# Patient Record
Sex: Male | Born: 1985
Health system: Southern US, Academic
[De-identification: ages and names within clinical notes are randomized; demographics above are authoritative.]

## PROBLEM LIST (undated history)

## (undated) ENCOUNTER — Encounter

## (undated) ENCOUNTER — Telehealth

## (undated) ENCOUNTER — Ambulatory Visit: Payer: PRIVATE HEALTH INSURANCE | Attending: "Endocrinology | Primary: "Endocrinology

## (undated) ENCOUNTER — Ambulatory Visit: Payer: PRIVATE HEALTH INSURANCE

## (undated) ENCOUNTER — Ambulatory Visit

## (undated) ENCOUNTER — Ambulatory Visit: Payer: PRIVATE HEALTH INSURANCE | Attending: Registered" | Primary: Registered"

## (undated) ENCOUNTER — Ambulatory Visit: Attending: Pharmacist | Primary: Pharmacist

## (undated) ENCOUNTER — Ambulatory Visit: Payer: PRIVATE HEALTH INSURANCE | Attending: Hematology | Primary: Hematology

## (undated) ENCOUNTER — Encounter: Attending: Pulmonary Disease | Primary: Pulmonary Disease

## (undated) ENCOUNTER — Encounter: Attending: Pharmacist | Primary: Pharmacist

## (undated) ENCOUNTER — Encounter: Attending: Medical | Primary: Medical

## (undated) ENCOUNTER — Telehealth: Attending: Internal Medicine | Primary: Internal Medicine

## (undated) ENCOUNTER — Ambulatory Visit: Attending: Medical | Primary: Medical

## (undated) ENCOUNTER — Ambulatory Visit: Payer: PRIVATE HEALTH INSURANCE | Attending: Medical | Primary: Medical

## (undated) ENCOUNTER — Encounter: Attending: "Endocrinology | Primary: "Endocrinology

## (undated) ENCOUNTER — Telehealth: Attending: Pharmacist | Primary: Pharmacist

## (undated) ENCOUNTER — Ambulatory Visit: Payer: PRIVATE HEALTH INSURANCE | Attending: Pharmacist | Primary: Pharmacist

## (undated) ENCOUNTER — Telehealth
Attending: Student in an Organized Health Care Education/Training Program | Primary: Student in an Organized Health Care Education/Training Program

## (undated) ENCOUNTER — Ambulatory Visit: Attending: "Endocrinology | Primary: "Endocrinology

## (undated) ENCOUNTER — Encounter: Attending: Registered" | Primary: Registered"

## (undated) ENCOUNTER — Telehealth: Attending: Medical | Primary: Medical

## (undated) ENCOUNTER — Telehealth: Attending: Registered" | Primary: Registered"

## (undated) ENCOUNTER — Ambulatory Visit: Payer: PRIVATE HEALTH INSURANCE | Attending: Pulmonary Disease | Primary: Pulmonary Disease

## (undated) ENCOUNTER — Encounter
Attending: Student in an Organized Health Care Education/Training Program | Primary: Student in an Organized Health Care Education/Training Program

## (undated) ENCOUNTER — Ambulatory Visit
Payer: PRIVATE HEALTH INSURANCE | Attending: Student in an Organized Health Care Education/Training Program | Primary: Student in an Organized Health Care Education/Training Program

## (undated) ENCOUNTER — Telehealth: Payer: PRIVATE HEALTH INSURANCE | Attending: "Endocrinology | Primary: "Endocrinology

## (undated) ENCOUNTER — Encounter: Attending: Internal Medicine | Primary: Internal Medicine

## (undated) ENCOUNTER — Institutional Professional Consult (permissible substitution): Payer: PRIVATE HEALTH INSURANCE

## (undated) ENCOUNTER — Ambulatory Visit: Payer: PRIVATE HEALTH INSURANCE | Attending: Family | Primary: Family

## (undated) ENCOUNTER — Inpatient Hospital Stay

## (undated) ENCOUNTER — Ambulatory Visit: Payer: PRIVATE HEALTH INSURANCE | Attending: Nutritionist | Primary: Nutritionist

## (undated) ENCOUNTER — Telehealth: Attending: Pulmonary Disease | Primary: Pulmonary Disease

## (undated) ENCOUNTER — Ambulatory Visit: Payer: PRIVATE HEALTH INSURANCE | Attending: Audiologist | Primary: Audiologist

## (undated) ENCOUNTER — Telehealth: Attending: Primary Care | Primary: Primary Care

## (undated) ENCOUNTER — Ambulatory Visit: Payer: PRIVATE HEALTH INSURANCE | Attending: Dermatology | Primary: Dermatology

## (undated) DIAGNOSIS — E119 Type 2 diabetes mellitus without complications: Secondary | ICD-10-CM

## (undated) DIAGNOSIS — J45909 Unspecified asthma, uncomplicated: Secondary | ICD-10-CM

## (undated) DIAGNOSIS — Z86718 Personal history of other venous thrombosis and embolism: Secondary | ICD-10-CM

## (undated) DIAGNOSIS — K8689 Other specified diseases of pancreas: Secondary | ICD-10-CM

## (undated) DIAGNOSIS — G4730 Sleep apnea, unspecified: Secondary | ICD-10-CM

## (undated) DIAGNOSIS — J189 Pneumonia, unspecified organism: Secondary | ICD-10-CM

## (undated) DIAGNOSIS — I10 Essential (primary) hypertension: Secondary | ICD-10-CM

## (undated) DIAGNOSIS — Z87442 Personal history of urinary calculi: Secondary | ICD-10-CM

## (undated) HISTORY — PX: NASAL SINUS SURGERY: SHX719

## (undated) MED ORDER — HYDROCODONE 5 MG-ACETAMINOPHEN 325 MG TABLET: Freq: Four times a day (QID) | ORAL | 0.00000 days | PRN

## (undated) MED ORDER — MUPIROCIN 2 % TOPICAL OINTMENT: Freq: Three times a day (TID) | TOPICAL | 0 days | Status: SS

## (undated) MED ORDER — OMEPRAZOLE 20 MG CAPSULE,DELAYED RELEASE: Freq: Every day | ORAL | 0.00000 days | Status: SS

## (undated) MED ORDER — GABAPENTIN ER 600 MG TABLET,EXTENDED RELEASE 24 HR: Freq: Every day | ORAL | 0.00000 days | Status: SS

## (undated) MED ORDER — BLOOD GLUCOSE TEST STRIPS: Freq: Three times a day (TID) | 0 days

## (undated) MED ORDER — METHOCARBAMOL 750 MG TABLET: 0.00000 days

## (undated) MED ORDER — GABAPENTIN 600 MG TABLET: Freq: Three times a day (TID) | ORAL | 0.00000 days

## (undated) MED ORDER — OXYCODONE 5 MG CAPSULE: ORAL | 0.00000 days | PRN

## (undated) MED ORDER — DOCUSATE SODIUM 100 MG CAPSULE: Freq: Every day | ORAL | 0 days

## (undated) MED ORDER — POLYETHYLENE GLYCOL 3350 17 GRAM ORAL POWDER PACKET: Freq: Every day | ORAL | 0.00000 days

---
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
## 2016-03-16 DIAGNOSIS — F329 Major depressive disorder, single episode, unspecified: Secondary | ICD-10-CM

## 2016-03-16 DIAGNOSIS — I10 Essential (primary) hypertension: Secondary | ICD-10-CM

## 2016-03-16 DIAGNOSIS — E109 Type 1 diabetes mellitus without complications: Secondary | ICD-10-CM

## 2016-03-16 DIAGNOSIS — Z86718 Personal history of other venous thrombosis and embolism: Secondary | ICD-10-CM

## 2016-03-16 DIAGNOSIS — F419 Anxiety disorder, unspecified: Secondary | ICD-10-CM

## 2016-03-17 DIAGNOSIS — E669 Obesity, unspecified: Secondary | ICD-10-CM

## 2016-03-17 DIAGNOSIS — E109 Type 1 diabetes mellitus without complications: Secondary | ICD-10-CM

## 2016-03-17 DIAGNOSIS — Z86718 Personal history of other venous thrombosis and embolism: Secondary | ICD-10-CM

## 2016-07-28 ENCOUNTER — Encounter (HOSPITAL_COMMUNITY): Payer: Self-pay | Admitting: *Deleted

## 2016-07-28 ENCOUNTER — Emergency Department (HOSPITAL_COMMUNITY): Payer: BLUE CROSS/BLUE SHIELD

## 2016-07-28 ENCOUNTER — Inpatient Hospital Stay (HOSPITAL_COMMUNITY)
Admission: EM | Admit: 2016-07-28 | Discharge: 2016-07-31 | DRG: 178 | Disposition: A | Discharge status: Home Health | Payer: BLUE CROSS/BLUE SHIELD | Attending: Internal Medicine | Admitting: Internal Medicine

## 2016-07-28 ENCOUNTER — Other Ambulatory Visit: Payer: Self-pay

## 2016-07-28 DIAGNOSIS — R52 Pain, unspecified: Secondary | ICD-10-CM

## 2016-07-28 DIAGNOSIS — J471 Bronchiectasis with (acute) exacerbation: Secondary | ICD-10-CM | POA: Diagnosis present

## 2016-07-28 DIAGNOSIS — Z881 Allergy status to other antibiotic agents status: Secondary | ICD-10-CM

## 2016-07-28 DIAGNOSIS — Z7901 Long term (current) use of anticoagulants: Secondary | ICD-10-CM

## 2016-07-28 DIAGNOSIS — I10 Essential (primary) hypertension: Secondary | ICD-10-CM | POA: Diagnosis present

## 2016-07-28 DIAGNOSIS — Z86718 Personal history of other venous thrombosis and embolism: Secondary | ICD-10-CM

## 2016-07-28 DIAGNOSIS — R739 Hyperglycemia, unspecified: Secondary | ICD-10-CM

## 2016-07-28 DIAGNOSIS — Z888 Allergy status to other drugs, medicaments and biological substances status: Secondary | ICD-10-CM

## 2016-07-28 DIAGNOSIS — E119 Type 2 diabetes mellitus without complications: Secondary | ICD-10-CM | POA: Diagnosis present

## 2016-07-28 DIAGNOSIS — G2581 Restless legs syndrome: Secondary | ICD-10-CM | POA: Diagnosis present

## 2016-07-28 DIAGNOSIS — Z91018 Allergy to other foods: Secondary | ICD-10-CM

## 2016-07-28 DIAGNOSIS — F329 Major depressive disorder, single episode, unspecified: Secondary | ICD-10-CM | POA: Diagnosis present

## 2016-07-28 LAB — CBC WITH DIFFERENTIAL/PLATELET
Basophils Absolute: 0 10*3/uL (ref 0.0–0.1)
Basophils Relative: 1 %
Eosinophils Absolute: 0.4 10*3/uL (ref 0.0–0.7)
Eosinophils Relative: 5 %
HEMATOCRIT: 41.5 % (ref 39.0–52.0)
Hemoglobin: 13.5 g/dL (ref 13.0–17.0)
LYMPHS PCT: 29 %
Lymphs Abs: 2.3 10*3/uL (ref 0.7–4.0)
MCH: 26 pg (ref 26.0–34.0)
MCHC: 32.5 g/dL (ref 30.0–36.0)
MCV: 79.8 fL (ref 78.0–100.0)
MONO ABS: 0.9 10*3/uL (ref 0.1–1.0)
MONOS PCT: 11 %
NEUTROS ABS: 4.2 10*3/uL (ref 1.7–7.7)
Neutrophils Relative %: 54 %
Platelets: 383 10*3/uL (ref 150–400)
RBC: 5.2 MIL/uL (ref 4.22–5.81)
RDW: 15.7 % — AB (ref 11.5–15.5)
WBC: 7.7 10*3/uL (ref 4.0–10.5)

## 2016-07-28 LAB — BASIC METABOLIC PANEL
Anion gap: 7 (ref 5–15)
BUN: 13 mg/dL (ref 6–20)
CALCIUM: 9.1 mg/dL (ref 8.9–10.3)
CO2: 24 mmol/L (ref 22–32)
CREATININE: 0.87 mg/dL (ref 0.61–1.24)
Chloride: 105 mmol/L (ref 101–111)
GFR calc Af Amer: >60 mL/min (ref >60)
GFR calc non Af Amer: >60 mL/min (ref >60)
GLUCOSE: 146 mg/dL — AB (ref 65–99)
Potassium: 4.2 mmol/L (ref 3.5–5.1)
Sodium: 136 mmol/L (ref 135–145)

## 2016-07-28 MED ORDER — ALBUTEROL SULFATE (2.5 MG/3ML) 0.083% IN NEBU
INHALATION_SOLUTION | RESPIRATORY_TRACT | Status: AC
  Filled 2016-07-28: qty 3

## 2016-07-28 MED ORDER — ALBUTEROL SULFATE (2.5 MG/3ML) 0.083% IN NEBU
5.0000 mg | INHALATION_SOLUTION | Freq: Once | RESPIRATORY_TRACT | Status: AC
  Administered 2016-07-28: 5 mg via RESPIRATORY_TRACT

## 2016-07-28 NOTE — ED Triage Notes (Signed)
Pt with hx of cystic fibrosis (dx at age 30) is here for URI with productive cough and sob.  Pt was seen by PCP and placed on augmentin, pt has been taking the augmentin for over a week and is not feeling better.  No acute distress in triage, speaking in full sentences.

## 2016-07-29 DIAGNOSIS — R739 Hyperglycemia, unspecified: Secondary | ICD-10-CM

## 2016-07-29 DIAGNOSIS — F329 Major depressive disorder, single episode, unspecified: Secondary | ICD-10-CM | POA: Diagnosis present

## 2016-07-29 DIAGNOSIS — Z91018 Allergy to other foods: Secondary | ICD-10-CM | POA: Diagnosis not present

## 2016-07-29 DIAGNOSIS — J471 Bronchiectasis with (acute) exacerbation: Secondary | ICD-10-CM | POA: Diagnosis present

## 2016-07-29 DIAGNOSIS — G2581 Restless legs syndrome: Secondary | ICD-10-CM | POA: Diagnosis present

## 2016-07-29 DIAGNOSIS — I10 Essential (primary) hypertension: Secondary | ICD-10-CM | POA: Diagnosis present

## 2016-07-29 DIAGNOSIS — R52 Pain, unspecified: Secondary | ICD-10-CM

## 2016-07-29 DIAGNOSIS — Z86718 Personal history of other venous thrombosis and embolism: Secondary | ICD-10-CM | POA: Diagnosis not present

## 2016-07-29 DIAGNOSIS — Z7901 Long term (current) use of anticoagulants: Secondary | ICD-10-CM | POA: Diagnosis not present

## 2016-07-29 DIAGNOSIS — Z881 Allergy status to other antibiotic agents status: Secondary | ICD-10-CM | POA: Diagnosis not present

## 2016-07-29 DIAGNOSIS — Z888 Allergy status to other drugs, medicaments and biological substances status: Secondary | ICD-10-CM | POA: Diagnosis not present

## 2016-07-29 DIAGNOSIS — E119 Type 2 diabetes mellitus without complications: Secondary | ICD-10-CM | POA: Diagnosis present

## 2016-07-29 LAB — EXPECTORATED SPUTUM ASSESSMENT W GRAM STAIN, RFLX TO RESP C

## 2016-07-29 LAB — EXPECTORATED SPUTUM ASSESSMENT W REFEX TO RESP CULTURE

## 2016-07-29 LAB — GLUCOSE, CAPILLARY: Glucose-Capillary: 108 mg/dL — ABNORMAL HIGH (ref 65–99)

## 2016-07-29 LAB — TOBRAMYCIN LEVEL, RANDOM
TOBRAMYCIN RM: 16.8 ug/mL — AB
Tobramycin Rm: 5.6 ug/mL

## 2016-07-29 MED ORDER — ONDANSETRON HCL 4 MG PO TABS
8.0000 mg | ORAL_TABLET | Freq: Two times a day (BID) | ORAL | Status: DC | PRN
  Administered 2016-07-29: 8 mg via ORAL
  Filled 2016-07-29: qty 2

## 2016-07-29 MED ORDER — INSULIN DETEMIR 100 UNIT/ML ~~LOC~~ SOLN
5.0000 [IU] | Freq: Every day | SUBCUTANEOUS | Status: DC
  Administered 2016-07-29 – 2016-07-31 (×3): 5 [IU] via SUBCUTANEOUS
  Filled 2016-07-29 (×3): qty 0.05

## 2016-07-29 MED ORDER — CLONIDINE HCL 0.1 MG PO TABS
0.1000 mg | ORAL_TABLET | Freq: Two times a day (BID) | ORAL | Status: DC
  Administered 2016-07-30 – 2016-07-31 (×4): 0.1 mg via ORAL
  Filled 2016-07-29 (×4): qty 1

## 2016-07-29 MED ORDER — SODIUM CHLORIDE 0.9 % IV SOLN
1.0000 g | Freq: Three times a day (TID) | INTRAVENOUS | Status: DC
  Filled 2016-07-29 (×2): qty 1

## 2016-07-29 MED ORDER — PANTOPRAZOLE SODIUM 40 MG PO TBEC
40.0000 mg | DELAYED_RELEASE_TABLET | Freq: Every day | ORAL | Status: DC
  Administered 2016-07-29 – 2016-07-31 (×3): 40 mg via ORAL
  Filled 2016-07-29 (×3): qty 1

## 2016-07-29 MED ORDER — PANCRELIPASE (LIP-PROT-AMYL) 36000-114000 UNITS PO CPEP
240000.0000 [IU] | ORAL_CAPSULE | Freq: Three times a day (TID) | ORAL | Status: DC
  Administered 2016-07-29 – 2016-07-31 (×6): 240000 [IU] via ORAL
  Filled 2016-07-29 (×9): qty 2

## 2016-07-29 MED ORDER — LORATADINE 10 MG PO TABS
10.0000 mg | ORAL_TABLET | Freq: Every day | ORAL | Status: DC
  Administered 2016-07-29 – 2016-07-31 (×3): 10 mg via ORAL
  Filled 2016-07-29 (×3): qty 1

## 2016-07-29 MED ORDER — PANCRELIPASE (LIP-PROT-AMYL) 12000-38000 UNITS PO CPEP: 108000.0000 [IU] | ORAL_CAPSULE | ORAL | Status: DC | PRN

## 2016-07-29 MED ORDER — PANCRELIPASE (LIP-PROT-AMYL) 12000-38000 UNITS PO CPEP: 120000.0000 [IU] | ORAL_CAPSULE | ORAL | Status: DC | PRN

## 2016-07-29 MED ORDER — LISINOPRIL 5 MG PO TABS
5.0000 mg | ORAL_TABLET | Freq: Every day | ORAL | Status: DC
  Administered 2016-07-29: 5 mg via ORAL
  Filled 2016-07-29: qty 1

## 2016-07-29 MED ORDER — PREGABALIN 75 MG PO CAPS
75.0000 mg | ORAL_CAPSULE | Freq: Every day | ORAL | Status: DC
  Administered 2016-07-29 – 2016-07-31 (×3): 75 mg via ORAL
  Filled 2016-07-29 (×3): qty 1

## 2016-07-29 MED ORDER — DEXTROSE 5 % IV SOLN
830.0000 mg | INTRAVENOUS | Status: DC
  Administered 2016-07-30 – 2016-07-31 (×2): 830 mg via INTRAVENOUS
  Filled 2016-07-29 (×3): qty 20.75

## 2016-07-29 MED ORDER — RIVAROXABAN 20 MG PO TABS
20.0000 mg | ORAL_TABLET | Freq: Every day | ORAL | Status: DC
  Administered 2016-07-29 – 2016-07-30 (×2): 20 mg via ORAL
  Filled 2016-07-29 (×2): qty 1

## 2016-07-29 MED ORDER — MOMETASONE FURO-FORMOTEROL FUM 200-5 MCG/ACT IN AERO
2.0000 | INHALATION_SPRAY | Freq: Two times a day (BID) | RESPIRATORY_TRACT | Status: DC
  Administered 2016-07-29 – 2016-07-30 (×4): 2 via RESPIRATORY_TRACT
  Filled 2016-07-29: qty 8.8

## 2016-07-29 MED ORDER — ALBUTEROL SULFATE (2.5 MG/3ML) 0.083% IN NEBU
INHALATION_SOLUTION | RESPIRATORY_TRACT | Status: AC
  Administered 2016-07-29: 2.5 mg
  Filled 2016-07-29: qty 3

## 2016-07-29 MED ORDER — TOBRAMYCIN 300 MG/5ML IN NEBU
300.0000 mg | INHALATION_SOLUTION | Freq: Two times a day (BID) | RESPIRATORY_TRACT | Status: DC
  Administered 2016-07-29: 300 mg via RESPIRATORY_TRACT
  Filled 2016-07-29 (×3): qty 5

## 2016-07-29 MED ORDER — SODIUM CHLORIDE 0.9 % IV SOLN
2.0000 g | Freq: Three times a day (TID) | INTRAVENOUS | Status: DC
  Administered 2016-07-29 – 2016-07-31 (×7): 2 g via INTRAVENOUS
  Filled 2016-07-29 (×9): qty 2

## 2016-07-29 MED ORDER — IVACAFTOR 150 MG PO TABS
150.0000 mg | ORAL_TABLET | Freq: Two times a day (BID) | ORAL | Status: DC
Start: 2016-07-29 — End: 2016-07-31
  Administered 2016-07-29 – 2016-07-30 (×3): 150 mg via ORAL
  Filled 2016-07-29 (×5): qty 1

## 2016-07-29 MED ORDER — HYDROMORPHONE HCL 2 MG/ML IJ SOLN
0.5000 mg | Freq: Once | INTRAMUSCULAR | Status: AC
  Administered 2016-07-29: 0.5 mg via INTRAVENOUS
  Filled 2016-07-29: qty 1

## 2016-07-29 MED ORDER — IBUPROFEN 400 MG PO TABS
400.0000 mg | ORAL_TABLET | ORAL | Status: DC | PRN
  Administered 2016-07-29: 400 mg via ORAL
  Filled 2016-07-29: qty 1

## 2016-07-29 MED ORDER — VITAMIN D 1000 UNITS PO TABS
4000.0000 [IU] | ORAL_TABLET | Freq: Every day | ORAL | Status: DC
  Administered 2016-07-29 – 2016-07-31 (×3): 4000 [IU] via ORAL
  Filled 2016-07-29 (×3): qty 4

## 2016-07-29 MED ORDER — SODIUM CHLORIDE 7 % IN NEBU
4.0000 mL | INHALATION_SOLUTION | Freq: Two times a day (BID) | RESPIRATORY_TRACT | Status: DC
  Administered 2016-07-29: 20:00:00 via RESPIRATORY_TRACT
  Administered 2016-07-29 – 2016-07-31 (×4): 4 mL via RESPIRATORY_TRACT
  Filled 2016-07-29 (×4): qty 4

## 2016-07-29 MED ORDER — ACETAMINOPHEN-CODEINE #3 300-30 MG PO TABS
1.0000 | ORAL_TABLET | ORAL | Status: DC | PRN
  Administered 2016-07-30 (×3): 2 via ORAL
  Administered 2016-07-30: 1 via ORAL
  Filled 2016-07-29 (×4): qty 2

## 2016-07-29 MED ORDER — ALBUTEROL SULFATE (2.5 MG/3ML) 0.083% IN NEBU
INHALATION_SOLUTION | RESPIRATORY_TRACT | Status: AC
  Filled 2016-07-29: qty 3

## 2016-07-29 MED ORDER — TRAMADOL HCL 50 MG PO TABS
100.0000 mg | ORAL_TABLET | Freq: Three times a day (TID) | ORAL | Status: DC | PRN
  Administered 2016-07-29: 100 mg via ORAL
  Filled 2016-07-29: qty 2

## 2016-07-29 MED ORDER — AZITHROMYCIN 500 MG PO TABS: 500.0000 mg | ORAL_TABLET | ORAL | Status: DC

## 2016-07-29 MED ORDER — TRAZODONE HCL 100 MG PO TABS
100.0000 mg | ORAL_TABLET | Freq: Every evening | ORAL | Status: DC
  Administered 2016-07-29 – 2016-07-30 (×2): 100 mg via ORAL
  Filled 2016-07-29 (×2): qty 1

## 2016-07-29 MED ORDER — MONTELUKAST SODIUM 10 MG PO TABS
10.0000 mg | ORAL_TABLET | Freq: Every day | ORAL | Status: DC
  Administered 2016-07-29 – 2016-07-31 (×3): 10 mg via ORAL
  Filled 2016-07-29 (×3): qty 1

## 2016-07-29 MED ORDER — PAROXETINE HCL 20 MG PO TABS
10.0000 mg | ORAL_TABLET | Freq: Every day | ORAL | Status: DC
  Administered 2016-07-29 – 2016-07-31 (×3): 10 mg via ORAL
  Filled 2016-07-29 (×3): qty 1

## 2016-07-29 MED ORDER — TOBRAMYCIN SULFATE 80 MG/2ML IJ SOLN
930.0000 mg | Freq: Once | INTRAVENOUS | Status: AC
  Administered 2016-07-29: 930 mg via INTRAVENOUS
  Filled 2016-07-29: qty 23.25

## 2016-07-29 MED ORDER — PANCRELIPASE (LIP-PROT-AMYL) 12000-38000 UNITS PO CPEP: 12000.0000 [IU] | ORAL_CAPSULE | ORAL | Status: DC | PRN

## 2016-07-29 MED ORDER — PANCRELIPASE (LIP-PROT-AMYL) 40000-136000 UNITS PO CPEP: 6.0000 | ORAL_CAPSULE | Freq: Three times a day (TID) | ORAL | Status: DC | PRN

## 2016-07-29 MED ORDER — DORNASE ALFA 2.5 MG/2.5ML IN SOLN
2.5000 mg | Freq: Every day | RESPIRATORY_TRACT | Status: DC
  Administered 2016-07-29 – 2016-07-30 (×2): 2.5 mg via RESPIRATORY_TRACT
  Filled 2016-07-29 (×6): qty 2.5

## 2016-07-29 MED ORDER — ALBUTEROL SULFATE (2.5 MG/3ML) 0.083% IN NEBU
1.2500 mg | INHALATION_SOLUTION | Freq: Two times a day (BID) | RESPIRATORY_TRACT | Status: DC
  Administered 2016-07-29: 1.25 mg via RESPIRATORY_TRACT

## 2016-07-29 MED ORDER — ALBUTEROL SULFATE (2.5 MG/3ML) 0.083% IN NEBU: 1.2500 mg | INHALATION_SOLUTION | Freq: Two times a day (BID) | RESPIRATORY_TRACT | Status: DC

## 2016-07-29 MED ORDER — PRAMIPEXOLE DIHYDROCHLORIDE 0.125 MG PO TABS
0.1250 mg | ORAL_TABLET | Freq: Every evening | ORAL | Status: DC
  Administered 2016-07-29 – 2016-07-30 (×2): 0.125 mg via ORAL
  Filled 2016-07-29 (×3): qty 1

## 2016-07-29 MED ORDER — IVACAFTOR 150 MG PO TABS
150.0000 mg | ORAL_TABLET | Freq: Two times a day (BID) | ORAL | Status: DC
Start: 2016-07-29 — End: 2016-07-29

## 2016-07-29 NOTE — Progress Notes (Signed)
CRITICAL VALUE ALERT  Critical value received:  Tobramycin  Date of notification:  07/29/16  Time of notification:  1315  Critical value read back:Yes.    Nurse who received alert:  Erick AlleySarah Tashema Tiller RN  MD notified (1st page):  Young     Per MD Pharmacy is taking care of this.

## 2016-07-29 NOTE — Progress Notes (Signed)
eLink Physician-Brief Progress Note Patient Name: James Proctor DOB: October 12, 1985 MRN: [id number]   Date of Service  07/29/2016  HPI/Events of Note  Patient c/o total body pain. Creatinine = 0.87.  eICU Interventions  Will order: 1. Motrin 400 mg PO now and Q 4 hours PRN pain.      Intervention Category Intermediate Interventions: Pain - evaluation and management  [name] 07/29/2016, 4:43 AM

## 2016-07-29 NOTE — Progress Notes (Signed)
PULMONARY / CRITICAL CARE MEDICINE   Name: James Proctor MRN: [id number] DOB: 12/06/1985    ADMISSION DATE:  07/28/2016  CHIEF COMPLAINT:  CF exacerbation  HISTORY OF PRESENT ILLNESS:   Mr. James Proctor is a 30 y/o M with cystic fibrosis (3905insT and S945L) on ivacaftor who presented to the ED with several days of worsening chest congestion, cough, and fatigue. His baseline FEV1 is ~80%.  His primary CF center is [facility], but explained that he typically presents to a local ED and then has [facility] coordinate his care with the local center for discharge on home antibiotics for his typical exacerbations. In addition to CF, he states he is on life-long anticoagulation for recurrent DVTs.   Allergies  Allergen Reactions  . [place] [Aztreonam] Anaphylaxis  . Cefepime Itching, Nausea Only and Other (See Comments)    Headaches also  . Tobramycin Tinitus    [facility] since this allergy documented  . Banana Itching and Nausea And Vomiting  . Theophyllines Itching    Itchy throat   Subjective: Complains of tussive chest wall pains, and left thigh pain after fall last week. Says motrin insufficient, uses Lyrica at home w dose pending, and asks what can be taken if Lyrica insufficient.  VITAL SIGNS: BP 119/66 (BP Location: Right Arm)   Pulse 78   Temp 98.5 F (36.9 C) (Oral)   Resp 18   Ht 6' (1.829 m)   Wt 113.4 kg (250 lb 1.6 oz)   SpO2 99%   BMI 33.92 kg/m   INTAKE / OUTPUT: No intake/output data recorded.  PHYSICAL EXAMINATION: General:  Young man in NAD, cooperative Neuro:  Awake, alert, follows commands, speech clear HEENT:  MMM Cardiovascular:  Normal S1/S2 Lungs:  Rhonchi on inspiration mild. Not coughing at my exam, unlabored on room air. R portacath Abdomen:  Soft Musculoskeletal:  No deformities Skin:  No rashes on visible skin  LABS:  BMET  Recent Labs Lab 07/28/16 2003  NA 136  K 4.2  CL 105  CO2 24  BUN 13  CREATININE 0.87   GLUCOSE 146*    Electrolytes  Recent Labs Lab 07/28/16 2003  CALCIUM 9.1    CBC  Recent Labs Lab 07/28/16 2003  WBC 7.7  HGB 13.5  HCT 41.5  PLT 383    Coag's No results for input(s): APTT, INR in the last 168 hours.  Sepsis Markers No results for input(s): LATICACIDVEN, PROCALCITON, O2SATVEN in the last 168 hours.  ABG No results for input(s): PHART, PCO2ART, PO2ART in the last 168 hours.  Liver Enzymes No results for input(s): AST, ALT, ALKPHOS, BILITOT, ALBUMIN in the last 168 hours.  Cardiac Enzymes No results for input(s): TROPONINI, PROBNP in the last 168 hours.  Glucose  Recent Labs Lab 07/29/16 0755  GLUCAP 108*    Imaging Dg Chest 2 View  Result Date: 07/28/2016 CLINICAL DATA:  Initial evaluation for acute shortness of breath, cough. History of cystic fibrosis. EXAM: CHEST  2 VIEW COMPARISON:  Prior radiograph from 03/16/2016. FINDINGS: Right-sided Port-A-Cath in place, stable. Cardiac and mediastinal silhouettes are unchanged. Lung volumes are mildly reduced. No pulmonary edema or pleural effusion. Architectural distortion with bronchiectasis at the medial right lung base is similar to prior, likely chronic. No other focal infiltrates identified. No pneumothorax. No acute osseous abnormality. No acute osseous abnormality. IMPRESSION: 1. Similar appearance of architectural distortion with bronchiectasis at the medial right upper lobe, likely chronic. 2. No other active cardiopulmonary disease identified.  Electronically Signed   By: Rise MuBenjamin  McClintock M.D.   On: 07/28/2016 20:46    ASSESSMENT / PLAN: From admission assessment: 1. Cystic Fibrosis Associated Bronchiectasis with current exacerbation: [facility], typically treated with meropenem and tobramycin for pseudomonas as well as Staph A. Will treat with these, and will coordinate with pharmacy for extended-interval (high peak) tobramycin. Will coordinate with [facility] on Tuesday to  arrange local IVs. The patient took his ivacaftor this evening, but I instructed his family member to retrieve this medication (not on hospital formulary) so he can continue it without interruptions. Hold azithro while on IV tobra. - PFTs. - continue home inhaled therapies (HS, dornase, etc) - Chest physiotherapy q4hr while awake  2. Pain management: He refers tosore L leg after fall last week, tussive chest pain, but is on Lyrica, indicating chronic pain issues. Need to watch this. Consider tramadol beyond Lyrica if more needed here.  3. Elevated initial blood glucose. For repeat labs.   Jamie KatoAaron Trimble, MD Pulmonary and Critical Care Medicine [facility] Pager: [phone]  07/29/2016, 9:30 AM

## 2016-07-29 NOTE — Progress Notes (Signed)
Patient refused albuterol treatment. Sputum cup left at bedside and patient instructed on use.

## 2016-07-29 NOTE — H&P (Signed)
PULMONARY / CRITICAL CARE MEDICINE   Name: James Proctor MRN: [id number] DOB: 1985-09-23    ADMISSION DATE:  07/28/2016  CHIEF COMPLAINT:  CF exacerbation  HISTORY OF PRESENT ILLNESS:   James Proctor is a 30 y/o M with cystic fibrosis (3905insT and S945L) on ivacaftor who presented to the ED with several days of worsening chest congestion, cough, and fatigue. His baseline FEV1 is ~80%.  His primary CF center is [facility], but explained that he typically presents to a local ED and then has [facility] coordinate his care with the local center for discharge on home antibiotics for his typical exacerbations. In addition to CF, he states he is on life-long anticoagulation for recurrent DVTs.  PAST MEDICAL HISTORY :  He  has no past medical history on file.  PAST SURGICAL HISTORY: He  has no past surgical history on file.  Allergies  Allergen Reactions  . [place] [Aztreonam] Anaphylaxis  . Cefepime Itching, Nausea Only and Other (See Comments)    Headaches also  . Tobramycin Tinitus  . Banana Itching and Nausea And Vomiting  . Theophyllines Itching    Itchy throat    No current facility-administered medications on file prior to encounter.    No current outpatient prescriptions on file prior to encounter.    FAMILY HISTORY:  His has no family status information on file.    SOCIAL HISTORY: He  reports that he has never smoked. He has never used smokeless tobacco. He reports that he does not drink alcohol.  REVIEW OF SYSTEMS:   Per HPI  VITAL SIGNS: BP 132/81 (BP Location: Right Arm)   Pulse 92   Temp 98.3 F (36.8 C) (Oral)   Resp 18   Wt 254 lb (115.2 kg)   SpO2 99%   INTAKE / OUTPUT: No intake/output data recorded.  PHYSICAL EXAMINATION: General:  Young man in NAD Neuro:  Awake, alert HEENT:  MMM Cardiovascular:  Normal S1/S2 Lungs:  Rhonchi on inspiration Abdomen:  Soft Musculoskeletal:  No deformities Skin:  No rashes on visible skin  LABS:  BMET  Recent  Labs Lab 07/28/16 2003  NA 136  K 4.2  CL 105  CO2 24  BUN 13  CREATININE 0.87  GLUCOSE 146*    Electrolytes  Recent Labs Lab 07/28/16 2003  CALCIUM 9.1    CBC  Recent Labs Lab 07/28/16 2003  WBC 7.7  HGB 13.5  HCT 41.5  PLT 383    Coag's No results for input(s): APTT, INR in the last 168 hours.  Sepsis Markers No results for input(s): LATICACIDVEN, PROCALCITON, O2SATVEN in the last 168 hours.  ABG No results for input(s): PHART, PCO2ART, PO2ART in the last 168 hours.  Liver Enzymes No results for input(s): AST, ALT, ALKPHOS, BILITOT, ALBUMIN in the last 168 hours.  Cardiac Enzymes No results for input(s): TROPONINI, PROBNP in the last 168 hours.  Glucose No results for input(s): GLUCAP in the last 168 hours.  Imaging Dg Chest 2 View  Result Date: 07/28/2016 CLINICAL DATA:  Initial evaluation for acute shortness of breath, cough. History of cystic fibrosis. EXAM: CHEST  2 VIEW COMPARISON:  Prior radiograph from 03/16/2016. FINDINGS: Right-sided Port-A-Cath in place, stable. Cardiac and mediastinal silhouettes are unchanged. Lung volumes are mildly reduced. No pulmonary edema or pleural effusion. Architectural distortion with bronchiectasis at the medial right lung base is similar to prior, likely chronic. No other focal infiltrates identified. No pneumothorax. No acute osseous abnormality. No acute osseous abnormality. IMPRESSION: 1. Similar appearance  of architectural distortion with bronchiectasis at the medial right upper lobe, likely chronic. 2. No other active cardiopulmonary disease identified. Electronically Signed   By: Rise MuBenjamin  McClintock M.D.   On: 07/28/2016 20:46    ASSESSMENT / PLAN:  1. Cystic Fibrosis Associated Bronchiectasis with current exacerbation: [facility], typically treated with meropenem and tobramycin for pseudomonas as well as Staph A. Will treat with these, and will coordinate with pharmacy for extended-interval  (high peak) tobramycin. Will coordinate with [facility] on Tuesday to arrange local IVs. The patient took his ivacaftor this evening, but I instructed his family member to retrieve this medication (not on hospital formulary) so he can continue it without interruptions. Hold azithro while on IV tobra. - PFTs. - continue home inhaled therapies (HS, dornase, etc) - Chest physiotherapy q4hr while awake   Jamie KatoAaron Jasilyn Holderman, MD Pulmonary and Critical Care Medicine [facility] Pager: [phone]  07/29/2016, 4:03 AM

## 2016-07-29 NOTE — ED Provider Notes (Signed)
[facility]-EMERGENCY DEPT Provider Note   CSN: [id number] Arrival date & time: 07/28/16  1932     History   Chief Complaint Chief Complaint  Patient presents with  . Shortness of Breath    HPI James Proctor is a 30 y.o. male.  Patient with a history of cystic fibrosis and HTN presents with SOB that is worse on exertion and CP with cough and breathing, cough productive of grayish/tan phlegm, fever with Tmax at home of 101 x 1 week. He was started on Augmentin at the time symptoms started but reports there has been no improvement. No vomiting, change in appetite. [place] and he [facility], last admission was June of this year.    The history is provided by the patient and the spouse. No language interpreter was used.    History reviewed. No pertinent past medical history.  There are no active problems to display for this patient.   History reviewed. No pertinent surgical history.     Home Medications    Prior to Admission medications   Not on File    Family History No family history on file.  Social History Social History  Substance Use Topics  . Smoking status: Never Smoker  . Smokeless tobacco: Never Used  . Alcohol use No     Allergies   Patient has no known allergies.   Review of Systems Review of Systems  Constitutional: Positive for chills and fever.  HENT: Negative.  Negative for congestion.   Respiratory: Positive for cough and shortness of breath.   Cardiovascular: Positive for chest pain.  Gastrointestinal: Negative.  Negative for nausea and vomiting.  Musculoskeletal: Negative.   Skin: Negative.  Negative for rash.  Neurological: Negative.  Negative for weakness.     Physical Exam Updated Vital Signs BP 159/87   Pulse 93   Temp 98.3 F (36.8 C) (Oral)   Resp 12   Wt 115.2 kg   SpO2 99%   Physical Exam  Constitutional: He is oriented to person, place, and time. He appears well-developed  and well-nourished.  HENT:  Head: Normocephalic.  Nose: Nose normal.  Voice hoarse.  Eyes: Conjunctivae are normal.  Neck: Normal range of motion. Neck supple.  Cardiovascular: Normal rate and regular rhythm.   Pulmonary/Chest: Effort normal.  Right greater than left rhonchi. Air movement to all fields. Normal effort.   Abdominal: Soft. Bowel sounds are normal. There is no tenderness. There is no rebound and no guarding.  Musculoskeletal: Normal range of motion.  Neurological: He is alert and oriented to person, place, and time.  Skin: Skin is warm and dry. No rash noted.  Psychiatric: He has a normal mood and affect.     ED Treatments / Results  Labs (all labs ordered are listed, but only abnormal results are displayed) Labs Reviewed  CBC WITH DIFFERENTIAL/PLATELET - Abnormal; Notable for the following:       Result Value   RDW 15.7 (*)    All other components within normal limits  BASIC METABOLIC PANEL - Abnormal; Notable for the following:    Glucose, Bld 146 (*)    All other components within normal limits    EKG  EKG Interpretation None       Radiology Dg Chest 2 View  Result Date: 07/28/2016 CLINICAL DATA:  Initial evaluation for acute shortness of breath, cough. History of cystic fibrosis. EXAM: CHEST  2 VIEW COMPARISON:  Prior radiograph from 03/16/2016.  FINDINGS: Right-sided Port-A-Cath in place, stable. Cardiac and mediastinal silhouettes are unchanged. Lung volumes are mildly reduced. No pulmonary edema or pleural effusion. Architectural distortion with bronchiectasis at the medial right lung base is similar to prior, likely chronic. No other focal infiltrates identified. No pneumothorax. No acute osseous abnormality. No acute osseous abnormality. IMPRESSION: 1. Similar appearance of architectural distortion with bronchiectasis at the medial right upper lobe, likely chronic. 2. No other active cardiopulmonary disease identified. Electronically Signed   By:  Rise MuBenjamin  McClintock M.D.   On: 07/28/2016 20:46    Procedures Procedures (including critical care time)  Medications Ordered in ED Medications  albuterol (PROVENTIL) (2.5 MG/3ML) 0.083% nebulizer solution 5 mg (5 mg Nebulization Given 07/28/16 2004)     Initial Impression / Assessment and Plan / ED Course  I have reviewed the triage vital signs and the nursing notes.  Pertinent labs & imaging results that were available during my care of the patient were reviewed by me and considered in my medical decision making (see chart for details).  Clinical Course     Patient with CF presents with respiratory illness x 1 week, persistent despite Augmentin use. Continues to have chest pain, SOB, DOE.  CXR shows chronic changes. Discussed with pulmonary who sees the patient in the ED and assumes care. Admitted to pulmonary.  Final Clinical Impressions(s) / ED Diagnoses   Final diagnoses:  None   1. Cystic fibrosis 2. Febrile respiratory illness New Prescriptions New Prescriptions   No medications on file     Elpidio AnisShari Amario Longmore, PA-C 07/29/16 0149    Gilda Creasehristopher J Pollina, MD 07/29/16 [phone]

## 2016-07-29 NOTE — Progress Notes (Signed)
Pharmacy Antibiotic Note  James Proctor is a 30 y.o. male admitted on 07/28/2016 with CF exacerbation.  Pharmacy has been consulted for Meropenem and Tobramycin dosing. Noted pt also on Tobramycin nebs.  [facility] MD requested extended interval dosing for Tobramycin  Noted that pt has received Meropenem 2gm IV q8h and Tobramycin 825mg  q24h during past [facility] admissions (last time receiving Tobramycin IV was March 2017). Allergy/intolerance of tinnitus noted with tobramycin but [facility] since this intolerance noted  Utilizing this protocol for dosing based on levels: https://www.nebraskamed.com/sites/default/files/documents/for-providers/asp/tnmc-cf-eiad.pdf Goal peak 20-30 mcg/ml and goal trough < 631mcg/ml for extended interval in CF patients  Plan: Meropenem 1gm IV q8h Tobramycin 930 mg IV now (10mg /kg adjusted body weight) Will obtain two serum concentrations after first dose (1 and 5 hour after end of infusion) and use these to calculate pharmacokinetics Will f/u micro data, renal function, and pt's clinical condition   Height: 6' (182.9 cm) Weight: 254 lb (115.2 kg) IBW/kg (Calculated) : 77.6  Tobramycin dosing wt: 93 kg  Temp (24hrs), Avg:98.8 F (37.1 C), Min:98.3 F (36.8 C), Max:99.9 F (37.7 C)   Recent Labs Lab 07/28/16 2003  WBC 7.7  CREATININE 0.87    Estimated Creatinine Clearance: 165.6 mL/min (by C-G formula based on SCr of 0.87 mg/dL).    Allergies  Allergen Reactions  . [place] [Aztreonam] Anaphylaxis  . Cefepime Itching, Nausea Only and Other (See Comments)    Headaches also  . Tobramycin Tinitus  . Banana Itching and Nausea And Vomiting  . Theophyllines Itching    Itchy throat    Antimicrobials this admission: PTA tobi nebs >>  12/17 Tobramycin >>  12/17 Meropenem >>  Dose adjustments this admission: n/a  Microbiology results: Pending  Thank you for allowing pharmacy to be a part of this patient's care.  Christoper Fabianaron  Teiana Hajduk, PharmD, BCPS Clinical pharmacist, pager [phone] 07/29/2016 4:14 AM

## 2016-07-29 NOTE — Discharge Instructions (Signed)
Rivaroxaban oral tablets What is this medicine? RIVAROXABAN (ri va ROX a ban) is an anticoagulant (blood thinner). It is used to treat blood clots in the lungs or in the veins. It is also used after knee or hip surgeries to prevent blood clots. It is also used to lower the chance of stroke in people with a medical condition called atrial fibrillation. COMMON BRAND NAME(S): Xarelto, Xarelto Starter Pack What should I tell my health care provider before I take this medicine? They need to know if you have any of these conditions: -bleeding disorders -bleeding in the brain -blood in your stools (black or tarry stools) or if you have blood in your vomit -history of stomach bleeding -kidney disease -liver disease -low blood counts, like low white cell, platelet, or red cell counts -recent or planned spinal or epidural procedure -take medicines that treat or prevent blood clots -an unusual or allergic reaction to rivaroxaban, other medicines, foods, dyes, or preservatives -pregnant or trying to get pregnant -breast-feeding How should I use this medicine? Take this medicine by mouth with a glass of water. Follow the directions on the prescription label. Take your medicine at regular intervals. Do not take it more often than directed. Do not stop taking except on your doctor's advice. Stopping this medicine may increase your risk of a blood clot. Be sure to refill your prescription before you run out of medicine. If you are taking this medicine after hip or knee replacement surgery, take it with or without food. If you are taking this medicine for atrial fibrillation, take it with your evening meal. If you are taking this medicine to treat blood clots, take it with food at the same time each day. If you are unable to swallow your tablet, you may crush the tablet and mix it in applesauce. Then, immediately eat the applesauce. You should eat more food right after you eat the applesauce containing the  crushed tablet. Talk to your pediatrician regarding the use of this medicine in children. Special care may be needed. What if I miss a dose? If you take your medicine once a day and miss a dose, take the missed dose as soon as you remember. If you take your medicine twice a day and miss a dose, take the missed dose immediately. In this instance, 2 tablets may be taken at the same time. The next day you should take 1 tablet twice a day as directed. What may interact with this medicine? Do not take this medicine with any of the following medications: -defibrotideThis medicine may also interact with the following medications: -aspirin and aspirin-like medicines -certain antibiotics like erythromycin, azithromycin, and clarithromycin -certain medicines for fungal infections like ketoconazole and itraconazole -certain medicines for irregular heart beat like amiodarone, quinidine, dronedarone -certain medicines for seizures like carbamazepine, phenytoin -certain medicines that treat or prevent blood clots like warfarin, enoxaparin, and dalteparin -conivaptan -diltiazem -felodipine -indinavir -lopinavir; ritonavir -NSAIDS, medicines for pain and inflammation, like ibuprofen or naproxen -ranolazine -rifampin -ritonavir -SNRIs, medicines for depression, like desvenlafaxine, duloxetine, levomilnacipran, venlafaxine -SSRIs, medicines for depression, like citalopram, escitalopram, fluoxetine, fluvoxamine, paroxetine, sertraline -St. John's wort -verapamil What should I watch for while using this medicine? Visit your doctor or health care professional for regular checks on your progress. Your condition will be monitored carefully while you are receiving this medicine. Notify your doctor or health care professional and seek emergency treatment if you develop breathing problems; changes in vision; chest pain; severe, sudden headache; pain, swelling, warmth in  the leg; trouble speaking; sudden numbness  or weakness of the face, arm, or leg. These can be signs that your condition has gotten worse. If you are going to have surgery, tell your doctor or health care professional that you are taking this medicine. Tell your health care professional that you use this medicine before you have a spinal or epidural procedure. Sometimes people who take this medicine have bleeding problems around the spine when they have a spinal or epidural procedure. This bleeding is very rare. If you have a spinal or epidural procedure while on this medicine, call your health care professional immediately if you have back pain, numbness or tingling (especially in your legs and feet), muscle weakness, paralysis, or loss of bladder or bowel control. Avoid sports and activities that might cause injury while you are using this medicine. Severe falls or injuries can cause unseen bleeding. Be careful when using sharp tools or knives. Consider using an Neurosurgeonelectric razor. Take special care brushing or flossing your teeth. Report any injuries, bruising, or red spots on the skin to your doctor or health care professional. What side effects may I notice from receiving this medicine? Side effects that you should report to your doctor or health care professional as soon as possible: -allergic reactions like skin rash, itching or hives, swelling of the face, lips, or tongue -back pain -redness, blistering, peeling or loosening of the skin, including inside the mouth -signs and symptoms of bleeding such as bloody or black, tarry stools; red or dark-brown urine; spitting up blood or brown material that looks like coffee grounds; red spots on the skin; unusual bruising or bleeding from the eye, gums, or nose Side effects that usually do not require medical attention (report to your doctor or health care professional if they continue or are bothersome): -dizziness -muscle pain Where should I keep my medicine? Keep out of the reach of  children. Store at room temperature between 15 and 30 degrees C (59 and 86 degrees F). Throw away any unused medicine after the expiration date.

## 2016-07-29 NOTE — Progress Notes (Signed)
eLink Physician-Brief Progress Note Patient Name: James Proctor DOB: 11/22/1985 MRN: [id number]   Date of Service  07/29/2016  HPI/Events of Note  Pt c/o cough on acei and severe pain diffusely when coughing  eICU Interventions  D/c acei > rx clonidine D/c tramadol > rx tyl #3 1-2 q 4 h prn     Intervention Category Major Interventions: Hypertension - evaluation and management  Sandrea HughsMichael Priscilla Kirstein 07/29/2016, 9:43 PM

## 2016-07-29 NOTE — Progress Notes (Signed)
Pharmacy Antibiotic Note James Proctor is a 30 y.o. male admitted on 07/28/2016 with CF exacerbation.  Pharmacy has been consulted for Meropenem and Tobramycin dosing. Noted pt also on Tobramycin nebs.  CCM MD requested extended interval dosing for Tobramycin  Noted that pt has received Meropenem 2gm IV q8h and Tobramycin 825mg  q24h during past [facility] admissions (last time receiving Tobramycin IV was March 2017). Allergy/intolerance of tinnitus noted with tobramycin but pt has [facility] since this intolerance noted  Utilizing this protocol for dosing based on levels: https://www.nebraskamed.com/sites/default/files/documents/for-providers/asp/tnmc-cf-eiad.pdf Goal peak 20-30 mcg/ml and goal trough < 641mcg/ml for extended interval in CF patients  Pharmacokinetic Monitoring: Tobramycin 930 mg (~10 mg/kg adjusted body weight), given at 12/17 @ 0724 Level at 1155 = 16.8 and level at 1610 = 5.6 Ke = 0.2746, Half-life = 2.5 hrs Cmax = 38.29 mcg/mL (goal 20-30 mcg/mL)   Plan: - Decrease tobramycin to 830 mg IV q24h (~10% decrease) - Check 1 hr level and 5 hr level after the end of tobramycin infusion on 12/18 - Continue meropenem 2g IV q8h - Monitor renal function, C&S and length of therapy  Height: 6' (182.9 cm) Weight: 250 lb 1.6 oz (113.4 kg) IBW/kg (Calculated) : 77.6  Dosing weight = 91.9 kg  Temp (24hrs), Avg:98.8 F (37.1 C), Min:98.3 F (36.8 C), Max:99.9 F (37.7 C)   Recent Labs Lab 07/28/16 2003 07/29/16 1155 07/29/16 1610  WBC 7.7  --   --   CREATININE 0.87  --   --   TOBRARND  --  16.8* 5.6    Estimated Creatinine Clearance: 164.3 mL/min (by C-G formula based on SCr of 0.87 mg/dL).    Allergies  Allergen Reactions  . Cayston [Aztreonam] Anaphylaxis  . Cefepime Itching, Nausea Only and Other (See Comments)    Headaches also  . Tobramycin Tinitus    [facility] since this allergy documented  . Banana Itching and  Nausea And Vomiting  . Theophyllines Itching    Itchy throat    Antimicrobials this admission: Meropenem 12/17 >>  Tobramycin IV 12/17 >>  Tobramycin nebs  Dose adjustments this admission: Tobramycin 930 mg (~10 mg/kg adjusted body weight), given at 12/17 @ 0724 Level at 1155 = 16.8 and level at 1610 = 5.6 - Decrease dose to 830 mg IV q24h  Microbiology results: 12/17 Sputum: Few gram negative coccobacilli, Rare gram positive cocci   Thank you for allowing pharmacy to be a part of this patient's care.  Casilda Carlsaylor Aamna Mallozzi, PharmD, BCPS PGY-2 Infectious Diseases Pharmacy Resident Pager: [phone] 07/29/2016 6:59 PM

## 2016-07-30 LAB — COMPREHENSIVE METABOLIC PANEL
ALBUMIN: 3.5 g/dL (ref 3.5–5.0)
ALT: 43 U/L (ref 17–63)
ANION GAP: 7 (ref 5–15)
AST: 30 U/L (ref 15–41)
Alkaline Phosphatase: 73 U/L (ref 38–126)
BUN: 14 mg/dL (ref 6–20)
CO2: 27 mmol/L (ref 22–32)
Calcium: 8.7 mg/dL — ABNORMAL LOW (ref 8.9–10.3)
Chloride: 102 mmol/L (ref 101–111)
Creatinine, Ser: 1.02 mg/dL (ref 0.61–1.24)
GFR calc Af Amer: >60 mL/min (ref >60)
GFR calc non Af Amer: >60 mL/min (ref >60)
GLUCOSE: 114 mg/dL — AB (ref 65–99)
POTASSIUM: 4.7 mmol/L (ref 3.5–5.1)
SODIUM: 136 mmol/L (ref 135–145)
Total Bilirubin: 0.8 mg/dL (ref 0.3–1.2)
Total Protein: 6.4 g/dL — ABNORMAL LOW (ref 6.5–8.1)

## 2016-07-30 LAB — CBC WITH DIFFERENTIAL/PLATELET
BASOS ABS: 0 10*3/uL (ref 0.0–0.1)
BASOS PCT: 1 %
EOS ABS: 0.4 10*3/uL (ref 0.0–0.7)
Eosinophils Relative: 6 %
HEMATOCRIT: 39.4 % (ref 39.0–52.0)
HEMOGLOBIN: 12.8 g/dL — AB (ref 13.0–17.0)
Lymphocytes Relative: 11 %
Lymphs Abs: 0.7 10*3/uL (ref 0.7–4.0)
MCH: 26.6 pg (ref 26.0–34.0)
MCHC: 32.5 g/dL (ref 30.0–36.0)
MCV: 81.9 fL (ref 78.0–100.0)
MONO ABS: 1 10*3/uL (ref 0.1–1.0)
MONOS PCT: 15 %
NEUTROS ABS: 4.4 10*3/uL (ref 1.7–7.7)
NEUTROS PCT: 67 %
Platelets: 296 10*3/uL (ref 150–400)
RBC: 4.81 MIL/uL (ref 4.22–5.81)
RDW: 16.3 % — AB (ref 11.5–15.5)
WBC: 6.5 10*3/uL (ref 4.0–10.5)

## 2016-07-30 LAB — GLUCOSE, CAPILLARY: Glucose-Capillary: 138 mg/dL — ABNORMAL HIGH (ref 65–99)

## 2016-07-30 MED ORDER — AZITHROMYCIN 250 MG PO TABS
500.0000 mg | ORAL_TABLET | ORAL | 0 refills | Status: DC
Start: 2016-08-13 — End: 2016-11-08

## 2016-07-30 MED ORDER — ALBUTEROL SULFATE (2.5 MG/3ML) 0.083% IN NEBU
2.5000 mg | INHALATION_SOLUTION | Freq: Two times a day (BID) | RESPIRATORY_TRACT | Status: DC
  Administered 2016-07-30 – 2016-07-31 (×3): 2.5 mg via RESPIRATORY_TRACT
  Filled 2016-07-30 (×3): qty 3

## 2016-07-30 MED ORDER — TOBRAMYCIN SULFATE 80 MG/2ML IJ SOLN: 830.0000 mg | INTRAMUSCULAR | 0 refills | Status: AC

## 2016-07-30 MED ORDER — SODIUM CHLORIDE 0.9 % IV SOLN: 2.0000 g | Freq: Three times a day (TID) | INTRAVENOUS | 0 refills | Status: AC

## 2016-07-30 MED ORDER — TOBRAMYCIN 300 MG/5ML IN NEBU
300.0000 mg | INHALATION_SOLUTION | Freq: Every day | RESPIRATORY_TRACT | Status: DC
  Filled 2016-07-30: qty 5

## 2016-07-30 NOTE — Progress Notes (Signed)
STAFF NOTE: I, Dr Lavinia SharpsM Shaquil Aldana have personally reviewed patient's available data, including medical history, events of note, physical examination and test results as part of my evaluation. I have discussed with resident/NP and other care providers such as pharmacist, RN and RRT.  In addition,  I personally evaluated patient and elicited key findings of   S: seen earlier today on rounds. Feels near baseline and good enough to dc. Says CF care via [facility]. [place] Wife at bedside. Both report only mild CF "80% lung function"  At baseline  O: Overweight Eating sandwich FAirly clear lung exam Oriented x 3 Norrmal heart sounds   Recent Labs Lab 07/28/16 2003 07/30/16 0316  HGB 13.5 12.8*  HCT 41.5 39.4  WBC 7.7 6.5  PLT 383 296    Recent Labs Lab 07/28/16 2003 07/30/16 0316  NA 136 136  K 4.2 4.7  CL 105 102  CO2 24 27  GLUCOSE 146* 114*  BUN 13 14  CREATININE 0.87 1.02  CALCIUM 9.1 8.7*    Dg Chest 2 View  Result Date: 07/28/2016 CLINICAL DATA:  Initial evaluation for acute shortness of breath, cough. History of cystic fibrosis. EXAM: CHEST  2 VIEW COMPARISON:  Prior radiograph from 03/16/2016. FINDINGS: Right-sided Port-A-Cath in place, stable. Cardiac and mediastinal silhouettes are unchanged. Lung volumes are mildly reduced. No pulmonary edema or pleural effusion. Architectural distortion with bronchiectasis at the medial right lung base is similar to prior, likely chronic. No other focal infiltrates identified. No pneumothorax. No acute osseous abnormality. No acute osseous abnormality. IMPRESSION: 1. Similar appearance of architectural distortion with bronchiectasis at the medial right upper lobe, likely chronic. 2. No other active cardiopulmonary disease identified. Electronically Signed   By: Rise MuBenjamin  McClintock M.D.   On: 07/28/2016 20:46      A: Acute exacerbation of of CF bronchiectasis - improved  P: Abx as below Nebs  [facility]  Anti-infectives    Start     Dose/Rate Route Frequency Ordered Stop   08/13/16 0000  azithromycin (ZITHROMAX) 250 MG tablet     500 mg Oral Every M-W-F 07/30/16 1434     07/31/16 0800  tobramycin (PF) (TOBI) nebulizer solution 300 mg     300 mg Inhalation Daily 07/30/16 0457     07/31/16 0000  tobramycin 830 mg in dextrose 5 % 100 mL     830 mg 120.8 mL/hr over 60 Minutes Intravenous Every 24 hours 07/30/16 1434 08/10/16 2359   07/30/16 0900  azithromycin (ZITHROMAX) tablet 500 mg  Status:  Discontinued     500 mg Oral Once per day on Mon Wed Fri 07/29/16 0220 07/29/16 0411   07/30/16 0800  tobramycin (NEBCIN) 830 mg in dextrose 5 % 100 mL IVPB     830 mg 120.8 mL/hr over 60 Minutes Intravenous Every 24 hours 07/29/16 1923     07/30/16 0000  meropenem 2 g in sodium chloride 0.9 % 100 mL     2 g 200 mL/hr over 30 Minutes Intravenous Every 8 hours 07/30/16 1434 08/10/16 2359   07/29/16 0800  tobramycin (PF) (TOBI) nebulizer solution 300 mg  Status:  Discontinued     300 mg Inhalation 2 times daily 07/29/16 0220 07/30/16 0457   07/29/16 0500  tobramycin (NEBCIN) 930 mg in dextrose 5 % 100 mL IVPB     930 mg 123.3 mL/hr over 60 Minutes Intravenous  Once 07/29/16 0423 07/29/16 0824   07/29/16 0500  meropenem (MERREM) 1 g in sodium chloride 0.9 % 100 mL IVPB  Status:  Discontinued     1 g 200 mL/hr over 30 Minutes Intravenous Every 8 hours 07/29/16 0423 07/29/16 0434   07/29/16 0500  meropenem (MERREM) 2 g in sodium chloride 0.9 % 100 mL IVPB     2 g 200 mL/hr over 30 Minutes Intravenous Every 8 hours 07/29/16 0434        Dr. Kalman ShanMurali Hawley Pavia, M.D., F.C.C.P Pulmonary and Critical Care Medicine Staff Physician [facility] Pager: [phone], If no answer or between  15:00h - 7:00h: call [phone]  07/30/2016 6:00 PM

## 2016-07-30 NOTE — Care Management Note (Addendum)
Case Management Note  Patient Details  Name: James Proctor MRN: [id number] Date of Birth: 1986-02-29  Subjective/Objective:                 Spoke with patient and spouse at the bedside. Patient Dx with CF at age 30. Patient has PAC that is managed through [facility]. Anticipate DC to home with IV abx. Patient states he is always DC'd with long term IV Abx (ususally about 2 weeks) after hospital DC. Would like to continue with  [facility]. Has nebulizer, PT vest, and CPAP at home. Wife able to assist with infusions.  PCP Abner GreenspanBeth Hodges [place] [address] [place] [id number]. [facility] [place] [phone] Faxed H&P, Demographics, and clinicals through 12/18 Will need Rx, and labs at DC   Action/Plan:  Will have HH IV Abx and HH RN through [facility].  15:45 Came across DC order with [facility] of Merrem and Tobramycin. Faxed remainder of orders etc to [facility], Supp[facility] of [facility]x and [facility] as established prior to admission. Per Fleet [facility]achel at [facility], she will verify services can start tonight for Merrem. May not be able to accomodates with home infusion pharmacy to supply medications and HH RN to administer by PM dose today. Awaiting callback.   14:25 Notified that HH cannot accommodate DC tonight. Notified Jovita KussmaulKatalina Eubanks NP for DC to dc'd and labs for tobramycin to be placed. Anticipate DC in AM. Expected Discharge Date:                  Expected Discharge Plan:  Home w Home Health Services  In-House Referral:     Discharge planning Services  CM Consult  Post Acute Care Choice:    Choice offered to:     DME Arranged:    DME Agency:     HH Arranged:    HH Agency:   ([facility])  Status of Service:  In process, will continue to follow  If discussed at Long Length of Stay Meetings, dates discussed:    Additional Comments:  Lawerance SabalDebbie Grantham Hippert, RN 07/30/2016, 11:56 AM

## 2016-07-30 NOTE — Discharge Summary (Signed)
Physician Discharge Summary  Patient ID: James Proctor MRN: [id number] DOB/AGE: 28/16/1987 28 y.o.  Admit date: 07/28/2016 Discharge date: 07/31/2016    Discharge Diagnoses:  Cystic Fibrosis Associated Bronchiectasis with Acute Exacerbation  Left Leg Pain management s/p fall last week Seasonal Allergies  Hx DVT on Xarelto  Diabetes Mellitus  HTN GERD Depression / Restless Leg Syndrome                                                                         DISCHARGE PLAN BY DIAGNOSIS        Cystic Fibrosis Associated Bronchiectasis with current exacerbation  Discharge Plan: - Continue Meropenem and Tobramycin IV until 12/29 - Continue pre-admission inhaled Tobramycin QD - BMP and Tobramcyin levels in AM per Home Health Care  - continue nebulized saline, symbicort, pulmozyme, ivacaftor, pancrelipase - Hold home azithromycin until meropenem completed, restart 08/13/2016  - Follow up with primary pulmonology team at [facility] on Jan, 2 14:30  Left Leg Pain management s/p fall last week  Discharge Plan: - Continue home Lyrica  - Tylenol 3, 3 day dose ordered > follow up with PCP   Seasonal Allergies   Discharge Plan: - Continue singulair & claritin   Hx DVT on Xarelto   Discharge Plan: - continue Xarelto   Diabetes Mellitus   Discharge Plan: - continue levemir   HTN  Discharge Plan: - continue lisinopril   GERD  Discharge Plan: - continue omeprazole   Depression / Restless Leg Syndrome   Discharge Plan: - continue paxil, lyrica, mirapex                 DISCHARGE SUMMARY   James Proctor is a 30 y.o. y/o male with a PMH of Cystic Fibrosis (3905insT and S945L) on ivacaftor who presented to the ED on 12/16 with complaints of several days of worsening chest congestion, cough, and fatigue. His primary CF center is [facility], but explained that he typically presents to a local ED and then has [facility] coordinate his care with the local center for  discharge on home antibiotics for his typical exacerbations. In addition to CF, he states he is on life-long anticoagulation for recurrent DVTs.   The patient was admitted for cystic fibrosis associated bronchiectasis with exacerbation and treated with meropenem and tobramycin (which he is typically treated with for pseudomonas as well as Staph A).  Initial CXR demonstrated bronchiectasis in the medial RUL but no acute infiltrate.  Sputum culture obtained and final results pending (note abundant WBC's on gram stain).  The patient's home azithromycin was held while on meropenem.  Home medications were continued for hx of DVT, depression / RLS.  The patient improved and was medically cleared for discharge on 12/18 with plans as above.            SIGNIFICANT DIAGNOSTIC STUDIES Chest Xray 12/16 > Similar appearance of architectural distortion with bronchiectasis at the medial right upper lobe, no acute infiltrates  MICRO DATA  Sputum 12/17 > Abundant WBC, Predominantly PMN Few Gram Negative Coccobacilli Rare Gram Positive Cocci >>  ANTIBIOTICS Meropenem 12/16 > 12/29 Tobramycin 12/16 > 12/29   Discharge Exam: General: Adult, no distress, lying in bed  Neuro: Alert, oriented, follows commands    CV: no MRG, RRR, NI S1/S2 PULM: Diminished at bases, unlabored GI: non-tender, non-distended, active bowel sounds  Extremities: no deformities   Vitals:   07/30/16 2150 07/31/16 0536 07/31/16 0918 07/31/16 1113  BP: 105/60 (!) 106/59    Pulse: 88 66    Resp:  18    Temp: 97.8 F (36.6 C) 97.5 F (36.4 C)    TempSrc: Oral Oral    SpO2: 99% 98% 99% 98%  Weight:      Height:         Discharge Labs  BMET  Recent Labs Lab 07/28/16 2003 07/30/16 0316 07/31/16 0519  NA 136 136 136  K 4.2 4.7 4.3  CL 105 102 101  CO2 _0 GLUCOSE 146* 114* 132*  BUN _1 CREATININE 0.87 1.02 0.93  CALCIUM 9.1 8.7* 9.0    CBC  Recent Labs Lab 07/28/16 2003 07/30/16 0316  HGB 13.5  12.8*  HCT 41.5 39.4  WBC 7.7 6.5  PLT 383 296    Anti-Coagulation No results for input(s): INR in the last 168 hours.  Discharge Instructions    Call MD for:  difficulty breathing, headache or visual disturbances    Complete by:  As directed    Call MD for:  extreme fatigue    Complete by:  As directed    Call MD for:  hives    Complete by:  As directed    Call MD for:  persistant dizziness or light-headedness    Complete by:  As directed    Call MD for:  persistant nausea and vomiting    Complete by:  As directed    Call MD for:  redness, tenderness, or signs of infection (pain, swelling, redness, odor or green/yellow discharge around incision site)    Complete by:  As directed    At port a cath site   Call MD for:  severe uncontrolled pain    Complete by:  As directed    Call MD for:  temperature >100.4    Complete by:  As directed    Diet - low sodium heart healthy    Complete by:  As directed    Increase activity slowly    Complete by:  As directed        Follow-up Information    Dr. Cecelia Byars Follow up on 08/14/2016.   Why:  Appt at 2:30 PM.  Please call if you have needs or concerns before the appointment.   Contact information: [facility]  [phone]           Allergies as of 07/31/2016      Reactions   Cayston [aztreonam] Anaphylaxis   Cefepime Itching, Nausea Only, Other (See Comments)   Headaches also   Tobramycin Tinitus   Pt has received Tobramycin at [facility] since this allergy documented   Banana Itching, Nausea And Vomiting   Theophyllines Itching   Itchy throat      Medication List    TAKE these medications   acetaminophen-codeine 300-30 MG tablet Commonly known as:  TYLENOL #3 Take 1-2 tablets by mouth every 6 (six) hours as needed for moderate pain.   albuterol 1.25 MG/3ML nebulizer solution Commonly known as:  ACCUNEB Take 1.25 mg by nebulization 2 (two) times daily.   aspirin EC 325 MG tablet Take 325 mg by mouth daily as needed  (for headaches).   azithromycin 250 MG tablet Commonly known as:  ZITHROMAX Take 2 tablets (500 mg total) by  mouth every Monday, Wednesday, and Friday. Start taking on:  08/13/2016   cetirizine 10 MG tablet Commonly known as:  ZYRTEC Take 10 mg by mouth daily.   dornase alpha 1 MG/ML nebulizer solution Commonly known as:  PULMOZYME Inhale 2.5 mg into the lungs daily.   Ivacaftor 150 MG Tabs Take 150 mg by mouth every 12 (twelve) hours. KALYDECO   LEVEMIR FLEXTOUCH 100 UNIT/ML Pen Generic drug:  Insulin Detemir Inject 5 Units into the skin at bedtime.   lisinopril 5 MG tablet Commonly known as:  PRINIVIL,ZESTRIL Take 5 mg by mouth daily.   meropenem 2 g in sodium chloride 0.9 % 100 mL Inject 2 g into the vein every 8 (eight) hours.   montelukast 10 MG tablet Commonly known as:  SINGULAIR Take 10 mg by mouth daily.   omeprazole 20 MG capsule Commonly known as:  PRILOSEC Take 20 mg by mouth daily.   Pancrelipase (Lip-Prot-Amyl) 40000 units Cpep Take 3-6 capsules by mouth See admin instructions. 6 capsules three times a day with meals and 3 capsules with each snack   PARoxetine 10 MG tablet Commonly known as:  PAXIL Take 20 mg by mouth daily.   pramipexole 0.125 MG tablet Commonly known as:  MIRAPEX Take 0.25 mg by mouth at bedtime.   pregabalin 75 MG capsule Commonly known as:  LYRICA Take 75 mg by mouth 2 (two) times daily.   Sodium Chloride (Inhalant) 7 % Nebu Take 4 mLs by nebulization 2 (two) times daily.   SYMBICORT 160-4.5 MCG/ACT inhaler Generic drug:  budesonide-formoterol Inhale 2 puffs into the lungs 2 (two) times daily.   tobramycin (PF) 300 MG/5ML nebulizer solution Commonly known as:  TOBI Inhale 300 mg into the lungs every morning.   tobramycin 830 mg in dextrose 5 % 100 mL Inject 830 mg into the vein daily.   traZODone 100 MG tablet Commonly known as:  DESYREL Take 100 mg by mouth every evening.   Vitamin D3 2000 units capsule Take  4,000 Units by mouth daily.   XARELTO 20 MG Tabs tablet Generic drug:  rivaroxaban Take 20 mg by mouth daily.         Disposition: Home with home health RN for IV therapies. Follow up with [facility] as above.   Discharged Condition: Decarlo Lynn Tufano has met maximum benefit of inpatient care and is medically stable and cleared for discharge.  Patient is pending follow up as above.     Time spent on disposition:  Greater than 35 minutes.   Signed: Katalina Eubanks, AG-ACNP [place] [facility]  Pgr: [phone]  PCCM Pgr: [phone]     

## 2016-07-30 NOTE — Progress Notes (Signed)
Pharmacy Antibiotic Note James Proctor is a 30 y.o. male admitted on 07/28/2016 with CF exacerbation.  Pharmacy has been consulted for Meropenem and Tobramycin dosing. Noted pt also on Tobramycin nebs.  CCM MD requested extended interval dosing for Tobramycin  Noted that pt has received Meropenem 2gm IV q8h and Tobramycin 825mg  q24h during past [facility] admissions (last time receiving Tobramycin IV was March 2017). Allergy/intolerance of tinnitus noted with tobramycin but [facility] since this intolerance noted  Utilizing this protocol for dosing based on levels: https://www.nebraskamed.com/sites/default/files/documents/for-providers/asp/tnmc-cf-eiad.pdf Goal peak 20-30 mcg/ml and goal trough < 521mcg/ml for extended interval in CF patients  Pharmacokinetic Monitoring: Tobramycin 930 mg (~10 mg/kg adjusted body weight), given at 12/17 @ 0724 Level at 1155 = 16.8 and level at 1610 = 5.6 Ke = 0.2746, Half-life = 2.5 hrs Cmax = 38.29 mcg/mL (goal 20-30 mcg/mL)   Plan: Continue tobramycin at 830 mg iv Q 24 hours Will plan to recheck levels after 12/19 infusion     -Height: 6' (182.9 cm) Weight: 250 lb 1.6 oz (113.4 kg) IBW/kg (Calculated) : 77.6  Dosing weight = 91.9 kg  Temp (24hrs), Avg:97.9 F (36.6 C), Min:97.7 F (36.5 C), Max:98.2 F (36.8 C)   Recent Labs Lab 07/28/16 2003 07/29/16 1155 07/29/16 1610 07/30/16 0316  WBC 7.7  --   --  6.5  CREATININE 0.87  --   --  1.02  TOBRARND  --  16.8* 5.6  --     Estimated Creatinine Clearance: 140.2 mL/min (by C-G formula based on SCr of 1.02 mg/dL).    Allergies  Allergen Reactions  . Cayston [Aztreonam] Anaphylaxis  . Cefepime Itching, Nausea Only and Other (See Comments)    Headaches also  . Tobramycin Tinitus    [facility] since this allergy documented  . Banana Itching and Nausea And Vomiting  . Theophyllines Itching    Itchy throat    Antimicrobials this  admission: Meropenem 12/17 >>  Tobramycin IV 12/17 >>  Tobramycin nebs  Dose adjustments this admission: Tobramycin 930 mg (~10 mg/kg adjusted body weight), given at 12/17 @ 0724 Level at 1155 = 16.8 and level at 1610 = 5.6 - Decrease dose to 830 mg IV q24h  Microbiology results: 12/17 Sputum: Few gram negative coccobacilli, Rare gram positive cocci   Thank you for allowing pharmacy to be a part of this patient's care. Okey RegalLisa Theus Espin, PharmD [phone]  07/30/2016 2:04 PM

## 2016-07-31 LAB — BASIC METABOLIC PANEL
Anion gap: 8 (ref 5–15)
BUN: 14 mg/dL (ref 6–20)
CO2: 27 mmol/L (ref 22–32)
Calcium: 9 mg/dL (ref 8.9–10.3)
Chloride: 101 mmol/L (ref 101–111)
Creatinine, Ser: 0.93 mg/dL (ref 0.61–1.24)
GFR calc Af Amer: >60 mL/min (ref >60)
GLUCOSE: 132 mg/dL — AB (ref 65–99)
POTASSIUM: 4.3 mmol/L (ref 3.5–5.1)
Sodium: 136 mmol/L (ref 135–145)

## 2016-07-31 LAB — MRSA PCR SCREENING: MRSA by PCR: NEGATIVE

## 2016-07-31 LAB — GLUCOSE, CAPILLARY: Glucose-Capillary: 131 mg/dL — ABNORMAL HIGH (ref 65–99)

## 2016-07-31 LAB — TOBRAMYCIN LEVEL, RANDOM: Tobramycin Rm: 31.3 ug/mL

## 2016-07-31 MED ORDER — ACETAMINOPHEN-CODEINE #3 300-30 MG PO TABS: 1.0000 | ORAL_TABLET | Freq: Four times a day (QID) | ORAL | 0 refills | Status: DC | PRN

## 2016-07-31 NOTE — Progress Notes (Signed)
CRITICAL VALUE ALERT  Critical value received:  Tobramycin 31.6   Date of notification:  07/31/16  Time of notification:  13:12   Critical value read back:Yes.    Nurse who received alert:  Midge AverVicki Delanda Bulluck, RN  MD notified (1st page):  Jovita KussmaulKatalina Eubanks, AG-ACNP  Time of first page:  13:12  MD notified (2nd page):  Time of second page:  Responding MD:  Pierre BaliKatalina Eubans, AG-ACNP  Time MD responded:  13:13

## 2016-07-31 NOTE — Progress Notes (Signed)
  PULMONARY / CRITICAL CARE MEDICINE   Name: James Proctor MRN: [id number] DOB:  1985/09/27           ADMISSION DATE:  07/28/2016  CHIEF COMPLAINT:  CF exacerbation  Brief:   James Proctor is a 30 y/o M with cystic fibrosis (3905insT and S945L) on ivacaftor who presented to the ED with several days of worsening chest congestion, cough, and fatigue. His baseline FEV1 is ~80%.  His primary CF center is [facility], but explained that he typically presents to a local ED and then has [facility] coordinate his care with the local center for discharge on home antibiotics for his typical exacerbations. In addition to CF, he states he is on life-long anticoagulation for recurrent DVTs.    Subjective     Feels Better, Still having pain to left leg. Understands discharge instructions and follow-up appointments.    Objective    Vitals:   07/30/16 2150 07/31/16 0536  BP: 105/60 (!) 106/59  Pulse: 88 66  Resp:  18  Temp: 97.8 F (36.6 C) 97.5 F (36.4 C)   PHYSICAL EXAMINATION: General: Adult male, no distress  Neuro:  Awake, alert, follows commands, HEENT: Normocephalic  Cardiovascular:  RRR, no MRG, NI S1/S2  Lungs:  Unlabored, clear lung sounds   Abdomen:  non-tender, non-distended, active bowel sounds  Musculoskeletal:  No deformities Skin:  No rashes on visible skin  Assessment   Acute Exacerbation of CF bronchiectasis   Plan -Will prescribe Tylenol 3 for acute leg pain for 3 day dose > patient instructed to follow up with primary    care doctor this week.  -Spoke to Primary Pulmonology Team 12/18, informed of admission and plan. Follow-up appointment scheduled.  -No changes to D/C summary from 12/18. Will Discharge home with follow appointments as written in     D/C  Summary.   James Proctor, AG-ACNP [place] [facility]  Pgr: [phone]  PCCM Pgr: [phone]

## 2016-07-31 NOTE — Progress Notes (Addendum)
08:15 Spoke with Fleet Contrasachel at [facility], verified that nursing care will be at home btwn 2-3pm with administration of Merrem. Patient to have this AM dose of Tobramycin prior to discharge. Faxed this AM labs to [phone]. 10:25 LM with Fleet Contrasachel. Notified that patient will need Tobramycin random level drawn at 3pm per [facility] protocol. Per Misty StanleyLisa 5W pharmacist. 11:00 level will be drawn prior to discharge. 12:00 Spoke with Dr Maple HudsonYoung, listed attending, who referred to Dr Tyson AliasFeinstein, who referred to Dr Marchelle Gearingamaswamy, who referred to Idolina PrimerK Eubanks NP for DC order so patient can get [facility] 2[place]00[place]00 [place]. Ronnald RampK Eubank NP stated she would on unit in a few minutes to DC patient, Bedside RN Chip BoerVicki and patient updated.

## 2016-07-31 NOTE — Progress Notes (Signed)
Nsg Discharge Note  Admit Date:  07/28/2016 Discharge date: 07/31/2016   James Proctor to be D/C'd Home per MD order.  AVS completed.  Copy for chart, and copy for patient signed, and dated. Patient/caregiver able to verbalize understanding.  Discharge Medication: Allergies as of 07/31/2016      Reactions   Cayston [aztreonam] Anaphylaxis   Cefepime Itching, Nausea Only, Other (See Comments)   Headaches also   Tobramycin Tinitus   Pt [facility] since this allergy documented   Banana Itching, Nausea And Vomiting   Theophyllines Itching   Itchy throat      Medication List    TAKE these medications   acetaminophen-codeine 300-30 MG tablet Commonly known as:  TYLENOL #3 Take 1-2 tablets by mouth every 6 (six) hours as needed for moderate pain.   albuterol 1.25 MG/3ML nebulizer solution Commonly known as:  ACCUNEB Take 1.25 mg by nebulization 2 (two) times daily.   aspirin EC 325 MG tablet Take 325 mg by mouth daily as needed (for headaches).   azithromycin 250 MG tablet Commonly known as:  ZITHROMAX Take 2 tablets (500 mg total) by mouth every Monday, Wednesday, and Friday. Start taking on:  08/13/2016   cetirizine 10 MG tablet Commonly known as:  ZYRTEC Take 10 mg by mouth daily.   dornase alpha 1 MG/ML nebulizer solution Commonly known as:  PULMOZYME Inhale 2.5 mg into the lungs daily.   Ivacaftor 150 MG Tabs Take 150 mg by mouth every 12 (twelve) hours. KALYDECO   LEVEMIR FLEXTOUCH 100 UNIT/ML Pen Generic drug:  Insulin Detemir Inject 5 Units into the skin at bedtime.   lisinopril 5 MG tablet Commonly known as:  PRINIVIL,ZESTRIL Take 5 mg by mouth daily.   meropenem 2 g in sodium chloride 0.9 % 100 mL Inject 2 g into the vein every 8 (eight) hours.   montelukast 10 MG tablet Commonly known as:  SINGULAIR Take 10 mg by mouth daily.   omeprazole 20 MG capsule Commonly known as:  PRILOSEC Take 20 mg by mouth daily.    Pancrelipase (Lip-Prot-Amyl) 40000 units Cpep Take 3-6 capsules by mouth See admin instructions. 6 capsules three times a day with meals and 3 capsules with each snack   PARoxetine 10 MG tablet Commonly known as:  PAXIL Take 20 mg by mouth daily.   pramipexole 0.125 MG tablet Commonly known as:  MIRAPEX Take 0.25 mg by mouth at bedtime.   pregabalin 75 MG capsule Commonly known as:  LYRICA Take 75 mg by mouth 2 (two) times daily.   Sodium Chloride (Inhalant) 7 % Nebu Take 4 mLs by nebulization 2 (two) times daily.   SYMBICORT 160-4.5 MCG/ACT inhaler Generic drug:  budesonide-formoterol Inhale 2 puffs into the lungs 2 (two) times daily.   tobramycin (PF) 300 MG/5ML nebulizer solution Commonly known as:  TOBI Inhale 300 mg into the lungs every morning.   tobramycin 830 mg in dextrose 5 % 100 mL Inject 830 mg into the vein daily.   traZODone 100 MG tablet Commonly known as:  DESYREL Take 100 mg by mouth every evening.   Vitamin D3 2000 units capsule Take 4,000 Units by mouth daily.   XARELTO 20 MG Tabs tablet Generic drug:  rivaroxaban Take 20 mg by mouth daily.       Discharge Assessment: Vitals:   07/30/16 2150 07/31/16 0536  BP: 105/60 (!) 106/59  Pulse: 88 66  Resp:  18  Temp: 97.8 F (36.6 C) 97.5  F (36.4 C)   Skin clean, dry and intact without evidence of skin break down, no evidence of skin tears noted. IV catheter discontinued intact. Site without signs and symptoms of complications - no redness or edema noted at insertion site, patient denies c/o pain - only slight tenderness at site.  Dressing with slight pressure applied.  D/c Instructions-Education: Discharge instructions given to patient/family with verbalized understanding. D/c education completed with patient/family including follow up instructions, medication list, d/c activities limitations if indicated, with other d/c instructions as indicated by MD - patient able to verbalize understanding,  all questions fully answered. Patient instructed to return to ED, call 911, or call MD for any changes in condition.  Patient escorted via WC, and D/C home via private auto.  Camillo FlamingVicki L Jailyne Chieffo, RN 07/31/2016 1:15 PM

## 2016-07-31 NOTE — Progress Notes (Signed)
[facility] [phone], and Fleet [facility]ras[facility]achel at [facility] [phone]

## 2016-08-02 LAB — CULTURE, RESPIRATORY

## 2016-08-02 LAB — CULTURE, RESPIRATORY W GRAM STAIN

## 2016-09-12 ENCOUNTER — Encounter: Payer: Self-pay | Admitting: Internal Medicine

## 2016-11-05 ENCOUNTER — Emergency Department (HOSPITAL_COMMUNITY): Payer: BLUE CROSS/BLUE SHIELD

## 2016-11-05 ENCOUNTER — Inpatient Hospital Stay (HOSPITAL_COMMUNITY)
Admission: EM | Admit: 2016-11-05 | Discharge: 2016-11-08 | DRG: 178 | Disposition: A | Discharge status: Home / Self Care | Payer: BLUE CROSS/BLUE SHIELD | Attending: Internal Medicine | Admitting: Internal Medicine

## 2016-11-05 ENCOUNTER — Encounter (HOSPITAL_COMMUNITY): Payer: Self-pay | Admitting: Emergency Medicine

## 2016-11-05 DIAGNOSIS — J479 Bronchiectasis, uncomplicated: Secondary | ICD-10-CM

## 2016-11-05 DIAGNOSIS — A318 Other mycobacterial infections: Secondary | ICD-10-CM | POA: Diagnosis present

## 2016-11-05 DIAGNOSIS — Z79899 Other long term (current) drug therapy: Secondary | ICD-10-CM

## 2016-11-05 DIAGNOSIS — Z881 Allergy status to other antibiotic agents status: Secondary | ICD-10-CM

## 2016-11-05 DIAGNOSIS — R0902 Hypoxemia: Secondary | ICD-10-CM | POA: Diagnosis present

## 2016-11-05 DIAGNOSIS — E119 Type 2 diabetes mellitus without complications: Secondary | ICD-10-CM | POA: Diagnosis present

## 2016-11-05 DIAGNOSIS — Z7951 Long term (current) use of inhaled steroids: Secondary | ICD-10-CM

## 2016-11-05 DIAGNOSIS — R52 Pain, unspecified: Secondary | ICD-10-CM | POA: Diagnosis not present

## 2016-11-05 DIAGNOSIS — E876 Hypokalemia: Secondary | ICD-10-CM | POA: Diagnosis present

## 2016-11-05 DIAGNOSIS — Z888 Allergy status to other drugs, medicaments and biological substances status: Secondary | ICD-10-CM

## 2016-11-05 DIAGNOSIS — B965 Pseudomonas (aeruginosa) (mallei) (pseudomallei) as the cause of diseases classified elsewhere: Secondary | ICD-10-CM | POA: Diagnosis present

## 2016-11-05 DIAGNOSIS — A310 Pulmonary mycobacterial infection: Secondary | ICD-10-CM | POA: Diagnosis present

## 2016-11-05 DIAGNOSIS — A319 Mycobacterial infection, unspecified: Secondary | ICD-10-CM | POA: Diagnosis present

## 2016-11-05 DIAGNOSIS — Z91018 Allergy to other foods: Secondary | ICD-10-CM

## 2016-11-05 DIAGNOSIS — I119 Hypertensive heart disease without heart failure: Secondary | ICD-10-CM | POA: Diagnosis present

## 2016-11-05 DIAGNOSIS — J471 Bronchiectasis with (acute) exacerbation: Secondary | ICD-10-CM | POA: Diagnosis present

## 2016-11-05 DIAGNOSIS — R0781 Pleurodynia: Secondary | ICD-10-CM | POA: Diagnosis present

## 2016-11-05 DIAGNOSIS — Z792 Long term (current) use of antibiotics: Secondary | ICD-10-CM

## 2016-11-05 DIAGNOSIS — Z86718 Personal history of other venous thrombosis and embolism: Secondary | ICD-10-CM

## 2016-11-05 DIAGNOSIS — J9690 Respiratory failure, unspecified, unspecified whether with hypoxia or hypercapnia: Secondary | ICD-10-CM

## 2016-11-05 HISTORY — DX: Cystic fibrosis, unspecified: E84.9

## 2016-11-05 HISTORY — DX: Essential (primary) hypertension: I10

## 2016-11-05 LAB — I-STAT CHEM 8, ED
BUN: 15 mg/dL (ref 6–20)
CREATININE: 0.9 mg/dL (ref 0.61–1.24)
Calcium, Ion: 1.1 mmol/L — ABNORMAL LOW (ref 1.15–1.40)
Chloride: 103 mmol/L (ref 101–111)
Glucose, Bld: 96 mg/dL (ref 65–99)
HEMATOCRIT: 41 % (ref 39.0–52.0)
HEMOGLOBIN: 13.9 g/dL (ref 13.0–17.0)
Potassium: 3.2 mmol/L — ABNORMAL LOW (ref 3.5–5.1)
Sodium: 139 mmol/L (ref 135–145)
TCO2: 23 mmol/L (ref 0–100)

## 2016-11-05 LAB — CBC WITH DIFFERENTIAL/PLATELET
BASOS PCT: 1 %
Basophils Absolute: 0 10*3/uL (ref 0.0–0.1)
EOS ABS: 0.3 10*3/uL (ref 0.0–0.7)
EOS PCT: 4 %
HCT: 39.7 % (ref 39.0–52.0)
Hemoglobin: 14 g/dL (ref 13.0–17.0)
Lymphocytes Relative: 36 %
Lymphs Abs: 3 10*3/uL (ref 0.7–4.0)
MCH: 28.7 pg (ref 26.0–34.0)
MCHC: 35.3 g/dL (ref 30.0–36.0)
MCV: 81.4 fL (ref 78.0–100.0)
MONO ABS: 0.8 10*3/uL (ref 0.1–1.0)
MONOS PCT: 9 %
NEUTROS ABS: 4.3 10*3/uL (ref 1.7–7.7)
Neutrophils Relative %: 50 %
Platelets: 345 10*3/uL (ref 150–400)
RBC: 4.88 MIL/uL (ref 4.22–5.81)
RDW: 14.1 % (ref 11.5–15.5)
WBC: 8.4 10*3/uL (ref 4.0–10.5)

## 2016-11-05 LAB — BASIC METABOLIC PANEL
Anion gap: 14 (ref 5–15)
BUN: 12 mg/dL (ref 6–20)
CALCIUM: 9.7 mg/dL (ref 8.9–10.3)
CO2: 22 mmol/L (ref 22–32)
Chloride: 100 mmol/L — ABNORMAL LOW (ref 101–111)
Creatinine, Ser: 1 mg/dL (ref 0.61–1.24)
GFR calc Af Amer: >60 mL/min (ref >60)
GFR calc non Af Amer: >60 mL/min (ref >60)
GLUCOSE: 91 mg/dL (ref 65–99)
Potassium: 3.2 mmol/L — ABNORMAL LOW (ref 3.5–5.1)
Sodium: 136 mmol/L (ref 135–145)

## 2016-11-05 MED ORDER — MORPHINE SULFATE (PF) 4 MG/ML IV SOLN
4.0000 mg | Freq: Once | INTRAVENOUS | Status: AC
  Administered 2016-11-05: 4 mg via INTRAVENOUS
  Filled 2016-11-05: qty 1

## 2016-11-05 MED ORDER — MORPHINE SULFATE (PF) 4 MG/ML IV SOLN
2.0000 mg | Freq: Once | INTRAVENOUS | Status: AC
  Administered 2016-11-05: 2 mg via INTRAVENOUS
  Filled 2016-11-05: qty 1

## 2016-11-05 NOTE — ED Triage Notes (Signed)
Patient with history of CF, having two day history of increased WOB and shortness of breath.  [facility] [place], was given Albuterol treatment with no relief.  EMS was called and sent to ED.  EMS gave another albuterol treatment, 2.5mg  en route along with 125mg  Solumedrol.  Patient was placed on CPAP en route to ED per EMS paramedic.  Patient CAOx4 upon arrival to ED.  CBG of 124.

## 2016-11-05 NOTE — ED Provider Notes (Signed)
[facility]-EMERGENCY DEPT Provider Note   CSN: [id number] Arrival date & time: 11/05/16  2116     History   Chief Complaint Chief Complaint  Patient presents with  . Shortness of Breath    HPI James Proctor is a 31 y.o. male.  The history is provided by the patient and the EMS personnel.  Shortness of Breath  This is a recurrent problem. The average episode lasts 2 days. The problem occurs frequently.The current episode started 2 days ago. The problem has been gradually worsening. Associated symptoms include orthopnea and chest pain. Pertinent negatives include no fever, no headaches, no rhinorrhea, no sore throat, no neck pain, no cough, no sputum production, no wheezing, no syncope, no vomiting, no abdominal pain, no rash, no leg swelling and no claudication. He has tried inhaled steroids for the symptoms. The treatment provided mild relief. He has had prior hospitalizations. He has had prior ED visits. Associated medical issues comments: cystic fibrosis.    PMH cystic fibrosis presents via EMS from Urgent Care for two days increased WOB and DOE. s/p albuterol x2 and 125 solumedrol. Reports pleuritic chest pain. Placed on CPAP with EMS with minor symptomatic improvement. Denies fever, cough, N/V. Pt states symptoms are c/w previous flares.   Past Medical History:  Diagnosis Date  . Cystic fibrosis (HCC)   . Hypertension     Patient Active Problem List   Diagnosis Date Noted  . Pleuritic chest pain 11/05/2016  . Cystic fibrosis exacerbation (HCC) 07/29/2016  . Pain management 07/29/2016  . Hyperglycemia 07/29/2016    History reviewed. No pertinent surgical history.     Home Medications    Prior to Admission medications   Medication Sig Start Date End Date Taking? Authorizing Provider  albuterol (PROVENTIL HFA;VENTOLIN HFA) 108 (90 Base) MCG/ACT inhaler Inhale 1-2 puffs into the lungs every 6 (six) hours as needed for wheezing or shortness of breath.   Yes  Historical Provider, MD  albuterol (PROVENTIL) (2.5 MG/3ML) 0.083% nebulizer solution Take 2.5 mg by nebulization 2 (two) times daily.   Yes Historical Provider, MD  azithromycin (ZITHROMAX) 500 MG tablet Take 500 mg by mouth daily. Continuous   Yes Historical Provider, MD  B Complex-C-Folic Acid (RENAL SOFTGELS PO) Take 1 tablet by mouth 2 (two) times daily. With D5000   Yes Historical Provider, MD  budesonide (PULMICORT) 0.5 MG/2ML nebulizer solution Take 0.5 mg by nebulization 2 (two) times daily.   Yes Historical Provider, MD  budesonide-formoterol (SYMBICORT) 160-4.5 MCG/ACT inhaler Inhale 2 puffs into the lungs 2 (two) times daily. 10/06/15  Yes Historical Provider, MD  cetirizine (ZYRTEC) 10 MG tablet Take 10 mg by mouth daily.   Yes Historical Provider, MD  CLOFAZIMINE PO Take 100 mg by mouth daily with lunch.    Yes Historical Provider, MD  dornase alpha (PULMOZYME) 1 MG/ML nebulizer solution Inhale 2.5 mg into the lungs daily. 11/29/15  Yes Historical Provider, MD  lisinopril (PRINIVIL,ZESTRIL) 5 MG tablet Take 5 mg by mouth daily. 11/29/15 11/28/16 Yes Historical Provider, MD  metoprolol succinate (TOPROL-XL) 25 MG 24 hr tablet Take 25 mg by mouth 2 (two) times daily.   Yes Historical Provider, MD  montelukast (SINGULAIR) 10 MG tablet Take 10 mg by mouth daily. 04/10/16 04/05/17 Yes Historical Provider, MD  omeprazole (PRILOSEC) 20 MG capsule Take 20 mg by mouth daily. 06/19/16  Yes Historical Provider, MD  Pancrelipase, Lip-Prot-Amyl, 40000 units CPEP Take 3-6 capsules by mouth See admin instructions. 6 capsules three times a day with  meals and 3 capsules with each snack 04/02/16  Yes Historical Provider, MD  PARoxetine (PAXIL) 10 MG tablet Take 20 mg by mouth daily.  04/10/16 04/05/17 Yes Historical Provider, MD  pramipexole (MIRAPEX) 0.125 MG tablet Take 0.25 mg by mouth at bedtime.  08/22/15  Yes Historical Provider, MD  pregabalin (LYRICA) 100 MG capsule Take 100 mg by mouth 2 (two) times daily.    Yes Historical Provider, MD  Probiotic Product (PROBIOTIC PO) Take 1 tablet by mouth daily.   Yes Historical Provider, MD  Sodium Chloride, Inhalant, 7 % NEBU Take 4 mLs by nebulization 2 (two) times daily.  06/21/16  Yes Historical Provider, MD  Sodium Chloride-Sodium Bicarb 2300-700 MG PACK Place 1 packet into the nose 2 (two) times daily.   Yes Historical Provider, MD  Tezacaftor-Ivacaftor&Ivacaftor (SYMDEKO) 100-150 & 150 MG TBPK Take 1 tablet by mouth 2 (two) times daily.   Yes Historical Provider, MD  tobramycin, PF, (TOBI) 300 MG/5ML nebulizer solution Inhale 300 mg into the lungs 2 (two) times daily.  05/18/16  Yes Historical Provider, MD  traMADol (ULTRAM) 50 MG tablet Take 50 mg by mouth 3 (three) times daily as needed for moderate pain.   Yes Historical Provider, MD  traZODone (DESYREL) 100 MG tablet Take 100 mg by mouth every evening. 08/04/15  Yes Historical Provider, MD  XARELTO 20 MG TABS tablet Take 20 mg by mouth daily. 07/06/16  Yes Historical Provider, MD  acetaminophen-codeine (TYLENOL #3) 300-30 MG tablet Take 1-2 tablets by mouth every 6 (six) hours as needed for moderate pain. Patient not taking: Reported on 11/05/2016 07/31/16   Tobey Grim, NP  azithromycin [facility]) 250 MG tablet Take 2 tablets (500 mg total) by mouth every Monday, Wednesday, and Friday. Patient not taking: Reported on 11/05/2016 08/13/16   Jeanella Craze, NP    Family History History reviewed. No pertinent family history.  Social History Social History  Substance Use Topics  . Smoking status: Never Smoker  . Smokeless tobacco: Never Used  . Alcohol use No     Allergies   Cayston [aztreonam]; Cefepime; Tobramycin; Banana; and Theophyllines   Review of Systems Review of Systems  Constitutional: Negative for appetite change, chills and fever.  HENT: Negative for rhinorrhea and sore throat.   Respiratory: Positive for shortness of breath. Negative for cough, sputum production, chest  tightness and wheezing.   Cardiovascular: Positive for chest pain and orthopnea. Negative for claudication, leg swelling and syncope.  Gastrointestinal: Negative for abdominal pain, diarrhea, nausea and vomiting.  Musculoskeletal: Negative for back pain and neck pain.  Skin: Negative for rash.  Neurological: Negative for light-headedness and headaches.  All other systems reviewed and are negative.    Physical Exam Updated Vital Signs BP 119/69   Pulse 80   Temp 97.6 F (36.4 C) (Oral)   Resp (!) 24   Ht 6' (1.829 m)   Wt 117.5 kg   SpO2 98%   BMI 35.13 kg/m   Physical Exam  Constitutional: He is oriented to person, place, and time. He appears well-developed and well-nourished.  HENT:  Head: Normocephalic and atraumatic.  Eyes: Conjunctivae and EOM are normal.  Neck: Normal range of motion. Neck supple.  Cardiovascular: Normal rate and regular rhythm.   No murmur heard. Pulmonary/Chest: Breath sounds normal. Tachypnea noted. He is in respiratory distress.  Speaks in short sentences  Abdominal: Soft. There is no tenderness.  Musculoskeletal: Normal range of motion. He exhibits no edema.  Neurological: He is  alert and oriented to person, place, and time.  Skin: Skin is warm and dry. Capillary refill takes less than 2 seconds.  Psychiatric: He has a normal mood and affect.  Nursing note and vitals reviewed.    ED Treatments / Results  Labs (all labs ordered are listed, but only abnormal results are displayed) Labs Reviewed  BASIC METABOLIC PANEL - Abnormal; Notable for the following:       Result Value   Potassium 3.2 (*)    Chloride 100 (*)    All other components within normal limits  I-STAT CHEM 8, ED - Abnormal; Notable for the following:    Potassium 3.2 (*)    Calcium, Ion 1.10 (*)    All other components within normal limits  CBC WITH DIFFERENTIAL/PLATELET    EKG  EKG Interpretation None       Radiology Dg Chest Port 1 View  Result Date:  11/05/2016 CLINICAL DATA:  Cystic fibrosis flare up EXAM: PORTABLE CHEST 1 VIEW COMPARISON:  Cough 16 17 FINDINGS: The heart size and mediastinal contours are within normal limits. Right upper lobe bronchiectasis is stable in appearance. No acute pulmonic consolidation, effusion or pulmonary edema. Port catheter tip is seen from right-sided approach terminating in the right atrium. The visualized skeletal structures are unremarkable. IMPRESSION: Chronic bronchiectasis in the right upper lobe. No acute pulmonary consolidation, edema, pneumothorax nor effusion. Electronically Signed   By: Tollie Ethavid  Kwon M.D.   On: 11/05/2016 22:27    Procedures Procedures (including critical care time)  Medications Ordered in ED Medications  morphine 4 MG/ML injection 4 mg (4 mg Intravenous Given 11/05/16 2159)  morphine 4 MG/ML injection 2 mg (2 mg Intravenous Given 11/05/16 2211)     Initial Impression / Assessment and Plan / ED Course  I have reviewed the triage vital signs and the nursing notes.  Pertinent labs & imaging results that were available during my care of the patient were reviewed by me and considered in my medical decision making (see chart for details).    31 y.o. male PMH CF presents for chest pain, increased WOB and dyspnea on exertion. Transient hypoxia with coughing. Appears uncomfortable. Lungs CTAB and moving air well. Given IV pain medication. CXR w/o focal consolidation. Labs unremarkable. EKG NSR w/o ischemic changes or right heart strain. Doubt PE or ACS. Given transient hypoxia, increased WOB and potential to decompensate will admit for further care.   Final Clinical Impressions(s) / ED Diagnoses   Final diagnoses:  Pain    New Prescriptions New Prescriptions   No medications on file     Pablo LedgerElizabeth Mitchell Della Scrivener, MD 11/06/16 0109    Gerhard Munchobert Lockwood, MD 11/06/16 [phone]

## 2016-11-05 NOTE — ED Notes (Signed)
RN noticed O2 level decrease for short periods of time, found pt coughing, O2 would increase back as soon as he stopped.  Pt stated that he would feel better on some O2, RN placed pt on 2L of O2 [place].

## 2016-11-06 ENCOUNTER — Encounter (HOSPITAL_COMMUNITY): Payer: Self-pay

## 2016-11-06 DIAGNOSIS — Z86718 Personal history of other venous thrombosis and embolism: Secondary | ICD-10-CM | POA: Diagnosis not present

## 2016-11-06 DIAGNOSIS — B965 Pseudomonas (aeruginosa) (mallei) (pseudomallei) as the cause of diseases classified elsewhere: Secondary | ICD-10-CM | POA: Diagnosis present

## 2016-11-06 DIAGNOSIS — R52 Pain, unspecified: Secondary | ICD-10-CM | POA: Diagnosis present

## 2016-11-06 DIAGNOSIS — Z881 Allergy status to other antibiotic agents status: Secondary | ICD-10-CM | POA: Diagnosis not present

## 2016-11-06 DIAGNOSIS — Z888 Allergy status to other drugs, medicaments and biological substances status: Secondary | ICD-10-CM | POA: Diagnosis not present

## 2016-11-06 DIAGNOSIS — E119 Type 2 diabetes mellitus without complications: Secondary | ICD-10-CM | POA: Diagnosis present

## 2016-11-06 DIAGNOSIS — E876 Hypokalemia: Secondary | ICD-10-CM | POA: Diagnosis present

## 2016-11-06 DIAGNOSIS — A318 Other mycobacterial infections: Secondary | ICD-10-CM | POA: Diagnosis present

## 2016-11-06 DIAGNOSIS — Z91018 Allergy to other foods: Secondary | ICD-10-CM | POA: Diagnosis not present

## 2016-11-06 DIAGNOSIS — J471 Bronchiectasis with (acute) exacerbation: Secondary | ICD-10-CM | POA: Diagnosis present

## 2016-11-06 DIAGNOSIS — Z7951 Long term (current) use of inhaled steroids: Secondary | ICD-10-CM | POA: Diagnosis not present

## 2016-11-06 DIAGNOSIS — A310 Pulmonary mycobacterial infection: Secondary | ICD-10-CM | POA: Diagnosis present

## 2016-11-06 DIAGNOSIS — J470 Bronchiectasis with acute lower respiratory infection: Secondary | ICD-10-CM

## 2016-11-06 DIAGNOSIS — Z310 Encounter for reversal of previous sterilization: Secondary | ICD-10-CM | POA: Diagnosis not present

## 2016-11-06 DIAGNOSIS — I119 Hypertensive heart disease without heart failure: Secondary | ICD-10-CM | POA: Diagnosis present

## 2016-11-06 DIAGNOSIS — Z792 Long term (current) use of antibiotics: Secondary | ICD-10-CM | POA: Diagnosis not present

## 2016-11-06 DIAGNOSIS — J479 Bronchiectasis, uncomplicated: Secondary | ICD-10-CM

## 2016-11-06 DIAGNOSIS — Z79899 Other long term (current) drug therapy: Secondary | ICD-10-CM | POA: Diagnosis not present

## 2016-11-06 DIAGNOSIS — J9601 Acute respiratory failure with hypoxia: Secondary | ICD-10-CM

## 2016-11-06 DIAGNOSIS — R0902 Hypoxemia: Secondary | ICD-10-CM | POA: Diagnosis present

## 2016-11-06 DIAGNOSIS — A319 Mycobacterial infection, unspecified: Secondary | ICD-10-CM | POA: Diagnosis present

## 2016-11-06 LAB — HIV ANTIBODY (ROUTINE TESTING W REFLEX): HIV SCREEN 4TH GENERATION: NONREACTIVE

## 2016-11-06 LAB — BASIC METABOLIC PANEL
Anion gap: 15 (ref 5–15)
BUN: 20 mg/dL (ref 6–20)
CALCIUM: 9.3 mg/dL (ref 8.9–10.3)
CHLORIDE: 98 mmol/L — AB (ref 101–111)
CO2: 21 mmol/L — ABNORMAL LOW (ref 22–32)
CREATININE: 1.16 mg/dL (ref 0.61–1.24)
GFR calc Af Amer: >60 mL/min (ref >60)
Glucose, Bld: 203 mg/dL — ABNORMAL HIGH (ref 65–99)
Potassium: 4 mmol/L (ref 3.5–5.1)
SODIUM: 134 mmol/L — AB (ref 135–145)

## 2016-11-06 LAB — CBC
HCT: 39.7 % (ref 39.0–52.0)
Hemoglobin: 13.5 g/dL (ref 13.0–17.0)
MCH: 28.2 pg (ref 26.0–34.0)
MCHC: 34 g/dL (ref 30.0–36.0)
MCV: 82.9 fL (ref 78.0–100.0)
Platelets: 302 10*3/uL (ref 150–400)
RBC: 4.79 MIL/uL (ref 4.22–5.81)
RDW: 14.8 % (ref 11.5–15.5)
WBC: 18.8 10*3/uL — ABNORMAL HIGH (ref 4.0–10.5)

## 2016-11-06 LAB — TROPONIN I: Troponin I: <0.03 ng/mL (ref <0.03)

## 2016-11-06 MED ORDER — PREGABALIN 50 MG PO CAPS
100.0000 mg | ORAL_CAPSULE | Freq: Two times a day (BID) | ORAL | Status: DC
  Administered 2016-11-06 – 2016-11-08 (×6): 100 mg via ORAL
  Filled 2016-11-06 (×6): qty 2

## 2016-11-06 MED ORDER — POTASSIUM CHLORIDE CRYS ER 20 MEQ PO TBCR
40.0000 meq | EXTENDED_RELEASE_TABLET | Freq: Once | ORAL | Status: AC
  Administered 2016-11-06: 40 meq via ORAL
  Filled 2016-11-06: qty 2

## 2016-11-06 MED ORDER — MONTELUKAST SODIUM 10 MG PO TABS
10.0000 mg | ORAL_TABLET | Freq: Every day | ORAL | Status: DC
  Administered 2016-11-06 – 2016-11-08 (×3): 10 mg via ORAL
  Filled 2016-11-06 (×3): qty 1

## 2016-11-06 MED ORDER — SODIUM CHLORIDE 7 % IN NEBU
4.0000 mL | INHALATION_SOLUTION | Freq: Two times a day (BID) | RESPIRATORY_TRACT | Status: DC
  Administered 2016-11-06 – 2016-11-07 (×3): 4 mL via RESPIRATORY_TRACT
  Administered 2016-11-08: 4 mL/mL via RESPIRATORY_TRACT
  Filled 2016-11-06 (×7): qty 1

## 2016-11-06 MED ORDER — PRAMIPEXOLE DIHYDROCHLORIDE 0.25 MG PO TABS
0.2500 mg | ORAL_TABLET | Freq: Every day | ORAL | Status: DC
  Administered 2016-11-06 – 2016-11-07 (×2): 0.25 mg via ORAL
  Filled 2016-11-06 (×2): qty 1

## 2016-11-06 MED ORDER — ALBUTEROL SULFATE (2.5 MG/3ML) 0.083% IN NEBU
2.5000 mg | INHALATION_SOLUTION | Freq: Four times a day (QID) | RESPIRATORY_TRACT | Status: DC
  Administered 2016-11-06 (×3): 2.5 mg via RESPIRATORY_TRACT
  Filled 2016-11-06 (×3): qty 3

## 2016-11-06 MED ORDER — AZITHROMYCIN 250 MG PO TABS
500.0000 mg | ORAL_TABLET | Freq: Every day | ORAL | Status: DC
  Administered 2016-11-06 – 2016-11-08 (×3): 500 mg via ORAL
  Filled 2016-11-06 (×3): qty 2

## 2016-11-06 MED ORDER — METOPROLOL SUCCINATE ER 25 MG PO TB24
25.0000 mg | ORAL_TABLET | Freq: Two times a day (BID) | ORAL | Status: DC
  Administered 2016-11-06 – 2016-11-08 (×6): 25 mg via ORAL
  Filled 2016-11-06 (×6): qty 1

## 2016-11-06 MED ORDER — SODIUM CHLORIDE-SODIUM BICARB 2300-700 MG NA PACK: 1.0000 | PACK | Freq: Two times a day (BID) | NASAL | Status: DC

## 2016-11-06 MED ORDER — TOBRAMYCIN SULFATE 80 MG/2ML IJ SOLN
200.0000 mg | Freq: Three times a day (TID) | INTRAMUSCULAR | Status: DC
  Administered 2016-11-06 – 2016-11-08 (×6): 200 mg via INTRAVENOUS
  Filled 2016-11-06 (×8): qty 5

## 2016-11-06 MED ORDER — SODIUM CHLORIDE 0.9 % IV SOLN
INTRAVENOUS | Status: DC
  Administered 2016-11-06 (×2): via INTRAVENOUS

## 2016-11-06 MED ORDER — ALBUTEROL SULFATE (2.5 MG/3ML) 0.083% IN NEBU: 2.5000 mg | INHALATION_SOLUTION | Freq: Two times a day (BID) | RESPIRATORY_TRACT | Status: DC

## 2016-11-06 MED ORDER — LISINOPRIL 5 MG PO TABS
5.0000 mg | ORAL_TABLET | Freq: Every day | ORAL | Status: DC
  Administered 2016-11-06 – 2016-11-08 (×3): 5 mg via ORAL
  Filled 2016-11-06 (×3): qty 1

## 2016-11-06 MED ORDER — TOBRAMYCIN SULFATE 80 MG/2ML IJ SOLN
230.0000 mg | Freq: Once | INTRAMUSCULAR | Status: AC
  Administered 2016-11-06: 230 mg via INTRAVENOUS
  Filled 2016-11-06: qty 5.75

## 2016-11-06 MED ORDER — PANTOPRAZOLE SODIUM 40 MG PO TBEC
40.0000 mg | DELAYED_RELEASE_TABLET | Freq: Every day | ORAL | Status: DC
  Administered 2016-11-06 – 2016-11-08 (×3): 40 mg via ORAL
  Filled 2016-11-06 (×3): qty 1

## 2016-11-06 MED ORDER — ALBUTEROL SULFATE (2.5 MG/3ML) 0.083% IN NEBU
3.0000 mL | INHALATION_SOLUTION | Freq: Four times a day (QID) | RESPIRATORY_TRACT | Status: DC | PRN
  Administered 2016-11-07: 3 mL via RESPIRATORY_TRACT

## 2016-11-06 MED ORDER — SODIUM CHLORIDE 0.9 % IV SOLN
2.0000 g | Freq: Three times a day (TID) | INTRAVENOUS | Status: DC
  Administered 2016-11-06 – 2016-11-08 (×8): 2 g via INTRAVENOUS
  Filled 2016-11-06 (×10): qty 2

## 2016-11-06 MED ORDER — PANCRELIPASE (LIP-PROT-AMYL) 36000-114000 UNITS PO CPEP
216000.0000 [IU] | ORAL_CAPSULE | Freq: Three times a day (TID) | ORAL | Status: DC
  Administered 2016-11-06 – 2016-11-08 (×8): 216000 [IU] via ORAL
  Filled 2016-11-06 (×9): qty 6

## 2016-11-06 MED ORDER — ACETAMINOPHEN 325 MG PO TABS: 650.0000 mg | ORAL_TABLET | Freq: Four times a day (QID) | ORAL | Status: DC | PRN

## 2016-11-06 MED ORDER — PANCRELIPASE (LIP-PROT-AMYL) 36000-114000 UNITS PO CPEP: 108000.0000 [IU] | ORAL_CAPSULE | ORAL | Status: DC | PRN

## 2016-11-06 MED ORDER — RIVAROXABAN 20 MG PO TABS
20.0000 mg | ORAL_TABLET | Freq: Every day | ORAL | Status: DC
  Administered 2016-11-06 – 2016-11-07 (×3): 20 mg via ORAL
  Filled 2016-11-06 (×3): qty 1

## 2016-11-06 MED ORDER — SODIUM CHLORIDE 1 G PO TABS
2.0000 g | ORAL_TABLET | Freq: Two times a day (BID) | ORAL | Status: DC
  Administered 2016-11-06 – 2016-11-08 (×5): 2 g via ORAL
  Filled 2016-11-06 (×6): qty 2

## 2016-11-06 MED ORDER — LORATADINE 10 MG PO TABS
10.0000 mg | ORAL_TABLET | Freq: Every day | ORAL | Status: DC
  Administered 2016-11-06 – 2016-11-08 (×3): 10 mg via ORAL
  Filled 2016-11-06 (×3): qty 1

## 2016-11-06 MED ORDER — BUDESONIDE 0.5 MG/2ML IN SUSP
0.5000 mg | Freq: Two times a day (BID) | RESPIRATORY_TRACT | Status: DC
  Administered 2016-11-06 – 2016-11-08 (×5): 0.5 mg via RESPIRATORY_TRACT
  Filled 2016-11-06 (×5): qty 2

## 2016-11-06 MED ORDER — ALBUTEROL SULFATE (2.5 MG/3ML) 0.083% IN NEBU
2.5000 mg | INHALATION_SOLUTION | Freq: Two times a day (BID) | RESPIRATORY_TRACT | Status: DC
  Administered 2016-11-07 – 2016-11-08 (×3): 2.5 mg via RESPIRATORY_TRACT
  Filled 2016-11-06 (×3): qty 3

## 2016-11-06 MED ORDER — STUDY - INVESTIGATIONAL MEDICATION
100.0000 mg | Freq: Every day | Status: DC
  Administered 2016-11-06 – 2016-11-08 (×3): 100 mg via ORAL
  Filled 2016-11-06 (×3): qty 1

## 2016-11-06 MED ORDER — TRAZODONE HCL 100 MG PO TABS
100.0000 mg | ORAL_TABLET | Freq: Every evening | ORAL | Status: DC
  Administered 2016-11-06 – 2016-11-07 (×2): 100 mg via ORAL
  Filled 2016-11-06 (×3): qty 1

## 2016-11-06 MED ORDER — MORPHINE SULFATE (PF) 2 MG/ML IV SOLN
2.0000 mg | INTRAVENOUS | Status: DC | PRN
  Administered 2016-11-06 – 2016-11-07 (×6): 2 mg via INTRAVENOUS
  Filled 2016-11-06 (×7): qty 1

## 2016-11-06 MED ORDER — TEZACAFTOR-IVACAFTOR&IVACAFTOR 100-150 & 150 MG PO TBPK
1.0000 | ORAL_TABLET | Freq: Two times a day (BID) | ORAL | Status: DC
  Administered 2016-11-06 – 2016-11-08 (×5): 1 via ORAL
  Filled 2016-11-06 (×4): qty 1

## 2016-11-06 MED ORDER — KETOROLAC TROMETHAMINE 30 MG/ML IJ SOLN
30.0000 mg | Freq: Three times a day (TID) | INTRAMUSCULAR | Status: DC | PRN
Start: 2016-11-06 — End: 2016-11-07
  Administered 2016-11-06 (×3): 30 mg via INTRAVENOUS
  Filled 2016-11-06 (×3): qty 1

## 2016-11-06 MED ORDER — DORNASE ALFA 2.5 MG/2.5ML IN SOLN
2.5000 mg | Freq: Every day | RESPIRATORY_TRACT | Status: DC
  Administered 2016-11-06 – 2016-11-08 (×3): 2.5 mg via RESPIRATORY_TRACT
  Filled 2016-11-06 (×6): qty 2.5

## 2016-11-06 MED ORDER — PAROXETINE HCL 20 MG PO TABS
20.0000 mg | ORAL_TABLET | Freq: Every day | ORAL | Status: DC
  Administered 2016-11-06 – 2016-11-08 (×3): 20 mg via ORAL
  Filled 2016-11-06 (×3): qty 1

## 2016-11-06 MED ORDER — SODIUM BICARBONATE 650 MG PO TABS
650.0000 mg | ORAL_TABLET | Freq: Two times a day (BID) | ORAL | Status: DC
  Administered 2016-11-06 – 2016-11-08 (×5): 650 mg via ORAL
  Filled 2016-11-06 (×5): qty 1

## 2016-11-06 MED ORDER — ACETAMINOPHEN 650 MG RE SUPP: 650.0000 mg | Freq: Four times a day (QID) | RECTAL | Status: DC | PRN

## 2016-11-06 MED ORDER — MOMETASONE FURO-FORMOTEROL FUM 200-5 MCG/ACT IN AERO
2.0000 | INHALATION_SPRAY | Freq: Two times a day (BID) | RESPIRATORY_TRACT | Status: DC
  Filled 2016-11-06: qty 8.8

## 2016-11-06 NOTE — Consult Note (Addendum)
Name: James Proctor MRN: [id number] DOB: [birth year]    ADMISSION DATE:  11/05/2016 CONSULTATION DATE:  3/27  REFERRING MD :  Dr. Hal Hope   CHIEF COMPLAINT:  Dyspnea  HISTORY OF PRESENT ILLNESS:  31 year old male with PMH as below, which is significant for cystic fibrosis, HTN, DM, DVT on Xarelto and MAI currently treated with azithromycin PO and inhaled amikacin. He is followed at [facility] by Dr. Cecelia Byars for his CF. 3/24 he began to experience profound dyspnea on exertion complicated by pleuritic chest pain. Associated symptoms include productive cough and subjective fevers while sleeping/night sweats. 3/26 he was checking his home pulse oximeter which demonstrated transient tachycardia to 130s and hypoxia to 80s on room air. He contacted his primary pulmonologist who referred him to ED. He presented to urgent care with these complaints and was felt to be developing a pneumonia so he was transferred to ED via EMS. He was given albuterol nebulizer and 162m solumedrol. Patient states symptoms are consistent with past flares of CF. He was admitted to the hospitalist team and PCCM was asked to see for further evaluation.  SIGNIFICANT EVENTS    STUDIES:  PCXR 3/26 > Chronic changes, no acute infiltrate.  PAST MEDICAL HISTORY :   has a past medical history of Cystic fibrosis ([place] and Hypertension.  has no past surgical history on file. Prior to Admission medications   Medication Sig Start Date End Date Taking? Authorizing Provider  albuterol (PROVENTIL HFA;VENTOLIN HFA) 108 (90 Base) MCG/ACT inhaler Inhale 1-2 puffs into the lungs every 6 (six) hours as needed for wheezing or shortness of breath.   Yes Historical Provider, MD  albuterol (PROVENTIL) (2.5 MG/3ML) 0.083% nebulizer solution Take 2.5 mg by nebulization 2 (two) times daily.   Yes Historical Provider, MD  azithromycin (ZITHROMAX) 500 MG tablet Take 500 mg by mouth daily. Continuous   Yes Historical Provider, MD  B  Complex-C-Folic Acid (RENAL SOFTGELS PO) Take 1 tablet by mouth 2 (two) times daily. With D5000   Yes Historical Provider, MD  budesonide (PULMICORT) 0.5 MG/2ML nebulizer solution Take 0.5 mg by nebulization 2 (two) times daily.   Yes Historical Provider, MD  budesonide-formoterol (SYMBICORT) 160-4.5 MCG/ACT inhaler Inhale 2 puffs into the lungs 2 (two) times daily. 10/06/15  Yes Historical Provider, MD  cetirizine (ZYRTEC) 10 MG tablet Take 10 mg by mouth daily.   Yes Historical Provider, MD  CLOFAZIMINE PO Take 100 mg by mouth daily with lunch.    Yes Historical Provider, MD  dornase alpha (PULMOZYME) 1 MG/ML nebulizer solution Inhale 2.5 mg into the lungs daily. 11/29/15  Yes Historical Provider, MD  lisinopril (PRINIVIL,ZESTRIL) 5 MG tablet Take 5 mg by mouth daily. 11/29/15 11/28/16 Yes Historical Provider, MD  metoprolol succinate (TOPROL-XL) 25 MG 24 hr tablet Take 25 mg by mouth 2 (two) times daily.   Yes Historical Provider, MD  montelukast (SINGULAIR) 10 MG tablet Take 10 mg by mouth daily. 04/10/16 04/05/17 Yes Historical Provider, MD  omeprazole (PRILOSEC) 20 MG capsule Take 20 mg by mouth daily. 06/19/16  Yes Historical Provider, MD  Pancrelipase, Lip-Prot-Amyl, 40000 units CPEP Take 3-6 capsules by mouth See admin instructions. 6 capsules three times a day with meals and 3 capsules with each snack 04/02/16  Yes Historical Provider, MD  PARoxetine (PAXIL) 10 MG tablet Take 20 mg by mouth daily.  04/10/16 04/05/17 Yes Historical Provider, MD  pramipexole (MIRAPEX) 0.125 MG tablet Take 0.25 mg by mouth at bedtime.  08/22/15  Yes  Historical Provider, MD  pregabalin (LYRICA) 100 MG capsule Take 100 mg by mouth 2 (two) times daily.   Yes Historical Provider, MD  Probiotic Product (PROBIOTIC PO) Take 1 tablet by mouth daily.   Yes Historical Provider, MD  Sodium Chloride, Inhalant, 7 % NEBU Take 4 mLs by nebulization 2 (two) times daily.  06/21/16  Yes Historical Provider, MD  Sodium Chloride-Sodium Bicarb  2300-700 MG PACK Place 1 packet into the nose 2 (two) times daily.   Yes Historical Provider, MD  Tezacaftor-Ivacaftor&Ivacaftor (SYMDEKO) 100-150 & 150 MG TBPK Take 1 tablet by mouth 2 (two) times daily.   Yes Historical Provider, MD  tobramycin, PF, (TOBI) 300 MG/5ML nebulizer solution Inhale 300 mg into the lungs 2 (two) times daily.  05/18/16  Yes Historical Provider, MD  traMADol (ULTRAM) 50 MG tablet Take 50 mg by mouth 3 (three) times daily as needed for moderate pain.   Yes Historical Provider, MD  traZODone (DESYREL) 100 MG tablet Take 100 mg by mouth every evening. 08/04/15  Yes Historical Provider, MD  XARELTO 20 MG TABS tablet Take 20 mg by mouth daily. 07/06/16  Yes Historical Provider, MD  acetaminophen-codeine (TYLENOL #3) 300-30 MG tablet Take 1-2 tablets by mouth every 6 (six) hours as needed for moderate pain. Patient not taking: Reported on 11/05/2016 07/31/16   Omar Person, NP  azithromycin [facility]) 250 MG tablet Take 2 tablets (500 mg total) by mouth every Monday, Wednesday, and Friday. Patient not taking: Reported on 11/05/2016 08/13/16   Donita Brooks, NP   Allergies  Allergen Reactions  . [place] [Aztreonam] Anaphylaxis  . Cefepime Itching, Nausea Only and Other (See Comments)    Headaches also  . Tobramycin Tinitus    Pt has received Tobramycin at [facility] since this allergy documented  . Banana Itching and Nausea And Vomiting  . Theophyllines Itching    Itchy throat    FAMILY HISTORY:  family history is not on file. SOCIAL HISTORY:  reports that he has never smoked. He has never used smokeless tobacco. He reports that he does not drink alcohol.  REVIEW OF SYSTEMS:   Bolds are positive  Constitutional: weight loss, gain, night sweats, Fevers, chills, fatigue .  HEENT: headaches, Sore throat, sneezing, nasal congestion, post nasal drip, Difficulty swallowing, Tooth/dental problems, visual complaints visual changes, ear ache CV:  chest pain,  radiates:,Orthopnea, PND, swelling in lower extremities, dizziness, palpitations, syncope.  GI  heartburn, indigestion, abdominal pain, nausea, vomiting, diarrhea, change in bowel habits, loss of appetite, bloody stools.  Resp: cough, productive:dark green sputum, hemoptysis, dyspnea worse on exertion, chest pain, pleuritic.  Skin: rash or itching or icterus GU: dysuria, change in color of urine, urgency or frequency. flank pain, hematuria  MS: joint pain or swelling. decreased range of motion  Psych: change in mood or affect. depression or anxiety.  Neuro: difficulty with speech, weakness, numbness, ataxia    SUBJECTIVE:   VITAL SIGNS: Temp:  [97.6 F (36.4 C)-98.2 F (36.8 C)] 98.2 F (36.8 C) (03/27 0125) Pulse Rate:  [80-96] 91 (03/27 0125) Resp:  [13-33] 24 (03/27 0030) BP: (119-141)/(69-109) 130/77 (03/27 0125) SpO2:  [95 %-100 %] 99 % (03/27 0125) Weight:  [117.5 kg (259 lb)-117.5 kg (259 lb 1.6 oz)] 117.5 kg (259 lb 1.6 oz) (03/27 0125)  PHYSICAL EXAMINATION: General:  Overweight young male in NAD Neuro:  Alert, oriented, non-focal HEENT:  [place]/AT, PERRL, no JVD Cardiovascular:  RRR, no MRG Lungs:  Coarse rhonchi on R, clear L. Resp  even unlabored on 2L Abdomen:  Soft, non-tender, non-distended. BS normoactive Musculoskeletal:  No acute deformity or ROM limitation Skin:  Grossly intact   Recent Labs Lab 11/05/16 2135 11/05/16 2147  NA 136 139  K 3.2* 3.2*  CL 100* 103  CO2 22  --   BUN 12 15  CREATININE 1.00 0.90  GLUCOSE 91 96    Recent Labs Lab 11/05/16 2135 11/05/16 2147  HGB 14.0 13.9  HCT 39.7 41.0  WBC 8.4  --   PLT 345  --    Dg Chest Port 1 View  Result Date: 11/05/2016 CLINICAL DATA:  Cystic fibrosis flare up EXAM: PORTABLE CHEST 1 VIEW COMPARISON:  Cough 16 17 FINDINGS: The heart size and mediastinal contours are within normal limits. Right upper lobe bronchiectasis is stable in appearance. No acute pulmonic consolidation, effusion or  pulmonary edema. Port catheter tip is seen from right-sided approach terminating in the right atrium. The visualized skeletal structures are unremarkable. IMPRESSION: Chronic bronchiectasis in the right upper lobe. No acute pulmonary consolidation, edema, pneumothorax nor effusion. Electronically Signed   By: Ashley Royalty M.D.   On: 11/05/2016 22:27    ASSESSMENT / PLAN:  31 year old male admitted with likely CF flare after 48 hours of worsening cough, dyspnea, pleuritic chest pain.   Acute exacerbation of cystic fibrosis - Supplemental O2 to keep sats > 92% - Would recommend gentamycin and ceftazidine, but ultimately would prefer ID consultation.  - Continue home Symdeko, Pulmozyme. - Budesonide nebs in lieu of home symbicort - Scheduled albuterol nebs - continue home Singulair - Pulmonary hygiene with incentive spirometry and flutter valve, hypertonic saline nebs (home med), and consider chest PT - AM team please contact primary pulmonologist Dr Cecelia Byars at [facility].  MAI in setting of CF - Continue PO azithromycin and inhaled amikacin.  Pleuritic chest pain -  PRN toradol, morphine   DM - per primary  Georgann Housekeeper, AGACNP-BC [facility] Pulmonology/Critical Care Pager [phone] or [phone]  11/06/2016 3:35 AM     STAFF NOTE: Linwood Dibbles, MD FACP have personally reviewed patient's available data, including medical history, events of note, physical examination and test results as part of my evaluation. I have discussed with resident/NP and other care providers such as pharmacist, RN and RRT. In addition, I personally evaluated patient and elicited key findings of: awake, alert, no distress, no upper airway sounds, coarse rul anterior, abdo soft, no liver border, clearly has NEW sputum increase and color to green in setting recent Tx mac and CF, concern is pseudomonas, would rec double coverage, agree with allergy to meropenem, give tobra x 1, (tinnititus years ago)  and HAVE ID consult in am , repeat PCXR in am , avoid high dose steroids as able, follow liver fxn, send sputum culture, will follow, mobilize secretions  Lavon Paganini. Titus Mould, MD, [place] Pgr: [place] [facility] 11/06/2016 4:03 AM

## 2016-11-06 NOTE — Progress Notes (Signed)
Seen by PCCM team early this am.  PHYSICAL EXAMINATION: General:  Young male wearing 2L [place], in NAD, awake and appropriate Neuro:  Intact, A&O x3, MAE x 4, appropriate HEENT:  [place]/AT, PERRL, no JVD Cardiovascular: regular rate and rhythm per monitor, no rub, murmur or gallop Lungs:  Coarse throughout,scattered rhonchi, saturations of 99% on 2 L [place] Abdomen:  Soft, non-tender, non-distended. BS normoactive Musculoskeletal:  No acute deformity or ROM limitation Skin:  Warm dry and intact, tattoos noted.  BP 127/68   Pulse 89   Temp 98.2 F (36.8 C) (Oral)   Resp (!) 24   Ht 6' (1.829 m)   Wt 259 lb 1.6 oz (117.5 kg)   SpO2 99%   BMI 35.14 kg/m    Checked on patient. He is in NAD. Discussed need for continued aggressive pulmonary hygeine. I have spoken with Lucita LoraSue Gray, NP [facility] [phone] Specific recommendations for airway clearance  Pulmonary hygiene for this patient includes:  AM:In this order 3% Saline Pulmozyne Chest PT Inhaled Amikacin PM: 3% saline Chest PT Inhaled Amikacin  Continue Azithro Clofazimine 100  mg once daily ( Study Drug) patient has given to pharmacy Inhaled Amikacin  ID consult ( [facility] stated they will call) Sputum Culture CXR in am  CMET in am to check LFT's  Rest per Plan per note 3/27 @ 0300.  Spent an hour coordinating care [facility] and CF coordinator.  Bevelyn NgoSarah F. Groce, AGACNP-BC  Pulmonary/Critical Care Medicine [phone] 11/06/2016   Alyson ReedyWesam G. Yacoub, M.D. [facility] Pulmonary/Critical Care Medicine. Pager: [phone]. After hours pager: [phone].

## 2016-11-06 NOTE — H&P (Signed)
History and Physical    James CampBenjamin Lynn Schoonmaker ZOX:[id number] DOB: [birth year] DOA: 11/05/2016  PCP: Pcp Not In System  Patient coming from: Home.  Chief Complaint: Cough shortness of breath and pleuritic-type of chest pain.  HPI: James Proctor is a 31 y.o. male with history of cystic fibrosis, hypertension and history of DVT on xarelto presents to the ER with complaints of cough or productive sputum subjective feeling of fever and chills and pleuritic type of chest pain over the last 24-48 hours. Patient has been having discolored sputum. Patient had gone to urgent care center and x-rays was concerning for pneumonia and was referred to the ER.   ED Course: In the ER x-ray shows right upper lobe bronchiectasis. Patient continued to have some pleuritic-type of chest pain and being admitted for exacerbation of bronchiectasis.  Review of Systems: As per HPI, rest all negative.   Past Medical History:  Diagnosis Date  . Cystic fibrosis (HCC)   . Hypertension     History reviewed. No pertinent surgical history.   reports that he has never smoked. He has never used smokeless tobacco. He reports that he does not drink alcohol. His drug history is not on file.  Allergies  Allergen Reactions  . [place] [Aztreonam] Anaphylaxis  . Cefepime Itching, Nausea Only and Other (See Comments)    Headaches also  . Tobramycin Tinitus    [facility] allergy documented  . Banana Itching and Nausea And Vomiting  . Theophyllines Itching    Itchy throat    Family History  Problem Relation Age of Onset  . Cystic fibrosis Neg Hx     Prior to Admission medications   Medication Sig Start Date End Date Taking? Authorizing Provider  albuterol (PROVENTIL HFA;VENTOLIN HFA) 108 (90 Base) MCG/ACT inhaler Inhale 1-2 puffs into the lungs every 6 (six) hours as needed for wheezing or shortness of breath.   Yes Historical Provider, MD  albuterol (PROVENTIL) (2.5  MG/3ML) 0.083% nebulizer solution Take 2.5 mg by nebulization 2 (two) times daily.   Yes Historical Provider, MD  azithromycin (ZITHROMAX) 500 MG tablet Take 500 mg by mouth daily. Continuous   Yes Historical Provider, MD  B Complex-C-Folic Acid (RENAL SOFTGELS PO) Take 1 tablet by mouth 2 (two) times daily. With D5000   Yes Historical Provider, MD  budesonide (PULMICORT) 0.5 MG/2ML nebulizer solution Take 0.5 mg by nebulization 2 (two) times daily.   Yes Historical Provider, MD  budesonide-formoterol (SYMBICORT) 160-4.5 MCG/ACT inhaler Inhale 2 puffs into the lungs 2 (two) times daily. 10/06/15  Yes Historical Provider, MD  cetirizine (ZYRTEC) 10 MG tablet Take 10 mg by mouth daily.   Yes Historical Provider, MD  CLOFAZIMINE PO Take 100 mg by mouth daily with lunch.    Yes Historical Provider, MD  dornase alpha (PULMOZYME) 1 MG/ML nebulizer solution Inhale 2.5 mg into the lungs daily. 11/29/15  Yes Historical Provider, MD  lisinopril (PRINIVIL,ZESTRIL) 5 MG tablet Take 5 mg by mouth daily. 11/29/15 11/28/16 Yes Historical Provider, MD  metoprolol succinate (TOPROL-XL) 25 MG 24 hr tablet Take 25 mg by mouth 2 (two) times daily.   Yes Historical Provider, MD  montelukast (SINGULAIR) 10 MG tablet Take 10 mg by mouth daily. 04/10/16 04/05/17 Yes Historical Provider, MD  omeprazole (PRILOSEC) 20 MG capsule Take 20 mg by mouth daily. 06/19/16  Yes Historical Provider, MD  Pancrelipase, Lip-Prot-Amyl, 40000 units CPEP Take 3-6 capsules by mouth See admin instructions. 6 capsules three  times a day with meals and 3 capsules with each snack 04/02/16  Yes Historical Provider, MD  PARoxetine (PAXIL) 10 MG tablet Take 20 mg by mouth daily.  04/10/16 04/05/17 Yes Historical Provider, MD  pramipexole (MIRAPEX) 0.125 MG tablet Take 0.25 mg by mouth at bedtime.  08/22/15  Yes Historical Provider, MD  pregabalin (LYRICA) 100 MG capsule Take 100 mg by mouth 2 (two) times daily.   Yes Historical Provider, MD  Probiotic Product  (PROBIOTIC PO) Take 1 tablet by mouth daily.   Yes Historical Provider, MD  Sodium Chloride, Inhalant, 7 % NEBU Take 4 mLs by nebulization 2 (two) times daily.  06/21/16  Yes Historical Provider, MD  Sodium Chloride-Sodium Bicarb 2300-700 MG PACK Place 1 packet into the nose 2 (two) times daily.   Yes Historical Provider, MD  Tezacaftor-Ivacaftor&Ivacaftor (SYMDEKO) 100-150 & 150 MG TBPK Take 1 tablet by mouth 2 (two) times daily.   Yes Historical Provider, MD  tobramycin, PF, (TOBI) 300 MG/5ML nebulizer solution Inhale 300 mg into the lungs 2 (two) times daily.  05/18/16  Yes Historical Provider, MD  traMADol (ULTRAM) 50 MG tablet Take 50 mg by mouth 3 (three) times daily as needed for moderate pain.   Yes Historical Provider, MD  traZODone (DESYREL) 100 MG tablet Take 100 mg by mouth every evening. 08/04/15  Yes Historical Provider, MD  XARELTO 20 MG TABS tablet Take 20 mg by mouth daily. 07/06/16  Yes Historical Provider, MD  acetaminophen-codeine (TYLENOL #3) 300-30 MG tablet Take 1-2 tablets by mouth every 6 (six) hours as needed for moderate pain. Patient not taking: Reported on 11/05/2016 07/31/16   Tobey Grim, NP  azithromycin [facility]) 250 MG tablet Take 2 tablets (500 mg total) by mouth every Monday, Wednesday, and Friday. Patient not taking: Reported on 11/05/2016 08/13/16   Jeanella Craze, NP    Physical Exam: Vitals:   11/05/16 2315 11/05/16 2345 11/06/16 0030 11/06/16 0125  BP: (!) 141/109 123/72 119/69 130/77  Pulse: 84 88 80 91  Resp: (!) 33 (!) 29 (!) 24   Temp:    98.2 F (36.8 C)  TempSrc:    Oral  SpO2: 98% 98% 98% 99%  Weight:    117.5 kg (259 lb 1.6 oz)  Height:    6' (1.829 m)      Constitutional: Moderately built and nourished. Vitals:   11/05/16 2315 11/05/16 2345 11/06/16 0030 11/06/16 0125  BP: (!) 141/109 123/72 119/69 130/77  Pulse: 84 88 80 91  Resp: (!) 33 (!) 29 (!) 24   Temp:    98.2 F (36.8 C)  TempSrc:    Oral  SpO2: 98% 98% 98% 99%    Weight:    117.5 kg (259 lb 1.6 oz)  Height:    6' (1.829 m)   Eyes: Anicteric no pallor. ENMT: No discharge from the ears eyes nose or mouth. Neck: No mass felt. No JVD appreciated. Respiratory: No rhonchi or crepitations. Cardiovascular: S1-S2 heard no murmurs appreciated. Abdomen: Soft nontender bowel sounds present. No guarding or rigidity. Musculoskeletal: No edema. No joint effusion. Skin: No rash. Skin appears warm. Neurologic: Alert awake oriented to time place and person. Moves all extremities. Psychiatric: Appears normal. Normal affect.   Labs on Admission: I have personally reviewed following labs and imaging studies  CBC:  Recent Labs Lab 11/05/16 2135 11/05/16 2147  WBC 8.4  --   NEUTROABS 4.3  --   HGB 14.0 13.9  HCT 39.7 41.0  MCV  81.4  --   PLT 345  --    Basic Metabolic Panel:  Recent Labs Lab 11/05/16 2135 11/05/16 2147  NA 136 139  K 3.2* 3.2*  CL 100* 103  CO2 22  --   GLUCOSE 91 96  BUN 12 15  CREATININE 1.00 0.90  CALCIUM 9.7  --    GFR: Estimated Creatinine Clearance: 161.8 mL/min (by C-G formula based on SCr of 0.9 mg/dL). Liver Function Tests: No results for input(s): AST, ALT, ALKPHOS, BILITOT, PROT, ALBUMIN in the last 168 hours. No results for input(s): LIPASE, AMYLASE in the last 168 hours. No results for input(s): AMMONIA in the last 168 hours. Coagulation Profile: No results for input(s): INR, PROTIME in the last 168 hours. Cardiac Enzymes: No results for input(s): CKTOTAL, CKMB, CKMBINDEX, TROPONINI in the last 168 hours. BNP (last 3 results) No results for input(s): PROBNP in the last 8760 hours. HbA1C: No results for input(s): HGBA1C in the last 72 hours. CBG: No results for input(s): GLUCAP in the last 168 hours. Lipid Profile: No results for input(s): CHOL, HDL, LDLCALC, TRIG, CHOLHDL, LDLDIRECT in the last 72 hours. Thyroid Function Tests: No results for input(s): TSH, T4TOTAL, FREET4, T3FREE, THYROIDAB in the last  72 hours. Anemia Panel: No results for input(s): VITAMINB12, FOLATE, FERRITIN, TIBC, IRON, RETICCTPCT in the last 72 hours. Urine analysis: No results found for: COLORURINE, APPEARANCEUR, LABSPEC, PHURINE, GLUCOSEU, HGBUR, BILIRUBINUR, KETONESUR, PROTEINUR, UROBILINOGEN, NITRITE, LEUKOCYTESUR Sepsis Labs: @LABRCNTIP (procalcitonin:4,lacticidven:4) )No results found for this or any previous visit (from the past 240 hour(s)).   Radiological Exams on Admission: Dg Chest Port 1 View  Result Date: 11/05/2016 CLINICAL DATA:  Cystic fibrosis flare up EXAM: PORTABLE CHEST 1 VIEW COMPARISON:  Cough 16 17 FINDINGS: The heart size and mediastinal contours are within normal limits. Right upper lobe bronchiectasis is stable in appearance. No acute pulmonic consolidation, effusion or pulmonary edema. Port catheter tip is seen from right-sided approach terminating in the right atrium. The visualized skeletal structures are unremarkable. IMPRESSION: Chronic bronchiectasis in the right upper lobe. No acute pulmonary consolidation, edema, pneumothorax nor effusion. Electronically Signed   By: Tollie Eth M.D.   On: 11/05/2016 22:27    EKG: Independently reviewed. Normal sinus rhythm.  Assessment/Plan Active Problems:   Cystic fibrosis exacerbation (HCC)   Pleuritic chest pain   Bronchiectasis (HCC)    1. Bronchiectasis exacerbation with history of cystic fibrosis - I have discussed with the on-call pulmonary critical care. Patient is placed on meropenem. Patient is already on antibiotics for NTM azithromycin and clofazimine. Follow sputum cultures. Consult infectious disease in a.m. May need to discuss with patient's pulmonologist in [facility] [place]. Patient was recently started on Symdeko. 2. History of DVT on xarelto. 3. Hypertension on metoprolol and lisinopril.   DVT prophylaxis: Xarelto. Code Status: Full code.  Family Communication: Patient's wife.  Disposition Plan: Home.    Consults called: Pulmonary critical care.  Admission status: Observation.    Eduard Clos MD [facility] Pager [phone].  If 7PM-7AM, please contact night-coverage [email or web address] Password [facility]  11/06/2016, 2:36 AM

## 2016-11-06 NOTE — Progress Notes (Signed)
RT Note: Kandice RobinsonsSarah Groce, NP wrote an order for instructions to respiratory therapy outlining what she wanted the patient to receive for his pulmonary toilet but she did not enter the orders. RT called Dr. Sunnie Nielsenegalado to see if she wanted to enter the orders for the patient and she said that critical care would be handling that. Rt paged CCM and spoke with Dr. Tim LairKassa who wants to have the physicians on his team to assess patient in the morning and enter any orders that are needed. RT will continue to monitor and assist as needed.

## 2016-11-06 NOTE — Progress Notes (Signed)
PROGRESS NOTE    James Proctor  ZOX:[id number] DOB: 1985-09-12 DOA: 11/05/2016 PCP: Pcp Not In System    Brief Narrative: James Proctor is a 31 y.o. male with history of cystic fibrosis, hypertension and history of DVT on xarelto presents to the ER with complaints of cough or productive sputum subjective feeling of fever and chills and pleuritic type of chest pain over the last 24-48 hours. Patient has been having discolored sputum. Patient had gone to urgent care center and x-rays was concerning for pneumonia and was referred to the ER.   In the ER x-ray shows right upper lobe bronchiectasis. Patient continued to have some pleuritic-type of chest pain and being admitted for exacerbation of bronchiectasis  Assessment & Plan:   Active Problems:   Cystic fibrosis exacerbation (HCC)   Pleuritic chest pain   Bronchiectasis (HCC)   Pain  Acute CF flare;  Continue with Sydeko, Pulmozyme Continue with Singulair, albuterol, budesonide.  IV antibiotics Meropenem and Tobramycin.  Will consult ID.  Appreciate Pulmonary help.   Hypokalemia; replaced orally.   History of DVT; continue with xarelto.    DVT prophylaxis:  xarelto Code Status: full code.  Family Communication: Care discussed with patient.  Disposition Plan: remain inpatient.    Consultants:   Pulmonary  ID   Procedures:   none   Antimicrobials:  Meropenem 3-27  Tobramycin 3-27   Subjective: He report feeling the same. Productive cough. Denies worsening dyspnea.   Objective: Vitals:   11/05/16 2345 11/06/16 0030 11/06/16 0125 11/06/16 0411  BP: 123/72 119/69 130/77 127/68  Pulse: 88 80 91 89  Resp: (!) 29 (!) 24    Temp:   98.2 F (36.8 C)   TempSrc:   Oral   SpO2: 98% 98% 99%   Weight:   117.5 kg (259 lb 1.6 oz)   Height:   6' (1.829 m)     Intake/Output Summary (Last 24 hours) at 11/06/16 0715 Last data filed at 11/06/16 0640  Gross per 24 hour  Intake           519.92 ml   Output                0 ml  Net           519.92 ml   Filed Weights   11/05/16 2120 11/05/16 2124 11/06/16 0125  Weight: 117.5 kg (259 lb) 117.5 kg (259 lb) 117.5 kg (259 lb 1.6 oz)    Examination:  General exam: Appears calm and comfortable  Respiratory system: Clear to auscultation. Respiratory effort normal. Cardiovascular system: S1 & S2 heard, RRR. No JVD, murmurs, rubs, gallops or clicks. No pedal edema. Gastrointestinal system: Abdomen is nondistended, soft and nontender. No organomegaly or masses felt. Normal bowel sounds heard. Central nervous system: Alert and oriented. No focal neurological deficits. Extremities: Symmetric 5 x 5 power. Skin: No rashes, lesions or ulcers Psychiatry: Judgement and insight appear normal. Mood & affect appropriate.     Data Reviewed: I have personally reviewed following labs and imaging studies  CBC:  Recent Labs Lab 11/05/16 2135 11/05/16 2147  WBC 8.4  --   NEUTROABS 4.3  --   HGB 14.0 13.9  HCT 39.7 41.0  MCV 81.4  --   PLT 345  --    Basic Metabolic Panel:  Recent Labs Lab 11/05/16 2135 11/05/16 2147  NA 136 139  K 3.2* 3.2*  CL 100* 103  CO2 22  --   GLUCOSE 91  96  BUN 12 15  CREATININE 1.00 0.90  CALCIUM 9.7  --    GFR: Estimated Creatinine Clearance: 161.8 mL/min (by C-G formula based on SCr of 0.9 mg/dL). Liver Function Tests: No results for input(s): AST, ALT, ALKPHOS, BILITOT, PROT, ALBUMIN in the last 168 hours. No results for input(s): LIPASE, AMYLASE in the last 168 hours. No results for input(s): AMMONIA in the last 168 hours. Coagulation Profile: No results for input(s): INR, PROTIME in the last 168 hours. Cardiac Enzymes:  Recent Labs Lab 11/06/16 0259  TROPONINI <0.03   BNP (last 3 results) No results for input(s): PROBNP in the last 8760 hours. HbA1C: No results for input(s): HGBA1C in the last 72 hours. CBG: No results for input(s): GLUCAP in the last 168 hours. Lipid Profile: No  results for input(s): CHOL, HDL, LDLCALC, TRIG, CHOLHDL, LDLDIRECT in the last 72 hours. Thyroid Function Tests: No results for input(s): TSH, T4TOTAL, FREET4, T3FREE, THYROIDAB in the last 72 hours. Anemia Panel: No results for input(s): VITAMINB12, FOLATE, FERRITIN, TIBC, IRON, RETICCTPCT in the last 72 hours. Sepsis Labs: No results for input(s): PROCALCITON, LATICACIDVEN in the last 168 hours.  No results found for this or any previous visit (from the past 240 hour(s)).       Radiology Studies: Dg Chest Port 1 View  Result Date: 11/05/2016 CLINICAL DATA:  Cystic fibrosis flare up EXAM: PORTABLE CHEST 1 VIEW COMPARISON:  Cough 16 17 FINDINGS: The heart size and mediastinal contours are within normal limits. Right upper lobe bronchiectasis is stable in appearance. No acute pulmonic consolidation, effusion or pulmonary edema. Port catheter tip is seen from right-sided approach terminating in the right atrium. The visualized skeletal structures are unremarkable. IMPRESSION: Chronic bronchiectasis in the right upper lobe. No acute pulmonary consolidation, edema, pneumothorax nor effusion. Electronically Signed   By: Tollie Eth M.D.   On: 11/05/2016 22:27        Scheduled Meds: . albuterol  2.5 mg Nebulization Q6H  . azithromycin  500 mg Oral Daily  . budesonide  0.5 mg Nebulization BID  . dornase alpha  2.5 mg Inhalation Daily  . Investigational - Study Medication  100 mg Oral Q1200  . lipase/protease/amylase  216,000 Units Oral TID WC  . lisinopril  5 mg Oral Daily  . loratadine  10 mg Oral Daily  . meropenem (MERREM) IV  2 g Intravenous Q8H  . metoprolol succinate  25 mg Oral BID  . montelukast  10 mg Oral Daily  . pantoprazole  40 mg Oral Daily  . PARoxetine  20 mg Oral Daily  . potassium chloride  40 mEq Oral Once  . pramipexole  0.25 mg Oral QHS  . pregabalin  100 mg Oral BID  . rivaroxaban  20 mg Oral Q supper  . sodium chloride  2 g Oral BID WC   And  . sodium  bicarbonate  650 mg Oral BID WC  . Sodium Chloride (Inhalant)  4 mL Nebulization BID  . Tezacaftor-Ivacaftor&Ivacaftor  1 tablet Oral BID  . tobramycin  200 mg Intravenous Q8H  . traZODone  100 mg Oral QPM   Continuous Infusions: . sodium chloride 50 mL/hr at 11/06/16 0411     LOS: 0 days    Time spent: 35 minutes.     Alba Cory, MD [facility] Pager [phone]  If 7PM-7AM, please contact night-coverage [email or web address] Password [facility] 11/06/2016, 7:15 AM

## 2016-11-06 NOTE — Progress Notes (Addendum)
Pharmacy Antibiotic Consult Note  James Proctor is a 31 y.o. male admitted on 11/05/2016 with cystic fibrosis and concern for PNA.  Pharmacy has been consulted for meropenem and tobramycin dosing.  Of note pt has complex medication regimen.  Plan: Merrem 2g IV Q8H. Tobramycin  IV x1 followed by  IV Q8H.  Height: 6' (182.9 cm) Weight: 259 lb 1.6 oz (117.5 kg) IBW/kg (Calculated) : 77.6  Adjusted BW: 95kg  Temp (24hrs), Avg:97.9 F (36.6 C), Min:97.6 F (36.4 C), Max:98.2 F (36.8 C)   Recent Labs Lab 11/05/16 2135 11/05/16 2147  WBC 8.4  --   CREATININE 1.00 0.90    Estimated Creatinine Clearance: 161.8 mL/min (by C-G formula based on SCr of 0.9 mg/dL).    Allergies  Allergen Reactions  . [place] [Aztreonam] Anaphylaxis  . Cefepime Itching, Nausea Only and Other (See Comments)    Headaches also  . Tobramycin Tinitus    Pt has received Tobramycin at [facility] since this allergy documented  . Banana Itching and Nausea And Vomiting  . Theophyllines Itching    Itchy throat     Thank you for allowing pharmacy to be a part of this patient's care.  Vernard Gambles, PharmD, BCPS  11/06/2016 3:11 AM

## 2016-11-06 NOTE — Consult Note (Signed)
[facility]    Date of Admission:  11/05/2016           Day 2 meropenem        Day 2 tobramycin       Reason for Consult: Acute exacerbation of pulmonary cystic fibrosis    Referring Physician: Dr. Hartley Barefoot  Principal Problem:   Cystic fibrosis exacerbation (HCC) Active Problems:   Mycobacterium abscessus infection   Pleuritic chest pain   Bronchiectasis (HCC)   Pain   Cystic fibrosis (HCC)   . albuterol  2.5 mg Nebulization Q6H  . azithromycin  500 mg Oral Daily  . budesonide  0.5 mg Nebulization BID  . dornase alpha  2.5 mg Inhalation Daily  . Investigational - Study Medication  100 mg Oral Q1200  . lipase/protease/amylase  216,000 Units Oral TID WC  . lisinopril  5 mg Oral Daily  . loratadine  10 mg Oral Daily  . meropenem (MERREM) IV  2 g Intravenous Q8H  . metoprolol succinate  25 mg Oral BID  . montelukast  10 mg Oral Daily  . pantoprazole  40 mg Oral Daily  . PARoxetine  20 mg Oral Daily  . pramipexole  0.25 mg Oral QHS  . pregabalin  100 mg Oral BID  . rivaroxaban  20 mg Oral Q supper  . sodium chloride  2 g Oral BID WC   And  . sodium bicarbonate  650 mg Oral BID WC  . Sodium Chloride (Inhalant)  4 mL Nebulization BID  . Tezacaftor-Ivacaftor&Ivacaftor  1 tablet Oral BID  . tobramycin  200 mg Intravenous Q8H  . traZODone  100 mg Oral QPM    Recommendations: 1. Agree with meropenem and tobramycin pending further observation and sputum culture results 2. Continue current 3 drug regimen for Mycobacterium abscessus (azithromycin, clofazimine and inhaled amikacin)   Assessment: I suspect this is more likely to be an acute exacerbation with Pseudomonas than a flare of his Mycobacterium infection. Mycobacterium abscesses is a smoldering infection rather than something that is likely to cause acute onset of severe symptoms. I agree with empiric meropenem and IV tobramycin for now.    HPI: James Proctor is a 31  y.o. male with cystic fibrosis who is followed by Dr. Waverly Ferrari at the [facility]. He has had multiple pulmonary exacerbations and has been treated on many occasions for Pseudomonas infection. Last year he was diagnosed with Mycobacterium abscessus pulmonary infection. AFB cultures were positive last April, August and again in January. He started on IV imipenem, IV amikacin and oral azithromycin on 08/23/2016. When he last saw Dr. Waverly Ferrari on 10/23/2016 this regimen was changed to oral azithromycin, clofazimine and inhaled amikacin. He has not been able to start the amikacin because he could not get it from his pharmacy. He has not had any other changes in his medications recently. He was admitted here yesterday because he had worsening hypoxia, pleuritic chest pain and cough productive of dark green sputum. He has had subjective fever and chills.   Review of Systems: Review of Systems  Constitutional: Positive for chills and fever. Negative for diaphoresis, malaise/fatigue and weight loss.  HENT: Negative for sore throat.   Respiratory: Positive for cough, sputum production and shortness of breath. Negative for hemoptysis and wheezing.   Cardiovascular: Positive for chest pain.  Gastrointestinal: Negative for abdominal pain, diarrhea, heartburn, nausea and vomiting.  Genitourinary: Negative for dysuria and frequency.  Musculoskeletal: Negative for joint pain and myalgias.  Skin: Negative for rash.  Neurological: Negative for dizziness and headaches.    Past Medical History:  Diagnosis Date  . Cystic fibrosis (HCC)   . Hypertension     Social History  Substance Use Topics  . Smoking status: Never Smoker  . Smokeless tobacco: Never Used  . Alcohol use No    Family History  Problem Relation Age of Onset  . Cystic fibrosis Neg Hx    Allergies  Allergen Reactions  . [place] [Aztreonam] Anaphylaxis  . Cefepime Itching, Nausea Only and Other (See Comments)    Headaches also    . Tobramycin Tinitus    Pt has [facility] since this allergy documented  . Banana Itching and Nausea And Vomiting  . Theophyllines Itching    Itchy throat    OBJECTIVE: Blood pressure (!) 105/50, pulse (!) 108, temperature 98.2 F (36.8 C), temperature source Oral, resp. rate 18, height 6' (1.829 m), weight 259 lb 1.6 oz (117.5 kg), SpO2 99 %.  Physical Exam  Constitutional: He is oriented to person, place, and time.  He is alert and in no distress sitting up in bed watching television.  HENT:  Mouth/Throat: No oropharyngeal exudate.  Eyes: Conjunctivae are normal.  Cardiovascular: Normal rate and regular rhythm.   No murmur heard. Pulmonary/Chest: Effort normal. He has rales.  He is using nasal prong oxygen.  Abdominal: Soft. He exhibits no mass. There is no tenderness.  Musculoskeletal: Normal range of motion.  Neurological: He is alert and oriented to person, place, and time.  Skin: No rash noted.  Psychiatric: Mood and affect normal.    Lab Results Lab Results  Component Value Date   WBC 18.8 (H) 11/06/2016   HGB 13.5 11/06/2016   HCT 39.7 11/06/2016   MCV 82.9 11/06/2016   PLT 302 11/06/2016    Lab Results  Component Value Date   CREATININE 1.16 11/06/2016   BUN 20 11/06/2016   NA 134 (L) 11/06/2016   K 4.0 11/06/2016   CL 98 (L) 11/06/2016   CO2 21 (L) 11/06/2016    Lab Results  Component Value Date   ALT 43 07/30/2016   AST 30 07/30/2016   ALKPHOS 73 07/30/2016   BILITOT 0.8 07/30/2016     Microbiology: No results found for this or any previous visit (from the past 240 hour(s)).  Cliffton AstersJohn Tiara Bartoli, MD [facility] [phone] pager   [phone] cell 11/06/2016, 5:32 PM

## 2016-11-06 NOTE — Progress Notes (Signed)
RT Note: Patient already had a flutter valve at bedside. He demonstrated technique well and had no questions. Rt will continue to monitor.

## 2016-11-07 ENCOUNTER — Inpatient Hospital Stay (HOSPITAL_COMMUNITY): Payer: BLUE CROSS/BLUE SHIELD

## 2016-11-07 LAB — SPIROMETRY WITH GRAPH
FEF 25-75 Post: 3.17 L/sec
FEF 25-75 Pre: 3.14 L/sec
FEF2575-%Change-Post: 0 %
FEF2575-%Pred-Post: 66 %
FEF2575-%Pred-Pre: 66 %
FEV1-%Change-Post: 0 %
FEV1-%PRED-PRE: 73 %
FEV1-%Pred-Post: 74 %
FEV1-PRE: 3.49 L
FEV1-Post: 3.53 L
FEV1FVC-%Change-Post: 1 %
FEV1FVC-%Pred-Pre: 94 %
FEV6-%CHANGE-POST: 0 %
FEV6-%PRED-POST: 76 %
FEV6-%PRED-PRE: 76 %
FEV6-POST: 4.42 L
FEV6-Pre: 4.39 L
FEV6FVC-%CHANGE-POST: 0 %
FEV6FVC-%PRED-POST: 99 %
FEV6FVC-%Pred-Pre: 98 %
FVC-%Change-Post: 0 %
FVC-%PRED-PRE: 76 %
FVC-%Pred-Post: 76 %
FVC-POST: 4.48 L
FVC-PRE: 4.49 L
POST FEV6/FVC RATIO: 99 %
Post FEV1/FVC ratio: 79 %
Pre FEV1/FVC ratio: 78 %
Pre FEV6/FVC Ratio: 98 %

## 2016-11-07 LAB — COMPREHENSIVE METABOLIC PANEL
ALK PHOS: 58 U/L (ref 38–126)
ALT: 29 U/L (ref 17–63)
AST: 23 U/L (ref 15–41)
Albumin: 3.3 g/dL — ABNORMAL LOW (ref 3.5–5.0)
Anion gap: 9 (ref 5–15)
BUN: 22 mg/dL — AB (ref 6–20)
CALCIUM: 8.5 mg/dL — AB (ref 8.9–10.3)
CO2: 28 mmol/L (ref 22–32)
Chloride: 103 mmol/L (ref 101–111)
Creatinine, Ser: 0.91 mg/dL (ref 0.61–1.24)
GFR calc Af Amer: >60 mL/min (ref >60)
GFR calc non Af Amer: >60 mL/min (ref >60)
GLUCOSE: 126 mg/dL — AB (ref 65–99)
Potassium: 4.4 mmol/L (ref 3.5–5.1)
Sodium: 140 mmol/L (ref 135–145)
Total Bilirubin: 0.6 mg/dL (ref 0.3–1.2)
Total Protein: 6.3 g/dL — ABNORMAL LOW (ref 6.5–8.1)

## 2016-11-07 LAB — CBC
HCT: 35.7 % — ABNORMAL LOW (ref 39.0–52.0)
HEMOGLOBIN: 11.8 g/dL — AB (ref 13.0–17.0)
MCH: 28.1 pg (ref 26.0–34.0)
MCHC: 33.1 g/dL (ref 30.0–36.0)
MCV: 85 fL (ref 78.0–100.0)
Platelets: 266 10*3/uL (ref 150–400)
RBC: 4.2 MIL/uL — ABNORMAL LOW (ref 4.22–5.81)
RDW: 15.4 % (ref 11.5–15.5)
WBC: 11.1 10*3/uL — ABNORMAL HIGH (ref 4.0–10.5)

## 2016-11-07 MED ORDER — AMIKACIN SULFATE 500 MG/2ML IJ SOLN: 500.0000 mg | Freq: Two times a day (BID) | INTRAMUSCULAR | Status: DC

## 2016-11-07 NOTE — Consult Note (Signed)
Name: James Proctor MRN: [id number] DOB: November 12, 1985    ADMISSION DATE:  11/05/2016 CONSULTATION DATE:  3/27  REFERRING MD :  Dr. Toniann Fail   CHIEF COMPLAINT:  Dyspnea  brief  31 year old male with PMH as below, which is significant for cystic fibrosis, HTN, DM, DVT on Xarelto and MAI currently treated with azithromycin PO and inhaled amikacin. He is followed at [facility] by Dr. Waverly Ferrari for his CF. 3/24 he began to experience profound dyspnea on exertion complicated by pleuritic chest pain. Associated symptoms include productive cough and subjective fevers while sleeping/night sweats. 3/26 he was checking his home pulse oximeter which demonstrated transient tachycardia to 130s and hypoxia to 80s on room air. He contacted his primary pulmonologist who referred him to ED. He presented to urgent care with these complaints and was felt to be developing a pneumonia so he was transferred to ED via EMS. He was given albuterol nebulizer and 125mg  solumedrol. Patient states symptoms are consistent with past flares of CF. He was admitted to the hospitalist team and PCCM was asked to see for further evaluation.  SIGNIFICANT EVENTS  PCXR 3/26 > Chronic changes, no acute infiltrate.   SUBJECTIVE/OVERNIGHT/INTERVAL HX 3/28 - improved. Resoled pleuritic pain. Wants to go home if possible  VITAL SIGNS: Temp:  [97.4 F (36.3 C)-98.8 F (37.1 C)] 98.8 F (37.1 C) (03/28 0936) Pulse Rate:  [84-108] 86 (03/28 0936) Resp:  [17-18] 18 (03/28 0936) BP: (105-133)/(50-77) 133/77 (03/28 0936) SpO2:  [96 %-99 %] 98 % (03/28 0936) Weight:  [119.7 kg (263 lb 12.8 oz)] 119.7 kg (263 lb 12.8 oz) (03/28 0500)  PHYSICAL EXAMINATION:   EXAM  General Appearance:    Overweight, loos well  Head:    Normocephalic, without obvious abnormality, atraumatic  Eyes:    PERRL - yes, conjunctiva/corneas - clear      Ears:    Normal external ear canals, both ears  Nose:   NG tube - no  Throat:  ETT TUBE - no , OG tube  - no  Neck:   Supple,  No enlargement/tenderness/nodules     Lungs:     Clear to auscultation bilaterally +  Chest wall:    No deformity  Heart:    S1 and S2 normal, no murmur, CVP - no.  Pressors - no  Abdomen:     Soft, no masses, no organomegaly  Genitalia:    Not done  Rectal:   not done  Extremities:   Extremities- no edema     Skin:   Intact in exposed areas .      Neurologic:   Sedation - none -> RASS - +1 . Moves all 4s - yes. CAM-ICU - neg . Orientation - x 3 +       LABS  PULMONARY  Recent Labs Lab 11/05/16 2147  TCO2 23    CBC  Recent Labs Lab 11/05/16 2135 11/05/16 2147 11/06/16 1129 11/07/16 0501  HGB 14.0 13.9 13.5 11.8*  HCT 39.7 41.0 39.7 35.7*  WBC 8.4  --  18.8* 11.1*  PLT 345  --  302 266    COAGULATION No results for input(s): INR in the last 168 hours.  CARDIAC   Recent Labs Lab 11/06/16 0259 11/06/16 1129 11/06/16 1559  TROPONINI <0.03 <0.03 <0.03   No results for input(s): PROBNP in the last 168 hours.   CHEMISTRY  Recent Labs Lab 11/05/16 2135 11/05/16 2147 11/06/16 1129 11/07/16 0501  NA 136 139 134* 140  K 3.2* 3.2* 4.0 4.4  CL 100* 103 98* 103  CO2 22  --  21* 28  GLUCOSE 91 96 203* 126*  BUN 12 15 20  22*  CREATININE 1.00 0.90 1.16 0.91  CALCIUM 9.7  --  9.3 8.5*   Estimated Creatinine Clearance: 161.4 mL/min (by C-G formula based on SCr of 0.91 mg/dL).   LIVER  Recent Labs Lab 11/07/16 0501  AST 23  ALT 29  ALKPHOS 58  BILITOT 0.6  PROT 6.3*  ALBUMIN 3.3*     INFECTIOUS No results for input(s): LATICACIDVEN, PROCALCITON in the last 168 hours.   ENDOCRINE CBG (last 3)  No results for input(s): GLUCAP in the last 72 hours.       IMAGING x48h  - image(s) personally visualized  -   highlighted in bold Dg Chest Port 1 View  Result Date: 11/07/2016 CLINICAL DATA:  Respiratory failure. EXAM: PORTABLE CHEST 1 VIEW COMPARISON:  11/05/2016. FINDINGS: Port-A-Cath noted with tip projected over  the right atrium. Cardiomegaly with normal pulmonary vascularity. Stable apical changes of bronchiectasis and scarring. No pleural effusion or pneumothorax. IMPRESSION: 1. Port-A-Cath noted with tip projected over right atrium. 2.  Stable right apical changes of bronchiectasis/ scarring . 3. Stable cardiomegaly . Electronically Signed   By: Maisie Fus  Register   On: 11/07/2016 07:55   Dg Chest Port 1 View  Result Date: 11/05/2016 CLINICAL DATA:  Cystic fibrosis flare up EXAM: PORTABLE CHEST 1 VIEW COMPARISON:  Cough 16 17 FINDINGS: The heart size and mediastinal contours are within normal limits. Right upper lobe bronchiectasis is stable in appearance. No acute pulmonic consolidation, effusion or pulmonary edema. Port catheter tip is seen from right-sided approach terminating in the right atrium. The visualized skeletal structures are unremarkable. IMPRESSION: Chronic bronchiectasis in the right upper lobe. No acute pulmonary consolidation, edema, pneumothorax nor effusion. Electronically Signed   By: Tollie Eth M.D.   On: 11/05/2016 22:27       ASSESSMENT and PLAN  Cystic fibrosis exacerbation (HCC) Improved significantly. Wants to go home but LOS 1 days only with IV Rx  Plan - get pft - Abx as below  - airway clearance as outline - dc in 1- 2days but not today  Anti-infectives    Start     Dose/Rate Route Frequency Ordered Stop   11/06/16 1200  tobramycin (NEBCIN) 200 mg in dextrose 5 % 50 mL IVPB     200 mg 110 mL/hr over 30 Minutes Intravenous Every 8 hours 11/06/16 0416     11/06/16 1000  azithromycin (ZITHROMAX) tablet 500 mg    Comments:  Continuous     500 mg Oral Daily 11/06/16 0235     11/06/16 0430  tobramycin (NEBCIN) 230 mg in dextrose 5 % 50 mL IVPB     230 mg 111.5 mL/hr over 30 Minutes Intravenous  Once 11/06/16 0416 11/06/16 0507   11/06/16 0400  meropenem (MERREM) 2 g in sodium chloride 0.9 % 100 mL IVPB     2 g 200 mL/hr over 30 Minutes Intravenous Every 8 hours  11/06/16 0314             FAMILY  - Updates: 11/07/2016 --> patient updated  - Inter-disciplinary family meet or Palliative Care meeting due by:  DAy 7. Current LOS is LOS 1 days  CODE STATUS    Code Status Orders        Start     Ordered   11/06/16 0235  Full code  Continuous     11/06/16 0235    Code Status History    Date Active Date Inactive Code Status Order ID Comments User Context   07/29/2016  2:20 AM 07/31/2016  4:19 PM Full Code [id number]  Jamie KatoAaron Trimble, MD ED    Advance Directive Documentation     Most Recent Value  Type of Advance Directive  Healthcare Power of Attorney  Pre-existing out of facility DNR order (yellow form or pink MOST form)  -  "MOST" Form in Place?  -        DISPO Keep in floor med surg    Dr. Kalman ShanMurali Tenesia Escudero, M.D., [facility].P Pulmonary and Critical Care Medicine Staff Physician [facility] Pager: [phone], If no answer or between  15:00h - 7:00h: call [phone]  11/07/2016 10:12 AM

## 2016-11-07 NOTE — Care Management Note (Signed)
Case Management Note  Patient Details  Name: James Proctor MRN: [id number] Date of Birth: [birth year]  Subjective/Objective:      Cystic Fibrosis, mycobacterium abscesses infection            Action/Plan: Discharge Planning: NCM spoke to pt and plans to switch his [facility]CP [facility] to a PCP in [place]. Provided pt with list of different PCP's in [place] from [employer] website. Also he can call toll free number on his card or go to [employer] website. Explained he will have to notify [facility] to make them aware he is transferring to new PCP. Pt will call to arrange follow up appt with new PCP.   Expected Discharge Date:                  Expected Discharge Plan:  Home/Self Care  In-House Referral:  NA  Discharge planning Services  CM Consult  Post Acute Care Choice:  NA Choice offered to:  NA  DME Arranged:  N/A DME Agency:  NA  HH Arranged:  NA HH Agency:  NA  Status of Service:  Completed, signed off  If discussed at Long Length of Stay Meetings, dates discussed:    Additional Comments:  Elliot CousinShavis, Gratia Disla Ellen, RN 11/07/2016, 5:20 PM

## 2016-11-07 NOTE — Progress Notes (Signed)
Patient ID: James Proctor, male   DOB: [birth year], 31 y.o.   MRN: [id number]          [facility]  Date of Admission:  11/05/2016           Day 3 meropenem        Day 3 tobramycin  Principal Problem:   Cystic fibrosis exacerbation (HCC) Active Problems:   Mycobacterium abscessus infection   Pleuritic chest pain   Bronchiectasis (HCC)   Pain   Cystic fibrosis (HCC)   . albuterol  2.5 mg Nebulization BID  . azithromycin  500 mg Oral Daily  . budesonide  0.5 mg Nebulization BID  . dornase alpha  2.5 mg Inhalation Daily  . Investigational - Study Medication  100 mg Oral Q1200  . lipase/protease/amylase  216,000 Units Oral TID WC  . lisinopril  5 mg Oral Daily  . loratadine  10 mg Oral Daily  . meropenem (MERREM) IV  2 g Intravenous Q8H  . metoprolol succinate  25 mg Oral BID  . montelukast  10 mg Oral Daily  . pantoprazole  40 mg Oral Daily  . PARoxetine  20 mg Oral Daily  . pramipexole  0.25 mg Oral QHS  . pregabalin  100 mg Oral BID  . rivaroxaban  20 mg Oral Q supper  . sodium chloride  2 g Oral BID WC   And  . sodium bicarbonate  650 mg Oral BID WC  . Sodium Chloride (Inhalant)  4 mL Nebulization BID  . Tezacaftor-Ivacaftor&Ivacaftor  1 tablet Oral BID  . tobramycin  200 mg Intravenous Q8H  . traZODone  100 mg Oral QPM    SUBJECTIVE: James Proctor is feeling much better and now back to baseline. James Proctor is very eager to go home.  Review of Systems: Review of Systems  Constitutional: Negative for chills, diaphoresis and fever.  Respiratory: Positive for cough, sputum production and shortness of breath. Negative for hemoptysis.   Cardiovascular: Negative for chest pain.  Gastrointestinal: Negative for abdominal pain, diarrhea, nausea and vomiting.    Past Medical History:  Diagnosis Date  . Cystic fibrosis (HCC)   . Hypertension     Social History  Substance Use Topics  . Smoking status: Never Smoker  . Smokeless tobacco: Never Used    . Alcohol use No    Family History  Problem Relation Age of Onset  . Cystic fibrosis Neg Hx    Allergies  Allergen Reactions  . [place] [Aztreonam] Anaphylaxis  . Cefepime Itching, Nausea Only and Other (See Comments)    Headaches also  . Tobramycin Tinitus    Pt has received Tobramycin at [facility] since this allergy documented  . Banana Itching and Nausea And Vomiting  . Theophyllines Itching    Itchy throat    OBJECTIVE: Vitals:   11/07/16 0500 11/07/16 0910 11/07/16 0936 11/07/16 1453  BP: (!) 114/59  133/77 128/78  Pulse: 84  86 81  Resp: 17  18 19   Temp: 97.4 F (36.3 C)  98.8 F (37.1 C) 98.6 F (37 C)  TempSrc: Oral  Oral Oral  SpO2: 99% 96% 98% 99%  Weight: 263 lb 12.8 oz (119.7 kg)     Height:       Body mass index is 35.78 kg/m.  Physical Exam  Constitutional: James Proctor is oriented to person, place, and time.  James Proctor is smiling and in good spirits.  Cardiovascular: Normal rate and regular rhythm.   No murmur heard. Pulmonary/Chest: Effort  normal. James Proctor has no wheezes. James Proctor has rales.  Abdominal: Soft.  Neurological: James Proctor is alert and oriented to person, place, and time.  Skin: No rash noted.  Psychiatric: Mood and affect normal.    Lab Results Lab Results  Component Value Date   WBC 11.1 (H) 11/07/2016   HGB 11.8 (L) 11/07/2016   HCT 35.7 (L) 11/07/2016   MCV 85.0 11/07/2016   PLT 266 11/07/2016    Lab Results  Component Value Date   CREATININE 0.91 11/07/2016   BUN 22 (H) 11/07/2016   NA 140 11/07/2016   K 4.4 11/07/2016   CL 103 11/07/2016   CO2 28 11/07/2016    Lab Results  Component Value Date   ALT 29 11/07/2016   AST 23 11/07/2016   ALKPHOS 58 11/07/2016   BILITOT 0.6 11/07/2016     Microbiology: No results found for this or any previous visit (from the past 240 hour(s)).   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 James Proctor has improved back to baseline on therapy targeting Pseudomonas. I would favor stopping meropenem and tobramycin now and having him return home on his 3 drug regimen for Mycobacterium abscesses infection. James Proctor has been taking oral azithromycin and clofazimine. James Proctor has arranged for his inhaled amikacin to be sent [place] the [facility] [place] so James Proctor should have a home by tomorrow.  PLAN: 1. Recommend discontinue meropenem and tobramycin and discharge home 2. Continue oral azithromycin and clofazimine and start inhaled amikacin once James Proctor gets home 3. [facility] [place] 4. James Proctor is requesting help establishing [place] in [place] 5. I will sign off now  Cliffton AstersJohn Levelle Edelen, MD [facility] [phone] pager   [phone] cell 11/07/2016, 3:30 PM

## 2016-11-07 NOTE — Assessment & Plan Note (Addendum)
Improved significantly. And per his report  PFTs likely his baseline. Clinically non toxic. ID changed him to po  Plan -airway clearance as usual  - abx as below - pccm will sign off - [facility] 11/09/16  Anti-infectives    Start     Dose/Rate Route Frequency Ordered Stop   11/07/16 1800  amikacin (AMIKIN) injection 500 mg  Status:  Discontinued     500 mg Inhalation Every 12 hours 11/07/16 1527 11/07/16 1527   11/06/16 1200  tobramycin (NEBCIN) 200 mg in dextrose 5 % 50 mL IVPB     200 mg 110 mL/hr over 30 Minutes Intravenous Every 8 hours 11/06/16 0416     11/06/16 1000  azithromycin (ZITHROMAX) tablet 500 mg    Comments:  Continuous     500 mg Oral Daily 11/06/16 0235     11/06/16 0430  tobramycin (NEBCIN) 230 mg in dextrose 5 % 50 mL IVPB     230 mg 111.5 mL/hr over 30 Minutes Intravenous  Once 11/06/16 0416 11/06/16 0507   11/06/16 0400  meropenem (MERREM) 2 g in sodium chloride 0.9 % 100 mL IVPB     2 g 200 mL/hr over 30 Minutes Intravenous Every 8 hours 11/06/16 0314

## 2016-11-07 NOTE — Discharge Instructions (Signed)

## 2016-11-07 NOTE — Progress Notes (Addendum)
PROGRESS NOTE    James Proctor  MWU:[id number] DOB: 14-Jan-1986 DOA: 11/05/2016 PCP: Pcp Not In System    Brief Narrative: James Proctor is a 31 y.o. male with history of cystic fibrosis, hypertension and history of DVT on xarelto presents to the ER with complaints of cough or productive sputum subjective feeling of fever and chills and pleuritic type of chest pain over the last 24-48 hours. Patient has been having discolored sputum. Patient had gone to urgent care center and x-rays was concerning for pneumonia and was referred to the ER.  In the ER x-ray shows right upper lobe bronchiectasis. Patient continued to have some pleuritic-type of chest pain and being admitted for exacerbation of bronchiectasis  Assessment & Plan:   Principal Problem:   Cystic fibrosis exacerbation (HCC) Active Problems:   Pleuritic chest pain   Bronchiectasis (HCC)   Pain   Cystic fibrosis (HCC)   Mycobacterium abscessus infection  Acute CF flare;  Continue with Sydeko, Pulmozyme Continue with Singulair, albuterol, budesonide.  Continue with IV antibiotics Meropenem and Tobramycin day 2.  ID consulted. Appreciate Dr Orvan Falconer help. Plan to continue with IV antibiotics.  Appreciate Pulmonary help. Recommending PFT for today.  Patient feeling much better, he denies dyspnea.  Discussed with Pharmacy regarding Amikacin Inhale. Dr Orvan Falconer will follow up on patient and give recommendation regarding Amikacin.  Continue with 3 % saline nebulizer.  WBC trending down.  Chest x ray stable.   Hypokalemia; Resolved.   History of DVT; on  xarelto.    DVT prophylaxis:  xarelto Code Status: full code.  Family Communication: Care discussed with patient.  Disposition Plan: remain inpatient.     Consultants:   Pulmonary  ID   Procedures:   none   Antimicrobials:  Meropenem 3-27  Tobramycin 3-27   Subjective: He is sitting in recliner. He is feeling much better, denies  dyspnea. Cough improved.  He was diagnosed with cystic fibrosis when he was 31 year old.  His pharmacist is delivering his Amikacin.    Objective: Vitals:   11/06/16 2155 11/07/16 0500 11/07/16 0910 11/07/16 0936  BP:  (!) 114/59  133/77  Pulse:  84  86  Resp:  17  18  Temp:  97.4 F (36.3 C)  98.8 F (37.1 C)  TempSrc:  Oral  Oral  SpO2: 99% 99% 96% 98%  Weight:  119.7 kg (263 lb 12.8 oz)    Height:        Intake/Output Summary (Last 24 hours) at 11/07/16 1300 Last data filed at 11/07/16 1222  Gross per 24 hour  Intake             1390 ml  Output                0 ml  Net             1390 ml   Filed Weights   11/05/16 2124 11/06/16 0125 11/07/16 0500  Weight: 117.5 kg (259 lb) 117.5 kg (259 lb 1.6 oz) 119.7 kg (263 lb 12.8 oz)    Examination:  General exam: sitting in recliner , in no distress.  Respiratory system: Clear to auscultation. Respiratory effort normal. No wheezing.  Cardiovascular system: S1 & S2 heard, RRR.  Gastrointestinal system: Abdomen is nondistended, soft and nontender. Normal bowel sounds heard. Central nervous system: Alert and oriented. No focal neurological deficits notice. Extremities: Symmetric 5 x 5 power. Skin: No rashes, lesions or ulcers Psychiatry: appropriate.  Data Reviewed: I have personally reviewed following labs and imaging studies  CBC:  Recent Labs Lab 11/05/16 2135 11/05/16 2147 11/06/16 1129 11/07/16 0501  WBC 8.4  --  18.8* 11.1*  NEUTROABS 4.3  --   --   --   HGB 14.0 13.9 13.5 11.8*  HCT 39.7 41.0 39.7 35.7*  MCV 81.4  --  82.9 85.0  PLT 345  --  302 266   Basic Metabolic Panel:  Recent Labs Lab 11/05/16 2135 11/05/16 2147 11/06/16 1129 11/07/16 0501  NA 136 139 134* 140  K 3.2* 3.2* 4.0 4.4  CL 100* 103 98* 103  CO2 22  --  21* 28  GLUCOSE 91 96 203* 126*  BUN 12 15 20  22*  CREATININE 1.00 0.90 1.16 0.91  CALCIUM 9.7  --  9.3 8.5*   GFR: Estimated Creatinine Clearance: 161.4 mL/min (by C-G  formula based on SCr of 0.91 mg/dL). Liver Function Tests:  Recent Labs Lab 11/07/16 0501  AST 23  ALT 29  ALKPHOS 58  BILITOT 0.6  PROT 6.3*  ALBUMIN 3.3*   No results for input(s): LIPASE, AMYLASE in the last 168 hours. No results for input(s): AMMONIA in the last 168 hours. Coagulation Profile: No results for input(s): INR, PROTIME in the last 168 hours. Cardiac Enzymes:  Recent Labs Lab 11/06/16 0259 11/06/16 1129 11/06/16 1559  TROPONINI <0.03 <0.03 <0.03   BNP (last 3 results) No results for input(s): PROBNP in the last 8760 hours. HbA1C: No results for input(s): HGBA1C in the last 72 hours. CBG: No results for input(s): GLUCAP in the last 168 hours. Lipid Profile: No results for input(s): CHOL, HDL, LDLCALC, TRIG, CHOLHDL, LDLDIRECT in the last 72 hours. Thyroid Function Tests: No results for input(s): TSH, T4TOTAL, FREET4, T3FREE, THYROIDAB in the last 72 hours. Anemia Panel: No results for input(s): VITAMINB12, FOLATE, FERRITIN, TIBC, IRON, RETICCTPCT in the last 72 hours. Sepsis Labs: No results for input(s): PROCALCITON, LATICACIDVEN in the last 168 hours.  No results found for this or any previous visit (from the past 240 hour(s)).       Radiology Studies: Dg Chest Port 1 View  Result Date: 11/07/2016 CLINICAL DATA:  Respiratory failure. EXAM: PORTABLE CHEST 1 VIEW COMPARISON:  11/05/2016. FINDINGS: Port-A-Cath noted with tip projected over the right atrium. Cardiomegaly with normal pulmonary vascularity. Stable apical changes of bronchiectasis and scarring. No pleural effusion or pneumothorax. IMPRESSION: 1. Port-A-Cath noted with tip projected over right atrium. 2.  Stable right apical changes of bronchiectasis/ scarring . 3. Stable cardiomegaly . Electronically Signed   By: Maisie Fushomas  Register   On: 11/07/2016 07:55   Dg Chest Port 1 View  Result Date: 11/05/2016 CLINICAL DATA:  Cystic fibrosis flare up EXAM: PORTABLE CHEST 1 VIEW COMPARISON:  Cough  16 17 FINDINGS: The heart size and mediastinal contours are within normal limits. Right upper lobe bronchiectasis is stable in appearance. No acute pulmonic consolidation, effusion or pulmonary edema. Port catheter tip is seen from right-sided approach terminating in the right atrium. The visualized skeletal structures are unremarkable. IMPRESSION: Chronic bronchiectasis in the right upper lobe. No acute pulmonary consolidation, edema, pneumothorax nor effusion. Electronically Signed   By: Tollie Ethavid  Kwon M.D.   On: 11/05/2016 22:27        Scheduled Meds: . albuterol  2.5 mg Nebulization BID  . azithromycin  500 mg Oral Daily  . budesonide  0.5 mg Nebulization BID  . dornase alpha  2.5 mg Inhalation Daily  . Investigational -  Study Medication  100 mg Oral Q1200  . lipase/protease/amylase  216,000 Units Oral TID WC  . lisinopril  5 mg Oral Daily  . loratadine  10 mg Oral Daily  . meropenem (MERREM) IV  2 g Intravenous Q8H  . metoprolol succinate  25 mg Oral BID  . montelukast  10 mg Oral Daily  . pantoprazole  40 mg Oral Daily  . PARoxetine  20 mg Oral Daily  . pramipexole  0.25 mg Oral QHS  . pregabalin  100 mg Oral BID  . rivaroxaban  20 mg Oral Q supper  . sodium chloride  2 g Oral BID WC   And  . sodium bicarbonate  650 mg Oral BID WC  . Sodium Chloride (Inhalant)  4 mL Nebulization BID  . Tezacaftor-Ivacaftor&Ivacaftor  1 tablet Oral BID  . tobramycin  200 mg Intravenous Q8H  . traZODone  100 mg Oral QPM   Continuous Infusions:    LOS: 1 day    Time spent: 35 minutes.     Alba Cory, MD [facility] Pager [phone]  If 7PM-7AM, please contact night-coverage [email or web address] Password [email or web address] 11/07/2016, 1:00 PM

## 2016-11-08 NOTE — Discharge Summary (Signed)
Physician Discharge Summary  James Proctor ZOX:[id number] DOB: August 09, 1986 DOA: 11/05/2016  PCP: Pcp Not In System  Admit date: 11/05/2016 Discharge date: 11/08/2016  Admitted From: Home  Disposition:  Home   Recommendations for Outpatient Follow-up:  1. Follow up with PCP in 1-2 weeks 2. Please obtain BMP/CBC in one week 3. Follow up with primary pulmonologist for further care of CF   Discharge Condition: Stable.  CODE STATUS: full code.  Diet recommendation: Heart Healthy   Brief/Interim Summary: James KyleBenjamin Lynn Griffithis a 31 y.o.malewith history of cystic fibrosis, hypertension and history of DVT on xarelto presents to the ER with complaints of cough or productive sputum subjective feeling of fever and chills and pleuritic type of chest pain over the last 24-48 hours. Patient has been having discolored sputum. Patient had gone to urgent care center and x-rays was concerning for pneumonia and was referred to the ER. In the ER x-ray shows right upper lobe bronchiectasis. Patient continued to have some pleuritic-type of chest pain and being admitted for exacerbation of bronchiectasis  Assessment & Plan:   Principal Problem:   Cystic fibrosis exacerbation (HCC) Active Problems:   Pleuritic chest pain   Bronchiectasis (HCC)   Pain   Cystic fibrosis (HCC)   Mycobacterium abscessus infection  Acute CF flare;  Continue with Sydeko, Pulmozyme Continue with Singulair, albuterol, budesonide.  Received  IV antibiotics Meropenem and Tobramycin day 3  ID consulted. Appreciate Dr Orvan Falconerampbell help.  Appreciate Pulmonary help. PFT stable.  Patient feeling much better, he denies dyspnea.  Continue with 3 % saline nebulizer.  WBC trending down.  Chest x ray stable.  discharge today off antibiotics. Patient to resume prior home regimen Stable for discharge today.   Hypokalemia; Resolved.   History of DVT; on  xarelto.    Discharge Diagnoses:  Principal Problem:    Cystic fibrosis exacerbation (HCC) Active Problems:   Pleuritic chest pain   Bronchiectasis (HCC)   Pain   Cystic fibrosis (HCC)   Mycobacterium abscessus infection    Discharge Instructions  Discharge Instructions    Diet - low sodium heart healthy    Complete by:  As directed    Increase activity slowly    Complete by:  As directed      Allergies as of 11/08/2016      Reactions   [place] [aztreonam] Anaphylaxis   Cefepime Itching, Nausea Only, Other (See Comments)   Headaches also   Tobramycin Tinitus   [facility] this allergy documented   Banana Itching, Nausea And Vomiting   Theophyllines Itching   Itchy throat      Medication List    STOP taking these medications   acetaminophen-codeine 300-30 MG tablet Commonly known as:  TYLENOL #3   budesonide 0.5 MG/2ML nebulizer solution Commonly known as:  PULMICORT   tobramycin (PF) 300 MG/5ML nebulizer solution Commonly known as:  TOBI     TAKE these medications    Patient was advised to resume taking Amikacin as prescribe by his pulmonologist.    albuterol (2.5 MG/3ML) 0.083% nebulizer solution Commonly known as:  PROVENTIL Take 2.5 mg by nebulization 2 (two) times daily.   albuterol 108 (90 Base) MCG/ACT inhaler Commonly known as:  PROVENTIL HFA;VENTOLIN HFA Inhale 1-2 puffs into the lungs every 6 (six) hours as needed for wheezing or shortness of breath.   azithromycin 500 MG tablet Commonly known as:  ZITHROMAX Take 500 mg by mouth daily. Continuous What changed:  Another medication  with the same name was removed. Continue taking this medication, and follow the directions you see here.   cetirizine 10 MG tablet Commonly known as:  ZYRTEC Take 10 mg by mouth daily.   CLOFAZIMINE PO Take 100 mg by mouth daily with lunch. Clofazimine 50 mg capsule (INVESTIGATIONAL STUDY DRUG) - dose is 100mg /2 capsules Take with food or milk   dornase alpha 1 MG/ML nebulizer  solution Commonly known as:  PULMOZYME Inhale 2.5 mg into the lungs daily.   lisinopril 5 MG tablet Commonly known as:  PRINIVIL,ZESTRIL Take 5 mg by mouth daily.   metoprolol succinate 25 MG 24 hr tablet Commonly known as:  TOPROL-XL Take 25 mg by mouth 2 (two) times daily.   montelukast 10 MG tablet Commonly known as:  SINGULAIR Take 10 mg by mouth daily.   omeprazole 20 MG capsule Commonly known as:  PRILOSEC Take 20 mg by mouth daily.   Pancrelipase (Lip-Prot-Amyl) 40000-136000 units Cpep Take 3-6 capsules by mouth See admin instructions. 6 capsules three times a day with meals and 3 capsules with each snack   PARoxetine 10 MG tablet Commonly known as:  PAXIL Take 20 mg by mouth daily.   pramipexole 0.125 MG tablet Commonly known as:  MIRAPEX Take 0.25 mg by mouth at bedtime.   pregabalin 100 MG capsule Commonly known as:  LYRICA Take 100 mg by mouth 2 (two) times daily.   PROBIOTIC PO Take 1 tablet by mouth daily.   RENAL SOFTGELS PO Take 1 tablet by mouth 2 (two) times daily. With D5000   Sodium Chloride (Inhalant) 7 % Nebu Take 4 mLs by nebulization 2 (two) times daily.   Sodium Chloride-Sodium Bicarb 2300-700 MG Pack Place 1 packet into the nose 2 (two) times daily.   SYMBICORT 160-4.5 MCG/ACT inhaler Generic drug:  budesonide-formoterol Inhale 2 puffs into the lungs 2 (two) times daily.   SYMDEKO 100-150 & 150 MG Tbpk Generic drug:  Tezacaftor-Ivacaftor&Ivacaftor Take 1 tablet by mouth 2 (two) times daily.   traMADol 50 MG tablet Commonly known as:  ULTRAM Take 50 mg by mouth 3 (three) times daily as needed for moderate pain.   traZODone 100 MG tablet Commonly known as:  DESYREL Take 100 mg by mouth every evening.   XARELTO 20 MG Tabs tablet Generic drug:  rivaroxaban Take 20 mg by mouth daily.      Follow-up Information    RAMASWAMY,MURALI, MD Follow up in 2 week(s).   Specialty:  Pulmonary Disease Contact information: [address]  [place] [address] [phone]          Allergies  Allergen Reactions  . [place] [Aztreonam] Anaphylaxis  . Cefepime Itching, Nausea Only and Other (See Comments)    Headaches also  . Tobramycin Tinitus    Pt has received Tobramycin at [facility] since this allergy documented  . Banana Itching and Nausea And Vomiting  . Theophyllines Itching    Itchy throat    Consultations:  Pulmonary   ID   Procedures/Studies: Dg Chest Port 1 View  Result Date: 11/07/2016 CLINICAL DATA:  Respiratory failure. EXAM: PORTABLE CHEST 1 VIEW COMPARISON:  11/05/2016. FINDINGS: Port-A-Cath noted with tip projected over the right atrium. Cardiomegaly with normal pulmonary vascularity. Stable apical changes of bronchiectasis and scarring. No pleural effusion or pneumothorax. IMPRESSION: 1. Port-A-Cath noted with tip projected over right atrium. 2.  Stable right apical changes of bronchiectasis/ scarring . 3. Stable cardiomegaly . Electronically Signed   By: Maisie Fus  Register  On: 11/07/2016 07:55   Dg Chest Port 1 View  Result Date: 11/05/2016 CLINICAL DATA:  Cystic fibrosis flare up EXAM: PORTABLE CHEST 1 VIEW COMPARISON:  Cough 16 17 FINDINGS: The heart size and mediastinal contours are within normal limits. Right upper lobe bronchiectasis is stable in appearance. No acute pulmonic consolidation, effusion or pulmonary edema. Port catheter tip is seen from right-sided approach terminating in the right atrium. The visualized skeletal structures are unremarkable. IMPRESSION: Chronic bronchiectasis in the right upper lobe. No acute pulmonary consolidation, edema, pneumothorax nor effusion. Electronically Signed   By: Tollie Eth M.D.   On: 11/05/2016 22:27     Subjective: Feeling better, eager to go home.   Discharge Exam: Vitals:   11/08/16 0554 11/08/16 0843  BP: 121/79 118/74  Pulse: 75   Resp:    Temp: 97.6 F (36.4 C)    Vitals:   11/07/16 2127 11/08/16 0554 11/08/16 0843 11/08/16 0944   BP: 120/62 121/79 118/74   Pulse: 85 75    Resp:      Temp: 97.9 F (36.6 C) 97.6 F (36.4 C)    TempSrc: Oral Oral    SpO2: 99% 99%  99%  Weight:  119.1 kg (262 lb 9.6 oz)    Height:        General: Pt is alert, awake, not in acute distress Cardiovascular: RRR, S1/S2 +, no rubs, no gallops Respiratory: CTA bilaterally, no wheezing, no rhonchi Abdominal: Soft, NT, ND, bowel sounds + Extremities: no edema, no cyanosis    The results of significant diagnostics from this hospitalization (including imaging, microbiology, ancillary and laboratory) are listed below for reference.     Microbiology: No results found for this or any previous visit (from the past 240 hour(s)).   Labs: BNP (last 3 results) No results for input(s): BNP in the last 8760 hours. Basic Metabolic Panel:  Recent Labs Lab 11/05/16 2135 11/05/16 2147 11/06/16 1129 11/07/16 0501  NA 136 139 134* 140  K 3.2* 3.2* 4.0 4.4  CL 100* 103 98* 103  CO2 22  --  21* 28  GLUCOSE 91 96 203* 126*  BUN 12 15 20  22*  CREATININE 1.00 0.90 1.16 0.91  CALCIUM 9.7  --  9.3 8.5*   Liver Function Tests:  Recent Labs Lab 11/07/16 0501  AST 23  ALT 29  ALKPHOS 58  BILITOT 0.6  PROT 6.3*  ALBUMIN 3.3*   No results for input(s): LIPASE, AMYLASE in the last 168 hours. No results for input(s): AMMONIA in the last 168 hours. CBC:  Recent Labs Lab 11/05/16 2135 11/05/16 2147 11/06/16 1129 11/07/16 0501  WBC 8.4  --  18.8* 11.1*  NEUTROABS 4.3  --   --   --   HGB 14.0 13.9 13.5 11.8*  HCT 39.7 41.0 39.7 35.7*  MCV 81.4  --  82.9 85.0  PLT 345  --  302 266   Cardiac Enzymes:  Recent Labs Lab 11/06/16 0259 11/06/16 1129 11/06/16 1559  TROPONINI <0.03 <0.03 <0.03   BNP: Invalid input(s): POCBNP CBG: No results for input(s): GLUCAP in the last 168 hours. D-Dimer No results for input(s): DDIMER in the last 72 hours. Hgb A1c No results for input(s): HGBA1C in the last 72 hours. Lipid Profile No  results for input(s): CHOL, HDL, LDLCALC, TRIG, CHOLHDL, LDLDIRECT in the last 72 hours. Thyroid function studies No results for input(s): TSH, T4TOTAL, T3FREE, THYROIDAB in the last 72 hours.  Invalid input(s): FREET3 Anemia work up No results  for input(s): VITAMINB12, FOLATE, FERRITIN, TIBC, IRON, RETICCTPCT in the last 72 hours. Urinalysis No results found for: COLORURINE, APPEARANCEUR, LABSPEC, PHURINE, GLUCOSEU, HGBUR, BILIRUBINUR, KETONESUR, PROTEINUR, UROBILINOGEN, NITRITE, LEUKOCYTESUR Sepsis Labs Invalid input(s): PROCALCITONIN,  WBC,  LACTICIDVEN Microbiology No results found for this or any previous visit (from the past 240 hour(s)).   Time coordinating discharge: Over 30 minutes  SIGNED:   Alba Cory, MD  [facility] 11/08/2016, 12:36 PM Pager   If 7PM-7AM, please contact night-coverage [email or web address] Password [email or web address]

## 2016-11-08 NOTE — Consult Note (Signed)
Name: James CampBenjamin Lynn Hiltner MRN: [id number] DOB: [birth year]    ADMISSION DATE:  11/05/2016 CONSULTATION DATE:  3/27  REFERRING MD :  Dr. Toniann FailKakrakandy   CHIEF COMPLAINT:  Dyspnea  brief  31 year old male with PMH as below, which is significant for cystic fibrosis, HTN, DM, DVT on Xarelto and MAI currently treated with azithromycin PO and inhaled amikacin. [facility] by Dr. Waverly FerrariFlume for his CF. 3/24 he began to experience profound dyspnea on exertion complicated by pleuritic chest pain. Associated symptoms include productive cough and subjective fevers while sleeping/night sweats. 3/26 he was checking his home pulse oximeter which demonstrated transient tachycardia to 130s and hypoxia to 80s on room air. He contacted his primary pulmonologist who referred him to ED. He presented to urgent care with these complaints and was felt to be developing a pneumonia so he was transferred to ED via EMS. He was given albuterol nebulizer and 125mg  solumedrol. Patient states symptoms are consistent with past flares of CF. He was admitted to the hospitalist team and PCCM was asked to see for further evaluation.  SIGNIFICANT EVENTS  PCXR 3/26 > Chronic changes, no acute infiltrate.  3/28 - improved. Resoled pleuritic pain. Wants to go home if possible   SUBJECTIVE/OVERNIGHT/INTERVAL HX 3/39- definitely wants to go home. On oral abx [facility]  PF below - feels this is baselien for him  Results for James CampGRIFFITH, Euriah LYNN (MRN [id number]) as of 11/08/2016 10:37  Ref. Range 11/06/2016 11:29 11/06/2016 15:59 11/07/2016 05:01 11/07/2016 06:58 11/07/2016 10:39  FVC-Pre Latest Units: L     4.49  FVC-%Pred-Pre Latest Units: %     76  FEV1-Pre Latest Units: L     3.49  FEV1-%Pred-Pre Latest Units: %     73  Pre FEV1/FVC ratio Latest Units: %     78  FEV1FVC-%Pred-Pre Latest Units: %     94  FEF 25-75 Pre Latest Units: L/sec     3.14  FEF2575-%Pred-Pre Latest Units: %     66  FEV6-Pre  Latest Units: L     4.39  FEV6-%Pred-Pre Latest Units: %     76  Pre FEV6/FVC Ratio Latest Units: %     98  FEV6FVC-%Pred-Pre Latest Units: %     98  FVC-Post Latest Units: L     4.48  FVC-%Pred-Post Latest Units: %     76  FVC-%Change-Post Latest Units: %     0  FEV1-Post Latest Units: L     3.53  FEV1-%Pred-Post Latest Units: %     74  FEV1-%Change-Post Latest Units: %     0  Post FEV1/FVC ratio Latest Units: %     79  FEV1FVC-%Change-Post Latest Units: %     1  FEF 25-75 Post Latest Units: L/sec     3.17  FEF2575-%Pred-Post Latest Units: %     66  FEF2575-%Change-Post Latest Units: %     0  FEV6-Post Latest Units: L     4.42  FEV6-%Pred-Post Latest Units: %     76  FEV6-%Change-Post Latest Units: %     0  Post FEV6/FVC ratio Latest Units: %     99  FEV6FVC-%Pred-Post Latest Units: %     99  FEV6FVC-%Change-Post Latest Units: %     0   VITAL SIGNS: Temp:  [97.6 F (36.4 C)-98.6 F (37 C)] 97.6 F (36.4 C) (03/29 0554) Pulse Rate:  [75-85] 75 (03/29 0554) Resp:  [  19] 19 (03/28 1453) BP: (118-128)/(62-79) 118/74 (03/29 0843) SpO2:  [99 %] 99 % (03/29 0944) Weight:  [119.1 kg (262 lb 9.6 oz)] 119.1 kg (262 lb 9.6 oz) (03/29 0554)  PHYSICAL EXAMINATION: EXAM  General Appearance:    Looks well OBESE - yes  Head:    Normocephalic, without obvious abnormality, atraumatic  Eyes:    PERRL - yes, conjunctiva/corneas - clear      Ears:    Normal external ear canals, both ears  Nose:   NG tube - no  Throat:  ETT TUBE - no , OG tube - no  Neck:   Supple,  No enlargement/tenderness/nodules     Lungs:     Clear to auscultation bilaterally, Mild RUL bronchial breath sound +  Chest wall:    No deformity  Heart:    S1 and S2 normal, no murmur, CVP - no.  Pressors - no  Abdomen:     Soft, no masses, no organomegaly  Genitalia:    Not done  Rectal:   not done  Extremities:   Extremities- intact     Skin:   Intact in exposed areas .      Neurologic:   Sedation - none -> RASS - +1 .  Moves all 4s - yes. CAM-ICU - neg . Orientation - x3+       LABS  PULMONARY  Recent Labs Lab 11/05/16 2147  TCO2 23    CBC  Recent Labs Lab 11/05/16 2135 11/05/16 2147 11/06/16 1129 11/07/16 0501  HGB 14.0 13.9 13.5 11.8*  HCT 39.7 41.0 39.7 35.7*  WBC 8.4  --  18.8* 11.1*  PLT 345  --  302 266    COAGULATION No results for input(s): INR in the last 168 hours.  CARDIAC   Recent Labs Lab 11/06/16 0259 11/06/16 1129 11/06/16 1559  TROPONINI <0.03 <0.03 <0.03   No results for input(s): PROBNP in the last 168 hours.   CHEMISTRY  Recent Labs Lab 11/05/16 2135 11/05/16 2147 11/06/16 1129 11/07/16 0501  NA 136 139 134* 140  K 3.2* 3.2* 4.0 4.4  CL 100* 103 98* 103  CO2 22  --  21* 28  GLUCOSE 91 96 203* 126*  BUN 12 15 20  22*  CREATININE 1.00 0.90 1.16 0.91  CALCIUM 9.7  --  9.3 8.5*   Estimated Creatinine Clearance: 161 mL/min (by C-G formula based on SCr of 0.91 mg/dL).   LIVER  Recent Labs Lab 11/07/16 0501  AST 23  ALT 29  ALKPHOS 58  BILITOT 0.6  PROT 6.3*  ALBUMIN 3.3*     INFECTIOUS No results for input(s): LATICACIDVEN, PROCALCITON in the last 168 hours.   ENDOCRINE CBG (last 3)  No results for input(s): GLUCAP in the last 72 hours.       IMAGING x48h  - image(s) personally visualized  -   highlighted in bold Dg Chest Port 1 View  Result Date: 11/07/2016 CLINICAL DATA:  Respiratory failure. EXAM: PORTABLE CHEST 1 VIEW COMPARISON:  11/05/2016. FINDINGS: Port-A-Cath noted with tip projected over the right atrium. Cardiomegaly with normal pulmonary vascularity. Stable apical changes of bronchiectasis and scarring. No pleural effusion or pneumothorax. IMPRESSION: 1. Port-A-Cath noted with tip projected over right atrium. 2.  Stable right apical changes of bronchiectasis/ scarring . 3. Stable cardiomegaly . Electronically Signed   By: Maisie Fus  Register   On: 11/07/2016 07:55       ASSESSMENT and PLAN  Cystic fibrosis  exacerbation (HCC) Improved significantly.  And per his report  PFTs likely his baseline. Clinically non toxic. ID changed him to po  Plan -airway clearance as usual  - abx as below - pccm will sign off - CF clinic at [facility] 11/09/16  Anti-infectives    Start     Dose/Rate Route Frequency Ordered Stop   11/07/16 1800  amikacin (AMIKIN) injection 500 mg  Status:  Discontinued     500 mg Inhalation Every 12 hours 11/07/16 1527 11/07/16 1527   11/06/16 1200  tobramycin (NEBCIN) 200 mg in dextrose 5 % 50 mL IVPB     200 mg 110 mL/hr over 30 Minutes Intravenous Every 8 hours 11/06/16 0416     11/06/16 1000  azithromycin (ZITHROMAX) tablet 500 mg    Comments:  Continuous     500 mg Oral Daily 11/06/16 0235     11/06/16 0430  tobramycin (NEBCIN) 230 mg in dextrose 5 % 50 mL IVPB     230 mg 111.5 mL/hr over 30 Minutes Intravenous  Once 11/06/16 0416 11/06/16 0507   11/06/16 0400  meropenem (MERREM) 2 g in sodium chloride 0.9 % 100 mL IVPB     2 g 200 mL/hr over 30 Minutes Intravenous Every 8 hours 11/06/16 0314             FAMILY  - Updates: 11/08/2016 --> patient updated  - Inter-disciplinary family meet or Palliative Care meeting due by:  DAy 7. Current LOS is LOS 2 days  CODE STATUS    Code Status Orders        Start     Ordered   11/06/16 0235  Full code  Continuous     11/06/16 0235    Code Status History    Date Active Date Inactive Code Status Order ID Comments User Context   07/29/2016  2:20 AM 07/31/2016  4:19 PM Full Code [id number]  Jamie Kato, MD ED    Advance Directive Documentation     Most Recent Value  Type of Advance Directive  Healthcare Power of Attorney  Pre-existing out of facility DNR order (yellow form or pink MOST form)  -  "MOST" Form in Place?  -        DISPO Per triad; ok for dc per PCCM    Dr. Kalman Shan, M.D., [facility].C.P Pulmonary and Critical Care Medicine Staff Physician [facility]  [facility] Pager: [phone], If no answer or between  15:00h - 7:00h: call [phone]  11/08/2016 10:38 AM

## 2016-11-08 NOTE — Progress Notes (Signed)
Patient refused bed alarm. Will continue to monitor patient. 

## 2016-11-08 NOTE — Progress Notes (Signed)
Discharge instructions reviewed with pt. Pt has no questions at this time. Pt stated he is going to schedule an appoint with Dr. Marchelle Gearingamaswamy. Iv D/C. Pt has no complaints at this time. Pt dc home with wife.

## 2017-02-05 ENCOUNTER — Inpatient Hospital Stay
Admission: EM | Admit: 2017-02-05 | Discharge: 2017-02-15 | Disposition: A | Discharge status: Home / Self Care | Payer: BC Managed Care – PPO | Source: Intra-hospital

## 2017-02-05 ENCOUNTER — Inpatient Hospital Stay
Admission: EM | Admit: 2017-02-05 | Discharge: 2017-02-15 | Disposition: A | Discharge status: Home / Self Care | Payer: BC Managed Care – PPO | Source: Intra-hospital | Attending: Infectious Disease | Admitting: Infectious Disease

## 2017-02-15 MED ORDER — TRAZODONE 100 MG TABLET
ORAL_TABLET | Freq: Every evening | ORAL | 0 refills | 0 days
Start: 2017-02-15 — End: 2017-04-06

## 2017-02-15 MED ORDER — SERTRALINE 100 MG TABLET
ORAL_TABLET | Freq: Every day | ORAL | 0 refills | 0 days
Start: 2017-02-15 — End: 2017-02-27

## 2017-02-15 MED ORDER — LIPASE-PROTEASE-AMYLASE 24,000-76,000-120,000 UNIT CAPSULE,DELAYED REL
ORAL_CAPSULE | Freq: Three times a day (TID) | ORAL | 0 refills | 0 days
Start: 2017-02-15 — End: 2017-02-21

## 2017-02-21 ENCOUNTER — Ambulatory Visit
Admission: RE | Admit: 2017-02-21 | Discharge: 2017-02-21 | Disposition: A | Discharge status: Home / Self Care | Payer: BC Managed Care – PPO

## 2017-02-21 ENCOUNTER — Ambulatory Visit
Admission: RE | Admit: 2017-02-21 | Discharge: 2017-02-21 | Disposition: A | Discharge status: Home / Self Care | Payer: BC Managed Care – PPO | Attending: Pulmonary Disease | Admitting: Pulmonary Disease

## 2017-02-21 DIAGNOSIS — A319 Mycobacterial infection, unspecified: Secondary | ICD-10-CM

## 2017-02-21 MED ORDER — LIPASE-PROTEASE-AMYLASE 36,000-114,000-180,000 UNIT CAPSULE,DELAY REL
ORAL_CAPSULE | 6 refills | 0 days | Status: CP
Start: 2017-02-21 — End: 2017-05-16

## 2017-02-26 NOTE — Unmapped (Addendum)
Pulmonary Cystic Fibrosis New Clinic Note      HISTORY:     Interval History:  Diagnosed at 31 with chronic infections, had consolidation and parapneumonic effusion,sent to [facility] for treatment since then.   [id number] and [id number] insertion.    - Recent hospitalization for malaise and increased breathlessness, has improved since in-house  - No current constitutional symptoms, he is compliant with his MABSC therapy (inh amikacin/clofaz/azith)  - He is using his HS 10/14 times/wk  - Pulmozyme 5/7x/wk  - More compliant with clearance and he is more aware now of how important this is  - On teza/iva with normal  LFTs   - He states his stools are fine to me but tells nutrition intermittently constipated and other times with greasy stools  - Patient believes he has a positive fecal fat in the past  - Eating fast food frequently, pt had a discussion with nutrition re: this  - Vest has broken and needs a new one  - Not on insulin  - Increased exercise since our last discussion now 3-4x/wk in gym for 30-45 mins with cardio  - No issue with sinuses  - Doing well from a psych standpoint, in therapy, finds this efficacious      Past Medical History:   Diagnosis Date   ??? Anxiety    ??? Chronic pain disorder    ??? Cystic fibrosis (CMS-HCC)    ??? Depression    ??? Hypertension      No past surgical history on file.    Other History:  The social history and family history were personally reviewed and updated in the patient's electronic medical record.    Home Medications:  Current Outpatient Prescriptions on File Prior to Visit   Medication Sig Dispense Refill   ??? albuterol (PROVENTIL HFA;VENTOLIN HFA) 90 mcg/actuation inhaler Inhale 2 puffs every six (6) hours as needed.     ??? albuterol 2.5 mg /3 mL (0.083 %) nebulizer solution Inhale 2.5 mg by nebulization Two (2) times a day.      ??? alcohol swabs PadM Use as directed with inhaled antibiotics 100 each 11   ??? amikacin (AMIKIN) 500 mg/2 mL injection Withdraw 2 mL (500mg ) and add to neb cup. Administer using nebulizer BID. 60 vial 11   ??? azithromycin (ZITHROMAX) 500 MG tablet Take 500 mg by mouth daily.     ??? budesonide-formoterol (SYMBICORT) 160-4.5 mcg/actuation inhaler Inhale 2 puffs Two (2) times a day.     ??? cetirizine (ZYRTEC) 10 MG tablet Take 10 mg by mouth daily.     ??? CLOFAZIMINE ORAL Take 100 mg by mouth daily.     ??? dornase alfa (PULMOZYME) 1 mg/mL nebulizer solution Inhale 2.5 mg daily.      ??? fluticasone (FLONASE) 50 mcg/actuation nasal spray 2 sprays by Each Nare route daily. 16 g 0   ??? lisinopril (PRINIVIL,ZESTRIL) 10 MG tablet Take 10 mg by mouth daily.      ??? melatonin 3 mg Tab Take 2 tablets (6 mg total) by mouth nightly.  0   ??? metoprolol succinate (TOPROL-XL) 25 MG 24 hr tablet Take 25 mg by mouth daily.      ??? montelukast (SINGULAIR) 10 mg tablet Take 10 mg by mouth nightly.     ??? NON FORMULARY MVW Probiotic (specifically for patients with cystic fibrosis) - 1 capsule daily     ??? NON FORMULARY MVW Complete Formulation D500 Multivitamin (specific for patients with cystic fibrosis) 1 capsule twice daily     ???  omeprazole (PRILOSEC) 20 MG capsule Take 20 mg by mouth daily.     ??? PARI LC D NEBULIZER Misc Provide 1 device  for use with inhaled medication. 1 each 11   ??? pramipexole (MIRAPEX) 0.125 MG tablet Take 0.25 mg by mouth daily.      ??? pregabalin (LYRICA) 100 MG capsule Take 200 mg by mouth Two (2) times a day.      ??? rivaroxaban (XARELTO) 20 mg tablet Take 20 mg by mouth daily with evening meal.     ??? sertraline (ZOLOFT) 100 MG tablet Take 1.5 tablets (150 mg total) by mouth daily. 1 tablet 0   ??? sodium bicarb-sodium chloride 700-2,300 mg Pack Irrigate each nostril with 120 mL of solution (1 packet mixed with 240 mL of distilled water) BID     ??? sodium chloride (NS) Soln Add 1mL NaCl into neb cup along with amikacin. 600 mL 11   ??? sodium chloride 7% 7 % Nebu Inhale 4 mL by nebulization Two (2) times a day.     ??? syringe with needle (SYRINGE 3CC/20GX1) 3 mL 20 gauge x 1 Syrg For use with inhaled antibiotic 60 Syringe 11   ??? tezacaftor 100mg /ivacaftor 150mg  and ivacaftor 150mg  (SYMDEKO) tablets Take 1 tablet (tezacaftor 100mg /ivacaftor 150mg ) by mouth every AM and 1 tablet (ivacaftor 150mg ) every PM with fatty food 56 tablet 3   ??? traZODone (DESYREL) 100 MG tablet Take 1.5 tablets (150 mg total) by mouth nightly. 1 tablet 0     No current facility-administered medications on file prior to visit.        Allergies:  Allergies as of 02/21/2017 - Reviewed 02/21/2017   Allergen Reaction Noted   ??? [place] [aztreonam lysine] Anaphylaxis 12/27/2016       Review of Systems:  Ten systems were reviewed and were negative except as indicated in the above history.    CF Overview:     Genetics:  [id number] and [id number] insertion    Airway Clearance Regimen:  HS BID  Dornase    Inhaled ABX:  None    Hemoptysis: No  ABPA:  No  Ptx:  No  Sinusitus: No    Panc Insuf: Yes  PEG:  No  DIOS:  HAs bowel issues  CF Liver Dz: No    Diabetes: Borderline  Osteopenia: N/A    Depression: Yes    Other Co-morbidities:  N/A    Social Setting:  Lives with wife near [place]  Remarkably depressed    CF Monitoring:     Pulmonary Function History:  Date: FVC (% Pred) FEV1 (% Pred) FEV1/FVC FEF25-75(% Pred) ABX / Tx   11/19/2016 4.36 (75.3) 3.30 (69.8) 76 2.74 (57.9)    11/26/2016 4.41 (76.1) 3.25 (68.7) 74 2.31 (48.9)    12/24/2016 4.66 (80.6) 3.45 (73.1) 74 2.57 (54.3)    02/21/2017 5.16 (88.1) 3.54 (74.2) 69 1.95 (41.0)        Micro History:    MABSC  - Assumed S to erythro from [employer]    Smooth PsA  S: Cipro, Levo, Tobra  ITonette Lederer, Ceftaz, PT    Exacerbation History:  Frequent from 2017 into 2018    BMI:   Body mass index is 35.37 kg/m??.    Last Sputum Cx: 12/2016  Last AFB:  12/2016    DEXA Scan:  Due  Result: --    Last IgE:  12/2016  Result: normal  Last LFT panel: 12/2016  Result: normal  Last Vit A/D/E: Due  Result: Due  Last INR:  12/2016  Result: normal  Last HbA1c:  6.1  Result: normal  Last OGTT:  Due  Result: Due PHYSICAL EXAM:   BP 128/76  - Pulse 77  - Temp 36.9 ??C (Oral)  - Wt (!) 115 kg (253 lb 9.6 oz)  - SpO2 94%  - BMI 35.37 kg/m??   A/Ox3, flat affect  NCAT  EOMI, PER  Neck is supple without masses, No JVP  Membranes moist and no oral lesions  S1/S2, rrr, no m/r/g  Lungs CTA B/L over posterior lung fields and apices  Abd soft, NT, BS+  No visible skin breakdown, rashes, or lesions  Moving all four extemities spontaneously, no tremor with movement, no ataxia or dysmetria  No LE edema B/L  No noted anxiety or depression      LABORATORY and RADIOLOGY DATA:     Pertinent Laboratory Data:  As above    Pertinent Imaging Data:  RUL>LUL fibrosis and volume loss    ASSESSMENT and PLAN     Laron Boorman is a 31 y.o. 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    Chest sx currently improved after hospital stay.    As per our last discussion, patient has improved his self-care, including more frequent HS and dornase, as well as exercise. This was a major issue when he came to the clinic. This is likely also helped by psych therapy. Congratulated and promoted this improvement.    For the patient's Mycobacterium abscesses lung disease. Negative culture in 11/2016 and negative smear from this visit. The patient received 6 weeks of intravenous amikacin and imipenem in January 2018 into February 2019, and is on oral azithromycin, clofazimine, and inhaled amikacin. The patient believes from his conversation with the folks at the medical [facility] that his Mycobacterium is susceptible to macrolides. I cannot find report of this and care everywhere. If we are to grow this nontuberculous mycobacterium, we'll be able to run sensitivities for institution. We'll continue the current outpatient regimen of inhaled amikacin, clofazimine, and azithromycin for at least one year (proposed end date of 11/2017)     For the patient's cystic fibrosis related sinus disease, currently having no sinus issues, on LTi.    The patient is on the border of having cystic fibrosis related diabetes, may need endo referral in near future.    Nutrition discussed bowel issues with him (he did not state there were issues to me) and we will consider fecal fat in the future.    Needs Dexa Scan    Vitamin levels at next blood draw    Teza/iva LFT monitoring on schedule      In the past, Ibn has tried the Acapella and [place], but found that they are not always effective for him in clearing his sputum from his lungs. He has done manual chest physiotherapy (CPT) in the past and it did not always work for him. However, he is an adult now and does not always have someone that can perform this on a regular basis. ??Darroll has a vest at home that is >53 years old and while he still uses it, he has found it to be ineffective for him in producing sputum. ??He stated that when his vest was new, 8 + years ago, that he was able to produce sputum with it.     The patient was seen with Dr. Lurena Nida.    Thurnell Garbe, PGY5  Pulmonary critical  care fellow  [phone]    February 26, 2017

## 2017-02-27 NOTE — Unmapped (Signed)
Pulmonary Clinic Pharmacist Note: Medication Side Effect    February 27, 2017 1:40 PM  Christian Young reporting chest tightness after inhaled amikacin treatment.  He does 2mL (500mg ) + 3mL NS.   Happens right after treatment, reports this started soon after his psych admission.  Has not tried pre-med with albuterol.    Recommended he try to do another full dose amikacin but to pre-med albuterol dose again right before treatment to see if it alleviates the chest tightness. If it continues, he will [facility] or call CPP.    CPP also updated home med list:  -Stopped sertraline  -Started Viibryd (vilazadone) 10mg  daily x 1 week, then increase to 20mg  daily  -Started Lamictal 25mg , daily x 1 week, then increase to twice a day.    [name], PharmD, BCPS, CPP  Clinical Pharmacist Practitioner  [facility]  Pager: [phone]      CC: [name], Norlene Duel, Serita Grit

## 2017-02-27 NOTE — Unmapped (Signed)
Pulmonary Clinic Pharmacist Note: Medication Management    February 27, 2017 3:29 PM  Spoke to Christian Young to confirm his supply of clofazimine.  He reports he has enough supply through August, was supplied by [facility].  Notified JD Tiburcio Pea and Velda Shell with research to initiate the process to help get Christian Young his refills through [facility].    [name], PharmD, BCPS, CPP  Clinical Pharmacist Practitioner  [facility]  Pager: [phone]      CC: Christian Young

## 2017-02-27 NOTE — Unmapped (Signed)
Faxed a prescription to [employer] for a vest.

## 2017-02-27 NOTE — Unmapped (Unsigned)
[facility] Refill Coordination Note: Pulmonary    Specialty Medication(s) and additional medications shipped:   -Symdeko 100/150mg  and 150mg   -Inhaled amikacin 500mg  and kit: sodium chloride 0.9%, syringes with needles, Pari nebulizer cup, alcohol swabs     Christian Young, DOB: March 13, 1986    [name] Active? No-not enrolled    Medication Adherence    Patient reported X missed doses in the last month:  0  Specialty Medication:  SYMDEKO  QOH 7 DAYS   Patient is on additional specialty medications:  Yes  Additional Specialty Medications:  AMIKACIN (KIT) QOH ABOUT 7 DAYS   Patient Reported Additional Medication X Missed Doses in the Last Month:  0  Informant:  patient  Reliability of informant:  fairly reliable  Confirmed plan for next specialty medication refill:  delivery by pharmacy  Medication Assistance Program  Refill Coordination  Has the Patient's Contact Information Changed:  No  Is the Shipping Address Different:  No  Shipping Information  Delivery Scheduled:  Yes  Delivery Date:  03/01/17  Medications to be Shipped:  SYMDEKO 03/01/17   AMIKACIN KIT 03/05/17              Adverse Effects    Chest pain:  Pos  Chest tightness:  Pos         -Confirmed the medication and dosage are correct and have not changed: Yes, regimen is correct and unchanged.  -Confirmed patient started or stopped the following medications in the past month:  No, there are no changes reported at this time.  -Are you tolerating your medication?:  REPORTS SIDE EFFECTS FROM AMIKACIN         ADDITIONAL NOTES          N/A           FINANCIAL/SHIPPING     Delivery Scheduled: Yes, Expected medication delivery date: 03/01/17             Jolene Schimke  Specialty Pharmacy Technician

## 2017-02-28 MED FILL — SYMDEKO/100-150/TBPK: SYMDEKO/100-150/TBPK | 28 days supply | Qty: 56 | Fill #2

## 2017-02-28 NOTE — Unmapped (Signed)
02/28/17    Spoke with Bubba Camp. Food insecurity endorsed and is agreeable to receiving grocery assistance via CF Grocery Assistance Program. [employer] also agreeable to participate in pre and post survey re: food insecurity and grocery assistance.    Verified mailing address in EPIC is accurate.    Bubba Camp provided verbal agreement to the following:  ?? To receive grocery assistance monthly via 50$ gift card.  ?? Duration of Assistance: 6 months (August 2018 thru January 2019).  ?? To return signature confirmation page each month.    Method of gift card delivery:  1. [name] Postal Service - Mailing address to be verified monthly prior to gift card being mailed.  OR  2. Provide in person at [facility] clinic appointment or during hospital admission.              Serita Grit, LCSW

## 2017-03-04 ENCOUNTER — Ambulatory Visit
Admission: RE | Admit: 2017-03-04 | Discharge: 2017-03-04 | Disposition: A | Discharge status: Home / Self Care | Payer: BC Managed Care – PPO

## 2017-03-04 MED FILL — PARI LC PLUS NEBULIZER SET//MISC: PARI LC PLUS NEBULIZER SET//MISC | 30 days supply | Qty: 1 | Fill #2

## 2017-03-04 MED FILL — NORMAL SALINE FLUSH FOR F/0.9%/SOLN: NORMAL SALINE FLUSH FOR F/0.9%/SOLN | 30 days supply | Qty: 600 | Fill #2

## 2017-03-04 MED FILL — BD IM 3mL/22G 1 INCH/SYR: BD IM 3mL/22G 1 INCH/SYR | 30 days supply | Qty: 60 | Fill #2

## 2017-03-04 MED FILL — ALCOHOL PADS//: ALCOHOL PADS// | 50 days supply | Qty: 1 | Fill #2

## 2017-03-04 MED FILL — AMIKACIN SULFATE/500/2ML/SOLN: AMIKACIN SULFATE/500/2ML/SOLN | 30 days supply | Qty: 120 | Fill #2

## 2017-03-12 NOTE — Unmapped (Signed)
RD received call from Christian Young today. He reports having green colored stools (started 5 days ago as streaks and now he reports all stools are the color of grass). Denies new foods in his diet. Denies eating/drinking foods/beverages that are blue/green in color. Reports no issues with pancreatic enzymes. Reports no constipation or abd pain, having BMs daily.     RD sent Dr. Koren Shiver EPIC message and request for him to each out to [facility] as well.

## 2017-03-13 HISTORY — PX: LOBECTOMY: SHX5089

## 2017-03-13 NOTE — Unmapped (Signed)
I saw and evaluated the patient, participating in the key portions of the service.  I reviewed the resident???s note.  I agree with the resident???s findings and plan. Keimon Basaldua H Jeffren Dombek, MD

## 2017-03-17 NOTE — Unmapped (Signed)
[facility] Pulmonary Medicine Phone Call Note    Returned call placed to hospital operator. Mr. Lack notes he has had worsening cough and sputum production c/w his prior exacerbations of CF. He feels like he needs to come in to the hospital for IV antibiotics, preferably tomorrow (can't come today). Emergency/ED precautions discussed in meantime.     Called [facility] who is arranging direct admission for tomorrow. Made his primary pulmonologist (Dr. [name]) aware.

## 2017-03-18 ENCOUNTER — Inpatient Hospital Stay
Admission: EM | Admit: 2017-03-18 | Discharge: 2017-04-06 | Disposition: A | Discharge status: Home / Self Care | Payer: BC Managed Care – PPO | Source: Intra-hospital

## 2017-03-18 LAB — CBC W/ AUTO DIFF
BASOPHILS ABSOLUTE COUNT: 0 10*9/L (ref 0.0–0.1)
EOSINOPHILS ABSOLUTE COUNT: 0.4 10*9/L (ref 0.0–0.4)
HEMATOCRIT: 42.7 % (ref 41.0–53.0)
HEMOGLOBIN: 13.9 g/dL (ref 13.5–17.5)
LYMPHOCYTES ABSOLUTE COUNT: 1.7 10*9/L (ref 1.5–5.0)
MEAN CORPUSCULAR HEMOGLOBIN CONC: 32.6 g/dL (ref 31.0–37.0)
MEAN CORPUSCULAR VOLUME: 79.8 fL — ABNORMAL LOW (ref 80.0–100.0)
MEAN PLATELET VOLUME: 7.2 fL (ref 7.0–10.0)
MONOCYTES ABSOLUTE COUNT: 0.6 10*9/L (ref 0.2–0.8)
NEUTROPHILS ABSOLUTE COUNT: 4.7 10*9/L (ref 2.0–7.5)
PLATELET COUNT: 367 10*9/L (ref 150–440)
RED BLOOD CELL COUNT: 5.35 10*12/L (ref 4.50–5.90)
RED CELL DISTRIBUTION WIDTH: 15.1 % — ABNORMAL HIGH (ref 12.0–15.0)
WBC ADJUSTED: 7.6 10*9/L (ref 4.5–11.0)

## 2017-03-18 LAB — LARGE UNSTAINED CELLS: Lab: 3

## 2017-03-18 LAB — COMPREHENSIVE METABOLIC PANEL
ALBUMIN: 4.7 g/dL (ref 3.5–5.0)
ALKALINE PHOSPHATASE: 78 U/L (ref 38–126)
ALT (SGPT): 116 U/L — ABNORMAL HIGH (ref 19–72)
ANION GAP: 13 mmol/L (ref 9–15)
AST (SGOT): 72 U/L — ABNORMAL HIGH (ref 19–55)
BILIRUBIN TOTAL: 0.6 mg/dL (ref 0.0–1.2)
BUN / CREAT RATIO: 18
CALCIUM: 9.6 mg/dL (ref 8.5–10.2)
CHLORIDE: 102 mmol/L (ref 98–107)
CO2: 24 mmol/L (ref 22.0–30.0)
CREATININE: 0.96 mg/dL (ref 0.70–1.30)
EGFR MDRD AF AMER: >=60 mL/min/{1.73_m2} (ref >=60)
EGFR MDRD NON AF AMER: >=60 mL/min/{1.73_m2} (ref >=60)
POTASSIUM: 4.4 mmol/L (ref 3.5–5.0)
PROTEIN TOTAL: 7.9 g/dL (ref 6.5–8.3)
SODIUM: 139 mmol/L (ref 135–145)

## 2017-03-18 LAB — LACTATE BLOOD VENOUS: Lactate:SCnc:Pt:BldV:Qn:: 2.3 — ABNORMAL HIGH

## 2017-03-18 LAB — LIPASE: Triacylglycerol lipase:CCnc:Pt:Ser/Plas:Qn:: 16 — ABNORMAL LOW

## 2017-03-18 LAB — POTASSIUM: Potassium:SCnc:Pt:Ser/Plas:Qn:: 4.4

## 2017-03-18 MED ORDER — LIPASE-PROTEASE-AMYLASE 24,000-76,000-120,000 UNIT CAPSULE,DELAYED REL
ORAL_CAPSULE | ORAL | 0 refills | 0 days | Status: CP
Start: 2017-03-18 — End: 2017-05-15

## 2017-03-18 NOTE — Unmapped (Signed)
He reports symptoms of a pulmonary exacerbation.    Date of symptom onset: seen by PCP for sinus infection and ear infection last Monday. Prescribed Augmentin for a week. Symptoms have not improved. Feels like everything has moved to his chest.   [place], feels like he is getting worse everyday.     Symptoms include:   Pulmonary-    Increased cough yes   Productive cough yes, dark green in color    Hemoptysis  yes, normal little streaks when he has an exacerbation    Chest pain  yes both sides of chest    Chest tightness yes, gets out of breath really easily    Fever  unknown, he thinks so   Chills  no, periods of being really hot and cold, tested for flu at the PCP, negative    Nightsweats yes   Change in appetite yes   Other:  very unstable, feel lighthead    Current measures they are taken:   Able to perform regular AC & breathing treatments: yes   Increased airway clearance to three times a day once he started to feel sick.    Requiring additional: yes   Currently on inhaled abx: yes, amikacin     Planning to head to the ED rather than coming to an offered clinic appointment. His wife is off today and he has transportation. Discussed with Dr. Koren Shiver, agrees with this plan.

## 2017-03-19 LAB — COMPREHENSIVE METABOLIC PANEL
ALBUMIN: 3.8 g/dL (ref 3.5–5.0)
ALKALINE PHOSPHATASE: 68 U/L (ref 38–126)
ALT (SGPT): 104 U/L — ABNORMAL HIGH (ref 19–72)
ANION GAP: 11 mmol/L (ref 9–15)
AST (SGOT): 55 U/L (ref 19–55)
BILIRUBIN TOTAL: 0.5 mg/dL (ref 0.0–1.2)
BLOOD UREA NITROGEN: 16 mg/dL (ref 7–21)
BUN / CREAT RATIO: 18
CALCIUM: 8.7 mg/dL (ref 8.5–10.2)
CHLORIDE: 98 mmol/L (ref 98–107)
CO2: 27 mmol/L (ref 22.0–30.0)
CREATININE: 0.87 mg/dL (ref 0.70–1.30)
EGFR MDRD AF AMER: >=60 mL/min/{1.73_m2} (ref >=60)
GLUCOSE RANDOM: 109 mg/dL (ref 65–179)
POTASSIUM: 4 mmol/L (ref 3.5–5.0)
PROTEIN TOTAL: 6.6 g/dL (ref 6.5–8.3)
SODIUM: 136 mmol/L (ref 135–145)

## 2017-03-19 LAB — ALBUMIN: Albumin:MCnc:Pt:Ser/Plas:Qn:: 3.8

## 2017-03-19 LAB — CBC
HEMOGLOBIN: 12.4 g/dL — ABNORMAL LOW (ref 13.5–17.5)
MEAN CORPUSCULAR HEMOGLOBIN CONC: 33 g/dL (ref 31.0–37.0)
MEAN CORPUSCULAR HEMOGLOBIN: 26.6 pg (ref 26.0–34.0)
MEAN CORPUSCULAR VOLUME: 80.5 fL (ref 80.0–100.0)
MEAN PLATELET VOLUME: 7.6 fL (ref 7.0–10.0)
PLATELET COUNT: 325 10*9/L (ref 150–440)
RED BLOOD CELL COUNT: 4.69 10*12/L (ref 4.50–5.90)
RED CELL DISTRIBUTION WIDTH: 15.1 % — ABNORMAL HIGH (ref 12.0–15.0)
WBC ADJUSTED: 7.4 10*9/L (ref 4.5–11.0)

## 2017-03-19 LAB — MAGNESIUM: Magnesium:MCnc:Pt:Ser/Plas:Qn:: 1.7

## 2017-03-19 LAB — LACTATE BLOOD VENOUS: Lactate:SCnc:Pt:BldV:Qn:: 1.5

## 2017-03-19 LAB — D-DIMER QUANTITATIVE (CH,ML,PD,ET): Lab: 167

## 2017-03-19 LAB — RED CELL DISTRIBUTION WIDTH: Lab: 15.1 — ABNORMAL HIGH

## 2017-03-19 NOTE — Unmapped (Signed)
Pulmonologist advised pt to come in for worsening CF exacerbation. Cough productive of green phlegm, chest tightness, earache, DOE

## 2017-03-19 NOTE — Unmapped (Addendum)
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 Hospital Course is outlined below by problem list:    Outpatient Follow-up Items  1. Please attend pulmonary rehab  2. Please see audiology for ototoxicity evaluation in light of amikacin and tobramycin use  3. Please follow-up with [facility] for staple removal 2 weeks after procedure.     CF with Bronchiectasis Exacerbation:??Diagnosed at age 37. Most recent PFTs 02/21/17 showed FEV1 74% which was at baseline if not better than previous. Sputum culture from 03/18/17??with smooth Pseudumonas (S: tobra, Levo/Cipro; I: Aztreonam, Zosyn; R: amikacin, cefepime, ceftazidime, imipenem/meropenem). Of note he is also being treated for macrolide-sensitive M abscessus with azithro, clofazimine and inhaled amikacin, tentative end date of 11/2017 per his primary pulmonologist, Dr. Koren Young', note. He was started on ceftazidime, and amikacin on 8/7-8/20. Subsequent sputum culture from 8/17 was positive for stenotrophomonas maltophilia (R: Ceftazidime, S: Levofloxacin, Minocycline, and Trimethoprim + Sulfamethoxazole). He became febrile on 8/19 after his surgical procedure as below.  IV zosyn and inhaled tobramycin were added for further pseudomonal coverage and ceftazidime was discontinued. Minocycline was added for stenotrophomonas. Plan to continue IV Zosyn, IV amikacin, oral minocycline, and inhaled Tobramycin until 8/31. Then transition to inhaled amikacin. He is interested in pulmonary rehab program after discharge. He also needs audiometry testing.    Right Upper Lobectomy: As Christian Young has had ~8 admissions for exacerbations requiring IV antibiotics the past year. His CT demonstrated significant scarring and volume loss in RUL, and perfusion scan demonstrates decreased perfusion to this lobe. Given concern his RUL was likely contributing to recurrent infections without contributing to ventilation/perfusion, he underwent RUL lobectomy 8/17. He subsequently had a small Right apical pneumothorax for which a chest tube was placed. On 8/19 he became febrile. Given 8/17 culture results he was started on zosyn and inhaled tobramycin, along  With continuing M abscessus meds and adding minocycline for stenotrophomonas.  The chest tube was removed on 8/21 after resolution of pneumothorax. He required an epidural and PCA for post op pain control. He was noted to have an elevated right hemi-diaphragm on XR with concern for pleural effusion on 08/22. U/S by Interventional Pulmonology revealed a pleural effusion too small to drain and a reduced mobility of the right hemidiaphragm. He was successfully transitioned to PO pain medicines on 8/23. He will require staple removal from his incision 2 weeks post op.      History of DVTs:??Per history may be both provoked and unprovoked, on chronic Xarelto 20mg  at home. Had not taken Xarelto since approximately 1 week prior to admission 2/2 inability to fill prescription. Held xarelto on admission in setting of hemoptysis. Gave Heparin SQ at DVT ppx dosing to provide some anticoagulation. Per surgery his home Xarelto was resumed 8/23.   ??  Acute on Chronic Sinusitis: Prescribed Augmentin on 8/2 by PCP. CT sinus in May 2018 showed sequelae of previous sinus surgery and bilateral mucosal thickening. We continued home flonase, zyrtec, singulair.   ??  Pancreatic insufficiency 2/2 CF:??Wt 253lb (baseline 255-265) on admission. We continued home Creon 7 caps with meals, 5 with snacks (home Creaon which has 36,000 lipase per pill, re-ordered this for meals if he has, but could not re-order with snacks so ordered the 24,000 lipase pill for snacks, will need to convert meal time to appropriate 11 caps with meals).  His Pancreatic elastase was <31mcg/g  On discharge he will  resume home Creon 36,000 after discharge. When discharged home resume usual Complete Formulation Vit D5000 softgels, 2 daily. Provides total of 10,000 International units vitamin D daily  ??  HTN:??Patient was hypertensive at 140/99 on admission. We continued his home Lisinopril 10mg  po daily and Metoprolol succinate 25mg  po daily. On 8/14, it was noted his BP was ranging from 140-150s/60-90s. We changed metoprolol for coreg 3.125mg  po BID.   ??  Restless legs: We continued home mirapex 0.25 mg BID and home Lyrica 100 mg by mouth twice daily.    Depression and history of SI:??Doing well, denies SI at this time. We continued his home lamictal.

## 2017-03-19 NOTE — Unmapped (Signed)
Cystic Fibrosis Nutrition Assessment    Inpatient: MD Consult this admission and related follow up  Primary Pulmonologist: Dr. Koren Shiver  ===================================================================  Christian Young is a 31 y.o. male seen for medical nutrition therapy. Currently admitted with CF exacerbation for IV antibiotics.  Christian Young reports decreased appetite at home, eating 1-2 meals daily. He feels this is related to current exacerbation. Continues to endorse green colored stools. Previously reviewed diet with [name], not eating/drinking blue/green colored foods/beverages. Continues to feel Creon is working well, did not bring Creon 36,000 with him this admit and is not on inpatient drug formulary. Agreeable to use Creon 24,000 during admission.  ===================================================================  INTERVENTION  1. Monitor appetite/intake.     2. During admission continue Creon 24,000 (11 at meals, 8 at snacks).  - Can resume home Creon 36,000 after discharge.    3. May consider ordering fecal elastase to confirm PI status.  - Informed MDG of ongoing green colored stools.    4. Recommend start CF vitamin regimen:  MVW Complete Formulation gel cap 2 daily and 6000 International units vitamin D3 daily.  - When discharged home resume usual Complete Formulation D5000 softgels, 2 daily.  - Defer vitamin K supplementation to MD team given he is on eliquis.   - Considering elevated PT level on previous admissions - recommend check des-gama-carboxy-PT (DCP) as this is a more sensitive and specific marker for vitamin K than prothrombin time (PT).   - Please check vitamin D level this admit.    5. Monitor blood glucose levels.    6. Weigh patients twice weekly this admit.    7. Continue remainder of nutrition regimen:  - High Calorie High Protein diet  - acid reducer  - bowel regimen    Inpatient:   Will follow up with patient per protocol: 1-2 times per week (and more frequent as indicated)===================================================================  ASSESSMENT:  Cystic Fibrosis Nutrition Category = Outstanding    Current diet is appropriate for CF. Current PO intake is not adequate to meet estimated CF needs. Patient not meeting goal for CF weight management. Enzyme dose is within established guidelines. Vitamin prescription is not appropriate to reach/maintain optimal fat soluble vitamin levels. Sodium needs for CF met with PO and/or supplement. Bowel regimen is appropriate. Acid reducer appropriate for GERD and enzyme activation. Patient may benefit from checking DCP given elevated PT on prevoius admit and noted on eliquis. To benefit from checking vitamin D level to ensure proper supplementation..    Malnutrition Assessment using AND/ASPEN Clinical Characteristics:  Patient does not meet AND/ASPEN criteria for malnutrition at this time (03/19/17 1404)    Goals:  1. Meet estimated daily needs: 1610-9604 kcals (24-28 kcals/kg/day); 146-183 gm pro (1.2-1.5 gm pro/kg/day)  2. Reach/maintain established goals for CF:                Adult - BMI 22kg/m2 for CF females and 23kg/m2 for CF males  3. Normal fat-soluble vitamin levels: Vitamin A, Vitamin E and PT per lab range; Vitamin D 25OH total >30  4. Maintain glucose control. Carbohydrate content of diet should comprise 40-50% of total calorie needs, but carbohydrates are not restricted in this population.    5.  Meet sodium needs for CF  ===================================================================  INPATIENT:    Current Nutrition Orders (inpatient):       Nutrition Orders            Start     Ordered    03/19/17 0900  Supplement High  Fat Milkshake; # of Products PER Serving: 1 2xd PC  2 times a day (PC)     Question Answer Comment   Select Supplement High Fat Milkshake    # of Products PER Serving 1        03/18/17 2125    03/18/17 2126  Nutrition Therapy High Calorie High Protein  Effective now     Question:  Nutrition Therapy (T):  Answer:  High Calorie High Protein    03/18/17 2125        CF Nutrition related medications (inpatient): Nutritionally relevant medications reviewed.   Omeprazole  Creon 24,000 (11 at meals, 8 at snacks) provides 2200 units lipase/kg/meal & 16109 units lipase/kg/day  Miralax BID  Symdeko  ondansetron PRN    CF Nutrition related labs (inpatient):  03-19-17: Glu 109, Glu-POC 166    Last 5 Recorded Weights    03/18/17 2217   Weight: (!) 120.1 kg (264 lb 12.4 oz)   ==================================================================  CLINICAL DATA:  Past Medical History:   Diagnosis Date   ??? Anxiety    ??? Chronic pain disorder    ??? Cystic fibrosis (CMS-HCC)    ??? Depression    ??? Hypertension    Per review of Care Everywhere PMHx also includes: CFRD, PI, essential HTN    Anthroprometric Evaluation:  Weight changes: Weight up.  Per review of Care Everywhere: March 2018 weight 262 pounds  BMI Readings from Last 1 Encounters:   03/18/17 35.91 kg/m??     Wt Readings from Last 3 Encounters:   03/18/17 (!) 120.1 kg (264 lb 12.4 oz)   02/21/17 (!) 115 kg (253 lb 9.6 oz)   02/15/17 (!) 118.8 kg (261 lb 14.5 oz)     Ht Readings from Last 3 Encounters:   03/18/17 182.9 cm (6')   02/05/17 180.3 cm (5' 11)   01/10/17 180.3 cm (5' 11)   ==================================================================  Energy Intake (outpatient):  Diet: High in calories, fat, salt. Diet evaluated this visit. Reports appetite is down with current illness, eating 1-2 meals daily PTA.  Food allergies: Per review of Care Everywhere note banana is listed as allergy.  Diet and CFTR modulators: Prescribed Symdeko (tezacaftor/ivacaftor).    PO Supplements: none  Appetite Stimulant: none  Enteral feeding tube: n/a  Sodium in diet: Adequate from diet  Calcium in diet:  Adequate from diet    Fat Malabsorption (outpatient):  Enzyme brand, (meals/snacks):   Creon 36,000 (7 at meals, 5 at snacks)   - Switched from Zenpep to Creon due to report of bloating & greasy stools when on Zenpep.   Enzyme administration details: correct pre-meal administration., moderate compliance, swallows capsules whole  Enzyme dose per MEAL (units lipase/kg/meal) 2100  Enzyme dose per DAY (units lipase/kg/day) 10800  Stools: 1-2 daily, denies oily/greasy appearance. Reports green colored stools continue, reported to RD 03-12-17 as outpatient.  Abdominal pain: None reported  Fecal Fat Studies:  No results found for: ELAST  GI meds: Nutritionally relevant medications reviewed. MVW probiotic, omeprazole.    Vitamins/Minerals (outpatient):  CF-specific MVI, dose, compliance: MVW Complete Formulation Softgel D-5000 2 daily  Other vitamins/minerals/herbals: none  Calcium supplement: none  Patient Resources: -[employer] (phone [phone] [email or web address])   - Orders CF vitamin and probiotic directly from manufacturer (MVW), cost billed directly to [employer] grant  - He is thinks he may also be enrolled in [employer] but he is unsure at this time.  Fat-soluble vitamin levels:  Lab Results   Component Value Date/Time  VITAMINA 44.4 11/19/2016 0647           Lab Results   Component Value Date/Time    PT 14.1 (H) 11/29/2016 0807    PT 15.3 (H) 11/26/2016 0529    PT 16.1 (H) 11/23/2016 0402    PT 18.1 (H) 11/20/2016 0530    PT 20.5 (H) 11/19/2016 0647     Bone Health: unknown  CF Related Diabetes: Yes, Per Care EveryWhere has been on 5 units levemir at bedtime in the past. Also noted per Care Everywhere he was supposed to have had Endo appointment at outside facility in March 2018 - unclear if he made it to this appointment.        Lab Results   Component Value Date/Time    A1C 6.1 (H) 11/19/2016 1308

## 2017-03-19 NOTE — Unmapped (Signed)
Patient rounds completed. The following patient needs were addressed:  Personal Belongings, Plan of Care, Call Bell in Reach and Bed Position Low .IV antibiotics are infusing. Pt has no unmet needs. Will continue to monitor.

## 2017-03-19 NOTE — Unmapped (Signed)
Patient rounds completed. The following patient needs were addressed:  Personal Belongings, Call Bell in Reach and Bed Position Low .Pt returned from xray. Physician is at the bedside speaking with patient. Plan to obtain labs and place patient on monitor.

## 2017-03-19 NOTE — Unmapped (Signed)
Report given to [facility], RN. Care transferred at this time.

## 2017-03-19 NOTE — Unmapped (Signed)
General Medicine History and Physical    Assessment/Plan:    Principal Problem:    Cystic fibrosis with pulmonary exacerbation (CMS-HCC)  Active Problems:    Essential hypertension    History of DVT (deep vein thrombosis)    Chronic pansinusitis    Pancreatic insufficiency      Christian Young is a 31 y.o. y/o male with PMHx of CF with pancreatic insufficiency, CF sinus disease, HTN, depression, history of DVTs on chronic anticoagulation who presents to [facility] with Cystic fibrosis with pulmonary exacerbation (CMS-HCC).    CF with Bronchiectasis Exacerbation: Diagnosed at age 9 following collapsed lung and psedomonas infection. [phone] mutation. Most recent PFTs 02/21/17 showed FEV1 74% which was at baseline if not better than previous. CXR on presentation generally unchanged without focal infiltrates.  Wife reports temp to 99 at home, nothing greater without sweats/chills/rigors. Does report a few days of hemoptysis, streaking without clots which has happened during past exacerbations. Does have history of M. Abscessus, PsA, Steno and MRSA (MRSA by report but no evidence in chart, only MSSA from [facility] that I could find). Primary pulmonologist is Dr. Koren Shiver, establishing care at [facility].   - Cultures   - M abscessus 7/12, Smooth PsA 4/9 (Cipro, Levo, Tobra S) 12/17, Steno at [facility]   - CF sputum culture ordered and sent   - AFB culture ordered   - f/u BCx obtained in ED 8/6  - Abx   - Ceftax, Tobra (8/6-present), pharm consulted for dosing   - Home Azithro 500 daily, Clofazamine  - HTS nebs 7%  - Albuterol q6  - Airway clearance (home chest vest)  - Hold Pulmozyme given hemoptysis at this time  - Teza/iva continued  - Has R sided port in place, functional   - Holding home inhaled Amikacin     SOB, Tachycardia: Given he is off his anticoagulation and has had DVTs in past, SOB could be 2/2 PE, although this does seem less likely given sputum production and presentation more c/w CF exacerbation, however Wells score is 4 (hemoptysis, previous DVT, tachycardia). May have a larger q-wave in lead III and possible flattening of q waves in III, but no change in S1 or p-wave in lead II. No exam findings c/w DVT and much more likely explanation so will hold off on imaging or empiric treatment at this time.  - D-dimer, if elevated will re-evaluate imaging  - SQ heparin at this time for PPx dose only, if d-dimer elevated will reconsider    Acute on Chronic Sinusitis: Prescribed Augmentin on 8/2 by PCP. CT inus in May 2018 showed sequelae of previous sinus surgery and bilateral mucosal thickening.  - Home flonase, zyrtec, singulair   - bicarb/sodium chloride (700/2300mg ) irrigation unable to order, please talk to pharm in AM    Elevated Transaminases: AST/ALT 72/116 most recent check 01/2017 wnl. Very mild elevation makes viral hep less likely and no risk factors. Is on Teza/ivacaftor which can contribute. CF liver disease also possible but has not had issues with this in the past  - Trend LFTs    Pancreatic insufficiency 2/2 CF: Wt 253lb (baseline 255-265)  - Home Creon 7 caps with meals, 5 with snacks (home Creaon which has 36,000 lipase per pill, re-ordered this for meals if he has, but could not re-order with snacks so ordered the 24,000 lipase pill for snacks, will need to convert meal time to appropriate 11 caps with meals)  - Dietician c/s    Constipation: No  known DIOS, but does take Miralax chronically  - Home Miralax BID    History of DVTs: Per history may be both provoked and unprovoked, on chronic Xarelto 20mg  at home. Has not taken Xarelto since mid last week 2/2 inability to fill prescription  - Hold xarelto in setting of hemoptysis for now, will reassess for ongoing hemoptysis  - Will do Heparin SQ at DVT ppx dosing for now to provide some anticoagulation    HTN: 140/99 on presentation   - Home Lisinopril, Metoprolol   - PRN hydralazine    Restless legs:  - Continue home mirapex 0.25 mg BID  - Continue home Lyrica 100 mg by mouth twice daily    Depression and history of SI: Doing well, denies SI at this time  - Continue home lamictal and vybrid     Code Status:  Full Code  ___________________________________________________________________    Chief Complaint  Chief Complaint   Patient presents with   ??? Cough       HPI:  Christian Young is a 31 y.o. y/o male with PMHx as noted below that presents to [facility] with Cystic fibrosis with pulmonary exacerbation (CMS-HCC).    He notes that he first started having increased shortness of breath both at rest as well as dyspnea on exertion starting last Thursday with an increase in productivity of his cough. He notes that he does have chest pain when he coughs which has progressed to be pleuritic in nature with deep inspiration over the past few days.  Is cough has become more consistent and productive of light to dark green sputum, which is consistent with previous exacerbations, however this is worse than typical for him.  He also started noticing nasal congestion and maxillary sinus pressure around the same time, going to his primary care doctor and receiving a prescription for Augmentin on August 2. He home azithromycin and inhaled amikacin without missing any doses. He does note a mild elevation in his temperature to 99-99.5 but no recorded fevers at home greater than 38.0. He denies any chills sweats or rigors.  He does endorse small volume hemoptysis within the productive sputum for the past 3-4 days. No frank hemoptysis or clots. He has had a few episodes of posttussive emesis, also consistent with previous exacerbations, but otherwise no nausea, vomiting, abdominal pain,diarrhea or constipation at this time. E does take MiraLAx regularly 1-2 times per day and although has never had a DIOS in the past has struggled with regular constipation. Reports that he consistently uses L albuterol nebulizer 2-3 times per day and has used this more frequently since the exacerbation started on Thursday.     Of note he does report that he was unable to fill his prescription of Xarelto and has not taken it for close to a week at this point. He does not endorse any other difficulty obtaining medications.    His CF history is significant for diagnosis at age 11 following collapsed lung ad a Pseudomonas ball discovered on exploration at [facility]. He recently transferred his care to [facility] [place] last year. n review of microbiology, he has grown Mycobacterium abscesses,smooth Pseudomonas and stenotrophomonas in the past.He reports have a history of MRSA, however review records only shows oxacillin sensitive staph.    Takes Creon at home, 36,000 units of lipase per pill, 7 pills with meals and 5 with snacks.    Allergies:  Cayston [aztreonam lysine]    Medications:   Prior to Admission medications  Medication Sig Start Date End Date Taking? Authorizing Provider   albuterol (PROVENTIL HFA;VENTOLIN HFA) 90 mcg/actuation inhaler Inhale 2 puffs every six (6) hours as needed.    Historical Provider, MD   albuterol 2.5 mg /3 mL (0.083 %) nebulizer solution Inhale 2.5 mg by nebulization Two (2) times a day.     Historical Provider, MD   alcohol swabs PadM Use as directed with inhaled antibiotics 12/24/16   Donzetta Kohut, CPP   amikacin (AMIKIN) 500 mg/2 mL injection Withdraw 2 mL (500mg ) and add to neb cup. Administer using nebulizer BID. 12/24/16   Donzetta Kohut, CPP   azithromycin (ZITHROMAX) 500 MG tablet Take 500 mg by mouth daily.    Historical Provider, MD   budesonide-formoterol (SYMBICORT) 160-4.5 mcg/actuation inhaler Inhale 2 puffs Two (2) times a day.    Historical Provider, MD   cetirizine (ZYRTEC) 10 MG tablet Take 10 mg by mouth daily.    Historical Provider, MD   CLOFAZIMINE ORAL Take 100 mg by mouth daily.    Historical Provider, MD   dornase alfa (PULMOZYME) 1 mg/mL nebulizer solution Inhale 2.5 mg daily.     Historical Provider, MD   fluticasone (FLONASE) 50 mcg/actuation nasal spray 2 sprays by Each Nare route daily. 01/11/17 01/11/18  Maxwell Caul Chilcutt, MD   lamoTRIgine (LAMICTAL) 25 MG tablet Take 25 mg by mouth daily. Then increase to BID after 1 week.    Historical Provider, MD   lipase-protease-amylase (CREON) 36,000-114,000- 180,000 unit CpDR Take 7 capsules with meals and 5 capsules with snacks 02/21/17   [name], MD   lisinopril (PRINIVIL,ZESTRIL) 10 MG tablet Take 10 mg by mouth daily.     Historical Provider, MD   melatonin 3 mg Tab Take 2 tablets (6 mg total) by mouth nightly. 01/14/17   Reginold Agent, MD   metoprolol succinate (TOPROL-XL) 25 MG 24 hr tablet Take 25 mg by mouth daily.     Historical Provider, MD   montelukast (SINGULAIR) 10 mg tablet Take 10 mg by mouth nightly.    Historical Provider, MD   NON FORMULARY MVW Probiotic (specifically for patients with cystic fibrosis) - 1 capsule daily    Historical Provider, MD   NON FORMULARY MVW Complete Formulation D500 Multivitamin (specific for patients with cystic fibrosis) 1 capsule twice daily    Historical Provider, MD   omeprazole (PRILOSEC) 20 MG capsule Take 20 mg by mouth daily.    Historical Provider, MD   PARI LC D NEBULIZER Misc Provide 1 device  for use with inhaled medication. 12/24/16   Donzetta Kohut, CPP   pramipexole (MIRAPEX) 0.125 MG tablet Take 0.25 mg by mouth daily.     Historical Provider, MD   pregabalin (LYRICA) 100 MG capsule Take 200 mg by mouth Two (2) times a day.     Historical Provider, MD   rivaroxaban (XARELTO) 20 mg tablet Take 20 mg by mouth daily with evening meal.    Historical Provider, MD   sodium bicarb-sodium chloride 700-2,300 mg Pack Irrigate each nostril with 120 mL of solution (1 packet mixed with 240 mL of distilled water) BID    Historical Provider, MD   sodium chloride (NS) Soln Add 1mL NaCl into neb cup along with amikacin. 12/24/16   Donzetta Kohut, CPP   sodium chloride 7% 7 % Nebu Inhale 4 mL by nebulization Two (2) times a day.    Historical Provider, MD   syringe with needle (SYRINGE 3CC/20GX1) 3 mL  20 gauge x 1 Syrg For use with inhaled antibiotic 12/24/16   Donzetta Kohut, CPP   tezacaftor 100mg /ivacaftor 150mg  and ivacaftor 150mg  (SYMDEKO) tablets Take 1 tablet (tezacaftor 100mg /ivacaftor 150mg ) by mouth every AM and 1 tablet (ivacaftor 150mg ) every PM with fatty food 12/24/16   Donzetta Kohut, CPP   traZODone (DESYREL) 100 MG tablet Take 1.5 tablets (150 mg total) by mouth nightly. 02/15/17 03/17/17  De-Vaughn Sima Matas, MD   vilazodone HCl (VIIBRYD ORAL) Take 20 mg by mouth daily. Titrating 10mg  daily x 1 week.    Historical Provider, MD       Medical History:  Past Medical History:   Diagnosis Date   ??? Anxiety    ??? Chronic pain disorder    ??? Cystic fibrosis (CMS-HCC)    ??? Depression    ??? Hypertension        Surgical History:  No past surgical history on file.    Social History:  Tobacco use:   reports that he has never smoked. He has never used smokeless tobacco.  Alcohol use:   reports that he does not drink alcohol.  Drug use:  reports that he does not use drugs.  Living situation: the patient lives with their spouse.    Family History:  Family History   Problem Relation Age of Onset   ??? Bipolar disorder Mother    ??? Depression Mother        Review of Systems:  10 systems reviewed and are negative unless otherwise mentioned in HPI      Physical Exam:  Temp:  [36.6 ??C-36.9 ??C] 36.6 ??C  Heart Rate:  [93-111] 93  SpO2 Pulse:  [111] 111  Resp:  [16-22] 16  BP: (137-147)/(88-99) 140/99  SpO2:  [93 %-98 %] 98 %    Wt Readings from Last 6 Encounters:   02/21/17 (!) 115 kg (253 lb 9.6 oz)   02/15/17 (!) 118.8 kg (261 lb 14.5 oz)   01/14/17 (!) 117.2 kg (258 lb 6.1 oz)   01/08/17 (!) 117.1 kg (258 lb 2.5 oz)   12/24/16 (!) 117.9 kg (260 lb)   11/30/16 (!) 119.4 kg (263 lb 4.8 oz)       General Appearrance: Alert, NAD, appears well nourished   HEENT: EOMI, conjunctiva clear, no icterus, nares patent without rhinorrhea   Heart: S1, S2, no murmurs rubs or gallops. Normal precordial activity. No JVD bilaterally.   Lungs: Normal WOB on RA. Good air movement bilaterally. Mild bronchial breath sounds diffusely bilaterally. No crackles or focal changes.    Abd: soft, NT, ND, no HSM, normal BS  Extremities: No pitting edema.   Psych: No agitation, anxiety, or depression.   Skin: No abnormal lesions seen      Test Results:  Data Review:    All lab results last 24 hours:    Recent Results (from the past 24 hour(s))   ECG 12 lead (Adult)    Collection Time: 03/18/17  5:12 PM   Result Value Ref Range    EKG Systolic BP  mmHg    EKG Diastolic BP  mmHg    EKG Ventricular Rate 111 BPM    EKG Atrial Rate 111 BPM    EKG P-R Interval 144 ms    EKG QRS Duration 82 ms    EKG Q-T Interval 330 ms    EKG QTC Calculation 448 ms    EKG Calculated P Axis 41 degrees    EKG Calculated R Axis 40  degrees    EKG Calculated T Axis 41 degrees   Lactate, Venous, Whole Blood    Collection Time: 03/18/17  6:11 PM   Result Value Ref Range    Lactate, Venous 2.3 (H) 0.5 - 1.8 mmol/L   Comprehensive Metabolic Panel    Collection Time: 03/18/17  6:11 PM   Result Value Ref Range    Sodium 139 135 - 145 mmol/L    Potassium 4.4 3.5 - 5.0 mmol/L    Chloride 102 98 - 107 mmol/L    CO2 24.0 22.0 - 30.0 mmol/L    BUN 17 7 - 21 mg/dL    Creatinine 1.61 0.96 - 1.30 mg/dL    BUN/Creatinine Ratio 18     EGFR MDRD Non Af Amer >=60 >=60 mL/min/1.59m2    EGFR MDRD Af Amer >=60 >=60 mL/min/1.39m2    Anion Gap 13 9 - 15 mmol/L    Glucose 146 65 - 179 mg/dL    Calcium 9.6 8.5 - 04.5 mg/dL    Albumin 4.7 3.5 - 5.0 g/dL    Total Protein 7.9 6.5 - 8.3 g/dL    Total Bilirubin 0.6 0.0 - 1.2 mg/dL    AST 72 (H) 19 - 55 U/L    ALT 116 (H) 19 - 72 U/L    Alkaline Phosphatase 78 38 - 126 U/L   CBC w/ Differential    Collection Time: 03/18/17  6:11 PM   Result Value Ref Range    WBC 7.6 4.5 - 11.0 10*9/L    RBC 5.35 4.50 - 5.90 10*12/L    HGB 13.9 13.5 - 17.5 g/dL    HCT 40.9 81.1 - 91.4 %    MCV 79.8 (L) 80.0 - 100.0 fL    MCH 26.0 26.0 - 34.0 pg    MCHC 32.6 31.0 - 37.0 g/dL    RDW 78.2 (H) 95.6 - 15.0 %    MPV 7.2 7.0 - 10.0 fL    Platelet 367 150 - 440 10*9/L    Absolute Neutrophils 4.7 2.0 - 7.5 10*9/L    Absolute Lymphocytes 1.7 1.5 - 5.0 10*9/L    Absolute Monocytes 0.6 0.2 - 0.8 10*9/L    Absolute Eosinophils 0.4 0.0 - 0.4 10*9/L    Absolute Basophils 0.0 0.0 - 0.1 10*9/L    Large Unstained Cells 3 0 - 4 %    Microcytosis Slight (A) Not Present       Imaging: Radiology studies were personally reviewed    EKG: inus tachy otherwise normal EKG, unchanged from previous tracings.

## 2017-03-19 NOTE — Unmapped (Addendum)
Aminoglycoside Initiation Pharmacy Note    Christian Young is a 31 y.o. male being initiated on tobramycin for CF exacerbation.    Goal peak: 16- 18 mg/L (2/2 ototoxicity)   Goal trough: <1 with a 6-8 hour window of undetectable levels    Pharmacokinetic Parameters:    Wt Readings from Last 1 Encounters:   02/21/17 (!) 115 kg (253 lb 9.6 oz)       Lab Results   Component Value Date    CREATININE 0.67 (L) 02/14/2017       Recommended Dose: 500 mg IV q24 based off previous regimen in May 2018 (of note: renal function has improved)     Pharmacy will continue to monitor and order levels as appropriate.  Patient will be followed for changes in renal function, toxicity, and efficacy.  Please page service pharmacist with questions/clarifications.    [name] Hermelinda Medicus,  PharmD

## 2017-03-19 NOTE — Unmapped (Signed)
Medicine Daily Progress Note    Assessment/Plan:    Principal Problem:    Cystic fibrosis with pulmonary exacerbation (CMS-HCC)  Active Problems:    Essential hypertension    History of DVT (deep vein thrombosis)    Chronic pansinusitis    Pancreatic insufficiency  Resolved Problems:    * No resolved hospital problems. *    Christian Young is a 31 y.o. y/o male with PMHx of CF with pancreatic insufficiency, CF sinus disease, HTN, depression, history of DVTs on chronic anticoagulation who presents to [facility] with Cystic fibrosis with pulmonary exacerbation (CMS-HCC).    CF with Bronchiectasis Exacerbation: Diagnosed at age 85 following collapsed lung and psedomonas infection. [phone] mutation. Most recent PFTs 02/21/17 showed FEV1 74% which was at baseline if not better than previous. CXR on presentation generally unchanged without focal infiltrates. Does report a few days of hemoptysis, streaking without clots which has happened during past exacerbations. Does have history of M. Abscessus, PsA, Steno and MRSA (by self report only). Primary pulmonologist is Dr. Koren Shiver, establishing care at [facility].   - Cultures              - M abscessus 7/12, Smooth PsA 4/9 (Cipro, Levo, Tobra S) 12/17, Steno at [facility]              - CF sputum culture ordered and sent              - AFB culture ordered              - f/u BCx obtained in ED 8/6  - Abx              - Ceftax (8/6 - ?), s/p Tobra (8/6-8/7)   - Amikacin (8/7 - ?)              - Home Azithro 500 daily, Clofazamine  - HTS nebs 7%, Albuterol q6, Teza/iva continued  - Airway clearance (home chest vest)  - Hold Pulmozyme given hemoptysis at this time  - Holding home inhaled Amikacin    - Chest CT w.o contrast: Pending  ??  Acute on Chronic Sinusitis: Prescribed Augmentin on 8/2 by PCP. CT inus in May 2018 showed sequelae of previous sinus surgery and bilateral mucosal thickening.  - Home flonase, zyrtec, singulair   - bicarb/sodium chloride (700/2300mg ) irrigation. Pt may bring from home- nonformulary     Pancreatic insufficiency 2/2 CF: Wt 253lb (baseline 255-265)  - Home Creon 7 caps with meals, 5 with snacks (home Creaon which has 36,000 lipase per pill, re-ordered this for meals if he has, but could not re-order with snacks so ordered the 24,000 lipase pill for snacks, will need to convert meal time to appropriate 11 caps with meals)  - Dietician c/s [ ]   ??  History of DVTs: Per history may be both provoked and unprovoked, on chronic Xarelto 20mg  at home. Has not taken Xarelto since mid last week 2/2 inability to fill prescription  - Hold xarelto in setting of hemoptysis for now, will reassess for ongoing hemoptysis  - Will do Heparin SQ at DVT ppx dosing for now to provide some anticoagulation  ??  HTN: 140/99 on presentation   - Home Lisinopril, Metoprolol   - PRN hydralazine  ??  Restless legs:  - Continue home mirapex 0.25 mg BID  - Continue home Lyrica 100 mg by mouth twice daily  Depression and history of SI: Doing well, denies SI at this time  -  Continue home lamictal and vybrid   ??  Code Status:  Full Code    ___________________________________________________________________    Subjective:  No acute events overnight,  Pt was seen at bedside this morning. he claims he is still having sputum production of green nature. he claims that he has not had any new hemoptysis of note. He said he still feels very poorly. he explains his history of >5 thronbosis/ DVT situations all assocaited with catheters and PICCS. He claims that he has never had a DVT of the lower extremities he knows of.     Labs/Studies:  Labs and Studies from the last 24hrs per EMR and Reviewed    Objective:  Temp:  [35.9 ??C-37.2 ??C] 35.9 ??C  Heart Rate:  [88-111] 88  SpO2 Pulse:  [111] 111  Resp:  [16-22] 18  BP: (127-148)/(67-99) 127/74  SpO2:  [93 %-98 %] 94 %,   Intake/Output Summary (Last 24 hours) at 03/19/17 0817  Last data filed at 03/18/17 1928   Gross per 24 hour   Intake              100 ml   Output 0 ml   Net              100 ml       General Appearance: Well appearing. In no acute distress. Overweight.  HEENT: Head is atraumatic and normocephalic. Sclera anicteric without injection. Oropharyngeal membranes are moist with no erythema or exudate.  Neck: Supple.  Lungs: Bilateral lower lobes coarser breath sounds. Upper lobes breath sounds quite good.   Heart: Regular rate and rhythm. No murmurs, rubs, or gallops.  Abdomen: Soft, nontender, nondistended.  Extremities: No clubbing, cyanosis, or edema.      Helyn App, MD  PGY-1 Resident

## 2017-03-19 NOTE — Unmapped (Signed)
Patient transported to X-ray  Transported by Radiology  How tranported Wheelchair  Cardiac Monitor no

## 2017-03-19 NOTE — Unmapped (Signed)
Care Management  Initial Transition Planning Assessment              General  Care Manager assessed the patient by : In person interview with patient, Medical record review, Discussion with Clinical Care team  Orientation Level: Oriented X4  Who provides care at home?: N/A    Contact/Decision Maker:    Contact Details  Contact Details: Primary Contact  Primary Contact Name: Benuel Ly  Primary Contact Relationship: Spouse  Phone #1: [phone]    Advance Directive (Medical Treatment)  Does patient have an advance directive covering medical treatment?: Patient has advance directive covering medical treatment, copy not in chart.  Type of advance directive: : Other (Comment), Living will  Reason patient does not have an advance directive covering medical treatment:: Patient needs follow-up to complete one.  Surrogate decision maker appointed:: [facility] only) Other authorized decision-maker (Comment on relationship to patient)  Surrogate decision maker's name:: Kyre Jeffries, spouse  Surrogate decision maker's phone number:: [phone]  Information provided on advance directive:: Yes  Patient requests assistance:: Yes, advice to complete post discharge    Advance Directive (Mental Health Treatment)  Does patient have an advance directive covering mental health treatment?: Patient does not have advance directive covering mental health treatment.  Reason patient does not have an advance directive covering mental health treatment:: Patient does not wish to complete one at this time.    Patient Information:    Lives with: Spouse/significant other    Type of Residence: Private residence    Type of Residence: Mailing Address:  [address]  [place] [address]  Contacts: Accompanied by: Alone  Password: gideon   Contact Details: Primary Contact  Primary Contact Name: Rutherford Guys  Primary Contact Relationship: Spouse  Phone #1: [phone]  Patient Phone Number: [phone] (home)         Medical Provider(s): No PCP Per Patient  Reason for Admission: Admitting Diagnosis:  Cystic fibrosis (CMS-HCC) [E84.9]  Cystic fibrosis with pulmonary exacerbation (CMS-HCC) [E84.0]  Past Medical History:   has a past medical history of Anxiety; Chronic pain disorder; Cystic fibrosis (CMS-HCC); Depression; and Hypertension.  Past Surgical History:   has no past surgical history on file.   Previous admit date: 02/05/2017    Primary Insurance- Payor: BCBS / Plan: BCBS OOS PLAN / Product Type: *No Product type* /   Secondary Insurance ??? None  Prescription Coverage ???   Preferred Pharmacy - [id number] [place], [place] - [address]  [facility] - , [place] - [address]  [facility] [place] [facility] [place] [address]    Transportation home: Private vehicle  Level of function prior to admission: Independent     Location/Detail: [address]  [place] [address]    Support Systems: Family Members, Spouse, Friends/Neighbors    Responsibilities/Dependents at home?: No    Home Care services in place prior to admission?: No     Outpatient/Community Resources in place prior to admission: Clinic  Agency detail (Name/Phone #): [facility]    Equipment Currently Used at Home: respiratory supplies  Current HME Agency (Name/Phone #): nebulizer, percussion vest, ?Pt doesnt remember DME    Currently receiving outpatient dialysis?: No     Financial Information:     Patient source of income: Wife works, pt has SSDI    Need for financial assistance?: No     Discharge Needs Assessment:    Concerns to be Addressed:  denies needs/concerns at this time    Clinical Risk Factors: Multiple Diagnoses (Chronic)    Barriers to taking medications: No    Prior overnight hospital stay or ED visit in last 90 days: Yes    Readmission Within the Last 30 Days: previous discharge plan unsuccessful    Anticipated Changes Related to Illness: none    Equipment Needed After Discharge: none    Discharge Facility/Level of Care Needs:  (home to self care)    Patient at risk for readmission?: Yes    Discharge Plan:    Screen findings are: Care Manager reviewed the plan of the patient's care with the Multidisciplinary Team. No discharge planning needs identified at this time. Care Manager will continue to manage plan and monitor patient's progress with the team.    Expected Discharge Date: 04/02/17    Expected Transfer from Critical Care:      Patient and/or family were provided with choice of facilities / services that are available and appropriate to meet post hospital care needs?: Yes   List choices in order highest to lowest preferred, if applicable. : Has used [facility] in the past    Initial Assessment complete?: Yes

## 2017-03-19 NOTE — Unmapped (Signed)
Emergency Department Provider Note        ED Clinical Impression     Final diagnoses:   Cystic fibrosis (CMS-HCC) (Primary)       ED Assessment/Plan   Pt sent by pulm for admission for Cf exacerbation. Supposed to be direct admit, but no beds available. Pt states recent treatment for ear and sinus infection, now with change in phlem and worsening SOB. abx ordered based on last admission (1 month ago), CXR, labs, cultures.    History     Chief Complaint   Patient presents with   ??? Cough     HPI  31 yo male with a cystic fibrosis (genotype [id number] and [id number]), HTN, pancreatic insufficiency, h/o DVTs, and depression presenting to the ED as a direct admission for CF exacerbation. Patient's last admission was 02/05/17 for treatment of CF exacerbation. He was discharged on 02/18/17 after receiving IV tobramycin and ceftazidime, as well as azithromycin and clofazimine for mycobacterium.    Patient states that he was doing relatively well following his last discharge until a recent trip [place] an amusement park on [place]. He reports that following this trip, he began experiencing sinus pressure/pain, left ear pain, and some shortness of breath increased from baseline. He went to his PCP on 8/2 where he was diagnosed with bacterial sinusitis and prescribed a 10-day course of augmentin. He has been taking the antibiotics as prescribed, but states that his shortness of breath and sinus pain have gotten worse. He has also developed a productive cough with occasional streaks of hemoptysis, pleuritic chest pain, and increased SOB/DOE. He also endorses one episode of emesis following a coughing fit, intermittent subjective fever, constipation, night sweats and reduced appetite with transient nausea. Denies hx of PE, BLE swelling/pain, or recent travels.    Patient reported his current symptoms to his outpatient pulmonologist who encouraged him to present to the emergency department for treatment. Last FEV1 on 02/21/17 was 73%    Past Medical History:   Diagnosis Date   ??? Anxiety    ??? Chronic pain disorder    ??? Cystic fibrosis (CMS-HCC)    ??? Depression    ??? Hypertension        No past surgical history on file.    Family History   Problem Relation Age of Onset   ??? Bipolar disorder Mother    ??? Depression Mother        Social History     Social History   ??? Marital status: Married     Spouse name: N/A   ??? Number of children: N/A   ??? Years of education: N/A     Social History Main Topics   ??? Smoking status: Never Smoker   ??? Smokeless tobacco: Never Used   ??? Alcohol use No   ??? Drug use: No   ??? Sexual activity: Yes     Partners: Female     Birth control/ protection: Condom     Other Topics Concern   ??? None     Social History Narrative   ??? None       Review of Systems   All other systems reviewed and are negative.  A 10 point review of systems was performed and negative except as noted in the history of present illness.  No fever, malaise  No chest pain except cough  No new abd pain, + V with cough only  + left ear pain, and left sided sinus tn  No ST  No  MSK pain, joint aches  No thoughts of depression, anxiety  No new rashes  No neuro symptoms  + inc SOB      Physical Exam     BP 147/91  - Pulse 111  - Temp 36.9 ??C (98.4 ??F) (Oral)  - Resp 22  - SpO2 (S) 96%     Physical Exam   Constitutional: He appears well-developed.   HENT:   Head: Normocephalic and atraumatic.   Right Ear: Tympanic membrane, external ear and ear canal normal.   Left Ear: Hearing normal. There is tenderness. Tympanic membrane is erythematous.   Nose: Nose normal.   Mouth/Throat: Oropharynx is clear and moist.   Eyes: Pupils are equal, round, and reactive to light.   Neck: Neck supple.   Cardiovascular: Normal rate and regular rhythm.  Exam reveals no gallop and no friction rub.    No murmur heard.  Pulmonary/Chest:   Decreased work of breathing bilaterally in upper and lower lung fields, no ronchi/wheezing/rales, pleuritic chest pain with deep inspiration   Abdominal: Soft. He exhibits no mass. There is no tenderness. There is no rebound and no guarding.   Musculoskeletal: He exhibits no edema or tenderness.   Attending addendum to note above :Left TM with very mld erythema, canal OK, + left sided sinus tn.Lung exam should be as follows: good air movement, No increase work of breathing, no crackles, no wheezing    ED Course     Blood and sputum cultures ordered, currently pending. CXR negative for pulmonary pathology. IV antibitioics ordered, IV tobramycin and ceftazidime. Patient has remained hemodynamically stable, in no acute respiratory distress, satting well on room air. Will initiate aggressive pulmonary toilet as tolerated. MAO aware of patient- plan to admit to MED D.    6:54 PM  Xr Chest 2 Views  Sequelae of cystic fibrosis, similar to prior.    8:44 PM  Pulm here to evaluate patient. They will take over care and admit      Coding     _________________________________  Documentation assistance was provided by Devin Going, Scribe, on March 18, 2017 at 9:37 PM for Sherrie Mustache, MD.  March 19, 2017 11:41 AM. Documentation assistance provided by the scribe. I was present during the time the encounter was recorded. The information recorded by the scribe was done at my direction and has been reviewed and validated by me.        Buford Dresser, MD  03/19/17 1141

## 2017-03-19 NOTE — Unmapped (Signed)
Patient rounds completed. The following patient needs were addressed:  Plan of Care, Call Bell in Reach and Bed Position Low .    Patient sitting on side of bed. NAD, attempting to provide sputum culture. Wife at bedside. Pt reports increasing pain, will notify MD as patient isn't do for PRN again yet.

## 2017-03-19 NOTE — Unmapped (Signed)
..  Adult Inpatient CF Initial Assessment        Christian Young is a 31 y.o. male who was admitted for Cystic fibrosis (CMS-HCC) [E84.9]  Cystic fibrosis with pulmonary exacerbation (CMS-HCC) [E84.0].    Heart Rate: 118  Respiratory Rate: 20  Bilateral Breath Sounds: diminished with scattered rales    Cough/Sputum: How Much? large                              Color: dark green    Are they coughing up blood? Yes:  Streaks of blood                                                     No:                                                      How Much? Very small     Home medication & frequency :    Albuterol Neb 2.5mg  bid  Hypertonic Saline bid  Pulmozyme 2.5mg  Qday  Symbicort HFA     Home airway clearance & frequency:    Vest bid  [facility] for Inhaled Medications and Airway Clearance:  10 am    Methods tried & found to be ineffective:  metaneb and vibralung      Home O2? Yes:                       No:no  If yes, what is their home requirement:     Home non-invasive ventilation? Yes: yes                                                         No:  If yes, enter type of device, settings, & mode: states he has a bipap but does not use it    Airway clearance technique method offered & decided upon:   Chest vest  Comments: patient has opted to start meds and therapy in the am.

## 2017-03-19 NOTE — Unmapped (Addendum)
Problem: Patient Care Overview  Goal: Plan of Care Review  Outcome: Progressing  Patient admitted from ED for CF ex.  Maintains O2 sats WNL on room air and denies SOB or DOE.  Chest pain adequately controlled with currently ordered PRN oxycodone.  Reiterated pain management plan to patient and that the IV morphine he received q2h in the ED was not in his pain management plan.  Port accessed with 20G 1 power needle and blood return is present.  Awaiting sputum sample for AFB from patient.  Patient denies SI or self-harm behaviors or thoughts.  Patient does not have home Viibryd at this time - states that wife may not be able to visit with it until Friday.  Continue POC.   03/19/17 0114   OTHER   Plan of Care Reviewed With patient   Plan of Care Review   Progress no change     Goal: Individualization and Mutuality  Outcome: Progressing   03/19/17 0114   OTHER   What Anxieties, Fears, Concerns, or Questions Do You Have About Your Care? denies    Individualization   Patient Specific Preferences Call me [facility]   Patient Specific Goals (Include Timeframe) patient will improve PFTs by d/c    Patient Specific Interventions airway clearance, antibiotics     Goal: Discharge Needs Assessment  Outcome: Progressing   03/19/17 0114   OTHER   Equipment Currently Used at Home respiratory supplies   Discharge Needs Assessment   Concerns to be Addressed no discharge needs identified   Readmission Within the Last 30 Days previous discharge plan unsuccessful   Patient/Family Anticipates Transition to home   Patient/Family Anticipated Services at Transition none   Transportation Anticipated family or friend will provide   Anticipated Changes Related to Illness none   Equipment Needed After Discharge none   Current Discharge Risk chronically ill;psychiatric illness       Problem: Cystic Fibrosis (Adult)  Intervention: Optimize Nutritional Intake   03/19/17 0114   Interventions   Oral Nutrition Promotion calorie dense foods provided;nutritional therapy counseling provided;physical activity promoted       Goal: Signs and Symptoms of Listed Potential Problems Will be Absent, Minimized or Managed (Cystic Fibrosis)  Signs and symptoms of listed potential problems will be absent, minimized or managed by discharge/transition of care (reference Cystic Fibrosis (Adult) CPG).   Outcome: Progressing   03/19/17 0114   Cystic Fibrosis (Adult)   Problems Present (Cystic Fibrosis) infection;malabsorption/maldigestion;undernutrition/vitamin and mineral deficiency;pulmonary/respiratory complications       Problem: VTE, DVT and PE (Adult)  Goal: Signs and Symptoms of Listed Potential Problems Will be Absent, Minimized or Managed (VTE, DVT and PE)  Signs and symptoms of listed potential problems will be absent, minimized or managed by discharge/transition of care (reference VTE, DVT and PE (Adult) CPG).   Outcome: Progressing

## 2017-03-20 LAB — CBC
HEMATOCRIT: 36.7 % — ABNORMAL LOW (ref 41.0–53.0)
HEMOGLOBIN: 11.9 g/dL — ABNORMAL LOW (ref 13.5–17.5)
MEAN CORPUSCULAR HEMOGLOBIN CONC: 32.6 g/dL (ref 31.0–37.0)
MEAN CORPUSCULAR HEMOGLOBIN: 26.3 pg (ref 26.0–34.0)
MEAN CORPUSCULAR VOLUME: 80.8 fL (ref 80.0–100.0)
PLATELET COUNT: 302 10*9/L (ref 150–440)
RED BLOOD CELL COUNT: 4.54 10*12/L (ref 4.50–5.90)
WBC ADJUSTED: 5.6 10*9/L (ref 4.5–11.0)

## 2017-03-20 LAB — COMPREHENSIVE METABOLIC PANEL
ALBUMIN: 3.7 g/dL (ref 3.5–5.0)
ALT (SGPT): 90 U/L — ABNORMAL HIGH (ref 19–72)
ANION GAP: 10 mmol/L (ref 9–15)
AST (SGOT): 44 U/L (ref 19–55)
BLOOD UREA NITROGEN: 14 mg/dL (ref 7–21)
BUN / CREAT RATIO: 16
CALCIUM: 8.7 mg/dL (ref 8.5–10.2)
CHLORIDE: 100 mmol/L (ref 98–107)
CO2: 26 mmol/L (ref 22.0–30.0)
CREATININE: 0.87 mg/dL (ref 0.70–1.30)
EGFR MDRD AF AMER: >=60 mL/min/{1.73_m2} (ref >=60)
EGFR MDRD NON AF AMER: >=60 mL/min/{1.73_m2} (ref >=60)
GLUCOSE RANDOM: 119 mg/dL (ref 65–179)
POTASSIUM: 4.4 mmol/L (ref 3.5–5.0)
PROTEIN TOTAL: 6.6 g/dL (ref 6.5–8.3)
SODIUM: 136 mmol/L (ref 135–145)

## 2017-03-20 LAB — MAGNESIUM: Magnesium:MCnc:Pt:Ser/Plas:Qn:: 1.7

## 2017-03-20 LAB — AST (SGOT): Aspartate aminotransferase:CCnc:Pt:Ser/Plas:Qn:: 44

## 2017-03-20 LAB — HEMOGLOBIN: Lab: 11.9 — ABNORMAL LOW

## 2017-03-20 LAB — AMIKACIN RANDOM: Amikacin^random:MCnc:Pt:Ser/Plas:Qn:: 19.2

## 2017-03-20 NOTE — Unmapped (Signed)
Problem: Patient Care Overview  Goal: Plan of Care Review  Outcome: Progressing  Patient generally feels unwell.  States that this is the worst CF exacerbation he's ever had.  He has still be very motivated and is being compliant with exercise and all treatments.  Give PRN pain medications for complaints of chest pain brought on by coughing.  Some changes made to antibiotic treatment.  Plan for CT later tonight.  Continue POC.  Goal: Individualization and Mutuality  Outcome: Progressing   03/19/17 0114   OTHER   What Anxieties, Fears, Concerns, or Questions Do You Have About Your Care? denies    Individualization   Patient Specific Preferences Call me [facility]   Patient Specific Goals (Include Timeframe) patient will improve PFTs by d/c    Patient Specific Interventions airway clearance, antibiotics     Goal: Interprofessional Rounds/Family Conf  Outcome: Progressing      Problem: Cystic Fibrosis (Adult)  Goal: Signs and Symptoms of Listed Potential Problems Will be Absent, Minimized or Managed (Cystic Fibrosis)  Signs and symptoms of listed potential problems will be absent, minimized or managed by discharge/transition of care (reference Cystic Fibrosis (Adult) CPG).   Outcome: Progressing      Problem: VTE, DVT and PE (Adult)  Goal: Signs and Symptoms of Listed Potential Problems Will be Absent, Minimized or Managed (VTE, DVT and PE)  Signs and symptoms of listed potential problems will be absent, minimized or managed by discharge/transition of care (reference VTE, DVT and PE (Adult) CPG).   Outcome: Progressing

## 2017-03-20 NOTE — Unmapped (Signed)
Problem: Patient Care Overview  Goal: Plan of Care Review  Outcome: Progressing    Goal: Individualization and Mutuality  Outcome: Progressing    Goal: Discharge Needs Assessment  Outcome: Progressing    Goal: Interprofessional Rounds/Family Conf  Outcome: Progressing      Problem: Cystic Fibrosis (Adult)  Goal: Signs and Symptoms of Listed Potential Problems Will be Absent, Minimized or Managed (Cystic Fibrosis)  Signs and symptoms of listed potential problems will be absent, minimized or managed by discharge/transition of care (reference Cystic Fibrosis (Adult) CPG).   Outcome: Progressing      Problem: VTE, DVT and PE (Adult)  Goal: Signs and Symptoms of Listed Potential Problems Will be Absent, Minimized or Managed (VTE, DVT and PE)  Signs and symptoms of listed potential problems will be absent, minimized or managed by discharge/transition of care (reference VTE, DVT and PE (Adult) CPG).   Outcome: Progressing

## 2017-03-20 NOTE — Unmapped (Signed)
Problem: Cystic Fibrosis (Adult)  Goal: Signs and Symptoms of Listed Potential Problems Will be Absent, Minimized or Managed (Cystic Fibrosis)  Signs and symptoms of listed potential problems will be absent, minimized or managed by discharge/transition of care (reference Cystic Fibrosis (Adult) CPG).   Pt.took his breathing tx at this time.

## 2017-03-20 NOTE — Unmapped (Signed)
Medicine Daily Progress Note    Assessment/Plan:    Principal Problem:    Cystic fibrosis with pulmonary exacerbation (CMS-HCC)  Active Problems:    Essential hypertension    History of DVT (deep vein thrombosis)    Chronic pansinusitis    Pancreatic insufficiency    Mycobacterium abscessus infection  Resolved Problems:    * No resolved hospital problems. *      Christian Young??is a 31 y.o.??y/o male??with PMHx of CF with pancreatic insufficiency, CF sinus disease, HTN, depression, history of DVTs on chronic anticoagulation who??presents to [facility] with Cystic fibrosis with pulmonary exacerbation (CMS-HCC).  ??  CF with Bronchiectasis Exacerbation:??Diagnosed at age 27 following collapsed lung and psedomonas infection. [id number] mutation. Most recent PFTs 02/21/17 showed FEV1 74% which was at baseline if not better than previous. CXR on presentation??generally unchanged without focal infiltrates. Does report a few days of hemoptysis, streaking without clots which has happened during past exacerbations. Does have history of M. Abscessus, PsA, Steno and MRSA (by self report only). Primary pulmonologist is Dr. Koren Shiver, establishing care at [facility].   - Cultures  ????????????????????????- M abscessus 7/12, Smooth PsA 4/9 (Cipro, Levo, Tobra S) 12/17, Steno at [facility]  ????????????????????????- CF sputum culture ordered and sent  ????????????????????????- AFB culture/smear ordered  - Abx  ????????????????????????- Ceftax (8/6 - ?), s/p Tobra (8/6-8/7)              - Amikacin (8/7 - ?)  ????????????????????????- Home Azithro 500 daily, Clofazamine  - HTS nebs 7%, Albuterol q6, Teza/iva continued  - Airway clearance (home chest vest), adding further clearance as per Respiratory  - Hold Pulmozyme given hemoptysis at this time, Holding home inhaled Amikacin    - Chest CT w.o contrast: cystic fibrosis including severe scarring and volume loss with bronchiectasis involving the right upper lobe; bilateral lower lobes compatible with infection.  ??  Acute on Chronic Sinusitis: Prescribed Augmentin on 8/2 by PCP. CT inus in May 2018 showed sequelae of previous sinus surgery and bilateral mucosal thickening.  - Home flonase, zyrtec, singulair   - bicarb/sodium chloride (700/2300mg ) irrigation. Pt may bring from home- nonformulary   ??  Pancreatic insufficiency 2/2 CF:??Wt 253lb (baseline 255-265)  - Home Creon 7 caps with meals, 5 with snacks (home Creaon which has 36,000 lipase per pill, re-ordered this for meals if he has, but could not re-order with snacks so ordered the 24,000 lipase pill for snacks, will need to convert meal time to appropriate 11 caps with meals)  - Dietician:    - fecal elastase: pending   - MVW Complete Formulation gel cap 2 daily    - check des-gama-carboxy-PT (DCP): pending  ??  History of DVTs:??Per history may be both provoked and unprovoked, on chronic Xarelto 20mg  at home. Has not taken Xarelto since mid last week 2/2 inability to fill prescription  - Hold xarelto in setting of hemoptysis for now, will reassess for ongoing hemoptysis  - Will do Heparin SQ at DVT ppx dosing for now to provide some anticoagulation  ??  HTN:??140/99 on presentation   - Home Lisinopril, Metoprolol   - PRN hydralazine  ??  Restless legs:  - Continue home mirapex 0.25 mg BID  - Continue home Lyrica 100 mg by mouth twice daily  Depression and history of SI:??Doing well, denies SI at this time  - Continue home lamictal and vybrid   ??  Code Status:  Full Code    ___________________________________________________________________    Subjective:  No  acute events overnight, Pt was seen at bedside. he was explained the results of his CT scan and understood what was going on. He mentions that he would like to speak about new airway clearance ideas we can provide him and consider any and all options. Pt otherwise was doing well and still endorses green stools    Labs/Studies:  Labs and Studies from the last 24hrs per EMR and Reviewed    Objective:  Temp:  [36 ??C-36.8 ??C] 36.2 ??C  Heart Rate:  [88-110] 88 Resp:  [18-20] 18  BP: (142-162)/(65-92) 146/65  SpO2:  [93 %-97 %] 93 %,   Intake/Output Summary (Last 24 hours) at 03/20/17 0700  Last data filed at 03/19/17 2100   Gross per 24 hour   Intake              660 ml   Output                0 ml   Net              660 ml       General Appearance: Well appearing. In no acute distress. Overweight.  HEENT: Head is atraumatic and normocephalic. Sclera anicteric without injection. Oropharyngeal membranes are moist with no erythema or exudate.  Neck: Supple.  Lungs: Bilateral lower lobes coarser breath sounds. Upper lobes breath sounds quite good.   Heart: Regular rate and rhythm. No murmurs, rubs, or gallops.  Abdomen: Soft, nontender, nondistended.  Extremities: No clubbing, cyanosis, or edema.      Helyn App, MD  PGY-1 Resident

## 2017-03-21 LAB — MEAN CORPUSCULAR HEMOGLOBIN CONC: Lab: 33.5

## 2017-03-21 LAB — CBC
HEMATOCRIT: 35.9 % — ABNORMAL LOW (ref 41.0–53.0)
HEMOGLOBIN: 12 g/dL — ABNORMAL LOW (ref 13.5–17.5)
MEAN CORPUSCULAR HEMOGLOBIN CONC: 33.5 g/dL (ref 31.0–37.0)
MEAN CORPUSCULAR HEMOGLOBIN: 27.1 pg (ref 26.0–34.0)
MEAN PLATELET VOLUME: 7.1 fL (ref 7.0–10.0)
RED BLOOD CELL COUNT: 4.44 10*12/L — ABNORMAL LOW (ref 4.50–5.90)
RED CELL DISTRIBUTION WIDTH: 15.4 % — ABNORMAL HIGH (ref 12.0–15.0)
WBC ADJUSTED: 5.7 10*9/L (ref 4.5–11.0)

## 2017-03-21 LAB — COMPREHENSIVE METABOLIC PANEL
ALBUMIN: 3.6 g/dL (ref 3.5–5.0)
ALKALINE PHOSPHATASE: 65 U/L (ref 38–126)
ALT (SGPT): 79 U/L — ABNORMAL HIGH (ref 19–72)
ANION GAP: 13 mmol/L (ref 9–15)
AST (SGOT): 36 U/L (ref 19–55)
BILIRUBIN TOTAL: 0.3 mg/dL (ref 0.0–1.2)
BLOOD UREA NITROGEN: 16 mg/dL (ref 7–21)
BUN / CREAT RATIO: 20
CALCIUM: 8.7 mg/dL (ref 8.5–10.2)
CHLORIDE: 99 mmol/L (ref 98–107)
CO2: 26 mmol/L (ref 22.0–30.0)
CREATININE: 0.82 mg/dL (ref 0.70–1.30)
GLUCOSE RANDOM: 176 mg/dL (ref 65–179)
PROTEIN TOTAL: 6.5 g/dL (ref 6.5–8.3)
SODIUM: 138 mmol/L (ref 135–145)

## 2017-03-21 LAB — AMIKACIN RANDOM
Amikacin^random:MCnc:Pt:Ser/Plas:Qn:: 3
Amikacin^random:MCnc:Pt:Ser/Plas:Qn:: 31

## 2017-03-21 LAB — MAGNESIUM: Magnesium:MCnc:Pt:Ser/Plas:Qn:: 1.6

## 2017-03-21 LAB — SODIUM: Sodium:SCnc:Pt:Ser/Plas:Qn:: 138

## 2017-03-21 NOTE — Unmapped (Signed)
Problem: Patient Care Overview  Goal: Plan of Care Review  Outcome: Progressing  Patient AxOx4. VSS. Requested 2L [place] for comfort. No signs of acute distress. Will continue to monitor.   Goal: Individualization and Mutuality  Outcome: Progressing    Goal: Discharge Needs Assessment  Outcome: Progressing    Goal: Interprofessional Rounds/Family Conf  Outcome: Progressing      Problem: Cystic Fibrosis (Adult)  Goal: Signs and Symptoms of Listed Potential Problems Will be Absent, Minimized or Managed (Cystic Fibrosis)  Signs and symptoms of listed potential problems will be absent, minimized or managed by discharge/transition of care (reference Cystic Fibrosis (Adult) CPG).   Outcome: Progressing      Problem: VTE, DVT and PE (Adult)  Goal: Signs and Symptoms of Listed Potential Problems Will be Absent, Minimized or Managed (VTE, DVT and PE)  Signs and symptoms of listed potential problems will be absent, minimized or managed by discharge/transition of care (reference VTE, DVT and PE (Adult) CPG).   Outcome: Progressing

## 2017-03-21 NOTE — Unmapped (Signed)
Problem: Cystic Fibrosis (Adult)  Goal: Signs and Symptoms of Listed Potential Problems Will be Absent, Minimized or Managed (Cystic Fibrosis)  Signs and symptoms of listed potential problems will be absent, minimized or managed by discharge/transition of care (reference Cystic Fibrosis (Adult) CPG).   Patient did well with doing all of their treatments today. Patient did their airway clearance without complications.

## 2017-03-21 NOTE — Unmapped (Signed)
[facility]  Adult Cystic Fibrosis Clinic    As part of [facility] [employer], [place] gave patient $50 gift card for food due to financial difficulties and associated food insecurity.        Mercy Moore, LCSW

## 2017-03-21 NOTE — Unmapped (Signed)
Medicine Daily Progress Note    Assessment/Plan:    Principal Problem:    Cystic fibrosis with pulmonary exacerbation (CMS-HCC)  Active Problems:    Essential hypertension    History of DVT (deep vein thrombosis)    Chronic pansinusitis    Pancreatic insufficiency    Mycobacterium abscessus infection  Resolved Problems:    * No resolved hospital problems. *      Christian Youngis a 31 y.o.??y/o male??with PMHx of CF with pancreatic insufficiency, CF sinus disease, HTN, depression, history of DVTs on chronic anticoagulation who??presents to [facility] with Cystic fibrosis with pulmonary exacerbation (CMS-HCC).  ??  CF with Bronchiectasis Exacerbation:??Diagnosed at age 40 following collapsed lung and psedomonas infection. [phone] mutation. Most recent PFTs 02/21/17 showed FEV1 74% which was at baseline if not better than previous. CXR on presentation??generally unchanged without focal infiltrates. Does report a few days of hemoptysis, streaking without clots which has happened during past exacerbations. Does have history of M. Abscessus, PsA, Steno and MRSA (by self report only). Primary pulmonologist is Dr. Koren Shiver, establishing care at [facility].   - Cultures  ????????????????????????- M abscessus 7/12, Smooth PsA 4/9 (Cipro, Levo, Tobra S) 12/17, Steno at [facility]  ????????????????????????- CF sputum culture ordered and sent  ????????????????????????- AFB culture/smear ordered  - Abx  ????????????????????????- Ceftax (8/6 - ?), s/p Tobra (8/6-8/7)  ????????????????????????- Amikacin (8/7 - ?)  ????????????????????????- Home Azithro 500 daily, Clofazamine  - HTS nebs 7%, Albuterol q6, Teza/iva continued  - Airway clearance (home chest vest), adding further clearance as per Respiratory  - Hold Pulmozyme given hemoptysis at this time, Holding home inhaled Amikacin ??  - Chest CT w.o contrast: cystic fibrosis including severe scarring and volume loss with bronchiectasis involving the right upper lobe; bilateral lower lobes compatible with infection.  ??  Acute on Chronic Sinusitis: Prescribed Augmentin on 8/2 by PCP. CT inus in May 2018 showed sequelae of previous sinus surgery and bilateral mucosal thickening.  - Home flonase, zyrtec, singulair   - bicarb/sodium chloride (700/2300mg ) irrigation. Pt may bring from home- nonformulary   ??  Pancreatic insufficiency 2/2 CF:??Wt 253lb (baseline 255-265)  - Home Creon 7 caps with meals, 5 with snacks (home Creaon which has 36,000 lipase per pill, re-ordered this for meals if he has, but could not re-order with snacks so ordered the 24,000 lipase pill for snacks, will need to convert meal time to appropriate 11 caps with meals)  - Dietician:               - fecal elastase: pending              - MVW Complete Formulation gel cap 2 daily??              - check des-gama-carboxy-PT (DCP): pending  ??  History of DVTs:??Per history may be both provoked and unprovoked, on chronic Xarelto 20mg  at home. Has not taken Xarelto since mid last week 2/2 inability to fill prescription  - Hold xarelto in setting of hemoptysis for now, will reassess for ongoing hemoptysis  - Will do Heparin SQ at DVT ppx dosing for now to provide some anticoagulation  - Speak about Eliquis vs. Xarelto on d/c  ??  HTN:??140/99 on presentation   - Home Lisinopril, Metoprolol   - PRN hydralazine  ??  Restless legs:  - Continue home mirapex 0.25 mg BID  - Continue home Lyrica 100 mg by mouth twice daily  Depression and history of SI:??Doing well, denies SI at  this time  - Continue home lamictal and vybrid   ??  Code Status:  Full Code    ___________________________________________________________________    Subjective:  No acute events overnight, Patient states pain well controlled, Tolerating diet without difficulty, Feels that breathing is improved    Labs/Studies:  Labs and Studies from the last 24hrs per EMR and Reviewed    Objective:  Temp:  [35.8 ??C-36.8 ??C] 36.7 ??C  Heart Rate:  [89-102] 96  Resp:  [18] 18  BP: (143-146)/(65-77) 145/69  SpO2:  [95 %-100 %] 96 %,   Intake/Output Summary (Last 24 hours) at 03/21/17 0810  Last data filed at 03/21/17 0700   Gross per 24 hour   Intake              240 ml   Output                0 ml   Net              240 ml       General Appearance: Well appearing. In no acute distress. Overweight.  HEENT: Head is atraumatic and normocephalic. Sclera anicteric without injection. Oropharyngeal membranes are moist with no erythema or exudate.  Neck: Supple.  Lungs: Bilateral lower lobes coarser breath sounds. Upper lobes breath sounds quite good. Similar exam to previous days.   Heart: Regular rate and rhythm. No murmurs, rubs, or gallops.  Abdomen: Soft, nontender, nondistended.  Extremities: No clubbing, cyanosis, or edema.      Helyn App, MD  PGY-1 Resident

## 2017-03-21 NOTE — Unmapped (Signed)
Problem: Patient Care Overview  Goal: Individualization and Mutuality  Outcome: Progressing   03/19/17 0114 03/21/17 1619   OTHER   What Anxieties, Fears, Concerns, or Questions Do You Have About Your Care? denies  --    Individualization   Patient Specific Goals (Include Timeframe) --  To engage in airway clearence despite discomfort and pain   Patient Specific Interventions --  Offer pain medication and encourage airway clearence and walkiing.     Goal: Discharge Needs Assessment  Outcome: Progressing    Goal: Interprofessional Rounds/Family Conf  Outcome: Progressing   03/21/17 1619   Interdisciplinary Rounds/Family Conf   Participants nursing;patient       Problem: Cystic Fibrosis (Adult)  Intervention: Monitor Stool Output/Characteristics   03/21/17 1619   Interventions   Bowel Dysfunction Management hygiene measures promoted;relaxation techniques promoted;sitting position facilitated   Medication Review/Management medications reviewed;high risk medications identified     Intervention: Maximize Oxygenation/Ventilation/Perfusion   03/21/17 1500 03/21/17 1619   Interventions   Head of Bed (HOB) HOB elevated --    Airway/Ventilation Management --  calming measures promoted;pulmonary hygiene promoted;airway patency maintained     Intervention: Promote Aggressive Pulmonary Hygiene and Secretion Management   03/19/17 2000   Interventions   Cough And Deep Breathing done independently per patient   Breathing Techniques/Airway Clearance deep/controlled cough encouraged     Intervention: Manage Suspected/Actual Infection   03/19/17 1720 03/21/17 1600   Interventions   Isolation Precautions --  contact precautions maintained   Infection Management aseptic technique maintained;cultures obtained and sent to lab --      Intervention: Optimize Nutritional Intake   03/21/17 1619   Interventions   Oral Nutrition Promotion calorie dense foods provided;calorie dense liquids provided;medicated     Intervention: Support/Optimize Psychosocial Response to Condition   03/21/17 1619   Interventions   Supportive Measures active listening utilized;positive reinforcement provided;self-reflection promoted;self-responsibility promoted;self-care encouraged       Goal: Signs and Symptoms of Listed Potential Problems Will be Absent, Minimized or Managed (Cystic Fibrosis)  Signs and symptoms of listed potential problems will be absent, minimized or managed by discharge/transition of care (reference Cystic Fibrosis (Adult) CPG).   Outcome: Progressing   03/21/17 1619   Cystic Fibrosis (Adult)   Problems Assessed (Cystic Fibrosis) malabsorption/maldigestion;pulmonary/respiratory complications;undernutrition/vitamin and mineral deficiency       Problem: VTE, DVT and PE (Adult)  Intervention: Prevent/Manage Thrombi/Emboli   03/21/17 1619   Interventions   VTE Prevention/Management ambulation promoted;anticoagulation therapy;bleeding precautions maintained;dorsiflexion/plantar flexion performed;fluids promoted   Bleeding Precautions blood pressure closely monitored;coagulation study results reviewed     Intervention: Monitor/Manage Pain   03/21/17 1619   Interventions   Medication Review/Management medications reviewed;high risk medications identified   Pain Management Interventions care clustered;diversional activity provided;medication offered but refused;pain management plan reviewed with patient/caregiver;pillow support provided;quiet environment facilitated       Goal: Signs and Symptoms of Listed Potential Problems Will be Absent, Minimized or Managed (VTE, DVT and PE)  Signs and symptoms of listed potential problems will be absent, minimized or managed by discharge/transition of care (reference VTE, DVT and PE (Adult) CPG).   Outcome: Progressing   03/21/17 1619   VTE, DVT and PE (Adult)   Problems Assessed (VTE, DVT, PE) all   Problems Present (VTE, DVT, PE) none       Comments: Pt alert and oriented, took medications as ordered, enzymes at bedside. Pt agreed to call RN for pain medication as needed before engaging in airway clearance. Pt ambulated in  hallways an independently engages in respiratory treatments. Will continue with plan of care.

## 2017-03-21 NOTE — Unmapped (Signed)
Problem: Cystic Fibrosis (Adult)  Goal: Signs and Symptoms of Listed Potential Problems Will be Absent, Minimized or Managed (Cystic Fibrosis)  Signs and symptoms of listed potential problems will be absent, minimized or managed by discharge/transition of care (reference Cystic Fibrosis (Adult) CPG).   Patient tolerated all respiratory medications and performed metaneb for airway clearance.

## 2017-03-22 LAB — BASIC METABOLIC PANEL
ANION GAP: 12 mmol/L (ref 9–15)
BLOOD UREA NITROGEN: 15 mg/dL (ref 7–21)
BUN / CREAT RATIO: 17
CALCIUM: 8.8 mg/dL (ref 8.5–10.2)
CHLORIDE: 99 mmol/L (ref 98–107)
CO2: 25 mmol/L (ref 22.0–30.0)
CREATININE: 0.87 mg/dL (ref 0.70–1.30)
EGFR MDRD AF AMER: >=60 mL/min/{1.73_m2} (ref >=60)
EGFR MDRD NON AF AMER: >=60 mL/min/{1.73_m2} (ref >=60)
SODIUM: 136 mmol/L (ref 135–145)

## 2017-03-22 LAB — VITAMIN D, TOTAL (25OH): Lab: 27.9

## 2017-03-22 LAB — MAGNESIUM: Magnesium:MCnc:Pt:Ser/Plas:Qn:: 1.5 — ABNORMAL LOW

## 2017-03-22 LAB — DES-G-CARBOXY PT: Acarboxyprothrombin:MCnc:Pt:Ser/Plas:Qn:: 0.2

## 2017-03-22 LAB — EGFR MDRD NON AF AMER: Glomerular filtration rate/1.73 sq M.predicted.non black:ArVRat:Pt:Ser/Plas/Bld:Qn:Creatinine-based formula (MDRD): >=60

## 2017-03-22 NOTE — Unmapped (Signed)
Problem: Patient Care Overview  Goal: Plan of Care Review  Outcome: Progressing  Patient tolerating tx well with nae.  Vs remain wnl.  Had no sob or dyspnea this shift.  Hr remains 110 at rest.  Will continue to monitor,  Goal: Individualization and Mutuality  Outcome: Progressing    Goal: Discharge Needs Assessment  Outcome: Progressing    Goal: Interprofessional Rounds/Family Conf  Outcome: Progressing      Problem: Cystic Fibrosis (Adult)  Goal: Signs and Symptoms of Listed Potential Problems Will be Absent, Minimized or Managed (Cystic Fibrosis)  Signs and symptoms of listed potential problems will be absent, minimized or managed by discharge/transition of care (reference Cystic Fibrosis (Adult) CPG).   Outcome: Progressing

## 2017-03-22 NOTE — Unmapped (Signed)
Patient refused to use Metaneb. Told me that it hurt his chest, made him short of breath and made it hard to breathe. Clinical specialist has been informed and will speak with patient.

## 2017-03-22 NOTE — Unmapped (Signed)
Medicine Daily Progress Note    Assessment/Plan:    Principal Problem:    Cystic fibrosis with pulmonary exacerbation (CMS-HCC)  Active Problems:    Essential hypertension    History of DVT (deep vein thrombosis)    Chronic pansinusitis    Pancreatic insufficiency    Mycobacterium abscessus infection  Resolved Problems:    * No resolved hospital problems. *      Christian Young??is a 31 y.o.??y/o male??with PMHx of CF with pancreatic insufficiency, CF sinus disease, HTN, depression, history of DVTs on chronic anticoagulation who??presents to [facility] with Cystic fibrosis with pulmonary exacerbation (CMS-HCC).  ??  CF with Acute Bronchiectasis Exacerbation  Most recent PFTs 02/21/17 showed FEV1 74% which was at baseline if not better than previous. History of M. Abscessus, PsA, Steno and MRSA (by self report only). Pulm: Epifanio Lesches.   - Cultures  ????????????????????????- M abscessus 7/12, Smooth PsA 4/9 (Cipro, Levo, Tobra S) 12/17, Steno at [facility]  ????????????????????????- CF sputum --> PsA   ????????????????????????- AFB culture/smear ordered --> no AFB on smear   - Abx  ????????????????????????- Ceftax (8/6 - ?), s/p Tobra (8/6-8/7)  ????????????????????????- Amikacin (8/7 - ?)  ????????????????????????- Home Azithro 500 daily, Clofazamine  - HTS nebs 7%, Albuterol q6, Teza/iva continued  - Airway clearance (home chest vest), adding further clearance as per Respiratory  - Hold Pulmozyme given hemoptysis at this time, Holding home inhaled Amikacin ??  - Chest CT w.o contrast: cystic fibrosis including severe scarring and volume loss with bronchiectasis involving the right upper lobe; bilateral lower lobes compatible with infection.  ??  Acute on Chronic Sinusitis: Prescribed Augmentin on 8/2 by PCP. CT inus in May 2018 showed sequelae of previous sinus surgery and bilateral mucosal thickening.  - Home flonase, zyrtec, singulair   - bicarb/sodium chloride (700/2300mg ) irrigation. Pt may bring from home- nonformulary   ??  Pancreatic insufficiency 2/2 CF:??Wt 253lb (baseline 255-265)  - Home Creon 7 caps with meals, 5 with snacks (home Creaon which has 36,000 lipase per pill, re-ordered this for meals if he has, but could not re-order with snacks so ordered the 24,000 lipase pill for snacks, will need to convert meal time to appropriate 11 caps with meals)  - Dietician:               - fecal elastase: pending              - MVW Complete Formulation gel cap 2 daily??              - check des-gama-carboxy-PT (DCP): pending  ??  History of DVTs:??Per history may be both provoked and unprovoked, on chronic Xarelto 20mg  at home. Has not taken Xarelto since mid last week 2/2 inability to fill prescription  - Hold xarelto in setting of hemoptysis for now, will reassess for ongoing hemoptysis  - Will do Heparin SQ at DVT ppx dosing for now to provide some anticoagulation  - Speak about Eliquis vs. Xarelto on d/c  ??  HTN:??140/99 on presentation   - Home Lisinopril, Metoprolol   - PRN hydralazine  ??  Restless legs:  - Continue home mirapex 0.25 mg BID  - Continue home Lyrica 100 mg by mouth twice daily  Depression and history of SI:??Doing well, denies SI at this time  - Continue home lamictal and vybrid   ??  Code Status:  Full Code    ___________________________________________________________________    Subjective:  No events overnight. Tolerating airway clearance. Still having  night sweats.     Labs/Studies:  Labs and Studies from the last 24hrs per EMR and Reviewed    Objective:  Temp:  [35.8 ??C-37.1 ??C] 36.4 ??C  Heart Rate:  [98-110] 100  Resp:  [18] 18  BP: (124-158)/(61-86) 138/84  SpO2:  [93 %-95 %] 95 %, No intake or output data in the 24 hours ending 03/22/17 1344    General Appearance: Well appearing. In no acute distress. Overweight.  HEENT: Head is atraumatic and normocephalic. Sclera anicteric without injection. Oropharyngeal membranes are moist with no erythema or exudate.  Neck: Supple.  Lungs: Bilateral lower lobes coarser breath sounds. Upper lobes breath sounds quite good. Similar exam to previous days.   Heart: Regular rate and rhythm. No murmurs, rubs, or gallops.  Abdomen: Soft, nontender, nondistended.  Extremities: No clubbing, cyanosis, or edema.      Tildon Husky, MD  Internal Medicine, PGY-3  Pager [phone]

## 2017-03-23 NOTE — Unmapped (Signed)
Medicine Daily Progress Note    Assessment/Plan:    Principal Problem:    Cystic fibrosis with pulmonary exacerbation (CMS-HCC)  Active Problems:    Essential hypertension    History of DVT (deep vein thrombosis)    Chronic pansinusitis    Pancreatic insufficiency    Mycobacterium abscessus infection  Resolved Problems:    * No resolved hospital problems. *      Christian Young??is a 31 y.o.??y/o male??with PMHx of CF with pancreatic insufficiency, CF sinus disease, HTN, depression, history of DVTs on chronic anticoagulation who??presents to [facility] with Cystic fibrosis with pulmonary exacerbation (CMS-HCC).  ??  CF with Acute Bronchiectasis Exacerbation  Most recent PFTs 02/21/17 showed FEV1 74% which was at baseline if not better than previous. History of M. Abscessus, PsA, Steno and MRSA (by self report only). Pulm: Epifanio Lesches.   - Cultures  ????????????????????????- M abscessus 7/12, Smooth PsA 4/9 (Cipro, Levo, Tobra S) 12/17, Steno at [facility]  ????????????????????????- CF sputum --> PsA   ????????????????????????- AFB culture/smear??ordered --> no AFB on smear   - Abx  ????????????????????????- Ceftax (8/6 - ?), s/p Tobra (8/6-8/7)  ????????????????????????- Amikacin (8/7 - ?)  ????????????????????????- Home Azithro 500 daily, Clofazamine  - HTS nebs 7%, Albuterol q6, Teza/iva continued  - Airway clearance (home chest vest), adding further clearance as per Respiratory  - Hold Pulmozyme given hemoptysis at this time, Holding home inhaled Amikacin ??  - Chest CT w.o contrast: cystic fibrosis including severe scarring and volume loss with bronchiectasis involving the right upper lobe; bilateral lower lobes compatible with infection.  -V/Q scan Monday: pending  ??  Acute on Chronic Sinusitis: Prescribed Augmentin on 8/2 by PCP. CT sinus in May 2018 showed sequelae of previous sinus surgery and bilateral mucosal thickening.  - Home flonase, zyrtec, singulair   - bicarb/sodium chloride (700/2300mg ) irrigation. Pt may bring from home- nonformulary   ??  Pancreatic insufficiency 2/2 CF:??Wt 253lb (baseline 255-265)  - Home Creon 7 caps with meals, 5 with snacks (home Creaon which has 36,000 lipase per pill, re-ordered this for meals if he has, but could not re-order with snacks so ordered the 24,000 lipase pill for snacks, will need to convert meal time to appropriate 11 caps with meals)  - Dietician:   ????????????????????????- fecal elastase: pending  ????????????????????????- MVW Complete Formulation gel cap 2 daily??  ????????????????????????- check des-gama-carboxy-PT (DCP): pending  ??  History of DVTs:??Per history may be both provoked and unprovoked, on chronic Xarelto 20mg  at home. Has not taken Xarelto since mid last week 2/2 inability to fill prescription  - Hold xarelto in setting of hemoptysis for now, will reassess for ongoing hemoptysis  - Will do Heparin SQ at DVT ppx dosing for now to provide some anticoagulation  - Speak about Eliquis vs. Xarelto on d/c  ??  HTN:??140/99 on presentation   - Home Lisinopril, Metoprolol   - PRN hydralazine  ??  Restless legs:  - Continue home mirapex 0.25 mg BID  - Continue home Lyrica 100 mg by mouth twice daily  Depression and history of SI:??Doing well, denies SI at this time  - Continue home lamictal and vybrid   ??  Code Status:  Full Code  ??    ___________________________________________________________________    Subjective:  No acute events overnight, Denies CP or SOB, Tolerating diet without difficulty, Feels that breathing is improved    Labs/Studies:  Labs and Studies from the last 24hrs per EMR and Reviewed    Objective:  Temp:  [35.9 ??C-36.7 ??C] 35.9 ??C  Heart Rate:  [93-114] 95  Resp:  [18-20] 18  BP: (129-147)/(63-84) 129/63  SpO2:  [93 %-96 %] 93 %,   Intake/Output Summary (Last 24 hours) at 03/23/17 0820  Last data filed at 03/23/17 0454   Gross per 24 hour   Intake           478.44 ml   Output                0 ml   Net           478.44 ml       General Appearance: Well appearing. In no acute distress. Overweight.  HEENT: Head is atraumatic and normocephalic. Sclera anicteric without injection. Oropharyngeal membranes are moist with no erythema or exudate.  Neck: Supple.  Lungs: Bilateral lower lobes coarser breath sounds. Upper lobes breath sounds quite good. Similar exam to previous days.   Heart: Regular rate and rhythm. No murmurs, rubs, or gallops.  Abdomen: Soft, nontender, nondistended.  Extremities: clubbing. No cyanosis, or edema.  ??    Helyn App, MD  PGY-1 Resident

## 2017-03-23 NOTE — Unmapped (Signed)
Problem: Patient Care Overview  Goal: Plan of Care Review  Outcome: Progressing  Problem: Patient Care Overview  Goal: Plan of Care Review  Outcome: Progressing  Patient tolerating tx well with nae.  Vs remain wnl.  Had no sob or dyspnea this shift.  Hr remains elevated at rest.  Will continue to monitor,  Goal: Individualization and Mutuality  Outcome: Progressing  ??  Goal: Discharge Needs Assessment  Outcome: Progressing  ??  Goal: Interprofessional Rounds/Family Conf  Outcome: Progressing  ??  ??  Problem: Cystic Fibrosis (Adult)  Goal: Signs and Symptoms of Listed Potential Problems Will be Absent, Minimized or Managed (Cystic Fibrosis)  Signs and symptoms of listed potential problems will be absent, minimized or managed by discharge/transition of care (reference Cystic Fibrosis (Adult) CPG).   Outcome: Progressing

## 2017-03-23 NOTE — Unmapped (Signed)
Problem: Patient Care Overview  Goal: Plan of Care Review  Outcome: Progressing   03/22/17 0515   OTHER   Plan of Care Reviewed With patient   Plan of Care Review   Progress improving     Goal: Individualization and Mutuality  Outcome: Progressing   03/19/17 0114 03/21/17 1619   Individualization   Patient Specific Preferences Call me [name] --    Patient Specific Goals (Include Timeframe) --  To engage in airway clearence despite discomfort and pain   Patient Specific Interventions --  Offer pain medication and encourage airway clearence and walkiing.     Goal: Discharge Needs Assessment  Outcome: Progressing   03/19/17 1720   Discharge Needs Assessment   Concerns to be Addressed no discharge needs identified     Goal: Interprofessional Rounds/Family Conf  Outcome: Progressing   03/21/17 1619   Interdisciplinary Rounds/Family Conf   Participants nursing;patient       Problem: Cystic Fibrosis (Adult)  Intervention: Monitor Stool Output/Characteristics   03/21/17 1619   Interventions   Bowel Dysfunction Management hygiene measures promoted;relaxation techniques promoted;sitting position facilitated   Medication Review/Management medications reviewed;high risk medications identified      03/21/17 1619   Interventions   Bowel Dysfunction Management hygiene measures promoted;relaxation techniques promoted;sitting position facilitated   Medication Review/Management medications reviewed;high risk medications identified     Intervention: Maximize Oxygenation/Ventilation/Perfusion   03/21/17 1619 03/22/17 1700   Interventions   Head of Bed (HOB) --  HOB elevated   Airway/Ventilation Management calming measures promoted;pulmonary hygiene promoted;airway patency maintained --      Intervention: Promote Aggressive Pulmonary Hygiene and Secretion Management   03/19/17 2000   Interventions   Cough And Deep Breathing done independently per patient   Breathing Techniques/Airway Clearance deep/controlled cough encouraged Intervention: Manage Suspected/Actual Infection   03/22/17 1800   Interventions   Isolation Precautions contact precautions maintained     Intervention: Optimize Nutritional Intake   03/22/17 0800   Interventions   Oral Nutrition Promotion calorie dense foods provided;calorie dense liquids provided     Intervention: Support/Optimize Psychosocial Response to Condition   03/21/17 1619   Interventions   Supportive Measures active listening utilized;positive reinforcement provided;self-reflection promoted;self-responsibility promoted;self-care encouraged       Goal: Signs and Symptoms of Listed Potential Problems Will be Absent, Minimized or Managed (Cystic Fibrosis)  Signs and symptoms of listed potential problems will be absent, minimized or managed by discharge/transition of care (reference Cystic Fibrosis (Adult) CPG).   Outcome: Progressing   03/21/17 1059 03/21/17 1619   Cystic Fibrosis (Adult)   Problems Assessed (Cystic Fibrosis) --  malabsorption/maldigestion;pulmonary/respiratory complications;undernutrition/vitamin and mineral deficiency   Problems Present (Cystic Fibrosis) other (see comments) --        Problem: VTE, DVT and PE (Adult)  Intervention: Prevent/Manage Thrombi/Emboli   03/21/17 1619 03/22/17 0100   Interventions   VTE Prevention/Management --  anticoagulation therapy   Bleeding Precautions blood pressure closely monitored;coagulation study results rev   03/21/17 1619 03/22/17 0100   Interventions   VTE Prevention/Management --  anticoagulation therapy   Bleeding Precautions blood pressure closely monitored;coagulation study results reviewed --    iewed --      Intervention: Monitor/Manage Pain   03/21/17 1619   Interventions   Medication Review/Management medications reviewed;high risk medications identified   Pain Management Interventions care clustered;diversional activity provided;medication offered but refused;pain management plan reviewed with patient/caregiver;pillow support provided;quiet environment facilitated       Goal: Signs and Symptoms of Listed Potential  Problems Will be Absent, Minimized or Managed (VTE, DVT and PE)  Signs and symptoms of listed potential problems will be absent, minimized or managed by discharge/transition of care (reference VTE, DVT and PE (Adult) CPG).   Outcome: Progressing   03/22/17 1809   VTE, DVT and PE (Adult)   Problems Assessed (VTE, DVT, PE) all   Problems Present (VTE, DVT, PE) none       Comments: Pt alert and oriented, replaced central line dressing as edge was lifting up, needle change due 8/13-- Pt expressed some anxiety about lobectomy but felt reassured by MD counseling. Pt did airway clearance independently. Ambulated in room. No complaints of pain outside chronic pleuritic pain. Will continue with plan of care.

## 2017-03-24 LAB — AMIKACIN RANDOM: Amikacin^random:MCnc:Pt:Ser/Plas:Qn:: 2.6

## 2017-03-24 LAB — AMIKACIN PEAK: Amikacin^peak:MCnc:Pt:Ser/Plas:Qn:: 20.8

## 2017-03-24 NOTE — Unmapped (Signed)
Medicine Daily Progress Note    Assessment/Plan:    Principal Problem:    Cystic fibrosis with pulmonary exacerbation (CMS-HCC)  Active Problems:    Essential hypertension    History of DVT (deep vein thrombosis)    Chronic pansinusitis    Pancreatic insufficiency    Mycobacterium abscessus infection  Resolved Problems:    * No resolved hospital problems. *    ??  Christian Young??is a 31 y.o.??y/o male??with PMHx of CF with pancreatic insufficiency, CF sinus disease, HTN, depression, history of DVTs on chronic anticoagulation who??presents to [facility] with Cystic fibrosis with pulmonary exacerbation (CMS-HCC).  ??  CF with Acute??Bronchiectasis Exacerbation  Most recent PFTs 02/21/17 showed FEV1 74% which was at baseline if not better than previous. History of M. Abscessus, PsA, Steno and MRSA (by self report only). Pulm: Epifanio Lesches.   - Cultures  ????????????????????????- M abscessus 7/12, Smooth PsA 4/9 (Cipro, Levo, Tobra S) 12/17, Steno at [facility]  ????????????????????????- CF sputum -->??PsA   ????????????????????????- AFB culture/smear??ordered -->??no AFB on smear   - Abx  ????????????????????????- Ceftax (8/6 - ?), s/p Tobra (8/6-8/7)  ????????????????????????- Amikacin (8/7 - ?)  ????????????????????????- Home Azithro 500 daily, Clofazamine  - HTS nebs 7%, Albuterol q6, Teza/iva continued  - Airway clearance (home chest vest), adding further clearance as per Respiratory  - Hold Pulmozyme given hemoptysis at this time, Holding home inhaled Amikacin ??  - Chest CT w.o contrast: cystic fibrosis including severe scarring and volume loss with bronchiectasis involving the right upper lobe; bilateral lower lobes compatible with infection.  -V/Q scan Monday: pending  ??  Acute on Chronic Sinusitis: Prescribed Augmentin on 8/2 by PCP. CT sinus in May 2018 showed sequelae of previous sinus surgery and bilateral mucosal thickening.  - Home flonase, zyrtec, singulair   - bicarb/sodium chloride (700/2300mg ) irrigation. Pt may bring from home- nonformulary   ??  Pancreatic insufficiency 2/2 CF:??Wt 253lb (baseline 255-265)  - Home Creon 7 caps with meals, 5 with snacks (home Creaon which has 36,000 lipase per pill, re-ordered this for meals if he has, but could not re-order with snacks so ordered the 24,000 lipase pill for snacks, will need to convert meal time to appropriate 11 caps with meals)  - Dietician:   ????????????????????????- fecal elastase: pending  ????????????????????????- MVW Complete Formulation gel cap 2 daily??  ????????????????????????- check des-gama-carboxy-PT (DCP): wnl  ??  History of DVTs:??Per history may be both provoked and unprovoked, on chronic Xarelto 20mg  at home. Has not taken Xarelto since mid last week 2/2 inability to fill prescription  - Hold xarelto in setting of hemoptysis for now, will reassess for ongoing hemoptysis.  - Will do Heparin SQ at DVT ppx dosing; along with not restarting xarelto in light of possible surgery   ??  HTN:??140/99 on presentation   - Home Lisinopril, Metoprolol   - PRN hydralazine  ??  Restless legs:  - Continue home mirapex 0.25 mg BID  - Continue home Lyrica 100 mg by mouth twice daily  Depression and history of SI:??Doing well, denies SI at this time  - Continue home lamictal and vybrid   ??  Code Status:  Full Code  ___________________________________________________________________    Subjective:  Overnight patient had an episode of chest pain along with some tachy. he has had persistant tachy throughout his hospital stay. An ekg was done and was shown to be wnl. The patient feels that this pain coincides with his infection and does not think very much of it.  Otherwise patient feels the same as he has been very very mild imporvement.     Labs/Studies:  Labs and Studies from the last 24hrs per EMR and Reviewed    Objective:  Temp:  [36.3 ??C-37.2 ??C] 36.3 ??C  Heart Rate:  [95-114] 95  Resp:  [18-20] 18  BP: (133-145)/(66-83) 133/70  SpO2:  [94 %-98 %] 94 %,   Intake/Output Summary (Last 24 hours) at 03/24/17 0756  Last data filed at 03/23/17 2200   Gross per 24 hour   Intake              200 ml   Output                0 ml   Net              200 ml       General Appearance: Well appearing. In no acute distress. Overweight.  HEENT: Head is atraumatic and normocephalic. Sclera anicteric without injection. Oropharyngeal membranes are moist with no erythema or exudate.  Neck: Supple.  Lungs: Bilateral lower lobes coarser breath sounds. Upper lobes breath sounds quite good. Similar exam to previous days.   Heart: Regular rate and rhythm. No murmurs, rubs, or gallops.  Abdomen: Soft, nontender, nondistended.  Extremities: clubbing. No cyanosis, or edema.    Helyn App, MD  PGY-1 Resident

## 2017-03-24 NOTE — Unmapped (Signed)
Problem: Patient Care Overview  Goal: Plan of Care Review  Outcome: Progressing   03/24/17 0529 03/24/17 1524   OTHER   Plan of Care Reviewed With --  patient   Plan of Care Review   Progress improving --      Goal: Individualization and Mutuality  Outcome: Progressing   03/21/17 1619 03/24/17 1524   OTHER   What Anxieties, Fears, Concerns, or Questions Do You Have About Your Care? --  Anxious about going under for surgery   How to Address Anxieties/Fears --  Reassurance about current lung fxn, communication w./ other cf patient with similar experience.    Individualization   Patient Specific Goals (Include Timeframe) To engage in airway clearence despite discomfort and pain --    Patient Specific Interventions Offer pain medication and encourage airway clearence and walkiing. --      Goal: Discharge Needs Assessment  Outcome: Progressing   03/19/17 1720   OTHER   Equipment Currently Used at Home respiratory supplies   Discharge Needs Assessment   Concerns to be Addressed no discharge needs identified     Goal: Interprofessional Rounds/Family Conf  Outcome: Progressing   03/24/17 1524   Interdisciplinary Rounds/Family Conf   Participants patient;physician;nursing       Problem: Cystic Fibrosis (Adult)  Intervention: Monitor Stool Output/Characteristics   03/21/17 1619   Interventions   Bowel Dysfunction Management hygiene measures promoted;relaxation techniques promoted;sitting position facilitated   Medication Review/Management medications reviewed;high risk medications identified     Intervention: Maximize Oxygenation/Ventilation/Perfusion   03/21/17 1619 03/22/17 1900   Interventions   Head of Bed (HOB) --  HOB elevated   Airway/Ventilation Management calming measures promoted;pulmonary hygiene promoted;airway patency maintained --      Intervention: Promote Aggressive Pulmonary Hygiene and Secretion Management   03/19/17 2000   Interventions   Cough And Deep Breathing done independently per patient   Breathing Techniques/Airway Clearance deep/controlled cough encouraged     Intervention: Manage Suspected/Actual Infection   03/24/17 1400   Interventions   Isolation Precautions contact precautions maintained     Intervention: Optimize Nutritional Intake   03/22/17 0800   Interventions   Oral Nutrition Promotion calorie dense foods provided;calorie dense liquids provided     Intervention: Support/Optimize Psychosocial Response to Condition   03/21/17 1619   Interventions   Supportive Measures active listening utilized;positive reinforcement provided;self-reflection promoted;self-responsibility promoted;self-care encouraged       Goal: Signs and Symptoms of Listed Potential Problems Will be Absent, Minimized or Managed (Cystic Fibrosis)  Signs and symptoms of listed potential problems will be absent, minimized or managed by discharge/transition of care (reference Cystic Fibrosis (Adult) CPG).   Outcome: Progressing   03/24/17 1524   Cystic Fibrosis (Adult)   Problems Assessed (Cystic Fibrosis) all   Problems Present (Cystic Fibrosis) pulmonary/respiratory complications;undernutrition/vitamin and mineral deficiency       Problem: VTE, DVT and PE (Adult)  Intervention: Prevent/Manage Thrombi/Emboli   03/21/17 1619 03/23/17 2000   Interventions   VTE Prevention/Management --  ambulation promoted;anticoagulation therapy   Bleeding Precautions blood pressure closely monitored;coagulation study results reviewed --      Intervention: Monitor/Manage Pain   03/21/17 1619   Interventions   Medication Review/Management medications reviewed;high risk medications identified   Pain Management Interventions care clustered;diversional activity provided;medication offered but refused;pain management plan reviewed with patient/caregiver;pillow support provided;quiet environment facilitated       Goal: Signs and Symptoms of Listed Potential Problems Will be Absent, Minimized or Managed (VTE, DVT and PE)  Signs and symptoms of listed potential problems will be absent, minimized or managed by discharge/transition of care (reference VTE, DVT and PE (Adult) CPG).   Outcome: Progressing   03/22/17 1809   VTE, DVT and PE (Adult)   Problems Assessed (VTE, DVT, PE) all   Problems Present (VTE, DVT, PE) none       Comments: Pt alert and oriented. Reports sleeping poorly and slept until about 1000, starting day and medications later. Pt ambulated in hallway x2, seemed positive though slightly anxious about surgery-- VQ scan scheduled for Monday. Pt received tylenol for chronic pleuritic pain. Pt port dressing was coming up and pt requested needle be removed 1 day early so he could have time for shower and being free of line. Site is dry intact and not inflamed. Will re access after pt showers. Will continue with plan of care.

## 2017-03-24 NOTE — Unmapped (Signed)
Problem: Patient Care Overview  Goal: Plan of Care Review  Outcome: Progressing   03/23/17 0201 03/23/17 1716   OTHER   Plan of Care Reviewed With patient --    Plan of Care Review   Progress --  improving     Goal: Individualization and Mutuality  Outcome: Progressing   03/21/17 1619   Individualization   Patient Specific Goals (Include Timeframe) To engage in airway clearence despite discomfort and pain   Patient Specific Interventions Offer pain medication and encourage airway clearence and walkiing.     Goal: Discharge Needs Assessment  Outcome: Progressing   03/19/17 1720   OTHER   Equipment Currently Used at Home respiratory supplies   Discharge Needs Assessment   Concerns to be Addressed no discharge needs identified     Goal: Interprofessional Rounds/Family Conf  Outcome: Progressing   03/21/17 1619   Interdisciplinary Rounds/Family Conf   Participants nursing;patient       Problem: Cystic Fibrosis (Adult)  Intervention: Monitor Stool Output/Characteristics   03/21/17 1619   Interventions   Bowel Dysfunction Management hygiene measures promoted;relaxation techniques promoted;sitting position facilitated   Medication Review/Management medications reviewed;high risk medications identified     Intervention: Maximize Oxygenation/Ventilation/Perfusion   03/21/17 1619 03/22/17 1900   Interventions   Head of Bed (HOB) --  HOB elevated   Airway/Ventilation Management calming measures promoted;pulmonary hygiene promoted;airway patency maintained --      Intervention: Promote Aggressive Pulmonary Hygiene and Secretion Management   03/19/17 2000   Interventions   Breathing Techniques/Airway Clearance deep/controlled cough encouraged     Intervention: Manage Suspected/Actual Infection   03/23/17 1600   Interventions   Isolation Precautions contact precautions maintained     Intervention: Optimize Nutritional Intake   03/22/17 0800   Interventions   Oral Nutrition Promotion calorie dense foods provided;calorie dense liquids provided     Intervention: Support/Optimize Psychosocial Response to Condition   03/21/17 1619   Interventions   Supportive Measures active listening utilized;positive reinforcement provided;self-reflection promoted;self-responsibility promoted;self-care encouraged       Goal: Signs and Symptoms of Listed Potential Problems Will be Absent, Minimized or Managed (Cystic Fibrosis)  Signs and symptoms of listed potential problems will be absent, minimized or managed by discharge/transition of care (reference Cystic Fibrosis (Adult) CPG).   Outcome: Progressing   03/21/17 1059 03/21/17 1619   Cystic Fibrosis (Adult)   Problems Assessed (Cystic Fibrosis) --  malabsorption/maldigestion;pulmonary/respiratory complications;undernutrition/vitamin and mineral deficiency   Problems Present (Cystic Fibrosis) other (see comments) --       03/21/17 1619 03/23/17 1716   Cystic Fibrosis (Adult)   Problems Assessed (Cystic Fibrosis) malabsorption/maldigestion;pulmonary/respiratory complications;undernutrition/vitamin and mineral deficiency --    Problems Present (Cystic Fibrosis) --  infection;pulmonary/respiratory complications       Problem: VTE, DVT and PE (Adult)  Intervention: Prevent/Manage Thrombi/Emboli   03/21/17 1619 03/22/17 0100   Interventions   VTE Prevention/Management --  anticoagulation therapy   Bleeding Precautions blood pressure closely monitored;coagulation study results reviewed --      Intervention: Monitor/Manage Pain   03/21/17 1619   Interventions   Medication Review/Management medications reviewed;high risk medications identified   Pain Management Interventions care clustered;diversional activity provided;medication offered but refused;pain management plan reviewed with patient/caregiver;pillow support provided;quiet environment facilitated       Goal: Signs and Symptoms of Listed Potential Problems Will be Absent, Minimized or Managed (VTE, DVT and PE)  Signs and symptoms of listed potential problems will be absent, minimized or managed by discharge/transition of care (reference VTE,  DVT and PE (Adult) CPG).   Outcome: Progressing   03/22/17 1809   VTE, DVT and PE (Adult)   Problems Assessed (VTE, DVT, PE) all   Problems Present (VTE, DVT, PE) none       Comments: Pt alert and oriented, ambulated in hallway and said he felt dizzy and had some pleuritic chain. Ambulated after lunch and did not report same symptoms. Oxy seemed relieve some pain. VItals stable. Will continue with plan of care.

## 2017-03-24 NOTE — Unmapped (Signed)
Problem: Patient Care Overview  Goal: Plan of Care Review  Outcome: Progressing  Patient reported new onset chest pain on left side md notified and at bedside promptly. Pain does not radiate. Vs remain wnl with increased hr at baseline.   Prn medication given ecg completed.  Patient resting at next bed check.  Has slept through the later half of the night with no distress noted.  Will continue to monitor.   Goal: Individualization and Mutuality  Outcome: Progressing    Goal: Discharge Needs Assessment  Outcome: Progressing    Goal: Interprofessional Rounds/Family Conf  Outcome: Progressing      Problem: Cystic Fibrosis (Adult)  Goal: Signs and Symptoms of Listed Potential Problems Will be Absent, Minimized or Managed (Cystic Fibrosis)  Signs and symptoms of listed potential problems will be absent, minimized or managed by discharge/transition of care (reference Cystic Fibrosis (Adult) CPG).   Outcome: Progressing

## 2017-03-24 NOTE — Unmapped (Signed)
Problem: Cystic Fibrosis (Adult)  Goal: Signs and Symptoms of Listed Potential Problems Will be Absent, Minimized or Managed (Cystic Fibrosis)  Signs and symptoms of listed potential problems will be absent, minimized or managed by discharge/transition of care (reference Cystic Fibrosis (Adult) CPG).   Outcome: Progressing  Patient was not able to tolerated all the treatment due to chest pain, patient was unable to do his Symbicort treatment this shift, will continue to monitor

## 2017-03-24 NOTE — Unmapped (Signed)
Problem: Cystic Fibrosis (Adult)  Goal: Signs and Symptoms of Listed Potential Problems Will be Absent, Minimized or Managed (Cystic Fibrosis)  Signs and symptoms of listed potential problems will be absent, minimized or managed by discharge/transition of care (reference Cystic Fibrosis (Adult) CPG).   Patient compliant with all scheduled inhaled treatments.  Patient compliant with airway clearance via [place].  Patient remains on room air.  No cough noted.  No signs of distress noted.  No concerns to note at this time.  Will continue to monitor.

## 2017-03-25 LAB — CBC
HEMATOCRIT: 35.8 % — ABNORMAL LOW (ref 41.0–53.0)
HEMOGLOBIN: 11.8 g/dL — ABNORMAL LOW (ref 13.5–17.5)
MEAN CORPUSCULAR HEMOGLOBIN CONC: 32.9 g/dL (ref 31.0–37.0)
MEAN CORPUSCULAR VOLUME: 80.9 fL (ref 80.0–100.0)
MEAN PLATELET VOLUME: 7.5 fL (ref 7.0–10.0)
PLATELET COUNT: 291 10*9/L (ref 150–440)
RED BLOOD CELL COUNT: 4.42 10*12/L — ABNORMAL LOW (ref 4.50–5.90)
RED CELL DISTRIBUTION WIDTH: 15.4 % — ABNORMAL HIGH (ref 12.0–15.0)
WBC ADJUSTED: 6.9 10*9/L (ref 4.5–11.0)

## 2017-03-25 LAB — BASIC METABOLIC PANEL
ANION GAP: 9 mmol/L (ref 9–15)
BLOOD UREA NITROGEN: 13 mg/dL (ref 7–21)
CALCIUM: 8.7 mg/dL (ref 8.5–10.2)
CHLORIDE: 99 mmol/L (ref 98–107)
CO2: 27 mmol/L (ref 22.0–30.0)
EGFR MDRD AF AMER: >=60 mL/min/{1.73_m2} (ref >=60)
EGFR MDRD NON AF AMER: >=60 mL/min/{1.73_m2} (ref >=60)
POTASSIUM: 4.5 mmol/L (ref 3.5–5.0)
SODIUM: 135 mmol/L (ref 135–145)

## 2017-03-25 LAB — MAGNESIUM: Magnesium:MCnc:Pt:Ser/Plas:Qn:: 1.7

## 2017-03-25 LAB — CREATININE: Creatinine:MCnc:Pt:Ser/Plas:Qn:: 0.84

## 2017-03-25 LAB — HEMATOCRIT: Lab: 35.8 — ABNORMAL LOW

## 2017-03-25 NOTE — Unmapped (Signed)
Aminoglycoside Follow-Up Pharmacy Note    Christian Young is a 31 y.o. male on amikacin 860 mg IV  every 24 hours for CF exacerbation and M. abscessus.  Currently on day 5 of therapy.    CF extended infusion (1 hour):  Goal peak: 35-45 mg/L   Goal trough: <1 mg/L for 6-8 hour drug free interval    Wt Readings from Last 3 Encounters:   03/21/17 (!) 122.1 kg (269 lb 1.6 oz)   02/21/17 (!) 115 kg (253 lb 9.6 oz)   02/15/17 (!) 118.8 kg (261 lb 14.5 oz)     Results for [name], [name] (MRN [id number]) as of 03/24/2017 17:27   Ref. Range 03/20/2017 05:20 03/21/2017 05:19 03/22/2017 05:32   Bun Latest Ref Range: 7 - 21 mg/dL 14 16 15    Creatinine Latest Ref Range: 0.70 - 1.30 mg/dL 8.11 9.14 7.82       Measured peak = 20.8 mg/L  Calculated peak = 31 mg/L    Measured trough/random = 2.6 mg/L  Calculated trough = 0.02 mg/L    Patient-Specific Pharmacokinetic Parameters:  Vd = 23.8 L, ke = 0.32 hr-1    Based on levels, recommend increase to 880 mg q24h.    Pharmacy will continue to monitor for changes in renal function, toxicity, and efficacy and order additional levels as needed. Please page service pharmacist with questions/clarifications.    Zenovia Jarred,  PharmD   PGY-1 Pharmacy Resident

## 2017-03-25 NOTE — Unmapped (Signed)
Problem: Patient Care Overview  Goal: Plan of Care Review  Outcome: Progressing  Pt VSS and afebrile this shift. Medications and antibiotics given as ordered. Pt c/o pleauritic chest pain once this shift. Prn oxycodone 5 mg given with some relief. Pt completed airway clearance with RT. No s/sx of respiratory distress noted on room air. Pt has pending VQ scan for possible surgery. No injury noted. Pt A&Ox4. Balanced and steady gait. Good appetite noted. 2 trays removed. No n/v noted. Plan of care continued.   03/25/17 0434   OTHER   Plan of Care Reviewed With patient   Plan of Care Review   Progress improving     Goal: Individualization and Mutuality   03/19/17 0114 03/21/17 1619   Individualization   Patient Specific Preferences Call me [name] --    Patient Specific Interventions --  Offer pain medication and encourage airway clearence and walkiing.     Goal: Discharge Needs Assessment  Outcome: Progressing   03/19/17 0114 03/19/17 1254 03/19/17 1720   OTHER   Equipment Currently Used at Home --  --  respiratory supplies   Patient and/or family were provided with choice of facilities / services that are available and appropriate to meet post hospital care needs? --  --  --    Discharge Needs Assessment   Concerns to be Addressed --  --  --    Readmission Within the Last 30 Days --  previous discharge plan unsuccessful --    Patient/Family Anticipates Transition to --  --  home   Patient/Family Anticipated Services at Transition --  --  none   Transportation Anticipated family or friend will provide --  --    Anticipated Changes Related to Illness --  none --    Equipment Needed After Discharge --  --  --    Discharge Facility/Level of Care Needs --  (home to self care) --    Current Discharge Risk chronically ill;psychiatric illness --  --     03/25/17 0434   OTHER   Equipment Currently Used at Home --    Patient and/or family were provided with choice of facilities / services that are available and appropriate to meet post hospital care needs? Yes   Discharge Needs Assessment   Concerns to be Addressed no discharge needs identified   Readmission Within the Last 30 Days --    Patient/Family Anticipates Transition to --    Patient/Family Anticipated Services at Transition --    Transportation Anticipated --    Anticipated Changes Related to Illness --    Equipment Needed After Discharge respiratory supplies   Discharge Facility/Level of Care Needs --    Current Discharge Risk --        Problem: Cystic Fibrosis (Adult)  Intervention: Promote Aggressive Pulmonary Hygiene and Secretion Management   03/19/17 2000   Interventions   Cough And Deep Breathing done independently per patient   Breathing Techniques/Airway Clearance deep/controlled cough encouraged     Intervention: Manage Suspected/Actual Infection   03/21/17 2000 03/25/17 0400 03/25/17 0434   Interventions   Fever Reduction/Comfort Measures --  --  lightweight bedding;lightweight clothing   Isolation Precautions --  contact precautions maintained --    Infection Management aseptic technique maintained --  --        Goal: Signs and Symptoms of Listed Potential Problems Will be Absent, Minimized or Managed (Cystic Fibrosis)  Signs and symptoms of listed potential problems will be absent, minimized or managed by discharge/transition of care (  reference Cystic Fibrosis (Adult) CPG).   Outcome: Progressing   03/25/17 0434   Cystic Fibrosis (Adult)   Problems Assessed (Cystic Fibrosis) all   Problems Present (Cystic Fibrosis) infection;pulmonary/respiratory complications       Problem: VTE, DVT and PE (Adult)  Intervention: Monitor/Manage Pain   03/25/17 0434   Interventions   Medication Review/Management medications reviewed;high risk medications identified   Pain Management Interventions pain management plan reviewed with patient/caregiver;care clustered       Goal: Signs and Symptoms of Listed Potential Problems Will be Absent, Minimized or Managed (VTE, DVT and PE)  Signs and symptoms of listed potential problems will be absent, minimized or managed by discharge/transition of care (reference VTE, DVT and PE (Adult) CPG).   Outcome: Progressing   03/25/17 0434   VTE, DVT and PE (Adult)   Problems Assessed (VTE, DVT, PE) all   Problems Present (VTE, DVT, PE) none

## 2017-03-25 NOTE — Unmapped (Signed)
Medicine Daily Progress Note    Assessment/Plan:    Principal Problem:    Cystic fibrosis with pulmonary exacerbation (CMS-HCC)  Active Problems:    Essential hypertension    History of DVT (deep vein thrombosis)    Chronic pansinusitis    Pancreatic insufficiency    Mycobacterium abscessus infection  Resolved Problems:    * No resolved hospital problems. *      Darroll Bredeson Fortunato??is a 31 y.o.??y/o male??with PMHx of CF with pancreatic insufficiency, CF sinus disease, HTN, depression, history of DVTs on chronic anticoagulation who??presents to [facility] with Cystic fibrosis with pulmonary exacerbation (CMS-HCC).  ??  CF with Acute??Bronchiectasis Exacerbation  Most recent PFTs 02/21/17 showed FEV1 74% which was at baseline if not better than previous. History of M. Abscessus, PsA, Steno and MRSA (by self report only). Pulm: Epifanio Lesches.   - Cultures  ????????????????????????- M abscessus 7/12, Smooth PsA 4/9 (Cipro, Levo, Tobra S) 12/17, Steno at [facility]  ????????????????????????- CF sputum -->??PsA   ????????????????????????- AFB culture/smear??ordered -->??no AFB on smear   - Abx  ????????????????????????- Ceftax (8/6 - ?), s/p Tobra (8/6-8/7)  ????????????????????????- Amikacin (8/7 - ?)  ????????????????????????- Home Azithro 500 daily, Clofazamine  - HTS nebs 7%, Albuterol q6, Teza/iva continued  - Airway clearance (home chest vest), adding further clearance as per Respiratory  - Hold Pulmozyme given hemoptysis at this time, Holding home inhaled Amikacin ??  - Chest CT w.o contrast: cystic fibrosis including severe scarring and volume loss with bronchiectasis involving the right upper lobe; bilateral lower lobes compatible with infection.  -V/Q scan 8/13: Decreased perfusion of the right upper lung zone (4.82% as compared to 16% on left).   - Surgery will see patient 8/13 to discuss options: pending  ??  Acute on Chronic Sinusitis: Prescribed Augmentin on 8/2 by PCP. CT sinus in May 2018 showed sequelae of previous sinus surgery and bilateral mucosal thickening.  - Home flonase, zyrtec, singulair - bicarb/sodium chloride (700/2300mg ) irrigation. Pt may bring from home- nonformulary   ??  Pancreatic insufficiency 2/2 CF:??Wt 253lb (baseline 255-265)  - Home Creon 7 caps with meals, 5 with snacks (home Creaon which has 36,000 lipase per pill, re-ordered this for meals if he has, but could not re-order with snacks so ordered the 24,000 lipase pill for snacks, will need to convert meal time to appropriate 11 caps with meals)  - Dietician:   ????????????????????????- fecal elastase: pending  ????????????????????????- MVW Complete Formulation gel cap 2 daily??  ????????????????????????- check des-gama-carboxy-PT (DCP): wnl  ??  History of DVTs:??Per history may be both provoked and unprovoked, on chronic Xarelto 20mg  at home. Has not taken Xarelto since mid last week 2/2 inability to fill prescription  - Hold xarelto in setting of hemoptysis for now, will reassess for ongoing hemoptysis.  - Will do Heparin SQ at DVT ppx dosing; along with not restarting xarelto in light of possible surgery   ??  HTN:??140/99 on presentation   - Home Lisinopril, Metoprolol   - PRN hydralazine  ??  Restless legs:  - Continue home mirapex 0.25 mg BID  - Continue home Lyrica 100 mg by mouth twice daily  Depression and history of SI:??Doing well, denies SI at this time  - Continue home lamictal and vybrid   ??  Code Status:  Full Code    ___________________________________________________________________    Subjective:  No acute events overnight, Tolerating diet without difficulty. Pt is a bit nervous to speak with surgery; however he would like to know  what options he may have. Pt was going to V/Q scan this am     Labs/Studies:  Labs and Studies from the last 24hrs per EMR and Reviewed    Objective:  Temp:  [36.1 ??C-36.9 ??C] 36.9 ??C  Heart Rate:  [96-113] 113  Resp:  [18] 18  BP: (152-162)/(72-79) 162/72  SpO2:  [94 %-98 %] 94 %,   Intake/Output Summary (Last 24 hours) at 03/25/17 0653  Last data filed at 03/24/17 2213   Gross per 24 hour   Intake              100 ml   Output 0 ml   Net              100 ml       Deferred Physical Exam d/t patient in V/Q scan this morning      Helyn App, MD  PGY-1 Resident

## 2017-03-25 NOTE — Unmapped (Signed)
Patient did well with doing all of their treatments today. Patient did their airway clearance without complications.

## 2017-03-26 LAB — AMIKACIN RANDOM
Amikacin^random:MCnc:Pt:Ser/Plas:Qn:: 11
Amikacin^random:MCnc:Pt:Ser/Plas:Qn:: 3.4

## 2017-03-26 NOTE — Unmapped (Signed)
Cystic Fibrosis Nutrition Assessment    Inpatient: MD Consult this admission and related follow up  Primary Pulmonologist: Dr. Koren Shiver  ===================================================================  Christian Young is a 31 y.o. male seen for medical nutrition therapy. Currently admitted with CF exacerbation for IV antibiotics.  [name] reports he is eating okay. States stools are no longer green in color. Denies s/s of malabsorption. Continues to feel Creon is working well, did not bring Creon 36,000 with him this admit and is not on inpatient drug formulary. Agreeable to continue to use Creon 24,000 during admission.  ===================================================================  INTERVENTION  1. Monitor appetite/intake.     2. During admission continue Creon 24,000 (11 at meals, 8 at snacks).  - Can resume home Creon 36,000 after discharge.    3. Fecal elastase sent, results pending.    4. Considering low vitamin D level - Recommend add 6000 International units vitamin D3 daily. Note while inpatient, CF vitamin + this supplemental vitamin D provides total of 9000 International units vitamin D daily.  - Continue inpatient CF vitamin regimen:  MVW Complete Formulation gel cap 2 daily   - When discharged home resume usual Complete Formulation D5000 softgels, 2 daily. Provides total of 10,000 International units vitamin D daily.  - Defer vitamin K supplementation to MD team given DCP is WNL.    5. Monitor blood glucose levels.    6. Weigh patient twice weekly this admit.    7. Continue remainder of nutrition regimen:  - High Calorie High Protein diet  - acid reducer  - bowel regimen    Inpatient:   Will follow up with patient per protocol: 1-2 times per week (and more frequent as indicated)===================================================================  ASSESSMENT:  Cystic Fibrosis Nutrition Category = Outstanding    Current diet is appropriate for CF. Current PO intake is improving and closer to meeting estimated CF needs. Patient continues to work towards goals for weight management.   Enzyme dose is within established guidelines. Vitamin prescription is not appropriate to reach/maintain optimal fat soluble vitamin levels. Patient would benefit from change in vitamin regimen. Patient is not deficienct in vitamin K as indicated by a normal des-gamma-carboxy-PT (DCP).  DCP is a more sensitive and specific marker for vitamin K than prothrombin time (PT).   Sodium needs for CF met with PO and/or supplement. Bowel regimen is appropriate. Acid reducer appropriate for GERD and enzyme activation.    Malnutrition Assessment using AND/ASPEN Clinical Characteristics:  Patient does not meet AND/ASPEN criteria for malnutrition at this time (03/19/17 1404)    Goals:  1. Meet estimated daily needs: [id number] kcals (24-28 kcals/kg/day); 146-183 gm pro (1.2-1.5 gm pro/kg/day)  2. Reach/maintain established goals for CF:                Adult - BMI 22kg/m2 for CF females and 23kg/m2 for CF males  3. Normal fat-soluble vitamin levels: Vitamin A, Vitamin E and PT per lab range; Vitamin D 25OH total >30  4. Maintain glucose control. Carbohydrate content of diet should comprise 40-50% of total calorie needs, but carbohydrates are not restricted in this population.    5.  Meet sodium needs for CF  ===================================================================  INPATIENT:    Current Nutrition Orders (inpatient):       Nutrition Orders            Start     Ordered    03/19/17 0900  Supplement High Fat Milkshake; # of Products PER Serving: 1 2xd PC  2 times a  day [facility])     Question Answer Comment   Select Supplement High Fat Milkshake    # of Products PER Serving 1        03/18/17 2125    03/18/17 2126  Nutrition Therapy High Calorie High Protein  Effective now     Question:  Nutrition Therapy (T):  Answer:  High Calorie High Protein    03/18/17 2125        CF Nutrition related medications (inpatient): Nutritionally relevant medications reviewed.   Complete Formulation 2 softgels daily  Omeprazole  Creon 24,000 (11 at meals, 8 at snacks) provides 2200 units lipase/kg/meal & 16109 units lipase/kg/day  5mg  vitamin K twice weekly  Miralax BID  Symdeko  ondansetron PRN    CF Nutrition related labs (inpatient):  03-25-17: Glu 131  03-22-17: Glu 145, Glu-POC 201  03-19-17: Glu 109, Glu-POC 166    Last 5 Recorded Weights    03/18/17 2217 03/19/17 2130 03/21/17 2213 03/24/17 2200   Weight: (!) 120.1 kg (264 lb 12.4 oz) (!) 119.3 kg (263 lb) (!) 122.1 kg (269 lb 1.6 oz) (!) 121.3 kg (267 lb 8 oz)   ==================================================================  CLINICAL DATA:  Past Medical History:   Diagnosis Date   ??? Anxiety    ??? Chronic pain disorder    ??? Cystic fibrosis (CMS-HCC)    ??? Depression    ??? Hypertension    Per review of Care Everywhere PMHx also includes: CFRD, PI, essential HTN    Anthroprometric Evaluation:  Weight changes: Weight up.  Per review of Care Everywhere: March 2018 weight 262 pounds  BMI Readings from Last 1 Encounters:   03/24/17 36.28 kg/m??     Wt Readings from Last 3 Encounters:   03/24/17 (!) 121.3 kg (267 lb 8 oz)   02/21/17 (!) 115 kg (253 lb 9.6 oz)   02/15/17 (!) 118.8 kg (261 lb 14.5 oz)     Ht Readings from Last 3 Encounters:   03/18/17 182.9 cm (6')   02/05/17 180.3 cm (5' 11)   01/10/17 180.3 cm (5' 11)   ==================================================================  Energy Intake (outpatient):  Diet: High in calories, fat, salt. Diet evaluated this visit. Reports appetite is down with current illness, eating 1-2 meals daily PTA.  Food allergies: Per review of Care Everywhere note banana is listed as allergy.  Diet and CFTR modulators: Prescribed Symdeko (tezacaftor/ivacaftor).    PO Supplements: none  Appetite Stimulant: none  Enteral feeding tube: n/a  Sodium in diet: Adequate from diet  Calcium in diet:  Adequate from diet    Fat Malabsorption (outpatient):  Enzyme brand, (meals/snacks):   Creon 36,000 (7 at meals, 5 at snacks)   - Switched from Zenpep to Creon due to report of bloating & greasy stools when on Zenpep.   Enzyme administration details: correct pre-meal administration., moderate compliance, swallows capsules whole  Enzyme dose per MEAL (units lipase/kg/meal) 2100  Enzyme dose per DAY (units lipase/kg/day) 10800  Stools: 1-2 daily, denies oily/greasy appearance. Reports green colored stools continue, reported to RD 03-12-17 as outpatient.  Abdominal pain: None reported  Fecal Fat Studies:  No results found for: ELAST  GI meds: Nutritionally relevant medications reviewed. MVW probiotic, omeprazole.    Vitamins/Minerals (outpatient):  CF-specific MVI, dose, compliance: MVW Complete Formulation Softgel D-5000 2 daily  Other vitamins/minerals/herbals: none  Calcium supplement: none  Patient Resources: -[employer] (phone [phone] [email or web address])   - Orders CF vitamin and probiotic directly from manufacturer (MVW), cost billed directly to [employer]  grant  - He is thinks he may also be enrolled in [employer] but he is unsure at this time.  Fat-soluble vitamin levels:  Lab Results   Component Value Date/Time    VITAMINA 44.4 11/19/2016 0647     Lab Results   Component Value Date/Time    VITDTOTAL 27.9 03/19/2017 0508        Lab Results   Component Value Date/Time    PT 14.1 (H) 11/29/2016 0807    PT 15.3 (H) 11/26/2016 0529    PT 16.1 (H) 11/23/2016 0402    PT 18.1 (H) 11/20/2016 0530    PT 20.5 (H) 11/19/2016 1610   03-20-17: normal DesCarboxyPT (also known as PIVKA) indicates no vitamin K deficiency.      Bone Health: unknown  CF Related Diabetes: Yes, Per Care EveryWhere has been on 5 units levemir at bedtime in the past. Also noted per Care Everywhere he was supposed to have had Endo appointment at outside facility in March 2018 - unclear if he made it to this appointment.        Lab Results   Component Value Date/Time    A1C 6.1 (H) 11/19/2016 9604

## 2017-03-26 NOTE — Unmapped (Signed)
Problem: Patient Care Overview  Goal: Plan of Care Review  Outcome: Progressing  Patient has been alert and oriented x4. Patient has denied pain. Patient has remained on room air and has denied shortness of breath. Patient tolerated diet without any issues to note. Patient ambulating in room without any issues to note. Continue with plan of care.    Problem: Cystic Fibrosis (Adult)  Goal: Signs and Symptoms of Listed Potential Problems Will be Absent, Minimized or Managed (Cystic Fibrosis)  Signs and symptoms of listed potential problems will be absent, minimized or managed by discharge/transition of care (reference Cystic Fibrosis (Adult) CPG).   Outcome: Progressing      Problem: VTE, DVT and PE (Adult)  Goal: Signs and Symptoms of Listed Potential Problems Will be Absent, Minimized or Managed (VTE, DVT and PE)  Signs and symptoms of listed potential problems will be absent, minimized or managed by discharge/transition of care (reference VTE, DVT and PE (Adult) CPG).   Outcome: Progressing

## 2017-03-26 NOTE — Unmapped (Signed)
Patient did well with doing all of their treatments today. Patient did their airway clearance without complications.

## 2017-03-26 NOTE — Unmapped (Signed)
Medicine Daily Progress Note    Assessment/Plan:    Principal Problem:    Cystic fibrosis with pulmonary exacerbation (CMS-HCC)  Active Problems:    Essential hypertension    History of DVT (deep vein thrombosis)    Chronic pansinusitis    Pancreatic insufficiency    Mycobacterium abscessus infection  Resolved Problems:    * No resolved hospital problems. *    Christian Young??is a 31 y.o.??y/o male??with PMHx of late diagnosis CF [phone] mutation), pancreatic insufficiency, CF sinus disease, HTN, depression, and DVTs on chronic anticoagulation who??presents to [facility] with Cystic fibrosis with pulmonary exacerbation (CMS-HCC).  ??  CF with Acute??Bronchiectasis Exacerbation  Most recent PFTs 02/21/17 showed FEV1 74% which was at baseline if not better than previous. History of M. Abscessus, PsA, Steno and MRSA (by self report only). Pulm: Epifanio Lesches.   - Cultures  ????????????????????????- M abscessus 7/12, Smooth PsA 4/9 (Cipro, Levo, Tobra S) 12/17, Steno at [facility]  ????????????????????????- CF sputum -->??PsA   ????????????????????????- AFB culture/smear??ordered -->??no AFB on smear   - Abx  ????????????????????????- Ceftax (8/6 - ?), s/p Tobra (8/6-8/7)  ????????????????????????- Amikacin (8/7 - ?)  ????????????????????????- Home Azithro 500 daily, Clofazamine  - HTS nebs 7%, Albuterol q6, Teza/iva continued  - Airway clearance (home chest vest), adding further clearance as per Respiratory  - Chest CT w.o contrast: cystic fibrosis including severe scarring and volume loss with bronchiectasis involving the right upper lobe; bilateral lower lobes compatible with infection.  -V/Q scan 8/13: Decreased perfusion of the right upper lung zone (4.82% as compared to 16% on left).   - Surgery will see patient to discuss options: pending  ??  Acute on Chronic Sinusitis: Prescribed Augmentin on 8/2 by PCP. CT sinus in May 2018 showed sequelae of previous sinus surgery and bilateral mucosal thickening.  - Home flonase, zyrtec, singulair   - bicarb/sodium chloride (700/2300mg ) irrigation. Pt may bring from home- nonformulary   ??  Pancreatic insufficiency 2/2 CF:??Wt 253lb (baseline 255-265)  - Home Creon 7 caps with meals, 5 with snacks (home Creaon which has 36,000 lipase per pill, re-ordered this for meals if he has, but could not re-order with snacks so ordered the 24,000 lipase pill for snacks, will need to convert meal time to appropriate 11 caps with meals)  - Dietician:   ????????????????????????- fecal elastase: pending  ????????????????????????- MVW Complete Formulation gel cap 2 daily??  ????????????????????????- check des-gama-carboxy-PT (DCP): wnl  ??  History of DVTs:??Per history may be both provoked and unprovoked, on chronic Xarelto 20mg  at home. Has not taken Xarelto since mid last week 2/2 inability to fill prescription. Held xarelto in setting of hemoptysis, changed to Heparin SQ on 8/6 at DVT ppx dosing along with not restarting xarelto in light of possible surgery.  - Continue heparin  ??  HTN:??Patient on home Lisinopril 10mg  daily and Metoprolol succinate 25mg  daily. BPs running into 140150 s/80s-90s in the afternoon. Also has baseline tachycardia in 90s-115.   - Change metoprolol succinate for Coreg 3.125mg  po BID  - PRN hydralazine  ??  Restless legs:  - Continue home mirapex 0.25 mg BID  - Continue home Lyrica 100 mg by mouth twice daily    Depression and history of SI:??Doing well, denies SI at this time  - Continue home lamictal and vybrid   ??  Code Status:  Full Code    ___________________________________________________________________    Subjective:  No acute events overnight, Tolerating diet without difficulty. Pt is nervous about dying in surgery.  States he talked with cardiothoracic surgery yesterday who explained the procedure and will touch base on scheduling.     Labs/Studies:  Labs and Studies from the last 24hrs per EMR and Reviewed    Objective:  Temp:  [36.4 ??C-37.1 ??C] 36.7 ??C  Heart Rate:  [100-115] 115  Resp:  [18] 18  BP: (143-155)/(81-93) 143/81  SpO2:  [97 %-98 %] 98 %,     Intake/Output Summary (Last 24 hours) at 03/26/17 1145  Last data filed at 03/26/17 0900   Gross per 24 hour   Intake           978.52 ml   Output                0 ml   Net           978.52 ml     Physical Exam   Constitutional: He is well-developed, well-nourished, and in no distress.   Neck: Neck supple.   Cardiovascular: Regular rhythm.    No murmur heard.  tachycardic   Pulmonary/Chest: Effort normal. No respiratory distress.   Mild coarse breath sounds throughout   Abdominal: Soft. He exhibits no distension. There is no tenderness.   Neurological: He is alert.   Psychiatric: Affect normal.     Christian Young T. [name] DO  Physical Medicine & Rehabilitation - PGY1

## 2017-03-26 NOTE — Unmapped (Signed)
Problem: Cystic Fibrosis (Adult)  Goal: Signs and Symptoms of Listed Potential Problems Will be Absent, Minimized or Managed (Cystic Fibrosis)  Signs and symptoms of listed potential problems will be absent, minimized or managed by discharge/transition of care (reference Cystic Fibrosis (Adult) CPG).   Outcome: Progressing  Patient did well with doing all his scheduled breathing treatments and airway clearance without complications. Pt using aerobika for his airway clearance.will continue to monitor.

## 2017-03-26 NOTE — Unmapped (Signed)
Problem: Patient Care Overview  Goal: Plan of Care Review  Outcome: Progressing  Pt remains on RA with stable O2 sats, no resp distress noted. IV abx and airway clearance continued as ordered. Oxycodone continued for pleuritic CP with some relief. Will cont to monitor.    Problem: Cystic Fibrosis (Adult)  Goal: Signs and Symptoms of Listed Potential Problems Will be Absent, Minimized or Managed (Cystic Fibrosis)  Signs and symptoms of listed potential problems will be absent, minimized or managed by discharge/transition of care (reference Cystic Fibrosis (Adult) CPG).   Outcome: Progressing      Problem: VTE, DVT and PE (Adult)  Goal: Signs and Symptoms of Listed Potential Problems Will be Absent, Minimized or Managed (VTE, DVT and PE)  Signs and symptoms of listed potential problems will be absent, minimized or managed by discharge/transition of care (reference VTE, DVT and PE (Adult) CPG).   Outcome: Progressing

## 2017-03-27 LAB — BASIC METABOLIC PANEL
ANION GAP: 14 mmol/L (ref 9–15)
BLOOD UREA NITROGEN: 17 mg/dL (ref 7–21)
BUN / CREAT RATIO: 21
CALCIUM: 9.3 mg/dL (ref 8.5–10.2)
CHLORIDE: 100 mmol/L (ref 98–107)
CO2: 23 mmol/L (ref 22.0–30.0)
CREATININE: 0.82 mg/dL (ref 0.70–1.30)
EGFR MDRD AF AMER: >=60 mL/min/{1.73_m2} (ref >=60)
EGFR MDRD NON AF AMER: >=60 mL/min/{1.73_m2} (ref >=60)
GLUCOSE RANDOM: 156 mg/dL (ref 65–179)
POTASSIUM: 4.8 mmol/L (ref 3.5–5.0)

## 2017-03-27 LAB — MAGNESIUM
MAGNESIUM: 1.7 mg/dL (ref 1.6–2.2)
Magnesium:MCnc:Pt:Ser/Plas:Qn:: 1.7

## 2017-03-27 LAB — CBC
HEMATOCRIT: 39.5 % — ABNORMAL LOW (ref 41.0–53.0)
HEMOGLOBIN: 13 g/dL — ABNORMAL LOW (ref 13.5–17.5)
MEAN CORPUSCULAR HEMOGLOBIN: 26.4 pg (ref 26.0–34.0)
MEAN CORPUSCULAR VOLUME: 80.2 fL (ref 80.0–100.0)
MEAN PLATELET VOLUME: 7.4 fL (ref 7.0–10.0)
PLATELET COUNT: 352 10*9/L (ref 150–440)
RED BLOOD CELL COUNT: 4.92 10*12/L (ref 4.50–5.90)
RED CELL DISTRIBUTION WIDTH: 15.7 % — ABNORMAL HIGH (ref 12.0–15.0)
WBC ADJUSTED: 8 10*9/L (ref 4.5–11.0)

## 2017-03-27 LAB — CREATININE: Creatinine:MCnc:Pt:Ser/Plas:Qn:: 0.82

## 2017-03-27 LAB — WBC ADJUSTED: Lab: 8

## 2017-03-27 NOTE — Unmapped (Signed)
Problem: Cystic Fibrosis (Adult)  Goal: Signs and Symptoms of Listed Potential Problems Will be Absent, Minimized or Managed (Cystic Fibrosis)  Signs and symptoms of listed potential problems will be absent, minimized or managed by discharge/transition of care (reference Cystic Fibrosis (Adult) CPG).   Outcome: Progressing  Patient did well with doing all his scheduled breathing treatments and airway clearance without complications. Pt using aerobika for his airway clearance.

## 2017-03-27 NOTE — Unmapped (Signed)
Problem: Patient Care Overview  Goal: Plan of Care Review  Outcome: Progressing  Pt remains on RA with stable O2 sats, no resp distress noted. IV abx and airway clearance continued as ordered. Oxycodone continued for pleuritic CP with adequate pain management. Will cont to monitor.    Problem: Cystic Fibrosis (Adult)  Goal: Signs and Symptoms of Listed Potential Problems Will be Absent, Minimized or Managed (Cystic Fibrosis)  Signs and symptoms of listed potential problems will be absent, minimized or managed by discharge/transition of care (reference Cystic Fibrosis (Adult) CPG).   Outcome: Progressing      Problem: VTE, DVT and PE (Adult)  Goal: Signs and Symptoms of Listed Potential Problems Will be Absent, Minimized or Managed (VTE, DVT and PE)  Signs and symptoms of listed potential problems will be absent, minimized or managed by discharge/transition of care (reference VTE, DVT and PE (Adult) CPG).   Outcome: Progressing

## 2017-03-27 NOTE — Unmapped (Signed)
Medicine Daily Progress Note    Assessment/Plan:    Principal Problem:    Cystic fibrosis with pulmonary exacerbation (CMS-HCC)  Active Problems:    Essential hypertension    History of DVT (deep vein thrombosis)    Chronic pansinusitis    Pancreatic insufficiency    Mycobacterium abscessus infection  Resolved Problems:    * No resolved hospital problems. *    Christian Tilly Zehren??is a 31 y.o.??y/o Young??with PMHx of late diagnosis CF [phone] mutation), pancreatic insufficiency, CF sinus disease, HTN, depression, and DVTs on chronic anticoagulation who??presents to [facility] with Cystic fibrosis with pulmonary exacerbation (CMS-HCC).  ??  CF with Acute??Bronchiectasis Exacerbation  Most recent PFTs 02/21/17 showed FEV1 74% which was at baseline if not better than previous. History of M. Abscessus, PsA, Steno and MRSA (by self report only). Pulm: Christian Young.   - Cultures  ????????????????????????- M abscessus 7/12, Smooth PsA 4/9 (Cipro, Levo, Tobra S) 12/17, Steno at [facility]  ????????????????????????- CF sputum -->??PsA   ????????????????????????- AFB culture/smear??ordered -->??no AFB on smear   - Abx  ????????????????????????- Ceftax (8/6 - ?), s/p Tobra (8/6-8/7)  ????????????????????????- Amikacin (8/7 - ?)  ????????????????????????- Home Azithro 500 daily, Clofazamine  - HTS nebs 7%, Albuterol q6, Teza/iva continued  - Airway clearance (home chest vest), adding further clearance as per Respiratory  - Chest CT w.o contrast: cystic fibrosis including severe scarring and volume loss with bronchiectasis involving the right upper lobe; bilateral lower lobes compatible with infection.  -V/Q scan 8/13: Decreased perfusion of the right upper lung zone (4.82% as compared to 16% on left).   - Surgery to see patient: pending  ??  Acute on Chronic Sinusitis: Prescribed Augmentin on 8/2 by PCP. CT sinus in May 2018 showed sequelae of previous sinus surgery and bilateral mucosal thickening.  - Home flonase, zyrtec, singulair   - bicarb/sodium chloride (700/2300mg ) irrigation. Pt may bring from home- nonformulary   ??  Pancreatic insufficiency 2/2 CF:??Wt 253lb (baseline 255-265)  - Home Creon 7 caps with meals, 5 with snacks (home Creaon which has 36,000 lipase per pill, re-ordered this for meals if he has, but could not re-order with snacks so ordered the 24,000 lipase pill for snacks, will need to convert meal time to appropriate 11 caps with meals)  - Dietician:   ????????????????????????- fecal elastase: pending  ????????????????????????- MVW Complete Formulation gel cap 2 daily??  ????????????????????????- check des-gama-carboxy-PT (DCP): wnl  ??  History of DVTs:??Per history may be both provoked and unprovoked, on chronic Xarelto 20mg  at home. Has not taken Xarelto since mid last week 2/2 inability to fill prescription. Held xarelto in setting of hemoptysis, changed to Heparin SQ on 8/6 at DVT ppx dosing along with not restarting xarelto in light of possible surgery.  - Continue heparin  ??  HTN:??Patient on home Lisinopril 10mg  daily and Metoprolol succinate 25mg  daily. BPs running into 140150 s/80s-90s in the afternoon. Also has baseline tachycardia in 90s-115.   - Changed metoprolol succinate for Coreg 3.125mg  po BID starting 8/15  - PRN hydralazine  ??  Restless legs:  - Continue home mirapex 0.25 mg BID  - Continue home Lyrica 100 mg by mouth twice daily    Depression and history of SI:??Doing well, denies SI at this time  - Continue home lamictal and vybrid   ??  Code Status:  Full Code    ___________________________________________________________________    Subjective:  No acute events overnight, Tolerating diet without difficulty. Pt nervous about surgery and anxious to know  the plan. Denies CP, increased sputum or cough, nausea/vomiting, abdominal pain.     Labs/Studies:  Labs and Studies from the last 24hrs per EMR and Reviewed    Objective:  Temp:  [36.1 ??C-36.8 ??C] 36.1 ??C  Heart Rate:  [94-126] 106  Resp:  [17-20] 18  BP: (142-169)/(65-79) 142/65  SpO2:  [94 %-96 %] 94 %,     Intake/Output Summary (Last 24 hours) at 03/27/17 1132  Last data filed at 03/27/17 0900   Gross per 24 hour   Intake           478.52 ml   Output                0 ml   Net           478.52 ml     Physical Exam   Constitutional: He is well-developed, well-nourished, and in no distress.   Neck: Neck supple.   Cardiovascular: Regular rhythm.    No murmur heard.  tachycardic   Pulmonary/Chest: Effort normal. No respiratory distress.   Mild coarse breath sounds throughout   Abdominal: Soft. He exhibits no distension. There is no tenderness.   Neurological: He is alert.   Psychiatric: Affect normal.     Christian Young  Physical Medicine & Rehabilitation - PGY1

## 2017-03-28 LAB — LYMPHOCYTES ABSOLUTE COUNT: Lab: 1.4 — ABNORMAL LOW

## 2017-03-28 LAB — CBC W/ AUTO DIFF
BASOPHILS ABSOLUTE COUNT: 0.1 10*9/L (ref 0.0–0.1)
HEMATOCRIT: 35 % — ABNORMAL LOW (ref 41.0–53.0)
HEMOGLOBIN: 11.8 g/dL — ABNORMAL LOW (ref 13.5–17.5)
LARGE UNSTAINED CELLS: 2 % (ref 0–4)
LYMPHOCYTES ABSOLUTE COUNT: 1.4 10*9/L — ABNORMAL LOW (ref 1.5–5.0)
MEAN CORPUSCULAR HEMOGLOBIN CONC: 33.6 g/dL (ref 31.0–37.0)
MEAN CORPUSCULAR HEMOGLOBIN: 26.9 pg (ref 26.0–34.0)
MEAN CORPUSCULAR VOLUME: 80.1 fL (ref 80.0–100.0)
MEAN PLATELET VOLUME: 7.4 fL (ref 7.0–10.0)
NEUTROPHILS ABSOLUTE COUNT: 5.3 10*9/L (ref 2.0–7.5)
PLATELET COUNT: 304 10*9/L (ref 150–440)
RED BLOOD CELL COUNT: 4.37 10*12/L — ABNORMAL LOW (ref 4.50–5.90)
WBC ADJUSTED: 7.9 10*9/L (ref 4.5–11.0)

## 2017-03-28 LAB — CBC
HEMOGLOBIN: 11.5 g/dL — ABNORMAL LOW (ref 13.5–17.5)
MEAN CORPUSCULAR HEMOGLOBIN CONC: 33.4 g/dL (ref 31.0–37.0)
MEAN CORPUSCULAR VOLUME: 80.5 fL (ref 80.0–100.0)
PLATELET COUNT: 290 10*9/L (ref 150–440)
RED BLOOD CELL COUNT: 4.28 10*12/L — ABNORMAL LOW (ref 4.50–5.90)
RED CELL DISTRIBUTION WIDTH: 15.2 % — ABNORMAL HIGH (ref 12.0–15.0)
WBC ADJUSTED: 7.8 10*9/L (ref 4.5–11.0)

## 2017-03-28 LAB — BASIC METABOLIC PANEL
ANION GAP: 12 mmol/L (ref 9–15)
BLOOD UREA NITROGEN: 18 mg/dL (ref 7–21)
BUN / CREAT RATIO: 18
CALCIUM: 8.7 mg/dL (ref 8.5–10.2)
CO2: 24 mmol/L (ref 22.0–30.0)
CREATININE: 0.99 mg/dL (ref 0.70–1.30)
EGFR MDRD AF AMER: >=60 mL/min/{1.73_m2} (ref >=60)
EGFR MDRD NON AF AMER: >=60 mL/min/{1.73_m2} (ref >=60)
GLUCOSE RANDOM: 137 mg/dL (ref 65–179)
POTASSIUM: 4.2 mmol/L (ref 3.5–5.0)
SODIUM: 137 mmol/L (ref 135–145)

## 2017-03-28 LAB — PROTIME-INR: INR: 1.01

## 2017-03-28 LAB — INR: Lab: 1.01

## 2017-03-28 LAB — HEMOGLOBIN: Lab: 11.5 — ABNORMAL LOW

## 2017-03-28 LAB — EGFR MDRD AF AMER: Glomerular filtration rate/1.73 sq M.predicted.black:ArVRat:Pt:Ser/Plas/Bld:Qn:Creatinine-based formula (MDRD): >=60

## 2017-03-28 NOTE — Unmapped (Signed)
PATIENT IS ADMITTED, WILL REFILL AFTER DISCHARGE     SYMDEKO FILLED AS INPATIENT CB IN 3 WEEKS PER EW IN THERIGY     Other - Please add note - 2017/03/29 2:25pm, By Alton Revere  SYMDEKO X 1 BOX INPATIENT, CAN RESCHED X 3 WKS.     [facility] REFILL CALL RESCH'D

## 2017-03-28 NOTE — Unmapped (Signed)
Thoracic Surgery Consult Note    Requesting Attending Physician :  Peadar Loura Back, MD    Service Requesting Consult : Pulmonary Medicine    Consulting Physician: Dr. Merrie Roof      Reason for Consult:  Bronchiectasis Exacerbation with consideration of right upper lobectomy      Assessment:   Christian Young a 31 y.o.??y/o male??with PMHx of late diagnosis CF [phone] mutation), pancreatic insufficiency, CF sinus disease, HTN, depression, and DVTs on chronic anticoagulation who??presents to [facility] with Cystic fibrosis with pulmonary exacerbation (CMS-HCC).       Recommendation/Plan:    Planning on taking him to the OR for right upper lobectomy tomorrow.    Written consent has been obtained and placed in the patient's chart. or Preoperative orders will be placed by a member of the thoracic surgery team the day before surgery.    This was discussed with the consulting attending physician, Dr. Merrie Roof, who agrees with the plan of care.    Please page the thoracic surgery consult pager [phone]) with any questions or change in patient status. We appreciate the consult.         History of Present Illness:     Christian Young a 31 y.o.??y/o male??with PMHx of late diagnosis CF [phone] mutation), pancreatic insufficiency, CF sinus disease, HTN, depression, and DVTs on chronic anticoagulation who??presents to [facility] with Cystic fibrosis with pulmonary exacerbation (CMS-HCC).    Allergies:  Allergies   Allergen Reactions   ??? [place] [Aztreonam Lysine] Anaphylaxis   ??? Tobramycin Tinnitus     From OSH record-documented as tinnitus but has received IV tobra with close monitoring.       Medications:   Current Facility-Administered Medications   Medication Dose Route Frequency Provider Last Rate Last Dose   ??? acetaminophen (TYLENOL) tablet 650 mg  650 mg Oral Q4H PRN Ennis Forts, MD   650 mg at 03/28/17 1226   ??? albuterol 2.5 mg /3 mL (0.083 %) nebulizer solution 2.5 mg  2.5 mg Nebulization 4x Daily (RT) Truett Mainland, MD   2.5 mg at 03/28/17 1046   ??? amikacin (AMIKIN) 880 mg in sodium chloride (NS) 0.9% 250 mL IVPB  880 mg Intravenous Q24H Robert Bellow, MD   Stopped at 03/28/17 0724   ??? azithromycin [facility]) tablet 500 mg  500 mg Oral Q24H SCH Jeanine Luz, DO   500 mg at 03/28/17 1031   ??? budesonide-formoterol (SYMBICORT) 160-4.5 mcg/actuation inhaler 2 puff  2 puff Inhalation BID Ennis Forts, MD   2 puff at 03/28/17 1047   ??? carvedilol (COREG) tablet 3.125 mg  3.125 mg Oral BID Jeanine Luz, DO   3.125 mg at 03/28/17 1029   ??? cefTAZidime (FORTAZ) 2 g in sodium chloride 0.9 % (NS) 100 mL IVPB-MBP  2 g Intravenous [facility] Robert Bellow, MD 400 mL/hr at 03/28/17 1227 2 g at 03/28/17 1227   ??? cetirizine (ZyrTEC) tablet 10 mg  10 mg Oral Daily Ennis Forts, MD   10 mg at 03/28/17 1031   ??? cholecalciferol (vitamin D3) tablet 6,000 Units  6,000 Units Oral Daily Kaila T Yeste, DO       ??? clofazimine 50 MG capsule - PATIENT SUPPLIED  100 mg Oral daily Ennis Forts, MD   100 mg at 03/28/17 1227   ??? dornase alfa (PULMOZYME) 1 mg/mL nebulizer solution 2.5 mg  2.5 mg Inhalation Daily Truett Mainland, MD  2.5 mg at 03/28/17 1047   ??? fluticasone (FLONASE) 50 mcg/actuation nasal spray 2 spray  2 spray Each Nare Daily Ennis Forts, MD   2 spray at 03/28/17 0900   ??? heparin (porcine) 7,500 units/0.75 mL syringe  7,500 Units Subcutaneous [facility] Ennis Forts, MD   Stopped at 03/28/17 1400   ??? heparin, porcine (PF) 100 unit/mL injection 5 mL  5 mL Intravenous Q8H PRN Robert Bellow, MD   5 mL at 03/27/17 1015   ??? hydrALAZINE (APRESOLINE) injection 10 mg  10 mg Intravenous Q6H PRN Ennis Forts, MD       ??? lamoTRIgine (LaMICtal) tablet 25 mg  25 mg Oral BID Robert Bellow, MD   25 mg at 03/28/17 1029   ??? lisinopril (PRINIVIL,ZESTRIL) tablet 10 mg  10 mg Oral Daily Ennis Forts, MD   10 mg at 03/28/17 1029   ??? magnesium oxide (MAG-OX) tablet 400 mg  400 mg Oral Daily Johnanna Schneiders, MD   400 mg at 03/27/17 1401   ??? melatonin tablet 6 mg  6 mg Oral Nightly Ennis Forts, MD   6 mg at 03/27/17 2133   ??? montelukast (SINGULAIR) tablet 10 mg  10 mg Oral Nightly Ennis Forts, MD   10 mg at 03/27/17 2132   ??? MVW Complete (pediatric multivit 61-D3-vit K) 1,500-800 unit-mcg 1 capsule  1 capsule Oral BID Robert Bellow, MD   1 capsule at 03/28/17 1028   ??? omeprazole (PriLOSEC) capsule 20 mg  20 mg Oral Daily Maurine Minister V, MD   20 mg at 03/28/17 1030   ??? ondansetron (ZOFRAN-ODT) disintegrating tablet 4 mg  4 mg Oral Q8H PRN Ennis Forts, MD       ??? oxyCODONE (ROXICODONE) immediate release tablet 5 mg  5 mg Oral Q4H PRN Ennis Forts, MD   5 mg at 03/27/17 2140   ??? pancrelipase (Lip-Prot-Amyl) (CREON) 24,000-76,000 -120,000 unit capsule 264,000 units of lipase  264,000 units of lipase Oral 3xd Meals Ennis Forts, MD   264,000 units of lipase at 03/28/17 1227   ??? phytonadione (vitamin K1) (MEPHYTON) tablet 5 mg  5 mg Oral Q M and Th Johnanna Schneiders, MD   5 mg at 03/28/17 4540   ??? polyethylene glycol (MIRALAX) packet 17 g  17 g Oral BID Ennis Forts, MD   17 g at 03/24/17 2157   ??? pramipexole (MIRAPEX) tablet 0.25 mg  0.25 mg Oral Daily Ennis Forts, MD   0.25 mg at 03/28/17 1029   ??? pregabalin (LYRICA) capsule 200 mg  200 mg Oral BID Maurine Minister V, MD   200 mg at 03/28/17 1029   ??? sodium chloride 7% nebulizer solution 4 mL  4 mL Nebulization 4x Daily (RT) Johnanna Schneiders, MD   4 mL at 03/28/17 1047   ??? tezacaftor 100mg /ivacaftor 150mg  and ivacaftor 150mg  (SYMDEKO) tablets - PATIENT SUPPLIED  1 tablet Oral BID Ennis Forts, MD   1 tablet at 03/28/17 1030   ??? traZODone (DESYREL) tablet 150 mg  150 mg Oral Nightly Robert Bellow, MD   150 mg at 03/27/17 2133       The MAR has been reviewed and the patient has not received plavix or another irreversible anticoagulant in the last seven days.     Medical History:   Past Medical History:   Diagnosis Date   ??? Anxiety    ???  Chronic pain disorder    ??? Cystic fibrosis (CMS-HCC)    ??? Depression    ??? Hypertension        Surgical History:  No past surgical history on file.    Social History:  History   Smoking Status   ??? Never Smoker   Smokeless Tobacco   ??? Never Used     History   Alcohol Use No     History   Drug Use No         Family History:  Family History   Problem Relation Age of Onset   ??? Bipolar disorder Mother    ??? Depression Mother        Review of Systems:  A 12 system review of systems was negative except as noted in HPI.     Physical Exam:  Vitals:    03/27/17 1400 03/27/17 2035 03/28/17 0500 03/28/17 1343   BP: 134/64 148/85 136/76 144/80   Pulse: 110 98 78 97   Resp: 18 18 18 18    Temp: 36.6 ??C 36.8 ??C 36.8 ??C 36.4 ??C   TempSrc: Oral Oral Oral Oral   SpO2: 98% 98% 97% 98%   Weight:       Height:         Vitals:    03/21/17 2213 03/24/17 2200   Weight: (!) 122.1 kg (269 lb 1.6 oz) (!) 121.3 kg (267 lb 8 oz)       Physical Exam     Diagnostic Studies:    Data Review:    Lab Results   Component Value Date    WBC 7.8 03/28/2017    WBC 7.9 03/28/2017    HGB 11.5 (L) 03/28/2017    HGB 11.8 (L) 03/28/2017    HCT 34.4 (L) 03/28/2017    HCT 35.0 (L) 03/28/2017    PLT 290 03/28/2017    PLT 304 03/28/2017       Lab Results   Component Value Date    NA 137 03/28/2017    K 4.2 03/28/2017    CL 101 03/28/2017    CO2 24.0 03/28/2017    BUN 18 03/28/2017    CREATININE 0.99 03/28/2017    GLU 137 03/28/2017    CALCIUM 8.7 03/28/2017    MG 1.7 03/27/2017    PHOS 5.1 (H) 11/29/2016       Lab Results   Component Value Date    BILITOT 0.3 03/21/2017    PROT 6.5 03/21/2017    ALBUMIN 3.6 03/21/2017    ALT 79 (H) 03/21/2017    AST 36 03/21/2017    ALKPHOS 65 03/21/2017       Lab Results   Component Value Date    INR 1.01 03/28/2017         Chest CT: Images and report reviewed  CXR: Images and report reviewed    Xr Chest 2 Views    Result Date: 03/18/2017  EXAM: CHEST TWO VIEW DATE: 03/18/2017 5:43 PM ACCESSION: [id number] UN DICTATED: 03/18/2017 6:17 PM INTERPRETATION LOCATION: [facility] CLINICAL INDICATION: 31 years old Male with DYSPNEA--  COMPARISON: Chest radiograph dated 02/05/2017 TECHNIQUE: PA and lateral views of the chest. FINDINGS: Right chest wall port with tip terminating in the right atrium. Unchanged appearance of right upper lobe bronchiectasis. Lungs well-inflated. No new focal consolidation identified. No pleural effusion. No pneumothorax. Cardiomediastinal silhouette is unremarkable.      Sequelae of cystic fibrosis, similar to prior.

## 2017-03-28 NOTE — Unmapped (Signed)
Problem: Patient Care Overview  Goal: Plan of Care Review  Outcome: Progressing  Alert and oriented. Remains on 2L [place] PRN. Reports ongoing pleuritic chest pain, oxycodone given, all due medications given. Will continue POC     Problem: Cystic Fibrosis (Adult)  Goal: Signs and Symptoms of Listed Potential Problems Will be Absent, Minimized or Managed (Cystic Fibrosis)  Signs and symptoms of listed potential problems will be absent, minimized or managed by discharge/transition of care (reference Cystic Fibrosis (Adult) CPG).   Outcome: Progressing      Problem: Pain, Chronic (Adult)  Goal: Identify Related Risk Factors and Signs and Symptoms  Related risk factors and signs and symptoms are identified upon initiation of Human Response Clinical Practice Guideline (CPG).  Outcome: Progressing

## 2017-03-28 NOTE — Unmapped (Signed)
Medicine Daily Progress Note    Assessment/Plan:    Principal Problem:    Cystic fibrosis with pulmonary exacerbation (CMS-HCC)  Active Problems:    Essential hypertension    History of DVT (deep vein thrombosis)    Chronic pansinusitis    Pancreatic insufficiency    Mycobacterium abscessus infection  Resolved Problems:    * No resolved hospital problems. *    Christian Youngis a 31 y.o.??y/o male??with PMHx of late diagnosis CF [phone] mutation), pancreatic insufficiency, CF sinus disease, HTN, depression, and DVTs on chronic anticoagulation who??presents to [facility] with Cystic fibrosis with pulmonary exacerbation (CMS-HCC).  ??  CF with Acute??Bronchiectasis Exacerbation  Most recent PFTs 02/21/17 showed FEV1 74% which was at baseline if not better than previous. History of M. Abscessus, PsA, Steno and MRSA (by self report only). Pulm: Epifanio Lesches.   - Cultures  ????????????????????????- M abscessus 7/12, Smooth PsA 4/9 (Cipro, Levo, Tobra S) 12/17, Steno at [facility]  ????????????????????????- CF sputum -->??PsA   ????????????????????????- AFB culture/smear??ordered -->??no AFB on smear   - Abx  ????????????????????????- Ceftax (8/6 - ?), s/p Tobra (8/6-8/7)  ????????????????????????- Amikacin (8/7 - ?)  ????????????????????????- Home Azithro 500 daily, Clofazamine  - HTS nebs 7%, Albuterol q6, Teza/iva continued  - Airway clearance (home chest vest), adding further clearance as per Respiratory  - Chest CT w.o contrast: cystic fibrosis including severe scarring and volume loss with bronchiectasis involving the right upper lobe; bilateral lower lobes compatible with infection.  -V/Q scan 8/13: Decreased perfusion of the right upper lung zone (4.82% as compared to 16% on left).   - RUL lobectomy scheduled for 8/17 per patient. No need for PFTs before surgery from pulm standpoint.  - We continued heparin in place of Xarelto in light of possible surgery.   ??  Acute on Chronic Sinusitis: Prescribed Augmentin on 8/2 by PCP. CT sinus in May 2018 showed sequelae of previous sinus surgery and bilateral mucosal thickening.  - Home flonase, zyrtec, singulair   - bicarb/sodium chloride (700/2300mg ) irrigation. Pt may bring from home- nonformulary   ??  Pancreatic insufficiency 2/2 CF:??Wt 253lb (baseline 255-265)  - Home Creon 7 caps with meals, 5 with snacks (home Creaon which has 36,000 lipase per pill, re-ordered this for meals if he has, but could not re-order with snacks so ordered the 24,000 lipase pill for snacks, will need to convert meal time to appropriate 11 caps with meals)  - Dietician:   ????????????????????????- pancreatic elastase: <15 mcg/g  ????????????????????????- MVW Complete Formulation gel cap 2 daily??  ????????????????????????- check des-gama-carboxy-PT (DCP): wnl   - Dietician following: Appreciate the recs. Started Vit D 6000 IU while IP  ??  History of DVTs:??Per history may be both provoked and unprovoked, on chronic Xarelto 20mg  at home. Has not taken Xarelto since mid last week 2/2 inability to fill prescription. Held xarelto in setting of hemoptysis, changed to Heparin SQ on 8/6 at DVT ppx dosing along with not restarting xarelto in light of possible surgery.  - Continue heparin  ??  HTN:??Patient on home Lisinopril 10mg  daily and Metoprolol succinate 25mg  daily. BPs running into 140150 s/80s-90s in the afternoon. Also has baseline tachycardia in 90s-115. We changed Metoprolol for Coreg 3.125mg  po BID due to tachycardia and hypertension while hospitalized. His BP and HR were better controlled with this regimen.   - Continue Coreg 3.125mg  po BID  - PRN hydralazine  ??  Restless legs:  - Continue home mirapex 0.25 mg BID  - Continue home  Lyrica 100 mg by mouth twice daily    Depression and history of SI:??Doing well, denies SI at this time  - Continue home lamictal and vybrid   ??  Code Status:  Full Code    ___________________________________________________________________    Subjective:  No acute events overnight, Tolerating diet without difficulty. Pt nervous about surgery. States he has been scheduled for surgery tomorrow 8/17. Endorses cough today. Denies CP, increased sputum, nausea/vomiting, abdominal pain, changes in bowel or bladder.     Labs/Studies:  Labs and Studies from the last 24hrs per EMR and Reviewed    Objective:  Temp:  [36.6 ??C-36.8 ??C] 36.8 ??C  Heart Rate:  [78-110] 78  Resp:  [18] 18  BP: (134-148)/(64-85) 136/76  SpO2:  [97 %-98 %] 97 %,     Intake/Output Summary (Last 24 hours) at 03/28/17 0754  Last data filed at 03/28/17 1610   Gross per 24 hour   Intake             1287 ml   Output                0 ml   Net             1287 ml     Physical Exam   Constitutional: He is well-developed, well-nourished, and in no distress.   HENT:   Head: Normocephalic and atraumatic.   Eyes: EOM are normal.   Neck: Neck supple.   Cardiovascular: Normal rate, regular rhythm and normal heart sounds.  Exam reveals no gallop and no friction rub.    No murmur heard.  Pulmonary/Chest: Effort normal. No respiratory distress. He has rales.   Decent air movement throughout   Abdominal: Soft. Bowel sounds are normal. He exhibits no distension. There is no tenderness.   Musculoskeletal: He exhibits no edema.   Neurological: He is alert.   Skin: Skin is warm and dry.   Psychiatric: Mood and affect normal.     Mattias Walmsley T. [name] DO  Physical Medicine & Rehabilitation - PGY1

## 2017-03-28 NOTE — Unmapped (Signed)
Problem: Patient Care Overview  Goal: Plan of Care Review  Outcome: Progressing  Patient has been alert and oriented x4. Patient verbalized having pain once this shift. Patient has tolerated diet without any issues to note. Patient remains on room air. Patient ambulated in hallway without any issues to note. Continue with plan of care.     Problem: Cystic Fibrosis (Adult)  Goal: Signs and Symptoms of Listed Potential Problems Will be Absent, Minimized or Managed (Cystic Fibrosis)  Signs and symptoms of listed potential problems will be absent, minimized or managed by discharge/transition of care (reference Cystic Fibrosis (Adult) CPG).   Outcome: Progressing      Problem: VTE, DVT and PE (Adult)  Goal: Signs and Symptoms of Listed Potential Problems Will be Absent, Minimized or Managed (VTE, DVT and PE)  Signs and symptoms of listed potential problems will be absent, minimized or managed by discharge/transition of care (reference VTE, DVT and PE (Adult) CPG).   Outcome: Progressing

## 2017-03-29 LAB — BASIC METABOLIC PANEL
ANION GAP: 10 mmol/L (ref 9–15)
BUN / CREAT RATIO: 20
CALCIUM: 8.8 mg/dL (ref 8.5–10.2)
CHLORIDE: 101 mmol/L (ref 98–107)
CO2: 25 mmol/L (ref 22.0–30.0)
CREATININE: 0.88 mg/dL (ref 0.70–1.30)
EGFR MDRD AF AMER: >=60 mL/min/{1.73_m2} (ref >=60)
GLUCOSE RANDOM: 134 mg/dL — ABNORMAL HIGH (ref 65–99)
POTASSIUM: 4.1 mmol/L (ref 3.5–5.0)
SODIUM: 136 mmol/L (ref 135–145)

## 2017-03-29 LAB — BLOOD GAS CRITICAL CARE PANEL, ARTERIAL
BASE EXCESS ARTERIAL: -2.5 — ABNORMAL LOW (ref -2.0–2.0)
CALCIUM IONIZED ARTERIAL (MG/DL): 4.45 mg/dL (ref 4.40–5.40)
FIO2 ARTERIAL: 75
GLUCOSE WHOLE BLOOD: 165 mg/dL
HCO3 ARTERIAL: 22 mmol/L (ref 22–27)
HEMOGLOBIN BLOOD GAS: 10.9 g/dL — ABNORMAL LOW (ref 13.50–17.50)
O2 SATURATION ARTERIAL: 99.1 % (ref 94.0–100.0)
PCO2 ARTERIAL: 38 mmHg (ref 35.0–45.0)
PH ARTERIAL: 7.38 (ref 7.35–7.45)
PO2 ARTERIAL: 153 mmHg — ABNORMAL HIGH (ref 80.0–110.0)
POTASSIUM WHOLE BLOOD: 5 mmol/L — ABNORMAL HIGH (ref 3.4–4.6)

## 2017-03-29 LAB — CBC
HEMATOCRIT: 33.8 % — ABNORMAL LOW (ref 41.0–53.0)
MEAN CORPUSCULAR HEMOGLOBIN: 27.6 pg (ref 26.0–34.0)
MEAN CORPUSCULAR VOLUME: 80.7 fL (ref 80.0–100.0)
MEAN PLATELET VOLUME: 7.2 fL (ref 7.0–10.0)
PLATELET COUNT: 309 10*9/L (ref 150–440)
RED BLOOD CELL COUNT: 4.19 10*12/L — ABNORMAL LOW (ref 4.50–5.90)
RED CELL DISTRIBUTION WIDTH: 15.7 % — ABNORMAL HIGH (ref 12.0–15.0)
WBC ADJUSTED: 6.9 10*9/L (ref 4.5–11.0)

## 2017-03-29 LAB — MEAN CORPUSCULAR VOLUME: Lab: 80.7

## 2017-03-29 LAB — CO2: Carbon dioxide:SCnc:Pt:Ser/Plas:Qn:: 25

## 2017-03-29 LAB — PCO2 ARTERIAL: Carbon dioxide:PPres:Pt:BldA:Qn:: 38

## 2017-03-29 LAB — PROTIME: Lab: 11.1

## 2017-03-29 NOTE — Unmapped (Signed)
Medicine Daily Progress Note    Assessment/Plan:    Principal Problem:    Cystic fibrosis with pulmonary exacerbation (CMS-HCC)  Active Problems:    Essential hypertension    History of DVT (deep vein thrombosis)    Chronic pansinusitis    Pancreatic insufficiency    Mycobacterium abscessus infection  Resolved Problems:    * No resolved hospital problems. *    Christian Young??is a 31 y.o.??y/o male??with PMHx of late diagnosis CF [phone] mutation), pancreatic insufficiency, CF sinus disease, HTN, depression, and DVTs on chronic anticoagulation who??presents to [facility] with Cystic fibrosis with pulmonary exacerbation (CMS-HCC).  ??  CF with Acute??Bronchiectasis Exacerbation  Most recent PFTs 02/21/17 showed FEV1 74% which was at baseline if not better than previous. History of M. Abscessus, PsA, Steno and MRSA (by self report only). Pulm: Epifanio Lesches.   - Cultures  ????????????????????????- M abscessus 7/12, Smooth PsA 4/9 (Cipro, Levo, Tobra S) 12/17, Steno at [facility]  ????????????????????????- CF sputum -->??PsA   ????????????????????????- AFB culture/smear??ordered -->??no AFB on smear   - Abx  ????????????????????????- Ceftax (8/6 - ?), s/p Tobra (8/6-8/7)  ????????????????????????- Amikacin (8/7 - ?)  ????????????????????????- Home Azithro 500 daily, Clofazamine  - HTS nebs 7%, Albuterol q6, Teza/iva continued  - Airway clearance (home chest vest), adding further clearance as per Respiratory  - Chest CT w.o contrast: cystic fibrosis including severe scarring and volume loss with bronchiectasis involving the right upper lobe; bilateral lower lobes compatible with infection.  -V/Q scan 8/13: Decreased perfusion of the right upper lung zone (4.82% as compared to 16% on left).   - RUL lobectomy scheduled for 8/17 per patient. No need for PFTs before surgery from pulm standpoint.  - We continued heparin in place of Xarelto in light of surgery.   ??  Acute on Chronic Sinusitis: Prescribed Augmentin on 8/2 by PCP. CT sinus in May 2018 showed sequelae of previous sinus surgery and bilateral mucosal thickening.  - Home flonase, zyrtec, singulair   - bicarb/sodium chloride (700/2300mg ) irrigation. Pt may bring from home- nonformulary   ??  Pancreatic insufficiency 2/2 CF:??Wt 253lb (baseline 255-265)  - Home Creon 7 caps with meals, 5 with snacks (home Creaon which has 36,000 lipase per pill, re-ordered this for meals if he has, but could not re-order with snacks so ordered the 24,000 lipase pill for snacks, will need to convert meal time to appropriate 11 caps with meals)  - Dietician:   ????????????????????????- pancreatic elastase: <15 mcg/g  ????????????????????????- MVW Complete Formulation gel cap 2 daily??  ????????????????????????- check des-gama-carboxy-PT (DCP): wnl   - Dietician following: Appreciate the recs. Started Vit D 6000 IU while IP  ??  History of DVTs:??Per history may be both provoked and unprovoked, on chronic Xarelto 20mg  at home. Has not taken Xarelto since mid last week 2/2 inability to fill prescription. Held xarelto in setting of hemoptysis, changed to Heparin SQ on 8/6 at DVT ppx dosing along with not restarting xarelto in light of possible surgery.  - Continue heparin  ??  HTN:??Patient on home Lisinopril 10mg  daily and Metoprolol succinate 25mg  daily. BPs running into 140150 s/80s-90s in the afternoon. Also has baseline tachycardia in 90s-115. We changed Metoprolol for Coreg 3.125mg  po BID due to tachycardia and hypertension while hospitalized. His BP and HR were better controlled with this regimen.   - Continue Coreg 3.125mg  po BID  - PRN hydralazine  ??  Restless legs:  - Continue home mirapex 0.25 mg BID  - Continue home Lyrica  100 mg by mouth twice daily    Depression and history of SI:??Doing well, denies SI at this time  - Continue home lamictal and vybrid   ??  Code Status:  Full Code    ___________________________________________________________________    Subjective:  No acute events overnight, Tolerating diet without difficulty. Pt nervous about surgery. States he has been scheduled for surgery tomorrow 8/17. Endorses cough today. Denies CP, increased sputum, nausea/vomiting, abdominal pain, changes in bowel or bladder.     Labs/Studies:  Labs and Studies from the last 24hrs per EMR and Reviewed    Objective:  Temp:  [36.1 ??C-36.4 ??C] 36.4 ??C  Heart Rate:  [89-111] 89  Resp:  [18] 18  BP: (128-147)/(65-80) 128/65  SpO2:  [92 %-98 %] 94 %,     Intake/Output Summary (Last 24 hours) at 03/29/17 0719  Last data filed at 03/29/17 0700   Gross per 24 hour   Intake             1475 ml   Output                0 ml   Net             1475 ml     Physical Exam   Constitutional: He is well-developed, well-nourished, and in no distress.   HENT:   Head: Normocephalic and atraumatic.   Eyes: EOM are normal.   Neck: Neck supple.   Cardiovascular: Normal heart sounds.    Pulmonary/Chest: Effort normal. No respiratory distress. He has no wheezes. He has no rales.   Decent air movement throughout   Abdominal: Soft. Bowel sounds are normal.   Musculoskeletal: He exhibits no edema.   Neurological: He is alert.   Skin: Skin is warm and dry.   Psychiatric: Mood and affect normal.     Colletta Spillers T. [name] DO  Physical Medicine & Rehabilitation - PGY1

## 2017-03-29 NOTE — Unmapped (Signed)
Cardiothoracic Surgery Operative Note    Preoperative Diagnosis:   1. Cystic fibrosis  2. 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 Right upper lobectomy via thoracotomy  2. Tube thoracostomy x 1 (28 [place], straight)    Attending Surgeon: Merrie Roof MD    Resident Surgeon: Anthony Sar MD    Assistants: Ala Dach MD, Lewis Shock MD    Specimen: Right upper lobectomy for microbiology and permanent pathology; station 11R sent for permanent pathology.     Anesthesia: GETA, double lumen ETT    EBL: 50 mLs    Complications: None immediately apparent.     Indications:  31 year old with severe bronchiectasis of the right upper lobe in the setting of known cystic fibrosis and multiple (monthly) hospitalizations for MDR-pseudomonas and Mycobacterial abscessus pneumonias presenting today for right upper lobectomy.     Findings: Significant bronchiectasis of the right upper lobe. Normal appearance of the chest wall. No evidence of adhesive disease between lung parenchyma and the chest wall / surrounding viscera / vascular structures. Dense perivascular and peribronchial hilar tissue.     Details of the Procedure: The patient is brought into the OR and placed in supine position. A pre-induction time out outlining the patient's name, MRN, procedure, laterality, and pre-incisional antibiotics was undertaken. The patient was induced with general anesthesia. The patient was placed in lateral decubitus position with the right side up. The patient's chest was prepped and draped in the usual sterile fashion. A posterolateral thoracotomy incision was made. The chest was entered through the 5th interspace. The inferior pulmonary ligament was taken down. The posterior hilar dissection was performed to reveal a station 11R node which was sent for permanent section. The bronchus intermedius and upper lobe bronchus was identified. We continued cephelad and onto the anterior hilum to expose the middle lobe vein, and the upper lobe branch of the right superior pulmonary vein. This structure was encircled and transected using an endostapler. The ongoing PA was identified. The truncus anterior branch was identified of the right main PA was identified. This structure was similarly encircled and transected using an endostapler. The peribronchial tissue was dissected off the right upper lobe bronchus. This structure was encircled and transected using the endostapler after several hand breaths were given confirming adequate inflation of the middle and lower lobes. A dissection near the confluence of the major and minor fissures revealed the posterior ascending pulmonary artery branch, which was encircled and transected. The remainder of the fissure was separated using serial fires of the endostapler taking care not to injure the right middle lobe bronchus or the ongoing PA. The specimen was passed off the table and sent for microbiology and permanent section. The chest cavity was irrigated with warm sterile saline and there was no leak visible from the upper lobe bronchial stump.  The middle lobe was pexied to the lower lobe with a single fire of a purple load EndoGIA stapler.  A 28 French chest tube was placed and anchored to the skin. Hemostasis was confirmed throughout the field. The thoracotomy incision was closed in layers with absorbable suture. The sponge and instrument counts were correct x 2. The patient was weaned from general anesthesia, extubated safely, and transported to the post-operative recovery area without incident.     Teaching Surgeon Attestation:  I was present for the entirety of the procedure(s). Cyndy Freeze, MD  Anthony Sar, M.D. (PGY-5)  [facility] Division of Cardiothoracic Surgery  [phone] (p)  March 29, 2017 1:59 PM

## 2017-03-29 NOTE — Unmapped (Signed)
Brief Operative Note  (CSN: [id number])      Date of Surgery: 03/29/2017    Pre-op Diagnosis: bronchiectasis from CF    Post-op Diagnosis: same    Procedure(s):  REMOVAL OF LUNG, OTHER THAN PNEUMONECTOMY; SINGLE LOBE (LOBECTOMY)    Note: Revisions to procedures should be made in chart - see Procedures tab.    Performing Service: Thoracic  Surgeon(s) and Role:     * Anthony Sar, MD - Resident - Assisting     * Cherie Dark, MD - Primary     * Gita Werner Lean, MD - Assisting    Findings: destroyed upper lobe with ectatic airway    Anesthesia: General    Estimated Blood Loss: 50 mL    Complications: None    Specimens:   ID Type Source Tests Collected by Time Destination   1 : 11 R Tissue Lymph Node SURGICAL PATHOLOGY EXAM Cherie Dark, MD 03/29/2017 1216    2 :  Tissue Lung, Right Upper Lobe SURGICAL PATHOLOGY EXAM Cherie Dark, MD 03/29/2017 1322    A :  Tissue Lung, Right Upper Lobe AEROBIC/ANAEROBIC CULTURE, FUNGAL CULTURE Cherie Dark, MD 03/29/2017 1322        Implants: * No implants in log *    Surgeon Notes: I performed the procedure    Elissa Hefty Matias Thurman   Date: 03/29/2017  Time: 1:44 PM

## 2017-03-29 NOTE — Unmapped (Signed)
Problem: Patient Care Overview  Goal: Plan of Care Review  Outcome: Progressing  Alert and oriented. Family at bedside. Remains on RA - 2L PRN. Reports anxiety in regards to SX. One time dose of ativan given. Oxycodone given for pleuritic chest pain. Heparin held per pt request. NPO since 0001. Will continue to monitor     Problem: Cystic Fibrosis (Adult)  Goal: Signs and Symptoms of Listed Potential Problems Will be Absent, Minimized or Managed (Cystic Fibrosis)  Signs and symptoms of listed potential problems will be absent, minimized or managed by discharge/transition of care (reference Cystic Fibrosis (Adult) CPG).   Outcome: Progressing      Problem: VTE, DVT and PE (Adult)  Goal: Signs and Symptoms of Listed Potential Problems Will be Absent, Minimized or Managed (VTE, DVT and PE)  Signs and symptoms of listed potential problems will be absent, minimized or managed by discharge/transition of care (reference VTE, DVT and PE (Adult) CPG).   Outcome: Progressing

## 2017-03-29 NOTE — Unmapped (Signed)
Problem: Patient Care Overview  Goal: Plan of Care Review  Outcome: Progressing  Patient has been alert and oriented x4. Patient verbalized having a headache once this shift. Patient has denied shortness of breath. Patient remains on room air. Patient ambulating in room without any issues to note. Patient knows to be NPO past midnight for procedure in AM. Continue with plan of care.     Problem: Cystic Fibrosis (Adult)  Goal: Signs and Symptoms of Listed Potential Problems Will be Absent, Minimized or Managed (Cystic Fibrosis)  Signs and symptoms of listed potential problems will be absent, minimized or managed by discharge/transition of care (reference Cystic Fibrosis (Adult) CPG).   Outcome: Progressing      Problem: VTE, DVT and PE (Adult)  Goal: Signs and Symptoms of Listed Potential Problems Will be Absent, Minimized or Managed (VTE, DVT and PE)  Signs and symptoms of listed potential problems will be absent, minimized or managed by discharge/transition of care (reference VTE, DVT and PE (Adult) CPG).   Outcome: Progressing      Problem: Pain, Chronic (Adult)  Goal: Identify Related Risk Factors and Signs and Symptoms  Related risk factors and signs and symptoms are identified upon initiation of Human Response Clinical Practice Guideline (CPG).   Outcome: Progressing    Goal: Acceptable Pain/Comfort Level and Functional Ability  Patient will demonstrate the desired outcomes by discharge/transition of care.   Outcome: Progressing

## 2017-03-29 NOTE — Unmapped (Signed)
Inpatient Surgery Update      PRE-OP DOCUMENTATION: PROVIDER CERTIFICATION    I certify that the appropriate documentation of the patient's evaluation, assessment and treatment plan is found within the medical record and that the patient???s condition is unchanged from this earlier assessment.    SITE MARKING ATTESTATION    Site Marked: Yes    CONSENT FOR OPERATION OR PROCEDURE: PROVIDER CERTIFICATION    I hereby certify that the nature, purpose, benefits, usual and most frequent risks of, and alternatives to, the operation or procedure have been explained to the patient (or person authorized to sign for the patient) either by a physician or by the provider who is to perform the operation or procedure, that the patient has had an opportunity to ask questions, and that those questions have been answered. The patient or the patient's representatiive has been advised that the selected tasks may be performed by assistants to the primary health care provider(s). I believe that the patient (or person authorized to sign for the patient) understands what has been explained, and has consented to the operation or procedure.

## 2017-03-30 LAB — BASIC METABOLIC PANEL
ANION GAP: 12 mmol/L (ref 9–15)
BUN / CREAT RATIO: 22
CALCIUM: 8.3 mg/dL — ABNORMAL LOW (ref 8.5–10.2)
CHLORIDE: 99 mmol/L (ref 98–107)
CO2: 25 mmol/L (ref 22.0–30.0)
CREATININE: 0.79 mg/dL (ref 0.70–1.30)
EGFR MDRD AF AMER: >=60 mL/min/{1.73_m2} (ref >=60)
EGFR MDRD NON AF AMER: >=60 mL/min/{1.73_m2} (ref >=60)
GLUCOSE RANDOM: 116 mg/dL (ref 65–179)
SODIUM: 136 mmol/L (ref 135–145)

## 2017-03-30 LAB — CBC
HEMATOCRIT: 33.7 % — ABNORMAL LOW (ref 41.0–53.0)
MEAN CORPUSCULAR HEMOGLOBIN CONC: 32.5 g/dL (ref 31.0–37.0)
MEAN CORPUSCULAR HEMOGLOBIN: 26.8 pg (ref 26.0–34.0)
MEAN CORPUSCULAR VOLUME: 82.2 fL (ref 80.0–100.0)
MEAN PLATELET VOLUME: 7.7 fL (ref 7.0–10.0)
PLATELET COUNT: 301 10*9/L (ref 150–440)
RED CELL DISTRIBUTION WIDTH: 15.7 % — ABNORMAL HIGH (ref 12.0–15.0)
WBC ADJUSTED: 11.2 10*9/L — ABNORMAL HIGH (ref 4.5–11.0)

## 2017-03-30 LAB — MAGNESIUM
MAGNESIUM: 1.5 mg/dL — ABNORMAL LOW (ref 1.6–2.2)
Magnesium:MCnc:Pt:Ser/Plas:Qn:: 1.5 — ABNORMAL LOW

## 2017-03-30 LAB — AMIKACIN RANDOM
Amikacin^random:MCnc:Pt:Ser/Plas:Qn:: 21.2
Amikacin^random:MCnc:Pt:Ser/Plas:Qn:: 4.9

## 2017-03-30 LAB — MEAN CORPUSCULAR HEMOGLOBIN CONC: Lab: 32.5

## 2017-03-30 LAB — PHOSPHORUS
PHOSPHORUS: 5.3 mg/dL — ABNORMAL HIGH (ref 2.9–4.7)
Phosphate:MCnc:Pt:Ser/Plas:Qn:: 5.3 — ABNORMAL HIGH

## 2017-03-30 LAB — CREATININE: Creatinine:MCnc:Pt:Ser/Plas:Qn:: 0.79

## 2017-03-30 NOTE — Unmapped (Signed)
Problem: VTE, DVT and PE (Adult)  Goal: Signs and Symptoms of Listed Potential Problems Will be Absent, Minimized or Managed (VTE, DVT and PE)  Signs and symptoms of listed potential problems will be absent, minimized or managed by discharge/transition of care (reference VTE, DVT and PE (Adult) CPG).   Outcome: Progressing  Pt receiving sch SQ Heparin, no s/s of blood clot at this time.    Problem: Fall Risk (Adult)  Goal: Absence of Fall  Patient will demonstrate the desired outcomes by discharge/transition of care.   Outcome: Progressing  Falls POC in place, falls band on, bed low and locked, call bell in reach, bedside table in reach, non skid socks on, no falls this shift.    Problem: Infection, Risk/Actual (Adult)  Intervention: Manage Suspected/Actual Infection  Pt remains on contact isolation, abx administered per orders, labs drawn per orders, VSSAF, will continue to monitor.

## 2017-03-30 NOTE — Unmapped (Signed)
SURGERY PROGRESS NOTE    Admit Date: 03/18/2017, Hospital Day: 13  Hospital Service: Surg Thoracic (SRT)  Attending: Cherie Dark, MD    Assessment     Christian Young. Coffin is a 31 y.o. male with a PMH of cystic fibrosis who is 1 day post-op s/p RUL lobectomy on 03/29/17 for severe bronchiectasis.  Pain control has been the concern post-operatively.  He has been unable to take deep breaths or move around due to pain.  From a surgical standpoint he is okay to have toradol which may assist in getting the pain under control.  Will continue to monitor for signs of infection. Bowel regimen is particularly important to prevent Distal Intestinal Obstruction due to history of CF.  Will increase miralax to TID.      Plan     Neuro/Pain:  -Start toradol IV 15mg  Q6 per pain team   -Epidural in place  -Dilaudid PCA, Dilaudid IV .5mg  PRN for severe pain  -Oxycodone 5mg  Q4 PRN for pain  -Acetominophen 650mg  Q4 PRN for pain  -Pregabalin 200mg  PO 2X daily    Psych:  -lamotrigine 25mg  PO 2X daily    Cardio: hemodynamically stable  -COREG 3.125mg  PO 2X daily  -Lisinopril 10mg  PO daily    Vascular:  -Hold home Xarelto in short term post-operative period    Pulmonary:  -Right chest to water seal, IS, OOB  -albuterol 2.5mg  nebulizer solution Q6  -symbicort 2 puffs 2X daily  -dornase alfa 2.5mg  nebulizer solution daily  -fluticasone 2 sprays daily    FEN/GI:  Reg diet  -Home creon, Symdeko  -Bowel regimen: colace 100mg  PO 2X daily, Miralax 17g TID    GU/Renal:  -Continue foley until epidural is removed  -Mg repletion 800mg  2X    Heme/ID:  -amikacin 880mg  IV daily  -azithromycin 500mg  PO daily  -Clofazimine 100mg  PO daily (patient supplied)  -DVT prophy 7,500 units heparin subcutaneously    Endocrine:  -Creon    Immune:   -Zyrtec 10mg  PO daily  -Singulair 10mg  PO daily    Disposition: Stepdown status.     Please page Thoracic Surgery with questions or concerns.    Manya Silvas, Acting Intern    Subjective     Pain control was poor last night ranging from a 7-10 out of 10.  He did receive 2 PRN doses of morphine which helped for a short time.  Pain team continues to follow.  He has not yet been out of bed due to pain.  He is tolerating clear liquids and does have an appetite for food, no N/V.  Not yet passing gas, has difficulty at home and takes daily miralax.  Currently on BID miralax.      Objective     Vitals:   Temp:  [36 ??C (96.8 ??F)-37.1 ??C (98.8 ??F)] 36.8 ??C (98.2 ??F)  Heart Rate:  [90-108] 107  SpO2 Pulse:  [91-102] 91  Resp:  [16-35] 24  BP: (101-147)/(37-83) 139/78  MAP (mmHg):  [82-100] 92  A BP-1: (108-132)/(64-81) 124/81  MAP:  [79 mmHg-151 mmHg] 94 mmHg  SpO2:  [90 %-98 %] 93 %    Intake/Output last 24 hours:  I/O last 3 completed shifts:  In: 2838 [P.O.:400; I.V.:1100; IV Piggyback:1338]  Out: 1973 [Urine:1825; Blood:50; Chest Tube:98]    Physical Exam:    -General:  Appropriate, comfortable and in no apparent distress.   -Neurological: Alert and oriented x3. Moves all 4 extremities spontaneously.   -Cardiovascular: Regular rate and rhythm.  -  Pulmonary: Normal work of breathing. No accessory muscle use.  -Abdomen: Soft, non-tender, non-distended. No rebound or guarding.  -Genitourinary: Foley in place.   -Extremities: Warm, well perfused, normal skin turgor.    -----------------------------------------------------    Data Review:  Lab Results   Component Value Date    WBC 11.2 (H) 03/30/2017    HGB 11.0 (L) 03/30/2017    HCT 33.7 (L) 03/30/2017    PLT 301 03/30/2017       Lab Results   Component Value Date    NA 136 03/30/2017    K 4.4 03/30/2017    CL 99 03/30/2017    CO2 25.0 03/30/2017    BUN 17 03/30/2017    CREATININE 0.79 03/30/2017    GLU 116 03/30/2017    CALCIUM 8.3 (L) 03/30/2017    MG 1.5 (L) 03/30/2017    PHOS 5.3 (H) 03/30/2017       Lab Results   Component Value Date    BILITOT 0.3 03/21/2017    PROT 6.5 03/21/2017    ALBUMIN 3.6 03/21/2017    ALT 79 (H) 03/21/2017    AST 36 03/21/2017    ALKPHOS 65 03/21/2017 Lab Results   Component Value Date    INR 0.97 03/29/2017       Imaging:  CXR 03/30/17  Impression       Support devices unchanged.    Low lung volumes.    Small right apical pneumothorax, unchanged.    No pleural effusion.    Bibasilar opacities, unchanged.    Unchanged cardiomediastinal silhouette.     I attest that I have reviewed the student note and that the components of the history of the present illness, the physical exam, and the assessment and plan documented were performed by me or were performed in my presence by the student where I verified the documentation and performed (or re-performed) the exam and medical decision making.     Ala Dach

## 2017-03-30 NOTE — Unmapped (Signed)
Aminoglycoside Follow-Up Pharmacy Note    Christian Young is a 31 y.o. male on amikacin 860 mg IV  every 24 hours for CF exacerbation and M. abscessus.  Currently on day 11 of therapy.    CF extended infusion (1 hour):  Goal peak: 35-45 mg/L   Goal trough: <1 mg/L for 6-8 hour drug free interval    Wt Readings from Last 3 Encounters:   03/30/17 (!) 117.4 kg (258 lb 13.1 oz)   02/21/17 (!) 115 kg (253 lb 9.6 oz)   02/15/17 (!) 118.8 kg (261 lb 14.5 oz)     Results for [name], [name] (MRN [id number]) as of 03/24/2017 17:27   Ref. Range 03/20/2017 05:20 03/21/2017 05:19 03/22/2017 05:32   Bun Latest Ref Range: 7 - 21 mg/dL 14 16 15    Creatinine Latest Ref Range: 0.70 - 1.30 mg/dL 8.11 9.14 7.82       Measured peak = 21.2 mg/L  Calculated peak = 29.4 mg/L    Measured trough/random = 4.9 mg/L  Calculated trough = 0.00 mg/L    Patient-Specific Pharmacokinetic Parameters:  Vd = 25.6 L, ke = 0.33 hr-1 (Calculated 03/30/17)      Based on levels, recommend increase to 940 q24 hours and recheck levels early this week, unless change in patient clinical status warrants checking levels sooner. .    Pharmacy will continue to monitor for changes in renal function, toxicity, and efficacy and order additional levels as needed. Please page service pharmacist with questions/clarifications.    Zadie Rhine, PharmD  PGY2 Cardiology Pharmacy Resident   Pager: [phone]

## 2017-03-30 NOTE — Unmapped (Signed)
Anesthesia Acute Pain Service    Called to see patient for post-operative pain. Pain 10/10, improved to 8-9/10 with recently given Dilaudid 0.5 mg IV as ordered by this resident. No drowsiness or respiratory depression. Epidural level checked with T5 level bilaterally noted to ice.     Of note, patient endorses 4/10 pain at baseline, pre surgery. Takes Lyrica at home but no opioids pre-op currently; previously took Tramadol.    Will split brew to bupivacaine 0.15% at 8 ml/hr and Dilaudid 0.2 mg q8 minutes (4 hour limit 6 mg), with potential to increase Dilaudid dose if needed. Will order morphine 3 mg PRN for up to 2 doses while we catch up on patient's post-op pain.     Patient, wife and parents verbalized understanding and agreeable to plan. Communicated plan to oncoming RN.    Nicole Cella, MD  [facility] Anesthesia PGY4  March 29, 2017

## 2017-03-30 NOTE — Unmapped (Signed)
Problem: Patient Care Overview  Goal: Plan of Care Review  Outcome: Progressing   03/30/17 0645   OTHER   Plan of Care Reviewed With patient;mother   Plan of Care Review   Progress improving     Goal: Discharge Needs Assessment  Outcome: Progressing   03/30/17 0645   OTHER   Equipment Currently Used at Home respiratory supplies   Discharge Needs Assessment   Concerns to be Addressed adjustment to diagnosis/illness;home safety;compliance issue   Patient/Family Anticipates Transition to home with family   Transportation Anticipated family or friend will provide       Problem: Cystic Fibrosis (Adult)  Goal: Signs and Symptoms of Listed Potential Problems Will be Absent, Minimized or Managed (Cystic Fibrosis)  Signs and symptoms of listed potential problems will be absent, minimized or managed by discharge/transition of care (reference Cystic Fibrosis (Adult) CPG).   Outcome: Progressing   03/30/17 0645   Cystic Fibrosis (Adult)   Problems Assessed (Cystic Fibrosis) all   Problems Present (Cystic Fibrosis) malabsorption/maldigestion;situational response;pulmonary/respiratory complications;undernutrition/vitamin and mineral deficiency       Problem: VTE, DVT and PE (Adult)  Goal: Signs and Symptoms of Listed Potential Problems Will be Absent, Minimized or Managed (VTE, DVT and PE)  Signs and symptoms of listed potential problems will be absent, minimized or managed by discharge/transition of care (reference VTE, DVT and PE (Adult) CPG).   Outcome: Progressing   03/30/17 0645   VTE, DVT and PE (Adult)   Problems Assessed (VTE, DVT, PE) all   Problems Present (VTE, DVT, PE) deep vein thrombosis       Problem: Pain, Chronic (Adult)  Goal: Identify Related Risk Factors and Signs and Symptoms  Related risk factors and signs and symptoms are identified upon initiation of Human Response Clinical Practice Guideline (CPG).   Outcome: Progressing   03/30/17 0645   Pain, Chronic (Adult)   Related Risk Factors (Chronic Pain) surgery Signs and Symptoms (Chronic Pain) verbalization of pain/discomfort for a prolonged time period;impaired thought process/concentration;fatigue/weakness     Goal: Acceptable Pain/Comfort Level and Functional Ability  Patient will demonstrate the desired outcomes by discharge/transition of care.   Outcome: Progressing   03/30/17 0645   Pain, Chronic (Adult)   Acceptable Pain/Comfort Level and Functional Ability making progress toward outcome       Comments: Alert and oriented x4, Lung sounds diminished. Dilaudid PCA and bupivacaine epidural for pain management. Patient refused turns, repositioned and weight shifted. Foley intact, adequate urine output. Continues on IV antibiotics, IS use encouraged, CT to suction, no air leak, minimal output. Family at bedside for support, POC reviewed with understanding.

## 2017-03-30 NOTE — Unmapped (Signed)
Department of Anesthesiology  Pain Division      Acute Pain Service Follow-Up Note- Epidural      Assessment and Plan    31 y.o. yo with PMH of CF s/p R thoracotomy and RU lobectomy on 8/17 with Dr. Jacqulyn Bath with epidural cather for post-op pain.    Patient is having difficult to control pain despite bolusing epidural, increasing infusion. Overnight, hydromorphone helped his pain so he was transitioned to local only epidural and dilaudid PCA, which are not fully covering pain. On exam today, epidural catheter in place but the extent of the epidural coverage is unclear. Bupivacaine bolus helped in the PACU yesterday so epidural likely working to some degree. To better control his pain we will increase epidural rate, HM PCA dose and start Toradol (primary team approves).    Catheter status/changes: Will adjust epidural: increased rate to 10 mL/hr  Blood thinner: heparin 7500u SQ  Additional pain/sedation medications: Being managed by the Acute Pain Service.  See changes below     Recommendations:  - Increase epidural infusion 0.15% Bupivacaine to 10 mL/hr  - Increase dilaudid PCA to 0.3 mg q8 minutes  - Start Toradol 15 mg q6 hr for 3 days (8/18-8/20)  - Modified acetamionphen to 1 g PO TID  - Continue  Lyrica 200 mg BID  - Continue oxycodone 5 mg q4 hr prn    We will continue to follow.  Contact the Acute Pain Service first at [phone] with questions or concerns 24 hours a day.  If unable to reach, please contact on call Anesthesiology resident at [phone].     If applicable, naloxone available for sedation or respiratory depression.  If applicable, nalbuphine available for refractory pruritis.  Call APS for additional concerns.  No anesthesia/epidural need for foley catheter.  Contact APS before starting any anticoagulant other than Percival heparin.    CPT daily management of epidural 47829  Diagnosis: chest pain      History    Patient is a Christian y.o. yowith PMH of CF s/p R thoracotomy and RU lobectomy on 8/17 with Dr. Jacqulyn Bath with epidural cather for post-op pain    Daily Hospital Course  Patient has been having spikes in his pain, required bolus in PACU, increased infusion rate overnight, had to supplement with IV medications, started dilaudid PCA.    Epidural Catheter  Level: T8/9  LOR:  9cm  Catheter at skin: 14cm  Catheter Start Date: 03/29/17  Postop/Catheter Day: 1    Analgesia Evaluation:  Pain at rest: 8/10  Pain with activity: 8/10    Infusion: 0.15 Bupuivacaine  Continuous Infusion Rate: 84ml/hour  PCEA Settings: 2ml every 30  minutes    Additional opioid and non-opioid analgesics:   Oxycodone 5 mg x 1  Morphine 3 mg x 2  Hydromorphone PCA  Lyrica 20 BID  tylenol    Complications:  Pruritis: no  Urinary Retention: urinary catheter  Nausea: No  Vomiting: No  Sedation: No  Respiratory Depression: No    Physical Exam  Temp:  [36 ??C-37.1 ??C] 36.8 ??C  Heart Rate:  [90-108] 107  SpO2 Pulse:  [91-102] 91  Resp:  [16-35] 24  BP: (101-147)/(37-83) 139/78  MAP (mmHg):  [82-100] 92  A BP-1: (108-132)/(64-81) 124/81  MAP:  [79 mmHg-151 mmHg] 94 mmHg  SpO2:  [90 %-98 %] 93 %    GEN: Patient sitting up in bed, in moderate distress.  NG is not present.  CHEST: Unlabored breathing, supplemental oxygen is present.  Chest tube is present.  SKIN: epidural dressing clean, dry, intact; no erythema at insertion site, no fluctuance or tenderness noted  GENITOURINARY: foley catheter is present  NEUROLOGIC: patient moves all extremities, 5/5 strength in lower extremities, alert and oriented to person, place and time; other deficits noted: None    Lab Results   Component Value Date    WBC 11.2 (H) 03/30/2017    RBC 4.10 (L) 03/30/2017    HGB 11.0 (L) 03/30/2017    HCT 33.7 (L) 03/30/2017    MCV 82.2 03/30/2017    MCH 26.8 03/30/2017    MCHC 32.5 03/30/2017    RDW 15.7 (H) 03/30/2017    PLT 301 03/30/2017     Lab Results   Component Value Date    PT 11.1 03/29/2017    PT 11.5 03/28/2017    PT 14.1 (H) 11/29/2016    INR 0.97 03/29/2017    INR 1.01 03/28/2017    INR 1.20 11/29/2016

## 2017-03-30 NOTE — Unmapped (Signed)
Pulmonary Consult Service  Consult Note      Primary Service:   Thoracic Surgery  Primary Service Attending:  Cherie Dark, MD  Reason for Consult:   Cystic Fibrosis    ASSESSMENT and PLAN     Christian Young is a 31 y.o. male with h/o CF [phone] and 3905insT) c/b bronchiectasis, chronic sinusitis and pancreatic insufficiency, as well as DVT (anticoagulated), HTN and depression that was admitted to [facility] with exacerbation of CF bronchiectasis, initially to the inpatient pulmonary service. He is now s/p RUL lobectomy on 03/29/2017. We are consulted to follow from a pulmonary standpoint while he is on the thoracic surgery service.    # CF with Acute Exacerbation of Bronchiectasis. Presented with increased SOB/DOE, worsening cough/sputum production and small-volume hemoptysis consistent with exacerbation of bronchiectasis. Most recent sputum culture with smooth Pseudumonas (S: tobra, Levo/Cipro; I: Aztreonam, Zosyn; R: amikacin, cefepime, ceftazidime, imipenem/meropenem). Of note he is also being treated for macrolide-sensitive M abscessus with azithro, clofazimine and inhaled amikacin, tentative end date of 11/2017 per his primary pulmonologist, Dr. Koren Shiver', note. Of note, Mr. Blagg has had ~8 admissions for exacerbations requiring IV antibiotics the past year. CT demonstrates significant scarring and volume loss in RUL, and perfusion scan demonstrates decreased perfusion to this lobe... Given concern his RUL was likely contributing to recurrent infections without contributing to ventilation/perfusion, he underwent RUL lobectomy 8/17.  ?? Continue Ceftazidime IV (anticipate 2 week course)  ?? Continue Amikacin IV (anticipate 2 weeks of IV, then transition back to long-term inhaled)  ?? Continue home azithromycin, clofazimine given M abscessus. Holding inhaled Amikacin while on IV Amikacin.  ?? Continue Symdeko (CFTR Modulator)  ?? For airway clearance -- continue hypertonic saline nebs 7% qid, albuterol nebs qid, pulmozyme, and Aerobika or flutter valve. Okay to hold vest therapy given post-op with chest tube.    # R apical pneumothorax, small, in setting of R thoracotomy and RUL lobectomy  ?? R chest tube in place, management per thoracic surgery team    # CF-related GI manifestations. Because of CF, Mr. Dearinger is prone to constipation and potentially obstruction (ie DIOS). This could be further exacerbated with post-op ileus as well as reduced motility due to narcotics.  ?? Please minimize narcotics as you're able to.  ?? Would continue miralax TID and colace BID; low threshold to escalate bowel regimen if needed    # Acute on Chronic Pain.  ?? As discussed above, minimize narcotics as reasonably able to and ensure bowel regimen  ?? NB:    Would avoid NSAIDs (eg Toradol) with aminoglycoside usage which already predisposes him to nephrotoxicity - this is critically important.    # CF-related pancreatic insufficiency  ?? Continue Creon with meals/snacks and Vitamins (MVW Complete for CF)  ?? Appreciate dietician    # CF-related Chronic SInusitis  ?? Continue home Flonase, Zyrtec, Singulair,       Thank you for allowing [name] to participate in this patient's care. Please do not hesitate to call the on-call pulmonary fellow at [phone] with any questions.    This patient was seen and discussed with Dr. Theresia Bough.     Randa Ngo, MD  [facility] Pulmonary and Critical Care Medicine Fellow  Personal pager: [phone] - 24-hr consult pager: [phone]    HISTORY:     History of Present Illness:  Christian Young is a 31 y.o. male with h/o CF [phone] and 3905insT) c/b bronchiectasis, chronic sinusitis and pancreatic insufficiency, as well as DVT (anticoagulated),  HTN and depression. He presented with increased SOB/DOE, worsening cough/sputum production and small-volume hemoptysis consistent with exacerbation of bronchiectasis, and was admitted for IV antibiotics. Of note, this was his ~8 admission for exacerbations this past year. CT demonstrated significant disease in RUL including bronchiectasis with volume loss and severe scarring. He underwent a NM perfusion scan which demonstrated decreased perfusion of the right upper lung zone. The CF team discussed pros/cons with Mr. Blackwell and ultimately decision was made to pursue RUL lobectomy, which he underwent 8/17; operative findings include destroyted upper lobe with ectatic airway. He was transferred to the CTSU with a chest tube on the thoracic surgery service post-operatively.    Post-operatively, he was experiencing significant 10/10 pain, and the anesthesia acute pain service was consulted. He has an epidural and is on bupivacaine/Dilaudid. They also started Toradol this morning. This morning on our assessment, he appears much more comfortable, although he still reports pain 8/10. No SOB. Feels like cough/sputum production has improved.      Past Medical History:  Past Medical History:   Diagnosis Date   ??? Anxiety    ??? Chronic pain disorder    ??? Cystic fibrosis (CMS-HCC)    ??? Depression    ??? Hypertension      No past surgical history on file.    Other History:  Family History   Problem Relation Age of Onset   ??? Bipolar disorder Mother    ??? Depression Mother      Social History     Social History   ??? Marital status: Married     Spouse name: N/A   ??? Number of children: N/A   ??? Years of education: N/A     Occupational History   ??? Not on file.     Social History Main Topics   ??? Smoking status: Never Smoker   ??? Smokeless tobacco: Never Used   ??? Alcohol use No   ??? Drug use: No   ??? Sexual activity: Yes     Partners: Female     Birth control/ protection: Condom     Other Topics Concern   ??? Not on file     Social History Narrative   ??? No narrative on file       Home Medications:  No current facility-administered medications on file prior to encounter.      Current Outpatient Prescriptions on File Prior to Encounter   Medication Sig Dispense Refill   ??? albuterol (PROVENTIL HFA;VENTOLIN HFA) 90 mcg/actuation inhaler Inhale 2 puffs every six (6) hours as needed.     ??? albuterol 2.5 mg /3 mL (0.083 %) nebulizer solution Inhale 2.5 mg by nebulization Two (2) times a day.      ??? alcohol swabs PadM Use as directed with inhaled antibiotics 100 each 11   ??? amikacin (AMIKIN) 500 mg/2 mL injection Withdraw 2 mL (500mg ) and add to neb cup. Administer using nebulizer BID. 60 vial 11   ??? azithromycin (ZITHROMAX) 500 MG tablet Take 500 mg by mouth daily.     ??? budesonide-formoterol (SYMBICORT) 160-4.5 mcg/actuation inhaler Inhale 2 puffs Two (2) times a day.     ??? cetirizine (ZYRTEC) 10 MG tablet Take 10 mg by mouth daily.     ??? CLOFAZIMINE ORAL Take 100 mg by mouth daily.     ??? dornase alfa (PULMOZYME) 1 mg/mL nebulizer solution Inhale 2.5 mg daily.      ??? fluticasone (FLONASE) 50 mcg/actuation nasal spray 2 sprays by Each  Nare route daily. 16 g 0   ??? lamoTRIgine (LAMICTAL) 25 MG tablet Take 25 mg by mouth daily. Then increase to BID after 1 week.     ??? lipase-protease-amylase (CREON) 36,000-114,000- 180,000 unit CpDR Take 7 capsules with meals and 5 capsules with snacks 930 capsule 6   ??? lisinopril (PRINIVIL,ZESTRIL) 10 MG tablet Take 10 mg by mouth daily.      ??? melatonin 3 mg Tab Take 2 tablets (6 mg total) by mouth nightly.  0   ??? metoprolol succinate (TOPROL-XL) 25 MG 24 hr tablet Take 25 mg by mouth daily.      ??? montelukast (SINGULAIR) 10 mg tablet Take 10 mg by mouth nightly.     ??? NON FORMULARY MVW Probiotic (specifically for patients with cystic fibrosis) - 1 capsule daily     ??? NON FORMULARY MVW Complete Formulation D500 Multivitamin (specific for patients with cystic fibrosis) 1 capsule twice daily     ??? omeprazole (PRILOSEC) 20 MG capsule Take 20 mg by mouth daily.     ??? PARI LC D NEBULIZER Misc Provide 1 device  for use with inhaled medication. 1 each 11   ??? pramipexole (MIRAPEX) 0.125 MG tablet Take 0.25 mg by mouth daily.      ??? pregabalin (LYRICA) 100 MG capsule Take 200 mg by mouth Two (2) times a day.      ??? rivaroxaban (XARELTO) 20 mg tablet Take 20 mg by mouth daily with evening meal.     ??? sodium bicarb-sodium chloride 700-2,300 mg Pack Irrigate each nostril with 120 mL of solution (1 packet mixed with 240 mL of distilled water) BID     ??? sodium chloride (NS) Soln Add 1mL NaCl into neb cup along with amikacin. 600 mL 11   ??? sodium chloride 7% 7 % Nebu Inhale 4 mL by nebulization Two (2) times a day.     ??? syringe with needle (SYRINGE 3CC/20GX1) 3 mL 20 gauge x 1 Syrg For use with inhaled antibiotic 60 Syringe 11   ??? tezacaftor 100mg /ivacaftor 150mg  and ivacaftor 150mg  (SYMDEKO) tablets Take 1 tablet (tezacaftor 100mg /ivacaftor 150mg ) by mouth every AM and 1 tablet (ivacaftor 150mg ) every PM with fatty food 56 tablet 3   ??? traZODone (DESYREL) 100 MG tablet Take 1.5 tablets (150 mg total) by mouth nightly. 1 tablet 0   ??? vilazodone HCl (VIIBRYD ORAL) Take 20 mg by mouth daily. Titrating 10mg  daily x 1 week.         In-Hospital Medications:  Scheduled:  ??? albuterol  2.5 mg Nebulization 4x Daily (RT)   ??? amikacin  880 mg Intravenous Q24H   ??? azithromycin  500 mg Oral Q24H SCH   ??? budesonide-formoterol  2 puff Inhalation BID   ??? carvedilol  3.125 mg Oral BID   ??? cefTAZidime  2 g Intravenous [facility]   ??? cetirizine  10 mg Oral Daily   ??? cholecalciferol (vitamin D3)  6,000 Units Oral Daily   ??? clofazimine (bulk)  100 mg Oral daily   ??? docusate sodium  100 mg Oral BID   ??? dornase alfa  2.5 mg Inhalation Daily   ??? fluticasone  2 spray Each Nare Daily   ??? heparin (porcine) for subcutaneous use  7,500 Units Subcutaneous [facility]   ??? lamoTRIgine  25 mg Oral BID   ??? lisinopril  10 mg Oral Daily   ??? magnesium oxide  400 mg Oral Daily   ??? melatonin  6 mg Oral  Nightly   ??? montelukast  10 mg Oral Nightly   ??? MVW Complete (pediatric multivit 61-D3-vit K)  1 capsule Oral BID   ??? omeprazole  20 mg Oral Daily   ??? pancrelipase (Lip-Prot-Amyl)  264,000 units of lipase Oral 3xd Meals   ??? phytonadione (vitamin K1)  5 mg Oral Q M and Th   ??? polyethylene glycol  17 g Oral BID   ??? polyethylene glycol  17 g Oral Daily   ??? pramipexole  0.25 mg Oral Daily   ??? pregabalin  200 mg Oral BID   ??? sodium chloride 7%  4 mL Nebulization 4x Daily (RT)   ??? tezacaftor-ivacaftor  1 tablet Oral BID   ??? traZODone  150 mg Oral Nightly     Continuous Infusions:  ??? bupivacaine epidural     ??? HYDROmorphone in 0.9 % NaCl     ??? sodium chloride 100 mL/hr (03/29/17 1753)     PRN Medications:  acetaminophen, heparin, porcine (PF), hydrALAZINE, HYDROmorphone, MORPhine injection, naloxone, ondansetron, ondansetron, oxyCODONE    Allergies:  Allergies as of 03/18/2017 - Reviewed 03/18/2017   Allergen Reaction Noted   ??? [place] [aztreonam lysine] Anaphylaxis 12/27/2016         PHYSICAL EXAM:   BP 134/83  - Pulse 101  - Temp 36.9 ??C (Oral)  - Resp 30  - Ht 182.9 cm (6')  - Wt (!) 120 kg (264 lb 9.6 oz)  - SpO2 97%  - BMI 35.89 kg/m??   General: Comfortably lying in bed, appears slightly drowsy but awakens easily to voice and is conversant  HEENT: EOMI, sclera anicteric  CV: Tachy rate, regular rhythm, no murmurs  Lungs: No increased work of breath. Anteriorly decreased right upper lung fields. Upper lung field crackles. No wheezes.  Chest: R-sided chest tube, small air leak present  Abd: Soft, NT, hypoactive bowel sounds  Ext: Warm, well perfused      LABORATORY and RADIOLOGY DATA:     Pertinent Laboratory Data:  Lab Results   Component Value Date    WBC 6.9 03/29/2017    HGB 11.6 (L) 03/29/2017    HCT 33.8 (L) 03/29/2017    PLT 309 03/29/2017       Lab Results   Component Value Date    NA 135 03/29/2017    K 5.0 (H) 03/29/2017    CL 101 03/29/2017    CO2 25.0 03/29/2017    BUN 18 03/29/2017    CREATININE 0.88 03/29/2017    GLU 134 (H) 03/29/2017    CALCIUM 8.8 03/29/2017    MG 1.7 03/27/2017    PHOS 5.1 (H) 11/29/2016       Lab Results   Component Value Date    BILITOT 0.3 03/21/2017    PROT 6.5 03/21/2017    ALBUMIN 3.6 03/21/2017    ALT 79 (H) 03/21/2017    AST 36 03/21/2017    ALKPHOS 65 03/21/2017       Lab Results   Component Value Date    INR 0.97 03/29/2017       Pertinent Micro Data:  Susceptibility  03/18/17 smooth PsA     Smooth Pseudomonas aeruginosa     KIRBY BAUER     Amikacin Resistant     Aztreonam Intermediate     Cefepime Resistant     Ceftazidime Resistant     Ciprofloxacin Susceptible     Imipenem Resistant     Levofloxacin Susceptible     Meropenem Resistant  Piperacillin + Tazobactam Intermediate     Tobramycin Susceptible                        Pertinent Imaging Data:  CXR 03/29/17 (personally reviewed)  -Postsurgical changes from right thoracotomy/upper lobectomy.    -Small right apical pneumothorax.    Attending addendum:    I confirm that I saw this patient as noted above in the hospital at [facility] with the Pulmonary Fellow  - we discussed the clinical history thoroughly, examined the patient fully, and evaluated the test results.  I agree with the plan for treatment and follow up as outlined.    Op went well, pain now the issue, and avoidance of complications like GI blockage on narcs (use miralax or GoLytley liberally), and renal injury (amionoglycs with other drugs like toradol).  And do as much airway clearance as he can with the limits of the pain etc.      Collene Leyden MD FCCP

## 2017-03-31 LAB — CBC
HEMATOCRIT: 31.3 % — ABNORMAL LOW (ref 41.0–53.0)
HEMOGLOBIN: 10.3 g/dL — ABNORMAL LOW (ref 13.5–17.5)
MEAN CORPUSCULAR HEMOGLOBIN CONC: 32.9 g/dL (ref 31.0–37.0)
MEAN CORPUSCULAR VOLUME: 81.3 fL (ref 80.0–100.0)
MEAN PLATELET VOLUME: 7 fL (ref 7.0–10.0)
PLATELET COUNT: 283 10*9/L (ref 150–440)
RED BLOOD CELL COUNT: 3.85 10*12/L — ABNORMAL LOW (ref 4.50–5.90)
RED CELL DISTRIBUTION WIDTH: 16 % — ABNORMAL HIGH (ref 12.0–15.0)
WBC ADJUSTED: 10 10*9/L (ref 4.5–11.0)

## 2017-03-31 LAB — GLUCOSE RANDOM: Glucose:MCnc:Pt:Ser/Plas:Qn:: 131

## 2017-03-31 LAB — BASIC METABOLIC PANEL
ANION GAP: 7 mmol/L — ABNORMAL LOW (ref 9–15)
ANION GAP: 13 mmol/L (ref 9–15)
BLOOD UREA NITROGEN: 20 mg/dL (ref 7–21)
BLOOD UREA NITROGEN: 21 mg/dL (ref 7–21)
BUN / CREAT RATIO: 23
BUN / CREAT RATIO: 21
CALCIUM: 8.3 mg/dL — ABNORMAL LOW (ref 8.5–10.2)
CHLORIDE: 97 mmol/L — ABNORMAL LOW (ref 98–107)
CO2: 29 mmol/L (ref 22.0–30.0)
CO2: 24 mmol/L (ref 22.0–30.0)
CREATININE: 0.88 mg/dL (ref 0.70–1.30)
CREATININE: 0.99 mg/dL (ref 0.70–1.30)
EGFR MDRD AF AMER: >=60 mL/min/{1.73_m2} (ref >=60)
EGFR MDRD AF AMER: >=60 mL/min/{1.73_m2} (ref >=60)
EGFR MDRD NON AF AMER: >=60 mL/min/{1.73_m2} (ref >=60)
EGFR MDRD NON AF AMER: >=60 mL/min/{1.73_m2} (ref >=60)
GLUCOSE RANDOM: 195 mg/dL — ABNORMAL HIGH (ref 65–179)
POTASSIUM: 4.4 mmol/L (ref 3.5–5.0)
POTASSIUM: 4.6 mmol/L (ref 3.5–5.0)

## 2017-03-31 LAB — MAGNESIUM: Magnesium:MCnc:Pt:Ser/Plas:Qn:: 1.9

## 2017-03-31 LAB — PHOSPHORUS: Phosphate:MCnc:Pt:Ser/Plas:Qn:: 4.1

## 2017-03-31 LAB — RED BLOOD CELL COUNT: Lab: 3.85 — ABNORMAL LOW

## 2017-03-31 LAB — SODIUM: Sodium:SCnc:Pt:Ser/Plas:Qn:: 133 — ABNORMAL LOW

## 2017-03-31 NOTE — Unmapped (Signed)
Problem: Patient Care Overview  Goal: Plan of Care Review  Outcome: Progressing    A&O x4. VSS on 2-4L O2. Pain control an issue overnight; gave PRN IV dilaudid x1; refused PRN oxycodone; getting scheduled toradol, has dPCA and epidural - states pain is 7-10/10. CT x1 to water seal; large output overnight; + air leak. Dressings C/D/I. Port flushes well but is very positional w/ sluggish blood return. Foley in place; adequate amber UO. No BM. Wife and mother at bedside. Will continue to monitor.       Goal: Individualization and Mutuality  Outcome: Progressing    Goal: Discharge Needs Assessment  Outcome: Progressing      Problem: Cystic Fibrosis (Adult)  Goal: Signs and Symptoms of Listed Potential Problems Will be Absent, Minimized or Managed (Cystic Fibrosis)  Signs and symptoms of listed potential problems will be absent, minimized or managed by discharge/transition of care (reference Cystic Fibrosis (Adult) CPG).   Outcome: Progressing      Problem: VTE, DVT and PE (Adult)  Goal: Signs and Symptoms of Listed Potential Problems Will be Absent, Minimized or Managed (VTE, DVT and PE)  Signs and symptoms of listed potential problems will be absent, minimized or managed by discharge/transition of care (reference VTE, DVT and PE (Adult) CPG).   Outcome: Progressing      Problem: Pain, Chronic (Adult)  Goal: Identify Related Risk Factors and Signs and Symptoms  Related risk factors and signs and symptoms are identified upon initiation of Human Response Clinical Practice Guideline (CPG).   Outcome: Progressing    Goal: Acceptable Pain/Comfort Level and Functional Ability  Patient will demonstrate the desired outcomes by discharge/transition of care.   Outcome: Progressing      Problem: Fall Risk (Adult)  Goal: Identify Related Risk Factors and Signs and Symptoms  Related risk factors and signs and symptoms are identified upon initiation of Human Response Clinical Practice Guideline (CPG).   Outcome: Progressing    Goal: Absence of Fall  Patient will demonstrate the desired outcomes by discharge/transition of care.   Outcome: Progressing      Problem: Infection, Risk/Actual (Adult)  Goal: Identify Related Risk Factors and Signs and Symptoms  Related risk factors and signs and symptoms are identified upon initiation of Human Response Clinical Practice Guideline (CPG).   Outcome: Progressing    Goal: Infection Prevention/Resolution  Patient will demonstrate the desired outcomes by discharge/transition of care.   Outcome: Progressing

## 2017-03-31 NOTE — Unmapped (Signed)
Problem: Patient Care Overview  Goal: Plan of Care Review  Outcome: Progressing  ST per tele with VSS. Has had significant pain at incision and chest tube site - controlled with epidural and dilaudid PCA. Chest tube to water seal with moderate serosanguinous drainage - dressing changed with old drainage noted at site. On 3L [place] - will continue to wean O2 as appropriate. Ambulated in hall once so far this shift. Will continue to encourage pulm hygiene.

## 2017-03-31 NOTE — Unmapped (Signed)
SURGERY PROGRESS NOTE    Admit Date: 03/18/2017, Hospital Day: 14  Hospital Service: Surg Thoracic (SRT)  Attending: Cherie Dark, MD    Assessment     Christian [name] is a 31 y.o. male with a PMH of cystic fibrosis who is 2 days post-op s/p RUL lobectomy on 03/29/17 for severe bronchiectasis.  His pain control has improved with the addition of toradol but continues to be a significant problem.  Will continue to monitor for distal intestinal obstruction and signs of infection.  Pharmacy continues to follow for amikacin management.  Will need PICC line before discharge for IV antibiotics.  Chest tube will remain to water seal due to high serosanguinous output.      Plan     Neuro/Pain:  -Toradol IV 15mg  Q6   -Epidural in place  -Dilaudid PCA, Dilaudid IV .5mg  PRN for severe pain  -Oxycodone 5mg  Q4 PRN for pain  -Acetominophen 650mg  Q4 PRN for pain  -Pregabalin 200mg  PO 2X daily    Psych:  -lamotrigine 25mg  PO 2X daily    Cardio: Hemodynamically stable  -COREG 3.125mg  PO 2X daily  -Lisinopril 10mg  PO daily    Vascular:  -Hold home Xarelto in short term post-operative period until epidural is removed    Pulmonary:  -Right chest to water seal, IS, OOB  -albuterol 2.5mg  nebulizer solution Q6  -symbicort 2 puffs 2X daily  -dornase alfa 2.5mg  nebulizer solution daily  -fluticasone 2 sprays daily    FEN/GI:  Reg diet  -Home creon, Symdeko  -Bowel regimen: colace 100mg  PO 2X daily, Miralax 17g TID    GU/Renal:  -Continue foley until epidural is removed  -Will recheck BMP this afternoon to monitor creatinine function on both amikacin and toradol.      Heme/ID:  -amikacin 940 mg IV daily- pharmacy following levels  -azithromycin 500mg  PO daily  -Clofazimine 100mg  PO daily (patient supplied)  -DVT prophy 7,500 units heparin subcutaneously    Endocrine:  -Home creon    Immune:   -Zyrtec 10mg  PO daily  -Singulair 10mg  PO daily    Disposition: Stepdown status.     Please page Thoracic Surgery with questions or concerns. Manya Silvas, Acting Intern    Subjective     Pain control was slightly more effective last night with the addition of PRN toradol.  Was able to ambulate around the halls.  Is passing flatus, has not yet had a bowel movement.  He is currently on TID miralax and colace.  Tolerating a normal diet.  Pulling 1250 on incentive spirometer.      Objective     Vitals:   Temp:  [36.7 ??C (98.1 ??F)-37 ??C (98.6 ??F)] 36.7 ??C (98.1 ??F)  Heart Rate:  [90-107] 91  Resp:  [15-28] 16  BP: (110-139)/(53-78) 116/60  MAP (mmHg):  [71-92] 75  SpO2:  [85 %-96 %] 96 %    Intake/Output last 24 hours:  I/O last 3 completed shifts:  In: 1260 [P.O.:1160; IV Piggyback:100]  Out: 2728 [Urine:2225; Chest Tube:503]  Chest tube without air leak on water seal.  Serosanguinous output.    Physical Exam:    -General:  Appears uncomfortable but in no apparent distress.    -Neurological: Alert and oriented x3. Moves all 4 extremities spontaneously.   -Cardiovascular: Regular rate and rhythm.  -Pulmonary: Breathing limited due to pain.  Easily audible congestion with limited ability to cough.  Coarse breath sounds bilaterally, particularly in the lower lung fields. No accessory muscle use.  -  Abdomen: Soft, non-tender, non-distended. No rebound or guarding.  -Genitourinary: Foley in place.   -Extremities: Warm, well perfused, normal skin turgor.    -----------------------------------------------------    Data Review:  Lab Results   Component Value Date    WBC 10.0 03/31/2017    HGB 10.3 (L) 03/31/2017    HCT 31.3 (L) 03/31/2017    PLT 283 03/31/2017       Lab Results   Component Value Date    NA 133 (L) 03/31/2017    K 4.4 03/31/2017    CL 97 (L) 03/31/2017    CO2 29.0 03/31/2017    BUN 20 03/31/2017    CREATININE 0.88 03/31/2017    GLU 131 03/31/2017    CALCIUM 8.5 03/31/2017    MG 1.9 03/31/2017    PHOS 4.1 03/31/2017       Lab Results   Component Value Date    BILITOT 0.3 03/21/2017    PROT 6.5 03/21/2017    ALBUMIN 3.6 03/21/2017    ALT 79 (H) 03/21/2017    AST 36 03/21/2017    ALKPHOS 65 03/21/2017       Lab Results   Component Value Date    INR 0.97 03/29/2017       Imaging:  FINDINGS/      Impression       Support devices unchanged.    Low lung volumes.    Small right apical pneumothorax, minimally decreased.    Trace bilateral pleural effusions.    Right lung and bibasilar opacities, unchanged.    Unchanged cardiomediastinal silhouette.

## 2017-03-31 NOTE — Unmapped (Signed)
Department of Anesthesiology  Pain Division      Acute Pain Service Follow-Up Note- Epidural      Assessment and Plan    31 y.o. Christian [name] with PMH of CF s/p R thoracotomy and RU lobectomy on 8/17 with Dr. Jacqulyn Bath with epidural cather for post-op pain.    Patients pain is improved today, although he reports his pain as a 8/10 at rest and 9.5/10 with activity he notes that his pain management is improved today with the changes made yesterday and that he feels satisfied. He notes significant pain on dressing changes and we placed an order for supplemental hydromorphone (0.5 mg) in the case of a dressing change.     Catheter status/changes: Epidural working well, will continue epidural without changes.  Blood thinner: heparin 7500u SQ  Additional pain/sedation medications: Being managed by the Acute Pain Service.  See changes below     Recommendations:  - Maintain epidural infusion 0.15% Bupivacaine to 10 mL/hr  - Continue dilaudid PCA to 0.3 mg q8 minutes  - Continue Toradol 15 mg q6 hr for 3 days (8/18-8/20), will stop tomorrow  - Continue acetamionphen to 1 g PO TID  - Continue  Lyrica 200 mg BID  - Continue oxycodone 5 mg q4 hr prn    We will continue to follow.  Contact the Acute Pain Service first at [phone] with questions or concerns 24 hours a day.  If unable to reach, please contact on call Anesthesiology resident at [phone].     If applicable, naloxone available for sedation or respiratory depression.  If applicable, nalbuphine available for refractory pruritis.  Call APS for additional concerns.  No anesthesia/epidural need for foley catheter.  Contact APS before starting any anticoagulant other than [place] heparin.    CPT daily management of epidural 47829  Diagnosis: chest pain      History    Patient is a 31 y.o. yowith PMH of CF s/p R thoracotomy and RU lobectomy on 8/17 with Dr. Jacqulyn Bath with epidural cather for post-op pain    Daily Hospital Course  Patient's pain is slightly improved since yesterday, he is satisfied with current pain regimen. Pain is more severe with activity and with dressing changes.     Epidural Catheter  Level: T8/9  LOR:  9cm  Catheter at skin: 14cm  Catheter Start Date: 03/29/17  Postop/Catheter Day: 2    Analgesia Evaluation:  Pain at rest: 8/10  Pain with activity: 9.5/10    Infusion: 0.15 Bupuivacaine  Continuous Infusion Rate: 30ml/hour  PCEA Settings: 2ml every 30  minutes    Additional opioid and non-opioid analgesics:   Oxycodone 5 mg x 1  Ketorlac 15 mg QID  Morphine 3 mg x 2  Hydromorphone PCA  Lyrica 200 BID  Tylenol 1000 mg TID     Complications:  Pruritis: no  Urinary Retention: urinary catheter  Nausea: No  Vomiting: No  Sedation: No  Respiratory Depression: No    Physical Exam  Temp:  [36.7 ??C (98.1 ??F)-37 ??C (98.6 ??F)] 36.7 ??C (98.1 ??F)  Heart Rate:  [90-103] 91  Resp:  [15-28] 16  BP: (110-131)/(53-68) 116/60  MAP (mmHg):  [Christian-87] 75  SpO2:  [85 %-96 %] 96 %    GEN: Patient sitting up in bed, in minimum distress.  NG is not present.  CHEST: Unlabored breathing, supplemental oxygen is present.  Chest tube is present.  SKIN: epidural dressing clean, dry, intact; no erythema at insertion site, no fluctuance or tenderness noted  GENITOURINARY: foley catheter is present  NEUROLOGIC: patient moves all extremities, 5/5 strength in lower extremities, alert and oriented to person, place and time; other deficits noted: None    Lab Results   Component Value Date    WBC 10.0 03/31/2017    RBC 3.85 (L) 03/31/2017    HGB 10.3 (L) 03/31/2017    HCT 31.3 (L) 03/31/2017    MCV 81.3 03/31/2017    MCH 26.8 03/31/2017    MCHC 32.9 03/31/2017    RDW 16.0 (H) 03/31/2017    PLT 283 03/31/2017     Lab Results   Component Value Date    PT 11.1 03/29/2017    PT 11.5 03/28/2017    PT 14.1 (H) 11/29/2016    INR 0.97 03/29/2017    INR 1.01 03/28/2017    INR 1.20 11/29/2016

## 2017-04-01 LAB — BASIC METABOLIC PANEL
ANION GAP: 9 mmol/L (ref 9–15)
BLOOD UREA NITROGEN: 17 mg/dL (ref 7–21)
BUN / CREAT RATIO: 22
CALCIUM: 8.2 mg/dL — ABNORMAL LOW (ref 8.5–10.2)
CHLORIDE: 97 mmol/L — ABNORMAL LOW (ref 98–107)
CO2: 27 mmol/L (ref 22.0–30.0)
CREATININE: 0.79 mg/dL (ref 0.70–1.30)
EGFR MDRD AF AMER: >=60 mL/min/{1.73_m2} (ref >=60)
EGFR MDRD NON AF AMER: >=60 mL/min/{1.73_m2} (ref >=60)
GLUCOSE RANDOM: 140 mg/dL (ref 65–179)
SODIUM: 133 mmol/L — ABNORMAL LOW (ref 135–145)

## 2017-04-01 LAB — PHOSPHORUS
PHOSPHORUS: 3.5 mg/dL (ref 2.9–4.7)
Phosphate:MCnc:Pt:Ser/Plas:Qn:: 3.5

## 2017-04-01 LAB — CBC
HEMATOCRIT: 29.8 % — ABNORMAL LOW (ref 41.0–53.0)
MEAN CORPUSCULAR HEMOGLOBIN CONC: 32.2 g/dL (ref 31.0–37.0)
MEAN CORPUSCULAR VOLUME: 80.2 fL (ref 80.0–100.0)
MEAN PLATELET VOLUME: 7.6 fL (ref 7.0–10.0)
RED BLOOD CELL COUNT: 3.72 10*12/L — ABNORMAL LOW (ref 4.50–5.90)
RED CELL DISTRIBUTION WIDTH: 15.7 % — ABNORMAL HIGH (ref 12.0–15.0)
WBC ADJUSTED: 7.1 10*9/L (ref 4.5–11.0)

## 2017-04-01 LAB — CO2: Carbon dioxide:SCnc:Pt:Ser/Plas:Qn:: 27

## 2017-04-01 LAB — MEAN CORPUSCULAR HEMOGLOBIN CONC: Lab: 32.2

## 2017-04-01 LAB — MAGNESIUM: Magnesium:MCnc:Pt:Ser/Plas:Qn:: 1.8

## 2017-04-01 NOTE — Unmapped (Signed)
Problem: Cystic Fibrosis (Adult)  Goal: Signs and Symptoms of Listed Potential Problems Will be Absent, Minimized or Managed (Cystic Fibrosis)  Signs and symptoms of listed potential problems will be absent, minimized or managed by discharge/transition of care (reference Cystic Fibrosis (Adult) CPG).   Patient completed all inhaled medications, refused airway clearance (chest vest/aerobika) d/t pain, tolerated treatments well.

## 2017-04-01 NOTE — Unmapped (Signed)
Department of Anesthesiology  Pain Division      Acute Pain Service Follow-Up Note- Epidural      Assessment and Plan    31 y.o. yo with PMH of CF s/p R thoracotomy and RU lobectomy on 8/17 with Dr. Jacqulyn Bath with epidural cather for post-op pain.    Patients pain is improved after increasing HM PCA. He reports oxycodone provides some relief so will increase prn dose. Patient was out of bed when we saw him again in the afternoon and reports that the combination of medications in his pain regimen are working and that his pain is overall better controlled. Of note for future instances, he has needed extra medication for dressing changes. Discussed the duration of Toradol (currently day 2/3) with primary team as the patient's pharmacist raised a concern about kidney damage with ongoing amikacin therapy. Renal function currently stable, will reassess in the morning.    Catheter status/changes: Epidural working well, will continue epidural without changes.  Blood thinner: heparin 7500u SQ  Additional pain/sedation medications: Being managed by the Acute Pain Service.  See changes below     Recommendations:  - Increased oxycodone to 5-10 mg q4 hr prn  - Maintain epidural infusion 0.15% Bupivacaine 10 mL/hr  - Continue dilaudid PCA to 0.4 mg q8 minutes  - Continue Toradol 15 mg q6 hr for 3 days (8/18-8/21), will reassess duration tomorrow  - Continue acetamionphen to 1 g PO TID  - Continue  Lyrica 200 mg BID    We will continue to follow.  Contact the Acute Pain Service first at [phone] with questions or concerns 24 hours a day.  If unable to reach, please contact on call Anesthesiology resident at [phone].     If applicable, naloxone available for sedation or respiratory depression.  If applicable, nalbuphine available for refractory pruritis.  Call APS for additional concerns.  No anesthesia/epidural need for foley catheter.  Contact APS before starting any anticoagulant other than [place] heparin.    CPT daily management of epidural 96295  Diagnosis: chest pain      History    Patient is a 31 y.o. yowith PMH of CF s/p R thoracotomy and RU lobectomy on 8/17 with Dr. Jacqulyn Bath with epidural cather for post-op pain    Daily Hospital Course  Patient had 10/10 pain overnight, PCA increased and patient reports pain is better controlled this morning.     Epidural Catheter  Level: T8/9  LOR:  9cm  Catheter at skin: 14cm  Catheter Start Date: 03/29/17  Postop/Catheter Day: 3    Analgesia Evaluation:  Pain at rest: 8/10  Pain with activity: 10/10    Infusion: 0.15 Bupuivacaine  Continuous Infusion Rate: 74ml/hour  PCEA Settings: 2ml every 30  minutes    Additional opioid and non-opioid analgesics:   Oxycodone 5 mg x 2  Ketorlac 15 mg QID  Hydromorphone PCA: 18 mg  Lyrica 200 BID  Tylenol 1000 mg TID     Complications:  Pruritis: no  Urinary Retention: urinary catheter  Nausea: No  Vomiting: No  Sedation: No  Respiratory Depression: No    Physical Exam  Temp:  [36.8 ??C-38.4 ??C] 36.8 ??C  Heart Rate:  [92-115] 97  Resp:  [18-22] 22  BP: (101-144)/(47-72) 117/65  MAP (mmHg):  [63-94] 78  SpO2:  [90 %-97 %] 95 %    GEN: Patient sitting up in bed, in minimum distress.  NG is not present.  CHEST: Unlabored breathing, supplemental oxygen is present.  Chest  tube is present.  SKIN: epidural dressing clean, dry, intact; no erythema at insertion site, no fluctuance or tenderness noted  GENITOURINARY: foley catheter is present  NEUROLOGIC: patient moves all extremities, 5/5 strength in lower extremities, alert and oriented to person, place and time; other deficits noted: None    Lab Results   Component Value Date    WBC 7.1 04/01/2017    RBC 3.72 (L) 04/01/2017    HGB 9.6 (L) 04/01/2017    HCT 29.8 (L) 04/01/2017    MCV 80.2 04/01/2017    MCH 25.8 (L) 04/01/2017    MCHC 32.2 04/01/2017    RDW 15.7 (H) 04/01/2017    PLT 327 04/01/2017     Lab Results   Component Value Date    PT 11.1 03/29/2017    PT 11.5 03/28/2017    PT 14.1 (H) 11/29/2016    INR 0.97 03/29/2017    INR 1.01 03/28/2017    INR 1.20 11/29/2016       I saw and evaluated the patient, participating in the key portions of the service.  I reviewed the resident's note and agree with the findings and plan.  Criss Rosales, MD

## 2017-04-01 NOTE — Unmapped (Signed)
SURGERY PROGRESS NOTE    Admit Date: 03/18/2017, Hospital Day: 15  Hospital Service: Surg Thoracic (SRT)  Attending: Cherie Dark, MD    Chest tube: 160 output yesterday vs 455 the day before. On water seal. Output was yellowish this A.M., concerning given fever early this morning.    IS: 1,000    Foley: In place, adequate uop    Assessment     Christian Young is a 31 y.o. male with a PMH of cystic fibrosis who is 2 days post-op s/p RUL lobectomy on 03/29/17 for severe bronchiectasis.  His pain control has improved with the addition of toradol but continues to be a significant problem.  Will continue to monitor for distal intestinal obstruction and signs of infection.  Pharmacy continues to follow for amikacin management.  Will need PICC line before discharge for IV antibiotics.       Interval: Spiked a fever last night and had yellowish CT output, so we decided to leave it in for another day. Epidural and foley potentially out tomorrow. Heparin held.    Plan     Neuro/Pain:  -Epidural in place  -Dilaudid PCA, Dilaudid IV .5mg  PRN for severe pain  -Oxycodone 5mg  Q4 PRN for pain  -Acetominophen 650mg  Q4 PRN for pain  -Pregabalin 200mg  PO 2X daily    Psych:  -lamotrigine 25mg  PO 2X daily    Cardio: Hemodynamically stable  -COREG 3.125mg  PO 2X daily  -Lisinopril 10mg  PO daily    Vascular:  -Hold home Xarelto in short term post-operative period until epidural is removed    Pulmonary:  -Right chest to water seal, IS, OOB  -albuterol 2.5mg  nebulizer solution Q6  -symbicort 2 puffs 2X daily  -dornase alfa 2.5mg  nebulizer solution daily  -fluticasone 2 sprays daily    FEN/GI:  Reg diet  -Home creon, Symdeko  -Bowel regimen: colace 100mg  PO 2X daily, Miralax 17g TID    GU/Renal:  -Continue foley until epidural is removed  -Will recheck BMP this afternoon to monitor creatinine function on both amikacin and toradol.      Heme/ID:  -amikacin 940 mg IV daily- pharmacy following levels  -azithromycin 500mg  PO daily -Clofazimine 100mg  PO daily (patient supplied)  -DVT prophy 7,500 units heparin subcutaneously    Endocrine:  -Home creon    Immune:   -Zyrtec 10mg  PO daily  -Singulair 10mg  PO daily    Disposition: Stepdown status.     Please page Thoracic Surgery with questions or concerns.    Manya Silvas, Acting Intern    Subjective     Pain control was slightly more effective last night with the addition of PRN toradol.  Was able to ambulate around the halls.  Is passing flatus, has not yet had a bowel movement.  He is currently on TID miralax and colace.  Tolerating a normal diet.  Pulling 1250 on incentive spirometer.      Objective     Vitals:   Temp:  [36.8 ??C-38.4 ??C] 36.8 ??C  Heart Rate:  [92-115] 97  Resp:  [18-22] 22  BP: (101-144)/(47-72) 117/65  MAP (mmHg):  [63-94] 78  SpO2:  [90 %-97 %] 95 %    Intake/Output last 24 hours:  I/O last 3 completed shifts:  In: 1471.3 [P.O.:960; I.V.:411.3; IV Piggyback:100]  Out: 2680 [Urine:2250; Chest Tube:430]  Chest tube without air leak on water seal.  Serosanguinous output.    Physical Exam:    -General:  Appears uncomfortable but in no apparent distress.    -  Neurological: Alert and oriented x3. Moves all 4 extremities spontaneously.   -Cardiovascular: Regular rate and rhythm.  -Pulmonary: Breathing limited due to pain.  Easily audible congestion with limited ability to cough.  Coarse breath sounds bilaterally, particularly in the lower lung fields. No accessory muscle use.  -Abdomen: Soft, non-tender, non-distended. No rebound or guarding.  -Genitourinary: Foley in place.   -Extremities: Warm, well perfused, normal skin turgor.    -----------------------------------------------------    Data Review:  Lab Results   Component Value Date    WBC 7.1 04/01/2017    HGB 9.6 (L) 04/01/2017    HCT 29.8 (L) 04/01/2017    PLT 327 04/01/2017       Lab Results   Component Value Date    NA 133 (L) 04/01/2017    K 4.0 04/01/2017    CL 97 (L) 04/01/2017    CO2 27.0 04/01/2017    BUN 17 04/01/2017    CREATININE 0.79 04/01/2017    GLU 140 04/01/2017    CALCIUM 8.2 (L) 04/01/2017    MG 1.8 04/01/2017    PHOS 3.5 04/01/2017       Lab Results   Component Value Date    BILITOT 0.3 03/21/2017    PROT 6.5 03/21/2017    ALBUMIN 3.6 03/21/2017    ALT 79 (H) 03/21/2017    AST 36 03/21/2017    ALKPHOS 65 03/21/2017       Lab Results   Component Value Date    INR 0.97 03/29/2017       Imaging:  FINDINGS/      Impression       Support devices unchanged.    Low lung volumes.    Small right apical pneumothorax, minimally decreased.    Trace bilateral pleural effusions.    Right lung and bibasilar opacities, unchanged.    Unchanged cardiomediastinal silhouette.

## 2017-04-01 NOTE — Unmapped (Signed)
Aminoglycoside Follow-Up Pharmacy Note    Christian Young is a 31 y.o. male on amikacin 860 mg IV  every 24 hours for CF exacerbation and M. abscessus.  Currently on day 12 of therapy.    CF extended infusion (1 hour):  Goal peak: 35-45 mg/L   Goal trough: <1 mg/L for 6-8 hour drug free interval    Wt Readings from Last 3 Encounters:   03/30/17 (!) 117.4 kg (258 lb 13.1 oz)   02/21/17 (!) 115 kg (253 lb 9.6 oz)   02/15/17 (!) 118.8 kg (261 lb 14.5 oz)     Results for CELESTE, CANDELAS (MRN [id number]) as of 03/24/2017 17:27   Ref. Range 03/20/2017 05:20 03/21/2017 05:19 03/22/2017 05:32   Bun Latest Ref Range: 7 - 21 mg/dL 14 16 15    Creatinine Latest Ref Range: 0.70 - 1.30 mg/dL 2.53 6.64 4.03       Measured peak = 21.2 mg/L  Calculated peak = 29.4 mg/L    Measured trough/random = 4.9 mg/L  Calculated trough = 0.00 mg/L    Patient-Specific Pharmacokinetic Parameters:  Vd = 25.6 L, ke = 0.33 hr-1 (Calculated 03/30/17)    PLAN:  Based on levels, recommend continue at 940mg  every 24hours dosing regimen as previously recommended. A repeat set of peak and troughs will be drawn tomorrow to assess appropriateness given the large dose that's required.    Pharmacy will continue to monitor for changes in renal function, toxicity, and efficacy and order additional levels as needed. Please page service pharmacist with questions/clarifications.    Gwendalyn Ege, PharmD, BCPS  Cardiothoracic ICU Clinical Pharmacist,  Vascular & Thoracic Surgery Services,  [facility] Transplant Team  VM: [phone]  P: [phone]

## 2017-04-01 NOTE — Unmapped (Signed)
Problem: Patient Care Overview  Goal: Plan of Care Review  Outcome: Progressing  A&O x4. T-max 38.4 C at 0400 vitals; SRT paged; Tylenol ordered; will give & recheck temp. All other VSS on 3L O2. CTx1 to water seal; +air leak; small output overnight. Pain is still an issue; c/o 7-10/10 pain throughout shift when awake; acute pain paged at beginning of shift; PCA settings adjusted & one time PRN IV dilaudid given; still getting scheduled toradal and Tylenol. Ambulated in room. Foley in place; UO adequate. No BM; passing gas. All incisions/dressings C/D/I. R chest port very difficult to draw blood from - may need heparin flush? Needs assessment of line patency. Pt is resting w/ mother at the bedside. Will continue to monitor.   Goal: Individualization and Mutuality  Outcome: Progressing    Goal: Discharge Needs Assessment  Outcome: Progressing      Problem: Cystic Fibrosis (Adult)  Goal: Signs and Symptoms of Listed Potential Problems Will be Absent, Minimized or Managed (Cystic Fibrosis)  Signs and symptoms of listed potential problems will be absent, minimized or managed by discharge/transition of care (reference Cystic Fibrosis (Adult) CPG).   Outcome: Progressing      Problem: VTE, DVT and PE (Adult)  Goal: Signs and Symptoms of Listed Potential Problems Will be Absent, Minimized or Managed (VTE, DVT and PE)  Signs and symptoms of listed potential problems will be absent, minimized or managed by discharge/transition of care (reference VTE, DVT and PE (Adult) CPG).   Outcome: Progressing      Problem: Pain, Chronic (Adult)  Goal: Identify Related Risk Factors and Signs and Symptoms  Related risk factors and signs and symptoms are identified upon initiation of Human Response Clinical Practice Guideline (CPG).   Outcome: Progressing    Goal: Acceptable Pain/Comfort Level and Functional Ability  Patient will demonstrate the desired outcomes by discharge/transition of care.   Outcome: Progressing      Problem: Fall Risk (Adult)  Goal: Identify Related Risk Factors and Signs and Symptoms  Related risk factors and signs and symptoms are identified upon initiation of Human Response Clinical Practice Guideline (CPG).   Outcome: Progressing    Goal: Absence of Fall  Patient will demonstrate the desired outcomes by discharge/transition of care.   Outcome: Progressing      Problem: Infection, Risk/Actual (Adult)  Goal: Identify Related Risk Factors and Signs and Symptoms  Related risk factors and signs and symptoms are identified upon initiation of Human Response Clinical Practice Guideline (CPG).   Outcome: Progressing    Goal: Infection Prevention/Resolution  Patient will demonstrate the desired outcomes by discharge/transition of care.   Outcome: Progressing

## 2017-04-02 LAB — CBC
HEMATOCRIT: 28.9 % — ABNORMAL LOW (ref 41.0–53.0)
MEAN CORPUSCULAR HEMOGLOBIN CONC: 32.6 g/dL (ref 31.0–37.0)
MEAN CORPUSCULAR HEMOGLOBIN: 26.3 pg (ref 26.0–34.0)
MEAN CORPUSCULAR VOLUME: 80.8 fL (ref 80.0–100.0)
MEAN PLATELET VOLUME: 7 fL (ref 7.0–10.0)
PLATELET COUNT: 376 10*9/L (ref 150–440)
RED BLOOD CELL COUNT: 3.58 10*12/L — ABNORMAL LOW (ref 4.50–5.90)
RED CELL DISTRIBUTION WIDTH: 15.8 % — ABNORMAL HIGH (ref 12.0–15.0)
WBC ADJUSTED: 6.4 10*9/L (ref 4.5–11.0)

## 2017-04-02 LAB — BASIC METABOLIC PANEL
ANION GAP: 8 mmol/L — ABNORMAL LOW (ref 9–15)
BLOOD UREA NITROGEN: 16 mg/dL (ref 7–21)
BUN / CREAT RATIO: 20
CALCIUM: 8.3 mg/dL — ABNORMAL LOW (ref 8.5–10.2)
CHLORIDE: 96 mmol/L — ABNORMAL LOW (ref 98–107)
CO2: 30 mmol/L (ref 22.0–30.0)
CREATININE: 0.82 mg/dL (ref 0.70–1.30)
EGFR MDRD AF AMER: >=60 mL/min/{1.73_m2} (ref >=60)
EGFR MDRD NON AF AMER: >=60 mL/min/{1.73_m2} (ref >=60)
GLUCOSE RANDOM: 141 mg/dL — ABNORMAL HIGH (ref 65–99)

## 2017-04-02 LAB — GLUCOSE RANDOM: Glucose:MCnc:Pt:Ser/Plas:Qn:: 141 — ABNORMAL HIGH

## 2017-04-02 LAB — PHOSPHORUS: Phosphate:MCnc:Pt:Ser/Plas:Qn:: 4.7

## 2017-04-02 LAB — AMIKACIN TROUGH: Amikacin^trough:MCnc:Pt:Ser/Plas:Qn:: <1.5 — ABNORMAL LOW

## 2017-04-02 LAB — AMIKACIN PEAK: Amikacin^peak:MCnc:Pt:Ser/Plas:Qn:: 20.3

## 2017-04-02 LAB — MAGNESIUM: Magnesium:MCnc:Pt:Ser/Plas:Qn:: 1.8

## 2017-04-02 LAB — MEAN CORPUSCULAR VOLUME: Lab: 80.8

## 2017-04-02 LAB — AMIKACIN RANDOM: Amikacin^random:MCnc:Pt:Ser/Plas:Qn:: 2.3

## 2017-04-02 NOTE — Unmapped (Signed)
Problem: Patient Care Overview  Goal: Plan of Care Review  Outcome: Progressing  Pt pain managed with PCA, epidural, and prn pain meds. Pt CF with ineffective clearance. Weak cough due to pain from surgery site. IS usage is encouraged.    Problem: VTE, DVT and PE (Adult)  Goal: Signs and Symptoms of Listed Potential Problems Will be Absent, Minimized or Managed (VTE, DVT and PE)  Signs and symptoms of listed potential problems will be absent, minimized or managed by discharge/transition of care (reference VTE, DVT and PE (Adult) CPG).   Outcome: Progressing      Problem: Pain, Chronic (Adult)  Goal: Acceptable Pain/Comfort Level and Functional Ability  Patient will demonstrate the desired outcomes by discharge/transition of care.   Outcome: Progressing

## 2017-04-02 NOTE — Unmapped (Signed)
Pulmonary Consult Service  Consult Note      Primary Service:   Thoracic Surgery  Primary Service Attending:  Cherie Dark, MD  Reason for Consult:   Cystic Fibrosis    ASSESSMENT and PLAN     Christian Young is a 31 y.o. male with h/o CF [phone] and [id number]) c/b bronchiectasis, chronic sinusitis and pancreatic insufficiency, as well as DVT (anticoagulated), HTN and depression that was admitted to [facility] with exacerbation of CF bronchiectasis, initially to the inpatient pulmonary service. He is now s/p RUL lobectomy on 03/29/2017. We are consulted to follow from a pulmonary standpoint while he is on the thoracic surgery service.    # CF with Acute Exacerbation of Bronchiectasis. Presented with increased SOB/DOE, worsening cough/sputum production and small-volume hemoptysis consistent with exacerbation of bronchiectasis. Sputum culture from 03/18/17 with smooth Pseudumonas (S: tobra, Levo/Cipro; I: Aztreonam, Zosyn; R: amikacin, cefepime, ceftazidime, imipenem/meropenem). Of note he is also being treated for macrolide-sensitive M abscessus with azithro, clofazimine and inhaled amikacin, tentative end date of 11/2017 per his primary pulmonologist, Dr. Koren Shiver', note. Additionally, during this admission his sputum culture from 8/17 was positive for stenotrophomonas maltophilia (R: Ceftazidime, S: Levofloxacin, Minocycline, and Trimethoprim + Sulfamethoxazole). Of note, [name] has had ~8 admissions for exacerbations requiring IV antibiotics the past year. CT demonstrates significant scarring and volume loss in RUL, and perfusion scan demonstrates decreased perfusion to this lobe. Given concern his RUL was likely contributing to recurrent infections without contributing to ventilation/perfusion, he underwent RUL lobectomy 8/17.   ?? Stop Ceftazidime IV, pseudomonas is resistant   ?? Start Zosyn IV, pseudomonal dosing  ?? Start inhaled Tobramycin  ?? Start Minocycline  ?? Continue Amikacin IV, to protect against surgical site incision, then will likely transition back to long-term inhaled  ?? Continue home azithromycin, clofazimine given M abscessus. Holding inhaled Amikacin while on IV Amikacin.  ?? Continue Symdeko (CFTR Modulator)  ?? For airway clearance -- continue hypertonic saline nebs 7% qid, albuterol nebs qid, pulmozyme, and Aerobika or flutter valve. Okay to hold vest therapy given post-op with chest tube.    # CF-related GI manifestations. Because of CF, [name] is prone to constipation and potentially obstruction (ie DIOS). This could be further exacerbated with post-op ileus as well as reduced motility due to narcotics. He is currently complaining of diarrhea. Given his fevers without a known source, would recommend evaluating and sending a C. Diff assay.   ?? Send stool C. Diff assay  ?? Please minimize narcotics as you're able to.  ?? Adjust miralax and colace while having diarrhea, however be sure to maintain at least two bowel movements per day    # Acute on Chronic Pain.  ?? As discussed above, minimize narcotics as reasonably able to and ensure bowel regimen  ?? NB:    Would avoid NSAIDs (eg Toradol) with aminoglycoside usage which already predisposes him to nephrotoxicity - this is critically important.    # CF-related pancreatic insufficiency  ?? Continue Creon with meals/snacks and Vitamins (MVW Complete for CF)  ?? Appreciate dietician    # CF-related Chronic SInusitis  ?? Continue home Flonase, Zyrtec, Singulair,       Thank you for allowing [name] to participate in this patient's care. Please do not hesitate to call the on-call pulmonary fellow at [phone] with any questions.    This patient was seen and discussed with Dr. Leticia Clas.     Staci Acosta, MD,MPH  [facility]  Fellow  Personal pager: [phone] - 24-hr consult pager: [phone]      INTERVAL HISTORY:     Post-operatively, he was experiencing significant 10/10 pain, and the anesthesia acute pain service was consulted. He has an epidural and is on bupivacaine/Dilaudid. He has spike two fevers, to 38.4 on 8/19, and to 38.1 on 8/20, he states he has chills when these fevers occurred. He notes a rash in his right arm pit/chest wall that has been improving with benedryl. His chest tube was removed this morning and       PHYSICAL EXAM:   BP 145/89  - Pulse 104  - Temp 37.8 ??C (Oral)  - Resp 20  - Ht 182.9 cm (6')  - Wt (!) 127.1 kg (280 lb 3.3 oz)  - SpO2 94%  - BMI 38.00 kg/m??   General: Comfortably lying in bed, appears slightly drowsy but awakens easily to voice and is conversant  HEENT: EOMI, sclera anicteric  CV: Tachy rate, regular rhythm, no murmurs  Lungs: No increased work of breath. Anteriorly decreased right upper lung fields. Upper lung field crackles. No wheezes.  Chest: R-sided chest tube, small air leak present  Abd: Soft, NT, hypoactive bowel sounds  Ext: Warm, well perfused      LABORATORY and RADIOLOGY DATA:     Pertinent Laboratory Data:  Lab Results   Component Value Date    WBC 6.4 04/02/2017    HGB 9.4 (L) 04/02/2017    HCT 28.9 (L) 04/02/2017    PLT 376 04/02/2017       Lab Results   Component Value Date    NA 134 (L) 04/02/2017    K 4.2 04/02/2017    CL 96 (L) 04/02/2017    CO2 30.0 04/02/2017    BUN 16 04/02/2017    CREATININE 0.82 04/02/2017    GLU 141 (H) 04/02/2017    CALCIUM 8.3 (L) 04/02/2017    MG 1.8 04/02/2017    PHOS 4.7 04/02/2017       Lab Results   Component Value Date    BILITOT 0.3 03/21/2017    PROT 6.5 03/21/2017    ALBUMIN 3.6 03/21/2017    ALT 79 (H) 03/21/2017    AST 36 03/21/2017    ALKPHOS 65 03/21/2017       Lab Results   Component Value Date    INR 0.97 03/29/2017       Pertinent Micro Data:  Susceptibility  03/18/17 smooth PsA     Smooth Pseudomonas aeruginosa     KIRBY BAUER     Amikacin Resistant     Aztreonam Intermediate     Cefepime Resistant     Ceftazidime Resistant     Ciprofloxacin Susceptible     Imipenem Resistant     Levofloxacin Susceptible     Meropenem Resistant Piperacillin + Tazobactam Intermediate     Tobramycin Susceptible                        Pertinent Imaging Data:  CXR 03/29/17 (personally reviewed)  -Postsurgical changes from right thoracotomy/upper lobectomy.    -Small right apical pneumothorax.

## 2017-04-02 NOTE — Unmapped (Signed)
Cystic Fibrosis Nutrition Assessment    Inpatient: MD Consult this admission and related follow up  Primary Pulmonologist: Dr. Koren Shiver  ===================================================================  Christian Young is a 31 y.o. male seen for medical nutrition therapy. Currently admitted with CF exacerbation for IV antibiotics.  - s/p RUL lobectomy 03-29-17.  - Reports he is eating well after surgery - choosing foods that don't make him cough too much. Notes he is having flatus and reported having BM yesterday and is feeling that he will have another BM today.  - Previously reported this admit he felt Creon was working well, did not bring home Creon 36,000 with him this admit and is not on inpatient drug formulary. Agreeable to continue to use Creon 24,000 during admission.  ===================================================================  INTERVENTION  1. Recommend change diet to High Calorie High Protein.    2. During admission continue Creon 24,000 (11 at meals, 8 at snacks).  - Can resume home Creon 36,000 after discharge.    3. Fecal elastase result noted, pancreatic insufficient.    4. Continue inpatient CF vitamin regimen:  MVW Complete Formulation gel cap 2 daily + 6000 International units vitamin D daily daily = total of 9000 International units vitamin D daily.  - When discharged home resume usual Complete Formulation D5000 softgels, 2 daily. Provides total of 10,000 International units vitamin D daily.  - Defer vitamin K supplementation to MD team given DCP is WNL.    5. Monitor blood glucose levels.    6. Weigh patient twice weekly this admit.    7. Continue remainder of nutrition regimen:  - High Calorie High Protein diet  - acid reducer  - bowel regimen    Inpatient:   Will follow up with patient per protocol: 1-2 times per week (and more frequent as indicated)===================================================================  ASSESSMENT:  Cystic Fibrosis Nutrition Category = Outstanding Current diet is appropriate for CF. Current PO intake is improving and closer to meeting estimated CF needs. Patient continues to work towards goals for weight management.   Enzyme dose is within established guidelines. Vitamin prescription is not appropriate to reach/maintain optimal fat soluble vitamin levels. Patient would benefit from change in vitamin regimen. Patient is not deficienct in vitamin K as indicated by a normal des-gamma-carboxy-PT (DCP).  DCP is a more sensitive and specific marker for vitamin K than prothrombin time (PT).   Sodium needs for CF met with PO and/or supplement. Bowel regimen is appropriate. Acid reducer appropriate for GERD and enzyme activation.    Malnutrition Assessment using AND/ASPEN Clinical Characteristics:  Patient does not meet AND/ASPEN criteria for malnutrition at this time (03/19/17 1404)    Goals:  1. Meet estimated daily needs: 1610-9604 kcals (24-28 kcals/kg/day); 146-183 gm pro (1.2-1.5 gm pro/kg/day)  2. Reach/maintain established goals for CF:                Adult - BMI 22kg/m2 for CF females and 23kg/m2 for CF males  3. Normal fat-soluble vitamin levels: Vitamin A, Vitamin E and PT per lab range; Vitamin D 25OH total >30  4. Maintain glucose control. Carbohydrate content of diet should comprise 40-50% of total calorie needs, but carbohydrates are not restricted in this population.    5.  Meet sodium needs for CF  ===================================================================  INPATIENT:    Current Nutrition Orders (inpatient):       Nutrition Orders            Start     Ordered    03/30/17 0735  Nutrition  Therapy General (Regular)  Effective now     Question:  Nutrition Therapy (T):  Answer:  General (Regular)    03/30/17 0734    03/29/17 1624  Advance diet as tolerated  Until discontinued     Comments:  Advance patient to the target diet when the following criteria are met: tolerating clears without nausea   Question:  Target Diet:  Answer:  regular 03/29/17 1624    03/19/17 0900  Supplement High Fat Milkshake; # of Products PER Serving: 1 2xd PC  2 times a day (PC)     Question Answer Comment   Select Supplement High Fat Milkshake    # of Products PER Serving 1        03/18/17 2125        CF Nutrition related medications (inpatient): Nutritionally relevant medications reviewed.   6000 International units vitamin D3 daily  Colace  Magnesium oxide  Magnesium hydroxide (milk of magnesia)  Complete Formulation 2 softgels daily  Omeprazole  Creon 24,000 (11 at meals, 8 at snacks) provides 2200 units lipase/kg/meal & 16109 units lipase/kg/day  5mg  vitamin K twice weekly  Miralax TID  Symdeko  ondansetron PRN    CF Nutrition related labs (inpatient):  04-02-17: Glu 141  03-25-17: Glu 131  03-22-17: Glu 145, Glu-POC 201  03-19-17: Glu 109, Glu-POC 166    Last 5 Recorded Weights    03/21/17 2213 03/24/17 2200 03/28/17 2200 03/30/17 0511   Weight: (!) 122.1 kg (269 lb 1.6 oz) (!) 121.3 kg (267 lb 8 oz) (!) 120 kg (264 lb 9.6 oz) (!) 117.4 kg (258 lb 13.1 oz)    04/02/17 0541   Weight: (!) 127.1 kg (280 lb 3.3 oz)   ==================================================================  CLINICAL DATA:  Past Medical History:   Diagnosis Date   ??? Anxiety    ??? Chronic pain disorder    ??? Cystic fibrosis (CMS-HCC)    ??? Depression    ??? Hypertension    Per review of Care Everywhere PMHx also includes: CFRD, PI, essential HTN    Anthroprometric Evaluation:  Weight changes: Weight up.  Per review of Care Everywhere: March 2018 weight 262 pounds  BMI Readings from Last 1 Encounters:   04/02/17 38.00 kg/m??     Wt Readings from Last 3 Encounters:   04/02/17 (!) 127.1 kg (280 lb 3.3 oz)   02/21/17 (!) 115 kg (253 lb 9.6 oz)   02/15/17 (!) 118.8 kg (261 lb 14.5 oz)     Ht Readings from Last 3 Encounters:   03/18/17 182.9 cm (6')   02/05/17 180.3 cm (5' 11)   01/10/17 180.3 cm (5' 11)   ==================================================================  Energy Intake (outpatient):  Diet: High in calories, fat, salt. Diet evaluated this visit. Reports appetite is down with current illness, eating 1-2 meals daily PTA.  Food allergies: Per review of Care Everywhere note banana is listed as allergy.  Diet and CFTR modulators: Prescribed Symdeko (tezacaftor/ivacaftor).    PO Supplements: none  Appetite Stimulant: none  Enteral feeding tube: n/a  Sodium in diet: Adequate from diet  Calcium in diet:  Adequate from diet    Fat Malabsorption (outpatient):  Enzyme brand, (meals/snacks):   Creon 36,000 (7 at meals, 5 at snacks)   - Switched from Zenpep to Creon due to report of bloating & greasy stools when on Zenpep.   Enzyme administration details: correct pre-meal administration., moderate compliance, swallows capsules whole  Enzyme dose per MEAL (units lipase/kg/meal) 2100  Enzyme dose per  DAY (units lipase/kg/day) 10800  Stools: 1-2 daily, denies oily/greasy appearance. Reports green colored stools continue, reported to RD 03-12-17 as outpatient.  Abdominal pain: None reported  Fecal Fat Studies:    Pancreatic Elastase-1   Date Value Ref Range Status   03/19/2017 <15 (L) mcg/g Final     Comment:     Adult and Pediatric Reference Ranges for    Pancreatic Elastase-1:                  Normal:      >200 mcg/g  Moderate Pancreatic        Insufficiency:   100-200 mcg/g    Severe Pancreatic        Insufficiency:      <100 mcg/g     Elastase-1 (E-1) assay results are expressed  in mcg/g, which represent mcg E1/g feces.     It is not necessary to interrupt enzyme  substitution therapy.     Test Performed by:  [facility]  [address]  [place], [place] [address]     GI meds: Nutritionally relevant medications reviewed. MVW probiotic, omeprazole.    Vitamins/Minerals (outpatient):  CF-specific MVI, dose, compliance: MVW Complete Formulation Softgel D-5000 2 daily  Other vitamins/minerals/herbals: none  Calcium supplement: none  Patient Resources: -[employer] (phone [phone] [email or web address])   - Orders CF vitamin and probiotic directly from manufacturer (MVW), cost billed directly to [employer] grant  - He is thinks he may also be enrolled in [employer] but he is unsure at this time.  Fat-soluble vitamin levels:  Lab Results   Component Value Date/Time    VITAMINA 44.4 11/19/2016 0647     Lab Results   Component Value Date/Time    VITDTOTAL 27.9 03/19/2017 0508        Lab Results   Component Value Date/Time    PT 11.1 03/29/2017 0508    PT 11.5 03/28/2017 0519    PT 14.1 (H) 11/29/2016 0807    PT 15.3 (H) 11/26/2016 0529    PT 16.1 (H) 11/23/2016 0402   03-20-17: normal DesCarboxyPT (also known as PIVKA) indicates no vitamin K deficiency.      Bone Health: unknown     CF Related Diabetes: Yes, Per Care EveryWhere has been on 5 units levemir at bedtime in the past. Also noted per Care Everywhere he was supposed to have had Endo appointment at outside facility in March 2018 - unclear if he made it to this appointment.        Lab Results   Component Value Date/Time    A1C 6.1 (H) 11/19/2016 7846

## 2017-04-02 NOTE — Unmapped (Signed)
Department of Anesthesiology  Pain Division      Acute Pain Service Follow-Up Note- Epidural      Assessment and Plan    31 y.o. yo with PMH of CF s/p R thoracotomy and RU lobectomy on 8/17 with Dr. Jacqulyn Bath with epidural cather for post-op pain.    Patients pain is improved after his chest tube removed. Patient had T of 38.1 overnight so removed epidural due to risk of infection (WBC 6.4 this AM). Patient has a good multimodal regimen that should be adequate now that chest tube is out.    Catheter status/changes: Patient's epidural pulled at 1200 (after reviewing coagulation status, 12 hours after last dose of 7500 u of heparin), tip intact.  If resuming anticoagulation, please follow these guidelines: SQ heparin can begin 2 hours after epidural removed (primary team paged), Prophylactic dose Lovenox can begin 4 hours after epidural removed, Please contact us should therapeutic dose anticoagulation is set to begin or another blood thinner.    Blood thinner: heparin 7500u SQ  Additional pain/sedation medications: Being managed by the Acute Pain Service.  See changes below     Recommendations:  - Discontinued epidural infusion  - Continue oxycodone to 5-10 mg q4 hr prn  - Continue dilaudid PCA to 0.4 mg q8 minutes  - Consider Toradol (s/p 3 days) if need more coverage and renal function adequate  - Continue acetamionphen to 1 g PO TID  - Continue Lyrica 200 mg BID    We will continue to follow.  Contact the Acute Pain Service first at [phone] with questions or concerns 24 hours a day.  If unable to reach, please contact on call Anesthesiology resident at [phone].     If applicable, naloxone available for sedation or respiratory depression.  If applicable, nalbuphine available for refractory pruritis.  Call APS for additional concerns.  No anesthesia/epidural need for foley catheter.  Contact APS before starting any anticoagulant other than Vancleave heparin.    CPT daily management of epidural 47829  Diagnosis: chest pain      History    Patient is a 31 y.o. yowith PMH of CF s/p R thoracotomy and RU lobectomy on 8/17 with Dr. Jacqulyn Bath with epidural cather for post-op pain    Daily Hospital Course  Chest tube removed this morning and patient feels much better. Had T of 38.1 overnight.     Epidural Catheter  Level: T8/9  LOR:  9cm  Catheter at skin: 14cm  Catheter Start Date: 03/29/17  Postop/Catheter Day: 4    Analgesia Evaluation:  Pain at rest: 0/10  Pain with activity: 7/10    Infusion: 0.15 Bupuivacaine  Continuous Infusion Rate: 66ml/hour  PCEA Settings: 2ml every 30  minutes    Additional opioid and non-opioid analgesics:   Oxycodone 5 mg x 1  Ketorlac 15 mg QID  Hydromorphone PCA: 7.2 mg  Lyrica 200 BID  Tylenol 1000 mg TID     Complications:  Pruritis: no  Urinary Retention: urinary catheter  Nausea: No  Vomiting: No  Sedation: No  Respiratory Depression: No    Physical Exam  Temp:  [37 ??C-38.1 ??C] 38 ??C  Heart Rate:  [91-105] 104  Resp:  [16-24] 20  BP: (120-145)/(66-89) 145/80  MAP (mmHg):  [76-105] 98  SpO2:  [91 %-95 %] 95 %    GEN: Patient sitting in chair, in minimum distress.  NG is not present.  CHEST: Unlabored breathing, supplemental oxygen is present.  Chest tube is not present.  SKIN: epidural dressing clean, dry, intact; no erythema at insertion site, no fluctuance or tenderness noted  GENITOURINARY: foley catheter is present  NEUROLOGIC: patient moves all extremities, 5/5 strength in lower extremities, alert and oriented to person, place and time; other deficits noted: None    Lab Results   Component Value Date    WBC 6.4 04/02/2017    RBC 3.58 (L) 04/02/2017    HGB 9.4 (L) 04/02/2017    HCT 28.9 (L) 04/02/2017    MCV 80.8 04/02/2017    MCH 26.3 04/02/2017    MCHC 32.6 04/02/2017    RDW 15.8 (H) 04/02/2017    PLT 376 04/02/2017     Lab Results   Component Value Date    PT 11.1 03/29/2017    PT 11.5 03/28/2017    PT 14.1 (H) 11/29/2016    INR 0.97 03/29/2017    INR 1.01 03/28/2017    INR 1.20 11/29/2016

## 2017-04-02 NOTE — Unmapped (Signed)
Problem: Cystic Fibrosis (Adult)  Goal: Signs and Symptoms of Listed Potential Problems Will be Absent, Minimized or Managed (Cystic Fibrosis)  Signs and symptoms of listed potential problems will be absent, minimized or managed by discharge/transition of care (reference Cystic Fibrosis (Adult) CPG).   Outcome: Progressing  Patient was compliant with all his scheduled inhaled medications today. However, pt has been refusing to perform airway clearance due to pain. Pt on 3 L @ [place] and tolerating well, will continue to monitor.

## 2017-04-02 NOTE — Unmapped (Signed)
Problem: Breathing Pattern Ineffective (Adult)  Goal: Effective Oxygenation/Ventilation  Patient will demonstrate the desired outcomes by discharge/transition of care.  Outcome: Progressing  Patient did well with doing all of their treatments today. Patient did their airway clearance without complications.

## 2017-04-02 NOTE — Unmapped (Signed)
SURGERY PROGRESS NOTE    Admit Date: 03/18/2017, Hospital Day: 16  Hospital Service: Surg Thoracic (SRT)  Attending: Cherie Dark, MD    Assessment     Christian Young is a 31 y.o. male with a PMH of cystic fibrosis who is s/p RUL lobectomy on 03/29/17 for severe bronchiectasis.     Interval events  Chest tube pulled today, no acute events overnight    Plan     Neuro/Pain:  -Epidural in place, will pursue epidural cap trial for potential removal considering intermittent fevers without source and improved pain control  -Dilaudid PCA, Dilaudid IV .5mg  PRN for severe pain  -Oxycodone 5mg  Q4 PRN for pain  -Acetominophen 650mg  Q4 PRN for pain  -Pregabalin 200mg  PO 2X daily    Psych:  -lamotrigine 25mg  PO 2X daily    Cardio: Hemodynamically stable  -COREG 3.125mg  PO 2X daily  -Lisinopril 10mg  PO daily    Vascular:  -Hold home Xarelto in short term post-operative period until epidural is removed    Pulmonary:  -Right chest to pulled, f/u cxr [  ], IS, OOB  -albuterol 2.5mg  nebulizer solution Q6  -symbicort 2 puffs 2X daily  -dornase alfa 2.5mg  nebulizer solution daily  -fluticasone 2 sprays daily    FEN/GI:  Reg diet  -Home creon, Symdeko  -Bowel regimen: colace 100mg  PO 2X daily, Miralax 17g TID    GU/Renal:  -Continue foley until epidural is removed  -Lasix 20 iv    Heme/ID:  -Will discuss with Pulm appropriate abx regimen [  ]  -azithromycin 500mg  PO daily  -Clofazimine 100mg  PO daily (patient supplied)  -DVT prophy 7,500 units heparin subcutaneously    Endocrine:  -Home creon    Immune:   -Zyrtec 10mg  PO daily  -Singulair 10mg  PO daily    Disposition: Stepdown status.     Please page Thoracic Surgery with questions or concerns.      Subjective   Doing better today from a pain perspective.     Objective     Vitals:   Temp:  [36.8 ??C-38.1 ??C] 37.2 ??C  Heart Rate:  [91-105] 91  Resp:  [16-24] 16  BP: (117-141)/(61-73) 120/73  MAP (mmHg):  [76-92] 83  SpO2:  [91 %-95 %] 94 %    Intake/Output last 24 hours:  I/O last 3 completed shifts:  In: 927.2 [P.O.:360; I.V.:17.2; IV Piggyback:550]  Out: 3200 [Urine:3060; Chest Tube:140]  Chest tube without air leak on water seal.  Serosanguinous output.    Physical Exam:    -General:  Appears uncomfortable but in no apparent distress.    -Neurological: Alert and oriented x3. Moves all 4 extremities spontaneously.   -Cardiovascular: Regular rate and rhythm.  -Pulmonary: Breathing limited due to pain.  Easily audible congestion with limited ability to cough.  Coarse breath sounds bilaterally, particularly in the lower lung fields. No accessory muscle use.  -Abdomen: Soft, non-tender, non-distended. No rebound or guarding.  -Extremities: Warm, well perfused, normal skin turgor.    -----------------------------------------------------    Data Review:  Lab Results   Component Value Date    WBC 6.4 04/02/2017    HGB 9.4 (L) 04/02/2017    HCT 28.9 (L) 04/02/2017    PLT 376 04/02/2017       Lab Results   Component Value Date    NA 134 (L) 04/02/2017    K 4.2 04/02/2017    CL 96 (L) 04/02/2017    CO2 30.0 04/02/2017    BUN 16 04/02/2017  CREATININE 0.82 04/02/2017    GLU 141 (H) 04/02/2017    CALCIUM 8.3 (L) 04/02/2017    MG 1.8 04/02/2017    PHOS 4.7 04/02/2017       Lab Results   Component Value Date    BILITOT 0.3 03/21/2017    PROT 6.5 03/21/2017    ALBUMIN 3.6 03/21/2017    ALT 79 (H) 03/21/2017    AST 36 03/21/2017    ALKPHOS 65 03/21/2017       Lab Results   Component Value Date    INR 0.97 03/29/2017       Imaging:  Reviewed.

## 2017-04-02 NOTE — Unmapped (Signed)
Problem: Patient Care Overview  Goal: Plan of Care Review  Outcome: Progressing   04/02/17 0634   OTHER   Plan of Care Reviewed With patient   Plan of Care Review   Progress improving     Goal: Discharge Needs Assessment   04/02/17 0634   OTHER   Equipment Currently Used at Home respiratory supplies   Discharge Needs Assessment   Concerns to be Addressed adjustment to diagnosis/illness;home safety;compliance issue   Readmission Within the Last 30 Days planned readmission   Patient/Family Anticipated Services at Transition home health care   Equipment Needed After Discharge respiratory supplies   Current Discharge Risk chronically ill       Problem: Cystic Fibrosis (Adult)  Goal: Signs and Symptoms of Listed Potential Problems Will be Absent, Minimized or Managed (Cystic Fibrosis)  Signs and symptoms of listed potential problems will be absent, minimized or managed by discharge/transition of care (reference Cystic Fibrosis (Adult) CPG).   Outcome: Progressing   04/02/17 0634   Cystic Fibrosis (Adult)   Problems Assessed (Cystic Fibrosis) pulmonary/respiratory complications;situational response;infection;malabsorption/maldigestion   Problems Present (Cystic Fibrosis) situational response;pulmonary/respiratory complications;infection       Problem: VTE, DVT and PE (Adult)  Goal: Signs and Symptoms of Listed Potential Problems Will be Absent, Minimized or Managed (VTE, DVT and PE)  Signs and symptoms of listed potential problems will be absent, minimized or managed by discharge/transition of care (reference VTE, DVT and PE (Adult) CPG).   Outcome: Progressing   04/02/17 0634   VTE, DVT and PE (Adult)   Problems Assessed (VTE, DVT, PE) all   Problems Present (VTE, DVT, PE) deep vein thrombosis       Problem: Pain, Chronic (Adult)  Goal: Identify Related Risk Factors and Signs and Symptoms  Related risk factors and signs and symptoms are identified upon initiation of Human Response Clinical Practice Guideline (CPG). Outcome: Progressing   04/02/17 0634   Pain, Chronic (Adult)   Related Risk Factors (Chronic Pain) knowledge deficit;disease process;surgery   Signs and Symptoms (Chronic Pain) verbalization of pain/discomfort for a prolonged time period;fatigue/weakness       Problem: Fall Risk (Adult)  Goal: Identify Related Risk Factors and Signs and Symptoms  Related risk factors and signs and symptoms are identified upon initiation of Human Response Clinical Practice Guideline (CPG).   Outcome: Progressing   04/02/17 0634   Fall Risk (Adult)   Related Risk Factors (Fall Risk) fatigue/slow reaction;inadequate lighting;polypharmacy;objects hard to reach;environment unfamiliar   Signs and Symptoms (Fall Risk) presence of risk factors       Problem: Infection, Risk/Actual (Adult)  Goal: Identify Related Risk Factors and Signs and Symptoms  Related risk factors and signs and symptoms are identified upon initiation of Human Response Clinical Practice Guideline (CPG).   Outcome: Progressing   04/02/17 0634   Infection, Risk/Actual (Adult)   Related Risk Factors (Infection, Risk/Actual) skin integrity impairment;chronic illness/condition;prolonged hospitalization   Signs and Symptoms (Infection, Risk/Actual) heart rate increase;pain;weakness       Problem: Breathing Pattern Ineffective (Adult)  Goal: Identify Related Risk Factors and Signs and Symptoms  Related risk factors and signs and symptoms are identified upon initiation of Human Response Clinical Practice Guideline (CPG).   Outcome: Progressing   04/02/17 0634   Breathing Pattern Ineffective (Adult)   Related Risk Factors (Breathing Pattern Ineffective) infection;surgery;underlying condition   Signs and Symptoms (Breathing Pattern Ineffective) activity intolerance;cough ineffective;breath sounds abnormal       Comments: Drowsy and oriented x4. Lungs sounds diminished.  Weak cough, IS use encouraged, declined vest therapy. Pain managed with oxycodone, pca dilaudid and epidural. CT to waterseal, minimal output, POC reviewed with understanding.

## 2017-04-02 NOTE — Unmapped (Signed)
Aminoglycoside Follow-Up Pharmacy Note    Christian Young is a 31 y.o. male on amikacin 860 mg IV  every 24 hours for CF exacerbation and M. abscessus.  Currently on day 13 of therapy.    CF extended infusion (1 hour):  Goal peak: 35-45 mg/L   Goal trough: <1 mg/L for 6-8 hour drug free interval    Wt Readings from Last 3 Encounters:   04/02/17 (!) 127.1 kg (280 lb 3.3 oz)   02/21/17 (!) 115 kg (253 lb 9.6 oz)   02/15/17 (!) 118.8 kg (261 lb 14.5 oz)     Lab Results   Component Value Date    BUN 16 04/02/2017    BUN 17 04/01/2017    BUN 21 03/31/2017    BUN 20 03/31/2017    BUN 17 03/30/2017    CREATININE 0.82 04/02/2017    CREATININE 0.79 04/01/2017    CREATININE 0.99 03/31/2017    CREATININE 0.88 03/31/2017    CREATININE 0.79 03/30/2017       Pharmacokinetic Serum Concentrations:  Measured peak = 20.3 mcg/mL (@ 0825) ( ~2.5 hours post dose)  Calculated peak = 37 mcg/mL     Measured 10 hour random = 2.3 mcg/mL (@1532 ) (~ 9 hours after end of dose)  Calculated trough = 0.03 mcg/mL     Patient-Specific Pharmacokinetic Parameters:  Vd = 21.4 L, ke = 0.31 hr-1 (Calculated 04/02/17)    PLAN:  Based on levels, recommend increase to 1000 mg IV every 24 hours given the severity of concern for gross spillage and contamination of the pleural space with M. Abscessus. The proposed new regimen would yield a Peak of 40 mcg/mL (more in the middle of the range) to optimize the clinical efficacy against microorganism.    Pharmacy will continue to monitor for changes in renal function, toxicity, and efficacy and order additional levels as needed. Please page service pharmacist with questions/clarifications.    Gwendalyn Ege, PharmD, BCPS  Cardiothoracic ICU Clinical Pharmacist,  Vascular & Thoracic Surgery Services,  [facility] Lung Transplant Team  VM: [phone]  P: [phone]

## 2017-04-03 LAB — BASIC METABOLIC PANEL
ANION GAP: 8 mmol/L — ABNORMAL LOW (ref 9–15)
BLOOD UREA NITROGEN: 16 mg/dL (ref 7–21)
BUN / CREAT RATIO: 21
CALCIUM: 8.4 mg/dL — ABNORMAL LOW (ref 8.5–10.2)
CHLORIDE: 91 mmol/L — ABNORMAL LOW (ref 98–107)
CREATININE: 0.77 mg/dL (ref 0.70–1.30)
EGFR MDRD AF AMER: >=60 mL/min/{1.73_m2} (ref >=60)
EGFR MDRD NON AF AMER: >=60 mL/min/{1.73_m2} (ref >=60)
GLUCOSE RANDOM: 126 mg/dL (ref 65–179)
POTASSIUM: 4.4 mmol/L (ref 3.5–5.0)
SODIUM: 132 mmol/L — ABNORMAL LOW (ref 135–145)

## 2017-04-03 LAB — URINALYSIS
GLUCOSE UA: NEGATIVE
KETONES UA: NEGATIVE
NITRITE UA: NEGATIVE
PH UA: 5.5 (ref 5.0–9.0)
RBC UA: 6 /HPF — ABNORMAL HIGH (ref <3)
SPECIFIC GRAVITY UA: 1.02 (ref 1.003–1.030)
SQUAMOUS EPITHELIAL: <1 /HPF (ref 0–5)
WBC UA: 2 /HPF — ABNORMAL HIGH (ref <2)

## 2017-04-03 LAB — PHOSPHORUS
PHOSPHORUS: 5 mg/dL — ABNORMAL HIGH (ref 2.9–4.7)
Phosphate:MCnc:Pt:Ser/Plas:Qn:: 5 — ABNORMAL HIGH

## 2017-04-03 LAB — CBC
HEMATOCRIT: 29.2 % — ABNORMAL LOW (ref 41.0–53.0)
HEMOGLOBIN: 9.6 g/dL — ABNORMAL LOW (ref 13.5–17.5)
MEAN CORPUSCULAR HEMOGLOBIN CONC: 32.9 g/dL (ref 31.0–37.0)
MEAN CORPUSCULAR HEMOGLOBIN: 26.6 pg (ref 26.0–34.0)
MEAN CORPUSCULAR VOLUME: 80.9 fL (ref 80.0–100.0)
MEAN PLATELET VOLUME: 6.8 fL — ABNORMAL LOW (ref 7.0–10.0)
PLATELET COUNT: 371 10*9/L (ref 150–440)
RED BLOOD CELL COUNT: 3.61 10*12/L — ABNORMAL LOW (ref 4.50–5.90)
RED CELL DISTRIBUTION WIDTH: 15.4 % — ABNORMAL HIGH (ref 12.0–15.0)
WBC ADJUSTED: 6.9 10*9/L (ref 4.5–11.0)

## 2017-04-03 LAB — HEMOGLOBIN: Lab: 9.6 — ABNORMAL LOW

## 2017-04-03 LAB — MAGNESIUM: Magnesium:MCnc:Pt:Ser/Plas:Qn:: 1.8

## 2017-04-03 LAB — WBC UA: Lab: 2 — ABNORMAL HIGH

## 2017-04-03 LAB — BUN / CREAT RATIO: Urea nitrogen/Creatinine:MRto:Pt:Ser/Plas:Qn:: 21

## 2017-04-03 NOTE — Unmapped (Signed)
SURGERY PROGRESS NOTE    Admit Date: 03/18/2017, Hospital Day: 17  Hospital Service: Surg Thoracic (SRT)  Attending: Cherie Dark, MD    Assessment     Christian Young is a 31 y.o. male with a PMH of cystic fibrosis who is s/p RUL lobectomy on 03/29/17 for severe bronchiectasis. Now Post op day # 5 from right upper lobectomy.      Interval events  Chest tube pulled yesterday. CXR unremarkable afterwards. Spiked a fever last night, spurring blood cultures and U/A this morning.    Plan     Neuro/Pain:  -Epidural in place, Epidural pulled 8/21 and transitioned to PCA. PCA weaning today as we decrease dose from 0.4 to 0.2 mg boluses. Increased oxy to 10-15.  -Dilaudid PCA  -Oxycodone 10-15 mg Q4 PRN for pain  -Acetominophen 650mg  Q4 PRN for pain  -Pregabalin 200mg  PO 2X daily  - No toradol per pulmonology.    Psych:  -lamotrigine 25mg  PO 2X daily    Cardio: Hemodynamically stable  -COREG 3.125mg  PO 2X daily  -Lisinopril 10mg  PO daily    Vascular:  -Hold home Xarelto in short term post-operative period until epidural is removed    Pulmonary:  IS, OOB  -albuterol 2.5mg  nebulizer solution Q6  -symbicort 2 puffs 2X daily  -dornase alfa 2.5mg  nebulizer solution daily  -fluticasone 2 sprays daily    FEN/GI:  Reg diet  -Home creon, Symdeko  -Bowel regimen: colace 100mg  PO 2X daily, Miralax 17g TID    GU/Renal:  -Continue foley until epidural is removed  -Lasix 20 iv    Heme/ID:  - Pulm on board managing antibiotics.  - Spiked a fever of 38.6 last night   - Blood cx X2 and U/A ordered 8/22. [ ]  F/U  -azithromycin 500mg  PO daily  -Clofazimine 100mg  PO daily (patient supplied)  -DVT prophy 7,500 units heparin subcutaneously    Endocrine:  -Home creon    Immune:   -Zyrtec 10mg  PO daily  -Singulair 10mg  PO daily    Disposition: Stepdown status. Possibly pending transfer back to [facility].    Please page Thoracic Surgery with questions or concerns.      Subjective   Still in quite a bit of pain. It has never been less than an 8/10 when we ask him, but he seems in no apparent distress. Rash is stable and benedryl is helping with itching. Planning on moving more towards controlling pain with PO meds and decreasing PCA bolus amounts.    Objective     Vitals:   Temp:  [36.8 ??C-38.6 ??C] 36.8 ??C  Heart Rate:  [84-119] 105  Resp:  [12-21] 20  BP: (126-159)/(61-97) 133/95  MAP (mmHg):  [79-111] 104  SpO2:  [90 %-99 %] 90 %    Intake/Output last 24 hours:  I/O last 3 completed shifts:  In: 1679.2 [P.O.:1360; I.V.:69.2; IV Piggyback:250]  Out: 4830 [Urine:4760; Chest Tube:70]  Chest tube pulled 8/21.    Physical Exam:    -General:  Appears uncomfortable but in no apparent distress sitting up in his chair with his mom in his room.  -Neurological: Alert and oriented x3. Moves all 4 extremities spontaneously.   -Cardiovascular: Regular rate and rhythm.  -Pulmonary: Breathing limited due to pain.  Easily audible congestion with limited ability to cough.  Coarse breath sounds bilaterally, particularly in the lower lung fields. No accessory muscle use.  -Abdomen: Soft, non-tender, non-distended. No rebound or guarding.  -Extremities: Warm, well perfused, normal skin turgor.  -  Skin: Rash on lateral right axillary region, unchanged from the last couple of days. Erythematous and elevated.    -----------------------------------------------------    Data Review:  Lab Results   Component Value Date    WBC 6.9 04/03/2017    HGB 9.6 (L) 04/03/2017    HCT 29.2 (L) 04/03/2017    PLT 371 04/03/2017       Lab Results   Component Value Date    NA 132 (L) 04/03/2017    K 4.4 04/03/2017    CL 91 (L) 04/03/2017    CO2 33.0 (H) 04/03/2017    BUN 16 04/03/2017    CREATININE 0.77 04/03/2017    GLU 126 04/03/2017    CALCIUM 8.4 (L) 04/03/2017    MG 1.8 04/03/2017    PHOS 5.0 (H) 04/03/2017       Lab Results   Component Value Date    BILITOT 0.3 03/21/2017    PROT 6.5 03/21/2017    ALBUMIN 3.6 03/21/2017    ALT 79 (H) 03/21/2017    AST 36 03/21/2017    ALKPHOS 65 03/21/2017       Lab Results   Component Value Date    INR 0.97 03/29/2017       Imaging:  Reviewed.    Shelly Rubenstein, MD  Department of Surgery, PGY1  P: [phone]  SRT P: [phone]

## 2017-04-03 NOTE — Unmapped (Signed)
Problem: VTE, DVT and PE (Adult)  Goal: Signs and Symptoms of Listed Potential Problems Will be Absent, Minimized or Managed (VTE, DVT and PE)  Signs and symptoms of listed potential problems will be absent, minimized or managed by discharge/transition of care (reference VTE, DVT and PE (Adult) CPG).   Outcome: Progressing      Problem: Pain, Chronic (Adult)  Goal: Identify Related Risk Factors and Signs and Symptoms  Related risk factors and signs and symptoms are identified upon initiation of Human Response Clinical Practice Guideline (CPG).   Outcome: Progressing    Goal: Acceptable Pain/Comfort Level and Functional Ability  Patient will demonstrate the desired outcomes by discharge/transition of care.   Outcome: Progressing      Problem: Fall Risk (Adult)  Goal: Identify Related Risk Factors and Signs and Symptoms  Related risk factors and signs and symptoms are identified upon initiation of Human Response Clinical Practice Guideline (CPG).   Outcome: Progressing    Goal: Absence of Fall  Patient will demonstrate the desired outcomes by discharge/transition of care.   Outcome: Progressing      Comments: Patient resting in bed with eyes closed, respirations even and unlabored on O2 3L/min via [place], no s/s of acute distress, VSS, SR on monitor, has had issues with pain control at surgical site, one time dose of 5mg  oxycodone given for breakthrough pain, pca button within reach, setting per orders, reports pain improving, UOP appropriate, tolerating diet, surgical incisions clean/dry/intact, all safety precautions in place, call bell within reach, will continue to monitor

## 2017-04-03 NOTE — Unmapped (Signed)
Department of Anesthesiology  Pain Division      Acute Pain Service Follow-Up Note- Epidural      Assessment and Plan    31 y.o. yo with PMH of CF s/p R thoracotomy and RU lobectomy on 8/17 with Dr. Jacqulyn Bath with epidural cather for post-op pain. Epidural removed on 8/21 due to concern for infection after T 38.1. Patient had Tmax 38.6 over last 24 hours, WBC 6.9 from 6.4. Patient has had increased pain after epidural removed, but still able to get out of bed and breathe comfortably. Can consider increasing oxycodone.    Additional pain/sedation medications: Being managed by the primary service.  See changes below.     Recommendations:  - Recommend increasing oxycodone to 10-15 mg q4 hr prn  - Continue dilaudid PCA to 0.4 mg q8 minutes  - Consider Toradol (s/p 3 days) if need more coverage and renal function adequate  - Continue acetamionphen to 1 g PO TID  - Continue Lyrica 200 mg BID    We will sign off at this time.  Please contact the APS if we can be of further assistance with pain control.  Contact the Acute Pain Service first at [phone] with questions or concerns 24 hours a day.  If unable to reach, please contact on call Anesthesiology resident at [phone].     If applicable, naloxone available for sedation or respiratory depression.  If applicable, nalbuphine available for refractory pruritis.  Call APS for additional concerns.  No anesthesia/epidural need for foley catheter.  Contact APS before starting any anticoagulant other than [place] heparin.    CPT daily management of epidural 47829  Diagnosis: chest pain      History    Patient is a 31 y.o. yowith PMH of CF s/p R thoracotomy and RU lobectomy on 8/17 with Dr. Jacqulyn Bath with epidural cather for post-op pain    Daily Hospital Course  Patient had T of 38.6 yesterday after epidural removed. Patient reports some worsening of his pain after effects of epidural wore off. Patient received an extra one-time dose of oxycodone overnight    Epidural Catheter  Level: T8/9  LOR:  9cm  Catheter at skin: 14cm  Catheter dates 8/17-8/21    Analgesia Evaluation:  Pain at rest: 0/10  Pain with activity: 9/10    Additional opioid and non-opioid analgesics:   Oxycodone: 65 mg  Hydromorphone PCA: 22 mg  Lyrica 200 BID  Tylenol 1000 mg TID     Complications:  Pruritis: no  Urinary Retention: urinary catheter  Nausea: No  Vomiting: No  Sedation: No  Respiratory Depression: No    Physical Exam  Temp:  [36.9 ??C-38.6 ??C] 37.1 ??C  Heart Rate:  [84-119] 111  Resp:  [12-21] 20  BP: (126-159)/(61-97) 143/87  MAP (mmHg):  [79-111] 100  SpO2:  [95 %-99 %] 95 %    GEN: Patient sitting in chair, in minimum distress.  NG is not present.  CHEST: Unlabored breathing, supplemental oxygen is present.  Chest tube is not present.  GENITOURINARY: foley catheter is present  NEUROLOGIC: patient moves all extremities, 5/5 strength in lower extremities, alert and oriented to person, place and time; other deficits noted: None    Lab Results   Component Value Date    WBC 6.9 04/03/2017    RBC 3.61 (L) 04/03/2017    HGB 9.6 (L) 04/03/2017    HCT 29.2 (L) 04/03/2017    MCV 80.9 04/03/2017    MCH 26.6 04/03/2017  MCHC 32.9 04/03/2017    RDW 15.4 (H) 04/03/2017    PLT 371 04/03/2017     Lab Results   Component Value Date    PT 11.1 03/29/2017    PT 11.5 03/28/2017    PT 14.1 (H) 11/29/2016    INR 0.97 03/29/2017    INR 1.01 03/28/2017    INR 1.20 11/29/2016

## 2017-04-03 NOTE — Unmapped (Signed)
Nebs given as ordered except for around lunch time when he really wanted to eat.  Otherwise no distress noted.  Acapella neb initiated this evening for mucus clearance.  Christian Young did okay with this but complained of pain afterwards.

## 2017-04-03 NOTE — Unmapped (Signed)
Problem: Patient Care Overview  Goal: Plan of Care Review  Outcome: Progressing  Pt aox4, with some episodes of drowsiness   04/02/17 1909   OTHER   Plan of Care Reviewed With patient;family;mother   Plan of Care Review   Progress improving   , pt pain well controlled with the given pain meds and PCA. Pt with low grade fever, the team aware, antibiotics given per order. Pt ambulates in the hallway with good tolerance. The foley dcd this evening, pt has not voided yet, po intake encouraged.   Goal: Individualization and Mutuality  Outcome: Progressing   03/21/17 1619 03/24/17 1524   OTHER   How to Address Anxieties/Fears --  Reassurance about current lung fxn, communication w./ other cf patient with similar experience.    Individualization   Patient Specific Goals (Include Timeframe) To engage in airway clearence despite discomfort and pain --    Patient Specific Interventions Offer pain medication and encourage airway clearence and walkiing. --      Goal: Interprofessional Rounds/Family Conf  Outcome: Progressing      Problem: VTE, DVT and PE (Adult)  Goal: Signs and Symptoms of Listed Potential Problems Will be Absent, Minimized or Managed (VTE, DVT and PE)  Signs and symptoms of listed potential problems will be absent, minimized or managed by discharge/transition of care (reference VTE, DVT and PE (Adult) CPG).   Outcome: Progressing      Problem: Pain, Chronic (Adult)  Goal: Identify Related Risk Factors and Signs and Symptoms  Related risk factors and signs and symptoms are identified upon initiation of Human Response Clinical Practice Guideline (CPG).   Outcome: Progressing   04/02/17 0634   Pain, Chronic (Adult)   Related Risk Factors (Chronic Pain) knowledge deficit;disease process;surgery   Signs and Symptoms (Chronic Pain) verbalization of pain/discomfort for a prolonged time period;fatigue/weakness     Goal: Acceptable Pain/Comfort Level and Functional Ability  Patient will demonstrate the desired outcomes by discharge/transition of care.   Outcome: Progressing   04/01/17 1906   Pain, Chronic (Adult)   Acceptable Pain/Comfort Level and Functional Ability making progress toward outcome       Problem: Infection, Risk/Actual (Adult)  Goal: Identify Related Risk Factors and Signs and Symptoms  Related risk factors and signs and symptoms are identified upon initiation of Human Response Clinical Practice Guideline (CPG).   Outcome: Progressing   04/02/17 0634   Infection, Risk/Actual (Adult)   Related Risk Factors (Infection, Risk/Actual) skin integrity impairment;chronic illness/condition;prolonged hospitalization   Signs and Symptoms (Infection, Risk/Actual) heart rate increase;pain;weakness

## 2017-04-03 NOTE — Unmapped (Signed)
Problem: Breathing Pattern Ineffective (Adult)  Goal: Identify Related Risk Factors and Signs and Symptoms  Related risk factors and signs and symptoms are identified upon initiation of Human Response Clinical Practice Guideline (CPG).   Pt compliant with all scheduled inhaled meds this shift and airway clearance. Currently on 2 lpm [place] with adequate saturation and no visible signs of distress.

## 2017-04-03 NOTE — Unmapped (Signed)
Transfer Note    Assessment/Plan:    Principal Problem:    Cystic fibrosis with pulmonary exacerbation (CMS-HCC)  Active Problems:    Essential hypertension    History of DVT (deep vein thrombosis)    Chronic pansinusitis    Pancreatic insufficiency    Mycobacterium abscessus infection  Resolved Problems:    * No resolved hospital problems. *   Malnutrition Evaluation as performed by RD, LDN: Patient does not meet AND/ASPEN criteria for malnutrition at this time (03/19/17 1404)    Pressure Ulcer(s)    Active Pressure Ulcer     None                 Christian Young is a 31 y.o. male that presented to [facility] with Cystic fibrosis with pulmonary exacerbation (CMS-HCC).    Christian Young??is a 31 y.o.??y/o male??with PMHx of late diagnosis CF [id number] mutation), pancreatic insufficiency, CF sinus disease, HTN, depression, and DVTs on chronic anticoagulation who??presented to [facility] with Cystic fibrosis with pulmonary exacerbation (CMS-HCC). He was treated for Brochiectasis exacerbation with Ceftazidime, Amikacin, and inhaled tobramycin, his home Clofazamine, and azithromycin were continued. He Received airway clearance with HTS 7%, albuterol, and chest vest. Due to focal nature of CF involvement he underwent right upper lobe resection with CT surgery on 8/17. He had a right apical pneumothorax and had a chest tube placed 8/18. He had persistent post op pain requiring an epidural infusion placed on 8/18, discontinued on 8/21. He also received a dilaudid PCA, oxycodone, Acetaminophen, pregabalin, and toradol for pain.  His chest tube was pulled on 08/21. He has had recurrent fevers raising concern for infection. He was transferred to Med [facility] for further care.   ??  Fevers s/p RUL lobectomy, concern for effusion : Small right pleural effusion on U/S per IP, too small to drain.  - CTM   - Abx as below.     CF with Acute Exacerbation of Bronchiectasis. Presented with increased SOB/DOE, worsening cough/sputum production and small-volume hemoptysis consistent with exacerbation of bronchiectasis. Sputum culture from 03/18/17 with smooth Pseudumonas (S: tobra, Levo/Cipro; I: Aztreonam, Zosyn; R: amikacin, cefepime, ceftazidime, imipenem/meropenem). Of note he is also being treated for macrolide-sensitive M abscessus with azithro, clofazimine and inhaled amikacin, tentative end date of 11/2017 per his primary pulmonologist, Dr. Koren Shiver', note. Additionally, during this admission his sputum culture from 8/17 was positive for stenotrophomonas maltophilia (R: Ceftazidime, S: Levofloxacin, Minocycline, and Trimethoprim + Sulfamethoxazole). Of note, Mr. Priebe has had ~8 admissions for exacerbations requiring IV antibiotics the past year. CT demonstrates significant scarring and volume loss in RUL, and perfusion scan demonstrates decreased perfusion to this lobe. Given concern his RUL was likely contributing to recurrent infections without contributing to ventilation/perfusion, he underwent RUL lobectomy 8/17.   ?? Continue Zosyn IV, pseudomonal dosing  ?? Continue inhaled Tobramycin  ?? Continue Minocycline  ?? Continue Amikacin IV, to protect against surgical site incision, then will likely transition back to long-term inhaled  ?? Continue home azithromycin, clofazimine given M abscessus. Holding inhaled Amikacin while on IV Amikacin.  ?? Continue Symdeko (CFTR Modulator)  ?? For airway clearance -- continue hypertonic saline nebs 7% qid, albuterol nebs qid, pulmozyme, and Aerobika or flutter valve. Okay to hold vest therapy given post-op with chest tube.  ??  CF-related GI manifestations. KUB shows large stool burden  - Golytely  - SMOG enema  - Continue Creon with meals/snacks and Vitamins (MVW Complete for CF)  - Wean narcotics as able  ??  Acute on Chronic Pain.  As discussed above, minimize narcotics as reasonably able to and ensure bowel regimen  -Dilaudid PCA 0.2 mg boluses.  -Oxycodone 10-15 mg Q4 PRN for pain  -Acetominophen 1000mg  TID  -Pregabalin 200mg  PO 2X daily  - Will plan to wean narcotics as able    CF-related Chronic SInusitis  - Continue home Flonase, Zyrtec, Singulair,   ??  History of DVTs:??Per history may be both provoked and unprovoked, on chronic Xarelto 20mg  at home. Has not taken Xarelto since mid last week 2/2 inability to fill prescription. Held xarelto in setting of hemoptysis, changed to Heparin SQ on 8/6 at DVT ppx dosing along with not restarting xarelto in light of possible surgery.  - Continue heparin  ??  HTN:??Patient on home Lisinopril 10mg  daily and Metoprolol succinate 25mg  daily. BPs running into [id number] s/80s-90s in the afternoon. Also has baseline tachycardia in 90s-115.   -Coreg 3.125mg  po BID   - PRN hydralazine  ??  Restless legs:  - Continue home mirapex 0.25 mg BID    ??  Depression:??Doing well, denies SI at this time  - Continue home lamictal and trazodone  ??  Rash, R Axilla erythematous with satellite lesions c/w yeast  - Nystatin    Code Status:  Full Code  ___________________________________________________________________    Subjective:   Has persistent pain in incision and chest tube site. Has been having diarrhea. Had fever last night.     Labs/Studies:  Labs and Studies from the last 24hrs per EMR and Reviewed    Objective:  Temp:  [36.8 ??C-37.7 ??C] 36.8 ??C  Heart Rate:  [84-115] 105  Resp:  [12-21] 18  BP: (131-147)/(61-97) 133/95  SpO2:  [90 %-99 %] 95 %    GEN: NAD, lying in bed  EYES: EOMI  ENT: MMM  CV: tachycardic, no MRG  PULM: Diminished R base, crackles throughout, normal WOB on 4L per [place]  ABD: soft, NT/ND, +BS  EXT: No edema  SKIN: R chest tube dressing c/d/i, surgical incision c/d/i no obvious drainage, R axilla erythematous macular rash with satellite lesions

## 2017-04-04 LAB — BASIC METABOLIC PANEL
ANION GAP: 10 mmol/L (ref 9–15)
BLOOD UREA NITROGEN: 16 mg/dL (ref 7–21)
BUN / CREAT RATIO: 21
CALCIUM: 8.2 mg/dL — ABNORMAL LOW (ref 8.5–10.2)
CHLORIDE: 90 mmol/L — ABNORMAL LOW (ref 98–107)
CO2: 31 mmol/L — ABNORMAL HIGH (ref 22.0–30.0)
EGFR MDRD AF AMER: >=60 mL/min/{1.73_m2} (ref >=60)
EGFR MDRD NON AF AMER: >=60 mL/min/{1.73_m2} (ref >=60)
GLUCOSE RANDOM: 154 mg/dL (ref 65–179)
POTASSIUM: 4.2 mmol/L (ref 3.5–5.0)
SODIUM: 131 mmol/L — ABNORMAL LOW (ref 135–145)

## 2017-04-04 LAB — RED CELL DISTRIBUTION WIDTH: Lab: 15.7 — ABNORMAL HIGH

## 2017-04-04 LAB — OSMOLALITY MEASURED: Osmolality:Osmol:Pt:Ser/Plas:Qn:: 278

## 2017-04-04 LAB — CBC
HEMATOCRIT: 28.9 % — ABNORMAL LOW (ref 41.0–53.0)
HEMOGLOBIN: 9.7 g/dL — ABNORMAL LOW (ref 13.5–17.5)
MEAN CORPUSCULAR HEMOGLOBIN CONC: 33.6 g/dL (ref 31.0–37.0)
MEAN CORPUSCULAR HEMOGLOBIN: 26.9 pg (ref 26.0–34.0)
MEAN PLATELET VOLUME: 7.3 fL (ref 7.0–10.0)
RED BLOOD CELL COUNT: 3.62 10*12/L — ABNORMAL LOW (ref 4.50–5.90)
RED CELL DISTRIBUTION WIDTH: 15.7 % — ABNORMAL HIGH (ref 12.0–15.0)
WBC ADJUSTED: 8.7 10*9/L (ref 4.5–11.0)

## 2017-04-04 LAB — AMIKACIN LEVEL: AMIKACIN RANDOM: 2.7 ug/mL

## 2017-04-04 LAB — PHOSPHORUS: Phosphate:MCnc:Pt:Ser/Plas:Qn:: 4.4

## 2017-04-04 LAB — OSMOLALITY URINE: Lab: 677

## 2017-04-04 LAB — AMIKACIN RANDOM
Amikacin^random:MCnc:Pt:Ser/Plas:Qn:: 17.8
Amikacin^random:MCnc:Pt:Ser/Plas:Qn:: 2.7

## 2017-04-04 LAB — SODIUM URINE: Lab: 144

## 2017-04-04 LAB — CO2: Carbon dioxide:SCnc:Pt:Ser/Plas:Qn:: 31 — ABNORMAL HIGH

## 2017-04-04 LAB — MAGNESIUM: Magnesium:MCnc:Pt:Ser/Plas:Qn:: 1.7

## 2017-04-04 NOTE — Unmapped (Signed)
Problem: Patient Care Overview  Goal: Plan of Care Review  Outcome: Progressing  Pt remained stable entire shift. Mother at bedside. VSS, on 2L [place] for comfort. 15 mg Oxycodone given x2 with adequate pain releif reported. Independent. Regular diet. Adequate urine output voiding in restroom, 2 BM reported. At time of note, patient awaiting bed on 6BT.

## 2017-04-04 NOTE — Unmapped (Signed)
Problem: Patient Care Overview  Goal: Plan of Care Review  Outcome: Progressing  Pt aox4, c/o incisional pain well controlled with the given pain meds, VSS. Pt receiving antibiotics per order, received enema and Golytely   04/03/17 1809   OTHER   Plan of Care Reviewed With patient;family;mother   Plan of Care Review   Progress improving    following the abdomen xray, fair tolerance. Pt ambulated in the hallway with good tolerance, still required 2-3L of oxygen with activities and when asleep. Plan to transfer to pulmonary service when bed is available. Will continue monitoring  Goal: Individualization and Mutuality   03/19/17 0114 03/21/17 1619 03/24/17 1524   OTHER   How to Address Anxieties/Fears --  --  Reassurance about current lung fxn, communication w./ other cf patient with similar experience.    Individualization   Patient Specific Preferences Call me [name] --  --    Patient Specific Goals (Include Timeframe) --  To engage in airway clearence despite discomfort and pain --        Problem: Fall Risk (Adult)  Goal: Identify Related Risk Factors and Signs and Symptoms  Related risk factors and signs and symptoms are identified upon initiation of Human Response Clinical Practice Guideline (CPG).   Outcome: Progressing   04/02/17 0634   Fall Risk (Adult)   Related Risk Factors (Fall Risk) fatigue/slow reaction;inadequate lighting;polypharmacy;objects hard to reach;environment unfamiliar       Problem: Infection, Risk/Actual (Adult)  Goal: Identify Related Risk Factors and Signs and Symptoms  Related risk factors and signs and symptoms are identified upon initiation of Human Response Clinical Practice Guideline (CPG).   Outcome: Progressing   04/02/17 0634   Infection, Risk/Actual (Adult)   Related Risk Factors (Infection, Risk/Actual) skin integrity impairment;chronic illness/condition;prolonged hospitalization   Signs and Symptoms (Infection, Risk/Actual) heart rate increase;pain;weakness

## 2017-04-04 NOTE — Unmapped (Signed)
Problem: Cystic Fibrosis (Adult)  Goal: Signs and Symptoms of Listed Potential Problems Will be Absent, Minimized or Managed (Cystic Fibrosis)  Signs and symptoms of listed potential problems will be absent, minimized or managed by discharge/transition of care (reference Cystic Fibrosis (Adult) CPG).   Outcome: Progressing  Patient was compliant with all inhaled scheduled medications and airway clearance tonight.

## 2017-04-04 NOTE — Unmapped (Signed)
INTERVENTIONAL PULMONOLOGY INITIAL CONSULT NOTE    Assessment:     Christian Young is a 31 year old male with history of cystic fibrosis with bronchiectasis, sinusitis, and pancreatic insufficiency, DVTs on chronic anticoagulation prior to admission (currently only on DVT prophylasis) who underwent right upper lobectomy on 03/29/17 after perfusion scan noted decreased perfusion to RUL and concern that this was contributing to recurrent infections and exacerbations with smooth Pseudomonas, stenotrophomonas, and M. Abscessus. He had fevers after surgery so adjustments were made to his antibiotics by the pulmonary consult team. He had a surgical chest tube placed after his RUL lobectomy which was removed on 04/02/17.    He had chest X-ray today to evaluate his fevers and was found to have an elevated right hemidiaphragm and small right pleural Young. Interventional pulmonary is consulted for evaluation of pleural Young.    Christian Young is small on bedside ultrasound and is not large enough to sample at this time. Differential for his small Young includes an inflammatory process post-operatively, volume overload (this is less likely in the absence of clinical evidence of volume overload), and pleural infection. Infection of his pleural space is unlikely as he has no leukocytosis and has been afebrile since the evening of 04/02/17. We see no indication for sampling of his small Young at this time.     Plan:     1. Small right pleural Young: too small to sample safely. Recommend monitoring clinically.    2. If patient has fevers, chest pain, or develops leukocytosis, recommend cross sectional chest imaging to further evaluate his pleural space.    Interventional pulmonary service will sign off at this time. Please do not hesitate to call if the patient's clinical status changes.     This patient was seen and evaluated with Dr. Erick Colace.    Please call the Interventional Pulmonary fellow at [phone] with any questions.    Irem Stoneham R. Dillon Livermore, DO  Pulmonary and Critical Care Medicine Fellow    History:     Requesting Physician and Service: Etta Quill, [facility]    Reason for Consult: Right pleural Young    HPI: Christian Young is a 31 year old male with history of cystic fibrosis with bronchiectasis, sinusitis, and pancreatic insufficiency, DVTs on chronic anticoagulation prior to admission (currently only on DVT prophylasis) who underwent right upper lobectomy on 03/29/17 after perfusion scan noted decreased perfusion to RUL and concern that this was contributing to recurrent infections and exacerbations with smooth Pseudomonas, stenotrophomonas, and M. Abscessus. He was noted to be febrile after surgery and a chest X-ray was performed to further evaluate which revealed a right pleural Young.     Christian Young complains of pain at the site of thoracotomy which is relatively well controlled with his current pain medication regimen. He denies anterior chest pain, dyspnea, cough. He has been afebrile since last night around 7 PM.     He was having fevers up to 38.6 with associated chills and malaise. He reports he no longer has chills and feels a little better than he did yesterday. He does cough up some sputum.    Past Medical History:   Diagnosis Date   ??? Anxiety    ??? Chronic pain disorder    ??? Cystic fibrosis (CMS-HCC)    ??? Depression    ??? Hypertension        Past Surgical History:   Procedure Laterality Date   ??? PR REMOVAL OF LUNG,LOBECTOMY Right 03/29/2017  Procedure: REMOVAL OF LUNG, OTHER THAN PNEUMONECTOMY; SINGLE LOBE (LOBECTOMY);  Surgeon: Cherie Dark, MD;  Location: MAIN OR [facility];  Service: Thoracic       Current Facility-Administered Medications   Medication Dose Route Frequency Provider Last Rate Last Dose   ??? acetaminophen (TYLENOL) tablet 1,000 mg  1,000 mg Oral TID Jerilee Hoh, MD   1,000 mg at 04/03/17 1507   ??? albuterol 2.5 mg /3 mL (0.083 %) nebulizer solution 2.5 mg  2.5 mg Nebulization 4x Daily (RT) Cherie Dark, MD   2.5 mg at 04/03/17 1805   ??? amikacin (AMIKIN) 1,000 mg in sodium chloride (NS) 0.9% 250 mL IVPB  1,000 mg Intravenous Q24H Nyra Jabs, MD 279 mL/hr at 04/03/17 0840 1,000 mg at 04/03/17 0840   ??? azithromycin (ZITHROMAX) tablet 500 mg  500 mg Oral Q24H [facility] Jeanine Luz, DO   500 mg at 04/03/17 0841   ??? budesonide-formoterol (SYMBICORT) 160-4.5 mcg/actuation inhaler 2 puff  2 puff Inhalation BID (RT) Cherie Dark, MD   2 puff at 04/03/17 [phone]   ??? carvedilol (COREG) tablet 3.125 mg  3.125 mg Oral BID Jeanine Luz, DO   3.125 mg at 04/03/17 0841   ??? cetirizine (ZyrTEC) tablet 10 mg  10 mg Oral Daily Ennis Forts, MD   10 mg at 04/03/17 0841   ??? cholecalciferol (vitamin D3) tablet 6,000 Units  6,000 Units Oral Daily Jeanine Luz, DO   6,000 Units at 04/03/17 0841   ??? clofazimine 50 MG capsule - PATIENT SUPPLIED  100 mg Oral daily Ennis Forts, MD   100 mg at 04/03/17 1449   ??? diphenhydrAMINE (BENADRYL) capsule/tablet 25 mg  25 mg Oral Q6H PRN Sherlynn Stalls, MD   25 mg at 04/03/17 1507   ??? diphenhydrAMINE (BENADRYL) injection 25 mg  25 mg Intravenous Q4H PRN Shelly Rubenstein, MD   25 mg at 04/02/17 1319   ??? docusate sodium (COLACE) capsule 100 mg  100 mg Oral BID Seymour Bars, MD   100 mg at 04/02/17 0917   ??? [START ON 04/04/2017] dornase alfa (PULMOZYME) 1 mg/mL nebulizer solution 2.5 mg  2.5 mg Inhalation Daily (RT) Cherie Dark, MD       ??? fluticasone (FLONASE) 50 mcg/actuation nasal spray 2 spray  2 spray Each Nare Daily Ennis Forts, MD   2 spray at 04/03/17 0841   ??? glycerin (adult) suppository 1 suppository  1 suppository Rectal Daily PRN Marguerite Olea, MD       ??? heparin (porcine) 7,500 units/0.75 mL syringe  7,500 Units Subcutaneous [facility] Sherlynn Stalls, MD   7,500 Units at 04/03/17 1507   ??? heparin, porcine (PF) 100 unit/mL injection 5 mL  5 mL Intravenous Q8H PRN Robert Bellow, MD   5 mL at 03/27/17 1015   ??? hydrALAZINE (APRESOLINE) injection 10 mg  10 mg Intravenous Q6H PRN Ennis Forts, MD       ??? HYDROmorphone (DILAUDID) 50mg /48ml (1mg /ml) PCA CADD   Intravenous Continuous Lauren Stan Head, AGNP       ??? lamoTRIgine (LaMICtal) tablet 25 mg  25 mg Oral BID Robert Bellow, MD   25 mg at 04/03/17 0840   ??? lisinopril (PRINIVIL,ZESTRIL) tablet 10 mg  10 mg Oral Daily Ennis Forts, MD   10 mg at 04/03/17 0841   ??? magnesium oxide (MAG-OX) tablet 800 mg  800 mg Oral BID Karin Golden  Cathlean Cower, MD   800 mg at 04/03/17 1506   ??? melatonin tablet 6 mg  6 mg Oral Nightly Ennis Forts, MD   6 mg at 04/02/17 2049   ??? minocycline (MINOCIN,DYNACIN) capsule 100 mg  100 mg Oral BID Nyra Jabs, MD   100 mg at 04/03/17 4782   ??? montelukast (SINGULAIR) tablet 10 mg  10 mg Oral Nightly Ennis Forts, MD   10 mg at 04/02/17 2049   ??? MVW Complete (pediatric multivit 61-D3-vit K) 1,500-800 unit-mcg 1 capsule  1 capsule Oral BID Robert Bellow, MD   1 capsule at 04/03/17 0841   ??? naloxone (NARCAN) injection 0.4 mg  0.4 mg Intravenous Q5 Min PRN Clemencia Course, MD       ??? nystatin (MYCOSTATIN) cream 1 application  1 application Topical BID Doren Custard, MD       ??? omeprazole (PriLOSEC) capsule 20 mg  20 mg Oral Daily Ennis Forts, MD   20 mg at 04/03/17 0841   ??? ondansetron (ZOFRAN-ODT) disintegrating tablet 4 mg  4 mg Oral Q8H PRN Seymour Bars, MD       ??? oxyCODONE (ROXICODONE) immediate release tablet 10 mg  10 mg Oral Q4H PRN Lauren Stan Head, AGNP        Or   ??? oxyCODONE (ROXICODONE) immediate release tablet 15 mg  15 mg Oral Q4H PRN Lauren Stan Head, AGNP       ??? pancrelipase (Lip-Prot-Amyl) (CREON) 24,000-76,000 -120,000 unit capsule 264,000 units of lipase  264,000 units of lipase Oral 3xd Meals Ennis Forts, MD   264,000 units of lipase at 04/03/17 1736   ??? phytonadione (vitamin K1) (MEPHYTON) tablet 5 mg  5 mg Oral Q M and Th Johnanna Schneiders, MD   5 mg at 04/01/17 9562   ??? piperacillin-tazobactam (ZOSYN) IVPB (premix) 4.5 g  4.5 g Intravenous Q6H Nyra Jabs, MD 200 mL/hr at 04/03/17 1507 4.5 g at 04/03/17 1507   ??? polyethylene glycol (MIRALAX) packet 17 g  17 g Oral TID Marguerite Olea, MD       ??? pramipexole (MIRAPEX) tablet 0.25 mg  0.25 mg Oral Daily Ennis Forts, MD   0.25 mg at 04/03/17 0845   ??? pregabalin (LYRICA) capsule 200 mg  200 mg Oral BID Ennis Forts, MD   200 mg at 04/03/17 0841   ??? sodium chloride (NS) 0.9% infusion  10 mL/hr Intravenous Continuous Sherlynn Stalls, MD 10 mL/hr at 04/03/17 1205 10 mL/hr at 04/03/17 1205   ??? sodium chloride 7% nebulizer solution 4 mL  4 mL Nebulization 4x Daily (RT) Cherie Dark, MD   4 mL at 04/03/17 1806   ??? tezacaftor 100mg /ivacaftor 150mg  and ivacaftor 150mg  (SYMDEKO) tablets - PATIENT SUPPLIED  1 tablet Oral BID Ennis Forts, MD   1 tablet at 04/03/17 [phone]   ??? tobramycin (PF) (TOBI) 300 mg/5 mL nebulizer solution 300 mg  300 mg Nebulization BID (RT) Nyra Jabs, MD   300 mg at 04/03/17 0817   ??? traZODone (DESYREL) tablet 150 mg  150 mg Oral Nightly Robert Bellow, MD   150 mg at 04/02/17 2049       Allergies as of 03/18/2017 - Reviewed 03/18/2017   Allergen Reaction Noted   ??? [place] [aztreonam lysine] Anaphylaxis 12/27/2016       Family History   Problem Relation Age of Onset   ??? Bipolar disorder Mother    ???  Depression Mother        Social History     Social History   ??? Marital status: Married     Spouse name: N/A   ??? Number of children: N/A   ??? Years of education: N/A     Occupational History   ??? Not on file.     Social History Main Topics   ??? Smoking status: Never Smoker   ??? Smokeless tobacco: Never Used   ??? Alcohol use No   ??? Drug use: No   ??? Sexual activity: Yes     Partners: Female     Birth control/ protection: Condom     Other Topics Concern   ??? Not on file     Social History Narrative   ??? No narrative on file       Review of Systems  A 12 point review of systems was negative except for pertinent items noted in the HPI.    Objective:     Physical Examination:   BP 133/95  - Pulse 105  - Temp 36.8 ??C (Oral)  - Resp 18  - Ht 182.9 cm (6')  - Wt (!) 128.6 kg (283 lb 8.2 oz)  - SpO2 95%  - BMI 38.45 kg/m??     General appearance - alert, well appearing, and in no distress    Eyes - Sclera anicteric, conjunctiva pink  Mouth - mucous membranes moist, pharynx normal without lesions  Neck - Trachea supple and midline, (-) JVD   Lymphatics - no palpable lymphadenopathy  Heart - normal rate, regular rhythm, normal S1, S2, no murmurs, rubs, clicks or gallops  Chest - Thoracotomy incision site tender to palpation but without erythema or drainage. Site of chest tube removal also without erythema or drainage. Lungs with crackles diffusely. No wheezing.  Abdomen - Soft  Extremities - No pedal edema, no clubbing or cyanosis  Skin - normal coloration and turgor, no rashes, no suspicious skin lesions noted  Neurological - alert, oriented, normal speech, no focal findings or movement disorder noted    Labs and Imaging:    Chest X-ray PA and lateral 04/03/17: Slightly increased size of right apicolateral pneumothorax. Stable to slightly increased small loculated right pleural Young. Trace left pleural Young. Possible elevation of right hemidiaphragm on my review of imaging.     Bedside ultrasound of chest: Small, hypoechoic, simple right pleural Young. Trace left pleural Young.

## 2017-04-04 NOTE — Unmapped (Signed)
Rodd Heft is a 31 y.o. male currently admitted with CF exacerbation for IV antibiotics. Pt had a lobectomy 03/29/17.     Pt rpts that he believes the procedure went well and he continues to hope that this will result in less admissions in the future. Pt rpts his anxiety has decreased but he's recognizing a new need to focus on thoughts that prevent him from strict adherence to CF regimen. Pt is working with a Futures trader and encouraged pt to bring this up and discuss cognitive distortions associated. Pt also interested in the CF online support group- the next one is focused on young adults- SW made colleague aware who will be facilitating next group.    Pt would like to pursue disability and has spoken with Naomie Dean, JD regarding necessary steps. SW assisted pt in completing consent form for medical records release. SW emailed to [facility] 04/04/17.       SW will continue to f/u with pt.      Serita Grit, LCSW

## 2017-04-04 NOTE — Unmapped (Signed)
Pulmonary Consult Service  Consult Note      Primary Service:   Thoracic Surgery  Primary Service Attending:  Cherie Dark, MD  Reason for Consult:   Cystic Fibrosis    ASSESSMENT and PLAN     Christian Young is a 31 y.o. male with h/o CF [phone] and [id number]) c/b bronchiectasis, chronic sinusitis and pancreatic insufficiency, as well as DVT (anticoagulated), HTN and depression that was admitted to [facility] with exacerbation of CF bronchiectasis, initially to the inpatient pulmonary service. He is now s/p RUL lobectomy on 03/29/2017. We are consulted to follow from a pulmonary standpoint while he is on the thoracic surgery service.    # Fevers post RUL lobectomy: Fevers starting on 8/19, most recent 38.6 at 18:56 8/21. Pleural effusion and persistent right hemidiaphragm elevation found on chest xray completed to evaluate his fevers.  ?? IP consult to assess pleural effusion -> too small to smaple  ?? Continue antibiotics as below  ?? Follow up blood cultures    # CF with Acute Exacerbation of Bronchiectasis. Presented with increased SOB/DOE, worsening cough/sputum production and small-volume hemoptysis consistent with exacerbation of bronchiectasis. Sputum culture from 03/18/17 with smooth Pseudumonas (S: tobra, Levo/Cipro; I: Aztreonam, Zosyn; R: amikacin, cefepime, ceftazidime, imipenem/meropenem). Of note he is also being treated for macrolide-sensitive M abscessus with azithro, clofazimine and inhaled amikacin, tentative end date of 11/2017 per his primary pulmonologist, Dr. Koren Shiver', note. Additionally, during this admission his sputum culture from 8/17 was positive for stenotrophomonas maltophilia (R: Ceftazidime, S: Levofloxacin, Minocycline, and Trimethoprim + Sulfamethoxazole). Of note, [name] has had ~8 admissions for exacerbations requiring IV antibiotics the past year. CT demonstrates significant scarring and volume loss in RUL, and perfusion scan demonstrates decreased perfusion to this lobe. Given concern his RUL was likely contributing to recurrent infections without contributing to ventilation/perfusion, he underwent RUL lobectomy 8/17.   ?? Continue Zosyn IV, pseudomonal dosing  ?? Continue Tobramycin  ?? Continue Minocycline  ?? Continue Amikacin IV, to protect against surgical site incision, then will likely transition back to long-term inhaled  ?? Continue home azithromycin, clofazimine given M abscessus. Holding inhaled Amikacin while on IV Amikacin.  ?? Continue Symdeko (CFTR Modulator)  ?? For airway clearance -- continue hypertonic saline nebs 7% qid, albuterol nebs qid, pulmozyme, and Aerobika or flutter valve. Okay to hold vest therapy given post-op with chest tube.    # CF-related GI manifestations. Because of CF, [name] is prone to constipation and potentially obstruction (ie DIOS). He continues to have runny bowel movements which were evaluated with an abdominal xray this afternoon. This demonstrated heavy stool burden, suggesting loose stools around obstructed area. Completing an enema should help alleviate this as heavy stool burden is in his rectum  ?? Enema, continue to monitor bowel movements  ?? Please minimize narcotics as you're able to.  ?? Adjust miralax and colace to ensure at least two bowel movements per day    # Acute on Chronic Pain.  ?? As discussed above, minimize narcotics as reasonably able to and ensure bowel regimen  ?? NB:    Would avoid NSAIDs (eg Toradol) with aminoglycoside usage which already predisposes him to nephrotoxicity - this is critically important.    # CF-related pancreatic insufficiency  ?? Continue Creon with meals/snacks and Vitamins (MVW Complete for CF)  ?? Appreciate dietician    # CF-related Chronic SInusitis  ?? Continue home Flonase, Zyrtec, Singulair,       Thank you for allowing [name]  to participate in this patient's care. Please do not hesitate to call the on-call pulmonary fellow at [phone] with any questions.    This patient was seen and discussed with Dr. Leticia Clas.     Staci Acosta, MD,MPH  [facility] Pulmonary and Critical Care Medicine Fellow  Personal pager: [phone] - 24-hr consult pager: [phone]      INTERVAL HISTORY:     Fever to 38.6 at 7pm last night, blood cultures drawn. Continues to have runny bowel movements. CXR demonstrating elevated right hemidiaphragm      PHYSICAL EXAM:   BP 133/95  - Pulse 105  - Temp 36.8 ??C (Oral)  - Resp 18  - Ht 182.9 cm (6')  - Wt (!) 128.6 kg (283 lb 8.2 oz)  - SpO2 95%  - BMI 38.45 kg/m??   General: Comfortably lying in bed, appears slightly drowsy but awakens easily to voice and is conversant  HEENT: EOMI, sclera anicteric  CV: Tachy rate, regular rhythm, no murmurs  Lungs: No increased work of breath. Anteriorly decreased right upper lung fields. Upper lung field crackles. No wheezes.  Chest: R-sided chest tube, small air leak present  Abd: Soft, NT, hypoactive bowel sounds  Ext: Warm, well perfused      LABORATORY and RADIOLOGY DATA:     Pertinent Laboratory Data:  Lab Results   Component Value Date    WBC 6.9 04/03/2017    HGB 9.6 (L) 04/03/2017    HCT 29.2 (L) 04/03/2017    PLT 371 04/03/2017       Lab Results   Component Value Date    NA 132 (L) 04/03/2017    K 4.4 04/03/2017    CL 91 (L) 04/03/2017    CO2 33.0 (H) 04/03/2017    BUN 16 04/03/2017    CREATININE 0.77 04/03/2017    GLU 126 04/03/2017    CALCIUM 8.4 (L) 04/03/2017    MG 1.8 04/03/2017    PHOS 5.0 (H) 04/03/2017       Lab Results   Component Value Date    BILITOT 0.3 03/21/2017    PROT 6.5 03/21/2017    ALBUMIN 3.6 03/21/2017    ALT 79 (H) 03/21/2017    AST 36 03/21/2017    ALKPHOS 65 03/21/2017       Lab Results   Component Value Date    INR 0.97 03/29/2017       Pertinent Micro Data:  Susceptibility  03/18/17 smooth PsA     Smooth Pseudomonas aeruginosa     KIRBY BAUER     Amikacin Resistant     Aztreonam Intermediate     Cefepime Resistant     Ceftazidime Resistant     Ciprofloxacin Susceptible     Imipenem Resistant     Levofloxacin Susceptible     Meropenem Resistant     Piperacillin + Tazobactam Intermediate     Tobramycin Susceptible                        Pertinent Imaging Data:  CXR 03/29/17 (personally reviewed)  -Postsurgical changes from right thoracotomy/upper lobectomy.    -Small right apical pneumothorax.

## 2017-04-04 NOTE — Unmapped (Signed)
Progress Note    Assessment/Plan:    Principal Problem:    Cystic fibrosis with pulmonary exacerbation (CMS-HCC)  Active Problems:    Essential hypertension    History of DVT (deep vein thrombosis)    Cystic fibrosis with intestinal manifestation (CMS-HCC)    Chronic pansinusitis    Pancreatic insufficiency due to cystic fibrosis (CMS-HCC)    Mycobacterium abscessus infection  Resolved Problems:    * No resolved hospital problems. *   Malnutrition Evaluation as performed by RD, LDN: Patient does not meet AND/ASPEN criteria for malnutrition at this time (03/19/17 1404)    Pressure Ulcer(s)    Active Pressure Ulcer     None                 Christian Young is a 31 y.o. male that presented to [facility] with Cystic fibrosis with pulmonary exacerbation (CMS-HCC).  ??  CF with Acute Exacerbation of Bronchiectasis. Presented with increased SOB/DOE, worsening cough/sputum production and small-volume hemoptysis consistent with exacerbation of bronchiectasis. Sputum culture from 03/18/17 with smooth Pseudumonas (S: tobra, Levo/Cipro; I: Aztreonam, Zosyn; R: amikacin, cefepime, ceftazidime, imipenem/meropenem). Of note he is also being treated for macrolide-sensitive M abscessus with azithro, clofazimine and inhaled amikacin, tentative end date of 11/2017 per his primary pulmonologist, Christian Young', note. Additionally, during this admission his sputum culture from 8/17 was positive for stenotrophomonas maltophilia (R: Ceftazidime, S: Levofloxacin, Minocycline, and Trimethoprim + Sulfamethoxazole). Of note, Christian Young has had ~8 admissions for exacerbations requiring IV antibiotics the past year. CT demonstrates significant scarring and volume loss in RUL, and perfusion scan demonstrates decreased perfusion to this lobe. Given concern his RUL was likely contributing to recurrent infections without contributing to ventilation/perfusion, he underwent RUL lobectomy 8/17.   ?? Continue Zosyn IV, pseudomonal dosing  ?? Continue inhaled Tobramycin  ?? Continue Minocycline  ?? Continue Amikacin IV, to protect against surgical site incision, then will likely transition back to long-term inhaled  ?? Continue home azithromycin, clofazimine given M abscessus. Holding inhaled Amikacin while on IV Amikacin.  ?? Continue Symdeko (CFTR Modulator)  ?? For airway clearance -- continue hypertonic saline nebs 7% qid, albuterol nebs qid, pulmozyme, and Aerobika or flutter valve. Okay to hold vest therapy given post-op with chest tube.  ??  CF-related GI manifestations. Improvement in constipation after SMOG enema and golytely  - Continue Creon with meals/snacks and Vitamins (MVW Complete for CF)  - Wean narcotics as able  ??  Acute on Chronic Pain.  As discussed above, minimize narcotics as reasonably able to and ensure bowel regimen  - Discontinued PCA  -Oxycodone 10-15 mg Q4 PRN for pain  -Acetominophen 1000mg  TID  -Pregabalin 200mg  PO 2X daily  - Will plan to wean narcotics as able    CF-related Chronic SInusitis  - Continue home Flonase, Zyrtec, Singulair,   ??  History of DVTs:??Per history may be both provoked and unprovoked, on chronic Xarelto 20mg  at home. Has not taken Xarelto since mid last week 2/2 inability to fill prescription. Held xarelto in setting of hemoptysis, changed to Heparin SQ on 8/6 at DVT ppx dosing along with not restarting xarelto in light of possible surgery.  - Per CT surgery ok to resume home Xarelto today, appreciate assistance  ??  HTN: Bps within goal  -Coreg 3.125mg  po BID   - PRN hydralazine  ??  Restless legs:  - Continue home mirapex 0.25 mg BID    Depression:??Doing well, denies SI at this time  - Continue  home lamictal and trazodone  ??  Rash, R Axilla erythematous with satellite lesions c/w yeast  - Nystatin    Code Status:  Full Code  ___________________________________________________________________    Subjective:   Complains of some abdominal pain after aggressive bowel regimen yesterday. Notes improvement of chest pain such that he has not required much PCA dilaudid. He also requests a heating pad, as he noticed that a warm blanket helped his chest incision and chest tube site pain last night. Complains of itching of right axilla minimally improved with topical benadryl.     Labs/Studies:  Labs and Studies from the last 24hrs per EMR and Reviewed    Objective:  Temp:  [36.8 ??C-37.1 ??C] 36.9 ??C  Heart Rate:  [88-110] 100  Resp:  [16-29] 20  BP: (128-134)/(58-75) 133/72  SpO2:  [95 %-97 %] 97 %    GEN: NAD, lying in bed  EYES: EOMI  ENT: MMM  CV: tachycardic, no MRG  PULM: Diminished R base, crackles throughout, normal WOB on RA  ABD: soft, NT/ND, +BS  EXT: No edema  SKIN: R chest tube dressing c/d/i, surgical incision c/d/i no obvious drainage, R axilla erythematous macular rash with satellite lesions

## 2017-04-05 LAB — PHOSPHORUS: Phosphate:MCnc:Pt:Ser/Plas:Qn:: 5.5 — ABNORMAL HIGH

## 2017-04-05 LAB — BASIC METABOLIC PANEL
ANION GAP: 10 mmol/L (ref 9–15)
BLOOD UREA NITROGEN: 15 mg/dL (ref 7–21)
BUN / CREAT RATIO: 20
CALCIUM: 8.3 mg/dL — ABNORMAL LOW (ref 8.5–10.2)
CHLORIDE: 95 mmol/L — ABNORMAL LOW (ref 98–107)
CO2: 29 mmol/L (ref 22.0–30.0)
CREATININE: 0.75 mg/dL (ref 0.70–1.30)
EGFR MDRD AF AMER: >=60 mL/min/{1.73_m2} (ref >=60)
GLUCOSE RANDOM: 121 mg/dL (ref 65–179)
POTASSIUM: 4.3 mmol/L (ref 3.5–5.0)

## 2017-04-05 LAB — RED BLOOD CELL COUNT: Lab: 3.65 — ABNORMAL LOW

## 2017-04-05 LAB — CBC
HEMATOCRIT: 29.2 % — ABNORMAL LOW (ref 41.0–53.0)
MEAN CORPUSCULAR HEMOGLOBIN CONC: 33.2 g/dL (ref 31.0–37.0)
MEAN CORPUSCULAR HEMOGLOBIN: 26.5 pg (ref 26.0–34.0)
MEAN CORPUSCULAR VOLUME: 79.8 fL — ABNORMAL LOW (ref 80.0–100.0)
MEAN PLATELET VOLUME: 7.8 fL (ref 7.0–10.0)
PLATELET COUNT: 489 10*9/L — ABNORMAL HIGH (ref 150–440)
RED BLOOD CELL COUNT: 3.65 10*12/L — ABNORMAL LOW (ref 4.50–5.90)
RED CELL DISTRIBUTION WIDTH: 16 % — ABNORMAL HIGH (ref 12.0–15.0)

## 2017-04-05 LAB — TOBRAMYCIN RANDOM: Tobramycin:MCnc:Pt:Ser/Plas:Qn:: <0.6

## 2017-04-05 LAB — CALCIUM: Calcium:MCnc:Pt:Ser/Plas:Qn:: 8.3 — ABNORMAL LOW

## 2017-04-05 LAB — MAGNESIUM: Magnesium:MCnc:Pt:Ser/Plas:Qn:: 1.9

## 2017-04-05 MED ORDER — AMIKACIN 500 MG/2 ML INJECTION SOLUTION
0 refills | 0 days | Status: CP
Start: 2017-04-05 — End: 2017-04-13

## 2017-04-05 MED ORDER — PIPERACILLIN-TAZOBACTAM 4.5 GRAM/100 ML DEXTROSE(ISO-OSM) IV PIGGYBACK
Freq: Four times a day (QID) | INTRAVENOUS | 3 refills | 0.00000 days | Status: CP
Start: 2017-04-05 — End: 2017-04-13

## 2017-04-05 MED ORDER — TOBRAMYCIN 300 MG/5 ML IN 0.225 % SODIUM CHLORIDE FOR NEBULIZATION
Freq: Two times a day (BID) | RESPIRATORY_TRACT | 0 refills | 0 days | Status: CP
Start: 2017-04-05 — End: 2017-04-06

## 2017-04-05 NOTE — Unmapped (Signed)
Problem: Breathing Pattern Ineffective (Adult)  Goal: Effective Oxygenation/Ventilation  Patient will demonstrate the desired outcomes by discharge/transition of care.   Outcome: Progressing  Patient did well with doing all of their treatments tonight.

## 2017-04-05 NOTE — Unmapped (Signed)
Home infusion:  [facility] at [phone], referral accepted for start of care for home infusion on 04/06/17.

## 2017-04-05 NOTE — Unmapped (Signed)
Aminoglycoside Follow-Up Pharmacy Note    Christian Young is a 31 y.o. male on amikacin 860 mg IV  every 24 hours for CF exacerbation and M. abscessus.  Currently on day 14 of therapy.    CF extended infusion (1 hour):  Goal peak: 35-45 mg/L   Goal trough: <1 mg/L for 6-8 hour drug free interval    Wt Readings from Last 3 Encounters:   04/03/17 (!) 128.6 kg (283 lb 8.2 oz)   02/21/17 (!) 115 kg (253 lb 9.6 oz)   02/15/17 (!) 118.8 kg (261 lb 14.5 oz)     Lab Results   Component Value Date    BUN 15 04/05/2017    BUN 16 04/04/2017    BUN 16 04/03/2017    BUN 16 04/02/2017    BUN 17 04/01/2017    CREATININE 0.75 04/05/2017    CREATININE 0.75 04/04/2017    CREATININE 0.77 04/03/2017    CREATININE 0.82 04/02/2017    CREATININE 0.79 04/01/2017       Pharmacokinetic Serum Concentrations:  Measured peak = 17.8 mcg/mL (@ 1106) ( ~1 hours post dose)  Calculated peak = 33 mcg/mL     Measured 10 hour random = 2.7 mcg/mL (@1700 ) (7 hours after end of dose)  Calculated trough = 0.02 mcg/mL     Patient-Specific Pharmacokinetic Parameters:  Vd = 25.7 L, ke = 0.31 hr-1 (Calculated 04/04/17)     PLAN:  Based on levels, recommend increase to 1200 mg IV every 24 hours given the severity of concern for gross spillage and contamination of the pleural space with M. Abscessus. The proposed new regimen would yield a Peak of 43 mcg/mL (more in the middle of the range) to optimize the clinical efficacy against microorganism.  Recheck levels in 2-3 days.    Pharmacy will continue to monitor for changes in renal function, toxicity, and efficacy and order additional levels as needed. Please page service pharmacist with questions/clarifications.    Gwendalyn Ege, PharmD, BCPS  Cardiothoracic ICU Clinical Pharmacist,  Vascular & Thoracic Surgery Services,  [facility] Lung Transplant Team  VM: [phone]  P: [phone]

## 2017-04-05 NOTE — Unmapped (Signed)
Progress Note    Assessment/Plan:    Principal Problem:    Cystic fibrosis with pulmonary exacerbation (CMS-HCC)  Active Problems:    Essential hypertension    History of DVT (deep vein thrombosis)    Cystic fibrosis with intestinal manifestation (CMS-HCC)    Chronic pansinusitis    Pancreatic insufficiency due to cystic fibrosis (CMS-HCC)    Mycobacterium abscessus infection  Resolved Problems:    * No resolved hospital problems. *   Malnutrition Evaluation as performed by RD, LDN: Patient does not meet AND/ASPEN criteria for malnutrition at this time (03/19/17 1404)    Pressure Ulcer(s)    Active Pressure Ulcer     None                 Christian Young is a 31 y.o. male that presented to [facility] with Cystic fibrosis with pulmonary exacerbation (CMS-HCC).  ??  CF with Acute Exacerbation of Bronchiectasis. Presented with increased SOB/DOE, worsening cough/sputum production and small-volume hemoptysis consistent with exacerbation of bronchiectasis. Sputum culture from 03/18/17 with smooth Pseudumonas (S: tobra, Levo/Cipro; I: Aztreonam, Zosyn; R: amikacin, cefepime, ceftazidime, imipenem/meropenem). Of note he is also being treated for macrolide-sensitive M abscessus with azithro, clofazimine and inhaled amikacin, tentative end date of 11/2017 per his primary pulmonologist, Dr. Koren Shiver', note. Additionally, during this admission his sputum culture from 8/17 was positive for stenotrophomonas maltophilia (R: Ceftazidime, S: Levofloxacin, Minocycline, and Trimethoprim + Sulfamethoxazole). Of note, Mr. Gravley has had ~8 admissions for exacerbations requiring IV antibiotics the past year. CT demonstrates significant scarring and volume loss in RUL, and perfusion scan demonstrates decreased perfusion to this lobe. Given concern his RUL was likely contributing to recurrent infections without contributing to ventilation/perfusion, he underwent RUL lobectomy 8/17.   - Continue Zosyn IV, pseudomonal dosing (8/21- ) x 10 days from last fever (8/22-8/31)  - Continue inhaled Tobramycin (8/21- )  - Continue Minocycline (8/21 -)   - Continue Amikacin IV, to protect against surgical site incision, then will likely transition back to long-term inhaled after 10 day course of above abx are completed   -  Continue home azithromycin, clofazimine given M abscessus. Holding inhaled Amikacin while on IV Amikacin.\  - Continue Symdeko (CFTR Modulator)  - For airway clearance -- continue hypertonic saline nebs 7% qid, albuterol nebs qid, pulmozyme, and Aerobika or flutter valve. Resume chest vest  - Plan to d/c 8/25 with home infusion, rx sent to [facility]  ??  CF-related GI manifestations. Improvement in constipation after SMOG enema and golytely  - Continue Creon with meals/snacks and Vitamins (MVW Complete for CF)  - Wean narcotics as able  ??  Acute on Chronic Pain.  -Oxycodone 10-15 mg Q4 PRN for pain  -Acetominophen 1000mg  TID  -Pregabalin 200mg  PO 2X daily  - Will plan to wean narcotics as able    CF-related Chronic SInusitis  - Continue home Flonase, Zyrtec, Singulair,   ??  History of DVTs:??Per history may be both provoked and unprovoked, on chronic Xarelto 20mg  at home. Has not taken Xarelto since mid last week 2/2 inability to fill prescription. Held xarelto in setting of hemoptysis, changed to Heparin SQ on 8/6 at DVT ppx dosing along with not restarting xarelto in light of possible surgery.  - Resumed xarelto  ??  HTN: Bps within goal  -Coreg 3.125mg  po BID   - PRN hydralazine  ??  Restless legs:  - Continue home mirapex 0.25 mg BID    Depression:??Doing well, denies SI at this  time  - Continue home lamictal and trazodone  ??  Rash, R Axilla erythematous with satellite lesions c/w yeast  - Nystatin    Code Status:  Full Code  ___________________________________________________________________    Subjective:   No issues overnight, feels well off oxygen. Would like to discharge home tomorrow.     Labs/Studies:  Labs and Studies from the last 24hrs per EMR and Reviewed    Objective:  Temp:  [36.9 ??C-37.5 ??C] 37.1 ??C  Heart Rate:  [89-106] 94  Resp:  [18-29] 18  BP: (123-163)/(66-75) 123/66  SpO2:  [96 %-98 %] 98 %    GEN: NAD, lying in bed  EYES: EOMI  ENT: MMM  CV: tachycardic, regular rate, no m/r/g  PULM: Diminished R base, coarse breath sound throughout, no wheezing.   ABD: soft, NT/ND, +BS  EXT: No LE edema  SKIN: right thoracic incision healing without surrounding erythema or edema, steri strips falling off.

## 2017-04-05 NOTE — Unmapped (Signed)
Problem: Breathing Pattern Ineffective (Adult)  Goal: Effective Oxygenation/Ventilation  Patient will demonstrate the desired outcomes by discharge/transition of care.   Patient received inhaled medications and airway clearance without complications. RT will continue to monitor.

## 2017-04-05 NOTE — Unmapped (Signed)
Problem: Patient Care Overview  Goal: Plan of Care Review   04/05/17 1213   OTHER   Plan of Care Reviewed With patient   Plan of Care Review   Progress improving   Pt is A&Ox4, pleasant, and well educated on POC.  Xarelto administered and pt encouraged to walk frequently for thrombus prevention.  Bed in low locked position, call bell within reach, mother at bedside and pt verbalizes understanding to call for assistance.  Antibiotics administered as ordered and aseptic technique followed.  Pt educated on importance of IS use and coughing and deep breathing.  Nebulizers and inhalers administered by RT as ordered.    Goal: Individualization and Mutuality  Outcome: Progressing    Goal: Discharge Needs Assessment  Outcome: Progressing    Goal: Interprofessional Rounds/Family Conf  Outcome: Progressing      Problem: Cystic Fibrosis (Adult)  Goal: Signs and Symptoms of Listed Potential Problems Will be Absent, Minimized or Managed (Cystic Fibrosis)  Signs and symptoms of listed potential problems will be absent, minimized or managed by discharge/transition of care (reference Cystic Fibrosis (Adult) CPG).   Outcome: Progressing      Problem: VTE, DVT and PE (Adult)  Goal: Signs and Symptoms of Listed Potential Problems Will be Absent, Minimized or Managed (VTE, DVT and PE)  Signs and symptoms of listed potential problems will be absent, minimized or managed by discharge/transition of care (reference VTE, DVT and PE (Adult) CPG).   Outcome: Progressing      Problem: Pain, Chronic (Adult)  Goal: Identify Related Risk Factors and Signs and Symptoms  Related risk factors and signs and symptoms are identified upon initiation of Human Response Clinical Practice Guideline (CPG).   Outcome: Progressing    Goal: Acceptable Pain/Comfort Level and Functional Ability  Patient will demonstrate the desired outcomes by discharge/transition of care.   Outcome: Progressing      Problem: Fall Risk (Adult)  Goal: Identify Related Risk Factors and Signs and Symptoms  Related risk factors and signs and symptoms are identified upon initiation of Human Response Clinical Practice Guideline (CPG).   Outcome: Progressing    Goal: Absence of Fall  Patient will demonstrate the desired outcomes by discharge/transition of care.   Outcome: Progressing      Problem: Infection, Risk/Actual (Adult)  Goal: Identify Related Risk Factors and Signs and Symptoms  Related risk factors and signs and symptoms are identified upon initiation of Human Response Clinical Practice Guideline (CPG).   Outcome: Progressing      Problem: Breathing Pattern Ineffective (Adult)  Goal: Identify Related Risk Factors and Signs and Symptoms  Related risk factors and signs and symptoms are identified upon initiation of Human Response Clinical Practice Guideline (CPG).   Outcome: Progressing    Goal: Effective Oxygenation/Ventilation  Patient will demonstrate the desired outcomes by discharge/transition of care.   Outcome: Progressing

## 2017-04-05 NOTE — Unmapped (Signed)
Problem: Patient Care Overview  Goal: Plan of Care Review  Outcome: Progressing  Pt is A/Ox4 on room air, VSS, SR-ST, had low grade fever early evening which resolved. Pain meds given as needed. Incision CDI. Using IS appropriately. Emotional support provided.    Problem: Infection, Risk/Actual (Adult)  Goal: Infection Prevention/Resolution  Patient will demonstrate the desired outcomes by discharge/transition of care.   Outcome: Progressing      Problem: Breathing Pattern Ineffective (Adult)  Goal: Anxiety/Fear Reduction  Patient will demonstrate the desired outcomes by discharge/transition of care.   Outcome: Progressing

## 2017-04-05 NOTE — Unmapped (Signed)
Pharmacy Note: Transition to Outpatient with Home Infusion  Christian Young 31 y/o Male  ??  Admission: 03/18/2017  ??  CF pulmonary exacerbation with cultures positive for Stenotrophomonas and Pseudomonas and a history of M. Abscessus   ??  ---------- ????Home Infusion Medication and Monitoring ---------    Disciplines requested: Nursing and Home IV Antibiotics    Physician to follow patient's care (the person listed here will be responsible for signing ongoing orders): Barnie Alderman    Start of Care Date: 04/06/17  Estimate completion of therapy: 04/14/17     IV ANTIBIOTIC ORDERS:  -Piperacillin-tazobactam (Zosyn) 4.5g IV every 6 hours  -Amikacin 1200 mg IV every 24 hours (CF extended infusion - Infuse over 60 minutes)-Please dose in the MORNING     **DO NOT DRAW DRUG LEVELS FROM PORT**    LAB MONITORING ORDERS:    Start labs on 04/08/17    Mon/Tue preferred for ONCE weekly labs  Mon and Thu preferred for TWICE weekly    -Ceftazidime/Cefepime/Zosyn/Aztreonam-ONCE WEEKLY: CBC with diff, BUN, SCr  -Amikacin (Daily Dosing): ONCE WEEKLY: amikacin random level (drawn 7 hrs after end of infusion time), CBC with diff; TWICE WEEKLY: BUN, SCr, Magnesium    TARGET DRUG LEVELS:   -Amikacin (CF extended infusion): Target end of infusion peak 35-45 mcg/mL, 12-hr random < 8 mcg/mL, trough < 3 mcg/mL    **FAX RESULTS TO: **  Pulmonary Clinic IV Antibiotic Triage: [phone]    *PHONE CONTACT*:  CF Patient: [phone]     S. Louie Bun, PharmD  PGY-1 Pharmacy Resident

## 2017-04-05 NOTE — Unmapped (Signed)
Thoracic Surgery Consult Note    Requesting Attending Physician :  Truett Mainland,*    Service Requesting Consult : Pulmonary Medicine    Consulting Physician: Dr. Rosebud Poles        Reason for Consult:  Bronchiectasis Exacerbation with consideration of right upper lobectomy      Assessment:   [name]??is a 31 y.o.??y/o male??with PMHx of late diagnosis CF [id number] mutation), pancreatic insufficiency, CF sinus disease, HTN, depression, and DVTs on chronic anticoagulation who??presents to [facility] with Cystic fibrosis with pulmonary exacerbation (CMS-HCC).       Recommendation/Plan:    We are signing off at this time though we are available for another consult if needed.    This was discussed with the consulting attending physician, Dr. Rosebud Poles, who agrees with the plan of care.    Please page the thoracic surgery consult pager [phone]) with any questions or change in patient status. We appreciate the consult.         History of Present Illness:     Christian Young??is a 31 y.o.??y/o male??with PMHx of late diagnosis CF [phone] mutation), pancreatic insufficiency, CF sinus disease, HTN, depression, and DVTs on chronic anticoagulation who??presents to [facility] with Cystic fibrosis with pulmonary exacerbation (CMS-HCC).    Allergies:  Allergies   Allergen Reactions   ??? [place] [Aztreonam Lysine] Anaphylaxis   ??? Cefepime Itching and Nausea Only   ??? Tobramycin Tinnitus     From OSH record-documented as tinnitus but has received IV tobra with close monitoring.       Medications:   Current Facility-Administered Medications   Medication Dose Route Frequency Provider Last Rate Last Dose   ??? acetaminophen (TYLENOL) tablet 1,000 mg  1,000 mg Oral TID Jerilee Hoh, MD   1,000 mg at 04/05/17 1325   ??? albuterol 2.5 mg /3 mL (0.083 %) nebulizer solution 2.5 mg  2.5 mg Nebulization 4x Daily (RT) Cherie Dark, MD   2.5 mg at 04/05/17 1259   ??? amikacin (AMIKIN) 1,200 mg in sodium chloride (NS) 0.9% 250 mL IVPB  1,200 mg Intravenous Q24H Lauren Rebecca Hill, AGNP 279.8 mL/hr at 04/05/17 0832 1,200 mg at 04/05/17 8413   ??? azithromycin (ZITHROMAX) tablet 500 mg  500 mg Oral Q24H SCH Jeanine Luz, DO   500 mg at 04/05/17 2440   ??? budesonide-formoterol (SYMBICORT) 160-4.5 mcg/actuation inhaler 2 puff  2 puff Inhalation BID (RT) Cherie Dark, MD   2 puff at 04/05/17 0859   ??? carvedilol (COREG) tablet 3.125 mg  3.125 mg Oral BID Jeanine Luz, DO   3.125 mg at 04/05/17 1027   ??? cetirizine (ZyrTEC) tablet 10 mg  10 mg Oral Daily Ennis Forts, MD   10 mg at 04/05/17 2536   ??? cholecalciferol (vitamin D3) tablet 6,000 Units  6,000 Units Oral Daily Jeanine Luz, DO   6,000 Units at 04/05/17 0809   ??? clofazimine 50 MG capsule - PATIENT SUPPLIED  100 mg Oral daily Maurine Minister V, MD   100 mg at 04/05/17 1132   ??? diphenhydrAMINE (BENADRYL) capsule/tablet 25 mg  25 mg Oral Q6H PRN Sherlynn Stalls, MD   25 mg at 04/05/17 1325   ??? diphenhydrAMINE (BENADRYL) injection 25 mg  25 mg Intravenous Q4H PRN Shelly Rubenstein, MD   25 mg at 04/02/17 1319   ??? docusate sodium (COLACE) capsule 100 mg  100 mg Oral BID Seymour Bars, MD   100  mg at 04/05/17 0811   ??? dornase alfa (PULMOZYME) 1 mg/mL nebulizer solution 2.5 mg  2.5 mg Inhalation Daily (RT) Cherie Dark, MD   2.5 mg at 04/05/17 1307   ??? fluticasone (FLONASE) 50 mcg/actuation nasal spray 2 spray  2 spray Each Nare Daily Ennis Forts, MD   2 spray at 04/05/17 [phone]   ??? glycerin (adult) suppository 1 suppository  1 suppository Rectal Daily PRN Marguerite Olea, MD       ??? heparin, porcine (PF) 100 unit/mL injection 5 mL  5 mL Intravenous Q8H PRN Robert Bellow, MD   5 mL at 03/27/17 1015   ??? lamoTRIgine (LaMICtal) tablet 25 mg  25 mg Oral BID Robert Bellow, MD   25 mg at 04/05/17 0813   ??? lisinopril (PRINIVIL,ZESTRIL) tablet 10 mg  10 mg Oral Daily Ennis Forts, MD   10 mg at 04/05/17 0810   ??? magnesium oxide (MAG-OX) tablet 800 mg  800 mg Oral BID Sherlynn Stalls, MD   800 mg at 04/05/17 1325   ??? melatonin tablet 6 mg  6 mg Oral Nightly Ennis Forts, MD   6 mg at 04/04/17 2223   ??? minocycline (MINOCIN,DYNACIN) capsule 100 mg  100 mg Oral BID Nyra Jabs, MD   100 mg at 04/05/17 9604   ??? montelukast (SINGULAIR) tablet 10 mg  10 mg Oral Nightly Ennis Forts, MD   10 mg at 04/04/17 2045   ??? MVW Complete (pediatric multivit 61-D3-vit K) 1,500-800 unit-mcg 1 capsule  1 capsule Oral BID Robert Bellow, MD   1 capsule at 04/05/17 5409   ??? nystatin (MYCOSTATIN) cream 1 application  1 application Topical BID Doren Custard, MD   1 application at 04/05/17 0836   ??? omeprazole (PriLOSEC) capsule 20 mg  20 mg Oral Daily Ennis Forts, MD   20 mg at 04/05/17 0810   ??? ondansetron (ZOFRAN-ODT) disintegrating tablet 4 mg  4 mg Oral Q8H PRN Seymour Bars, MD       ??? oxyCODONE (ROXICODONE) immediate release tablet 10 mg  10 mg Oral Q4H PRN Lauren Stan Head, AGNP        Or   ??? oxyCODONE (ROXICODONE) immediate release tablet 15 mg  15 mg Oral Q4H PRN Lauren Stan Head, AGNP   15 mg at 04/05/17 1238   ??? pancrelipase (Lip-Prot-Amyl) (CREON) 24,000-76,000 -120,000 unit capsule 264,000 units of lipase  264,000 units of lipase Oral 3xd Meals Ennis Forts, MD   264,000 units of lipase at 04/05/17 1131   ??? phytonadione (vitamin K1) (MEPHYTON) tablet 5 mg  5 mg Oral Q M and Th Johnanna Schneiders, MD   5 mg at 04/04/17 1001   ??? piperacillin-tazobactam (ZOSYN) IVPB (premix) 4.5 g  4.5 g Intravenous Q6H Nyra Jabs, MD 200 mL/hr at 04/05/17 1026 4.5 g at 04/05/17 1026   ??? polyethylene glycol (MIRALAX) packet 17 g  17 g Oral Daily Marguerite Olea, MD   17 g at 04/05/17 0809   ??? pramipexole (MIRAPEX) tablet 0.25 mg  0.25 mg Oral Daily Ennis Forts, MD   0.25 mg at 04/05/17 0813   ??? pregabalin (LYRICA) capsule 200 mg  200 mg Oral BID Ennis Forts, MD   200 mg at 04/05/17 8119   ??? rivaroxaban (XARELTO) tablet 20 mg  20 mg Oral Daily Doren Custard, MD   20 mg at 04/04/17 1857   ???  sodium chloride (NS) 0.9% infusion  10 mL/hr Intravenous Continuous Sherlynn Stalls, MD 10 mL/hr at 04/03/17 1205 10 mL/hr at 04/03/17 1205   ??? sodium chloride 7% nebulizer solution 4 mL  4 mL Nebulization 4x Daily (RT) Cherie Dark, MD   4 mL at 04/05/17 1259   ??? tezacaftor 100mg /ivacaftor 150mg  and ivacaftor 150mg  (SYMDEKO) tablets - PATIENT SUPPLIED  1 tablet Oral BID Ennis Forts, MD   1 tablet at 04/05/17 [phone]   ??? tobramycin (PF) (TOBI) 300 mg/5 mL nebulizer solution 300 mg  300 mg Nebulization BID (RT) Nyra Jabs, MD   300 mg at 04/05/17 0859   ??? traZODone (DESYREL) tablet 150 mg  150 mg Oral Nightly Robert Bellow, MD   150 mg at 04/04/17 2223       The MAR has been reviewed and the patient has not received plavix or another irreversible anticoagulant in the last seven days.     Medical History:   Past Medical History:   Diagnosis Date   ??? Anxiety    ??? Chronic pain disorder    ??? Cystic fibrosis (CMS-HCC)    ??? Depression    ??? Hypertension        Surgical History:  Past Surgical History:   Procedure Laterality Date   ??? PR REMOVAL OF LUNG,LOBECTOMY Right 03/29/2017    Procedure: REMOVAL OF LUNG, OTHER THAN PNEUMONECTOMY; SINGLE LOBE (LOBECTOMY);  Surgeon: Cherie Dark, MD;  Location: MAIN OR [facility];  Service: Thoracic       Social History:  History   Smoking Status   ??? Never Smoker   Smokeless Tobacco   ??? Never Used     History   Alcohol Use No     History   Drug Use No         Family History:  Family History   Problem Relation Age of Onset   ??? Bipolar disorder Mother    ??? Depression Mother        Review of Systems:  A 12 system review of systems was negative except as noted in HPI.     Physical Exam:  Vitals:    04/04/17 2310 04/04/17 2312 04/05/17 0810 04/05/17 1150   BP:  123/66 143/74 139/83   Pulse:  94 103 94   Resp:  18 16 16    Temp: 37.1 ??C  36.9 ??C 36.8 ??C   TempSrc: Oral  Axillary Oral   SpO2:    98%   Weight:       Height: Vitals:    04/02/17 0541 04/03/17 0010   Weight: (!) 127.1 kg (280 lb 3.3 oz) (!) 128.6 kg (283 lb 8.2 oz)       Physical Exam     Diagnostic Studies:    Data Review:    Lab Results   Component Value Date    WBC 7.7 04/05/2017    HGB 9.7 (L) 04/05/2017    HCT 29.2 (L) 04/05/2017    PLT 489 (H) 04/05/2017       Lab Results   Component Value Date    NA 134 (L) 04/05/2017    K 4.3 04/05/2017    CL 95 (L) 04/05/2017    CO2 29.0 04/05/2017    BUN 15 04/05/2017    CREATININE 0.75 04/05/2017    GLU 121 04/05/2017    CALCIUM 8.3 (L) 04/05/2017    MG 1.9 04/05/2017    PHOS 5.5 (H) 04/05/2017  Lab Results   Component Value Date    BILITOT 0.3 03/21/2017    PROT 6.5 03/21/2017    ALBUMIN 3.6 03/21/2017    ALT 79 (H) 03/21/2017    AST 36 03/21/2017    ALKPHOS 65 03/21/2017       Lab Results   Component Value Date    INR 0.97 03/29/2017         Chest CT: Images and report reviewed  CXR: Images and report reviewed    Xr Chest 2 Views    Result Date: 03/18/2017  EXAM: CHEST TWO VIEW DATE: 03/18/2017 5:43 PM ACCESSION: [id number] UN DICTATED: 03/18/2017 6:17 PM INTERPRETATION LOCATION: [facility] CLINICAL INDICATION: 31 years old Male with DYSPNEA--  COMPARISON: Chest radiograph dated 02/05/2017 TECHNIQUE: PA and lateral views of the chest. FINDINGS: Right chest wall port with tip terminating in the right atrium. Unchanged appearance of right upper lobe bronchiectasis. Lungs well-inflated. No new focal consolidation identified. No pleural effusion. No pneumothorax. Cardiomediastinal silhouette is unremarkable.      Sequelae of cystic fibrosis, similar to prior.    Shelly Rubenstein, MD  Department of Surgery, PGY1  P: [phone]  SRT P: [id number]

## 2017-04-06 LAB — BASIC METABOLIC PANEL
BLOOD UREA NITROGEN: 16 mg/dL (ref 7–21)
BUN / CREAT RATIO: 20
CALCIUM: 8.7 mg/dL (ref 8.5–10.2)
CHLORIDE: 97 mmol/L — ABNORMAL LOW (ref 98–107)
CO2: 26 mmol/L (ref 22.0–30.0)
CREATININE: 0.81 mg/dL (ref 0.70–1.30)
EGFR MDRD AF AMER: >=60 mL/min/{1.73_m2} (ref >=60)
EGFR MDRD NON AF AMER: >=60 mL/min/{1.73_m2} (ref >=60)
POTASSIUM: 4.8 mmol/L (ref 3.5–5.0)
SODIUM: 134 mmol/L — ABNORMAL LOW (ref 135–145)

## 2017-04-06 LAB — BLOOD UREA NITROGEN: Urea nitrogen:MCnc:Pt:Ser/Plas:Qn:: 16

## 2017-04-06 LAB — CBC
HEMATOCRIT: 30 % — ABNORMAL LOW (ref 41.0–53.0)
HEMOGLOBIN: 10 g/dL — ABNORMAL LOW (ref 13.5–17.5)
MEAN CORPUSCULAR HEMOGLOBIN CONC: 33.3 g/dL (ref 31.0–37.0)
MEAN CORPUSCULAR VOLUME: 80.7 fL (ref 80.0–100.0)
PLATELET COUNT: 483 10*9/L — ABNORMAL HIGH (ref 150–440)
RED BLOOD CELL COUNT: 3.72 10*12/L — ABNORMAL LOW (ref 4.50–5.90)
RED CELL DISTRIBUTION WIDTH: 15.6 % — ABNORMAL HIGH (ref 12.0–15.0)
WBC ADJUSTED: 8.9 10*9/L (ref 4.5–11.0)

## 2017-04-06 LAB — PHOSPHORUS
PHOSPHORUS: 5.2 mg/dL — ABNORMAL HIGH (ref 2.9–4.7)
Phosphate:MCnc:Pt:Ser/Plas:Qn:: 5.2 — ABNORMAL HIGH

## 2017-04-06 LAB — MAGNESIUM: Magnesium:MCnc:Pt:Ser/Plas:Qn:: 2

## 2017-04-06 LAB — HEMATOCRIT: Lab: 30 — ABNORMAL LOW

## 2017-04-06 MED ORDER — TOBRAMYCIN 300 MG/5 ML IN 0.225 % SODIUM CHLORIDE FOR NEBULIZATION: 300 mg | mL | Freq: Two times a day (BID) | 0 refills | 0 days | Status: AC

## 2017-04-06 MED ORDER — CARVEDILOL 3.125 MG TABLET: tablet | 0 refills | 0 days

## 2017-04-06 MED ORDER — MINOCYCLINE 100 MG CAPSULE: 100 mg | capsule | Freq: Two times a day (BID) | 0 refills | 0 days | Status: AC

## 2017-04-06 MED ORDER — CARVEDILOL 3.125 MG TABLET
ORAL_TABLET | Freq: Two times a day (BID) | ORAL | 0 refills | 0.00000 days | Status: CP
Start: 2017-04-06 — End: 2017-04-06

## 2017-04-06 MED ORDER — OXYCODONE 10 MG TABLET
ORAL_TABLET | Freq: Four times a day (QID) | ORAL | 0 refills | 0.00000 days | Status: CP | PRN
Start: 2017-04-06 — End: 2017-04-12

## 2017-04-06 MED ORDER — TOBRAMYCIN 300 MG/5 ML IN 0.225 % SODIUM CHLORIDE FOR NEBULIZATION
Freq: Two times a day (BID) | RESPIRATORY_TRACT | 0 refills | 0.00000 days | Status: CP
Start: 2017-04-06 — End: 2017-04-06

## 2017-04-06 MED ORDER — MINOCYCLINE 100 MG CAPSULE
ORAL_CAPSULE | Freq: Two times a day (BID) | ORAL | 0 refills | 0.00000 days | Status: CP
Start: 2017-04-06 — End: 2018-04-05

## 2017-04-06 MED ORDER — MAGNESIUM OXIDE 400 MG (241.3 MG MAGNESIUM) TABLET
ORAL_TABLET | Freq: Two times a day (BID) | ORAL | 11 refills | 0.00000 days | Status: SS
Start: 2017-04-06 — End: 2017-08-19

## 2017-04-06 MED ORDER — MAGNESIUM OXIDE 400 MG (241.3 MG MAGNESIUM) TABLET: 800 mg | tablet | Freq: Two times a day (BID) | 11 refills | 0 days | Status: SS

## 2017-04-06 MED ORDER — CHOLECALCIFEROL (VITAMIN D3) 25 MCG (1,000 UNIT) TABLET
ORAL_TABLET | Freq: Every day | ORAL | 11 refills | 0.00000 days | Status: CP
Start: 2017-04-06 — End: 2017-04-06

## 2017-04-06 MED ORDER — CHOLECALCIFEROL (VITAMIN D3) 25 MCG (1,000 UNIT) TABLET: tablet | 10 refills | 0 days

## 2017-04-06 MED FILL — VITAMIN D-3/1000 U/: VITAMIN D-3/1000 U/ | 100 days supply | Qty: 100 | Fill #0

## 2017-04-06 MED FILL — CARVEDILOL/3.125MG/TABS: CARVEDILOL/3.125MG/TABS | 30 days supply | Qty: 60 | Fill #0

## 2017-04-06 MED FILL — MINOCYCLINE HCL/100MG/CAPS: MINOCYCLINE HCL/100MG/CAPS | 7 days supply | Qty: 14 | Fill #0

## 2017-04-06 MED FILL — MAGNESIUM OXIDE/400MG/TABS: MAGNESIUM OXIDE/400MG/TABS | 30 days supply | Qty: 120 | Fill #0

## 2017-04-06 NOTE — Unmapped (Signed)
Problem: Cystic Fibrosis (Adult)  Goal: Signs and Symptoms of Listed Potential Problems Will be Absent, Minimized or Managed (Cystic Fibrosis)  Signs and symptoms of listed potential problems will be absent, minimized or managed by discharge/transition of care (reference Cystic Fibrosis (Adult) CPG).   Outcome: Progressing  Patient compliant with all of his breathing treatments, but was unable to to airway clearance (AEROBIKA) this shift do to surgery, will continue to monitor

## 2017-04-06 NOTE — Unmapped (Signed)
Problem: Patient Care Overview  Goal: Plan of Care Review  Outcome: Progressing  Patient tolerating tx well with nae.  Had complaint of pain relieved by prn pain medications.  No sob or dyspnea.  Vs remain wnl will continue to monitor.    Goal: Individualization and Mutuality  Outcome: Progressing   03/19/17 0114 03/21/17 1619   Individualization   Patient Specific Preferences Call me [name] --    Patient Specific Goals (Include Timeframe) --  To engage in airway clearence despite discomfort and pain   Patient Specific Interventions --  Offer pain medication and encourage airway clearence and walkiing.     Goal: Discharge Needs Assessment  Outcome: Progressing   03/19/17 1254 03/25/17 0434 03/30/17 0645   OTHER   Equipment Currently Used at Home --  --  --    Patient and/or family were provided with choice of facilities / services that are available and appropriate to meet post hospital care needs? --  Yes --    Discharge Needs Assessment   Concerns to be Addressed --  --  --    Readmission Within the Last 30 Days --  --  --    Patient/Family Anticipates Transition to --  --  home with family   Patient/Family Anticipated Services at Transition --  --  --    Transportation Anticipated --  --  family or friend will provide   Anticipated Changes Related to Illness none --  --    Equipment Needed After Discharge --  --  --    Discharge Facility/Level of Care Needs (home to self care) --  --    Current Discharge Risk --  --  --     04/02/17 0634   OTHER   Equipment Currently Used at Home respiratory supplies   Patient and/or family were provided with choice of facilities / services that are available and appropriate to meet post hospital care needs? --    Discharge Needs Assessment   Concerns to be Addressed adjustment to diagnosis/illness;home safety;compliance issue   Readmission Within the Last 30 Days planned readmission   Patient/Family Anticipates Transition to --    Patient/Family Anticipated Services at Transition home health care   Transportation Anticipated --    Anticipated Changes Related to Illness --    Equipment Needed After Discharge respiratory supplies   Discharge Facility/Level of Care Needs --    Current Discharge Risk chronically ill     Goal: Interprofessional Rounds/Family Conf  Outcome: Progressing   04/03/17 0929 04/05/17 0931   OTHER   Clinical EDD (Estimated Discharge Date)  --  04/08/17   Interdisciplinary Rounds/Family Conf   Participants case manager;nursing;social work/services --        Problem: Cystic Fibrosis (Adult)  Intervention: Monitor Stool Output/Characteristics   03/21/17 1619 04/03/17 0326   Interventions   Bowel Dysfunction Management hygiene measures promoted;relaxation techniques promoted;sitting position facilitated --    Medication Review/Management --  medications reviewed     Intervention: Maximize Oxygenation/Ventilation/Perfusion   04/03/17 0838 04/04/17 2000 04/05/17 2100   Interventions   Head of Bed (HOB) --  --  HOB elevated   Airway/Ventilation Management airway patency maintained --  --    Administration (IS) --  self-administered --      Intervention: Promote Aggressive Pulmonary Hygiene and Secretion Management   04/03/17 0838 04/04/17 0845   Interventions   Cough And Deep Breathing --  done independently per patient   Breathing Techniques/Airway Clearance deep/controlled cough encouraged --  Intervention: Manage Suspected/Actual Infection   03/25/17 0434 04/04/17 2000 04/06/17 0000   Interventions   Fever Reduction/Comfort Measures lightweight bedding;lightweight clothing --  --    Isolation Precautions --  --  contact precautions maintained   Infection Management --  aseptic technique maintained --      Intervention: Optimize Nutritional Intake   03/22/17 0800   Interventions   Oral Nutrition Promotion calorie dense foods provided;calorie dense liquids provided       Goal: Signs and Symptoms of Listed Potential Problems Will be Absent, Minimized or Managed (Cystic Fibrosis)  Signs and symptoms of listed potential problems will be absent, minimized or managed by discharge/transition of care (reference Cystic Fibrosis (Adult) CPG).   Outcome: Progressing   04/02/17 0634   Cystic Fibrosis (Adult)   Problems Assessed (Cystic Fibrosis) pulmonary/respiratory complications;situational response;infection;malabsorption/maldigestion   Problems Present (Cystic Fibrosis) situational response;pulmonary/respiratory complications;infection

## 2017-04-06 NOTE — Unmapped (Signed)
Consult received and services are being managed by the care management team. See note 03/19/17.  04/06/2017 8:25 AM

## 2017-04-07 NOTE — Unmapped (Signed)
Physician Discharge Summary    Identifying Information:   Christian Young  11-Oct-1985  [id number]    Admit date: 03/18/2017    Discharge date: 04/06/2017     Discharge Service: Pulmonology (MDG)    Discharge Attending Physician: Viona Gilmore, MD, MPH    Discharge to: Home with home infusion    Discharge Diagnoses:  Principal Problem:    Cystic fibrosis with pulmonary exacerbation (CMS-HCC)  Active Problems:    Essential hypertension    History of DVT (deep vein thrombosis)    Cystic fibrosis with intestinal manifestation (CMS-HCC)    Chronic pansinusitis    Pancreatic insufficiency due to cystic fibrosis (CMS-HCC)    Mycobacterium abscessus infection  Resolved Problems:    * No resolved hospital problems. *      Outpatient Provider Follow Up Issues:   1. Please attend pulmonary rehab  2. Please see audiology for ototoxicity evaluation in light of amikacin and tobramycin use  3. Please follow-up with Thoracic Surgery for staple removal 2 weeks after procedure.  4. Antibiotic monitoring labs as below: 8/25-8/31  -Ceftazidime/Cefepime/Zosyn/Aztreonam-ONCE WEEKLY: CBC with diff, BUN, SCr  -Amikacin (Daily Dosing): ONCE WEEKLY: amikacin random level (drawn 7 hrs after end of infusion time), CBC with diff; TWICE WEEKLY: BUN, SCr, Magnesium    Hospital Course:   Christian Young??is a 31 y.o.??y/o male??with PMHx of CF [phone] mutation with pancreatic insufficiency, CF sinus disease, HTN, depression, history of DVTs on chronic anticoagulation who??presents to [facility] with Cystic fibrosis with pulmonary exacerbation. Hospital Course is outlined below by problem list:    CF with Bronchiectasis Exacerbation:??Diagnosed at age 31. Most recent PFTs 02/21/17 showed FEV1 74% which was at baseline if not better than previous. Sputum culture from 03/18/17??with smooth Pseudumonas (S: tobra, Levo/Cipro; I: Aztreonam, Zosyn; R: amikacin, cefepime, ceftazidime, imipenem/meropenem). Of note he is also being treated for macrolide-sensitive M abscessus with azithro, clofazimine and inhaled amikacin, tentative end date of 11/2017 per his primary pulmonologist, Christian Young', note. He was started on ceftazidime, and amikacin on 8/7-8/20. Subsequent sputum culture from 8/17 was positive for stenotrophomonas maltophilia (R: Ceftazidime, S: Levofloxacin, Minocycline, and Trimethoprim + Sulfamethoxazole). He became febrile on 8/19 after his surgical procedure as below.  IV zosyn and inhaled tobramycin were added for further pseudomonal coverage and ceftazidime was discontinued. Minocycline was added for stenotrophomonas. Plan to continue IV Zosyn, IV amikacin, oral minocycline, and inhaled Tobramycin until 8/31. Then transition to inhaled amikacin. He is interested in pulmonary rehab program after discharge. He also needs audiometry testing.    Right Upper Lobectomy: As Christian Young has had ~8 admissions for exacerbations requiring IV antibiotics the past year. His CT demonstrated significant scarring and volume loss in RUL, and perfusion scan demonstrates decreased perfusion to this lobe. Given concern his RUL was likely contributing to recurrent infections without contributing to ventilation/perfusion, he underwent RUL lobectomy 8/17. He subsequently had a small Right apical pneumothorax for which a chest tube was placed. On 8/19 he became febrile. Given 8/17 culture results he was started on zosyn and inhaled tobramycin, along  With continuing M abscessus meds and adding minocycline for stenotrophomonas.  The chest tube was removed on 8/21 after resolution of pneumothorax. He required an epidural and PCA for post op pain control. He was noted to have an elevated right hemi-diaphragm on XR with concern for pleural effusion on 08/22. U/S by Interventional Pulmonology revealed a pleural effusion too small to drain and a reduced mobility of the right hemidiaphragm.  He was successfully transitioned to PO pain medicines on 8/23. He will require staple removal from his incision 2 weeks post op.      History of DVTs:??Per history may be both provoked and unprovoked, on chronic Xarelto 20mg  at home. Had not taken Xarelto since approximately 1 week prior to admission 2/2 inability to fill prescription. Held xarelto on admission in setting of hemoptysis. Gave Heparin SQ at DVT ppx dosing to provide some anticoagulation. Per surgery his home Xarelto was resumed 8/23.   ??  Acute on Chronic Sinusitis: Prescribed Augmentin on 8/2 by PCP. CT sinus in May 2018 showed sequelae of previous sinus surgery and bilateral mucosal thickening. We continued home flonase, zyrtec, singulair.   ??  Pancreatic insufficiency 2/2 CF:??Wt 253lb (baseline 255-265) on admission. We continued home Creon 7 caps with meals, 5 with snacks (home Creaon which has 36,000 lipase per pill, re-ordered this for meals if he has, but could not re-order with snacks so ordered the 24,000 lipase pill for snacks, will need to convert meal time to appropriate 11 caps with meals). His Pancreatic elastase was <16mcg/g  On discharge he will  resume home Creon 36,000 after discharge. When discharged home resume usual Complete Formulation Vit D5000 softgels, 2 daily. Provides total of 10,000 International units vitamin D daily  ??  HTN:??Patient was hypertensive at 140/99 on admission. We continued his home Lisinopril 10mg  po daily and Metoprolol succinate 25mg  po daily. On 8/14, it was noted his BP was ranging from 140-150s/60-90s. We changed metoprolol for coreg 3.125mg  po BID.   ??  Restless legs: We continued home mirapex 0.25 mg BID and home Lyrica 100 mg by mouth twice daily.    Depression and history of SI:??Doing well, denies SI at this time. We continued his home lamictal.      ??      Procedures:  PR REMOVAL OF LUNG,LOBECTOMY [32480] (REMOVAL OF LUNG, OTHER THAN PNEUMONECTOMY; SINGLE LOBE (LOBECTOMY))  ______________________________________________________________________    Discharge Day Services:  BP 140/94  - Pulse 108  - Temp 37.6 ??C (Oral)  - Resp 18  - Ht 180.3 cm (5' 11)  - Wt (!) 119.3 kg (262 lb 14.4 oz)  - SpO2 96%  - BMI 36.67 kg/m??    General: NAD  Cardio: Regular rhythm, no M/R/G  Pulm: Diminished breathsounds in right base minimal crackles throughout  Chest: Thoracotomy incision with steri-strips flaking off, no erythema or drainage. Former chest tubed site without erythema or drainage.   Pt seen on the day of discharge and determined appropriate for discharge.    Condition at Discharge: stable  ______________________________________________________________________  Discharge Medications:     Your Medication List      STOP taking these medications    alcohol swabs Padm     metoprolol succinate 25 MG 24 hr tablet  Commonly known as:  TOPROL-XL     NON FORMULARY     NON FORMULARY     traZODone 100 MG tablet  Commonly known as:  DESYREL        START taking these medications    carvedilol 3.125 MG tablet  Commonly known as:  COREG  Take 1 tablet (3.125 mg total) by mouth Two (2) times a day.     cholecalciferol (vitamin D3) 1,000 unit tablet  Take 1 tablet (1,000 Units total) by mouth daily.     magnesium oxide 400 mg tablet  Commonly known as:  MAG-OX  Take 2 tablets (800 mg total) by mouth  Two (2) times a day.     minocycline 100 MG capsule  Commonly known as:  MINOCIN,DYNACIN  Take 1 capsule (100 mg total) by mouth Two (2) times a day. for 7 days     MVW Complete (pediatric multivit 61-D3-vit K) 1,500-800 unit-mcg Cap  Take 1 capsule by mouth Two (2) times a day.     oxyCODONE 10 mg immediate release tablet  Commonly known as:  ROXICODONE  Take 1 tablet (10 mg total) by mouth every six (6) hours as needed. for up to 6 days     piperacillin-tazobactam 4.5 gram/100 mL Pgbk  Commonly known as:  ZOSYN  Infuse 100 mL (4.5 g total) into a venous catheter Every six (6) hours. for 8 days     tobramycin (PF) 300 mg/5 mL nebulizer solution  Commonly known as:  TOBI  Inhale 5 mL (300 mg total) by nebulization 2 (two) times a day.        CHANGE how you take these medications    albuterol 90 mcg/actuation inhaler  Commonly known as:  PROVENTIL HFA;VENTOLIN HFA  Inhale 2 puffs every six (6) hours as needed.  What changed:  Another medication with the same name was removed. Continue taking this medication, and follow the directions you see here.     amikacin 500 mg/2 mL injection  Commonly known as:  AMIKIN  Infuse 1200 mg (in a 250 mL NaCl) over 60 minutes between 7-9 am (mix per home infusion policy)  What changed:  You were already taking a medication with the same name, and this prescription was added. Make sure you understand how and when to take each.     amikacin 500 mg/2 mL injection  Commonly known as:  AMIKIN  Withdraw 2 mL (500mg ) and add to neb cup. Administer using nebulizer BID. Start after IV amikacin is finished  Start taking on:  04/13/2017  What changed:  ?? additional instructions  ?? These instructions start on 04/13/2017. If you are unsure what to do until then, ask your doctor or other care provider.     lamoTRIgine 25 MG tablet  Commonly known as:  LaMICtal  Take 1 tablet (25 mg total) by mouth Two (2) times a day. Then increase to BID after 1 week.  What changed:  when to take this     lipase-protease-amylase 36,000-114,000- 180,000 unit Cpdr  Commonly known as:  CREON  Take 7 capsules with meals and 5 capsules with snacks  What changed:  Another medication with the same name was added. Make sure you understand how and when to take each.     pancrelipase (Lip-Prot-Amyl) 24,000-76,000 -120,000 unit Cpdr  Commonly known as:  CREON  Take 8 capsules (192,000 units of lipase total) by mouth with snacks. Take 2 caps with meals, 1 cap with snacks  What changed:  You were already taking a medication with the same name, and this prescription was added. Make sure you understand how and when to take each.        CONTINUE taking these medications    azithromycin 500 MG tablet  Commonly known as:  ZITHROMAX  Take 500 mg by mouth daily.     budesonide-formoterol 160-4.5 mcg/actuation inhaler  Commonly known as:  SYMBICORT  Inhale 2 puffs Two (2) times a day.     cetirizine 10 MG tablet  Commonly known as:  ZyrTEC  Take 10 mg by mouth daily.     CLOFAZIMINE ORAL  Take 100 mg by  mouth daily.     dornase alfa 1 mg/mL nebulizer solution  Commonly known as:  PULMOZYME  Inhale 2.5 mg daily.     fluticasone 50 mcg/actuation nasal spray  Commonly known as:  FLONASE  2 sprays by Each Nare route daily.     lisinopril 10 MG tablet  Commonly known as:  PRINIVIL,ZESTRIL  Take 10 mg by mouth daily.     melatonin 3 mg Tab  Take 2 tablets (6 mg total) by mouth nightly.     montelukast 10 mg tablet  Commonly known as:  SINGULAIR  Take 10 mg by mouth nightly.     omeprazole 20 MG capsule  Commonly known as:  PriLOSEC  Take 20 mg by mouth daily.     PARI LC D NEBULIZER Misc  Generic drug:  nebulizers  Provide 1 device  for use with inhaled medication.     pramipexole 0.125 MG tablet  Commonly known as:  MIRAPEX  Take 0.25 mg by mouth daily.     pregabalin 100 MG capsule  Commonly known as:  LYRICA  Take 200 mg by mouth Two (2) times a day.     rivaroxaban 20 mg tablet  Commonly known as:  XARELTO  Take 20 mg by mouth daily with evening meal.     sodium bicarb-sodium chloride 700-2,300 mg Pack  Irrigate each nostril with 120 mL of solution (1 packet mixed with 240 mL of distilled water) BID     sodium chloride 7% 7 % Nebu  Inhale 4 mL by nebulization Two (2) times a day.     sodium chloride Soln  Commonly known as:  NS  Add 1mL NaCl into neb cup along with amikacin.     syringe with needle 3 mL 20 gauge x 1 Syrg  Commonly known as:  SYRINGE 3CC/20GX1  For use with inhaled antibiotic     tezacaftor-ivacaftor tablet  Commonly known as:  SYMDEKO  Take 1 tablet (tezacaftor 100mg /ivacaftor 150mg ) by mouth every AM and 1 tablet (ivacaftor 150mg ) every PM with fatty food     VIIBRYD ORAL  Take 20 mg by mouth daily. Titrating 10mg  daily x 1 week. ______________________________________________________________________  Pending Test Results (if blank, then none):   Order Current Status    AFB culture Preliminary result    Blood Culture #1 Preliminary result    Blood Culture #2 Preliminary result    Fungal Culture Preliminary result          Most Recent Labs:  Microbiology Results (last day)     Procedure Component Value Date/Time Date/Time    Blood Culture #2 [[id number]]  (Normal) Collected:  04/02/17 2240    Lab Status:  Preliminary result Specimen:  Blood from CVAD Implanted Updated:  04/05/17 2300     Blood Culture, Routine No Growth at 72 hours    Blood Culture #1 [[id number]]  (Normal) Collected:  04/02/17 2231    Lab Status:  Preliminary result Specimen:  Blood from Peripheral Updated:  04/05/17 2245     Blood Culture, Routine No Growth at 72 hours          Lab Results   Component Value Date    WBC 8.9 04/06/2017    HGB 10.0 (L) 04/06/2017    HCT 30.0 (L) 04/06/2017    PLT 483 (H) 04/06/2017       Lab Results   Component Value Date    NA 134 (L) 04/06/2017    K 4.8 04/06/2017  CL 97 (L) 04/06/2017    CO2 26.0 04/06/2017    BUN 16 04/06/2017    CREATININE 0.81 04/06/2017    CALCIUM 8.7 04/06/2017    MG 2.0 04/06/2017    PHOS 5.2 (H) 04/06/2017       Lab Results   Component Value Date    ALKPHOS 65 03/21/2017    BILITOT 0.3 03/21/2017    PROT 6.5 03/21/2017    ALBUMIN 3.6 03/21/2017    ALT 79 (H) 03/21/2017    AST 36 03/21/2017       Lab Results   Component Value Date    PT 11.1 03/29/2017    INR 0.97 03/29/2017     Hospital Radiology:  Xr Chest 2 Views    Result Date: 03/18/2017  EXAM: CHEST TWO VIEW DATE: 03/18/2017 5:43 PM ACCESSION: [id number] UN DICTATED: 03/18/2017 6:17 PM INTERPRETATION LOCATION: [facility] CLINICAL INDICATION: 31 years old Male with DYSPNEA--  COMPARISON: Chest radiograph dated 02/05/2017 TECHNIQUE: PA and lateral views of the chest. FINDINGS: Right chest wall port with tip terminating in the right atrium. Unchanged appearance of right upper lobe bronchiectasis. Lungs well-inflated. No new focal consolidation identified. No pleural effusion. No pneumothorax. Cardiomediastinal silhouette is unremarkable.      Sequelae of cystic fibrosis, similar to prior.      ______________________________________________________________________    Discharge Instructions:         Follow Up instructions and Outpatient Referrals     Ambulatory referral to Audiology       Amikacin and tobramycin         Ambulatory referral to Cardiothoracic Surgery       For staple removal 8/31         Ambulatory referral to Pulm Rehab       Do you want ongoing co-management?:  Yes    Cystic Fibrosis Patient s/p RUL lobectomy needing pulmonary rehab.         Call MD for:  redness, tenderness, or signs of infection (pain, swelling, redness, odor or green/yellow discharge around incision site)       Call MD for:  severe uncontrolled pain       Call MD for:  temperature >38.5 Celsius       Discharge instructions       You were admitted to [facility] for Cystic Fibrosis with Pulmonary Manifestations Exacerbation. You were treated with IV antibiotics and will continue them at home with home infusion. You also underwent Right Upper Lobectomy which was followed by a chest tube. For you incisions you may shower, but no vigorous scrubbing of the areas. Do not submerge your incisions in a bath or pool for two weeks. Please follow-up with Thoracic surgery for your staple removal.    Please carefully read and follow these instructions below upon your discharge:    Continue your Inhaled Tobramycin until 8/31, on 9/1 resume your inhaled amikacin.   Continue your IV amikacin, oral minocycline, and IV zosyn through 8/31.      1) Please take your medications as prescribed and note the changes listed on your discharge. At future follow-up appointments, please be sure to take all of your medications with you so your provider can better guide your care.     2) Seek medical care if you develop any changes in your mental status, worsening abdominal pain, fevers greater than 101.5, any unexplained/unrelieved shortness of breath, uncontrolled nausea and vomiting that keeps you from remaining hydrated or taking your medication, or any other concerning  symptoms. If following discharge from the hospital you notice the development or worsening of any symptoms such as nausea, vomiting, chest pain, shortness of breath, fevers, or chills, please return to the emergency department. If you develop these symptoms, or if you have trouble obtaining any of your medications, you can contact the [facility] at [phone], or you can go to the [facility] at [facility] in [place]. You can also call the [facility] Link at [phone]. Please continue to follow-up with your outpatient care providers. Some of your follow-up appointments have been listed below. Please be sure to attend these appointments at the dates and times indicated.    3) Please attend pulmonary rehab in [facility] - a referral was made.    4) Please follow up with Thoracic surgery for your staple removal about 2 weeks after your surgery (8/31) - they will call you for this appt    5) Audiology (hearing) referral - they will call you to set this up               Appointments which have been scheduled for you    Jun 19, 2017 12:00 PM EST  RETURN PFT 15 with [place] PFT 4  [facility] PULMONARY SPECIALTY FUNCT [place] [facility] REGION) [address]  [address]  [place] [address]  [phone]   Jun 19, 2017 12:30 PM EST  RETURN CYSTIC FIBROSIS with [name], MD  [facility] PULMONARY SPECIALTY CL [place] [facility] REGION) [address]  [address]  [place] [address]  [phone]          Length of Discharge: I spent greater than 30 mins in the discharge of this patient.    Attending attestation:  I saw the patient on the day of discharge and agree with the discharge plans and disposition. Pt feeling much better as compared to admission. Referral made to [facility] Pulmonary Rehab program and audiology testing. Viona Gilmore, MD, MPH

## 2017-04-07 NOTE — Unmapped (Signed)
Physician Discharge Summary    Identifying Information:   Christian Young  January 23, 1986  [id number]    Admit date: 03/18/2017    Discharge date: 04/06/2017     Discharge Service: Pulmonology (MDG)    Discharge Attending Physician: No att. providers found    Discharge to: Home with home infusion    Discharge Diagnoses:  Principal Problem:    Cystic fibrosis with pulmonary exacerbation (CMS-HCC)  Active Problems:    Essential hypertension    History of DVT (deep vein thrombosis)    Cystic fibrosis with intestinal manifestation (CMS-HCC)    Chronic pansinusitis    Pancreatic insufficiency due to cystic fibrosis (CMS-HCC)    Mycobacterium abscessus infection  Resolved Problems:    * No resolved hospital problems. *      Outpatient Provider Follow Up Issues:   1. Please attend pulmonary rehab  2. Please see audiology for ototoxicity evaluation in light of amikacin and tobramycin use  3. Please follow-up with Thoracic Surgery for staple removal 2 weeks after procedure.  4. Antibiotic monitoring labs as below: 8/25-8/31  -Ceftazidime/Cefepime/Zosyn/Aztreonam-ONCE WEEKLY: CBC with diff, BUN, SCr  -Amikacin (Daily Dosing): ONCE WEEKLY: amikacin random level (drawn 7 hrs after end of infusion time), CBC with diff; TWICE WEEKLY: BUN, SCr, Magnesium    Hospital Course:   Christian Young??is a 31 y.o.??y/o male??with PMHx of CF [phone] mutation with pancreatic insufficiency, CF sinus disease, HTN, depression, history of DVTs on chronic anticoagulation who??presents to [facility] with Cystic fibrosis with pulmonary exacerbation. Hospital Course is outlined below by problem list:    CF with Bronchiectasis Exacerbation:??Diagnosed at age 31. Most recent PFTs 02/21/17 showed FEV1 74% which was at baseline if not better than previous. Sputum culture from 03/18/17??with smooth Pseudumonas (S: tobra, Levo/Cipro; I: Aztreonam, Zosyn; R: amikacin, cefepime, ceftazidime, imipenem/meropenem). Of note he is also being treated for macrolide-sensitive M abscessus with azithro, clofazimine and inhaled amikacin, tentative end date of 11/2017 per his primary pulmonologist, Dr. Koren Shiver', note. He was started on ceftazidime, and amikacin on 8/7-8/20. Subsequent sputum culture from 8/17 was positive for stenotrophomonas maltophilia (R: Ceftazidime, S: Levofloxacin, Minocycline, and Trimethoprim + Sulfamethoxazole). He became febrile on 8/19 after his surgical procedure as below.  IV zosyn and inhaled tobramycin were added for further pseudomonal coverage and ceftazidime was discontinued. Minocycline was added for stenotrophomonas. Plan to continue IV Zosyn, IV amikacin, oral minocycline, and inhaled Tobramycin until 8/31. Then transition to inhaled amikacin. He is interested in pulmonary rehab program after discharge. He also needs audiometry testing.    Right Upper Lobectomy: As Mr. Dirocco has had ~8 admissions for exacerbations requiring IV antibiotics the past year. His CT demonstrated significant scarring and volume loss in RUL, and perfusion scan demonstrates decreased perfusion to this lobe. Given concern his RUL was likely contributing to recurrent infections without contributing to ventilation/perfusion, he underwent RUL lobectomy 8/17. He subsequently had a small Right apical pneumothorax for which a chest tube was placed. On 8/19 he became febrile. Given 8/17 culture results he was started on zosyn and inhaled tobramycin, along  With continuing M abscessus meds and adding minocycline for stenotrophomonas.  The chest tube was removed on 8/21 after resolution of pneumothorax. He required an epidural and PCA for post op pain control. He was noted to have an elevated right hemi-diaphragm on XR with concern for pleural effusion on 08/22. U/S by Interventional Pulmonology revealed a pleural effusion too small to drain and a reduced mobility of the right hemidiaphragm. He was  successfully transitioned to PO pain medicines on 8/23. He will require staple removal from his incision 2 weeks post op.      History of DVTs:??Per history may be both provoked and unprovoked, on chronic Xarelto 20mg  at home. Had not taken Xarelto since approximately 1 week prior to admission 2/2 inability to fill prescription. Held xarelto on admission in setting of hemoptysis. Gave Heparin SQ at DVT ppx dosing to provide some anticoagulation. Per surgery his home Xarelto was resumed 8/23.   ??  Acute on Chronic Sinusitis: Prescribed Augmentin on 8/2 by PCP. CT sinus in May 2018 showed sequelae of previous sinus surgery and bilateral mucosal thickening. We continued home flonase, zyrtec, singulair.   ??  Pancreatic insufficiency 2/2 CF:??Wt 253lb (baseline 255-265) on admission. We continued home Creon 7 caps with meals, 5 with snacks (home Creaon which has 36,000 lipase per pill, re-ordered this for meals if he has, but could not re-order with snacks so ordered the 24,000 lipase pill for snacks, will need to convert meal time to appropriate 11 caps with meals). His Pancreatic elastase was <59mcg/g  On discharge he will  resume home Creon 36,000 after discharge. When discharged home resume usual Complete Formulation Vit D5000 softgels, 2 daily. Provides total of 10,000 International units vitamin D daily  ??  HTN:??Patient was hypertensive at 140/99 on admission. We continued his home Lisinopril 10mg  po daily and Metoprolol succinate 25mg  po daily. On 8/14, it was noted his BP was ranging from 140-150s/60-90s. We changed metoprolol for coreg 3.125mg  po BID.   ??  Restless legs: We continued home mirapex 0.25 mg BID and home Lyrica 100 mg by mouth twice daily.    Depression and history of SI:??Doing well, denies SI at this time. We continued his home lamictal.      ??      Procedures:  PR REMOVAL OF LUNG,LOBECTOMY [32480] (REMOVAL OF LUNG, OTHER THAN PNEUMONECTOMY; SINGLE LOBE (LOBECTOMY))  ______________________________________________________________________    Discharge Day Services:  BP 140/94  - Pulse 108  - Temp 37.6 ??C (Oral)  - Resp 18  - Ht 180.3 cm (5' 11)  - Wt (!) 119.3 kg (262 lb 14.4 oz)  - SpO2 96%  - BMI 36.67 kg/m??    General: NAD  Cardio: Regular rhythm, no M/R/G  Pulm: Diminished breathsounds in right base minimal crackles throughout  Chest: Thoracotomy incision with steri-strips flaking off, no erythema or drainage. Former chest tubed site without erythema or drainage.   Pt seen on the day of discharge and determined appropriate for discharge.    Condition at Discharge: stable  ______________________________________________________________________  Discharge Medications:     Your Medication List      STOP taking these medications    alcohol swabs Padm     metoprolol succinate 25 MG 24 hr tablet  Commonly known as:  TOPROL-XL     NON FORMULARY     NON FORMULARY     traZODone 100 MG tablet  Commonly known as:  DESYREL        START taking these medications    carvedilol 3.125 MG tablet  Commonly known as:  COREG  Take 1 tablet (3.125 mg total) by mouth Two (2) times a day.     cholecalciferol (vitamin D3) 1,000 unit tablet  Take 1 tablet (1,000 Units total) by mouth daily.     magnesium oxide 400 mg tablet  Commonly known as:  MAG-OX  Take 2 tablets (800 mg total) by mouth Two (2)  times a day.     minocycline 100 MG capsule  Commonly known as:  MINOCIN,DYNACIN  Take 1 capsule (100 mg total) by mouth Two (2) times a day. for 7 days     MVW Complete (pediatric multivit 61-D3-vit K) 1,500-800 unit-mcg Cap  Take 1 capsule by mouth Two (2) times a day.     oxyCODONE 10 mg immediate release tablet  Commonly known as:  ROXICODONE  Take 1 tablet (10 mg total) by mouth every six (6) hours as needed. for up to 6 days     piperacillin-tazobactam 4.5 gram/100 mL Pgbk  Commonly known as:  ZOSYN  Infuse 100 mL (4.5 g total) into a venous catheter Every six (6) hours. for 8 days     tobramycin (PF) 300 mg/5 mL nebulizer solution  Commonly known as:  TOBI  Inhale 5 mL (300 mg total) by nebulization 2 (two) times a day.        CHANGE how you take these medications    albuterol 90 mcg/actuation inhaler  Commonly known as:  PROVENTIL HFA;VENTOLIN HFA  Inhale 2 puffs every six (6) hours as needed.  What changed:  Another medication with the same name was removed. Continue taking this medication, and follow the directions you see here.     amikacin 500 mg/2 mL injection  Commonly known as:  AMIKIN  Infuse 1200 mg (in a 250 mL NaCl) over 60 minutes between 7-9 am (mix per home infusion policy)  What changed:  You were already taking a medication with the same name, and this prescription was added. Make sure you understand how and when to take each.     amikacin 500 mg/2 mL injection  Commonly known as:  AMIKIN  Withdraw 2 mL (500mg ) and add to neb cup. Administer using nebulizer BID. Start after IV amikacin is finished  Start taking on:  04/13/2017  What changed:  ?? additional instructions  ?? These instructions start on 04/13/2017. If you are unsure what to do until then, ask your doctor or other care provider.     lamoTRIgine 25 MG tablet  Commonly known as:  LaMICtal  Take 1 tablet (25 mg total) by mouth Two (2) times a day. Then increase to BID after 1 week.  What changed:  when to take this     lipase-protease-amylase 36,000-114,000- 180,000 unit Cpdr  Commonly known as:  CREON  Take 7 capsules with meals and 5 capsules with snacks  What changed:  Another medication with the same name was added. Make sure you understand how and when to take each.     pancrelipase (Lip-Prot-Amyl) 24,000-76,000 -120,000 unit Cpdr  Commonly known as:  CREON  Take 8 capsules (192,000 units of lipase total) by mouth with snacks. Take 2 caps with meals, 1 cap with snacks  What changed:  You were already taking a medication with the same name, and this prescription was added. Make sure you understand how and when to take each.        CONTINUE taking these medications    azithromycin 500 MG tablet  Commonly known as:  ZITHROMAX  Take 500 mg by mouth daily.     budesonide-formoterol 160-4.5 mcg/actuation inhaler  Commonly known as:  SYMBICORT  Inhale 2 puffs Two (2) times a day.     cetirizine 10 MG tablet  Commonly known as:  ZyrTEC  Take 10 mg by mouth daily.     CLOFAZIMINE ORAL  Take 100 mg by mouth daily.  dornase alfa 1 mg/mL nebulizer solution  Commonly known as:  PULMOZYME  Inhale 2.5 mg daily.     fluticasone 50 mcg/actuation nasal spray  Commonly known as:  FLONASE  2 sprays by Each Nare route daily.     lisinopril 10 MG tablet  Commonly known as:  PRINIVIL,ZESTRIL  Take 10 mg by mouth daily.     melatonin 3 mg Tab  Take 2 tablets (6 mg total) by mouth nightly.     montelukast 10 mg tablet  Commonly known as:  SINGULAIR  Take 10 mg by mouth nightly.     omeprazole 20 MG capsule  Commonly known as:  PriLOSEC  Take 20 mg by mouth daily.     PARI LC D NEBULIZER Misc  Generic drug:  nebulizers  Provide 1 device  for use with inhaled medication.     pramipexole 0.125 MG tablet  Commonly known as:  MIRAPEX  Take 0.25 mg by mouth daily.     pregabalin 100 MG capsule  Commonly known as:  LYRICA  Take 200 mg by mouth Two (2) times a day.     rivaroxaban 20 mg tablet  Commonly known as:  XARELTO  Take 20 mg by mouth daily with evening meal.     sodium bicarb-sodium chloride 700-2,300 mg Pack  Irrigate each nostril with 120 mL of solution (1 packet mixed with 240 mL of distilled water) BID     sodium chloride 7% 7 % Nebu  Inhale 4 mL by nebulization Two (2) times a day.     sodium chloride Soln  Commonly known as:  NS  Add 1mL NaCl into neb cup along with amikacin.     syringe with needle 3 mL 20 gauge x 1 Syrg  Commonly known as:  SYRINGE 3CC/20GX1  For use with inhaled antibiotic     tezacaftor-ivacaftor tablet  Commonly known as:  SYMDEKO  Take 1 tablet (tezacaftor 100mg /ivacaftor 150mg ) by mouth every AM and 1 tablet (ivacaftor 150mg ) every PM with fatty food     VIIBRYD ORAL  Take 20 mg by mouth daily. Titrating 10mg  daily x 1 week. ______________________________________________________________________  Pending Test Results (if blank, then none):   Order Current Status    AFB culture Preliminary result    Blood Culture #1 Preliminary result    Blood Culture #2 Preliminary result    Fungal Culture Preliminary result          Most Recent Labs:  Microbiology Results (last day)     Procedure Component Value Date/Time Date/Time    Blood Culture #2 [[id number]]  (Normal) Collected:  04/02/17 2240    Lab Status:  Preliminary result Specimen:  Blood from CVAD Implanted Updated:  04/05/17 2300     Blood Culture, Routine No Growth at 72 hours    Blood Culture #1 [[id number]]  (Normal) Collected:  04/02/17 2231    Lab Status:  Preliminary result Specimen:  Blood from Peripheral Updated:  04/05/17 2245     Blood Culture, Routine No Growth at 72 hours          Lab Results   Component Value Date    WBC 8.9 04/06/2017    HGB 10.0 (L) 04/06/2017    HCT 30.0 (L) 04/06/2017    PLT 483 (H) 04/06/2017       Lab Results   Component Value Date    NA 134 (L) 04/06/2017    K 4.8 04/06/2017    CL 97 (L) 04/06/2017  CO2 26.0 04/06/2017    BUN 16 04/06/2017    CREATININE 0.81 04/06/2017    CALCIUM 8.7 04/06/2017    MG 2.0 04/06/2017    PHOS 5.2 (H) 04/06/2017       Lab Results   Component Value Date    ALKPHOS 65 03/21/2017    BILITOT 0.3 03/21/2017    PROT 6.5 03/21/2017    ALBUMIN 3.6 03/21/2017    ALT 79 (H) 03/21/2017    AST 36 03/21/2017       Lab Results   Component Value Date    PT 11.1 03/29/2017    INR 0.97 03/29/2017     Hospital Radiology:  Xr Chest 2 Views    Result Date: 03/18/2017  EXAM: CHEST TWO VIEW DATE: 03/18/2017 5:43 PM ACCESSION: [id number] UN DICTATED: 03/18/2017 6:17 PM INTERPRETATION LOCATION: [facility] CLINICAL INDICATION: 31 years old Male with DYSPNEA--  COMPARISON: Chest radiograph dated 02/05/2017 TECHNIQUE: PA and lateral views of the chest. FINDINGS: Right chest wall port with tip terminating in the right atrium. Unchanged appearance of right upper lobe bronchiectasis. Lungs well-inflated. No new focal consolidation identified. No pleural effusion. No pneumothorax. Cardiomediastinal silhouette is unremarkable.      Sequelae of cystic fibrosis, similar to prior.      ______________________________________________________________________    Discharge Instructions:               Follow Up instructions and Outpatient Referrals     Ambulatory referral to Audiology       Amikacin and tobramycin         Ambulatory referral to Cardiothoracic Surgery       For staple removal 8/31         Ambulatory referral to Pulm Rehab       Do you want ongoing co-management?:  Yes    Cystic Fibrosis Patient s/p RUL lobectomy needing pulmonary rehab.         Call MD for:  redness, tenderness, or signs of infection (pain, swelling, redness, odor or green/yellow discharge around incision site)       Call MD for:  severe uncontrolled pain       Call MD for:  temperature >38.5 Celsius       Discharge instructions       You were admitted to [facility] for Cystic Fibrosis with Pulmonary Manifestations Exacerbation. You were treated with IV antibiotics and will continue them at home with home infusion. You also underwent Right Upper Lobectomy which was followed by a chest tube. For you incisions you may shower, but no vigorous scrubbing of the areas. Do not submerge your incisions in a bath or pool for two weeks. Please follow-up with Thoracic surgery for your staple removal.    Please carefully read and follow these instructions below upon your discharge:    Continue your Inhaled Tobramycin until 8/31, on 9/1 resume your inhaled amikacin.   Continue your IV amikacin, oral minocycline, and IV zosyn through 8/31.      1) Please take your medications as prescribed and note the changes listed on your discharge. At future follow-up appointments, please be sure to take all of your medications with you so your provider can better guide your care.     2) Seek medical care if you develop any changes in your mental status, worsening abdominal pain, fevers greater than 101.5, any unexplained/unrelieved shortness of breath, uncontrolled nausea and vomiting that keeps you from remaining hydrated or taking your medication, or any other concerning symptoms.  If following discharge from the hospital you notice the development or worsening of any symptoms such as nausea, vomiting, chest pain, shortness of breath, fevers, or chills, please return to the emergency department. If you develop these symptoms, or if you have trouble obtaining any of your medications, you can contact the Internal Medicine Same Day Clinic at [phone], or you can go to the [facility] at [facility] in [place]. You can also call the [facility] Link at [phone]. Please continue to follow-up with your outpatient care providers. Some of your follow-up appointments have been listed below. Please be sure to attend these appointments at the dates and times indicated.    3) Please attend pulmonary rehab in [facility] - a referral was made.    4) Please follow up with Thoracic surgery for your staple removal about 2 weeks after your surgery (8/31) - they will call you for this appt    5) Audiology (hearing) referral - they will call you to set this up               Appointments which have been scheduled for you    Jun 19, 2017 12:00 PM EST  RETURN PFT 15 with [place] PFT 4  [facility] [place] [facility]) [address]  [address]  [place] [address]  [phone]   Jun 19, 2017 12:30 PM EST  RETURN CYSTIC FIBROSIS with [name], MD  [facility] PULMONARY SPECIALTY CL [place] [facility]) [address]  [address]  [place] [address]  [phone]          Length of Discharge: I spent greater than 30 mins in the discharge of this patient.

## 2017-04-08 NOTE — Unmapped (Signed)
Pharmacy called needing prior authorization answers to some questions so the patient's insurance will cover their medications.  I apologize, I forgot to get the list of medications, but I see you prescribed medications last week for this patient.  They do not have any visits in Internal Medicine, but I'm hoping you can call [phone].  She said anyone who picks up can take a verbal PA.  She said this process is quicker than faxing the PA form here for you to complete.  Thanks.

## 2017-04-09 NOTE — Unmapped (Signed)
Called [phone] to complete prior authorization, however their system was down and they were unable to even see what medication needed a PA. I left them my cell phone number to call me back when their system is up and the PA can be completed.

## 2017-04-09 NOTE — Unmapped (Signed)
Prior authorization # [id number] for tobramycin nebulizer via [facility]; awaiting pharmacy approval in the next 24H

## 2017-04-09 NOTE — Unmapped (Signed)
Adult Cystic Fibrosis Clinic Pharmacist Note: Outpatient IV Antibiotic Therapy/Monitoring    Amikacin random level collected of new dose. Patient's amikacin dose was increased on 04/05/17 prior to discharge from 1000mg  to 1200mg  daily to aim for est peak 43 mcg/mL (goal 35-45 mcg/mL).    Plan:  -Continue amikacin IV 1200mg  daily for now, renal function appropriate.    Please collect next labs on 04/11/17:  -BUN/SCr, Magnesium  -Can't really confirm peak levels since patient is dosing at 11pm at night  -IF patient extends IV amikacin, please have him adjust dose to morning time to get peak     [name], PharmD, BCPS, CPP  Clinical Pharmacist Practitioner   [facility] Adult Cystic Fibrosis Clinic / [facility] Bronchiect[facility]  Pager: [phone]      CC: Lesia Sago, RN and Norma Fredrickson, RN

## 2017-04-09 NOTE — Unmapped (Signed)
Pulmonary Clinic Note: Outpatient Lab Results from Tuesday, August 28     Outpatient IV Antimicrobial Therapy:  Start Date: 03/18/17  End Date: 04/13/17  Line: PORT  Referring pulmonologist: [name]    IV Antibiotics Ordered:  -Piperacillin-tazobactam (Zosyn) 4.5g IV every 6 hours  -Amikacin 1200mg  IV every 24 hours    Oral/Inhaled Antibiotics:  -Minocycline 100mg  by mouth twice a day  -Inhaled tobramycin 300mg  via nebulizer twice a day       GENERAL LABS: see labs in Care Everywhere     [facility]   Home  8/28 [facility]  8/25   BUN  19 16   SCR  0.92 0.81   MG  2.0     CBC    Home  8/28 Hosp   8/25   WBC 8.4 8.9   HGB  10.5 10.0   HCT 32.5 30.0   PLT  538 483       DRUG LEVELS:  Measured Drug Levels:   -Amikacin Random: 4.1 mcg/mL (Collected Date/Time: 8/28 @ 0730)     ---------------------------------------------------------------------------------------------------  Patient was called to discuss start and stop time of Amikacin on date of drug level collection.    Patient Reported Administration Time:   Start of infusion: 2300   End of infusion: 2330     PLAN     1. Results note forwarded to: [name]  2. Drug levels forwarded to CPP Michel Santee, RN

## 2017-04-09 NOTE — Unmapped (Signed)
Spoke with pharmacist at [facility]. Nurse to collect labs on Friday. Will check in with [facility] Wednesday or Thursday to determine end of therapy date.

## 2017-04-11 NOTE — Unmapped (Signed)
Patient left message on CF nurse office line stating that his chest tube site is red (see patient photo in [facility]). I spoke with the patient and reports no fever, no swelling, no itching or burning at the site.  Patient reports otherwise feeling fine, no other symptoms. Patient states that he has been doing dressing changes as directed and noticed today that the site was red for the first time since discharge. Patient attached a photo via My Chart. I reviewed the message/photo with Dr. Garner Nash while in clinic and she requested that I page the surgery nurse practioner Lorna Dibble. She also asked that I instruct the patient to circle the site in order to monitor change in size of redness. Have paged the surgery NP and will call Christian Young back and explain how to monitor the size and to encourage him to also reach out to his surgery providers.  Patient verbalized understanding and had no other needs at this time. Will also notify Dr. Koren Shiver.

## 2017-04-11 NOTE — Unmapped (Signed)
Patient concerned regarding redness around chest tube suture site.  He denies fevers, chills, drainage, erythema, edema, shortness of breath, or burning.  Suggested cleaning all incisions with soap and water, pat dry, do not apply anything to the incisions, and keep open to air.   Instructed patient to call with any concerns.

## 2017-04-12 NOTE — Unmapped (Signed)
Patient called to discuss end of treatment, as he is due to complete IV antibiotics tomorrow. Reports feeling a lot better and ready to end as scheduled. Wife comfortable with de-accessing port. [facility] aware, and has Port care orders current for the patient.     Patient requesting pulmonary rehab referral to be faxed to hospital in [place], as it is easier for him to get to. Referral placed and  faxed to [facility] ph: [phone] and fax [phone].    Patient aware of upcoming appointments with [facility].

## 2017-04-16 MED ORDER — SODIUM CHLORIDE 7 % FOR NEBULIZATION
Freq: Two times a day (BID) | RESPIRATORY_TRACT | 3 refills | 0 days | Status: CP
Start: 2017-04-16 — End: 2018-04-17

## 2017-04-16 NOTE — Unmapped (Signed)
[facility]  Adult Cystic Fibrosis Clinic    As part of [facility] [employer], [place] mailed patient [phone] gift card for food due to financial difficulties and associated food insecurity.        Mercy Moore, LCSW

## 2017-04-17 MED ORDER — DORNASE ALFA 1 MG/ML SOLUTION FOR INHALATION
Freq: Every day | RESPIRATORY_TRACT | 3 refills | 0.00000 days | Status: SS
Start: 2017-04-17 — End: 2018-01-01

## 2017-04-17 NOTE — Unmapped (Signed)
[facility] Refill Coordination Note: Pulmonary         Christian Young, DOB: July 07, 1986    Medication Adherence    Patient reported X missed doses in the last month:  0  Specialty Medication:  SYMEDKO QOH 1 WEEK    Patient is on additional specialty medications:  Yes  Additional Specialty Medications:  AMIKACIN KIT QOH ABOUT 1 WEEK   Patient Reported Additional Medication X Missed Doses in the Last Month:  0  Patient is on more than two specialty medications:  No  Informant:  patient  Support network for adherence:  family member  Confirmed plan for next specialty medication refill:  delivery by pharmacy  Medication Assistance Program  Refill Coordination  Has the Patient's Contact Information Changed:  No  Is the Shipping Address Different:  No  Shipping Information  Delivery Scheduled:  Yes  Delivery Date:  04/23/17  Medications to be Shipped:      MEDICATIONS    -Symdeko 100/150mg  and 150mg   -Inhaled amikacin 500mg  and kit: sodium chloride 0.9%, syringes with needles, Pari nebulizer cup, alcohol swabs                   -Confirmed the medication and dosage are correct and have not changed: Yes, regimen is correct and unchanged.             ADDITIONAL NOTES          N/A                     Jolene Schimke  Specialty Pharmacy Technician

## 2017-04-18 NOTE — Unmapped (Signed)
Confirmed information necessary to complete pulmonary rehab referral @[facility] [phone]. Faxed records to [phone].

## 2017-04-20 MED FILL — ALCOHOL PADS//: ALCOHOL PADS// | 50 days supply | Qty: 1 | Fill #3

## 2017-04-20 MED FILL — PARI LC PLUS NEBULIZER SET//MISC: PARI LC PLUS NEBULIZER SET//MISC | 30 days supply | Qty: 1 | Fill #3

## 2017-04-20 MED FILL — SYMDEKO/100-150/TBPK: SYMDEKO/100-150/TBPK | 28 days supply | Qty: 56 | Fill #3

## 2017-04-20 MED FILL — AMIKACIN SULFATE/500/2ML/SOLN: AMIKACIN SULFATE/500/2ML/SOLN | 30 days supply | Qty: 120 | Fill #3

## 2017-04-20 MED FILL — BD IM 3mL/22G 1 INCH/SYR: BD IM 3mL/22G 1 INCH/SYR | 30 days supply | Qty: 60 | Fill #3

## 2017-04-20 MED FILL — NORMAL SALINE FLUSH/0.9%/SOLN: NORMAL SALINE FLUSH/0.9%/SOLN | 30 days supply | Qty: 60 | Fill #3

## 2017-04-23 ENCOUNTER — Ambulatory Visit
Admission: RE | Admit: 2017-04-23 | Discharge: 2017-04-23 | Disposition: A | Discharge status: Home / Self Care | Payer: BC Managed Care – PPO | Attending: Adult Health | Admitting: Adult Health

## 2017-04-23 ENCOUNTER — Ambulatory Visit
Admission: RE | Admit: 2017-04-23 | Discharge: 2017-04-23 | Disposition: A | Discharge status: Home / Self Care | Payer: BC Managed Care – PPO

## 2017-04-23 DIAGNOSIS — Z09 Encounter for follow-up examination after completed treatment for conditions other than malignant neoplasm: Principal | ICD-10-CM

## 2017-04-23 NOTE — Unmapped (Signed)
ERROR MEDICATIONS REFILLED 04/20/17

## 2017-04-24 NOTE — Unmapped (Signed)
Thoracic Surgery Clinic Follow-up Note      Assessment/Plan:     Christian Young is a 31 y.o. Caucasian male with history of Cystic fibrosis and ~8 admissions for exacerbations requiring IV antibiotics in the past year, with significant scarring and volume loss in Right Upper Lobe (RUL) per CT scan, perfusion scan demonstrates decreased perfusion to this lobe causing concern that his RUL was likely contributing to recurrent infections without contributing to ventilation/perfusion, he underwent RUL lobectomy 8/17 and returns today for routine postoperative follow-up. I remove his chest tube sutures without difficulty.   I reviewed his pathology - Respiratory mucosa without squamous metaplasia, - Bronchiectasis, lumenal mucin, - No peribronchial vascular granulomatous inflammation identified; Alveolar parenchyma:  Atelectasis, DIP-like reaction pattern, present in en face lobectomy margin (B1).  Chest X-Ray performed today demonstrated Slightly decreased small right pleural effusion. No pneumothorax.  Since discharge, Christian Young has been recovering well, with no complaints.  His right lateral chest incision with 3 mm X 8 mm opening with slough present.  I showed his significant other how to cleaned with normal saline, dressed with wet to dry dressing.  I instructed this to be performed two times a day and to call with any questions or issues.  She was able to return demonstration.  He may return to Thoracic Surgery Clinic as needed.      Plan:       - wet to dry dressing two times a day to right lateral chest incision for about 1          week. Supplies given.         - Return to Thoracic Surgery Clinic as needed    Subjective     Christian Young is a 31 y.o. Caucasian male with history of Cystic fibrosis and ~8 admissions for exacerbations requiring IV antibiotics in the past year, with significant scarring and volume loss in Right Upper Lobe Lung (RUL) per CT scan, and perfusion scan demonstrates decreased perfusion to this lobe causing concern his RUL was likely contributing to recurrent infections without contributing to ventilation/perfusion, he underwent RUL lobectomy 8/17 and returns today for routine postoperative follow-up.  Since discharge, Christian Young has been feeling well with no complaints.  He denies fevers, chills, increased cough, chest pain, increased shortness of breath, hemoptysis, nausea, vomiting, and constipation.  He starts Pulmonary Rehab within the week.      Objective     Physical Exam:  Vitals:    04/23/17 1050   BP: 135/78   Pulse: 107   Resp: 18   Temp: 36.6 ??C (97.9 ??F)   SpO2: 95%     General: Well-developed, well-nourished male sitting in exam room in no acute distress.  HEENT: Normocephalic, sclera anicteric, EOMI, mucous membranes moist.  Neck: supple, trachea midline  Respiratory: Normal work of breathing on room air. No audible wheezes, right lateral chest incision with 3 mm X 8 mm opening with slough, all other incisions clean, dry, and well approximated  CVS: Regular rate and rhythm. No edema.  GI: Soft, non-tender, non-distended  Ext: Warm and well-perfused   Neuro: Awake, alert and oriented x3, no focal deficits  Psych: Speech clear and coherent. Mood and affect appropriate    Data Review:  Imaging: Chest X-ray personally reviewed  - Slightly decreased small right pleural effusion. No pneumothorax

## 2017-04-24 NOTE — Unmapped (Signed)
Received notice from [facility] that patient didn't show up for his appointment and wasn't available to reschedule. Spoke with patient, he stated his wife had school that day and he forgot to notify the clinic. He would like to be seen at this clinic. New referral placed. Audiology clinic has already reached out to patient and is scheduled.

## 2017-04-29 MED ORDER — SULFAMETHOXAZOLE 800 MG-TRIMETHOPRIM 160 MG TABLET
ORAL_TABLET | Freq: Two times a day (BID) | ORAL | 0 refills | 0 days | Status: CP
Start: 2017-04-29 — End: 2017-05-13

## 2017-04-29 NOTE — Unmapped (Signed)
Patient messaged provider over the weekend:    Hello dr Mickie Bail,     I am writing to let you know I believe I am coming down with a sinus infection or something, the reason I say this is cause two people in my house are sick. And they have not been covering their mouths when the cough or sneeze. I woke up today with really bad ear pain and stuffy nose. I was wondering if maybe you can call me in an oral antibiotics or something.     Spoke with patient on the phone.     Date of symptom onset:   Symptoms started yesterday, Sunday. Wife and dad are sick right now.     Symptoms include:   Pulmonary-    Increased cough unknown, does have a cough, unsure if it is related to surgery.    Productive cough no   Hemoptysis  no   Chest pain  no   Chest tightness unknown, shortness of breath, unsure if it related to surgery.    Fever  no   Chills  no   Nightsweats no   Change in appetite no   Other:  nasal/sinus congestion, going down the back of the throat. Green snot coming out. Left ear >right ear pain     Current measures they are taken:   Able to perform regular AC & breathing treatments: yes   Aerobika and Vest twice a day    Requiring additional: yes MDI albuterol during the day sometimes     Currently on inhaled abx: yes, Amikacin     Last culture results:   03/29/17: Right upper lobe from surgery  3+ Stenotrophomonas maltophilia      03/18/17: Sputum Culture   4+ Oropharyngeal Flora Isolated   2+ Smooth Pseudomonas aeruginosa      Last course of antibiotics: 04/05/17  Zosyn and Minocycline     Should medications be indicated, [facility] [place].

## 2017-05-14 MED ORDER — CARVEDILOL 3.125 MG TABLET
ORAL_TABLET | Freq: Two times a day (BID) | ORAL | 0 refills | 0 days | Status: SS
Start: 2017-05-14 — End: 2017-06-05

## 2017-05-15 NOTE — Unmapped (Signed)
[facility]  Adult Cystic Fibrosis Clinic    As part of [facility] CF [employer], [place] mailed patient [phone] gift card for food due to financial difficulties and associated food insecurity.    Patient aware of 06/19/17 appointment at [facility] and is planning to attend.        Mercy Moore, LCSW

## 2017-05-15 NOTE — Unmapped (Signed)
Adult Cystic Fibrosis Clinic Pharmacist Note: [place] PAP    Called [place] PAP and was able to get patient 14-day temporary PAP benefit. Will f/u with him about needing to submit application if he thinks he'll need other supplies beyond 14-day.  CPP submitted paperwork for non-formulary PAP request for Creon. Current formulary agent Pancreaze, highest strength is 21,000 and this would be a very high pill burden per meal.  Consulted with CF dietician and his dose with Pancreaze 21,000 would be 12 caps with meals/10 caps with snacks, or a TDD 66 caps per day versus currently he's on Creon 36,000 and doing 7 caps with meals/5 caps with snacks, so 36 caps per day. Once that approval is through, will have [facility] send 14-day supply to patient tomorrow.      [name], PharmD, BCPS, CPP  Clinical Pharmacist Practitioner  [facility] Adult Cystic Fibrosis Clinic / [facility] Bronchiectasis Clinic  Pager: [phone]

## 2017-05-15 NOTE — Unmapped (Signed)
Adult Cystic Fibrosis Clinic Pharmacist Note: No insurance    Spoke with Christian Young and he has no insurance. Was on wife's insurance and her plan needed their marriage certificate. Christian Young said they never notified them that they needed that information so they dropped him from his insurance.  Had Christian Young look at his medication supply. He has 1 box of Symdeko, good on this amikacin and Pulmozyme but he is out of his Creon 36's. Told him I would work on trying to find a way to get him enzymes.  He did not tolerate Zenpep while inpatient per [facility]. So if we did [place] PAP 14-day bridge supply would need to do a non-formulary request.  Christian Young said it could take up to 30 days to get him reinstated.     Will follow-up with patient once we find some type of bridge for him.    [name], PharmD, BCPS, CPP  Clinical Pharmacist Practitioner  [facility] Adult Cystic Fibrosis Clinic / [facility] Bronchiectasis Clinic  Pager: [phone]      CC: [name], Serita Grit (CF social worker), Okey Dupre (CF dietician), Norma Fredrickson, RN

## 2017-05-15 NOTE — Unmapped (Signed)
Addended by: Alton Revere T on: 05/15/2017 02:33 PM     Modules accepted: Orders

## 2017-05-15 NOTE — Unmapped (Signed)
PATIENT NO LONGER HAS INSURANCE, IS WAITING TO BE ADDED BACK TO HIS WIFES POLICY RESCHEDULED CALL FOR 2 WEEKS TO CHECK IN.

## 2017-05-16 MED ORDER — LIPASE-PROTEASE-AMYLASE 36,000-114,000-180,000 UNIT CAPSULE,DELAY REL
ORAL_CAPSULE | ORAL | 0 refills | 0.00000 days | Status: CP
Start: 2017-05-16 — End: 2017-06-27

## 2017-05-16 MED ORDER — LIPASE-PROTEASE-AMYLASE 36,000-114,000-180,000 UNIT CAPSULE,DELAY REL: capsule | 0 refills | 0 days | Status: AC

## 2017-05-16 MED FILL — CREON/36000 UNITS/CPEP: CREON/36000 UNITS/CPEP | 14 days supply | Qty: 600 | Fill #0

## 2017-05-16 NOTE — Unmapped (Signed)
Prior authorization for Creon under the Pharmacy Assistance Program (PAP) is approved through 05/16/18.   Please do not hesitate to contact me if you have any questions or concerns.    Sincerely,     Susa Loffler, CPhT - PA Specialist (p) [phone]  [place] Assessment of Medications Program (CAMP)

## 2017-05-16 NOTE — Unmapped (Signed)
Addended by: Alton Revere T on: 05/16/2017 11:07 AM     Modules accepted: Orders

## 2017-05-16 NOTE — Unmapped (Signed)
Sent Creon 14-day supply to [facility] to send out today to patient.  Creon was approved via PAP benefit.    Spoke to patient to confirm delivery address.  [name] stated he did not need anything refilled other than his Creon. I told him the 14-day benefit expired 05/30/17.    [name], PharmD, BCPS, CPP  Clinical Pharmacist Practitioner  [facility] Adult Cystic Fibrosis Clinic / [facility] Bronchiectasis Clinic  Pager: [phone]

## 2017-05-27 NOTE — Unmapped (Signed)
Patient messaged provider over the weekend:     So for the past couple of days, I have been experiencing some bad symptoms and I am reaching to get some advice. The symptoms are short of breath, dizziness, cough that is not productive, chest pains, and I feel like their is more but I can't think of them right now. The advice I need is this, currently I have no insurance and can't go to my primary care so I don't know what to do, if their anything I can try over the counter or anything like that. I have been trying Thera ful and it's has not been helping at all everyday I feel worse so I am stuck please help me. I'm not out of any major meds other than my hypertension meds. Please let me know if there's anything y'all can do or anything I can try that I haven't mentioned.     Bubba Camp called the CF nurse coordinator's feeling very poorly.     Date of symptom onset: started small around last week, Friday and Saturday symptoms started to pick up speed.     Symptoms include:   Pulmonary-    Increased cough yes   Productive cough yes both productive and dry,  It is a little darker in color than normal (dark green)    Hemoptysis  no, not that he can tell    Chest pain  yes left side greater than right side    Chest tightness yes very much so, and short of breath    Fever  no, not that he has documented, but gets really really cold    Chills  yes   Nightsweats yes sweats with fever breaking, night sweats have been off and on    Change in appetite yes, not been the greatest    Other:  nasal congestion, severe pain and headaches    Other:  Energy level could be better     Current measures they are taken:   Able to perform regular AC & breathing treatments: yes   AC: Vest three times a day (20 minutes)   Nebs: Albuterol, hypertonic, Pulmozyme    Requiring additional: yes, using albuterol (Ventolin)  inhaler    Currently on inhaled abx: yes on Amikacin for NTM    Last culture results:   03/29/17:    3+ Stenotrophomonas maltophilia    Specimen Source: Lung, Right Upper Lobe    03/18/17:  4+ Oropharyngeal Flora Isolated   2+ Smooth Pseudomonas aeruginosa    Specimen Source: Sputum    Would like to try orals, though doesn't have insurance currently. Would need to pay out of pocket, discussed possibility of antibiotics on the $4 plans. Otherwise, he asked about coming to [facility]. Informed him [facility] won't turn him away based on lack of insurance and he could apply for [facility].      Dr Koren Shiver routed note and notified of patient call.    [name], RN  CF Nurse Coordinator   [phone]

## 2017-05-27 NOTE — Unmapped (Signed)
Multiple days of dizziness, chest pain, fever, chills, nightsweats, and intermittently productive cough. Out of HTN meds. Concerned re: lack of insurance. Hx of PsA and MABS lung disease with a recent lobectomy 2/2 MABS. Have instructed patient to come to the hospital for admission for exacerbation. Would swab for flu and treat for PsA first (keeping patient on home MABS meds as well), and if patient does not start to improve, would consider CT chest to look for stigmata of active MABS and consider escalation of abx to those that cover MABS.

## 2017-05-30 ENCOUNTER — Inpatient Hospital Stay
Admission: EM | Admit: 2017-05-30 | Discharge: 2017-06-05 | Disposition: A | Discharge status: Home / Self Care | Payer: BC Managed Care – PPO | Source: Intra-hospital

## 2017-05-30 ENCOUNTER — Inpatient Hospital Stay
Admission: EM | Admit: 2017-05-30 | Discharge: 2017-06-05 | Disposition: A | Discharge status: Home / Self Care | Payer: BC Managed Care – PPO | Source: Intra-hospital | Attending: Pulmonary Disease | Admitting: Pulmonary Disease

## 2017-05-30 DIAGNOSIS — R509 Fever, unspecified: Principal | ICD-10-CM

## 2017-05-30 LAB — CBC W/ AUTO DIFF
BASOPHILS ABSOLUTE COUNT: 0.1 10*9/L (ref 0.0–0.1)
HEMATOCRIT: 38.7 % — ABNORMAL LOW (ref 41.0–53.0)
HEMOGLOBIN: 12.3 g/dL — ABNORMAL LOW (ref 13.5–17.5)
LARGE UNSTAINED CELLS: 3 % (ref 0–4)
LYMPHOCYTES ABSOLUTE COUNT: 1.9 10*9/L (ref 1.5–5.0)
MEAN CORPUSCULAR HEMOGLOBIN CONC: 31.6 g/dL (ref 31.0–37.0)
MEAN CORPUSCULAR VOLUME: 77.4 fL — ABNORMAL LOW (ref 80.0–100.0)
MEAN PLATELET VOLUME: 9.1 fL (ref 7.0–10.0)
MONOCYTES ABSOLUTE COUNT: 0.6 10*9/L (ref 0.2–0.8)
PLATELET COUNT: 361 10*9/L (ref 150–440)
RED BLOOD CELL COUNT: 5.01 10*12/L (ref 4.50–5.90)
RED CELL DISTRIBUTION WIDTH: 15.8 % — ABNORMAL HIGH (ref 12.0–15.0)
WBC ADJUSTED: 8.2 10*9/L (ref 4.5–11.0)

## 2017-05-30 LAB — URINALYSIS WITH CULTURE REFLEX
BACTERIA: NONE SEEN /HPF
BLOOD UA: NEGATIVE
GLUCOSE UA: NEGATIVE
KETONES UA: NEGATIVE
LEUKOCYTE ESTERASE UA: NEGATIVE
NITRITE UA: NEGATIVE
PH UA: 5 (ref 5.0–9.0)
RBC UA: 1 /HPF (ref <3)
SPECIFIC GRAVITY UA: 1.015 (ref 1.003–1.030)
SQUAMOUS EPITHELIAL: <1 /HPF (ref 0–5)
UROBILINOGEN UA: 0.2

## 2017-05-30 LAB — BLOOD UA: Lab: NEGATIVE

## 2017-05-30 LAB — COMPREHENSIVE METABOLIC PANEL
ALBUMIN: 4.5 g/dL (ref 3.5–5.0)
ALKALINE PHOSPHATASE: 84 U/L (ref 38–126)
ALT (SGPT): 86 U/L — ABNORMAL HIGH (ref 19–72)
ANION GAP: 16 mmol/L — ABNORMAL HIGH (ref 9–15)
AST (SGOT): 48 U/L (ref 19–55)
BILIRUBIN TOTAL: 0.4 mg/dL (ref 0.0–1.2)
BLOOD UREA NITROGEN: 19 mg/dL (ref 7–21)
BUN / CREAT RATIO: 25
CALCIUM: 9.6 mg/dL (ref 8.5–10.2)
CHLORIDE: 100 mmol/L (ref 98–107)
CO2: 23 mmol/L (ref 22.0–30.0)
EGFR MDRD AF AMER: >=60 mL/min/{1.73_m2} (ref >=60)
EGFR MDRD NON AF AMER: >=60 mL/min/{1.73_m2} (ref >=60)
POTASSIUM: 4.4 mmol/L (ref 3.5–5.0)
PROTEIN TOTAL: 7.7 g/dL (ref 6.5–8.3)
SODIUM: 139 mmol/L (ref 135–145)

## 2017-05-30 LAB — MONOCYTES ABSOLUTE COUNT: Lab: 0.6

## 2017-05-30 LAB — BLOOD GAS, VENOUS
O2 SATURATION VENOUS: 60.4 % (ref 40.0–85.0)
PCO2 VENOUS: 43 mmHg (ref 40–60)
PH VENOUS: 7.39 (ref 7.32–7.43)

## 2017-05-30 LAB — SODIUM: Sodium:SCnc:Pt:Ser/Plas:Qn:: 139

## 2017-05-30 LAB — PHOSPHORUS: Phosphate:MCnc:Pt:Ser/Plas:Qn:: 4.6

## 2017-05-30 LAB — MAGNESIUM: Magnesium:MCnc:Pt:Ser/Plas:Qn:: 1.8

## 2017-05-30 LAB — PH VENOUS: pH:LsCnc:Pt:BldV:Qn:: 7.39

## 2017-05-30 LAB — SMEAR REVIEW

## 2017-05-30 LAB — LIPASE: Triacylglycerol lipase:CCnc:Pt:Ser/Plas:Qn:: 15 — ABNORMAL LOW

## 2017-05-30 NOTE — Unmapped (Signed)
Patient rounds completed. The following patient needs were addressed:  Pain, Toileting, Personal Belongings, Plan of Care, Call Bell in Reach and Bed Position Low.  Patient resting in stretcher in no acute distress, respirations even and unlabored.  All needs and concerns managed, stretcher in low locked position, call bell in reach.  Will continue to monitor.

## 2017-05-30 NOTE — Unmapped (Signed)
Emergency Department Provider Note  [facility] Emergency Department      MDM & Course     Christian Young is a 31 y.o. male with a PMH of CF followed by [facility] Pulmonology and requiring multiple admissions for exacerbation, DVT and hypertension presenting to the ED for the evaluation of worsening shortness of breath x4 days. He endorses productive cough with associated chest pain and right side pain, diaphoresis, and fever with Tmax 101F. Patient has been fully compliant w/ course of DOAC. Denies any new calf swelling or pain. Denies Pleuritic type CP. On exam, patient has good air movement with intermittent crackles and wheezes bilaterally. No calf swelling or tenderness.    Plan  -Basic labs, UA with cultures, lipase, VBG, mag, phos, rapid flu, and respiratory panel  -CXR and EKG  -Consult pulmonary for likely admission    Pending admission at end of my shift.       Clinical Impression & Additional MDM     Final diagnoses:   Cystic fibrosis with pulmonary exacerbation (CMS-HCC) (Primary)   Fever, unspecified fever cause   Shortness of breath       I have reviewed the vital signs and the triage note. Labs and radiology results that were available during my care of the patient were independently reviewed by me and considered in my medical decision making. Per chart review, patient has had multiple admissions for exacerbations and IV antibiotics within the past year.     I directly visualized and independently interpreted the EKG  I independently visualized the radiology images.   I reviewed the patient's prior medical records  I discussed the case with the MAO pending admission    History & ROS   Christian Young is a 31 y.o. male with a PMH of CF followed by [facility] Pulmonology, DVT and hypertension presenting to the ED for the evaluation of worsening shortness of breath x4 days. Patient is s/p right upper lobectomy on 03/29/17. Patient reports 4 days of worsening productive cough, nasal congestion, diaphoresis and fever with Tmax 101F. He also endorses chest and right side pain 2/2 cough. He states he has been admitted to hospital at least once per month for the past year due to CF exacerbations. or his pain. Denies abdominal pain, nausea, vomiting, calf pain, or any other concerning symptoms. Denies history of cardiac problems. He last took Tylenol over four hours ago. Patient has been unable to refill his rx for azithromycin, tramadol or baclofin for the past month as he is in the process of regaining his insurance.     CONST: See above  EYES: Negative for visual changes.  ENT: Negative for sore throat.  CARD: Positive for chest pain.  RESP: Positive for shortness of breath and productive cough.  GI: Negative for abdominal pain, vomiting or diarrhea.  GU: Negative for dysuria.  MSK: Negative for extremity pain  SKIN: Negative for rash.  NEURO: Negative for headaches, focal weakness or numbness.    Physical Exam   BP 150/99  - Pulse 100  - Temp 37.1 ??C (98.8 ??F) (Skin)  - Resp 14  - SpO2 98%   CONST:  Alert and oriented. Well appearing and in no distress.  EYES: Normal conjunctiva, PERRL  ENT: Mucous membranes are moist. Clear pharynx.  HEME/LYMPH: No pallor  CV: S1S2 RRR. 2+ BL symmetric radial pulses  RESP:  Normal RR. Breathing comfortably. Good air movement, intermittent bilateral crackles and wheezes  GI: Soft, non-tender, non-distended  MSK: No chest wall tenderness. Nl ROM, no ext tenderness. No B/L LE erythema or edema.  NEURO: Normal speech and language. No gross focal neurologic deficits are appreciated.  SKIN: Skin is warm, dry and intact. No rash noted.    Additional History     Past Medical History:   Diagnosis Date   ??? Anxiety    ??? Chronic pain disorder    ??? Cystic fibrosis (CMS-HCC)    ??? Depression    ??? Hypertension      Past Surgical History:   Procedure Laterality Date   ??? PR REMOVAL OF LUNG,LOBECTOMY Right 03/29/2017    Procedure: REMOVAL OF LUNG, OTHER THAN PNEUMONECTOMY; SINGLE LOBE (LOBECTOMY);  Surgeon: Cherie Dark, MD;  Location: MAIN OR [facility];  Service: Thoracic     Family History   Problem Relation Age of Onset   ??? Bipolar disorder Mother    ??? Depression Mother      Allergies  C[place] [aztreonam lysine]; Cefepime; and Tobramycin  Social History  Social History   Substance Use Topics   ??? Smoking status: Never Smoker   ??? Smokeless tobacco: Never Used   ??? Alcohol use No       Attestation   Documentation assistance was provided by Shelby Dubin, Scribe, on May 30, 2017 at 4:13 PM for Sherlene Shams, MD.    Documentation assistance was provided by the scribe in my presence.  The documentation recorded by the scribe has been reviewed by me and accurately reflects the services I personally performed.             Laurance Flatten, MD  05/30/17 [phone]

## 2017-05-30 NOTE — Unmapped (Signed)
PT reports CF exacerbation that began approximately a week and a half ago-reports several people in his house being sick. Endorses cough, fever with tmax of 101, chills, night sweats, and chest pain.

## 2017-05-31 LAB — BASIC METABOLIC PANEL
ANION GAP: 10 mmol/L (ref 9–15)
BLOOD UREA NITROGEN: 20 mg/dL (ref 7–21)
BUN / CREAT RATIO: 22
CALCIUM: 8.3 mg/dL — ABNORMAL LOW (ref 8.5–10.2)
CHLORIDE: 102 mmol/L (ref 98–107)
CO2: 26 mmol/L (ref 22.0–30.0)
CREATININE: 0.9 mg/dL (ref 0.70–1.30)
EGFR MDRD NON AF AMER: >=60 mL/min/{1.73_m2} (ref >=60)
GLUCOSE RANDOM: 113 mg/dL (ref 65–179)
SODIUM: 138 mmol/L (ref 135–145)

## 2017-05-31 LAB — CBC
HEMATOCRIT: 34.9 % — ABNORMAL LOW (ref 41.0–53.0)
MEAN CORPUSCULAR HEMOGLOBIN: 24.4 pg — ABNORMAL LOW (ref 26.0–34.0)
MEAN CORPUSCULAR VOLUME: 77.4 fL — ABNORMAL LOW (ref 80.0–100.0)
MEAN PLATELET VOLUME: 8.5 fL (ref 7.0–10.0)
PLATELET COUNT: 313 10*9/L (ref 150–440)
RED BLOOD CELL COUNT: 4.51 10*12/L (ref 4.50–5.90)
RED CELL DISTRIBUTION WIDTH: 15.7 % — ABNORMAL HIGH (ref 12.0–15.0)
WBC ADJUSTED: 6 10*9/L (ref 4.5–11.0)

## 2017-05-31 LAB — BUN / CREAT RATIO: Urea nitrogen/Creatinine:MRto:Pt:Ser/Plas:Qn:: 22

## 2017-05-31 LAB — MEAN CORPUSCULAR VOLUME: Lab: 77.4 — ABNORMAL LOW

## 2017-05-31 NOTE — Unmapped (Addendum)
Patient rounds completed. The following patient needs were addressed:  Pain, Personal Belongings, Plan of Care, Call Bell in Reach and Bed Position Low . Pain medication given as requested from pt. Pt resting on stretcher in NAD. Meal tray requested from dietary. Will continue to monitor.

## 2017-05-31 NOTE — Unmapped (Signed)
Med G Daily Progress Note    Assessment/Plan:    Principal Problem:    Cystic fibrosis with pulmonary exacerbation (CMS-HCC)  Active Problems:    Essential hypertension    History of DVT (deep vein thrombosis)    Depressive disorder, r/o major depression, recurrent, moderate/severe and dysthymic disorder    Chronic pansinusitis    Pancreatic insufficiency due to cystic fibrosis (CMS-HCC)  Resolved Problems:    * No resolved hospital problems. *        Pressure Ulcer(s)    Active Pressure Ulcer     None                 Christian Young is a 32 y.o. male who presented to [facility] with Cystic fibrosis with pulmonary exacerbation (CMS-HCC).    Cystic fibrosis exacerbation with pulmonary manifestations: History sounds like viral infection including sick contacts, fever, chills, myalgias. Will treat for PsA given previous cultures, continue home MABS treatment.  - rapid flu/RSV negative  - blood cultures, CF sputum culture pending  - AFB culture  - culture history:              - 02/2017: M abscessus (S: amikacin, clarithro, I: linezolid, imipenem, cefoxitin)              - 03/2017: stenotrophomonas (S: levoflox, minocycline, bactrim)              - 03/2017: smooth pseudomonas (S: cipro, levoflox, tobramycin, I: Zosyn, aztreonam)  - antibiotics this admission:                          - for pseudomonas: start Zosyn IV, tobramycin IV              - for steno: home regimen with azithromycin, clofazimine but hold inhaled amikacin given IV tobra  - may need repeat chest CT if not improving on abx above to evaluate for worsening M abscessus   - airway clearance: hypertonic nebs 7%, albuterol QID w/ prn, Pulmozyme  - mechanical airway clearance per RT  - spirometry  ??  HTN: has been out of meds for ~ 1 months  - continue home lisinopril, Coreg  ??  CF related pancreatic insufficiency:  - continue Creon (substituting 24,000 unit for home 36,000 unit while inpatient)  ??  Chronic sinusitis:  - continue home flonase, zyrtec, singulair ??  Hx DVT:   - continue home rivaroxaban  ??  Restless leg syndrome:  - continue home mirapax  ??  Depression, hx SI:  - continue home lamictal, vilazodone    FEN/GI:  - regular diet    DVT ppx: rivaroxaban     Code status: Full code  ___________________________________________________________________    Subjective:  No acute events overnight, feeling about the same this morning. Has some chest pain from coughing, gets this with all his exacerbations. Stable on room air.    Recent Results (from the past 24 hour(s))   ECG 12 lead (Adult)    Collection Time: 05/30/17  4:01 PM   Result Value Ref Range    EKG Systolic BP  mmHg    EKG Diastolic BP  mmHg    EKG Ventricular Rate 102 BPM    EKG Atrial Rate 102 BPM    EKG P-R Interval 152 ms    EKG QRS Duration 90 ms    EKG Q-T Interval 330 ms    EKG QTC Calculation 430 ms  EKG Calculated P Axis 40 degrees    EKG Calculated R Axis 35 degrees    EKG Calculated T Axis 45 degrees   Urinalysis with Culture Reflex    Collection Time: 05/30/17  5:21 PM   Result Value Ref Range    Color, UA Yellow     Clarity, UA Clear     Specific Gravity, UA 1.015 1.003 - 1.030    pH, UA 5.0 5.0 - 9.0    Leukocyte Esterase, UA Negative Negative    Nitrite, UA Negative Negative    Protein, UA Negative Negative    Glucose, UA Negative Negative    Ketones, UA Negative Negative    Urobilinogen, UA 0.2 mg/dL 0.2 mg/dL, 1.0 mg/dL    Bilirubin, UA Negative Negative    Blood, UA Negative Negative    RBC, UA 1 <3 /HPF    WBC, UA <1 <2 /HPF    Squam Epithel, UA <1 0 - 5 /HPF    Bacteria, UA None Seen None Seen /HPF    Mucus, UA Rare (A) None Seen /HPF   Comprehensive Metabolic Panel    Collection Time: 05/30/17  5:47 PM   Result Value Ref Range    Sodium 139 135 - 145 mmol/L    Potassium 4.4 3.5 - 5.0 mmol/L    Chloride 100 98 - 107 mmol/L    CO2 23.0 22.0 - 30.0 mmol/L    BUN 19 7 - 21 mg/dL    Creatinine 5.40 9.81 - 1.30 mg/dL    BUN/Creatinine Ratio 25     EGFR MDRD Non Af Amer >=60 >=60 mL/min/1.87m2 EGFR MDRD Af Amer >=60 >=60 mL/min/1.37m2    Anion Gap 16 (H) 9 - 15 mmol/L    Glucose 150 65 - 179 mg/dL    Calcium 9.6 8.5 - 19.1 mg/dL    Albumin 4.5 3.5 - 5.0 g/dL    Total Protein 7.7 6.5 - 8.3 g/dL    Total Bilirubin 0.4 0.0 - 1.2 mg/dL    AST 48 19 - 55 U/L    ALT 86 (H) 19 - 72 U/L    Alkaline Phosphatase 84 38 - 126 U/L   Magnesium Level    Collection Time: 05/30/17  5:47 PM   Result Value Ref Range    Magnesium 1.8 1.6 - 2.2 mg/dL   Phosphorus Level    Collection Time: 05/30/17  5:47 PM   Result Value Ref Range    Phosphorus 4.6 2.9 - 4.7 mg/dL   Lipase Level    Collection Time: 05/30/17  5:47 PM   Result Value Ref Range    Lipase 15 (L) 44 - 232 U/L   CBC w/ Differential    Collection Time: 05/30/17  5:47 PM   Result Value Ref Range    WBC 8.2 4.5 - 11.0 10*9/L    RBC 5.01 4.50 - 5.90 10*12/L    HGB 12.3 (L) 13.5 - 17.5 g/dL    HCT 47.8 (L) 29.5 - 53.0 %    MCV 77.4 (L) 80.0 - 100.0 fL    MCH 24.5 (L) 26.0 - 34.0 pg    MCHC 31.6 31.0 - 37.0 g/dL    RDW 62.1 (H) 30.8 - 15.0 %    MPV 9.1 7.0 - 10.0 fL    Platelet 361 150 - 440 10*9/L    Variable HGB Concentration Slight (A) Not Present    Absolute Neutrophils 5.2 2.0 - 7.5 10*9/L    Absolute Lymphocytes 1.9 1.5 -  5.0 10*9/L    Absolute Monocytes 0.6 0.2 - 0.8 10*9/L    Absolute Eosinophils 0.2 0.0 - 0.4 10*9/L    Absolute Basophils 0.1 0.0 - 0.1 10*9/L    Large Unstained Cells 3 0 - 4 %    Microcytosis Moderate (A) Not Present    Hypochromasia Marked (A) Not Present   Blood Gas, Venous    Collection Time: 05/30/17  5:47 PM   Result Value Ref Range    Specimen Source Venous     FIO2 Venous Room Air     pH, Venous 7.39 7.32 - 7.43    pCO2, Ven 43 40 - 60 mm Hg    pO2, Ven 35 30 - 55 mm Hg    HCO3, Ven 26 22 - 27 mmol/L    Base Excess, Ven 1.1 -2.0 - 2.0    O2 Saturation, Venous 60.4 40.0 - 85.0 %   Morphology Review    Collection Time: 05/30/17  5:47 PM   Result Value Ref Range    Smear Review Comments See Comment (A) Undefined    Polychromasia Slight (A) Not Present   Rapid Influenza (Flu A/B)    Collection Time: 05/30/17  6:12 PM   Result Value Ref Range    Influenza A Negative Negative    Influenza B Negative Negative   Basic metabolic panel    Collection Time: 05/31/17  4:51 AM   Result Value Ref Range    Sodium 138 135 - 145 mmol/L    Potassium 4.2 3.5 - 5.0 mmol/L    Chloride 102 98 - 107 mmol/L    CO2 26.0 22.0 - 30.0 mmol/L    BUN 20 7 - 21 mg/dL    Creatinine 7.25 3.66 - 1.30 mg/dL    BUN/Creatinine Ratio 22     EGFR MDRD Non Af Amer >=60 >=60 mL/min/1.2m2    EGFR MDRD Af Amer >=60 >=60 mL/min/1.77m2    Anion Gap 10 9 - 15 mmol/L    Glucose 113 65 - 179 mg/dL    Calcium 8.3 (L) 8.5 - 10.2 mg/dL   CBC    Collection Time: 05/31/17  4:51 AM   Result Value Ref Range    WBC 6.0 4.5 - 11.0 10*9/L    RBC 4.51 4.50 - 5.90 10*12/L    HGB 11.0 (L) 13.5 - 17.5 g/dL    HCT 44.0 (L) 34.7 - 53.0 %    MCV 77.4 (L) 80.0 - 100.0 fL    MCH 24.4 (L) 26.0 - 34.0 pg    MCHC 31.5 31.0 - 37.0 g/dL    RDW 42.5 (H) 95.6 - 15.0 %    MPV 8.5 7.0 - 10.0 fL    Platelet 313 150 - 440 10*9/L     Labs/Studies:  Labs and Studies from the last 24hrs per EMR and Reviewed    Objective:  Temp:  [36.8 ??C-37.7 ??C] 37.7 ??C  Heart Rate:  [56-122] 122  Resp:  [16-18] 18  BP: (111-148)/(58-92) 138/87  SpO2:  [94 %-99 %] 94 %    Gen: well appearing young man, no acute distress, sitting upright on side of bed, breathing comfortably  HEENT: MMM  CV: RRR, normal S1, S2, no murmur, rubs, gallops  Pulm: lungs clear bilaterally, normal work of breathing, no wheezes, rhonchi or crackles  Extr: warm and well perfused  Neuro: moving all extremities, answering questions appropriately, speech fluent, facial expression symmetric  Skin: no rashes noted

## 2017-05-31 NOTE — Unmapped (Signed)
Pt relates fevers up to 101, green sputum, cough, and chest discomfort x4+ days.  Wife states wasn't feeling well approx 1 week ago.  Wife states household has had URI sx and passing bug around for the past couple weeks.  Pt speaking in full sentences, NAD noted.  Wife states today pt temp 99.  Last dose of Tylenol @ noon.  Pt rates chest pain 7/10 currently.  States pain 9/10 at worst, 4/10 after earlier pain meds.  Br So clr bil ant and post.  Pt states he normally gets admitted for current symptoms.    Pt asking if morphine order was recurring and would like another dose of pain meds.

## 2017-05-31 NOTE — Unmapped (Signed)
Patient rounding complete, call bell in reach, bed locked and in lowest position, patient belongings at bedside and within reach of patient. Breakfast tray provided

## 2017-05-31 NOTE — Unmapped (Signed)
Patient states pain in chest and back that patient states is consistent with his CF. Provider paged per patients request for medication for the pain.

## 2017-05-31 NOTE — Unmapped (Signed)
Patient rounds completed. The following patient needs were addressed:  Personal Belongings, Plan of Care, Call Bell in Reach and Bed Position Low . Meal tray delivered from dietary. Pt sitting in chair in NAD, talking on phone.

## 2017-05-31 NOTE — Unmapped (Signed)
Pharmacy Note: Tobramycin Dosin/Monitoring   Christian Young 31 y.o. 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    ABX   Amikacin (inhaled) stopped may resume during hospital stay    Tobramycin q24h (1h infusion)  Goal peak at END of infusion ~ 12-14, mcg/mL *  * could challenge higher goal peaks  (ie 18-82mcg/mL) if tolerates (hx tinnitis + hearing loss) long hx of IV amikacin  12h random < 15mcg/mL  Trough < 0.77mcg/mL, with ~ 8h drug free interval      Patient parameters at start of therapy: 05/31/17  HT: 6'   WT: 118Ikg  (IBW 75kg, Adjusted BW 92kg)  Bun/scr:      Previous regimen started  Tobra 460mg  q24h  (1h infusio  Levels ordered for 10/21   > doses to be adjusted based on those values  Continue to monitor bun/scr, K/Mg at least 3-4 x/ week.      Pharmacy to follow along with medical staff    Achille Rich, PharmD  [facility] Team Pharmacist

## 2017-05-31 NOTE — Unmapped (Signed)
Pillow provided for comfort.  Pt denies further needs.  Pt aware cont wtg assigned room.  No change pt status.  Resp remain easy et nonlabored.

## 2017-05-31 NOTE — Unmapped (Signed)
Patient rounds completed. The following patient needs were addressed:  Call Bell in Reach and Bed Position Low . Patient asleep in bed resp even and unlabored.

## 2017-05-31 NOTE — Unmapped (Signed)
Patient transported to X-ray  Transported by Radiology  How tranported Wheelchair  Cardiac Monitor no

## 2017-05-31 NOTE — Unmapped (Addendum)
Cont wtg assigned bed.  Pt cont resting quietly on stretcher, low fowlers; lights off for comfort, watching TV.    No change status.  Resp cont easy et nonlabored.  NAD cont.   Pt requesting more pain meds.  States pain level is increasing.

## 2017-05-31 NOTE — Unmapped (Signed)
Admit team has been in to see pt.  Pt aware wtg assigned bed.  Denies needs or further c/o at this time.  No change pt status.

## 2017-05-31 NOTE — Unmapped (Signed)
Patient rounds completed. The following patient needs were addressed:  Pain, Toileting, Personal Belongings, Plan of Care, Call Bell in Reach and Bed Position Low . Pt self positions. NAD.

## 2017-05-31 NOTE — Unmapped (Signed)
Patient rounds completed. The following patient needs were addressed:  Personal Belongings, Plan of Care, Call Bell in Reach and Bed Position Low .

## 2017-05-31 NOTE — Unmapped (Signed)
Patient returned from X-ray  Transported by Radiology  How tranported Stretcher  Cardiac Monitor no

## 2017-05-31 NOTE — Unmapped (Signed)
Breakfast ordered 

## 2017-05-31 NOTE — Unmapped (Signed)
Aminoglycoside Initiation Pharmacy Note    Christian Young is a 31 y.o. male being initiated on tobramycin for pneumonia and CF exacerbation.    Goal peak: gentamicin/tobramycin 10-14 mg/L  Goal trough: gentamicin/tobramycin <1    Pharmacokinetic Parameters:    Wt Readings from Last 1 Encounters:   05/30/17 (!) 118 kg (260 lb 2.3 oz)     Ideal body weight: 75 kg, Adjusted body weight: 92 kg    Lab Results   Component Value Date    CREATININE 0.77 05/30/2017       Vd = 30.4 L, ke = 0.36 hr-1    Recommended Dose: 460 mg IV q24    Restart  Of previous recorded dose    Estimated Peak: 13.9 mg/L  Estimated trough: 0 mg/L    Pharmacy will continue to monitor and order levels as appropriate.  Patient will be followed for changes in renal function, toxicity, and efficacy.  Please page service pharmacist with questions/clarifications.    Denman George,  PharmD

## 2017-05-31 NOTE — Unmapped (Signed)
All patient specific medications received from off going nurse placed in pt specific bin in pyxis.

## 2017-05-31 NOTE — Unmapped (Signed)
Report and primary care to [facility] RN.

## 2017-05-31 NOTE — Unmapped (Signed)
Report given to [facility], Charity fundraiser. Patient care transferred at this time.

## 2017-05-31 NOTE — Unmapped (Signed)
Report and primary care received on pt.  Wtg dispo.  Wife at bedside.  Pt lying mid fowlers.

## 2017-05-31 NOTE — Unmapped (Signed)
Patient rounds completed. The following patient needs were addressed:  Pain, Call Bell in Reach, Bed Position Low and any further requests .

## 2017-05-31 NOTE — Unmapped (Signed)
General Medicine History and Physical    Assessment/Plan:    Principal Problem:    Cystic fibrosis with pulmonary exacerbation (CMS-HCC)  Active Problems:    Essential hypertension    History of DVT (deep vein thrombosis)    Depressive disorder, r/o major depression, recurrent, moderate/severe and dysthymic disorder    Chronic pansinusitis    Pancreatic insufficiency due to cystic fibrosis (CMS-HCC)      Christian Young is a 31 y.o. male with PMHx as reviewed in the EMR that presented to [facility] with Cystic fibrosis with pulmonary exacerbation (CMS-HCC). Overall stable and breathing comfortably on room air.    Cystic fibrosis exacerbation with pulmonary manifestations: History sounds like viral infection including sick contacts, fever, chills, myalgias. Will treat for PsA given previous cultures, continue home MABS treatment.  - rapid flu/RSV  - blood cultures  - CF sputum culture  - AFB culture  - culture history:   - 02/2017: M abscessus (S: amikacin, clarithro, I: linezolid, imipenem, cefoxitin)   - 03/2017: stenotrophomonas (S: levoflox, minocycline, bactrim)   - 03/2017: smooth pseudomonas (S: cipro, levoflox, tobramycin, I: Zosyn, aztreonam)  - antibiotics this admission:    - for pseudomonas: start Zosyn IV, tobramycin IV   - for steno: home regimen with azithromycin, clofazimine but hold inhaled amikacin given IV tobra  - may need repeat chest CT if not improving on abx above to evaluate for worsening M abscessus   - airway clearance: hypertonic nebs 7%, albuterol BID w/ q6 prn, Pulmozyme  - mechanical airway clearance per RT  - spirometry    HTN: has been out of meds for ~ 1 months  - continue home lisinopril, Coreg    CF related pancreatic insufficiency:  - continue Creon    Chronic sinusitis:  - continue home flonase, zyrtec, singulair    Hx DVT:   - continue home rivaroxaban    Restless leg syndrome:  - continue home mirapax    Depression, hx SI:  - continue home lamictal, vilazodone ___________________________________________________________________    Chief Complaint:  Chief Complaint   Patient presents with   ??? Shortness of Breath     Cystic fibrosis with pulmonary exacerbation (CMS-HCC)    HPI:  Christian Young is a 31 y.o. male with history of cystic fibrosis, hypertension, DVT that presented to [facility] with Cystic fibrosis with pulmonary exacerbation (CMS-HCC).    Patient reports last week his parents, whom he lives with, developed upper respiratory symptoms of cough, congestion. He told them to go to the doctor but they did not. He then developed similar symptoms about one month ago. Had productive cough, fevers, chills. No hemoptysis. Last fever ~ 36 hours ago up to 101F. No diarrhea or vomiting.     He reached out to his pulmonologist who recommended admission for antibiotics. He reports he has not been able to get his azithromycin for > 1 month. He also has been off his blood pressure meds for that amount of time.     He has a history of M abscessus, stenotrophomonas, smooth PsA. He has had ~ 8 admission in the last one year for his CF. Recently moved from [facility] and established care here earlier this year. He did require right upper lobectomy during his last hospitalization 03/29/17 for significant scarring and volume loss.     Allergies:  Cayston [aztreonam lysine]; Cefepime; and Tobramycin    Medications:   Prior to Admission medications    Medication Dose, Route, Frequency   albuterol (  PROVENTIL HFA;VENTOLIN HFA) 90 mcg/actuation inhaler 2 puffs, Inhalation, Every 6 hours PRN   amikacin (AMIKIN) 500 mg/2 mL injection Withdraw 2 mL (500mg ) and add to neb cup. Administer using nebulizer BID. Start after IV amikacin is finished   azithromycin (ZITHROMAX) 500 MG tablet 500 mg, Oral, Daily (standard)   budesonide-formoterol (SYMBICORT) 160-4.5 mcg/actuation inhaler 2 puffs, Inhalation, 2 times a day (standard)   carvedilol (COREG) 3.125 MG tablet 3.125 mg, Oral, 2 times a day (standard) cetirizine (ZYRTEC) 10 MG tablet 10 mg, Oral, Daily (standard)   cholecalciferol, vitamin D3, 1,000 unit tablet 1,000 Units, Oral, Daily (standard)   CLOFAZIMINE ORAL 100 mg, Oral, Daily (standard)   dornase alfa (PULMOZYME) 1 mg/mL nebulizer solution 2.5 mg, Inhalation, Daily (standard)   fluticasone (FLONASE) 50 mcg/actuation nasal spray 2 sprays, Each Nare, Daily (standard)   lamoTRIgine (LAMICTAL) 25 MG tablet 25 mg, Oral, 2 times a day (standard), Then increase to BID after 1 week.    lipase-protease-amylase (CREON) 36,000-114,000- 180,000 unit CpDR Take 7-8 capsules with meals and 5-6 capsules with snacks   lisinopril (PRINIVIL,ZESTRIL) 10 MG tablet 10 mg, Oral, Daily (standard)   magnesium oxide (MAG-OX) 400 mg tablet 800 mg, Oral, 2 times a day   melatonin 3 mg Tab 6 mg, Oral, Nightly   montelukast (SINGULAIR) 10 mg tablet 10 mg, Oral, Nightly   MVW Complete, pediatric multivit 61-D3-vit K, 1,500-800 unit-mcg cap 1 capsule, Oral, 2 times a day (standard)   omeprazole (PRILOSEC) 20 MG capsule 20 mg, Oral, Daily (standard)   PARI LC D NEBULIZER Misc Provide 1 device  for use with inhaled medication.   pramipexole (MIRAPEX) 0.125 MG tablet 0.25 mg, Oral, Daily (standard)   pregabalin (LYRICA) 100 MG capsule 200 mg, Oral, 2 times a day (standard)   rivaroxaban (XARELTO) 20 mg tablet 20 mg, Oral, Daily   sodium bicarb-sodium chloride 700-2,300 mg Pack Irrigate each nostril with 120 mL of solution (1 packet mixed with 240 mL of distilled water) BID    sodium chloride (NS) Soln Add 1mL NaCl into neb cup along with amikacin.   sodium chloride 7% 7 % Nebu 4 mL, Nebulization, 2 times a day (standard)   syringe with needle (SYRINGE 3CC/20GX1) 3 mL 20 gauge x 1 Syrg For use with inhaled antibiotic   tezacaftor 100mg /ivacaftor 150mg  and ivacaftor 150mg  (SYMDEKO) tablets Take 1 tablet (tezacaftor 100mg /ivacaftor 150mg ) by mouth every AM and 1 tablet (ivacaftor 150mg ) every PM with fatty food   vilazodone HCl (VIIBRYD ORAL) 20 mg, Oral, Daily (standard), Titrating 10mg  daily x 1 week.       Medical History:  Past Medical History:   Diagnosis Date   ??? Anxiety    ??? Chronic pain disorder    ??? Cystic fibrosis (CMS-HCC)    ??? Depression    ??? Hypertension        Surgical History:  Past Surgical History:   Procedure Laterality Date   ??? PR REMOVAL OF LUNG,LOBECTOMY Right 03/29/2017    Procedure: REMOVAL OF LUNG, OTHER THAN PNEUMONECTOMY; SINGLE LOBE (LOBECTOMY);  Surgeon: Cherie Dark, MD;  Location: MAIN [facility];  Service: Thoracic       Social History:  Social History     Social History   ??? Marital status: Married     Spouse name: N/A   ??? Number of children: N/A   ??? Years of education: N/A     Occupational History   ??? Not on file.     Social History  Main Topics   ??? Smoking status: Never Smoker   ??? Smokeless tobacco: Never Used   ??? Alcohol use No   ??? Drug use: No   ??? Sexual activity: Yes     Partners: Female     Birth control/ protection: Condom     Other Topics Concern   ??? Not on file     Social History Narrative   ??? No narrative on file       Family History:  Family History   Problem Relation Age of Onset   ??? Bipolar disorder Mother    ??? Depression Mother        Review of Systems:  10 systems reviewed and are negative unless otherwise mentioned in HPI    Labs/Studies:  Labs and Studies from the last 24hrs per EMR and Reviewed    Physical Exam:  Temp:  [37.1 ??C] 37.1 ??C  Heart Rate:  [100] 100  Resp:  [14] 14  BP: (150)/(99) 150/99  SpO2:  [98 %] 98 %    Gen: slightly uncomfortable appearing male sitting upright in bed, in no respiratory distress  HEENT: MMM, nasal drainage present with erythematous nasal folds, oropharynx clear, no LAD  CV: slightly tachycardic, regular, normal S1, S2, no murmur. Line in place right upper cehst.  Pulm: normal work of breathing, good inspiratory effort, lungs clear bilaterally without wheezing, rhonchi or crackles  Abd: soft, nontender, nondistended, no HSM  Extr: warm and well perfused, no tenderness or erythema  Skin: no rashes  Neuro: awake and alert, appropriately conversational, strength 5/5 bilateral LE, speech fluent, facial expressions symmetric

## 2017-05-31 NOTE — Unmapped (Signed)
Progress Note    5:02 PM Care received at signout from Dr. Magdalene Molly    Briefly, Christian Young is a 31 y.o. male with a PMH of CF (followed by [facility]monology), DVT, and HTN who presents to the ED for progressively worsening shortness of breath and chest pain x4 days with associated diaphoresis and fever Tmax 101F. Awaiting labs and CXR. Anticipate admission under pulmonology given hx of multiple admissions for CF exacerbation.     6:09 PM Will admit patient under Pulmonology for CF exacerbation.        Documentation assistance was provided by Christian Young, Scribe, on May 30, 2017 at 5:36 PM for Christian Kelch, MD.     Documentation assistance was provided by the scribe in my presence.  The documentation recorded by the scribe has been reviewed by me and accurately reflects the services I personally performed.

## 2017-05-31 NOTE — Unmapped (Signed)
Report given to summer rn. Patient care transferred at this time.

## 2017-05-31 NOTE — Unmapped (Signed)
Reassessment: patient alert and oriented resp even and unlabored. Patient ambulated to bathroom denies needs at this time.

## 2017-05-31 NOTE — Unmapped (Signed)
Attempted report x1. Floor asked me to call back in 10 mins

## 2017-05-31 NOTE — Unmapped (Signed)
Patient rounds completed. The following patient needs were addressed:  Pain, Toileting, Positioning;   SUPINE, Personal Belongings, Plan of Care, Call Bell in Reach and Bed Position Low . Pt appears to be sleeping, arousable to touch. NAD

## 2017-05-31 NOTE — Unmapped (Signed)
Patient rounds completed. The following patient needs were addressed:  Pain, Personal Belongings, Plan of Care, Call Bell in Reach and Bed Position Low . Report received from Summer. Introductions to pt at this time. Pt resting on stretcher in NAD. Endorses CP. Per MAR, prn tylenol was administered at 0759. NS continues to infuse. Admit team is at the bedside. Will continue to monitor.

## 2017-05-31 NOTE — Unmapped (Signed)
Patient rounds completed. The following patient needs were addressed:  Pain, Personal Belongings, Plan of Care, Call Bell in Reach and Bed Position Low . Pt asking for pain medications. MAR reviewed, nothing besides tylenol is ordered. Admit team paged. Pt otherwise has no unmet needs. Will continue to monitor.

## 2017-05-31 NOTE — Unmapped (Signed)
Patient rounds completed. The following patient needs were addressed:  Pain, Positioning;   SUPINE, Call Bell in Reach and Bed Position Low .

## 2017-05-31 NOTE — Unmapped (Signed)
Pt requested px med. Paged sent to admitting team. Awaiting order.

## 2017-06-01 LAB — TOBRAMYCIN RANDOM: Tobramycin:MCnc:Pt:Ser/Plas:Qn:: 3.1

## 2017-06-01 NOTE — Unmapped (Signed)
Med G Daily Progress Note    Assessment/Plan:    Principal Problem:    Cystic fibrosis with pulmonary exacerbation (CMS-HCC)  Active Problems:    Essential hypertension    History of DVT (deep vein thrombosis)    Depressive disorder, r/o major depression, recurrent, moderate/severe and dysthymic disorder    Chronic pansinusitis    Pancreatic insufficiency due to cystic fibrosis (CMS-HCC)  Resolved Problems:    * No resolved hospital problems. *        Pressure Ulcer(s)    Active Pressure Ulcer     None                 Christian Young is a 31 y.o. male who presented to [facility] with Cystic fibrosis with pulmonary exacerbation (CMS-HCC).    Cystic fibrosis exacerbation with pulmonary manifestations: History sounds like viral infection including sick contacts, fever, chills, myalgias. Will treat for PsA given previous cultures, continue home MABS treatment.  - rapid flu/RSV negative  - blood cultures, CF sputum culture pending  - AFB culture  - culture history:              - 02/2017: M abscessus (S: amikacin, clarithro, I: linezolid, imipenem, cefoxitin)              - 03/2017: stenotrophomonas (S: levoflox, minocycline, bactrim)              - 03/2017: smooth pseudomonas (S: cipro, levoflox, tobramycin, I: Zosyn, aztreonam)  - antibiotics this admission:                          - for pseudomonas: Zosyn, tobramycin IV              - for steno: home regimen with azithromycin, clofazimine but hold inhaled amikacin given IV tobra  - patient still without home clofazimine and Symdeco until Tuesday when someone can bring them to him  - may need repeat chest CT if not improving on abx above to evaluate for worsening M abscessus   - airway clearance: hypertonic nebs 7% QID, albuterol QID w/ prn, Pulmozyme  - mechanical airway clearance per RT  - spirometry  ??  HTN: has been out of meds for ~ 1 month  - continue home lisinopril, Coreg  ??  CF related pancreatic insufficiency:  - continue Creon (substituting 24,000 unit for home 36,000 unit while inpatient)  ??  Chronic sinusitis:  - continue home flonase, zyrtec, singulair  ??  Hx DVT:   - continue home rivaroxaban  ??  Restless leg syndrome:  - continue home mirapax  ??  Depression, hx SI:  - continue home lamictal, vilazodone    FEN/GI:  - regular diet    DVT ppx: rivaroxaban     Code status: Full code  ___________________________________________________________________    Subjective:  No acute events overnight. Says he feels maybe a small amount better. Still with runny nose, watery eyes. Breathing comfortable.    Recent Results (from the past 24 hour(s))   Tobramycin Level, Random    Collection Time: 06/01/17 12:27 PM   Result Value Ref Range    Tobramycin Rm 3.1 Undefined ug/mL     Labs/Studies:  Labs and Studies from the last 24hrs per EMR and Reviewed    Objective:  Temp:  [36.3 ??C-37.7 ??C] 37.2 ??C  Heart Rate:  [91-122] 115  Resp:  [16-20] 18  BP: (108-151)/(57-90) 130/71  SpO2:  [94 %-96 %]  95 %    Gen: well appearing young man, no acute distress, sitting upright in chair, breathing comfortably  HEENT: MMM  CV: RRR, normal S1, S2, no murmur, rubs, gallops  Pulm: lungs clear bilaterally, normal work of breathing, no wheezes, rhonchi or crackles  Extr: warm and well perfused  Neuro: moving all extremities, answering questions appropriately, speech fluent, facial expression symmetric  Skin: no rashes noted

## 2017-06-01 NOTE — Unmapped (Signed)
Adult Inpatient CF Initial Assessment        Christian Young is a 31 y.o. male who was admitted for Shortness of breath [R06.02]  Cystic fibrosis with pulmonary exacerbation (CMS-HCC) [E84.0]  Fever, unspecified fever cause [R50.9].    Heart Rate: 124  Respiratory Rate: 20  Bilateral Breath Sounds: clear;diminished    Cough/Sputum: How Much? More frequent than usual; mostly dry                              Color:    Are they coughing up blood? Yes:                                                       No: x                                                     How Much?    Home medication & frequency :    Albuterol Neb 2.5mg  BID  Albuterol HFA 4mcg/2puffs PRN  Hypertonic Saline 7% BID  Pulmozyme 2.5mg  Qday  Amikacin BID  Symbicort HFA BID      Home airway clearance & frequency:    Vest BID       Times Chosen for Inhaled Medications and Airway Clearance:  1000    Methods tried & found to be ineffective:   Vibralung; metaneb    Home O2? Yes:                       No:x  If yes, what is their home requirement:     Home non-invasive ventilation? Yes:                                                          No:x  If yes, enter type of device, settings, & mode:    Airway clearance technique method offered & decided upon:   [place]  Comments:

## 2017-06-01 NOTE — Unmapped (Signed)
Problem: Patient Care Overview  Goal: Plan of Care Review  Outcome: Progressing  Patient AOX4, VSS throughout shift. Patient c/o pain last night relieved by PRN tylenol. All medications given on time as ordered. Patient tolerated abx well. Patient remained in bed throughout night. POC maintained, will continue to monitor.

## 2017-06-01 NOTE — Unmapped (Signed)
Problem: Patient Care Overview  Goal: Individualization and Mutuality  Outcome: Progressing    Goal: Discharge Needs Assessment  Outcome: Progressing   06/01/17 0530   Discharge Needs Assessment   Concerns to be Addressed no discharge needs identified

## 2017-06-02 LAB — TOBRAMYCIN RANDOM
Tobramycin:MCnc:Pt:Ser/Plas:Qn:: 2.3
Tobramycin:MCnc:Pt:Ser/Plas:Qn:: 5.4

## 2017-06-02 NOTE — Unmapped (Signed)
Problem: Patient Care Overview  Goal: Plan of Care Review  Outcome: Progressing  Patient AOX4, VSS throughout shift. Patient c/o pain last night relieved by PRN oxycodone. All medications given on time as ordered. Patient tolerated abx well. Patient remained in bed throughout night. Maintenance order placed on thermostat in room as patient c/o temp being too warm. POC maintained, will continue to monitor.

## 2017-06-02 NOTE — Unmapped (Signed)
Problem: Patient Care Overview  Goal: Plan of Care Review  Outcome: Progressing   06/01/17 1903   OTHER   Plan of Care Reviewed With patient   Plan of Care Review   Progress improving     Pt is pleasant, cooperative and independent in ADLs. Tobramycin was hung at 600 but phlebotomy was not able to draw the levels until approximately 1230. Second draw was cancelled. Patient still complains of chest pain after coughing and received a dose of PRN tylenol and PRN oxycodone in the morning, which brought the pain to a manageable level. VSS, will continue to monitor.     Problem: Pain, Acute (Adult)  Goal: Identify Related Risk Factors and Signs and Symptoms  Related risk factors and signs and symptoms are identified upon initiation of Human Response Clinical Practice Guideline (CPG).   Outcome: Progressing      Problem: Infection, Risk/Actual (Adult)  Goal: Identify Related Risk Factors and Signs and Symptoms  Related risk factors and signs and symptoms are identified upon initiation of Human Response Clinical Practice Guideline (CPG).   Outcome: Progressing      Problem: Cystic Fibrosis (Adult)  Goal: Signs and Symptoms of Listed Potential Problems Will be Absent, Minimized or Managed (Cystic Fibrosis)  Signs and symptoms of listed potential problems will be absent, minimized or managed by discharge/transition of care (reference Cystic Fibrosis (Adult) CPG).   Outcome: Progressing      Problem: VTE, DVT and PE (Adult)  Goal: Signs and Symptoms of Listed Potential Problems Will be Absent, Minimized or Managed (VTE, DVT and PE)  Signs and symptoms of listed potential problems will be absent, minimized or managed by discharge/transition of care (reference VTE, DVT and PE (Adult) CPG).   Outcome: Progressing

## 2017-06-02 NOTE — Unmapped (Signed)
Patient did well with doing all of their treatments today. Patient did their airway clearance without complications.

## 2017-06-02 NOTE — Unmapped (Signed)
Problem: Patient Care Overview  Goal: Plan of Care Review  Outcome: Progressing  No acute events. Pt received meds as ordered. Pt reported right sided chest pain. Received tylenol and oxycodone PRN. See MAR. Pt reported effective. Independent with ADL's. Ambulates in room. Vitals stable on room    06/02/17 1502   OTHER   Plan of Care Reviewed With patient   Plan of Care Review   Progress improving   air.     Problem: Pain, Acute (Adult)  Goal: Acceptable Pain Control/Comfort Level  Patient will demonstrate the desired outcomes by discharge/transition of care.   Outcome: Progressing   06/02/17 1502   Pain, Acute (Adult)   Acceptable Pain Control/Comfort Level making progress toward outcome       Problem: Infection, Risk/Actual (Adult)  Goal: Infection Prevention/Resolution  Patient will demonstrate the desired outcomes by discharge/transition of care.   Outcome: Progressing   06/02/17 1502   Infection, Risk/Actual (Adult)   Infection Prevention/Resolution making progress toward outcome       Problem: Cystic Fibrosis (Adult)  Goal: Signs and Symptoms of Listed Potential Problems Will be Absent, Minimized or Managed (Cystic Fibrosis)  Signs and symptoms of listed potential problems will be absent, minimized or managed by discharge/transition of care (reference Cystic Fibrosis (Adult) CPG).   Outcome: Progressing      Problem: VTE, DVT and PE (Adult)  Goal: Signs and Symptoms of Listed Potential Problems Will be Absent, Minimized or Managed (VTE, DVT and PE)  Signs and symptoms of listed potential problems will be absent, minimized or managed by discharge/transition of care (reference VTE, DVT and PE (Adult) CPG).   Outcome: Progressing

## 2017-06-02 NOTE — Unmapped (Signed)
Med G Daily Progress Note    Assessment/Plan:    Principal Problem:    Cystic fibrosis with pulmonary exacerbation (CMS-HCC)  Active Problems:    Essential hypertension    History of DVT (deep vein thrombosis)    Depressive disorder, r/o major depression, recurrent, moderate/severe and dysthymic disorder    Chronic pansinusitis    Pancreatic insufficiency due to cystic fibrosis (CMS-HCC)  Resolved Problems:    * No resolved hospital problems. *        Pressure Ulcer(s)    Active Pressure Ulcer     None                 Christian Young is a 31 y.o. male who presented to [facility] with Cystic fibrosis with pulmonary exacerbation (CMS-HCC).    Cystic fibrosis exacerbation with pulmonary manifestations: History sounds like viral infection including sick contacts, fever, chills, myalgias; However, negative for flu/RSV. Currently treating Pseudomonas given previous cultures. Patient still without home clofazimine and Symdeco until Tuesday when someone can bring them to him  - blood cultures, CF sputum culture pending  - AFB culture pending. Continue M. Abscessus treatment  - culture history:              - 02/2017: M abscessus (S: amikacin, clarithro, I: linezolid, imipenem, cefoxitin)              - 03/2017: stenotrophomonas (S: levoflox, minocycline, bactrim)              - 03/2017: smooth pseudomonas (S: cipro, levoflox, tobramycin, I: Zosyn, aztreonam)  - antibiotics this admission:                          - for pseudomonas: Zosyn, tobramycin IV              - for steno: home regimen with azithromycin, clofazimine but hold inhaled amikacin given IV tobra  - may need repeat chest CT if not improving on abx above to evaluate for worsening M abscessus   - airway clearance: hypertonic nebs 7% QID, albuterol QID w/ prn, Pulmozyme  - mechanical airway clearance per RT  - spirometry  ??  HTN: Continue home lisinopril, Coreg  ??  CF related pancreatic insufficiency: Continue Creon (substituting 24,000 unit for home 36,000 unit while inpatient)  ??  Chronic sinusitis: continue home flonase, zyrtec, singulair  ??  Hx DVT: continue home rivaroxaban  ??  Restless leg syndrome: continue home mirapax  ??  Depression, hx SI: continue home lamictal, vilazodone    FEN/GI: regular diet    DVT ppx: rivaroxaban     Code status: Full code  ___________________________________________________________________    Subjective:  No acute events overnight.     Recent Results (from the past 24 hour(s))   Tobramycin Level, Random    Collection Time: 06/01/17 12:27 PM   Result Value Ref Range    Tobramycin Rm 3.1 Undefined ug/mL     Labs/Studies:  Labs and Studies from the last 24hrs per EMR and Reviewed    Objective:  Temp:  [36.5 ??C-37.2 ??C] 36.5 ??C  Heart Rate:  [91-120] 91  Resp:  [18] 18  BP: (119-151)/(58-86) 123/58  SpO2:  [94 %-96 %] 94 %    Gen: well appearing young man, no acute distress. Sleeping calmly this morning.  HEENT: MMM  CV: RRR, normal S1, S2, no murmur, rubs, gallops  Pulm: lungs clear bilaterally, normal work of breathing, no wheezes,  rhonchi or crackles  Extr: warm and well perfused  Neuro: moving all extremities, answering questions appropriately, speech fluent, facial expression symmetric  Skin: no rashes noted

## 2017-06-03 DIAGNOSIS — R509 Fever, unspecified: Principal | ICD-10-CM

## 2017-06-03 LAB — BASIC METABOLIC PANEL
BLOOD UREA NITROGEN: 14 mg/dL (ref 7–21)
BUN / CREAT RATIO: 16
CALCIUM: 8.7 mg/dL (ref 8.5–10.2)
CHLORIDE: 101 mmol/L (ref 98–107)
CO2: 26 mmol/L (ref 22.0–30.0)
CREATININE: 0.85 mg/dL (ref 0.70–1.30)
EGFR MDRD AF AMER: >=60 mL/min/{1.73_m2} (ref >=60)
GLUCOSE RANDOM: 115 mg/dL (ref 65–179)
POTASSIUM: 4.5 mmol/L (ref 3.5–5.0)
SODIUM: 139 mmol/L (ref 135–145)

## 2017-06-03 LAB — CBC
HEMATOCRIT: 37 % — ABNORMAL LOW (ref 41.0–53.0)
HEMOGLOBIN: 11.8 g/dL — ABNORMAL LOW (ref 13.5–17.5)
MEAN CORPUSCULAR HEMOGLOBIN CONC: 32 g/dL (ref 31.0–37.0)
MEAN CORPUSCULAR HEMOGLOBIN: 24.8 pg — ABNORMAL LOW (ref 26.0–34.0)
MEAN CORPUSCULAR VOLUME: 77.5 fL — ABNORMAL LOW (ref 80.0–100.0)
MEAN PLATELET VOLUME: 8.9 fL (ref 7.0–10.0)
RED BLOOD CELL COUNT: 4.78 10*12/L (ref 4.50–5.90)
RED CELL DISTRIBUTION WIDTH: 15.9 % — ABNORMAL HIGH (ref 12.0–15.0)
WBC ADJUSTED: 5.6 10*9/L (ref 4.5–11.0)

## 2017-06-03 LAB — MEAN CORPUSCULAR HEMOGLOBIN CONC: Lab: 32

## 2017-06-03 LAB — GLUCOSE RANDOM: Glucose:MCnc:Pt:Ser/Plas:Qn:: 115

## 2017-06-03 NOTE — Unmapped (Signed)
Pt contacted SW after calling [employer]. His insurance through his wife's employer was reinstated. He plans to continue working on disability application.    Serita Grit, LCSW

## 2017-06-03 NOTE — Unmapped (Signed)
Med G Daily Progress Note    Assessment/Plan:    Principal Problem:    Cystic fibrosis with pulmonary exacerbation (CMS-HCC)  Active Problems:    Essential hypertension    History of DVT (deep vein thrombosis)    Depressive disorder, r/o major depression, recurrent, moderate/severe and dysthymic disorder    Chronic pansinusitis    Pancreatic insufficiency due to cystic fibrosis (CMS-HCC)  Resolved Problems:    * No resolved hospital problems. *        Pressure Ulcer(s)    Active Pressure Ulcer     None                 Christian Young is a 31 y.o. male who presented to [facility] with Cystic fibrosis with pulmonary exacerbation (CMS-HCC).    Cystic fibrosis exacerbation with pulmonary manifestations: Patient reports doing well overnight. Afebrile. No leukocytosis. History sounds like viral infection including sick contacts, fever, chills, myalgias; However, negative for flu/RSV. Currently treating Pseudomonas given previous cultures. Patient has been without home clofazimine and symdeco. Plan was for spouse to bring these meds on 10/23. PFTs on 10/22 show FEV1 of 57.5% (down from 74% in July 2018).   - blood cultures, CF sputum culture pending  - AFB culture pending   - culture history:              - 02/2017: M abscessus (S: amikacin, clarithro, I: linezolid, imipenem, cefoxitin)              - 03/2017: stenotrophomonas (S: levoflox, minocycline, bactrim)              - 03/2017: smooth pseudomonas (S: cipro, levoflox, tobramycin, I: Zosyn, aztreonam)  - antibiotics this admission:                          - for pseudomonas: Zosyn, tobramycin IV              - for steno: currently holding home azithromycin to avoid causing MABS resistance since patient not currently on clofazimine. Holding amikacin since he is receiving IV tobra.  - may need repeat chest CT if not improving on abx above to evaluate for worsening M abscessus   - airway clearance: hypertonic nebs 7% QID, albuterol QID w/ prn, Pulmozyme  - mechanical airway clearance per RT  - spirometry: FEV1 57.5% on 10/22  ??  HTN: Continue home lisinopril, Coreg  ??  CF related pancreatic insufficiency: Continue Creon (substituting 24,000 unit for home 36,000 unit while inpatient)  ??  Chronic sinusitis: continue home flonase, zyrtec, singulair  ??  Hx DVT: continue home rivaroxaban  ??  Restless leg syndrome: continue home mirapax  ??  Depression, hx SI: continue home lamictal, vilazodone    FEN/GI: regular diet    DVT ppx: rivaroxaban     Code status: Full code  ___________________________________________________________________    Subjective:  No acute events overnight.     Recent Results (from the past 24 hour(s))   Basic Metabolic Panel    Collection Time: 06/03/17  9:08 AM   Result Value Ref Range    Sodium 139 135 - 145 mmol/L    Potassium 4.5 3.5 - 5.0 mmol/L    Chloride 101 98 - 107 mmol/L    CO2 26.0 22.0 - 30.0 mmol/L    BUN 14 7 - 21 mg/dL    Creatinine 9.14 7.82 - 1.30 mg/dL    BUN/Creatinine Ratio 16  EGFR MDRD Non Af Amer >=60 >=60 mL/min/1.110m2    EGFR MDRD Af Amer >=60 >=60 mL/min/1.4m2    Anion Gap 12 9 - 15 mmol/L    Glucose 115 65 - 179 mg/dL    Calcium 8.7 8.5 - 16.1 mg/dL   CBC    Collection Time: 06/03/17  9:08 AM   Result Value Ref Range    WBC 5.6 4.5 - 11.0 10*9/L    RBC 4.78 4.50 - 5.90 10*12/L    HGB 11.8 (L) 13.5 - 17.5 g/dL    HCT 09.6 (L) 04.5 - 53.0 %    MCV 77.5 (L) 80.0 - 100.0 fL    MCH 24.8 (L) 26.0 - 34.0 pg    MCHC 32.0 31.0 - 37.0 g/dL    RDW 40.9 (H) 81.1 - 15.0 %    MPV 8.9 7.0 - 10.0 fL    Platelet 353 150 - 440 10*9/L   Spirometry    Collection Time: 06/03/17  2:37 PM   Result Value Ref Range    FEV1 PRE 2.73 (L) 3.91815 - 5.58657 L    FEV1/FVC PRE 74.82 72.3966 - 91.7526 %    FVC PRE 3.65 (L) 4.85513 - 6.82897 L    PEF PRE 8.82 8.26282 - 13.1703 L/s    Vol extrap pre 0.11 L    FIVC PRE 3.39 (L) 4.85513 - 6.82897 L    PIF PRE 6.59 (H) 4.47014 - 4.47014 L/s    FIF50% PRED 6.33 3.4204 - 7.6316 L/s    FEV6 PRE 3.52 (L) 4.81 - 6.72892 L FEV1/FEV6 PRE 77.75 74.3642 - 92.3002 %    FEF50% PRE 4.87 L/s    FEF25-75% PRE 2.07 (L) 3.02496 - 6.40802 L/s    BJY/NWG95 pre 76.84 %    ISOFEF25-75 PRE 2.07 L/s    FET100% Change 11.66 sec     Labs/Studies:  Labs and Studies from the last 24hrs per EMR and Reviewed    Objective:  Temp:  [36.4 ??C-36.8 ??C] 36.5 ??C  Heart Rate:  [90-114] 106  Resp:  [18-20] 20  BP: (118-141)/(68-91) 120/68  SpO2:  [94 %-99 %] 99 %    Gen: well appearing young man, no acute distress. Sleeping calmly this morning.  HEENT: MMM  CV: RRR, normal S1, S2, no murmur, rubs, gallops  Pulm: lungs clear bilaterally, normal work of breathing, no wheezes, rhonchi or crackles  Extr: warm and well perfused  Neuro: moving all extremities, answering questions appropriately, speech fluent, facial expression symmetric  Skin: no rashes noted

## 2017-06-03 NOTE — Unmapped (Signed)
Problem: Patient Care Overview  Goal: Plan of Care Review  Outcome: Progressing  Patient AOX4, VSS throughout shift. Patient c/o pain last night relieved by PRN oxycodone. All medications given on time as ordered. Patient tolerated abx well. Patient remained in bed throughout night. POC maintained, will continue to monitor.

## 2017-06-03 NOTE — Unmapped (Signed)
Christian Young is a 31 y.o. male currently admitted with CF exacerbation for IV antibiotics. Pt rpts that he has lost his BCBS through his wife's employer.     Pt has reached out to [employer] regarding ACA plans. Plans have not been released for 2019 yet. Pt has also been in contact with CF legal regarding his Disability application- there is some outstanding paperwork that pt needs to complete for their office that he did not bring with him to the hospital. SW reached out to Ivar Bury, paralegal with [employer], to request paperwork be forwarded to SW so pt could complete this admission.     SW asked inpatient CM to provide [facility] applications.    Serita Grit, LCSW

## 2017-06-03 NOTE — Unmapped (Signed)
Care Management  Initial Transition Planning Assessment              General  Care Manager assessed the patient by : In person interview with patient, Medical record review, Discussion with Clinical Care team  Who provides care at home?: N/A    Contact/Decision Maker:    Contact Details  Contact Details: Primary Contact  Primary Contact Name: Shawnn Bouillon  Primary Contact Relationship: Spouse  Phone #1: [phone]    Advance Directive (Medical Treatment)  Does patient have an advance directive covering medical treatment?: Patient has advance directive covering medical treatment, copy not in chart.  Type of advance directive: : Health care power of attorney  Advance directive covering medical treatment not in Chart:: Copy requested from family  Surrogate decision maker appointed:: Other (Comment)  Surrogate decision maker's name:: Rutherford Guys  Information provided on advance directive:: No  Patient requests assistance:: No    Advance Directive (Mental Health Treatment)  Does patient have an advance directive covering mental health treatment?: Patient does not have advance directive covering mental health treatment.  Reason patient does not have an advance directive covering mental health treatment:: Patient does not wish to complete one at this time.    Patient Information:    Lives with: Spouse/significant other    Type of Residence: Private residence      Type of Residence: Mailing Address:  [address]   [address]  [place] [address]  Contacts: Accompanied by: Alone  Contact Details: Primary Contact  Primary Contact Name: Jeri Rawlins  Primary Contact Relationship: Spouse  Phone #1: [phone]  Patient Phone Number: [phone] (home)     Medical Provider(s): [facility]  Reason for Admission: Admitting Diagnosis:  Shortness of breath [R06.02]  Cystic fibrosis with pulmonary exacerbation (CMS-HCC) [E84.0]  Fever, unspecified fever cause [R50.9]  Past Medical History:   has a past medical history of Anxiety; Chronic pain disorder; Cystic fibrosis (CMS-HCC); Depression; and Hypertension.  Past Surgical History:   has a past surgical history that includes pr removal of lung,lobectomy (Right, 03/29/2017).   Previous admit date: 03/18/2017    Primary Insurance- Payor: BCBS / Plan: BCBS OOS PLAN / Product Type: *No Product type* /   Secondary Insurance ??? None  Prescription Coverage ???   Preferred Pharmacy - [id number] [place], [place] - [address]  [facility] [place], [place] - [address]  [facility] [place] [place] [address]  [facility] - [place], [place] - [address]  [facility] [place] [place] - [address]    Transportation home: Private vehicle  Level of function prior to admission: Independent     Location/Detail: [address] [place], [place] [address]    Support Systems: Family Members, Spouse, Friends/Neighbors    Responsibilities/Dependents at home?: No    Home Care services in place prior to admission?: Yes     Type of Home Care services in place prior to admission: Other     Current Home Care provider (Name/Phone #): [facility] port care    Outpatient/Community Resources in place prior to admission: [facility]  Agency detail (Name/Phone #): [facility]    Equipment Currently Used at Home: respiratory supplies  Current HME Agency (Name/Phone #): nebulizer, vest, doesnt remeber DME    Currently receiving outpatient dialysis?: No     Financial Information:  Patient source of income: Wife is employed, patient is applying for SSDI    Need for financial assistance?: No     Discharge Needs Assessment:    Concerns to be Addressed: denies needs/concerns at this time    Clinical Risk Factors: Multiple Diagnoses (Chronic)    Barriers to taking medications: No    Prior overnight hospital stay or ED visit in last 90 days: Yes Readmission Within the Last 30 Days: no previous admission in last 30 days    Anticipated Changes Related to Illness: none    Equipment Needed After Discharge: none    Discharge Facility/Level of Care Needs:  (Home with HI)    Patient at risk for readmission?: No    Discharge Plan:    Screen findings are: Discharge planning needs identified or anticipated (Comment).    Expected Discharge Date: 06/05/17    Expected Transfer from Critical Care:      Patient and/or family were provided with choice of facilities / services that are available and appropriate to meet post hospital care needs?: Yes   List choices in order highest to lowest preferred, if applicable. : [facility]    Initial Assessment complete?: Yes

## 2017-06-03 NOTE — Unmapped (Signed)
Problem: Patient Care Overview  Goal: Plan of Care Review  Outcome: Progressing  Pt A&Ox4. VSS. Pt was given all medications per MAR. PRN benadryl given as premed for zosyn. Pt c/o rash and hot flashed during infusion. No rxn noted post infusion and benadryl. PFT completed. Will continue to assess. POC continues.   Goal: Individualization and Mutuality  Outcome: Progressing    Goal: Discharge Needs Assessment  Outcome: Progressing    Goal: Interprofessional Rounds/Family Conf  Outcome: Progressing

## 2017-06-03 NOTE — Unmapped (Signed)
Patient did well with doing all of their treatments today. Patient did their airway clearance without complications.

## 2017-06-03 NOTE — Unmapped (Signed)
Cystic Fibrosis Nutrition Assessment    Inpatient: MD Consult this admission and related follow up  Primary Pulmonologist: Dr. Koren Shiver  ===================================================================  Christian Young is a 31 y.o. male seen for medical nutrition therapy. Currently admitted with CF exacerbation for IV antibiotics.  - s/p RUL lobectomy 03-29-17.  - Reports good appetite, denies s/s of malabsorption, diarrhea, constipation.  - Continues to feel that Creon works well, did not bring home Creon 36,000 with him this admit and is not on inpatient drug formulary. Agreeable to continue to use Creon 24,000 during admission.  - Has been out of some meds PTA due to issues with insurance. Reports this is now resolved.  ===================================================================  INTERVENTION  1. Recommend change diet to High Calorie High Protein.    2. During admission continue Creon 24,000 (11 at meals, 8 at snacks).  - Can resume home Creon 36,000 after discharge.    3. Recommend increase supplemental vitamin D (only during admission) to 6000 International units vitamin D3 daily.  Continue inpatient CF vitamin regimen:  MVW Complete Formulation gel cap 2 daily   - note inpatient CF vitamin + 6000 International units vitamin D daily daily = total of 9000 International units vitamin D daily.  - When discharged home resume usual Complete Formulation D5000 softgels, 2 daily. Provides total of 10,000 International units vitamin D daily..    4. Monitor blood glucose levels.    5. Weigh patient twice weekly this admit.    6. Continue remainder of nutrition regimen:  - acid reducer  - bowel regimen    Inpatient:   Will follow up with patient per protocol: 1-2 times per week (and more frequent as indicated)===================================================================  ASSESSMENT:  Cystic Fibrosis Nutrition Category = Outstanding    Current diet is appropriate for CF. Patient continues to work towards goals for weight management.   Enzyme dose is within established guidelines. Vitamin prescription is not appropriate to reach/maintain optimal fat soluble vitamin levels. Patient would benefit from change in vitamin regimen. Sodium needs for CF met with PO and/or supplement. Bowel regimen is appropriate. Acid reducer appropriate for GERD and enzyme activation.    Malnutrition Assessment using AND/ASPEN Clinical Characteristics:  Patient does not meet AND/ASPEN criteria for malnutrition at this time (06/03/17 1604)    Nutrition Focused Physical Exam:  Nutrition Evaluation  Overall Impressions: Nutrition-Focused Physical Exam not indicated due to lack of malnutrition risk factors. No fat or muscle loss noted on brief visual exam (06/03/17 1604)     Goals:  1. Meet estimated daily needs: 1610-9604 kcals (24-28 kcals/kg/day); 146-183 gm pro (1.2-1.5 gm pro/kg/day)  2. Reach/maintain established goals for CF:                Adult - BMI 22kg/m2 for CF females and 23kg/m2 for CF males  3. Normal fat-soluble vitamin levels: Vitamin A, Vitamin E and PT per lab range; Vitamin D 25OH total >30  4. Maintain glucose control. Carbohydrate content of diet should comprise 40-50% of total calorie needs, but carbohydrates are not restricted in this population.    5.  Meet sodium needs for CF  ===================================================================  INPATIENT:    Current Nutrition Orders (inpatient):       Nutrition Orders            Start     Ordered    05/30/17 2344  Nutrition Therapy General (Regular)  Effective now     Question:  Nutrition Therapy (T):  Answer:  General (Regular)  05/30/17 2343        CF Nutrition related medications (inpatient): Nutritionally relevant medications reviewed.   1000 International units vitamin D3 daily  Colace  Magnesium oxide  Complete Formulation 2 softgels daily  Omeprazole  Creon 24,000 (11 at meals, 8 at snacks) provides 2200 units lipase/kg/meal & 16109 units lipase/kg/day  xarelto  Miralax TID  Symdeko  ondansetron PRN    CF Nutrition related labs (inpatient):  06-03-17: none pertinent, Glu WNL    Last 5 Recorded Weights    05/30/17 2300 05/31/17 1637 06/02/17 2300   Weight: (!) 118 kg (260 lb 2.3 oz) (!) 121.7 kg (268 lb 4.8 oz) (!) 120.8 kg (266 lb 5.1 oz)   ==================================================================  CLINICAL DATA:  Past Medical History:   Diagnosis Date   ??? Anxiety    ??? Chronic pain disorder    ??? Cystic fibrosis (CMS-HCC)    ??? Depression    ??? Hypertension    Per review of Care Everywhere PMHx also includes: CFRD, PI, essential HTN    Anthroprometric Evaluation:  Weight changes: Weight up.  Per review of Care Everywhere: March 2018 weight 262 pounds  BMI Readings from Last 1 Encounters:   06/02/17 36.12 kg/m??     Wt Readings from Last 3 Encounters:   06/02/17 (!) 120.8 kg (266 lb 5.1 oz)   04/23/17 (!) 118.4 kg (261 lb)   04/05/17 (!) 119.3 kg (262 lb 14.4 oz)     Ht Readings from Last 3 Encounters:   05/31/17 182.9 cm (6')   04/05/17 180.3 cm (5' 11)   02/05/17 180.3 cm (5' 11)   ==================================================================  Energy Intake (outpatient):  Diet: High in calories, fat, salt. Diet evaluated this visit. Reports good appetite.  Food allergies: Per review of Care Everywhere note banana is listed as allergy.  Diet and CFTR modulators: Prescribed Symdeko (tezacaftor/ivacaftor).    PO Supplements: none  Appetite Stimulant: none  Enteral feeding tube: n/a  Sodium in diet: Adequate from diet  Calcium in diet:  Adequate from diet    Fat Malabsorption (outpatient):  Enzyme brand, (meals/snacks):   Creon 36,000 (7 at meals, 5 at snacks)   - Switched from Zenpep to Creon due to report of bloating & greasy stools when on Zenpep.   Enzyme administration details: correct pre-meal administration., moderate compliance, swallows capsules whole  Enzyme dose per MEAL (units lipase/kg/meal) 2100  Enzyme dose per DAY (units lipase/kg/day) 10800  Stools: denies s/s of malabsorption/diarrhea/constipation  Abdominal pain: None reported  Fecal Fat Studies:    Pancreatic Elastase-1   Date Value Ref Range Status   03/19/2017 <15 (L) mcg/g Final     Comment:     Adult and Pediatric Reference Ranges for    Pancreatic Elastase-1:                  Normal:      >200 mcg/g  Moderate Pancreatic        Insufficiency:   100-200 mcg/g    Severe Pancreatic        Insufficiency:      <100 mcg/g     Elastase-1 (E-1) assay results are expressed  in mcg/g, which represent mcg E1/g feces.     It is not necessary to interrupt enzyme  substitution therapy.     Test Performed by:  [facility]  [address]  [place], [place] [address]     GI meds: Nutritionally relevant medications reviewed. MVW probiotic, omeprazole.  Vitamins/Minerals (outpatient):  CF-specific MVI, dose, compliance: MVW Complete Formulation Softgel D-5000 2 daily  Other vitamins/minerals/herbals: none  Calcium supplement: none  Patient Resources: -[employer] (phone [phone] [email or web address])   - Orders CF vitamin and probiotic directly from manufacturer (MVW), cost billed directly to [employer] grant  - He is thinks he may also be enrolled in [employer] but he is unsure at this time.  Fat-soluble vitamin levels:  Lab Results   Component Value Date/Time    VITAMINA 44.4 11/19/2016 0647     Lab Results   Component Value Date/Time    VITDTOTAL 27.9 03/19/2017 0508        Lab Results   Component Value Date/Time    PT 11.1 03/29/2017 0508    PT 11.5 03/28/2017 0519    PT 14.1 (H) 11/29/2016 0807    PT 15.3 (H) 11/26/2016 0529    PT 16.1 (H) 11/23/2016 0402   03-20-17: normal DesCarboxyPT (also known as PIVKA) indicates no vitamin K deficiency.      Bone Health: unknown     CF Related Diabetes: Yes, Per Care EveryWhere has been on 5 units levemir at bedtime in the past. Also noted per Care Everywhere he was supposed to have had Endo appointment at outside facility in March 2018 - unclear if he made it to this appointment.        Lab Results   Component Value Date/Time    A1C 6.1 (H) 11/19/2016 0981

## 2017-06-03 NOTE — Unmapped (Signed)
Aminoglycoside Follow-Up Pharmacy Note     Christian Young is a 31 y.o. male on tobramycin 460 mg IV every 24 hours for CF exacerbation.      D1: 10/19  D14: 11/2     Current abx: zosyn 4.5 g q6h and tobramycin 460 mg q24h (1hr infusion)  IV access: Port     Goal peak: 12-14 mcg/mL  Goal trough: <0.6 mcg/mL     Last dose: 06:12 (1 hr infusion)     Measured peak 10/21 = 5.4 mcg/mL (~3.5 hr after end of infusion)  Calculated peak = 14.5 mcg/mL     Measured trough/random 10/21 = 2.3 mcg/mL (~16 hr before next dose)  Calculated trough = 0.024 mcg/mL  Est. Drug free interval: ~11.5 hr     Patient-Specific Pharmacokinetic Parameters:  Vd =~32   L, ke = 0.284 hr-1     Pertinent labs: BUN/SCr 16/0.81, 19/0.77, 20/0.90   K 4.2, Mg 1.8     Recommend at this time unless clinically indicated otherwise   1. Continued tobra 460 mg q24h (1h infusion) for now with the possibility of starting inhaled amikacin. If not starting inhaled today, will increase dose to 480mg  tomorrow (10/24).   2. Recheck tobra levels in 2-3 days, sooner if renal fx/clinical status worsens   3. Continue to monitor bun/scr, K/Mg at least 2x/week   4. Minimize the use of NSAIDs while on IV tobra - incr risk for nephrotoxicity     Pharmacy will continue to monitor for changes in renal function, toxicity, and efficacy and order additional levels as needed. Please page service pharmacist with questions/clarifications.     Sharma Covert, PharmD  PGY1 Pharmacy Resident  Pager: [phone]

## 2017-06-04 MED FILL — SYMDEKO/100-150/TBPK: SYMDEKO/100-150/TBPK | 28 days supply | Qty: 56 | Fill #0

## 2017-06-04 NOTE — Unmapped (Signed)
Problem: Cystic Fibrosis (Adult)  Goal: Signs and Symptoms of Listed Potential Problems Will be Absent, Minimized or Managed (Cystic Fibrosis)  Signs and symptoms of listed potential problems will be absent, minimized or managed by discharge/transition of care (reference Cystic Fibrosis (Adult) CPG).   Patient did well with doing all of their treatments today. Patient did their airway clearance without complications.

## 2017-06-04 NOTE — Unmapped (Signed)
Problem: Patient Care Overview  Goal: Plan of Care Review  Outcome: Progressing  Meds given per MAR. Pt given Benadryl prior to Zosyn. VSS. SpO2 WNL on RA. No s/sx of respiratory distress noted. Pt denies any sputum production. No c/o pain. POC continues       Problem: Pain, Acute (Adult)  Goal: Identify Related Risk Factors and Signs and Symptoms  Related risk factors and signs and symptoms are identified upon initiation of Human Response Clinical Practice Guideline (CPG).   Outcome: Progressing    Goal: Acceptable Pain Control/Comfort Level  Patient will demonstrate the desired outcomes by discharge/transition of care.   Outcome: Progressing   06/04/17 0505   Pain, Acute (Adult)   Acceptable Pain Control/Comfort Level making progress toward outcome       Problem: Infection, Risk/Actual (Adult)  Goal: Identify Related Risk Factors and Signs and Symptoms  Related risk factors and signs and symptoms are identified upon initiation of Human Response Clinical Practice Guideline (CPG).   Outcome: Progressing   06/04/17 0505   Infection, Risk/Actual (Adult)   Related Risk Factors (Infection, Risk/Actual) chronic illness/condition     Goal: Infection Prevention/Resolution  Patient will demonstrate the desired outcomes by discharge/transition of care.   Outcome: Progressing   06/04/17 0505   Infection, Risk/Actual (Adult)   Infection Prevention/Resolution making progress toward outcome       Problem: Cystic Fibrosis (Adult)  Goal: Signs and Symptoms of Listed Potential Problems Will be Absent, Minimized or Managed (Cystic Fibrosis)  Signs and symptoms of listed potential problems will be absent, minimized or managed by discharge/transition of care (reference Cystic Fibrosis (Adult) CPG).   Outcome: Progressing      Problem: VTE, DVT and PE (Adult)  Goal: Signs and Symptoms of Listed Potential Problems Will be Absent, Minimized or Managed (VTE, DVT and PE)  Signs and symptoms of listed potential problems will be absent, minimized or managed by discharge/transition of care (reference VTE, DVT and PE (Adult) CPG).   Outcome: Progressing   06/04/17 0505   VTE, DVT and PE (Adult)   Problems Assessed (VTE, DVT, PE) all   Problems Present (VTE, DVT, PE) none

## 2017-06-04 NOTE — Unmapped (Signed)
Hospitalized-moving specialty refill reminder call to appropriate date.

## 2017-06-04 NOTE — Unmapped (Signed)
Med G Daily Progress Note    Assessment/Plan:    Principal Problem:    Cystic fibrosis with pulmonary exacerbation (CMS-HCC)  Active Problems:    Essential hypertension    History of DVT (deep vein thrombosis)    Depressive disorder, r/o major depression, recurrent, moderate/severe and dysthymic disorder    Chronic pansinusitis    Pancreatic insufficiency due to cystic fibrosis (CMS-HCC)  Resolved Problems:    * No resolved hospital problems. *   Malnutrition Evaluation as performed by RD, LDN: Patient does not meet AND/ASPEN criteria for malnutrition at this time (06/03/17 1604)    Pressure Ulcer(s)    Active Pressure Ulcer     None                 Christian Young is a 31 y.o. male who presented to [facility] with Cystic fibrosis with pulmonary exacerbation (CMS-HCC).    Cystic fibrosis exacerbation with pulmonary manifestations: Patient reports doing well overnight. Afebrile. No leukocytosis. Still have not been able to get sputum sample for culture. Currently treating Pseudomonas given previous cultures. Patient has been without home clofazimine and symdeco, but pharmacy was able to get symdeco. Will restart this today.   - blood cultures, CF sputum culture pending  - AFB culture pending          - Zosyn, tobramycin IV for PsA  - Minocycline for Steno  - Not treating for MABS because cannot obtain home clofazamine and don't want to single-cover.  - may need repeat chest CT if not improving on abx above to evaluate for worsening M abscessus   - airway clearance: hypertonic nebs 7% QID, albuterol QID w/ prn, Pulmozyme  - mechanical airway clearance per RT  - spirometry: FEV1 57.5% on 10/22  ??  HTN: Continue home lisinopril, Coreg  ??  CF related pancreatic insufficiency: Continue Creon (substituting 24,000 unit for home 36,000 unit while inpatient)  ??  Chronic sinusitis: continue home flonase, zyrtec, singulair  ??  Hx DVT: continue home rivaroxaban  ??  Restless leg syndrome: continue home mirapax  ??  Depression, hx SI: continue home lamictal, vilazodone    FEN/GI: regular diet    DVT ppx: rivaroxaban     Code status: Full code  ___________________________________________________________________    Subjective:  No acute events overnight.     No results found for this or any previous visit (from the past 24 hour(s)).  Labs/Studies:  Labs and Studies from the last 24hrs per EMR and Reviewed    Objective:  Temp:  [36.9 ??C-37.3 ??C] 37.3 ??C  Heart Rate:  [94-104] 94  Resp:  [18-24] 18  BP: (127-132)/(76-95) 127/76  SpO2:  [97 %-99 %] 97 %    Gen: well appearing young man, no acute distress. Sleeping calmly this morning.  HEENT: MMM  CV: RRR, normal S1, S2, no murmur, rubs, gallops  Pulm: lungs clear bilaterally, normal work of breathing, no wheezes, rhonchi or crackles  Extr: warm and well perfused  Neuro: moving all extremities, answering questions appropriately, speech fluent, facial expression symmetric  Skin: no rashes noted

## 2017-06-05 LAB — BASIC METABOLIC PANEL
ANION GAP: 15 mmol/L (ref 9–15)
BLOOD UREA NITROGEN: 17 mg/dL (ref 7–21)
BUN / CREAT RATIO: 18
CALCIUM: 9.2 mg/dL (ref 8.5–10.2)
CHLORIDE: 98 mmol/L (ref 98–107)
CO2: 24 mmol/L (ref 22.0–30.0)
EGFR MDRD AF AMER: >=60 mL/min/{1.73_m2} (ref >=60)
EGFR MDRD NON AF AMER: >=60 mL/min/{1.73_m2} (ref >=60)
POTASSIUM: 4.5 mmol/L (ref 3.5–5.0)
SODIUM: 137 mmol/L (ref 135–145)

## 2017-06-05 LAB — SODIUM: Sodium:SCnc:Pt:Ser/Plas:Qn:: 137

## 2017-06-05 LAB — TOBRAMYCIN RANDOM
Tobramycin:MCnc:Pt:Ser/Plas:Qn:: 13.5
Tobramycin:MCnc:Pt:Ser/Plas:Qn:: 2.8

## 2017-06-05 LAB — MAGNESIUM: Magnesium:MCnc:Pt:Ser/Plas:Qn:: 1.9

## 2017-06-05 LAB — PHOSPHORUS: Phosphate:MCnc:Pt:Ser/Plas:Qn:: 4.3

## 2017-06-05 MED ORDER — MINOCYCLINE 100 MG CAPSULE: 100 mg | capsule | Freq: Two times a day (BID) | 0 refills | 0 days | Status: AC

## 2017-06-05 MED ORDER — MINOCYCLINE 100 MG CAPSULE
ORAL_CAPSULE | Freq: Two times a day (BID) | ORAL | 1 refills | 0.00000 days | Status: CP
Start: 2017-06-05 — End: 2018-04-05

## 2017-06-05 MED ORDER — ALBUTEROL SULFATE HFA 90 MCG/ACTUATION AEROSOL INHALER
Freq: Four times a day (QID) | RESPIRATORY_TRACT | 1 refills | 0.00000 days | Status: CP | PRN
Start: 2017-06-05 — End: 2017-06-05

## 2017-06-05 MED ORDER — AZITHROMYCIN 500 MG TABLET: tablet | 1 refills | 0 days

## 2017-06-05 MED ORDER — CARVEDILOL 3.125 MG TABLET: 3 mg | tablet | Freq: Two times a day (BID) | 1 refills | 0 days | Status: AC

## 2017-06-05 MED ORDER — AZITHROMYCIN 500 MG TABLET: tablet | 1 refills | 0 days | Status: AC

## 2017-06-05 MED ORDER — PIPERACILLIN-TAZOBACTAM 4.5 GRAM/100 ML DEXTROSE(ISO-OSM) IV PIGGYBACK
Freq: Four times a day (QID) | INTRAVENOUS | 0 refills | 0 days | Status: CP
Start: 2017-06-05 — End: 2017-06-14

## 2017-06-05 MED ORDER — MONTELUKAST 10 MG TABLET: 10 mg | tablet | Freq: Every evening | 1 refills | 0 days | Status: AC

## 2017-06-05 MED ORDER — AMIKACIN 500 MG/2 ML INJECTION SOLUTION
0 refills | 0 days
Start: 2017-06-05 — End: 2017-08-22

## 2017-06-05 MED ORDER — MINOCYCLINE 100 MG CAPSULE: 100 mg | capsule | Freq: Two times a day (BID) | 1 refills | 0 days | Status: AC

## 2017-06-05 MED ORDER — CARVEDILOL 3.125 MG TABLET
ORAL_TABLET | Freq: Two times a day (BID) | ORAL | 1 refills | 0.00000 days | Status: CP
Start: 2017-06-05 — End: 2017-06-05

## 2017-06-05 MED ORDER — ALBUTEROL SULFATE HFA 90 MCG/ACTUATION AEROSOL INHALER: 2 | Inhaler | Freq: Four times a day (QID) | 1 refills | 0 days | Status: AC

## 2017-06-05 MED ORDER — MONTELUKAST 10 MG TABLET
ORAL_TABLET | Freq: Every evening | ORAL | 1 refills | 0.00000 days | Status: CP
Start: 2017-06-05 — End: 2017-08-09

## 2017-06-05 MED ORDER — AZITHROMYCIN 500 MG TABLET
ORAL_TABLET | ORAL | 1 refills | 0.00000 days | Status: CP
Start: 2017-06-05 — End: 2017-09-26

## 2017-06-05 MED ORDER — ALBUTEROL SULFATE HFA 90 MCG/ACTUATION AEROSOL INHALER: 2 | g | 1 refills | 0 days

## 2017-06-05 NOTE — Unmapped (Signed)
Christian Young is a 31 y.o. male with CF s/p RUL lobectomy in 03/2017  that presented to [facility] with Cystic fibrosis with pulmonary exacerbation    Cystic fibrosis exacerbation with pulmonary manifestations: FEV1 during this admission was 2.73L (57% predicted) which was significantly decreased from his baseline. Patient was treated with IV zosyn and tobramycin as previous sputum samples had grown PsA sensitive to this Abx. He was not able to provide a sputum sample until the day prior to discharge, but he improved on Abx. He has also grown Stenotrophomonas in the past, so he was treated with Minocycline. 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 Because of this, his MABS treatment was held to avoid treating him with a single agent which would risk him developing resistant organisms. He will need to restart MABS treatment on discharge. He will finish a 2 week course of IV antibiotics as an outpatient.     HTN: Continued home lisinopril, Coreg      CF related pancreatic insufficiency: Continued Creon (substituting 24,000 unit for home 36,000 unit while inpatient)    Chronic sinusitis: continued home flonase, zyrtec, singulair

## 2017-06-05 NOTE — Unmapped (Signed)
Problem: Patient Care Overview  Goal: Plan of Care Review  Outcome: Progressing  Sputum collected on pt. Pt given all medications per MAR. Benadryl given before administration of zosyn. No rxn noted. Pt was able to walk 40 laps in hall during the day. No SOB noted. Will continue to monitor and assess. POC continues.   Goal: Individualization and Mutuality  Outcome: Progressing    Goal: Discharge Needs Assessment  Outcome: Progressing    Goal: Interprofessional Rounds/Family Conf  Outcome: Progressing

## 2017-06-05 NOTE — Unmapped (Signed)
Pharmacy Note: Transition to Outpatient with Home Infusion   Christian Young 31 y.o. male          ----- Home Infusion Dosing /monitoring -------    Disciplines requested: Nursing and Home IV Antibiotics    Physician to follow patient's care (the person listed here will be responsible for signing ongoing orders): [name]    Start of Care Date: 06/05/17  Estimate completion of therapy: 06/14/17,  F/u with provider to see if 3rd week needed    IV ANTIBIOTIC ORDERS:  -Tobramycin 480mg  IV every 24 hours (CF hi-dose, infuse over 60 minutes)-Please dose in the MORNING  -Ceftazidime 2g IV every 8 hours     HT: 6'  WT: 120kg  Bun/scr;     **DO NOT DRAW DRUG LEVELS FROM PORT**    LAB MONITORING ORDERS:    Start labs on 06/10/17 (monday)    Mon/Tue preferred for ONCE weekly labs  Mon and Thu preferred for TWICE weekly    -Tobramycin (CF Hi-Dose)-ONCE WEEKLY: tobramycin random level (drawn 7 hrs after end of infusion time), CBC with diff; TWICE WEEKLY: BUN, SCr, Magnesium    TARGET DRUG LEVELS:  Tobramycin CF-Modified  ** hx of tinnitus/hearing loss with aminoglycoside therapy  Goal peak at END of infusion 12-16mcg/mL, could go higher w/peak if tolerating (ie ~ 18-4mcg/mL)  Trough < 0.15mcg/mL with ~8h drug free interval    **FAX RESULTS TO: **  Pulmonary Clinic IV Antibiotic Triage: [phone]    *PHONE CONTACT*:  CF Patient: [phone]      Regards   Dorothe Elmore, PharmD  MDG Team Pharmacist

## 2017-06-05 NOTE — Unmapped (Signed)
Physician Discharge Summary    Admit date: 05/30/2017    Discharge date and time: 06/05/17    Discharge to: Home    Discharge Service: Pulmonology (MDG)    Discharge Attending Physician: Nyra Jabs, MD    Discharge Diagnoses: CF pulmonary exacerbation     Procedures: none    Pertinent Test Results: labs: CBC, BMP, tobramycin levels, sputum culture     Outpatient provider follow up:  - Complete antibiotic course  - Obtain PFTs, CXR  - Lab work    Hospital Course:  Christian Young is a 31 y.o. male with  CF s/p RUL lobectomy in 03/2017  that presented to [facility] with Cystic fibrosis with pulmonary exacerbation    Cystic fibrosis exacerbation with pulmonary manifestations: FEV1 during this admission was 2.73L (57% predicted) which was significantly decreased from his baseline. Patient was treated with IV zosyn and tobramycin as previous sputum samples had grown PsA sensitive to this Abx. He was not able to provide a sputum sample until the day prior to discharge, but he improved on Abx. He has also grown Stenotrophomonas in the past, so he was treated with Minocycline. 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 Because of this, his MABS treatment was held to avoid treating him with a single agent which would risk him developing resistant organisms. He will need to restart MABS treatment on discharge. He will finish a 2 week course of IV antibiotics as an outpatient.     HTN: Continued home lisinopril, Coreg      CF related pancreatic insufficiency: Continued Creon (substituting 24,000 unit for home 36,000 unit while inpatient)    Chronic sinusitis: continue d home flonase, zyrtec, singulair     Condition at Discharge: stable  Discharge Medications:      Your Medication List      START taking these medications    minocycline 100 MG capsule  Commonly known as:  MINOCIN,DYNACIN  Take 1 capsule (100 mg total) by mouth Two (2) times a day. for 11 days     piperacillin-tazobactam 4.5 gram/100 mL Pgbk  Commonly known as:  ZOSYN  Infuse 100 mL (4.5 g total) into a venous catheter Every six (6) hours. for 9 days     tobramycin 480 mg, OVERFILL 10 mL in sodium chloride 0.9% 100 mL IVPB  Infuse 480 mg into a venous catheter daily. for 8 days Same time every morning between  6am and  9am        CHANGE how you take these medications    amikacin 500 mg/2 mL injection  Commonly known as:  AMIKIN  Do NOT RESUME until discussed with Primary Pulmonologist.   (Withdraw 2 mL (500mg ) and add to neb cup. Administer using nebulizer BID.  What changed:  additional instructions     azithromycin 500 MG tablet  Commonly known as:  ZITHROMAX  DO NOT RESTART until discussed with primary pulmonologist (500mg  daily)  What changed:  ?? how much to take  ?? how to take this  ?? when to take this  ?? additional instructions        CONTINUE taking these medications    albuterol 90 mcg/actuation inhaler  Commonly known as:  PROVENTIL HFA;VENTOLIN HFA  Inhale 2 puffs every six (6) hours as needed.     budesonide-formoterol 160-4.5 mcg/actuation inhaler  Commonly known as:  SYMBICORT  Inhale 2 puffs Two (2) times a day.     carvedilol 3.125 MG tablet  Commonly known as:  COREG  Take 1 tablet (3.125 mg total) by mouth Two (2) times a day.     cetirizine 10 MG tablet  Commonly known as:  ZyrTEC  Take 10 mg by mouth daily.     cholecalciferol (vitamin D3) 1,000 unit tablet  Take 1 tablet (1,000 Units total) by mouth daily.     CLOFAZIMINE ORAL  Take 200 mg by mouth daily.  Notes to patient:  DO NOT RESUME until visit with primary pulmonologist     dornase alfa 1 mg/mL nebulizer solution  Commonly known as:  PULMOZYME  Inhale 2.5 mg daily.     fluticasone 50 mcg/actuation nasal spray  Commonly known as:  FLONASE  2 sprays by Each Nare route daily.     lamoTRIgine 100 MG tablet  Commonly known as:  LaMICtal  Take 100 mg by mouth daily. lipase-protease-amylase 36,000-114,000- 180,000 unit Cpdr  Commonly known as:  CREON  Take 7-8 capsules with meals and 5-6 capsules with snacks     lisinopril 10 MG tablet  Commonly known as:  PRINIVIL,ZESTRIL  Take 10 mg by mouth daily.     magnesium oxide 400 mg (241.3 mg magnesium) tablet  Commonly known as:  MAG-OX  Take 2 tablets (800 mg total) by mouth Two (2) times a day.     melatonin 3 mg Tab  Take 2 tablets (6 mg total) by mouth nightly.     montelukast 10 mg tablet  Commonly known as:  SINGULAIR  Take 1 tablet (10 mg total) by mouth nightly.     MVW Complete (pediatric multivit 61-D3-vit K) 1,500-800 unit-mcg Cap  Take 1 capsule by mouth Two (2) times a day.     omeprazole 20 MG capsule  Commonly known as:  PriLOSEC  Take 20 mg by mouth daily.     PARI LC D NEBULIZER Misc  Generic drug:  nebulizers  Provide 1 device  for use with inhaled medication.     pramipexole 0.125 MG tablet  Commonly known as:  MIRAPEX  Take 0.25 mg by mouth daily.     pregabalin 100 MG capsule  Commonly known as:  LYRICA  Take 300 mg by mouth Two (2) times a day.     rivaroxaban 20 mg tablet  Commonly known as:  XARELTO  Take 20 mg by mouth daily with evening meal.     sodium bicarb-sodium chloride 700-2,300 mg Pack  Irrigate each nostril with 120 mL of solution (1 packet mixed with 240 mL of distilled water) BID     sodium chloride 7% 7 % Nebu  Inhale 4 mL by nebulization Two (2) times a day.     sodium chloride Soln  Commonly known as:  NS  Add 1mL NaCl into neb cup along with amikacin.     syringe with needle 3 mL 20 gauge x 1 Syrg  Commonly known as:  SYRINGE 3CC/20GX1  For use with inhaled antibiotic     tezacaftor-ivacaftor tablet  Commonly known as:  SYMDEKO  Take 1 tablet (tezacaftor 100mg /ivacaftor 150mg ) by mouth every AM and 1 tablet (ivacaftor 150mg ) every PM with fatty food     VIIBRYD ORAL  Take 40 mg by mouth daily.            Pending Test Results:      Order Current Status    Basic Metabolic Panel In process  Magnesium In process    Phosphorus In process    CF Sputum/ CF Sinus Culture Preliminary result          Discharge Instructions:   Activity Instructions     Activity as tolerated           Diet Instructions     Discharge diet (specify)       Discharge Nutrition Therapy:  General          Follow Up instructions and Outpatient Referrals     Call MD for:  difficulty breathing, headache or visual disturbances       Call MD for:  extreme fatigue       Call MD for:  hives       Call MD for:  persistent dizziness or light-headedness       Call MD for:  persistent nausea or vomiting       Call MD for:  redness, tenderness, or signs of infection (pain, swelling, redness, odor or green/yellow discharge around incision site)       Call MD for:  severe uncontrolled pain       Call MD for:  temperature >38.5 Celsius       Discharge instructions       You have been admitted to [facility] for evaluation and treatment of CF exacerbation and you were treated with antibiotics. You will be taking IV antibiotics until November, 2. You should continue to take your entire course of treatment, and follow up with you pulmonologist in clinic. Also, do not re-start treatment for Mycobacterium abscessus (azithromycin, amikacin, clofazamine) until you have gone to your clinic appointment    Seek medical attention if you develop fever, chest pain, unexplained/unrelieved shortness of breath, any changes in your mental status, significant abdominal pain, uncontrolled nausea and vomiting such that you cannot stay hydrated or take your medications, worsening swelling, or any other concerning symptoms.  If you develop these symptoms, or if you have trouble obtaining any of your medications, you can contact the [facility] at [phone], or you can go to the [facility] at [facility] in [place]. You can also call the [facility] Link at [phone].     Please continue to follow-up with your outpatient care providers. Some of your follow-up appointments have been listed below. Please be sure to attend these appointments at the dates and times indicated, and to bring all of your medications with you to help your provider better guide your care.     Your discharge medications are listed here. Please continue to take these medications as directed.             Appointments which have been scheduled for you    Jun 19, 2017 12:00 PM EST  (Arrive by 11:45 AM)  RETURN PFT 15 with [facility] PFT 4  [facility] PULMONARY SPECIALTY FUNCT [facility] [place] [facility] REGION) [address]  [address]  [place] [address]  [phone]   Jun 19, 2017 12:30 PM EST  (Arrive by 12:15 PM)  RETURN CYSTIC FIBROSIS with [name], MD  [facility] PULMONARY SPECIALTY CL [facility] [place] [facility] REGION) [address]  [address]  [place] [address]  [phone]           I spent greater than 30 minutes in the discharge of this patient.

## 2017-06-05 NOTE — Unmapped (Signed)
Problem: Patient Care Overview  Goal: Plan of Care Review  Outcome: Progressing  Meds given per MAR. Pt given Benadryl prior to Zosyn. VSS. SpO2 WNL on RA. No s/sx of respiratory distress noted. Pt denies any sputum production. No c/o pain. Anticipate D/C 10/25. POC continues     Problem: Pain, Acute (Adult)  Goal: Acceptable Pain Control/Comfort Level  Patient will demonstrate the desired outcomes by discharge/transition of care.   Outcome: Progressing   06/05/17 0640   Pain, Acute (Adult)   Acceptable Pain Control/Comfort Level making progress toward outcome       Problem: Infection, Risk/Actual (Adult)  Goal: Identify Related Risk Factors and Signs and Symptoms  Related risk factors and signs and symptoms are identified upon initiation of Human Response Clinical Practice Guideline (CPG).   Outcome: Progressing   06/04/17 0505   Infection, Risk/Actual (Adult)   Related Risk Factors (Infection, Risk/Actual) chronic illness/condition     Goal: Infection Prevention/Resolution  Patient will demonstrate the desired outcomes by discharge/transition of care.   Outcome: Progressing   06/05/17 0640   Infection, Risk/Actual (Adult)   Infection Prevention/Resolution making progress toward outcome       Problem: Cystic Fibrosis (Adult)  Goal: Signs and Symptoms of Listed Potential Problems Will be Absent, Minimized or Managed (Cystic Fibrosis)  Signs and symptoms of listed potential problems will be absent, minimized or managed by discharge/transition of care (reference Cystic Fibrosis (Adult) CPG).   Outcome: Progressing   06/05/17 0640   Cystic Fibrosis (Adult)   Problems Assessed (Cystic Fibrosis) all   Problems Present (Cystic Fibrosis) none       Problem: VTE, DVT and PE (Adult)  Goal: Signs and Symptoms of Listed Potential Problems Will be Absent, Minimized or Managed (VTE, DVT and PE)  Signs and symptoms of listed potential problems will be absent, minimized or managed by discharge/transition of care (reference VTE, DVT and PE (Adult) CPG).   Outcome: Progressing   06/05/17 0640   VTE, DVT and PE (Adult)   Problems Assessed (VTE, DVT, PE) all   Problems Present (VTE, DVT, PE) none

## 2017-06-06 MED ORDER — LISINOPRIL 10 MG TABLET
ORAL_TABLET | Freq: Every day | ORAL | 0 refills | 0 days | Status: CP
Start: 2017-06-06 — End: 2017-09-03

## 2017-06-06 MED ORDER — TOBRAMYCIN IVPB IN 100 ML (STANDARD DOSING) (OVF)
INTRAVENOUS | 0 refills | 0 days | Status: CP
Start: 2017-06-06 — End: 2017-06-14

## 2017-06-06 NOTE — Unmapped (Addendum)
Pharmacy Transition of Care Note:    Date of admission to [facility]: 05/30/2017    Reason for Admission: Christian Young??is a 31 y.o.??male??with  CF s/p RUL lobectomy in 03/2017  that presented to [facility] with Cystic fibrosis with pulmonary exacerbation. He was initiated on zosyn and tobramycin IV for CF exacerbation and airway clearance along with his appropriate home medications while inpatient.     Discharge Date: 06/05/2017    Past Medical History:   Diagnosis Date   ??? Anxiety    ??? Chronic pain disorder    ??? Cystic fibrosis (CMS-HCC)    ??? Depression    ??? Hypertension      Antibiotics Course during this admission:   - Tobramycin 480 mg q24 hr initiated on 10/19  - Zosyn 4.5 g q6 hr initiated on 10/19  - Minocycline 100 mg BID initiated on 10/22  - Azithromycin 500 mg daily was initially started and switched to minocycline during inpatient stay.    Medication changes to be instituted upon discharge:  - STOP taking Azithromycin.Will resume upon f/u with primary pulmonologist.   - STOP taking inhaled amikacin. Will resume upon f/u with primary pulmonologist.   - STOP taking clofazimine. Will resume upon f/u with primary pulmonologist.   - CONTINUE tobramycin 480 mg q 24h + zosyn 4.5g q6h as an outpatient via home health. Planned end date for is 11/2, will f/u with primary pulmonologist if abx should be extended past 14 day planned duration.  - START Symdeko was filled at [facility] outpatient and given to the patient prior to discharge.   - START Minocycline. This was initiated upon discontinuation of inhaled amikacin and azithromycin; since patient was on IV aminoglycoside therapy, team did not want to continue inhaled treatment; minocycline was chosen to cover steno.  - All other home meds were continued upon discharge    Suggested monitoring for outpatient follow-up:   - During inpatient stay there was question about insurance coverage; verified active coverage prior to discharge.  - Labs were checked periodically while inpatient and WNL. Kidney function normal and stable with initiation of tobramycin IV.   - Monitor adherence to CF maintenance medications and airway clearance    Sharma Covert, PharmD  PGY1 Pharmacy Resident  Pager: [phone]

## 2017-06-06 NOTE — Unmapped (Signed)
Aminoglycoside Follow-Up Pharmacy Note     Christian Young is a 31 y.o. male on tobramycin 480 mg IV every 24 hours for CF exacerbation.      D1: 10/19  D14: 11/2     Current abx: zosyn 4.5 g q6h and tobramycin 480 mg q24h (1hr infusion)  IV access: Port     Goal peak: 12-14 mcg/mL  Goal trough: <0.6 mcg/mL     Last dose: 06:10 (1 hr infusion)     Measured peak 10/24 = 13.5 mcg/mL (~1.25 hr after end of infusion)  Calculated peak = 19.3 mcg/mL     Measured trough/random 10/24 = 2.8 mcg/mL (~16 hr before next dose)  Calculated trough = 0.028 mcg/mL  Est. Drug free interval: ~12.5 hr     Patient-Specific Pharmacokinetic Parameters:  Vd =~32   L, ke = 0.286 hr-1     Pertinent labs: BUN/SCr 17/0.94   K 4.5, Mg 1.9     Recommend at this time unless clinically indicated otherwise   1. Continued tobra 480 mg q24h (1h infusion); patient will continue outpatient IV therapy.    2. Recheck tobra levels in 2-3 days, sooner if renal fx/clinical status worsens   3. Continue to monitor bun/scr, K/Mg at least 2x/week   4. Minimize the use of NSAIDs while on IV tobra - incr risk for nephrotoxicity     Pharmacy will continue to monitor for changes in renal function, toxicity, and efficacy and order additional levels as needed. Please page service pharmacist with questions/clarifications.     Sharma Covert, PharmD  PGY1 Pharmacy Resident  Pager: [phone]

## 2017-06-10 NOTE — Unmapped (Signed)
Opened in error

## 2017-06-11 NOTE — Unmapped (Signed)
Called pt to f/u with home IV therapy. Pt reports he is feeling much better and feels that he will be ready to stop as scheduled 11/2. Pt did report onset of horrible headache yesterday when I woke up. Nothing worked. I took Tylenol and migraine medicine with caffeine and that didn't help. Pt stated he then tested his blood sugar and it was 320. Pt explained previously at [facility] he was diagnosed as glucose intolerant but has not had any blood sugar issues since being at [facility]. Pt reports his headache is better today. No other symptoms. Told patient I would notify his provider and dietician, Okey Dupre and will f/u with plan of care.  I called [facility] for labs and they have not been received yet. Confirmed f/u appointment for 11/8 w/ Dr. Koren Shiver.

## 2017-06-12 MED ORDER — PARI LC D NEBULIZER MISC
5 refills | 0 days | Status: CP
Start: 2017-06-12 — End: 2017-09-11

## 2017-06-12 MED ORDER — PARI LC SPRINT NEBULIZER SET
5 refills | 0 days
Start: 2017-06-12 — End: 2017-06-12

## 2017-06-12 NOTE — Unmapped (Signed)
I called the patient this morning to f/u on yesterdays conversation regarding his blood sugar. Patient stated that yesterday he had a blood sugar that was 220 and after he ate it was up to 300. Pt reports mild headache. Pt is to be seen for f/u with Dr. Koren Shiver 11/8. Will notify Dr. Koren Shiver of continued high sugars and if anything should be done prior to 11/8 visit.

## 2017-06-12 NOTE — Unmapped (Signed)
Patient does not need a refill of specialty medication at this time. Moving specialty refill reminder call to appropriate date and removed call attempt data.  SENDING PARI NEB CUPS ONLY.

## 2017-06-12 NOTE — Unmapped (Signed)
Called home infusion, still no labs. According to patient, labs drawn yesterday 10/30. He infused from [phone], stated lab came around 1430.

## 2017-06-12 NOTE — Unmapped (Signed)
Addended by: Alton Revere T on: 06/12/2017 03:06 PM     Modules accepted: Orders

## 2017-06-12 NOTE — Unmapped (Signed)
Entered mental health flowsheets.

## 2017-06-13 MED FILL — PARI LC SPRINT NEBULIZER/SPRINT/MISC: PARI LC SPRINT NEBULIZER/SPRINT/MISC | 90 days supply | Qty: 3 | Fill #0

## 2017-06-13 NOTE — Unmapped (Signed)
Pulmonary Clinic Note: Outpatient Lab Results from Tuesday, October 30th, 2018    Outpatient IV Antimicrobial Therapy:  Start Date: 05/31/17  End Date: 06/14/17 (D14)  Line: PORT  Referring pulmonologist: [name]    IV Antibiotics Ordered:  -Tobramycin 480mg  IV every 24 hours (CF hi-dose, infuse over 60 minutes)-Please dose in the MORNING  -Ceftazidime 2g IV every 8 hours       GENERAL LABS: see Care Everywhere     [facility]   Home  10/30 [facility]  10/24   BUN   22 17   SCR  1.08 0.94   MG 2.1 1.9     CBC    Home  10/30 Hosp  10/22   WBC 6.5 5.6   HGB  11.9 11.8   HCT 37.5 37.0   PLT  390 353       DRUG LEVELS:  Measured Drug Levels:   -Tobramycin Random: 1.9 mcg/mL (Collected Date/Time: 10/30 @ 1430)     ----------------------------------------------------------------------------------------------------------------------  Patient was called to discuss start and stop time of Tobramycin on date of drug level collection.    Patient Reported Administration Time:   Start of infusion: 0830   End of infusion: 0930     PLAN     1. Results note forwarded to: [name]  2. Drug levels forwarded to CPP Michel Santee, RN

## 2017-06-13 NOTE — Unmapped (Signed)
Outpatient Pharmacist Consult Note: Therapeutic Drug Level Monitoring    Christian Young is a 31 y.o. male on outpatient IV antibiotics for CF exacerbation.      Please see previous Documentation note by Norlene Duel, CF Nurse Coordinator on 06/13/17 for detailed lab results.    Outpatient IV Antimicrobial Therapy:  Start Date: 05/31/17  End Date: 06/14/17 (D14)    IV Antibiotics Ordered:  -Tobramycin 480mg  IV every 24 hours (CF hi-dose, infuse over 60 minutes)-Please dose in the MORNING  -Ceftazidime 2g IV every 8 hours     Patient Parameters   Patient weight:   Wt Readings from Last 1 Encounters:   06/04/17 (!) 120.6 kg (265 lb 14 oz)     Drug Administration Time: 1610-9604     Goal Drug Levels:   -Tobramycin (CF Hi-dose Extended Interval): Target end of Infusion peak: 20-30 mcg/mL; trough < 95mcg/mL; 12-hr random level < 2 mcg/mL     Measured Drug Levels:   -Tobramycin Random: 1.9 mcg/mL (06/11/17 1430-Drawn 5hr(s) after end of infusion)     Recommendations at this time unless clinically indicated otherwise:    -Current levels Therapeutic . Renal function stable and random level at time collected is appropriate.  -Continue Tobramycin 480mg  IV every 24 hours CF hi-dose extended interval (infuse over 1 hour).    Labs:  -No further follow up labs were ordered as patient due to complete day 14 of IV antibiotics on  06/14/17, will order labs if therapy extended     Provider F/U:  -Please remind patient to restart NTM treatment s/p exacerbation       [name], PharmD, BCPS, CPP  Clinical Pharmacist Practitioner  [facility] Adult Cystic Fibrosis [facility] / [facility]  Pager: [phone]    Consult note routed back to: [name] and Norlene Duel, RN (CF)

## 2017-06-18 ENCOUNTER — Emergency Department
Admission: EM | Admit: 2017-06-18 | Discharge: 2017-06-18 | Disposition: A | Discharge status: Home / Self Care | Payer: BC Managed Care – PPO | Source: Intra-hospital

## 2017-06-18 ENCOUNTER — Emergency Department
Admission: EM | Admit: 2017-06-18 | Discharge: 2017-06-18 | Disposition: A | Discharge status: Home / Self Care | Payer: BC Managed Care – PPO | Source: Intra-hospital | Attending: Emergency Medicine

## 2017-06-18 DIAGNOSIS — M7989 Other specified soft tissue disorders: Principal | ICD-10-CM

## 2017-06-18 DIAGNOSIS — M79602 Pain in left arm: Secondary | ICD-10-CM

## 2017-06-18 LAB — BASIC METABOLIC PANEL
ANION GAP: 16 mmol/L — ABNORMAL HIGH (ref 9–15)
BLOOD UREA NITROGEN: 19 mg/dL (ref 7–21)
CALCIUM: 9.3 mg/dL (ref 8.5–10.2)
CHLORIDE: 103 mmol/L (ref 98–107)
CO2: 21 mmol/L — ABNORMAL LOW (ref 22.0–30.0)
CREATININE: 0.83 mg/dL (ref 0.70–1.30)
EGFR MDRD AF AMER: >=60 mL/min/{1.73_m2} (ref >=60)
EGFR MDRD NON AF AMER: >=60 mL/min/{1.73_m2} (ref >=60)
GLUCOSE RANDOM: 158 mg/dL (ref 65–179)
POTASSIUM: 4.4 mmol/L (ref 3.5–5.0)
SODIUM: 140 mmol/L (ref 135–145)

## 2017-06-18 LAB — CBC W/ AUTO DIFF
BASOPHILS ABSOLUTE COUNT: 0.1 10*9/L (ref 0.0–0.1)
EOSINOPHILS ABSOLUTE COUNT: 0.3 10*9/L (ref 0.0–0.4)
HEMATOCRIT: 37.6 % — ABNORMAL LOW (ref 41.0–53.0)
HEMOGLOBIN: 12.1 g/dL — ABNORMAL LOW (ref 13.5–17.5)
LARGE UNSTAINED CELLS: 3 % (ref 0–4)
LYMPHOCYTES ABSOLUTE COUNT: 2.5 10*9/L (ref 1.5–5.0)
MEAN CORPUSCULAR HEMOGLOBIN: 23.9 pg — ABNORMAL LOW (ref 26.0–34.0)
MEAN CORPUSCULAR VOLUME: 74.4 fL — ABNORMAL LOW (ref 80.0–100.0)
MEAN PLATELET VOLUME: 8.5 fL (ref 7.0–10.0)
MONOCYTES ABSOLUTE COUNT: 0.5 10*9/L (ref 0.2–0.8)
NEUTROPHILS ABSOLUTE COUNT: 4.5 10*9/L (ref 2.0–7.5)
PLATELET COUNT: 333 10*9/L (ref 150–440)
RED BLOOD CELL COUNT: 5.05 10*12/L (ref 4.50–5.90)
RED CELL DISTRIBUTION WIDTH: 17.2 % — ABNORMAL HIGH (ref 12.0–15.0)
WBC ADJUSTED: 8.1 10*9/L (ref 4.5–11.0)

## 2017-06-18 LAB — HEMOGLOBIN: Lab: 12.1 — ABNORMAL LOW

## 2017-06-18 LAB — CREATINE KINASE TOTAL: Creatine kinase:CCnc:Pt:Ser/Plas:Qn:: 236

## 2017-06-18 LAB — BUN / CREAT RATIO: Urea nitrogen/Creatinine:MRto:Pt:Ser/Plas:Qn:: 23

## 2017-06-18 LAB — SMEAR REVIEW

## 2017-06-18 MED ORDER — OXYCODONE 5 MG TABLET
ORAL_TABLET | Freq: Four times a day (QID) | ORAL | 0 refills | 0.00000 days | Status: SS | PRN
Start: 2017-06-18 — End: 2017-08-19

## 2017-06-18 NOTE — Unmapped (Signed)
Patient returned voicemail left by [place] on Friday afternoon. He stated he was sorry he didn't return the voicemail earlier. He doesn't know how to answer the question that he is ready to end IV antibiotic therapy. Because he is feeling kind of crummy again. he isn't sure if it is lack of sleep or lack of rest. He doesn't know what to do or what is really best.     Returned [name]'s call today, Monday. Last dose on tobramycin and zosyn were on Saturday morning. Has been able to get more rest. Said the night before he had left the voicemail that he didn't get much rest (cough and being uncomfortable).  However, he is getting more rest, continuing with airway clearance (vest). Cough is okay, and has lessened at bit night.     Aware of pending appointment on Thursday. No other needs at this time.     [name], RN  CF Nurse Coordinator   [phone]

## 2017-06-18 NOTE — Unmapped (Signed)
Discussed with Dr. Koren Shiver. He would like Christian Young to present to the ER as he is working about clofazimine toxicity or possibly a blood clot. Spoke with [employer] Christian Young verbalizes understanding to present to the ER. Dr. Koren Shiver to write a note about his recommendations.

## 2017-06-18 NOTE — Unmapped (Signed)
Received a voicemail from patient on the CF nurse coordinator line stating he was having trouble with his left arm. Stated it was hurting really bad and is purple.     Patient called back to the [facility] nurse coordinator office. Noticed his arm was purple and starting to hurt really really bad around 0930 this morning when he was at the grocery store. Denies trauma or sleeping on it.  No recent phlebotomy or blood pressure taken in this arm.  Notes the swelling starts at his elbow and goes to fingers.  States the edema is non-pitting.  Arm hurts really badly. Purplish in color, describes it as a weird color (and spoltchy). Pain everywhere are his arm.  Able to feel pulse at wrist. Tingling sensation from elbow to hand.     [name] reports a a history of blood clots. In arms, one in throat and a couple in his legs.  On Xarelto.  Notes he does bruise easily on this medication.  Have asked for pictures to be take of arms and uploaded into MyChart.     [name], RN  CF Nurse Coordinator   [phone]

## 2017-06-19 NOTE — Unmapped (Signed)
Patient resting in stretcher in NAD with respirations even and unlabored. All needs and concerns managed. Patient rounding complete, call bell in reach, bed locked and in lowest position, and patient belongings at bedside within reach of patient.  Patient updated on plan of care. Will continue to monitor.

## 2017-06-19 NOTE — Unmapped (Signed)
Pt states L arm started swelling with redness an pain this AM - PCP concerned for clot.   NAD

## 2017-06-19 NOTE — Unmapped (Addendum)
[facility]  Emergency Department Provider Note      ED Clinical Impression     Final diagnoses:   Left arm swelling (Primary)       Initial Impression, ED Course, Assessment and Plan     Impression: Patient is a 31 -year-old male with a history of CF, hypertension, DVT on Xarelto who presents with left arm pain, swelling and discoloration. Has not missed Xarelto dose. Exam notable for mild swelling at elbow to hand with full range of motion, pulses present, blotchy discoloration. Will check ultrasound for clot.    8:44 PM PVL lab called - negative. Will check CK to rule out compartment syndrome. If negative, plan to discharge with PCP follow-up.   11:17 PM CK negative. Patient's pain controlled with oxycodone 5mg . Plan for discharge with 10 tablets.     Additional Medical Decision Making      I independently visualized the radiology images.   I reviewed the patient's prior medical records.   I discussed the case with the consultant, admitting provider, radiologist, ED pharmacist etc.     Any labs and radiology results that were available during my care of the patient were independently reviewed by me and considered in my medical decision making.    Portions of this record have been created using Scientist, clinical (histocompatibility and immunogenetics). Dictation errors have been sought, but may not have been identified and corrected.  ____________________________________________    I have reviewed the triage vital signs and the nursing notes.     History     Chief Complaint  Arm Swelling      HPI   Christian Young is a 31 y.o. male with a history of cystic fibrosis, hypertension, DVT on Xarelto who presents with left arm pain, swelling and discoloration. Started this morning from his elbow to his hand. Able to feel pulses and arm has full range of motion but is painful. Lifting his arm above his head helps to alleviate the pain. States his last blood count was at the beginning of this year. He states he has not missed a dose of Xarelto. No new medications or trauma. Called his pulmonologist who recommended he come to the ED for further evaluation.       Past Medical History:   Diagnosis Date   ??? Anxiety    ??? Chronic pain disorder    ??? Cystic fibrosis (CMS-HCC)    ??? Depression    ??? Hypertension        Patient Active Problem List   Diagnosis   ??? Cystic fibrosis with pulmonary exacerbation (CMS-HCC)   ??? Essential hypertension   ??? History of DVT (deep vein thrombosis)   ??? Depressive disorder, r/o major depression, recurrent, moderate/severe and dysthymic disorder   ??? Mood disorder (CMS-HCC)   ??? Anxiety   ??? Cystic fibrosis with intestinal manifestation (CMS-HCC)   ??? Chronic pansinusitis   ??? Pancreatic insufficiency due to cystic fibrosis (CMS-HCC)   ??? Mycobacterium abscessus infection   ??? Pseudomonas pneumonia (CMS-HCC)       Past Surgical History:   Procedure Laterality Date   ??? PR REMOVAL OF LUNG,LOBECTOMY Right 03/29/2017    Procedure: REMOVAL OF LUNG, OTHER THAN PNEUMONECTOMY; SINGLE LOBE (LOBECTOMY);  Surgeon: Cherie Dark, MD;  Location: MAIN OR [facility];  Service: Thoracic       No current facility-administered medications for this encounter.     Current Outpatient Prescriptions:   ???  albuterol (PROVENTIL HFA;VENTOLIN HFA) 90 mcg/actuation inhaler, Inhale  2 puffs every six (6) hours as needed., Disp: 1 Inhaler, Rfl: 1  ???  amikacin (AMIKIN) 500 mg/2 mL injection, Do NOT RESUME until discussed with Primary Pulmonologist.   (Withdraw 2 mL (500mg ) and add to neb cup. Administer using nebulizer BID., Disp: 60 vial, Rfl: 0  ???  azithromycin (ZITHROMAX) 500 MG tablet, DO NOT RESTART until discussed with primary pulmonologist (500mg  daily), Disp: 30 tablet, Rfl: 1  ???  budesonide-formoterol (SYMBICORT) 160-4.5 mcg/actuation inhaler, Inhale 2 puffs Two (2) times a day., Disp: , Rfl:   ???  carvedilol (COREG) 3.125 MG tablet, Take 1 tablet (3.125 mg total) by mouth Two (2) times a day., Disp: 60 tablet, Rfl: 1  ???  cetirizine (ZYRTEC) 10 MG tablet, Take 10 mg by mouth daily., Disp: , Rfl:   ???  cholecalciferol, vitamin D3, 1,000 unit tablet, Take 1 tablet (1,000 Units total) by mouth daily., Disp: 30 tablet, Rfl: 11  ???  CLOFAZIMINE ORAL, Take 200 mg by mouth daily. , Disp: , Rfl:   ???  dornase alfa (PULMOZYME) 1 mg/mL nebulizer solution, Inhale 2.5 mg daily., Disp: 225 mL, Rfl: 3  ???  fluticasone (FLONASE) 50 mcg/actuation nasal spray, 2 sprays by Each Nare route daily., Disp: 16 g, Rfl: 0  ???  lamoTRIgine (LAMICTAL) 100 MG tablet, Take 100 mg by mouth daily., Disp: , Rfl:   ???  lipase-protease-amylase (CREON) 36,000-114,000- 180,000 unit CpDR, Take 7-8 capsules with meals and 5-6 capsules with snacks, Disp: 600 capsule, Rfl: 0  ???  lisinopril (PRINIVIL,ZESTRIL) 10 MG tablet, Take 1 tablet (10 mg total) by mouth daily., Disp: 90 tablet, Rfl: 0  ???  magnesium oxide (MAG-OX) 400 mg tablet, Take 2 tablets (800 mg total) by mouth Two (2) times a day., Disp: 120 tablet, Rfl: 11  ???  melatonin 3 mg Tab, Take 2 tablets (6 mg total) by mouth nightly., Disp: , Rfl: 0  ???  montelukast (SINGULAIR) 10 mg tablet, Take 1 tablet (10 mg total) by mouth nightly., Disp: 30 tablet, Rfl: 1  ???  MVW Complete, pediatric multivit 61-D3-vit K, 1,500-800 unit-mcg cap, Take 1 capsule by mouth Two (2) times a day., Disp: 30 capsule, Rfl: 0  ???  omeprazole (PRILOSEC) 20 MG capsule, Take 20 mg by mouth daily., Disp: , Rfl:   ???  oxyCODONE (ROXICODONE) 5 MG immediate release tablet, Take 1 tablet (5 mg total) by mouth every six (6) hours as needed for pain., Disp: 10 tablet, Rfl: 0  ???  PARI LC D NEBULIZER Misc, Provide 1 device  for use with inhaled medication., Disp: 3 each, Rfl: 5  ???  pramipexole (MIRAPEX) 0.125 MG tablet, Take 0.25 mg by mouth daily. , Disp: , Rfl:   ???  pregabalin (LYRICA) 100 MG capsule, Take 300 mg by mouth Two (2) times a day. , Disp: , Rfl:   ???  rivaroxaban (XARELTO) 20 mg tablet, Take 20 mg by mouth daily with evening meal., Disp: , Rfl:   ???  sodium bicarb-sodium chloride 700-2,300 mg Pack, Irrigate each nostril with 120 mL of solution (1 packet mixed with 240 mL of distilled water) BID, Disp: , Rfl:   ???  sodium chloride (NS) Soln, Add 1mL NaCl into neb cup along with amikacin., Disp: 600 mL, Rfl: 11  ???  sodium chloride 7% 7 % Nebu, Inhale 4 mL by nebulization Two (2) times a day., Disp: 720 mL, Rfl: 3  ???  syringe with needle (SYRINGE 3CC/20GX1) 3 mL 20 gauge x 1  Syrg, For use with inhaled antibiotic, Disp: 60 Syringe, Rfl: 11  ???  tezacaftor 100mg /ivacaftor 150mg  and ivacaftor 150mg  (SYMDEKO) tablets, Take 1 tablet (tezacaftor 100mg /ivacaftor 150mg ) by mouth every AM and 1 tablet (ivacaftor 150mg ) every PM with fatty food, Disp: 56 tablet, Rfl: 3  ???  vilazodone HCl (VIIBRYD ORAL), Take 40 mg by mouth daily. , Disp: , Rfl:     Allergies  [place] [aztreonam lysine]; Cefepime; and Tobramycin    Family History   Problem Relation Age of Onset   ??? Bipolar disorder Mother    ??? Depression Mother        Social History  Social History   Substance Use Topics   ??? Smoking status: Never Smoker   ??? Smokeless tobacco: Never Used   ??? Alcohol use No       Review of Systems  Constitutional: Negative for fever.  Cardiovascular: Negative for chest pain.  Respiratory: Negative for shortness of breath.  Gastrointestinal: Negative for abdominal pain, vomiting or diarrhea.  Skin: Negative for rash.  All other systems have been reviewed and are negative except as otherwise documented.  Physical Exam     ED Triage Vitals [06/18/17 1925]   Enc Vitals Group      BP 155/90      Heart Rate 103      SpO2 Pulse 97      Resp 16      Temp 36.8 ??C (98.2 ??F)      Temp src       SpO2 98 %      Weight       Height       Head Circumference       Peak Flow       Pain Score       Pain Loc       Pain Edu?       Excl. in GC?        Constitutional: Alert and oriented. Well appearing and in no distress.  Eyes: Conjunctivae are normal.  ENT       Head: Normocephalic and atraumatic.       Nose: No congestion.       Mouth/Throat: Mucous membranes are moist.  Cardiovascular: Normal rate, regular rhythm. Normal and symmetric distal pulses are present in all extremities.  Respiratory: Normal respiratory effort. Mild inspiratory crackles on right mid to base.  Gastrointestinal: Soft and nontender.   Musculoskeletal: Normal range of motion in all extremities.        RUE No tenderness or edema.        LUE: mild edema posterior elbow to hand with full ROM and pain, no warmth or marked erythema, no skin breaks, blotchy appearance, radial pulse present       Right lower leg: No tenderness or edema.       Left lower leg: No tenderness or edema.  Neurologic: Normal speech and language. No gross focal neurologic deficits are appreciated.  Skin: Skin is warm, dry and intact. LUE blotchy.  Psychiatric: Mood and affect are normal. Speech and behavior are normal.      EKG         Radiology     PVL left arm negative    Procedures     Procedure(s) performed: None.             Jeanine Luz, DO  Resident  06/18/17 [id number]       Jeanine Luz, DO  Resident  06/18/17 [phone]

## 2017-06-19 NOTE — Unmapped (Signed)
Patient resting in stretcher in NAD with respirations even and unlabored on phone with family at bedside. All needs and concerns managed. Patient rounding complete, call bell in reach, bed locked and in lowest position, and patient belongings at bedside within reach of patient.  Patient updated on plan of care. Will continue to monitor.

## 2017-06-19 NOTE — Unmapped (Signed)
Patient states is ready for discharge. All questions and concerns addressed. Family at beside. Patient ambulatory with balanced and steady gait. Patient appears in NAD with respirations even and unlabored. Patient states all belongings present.

## 2017-06-19 NOTE — Unmapped (Signed)
Patient called out with call bell, this RN responded. Requesting plan of care update. Patient informed that MD is putting in discharge orders at this time. Patient also requesting apple juice, apple juice provided to patient. No other needs expressed at this time. Patient in stretcher with resp even and unlabored. Bed low, locked, call bell remains in reach.

## 2017-06-19 NOTE — Unmapped (Signed)
Patient contacted nurse coordinator for swollen painful, discolored (although pictures less impressive than description) arm. As clofazimine is known to caause thromboembolism, I have sent him to the ED. HE requires an arterial and venous vascular ultrasound of the arm, and if this is negative and skin discoloration continues, would suggest a dermatology consultation.    Thurnell Garbe, PGY6  Pulmonary critical care fellow  [phone]

## 2017-06-19 NOTE — Unmapped (Signed)
Patient transported to PVL  Transported by Radiology  How tranported Wheelchair  Cardiac Monitor no

## 2017-06-20 ENCOUNTER — Ambulatory Visit
Admission: RE | Admit: 2017-06-20 | Discharge: 2017-06-20 | Disposition: A | Discharge status: Home / Self Care | Payer: BC Managed Care – PPO | Attending: Pulmonary Disease | Admitting: Pulmonary Disease

## 2017-06-20 ENCOUNTER — Ambulatory Visit
Admission: RE | Admit: 2017-06-20 | Discharge: 2017-06-20 | Disposition: A | Discharge status: Home / Self Care | Payer: BC Managed Care – PPO

## 2017-06-20 DIAGNOSIS — R300 Dysuria: Secondary | ICD-10-CM

## 2017-06-20 DIAGNOSIS — J986 Disorders of diaphragm: Secondary | ICD-10-CM

## 2017-06-20 LAB — COMPREHENSIVE METABOLIC PANEL
ALBUMIN: 4.4 g/dL (ref 3.5–5.0)
ALKALINE PHOSPHATASE: 98 U/L (ref 38–126)
ANION GAP: 13 mmol/L (ref 9–15)
AST (SGOT): 53 U/L (ref 19–55)
BILIRUBIN TOTAL: 0.5 mg/dL (ref 0.0–1.2)
BLOOD UREA NITROGEN: 17 mg/dL (ref 7–21)
BUN / CREAT RATIO: 17
CALCIUM: 9.6 mg/dL (ref 8.5–10.2)
CHLORIDE: 102 mmol/L (ref 98–107)
CO2: 24 mmol/L (ref 22.0–30.0)
EGFR MDRD AF AMER: >=60 mL/min/{1.73_m2} (ref >=60)
EGFR MDRD NON AF AMER: >=60 mL/min/{1.73_m2} (ref >=60)
GLUCOSE RANDOM: 103 mg/dL (ref 65–179)
POTASSIUM: 4.4 mmol/L (ref 3.5–5.0)
PROTEIN TOTAL: 7.7 g/dL (ref 6.5–8.3)
SODIUM: 139 mmol/L (ref 135–145)

## 2017-06-20 LAB — CBC W/ AUTO DIFF
BASOPHILS ABSOLUTE COUNT: 0.1 10*9/L (ref 0.0–0.1)
EOSINOPHILS ABSOLUTE COUNT: 0.2 10*9/L (ref 0.0–0.4)
HEMATOCRIT: 39.2 % — ABNORMAL LOW (ref 41.0–53.0)
HEMOGLOBIN: 12.2 g/dL — ABNORMAL LOW (ref 13.5–17.5)
LARGE UNSTAINED CELLS: 4 % (ref 0–4)
LYMPHOCYTES ABSOLUTE COUNT: 2.4 10*9/L (ref 1.5–5.0)
MEAN CORPUSCULAR HEMOGLOBIN CONC: 31.2 g/dL (ref 31.0–37.0)
MEAN CORPUSCULAR HEMOGLOBIN: 23.1 pg — ABNORMAL LOW (ref 26.0–34.0)
MEAN CORPUSCULAR VOLUME: 74.1 fL — ABNORMAL LOW (ref 80.0–100.0)
MEAN PLATELET VOLUME: 9.2 fL (ref 7.0–10.0)
MONOCYTES ABSOLUTE COUNT: 0.6 10*9/L (ref 0.2–0.8)
PLATELET COUNT: 414 10*9/L (ref 150–440)
RED BLOOD CELL COUNT: 5.29 10*12/L (ref 4.50–5.90)
RED CELL DISTRIBUTION WIDTH: 17.3 % — ABNORMAL HIGH (ref 12.0–15.0)

## 2017-06-20 LAB — PROTEIN / CREATININE RATIO, URINE: PROTEIN URINE: 8.6 mg/dL

## 2017-06-20 LAB — ALT (SGPT): Alanine aminotransferase:CCnc:Pt:Ser/Plas:Qn:: 75 — ABNORMAL HIGH

## 2017-06-20 LAB — EOSINOPHILS ABSOLUTE COUNT: Lab: 0.2

## 2017-06-20 LAB — CREATININE, URINE: Lab: 189.2

## 2017-06-21 NOTE — Unmapped (Signed)
Per request of ongoing SW, Serita Grit, LCSW, covering SW today checked in with pt and PCG (primary caregiver) wife in clinic exam room to follow-up on needed resources and supports d/t pt's CF status - particularly food insecurity issues.  PCG wife stated, It's been rough- and still is - even with his dad's helping [name] out some - especially since I had to drop my hours from about 55/week to 35/week with his surgery.  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  Discussion today centered around how to best deal with food insecurity during this remaining time until wife can obtain a new job.      After consultation with CF SW colleague, Mercy Moore, LCSW, SW delivered monthly $50 food gift card from the [facility] [employer] as well as a $25 gas card from the [facility] [employer]  Assisted couple with in-clinic food resources as well.      Ascertained that pt has utilized [employer] for obtaining his beloved service dog, Mia, who was present in clinic exam room with him today - couple indicated much gratitude for this.  Explored their willingness for SW to apply for [employer] for additional food insecurity resources after learning pt has not utilized this resource yet this calendar year.  Pt reported that he has been pleased with the [employer] which is currently assisting him with applying for Social Security disability benefits.     Couple expressed gratitude for SW's visit today. They will reach out to clinic for SW follow-up if they have not heard from the [facility] within a few weeks.  Will ensure ongoing SW aware of all above.

## 2017-06-21 NOTE — Unmapped (Signed)
Sputum sample collected, labeled and taken to the lab.  [facility] LPN

## 2017-06-25 NOTE — Unmapped (Signed)
Per pt's request at clinic visit late last week, covering SW for ongoing CF SW, Serita Grit, LCSW, today composed referral letter to the [employer] asking them for additional food resources for pt.given family's current financial stressors.  Scanned and emailed letter to the foundation.  Will place copy in ongoing SW's office and will await response from foundation.

## 2017-06-26 MED ORDER — TEZACAFTOR 100 MG-IVACAFTOR 150 MG(DAY)/IVACAFTOR 150 MG(NITE) TABLETS
3 refills | 0 days
Start: 2017-06-26 — End: 2017-06-26

## 2017-06-26 MED ORDER — SYMDEKO 100 MG-150 MG (DAY)/150 MG (NIGHT) TABLETS
ORAL_TABLET | 3 refills | 0 days | Status: CP
Start: 2017-06-26 — End: 2017-10-24

## 2017-06-26 NOTE — Unmapped (Signed)
Medication Refill

## 2017-06-26 NOTE — Unmapped (Signed)
Pulmonary Cystic Fibrosis New Clinic Note      HISTORY:     Interval History:  Diagnosed at 29 with chronic infections, had consolidation and parapneumonic effusion,sent to [facility] for treatment since then.   [id number] and [id number] insertion. 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    - Multiple hospital admissions, including lobectomy 03/29/2017   - No current constitutional symptoms, off MABSC sx due to being on IV tobra, will need to go back on MABSC pending renal function  - Constitutional sx have been far less common after lobectomy  - Does have some breathlessness when he lie flat since his surgery  - No current chest symoptoms  - He is using his HS with fair compliance  - Pulmozyme with fair compliance as well  - More compliant with clearance and he is more aware now of how important this is  - On teza/iva with normal  LFTs   - He states his stools are fine to me  - Patient believes he has a positive fecal fat in the past  - Eating fast food frequently, pt had a discussion with nutrition re: this  - In the process of ordering new vest  - Not on insulin -- but soon to see endo for frequently elevated glucose on monitoring  - Increased exercise since our last discussion now 3-4x/wk in gym for 30-45 mins with cardio  - No issue with sinuses  - Looking for a new therapist, was discharged from last due to missing appointments because he was frequently hospitalized  - Recent swollen elbow with some skin discoloration, shooting pain, venous dopplers negative, arterial not performed despite my request    Past Medical History:   Diagnosis Date   ??? Anxiety    ??? Chronic pain disorder    ??? Cystic fibrosis (CMS-HCC)    ??? Depression    ??? Hypertension      Past Surgical History:   Procedure Laterality Date   ??? PR REMOVAL OF LUNG,LOBECTOMY Right 03/29/2017    Procedure: REMOVAL OF LUNG, OTHER THAN PNEUMONECTOMY; SINGLE LOBE (LOBECTOMY);  Surgeon: Cherie Dark, MD;  Location: MAIN OR [facility]; Service: Thoracic       Other History:  The social history and family history were personally reviewed and updated in the patient's electronic medical record.    Home Medications:  Current Outpatient Prescriptions on File Prior to Visit   Medication Sig Dispense Refill   ??? albuterol (PROVENTIL HFA;VENTOLIN HFA) 90 mcg/actuation inhaler Inhale 2 puffs every six (6) hours as needed. 1 Inhaler 1   ??? amikacin (AMIKIN) 500 mg/2 mL injection Do NOT RESUME until discussed with Primary Pulmonologist.   (Withdraw 2 mL (500mg ) and add to neb cup. Administer using nebulizer BID. 60 vial 0   ??? azithromycin (ZITHROMAX) 500 MG tablet DO NOT RESTART until discussed with primary pulmonologist (500mg  daily) 30 tablet 1   ??? budesonide-formoterol (SYMBICORT) 160-4.5 mcg/actuation inhaler Inhale 2 puffs Two (2) times a day.     ??? carvedilol (COREG) 3.125 MG tablet Take 1 tablet (3.125 mg total) by mouth Two (2) times a day. 60 tablet 1   ??? cetirizine (ZYRTEC) 10 MG tablet Take 10 mg by mouth daily.     ??? cholecalciferol, vitamin D3, 1,000 unit tablet Take 1 tablet (1,000 Units total) by mouth daily. 30 tablet 11   ??? CLOFAZIMINE ORAL Take 200 mg by mouth daily.      ??? dornase alfa (PULMOZYME) 1 mg/mL  nebulizer solution Inhale 2.5 mg daily. 225 mL 3   ??? fluticasone (FLONASE) 50 mcg/actuation nasal spray 2 sprays by Each Nare route daily. 16 g 0   ??? lamoTRIgine (LAMICTAL) 100 MG tablet Take 100 mg by mouth daily.     ??? lipase-protease-amylase (CREON) 36,000-114,000- 180,000 unit CpDR Take 7-8 capsules with meals and 5-6 capsules with snacks 600 capsule 0   ??? lisinopril (PRINIVIL,ZESTRIL) 10 MG tablet Take 1 tablet (10 mg total) by mouth daily. 90 tablet 0   ??? melatonin 3 mg Tab Take 2 tablets (6 mg total) by mouth nightly.  0   ??? montelukast (SINGULAIR) 10 mg tablet Take 1 tablet (10 mg total) by mouth nightly. 30 tablet 1   ??? MVW Complete, pediatric multivit 61-D3-vit K, 1,500-800 unit-mcg cap Take 1 capsule by mouth Two (2) times a day. 30 capsule 0   ??? omeprazole (PRILOSEC) 20 MG capsule Take 20 mg by mouth daily.     ??? oxyCODONE (ROXICODONE) 5 MG immediate release tablet Take 1 tablet (5 mg total) by mouth every six (6) hours as needed for pain. 10 tablet 0   ??? PARI LC D NEBULIZER Misc Provide 1 device  for use with inhaled medication. 3 each 5   ??? pramipexole (MIRAPEX) 0.125 MG tablet Take 0.25 mg by mouth daily.      ??? pregabalin (LYRICA) 300 MG capsule Take 300 mg by mouth Two (2) times a day.      ??? rivaroxaban (XARELTO) 20 mg tablet Take 20 mg by mouth daily with evening meal.     ??? sodium bicarb-sodium chloride 700-2,300 mg Pack Irrigate each nostril with 120 mL of solution (1 packet mixed with 240 mL of distilled water) BID     ??? sodium chloride (NS) Soln Add 1mL NaCl into neb cup along with amikacin. 600 mL 11   ??? sodium chloride 7% 7 % Nebu Inhale 4 mL by nebulization Two (2) times a day. 720 mL 3   ??? syringe with needle (SYRINGE 3CC/20GX1) 3 mL 20 gauge x 1 Syrg For use with inhaled antibiotic 60 Syringe 11   ??? tezacaftor 100mg /ivacaftor 150mg  and ivacaftor 150mg  (SYMDEKO) tablets Take 1 tablet (tezacaftor 100mg /ivacaftor 150mg ) by mouth every AM and 1 tablet (ivacaftor 150mg ) every PM with fatty food 56 tablet 3   ??? vilazodone HCl (VIIBRYD ORAL) Take 40 mg by mouth daily.      ??? magnesium oxide (MAG-OX) 400 mg tablet Take 2 tablets (800 mg total) by mouth Two (2) times a day. 120 tablet 11     No current facility-administered medications on file prior to visit.        Allergies:  Allergies as of 06/20/2017 - Reviewed 06/20/2017   Allergen Reaction Noted   ??? [place] [aztreonam lysine] Anaphylaxis 12/27/2016   ??? Cefepime Itching and Nausea Only 09/06/2015   ??? Slo-bid 100 Anaphylaxis 06/20/2017   ??? Banana Itching and Nausea And Vomiting 07/29/2016   ??? Tobramycin Tinnitus 09/06/2015       Review of Systems:  Ten systems were reviewed and were negative except as indicated in the above history.    CF Overview:     Genetics:  [id number] and [id number] insertion    Airway Clearance Regimen:  HS BID  Dornase    Inhaled ABX:  None    Hemoptysis: No  ABPA:  No  Ptx:  No  Sinusitus: No    Panc Insuf: Yes  PEG:  No  DIOS:  HAs  bowel issues  CF Liver Dz: No    Diabetes: Borderline  Osteopenia: N/A    Depression: Yes    Other Co-morbidities:  N/A    Social Setting:  Lives with wife near [place]  Remarkably depressed    CF Monitoring:     Pulmonary Function History:  Date: FVC (% Pred) FEV1 (% Pred) FEV1/FVC FEF25-75(% Pred) ABX / Tx   11/19/2016 4.36 (75.3) 3.30 (69.8) 76 2.74 (57.9)    11/26/2016 4.41 (76.1) 3.25 (68.7) 74 2.31 (48.9)    12/24/2016 4.66 (80.6) 3.45 (73.1) 74 2.57 (54.3)    02/21/2017 5.16 (88.1) 3.54 (74.2) 69 1.95 (41.0)    06/2017 3.77 (65.3) 2.73 (58.1) 75.5 1.67 (35.7)        Micro History:    MABSC  - Assumed S to erythro from [employer]    Smooth PsA  S: Cipro, Levo, Tobra  I: Aztreo, Cefe, Ceftaz, PT, LVX    Stenotrophamonas  R: Ceftat    MABSC (macrolide sensitive)  Last Negative Cx: 03/2017  Last Positive: 02/21/17      Exacerbation History:  Frequent from 2017 into 2018    BMI:   Body mass index is 36.97 kg/m??.    Last Sputum Cx: 12/2016  Last AFB:  12/2016    DEXA Scan:  Due  Result: --    Last IgE:  12/2016  Result: normal  Last LFT panel: 12/2016  Result: normal  Last Vit A/D/E: Due  Result: Due  Last INR:  12/2016  Result: normal  Last HbA1c:  6.1  Result: normal  Last OGTT:  Due  Result: Due      PHYSICAL EXAM:   BP 134/89  - Pulse 103  - Resp 18  - Ht 182 cm (5' 11.65)  - Wt (!) 122.5 kg (270 lb)  - SpO2 97%  - BMI 36.97 kg/m??   A/Ox3, flat affect  NCAT  EOMI, PER  Neck is supple without masses, No JVP  Membranes moist and no oral lesions  S1/S2, rrr, no m/r/g  Lungs CTA B/L over posterior lung fields and apices  Abd soft, NT, BS+  No visible skin breakdown, rashes, or lesions  Moving all four extemities spontaneously, no tremor with movement, no ataxia or dysmetria  No pain on palpation of joint or tendons of left elbow  No LE edema B/L  No noted anxiety or depression      LABORATORY and RADIOLOGY DATA:     Pertinent Laboratory Data:  As above    Pertinent Imaging Data:  Elevate right diaphragm on recent chest imaging, seems to persist over several images    ASSESSMENT and PLAN     Christian Young is a 31 y.o. 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    Currently asymptomatic. Drop in PFT's is most likely due to elevated right hemidiaphragm, potentially due to phrenic nerve impaction. This explains his dyspnea lying flat. Were going to get lying and upright PFTs but patient cancelled original appt and could not be rescheduled. We will have to follow this, but I feel this may be a new baseline for him. Will order sniff test to further assess.     As per previous discussions, patient has improved his self-care, including more frequent HS and dornase, as well as exercise. Unfortuantely discharged from his last psych for missing too many appointments. He is looking to establish care at another psych provider.    For the patient's Mycobacterium abscesses lung disease.  Negative culture in 03/2017 and negative smear from this visit. The patient received 6 weeks of intravenous amikacin and imipenem in January 2018 into February 2019, and is on oral azithromycin, clofazimine, and inhaled amikacin. This were stopped momentarily due to concern for being on both inhaled amikacin and IV tobramycin, have rechecked cr which was at his baseline and have contacted patin2eer and asked him continue his MABSC triple therapy.    For his joint and arm pain. Initial concern was thrombotic event, he has had 2 separate occasions of blood clotting and it on rivaroxiban. However, there is a clotting risk with clofazimine, so wished to check v and a dopplers and only venous were performed. Also on differential of oligoarthritis, burning with urination, and potential foot lesion is reactive arthritis. Will check urine gono and chlamydia naa    For the patient's cystic fibrosis related sinus disease, currently having no sinus issues, on LTi.    The patient is on the border of having cystic fibrosis related diabetes, to see endo in the near future.    Needs Dexa Scan    Vitamin levels at next blood draw    Teza/iva LFT monitoring on schedule    The patient was seen with Dr. Lurena Nida.    Thurnell Garbe, PGY6  Pulmonary critical care fellow  [phone]    June 26, 2017

## 2017-06-26 NOTE — Unmapped (Signed)
Katy from [employer] quickly responded back via email that they were mailing pt a $125 [employer] giftcard.  VM left for pt/PCG (primary caregiver) wife as to the above.  Will ensure ongoing SW aware of all above as well.

## 2017-06-26 NOTE — Unmapped (Unsigned)
[facility] Refill and Clinical Coordination Note  Medication(s): Symdeko 100-150MG , Creon 36000    Christian Young, DOB: 1986-01-03  Phone: [phone] (home) , Alternate phone contact: N/A  Shipping address: [address]  [place] [address]  Phone or address changes today?: No  All above HIPAA information verified.  Insurance changes? No    Completed refill and clinical call assessment today to schedule patient's medication shipment from the [facility] [phone]).      MEDICATION RECONCILIATION    Confirmed the medication and dosage are correct and have not changed: Yes, regimen is correct and unchanged.    Were there any changes to your medication(s) in the past month:  No, there are no changes reported at this time.    ADHERENCE    Is this medicine transplant or covered by Medicare Part B? No.    Did you miss any doses in the past 4 weeks? No missed doses reported.  Adherence counseling provided? Not needed     SIDE EFFECT MANAGEMENT    Are you tolerating your medication?:  Christian Young reports tolerating the medication.  Side effect management discussed: None      Therapy is appropriate and should be continued.      FINANCIAL/SHIPPING    Delivery Scheduled: Yes, Expected medication delivery date: 07/02/17   Additional medications refilled: No additional medications/refills needed at this time.    Jernard did not have any additional questions at this time.    Delivery address validated in [facility] scheduling system: Yes, address listed above is correct.      We will follow up with patient monthly for standard refill processing and delivery.      Thank you,  Lupita Shutter   [facility] Specialty Pharmacist

## 2017-06-26 NOTE — Unmapped (Signed)
Refill requested.  [facility] LPN

## 2017-06-27 MED ORDER — LIPASE-PROTEASE-AMYLASE 36,000-114,000-180,000 UNIT CAPSULE,DELAY REL
ORAL_CAPSULE | ORAL | 11 refills | 0.00000 days | Status: CP
Start: 2017-06-27 — End: 2017-06-27

## 2017-06-27 MED ORDER — LIPASE-PROTEASE-AMYLASE 36,000-114,000-180,000 UNIT CAPSULE,DELAY REL: capsule | 11 refills | 0 days | Status: AC

## 2017-06-27 NOTE — Unmapped (Signed)
Renewed Creon to [facility].    [name], PharmD, BCPS, CPP  Clinical Pharmacist Practitioner  [facility] / [facility]  Pager: [phone]

## 2017-06-27 NOTE — Unmapped (Signed)
I saw and evaluated the patient, participating in the key portions of the service.  I reviewed the resident???s note.  I agree with the resident???s findings and plan. Nicandro Perrault H Emari Demmer, MD

## 2017-06-27 NOTE — Unmapped (Signed)
As covering SW received email late yesterday afternoon from [place] with [employer] asking if pt would be ok with an emailed giftcard from [employer] (as they had inadvertently run out of hard copy giftcards), SW today attempted tc's to both pt [phone]) and PCG wife, Alyson, (primary caregiver)([phone]); yet, both numbers stated being unavailable.  Sent email to address shown in EPIC as well:  [email or web address].  Will await response with permission prior to communicating back to [facility] with [employer].    Attempted tc again with pt this afternoon which he answered - giving permission for SW to send his email address to [place] with [employer].  Stated gratitude as this support is coming at just the right time. SW emailed [place] with pt's email address as well as gratitude for [employer] giftcard.

## 2017-06-27 NOTE — Unmapped (Signed)
Forwarding refill for Creon to original prescriber.  Patient unknown to [facility] clinic. PCP not listed in patient chart, so unable to forward to PCP.    Maryjo Rochester, PharmD, BCPS, BCACP, CPP  Clinical Pharmacist   [place] [facility] ([facility])  Direct Line: [phone]  Clinic Line: [phone]  Fax: [phone]

## 2017-06-29 MED FILL — SYMDEKO/100-150/TBPK: SYMDEKO/100-150/TBPK | 28 days supply | Qty: 56 | Fill #0

## 2017-06-29 MED FILL — CREON/36000 UNITS/CPEP: CREON/36000 UNITS/CPEP | 16 days supply | Qty: 600 | Fill #0

## 2017-07-01 NOTE — Unmapped (Signed)
Irving Burton, can you take care of this one under Bobby's name?

## 2017-07-08 MED ORDER — CIPROFLOXACIN 750 MG TABLET
ORAL_TABLET | Freq: Two times a day (BID) | ORAL | 0 refills | 0 days | Status: CP
Start: 2017-07-08 — End: 2017-07-29

## 2017-07-08 MED ORDER — MINOCYCLINE 100 MG TABLET
ORAL_TABLET | Freq: Two times a day (BID) | ORAL | 0 refills | 0 days | Status: CP
Start: 2017-07-08 — End: 2017-07-29

## 2017-07-08 NOTE — Unmapped (Signed)
Patient called coordinator for seeming bacterial sinusitis, will prescribe 21 days of minocycline and cipro directed at his PsA and stenotrophamonas.  I do not think purulent nasal drainage due to MABSC.    Thurnell Garbe, PGY6  Pulmonary critical care fellow  [phone]

## 2017-07-08 NOTE — Unmapped (Addendum)
Patient left message on CF nurse line reporting new onset of symptoms. I called the patient back, left a voicemail.Marland Kitchen

## 2017-07-08 NOTE — Unmapped (Signed)
Spoke with patient on the phone. He reports onset of symptoms of a nasty cold that I can't seem to shake x two weeks ago around 06/24/17. He states it started with a sore throat and he went to see his PCP and was prescribed Levaquin x14 days. [name] said he started to feel better and then began to have a lot of nasal congestion that is now green and nasty. Pt reports no fever, chills, night sweats. No chest pain, no SOB. No increase in cough, no sputum production. No hemoptysis. Patient in on inhaled amikacin. He reports being able to do all of my regular airway clearance and I don't think I need to come into the hospital. Pt is requesting oral antibiotics. If prescribed, pt requests they [facility]t [facility] on [address]

## 2017-07-09 LAB — TOTAL IGE: Lab: 32.8

## 2017-07-15 MED FILL — CARVEDILOL/3.125MG/TABS: CARVEDILOL/3.125MG/TABS | 30 days supply | Qty: 60 | Fill #0

## 2017-07-15 NOTE — Unmapped (Signed)
[facility]  Adult Cystic Fibrosis Clinic    As part of [facility] [employer], [place] mailed patient [phone] gift card for food due to financial difficulties and associated food insecurity.        Mercy Moore, LCSW

## 2017-07-15 NOTE — Unmapped (Signed)
Faxed back [employer] Prescription Form  to [employer].

## 2017-07-24 NOTE — Unmapped (Signed)
[facility] Refill Coordination Note: Pulmonary         Christian Young, DOB: [birth year]    Medication Adherence    Patient Reported X Missed Doses in the Last Month:  0  Specialty Medication:  [facility] QOH ABOUT 2 WEEKS   Patient is on additional specialty medications:  Yes  Additional specialty medications:  CREON 36,000  Patient Reported Additional Medication X Missed Doses in the Last Month:  0  Patient is on more than two specialty medications:  No  Informant:  patient  Support Network for Adherence:  family member  Confirmed Plan for Next Specialty Medication Refill:  delivery by pharmacy  Refills Needed for Supportive Medications:  not needed  Medication Assistance Program  Refill Coordination  Has the Patients' Contact Information Changed:  No    Is the Shipping Address Different:  No    Shipping Information  Delivery Scheduled:  Yes  Delivery Date:  07/30/17  Medications to be Shipped:      MEDICATIONS    -Symdeko 100/150mg  and 150mg   -Creon 36,000                   -Confirmed the medication and dosage are correct and have not changed: Yes, regimen is correct and unchanged.             ADDITIONAL NOTES          N/A                     Jolene Schimke  Specialty Pharmacy Technician

## 2017-07-29 MED FILL — SYMDEKO/100-150/TBPK: SYMDEKO/100-150/TBPK | 28 days supply | Qty: 56 | Fill #1

## 2017-07-29 MED FILL — CREON/36000 UNITS/CPEP: CREON/36000 UNITS/CPEP | 16 days supply | Qty: 600 | Fill #1

## 2017-08-09 MED ORDER — MONTELUKAST 10 MG TABLET
ORAL_TABLET | Freq: Every evening | ORAL | 1 refills | 0.00000 days | Status: CP
Start: 2017-08-09 — End: 2017-10-23

## 2017-08-09 MED ORDER — ALBUTEROL SULFATE HFA 90 MCG/ACTUATION AEROSOL INHALER
Freq: Four times a day (QID) | RESPIRATORY_TRACT | 1 refills | 0.00000 days | Status: CP | PRN
Start: 2017-08-09 — End: ?

## 2017-08-09 NOTE — Unmapped (Signed)
Error

## 2017-08-13 NOTE — Unmapped (Signed)
Patient messaged provider through MyChart:    hello, dr Koren Shiver,     I am writing to let you know for the past several weeks I have been feeling pretty bad, my chest always feels tight. I have a productive cough and increased cough, what is coming up is a dark green. I get out of breath very easier. just walking or getting dressed makes me feel like I ran two miles. I should have contacted you guys sooner but I wanted to wait till the first of the year. I was just wondering what you think needs to happen. I honestly feel like I need to be admitted to be totally honest. thank you       Communicated patient's request to Dr. Koren Shiver. He agrees that patient should be admitted.     Left message with patient to come to [facility] ED. Also responded to patient's MyChart message.     [name], RN  CF Nurse Coordinator   [phone]

## 2017-08-14 NOTE — Unmapped (Signed)
[facility]  Adult Cystic Fibrosis Clinic    As part of [facility] [employer], [place] mailed patient [phone] gift card for food due to financial difficulties and associated food insecurity.        Mercy Moore, LCSW

## 2017-08-19 ENCOUNTER — Ambulatory Visit
Admit: 2017-08-19 | Discharge: 2017-08-22 | Disposition: A | Discharge status: Home / Self Care | Payer: PRIVATE HEALTH INSURANCE | Admitting: Pulmonary Disease

## 2017-08-19 LAB — CBC W/ AUTO DIFF
BASOPHILS ABSOLUTE COUNT: 0 10*9/L (ref 0.0–0.1)
EOSINOPHILS ABSOLUTE COUNT: 0 10*9/L (ref 0.0–0.4)
HEMATOCRIT: 40.6 % — ABNORMAL LOW (ref 41.0–53.0)
HEMOGLOBIN: 13.1 g/dL — ABNORMAL LOW (ref 13.5–17.5)
LARGE UNSTAINED CELLS: 3 % (ref 0–4)
LYMPHOCYTES ABSOLUTE COUNT: 1.6 10*9/L (ref 1.5–5.0)
MEAN CORPUSCULAR HEMOGLOBIN CONC: 32.2 g/dL (ref 31.0–37.0)
MEAN CORPUSCULAR HEMOGLOBIN: 23.5 pg — ABNORMAL LOW (ref 26.0–34.0)
MEAN PLATELET VOLUME: 8.7 fL (ref 7.0–10.0)
MONOCYTES ABSOLUTE COUNT: 0.4 10*9/L (ref 0.2–0.8)
NEUTROPHILS ABSOLUTE COUNT: 3.3 10*9/L (ref 2.0–7.5)
RED BLOOD CELL COUNT: 5.57 10*12/L (ref 4.50–5.90)
RED CELL DISTRIBUTION WIDTH: 18.2 % — ABNORMAL HIGH (ref 12.0–15.0)
WBC ADJUSTED: 5.5 10*9/L (ref 4.5–11.0)

## 2017-08-19 LAB — COMPREHENSIVE METABOLIC PANEL
ALBUMIN: 4.7 g/dL (ref 3.5–5.0)
ALKALINE PHOSPHATASE: 85 U/L (ref 38–126)
ALT (SGPT): 66 U/L (ref 19–72)
ANION GAP: 10 mmol/L (ref 9–15)
AST (SGOT): 45 U/L (ref 19–55)
BLOOD UREA NITROGEN: 14 mg/dL (ref 7–21)
BUN / CREAT RATIO: 16
CALCIUM: 9.8 mg/dL (ref 8.5–10.2)
CHLORIDE: 105 mmol/L (ref 98–107)
CO2: 26 mmol/L (ref 22.0–30.0)
CREATININE: 0.87 mg/dL (ref 0.70–1.30)
EGFR MDRD AF AMER: >=60 mL/min/{1.73_m2} (ref >=60)
EGFR MDRD NON AF AMER: >=60 mL/min/{1.73_m2} (ref >=60)
GLUCOSE RANDOM: 138 mg/dL (ref 65–179)
POTASSIUM: 4.9 mmol/L (ref 3.5–5.0)
PROTEIN TOTAL: 7.4 g/dL (ref 6.5–8.3)
SODIUM: 141 mmol/L (ref 135–145)

## 2017-08-19 LAB — INR: Lab: 1.13

## 2017-08-19 LAB — SMEAR REVIEW

## 2017-08-19 LAB — EGFR MDRD AF AMER: Glomerular filtration rate/1.73 sq M.predicted.black:ArVRat:Pt:Ser/Plas/Bld:Qn:Creatinine-based formula (MDRD): >=60

## 2017-08-19 LAB — C-REACTIVE PROTEIN: C reactive protein:MCnc:Pt:Ser/Plas:Qn:: 9.8

## 2017-08-19 LAB — MEAN CORPUSCULAR VOLUME: Lab: 73 — ABNORMAL LOW

## 2017-08-19 NOTE — Unmapped (Signed)
[facility]   Emergency Department Provider Note    ED Clinical Impression     Final diagnoses:   Cystic fibrosis with pulmonary exacerbation (CMS-HCC) (Primary)       Impression, Clinical Decision Making    Time seen: August 19, 2017 1:07 PM    BP 139/85  - Pulse 101  - Temp 36.9 ??C (98.4 ??F)  - Resp 18  - SpO2 95%     Impression/Decision Making:    Christian Young is a 32 y.o. man with CF and and exacerbation that has failed PO antibiotics. Will plan for CXR, labs, and likely admission    02:15: CBC and CMP normal, CXR without focal infiltrate, spoke with inpatient pulmonary team who will plan to admit the patient for IV antibiotics.    Plan Discussed with my attending, Dr. Bosie Helper, who is agreeable.     ____________________________________________    I have reviewed the triage vital signs and the nursing notes.    Portions of this note have been dictated using Animal nutritionist. Errors have been sought but all may not have been found or corrected.      History     Chief Complaint  Shortness of Breath      HPI   Christian Young is a 32 y.o. male with cystic fibrosis [phone] insertion) on Symdeco s/p lobectomy 03/29/2017 complicated by pancreatic insufficiency and MAC infection who presents with 2-3 weeks of worsening cough and sputum production. He was given a course of PO minocycline and ciprofloxacin cover the holidays. However, he has had no improvement in his symptoms. He is still having copious sputum production and feels short of breath.     For his CF, he is using the vest and a flutter device for clearance as well as Pulmozyme, hypertonic saline, and albuterol. He reports compliance with these therapies. He has been on inhaled amikacin and clofazamine for his MAC. He denis fever, chills, diarrhea, urinary changes, or new rash. He has not travelled or had new sick contacts.     Past Medical History:   Diagnosis Date   ??? Anxiety    ??? Chronic pain disorder    ??? Cystic fibrosis (CMS-HCC)    ??? Depression    ??? Hypertension        Patient Active Problem List   Diagnosis   ??? Cystic fibrosis with pulmonary exacerbation (CMS-HCC)   ??? Essential hypertension   ??? History of DVT (deep vein thrombosis)   ??? Depressive disorder, r/o major depression, recurrent, moderate/severe and dysthymic disorder   ??? Mood disorder (CMS-HCC)   ??? Anxiety   ??? Cystic fibrosis with intestinal manifestation (CMS-HCC)   ??? Chronic pansinusitis   ??? Pancreatic insufficiency due to cystic fibrosis (CMS-HCC)   ??? Mycobacterium abscessus infection   ??? Pseudomonas pneumonia (CMS-HCC)   ??? Anticoagulated   ??? Bronchiectasis (CMS-HCC)   ??? Chronic deep vein thrombosis (DVT) of lower extremity (CMS-HCC)       Past Surgical History:   Procedure Laterality Date   ??? PR REMOVAL OF LUNG,LOBECTOMY Right 03/29/2017    Procedure: REMOVAL OF LUNG, OTHER THAN PNEUMONECTOMY; SINGLE LOBE (LOBECTOMY);  Surgeon: Cherie Dark, MD;  Location: MAIN OR [facility];  Service: Thoracic       No current facility-administered medications for this encounter.     Current Outpatient Prescriptions:   ???  albuterol (PROVENTIL HFA;VENTOLIN HFA) 90 mcg/actuation inhaler, Inhale 2 puffs every six (6) hours as needed., Disp: 1 Inhaler, Rfl:  1  ???  amikacin (AMIKIN) 500 mg/2 mL injection, Do NOT RESUME until discussed with Primary Pulmonologist.   (Withdraw 2 mL (500mg ) and add to neb cup. Administer using nebulizer BID., Disp: 60 vial, Rfl: 0  ???  azithromycin (ZITHROMAX) 500 MG tablet, DO NOT RESTART until discussed with primary pulmonologist (500mg  daily), Disp: 30 tablet, Rfl: 1  ???  budesonide-formoterol (SYMBICORT) 160-4.5 mcg/actuation inhaler, Inhale 2 puffs Two (2) times a day., Disp: , Rfl:   ???  carvedilol (COREG) 3.125 MG tablet, Take 1 tablet (3.125 mg total) by mouth Two (2) times a day., Disp: 60 tablet, Rfl: 1  ???  cetirizine (ZYRTEC) 10 MG tablet, Take 10 mg by mouth daily., Disp: , Rfl:   ???  cholecalciferol, vitamin D3, 1,000 unit tablet, Take 1 tablet (1,000 Units total) by mouth daily., Disp: 30 tablet, Rfl: 11  ???  CLOFAZIMINE ORAL, Take 200 mg by mouth daily. , Disp: , Rfl:   ???  dornase alfa (PULMOZYME) 1 mg/mL nebulizer solution, Inhale 2.5 mg daily., Disp: 225 mL, Rfl: 3  ???  fluticasone (FLONASE) 50 mcg/actuation nasal spray, 2 sprays by Each Nare route daily., Disp: 16 g, Rfl: 0  ???  lamoTRIgine (LAMICTAL) 100 MG tablet, Take 100 mg by mouth daily., Disp: , Rfl:   ???  lipase-protease-amylase (CREON) 36,000-114,000- 180,000 unit CpDR, Take 7-8 capsules with meals and 5-6 capsules with snacks, Disp: 600 capsule, Rfl: 11  ???  lisinopril (PRINIVIL,ZESTRIL) 10 MG tablet, Take 1 tablet (10 mg total) by mouth daily., Disp: 90 tablet, Rfl: 0  ???  magnesium oxide (MAG-OX) 400 mg tablet, Take 2 tablets (800 mg total) by mouth Two (2) times a day., Disp: 120 tablet, Rfl: 11  ???  melatonin 3 mg Tab, Take 2 tablets (6 mg total) by mouth nightly., Disp: , Rfl: 0  ???  montelukast (SINGULAIR) 10 mg tablet, Take 1 tablet (10 mg total) by mouth nightly., Disp: 30 tablet, Rfl: 1  ???  MVW Complete, pediatric multivit 61-D3-vit K, 1,500-800 unit-mcg cap, Take 1 capsule by mouth Two (2) times a day., Disp: 30 capsule, Rfl: 0  ???  omeprazole (PRILOSEC) 20 MG capsule, Take 20 mg by mouth daily., Disp: , Rfl:   ???  oxyCODONE (ROXICODONE) 5 MG immediate release tablet, Take 1 tablet (5 mg total) by mouth every six (6) hours as needed for pain., Disp: 10 tablet, Rfl: 0  ???  PARI LC D NEBULIZER Misc, Provide 1 device  for use with inhaled medication., Disp: 3 each, Rfl: 5  ???  pramipexole (MIRAPEX) 0.125 MG tablet, Take 0.25 mg by mouth daily. , Disp: , Rfl:   ???  pregabalin (LYRICA) 300 MG capsule, Take 300 mg by mouth Two (2) times a day. , Disp: , Rfl:   ???  rivaroxaban (XARELTO) 20 mg tablet, Take 20 mg by mouth daily with evening meal., Disp: , Rfl:   ???  sodium bicarb-sodium chloride 700-2,300 mg Pack, Irrigate each nostril with 120 mL of solution (1 packet mixed with 240 mL of distilled water) BID, Disp: , Rfl:   ???  sodium chloride (NS) Soln, Add 1mL NaCl into neb cup along with amikacin., Disp: 600 mL, Rfl: 11  ???  sodium chloride 7% 7 % Nebu, Inhale 4 mL by nebulization Two (2) times a day., Disp: 720 mL, Rfl: 3  ???  syringe with needle (SYRINGE 3CC/20GX1) 3 mL 20 gauge x 1 Syrg, For use with inhaled antibiotic, Disp: 60 Syringe, Rfl: 11  ???  tezacaftor 100mg /ivacaftor 150mg  and ivacaftor 150mg  (SYMDEKO) tablets, TAKE TWICE DAILY AS DIRECTED ON PACKAGE LABELING, Disp: 56 tablet, Rfl: 3  ???  vilazodone HCl (VIIBRYD ORAL), Take 40 mg by mouth daily. , Disp: , Rfl:     Allergies  Cayston [aztreonam lysine]; Cefepime; Slo-bid 100; Banana; and Tobramycin    Family History   Problem Relation Age of Onset   ??? Bipolar disorder Mother    ??? Depression Mother        Social History  Social History   Substance Use Topics   ??? Smoking status: Never Smoker   ??? Smokeless tobacco: Never Used   ??? Alcohol use No       Review of Systems  Constitutional: Negative for fever/chills  Eyes: Negative for blurry vision.   ENT: Negative for sore throat.  Cardiovascular: Negative for chest pain  Respiratory: Negative for shortness of breath. Negative cough.   Gastrointestinal: Negative for abdominal pain. Negative for N/V/D.   Genitourinary: Negative for dysuria  Musculoskeletal: negative for joint pain  Skin: Negative for rash.   Neurological: Negative for headaches, focal weakness or numbness.    Physical Exam     VITAL SIGNS:    BP 139/85  - Pulse 101  - Temp 36.9 ??C (98.4 ??F)  - Resp 18  - SpO2 95%     Constitutional: Alert and oriented. Coughing frequently and slightly ill appearing  Eyes: No scleral icterus.   ENT       Head: Normocephalic and atraumatic.       Nose: No congestion. No epistaxis.        Mouth/Throat: Mucous membranes are moist. mucus and cobblestoning of the posterior oropharynx        Neck: supple, full ROM  Cardiovascular: Normal rate, regular rhythm. Normal S1/S2. No murmurs, rubs, or gallops. Normal and symmetric distal pulses are present in all extremities. Right chest port site c/d/i  Respiratory: Normal respiratory effort. Breath sounds are normal. No wheezes, crackles, rhonchi, or rales auscultated.   Gastrointestinal: Soft and nontender, nondistended, no rebound.   Musculoskeletal: normal ROM, no edema, no deformities  Neurologic: Normal speech and language. CN II-XII intact bilaterally. Symmetric 5/5 strength in bilateral upper and lower extremities, sensation intact bilaterally, normal cerebellar testing.   Skin: Skin is warm, dry and intact. No rash noted.     Radiology     XR Chest 2 views   Final Result      Bilateral pleural effusions, new on the left side.      Additional chronic findings as described above.             Tobey Grim, MD  Resident  08/19/17 [phone]

## 2017-08-19 NOTE — Unmapped (Signed)
Patient rounds completed. The following patient needs were addressed:  Pain, Personal Belongings, Plan of Care, Call Bell in Reach and Bed Position Low .

## 2017-08-19 NOTE — Unmapped (Signed)
General Medicine History and Physical    Assessment/Plan:    Principal Problem:    Cystic fibrosis with pulmonary exacerbation (CMS-HCC)  Active Problems:    Essential hypertension    History of DVT (deep vein thrombosis)    Pancreatic insufficiency due to cystic fibrosis (CMS-HCC)    Mycobacterium abscessus infection      [facility] is a 32 y.o. male with PMHx significant for CF s/p RUL lobectomy 03/2017, CF related PI, HTN, DVT hx on Xarelto, depression and restless legs syndrome that presented to [facility] with Cystic fibrosis with pulmonary exacerbation (CMS-HCC).    Cystic fibrosis with pulmonary exacerbation with acute exacerbation of bronchiectasis: [phone] insertion) on Symdeco. Lobectomy 03/29/2017 of RUL for concern contributing to recurrent infections in zone with decreased perfusion likely secondary to scarring. Worsening of symptoms over past few weeks, not responsive to outpatient antibiotic management (minocycline and ciprofloxacin).  Most recent cultures have grown smooth PsA, though history of stenotrophomonas and Mycobacterium abscessus infection. Patient HDS and afebrile, labs unremarkable. No focal consolidation on CXR. Last PFTs 06/20/17 with reduced FEV1 (58%) and FVC (65%) from pre-lobectomy baseline.  Will start IV abx and continue coverage for mycobacterium. Ongoing shortness of breath after lobectomy, will evaluate further for diaphragm dysfunction.   - IV Zosyn 4.5g/q6H (1/7- ), IV tobramycin 400mg  q24H (1/7- )  - Continue Azithromycin and Clofazimine for Mycobacterium treatment; holding inhaled amikacin with start of tobramycin   - Continue minocycline for stenotrophomonas coverage  - send CRP    - f/u sputum cultures: aerobic, fungal, AFB  - f/u RVP, Rapid flu/RSV  - airway clearance with HTS and albuterol nebs; chest vest and aerobika, q4H with RT  - continue Pulmozyme nebs daily  - continue Symdeko   - continue Symbicort   - PFTs in both supine and upright to further evaluate elevated R hemidiaphragm and worsened PFTs post-lobectomy  - Can consider Sniff Test while inpatient    - Dr. Koren Shiver (primary pulmonologist) notified of admission and discussed antibiotic plan as above   - check Vitamin levels (A,E) prior to discharge    CF induced PI: Continuing home Creon.     HTN: Continue home Lisinopril and Carvedilol.      History of DVT: Continue home Rivaroxaban.     Chronic pansinusitis: Continue Flonase, Zyrtec, Singulair.     Depressive disorder: Continue Lamictal and Vilazodone. Check QTc.     Restless legs syndrome: Continue home pramipexole.     Insomnia: Nightly trazodone.     F: PO  E: replete prn  N: High calorie high protein     DVT prophylaxis: Home rivaroxaban.    Dispo: MED G, floor status    Code Status: Full  __________________________________________________________________    Chief Complaint:  Chief Complaint   Patient presents with   ??? Shortness of Breath     Cystic fibrosis with pulmonary exacerbation (CMS-HCC)    HPI:  Christian Young is a 32 y.o. male with PMHx significant for CF s/p RUL lobectomy 03/2017, CF related PI, HTN, DVT hx on Xarelto, depression and restless legs syndrome that presented to [facility] with Cystic fibrosis with pulmonary exacerbation (CMS-HCC).    Patient reports initially onset of sinus symptoms in middle of December. Headache and sinus congestion at that time with an associated sore throat. Thought he was getting a sinus infection and was subsequently started on minocycline and ciprofloxacin by Dr. Koren Shiver at that time. No known sick contacts per patient but father and wife  work in high traffic jobs.     Reports feeling better for a few days after starting antibiotics, but then symptoms developed into more of a chest cold with worsening cough and increased sputum production, yellow-green in color. No blood that he has appreciated. Tablespoonfull of sputum daily, which has stayed about the same over the course of the past week. Worsening shortness of breath, now unable to walk to the bathroom without triggering symptoms. Subjective fevers and chills. Reports night sweats, similar to when he was first diagnosed with M. Abscessus. Not drenching, but every night for last week. Doing three times a day airway clearance with chest vest, some difficulty at times bringing phlegm up.     Says throat is starting to get sore again with some sinus congestion but significantly improved from a sinus standpoint from his symptoms in December. No myalgias or joint aches. No pain when swallowing.     Appetite has decreased. No nausea, no vomiting. Stools regular and normal. Received a flu shot this year.    In the ED patient was HDS and afebrile. Labs without evidence of leukocytosis. CXR w/o opacification.       Allergies:  Cayston [aztreonam lysine]; Cefepime; Slo-bid 100; Banana; and Tobramycin    Medications:   Prior to Admission medications    Medication Dose, Route, Frequency   albuterol (PROVENTIL HFA;VENTOLIN HFA) 90 mcg/actuation inhaler 2 puffs, Inhalation, Every 6 hours PRN   amikacin (AMIKIN) 500 mg/2 mL injection Do NOT RESUME until discussed with Primary Pulmonologist.   (Withdraw 2 mL (500mg ) and add to neb cup. Administer using nebulizer BID.   azithromycin (ZITHROMAX) 500 MG tablet DO NOT RESTART until discussed with primary pulmonologist (500mg  daily)   budesonide-formoterol (SYMBICORT) 160-4.5 mcg/actuation inhaler 2 puffs, Inhalation, 2 times a day (standard)   busPIRone (BUSPAR) 30 MG tablet 30 mg, Oral, 2 times a day (standard)   carvedilol (COREG) 3.125 MG tablet 3.125 mg, Oral, 2 times a day (standard)   cetirizine (ZYRTEC) 10 MG tablet 10 mg, Oral, Daily (standard)   cholecalciferol, vitamin D3, 1,000 unit tablet 1,000 Units, Oral, Daily (standard)   CLOFAZIMINE ORAL 200 mg, Oral, Daily (standard)   dornase alfa (PULMOZYME) 1 mg/mL nebulizer solution 2.5 mg, Inhalation, Daily (standard)   fluticasone (FLONASE) 50 mcg/actuation nasal spray 2 sprays, Each Nare, Daily (standard)   lamoTRIgine (LAMICTAL) 100 MG tablet 100 mg, Oral, Daily (standard)   lipase-protease-amylase (CREON) 36,000-114,000- 180,000 unit CpDR Take 7-8 capsules with meals and 5-6 capsules with snacks   lisinopril (PRINIVIL,ZESTRIL) 10 MG tablet 10 mg, Oral, Daily (standard)   melatonin 3 mg Tab 6 mg, Oral, Nightly   montelukast (SINGULAIR) 10 mg tablet 10 mg, Oral, Nightly   MVW Complete, pediatric multivit 61-D3-vit K, 1,500-800 unit-mcg cap 1 capsule, Oral, 2 times a day (standard)   omeprazole (PRILOSEC) 20 MG capsule 20 mg, Oral, Daily (standard)   PARI LC D NEBULIZER Misc Provide 1 device  for use with inhaled medication.   pramipexole (MIRAPEX) 0.125 MG tablet 0.25 mg, Oral, Daily (standard)   pregabalin (LYRICA) 300 MG capsule 300 mg, Oral, 2 times a day (standard)   rivaroxaban (XARELTO) 20 mg tablet 20 mg, Oral, Daily   sodium bicarb-sodium chloride 700-2,300 mg Pack Irrigate each nostril with 120 mL of solution (1 packet mixed with 240 mL of distilled water) BID    sodium chloride (NS) Soln Add 1mL NaCl into neb cup along with amikacin.   sodium chloride 7% 7 % Nebu 4 mL,  Nebulization, 2 times a day (standard)   syringe with needle (SYRINGE 3CC/20GX1) 3 mL 20 gauge x 1 Syrg For use with inhaled antibiotic   tezacaftor 100mg /ivacaftor 150mg  and ivacaftor 150mg  (SYMDEKO) tablets TAKE TWICE DAILY AS DIRECTED ON PACKAGE LABELING   traMADol (ULTRAM) 50 mg tablet 100 mg, Oral, Every 6 hours PRN   traZODone (DESYREL) 100 MG tablet 100 mg, Oral, Nightly   vilazodone HCl (VIIBRYD ORAL) 40 mg, Oral, Daily (standard)       Medical History:  Past Medical History:   Diagnosis Date   ??? Anxiety    ??? Chronic pain disorder    ??? Cystic fibrosis (CMS-HCC)    ??? Depression    ??? Hypertension        Surgical History:  Past Surgical History:   Procedure Laterality Date   ??? PR REMOVAL OF LUNG,LOBECTOMY Right 03/29/2017    Procedure: REMOVAL OF LUNG, OTHER THAN PNEUMONECTOMY; SINGLE LOBE (LOBECTOMY);  Surgeon: Cherie Dark, MD;  Location: MAIN OR [facility];  Service: Thoracic       Social History:  Social History     Social History   ??? Marital status: Married     Spouse name: N/A   ??? Number of children: N/A   ??? Years of education: N/A     Occupational History   ??? Not on file.     Social History Main Topics   ??? Smoking status: Never Smoker   ??? Smokeless tobacco: Never Used   ??? Alcohol use No   ??? Drug use: No   ??? Sexual activity: Yes     Partners: Female     Birth control/ protection: Condom     Other Topics Concern   ??? Not on file     Social History Narrative   ??? No narrative on file       Family History:  Family History   Problem Relation Age of Onset   ??? Bipolar disorder Mother    ??? Depression Mother        Review of Systems:  10 systems reviewed and are negative unless otherwise mentioned in HPI    Labs/Studies:  Labs per EMR and Reviewed (last 24hrs)    Physical Exam:  Temp:  [36.6 ??C-36.9 ??C] 36.6 ??C  Heart Rate:  [78-101] 78  Resp:  [18-24] 20  BP: (111-139)/(68-85) 111/68  SpO2:  [95 %-100 %] 100 %    Physical Exam:  BP 111/68  - Pulse 78  - Temp 36.6 ??C (Oral)  - Resp 20  - Ht 182.9 cm (6')  - Wt (!) 118.5 kg (261 lb 3.2 oz)  - SpO2 100%  - BMI 35.43 kg/m??   General appearance: Alert, in NAD, comfortable appearing  HEENT: Sclera anicteric. Pharyngeal erythema w/o exduates. No cervical or supraclavicular lymphadenopathy. No maxillary or frontal sinus tenderness.  Lungs: CTAB, moving good air b/l, comfortable on RA w/o increased work of breathing   Heart: RRR, no m/r/g.   Abdomen: Soft, NTND. Normoactive bowel sounds, no organomegaly   Extremities: Warm, well perfused, no edema.   Pulses: 2+ and symmetric.  Skin: No rashes  Neurologic: Grossly normal

## 2017-08-19 NOTE — Unmapped (Signed)
Pt states increasing cough and SOB -  Denies fevers

## 2017-08-19 NOTE — Unmapped (Signed)
Patient rounds completed. The following patient needs were addressed:  Personal Belongings, Plan of Care, Call Bell in Reach and Bed Position Low .

## 2017-08-20 LAB — COMPREHENSIVE METABOLIC PANEL
ALBUMIN: 4.2 g/dL (ref 3.5–5.0)
ALKALINE PHOSPHATASE: 85 U/L (ref 38–126)
ALT (SGPT): 67 U/L (ref 19–72)
ANION GAP: 11 mmol/L (ref 9–15)
AST (SGOT): 40 U/L (ref 19–55)
BILIRUBIN TOTAL: 0.6 mg/dL (ref 0.0–1.2)
BUN / CREAT RATIO: 14
CALCIUM: 9.7 mg/dL (ref 8.5–10.2)
CHLORIDE: 102 mmol/L (ref 98–107)
CO2: 28 mmol/L (ref 22.0–30.0)
CREATININE: 1.07 mg/dL (ref 0.70–1.30)
EGFR MDRD AF AMER: >=60 mL/min/{1.73_m2} (ref >=60)
EGFR MDRD NON AF AMER: >=60 mL/min/{1.73_m2} (ref >=60)
GLUCOSE RANDOM: 152 mg/dL (ref 65–179)
POTASSIUM: 4.9 mmol/L (ref 3.5–5.0)
PROTEIN TOTAL: 6.7 g/dL (ref 6.5–8.3)
SODIUM: 141 mmol/L (ref 135–145)

## 2017-08-20 LAB — CBC W/ AUTO DIFF
BASOPHILS ABSOLUTE COUNT: 0 10*9/L (ref 0.0–0.1)
EOSINOPHILS ABSOLUTE COUNT: 0 10*9/L (ref 0.0–0.4)
HEMATOCRIT: 39 % — ABNORMAL LOW (ref 41.0–53.0)
HEMOGLOBIN: 12.5 g/dL — ABNORMAL LOW (ref 13.5–17.5)
LARGE UNSTAINED CELLS: 1 % (ref 0–4)
MEAN CORPUSCULAR HEMOGLOBIN CONC: 32.1 g/dL (ref 31.0–37.0)
MEAN CORPUSCULAR HEMOGLOBIN: 23.6 pg — ABNORMAL LOW (ref 26.0–34.0)
MEAN CORPUSCULAR VOLUME: 73.6 fL — ABNORMAL LOW (ref 80.0–100.0)
MEAN PLATELET VOLUME: 8.8 fL (ref 7.0–10.0)
MONOCYTES ABSOLUTE COUNT: 0.6 10*9/L (ref 0.2–0.8)
NEUTROPHILS ABSOLUTE COUNT: 7.6 10*9/L — ABNORMAL HIGH (ref 2.0–7.5)
PLATELET COUNT: 320 10*9/L (ref 150–440)
RED BLOOD CELL COUNT: 5.3 10*12/L (ref 4.50–5.90)
RED CELL DISTRIBUTION WIDTH: 17.7 % — ABNORMAL HIGH (ref 12.0–15.0)

## 2017-08-20 LAB — HEMATOCRIT: Lab: 39 — ABNORMAL LOW

## 2017-08-20 LAB — MAGNESIUM: Magnesium:MCnc:Pt:Ser/Plas:Qn:: 1.4 — ABNORMAL LOW

## 2017-08-20 LAB — PHOSPHORUS: Phosphate:MCnc:Pt:Ser/Plas:Qn:: 4.4

## 2017-08-20 LAB — CO2: Carbon dioxide:SCnc:Pt:Ser/Plas:Qn:: 28

## 2017-08-20 NOTE — Unmapped (Signed)
Patient rounds completed. The following patient needs were addressed:  Pain, Personal Belongings, Plan of Care, Call Bell in Reach and Bed Position Low .

## 2017-08-20 NOTE — Unmapped (Signed)
Problem: Patient Care Overview  Goal: Plan of Care Review  Outcome: Progressing  Patient is A&O times 4. Vital signs ae stable. All medications given as ordered. Unable to obtain sputum culture, patient states he is not able to get any sputum. Will continue to monitor.

## 2017-08-20 NOTE — Unmapped (Signed)
Care Management  Initial Transition Planning Assessment              General  Care Manager assessed the patient by : In person interview with patient  Orientation Level: Oriented X4  Who provides care at home?: N/A    Contact/Decision Maker:    Contact Details  Contact Details: Primary Contact  Primary Contact Name: Rutherford Guys  Primary Contact Relationship: Spouse  Phone #1: [phone]    Advance Directive (Medical Treatment)  Does patient have an advance directive covering medical treatment?: Patient has advance directive covering medical treatment, copy not in chart.  Type of advance directive: : Health care power of attorney  Advance directive covering medical treatment not in Chart:: Copy requested from family  Surrogate decision maker appointed:: No  Reason there is not a surrogate decision maker appointed:: Patient does not wish to appoint a surrogate decision maker at this time  Information provided on advance directive:: No  Patient requests assistance:: No    Advance Directive (Mental Health Treatment)  Does patient have an advance directive covering mental health treatment?: Patient does not have advance directive covering mental health treatment.  Reason patient does not have an advance directive covering mental health treatment:: Patient does not wish to complete one at this time.    Patient Information:    Lives with: Spouse/significant other    Type of Residence: Private residence    Type of Residence: Mailing Address:  [address]  [place] [address]  Contacts: Contact Details: Primary Contact  Primary Contact Name: Devell Parkerson  Primary Contact Relationship: Spouse  Phone #1: [phone]  Patient Phone Number: [phone] (home)     Medical Provider(s): [facility]  Reason for Admission: Admitting Diagnosis:  Cystic fibrosis with pulmonary exacerbation (CMS-HCC) [E84.0]  Past Medical History:   has a past medical history of Anxiety; Chronic pain disorder; Cystic fibrosis (CMS-HCC); Depression; and Hypertension.  Past Surgical History:   has a past surgical history that includes pr removal of lung,lobectomy (Right, 03/29/2017).   Previous admit date: 05/30/2017    Primary Insurance- Payor: BCBS / Plan: BCBS OOS PLAN / Product Type: *No Product type* /   Secondary Insurance ??? None  Prescription Coverage ???   Preferred Pharmacy - [id number] [place], [place] - [address]  [facility] [place] - [address]  [facility] [place] [place] [address]  [facility] - [place], [place] - [address]  [facility] [place] [place] - [address]    Transportation home: Private vehicle  Level of function prior to admission: Independent     Location/Detail: [address] [place], [place]    Support Systems: Family Members, Friends/Neighbors, Spouse    Home Care services in place prior to admission?: No     Outpatient/Community Resources in place prior to admission: Clinic  Agency detail (Name/Phone #): [facility]    Equipment Currently Used at Home: respiratory supplies  Current HME Agency (Name/Phone #): nebulizer, percussion vest    Currently receiving outpatient dialysis?: No     Financial Information:     Patient source of income: Spouse employed, [employer]    Need for financial assistance?: No     Discharge Needs Assessment:    Concerns to be Addressed: care coordination/care conferences    Clinical Risk Factors: Multiple Diagnoses (Chronic)  Barriers to taking medications: No    Prior overnight hospital stay or ED visit in last 90 days: Yes    Readmission Within the Last 30 Days: no previous admission in last 30 days    Anticipated Changes Related to Illness: none    Equipment Needed After Discharge: none    Discharge Facility/Level of Care Needs:  (home possibly with HI)    Patient at risk for readmission?: Yes Discharge Plan:    Screen findings are: Discharge planning needs identified or anticipated (Comment).    Expected Discharge Date: 09/03/17    Expected Transfer from Critical Care:      Patient and/or family were provided with choice of facilities / services that are available and appropriate to meet post hospital care needs?: Yes   List choices in order highest to lowest preferred, if applicable. : [facility], they provide port care    Initial Assessment complete?: Yes

## 2017-08-20 NOTE — Unmapped (Signed)
Aminoglycoside Initiation Pharmacy Note    Christian Young is a 32 y.o. male being initiated on tobramycin for CF exacerbation.    Goal peak: 20 - 30 mg/L  Goal trough: < 1 mg/L     Pharmacokinetic Parameters:    Wt Readings from Last 1 Encounters:   08/19/17 (!) 118.5 kg (261 lb 3.2 oz)     Ideal body weight: 77 kg, Adjusted body weight: 93.8 kg    Lab Results   Component Value Date    CREATININE 0.87 08/19/2017     Vd = 32 L, ke = 0.28 hr-1    Recommended Dose: 480 mg IV q24 (1-hr infusion) per previous regimen    Pharmacy will continue to monitor and order levels as appropriate.  Patient will be followed for changes in renal function, toxicity, and efficacy.  Please page service pharmacist with questions/clarifications.    Laretta Alstrom,  PharmD

## 2017-08-20 NOTE — Unmapped (Signed)
Med G Daily Progress Note    Assessment/Plan:    Principal Problem:    Cystic fibrosis with pulmonary exacerbation (CMS-HCC)  Active Problems:    Essential hypertension    History of DVT (Young vein thrombosis)    Pancreatic insufficiency due to cystic fibrosis (CMS-HCC)    Mycobacterium abscessus infection  Resolved Problems:    * No resolved Young problems. *      Christian Young is a 32 y.o. male with PMHx significant for CF s/p RUL lobectomy 03/2017, CF related PI, HTN, DVT hx on Xarelto, depression and restless legs syndrome that presented to [facility] with Cystic fibrosis with pulmonary exacerbation (CMS-HCC).  ??   Cystic fibrosis with pulmonary exacerbation with acute exacerbation of bronchiectasis: [phone] insertion) on Symdeco. Lobectomy 03/29/2017 of RUL for concern contributing to recurrent infections in zone with decreased perfusion likely secondary to scarring. Worsening of symptoms over past few weeks, not responsive to outpatient antibiotic management (minocycline and ciprofloxacin).  Most recent cultures have grown smooth PsA, though history of stenotrophomonas and Mycobacterium abscessus infection. Patient HDS and afebrile, labs unremarkable. No focal consolidation on CXR. Last PFTs 06/20/17 with reduced FEV1 (58%) and FVC (65%) from pre-lobectomy baseline.  Will start IV abx and continue coverage for mycobacterium. Ongoing shortness of breath after lobectomy, will evaluate further for diaphragm dysfunction.   - IV Zosyn 4.5g/q6H (1/7- ), IV tobramycin 400mg  q24H (1/7- )  - Continue Azithromycin and Clofazimine for Mycobacterium treatment; holding inhaled amikacin with start of tobramycin   - Continue minocycline for stenotrophomonas coverage  - CRP 9.8 1/7  - f/u sputum cultures: aerobic, fungal, AFB  - f/u RVP, Rapid flu/RSV negative  - airway clearance with HTS and albuterol nebs; chest vest and aerobika, q4H with RT  - continue Pulmozyme nebs daily  - continue Symdeko   - continue Symbicort   - PFTs in both supine and upright to further evaluate elevated R hemidiaphragm and worsened PFTs post-lobectomy  - Sniff Test while inpatient    - Dr. Koren Young (primary pulmonologist) notified of admission and discussed antibiotic plan as above   - check Vitamin levels (A,E) prior to discharge  ??  CF induced PI: Continuing home Creon.   ??  HTN: Continue home Lisinopril and Carvedilol.    ??  History of DVT: Continue home Rivaroxaban.   ??  Chronic pansinusitis: Continue Flonase, Zyrtec, Singulair.   ??  Depressive disorder: Continue Lamictal and Vilazodone. Check QTc.   ??  Restless legs syndrome: Continue home pramipexole.   ??  Insomnia: Nightly trazodone.   ??  F: PO  E: replete prn  N: High calorie high protein   ??  DVT prophylaxis: Home rivaroxaban.  ??  Dispo: MED G, floor status  ??  Code Status: Full   ___________________________________________________________________  Interval History:  No acute events overnight.  Continues to have SOB and cough.  Difficulty coughing up sputum.  Denies subjective fevers, chills.      Labs/Studies:  Labs and Studies from the last 24hrs per EMR and Reviewed    Objective:  Temp:  [36.6 ??C (97.9 ??F)-36.9 ??C (98.4 ??F)] 36.7 ??C (98.1 ??F)  Heart Rate:  [78-101] 85  Resp:  [18-24] 20  BP: (109-139)/(68-85) 109/78  SpO2:  [93 %-100 %] 96 %    General: laying in bed, resting, in NAD  Cardiac: normal S1, S2, RRR, no m/r/g  Pulm: CTAB, slightly diminished breath sounds LUL, comfortable on RA w/o increased work of breathing, no wheezing  or crackles, good air entry b/l   Abd: soft, non-tender, non-distended, normoactive bowel sounds  Extremities: no peripheral edema, 2+ pulses bilaterally    Christian Young, MS3    I attest that I have reviewed the student note and that the components of the history of the present illness, the physical exam, and the assessment and plan documented were performed by me or were performed in my presence by the student where I verified the documentation and performed (or re-performed) the exam and medical decision making. Christian Bartko A. Laury Deep, MD

## 2017-08-20 NOTE — Unmapped (Signed)
Covering SW for ongoing outpatient CF SW, Serita Grit, LCSW, today visited with pt in his 6BT room as he was admitted yesterday with a CF pulmonary exacerbation.  Pt was in good spirits despite having been informed that I will likely have to be here for 2 weeks for IV's - it's a routine that I expect now.    Stated that his wife had been able to land a new EMT job after passing her exams; yet, it's a PT job which we understand will lead to a FT job sometime in the future - don't know yet how long that may be.  Stated that her first day with the new job will be Jan 16 as she's gathering all of the required documents now for her orientation.  While they had been hoping for a FT job, pt stated that they are grateful and doing a little better than before - even while we're still having to watch things closely.    Pt confirmed that they had received the most recent mailing of the CF Pt Assistance Funds for food resources (mailed by CF SW colleague, Mercy Moore, [place], on Jan 2) for which he is grateful.  Pt stated that he will be reaching back out to [facility] and [employer] for 2019 support assistance as needed during this transitional time of continued financial stress.    Pt also confirmed that he did reach out to [employer] for assistance with a Soc Sec Disability application.  Stated that they are waiting for some papers; and, then, we'll get the final application off.  Expressed gratitude for this referral and assistance.  Will ensure ongoing CF SW aware of all above.

## 2017-08-20 NOTE — Unmapped (Signed)
Problem: Cystic Fibrosis (Adult)  Goal: Signs and Symptoms of Listed Potential Problems Will be Absent, Minimized or Managed (Cystic Fibrosis)  Signs and symptoms of listed potential problems will be absent, minimized or managed by discharge/transition of care (reference Cystic Fibrosis (Adult) CPG).  Patient received first medications this shift without event or adverse effect this shift. The patient was compliant with inhaled respiratory medications and airway clearance this shift. No other changes or respiratory interventions made at this time. Will continue to monitor the patient's respiratory status.

## 2017-08-20 NOTE — Unmapped (Signed)
Problem: Patient Care Overview  Goal: Plan of Care Review  Outcome: Progressing  Patient is A&O times 4. Vital signs are stable. Denies pain or discomfort at this time. Medications administered as ordered. Currently getting iv antibiotics. Will continue to monitor.

## 2017-08-21 LAB — CBC W/ AUTO DIFF
EOSINOPHILS ABSOLUTE COUNT: 0 10*9/L (ref 0.0–0.4)
HEMATOCRIT: 37.4 % — ABNORMAL LOW (ref 41.0–53.0)
HEMOGLOBIN: 11.8 g/dL — ABNORMAL LOW (ref 13.5–17.5)
LARGE UNSTAINED CELLS: 3 % (ref 0–4)
MEAN CORPUSCULAR HEMOGLOBIN CONC: 31.6 g/dL (ref 31.0–37.0)
MEAN CORPUSCULAR HEMOGLOBIN: 23.1 pg — ABNORMAL LOW (ref 26.0–34.0)
MEAN CORPUSCULAR VOLUME: 73.3 fL — ABNORMAL LOW (ref 80.0–100.0)
MEAN PLATELET VOLUME: 9.2 fL (ref 7.0–10.0)
MONOCYTES ABSOLUTE COUNT: 0.5 10*9/L (ref 0.2–0.8)
NEUTROPHILS ABSOLUTE COUNT: 3.1 10*9/L (ref 2.0–7.5)
PLATELET COUNT: 283 10*9/L (ref 150–440)
RED BLOOD CELL COUNT: 5.1 10*12/L (ref 4.50–5.90)
RED CELL DISTRIBUTION WIDTH: 18.2 % — ABNORMAL HIGH (ref 12.0–15.0)
WBC ADJUSTED: 4.2 10*9/L — ABNORMAL LOW (ref 4.5–11.0)

## 2017-08-21 LAB — COMPREHENSIVE METABOLIC PANEL
ALBUMIN: 3.6 g/dL (ref 3.5–5.0)
ALKALINE PHOSPHATASE: 73 U/L (ref 38–126)
ALT (SGPT): 68 U/L (ref 19–72)
ANION GAP: 9 mmol/L (ref 9–15)
AST (SGOT): 39 U/L (ref 19–55)
BILIRUBIN TOTAL: 0.3 mg/dL (ref 0.0–1.2)
BLOOD UREA NITROGEN: 14 mg/dL (ref 7–21)
BUN / CREAT RATIO: 15
CALCIUM: 8.8 mg/dL (ref 8.5–10.2)
CHLORIDE: 105 mmol/L (ref 98–107)
CO2: 26 mmol/L (ref 22.0–30.0)
CREATININE: 0.92 mg/dL (ref 0.70–1.30)
EGFR MDRD AF AMER: >=60 mL/min/{1.73_m2} (ref >=60)
GLUCOSE RANDOM: 211 mg/dL — ABNORMAL HIGH (ref 65–179)
PROTEIN TOTAL: 5.8 g/dL — ABNORMAL LOW (ref 6.5–8.3)
SODIUM: 140 mmol/L (ref 135–145)

## 2017-08-21 LAB — MAGNESIUM: Magnesium:MCnc:Pt:Ser/Plas:Qn:: 1.8

## 2017-08-21 LAB — TOBRAMYCIN RANDOM: Tobramycin:MCnc:Pt:Ser/Plas:Qn:: >40

## 2017-08-21 LAB — GLUCOSE RANDOM: Glucose:MCnc:Pt:Ser/Plas:Qn:: 211 — ABNORMAL HIGH

## 2017-08-21 LAB — NEUTROPHILS ABSOLUTE COUNT: Lab: 3.1

## 2017-08-21 LAB — PHOSPHORUS: Phosphate:MCnc:Pt:Ser/Plas:Qn:: 4.4

## 2017-08-21 NOTE — Unmapped (Signed)
Problem: Patient Care Overview  Goal: Plan of Care Review  Outcome: Progressing   08/20/17 1853   OTHER   Plan of Care Reviewed With patient   Plan of Care Review   Progress improving   Patient's vital signs are stable and all meds have been passed. Patient's pain managed with tramadol and acetaminophen. Patient is resting comfortably . Continue with plan of care. No changes to report.    Problem: Cystic Fibrosis (Adult)  Goal: Signs and Symptoms of Listed Potential Problems Will be Absent, Minimized or Managed (Cystic Fibrosis)  Signs and symptoms of listed potential problems will be absent, minimized or managed by discharge/transition of care (reference Cystic Fibrosis (Adult) CPG).   Outcome: Progressing

## 2017-08-21 NOTE — Unmapped (Signed)
Problem: Patient Care Overview  Goal: Plan of Care Review  Outcome: Progressing  Patient completed SNIFF test this morning. Patient taken off of droplet precautions (respiratory viral panel negative). Port accessed and PIV removed. Patient provided sputum sample which was sent to lab. Patient states he plans on ambulating in the hallway this afternoon.   Goal: Individualization and Mutuality  Outcome: Progressing      Problem: Cystic Fibrosis (Adult)  Goal: Signs and Symptoms of Listed Potential Problems Will be Absent, Minimized or Managed (Cystic Fibrosis)  Signs and symptoms of listed potential problems will be absent, minimized or managed by discharge/transition of care (reference Cystic Fibrosis (Adult) CPG).   Outcome: Progressing      Problem: Pain, Acute (Adult)  Goal: Acceptable Pain Control/Comfort Level  Patient will demonstrate the desired outcomes by discharge/transition of care.   Outcome: Progressing      Problem: VTE, DVT and PE (Adult)  Goal: Signs and Symptoms of Listed Potential Problems Will be Absent, Minimized or Managed (VTE, DVT and PE)  Signs and symptoms of listed potential problems will be absent, minimized or managed by discharge/transition of care (reference VTE, DVT and PE (Adult) CPG).   Outcome: Progressing

## 2017-08-21 NOTE — Unmapped (Signed)
Problem: Patient Care Overview  Goal: Plan of Care Review  Outcome: Progressing  Patient alert and oriented. On RA. Continues on contact and droplet precautions. Patient has de-accessed, RCW port, RN offered to re-access during shift, patient requests to re-access on next day 08/21/17. Patient receiving abx via PIV, tolerating well. Will continue POC.    Problem: Cystic Fibrosis (Adult)  Goal: Signs and Symptoms of Listed Potential Problems Will be Absent, Minimized or Managed (Cystic Fibrosis)  Signs and symptoms of listed potential problems will be absent, minimized or managed by discharge/transition of care (reference Cystic Fibrosis (Adult) CPG).   Outcome: Progressing      Problem: Pain, Acute (Adult)  Goal: Acceptable Pain Control/Comfort Level  Patient will demonstrate the desired outcomes by discharge/transition of care.  Outcome: Progressing

## 2017-08-21 NOTE — Unmapped (Addendum)
Cystic Fibrosis Nutrition Assessment    Inpatient: MD Consult this admission and related follow up  Primary Pulmonologist: Dr. Koren Shiver  ===================================================================  Christian Young is a 32 y.o. male seen for medical nutrition therapy. Currently admitted with CF exacerbation for IV antibiotics.  - Reports decreased appetite over the last few weeks, denies s/s of malabsorption, diarrhea, constipation.  - Continues to feel that Creon works well, did not bring home Creon 36,000 with him this admit and this is not on inpatient drug formulary. Agreeable to use Creon 24,000 during admission.  ===================================================================  INTERVENTION  1. Monitor PO intake this admit. Do not recommend further decreasing kcal intake to meet weight loss goals. Encouraged increased physical activity (gym membership or possibly pulmonary rehab as directed by MD) as tolerated when IV antibiotic course is complete.    2. During admission continue Creon 24,000 (11 at meals, 7 at snacks).  - Can resume home Creon 36,000 after discharge.    3. Recommend increase supplemental vitamin D (only during admission) to 6000 International units vitamin D3 daily.  Continue inpatient CF vitamin regimen:  MVW Complete Formulation gel cap 2 daily   - note inpatient CF vitamin + 6000 International units vitamin D daily daily = total of 9000 International units vitamin D daily.  - When discharged home resume usual Complete Formulation D5000 softgels, 2 daily. Provides total of 10,000 International units vitamin D daily..    4. Weigh patient twice weekly this admit.    5. Continue remainder of nutrition regimen:  - acid reducer  - bowel regimen    Inpatient:   Will follow up with patient per protocol: 1-2 times per week (and more frequent as indicated)===================================================================  ASSESSMENT:  Cystic Fibrosis Nutrition Category = Outstanding Current diet is appropriate for CF. Patient continues to work towards goals for weight management.   Enzyme dose is within established guidelines. Vitamin prescription is not appropriate to reach/maintain optimal fat soluble vitamin levels. Patient would benefit from change in vitamin regimen. Sodium needs for CF met with PO and/or supplement. Bowel regimen is appropriate. Acid reducer appropriate for GERD and enzyme activation.    Malnutrition Assessment using AND/ASPEN Clinical Characteristics:  Patient does not meet AND/ASPEN criteria for malnutrition at this time (08/20/17 1245)    Malnutrition Assessment using AND/ASPEN Clinical Characteristics:  Patient does not meet AND/ASPEN criteria for malnutrition at this time (08/20/17 1245)    Goals:  1. Meet estimated daily needs: [phone] kcals (24-28 kcals/kg/day); 142-185 gm pro (DRI x 1.5 -2)); 3470 mL free water (Holliday Segar Method)  2. Reach/maintain established goals for CF:                Adult - BMI 22kg/m2 for CF females and 23kg/m2 for CF males  3. Normal fat-soluble vitamin levels: Vitamin A, Vitamin E and PT per lab range; Vitamin D 25OH total >30  4. Maintain glucose control. Carbohydrate content of diet should comprise 40-50% of total calorie needs, but carbohydrates are not restricted in this population.    5.  Meet sodium needs for CF  ===================================================================  INPATIENT:    Current Nutrition Orders (inpatient):       Nutrition Orders            Start     Ordered    08/19/17 1722  Nutrition Therapy High Calorie High Protein  Effective now     Question:  Nutrition Therapy (T):  Answer:  High Calorie High Protein    08/19/17 1721  CF Nutrition related medications (inpatient): Nutritionally relevant medications reviewed.   1000 International units vitamin D3 daily  Complete Formulation 2 softgels daily  Omeprazole  Creon 24,000 (11 at meals, 7 at snacks) provides 2200 units lipase/kg/meal & 81191 units lipase/kg/day  xarelto  Symdeko  ondansetron PRN  TUMS PRN  miralax PRN  Senna PRN    CF Nutrition related labs (inpatient):  08-20-17: none pertinent, Glu WNL    Last 5 Recorded Weights    08/19/17 1832   Weight: (!) 118.5 kg (261 lb 3.2 oz)   ==================================================================  CLINICAL DATA:  Past Medical History:   Diagnosis Date   ??? Anxiety    ??? Chronic pain disorder    ??? Cystic fibrosis (CMS-HCC)    ??? Depression    ??? Hypertension    - s/p RUL lobectomy 03-29-17  - Per review of Care Everywhere PMHx also includes: CFRD, PI, essential HTN    Anthroprometric Evaluation:  Weight changes: Weight down since November. He would like to work towards weight loss.  Per review of Care Everywhere: March 2018 weight 262 pounds  BMI Readings from Last 1 Encounters:   08/19/17 35.43 kg/m??     Wt Readings from Last 3 Encounters:   08/19/17 (!) 118.5 kg (261 lb 3.2 oz)   06/20/17 (!) 122.5 kg (270 lb)   06/04/17 (!) 120.6 kg (265 lb 14 oz)     Ht Readings from Last 3 Encounters:   08/19/17 182.9 cm (6')   06/20/17 182 cm (5' 11.65)   05/31/17 182.9 cm (6')   ==================================================================  Energy Intake (outpatient):  Diet: High in calories, fat, salt. Diet evaluated this visit. Reports decreased appetite with current illness.  Food allergies: Per review of Care Everywhere note banana is listed as allergy.  Diet and CFTR modulators: Prescribed Symdeko (tezacaftor/ivacaftor).    PO Supplements: none  Appetite Stimulant: none  Enteral feeding tube: n/a  Sodium in diet: Adequate from diet  Calcium in diet:  Adequate from diet     Physical Activity: minimal since surgery in August. Has gym membership. Has attended pulmonary rehab in the past.    Fat Malabsorption (outpatient):  Enzyme brand, (meals/snacks):   Creon 36,000 (7 at meals, 5 at snacks)   - Switched from Zenpep to Creon due to report of bloating & greasy stools when on Zenpep.   Enzyme administration details: correct pre-meal administration., moderate compliance, swallows capsules whole  Enzyme dose per MEAL (units lipase/kg/meal) 2100  Enzyme dose per DAY (units lipase/kg/day) 10936  Stools: denies s/s of malabsorption/diarrhea/constipation  Abdominal pain: None reported  Fecal Fat Studies:    Pancreatic Elastase-1   Date Value Ref Range Status   03/19/2017 <15 (L) mcg/g Final     Comment:     Adult and Pediatric Reference Ranges for    Pancreatic Elastase-1:                  Normal:      >200 mcg/g  Moderate Pancreatic        Insufficiency:   100-200 mcg/g    Severe Pancreatic        Insufficiency:      <100 mcg/g     Elastase-1 (E-1) assay results are expressed  in mcg/g, which represent mcg E1/g feces.     It is not necessary to interrupt enzyme  substitution therapy.     Test Performed by:  [facility]  [address]  [place], [place] [address]  GI meds: Nutritionally relevant medications reviewed. MVW probiotic, omeprazole.    Vitamins/Minerals (outpatient):  CF-specific MVI, dose, compliance: MVW Complete Formulation Softgel D-5000 2 daily  Other vitamins/minerals/herbals: none  Calcium supplement: none  Patient Resources: -[employer] (phone [phone] [email or web address])   - Orders CF vitamin and probiotic directly from manufacturer (MVW), cost billed directly to [employer] grant  - He is thinks he may also be enrolled in [employer] but he is unsure at this time.  Fat-soluble vitamin levels:  Lab Results   Component Value Date/Time    VITAMINA 44.4 11/19/2016 0647     Lab Results   Component Value Date/Time    VITDTOTAL 27.9 03/19/2017 0508        Lab Results   Component Value Date/Time    PT 12.9 (H) 08/19/2017 1340    PT 11.1 03/29/2017 0508    PT 11.5 03/28/2017 0519    PT 14.1 (H) 11/29/2016 0807    PT 15.3 (H) 11/26/2016 0529   03-20-17: normal DesCarboxyPT (also known as PIVKA) indicates no vitamin K deficiency.      Bone Health: unknown CF Related Diabetes: Yes, Per Care EveryWhere has been on 5 units levemir at bedtime in the past. Also noted per Care Everywhere he was supposed to have had Endo appointment at outside facility in March 2018 - unclear if he made it to this appointment.        Lab Results   Component Value Date/Time    A1C 6.1 (H) 11/19/2016 0981

## 2017-08-21 NOTE — Unmapped (Signed)
Med G Daily Progress Note    Assessment/Plan:    Principal Problem:    Cystic fibrosis with pulmonary exacerbation (CMS-HCC)  Active Problems:    Essential hypertension    History of DVT (deep vein thrombosis)    Pancreatic insufficiency due to cystic fibrosis (CMS-HCC)    Mycobacterium abscessus infection  Resolved Problems:    * No resolved hospital problems. *      Christian Young is a 32 y.o. male with PMHx significant for CF s/p RUL lobectomy 03/2017, CF related PI, HTN, DVT hx on Xarelto, depression and restless legs syndrome that presented to [facility] with Cystic fibrosis with pulmonary exacerbation (CMS-HCC).  ??   Cystic fibrosis with pulmonary exacerbation with acute exacerbation of bronchiectasis: [phone] insertion) on Symdeco. Lobectomy 03/29/2017 of RUL for concern contributing to recurrent infections in zone with decreased perfusion likely secondary to scarring. Worsening of symptoms over past few weeks, not responsive to outpatient antibiotic management (minocycline and ciprofloxacin).  Most recent cultures have grown smooth PsA, though history of stenotrophomonas and Mycobacterium abscessus infection. Patient HDS and afebrile, labs unremarkable. No focal consolidation on CXR. PFTs 08/20/17 with FEV1 (59%) and FVC (63%) consistent with PFTs from 06/20/17.  Will continue IV abx and continue coverage for mycobacterium. Sniff test reassuring, no diaphragm dysfunction. Plan to pursue lung volumes if able.   - IV Zosyn 4.5g/q6H (1/7- ), IV tobramycin 400mg  q24H (1/7- )  - Continue Azithromycin and Clofazimine for Mycobacterium treatment; holding inhaled amikacin with tobramycin   - Continue minocycline for stenotrophomonas coverage  - CRP 9.8 1/7  - f/u sputum cultures: aerobic, AFB  - RVP negative, Rapid flu/RSV negative   - airway clearance with HTS and albuterol nebs; chest vest and aerobika, q4H with RT  - continue Pulmozyme nebs daily  - continue Symdeko   - continue Symbicort   - consider PFTs in both supine and upright to further evaluate elevated R hemidiaphragm and worsened PFTs post-lobectomy  - CT sinus       CF induced PI:   - Creon 24,000 (11 at meals, 7 at snacks).  Adjustment from home Creon 36,000 due to formulary.  - Vitamin D3 6000 IU  - MVW Complete Formulation gel cap 2 daily     HTN: Continue home Lisinopril and Carvedilol.    ??  History of DVT: Continue home Rivaroxaban.   ??  Chronic pansinusitis: Continue Flonase, Zyrtec, Singulair.   ??  Depressive disorder: Continue Lamictal and Vilazodone. Check QTc.   ??  Restless legs syndrome: Continue home pramipexole.   ??  Insomnia: Nightly trazodone.   ??  F: PO  E: replete prn  N: High calorie high protein   ??  DVT prophylaxis: Home rivaroxaban.  ??  Dispo: MED G, floor status  ??  Code Status: Full   ___________________________________________________________________  Interval History:  No acute events overnight.  Continues to have sore throat and night sweats. SOB improved, cough remains persistent and he has difficulty coughing up sputum.        Labs/Studies:  Labs and Studies from the last 24hrs per EMR and Reviewed    Objective:  Temp:  [36.9 ??C (98.4 ??F)-37.6 ??C (99.7 ??F)] 37.1 ??C (98.8 ??F)  Heart Rate:  [90-124] 92  Resp:  [16-20] 18  BP: (110-146)/(56-86) 110/56  SpO2:  [95 %-98 %] 98 %    General: laying in bed, resting, in NAD  Cardiac: normal S1, S2, RRR, no m/r/g  Pulm: CTAB, comfortable on RA  w/o increased work of breathing, no wheezing or crackles, good air entry b/l   Abd: soft, non-tender, non-distended, normoactive bowel sounds  Extremities: no peripheral edema, 2+ pulses bilaterally    [name], MS3    I attest that I have reviewed the student note and that the components of the history of the present illness, the physical exam, and the assessment and plan documented were performed by me or were performed in my presence by the student where I verified the documentation and performed (or re-performed) the exam and medical decision making.    Jannet Mantis, MD  PGY-1, [facility]

## 2017-08-22 LAB — TOBRAMYCIN RANDOM
Tobramycin:MCnc:Pt:Ser/Plas:Qn:: 17.1
Tobramycin:MCnc:Pt:Ser/Plas:Qn:: 2.7

## 2017-08-22 MED ORDER — PIPERACILLIN-TAZOBACTAM 4.5 GRAM INTRAVENOUS SOLUTION
Freq: Four times a day (QID) | INTRAVENOUS | 0 refills | 0 days | Status: CP
Start: 2017-08-22 — End: 2017-09-04

## 2017-08-22 NOTE — Unmapped (Signed)
Problem: Patient Care Overview  Goal: Plan of Care Review  Outcome: Progressing  Patient alert and oriented. On RA. Patient received all medications as prescribed. Pt had CT of sinus. Will continue POC.    Problem: Cystic Fibrosis (Adult)  Goal: Signs and Symptoms of Listed Potential Problems Will be Absent, Minimized or Managed (Cystic Fibrosis)  Signs and symptoms of listed potential problems will be absent, minimized or managed by discharge/transition of care (reference Cystic Fibrosis (Adult) CPG).   Outcome: Progressing      Problem: Pain, Acute (Adult)  Goal: Acceptable Pain Control/Comfort Level  Patient will demonstrate the desired outcomes by discharge/transition of care.   Outcome: Progressing      Problem: VTE, DVT and PE (Adult)  Goal: Signs and Symptoms of Listed Potential Problems Will be Absent, Minimized or Managed (VTE, DVT and PE)  Signs and symptoms of listed potential problems will be absent, minimized or managed by discharge/transition of care (reference VTE, DVT and PE (Adult) CPG).   Outcome: Progressing

## 2017-08-22 NOTE — Unmapped (Signed)
Aminoglycoside Follow-Up Pharmacy Note    Christian Young is a 32 y.o. male on tobramycin 480 mg IV  every 24 hours for CF exacerbation.  Currently on day 3 of therapy.    Goal peak: 20-35 mg/L  Goal trough: gentamicin/tobramycin <1    Measured peak = 17.1 mg/L (drawn at 0549, ~45 minutes after end of infusion)  Calculated peak = 21.07 mg/L    Measured trough/random = 2.7 mg/L (drawn at 1218, 16 hours before the start of the next infusion)  Calculated trough = 0.03 mg/L    Patient-Specific Pharmacokinetic Parameters:  Vd = 19.85 L, ke = 0.285 hr-1    Based on levels, recommend continue at 480mg  every 24 hours.    Pharmacy will continue to monitor for changes in renal function, toxicity, and efficacy and order additional levels as needed. Please page service pharmacist with questions/clarifications.    Thayer Headings, PharmD Candidate 2019   Edwin Cap, PharmD

## 2017-08-22 NOTE — Unmapped (Signed)
Christian Young is a 32 y.o. male with PMHx significant for CF s/p RUL lobectomy 03/2017, CF related PI, HTN, DVT hx on Xarelto, depression and restless legs syndrome that presented to [facility] with Cystic fibrosis with pulmonary exacerbation     Cystic fibrosis with pulmonary exacerbation with acute exacerbation of bronchiectasis: [phone] insertion) on Symdeco. Lobectomy 03/29/2017 of RUL for concern contributing to recurrent infections in zone with decreased perfusion likely secondary to scarring. Worsening of symptoms over past few weeks, not responsive to outpatient antibiotic management (minocycline and ciprofloxacin).  Most recent cultures have grown smooth PsA, though history of stenotrophomonas and Mycobacterium abscessus infection. Patient HDS and afebrile, labs unremarkable. No focal consolidation on CXR. Last PFTs 06/20/17 with reduced FEV1 (58%) and FVC (65%) from pre-lobectomy baseline.  Started on IV tobramycin and zosyn for empiric coverage. Continued azithromycin and clofazimine for Mycobacterium abscessus coverage. RVP and RSV/rapid flu were negative.  Repeat PFTs remain at patient baseline. CRP non-elevated at presentation (9.7). Under went chest fluoroscopy (SNIFF test) which showed normal diaphragmatic excursion. Discharged to complete antibiotic course ***    CF induced PI: Continuing home Creon.   ??  HTN: Continue home Lisinopril and Carvedilol.    ??  History of DVT: Continue home Rivaroxaban.   ??  Chronic pansinusitis: Continue Flonase, Zyrtec, Singulair. Patient underwent CT sinuses that demonstrated ***. Marland Kitchen     Depressive disorder: Continue Lamictal and Vilazodone. Check QTc.   ??  Restless legs syndrome: Continue home pramipexole.   ??  Insomnia: Nightly trazodone.

## 2017-08-23 MED ORDER — MINOCYCLINE 100 MG CAPSULE
ORAL_CAPSULE | Freq: Two times a day (BID) | ORAL | 0 refills | 0 days | Status: CP
Start: 2017-08-23 — End: 2017-08-30

## 2017-08-23 MED ORDER — TOBRAMYCIN IVPB IN 100 ML (STANDARD DOSING) (OVF)
INTRAVENOUS | 0 refills | 0.00000 days | Status: CP
Start: 2017-08-23 — End: 2017-09-04

## 2017-08-23 NOTE — Unmapped (Signed)
Physician Discharge Summary    Identifying Information:   Christian Young  09-Nov-1985  [id number]    Admit date: 08/19/2017    Discharge date: 08/22/2017     Discharge Service: Pulmonology (MDG)    Discharge Attending Physician: Dr. Nyra Jabs    Discharge to: Home with Home Health and/or PT/OT    Discharge Diagnoses:  Principal Problem (Resolved):    Cystic fibrosis with pulmonary exacerbation (CMS-HCC)  Active Problems:    Essential hypertension    History of DVT (deep vein thrombosis)    Pancreatic insufficiency due to cystic fibrosis (CMS-HCC)    Mycobacterium abscessus infection  Resolved Problems:    Acute exacerbation of bronchiectasis (CMS-HCC)    Outpatient provider issues to follow-up:   [ ]  F/u CF sputum cultures (AFB, aerobic) pending at time of discharge   [ ]  Hospital follow-up appointment with Pulmonology requested for next two weeks  [ ]  Discuss timing of resuming Amikacin once current abx regimen is completed (tobramycin)     Hospital Course:   Christian Young??is a 32 y.o.??male??with PMHx significant for CF s/p RUL lobectomy 03/2017, CF related PI, HTN, DVT hx on Xarelto, depression and restless legs syndrome that presented to [facility] with Cystic fibrosis with pulmonary exacerbation   ??  Cystic fibrosis with pulmonary exacerbation with acute exacerbation of bronchiectasis:  [phone] insertion) on Symdeco. Lobectomy 03/29/2017 of RUL as there was concern that area was contributing to recurrent infections. Zone there with decreased perfusion likely secondary to scarring. Worsening of symptoms over past few weeks with increased cough, productive of purulent sputum, not responsive to outpatient antibiotic management (minocycline and ciprofloxacin). ??Most recent cultures have grown smooth PsA, though history of Stenotrophomonas and Mycobacterium abscessus??infection. Patient remained HDS and afebrile, with labs largely unremarkable. No focal consolidation on CXR. Last PFTs 06/20/17 with reduced FEV1 (58%) and FVC (65%) from pre-lobectomy baseline.  Started on IV tobramycin and zosyn for empiric coverage. Continued azithromycin and clofazimine for Mycobacterium abscessus coverage. RVP and RSV/rapid flu were negative.  Repeat PFTs remained at new patient baseline, 59.4%. CRP reassuring as non-elevated at presentation (9.7). Under went chest fluoroscopy (SNIFF test) which showed normal diaphragmatic excursion with regular breathing. Discharged to complete two week antibiotic course as noted above. To follow-up in outpatient Pulmonology clinic, requested at time of discharge.     Chronic pansinusitis: Continued Flonase, Zyrtec, Singulair. Patient underwent CT sinuses that demonstrated mucosal thickening of left maxillary sinus (mild), as well as unchanged leftward deviation of the nasal septum.   ??  CF induced PI: Continued home Creon.   ??  HTN: Continued home Lisinopril and Carvedilol. ??  ??  History of DVT: Continued home Rivaroxaban.   ??  Depressive disorder: Continued Lamictal and Vilazodone.  ??  Restless legs syndrome: Continued home pramipexole.   ??  Insomnia: Nightly trazodone.     Procedures:  None  No admission procedures for hospital encounter.  _____________________________________________________________________________  Discharge Day Services:  BP 148/79  - Pulse 102  - Temp 36.7 ??C (Oral)  - Resp 16  - Ht 182.9 cm (6')  - Wt (!) 119.9 kg (264 lb 4.8 oz)  - SpO2 96%  - BMI 35.85 kg/m??   Pt seen on the day of discharge and determined appropriate for discharge.    Condition at Discharge: good    Length of Discharge: I spent greater than 30 mins in the discharge of this patient.  _____________________________________________________________________________  Discharge Medications:     Your Medication  List      STOP taking these medications    amikacin 500 mg/2 mL injection  Commonly known as:  AMIKIN        START taking these medications    minocycline 100 MG capsule  Commonly known as: MINOCIN,DYNACIN  Take 1 capsule (100 mg total) by mouth Two (2) times a day. for 11 days  Start taking on:  08/23/2017     piperacillin-tazobactam 200 mg/mL injection  Commonly known as:  ZOSYN  Infuse 4.5 g into a venous catheter Every six (6) hours. for 12 days     tobramycin 480 mg, OVERFILL 10 mL in sodium chloride 0.9 % 100 mL IVPB  Infuse 480 mg into a venous catheter daily. for 11 days  Start taking on:  08/23/2017        CONTINUE taking these medications    albuterol 90 mcg/actuation inhaler  Commonly known as:  PROVENTIL HFA;VENTOLIN HFA  Inhale 2 puffs every six (6) hours as needed.     azithromycin 500 MG tablet  Commonly known as:  ZITHROMAX  DO NOT RESTART until discussed with primary pulmonologist (500mg  daily)     budesonide-formoterol 160-4.5 mcg/actuation inhaler  Commonly known as:  SYMBICORT  Inhale 2 puffs Two (2) times a day.     busPIRone 30 MG tablet  Commonly known as:  BUSPAR  Take 30 mg by mouth Two (2) times a day.     carvedilol 3.125 MG tablet  Commonly known as:  COREG  Take 1 tablet (3.125 mg total) by mouth Two (2) times a day.     cetirizine 10 MG tablet  Commonly known as:  ZyrTEC  Take 10 mg by mouth daily.     cholecalciferol (vitamin D3) 1,000 unit tablet  Take 1 tablet (1,000 Units total) by mouth daily.     CLOFAZIMINE ORAL  Take 200 mg by mouth daily.     dornase alfa 1 mg/mL nebulizer solution  Commonly known as:  PULMOZYME  Inhale 2.5 mg daily.     fluticasone 50 mcg/actuation nasal spray  Commonly known as:  FLONASE  2 sprays by Each Nare route daily.     lamoTRIgine 100 MG tablet  Commonly known as:  LaMICtal  Take 100 mg by mouth daily.     lipase-protease-amylase 36,000-114,000- 180,000 unit Cpdr  Commonly known as:  CREON  Take 7-8 capsules with meals and 5-6 capsules with snacks     lisinopril 10 MG tablet  Commonly known as:  PRINIVIL,ZESTRIL  Take 1 tablet (10 mg total) by mouth daily.     melatonin 3 mg Tab  Take 2 tablets (6 mg total) by mouth nightly. montelukast 10 mg tablet  Commonly known as:  SINGULAIR  Take 1 tablet (10 mg total) by mouth nightly.     MVW Complete (pediatric multivit 61-D3-vit K) 1,500-800 unit-mcg Cap  Take 1 capsule by mouth Two (2) times a day.     omeprazole 20 MG capsule  Commonly known as:  PriLOSEC  Take 20 mg by mouth daily.     PARI LC D NEBULIZER Misc  Generic drug:  nebulizers  Provide 1 device  for use with inhaled medication.     pramipexole 0.125 MG tablet  Commonly known as:  MIRAPEX  Take 0.25 mg by mouth daily.     pregabalin 300 MG capsule  Commonly known as:  LYRICA  Take 300 mg by mouth Two (2) times a day.     rivaroxaban 20  mg tablet  Commonly known as:  XARELTO  Take 20 mg by mouth daily with evening meal.     sodium bicarb-sodium chloride 700-2,300 mg Pack  Irrigate each nostril with 120 mL of solution (1 packet mixed with 240 mL of distilled water) BID     sodium chloride 0.9 % Soln  Commonly known as:  NS  Add 1mL NaCl into neb cup along with amikacin.     sodium chloride 7% 7 % Nebu  Inhale 4 mL by nebulization Two (2) times a day.     SYMDEKO tablet  Generic drug:  tezacaftor-ivacaftor  TAKE TWICE DAILY AS DIRECTED ON PACKAGE LABELING     syringe with needle 3 mL 20 gauge x 1 Syrg  Commonly known as:  SYRINGE 3CC/20GX1  For use with inhaled antibiotic     traMADol 50 mg tablet  Commonly known as:  ULTRAM  Take 100 mg by mouth every six (6) hours as needed for pain.     traZODone 100 MG tablet  Commonly known as:  DESYREL  Take 100 mg by mouth nightly.     VIIBRYD ORAL  Take 40 mg by mouth daily.          _____________________________________________________________________________  Pending Test Results (if blank, then none):   Order Current Status    AFB culture In process    CF Sputum/ CF Sinus Culture Preliminary result    Fungal Culture Preliminary result          Most Recent Labs:  Microbiology Results (last day)     Procedure Component Value Date/Time Date/Time    Fungal Culture [[id number]] Collected: 08/20/17 1530    Lab Status:  Preliminary result Specimen:  Sputum, CF from SPUTUM EXPECTORATED Updated:  08/22/17 1213     Fungal Pathogen Screen NEGATIVE TO DATE     Fungus Stain SPECIMEN TYPE UNACCEPTABLE FOR FUNGAL DIRECT    Narrative:       Specimen Source: SPUTUM EXPECTORATED    CF Sputum/ CF Sinus Culture [[id number]] Collected:  08/21/17 1445    Lab Status:  Preliminary result Specimen:  Sputum, CF from Sputum Updated:  08/22/17 1137     CF Sputum Culture Culture in Progress    Narrative:       Specimen Source: Sputum          Lab Results   Component Value Date    WBC 4.2 (L) 08/21/2017    HGB 11.8 (L) 08/21/2017    HCT 37.4 (L) 08/21/2017    PLT 283 08/21/2017       Lab Results   Component Value Date    NA 140 08/21/2017    K 4.0 08/21/2017    CL 105 08/21/2017    CO2 26.0 08/21/2017    BUN 14 08/21/2017    CREATININE 0.92 08/21/2017    CALCIUM 8.8 08/21/2017    MG 1.8 08/21/2017    PHOS 4.4 08/21/2017       Lab Results   Component Value Date    ALKPHOS 73 08/21/2017    BILITOT 0.3 08/21/2017    PROT 5.8 (L) 08/21/2017    ALBUMIN 3.6 08/21/2017    ALT 68 08/21/2017    AST 39 08/21/2017       Lab Results   Component Value Date    PT 12.9 (H) 08/19/2017    INR 1.13 08/19/2017     Hospital Radiology:  Xr Chest 2 Views    Result Date: 08/19/2017  EXAM: XR CHEST  2 VIEWS DATE: 08/19/2017 2:10 PM ACCESSION: [id number] UN DICTATED: 08/19/2017 2:11 PM INTERPRETATION LOCATION: [facility] CLINICAL INDICATION: 32 years old Male with CYSTIC FIBROSIS--  COMPARISON: Chest radiograph dated 05/30/2017. TECHNIQUE: PA and Lateral Chest Radiographs. FINDINGS: Unchanged right-sided Port-A-Cath with tip terminating in the right atrium. Unchanged sequelae of prior right lobectomy with mild persistent right hemidiaphragm elevation. Unchanged linear opacity within right-sided incidentally noted azygos fissure. No focal consolidation or pulmonary edema. Unchanged small right pleural effusion. New small left pleural effusion. No pneumothorax. Unremarkable cardiovascular silhouette.      Bilateral pleural effusions, new on the left side. Additional chronic findings as described above.    Ct Sinus Wo Contrast    Result Date: 08/22/2017  EXAM: Computed tomography, sinus without contrast material. DATE: 08/21/2017 11:23 PM ACCESSION: [id number] UN DICTATED: 08/21/2017 11:34 PM INTERPRETATION LOCATION: [facility] CLINICAL INDICATION: 32 years old Male with SINUSITISCHRONIC--  COMPARISON: CT sinus 12/28/2016 TECHNIQUE: Axial CT images through the paranasal sinuses without contrast. Coronal and sagittal reformatted images are provided. FINDINGS:  The patient is status-post bilateral maxillary antrectomies, uncinectomies and anterior ethmoidectomies. Mucosal thickening of the left maxillary sinus. Otherwise, the visualized paranasal sinuses are pneumatized and free of fluid.  No mucosal thickening or polypoid lesions.  There is no osseous dehiscence.  The visualized mastoid air cells are pneumatized.  The globes and visualized brain are unremarkable. Unchanged leftward deviation of the nasal septum.      -Sequelae of prior sinus surgery with mild mucosal thickening of the left maxillary sinus.    Fl Chest Fluoro    Result Date: 08/21/2017  EXAM: FL CHEST FLUORO DATE: 08/21/2017 8:37 AM ACCESSION: [id number] UN DICTATED: 08/21/2017 1:37 PM INTERPRETATION LOCATION: [facility] CLINICAL INDICATION: 32 years old Male with SNIFF Test to evaluate for diaphragmatic paralysis--  COMPARISON: Chest radiograph 08/19/2017 TECHNIQUE: Videofluoroscopy of the chest was performed throughout respiratory cycle and with sniff testing. Fluoroscopy time was 0.19 minutes using a low dose system with pulsed fluoroscopy at 7.5 frames per second. FINDINGS: Fluoroscopy of the chest and diaphragm showed: Elevation of the right hemidiaphragm with normal bilateral diaphragmatic excursion with regular breathing, sniffing, and deep breathing. No paradoxical motion.           Normal chest fluoroscopy.      _____________________________________________________________________________  Discharge Instructions:         Follow Up instructions and Outpatient Referrals     Ambulatory referral to Pulmonology       Please schedule patient for hospital follow-up with Dr. Koren Shiver in 2 weeks after antibiotics for CF exacerbation         Call MD for:       - Worsening cough and/or sputum production  - Worsening or persistent shortness of breath  - Bloody sputum         Call MD for:  difficulty breathing, headache or visual disturbances       Call MD for:  persistent dizziness or light-headedness       Call MD for:  temperature >38.5 Celsius       Discharge instructions       You were admitted to the hospital for an exacerbation of your cystic fibrosis. You were started on antibiotics and started to improve. You will continue on these once you return home for an additional 12 days.     You will be contacted by our schedulers about a hospital follow-up appointment with your lung doctors here at [facility].     If you have any questions or  concerns please don't hesitate to let us know. It has been a pleasure to participate in your care. Wishing you all the best in the New Year.         Referral to Home Infusion       Performing location?:  External    Home Health Requested Disciplines:  Nursing Comment - [facility]    **Please contact your service pharmacist for assistance with discharge home health infusion monitoring. Please see link above for Laboratory Monitoring Guidelines.     Home Health/Home Infusion Orders:     Agency Details:   Infusion Agency: [facility]  [phone]  [phone]  Infusion Start of Care Date: 08/22/17    Nursing Agency:[facility] [phone]  Fax: [phone]  Home Health Start of Care Date: 08/23/17    [facility] Skilled Nurse to teach patient/caregiver the administration of home infusion therapy as ordered.   Please teach/review the following, as applicable: how to flush and maintain IV line, medication bag change, storage/disposal of medication/supplies, and inventory control.  Monitor for signs and symptoms of infection, phlebitis, dislodgement/catheter migration, and occlusion of IV line. Instruct to keep IV site clean, dry and intact.   Review medication side effects.   Teach how to troubleshoot device/IV line function and who to call if dressing gets dirty, wet, or lifts off.   Review and provide the patient/caregiver with contact information regarding who to contact within your agency for questions, concerns or issues.    CVAD Dressing Change:  Please change dressing weekly/prn using sterile CVAD kit/transparent dressing.   Please change Biopatch with CVAD dressing change.   Change the stabilization device (STATLOCK) as applicable with each dressing change.  Please change needleless cap at least weekly with dressing change and prn, and after every blood draw, using aseptic technique.    Note - Generic substitutions permissible by Home Infusion agencies.    IV Access:  Line Type: Port-a-cath r chest                 Placement Date: 05/30/17    Special Instructions:     Flush Orders:  Port-a-Cath Adult Patient Port-a-Cath Adult Patient:   Please change [name] needle/ dressing weekly/prn. Flush with of Normal Saline before and after each use and prn line maintenance. Use after blood draws.   Flush with Heparin 100units/mL 2 mLs at the end of each use and prn line maintenance.  Please change needleless cap weekly and after blood draws, using aseptic technique.   Please access and flush unused Port with Heparin 100units/mL 2 mLs monthly.  If dual lumen Port, flush both sides.     Disciplines requested: Nursing and Home IV Antibiotics  ??  Physician to follow patient's care (the person listed here will be responsible for signing ongoing orders): [name]  ??  Start of Care Date: 08/22/17  Estimate completion of therapy: 1/7-1/21/19 (14 days of total)  ??  IV ANTIBIOTIC ORDERS:  -Tobramycin 480 mg IV every 24 hours (CF hi-dose, infuse over 60 minutes)-Please dose in the MORNING  -Piperacillin-tazobactam (Zosyn) 4.5g IV every 6 hours  ??  **DO NOT DRAW DRUG LEVELS FROM PORT**  ??  LAB MONITORING ORDERS:  ??  Start labs on 08/26/17  ??  Mon/Tue preferred for ONCE weekly labs  Mon and Thu preferred for TWICE weekly  ??  (Tobramycin Hi-dose Ext Interval and Ceftazidime/Cefepime/Aztreonam/Ceftaroline/Piperacillin-tazobactam): ONCE weekly: CBC with diff, tobramycin random level (7 hour after the END of infusion time); TWICE weekly: BUN,  SCr, Mg (may draw 2nd tobramycin level on 2nd lab draw of the week)  ??  TARGET DRUG LEVELS:  -Tobramycin (CF Hi-dose Extended Interval): Target end of Infusion peak: 20-30 mcg/mL; trough < 32mcg/mL; 12-hr random level < 2 mcg/mL  ??  **FAX RESULTS TO: **  CF Patient: [id number]  ??  *PHONE CONTACT*:  CF Patient: [phone]  ??               Appointments which have been scheduled for you    Aug 30, 2017  2:00 PM EST  (Arrive by 1:45 PM)  NEW  DIABETES with Denzil Magnuson, MD  [facility] DIABETES AND ENDOCRINOLOGY [facility] [facility]) [address]  [address]  [place] [address]  [phone]   Oct 23, 2017 12:00 PM EDT  (Arrive by 11:45 AM)  RETURN PFT 15 with [place] PFT 1  [facility] PULMONARY SPECIALTY FUNCT [place] [facility]) [address]  [address]  [place] [address]  [phone]   Oct 23, 2017 12:30 PM EDT  (Arrive by 12:15 PM)  RETURN CYSTIC FIBROSIS with [name], MD  [facility] PULMONARY SPECIALTY CL [place] [facility]) [address]  [address]  [place] [address]  [phone]

## 2017-08-26 MED ORDER — RIVAROXABAN 20 MG TABLET
ORAL_TABLET | Freq: Every day | ORAL | 3 refills | 0 days | Status: CP
Start: 2017-08-26 — End: 2018-08-26

## 2017-08-26 NOTE — Unmapped (Signed)
Pulmonary Clinic Note: Outpatient Lab Results from Monday, January 14th, 2019    Outpatient IV Antimicrobial Therapy:  Start Date: 08/19/17  End Date: 09/02/17 (D14)  Line: PORT  Referring pulmonologist: [name]    IV Antibiotics Ordered:  -Tobramycin 480mg  IV every 24 hours (CF hi-dose, infuse over 60 minutes)-Please dose in the MORNING  -Piperacillin-tazobactam (Zosyn) 4.5g IV every 6 hours    Oral/Inhaled Antibiotics:  -Minocycline 100mg  by mouth twice a day       GENERAL LABS: See Media Tab     [facility]   Home  1/14 Hospital  1/9   BUN  16 14   SCR  1.0 0.92   MG 1.7 1.8     CBC    Home  1/14 Hospital  1/9   WBC 5.7 4.2   HGB  12.3 11.8   HCT 36.6 37.4   PLT  269 283       DRUG LEVELS:  Measured Drug Levels:   -Tobramycin Random: 1.4 mcg/mL (Collected Date/Time: 01/14 @ 1815)     ----------------------------------------------------------------------------------------------------------------------  Patient was called to discuss start and stop time of Tobramycin on date of drug level collection.    Patient Reported Administration Time:   Start of infusion: 0930   End of infusion: 1030     PLAN     Delay in received labs, as BUN, Mg and Tobramycin random were add-ons.     1. Results note forwarded to: [name]  2. Drug levels forwarded to CPP Michel Santee, RN

## 2017-08-28 NOTE — Unmapped (Signed)
Outpatient Pharmacist Consult Note: Therapeutic Drug Level Monitoring    Christian Young is a 32 y.o. male on outpatient IV antibiotics for CF exacerbation.      Please see previous Documentation note by Norlene Duel, CF Nurse Coordinator on 08/26/17 for detailed lab results.    Outpatient IV Antimicrobial Therapy:  Start Date: 08/19/17  End Date: 09/02/17 (D14)    IV Antibiotics Ordered:  -Tobramycin 480mg  IV every 24 hours (CF hi-dose, infuse over 60 minutes)  -Piperacillin-tazobactam (Zosyn) 4.5g IV every 6 hours     Patient Parameters   Patient weight:   Wt Readings from Last 1 Encounters:   08/21/17 (!) 119.9 kg (264 lb 4.8 oz)     Drug Administration Time: 0930-1030     Goal Drug Levels:   -Tobramycin (CF Hi-dose Extended Interval): Target end of Infusion peak: 20-30 mcg/mL; trough < 92mcg/mL; 12-hr random level < 2 mcg/mL     Date andTime: Measured Drug Levels   08/26/17 1815: Tobramycin Random: 1.4 mcg/mL  Drawn 7.75 hour(s) after end of infusion time   08/22/17 0549: Tobramycin Peak: 17.1 mcg/mL Drawn 1 hour(s) after end of infusion time     Patient-Specific Pharmacokinetic Parameters:    Extrapolated end of infusion peak: 24.8 mcg/mL  Extrapolated trough: 0 mcg/mL    Vd = 16.2 L (0.14 L/kg), ke = 0.371 hr-1, half-life= 1.87 hr     Recommendations at this time unless clinically indicated otherwise:    -Current levels Therapeutic . Please note: Vd is below average 0.3-0.4 L/kg but clearing appropriately.  -Continue Tobramycin 480mg  IV every 24 hours CF hi-dose extended interval (infuse over 1 hour).    Labs:  -Next Labs Due on 08/29/17  -BUN/SCr  -Magnesium       [name], PharmD, BCPS, CPP  Clinical Pharmacist Practitioner  [facility] Adult Cystic Fibrosis [facility]  [facility]  Pager: [phone]    Consult note routed back to:   Lesia Sago, RN (CF)

## 2017-08-28 NOTE — Unmapped (Signed)
Spoke with Berta Minor, pharmacist at [facility] that patient is therapeutic on current dose and we will continue with same dose. Requested BUN, Mg, and Creatinine for Thursday's labs.

## 2017-08-30 NOTE — Unmapped (Signed)
Spoke with BJ, pharmacist with [facility] is going to continue on same dose of tobramycin through 09/09/17. Requested usual labs and tobramycin random on Monday or Tuesday, as Monday is a holiday. Script for an additional week of Minocycline to be sent to local pharmacy.

## 2017-08-30 NOTE — Unmapped (Signed)
.  Pulmonary Clinic Note: Outpatient Lab Results from Thursday, January 17th, 2019    Outpatient IV Antimicrobial Therapy:  Start Date: 08/20/17  End Date: 09/03/17 (D14)  End Date: 09/10/17 (D21)   Line: PORT  Referring pulmonologist: [name]    IV Antibiotics Ordered:  -Tobramycin 480mg  IV every 24 hours (CF hi-dose, infuse over 60 minutes)-Please dose in the MORNING  -Piperacillin-tazobactam (Zosyn) 4.5g IV every 6 hours    Oral/Inhaled Antibiotics:  -Minocycline 100mg  by mouth twice a day       GENERAL LABS: See Media Tab     CHEM     1/17 Home  1/14 Hospital  1/9   BUN  18 16 14    SCR  1.10 1.0 0.92   MG 1.8 1.7 1.8     CBC    Home  1/14 Hospital  1/9   WBC 5.7 4.2   HGB  12.3 11.8   HCT 36.6 37.4   PLT  269 283       DRUG LEVELS:  Measured Drug Levels:   -Tobramycin Random: 1.4 mcg/mL (Collected Date/Time: 01/14 @ 1815)     ----------------------------------------------------------------------------------------------------------------------  Patient was called to discuss start and stop time of Tobramycin on date of drug level collection.    Patient Reported Administration Time:   Start of infusion: 0930   End of infusion: 1030     PLAN     Patient left message and spoke with nurse coordinator on Friday, January 18th. Feels like he is feeling better but not where he would like to be. And is requesting to extend his therapy to a third week. Dr. Koren Shiver is in agreement with this plan.      1. Results note forwarded to: [name]  2. Drug levels forwarded to CPP Michel Santee, RN

## 2017-09-03 MED ORDER — MINOCYCLINE 100 MG CAPSULE
ORAL_CAPSULE | Freq: Two times a day (BID) | ORAL | 0 refills | 0 days | Status: CP
Start: 2017-09-03 — End: 2017-09-11

## 2017-09-03 NOTE — Unmapped (Signed)
Medication refill. Not medication protocol.

## 2017-09-04 MED ORDER — LISINOPRIL 10 MG TABLET
ORAL_TABLET | 0 refills | 0 days | Status: CP
Start: 2017-09-04 — End: 2017-11-21

## 2017-09-04 MED ORDER — TOBRAMYCIN IVPB IN 100 ML (STANDARD DOSING) (OVF)
Freq: Two times a day (BID) | INTRAVENOUS | 0 refills | 0 days
Start: 2017-09-04 — End: 2017-09-11

## 2017-09-04 MED ORDER — PIPERACILLIN-TAZOBACTAM 4.5 GRAM INTRAVENOUS SOLUTION
Freq: Four times a day (QID) | INTRAVENOUS | 0 refills | 0 days
Start: 2017-09-04 — End: 2017-09-11

## 2017-09-04 MED FILL — CARVEDILOL/3.125MG/TABS: CARVEDILOL/3.125MG/TABS | 30 days supply | Qty: 60 | Fill #1

## 2017-09-04 MED FILL — SYMDEKO/100-150/TBPK: SYMDEKO/100-150/TBPK | 28 days supply | Qty: 56 | Fill #2

## 2017-09-04 MED FILL — CREON/36000 UNITS/CPEP: CREON/36000 UNITS/CPEP | 16 days supply | Qty: 600 | Fill #2

## 2017-09-04 NOTE — Unmapped (Signed)
Spoke with Lorina Rabon, pharmacist with [facility]. Requested [name] to start on tobramycin 360mg  every 12 hours tomorrow morning. Requested labs (trough and peak) on Friday morning.

## 2017-09-04 NOTE — Unmapped (Signed)
.  Pulmonary Clinic Note: Outpatient Lab Results from Tuesday, January 22nd, 2019    Outpatient IV Antimicrobial Therapy:  Start Date: 08/20/17  End Date: 09/10/17 (D21)   Line: PORT  Referring pulmonologist: [name]    IV Antibiotics Ordered:  -Tobramycin 480mg  IV every 24 hours (CF hi-dose, infuse over 60 minutes)-Please dose in the MORNING  -Piperacillin-tazobactam (Zosyn) 4.5g IV every 6 hours    Oral/Inhaled Antibiotics:  -Minocycline 100mg  by mouth twice a day       GENERAL LABS: See Media Tab     CHEM     1/22   1/17 Home  1/14 Hospital  1/9   BUN  21 18 16 14    SCR  1.0 1.10 1.0 0.92   MG 1.9 1.8 1.7 1.8     CBC      1/22 Home  1/14 Hospital  1/9   WBC 5.0 5.7 4.2   HGB  12.5 12.3 11.8   HCT 39.4 36.6 37.4   PLT  324 269 283       DRUG LEVELS:  Measured Drug Levels:   -Tobramycin Random: <0.6 mcg/mL (Collected Date/Time: 01/22 @ 1750)       -----------------------------------------------------------------------------------------------------  Patient was called to discuss start and stop time of Tobramycin on date of drug level collection. Lab collected peripherally.     Patient Reported Administration Time:   Start of infusion: 1000   End of infusion: 1100     PLAN     1. Results note forwarded to: [name]  2. Drug levels forwarded to CPP Michel Santee, RN

## 2017-09-04 NOTE — Unmapped (Signed)
Addended by: Alton Revere T on: 09/04/2017 01:50 PM     Modules accepted: Orders

## 2017-09-04 NOTE — Unmapped (Signed)
Adult Cystic Fibrosis Clinic Pharmacist Note: Outpatient Home IV Antibiotic Lab Monitoring    Christian Young tobramycin 7 hr random level (after end of infusion) is undetectable. [name]'s drug-free interval is too long and puts him at risk for resistance and subtherapeutic treatment.    Recommend changing him to conventional dosing Q12H with close follow up to ensure he is free of otoxocity.    Please change dose to 360mg  IV every 12 hours, goal peak 10-14 mcg/mL and trough <1 mcg/mL, starting as soon as home infusion can get new dose to him.    Please check tobramycin peak (1 hr after end of infusion time) and trough (0.5-1 hr prior to start of dose) after 3rd-4th dose of this regimen.    [name], PharmD, BCPS, CPP  Clinical Pharmacist Practitioner  [facility]  [facility]  Pager: [phone]      CC:   Lesia Sago, RN

## 2017-09-04 NOTE — Unmapped (Signed)
[facility] Refill Coordination Note: Pulmonary         Christian Young, DOB: 05-06-1986    Medication Adherence    Patient Reported X Missed Doses in the Last Month:  0  Specialty Medication:  SYMDEKO QOH 1 WEEK   Patient is on additional specialty medications:  Yes  Additional specialty medications:  CREON 36000  QOH ABOUT 1 WEEK   Patient Reported Additional Medication X Missed Doses in the Last Month:  0  Patient is on more than two specialty medications:  No  Informant:  patient  Scientist, clinical (histocompatibility and immunogenetics) for Adherence:  family member  Confirmed Plan for Next Specialty Medication Refill:  delivery by pharmacy  Refills Needed for Supportive Medications:  not needed  Medication Assistance Program  Was Copay Assistance Provided:  No  Was Kennedy Bucker Assistance Provided:  No  Medication Program Assistance Provided(If Applicable):  NO HWG   Refill Coordination  Has the Patients' Contact Information Changed:  No    Is the Shipping Address Different:  No    Shipping Information  Delivery Scheduled:  Yes  Delivery Date:  09/05/17  Medications to be Shipped:      MEDICATIONS    -Symdeko 100/150mg  and 150mg   -Creon 36000  -CARVEDILOL                     -Confirmed the medication and dosage are correct and have not changed: Yes, regimen is correct and unchanged.             ADDITIONAL NOTES          N/A                     Jolene Schimke  Specialty Pharmacy Technician

## 2017-09-11 ENCOUNTER — Ambulatory Visit
Admit: 2017-09-11 | Discharge: 2017-09-11 | Payer: PRIVATE HEALTH INSURANCE | Attending: Pulmonary Disease | Primary: Pulmonary Disease

## 2017-09-11 ENCOUNTER — Ambulatory Visit: Admit: 2017-09-11 | Discharge: 2017-09-11 | Payer: PRIVATE HEALTH INSURANCE

## 2017-09-11 NOTE — Unmapped (Signed)
Pulmonary Cystic Fibrosis New Clinic Note      HISTORY:     Interval History:  Diagnosed at 32 with chronic infections, had consolidation and parapneumonic effusion,sent to [facility] for treatment since then.   [id number] and [id number] insertion. 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    - Lobectomy 03/29/2017, recently discharged from early 1/30 hospital admission with improvmeenti n symptoms with tx of IV tobra, pip-tazo, and oral mino   - Mild current constitutional symptoms (night sweats, not as bad or frequent as before), off inhaled amikacin sx due to being on IV tobra, will need to go back on this  - Does have some breathlessness when he lie flat since his surgery  - No current chest symoptoms  - He is using his HS with fair compliance  - Pulmozyme with fair compliance as well  - More compliant with clearance and he is more aware now of how important this is  - On teza/iva with normal  LFTs   - He states his stools are normal  - Still overweight, discussed need to improve exercise investment, currently only exercise is walking dog  - In the process of ordering new vest  - Not on insulin -- but soon to see endo for frequently elevated glucose on monitoring  - No issue with sinuses      Past Medical History:   Diagnosis Date   ??? Anxiety    ??? Chronic pain disorder    ??? Cystic fibrosis (CMS-HCC)    ??? Depression    ??? Hypertension      Past Surgical History:   Procedure Laterality Date   ??? PR REMOVAL OF LUNG,LOBECTOMY Right 03/29/2017    Procedure: REMOVAL OF LUNG, OTHER THAN PNEUMONECTOMY; SINGLE LOBE (LOBECTOMY);  Surgeon: Cherie Dark, MD;  Location: MAIN OR [facility];  Service: Thoracic       Other History:  The social history and family history were personally reviewed and updated in the patient's electronic medical record.    Home Medications:  Current Outpatient Prescriptions on File Prior to Visit   Medication Sig Dispense Refill   ??? albuterol (PROVENTIL HFA;VENTOLIN HFA) 90 mcg/actuation inhaler Inhale 2 puffs every six (6) hours as needed. 1 Inhaler 1   ??? azithromycin (ZITHROMAX) 500 MG tablet DO NOT RESTART until discussed with primary pulmonologist (500mg  daily) (Patient taking differently: Take 500 mg by mouth daily. ) 30 tablet 1   ??? budesonide-formoterol (SYMBICORT) 160-4.5 mcg/actuation inhaler Inhale 2 puffs Two (2) times a day.     ??? busPIRone (BUSPAR) 30 MG tablet Take 30 mg by mouth Two (2) times a day.     ??? carvedilol (COREG) 3.125 MG tablet Take 1 tablet (3.125 mg total) by mouth Two (2) times a day. 60 tablet 1   ??? cetirizine (ZYRTEC) 10 MG tablet Take 10 mg by mouth daily.     ??? cholecalciferol, vitamin D3, 1,000 unit tablet Take 1 tablet (1,000 Units total) by mouth daily. 30 tablet 11   ??? CLOFAZIMINE ORAL Take 200 mg by mouth daily.      ??? dornase alfa (PULMOZYME) 1 mg/mL nebulizer solution Inhale 2.5 mg daily. 225 mL 3   ??? fluticasone (FLONASE) 50 mcg/actuation nasal spray 2 sprays by Each Nare route daily. 16 g 0   ??? lamoTRIgine (LAMICTAL) 100 MG tablet Take 100 mg by mouth daily.     ??? lipase-protease-amylase (CREON) 36,000-114,000- 180,000 unit CpDR Take 7-8 capsules with meals and 5-6  capsules with snacks 600 capsule 11   ??? lisinopril (PRINIVIL,ZESTRIL) 10 MG tablet TAKE 1 TABLET BY MOUTH EVERY DAY 90 tablet 0   ??? melatonin 3 mg Tab Take 2 tablets (6 mg total) by mouth nightly.  0   ??? montelukast (SINGULAIR) 10 mg tablet Take 1 tablet (10 mg total) by mouth nightly. 30 tablet 1   ??? MVW Complete, pediatric multivit 61-D3-vit K, 1,500-800 unit-mcg cap Take 1 capsule by mouth Two (2) times a day. 30 capsule 0   ??? omeprazole (PRILOSEC) 20 MG capsule Take 20 mg by mouth daily.     ??? pramipexole (MIRAPEX) 0.125 MG tablet Take 0.25 mg by mouth daily.      ??? pregabalin (LYRICA) 300 MG capsule Take 300 mg by mouth Two (2) times a day.      ??? rivaroxaban (XARELTO) 20 mg tablet Take 1 tablet (20 mg total) by mouth daily with evening meal. 90 tablet 3   ??? sodium bicarb-sodium chloride 700-2,300 mg Pack Irrigate each nostril with 120 mL of solution (1 packet mixed with 240 mL of distilled water) BID     ??? sodium chloride 7% 7 % Nebu Inhale 4 mL by nebulization Two (2) times a day. 720 mL 3   ??? tezacaftor 100mg /ivacaftor 150mg  and ivacaftor 150mg  (SYMDEKO) tablets TAKE TWICE DAILY AS DIRECTED ON PACKAGE LABELING 56 tablet 3   ??? traMADol (ULTRAM) 50 mg tablet Take 100 mg by mouth every six (6) hours as needed for pain.     ??? traZODone (DESYREL) 100 MG tablet Take 100 mg by mouth nightly.     ??? vilazodone HCl (VIIBRYD ORAL) Take 40 mg by mouth daily.      ??? [DISCONTINUED] minocycline (MINOCIN,DYNACIN) 100 MG capsule Take 1 capsule (100 mg total) by mouth Two (2) times a day. for 7 days 14 capsule 0   ??? [DISCONTINUED] PARI LC D NEBULIZER Misc Provide 1 device  for use with inhaled medication. 3 each 5   ??? [DISCONTINUED] piperacillin-tazobactam (ZOSYN) 200 mg/mL injection Infuse 4.5 g into a venous catheter Every six (6) hours. for 6 days 1 each 0   ??? [DISCONTINUED] sodium chloride (NS) Soln Add 1mL NaCl into neb cup along with amikacin. 600 mL 11   ??? [DISCONTINUED] syringe with needle (SYRINGE 3CC/20GX1) 3 mL 20 gauge x 1 Syrg For use with inhaled antibiotic 60 Syringe 11   ??? [DISCONTINUED] tobramycin 360 mg, OVERFILL 10 mL in sodium chloride 0.9 % 100 mL IVPB Infuse 360 mg into a venous catheter every twelve (12) hours. for 6 days 1 each 0     No current facility-administered medications on file prior to visit.        Allergies:  Allergies as of 09/11/2017 - Reviewed 09/11/2017   Allergen Reaction Noted   ??? [place] [aztreonam lysine] Anaphylaxis 12/27/2016   ??? Cefepime Itching and Nausea Only 09/06/2015   ??? Slo-bid 100 Anaphylaxis 06/20/2017   ??? Banana Itching and Nausea And Vomiting 07/29/2016   ??? Tobramycin Tinnitus 09/06/2015       Review of Systems:  Ten systems were reviewed and were negative except as indicated in the above history.    CF Overview:     Genetics:  [id number] and [id number] insertion    Airway Clearance Regimen:  HS BID  Dornase    Inhaled ABX:  None    Hemoptysis: No  ABPA:  No  Ptx:  No  Sinusitus: No    Panc Insuf: Yes  PEG:  No  DIOS:  HAs bowel issues  CF Liver Dz: No    Diabetes: Borderline  Osteopenia: N/A    Depression: Yes    Other Co-morbidities:  N/A    Social Setting:  Lives with wife near [place]  Remarkably depressed    CF Monitoring:     Pulmonary Function History:  Date: FVC (% Pred) FEV1 (% Pred) FEV1/FVC FEF25-75(% Pred) ABX / Tx   11/19/2016 4.36 (75.3) 3.30 (69.8) 76 2.74 (57.9)    11/26/2016 4.41 (76.1) 3.25 (68.7) 74 2.31 (48.9)    12/24/2016 4.66 (80.6) 3.45 (73.1) 74 2.57 (54.3)    02/21/2017 5.16 (88.1) 3.54 (74.2) 69 1.95 (41.0)    06/2017 3.77 (65.3) 2.73 (58.1) 75.5 1.67 (35.7)    08/2017 3.65 (62.5) 2.64 (55.6) 72 1.79 (38.1)        Micro History:    MABSC  - Assumed S to [facility]ro [facility]  06/2017: MASBC not present, but M gordonae is     Smooth PsA  S: Cipro, Levo, Tobra  I: Aztreo, Cefe, Ceftaz, PT, LVX    Stenotrophamonas  R: Ceftat    MABSC (macrolide sensitive)  Last Negative Cx: 03/2017  Last Positive: 02/21/17      Exacerbation History:  Frequent from 2017 into 2018    BMI:   Body mass index is 38.33 kg/m??.    Last Sputum Cx: 12/2016  Last AFB:  12/2016    DEXA Scan:  Due  Result: --    Last IgE:  12/2016  Result: normal  Last LFT panel: 12/2016  Result: normal  Last Vit A/D/E: Due  Result: Due  Last INR:  12/2016  Result: normal  Last HbA1c:  6.1  Result: normal  Last OGTT:  Due  Result: Due      PHYSICAL EXAM:   BP 146/70  - Pulse 102  - Temp 36.7 ??C (Oral)  - Resp 16  - Ht 183 cm (6' 0.05)  - Wt (!) 128.4 kg (283 lb)  - SpO2 97% Comment: Resting on room air. Neb, CPAP, Vest - BMI 38.33 kg/m??   A/Ox3, flat affect  NCAT  EOMI, PER  Neck is supple without masses, No JVP  Membranes moist and no oral lesions  S1/S2, rrr, no m/r/g  Lungs CTA B/L over posterior lung fields and apices  Abd soft, NT, BS+  No visible skin breakdown, rashes, or lesions Moving all four extemities spontaneously, no tremor with movement, no ataxia or dysmetria  No pain on palpation of joint or tendons of left elbow  No LE edema B/L  No noted anxiety or depression      LABORATORY and RADIOLOGY DATA:     Pertinent Laboratory Data:  As above    Pertinent Imaging Data:  Elevate right diaphragm on recent chest imaging, seems to persist over several images    ASSESSMENT and PLAN     Christian Young is a 32 y.o. 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    Currently asymptomatic. Drop in PFT's is most likely due to elevated right hemidiaphragm s/p RU lobectomy, but does have normal video fluoroscopy of the diaphragm. Sharp reduction after surgery seems to have stabilized at this point.     As per previous discussions, patient has improved his self-care, including more frequent HS and dornase, but could improve exercise output -- this was discussed with patient.     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  Negative culture in 03/2017 and negative smear from this visit. The patient received 6 weeks of intravenous amikacin and imipenem in January 2018 into February 2019, and is on oral azithromycin, clofazimine, and inhaled amikacin. This were stopped momentarily due to concern for being on both inhaled amikacin and IV tobramycin, have rechecked cr which was at his baseline and had recently been only on clofaz and azithro due to IV tobra, notw that IV tobra has ceased will place patient back on full triple drug coverage. Needs audiology assessment.    For the patient's cystic fibrosis related sinus disease, currently having no sinus issues, on LTi.    The patient is on the border of having cystic fibrosis related diabetes, to see endo in the near future.    Needs Dexa Scan    Vitamin levels at next blood draw    Teza/iva LFT monitoring on schedule.    Wishes to re-enroll in PR, will order locally.    The patient was seen with Dr. Rosezella Florida.    Thurnell Garbe, PGY6  Pulmonary critical care fellow  [phone]    September 11, 2017

## 2017-09-11 NOTE — Unmapped (Signed)
Adult Cystic Fibrosis Clinic Pharmacist Note (Annual Review)     Primary pulmonologist: [name]    Christian Young is a 32 y.o. male with cystic fibrosis who presents to the pulmonary clinic for routine clinic follow up.     Patient reported medication discrepancies:  -Currently, inhaled amikacin on hold  -Completed IV antibiotics, but has a few more days of Zosyn b/c he missed a few doses    Patient reported adherence:   # of missed meds/week Meds most challenging Barriers to Adherence   Airway clearance regimen 0 n/a N/A   Chronic oral medications 0 n/a N/A     CFTR Genotype: [phone]    Sputum culture history: Smooth Pseudomonas aeruginosa, Stenotrophomonas maltophilia, OPF    Exacerbation history:  Number of exacerbations (2018): 7    Lung function:   Date: FEV1   09/11/17 55.6%   08/20/17 59.4%   06/20/17 58.1%     Medication Review   Reflects what the patient should be taking going forward from this review   Reviewed the indication, dose, and frequency of each medication with patient.   All medications, allergies, and preferred pharmacy list were updated in EPIC medication profile    CFTR Modulator Therapy: Symdeko: 1 tablet (TEZ 100mg /IVA150mg ) in the morning and 1 tablet (IVA) in the evening    Inhaled Medications:  Bronchodilator: Albuterol HFA Inhaler: BID  Mucolytic(s): Hypertonic Saline 7%: BID and Pulmozyme: Daily  Antibiotics: Inhaled amikacin 500mg  BID  Currently on cycle: N/A  Other: Symbicort 160-4.5: Inhale 2 puffs twice a day    ID:  Chronic /suppresive antibiotics: None  NTM: Azithromycin 500mg  daily, Clofazimine 200mg  daily  Last IV antibiotics: 08/2017 (Tobramycin, Zosyn 4.5g IV q6h)  Last PO antibiotics: 06/2017 (ciprofloxacin/minocycline)    FEN/GI:  Pancreatic enzymes: Creon 36,000 units: Take 8 capsules with meals and 5 capsules with snacks  Reflux/GERD: Omeprazole 20mg  daily  Vitamin(s): MVW Complete Softgels:1 capsule(s) BID and Vitamin D3 1000 units daily Other: None    Endocrine:  CFRD: Does not have CFRD  Bone health: None    Sinus-related/Allergy Medications:   Nasal irrigations per ENT  Montelukast 10mg  daily  Flonase Nasal Spray: 2 spray(s) in each nostril daily  Cetirizine 10mg  daily    ABPA: None    Neuro/Psych: vilazodone 40mg  daily, lamotrigine 100mg  daily  Insomnia: melatonin 6mg  QHS, trazodone 100mg  QHS and pramipexole 0.25mg  nightly (for RLS)    CV: lisinopril 10mg  daily, carvedilol 3.125mg  BID    Heme: rivaroxaban 20mg  daily with meals (anticoagulation)    Miscellaneous:  For pain: tramadol 100mg  TID and pregabalin 300mg  BID    Assessment/Recommendations     1. Cystic fibrosis with pulmonary involvement:   -Lung function slightly down from hospital discharge.  -Airway clearance as above. Much improvements in completing treatments since last May when I last saw him.  -No changes to therapy    2. FEN/GI:   -Current enzyme dose as above. No GI complaints or changes in stool.  -Reflux is not well-controlled, reports symptoms about 3x/week, recommend increasing omeprazole from 20mg  to 40mg  daily    3. ID/ABX:   -NTM (MAbsc): Last AFB 08/20/17 negative. Currently on clofazimine 200mg  daily, azithromycin 500mg  daily and inhaled amikacin on hold  -Lab monitoring: Last QTc (08/19/17), CMP WNL  -Recommend f/u with audiology, he's had multiple courses of IV aminoglycosides and also has been on inhaled amikacin/azithromycin. He has reported tinnitus, most recently when we switched his IV tobramycin to conventional dosing Q12H, intermittent  but not worse.     Medication management:  Adherence: Moderate compliance: Minor variances to doses prescribed, misses occasional doses during the week.  Access: No issues noted in obtaining medications.  Prescription Renewals: Requested refill/renewal orders routed to provider for review/authorization.      Pertinent drug Interactions noted: No   Recommended labs to be monitored: Yes-Audiology     CF Specialty Medications -Pulmozyme fills at [facility].   -Burnetta Sabin fills at [facility]    Enrolled in Copay Assistance Program(s):   - [employer] CF Treatment Kennedy Bucker: Active through 05/06/18  - [employer] CF Vitamins/Supplements Grant Active through 05/06/18  - Pulmozyme Copay Card  - Vertex Copay Program  - Creon CF Care Forward Program  - Symbicort Copay Card (eligible)  - Xarelto Copay Card (eligible)    Time spent with patient: 15 minutes    [name], PharmD, BCPS, CPP  Clinical Pharmacist Practitioner  [facility] Adult [facility]ystic Fibrosis [facility]

## 2017-09-11 NOTE — Unmapped (Addendum)
Thank you for coming in today, we will provide the following:    1) Audiology    2) Resume inhaled amikacin    3) We will refer you to pulmonary rehab    4) Increase your exercise    Thurnell Garbe, [place]  Pulmonary critical care fellow

## 2017-09-11 NOTE — Unmapped (Signed)
Outpatient Pharmacist Consult Note: Therapeutic Drug Level Monitoring    Beryl Balz is a 32 y.o. male on outpatient IV antibiotics for CF exacerbation.      Please see previous Documentation note by Norlene Duel, CF Nurse Coordinator on 09/10/17 for detailed lab results.    Outpatient IV Antimicrobial Therapy:  Start Date: 08/20/17  End Date: 09/10/17    IV Antibiotics Ordered:  -Tobramycin 360mg  IV every 12 hours (Conventional dosing)-assume 1 hr infusion since >300mg   -Piperacillin-tazobactam (Zosyn) 4.5g IV every 6 hours     Patient Parameters   Patient weight:   Wt Readings from Last 1 Encounters:   08/21/17 (!) 119.9 kg (264 lb 4.8 oz)     Drug Administration Time: 0900/2100     Goal Drug Levels:   -Tobramycin (Conventional Dosing): Target end of infusion peak: 10-14 mcg/mL, trough < 1 mcg/mL     Date andTime: Measured Drug Levels   09/08/17 1230: Tobramycin Peak: 7.6 mcg/mL Drawn 2.5 hour(s) after end of infusion time   09/07/17 1945: Tobramycin Trough: 1.0 mcg/mL  Drawn 1.25 hour(s) prior to start of infusion time     Patient-Specific Pharmacokinetic Parameters:    Extrapolated end of infusion peak: 14.1 mcg/mL  Extrapolated trough: 0.74 mcg/mL    Vd = 24 L (0.2 L/kg), ke = 0.246 hr-1, half-life= 2.82 hr     Recommendations at this time unless clinically indicated otherwise:    -Current levels Therapeutic .   -Patient has completed therapy as of yesterday. For future regimens, okay to dose conventional 360mg  IV every 12 hours for goal peak 10-14 mcg/mL and trough <1 mcg/mL.    Labs:  -No further follow up labs were ordered as patient due to complete day 21 of IV antibiotics on  09/10/17, will order labs if therapy extended       [name], PharmD, BCPS, CPP  Clinical Pharmacist Practitioner  [facility] Adult Cystic Fibrosis [facility]  [facility]  Pager: [phone]    Consult note routed back to:   Lesia Sago, RN (CF)

## 2017-09-11 NOTE — Unmapped (Signed)
.  Pulmonary Clinic Note: Outpatient Lab Results from Saturday, January 26nd, 2019    Outpatient IV Antimicrobial Therapy:  Start Date: 08/20/17  End Date: 09/10/17 (D21)   Line: PORT  Referring pulmonologist: [name]    IV Antibiotics Ordered:  -Tobramycin 360mg  IV every 12 hours (Conventional dosing, infuse over 30 minutes)  -Piperacillin-tazobactam (Zosyn) 4.5g IV every 6 hours    Oral/Inhaled Antibiotics:  -Minocycline 100mg  by mouth twice a day       GENERAL LABS: See Media Tab and Care Everywhere     CHEM     1/25 & 1/26   1/22   1/17 Home  1/14 Hospital  1/9   BUN  15 & 14 21 18 16 14    SCR  1.0 & 0.95 1.0 1.10 1.0 0.92   MG 1.6 1.9 1.8 1.7 1.8     CBC      1/25 & 1/26   1/22 Home  1/14 Hospital  1/9   WBC 5.3 & 4.9 5.0 5.7 4.2   HGB  12.8 & 12.4 12.5 12.3 11.8   HCT 39.8 & 38.3 39.4 36.6 37.4   PLT  299 & 265 324 269 283       DRUG LEVELS:  Measured Drug Levels:   -Tobramycin Peak: 7.6 mcg/mL (Collected Date/Time: 01/27 @ 1230)  -Tobramycin Trough: 1.0 mcg/mL (Collected Date/Time: 01/26 @ 1945 )     -----------------------------------------------------------------------------------------------------  Patient was called to discuss start and stop time of Tobramycin on date of drug level collection. Peak collected off of port.     Patient Reported Administration Time:   Start of infusion: 0900/2100   End of infusion:      PLAN     Has one last dose of tobramycin. Feeling well. Aware of appointment with Dr. Koren Shiver tomorrow.     1. Results note forwarded to: [name]  2. Drug levels forwarded to CPP Michel Santee, RN

## 2017-09-12 MED FILL — VITAMIN D-3/1000 U/: VITAMIN D-3/1000 U/ | 100 days supply | Qty: 100 | Fill #0

## 2017-09-12 NOTE — Unmapped (Signed)
Spoke to Lupita Leash at [facility] to give her [name]'s Pulmozyme copay card info and she confirmed it was added successfully.     BIN: [id number] (No group or PCN)  ZO:[id number]  Expires: 09/10/2018    I also obtained a copay card for his Symbicort:    BIN: [id number]  PCN: [id number]  GROUP: [id number]  ID: [id number]  Expires: 09/11/2018

## 2017-09-12 NOTE — Unmapped (Signed)
[facility]: Pharmacist Clinical Assessment Note: Adult Cystic Fibrosis    Completed clinical pharmacist assessment today with Bubba Camp in clinic.  CLINICAL ASSESSMENT   Reviewed the indication, dose, and frequency of each medication with patient.   All medications, allergies, and preferred pharmacy list were updated in EPIC medication profile     Drug-drug interactions reviewed: Yes  -Interaction identified: No    Review of systems:  Adverse Effects    *All other systems reviewed and are negative         Counseled patient on the following:   Adherence/missed doses  Cost of medications/cost implications  Doses and administration  Lab monitoring/follow-up  Pharmacy contact information  MEDICATION ASSISTANCE/FINANCIAL   Changes to insurance: No    MEDICATION REFILL COORDINATION   [facility] continues to manage refill coordination with patient.    Next clinic follow-up: 3 months  Next pharmacy clinical assessment due (in 6 months): 03/11/18    [name], PharmD, BCPS, CPP  Clinical Pharmacist Practitioner  [facility] / [facility]  Pager: [phone]

## 2017-09-16 MED ORDER — PROMETHAZINE 12.5 MG TABLET
ORAL_TABLET | Freq: Four times a day (QID) | ORAL | 0 refills | 0.00000 days | Status: CP | PRN
Start: 2017-09-16 — End: 2017-09-23

## 2017-09-16 MED ORDER — ONDANSETRON 4 MG DISINTEGRATING TABLET
ORAL_TABLET | Freq: Three times a day (TID) | ORAL | 0 refills | 0.00000 days | Status: CP | PRN
Start: 2017-09-16 — End: 2017-09-16

## 2017-09-16 NOTE — Unmapped (Signed)
[name] called the CF nurse coordinator office asking if I had seen his note in MyChart. Unfortunately, his note was routed only to his pulmonologist.     Patient is complaining of nausea, vomiting, and diarrhea. Notes that he is having a hard time keeping down solids but able to tolerate water. States the stools are frequent and loose, but couldn't say how many times he moved his bowels in the last 24 hours. He notes that maybe his stools smell different. No abdominal pain, but some cramping. States his mother and father have been sick, but not with GI complaints.     [facility]ing [facility] He notes that zofran give him a headache. Per chart notes, he has utilized compazine while in the hospital.     Routed to Dr. Koren Shiver.     [name], RN  CF Nurse Coordinator   [phone]

## 2017-09-19 NOTE — Unmapped (Signed)
I saw and evaluated the patient, participating in the key portions of the service.  I reviewed the resident???s note.  I agree with the resident???s findings and plan. Gracy Racer, MD

## 2017-09-26 MED ORDER — AZITHROMYCIN 500 MG TABLET
ORAL_TABLET | Freq: Every day | ORAL | 3 refills | 0.00000 days | Status: SS
Start: 2017-09-26 — End: 2018-01-01

## 2017-09-26 NOTE — Unmapped (Signed)
Refill requested.  [facility] LPN

## 2017-09-27 MED ORDER — CARVEDILOL 3.125 MG TABLET: tablet | 11 refills | 0 days

## 2017-09-27 MED ORDER — CARVEDILOL 3.125 MG TABLET
ORAL_TABLET | Freq: Two times a day (BID) | ORAL | 11 refills | 0.00000 days | Status: CP
Start: 2017-09-27 — End: 2017-09-27

## 2017-09-27 NOTE — Unmapped (Signed)
Patient messaged CF nurse coordinator:     So i feel like i am always contacting you guys with problems, but the side i had the surgery on has been hurting pretty bad these past couple days. So i was wondering what you guys thought it could be?     States that for the past four days, his site of surgery has been hurting really bad. The area is swollen, not warm, and had no fevers.  Spoke with Christian Young wife who states there is no redness or irritation to the skin, simply swollen so that it looks like a fat role.    Christian Young reports it is very tender to touch.  Has been taking OTC Naproxin, not has not been effective. Asking about other pain management.      Christian Young and his wife note that when Christian Young was first out of surgery, he had a bad rash in his axilla and it has also has reappeared now.     Denies injury or recent hard coughing, or muscle strain on that side.     Symptoms include:   Pulmonary-    Increased cough no   Productive cough no   Hemoptysis  no   Chest pain  yes   Chest tightness no   Fever  no   Chills  no   Nightsweats no   Change in appetite no   Other:  no        Current measures they are taken:   Able to perform regular AC & breathing treatments: no   Vest is tender on skin.    Requiring additional: no   Currently on inhaled abx: no    Routed to primary pulmonologist, Dr. Koren Shiver.     [name], RN  CF Nurse Coordinator   [phone]

## 2017-09-27 NOTE — Unmapped (Signed)
Discussed with Dr. Koren Shiver. He would like [name] to present to [facility] ED for evaluation/imaging. Relayed this advice to [place], verbalizes understanding.

## 2017-09-27 NOTE — Unmapped (Signed)
[facility] Refill Coordination Note: Pulmonary         Dillan Candela, DOB: 1986-02-26    Medication Adherence    Patient Reported X Missed Doses in the Last Month:  0  Specialty Medication:  CREON 36000   Patient is on additional specialty medications:  Yes  Additional specialty medications:  SYMDEKO   Patient Reported Additional Medication X Missed Doses in the Last Month:  0  Patient is on more than two specialty medications:  Yes  Specialty Medication:  AMIKACIN KIT   Informant:  patient  Support Network for Adherence:  family member  Confirmed Plan for Next Specialty Medication Refill:  delivery by pharmacy  Refills Needed for Supportive Medications:  yes, ordered or provider notified  Medication Assistance Program  Was [place] Assistance Provided:  Yes  Medication Program Assistance Provided(If Applicable):  [facility] ACTIVE   Refill Coordination  Has the Patients' Contact Information Changed:  No    Is the Shipping Address Different:  No    Shipping Information  Delivery Scheduled:  Yes  Delivery Date:  10/01/17  Medications to be Shipped:      MEDICATIONS    -Symdeko 100/150mg  and 150mg   -Inhaled amikacin 500mg  and kit: sodium chloride 0.9%, syringes with needles, Pari nebulizer cup, alcohol swabs  -Cholecalciferol (vitamin D3) 1,000 units  -Creon 36000  -CARVEDILOL                   -Confirmed the medication and dosage are correct and have not changed: Yes, regimen is correct and unchanged.      Drug Interactions    Clinically relevant drug interactions identified:  no            ADDITIONAL NOTES          SENT RQ FOR CARV RF                      Ledora Bottcher

## 2017-09-29 MED FILL — CREON/36000 UNITS/CPEP: CREON/36000 UNITS/CPEP | 16 days supply | Qty: 600 | Fill #3

## 2017-09-29 MED FILL — PARI LC PLUS NEBULIZER SET//MISC: PARI LC PLUS NEBULIZER SET//MISC | 30 days supply | Qty: 1 | Fill #4

## 2017-09-29 MED FILL — CARVEDILOL/3.125MG/TABS: CARVEDILOL/3.125MG/TABS | 30 days supply | Qty: 60 | Fill #0

## 2017-09-29 MED FILL — ALCOHOL PADS//: ALCOHOL PADS// | 50 days supply | Qty: 1 | Fill #4

## 2017-09-29 MED FILL — AMIKACIN SULFATE/500/2ML/SOLN: AMIKACIN SULFATE/500/2ML/SOLN | 30 days supply | Qty: 120 | Fill #4

## 2017-09-29 MED FILL — SYMDEKO/100-150/TBPK: SYMDEKO/100-150/TBPK | 28 days supply | Qty: 56 | Fill #3

## 2017-09-29 MED FILL — VITAMIN D-3/1000 U/: VITAMIN D-3/1000 U/ | 100 days supply | Qty: 100 | Fill #1

## 2017-09-29 MED FILL — BD IM 3mL/22G 1 INCH/SYR: BD IM 3mL/22G 1 INCH/SYR | 30 days supply | Qty: 60 | Fill #4

## 2017-09-29 MED FILL — NORMAL SALINE FLUSH/0.9%/SOLN: NORMAL SALINE FLUSH/0.9%/SOLN | 30 days supply | Qty: 60 | Fill #4

## 2017-10-08 ENCOUNTER — Ambulatory Visit
Admit: 2017-10-08 | Discharge: 2017-10-09 | Payer: PRIVATE HEALTH INSURANCE | Attending: "Endocrinology | Primary: "Endocrinology

## 2017-10-08 MED ORDER — PEN NEEDLE, DIABETIC 32 GAUGE X 5/32" (4 MM)
12 refills | 0 days | Status: SS
Start: 2017-10-08 — End: 2018-04-13

## 2017-10-08 MED ORDER — BLOOD-GLUCOSE METER
0 refills | 0 days | Status: SS
Start: 2017-10-08 — End: 2018-11-25

## 2017-10-08 MED ORDER — BLOOD SUGAR DIAGNOSTIC STRIPS
ORAL_STRIP | 12 refills | 0 days | Status: CP
Start: 2017-10-08 — End: 2018-04-25

## 2017-10-08 MED ORDER — LANCETS
12 refills | 0 days | Status: CP
Start: 2017-10-08 — End: 2018-10-08

## 2017-10-08 MED ORDER — INSULIN ASPART (U-100) 100 UNIT/ML (3 ML) SUBCUTANEOUS PEN
12 refills | 0 days | Status: SS
Start: 2017-10-08 — End: 2018-04-13

## 2017-10-08 MED ORDER — INSULIN DETEMIR (U-100) 100 UNIT/ML (3 ML) SUBCUTANEOUS PEN
12 refills | 0 days | Status: SS
Start: 2017-10-08 — End: 2018-04-13

## 2017-10-08 NOTE — Unmapped (Signed)
1. Check blood sugars each AM and PM  2. Start Levemir 5 units at night, adjust dose based on fasting blood sugar. If fasting blood sugars mostly >130, increase by 2 units every 3 days until fasting blood sugars are 80-130.   3. Start Novolog 4 units w evening meal    If see high blood sugars, can use Novolog as below  Add additional Novolog to the meal doses as needed based on following formula  201-250 +2  251-300 +4  301-350 +6  351-400 +8

## 2017-10-08 NOTE — Unmapped (Signed)
Internal Medicine Initial Visit      Assessment/Plan:     Patient is a 32 year old male with a PMH of CF, HTN and GAD/MDD who presents to establish care for CFRD.     #CFRD in the setting of CF, elevated Hgb A1c > 6.5% (7.6% in this patient) and blood glucoses in the 200-300's mg/dL, we will start insulin therapy.  --Detemir 5 u qHS with instructions to increase by 2 units q3d until blood glucose consistently between 80-130's mg/dL if readings initially consistently > 130 mg/dL  --Aspart 4 u w/ dinner  --Sliding scale if significantly elevated blood glucose (201-250 +2 - 251-300 +4 - 301-350 +6 - 351-400 +8)  --F/u in 4 weeks    Christian Young was seen today for diabetes mellitus.    Diagnoses and all orders for this visit:    CF (cystic fibrosis) (CMS-HCC)  -     Ambulatory referral to Endocrinology    Other orders  -     POCT Glucose  -     insulin detemir U-100 (LEVEMIR FLEXTOUCH U-100 INSULN) 100 unit/mL (3 mL) injection pen; Use up to 30 units per day, as per MD instructions  -     insulin ASPART (NOVOLOG FLEXPEN) 100 unit/mL injection pen; Use up to 30 units/day, divided TID AC meals as per MD instructions  -     pen needle, diabetic (BD ULTRA-FINE NANO PEN NEEDLE) 32 gauge x 5/32 Ndle; ok to sub any brand or size needle preferred by insurance/patient, use up to 4x/day, dx E 08.9  -     blood sugar diagnostic Strp; Dispense 150 blood glucose test strips, ok to sub any brand preferred by insurance/patient, use up to 5x/day, dx E 08.9  -     blood-glucose meter Misc; Dispense meter that is preferred by patient's insurance company  -     lancets Misc; Dispense 150 lancets, ok to sub any brand preferred by insurance/patient, use up to 5x/day, dx E08.9          Return in about 4 weeks (around 11/05/2017).      Chief Complaint:     Patient is a 32 year old male with a PMH of CF, HTN and GAD/MDD who presents to establish care for CFRD.     Subjective:     HPI  Patient is a 32 year old male with a PMH of CF, HTN and GAD/MDD who presents for a recurrence of CFRD.     The patient was initially treated for cystic fibrosis related diabetes (CFRD) when he received his care at [facility] with 5 units of levemir at night. Subsequently, after moving to [place], he was taken off of his diabetes medications which was approximately 3 years ago. Since then he has continued to check his blood sugar approximately three times per day. Recently, his blood sugars have been typically running 200's - 300's. He was scheduled for endocrinology assessment for CFRD and prior to today's appointment, his hemoglobin A1c was found to be 7.6%. Upon arrival to the clinic today, his POCT glucose was 306 mg/dL.     He denies any regular hypglycemic episodes. If he does not eat much, he reports that he will occasionally dip lower than usual (approxiamtely once per week his blood glucose will run <100 mg/dL and approximately once per month his blood glucose will be <60 mg/dL). He states that recently, however, the lowest he has seen is 160 mg/dL. He endorses occasional blurriness in his  vision and denies any neuropathic symptoms in his feet. He requires a new meter. His cystic fibrosis affects his lungs, sinuses and GI tract; he endorses pancreatic involvement. He does not appear malnourished. Most recently he was hospitalized in January 2019 for a CF-bronchiectasis exacerbation. He had a RUL lobectomy in August 2018. For his HTN, he takes lisinopril and carvedilol and he takes lamotrigine for his GAD and MDD.    The past medical history, surgical history, family history, social history, medications and allergies were reviewed in Epic.     Review of Systems    The balance of 10 systems was reviewed and unremarkable except as stated above.        Objective:     BP 133/79  - Pulse 111  - Ht 183 cm (6' 0.05)  - Wt (!) 124 kg (273 lb 4.8 oz)  - BMI 37.02 kg/m??     Gen: NAD  HEENT: Atraumatic, normocephalic, nares clear anteriorly, sclera anicteric, EOMI, MMM CV: Borderline tachycardic, regular rhythm, no rubs or gallops, S1/S2 normal  Pulm: LCTAB  Abd: Soft, non-distended, non-tender  Ext: Warm, well perfused, no edema noted in upper extremities  Neuro: Grossly no focal neurological deficits, gait normal  Psych: Appropriate mood and affect    Records Reviewed in [facility]

## 2017-10-08 NOTE — Unmapped (Signed)
No meter and pump downloaded. PP 1200 today. POCT glucose performed. A1c performed at [facility] 09/2017, result 7.6%. 306 mg/dL.

## 2017-10-09 NOTE — Unmapped (Signed)
I saw and evaluated the patient, participating in the key portions of the service.?? I reviewed the resident???s note.?? I agree with the resident???s findings and plan. Jabier Gauss, MD

## 2017-10-23 ENCOUNTER — Ambulatory Visit
Admit: 2017-10-23 | Discharge: 2017-10-23 | Payer: PRIVATE HEALTH INSURANCE | Attending: Pulmonary Disease | Primary: Pulmonary Disease

## 2017-10-23 ENCOUNTER — Ambulatory Visit: Admit: 2017-10-23 | Discharge: 2017-10-23 | Payer: PRIVATE HEALTH INSURANCE

## 2017-10-23 DIAGNOSIS — A310 Pulmonary mycobacterial infection: Principal | ICD-10-CM

## 2017-10-23 MED ORDER — AMIKACIN LIPOSOMAL 590 MG/8.4 ML SUSP FOR INHALATION, NEBULIZER ACCES.: mL | 11 refills | 0 days

## 2017-10-23 MED ORDER — OMEPRAZOLE 20 MG CAPSULE,DELAYED RELEASE
ORAL_CAPSULE | Freq: Every day | ORAL | 2 refills | 0 days | Status: CP
Start: 2017-10-23 — End: 2017-10-23

## 2017-10-23 MED ORDER — MONTELUKAST 10 MG TABLET
ORAL_TABLET | Freq: Every evening | ORAL | 3 refills | 0 days | Status: CP
Start: 2017-10-23 — End: 2018-04-16

## 2017-10-23 MED ORDER — ALBUTEROL SULFATE CONCENTRATE 2.5 MG/0.5 ML SOLUTION FOR NEBULIZATION
Freq: Four times a day (QID) | RESPIRATORY_TRACT | 3 refills | 0.00000 days | Status: SS | PRN
Start: 2017-10-23 — End: 2018-11-25

## 2017-10-23 MED ORDER — OMEPRAZOLE 40 MG CAPSULE,DELAYED RELEASE
ORAL_CAPSULE | Freq: Every day | ORAL | 3 refills | 0 days | Status: SS
Start: 2017-10-23 — End: 2018-04-07

## 2017-10-23 MED ORDER — AMIKACIN LIPOSOMAL 590 MG/8.4 ML SUSP FOR INHALATION, NEBULIZER ACCES.
Freq: Every day | RESPIRATORY_TRACT | 11 refills | 0.00000 days | Status: CP
Start: 2017-10-23 — End: 2017-10-23

## 2017-10-23 MED ORDER — CARVEDILOL 3.125 MG TABLET
ORAL_TABLET | Freq: Two times a day (BID) | ORAL | 3 refills | 0 days | Status: CP
Start: 2017-10-23 — End: 2018-10-01

## 2017-10-23 NOTE — Unmapped (Signed)
[facility] Refill Coordination Note  Specialty Medication(s): SYMDEKO 100-150  Christian Young, [place]: 31-Mar-1986  Phone: [phone] (home) , Alternate phone contact: N/A  Phone or address changes today?: No  All above HIPAA information was verified with patient.  Shipping Address: [address]  [place] [address]   Insurance changes? No    Completed refill call assessment today to schedule patient's medication shipment from the [facility] [phone]).      Confirmed the medication and dosage are correct and have not changed: Yes, regimen is correct and unchanged.    Confirmed patient started or stopped the following medications in the past month:  Reports stopping the following medications: PT STATES THAT DR MAY BE CHANGING HIM FROM AMIKACIN TO ARICKAYCE  THATS WHY HE DIDNT WANT FILLED AT THIS TIME    Are you tolerating your medication?:YES           [facility]    Did you miss any doses in the past 4 weeks? No missed doses reported.    FINANCIAL/SHIPPING    Delivery Scheduled: Yes, Expected medication delivery date: 031519     The patient will receive an FSI print out for each medication shipped and additional FDA Medication Guides as required.  Patient education from [place] or Christian Young may also be included in the shipment    Christian Young did not have any additional questions at this time.    Delivery address validated in FSI scheduling system: Yes, address listed in FSI is correct.    We will follow up with patient monthly for standard refill processing and delivery.      Thank you,  Christian Young   [facility] Specialty Technician

## 2017-10-24 MED ORDER — TEZACAFTOR 100 MG-IVACAFTOR 150 MG(DAY)/IVACAFTOR 150 MG(NITE) TABLETS: tablet | 3 refills | 0 days | Status: AC

## 2017-10-24 MED ORDER — TEZACAFTOR 100 MG-IVACAFTOR 150 MG(DAY)/IVACAFTOR 150 MG(NITE) TABLETS
ORAL_TABLET | ORAL | 3 refills | 0.00000 days | Status: CP
Start: 2017-10-24 — End: 2017-10-25

## 2017-10-24 MED FILL — LAMIRA NEBULIZER SYSTEM W/KIT//: LAMIRA NEBULIZER SYSTEM W/KIT// | 30 days supply | Qty: 1 | Fill #0

## 2017-10-24 MED FILL — SYMDEKO/100-150/TBPK: SYMDEKO/100-150/TBPK | 28 days supply | Qty: 56 | Fill #0

## 2017-10-24 MED FILL — ARIKAYCE 590MG/8.4 MLUD 28 VLS/590MG/8.4ML/BOX: ARIKAYCE 590MG/8.4 MLUD 28 VLS/590MG/8.4ML/BOX | 28 days supply | Qty: 1 | Fill #0

## 2017-10-24 NOTE — Unmapped (Signed)
[facility] Special[facility] Pharmacy Services: On-boarding Note Pulmonary Specialty: Adult Cystic Fibrosis     CPP reviewed information below in person during visit.  Also sent RX [facility]-day supply [facility]il[facility].  [facility] for Arikayce to [facility] for test claim and renewed Symdeko x 3 RFs to [facility].    October 24, 2017 11:41 AM  Christian Young is current [facility] but providing on-boarding for new Arikayce start in clinic yesterday.  Spoke to [facility] over 10/24/17 after test claim $0 and scheduled delivery.     Patient Info/Demographics:  DOB: 19-Dec-1985  Phone: [phone] (home)   Confirmed Shipping address: [address]  [place] [address]    Specialty Medication(s) to be sent:   -Arikayce (inhaled liposomal amikacin)    Other non-specialty medications to be sent:  -Lamira Nebulizer System       FINANCIAL:  Primary Billing: -[employer]  Secondary Billing: Copay Card  CF [facility] Active? Yes  Anticipated Copay: $0 (per test claim in [facility])     SHIPPING:   Scheduled Delivery Date: 10/25/17 from [facility] to above address  Signature Required: No     ______________________________________________________________________  Specialty Medication Counseling Provided to Patient:    INHALED LIPOSOMAL AMIKACIN (ARIKAYCE)  Used to treat people with who have non-tuberculosis mycobacterium (NTM) infection in their lungs caused by M. abscessus or M. avium.     Medication & Administration     Confirmed the medication and dosage:  Inhale 590mg  by nebulization once daily via the Lamira nebulizer    How Supplied:  ? Amikacin 590mg  per 8.31mL suspension for inhalation    Administration: Vial should be a room temperature for 45 minutes prior to use. Shake vial for at least 10-15 seconds until contents appear uniform and well mixed prior to opening.  ? Inhale contents of ampule sitting or standing upright and breathing normally through the mouthpiece of the Lamira nebulizer until there is no longer any mist being produced.    ? Usually last medication taken when on several inhaled therapies.    Missed dose instruction:   ? If it is close to your next dose then wait until the next dose and resume your normal dosing schedule. Do not take more than your normal dose to make up for a missed dose.     Instructions for storage:   ? Store amikacin vials under refrigeration; may be stored at room temperature for up to 4 weeks. You may remove 1-week supply of vials at a time.    Common Side Effects     ? Voice alterations,  loss of voice  ? Throat irritation  ? Bronchospasm     Warning and Precautions     Ototoxic: If you experience any hearing loss, change in your hearing (like ringing in your ears), or loss of balance, please contact your provider.    ______________________________________________________________________  Provided the [facility] pharmacy contact [phone], option 4    Answered all patient questions.  [facility] team will follow-up with patient monthly for standard refill processing and delivery of specialty medication.    Initiation of Therapy Assessment:   -Complete medication list, PMH, and allergies were reviewed.  No issues were identified  -Relevant cultural assessment and health literacy was completed.    -Patient is able to store his/her medication as directed  -This patient does not have any cognitive or physical disabilities   -This  patient does not requires interpreter services.    The following was explained to the patient:  -A Welcome packet will be sent to the patient   -Copay or out-of-pocket responsibility  -Arrangement of payment method can be done by contacting the pharmacy  -Take medications with during travel, have doctor's appointments, or if being admitted to the hospital.  -The patient will receive an FSI print out for each medication shipped and additional FDA Medication Guides as required.  Patient education from [employer] may also be included in the shipment.    Advised patient of refill order process:  -Emphasized need for patient to be reachable in order to schedule medication shipment.  -Pharmacy must speak to the patient every month to receive specialty medication  -Patient may call the pharmacy, or the pharmacy will call about a week before the refill is due    [name], PharmD, BCPS, CPP  Clinical Pharmacist Practitioner  [facility] Adult Cystic Fibrosis [facility]  [facility] Bronchiectasis [facility]  Pager: [phone]

## 2017-10-25 MED ORDER — TEZACAFTOR 100 MG-IVACAFTOR 150 MG(DAY)/IVACAFTOR 150 MG(NITE) TABLETS
ORAL | 3 refills | 0.00000 days | Status: CP
Start: 2017-10-25 — End: 2018-05-05

## 2017-10-25 MED ORDER — TEZACAFTOR 100 MG-IVACAFTOR 150 MG(DAY)/IVACAFTOR 150 MG(NITE) TABLETS: tablet | 3 refills | 0 days | Status: AC

## 2017-10-30 NOTE — Unmapped (Signed)
Pulmonary Cystic Fibrosis New Clinic Note      HISTORY:     Interval History:  Diagnosed at 81 with chronic infections, had consolidation and parapneumonic effusion,sent to [facility] for treatment since then.   [id number] and [id number] insertion. 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    - Lobectomy 03/29/2017, doing well currently, pain at incision not as bad as before  - No constitutional symptoms at this time, have waxed and waned hsitorically  - No current chest symoptoms  - He is using his HS with fair compliance  - Pulmozyme with fair compliance as well  - More compliant with clearance and he is more aware now of how important this is  - On teza/iva with normal  LFTs   - Exercising a bit more, still not at the level we would like  - He states his stools are normal  - Still overweight, discussed need to improve exercise investment, currently only exercise is walking dog  - In the process of ordering new vest  - Working with endo re: BG control  - No issue with sinuses      Past Medical History:   Diagnosis Date   ??? Anxiety    ??? Chronic pain disorder    ??? Cystic fibrosis (CMS-HCC)    ??? Depression    ??? Hypertension      Past Surgical History:   Procedure Laterality Date   ??? PR REMOVAL OF LUNG,LOBECTOMY Right 03/29/2017    Procedure: REMOVAL OF LUNG, OTHER THAN PNEUMONECTOMY; SINGLE LOBE (LOBECTOMY);  Surgeon: Cherie Dark, MD;  Location: MAIN OR [facility];  Service: Thoracic       Other History:  The social history and family history were personally reviewed and updated in the patient's electronic medical record.    Home Medications:  Current Outpatient Prescriptions on File Prior to Visit   Medication Sig Dispense Refill   ??? albuterol (PROVENTIL HFA;VENTOLIN HFA) 90 mcg/actuation inhaler Inhale 2 puffs every six (6) hours as needed. 1 Inhaler 1   ??? azithromycin (ZITHROMAX) 500 MG tablet Take 1 tablet (500 mg total) by mouth daily. 90 tablet 3   ??? blood sugar diagnostic Strp Dispense 150 blood glucose test strips, ok to sub any brand preferred by insurance/patient, use up to 5x/day, dx E 08.9 450 strip 12   ??? blood-glucose meter Misc Dispense meter that is preferred by patient's insurance company 1 each 0   ??? budesonide-formoterol (SYMBICORT) 160-4.5 mcg/actuation inhaler Inhale 2 puffs Two (2) times a day.     ??? busPIRone (BUSPAR) 30 MG tablet Take 30 mg by mouth Two (2) times a day.     ??? cetirizine (ZYRTEC) 10 MG tablet Take 10 mg by mouth daily.     ??? cholecalciferol, vitamin D3, 1,000 unit tablet Take 1 tablet (1,000 Units total) by mouth daily. 30 tablet 11   ??? CLOFAZIMINE ORAL Take 200 mg by mouth daily.      ??? dornase alfa (PULMOZYME) 1 mg/mL nebulizer solution Inhale 2.5 mg daily. 225 mL 3   ??? fluticasone (FLONASE) 50 mcg/actuation nasal spray 2 sprays by Each Nare route daily. 16 g 0   ??? insulin ASPART (NOVOLOG FLEXPEN) 100 unit/mL injection pen Use up to 30 units/day, divided TID AC meals as per MD instructions 30 mL 12   ??? insulin detemir U-100 (LEVEMIR FLEXTOUCH U-100 INSULN) 100 unit/mL (3 mL) injection pen Use up to 30 units per day, as per MD instructions 30  mL 12   ??? Lacto-Bif-Sac-Bacil-Strep-bact (MVW COMPLETE FORMUL PROBIOTIC) 40 billion cell -15 mg CpDR Take 1 capsule by mouth daily.     ??? lamoTRIgine (LAMICTAL) 100 MG tablet Take 100 mg by mouth daily.     ??? lancets Misc Dispense 150 lancets, ok to sub any brand preferred by insurance/patient, use up to 5x/day, dx E08.9 150 each 12   ??? lipase-protease-amylase (CREON) 36,000-114,000- 180,000 unit CpDR Take 7-8 capsules with meals and 5-6 capsules with snacks 600 capsule 11   ??? lisinopril (PRINIVIL,ZESTRIL) 10 MG tablet TAKE 1 TABLET BY MOUTH EVERY DAY 90 tablet 0   ??? melatonin 3 mg Tab Take 2 tablets (6 mg total) by mouth nightly.  0   ??? MVW Complete, pediatric multivit 61-D3-vit K, 1,500-800 unit-mcg cap Take 1 capsule by mouth Two (2) times a day. 30 capsule 0   ??? pen needle, diabetic (BD ULTRA-FINE NANO PEN NEEDLE) 32 gauge x 5/32 Ndle ok to sub any brand or size needle preferred by insurance/patient, use up to 4x/day, dx E 08.9 450 each 12   ??? pramipexole (MIRAPEX) 0.125 MG tablet Take 0.25 mg by mouth daily.      ??? pregabalin (LYRICA) 300 MG capsule Take 300 mg by mouth Two (2) times a day.      ??? rivaroxaban (XARELTO) 20 mg tablet Take 1 tablet (20 mg total) by mouth daily with evening meal. 90 tablet 3   ??? sodium bicarb-sodium chloride 700-2,300 mg Pack Irrigate each nostril with 120 mL of solution (1 packet mixed with 240 mL of distilled water) BID     ??? sodium chloride 7% 7 % Nebu Inhale 4 mL by nebulization Two (2) times a day. 720 mL 3   ??? traMADol (ULTRAM) 50 mg tablet Take 100 mg by mouth every six (6) hours as needed for pain.     ??? traZODone (DESYREL) 100 MG tablet Take 100 mg by mouth nightly.     ??? vilazodone HCl (VIIBRYD ORAL) Take 40 mg by mouth daily.        No current facility-administered medications on file prior to visit.        Allergies:  Allergies as of 10/23/2017 - Reviewed 10/23/2017   Allergen Reaction Noted   ??? [place] [aztreonam lysine] Anaphylaxis 12/27/2016   ??? Cefepime Itching and Nausea Only 09/06/2015   ??? Other Anaphylaxis and Other (See Comments) 09/06/2015   ??? Slo-bid 100 Anaphylaxis 06/20/2017   ??? Banana Itching and Nausea And Vomiting 07/29/2016   ??? Tobramycin Tinnitus 09/06/2015       Review of Systems:  Ten systems were reviewed and were negative except as indicated in the above history.    CF Overview:     Genetics:  [id number] and [id number] insertion    Airway Clearance Regimen:  HS BID  Dornase    Inhaled ABX:  None    Hemoptysis: No  ABPA:  No  Ptx:  No  Sinusitus: No    Panc Insuf: Yes  PEG:  No  DIOS:  HAs bowel issues  CF Liver Dz: No    Diabetes: Borderline  Osteopenia: N/A    Depression: Yes    Other Co-morbidities:  N/A    Social Setting:  Lives with wife near [place]  Remarkably depressed    CF Monitoring:     Pulmonary Function History:  Date: FVC (% Pred) FEV1 (% Pred) FEV1/FVC FEF25-75(% Pred) ABX / Tx   11/19/2016 4.36 (75.3) 3.30 (69.8) 76 2.74 (57.9)  11/26/2016 4.41 (76.1) 3.25 (68.7) 74 2.31 (48.9)    12/24/2016 4.66 (80.6) 3.45 (73.1) 74 2.57 (54.3)    02/21/2017 5.16 (88.1) 3.54 (74.2) 69 1.95 (41.0)    06/2017 3.77 (65.3) 2.73 (58.1) 75.5 1.67 (35.7)    08/2017 3.65 (62.5) 2.64 (55.6) 72 1.79 (38.1)        Micro History:    MABSC  - Assumed S to erythro from [facility]  06/2017: MASBC not present, but M gordonae is     Smooth PsA  S: Cipro, Levo, Tobra  I: Aztreo, Cefe, Ceftaz, PT, LVX    Stenotrophamonas  R: Ceftat    MABSC (macrolide sensitive)  Last Negative Cx: 03/2017  Last Positive: 02/21/17      Exacerbation History:  Frequent from 2017 into 2018    BMI:   Body mass index is 37 kg/m??.    Last Sputum Cx: 12/2016  Last AFB:  12/2016    DEXA Scan:  Due  Result: --    Last IgE:  12/2016  Result: normal  Last LFT panel: 12/2016  Result: normal  Last Vit A/D/E: Due  Result: Due  Last INR:  12/2016  Result: normal  Last HbA1c:  6.1  Result: normal  Last OGTT:  Due  Result: Due      PHYSICAL EXAM:   BP 130/59 (BP Site: L Arm, BP Position: Sitting, BP Cuff Size: Medium)  - Pulse 113  - Temp 36.8 ??C  - Resp 20  - Ht 183 cm (6' 0.05)  - Wt (!) 123.9 kg (273 lb 3.2 oz)  - SpO2 97%  - BMI 37.00 kg/m??   A/Ox3, flat affect  NCAT  EOMI, PER  Neck is supple without masses, No JVP  Membranes moist and no oral lesions  S1/S2, rrr, no m/r/g  Lungs CTA B/L over posterior lung fields and apices  Abd soft, NT, BS+  No visible skin breakdown, rashes, or lesions  Moving all four extemities spontaneously, no tremor with movement, no ataxia or dysmetria  No pain on palpation of joint or tendons of left elbow  No LE edema B/L  No noted anxiety or depression      LABORATORY and RADIOLOGY DATA:     Pertinent Laboratory Data:  As above    Pertinent Imaging Data:  Elevate right diaphragm on recent chest imaging, seems to persist over several images    ASSESSMENT and PLAN     Christian Young is a 32 y.o. 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    Currently asymptomatic. Drop in PFT's is most likely due to elevated right hemidiaphragm s/p RU lobectomy, but does have normal video fluoroscopy of the diaphragm. Sharp reduction after surgery seems to have stabilized at this point.     As per previous discussions, patient has improved his self-care, including more frequent HS and dornase, but could further improve exercise output -- this was discussed with patient.     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 Negative culture in 03/2017. The patient received 6 weeks of intravenous amikacin and imipenem in January 2018 into February 2019, and is on oral azithromycin, clofazimine, and inhaled amikacin. This were stopped momentarily due to concern for being on both inhaled amikacin and IV tobramycinNow back on triple therapy and will remain until 02/2019 if contiues to not grow MABSC    For the patient's cystic fibrosis related sinus disease, currently having  no sinus issues, on LTi.    Endo for BG control.    Needs Dexa Scan    Vitamin levels at next blood draw    Teza/iva LFT monitoring on schedule.    The patient was seen with Dr. Garner Nash.    Thurnell Garbe, PGY6  Pulmonary critical care fellow  [phone]    October 30, 2017

## 2017-11-14 MED ORDER — PREGABALIN 300 MG CAPSULE
ORAL_CAPSULE | Freq: Two times a day (BID) | ORAL | 0 refills | 0.00000 days | Status: CP
Start: 2017-11-14 — End: 2018-03-21

## 2017-11-14 NOTE — Unmapped (Signed)
[facility]      [facility]yst[facility]      Blaire Hodsdon Ellingson is a 32 y.o. male being followed by [facility] for his CF. SW alerted to My Chart message that [facility] sent Dr. Koren Shiver requesting 3-mo supply of his Lyrica due to concern for insurance lapse. SW left a voicemail for [place] this morning and also sent a My Chart message. CF SW will continue to follow.    Jerl Mina, LCSW  November 14, 2017 10:28 AM

## 2017-11-15 MED FILL — SYMDEKO/100-150/TBPK: SYMDEKO/100-150/TBPK | 28 days supply | Qty: 56 | Fill #1

## 2017-11-15 MED FILL — ARIKAYCE 590MG/8.4 MLUD 28 VLS/590MG/8.4ML/BOX: ARIKAYCE 590MG/8.4 MLUD 28 VLS/590MG/8.4ML/BOX | 28 days supply | Qty: 1 | Fill #1

## 2017-11-15 NOTE — Unmapped (Signed)
CF SW left another voicemail for [name] to [employer] loss of insurance on Monday, 4/8 (wife leaving her job). SW also sent [facility] and PAP applications to email address on record: [email or web address]. SW sent My Chart message yesterday, 4/4. SW will remain available for help and support.     Jerl Mina, LCSW  November 15, 2017 4:42 PM

## 2017-11-15 NOTE — Unmapped (Signed)
[facility] Refill Coordination Note: Adult Cystic Fibrosis    Christian Young, DOB: 05-11-1986  Phone: [phone] (home)   Shipping Address:   [address]  [place] [address]  All above HIPAA information was verified with patient.    Completed refill call assessment today to schedule patient's medication shipment from the [facility] [phone]).    Specialty medication(s) and dose(s) confirmed: Yes  Changes to medications: No  Changes to insurance: No  Tolerating medications: Yes    MEDICATION ADHERENCE     Medication Adherence    Patient reported X missed doses in the last month:  0  Specialty Medication:  ARIKAYCE  Patient is on additional specialty medications:  Yes  Additional Specialty Medications:  SYMDEKO  Patient Reported Additional Medication X Missed Doses in the Last Month:  0  Patient is on more than two specialty medications:  Yes  Specialty Medication:  CREON  Patient Reported Additional Medication X Missed Doses in the Last Month:  0  Any gaps in refill history greater than 2 weeks in the last 3 months:  no  Demonstrates understanding of importance of adherence:  yes  Informant:  patient  Reliability of informant:  reliable  Provider-estimated medication adherence level:  76-89%  Patient is at risk for Non-Adherence:  Yes  Reasons for non-adherence:  patient has problems affording medications   Other non-adherence reason:  WILL SOON LOSE INSURANCE FOR 3 MONTHS AFTER 11/18/17   Support network for adherence:  family member  Confirmed plan for next specialty medication refill:  delivery by pharmacy  Refills needed for supportive medications:  not needed       MEDICATION ASSISTANCE/FINANCIAL     Medication Assistance Program    Has Prior Authorization Been Obtained:  Yes  Was Copay Assistance Provided:  Yes  Has the Patient Been informed of Copay Responsibility:  Yes  Was Kennedy Bucker Assistance Provided:  Yes       CF [facility] Active? Yes DELIVERY SCHEDULING     Refill Coordination    Has the Patients' Contact Information Changed:  No  Is the Shipping Address Different:  No       Shipping Information    Delivery Scheduled:  Yes  Delivery Date:  11/20/1947  Medications to be Shipped:  -SYMDEKO  -ARIKAYCE  -Creon was filled at local pharmacy       The patient will receive an FSI print out for each medication shipped and additional FDA Medication Guides as required.  Patient education from [employer] may also be included in the shipment.    Additional Notes/Comments:  Patient notified CF team that his insurance will lapse Monday 11/18/17.  Insurance ends on Monday and they won't be available to get new insurance until 3 months from April 8th.         Next Refill Coordination Date: 01/15/18    [name], PharmD, BCPS, CPP  Clinical Pharmacist Practitioner  [facility] Adult Cystic Fibrosis [facility]   [facility]chiectasis [facility]  Pager: [phone]

## 2017-11-19 NOTE — Unmapped (Signed)
Adult Cystic Fibrosis Clinic Pharmacist Note: Insurance    Notified [employer] case manager (Adriana Blairst[place]) and Health visitor Marisue Ivan) of [place] Martin Maggard's change in insurance status. [name] should have 1 month supply as of today of Symdeko and Arikayce.  Per message from [place] last week, he will be w/o insurance for 3 months, so he'll likely need assistance 2-3 months supply.    [name], PharmD, BCPS, CPP  Clinical Pharmacist Practitioner  [facility] Adult Cystic Fibrosis Clinic  [facility] Bronchiectasis [facility]  Pager: [phone]      CC:   Lesia Sago, RN  Rosanne Sack, RN  Louretta Shorten (social work)  Turner Daniels (Pharmacy Specialist)

## 2017-11-20 NOTE — Unmapped (Signed)
[facility] Care      [facility] Cystic Fibrosis Center     Christian Young is a 32 y.o. male being followed by Christian Young for his CF. SW able to reach Christian Young this morning to touch base re: insurance lapse and need for ongoing coverage. This was the first conversation between Christian Young and this SW.     Christian Young confirmed receiving a 3-mo supply of his Lyrica and other medications, and picked up his one month supply of Symdeko and Arikayce yesterday, 4/9. He is aware of need for MAP coverage for these medications until he has active insurance again. CF Pharmacist/Christian Young following.    Christian Young shared that his wife began a new job with a transportation company in [place] on Monday, 4/8. She is also interviewing today for an EMT position. Christian Young stated that they will not be eligible for employer-based health insurance for three months (July 8). SW discussed [facility] and PAP programs in detail and referred Christian Young to applications sent to him via email last Friday. Christian Young to check his email today to confirm, and has this SW's contact information for any questions.  Christian Young has cancelled his CF clinic appointment due to lapse in insurance. SW reiterated importance of filling out [facility] application, in particular, given approximate one-month wait time for approval. Christian Young is aware that [facility] approval is retroactive.     SW also inquired about Christian Young's general financial well-being given job change. Christian Young shared that their church helped with the light bill, and that they are trying to stretch funds until his wife's next paycheck on April 26. Other bills fine for now. Christian Young and his wife reside with his parents currently. SW inquired about nutrition. Christian Young has endorsed food insecurity in the past and would like to be added back to clinic [facility] list for possible grocery assistance (he is aware that next drawing will be in August). He shared that he is not skipping meals, and that he and his wife will be grocery shopping for what they can today. He is open to SW sharing a list of local food pantries, which SW will send via email today. He is not aware whether his church or a local church in his area hosts a Programme researcher, broadcasting/film/video. In addition, Christian Young is aware of how to apply for [facility] and plans to do so online. He is also aware of [employer], which he used to obtain his 10-mo old therapy dog (standard poodle), Mia, five months ago. Christian Young shared that he has $500 remaining in [place] funds for this year. SW encouraged him to reach out to [place], even for a $100 grocery card, given quick turn-around time. SW also shared information about financial assistance through the [facility] and will send a link to [place] today.     Christian Young denies other psychosocial needs/concerns today. Plan made to f/u with [facility] by phone in one week. CF SW will remain available and will continue to follow.    Jerl Mina, LCSW  November 20, 2017 11:35 AM    CC: [name], MD  Norlene Duel  [place] Re[place]

## 2017-11-21 MED ORDER — LISINOPRIL 10 MG TABLET
ORAL_TABLET | 1 refills | 0 days | Status: SS
Start: 2017-11-21 — End: 2018-11-25

## 2017-11-27 NOTE — Unmapped (Signed)
CF SW left voicemail this afternoon to check in following conversation last week. See SW note from 4/10. Will attempt to reach [place] at another time.     Jerl Mina, LCSW  November 27, 2017 2:16 PM

## 2017-12-11 IMAGING — DX DG CHEST 1V PORT
1 series · 1 of 1 positions shown · non-contrast
Comparison: 11/05/2016.

CLINICAL DATA: Respiratory failure.

EXAM:
PORTABLE CHEST 1 VIEW

[chest ap]
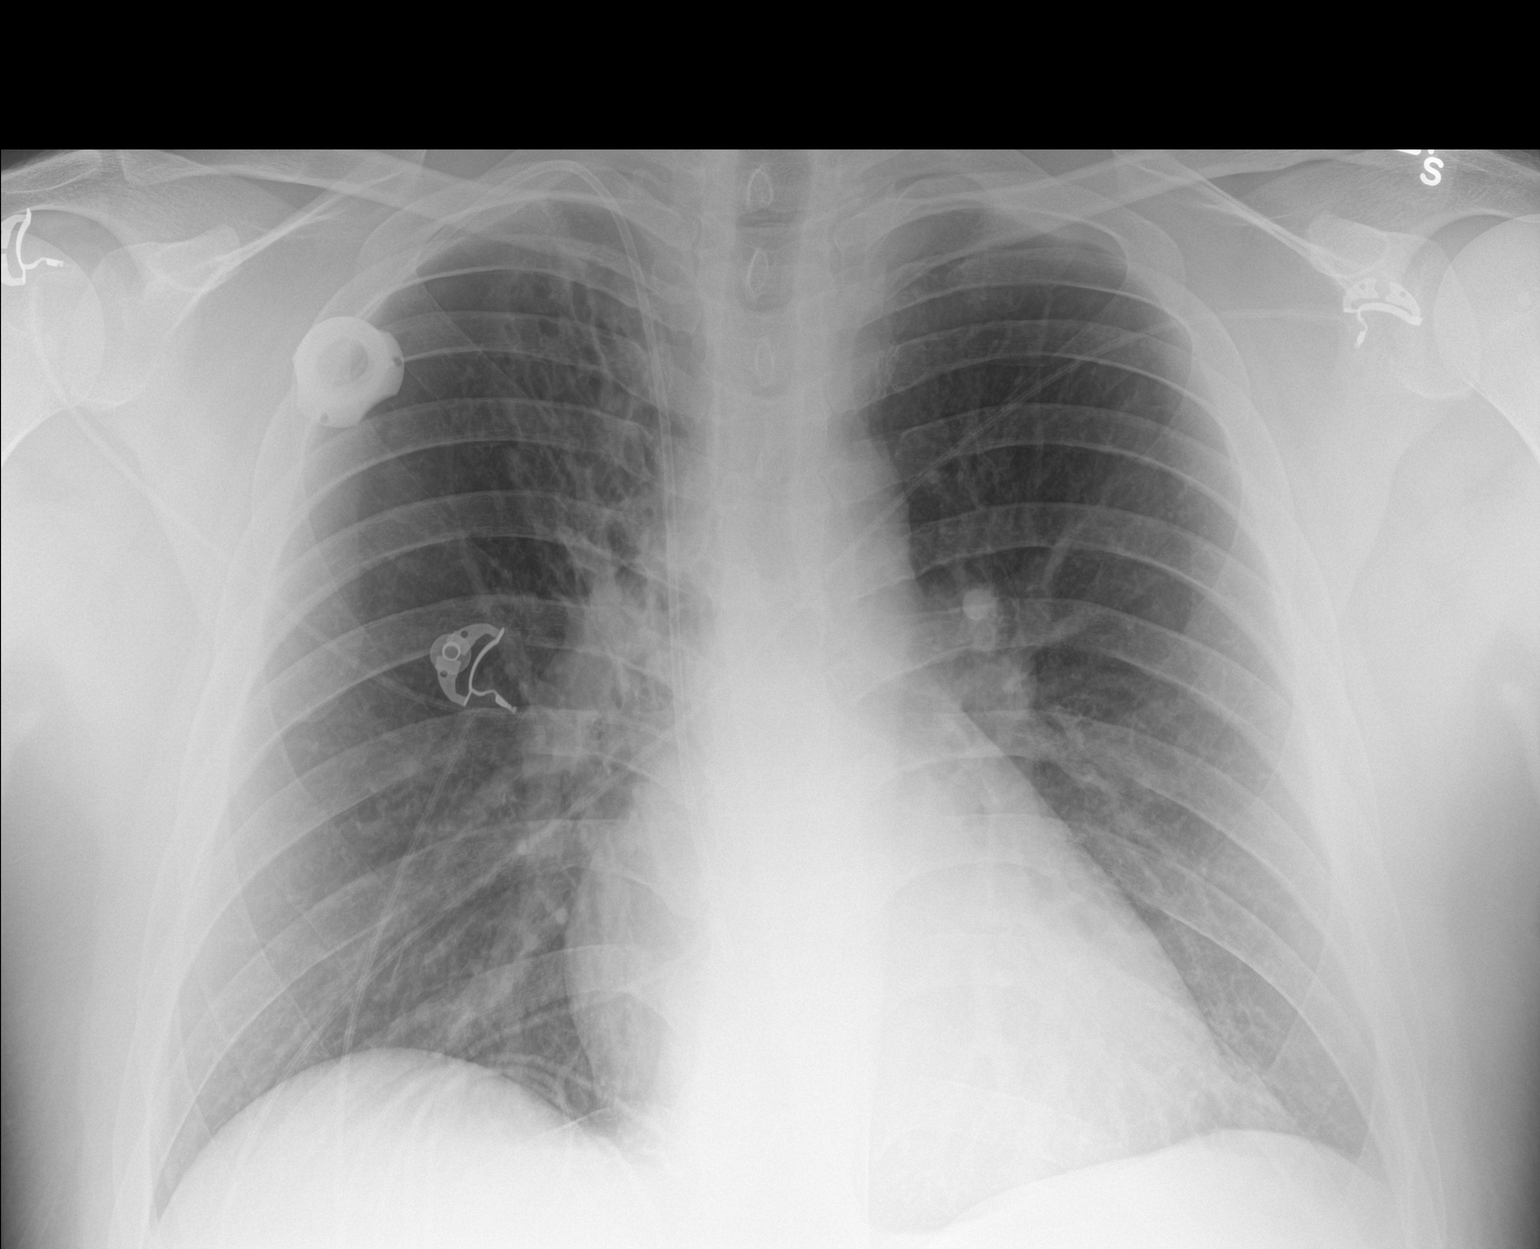

[1 of 1 positions shown; findings below may reference images not displayed]

FINDINGS: Port-A-Cath noted with tip projected over the right atrium.
Cardiomegaly with normal pulmonary vascularity. Stable apical
changes of bronchiectasis and scarring. No pleural effusion or
pneumothorax.
IMPRESSION: 1. Port-A-Cath noted with tip projected over right atrium.

2.  Stable right apical changes of bronchiectasis/ scarring .

3. Stable cardiomegaly .

## 2017-12-29 ENCOUNTER — Ambulatory Visit
Admit: 2017-12-29 | Discharge: 2018-01-08 | Disposition: A | Discharge status: Home / Self Care | Payer: PRIVATE HEALTH INSURANCE | Admitting: Internal Medicine

## 2017-12-29 DIAGNOSIS — R0602 Shortness of breath: Principal | ICD-10-CM

## 2017-12-29 LAB — BLOOD GAS CRITICAL CARE PANEL, VENOUS
BASE EXCESS VENOUS: 2.2 — ABNORMAL HIGH (ref -2.0–2.0)
GLUCOSE WHOLE BLOOD: 268 mg/dL
HCO3 VENOUS: 26 mmol/L (ref 22–27)
HEMOGLOBIN BLOOD GAS: 13.6 g/dL (ref 13.50–17.50)
LACTATE BLOOD VENOUS: 1.7 mmol/L (ref 0.5–1.8)
O2 SATURATION VENOUS: 66.6 % (ref 40.0–85.0)
PCO2 VENOUS: 42 mmHg (ref 40–60)
PH VENOUS: 7.41 (ref 7.32–7.43)
PO2 VENOUS: 37 mmHg (ref 30–55)
POTASSIUM WHOLE BLOOD: 4 mmol/L (ref 3.4–4.6)
SODIUM WHOLE BLOOD: 137 mmol/L (ref 135–145)

## 2017-12-29 LAB — COMPREHENSIVE METABOLIC PANEL
ALBUMIN: 4.6 g/dL (ref 3.5–5.0)
ALKALINE PHOSPHATASE: 90 U/L (ref 38–126)
ALT (SGPT): 76 U/L — ABNORMAL HIGH (ref 19–72)
ANION GAP: 15 mmol/L (ref 9–15)
AST (SGOT): 74 U/L — ABNORMAL HIGH (ref 19–55)
BILIRUBIN TOTAL: 0.5 mg/dL (ref 0.0–1.2)
BLOOD UREA NITROGEN: 19 mg/dL (ref 7–21)
BUN / CREAT RATIO: 28
CALCIUM: 9.4 mg/dL (ref 8.5–10.2)
CHLORIDE: 98 mmol/L (ref 98–107)
CO2: 24 mmol/L (ref 22.0–30.0)
CREATININE: 0.68 mg/dL — ABNORMAL LOW (ref 0.70–1.30)
EGFR MDRD NON AF AMER: >=60 mL/min/{1.73_m2} (ref >=60)
GLUCOSE RANDOM: 250 mg/dL — ABNORMAL HIGH (ref 65–179)
PROTEIN TOTAL: 7.5 g/dL (ref 6.5–8.3)
SODIUM: 137 mmol/L (ref 135–145)

## 2017-12-29 LAB — CBC W/ AUTO DIFF
BASOPHILS ABSOLUTE COUNT: 0 10*9/L (ref 0.0–0.1)
BASOPHILS RELATIVE PERCENT: 0.4 %
EOSINOPHILS ABSOLUTE COUNT: 0 10*9/L (ref 0.0–0.4)
HEMATOCRIT: 40.4 % — ABNORMAL LOW (ref 41.0–53.0)
HEMOGLOBIN: 13.5 g/dL (ref 13.5–17.5)
LARGE UNSTAINED CELLS: 2 % (ref 0–4)
LYMPHOCYTES ABSOLUTE COUNT: 2 10*9/L (ref 1.5–5.0)
LYMPHOCYTES RELATIVE PERCENT: 22.9 %
MEAN CORPUSCULAR HEMOGLOBIN CONC: 33.3 g/dL (ref 31.0–37.0)
MEAN CORPUSCULAR HEMOGLOBIN: 25.7 pg — ABNORMAL LOW (ref 26.0–34.0)
MEAN CORPUSCULAR VOLUME: 77 fL — ABNORMAL LOW (ref 80.0–100.0)
MEAN PLATELET VOLUME: 8.3 fL (ref 7.0–10.0)
MONOCYTES ABSOLUTE COUNT: 0.5 10*9/L (ref 0.2–0.8)
MONOCYTES RELATIVE PERCENT: 5.8 %
NEUTROPHILS RELATIVE PERCENT: 69.1 %
PLATELET COUNT: 366 10*9/L (ref 150–440)
RED BLOOD CELL COUNT: 5.24 10*12/L (ref 4.50–5.90)
RED CELL DISTRIBUTION WIDTH: 16.6 % — ABNORMAL HIGH (ref 12.0–15.0)
WBC ADJUSTED: 8.6 10*9/L (ref 4.5–11.0)

## 2017-12-29 LAB — SPECIMEN SOURCE

## 2017-12-29 LAB — PROTEIN TOTAL: Protein:MCnc:Pt:Ser/Plas:Qn:: 7.5

## 2017-12-29 LAB — NEUTROPHILS ABSOLUTE COUNT: Lab: 6

## 2017-12-29 NOTE — Unmapped (Signed)
Patient rounds completed. The following patient needs were addressed:  Pain, Toileting, Personal Belongings, Plan of Care, Call Bell in Reach and Bed Position Low.  Patient resting in stretcher in no acute distress, respirations even and unlabored.  All needs and concerns managed, stretcher in low locked position, call bell in reach.  Will continue to monitor.

## 2017-12-29 NOTE — Unmapped (Signed)
Patient placed on cardiac monitoring and pulse oximetry.

## 2017-12-29 NOTE — Unmapped (Signed)
[facility]  Emergency Department Provider Note      ED Clinical Impression     Final diagnoses:   None       Initial Impression, ED Course, Assessment and Plan     Impression: Christian Young is a 32 y.o. male with with past medical history of cystic fibrosis, s/p partial lobectomy, diabetes, pancreatic insufficiency, chronic pain, depression, hypertension, history of DVT on Xarelto who presents today with worsening fever and cough.  Patient tachycardic to 112, temperature 98.6, mildly hypertensive at 152/99, respiration rate 20, oxygen saturation 96%.  On physical exam patient is generally well-appearing, tachycardic, coarse breath sounds in right lung base, clear otherwise, + cough.   Differential diagnosis includes pneumonia, CF exacerbation, pneumothorax.  Low suspicion for PE as patient is on Xarelto, tachycardia has improved with fluids, patient has no unilateral extremity swelling or pain.    ED Course as of Dec 29 1700   Sun Dec 29, 2017   1502 WBC: 8.6   1503 HGB: 13.5   1503 Glucose(!): 250   1503 AST(!): 74   1503 ALT(!): 76   1503 pH, Venous: 7.41   1503 pCO2, Ven: 42       3:22 PM  Spoke with Pulmonology Fellow. She has some concern for CF exacerbation given symptoms. She said if patient feels as though he needs IV Abx then he is probably right.. Likely admit to Med G, if capped Med K. MAO has been paged.    Patient admitted. Antibiotic choice differed to admitting team.     Additional Medical Decision Making     I independently visualized the radiology images.   I reviewed the patient's prior medical records.   I discussed the case with the pulmonology fellow    Labs and radiology results that were available during my care of the patient were independently reviewed by me and considered in my medical decision making.    Portions of this record have been created using Scientist, clinical (histocompatibility and immunogenetics). Dictation errors have been sought, but may not have been identified and corrected. ____________________________________________    I have reviewed the triage vital signs and the nursing notes.     History     Chief Complaint  Cough      HPI   Christian Young is a 31 y.o. male with with past medical history of cystic fibrosis, s/p partial lobectomy, diabetes, pancreatic insufficiency, chronic pain, depression, hypertension, history of DVT on Xarelto who presents today with worsening fever and cough.  Patient states symptoms have been ongoing and slowly worsening for several weeks.  Dyspnea is worse on exertion and when laying flat.  Associated symptoms also include generalized malaise and body aches.  He reports T-max of 101.  He reports one episode hemoptysis yesterday (awoke with a blood lugieon his pillow.  He spoke with pulmonology on call last night who suggested he come to the emergency department.  Patient was last seen in cystic fibrosis clinic in March, states he missed his last appointment.  Patient takes 500 mg azithromycin daily.  Patient denies unilateral leg swelling or pain.  He denies GI, urinary, or cutaneous symptoms.      Past Medical History:   Diagnosis Date   ??? Anxiety    ??? Chronic pain disorder    ??? Cystic fibrosis (CMS-HCC)    ??? Depression    ??? Hypertension        Patient Active Problem List   Diagnosis   ???  Essential hypertension   ??? History of DVT (deep vein thrombosis)   ??? Depressive disorder, r/o major depression, recurrent, moderate/severe and dysthymic disorder   ??? Mood disorder (CMS-HCC)   ??? Anxiety   ??? Cystic fibrosis with intestinal manifestation (CMS-HCC)   ??? Chronic pansinusitis   ??? Pancreatic insufficiency due to cystic fibrosis (CMS-HCC)   ??? Mycobacterium abscessus infection   ??? Pseudomonas pneumonia (CMS-HCC)   ??? Anticoagulated   ??? Bronchiectasis (CMS-HCC)   ??? Chronic deep vein thrombosis (DVT) of lower extremity (CMS-HCC)   ??? Cystic fibrosis with pulmonary exacerbation (CMS-HCC)       Past Surgical History:   Procedure Laterality Date   ??? PR REMOVAL OF LUNG,LOBECTOMY Right 03/29/2017    Procedure: REMOVAL OF LUNG, OTHER THAN PNEUMONECTOMY; SINGLE LOBE (LOBECTOMY);  Surgeon: Cherie Dark, MD;  Location: MAIN OR [facility];  Service: Thoracic       No current facility-administered medications for this encounter.     Current Outpatient Medications:   ???  albuterol (PROVENTIL HFA;VENTOLIN HFA) 90 mcg/actuation inhaler, Inhale 2 puffs every six (6) hours as needed., Disp: 1 Inhaler, Rfl: 1  ???  albuterol 2.5 mg/0.5 mL nebulizer solution, Inhale 0.5 mL (2.5 mg total) by nebulization every six (6) hours as needed for wheezing., Disp: 90 each, Rfl: 3  ???  amikacin liposomal-neb.accessr (ARIKAYCE) 590 mg/8.4 mL NbSp, Inhale 590 mg daily., Disp: 30 vial, Rfl: 11  ???  azithromycin (ZITHROMAX) 500 MG tablet, Take 1 tablet (500 mg total) by mouth daily., Disp: 90 tablet, Rfl: 3  ???  blood sugar diagnostic Strp, Dispense 150 blood glucose test strips, ok to sub any brand preferred by insurance/patient, use up to 5x/day, dx E 08.9, Disp: 450 strip, Rfl: 12  ???  blood-glucose meter Misc, Dispense meter that is preferred by [employer], Disp: 1 each, Rfl: 0  ???  budesonide-formoterol (SYMBICORT) 160-4.5 mcg/actuation inhaler, Inhale 2 puffs Two (2) times a day., Disp: , Rfl:   ???  busPIRone (BUSPAR) 30 MG tablet, Take 30 mg by mouth Two (2) times a day., Disp: , Rfl:   ???  carvedilol (COREG) 3.125 MG tablet, Take 1 tablet (3.125 mg total) by mouth Two (2) times a day., Disp: 180 tablet, Rfl: 3  ???  cetirizine (ZYRTEC) 10 MG tablet, Take 10 mg by mouth daily., Disp: , Rfl:   ???  cholecalciferol, vitamin D3, 1,000 unit tablet, Take 1 tablet (1,000 Units total) by mouth daily., Disp: 30 tablet, Rfl: 11  ???  CLOFAZIMINE ORAL, Take 200 mg by mouth daily. , Disp: , Rfl:   ???  dornase alfa (PULMOZYME) 1 mg/mL nebulizer solution, Inhale 2.5 mg daily., Disp: 225 mL, Rfl: 3  ???  fluticasone (FLONASE) 50 mcg/actuation nasal spray, 2 sprays by Each Nare route daily., Disp: 16 g, Rfl: 0  ??? insulin ASPART (NOVOLOG FLEXPEN) 100 unit/mL injection pen, Use up to 30 units/day, divided TID AC meals as per MD instructions, Disp: 30 mL, Rfl: 12  ???  insulin detemir U-100 (LEVEMIR FLEXTOUCH U-100 INSULN) 100 unit/mL (3 mL) injection pen, Use up to 30 units per day, as per MD instructions, Disp: 30 mL, Rfl: 12  ???  Lacto-Bif-Sac-Bacil-Strep-bact (MVW COMPLETE FORMUL PROBIOTIC) 40 billion cell -15 mg CpDR, Take 1 capsule by mouth daily., Disp: , Rfl:   ???  lamoTRIgine (LAMICTAL) 100 MG tablet, Take 100 mg by mouth daily., Disp: , Rfl:   ???  lancets Misc, Dispense 150 lancets, ok to sub any brand preferred  by insurance/patient, use up to 5x/day, dx E08.9, Disp: 150 each, Rfl: 12  ???  lipase-protease-amylase (CREON) 36,000-114,000- 180,000 unit CpDR, Take 7-8 capsules with meals and 5-6 capsules with snacks, Disp: 600 capsule, Rfl: 11  ???  lisinopril (PRINIVIL,ZESTRIL) 10 MG tablet, TAKE 1 TABLET BY MOUTH EVERY DAY, Disp: 90 tablet, Rfl: 1  ???  melatonin 3 mg Tab, Take 2 tablets (6 mg total) by mouth nightly., Disp: , Rfl: 0  ???  montelukast (SINGULAIR) 10 mg tablet, Take 1 tablet (10 mg total) by mouth nightly., Disp: 90 tablet, Rfl: 3  ???  MVW Complete, pediatric multivit 61-D3-vit K, 1,500-800 unit-mcg cap, Take 1 capsule by mouth Two (2) times a day., Disp: 30 capsule, Rfl: 0  ???  omeprazole (PRILOSEC) 40 MG capsule, Take 1 capsule (40 mg total) by mouth daily., Disp: 90 capsule, Rfl: 3  ???  pen needle, diabetic (BD ULTRA-FINE NANO PEN NEEDLE) 32 gauge x 5/32 Ndle, ok to sub any brand or size needle preferred by insurance/patient, use up to 4x/day, dx E 08.9, Disp: 450 each, Rfl: 12  ???  pramipexole (MIRAPEX) 0.125 MG tablet, Take 0.25 mg by mouth daily. , Disp: , Rfl:   ???  pregabalin (LYRICA) 300 MG capsule, Take 1 capsule (300 mg total) by mouth Two (2) times a day., Disp: 180 capsule, Rfl: 0  ???  rivaroxaban (XARELTO) 20 mg tablet, Take 1 tablet (20 mg total) by mouth daily with evening meal., Disp: 90 tablet, Rfl: 3 ???  sodium bicarb-sodium chloride 700-2,300 mg Pack, Irrigate each nostril with 120 mL of solution (1 packet mixed with 240 mL of distilled water) BID, Disp: , Rfl:   ???  sodium chloride 7% 7 % Nebu, Inhale 4 mL by nebulization Two (2) times a day., Disp: 720 mL, Rfl: 3  ???  tezacaftor 100mg /ivacaftor 150mg  and ivacaftor 150mg  (SYMDEKO) tablets, Take 1 tablet (tezacaftor 100mg /ivacaftor 150mg ) by mouth every AM and 1 tablet (ivacaftor 150mg ) every PM with fatty food, Disp: 56 tablet, Rfl: 3  ???  traMADol (ULTRAM) 50 mg tablet, Take 100 mg by mouth every six (6) hours as needed for pain., Disp: , Rfl:   ???  traZODone (DESYREL) 100 MG tablet, Take 100 mg by mouth nightly., Disp: , Rfl:   ???  vilazodone HCl (VIIBRYD ORAL), Take 40 mg by mouth daily. , Disp: , Rfl:     Allergies  Cayston [aztreonam lysine]; Cefepime; Other; Slo-bid 100; Banana; and Tobramycin    Family History   Problem Relation Age of Onset   ??? Bipolar disorder Mother    ??? Depression Mother        Social History  Social History     Tobacco Use   ??? Smoking status: Never Smoker   ??? Smokeless tobacco: Never Used   Substance Use Topics   ??? Alcohol use: No   ??? Drug use: No       Review of Systems    All other systems have been reviewed and are negative except as otherwise documented.    Physical Exam     ED Triage Vitals [12/29/17 1304]   Enc Vitals Group      BP 152/99      Heart Rate 112      SpO2 Pulse       Resp 20      Temp 37 ??C (98.6 ??F)      Temp src       SpO2 96 %  Weight       Height       Head Circumference       Peak Flow       Pain Score       Pain Loc       Pain Edu?       Excl. in GC?        Constitutional: Alert and oriented. Well appearing and in no distress.  Eyes: Conjunctivae are normal.  ENT       Head: Normocephalic and atraumatic.       Nose: No congestion.       Mouth/Throat: Mucous membranes are moist.       Neck: No stridor.  Cardiovascular: borderline tachycardia, regular rhythm. Normal and symmetric distal pulses are present in all extremities.  Respiratory: Normal respiratory effort. Coarse breath sounds at right lung base  Gastrointestinal: Soft and nontender.   Musculoskeletal: Normal range of motion in all extremities.       Right lower leg: No tenderness or edema.       Left lower leg: No tenderness or edema.  Neurologic: Normal speech and language. No gross focal neurologic deficits are appreciated.  Skin: Skin is warm, dry and intact. No rash noted. Well healed scar in right thorax   Psychiatric: Mood and affect are normal. Speech and behavior are normal.    EKG     none    Radiology   Xr Chest 2 Views    Result Date: 12/29/2017  EXAM: CHEST TWO VIEW DATE: 12/29/2017 1529 ACCESSION: [id number] UN DICTATED: 12/29/2017 3:37 PM INTERPRETATION LOCATION: [facility]     CLINICAL INDICATION: 32 years old Male with COUGH--      COMPARISON: 08/19/2017     TECHNIQUE:PA and lateral views of the chest.     FINDINGS:     Right chest port catheter tip terminates over the lower right atrium.     No lung volumes.     Unchanged linear opacity within the right upper lobe.     No consolidation or pulmonary edema.     No pneumothorax or pleural effusion.     Cardiomediastinal silhouette within normal limits.     No acute osseous abnormality.             No acute airspace disease.        Procedures     Procedure(s) performed: None.           Luther Redo, MD  Resident  12/29/17 [phone]

## 2017-12-29 NOTE — Unmapped (Signed)
Patient returned from X-ray  Transported by Radiology  How tranported Wheelchair  Cardiac Monitor yes

## 2017-12-29 NOTE — Unmapped (Signed)
Patient transported to X-ray  Transported by Radiology  How tranported Wheelchair  Cardiac Monitor yes

## 2017-12-29 NOTE — Unmapped (Signed)
Medicine G History and Physical    Assessment/Plan:    Principal Problem:    Cystic fibrosis with pulmonary exacerbation (CMS-HCC)  Active Problems:    Essential hypertension    History of DVT (deep vein thrombosis)    Mood disorder (CMS-HCC)    Pancreatic insufficiency due to cystic fibrosis (CMS-HCC)    Mycobacterium abscessus infection  Resolved Problems:    * No resolved hospital problems. *      Kolter Reaver is a 32 y.o. male with PMHx  of CF (genotype S945L and [id number]), DM,  HTN, pancreatic insufficiency, s/p right upper lobe who presented to [facility] with Cystic fibrosis with pulmonary exacerbation (CMS-HCC)    CF exacerbation w/ Pulmonary Manifestations with acute exacerbation of bronchiectasis: Genotype S945L and [id number], last FEV1 of 67.1 % on 3/14. Culture Hx of Sm PsA (sensitive to Cipro, Tobra, Intermediate to zosyn, levo, cefepime and aztreo), M. Abscessus, S. Maltophilia, and OPF. Hx of R-partial lobectomy August 2018.  At home on daily azithromycin, clofazimime, and inhaled amikacin for hx of M. Abscess.  - f/u CF sputum culture, AFB cultures x 3, and fungal culture  - F/u RPP  - Start Tobra and Cipro 750 mg qd (5/19- )  - Continue home azithromycin and clofazimine; hold inhaled amikacin in setting of IV tobra   - F/u chest CT w/o contrast   - Pulmozyme BID, albuterol and 7 % HTS QID, Aerobika/Chest Vest QID w/ RT.   - Continue Symdeko BID   - Continue Symbicort    Pancreatic Insufficiency d/t Cystic Fibrosis  - Creon (7 - 8 tablets w/ meals, 5 -6 tablets with snacks)  - MVW BID, vitamin D3 1000 U daily  - Hold home pro-biotics     GI Manifestations of CF: Patient reports distant history of requiring Golyte for constipation. Denies current symptoms.   - PRN miralax and senna     History of DVT: UEs clots associated with PICC line, hx of unprovoked LE DVTs.   - Continue home Xarelto, consider transition to warfarin for ease of reversibility.    Diabetes: Blood sugar on admission 250. Patient reported sugars around 500s at home.  - 11 units galgine, 5 units humalog with meals, SSI   - Consult Endocrine, call in AM      Hypertension: Stable. BP slightly elevated  - continue home lisinopril and carvedilol    Chronic Pain:   - Lyrica 300 mg BID, tramadol 100 mg q6hrs PRN     Chronic pansinusitis: Continue Zyrtec and Singulair    Restless legs syndromes: Home pramipexole    Depression:  Stable. Continue Buspar, vilazodone, lamotrigine.     Insomnia: Melatonin 9 mg, trazodone 100 mg     GI Ppx: Home PPI  DVT Ppx: Currently on rivaroxaban.    F: None  E: Replete PRN  N: Regular Diet    Dispo: Med G, Floor.   Code Status: Full Code       ___________________________________________________________________    Chief Complaint:  Chief Complaint   Patient presents with   ??? Cough     Cystic fibrosis with pulmonary exacerbation (CMS-HCC)    HPI:  Christian Young is a 32 y.o. male with PMHx as reviewed in the EMR who presented to [facility] with Cystic fibrosis with pulmonary exacerbation (CMS-HCC). He said that it started 2 weeks ago, when he started to notice a worsening cough, with chest pain, subjective fevers, and dyspnea. He also began to have night sweats, joint  pain (knees, elbows and wrists), muscle pain and a general feeling of malaise. Yesterday, he reported a bloody lougie on his pillowcase when he woke up. He attributes this to an episode of night time hemoptysis. He denies any further hemoptysis, abdominal pain, or dirrhea . He endorsed a remote history of constipation requiring Golytely. He denies any leg swelling. His wife endorsed being sick with flu-like symptoms 2 months ago.     Allergies:  Cayston [aztreonam lysine]; Cefepime; Other; Slo-bid 100; Banana; and Tobramycin    Medications:   Prior to Admission medications    Medication Dose, Route, Frequency   albuterol (PROVENTIL HFA;VENTOLIN HFA) 90 mcg/actuation inhaler 2 puffs, Inhalation, Every 6 hours PRN   albuterol 2.5 mg/0.5 mL nebulizer solution 2.5 mg, Nebulization, Every 6 hours PRN   amikacin liposomal-neb.accessr (ARIKAYCE) 590 mg/8.4 mL NbSp 590 mg, Inhalation, Daily (standard)   azithromycin (ZITHROMAX) 500 MG tablet 500 mg, Oral, Daily (standard)   blood sugar diagnostic Strp Dispense 150 blood glucose test strips, ok to sub any brand preferred by insurance/patient, use up to 5x/day, dx E 08.9   blood-glucose meter Misc Dispense meter that is preferred by patient's insurance company   budesonide-formoterol (SYMBICORT) 160-4.5 mcg/actuation inhaler 2 puffs, Inhalation, 2 times a day (standard)   busPIRone (BUSPAR) 30 MG tablet 30 mg, Oral, 2 times a day (standard)   carvedilol (COREG) 3.125 MG tablet 3.125 mg, Oral, 2 times a day (standard)   cetirizine (ZYRTEC) 10 MG tablet 10 mg, Oral, Daily (standard)   cholecalciferol, vitamin D3, 1,000 unit tablet 1,000 Units, Oral, Daily (standard)   CLOFAZIMINE ORAL 200 mg, Oral, Daily (standard)   dornase alfa (PULMOZYME) 1 mg/mL nebulizer solution 2.5 mg, Inhalation, Daily (standard)   fluticasone (FLONASE) 50 mcg/actuation nasal spray 2 sprays, Each Nare, Daily (standard)   insulin ASPART (NOVOLOG FLEXPEN) 100 unit/mL injection pen Use up to 30 units/day, divided TID AC meals as per MD instructions   insulin detemir U-100 (LEVEMIR FLEXTOUCH U-100 INSULN) 100 unit/mL (3 mL) injection pen Use up to 30 units per day, as per MD instructions   Lacto-Bif-Sac-Bacil-Strep-bact (MVW COMPLETE FORMUL PROBIOTIC) 40 billion cell -15 mg CpDR 1 capsule, Oral, Daily (standard)   lamoTRIgine (LAMICTAL) 100 MG tablet 100 mg, Oral, Daily (standard)   lancets Misc Dispense 150 lancets, ok to sub any brand preferred by insurance/patient, use up to 5x/day, dx E08.9   lipase-protease-amylase (CREON) 36,000-114,000- 180,000 unit CpDR Take 7-8 capsules with meals and 5-6 capsules with snacks   lisinopril (PRINIVIL,ZESTRIL) 10 MG tablet TAKE 1 TABLET BY MOUTH EVERY DAY   melatonin 3 mg Tab 6 mg, Oral, Nightly montelukast (SINGULAIR) 10 mg tablet 10 mg, Oral, Nightly   MVW Complete, pediatric multivit 61-D3-vit K, 1,500-800 unit-mcg cap 1 capsule, Oral, 2 times a day (standard)   omeprazole (PRILOSEC) 40 MG capsule 40 mg, Oral, Daily (standard)   pen needle, diabetic (BD ULTRA-FINE NANO PEN NEEDLE) 32 gauge x 5/32 Ndle ok to sub any brand or size needle preferred by insurance/patient, use up to 4x/day, dx E 08.9   pramipexole (MIRAPEX) 0.125 MG tablet 0.25 mg, Oral, Daily (standard)   pregabalin (LYRICA) 300 MG capsule 300 mg, Oral, 2 times a day (standard)   rivaroxaban (XARELTO) 20 mg tablet 20 mg, Oral, Daily   sodium bicarb-sodium chloride 700-2,300 mg Pack Irrigate each nostril with 120 mL of solution (1 packet mixed with 240 mL of distilled water) BID    sodium chloride 7% 7 % Nebu 4 mL, Nebulization,  2 times a day (standard)   tezacaftor 100mg /ivacaftor 150mg  and ivacaftor 150mg  (SYMDEKO) tablets Take 1 tablet (tezacaftor 100mg /ivacaftor 150mg ) by mouth every AM and 1 tablet (ivacaftor 150mg ) every PM with fatty food   traMADol (ULTRAM) 50 mg tablet 100 mg, Oral, Every 6 hours PRN   traZODone (DESYREL) 100 MG tablet 100 mg, Oral, Nightly   vilazodone HCl (VIIBRYD ORAL) 40 mg, Oral, Daily (standard)       Medical History:  Past Medical History:   Diagnosis Date   ??? Anxiety    ??? Chronic pain disorder    ??? Cystic fibrosis (CMS-HCC)    ??? Depression    ??? Hypertension        Surgical History:  Past Surgical History:   Procedure Laterality Date   ??? PR REMOVAL OF LUNG,LOBECTOMY Right 03/29/2017    Procedure: REMOVAL OF LUNG, OTHER THAN PNEUMONECTOMY; SINGLE LOBE (LOBECTOMY);  Surgeon: Cherie Dark, MD;  Location: MAIN OR [facility];  Service: Thoracic       Social History:  Social History     Socioeconomic History   ??? Marital status: Married     Spouse name: Not on file   ??? Number of children: Not on file   ??? Years of education: Not on file   ??? Highest education level: Not on file   Occupational History   ??? Not on file Social Needs   ??? Financial resource strain: Not on file   ??? Food insecurity:     Worry: Not on file     Inability: Not on file   ??? Transportation needs:     Medical: Not on file     Non-medical: Not on file   Tobacco Use   ??? Smoking status: Never Smoker   ??? Smokeless tobacco: Never Used   Substance and Sexual Activity   ??? Alcohol use: No   ??? Drug use: No   ??? Sexual activity: Yes     Partners: Female     Birth control/protection: Condom   Lifestyle   ??? Physical activity:     Days per week: Not on file     Minutes per session: Not on file   ??? Stress: Not on file   Relationships   ??? Social connections:     Talks on phone: Not on file     Gets together: Not on file     Attends religious service: Not on file     Active member of club or organization: Not on file     Attends meetings of clubs or organizations: Not on file     Relationship status: Not on file   Other Topics Concern   ??? Not on file   Social History Narrative   ??? Not on file       Family History:  Family History   Problem Relation Age of Onset   ??? Bipolar disorder Mother    ??? Depression Mother        Review of Systems:  10 systems reviewed and are negative unless otherwise mentioned in HPI    Labs/Studies:  Labs and Studies from the last 24hrs per EMR and Reviewed    Physical Exam:  Temp:  [36.9 ??C-37 ??C] 36.9 ??C  Heart Rate:  [90-112] 90  Resp:  [20] 20  BP: (131-152)/(92-99) 131/92  SpO2:  [96 %-97 %] 97 %, BP 131/92  - Pulse 90  - Temp 36.9 ??C (Oral)  - Resp 20  - SpO2 97% ,  There is no height or weight on file to calculate BMI.     Physical Exam:  General Appearance: Well apearing male in no acute distress. Alert and oriented x 3.   Pulmonary: Normal respiratory effort. Slight crackles at right base.  Cardiovascular: tachycardic  HEENT: EOMI, PERRL, no cervical lymphopathy. Equal palate elevation on both sides  Abdomen: Soft, non-distended, non-tender. No rebound, no guarding, no masses, no hepatosplenomegaly, no hernias.  Extremities:  Extremities without clubbing, cyanosis or edema. No swelling.  Neurologic:  No motor abnormalities noted. Sensation grossly intact.   Skin:  Skin color normal. No rashes or lesions.

## 2017-12-29 NOTE — Unmapped (Signed)
Patient called because of worsening respiratory symptoms for the past 2 weeks. He reports increasing cough and sputum production associated with fever, chest pain and shortness of breath. He did not call the clinic and has not received any anibiotics for these symptoms. Currently, he thinks its too late to try PO antibiotics and wants to be admitted tomorrow for iv abx. Given shortness of breath and chest pain, I encouraged him to come to ED ASAP to be assessed (r/o pneumothorax, etc). He will be coming to ED.

## 2017-12-29 NOTE — Unmapped (Signed)
Patient states he feels like he is in CF flare. Fever - tmax 101. Increased cough with sputum. Body aches, fatigue. Tylenol at 0800.

## 2017-12-30 LAB — BASIC METABOLIC PANEL
ANION GAP: 12 mmol/L (ref 9–15)
BLOOD UREA NITROGEN: 18 mg/dL (ref 7–21)
BUN / CREAT RATIO: 25
CALCIUM: 9 mg/dL (ref 8.5–10.2)
CHLORIDE: 100 mmol/L (ref 98–107)
CO2: 26 mmol/L (ref 22.0–30.0)
CREATININE: 0.73 mg/dL (ref 0.70–1.30)
EGFR MDRD AF AMER: >=60 mL/min/{1.73_m2} (ref >=60)
EGFR MDRD NON AF AMER: >=60 mL/min/{1.73_m2} (ref >=60)
POTASSIUM: 4.2 mmol/L (ref 3.5–5.0)

## 2017-12-30 LAB — RED BLOOD CELL COUNT: Lab: 4.67

## 2017-12-30 LAB — CBC
HEMATOCRIT: 36.2 % — ABNORMAL LOW (ref 41.0–53.0)
HEMOGLOBIN: 11.7 g/dL — ABNORMAL LOW (ref 13.5–17.5)
MEAN CORPUSCULAR HEMOGLOBIN CONC: 32.4 g/dL (ref 31.0–37.0)
MEAN CORPUSCULAR HEMOGLOBIN: 25.1 pg — ABNORMAL LOW (ref 26.0–34.0)
MEAN CORPUSCULAR VOLUME: 77.6 fL — ABNORMAL LOW (ref 80.0–100.0)
MEAN PLATELET VOLUME: 8.6 fL (ref 7.0–10.0)
PLATELET COUNT: 303 10*9/L (ref 150–440)
RED CELL DISTRIBUTION WIDTH: 16.7 % — ABNORMAL HIGH (ref 12.0–15.0)
WBC ADJUSTED: 5.4 10*9/L (ref 4.5–11.0)

## 2017-12-30 LAB — EGFR MDRD NON AF AMER: Glomerular filtration rate/1.73 sq M.predicted.non black:ArVRat:Pt:Ser/Plas/Bld:Qn:Creatinine-based formula (MDRD): >=60

## 2017-12-30 LAB — TOBRAMYCIN RANDOM
Tobramycin:MCnc:Pt:Ser/Plas:Qn:: 1.1
Tobramycin:MCnc:Pt:Ser/Plas:Qn:: 7.9

## 2017-12-30 NOTE — Unmapped (Addendum)
[name] is a 32 year old gentleman with a PMHx of CF (genotype S945L and [id number]), DM,  HTN, pancreatic insufficiency, s/p right upper lobe who presented to [facility] on 12/29/17 with a CF exacerbation.     CF exacerbation w/ pulmonary manifestations, acute exacerbation of bronchiectasis: Genotype S945L and [id number], last FEV1 of 67.1 % on 3/14. Culture Hx of Sm PsA (sensitive to Cipro, Tobra, Intermediate to zosyn, levo, cefepime and aztreo), M. Abscessus, S. Maltophilia, and OPF. Hx of R-partial lobectomy August 2018.  At home on daily azithromycin, clofazimime, and inhaled amikacin for hx of M. Abscess. CT on 5/19 showed generalized bronchiectasis, with nodular tree-in-bud opacities in lower lobes. RPP on 5/19 was negative. IV Tobramcyin and PO Cipro were started (5/19- ). Home inhaled Amikacin was held in setting of IV Tobra. AC: Pulmozyme BID, Albuterol nebs, 7 % HTS QID, Aerobika, Chest Vest, and stationary bike. He continued home Azithromycin and Clofazimine (history of NTM infection). Also continued Symdeko BID and Symbicort BID. CF Sputum Cx and AFB Cx grew ***.     CF-related Pancreatic Insufficiency. Creon (7 - 8 tablets w/ meals, 5 -6 tablets with snacks). Vitamins continued, but pro-biotics were held.    CF GI disease: Patient reports distant history of requiring Golyte for constipation. PRN miralax and senna.    History of DVT: UE clots associated with PICC line, hx of unprovoked LE DVTs. He was continued on home Xarelto.    CFRD. Endocrinology consulted. Lantus 14U QD and Lispro 10U qAC given as well as Lispro sensitive qACHS.    HTN: continued home lisinopril and carvedilol    Chronic Pain:  Lyrica 300 mg BID, tramadol 100 mg q6hrs PRN.    Chronic pansinusitis: continued Zyrtec and Singulair.    RLS: Home pramipexole    Depression: Continued Buspar, Vilazodone, and Lamictal.     Insomnia: Melatonin 9mg  and Trazodone 100mg 

## 2017-12-30 NOTE — Unmapped (Signed)
Pt Reassessment:    Pt assessment remains largely unchanged from previous assessment. Pt remains A&Ox4, GCS 15, calm, cooperative, NAD. Pt endorses no pain. Skin warm, dry, intact. Breathing even and unlabored, CTA bilaterally, (-) SOB, (-) CP.   ??    Pt rounds completed. Call bell in reach, belongings at bedside, bed locked and low. Pt resting comfortably with no acute distress noted. Equal and unlabored respirations. Stating no needs at this time.

## 2017-12-30 NOTE — Unmapped (Signed)
Transport has not come for patient at this time, patient to go to CT then return to room.

## 2017-12-30 NOTE — Unmapped (Signed)
Patient tolerated all inhaled medications.  Patient requested to not do airway clearance.  Will start airway clearance in the morning.

## 2017-12-30 NOTE — Unmapped (Signed)
Aminoglycoside Initiation Pharmacy Note    Christian Young is a 32 y.o. male being initiated on tobramycin for CF exacerbation.    Goal peak: 20-30 mg/L  Goal trough: gentamicin/tobramycin <1    Pharmacokinetic Parameters:    Wt Readings from Last 1 Encounters:   10/23/17 (!) 123.9 kg (273 lb 3.2 oz)     Ideal body weight: 77.7 kg, Adjusted body weight: 96.2 kg    Lab Results   Component Value Date    CREATININE 0.68 (L) 12/29/2017       Vd = 31.7 L, ke = 0.40 hr-1    Recommended Dose: 460 mg IV q24 based on previous dosing    Pharmacy will continue to monitor and order levels as appropriate.  Patient will be followed for changes in renal function, toxicity, and efficacy.  Please page service pharmacist with questions/clarifications.    Shelbie Ammons,  PharmD

## 2017-12-30 NOTE — Unmapped (Signed)
Pt admitted from ED.  Ambulatory, on room air.  Pt c/o of chest and right side pain from coughing.  PRN tramadol given for relief.  Pt on tele.  A/Ox4.  Skin assessment performed and verified by secondary RN.  Pt tolerated all meds as scheduled.  POC continues.    Problem: Adult Inpatient Plan of Care  Goal: Plan of Care Review  Outcome: Progressing

## 2017-12-30 NOTE — Unmapped (Signed)
Medicine G History and Physical    Subjective:  Christian Young.  He still endorsed joint and muscle aches, as well as night sweats.  Not much improvement in his symptoms as of yet.  Has been adherent to all airway clearance medications thus far.     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 *    Christian Young is a 32 y.o. male with PMHx  of CF (genotype S945L and [id number]), DM,  HTN, pancreatic insufficiency, s/p right upper lobe who presented to [facility] with Cystic fibrosis with pulmonary exacerbation (CMS-HCC)    CF exacerbation w/ Pulmonary Manifestations with acute exacerbation of bronchiectasis: Genotype S945L and [id number], last FEV1 of 67.1 % on 3/14. Culture Hx of Sm PsA (sensitive to Cipro, Tobra, Intermediate to zosyn, levo, cefepime and aztreo), M. Abscessus, S. Maltophilia, and OPF. Hx of R-partial lobectomy August 2018.  At home on daily azithromycin, clofazimime, and inhaled amikacin for hx of M. Abscess. CT showed generalized bronchiectasis, with nodular tree-in-bud opacities in lower lobes. RPP on 5/19 was negative  - f/u CF sputum culture, AFB cultures x 3 to rule out NTM, and fungal culture  - Continue Tobra and Cipro 750 mg qd (5/19 - )  - Continue home azithromycin and clofazimine; hold inhaled amikacin in setting of IV tobra   - Pulmozyme BID, albuterol and 7 % HTS QID, Aerobika/Chest Vest QID w/ RT.   - Continue Symdeko BID   - Continue Symbicort    Pancreatic Insufficiency d/t Cystic Fibrosis  - Creon (7 - 8 tablets w/ meals, 5 -6 tablets with snacks)  - MVW BID, vitamin D3 1000 U daily  - Hold home pro-biotics     GI Manifestations of CF: Patient reports distant history of requiring Golytely for constipation. Denies current symptoms.   - PRN miralax and senna     History of DVT: UEs clots associated with PICC line, hx of unprovoked LE DVTs.   - Continue home Xarelto    Diabetes: Blood sugar on admission 250. Patient reported sugars around 500s at home. Endocrine recommended using an insulin sensitive scale, and to increase his postprandial doses to 6 units from 5.  - 11 units galgine, 6 units humalog with meals, SSI   [ ]  F/U endocrinology consult    Hypertension: Stable. BP slightly elevated  - continue home lisinopril and carvedilol    Chronic Pain:   - Lyrica 300 mg BID, tramadol 100 mg q6hrs PRN     Chronic pansinusitis: Continue Zyrtec and Singulair    Restless legs syndromes: Home pramipexole    Depression:  Stable. Continue Buspar, vilazodone, lamotrigine.     Insomnia: Melatonin 9 mg, trazodone 100 mg     GI Ppx: Home PPI  DVT Ppx: Currently on rivaroxaban.    F: None  E: Replete PRN  N: Regular Diet    Dispo: Med G, Floor.   Code Status: Full Code    ___________________________________________________________________    Labs/Studies:  Labs and Studies from the last 24hrs per EMR and Reviewed    Physical Exam:  Temp:  [35.9 ??C (96.6 ??F)-37 ??C (98.6 ??F)] 35.9 ??C (96.6 ??F)  Heart Rate:  [  87-98] 88  Resp:  [16-20] 16  BP: (121-139)/(66-92) 121/66  SpO2:  [93 %-99 %] 99 %, BP 121/66  - Pulse 88  - Temp 35.9 ??C (96.6 ??F) (Oral)  - Resp 16  - Ht 182.9 cm (6')  - Wt (!) 119.2 kg (262 lb 12.6 oz)  - SpO2 99%  - BMI 35.64 kg/m?? , Body mass index is 35.64 kg/m??.     Physical Exam:  General Appearance: Well apearing male in no acute distress. Alert and oriented x 3.   Pulmonary: Normal respiratory effort. decreased breath sounds at right base.  Cardiovascular: RRR  Abdomen: Soft, non-distended, non-tender.  Extremities:  Extremities without clubbing, cyanosis or edema. No swelling.  Neurologic:  No motor abnormalities noted. Sensation grossly intact.   Skin:  Skin color normal.    I attest that I have reviewed the student note and that the components of the history of the present illness, the physical exam, and the assessment and plan documented were performed by me or were performed in my presence by the student where I verified the documentation and performed (or re-performed) the exam and medical decision making. Malachi Carl, MD

## 2017-12-30 NOTE — Unmapped (Signed)
Cystic Fibrosis Nutrition Assessment    Inpatient: MD Consult this admission and related follow up  Primary Pulmonologist: Dr. Koren Shiver  ===================================================================  Christian Young is a 32 y.o. male seen for medical nutrition therapy. Currently admitted with CF exacerbation for IV antibiotics.    ===================================================================  INTERVENTION  1. Recommend high calorie high protein diet.    2. During admission continue Creon 24,000 (10-12 at meals, 8-9 at snacks).  - Can resume home Creon 36,000 after discharge.    3. Recommend increase supplemental vitamin D (only during admission) to 6000 International units vitamin D3 daily.  Continue inpatient CF vitamin regimen:  MVW Complete Formulation gel cap 2 daily   - note inpatient CF vitamin + 6000 International units vitamin D daily daily = total of 9000 International units vitamin D daily.  - When discharged home resume usual Complete Formulation D5000 softgels, 2 daily. Provides total of 10,000 International units vitamin D daily..    4. Weigh patient twice weekly this admit.    5. Recommend check PT. If elevated start 5mg  phytonadione daily x 5 days, then recheck PT. If WNL start 5mg  phytonadione twice weekly while on IV antibiotics.    6. Continue remainder of nutrition regimen:  - acid reducer  - bowel regimen    Inpatient:   Will follow up with patient per protocol: 1-2 times per week (and more frequent as indicated)===================================================================  ASSESSMENT:  Cystic Fibrosis Nutrition Category = Outstanding    Current diet is appropriate for CF. Patient continues to work towards goals for weight management.   Enzyme dose is within established guidelines. Vitamin prescription is not appropriate to reach/maintain optimal fat soluble vitamin levels. Patient would benefit from change in vitamin regimen. Sodium needs for CF met with PO and/or supplement. Bowel regimen is appropriate. Acid reducer appropriate for GERD and enzyme activation.    Malnutrition Assessment using AND/ASPEN Clinical Characteristics:  Patient does not meet AND/ASPEN criteria for malnutrition at this time (12/30/17 1519)    Malnutrition Assessment using AND/ASPEN Clinical Characteristics:  Patient does not meet AND/ASPEN criteria for malnutrition at this time (12/30/17 1519)    Goals:  1. Meet estimated daily needs: [id number] kcals (24-28 kcals/kg/day); 142-185 gm pro (DRI x 1.5 -2)); 3470 mL free water (Holliday Segar Method)  2. Reach/maintain established goals for CF:                Adult - BMI 22kg/m2 for CF females and 23kg/m2 for CF males  3. Normal fat-soluble vitamin levels: Vitamin A, Vitamin E and PT per lab range; Vitamin D 25OH total >30  4. Maintain glucose control. Carbohydrate content of diet should comprise 40-50% of total calorie needs, but carbohydrates are not restricted in this population.    5.  Meet sodium needs for CF  ===================================================================  INPATIENT:    Current Nutrition Orders (inpatient):       Nutrition Orders   (From admission, onward)            Start     Ordered    12/29/17 1840  Nutrition Therapy General (Regular)  Effective now     Question:  Nutrition Therapy (T):  Answer:  General (Regular)    12/29/17 1839        CF Nutrition related medications (inpatient): Nutritionally relevant medications reviewed.     Current Facility-Administered Medications:   ???  albuterol nebulizer solution 2.5 mg, 2.5 mg, Nebulization, 4x Daily (RT), Latrelle Dodrill, MD, 2.5 mg at 12/30/17 1355  ???  azithromycin (ZITHROMAX) tablet 500 mg, 500 mg, Oral, Daily, Latrelle Dodrill, MD, 500 mg at 12/30/17 0902  ???  budesonide-formoterol (SYMBICORT) 160-4.5 mcg/actuation inhaler 2 puff, 2 puff, Inhalation, BID, Latrelle Dodrill, MD, 2 puff at 12/30/17 1610  ???  busPIRone (BUSPAR) tablet 30 mg, 30 mg, Oral, BID, Latrelle Dodrill, MD, 30 mg at 12/30/17 9604  ???  carvedilol (COREG) tablet 3.125 mg, 3.125 mg, Oral, BID, Latrelle Dodrill, MD, 3.125 mg at 12/30/17 0901  ???  cetirizine (ZyrTEC) tablet 10 mg, 10 mg, Oral, Daily, Latrelle Dodrill, MD, 10 mg at 12/30/17 5409  ???  cholecalciferol (vitamin D3) tablet 1,000 Units, 1,000 Units, Oral, Daily, Latrelle Dodrill, MD, 1,000 Units at 12/30/17 0902  ???  ciprofloxacin HCl (CIPRO) tablet 750 mg, 750 mg, Oral, Q12H SCH, Malachi Carl, MD  ???  Clofazimine 50 mg capsule, 2 capsule, Oral, Daily with lunch, Latrelle Dodrill, MD, 2 capsule at 12/30/17 1309  ???  dextrose 50 % in water (D50W) 50 % solution 12.5 g, 12.5 g, Intravenous, Q10 Min PRN, Latrelle Dodrill, MD  ???  dextrose 50 % in water (D50W) 50 % solution 12.5 g, 12.5 g, Intravenous, Q10 Min PRN, Latrelle Dodrill, MD  ???  dornase alfa (PULMOZYME) 1 mg/mL nebulizer solution 2.5 mg, 2.5 mg, Inhalation, BID (RT), Latrelle Dodrill, MD, 2.5 mg at 12/30/17 8119  ???  insulin glargine (LANTUS) injection 11 Units, 11 Units, Subcutaneous, Nightly, Latrelle Dodrill, MD, 11 Units at 12/29/17 2113  ???  insulin lispro (HumaLOG) injection 0-2.5 Units, 0-2.5 Units, Subcutaneous, Nightly, Johnathan Hausen, MD  ???  insulin lispro (HumaLOG) injection 0-5 Units, 0-5 Units, Subcutaneous, TID AC, Johnathan Hausen, MD  ???  insulin lispro (HumaLOG) injection 6 Units, 6 Units, Subcutaneous, TID AC, Johnathan Hausen, MD  ???  lamoTRIgine (LaMICtal) tablet 100 mg, 100 mg, Oral, Daily, Latrelle Dodrill, MD, 100 mg at 12/30/17 0902  ???  lisinopril (PRINIVIL,ZESTRIL) tablet 10 mg, 10 mg, Oral, Nightly, Latrelle Dodrill, MD, 10 mg at 12/29/17 2113  ???  melatonin tablet 6 mg, 6 mg, Oral, Nightly, Latrelle Dodrill, MD, 6 mg at 12/29/17 2115  ???  montelukast (SINGULAIR) tablet 10 mg, 10 mg, Oral, Nightly, Latrelle Dodrill, MD, 10 mg at 12/29/17 2115  ???  MVW Complete (pediatric multivit 61-D3-vit K) 1,500-800 unit-mcg 1 capsule, 1 capsule, Oral, BID, Latrelle Dodrill, MD, 1 capsule at 12/30/17 0902  ???  pancrelipase (Lip-Prot-Amyl) (CREON) 24,000-76,000 -120,000 unit delayed release capsule 192,000-216,000 units of lipase, 8-9 capsule, Oral, With snacks, Latrelle Dodrill, MD  ???  pancrelipase (Lip-Prot-Amyl) (CREON) 24,000-76,000 -120,000 unit delayed release capsule 240,000-288,000 units of lipase, 10-12 capsule, Oral, 3xd Meals, Latrelle Dodrill, MD, 240,000 units of lipase at 12/30/17 1308  ???  pantoprazole (PROTONIX) EC tablet 20 mg, 20 mg, Oral, Daily, Latrelle Dodrill, MD, 20 mg at 12/30/17 1478  ???  polyethylene glycol (MIRALAX) packet 17 g, 17 g, Oral, Daily PRN, Latrelle Dodrill, MD  ???  pramipexole (MIRAPEX) tablet 0.25 mg, 0.25 mg, Oral, Nightly, Latrelle Dodrill, MD, 0.25 mg at 12/29/17 2112  ???  pregabalin (LYRICA) capsule 300 mg, 300 mg, Oral, BID, Latrelle Dodrill, MD, 300 mg at 12/30/17 2956  ???  rivaroxaban (XARELTO) tablet 20 mg, 20 mg, Oral, Daily, Latrelle Dodrill, MD, 20 mg at 12/29/17 2111  ???  senna (SENOKOT) tablet 2 tablet, 2 tablet, Oral, Nightly PRN, Latrelle Dodrill, MD  ???  AFB culture, , , Q8H **AND** Sputum induction, , , Once **AND** sodium chloride 3 % nebulizer solution 4 mL,  4 mL, Nebulization, Q8H PRN, Malachi Carl, MD  ???  sodium chloride 7% nebulizer solution 4 mL, 4 mL, Nebulization, 4x Daily (RT), Latrelle Dodrill, MD, 4 mL at 12/30/17 1355  ???  tezacaftor 100mg /ivacaftor 150mg  and ivacaftor 150mg  (SYMDEKO) tablets, 1 tablet, Oral, BID, Latrelle Dodrill, MD, 1 tablet at 12/30/17 0915  ???  tobramycin (NEBCIN) 460 mg in sodium chloride (NS) 0.9 % 100 mL IVPB, 460 mg, Intravenous, Q24H, Last Rate: 121.5 mL/hr at 12/30/17 0915, 460 mg at 12/30/17 0915 **AND** Inpatient consult to Pharmacy RX to dose: tobramycin, , , Once, Malachi Carl, MD  ???  traMADol (ULTRAM) tablet 100 mg, 100 mg, Oral, Q6H PRN, Latrelle Dodrill, MD, 100 mg at 12/30/17 1314  ???  traZODone (DESYREL) tablet 100 mg, 100 mg, Oral, Nightly, Latrelle Dodrill, MD, 100 mg at 12/29/17 2112  ???  vilazodone 40 mg tablet 40 mg, 1 tablet, Oral, Daily, Latrelle Dodrill, MD, 40 mg at 12/30/17 0930    CF Nutrition related labs (inpatient):  POC (5-20) 235  POC (5-19) 128    Last 5 Recorded Weights    12/29/17 2100   Weight: (!) 119.2 kg (262 lb 12.6 oz)   ==================================================================  CLINICAL DATA:  Past Medical History:   Diagnosis Date   ??? Anxiety    ??? Chronic pain disorder    ??? Cystic fibrosis (CMS-HCC)    ??? Depression    ??? Hypertension    - s/p RUL lobectomy 03-29-17  - Per review of Care Everywhere PMHx also includes: CFRD, PI, essential HTN    Anthroprometric Evaluation:  Weight changes: Weight down 11 pounds over 2 months intentionally. He would like to continue working towards more weight loss.    BMI Readings from Last 1 Encounters:   12/29/17 35.64 kg/m??     Wt Readings from Last 3 Encounters:   12/29/17 (!) 119.2 kg (262 lb 12.6 oz)   10/23/17 (!) 123.9 kg (273 lb 3.2 oz)   10/08/17 (!) 124 kg (273 lb 4.8 oz)     Ht Readings from Last 3 Encounters:   12/29/17 182.9 cm (6')   10/23/17 183 cm (6' 0.05)   10/08/17 183 cm (6' 0.05)   ==================================================================  Energy Intake (outpatient):  Diet: High in calories, fat, salt. Diet evaluated this visit. Reports decreased appetite with current illness.  Food allergies: Per review of Care Everywhere note banana is listed as allergy.  Diet and CFTR modulators: Prescribed Symdeko (tezacaftor/ivacaftor).    PO Supplements: none  Appetite Stimulant: none  Enteral feeding tube: n/a  Sodium in diet: Adequate from diet  Calcium in diet:  Adequate from diet     Physical Activity: minimal since surgery in August. Has gym membership. Has attended pulmonary rehab in the past.    Fat Malabsorption (outpatient):  Enzyme brand, (meals/snacks):   Creon 36,000 (7 at meals, 5 at snacks)   - Switched from Zenpep to Creon due to report of bloating & greasy stools when on Zenpep.   Enzyme administration details: correct pre-meal administration., moderate compliance, swallows capsules whole  Enzyme dose per MEAL (units lipase/kg/meal) 2100 Enzyme dose per DAY (units lipase/kg/day) 10936  Stools: denies s/s of malabsorption/diarrhea/constipation  Abdominal pain: None reported  Fecal Fat Studies:    Pancreatic Elastase-1   Date Value Ref Range Status   03/19/2017 <15 (L) mcg/g Final     Comment:     Adult and Pediatric Reference Ranges for    Pancreatic Elastase-1:  Normal:      >200 mcg/g  Moderate Pancreatic        Insufficiency:   100-200 mcg/g    Severe Pancreatic        Insufficiency:      <100 mcg/g     Elastase-1 (E-1) assay results are expressed  in mcg/g, which represent mcg E1/g feces.     It is not necessary to interrupt enzyme  substitution therapy.     Test Performed by:  [facility]  [address]  [place], [place] [address]     GI meds: Nutritionally relevant medications reviewed. MVW probiotic, omeprazole.    Vitamins/Minerals (outpatient):  CF-specific MVI, dose, compliance: MVW Complete Formulation Softgel D-5000 2 daily  Other vitamins/minerals/herbals: none  Calcium supplement: none  Patient Resources: -[employer] (phone [phone] [email or web address])   - Orders CF vitamin and probiotic directly from manufacturer (MVW), cost billed directly to [employer] grant  - He is thinks he may also be enrolled in [employer] but he is unsure at this time.  Fat-soluble vitamin levels:  Lab Results   Component Value Date/Time    VITAMINA 44.4 11/19/2016 0647     Lab Results   Component Value Date/Time    VITDTOTAL 27.9 03/19/2017 0508        Lab Results   Component Value Date/Time    PT 12.9 (H) 08/19/2017 1340    PT 11.1 03/29/2017 0508    PT 11.5 03/28/2017 0519    PT 14.1 (H) 11/29/2016 0807    PT 15.3 (H) 11/26/2016 0529   03-20-17: normal DesCarboxyPT (also known as PIVKA) indicates no vitamin K deficiency.      Bone Health: unknown     CF Related Diabetes: Yes, Per Care EveryWhere has been on 5 units levemir at bedtime in the past. Also noted per Care Everywhere he was supposed to have had Endo appointment at outside facility in March 2018 - unclear if he made it to this appointment.        Lab Results   Component Value Date/Time    A1C 6.1 (H) 11/19/2016 2130

## 2017-12-30 NOTE — Unmapped (Signed)
Endocrinology Consult Note    Requesting Attending Physician :  Blair Dolphin, *  Service Requesting Consult : Pulmonology (MD[facility])  Primary Care Provider: [facility]  Outpatient Endocrinologist: N/A    Assessment/Recommendations:    1. CFRD  -Continue Lantus 11u qd  -Increase Lispro to 8u qAC  -Change Lispro SSI to sensitive qACHS  -Please advise to changes to diet or plan for steroids    The patient was seen and discussed with Dr. Marcello Fennel.    Thank you for the consult. We will continue to follow patient as needed. Please call/page [phone] with questions or concerns.     Nelida Gores, MD  [facility] Endocrinology Fellow      History of Present Illness: :    Reason for Consult:   CFRD    Christian Young is a 32 y.o. male admitted for Cystic fibrosis with pulmonary exacerbation (CMS-HCC). I have been asked to evaluate Christian Young for CFRD. Per last endo note (seen by Dr. Monia Sabal in Feb 2019), he had been on Lantus 5u qd in the past (supervised by [facility] endo), but then went off of insulin for 3 years after moving to [place]. Last a1c 7.6. At his last visit, he was advised to restart Lantus 5u qd with instructions for self titration, Novolog 4u qAC and 2:50>200. He confirms that he's currently taking Lantus 11u qd, Novolog 5u qAC and 2:50>200 with adequate control at home. So far in the hospital, this home regimen was restarted. He received Lantus last night, and AM BG was 177. He has had excursions after breakfast and lunch. He endorses good appetite - eats three meals a day, little extra snacking beyond that. He is on a regular diet and no steroids.        Past Medical History:    Medical History:    Past Medical History:   Diagnosis Date   ??? Anxiety    ??? Chronic pain disorder    ??? Cystic fibrosis (CMS-HCC)    ??? Depression    ??? Hypertension        Surgical History:    Past Surgical History:   Procedure Laterality Date   ??? PR REMOVAL OF LUNG,LOBECTOMY Right 03/29/2017    Procedure: REMOVAL OF LUNG, OTHER THAN PNEUMONECTOMY; SINGLE LOBE (LOBECTOMY);  Surgeon: Cherie Dark, MD;  Location: MAIN OR [facility];  Service: Thoracic       Allergies:    Baruch Goldmann lysine]; Cefepime; Other; Slo-bid 100; Banana; and Tobramycin    All Medications:     Current Facility-Administered Medications   Medication Dose Route Frequency Provider Last Rate Last Dose   ??? albuterol nebulizer solution 2.5 mg  2.5 mg Nebulization 4x Daily (RT) Latrelle Dodrill, MD   2.5 mg at 12/30/17 1355   ??? azithromycin (ZITHROMAX) tablet 500 mg  500 mg Oral Daily Latrelle Dodrill, MD   500 mg at 12/30/17 0902   ??? budesonide-formoterol (SYMBICORT) 160-4.5 mcg/actuation inhaler 2 puff  2 puff Inhalation BID Latrelle Dodrill, MD   2 puff at 12/30/17 1027   ??? busPIRone (BUSPAR) tablet 30 mg  30 mg Oral BID Latrelle Dodrill, MD   30 mg at 12/30/17 2536   ??? carvedilol (COREG) tablet 3.125 mg  3.125 mg Oral BID Latrelle Dodrill, MD   3.125 mg at 12/30/17 0901   ??? cetirizine (ZyrTEC) tablet 10 mg  10 mg Oral Daily Latrelle Dodrill, MD   10 mg at 12/30/17 6440   ??? cholecalciferol (vitamin D3) tablet  1,000 Units  1,000 Units Oral Daily Latrelle Dodrill, MD   1,000 Units at 12/30/17 0902   ??? ciprofloxacin HCl (CIPRO) tablet 750 mg  750 mg Oral Q12H SCH Hitesh Molinda Bailiff, MD       ??? Clofazimine 50 mg capsule  2 capsule Oral Daily with lunch Latrelle Dodrill, MD   2 capsule at 12/30/17 1309   ??? dextrose 50 % in water (D50W) 50 % solution 12.5 g  12.5 g Intravenous Q10 Min PRN Latrelle Dodrill, MD       ??? dextrose 50 % in water (D50W) 50 % solution 12.5 g  12.5 g Intravenous Q10 Min PRN Latrelle Dodrill, MD       ??? dornase alfa (PULMOZYME) 1 mg/mL nebulizer solution 2.5 mg  2.5 mg Inhalation BID (RT) Latrelle Dodrill, MD   2.5 mg at 12/30/17 2841   ??? insulin glargine (LANTUS) injection 11 Units  11 Units Subcutaneous Nightly Latrelle Dodrill, MD   11 Units at 12/29/17 2113   ??? insulin lispro (HumaLOG) injection 0-12 Units  0-12 Units Subcutaneous ACHS Malachi Carl, MD       ??? insulin lispro (HumaLOG) injection 6 Units  6 Units Subcutaneous TID AC Johnathan Hausen, MD       ??? lamoTRIgine (LaMICtal) tablet 100 mg  100 mg Oral Daily Latrelle Dodrill, MD   100 mg at 12/30/17 3244   ??? lisinopril (PRINIVIL,ZESTRIL) tablet 10 mg  10 mg Oral Nightly Latrelle Dodrill, MD   10 mg at 12/29/17 2113   ??? melatonin tablet 6 mg  6 mg Oral Nightly Latrelle Dodrill, MD   6 mg at 12/29/17 2115   ??? montelukast (SINGULAIR) tablet 10 mg  10 mg Oral Nightly Latrelle Dodrill, MD   10 mg at 12/29/17 2115   ??? MVW Complete (pediatric multivit 61-D3-vit K) 1,500-800 unit-mcg 1 capsule  1 capsule Oral BID Latrelle Dodrill, MD   1 capsule at 12/30/17 0902   ??? pancrelipase (Lip-Prot-Amyl) (CREON) 24,000-76,000 -120,000 unit delayed release capsule 192,000-216,000 units of lipase  8-9 capsule Oral With snacks Latrelle Dodrill, MD       ??? pancrelipase (Lip-Prot-Amyl) (CREON) 24,000-76,000 -120,000 unit delayed release capsule 240,000-288,000 units of lipase  10-12 capsule Oral 3xd Meals Latrelle Dodrill, MD   240,000 units of lipase at 12/30/17 1308   ??? pantoprazole (PROTONIX) EC tablet 20 mg  20 mg Oral Daily Latrelle Dodrill, MD   20 mg at 12/30/17 0102   ??? polyethylene glycol (MIRALAX) packet 17 g  17 g Oral Daily PRN Latrelle Dodrill, MD       ??? pramipexole (MIRAPEX) tablet 0.25 mg  0.25 mg Oral Nightly Latrelle Dodrill, MD   0.25 mg at 12/29/17 2112   ??? pregabalin (LYRICA) capsule 300 mg  300 mg Oral BID Latrelle Dodrill, MD   300 mg at 12/30/17 7253   ??? rivaroxaban (XARELTO) tablet 20 mg  20 mg Oral Daily Latrelle Dodrill, MD   20 mg at 12/29/17 2111   ??? senna (SENOKOT) tablet 2 tablet  2 tablet Oral Nightly PRN Latrelle Dodrill, MD       ??? sodium chloride 3 % nebulizer solution 4 mL  4 mL Nebulization Q8H PRN Malachi Carl, MD       ??? sodium chloride 7% nebulizer solution 4 mL  4 mL Nebulization 4x Daily (RT) Latrelle Dodrill, MD   4 mL at 12/30/17 1355   ??? tezacaftor 100mg /ivacaftor 150mg  and ivacaftor 150mg  (SYMDEKO) tablets  1  tablet Oral BID Latrelle Dodrill, MD   1 tablet at 12/30/17 0915   ??? tobramycin (NEBCIN) 460 mg in sodium chloride (NS) 0.9 % 100 mL IVPB  460 mg Intravenous Q24H Malachi Carl, MD 121.5 mL/hr at 12/30/17 0915 460 mg at 12/30/17 0915   ??? traMADol (ULTRAM) tablet 100 mg  100 mg Oral Q6H PRN Latrelle Dodrill, MD   100 mg at 12/30/17 1314   ??? traZODone (DESYREL) tablet 100 mg  100 mg Oral Nightly Latrelle Dodrill, MD   100 mg at 12/29/17 2112   ??? vilazodone 40 mg tablet 40 mg  1 tablet Oral Daily Latrelle Dodrill, MD   40 mg at 12/30/17 0930       Social History:     Social History     Socioeconomic History   ??? Marital status: Married     Spouse name: None   ??? Number of children: None   ??? Years of education: None   ??? Highest education level: None   Occupational History   ??? None   Social Needs   ??? Financial resource strain: None   ??? Food insecurity:     Worry: None     Inability: None   ??? Transportation needs:     Medical: None     Non-medical: None   Tobacco Use   ??? Smoking status: Never Smoker   ??? Smokeless tobacco: Never Used   Substance and Sexual Activity   ??? Alcohol use: No   ??? Drug use: No   ??? Sexual activity: Yes     Partners: Female     Birth control/protection: Condom   Lifestyle   ??? Physical activity:     Days per week: None     Minutes per session: None   ??? Stress: None   Relationships   ??? Social connections:     Talks on phone: None     Gets together: None     Attends religious service: None     Active member of club or organization: None     Attends meetings of clubs or organizations: None     Relationship status: None   Other Topics Concern   ??? None   Social History Narrative   ??? None         Family History:    The patient's family history includes Bipolar disorder in his mother; Depression in his mother..    Code Status:    Full Code    Review of Systems:    A 12 system review of systems was negative except as noted in HPI.    Objective: :    Patient Vitals for the past 8 hrs:   BP Temp Temp src Pulse Resp SpO2   12/30/17 1346 138/79 36.8 ??C Oral 105 18 94 % I/O this shift:  In: 481.5 [P.O.:360; IV Piggyback:121.5]  Out: -     Physical Exam:    General appearance - alert, well appearing, and in no distress  Mental status - alert, oriented to person, place, and time  Eyes -  Sclera anicteric, EOM's intact  Resp - normal WOB. Speaking in full sentences without difficulty or noted dyspnea.  Neurological - alert, oriented, normal speech, no grossly abnormal focal findings.  Psych - normal affect and behavior  Skin - No rash or lesions, no acanthosis    Test Results    Data Review  Lab Results   Component Value Date    A1C 6.1 (H) 11/19/2016  A1C 6.3 (H) 11/18/2016     Lab Results   Component Value Date    CREATININE 0.73 12/30/2017

## 2017-12-30 NOTE — Unmapped (Signed)
.  Adult Inpatient CF Initial Assessment        Zayquan Bogard is a 32 y.o. male who was admitted for No admission diagnoses are documented for this encounter.Marland Kitchen    Heart Rate: 98  Respiratory Rate: 18  Bilateral Breath Sounds: clear    Cough/Sputum: No    Are they coughing up blood? N/A    Home medication & frequency :    Albuterol Neb 2.5mg  BID  Hypertonic Saline 7% BID  Pulmozyme 2.5mg  BID  Tobi INH 300mg  BID  Symbicort HFA BID    Home airway clearance & frequency:    Vest BID      Times Chosen for Inhaled Medications and Airway Clearance:  0900      Methods tried & found to be ineffective: Vibralung      Home O2? NO    If yes, what is their home requirement: n/a    Home non-invasive ventilation?No  If yes, enter type of device, settings, & mode:n/a    Airway clearance technique method offered & decided upon:   Chest Vest  Comments:

## 2017-12-30 NOTE — Unmapped (Signed)
Care Management  Initial Transition Planning Assessment              General  Care Manager assessed the patient by : In person interview with patient, Discussion with Clinical Care team, Medical record review  Orientation Level: Oriented X4  Who provides care at home?: N/A    Contact/Decision Maker      Extended Emergency Contact Information  Primary Emergency Contact: Griffin,Alyson   [place]  Mobile Phone: [phone]  Relation: Spouse    Legal Next of Kin / Guardian / POA / Advance Directives     Advance Directive (Medical Treatment)  Does patient have an advance directive covering medical treatment?: Patient has advance directive covering medical treatment, copy not in chart.  Advance directive covering medical treatment not in Chart:: Copy requested from family  Information provided on advance directive:: No  Patient requests assistance:: No    Advance Directive (Mental Health Treatment)  Does patient have an advance directive covering mental health treatment?: Patient does not have advance directive covering mental health treatment.  Reason patient does not have an advance directive covering mental health treatment:: Patient does not wish to complete one at this time.    Patient Information  Lives with: Spouse/significant other, Family members    Type of Residence: Private residence    Type of Residence: Mailing Address:  [address]  [address]  [place] [address]  Contacts:    Patient Phone Number: [phone] (home)         Medical Provider(s): [facility]  Reason for Admission: Admitting Diagnosis:  No admission diagnoses are documented for this encounter.  Past Medical History:   has a past medical history of Anxiety, Chronic pain disorder, Cystic fibrosis (CMS-HCC), Depression, and Hypertension.  Past Surgical History:   has a past surgical history that includes pr removal of lung,lobectomy (Right, 03/29/2017).   Previous admit date: 08/19/2017    Primary Insurance- Payor: BCBS / Plan: BCBS OOS PLAN / Product Type: *No Product type* /   Secondary Insurance ??? None  Prescription Coverage ???   Preferred Pharmacy - [id number] [place], [place] - [address]  [facility] - [place], [place] - [address]  [facility] [place], [place] - [address]    Transportation home: Private vehicle  Level of function prior to admission: Independent     Location/Detail: [address] [employer] [employer], [place]    Support Systems: Family Members    Responsibilities/Dependents at home?: No    Home Care services in place prior to admission?: No     Outpatient/Community Resources in place prior to admission: Clinic, Meal Delivery Service  Agency detail (Name/Phone #): [facility]    Equipment Currently Used at Home: respiratory supplies  Current HME Agency (Name/Phone #): nebulizer, percussion vest    Currently receiving outpatient dialysis?: No     Financial Information     Need for financial assistance?: No     Social Determinants of Health  Social Determinants of Health were addressed in provider documentation.  Please refer to patient history.    Discharge Needs Assessment  Concerns to be Addressed: denies needs/concerns at this time    Clinical Risk Factors: Multiple Diagnoses (Chronic)    Barriers to taking medications: No    Prior overnight hospital stay or ED visit in last 90 days: No    Readmission Within the Last 30 Days: no previous admission in  last 30 days    Anticipated Changes Related to Illness: none    Equipment Needed After Discharge: none    Discharge Facility/Level of Care Needs: (dc home w HI)    Readmission  Risk of Unplanned Readmission Score: UNPLANNED READMISSION SCORE: 30%  Readmitted Within the Last 30 Days?   Patient at risk for readmission?: Yes    Discharge Plan  Screen findings are: Discharge planning needs identified or anticipated (Comment).    Expected Discharge Date: 01/13/18    Expected Transfer from Critical Care:      Patient and/or family were provided with choice of facilities / services that are available and appropriate to meet post hospital care needs?: Other (Comment)(Patient is already established with [facility] and does not wish to change)     Initial Assessment complete?: Yes

## 2017-12-30 NOTE — Unmapped (Signed)
Patient transported to CT Scan  Transported by Radiology  How tranported Wheelchair  Cardiac Monitor no

## 2017-12-31 LAB — BASIC METABOLIC PANEL
ANION GAP: 14 mmol/L (ref 9–15)
BUN / CREAT RATIO: 19
CHLORIDE: 101 mmol/L (ref 98–107)
CO2: 24 mmol/L (ref 22.0–30.0)
CREATININE: 0.79 mg/dL (ref 0.70–1.30)
EGFR MDRD AF AMER: >=60 mL/min/{1.73_m2} (ref >=60)
EGFR MDRD NON AF AMER: >=60 mL/min/{1.73_m2} (ref >=60)
GLUCOSE RANDOM: 232 mg/dL — ABNORMAL HIGH (ref 65–179)
POTASSIUM: 4.2 mmol/L (ref 3.5–5.0)
SODIUM: 139 mmol/L (ref 135–145)

## 2017-12-31 LAB — SODIUM: Sodium:SCnc:Pt:Ser/Plas:Qn:: 139

## 2017-12-31 LAB — CBC
HEMOGLOBIN: 11.8 g/dL — ABNORMAL LOW (ref 13.5–17.5)
MEAN CORPUSCULAR HEMOGLOBIN CONC: 32.4 g/dL (ref 31.0–37.0)
MEAN CORPUSCULAR HEMOGLOBIN: 25.3 pg — ABNORMAL LOW (ref 26.0–34.0)
MEAN CORPUSCULAR VOLUME: 78.1 fL — ABNORMAL LOW (ref 80.0–100.0)
MEAN PLATELET VOLUME: 8.6 fL (ref 7.0–10.0)
PLATELET COUNT: 307 10*9/L (ref 150–440)
RED CELL DISTRIBUTION WIDTH: 16.7 % — ABNORMAL HIGH (ref 12.0–15.0)
WBC ADJUSTED: 6.1 10*9/L (ref 4.5–11.0)

## 2017-12-31 LAB — RED BLOOD CELL COUNT: Lab: 4.68

## 2017-12-31 NOTE — Unmapped (Signed)
Aminoglycoside Therapeutic Monitoring Pharmacy Note    Christian Young is a 32 y.o. male continuing tobramycin. Date of therapy initiation: 12/29/17    Indication: CF exacerbation    Prior Dosing Information: Current regimen 460 mg IV q24 hrs (CF high-dose)      Goals:  Therapeutic Drug Levels  ?? Trough level: tobramycin <1 mg/L (for current regimen)  ?? Peak level: tobramycin 20-35 mg/L (for current regimen)    Additional Clinical Monitoring/Outcomes  Renal function, volume status (intake and output)    Results:   ?? Random level: 1.1 mg/L, drawn ~14 hrs prior to next dose (Extrapolated trough: 0.02 mg/L)   ?? Peak level: 7.9 mg/L, drawn ~2.25 hrs after end of infusion (Extrapolated Peak: ~15 mg/L)    Wt Readings from Last 1 Encounters:   12/29/17 (!) 119.2 kg (262 lb 12.6 oz)     Lab Results   Component Value Date    CREATININE 0.79 12/31/2017       Pharmacokinetic Considerations and Significant Drug Interactions:  ? Adult (calculated on 12/31/17): Vd = (not calculated) L, ke = 0.3 hr-1  ? Concurrent nephrotoxic meds: Not applicable    Assessment/Plan:  Recommended Dose  ? Change current regimen to 360 mg IV q12 hrs (conventional dosing). New peak goal will be 10-14 mg/L.  ? Estimated peak and trough on recommended regimen: peak = 10-14 mg/L, trough = < 1 mg/L    Follow-up  ? Level due: peak and trough levels around the fourth or fifth dose  ? A pharmacist will continue to monitor and order levels as appropriate    Please page service pharmacist with questions/clarifications.    Rubie Maid, PharmD

## 2017-12-31 NOTE — Unmapped (Signed)
Patient did well with doing all of their treatments today. Patient did their airway clearance without complications. Monitoring pt

## 2017-12-31 NOTE — Unmapped (Signed)
[facility] Inpatient Service  Daily Progress Note     Interval/Overnight/Subjective:      NAEON. Still awaiting AFB & CF sputum cultures to be drawn. Was having some nausea this AM so added Phenergan PRN.     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 *    Christian Young is a 32 y.o. male with PMHx  of CF (genotype S945L and [id number]), DM,  HTN, pancreatic insufficiency, s/p right upper lobe who presented to [facility] with Cystic fibrosis with pulmonary exacerbation (CMS-HCC)    CF exacerbation w/ pulmonary manifestations, acute exacerbation of bronchiectasis: Genotype S945L and [id number], last FEV1 of 67.1 % on 3/14. Culture Hx of Sm PsA (sensitive to Cipro, Tobra, Intermediate to zosyn, levo, cefepime and aztreo), M. Abscessus, S. Maltophilia, and OPF. Hx of R-partial lobectomy August 2018.  At home on daily azithromycin, clofazimime, and inhaled amikacin for hx of M. Abscess. CT showed generalized bronchiectasis, with nodular tree-in-bud opacities in lower lobes. RPP on 5/19 was negative.  - AC: Pulmozyme BID, Albuterol nebs, 7 % HTS QID, Aerobika, Chest Vest   - f/u CF sputum culture, AFB cultures x 3 to rule out NTM, and fungal culture  - IV Tobramycin and PO Cipro (5/19 - )  - home Azithromycin and Clofazimine (history of NTM infection)   - hold inhaled Amikacin in setting of IV Tobra   - Symdeko BID   - Symbicort BID    CF-related Pancreatic Insufficiency.  - Creon (7 - 8 tablets w/ meals, 5 -6 tablets with snacks)  - MVW BID, vitamin D3 1000 U daily  - Hold home pro-biotics     CF GI Disease: Patient reports distant history of requiring Golytely for constipation. Denies current symptoms. - PRN miralax and senna     History of DVT: UE clots a/w PICC line, hx of unprovoked LE DVTs.   - home Xarelto    CFRD.  - Endocrinology consulted  - Lantus 11U QD  - Lispro to 8U qAC  - Lispro sensitive qACHS    Chronic stable medical conditions:   HTN: home Lisinopril and Coreg  Chronic Pain: Lyrica 300mg  BID, Tramadol 100mg  q6hrs PRN   Chronic Sinusitis: Zyrtec, Singulair  RLS: Home pramipexole  Depression: Buspar, vilazodone, lamotrigine  Insomnia: Melatonin 9mg , Trazodone 100mg      Daily Checklist:   Diet: regular  GI ppx: home PPI  DVT ppx: home Xarelto  Dispo: Med [facility], floor status  Code Status: Full Code  ___________________________________________________________________    Labs/Studies:  Labs and Studies from the last 24hrs per EMR and Reviewed    Physical Exam:  Temp:  [35.9 ??C (96.6 ??F)-37 ??C (98.6 ??F)] 35.9 ??C (96.6 ??F)  Heart Rate:  [87-98] 88  Resp:  [16-20] 16  BP: (121-139)/(66-92) 121/66  SpO2:  [93 %-99 %] 99 %, BP 121/66  - Pulse 88  - Temp 35.9 ??C (96.6 ??F) (Oral)  - Resp 16  - Ht 182.9 cm (6')  - Wt (!) 119.2 kg (262 lb 12.6 oz)  - SpO2 99%  - BMI 35.64 kg/m?? , Body mass index is 35.64 kg/m??.     Physical Exam:  GEN: no  acute distress  PULM: Normal respiratory effort. decreased breath sounds at right base.  CV: RRR  ABD: soft, NT/ND  EXT:  No clubbing, cyanosis or edema  NEURO: nonfocal, alert, oriented

## 2017-12-31 NOTE — Unmapped (Signed)
Patient was compliant with all inhaled scheduled medications and airway clearance today.

## 2017-12-31 NOTE — Unmapped (Signed)
NAEON.  Pt given PRN oxy for reported pain.  Pt states  med effective in pain control.  Tolerated all meds as scheduled.   VSS. On room air.  Pt needs more education on eating habits and diabetes management.  POC continues.    Problem: Adult Inpatient Plan of Care  Goal: Plan of Care Review  Outcome: Progressing     Problem: Diabetes Comorbidity  Goal: Blood Glucose Level Within Desired Range  Outcome: Progressing     Problem: Pain Chronic (Persistent) (Comorbidity Management)  Goal: Acceptable Pain Control and Functional Ability  Outcome: Progressing

## 2018-01-01 LAB — BASIC METABOLIC PANEL
ANION GAP: 9 mmol/L (ref 9–15)
CALCIUM: 9.2 mg/dL (ref 8.5–10.2)
CHLORIDE: 102 mmol/L (ref 98–107)
CO2: 25 mmol/L (ref 22.0–30.0)
CREATININE: 0.79 mg/dL (ref 0.70–1.30)
EGFR MDRD NON AF AMER: >=60 mL/min/{1.73_m2} (ref >=60)
GLUCOSE RANDOM: 160 mg/dL (ref 65–179)
POTASSIUM: 4.2 mmol/L (ref 3.5–5.0)
SODIUM: 136 mmol/L (ref 135–145)

## 2018-01-01 LAB — CBC
HEMATOCRIT: 37.6 % — ABNORMAL LOW (ref 41.0–53.0)
HEMOGLOBIN: 12.2 g/dL — ABNORMAL LOW (ref 13.5–17.5)
MEAN CORPUSCULAR HEMOGLOBIN CONC: 32.5 g/dL (ref 31.0–37.0)
MEAN CORPUSCULAR HEMOGLOBIN: 25.2 pg — ABNORMAL LOW (ref 26.0–34.0)
MEAN CORPUSCULAR VOLUME: 77.4 fL — ABNORMAL LOW (ref 80.0–100.0)
MEAN PLATELET VOLUME: 8.6 fL (ref 7.0–10.0)
RED BLOOD CELL COUNT: 4.85 10*12/L (ref 4.50–5.90)
RED CELL DISTRIBUTION WIDTH: 16.9 % — ABNORMAL HIGH (ref 12.0–15.0)
WBC ADJUSTED: 6.1 10*9/L (ref 4.5–11.0)

## 2018-01-01 LAB — HEMATOCRIT: Lab: 37.6 — ABNORMAL LOW

## 2018-01-01 LAB — BUN / CREAT RATIO: Urea nitrogen/Creatinine:MRto:Pt:Ser/Plas:Qn:: 19

## 2018-01-01 NOTE — Unmapped (Addendum)
No acute issues to report. On room air. VSS. Gave prn oxycodone for pain d/t coughing. Pain relieved. R port occluded. Administered 2mg  altepase. Got some blood return but still hard to flush. Re-accessed port. Port flushed appropriately and blood return noted. Sputum needs to be collected but patient has not coughed anything up. Patient states I'm trying really hard to cough stuff up. Will continue to monitor and follow POC.     Problem: Adult Inpatient Plan of Care  Goal: Plan of Care Review  Outcome: Progressing  Goal: Patient-Specific Goal (Individualization)  Outcome: Progressing  Flowsheets  Taken 12/31/2017 1732 by Arman Bogus, RN  Individualized Care Needs: Prefers to be called [name]  Taken 01/01/2018 0343 by Susa Raring, RN  Patient-Specific Goals (Include Timeframe): Re-establish patency for CVAD  Anxieties, Fears or Concerns: Concern about port clotting off.  Goal: Absence of Hospital-Acquired Illness or Injury  Outcome: Progressing  Intervention: Identify and Manage Fall Risk  Flowsheets (Taken 12/31/2017 2000)  Safety Interventions: low bed;fall reduction program maintained  Intervention: Prevent Skin Injury  Flowsheets (Taken 01/01/2018 0343)  Pressure Reduction Techniques: frequent weight shift encouraged  Intervention: Prevent VTE (venous thromboembolism)  Flowsheets (Taken 01/01/2018 0343)  VTE Prevention/Management: ambulation promoted;anticoagulation therapy  Intervention: Prevent Infection  Flowsheets (Taken 01/01/2018 0343)  Infection Prevention: equipment surfaces disinfected;handwashing promoted;single patient room provided;rest/sleep promoted  Goal: Optimal Comfort and Wellbeing  Outcome: Progressing  Goal: Readiness for Transition of Care  Outcome: Progressing  Goal: Rounds/Family Conference  Outcome: Progressing     Problem: Diabetes Comorbidity  Goal: Blood Glucose Level Within Desired Range  Outcome: Progressing  Intervention: Maintain Glycemic Control  Flowsheets (Taken 01/01/2018 0343)  Glycemic Management: blood glucose monitoring;supplemental insulin given     Problem: Pain Chronic (Persistent) (Comorbidity Management)  Goal: Acceptable Pain Control and Functional Ability  Outcome: Progressing  Intervention: Develop Pain Management Plan  Flowsheets (Taken 01/01/2018 0343)  Pain Management Interventions: around-the-clock dosing utilized;breathing exercises;pain management plan reviewed with patient/caregiver  Intervention: Manage Persistent Pain  Flowsheets (Taken 01/01/2018 0343)  Bowel Elimination Promotion: ambulation promoted;adequate fluid intake promoted;privacy promoted  Medication Review/Management: medications reviewed  Intervention: Optimize Psychosocial Wellbeing  Flowsheets (Taken 01/01/2018 0343)  Supportive Measures: active listening utilized;decision-making supported;goal setting facilitated;problem solving facilitated;self-care encouraged;verbalization of feelings encouraged  Diversional Activities: television;smartphone  Family/Support System Care: involvement promoted;self-care encouraged     Problem: Adjustment to Illness (Cystic Fibrosis)  Goal: Optimal Coping  Outcome: Progressing  Intervention: Support Psychosocial Response to Chronic Condition  Flowsheets (Taken 01/01/2018 0343)  Supportive Measures: active listening utilized;decision-making supported;goal setting facilitated;problem solving facilitated;self-care encouraged;verbalization of feelings encouraged     Problem: Infection (Cystic Fibrosis)  Goal: Absence of Infection Signs/Symptoms  Outcome: Progressing  Intervention: Manage Infection and Prevent Transmission  Flowsheets  Taken 12/31/2017 2000  Isolation Precautions: contact precautions maintained  Taken 01/01/2018 0343  Infection Management: aseptic technique maintained     Problem: Malabsorption (Cystic Fibrosis)  Goal: Optimal Bowel Elimination  Outcome: Progressing  Intervention: Optimize Nutrient Absorption  Flowsheets (Taken 01/01/2018 0343) Medication Review/Management: medications reviewed     Problem: Oral Intake Inadequate (Cystic Fibrosis)  Goal: Optimal Nutrition Intake  Outcome: Progressing  Intervention: Promote and Optimize Nutrition Intake  Flowsheets (Taken 01/01/2018 0343)  Oral Nutrition Promotion: calorie dense foods provided     Problem: Respiratory Compromise (Cystic Fibrosis)  Goal: Effective Oxygenation and Ventilation  Outcome: Progressing  Intervention: Promote Airway Secretion Clearance  Flowsheets (Taken 01/01/2018 0343)  Breathing Techniques/Airway Clearance: deep/controlled cough encouraged  Cough And Deep Breathing: done  independently per patient  Intervention: Optimize Oxygenation and Ventilation  Flowsheets (Taken 01/01/2018 0343)  Head of Bed (HOB): HOB elevated  Airway/Ventilation Management: airway patency maintained;pulmonary hygiene promoted;humidification applied;calming measures promoted

## 2018-01-01 NOTE — Unmapped (Signed)
SW REFERRAL SOURCE/REASON: Christian Young is a 32 y.o. male followed by [facility] and currently hospitalized on 6BT for CF exacerbation. This was the first meeting in-person between Christian Young and this SW. Focus of conversation was to check-in, offer support, and follow up on phone conversations in April. See prior SW notes for further information (particularly 11/20/17).    SOCIAL HISTORY/SUPPORTS: Pt lives in [place] with his wife, Christian Young, and therapy dog, Mia. His in laws live near by. Cites a supportive relationship with his wife, who plans to visit him in the hospital tomorrow.    EDUCATION/EMPLOYMENT/FINANCIAL STATUS: Wife, Christian Young, now working and finishing her probation period today. Christian Young has received a recent grant through [employer] [place] to help with food costs. He has not yet applied for SNAP.     INSURANCE: currently self-pay (see SW notes from 11/2017)    Christian Young shared that he should have enough medication to last until he is eligible for insurance through his wife (anticipated for July 8). SW did discuss applying for PAP in case there is an unanticipated lapse.    SW also encouraged application to [facility]. Christian Young endorsed receiving applications via email from this SW in April but has not yet completed them. SW offered to provide printed applications today and assist with processing while hospitalized. Christian Young in agreement.    FOOD INSECURITY (Y/N): Endorses (see recent SW notes) but states that he will have food at home. Wife now working     MENTAL HEALTH/COPING: Screenings not completed today. Christian Young shared that his mental health is up and down but that he is coping okay. Discussed difficulty of being away from his family while he is in the hospital. He endorsed being in regular psychotherapy ([facility] in [place]) which is helpful. Denies need for further invention to address mental health symptoms. Christian Young has received information in the past for [employer].    SW will screen for MH/SA at another time.    PLAN: SW provided [employer] and offered to assist in processing these while Christian Young is inpatient. Christian Young believes Christian Young can bring supporting documents needs for applications tomorrow.     SW will also bring [employer] a bag of groceries from clinic food pantry on Friday. Christian Young has information needed to apply for SNAP. Christian Young remains on clinic [employer] and is aware that next drawing will be in August.    Louretta Shorten, LCSW  Adult CF Social Worker  Jan 01, 2018 4:19 PM

## 2018-01-01 NOTE — Unmapped (Signed)
Pt A/Ox4. BG monitored and supplemental insulin administered as needed. PRN oxycodone administered x2 with relief. PRN phenergan administered x2 with relief. Pt ambulated in hallway this shift and tolerated it will. Pt maintaining oxygenation on room air. Will continue POC.    Problem: Adult Inpatient Plan of Care  Goal: Plan of Care Review  Outcome: Progressing  Goal: Patient-Specific Goal (Individualization)  12/31/2017 1732 by Arman Bogus, RN  Flowsheets (Taken 12/31/2017 1732)  Individualized Care Needs: Prefers to be called [name]  12/31/2017 1728 by Arman Bogus, RN  Outcome: Progressing  Goal: Absence of Hospital-Acquired Illness or Injury  Outcome: Progressing  Goal: Optimal Comfort and Wellbeing  Outcome: Progressing  Goal: Readiness for Transition of Care  Outcome: Progressing  Goal: Rounds/Family Conference  Outcome: Progressing       Problem: Diabetes Comorbidity  Goal: Blood Glucose Level Within Desired Range  Outcome: Progressing  Intervention: Maintain Glycemic Control  Flowsheets (Taken 12/31/2017 1728)  Glycemic Management: blood glucose monitoring;supplemental insulin given     Problem: Pain Chronic (Persistent) (Comorbidity Management)  Goal: Acceptable Pain Control and Functional Ability  Outcome: Progressing  Intervention: Develop Pain Management Plan  Flowsheets (Taken 12/31/2017 1728)  Pain Management Interventions: care clustered;pain management plan reviewed with patient/caregiver  Intervention: Manage Persistent Pain  Flowsheets (Taken 12/31/2017 1728)  Bowel Elimination Promotion: adequate fluid intake promoted  Medication Review/Management: medications reviewed     Problem: Adjustment to Illness (Cystic Fibrosis)  Goal: Optimal Coping  Outcome: Progressing     Problem: Infection (Cystic Fibrosis)  Goal: Absence of Infection Signs/Symptoms  Outcome: Progressing  Intervention: Manage Infection and Prevent Transmission  Flowsheets (Taken 12/31/2017 0800)  Isolation Precautions: contact precautions maintained     Problem: Malabsorption (Cystic Fibrosis)  Goal: Optimal Bowel Elimination  Outcome: Progressing  Intervention: Optimize Nutrient Absorption  Flowsheets (Taken 12/31/2017 1728)  Medication Review/Management: medications reviewed     Problem: Oral Intake Inadequate (Cystic Fibrosis)  Goal: Optimal Nutrition Intake  Outcome: Progressing     Problem: Respiratory Compromise (Cystic Fibrosis)  Goal: Effective Oxygenation and Ventilation  Outcome: Progressing  Intervention: Optimize Oxygenation and Ventilation  Flowsheets (Taken 12/31/2017 1728)  Head of Bed (HOB): HOB elevated

## 2018-01-01 NOTE — Unmapped (Signed)
Patient did well with doing all of their treatments today. Patient did their airway clearance without complications.

## 2018-01-01 NOTE — Unmapped (Signed)
Endocrinology Consult Note    Requesting Attending Physician :  Blair Dolphin, *  Service Requesting Consult : Pulmonology ([facility])  Primary Care Provider: [facility] Practices  Outpatient Endocrinologist: N/A    Assessment/Recommendations:    1. CFRD  -Increase Lantus to 14 u qd  -Cont Lispro 8u qAC  -Change Lispro SSI to sensitive qACHS  -Please advise to changes to diet or plan for steroids    The patient was seen and discussed with Dr. Marcello Fennel.    Thank you for the consult. We will continue to follow patient as needed. Please call/page [phone] with questions or concerns.     Nelida Gores, MD  [facility] Endocrinology Fellow      History of Present Illness: :    Reason for Consult:   CFRD    Christian Young is a 32 y.o. male admitted for Cystic fibrosis with pulmonary exacerbation (CMS-HCC). I have been asked to evaluate Christian Young for CFRD. Per last endo note (seen by Dr. Monia Sabal in Feb 2019), he had been on Lantus 5u qd in the past (supervised by [facility] endo), but then went off of insulin for 3 years after moving to [place]. Last a1c 7.6. At his last visit, he was advised to restart Lantus 5u qd with instructions for self titration, Novolog 4u qAC and 2:50>200. He confirms that he's currently taking Lantus 11u qd, Novolog 5u qAC and 2:50>200 with adequate control at home. So far in the hospital, this home regimen was restarted. He received Lantus last night, and AM BG was 177. He has had excursions after breakfast and lunch. He endorses good appetite - eats three meals a day, little extra snacking beyond that. He is on a regular diet and no steroids.      Interval hx: BG in 200s yesterday, AM 153 today. Not on steroids. No complaints or concerns.       Past Medical History:    Medical History:    Past Medical History:   Diagnosis Date   ??? Anxiety    ??? Chronic pain disorder    ??? Cystic fibrosis (CMS-HCC)    ??? Depression    ??? Hypertension        Surgical History:    Past Surgical History: Procedure Laterality Date   ??? PR REMOVAL OF LUNG,LOBECTOMY Right 03/29/2017    Procedure: REMOVAL OF LUNG, OTHER THAN PNEUMONECTOMY; SINGLE LOBE (LOBECTOMY);  Surgeon: Cherie Dark, MD;  Location: MAIN OR [place];  Service: Thoracic       Allergies:    Baruch Goldmann lysine]; Cefepime; Other; Slo-bid 100; Banana; and Tobramycin    All Medications:     Current Facility-Administered Medications   Medication Dose Route Frequency Provider Last Rate Last Dose   ??? albuterol 2.5 mg /3 mL (0.083 %) nebulizer solution 2.5 mg  2.5 mg Nebulization 4x Daily (RT) Blair Dolphin, MD   2.5 mg at 01/01/18 0950   ??? azithromycin (ZITHROMAX) tablet 500 mg  500 mg Oral Daily Latrelle Dodrill, MD   500 mg at 01/01/18 0856   ??? budesonide-formoterol (SYMBICORT) 160-4.5 mcg/actuation inhaler 2 puff  2 puff Inhalation BID Latrelle Dodrill, MD   2 puff at 01/01/18 8119   ??? busPIRone (BUSPAR) tablet 30 mg  30 mg Oral BID Latrelle Dodrill, MD   30 mg at 01/01/18 1478   ??? carvedilol (COREG) tablet 3.125 mg  3.125 mg Oral BID Latrelle Dodrill, MD   3.125 mg at 01/01/18 0856   ??? cetirizine (  ZyrTEC) tablet 10 mg  10 mg Oral Daily Latrelle Dodrill, MD   10 mg at 01/01/18 0856   ??? cholecalciferol (vitamin D3) tablet 1,000 Units  1,000 Units Oral Daily Latrelle Dodrill, MD   1,000 Units at 01/01/18 [id number]   ??? ciprofloxacin HCl (CIPRO) tablet 750 mg  750 mg Oral Q12H SCH Malachi Carl, MD   750 mg at 01/01/18 0856   ??? Clofazimine 50 mg capsule  2 capsule Oral Daily with lunch Latrelle Dodrill, MD   2 capsule at 01/01/18 1242   ??? dextrose 50 % in water (D50W) 50 % solution 12.5 g  12.5 g Intravenous Q10 Min PRN Latrelle Dodrill, MD       ??? dextrose 50 % in water (D50W) 50 % solution 12.5 g  12.5 g Intravenous Q10 Min PRN Latrelle Dodrill, MD       ??? dornase alfa (PULMOZYME) 1 mg/mL nebulizer solution 2.5 mg  2.5 mg Inhalation BID (RT) Latrelle Dodrill, MD   2.5 mg at 01/01/18 0950   ??? heparin, porcine (PF) 100 unit/mL injection 500 Units  500 Units Intravenous Q8H PRN Blair Dolphin, MD   500 Units at 01/01/18 [id number]   ??? insulin glargine (LANTUS) injection 12 Units  12 Units Subcutaneous Nightly Johnathan Hausen, MD   12 Units at 12/31/17 2227   ??? insulin lispro (HumaLOG) injection 0-2.5 Units  0-2.5 Units Subcutaneous Nightly Edger House, MD   1 Units at 12/31/17 2229   ??? insulin lispro (HumaLOG) injection 0-5 Units  0-5 Units Subcutaneous TID [facility] Edger House, MD   1 Units at 01/01/18 1241   ??? insulin lispro (HumaLOG) injection 8 Units  8 Units Subcutaneous TID AC Arcola Jansky, MD   8 Units at 01/01/18 1241   ??? lamoTRIgine (LaMICtal) tablet 100 mg  100 mg Oral Daily Latrelle Dodrill, MD   100 mg at 01/01/18 0856   ??? lisinopril (PRINIVIL,ZESTRIL) tablet 10 mg  10 mg Oral Nightly Latrelle Dodrill, MD   10 mg at 12/31/17 2048   ??? melatonin tablet 6 mg  6 mg Oral Nightly Latrelle Dodrill, MD   6 mg at 12/31/17 2046   ??? montelukast (SINGULAIR) tablet 10 mg  10 mg Oral Nightly Latrelle Dodrill, MD   10 mg at 12/31/17 2046   ??? MVW Complete (pediatric multivit 61-D3-vit K) 1,500-800 unit-mcg 1 capsule  1 capsule Oral BID Latrelle Dodrill, MD   1 capsule at 01/01/18 0855   ??? oxyCODONE (ROXICODONE) immediate release tablet 5 mg  5 mg Oral Q4H PRN Malachi Carl, MD   5 mg at 12/31/17 1719    Or   ??? oxyCODONE (ROXICODONE) immediate release tablet 10 mg  10 mg Oral Q4H PRN Malachi Carl, MD   10 mg at 01/01/18 0855   ??? pancrelipase (Lip-Prot-Amyl) (CREON) 24,000-76,000 -120,000 unit delayed release capsule 192,000-216,000 units of lipase  8-9 capsule Oral With snacks Latrelle Dodrill, MD       ??? pancrelipase (Lip-Prot-Amyl) (CREON) 24,000-76,000 -120,000 unit delayed release capsule 240,000-288,000 units of lipase  10-12 capsule Oral 3xd Meals Latrelle Dodrill, MD   288,000 units of lipase at 01/01/18 1242   ??? pantoprazole (PROTONIX) EC tablet 20 mg  20 mg Oral Daily Latrelle Dodrill, MD   20 mg at 01/01/18 0855   ??? polyethylene glycol (MIRALAX) packet 17 g  17 g Oral Daily PRN Latrelle Dodrill, MD       ??? pramipexole (MIRAPEX) tablet 0.25  mg  0.25 mg Oral Nightly Latrelle Dodrill, MD   0.25 mg at 12/31/17 2048   ??? pregabalin (LYRICA) capsule 300 mg  300 mg Oral BID Latrelle Dodrill, MD   300 mg at 01/01/18 0856   ??? promethazine (PHENERGAN) tablet 12.5 mg  12.5 mg Oral Q6H PRN Edger House, MD   12.5 mg at 01/01/18 1052   ??? rivaroxaban (XARELTO) tablet 20 mg  20 mg Oral Daily Latrelle Dodrill, MD   20 mg at 12/31/17 1719   ??? senna (SENOKOT) tablet 2 tablet  2 tablet Oral Nightly PRN Latrelle Dodrill, MD       ??? sodium chloride 3 % nebulizer solution 4 mL  4 mL Nebulization Q8H PRN Hitesh Molinda Bailiff, MD       ??? sodium chloride 7% nebulizer solution 4 mL  4 mL Nebulization 4x Daily (RT) Latrelle Dodrill, MD   4 mL at 01/01/18 0950   ??? tezacaftor 100mg /ivacaftor 150mg  and ivacaftor 150mg  (SYMDEKO) tablets  1 tablet Oral BID Latrelle Dodrill, MD   1 tablet at 01/01/18 1610   ??? tobramycin (NEBCIN) 360 mg in sodium chloride (NS) 0.9 % 100 mL IVPB  360 mg Intravenous Q12H Malachi Carl, MD 238 mL/hr at 01/01/18 0916 360 mg at 01/01/18 0916   ??? traZODone (DESYREL) tablet 100 mg  100 mg Oral Nightly Latrelle Dodrill, MD   100 mg at 12/31/17 2046   ??? vilazodone 40 mg tablet 40 mg  1 tablet Oral Daily Latrelle Dodrill, MD   40 mg at 01/01/18 9604       Social History:     Social History     Socioeconomic History   ??? Marital status: Married     Spouse name: None   ??? Number of children: None   ??? Years of education: None   ??? Highest education level: None   Occupational History   ??? None   Social Needs   ??? Financial resource strain: None   ??? Food insecurity:     Worry: None     Inability: None   ??? Transportation needs:     Medical: None     Non-medical: None   Tobacco Use   ??? Smoking status: Never Smoker   ??? Smokeless tobacco: Never Used   Substance and Sexual Activity   ??? Alcohol use: No   ??? Drug use: No   ??? Sexual activity: Yes     Partners: Female     Birth control/protection: Condom   Lifestyle   ??? Physical activity:     Days per week: None     Minutes per session: None   ??? Stress: None   Relationships   ??? Social connections:     Talks on phone: None     Gets together: None     Attends religious service: None     Active member of club or organization: None     Attends meetings of clubs or organizations: None     Relationship status: None   Other Topics Concern   ??? None   Social History Narrative   ??? None         Family History:    The patient's family history includes Bipolar disorder in his mother; Depression in his mother..    Code Status:    Full Code    Review of Systems:    A 12 system review of systems was negative except as noted in HPI.    Objective: :  Patient Vitals for the past 8 hrs:   BP Temp Temp src Pulse Resp SpO2   01/01/18 0541 128/73 35.5 ??C Oral 87 18 93 %       No intake/output data recorded.    Physical Exam:    General appearance - alert, well appearing, and in no distress  Mental status - alert, oriented to person, place, and time  Eyes -  Sclera anicteric, EOM's intact  Resp - normal WOB. Speaking in full sentences without difficulty or noted dyspnea.  Neurological - alert, oriented, normal speech, no grossly abnormal focal findings.  Psych - normal affect and behavior  Skin - No rash or lesions, no acanthosis    Test Results    Data Review  Lab Results   Component Value Date    A1C 6.1 (H) 11/19/2016    A1C 6.3 (H) 11/18/2016     Lab Results   Component Value Date    CREATININE 0.79 01/01/2018

## 2018-01-01 NOTE — Unmapped (Signed)
[facility] Inpatient Service  Daily Progress Note     Interval/Overnight/Subjective:      NAEON. Will assess chest pain with EKG, but low suspicion for cardiac etiology. Still awaiting AFB & CF sputum cultures to be drawn, so will touch base with RT in order to have these induced. Increased Lantus to 14U QHS per Endocrine rec's.     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 *    Christian Young is a 32 y.o. male with PMHx  of CF (genotype S945L and [id number]), DM,  HTN, pancreatic insufficiency, s/p right upper lobe who presented to [facility] with Cystic fibrosis with pulmonary exacerbation (CMS-HCC)    CF exacerbation w/ pulmonary manifestations, acute exacerbation of bronchiectasis: Genotype S945L and [id number], last FEV1 of 67.1 % on 3/14. Culture Hx of Sm PsA (sensitive to Cipro, Tobra, Intermediate to zosyn, levo, cefepime and aztreo), M. Abscessus, S. Maltophilia, and OPF. Hx of R-partial lobectomy August 2018.  At home on daily azithromycin, clofazimime, and inhaled amikacin for hx of M. Abscess. CT showed generalized bronchiectasis, with nodular tree-in-bud opacities in lower lobes. RPP on 5/19 was negative.  - AC: Pulmozyme BID, Albuterol nebs, 7 % HTS QID, Aerobika, Chest Vest   - f/u CF sputum culture, AFB cultures x 3 to rule out NTM, and fungal culture  - IV Tobramycin and PO Cipro (5/19 - )  - home Azithromycin and Clofazimine (history of NTM infection)   - hold inhaled Amikacin in setting of IV Tobra   - Symdeko BID   - Symbicort BID    CF-related Pancreatic Insufficiency.  - Creon (7 - 8 tablets w/ meals, 5 -6 tablets with snacks)  - MVW BID, vitamin D3 1000 U daily  - Hold home pro-biotics     CF GI Disease: Patient reports distant history of requiring Golytely for constipation. Denies current symptoms.   - PRN miralax and senna     History of DVT: UE clots a/w PICC line, hx of unprovoked LE DVTs.   - home Xarelto    CFRD.  - Endocrinology consulted  - Lantus 14U QD  - Lispro to 8U qAC  - Lispro sensitive qACHS    Chronic stable medical conditions:   HTN: home Lisinopril and Coreg  Chronic Pain: Lyrica 300mg  BID, Tramadol 100mg  q6hrs PRN   Chronic Sinusitis: Zyrtec, Singulair  RLS: Home pramipexole  Depression: Buspar, vilazodone, lamotrigine  Insomnia: Melatonin 9mg , Trazodone 100mg      Daily Checklist:   Diet: regular  GI ppx: home PPI  DVT ppx: home Xarelto  Dispo: [facility], floor status  Code Status: Full Code  ___________________________________________________________________    Labs/Studies:  Labs and Studies from the last 24hrs per EMR and Reviewed    Physical Exam:  Temp:  [35.9 ??C (96.6 ??F)-37 ??C (98.6 ??F)] 35.9 ??C (96.6 ??F)  Heart Rate:  [87-98] 88  Resp:  [16-20] 16  BP: (121-139)/(66-92) 121/66  SpO2:  [93 %-99 %] 99 %, BP 121/66  - Pulse 88  - Temp 35.9 ??C (96.6 ??F) (Oral)  - Resp 16  - Ht 182.9 cm (6')  - Wt (!) 119.2 kg (262 lb 12.6 oz)  -  SpO2 99%  - BMI 35.64 kg/m?? , Body mass index is 35.64 kg/m??.     Physical Exam:  GEN: no acute distress  PULM: Normal respiratory effort. decreased breath sounds at right base.  CV: RRR  ABD: soft, NT/ND  EXT:  No clubbing, cyanosis or edema  NEURO: nonfocal, alert, oriented

## 2018-01-02 LAB — BASIC METABOLIC PANEL
ANION GAP: 11 mmol/L (ref 9–15)
BLOOD UREA NITROGEN: 15 mg/dL (ref 7–21)
BUN / CREAT RATIO: 18
CALCIUM: 9 mg/dL (ref 8.5–10.2)
CHLORIDE: 101 mmol/L (ref 98–107)
CO2: 26 mmol/L (ref 22.0–30.0)
CREATININE: 0.85 mg/dL (ref 0.70–1.30)
EGFR MDRD NON AF AMER: >=60 mL/min/{1.73_m2} (ref >=60)
GLUCOSE RANDOM: 151 mg/dL (ref 65–179)
POTASSIUM: 4 mmol/L (ref 3.5–5.0)
SODIUM: 138 mmol/L (ref 135–145)

## 2018-01-02 LAB — CBC
HEMATOCRIT: 38.1 % — ABNORMAL LOW (ref 41.0–53.0)
MEAN CORPUSCULAR HEMOGLOBIN CONC: 30.9 g/dL — ABNORMAL LOW (ref 31.0–37.0)
MEAN CORPUSCULAR VOLUME: 79.9 fL — ABNORMAL LOW (ref 80.0–100.0)
MEAN PLATELET VOLUME: 8.6 fL (ref 7.0–10.0)
PLATELET COUNT: 266 10*9/L (ref 150–440)
RED BLOOD CELL COUNT: 4.77 10*12/L (ref 4.50–5.90)
RED CELL DISTRIBUTION WIDTH: 16.1 % — ABNORMAL HIGH (ref 12.0–15.0)
WBC ADJUSTED: 6.4 10*9/L (ref 4.5–11.0)

## 2018-01-02 LAB — TOBRAMYCIN RANDOM
Tobramycin:MCnc:Pt:Ser/Plas:Qn:: 1.5
Tobramycin:MCnc:Pt:Ser/Plas:Qn:: 13.8

## 2018-01-02 LAB — RED BLOOD CELL COUNT: Lab: 4.77

## 2018-01-02 LAB — CALCIUM: Calcium:MCnc:Pt:Ser/Plas:Qn:: 9

## 2018-01-02 NOTE — Unmapped (Signed)
[facility] Inpatient Service  Daily Progress Note     Interval/Overnight/Subjective:      NAEON. EKG was normal  Still awaiting AFB & CF sputum cultures to be drawn, so will touch base with RT in order to have these induced. Still reports of pain in chest, joints and other large muscles.     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 *    Christian Young is a 32 y.o. male with PMHx  of CF (genotype S945L and [id number]), DM,  HTN, pancreatic insufficiency, s/p right upper lobe who presented to [facility] with Cystic fibrosis with pulmonary exacerbation (CMS-HCC)    CF exacerbation w/ pulmonary manifestations, acute exacerbation of bronchiectasis: Genotype S945L and [id number], last FEV1 of 67.1 % on 3/14. Culture Hx of Sm PsA (sensitive to Cipro, Tobra, Intermediate to zosyn, levo, cefepime and aztreo), M. Abscessus, S. Maltophilia, and OPF. Hx of R-partial lobectomy August 2018.  At home on daily azithromycin, clofazimime, and inhaled amikacin for hx of M. Abscess. CT showed generalized bronchiectasis, with nodular tree-in-bud opacities in lower lobes. RPP on 5/19 was negative.  - AC: Pulmozyme BID, Albuterol nebs, 7 % HTS QID, Aerobika, Chest Vest   - f/u CF sputum culture, AFB cultures x 3 to rule out NTM, and fungal culture  - Induced stupum  - IV Tobramycin and PO Cipro (5/19 - )  - home Azithromycin and Clofazimine (history of NTM infection)   - hold inhaled Amikacin in setting of IV Tobra   - Symdeko BID   - Symbicort BID    CF-related Pancreatic Insufficiency.  - Creon (7 - 8 tablets w/ meals, 5 -6 tablets with snacks)  - MVW BID, vitamin D3 1000 U daily  - Hold home pro-biotics     CF GI Disease: Patient reports distant history of requiring Golytely for constipation. Denies current symptoms.   - PRN miralax and senna     History of DVT: UE clots a/w PICC line, hx of unprovoked LE DVTs.   - home Xarelto    CFRD.  - Endocrinology consulted  - Lantus 14U QD  - Lispro to 8U qAC  - Lispro sensitive qACHS    Chronic stable medical conditions:   HTN: home Lisinopril and Coreg  Chronic Pain: Lyrica 300mg  BID, Tramadol 100mg  q6hrs PRN   Chronic Sinusitis: Zyrtec, Singulair  RLS: Home pramipexole  Depression: Buspar, vilazodone, lamotrigine  Insomnia: Melatonin 9mg , Trazodone 100mg      Daily Checklist:   Diet: regular  GI ppx: home PPI  DVT ppx: home Xarelto  Dispo: [facility], floor status. Is planning of staying for entire course.  Code Status: Full Code    ___________________________________________________________________    Labs/Studies:  Labs and Studies from the last 24hrs per EMR and Reviewed    Physical Exam:  Vitals:    01/02/18 0520   BP: 132/58   Pulse: 81   Resp: 21   Temp: 35.8 ??C (96.4 ??F)   SpO2: 95%     Physical Exam:  GEN: no acute distress  PULM: Normal respiratory effort. decreased breath sounds at right base.  CV: RRR  ABD: soft, NT/ND  EXT:  No clubbing,  cyanosis or edema  NEURO: nonfocal, alert, oriented     Christian Young, MS3  ___________________________    I attest that I have reviewed the student note and that the components of the history of the present illness, the physical exam, and the assessment and plan documented were performed by me or were performed in my presence by the student where I verified the documentation and performed (or re-performed) the exam and medical decision making.    Silas Sacramento, MD  Internal Medicine PGY-1  Pager: [phone]

## 2018-01-02 NOTE — Unmapped (Signed)
Pt A/Ox4. Pt compliant with scheduled medications. PRN oxycodone administered for pain with relief. PRN phenergan administered x1 with relief. Pt request to have stationary bike in room and ambulated in hall this shift. VSS on room air. Pt still unable to provide sputum sample for AFB as of yet. Will continue POC.      Problem: Adult Inpatient Plan of Care  Goal: Plan of Care Review  Outcome: Progressing  Goal: Patient-Specific Goal (Individualization)  Outcome: Progressing  Goal: Absence of Hospital-Acquired Illness or Injury  Outcome: Progressing  Goal: Optimal Comfort and Wellbeing  Outcome: Progressing  Goal: Readiness for Transition of Care  Outcome: Progressing  Goal: Rounds/Family Conference  Outcome: Progressing     Problem: Diabetes Comorbidity  Goal: Blood Glucose Level Within Desired Range  Outcome: Progressing     Problem: Pain Chronic (Persistent) (Comorbidity Management)  Goal: Acceptable Pain Control and Functional Ability  Outcome: Progressing     Problem: Adjustment to Illness (Cystic Fibrosis)  Goal: Optimal Coping  Outcome: Progressing     Problem: Infection (Cystic Fibrosis)  Goal: Absence of Infection Signs/Symptoms  Outcome: Progressing     Problem: Malabsorption (Cystic Fibrosis)  Goal: Optimal Bowel Elimination  Outcome: Progressing     Problem: Oral Intake Inadequate (Cystic Fibrosis)  Goal: Optimal Nutrition Intake  Outcome: Progressing     Problem: Respiratory Compromise (Cystic Fibrosis)  Goal: Effective Oxygenation and Ventilation  Outcome: Progressing

## 2018-01-02 NOTE — Unmapped (Signed)
No acute issues to report. Gave prn oxycodone for chest/rib cage pain. Pain relieved. Requested bed linen change and patient helped changed the bed linen with nursing staff. Joked about how this count for ambulating around the room and preventing PEs/DVTs. Will continue to monitor and follow POC.     Problem: Adult Inpatient Plan of Care  Goal: Plan of Care Review  Outcome: Progressing  Goal: Patient-Specific Goal (Individualization)  Outcome: Progressing  Flowsheets  Taken 01/01/2018 0343  Anxieties, Fears or Concerns: Concern about port clotting off.  Taken 01/02/2018 0410  Patient-Specific Goals (Include Timeframe): Patient will not have complications with his CVAD patency for tonight.  Individualized Care Needs: Requested a bed linen change.  Goal: Absence of Hospital-Acquired Illness or Injury  Outcome: Progressing  Intervention: Prevent VTE (venous thromboembolism)  Flowsheets (Taken 01/02/2018 0410)  VTE Prevention/Management: ambulation promoted;anticoagulation therapy  Intervention: Prevent Infection  Flowsheets (Taken 01/02/2018 0410)  Infection Prevention: rest/sleep promoted;environmental surveillance performed;handwashing promoted;single patient room provided  Goal: Optimal Comfort and Wellbeing  Outcome: Progressing  Intervention: Monitor Pain and Promote Comfort  Flowsheets (Taken 01/02/2018 0410)  Pain Management Interventions: pain management plan reviewed with patient/caregiver  Intervention: Provide Person-Centered Care  Flowsheets (Taken 01/02/2018 0410)  Trust Relationship/Rapport: care explained;choices provided  Goal: Readiness for Transition of Care  Outcome: Progressing  Goal: Rounds/Family Conference  Outcome: Progressing     Problem: Diabetes Comorbidity  Goal: Blood Glucose Level Within Desired Range  Outcome: Progressing  Intervention: Maintain Glycemic Control  Flowsheets (Taken 01/02/2018 0410)  Glycemic Management: blood glucose monitoring;insulin dose matched to carbohydrate intake;supplemental insulin given     Problem: Pain Chronic (Persistent) (Comorbidity Management)  Goal: Acceptable Pain Control and Functional Ability  Outcome: Progressing  Intervention: Develop Pain Management Plan  Flowsheets (Taken 01/02/2018 0410)  Pain Management Interventions: pain management plan reviewed with patient/caregiver  Intervention: Manage Persistent Pain  Flowsheets (Taken 01/02/2018 0410)  Sleep/Rest Enhancement: awakenings minimized;noise level reduced;regular sleep/rest pattern promoted  Medication Review/Management: medications reviewed  Intervention: Optimize Psychosocial Wellbeing  Flowsheets (Taken 01/02/2018 0410)  Supportive Measures: self-care encouraged;self-responsibility promoted;verbalization of feelings encouraged  Family/Support System Care: self-care encouraged     Problem: Adjustment to Illness (Cystic Fibrosis)  Goal: Optimal Coping  Outcome: Progressing  Intervention: Support Psychosocial Response to Chronic Condition  Flowsheets (Taken 01/02/2018 0410)  Supportive Measures: self-care encouraged;self-responsibility promoted;verbalization of feelings encouraged     Problem: Infection (Cystic Fibrosis)  Goal: Absence of Infection Signs/Symptoms  Outcome: Progressing  Intervention: Manage Infection and Prevent Transmission  Flowsheets  Taken 01/01/2018 2100 by Thurston Pounds  Isolation Precautions: contact precautions maintained  Taken 01/02/2018 0410 by Susa Raring, RN  Infection Management: aseptic technique maintained  Fever Reduction/Comfort Measures: medication administered     Problem: Malabsorption (Cystic Fibrosis)  Goal: Optimal Bowel Elimination  Outcome: Progressing  Intervention: Optimize Nutrient Absorption  Flowsheets (Taken 01/02/2018 0410)  Bowel Dysfunction Management: hygiene measures promoted  Medication Review/Management: medications reviewed     Problem: Oral Intake Inadequate (Cystic Fibrosis)  Goal: Optimal Nutrition Intake  Outcome: Progressing Intervention: Promote and Optimize Nutrition Intake  Flowsheets (Taken 01/02/2018 0410)  Oral Nutrition Promotion: calorie dense foods provided     Problem: Respiratory Compromise (Cystic Fibrosis)  Goal: Effective Oxygenation and Ventilation  Outcome: Progressing  Intervention: Promote Airway Secretion Clearance  Flowsheets (Taken 01/02/2018 0410)  Breathing Techniques/Airway Clearance: deep/controlled cough encouraged  Cough And Deep Breathing: done independently per patient  Intervention: Optimize Oxygenation and Ventilation  Flowsheets (Taken 01/02/2018 0410)  Head of Bed (  HOB): HOB elevated  Airway/Ventilation Management: airway patency maintained;pulmonary hygiene promoted;humidification applied

## 2018-01-02 NOTE — Unmapped (Addendum)
Aminoglycoside Therapeutic Monitoring Pharmacy Note    Christian Young is a 32 y.o. male continuing tobramycin. Date of therapy initiation: 12/29/17    Indication: CF exacerbation    Prior Dosing Information: Current regimen 360 mg q12 (regimen changed to conventional on 5/21)     Goals:  Therapeutic Drug Levels  ?? Trough level: tobramycin <1 mg/L  ?? Peak level: tobramycin 10-14 mg/L, may go up to 16 mg/L if tolerated    Additional Clinical Monitoring/Outcomes  Renal function, volume status (intake and output)    Results:   ?? Random level: 1.5 mg/L, drawn 4 hours from next dose (Extrapolated trough: 0.27 mg/L)   ?? Peak level: 13.8 mg/L, drawn 1 hours post dose (Extrapolated peak: 19.4 mg/L)    Wt Readings from Last 1 Encounters:   12/29/17 (!) 119.2 kg (262 lb 12.6 oz)     Lab Results   Component Value Date    CREATININE 0.85 01/02/2018       Pharmacokinetic Considerations and Significant Drug Interactions:  ? Adult (Calculated on 01/02/18): Ke = 0.38 hr-1  ? Concurrent nephrotoxic meds: lisinopril    Assessment/Plan:  Recommended Dose  ? Change current regimen to 250 mg IV q12h, will change to 1-hr infusion  ? Estimated peak and trough on recommended regimen: peak = 12.4 mg/L, trough = 0.3 mg/L    Follow-up  ? Level due: in 2 days  ? A pharmacist will continue to monitor and order levels as appropriate    Please page service pharmacist with questions/clarifications.    Laretta Alstrom, PharmD

## 2018-01-03 LAB — ALBUMIN: Albumin:MCnc:Pt:Ser/Plas:Qn:: 3.8

## 2018-01-03 LAB — BASIC METABOLIC PANEL
ANION GAP: 13 mmol/L (ref 9–15)
BLOOD UREA NITROGEN: 15 mg/dL (ref 7–21)
BUN / CREAT RATIO: 15
CALCIUM: 8.5 mg/dL (ref 8.5–10.2)
CHLORIDE: 100 mmol/L (ref 98–107)
CO2: 25 mmol/L (ref 22.0–30.0)
CREATININE: 0.97 mg/dL (ref 0.70–1.30)
EGFR MDRD AF AMER: >=60 mL/min/{1.73_m2} (ref >=60)
EGFR MDRD NON AF AMER: >=60 mL/min/{1.73_m2} (ref >=60)
GLUCOSE RANDOM: 154 mg/dL (ref 65–179)
POTASSIUM: 4.1 mmol/L (ref 3.5–5.0)
SODIUM: 138 mmol/L (ref 135–145)

## 2018-01-03 LAB — CBC
HEMATOCRIT: 37.4 % — ABNORMAL LOW (ref 41.0–53.0)
MEAN CORPUSCULAR HEMOGLOBIN CONC: 31.9 g/dL (ref 31.0–37.0)
MEAN CORPUSCULAR HEMOGLOBIN: 25.4 pg — ABNORMAL LOW (ref 26.0–34.0)
MEAN CORPUSCULAR VOLUME: 79.8 fL — ABNORMAL LOW (ref 80.0–100.0)
MEAN PLATELET VOLUME: 8.9 fL (ref 7.0–10.0)
PLATELET COUNT: 269 10*9/L (ref 150–440)
RED BLOOD CELL COUNT: 4.69 10*12/L (ref 4.50–5.90)
RED CELL DISTRIBUTION WIDTH: 16.1 % — ABNORMAL HIGH (ref 12.0–15.0)

## 2018-01-03 LAB — POTASSIUM: Potassium:SCnc:Pt:Ser/Plas:Qn:: 4.1

## 2018-01-03 LAB — HEPATIC FUNCTION PANEL
ALKALINE PHOSPHATASE: 66 U/L (ref 38–126)
ALT (SGPT): 110 U/L — ABNORMAL HIGH (ref 19–72)
AST (SGOT): 65 U/L — ABNORMAL HIGH (ref 19–55)
BILIRUBIN TOTAL: 0.5 mg/dL (ref 0.0–1.2)
PROTEIN TOTAL: 6.6 g/dL (ref 6.5–8.3)

## 2018-01-03 LAB — TOBRAMYCIN RANDOM: Tobramycin:MCnc:Pt:Ser/Plas:Qn:: 1.1

## 2018-01-03 LAB — WBC ADJUSTED: Lab: 7.8

## 2018-01-03 NOTE — Unmapped (Signed)
Patient is on room air and using the [place] for airway clearance.  BBS are clear.  Patient is compliant with all inhaled respiratory medications.  Will continue to monitor.

## 2018-01-03 NOTE — Unmapped (Signed)
Cystic Fibrosis Nutrition Assessment    Inpatient: MD Consult this admission and related follow up  Primary Pulmonologist: Dr. Koren Shiver  ===================================================================  Christian Young is a 32 y.o. male seen for medical nutrition therapy. Currently admitted with CF exacerbation for IV antibiotics.    ===================================================================  INTERVENTION  1. During admission continue Creon 24,000 (10-12 at meals, 8-9 at snacks).  - Can resume home Creon 36,000 after discharge.    2. Recommend increase supplemental vitamin D (only during admission) to 6000 International units vitamin D3 daily.  Continue inpatient CF vitamin regimen:  MVW Complete Formulation gel cap 2 daily   - note inpatient CF vitamin + 6000 International units vitamin D daily daily = total of 9000 International units vitamin D daily.  - When discharged home resume usual Complete Formulation D5000 softgels, 2 daily. Provides total of 10,000 International units vitamin D daily..    3. Weigh patient twice weekly this admit.    4. Recommend check PT. If elevated start 5mg  phytonadione daily x 5 days, then recheck PT. If WNL start 5mg  phytonadione twice weekly while on IV antibiotics.    5. Continue remainder of nutrition regimen:  - acid reducer  - bowel regimen  -high calorie high protein diet   -insulin per Endo    Inpatient:   Will follow up with patient per protocol: 1-2 times per week (and more frequent as indicated)===================================================================  ASSESSMENT:  Cystic Fibrosis Nutrition Category = Outstanding    Current diet is appropriate for CF. Patient continues to work towards goals for weight management.   Enzyme dose is within established guidelines. Vitamin prescription is not appropriate to reach/maintain optimal fat soluble vitamin levels. Patient would benefit from change in vitamin regimen. Sodium needs for CF met with PO and/or supplement. Bowel regimen is appropriate. Acid reducer appropriate for GERD and enzyme activation.    Malnutrition Assessment using AND/ASPEN Clinical Characteristics:  Patient does not meet AND/ASPEN criteria for malnutrition at this time (12/30/17 1519)    Malnutrition Assessment using AND/ASPEN Clinical Characteristics:  Patient does not meet AND/ASPEN criteria for malnutrition at this time (12/30/17 1519)    Goals:  1. Meet estimated daily needs: 1478-2956 kcals (24-28 kcals/kg/day); 142-185 gm pro (DRI x 1.5 -2)); 3470 mL free water (Holliday Segar Method)  2. Reach/maintain established goals for CF:                Adult - BMI 22kg/m2 for CF females and 23kg/m2 for CF males  3. Normal fat-soluble vitamin levels: Vitamin A, Vitamin E and PT per lab range; Vitamin D 25OH total >30  4. Maintain glucose control. Carbohydrate content of diet should comprise 40-50% of total calorie needs, but carbohydrates are not restricted in this population.    5.  Meet sodium needs for CF  ===================================================================  INPATIENT:    Current Nutrition Orders (inpatient):       Nutrition Orders   (From admission, onward)            Start     Ordered    12/31/17 2348  Nutrition Therapy High Calorie High Protein  Effective now     Question:  Nutrition Therapy (T):  Answer:  High Calorie High Protein    12/31/17 2348        CF Nutrition related medications (inpatient): Nutritionally relevant medications reviewed.     Current Facility-Administered Medications:   ???  albuterol 2.5 mg /3 mL (0.083 %) nebulizer solution 2.5 mg, 2.5 mg, Nebulization, 4x Daily (  RT), Blair Dolphin, MD, 2.5 mg at 01/03/18 0820  ???  azithromycin [facility]) tablet 500 mg, 500 mg, Oral, Daily, Latrelle Dodrill, MD, 500 mg at 01/03/18 1610  ???  budesonide-formoterol (SYMBICORT) 160-4.5 mcg/actuation inhaler 2 puff, 2 puff, Inhalation, BID, Latrelle Dodrill, MD, 2 puff at 01/03/18 0820  ???  busPIRone (BUSPAR) tablet 30 mg, 30 mg, Oral, BID, Latrelle Dodrill, MD, 30 mg at 01/03/18 9604  ???  carvedilol (COREG) tablet 3.125 mg, 3.125 mg, Oral, BID, Latrelle Dodrill, MD, 3.125 mg at 01/03/18 5409  ???  cetirizine (ZyrTEC) tablet 10 mg, 10 mg, Oral, Daily, Latrelle Dodrill, MD, 10 mg at 01/03/18 8119  ???  cholecalciferol (vitamin D3) tablet 1,000 Units, 1,000 Units, Oral, Daily, Latrelle Dodrill, MD, 1,000 Units at 01/03/18 [phone]  ???  ciprofloxacin HCl (CIPRO) tablet 750 mg, 750 mg, Oral, Q12H SCH, Malachi Carl, MD, 750 mg at 01/03/18 0905  ???  Clofazimine 50 mg capsule, 2 capsule, Oral, Daily with lunch, Latrelle Dodrill, MD, 2 capsule at 01/03/18 1227  ???  dextrose (D10W) 10% bolus 125 mL, 12.5 g, Intravenous, Q30 Min PRN, Blair Dolphin, MD  ???  dornase alfa (PULMOZYME) 1 mg/mL nebulizer solution 2.5 mg, 2.5 mg, Inhalation, BID (RT), Latrelle Dodrill, MD, 2.5 mg at 01/03/18 0820  ???  heparin, porcine (PF) 100 unit/mL injection 500 Units, 500 Units, Intravenous, Q8H PRN, Blair Dolphin, MD, 500 Units at 01/01/18 1942  ???  insulin glargine (LANTUS) injection 14 Units, 14 Units, Subcutaneous, Nightly, Edger House, MD, 14 Units at 01/02/18 2054  ???  insulin lispro (HumaLOG) injection 0-2.5 Units, 0-2.5 Units, Subcutaneous, Nightly, Edger House, MD, 2 Units at 01/02/18 2056  ???  insulin lispro (HumaLOG) injection 0-5 Units, 0-5 Units, Subcutaneous, TID AC, Edger House, MD, 1 Units at 01/03/18 [phone]  ???  insulin lispro (HumaLOG) injection 8 Units, 8 Units, Subcutaneous, TID AC, Arcola Jansky, MD, 8 Units at 01/03/18 1225  ???  lamoTRIgine (LaMICtal) tablet 100 mg, 100 mg, Oral, Daily, Latrelle Dodrill, MD, 100 mg at 01/03/18 2130  ???  lisinopril (PRINIVIL,ZESTRIL) tablet 10 mg, 10 mg, Oral, Nightly, Latrelle Dodrill, MD, 10 mg at 01/02/18 1948  ???  melatonin tablet 6 mg, 6 mg, Oral, Nightly, Latrelle Dodrill, MD, 6 mg at 01/02/18 2057  ???  montelukast (SINGULAIR) tablet 10 mg, 10 mg, Oral, Nightly, Latrelle Dodrill, MD, 10 mg at 01/02/18 2057  ???  MVW Complete (pediatric multivit 61-D3-vit K) 1,500-800 unit-mcg 1 capsule, 1 capsule, Oral, BID, Latrelle Dodrill, MD, 1 capsule at 01/03/18 8657  ???  oxyCODONE (ROXICODONE) immediate release tablet 5 mg, 5 mg, Oral, Q4H PRN, 5 mg at 01/02/18 1222 **OR** oxyCODONE (ROXICODONE) immediate release tablet 10 mg, 10 mg, Oral, Q4H PRN, Malachi Carl, MD, 10 mg at 01/03/18 8469  ???  pancrelipase (Lip-Prot-Amyl) (CREON) 24,000-76,000 -120,000 unit delayed release capsule 192,000-216,000 units of lipase, 8-9 capsule, Oral, With snacks, Latrelle Dodrill, MD  ???  pancrelipase (Lip-Prot-Amyl) (CREON) 24,000-76,000 -120,000 unit delayed release capsule 240,000-288,000 units of lipase, 10-12 capsule, Oral, 3xd Meals, Latrelle Dodrill, MD, 240,000 units of lipase at 01/03/18 1227  ???  pantoprazole (PROTONIX) EC tablet 20 mg, 20 mg, Oral, Daily, Latrelle Dodrill, MD, 20 mg at 01/03/18 6295  ???  polyethylene glycol (MIRALAX) packet 17 g, 17 g, Oral, Daily PRN, Latrelle Dodrill, MD  ???  pramipexole (MIRAPEX) tablet 0.25 mg, 0.25 mg, Oral, Nightly, Latrelle Dodrill, MD, 0.25 mg at 01/02/18 2057  ???  pregabalin (LYRICA)  capsule 300 mg, 300 mg, Oral, BID, Latrelle Dodrill, MD, 300 mg at 01/03/18 4098  ???  promethazine (PHENERGAN) tablet 12.5 mg, 12.5 mg, Oral, Q6H PRN, Edger House, MD, 12.5 mg at 01/02/18 1102  ???  rivaroxaban (XARELTO) tablet 20 mg, 20 mg, Oral, Daily, Latrelle Dodrill, MD, 20 mg at 01/02/18 1836  ???  senna (SENOKOT) tablet 2 tablet, 2 tablet, Oral, Nightly PRN, Latrelle Dodrill, MD  ???  [CANCELED] AFB culture, , , Once **AND** [CANCELED] Sputum induction, , , Once **AND** sodium chloride 3 % nebulizer solution 4 mL, 4 mL, Nebulization, Q8H PRN, Edger House, MD  ???  sodium chloride 7% nebulizer solution 4 mL, 4 mL, Nebulization, 4x Daily (RT), Latrelle Dodrill, MD, 4 mL at 01/03/18 0820  ???  tezacaftor 100mg /ivacaftor 150mg  and ivacaftor 150mg  (SYMDEKO) tablets, 1 tablet, Oral, BID, Latrelle Dodrill, MD, 1 tablet at 01/03/18 0908  ???  tobramycin (NEBCIN) 250 mg in sodium chloride (NS) 0.9 % 100 mL IVPB, 250 mg, Intravenous, Q12H, Last Rate: 116.3 mL/hr at 01/03/18 0930, 250 mg at 01/03/18 0930 **AND** Inpatient consult to Pharmacy RX to dose: tobramycin, , , Once, Edger House, MD  ???  traZODone (DESYREL) tablet 100 mg, 100 mg, Oral, Nightly, Latrelle Dodrill, MD, 100 mg at 01/02/18 2057  ???  vilazodone 40 mg tablet 40 mg, 1 tablet, Oral, Daily, Latrelle Dodrill, MD, 40 mg at 01/03/18 1191    CF Nutrition related labs (inpatient):  POC ( 5-24) 152, 146  POC (5-20) 235  POC (5-19) 128    Last 5 Recorded Weights    12/29/17 2100   Weight: (!) 119.2 kg (262 lb 12.6 oz)   ==================================================================  CLINICAL DATA:  Past Medical History:   Diagnosis Date   ??? Anxiety    ??? Chronic pain disorder    ??? Cystic fibrosis (CMS-HCC)    ??? Depression    ??? Hypertension    - s/p RUL lobectomy 03-29-17  - Per review of Care Everywhere PMHx also includes: CFRD, PI, essential HTN    Anthroprometric Evaluation:  Weight changes: Weight down 11 pounds over 2 months intentionally. He would like to continue working towards more weight loss.    BMI Readings from Last 1 Encounters:   12/29/17 35.64 kg/m??     Wt Readings from Last 3 Encounters:   12/29/17 (!) 119.2 kg (262 lb 12.6 oz)   10/23/17 (!) 123.9 kg (273 lb 3.2 oz)   10/08/17 (!) 124 kg (273 lb 4.8 oz)     Ht Readings from Last 3 Encounters:   12/29/17 182.9 cm (6')   10/23/17 183 cm (6' 0.05)   10/08/17 183 cm (6' 0.05)   ==================================================================  Energy Intake (outpatient):  Diet: High in calories, fat, salt. Diet evaluated this visit. Reports decreased appetite with current illness.  Food allergies: Per review of Care Everywhere note banana is listed as allergy.  Diet and CFTR modulators: Prescribed Symdeko (tezacaftor/ivacaftor).    PO Supplements: none  Appetite Stimulant: none  Enteral feeding tube: n/a  Sodium in diet: Adequate from diet  Calcium in diet:  Adequate from diet     Physical Activity: minimal since surgery in August. Has gym membership. Has attended pulmonary rehab in the past.    Fat Malabsorption (outpatient):  Enzyme brand, (meals/snacks):   Creon 36,000 (7 at meals, 5 at snacks)   - Switched from Zenpep to Creon due to report of bloating & greasy stools when on Zenpep.   Enzyme  administration details: correct pre-meal administration., moderate compliance, swallows capsules whole  Enzyme dose per MEAL (units lipase/kg/meal) 2100  Enzyme dose per DAY (units lipase/kg/day) 10936  Stools: denies s/s of malabsorption/diarrhea/constipation  Abdominal pain: None reported  Fecal Fat Studies:    Pancreatic Elastase-1   Date Value Ref Range Status   03/19/2017 <15 (L) mcg/g Final     Comment:     Adult and Pediatric Reference Ranges for    Pancreatic Elastase-1:                  Normal:      >200 mcg/g  Moderate Pancreatic        Insufficiency:   100-200 mcg/g    Severe Pancreatic        Insufficiency:      <100 mcg/g     Elastase-1 (E-1) assay results are expressed  in mcg/g, which represent mcg E1/g feces.     It is not necessary to interrupt enzyme  substitution therapy.     Test Performed by:  [facility]  [address]  [place], [place] [address]     GI meds: Nutritionally relevant medications reviewed. MVW probiotic, omeprazole.    Vitamins/Minerals (outpatient):  CF-specific MVI, dose, compliance: MVW Complete Formulation Softgel D-5000 2 daily  Other vitamins/minerals/herbals: none  Calcium supplement: none  Patient Resources: -[employer] (phone [phone] [email or web address])   - Orders CF vitamin and probiotic directly from manufacturer (MVW), cost billed directly to [employer] grant  - He is thinks he may also be enrolled in [employer] but he is unsure at this time.  Fat-soluble vitamin levels:  Lab Results   Component Value Date/Time    VITAMINA 44.4 11/19/2016 0647     Lab Results   Component Value Date/Time    VITDTOTAL 27.9 03/19/2017 0508        Lab Results   Component Value Date/Time    PT 12.9 (H) 08/19/2017 1340    PT 11.1 03/29/2017 0508    PT 11.5 03/28/2017 0519    PT 14.1 (H) 11/29/2016 0807    PT 15.3 (H) 11/26/2016 0529   03-20-17: normal DesCarboxyPT (also known as PIVKA) indicates no vitamin K deficiency.      Bone Health: unknown     CF Related Diabetes: Yes, Per Care EveryWhere has been on 5 units levemir at bedtime in the past. Also noted per Care Everywhere he was supposed to have had Endo appointment at outside facility in March 2018 - unclear if he made it to this appointment.        Lab Results   Component Value Date/Time    A1C 6.1 (H) 11/19/2016 8469

## 2018-01-03 NOTE — Unmapped (Signed)
No acute issues to report. Received prn oxycodone for pain. Pain relieved. Patient slept during the shift.       Problem: Adult Inpatient Plan of Care  Goal: Plan of Care Review  Outcome: Progressing  Goal: Patient-Specific Goal (Individualization)  Outcome: Progressing  Goal: Absence of Hospital-Acquired Illness or Injury  Outcome: Progressing  Intervention: Prevent VTE (venous thromboembolism)  Flowsheets (Taken 01/03/2018 0350)  VTE Prevention/Management: ambulation promoted;anticoagulation therapy  Intervention: Prevent Infection  Flowsheets (Taken 01/03/2018 0350)  Infection Prevention: handwashing promoted;single patient room provided;rest/sleep promoted  Goal: Optimal Comfort and Wellbeing  Outcome: Progressing  Goal: Readiness for Transition of Care  Outcome: Progressing  Goal: Rounds/Family Conference  Outcome: Progressing     Problem: Diabetes Comorbidity  Goal: Blood Glucose Level Within Desired Range  Outcome: Progressing  Intervention: Maintain Glycemic Control  Flowsheets (Taken 01/03/2018 0350)  Glycemic Management: blood glucose monitoring;supplemental insulin given     Problem: Pain Chronic (Persistent) (Comorbidity Management)  Goal: Acceptable Pain Control and Functional Ability  Outcome: Progressing  Intervention: Develop Pain Management Plan  Flowsheets (Taken 01/03/2018 0350)  Pain Management Interventions: pain management plan reviewed with patient/caregiver;quiet environment facilitated  Intervention: Manage Persistent Pain  Flowsheets (Taken 01/02/2018 0410)  Medication Review/Management: medications reviewed     Problem: Adjustment to Illness (Cystic Fibrosis)  Goal: Optimal Coping  Outcome: Progressing     Problem: Infection (Cystic Fibrosis)  Goal: Absence of Infection Signs/Symptoms  Outcome: Progressing  Intervention: Manage Infection and Prevent Transmission  Flowsheets  Taken 01/02/2018 2100 by Thurston Pounds  Isolation Precautions: contact precautions maintained  Taken 01/03/2018 0350 by Susa Raring, RN  Infection Management: aseptic technique maintained  Fever Reduction/Comfort Measures: medication administered     Problem: Malabsorption (Cystic Fibrosis)  Goal: Optimal Bowel Elimination  Outcome: Progressing     Problem: Oral Intake Inadequate (Cystic Fibrosis)  Goal: Optimal Nutrition Intake  Outcome: Progressing     Problem: Respiratory Compromise (Cystic Fibrosis)  Goal: Effective Oxygenation and Ventilation  Outcome: Progressing  Intervention: Promote Airway Secretion Clearance  Flowsheets  Taken 01/02/2018 0410  Cough And Deep Breathing: done independently per patient  Taken 01/03/2018 0350  Breathing Techniques/Airway Clearance: deep/controlled cough encouraged  Intervention: Optimize Oxygenation and Ventilation  Flowsheets (Taken 01/03/2018 0350)  Airway/Ventilation Management: airway patency maintained;humidification applied;pulmonary hygiene promoted;calming measures promoted     Problem: Fall Injury Risk  Goal: Absence of Fall and Fall-Related Injury  Outcome: Resolved

## 2018-01-03 NOTE — Unmapped (Signed)
[facility] Inpatient Service  Daily Progress Note     Interval/Overnight/Subjective:      NAEON. EKG was normal  Still awaiting CF & fungal sputum cultures to be drawn, so will touch base with RT in order to have these induced. Still reports of pain in chest, joints and other large muscles, but has been reduced from before. Yesterday, The sputum for the AFB was induced with RT. Endocrine recommended an increase of his lispo to 10 u qAC    Assessment/Plan:  Principal Problem:    Cystic fibrosis with pulmonary exacerbation (CMS-HCC)  Active Problems:    Essential hypertension    History of DVT (deep vein thrombosis)    Mood disorder (CMS-HCC)    Pancreatic insufficiency due to cystic fibrosis (CMS-HCC)    Mycobacterium abscessus infection  Resolved Problems:    * No resolved hospital problems. *    Christian Young is a 32 y.o. male with PMHx  of CF (genotype S945L and [id number]), DM,  HTN, pancreatic insufficiency, s/p right upper lobe who presented to [facility] with Cystic fibrosis with pulmonary exacerbation (CMS-HCC)    CF exacerbation w/ pulmonary manifestations, acute exacerbation of bronchiectasis: Genotype S945L and [id number], last FEV1 of 67.1 % on 3/14. Culture Hx of Sm PsA (sensitive to Cipro, Tobra, Intermediate to zosyn, levo, cefepime and aztreo), M. Abscessus, S. Maltophilia, and OPF. Hx of R-partial lobectomy August 2018.  At home on daily azithromycin, clofazimime, and inhaled amikacin for hx of M. Abscess. CT showed generalized bronchiectasis, with nodular tree-in-bud opacities in lower lobes. RPP on 5/19 was negative.  - AC: Pulmozyme BID, Albuterol nebs, 7 % HTS QID, Aerobika, Chest Vest   - f/u CF sputum culture, AFB cultures x 3 to rule out NTM, and fungal culture  - Induced sputum again, for additional samples  - IV Tobramycin and PO Cipro (5/19 - )  - home Azithromycin and Clofazimine (history of NTM infection)   - hold inhaled Amikacin in setting of IV Tobra   - Symdeko BID   - Symbicort BID    CF-related Pancreatic Insufficiency.  - Creon (7 - 8 tablets w/ meals, 5 -6 tablets with snacks)  - MVW BID, vitamin D3 1000 U daily  - Hold home pro-biotics     CF GI Disease: Patient reports distant history of requiring Golytely for constipation. Denies current symptoms.   - PRN miralax and senna     History of DVT: UE clots a/w PICC line, hx of unprovoked LE DVTs.   - home Xarelto    CFRD.  - Endocrinology consulted  - Lantus 14U QD  - Lispro to 8U qAC  - Lispro sensitive qACHS    Chronic stable medical conditions:   HTN: home Lisinopril and Coreg  Chronic Pain: Lyrica 300mg  BID, Tramadol 100mg  q6hrs PRN   Chronic Sinusitis: Zyrtec, Singulair  RLS: Home pramipexole  Depression: Buspar, vilazodone, lamotrigine  Insomnia: Melatonin 9mg , Trazodone 100mg      Daily Checklist:   Diet: regular  GI ppx: home PPI  DVT ppx: home Xarelto  Dispo: [facility], floor status. Has talked to the SW, may be getting insurance to cover leaving.  Code Status: Full Code    ___________________________________________________________________    Labs/Studies:  Labs and Studies from the last 24hrs per EMR and Reviewed    Physical Exam:  Vitals:    01/03/18 0924   BP: 129/87   Pulse: 92   Resp:    Temp:    SpO2:  Physical Exam:  GEN: no acute distress  PULM: Normal respiratory effort. decreased breath sounds at right base.  CV: RRR  ABD: soft, NT/ND  EXT:  No clubbing, cyanosis or edema  NEURO: nonfocal, alert, oriented     [name], MS3

## 2018-01-03 NOTE — Unmapped (Signed)
Endocrinology Consult Note    Requesting Attending Physician :  Blair Dolphin, *  Service Requesting Consult : Pulmonology (MDG)  Primary Care Provider: [facility]  Outpatient Endocrinologist: N/A    Assessment/Recommendations:    1. CFRD  -Continue Lantus 14u qd  -Increase Lispro to 10u qAC  -Continue sensitive qACHS  -Please advise to changes to diet or plan for steroids    The patient was seen and discussed with Dr. Marcello Fennel.    Thank you for the consult. We will continue to follow patient as needed. Please call/page [phone] with questions or concerns.     Alfredia Ferguson, MD  [facility] Endocrinology Fellow      History of Present Illness: :    Reason for Consult:   CFRD    Christian Young is a 32 y.o. male admitted for Cystic fibrosis with pulmonary exacerbation (CMS-HCC). I have been asked to evaluate [name] for CFRD. Per last endo note (seen by Dr. Monia Sabal in Feb 2019), he had been on Lantus 5u qd in the past (supervised by [facility] endo), but then went off of insulin for 3 years after moving to [place]. Last a1c 7.6. At his last visit, he was advised to restart Lantus 5u qd with instructions for self titration, Novolog 4u qAC and 2:50>200. He confirms that he's currently taking Lantus 11u qd, Novolog 5u qAC and 2:50>200 with adequate control at home.     Interval history: Doing well today. Noted to have postprandial BG excursions. Discussed to increase insulin doses.     Past Medical History:    Medical History:    Past Medical History:   Diagnosis Date   ??? Anxiety    ??? Chronic pain disorder    ??? Cystic fibrosis (CMS-HCC)    ??? Depression    ??? Hypertension        Surgical History:    Past Surgical History:   Procedure Laterality Date   ??? PR REMOVAL OF LUNG,LOBECTOMY Right 03/29/2017    Procedure: REMOVAL OF LUNG, OTHER THAN PNEUMONECTOMY; SINGLE LOBE (LOBECTOMY);  Surgeon: Cherie Dark, MD;  Location: MAIN OR [facility];  Service: Thoracic       Allergies:    Baruch Goldmann lysine]; Cefepime; Other; Slo-bid 100; Banana; and Tobramycin    All Medications:     Current Facility-Administered Medications   Medication Dose Route Frequency Provider Last Rate Last Dose   ??? albuterol 2.5 mg /3 mL (0.083 %) nebulizer solution 2.5 mg  2.5 mg Nebulization 4x Daily (RT) Blair Dolphin, MD   2.5 mg at 01/03/18 0820   ??? azithromycin (ZITHROMAX) tablet 500 mg  500 mg Oral Daily Latrelle Dodrill, MD   500 mg at 01/03/18 4540   ??? budesonide-formoterol (SYMBICORT) 160-4.5 mcg/actuation inhaler 2 puff  2 puff Inhalation BID Latrelle Dodrill, MD   2 puff at 01/03/18 0820   ??? busPIRone (BUSPAR) tablet 30 mg  30 mg Oral BID Latrelle Dodrill, MD   30 mg at 01/03/18 9811   ??? carvedilol (COREG) tablet 3.125 mg  3.125 mg Oral BID Latrelle Dodrill, MD   3.125 mg at 01/03/18 9147   ??? cetirizine (ZyrTEC) tablet 10 mg  10 mg Oral Daily Latrelle Dodrill, MD   10 mg at 01/03/18 8295   ??? cholecalciferol (vitamin D3) tablet 1,000 Units  1,000 Units Oral Daily Latrelle Dodrill, MD   1,000 Units at 01/03/18 6213   ??? ciprofloxacin HCl (CIPRO) tablet 750 mg  750 mg Oral Q12H  [facility] Malachi Carl, MD   750 mg at 01/03/18 0905   ??? Clofazimine 50 mg capsule  2 capsule Oral Daily with lunch Latrelle Dodrill, MD   2 capsule at 01/03/18 1227   ??? dextrose (D10W) 10% bolus 125 mL  12.5 g Intravenous Q30 Min PRN Blair Dolphin, MD       ??? dornase alfa (PULMOZYME) 1 mg/mL nebulizer solution 2.5 mg  2.5 mg Inhalation BID (RT) Latrelle Dodrill, MD   2.5 mg at 01/03/18 0820   ??? heparin, porcine (PF) 100 unit/mL injection 500 Units  500 Units Intravenous Q8H PRN Blair Dolphin, MD   500 Units at 01/01/18 1942   ??? insulin glargine (LANTUS) injection 14 Units  14 Units Subcutaneous Nightly Edger House, MD   14 Units at 01/02/18 2054   ??? insulin lispro (HumaLOG) injection 0-2.5 Units  0-2.5 Units Subcutaneous Nightly Edger House, MD   2 Units at 01/02/18 2056   ??? insulin lispro (HumaLOG) injection 0-5 Units  0-5 Units Subcutaneous TID AC Edger House, MD   1 Units at 01/03/18 [phone]   ??? insulin lispro (HumaLOG) injection 8 Units  8 Units Subcutaneous TID AC [place]a Jansky, MD   8 Units at 01/03/18 1225   ??? lamoTRIgine (LaMICtal) tablet 100 mg  100 mg Oral Daily Latrelle Dodrill, MD   100 mg at 01/03/18 8295   ??? lisinopril (PRINIVIL,ZESTRIL) tablet 10 mg  10 mg Oral Nightly Latrelle Dodrill, MD   10 mg at 01/02/18 1948   ??? melatonin tablet 6 mg  6 mg Oral Nightly Latrelle Dodrill, MD   6 mg at 01/02/18 2057   ??? montelukast (SINGULAIR) tablet 10 mg  10 mg Oral Nightly Latrelle Dodrill, MD   10 mg at 01/02/18 2057   ??? MVW Complete (pediatric multivit 61-D3-vit K) 1,500-800 unit-mcg 1 capsule  1 capsule Oral BID Latrelle Dodrill, MD   1 capsule at 01/03/18 6213   ??? oxyCODONE (ROXICODONE) immediate release tablet 5 mg  5 mg Oral Q4H PRN Malachi Carl, MD   5 mg at 01/02/18 1222    Or   ??? oxyCODONE (ROXICODONE) immediate release tablet 10 mg  10 mg Oral Q4H PRN Malachi Carl, MD   10 mg at 01/03/18 0865   ??? pancrelipase (Lip-Prot-Amyl) (CREON) 24,000-76,000 -120,000 unit delayed release capsule 192,000-216,000 units of lipase  8-9 capsule Oral With snacks Latrelle Dodrill, MD       ??? pancrelipase (Lip-Prot-Amyl) (CREON) 24,000-76,000 -120,000 unit delayed release capsule 240,000-288,000 units of lipase  10-12 capsule Oral 3xd Meals Latrelle Dodrill, MD   240,000 units of lipase at 01/03/18 1227   ??? pantoprazole (PROTONIX) EC tablet 20 mg  20 mg Oral Daily Latrelle Dodrill, MD   20 mg at 01/03/18 7846   ??? polyethylene glycol (MIRALAX) packet 17 g  17 g Oral Daily PRN Latrelle Dodrill, MD       ??? pramipexole (MIRAPEX) tablet 0.25 mg  0.25 mg Oral Nightly Latrelle Dodrill, MD   0.25 mg at 01/02/18 2057   ??? pregabalin (LYRICA) capsule 300 mg  300 mg Oral BID Latrelle Dodrill, MD   300 mg at 01/03/18 9629   ??? promethazine (PHENERGAN) tablet 12.5 mg  12.5 mg Oral Q6H PRN Edger House, MD   12.5 mg at 01/02/18 1102   ??? rivaroxaban (XARELTO) tablet 20 mg  20 mg Oral Daily Latrelle Dodrill, MD   20 mg at 01/02/18 1836 ??? senna (  SENOKOT) tablet 2 tablet  2 tablet Oral Nightly PRN Latrelle Dodrill, MD       ??? sodium chloride 3 % nebulizer solution 4 mL  4 mL Nebulization Q8H PRN Edger House, MD       ??? sodium chloride 7% nebulizer solution 4 mL  4 mL Nebulization 4x Daily (RT) Latrelle Dodrill, MD   4 mL at 01/03/18 0820   ??? tezacaftor 100mg /ivacaftor 150mg  and ivacaftor 150mg  (SYMDEKO) tablets  1 tablet Oral BID Latrelle Dodrill, MD   1 tablet at 01/03/18 0908   ??? tobramycin (NEBCIN) 250 mg in sodium chloride (NS) 0.9 % 100 mL IVPB  250 mg Intravenous Q12H Edger House, MD 116.3 mL/hr at 01/03/18 0930 250 mg at 01/03/18 0930   ??? traZODone (DESYREL) tablet 100 mg  100 mg Oral Nightly Latrelle Dodrill, MD   100 mg at 01/02/18 2057   ??? vilazodone 40 mg tablet 40 mg  1 tablet Oral Daily Latrelle Dodrill, MD   40 mg at 01/03/18 0909       Social History:     Social History     Socioeconomic History   ??? Marital status: Married     Spouse name: None   ??? Number of children: None   ??? Years of education: None   ??? Highest education level: None   Occupational History   ??? None   Social Needs   ??? Financial resource strain: None   ??? Food insecurity:     Worry: None     Inability: None   ??? Transportation needs:     Medical: None     Non-medical: None   Tobacco Use   ??? Smoking status: Never Smoker   ??? Smokeless tobacco: Never Used   Substance and Sexual Activity   ??? Alcohol use: No   ??? Drug use: No   ??? Sexual activity: Yes     Partners: Female     Birth control/protection: Condom   Lifestyle   ??? Physical activity:     Days per week: None     Minutes per session: None   ??? Stress: None   Relationships   ??? Social connections:     Talks on phone: None     Gets together: None     Attends religious service: None     Active member of club or organization: None     Attends meetings of clubs or organizations: None     Relationship status: None   Other Topics Concern   ??? None   Social History Narrative   ??? None         Family History:    The patient's family history includes Bipolar disorder in his mother; Depression in his mother..    Code Status:    Full Code    Review of Systems:    A 12 system review of systems was negative except as noted in HPI.    Objective: :    Patient Vitals for the past 8 hrs:   BP Pulse Resp SpO2   01/03/18 0924 129/87 92 ??? ???   01/03/18 0820 ??? 90 18 98 %       No intake/output data recorded.    Physical Exam:    General appearance - alert, well appearing, and in no distress  Mental status - alert, oriented to person, place, and time  Eyes -  Sclera anicteric, EOM's intact  Resp - normal WOB. Speaking in full sentences without  difficulty or noted dyspnea.  Neurological - alert, oriented, normal speech, no grossly abnormal focal findings.  Psych - normal affect and behavior  Skin - No rash or lesions, no acanthosis    Test Results    Data Review  Lab Results   Component Value Date    A1C 6.1 (H) 11/19/2016    A1C 6.3 (H) 11/18/2016     Lab Results   Component Value Date    CREATININE 0.97 01/03/2018

## 2018-01-03 NOTE — Unmapped (Signed)
Patient did well with doing all of their treatments today. Patient did their airway clearance without complications.

## 2018-01-04 NOTE — Unmapped (Signed)
Pt A&O X 4, VSS, and all meds passed as ordered. POC No acute events to report this shift. Continue with plan of care.  Problem: Adult Inpatient Plan of Care  Goal: Plan of Care Review  Outcome: Progressing  Goal: Absence of Hospital-Acquired Illness or Injury  Outcome: Progressing  Goal: Optimal Comfort and Wellbeing  Outcome: Progressing     Problem: Diabetes Comorbidity  Goal: Blood Glucose Level Within Desired Range  Outcome: Progressing     Problem: Pain Chronic (Persistent) (Comorbidity Management)  Goal: Acceptable Pain Control and Functional Ability  Outcome: Progressing     Problem: Adjustment to Illness (Cystic Fibrosis)  Goal: Optimal Coping  Outcome: Progressing     Problem: Infection (Cystic Fibrosis)  Goal: Absence of Infection Signs/Symptoms  Outcome: Progressing     Problem: Malabsorption (Cystic Fibrosis)  Goal: Optimal Bowel Elimination  Outcome: Progressing     Problem: Oral Intake Inadequate (Cystic Fibrosis)  Goal: Optimal Nutrition Intake  Outcome: Progressing     Problem: Respiratory Compromise (Cystic Fibrosis)  Goal: Effective Oxygenation and Ventilation  Outcome: Progressing

## 2018-01-04 NOTE — Unmapped (Signed)
Patient AOX4, VSS throughout shift. Pt c/o chest pain relieved by PRN oxycodone. Pt ambulated room last night. All medications given on time as ordered. Insulin lispro coverage needed last night. Pt sleeping in bed. POC maintained. Will continue to monitor.    Problem: Adult Inpatient Plan of Care  Goal: Plan of Care Review  Outcome: Progressing  Goal: Patient-Specific Goal (Individualization)  Outcome: Progressing  Goal: Absence of Hospital-Acquired Illness or Injury  Outcome: Progressing  Goal: Optimal Comfort and Wellbeing  Outcome: Progressing  Goal: Readiness for Transition of Care  Outcome: Progressing  Goal: Rounds/Family Conference  Outcome: Progressing     Problem: Diabetes Comorbidity  Goal: Blood Glucose Level Within Desired Range  Outcome: Progressing     Problem: Pain Chronic (Persistent) (Comorbidity Management)  Goal: Acceptable Pain Control and Functional Ability  Outcome: Progressing     Problem: Adjustment to Illness (Cystic Fibrosis)  Goal: Optimal Coping  Outcome: Progressing     Problem: Infection (Cystic Fibrosis)  Goal: Absence of Infection Signs/Symptoms  Outcome: Progressing     Problem: Malabsorption (Cystic Fibrosis)  Goal: Optimal Bowel Elimination  Outcome: Progressing     Problem: Oral Intake Inadequate (Cystic Fibrosis)  Goal: Optimal Nutrition Intake  Outcome: Progressing     Problem: Respiratory Compromise (Cystic Fibrosis)  Goal: Effective Oxygenation and Ventilation  Outcome: Progressing

## 2018-01-04 NOTE — Unmapped (Signed)
[facility] Inpatient Service  Daily Progress Note     Interval/Overnight/Subjective:      NAEON. Christian Young has no new complaints. Tolerating exercise well, and was able to produce another sputum sample yesterday. Due to insurance issues, he is applying for [facility] and it seems he has used Pharmacy Assistance earlier this year. Therefore, it will be likely that he will remain inpatient to complete ABX course, which he is okay with.     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 *    Christian Young is a 32 y.o. male with PMHx  of CF (genotype S945L and [id number]), DM,  HTN, pancreatic insufficiency, s/p right upper lobe who presented to [facility] on 12/29/17 with a CF exacerbation.     CF exacerbation w/ pulmonary manifestations, acute exacerbation of bronchiectasis: Genotype S945L and [id number], last FEV1 of 67.1 % on 3/14. Culture Hx of Sm PsA (sensitive to Cipro, Tobra, Intermediate to zosyn, levo, cefepime and aztreo), M. Abscessus, S. Maltophilia, and OPF. Hx of R-partial lobectomy August 2018.  At home on daily azithromycin, clofazimime, and inhaled amikacin for hx of M. Abscess. CT showed generalized bronchiectasis, with nodular tree-in-bud opacities in lower lobes. RPP on 5/19 was negative.  - AC: Pulmozyme BID, Albuterol nebs, 7 % HTS QID, Aerobika, Chest Vest   - f/u induced CF sputum results  - IV Tobramycin and PO Cipro (5/19 - )  - home Azithromycin and Clofazimine (history of NTM infection)   - hold inhaled Amikacin in setting of IV Tobra   - Symdeko BID   - Symbicort BID    CF-related Pancreatic Insufficiency.  - Creon (7 - 8 tablets w/ meals, 5 -6 tablets with snacks)  - MVW BID, vitamin D3 1000 U daily  - Hold home pro-biotics     CF GI Disease: Patient reports distant history of requiring Golytely for constipation. Denies current symptoms.   - PRN miralax and senna     History of DVT: UE clots a/w PICC line, hx of unprovoked LE DVTs.   - home Xarelto    CFRD.  - Endocrinology consulted  - Lantus 14U QD  - Lispro 10U qAC   - Lispro sensitive qACHS    Chronic stable medical conditions:   HTN: home Lisinopril and Coreg  Chronic Pain: Lyrica 300mg  BID, Tramadol 100mg  q6hrs PRN   Chronic Sinusitis: Zyrtec, Singulair  RLS: Home pramipexole  Depression: Buspar, vilazodone, lamotrigine  Insomnia: Melatonin 9mg , Trazodone 100mg      Daily Checklist:   Diet: regular  GI ppx: home PPI  DVT ppx: home Xarelto  Dispo: Med [facility], floor status. Has talked to the SW, may be getting insurance to cover leaving.  Code Status: Full Code  ___________________________________________________________________    Labs/Studies:  Labs and Studies from the last 24hrs per EMR and Reviewed    Physical Exam:  Vitals:    01/04/18 0626   BP: 119/69   Pulse: 79   Resp: 16   Temp: 35.8 ??C   SpO2: 94%     Physical Exam:  GEN: pleasant Caucasian gentleman in no acute distress  PULM: normal respiratory effort, decreased breath sounds at right  base.  CV: RRR  ABD: soft, NT/ND  NEURO: nonfocal, alert, oriented

## 2018-01-05 LAB — BASIC METABOLIC PANEL
BLOOD UREA NITROGEN: 17 mg/dL (ref 7–21)
BUN / CREAT RATIO: 21
CALCIUM: 10 mg/dL (ref 8.5–10.2)
CHLORIDE: 102 mmol/L (ref 98–107)
CO2: 25 mmol/L (ref 22.0–30.0)
CREATININE: 0.82 mg/dL (ref 0.70–1.30)
EGFR MDRD NON AF AMER: >=60 mL/min/{1.73_m2} (ref >=60)
GLUCOSE RANDOM: 172 mg/dL (ref 65–179)
POTASSIUM: 4.8 mmol/L (ref 3.5–5.0)
SODIUM: 137 mmol/L (ref 135–145)

## 2018-01-05 LAB — CBC
HEMATOCRIT: 39.3 % — ABNORMAL LOW (ref 41.0–53.0)
HEMOGLOBIN: 12.9 g/dL — ABNORMAL LOW (ref 13.5–17.5)
MEAN CORPUSCULAR HEMOGLOBIN CONC: 32.8 g/dL (ref 31.0–37.0)
MEAN CORPUSCULAR HEMOGLOBIN: 25.5 pg — ABNORMAL LOW (ref 26.0–34.0)
MEAN CORPUSCULAR VOLUME: 77.7 fL — ABNORMAL LOW (ref 80.0–100.0)
PLATELET COUNT: 340 10*9/L (ref 150–440)
RED BLOOD CELL COUNT: 5.06 10*12/L (ref 4.50–5.90)
RED CELL DISTRIBUTION WIDTH: 16.6 % — ABNORMAL HIGH (ref 12.0–15.0)
WBC ADJUSTED: 6.4 10*9/L (ref 4.5–11.0)

## 2018-01-05 LAB — MAGNESIUM
MAGNESIUM: 1.6 mg/dL (ref 1.6–2.2)
Magnesium:MCnc:Pt:Ser/Plas:Qn:: 1.6

## 2018-01-05 LAB — TOBRAMYCIN RANDOM
Tobramycin:MCnc:Pt:Ser/Plas:Qn:: 0.9
Tobramycin:MCnc:Pt:Ser/Plas:Qn:: 3.4

## 2018-01-05 LAB — HEMATOCRIT: Lab: 39.3 — ABNORMAL LOW

## 2018-01-05 LAB — SODIUM: Sodium:SCnc:Pt:Ser/Plas:Qn:: 137

## 2018-01-05 NOTE — Unmapped (Signed)
Patient out of bed in chair for majority of day. Patient states he has not yet used his stationary bike, put plans to soon. Patient reports his cough is not productive. Sputum cup placed at bedside for specimen. RN assisted patient to have his laundry washed. Patient consuming 75-100% of meals. Phenergan given once for nausea, with patient reporting good relief.       Problem: Adult Inpatient Plan of Care  Goal: Plan of Care Review  Outcome: Progressing  Goal: Patient-Specific Goal (Individualization)  Outcome: Progressing  Flowsheets (Taken 01/05/2018 1521)  Patient-Specific Goals (Include Timeframe): Patient will use exercise bike or ambulate in hallway for at least 20 minutes daily by 5/28.  Goal: Absence of Hospital-Acquired Illness or Injury  Outcome: Progressing  Goal: Optimal Comfort and Wellbeing  Outcome: Progressing  Goal: Readiness for Transition of Care  Outcome: Progressing  Goal: Rounds/Family Conference  Outcome: Progressing     Problem: Diabetes Comorbidity  Goal: Blood Glucose Level Within Desired Range  Outcome: Progressing     Problem: Pain Chronic (Persistent) (Comorbidity Management)  Goal: Acceptable Pain Control and Functional Ability  Outcome: Progressing     Problem: Adjustment to Illness (Cystic Fibrosis)  Goal: Optimal Coping  Outcome: Progressing     Problem: Infection (Cystic Fibrosis)  Goal: Absence of Infection Signs/Symptoms  Outcome: Progressing     Problem: Malabsorption (Cystic Fibrosis)  Goal: Optimal Bowel Elimination  Outcome: Progressing     Problem: Oral Intake Inadequate (Cystic Fibrosis)  Goal: Optimal Nutrition Intake  Outcome: Progressing     Problem: Respiratory Compromise (Cystic Fibrosis)  Goal: Effective Oxygenation and Ventilation  Outcome: Progressing

## 2018-01-05 NOTE — Unmapped (Signed)
Patient was compliant with his inhaled respiratory medications and airway clearance this shift. Will continue to monitor.

## 2018-01-05 NOTE — Unmapped (Signed)
Pt A&o X 4, VSS, all meds passed as ordered. POC maintained. No acute events to report this shift. Continue with plan of care.  Problem: Adult Inpatient Plan of Care  Goal: Plan of Care Review  01/04/2018 2036 by Creed Copper, RN  Outcome: Progressing  01/04/2018 1930 by Creed Copper, RN  Outcome: Progressing  Goal: Absence of Hospital-Acquired Illness or Injury  01/04/2018 2036 by Creed Copper, RN  Outcome: Progressing  01/04/2018 1930 by Creed Copper, RN  Outcome: Progressing  Goal: Optimal Comfort and Wellbeing  01/04/2018 2036 by Creed Copper, RN  Outcome: Progressing  01/04/2018 1930 by Creed Copper, RN  Outcome: Progressing     Problem: Diabetes Comorbidity  Goal: Blood Glucose Level Within Desired Range  01/04/2018 2036 by Creed Copper, RN  Outcome: Progressing  01/04/2018 1930 by Creed Copper, RN  Outcome: Progressing     Problem: Pain Chronic (Persistent) (Comorbidity Management)  Goal: Acceptable Pain Control and Functional Ability  01/04/2018 2036 by Creed Copper, RN  Outcome: Progressing  01/04/2018 1930 by Creed Copper, RN  Outcome: Progressing     Problem: Adjustment to Illness (Cystic Fibrosis)  Goal: Optimal Coping  01/04/2018 2036 by Creed Copper, RN  Outcome: Progressing  01/04/2018 1930 by Creed Copper, RN  Outcome: Progressing     Problem: Infection (Cystic Fibrosis)  Goal: Absence of Infection Signs/Symptoms  01/04/2018 2036 by Creed Copper, RN  Outcome: Progressing  01/04/2018 1930 by Creed Copper, RN  Outcome: Progressing     Problem: Malabsorption (Cystic Fibrosis)  Goal: Optimal Bowel Elimination  01/04/2018 2036 by Creed Copper, RN  Outcome: Progressing  01/04/2018 1930 by Creed Copper, RN  Outcome: Progressing     Problem: Oral Intake Inadequate (Cystic Fibrosis)  Goal: Optimal Nutrition Intake  01/04/2018 2036 by Creed Copper, RN  Outcome: Progressing  01/04/2018 1930 by Creed Copper, RN  Outcome: Progressing     Problem: Respiratory Compromise (Cystic Fibrosis)  Goal: Effective Oxygenation and Ventilation  01/04/2018 2036 by Creed Copper, RN  Outcome: Progressing  01/04/2018 1930 by Creed Copper, RN  Outcome: Progressing

## 2018-01-05 NOTE — Unmapped (Signed)
Patient AOX4, VSS throughout shift. Pt denied pain and SOB. Pt ambulated room last night. All medications given on time as ordered. Insulin lispro coverage not needed last night. Pt sleeping in bed. POC maintained. Will continue to monitor.    Problem: Adult Inpatient Plan of Care  Goal: Plan of Care Review  Outcome: Progressing  Goal: Patient-Specific Goal (Individualization)  Outcome: Progressing  Goal: Absence of Hospital-Acquired Illness or Injury  Outcome: Progressing  Goal: Optimal Comfort and Wellbeing  Outcome: Progressing  Goal: Readiness for Transition of Care  Outcome: Progressing  Goal: Rounds/Family Conference  Outcome: Progressing     Problem: Diabetes Comorbidity  Goal: Blood Glucose Level Within Desired Range  Outcome: Progressing     Problem: Pain Chronic (Persistent) (Comorbidity Management)  Goal: Acceptable Pain Control and Functional Ability  Outcome: Progressing     Problem: Adjustment to Illness (Cystic Fibrosis)  Goal: Optimal Coping  Outcome: Progressing     Problem: Infection (Cystic Fibrosis)  Goal: Absence of Infection Signs/Symptoms  Outcome: Progressing     Problem: Malabsorption (Cystic Fibrosis)  Goal: Optimal Bowel Elimination  Outcome: Progressing     Problem: Oral Intake Inadequate (Cystic Fibrosis)  Goal: Optimal Nutrition Intake  Outcome: Progressing     Problem: Respiratory Compromise (Cystic Fibrosis)  Goal: Effective Oxygenation and Ventilation  Outcome: Progressing

## 2018-01-05 NOTE — Unmapped (Signed)
[facility] Inpatient Service  Daily Progress Note     Interval/Overnight/Subjective:      NAEON. Christian Young has no new complaints. Tolerating exercise well, and was able to produce another sputum sample on Friday, but we will try to call [facility] to see if it was in fact processed. If unable to, we have asked if he would be willing to produce another sample.     There have been conflicting reports re: the status of his insurance - he states he lost his insurance when his wife changed jobs (which is corroborated by SW telephone encounter in April 2019) and he is currently applying for [facility]. However, case mgmt apparently ran his insurance as being valid last week. Until this is confirmed, he may need to remain inpatient to complete ABX course, which he is okay with. If his insurance is still valid, he may be able to discharge on Tuesday.     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 *    [name] is a 32 y.o. male with PMHx  of CF (genotype S945L and [id number]), DM,  HTN, pancreatic insufficiency, s/p right upper lobe who presented to [facility] on 12/29/17 with a CF exacerbation.     CF exacerbation w/ pulmonary manifestations, acute exacerbation of bronchiectasis: Genotype S945L and [id number], last FEV1 of 67.1 % on 3/14. Culture Hx of Sm PsA (sensitive to Cipro, Tobra, Intermediate to zosyn, levo, cefepime and aztreo), M. Abscessus, S. Maltophilia, and OPF. Hx of R-partial lobectomy August 2018.  At home on daily azithromycin, clofazimime, and inhaled amikacin for hx of M. Abscess. CT showed generalized bronchiectasis, with nodular tree-in-bud opacities in lower lobes. RPP on 5/19 was negative.  - AC: Pulmozyme BID, Albuterol nebs, 7 % HTS QID, Aerobika, Chest Vest   - f/u induced CF sputum results  - IV Tobramycin and PO Cipro (5/19 - )  - home Azithromycin and Clofazimine (history of NTM infection)   - hold inhaled Amikacin in setting of IV Tobra   - Symdeko BID   - Symbicort BID    CF-related Pancreatic Insufficiency.  - Creon (7-8 tablets w/ meals, 5-6 tablets with snacks)  - MVW BID, vitamin D3 1000 U daily  - Hold home pro-biotics     CF GI Disease: Patient reports distant history of requiring Golytely for constipation. Denies current symptoms.   - PRN miralax and senna     History of DVT: UE clots a/w PICC line, hx of unprovoked LE DVTs.   - home Xarelto    CFRD.  - Endocrinology consulted  - Lantus 14U QD  - Lispro 10U qAC   - Lispro sensitive qACHS    Chronic stable medical conditions:   HTN: home Lisinopril and Coreg  Chronic Pain: Lyrica 300mg  BID, Tramadol 100mg  q6hrs PRN   Chronic Sinusitis: Zyrtec, Singulair  RLS: Home pramipexole  Depression: Buspar, vilazodone, lamotrigine  Insomnia: Melatonin 9mg , Trazodone 100mg      Daily Checklist:   Diet: regular  GI ppx: home PPI  DVT ppx: home Xarelto  Dispo: [facility], floor status. Has talked to the SW, may be getting insurance to cover leaving.  Code  Status: Full Code  ___________________________________________________________________    Labs/Studies:  Labs and Studies from the last 24hrs per EMR and Reviewed    Physical Exam:  Vitals:    01/05/18 0529   BP: 134/73   Pulse: 94   Resp: 16   Temp: 36.6 ??C   SpO2: 95%     Physical Exam:  GEN: pleasant Caucasian gentleman in no acute distress  PULM: normal respiratory effort, no increased WOB  CV: RRR  ABD: soft, NT/ND  NEURO: nonfocal, alert, oriented

## 2018-01-06 LAB — CO2: Carbon dioxide:SCnc:Pt:Ser/Plas:Qn:: 27

## 2018-01-06 LAB — CBC
HEMATOCRIT: 39 % — ABNORMAL LOW (ref 41.0–53.0)
HEMOGLOBIN: 12.4 g/dL — ABNORMAL LOW (ref 13.5–17.5)
MEAN CORPUSCULAR HEMOGLOBIN CONC: 31.7 g/dL (ref 31.0–37.0)
MEAN CORPUSCULAR HEMOGLOBIN: 25.3 pg — ABNORMAL LOW (ref 26.0–34.0)
MEAN CORPUSCULAR VOLUME: 79.8 fL — ABNORMAL LOW (ref 80.0–100.0)
MEAN PLATELET VOLUME: 8.8 fL (ref 7.0–10.0)
PLATELET COUNT: 278 10*9/L (ref 150–440)
RED CELL DISTRIBUTION WIDTH: 16.1 % — ABNORMAL HIGH (ref 12.0–15.0)
WBC ADJUSTED: 8.2 10*9/L (ref 4.5–11.0)

## 2018-01-06 LAB — BASIC METABOLIC PANEL
ANION GAP: 10 mmol/L (ref 9–15)
BLOOD UREA NITROGEN: 18 mg/dL (ref 7–21)
BUN / CREAT RATIO: 19
CALCIUM: 9.1 mg/dL (ref 8.5–10.2)
CHLORIDE: 98 mmol/L (ref 98–107)
CO2: 27 mmol/L (ref 22.0–30.0)
EGFR MDRD AF AMER: >=60 mL/min/{1.73_m2} (ref >=60)
EGFR MDRD NON AF AMER: >=60 mL/min/{1.73_m2} (ref >=60)
GLUCOSE RANDOM: 203 mg/dL — ABNORMAL HIGH (ref 65–179)
POTASSIUM: 4.1 mmol/L (ref 3.5–5.0)

## 2018-01-06 LAB — TOBRAMYCIN RANDOM: Tobramycin:MCnc:Pt:Ser/Plas:Qn:: 1.7

## 2018-01-06 LAB — MAGNESIUM: Magnesium:MCnc:Pt:Ser/Plas:Qn:: 1.8

## 2018-01-06 LAB — HEMATOCRIT: Lab: 39 — ABNORMAL LOW

## 2018-01-06 NOTE — Unmapped (Signed)
Patient AOX4, VSS throughout shift. Pt denied pain and SOB. Pt ambulated room last night. All medications given on time as ordered. Insulin lispro coverage given last night. Pt sleeping in bed. POC maintained. Will continue to monitor.     Problem: Adult Inpatient Plan of Care  Goal: Plan of Care Review  Outcome: Progressing  Goal: Patient-Specific Goal (Individualization)  Outcome: Progressing  Goal: Absence of Hospital-Acquired Illness or Injury  Outcome: Progressing  Goal: Optimal Comfort and Wellbeing  Outcome: Progressing  Goal: Readiness for Transition of Care  Outcome: Progressing  Goal: Rounds/Family Conference  Outcome: Progressing     Problem: Diabetes Comorbidity  Goal: Blood Glucose Level Within Desired Range  Outcome: Progressing     Problem: Pain Chronic (Persistent) (Comorbidity Management)  Goal: Acceptable Pain Control and Functional Ability  Outcome: Progressing     Problem: Adjustment to Illness (Cystic Fibrosis)  Goal: Optimal Coping  Outcome: Progressing     Problem: Infection (Cystic Fibrosis)  Goal: Absence of Infection Signs/Symptoms  Outcome: Progressing     Problem: Malabsorption (Cystic Fibrosis)  Goal: Optimal Bowel Elimination  Outcome: Progressing     Problem: Oral Intake Inadequate (Cystic Fibrosis)  Goal: Optimal Nutrition Intake  Outcome: Progressing     Problem: Respiratory Compromise (Cystic Fibrosis)  Goal: Effective Oxygenation and Ventilation  Outcome: Progressing

## 2018-01-06 NOTE — Unmapped (Signed)
[facility] Inpatient Service  Daily Progress Note     Interval/Overnight/Subjective:      NAEON. No new complaints, but intermittent nausea usually relieved with PO Phenergan. Needed Zofran x1 this AM due to persistent nausea however. Repeat CF Sputum Cx collected and sent yesterday, but unlikely to change mgmt plan. Will discuss insurance with case mgmt tomorrow.     Will look into whether he received Liposomal or parenteral (IV) Amikacin.     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 *    Christian Young is a 32 y.o. male with PMHx  of CF (genotype S945L and [id number]), DM,  HTN, pancreatic insufficiency, s/p right upper lobe who presented to [facility] on 12/29/17 with a CF exacerbation.     CF exacerbation w/ pulmonary manifestations, acute exacerbation of bronchiectasis: Genotype S945L and [id number], last FEV1 of 67.1 % on 3/14. Culture Hx of Sm PsA (sensitive to Cipro, Tobra, Intermediate to zosyn, levo, cefepime and aztreo), M. Abscessus, S. Maltophilia, and OPF. Hx of R-partial lobectomy August 2018.  At home on daily azithromycin, clofazimime, and inhaled amikacin for hx of M. Abscess. CT showed generalized bronchiectasis, with nodular tree-in-bud opacities in lower lobes. RPP on 5/19 was negative.  - AC: Pulmozyme BID, Albuterol nebs, 7 % HTS QID, Aerobika, Chest Vest   - f/u induced CF sputum results  - IV Tobramycin and PO Cipro (5/19 - )  - home Azithromycin and Clofazimine (history of NTM infection)   - hold inhaled Amikacin in setting of IV Tobra   - Symdeko BID   - Symbicort BID    CF-related Pancreatic Insufficiency.  - Creon (7-8 tablets w/ meals, 5-6 tablets with snacks)  - MVW BID, vitamin D3 1000 U daily  - Hold home pro-biotics     CF GI Disease: Patient reports distant history of requiring Golytely for constipation. Denies current symptoms.   - PRN miralax and senna     History of DVT: UE clots a/w PICC line, hx of unprovoked LE DVTs.   - home Xarelto    CFRD.  - Endocrinology consulted  - Lantus 14U QD  - Lispro 10U qAC   - Lispro 4U w/ snacks PRN  - Lispro sensitive qACHS    Chronic stable medical conditions:   HTN: home Lisinopril and Coreg  Chronic Pain: Lyrica 300mg  BID, Tramadol 100mg  q6hrs PRN   Chronic Sinusitis: Zyrtec, Singulair  RLS: Home pramipexole  Depression: Buspar, vilazodone, lamotrigine  Insomnia: Melatonin 9mg , Trazodone 100mg      Daily Checklist:   Diet: regular  GI ppx: home PPI  DVT ppx: home Xarelto  Dispo: [facility], floor status  Code Status: Full Code  ___________________________________________________________________    Labs/Studies:  Labs and Studies from the last 24hrs per EMR and Reviewed    Physical Exam:  Vitals:    01/06/18 0550   BP: 114/58   Pulse: 92   Resp: 18   Temp: 36.4 ??C   SpO2: 91%     Physical Exam:  GEN: pleasant Caucasian gentleman in no acute distress  PULM: normal respiratory effort, no increased WOB  CV: RRR  ABD: soft, NT/ND  NEURO: nonfocal, alert,  oriented

## 2018-01-06 NOTE — Unmapped (Signed)
Patient able to produce sputum sample yesterday after vigorous exercise on stationary bike. Patient ambulated for 20 minutes in hallway this morning. Patient with nausea this morning, which he attributes to the IV Tobramycin. Nausea resolved after PO phenergan and zofran. Patient reports cough is nonproductive today.     Problem: Adult Inpatient Plan of Care  Goal: Plan of Care Review  Outcome: Progressing  Goal: Patient-Specific Goal (Individualization)  Outcome: Progressing  Flowsheets (Taken 01/05/2018 1521)  Patient-Specific Goals (Include Timeframe): Patient will use exercise bike or ambulate in hallway for at least 20 minutes daily by 5/28.  Goal: Absence of Hospital-Acquired Illness or Injury  Outcome: Progressing  Goal: Optimal Comfort and Wellbeing  Outcome: Progressing  Goal: Readiness for Transition of Care  Outcome: Progressing  Goal: Rounds/Family Conference  Outcome: Progressing     Problem: Diabetes Comorbidity  Goal: Blood Glucose Level Within Desired Range  Outcome: Progressing     Problem: Pain Chronic (Persistent) (Comorbidity Management)  Goal: Acceptable Pain Control and Functional Ability  Outcome: Progressing     Problem: Adjustment to Illness (Cystic Fibrosis)  Goal: Optimal Coping  Outcome: Progressing     Problem: Infection (Cystic Fibrosis)  Goal: Absence of Infection Signs/Symptoms  Outcome: Progressing     Problem: Malabsorption (Cystic Fibrosis)  Goal: Optimal Bowel Elimination  Outcome: Progressing     Problem: Oral Intake Inadequate (Cystic Fibrosis)  Goal: Optimal Nutrition Intake  Outcome: Progressing     Problem: Respiratory Compromise (Cystic Fibrosis)  Goal: Effective Oxygenation and Ventilation  Outcome: Progressing

## 2018-01-07 LAB — TOBRAMYCIN RANDOM
Tobramycin:MCnc:Pt:Ser/Plas:Qn:: 1.6
Tobramycin:MCnc:Pt:Ser/Plas:Qn:: 11.5
Tobramycin:MCnc:Pt:Ser/Plas:Qn:: <0.6

## 2018-01-07 LAB — BASIC METABOLIC PANEL
ANION GAP: 11 mmol/L (ref 9–15)
BLOOD UREA NITROGEN: 17 mg/dL (ref 7–21)
BUN / CREAT RATIO: 18
CO2: 27 mmol/L (ref 22.0–30.0)
CREATININE: 0.96 mg/dL (ref 0.70–1.30)
EGFR MDRD AF AMER: >=60 mL/min/{1.73_m2} (ref >=60)
EGFR MDRD NON AF AMER: >=60 mL/min/{1.73_m2} (ref >=60)
GLUCOSE RANDOM: 180 mg/dL — ABNORMAL HIGH (ref 65–179)
POTASSIUM: 4.2 mmol/L (ref 3.5–5.0)
SODIUM: 136 mmol/L (ref 135–145)

## 2018-01-07 LAB — CBC
HEMATOCRIT: 39.5 % — ABNORMAL LOW (ref 41.0–53.0)
HEMOGLOBIN: 12.6 g/dL — ABNORMAL LOW (ref 13.5–17.5)
MEAN CORPUSCULAR HEMOGLOBIN CONC: 32 g/dL (ref 31.0–37.0)
MEAN CORPUSCULAR HEMOGLOBIN: 25.5 pg — ABNORMAL LOW (ref 26.0–34.0)
MEAN PLATELET VOLUME: 8.8 fL (ref 7.0–10.0)
PLATELET COUNT: 277 10*9/L (ref 150–440)
RED BLOOD CELL COUNT: 4.96 10*12/L (ref 4.50–5.90)
RED CELL DISTRIBUTION WIDTH: 16 % — ABNORMAL HIGH (ref 12.0–15.0)
WBC ADJUSTED: 7.2 10*9/L (ref 4.5–11.0)

## 2018-01-07 LAB — HEMOGLOBIN A1C: Hemoglobin A1c/Hemoglobin.total:MFr:Pt:Bld:Qn:: 9 — ABNORMAL HIGH

## 2018-01-07 LAB — MAGNESIUM: Magnesium:MCnc:Pt:Ser/Plas:Qn:: 1.9

## 2018-01-07 LAB — TOBRAMYCIN LEVEL, RANDOM: TOBRAMYCIN RANDOM: <0.6 ug/mL

## 2018-01-07 LAB — EGFR MDRD NON AF AMER: Glomerular filtration rate/1.73 sq M.predicted.non black:ArVRat:Pt:Ser/Plas/Bld:Qn:Creatinine-based formula (MDRD): >=60

## 2018-01-07 LAB — MEAN CORPUSCULAR HEMOGLOBIN: Lab: 25.5 — ABNORMAL LOW

## 2018-01-07 NOTE — Unmapped (Signed)
Pt VSS, pt free from falls. No complaints of pain. All meds administered w/o difficulty. Pt afebrile this shift. Pt remains on contact precautions. Pt receiving bolus at this time for increased tobra and creatinine levels. No other significant events to report at this time. Will continue to monitor.     Problem: Adult Inpatient Plan of Care  Goal: Plan of Care Review  Outcome: Progressing  Goal: Patient-Specific Goal (Individualization)  Outcome: Progressing  Goal: Absence of Hospital-Acquired Illness or Injury  Outcome: Progressing  Goal: Optimal Comfort and Wellbeing  Outcome: Progressing  Goal: Readiness for Transition of Care  Outcome: Progressing  Goal: Rounds/Family Conference  Outcome: Progressing     Problem: Diabetes Comorbidity  Goal: Blood Glucose Level Within Desired Range  Outcome: Progressing     Problem: Pain Chronic (Persistent) (Comorbidity Management)  Goal: Acceptable Pain Control and Functional Ability  Outcome: Progressing     Problem: Adjustment to Illness (Cystic Fibrosis)  Goal: Optimal Coping  Outcome: Progressing     Problem: Infection (Cystic Fibrosis)  Goal: Absence of Infection Signs/Symptoms  Outcome: Progressing     Problem: Malabsorption (Cystic Fibrosis)  Goal: Optimal Bowel Elimination  Outcome: Progressing     Problem: Oral Intake Inadequate (Cystic Fibrosis)  Goal: Optimal Nutrition Intake  Outcome: Progressing     Problem: Respiratory Compromise (Cystic Fibrosis)  Goal: Effective Oxygenation and Ventilation  Outcome: Progressing

## 2018-01-07 NOTE — Unmapped (Signed)
Physician Discharge Summary [facility]  6 [address] [facility]  [address]  [place] [address]  Dept: [phone]  Loc: [phone]     Identifying Information:   Phu Record  [birth year]  [id number]    Primary Care Physician: [facility]   Code Status: Full Code    Admit Date: 12/29/2017    Discharge Date: 01/08/2018     Discharge To: Home    Discharge Service: MDG - Pulmonary Admit     Discharge Attending Physician: Truett Mainland, MD    Discharge Diagnoses:  Principal Problem:    Cystic fibrosis with pulmonary exacerbation (CMS-HCC)  Active Problems:    Essential hypertension    History of DVT (deep vein thrombosis)    Mood disorder (CMS-HCC)    Pancreatic insufficiency due to cystic fibrosis (CMS-HCC)    Mycobacterium abscessus infection  Resolved Problems:    * No resolved hospital problems. *    Outpatient Provider Follow Up Issues:   [ ]  f/u induced CF sputum results  [ ]  f/u insurance status and ensure completion of [employer] paperwork   [ ]  referral placed for follow up appointments with Dr. Koren Shiver in Pulmonary clinic and with Endocrinology (given elevated HgbA1c)    Hospital Course:   Mr. Tamar Lipscomb is a 32 y.o. male with PMHx  of CF (genotype S945L and [id number]), DM,  HTN, pancreatic insufficiency, s/p right upper lobe who presented to [facility] on 12/29/17 with a CF exacerbation.   ??  CF exacerbation w/ pulmonary manifestations, acute exacerbation of bronchiectasis: Genotype S945L and [id number], last FEV1 of 67.1 % on 3/14. Culture Hx of Sm PsA (sensitive to Cipro, Tobra, Intermediate to zosyn, levo, cefepime and aztreo), M. Abscessus, S. Maltophilia, and OPF. Hx of R-partial lobectomy August 2018.  At home on daily azithromycin, clofazimime, and inhaled amikacin for hx of M. Abscess. CT showed generalized bronchiectasis, with nodular tree-in-bud opacities in lower lobes. RPP on 5/19 was negative. He was placed on IV Tobramycin (5/19-5/29) and PO Cipro (5/19 - ), plan to complete course with PO Cipro and Arikayce (end-date on 6/1). He was continued on home Azithromycin and Clofazimine (history of NTM infection). Symdeko BID continued.   AC regimen:   AEROBIKA  QID: Albuterol (9)  QID: 7%  BID: Symbicort  BID: Pulmozyme     CFRD. HbA1c of 9.0%. Endocrinology consulted. Regimen: Lantus 14U QD, Lispro 12U qAC , Lispro 4U w/ snacks PRN, and Lispro sensitive qACHS. Referral to see Endocrinology clinic placed on discharge.   ??  CF-related Pancreatic Insufficiency. Creon (7-8 tablets w/ meals, 5-6 tablets with snacks), and MVW BID, vitamin D3 1000 U daily. Home pro-biotics were held while inpatient.   ??  CF GI Disease: Patient reports distant history of requiring Golytely for constipation. Denies current symptoms. Given PRN miralax and senna.  ??  History of DVT: UE clots a/w PICC line, hx of unprovoked LE DVTs. Continued on home Xarelto.  ??  Chronic stable medical conditions:   HTN: home Lisinopril and Coreg  Chronic Pain: Lyrica 300mg  BID, Tramadol 100mg  q6hrs PRN   Chronic Sinusitis: Zyrtec, Singulair  RLS: Home pramipexole  Depression: Buspar, vilazodone, lamotrigine  Insomnia: Melatonin 9mg , Trazodone 100mg    OSA: didn't bring home BiPAP    Procedures:  None  ______________________________________________________________________  Discharge Medications:     Your Medication List      START taking these medications    ciprofloxacin HCl 750 MG tablet  Commonly known as:  CIPRO  Take 1 tablet (750 mg total) by mouth every twelve (12) hours. for 3 days        CONTINUE taking these medications    albuterol 90 mcg/actuation inhaler  Commonly known as:  PROVENTIL HFA;VENTOLIN HFA  Inhale 2 puffs every six (6) hours as needed.     albuterol 2.5 mg/0.5 mL nebulizer solution  Inhale 0.5 mL (2.5 mg total) by nebulization every six (6) hours as needed for wheezing.     amikacin liposomal-neb.accessr 590 mg/8.4 mL Nbsp  Commonly known as:  ARIKAYCE  Inhale 590 mg daily. azithromycin 500 MG tablet  Commonly known as:  ZITHROMAX  Take 500 mg by mouth 3 (three) times a week. 3 times weekly on MWTh     blood sugar diagnostic Strp  Dispense 150 blood glucose test strips, ok to sub any brand preferred by insurance/patient, use up to 5x/day, dx E 08.9     blood-glucose meter Misc  Dispense meter that is preferred by patient's insurance company     budesonide-formoterol 160-4.5 mcg/actuation inhaler  Commonly known as:  SYMBICORT  Inhale 2 puffs Two (2) times a day.     busPIRone 30 MG tablet  Commonly known as:  BUSPAR  Take 30 mg by mouth Two (2) times a day.     carvedilol 3.125 MG tablet  Commonly known as:  COREG  Take 1 tablet (3.125 mg total) by mouth Two (2) times a day.     cetirizine 10 MG tablet  Commonly known as:  ZyrTEC  Take 10 mg by mouth daily.     cholecalciferol (vitamin D3) 1,000 unit tablet  Take 1 tablet (1,000 Units total) by mouth daily.     CLOFAZIMINE ORAL  Take 200 mg by mouth daily.     dornase alfa 1 mg/mL nebulizer solution  Commonly known as:  PULMOZYME  Inhale 2.5 mg Two (2) times a day.     fluticasone propionate 50 mcg/actuation nasal spray  Commonly known as:  FLONASE  2 sprays by Each Nare route daily.     insulin ASPART 100 unit/mL (3 mL) injection pen  Commonly known as:  NovoLOG FLEXPEN  Use up to 30 units/day, divided TID AC meals as per MD instructions     insulin detemir U-100 100 unit/mL (3 mL) injection pen  Commonly known as:  LEVEMIR FLEXTOUCH U-100 INSULN  Use up to 30 units per day, as per MD instructions     lamoTRIgine 100 MG tablet  Commonly known as:  LaMICtal  Take 100 mg by mouth daily.     lancets Misc  Dispense 150 lancets, ok to sub any brand preferred by insurance/patient, use up to 5x/day, dx E08.9     lipase-protease-amylase 36,000-114,000- 180,000 unit Cpdr  Commonly known as:  CREON  Take 7-8 capsules with meals and 5-6 capsules with snacks     lisinopril 10 MG tablet  Commonly known as:  PRINIVIL,ZESTRIL  TAKE 1 TABLET BY MOUTH EVERY DAY     melatonin 3 mg Tab  Take 2 tablets (6 mg total) by mouth nightly.     montelukast 10 mg tablet  Commonly known as:  SINGULAIR  Take 1 tablet (10 mg total) by mouth nightly.     MVW Complete (pediatric multivit 61-D3-vit K) 1,500-800 unit-mcg Cap  Take 1 capsule by mouth Two (2) times a day.     MVW COMPLETE FORMUL PROBIOTIC 40 billion cell -15 mg Cpdr  Generic drug:  Lacto-Bif-Sac-Bacil-Strep-bact  Take  1 capsule by mouth daily.     omeprazole 40 MG capsule  Commonly known as:  PriLOSEC  Take 1 capsule (40 mg total) by mouth daily.     pen needle, diabetic 32 gauge x 5/32 Ndle  Commonly known as:  BD ULTRA-FINE NANO PEN NEEDLE  ok to sub any brand or size needle preferred by insurance/patient, use up to 4x/day, dx E 08.9     pramipexole 0.125 MG tablet  Commonly known as:  MIRAPEX  Take 0.25 mg by mouth daily.     pregabalin 300 MG capsule  Commonly known as:  LYRICA  Take 1 capsule (300 mg total) by mouth Two (2) times a day.     rivaroxaban 20 mg tablet  Commonly known as:  XARELTO  Take 1 tablet (20 mg total) by mouth daily with evening meal.     sodium bicarb-sodium chloride 700-2,300 mg Pack  Irrigate each nostril with 120 mL of solution (1 packet mixed with 240 mL of distilled water) BID     sodium chloride 7% 7 % Nebu  Inhale 4 mL by nebulization Two (2) times a day.     tezacaftor-ivacaftor tablet  Commonly known as:  SYMDEKO  Take 1 tablet (tezacaftor 100mg /ivacaftor 150mg ) by mouth every AM and 1 tablet (ivacaftor 150mg ) every PM with fatty food     traMADol 50 mg tablet  Commonly known as:  ULTRAM  Take 100 mg by mouth every six (6) hours as needed for pain.     traZODone 100 MG tablet  Commonly known as:  DESYREL  Take 100 mg by mouth nightly.     VIIBRYD ORAL  Take 40 mg by mouth daily.            Allergies:  Cayston [aztreonam lysine]; Cefepime; Other; Slo-bid 100; Banana; and Tobramycin  ______________________________________________________________________  Pending Test Results (if blank, then none):   Order Current Status    AFB culture In process    CF Sputum/ CF Sinus Culture Preliminary result          Most Recent Labs:  All lab results last 24 hours -   Recent Results (from the past 24 hour(s))   POCT Glucose    Collection Time: 01/07/18 11:41 AM   Result Value Ref Range    Glucose, POC 192 (H) 65 - 179 mg/dL   Tobramycin Level, Random    Collection Time: 01/07/18 11:56 AM   Result Value Ref Range    Tobramycin Rm 1.6 Undefined ug/mL   POCT Glucose    Collection Time: 01/07/18  4:46 PM   Result Value Ref Range    Glucose, POC 137 65 - 179 mg/dL   Tobramycin Level, Random    Collection Time: 01/07/18  8:08 PM   Result Value Ref Range    Tobramycin Rm <0.6 Undefined ug/mL   POCT Glucose    Collection Time: 01/07/18  8:24 PM   Result Value Ref Range    Glucose, POC 240 (H) 65 - 179 mg/dL   Basic metabolic panel    Collection Time: 01/08/18  5:40 AM   Result Value Ref Range    Sodium 136 135 - 145 mmol/L    Potassium 4.2 3.5 - 5.0 mmol/L    Chloride 100 98 - 107 mmol/L    CO2 24.0 22.0 - 30.0 mmol/L    BUN 16 7 - 21 mg/dL    Creatinine 0.27 2.53 - 1.30 mg/dL    BUN/Creatinine Ratio 20  EGFR MDRD Non Af Amer >=60 >=60 mL/min/1.86m2    EGFR MDRD Af Amer >=60 >=60 mL/min/1.45m2    Anion Gap 12 9 - 15 mmol/L    Glucose 159 65 - 179 mg/dL    Calcium 9.2 8.5 - 45.4 mg/dL   CBC    Collection Time: 01/08/18  5:40 AM   Result Value Ref Range    WBC 7.3 4.5 - 11.0 10*9/L    RBC 4.92 4.50 - 5.90 10*12/L    HGB 12.4 (L) 13.5 - 17.5 g/dL    HCT 09.8 (L) 11.9 - 53.0 %    MCV 79.8 (L) 80.0 - 100.0 fL    MCH 25.2 (L) 26.0 - 34.0 pg    MCHC 31.6 31.0 - 37.0 g/dL    RDW 14.7 (H) 82.9 - 15.0 %    MPV 9.7 7.0 - 10.0 fL    Platelet 269 150 - 440 10*9/L   Magnesium Level    Collection Time: 01/08/18  5:40 AM   Result Value Ref Range    Magnesium 1.9 1.6 - 2.2 mg/dL   POCT Glucose    Collection Time: 01/08/18  8:03 AM   Result Value Ref Range    Glucose, POC 136 65 - 179 mg/dL       Relevant Studies/Radiology (if blank, then none):  Ct Chest Wo Contrast    Result Date: 12/30/2017  EXAM: CT CHEST WO CONTRAST DATE: 12/29/2017 8:01 PM ACCESSION: [id number] UN DICTATED: 12/29/2017 8:03 PM INTERPRETATION LOCATION: [facility] CLINICAL INDICATION: 32 years old Male with Other- CF, hx of lobectomy and M. abscessus-  COMPARISON: Chest CTA dated routine TECHNIQUE: A spiral CT scan was obtained without IV contrast from the thoracic inlet through the hemidiaphragms. Images were reconstructed in the axial plane.  Coronal and sagittal reformatted images of the chest were also provided for further evaluation of the lung parenchyma. FINDINGS: Right chest Port-A-Cath terminates in the deep right atrium. AIRWAYS, LUNGS, PLEURA: Sequela of right upper lobectomy. Bronchiectasis and bronchial wall thickening is again seen bilaterally most pronounced in the lower lobes and left lingula. No pleural effusion. MEDIASTINUM: Normal heart size. No pericardial effusion. Normal caliber thoracic aorta.  1.2 cm right hilar node (3:50), unchanged. IMAGED ABDOMEN: Hepatic steatosis. Mild fatty infiltration of the pancreas. The gallbladder contains high density material likely reflecting sludge. Splenomegaly measuring 15 cm.Marland Kitchen SOFT TISSUES: Bilateral gynecomastia.Marland Kitchen BONES: Postsurgical changes to the right posterior sixth rib.     --Sequela of right upper lobectomy with redemonstration of bronchiectasis, bronchial wall thickening, and nodular tree-in-bud opacities in the lower lobes appearing similar to prior which may reflect chronic mycobacterial infection.    Xr Chest 2 Views    Result Date: 12/29/2017  EXAM: CHEST TWO VIEW DATE: 12/29/2017 1529 ACCESSION: [id number] UN DICTATED: 12/29/2017 3:37 PM INTERPRETATION LOCATION: [facility] CLINICAL INDICATION: 31 years old Male with COUGH--  COMPARISON: 08/19/2017 TECHNIQUE:PA and lateral views of the chest. FINDINGS: Right chest port catheter tip terminates over the lower right atrium. No lung volumes. Unchanged linear opacity within the right upper lobe. No consolidation or pulmonary edema. No pneumothorax or pleural effusion. Cardiomediastinal silhouette within normal limits. No acute osseous abnormality.     No acute airspace disease.    ______________________________________________________________________  Discharge Instructions:               Follow Up instructions and Outpatient Referrals     Ambulatory referral to Endocrinology      Please schedule f/u appt for CF-related diabetes in early July  2019.         Ambulatory referral to Pulmonology      Please schedule f/u appt with Dr. Koren Shiver for CF mgmt in early July 2019.         Discharge instructions      Mr. Olmo, it was great to meet you during your stay at [facility]. You were here in the hospital for a cystic fibrosis exacerbation. We have been treating it with your airway clearance regimen as well as antibiotics. You receive Ciprofloxacin and Tobramycin during this hospital stay, but we will have you complete your course on Ciprofloxacin and, instead of Tobramycin, Arikayce. Please take the Ciprofloxacin and Arikayce until June 1st.    We will plan to have follow up appointments eventually set up with Dr. Koren Shiver in Pulmonary clinic and with Endocrinology. You will receive a call relatively soon about when these will be scheduled.                 ______________________________________________________________________  Discharge Day Services:  BP 125/83  - Pulse 101  - Temp 36.5 ??C (Oral)  - Resp 18  - Ht 182.9 cm (6')  - Wt (!) 119.2 kg (262 lb 12.6 oz)  - SpO2 96%  - BMI 35.64 kg/m??   Pt seen on the day of discharge and determined appropriate for discharge.    Condition at Discharge: good    Length of Discharge: I spent greater than 30 mins in the discharge of this patient.

## 2018-01-07 NOTE — Unmapped (Signed)
Endocrinology Consult Note    Requesting Attending Physician :  Raechel Ache Lauris Poag,*  Service Requesting Consult : Pulmonology (MDG)  Primary Care Provider: [facility]  Outpatient Endocrinologist: N/A    Assessment/Recommendations:    1. CFRD    *No new recs. FPG 180 today, started higher dose of nutritional insulin today.  -Continue Lantus 14u qd  -continue Lispro to 12u qAC  -Continue sensitive qACHS  -Please advise to changes to diet or plan for steroids      Thank you for the consult. We will continue to follow patient as needed. Please call/page [phone] with questions or concerns.           History of Present Illness: :    Reason for Consult:   CFRD    Christian Young is a 32 y.o. male admitted for Cystic fibrosis with pulmonary exacerbation (CMS-HCC). I have been asked to evaluate Remy for CFRD. Per last endo note (seen by Dr. Monia Sabal in Feb 2019), he had been on Lantus 5u qd in the past (supervised by [facility] endo), but then went off of insulin for 3 years after moving to [place]. Last a1c 7.6. At his last visit, he was advised to restart Lantus 5u qd with instructions for self titration, Novolog 4u qAC and 2:50>200. He confirms that he's currently taking Lantus 11u qd, Novolog 5u qAC and 2:50>200 with adequate control at home.     Interval history: Noted to have postprandial BG excursions despite increasing doses.  180-190 today.    Past Medical History:    Medical History:    Past Medical History:   Diagnosis Date   ??? Anxiety    ??? Chronic pain disorder    ??? Cystic fibrosis (CMS-HCC)    ??? Depression    ??? Hypertension        Surgical History:    Past Surgical History:   Procedure Laterality Date   ??? PR REMOVAL OF LUNG,LOBECTOMY Right 03/29/2017    Procedure: REMOVAL OF LUNG, OTHER THAN PNEUMONECTOMY; SINGLE LOBE (LOBECTOMY);  Surgeon: Cherie Dark, MD;  Location: MAIN OR [facility];  Service: Thoracic       Allergies:    Baruch Goldmann lysine]; Cefepime; Other; Slo-bid 100; Banana; and Tobramycin    All Medications:     Current Facility-Administered Medications   Medication Dose Route Frequency Provider Last Rate Last Dose   ??? albuterol 2.5 mg /3 mL (0.083 %) nebulizer solution 2.5 mg  2.5 mg Nebulization 4x Daily (RT) Blair Dolphin, MD   2.5 mg at 01/07/18 0930   ??? azithromycin (ZITHROMAX) tablet 500 mg  500 mg Oral Daily Latrelle Dodrill, MD   500 mg at 01/07/18 4540   ??? budesonide-formoterol (SYMBICORT) 160-4.5 mcg/actuation inhaler 2 puff  2 puff Inhalation BID Latrelle Dodrill, MD   2 puff at 01/07/18 0930   ??? busPIRone (BUSPAR) tablet 30 mg  30 mg Oral BID Latrelle Dodrill, MD   30 mg at 01/07/18 9811   ??? carvedilol (COREG) tablet 3.125 mg  3.125 mg Oral BID Latrelle Dodrill, MD   3.125 mg at 01/07/18 9147   ??? cetirizine (ZyrTEC) tablet 10 mg  10 mg Oral Daily Latrelle Dodrill, MD   10 mg at 01/07/18 8295   ??? cholecalciferol (vitamin D3) tablet 1,000 Units  1,000 Units Oral Daily Latrelle Dodrill, MD   1,000 Units at 01/07/18 [phone]   ??? ciprofloxacin HCl (CIPRO) tablet 750 mg  750 mg Oral Q12H SCH Hitesh H  Allena Katz, MD   750 mg at 01/07/18 0811   ??? Clofazimine 50 mg capsule  2 capsule Oral Daily with lunch Latrelle Dodrill, MD   2 capsule at 01/06/18 1231   ??? dextrose (D10W) 10% bolus 125 mL  12.5 g Intravenous Q30 Min PRN Blair Dolphin, MD       ??? dornase alfa (PULMOZYME) 1 mg/mL nebulizer solution 2.5 mg  2.5 mg Inhalation BID (RT) Latrelle Dodrill, MD   2.5 mg at 01/07/18 0930   ??? heparin, porcine (PF) 100 unit/mL injection 500 Units  500 Units Intravenous Q8H PRN Blair Dolphin, MD   500 Units at 01/05/18 1834   ??? insulin glargine (LANTUS) injection 14 Units  14 Units Subcutaneous Nightly Edger House, MD   14 Units at 01/06/18 2108   ??? insulin lispro (HumaLOG) injection 0-2.5 Units  0-2.5 Units Subcutaneous Nightly Edger House, MD   1 Units at 01/06/18 2109   ??? insulin lispro (HumaLOG) injection 0-5 Units  0-5 Units Subcutaneous TID AC Edger House, MD   1 Units at 01/07/18 0809 ??? insulin lispro (HumaLOG) injection 12 Units  12 Units Subcutaneous TID AC Edger House, MD   12 Units at 01/07/18 0809   ??? insulin lispro (HumaLOG) injection 4 Units  4 Units Subcutaneous Daily PRN Edger House, MD       ??? lamoTRIgine (LaMICtal) tablet 100 mg  100 mg Oral Daily Latrelle Dodrill, MD   100 mg at 01/07/18 0811   ??? lisinopril (PRINIVIL,ZESTRIL) tablet 10 mg  10 mg Oral Nightly Latrelle Dodrill, MD   10 mg at 01/06/18 2057   ??? melatonin tablet 6 mg  6 mg Oral Nightly Latrelle Dodrill, MD   6 mg at 01/06/18 2106   ??? montelukast (SINGULAIR) tablet 10 mg  10 mg Oral Nightly Latrelle Dodrill, MD   10 mg at 01/06/18 2107   ??? MVW Complete (pediatric multivit 61-D3-vit K) 1,500-800 unit-mcg 1 capsule  1 capsule Oral BID Latrelle Dodrill, MD   1 capsule at 01/06/18 1742   ??? oxyCODONE (ROXICODONE) immediate release tablet 5 mg  5 mg Oral Q4H PRN Malachi Carl, MD   5 mg at 01/02/18 1222    Or   ??? oxyCODONE (ROXICODONE) immediate release tablet 10 mg  10 mg Oral Q4H PRN Malachi Carl, MD   10 mg at 01/07/18 0500   ??? pancrelipase (Lip-Prot-Amyl) (CREON) 24,000-76,000 -120,000 unit delayed release capsule 192,000-216,000 units of lipase  8-9 capsule Oral With snacks Latrelle Dodrill, MD       ??? pancrelipase (Lip-Prot-Amyl) (CREON) 24,000-76,000 -120,000 unit delayed release capsule 240,000-288,000 units of lipase  10-12 capsule Oral 3xd Meals Latrelle Dodrill, MD   288,000 units of lipase at 01/07/18 0813   ??? pantoprazole (PROTONIX) EC tablet 20 mg  20 mg Oral Daily Latrelle Dodrill, MD   20 mg at 01/07/18 6962   ??? polyethylene glycol (MIRALAX) packet 17 g  17 g Oral Daily PRN Latrelle Dodrill, MD       ??? pramipexole (MIRAPEX) tablet 0.25 mg  0.25 mg Oral Nightly Latrelle Dodrill, MD   0.25 mg at 01/06/18 2107   ??? pregabalin (LYRICA) capsule 300 mg  300 mg Oral BID Latrelle Dodrill, MD   300 mg at 01/07/18 9528   ??? promethazine (PHENERGAN) tablet 12.5 mg  12.5 mg Oral Q6H PRN Edger House, MD   12.5 mg at 01/06/18 0925   ??? rivaroxaban (XARELTO) tablet 20  mg  20 mg Oral Daily Latrelle Dodrill, MD   20 mg at 01/06/18 1742   ??? senna (SENOKOT) tablet 2 tablet  2 tablet Oral Nightly PRN Latrelle Dodrill, MD       ??? sodium chloride 0.9% (NS BOLUS) bolus 1,000 mL  1,000 mL Intravenous Once Edger House, MD       ??? sodium chloride 3 % nebulizer solution 4 mL  4 mL Nebulization Q8H PRN Edger House, MD       ??? sodium chloride 7% nebulizer solution 4 mL  4 mL Nebulization 4x Daily (RT) Latrelle Dodrill, MD   4 mL at 01/07/18 0930   ??? tezacaftor 100mg /ivacaftor 150mg  and ivacaftor 150mg  (SYMDEKO) tablets  1 tablet Oral BID Latrelle Dodrill, MD   1 tablet at 01/07/18 [phone]   ??? traZODone (DESYREL) tablet 100 mg  100 mg Oral Nightly Latrelle Dodrill, MD   100 mg at 01/06/18 2107   ??? vilazodone 40 mg tablet 40 mg  1 tablet Oral Daily Latrelle Dodrill, MD   40 mg at 01/07/18 9604       Social History:     Social History     Socioeconomic History   ??? Marital status: Married     Spouse name: None   ??? Number of children: None   ??? Years of education: None   ??? Highest education level: None   Occupational History   ??? None   Social Needs   ??? Financial resource strain: None   ??? Food insecurity:     Worry: None     Inability: None   ??? Transportation needs:     Medical: None     Non-medical: None   Tobacco Use   ??? Smoking status: Never Smoker   ??? Smokeless tobacco: Never Used   Substance and Sexual Activity   ??? Alcohol use: No   ??? Drug use: No   ??? Sexual activity: Yes     Partners: Female     Birth control/protection: Condom   Lifestyle   ??? Physical activity:     Days per week: None     Minutes per session: None   ??? Stress: None   Relationships   ??? Social connections:     Talks on phone: None     Gets together: None     Attends religious service: None     Active member of club or organization: None     Attends meetings of clubs or organizations: None     Relationship status: None   Other Topics Concern   ??? None   Social History Narrative   ??? None         Family History:    The patient's family history includes Bipolar disorder in his mother; Depression in his mother..    Code Status:    Full Code    Review of Systems:    A 12 system review of systems was negative except as noted in HPI.    Objective: :    Patient Vitals for the past 8 hrs:   BP Temp Temp src Pulse Resp SpO2   01/07/18 0930 ??? ??? ??? 97 17 96 %   01/07/18 0445 137/79 35.8 ??C (96.4 ??F) Oral 88 14 95 %       No intake/output data recorded.    Physical Exam:    General appearance - alert, well appearing, and in no distress  Mental status - alert, oriented to person, place,  and time  Eyes -  Sclera anicteric, EOM's intact  Resp - normal WOB. Speaking in full sentences without difficulty or noted dyspnea.  Neurological - alert, oriented, normal speech, no grossly abnormal focal findings.  Psych - normal affect and behavior  Skin - No rash or lesions, no acanthosis    Test Results    Data Review  Lab Results   Component Value Date    A1C 6.1 (H) 11/19/2016    A1C 6.3 (H) 11/18/2016     Lab Results   Component Value Date    CREATININE 0.96 01/07/2018

## 2018-01-07 NOTE — Unmapped (Signed)
[facility] Inpatient Service  Daily Progress Note     Interval/Overnight/Subjective:      NAEON. No new complaints. Using [place] while watching the movie Baywatch during rounds this AM. Plan for discharge tomorrow and to complete rest of ABX course via PO Cipro & Arikayce. Wife will bring [facility] paperwork tomorrow. Will give 1L NS over 6hrs since on IV Tobra.     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 *    [name] is a 32 y.o. male with PMHx  of CF (genotype S945L and [id number]), DM,  HTN, pancreatic insufficiency, s/p right upper lobe who presented to [facility] on 12/29/17 with a CF exacerbation.     CF exacerbation w/ pulmonary manifestations, acute exacerbation of bronchiectasis: Genotype S945L and [id number], last FEV1 of 67.1 % on 3/14. Culture Hx of Sm PsA (sensitive to Cipro, Tobra, Intermediate to zosyn, levo, cefepime and aztreo), M. Abscessus, S. Maltophilia, and OPF. Hx of R-partial lobectomy August 2018.  At home on daily azithromycin, clofazimime, and inhaled amikacin for hx of M. Abscess. CT showed generalized bronchiectasis, with nodular tree-in-bud opacities in lower lobes. RPP on 5/19 was negative.  - AC: Pulmozyme BID, Albuterol nebs, 7 % HTS QID, Aerobika, Chest Vest   - f/u induced CF sputum results  - IV Tobramycin and PO Cipro (5/19 - ), plan to complete course with PO Cipro and Arikayce (end-date on 6/1)  - home Azithromycin and Clofazimine (history of NTM infection)   - Symdeko BID   - Symbicort BID    CF-related Pancreatic Insufficiency.  - Creon (7-8 tablets w/ meals, 5-6 tablets with snacks)  - MVW BID, vitamin D3 1000 U daily  - Hold home pro-biotics CF GI Disease: Patient reports distant history of requiring Golytely for constipation. Denies current symptoms.   - PRN miralax and senna     History of DVT: UE clots a/w PICC line, hx of unprovoked LE DVTs.   - home Xarelto    CFRD.  - Endocrinology consulted  - Lantus 14U QD  - Lispro 12U qAC   - Lispro 4U w/ snacks PRN  - Lispro sensitive qACHS    Chronic stable medical conditions:   HTN: home Lisinopril and Coreg  Chronic Pain: Lyrica 300mg  BID, Tramadol 100mg  q6hrs PRN   Chronic Sinusitis: Zyrtec, Singulair  RLS: Home pramipexole  Depression: Buspar, vilazodone, lamotrigine  Insomnia: Melatonin 9mg , Trazodone 100mg      Daily Checklist:   Diet: regular  GI ppx: home PPI  DVT ppx: home Xarelto  Dispo: [facility], floor status  Code Status: Full Code  ___________________________________________________________________    Labs/Studies:  Labs and Studies from the last 24hrs per EMR and Reviewed    Physical Exam:  Vitals:    01/07/18 0445   BP: 137/79   Pulse: 88   Resp: 14   Temp: 35.8 ??C   SpO2: 95%     Physical Exam:  GEN: pleasant Caucasian gentleman in no acute distress  PULM: normal respiratory effort, no increased WOB  CV: RRR  ABD: soft, NT/ND  NEURO: nonfocal, alert, oriented

## 2018-01-07 NOTE — Unmapped (Signed)
Endocrinology Consult Note    Requesting Attending Physician :  Raechel Ache Lauris Poag,*  Service Requesting Consult : Pulmonology ([facility])  Primary Care Provider: [facility]  Outpatient Endocrinologist: N/A    Assessment/Recommendations:    1. CFRD  -Continue Lantus 14u qd  -Increase Lispro to 12u qAC  -Continue sensitive qACHS  -Please advise to changes to diet or plan for steroids    The patient was seen and discussed with Dr. Elijah Birk    Thank you for the consult. We will continue to follow patient as needed. Please call/page [phone] with questions or concerns.     Alfredia Ferguson, MD  [facility] Endocrinology Fellow      History of Present Illness: :    Reason for Consult:   CFRD    Christian Young is a 32 y.o. male admitted for Cystic fibrosis with pulmonary exacerbation (CMS-HCC). I have been asked to evaluate Masaji for CFRD. Per last endo note (seen by Dr. Monia Sabal in Feb 2019), he had been on Lantus 5u qd in the past (supervised by [facility] endo), but then went off of insulin for 3 years after moving to [place]. Last a1c 7.6. At his last visit, he was advised to restart Lantus 5u qd with instructions for self titration, Novolog 4u qAC and 2:50>200. He confirms that he's currently taking Lantus 11u qd, Novolog 5u qAC and 2:50>200 with adequate control at home.     Interval history: Doing well today. Noted to have postprandial BG excursions. Discussed to increase insulin doses.     Past Medical History:    Medical History:    Past Medical History:   Diagnosis Date   ??? Anxiety    ??? Chronic pain disorder    ??? Cystic fibrosis (CMS-HCC)    ??? Depression    ??? Hypertension        Surgical History:    Past Surgical History:   Procedure Laterality Date   ??? PR REMOVAL OF LUNG,LOBECTOMY Right 03/29/2017    Procedure: REMOVAL OF LUNG, OTHER THAN PNEUMONECTOMY; SINGLE LOBE (LOBECTOMY);  Surgeon: Cherie Dark, MD;  Location: MAIN OR [facility];  Service: Thoracic       Allergies:    Baruch Goldmann lysine]; Cefepime; Other; Slo-bid 100; Banana; and Tobramycin    All Medications:     Current Facility-Administered Medications   Medication Dose Route Frequency Provider Last Rate Last Dose   ??? albuterol 2.5 mg /3 mL (0.083 %) nebulizer solution 2.5 mg  2.5 mg Nebulization 4x Daily (RT) Blair Dolphin, MD   2.5 mg at 01/06/18 1703   ??? azithromycin (ZITHROMAX) tablet 500 mg  500 mg Oral Daily Latrelle Dodrill, MD   500 mg at 01/06/18 0831   ??? budesonide-formoterol (SYMBICORT) 160-4.5 mcg/actuation inhaler 2 puff  2 puff Inhalation BID Latrelle Dodrill, MD   2 puff at 01/06/18 0858   ??? busPIRone (BUSPAR) tablet 30 mg  30 mg Oral BID Latrelle Dodrill, MD   30 mg at 01/06/18 0831   ??? carvedilol (COREG) tablet 3.125 mg  3.125 mg Oral BID Latrelle Dodrill, MD   3.125 mg at 01/06/18 0831   ??? cetirizine (ZyrTEC) tablet 10 mg  10 mg Oral Daily Latrelle Dodrill, MD   10 mg at 01/06/18 4540   ??? cholecalciferol (vitamin D3) tablet 1,000 Units  1,000 Units Oral Daily Latrelle Dodrill, MD   1,000 Units at 01/06/18 0831   ??? ciprofloxacin HCl (CIPRO) tablet 750 mg  750 mg Oral Q12H  [facility] Malachi Carl, MD   750 mg at 01/06/18 0831   ??? Clofazimine 50 mg capsule  2 capsule Oral Daily with lunch Latrelle Dodrill, MD   2 capsule at 01/06/18 1231   ??? dextrose (D10W) 10% bolus 125 mL  12.5 g Intravenous Q30 Min PRN Blair Dolphin, MD       ??? dornase alfa (PULMOZYME) 1 mg/mL nebulizer solution 2.5 mg  2.5 mg Inhalation BID (RT) Latrelle Dodrill, MD   2.5 mg at 01/06/18 0858   ??? heparin, porcine (PF) 100 unit/mL injection 500 Units  500 Units Intravenous Q8H PRN Blair Dolphin, MD   500 Units at 01/05/18 1834   ??? insulin glargine (LANTUS) injection 14 Units  14 Units Subcutaneous Nightly Edger House, MD   14 Units at 01/05/18 2026   ??? insulin lispro (HumaLOG) injection 0-2.5 Units  0-2.5 Units Subcutaneous Nightly Edger House, MD   1 Units at 01/05/18 2213   ??? insulin lispro (HumaLOG) injection 0-5 Units  0-5 Units Subcutaneous TID AC Edger House, MD   1 Units at 01/06/18 1231   ??? insulin lispro (HumaLOG) injection 12 Units  12 Units Subcutaneous TID AC Edger House, MD   12 Units at 01/06/18 1741   ??? insulin lispro (HumaLOG) injection 4 Units  4 Units Subcutaneous Daily PRN Edger House, MD       ??? lamoTRIgine (LaMICtal) tablet 100 mg  100 mg Oral Daily Latrelle Dodrill, MD   100 mg at 01/06/18 1610   ??? lisinopril (PRINIVIL,ZESTRIL) tablet 10 mg  10 mg Oral Nightly Latrelle Dodrill, MD   10 mg at 01/05/18 2025   ??? melatonin tablet 6 mg  6 mg Oral Nightly Latrelle Dodrill, MD   6 mg at 01/05/18 2026   ??? montelukast (SINGULAIR) tablet 10 mg  10 mg Oral Nightly Latrelle Dodrill, MD   10 mg at 01/05/18 2025   ??? MVW Complete (pediatric multivit 61-D3-vit K) 1,500-800 unit-mcg 1 capsule  1 capsule Oral BID Latrelle Dodrill, MD   1 capsule at 01/06/18 1742   ??? oxyCODONE (ROXICODONE) immediate release tablet 5 mg  5 mg Oral Q4H PRN Malachi Carl, MD   5 mg at 01/02/18 1222    Or   ??? oxyCODONE (ROXICODONE) immediate release tablet 10 mg  10 mg Oral Q4H PRN Malachi Carl, MD   10 mg at 01/05/18 2306   ??? pancrelipase (Lip-Prot-Amyl) (CREON) 24,000-76,000 -120,000 unit delayed release capsule 192,000-216,000 units of lipase  8-9 capsule Oral With snacks Latrelle Dodrill, MD       ??? pancrelipase (Lip-Prot-Amyl) (CREON) 24,000-76,000 -120,000 unit delayed release capsule 240,000-288,000 units of lipase  10-12 capsule Oral 3xd Meals Latrelle Dodrill, MD   288,000 units of lipase at 01/06/18 1743   ??? pantoprazole (PROTONIX) EC tablet 20 mg  20 mg Oral Daily Latrelle Dodrill, MD   20 mg at 01/06/18 0831   ??? polyethylene glycol (MIRALAX) packet 17 g  17 g Oral Daily PRN Latrelle Dodrill, MD       ??? pramipexole (MIRAPEX) tablet 0.25 mg  0.25 mg Oral Nightly Latrelle Dodrill, MD   0.25 mg at 01/05/18 2026   ??? pregabalin (LYRICA) capsule 300 mg  300 mg Oral BID Latrelle Dodrill, MD   300 mg at 01/06/18 0830   ??? promethazine (PHENERGAN) tablet 12.5 mg  12.5 mg Oral Q6H PRN Edger House, MD   12.5 mg at 01/06/18 9604   ???  rivaroxaban (XARELTO) tablet 20 mg  20 mg Oral Daily Latrelle Dodrill, MD   20 mg at 01/06/18 1742   ??? senna (SENOKOT) tablet 2 tablet  2 tablet Oral Nightly PRN Latrelle Dodrill, MD       ??? sodium chloride 3 % nebulizer solution 4 mL  4 mL Nebulization Q8H PRN Edger House, MD       ??? sodium chloride 7% nebulizer solution 4 mL  4 mL Nebulization 4x Daily (RT) Latrelle Dodrill, MD   4 mL at 01/06/18 1703   ??? tezacaftor 100mg /ivacaftor 150mg  and ivacaftor 150mg  (SYMDEKO) tablets  1 tablet Oral BID Latrelle Dodrill, MD   1 tablet at 01/06/18 0829   ??? tobramycin (NEBCIN) 250 mg in sodium chloride (NS) 0.9 % 100 mL IVPB  250 mg Intravenous Q12H Edger House, MD   Stopped at 01/06/18 0933   ??? traZODone (DESYREL) tablet 100 mg  100 mg Oral Nightly Latrelle Dodrill, MD   100 mg at 01/05/18 2025   ??? vilazodone 40 mg tablet 40 mg  1 tablet Oral Daily Latrelle Dodrill, MD   40 mg at 01/06/18 0830       Social History:     Social History     Socioeconomic History   ??? Marital status: Married     Spouse name: None   ??? Number of children: None   ??? Years of education: None   ??? Highest education level: None   Occupational History   ??? None   Social Needs   ??? Financial resource strain: None   ??? Food insecurity:     Worry: None     Inability: None   ??? Transportation needs:     Medical: None     Non-medical: None   Tobacco Use   ??? Smoking status: Never Smoker   ??? Smokeless tobacco: Never Used   Substance and Sexual Activity   ??? Alcohol use: No   ??? Drug use: No   ??? Sexual activity: Yes     Partners: Female     Birth control/protection: Condom   Lifestyle   ??? Physical activity:     Days per week: None     Minutes per session: None   ??? Stress: None   Relationships   ??? Social connections:     Talks on phone: None     Gets together: None     Attends religious service: None     Active member of club or organization: None     Attends meetings of clubs or organizations: None     Relationship status: None   Other Topics Concern   ??? None   Social History Narrative   ??? None         Family History:    The patient's family history includes Bipolar disorder in his mother; Depression in his mother..    Code Status:    Full Code    Review of Systems:    A 12 system review of systems was negative except as noted in HPI.    Objective: :    Patient Vitals for the past 8 hrs:   BP Temp Temp src Pulse Resp SpO2   01/06/18 1441 117/77 36.1 ??C Oral 98 16 95 %       I/O this shift:  In: 116.3 [IV Piggyback:116.3]  Out: -     Physical Exam:    General appearance - alert, well appearing, and in no distress  Mental status -  alert, oriented to person, place, and time  Eyes -  Sclera anicteric, EOM's intact  Resp - normal WOB. Speaking in full sentences without difficulty or noted dyspnea.  Neurological - alert, oriented, normal speech, no grossly abnormal focal findings.  Psych - normal affect and behavior  Skin - No rash or lesions, no acanthosis    Test Results    Data Review  Lab Results   Component Value Date    A1C 6.1 (H) 11/19/2016    A1C 6.3 (H) 11/18/2016     Lab Results   Component Value Date    CREATININE 0.97 01/06/2018

## 2018-01-07 NOTE — Unmapped (Signed)
Afebrile, VSS. Oxycodone X2 for pain. Pt remains free from falls

## 2018-01-07 NOTE — Unmapped (Signed)
Pharmacy Note: Tobramycin Dosing / Monitoring   Christian Young 32 y.o. male    Admission: 12/29/2017    CF - exacerbation    Tobramycin 260mg  q12h (1h infusion)    Reported labs    5/28 bun/scr: 17/0.96  (5/20 bun/scr: 18/0.73)    Tobra   5/27 tobra = 11.35mcg/mL at 0043 (peak/C1) drawn ~ 1h after dose via central line   5/27 tobra = 1.23mcg/mL at 2120 (TROUGH/C2) drawn PRIOR to dose via central line    ?? True trough is above acceptable level - however these levels were drawn via central line -- so concern for sample contamination  ?? bun/scr slight incr from adm but flucuated b/w 0.7-0.9,   ?? UOP not well recorded.      Recommend at this time   1. Stop all tobra   2. Check tobra trough at '10am' peripherally, when next dose would have been due   3. Give IVF bolus (if can tolerate)   4. If the morning tobra level is still > 63mcg/mL -- do not restart tobramycin x 24h    Pharmacy will follow along with medical team    Jakiera Ehler, PharmD  [facility] Team Pharmacist for Jan 07, 2018 2:11 PM

## 2018-01-08 LAB — CO2: Carbon dioxide:SCnc:Pt:Ser/Plas:Qn:: 24

## 2018-01-08 LAB — BASIC METABOLIC PANEL
ANION GAP: 12 mmol/L (ref 9–15)
BLOOD UREA NITROGEN: 16 mg/dL (ref 7–21)
BUN / CREAT RATIO: 20
CHLORIDE: 100 mmol/L (ref 98–107)
CREATININE: 0.81 mg/dL (ref 0.70–1.30)
EGFR MDRD AF AMER: >=60 mL/min/{1.73_m2} (ref >=60)
GLUCOSE RANDOM: 159 mg/dL (ref 65–179)
POTASSIUM: 4.2 mmol/L (ref 3.5–5.0)
SODIUM: 136 mmol/L (ref 135–145)

## 2018-01-08 LAB — CBC
HEMATOCRIT: 39.2 % — ABNORMAL LOW (ref 41.0–53.0)
MEAN CORPUSCULAR HEMOGLOBIN CONC: 31.6 g/dL (ref 31.0–37.0)
MEAN CORPUSCULAR HEMOGLOBIN: 25.2 pg — ABNORMAL LOW (ref 26.0–34.0)
MEAN CORPUSCULAR VOLUME: 79.8 fL — ABNORMAL LOW (ref 80.0–100.0)
MEAN PLATELET VOLUME: 9.7 fL (ref 7.0–10.0)
RED BLOOD CELL COUNT: 4.92 10*12/L (ref 4.50–5.90)
RED CELL DISTRIBUTION WIDTH: 15.9 % — ABNORMAL HIGH (ref 12.0–15.0)
WBC ADJUSTED: 7.3 10*9/L (ref 4.5–11.0)

## 2018-01-08 LAB — MEAN CORPUSCULAR HEMOGLOBIN: Lab: 25.2 — ABNORMAL LOW

## 2018-01-08 LAB — MAGNESIUM: Magnesium:MCnc:Pt:Ser/Plas:Qn:: 1.9

## 2018-01-08 MED ORDER — CIPROFLOXACIN 750 MG TABLET
ORAL_TABLET | Freq: Two times a day (BID) | ORAL | 0 refills | 0 days | Status: CP
Start: 2018-01-08 — End: 2018-01-11

## 2018-01-08 NOTE — Unmapped (Signed)
Endocrinology Consult Note    Requesting Attending Physician :  Christian Young,*  Service Requesting Consult : Pulmonology ([facility])  Primary Care Provider: [facility]  Outpatient Endocrinologist: Christian Starch, MD (last saw Christian Young but missed appt with Christian Young in [place])    Assessment/Recommendations:    1. CFRD    *No new recs. FPG 136 today, started higher dose of nutritional insulin yesterday and BG improved some.    D/c on lantus 14u qhs, novolog 12u qAC. Current A1C 9.0%. Pt to reschedule as he missed appt with Christian Young on Christian Young [address].    Insulin is recommended in patients with CFRD as it improves pulmonary function and nutrition in CFRD. Arvanitakis et al 2001, Rolon et al 2001.    -Continue Lantus 14u qd  -continue Lispro to 12u qAC for now; if remains admitted may need to increase  -Continue sensitive qACHS  -Please advise to changes to diet or plan for steroids      Thank you for the consult. We will continue to follow patient as needed. Please call/page [phone] with questions or concerns.           History of Present Illness: :    Reason for Consult:   CFRD    Christian Young is a 32 y.o. male admitted for Cystic fibrosis with pulmonary exacerbation (CMS-HCC). I have been asked to evaluate Christian Young for CFRD. Per last endo note (seen by Dr. Monia Young in Feb 2019), he had been on Lantus 5u qd in the past (supervised by [facility] endo), but then went off of insulin for 3 years after moving to [place]. Last a1c 7.6. At his last visit, he was advised to restart Lantus 5u qd with instructions for self titration, Novolog 4u qAC and 2:50>200. He confirms that he's currently taking Lantus 11u qd, Novolog 5u qAC and 2:50>200 with adequate control at home.     Interval history: Noted to have postprandial BG excursions despite increasing doses, but BG improving.     Past Medical History:    Medical History:    Past Medical History:   Diagnosis Date   ??? Anxiety    ??? Chronic pain disorder ??? Cystic fibrosis (CMS-HCC)    ??? Depression    ??? Hypertension        Surgical History:    Past Surgical History:   Procedure Laterality Date   ??? PR REMOVAL OF LUNG,LOBECTOMY Right 03/29/2017    Procedure: REMOVAL OF LUNG, OTHER THAN PNEUMONECTOMY; SINGLE LOBE (LOBECTOMY);  Surgeon: Christian Dark, MD;  [facility];  Service: Thoracic       Allergies:    Baruch Goldmann lysine]; Cefepime; Other; Slo-bid 100; Banana; and Tobramycin    All Medications:     Current Facility-Administered Medications   Medication Dose Route Frequency Provider Last Rate Last Dose   ??? albuterol 2.5 mg /3 mL (0.083 %) nebulizer solution 2.5 mg  2.5 mg Nebulization 4x Daily (RT) Christian Dolphin, MD   2.5 mg at 01/07/18 2059   ??? azithromycin (ZITHROMAX) tablet 500 mg  500 mg Oral Daily Christian Dodrill, MD   500 mg at 01/08/18 0913   ??? budesonide-formoterol (SYMBICORT) 160-4.5 mcg/actuation inhaler 2 puff  2 puff Inhalation BID Christian Dodrill, MD   2 puff at 01/07/18 2100   ??? busPIRone (BUSPAR) tablet 30 mg  30 mg Oral BID Christian Dodrill, MD   30 mg at 01/08/18 0913   ??? carvedilol (COREG) tablet 3.125 mg  3.125 mg Oral BID Christian Dodrill, MD   3.125 mg at 01/08/18 0913   ??? cetirizine (ZyrTEC) tablet 10 mg  10 mg Oral Daily Christian Dodrill, MD   10 mg at 01/08/18 0913   ??? cholecalciferol (vitamin D3) tablet 1,000 Units  1,000 Units Oral Daily Christian Dodrill, MD   1,000 Units at 01/08/18 0913   ??? ciprofloxacin HCl (CIPRO) tablet 750 mg  750 mg Oral Q12H SCH Christian Carl, MD   750 mg at 01/08/18 0913   ??? Clofazimine 50 mg capsule  2 capsule Oral Daily with lunch Christian Dodrill, MD   2 capsule at 01/07/18 1202   ??? dextrose (D10W) 10% bolus 125 mL  12.5 g Intravenous Q30 Min PRN Christian Dolphin, MD       ??? dornase alfa (PULMOZYME) 1 mg/mL nebulizer solution 2.5 mg  2.5 mg Inhalation BID (RT) Christian Dodrill, MD   2.5 mg at 01/07/18 2100   ??? heparin, porcine (PF) 100 unit/mL injection 500 Units  500 Units Intravenous Q8H PRN Christian Dolphin, MD   500 Units at 01/07/18 2209   ??? insulin glargine (LANTUS) injection 14 Units  14 Units Subcutaneous Nightly Christian House, MD   14 Units at 01/07/18 2137   ??? insulin lispro (HumaLOG) injection 0-2.5 Units  0-2.5 Units Subcutaneous Nightly Christian House, MD   1 Units at 01/07/18 2106   ??? insulin lispro (HumaLOG) injection 0-5 Units  0-5 Units Subcutaneous TID AC Christian House, MD   1 Units at 01/07/18 1201   ??? insulin lispro (HumaLOG) injection 12 Units  12 Units Subcutaneous TID AC Christian House, MD   12 Units at 01/08/18 0830   ??? insulin lispro (HumaLOG) injection 4 Units  4 Units Subcutaneous Daily PRN Christian House, MD       ??? lamoTRIgine (LaMICtal) tablet 100 mg  100 mg Oral Daily Christian Dodrill, MD   100 mg at 01/08/18 0913   ??? lisinopril (PRINIVIL,ZESTRIL) tablet 10 mg  10 mg Oral Nightly Christian Dodrill, MD   10 mg at 01/07/18 2137   ??? melatonin tablet 6 mg  6 mg Oral Nightly Christian Dodrill, MD   6 mg at 01/07/18 2137   ??? montelukast (SINGULAIR) tablet 10 mg  10 mg Oral Nightly Christian Dodrill, MD   10 mg at 01/07/18 2137   ??? MVW Complete (pediatric multivit 61-D3-vit K) 1,500-800 unit-mcg 1 capsule  1 capsule Oral BID Christian Dodrill, MD   1 capsule at 01/07/18 1737   ??? oxyCODONE (ROXICODONE) immediate release tablet 5 mg  5 mg Oral Q4H PRN Christian Carl, MD   5 mg at 01/02/18 1222    Or   ??? oxyCODONE (ROXICODONE) immediate release tablet 10 mg  10 mg Oral Q4H PRN Christian Carl, MD   10 mg at 01/07/18 0500   ??? pancrelipase (Lip-Prot-Amyl) (CREON) 24,000-76,000 -120,000 unit delayed release capsule 192,000-216,000 units of lipase  8-9 capsule Oral With snacks Christian Dodrill, MD       ??? pancrelipase (Lip-Prot-Amyl) (CREON) 24,000-76,000 -120,000 unit delayed release capsule 240,000-288,000 units of lipase  10-12 capsule Oral 3xd Meals Christian Dodrill, MD   288,000 units of lipase at 01/08/18 0830   ??? pantoprazole (PROTONIX) EC tablet 20 mg  20 mg Oral Daily Christian Dodrill, MD   20 mg at 01/08/18 0913 ??? polyethylene glycol (MIRALAX) packet 17 g  17 g Oral Daily PRN Christian Dodrill, MD       ???  pramipexole (MIRAPEX) tablet 0.25 mg  0.25 mg Oral Nightly Christian Dodrill, MD   0.25 mg at 01/07/18 2137   ??? pregabalin (LYRICA) capsule 300 mg  300 mg Oral BID Christian Dodrill, MD   300 mg at 01/08/18 1027   ??? promethazine (PHENERGAN) tablet 12.5 mg  12.5 mg Oral Q6H PRN Christian House, MD   12.5 mg at 01/06/18 2536   ??? rivaroxaban (XARELTO) tablet 20 mg  20 mg Oral Daily Christian Dodrill, MD   20 mg at 01/07/18 1737   ??? senna (SENOKOT) tablet 2 tablet  2 tablet Oral Nightly PRN Christian Dodrill, MD       ??? sodium chloride 3 % nebulizer solution 4 mL  4 mL Nebulization Q8H PRN Christian House, MD       ??? sodium chloride 7% nebulizer solution 4 mL  4 mL Nebulization 4x Daily (RT) Christian Dodrill, MD   4 mL at 01/07/18 2100   ??? tezacaftor 100mg /ivacaftor 150mg  and ivacaftor 150mg  (SYMDEKO) tablets  1 tablet Oral BID Christian Dodrill, MD   1 tablet at 01/08/18 0914   ??? traZODone (DESYREL) tablet 100 mg  100 mg Oral Nightly Christian Dodrill, MD   100 mg at 01/07/18 2137   ??? vilazodone 40 mg tablet 40 mg  1 tablet Oral Daily Christian Dodrill, MD   40 mg at 01/08/18 6440       Social History:     Social History     Socioeconomic History   ??? Marital status: Married     Spouse name: None   ??? Number of children: None   ??? Years of education: None   ??? Highest education level: None   Occupational History   ??? None   Social Needs   ??? Financial resource strain: None   ??? Food insecurity:     Worry: None     Inability: None   ??? Transportation needs:     Medical: None     Non-medical: None   Tobacco Use   ??? Smoking status: Never Smoker   ??? Smokeless tobacco: Never Used   Substance and Sexual Activity   ??? Alcohol use: No   ??? Drug use: No   ??? Sexual activity: Yes     Partners: Female     Birth control/protection: Condom   Lifestyle   ??? Physical activity:     Days per week: None     Minutes per session: None   ??? Stress: None   Relationships   ??? Social connections:     Talks on phone: None     Gets together: None     Attends religious service: None     Active member of club or organization: None     Attends meetings of clubs or organizations: None     Relationship status: None   Other Topics Concern   ??? None   Social History Narrative   ??? None         Family History:    The patient's family history includes Bipolar disorder in his mother; Depression in his mother..    Code Status:    Full Code    Review of Systems:    A 12 system review of systems was negative except as noted in HPI.    Objective: :    Patient Vitals for the past 8 hrs:   BP Temp Temp src Pulse Resp SpO2   01/08/18 0600 125/83 36.5 ??C (97.7 ??F) Oral 101  18 96 %       No intake/output data recorded.    Physical Exam:    General appearance - alert, well appearing, and in no distress  Mental status - alert, oriented to person, place, and time  Eyes -  Sclera anicteric, EOM's intact  Resp - normal WOB. Speaking in full sentences without difficulty or noted dyspnea.  Neurological - alert, oriented, normal speech, no grossly abnormal focal findings.  Psych - normal affect and behavior  Skin - No rash or lesions, no acanthosis    Test Results    Data Review  Lab Results   Component Value Date    A1C 9.0 (H) 01/07/2018    A1C 6.1 (H) 11/19/2016    A1C 6.3 (H) 11/18/2016     Lab Results   Component Value Date    CREATININE 0.81 01/08/2018

## 2018-01-08 NOTE — Unmapped (Signed)
Patient tolerated scheduled treatments and airway clearance this morning without complication.

## 2018-01-08 NOTE — Unmapped (Signed)
Pt AOx4, calm and cooperative throughout shift. Independent with ADLs. No acute events. PRN heparin flush administered before de-accessing port. No complaints of pain. 1 unit of correctional insulin administered at bedtime. Contact precautions maintained. VSS, continue to monitor.     Problem: Adult Inpatient Plan of Care  Goal: Plan of Care Review  Outcome: Progressing  Goal: Patient-Specific Goal (Individualization)  Outcome: Progressing  Goal: Absence of Hospital-Acquired Illness or Injury  Outcome: Progressing  Goal: Optimal Comfort and Wellbeing  Outcome: Progressing     Problem: Diabetes Comorbidity  Goal: Blood Glucose Level Within Desired Range  Outcome: Progressing     Problem: Pain Chronic (Persistent) (Comorbidity Management)  Goal: Acceptable Pain Control and Functional Ability  Outcome: Progressing     Problem: Adjustment to Illness (Cystic Fibrosis)  Goal: Optimal Coping  Outcome: Progressing     Problem: Infection (Cystic Fibrosis)  Goal: Absence of Infection Signs/Symptoms  Outcome: Progressing     Problem: Malabsorption (Cystic Fibrosis)  Goal: Optimal Bowel Elimination  Outcome: Progressing     Problem: Oral Intake Inadequate (Cystic Fibrosis)  Goal: Optimal Nutrition Intake  Outcome: Progressing     Problem: Respiratory Compromise (Cystic Fibrosis)  Goal: Effective Oxygenation and Ventilation  Outcome: Progressing

## 2018-01-08 NOTE — Unmapped (Signed)
Dr Garner Nash referred pt to see Dr Denny Peon, Endocrinologist in our office at [facility]. I lm for pt to call back [phone]) to schedule.

## 2018-01-27 MED ORDER — CETIRIZINE 10 MG TABLET: 10 mg | tablet | Freq: Every day | 3 refills | 0 days | Status: AC

## 2018-01-27 MED ORDER — CETIRIZINE 10 MG TABLET
ORAL_TABLET | Freq: Every day | ORAL | 3 refills | 0.00000 days | Status: SS
Start: 2018-01-27 — End: 2019-02-01
  Filled 2018-04-21: qty 90, 90d supply, fill #0

## 2018-01-27 MED ORDER — DORNASE ALFA 1 MG/ML SOLUTION FOR INHALATION: mL | 11 refills | 0 days

## 2018-01-27 MED ORDER — DORNASE ALFA 1 MG/ML SOLUTION FOR INHALATION
Freq: Two times a day (BID) | RESPIRATORY_TRACT | 11 refills | 0.00000 days | Status: CP
Start: 2018-01-27 — End: 2018-04-17

## 2018-01-27 MED FILL — CETIRIZINE HCL/10MG/TABS: CETIRIZINE HCL/10MG/TABS | 90 days supply | Qty: 90 | Fill #0

## 2018-01-27 MED FILL — VITAMIN D-3/1000 U/: VITAMIN D-3/1000 U/ | 100 days supply | Qty: 100 | Fill #2

## 2018-01-27 NOTE — Unmapped (Signed)
[facility] Refill Coordination Note    Specialty Medication(s) to be Shipped:   CF/Pulmonary: -Symdeko 100/150mg  and 150mg   -Pulmozyme 2.5mg /2.80ml  -Creon 36000U    Other medication(s) to be shipped: CETIRIZINE 10MG   VITAMIN D 1000 U     Bubba Camp, DOB: 03/18/1986  Phone: [phone] (home)   Shipping Address: [address]  [place] [address]    All above HIPAA information was verified with patient's family member.     Completed refill call assessment today to schedule patient's medication shipment from the [facility] [phone]).       Specialty medication(s) and dose(s) confirmed: Regimen is correct and unchanged.   Changes to medications: Moshe reports no changes reported at this time.  Changes to insurance: Yes: Patient no longer has [employer]. Only [employer] and [employer] cards, they are working on getting new insurance.  Questions for the pharmacist: No    The patient will receive an FSI print out for each medication shipped and additional FDA Medication Guides as required.  Patient education from [facility] or Robet Leu may also be included in the shipment.    DISEASE-SPECIFIC INFORMATION        For CF patients: CF [employer] [facility] Active? Yes    ADHERENCE     Medication Adherence    Patient reported X missed doses in the last month:  2  Specialty Medication:  CREON 36000U #OH 2 WEEKS  Patient is on additional specialty medications:  Yes  Additional Specialty Medications:  PULMOZYME #-2 DAYS  Patient Reported Additional Medication X Missed Doses in the Last Month:  0  Patient is on more than two specialty medications:  Yes  Specialty Medication:  SYMDEKO 100-150 #OH-1 WEEK  Patient Reported Additional Medication X Missed Doses in the Last Month:  0  Specialty Medication:  ARIKAYCE #OH-1 MONTH  Patient Reported Additional Medication X Missed Doses in the Last Month:  0  Demonstrates understanding of importance of adherence:  yes  Informant:  spouse Reliability of informant:  reliable  Reasons for non-adherence:  patient has problems affording medications  Support network for adherence:  family member  Confirmed plan for next specialty medication refill:  delivery by pharmacy  Refills needed for supportive medications:  yes, ordered or provider notified          Refill Coordination    Has the Patients' Contact Information Changed:  No  Is the Shipping Address Different:  No         MEDICARE PART B DOCUMENTATION     Not Applicable    SHIPPING     Shipping address confirmed in FSI.     Delivery Scheduled: Yes, Expected medication delivery date: 01/28/18.  However, Rx request for refills was sent to the provider as there are none remaining.     Lajean Silvius   [facility] Specialty Technician

## 2018-02-03 NOTE — Unmapped (Signed)
[facility] Refill Coordination Note    Specialty Medication(s) to be Shipped:   -NEEDED THE FOLLOWING FROM THE OTHER NOTE, BUT WORKING ON PT ASST. WAS ABLE TO SEND OUT VIT D 1000U         Christian Young, DOB: Dec 20, 1985  Phone: [phone] (home)   Shipping Address: [address]  [place] [address]    All above HIPAA information was verified with patient.     Completed refill call assessment today to schedule patient's medication shipment from the [facility] [phone]).       Specialty medication(s) and dose(s) confirmed: Regimen is correct and unchanged.   Changes to medications: Kayin reports no changes reported at this time.  Changes to insurance: Yes: NONE AT THIS TIME. WIFE HOPEFULLY WILL BE ELIGIBLE END OF JULY.  Questions for the pharmacist: No    The patient will receive an FSI print out for each medication shipped and additional FDA Medication Guides as required.  Patient education from [facility] or Robet Leu may also be included in the shipment.    DISEASE-SPECIFIC INFORMATION        For CF patients: CF [employer] Active? Yes    ADHERENCE     Medication Adherence    Patient reported X missed doses in the last month:  2  Specialty Medication:  CREON 36000U #OH 2 WEEKS  Patient is on additional specialty medications:  Yes  Additional Specialty Medications:  PULMOZYME #-2 DAYS  Patient Reported Additional Medication X Missed Doses in the Last Month:  0  Patient is on more than two specialty medications:  Yes  Specialty Medication:  SYMDEKO 100-150 #OH-1 WEEK  Patient Reported Additional Medication X Missed Doses in the Last Month:  0  Specialty Medication:  ARIKAYCE #OH-1 MONTH  Patient Reported Additional Medication X Missed Doses in the Last Month:  0  Demonstrates understanding of importance of adherence:  yes  Informant:  spouse  Reliability of informant:  reliable  Reasons for non-adherence:  patient has problems affording medications Support network for adherence:  family member  Confirmed plan for next specialty medication refill:  delivery by pharmacy  Refills needed for supportive medications:  yes, ordered or provider notified          Refill Coordination    Has the Patients' Contact Information Changed:  No  Is the Shipping Address Different:  No         MEDICARE PART B DOCUMENTATION     Not Applicable    SHIPPING     Shipping address confirmed in FSI.     Delivery Scheduled: Yes, Expected medication delivery date: 01/28/18 via UPS or courier.     Lajean Silvius   [facility] Specialty Technician

## 2018-02-17 NOTE — Unmapped (Signed)
CF SW received update from clinic pharmacist that Pharmacy Assistance Technician unable to reach [facility] after multiple attempts to renew his MAP for Symdeko. SW reached out to Ross today and left a voicemail. SW also aware, per last conversation, that [name] hopefully will be eligible for health insurance through his wife's employer as of today (July 8).     SW will continue to remain available.    Jerl Mina, LCSW  February 17, 2018 1:41 PM

## 2018-02-27 ENCOUNTER — Other Ambulatory Visit: Payer: Self-pay

## 2018-02-27 ENCOUNTER — Encounter (HOSPITAL_COMMUNITY): Payer: Self-pay | Admitting: Emergency Medicine

## 2018-02-27 ENCOUNTER — Emergency Department (HOSPITAL_COMMUNITY)
Admission: EM | Admit: 2018-02-27 | Discharge: 2018-02-27 | Disposition: A | Discharge status: Home / Self Care | Payer: Self-pay | Attending: Emergency Medicine | Admitting: Emergency Medicine

## 2018-02-27 ENCOUNTER — Emergency Department (HOSPITAL_COMMUNITY): Payer: Self-pay

## 2018-02-27 DIAGNOSIS — Z79899 Other long term (current) drug therapy: Secondary | ICD-10-CM | POA: Insufficient documentation

## 2018-02-27 DIAGNOSIS — R05 Cough: Secondary | ICD-10-CM | POA: Insufficient documentation

## 2018-02-27 DIAGNOSIS — E1165 Type 2 diabetes mellitus with hyperglycemia: Secondary | ICD-10-CM | POA: Insufficient documentation

## 2018-02-27 DIAGNOSIS — R059 Cough, unspecified: Secondary | ICD-10-CM

## 2018-02-27 DIAGNOSIS — I10 Essential (primary) hypertension: Secondary | ICD-10-CM | POA: Insufficient documentation

## 2018-02-27 DIAGNOSIS — R739 Hyperglycemia, unspecified: Secondary | ICD-10-CM

## 2018-02-27 LAB — CBC WITH DIFFERENTIAL/PLATELET
Abs Immature Granulocytes: 0.1 10*3/uL (ref 0.0–0.1)
BASOS ABS: 0.1 10*3/uL (ref 0.0–0.1)
BASOS PCT: 1 %
EOS ABS: 0 10*3/uL (ref 0.0–0.7)
EOS PCT: 0 %
HCT: 46.8 % (ref 39.0–52.0)
Hemoglobin: 14.8 g/dL (ref 13.0–17.0)
IMMATURE GRANULOCYTES: 1 %
LYMPHS ABS: 2.1 10*3/uL (ref 0.7–4.0)
Lymphocytes Relative: 26 %
MCH: 25.3 pg — ABNORMAL LOW (ref 26.0–34.0)
MCHC: 31.6 g/dL (ref 30.0–36.0)
MCV: 79.9 fL (ref 78.0–100.0)
Monocytes Absolute: 0.8 10*3/uL (ref 0.1–1.0)
Monocytes Relative: 11 %
NEUTROS PCT: 61 %
Neutro Abs: 4.9 10*3/uL (ref 1.7–7.7)
Platelets: 326 10*3/uL (ref 150–400)
RBC: 5.86 MIL/uL — AB (ref 4.22–5.81)
RDW: 15.9 % — AB (ref 11.5–15.5)
WBC: 8 10*3/uL (ref 4.0–10.5)

## 2018-02-27 LAB — CBG MONITORING, ED
Glucose-Capillary: 338 mg/dL — ABNORMAL HIGH (ref 70–99)
Glucose-Capillary: 237 mg/dL — ABNORMAL HIGH (ref 70–99)

## 2018-02-27 LAB — URINALYSIS, ROUTINE W REFLEX MICROSCOPIC
BILIRUBIN URINE: NEGATIVE
Glucose, UA: >=500 mg/dL — AB
HGB URINE DIPSTICK: NEGATIVE
Ketones, ur: NEGATIVE mg/dL
LEUKOCYTES UA: NEGATIVE
NITRITE: NEGATIVE
PH: 5 (ref 5.0–8.0)
Protein, ur: NEGATIVE mg/dL
SPECIFIC GRAVITY, URINE: 1.027 (ref 1.005–1.030)

## 2018-02-27 LAB — BASIC METABOLIC PANEL
ANION GAP: 13 (ref 5–15)
BUN: 21 mg/dL — ABNORMAL HIGH (ref 6–20)
CALCIUM: 10 mg/dL (ref 8.9–10.3)
CO2: 23 mmol/L (ref 22–32)
CREATININE: 1 mg/dL (ref 0.61–1.24)
Chloride: 97 mmol/L — ABNORMAL LOW (ref 98–111)
Glucose, Bld: 328 mg/dL — ABNORMAL HIGH (ref 70–99)
Potassium: 4.1 mmol/L (ref 3.5–5.1)
Sodium: 133 mmol/L — ABNORMAL LOW (ref 135–145)

## 2018-02-27 MED ORDER — HYDROCODONE-ACETAMINOPHEN 5-325 MG PO TABS
1.0000 | ORAL_TABLET | Freq: Once | ORAL | Status: AC
  Administered 2018-02-27: 1 via ORAL
  Filled 2018-02-27: qty 1

## 2018-02-27 MED ORDER — LEVOFLOXACIN 750 MG PO TABS: 750.0000 mg | ORAL_TABLET | Freq: Every day | ORAL | 0 refills | Status: DC

## 2018-02-27 MED ORDER — HYDROCOD POLST-CPM POLST ER 10-8 MG/5ML PO SUER: 5.0000 mL | Freq: Two times a day (BID) | ORAL | 0 refills | Status: DC | PRN

## 2018-02-27 MED ORDER — LEVOFLOXACIN 750 MG PO TABS
750.0000 mg | ORAL_TABLET | Freq: Once | ORAL | Status: AC
  Administered 2018-02-27: 750 mg via ORAL
  Filled 2018-02-27: qty 1

## 2018-02-27 MED ORDER — IPRATROPIUM-ALBUTEROL 0.5-2.5 (3) MG/3ML IN SOLN
3.0000 mL | Freq: Once | RESPIRATORY_TRACT | Status: AC
  Administered 2018-02-27: 3 mL via RESPIRATORY_TRACT

## 2018-02-27 NOTE — Unmapped (Signed)
Christian Young left voicemail and called the pulmonary clinic to state he was going to [facility]. In voicemail he states he might need to be admitted. States he woke up with chest tightness and other symptoms (unable to determine due to poor quality of voicemail).     When speaking with [facility] states he is having to go to [facility])  as it is closer for his wife and him to get to from their home and gas money is tight at the moment.     Encouraged [facility] to have his providers review documentation through Care Everywhere and if it is after 4:30pm, they will need to page the pulmonary fellow on-call for further advice.    [name], patient's new CF provider.     [name], RN  CF Nurse Coordinator   [phone]

## 2018-02-27 NOTE — ED Provider Notes (Signed)
Patient placed in Quick Look pathway, seen and evaluated   Chief Complaint: cough  HPI:   James Proctor is a 32 y.o. male with hx of Cystic Fibrosis who presents to the ED with a productive cough that started a week ago and has gotten worse with feeling short of breath and pain with coughing. Patient used his inhaler with some relief from the wheezing. He also has used OTC medication. Patient reports that his CBG this morning before eating was 503. Patient rates his pain across his chest as 9/10 with cough.   ROS: Resp: cough, shortness of breath    Physical Exam:  BP (!) 171/106 (BP Location: Right Arm)   Pulse (!) 113   Temp 98.4 F (36.9 C) (Oral)   Resp 20   Ht 6' (1.829 m)   Wt 117.9 kg (260 lb)   SpO2 97%   BMI 35.26 kg/m    Gen: No distress  Neuro: Awake and Alert  Heart: tachycardic  Lungs: decreased breath sounds, no wheezing heard     Initiation of care has begun. The patient has been counseled on the process, plan, and necessity for staying for the completion/evaluation, and the remainder of the medical screening examination    Janne Napoleoneese, Jazelle Achey M, NP 02/28/18 1839    Gerhard MunchLockwood, Robert, MD 03/03/18 [phone]

## 2018-02-27 NOTE — Discharge Instructions (Signed)
Please read and follow all provided instructions.  Your diagnoses today include:  1. Cough   2. Cystic fibrosis (HCC)   3. Hyperglycemia     Tests performed today include:  Chest x-ray - does not show any pneumonia  Blood counts and electrolytes  Vital signs. See below for your results today.   Medications prescribed:   Levaquin -antibiotic for respiratory infection   Tussinex - narcotic cough suppressant syrup  You have been prescribed narcotic cough suppressant such as Tussinex: DO NOT drive or perform any activities that require you to be awake and alert because this medicine can make you drowsy.   Take any prescribed medications only as directed.  Home care instructions:  Follow any educational materials contained in this packet.  Follow-up instructions: Please follow-up with your lung specialist in the next 2 days for further evaluation of your symptoms and a recheck if you are not feeling better.   Return instructions:   Please return to the Emergency Department if you experience worsening symptoms.  Please return with worsening wheezing, shortness of breath, or difficulty breathing.  Return with persistent fever above 101F.   Please return if you have any other emergent concerns.  Additional Information:  Your vital signs today were: BP (!) 155/100 (BP Location: Right Arm)    Pulse (!) 109    Temp 98.4 F (36.9 C) (Oral)    Resp 18    Ht 6' (1.829 m)    Wt 117.9 kg (260 lb)    SpO2 100%    BMI 35.26 kg/m  If your blood pressure (BP) was elevated above 135/85 this visit, please have this repeated by your doctor within one month. --------------

## 2018-02-27 NOTE — ED Triage Notes (Signed)
Pt reports cough over the last week with increased sputum, chest pain d/t coughing with some sob. Pt reports use of home inhaler with some relief from wheezing and OTC medications. Pt reports waking up this am and his CBG was 503 before breakfast. Pain 9/10 across the chest. Hx of cystic fibrosis and HTN.

## 2018-02-27 NOTE — ED Provider Notes (Signed)
[facility]   CSN: [id number] Arrival date & time: 02/27/18  1539     History   Chief Complaint Chief Complaint  Patient presents with  . Cough  . Hyperglycemia    HPI James Proctor is a 32 y.o. male.  Patient with history of cystic fibrosis, diabetes, hypertension --presents to the emergency department with cough and wheezing over the past week or so.  He has had some increased production of sputum that is green.  No fevers.  Symptoms started as nasal congestion and then "moved to my chest".  He has been using albuterol inhaler at home for wheezing.  He states that his blood sugars have been running high and this is a red flag when it comes to infection.  He feels that he requires oral antibiotics but does not feel that he requires IV antibiotics at this point.  He states that he is able to do his daily activities but doing things like washing dishes and brushing his teeth "takes it out of me".  The onset of this condition was acute. The course is constant. Aggravating factors: activity. Alleviating factors: none.       Past Medical History:  Diagnosis Date  . Cystic fibrosis (HCC)   . Hypertension     Patient Active Problem List   Diagnosis Date Noted  . Bronchiectasis (HCC) 11/06/2016  . 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 Pain   . Cystic fibrosis (HCC)   . Pleuritic chest pain 11/05/2016  . Cystic fibrosis exacerbation (HCC) 07/29/2016  . Pain management 07/29/2016  . Hyperglycemia 07/29/2016    Past Surgical History:  Procedure Laterality Date  . LOBECTOMY Right 03/2017   partial        Home Medications    Prior to Admission medications   Medication Sig Start Date End Date Taking? Authorizing Provider  albuterol (PROVENTIL HFA;VENTOLIN HFA) 108 (90 Base) MCG/ACT inhaler Inhale 1-2 puffs into the lungs every 6 (six) hours as needed for wheezing or shortness of breath.    [provider]  albuterol (PROVENTIL) (2.5 MG/3ML) 0.083% nebulizer solution Take 2.5 mg by nebulization 2 (two) times daily.    [provider]  azithromycin (ZITHROMAX) 500 MG tablet Take 500 mg by mouth daily. Continuous    [provider]  B Complex-C-Folic Acid (RENAL SOFTGELS PO) Take 1 tablet by mouth 2 (two) times daily. With D5000    [provider]  budesonide-formoterol (SYMBICORT) 160-4.5 MCG/ACT inhaler Inhale 2 puffs into the lungs 2 (two) times daily. 10/06/15   [provider]  cetirizine (ZYRTEC) 10 MG tablet Take 10 mg by mouth daily.    [provider]  CLOFAZIMINE PO Take 100 mg by mouth daily with lunch. Clofazimine 50 mg capsule (INVESTIGATIONAL STUDY DRUG) - dose is 100mg /2 capsules Take with food or milk    [provider]  dornase alpha (PULMOZYME) 1 MG/ML nebulizer solution Inhale 2.5 mg into the lungs daily. 11/29/15   [provider]  lisinopril (PRINIVIL,ZESTRIL) 5 MG tablet Take 5 mg by mouth daily. 11/29/15 11/28/16  [provider]  metoprolol succinate (TOPROL-XL) 25 MG 24 hr tablet Take 25 mg by mouth 2 (two) times daily.    [provider]  montelukast (SINGULAIR) 10 MG tablet Take 10 mg by mouth daily. 04/10/16 04/05/17  [provider]  omeprazole (PRILOSEC) 20 MG capsule Take 20 mg by mouth daily. 06/19/16   [provider]  Pancrelipase, Lip-Prot-Amyl,  40000 units CPEP Take 3-6 capsules by mouth See admin instructions. 6 capsules three times a day with meals and 3 capsules with each snack 04/02/16   [provider]  PARoxetine (PAXIL) 10 MG tablet Take 20 mg by mouth daily.  04/10/16 04/05/17  [provider]  pramipexole (MIRAPEX) 0.125 MG tablet Take 0.25 mg by mouth at bedtime.  08/22/15   [provider]  pregabalin (LYRICA) 100 MG capsule Take 100 mg by mouth 2 (two) times daily.    [provider]  Probiotic Product (PROBIOTIC PO)  Take 1 tablet by mouth daily.    [provider]  Sodium Chloride, Inhalant, 7 % NEBU Take 4 mLs by nebulization 2 (two) times daily.  06/21/16   [provider]  Sodium Chloride-Sodium Bicarb 2300-700 MG PACK Place 1 packet into the nose 2 (two) times daily.    [provider]  Tezacaftor-Ivacaftor&Ivacaftor (SYMDEKO) 100-150 & 150 MG TBPK Take 1 tablet by mouth 2 (two) times daily.    [provider]  traMADol (ULTRAM) 50 MG tablet Take 50 mg by mouth 3 (three) times daily as needed for moderate pain.    [provider]  traZODone (DESYREL) 100 MG tablet Take 100 mg by mouth every evening. 08/04/15   [provider]  XARELTO 20 MG TABS tablet Take 20 mg by mouth daily. 07/06/16   [provider]    Family History Family History  Problem Relation Age of Onset  . Cystic fibrosis Neg Hx     Social History Social History   Tobacco Use  . Smoking status: Never Smoker  . Smokeless tobacco: Never Used  Substance Use Topics  . Alcohol use: Yes    Comment: socially  . Drug use: Not on file     Allergies   C[place]ston [aztreonam]; Cefepime; Tobramycin; Banana; and Theophyllines   Review of Systems Review of Systems  Constitutional: Negative for chills and fever.  HENT: Positive for congestion. Negative for rhinorrhea and sore throat.   Eyes: Negative for redness.  Respiratory: Positive for cough.   Cardiovascular: Negative for chest pain and leg swelling.  Gastrointestinal: Negative for abdominal pain, diarrhea, nausea and vomiting.  Genitourinary: Negative for dysuria.  Musculoskeletal: Negative for myalgias.  Skin: Negative for rash.  Neurological: Negative for headaches.     Physical Exam Updated Vital Signs BP (!) 139/93 (BP Location: Right Arm)   Pulse (!) 106   Temp 98.4 F (36.9 C) (Oral)   Resp 16   Ht 6' (1.829 m)   Wt 117.9 kg (260 lb)   SpO2 100%   BMI 35.26 kg/m   Physical Exam  Constitutional:  He appears well-developed and well-nourished.  HENT:  Head: Normocephalic and atraumatic.  Mouth/Throat: Oropharynx is clear and moist.  Eyes: Conjunctivae are normal. Right eye exhibits no discharge. Left eye exhibits no discharge.  Neck: Normal range of motion. Neck supple.  Cardiovascular: Normal rate, regular rhythm and normal heart sounds.  No murmur heard. Heart rate 100  Pulmonary/Chest: Effort normal. No stridor. No respiratory distress. He has wheezes (Minimal). He has no rales.  Abdominal: Soft. There is no tenderness. There is no rebound and no guarding.  Musculoskeletal: He exhibits no edema.  Neurological: He is alert.  Skin: Skin is warm and dry.  Psychiatric: He has a normal mood and affect.  Nursing note and vitals reviewed.    ED Treatments / Results  Labs (all labs ordered are listed, but only abnormal results are  displayed) Labs Reviewed  CBC WITH DIFFERENTIAL/PLATELET - Abnormal; Notable for the following components:      Result Value   RBC 5.86 (*)    MCH 25.3 (*)    RDW 15.9 (*)    All other components within normal limits  BASIC METABOLIC PANEL - Abnormal; Notable for the following components:   Sodium 133 (*)    Chloride 97 (*)    Glucose, Bld 328 (*)    BUN 21 (*)    All other components within normal limits  URINALYSIS, ROUTINE W REFLEX MICROSCOPIC - Abnormal; Notable for the following components:   APPearance HAZY (*)    Glucose, UA >=500 (*)    Bacteria, UA RARE (*)    All other components within normal limits  CBG MONITORING, ED - Abnormal; Notable for the following components:   Glucose-Capillary 338 (*)    All other components within normal limits  CBG MONITORING, ED    EKG None  Radiology Dg Chest 2 View  Result Date: 02/27/2018 CLINICAL DATA:  Fever and cough.  History of cystic fibrosis. EXAM: CHEST - 2 VIEW COMPARISON:  Chest x-ray dated November 07, 2016. FINDINGS: Unchanged right chest wall port catheter with tip in the right  atrium. The heart size and mediastinal contours are within normal limits. Normal pulmonary vascularity. Unchanged right upper lobe bronchiectasis and scarring. New volume loss in the right lung related to interval partial right lobectomy. No focal consolidation, pleural effusion, or pneumothorax. No acute osseous abnormality. IMPRESSION: 1.  No active cardiopulmonary disease. 2. Stable scarring and bronchiectasis in the right upper lobe, consistent with history of cystic fibrosis. Electronically Signed   By: Obie Dredge M.D.   On: 02/27/2018 17:43    Procedures Procedures (including critical care time)  Medications Ordered in ED Medications  levofloxacin (LEVAQUIN) tablet 750 mg (has no administration in time range)  HYDROcodone-acetaminophen (NORCO/VICODIN) 5-325 MG per tablet 1 tablet (1 tablet Oral Given 02/27/18 1633)  ipratropium-albuterol (DUONEB) 0.5-2.5 (3) MG/3ML nebulizer solution 3 mL (3 mLs Nebulization Given 02/27/18 1611)     Initial Impression / Assessment and Plan / ED Course  I have reviewed the triage vital signs and the nursing notes.  Pertinent labs & imaging results that were available during my care of the patient were reviewed by me and considered in my medical decision making (see chart for details).     Patient seen and examined.  Work-up reviewed.  Chest x-ray reviewed.  Vital signs reviewed and are as follows: BP (!) 139/93 (BP Location: Right Arm)   Pulse (!) 106   Temp 98.4 F (36.9 C) (Oral)   Resp 16   Ht 6' (1.829 m)   Wt 117.9 kg (260 lb)   SpO2 100%   BMI 35.26 kg/m   Patient appears to be doing well.  He is in no respiratory distress.  He does not wish to be admitted at this point.  He states that medications like Levaquin it worked well for him in the past.  He also requests medication for cough.  He is willing to follow-up with his specialist for further instructions tomorrow.  He is comfortable with discharged home as well as his parents.   Will recheck CBG, vital signs, give first dose of Levaquin.  Home with Levaquin and cough medication.  Patient counseled on use of narcotic pain medications. Counseled not to combine these medications with others containing tylenol. Urged not to drink alcohol, drive, or perform any other activities that  requires focus while taking these medications. The patient verbalizes understanding and agrees with the plan.   Final Clinical Impressions(s) / ED Diagnoses   Final diagnoses:  Cough  Cystic fibrosis (HCC)  Hyperglycemia   Patient with increasing cough and setting of CF.  Also hyperglycemia without signs of DKA.  Will treat patient's exacerbation.  Chest x-ray is clear.  No hypoxia.  Mild tachycardia noted but patient is in no distress.  No fevers.  He has appropriate follow-up at [facility].  He seems to know his condition well and feels that he does not require admission tonight.  I am comfortable with this plan.  ED Discharge Orders    None       Renne Crigler, Cordelia Poche 02/27/18 Valrie Hart, MD 02/27/18 2242

## 2018-02-27 NOTE — ED Notes (Signed)
CBG 338. Not crossing over.

## 2018-02-27 NOTE — ED Notes (Signed)
ED Provider at bedside. 

## 2018-03-04 MED ORDER — CIPROFLOXACIN 750 MG TABLET
ORAL_TABLET | Freq: Two times a day (BID) | ORAL | 0 refills | 0 days | Status: SS
Start: 2018-03-04 — End: 2018-04-07

## 2018-03-04 NOTE — Unmapped (Signed)
Spoke with [facility] about his recent visit to the ED at [facility]. Presented there with     cough and wheezing over the past week or so. He has had some increased production of sputum that is green. No fevers. Symptoms started as nasal congestion and then moved to my chest. He has been using albuterol inhaler at home for wheezing. He states that his blood sugars have been running high and this is a red flag when it comes to infection. He feels that he requires oral antibiotics but does not feel that he requires IV antibiotics at this point. He states that he is able to do his daily activities but doing things like washing dishes and brushing his teeth takes it out of me. The onset of this condition was acute. The course is constant. Aggravating factors: activity. Alleviating factors: none.     [name] was sent home with levofloxacin x 7 days and hydrocodone-chlorpheniramine polistirex [facility]). [facility] was also instructed to follow-up with pulmonary.      Christian Young states he still feels kind of gross. Notes he is still out of breath after a shower, states he sounds like he ran a 5K. States the Levaquin was very expensive and only able to afford after applying a GoodRx coupon. Requesting an additional week of therapy to be Ciprofloxacin 750mg  and it routed to a local [facility], as this was the least expensive option for him.     [facility] Young reports being compliant with his Arikayce (amikacin inhalation).     Reminded both patient and wife of his appointment on August 5th.     Note routed to [facility], for advice on a second week of therapy.     [name], RN  CF Nurse Coordinator   [phone]

## 2018-03-14 NOTE — Unmapped (Signed)
Christian Young called the [facility] nurse coordinator line. States there is an issue with Christian Young's insurance. Asking about Christian apportionment on Monday and also wanting to know more about how charity care works.     Patient states he would love to be seen and needs to be seen on Monday.   Returned Christian Young's call. Encouraged to him attend Christian appointment on Monday. Louretta Shorten, SW, will also be reaching out to him to further discuss charity care.    [name], RN  CF Nurse Coordinator   [phone]

## 2018-03-14 NOTE — Unmapped (Signed)
CF SW received Epic message from [facility], stating that Christian Young called to discuss his insurance lapse and upcoming clinic appointment (Mon, 8/5). CF SW spoke with Christian Young and his wife, Christian Young, by phone today.    Per Christian Young, his wife's employer-based insurance won't be active until Sept 1.    SW provided overview of [facility] and PAP and emailed copies today. This SW had previously provided copies to [place] while he was hospitalized in May.     [name] plans to complete both applications and bring supporting documents to clinic visit on Monday. SW will plan to meet with him at that time, and also informed him of presence of clinic financial coordinator, Gwyneth Sprout, for additional questions & for processing his applications if complete.    SW also inquired about [name]'s current medication supply. He states that he has not missed or skipped doses of Symdeko or HTS and has been doing airway clearance.     [name] reports running low on his Lamictal, Carvedilol, and Creon. He denies missing/skipping any doses thus far. Of concern, he shared that he's been out of his Vibryd for weeks now. States that he did not taper his dose but stopped it cold [place].    SW assessed his mood and coping, and inquired about suicidal ideation.    Christian Young denies suicidal thoughts (passive or active), intent, plan. He reports that his PCP has prescribed his Vibryd and Lamictal but won't continue while he is uninsured due to [facility] cost. He has continued taking Lamictal, purchasing it off [employer]'s $4 formulary. He denies altering / increasing his dose. Christian Young believes that having the Lamictal has helped keep his depression symptoms manageable, though he endorses an understandable increase in anxiety symptoms (anxiety through the roof). He has been coping by coloring, drawing, and playing video games.    CF SW will meet with Christian Young and his wife Christian Young for further conversation and support during [name]'s clinic appointment on Monday.     Epic communication routed to [employer] Team Film/video editor, Teacher, early years/pre, Dietician, Pulmonologist/ Dr. [name]) for further planning.    Jerl Mina, LCSW  March 14, 2018 12:18 PM

## 2018-03-17 ENCOUNTER — Ambulatory Visit: Admit: 2018-03-17 | Discharge: 2018-03-17

## 2018-03-17 MED ORDER — VILAZODONE 40 MG TABLET
ORAL_TABLET | Freq: Every day | ORAL | 0 refills | 0.00000 days | Status: CP
Start: 2018-03-17 — End: 2018-10-01

## 2018-03-17 NOTE — Unmapped (Signed)
You were seen in clinic today by Dr [name] and Dr Lurena Nida  We will call you with results if any labs or imaging ordered.  Please call the clinic if you have any further questions or call 911 for emergency.  Thank you for giving me the opportunity to manage your health.    Between appointments, you can reach [name] at these numbers:    For appointments or the Pulmonary Nurse: [phone]  Fax: [phone]    For urgent issues after hours:  Hospital Operator: [phone], ask for Pulmonary Fellow on call

## 2018-03-17 NOTE — Unmapped (Signed)
Sputum Induction    Date: 8 /5 /2019    Ordering Physician: Lurena Nida    Nebulizer Type: Ultrasonic    Bronchodilator Administration    3 Puffs (1 min apart) of bronchodilator given at: 13:45    Pre-Sputum Induction Baseline Peak Flow (Post bronchodilator) 340 and 350    Baseline Peak Flow (average of 2 measures)          345    L/min     Calculations For Safety Monitoring    90% of Pre-Sputum Induction                    Baseline Peak Flow:             310  L/min    80% of Pre-Sputum Induction:                        Baseline Peak Flow:             275  L/min    Sputum Induction-Main Procedure:    Induction Cycles:                 Time:      14:02 Peak Flow L/min: 345    (approx.3 min. Intervals)             Start time:  14:05                                                                              End 1st cycle:    14:08  420 L                                                                           End 2nd cycle:    14:11  360 L                                                                       End 3rd cycle:     14:16  produced sputum sample                                                                         End 4th cycle:  End 5th cycle:                                                               Stop time:  14: 17               Comments (optional): Informed RN of sample. Patient in no distress.

## 2018-03-17 NOTE — Unmapped (Signed)
Sputum collected for AFB and CF culture and taken to lab.

## 2018-03-17 NOTE — Unmapped (Signed)
[facility]      [facility]     Christian Young is a 32 y.o. male followed by [facility] for his CF. Christian Young was accompanied to clinic today by his wife, Christian Young. CF SW met with Christian Young and Christian Young as f/u to conversation on Friday re: insurance lapse and access to CF medications. Annual MH/SA screenings also offered today.    INSURANCE LAPSE: Christian Young will be eligible for employer based insurance through his wife on Sept 1. Christian Young and Christian Young brought completed Christian Young and PAP applications today and requested SW help in submitting.    FOOD INSECURITY: Endorse ongoing financial challenges/ food insecurity. SW provided groceries today.    MENTAL HEALTH:    Pt self administered the PHQ-9 for depression and GAD-7 for anxiety. Pt scored a 23 on PHQ-9 indicating severe depression and she scored a 21 on the GAD-7 indicating severe anxiety.     Christian Young endorsed thoughts he would be better off dead more than half the days in the last two weeks. He endorsed having fleeting thoughts that he is a burden on his wife due to his CF. Reports connection between his CF and his depression/ anxiety. His wife shared that Christian Young was diagnosed with CF as an adult - at age 49 - and is still adjusting. Christian Young denies active suicidal thoughts, intention, or plan. He states, I'd never do that. Wife Christian Young present for conversation and confirmed that they have a safety plan should his thoughts escalate, particularly considering he has been w/o his Vibryd for the past few weeks and did not taper off this medication. Christian Young denies past SI requiring emergent care or hospitalization.    Christian Young shared that having CF has made it difficult for him to work, as he normally doesn't tell his employer he's sick and then works himself too hard. He has been connected to a local therapist, Christian Young, who he has seen twice a month for the past two months. She is unable to see him while he is uninsured. He does not have a current psychiatrist.     See below for further plan.     SUBSTANCE USE:    Patient self-administered the AUDIT for alcohol use and the DAST-10 for drug abuse. Patient scored a 3 on the AUDIT, indicating low risk use.  Patient scored a 0 on the DAST-10, indicating no drug use.     No substance use intervention needed at this time.    Plan:     Per pt's request, SW faxed completed PAP application to [phone].    Per pt's request, SW will also assist with submitting pt's completed [facility] application. SW will bring it to a Artist tomorrow for review, as pulmonary financial counselor not available in clinic today.    SW provided Christian Young with contact information for [place] [facility]/ [facility] [phone]) to discuss potential accessibility of MH medications (Vibryd, in particular) while uninsured. Christian Young also completed a MAP application with CF Pharmacist.    CF SW provided groceries from clinic food pantry today.  Number of people served: 2     SW will f/u with Christian Young by phone in one week.

## 2018-03-18 NOTE — Unmapped (Signed)
I saw and evaluated the patient, participating in the key portions of the service.  I reviewed the resident???s note.  I agree with the resident???s findings and plan. Sarah Zerby H Carmell Elgin, MD

## 2018-03-18 NOTE — Unmapped (Signed)
Pulmonary Clinic - Follow-up Visit      HISTORY:     Interval History:  Diagnosed at 85 with chronic infections, had consolidation and parapneumonic effusion,sent to [facility] for treatment since then.   [id number] and [id number] insertion. 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    Patient mentions that he is doing well.  He is accompanied by his wife.  They have lapsed insurance for a while, which is going to get active again on September 1 as his wife has got a new job.  However because of that, he is out of most of his medications, mainly his antianxiety and as antidepression medications.  These include Viibryd, Lamictal, risperidone, trazodone, and Lyrica.  Due to lack of these medications, he feels that it is getting overwhelming, and he is not able to take care of himself.  However, he mentions that he has been compliant with his Pro Air, 7% hypertonic saline, and Pulmozyme, amikacin nebs, Creon, and his nontuberculous mycobacterial treatment including azithromycin and clofazimine.  However on repeated questioning, he mentions that he does forget them occasionally as he feels sad.  He reports 100% compliance to Teza/Iva.     He recently had to visit the ER and was on call, as he was feeling sick and felt that he was coming up with a flare from infection, may need for respiratory.  He was prescribed levofloxacin for a week, and ciprofloxacin for another week, after which his symptoms resolved.    Currently, he complains of pressure and congestion of his sinuses, ear pain, some headaches, no fevers, runny nose however no postnasal drip.  His cough is not too bad, without any expectoration or chest tightness.  He continues to use encourage vest however does not use it every day.  Currently denies having any abdominal pain and mentions that he has normal bowel movements with the MiraLAX that he uses every day.  However does feel bloated.  He continues to use the Xarelto, which was used for provoked and unprovoked DVT and was told that he is to be using it all his life.  Denies any bleeding or bruises.    Barely exercises at this stage.    His insulin for his diabetes, which is ranging between 150-200, with no blood sugar readings less than 100.      Past Medical History:   Diagnosis Date   ??? Anxiety    ??? Chronic pain disorder    ??? Cystic fibrosis (CMS-HCC)    ??? Depression    ??? Hypertension      Past Surgical History:   Procedure Laterality Date   ??? PR REMOVAL OF LUNG,LOBECTOMY Right 03/29/2017    Procedure: REMOVAL OF LUNG, OTHER THAN PNEUMONECTOMY; SINGLE LOBE (LOBECTOMY);  Surgeon: Cherie Dark, MD;  Location: MAIN OR [facility];  Service: Thoracic       Other History:  The social history and family history were personally reviewed and updated in the patient's electronic medical record.     Home Medications:  Current Outpatient Medications on File Prior to Visit   Medication Sig Dispense Refill   ??? albuterol (PROVENTIL HFA;VENTOLIN HFA) 90 mcg/actuation inhaler Inhale 2 puffs every six (6) hours as needed. 1 Inhaler 1   ??? albuterol 2.5 mg/0.5 mL nebulizer solution Inhale 0.5 mL (2.5 mg total) by nebulization every six (6) hours as needed for wheezing. 90 each 3   ??? azithromycin (ZITHROMAX) 250 MG tablet Take 250 mg by mouth daily.     ???  budesonide-formoterol (SYMBICORT) 160-4.5 mcg/actuation inhaler Inhale 2 puffs Two (2) times a day.     ??? carvedilol (COREG) 3.125 MG tablet Take 1 tablet (3.125 mg total) by mouth Two (2) times a day. 180 tablet 3   ??? ciprofloxacin HCl (CIPRO) 750 MG tablet Take 1 tablet (750 mg total) by mouth Two (2) times a day. for 7 days 14 tablet 0   ??? CLOFAZIMINE ORAL Take 200 mg by mouth daily.      ??? fluticasone (FLONASE) 50 mcg/actuation nasal spray 2 sprays by Each Nare route daily. 16 g 0   ??? insulin ASPART (NOVOLOG FLEXPEN) 100 unit/mL injection pen Use up to 30 units/day, divided TID AC meals as per MD instructions 30 mL 12   ??? insulin detemir U-100 (LEVEMIR FLEXTOUCH U-100 INSULN) 100 unit/mL (3 mL) injection pen Use up to 30 units per day, as per MD instructions 30 mL 12   ??? Lacto-Bif-Sac-Bacil-Strep-bact (MVW COMPLETE FORMUL PROBIOTIC) 40 billion cell -15 mg CpDR Take 1 capsule by mouth daily.     ??? lamoTRIgine (LAMICTAL) 100 MG tablet Take 100 mg by mouth daily.     ??? lancets Misc Dispense 150 lancets, ok to sub any brand preferred by insurance/patient, use up to 5x/day, dx E08.9 150 each 12   ??? lisinopril (PRINIVIL,ZESTRIL) 10 MG tablet TAKE 1 TABLET BY MOUTH EVERY DAY 90 tablet 1   ??? melatonin 3 mg Tab Take 2 tablets (6 mg total) by mouth nightly.  0   ??? montelukast (SINGULAIR) 10 mg tablet Take 1 tablet (10 mg total) by mouth nightly. 90 tablet 3   ??? MVW Complete, pediatric multivit 61-D3-vit K, 1,500-800 unit-mcg cap Take 1 capsule by mouth Two (2) times a day. 30 capsule 0   ??? omeprazole (PRILOSEC) 40 MG capsule Take 1 capsule (40 mg total) by mouth daily. 90 capsule 3   ??? pen needle, diabetic (BD ULTRA-FINE NANO PEN NEEDLE) 32 gauge x 5/32 Ndle ok to sub any brand or size needle preferred by insurance/patient, use up to 4x/day, dx E 08.9 450 each 12   ??? pramipexole (MIRAPEX) 0.125 MG tablet Take 0.25 mg by mouth daily.      ??? rivaroxaban (XARELTO) 20 mg tablet Take 1 tablet (20 mg total) by mouth daily with evening meal. 90 tablet 3   ??? sodium chloride 7% 7 % Nebu Inhale 4 mL by nebulization Two (2) times a day. 720 mL 3   ??? amikacin liposomal-neb.accessr 590 mg/8.4 mL NbSp INHALE 590 MG DAILY 1 each 11   ??? blood sugar diagnostic Strp Dispense 150 blood glucose test strips, ok to sub any brand preferred by insurance/patient, use up to 5x/day, dx E 08.9 450 strip 12   ??? blood-glucose meter Misc Dispense meter that is preferred by patient's insurance company 1 each 0   ??? busPIRone (BUSPAR) 30 MG tablet Take 30 mg by mouth Two (2) times a day.     ??? cetirizine (ZYRTEC) 10 MG tablet TAKE 1 TABLET (10 MG TOTAL) BY MOUTH DAILY. 90 tablet 3   ??? cholecalciferol, vitamin D3, 1,000 unit tablet TAKE 1 TABLET BY MOUTH ONCE DAILY 100 tablet 10   ??? dornase alfa (PULMOZYME) 1 mg/mL nebulizer solution INHALE 1 VIAL VIA NEBULIZER TWO (2) TIMES A DAY. 450 mL 11   ??? lipase-protease-amylase (CREON) 36,000-114,000- 180,000 unit CpDR TAKE 7-8 CAPSULES BY MOUTH WITH MEALS AND 5-6 CAPSULES WITH SNACKS 600 capsule 11   ??? pregabalin (LYRICA) 300 MG capsule Take  1 capsule (300 mg total) by mouth Two (2) times a day. (Patient not taking: Reported on 03/17/2018) 180 capsule 0   ??? sodium bicarb-sodium chloride 700-2,300 mg Pack Irrigate each nostril with 120 mL of solution (1 packet mixed with 240 mL of distilled water) BID     ??? tezacaftor 100mg /ivacaftor 150mg  and ivacaftor 150mg  (SYMDEKO) tablets TAKE BY MOUTH AS DIRECTED ON PACKAGE LABELING 56 each 3   ??? traMADol (ULTRAM) 50 mg tablet Take 100 mg by mouth every six (6) hours as needed for pain.     ??? traZODone (DESYREL) 100 MG tablet Take 100 mg by mouth nightly.       No current facility-administered medications on file prior to visit.         Allergies:  Allergies as of 03/17/2018 - Reviewed 03/17/2018   Allergen Reaction Noted   ??? [place] [aztreonam lysine] Anaphylaxis 12/27/2016   ??? Cefepime Itching and Nausea Only 09/06/2015   ??? Other Anaphylaxis and Other (See Comments) 09/06/2015   ??? Slo-bid 100 Anaphylaxis 06/20/2017   ??? Banana Itching and Nausea And Vomiting 07/29/2016   ??? Tobramycin Tinnitus 09/06/2015     Genetics:  [id number] and [id number] insertion  ??  Airway Clearance Regimen:  HS BID  Pulmozyme  ??  Inhaled ABX:  Amikacin  ??  Hemoptysis:   No  ABPA:             No  Ptx:                  No  Sinusitus:       Yes  ??  Panc Insuf:     Yes  PEG:                No  DIOS:              No bowel issues  CF Liver Dz:   No  ??  Diabetes:        Uncontrolled - follows endocrine  Osteopenia:   N/A  ??  Depression:   Yes  ??  Other Co-morbidities:  N/A  ??  Social Setting:  Lives with wife near [place]    Micro History:  ??  MABSC  - Assumed S to erythro from [facility]  06/2017: MASBC not present, but M gordonae is   ??  Smooth PsA  S: Cipro, Levo, Tobra  I: Aztreo, Cefe, Ceftaz, PT, LVX  ??  Stenotrophamonas  R: Ceftat  ??  MABSC (macrolide sensitive)  Last Negative Cx: 03/2017  Last Positive: 02/21/17  ??  ??  Exacerbation History:  Frequent from 2017 into 2018  ??  BMI:                             Body mass index is 37 kg/m??.    Review of Systems:  A comprehensive review of systems was completed and negative except as noted in HPI.    PHYSICAL EXAM:   BP 130/74  - Pulse 113  - Temp 36.7 ??C (Oral)  - Resp 22  - Ht 182.9 cm (6')  - Wt (!) 120.2 kg (265 lb)  - SpO2 97%  - BMI 35.94 kg/m??     Gen: Patient awake, alert, oriented to time place and person, no pallor, icterus,  cyanosis or clubbing.  Eyes: Pupils equal round and reacting light bilaterally  Head neck ENT: No JVD, no lymphadenopathy-cervical or supraclavicular, no thyromegaly, moist  mucosa, normal oropharynx, no nasal polyps.  CVS: S1-S2 heard normally, no murmurs appreciated, no rubs or gallops  Respiratory: Air entry equal bilaterally without any crackles or wheeze  Abdomen: Soft, nontender, nondistended, no organomegaly, bowel sounds present  Neuro: Nonfocal neurological exam, no sensory deficits, cranial nerves grossly intact  Skin extremities: No rash, no pedal edema    LABORATORY and RADIOLOGY DATA:     Pulmonary Function Tests:        Pertinent Laboratory Data:  CF Sputum Culture   Date Value Ref Range Status   01/05/2018 OROPHARYNGEAL FLORA ISOLATED  Final   08/21/2017 3+ Oropharyngeal Flora Isolated  Final   08/21/2017 <1+ Smooth Pseudomonas aeruginosa (A)  Final     Comment:     Susceptibility Testing By Consultation Only        AFB Smear   Date Value Ref Range Status   01/02/2018  No Acid Fast Bacilli Seen Final    NO ACID FAST BACILLI SEEN- 3 negative smears do not exclude pulmonary TB. If active pulmonary TB is suspected, continue airborne isolation until pulmonary disease is excluded by negative cultures.     AFB Culture   Date Value Ref Range Status   01/02/2018 No Acid Fast Bacilli Detected  Final        IgE, Total   Date Value Ref Range Status   06/20/2017 32.8 4 - 59 kU/L Final        Total Bilirubin   Date Value Ref Range Status   01/03/2018 0.5 0.0 - 1.2 mg/dL Final     AST   Date Value Ref Range Status   01/03/2018 65 (H) 19 - 55 U/L Final     ALT   Date Value Ref Range Status   01/03/2018 110 (H) 19 - 72 U/L Final     Alkaline Phosphatase   Date Value Ref Range Status   01/03/2018 66 38 - 126 U/L Final        INR   Date Value Ref Range Status   08/19/2017 1.13  Final        Hemoglobin A1C   Date Value Ref Range Status   01/07/2018 9.0 (H) 4.8 - 5.6 % Final     Estimated Average Glucose   Date Value Ref Range Status   01/07/2018 212 mg/dL Final        Vitamin A Result   Date Value Ref Range Status   11/19/2016 44.4 32.5 - 78.0 mcg/dL Final     Comment:        -------------------ADDITIONAL INFORMATION-------------------  This test was developed and its performance characteristics   determined by [facility] in a manner consistent with CLIA   requirements. This test has not been cleared or approved by   the U.S. Food and Drug Administration.     Test Performed by:  [facility]  [address] [place], [place], [place] [address]     Vitamin D Total (25OH)   Date Value Ref Range Status   03/19/2017 27.9 20.0 - 80.0 ng/mL Final            ASSESSMENT and PLAN     Christian Young is a 32 y.o. male with cystic fibrosis comes for follow-up.  Currently due to lapse of his insurance, he has not been able to take his medicines regularly and has been missing his medicines very often.  Along with his wife, he claims that once the insurance is set up,  he would want to start taking all his medicines regularly.  However, does not feel extremely sick right now and is ready to go for a wedding in [place].  Due to possible active sinusitis right now, the recommended him to start using irrigational methods which are available over-the-counter.  We also advised him to start following up again with endocrinology once his insurance starts.  Referral to be given for the same.  Due to his chronic long-standing anticoagulation,, on his next follow-up visit, we will give him a referral for hematology as well.  For now, continue all the same medicines without any change.  We will also get sputum cultures for AFB today.  His treatment today and in October 2019 if sputum is negative.      The patient was seen with Dr. Lurena Nida and will return to clinic in 2 months    Maia Plan, MD  PGY 4, Pulmonary and Critical Care  Pager: [phone]  March 17, 2018 9:21 PM

## 2018-03-19 NOTE — Unmapped (Signed)
Hi Dr. [name],  Pt called asking if a new prescription for Lyrica and Trazodone might be sent to his [facility] pharmacy on file?  Evidently the [facility] program has agreed to help with the cost of these prescriptions.  Please advise.  Thanks.

## 2018-03-19 NOTE — Unmapped (Addendum)
Adult Cystic Fibrosis Clinic Pharmacist: Brief Medication Follow Up Note     Primary pulmonologist: Maia Plan    Christian Young is a 32 y.o. male with cystic fibrosis is being seen medication follow up.  Christian Young is still without insurance, should be getting insurance by Sept 1.    Reviewed CF Medications:  -Symdeko: No missed doses, able to get supply via [employer] PAP   -Arikayce: No missed doses, able to get supply via [employer] PAP   -Creon: No missed doses. Down to 1.5 bottles.  -Clofazamine: No missed doses, able to get supply via IND  -Pulmozyme: No missed doses, he has stock piled.   -Insulin: Did not report.    Other Medications:  -Viibryd: Out of medication.  -Lamotrigine: Low supply    Plan:  -For now, he has provided all the documentation to apply for [facility] and PAP Program  -Unfortunately, he had insurance lapse in October and has used his 14-day Temporary Benefit  -CPP reached out to [employer] representatives to obtain 30-day Voucher, to be sent to CPP to provide to [facility]    -When he gets [facility] PAP approval can obtain:   CF Meds: Pulmozyme, Creon, HTS, MVW Complete Formulation (will request non-formulary approval), omeprazole, azithromycin, montelukast, albuterol nebs and HFA, Symbicort would need to be changed to [facility] HFA  Other: rivaroxaban, buspirone, lamotrigine, carvedilol, lisinopril, pramipexole, tramadol, trazodone, insulin, pregabalin (brand Lyrica is tier 2 would need PA)    -Viibryd: Had patient fill out [employer] PAP form to obtain from manufacturer. Gave to [place] to help process.  -Arikayce and Symdeko will continue to be provided by PAP programs via the companies.    -Advised for patient to send CPP copy of new pharmacy insurance card once it becomes active to help get PA process initiated    [name], PharmD, BCPS, CPP  Clinical Pharmacist Practitioner  [facility]  [facility]  Pager: [phone]             [name], PharmD, BCPS, CPP Clinical Pharmacist Practitioner  [facility]

## 2018-03-19 NOTE — Unmapped (Signed)
Per pt request, SW submitted [facility] application to [facility], April Gerringer, for processing today. SW also spoke with PAP re: status of application and was informed that the application remains pending. SW will call back again on Friday.

## 2018-03-21 MED ORDER — LIPASE-PROTEASE-AMYLASE 36,000-114,000-180,000 UNIT CAPSULE,DELAY REL: capsule | 11 refills | 0 days | Status: AC

## 2018-03-21 MED ORDER — LIPASE-PROTEASE-AMYLASE 36,000-114,000-180,000 UNIT CAPSULE,DELAY REL: capsule | 30 refills | 0 days

## 2018-03-21 MED ORDER — PREGABALIN 300 MG CAPSULE
ORAL_CAPSULE | Freq: Two times a day (BID) | ORAL | 0 refills | 0.00000 days | Status: CP
Start: 2018-03-21 — End: 2018-03-21

## 2018-03-21 MED ORDER — PREGABALIN 300 MG CAPSULE: capsule | 0 refills | 0 days

## 2018-03-21 MED ORDER — TRAZODONE 100 MG TABLET: 100 mg | tablet | Freq: Every evening | 0 refills | 0 days | Status: AC

## 2018-03-21 MED ORDER — TRAZODONE 100 MG TABLET
ORAL_TABLET | Freq: Every evening | ORAL | 0 refills | 0.00000 days | Status: CP
Start: 2018-03-21 — End: 2018-03-21
  Filled 2018-03-26: qty 90, 90d supply, fill #0

## 2018-03-21 MED ORDER — TRAZODONE 100 MG TABLET: 100 mg | tablet | 0 refills | 0 days

## 2018-03-21 MED ORDER — TRAZODONE 100 MG TABLET: 100 mg | tablet | Freq: Every evening | 3 refills | 0 days | Status: AC

## 2018-03-21 MED ORDER — LIPASE-PROTEASE-AMYLASE 36,000-114,000-180,000 UNIT CAPSULE,DELAY REL
ORAL_CAPSULE | ORAL | 30 refills | 0.00000 days | Status: CP
Start: 2018-03-21 — End: 2018-03-21
  Filled 2018-03-26: 30d supply, fill #0

## 2018-03-21 MED ORDER — LIPASE-PROTEASE-AMYLASE 36,000-114,000-180,000 UNIT CAPSULE,DELAY REL: capsule | 3 refills | 0 days | Status: AC

## 2018-03-21 MED ORDER — PREGABALIN 300 MG CAPSULE: 300 mg | capsule | Freq: Two times a day (BID) | 0 refills | 0 days | Status: AC

## 2018-03-21 NOTE — Unmapped (Signed)
Adult Cystic Fibrosis Clinic Pharmacist Note: RX Renewal Request    Sent renewal RX for:  Creon  Trazodone    Pharmacy sent to:  [facility]    Patient has full [place] PAP benefit.    Routed this encounter to Dr. [name], Pregabalin is pended in this encounter.     [name], PharmD, BCPS, CPP  Clinical Pharmacist Practitioner  [facility]  [facility]  Pager: [phone]      CC:   Cyndie Mull, RN

## 2018-03-21 NOTE — Unmapped (Signed)
[facility]      [facility]       Christian Young is a 31 y.o. male followed by [facility] for his CF. SW following for psychosocial support given insurance lapse.     SW contacted PAP this morning and was informed that Christian Young's bank transaction history would not suffice. PAP requesting May-July bank statements instead.    SW updated Christian Young by phone today and received monthly bank statements, which SW walked to PAP office.     [name] now approved for PAP effective 03/21/18.    SW checked on any updates in accessing Vibryd. [name] has made request through [employer], which is willing to help offer reimbursement. Christian Young planning to f/u on this. He also does not yet have behavioral health appointments scheduled for Sept, as he has not yet looked into whether his current providers are in-network with Rosann Auerbach (his new insurance carrier). Plan made today to touch base again next week re: mood/coping and connecting to these resources.     [name] denies any worsening of his behavioral health symptoms today, says that he's doing pretty good overall.    CF SW continuing to follow.    Jerl Mina, LCSW  March 21, 2018 12:13 PM

## 2018-03-21 NOTE — Unmapped (Signed)
CREON 36000 UNITS (MAX 30 DAY SUPPLY) (LAST 09/29/17)   AND TRAZODONE 100 MG   TEST CLAIM $0.00 PAP

## 2018-03-25 MED ORDER — PREGABALIN 300 MG CAPSULE
ORAL_CAPSULE | 0 refills | 0 days | Status: CP
Start: 2018-03-25 — End: 2018-04-21
  Filled 2018-03-26: qty 120, 30d supply, fill #0

## 2018-03-25 NOTE — Unmapped (Addendum)
Symdeko not covered on Pharmacy Assistance.  Patient has enough to last until his insurance kicks in Sept 1. Will call back then to get new ins info -PSD     [facility] Refill and Clinical Coordination Note  Medication(s): Creon 36000, Lyrica 300mg  (new RX requested, fill did not convert from [facility]), Trazodone 100mg     Denied refills of Pulmozye ( has 1 more box), no other meds needed at this time    Welton Bord, DOB: 02/23/1986  Phone: [phone] (home) , Alternate phone contact: N/A  Shipping address: [address]  [place] [address]  Phone or address changes today?: No  All above HIPAA information verified.  Insurance changes? No    Completed refill and clinical call assessment today to schedule patient's medication shipment from the [facility] [phone]).      MEDICATION RECONCILIATION    Confirmed the medication and dosage are correct and have not changed: Yes, regimen is correct and unchanged.    Were there any changes to your medication(s) in the past month:  No, there are no changes reported at this time.    ADHERENCE    Is this medicine transplant or covered by Medicare Part B? No.        Did you miss any doses in the past 4 weeks? No missed doses reported.  Adherence counseling provided? Not needed     SIDE EFFECT MANAGEMENT    Are you tolerating your medication?:  Garik reports tolerating the medication.  Side effect management discussed: None      Therapy is appropriate and should be continued.    Evidence of clinical benefit: See Epic note from 03/17/18      FINANCIAL/SHIPPING    Delivery Scheduled: Yes, Expected medication delivery date: 03/27/18.  However, Rx request for refills was sent to the provider as there are none remaining. Lyrica refill did not convert, asking for new RX    Additional medications refilled: No additional medications/refills needed at this time.    The patient will receive a drug information handout for each medication shipped and additional FDA Medication Guides as required.      Yasseen did not have any additional questions at this time.    Delivery address confirmed in Epic.     We will follow up with patient monthly for standard refill processing and delivery.      Thank you,  Julianne Rice   [facility] Specialty Pharmacist

## 2018-03-26 MED FILL — TRAZODONE 100 MG TABLET: 90 days supply | Qty: 90 | Fill #0 | Status: AC

## 2018-03-26 MED FILL — CREON 36,000 UNIT-114,000 UNIT-180,000 UNIT CAPSULE,DELAYED RELEASE: 30 days supply | Qty: 1260 | Fill #0 | Status: AC

## 2018-03-26 MED FILL — LYRICA 300 MG CAPSULE: 30 days supply | Qty: 120 | Fill #0 | Status: AC

## 2018-03-28 NOTE — Unmapped (Signed)
CF SW received email reply from [facility] that he was able to access all his medications, including his Vibryd, which he restarted yesterday (8/15). States that he is not feeling well and thinks he may need a hospitalization. No further concerns cited. Updated RN coordinators, per his request, who will call him.

## 2018-03-28 NOTE — Unmapped (Signed)
CF SW called to check in on behavioral health symptoms and access to medications (PAP now approved - Creon and Trazodone to be provided). SW left a voicemail and also reached out via email today. SW will continue to follow.    Jerl Mina, LCSW  March 28, 2018 12:04 PM

## 2018-03-29 NOTE — Unmapped (Signed)
[name] emailed his Child psychotherapist,     Thank you for checking on me, yes i got all my meds that i have been missing. I was able to get my viibrid and also got my lyrica as well. I am feeling ok the trip took alot out of me. I feel like i am about to have tp go into the hospital.     Spoke with [name] about his symptoms. He has been trying to wait until September (when insurance starts), but feels like today it has been harder and harder to wait.     Took a trip to [place] (15 hour drive) in the past two weeks. He returned on Monday.  Feels that the drive and contact with sick people have contributed to his symptoms.      Date of symptom onset: notes symptoms have been going on for a while.  Had noted at the last clinic visit he would likely need to be admitted for an exacerbation.  Notes his wife is also sick with chest congestion, likely viral URI.     Symptoms include:   Pulmonary-    Increased cough yes, coughing at night    Productive cough no, no matter what he does, can't get anything out    Hemoptysis  no   Chest pain  yes, related coughing, joint pain at knees/ankles/elbows    Chest tightness yes   Fever  unknown (thermometer is broken)   Chills  unknown    Nightsweats yes, couple of times    Change in appetite yes, getting fuller faster, not much appetite    Other:  feels unbalaced and dizzy       Current measures they are taken:   Able to perform regular AC & breathing treatments: yes   AC: Vest, twice daily   Nebs: twice daily   Brought neb compressor and vest on trip, but unable to do treatments (per request of his host)   Requiring additional: no   Currently on inhaled abx: Arikayce (amikacin for MAC)     Last culture results: 03/17/18  3+ Oropharyngeal Flora Isolated   <1+ Smooth Pseudomonas aeruginosa    Has been doing sinus rinses, doesn't help that much. Feels that IV antibiotics are in order.     Confirmed with SW that charity care can work Theatre manager. Advised to come to hospital for care.     Will route note and notify patient's pulmonologist, Dr. [name].     [name], RN  CF Nurse Coordinator   [phone]

## 2018-03-31 NOTE — Unmapped (Signed)
Spoke with patient. He forgot that he was dog sitting and can't leave his wife with two dogs. [name] is dog sitting until this coming Saturday. Promised he would use his best judgement and come to the ED if he begins to feels worse. Will place a bed request for tomorrow, should the hospital be able to offer a direct admit in the next few days.

## 2018-04-05 ENCOUNTER — Ambulatory Visit
Admit: 2018-04-05 | Discharge: 2018-04-13 | Disposition: A | Discharge status: Home Health | Payer: PRIVATE HEALTH INSURANCE | Admitting: Pulmonary Disease

## 2018-04-05 LAB — CBC W/ AUTO DIFF
BASOPHILS ABSOLUTE COUNT: 0.2 10*9/L — ABNORMAL HIGH (ref 0.0–0.1)
BASOPHILS RELATIVE PERCENT: 2.2 %
EOSINOPHILS ABSOLUTE COUNT: 0 10*9/L (ref 0.0–0.4)
EOSINOPHILS RELATIVE PERCENT: 0.4 %
HEMATOCRIT: 41.7 % (ref 41.0–53.0)
LARGE UNSTAINED CELLS: 2 % (ref 0–4)
LYMPHOCYTES ABSOLUTE COUNT: 1.4 10*9/L — ABNORMAL LOW (ref 1.5–5.0)
LYMPHOCYTES RELATIVE PERCENT: 19.3 %
MEAN CORPUSCULAR HEMOGLOBIN CONC: 32.6 g/dL (ref 31.0–37.0)
MEAN CORPUSCULAR HEMOGLOBIN: 25.9 pg — ABNORMAL LOW (ref 26.0–34.0)
MEAN CORPUSCULAR VOLUME: 79.4 fL — ABNORMAL LOW (ref 80.0–100.0)
MEAN PLATELET VOLUME: 8.7 fL (ref 7.0–10.0)
MONOCYTES ABSOLUTE COUNT: 0.4 10*9/L (ref 0.2–0.8)
MONOCYTES RELATIVE PERCENT: 5.9 %
NEUTROPHILS ABSOLUTE COUNT: 4.9 10*9/L (ref 2.0–7.5)
NEUTROPHILS RELATIVE PERCENT: 70.1 %
RED BLOOD CELL COUNT: 5.26 10*12/L (ref 4.50–5.90)
RED CELL DISTRIBUTION WIDTH: 15.8 % — ABNORMAL HIGH (ref 12.0–15.0)
WBC ADJUSTED: 7 10*9/L (ref 4.5–11.0)

## 2018-04-05 LAB — COMPREHENSIVE METABOLIC PANEL
ALBUMIN: 4.3 g/dL (ref 3.5–5.0)
ALKALINE PHOSPHATASE: 87 U/L (ref 38–126)
ALT (SGPT): 69 U/L (ref 19–72)
ANION GAP: 8 mmol/L — ABNORMAL LOW (ref 9–15)
AST (SGOT): 62 U/L — ABNORMAL HIGH (ref 19–55)
BILIRUBIN TOTAL: 0.5 mg/dL (ref 0.0–1.2)
BLOOD UREA NITROGEN: 17 mg/dL (ref 7–21)
BUN / CREAT RATIO: 25
CHLORIDE: 97 mmol/L — ABNORMAL LOW (ref 98–107)
CO2: 27 mmol/L (ref 22.0–30.0)
EGFR CKD-EPI AA MALE: >90 mL/min/{1.73_m2} (ref >=60)
EGFR CKD-EPI NON-AA MALE: >90 mL/min/{1.73_m2} (ref >=60)
GLUCOSE RANDOM: 372 mg/dL — ABNORMAL HIGH (ref 65–179)
POTASSIUM: 4.6 mmol/L (ref 3.5–5.0)
PROTEIN TOTAL: 7.1 g/dL (ref 6.5–8.3)
SODIUM: 132 mmol/L — ABNORMAL LOW (ref 135–145)

## 2018-04-05 LAB — HYPOCHROMIA

## 2018-04-05 LAB — ANION GAP: Anion gap 3:SCnc:Pt:Ser/Plas:Qn:: 8 — ABNORMAL LOW

## 2018-04-05 NOTE — Unmapped (Signed)
Patient rounds completed. The following patient needs were addressed:  Pain, Toileting, Personal Belongings, Plan of Care, Call Bell in Reach and Bed Position Low .

## 2018-04-05 NOTE — Unmapped (Signed)
Pt reports hx of CF, states he has had several weeks of nonproductive cough with night sweats.  Denies n/v/d. +chest pain

## 2018-04-05 NOTE — Unmapped (Signed)
[facility]  Emergency Department Provider Note    ED Clinical Impression     Final diagnoses:   Cystic fibrosis with pulmonary exacerbation (CMS-HCC) (Primary)       Initial Impression, ED Course, Assessment and Plan     Impression: 32 y.o. male with a PMH of anxiety, cystic fibrosis, depression, DVT, and HTN who presents to the ED with 2 weeks of cough that has recently become productive with accompanying night sweats, shortness of breath, and chest pain.    On exam, patient is nontoxic appearing and in NAD. VS unremarkable. Heart rate and rhythm normal. Lungs CTAB. Patient is congested. Abdomen soft and nondistended. Exam otherwise unremarkable.     Differential diagnosis includes CF exacerbation vs PNA vs viral.    Plan for basic labs, blood cultures, EKG, and chest XR. Will give morphine and fluids and reassess.    12:42 PM  CMP with glucose elevated to 372, AST elevated to 62, otherwise unremarkable. CBC unremarkable. Pending chest XR.    12:52 PM  Chest XR negative. Will page [facility] for admission.    Additional Medical Decision Making     I independently visualized the radiology images and agree with radiology reads except where noted above.  I independently visualized the EKG tracing.  I reviewed prior records in Epic as detailed in my note.      Labs and radiology results that were available during my care of the patient were independently reviewed by me and considered in my medical decision making.    Portions of this record have been created using Scientist, clinical (histocompatibility and immunogenetics). Dictation errors have been sought, but may not have been identified and corrected.  ____________________________________________    Time seen: April 05, 2018 11:04 AM    I have reviewed the triage vital signs and the nursing notes.     History     Chief Complaint  Cough      HPI   Christian Young is a 32 y.o. male with a PMH of anxiety, cystic fibrosis, depression, DVT, and HTN who presents to the ED with 2 weeks of cough that has recently become productive with accompanying night sweats, shortness of breath, and chest pain. Patient reports that he has had a cough and night sweats for the past 2 weeks. He states that the cough was initially unproductive, but has become productive with mucus over the past several days. Patient also states that he has had elevated BG of 400-500 despite taking his medications as prescribed. Endorses accompanying shortness of breath, chest pain, and chills. He states that he has not taken any medications at home for his cough or other symptoms. Denies nausea, vomiting, diarrhea, measured fevers, rashes, or other concerning symptoms. Denies tobacco or drug use. Denies allergies to medications.      Past Medical History:   Diagnosis Date   ??? Anxiety    ??? Chronic pain disorder    ??? Cystic fibrosis (CMS-HCC)    ??? Depression    ??? Hypertension        Patient Active Problem List   Diagnosis   ??? Essential hypertension   ??? History of DVT (deep vein thrombosis)   ??? Depressive disorder, r/o major depression, recurrent, moderate/severe and dysthymic disorder   ??? Mood disorder (CMS-HCC)   ??? Anxiety   ??? Cystic fibrosis with intestinal manifestation (CMS-HCC)   ??? Chronic pansinusitis   ??? Pancreatic insufficiency due to cystic fibrosis (CMS-HCC)   ??? Mycobacterium abscessus infection   ???  Pseudomonas pneumonia (CMS-HCC)   ??? Anticoagulated   ??? Bronchiectasis (CMS-HCC)   ??? Chronic deep vein thrombosis (DVT) of lower extremity (CMS-HCC)   ??? Cystic fibrosis with pulmonary exacerbation (CMS-HCC)       Past Surgical History:   Procedure Laterality Date   ??? PR REMOVAL OF LUNG,LOBECTOMY Right 03/29/2017    Procedure: REMOVAL OF LUNG, OTHER THAN PNEUMONECTOMY; SINGLE LOBE (LOBECTOMY);  Surgeon: Cherie Dark, MD;  Location: MAIN OR [facility];  Service: Thoracic         Current Facility-Administered Medications:   ???  MORPhine 4 mg/mL injection 4 mg, 4 mg, Intravenous, Q1H PRN, Gilman Schmidt, MD, 4 mg at 04/05/18 1530  ??? piperacillin-tazobactam (ZOSYN) IVPB (premix) 4.5 g, 4.5 g, Intravenous, Q6H, Jeanie Sewer, MD  ???  rivaroxaban (XARELTO) tablet 10 mg, 10 mg, Oral, Q24H, Jeanie Sewer, MD  ???  tobramycin (NEBCIN) 180.4 mg in sodium chloride (NS) 0.9 % 100 mL IVPB, 1.5 mg/kg (Order-Specific), Intravenous, Q12H **AND** Inpatient consult to Pharmacy RX to dose: tobramycin, , , Once, Jeanie Sewer, MD    Current Outpatient Medications:   ???  baclofen (LIORESAL) 20 MG tablet, Take 20 mg by mouth Three (3) times a day., Disp: , Rfl:   ???  albuterol (PROVENTIL HFA;VENTOLIN HFA) 90 mcg/actuation inhaler, Inhale 2 puffs every six (6) hours as needed., Disp: 1 Inhaler, Rfl: 1  ???  albuterol 2.5 mg/0.5 mL nebulizer solution, Inhale 0.5 mL (2.5 mg total) by nebulization every six (6) hours as needed for wheezing., Disp: 90 each, Rfl: 3  ???  amikacin liposomal-neb.accessr 590 mg/8.4 mL NbSp, INHALE 590 MG DAILY, Disp: 1 each, Rfl: 11  ???  azithromycin (ZITHROMAX) 250 MG tablet, Take 250 mg by mouth daily., Disp: , Rfl:   ???  blood sugar diagnostic Strp, Dispense 150 blood glucose test strips, ok to sub any brand preferred by insurance/patient, use up to 5x/day, dx E 08.9, Disp: 450 strip, Rfl: 12  ???  blood-glucose meter Misc, Dispense meter that is preferred by [employer], Disp: 1 each, Rfl: 0  ???  budesonide-formoterol (SYMBICORT) 160-4.5 mcg/actuation inhaler, Inhale 2 puffs Two (2) times a day., Disp: , Rfl:   ???  busPIRone (BUSPAR) 30 MG tablet, Take 30 mg by mouth Two (2) times a day., Disp: , Rfl:   ???  carvedilol (COREG) 3.125 MG tablet, Take 1 tablet (3.125 mg total) by mouth Two (2) times a day., Disp: 180 tablet, Rfl: 3  ???  cetirizine (ZYRTEC) 10 MG tablet, TAKE 1 TABLET (10 MG TOTAL) BY MOUTH DAILY., Disp: 90 tablet, Rfl: 3  ???  cholecalciferol, vitamin D3, 1,000 unit tablet, TAKE 1 TABLET BY MOUTH ONCE DAILY, Disp: 100 tablet, Rfl: 10  ???  CLOFAZIMINE ORAL, Take 100 mg by mouth daily. , Disp: , Rfl:   ???  dornase alfa (PULMOZYME) 1 mg/mL nebulizer solution, INHALE 1 VIAL VIA NEBULIZER TWO (2) TIMES A DAY., Disp: 450 mL, Rfl: 11  ???  fluticasone (FLONASE) 50 mcg/actuation nasal spray, 2 sprays by Each Nare route daily., Disp: 16 g, Rfl: 0  ???  insulin ASPART (NOVOLOG FLEXPEN) 100 unit/mL injection pen, Use up to 30 units/day, divided TID AC meals as per MD instructions, Disp: 30 mL, Rfl: 12  ???  insulin detemir U-100 (LEVEMIR FLEXTOUCH U-100 INSULN) 100 unit/mL (3 mL) injection pen, Use up to 30 units per day, as per MD instructions, Disp: 30 mL, Rfl: 12  ???  Lacto-Bif-Sac-Bacil-Strep-bact (MVW COMPLETE FORMUL  PROBIOTIC) 40 billion cell -15 mg CpDR, Take 1 capsule by mouth daily., Disp: , Rfl:   ???  lamoTRIgine (LAMICTAL) 100 MG tablet, Take 100 mg by mouth daily., Disp: , Rfl:   ???  lancets Misc, Dispense 150 lancets, ok to sub any brand preferred by insurance/patient, use up to 5x/day, dx E08.9, Disp: 150 each, Rfl: 12  ???  lipase-protease-amylase (CREON) 36,000-114,000- 180,000 unit CpDR, TAKE 7-8 CAPSULES BY MOUTH WITH MEALS AND 5-6 CAPSULES WITH SNACKS, Disp: 1800 capsule, Rfl: 3  ???  lisinopril (PRINIVIL,ZESTRIL) 10 MG tablet, TAKE 1 TABLET BY MOUTH EVERY DAY, Disp: 90 tablet, Rfl: 1  ???  melatonin 3 mg Tab, Take 2 tablets (6 mg total) by mouth nightly., Disp: , Rfl: 0  ???  montelukast (SINGULAIR) 10 mg tablet, Take 1 tablet (10 mg total) by mouth nightly., Disp: 90 tablet, Rfl: 3  ???  MVW Complete, pediatric multivit 61-D3-vit K, 1,500-800 unit-mcg cap, Take 1 capsule by mouth Two (2) times a day., Disp: 30 capsule, Rfl: 0  ???  omeprazole (PRILOSEC) 40 MG capsule, Take 1 capsule (40 mg total) by mouth daily. (Patient taking differently: Take 20 mg by mouth daily. ), Disp: 90 capsule, Rfl: 3  ???  pen needle, diabetic (BD ULTRA-FINE NANO PEN NEEDLE) 32 gauge x 5/32 Ndle, ok to sub any brand or size needle preferred by insurance/patient, use up to 4x/day, dx E 08.9, Disp: 450 each, Rfl: 12  ???  pramipexole (MIRAPEX) 0.125 MG tablet, Take 0.25 mg by mouth daily. , Disp: , Rfl:   ???  pregabalin (LYRICA) 300 MG capsule, TAKE 2 CAPSULES (300MG ) BY MOUTH TWICE DAILY, Disp: 180 capsule, Rfl: 0  ???  rivaroxaban (XARELTO) 20 mg tablet, Take 1 tablet (20 mg total) by mouth daily with evening meal., Disp: 90 tablet, Rfl: 3  ???  sodium bicarb-sodium chloride 700-2,300 mg Pack, Irrigate each nostril with 120 mL of solution (1 packet mixed with 240 mL of distilled water) BID, Disp: , Rfl:   ???  sodium chloride 7% 7 % Nebu, Inhale 4 mL by nebulization Two (2) times a day., Disp: 720 mL, Rfl: 3  ???  tezacaftor 100mg /ivacaftor 150mg  and ivacaftor 150mg  (SYMDEKO) tablets, TAKE BY MOUTH AS DIRECTED ON PACKAGE LABELING, Disp: 56 each, Rfl: 3  ???  traMADol (ULTRAM) 50 mg tablet, Take 100 mg by mouth every six (6) hours as needed for pain., Disp: , Rfl:   ???  traZODone (DESYREL) 100 MG tablet, TAKE 1 TABLET BY MOUTH NIGHTLY, Disp: 90 tablet, Rfl: 3  ???  vilazodone (VIIBRYD) 40 mg Tab, Take 1 tablet (40 mg total) by mouth daily., Disp: 90 tablet, Rfl: 0    Allergies  Cayston [aztreonam lysine]; Cefepime; Other; Slo-bid 100; Banana; and Tobramycin    Family History   Problem Relation Age of Onset   ??? Bipolar disorder Mother    ??? Depression Mother        Social History  Social History     Tobacco Use   ??? Smoking status: Never Smoker   ??? Smokeless tobacco: Never Used   Substance Use Topics   ??? Alcohol use: Yes     Comment: rare   ??? Drug use: No       Review of Systems  Please see HPI above for pertinent positives and negatives. All other systems have been reviewed and are negative except as otherwise documented.    Physical Exam     ED Triage Vitals [04/05/18 1051]  Enc Vitals Group      BP 138/81      Heart Rate 82      SpO2 Pulse       Resp 18      Temp 35.8 ??C (96.4 ??F)      Temp Source Oral      SpO2 96 %       Constitutional: Alert and oriented. Well appearing and in no distress.  Eyes: Conjunctivae are normal.  ENT       Head: Normocephalic and atraumatic.       Nose: Congestion.       Mouth/Throat: Mucous membranes are moist.       Neck: No stridor.  Hematological/Lymphatic/Immunilogical: No cervical lymphadenopathy.  Cardiovascular: Normal rate, regular rhythm. Normal and symmetric distal pulses are present in all extremities. No pedal edema.   Respiratory: Normal respiratory effort. Breath sounds are normal.  Gastrointestinal: Soft and nontender. There is no CVA tenderness.  Musculoskeletal: Nontender with normal range of motion in all extremities.  Neurologic: Normal speech and language. No gross focal neurologic deficits are appreciated.  Skin: Skin is warm, dry and intact. No rash noted.  Psychiatric: Mood and affect are normal. Speech and behavior are normal.      EKG     See procedure note if obtained.    Radiology     See imaging tab for radiology reports.      Documentation assistance was provided by Pamala Duffel, Scribe, on April 05, 2018 at 11:04 AM for Forest Gleason, MD.  April 05, 2018 3:47 PM. Documentation assistance provided by the scribe. I was present during the time the encounter was recorded. The information recorded by the scribe was done at my direction and has been reviewed and validated by me.        Gilman Schmidt, MD  04/05/18 [id number]

## 2018-04-05 NOTE — Unmapped (Signed)
MD at bedside. 

## 2018-04-05 NOTE — Unmapped (Signed)
ED Procedure Note    EKG Interpretation  Date/Time: 04/05/2018 3:48 PM  Performed by: Gilman Schmidt, MD  Authorized by: Gilman Schmidt, MD     Interpretation:     Interpretation: normal    Rate:     ECG rate:  94    ECG rate assessment: normal    Rhythm:     Rhythm: sinus rhythm    Ectopy:     Ectopy: none    QRS:     QRS axis:  Normal    QRS intervals:  Normal  Conduction:     Conduction: normal    ST segments:     ST segments:  Normal

## 2018-04-05 NOTE — Unmapped (Signed)
Tried to call report, will call back RN

## 2018-04-05 NOTE — Unmapped (Signed)
PT HERE BECAUSE HE HAS BEEN HAVING NIGHT SWEATS AND A PRODUCTIVE COUGH WITH FEVER.

## 2018-04-05 NOTE — Unmapped (Signed)
Report given to gLADIS rn 6 BT. Patient care transferred at this time.

## 2018-04-06 LAB — COMPREHENSIVE METABOLIC PANEL
ALBUMIN: 3.8 g/dL (ref 3.5–5.0)
ALBUMIN: 3.9 g/dL (ref 3.5–5.0)
ALKALINE PHOSPHATASE: 76 U/L (ref 38–126)
ALKALINE PHOSPHATASE: 75 U/L (ref 38–126)
ALT (SGPT): 79 U/L — ABNORMAL HIGH (ref 19–72)
ALT (SGPT): 88 U/L — ABNORMAL HIGH (ref 19–72)
ANION GAP: 12 mmol/L (ref 9–15)
AST (SGOT): 84 U/L — ABNORMAL HIGH (ref 19–55)
AST (SGOT): 113 U/L — ABNORMAL HIGH (ref 19–55)
BILIRUBIN TOTAL: 0.6 mg/dL (ref 0.0–1.2)
BILIRUBIN TOTAL: 0.9 mg/dL (ref 0.0–1.2)
BLOOD UREA NITROGEN: 17 mg/dL (ref 7–21)
BLOOD UREA NITROGEN: 16 mg/dL (ref 7–21)
BUN / CREAT RATIO: 21
BUN / CREAT RATIO: 19
CALCIUM: 8.9 mg/dL (ref 8.5–10.2)
CALCIUM: 9 mg/dL (ref 8.5–10.2)
CHLORIDE: 100 mmol/L (ref 98–107)
CHLORIDE: 97 mmol/L — ABNORMAL LOW (ref 98–107)
CO2: 26 mmol/L (ref 22.0–30.0)
CREATININE: 0.81 mg/dL (ref 0.70–1.30)
CREATININE: 0.83 mg/dL (ref 0.70–1.30)
EGFR CKD-EPI AA MALE: >90 mL/min/{1.73_m2} (ref >=60)
EGFR CKD-EPI NON-AA MALE: >90 mL/min/{1.73_m2} (ref >=60)
EGFR CKD-EPI NON-AA MALE: >90 mL/min/{1.73_m2} (ref >=60)
GLUCOSE RANDOM: 359 mg/dL — ABNORMAL HIGH (ref 65–179)
POTASSIUM: 4.1 mmol/L (ref 3.5–5.0)
POTASSIUM: 4.4 mmol/L (ref 3.5–5.0)
PROTEIN TOTAL: 6.4 g/dL — ABNORMAL LOW (ref 6.5–8.3)
PROTEIN TOTAL: 6.5 g/dL (ref 6.5–8.3)
SODIUM: 135 mmol/L (ref 135–145)
SODIUM: 132 mmol/L — ABNORMAL LOW (ref 135–145)

## 2018-04-06 LAB — HEMOGLOBIN: Lab: 12.8 — ABNORMAL LOW

## 2018-04-06 LAB — CBC
HEMATOCRIT: 39.8 % — ABNORMAL LOW (ref 41.0–53.0)
HEMOGLOBIN: 12.8 g/dL — ABNORMAL LOW (ref 13.5–17.5)
MEAN CORPUSCULAR HEMOGLOBIN CONC: 32 g/dL (ref 31.0–37.0)
MEAN CORPUSCULAR HEMOGLOBIN: 25.7 pg — ABNORMAL LOW (ref 26.0–34.0)
MEAN CORPUSCULAR VOLUME: 80.2 fL (ref 80.0–100.0)
MEAN PLATELET VOLUME: 8.6 fL (ref 7.0–10.0)
PLATELET COUNT: 235 10*9/L (ref 150–440)
RED CELL DISTRIBUTION WIDTH: 15.6 % — ABNORMAL HIGH (ref 12.0–15.0)
WBC ADJUSTED: 9.2 10*9/L (ref 4.5–11.0)

## 2018-04-06 LAB — MAGNESIUM: Magnesium:MCnc:Pt:Ser/Plas:Qn:: 1.5 — ABNORMAL LOW

## 2018-04-06 LAB — ESTIMATED AVERAGE GLUCOSE: Estimated average glucose:MCnc:Pt:Bld:Qn:Estimated from glycated hemoglobin: 260

## 2018-04-06 LAB — HEMOGLOBIN A1C: ESTIMATED AVERAGE GLUCOSE: 260 mg/dL

## 2018-04-06 LAB — SODIUM: Sodium:SCnc:Pt:Ser/Plas:Qn:: 135

## 2018-04-06 LAB — ALKALINE PHOSPHATASE: Alkaline phosphatase:CCnc:Pt:Ser/Plas:Qn:: 75

## 2018-04-06 NOTE — Unmapped (Signed)
Pt admitted to unit from ED for CF exacerbation. Pt pleasant, A&O x4, states generalized pain controlled w/ IV morphine given in ED. Oriented to room and hospital, updated POC.       Problem: Adult Inpatient Plan of Care  Goal: Plan of Care Review  Outcome: Ongoing - Unchanged  Goal: Patient-Specific Goal (Individualization)  Outcome: Ongoing - Unchanged  Goal: Absence of Hospital-Acquired Illness or Injury  Outcome: Ongoing - Unchanged  Goal: Optimal Comfort and Wellbeing  Outcome: Ongoing - Unchanged  Goal: Readiness for Transition of Care  Outcome: Ongoing - Unchanged  Goal: Rounds/Family Conference  Outcome: Ongoing - Unchanged

## 2018-04-06 NOTE — Unmapped (Signed)
General Medicine History and Physical    Assessment/Plan:    Principal Problem:    DOE (dyspnea on exertion)  Active Problems:    Essential hypertension    History of DVT (deep vein thrombosis)    Anxiety    Pancreatic insufficiency due to cystic fibrosis (CMS-HCC)    Chronic deep vein thrombosis (DVT) of lower extremity (CMS-HCC)    Cystic fibrosis with pulmonary exacerbation (CMS-HCC)    Nonproductive cough      Christian Young is a 32 y.o. male with PMHx of CF s/p right upper lobectomy in 2008, CF related pancreatic deficiency and DM as well as anxiety and depression who presents to [facility] with DOE (dyspnea on exertion) along with increased shortness of breath on exertion, and chest pain which has been getting over the course of 1-2 months.    CF Bronchiectasis exacerbation: Stable. On room air. Still waiting for AFB Cx and CF sputum Cx to be collected. Previous sputum cultures sent in during last clinic visit- growing pseudomonas only intermediately susceptible to ciprofloxacin, Zosyn and tobramycin. RPP negative and BCx no growth to date.   - Continue IV tobra, zosyn. Day 2/14  - Airway clearance (vest, Waymon Budge) 4x daily RT  - Continue nebs/inhalers- symbicort, Buspar, pulmozyme, hypertonic saline,   - Continue singulair 10 mg qHS, Symdeko  - Continue home zyrtec 10 mg daily  - continue azithromycin 250 mg daily and home Clofazimine 100 mg daily (has brought from home)    H/O DVT: continue home Xarelto 10 mg daily    DM:   - Consult endocrine in the morning  - started SSI    GERD: continue home protonix 40 mg daily    Pancreatic Deficiency: continue 7-8 capsules Creon w/ meals and 5-6 capsules with snacks    Anxiety/Depression: continue home Vilazodone 40 mg daily; trazodone 100 mg qHS, Lyrica 300 BID, lamotrigine 100 mg daily, Buspar 30 mg daily, Baclofen 20 mg TID    HTN: continue home lisinopril 10 mg daily, coreg 3.125 mg daily    FEN/GI/PPX:  -- Diet: Regular  -- IVF: not indicated  -- DVT Ppx: not indicated  -- GI: protonix  -- Monitor and replace electrolytes as needed  -- Lines: PIV  -- Code status: Full, confirmed with patient on admission    Code Status:  Full Code  ___________________________________________________________________    Chief Complaint  Chief Complaint   Patient presents with   ??? Cough     Subjective   No acute events overnight    Allergies:  Cayston [aztreonam lysine]; Cefepime; Other; Slo-bid 100; Banana; and Tobramycin    Medications:   Prior to Admission medications    Medication Dose, Route, Frequency   baclofen (LIORESAL) 20 MG tablet 20 mg, Oral, 3 times a day (standard)   albuterol (PROVENTIL HFA;VENTOLIN HFA) 90 mcg/actuation inhaler 2 puffs, Inhalation, Every 6 hours PRN   albuterol 2.5 mg/0.5 mL nebulizer solution 2.5 mg, Nebulization, Every 6 hours PRN   amikacin liposomal-neb.accessr 590 mg/8.4 mL NbSp INHALE 590 MG DAILY   azithromycin (ZITHROMAX) 250 MG tablet 250 mg, Oral, Daily (standard)   blood sugar diagnostic Strp Dispense 150 blood glucose test strips, ok to sub any brand preferred by insurance/patient, use up to 5x/day, dx E 08.9   blood-glucose meter Misc Dispense meter that is preferred by patient's insurance company   budesonide-formoterol (SYMBICORT) 160-4.5 mcg/actuation inhaler 2 puffs, Inhalation, 2 times a day (standard)   busPIRone (BUSPAR) 30 MG tablet 30 mg, Oral,  2 times a day (standard)   carvedilol (COREG) 3.125 MG tablet 3.125 mg, Oral, 2 times a day (standard)   cetirizine (ZYRTEC) 10 MG tablet TAKE 1 TABLET (10 MG TOTAL) BY MOUTH DAILY.   cholecalciferol, vitamin D3, 1,000 unit tablet Oral   CLOFAZIMINE ORAL 100 mg, Oral, Daily (standard)   dornase alfa (PULMOZYME) 1 mg/mL nebulizer solution INHALE 1 VIAL VIA NEBULIZER TWO (2) TIMES A DAY.   fluticasone (FLONASE) 50 mcg/actuation nasal spray 2 sprays, Each Nare, Daily (standard)   insulin ASPART (NOVOLOG FLEXPEN) 100 unit/mL injection pen Use up to 30 units/day, divided TID AC meals as per MD instructions   insulin detemir U-100 (LEVEMIR FLEXTOUCH U-100 INSULN) 100 unit/mL (3 mL) injection pen Use up to 30 units per day, as per MD instructions   Lacto-Bif-Sac-Bacil-Strep-bact (MVW COMPLETE FORMUL PROBIOTIC) 40 billion cell -15 mg CpDR 1 capsule, Oral, Daily (standard)   lamoTRIgine (LAMICTAL) 100 MG tablet 100 mg, Oral, Daily (standard)   lancets Misc Dispense 150 lancets, ok to sub any brand preferred by insurance/patient, use up to 5x/day, dx E08.9   lipase-protease-amylase (CREON) 36,000-114,000- 180,000 unit CpDR TAKE 7-8 CAPSULES BY MOUTH WITH MEALS AND 5-6 CAPSULES WITH SNACKS   lisinopril (PRINIVIL,ZESTRIL) 10 MG tablet TAKE 1 TABLET BY MOUTH EVERY DAY   melatonin 3 mg Tab 6 mg, Oral, Nightly   montelukast (SINGULAIR) 10 mg tablet 10 mg, Oral, Nightly   MVW Complete, pediatric multivit 61-D3-vit K, 1,500-800 unit-mcg cap 1 capsule, Oral, 2 times a day (standard)   omeprazole (PRILOSEC) 40 MG capsule 40 mg, Oral, Daily (standard)  Patient taking differently: Take 20 mg by mouth daily.    pen needle, diabetic (BD ULTRA-FINE NANO PEN NEEDLE) 32 gauge x 5/32 Ndle ok to sub any brand or size needle preferred by insurance/patient, use up to 4x/day, dx E 08.9   pramipexole (MIRAPEX) 0.125 MG tablet 0.25 mg, Oral, Daily (standard)   pregabalin (LYRICA) 300 MG capsule TAKE 2 CAPSULES (300MG ) BY MOUTH TWICE DAILY   rivaroxaban (XARELTO) 20 mg tablet 20 mg, Oral, Daily   sodium bicarb-sodium chloride 700-2,300 mg Pack Irrigate each nostril with 120 mL of solution (1 packet mixed with 240 mL of distilled water) BID    sodium chloride 7% 7 % Nebu 4 mL, Nebulization, 2 times a day (standard)   tezacaftor 100mg /ivacaftor 150mg  and ivacaftor 150mg  (SYMDEKO) tablets TAKE BY MOUTH AS DIRECTED ON PACKAGE LABELING   traMADol (ULTRAM) 50 mg tablet 100 mg, Oral, Every 6 hours PRN   traZODone (DESYREL) 100 MG tablet 100 mg, Oral   vilazodone (VIIBRYD) 40 mg Tab 40 mg, Oral, Daily (standard)       Medical History:  Past Medical History:   Diagnosis Date   ??? Anxiety    ??? Chronic pain disorder    ??? Cystic fibrosis (CMS-HCC)    ??? Depression    ??? Hypertension    ??? Nonproductive cough 04/05/2018       Surgical History:  Past Surgical History:   Procedure Laterality Date   ??? PR REMOVAL OF LUNG,LOBECTOMY Right 03/29/2017    Procedure: REMOVAL OF LUNG, OTHER THAN PNEUMONECTOMY; SINGLE LOBE (LOBECTOMY);  Surgeon: Cherie Dark, MD;  Location: MAIN OR [facility];  Service: Thoracic       Social History:  Tobacco use:   reports that he has never smoked. He has never used smokeless tobacco.  Alcohol use:   reports that he drinks alcohol.  Drug use:  reports that he does not  use drugs.  Living situation: the patient lives with their spouse.    Family History:  Family History   Problem Relation Age of Onset   ??? Bipolar disorder Mother    ??? Depression Mother        Review of Systems:  10 systems reviewed and are negative unless otherwise mentioned in HPI      Physical Exam:  Temp:  [36.4 ??C-36.7 ??C] 36.7 ??C  Heart Rate:  [92-111] 102  Resp:  [16-20] 16  BP: (132-144)/(71-87) 136/83  SpO2:  [93 %-97 %] 96 %  Body mass index is 35.9 kg/m??.    General Appearance: Well appearing. In no acute distress. Overweight.  HEENT: Head is atraumatic and normocephalic. Sclera anicteric without injection. Oropharyngeal membranes are moist with no erythema or exudate.  Neck: Supple.  Lungs: Normal work of breathing. mild coarse breath sounds appreicated bilaterally; worse in the lower lobes, mildly Dyspneic with talking  Heart: RRR, no rubs or gallops, warm and well perfused  Abdomen: Soft, nontender, nondistended.  Extremities: No clubbing, cyanosis, or edema.    Test Results:  Data Review:    All lab results last 24 hours:    Recent Results (from the past 24 hour(s))   Respiratory Pathogen Panel    Collection Time: 04/05/18  5:13 PM   Result Value Ref Range    Adenovirus Not Detected Not Detected    Coronavirus HKU1 Not Detected Not Detected    Coronavirus NL63 Not Detected Not Detected    Coronavirus 229E Not Detected Not Detected    Coronavirus OC43 Not Detected Not Detected    Metapneumovirus Not Detected Not Detected    Rhinovirus/Enterovirus Not Detected Not Detected    Influenza A Not Detected Not Detected    Influenza A/H1 Not Detected Not Detected    Influenza A/H3 Not Detected Not Detected    Influenza A/H1-2009 Not Detected Not Detected    Influenza B Not Detected Not Detected    Parainfluenza 1 Not Detected Not Detected    Parainfluenza 2 Not Detected Not Detected    Parainfluenza 3 Not Detected Not Detected    Parainfluenza 4 Not Detected Not Detected    RSV Not Detected Not Detected    Bordetella pertussis Not Detected Not Detected    Bordetella parapertussis Not Detected Not Detected    Chlamydophila (Chlamydia) pneumoniae Not Detected Not Detected    Mycoplasma pneumoniae Not Detected Not Detected   POCT Glucose    Collection Time: 04/05/18  6:26 PM   Result Value Ref Range    Glucose, POC 392 (H) 65 - 179 mg/dL   POCT Glucose    Collection Time: 04/05/18  9:29 PM   Result Value Ref Range    Glucose, POC 415 (HH) 65 - 179 mg/dL   CBC    Collection Time: 04/06/18  5:24 AM   Result Value Ref Range    WBC 9.2 4.5 - 11.0 10*9/L    RBC 4.97 4.50 - 5.90 10*12/L    HGB 12.8 (L) 13.5 - 17.5 g/dL    HCT 36.6 (L) 44.0 - 53.0 %    MCV 80.2 80.0 - 100.0 fL    MCH 25.7 (L) 26.0 - 34.0 pg    MCHC 32.0 31.0 - 37.0 g/dL    RDW 34.7 (H) 42.5 - 15.0 %    MPV 8.6 7.0 - 10.0 fL    Platelet 235 150 - 440 10*9/L   Comprehensive metabolic panel    Collection Time: 04/06/18  5:24 AM   Result Value Ref Range    Sodium 135 135 - 145 mmol/L    Potassium 4.1 3.5 - 5.0 mmol/L    Chloride 100 98 - 107 mmol/L    CO2 26.0 22.0 - 30.0 mmol/L    BUN 17 7 - 21 mg/dL    Creatinine 1.61 0.96 - 1.30 mg/dL    BUN/Creatinine Ratio 21     EGFR CKD-EPI Non-African American, Male >90 >=60 mL/min/1.65m2    EGFR CKD-EPI African American, Male >90 >=60 mL/min/1.79m2    Glucose 246 (H) 65 - 179 mg/dL Calcium 8.9 8.5 - 04.5 mg/dL    Albumin 3.8 3.5 - 5.0 g/dL    Total Protein 6.4 (L) 6.5 - 8.3 g/dL    Total Bilirubin 0.6 0.0 - 1.2 mg/dL    AST 84 (H) 19 - 55 U/L    ALT 79 (H) 19 - 72 U/L    Alkaline Phosphatase 76 38 - 126 U/L    Anion Gap 9 9 - 15 mmol/L   Magnesium Level    Collection Time: 04/06/18  5:24 AM   Result Value Ref Range    Magnesium 1.5 (L) 1.6 - 2.2 mg/dL   Comprehensive Metabolic Panel    Collection Time: 04/06/18 10:27 AM   Result Value Ref Range    Sodium 132 (L) 135 - 145 mmol/L    Potassium 4.4 3.5 - 5.0 mmol/L    Chloride 97 (L) 98 - 107 mmol/L    CO2 23.0 22.0 - 30.0 mmol/L    BUN 16 7 - 21 mg/dL    Creatinine 4.09 8.11 - 1.30 mg/dL    BUN/Creatinine Ratio 19     EGFR CKD-EPI Non-African American, Male >90 >=60 mL/min/1.100m2    EGFR CKD-EPI African American, Male >90 >=60 mL/min/1.8m2    Glucose 359 (H) 65 - 179 mg/dL    Calcium 9.0 8.5 - 91.4 mg/dL    Albumin 3.9 3.5 - 5.0 g/dL    Total Protein 6.5 6.5 - 8.3 g/dL    Total Bilirubin 0.9 0.0 - 1.2 mg/dL    AST 782 (H) 19 - 55 U/L    ALT 88 (H) 19 - 72 U/L    Alkaline Phosphatase 75 38 - 126 U/L    Anion Gap 12 9 - 15 mmol/L   POCT Glucose    Collection Time: 04/06/18 11:10 AM   Result Value Ref Range    Glucose, POC 364 (H) 65 - 179 mg/dL       Imaging: Radiology studies were personally reviewed    EKG: normal EKG, normal sinus rhythm, unchanged from previous tracings.

## 2018-04-06 NOTE — Unmapped (Signed)
Rivaroxaban Therapeutic Monitoring Pharmacy Note    Christian Young is a 32 y.o. male continuing rivaroxaban.     Indication: deep venous thrombosis (extended therapy)    Prior Dosing Information: Current regimen 20 mg PO daily     Goals:  Therapeutic Drug Levels  Not applicable    Additional Clinical Monitoring/Outcomes  ? Monitor for adequate hemoglobin and platelet counts  ? Monitor for signs/symptoms of bleeding  ? Monitor renal function (SCr and urine output)    Results:  HGB   Date Value Ref Range Status   04/06/2018 12.8 (L) 13.5 - 17.5 g/dL Final   60/45/4098 11.9 13.5 - 17.5 g/dL Final   14/78/2956 21.3 (L) 13.5 - 17.5 g/dL Final     Platelet   Date Value Ref Range Status   04/06/2018 235 150 - 440 10*9/L Final   04/05/2018 310 150 - 440 10*9/L Final   01/08/2018 269 150 - 440 10*9/L Final     Creatinine   Date Value Ref Range Status   04/06/2018 0.83 0.70 - 1.30 mg/dL Final   08/65/7846 9.62 0.70 - 1.30 mg/dL Final   95/28/4132 4.40 (L) 0.70 - 1.30 mg/dL Final     Wt Readings from Last 3 Encounters:   04/05/18 (!) 120.1 kg (264 lb 11.2 oz)   03/17/18 (!) 120.2 kg (265 lb)   12/29/17 (!) 119.2 kg (262 lb 12.6 oz)       Pharmacokinetic Considerations and Significant Drug Interactions: Not applicable    Assessment/Plan:  Recommendation(s)  Continue rivaroxaban at 20 mg PO daily.      Follow-up   Continue to monitor hemoglobin, platelets, signs/symptoms of bleeding, and renal function.    Please page service pharmacist with questions/clarifications.    Garrison Columbus, PharmD

## 2018-04-06 NOTE — Unmapped (Signed)
General Medicine History and Physical    Assessment/Plan:    Principal Problem:    DOE (dyspnea on exertion)  Active Problems:    Essential hypertension    History of DVT (deep vein thrombosis)    Anxiety    Pancreatic insufficiency due to cystic fibrosis (CMS-HCC)    Chronic deep vein thrombosis (DVT) of lower extremity (CMS-HCC)    Cystic fibrosis with pulmonary exacerbation (CMS-HCC)    Nonproductive cough      Christian Young is a 32 y.o. male with PMHx of CF s/p right upper lobectomy in 2008, CF related pancreatic deficiency and DM as well as anxiety and depression who presents to [facility] with DOE (dyspnea on exertion) along with increased shortness of breath on exertion, and chest pain which has been getting over the course of 1-2 months.    Increased DOE w/ non productive cough/CF Exacerbation: Likely a CF exacerbation secondary +/- infection based on timing and nature of symptoms. Other things to keep on the differential are PE since he reports a recent long car trip. However this is less likely as he is on long term anti-coagulation and reports good compliance. We will proceed with IV antibiotics and increase airway clearance and monitor for results.  - Respiratory viral panel pending  - Sputum cultures sent in during last clinic visit- growing pseudomonas only intermediately susceptible to ciprofloxacin, Zosyn and tobramycin.  - in this setting we will start with dual therapy Zosyn and tobramycin  - Airway clearance (vest, aerobika) 4x daily RT  - re-draw AFB (last one during clinic was negative)  - Continue nebs/inhalers- symbicort, Buspar, pulmozyme, hypertonic saline,   - Continue singulair 10 mg qHS, Symdeko,   - Continue home zyrtec 10 mg daily  - continue azithromycin 250 mg daily  - Continue home Clofazimine 100 mg daily (has brought from home)    H/O DVT: continue home Xarelto 10 mg daily    DM:   - Consult endocrine in the morning  - started SSI    GERD: continue home protonix 40 mg daily Pancreatic Deficiency: continue 7-8 capsules Creon w/ meals and 5-6 capsules with snacks    Anxiety/Depression: continue home Vilazodone 40 mg daily; trazodone 100 mg qHS, Lyrica 300 BID, lamotrigine 100 mg daily, Buspar 30 mg daily, Baclofen 20 mg TID    HTN: continue home lisinopril 10 mg daily, coreg 3.125 mg daily      Code Status:  Full Code  ___________________________________________________________________    Chief Complaint  Chief Complaint   Patient presents with   ??? Cough       HPI:  Christian Young is a 32 y.o. male with PMHx of CF s/p right upper lobectomy in 2008, CF related pancreatic deficiency and DM as well as anxiety and depression who presents to [facility] with DOE (dyspnea on exertion) along with increased shortness of breath on exertion, and chest pain which has been getting over the course of 1-2 months. He denies hemoptysis though states he has not been able to cough up any phlegm. He has seen some pink tinged nasal discharge when blowing his nose, but denies any obvious clots. He also denies any nausea, vomiting, or diarrhea. Patient states he and his wife recently lost their insurance and he has been trying to be compliant with his CF medications, however he has noticed an increase in his SOB and feels wiped out after even getting up to get a glass of water. He was trying to wait until  September to come to the hospital since that is when he has insurance again, but could not feel he could wait any longer. He had presented to the ED 7/18 for similar symptoms and was given levaquin and was discharged. He said this helped for a bit, but his symptoms never ceased and just got worse.     Of note he also recently came back from a wedding in [place] (~15 hour drive) and several family members there were sicks. He stated his wife was sick with an URI illness once they got home and he feels he may have caught something from there. The long drive is also concerning for recurrence of DVT as he has a history, though he is currently on prophylaxis with xarelto.     He was seen in clinic with Dr. Pershing Proud on 03/17/18 where he endorsed being complaint with his airway clearance devices. He uses vest therapy two times a day at home. He has an [place] but states it is broken.    Allergies:  Cayston [aztreonam lysine]; Cefepime; Other; Slo-bid 100; Banana; and Tobramycin    Medications:   Prior to Admission medications    Medication Dose, Route, Frequency   baclofen (LIORESAL) 20 MG tablet 20 mg, Oral, 3 times a day (standard)   albuterol (PROVENTIL HFA;VENTOLIN HFA) 90 mcg/actuation inhaler 2 puffs, Inhalation, Every 6 hours PRN   albuterol 2.5 mg/0.5 mL nebulizer solution 2.5 mg, Nebulization, Every 6 hours PRN   amikacin liposomal-neb.accessr 590 mg/8.4 mL NbSp INHALE 590 MG DAILY   azithromycin (ZITHROMAX) 250 MG tablet 250 mg, Oral, Daily (standard)   blood sugar diagnostic Strp Dispense 150 blood glucose test strips, ok to sub any brand preferred by insurance/patient, use up to 5x/day, dx E 08.9   blood-glucose meter Misc Dispense meter that is preferred by patient's insurance company   budesonide-formoterol (SYMBICORT) 160-4.5 mcg/actuation inhaler 2 puffs, Inhalation, 2 times a day (standard)   busPIRone (BUSPAR) 30 MG tablet 30 mg, Oral, 2 times a day (standard)   carvedilol (COREG) 3.125 MG tablet 3.125 mg, Oral, 2 times a day (standard)   cetirizine (ZYRTEC) 10 MG tablet TAKE 1 TABLET (10 MG TOTAL) BY MOUTH DAILY.   cholecalciferol, vitamin D3, 1,000 unit tablet Oral   CLOFAZIMINE ORAL 100 mg, Oral, Daily (standard)   dornase alfa (PULMOZYME) 1 mg/mL nebulizer solution INHALE 1 VIAL VIA NEBULIZER TWO (2) TIMES A DAY.   fluticasone (FLONASE) 50 mcg/actuation nasal spray 2 sprays, Each Nare, Daily (standard)   insulin ASPART (NOVOLOG FLEXPEN) 100 unit/mL injection pen Use up to 30 units/day, divided TID AC meals as per MD instructions   insulin detemir U-100 (LEVEMIR FLEXTOUCH U-100 INSULN) 100 unit/mL (3 mL) injection pen Use up to 30 units per day, as per MD instructions   Lacto-Bif-Sac-Bacil-Strep-bact (MVW COMPLETE FORMUL PROBIOTIC) 40 billion cell -15 mg CpDR 1 capsule, Oral, Daily (standard)   lamoTRIgine (LAMICTAL) 100 MG tablet 100 mg, Oral, Daily (standard)   lancets Misc Dispense 150 lancets, ok to sub any brand preferred by insurance/patient, use up to 5x/day, dx E08.9   lipase-protease-amylase (CREON) 36,000-114,000- 180,000 unit CpDR TAKE 7-8 CAPSULES BY MOUTH WITH MEALS AND 5-6 CAPSULES WITH SNACKS   lisinopril (PRINIVIL,ZESTRIL) 10 MG tablet TAKE 1 TABLET BY MOUTH EVERY DAY   melatonin 3 mg Tab 6 mg, Oral, Nightly   montelukast (SINGULAIR) 10 mg tablet 10 mg, Oral, Nightly   MVW Complete, pediatric multivit 61-D3-vit K, 1,500-800 unit-mcg cap 1 capsule, Oral, 2 times a day (  standard)   omeprazole (PRILOSEC) 40 MG capsule 40 mg, Oral, Daily (standard)  Patient taking differently: Take 20 mg by mouth daily.    pen needle, diabetic (BD ULTRA-FINE NANO PEN NEEDLE) 32 gauge x 5/32 Ndle ok to sub any brand or size needle preferred by insurance/patient, use up to 4x/day, dx E 08.9   pramipexole (MIRAPEX) 0.125 MG tablet 0.25 mg, Oral, Daily (standard)   pregabalin (LYRICA) 300 MG capsule TAKE 2 CAPSULES (300MG ) BY MOUTH TWICE DAILY   rivaroxaban (XARELTO) 20 mg tablet 20 mg, Oral, Daily   sodium bicarb-sodium chloride 700-2,300 mg Pack Irrigate each nostril with 120 mL of solution (1 packet mixed with 240 mL of distilled water) BID    sodium chloride 7% 7 % Nebu 4 mL, Nebulization, 2 times a day (standard)   tezacaftor 100mg /ivacaftor 150mg  and ivacaftor 150mg  (SYMDEKO) tablets TAKE BY MOUTH AS DIRECTED ON PACKAGE LABELING   traMADol (ULTRAM) 50 mg tablet 100 mg, Oral, Every 6 hours PRN   traZODone (DESYREL) 100 MG tablet 100 mg, Oral   vilazodone (VIIBRYD) 40 mg Tab 40 mg, Oral, Daily (standard)       Medical History:  Past Medical History:   Diagnosis Date   ??? Anxiety    ??? Chronic pain disorder    ??? Cystic fibrosis (CMS-HCC)    ??? Depression    ??? Hypertension    ??? Nonproductive cough 04/05/2018       Surgical History:  Past Surgical History:   Procedure Laterality Date   ??? PR REMOVAL OF LUNG,LOBECTOMY Right 03/29/2017    Procedure: REMOVAL OF LUNG, OTHER THAN PNEUMONECTOMY; SINGLE LOBE (LOBECTOMY);  Surgeon: Cherie Dark, MD;  Location: MAIN OR [facility];  Service: Thoracic       Social History:  Tobacco use:   reports that he has never smoked. He has never used smokeless tobacco.  Alcohol use:   reports that he drinks alcohol.  Drug use:  reports that he does not use drugs.  Living situation: the patient lives with their spouse.    Family History:  Family History   Problem Relation Age of Onset   ??? Bipolar disorder Mother    ??? Depression Mother        Review of Systems:  10 systems reviewed and are negative unless otherwise mentioned in HPI      Physical Exam:  Temp:  [35.8 ??C-36.7 ??C] 36.7 ??C  Heart Rate:  [82-111] 109  Resp:  [18-20] 18  BP: (132-144)/(71-87) 144/87  SpO2:  [94 %-97 %] 94 %  Body mass index is 35.9 kg/m??.    General Appearance: Well appearing. In no acute distress. Overweight.  HEENT: Head is atraumatic and normocephalic. Sclera anicteric without injection. Oropharyngeal membranes are moist with no erythema or exudate.  Neck: Supple.  Lungs: Normal work of breathing. mild coarse breath sounds appreicated bilaterally; worse in the lower lobes, mildly Dyspneic with talking  Heart: RRR, no rubs or gallops, warm and well perfused  Abdomen: Soft, nontender, nondistended.  Extremities: No clubbing, cyanosis, or edema.    Test Results:  Data Review:    All lab results last 24 hours:    Recent Results (from the past 24 hour(s))   ECG 12 lead (Adult)    Collection Time: 04/05/18 10:55 AM   Result Value Ref Range    EKG Systolic BP  mmHg    EKG Diastolic BP  mmHg    EKG Ventricular Rate 94 BPM    EKG Atrial  Rate 94 BPM    EKG P-R Interval 146 ms    EKG QRS Duration 84 ms    EKG Q-T Interval 336 ms    EKG QTC Calculation 420 ms    EKG Calculated P Axis 39 degrees    EKG Calculated R Axis 20 degrees    EKG Calculated T Axis 31 degrees    QTC Fredericia 390 ms   Comprehensive Metabolic Panel    Collection Time: 04/05/18 11:46 AM   Result Value Ref Range    Sodium 132 (L) 135 - 145 mmol/L    Potassium 4.6 3.5 - 5.0 mmol/L    Chloride 97 (L) 98 - 107 mmol/L    CO2 27.0 22.0 - 30.0 mmol/L    BUN 17 7 - 21 mg/dL    Creatinine 1.61 (L) 0.70 - 1.30 mg/dL    BUN/Creatinine Ratio 25     EGFR CKD-EPI Non-African American, Male >90 >=60 mL/min/1.22m2    EGFR CKD-EPI African American, Male >90 >=60 mL/min/1.39m2    Glucose 372 (H) 65 - 179 mg/dL    Calcium 9.6 8.5 - 09.6 mg/dL    Albumin 4.3 3.5 - 5.0 g/dL    Total Protein 7.1 6.5 - 8.3 g/dL    Total Bilirubin 0.5 0.0 - 1.2 mg/dL    AST 62 (H) 19 - 55 U/L    ALT 69 19 - 72 U/L    Alkaline Phosphatase 87 38 - 126 U/L    Anion Gap 8 (L) 9 - 15 mmol/L   CBC w/ Differential    Collection Time: 04/05/18 11:46 AM   Result Value Ref Range    WBC 7.0 4.5 - 11.0 10*9/L    RBC 5.26 4.50 - 5.90 10*12/L    HGB 13.6 13.5 - 17.5 g/dL    HCT 04.5 40.9 - 81.1 %    MCV 79.4 (L) 80.0 - 100.0 fL    MCH 25.9 (L) 26.0 - 34.0 pg    MCHC 32.6 31.0 - 37.0 g/dL    RDW 91.4 (H) 78.2 - 15.0 %    MPV 8.7 7.0 - 10.0 fL    Platelet 310 150 - 440 10*9/L    Neutrophils % 70.1 %    Lymphocytes % 19.3 %    Monocytes % 5.9 %    Eosinophils % 0.4 %    Basophils % 2.2 %    Absolute Neutrophils 4.9 2.0 - 7.5 10*9/L    Absolute Lymphocytes 1.4 (L) 1.5 - 5.0 10*9/L    Absolute Monocytes 0.4 0.2 - 0.8 10*9/L    Absolute Eosinophils 0.0 0.0 - 0.4 10*9/L    Absolute Basophils 0.2 (H) 0.0 - 0.1 10*9/L    Large Unstained Cells 2 0 - 4 %    Microcytosis Slight (A) Not Present    Hypochromasia Slight (A) Not Present       Imaging: Radiology studies were personally reviewed    EKG: normal EKG, normal sinus rhythm, unchanged from previous tracings.    [name], MD  PGY-1  Department of Neurology

## 2018-04-06 NOTE — Unmapped (Signed)
Pt is AAOx4. Endocrine consulted on patient today and insulin regimen adjusted. Pt complained of generalized pain throughout shift in chest and joints. Tylenol not effective. MD made aware. Will continue to monitor.  Problem: Adult Inpatient Plan of Care  Goal: Plan of Care Review  Outcome: Progressing  Goal: Patient-Specific Goal (Individualization)  Outcome: Progressing  Goal: Absence of Hospital-Acquired Illness or Injury  Outcome: Progressing  Goal: Optimal Comfort and Wellbeing  Outcome: Progressing  Goal: Readiness for Transition of Care  Outcome: Progressing  Goal: Rounds/Family Conference  Outcome: Progressing     Problem: Diabetes Comorbidity  Goal: Blood Glucose Level Within Desired Range  Outcome: Progressing     Problem: Hypertension Comorbidity  Goal: Blood Pressure in Desired Range  Outcome: Progressing

## 2018-04-06 NOTE — Unmapped (Signed)
Endocrinology New Consult Note    Requesting Attending Physician :  Riccardo Dubin*  Service Requesting Consult : Pulmonology (MDG)  Primary Care Provider: [facility]  Outpatient Endocrinologist: [facility]    Assessment/Recommendations:    CFRD; poorly controlled on levemir 15u QHS and 12u novolog AC + SSI  Start regimen as below based on 20% increase from home medications:  --Lantus 40u QD- give if NPO  --Lispro 14u 4x daily- hold if NPO  --Standard correction (ACHS)    Repeat A1C pending.    The patient was seen and discussed with Dr. Blima Dessert communicated with primary team.    Thank you for the consult. We will continue to follow patient as needed. Please call/page [phone] with questions or concerns.     Donzetta Starch, MD  [facility] Endocrinology Fellow      History of Present Illness: :    Reason for Consult:   Hyperglycemia    Christian Young is a 32 y.o. male admitted for DOE (dyspnea on exertion). I have been asked to evaluate Christian Young for hyperglycemia.    Lab Results   Component Value Date    A1C 9.0 (H) 01/07/2018       Patient with a hx of CFRD.  States glucoses at home are always 200-300.  With his sliding scale, he takes about 15-20u novolog with each meal.  He takes levemir 15u only at night.  He was Dr. Monia Sabal once early this year for diabetes.  Did not come to his endocrine follow up appointment.      He will be discharged in about 1 week on home antibiotics.      Past Medical History:    Medical History:    Past Medical History:   Diagnosis Date   ??? Anxiety    ??? Chronic pain disorder    ??? Cystic fibrosis (CMS-HCC)    ??? Depression    ??? Hypertension    ??? Nonproductive cough 04/05/2018       Surgical History:    Past Surgical History:   Procedure Laterality Date   ??? PR REMOVAL OF LUNG,LOBECTOMY Right 03/29/2017    Procedure: REMOVAL OF LUNG, OTHER THAN PNEUMONECTOMY; SINGLE LOBE (LOBECTOMY);  Surgeon: Cherie Dark, MD;  Location: MAIN OR [facility];  Service: Thoracic Allergies:    Baruch Goldmann lysine]; Cefepime; Other; Slo-bid 100; Banana; and Tobramycin        Social History:     Social History     Socioeconomic History   ??? Marital status: Married     Spouse name: None   ??? Number of children: None   ??? Years of education: None   ??? Highest education level: None   Occupational History   ??? None   Social Needs   ??? Financial resource strain: None   ??? Food insecurity:     Worry: None     Inability: None   ??? Transportation needs:     Medical: None     Non-medical: None   Tobacco Use   ??? Smoking status: Never Smoker   ??? Smokeless tobacco: Never Used   Substance and Sexual Activity   ??? Alcohol use: Yes     Comment: rare   ??? Drug use: No   ??? Sexual activity: Yes     Partners: Female     Birth control/protection: Condom   Lifestyle   ??? Physical activity:     Days per week: None  Minutes per session: None   ??? Stress: None   Relationships   ??? Social connections:     Talks on phone: None     Gets together: None     Attends religious service: None     Active member of club or organization: None     Attends meetings of clubs or organizations: None     Relationship status: None   Other Topics Concern   ??? None   Social History Narrative   ??? None         Family History:    The patient's family history includes Bipolar disorder in his mother; Depression in his mother..    Code Status:    Full Code    Review of Systems:    A 12 system review of systems was negative except as noted in HPI.    Objective: :    Patient Vitals for the past 8 hrs:   BP Temp Temp src Pulse Resp SpO2   04/06/18 1300 119/65 36.2 ??C Oral 116 18 94 %   04/06/18 0948 ??? ??? ??? 102 16 96 %   04/06/18 0922 136/83 36.7 ??C Oral 104 18 97 %       I/O this shift:  In: 116 [IV Piggyback:116]  Out: -     Physical Exam:    Constitutional - alert, well appearing, no acute distress  ENT -  Sclera anicteric, no lid lag or stare, EOM's intact, mucous membranes moist.  CV - RRR no MRG, S1S2 distinct  Resp - no increased WOB  GI - central obesity  MSK - no obvious joint deformity  Neurological - Mental status - alert, oriented to person, place, and time, normal speech, no focal findings or tremor noted.  Lymph - no pedal edema  Skin - normal coloration and turgor, no rashes  Psych - normal affect    Test Results    Glucose values and all insulin doses over the last 24 hours were personally reviewed.    Data Review  Lab Results   Component Value Date    A1C 9.0 (H) 01/07/2018    A1C 6.1 (H) 11/19/2016    A1C 6.3 (H) 11/18/2016     Lab Results   Component Value Date    CREATININE 0.83 04/06/2018     No results found for: CHOL  No results found for: HDL  No results found for: LDL  No results found for: TRIG  No components found for: CHOLHDL  No components found for: MICROALBUR      Time spent on counseling/coordination of care: 30 Minutes  Total time spent with patient: 30 Minutes

## 2018-04-06 NOTE — Unmapped (Signed)
Pt A&O X 4, VSS and all meds passed as ordered. Pt refused early morning vitals, stating he needed some rest. Labs obtained and sent to lab. Pt on room air. Pt remains on contact precautions. 1st skin check completed. No acute events to report this shift. Continue to monitor and follow POC.  Problem: Adult Inpatient Plan of Care  Goal: Plan of Care Review  Outcome: Progressing  Goal: Patient-Specific Goal (Individualization)  Outcome: Progressing  Goal: Absence of Hospital-Acquired Illness or Injury  Outcome: Progressing  Goal: Optimal Comfort and Wellbeing  Outcome: Progressing  Goal: Readiness for Transition of Care  Outcome: Progressing  Goal: Rounds/Family Conference  Outcome: Progressing     Problem: Diabetes Comorbidity  Goal: Blood Glucose Level Within Desired Range  Outcome: Progressing     Problem: Hypertension Comorbidity  Goal: Blood Pressure in Desired Range  Outcome: Progressing

## 2018-04-07 LAB — TOBRAMYCIN RANDOM
Tobramycin:MCnc:Pt:Ser/Plas:Qn:: 1.1
Tobramycin:MCnc:Pt:Ser/Plas:Qn:: 8.6

## 2018-04-07 LAB — COMPREHENSIVE METABOLIC PANEL
ALBUMIN: 3.8 g/dL (ref 3.5–5.0)
ALKALINE PHOSPHATASE: 76 U/L (ref 38–126)
ALT (SGPT): 93 U/L — ABNORMAL HIGH (ref 19–72)
ANION GAP: 10 mmol/L (ref 9–15)
AST (SGOT): 71 U/L — ABNORMAL HIGH (ref 19–55)
BILIRUBIN TOTAL: 0.5 mg/dL (ref 0.0–1.2)
BLOOD UREA NITROGEN: 13 mg/dL (ref 7–21)
BUN / CREAT RATIO: 17
CALCIUM: 8.5 mg/dL (ref 8.5–10.2)
CHLORIDE: 99 mmol/L (ref 98–107)
CO2: 27 mmol/L (ref 22.0–30.0)
CREATININE: 0.77 mg/dL (ref 0.70–1.30)
EGFR CKD-EPI AA MALE: >90 mL/min/{1.73_m2} (ref >=60)
EGFR CKD-EPI NON-AA MALE: >90 mL/min/{1.73_m2} (ref >=60)
GLUCOSE RANDOM: 243 mg/dL — ABNORMAL HIGH (ref 65–179)
POTASSIUM: 4 mmol/L (ref 3.5–5.0)
PROTEIN TOTAL: 6.5 g/dL (ref 6.5–8.3)

## 2018-04-07 LAB — CBC
HEMOGLOBIN: 12.7 g/dL — ABNORMAL LOW (ref 13.5–17.5)
MEAN CORPUSCULAR HEMOGLOBIN CONC: 33 g/dL (ref 31.0–37.0)
MEAN CORPUSCULAR HEMOGLOBIN: 26.1 pg (ref 26.0–34.0)
MEAN CORPUSCULAR VOLUME: 78.9 fL — ABNORMAL LOW (ref 80.0–100.0)
MEAN PLATELET VOLUME: 8.9 fL (ref 7.0–10.0)
PLATELET COUNT: 250 10*9/L (ref 150–440)
RED BLOOD CELL COUNT: 4.87 10*12/L (ref 4.50–5.90)
RED CELL DISTRIBUTION WIDTH: 15.6 % — ABNORMAL HIGH (ref 12.0–15.0)
WBC ADJUSTED: 5.3 10*9/L (ref 4.5–11.0)

## 2018-04-07 LAB — MEAN CORPUSCULAR HEMOGLOBIN CONC: Lab: 33

## 2018-04-07 LAB — POTASSIUM: Potassium:SCnc:Pt:Ser/Plas:Qn:: 4

## 2018-04-07 NOTE — Unmapped (Signed)
Problem: Infection (Cystic Fibrosis)  Goal: Absence of Infection Signs/Symptoms  Outcome: Progressing     Problem: Respiratory Compromise (Cystic Fibrosis)  Goal: Effective Oxygenation and Ventilation  Outcome: Progressing

## 2018-04-07 NOTE — Unmapped (Signed)
Endocrinology New Consult Note    Requesting Attending Physician :  Aida Puffer, MD  Service Requesting Consult : Pulmonology (MDG)  Primary Care Provider: [facility]  Outpatient Endocrinologist: [facility]    Assessment/Recommendations:    CFRD; poorly controlled on levemir 15u QHS and 12u novolog AC + SSI    Based on continued hyperglycemia, recommended changes as below:    --Lantus 40u QD- give if NPO  --Lispro 20u 4x daily- hold if NPO  --Resistant correction (ACHS)    Repeat A1C 10.7%.    Patient seen and discussed with Dr. Yetta Barre.    Recs communicated with primary team.    Thank you for the consult. We will continue to follow patient as needed. Please call/page [phone] with questions or concerns.     Donzetta Starch, MD  [facility] Endocrinology Fellow      History of Present Illness: :    Reason for Consult:   Hyperglycemia    Christian Young is a 32 y.o. male admitted for DOE (dyspnea on exertion). I have been asked to evaluate Christian Young for hyperglycemia.    Lab Results   Component Value Date    A1C 10.7 (H) 04/06/2018       Patient with a hx of CFRD.  States glucoses at home are always 200-300.  With his sliding scale, he takes about 15-20u novolog with each meal.  He takes levemir 15u only at night.  He was Dr. Monia Sabal once early this year for diabetes.  Did not come to his endocrine follow up appointment.      He will be discharged in about 1 week on home antibiotics.      Interval events: feeling well.  Noted hyperglycemia despite higher insulin doses than at home.      Past Medical History:    Medical History:    Past Medical History:   Diagnosis Date   ??? Anxiety    ??? Chronic pain disorder    ??? Cystic fibrosis (CMS-HCC)    ??? Depression    ??? Hypertension    ??? Nonproductive cough 04/05/2018       Surgical History:    Past Surgical History:   Procedure Laterality Date   ??? PR REMOVAL OF LUNG,LOBECTOMY Right 03/29/2017    Procedure: REMOVAL OF LUNG, OTHER THAN PNEUMONECTOMY; SINGLE LOBE (LOBECTOMY);  Surgeon: Cherie Dark, MD;  Location: MAIN OR [facility];  Service: Thoracic       Allergies:    Baruch Goldmann lysine]; Cefepime; Other; Slo-bid 100; Banana; and Tobramycin        Social History:     Social History     Socioeconomic History   ??? Marital status: Married     Spouse name: None   ??? Number of children: None   ??? Years of education: None   ??? Highest education level: None   Occupational History   ??? None   Social Needs   ??? Financial resource strain: None   ??? Food insecurity:     Worry: None     Inability: None   ??? Transportation needs:     Medical: None     Non-medical: None   Tobacco Use   ??? Smoking status: Never Smoker   ??? Smokeless tobacco: Never Used   Substance and Sexual Activity   ??? Alcohol use: Yes     Comment: rare   ??? Drug use: No   ??? Sexual activity: Yes     Partners: Female  Birth control/protection: Condom   Lifestyle   ??? Physical activity:     Days per week: None     Minutes per session: None   ??? Stress: None   Relationships   ??? Social connections:     Talks on phone: None     Gets together: None     Attends religious service: None     Active member of club or organization: None     Attends meetings of clubs or organizations: None     Relationship status: None   Other Topics Concern   ??? None   Social History Narrative   ??? None         Family History:    The patient's family history includes Bipolar disorder in his mother; Depression in his mother..    Code Status:    Full Code    Review of Systems:    A 12 system review of systems was negative except as noted in HPI.    Objective: :    Patient Vitals for the past 8 hrs:   BP Temp Temp src Pulse Resp SpO2 Weight   04/07/18 0915 ??? ??? ??? 99 18 99 % ???   04/07/18 0607 139/85 36 ??C Oral 89 20 95 % (!) 120.1 kg (264 lb 12.4 oz)       No intake/output data recorded.    Physical Exam:    Constitutional - alert, well appearing, no acute distress  ENT -  Sclera anicteric, no lid lag or stare, EOM's intact, mucous membranes moist.  Skin - normal coloration and turgor, no rashes  Psych - normal affect    Test Results    Glucose values and all insulin doses over the last 24 hours were personally reviewed.    Data Review  Lab Results   Component Value Date    A1C 10.7 (H) 04/06/2018    A1C 9.0 (H) 01/07/2018    A1C 6.1 (H) 11/19/2016     Lab Results   Component Value Date    CREATININE 0.77 04/07/2018     No results found for: CHOL  No results found for: HDL  No results found for: LDL  No results found for: TRIG  No components found for: CHOLHDL  No components found for: MICROALBUR      Time spent on counseling/coordination of care: 30 Minutes  Total time spent with patient: 30 Minutes

## 2018-04-07 NOTE — Unmapped (Signed)
Progress note    24 hour Events: No acute events overnight. He is satting well on RA. Receiving antibiotics through [facility] (admitted with port). Still waiting for AFB and sputum to be collected for culture.     - He has myofacial pain from previous lobectomy- 1x dose tramadol ordered    Assessment/Plan:    Principal Problem:    DOE (dyspnea on exertion)  Active Problems:    Essential hypertension    History of DVT (deep vein thrombosis)    Anxiety    Pancreatic insufficiency due to cystic fibrosis (CMS-HCC)    Chronic deep vein thrombosis (DVT) of lower extremity (CMS-HCC)    Cystic fibrosis with pulmonary exacerbation (CMS-HCC)    Nonproductive cough      Christian Young is a 32 y.o. male with PMHx of CF s/p right upper lobectomy in 2008, CF related pancreatic deficiency and DM as well as anxiety and depression who presents to [facility] with DOE (dyspnea on exertion) along with increased shortness of breath on exertion, and chest pain which has been getting over the course of 1-2 months.    CF Bronchiectasis exacerbation: Stable. On room air. Still waiting for AFB Cx and CF sputum Cx to be collected. Previous sputum cultures sent in during last clinic visit- growing pseudomonas only intermediately susceptible to ciprofloxacin, Zosyn and tobramycin. RPP negative and BCx no growth to date.   - Continue IV tobra, zosyn. Day 3/14  - Airway clearance (vest, aerobika) 4x daily RT  - Continue nebs/inhalers- symbicort, Buspar, pulmozyme, hypertonic saline,   - Continue singulair 10 mg qHS, Symdeko  - Continue home zyrtec 10 mg daily  - continue azithromycin 250 mg daily and home Clofazimine 100 mg daily (has brought from home)  - pending sputum and AFB cultures- still need to be collected    H/O DVT: continue home Xarelto 10 mg daily    DM:   - endocrine following- 30 lantus at night; 14U lispro 4x daily  - SSI    GERD: continue home protonix 40 mg daily    Pancreatic Deficiency: 12 capsules CreonTID Anxiety/Depression: continue home Vilazodone 40 mg daily; trazodone 100 mg qHS, Lyrica 300 BID, lamotrigine 100 mg daily, Buspar 30 mg daily, Baclofen 20 mg TID    HTN: continue home lisinopril 10 mg daily, coreg 3.125 mg daily    FEN/GI/PPX:  -- Diet: Regular  -- IVF: not indicated  -- DVT Ppx: not indicated  -- GI: protonix  -- Monitor and replace electrolytes as needed  -- Lines: PIV  -- Code status: Full, confirmed with patient on admission    Code Status:  Full Code  ___________________________________________________________________    Physical Exam:  Temp:  [36 ??C-36.7 ??C] 36 ??C  Heart Rate:  [89-116] 89  Resp:  [16-20] 20  BP: (119-146)/(65-87) 139/85  SpO2:  [94 %-97 %] 95 %  Body mass index is 35.91 kg/m??.    General Appearance: Well appearing. In no acute distress. Overweight.  HEENT: Head is atraumatic and normocephalic. Sclera anicteric without injection. Oropharyngeal membranes are moist with no erythema or exudate.  Neck: Supple.  Lungs: Normal work of breathing. mild coarse breath sounds appreicated bilaterally; worse in the lower lobes, not Dyspneic with talking  Heart: warm and well perfused  Abdomen: nontender, nondistended.  Extremities: No clubbing, cyanosis, or edema.    Labs/Studies:  Labs and Studies from the last 24hrs per EMR and Reviewed    [name], MD  PGY-1  Department of Neurology

## 2018-04-07 NOTE — Unmapped (Signed)
Pt A/Ox4.  All meds administered per MAR.  Meds tolerable with no reactions.  MD was notified of pt's high glucose.  Scheduled insulin given per MAR.  POC continues.

## 2018-04-07 NOTE — Unmapped (Signed)
Care Management  Initial Transition Planning Assessment                General  Care Manager assessed the patient by : In person interview with patient  Orientation Level: Oriented X4    Contact/Decision Maker        Extended Emergency Contact Information  Primary Emergency Contact: Griffin,Alyson   [place]  Mobile Phone: [phone]  Relation: Spouse    Legal Next of Kin / Guardian / POA / Advance Directives       Advance Directive (Medical Treatment)  Does patient have an advance directive covering medical treatment?: Patient has advance directive covering medical treatment, copy not in chart.  Advance directive covering medical treatment not in Chart:: Copy requested from other (Comment)(patient)  Information provided on advance directive:: Yes  Patient requests assistance:: No    Advance Directive (Mental Health Treatment)  Does patient have an advance directive covering mental health treatment?: Patient does not have advance directive covering mental health treatment.  Reason patient does not have an advance directive covering mental health treatment:: Patient does not wish to complete one at this time.    Patient Information  Lives with: Parent, Spouse/significant other    Type of Residence: Private residence             Support Systems: Spouse, Parent, Family Members    Responsibilities/Dependents at home?: No    Home Care services in place prior to admission?: No                  Equipment Currently Used at Home: none       Currently receiving outpatient dialysis?: No       Financial Information       Need for financial assistance?: Other (Comment)(Pt has been approved for PAP until 03/2019 and he also has applied for CC)       Social Determinants of Health  Social Determinants of Health were addressed in provider documentation.  Please refer to patient history.    Discharge Needs Assessment  Concerns to be Addressed: other (see comments)(TBD)    Clinical Risk Factors:      Barriers to taking medications: N/A    Prior overnight hospital stay or ED visit in last 90 days: Yes    Readmission Within the Last 30 Days: unable to assess    Patient's Choice of Community Agency(s): No pref    Anticipated Changes Related to Illness: other (see comments)(TBD)    Equipment Needed After Discharge: other (see comments)(TBD)    Discharge Facility/Level of Care Needs: other (see comments)(TBD)    Readmission  Risk of Unplanned Readmission Score: UNPLANNED READMISSION SCORE: 23%  Readmitted Within the Last 30 Days?   Patient at risk for readmission?: Yes    Discharge Plan  Screen findings are: (TBD)    Expected Discharge Date: 04/13/18    Expected Transfer from Critical Care:      Patient and/or family were provided with choice of facilities / services that are available and appropriate to meet post hospital care needs?: Yes   List choices in order highest to lowest preferred, if applicable. : No pref    Initial Assessment complete?: Yes  Type of Residence: Mailing Address:  [address]  [address]  [place] [address]  Contacts:    Patient Phone Number: [phone] (home)         Medical Provider(s): [facility]  Reason for Admission: Admitting Diagnosis:  Cystic fibrosis  with pulmonary exacerbation (CMS-HCC) [E84.0]  Past Medical History:   has a past medical history of Anxiety, Chronic pain disorder, Cystic fibrosis (CMS-HCC), Depression, Hypertension, and Nonproductive cough (04/05/2018).  Past Surgical History:   has a past surgical history that includes pr removal of lung,lobectomy (Right, 03/29/2017).   Previous admit date: 12/29/2017    Primary Insurance- Payor: / Pt has completed the CC application  Secondary Insurance ??? None  Prescription Coverage ??? Pt approved for PAP until 03/2019  Preferred Pharmacy - [facility]  [facility] [place], [place] - [address]  [facility] - [place], [place] - [address]    Transportation home: Private vehicle  Level of function prior to admission: Independent

## 2018-04-07 NOTE — Unmapped (Signed)
Pt A&Ox4. VSS on RA. Pt was given all medications per [facility] and tolerated well. Pt was given PRN tramadol for chest pain. Pt reports moderate relief. Pt was seen in PFT lab. Endocrinology consult completed. No further needs assessed. POC continues.   Problem: Adult Inpatient Plan of Care  Goal: Plan of Care Review  Outcome: Progressing  Goal: Patient-Specific Goal (Individualization)  Outcome: Progressing  Goal: Absence of Hospital-Acquired Illness or Injury  Outcome: Progressing  Goal: Optimal Comfort and Wellbeing  Outcome: Progressing  Goal: Readiness for Transition of Care  Outcome: Progressing  Goal: Rounds/Family Conference  Outcome: Progressing     Problem: Diabetes Comorbidity  Goal: Blood Glucose Level Within Desired Range  Outcome: Progressing     Problem: Hypertension Comorbidity  Goal: Blood Pressure in Desired Range  Outcome: Progressing     Problem: Infection (Cystic Fibrosis)  Goal: Absence of Infection Signs/Symptoms  Outcome: Progressing     Problem: Respiratory Compromise (Cystic Fibrosis)  Goal: Effective Oxygenation and Ventilation  Outcome: Progressing

## 2018-04-07 NOTE — Unmapped (Signed)
CF SW visited with Christian Young this morning for support while hospitalized and to address psychosocial stressors.     -continuing to check on behavioral health and connection to [facility] supports once his new insurance is active (as of Sept 1). Christian Young plans to reach out to his previous therapist, Christian Young. He knows how to locate psychiatrists through [employer]. Overall, he believes his symptoms are less severe now that he's back on his Vibryd. He also received Buspar last night and this morning following anxiety r/t his mother's current symptoms and hospitalization. Christian Young denies safety concerns in returning home, but shared that he and his wife, Christian Young, would eventually like to move out of his parents' home and into their own place.    -addressing medication questions under PAP with CF clinic pharmacist, Christian Young, and will f//u with Christian Young (re: his Buspar and Lyrica). Epic message sent to primary pulmonologist re: reordering buspirone at d/c.    -denies need for groceries from clinic food pantry today.    [facility] remains pending. PAP approved.

## 2018-04-07 NOTE — Unmapped (Signed)
Adult Nutrition Consult     Visit Type: MD Consult  Reason for Visit:  Assessment    ASSESSMENT:   HPI & PMH: Per MD note: Christian Young is a 32 y.o. male with PMHx of CF s/p right upper lobectomy in 2008, CF related pancreatic deficiency and DM as well as anxiety and depression who presents to [facility] with DOE (dyspnea on exertion) along with increased shortness of breath on exertion, and chest pain which has been getting over the course of 1-2 months.    Nutrition Hx: Patient reports PO intake <50% of usual x2-3 weeks, r/t no feeling well PTA; Pt reports appetite improved since admission, though not quite up to normal, eating 3 meals per day    Nutritionally Pertinent Meds: cholecalciferol, insulin lantus, insulin lispro, creon, mag-ox, MVW Complete 1 capsule  Labs: Glucose [phone]  Abd/GI: Last BM 8/25       Nutrition Information Pertinent to Cystic Fibrosis:    Outpatient Nutrition History:  Diet: High in calories, fat, salt. Diet evaluated this visit.  At home, 3 meals +2 snacks daily  PO Supplements: none at home, non requested     Diet and CFTR modulators: Prescribed Symdeko (tezacaftor/ivacaftor).   Adequate fat consumed with dose.  GI Medications: none     Enzyme Regimen:  Enzyme brand, (meals/snacks):  Creon 36,000 @ 8/meal and 5/snack provides 2398 units lipase/kg/meal & 81191 (< 15,000) units lipase/kg/day  Stools (steatorrhea): 1-2 daily, not greasy  Stools (constipation): no s/s of constipation  Abdominal pain: None reported    Vitamin Regimen:  CF-specific MVI, dose, compliance: MVW Complete Formulation Softgel D-5000 1 daily  Other vitamins/minerals/herbals: vitamin D at home  Fat-soluble vitamin levels:   Lab Results   Component Value Date/Time    VITAMINA 44.4 11/19/2016 0647     Lab Results   Component Value Date/Time    VITDTOTAL 27.9 03/19/2017 0508        Lab Results   Component Value Date/Time    PT 12.9 (H) 08/19/2017 1340           Patient Lines/Drains/Airways Status    Active Wounds     Name:   Placement date:   Placement time:   Site:   Days:    Surgical Site 03/29/17 Chest Right   03/29/17    1346     373    Surgical Site Axilla Right   ???    ???                    Current nutrition therapy order:   Nutrition Orders          Nutrition Therapy General (Regular) starting at 08/24 1320           Anthropometric Data:  -- Height: 182.9 cm (6')   -- Last recorded weight: (!) 120.1 kg (264 lb 12.4 oz)  -- Admission weight: 120 kg  -- IBW: 80.79 kg  -- Percent IBW: 148%  -- BMI: Body mass index is 35.91 kg/m??.   -- Weight changes this admission:   Last 5 Recorded Weights    04/05/18 1550 04/07/18 0607   Weight: (!) 120.1 kg (264 lb 11.2 oz) (!) 120.1 kg (264 lb 12.4 oz)      -- Weight history PTA: stable  Wt Readings from Last 10 Encounters:   04/07/18 (!) 120.1 kg (264 lb 12.4 oz)   03/17/18 (!) 120.2 kg (265 lb)   12/29/17 (!) 119.2 kg (262 lb 12.6  oz)   10/23/17 (!) 123.9 kg (273 lb 3.2 oz)   10/08/17 (!) 124 kg (273 lb 4.8 oz)   09/11/17 (!) 128.4 kg (283 lb)   08/21/17 (!) 119.9 kg (264 lb 4.8 oz)   06/20/17 (!) 122.5 kg (270 lb)   06/04/17 (!) 120.6 kg (265 lb 14 oz)   04/23/17 (!) 118.4 kg (261 lb)      Nutrition Focused Physical Exam:  Fat Areas Examined  Orbital: No loss  Upper Arm: No loss  Thoracic: No loss      Muscle Areas Examined  Temple: No loss  Clavicle: No loss  Acromion: No loss  Scapular: No loss  Dorsal Hand: No loss  Patellar: No loss  Anterior Thigh: No loss  Posterior Calf: No loss    Nutrition Evaluation  Overall Impressions: No fat loss;No muscle loss (04/07/18 1046)  Nutrition Designation: Obese class II  (BMI 35.00 - 39.99 kg/m2) (04/07/18 1046)     DIAGNOSIS:  Malnutrition Assessment using AND/ASPEN Clinical Characteristics:  Patient does not meet AND/ASPEN criteria for malnutrition at this time (04/07/18 1046)    Assessment:  Cystic Fibrosis Nutrition Category = Outstanding    Current diet is not appropriate for CF. Patient meeting goals for CF weight management. Enzyme dose is within established guidelines. Vitamin prescription is appropriate to reach/maintain optimal fat soluble vitamin levels. Patient may benefit from vitamin K supplementation while on IV antibiotics. Sodium needs for CF met with PO and/or supplement. Patient on CFTR modulator is consuming adequate amounts of fat-containing foods with prescribed medication to optimize absorption.    Goals:  1. Meet estimated daily needs: 2880-3360 (24-28 kcal/kg) kcals while inpatient considering lower activity factor; 144-192 gm pro; 3502 mL free water   Calories estimated using: {24-28 kcal/kg, protein per DRI x 1.5-2, fluid per Holliday Segar  2. Reach/maintain established goals for CF:                Adult - BMI 22kg/m2 for CF females and 23kg/m2 for CF males  3. Normal fat-soluble vitamin levels: Vitamin A, Vitamin E and PT per lab range; Vitamin D 25OH total >30  4. Maintain glucose control. Carbohydrate content of diet should comprise 40-50% of total calorie needs, but carbohydrates are not restricted in this population.    5.  Meet sodium needs for CF    INTERVENTION  1. Recommend check PT, start vitamin K phytonadione 5 mg twice weekly while on IV antibiotics.   2. Recommend CF vitamin regimen:  MVW Complete Formulation gel cap 1 daily  3. Weigh patient twice weekly this admit.  4. Continue remainder of nutrition regimen:  - High Calorie Diet  - enzyme regimen  - acid reducer -omeprazole  - bowel regimen -miralax at home  - insulin regimen     5. Add snacks -Pretzels and grapes afternoon    RD Follow Up Parameters:  1-2 times per week (and more frequent as indicated)     Amalia Greenhouse, MPH, RD, LDN  Pager: [phone]

## 2018-04-07 NOTE — Unmapped (Signed)
Adult Inpatient CF Initial Assessment        Kylyn Sookram is a 32 y.o. male who was admitted for Cystic fibrosis with pulmonary exacerbation (CMS-HCC) [E84.0].    Heart Rate: 99  Respiratory Rate: 18  Bilateral Breath Sounds:  Clear, diminished    Cough/Sputum: How Much?                               Color:    Are they coughing up blood? No                                                       Home medication & frequency :    Albuterol Neb 2.5mg   Albuterol HFA 34mcg/2puffs   Hypertonic Saline 7%  Pulmozyme 2.5mg  Qday  Arakase  Symbicort HFA     Home airway clearance & frequency:    Vest     Times Chosen for Inhaled Medications and Airway Clearance:    0900    Methods tried & found to be ineffective:     Vibralung, Metaneb    Home O2? No                         If yes, what is their home requirement:     Home non-invasive ventilation? No    Airway clearance technique method offered & decided upon:     [place]    Comments:

## 2018-04-08 LAB — CBC
HEMATOCRIT: 36.2 % — ABNORMAL LOW (ref 41.0–53.0)
MEAN CORPUSCULAR HEMOGLOBIN CONC: 31.8 g/dL (ref 31.0–37.0)
MEAN CORPUSCULAR VOLUME: 80.4 fL (ref 80.0–100.0)
MEAN PLATELET VOLUME: 8.9 fL (ref 7.0–10.0)
PLATELET COUNT: 231 10*9/L (ref 150–440)
RED BLOOD CELL COUNT: 4.5 10*12/L (ref 4.50–5.90)
RED CELL DISTRIBUTION WIDTH: 16.1 % — ABNORMAL HIGH (ref 12.0–15.0)
WBC ADJUSTED: 4.7 10*9/L (ref 4.5–11.0)

## 2018-04-08 LAB — COMPREHENSIVE METABOLIC PANEL
ALBUMIN: 3.3 g/dL — ABNORMAL LOW (ref 3.5–5.0)
ALKALINE PHOSPHATASE: 62 U/L (ref 38–126)
ALT (SGPT): 70 U/L (ref 19–72)
ANION GAP: 7 mmol/L — ABNORMAL LOW (ref 9–15)
AST (SGOT): 51 U/L (ref 19–55)
BILIRUBIN TOTAL: 0.4 mg/dL (ref 0.0–1.2)
BLOOD UREA NITROGEN: 14 mg/dL (ref 7–21)
BUN / CREAT RATIO: 18
CALCIUM: 8.3 mg/dL — ABNORMAL LOW (ref 8.5–10.2)
CHLORIDE: 102 mmol/L (ref 98–107)
CO2: 26 mmol/L (ref 22.0–30.0)
CREATININE: 0.76 mg/dL (ref 0.70–1.30)
EGFR CKD-EPI AA MALE: >90 mL/min/{1.73_m2} (ref >=60)
EGFR CKD-EPI NON-AA MALE: >90 mL/min/{1.73_m2} (ref >=60)
GLUCOSE RANDOM: 279 mg/dL — ABNORMAL HIGH (ref 65–179)
POTASSIUM: 4.1 mmol/L (ref 3.5–5.0)
PROTEIN TOTAL: 5.8 g/dL — ABNORMAL LOW (ref 6.5–8.3)

## 2018-04-08 LAB — MEAN CORPUSCULAR HEMOGLOBIN CONC: Lab: 31.8

## 2018-04-08 LAB — ANION GAP: Anion gap 3:SCnc:Pt:Ser/Plas:Qn:: 7 — ABNORMAL LOW

## 2018-04-08 LAB — URIC ACID: Urate:MCnc:Pt:Ser/Plas:Qn:: 3.2 — ABNORMAL LOW

## 2018-04-08 NOTE — Unmapped (Signed)
Pt A&Ox4. VSS on RA. Pt was given all medications per [facility] and tolerated well. BG monitored throughout shift. Pt up to chair during shift and ambulating in room. Pt encouraged to walk in hallway. No further needs assessed. POC continues.   Problem: Adult Inpatient Plan of Care  Goal: Plan of Care Review  Outcome: Progressing  Goal: Patient-Specific Goal (Individualization)  Outcome: Progressing  Goal: Absence of Hospital-Acquired Illness or Injury  Outcome: Progressing  Goal: Optimal Comfort and Wellbeing  Outcome: Progressing  Goal: Readiness for Transition of Care  Outcome: Progressing  Goal: Rounds/Family Conference  Outcome: Progressing     Problem: Diabetes Comorbidity  Goal: Blood Glucose Level Within Desired Range  Outcome: Progressing     Problem: Hypertension Comorbidity  Goal: Blood Pressure in Desired Range  Outcome: Progressing     Problem: Infection (Cystic Fibrosis)  Goal: Absence of Infection Signs/Symptoms  Outcome: Progressing     Problem: Respiratory Compromise (Cystic Fibrosis)  Goal: Effective Oxygenation and Ventilation  Outcome: Progressing

## 2018-04-08 NOTE — Unmapped (Signed)
Aminoglycoside Therapeutic Monitoring Pharmacy Note    Christian Young is a 32 y.o. male continuing tobramycin. Date of therapy initiation: 04/06/18    Indication: CF exacerbation    Prior Dosing Information: Current regimen 250 mg q12h      Goals:  Therapeutic Drug Levels  ?? Trough level: tobramycin <1 mg/L  ?? Peak level: tobramycin 10-14 mg/L    Additional Clinical Monitoring/Outcomes  Renal function, volume status (intake and output)    Results:   ?? Random level: 1.1 mg/L, drawn ~4.75h prior to next infusion (Extrapolated trough: 0.198 mg/L)   ?? Random level: 8.6 mg/L, drawn 1h post-infusion (Extrapolated Peak: 12.11 mg/L)    Wt Readings from Last 1 Encounters:   04/07/18 (!) 120.1 kg (264 lb 12.4 oz)     Lab Results   Component Value Date    CREATININE 0.76 04/08/2018       Pharmacokinetic Considerations and Significant Drug Interactions:  ? Adult (calculated on 8/27): Vd = 12.99 L, ke = 0.343 hr-1  ? Concurrent nephrotoxic meds: Not applicable    Assessment/Plan:  Recommendation(s)  ? Continue current regimen of 250 mg IV q12h  ? Estimated peak and trough on recommended regimen: Not applicable - dosing by level    Follow-up  ? Level due: in 3-5 days  ? A pharmacist will continue to monitor and order levels as appropriate    Please page service pharmacist with questions/clarifications.    Fredrik Rigger, PharmD

## 2018-04-08 NOTE — Unmapped (Signed)
Progress note    24 hour Events: No acute events overnight. Pt is progressing well. Having some c/o diarrhea 2/2 antibiotics- imodium ordered    Assessment/Plan:    Principal Problem:    DOE (dyspnea on exertion)  Active Problems:    Essential hypertension    History of DVT (deep vein thrombosis)    Anxiety    Pancreatic insufficiency due to cystic fibrosis (CMS-HCC)    Chronic deep vein thrombosis (DVT) of lower extremity (CMS-HCC)    Cystic fibrosis with pulmonary exacerbation (CMS-HCC)    Nonproductive cough      Christian Young is a 32 y.o. male with PMHx of CF s/p right upper lobectomy in 2008, CF related pancreatic deficiency and DM as well as anxiety and depression who presents to [facility] with DOE (dyspnea on exertion) along with increased shortness of breath on exertion, and chest pain which has been getting over the course of 1-2 months.    CF Bronchiectasis exacerbation: Stable. On room air. AFB Cx and CF sputum Cx collected- pending results. Previous sputum cultures sent in during last clinic visit- growing pseudomonas only intermediately susceptible to ciprofloxacin, Zosyn and tobramycin. RPP negative and BCx no growth to date.   - Continue IV tobra, zosyn. Day 4/14  - Airway clearance (vest, Waymon Budge) 4x daily RT  - Continue nebs/inhalers- symbicort, Buspar, pulmozyme, hypertonic saline,   - Continue singulair 10 mg qHS, Symdeko  - Continue home zyrtec 10 mg daily  - continue azithromycin 250 mg daily and home Clofazimine 100 mg daily (has brought from home)  - pending sputum and AFB cultures- collected- pending results    H/O DVT: continue home Xarelto 10 mg daily    DM:   - endocrine following- 20 lispro ACHS and 34 U NPH q12hrs  - resistant SSI    GERD: continue home protonix 40 mg daily    Pancreatic Deficiency: 12 capsules CreonTID    Anxiety/Depression: continue home Vilazodone 40 mg daily; trazodone 100 mg qHS, Lyrica 300 BID, lamotrigine 100 mg daily, Buspar 30 mg daily, Baclofen 20 mg TID HTN: continue home lisinopril 10 mg daily, coreg 3.125 mg daily    Chronic myofacial pain 2/2 Lobectomy: continue tramadol PRN for pain    FEN/GI/PPX:  -- Diet: Regular  -- IVF: not indicated  -- DVT Ppx: not indicated  -- GI: protonix  -- Monitor and replace electrolytes as needed  -- Lines: PIV  -- Code status: Full, confirmed with patient on admission    Code Status:  Full Code  ___________________________________________________________________    Physical Exam:  Temp:  [35.9 ??C-36.6 ??C] 35.9 ??C  Heart Rate:  [80-107] 80  Resp:  [18] 18  BP: (118-158)/(59-93) 118/59  SpO2:  [94 %-99 %] 95 %  Body mass index is 35.91 kg/m??.    General Appearance: Well appearing. In no acute distress. Overweight.  HEENT: Head is atraumatic and normocephalic. Sclera anicteric without injection. Oropharyngeal membranes are moist with no erythema or exudate.  Neck: Supple.  Lungs: Normal work of breathing. mild coarse breath sounds appreicated bilaterally; worse in the lower lobes, not Dyspneic with talking  Heart: warm and well perfused  Abdomen: nontender, nondistended.  Extremities: No clubbing, cyanosis, or edema.    Labs/Studies:  Labs and Studies from the last 24hrs per EMR and Reviewed    [name], MD  PGY-1  Department of Neurology

## 2018-04-08 NOTE — Unmapped (Signed)
A/Ox4. VSS.  Denied SOB/discomfort.  Tolerated all meds per MAR.  Contact precaution maintained.  POC continues.      Problem: Adult Inpatient Plan of Care  Goal: Plan of Care Review  Outcome: Progressing  Goal: Patient-Specific Goal (Individualization)  Outcome: Progressing  Goal: Absence of Hospital-Acquired Illness or Injury  Outcome: Progressing  Goal: Optimal Comfort and Wellbeing  Outcome: Progressing  Goal: Readiness for Transition of Care  Outcome: Progressing  Goal: Rounds/Family Conference  Outcome: Progressing     Problem: Diabetes Comorbidity  Goal: Blood Glucose Level Within Desired Range  Outcome: Progressing     Problem: Hypertension Comorbidity  Goal: Blood Pressure in Desired Range  Outcome: Progressing     Problem: Infection (Cystic Fibrosis)  Goal: Absence of Infection Signs/Symptoms  Outcome: Progressing     Problem: Respiratory Compromise (Cystic Fibrosis)  Goal: Effective Oxygenation and Ventilation  Outcome: Progressing

## 2018-04-08 NOTE — Unmapped (Signed)
Problem: Infection (Cystic Fibrosis)  Goal: Absence of Infection Signs/Symptoms  Outcome: Progressing     Problem: Respiratory Compromise (Cystic Fibrosis)  Goal: Effective Oxygenation and Ventilation  Outcome: Progressing

## 2018-04-08 NOTE — Unmapped (Signed)
Endocrinology Follow up Consult Note    Requesting Attending Physician :  Aida Puffer, MD  Service Requesting Consult : Pulmonology (MDG)  Primary Care Provider: [facility]  Outpatient Endocrinologist: [facility]    Assessment/Recommendations:    CFRD; poorly controlled A1C 10.7% on home levemir 15u QHS and 12u novolog AC + SSI    Based on continued hyperglycemia, recommended changes as below:    --d/c Lantus  -- start NPH 34u Q12 hours (give if NPO)  --Lispro 20u 4x daily- hold if NPO  --Resistant correction (ACHS)    Patient seen and discussed with Dr. Yetta Barre.    Recs communicated with primary team.    Thank you for the consult. We will continue to follow patient as needed. Please call/page [phone] with questions or concerns.     Donzetta Starch, MD  [facility] Endocrinology Fellow      History of Present Illness: :    Reason for Consult:   Hyperglycemia    Christian Young is a 32 y.o. male admitted for DOE (dyspnea on exertion). I have been asked to evaluate Christian Young for hyperglycemia.    Lab Results   Component Value Date    A1C 10.7 (H) 04/06/2018       Patient with a hx of CFRD.  States glucoses at home are always 200-300.  With his sliding scale, he takes about 15-20u novolog with each meal.  He takes levemir 15u only at night.  He was Dr. Monia Sabal once early this year for diabetes.  Did not come to his endocrine follow up appointment.      He will be discharged in about 1 week on home antibiotics.      Interval events: No complaints today.  Appetite is good.     Past Medical History:    Medical History:    Past Medical History:   Diagnosis Date   ??? Anxiety    ??? Chronic pain disorder    ??? Cystic fibrosis (CMS-HCC)    ??? Depression    ??? Hypertension    ??? Nonproductive cough 04/05/2018       Surgical History:    Past Surgical History:   Procedure Laterality Date   ??? PR REMOVAL OF LUNG,LOBECTOMY Right 03/29/2017    Procedure: REMOVAL OF LUNG, OTHER THAN PNEUMONECTOMY; SINGLE LOBE (LOBECTOMY); Surgeon: Cherie Dark, MD;  Location: MAIN OR [facility];  Service: Thoracic       Allergies:    Baruch Goldmann lysine]; Cefepime; Other; Slo-bid 100; Banana; and Tobramycin        Social History:     Social History     Socioeconomic History   ??? Marital status: Married     Spouse name: None   ??? Number of children: None   ??? Years of education: None   ??? Highest education level: None   Occupational History   ??? None   Social Needs   ??? Financial resource strain: None   ??? Food insecurity:     Worry: None     Inability: None   ??? Transportation needs:     Medical: None     Non-medical: None   Tobacco Use   ??? Smoking status: Never Smoker   ??? Smokeless tobacco: Never Used   Substance and Sexual Activity   ??? Alcohol use: Yes     Comment: rare   ??? Drug use: No   ??? Sexual activity: Yes     Partners: Female     Birth  control/protection: Condom   Lifestyle   ??? Physical activity:     Days per week: None     Minutes per session: None   ??? Stress: None   Relationships   ??? Social connections:     Talks on phone: None     Gets together: None     Attends religious service: None     Active member of club or organization: None     Attends meetings of clubs or organizations: None     Relationship status: None   Other Topics Concern   ??? None   Social History Narrative   ??? None         Family History:    The patient's family history includes Bipolar disorder in his mother; Depression in his mother..    Code Status:    Full Code    Review of Systems:    A 12 system review of systems was negative except as noted in HPI.    Objective: :    Patient Vitals for the past 8 hrs:   BP Temp Temp src Pulse Resp SpO2   04/08/18 0920 ??? ??? ??? 92 18 98 %   04/08/18 0617 118/59 35.9 ??C Oral 80 18 95 %       No intake/output data recorded.    Physical Exam:    Constitutional - alert, well appearing, no acute distress  ENT -  mucous membranes moist.  Skin - normal coloration and turgor, no rashes  Psych - normal affect  GI- central obesity    Test Results Glucose values and all insulin doses over the last 24 hours were personally reviewed.    Data Review  Lab Results   Component Value Date    A1C 10.7 (H) 04/06/2018    A1C 9.0 (H) 01/07/2018    A1C 6.1 (H) 11/19/2016     Lab Results   Component Value Date    CREATININE 0.76 04/08/2018     No results found for: CHOL  No results found for: HDL  No results found for: LDL  No results found for: TRIG  No components found for: CHOLHDL  No components found for: MICROALBUR      Time spent on counseling/coordination of care: 30 Minutes  Total time spent with patient: 30 Minutes

## 2018-04-09 LAB — PLATELET COUNT: Lab: 239

## 2018-04-09 LAB — COMPREHENSIVE METABOLIC PANEL
ALBUMIN: 3.2 g/dL — ABNORMAL LOW (ref 3.5–5.0)
ALKALINE PHOSPHATASE: 57 U/L (ref 38–126)
ALT (SGPT): 74 U/L — ABNORMAL HIGH (ref 19–72)
ANION GAP: 9 mmol/L (ref 9–15)
AST (SGOT): 54 U/L (ref 19–55)
BILIRUBIN TOTAL: 0.3 mg/dL (ref 0.0–1.2)
BLOOD UREA NITROGEN: 12 mg/dL (ref 7–21)
BUN / CREAT RATIO: 16
CALCIUM: 8.2 mg/dL — ABNORMAL LOW (ref 8.5–10.2)
CO2: 27 mmol/L (ref 22.0–30.0)
CREATININE: 0.73 mg/dL (ref 0.70–1.30)
EGFR CKD-EPI AA MALE: >90 mL/min/{1.73_m2} (ref >=60)
EGFR CKD-EPI NON-AA MALE: >90 mL/min/{1.73_m2} (ref >=60)
GLUCOSE RANDOM: 205 mg/dL — ABNORMAL HIGH (ref 65–179)
POTASSIUM: 4.1 mmol/L (ref 3.5–5.0)
PROTEIN TOTAL: 5.8 g/dL — ABNORMAL LOW (ref 6.5–8.3)
SODIUM: 137 mmol/L (ref 135–145)

## 2018-04-09 LAB — CBC
HEMATOCRIT: 35.8 % — ABNORMAL LOW (ref 41.0–53.0)
HEMOGLOBIN: 11.7 g/dL — ABNORMAL LOW (ref 13.5–17.5)
MEAN CORPUSCULAR HEMOGLOBIN CONC: 32.6 g/dL (ref 31.0–37.0)
MEAN CORPUSCULAR HEMOGLOBIN: 26 pg (ref 26.0–34.0)
MEAN CORPUSCULAR VOLUME: 79.6 fL — ABNORMAL LOW (ref 80.0–100.0)
MEAN PLATELET VOLUME: 9.2 fL (ref 7.0–10.0)
PLATELET COUNT: 239 10*9/L (ref 150–440)
RED CELL DISTRIBUTION WIDTH: 15.6 % — ABNORMAL HIGH (ref 12.0–15.0)
WBC ADJUSTED: 4.5 10*9/L (ref 4.5–11.0)

## 2018-04-09 LAB — ALBUMIN: Albumin:MCnc:Pt:Ser/Plas:Qn:: 3.2 — ABNORMAL LOW

## 2018-04-09 NOTE — Unmapped (Signed)
Pt A&O X 4, VSS and all meds passed as ordered. Pt c/o elbow pain bilaterally which started during the day yesterday. Tramadol given for pain. Pt on room air and no acute events to report. Continue to monitor and follow POC.  Problem: Adult Inpatient Plan of Care  Goal: Plan of Care Review  Outcome: Progressing  Goal: Patient-Specific Goal (Individualization)  Outcome: Progressing  Goal: Absence of Hospital-Acquired Illness or Injury  Outcome: Progressing  Goal: Optimal Comfort and Wellbeing  Outcome: Progressing  Goal: Readiness for Transition of Care  Outcome: Progressing  Goal: Rounds/Family Conference  Outcome: Progressing     Problem: Diabetes Comorbidity  Goal: Blood Glucose Level Within Desired Range  Outcome: Progressing     Problem: Hypertension Comorbidity  Goal: Blood Pressure in Desired Range  Outcome: Progressing     Problem: Infection (Cystic Fibrosis)  Goal: Absence of Infection Signs/Symptoms  Outcome: Progressing     Problem: Respiratory Compromise (Cystic Fibrosis)  Goal: Effective Oxygenation and Ventilation  Outcome: Progressing

## 2018-04-09 NOTE — Unmapped (Signed)
Prior authorization for Lyrica (pregabalin) under the Pharmacy Assistance Program (PAP) is approved through 04/08/18-04/06/19.   Please do not hesitate to contact me if you have any questions or concerns.    Sincerely,     Franchot Heidelberg CPhT  [facility] OPS Specialist  [phone]

## 2018-04-09 NOTE — Unmapped (Signed)
Endocrinology Follow up Consult Note    Requesting Attending Physician :  Aida Puffer, MD  Service Requesting Consult : Pulmonology (MDG)  Primary Care Provider: [facility]  Outpatient Endocrinologist: [facility]    Assessment/Recommendations:    CFRD: poorly controlled A1C 10.7% on home levemir 15u QHS and 12u novolog AC + SSI  Based on continued hyperglycemia, recommended changes as below:  -Continue NPH 34 units q12h (give even when NPO)  -Increase Lispro to 24 units with meals (hold if NPO)  -Continue resistant correction with 4:50>150    Recs communicated with primary team.    Patient discussed with Dr. Yetta Barre.    We will continue to follow patient as needed. Please call/page [phone] with questions or concerns.     Farrel Conners, MD  [facility] Endocrinology Fellow  -----------------------------------------------------------------    Reason for Consult: Hyperglycemia    History of Present Illness:   Christian Young is a 32 y.o. male admitted for DOE (dyspnea on exertion). I have been asked to evaluate Christian Young for hyperglycemia.    Patient with a hx of CFRD.  States glucoses at home are always 200-300.  With his sliding scale, he takes about 15-20u novolog with each meal.  He takes levemir 15u only at night.  He was Dr. Monia Sabal once early this year for diabetes.  Did not come to his endocrine follow up appointment.  He will be discharged in about 1 week on home antibiotics.      Interval history: he reports feeling better. He has no new complaints. He is eating well.     Past Medical History:    Medical History:  Past Medical History:   Diagnosis Date   ??? Anxiety    ??? Chronic pain disorder    ??? Cystic fibrosis (CMS-HCC)    ??? Depression    ??? Hypertension    ??? Nonproductive cough 04/05/2018       Surgical History:  Past Surgical History:   Procedure Laterality Date   ??? PR REMOVAL OF LUNG,LOBECTOMY Right 03/29/2017    Procedure: REMOVAL OF LUNG, OTHER THAN PNEUMONECTOMY; SINGLE LOBE (LOBECTOMY); Surgeon: Cherie Dark, MD;  Location: MAIN OR [facility];  Service: Thoracic       Allergies:  Baruch Goldmann lysine]; Cefepime; Other; Slo-bid 100; Banana; and Tobramycin    Social History:   Social History     Socioeconomic History   ??? Marital status: Married     Spouse name: None   ??? Number of children: None   ??? Years of education: None   ??? Highest education level: None   Occupational History   ??? None   Social Needs   ??? Financial resource strain: None   ??? Food insecurity:     Worry: None     Inability: None   ??? Transportation needs:     Medical: None     Non-medical: None   Tobacco Use   ??? Smoking status: Never Smoker   ??? Smokeless tobacco: Never Used   Substance and Sexual Activity   ??? Alcohol use: Yes     Comment: rare   ??? Drug use: No   ??? Sexual activity: Yes     Partners: Female     Birth control/protection: Condom   Lifestyle   ??? Physical activity:     Days per week: None     Minutes per session: None   ??? Stress: None   Relationships   ??? Social connections:  Talks on phone: None     Gets together: None     Attends religious service: None     Active member of club or organization: None     Attends meetings of clubs or organizations: None     Relationship status: None   Other Topics Concern   ??? None   Social History Narrative   ??? None       Family History:  The patient's family history includes Bipolar disorder in his mother; Depression in his mother..    Code Status:  Full Code    Review of Systems:  A 12 system review of systems was negative except as noted in HPI.    Objective:   Vitals:    04/08/18 2100   BP: 157/88   Pulse: 99   Resp: 18   Temp: 36.5 ??C   SpO2: 95%     Physical Exam:  Constitutional - alert, well appearing, no acute distress  ENT -  mucous membranes moist.  Skin - normal coloration and turgor, no rashes  Psych - normal affect  GI- central obesity    Test Results      Data Review  Lab Results   Component Value Date    A1C 10.7 (H) 04/06/2018    A1C 9.0 (H) 01/07/2018    A1C 6.1 (H) 11/19/2016     Lab Results   Component Value Date    CREATININE 0.73 04/09/2018     No results found for: CHOL  No results found for: HDL  No results found for: LDL  No results found for: TRIG  No components found for: CHOLHDL  No components found for: MICROALBUR      Time spent on counseling/coordination of care: 30 Minutes  Total time spent with patient: 30 Minutes

## 2018-04-09 NOTE — Unmapped (Signed)
Progress note    24 hour Events: No acute events overnight. Pt is progressing well. Having c/o bilateral elbow pain. Began last night with L elbow pain rated 10/10- no erythema or swelling; no signs of infection. 5 mg 1x dose oxy given with good results    Assessment/Plan:    Principal Problem:    DOE (dyspnea on exertion)  Active Problems:    Essential hypertension    History of DVT (deep vein thrombosis)    Anxiety    Pancreatic insufficiency due to cystic fibrosis (CMS-HCC)    Chronic deep vein thrombosis (DVT) of lower extremity (CMS-HCC)    Cystic fibrosis with pulmonary exacerbation (CMS-HCC)    Nonproductive cough    Christian Young is a 32 y.o. male with PMHx of CF s/p right upper lobectomy in 2008, CF related pancreatic deficiency and DM as well as anxiety and depression who presents to [facility] with DOE (dyspnea on exertion) along with increased shortness of breath on exertion, and chest pain which has been getting over the course of 1-2 months.    CF Bronchiectasis exacerbation: Stable. On room air. AFB Cx and CF sputum Cx collected- pending results. Previous sputum cultures sent in during last clinic visit- growing pseudomonas only intermediately susceptible to ciprofloxacin, Zosyn and tobramycin. RPP negative and BCx no growth to date.   - Continue IV tobra, zosyn. Day 5/14  - Airway clearance (vest, aerobika) 4x daily RT  - Continue nebs/inhalers- symbicort, Buspar, pulmozyme, hypertonic saline,   - Continue singulair 10 mg qHS, Symdeko  - Continue home zyrtec 10 mg daily  - continue azithromycin 250 mg daily and home Clofazimine 100 mg daily (has brought from home)  - pending sputum and AFB cultures- collected- oropharyngeal flora isolated    H/O DVT: continue home Xarelto 10 mg daily    DM:   - endocrine following- 24 lispro ACHS and 34 U NPH q12hrs  - resistant SSI scale    Bilateral Elbow Pain: Patient c/o L elbow pain initially on the evening of 8/27. Now c/o bilateral pain- 10/10 and sharp. No redness or swelling. No signs of infection. Denies trauma. Receiving tramadol PRN for myofascial pain but does not think this is covering it. He is requesting something stronger  - Tramadol 50 q4hrs PRN  - Will add oxy 5 mg q6hrs PRN    GERD: continue home protonix 40 mg daily    Pancreatic Deficiency: 12 capsules CreonTID    Anxiety/Depression: continue home Vilazodone 40 mg daily; trazodone 100 mg qHS, Lyrica 300 BID, lamotrigine 100 mg daily, Buspar 30 mg daily, Baclofen 20 mg TID  - will add risperidone at discharge    HTN: continue home lisinopril 10 mg daily, coreg 3.125 mg daily    Chronic myofacial pain 2/2 Lobectomy: continue tramadol PRN for pain    FEN/GI/PPX:  -- Diet: Regular  -- IVF: not indicated  -- DVT Ppx: not indicated  -- GI: protonix  -- Monitor and replace electrolytes as needed  -- Lines: PIV  -- Code status: Full, confirmed with patient on admission    Code Status:  Full Code  ___________________________________________________________________    Physical Exam:  Temp:  [35.9 ??C-36.5 ??C] 36.5 ??C  Heart Rate:  [82-100] 86  Resp:  [18] 18  BP: (125-157)/(75-88) 127/75  SpO2:  [94 %-99 %] 99 %  Body mass index is 35.91 kg/m??.    General Appearance: Well appearing. In no acute distress. Overweight.  HEENT: Head is atraumatic and normocephalic.  Sclera anicteric without injection. Oropharyngeal membranes are moist with no erythema or exudate.  Neck: Supple.  Lungs: Normal work of breathing. mild coarse breath sounds appreicated bilaterally; worse in the lower lobes, not Dyspneic with talking  Heart: warm and well perfused  Abdomen: nontender, nondistended.  Extremities: No clubbing, cyanosis, or edema.    Labs/Studies:  Labs and Studies from the last 24hrs per EMR and Reviewed    [name], MD  PGY-1  Department of Neurology

## 2018-04-09 NOTE — Unmapped (Signed)
Patient did well with doing all of their treatments today. Patient did their airway clearance without complications.

## 2018-04-10 LAB — CBC
HEMOGLOBIN: 11.7 g/dL — ABNORMAL LOW (ref 13.5–17.5)
MEAN CORPUSCULAR HEMOGLOBIN CONC: 31.8 g/dL (ref 31.0–37.0)
MEAN CORPUSCULAR HEMOGLOBIN: 25.4 pg — ABNORMAL LOW (ref 26.0–34.0)
MEAN PLATELET VOLUME: 8.7 fL (ref 7.0–10.0)
PLATELET COUNT: 247 10*9/L (ref 150–440)
RED BLOOD CELL COUNT: 4.59 10*12/L (ref 4.50–5.90)
RED CELL DISTRIBUTION WIDTH: 16 % — ABNORMAL HIGH (ref 12.0–15.0)
WBC ADJUSTED: 5.4 10*9/L (ref 4.5–11.0)

## 2018-04-10 LAB — COMPREHENSIVE METABOLIC PANEL
ALBUMIN: 3.3 g/dL — ABNORMAL LOW (ref 3.5–5.0)
ALKALINE PHOSPHATASE: 58 U/L (ref 38–126)
ALT (SGPT): 83 U/L — ABNORMAL HIGH (ref 19–72)
ANION GAP: 8 mmol/L — ABNORMAL LOW (ref 9–15)
AST (SGOT): 61 U/L — ABNORMAL HIGH (ref 19–55)
BILIRUBIN TOTAL: 0.4 mg/dL (ref 0.0–1.2)
BLOOD UREA NITROGEN: 11 mg/dL (ref 7–21)
BUN / CREAT RATIO: 14
CALCIUM: 8.6 mg/dL (ref 8.5–10.2)
CHLORIDE: 99 mmol/L (ref 98–107)
CREATININE: 0.81 mg/dL (ref 0.70–1.30)
EGFR CKD-EPI AA MALE: >90 mL/min/{1.73_m2} (ref >=60)
GLUCOSE RANDOM: 142 mg/dL (ref 65–179)
POTASSIUM: 4.1 mmol/L (ref 3.5–5.0)
PROTEIN TOTAL: 5.9 g/dL — ABNORMAL LOW (ref 6.5–8.3)
SODIUM: 138 mmol/L (ref 135–145)

## 2018-04-10 LAB — TOBRAMYCIN RANDOM: Tobramycin:MCnc:Pt:Ser/Plas:Qn:: <0.6

## 2018-04-10 LAB — RED CELL DISTRIBUTION WIDTH: Lab: 16 — ABNORMAL HIGH

## 2018-04-10 LAB — ANION GAP: Anion gap 3:SCnc:Pt:Ser/Plas:Qn:: 8 — ABNORMAL LOW

## 2018-04-10 NOTE — Unmapped (Signed)
Progress note    24 hour Events: No acute events overnight. Pt is progressing well. Still having c/o bilateral elbow pain- exam unchanged- still w/o erythema, inflammation or other sign of infection.     - Will likely stay in house until Tuesday due to lack of insurance (new insurance will start 04/13/18)- will be difficult to set up home infusion during the holiday weekend.    Assessment/Plan:    Principal Problem:    DOE (dyspnea on exertion)  Active Problems:    Essential hypertension    History of DVT (deep vein thrombosis)    Anxiety    Pancreatic insufficiency due to cystic fibrosis (CMS-HCC)    Chronic deep vein thrombosis (DVT) of lower extremity (CMS-HCC)    Cystic fibrosis with pulmonary exacerbation (CMS-HCC)    Nonproductive cough    Christian Young is a 32 y.o. male with PMHx of CF s/p right upper lobectomy in 2008, CF related pancreatic deficiency and DM as well as anxiety and depression who presents to [facility] with DOE (dyspnea on exertion) along with increased shortness of breath on exertion, and chest pain which has been getting over the course of 1-2 months.    CF Bronchiectasis exacerbation: Stable. On room air. AFB Cx and CF sputum Cx collected- pending results. Previous sputum cultures sent in during last clinic visit- growing pseudomonas only intermediately susceptible to ciprofloxacin, Zosyn and tobramycin. RPP negative and BCx no growth to date.   - Continue IV tobra, zosyn. Day 6/14  - Airway clearance (vest, aerobika) 4x daily RT  - Continue nebs/inhalers- symbicort, Buspar, pulmozyme, hypertonic saline,   - Continue singulair 10 mg qHS, Symdeko  - Continue home zyrtec 10 mg daily  - continue azithromycin 250 mg daily and home Clofazimine 100 mg daily (has brought from home)  - pending sputum and AFB cultures- all cultures negative to date    H/O DVT: continue home Xarelto 10 mg daily    DM:   - endocrine following- 24 lispro ACHS and 34 U NPH q12hrs  - resistant SSI scale    Bilateral Elbow Pain: Patient c/o L elbow pain initially on the evening of 8/27. Now c/o bilateral pain- 10/10 and sharp. No redness or swelling. No signs of infection. Denies trauma. Receiving tramadol PRN for myofascial pain but does not think this is covering it. States the PRN oxy takes the edge off. Will not send him home with opiates and he is aware of this  - Tramadol 50 q4hrs PRN  - Will add oxy 5 mg q6hrs PRN    GERD: continue home protonix 40 mg daily    Pancreatic Deficiency: 12 capsules CreonTID    Anxiety/Depression: continue home Vilazodone 40 mg daily; trazodone 100 mg qHS, Lyrica 300 BID, lamotrigine 100 mg daily, Buspar 30 mg daily, Baclofen 20 mg TID  - will add risperidone at discharge    HTN: continue home lisinopril 10 mg daily, coreg 3.125 mg daily    Chronic myofacial pain 2/2 Lobectomy: continue tramadol PRN for pain    FEN/GI/PPX:  -- Diet: Regular  -- IVF: not indicated  -- DVT Ppx: not indicated  -- GI: protonix  -- Monitor and replace electrolytes as needed  -- Lines: PIV  -- Code status: Full, confirmed with patient on admission    Code Status:  Full Code  ___________________________________________________________________    Physical Exam:  Temp:  [36.4 ??C-36.9 ??C] 36.9 ??C  Heart Rate:  [86-91] 88  Resp:  [18] 18  BP: (120-140)/(60-83) 120/60  SpO2:  [94 %-99 %] 97 %  Body mass index is 35.88 kg/m??.    General Appearance: Well appearing. In no acute distress. Overweight.  HEENT: Head is atraumatic and normocephalic. Sclera anicteric without injection. Oropharyngeal membranes are moist with no erythema or exudate.  Neck: Supple.  Lungs: Normal work of breathing. Ventilated.  No SOB with talking  Heart: warm and well perfused  Abdomen: nontender, nondistended.  Extremities: No clubbing, cyanosis, or edema.    Labs/Studies:  Labs and Studies from the last 24hrs per EMR and Reviewed    [name], MD  PGY-1  Department of Neurology

## 2018-04-10 NOTE — Unmapped (Signed)
Pt up to chair during the shift. Received prn pain meds for pain management. BS managed with insulin. Pt denied nausea. Will continue to monitor.    Problem: Adult Inpatient Plan of Care  Goal: Plan of Care Review  04/09/2018 2003 by Thomasene Ripple, RN  Flowsheets (Taken 04/09/2018 2003)  Progress: improving  Plan of Care Reviewed With: patient  04/09/2018 2002 by Thomasene Ripple, RN  Outcome: Progressing     Problem: Diabetes Comorbidity  Goal: Blood Glucose Level Within Desired Range  Outcome: Progressing     Problem: Hypertension Comorbidity  Goal: Blood Pressure in Desired Range  04/09/2018 2003 by Thomasene Ripple, RN  Outcome: Progressing  04/09/2018 2002 by Thomasene Ripple, RN  Outcome: Progressing     Problem: Infection (Cystic Fibrosis)  Goal: Absence of Infection Signs/Symptoms  Outcome: Progressing  Intervention: Manage Infection and Prevent Transmission  Flowsheets (Taken 04/09/2018 2003)  Infection Management: aseptic technique maintained     Problem: Respiratory Compromise (Cystic Fibrosis)  Goal: Effective Oxygenation and Ventilation  Outcome: Progressing

## 2018-04-10 NOTE — Unmapped (Signed)
Pt A&O X 4, VSS and all meds passed as ordered. Labs drawn and sent to lab. Pt on room air. Pt c/o pain this shift, oxy and tramadol given.  Pt states tramadol is ineffective but oxy helps. Pt on contact precautions. No acute events to report this shift. Continue to monitor and follow POC.  Problem: Adult Inpatient Plan of Care  Goal: Plan of Care Review  Outcome: Progressing  Goal: Patient-Specific Goal (Individualization)  Outcome: Progressing  Goal: Absence of Hospital-Acquired Illness or Injury  Outcome: Progressing  Goal: Optimal Comfort and Wellbeing  Outcome: Progressing  Goal: Readiness for Transition of Care  Outcome: Progressing  Goal: Rounds/Family Conference  Outcome: Progressing     Problem: Diabetes Comorbidity  Goal: Blood Glucose Level Within Desired Range  Outcome: Progressing     Problem: Hypertension Comorbidity  Goal: Blood Pressure in Desired Range  Outcome: Progressing     Problem: Infection (Cystic Fibrosis)  Goal: Absence of Infection Signs/Symptoms  Outcome: Progressing     Problem: Respiratory Compromise (Cystic Fibrosis)  Goal: Effective Oxygenation and Ventilation  Outcome: Progressing

## 2018-04-11 LAB — COMPREHENSIVE METABOLIC PANEL
ALBUMIN: 3.4 g/dL — ABNORMAL LOW (ref 3.5–5.0)
ALKALINE PHOSPHATASE: 58 U/L (ref 38–126)
ANION GAP: 4 mmol/L — ABNORMAL LOW (ref 9–15)
AST (SGOT): 55 U/L (ref 19–55)
BILIRUBIN TOTAL: 0.4 mg/dL (ref 0.0–1.2)
BUN / CREAT RATIO: 17
CALCIUM: 8.8 mg/dL (ref 8.5–10.2)
CHLORIDE: 99 mmol/L (ref 98–107)
CO2: 32 mmol/L — ABNORMAL HIGH (ref 22.0–30.0)
CREATININE: 0.89 mg/dL (ref 0.70–1.30)
EGFR CKD-EPI AA MALE: >90 mL/min/{1.73_m2} (ref >=60)
EGFR CKD-EPI NON-AA MALE: >90 mL/min/{1.73_m2} (ref >=60)
GLUCOSE RANDOM: 244 mg/dL — ABNORMAL HIGH (ref 65–179)
POTASSIUM: 4.4 mmol/L (ref 3.5–5.0)
PROTEIN TOTAL: 5.8 g/dL — ABNORMAL LOW (ref 6.5–8.3)
SODIUM: 135 mmol/L (ref 135–145)

## 2018-04-11 LAB — CBC
HEMOGLOBIN: 11.3 g/dL — ABNORMAL LOW (ref 13.5–17.5)
MEAN CORPUSCULAR HEMOGLOBIN CONC: 31.7 g/dL (ref 31.0–37.0)
MEAN CORPUSCULAR HEMOGLOBIN: 25.4 pg — ABNORMAL LOW (ref 26.0–34.0)
MEAN CORPUSCULAR VOLUME: 80.2 fL (ref 80.0–100.0)
MEAN PLATELET VOLUME: 8.8 fL (ref 7.0–10.0)
PLATELET COUNT: 258 10*9/L (ref 150–440)
WBC ADJUSTED: 5.3 10*9/L (ref 4.5–11.0)

## 2018-04-11 LAB — ALKALINE PHOSPHATASE: Alkaline phosphatase:CCnc:Pt:Ser/Plas:Qn:: 58

## 2018-04-11 LAB — MEAN CORPUSCULAR VOLUME: Lab: 80.2

## 2018-04-11 MED ORDER — PIPERACILLIN-TAZOBACTAM 4.5 GRAM/100 ML DEXTROSE(ISO-OSM) IV PIGGYBACK
Freq: Four times a day (QID) | INTRAVENOUS | 0 refills | 0 days | Status: CP
Start: 2018-04-11 — End: 2018-05-05

## 2018-04-11 NOTE — Unmapped (Signed)
Pt  ambulated to the bathroom during shift, Complained of pain, but denied nausea. Pt's vital signsis wnl. Nutrition has been adequate as well. Bed is low and locked. Will continue to monitor  Problem: Adult Inpatient Plan of Care  Goal: Plan of Care Review  Outcome: Progressing  Flowsheets (Taken 04/10/2018 1725)  Progress: improving  Plan of Care Reviewed With: patient     Problem: Diabetes Comorbidity  Goal: Blood Glucose Level Within Desired Range  Outcome: Progressing  Intervention: Maintain Glycemic Control  Flowsheets (Taken 04/10/2018 1725)  Glycemic Management: blood glucose monitoring     Problem: Hypertension Comorbidity  Goal: Blood Pressure in Desired Range  Outcome: Progressing     Problem: Respiratory Compromise (Cystic Fibrosis)  Goal: Effective Oxygenation and Ventilation  Outcome: Progressing  Intervention: Promote Airway Secretion Clearance  Flowsheets (Taken 04/10/2018 1725)  Cough And Deep Breathing: done independently per patient     Problem: Pain Acute  Goal: Optimal Pain Control  Outcome: Progressing  Intervention: Develop Pain Management Plan  Flowsheets (Taken 04/10/2018 1725)  Pain Management Interventions: pain management plan reviewed with patient/caregiver     Problem: Venous Thromboembolism  Goal: VTE (Venous Thromboembolism) Symptom Resolution  Outcome: Progressing  Intervention: Prevent or Manage VTE (Venous Thromboembolism)  Flowsheets (Taken 04/10/2018 1725)  Bleeding Precautions: blood pressure closely monitored  VTE Prevention/Management: ambulation promoted; anticoagulation therapy

## 2018-04-11 NOTE — Unmapped (Signed)
Endocrinology Follow up Consult Note    Requesting Attending Physician :  Aida Puffer, MD  Service Requesting Consult : Pulmonology (MDG)  Primary Care Provider: [facility]  Outpatient Endocrinologist: [facility]    Assessment/Recommendations:    CFRD: poorly controlled A1C 10.7% on home levemir 15u QHS and 12u novolog AC + SSI. Glycemic control is better today based on the changes made to insulin regimen yesterday. We will continue current regimen as follows:  -Continue NPH 34 units q12h (give even when NPO)  -Continue Lispro 24 units with meals (hold if NPO)  -Continue resistant correction with 4:50>150    Recs communicated with primary team.    Patient discussed with Dr. Yetta Barre.    We will continue to follow patient as needed. Please call/page [phone] with questions or concerns.     Farrel Conners, MD  [facility] Endocrinology Fellow  -----------------------------------------------------------------    Reason for Consult: Hyperglycemia    History of Present Illness:   Christian Young is a 32 y.o. male admitted for DOE (dyspnea on exertion). I have been asked to evaluate [name] for hyperglycemia.    Patient with a hx of CFRD.  States glucoses at home are always 200-300.  With his sliding scale, he takes about 15-20u novolog with each meal.  He takes levemir 15u only at night.  He was Dr. Monia Sabal once early this year for diabetes.  Did not come to his endocrine follow up appointment.  He will be discharged in about 1 week on home antibiotics.      Interval history: He has no new complaints today.    Past Medical History:    Medical History:  Past Medical History:   Diagnosis Date   ??? Anxiety    ??? Chronic pain disorder    ??? Cystic fibrosis (CMS-HCC)    ??? Depression    ??? Hypertension    ??? Nonproductive cough 04/05/2018       Surgical History:  Past Surgical History:   Procedure Laterality Date   ??? PR REMOVAL OF LUNG,LOBECTOMY Right 03/29/2017    Procedure: REMOVAL OF LUNG, OTHER THAN PNEUMONECTOMY; SINGLE LOBE (LOBECTOMY);  Surgeon: Cherie Dark, MD;  Location: MAIN OR [facility];  Service: Thoracic       Allergies:  Baruch Goldmann lysine]; Cefepime; Other; Slo-bid 100; Banana; and Tobramycin    Social History:   Social History     Socioeconomic History   ??? Marital status: Married     Spouse name: None   ??? Number of children: None   ??? Years of education: None   ??? Highest education level: None   Occupational History   ??? None   Social Needs   ??? Financial resource strain: None   ??? Food insecurity:     Worry: None     Inability: None   ??? Transportation needs:     Medical: None     Non-medical: None   Tobacco Use   ??? Smoking status: Never Smoker   ??? Smokeless tobacco: Never Used   Substance and Sexual Activity   ??? Alcohol use: Yes     Comment: rare   ??? Drug use: No   ??? Sexual activity: Yes     Partners: Female     Birth control/protection: Condom   Lifestyle   ??? Physical activity:     Days per week: None     Minutes per session: None   ??? Stress: None   Relationships   ??? Social connections:  Talks on phone: None     Gets together: None     Attends religious service: None     Active member of club or organization: None     Attends meetings of clubs or organizations: None     Relationship status: None   Other Topics Concern   ??? None   Social History Narrative   ??? None       Family History:  The patient's family history includes Bipolar disorder in his mother; Depression in his mother..    Code Status:  Full Code    Review of Systems:  A 12 system review of systems was negative except as noted in HPI.    Objective:   Vitals:    04/10/18 1400   BP: 120/80   Pulse: 105   Resp: 18   Temp: 36.7 ??C   SpO2: 93%     Physical Exam:  Constitutional - alert, well appearing, no acute distress  ENT -  mucous membranes moist.  Skin - normal coloration and turgor, no rashes  Psych - normal affect  GI- central obesity    Test Results  Data Review  Lab Results   Component Value Date    A1C 10.7 (H) 04/06/2018    A1C 9.0 (H) 01/07/2018    A1C 6.1 (H) 11/19/2016     Lab Results   Component Value Date    CREATININE 0.81 04/10/2018     No results found for: CHOL  No results found for: HDL  No results found for: LDL  No results found for: TRIG  No components found for: CHOLHDL  No components found for: MICROALBUR      Time spent on counseling/coordination of care: 30 Minutes  Total time spent with patient: 30 Minutes

## 2018-04-11 NOTE — Unmapped (Signed)
Patient is resting on room air. He received all inhaled respiratory medications this shift. He was compliant with his airway clearance Waymon Budge). Will continue to monitor.

## 2018-04-11 NOTE — Unmapped (Signed)
Patient tolerated treatments and medications well with nae throughout the shift.  Vs remain wnl.  No sob or dyspnea noted.  Will continue to monitor.   Problem: Adult Inpatient Plan of Care  Goal: Plan of Care Review  Outcome: Ongoing - Unchanged  Goal: Patient-Specific Goal (Individualization)  Outcome: Ongoing - Unchanged  Goal: Absence of Hospital-Acquired Illness or Injury  Outcome: Ongoing - Unchanged  Goal: Optimal Comfort and Wellbeing  Outcome: Ongoing - Unchanged  Goal: Readiness for Transition of Care  Outcome: Ongoing - Unchanged  Goal: Rounds/Family Conference  Outcome: Ongoing - Unchanged     Problem: Diabetes Comorbidity  Goal: Blood Glucose Level Within Desired Range  Outcome: Ongoing - Unchanged     Problem: Hypertension Comorbidity  Goal: Blood Pressure in Desired Range  Outcome: Ongoing - Unchanged     Problem: Infection (Cystic Fibrosis)  Goal: Absence of Infection Signs/Symptoms  Outcome: Ongoing - Unchanged     Problem: Respiratory Compromise (Cystic Fibrosis)  Goal: Effective Oxygenation and Ventilation  Outcome: Ongoing - Unchanged     Problem: Pain Acute  Goal: Optimal Pain Control  Outcome: Ongoing - Unchanged     Problem: Venous Thromboembolism  Goal: VTE (Venous Thromboembolism) Symptom Resolution  Outcome: Ongoing - Unchanged

## 2018-04-11 NOTE — Unmapped (Signed)
Med G Progress note    Assessment/Plan:    Principal Problem:    DOE (dyspnea on exertion)  Active Problems:    Essential hypertension    History of DVT (deep vein thrombosis)    Anxiety    Pancreatic insufficiency due to cystic fibrosis (CMS-HCC)    Chronic deep vein thrombosis (DVT) of lower extremity (CMS-HCC)    Cystic fibrosis with pulmonary exacerbation (CMS-HCC)    Nonproductive cough    Christian Young is a 32 y.o. male with PMHx of CF s/p right upper lobectomy in 2008, CF related pancreatic deficiency and DM as well as anxiety and depression who presents to [facility] with DOE (dyspnea on exertion) along with increased shortness of breath on exertion, and chest pain which has been getting over the course of 1-2 months.    CF Bronchiectasis exacerbation: Stable. On room air. CF Cx + OPF. AFB Cx pending. Previous sputum cultures sent in during last clinic visit- growing pseudomonas only intermediately susceptible to ciprofloxacin, Zosyn and tobramycin. RPP negative and BCx no growth to date.   - Continue IV tobra, zosyn. Day 7/14.  - Airway clearance (vest, aerobika) 4x daily RT  - Continue nebs/inhalers- symbicort, Buspar, pulmozyme, hypertonic saline,   - Continue singulair 10 mg qHS, Symdeko  - Continue home zyrtec 10 mg daily  - continue azithromycin 250 mg daily and home Clofazimine 100 mg daily (has brought from home)  - pending sputum and AFB cultures- all cultures negative to date    H/O DVT: continue home Xarelto 10 mg daily    DM:   - endocrine following  - NPH changed to Lantus 60 units qhs  - Continue Lispro 24 units qAC and add 8 units with snacks  - resistant SSI scale    Bilateral Elbow Pain: Patient c/o L elbow pain initially on the evening of 8/27. Now c/o bilateral pain- 10/10 and sharp. No redness or swelling. No signs of infection. Denies trauma. Receiving tramadol PRN for myofascial pain but does not think this is covering it. States the PRN oxy takes the edge off. Will not send him home with opiates and he is aware of this  - Tramadol 50 q4hrs PRN  - Will add oxy 5 mg q6hrs PRN    GERD: continue home protonix 40 mg daily    Pancreatic Deficiency: 12 capsules CreonTID    Anxiety/Depression: continue home Vilazodone 40 mg daily; trazodone 100 mg qHS, Lyrica 300 BID, lamotrigine 100 mg daily, Buspar 30 mg daily, Baclofen 20 mg TID  - will add risperidone at discharge    HTN: continue home lisinopril 10 mg daily, coreg 3.125 mg daily    Chronic myofacial pain 2/2 Lobectomy: continue tramadol PRN for pain    FEN/GI/PPX:  -- Diet: Regular  -- IVF: not indicated  -- DVT Ppx: not indicated  -- GI: protonix  -- Monitor and replace electrolytes as needed  -- Lines: PIV  -- Code status: Full, confirmed with patient on admission    Code Status:  Full Code  ___________________________________________________________________    Physical Exam:  Temp:  [36.4 ??C-36.7 ??C] 36.4 ??C  Heart Rate:  [89-105] 100  Resp:  [18] 18  BP: (120-145)/(80-83) 144/83  SpO2:  [93 %-98 %] 98 %  Body mass index is 35.88 kg/m??.    General Appearance: Well appearing. In no acute distress. Overweight.  HEENT: Head is atraumatic and normocephalic. Sclera anicteric without injection. Oropharyngeal membranes are moist with no erythema or exudate.  Neck: Supple.  Lungs: CTAB. Normal work of breathing on room air.  Heart: warm and well perfused  Abdomen: nontender, nondistended.  Extremities: No clubbing, cyanosis, or edema.    Labs/Studies:  Labs and Studies from the last 24hrs per EMR and Reviewed

## 2018-04-11 NOTE — Unmapped (Signed)
Patient did well with doing all of their treatments today. Patient did their airway clearance without complications.

## 2018-04-11 NOTE — Unmapped (Signed)
Endocrinology Follow up Consult Note    Requesting Attending Physician :  Aida Puffer, MD  Service Requesting Consult : Pulmonology (MDG)  Primary Care Provider: [facility]  Outpatient Endocrinologist: [facility]    Assessment/Recommendations:    CFRD: poorly controlled A1C 10.7% on home levemir 15u QHS and 12u novolog AC + SSI. Postprandial hyperglycemia noted. Plan is to discharge patient in the next 48 hours. In prep for the discharge, we will recommend trying a potential new home regimen:  -Stop NPH and switch to lantus   -Start Lantus 60 units qhs  -Cover snacks with Lispro 8 units  -Continue Lispro 24 units with regular meals (hold if NPO)  -Continue resistant correction with 4:50>150    Recs communicated with primary team.    Patient discussed with Dr. Yetta Barre.    We will continue to follow patient as needed. Please call/page [phone] with questions or concerns.     Christian Conners, MD  [facility] Endocrinology Fellow  -----------------------------------------------------------------    Reason for Consult: Hyperglycemia    History of Present Illness:   Christian Young is a 32 y.o. male admitted for DOE (dyspnea on exertion). I have been asked to evaluate Christian Young for hyperglycemia.    Patient with a hx of CFRD.  States glucoses at home are always 200-300.  With his sliding scale, he takes about 15-20u novolog with each meal.  He takes levemir 15u only at night.  He was Dr. Monia Sabal once early this year for diabetes.  Did not come to his endocrine follow up appointment.  He will be discharged in about 1 week on home antibiotics.      Interval history: He has no new complaints today. He reports snacking throughout yesterday and eating a steak burger late last night at bedtime. He is agreeable to covering his snacks with additional units of lispro.     Past Medical History:    Medical History:  Past Medical History:   Diagnosis Date   ??? Anxiety    ??? Chronic pain disorder    ??? Cystic fibrosis (CMS-HCC)    ??? Depression    ??? Hypertension    ??? Nonproductive cough 04/05/2018       Surgical History:  Past Surgical History:   Procedure Laterality Date   ??? PR REMOVAL OF LUNG,LOBECTOMY Right 03/29/2017    Procedure: REMOVAL OF LUNG, OTHER THAN PNEUMONECTOMY; SINGLE LOBE (LOBECTOMY);  Surgeon: Cherie Dark, MD;  Location: MAIN OR [facility];  Service: Thoracic       Allergies:  Baruch Goldmann lysine]; Cefepime; Other; Slo-bid 100; Banana; and Tobramycin    Social History:   Social History     Socioeconomic History   ??? Marital status: Married     Spouse name: None   ??? Number of children: None   ??? Years of education: None   ??? Highest education level: None   Occupational History   ??? None   Social Needs   ??? Financial resource strain: None   ??? Food insecurity:     Worry: None     Inability: None   ??? Transportation needs:     Medical: None     Non-medical: None   Tobacco Use   ??? Smoking status: Never Smoker   ??? Smokeless tobacco: Never Used   Substance and Sexual Activity   ??? Alcohol use: Yes     Comment: rare   ??? Drug use: No   ??? Sexual activity: Yes  Partners: Female     Birth control/protection: Condom   Lifestyle   ??? Physical activity:     Days per week: None     Minutes per session: None   ??? Stress: None   Relationships   ??? Social connections:     Talks on phone: None     Gets together: None     Attends religious service: None     Active member of club or organization: None     Attends meetings of clubs or organizations: None     Relationship status: None   Other Topics Concern   ??? None   Social History Narrative   ??? None       Family History:  The patient's family history includes Bipolar disorder in his mother; Depression in his mother..    Code Status:  Full Code    Review of Systems:  A 12 system review of systems was negative except as noted in HPI.    Objective:   Vitals:    04/10/18 2148   BP:    Pulse: 89   Resp: 18   Temp:    SpO2: 98%     Physical Exam:  Constitutional - alert, well appearing, no acute distress  ENT -  mucous membranes moist.  Skin - normal coloration and turgor, no rashes  Psych - normal affect  GI- central obesity    Test Results      Data Review  Lab Results   Component Value Date    A1C 10.7 (H) 04/06/2018    A1C 9.0 (H) 01/07/2018    A1C 6.1 (H) 11/19/2016     Lab Results   Component Value Date    CREATININE 0.89 04/11/2018     No results found for: CHOL  No results found for: HDL  No results found for: LDL  No results found for: TRIG  No components found for: CHOLHDL  No components found for: MICROALBUR      Time spent on counseling/coordination of care: 30 Minutes  Total time spent with patient: 30 Minutes

## 2018-04-11 NOTE — Unmapped (Signed)
Pt  ambulated to the bathroom. Pt stated he had pain, but refused PRN pain med. Denied nausea. Pt's blood sugar under control. Diet has been adequate. Bed is low and locked. Will continue to monitor.    Problem: Adult Inpatient Plan of Care  Goal: Plan of Care Review  Outcome: Progressing  Flowsheets (Taken 04/11/2018 1035)  Progress: improving  Plan of Care Reviewed With: patient     Problem: Diabetes Comorbidity  Goal: Blood Glucose Level Within Desired Range  Outcome: Progressing  Intervention: Maintain Glycemic Control  Flowsheets (Taken 04/11/2018 1035)  Glycemic Management: blood glucose monitoring     Problem: Hypertension Comorbidity  Goal: Blood Pressure in Desired Range  Outcome: Progressing     Problem: Respiratory Compromise (Cystic Fibrosis)  Goal: Effective Oxygenation and Ventilation  Outcome: Progressing  Intervention: Optimize Oxygenation and Ventilation  Flowsheets (Taken 04/11/2018 1035)  Head of Bed (HOB): HOB elevated     Problem: Pain Acute  Goal: Optimal Pain Control  Outcome: Progressing  Intervention: Prevent or Manage Pain  Flowsheets (Taken 04/11/2018 1035)  Sensory Stimulation Regulation: lighting decreased  Sleep/Rest Enhancement: awakenings minimized; regular sleep/rest pattern promoted     Problem: Venous Thromboembolism  Goal: VTE (Venous Thromboembolism) Symptom Resolution  Outcome: Progressing  Intervention: Prevent or Manage VTE (Venous Thromboembolism)  Flowsheets (Taken 04/10/2018 1725)  Bleeding Precautions: blood pressure closely monitored  VTE Prevention/Management: ambulation promoted;anticoagulation therapy

## 2018-04-12 LAB — COMPREHENSIVE METABOLIC PANEL
ALBUMIN: 3.7 g/dL (ref 3.5–5.0)
ALKALINE PHOSPHATASE: 59 U/L (ref 38–126)
ALT (SGPT): 74 U/L — ABNORMAL HIGH (ref 19–72)
ANION GAP: 7 mmol/L — ABNORMAL LOW (ref 9–15)
AST (SGOT): 54 U/L (ref 19–55)
BILIRUBIN TOTAL: 0.4 mg/dL (ref 0.0–1.2)
BLOOD UREA NITROGEN: 13 mg/dL (ref 7–21)
BUN / CREAT RATIO: 14
CALCIUM: 8.9 mg/dL (ref 8.5–10.2)
CHLORIDE: 101 mmol/L (ref 98–107)
CO2: 28 mmol/L (ref 22.0–30.0)
CREATININE: 0.92 mg/dL (ref 0.70–1.30)
EGFR CKD-EPI AA MALE: >90 mL/min/{1.73_m2} (ref >=60)
EGFR CKD-EPI NON-AA MALE: >90 mL/min/{1.73_m2} (ref >=60)
POTASSIUM: 4.5 mmol/L (ref 3.5–5.0)
PROTEIN TOTAL: 6.3 g/dL — ABNORMAL LOW (ref 6.5–8.3)
SODIUM: 136 mmol/L (ref 135–145)

## 2018-04-12 LAB — TOBRAMYCIN RANDOM
Tobramycin:MCnc:Pt:Ser/Plas:Qn:: 8.1
Tobramycin:MCnc:Pt:Ser/Plas:Qn:: <0.6

## 2018-04-12 LAB — CBC
HEMATOCRIT: 36.4 % — ABNORMAL LOW (ref 41.0–53.0)
HEMOGLOBIN: 11.8 g/dL — ABNORMAL LOW (ref 13.5–17.5)
MEAN CORPUSCULAR HEMOGLOBIN CONC: 32.3 g/dL (ref 31.0–37.0)
MEAN CORPUSCULAR HEMOGLOBIN: 25.8 pg — ABNORMAL LOW (ref 26.0–34.0)
MEAN CORPUSCULAR VOLUME: 79.9 fL — ABNORMAL LOW (ref 80.0–100.0)
MEAN PLATELET VOLUME: 9.5 fL (ref 7.0–10.0)
RED BLOOD CELL COUNT: 4.55 10*12/L (ref 4.50–5.90)
RED CELL DISTRIBUTION WIDTH: 15.6 % — ABNORMAL HIGH (ref 12.0–15.0)
WBC ADJUSTED: 5.9 10*9/L (ref 4.5–11.0)

## 2018-04-12 LAB — EGFR CKD-EPI NON-AA MALE: Lab: >90

## 2018-04-12 LAB — MEAN PLATELET VOLUME: Lab: 9.5

## 2018-04-12 MED ORDER — TOBRAMYCIN IVPB IN 100 ML (STANDARD DOSING) (OVF)
Freq: Two times a day (BID) | INTRAVENOUS | 0 refills | 0.00000 days | Status: CP
Start: 2018-04-12 — End: 2018-04-12

## 2018-04-12 MED ORDER — TOBRAMYCIN IVPB IN 100 ML (STANDARD DOSING) (OVF): 250 mg | each | Freq: Two times a day (BID) | 0 refills | 0 days | Status: AC

## 2018-04-12 NOTE — Unmapped (Signed)
Med G Progress note    24 hour Events: No acute events overnight. Still pending dispo- insurance restarting tomorrow- will plan on D/C 9/1     Assessment/Plan:    Principal Problem:    DOE (dyspnea on exertion)  Active Problems:    Essential hypertension    History of DVT (deep vein thrombosis)    Anxiety    Pancreatic insufficiency due to cystic fibrosis (CMS-HCC)    Chronic deep vein thrombosis (DVT) of lower extremity (CMS-HCC)    Cystic fibrosis with pulmonary exacerbation (CMS-HCC)    Nonproductive cough    Christian Young is a 32 y.o. male with PMHx of CF s/p right upper lobectomy in 2008, CF related pancreatic deficiency and DM as well as anxiety and depression who presents to [facility] with DOE (dyspnea on exertion) along with increased shortness of breath on exertion, and chest pain which has been getting over the course of 1-2 months.    CF Bronchiectasis exacerbation: Stable. On room air. CF Cx + OPF. AFB Cx pending. Previous sputum cultures sent in during last clinic visit- growing pseudomonas only intermediately susceptible to ciprofloxacin, Zosyn and tobramycin. RPP negative and BCx no growth to date.   - Continue IV tobra, zosyn. Day 8/14.  - Airway clearance (vest, aerobika) 4x daily RT  - Continue nebs/inhalers- symbicort, Buspar, pulmozyme, hypertonic saline,   - Continue singulair 10 mg qHS, Symdeko  - Continue home zyrtec 10 mg daily  - continue azithromycin 250 mg daily and home Clofazimine 100 mg daily (has brought from home)  - pending sputum and AFB cultures- all cultures negative to date    H/O DVT: continue home Xarelto 10 mg daily    DM:   - endocrine following  - NPH changed to Lantus 60 units qhs  - Continue Lispro 24 units qAC and add 8 units with snacks  - resistant SSI scale    Bilateral Elbow Pain: Patient c/o L elbow pain initially on the evening of 8/27. Now c/o bilateral pain- 10/10 and sharp. No redness or swelling. No signs of infection. Denies trauma. Receiving tramadol PRN for myofascial pain but does not think this is covering it. States the PRN oxy takes the edge off. Will not send him home with opiates and he is aware of this  - Tramadol 50 q4hrs PRN  - Will add oxy 5 mg q6hrs PRN    GERD: continue home protonix 40 mg daily    Pancreatic Deficiency: 12 capsules CreonTID    Anxiety/Depression: continue home Vilazodone 40 mg daily; trazodone 100 mg qHS, Lyrica 300 BID, lamotrigine 100 mg daily, Buspar 30 mg daily, Baclofen 20 mg TID  - will add risperidone at discharge    HTN: continue home lisinopril 10 mg daily, coreg 3.125 mg daily    Chronic myofacial pain 2/2 Lobectomy: continue tramadol PRN for pain    FEN/GI/PPX:  -- Diet: Regular  -- IVF: not indicated  -- DVT Ppx: not indicated  -- GI: protonix  -- Monitor and replace electrolytes as needed  -- Lines: PIV  -- Code status: Full, confirmed with patient on admission    Code Status:  Full Code  ___________________________________________________________________    Physical Exam:  Temp:  [35.9 ??C-36.7 ??C] 35.9 ??C  Heart Rate:  [76-111] 88  Resp:  [16-29] 18  BP: (123-129)/(67-79) 128/67  SpO2:  [94 %-98 %] 95 %  Body mass index is 35.88 kg/m??.    General Appearance: Well appearing. In no acute distress.  Overweight.  HEENT: Head is atraumatic and normocephalic. Sclera anicteric without injection. Oropharyngeal membranes are moist with no erythema or exudate.  Neck: Supple.  Lungs: CTAB. Normal work of breathing on room air.  Heart: warm and well perfused  Abdomen: nontender, nondistended.  Extremities: No clubbing, cyanosis, or edema.    Labs/Studies:  Labs and Studies from the last 24hrs per EMR and Reviewed    Christian Genta, MD  PGY-1  Department of Neurology

## 2018-04-12 NOTE — Unmapped (Signed)
Disciplines requested: Nursing and Home IV Antibiotics    Physician to follow patient's care (the person listed here will be responsible for signing ongoing orders): Christian Young    Start of Care Date: 04/13/18  Estimate completion of therapy: 04/19/18  Home health/infusion please reach out to phone contact (below) 2-3 days prior to end of therapy    **FAX RESULTS TO: **  CF Patient: [phone]    *PHONE CONTACT*:  CF Patient: [phone]    ---------------------------------------------------------------------------------------------------------------------------------------------------------------------------  IV ANTIBIOTIC ORDERS:  -Tobramycin 250mg  IV every 12 hours (Conventional dosing, infuse over 30 minutes)   -zosyn 4.5g IV q6h    Inpatient Start Date of Antibiotics:04/05/18 pm    **DO NOT DRAW DRUG LEVELS FROM PORT**    LAB MONITORING ORDERS:  Start labs on 04/16/18 (Tuesday) b/c of HOLIDAY       Collect on MONDAYS of each week:  CBC + diff  Magnesium  Basic Metabolic Panel  Tobramycin Trough Level (collect prior to START of infusion)   LFT's    Collect on THURSDAYS of each week (only for aminoglycosides)  CBC + diff  Magnesium  Basic Metabolic Panel  Tobramycin Trough Level (collect prior to START of infusion)    TARGET DRUG LEVELS:  -Tobramycin (Conventional Dosing): Target end of infusion peak: 10-14 mcg/mL, trough < 1 mcg/mL      Christian Young, PharmD  MDG Team Pharmacist for April 12, 2018 12:58 PM

## 2018-04-12 NOTE — Unmapped (Signed)
Patient was compliant with all treatments and airway clearance toady without adverse reactions noted.  Will continue treatment plan and monitor.

## 2018-04-12 NOTE — Unmapped (Signed)
Pt is alert and oriented to all spheres. Seen by MDs on rounds this am. Respiratory therapy in to do treatments . Port a cath deaccessed and will be reaccessed this afternoon per protocol. Denies any pain at this time. Continues with IV antibiotics. Eating well at meals. Pt is having blood glucose monitored at bedside and covered with insulin orders. Plan of care reviewed w. Will continue with plan of care.  Problem: Adult Inpatient Plan of Care  Goal: Plan of Care Review  Outcome: Progressing     Problem: Diabetes Comorbidity  Goal: Blood Glucose Level Within Desired Range  Outcome: Progressing     Problem: Hypertension Comorbidity  Goal: Blood Pressure in Desired Range  Outcome: Progressing     Problem: Infection (Cystic Fibrosis)  Goal: Absence of Infection Signs/Symptoms  Outcome: Progressing     Problem: Respiratory Compromise (Cystic Fibrosis)  Goal: Effective Oxygenation and Ventilation  Outcome: Progressing     Problem: Pain Acute  Goal: Optimal Pain Control  Outcome: Progressing     Problem: Venous Thromboembolism  Goal: VTE (Venous Thromboembolism) Symptom Resolution  Outcome: Progressing

## 2018-04-12 NOTE — Unmapped (Signed)
Inhaled medication given with no side effects noted.  Airway clearance donwevia Aerobika.

## 2018-04-12 NOTE — Unmapped (Signed)
Patient tolerated treatments and medications well with nae throughout the night.  Blood glucose remains elevated.  Insulin given.  Tolerated well with nae.  Patient sleeping from about mn on.  No respiratory distress noted will continue to monitor.   Problem: Adult Inpatient Plan of Care  Goal: Plan of Care Review  Outcome: Ongoing - Unchanged  Goal: Patient-Specific Goal (Individualization)  Outcome: Ongoing - Unchanged  Goal: Absence of Hospital-Acquired Illness or Injury  Outcome: Ongoing - Unchanged  Goal: Optimal Comfort and Wellbeing  Outcome: Ongoing - Unchanged  Goal: Readiness for Transition of Care  Outcome: Ongoing - Unchanged  Goal: Rounds/Family Conference  Outcome: Ongoing - Unchanged     Problem: Diabetes Comorbidity  Goal: Blood Glucose Level Within Desired Range  Outcome: Ongoing - Unchanged     Problem: Hypertension Comorbidity  Goal: Blood Pressure in Desired Range  Outcome: Ongoing - Unchanged     Problem: Infection (Cystic Fibrosis)  Goal: Absence of Infection Signs/Symptoms  Outcome: Ongoing - Unchanged     Problem: Respiratory Compromise (Cystic Fibrosis)  Goal: Effective Oxygenation and Ventilation  Outcome: Ongoing - Unchanged     Problem: Pain Acute  Goal: Optimal Pain Control  Outcome: Ongoing - Unchanged     Problem: Venous Thromboembolism  Goal: VTE (Venous Thromboembolism) Symptom Resolution  Outcome: Ongoing - Unchanged

## 2018-04-13 LAB — COMPREHENSIVE METABOLIC PANEL
ALBUMIN: 3.5 g/dL (ref 3.5–5.0)
ALKALINE PHOSPHATASE: 54 U/L (ref 38–126)
ALT (SGPT): 69 U/L (ref 19–72)
ANION GAP: 4 mmol/L — ABNORMAL LOW (ref 9–15)
AST (SGOT): 52 U/L (ref 19–55)
BILIRUBIN TOTAL: 0.3 mg/dL (ref 0.0–1.2)
BLOOD UREA NITROGEN: 15 mg/dL (ref 7–21)
BUN / CREAT RATIO: 18
CALCIUM: 9.1 mg/dL (ref 8.5–10.2)
CHLORIDE: 104 mmol/L (ref 98–107)
CO2: 30 mmol/L (ref 22.0–30.0)
CREATININE: 0.85 mg/dL (ref 0.70–1.30)
EGFR CKD-EPI NON-AA MALE: >90 mL/min/{1.73_m2} (ref >=60)
GLUCOSE RANDOM: 126 mg/dL (ref 65–179)
PROTEIN TOTAL: 6.1 g/dL — ABNORMAL LOW (ref 6.5–8.3)
SODIUM: 138 mmol/L (ref 135–145)

## 2018-04-13 LAB — CBC
HEMOGLOBIN: 11.6 g/dL — ABNORMAL LOW (ref 13.5–17.5)
MEAN CORPUSCULAR HEMOGLOBIN CONC: 31.7 g/dL (ref 31.0–37.0)
MEAN CORPUSCULAR HEMOGLOBIN: 25.4 pg — ABNORMAL LOW (ref 26.0–34.0)
MEAN CORPUSCULAR VOLUME: 80.2 fL (ref 80.0–100.0)
PLATELET COUNT: 297 10*9/L (ref 150–440)
RED BLOOD CELL COUNT: 4.55 10*12/L (ref 4.50–5.90)
RED CELL DISTRIBUTION WIDTH: 16.1 % — ABNORMAL HIGH (ref 12.0–15.0)
WBC ADJUSTED: 6.2 10*9/L (ref 4.5–11.0)

## 2018-04-13 LAB — GLUCOSE RANDOM: Glucose:MCnc:Pt:Ser/Plas:Qn:: 126

## 2018-04-13 LAB — MEAN CORPUSCULAR HEMOGLOBIN: Lab: 25.4 — ABNORMAL LOW

## 2018-04-13 MED ORDER — PEN NEEDLE, DIABETIC 32 GAUGE X 5/32" (4 MM)
12 refills | 0 days | Status: CP
Start: 2018-04-13 — End: 2018-04-25

## 2018-04-13 MED ORDER — TRAMADOL 50 MG TABLET
ORAL_TABLET | Freq: Three times a day (TID) | ORAL | 0 refills | 0 days | Status: CP | PRN
Start: 2018-04-13 — End: 2018-05-05
  Filled 2018-04-13: qty 30, 5d supply, fill #0

## 2018-04-13 MED ORDER — DICLOFENAC 1 % TOPICAL GEL
Freq: Four times a day (QID) | TOPICAL | 2 refills | 0.00000 days | Status: SS
Start: 2018-04-13 — End: 2019-03-27

## 2018-04-13 MED ORDER — INSULIN GLARGINE (U-100) 100 UNIT/ML (3 ML) SUBCUTANEOUS PEN
Freq: Every day | SUBCUTANEOUS | 12 refills | 0.00000 days | Status: CP
Start: 2018-04-13 — End: 2018-04-25

## 2018-04-13 MED ORDER — INSULIN ASPART (U-100) 100 UNIT/ML (3 ML) SUBCUTANEOUS PEN
12 refills | 0 days | Status: CP
Start: 2018-04-13 — End: 2018-04-25
  Filled 2018-04-13: qty 15, 25d supply, fill #0

## 2018-04-13 MED FILL — DICLOFENAC 1 % TOPICAL GEL: TOPICAL | 13 days supply | Qty: 100 | Fill #0

## 2018-04-13 MED FILL — TRAMADOL 50 MG TABLET: 5 days supply | Qty: 30 | Fill #0 | Status: AC

## 2018-04-13 MED FILL — NOVOLOG FLEXPEN U-100 INSULIN ASPART 100 UNIT/ML (3 ML) SUBCUTANEOUS: 33 days supply | Qty: 30 | Fill #0

## 2018-04-13 MED FILL — NOVOLOG FLEXPEN U-100 INSULIN ASPART 100 UNIT/ML (3 ML) SUBCUTANEOUS: 33 days supply | Qty: 30 | Fill #0 | Status: AC

## 2018-04-13 MED FILL — DICLOFENAC 1 % TOPICAL GEL: 13 days supply | Qty: 100 | Fill #0 | Status: AC

## 2018-04-13 MED FILL — LANTUS SOLOSTAR U-100 INSULIN 100 UNIT/ML (3 ML) SUBCUTANEOUS PEN: 25 days supply | Qty: 15 | Fill #0 | Status: AC

## 2018-04-13 MED FILL — UNIFINE PENTIPS 32 GAUGE X 5/32" NEEDLE: 25 days supply | Qty: 100 | Fill #0

## 2018-04-13 MED FILL — UNIFINE PENTIPS 32 GAUGE X 5/32" NEEDLE: 25 days supply | Qty: 100 | Fill #0 | Status: AC

## 2018-04-13 NOTE — Unmapped (Signed)
Patient was compliant with all treatments and airway clearance today. No adverse reactions were noted. Will continue to monitor.

## 2018-04-13 NOTE — Unmapped (Signed)
Endocrinology Follow up Consult Note    Requesting Attending Physician :  Aida Puffer, MD  Service Requesting Consult : Pulmonology ([facility])  Primary Care Provider: [facility]  Outpatient Endocrinologist: [facility]    Assessment/Recommendations:    CFRD: poorly controlled A1C 10.7% on home levemir 15u QHS and 12u novolog AC + SSI. Postprandial hyperglycemia noted. Plan is to discharge patient in the next 24 hours. In prep for the discharge, we will recommend trying a potential new home regimen:  -Continue Lantus 60 units qhs  -Increase Lispro to 30 units TIDAC (hold if NPO)  -Cover snacks with Lispro 8 units  -Continue resistant correction with 4:50>150    Recs communicated with primary team.    Patient was discussed and seen with Dr. Tiburcio Pea.    We will continue to follow patient as needed. Please call/page [phone] with questions or concerns.     Farrel Conners, MD  [facility] Endocrinology Fellow  -----------------------------------------------------------------    Reason for Consult: Hyperglycemia    History of Present Illness:   Christian Young is a 32 y.o. male admitted for DOE (dyspnea on exertion). I have been asked to evaluate Christian Young for hyperglycemia.    Patient with a hx of CFRD.  States glucoses at home are always 200-300.  With his sliding scale, he takes about 15-20u novolog with each meal.  He takes levemir 15u only at night.  He was Dr. Monia Sabal once early this year for diabetes.  Did not come to his endocrine follow up appointment.  He will be discharged in about 1 week on home antibiotics.      Interval history: He has no new complaints today. He still reports snacking throughout day and late last night at bedtime. Plan is to DC home tomorrow.    Past Medical History:    Medical History:  Past Medical History:   Diagnosis Date   ??? Anxiety    ??? Chronic pain disorder    ??? Cystic fibrosis (CMS-HCC)    ??? Depression    ??? Hypertension    ??? Nonproductive cough 04/05/2018       Surgical History:  Past Surgical History:   Procedure Laterality Date   ??? PR REMOVAL OF LUNG,LOBECTOMY Right 03/29/2017    Procedure: REMOVAL OF LUNG, OTHER THAN PNEUMONECTOMY; SINGLE LOBE (LOBECTOMY);  Surgeon: Cherie Dark, MD;  Location: MAIN OR [facility];  Service: Thoracic       Allergies:  Baruch Goldmann lysine]; Cefepime; Other; Slo-bid 100; Banana; and Tobramycin    Social History:   Social History     Socioeconomic History   ??? Marital status: Married     Spouse name: None   ??? Number of children: None   ??? Years of education: None   ??? Highest education level: None   Occupational History   ??? None   Social Needs   ??? Financial resource strain: None   ??? Food insecurity:     Worry: None     Inability: None   ??? Transportation needs:     Medical: None     Non-medical: None   Tobacco Use   ??? Smoking status: Never Smoker   ??? Smokeless tobacco: Never Used   Substance and Sexual Activity   ??? Alcohol use: Yes     Comment: rare   ??? Drug use: No   ??? Sexual activity: Yes     Partners: Female     Birth control/protection: Condom   Lifestyle   ??? Physical  activity:     Days per week: None     Minutes per session: None   ??? Stress: None   Relationships   ??? Social connections:     Talks on phone: None     Gets together: None     Attends religious service: None     Active member of club or organization: None     Attends meetings of clubs or organizations: None     Relationship status: None   Other Topics Concern   ??? None   Social History Narrative   ??? None       Family History:  The patient's family history includes Bipolar disorder in his mother; Depression in his mother..    Code Status:  Full Code    Review of Systems:  A 12 system review of systems was negative except as noted in HPI.    Objective:   Vitals:    04/12/18 1400   BP: 128/71   Pulse: 98   Resp: 18   Temp: 36.6 ??C   SpO2: 91%     Physical Exam:  Constitutional - alert, well appearing, no acute distress  ENT -  mucous membranes moist.  Skin - normal coloration and turgor, no rashes  Psych - normal affect  GI- central obesity    Test Results      Data Review  Lab Results   Component Value Date    A1C 10.7 (H) 04/06/2018    A1C 9.0 (H) 01/07/2018    A1C 6.1 (H) 11/19/2016     Lab Results   Component Value Date    CREATININE 0.92 04/12/2018       Time spent on counseling/coordination of care: 30 Minutes  Total time spent with patient: 30 Minutes

## 2018-04-13 NOTE — Unmapped (Signed)
Christian Young is a 32 y.o. male with PMHx of CF s/p right upper lobectomy in 2008, CF related pancreatic deficiency and DM as well as anxiety and depression who presents to [facility] with DOE (dyspnea on exertion) along with increased shortness of breath on exertion, and chest pain which has been getting over the course of 1-2 months.  ??  Increased DOE w/ non productive cough/CF Exacerbation: Patient was admitted wih increased DOE, and chest pain which has been getting over the course of 1-2 months. He lost insurance coverage and was trying to avoid additional medical care, but felt he could not wait any longer. His symptoms were likely due to a CF exacerbation +/- infection based on timing and nature of symptoms. We considered a PE since he reported a recent long car trip. However this is less likely as he is on long term anti-coagulation and reports good compliance. He was treated with IV Zosyn and tobramycin as well as mechanical clearance and his nebs/inhalers/meds (symbicort, Buspar, pulmozyme, hypertonic saline, singulair 10 mg qHS, Symdeko). Repeat AFB was negative and CF sputum cultures were negative- though last cultures from clinic grew pseudomonas only intermediately susceptible to ciprofloxacin, Zosyn and tobramycin. We continued other home meds including zyrtec, azithromycin and clofazimine. He improved rapidly and was discharged home 9/1 with home infusion to continue IV abx for 14 days total.  ??  H/O DVT: continue home Xarelto 10 mg daily  ??  DM: Patient has a h/o difficult to control sugars. He was placed on resistant scale SSI along with 60 U lantus nightly, 30 U Lispro TIDAC and 8 U with snacks. He was discharged home with 60U lantus qHS and 30U TIDAC with endocrine follow-up.  ??  GERD: continue home protonix 40 mg daily  ??  Pancreatic Deficiency: continue 7-8 capsules Creon w/ meals and 5-6 capsules with snacks  ??  Anxiety/Depression: continue home Vilazodone 40 mg daily; trazodone 100 mg qHS, Lyrica 300 BID, lamotrigine 100 mg daily, Buspar 30 mg daily, Baclofen 20 mg TID  ??  HTN: continue home lisinopril 10 mg daily, coreg 3.125 mg daily    Bilateral Elbow Pain/Chronic Myofacial Pain 2/2 Lobectomy: He has a h/o myofacial pain usually controlled by home tramadol. On this admission, patient c/o L elbow pain initially on the evening of 8/27 which progressed to bilateral pain- 10/10 and sharp. No redness or swelling. No signs of infection. Denies trauma. Receiving home tramadol PRN for myofascial pain but did not think this was enough. He was started on 5 mg Oxy q6hrs PRN but was not sent home with any opioids.

## 2018-04-13 NOTE — Unmapped (Signed)
Patient compliant with all inhaled medications and airway clearance. No signs of distress noted.

## 2018-04-13 NOTE — Unmapped (Signed)
Patient tolerated treatments and mediations well with nae throughout the shift. Had no complaint of pain. No sob or dyspnea. Will continue to mointor.  Problem: Adult Inpatient Plan of Care  Goal: Plan of Care Review  Outcome: Ongoing - Unchanged  Goal: Patient-Specific Goal (Individualization)  Outcome: Ongoing - Unchanged  Goal: Absence of Hospital-Acquired Illness or Injury  Outcome: Ongoing - Unchanged  Goal: Optimal Comfort and Wellbeing  Outcome: Ongoing - Unchanged  Goal: Readiness for Transition of Care  Outcome: Ongoing - Unchanged  Goal: Rounds/Family Conference  Outcome: Ongoing - Unchanged     Problem: Diabetes Comorbidity  Goal: Blood Glucose Level Within Desired Range  Outcome: Ongoing - Unchanged     Problem: Hypertension Comorbidity  Goal: Blood Pressure in Desired Range  Outcome: Ongoing - Unchanged     Problem: Infection (Cystic Fibrosis)  Goal: Absence of Infection Signs/Symptoms  Outcome: Ongoing - Unchanged     Problem: Respiratory Compromise (Cystic Fibrosis)  Goal: Effective Oxygenation and Ventilation  Outcome: Ongoing - Unchanged     Problem: Pain Acute  Goal: Optimal Pain Control  Outcome: Ongoing - Unchanged     Problem: Venous Thromboembolism  Goal: VTE (Venous Thromboembolism) Symptom Resolution  Outcome: Ongoing - Unchanged

## 2018-04-13 NOTE — Unmapped (Signed)
Home Infusion Agency  [facility]   Ph:  [phone].  Start of home infusion on Sunday 04/13/18

## 2018-04-13 NOTE — Unmapped (Signed)
Physician Discharge Summary [facility]  [address] [facility]  [address]  [place] [address]  Dept: [phone]  Loc: [phone]     Identifying Information:   Eddi Hymes  April 12, 1986  [id number]    Primary Care Physician: [facility]   Code Status: Full Code    Admit Date: 04/05/2018    Discharge Date: 04/13/2018     Discharge To: Home with Home Health and/or PT/OT    Discharge Service: MDG - Pulmonary Admit     Discharge Attending Physician: No att. providers found    Discharge Diagnoses:  Principal Problem:    DOE (dyspnea on exertion)  Active Problems:    Essential hypertension    History of DVT (deep vein thrombosis)    Anxiety    Pancreatic insufficiency due to cystic fibrosis (CMS-HCC)    Chronic deep vein thrombosis (DVT) of lower extremity (CMS-HCC)    Cystic fibrosis with pulmonary exacerbation (CMS-HCC)    Nonproductive cough  Resolved Problems:    * No resolved hospital problems. *      Outpatient Provider Follow Up Issues:   - FU Endocrine regarding blood sugar control  - FU Pulmonology regarding CF exacerbation  - FU PCP regarding chronic conditions after hospitalization    Hospital Course:   Lenus Trauger is a 32 y.o. male with PMHx of CF s/p right upper lobectomy in 2008, CF related pancreatic deficiency and DM as well as anxiety and depression who presents to [facility] with DOE (dyspnea on exertion) along with increased shortness of breath on exertion, and chest pain which has been getting over the course of 1-2 months.  ??  Increased DOE w/ non productive cough/CF Exacerbation:  Patient was admitted wih increased DOE, and chest pain which has been getting over the course of 1-2 months. He lost insurance coverage and was trying to avoid additional medical care, but felt he could not wait any longer. His symptoms were likely due to a CF exacerbation +/- infection based on timing and nature of symptoms. We considered a PE since he reported a recent long car trip. However this is less likely as he is on long term anti-coagulation and reports good compliance. He was treated with IV Zosyn and tobramycin as well as mechanical clearance and his nebs/inhalers/meds (symbicort, Buspar, pulmozyme, hypertonic saline, singulair 10 mg qHS, Symdeko). Repeat AFB was negative and CF sputum cultures were negative- though last cultures from clinic grew pseudomonas only intermediately susceptible to ciprofloxacin, Zosyn and tobramycin. We continued other home meds including zyrtec, azithromycin and clofazimine. He improved rapidly and was discharged home 9/1 with home infusion to continue IV abx for 14 days total.  ??  H/O DVT: continue home Xarelto 10 mg daily  ??  DM: Patient has a h/o difficult to control sugars. He was placed on resistant scale SSI along with 60 U lantus nightly, 30 U Lispro TIDAC and 8 U with snacks. He was discharged home with 60U lantus qHS and 30U TIDAC with endocrine follow-up.  ??  GERD: continue home protonix 40 mg daily  ??  Pancreatic Deficiency: continue 7-8 capsules Creon w/ meals and 5-6 capsules with snacks  ??  Anxiety/Depression: continue home Vilazodone 40 mg daily; trazodone 100 mg qHS, Lyrica 300 BID, lamotrigine 100 mg daily, Buspar 30 mg daily, Baclofen 20 mg TID  ??  HTN: continue home lisinopril 10 mg daily, coreg 3.125 mg daily    Bilateral Elbow Pain/Chronic Myofacial Pain 2/2 Lobectomy: He has a  h/o myofacial pain usually controlled by home tramadol. On this admission, patient c/o L elbow pain initially on the evening of 8/27 which progressed to bilateral pain- 10/10 and sharp. No redness or swelling. No signs of infection. Denies trauma. Receiving home tramadol PRN for myofascial pain but did not think this was enough. He was started on 5 mg Oxy q6hrs PRN but was not sent home with any opioids.    Procedures:  None  No admission procedures for hospital encounter.  ______________________________________________________________________  Discharge Medications: Your Medication List      STOP taking these medications    insulin detemir U-100 100 unit/mL (3 mL) injection pen  Commonly known as:  LEVEMIR  Replaced by:  insulin glargine 100 unit/mL (3 mL) injection pen        START taking these medications    diclofenac sodium 1 % gel  Commonly known as:  VOLTAREN  Apply 2 g topically Four (4) times a day.     insulin glargine 100 unit/mL (3 mL) injection pen  Commonly known as:  BASAGLAR, LANTUS  Inject 0.6 mL (60 Units total) under the skin daily.  Replaces:  insulin detemir U-100 100 unit/mL (3 mL) injection pen     piperacillin-tazobactam 4.5 gram/100 mL Pgbk  Commonly known as:  ZOSYN  Infuse 100 mL (4.5 g total) into a venous catheter Every six (6) hours. for 9 days     tobramycin 250 mg, OVERFILL 10 mL in sodium chloride 0.9 % 100 mL IVPB  Infuse 250 mg into a venous catheter every twelve (12) hours.        CHANGE how you take these medications    insulin ASPART 100 unit/mL (3 mL) injection pen  Commonly known as:  NovoLOG FLEXPEN  Inject  30 units prior to each meal three times per day or as per MD instructions  What changed:  additional instructions     traMADol 50 mg tablet  Commonly known as:  ULTRAM  Take 2 tablets (100 mg total) by mouth every eight (8) hours as needed for pain. for up to 5 days  What changed:  when to take this        CONTINUE taking these medications    albuterol 90 mcg/actuation inhaler  Commonly known as:  PROVENTIL HFA;VENTOLIN HFA  Inhale 2 puffs every six (6) hours as needed.     albuterol 2.5 mg/0.5 mL nebulizer solution  Inhale 0.5 mL (2.5 mg total) by nebulization every six (6) hours as needed for wheezing.     ARIKAYCE 590 mg/8.4 mL Nbsp  Generic drug:  amikacin liposomal-neb.accessr  INHALE 590 MG DAILY     azithromycin 250 MG tablet  Commonly known as:  ZITHROMAX  Take 500 mg by mouth daily.     baclofen 20 MG tablet  Commonly known as:  LIORESAL  Take 20 mg by mouth Three (3) times a day.     blood sugar diagnostic Strp  Dispense 150 blood glucose test strips, ok to sub any brand preferred by insurance/patient, use up to 5x/day, dx E 08.9     blood-glucose meter Misc  Dispense meter that is preferred by patient's insurance company     budesonide-formoterol 160-4.5 mcg/actuation inhaler  Commonly known as:  SYMBICORT  Inhale 2 puffs Two (2) times a day.     busPIRone 30 MG tablet  Commonly known as:  BUSPAR  Take 30 mg by mouth Two (2) times a day.     carvedilol 3.125  MG tablet  Commonly known as:  COREG  Take 1 tablet (3.125 mg total) by mouth Two (2) times a day.     cetirizine 10 MG tablet  Commonly known as:  ZyrTEC  TAKE 1 TABLET (10 MG TOTAL) BY MOUTH DAILY.     cholecalciferol 1,000 unit (25 mcg) tablet  Generic drug:  cholecalciferol (vitamin D3)  TAKE 1 TABLET BY MOUTH ONCE DAILY     CLOFAZIMINE ORAL  Take 100 mg by mouth daily with lunch.     CREON 36,000-114,000- 180,000 unit Cpdr  Generic drug:  lipase-protease-amylase  TAKE 7-8 CAPSULES BY MOUTH WITH MEALS AND 5-6 CAPSULES WITH SNACKS     lamoTRIgine 100 MG tablet  Commonly known as:  LaMICtal  Take 100 mg by mouth daily.     lancets Misc  Dispense 150 lancets, ok to sub any brand preferred by insurance/patient, use up to 5x/day, dx E08.9     lisinopril 10 MG tablet  Commonly known as:  PRINIVIL,ZESTRIL  TAKE 1 TABLET BY MOUTH EVERY DAY     LYRICA 300 MG capsule  Generic drug:  pregabalin  TAKE 2 CAPSULES (300MG ) BY MOUTH TWICE DAILY     melatonin 10 mg Tab  Take 10 mg by mouth nightly.     montelukast 10 mg tablet  Commonly known as:  SINGULAIR  Take 1 tablet (10 mg total) by mouth nightly.     MVW Complete (pediatric multivit 61-D3-vit K) 1,500-800 unit-mcg Cap  Take 1 capsule by mouth Two (2) times a day.     MVW COMPLETE FORMUL PROBIOTIC 40 billion cell -15 mg Cpdr  Generic drug:  Lacto-Bif-Sac-Bacil-Strep-bact  Take 1 capsule by mouth daily.     omeprazole 20 MG capsule  Commonly known as:  PriLOSEC  Take 20 mg by mouth daily.     pen needle, diabetic 32 gauge x 5/32 Ndle  ok to sub any brand or size needle preferred by insurance/patient, use up to 4x/day, dx E 08.9     pramipexole 0.125 MG tablet  Commonly known as:  MIRAPEX  Take 0.25 mg by mouth daily.     PULMOZYME 1 mg/mL nebulizer solution  Generic drug:  dornase alfa  INHALE 1 VIAL VIA NEBULIZER TWO (2) TIMES A DAY.     rivaroxaban 20 mg tablet  Commonly known as:  XARELTO  Take 1 tablet (20 mg total) by mouth daily with evening meal.     sodium bicarb-sodium chloride 700-2,300 mg Pack  Irrigate each nostril with 120 mL of solution (1 packet mixed with 240 mL of distilled water) BID     sodium chloride 7% 7 % Nebu  Inhale 4 mL by nebulization Two (2) times a day.     SYMDEKO 100-150 mg (d)/ 150 mg (n) tablet  Generic drug:  tezacaftor-ivacaftor  TAKE BY MOUTH AS DIRECTED ON PACKAGE LABELING     traZODone 100 MG tablet  Commonly known as:  DESYREL  TAKE 1 TABLET BY MOUTH NIGHTLY     vilazodone 40 mg Tab  Take 1 tablet (40 mg total) by mouth daily.            Allergies:  Cayston [aztreonam lysine]; Cefepime; Other; Slo-bid 100; Banana; and Tobramycin  ______________________________________________________________________  Pending Test Results (if blank, then none):   Order Current Status    AFB culture In process          Most Recent Labs:  All lab results last 24 hours -   Recent  Results (from the past 24 hour(s))   POCT Glucose    Collection Time: 04/12/18  4:19 PM   Result Value Ref Range    Glucose, POC 306 (H) 65 - 179 mg/dL   Tobramycin Level, Random    Collection Time: 04/12/18  8:12 PM   Result Value Ref Range    Tobramycin Rm <0.6 Undefined ug/mL   POCT Glucose    Collection Time: 04/12/18  8:30 PM   Result Value Ref Range    Glucose, POC 312 (H) 65 - 179 mg/dL   POCT Glucose    Collection Time: 04/12/18 10:15 PM   Result Value Ref Range    Glucose, POC 138 65 - 179 mg/dL   Tobramycin Level, Random    Collection Time: 04/12/18 10:54 PM   Result Value Ref Range    Tobramycin Rm 8.1 Undefined ug/mL   Comprehensive Metabolic Panel    Collection Time: 04/13/18  4:17 AM   Result Value Ref Range    Sodium 138 135 - 145 mmol/L    Potassium 4.3 3.5 - 5.0 mmol/L    Chloride 104 98 - 107 mmol/L    CO2 30.0 22.0 - 30.0 mmol/L    BUN 15 7 - 21 mg/dL    Creatinine 1.61 0.96 - 1.30 mg/dL    BUN/Creatinine Ratio 18     EGFR CKD-EPI Non-African American, Male >90 >=60 mL/min/1.54m2    EGFR CKD-EPI African American, Male >90 >=60 mL/min/1.27m2    Glucose 126 65 - 179 mg/dL    Calcium 9.1 8.5 - 04.5 mg/dL    Albumin 3.5 3.5 - 5.0 g/dL    Total Protein 6.1 (L) 6.5 - 8.3 g/dL    Total Bilirubin 0.3 0.0 - 1.2 mg/dL    AST 52 19 - 55 U/L    ALT 69 19 - 72 U/L    Alkaline Phosphatase 54 38 - 126 U/L    Anion Gap 4 (L) 9 - 15 mmol/L   CBC    Collection Time: 04/13/18  4:17 AM   Result Value Ref Range    WBC 6.2 4.5 - 11.0 10*9/L    RBC 4.55 4.50 - 5.90 10*12/L    HGB 11.6 (L) 13.5 - 17.5 g/dL    HCT 40.9 (L) 81.1 - 53.0 %    MCV 80.2 80.0 - 100.0 fL    MCH 25.4 (L) 26.0 - 34.0 pg    MCHC 31.7 31.0 - 37.0 g/dL    RDW 91.4 (H) 78.2 - 15.0 %    MPV 8.8 7.0 - 10.0 fL    Platelet 297 150 - 440 10*9/L   POCT Glucose    Collection Time: 04/13/18  8:02 AM   Result Value Ref Range    Glucose, POC 142 65 - 179 mg/dL   POCT Glucose    Collection Time: 04/13/18 11:52 AM   Result Value Ref Range    Glucose, POC 150 65 - 179 mg/dL       Relevant Studies/Radiology (if blank, then none):  Xr Chest 2 Views    Result Date: 04/05/2018  EXAM: XR CHEST 2 VIEWS DATE: 04/05/2018 12:36 PM ACCESSION: [id number] UN DICTATED: 04/05/2018 12:36 PM INTERPRETATION LOCATION: [facility] CLINICAL INDICATION: 32 years old Male with COUGH  COMPARISON: CXR 12/29/2017. CT chest 12/29/2017. TECHNIQUE: PA and Lateral Chest Radiographs. FINDINGS: Unchanged positioning of accessed right chest wall Port-A-Cath with tip terminating over the lower right atrium. Sequelae of prior right upper lobectomy with unchanged mild persistent right  hemidiaphragm elevation. No focal consolidation or pulmonary edema. No pneumothorax or sizable pleural effusion. Cardiomediastinal silhouette is within normal limits. No acute osseous abnormality.     No acute airspace disease.    ______________________________________________________________________  Discharge Instructions:   Activity Instructions     Activity as tolerated                Other Instructions     Discharge instructions to patient: Call your primary care doctor and make an appointment to see them:      Within 2 weeks from the time you are discharged from the hospital               Follow Up instructions and Outpatient Referrals     Call MD for:  difficulty breathing, headache or visual disturbances      Call MD for:  persistent nausea or vomiting      Call MD for:  severe uncontrolled pain      Call MD for: Temperature > 38.5 Celsius ( > 101.3 Fahrenheit)      Discharge instructions      You have been admitted to [facility] for evaluation and treatment of Cystic Fibrosis Exacerbation. While in the hospital you were treated with IV antibiotics. You will continue receiving these antibiotics through your port after you leave the hospital. A home infusion center will be set up to help you administer these medications at home. If you experience worsening of your symptoms please contact your primary care provider or go to your nearest Emergency Department.    When you leave the hospital:    Please, take your medications as prescribed.     Please, make all follow up appointments and be sure to take all your medications with you so your provider can better guide your care.     If after leaving the hospital you notice new or worsening symptoms such as changes in your mental status, worsening abdominal pain, nausea and vomiting that prevents you from remaining hydrated or taking your medication, chest pain, shortness of breath, fevers or chills weakness/paralysis, difficulty walking, or seizure-like activity, please seek medical attention.       Discharge Instructions for Cystic Fibrosis    Cystic fibrosis (CF) is a lifelong condition that affects your lungs, digestive system, and other organs. When you have CF, your mucus, tears, sweat, and saliva are too thick and sticky. These thick fluids clog your lungs and digestive system. CF typically causes trouble breathing and problems breaking down and absorbing food.      Medicines:      Antibiotics: This medicine is given to help treat or prevent an infection caused by bacteria.        Bronchodilators: You may need bronchodilators to help open the air passages in your lungs, and help you breathe more easily.        Mucus thinning medicines: This is a medicine you breathe in to help thin the mucus in your respiratory system.        Anti-inflammatory medicines: You may need to take ibuprofen or steroids for a short time to decrease inflammation in your lungs and help you breathe more easily.        Pancreatic enzymes: These medicines help your digestive system break down food and absorb nutrients properly.        Take your medicine as directed. Call your primary healthcare provider if you think your medicine is not helping or if you have side effects. Tell him  if you are allergic to any medicine. Keep a list of the medicines, vitamins, and herbs you take. Include the amounts, and when and why you take them. Bring the list or the pill bottles to follow-up visits. Carry your medicine list with you in case of an emergency.      Your primary healthcare provider [facility]) will need to check your symptoms regularly. You may need changes to your treatment plan. Write down your questions so you remember to ask them during your visits.    Oxygen:  You may need extra oxygen if you have breathing problems. You breathe the oxygen through a face mask or through short, thin tubes that rest just inside your nose.    Exercises:      Airway clearance techniques: Your PHP may teach you special exercises to help remove mucus and let you breathe easier. These exercises may be used along with machines or special devices to help decrease your symptoms and risk of infections.        Get plenty of exercise: Talk to your PHP about the best exercise plan for you. Physical activities can help loosen secretions in your airways and lungs, and help you breathe easier. Exercise can decrease your blood pressure and improve your health.      Nutrition:  Eat healthy foods from all the food groups every day. Include a variety of whole grains, fruits, vegetables, dairy, beans, and lean meats. You may need extra calories, vitamins, or calcium added to your diet. You may need to take pancreatic enzymes to help you better absorb your food. Ask your PHP if you need to be on a special diet.    Tips to help you breathe easier:      Rest or sleep with your head elevated: You may have trouble breathing when lying down. Use pillows or foam wedges to elevate your head. This may help you breathe easier.        Use a humidifier: Use a cool mist humidifier or a vaporizer to increase air moisture in your home. This may make it easier for you to breathe and cough up mucus.        Do not smoke: If you smoke, it is never too late to quit. Smoke can make your coughing or breathing worse. If you smoke, ask for information about how to stop.      Avoid the spread of germs:        Wash your hands often: Use soap and water. Carry germ-killing gel with you when there is no soap and water available. Do not touch your eyes, nose, or mouth unless you have washed your hands first.        Cover your mouth when you cough: Cough into a tissue or your shirtsleeve so you do not spread germs from your hands.        Avoid others who are sick.      For more information:      [facility] [facility]      [address]      [place] , [place] [address]      Phone: [phone]      Web Address: http://small.net/      Contact your primary healthcare provider or specialist if:      You have a fever. You have a rash, itchy skin, or other new signs and symptoms.      You urinate less, have a dry mouth or cracked  lips, or feel dizzy.      You have chills or feel weak or achy.      You have trouble sleeping.      You have questions or concerns about your condition or care.    Seek care immediately or call 911 if:      You cough up blood.      You have pain in your chest or trouble breathing.      Your lips or fingernails turn blue or white.               Appointments which have been scheduled for you    May 28, 2018  9:20 AM EDT  (Arrive by 9:05 AM)  RETURN  DIABETES with Denzil Magnuson, MD  [facility] DIABETES AND ENDOCRINOLOGY [place] [facility] REGION) [address]  [place] [address]  [phone]      Jun 26, 2018  2:00 PM EST  (Arrive by 1:45 PM)  RETURN PFT 15 with [place] PFT 4  [facility] PULMONARY SPECIALTY FUNCT [place] [facility] REG[facility]) [address]  [address]  [place] [address]  [phone]      Jun 26, 2018  2:30 PM EST  (Arrive by 2:15 PM)  RETURN CYSTIC FIBROSIS with Darnelle Bos, MD  [facility] PULMONARY SPECIALTY CL [place] [facility]) [address]  [address]  [place] [address]  [phone]           ______________________________________________________________________  Discharge Day Services:  BP 133/78  - Pulse 90  - Temp 36.2 ??C (Oral)  - Resp 18  - Ht 182.9 cm (6')  - Wt (!) 120 kg (264 lb 8.8 oz)  - SpO2 99%  - BMI 35.88 kg/m??   Pt seen on the day of discharge and determined appropriate for discharge.    Condition at Discharge: stable    Length of Discharge: I spent greater than 30 mins in the discharge of this patient.    General Appearance: Well appearing. In no acute distress. Overweight.  HEENT: Head is atraumatic and normocephalic. Sclera anicteric without injection. Oropharyngeal membranes are moist with no erythema or exudate.  Neck: Supple.  Lungs: Normal work of breathing. No wheezes or crackles. clear to auscultation bilaterally  Heart: Regular rate and rhythm. No murmurs, rubs, or gallops.  Abdomen: Soft, nontender, nondistended.  Extremities: No clubbing, cyanosis, or edema.    [name], MD  PGY-1  Department of Neurology

## 2018-04-15 NOTE — Unmapped (Signed)
Faxed a prescription to [facility] for a Pari Vios Pro nebulizer compressor and a Pari LC plus cup. Patient informed via My Chart.

## 2018-04-15 NOTE — Unmapped (Signed)
Spoke with Enid Derry, pharmacist at [facility]. Confirmed end of therapy date (D14) as 04/20/18. Requesting labs either today or Thursday. Referral to home health placed with lab orders.      Patient also messaged provider through MyChart request for wife to learn port access. Order placed.     Both referral and order for port access faxed to the attention of Ethan at [place] at [phone].    [name], RN  CF Nurse Coordinator   [phone]

## 2018-04-16 MED ORDER — MONTELUKAST 10 MG TABLET
ORAL_TABLET | Freq: Every evening | ORAL | 3 refills | 0.00000 days | Status: CP
Start: 2018-04-16 — End: 2019-04-16
  Filled 2018-04-21: qty 30, 30d supply, fill #0

## 2018-04-16 NOTE — Unmapped (Signed)
[name] called the CF nurse coordinator office. States he would like some medications prescribed to [facility] pharmacy . Says he thought they were prescribed to the outpatient pharmacy at discharge, but they were not.     He is requesting:    Buspar (states he takes it twice daily, though charted both as daily and twice daily)  Baclofen three times a day  Montelukast (sent in per pulmonary protocol)     He also notes he has been taking his tobramycin 250mg  once daily, as that was what was told to him at discharge by his nurse (though it is correctly documented as twice daily on his discharge paperwork).     [name] states his nurse is unsure if she can come for a trough tomorrow. Garnell has been dosing 0900-0930. Have asked that he start with twice daily dosing.  Asked [name] to talk with his home health nurse to collect either a trough between 0800-0900 or a peak at 1030 with his usual home IV antibiotic monitoring labs.      Note routed to Dr. [name] and Alton Revere.     [name], RN  CF Nurse Coordinator   [phone]

## 2018-04-17 MED ORDER — DORNASE ALFA 1 MG/ML SOLUTION FOR INHALATION
Freq: Two times a day (BID) | RESPIRATORY_TRACT | 3 refills | 0 days | Status: SS
Start: 2018-04-17 — End: 2019-02-01

## 2018-04-17 MED ORDER — NEBULIZERS
3 refills | 0 days
Start: 2018-04-17 — End: ?

## 2018-04-17 MED ORDER — SODIUM CHLORIDE 0. % IV BOLUS
Freq: Every day | INTRAVENOUS | 0 refills | 0.00000 days | Status: CP
Start: 2018-04-17 — End: 2018-05-05
  Filled 2018-04-21: qty 720, 90d supply, fill #0

## 2018-04-17 MED ORDER — SODIUM CHLORIDE 7 % FOR NEBULIZATION
Freq: Two times a day (BID) | RESPIRATORY_TRACT | 3 refills | 0.00000 days | Status: SS
Start: 2018-04-17 — End: 2018-08-18

## 2018-04-17 NOTE — Unmapped (Signed)
[facility] Refill and Clinical Coordination Note  Medication(s): Symdeko 100/150 (OH: 7 days), Arikayce 590 (OH: 7 days), Creon 36000 (OH: 7 days), cetirizine 10mg , HTS 7% ( ), singulair 10mg , Lyrica 300mg , Neb cup x4  Ref req for Buspar and Baclofen sent to prescriber by patient. If fills come in, please send with this delivery.  Do not hold up this delivery for these RX if they do not come in time.      Bubba Camp, DOB: 06-20-1986  Phone: [phone] (home) , Alternate phone contact: N/A  Shipping address: [address]  [place] [address]  Phone or address changes today?: No  All above HIPAA information verified.  Insurance changes? No    Completed refill and clinical call assessment today to schedule patient's medication shipment from the [facility] [phone]).      MEDICATION RECONCILIATION    Confirmed the medication and dosage are correct and have not changed: Yes, regimen is correct and unchanged.    Were there any changes to your medication(s) in the past month:  No, there are no changes reported at this time.    ADHERENCE    Is this medicine transplant or covered by Medicare Part B? No.        Did you miss any doses in the past 4 weeks? No missed doses reported.  Adherence counseling provided? Not needed     SIDE EFFECT MANAGEMENT    Are you tolerating your medication?:  Jenaro reports tolerating the medication.  Side effect management discussed: None      Therapy is appropriate and should be continued.    Evidence of clinical benefit: See Epic note from 03/17/18      FINANCIAL/SHIPPING    Delivery Scheduled: Yes, Expected medication delivery date: 04/22/18 UPS     Additional medications refilled: No additional medications/refills needed at this time.    The patient will receive a drug information handout for each medication shipped and additional FDA Medication Guides as required.      Nelson did not have any additional questions at this time.    Delivery address confirmed in Epic.     We will follow up with patient monthly for standard refill processing and delivery.      Thank you,  Julianne Rice   [facility] Shared [facility] Specialty Pharmacist

## 2018-04-17 NOTE — Unmapped (Addendum)
nOutpatient Lab Results from Wednesday, April 16, 2018     Outpatient IV Antimicrobial Therapy:  Start Date: 04/06/18  End Date: 04/20/18  Line: PORT  Referring pulmonologist: Maia Plan    IV Antibiotics Ordered:  -Tobramycin 250mg  IV every 12 hours (Conventional dosing, infuse over 30 minutes)  -Piperacillin-tazobactam (Zosyn) 4.5g IV every 6 hours       GENERAL LABS:     [facility]   Home  9/4 Hospital  9/1   BUN  15 15   SCR  1.28 0.85   MG 1.7 1.5 (8/25)     CBC    Home  9/4 Hospital  9/1   WBC 6.0 6.2   HGB  13.5 11.6   HCT 40.8 36.5   PLT  344 297     Date andTime: Measured Drug Levels   9/4 @ 2050: Tobramycin Trough: 0.6  mcg/mL      Patient was called to discuss start and stop time of Tobramycin on date of drug level collection.    Patient Reported Administration Time:   Start of infusion: 0900   End of infusion: 0930     PLAN     Talked with Lonald about his recent labs. State he overall is feeling okay. Has some ringing in his ears (though normally has some ringing in his ears). He notes the ringing started when he got home and notices it typically after his infusion, though it comes and goes.     He is taking Tylenol for HAs and Tramadol for pain, avoiding NSAIDS. Drinking mainly water, noting his urine to be a little darker than usual. He has received IVF boluses before for when his creatinine bumps.     Encouraged [name] to increase his fluid intake while we determine the next steps.      **Spoke with Dr. [name]. Agreeable to prescribing NS bolus 1 L daily for 3 days and recheck labs on Sunday or Monday. This was communicated to Enid Derry, pharmacist with [facility] (order faxed to [phone]) and to [facility].     1. Results note forwarded to: Maia Plan  2. Drug levels forwarded to CPP Michel Santee, RN

## 2018-04-17 NOTE — Unmapped (Signed)
Adult Cystic Fibrosis Clinic Pharmacist Note: Medication Management/Renewals     Sent medication orders for Christian Young.    1. Cystic fibrosis (CMS-HCC)  - sodium chloride 7% 7 % Nebu; Inhale 4 mL by nebulization Two (2) times a day.  Dispense: 720 mL; Refill: 3  Sent to: [facility]    Sent [facility] due to insurance restrictions:  - dornase alfa (PULMOZYME) 1 mg/mL nebulizer solution; Inhale 2.5 mg Two (2) times a day.  Dispense: 450 mL; Refill: 3        [name], PharmD, BCPS, CPP  Clinical Pharmacist Practitioner  [facility] Adult Cystic Fibrosis Clinic  [facility]  Pager: [phone]

## 2018-04-18 MED ORDER — BACLOFEN 20 MG TABLET
ORAL_TABLET | Freq: Three times a day (TID) | ORAL | 6 refills | 0 days | Status: CP
Start: 2018-04-18 — End: 2018-06-19
  Filled 2018-04-21: qty 90, 30d supply, fill #0

## 2018-04-18 MED ORDER — BUSPIRONE 30 MG TABLET
ORAL_TABLET | Freq: Two times a day (BID) | ORAL | 6 refills | 0 days | Status: CP
Start: 2018-04-18 — End: 2018-10-01
  Filled 2018-04-21: qty 60, 30d supply, fill #0

## 2018-04-18 NOTE — Unmapped (Signed)
Addended by: Cicero Duck on: 04/18/2018 11:11 AM     Modules accepted: Orders

## 2018-04-18 NOTE — Unmapped (Signed)
Patient messaged CF nurse coordinator if Rx for baclofen and buspar had been written. Medications pended and routed to provider to fill or decline. Patient requesting them to be filled at [facility].

## 2018-04-18 NOTE — Unmapped (Signed)
Addended by: Darnelle Bos on: 04/18/2018 01:47 PM     Modules accepted: Orders

## 2018-04-21 MED ORDER — PREGABALIN 300 MG CAPSULE
ORAL_CAPSULE | Freq: Two times a day (BID) | ORAL | 0 refills | 0 days
Start: 2018-04-21 — End: 2018-06-19

## 2018-04-21 MED FILL — SYMDEKO 100 MG-150 MG (DAY)/150 MG (NIGHT) TABLETS: 28 days supply | Qty: 56 | Fill #0 | Status: AC

## 2018-04-21 MED FILL — CREON 36,000 UNIT-114,000 UNIT-180,000 UNIT CAPSULE,DELAYED RELEASE: 30 days supply | Qty: 1260 | Fill #1 | Status: AC

## 2018-04-21 MED FILL — LC PLUS MISC: 90 days supply | Qty: 4 | Fill #0

## 2018-04-21 MED FILL — BACLOFEN 20 MG TABLET: 30 days supply | Qty: 90 | Fill #0 | Status: AC

## 2018-04-21 MED FILL — CREON 36,000 UNIT-114,000 UNIT-180,000 UNIT CAPSULE,DELAYED RELEASE: 30 days supply | Fill #1

## 2018-04-21 MED FILL — BUSPIRONE 30 MG TABLET: 30 days supply | Qty: 60 | Fill #0 | Status: AC

## 2018-04-21 MED FILL — SYMDEKO 100 MG-150 MG (DAY)/150 MG (NIGHT) TABLETS: 28 days supply | Qty: 56 | Fill #0

## 2018-04-21 MED FILL — SODIUM CHLORIDE 7 % FOR NEBULIZATION: 90 days supply | Qty: 720 | Fill #0 | Status: AC

## 2018-04-21 MED FILL — ARIKAYCE 590 MG/8.4 ML SUSPENSION FOR INHALATION VIA NEBULIZATION: 28 days supply | Qty: 235 | Fill #0 | Status: AC

## 2018-04-21 MED FILL — CETIRIZINE 10 MG TABLET: 90 days supply | Qty: 90 | Fill #0 | Status: AC

## 2018-04-21 MED FILL — LC PLUS MISC: 90 days supply | Qty: 4 | Fill #0 | Status: AC

## 2018-04-21 MED FILL — ARIKAYCE 590 MG/8.4 ML SUSPENSION FOR INHALATION VIA NEBULIZATION: 28 days supply | Qty: 235.2 | Fill #0

## 2018-04-21 MED FILL — MONTELUKAST 10 MG TABLET: 30 days supply | Qty: 30 | Fill #0 | Status: AC

## 2018-04-21 NOTE — Unmapped (Signed)
Patient messaged nurse coordinator, asking for an extension of IV antibiotic therapy. He is on day 15 with one dose of zosyn and 3 doses of tobramycin left. Notes he still feels crummy.  Christian Young states I really don't know why I am still feeling bad. I mean I have been kinda doing a lot since being home maybe that's it.     Spoke with Christian Young. Reports his shortness of breath and DOE hasn't gone away completely. Chest pain has improved. Notes he is doing 6 IV antibiotic treatments a day, setting an alarm for his infusion times. 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 Christian Young feels like he is not at 100% and would like to see if additional antibiotics will help.       Christian Young reports being compliant with airway clearance and breathing treatments. Just feels like it is non-stop therapies.  He is willing to come to clinic on Thursday, believes his wife is able to come too.    Note routed to Dr. [name] and Leonor Liv, PA-C, who is willing to see patient on Thursday in clinic.     Christian Flake [name], RN  CF Nurse Coordinator   [phone]

## 2018-04-23 NOTE — Unmapped (Signed)
Outpatient Lab Results from Wednesday, April 20, 2018     Outpatient IV Antimicrobial Therapy:  Start Date: 04/06/18  End Date: 04/20/18  Line: PORT  Referring pulmonologist: Maia Plan    IV Antibiotics Ordered:  -Tobramycin 250mg  IV every 12 hours (Conventional dosing, infuse over 30 minutes)  -Piperacillin-tazobactam (Zosyn) 4.5g IV every 6 hours       GENERAL LABS: Please see media tab [facility] Lab for 04/20/18    CHEM     9/8 Home  9/4 Hospital  9/1   BUN  13 15 15    SCR  0.90 1.28 0.85   MG 1.6 1.7 1.5 (8/25)     CBC      9/8 Home  9/4 Hospital  9/1   WBC 4.7 6.0 6.2   HGB  12.9 13.5 11.6   HCT 38.3 40.8 36.5   PLT  270 344 297     Date andTime: Measured Drug Levels   9/8 @ 2000: Tobramycin Trough: <0.6  mcg/mL      Patient was called to discuss start and stop time of Tobramycin on date of drug level collection.  Please Note:  Pt did not dose 04/20/18 at 0900 as planned, no morning dose on 04/20/18.    Patient Reported Administration Time: 04/19/18   Start of infusion: 2100   End of infusion: 2130     PLAN     Spoke with pt who let me know that episodes of tinnitus have lessened but headaches have worsened.  He describes having ringing in his ears now only occasionally and a headache almost all the time.  He has been taking 1000 mg of tylenol q3-4h while awake for a total of ~ 4 doses a day, which takes the edge off of his headache.  His finished his 3rd IV bolus today and his urine is lighter in color.  He skipped his morning IV abx doses on 04/20/18 due to miscommunication with his home health nurse.  Evidently they thought she was coming at 0800 for needle change so his wife de accessed him so that he could have a shower but she did not come until 2000.  Encouraged him to continue to force fluids.    1. Results note forwarded to: Maia Plan  2. Drug levels forwarded to CPP Renae Boerneke    Dorna Bloom, RN

## 2018-04-23 NOTE — Unmapped (Signed)
Pt will continue IV medications until appt on 04/24/18.

## 2018-04-23 NOTE — Unmapped (Signed)
Pulmonary Outpatient Pharmacist Consult Note: Therapeutic Drug Level Monitoring    Christian Young is a 32 y.o. male on outpatient IV antibiotics for CF exacerbation.      Please see previous Documentation note by Rosanne Sack, CF Nurse Coordinator on 04/22/18 for detailed lab results.    Outpatient IV Antimicrobial Therapy:  Start Date: 04/06/18  End Date: 04/20/18 (D14) --> 04/24/18 (until pulmonary follow up)    IV Antibiotics Ordered:  -Tobramycin 250mg  IV every 12 hours (Conventional dosing, infuse over 30 minutes)  -Piperacillin-tazobactam (Zosyn) 4.5g IV every 6 hours     Patient Parameters   Patient weight:   Wt Readings from Last 1 Encounters:   04/09/18 (!) 120 kg (264 lb 8.8 oz)     Drug Administration Time:   04/20/18: missed am dose due to home health miscommunication   04/19/18: 2100-2130     Goal Drug Levels:   -Tobramycin (Conventional Dosing): Target end of infusion peak: 10-14 mcg/mL, trough < 1 mcg/mL     Date andTime: Measured Drug Levels   04/20/18 2000: Tobramycin Trough: < 0.6 mcg/mL  Drawn 22.5 hour(s) after end of infusion time. Patient did not infuse his am dose         Patient-Specific Pharmacokinetic Parameters:    **Unable to perform kinetics kinetics as level is undectable*         Recommendations at this time unless clinically indicated otherwise:    -Current levels unknown as level was undectable. However, noted improvement in Scr from 1.28 to 0.9 after NS 1 L x 3 over the weekend.   -Therapy originally planned to end 04/20/18; however patient will continue until  04/24/18 (f/u in pulmonary clinic), please reach out if there is any plan to extend     Labs:  -No further follow up labs were ordered as patient due to complete day 14 of IV antibiotics on  04/20/18, will order labs if therapy extended based on follow up on 9/12               Consult note routed back to:   Verlin Fester, PA-C; Penelope Coop, CF Nurse Coordinator

## 2018-04-25 MED ORDER — PEN NEEDLE, DIABETIC 32 GAUGE X 5/32" (4 MM)
12 refills | 0 days | Status: CP
Start: 2018-04-25 — End: 2019-04-25

## 2018-04-25 MED ORDER — BLOOD SUGAR DIAGNOSTIC STRIPS
ORAL_STRIP | 12 refills | 0 days | Status: CP
Start: 2018-04-25 — End: 2019-04-25

## 2018-04-25 MED ORDER — INSULIN ASPART (U-100) 100 UNIT/ML (3 ML) SUBCUTANEOUS PEN
12 refills | 0 days | Status: CP
Start: 2018-04-25 — End: 2018-09-19

## 2018-04-25 MED ORDER — INSULIN GLARGINE (U-100) 100 UNIT/ML (3 ML) SUBCUTANEOUS PEN
Freq: Every day | SUBCUTANEOUS | 12 refills | 0.00000 days | Status: SS
Start: 2018-04-25 — End: 2018-08-18

## 2018-04-25 NOTE — Unmapped (Signed)
Spoke with Christian Young. No labs collected on Thursday. He was unable to come to clinic on Thursday, as he did not have a ride to clinic. Overall, Christian Young feels much improved. Notes more energy, less groggy. Also reports breathing better. He will continue with the antibiotics on hand, and his wife will de-access his port-a-cath at the end of therapy.    Spoke with pharmacist, Christian Young, at [facility]. Confirmed he will end therapy as schedule with the supply of antibiotics he has on hand.    Christian Flake [name], RN  CF Nurse Coordinator   [phone]

## 2018-04-25 NOTE — Unmapped (Signed)
Christian Young spoke with Christian Young, pharmacist with [facility]. Christian Young is requesting transfer of prescriptions to [facility], as the 90 day co-pay for generics is $0.     Christian Young is requesting the medications to go to the mail order pharmacy:      Baclofen   Buspar   Cetirizine   Montelukast   HTS 7%   Lantus/Basaglar   Novolog   Pen needles   Diclofenac   Lyrica   Trazodone     Prescriptions sent for 90 day with 3 refills:   Cetirizine  Montelukast  Hypertonic Saline 7%    Prescriptions sent for 90 days with 0 refills (requesting PCP or psychiatry to prescribe afterwards):  Baclofen  Buspar  Diclofenac  Lyrica  Trazodone     Prescription request routed to Endocrine:  Lantus/Basaglar    Novolog  Pen Needles

## 2018-04-25 NOTE — Unmapped (Signed)
Spoke with Christian Young after he had spoke with Christian Young.  Currently Christian Young would rather have the prescriptions filled at [facility] in [place]. State they could match what [employer] charges.     Currently Christian Young has plenty of medications on hand. Preferred pharmacy added to patient's profile.     Christian Flake [name], RN  CF Nurse Coordinator   [phone]

## 2018-04-30 NOTE — Unmapped (Signed)
Insurance prefers Hospital doctor over Lantus.

## 2018-05-05 MED ORDER — OMEPRAZOLE 20 MG CAPSULE,DELAYED RELEASE: 20 mg | capsule | Freq: Every day | 3 refills | 0 days | Status: SS

## 2018-05-05 MED ORDER — OMEPRAZOLE 20 MG CAPSULE,DELAYED RELEASE
ORAL_CAPSULE | Freq: Every day | ORAL | 3 refills | 0 days | Status: SS
Start: 2018-05-05 — End: 2018-08-18

## 2018-05-05 MED ORDER — AZITHROMYCIN 250 MG TABLET
ORAL_TABLET | Freq: Every day | ORAL | 11 refills | 0 days | Status: CP
Start: 2018-05-05 — End: 2018-06-25

## 2018-05-05 MED ORDER — AMIKACIN LIPOSOMAL 590 MG/8.4 ML SUSP FOR INHALATION, NEBULIZER ACCES.
11 refills | 0 days | Status: CP
Start: 2018-05-05 — End: 2018-06-25
  Filled 2018-05-15: qty 235.2, 28d supply, fill #0

## 2018-05-05 MED ORDER — TEZACAFTOR 100 MG-IVACAFTOR 150 MG(DAY)/IVACAFTOR 150 MG(NITE) TABLETS
3 refills | 0 days | Status: CP
Start: 2018-05-05 — End: 2018-10-08
  Filled 2018-05-15: qty 56, 28d supply, fill #0

## 2018-05-05 MED ORDER — BUDESONIDE-FORMOTEROL HFA 160 MCG-4.5 MCG/ACTUATION AEROSOL INHALER
Freq: Two times a day (BID) | RESPIRATORY_TRACT | 11 refills | 0.00000 days | Status: CP
Start: 2018-05-05 — End: 2019-05-05

## 2018-05-05 MED ORDER — MVW COMPLETE FORMULATION PROBIOTIC 40 BILLION CELL-15 MG CAPSULE,DR
ORAL_CAPSULE | Freq: Every day | ORAL | 3 refills | 0 days | Status: CP
Start: 2018-05-05 — End: 2019-05-05

## 2018-05-05 MED ORDER — MVW COMPLETE FORMULATION D5000  5,000 UNIT-800 MCG CAPSULE
ORAL_CAPSULE | Freq: Every day | ORAL | 3 refills | 0.00000 days | Status: CP
Start: 2018-05-05 — End: 2019-02-01

## 2018-05-05 NOTE — Unmapped (Signed)
Received a call from [name] in the [facility] nurse coordinator office.  States his new insurance will cover the cost of his diabetes supplies through [employer] [facility] provided, it is a certain brand. He is stating they are requesting a Dexcom G6 system.     Will route request to Endocrine. He was seen in Endocrine during his most recent hospital stay. He had to cancel his last endocrine appointment and is looking to schedule on the same day as his Pulmonary clinic visit (November 14).     Will route to clinic staff at Endocrine and work to schedule an appointment on the same day.     [name], RN  CF Nurse Coordinator   [phone]

## 2018-05-05 NOTE — Unmapped (Signed)
Adult Cystic Fibrosis Clinic Pharmacist Note: Medication Management     Left voicemail [name]'s wife Nadara Mode and also sent MyChart message to [facility] regarding where all his medications as f/u after [name] spoke with CF nurse last week. I called [facility] to confirm they had all the medications on file or have filled medications. Asked they not profile the specialty medications since they do not have access. Resent those to [facility]. Left [facility] with CF Nurse Line in case any questions/clarifications.    [facility] (either due to specialty pharmacy access meds/[facility] can't order CF vitamins or bill [facility])   -Symdeko   -Arikayce   -MVW Complete Formulation D5000    [facility]:   -Pulmozyme (your insurance requires it to go here)     [facility]:  -Creon   -Hypertonic Saline 7%   -Azithromycin   -Baclofen   -Symbicort Inhaler   -Buspirone   -Carvedilol   -Cetirizine   -NovoLOG insulin   -Basaglar insulin   -Lamotrigine   -Lisinopril   -Montelukast   -Omeprazole   -Pregabalin   -Xarelto   -Trazodone   -Vilazodone     I did ask if [name] wanted to keep the HTS 7% at [facility] if he needed PARI neb cups with his refills.   [name] is also due for [employer] (probably more so for his CF vitamins). His other option is to get CF vitamins through [facility].    [name], PharmD, BCPS, CPP  Clinical Pharmacist Practitioner  [facility] Adult Cystic Fibrosis Clinic  [facility]asis [facility]  Pager: [phone]      CC:   Cyndie Mull, RN

## 2018-05-06 MED ORDER — BLOOD-GLUCOSE METER,CONTINUOUS: each | 0 refills | 0 days | Status: AC

## 2018-05-06 MED ORDER — BLOOD-GLUCOSE TRANSMITTER DEVICE
11 refills | 0.00000 days | Status: CP
Start: 2018-05-06 — End: 2018-05-06

## 2018-05-06 MED ORDER — BLOOD-GLUCOSE SENSOR DEVICE
11 refills | 0.00000 days | Status: CP
Start: 2018-05-06 — End: 2018-05-06

## 2018-05-06 MED ORDER — BLOOD-GLUCOSE TRANSMITTER DEVICE: Device | 11 refills | 0 days | Status: AC

## 2018-05-06 MED ORDER — BLOOD-GLUCOSE SENSOR DEVICE: Device | 11 refills | 0 days | Status: AC

## 2018-05-06 MED ORDER — BLOOD-GLUCOSE METER,CONTINUOUS
0 refills | 0.00000 days | Status: CP
Start: 2018-05-06 — End: 2018-05-06

## 2018-05-06 NOTE — Unmapped (Signed)
Done, so now I have sent it to both pharmacies, hopefully one of them is correct  JDostou, MD

## 2018-05-12 NOTE — Unmapped (Signed)
[facility] Specialty Medication Referral: No PA required    Medication (Brand/Generic): Symdecko, Arikayce    Initial Benefits Investigation Claim completed with resulted information below:  No PA required  Patient ABLE to fill at [facility]  Insurance Company:  [employer]  Anticipated Copay: $0    As Co-pay is under $25 defined limit, per policy there will be no further investigation of need for financial assistance at this time unless patient requests. This referral has been communicated to the provider and handed off to the [facility] team for further processing and filling of prescribed medication.   ______________________________________________________________________  Please utilize this referral for viewing purposes as it will serve as the central location for all relevant documentation and updates.

## 2018-05-13 NOTE — Unmapped (Signed)
[facility] Pharm[facility] Refill Coordination Note  Specialty Medication(s): Arikayce 590mg  ((OH: 1 week) , Symdeko 100/150 (OH: 1 week)   Additional Medications shipped: none    Getting MVW and Culturelle through manufacturer    Christian Young, DOB: [birth year]  Phone: [phone] (home) , Alternate phone contact: N/A  Phone or address changes today?: No  All above HIPAA information was verified with patient.  Shipping Address: [address]  [place] [address]   Insurance changes? No    Completed refill call assessment today to schedule patient's medication shipment from the [facility] [phone]).      Confirmed the medication and dosage are correct and have not changed: Yes, regimen is correct and unchanged.    Confirmed patient started or stopped the following medications in the past month:  No, there are no changes reported at this time.    Are you tolerating your medication?:  [name] reports tolerating the medication.    ADHERENCE        Did you miss any doses in the past 4 weeks? No missed doses reported.    FINANCIAL/SHIPPING    Delivery Scheduled: Yes, Expected medication delivery date: 05/16/18 ups     CF [employer]well [employer] Active? Yes    The patient will receive a drug information handout for each medication shipped and additional FDA Medication Guides as required.      [name] asked about a medication he is getting through the FDA as part of a study.  He said he will call his clinic nurse to find out more information on how to get it refilled    Delivery address validated in Epic.    We will follow up with patient monthly for standard refill processing and delivery.      Thank you,  Julianne Rice   [facility] Shared [facility] Specialty Pharmacist

## 2018-05-15 MED FILL — ARIKAYCE 590 MG/8.4 ML SUSPENSION FOR INHALATION VIA NEBULIZATION: 28 days supply | Qty: 235 | Fill #0 | Status: AC

## 2018-05-15 MED FILL — SYMDEKO 100 MG-150 MG (DAY)/150 MG (NIGHT) TABLETS: 28 days supply | Qty: 56 | Fill #0 | Status: AC

## 2018-05-29 NOTE — Unmapped (Signed)
Patient messaged the [facility] nurse coordinator after hours through MyChart:    Christian Young,     I am just letting you know I am not feeling my best. I have an appointment tomorrow with my primary care for a second round of oral antibiotics. I was beginning to feel better after the first round but I went to a local fair and it rained on that day I left my jacket in n the car. So I think I am paying for that mistake by feeling a little worse from symptoms. My symptoms are a lot of sinus pressure, horrible ear pain so bad that it wakes me up, green stuff coming out of my nose. I hope if I do a longer oral antibiotic it will keep me from having to come up there. I just wanted to inform you of my progress     Before I could reach out to speak with the patient, he messaged again:    So I just got back home from my primary care and I was tested for the flu and for strep and the only thing that came back positive was the strep. They have put me on Levaquin for 2 weeks if after the 2 weeks I don't feel better I will be at clinic on the 7th anyways so we can address it then. Just wanted to tell what is going on. Didn't want you guys to think I was leaving y'all out of my care. Thank you so much Christian Young       Note routed to Dr. [name].    [name], RN  CF Nurse Coordinator   [phone]

## 2018-06-16 NOTE — Unmapped (Signed)
[facility] Specialty Pharmacy Refill Coordination Note    Specialty Medication(s) to be Shipped:   CF/Pulmonary: -SYMDEKO (tezacaftor 100mg /ivacaftor 150mg  and ivacaftor 150mg ) tablets  Declined refill of Arikayce  Other medication(s) to be shipped: none     Christian Young, DOB: Apr 08, 1986  Phone: [phone] (home)       All above HIPAA information was verified with patient.     Completed refill call assessment today to schedule patient's medication shipment from the [facility] [phone]).       Specialty medication(s) and dose(s) confirmed: Regimen is correct and unchanged.   Changes to medications: Christian Young reports no changes reported at this time.  Changes to insurance: No  Questions for the pharmacist: No    The patient will receive a drug information handout for each medication shipped and additional FDA Medication Guides as required.      DISEASE/MEDICATION-SPECIFIC INFORMATION        For CF patients: CF Healthwell [employer] Active? Yes    ADHERENCE     Medication Adherence            Support network for adherence:  family member                  MEDICARE PART B DOCUMENTATION         SHIPPING     Shipping address confirmed in Epic.     Delivery Scheduled: Yes, Expected medication delivery date: 06/18/18 via UPS or courier.     Medication will be delivered via UPS to the home address in Epic WAM.    Arnold Long   [facility] Specialty Pharmacist

## 2018-06-17 MED FILL — SYMDEKO 100 MG-150 MG (DAY)/150 MG (NIGHT) TABLETS: 28 days supply | Qty: 56 | Fill #1 | Status: AC

## 2018-06-17 MED FILL — SYMDEKO 100 MG-150 MG (DAY)/150 MG (NIGHT) TABLETS: 28 days supply | Qty: 56 | Fill #1

## 2018-06-18 NOTE — Unmapped (Signed)
Chief Complaint:     32 y.o. male with a PMH of CF, HTN w CFRD.   Chief Complaint   Patient presents with   ??? 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 CFRD dx'd ~2/19.   At last visit:   #CFRD in the setting of CF, elevated Hgb A1c > 6.5% (7.6% in this patient) and blood glucoses in the 200-300's mg/dL, we will start insulin therapy.  --Detemir 5 u qHS with instructions to increase by 2 units q3d until blood glucose consistently between 80-130's mg/dL if readings initially consistently > 130 mg/dL  --Aspart 4 u w/ dinner  --Sliding scale if significantly elevated blood glucose (201-250 +2 - 251-300 +4 - 301-350 +6 - 351-400 +8)  --F/u in 4 weeks    Since last visit:   Hospitalizations May and August d/t CF, seen by inpatient endo consult service  Contacted by Ms [name] RN via CF clinic re: pt request for Dexcom G6, I escripted  States not feeling his best today, thinks starting to have CF exac, to see pulm after our visit today  Using the [facility] and pleased w it, here today w his wife who is EMT  Has questions about pump therapy pros/cons/technique  Wife recalls he's had 1-2 lows while shopping to 30-40 range where he's relatively asymptomatic, none recently    Currently taking the following for CFRD:   Basaglar Lantus insulin 60 units qhs  Aspart insulin 30 units ac meals, 8 units ac snacks + 4:50>150    Denies symptoms consistent with peripheral or autonomic neuropathy  Denies recent lows requiring assistance  Denies recent nocturnal lows      Continuous glucose monitor (CGM) was computer downloaded, I personally reviewed greater than 72 hours of data and interpreted, and discussed the findings with the patient. Data summery pasted in CMA note above, notable for mostly sustained  and also postprandial hyperglycemia      Initial HPI carried forward.   Patient is a 32 year old male with a PMH of CF, HTN and GAD/MDD who presents for a recurrence of CFRD.     The patient was initially treated for cystic fibrosis related diabetes (CFRD) when he received his care at [facility] with 5 units of levemir at night. Subsequently, after moving to [place], he was taken off of his diabetes medications which was approximately 3 years ago. Since then he has continued to check his blood sugar approximately three times per day. Recently, his blood sugars have been typically running 200's - 300's. He was scheduled for endocrinology assessment for CFRD and prior to today's appointment, his hemoglobin A1c was found to be 7.6%. Upon arrival to the clinic today, his POCT glucose was 306 mg/dL.     He denies any regular hypglycemic episodes. If he does not eat much, he reports that he will occasionally dip lower than usual (approxiamtely once per week his blood glucose will run <100 mg/dL and approximately once per month his blood glucose will be <60 mg/dL). He states that recently, however, the lowest he has seen is 160 mg/dL. He endorses occasional blurriness in his vision and denies any neuropathic symptoms in his feet. He requires a new meter. His cystic fibrosis affects his lungs, sinuses and GI tract; he endorses pancreatic involvement. He does not appear malnourished. Most recently he was hospitalized in January 2019 for a CF-bronchiectasis exacerbation. He had a RUL lobectomy  in August 2018. For his HTN, he takes lisinopril and carvedilol and he takes lamotrigine for his GAD and MDD.    The past medical history, surgical history, family history, social history, medications and allergies were reviewed in Epic.    Past Medical History:   Diagnosis Date   ??? Anxiety    ??? Chronic pain disorder    ??? Cystic fibrosis (CMS-HCC)    ??? Depression    ??? Hypertension    ??? Nonproductive cough 04/05/2018     Past Surgical History:   Procedure Laterality Date   ??? PR REMOVAL OF LUNG,LOBECTOMY Right 03/29/2017    Procedure: REMOVAL OF LUNG, OTHER THAN PNEUMONECTOMY; SINGLE LOBE (LOBECTOMY);  Surgeon: Cherie Dark, MD;  Location: [facility];  Service: Thoracic     Current Outpatient Medications on File Prior to Visit   Medication Sig Dispense Refill   ??? albuterol (PROVENTIL HFA;VENTOLIN HFA) 90 mcg/actuation inhaler Inhale 2 puffs every six (6) hours as needed. 1 Inhaler 1   ??? albuterol 2.5 mg/0.5 mL nebulizer solution Inhale 0.5 mL (2.5 mg total) by nebulization every six (6) hours as needed for wheezing. 90 each 3   ??? amikacin liposomal-neb.accessr 590 mg/8.4 mL NbSp INHALE 590 MG DAILY 235.2 mL 11   ??? azithromycin (ZITHROMAX) 250 MG tablet Take 2 tablets (500 mg total) by mouth daily. 60 tablet 11   ??? baclofen (LIORESAL) 20 MG tablet Take 1 tablet (20 mg total) by mouth Three (3) times a day. 90 tablet 6   ??? blood sugar diagnostic Strp Dispense 150 blood glucose test strips, ok to sub any brand preferred by insurance/patient, use up to 5x/day, dx E 08.9 450 strip 12   ??? blood-glucose meter Misc Dispense meter that is preferred by patient's insurance company 1 each 0   ??? blood-glucose meter,continuous (DEXCOM G6 RECEIVER) Misc Use as directed 1 each 0   ??? blood-glucose sensor (DEXCOM G6 SENSOR) Devi Use sq as directed every 14 days 6 Device 11   ??? blood-glucose transmitter (DEXCOM G6 TRANSMITTER) Devi Use as directed 1 Device 11   ??? budesonide-formoterol (SYMBICORT) 160-4.5 mcg/actuation inhaler Inhale 2 puffs Two (2) times a day. 20 g 11   ??? busPIRone (BUSPAR) 30 MG tablet Take 1 tablet (30 mg total) by mouth Two (2) times a day. 60 tablet 6   ??? carvedilol (COREG) 3.125 MG tablet Take 1 tablet (3.125 mg total) by mouth Two (2) times a day. 180 tablet 3   ??? cetirizine (ZYRTEC) 10 MG tablet TAKE 1 TABLET (10 MG TOTAL) BY MOUTH DAILY. 90 tablet 3   ??? CLOFAZIMINE ORAL Take 100 mg by mouth daily with lunch.      ??? diclofenac sodium (VOLTAREN) 1 % gel Apply 2 g topically Four (4) times a day. 100 g 2   ??? dornase alfa (PULMOZYME) 1 mg/mL nebulizer solution Inhale 2.5 mg Two (2) times a day. 450 mL 3   ??? insulin ASPART (NOVOLOG FLEXPEN) 100 unit/mL (3 mL) injection pen Inject  30 units prior to each meal three times per day or as per MD instructions 30 mL 12   ??? insulin glargine (BASAGLAR, LANTUS) 100 unit/mL (3 mL) injection pen Inject 0.6 mL (60 Units total) under the skin daily. 30 mL 12   ??? lamoTRIgine (LAMICTAL) 100 MG tablet Take 100 mg by mouth daily.     ??? lancets Misc Dispense 150 lancets, ok to sub any brand preferred by insurance/patient, use up to 5x/day, dx  E08.9 150 each 12   ??? lipase-protease-amylase (CREON) 36,000-114,000- 180,000 unit CpDR TAKE 7-8 CAPSULES BY MOUTH WITH MEALS AND 5-6 CAPSULES WITH SNACKS 1800 capsule 3   ??? lisinopril (PRINIVIL,ZESTRIL) 10 MG tablet TAKE 1 TABLET BY MOUTH EVERY DAY 90 tablet 1   ??? melatonin 10 mg Tab Take 10 mg by mouth nightly.     ??? montelukast (SINGULAIR) 10 mg tablet Take 1 tablet (10 mg total) by mouth nightly. 90 tablet 3   ??? MVW COMPLETE FORMUL PROBIOTIC 40 billion cell -15 mg CpDR Take 1 capsule by mouth daily. 90 capsule 3   ??? MVW COMPLETE FORMULATION D5000 5,000-800 unit-mcg cap Take 2 capsules by mouth daily. 180 capsule 3   ??? nebulizers Misc Use as directed with inhaled medications 4 each 3   ??? omeprazole (PRILOSEC) 20 MG capsule Take 1 capsule (20 mg total) by mouth daily. 90 capsule 3   ??? pen needle, diabetic (BD ULTRA-FINE NANO PEN NEEDLE) 32 gauge x 5/32 Ndle ok to sub any brand or size needle preferred by insurance/patient, use up to 4x/day, dx E 08.9 450 each 12   ??? pramipexole (MIRAPEX) 0.125 MG tablet Take 0.25 mg by mouth daily.      ??? pregabalin (LYRICA) 300 MG capsule Take 1 capsule (300 mg total) by mouth Two (2) times a day. 60 capsule 0   ??? rivaroxaban (XARELTO) 20 mg tablet Take 1 tablet (20 mg total) by mouth daily with evening meal. 90 tablet 3   ??? sodium bicarb-sodium chloride 700-2,300 mg Pack Irrigate each nostril with 120 mL of solution (1 packet mixed with 240 mL of distilled water) BID     ??? sodium chloride 7% 7 % Nebu Inhale 4 mL by nebulization Two (2) times a day. 720 mL 3   ??? tezacaftor 100mg /ivacaftor 150mg  and ivacaftor 150mg  (SYMDEKO) tablets TAKE BY MOUTH AS DIRECTED ON PACKAGE LABELING 56 each 3   ??? traZODone (DESYREL) 100 MG tablet TAKE 1 TABLET BY MOUTH NIGHTLY 90 tablet 3   ??? vilazodone (VIIBRYD) 40 mg Tab Take 1 tablet (40 mg total) by mouth daily. 90 tablet 0   ??? [DISCONTINUED] insulin detemir U-100 (LEVEMIR FLEXTOUCH U-100 INSULN) 100 unit/mL (3 mL) injection pen Use up to 30 units per day, as per MD instructions 30 mL 12     No current facility-administered medications on file prior to visit.      SOCIAL HISTORY:  Social History     Patient does not qualify to have social determinant information on file (likely too young).   Social History Narrative   ??? Not on file       FAMILY HISTORY:  Family History   Problem Relation Age of Onset   ??? Bipolar disorder Mother    ??? Depression Mother      ROS:  The balance of 10 systems was reviewed and unremarkable except as stated above.        Objective:   BP 117/84  - Pulse 88  - Ht 182.9 cm (6' 0.01)  - Wt (!) 122.6 kg (270 lb 4.8 oz)  - BMI 36.65 kg/m??   CONSTITUTIONAL: Alert and oriented with appropriate mood and affect.   NECK: Supple without carotid bruits, thyromegaly or lymphadenopathy.   HEART: Regular rate and rhythm without murmur, rub, gallop.  ABDOMEN: Soft and nontender without masses.   EXTREMITIES: Showed no edema    REVIEW OF RECENT PERTINENT LABORATORY AND IMAGING DATA:    Lab Results  Component Value Date    A1C 10.7 (H) 04/06/2018    A1C 9.0 (H) 01/07/2018    A1C 6.1 (H) 11/19/2016    A1C 6.3 (H) 11/18/2016     Lab Results   Component Value Date    CREATININE 0.85 04/13/2018     Lab Results   Component Value Date    GFRNONAA >=60 01/08/2018     Lab Results   Component Value Date    GFRAA >=60 01/08/2018     Lab Results   Component Value Date    K 4.3 04/13/2018     No results found for: CHOL  No results found for: LDL  No results found for: HDL  No results found for: TRIG Lab Results   Component Value Date    ALT 69 04/13/2018     No results found for: LABCREA, ALBQTUR, MSHCGMOM, ALBCRERAT  No results found for: VITAMINB12  Lab Results   Component Value Date    TSH 2.120 11/18/2016     Lab Results   Component Value Date    WBC 6.2 04/13/2018    HGB 11.6 (L) 04/13/2018    HCT 36.5 (L) 04/13/2018    PLT 297 04/13/2018     Lab Results   Component Value Date    VITDTOTAL 27.9 03/19/2017     Lab Results   Component Value Date    CALCIUM 9.1 04/13/2018       Assessment/Plan:   1. Diabetes mellitus related to CF (cystic fibrosis) (CMS-HCC)  He's doing well w insulin dosing and CGM though still having significant hyperglycemia on basal prandial regimen  Adjustments and self-titration instructions as below  Also discussed details of pump therapy, he'd be a good candidate, might benefit from some CHO counting for prandial dosing, not critical in my opinion however given ~glycemic stability  Patient Instructions   ? Congratulations on your good results  ? Increase Basaglar to 70 units  ? Increase  Novolog to 34 units  ? OK to adjust Basaglar upward/downward in 10-20% increments every 3-7 days based on pattern  ? Baqsimi glucagon nasal if covered  - Ambulatory referral to Diabetic Education; Future for discussion of pump therapy/options    2. Encounter for long-term (current) use of insulin (CMS-HCC)  As above    3. Obesity  Noted, encouraged dietary efforts, though need to balance against potential for malabsorption/caloric deficits related to CF    Follow up recommended in about 4-6 weeks, CDE hopefully same day as well to discuss pumps, he is aware to contact me or return sooner for interim concerns.

## 2018-06-19 ENCOUNTER — Ambulatory Visit: Admit: 2018-06-19 | Discharge: 2018-06-19 | Payer: PRIVATE HEALTH INSURANCE

## 2018-06-19 ENCOUNTER — Ambulatory Visit
Admit: 2018-06-19 | Discharge: 2018-06-19 | Payer: PRIVATE HEALTH INSURANCE | Attending: "Endocrinology | Primary: "Endocrinology

## 2018-06-19 DIAGNOSIS — E089 Diabetes mellitus due to underlying condition without complications: Secondary | ICD-10-CM

## 2018-06-19 DIAGNOSIS — Z794 Long term (current) use of insulin: Secondary | ICD-10-CM

## 2018-06-19 MED ORDER — GLUCAGON 3 MG/ACTUATION NASAL SPRAY
Freq: Once | NASAL | 11 refills | 0 days | Status: CP | PRN
Start: 2018-06-19 — End: 2018-06-19

## 2018-06-19 MED ORDER — GLUCAGON (HUMAN RECOMBINANT) 1 MG SOLUTION FOR INJECTION
Freq: Once | SUBCUTANEOUS | 11 refills | 0.00000 days | Status: CP | PRN
Start: 2018-06-19 — End: 2019-06-19

## 2018-06-19 NOTE — Unmapped (Signed)
Call from pharmacy requesting to change Glucagon nasal spray to the injection. The nasal spray is not covered by insurance but the injection is covered free of charge. Please advise

## 2018-06-19 NOTE — Unmapped (Signed)
[facility]      [facility]     Jahfari Ambers Trapp is a 32 y.o. male followed by [facility] for his CF. SW met with [name] and his spouse, Nadara Mode, during scheduled clinic visit. Purpose of visit was to check on insurance status and assess for ongoing psychosocial needs. Pt & spouse confirm updated insurance coverage as of 9/1 through spouse's employer Counselling psychologist plan). Information loaded into Epic. [name] had been notified that his [facility] was approved during gap in insurance coverage. PAP had also been approved. They deny additional affordability of care concerns today. SW offered groceries which family acknowledged would be helpful.    CF SW provided a bag of groceries from clinic food pantry today.  Number of people served: 2     Regarding SSI disability, [name] shared that he has been working with [employer] but had some difficulty getting back in touch and requested SW assistance. SW sent email to [facility] [employer], copying [name] and Black, per their request. [employer] Legal responded and has call scheduled with [name] next Tuesday.    Finally, SW inquired about connection to mental health supports. [name] and [name] both feel like [name]'s depression symptoms are improved from last clinic visit but feel that anxiety continues to be a challenge. [name] is trying to work on increasing airway clearance but this has been a challenge given fatigue/ illnesses.     [name] denies any suicidal thoughts, intention, or plan today. He has connected with a provider, Tresa Endo, through [facility] in [place], for medication management. He has had two appointments with her, most recently this week on Nov 5. [name] states that she started him on Vraylar, an antipsychotic, approx a month and a half ago for his depression. He is prescribed 3mg  daily, which he takes at night. He continues to take 40mg  daily of Vibryd, 100mg  daily of Lamictal, and 30 mg BID of Buspar. [name] also shared that prior to establishing care with [facility], his PCP prescribed hydroxyzine, which [facility] promptly discontinued given [name]'s h/o seizures. [name] stated that he took hydroxyzine for approx two weeks.     SW discussed whether [name] would be open to connecting with a therapist as well as a psychiatrist to help manage his symptoms and multiple psychotropic medications. [name] has not yet connected with a therapist but is interested in doing so. SW reviewed [employer] and sent link to local providers to [facility] and [place], per their request. Also revisited clinic [facility] Case Management program.     SW provided update to Dr. [name] pulmonologist and will plan to f/u with [facility] while hospitalized.     Jerl Mina, LCSW  June 19, 2018

## 2018-06-19 NOTE — Unmapped (Addendum)
It was a pleasure to see you today!  ? Congratulations on your good results  ? Increase Basaglar to 70 units  ? Increase  Novolog to 34 units  ? OK to adjust Basaglar upward/downward in 10-20% increments every 3-7 days based on pattern  ? baqsimi glucagon nasal  ?   ?     I have ordered labs and will report them to you via mail or MyChart when results are available.

## 2018-06-19 NOTE — Unmapped (Signed)
Dexcom downloaded in the room. POC glucose done today. Pt is fasting. 199 mg/dL.

## 2018-06-19 NOTE — Unmapped (Signed)
You were seen in clinic today by Dr [name] and Dr Curley Spice  We will call you with results if any labs or imaging ordered.  Please call the clinic if you have any further questions or call 911 for emergency.  Thank you for giving me the opportunity to manage your health.    Between appointments, you can reach [name] at these numbers:    For appointments or the Pulmonary Nurse: [phone]  Fax: [phone]    For urgent issues after hours:  Hospital Operator: [phone], ask for Pulmonary Fellow on call

## 2018-06-19 NOTE — Unmapped (Signed)
I e scripted the injection form of glucagon, thanks  JDostou, MD

## 2018-06-20 NOTE — Unmapped (Addendum)
Pulmonary Clinic - Follow-up Visit      HISTORY:     Interval History:  Diagnosed at 73 with chronic infections, had consolidation and parapneumonic effusion,sent to [facility] for treatment since then.   [id number] and [id number] insertion. 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    Since his last visit with me in August, he was admitted until early September for a cystic fibrosis exacerbation.  His sputum has grown Pseudomonas occasionally, however has not grown AFB since August 2018.  After discharge, he felt better, however in the end of September started feeling weak and fatigued, with a fever, runny nose and some cough with mucus production.  He received Augmentin from his primary care provider for 7 days and had improvement in his symptoms.  In mid October, he had similar symptoms again, and was found positive for strep throat at his primary care doctor's office, and was given a 10-day course of levofloxacin with improvement in his symptoms.    Throughout the course of the last 1 and 1/2 months, despite having improvement in his symptoms intermittently, he has not done any airway clearance whatsoever.  He does not perform the vest therapy.  He only takes his oral medications including his treatment for NTM, pancreatic insufficiency, diabetes and his anti-depressant medications.  He has seen a physician assistant for his depression over the last month and a half.  He was recently started on Vraylar which is a new medication for him along with risperidone, Lamictal and Viibryd.  However, he has never seen a therapist or a psychiatrist over the last 1 year.    He reports lack of motivation even when he is well to perform airway clearance.  Denies any suicidal thoughts or feelings of guilt or sadness or lack of energy otherwise.    Over the last 5 days, he reports feeling weak, fatigued, subjective fevers and chills, runny nose, sore throat and mild cough with minimal expectoration. Mentions that even if he sits up from a chair, he feels that he has ran a marathon.  He is not sure if it is the fatigue that makes him not performance vest therapy or its worsening of his cystic fibrosis due to not performing his device therapy and airway clearance which is causing him to be more fatigued.  He has insight in his disease, and wants to perform better, however is trying to find a middle ground.    He mentions that it is his wife's birthday tomorrow, however he really has no energy and feels like he will need to be admitted again.    He reports having loose watery stools intermittently every 2 to 3 days with abdominal cramping and pain.    He does his breathing treatments including Pulmozyme, hypertonic saline, Pro Air and amikacin nebulization at least once a day however it is still less frequent than what is prescribed.    He continues to use the Xarelto, which was used for provoked and unprovoked DVT and was told that he is to be using it all his life.  Denies any bleeding or bruises.    Barely exercises due to his constant fatigue.        Past Medical History:   Diagnosis Date   ??? Anxiety    ??? Chronic pain disorder    ??? Cystic fibrosis (CMS-HCC)    ??? Depression    ??? Hypertension    ??? Nonproductive cough 04/05/2018     Past Surgical History:  Procedure Laterality Date   ??? PR REMOVAL OF LUNG,LOBECTOMY Right 03/29/2017    Procedure: REMOVAL OF LUNG, OTHER THAN PNEUMONECTOMY; SINGLE LOBE (LOBECTOMY);  Surgeon: Cherie Dark, MD;  Location: MAIN OR [facility];  Service: Thoracic       Other History:  The social history and family history were personally reviewed and updated in the patient's electronic medical record.     Home Medications:  Current Outpatient Medications on File Prior to Visit   Medication Sig Dispense Refill   ??? albuterol (PROVENTIL HFA;VENTOLIN HFA) 90 mcg/actuation inhaler Inhale 2 puffs every six (6) hours as needed. 1 Inhaler 1   ??? albuterol 2.5 mg/0.5 mL nebulizer solution Inhale 0.5 mL (2.5 mg total) by nebulization every six (6) hours as needed for wheezing. 90 each 3   ??? amikacin liposomal-neb.accessr (ARIKAYCE) 590 mg/8.4 mL NbSp Inhale 590 mg daily.     ??? amikacin liposomal-neb.accessr 590 mg/8.4 mL NbSp INHALE 590 MG DAILY 235.2 mL 11   ??? azithromycin (ZITHROMAX) 250 MG tablet Take 2 tablets (500 mg total) by mouth daily. 60 tablet 11   ??? blood sugar diagnostic Strp Dispense 150 blood glucose test strips, ok to sub any brand preferred by insurance/patient, use up to 5x/day, dx E 08.9 450 strip 12   ??? blood-glucose meter Misc Dispense meter that is preferred by patient's insurance company 1 each 0   ??? blood-glucose meter,continuous (DEXCOM G6 RECEIVER) Misc Use as directed 1 each 0   ??? blood-glucose sensor (DEXCOM G6 SENSOR) Devi Use sq as directed every 14 days 6 Device 11   ??? blood-glucose transmitter (DEXCOM G6 TRANSMITTER) Devi Use as directed 1 Device 11   ??? budesonide-formoterol (SYMBICORT) 160-4.5 mcg/actuation inhaler Inhale 2 puffs Two (2) times a day. 20 g 11   ??? busPIRone (BUSPAR) 30 MG tablet Take 1 tablet (30 mg total) by mouth Two (2) times a day. 60 tablet 6   ??? carvedilol (COREG) 3.125 MG tablet Take 1 tablet (3.125 mg total) by mouth Two (2) times a day. 180 tablet 3   ??? cetirizine (ZYRTEC) 10 MG tablet TAKE 1 TABLET (10 MG TOTAL) BY MOUTH DAILY. 90 tablet 3   ??? CLOFAZIMINE ORAL Take 100 mg by mouth daily with lunch.      ??? diclofenac sodium (VOLTAREN) 1 % gel Apply 2 g topically Four (4) times a day. 100 g 2   ??? dornase alfa (PULMOZYME) 1 mg/mL nebulizer solution Inhale 2.5 mg Two (2) times a day. 450 mL 3   ??? gabapentin enacarbil (HORIZANT) 600 mg TbER Take 600 mg by mouth daily with evening meal. Two tablets (total 1,200 mg) at night.     ??? HYDROcodone-acetaminophen (NORCO) 5-325 mg per tablet Take 1 tablet by mouth every six (6) hours as needed for pain.     ??? insulin ASPART (NOVOLOG FLEXPEN) 100 unit/mL (3 mL) injection pen Inject  30 units prior to each meal three times per day or as per MD instructions 30 mL 12   ??? insulin glargine (BASAGLAR, LANTUS) 100 unit/mL (3 mL) injection pen Inject 0.6 mL (60 Units total) under the skin daily. 30 mL 12   ??? lamoTRIgine (LAMICTAL) 100 MG tablet Take 100 mg by mouth daily.     ??? lancets Misc Dispense 150 lancets, ok to sub any brand preferred by insurance/patient, use up to 5x/day, dx E08.9 150 each 12   ??? lipase-protease-amylase (CREON) 36,000-114,000- 180,000 unit CpDR TAKE 7-8 CAPSULES BY MOUTH WITH MEALS AND 5-6  CAPSULES WITH SNACKS 1800 capsule 3   ??? lisinopril (PRINIVIL,ZESTRIL) 10 MG tablet TAKE 1 TABLET BY MOUTH EVERY DAY 90 tablet 1   ??? melatonin 10 mg Tab Take 10 mg by mouth nightly.     ??? montelukast (SINGULAIR) 10 mg tablet Take 1 tablet (10 mg total) by mouth nightly. 90 tablet 3   ??? MVW COMPLETE FORMUL PROBIOTIC 40 billion cell -15 mg CpDR Take 1 capsule by mouth daily. 90 capsule 3   ??? MVW COMPLETE FORMULATION D5000 5,000-800 unit-mcg cap Take 2 capsules by mouth daily. 180 capsule 3   ??? nebulizers Misc Use as directed with inhaled medications 4 each 3   ??? omeprazole (PRILOSEC) 20 MG capsule Take 1 capsule (20 mg total) by mouth daily. 90 capsule 3   ??? pen needle, diabetic (BD ULTRA-FINE NANO PEN NEEDLE) 32 gauge x 5/32 Ndle ok to sub any brand or size needle preferred by insurance/patient, use up to 4x/day, dx E 08.9 450 each 12   ??? pramipexole (MIRAPEX) 0.125 MG tablet Take 0.25 mg by mouth daily.      ??? rivaroxaban (XARELTO) 20 mg tablet Take 1 tablet (20 mg total) by mouth daily with evening meal. 90 tablet 3   ??? sodium chloride 7% 7 % Nebu Inhale 4 mL by nebulization Two (2) times a day. 720 mL 3   ??? tezacaftor 100mg /ivacaftor 150mg  and ivacaftor 150mg  (SYMDEKO) tablets TAKE BY MOUTH AS DIRECTED ON PACKAGE LABELING 56 each 3   ??? traZODone (DESYREL) 100 MG tablet TAKE 1 TABLET BY MOUTH NIGHTLY 90 tablet 3   ??? vilazodone (VIIBRYD) 40 mg Tab Take 1 tablet (40 mg total) by mouth daily. 90 tablet 0   ??? glucagon, human recombinant, (GLUCAGON) 1 mg/ml injection Inject 1 mL (1 mg total) under the skin once as needed (severe hypoglycemia). Follow package directions for low blood sugar. 1 mL 11   ??? [DISCONTINUED] insulin detemir U-100 (LEVEMIR FLEXTOUCH U-100 INSULN) 100 unit/mL (3 mL) injection pen Use up to 30 units per day, as per MD instructions 30 mL 12     No current facility-administered medications on file prior to visit.         Allergies:  Allergies as of 06/19/2018 - Reviewed 06/19/2018   Allergen Reaction Noted   ??? [place] [aztreonam lysine] Anaphylaxis 12/27/2016   ??? Cefepime Itching and Nausea Only 09/06/2015   ??? Other Anaphylaxis and Other (See Comments) 09/06/2015   ??? Slo-bid 100 Anaphylaxis 06/20/2017   ??? Banana Itching and Nausea And Vomiting 07/29/2016   ??? Tobramycin Tinnitus 09/06/2015     Genetics:  [id number] and [id number] insertion  ??  Airway Clearance Regimen:  HS BID  Pulmozyme  ??  Inhaled ABX:  Amikacin  ??  Hemoptysis:   No  ABPA:             No  Ptx:                  No  Sinusitus:       Yes  ??  Panc Insuf:     Yes  PEG:                No  DIOS:              No bowel issues  CF Liver Dz:   No  ??  Diabetes:        Uncontrolled - follows endocrine  Osteopenia:   N/A  ??  Depression:  Yes  ??  Other Co-morbidities:  N/A  ??  Social Setting:  Lives with wife near [place]    Micro History:  ??  MABSC  - Assumed S to erythro from [facility]  06/2017: MASBC not present, but M gordonae is   ??  Smooth PsA  S: Cipro, Levo, Tobra  I: Aztreo, Cefe, Ceftaz, PT, LVX  ??  Stenotrophamonas  R: Ceftat  ??  MABSC (macrolide sensitive)  Last Negative Cx: 03/2017  Last Positive: 02/21/17  ??  ??  Review of Systems:  A comprehensive review of systems was completed and negative except as noted in HPI.    PHYSICAL EXAM:   BP 132/80 (BP Site: L Arm, BP Position: Sitting, BP Cuff Size: Large)  - Pulse 96  - Temp 36.7 ??C (Oral)  - Resp 21  - Ht 183 cm (6' 0.05)  - Wt (!) 121.6 kg (268 lb)  - SpO2 95% Comment: resting on room air - BMI 36.30 kg/m?? Gen: Healthy appearing patient, conscious, cooperative, no pallor, icterus, clubbing or cyanosis.  Eyes: Pupils equal round and reacting light bilaterally  Head neck ENT: moist oral mucosa, neck supple, no JVD or thyromegaly, clear oropharynx  CVS: S1-S2 heard normally, no m/r/g  Respiratory: Bilateral equal breath sounds heard with no adventitious sounds  Abdomen: Soft, nontender, nondistended, no organomegaly, bowel sounds present  Neuro: CN grossly intact, Motor strength 5/5 all four extremities, no sensory deficits.  Skin extremities: Warm, well perfused, no rash, no pedal edema      LABORATORY and RADIOLOGY DATA:     Pulmonary Function Tests:        Pertinent Laboratory Data:  CF Sputum Culture   Date Value Ref Range Status   04/07/2018 OROPHARYNGEAL FLORA ISOLATED  Final   03/17/2018 3+ Oropharyngeal Flora Isolated  Final   03/17/2018 <1+ Smooth Pseudomonas aeruginosa (A)  Final        AFB Smear   Date Value Ref Range Status   04/07/2018  No Acid Fast Bacilli Seen Final    NO ACID FAST BACILLI SEEN- 3 negative smears do not exclude pulmonary TB. If active pulmonary TB is suspected, continue airborne isolation until pulmonary disease is excluded by negative cultures.     AFB Culture   Date Value Ref Range Status   04/07/2018 No Acid Fast Bacilli Detected  Final        IgE, Total   Date Value Ref Range Status   06/20/2017 32.8 4 - 59 kU/L Final        Total Bilirubin   Date Value Ref Range Status   04/13/2018 0.3 0.0 - 1.2 mg/dL Final     AST   Date Value Ref Range Status   04/13/2018 52 19 - 55 U/L Final     ALT   Date Value Ref Range Status   04/13/2018 69 19 - 72 U/L Final     Alkaline Phosphatase   Date Value Ref Range Status   04/13/2018 54 38 - 126 U/L Final        INR   Date Value Ref Range Status   08/19/2017 1.13  Final        Hemoglobin A1C   Date Value Ref Range Status   04/06/2018 10.7 (H) 4.8 - 5.6 % Final     Estimated Average Glucose   Date Value Ref Range Status   04/06/2018 260 mg/dL Final Vitamin A   Date Value Ref Range Status   11/19/2016 44.4 32.5 -  78.0 mcg/dL Final     Comment:        -------------------ADDITIONAL INFORMATION-------------------  This test was developed and its performance characteristics   determined by [facility] in a manner consistent with CLIA   requirements. This test has not been cleared or approved by   the U.S. Food and Drug Administration.     Test Performed by:  [facility]  [address] [place], [place], [place] [address]     Vitamin D Total (25OH)   Date Value Ref Range Status   03/19/2017 27.9 20.0 - 80.0 ng/mL Final            ASSESSMENT and PLAN     Christian Young is a 32 y.o. male with cystic fibrosis comes for follow-up.       Currently, patient has been fighting his own battle between recovering from short CF exacerbations with oral antibiotics and trying to do his airway clearance which he has not been able to accomplish.  He currently also seems to be sick and we possibly feel that he needs to get admitted for IV antibiotics.  He has grown Pseudomonas in the past, which has been intermediately sensitive to cefepime and Zosyn, and sensitive to tobramycin.  Therefore would recommend using tobramycin and either Zosyn or cefepime as an IV agent.    Would also recommend checking for sputum for AFB smear and culture as he has a history of NTM which he is currently on azithromycin, clofazimine and amikacin nebulizer.  If the smear is negative, would recommend the team check a CT scan prior to his discharge.  If the CT scan does not show any new abnormalities, or shows a similar tree-in-bud opacity pattern which was present earlier, would recommend discontinuing his NTM medications prior to discharge.    This would definitely help reduce his pill burden and would help him his compliance for airway clearance.    We also spoke in detail regarding his multiple antidepressant medications and its potential interactions with other medicines that he is on.  We would also like the team to consult psychiatry inpatient, to evaluate his current mental status, and his current medication and opine up on possible interactions and the need for him to be on all of them at the same time.    We have called the medical admissions officer, and once bed is available on med G service, he will be admitted    He will follow-up with me in clinic after his discharge with repeat PFT.      The patient was seen with Dr. Curley Spice and will return to clinic in 3 months    Maia Plan, MD  PGY 4, Pulmonary and Critical Care  Pager: [phone]  June 19, 2018 6:23 PM     ______________________________________________________________________  Teaching Attending Attestation:    I saw the patient with the resident. We formulated a joint history, exam, assessment and plan, as documented in Dr. Dianah Field note. I reviewed the note and agree with the plan and follow-up documented. 32yo with CF, NTM disease, and mental health issues presented with poor adherence to airway clearance therapies related to overwhelming fatigue. Given the number of medications he is on, it is likely that polypharmacy is contributing. We have recommended inclusion in our [facility], but he feels too fatigued to commit to this at this time. Medication review and assessment by psychiatry would likely benefit him. Additionally, his sputum has been clear  of NTM since Aug 2018, with repeated cultures sent. Since our standard treatment for NTM includes continuing therapy for at least 1 year of negative cultures, he is within this window. I would recommend an additional AFB culture, and plan for a high res CT at the conclusion of his hospitalization. I would then stop clofazamine, inhaled amikacin, and azithromycin, and obtain repeat cultures quarterly in clinic.     Harrel Carina, MD  Pulmonary/Critical Care Medicine  Pediatric Pulmonology

## 2018-06-22 ENCOUNTER — Ambulatory Visit
Admit: 2018-06-22 | Discharge: 2018-06-25 | Disposition: A | Discharge status: Home / Self Care | Payer: PRIVATE HEALTH INSURANCE | Admitting: Pulmonary Disease

## 2018-06-22 LAB — CBC W/ AUTO DIFF
BASOPHILS ABSOLUTE COUNT: 0.2 10*9/L — ABNORMAL HIGH (ref 0.0–0.1)
BASOPHILS RELATIVE PERCENT: 2.3 %
EOSINOPHILS ABSOLUTE COUNT: 0.4 10*9/L (ref 0.0–0.4)
EOSINOPHILS RELATIVE PERCENT: 4.7 %
HEMOGLOBIN: 13.3 g/dL — ABNORMAL LOW (ref 13.5–17.5)
LARGE UNSTAINED CELLS: 2 % (ref 0–4)
LYMPHOCYTES ABSOLUTE COUNT: 1.8 10*9/L (ref 1.5–5.0)
LYMPHOCYTES RELATIVE PERCENT: 23.3 %
MEAN CORPUSCULAR HEMOGLOBIN CONC: 32.4 g/dL (ref 31.0–37.0)
MEAN CORPUSCULAR HEMOGLOBIN: 26.5 pg (ref 26.0–34.0)
MEAN CORPUSCULAR VOLUME: 81.8 fL (ref 80.0–100.0)
MEAN PLATELET VOLUME: 8.1 fL (ref 7.0–10.0)
MONOCYTES ABSOLUTE COUNT: 0.5 10*9/L (ref 0.2–0.8)
MONOCYTES RELATIVE PERCENT: 6.9 %
NEUTROPHILS ABSOLUTE COUNT: 4.6 10*9/L (ref 2.0–7.5)
NEUTROPHILS RELATIVE PERCENT: 60.9 %
PLATELET COUNT: 344 10*9/L (ref 150–440)
RED BLOOD CELL COUNT: 5.02 10*12/L (ref 4.50–5.90)
RED CELL DISTRIBUTION WIDTH: 16.4 % — ABNORMAL HIGH (ref 12.0–15.0)
WBC ADJUSTED: 7.6 10*9/L (ref 4.5–11.0)

## 2018-06-22 LAB — COMPREHENSIVE METABOLIC PANEL
ALBUMIN: 3.7 g/dL (ref 3.5–5.0)
ALKALINE PHOSPHATASE: 59 U/L (ref 38–126)
ALT (SGPT): 63 U/L — ABNORMAL HIGH (ref <50)
ANION GAP: 11 mmol/L (ref 7–15)
AST (SGOT): 55 U/L (ref 19–55)
BILIRUBIN TOTAL: 0.5 mg/dL (ref 0.0–1.2)
BLOOD UREA NITROGEN: 12 mg/dL (ref 7–21)
BUN / CREAT RATIO: 16
CALCIUM: 7.9 mg/dL — ABNORMAL LOW (ref 8.5–10.2)
CREATININE: 0.73 mg/dL (ref 0.70–1.30)
EGFR CKD-EPI AA MALE: >90 mL/min/{1.73_m2} (ref >=60)
EGFR CKD-EPI NON-AA MALE: >90 mL/min/{1.73_m2} (ref >=60)
GLUCOSE RANDOM: 332 mg/dL — ABNORMAL HIGH (ref 65–179)
POTASSIUM: 3.9 mmol/L (ref 3.5–5.0)
PROTEIN TOTAL: 6.3 g/dL — ABNORMAL LOW (ref 6.5–8.3)
SODIUM: 134 mmol/L — ABNORMAL LOW (ref 135–145)

## 2018-06-22 LAB — ALT (SGPT): Alanine aminotransferase:CCnc:Pt:Ser/Plas:Qn:: 63 — ABNORMAL HIGH

## 2018-06-22 LAB — MEAN CORPUSCULAR HEMOGLOBIN CONC: Lab: 32.4

## 2018-06-22 NOTE — Unmapped (Addendum)
[facility]  Emergency Department Provider Note    ED Clinical Impression     Final diagnoses:   Cystic fibrosis with pulmonary exacerbation (CMS-HCC) (Primary)       Initial Impression, ED Course, Assessment and Plan     Impression:   Christian Young is a 32 y.o. male with PMH of CF s/p right upper lobectomy in 2008, pancreatic insufficiency, depression and anxiety who presents with a 2 week history of worsening dyspnea on exertion concerning for CF exacerbation.  Given history of DVT, PE considered, however patient has been compliant with taking Xarelto and is not currently hypoxemic so risks of CT PE outweigh benefits at this point.  No suspicion for ACS given patient's age and description of chest pain as noted in HPI. ECG also normal.     Plan:  - Per pulmonology note from 11/7, will start tobramycin and zosyn  - Sputum culture  - CXR  - AFB smear and culture  - Pain control with oxycodone 5 mg q4h prn  - Nebulizers  - CBC and BMP  - Consult Med G for admission    Additional Medical Decision Making     1:20PM   Page to MAO for admission to Med G.  Sputum culture and AFB sputum ordered as requested by primary pulmonologist.    2:40 PM  Have spoken with the Med G resident who plans to admit the patient.  Patient will likely be started on Zosyn and tobramycin given history of Pseudomonas that is susceptible to these agents.  CBC largely unremarkable.  Pain medication provided.  Chest x-ray obtained and no new consolidation noted.    I independently visualized the radiology images.   I reviewed the patient's prior medical records.   I discussed the case with the consultant, admitting provider, radiologist, ED pharmacist etc.     Any labs and radiology results that were available during my care of the patient were independently reviewed by me and considered in my medical decision making.    Portions of this record have been created using Scientist, clinical (histocompatibility and immunogenetics). Dictation errors have been sought, but may not have been identified and corrected.  ____________________________________________    I have reviewed the triage vital signs and the nursing notes.     History     Chief Complaint  Shortness of Breath      HPI   Christian Young is a 32 y.o. male with PMH of CF s/p right upper lobectomy in 2008, pancreatic insufficiency, hx of DVT on xarelto, depression and anxiety who presents with a 2 week history of CF exacerbation.     Initially, the patient noticed SOB two weeks ago. At this time, he had a productive cough with yellow/green sputum production lasting 5 days. After this time, he was no longer producing significant sputum. The patient states he has bilateral chest wall tightness and weakness that is constant.  Locates the pain to the spaces between his ribs on both sides.  It does not radiate to the left arm or jaw.  No associated diaphoresis.  The patient states that his symptoms and this chest pain are similar to his prior CF exacerbations. The patient denies the use of home oxygen. The patient has been using his nebulizers and inhalers with no benefit. He was seen in the pulmonary clinic on 11/7 where he was supposed to be admitted for presumed CF exacerbation.Marland Kitchen However, due to a family emergency, he tried to mange the exacerbation at  home. The patient stated that he was informed to present to the ED if he was not improving. The patient endorses subjective fevers, chills and night sweats.     Upon presentation to the ED, the patient is tachypnic to 23, pulse of 110, afebrile 36.8 C, and SpO2 of 96% on room air.     No headache, congestion, sore throat, abdominal pain, nausea, vomiting, dysuria, constipation or any diarrhea, joint pain, myalgias, or new skin findings.      Past Medical History:   Diagnosis Date   ??? Anxiety    ??? Chronic pain disorder    ??? Cystic fibrosis (CMS-HCC)    ??? Depression    ??? Hypertension    ??? Nonproductive cough 04/05/2018       Patient Active Problem List   Diagnosis   ??? Essential hypertension   ??? History of DVT (deep vein thrombosis)   ??? Depressive disorder, r/o major depression, recurrent, moderate/severe and dysthymic disorder   ??? Mood disorder (CMS-HCC)   ??? Anxiety   ??? Cystic fibrosis with intestinal manifestation (CMS-HCC)   ??? Chronic pansinusitis   ??? Pancreatic insufficiency due to cystic fibrosis (CMS-HCC)   ??? Mycobacterium abscessus infection   ??? Pseudomonas pneumonia (CMS-HCC)   ??? Anticoagulated   ??? Bronchiectasis (CMS-HCC)   ??? Chronic deep vein thrombosis (DVT) of lower extremity (CMS-HCC)   ??? Cystic fibrosis with pulmonary exacerbation (CMS-HCC)   ??? Nonproductive cough   ??? DOE (dyspnea on exertion)       Past Surgical History:   Procedure Laterality Date   ??? PR REMOVAL OF LUNG,LOBECTOMY Right 03/29/2017    Procedure: REMOVAL OF LUNG, OTHER THAN PNEUMONECTOMY; SINGLE LOBE (LOBECTOMY);  Surgeon: Cherie Dark, MD;  Location: MAIN OR [facility];  Service: Thoracic         Current Facility-Administered Medications:   ???  oxyCODONE (ROXICODONE) immediate release tablet 5 mg, 5 mg, Oral, Q4H PRN, Rico Ala, MD    Current Outpatient Medications:   ???  albuterol (PROVENTIL HFA;VENTOLIN HFA) 90 mcg/actuation inhaler, Inhale 2 puffs every six (6) hours as needed., Disp: 1 Inhaler, Rfl: 1  ???  albuterol 2.5 mg/0.5 mL nebulizer solution, Inhale 0.5 mL (2.5 mg total) by nebulization every six (6) hours as needed for wheezing., Disp: 90 each, Rfl: 3  ???  amikacin liposomal-neb.accessr (ARIKAYCE) 590 mg/8.4 mL NbSp, Inhale 590 mg daily., Disp: , Rfl:   ???  amikacin liposomal-neb.accessr 590 mg/8.4 mL NbSp, INHALE 590 MG DAILY, Disp: 235.2 mL, Rfl: 11  ???  azithromycin (ZITHROMAX) 250 MG tablet, Take 2 tablets (500 mg total) by mouth daily., Disp: 60 tablet, Rfl: 11  ???  blood sugar diagnostic Strp, Dispense 150 blood glucose test strips, ok to sub any brand preferred by insurance/patient, use up to 5x/day, dx E 08.9, Disp: 450 strip, Rfl: 12  ???  blood-glucose meter Misc, Dispense meter that is preferred by [employer], Disp: 1 each, Rfl: 0  ???  blood-glucose meter,continuous (DEXCOM G6 RECEIVER) Misc, Use as directed, Disp: 1 each, Rfl: 0  ???  blood-glucose sensor (DEXCOM G6 SENSOR) Devi, Use sq as directed every 14 days, Disp: 6 Device, Rfl: 11  ???  blood-glucose transmitter (DEXCOM G6 TRANSMITTER) Devi, Use as directed, Disp: 1 Device, Rfl: 11  ???  budesonide-formoterol (SYMBICORT) 160-4.5 mcg/actuation inhaler, Inhale 2 puffs Two (2) times a day., Disp: 20 g, Rfl: 11  ???  busPIRone (BUSPAR) 30 MG tablet, Take 1 tablet (30 mg total) by mouth  Two (2) times a day., Disp: 60 tablet, Rfl: 6  ???  carvedilol (COREG) 3.125 MG tablet, Take 1 tablet (3.125 mg total) by mouth Two (2) times a day., Disp: 180 tablet, Rfl: 3  ???  cetirizine (ZYRTEC) 10 MG tablet, TAKE 1 TABLET (10 MG TOTAL) BY MOUTH DAILY., Disp: 90 tablet, Rfl: 3  ???  CLOFAZIMINE ORAL, Take 100 mg by mouth daily with lunch. , Disp: , Rfl:   ???  diclofenac sodium (VOLTAREN) 1 % gel, Apply 2 g topically Four (4) times a day., Disp: 100 g, Rfl: 2  ???  dornase alfa (PULMOZYME) 1 mg/mL nebulizer solution, Inhale 2.5 mg Two (2) times a day., Disp: 450 mL, Rfl: 3  ???  gabapentin enacarbil (HORIZANT) 600 mg TbER, Take 600 mg by mouth daily with evening meal. Two tablets (total 1,200 mg) at night., Disp: , Rfl:   ???  glucagon, human recombinant, (GLUCAGON) 1 mg/ml injection, Inject 1 mL (1 mg total) under the skin once as needed (severe hypoglycemia). Follow package directions for low blood sugar., Disp: 1 mL, Rfl: 11  ???  HYDROcodone-acetaminophen (NORCO) 5-325 mg per tablet, Take 1 tablet by mouth every six (6) hours as needed for pain., Disp: , Rfl:   ???  insulin ASPART (NOVOLOG FLEXPEN) 100 unit/mL (3 mL) injection pen, Inject  30 units prior to each meal three times per day or as per MD instructions, Disp: 30 mL, Rfl: 12  ???  insulin glargine (BASAGLAR, LANTUS) 100 unit/mL (3 mL) injection pen, Inject 0.6 mL (60 Units total) under the skin daily., Disp: 30 mL, Rfl: 12  ???  lamoTRIgine (LAMICTAL) 100 MG tablet, Take 100 mg by mouth daily., Disp: , Rfl:   ???  lancets Misc, Dispense 150 lancets, ok to sub any brand preferred by insurance/patient, use up to 5x/day, dx E08.9, Disp: 150 each, Rfl: 12  ???  lipase-protease-amylase (CREON) 36,000-114,000- 180,000 unit CpDR, TAKE 7-8 CAPSULES BY MOUTH WITH MEALS AND 5-6 CAPSULES WITH SNACKS, Disp: 1800 capsule, Rfl: 3  ???  lisinopril (PRINIVIL,ZESTRIL) 10 MG tablet, TAKE 1 TABLET BY MOUTH EVERY DAY, Disp: 90 tablet, Rfl: 1  ???  melatonin 10 mg Tab, Take 10 mg by mouth nightly., Disp: , Rfl:   ???  montelukast (SINGULAIR) 10 mg tablet, Take 1 tablet (10 mg total) by mouth nightly., Disp: 90 tablet, Rfl: 3  ???  MVW COMPLETE FORMUL PROBIOTIC 40 billion cell -15 mg CpDR, Take 1 capsule by mouth daily., Disp: 90 capsule, Rfl: 3  ???  MVW COMPLETE FORMULATION D5000 5,000-800 unit-mcg cap, Take 2 capsules by mouth daily., Disp: 180 capsule, Rfl: 3  ???  nebulizers Misc, Use as directed with inhaled medications, Disp: 4 each, Rfl: 3  ???  omeprazole (PRILOSEC) 20 MG capsule, Take 1 capsule (20 mg total) by mouth daily., Disp: 90 capsule, Rfl: 3  ???  pen needle, diabetic (BD ULTRA-FINE NANO PEN NEEDLE) 32 gauge x 5/32 Ndle, ok to sub any brand or size needle preferred by insurance/patient, use up to 4x/day, dx E 08.9, Disp: 450 each, Rfl: 12  ???  pramipexole (MIRAPEX) 0.125 MG tablet, Take 0.25 mg by mouth daily. , Disp: , Rfl:   ???  rivaroxaban (XARELTO) 20 mg tablet, Take 1 tablet (20 mg total) by mouth daily with evening meal., Disp: 90 tablet, Rfl: 3  ???  sodium chloride 7% 7 % Nebu, Inhale 4 mL by nebulization Two (2) times a day., Disp: 720 mL, Rfl: 3  ???  tezacaftor  100mg /ivacaftor 150mg  and ivacaftor 150mg  (SYMDEKO) tablets, TAKE BY MOUTH AS DIRECTED ON PACKAGE LABELING, Disp: 56 each, Rfl: 3  ???  traZODone (DESYREL) 100 MG tablet, TAKE 1 TABLET BY MOUTH NIGHTLY, Disp: 90 tablet, Rfl: 3  ???  vilazodone (VIIBRYD) 40 mg Tab, Take 1 tablet (40 mg total) by mouth daily., Disp: 90 tablet, Rfl: 0    Allergies  Cayston [aztreonam lysine]; Cefepime; Other; Slo-bid 100; Banana; and Tobramycin    Family History   Problem Relation Age of Onset   ??? Bipolar disorder Mother    ??? Depression Mother        Social History  Social History     Tobacco Use   ??? Smoking status: Never Smoker   ??? Smokeless tobacco: Never Used   Substance Use Topics   ??? Alcohol use: Not Currently     Comment: rare   ??? Drug use: No       Review of Systems  All other systems have been reviewed and are negative except as otherwise documented.      Physical Exam     ED Triage Vitals [06/22/18 1251]   Enc Vitals Group      BP 138/86      Heart Rate 110      SpO2 Pulse       Resp 23      Temp 36.8 ??C (98.2 ??F)      Temp Source Oral      SpO2 96 %      Weight       Height       Head Circumference       Peak Flow       Pain Score       Pain Loc       Pain Edu?       Excl. in GC?        Constitutional: Alert and oriented. Ill appearing with labored breathing.   Eyes: Conjunctivae are normal.  ENT       Head: Normocephalic and atraumatic.       Nose: No congestion.       Mouth/Throat: Mucous membranes are moist.        Neck: No stridor.  Hematological/Lymphatic/Immunilogical: No cervical lymphadenopathy appreciated.   Cardiovascular: Tachycardic, regular rhythm. Normal and symmetric distal pulses are present in all extremities.  Respiratory: Labored respiratory effort. Hoarse breath sounds, more predominate in the right base. Minimal late-expiratory wheezing appreciated at the bases bilaterally.   Gastrointestinal: Soft and nontender. Non-distended. Normoactive bowel sounds.  Genitourinary: No CVA tenderness.   Musculoskeletal: Normal range of motion in all extremities. Bilaterally anterior chest wall tenderness.        Right lower leg: No tenderness or edema.       Left lower leg: No tenderness or edema.  Neurologic: Normal speech and language. No gross focal neurologic deficits are appreciated. No cranial nerve deficits. Light touch sensation grossly normal. Gait deferred.   Skin: Skin is warm, dry and intact. No rash noted.  Psychiatric: Mood and affect are normal. Speech and behavior are normal.      EKG     Normal ECG. NSR. No change from prior.     Radiology       Xr Chest 2 Views    Result Date: 06/22/2018  EXAM: XR CHEST 2 VIEWS DATE: 06/22/2018 2:29 PM ACCESSION: [id number] UN DICTATED: 06/22/2018 2:31 PM INTERPRETATION LOCATION: [facility] CLINICAL INDICATION: 32 years old Male with CF exacerbation  COMPARISON:  04/05/2018 TECHNIQUE: PA and Lateral Chest Radiographs. FINDINGS: Right-sided implantable venous access device with catheter tip in the right atrium unchanged. Sequela of right upper lobe resection with right apical scarring. No interval consolidation. No pleural effusion or pneumothorax. Unremarkable cardiomediastinal silhouette.     No new consolidation.        I independently visualized these images.    Procedures     Procedure(s) performed: None.    ???I attest that I have reviewed the student note and that the components of the history of the present illness, the physical exam, and the assessment and plan documented were performed by me or were performed in my presence by the student where I verified the documentation and performed (or re-performed) the exam and medical decision making.???        Original Note by:  Jonelle Sports, DMD  MS-3              Rico Ala, MD  Resident  06/22/18 1601       Rico Ala, MD  Resident  06/22/18 [phone]

## 2018-06-22 NOTE — Unmapped (Signed)
Patient rounds completed. The following patient needs were addressed:  Pain, Personal Belongings, Plan of Care, Call Bell in Reach and Bed Position Low .

## 2018-06-22 NOTE — Unmapped (Signed)
Pt coming in with SOB, aches and pains and non-productive cough for the past couple of weeks. Pt has history of cystic fibrosis.

## 2018-06-23 LAB — BASE EXCESS VENOUS: Base excess:SCnc:Pt:BldV:Qn:Calculated: 2.6 — ABNORMAL HIGH

## 2018-06-23 LAB — BASIC METABOLIC PANEL
BLOOD UREA NITROGEN: 12 mg/dL (ref 7–21)
BUN / CREAT RATIO: 14
CHLORIDE: 97 mmol/L — ABNORMAL LOW (ref 98–107)
CO2: 27 mmol/L (ref 22.0–30.0)
CREATININE: 0.86 mg/dL (ref 0.70–1.30)
EGFR CKD-EPI AA MALE: >90 mL/min/{1.73_m2} (ref >=60)
EGFR CKD-EPI NON-AA MALE: >90 mL/min/{1.73_m2} (ref >=60)
GLUCOSE RANDOM: 180 mg/dL — ABNORMAL HIGH (ref 65–179)
POTASSIUM: 4.4 mmol/L (ref 3.5–5.0)
SODIUM: 135 mmol/L (ref 135–145)

## 2018-06-23 LAB — CBC
HEMATOCRIT: 38.6 % — ABNORMAL LOW (ref 41.0–53.0)
HEMOGLOBIN: 12.3 g/dL — ABNORMAL LOW (ref 13.5–17.5)
MEAN CORPUSCULAR HEMOGLOBIN: 25.6 pg — ABNORMAL LOW (ref 26.0–34.0)
MEAN CORPUSCULAR VOLUME: 80.7 fL (ref 80.0–100.0)
MEAN PLATELET VOLUME: 8.3 fL (ref 7.0–10.0)
RED BLOOD CELL COUNT: 4.79 10*12/L (ref 4.50–5.90)
RED CELL DISTRIBUTION WIDTH: 16.5 % — ABNORMAL HIGH (ref 12.0–15.0)
WBC ADJUSTED: 6.7 10*9/L (ref 4.5–11.0)

## 2018-06-23 LAB — BLOOD GAS, VENOUS
BASE EXCESS VENOUS: 2.6 — ABNORMAL HIGH (ref -2.0–2.0)
HCO3 VENOUS: 27 mmol/L (ref 22–27)
O2 SATURATION VENOUS: 75.1 % (ref 40.0–85.0)
PCO2 VENOUS: 44 mmHg (ref 40–60)
PH VENOUS: 7.4 (ref 7.32–7.43)

## 2018-06-23 LAB — TOBRAMYCIN RANDOM
Tobramycin:MCnc:Pt:Ser/Plas:Qn:: 1.5
Tobramycin:MCnc:Pt:Ser/Plas:Qn:: 9.4

## 2018-06-23 LAB — MEAN PLATELET VOLUME: Lab: 8.3

## 2018-06-23 LAB — ANION GAP: Anion gap 3:SCnc:Pt:Ser/Plas:Qn:: 11

## 2018-06-23 NOTE — Unmapped (Signed)
Patient rounds completed. The following patient needs were addressed:  Pain, Personal Belongings, Plan of Care, Call Bell in Reach and Bed Position Low .

## 2018-06-23 NOTE — Unmapped (Signed)
Cystic Fibrosis Nutrition Assessment    Inpatient: Nutrition Risk Screening this admission for CF and related follow up  Primary Pulmonologist: Dr. [name]  ===================================================================  Christian Young is a 32 y.o. male seen for medical nutrition therapy. Currently admitted with CF exacerbation for IV antibiotics.  ===================================================================  INTERVENTION  1. During admission continue Creon 24,000 (11 at meals, 8 at snacks).  - Can resume home Creon 36,000 after discharge.    2. Recommend adjust vitamin regimen:  - STOP adult multivitamin  - START CF vitamin and supplemental vitamin D:  MVW Complete Formulation gel cap 2 daily and 6000 International units vitamin D daily daily  - This will provide a total of 9000 International units vitamin D daily.    - When discharged home resume usual Complete Formulation D5000 softgels, 2 daily. Which provides total of 10,000 International units vitamin D daily.    3. Weigh patient twice weekly this admit.    4. Recommend check PT. If elevated start 5mg  phytonadione daily x 5 days, then recheck PT. If WNL start 5mg  phytonadione twice weekly while on IV antibiotics.    5. Continue remainder of nutrition regimen:  - acid reducer  - high calorie high protein diet   - insulin as appropriate    Inpatient:   Will follow up with patient per protocol: 1-2 times per week (and more frequent as indicated)===================================================================  ASSESSMENT:  Cystic Fibrosis Nutrition Category = Outstanding    Current diet is appropriate for CF. Patient continues to work towards goals for weight management.   Enzyme dose is within established guidelines. Vitamin prescription is not appropriate to reach/maintain optimal fat soluble vitamin levels. Patient would benefit from change in vitamin regimen. Sodium needs for CF met with PO and/or supplement. Bowel regimen is appropriate. Acid reducer appropriate for GERD and enzyme activation.    Malnutrition Assessment using AND/ASPEN Clinical Characteristics:  Patient does not meet AND/ASPEN criteria for malnutrition at this time (06/23/18 1350)    Goals:  1. Meet estimated daily needs: 2900-3388 kcals (24-28 kcals/kg/day); 145-193 gm pro (DRI x 1.5 -2)); 3520 mL free water (Holliday Segar Method)  2. Reach/maintain established goals for CF:                Adult - BMI 22kg/m2 for CF females and 23kg/m2 for CF males  3. Normal fat-soluble vitamin levels: Vitamin A, Vitamin E and PT per lab range; Vitamin D 25OH total >30  4. Maintain glucose control. Carbohydrate content of diet should comprise 40-50% of total calorie needs, but carbohydrates are not restricted in this population.    5.  Meet sodium needs for CF  ===================================================================  INPATIENT:    Current Nutrition Orders (inpatient):       Nutrition Orders   (From admission, onward)             Start     Ordered    06/22/18 1607  Nutrition Therapy General (Regular)  Effective now     Comments:  Double portions   Question:  Nutrition Therapy (T):  Answer:  General (Regular)    06/22/18 1606              CF Nutrition related medications (inpatient): Nutritionally relevant medications reviewed.   Lantus 70 units nightly  HumaLOG  Multivitamin  Creon 24,000 (11 at meals, 8 at snacks) provides 2181 units lipase/kg/meal & 40981 units lipase/kg/day  protonix  Symdeko    CF Nutrition related labs (inpatient):  06-23-18: Glu 180  Recent Labs   Lab Units 06/23/18  0726 06/22/18  2319 06/22/18  1748   POC GLUCOSE mg/dL 161* 096* 045   40-98-11: Glu 332 - elevated    There were no vitals filed for this visit.==================================================================  CLINICAL DATA:  Past Medical History:   Diagnosis Date   ??? Anxiety    ??? Chronic pain disorder    ??? Cystic fibrosis (CMS-HCC)    ??? Depression    ??? Hypertension    ??? Nonproductive cough 04/05/2018   - s/p RUL lobectomy 03-29-17  - Per review of Care Everywhere PMHx also includes: CFRD, PI, essential HTN    Anthroprometric Evaluation:  Weight changes: stable on recent weight documented in CF clinic on 11-7. No new weight documented as of yet this admit.    BMI Readings from Last 1 Encounters:   06/19/18 36.30 kg/m??     Wt Readings from Last 12 Encounters:   06/19/18 (!) 121.6 kg (268 lb)   06/19/18 (!) 122.6 kg (270 lb 4.8 oz)   04/09/18 (!) 120 kg (264 lb 8.8 oz)   03/17/18 (!) 120.2 kg (265 lb)   12/29/17 (!) 119.2 kg (262 lb 12.6 oz)   10/23/17 (!) 123.9 kg (273 lb 3.2 oz)   10/08/17 (!) 124 kg (273 lb 4.8 oz)   09/11/17 (!) 128.4 kg (283 lb)   08/21/17 (!) 119.9 kg (264 lb 4.8 oz)   06/20/17 (!) 122.5 kg (270 lb)   06/04/17 (!) 120.6 kg (265 lb 14 oz)   04/23/17 (!) 118.4 kg (261 lb)     Ht Readings from Last 3 Encounters:   06/19/18 183 cm (6' 0.05)   06/19/18 182.9 cm (6' 0.01)   04/05/18 182.9 cm (6')   ==================================================================  Energy Intake (outpatient):  Diet: High in calories, fat, salt. Diet evaluated this visit. Reports good appetite.  Food allergies: Per review of Care Everywhere note banana is listed as allergy.  Diet and CFTR modulators: Prescribed Symdeko (tezacaftor/ivacaftor).    PO Supplements: none  Appetite Stimulant: none  Enteral feeding tube: n/a  Sodium in diet: Adequate from diet  Calcium in diet:  Adequate from diet     Fat Malabsorption (outpatient):  Enzyme brand, (meals/snacks):  Creon 36,000 (7-8 at meals, 5-6 at snacks)   - Switched from Zenpep to Creon due to report of bloating & greasy stools when on Zenpep.   Enzyme administration details: correct pre-meal administration., moderate compliance, swallows capsules whole  Enzyme dose per MEAL (units lipase/kg/meal) 2082-2380  Enzyme dose per DAY (units lipase/kg/day) 12495  Stools: denies s/s of malabsorption/diarrhea/constipation  Abdominal pain: None reported  Fecal Fat Studies:    Pancreatic Elastase-1   Date Value Ref Range Status   03/19/2017 <15 (L) mcg/g Final     Comment:     Adult and Pediatric Reference Ranges for    Pancreatic Elastase-1:                  Normal:      >200 mcg/g  Moderate Pancreatic        Insufficiency:   100-200 mcg/g    Severe Pancreatic        Insufficiency:      <100 mcg/g     Elastase-1 (E-1) assay results are expressed  in mcg/g, which represent mcg E1/g feces.     It is not necessary to interrupt enzyme  substitution therapy.     Test Performed by:  [facility]  [id number]  [address] [place], [place] [address]     GI meds: Nutritionally relevant medications reviewed. MVW probiotic, omeprazole.    Vitamins/Minerals (outpatient):  CF-specific MVI, dose, compliance: MVW Complete Formulation Softgel D-5000 2 daily  Other vitamins/minerals/herbals: none  Calcium supplement: none  Patient Resources: -[employer] (phone [phone] [email or web address])   - Orders CF vitamin and probiotic directly from manufacturer (MVW), cost billed directly to [employer] grant  - He is thinks he may also be enrolled in [employer] but he is unsure at this time.  Fat-soluble vitamin levels:  Lab Results   Component Value Date/Time    VITAMINA 44.4 11/19/2016 0647     Lab Results   Component Value Date/Time    VITDTOTAL 27.9 03/19/2017 0508        Lab Results   Component Value Date/Time    PT 12.9 (H) 08/19/2017 1340    PT 11.1 03/29/2017 0508    PT 11.5 03/28/2017 0519    PT 14.1 (H) 11/29/2016 0807    PT 15.3 (H) 11/26/2016 0529   03-20-17: normal DesCarboxyPT (also known as PIVKA) indicates no vitamin K deficiency.      Bone Health: unknown     CF Related Diabetes: Yes. Seen by outpatient [facility]in[facility] 06-19-18 re: insulin therapy.        Lab Results   Component Value Date/Time    A1C 10.7 (H) 04/06/2018 8295

## 2018-06-23 NOTE — Unmapped (Signed)
Patient was compliant with all inhaled scheduled medications and airway clearance via Aerobika, tolerated well.

## 2018-06-23 NOTE — Unmapped (Signed)
Medicine History and Physical    Assessment/Plan:    Principal Problem:    DOE (dyspnea on exertion)  Active Problems:    Essential hypertension    History of DVT (deep vein thrombosis)    Depressive disorder, r/o major depression, recurrent, moderate/severe and dysthymic disorder    Mood disorder (CMS-HCC)    Pancreatic insufficiency due to cystic fibrosis (CMS-HCC)    Cystic fibrosis with pulmonary exacerbation (CMS-HCC)  Resolved Problems:    * No resolved hospital problems. *      Christian Young is a 32 y.o. male with PMHx as reviewed in the EMR who presented to [facility] with DOE (dyspnea on exertion).    Increased DOE w/ Non-productive Cough Secondary to CF Exacerbation:??   Patient presents with 1 week of increased dyspnea, more pronounced on exertion.  He notes increased cough that was initially productive, but is now dry.  Subjective fevers.  Symptoms consistent with cystic fibrosis exacerbation.  Previous cultures show smooth Pseudomonas that has intermediate sensitivity to cefepime and Zosyn, but is largely sensitive to tobramycin (though last culture on 8/5 showed intermediate sensitivity to tobramycin).  Patient admits poor compliance with airway clearance regimen.  We will plan to treat exacerbation with both IV antibiotics and airway clearance.  - Zosyn 4.5 g every 6 hours  - Tobramycin IV, dosing per pharmacy  - Airway clearance regimen: Hypertonic saline nebulizer, Pulmozyme twice daily, albuterol nebulization, vest therapy with respiratory therapy  - Continue home Singulair, Zyrtec, Symbicort  - Continue home Symdeko  - Continue home azithromycin 500 mg daily  - Lower respiratory culture  - Repeat AFB smear  - Plan for repeat CT chest at conclusion of hospitalization per primary pulmonologist request    CF related diabetes mellitus:  Patient with history of difficult to control diabetes as sequela of cystic fibrosis.  Followed by endocrine as outpatient.  Will consult endocrine for guidance while inpatient.  Last hemoglobin A1c was 10.7 on 8/25.  - Per outpatient endocrine recommendations, Lantus 70 units nightly and aspart 30 units with meals.   - Consult endocrinology, appreciate recommendations    CF related pancreatic insufficiency:  - Continue home Creon 7 to 8 capsules by mouth with meals and 5 to 6 capsules with snacks    Chronic Conditions:  HTN:  - Continue home lisinopril, Coreg    H/o DVT:  - Continue home Xarelto 10mg  QD    GERD:   - Continue home Protonix 40mg  QD    Anxiety/Depression:  - Continue home the Vilazodone, trazodone, Lyrica, lamotrigine, BuSpar, baclofen    ___________________________________________________________________    Chief Complaint:  Chief Complaint   Patient presents with   ??? Shortness of Breath     DOE (dyspnea on exertion)    HPI:  Christian Young is a 32 y.o. male with PMHx as reviewed in the EMR who presented to [facility] with DOE (dyspnea on exertion).    Christian Young presented to clinic on 11/7 with increased shortness of breath, particularly on exertion.  He noted increased cough that was initially productive of sputum, but has since become dry.  He adds that he is easily winded and feels increased fatigue.  He denies any objective fevers, but notes chills and sweats.  He says his appetite is decreased but his weight remains the same.  He says he has increased sinus pressure and ear pain.  His sinuses are productive of green sputum.    His last hospitalization for CF exacerbation was  from 8/26 to 9/1.  During this previous admission, he was treated for cystic fibrosis exacerbation with IV Zosyn and tobramycin, as well as mechanical clearance with Symbicort, BuSpar, Pulmozyme, hypertonic saline, Singulair, Symdeko).  His airway clearance regimen includes Pulmozyme 2.5 mg twice daily and hypertonic saline nebs twice daily.  He also mentions he is supposed to be doing vest therapy, but is not very diligent about completing this.    Previous sputum cultures show growth of smooth Pseudomonas that has been intermediately sensitive to cefepime and Zosyn, but sensitive to tobramycin (though intermediately sensitive to Tobra on sputum culture from 8/5). Recent PFTs were done on 11/7 and show FEV1 2.98, FVC 4.07, FEV1/FVC 73.14.  Genotype [id number] and [id number] insertion.  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    Patient has a history of NTM and is currently on azithromycin, clofazimine and amikacin nebulizer.  Per 11/7 clinic note, patient should get an AFB culture while inpatient.  He should also have high risk CT performed at the conclusion of his hospitalization.  His clofazimine, inhaled amikacin and azithromycin could then be stopped and he would get repeat cultures quarterly in clinic.    Allergies:  C[place] [aztreonam lysine]; Cefepime; Other; Slo-bid 100; Banana; and Tobramycin    Medications:   Prior to Admission medications    Medication Dose, Route, Frequency   albuterol (PROVENTIL HFA;VENTOLIN HFA) 90 mcg/actuation inhaler 2 puffs, Inhalation, Every 6 hours PRN   albuterol 2.5 mg/0.5 mL nebulizer solution 2.5 mg, Nebulization, Every 6 hours PRN   amikacin liposomal-neb.accessr (ARIKAYCE) 590 mg/8.4 mL NbSp 590 mg, Inhalation, Daily (standard)   amikacin liposomal-neb.accessr 590 mg/8.4 mL NbSp INHALE 590 MG DAILY   azithromycin (ZITHROMAX) 250 MG tablet 500 mg, Oral, Daily (standard)   blood sugar diagnostic Strp Dispense 150 blood glucose test strips, ok to sub any brand preferred by insurance/patient, use up to 5x/day, dx E 08.9   blood-glucose meter Misc Dispense meter that is preferred by patient's insurance company   blood-glucose meter,continuous (DEXCOM G6 RECEIVER) Misc Use as directed   blood-glucose sensor (DEXCOM G6 SENSOR) Devi Use sq as directed every 14 days   blood-glucose transmitter (DEXCOM G6 TRANSMITTER) Devi Use as directed   budesonide-formoterol (SYMBICORT) 160-4.5 mcg/actuation inhaler 2 puffs, Inhalation, 2 times a day (standard)   busPIRone (BUSPAR) 30 MG tablet 30 mg, Oral, 2 times a day (standard)   carvedilol (COREG) 3.125 MG tablet 3.125 mg, Oral, 2 times a day (standard)   cetirizine (ZYRTEC) 10 MG tablet TAKE 1 TABLET (10 MG TOTAL) BY MOUTH DAILY.   CLOFAZIMINE ORAL 100 mg, Oral, Daily with lunch   diclofenac sodium (VOLTAREN) 1 % gel 2 g, Topical, 4 times a day   dornase alfa (PULMOZYME) 1 mg/mL nebulizer solution 2.5 mg, Inhalation, 2 times a day (standard)   gabapentin enacarbil (HORIZANT) 600 mg TbER 600 mg, Oral, Daily, Two tablets (total 1,200 mg) at night.    glucagon, human recombinant, (GLUCAGON) 1 mg/ml injection 1 mg, Subcutaneous, Once as needed, Follow package directions for low blood sugar.   HYDROcodone-acetaminophen (NORCO) 5-325 mg per tablet 1 tablet, Oral, Every 6 hours PRN   insulin ASPART (NOVOLOG FLEXPEN) 100 unit/mL (3 mL) injection pen Inject  30 units prior to each meal three times per day or as per MD instructions   insulin glargine (BASAGLAR, LANTUS) 100 unit/mL (3 mL) injection pen 60 Units, Subcutaneous, Daily (standard)   lamoTRIgine (LAMICTAL) 100 MG tablet 100  mg, Oral, Daily (standard)   lancets Misc Dispense 150 lancets, ok to sub any brand preferred by insurance/patient, use up to 5x/day, dx E08.9   lipase-protease-amylase (CREON) 36,000-114,000- 180,000 unit CpDR TAKE 7-8 CAPSULES BY MOUTH WITH MEALS AND 5-6 CAPSULES WITH SNACKS   lisinopril (PRINIVIL,ZESTRIL) 10 MG tablet TAKE 1 TABLET BY MOUTH EVERY DAY   melatonin 10 mg Tab 10 mg, Oral, Nightly   montelukast (SINGULAIR) 10 mg tablet 10 mg, Oral, Nightly   MVW COMPLETE FORMUL PROBIOTIC 40 billion cell -15 mg CpDR 1 capsule, Oral, Daily (standard)   MVW COMPLETE FORMULATION D5000 5,000-800 unit-mcg cap 2 capsules, Oral, Daily (standard)   nebulizers Misc Use as directed with inhaled medications   omeprazole (PRILOSEC) 20 MG capsule 20 mg, Oral, Daily (standard)   pen needle, diabetic (BD ULTRA-FINE NANO PEN NEEDLE) 32 gauge x 5/32 Ndle ok to sub any brand or size needle preferred by insurance/patient, use up to 4x/day, dx E 08.9   pramipexole (MIRAPEX) 0.125 MG tablet 0.25 mg, Oral, Daily (standard)   rivaroxaban (XARELTO) 20 mg tablet 20 mg, Oral, Daily   sodium chloride 7% 7 % Nebu 4 mL, Nebulization, 2 times a day (standard)   tezacaftor 100mg /ivacaftor 150mg  and ivacaftor 150mg  (SYMDEKO) tablets TAKE BY MOUTH AS DIRECTED ON PACKAGE LABELING   traZODone (DESYREL) 100 MG tablet 100 mg, Oral   vilazodone (VIIBRYD) 40 mg Tab 40 mg, Oral, Daily (standard)   insulin detemir U-100 (LEVEMIR FLEXTOUCH U-100 INSULN) 100 unit/mL (3 mL) injection pen Use up to 30 units per day, as per MD instructions       Medical History:  Past Medical History:   Diagnosis Date   ??? Anxiety    ??? Chronic pain disorder    ??? Cystic fibrosis (CMS-HCC)    ??? Depression    ??? Hypertension    ??? Nonproductive cough 04/05/2018       Surgical History:  Past Surgical History:   Procedure Laterality Date   ??? PR REMOVAL OF LUNG,LOBECTOMY Right 03/29/2017    Procedure: REMOVAL OF LUNG, OTHER THAN PNEUMONECTOMY; SINGLE LOBE (LOBECTOMY);  Surgeon: Cherie Dark, MD;  Location: MAIN OR [facility];  Service: Thoracic       Social History:  Social History     Socioeconomic History   ??? Marital status: Married     Spouse name: Not on file   ??? Number of children: Not on file   ??? Years of education: Not on file   ??? Highest education level: Not on file   Occupational History   ??? Not on file   Social Needs   ??? Financial resource strain: Not on file   ??? Food insecurity:     Worry: Not on file     Inability: Not on file   ??? Transportation needs:     Medical: Not on file     Non-medical: Not on file   Tobacco Use   ??? Smoking status: Never Smoker   ??? Smokeless tobacco: Never Used   Substance and Sexual Activity   ??? Alcohol use: Not Currently     Comment: rare   ??? Drug use: No   ??? Sexual activity: Yes     Partners: Female     Birth control/protection: Condom   Lifestyle ??? Physical activity:     Days per week: Not on file     Minutes per session: Not on file   ??? Stress: Not on file   Relationships   ???  Social connections:     Talks on phone: Not on file     Gets together: Not on file     Attends religious service: Not on file     Active member of club or organization: Not on file     Attends meetings of clubs or organizations: Not on file     Relationship status: Not on file   Other Topics Concern   ??? Not on file   Social History Narrative   ??? Not on file       Family History:  Family History   Problem Relation Age of Onset   ??? Bipolar disorder Mother    ??? Depression Mother        Review of Systems:  10 systems reviewed and are negative unless otherwise mentioned in HPI    Labs/Studies:  Labs and Studies from the last 24hrs per EMR and Reviewed    Physical Exam:  BP 137/86  - Pulse 102  - Temp 36.6 ??C (Oral)  - Resp 22  - SpO2 95% , No intake/output data recorded.    Gen: Tired appearing, nonacute distress; comfortable, resting in bed  HEENT: nontraumatic, normocephalic; extraocular movement intact  CV: RRR, no murmurs, rubs or gallops with normal S1, S2  Pulm: lungs CTABL, no wheezes, rales or rhonchi with good air movement bilaterally; no increased work of breathing  Abd: soft, non-tender, non-distended; no rebound tenderness or guarding  Extrem: Warm, well-perfused; no edema noted  Musculoskeletal: Range of motion intact, normal strength  Skin: No rashes, erythema  Neuro: Alert and oriented x3, no gross focal neurological deficits  Psych: Affect and mood appropriate    E. San Morelle, MD  [facility] Internal Medicine, PGY-1  Pager [phone]

## 2018-06-23 NOTE — Unmapped (Signed)
Aminoglycoside Therapeutic Monitoring Pharmacy Note    Christian Young is a 32 y.o. male starting tobramycin. Date of therapy initiation: 06/22/18    Indication: CF exacerbation    Prior Dosing Information: Previous regimen 250 mg IV q12h (conventional)     Goals:  Therapeutic Drug Levels  ?? Trough level: tobramycin <1 mg/L  ?? Peak level: tobramycin 10-14 mg/L    Additional Clinical Monitoring/Outcomes  Renal function, volume status (intake and output)    Results:   ?? Not applicable   ?? Not applicable    Wt Readings from Last 1 Encounters:   06/19/18 (!) 121.6 kg (268 lb)     Lab Results   Component Value Date    CREATININE 0.73 06/22/2018       Pharmacokinetic Considerations and Significant Drug Interactions:  ? Adult (calculated on 8/27): Vd = 13 L, ke = 0.343 hr-1  ? Concurrent nephrotoxic meds: Zosyn, lisinopril    Assessment/Plan:  Recommendation(s)  ? Start 250 mg IV q12h     Follow-up  ? Level due: in 2 days  ? A pharmacist will continue to monitor and order levels as appropriate    Please page service pharmacist with questions/clarifications.    Laretta Alstrom, PharmD

## 2018-06-23 NOTE — Unmapped (Signed)
Problem: Adult Inpatient Plan of Care  Goal: Plan of Care Review  Outcome: Progressing  Goal: Patient-Specific Goal (Individualization)  Outcome: Progressing  Goal: Absence of Hospital-Acquired Illness or Injury  Outcome: Progressing  Goal: Optimal Comfort and Wellbeing  Outcome: Progressing  Goal: Readiness for Transition of Care  Outcome: Progressing  Goal: Rounds/Family Conference  Outcome: Progressing     Problem: Infection  Goal: Infection Symptom Resolution  Outcome: Progressing     Problem: Infection (Cystic Fibrosis)  Goal: Absence of Infection Signs/Symptoms  Outcome: Progressing     Problem: Oral Intake Inadequate (Cystic Fibrosis)  Goal: Optimal Nutrition Intake  Outcome: Progressing     Problem: Respiratory Compromise (Cystic Fibrosis)  Goal: Effective Oxygenation and Ventilation  Outcome: Progressing    Pt has been afebrile with VS WNL on RA. Lung sounds clear. Pt required PRN pain medication for rib cage pain r/t coughing. IV ABx given on time without issue through functional port. Pt up and walking around room frequently. Contact precautions maintained. Will continue to monitor.

## 2018-06-23 NOTE — Unmapped (Signed)
Care Management  Initial Transition Planning Assessment              General  Care Manager assessed the patient by : In person interview with patient, Medical record review, Discussion with Clinical Care team  Orientation Level: Oriented X4  Who provides care at home?: N/A    Contact/Decision Maker        Extended Emergency Contact Information  Primary Emergency Contact: Griffin,Alyson   [place]  Mobile Phone: [phone]  Relation: Spouse    Legal Next of Kin / Guardian / POA / Advance Directives       Advance Directive (Medical Treatment)  Does patient have an advance directive covering medical treatment?: Patient has advance directive covering medical treatment, copy not in chart.  Advance directive covering medical treatment not in Chart:: Copy requested from family  Reason patient does not have an advance directive covering medical treatment:: Patient does not wish to complete one at this time  Reason there is not a Health Care Decision Maker appointed:: Patient does not wish to appoint a Health Care Decision Maker at this time  Information provided on advance directive:: No  Patient requests assistance:: No    Advance Directive (Mental Health Treatment)  Does patient have an advance directive covering mental health treatment?: Patient would not like information.  Reason patient does not have an advance directive covering mental health treatment:: Patient does not wish to complete one at this time.    Patient Information  Lives with: Spouse/significant other, Parent    Type of Residence: Private residence    Type of Residence: Mailing Address:  [address]  [address]  [place] [address]  Contacts: Accompanied by: Alone  Patient Phone Number: [phone] (home)         Medical Provider(s): [facility]  Reason for Admission: Admitting Diagnosis:  Cystic fibrosis with pulmonary exacerbation (CMS-HCC) [E84.0]  Past Medical History:   has a past medical history of Anxiety, Chronic pain disorder, Cystic fibrosis (CMS-HCC), Depression, Hypertension, and Nonproductive cough (04/05/2018).  Past Surgical History:   has a past surgical history that includes pr removal of lung,lobectomy (Right, 03/29/2017).   Previous admit date: 04/05/2018    Primary Insurance- Payor: Medical sales representative / Plan: [employer] CT GEN CHOICE FUND OA PLUS / Product Type: *No Product type* /   Secondary Insurance ??? None  Prescription Coverage ???   Preferred Pharmacy - [facility] [place] [place] [address]    Transportation home: Private vehicle  Level of function prior to admission: Independent    Support Systems: Family Members, Spouse    Responsibilities/Dependents at home?: No    Home Care services in place prior to admission?: No     Outpatient/Community Resources in place prior to admission: Clinic  Agency detail (Name/Phone #): [facility]    Equipment Currently Used at Home: respiratory supplies, glucometer  Current HME Agency (Name/Phone #): nebulizer, percussion vest    Currently receiving outpatient dialysis?: No     Financial Information       Need for financial assistance?: No     Social Determinants of Health  Social Determinants of Health were addressed in provider documentation.  Please refer to patient history.    Discharge Needs Assessment  Concerns to be Addressed: denies needs/concerns at this time    Clinical Risk Factors: Multiple Diagnoses (Chronic)    Barriers to taking medications: No    Prior overnight hospital  stay or ED visit in last 90 days: Yes    Readmission Within the Last 30 Days: no previous admission in last 30 days    Anticipated Changes Related to Illness: none    Equipment Needed After Discharge: none    Discharge Facility/Level of Care Needs: other (see comments)(Home with self care, possibly [facility])    Readmission  Risk of Unplanned Readmission Score: UNPLANNED READMISSION SCORE: 23%  Readmitted Within the Last 30 Days?        Discharge Plan  Screen findings are: Care Manager reviewed the plan of the patient's care with the Multidisciplinary Team. No discharge planning needs identified at this time. Care Manager will continue to manage plan and monitor patient's progress with the team.    Expected Discharge Date: 06/30/18    Expected Transfer from Critical Care:      Patient and/or family were provided with choice of facilities / services that are available and appropriate to meet post hospital care needs?: Yes   List choices in order highest to lowest preferred, if applicable. : [facility]    Initial Assessment complete?: Yes

## 2018-06-24 LAB — BASIC METABOLIC PANEL
ANION GAP: 8 mmol/L (ref 7–15)
BLOOD UREA NITROGEN: 12 mg/dL (ref 7–21)
CALCIUM: 7.6 mg/dL — ABNORMAL LOW (ref 8.5–10.2)
CHLORIDE: 106 mmol/L (ref 98–107)
CO2: 23 mmol/L (ref 22.0–30.0)
CREATININE: 0.71 mg/dL (ref 0.70–1.30)
EGFR CKD-EPI AA MALE: >90 mL/min/{1.73_m2} (ref >=60)
EGFR CKD-EPI NON-AA MALE: >90 mL/min/{1.73_m2} (ref >=60)
GLUCOSE RANDOM: 159 mg/dL (ref 65–179)
POTASSIUM: 3.7 mmol/L (ref 3.5–5.0)
SODIUM: 137 mmol/L (ref 135–145)

## 2018-06-24 LAB — CBC
HEMATOCRIT: 33.4 % — ABNORMAL LOW (ref 41.0–53.0)
HEMOGLOBIN: 10.6 g/dL — ABNORMAL LOW (ref 13.5–17.5)
MEAN CORPUSCULAR HEMOGLOBIN: 25.8 pg — ABNORMAL LOW (ref 26.0–34.0)
MEAN PLATELET VOLUME: 8.3 fL (ref 7.0–10.0)
PLATELET COUNT: 231 10*9/L (ref 150–440)
RED BLOOD CELL COUNT: 4.09 10*12/L — ABNORMAL LOW (ref 4.50–5.90)
RED CELL DISTRIBUTION WIDTH: 16 % — ABNORMAL HIGH (ref 12.0–15.0)
WBC ADJUSTED: 3.9 10*9/L — ABNORMAL LOW (ref 4.5–11.0)

## 2018-06-24 LAB — CALCIUM: Calcium:MCnc:Pt:Ser/Plas:Qn:: 7.6 — ABNORMAL LOW

## 2018-06-24 LAB — MEAN CORPUSCULAR HEMOGLOBIN CONC: Lab: 31.7

## 2018-06-24 NOTE — Unmapped (Signed)
Med G Daily Progress Note    Principal Problem:    DOE (dyspnea on exertion)  Active Problems:    Essential hypertension    History of DVT (deep vein thrombosis)    Depressive disorder, r/o major depression, recurrent, moderate/severe and dysthymic disorder    Mood disorder (CMS-HCC)    Pancreatic insufficiency due to cystic fibrosis (CMS-HCC)    Cystic fibrosis with pulmonary exacerbation (CMS-HCC)  Resolved Problems:    * No resolved hospital problems. *    24-hours events: NAEON. Patient feels well this morning, ready to leave sooner than usual. Thinks he will recover better at home.    Assessment/Plan:    Christian Young is a 32 y.o. male with PMHx notable for CF, CF-related pancreatic insufficiency and DM, and depression who presented to [facility] with DOE (dyspnea on exertion) in setting of CF exacerbation.  ??  CF Exacerbation w/ Pulmonary Features d/t Bronchiectasis:????  - Zosyn 4.5 g every 6 hours  - Tobramycin IV, dosing per pharmacy  - Airway clearance regimen: Hypertonic saline nebulizer, Pulmozyme twice daily, albuterol nebulization, vest therapy with respiratory therapy QID, exercise bike  - Continue home Singulair, Zyrtec, Symbicort  - Continue home Symdeko  - Continue home azithromycin 500 mg daily, clofazimine  - Lower respiratory culture  - Heating pad, gabapentin, and PRN Norco  ??  History of NTM:  - Repeat AFB smear today  - Plan for repeat CT chest at end of hospitalization   - Continue clofazimine, azithromycin. Per outpatient pulmonology notes, if CT scan doesn't show any new abnormalities, would recommend discontinuing NTM medications prior to discharge    CF-related Diabetes Mellitus:  Patient with history of difficult to control diabetes as sequela of cystic fibrosis. Followed by endocrine as outpatient. Will consult endocrine for guidance while inpatient. Last hemoglobin A1c was 10.7 on 8/25.  - Per outpatient endocrine recommendations, Lantus 70 units nightly and aspart 34 units with meals.   - Consult endocrinology, appreciate recommendations  ??  CF related pancreatic insufficiency:  - Continue home Creon 7 to 8 capsules by mouth with meals and 5 to 6 capsules with snacks  ??  Chronic Conditions:  HTN: Continue home lisinopril, Coreg  H/o DVT: Continue home Xarelto 10mg  QD  GERD: Continue home Protonix 40mg  QD  Anxiety/Depression: Continue home the Vilazodone, trazodone, Lyrica, lamotrigine, BuSpar, baclofen  ___________________________________________________________________    Subjective:  See 24 hours events above    Labs/Studies:  Labs and Studies from the last 24hrs per EMR and Reviewed    Objective:  Temp:  [36.8 ??C] 36.8 ??C  Heart Rate:  [105-108] 105  Resp:  [16-18] 18  BP: (136)/(81) 136/81  SpO2:  [94 %-96 %] 94 %    General: Obese, in NAD; sitting in recliner resting  HEENT: Eyeglasses in place, atraumatic, normocephalic  Pulm: Nl WOB on RA, no conversational dyspnea, R-chest port in place, C/D/I.  Abd: Soft, non-distended; pannus noted  Neuro: Awake and alert x3, no gross focal neurological deficits  Psych: Mood appropriate, affect congruent     E. San Morelle, MD  [facility] Internal Medicine, PGY-1  Pager [phone]

## 2018-06-24 NOTE — Unmapped (Deleted)
Aminoglycoside Therapeutic Monitoring Pharmacy Note    Christian Young is a 32 y.o. male     continuing tobramycin. Date of therapy initiation: 06/22/18    Indication: CF exacerbation    Prior Dosing Information: Current regimen 250 mg IV Q12hrs (conventional) 1 hr infusion      Goals:  Therapeutic Drug Levels  ?? Trough level: tobramycin <1 mg/L  ?? Peak level: tobramycin 10-14 mg/L    Additional Clinical Monitoring/Outcomes  Renal function, volume status (intake and output)    Results:   ?? Trough level: 1.5 mg/L, drawn 5 hrs before next dose (Extrapolated trough: <1 mg/L)   ?? Peak level: 9.4 mg/L, drawn 0.33 hrs after end of infusion (Extrapolated Peak: 10.7 mg/L)    Wt Readings from Last 1 Encounters:   06/19/18 (!) 121.6 kg (268 lb)     Lab Results   Component Value Date    CREATININE 0.86 06/23/2018       Pharmacokinetic Considerations and Significant Drug Interactions:  ? Adult (calculated on 06/23/2018): Vd = 19.6 L, ke = 0.386 hr-1  ? Concurrent nephrotoxic meds: lisinopril, Zosyn    Assessment/Plan:  Recommended Dose  ? Continue current regimen of Tobra 250 mg Q12hrs (1 hr infusion)  ? Estimated peak and trough on recommended regimen: peak = 10.7 mg/L, trough = <1 mg/L    Follow-up  ? Level due: in 3-5 days  ? A pharmacist will continue to monitor and order levels as appropriate    Please page service pharmacist with questions/clarifications.    Burnett Corrente, PharmD Candidate

## 2018-06-24 NOTE — Unmapped (Signed)
Patient did well with doing all of their treatments today. Patient did their airway clearance without complications.

## 2018-06-24 NOTE — Unmapped (Signed)
Med G Daily Progress Note    Principal Problem:    DOE (dyspnea on exertion)  Active Problems:    Essential hypertension    History of DVT (deep vein thrombosis)    Depressive disorder, r/o major depression, recurrent, moderate/severe and dysthymic disorder    Mood disorder (CMS-HCC)    Pancreatic insufficiency due to cystic fibrosis (CMS-HCC)    Cystic fibrosis with pulmonary exacerbation (CMS-HCC)  Resolved Problems:    * No resolved hospital problems. *    24-hours events: NAEON. Patient endorsing diffuse chest pain, worsened with coughing, reports lidocaine patches relief superficial pain but non-effective on internal pain. Would like exercise bike in room.     Assessment/Plan:    Christian Young is a 32 y.o. male that presented to [facility] with DOE (dyspnea on exertion)  Christian Young is a 32 y.o. male with PMHx as reviewed in the EMR who presented to [facility] with DOE (dyspnea on exertion).  ??  CF Exacerbation w/ Pulmonary Features d/t bronchiectasis:????  - Zosyn 4.5 g every 6 hours  - Tobramycin IV, dosing per pharmacy  - Airway clearance regimen: Hypertonic saline nebulizer, Pulmozyme twice daily, albuterol and HTS nebulization, vest therapy with respiratory therapy QID.   - Continue home Singulair, Zyrtec, Symbicort  - Continue home Symdeko  - Continue home azithromycin 500 mg daily, clofazimine  - Lower respiratory culture  - Repeat AFB smear  - Plan for repeat CT chest at conclusion of hospitalization per primary pulmonologist request  - Heating pad, gabapentin, and PRN Norco.   ??  CF related diabetes mellitus:  Patient with history of difficult to control diabetes as sequela of cystic fibrosis.  Followed by endocrine as outpatient.  Will consult endocrine for guidance while inpatient.  Last hemoglobin A1c was 10.7 on 8/25.  - Per outpatient endocrine recommendations, Lantus 70 units nightly and aspart 30 units with meals.   - Consult endocrinology, appreciate recommendations  ??  CF related pancreatic insufficiency:  - Continue home Creon 7 to 8 capsules by mouth with meals and 5 to 6 capsules with snacks  ??  Chronic Conditions:  HTN: Continue home lisinopril, Coreg  H/o DVT: Continue home Xarelto 10mg  QD  GERD: Continue home Protonix 40mg  QD  Anxiety/Depression: Continue home the Vilazodone, trazodone, Lyrica, lamotrigine, BuSpar, baclofen  ??  Latrelle Dodrill PGY2  ___________________________________________________________________    Subjective:  See 24 hours events above    Labs/Studies:  Labs and Studies from the last 24hrs per EMR and Reviewed    Objective:  Temp:  [36.1 ??C] 36.1 ??C  Heart Rate:  [108-111] 108  Resp:  [16-18] 16  BP: (137-141)/(93-98) 141/98  SpO2:  [95 %-96 %] 96 %    General: Obese, in NAD   HEENT: Eyeglasses in place, atraumatic   Cardio: Regular rate, normal rhythm, nl S1 & S2, no MRGs appreciated   Pulm: Nl WOB on RA, CTAB, R-chest port in place, C/D/I.   Neuro: Awake and alert   Psych: Mood appropriate, affect congruent

## 2018-06-24 NOTE — Unmapped (Signed)
Aminoglycoside Therapeutic Monitoring Pharmacy Note    Christian Young is a 32 y.o. male continuing tobramycin. Date of therapy initiation: 06/22/18    Indication: CF exacerbation    Prior Dosing Information: Current regimen tobramycin 250 mg IV q 12 h      Goals:  Therapeutic Drug Levels  ?? Trough level: tobramycin <1 mg/L  ?? Peak level: tobramycin 10-14 mg/L    Additional Clinical Monitoring/Outcomes  Renal function, volume status (intake and output)    Results:   ?? Random level: 1.5 mg/L, drawn 1447 (Extrapolated trough: 0.1 mg/L)   ?? Peak level: 9.4 mg/L, drawn 1006 (Extrapolated Peak: 14.1 mg/L)    Wt Readings from Last 1 Encounters:   06/19/18 (!) 121.6 kg (268 lb)     Lab Results   Component Value Date    CREATININE 0.86 06/23/2018       Pharmacokinetic Considerations and Significant Drug Interactions:  ? Adult (calculated on 11/11): Vd = 16.1 L, ke = 0.41 hr-1  ? Concurrent nephrotoxic meds: Not applicable    Assessment/Plan:  Recommendation(s)  ? Change current regimen to tobramycin 230 mg IV q 12 h  ? Estimated peak and trough on recommended regimen: peak = 13 mg/L, trough = 0.1 mg/L    Follow-up  ? Level due: peak and trough levels around the fourth or fifth dose  ? A pharmacist will continue to monitor and order levels as appropriate    Please page service pharmacist with questions/clarifications.    Crista Curb, PharmD

## 2018-06-24 NOTE — Unmapped (Signed)
No acute events.  No pain endorsed.  OOB independently.  Appetite strong with blood sugars slightly above normal.  IV abx administered as ordered.  Compliant with all nursing care.  VSS.      Problem: Adult Inpatient Plan of Care  Goal: Plan of Care Review  Outcome: Progressing  Goal: Patient-Specific Goal (Individualization)  Outcome: Progressing  Goal: Absence of Hospital-Acquired Illness or Injury  Outcome: Progressing  Goal: Optimal Comfort and Wellbeing  Outcome: Progressing  Goal: Readiness for Transition of Care  Outcome: Progressing  Goal: Rounds/Family Conference  Outcome: Progressing     Problem: Infection  Goal: Infection Symptom Resolution  Outcome: Progressing     Problem: Infection (Cystic Fibrosis)  Goal: Absence of Infection Signs/Symptoms  Outcome: Progressing     Problem: Oral Intake Inadequate (Cystic Fibrosis)  Goal: Optimal Nutrition Intake  Outcome: Progressing     Problem: Respiratory Compromise (Cystic Fibrosis)  Goal: Effective Oxygenation and Ventilation  Outcome: Progressing     Problem: Diabetes Comorbidity  Goal: Blood Glucose Level Within Desired Range  Outcome: Progressing     Problem: Hypertension Comorbidity  Goal: Blood Pressure in Desired Range  Outcome: Progressing     Problem: Pain Chronic (Persistent) (Comorbidity Management)  Goal: Acceptable Pain Control and Functional Ability  Outcome: Progressing

## 2018-06-24 NOTE — Unmapped (Signed)
Pt has no CP distress noted. Ambulates in the room without any SOB, on room air. ABT continued without adverse reaction.Complain of mild headache coming back from CT and tylenol given and effective.   Problem: Adult Inpatient Plan of Care  Goal: Plan of Care Review  Outcome: Progressing  Goal: Patient-Specific Goal (Individualization)  Outcome: Progressing  Flowsheets (Taken 06/24/2018 1515)  Patient-Specific Goals (Include Timeframe): Pt will tolerate activity in the room without SOB.  Goal: Absence of Hospital-Acquired Illness or Injury  Outcome: Progressing  Goal: Optimal Comfort and Wellbeing  Outcome: Progressing  Goal: Readiness for Transition of Care  Outcome: Progressing  Goal: Rounds/Family Conference  Outcome: Progressing     Problem: Infection  Goal: Infection Symptom Resolution  Outcome: Progressing     Problem: Infection (Cystic Fibrosis)  Goal: Absence of Infection Signs/Symptoms  Outcome: Progressing     Problem: Oral Intake Inadequate (Cystic Fibrosis)  Goal: Optimal Nutrition Intake  Outcome: Progressing     Problem: Respiratory Compromise (Cystic Fibrosis)  Goal: Effective Oxygenation and Ventilation  Outcome: Progressing     Problem: Diabetes Comorbidity  Goal: Blood Glucose Level Within Desired Range  Outcome: Progressing     Problem: Hypertension Comorbidity  Goal: Blood Pressure in Desired Range  Outcome: Progressing     Problem: Pain Chronic (Persistent) (Comorbidity Management)  Goal: Acceptable Pain Control and Functional Ability  Outcome: Progressing

## 2018-06-25 LAB — BASIC METABOLIC PANEL
ANION GAP: 8 mmol/L (ref 7–15)
BLOOD UREA NITROGEN: 15 mg/dL (ref 7–21)
BUN / CREAT RATIO: 18
CALCIUM: 8.9 mg/dL (ref 8.5–10.2)
CO2: 28 mmol/L (ref 22.0–30.0)
CREATININE: 0.84 mg/dL (ref 0.70–1.30)
EGFR CKD-EPI AA MALE: >90 mL/min/{1.73_m2} (ref >=60)
EGFR CKD-EPI NON-AA MALE: >90 mL/min/{1.73_m2} (ref >=60)
GLUCOSE RANDOM: 159 mg/dL (ref 65–179)
POTASSIUM: 4.3 mmol/L (ref 3.5–5.0)
SODIUM: 136 mmol/L (ref 135–145)

## 2018-06-25 LAB — CBC
HEMOGLOBIN: 12.7 g/dL — ABNORMAL LOW (ref 13.5–17.5)
MEAN CORPUSCULAR HEMOGLOBIN CONC: 31.5 g/dL (ref 31.0–37.0)
MEAN CORPUSCULAR VOLUME: 81.3 fL (ref 80.0–100.0)
MEAN PLATELET VOLUME: 8.2 fL (ref 7.0–10.0)
PLATELET COUNT: 289 10*9/L (ref 150–440)
RED BLOOD CELL COUNT: 4.95 10*12/L (ref 4.50–5.90)
RED CELL DISTRIBUTION WIDTH: 15.9 % — ABNORMAL HIGH (ref 12.0–15.0)
WBC ADJUSTED: 6 10*9/L (ref 4.5–11.0)

## 2018-06-25 LAB — POTASSIUM: Potassium:SCnc:Pt:Ser/Plas:Qn:: 4.3

## 2018-06-25 LAB — RED BLOOD CELL COUNT: Lab: 4.95

## 2018-06-25 LAB — TOBRAMYCIN RANDOM: Tobramycin:MCnc:Pt:Ser/Plas:Qn:: 3.9

## 2018-06-25 MED ORDER — PIPERACILLIN-TAZOBACTAM 4.5 GRAM/100 ML DEXTROSE(ISO-OSM) IV PIGGYBACK: 5 g | mL | Freq: Four times a day (QID) | 0 refills | 0 days | Status: AC

## 2018-06-25 MED ORDER — TOBRAMYCIN IVPB IN 100 ML (STANDARD DOSING) (OVF): 230 mg | each | Freq: Two times a day (BID) | 0 refills | 0 days | Status: AC

## 2018-06-25 MED ORDER — PIPERACILLIN-TAZOBACTAM 4.5 GRAM/100 ML DEXTROSE(ISO-OSM) IV PIGGYBACK
Freq: Four times a day (QID) | INTRAVENOUS | 0 refills | 0.00000 days | Status: CP
Start: 2018-06-25 — End: 2018-06-25

## 2018-06-25 MED ORDER — TOBRAMYCIN IVPB IN 100 ML (STANDARD DOSING) (OVF)
Freq: Two times a day (BID) | INTRAVENOUS | 0 refills | 0.00000 days | Status: CP
Start: 2018-06-25 — End: 2018-06-25

## 2018-06-25 NOTE — Unmapped (Signed)
Christian Young is a 32 year old male with a past medical history significant for cystic fibrosis (genotype S945L and [id number] insertion; s/p RUL lobectomy, 03/2017), NTM, CF-related diabetes, CF-related pancreatic insufficiency and depression who presented with dyspnea on exertion, consistent with cystic fibrosis exacerbation.    Increased dyspnea on exertion secondary to cystic fibrosis exacerbation:  Patient presents with 1 week of increased dyspnea, more pronounced on exertion.  He had increased cough that was initially productive, but is now dry.  His symptoms are consistent with cystic fibrosis exacerbation.  Last hospitalization for CF exacerbation was from 8/26 to 9/1.  Previous sputum cultures show smooth Pseudomonas that is sensitive to tobramycin and intermediately sensitive to cefepime and Zosyn.  He was restarted on IV Zosyn and tobramycin on 11/11.  He will complete a 14-day course on 11/25 with home IV infusion via port.  His airway clearance regimen was resumed--he takes hypertonic saline nebulization, Pulmozyme twice daily, albuterol nebulization and vest therapy.  He mentioned he is not always fully compliant with his airway clearance regimen at home, so importance of compliance was stressed.  He was continued on home Symdeko, Singulair, Zyrtec and Symbicort.    History of NTM:  Patient has history of NTM, sputum clear since August 2018. He has been on azithromycin, clofazimine, and inhaled amikacin. Repeat cultures have been negative over past year. Patient got repeat AFB cultures x2 while inpatient. AFB smears were negative x2, blood cultures are still in process. He underwent high-resolution CT chest, which was unchanged from previous study. Per Dr. [name], patient is okay to discontinue azithromycin, clofazimine and inhaled amikacin. He will need repeat AFB cultures at least every 3 months.    CF related diabetes mellitus:  Patient has a history of difficult to control diabetes resulting from his cystic fibrosis.  He is followed by endocrinology as an outpatient.  His last hemoglobin A1c was 10.7 (04/06/2018).  Per outpatient endocrinology note from 11/7, patient's home regimen includes Aspart 34 units with meals and Lantus 70 units nightly.  He was maintained on this regimen throughout his hospitalization.

## 2018-06-25 NOTE — Unmapped (Signed)
Patient remains alert and oriented. NORCO given for pain. All due medications given. Will continue POC         Problem: Adult Inpatient Plan of Care  Goal: Plan of Care Review  Outcome: Ongoing - Unchanged  Goal: Patient-Specific Goal (Individualization)  Outcome: Ongoing - Unchanged  Goal: Absence of Hospital-Acquired Illness or Injury  Outcome: Ongoing - Unchanged  Goal: Optimal Comfort and Wellbeing  Outcome: Ongoing - Unchanged  Goal: Readiness for Transition of Care  Outcome: Ongoing - Unchanged  Goal: Rounds/Family Conference  Outcome: Ongoing - Unchanged     Problem: Infection  Goal: Infection Symptom Resolution  Outcome: Ongoing - Unchanged     Problem: Infection (Cystic Fibrosis)  Goal: Absence of Infection Signs/Symptoms  Outcome: Ongoing - Unchanged     Problem: Oral Intake Inadequate (Cystic Fibrosis)  Goal: Optimal Nutrition Intake  Outcome: Ongoing - Unchanged     Problem: Respiratory Compromise (Cystic Fibrosis)  Goal: Effective Oxygenation and Ventilation  Outcome: Ongoing - Unchanged     Problem: Diabetes Comorbidity  Goal: Blood Glucose Level Within Desired Range  Outcome: Ongoing - Unchanged     Problem: Hypertension Comorbidity  Goal: Blood Pressure in Desired Range  Outcome: Ongoing - Unchanged     Problem: Pain Chronic (Persistent) (Comorbidity Management)  Goal: Acceptable Pain Control and Functional Ability  Outcome: Ongoing - Unchanged

## 2018-06-25 NOTE — Unmapped (Addendum)
Disciplines requested: Nursing and Home IV Antibiotics    Physician to follow patient's care (the person listed here will be responsible for signing ongoing orders): Maia Plan    Start of Care Date: 06/23/18  Estimate completion of therapy: 07/07/18  Home health/infusion please reach out to phone contact (below) 2-3 days prior to end of therapy    **FAX RESULTS TO: **  CF Patient: [id number]    *PHONE CONTACT*:  CF Patient: [phone]    ---------------------------------------------------------------------------------------------------------------------------------------------------------------------------  IV ANTIBIOTIC ORDERS:  -Tobramycin 230mg  IV every 12 hours (Conventional dosing, infuse over 30 minutes) - inpatient administration times: 0900, 2100    -Piperacillin-tazobactam 4.5 mg IV every 6 hours (infuse over 30 minutes) - inpatient administration times: 0000, 0600, 1200, 1800    Inpatient Start Date of Antibiotics: 06/23/18    **DO NOT DRAW DRUG LEVELS FROM PORT**    LAB MONITORING ORDERS:  Start labs on 06/26/18  (Tobramycin Conventional Dose and Ceftazidime/Cefepime/Aztreonam/Ceftaroline/Piperacillin-tazobactam): ONCE weekly: CBC with diff, tobramycin trough level on Mondays (1 hour prior to START of infusion time); TWICE weekly: BMP, Mg. Please collect once weekly labs on Mondays and twice weekly labs on Mondays and Thursdays.    Collect on MONDAYS of each week:  BUN and SCr  Magnesium   CBC + diff  Tobramycin Trough Level (collect 1 hour prior to START of infusion time)    Collect on THURSDAYS of each week (only for aminoglycosides)  BUN and SCr  Magnesium  Tobramycin Random Level (collect 7 hour after END of infusion time)    TARGET DRUG LEVELS:  -Tobramycin (Conventional Dosing): Target end of infusion peak: 10-14 mcg/mL, trough < 1 mcg/mL

## 2018-06-25 NOTE — Unmapped (Addendum)
Patient compliant with all inhaled medications and airway clearance with the [place]. Patient is on room air with saturations >=90% throughout the shift. No complications at this time, will continue to monitor.   Problem: Infection  Goal: Infection Symptom Resolution  Intervention: Prevent or Manage Infection  Flowsheets (Taken 06/24/2018 1634)  Infection Management: aseptic technique maintained  Isolation Precautions: contact precautions maintained     Problem: Infection (Cystic Fibrosis)  Goal: Absence of Infection Signs/Symptoms  Intervention: Manage Infection and Prevent Transmission  Flowsheets (Taken 06/24/2018 1634)  Infection Management: aseptic technique maintained  Isolation Precautions: contact precautions maintained     Problem: Respiratory Compromise (Cystic Fibrosis)  Goal: Effective Oxygenation and Ventilation  Intervention: Promote Airway Secretion Clearance  Flowsheets (Taken 06/24/2018 1634)  Breathing Techniques/Airway Clearance: deep/controlled cough encouraged; huff-cough encouraged  Cough And Deep Breathing: done independently per patient  Intervention: Optimize Oxygenation and Ventilation  Flowsheets (Taken 06/24/2018 1634)  Head of Bed (HOB): HOB elevated

## 2018-06-25 NOTE — Unmapped (Addendum)
Physician Discharge Summary [facility]  6 BT [facility]  [address]  [place] [address]  Dept: [phone]  Loc: [phone]     Identifying Information:   Christian Young  November 13, 1985  [id number]    Primary Care Physician: [facility]   Code Status: Full Code    Admit Date: 06/22/2018    Discharge Date: 06/25/2018     Discharge To: Home    Discharge Service: MDG - Pulmonary Admit     Discharge Attending Physician: No att. providers found    Discharge Diagnoses:  Principal Problem:    DOE (dyspnea on exertion) POA: Yes  Active Problems:    Essential hypertension POA: Yes    History of DVT (deep vein thrombosis) POA: Not Applicable    Depressive disorder, r/o major depression, recurrent, moderate/severe and dysthymic disorder POA: Yes    Mood disorder (CMS-HCC) POA: Yes    Pancreatic insufficiency due to cystic fibrosis (CMS-HCC) POA: Yes    Cystic fibrosis with pulmonary exacerbation (CMS-HCC) POA: Yes  Resolved Problems:    * No resolved hospital problems. *    Outpatient Provider Follow Up Issues:   - Follow-up with Dr. [name] for post-hospitalization visit in 2-3 weeks to ensure resolution of CF exacerbation.   - Will need to have AFB smear at least every 3 months.    Hospital Course:   Christian Young is a 32 year old male with a past medical history significant for cystic fibrosis (genotype S945L and [id number] insertion; s/p RUL lobectomy, 03/2017), NTM, CF-related diabetes, CF-related pancreatic insufficiency and depression who presented with dyspnea on exertion, consistent with cystic fibrosis exacerbation.    Increased dyspnea on exertion secondary to cystic fibrosis exacerbation:  Patient presents with 1 week of increased dyspnea, more pronounced on exertion.  He had increased cough that was initially productive, but is now dry.  His symptoms are consistent with cystic fibrosis exacerbation.  Last hospitalization for CF exacerbation was from 8/26 to 9/1.  Previous sputum cultures show smooth Pseudomonas that is sensitive to tobramycin and intermediately sensitive to cefepime and Zosyn.  He was restarted on IV Zosyn and tobramycin on 11/11.  He will complete a 14-day course on 11/25 with home IV infusion via port.  His airway clearance regimen was resumed--he takes hypertonic saline nebulization, Pulmozyme twice daily, albuterol nebulization and vest therapy.  He mentioned he is not always fully compliant with his airway clearance regimen at home, so importance of compliance was stressed.  He was continued on home Symdeko, Singulair, Zyrtec and Symbicort.    History of NTM:  Patient has history of NTM, sputum clear since August 2018. He has been on azithromycin, clofazimine, and inhaled amikacin. Repeat cultures have been negative over past year. Patient got repeat AFB cultures x2 while inpatient. AFB smears were negative x2, blood cultures are still in process. He underwent high-resolution CT chest, which was unchanged from previous study. Per Dr. [name], patient is okay to discontinue azithromycin, clofazimine and inhaled amikacin. He will need repeat AFB cultures at least every 3 months.    CF related diabetes mellitus:  Patient has a history of difficult to control diabetes resulting from his cystic fibrosis.  He is followed by endocrinology as an outpatient.  His last hemoglobin A1c was 10.7 (04/06/2018).  Per outpatient endocrinology note from 11/7, patient's home regimen includes Aspart 34 units with meals and Lantus 70 units nightly.  He was maintained on this regimen throughout his hospitalization.    Touchbase with Outpatient  Provider:  Warm Handoff: Completed on 06/24/18   by Abigail Miyamoto  (Intern) via Face to Face    Procedures:  None  No admission procedures for hospital encounter.  ______________________________________________________________________  Discharge Medications:     Your Medication List      STOP taking these medications    ARIKAYCE 590 mg/8.4 mL Nbsp  Generic drug:  amikacin liposomal-neb.accessr     azithromycin 250 MG tablet  Commonly known as:  ZITHROMAX     CLOFAZIMINE ORAL        START taking these medications    piperacillin-tazobactam 4.5 gram/100 mL Pgbk  Commonly known as:  ZOSYN  Infuse 100 mL (4.5 g total) into a venous catheter Every six (6) hours. for 12 days     tobramycin 230 mg, OVERFILL 10 mL in sodium chloride 0.9 % 100 mL IVPB  Infuse 230 mg into a venous catheter every twelve (12) hours. for 12 days        CONTINUE taking these medications    albuterol 90 mcg/actuation inhaler  Commonly known as:  PROVENTIL HFA;VENTOLIN HFA  Inhale 2 puffs every six (6) hours as needed.     albuterol 2.5 mg/0.5 mL nebulizer solution  Inhale 0.5 mL (2.5 mg total) by nebulization every six (6) hours as needed for wheezing.     blood sugar diagnostic Strp  Dispense 150 blood glucose test strips, ok to sub any brand preferred by insurance/patient, use up to 5x/day, dx E 08.9     blood-glucose meter Misc  Dispense meter that is preferred by patient's insurance company     blood-glucose meter,continuous Misc  Commonly known as:  DEXCOM G6 RECEIVER  Use as directed     blood-glucose sensor Devi  Commonly known as:  DEXCOM G6 SENSOR  Use sq as directed every 14 days     blood-glucose transmitter Devi  Commonly known as:  DEXCOM G6 TRANSMITTER  Use as directed     budesonide-formoterol 160-4.5 mcg/actuation inhaler  Commonly known as:  SYMBICORT  Inhale 2 puffs Two (2) times a day.     busPIRone 30 MG tablet  Commonly known as:  BUSPAR  Take 1 tablet (30 mg total) by mouth Two (2) times a day.     carvedilol 3.125 MG tablet  Commonly known as:  COREG  Take 1 tablet (3.125 mg total) by mouth Two (2) times a day.     cetirizine 10 MG tablet  Commonly known as:  ZyrTEC  TAKE 1 TABLET (10 MG TOTAL) BY MOUTH DAILY.     diclofenac sodium 1 % gel  Commonly known as:  VOLTAREN  Apply 2 g topically Four (4) times a day.     dornase alfa 1 mg/mL nebulizer solution Commonly known as:  PULMOZYME  Inhale 2.5 mg Two (2) times a day.     glucagon (human recombinant) 1 mg/ml injection  Commonly known as:  glucagon  Inject 1 mL (1 mg total) under the skin once as needed (severe hypoglycemia). Follow package directions for low blood sugar.     HORIZANT 600 mg Tber  Generic drug:  gabapentin enacarbil  Take 600 mg by mouth daily with evening meal. Two tablets (total 1,200 mg) at night.     HYDROcodone-acetaminophen 5-325 mg per tablet  Commonly known as:  NORCO  Take 1 tablet by mouth every six (6) hours as needed for pain.     insulin ASPART 100 unit/mL (3 mL) injection pen  Commonly known as:  NovoLOG FLEXPEN  Inject  30 units prior to each meal three times per day or as per MD instructions     insulin glargine 100 unit/mL (3 mL) injection pen  Commonly known as:  BASAGLAR, LANTUS  Inject 0.6 mL (60 Units total) under the skin daily.     Lacto-Bif-Sac-Bacil-Strep-bact 40 billion cell -15 mg Cpdr  Take 1 capsule by mouth daily.     lamoTRIgine 100 MG tablet  Commonly known as:  LaMICtal  Take 100 mg by mouth daily.     lancets Misc  Dispense 150 lancets, ok to sub any brand preferred by insurance/patient, use up to 5x/day, dx E08.9     LC PLUS Misc  Generic drug:  nebulizers  Use as directed with inhaled medications     lipase-protease-amylase 36,000-114,000- 180,000 unit Cpdr  Commonly known as:  CREON  TAKE 7-8 CAPSULES BY MOUTH WITH MEALS AND 5-6 CAPSULES WITH SNACKS     lisinopril 10 MG tablet  Commonly known as:  PRINIVIL,ZESTRIL  TAKE 1 TABLET BY MOUTH EVERY DAY     melatonin 10 mg Tab  Take 10 mg by mouth nightly.     montelukast 10 mg tablet  Commonly known as:  SINGULAIR  Take 1 tablet (10 mg total) by mouth nightly.     omeprazole 20 MG capsule  Commonly known as:  PriLOSEC  Take 1 capsule (20 mg total) by mouth daily.     pediatric multivit 61-D3-vit K 5,000-800 unit-mcg Cap  Take 2 capsules by mouth daily.     pen needle, diabetic 32 gauge x 5/32 Ndle  Commonly known as: BD ULTRA-FINE NANO PEN NEEDLE  ok to sub any brand or size needle preferred by insurance/patient, use up to 4x/day, dx E 08.9     pramipexole 0.125 MG tablet  Commonly known as:  MIRAPEX  Take 0.25 mg by mouth daily.     rivaroxaban 20 mg tablet  Commonly known as:  XARELTO  Take 1 tablet (20 mg total) by mouth daily with evening meal.     sodium chloride 7% 7 % Nebu  Inhale 4 mL by nebulization Two (2) times a day.     SYMDEKO 100-150 mg (d)/ 150 mg (n) tablet  Generic drug:  tezacaftor-ivacaftor  TAKE BY MOUTH AS DIRECTED ON PACKAGE LABELING     traZODone 100 MG tablet  Commonly known as:  DESYREL  TAKE 1 TABLET BY MOUTH NIGHTLY     vilazodone 40 mg Tab  Commonly known as:  VIIBRYD  Take 1 tablet (40 mg total) by mouth daily.          Allergies:  Cayston [aztreonam lysine]; Cefepime; Other; Slo-bid 100; Banana; and Tobramycin  ______________________________________________________________________  Pending Test Results (if blank, then none):   Order Current Status    AFB SMEAR In process    AFB culture In process    AFB culture Preliminary result    CF Sputum/ CF Sinus Culture Preliminary result        Most Recent Labs:  Microbiology -   Microbiology Results (last day)     Procedure Component Value Date/Time Date/Time    AFB SMEAR [[id number]] Collected:  06/23/18 1143    Lab Status:  Final result Specimen:  Sputum, CF from SPUTUM EXPECTORATED Updated:  06/24/18 1515     AFB Smear NO ACID FAST BACILLI SEEN- 3 negative smears do not exclude pulmonary TB. If active pulmonary TB is suspected, continue airborne isolation until pulmonary disease is excluded  by negative cultures.    AFB culture [[id number]] Collected:  06/24/18 1459    Lab Status:  In process Specimen:  Sputum, CF from SPUTUM EXPECTORATED Updated:  06/24/18 1515    AFB SMEAR [[id number]] Collected:  06/24/18 1459    Lab Status:  In process Specimen:  Sputum, CF from SPUTUM EXPECTORATED Updated:  06/24/18 1515    CF Sputum/ CF Sinus Culture [[id number]] Collected:  06/23/18 1143    Lab Status:  Preliminary result Specimen:  Sputum, CF Updated:  06/24/18 1030     CF Sputum Culture Culture in Progress    Narrative:       Specimen Source: Sputum          CBC -   Results in Past 2 Days  Result Component Current Result   WBC 6.0 (06/25/2018)   RBC 4.95 (06/25/2018)   HGB 12.7 (L) (06/25/2018)   HCT 40.2 (L) (06/25/2018)   MCV 81.3 (06/25/2018)   MCH 25.6 (L) (06/25/2018)   MCHC 31.5 (06/25/2018)   MPV 8.2 (06/25/2018)   Platelet 289 (06/25/2018)     BMP -   Results in Past 2 Days  Result Component Current Result   Sodium 136 (06/25/2018)   Potassium 4.3 (06/25/2018)   Chloride 100 (06/25/2018)   CO2 28.0 (06/25/2018)   BUN 15 (06/25/2018)   Creatinine 0.84 (06/25/2018)   EST.GFR (MDRD) Not in Time Range   Glucose 159 (06/25/2018)     ABGs-   No results found for requested labs within last 2 days.     Relevant Studies/Radiology (if blank, then none):  Xr Chest 2 Views    Result Date: 06/22/2018  EXAM: XR CHEST 2 VIEWS DATE: 06/22/2018 2:29 PM ACCESSION: [id number] UN DICTATED: 06/22/2018 2:31 PM INTERPRETATION LOCATION: [facility] CLINICAL INDICATION: 32 years old Male with CF exacerbation  COMPARISON: 04/05/2018 TECHNIQUE: PA and Lateral Chest Radiographs. FINDINGS: Right-sided implantable venous access device with catheter tip in the right atrium unchanged. Sequela of right upper lobe resection with right apical scarring. No interval consolidation. No pleural effusion or pneumothorax. Unremarkable cardiomediastinal silhouette.     No new consolidation.    Ct Chest High Resolution Wo Contrast    Result Date: 06/24/2018  EXAM: CT Chest High Resolution without contrast DATE: 06/24/2018 1:42 PM ACCESSION: [id number] UN DICTATED: 06/24/2018 1:54 PM INTERPRETATION LOCATION: [facility] CLINICAL INDICATION: 32 years old Male with history of NTM  COMPARISON: Chest radiograph dated 06/22/2018, and earlier. TECHNIQUE: Non Contrast High resolution CT Chest was performed with inspiratory, expiratory, and prone series. FINDINGS: Right lower lobe and inferior lingular bronchiectasis. Right lower lobe peripheral mucoid impacted airways and tree-in-bud nodular opacities unchanged. Gas trapping present all lung lobes, but most severe in the right lower lobe. Right upper lobectomy with stable postoperative changes. No consolidation. No pleural effusion. Right-sided Port-A-Cath terminates in the right atrium. Heart size normal. No pericardial effusion. Normal caliber thoracic aorta. No mediastinal lymphadenopathy. Bilateral gynecomastia. Hepatic steatosis. Osseous structures unremarkable.     Chronic small airways disease including gas trapping and peripheral mucoid impacted airways unchanged compared 12/29/2017.    ______________________________________________________________________  Discharge Instructions:   Activity Instructions     Activity as tolerated            Diet Instructions     Discharge diet (specify)      Discharge Nutrition Therapy:  General                   Follow Up instructions and Outpatient Referrals  Call MD for:      Shortness of breath, chest tightness, or any other signs or symptoms of serious illness         Discharge instructions      It was a pleasure taking care of you!    You were admitted to [facility] for a cystic fibrosis exacerbation with shortness of breath on exertion.  You were started on antibiotics, tobramycin and Zosyn.  You will take these antibiotics for 2 weeks.  You will continue your airway clearance regimen, including Pulmozyme, chest vest therapy, hypertonic saline nebulization and exercise.  You got a chest CT and AFB smears that were unremarkable for continued non-tuberculosis mycobacterial colonization.  Your home clofazimine, azithromycin and amikacin should be discontinued.  You will need repeat AFB sputum cultures at least every 3 months.    The following medication changes were made during your hospitalization:  - Stop taking clofazimine, azithromycin and amikacin.  - Start taking Zosyn and tobramycin.  These medications will finish on 07/07/2018.    You should follow-up with the following physicians:  - You should schedule a follow-up with Dr. [name] in 2 weeks for post-hospitalization checkup and to ensure that your exacerbation has resolved.    Please carefully read and follow these instructions below upon your discharge:    1) Please take your medications as prescribed and note the changes listed on your discharge. At future follow-up appointments, please be sure to take all of your medications with you so your provider can better guide your care.     2) Seek medical care with your primary care doctor or local Emergency Room or Urgent Care if you develop any changes in your mental status, worsening abdominal pain, fevers greater than 101.5, any unexplained/unrelieved shortness of breath, uncontrolled nausea and vomiting that keeps you from remaining hydrated or taking your medication, or any other concerning symptoms.     3) Please go to your follow-up appointments. Some of your follow-up appointments have been listed below. If you do not see an appointment listed below with your primary care doctor, please call your doctor's office as soon as possible to schedule an appointment to be seen within 7-10 days of discharge.     4) If you have any concerns before you are able to follow-up with your primary care doctor, you can reach [name] by calling [phone] and asking to page the Med G resident on call.    We wish you all the best!         Referral to Home Infusion      Performing location?:  External    Home Health Requested Disciplines:  Nursing     **Please contact your service pharmacist for assistance with discharge home health infusion monitoring. Please see link above for Laboratory Monitoring Guidelines.      Home Health/Home Infusion Orders:     Agency Details:   Infusion Agency: [facility]  [phone]  [phone] Infusion Start of Care Date: 06/25/18    Nursing Agency: [facility]  Home Health Start of Care Date: 06/26/18  [facility] Skilled Nurse to teach patient/caregiver the administration of home infusion therapy as ordered.   Please teach/review the following, as applicable: how to flush and maintain IV line, medication bag change, storage/disposal of medication/supplies, and inventory control.  Monitor for signs and symptoms of infection, phlebitis, dislodgement/catheter migration, and occlusion of IV line. Instruct to keep IV site clean, dry and intact.   Review medication side effects.   Teach  how to troubleshoot device/IV line function and who to call if dressing gets dirty, wet, or lifts off.   Review and provide the patient/caregiver with contact information regarding who to contact within your agency for questions, concerns or issues.    CVAD Dressing Change:  Please change dressing weekly/prn using sterile CVAD kit/transparent dressing.   Please change Biopatch with CVAD dressing change.   Change the stabilization device (STATLOCK) as applicable with each dressing change.  Please change needleless cap at least weekly with dressing change and prn, and after every blood draw, using aseptic technique.    Note - Generic substitutions permissible by Home Infusion agencies.    IV Access:  Line Type: port-a-cath R chest                 Placement Date: 05/30/17    Special Instructions:     Flush Orders:  Port-a-Cath Adult Patient Port-a-Cath Adult Patient:   Please change Christian Young needle/ dressing weekly/prn. Flush with of Normal Saline before and after each use and prn line maintenance. Use after blood draws.   Flush with Heparin 100units/mL 2 mLs at the end of each use and prn line maintenance.  Please change needleless cap weekly and after blood draws, using aseptic technique.   Please access and flush unused Port with Heparin 100units/mL 2 mLs monthly.  If dual lumen Port, flush both sides.     LABS: Disciplines requested: Nursing and Home IV Antibiotics  ??  Physician to follow patient's care (the person listed here will be responsible for signing ongoing orders): Maia Plan  ??  Start of Care Date: 06/23/18  Estimate completion of therapy: 07/07/18  Home health/infusion please reach out to phone contact (below) 2-3 days prior to end of therapy  ??  **FAX RESULTS TO: **  CF Patient: [phone]  ??  *PHONE CONTACT*:  CF Patient: [phone]  ??  ---------------------------------------------------------------------------------------------------------------------------------------------------------------------------  IV ANTIBIOTIC ORDERS:  -Tobramycin 230mg  IV every 12 hours (Conventional dosing, infuse over 30 minutes) - inpatient administration times: 0900, 2100  ??  -Piperacillin-tazobactam 4.5 mg IV every 6 hours (infuse over 30 minutes) - inpatient administration times: 0000, 0600, 1200, 1800  ??  Inpatient Start Date of Antibiotics: 06/23/18  ??  **DO NOT DRAW DRUG LEVELS FROM PORT**  ??  LAB MONITORING ORDERS:  Start labs on 06/26/18  (Tobramycin Conventional Dose and Ceftazidime/Cefepime/Aztreonam/Ceftaroline/Piperacillin-tazobactam): ONCE weekly: CBC with diff, tobramycin trough level on Mondays (1 hour prior to START of infusion time); TWICE weekly: BMP, Mg. Please collect once weekly labs on Mondays and twice weekly labs on Mondays and Thursdays.  ??  Collect on MONDAYS of each week:  BUN and SCr  Magnesium   CBC + diff  Tobramycin Trough Level (collect 1 hour prior to START of infusion time)  ??  Collect on THURSDAYS of each week (only for aminoglycosides)  BUN and SCr  Magnesium  Tobramycin Random Level (collect 7 hour after END of infusion time)  ??  TARGET DRUG LEVELS:  -Tobramycin (Conventional Dosing): Target end of infusion peak: 10-14 mcg/mL, trough < 1 mcg/mL  ??  ??  ??               Appointments which have been scheduled for you    Jul 31, 2018  9:00 AM EST  (Arrive by 8:45 AM)  RETURN  DIABETES with Harrie Foreman, MD  [facility] DIABETES AND ENDOCRINOLOGY [place] [address]  [address]  [place] [address]  [phone]      Aug 26, 2018 10:45 AM EST  (Arrive by 10:30 AM)  DIABETIC EDUCATION with Jake Shark, RD/LDN  [facility] DIABETES AND ENDOCRINOLOGY [place] [facility]) [address]  [address]  [place] [address]  [phone]           ______________________________________________________________________  Discharge Day Services:  BP 142/85  - Pulse 106  - Temp 36.9 ??C (Oral)  - Resp 18  - SpO2 98%   Pt seen on the day of discharge and determined appropriate for discharge.    Condition at Discharge: stable    Length of Discharge: I spent greater than 30 mins in the discharge of this patient.

## 2018-07-01 NOTE — Unmapped (Signed)
Outpatient Lab Results from November 14, 18, and 21st.     Outpatient IV Antimicrobial Therapy:  Start Date: 06/23/18  End Date: 07/07/18 (D14)   Line: PORT  Referring pulmonologist: Maia Plan    IV Antibiotics Ordered:  -Tobramycin 230mg  IV every 12 hours (Conventional dosing, infuse over 30 minutes)  -Piperacillin-tazobactam (Zosyn) 4.5g IV every 6 hours       GENERAL LABS: see Care Everywhere, [facility] and Media Tab     [facility]     11/21   11/18   11/14 Hospital   11/13   BUN  21 15 17 15    SCR  1.06 1.01 0.95 0.84   MG 1.7 1.9 1.7 1.5 (04/06/18)     CBC    Home  11/18 Hospital  11/13    WBC 6.5 6.0   HGB  14.9 12.7   HCT 46.0 40.2   PLT  365 289     Date andTime: Measured Drug Levels   11/14 @ 1730: Tobramycin Random: 0.7 mcg/mL   11/18 @ 0930: Tobramycin Trough: 0.6 mcg/mL      Patient was called to discuss start and stop time of Tobramycin on date of drug level collection.    AdminTime: 11/14 11/18   Start of infusion:  1000 1000   End of infusion:  1030 1030     PLAN     Delay in receiving labs through [employer]. Spoke wit Zabian this morning. He feels like he is ready to end therapy as scheduled on Monday. Addressed his rising creatinine related to his IV antibiotics. Graysyn would prefer to try to increase his oral intake rather than have IV fluids administered.     Patient is reporting feeling much better after the IV antibiotics. He notes he is sleeping better, hasn't missed a dose of antibiotics. He is coughing less, and has much more energy.     Aware of hospital follow-up with Dr. Sallyanne Kuster on December 5th. Will follow-up with [name] via phone or MyChart should care team want labs on Monday, the last day of therapy.     1. Results note forwarded to: Maia Plan  2. Drug levels forwarded to CPP Michel Santee, RN

## 2018-07-04 NOTE — Unmapped (Signed)
Spoke up with Enid Derry, pharmacist with [facility]. Provided verbal order to increase Brady's tobramycin dose to 250mg  every 12 hours. Enid Derry believes he can get this out to him for today. Confirmed an end date of Monday, with no labs needed on Monday.    Will notify patient of dose change through MyChart.     [name], RN  CF Nurse Coordinator   [phone]

## 2018-07-04 NOTE — Unmapped (Signed)
Emari is agreeable to labs later next week. Happy to have the orders faxed to his PCP, [facility].  BMP ordered and faxed to patient's PCP at [phone].    Patient to have follow-up labs on Wednesday or early the following week.     [name], RN  CF Nurse Coordinator   [phone]

## 2018-07-04 NOTE — Unmapped (Signed)
Pulmonary Outpatient Pharmacist Consult Note: Therapeutic Drug Level Monitoring    Christian Young is a 32 y.o. male on outpatient IV antibiotics for CF exacerbation.      Please see previous Documentation note by Norlene Duel, CF Nurse Coordinator on 07/04/18 for detailed lab results.    Outpatient IV Antimicrobial Therapy:  Start Date: 06/23/18  End Date: 07/07/18    IV Antibiotics Ordered:  -Tobramycin 230mg  IV every 12 hours (Conventional dosing, infuse over 30 minutes)  -Piperacillin-tazobactam (Zosyn) 4.5g IV every 6 hours     Patient Parameters   Patient weight:   Wt Readings from Last 1 Encounters:   06/19/18 (!) 121.6 kg (268 lb)     Drug Administration Time:   06/30/18 1000-1030  06/25/18 0981-1914      Goal Drug Levels:   -Tobramycin (Conventional Dosing): Target end of infusion peak: 10-14 mcg/mL, trough < 1 mcg/mL     Date andTime: Measured Drug Levels   06/30/18 0930: Tobramycin trough: 0.6 mcg/mL Drawn 0.5 hours prior to start of infusion    06/26/18 1730: Tobramycin Random: 0.7 mcg/mL  Drawn 7 hour(s) after end of infusion time   06/25/18 1106: Tobramycin Peak: 3.9 mcg/mL Drawn 1.75 hour(s) after end of infusion time     Patient-Specific Pharmacokinetic Parameters:    Extrapolated end of infusion peak: 6.9 mcg/mL  Extrapolated trough: 0.19 mcg/mL    Vd = 31.6 L (0.26 L/kg), ke = 0.326 hr-1, half-life= 2.12 hr     Recommendations at this time unless clinically indicated otherwise:    -Current levels Subtherapeutic. Using inpatient concentration from 06/25/18. His SCr from that day was lower than this week's SCr but still appropriately clearing medication based on trough on 06/30/18.  -Increase Tobramycin 250mg  IV every 12 hours Conventional dosing (infuse over 30 minutes if dose 300mg ) and piperacillin-tazobactam 4.5g IV every 6 hours.  -Tobramycin dose increase will still likely not get him to therapeutic peaks but he is ending on 07/07/18 and SCr is trending up. His symptoms have much improved per CF nurse and he seems ready to complete on schedule.  -Advised increased hydration due to IV abx regimen of tobra and zosyn.    Labs:  No further follow up labs were ordered as patient due to complete day 14 of IV antibiotics on  07/07/18, will order labs if therapy extended       [name], PharmD, BCPS, CPP  Clinical Pharmacist Practitioner  [facility]  [facility]  Pager: [phone]    Consult note routed back to:   Cyndie Mull, CF Nurse Coordinator

## 2018-07-04 NOTE — Unmapped (Signed)
Addended by: Cicero Duck on: 07/04/2018 12:08 PM     Modules accepted: Orders

## 2018-07-09 NOTE — Unmapped (Signed)
[facility] Refill Coordination Note    Specialty Medication(s) to be Shipped:   CF/Pulmonary: -SYMDEKO (tezacaftor 100mg /ivacaftor 150mg  and ivacaftor 150mg ) tablets    Other medication(s) to be shipped: n/a     Christian Young, DOB: Jun 28, 1986  Phone: [phone] (home)       All above HIPAA information was verified with patient.     Completed refill call assessment today to schedule patient's medication shipment from the [facility] [phone]).       Specialty medication(s) and dose(s) confirmed: Regimen is correct and unchanged.   Changes to medications: Christian Young reports no changes reported at this time.  Changes to insurance: No  Questions for the pharmacist: No    The patient will receive a drug information handout for each medication shipped and additional FDA Medication Guides as required.      DISEASE/MEDICATION-SPECIFIC INFORMATION        For CF patients: CF [facility] Active? Yes    ADHERENCE     Medication Adherence    Patient reported X missed doses in the last month:  0  Specialty Medication:  symdeko  Patient is on additional specialty medications:  No          Support network for adherence:  family member              Refill Coordination    Has the Patients' Contact Information Changed:  No  Is the Shipping Address Different:  No         MEDICARE PART B DOCUMENTATION     Not Applicable    SHIPPING     Shipping address confirmed in Epic.     Delivery Scheduled: Yes, Expected medication delivery date: 07/16/18 via UPS or courier.     Medication will be delivered via UPS to the home address in Epic [place].    Christian Young   [facility] Specialty Technician

## 2018-07-15 MED FILL — SYMDEKO 100 MG-150 MG (DAY)/150 MG (NIGHT) TABLETS: 28 days supply | Qty: 56 | Fill #2

## 2018-07-15 MED FILL — SYMDEKO 100 MG-150 MG (DAY)/150 MG (NIGHT) TABLETS: 28 days supply | Qty: 56 | Fill #2 | Status: AC

## 2018-07-16 MED ORDER — CIPROFLOXACIN 500 MG TABLET
ORAL_TABLET | Freq: Two times a day (BID) | ORAL | 0 refills | 0 days | Status: CP
Start: 2018-07-16 — End: 2018-08-18

## 2018-07-16 NOTE — Unmapped (Signed)
Christian Young left a Engineer, technical sales with the CF nurse coordinator office. States his wife has been really sick and has started on antibiotics He is now reporting similar symptoms and also requesting a call back and start of oral antibiotics.     *note follow-up clinic appointment tomorrow was canceled.      Patient also messaged nurse coordinator:     have not gotten a chance to go get labs because my wife has been very ill, she went to the doctor yesterday got put on an oral antibiotic. But sadly I am starting to have the same symptoms she does. So I am wondering if my dr Shela Commons can call something in for me so I don't get has bad as her?     Spoke with [name]. State his wife went to the doctor, they thought she might have the flu, but didn't get tested, as the window for treatment had passed. Wife was diagnosed with a sinus and horrible ear infection.     Date of symptom onset:  Gradually coming on. Wife was sick last Friday. [name] was okay over the weekend, but had symptoms starting on Monday with nasal congestion, slight ear pain, and cough.     Symptoms include:   Pulmonary-    Increased cough yes, during the day and night    Productive cough yes, a little greener    Hemoptysis  no   Chest pain  no   Chest tightness no, denies SOB,    Fever  no   Chills  no, no chills, but some body aches    Nightsweats yes, no major, but a little    Change in appetite no   Other:  energy level is okay, yellow and green sinus drainage , denies sinus pressure, slightly sore throat believed to be related to post-nasal drip     Current measures they are taken:   Able to perform regular AC & breathing treatments: yes   AC: Vest, two times a day for 30 minutes   Going to start sinus rinses  Nebs: albuterol, saline and Pulmozyme    Requiring additional: occasionally needs inhaler  (tightness and wheezing)   Currently on inhaled abx: no    Should orals be sent in, please use [facility] in [place].    Note routed to Dr. [name].     [name], RN  CF Nurse Coordinator   [phone]

## 2018-07-17 NOTE — Unmapped (Signed)
Patient called Christian Young and mentioned that he has been felling sick, with cough, with runny nose, mucus production and subjective fevers. Wife has been sick and he seems to getting her infection. She is on Augmentin.   No hemoptysis or epistaxis.   No nausea, vomiting, diarrhea.  Can keep food down.  Able to do airway clearance appropriately.      Will send him a script for cipro for 14 days, 500 mg twice a day.    Asked him to get a flu swab at his PCP tom.      Maia Plan, MD  PGY 4, Pulmonary and Critical Care  Pager: [phone]  July 16, 2018 4:43 PM

## 2018-07-23 NOTE — Unmapped (Signed)
[facility]  Adult Cystic Fibrosis Clinic    Patient contacted SW with concerns about taking his medical supplies on an upcoming flight. SW spoke with him about the TSA regulations regarding his medications, port, nebulizer, Dexcom, and insulin. SW advised he have all of his medications and medical supplies with him in his carry-on. Dexcom website also advises patient does not go through body scanning machine or allow the G6 system to go through the x-ray machine.    SW messaged patient links to the TSA and Dexom websites as requested.      Cabrina Shiroma, LCSW

## 2018-08-12 ENCOUNTER — Ambulatory Visit
Admit: 2018-08-12 | Discharge: 2018-08-18 | Disposition: A | Discharge status: Home / Self Care | Payer: PRIVATE HEALTH INSURANCE | Admitting: Internal Medicine

## 2018-08-12 DIAGNOSIS — J471 Bronchiectasis with (acute) exacerbation: Principal | ICD-10-CM

## 2018-08-12 LAB — COMPREHENSIVE METABOLIC PANEL
ALBUMIN: 4.3 g/dL (ref 3.5–5.0)
ALT (SGPT): 65 U/L — ABNORMAL HIGH (ref <50)
ANION GAP: 13 mmol/L (ref 7–15)
AST (SGOT): 48 U/L (ref 19–55)
BILIRUBIN TOTAL: 0.4 mg/dL (ref 0.0–1.2)
BLOOD UREA NITROGEN: 17 mg/dL (ref 7–21)
BUN / CREAT RATIO: 22
CALCIUM: 8.6 mg/dL (ref 8.5–10.2)
CHLORIDE: 102 mmol/L (ref 98–107)
CO2: 20 mmol/L — ABNORMAL LOW (ref 22.0–30.0)
CREATININE: 0.77 mg/dL (ref 0.70–1.30)
EGFR CKD-EPI AA MALE: >90 mL/min/{1.73_m2} (ref >=60)
EGFR CKD-EPI NON-AA MALE: >90 mL/min/{1.73_m2} (ref >=60)
GLUCOSE RANDOM: 240 mg/dL — ABNORMAL HIGH (ref 70–179)
POTASSIUM: 4.4 mmol/L (ref 3.5–5.0)
PROTEIN TOTAL: 7.3 g/dL (ref 6.5–8.3)
SODIUM: 135 mmol/L (ref 135–145)

## 2018-08-12 LAB — BLOOD GAS, VENOUS
BASE EXCESS VENOUS: -4.1 — ABNORMAL LOW (ref -2.0–2.0)
O2 SATURATION VENOUS: 74.8 % (ref 40.0–85.0)
PH VENOUS: 7.41 (ref 7.32–7.43)

## 2018-08-12 LAB — CBC W/ AUTO DIFF
BASOPHILS ABSOLUTE COUNT: 0.1 10*9/L (ref 0.0–0.1)
BASOPHILS RELATIVE PERCENT: 1.2 %
EOSINOPHILS ABSOLUTE COUNT: 0.3 10*9/L (ref 0.0–0.4)
HEMATOCRIT: 38.8 % — ABNORMAL LOW (ref 41.0–53.0)
HEMOGLOBIN: 12.6 g/dL — ABNORMAL LOW (ref 13.5–17.5)
LARGE UNSTAINED CELLS: 2 % (ref 0–4)
LYMPHOCYTES ABSOLUTE COUNT: 2.2 10*9/L (ref 1.5–5.0)
LYMPHOCYTES RELATIVE PERCENT: 28.7 %
MEAN CORPUSCULAR HEMOGLOBIN CONC: 32.5 g/dL (ref 31.0–37.0)
MEAN CORPUSCULAR HEMOGLOBIN: 26.8 pg (ref 26.0–34.0)
MEAN CORPUSCULAR VOLUME: 82.3 fL (ref 80.0–100.0)
MEAN PLATELET VOLUME: 8.2 fL (ref 7.0–10.0)
MONOCYTES ABSOLUTE COUNT: 0.4 10*9/L (ref 0.2–0.8)
MONOCYTES RELATIVE PERCENT: 4.9 %
NEUTROPHILS ABSOLUTE COUNT: 4.5 10*9/L (ref 2.0–7.5)
NEUTROPHILS RELATIVE PERCENT: 58.8 %
PLATELET COUNT: 293 10*9/L (ref 150–440)
RED BLOOD CELL COUNT: 4.71 10*12/L (ref 4.50–5.90)
RED CELL DISTRIBUTION WIDTH: 15.4 % — ABNORMAL HIGH (ref 12.0–15.0)
WBC ADJUSTED: 7.7 10*9/L (ref 4.5–11.0)

## 2018-08-12 LAB — PLATELET COUNT: Lab: 293

## 2018-08-12 LAB — PH VENOUS: pH:LsCnc:Pt:BldV:Qn:: 7.41

## 2018-08-12 LAB — ALT (SGPT): Alanine aminotransferase:CCnc:Pt:Ser/Plas:Qn:: 65 — ABNORMAL HIGH

## 2018-08-12 NOTE — Unmapped (Signed)
Christian Young called to let us know that he plans on coming to [facility] ED this evening for a possible admission for CF exacerbation/sinus infection.  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  Over the last 2 days he has also had increasing SOB on little exertion that takes him awhile to recover from.  He describes having chest pain when coughing, that cough is persistent, and his coughing fits are getting worse.  He has not had hemoptysis or fever.  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  He plans on presenting at [facility] ED in the next hour or so.

## 2018-08-13 LAB — CBC W/ AUTO DIFF
BASOPHILS ABSOLUTE COUNT: 0.1 10*9/L (ref 0.0–0.1)
BASOPHILS RELATIVE PERCENT: 0.7 %
EOSINOPHILS ABSOLUTE COUNT: 0.2 10*9/L (ref 0.0–0.4)
EOSINOPHILS RELATIVE PERCENT: 1.6 %
HEMATOCRIT: 44 % (ref 41.0–53.0)
HEMOGLOBIN: 14.2 g/dL (ref 13.5–17.5)
LARGE UNSTAINED CELLS: 1 % (ref 0–4)
LYMPHOCYTES ABSOLUTE COUNT: 0.4 10*9/L — ABNORMAL LOW (ref 1.5–5.0)
LYMPHOCYTES RELATIVE PERCENT: 3.4 %
MEAN CORPUSCULAR HEMOGLOBIN CONC: 32.3 g/dL (ref 31.0–37.0)
MEAN CORPUSCULAR HEMOGLOBIN: 27 pg (ref 26.0–34.0)
MEAN CORPUSCULAR VOLUME: 83.7 fL (ref 80.0–100.0)
MEAN PLATELET VOLUME: 7.4 fL (ref 7.0–10.0)
MONOCYTES ABSOLUTE COUNT: 0.7 10*9/L (ref 0.2–0.8)
MONOCYTES RELATIVE PERCENT: 5.8 %
NEUTROPHILS ABSOLUTE COUNT: 11.1 10*9/L — ABNORMAL HIGH (ref 2.0–7.5)
NEUTROPHILS RELATIVE PERCENT: 87.5 %
PLATELET COUNT: 256 10*9/L (ref 150–440)
RED BLOOD CELL COUNT: 5.26 10*12/L (ref 4.50–5.90)
RED CELL DISTRIBUTION WIDTH: 15.9 % — ABNORMAL HIGH (ref 12.0–15.0)

## 2018-08-13 LAB — PROTIME: Lab: 14.2 — ABNORMAL HIGH

## 2018-08-13 LAB — BASIC METABOLIC PANEL
ANION GAP: 17 mmol/L — ABNORMAL HIGH (ref 7–15)
BUN / CREAT RATIO: 21
CALCIUM: 10.2 mg/dL (ref 8.5–10.2)
CREATININE: 0.95 mg/dL (ref 0.70–1.30)
EGFR CKD-EPI AA MALE: >90 mL/min/{1.73_m2} (ref >=60)
EGFR CKD-EPI NON-AA MALE: >90 mL/min/{1.73_m2} (ref >=60)
GLUCOSE RANDOM: 195 mg/dL — ABNORMAL HIGH (ref 70–179)
POTASSIUM: 5.1 mmol/L — ABNORMAL HIGH (ref 3.5–5.0)
SODIUM: 136 mmol/L (ref 135–145)

## 2018-08-13 LAB — HEMOGLOBIN A1C: Hemoglobin A1c/Hemoglobin.total:MFr:Pt:Bld:Qn:: 8.5 — ABNORMAL HIGH

## 2018-08-13 LAB — PHOSPHORUS: Phosphate:MCnc:Pt:Ser/Plas:Qn:: 3.9

## 2018-08-13 LAB — MAGNESIUM: Magnesium:MCnc:Pt:Ser/Plas:Qn:: 1.6

## 2018-08-13 LAB — MEAN CORPUSCULAR HEMOGLOBIN: Lab: 27

## 2018-08-13 LAB — EGFR CKD-EPI AA MALE: Lab: >90

## 2018-08-13 LAB — PROTIME-INR: INR: 1.23

## 2018-08-13 NOTE — Unmapped (Signed)
Pt provide breakfast tray.

## 2018-08-13 NOTE — Unmapped (Signed)
Patient rounds completed. The following patient needs were addressed:  Pain, Personal Belongings, Plan of Care, Call Bell in Reach, Bed Position Low and trecv'd pt lying on stretcher asleep in no acute respiratory distress. Marland Kitchen

## 2018-08-13 NOTE — Unmapped (Signed)
Cough for 3 days. Reports green phlegm, chills. Hx CF.

## 2018-08-13 NOTE — Unmapped (Signed)
Bed: 61-D  Expected date:   Expected time:   Means of arrival:   Comments:

## 2018-08-13 NOTE — Unmapped (Addendum)
Patient rounds completed. The following patient needs were addressed:  Personal Belongings, Plan of Care, Call Bell in Reach and Bed Position Low .  Pt comes in from home with 2-3 days of cough and cold sx. Parents at bedside. Pt has CF

## 2018-08-13 NOTE — Unmapped (Signed)
Patient rounds completed. The following patient needs were addressed:  Pain, Personal Belongings, Plan of Care, Call Bell in Reach and Bed Position Low .

## 2018-08-13 NOTE — Unmapped (Signed)
Patient rounding complete, call bell in reach, bed locked and in lowest position, patient belongings at bedside and within reach of patient.  Patient updated on plan of care. Patient waiting for bed; no complaints at this time.

## 2018-08-13 NOTE — Unmapped (Signed)
Pt provide lunch tray

## 2018-08-13 NOTE — Unmapped (Signed)
Breakfast order per nurse

## 2018-08-13 NOTE — Unmapped (Signed)
Aminoglycoside Therapeutic Monitoring Pharmacy Note    Christian Young is a 33 y.o. male starting tobramycin. Date of therapy initiation: 08/13/18    Indication: CF exacerbation    Prior Dosing Information: Previous regimen 230 mg IV q12h (in November 2019) - dose was increased to from 230 mg to 250 mg IV q12h end of November but Scr today is slightly elevated compared to previous (0.77->0.95)    Goals:  Therapeutic Drug Levels  ?? Trough level: tobramycin <1 mg/L  ?? Peak level: tobramycin 10-14 mg/L    Additional Clinical Monitoring/Outcomes  Renal function, volume status (intake and output)    Results:   ?? Not applicable   ?? Not applicable    Wt Readings from Last 1 Encounters:   06/19/18 (!) 121.6 kg (268 lb)     Lab Results   Component Value Date    CREATININE 0.95 08/13/2018       Pharmacokinetic Considerations and Significant Drug Interactions:  ? Concurrent nephrotoxic meds: zosyn    Assessment/Plan:  Recommendation(s)  ? Continue current regimen of 230 mg IV q12h    Follow-up  ? Level due: peak and trough levels around the fourth or fifth dose  ? A pharmacist will continue to monitor and order levels as appropriate    Please page service pharmacist with questions/clarifications.    Donney Rankins, PharmD

## 2018-08-13 NOTE — Unmapped (Signed)
Pulmonology (Med G) History and Physical    Assessment/Plan:    Principal Problem:    Cystic fibrosis with pulmonary exacerbation (CMS-HCC)  Active Problems:    Essential hypertension    Depressive disorder    Anxiety    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)    Pancreatic insufficiency due to cystic fibrosis (CMS-HCC)    History of Mycobacterium abscessus infection    Bronchiectasis (CMS-HCC)    Chronic deep vein thrombosis (DVT) of lower extremity (CMS-HCC)    Cystic fibrosis (CMS-HCC)    Obesity (BMI 30-39.9)    Restless leg syndrome    [name] is a 33 year old man with cystic fibrosis [phone] and [phone] insertion) c/b bronchiectasis, pancreatic insufficiency, and diabetes, s/p RULobectomy (August 2018) for M abscessus infection, HTN, and MDD, and history of DVT, who presented to [facility] on 08/12/18 w/ increased sputum production, likely due to a CF exacerbation.    CF exacerbation w/ pulmonary manifestations w/ acute exacerbation of bronchiectasis  Increased cough and sputum production is concerning for a CF exacerbation. Vitals and labs stable, with the exception of tachycardia (which is normal for him). CXR w/o focal pneumonia. Negative for influenza. Cultures in the past have grown MDR smooth Pseudomonas (I to cipro and Zosyn, S to tobramycin). Suspect that it may now be resistant to ciprofloxacin. Despite exposure to farm animals, low suspicion for zoonotic infection at the moment.  - IV tobramycin 230 mg q12h (08/13/18 - ), dosing by Pharmacy  - IV Zosyn 4.5 g 16h (08/13/18 - )  - f/u RPP and CF sputum culture  - home Symdeko  - Pulmozyme 2.5 mg BID  - montelukast 10 mg qHS  - cetirizine 10 mg daily  - Symbicort 2 puff BID  - Duonebs QID w/ RT  - HTS 7% QID w/ RT  - Aerobika and chest vest QID w/ RT  - gabapentin 400 mg TID    History of Mycobacterium abscessus infection  Required RULobectomy in August 2018. Had been on clofazimine and azithromycin until last admission, then was discontinued. Plan to repeat AFB smears every 3 months (starting February 2020).    CF-related PI  - HPHC diet  - Creon 8 capsules w/ meals, 4 w/ snacks  - MVW 2 capsules daily    CF-related DM  Last A1c 10.7% (August)  - glargine 70 units qHS  - lispro 30 units TID AC  - SSI  - f/u A1c    Depression, anxiety  - vilazodone 40 mg daily  - lamotrigine 100 mg daily  - buspirone 30 mg BID    HTN  - lisinopril 10 mg daily  - carvedilol 3.125 mg BID    History of DVT  - rivaroxaban 20 mg daily    RLS  - pramipexole 0.25 mg daily    Code status: full code    HCDM: Christian Young, wife    Disposition: observation  ___________________________________________________________________    Chief Complaint:  Increased sputum production    HPI:  Christian Young is a 33 year old man with cystic fibrosis ([id number] and [phone] insertion) c/b bronchiectasis, pancreatic insufficiency, and diabetes, s/p RUL lobectomy (August 2018) for M abscessus infection, HTN, and MDD, who presented to [facility] on 08/12/18 w/ increased sputum production.    Christian Young reports that he did well after being discharged on 06/25/18 for an exacerbation, and completed his antibiotics on 11/25. He started feeling sick again on 12/4 with cough, runny nose, and sputum, called his primary  pulmonologist Dr. Maia Plan, and was prescribed 2 weeks of ciprofloxacin. 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 He has a subjective fever, cough, congestion, sore throat, increased sputum production, and shortness of breath. Today, he felt like he was going to pass out a couple times, but did not lose consciousness. He denies any chest pain, abdominal pain, nausea, vomiting, diarrhea, constipation, change in urinary habits, bleeding, or rashes. He has been in close contact with the animals on the farm, including chickens, ducks, pigs, and cows, but denies any bites from them. He denies any tobacco use, drug use, or vaping, and drinks alcohol about once a month. He uses albuterol, HTS, and a chest vest for airway clearance. He currently does not work, but enjoys Adult nurse games.    Allergies:  Cayston [aztreonam lysine]; Cefepime; Other; Slo-bid 100; Banana; and Tobramycin    Medications:   Prior to Admission medications    Medication Dose, Route, Frequency   albuterol (PROVENTIL HFA;VENTOLIN HFA) 90 mcg/actuation inhaler 2 puffs, Inhalation, Every 6 hours PRN   albuterol 2.5 mg/0.5 mL nebulizer solution 2.5 mg, Nebulization, Every 6 hours PRN   blood sugar diagnostic Strp Dispense 150 blood glucose test strips, ok to sub any brand preferred by insurance/patient, use up to 5x/day, dx E 08.9   blood-glucose meter Misc Dispense meter that is preferred by patient's insurance company   blood-glucose meter,continuous (DEXCOM G6 RECEIVER) Misc Use as directed   blood-glucose sensor (DEXCOM G6 SENSOR) Devi Use sq as directed every 14 days   blood-glucose transmitter (DEXCOM G6 TRANSMITTER) Devi Use as directed   budesonide-formoterol (SYMBICORT) 160-4.5 mcg/actuation inhaler 2 puffs, Inhalation, 2 times a day (standard)   busPIRone (BUSPAR) 30 MG tablet 30 mg, Oral, 2 times a day (standard)   carvedilol (COREG) 3.125 MG tablet 3.125 mg, Oral, 2 times a day (standard)   cetirizine (ZYRTEC) 10 MG tablet TAKE 1 TABLET (10 MG TOTAL) BY MOUTH DAILY.   diclofenac sodium (VOLTAREN) 1 % gel 2 g, Topical, 4 times a day   dornase alfa (PULMOZYME) 1 mg/mL nebulizer solution 2.5 mg, Inhalation, 2 times a day (standard)   gabapentin enacarbil (HORIZANT) 600 mg TbER 600 mg, Oral, Daily, Two tablets (total 1,200 mg) at night.    glucagon, human recombinant, (GLUCAGON) 1 mg/ml injection 1 mg, Subcutaneous, Once as needed, Follow package directions for low blood sugar.   HYDROcodone-acetaminophen (NORCO) 5-325 mg per tablet 1 tablet, Oral, Every 6 hours PRN   insulin ASPART (NOVOLOG FLEXPEN) 100 unit/mL (3 mL) injection pen Inject  30 units prior to each meal three times per day or as per MD instructions   insulin glargine (BASAGLAR, LANTUS) 100 unit/mL (3 mL) injection pen 60 Units, Subcutaneous, Daily (standard)   lamoTRIgine (LAMICTAL) 100 MG tablet 100 mg, Oral, Daily (standard)   lancets Misc Dispense 150 lancets, ok to sub any brand preferred by insurance/patient, use up to 5x/day, dx E08.9   lipase-protease-amylase (CREON) 36,000-114,000- 180,000 unit CpDR TAKE 7-8 CAPSULES BY MOUTH WITH MEALS AND 5-6 CAPSULES WITH SNACKS   lisinopril (PRINIVIL,ZESTRIL) 10 MG tablet TAKE 1 TABLET BY MOUTH EVERY DAY   melatonin 10 mg Tab 10 mg, Oral, Nightly   montelukast (SINGULAIR) 10 mg tablet 10 mg, Oral, Nightly   MVW COMPLETE FORMUL PROBIOTIC 40 billion cell -15 mg CpDR 1 capsule, Oral, Daily (standard)   MVW COMPLETE FORMULATION D5000 5,000-800 unit-mcg cap 2 capsules,  Oral, Daily (standard)   nebulizers Misc Use as directed with inhaled medications   omeprazole (PRILOSEC) 20 MG capsule 20 mg, Oral, Daily (standard)   pen needle, diabetic (BD ULTRA-FINE NANO PEN NEEDLE) 32 gauge x 5/32 Ndle ok to sub any brand or size needle preferred by insurance/patient, use up to 4x/day, dx E 08.9   pramipexole (MIRAPEX) 0.125 MG tablet 0.25 mg, Oral, Daily (standard)   rivaroxaban (XARELTO) 20 mg tablet 20 mg, Oral, Daily   sodium chloride 7% 7 % Nebu 4 mL, Nebulization, 2 times a day (standard)   tezacaftor 100mg /ivacaftor 150mg  and ivacaftor 150mg  (SYMDEKO) tablets TAKE BY MOUTH AS DIRECTED ON PACKAGE LABELING   traZODone (DESYREL) 100 MG tablet 100 mg, Oral   vilazodone (VIIBRYD) 40 mg Tab 40 mg, Oral, Daily (standard)   insulin detemir U-100 (LEVEMIR FLEXTOUCH U-100 INSULN) 100 unit/mL (3 mL) injection pen Use up to 30 units per day, as per MD instructions       Medical History:  Past Medical History:   Diagnosis Date   ??? Anxiety    ??? Chronic pain disorder    ??? Cystic fibrosis (CMS-HCC)    ??? Depression    ??? Hypertension    ??? Nonproductive cough 04/05/2018       Surgical History:  Past Surgical History:   Procedure Laterality Date   ??? PR REMOVAL OF LUNG,LOBECTOMY Right 03/29/2017    Procedure: REMOVAL OF LUNG, OTHER THAN PNEUMONECTOMY; SINGLE LOBE (LOBECTOMY);  Surgeon: Cherie Dark, MD;  Location: MAIN OR [facility];  Service: Thoracic       Social History:  Social History     Socioeconomic History   ??? Marital status: Married     Spouse name: Not on file   Tobacco Use   ??? Smoking status: Never Smoker   ??? Smokeless tobacco: Never Used   Substance and Sexual Activity   ??? Alcohol use: Yes     Alcohol/week: 3.0 standard drinks     Types: 3 Glasses of wine per week     Comment: 3 bottles of wine 2 days ago   ??? Drug use: No   ??? Sexual activity: Not Currently       Family History:  Family History   Problem Relation Age of Onset   ??? Bipolar disorder Mother    ??? Depression Mother        Review of Systems:  10 systems reviewed and are negative unless otherwise mentioned in HPI    Labs/Studies:  Labs and Studies from the last 24hrs per EMR and Reviewed    Physical Exam:  Temp:  [36.6 ??C-36.8 ??C] 36.8 ??C  Heart Rate:  [94-112] 94  Resp:  [17-20] 17  BP: (130-144)/(76-91) 130/76  SpO2:  [95 %-96 %] 95 %    General: sitting in bed, NAD  HEENT: , AT, PERRL, EOMI, oropharynx clear, enlarged tonsils  Cardiac: normal S1, S2, regular and borderline tachycardic, no murmurs  Pulmonary: normal WOB on RA, CTAB, no rhonchi  Chest: port site clean, dry, intact  Abdomen: soft, non-tender, non-distended  Skin: no rashes or wounds  Extremities: no LE edema  Neuro: A&O x3, CN II - XII intact, no gross focal deficits

## 2018-08-13 NOTE — Unmapped (Signed)
Patient rounding complete, call bell in reach, bed locked and in lowest position, patient belongings at bedside and within reach of patient.  Patient updated on plan of care. Waiting on bed.

## 2018-08-13 NOTE — Unmapped (Signed)
[facility]  Emergency Department Provider Note  ED Clinical Impression     Final diagnoses:   Cystic fibrosis exacerbation (CMS-HCC) (Primary)       Initial Impression, ED Course, Assessment and Plan     33 year old male with cystic fibrosis presenting to the emergency department for evaluation of cough, purulent sputum production and increasing chest pain after coughing.  On exam he is in no acute distress.  He states this is similar to prior CF exacerbations.  The patient is normotensive, satting 95% on room air.  He has no fevers here.  Plan to get a chest x-ray, labs and speak with the pulmonology team regarding admission.    ____________________________________________    Time seen: August 13, 2018 1:29 AM    I independently visualized the radiology images.   I reviewed the patient's prior medical records.    I have reviewed the triage vital signs and the nursing notes.  I have discussed the case with the ED Attending, Dr. Danella Sensing.     Labs and radiology results that were available during my care of the patient were independently reviewed by me and considered in my medical decision making.    Portions of this record have been created using Scientist, clinical (histocompatibility and immunogenetics). Dictation errors have been sought, but may not have been identified and corrected.    History     Chief Complaint  Shortness of Breath      HPI   Christian Young is a 33 y.o. male with a past medical history of chronic pain, cystic fibrosis presenting to the emergency department for evaluation of chest pain, cough, congestion and purulent sputum production.  Patient states that this is been ongoing for the past few days.  He describes generalized malaise as well.  Bilateral chest discomfort likely from coughing.  He describes it as aching in nature worse when he coughs.  He states this feels similar to prior CF exacerbations.      Past Medical History:   Diagnosis Date   ??? Anxiety    ??? Chronic pain disorder    ??? Cystic fibrosis (CMS-HCC)    ??? Depression    ??? Hypertension    ??? Nonproductive cough 04/05/2018       Past Surgical History:   Procedure Laterality Date   ??? PR REMOVAL OF LUNG,LOBECTOMY Right 03/29/2017    Procedure: REMOVAL OF LUNG, OTHER THAN PNEUMONECTOMY; SINGLE LOBE (LOBECTOMY);  Surgeon: Cherie Dark, MD;  Location: MAIN OR [facility];  Service: Thoracic          Current Facility-Administered Medications:   ???  acetaminophen (TYLENOL) tablet 650 mg, 650 mg, Oral, Q6H PRN **OR** ibuprofen (MOTRIN) tablet 600 mg, 600 mg, Oral, Q6H PRN **OR** oxyCODONE (ROXICODONE) immediate release tablet 5 mg, 5 mg, Oral, Q6H PRN, Lorenda Peck, MD  ???  albuterol 2.5 mg /3 mL (0.083 %) nebulizer solution 2.5 mg, 2.5 mg, Nebulization, 4x Daily (RT) **AND** ipratropium (ATROVENT) 0.02 % nebulizer solution 500 mcg, 500 mcg, Nebulization, 4x Daily (RT), Lorenda Peck, MD  ???  budesonide-formoterol (SYMBICORT) 160-4.5 mcg/actuation inhaler 2 puff, 2 puff, Inhalation, BID, Lorenda Peck, MD, 2 puff at 08/13/18 0126  ???  busPIRone (BUSPAR) tablet 30 mg, 30 mg, Oral, BID, Lorenda Peck, MD, 30 mg at 08/13/18 0127  ???  carvedilol (COREG) tablet 3.125 mg, 3.125 mg, Oral, BID, Lorenda Peck, MD, 3.125 mg at 08/13/18 0127  ???  cetirizine (ZyrTEC) tablet 10 mg, 10 mg,  Oral, Daily, Lorenda Peck, MD  ???  dextrose (D10W) 10% bolus 125 mL, 12.5 g, Intravenous, Q30 Min PRN, Lorenda Peck, MD  ???  dornase alfa (PULMOZYME) 1 mg/mL nebulizer solution 2.5 mg, 2.5 mg, Inhalation, BID, Lorenda Peck, MD, 2.5 mg at 08/13/18 0128  ???  gabapentin (NEURONTIN) capsule 400 mg, 400 mg, Oral, TID, Lorenda Peck, MD  ???  insulin glargine (LANTUS) injection 70 Units, 70 Units, Subcutaneous, Nightly, Lorenda Peck, MD  ???  insulin lispro (HumaLOG) injection 0-12 Units, 0-12 Units, Subcutaneous, ACHS, Lorenda Peck, MD  ???  insulin lispro (HumaLOG) injection 30 Units, 30 Units, Subcutaneous, TID AC, Lorenda Peck, MD  ???  lamoTRIgine (LaMICtal) tablet 100 mg, 100 mg, Oral, Daily, Lorenda Peck, MD  ???  lipase-protease-amylase (CREON) 36,000-114,000- 180,000 unit capsule 288,000 units of lipase, 8 capsule, Oral, 3xd Meals, Lorenda Peck, MD  ???  lisinopril (PRINIVIL,ZESTRIL) tablet 10 mg, 10 mg, Oral, Daily, Lorenda Peck, MD  ???  melatonin tablet 9 mg, 9 mg, Oral, Nightly, Lorenda Peck, MD, 9 mg at 08/13/18 0127  ???  montelukast (SINGULAIR) tablet 10 mg, 10 mg, Oral, Nightly, Lorenda Peck, MD, 10 mg at 08/13/18 0129  ???  MVW Complete (pediatric multivit 61-D3-vit K) 1,500-800 unit-mcg 2 capsule, 2 capsule, Oral, Daily, Lorenda Peck, MD  ???  non formulary, 1 tablet, Oral, BID, Lorenda Peck, MD  ???  pancrelipase (Lip-Prot-Amyl) (CREON) 24,000-76,000 -120,000 unit delayed release capsule 96,000 units of lipase, 4 capsule, Oral, With snacks, Lorenda Peck, MD  ???  pantoprazole (PROTONIX) EC tablet 40 mg, 40 mg, Oral, Daily, Lorenda Peck, MD  ???  piperacillin-tazobactam (ZOSYN) IVPB (premix) 4.5 g, 4.5 g, Intravenous, Q6H, Lorenda Peck, MD, Last Rate: 200 mL/hr at 08/13/18 0123, 4.5 g at 08/13/18 0123  ???  pramipexole (MIRAPEX) tablet 0.25 mg, 0.25 mg, Oral, Daily, Lorenda Peck, MD  ???  rivaroxaban (XARELTO) tablet 20 mg, 20 mg, Oral, Daily, Lorenda Peck, MD  ???  sodium chloride 7% nebulizer solution 4 mL, 4 mL, Nebulization, 4x Daily (RT), Lorenda Peck, MD  ???  tobramycin (NEBCIN) 230 mg in sodium chloride (NS) 0.9 % 100 mL IVPB, 230 mg, Intravenous, Q12H, Last Rate: 231.5 mL/hr at 08/13/18 0126, 230 mg at 08/13/18 0126 **AND** Inpatient consult to Pharmacy RX to dose: tobramycin, , , Once, Lorenda Peck, MD  ???  traZODone (DESYREL) tablet 100 mg, 100 mg, Oral, Nightly PRN, Lorenda Peck, MD, 100 mg at 08/13/18 0126  ???  vilazodone 40 mg tablet 40 mg, 40 mg, Oral, Daily, Lorenda Peck, MD    Current Outpatient Medications:   ???  albuterol (PROVENTIL HFA;VENTOLIN HFA) 90 mcg/actuation inhaler, Inhale 2 puffs every six (6) hours as needed., Disp: 1 Inhaler, Rfl: 1  ???  albuterol 2.5 mg/0.5 mL nebulizer solution, Inhale 0.5 mL (2.5 mg total) by nebulization every six (6) hours as needed for wheezing., Disp: 90 each, Rfl: 3  ???  blood sugar diagnostic Strp, Dispense 150 blood glucose test strips, ok to sub any brand preferred by insurance/patient, use up to 5x/day, dx E 08.9, Disp: 450 strip, Rfl: 12  ???  blood-glucose meter Misc, Dispense meter that is preferred by [employer], Disp: 1 each, Rfl: 0  ???  blood-glucose meter,continuous (DEXCOM G6 RECEIVER) Misc, Use as directed, Disp: 1 each, Rfl: 0  ???  blood-glucose sensor (DEXCOM G6 SENSOR) Devi, Use sq as  directed every 14 days, Disp: 6 Device, Rfl: 11  ???  blood-glucose transmitter (DEXCOM G6 TRANSMITTER) Devi, Use as directed, Disp: 1 Device, Rfl: 11  ???  budesonide-formoterol (SYMBICORT) 160-4.5 mcg/actuation inhaler, Inhale 2 puffs Two (2) times a day., Disp: 20 g, Rfl: 11  ???  busPIRone (BUSPAR) 30 MG tablet, Take 1 tablet (30 mg total) by mouth Two (2) times a day., Disp: 60 tablet, Rfl: 6  ???  carvedilol (COREG) 3.125 MG tablet, Take 1 tablet (3.125 mg total) by mouth Two (2) times a day., Disp: 180 tablet, Rfl: 3  ???  cetirizine (ZYRTEC) 10 MG tablet, TAKE 1 TABLET (10 MG TOTAL) BY MOUTH DAILY., Disp: 90 tablet, Rfl: 3  ???  diclofenac sodium (VOLTAREN) 1 % gel, Apply 2 g topically Four (4) times a day., Disp: 100 g, Rfl: 2  ???  dornase alfa (PULMOZYME) 1 mg/mL nebulizer solution, Inhale 2.5 mg Two (2) times a day., Disp: 450 mL, Rfl: 3  ???  gabapentin enacarbil (HORIZANT) 600 mg TbER, Take 600 mg by mouth daily with evening meal. Two tablets (total 1,200 mg) at night., Disp: , Rfl:   ???  glucagon, human recombinant, (GLUCAGON) 1 mg/ml injection, Inject 1 mL (1 mg total) under the skin once as needed (severe hypoglycemia). Follow package directions for low blood sugar., Disp: 1 mL, Rfl: 11  ???  HYDROcodone-acetaminophen (NORCO) 5-325 mg per tablet, Take 1 tablet by mouth every six (6) hours as needed for pain., Disp: , Rfl:   ???  insulin ASPART (NOVOLOG FLEXPEN) 100 unit/mL (3 mL) injection pen, Inject  30 units prior to each meal three times per day or as per MD instructions, Disp: 30 mL, Rfl: 12  ???  insulin glargine (BASAGLAR, LANTUS) 100 unit/mL (3 mL) injection pen, Inject 0.6 mL (60 Units total) under the skin daily., Disp: 30 mL, Rfl: 12  ???  lamoTRIgine (LAMICTAL) 100 MG tablet, Take 100 mg by mouth daily., Disp: , Rfl:   ???  lancets Misc, Dispense 150 lancets, ok to sub any brand preferred by insurance/patient, use up to 5x/day, dx E08.9, Disp: 150 each, Rfl: 12  ???  lipase-protease-amylase (CREON) 36,000-114,000- 180,000 unit CpDR, TAKE 7-8 CAPSULES BY MOUTH WITH MEALS AND 5-6 CAPSULES WITH SNACKS, Disp: 1800 capsule, Rfl: 3  ???  lisinopril (PRINIVIL,ZESTRIL) 10 MG tablet, TAKE 1 TABLET BY MOUTH EVERY DAY, Disp: 90 tablet, Rfl: 1  ???  melatonin 10 mg Tab, Take 10 mg by mouth nightly., Disp: , Rfl:   ???  montelukast (SINGULAIR) 10 mg tablet, Take 1 tablet (10 mg total) by mouth nightly., Disp: 90 tablet, Rfl: 3  ???  MVW COMPLETE FORMUL PROBIOTIC 40 billion cell -15 mg CpDR, Take 1 capsule by mouth daily., Disp: 90 capsule, Rfl: 3  ???  MVW COMPLETE FORMULATION D5000 5,000-800 unit-mcg cap, Take 2 capsules by mouth daily., Disp: 180 capsule, Rfl: 3  ???  nebulizers Misc, Use as directed with inhaled medications, Disp: 4 each, Rfl: 3  ???  omeprazole (PRILOSEC) 20 MG capsule, Take 1 capsule (20 mg total) by mouth daily., Disp: 90 capsule, Rfl: 3  ???  pen needle, diabetic (BD ULTRA-FINE NANO PEN NEEDLE) 32 gauge x 5/32 Ndle, ok to sub any brand or size needle preferred by insurance/patient, use up to 4x/day, dx E 08.9, Disp: 450 each, Rfl: 12  ???  pramipexole (MIRAPEX) 0.125 MG tablet, Take 0.25 mg by mouth daily. , Disp: , Rfl:   ???  rivaroxaban (XARELTO) 20 mg tablet,  Take 1 tablet (20 mg total) by mouth daily with evening meal., Disp: 90 tablet, Rfl: 3  ???  sodium chloride 7% 7 % Nebu, Inhale 4 mL by nebulization Two (2) times a day., Disp: 720 mL, Rfl: 3 ???  tezacaftor 100mg /ivacaftor 150mg  and ivacaftor 150mg  (SYMDEKO) tablets, TAKE BY MOUTH AS DIRECTED ON PACKAGE LABELING, Disp: 56 each, Rfl: 3  ???  traZODone (DESYREL) 100 MG tablet, TAKE 1 TABLET BY MOUTH NIGHTLY, Disp: 90 tablet, Rfl: 3  ???  vilazodone (VIIBRYD) 40 mg Tab, Take 1 tablet (40 mg total) by mouth daily., Disp: 90 tablet, Rfl: 0    Allergies  C[place] [aztreonam lysine]; Cefepime; Other; Slo-bid 100; Banana; and Tobramycin    Family History   Problem Relation Age of Onset   ??? Bipolar disorder Mother    ??? Depression Mother        Social History  Social History     Tobacco Use   ??? Smoking status: Never Smoker   ??? Smokeless tobacco: Never Used   Substance Use Topics   ??? Alcohol use: Yes     Alcohol/week: 3.0 standard drinks     Types: 3 Glasses of wine per week     Comment: 3 bottles of wine 2 days ago   ??? Drug use: No       Review of Systems   Constitutional: Negative for fever.  Eyes: Negative for visual changes.  ENT: Negative for sore throat.  Cardiovascular: +for chest pain.  Respiratory: +for shortness of breath.  Gastrointestinal: Negative for abdominal pain, vomiting or diarrhea.  Genitourinary: Negative for dysuria.  Musculoskeletal: Negative for back pain.  Skin: Negative for rash.  Neurological: Negative for headaches, focal weakness or numbness.      Physical Exam     VITAL SIGNS:    ED Triage Vitals   Enc Vitals Group      BP 08/12/18 1821 144/91      Heart Rate 08/12/18 1819 112      SpO2 Pulse --       Resp 08/12/18 1819 20      Temp 08/12/18 1819 36.6 ??C (97.9 ??F)      Temp Source 08/12/18 1819 Oral      SpO2 08/12/18 1819 96 %      Weight --       Height --       Head Circumference --       Peak Flow --       Pain Score --       Pain Loc --       Pain Edu? --       Excl. in GC? --        General: No acute distress but does appear uncomfortable  Eyes: Conjunctivae are normal.  ENT       Head: Normocephalic and atraumatic.       Nose: No congestion.       Mouth/Throat: Mucous membranes are moist.       Neck: No stridor.  Hematological/Lymphatic/Immunilogical: No cervical lymphadenopathy.  Cardiovascular: Regular rate and rhythm, Normal S1 and S2. No murmurs appreciated.  Respiratory: Normal respiratory effort. Breath sounds are equal bilaterally. No wheezes or crackles.   Gastrointestinal: Soft and nontender.   Musculoskeletal: Nontender with normal range of motion in all extremities.       Right lower leg: No tenderness or edema.       Left lower leg: No tenderness or edema.  Neurologic: Normal speech  and language. No gross focal neurologic deficits are appreciated.  Skin: Skin is warm, dry and intact. No rash noted.  Psychiatric: Mood and affect are normal. Speech and behavior are normal.    Radiology/EKG     Xr Chest 2 Views    Result Date: 08/12/2018  EXAM: XR CHEST 2 VIEWS DATE: 08/12/2018 8:44 PM ACCESSION: [id number] UN DICTATED: 08/12/2018 8:44 PM INTERPRETATION LOCATION: [facility] CLINICAL INDICATION: 33 years old Male with CYSTIC FIBROSIS  COMPARISON: 06/22/2018 chest radiograph, chest CT 06/24/2018 TECHNIQUE: PA and Lateral Chest Radiographs. FINDINGS: Right chest Port-A-Cath tip in the right atrium. Redemonstrated right apical scarring due to prior upper lobe resection. No new focal consolidation. No pleural effusion or pneumothorax. Stable cardiomediastinal silhouette.     No acute airspace disease.    Procedures     None       Verlin Dike, MD  Resident  08/13/18 [phone]

## 2018-08-14 LAB — BASIC METABOLIC PANEL
ANION GAP: 12 mmol/L (ref 7–15)
BLOOD UREA NITROGEN: 20 mg/dL (ref 7–21)
BUN / CREAT RATIO: 21
CHLORIDE: 98 mmol/L (ref 98–107)
CO2: 24 mmol/L (ref 22.0–30.0)
CREATININE: 0.95 mg/dL (ref 0.70–1.30)
EGFR CKD-EPI AA MALE: >90 mL/min/{1.73_m2} (ref >=60)
EGFR CKD-EPI NON-AA MALE: >90 mL/min/{1.73_m2} (ref >=60)
GLUCOSE RANDOM: 159 mg/dL (ref 70–179)
POTASSIUM: 4.1 mmol/L (ref 3.5–5.0)
SODIUM: 134 mmol/L — ABNORMAL LOW (ref 135–145)

## 2018-08-14 LAB — CBC W/ AUTO DIFF
BASOPHILS ABSOLUTE COUNT: 0.1 10*9/L (ref 0.0–0.1)
BASOPHILS RELATIVE PERCENT: 1.6 %
EOSINOPHILS RELATIVE PERCENT: 5 %
HEMATOCRIT: 41 % (ref 41.0–53.0)
LARGE UNSTAINED CELLS: 1 % (ref 0–4)
LYMPHOCYTES ABSOLUTE COUNT: 0.6 10*9/L — ABNORMAL LOW (ref 1.5–5.0)
LYMPHOCYTES RELATIVE PERCENT: 10.2 %
MEAN CORPUSCULAR HEMOGLOBIN CONC: 32.2 g/dL (ref 31.0–37.0)
MEAN CORPUSCULAR HEMOGLOBIN: 26.2 pg (ref 26.0–34.0)
MEAN CORPUSCULAR VOLUME: 81.6 fL (ref 80.0–100.0)
MEAN PLATELET VOLUME: 7.7 fL (ref 7.0–10.0)
MONOCYTES RELATIVE PERCENT: 8.8 %
NEUTROPHILS ABSOLUTE COUNT: 4.1 10*9/L (ref 2.0–7.5)
NEUTROPHILS RELATIVE PERCENT: 73.3 %
PLATELET COUNT: 307 10*9/L (ref 150–440)
RED BLOOD CELL COUNT: 5.03 10*12/L (ref 4.50–5.90)
RED CELL DISTRIBUTION WIDTH: 15.9 % — ABNORMAL HIGH (ref 12.0–15.0)
WBC ADJUSTED: 5.6 10*9/L (ref 4.5–11.0)

## 2018-08-14 LAB — TOBRAMYCIN RANDOM
Tobramycin:MCnc:Pt:Ser/Plas:Qn:: 0.8
Tobramycin:MCnc:Pt:Ser/Plas:Qn:: 8.4

## 2018-08-14 LAB — MEAN CORPUSCULAR VOLUME: Lab: 81.6

## 2018-08-14 LAB — GLUCOSE RANDOM: Glucose:MCnc:Pt:Ser/Plas:Qn:: 159

## 2018-08-14 NOTE — Unmapped (Signed)
Patient rounds completed. The following patient needs were addressed:  Pain, Toileting, Positioning; SUPINE in bed, Personal Belongings, Plan of Care, Call Bell in Reach and Bed Position Low . Pt denies any further needs at this time, pt resting queitly with eyes closed

## 2018-08-14 NOTE — Unmapped (Signed)
Assumed care of pt at this time.  Pt is resting with eyes closed. Pt is in NAD at this time.  Patient rounding complete, call bell in reach, bed locked and in lowest position, patient belongings at bedside and within reach of patient.  Patient updated on plan of care.

## 2018-08-14 NOTE — Unmapped (Signed)
Patient rounding complete, call bell in reach, bed locked and in lowest position, patient belongings at bedside and within reach of patient.  Patient updated on plan of care.

## 2018-08-14 NOTE — Unmapped (Signed)
Patient rounds completed. The following patient needs were addressed:  Pain, Toileting, Positioning; SUPINE in bed, Personal Belongings, Plan of Care, Call Bell in Reach and Bed Position Low . Pt denies any further needs at this time. Pt resting quietly with eyes closed.

## 2018-08-14 NOTE — Unmapped (Signed)
Care Management  Initial Transition Planning Assessment              General  Care Manager assessed the patient by : In person interview with patient, Discussion with Clinical Care team, Medical record review  Orientation Level: Oriented X4  Who provides care at home?: N/A  Reason for referral: Discharge Planning    Contact/Decision Maker  Extended Emergency Contact Information  Primary Emergency Contact: Griffin,Alyson   [place]  Mobile Phone: [phone]  Relation: Spouse    Legal Next of Kin / Guardian / POA / Advance Directives       Advance Directive (Medical Treatment)  Does patient have an advance directive covering medical treatment?: Patient has advance directive covering medical treatment, copy not in chart.  Advance directive covering medical treatment not in Chart:: Copy requested from family  Information provided on advance directive:: No  Patient requests assistance:: No    Advance Directive (Mental Health Treatment)  Does patient have an advance directive covering mental health treatment?: Patient would not like information.    Patient Information  Lives with: Spouse/significant other, Parent    Type of Residence: Private residence    Type of Residence: Mailing Address:  [address]  [address]  [place] [address]  Contacts:    Patient Phone Number: [phone] (home)           Medical Provider(s): [facility]  Reason for Admission: Admitting Diagnosis:  Cystic fibrosis exacerbation (CMS-HCC) [E84.9]  Past Medical History:   has a past medical history of Anxiety, Chronic pain disorder, Cystic fibrosis (CMS-HCC), Depression, Hypertension, and Nonproductive cough (04/05/2018).  Past Surgical History:   has a past surgical history that includes pr removal of lung,lobectomy (Right, 03/29/2017).   Previous admit date: 06/22/2018    Primary Insurance- Payor: Medical sales representative / Plan: [employer] CT GEN CHOICE FUND OA PLUS / Product Type: *No Product type* /   Secondary Insurance ??? None  Prescription Coverage ???   Preferred Pharm[place] - [facility] [place] [address]    Transportation home: Private vehicle  Level of function prior to admission: Independent           Location/Detail: [address], [place]     Support Systems: Family Members, Spouse    Responsibilities/Dependents at home?: No    Home Care services in place prior to admission?: No          Outpatient/Community Resources in place prior to admission: Clinic  Agency detail (Name/Phone #): [facility]    Equipment Currently Used at Home: glucometer, respiratory supplies  Current HME Agency (Name/Phone #): Patient also has nebulizer and percussion vest    Currently receiving outpatient dialysis?: No       Financial Information       Need for financial assistance?: No       Social Determinants of Health  Social Determinants of Health were addressed in provider documentation.  Please refer to patient history.    Discharge Needs Assessment  Concerns to be Addressed: care coordination/care conferences    Clinical Risk Factors: Multiple Diagnoses (Chronic)    Barriers to taking medications: No    Prior overnight hospital stay or ED visit in last 90 days: Yes    Readmission Within the Last 30 Days: no previous admission in last 30 days         Anticipated Changes Related to Illness: none  Equipment Needed After Discharge: none    Discharge Facility/Level of Care Needs: other (see comments)(home with HI)    Readmission  Risk of Unplanned Readmission Score: UNPLANNED READMISSION SCORE: 19%  Predictive Model Details           19% (Meduim) Factors Contributing to Score   Calculated 08/14/2018 18:23 31% Number of active Rx orders is 64   [place] Risk of Unplanned Readmission Model 14% Active NSAID Rx order is present     14% Number of hospitalizations in last year is 4     12% Number of ED visits in last six months is 3     7% ECG/EKG order is present in last 6 months     5% Imaging order is present in last 6 months     4% Latest hemoglobin is low (13.2 g/dL)     4% Phosphorous result is present     Readmitted Within the Last 30 Days? (No if blank)        Discharge Plan  Screen findings are: Discharge planning needs identified or anticipated (Comment).    Expected Discharge Date: 08/18/18    Expected Transfer from Critical Care:      Patient and/or family were provided with choice of facilities / services that are available and appropriate to meet post hospital care needs?: Yes   List choices in order highest to lowest preferred, if applicable. : [facility] for [facility]    Initial Assessment complete?: Yes

## 2018-08-14 NOTE — Unmapped (Addendum)
Meal tray ordered for pt ETA 0700. Pt ambulated to the bathroom with no assistance.

## 2018-08-14 NOTE — Unmapped (Signed)
Pt admitted from ED this shift. VSS, no complaints of pain, afebrile. Pt is AxOx4. Port accessed in ED, C/D/I. IV antibiotics given per order. No acute events. Bed is low and locked. Pt is independent in all ADL's. No falls or injuries. WCM.     Problem: Adult Inpatient Plan of Care  Goal: Plan of Care Review  Outcome: Ongoing - Unchanged  Goal: Patient-Specific Goal (Individualization)  Outcome: Ongoing - Unchanged  Goal: Absence of Hospital-Acquired Illness or Injury  Outcome: Ongoing - Unchanged  Goal: Optimal Comfort and Wellbeing  Outcome: Ongoing - Unchanged  Goal: Readiness for Transition of Care  Outcome: Ongoing - Unchanged  Goal: Rounds/Family Conference  Outcome: Ongoing - Unchanged     Problem: Infection  Goal: Infection Symptom Resolution  Outcome: Ongoing - Unchanged     Problem: Adjustment to Illness (Cystic Fibrosis)  Goal: Optimal Coping  Outcome: Ongoing - Unchanged     Problem: Infection (Cystic Fibrosis)  Goal: Absence of Infection Signs/Symptoms  Outcome: Ongoing - Unchanged     Problem: Malabsorption (Cystic Fibrosis)  Goal: Optimal Bowel Elimination  Outcome: Ongoing - Unchanged     Problem: Oral Intake Inadequate (Cystic Fibrosis)  Goal: Optimal Nutrition Intake  Outcome: Ongoing - Unchanged     Problem: Respiratory Compromise (Cystic Fibrosis)  Goal: Effective Oxygenation and Ventilation  Outcome: Ongoing - Unchanged

## 2018-08-14 NOTE — Unmapped (Signed)
Endocrinology Consult Note    Requesting Attending Physician :  Christian Cove, MD  Service Requesting Consult : Pulmonology (MDG)  Primary Care Provider: [facility]  Outpatient Endocrinologist: Dr. Monia Young    Assessment/Recommendations:    Principal Problem:    Cystic fibrosis with pulmonary exacerbation (CMS-HCC)  Active Problems:    Essential hypertension    Depressive disorder    Anxiety    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)    Pancreatic insufficiency due to cystic fibrosis (CMS-HCC)    History of Mycobacterium abscessus infection    Bronchiectasis (CMS-HCC)    Chronic deep vein thrombosis (DVT) of lower extremity (CMS-HCC)    Cystic fibrosis (CMS-HCC)    Obesity (BMI 30-39.9)    Restless leg syndrome    [name] is a 33 y.o. male with a h/o CF, CFRD, admitted for CFE, who is seen in consultation at the request of Christian Cove, MD for evaluation of CFRD    CFRD- well controlled this hospitalization. I would continue the current regimen, adding a snack dose. Will continue to follow.    - continue Lantus 70U QHS  - continue Lispro 30U TID AC main meals. (hold if NPO)  - add lispro snack dose PRN 8U  - continue lispro sliding scale insulin.    Patient was seen and discussed with attending Dr. Marcello Young  Recommendations were discussed with primary team.    Christian Galea, MD  PGY4 Endocrinology Fellow    I saw and evaluated the patient, participating in the key portions of the service.  I reviewed the resident???s note.  I agree with the resident???s findings and plan.     Christian Edu, MD, MPH  Attending - Endocrinology and Metabolism      Please page Endocrine consult pager if questions:  [phone]  ---------------------------------    History of Present Illness: :  Reason for Consult:  CFRD    [name] is a 33 y.o. male with a h/o CF, CFRD, admitted for [facility], who is seen in consultation at the request of Christian Cove, MD for evaluation of CFRD    Patient is admitted for cf exacerbation and getting IV antibiotics. No steroids.     Follows with Dr. Monia Young in clinic. Home doses are 60U Lantus QHS, Lispro 30U AC, 8U snack, resistant correction.   Lab Results   Component Value Date    A1C 8.5 (H) 08/12/2018     In hospital he has been on Lantus 70U QHS, Lispro 30U AC, standard sliding scale. Glucose is well controlled.    He tells me he usually is taking 70U Lantus and 32U lispro with meals. He has around 8U with snacks. He has very rare hypos, to the 50s.     ROS:  See HPI. Ten systems reviewed and otherwise negative.    Allergies:  Cayston [aztreonam lysine]; Cefepime; Other; Slo-bid 100; Banana; and Tobramycin    All Medications:   Current Facility-Administered Medications   Medication Dose Route Frequency Provider Last Rate Last Dose   ??? acetaminophen (TYLENOL) tablet 650 mg  650 mg Oral Q6H PRN Christian Peck, MD        Or   ??? ibuprofen (MOTRIN) tablet 600 mg  600 mg Oral Q6H PRN Christian Peck, MD        Or   ??? oxyCODONE (ROXICODONE) immediate release tablet 5 mg  5 mg Oral Q6H PRN Christian Peck, MD  5 mg at 08/13/18 2007   ??? albuterol 2.5 mg /3 mL (0.083 %) nebulizer solution 2.5 mg  2.5 mg Nebulization 4x Daily (RT) Christian Peck, MD   2.5 mg at 08/14/18 1203   ??? budesonide-formoterol (SYMBICORT) 160-4.5 mcg/actuation inhaler 2 puff  2 puff Inhalation BID Christian Peck, MD   2 puff at 08/14/18 0834   ??? busPIRone (BUSPAR) tablet 30 mg  30 mg Oral BID Christian Peck, MD   30 mg at 08/14/18 0945   ??? carvedilol (COREG) tablet 3.125 mg  3.125 mg Oral BID Christian Peck, MD   3.125 mg at 08/14/18 1610   ??? cetirizine (ZyrTEC) tablet 10 mg  10 mg Oral Daily Christian Peck, MD   10 mg at 08/14/18 0831   ??? dextrose (D10W) 10% bolus 125 mL  12.5 g Intravenous Q30 Min PRN Christian Peck, MD       ??? dornase alfa (PULMOZYME) 1 mg/mL nebulizer solution 2.5 mg  2.5 mg Inhalation BID Christian Peck, MD   2.5 mg at 08/14/18 9604   ??? gabapentin (NEURONTIN) capsule 400 mg  400 mg Oral TID Christian Peck, MD   400 mg at 08/14/18 0831   ??? insulin glargine (LANTUS) injection 70 Units  70 Units Subcutaneous Nightly Christian Peck, MD   70 Units at 08/13/18 2143   ??? insulin lispro (HumaLOG) injection 0-12 Units  0-12 Units Subcutaneous ACHS Christian Peck, MD   2 Units at 08/13/18 1825   ??? insulin lispro (HumaLOG) injection 30 Units  30 Units Subcutaneous TID [facility] Christian Peck, MD   30 Units at 08/14/18 [phone]   ??? lamoTRIgine (LaMICtal) tablet 100 mg  100 mg Oral Daily Christian Peck, MD   100 mg at 08/14/18 8119   ??? lisinopril (PRINIVIL,ZESTRIL) tablet 10 mg  10 mg Oral Daily Christian Peck, MD   10 mg at 08/14/18 0831   ??? melatonin tablet 9 mg  9 mg Oral Nightly Christian Peck, MD   9 mg at 08/13/18 2141   ??? montelukast (SINGULAIR) tablet 10 mg  10 mg Oral Nightly Christian Peck, MD   10 mg at 08/13/18 2143   ??? MVW Complete (pediatric multivit 61-D3-vit K) 1,500-800 unit-mcg 2 capsule  2 capsule Oral Daily Christian Peck, MD   2 capsule at 08/14/18 1478   ??? ondansetron (ZOFRAN-ODT) disintegrating tablet 4 mg  4 mg Oral Q12H PRN Christian Arnt, MD       ??? pancrelipase (Lip-Prot-Amyl) (CREON) 24,000-76,000 -120,000 unit delayed release capsule 288,000 units of lipase  12 capsule Oral 3xd Meals Christian Peck, MD   288,000 units of lipase at 08/14/18 0830   ??? pancrelipase (Lip-Prot-Amyl) (CREON) 24,000-76,000 -120,000 unit delayed release capsule 96,000 units of lipase  4 capsule Oral With snacks Christian Peck, MD       ??? pantoprazole (PROTONIX) EC tablet 40 mg  40 mg Oral Daily Christian Peck, MD   40 mg at 08/14/18 2956   ??? piperacillin-tazobactam (ZOSYN) IVPB (premix) 4.5 g  4.5 g Intravenous Q6H Christian Peck, MD   Stopped at 08/14/18 [phone]   ??? pramipexole (MIRAPEX) tablet 0.25 mg  0.25 mg Oral Daily Christian Peck, MD   0.25 mg at 08/14/18 8657   ??? rivaroxaban (XARELTO) tablet 20 mg  20 mg Oral Daily Christian Peck, MD   20 mg at 08/13/18 1809   ??? sodium chloride  7% nebulizer solution 4 mL  4 mL Nebulization 4x Daily (RT) Christian Peck, MD   4 mL at 08/14/18 1203   ??? tezacaftor 100mg /ivacaftor 150mg  and ivacaftor 150mg  (SYMDEKO) tablets * Patient supplied *  1 tablet Oral BID Christian Peck, MD   1 tablet at 08/14/18 0526   ??? tobramycin (NEBCIN) 230 mg in sodium chloride (NS) 0.9 % 100 mL IVPB  230 mg Intravenous Q12H Christian Peck, MD   Stopped at 08/14/18 0037   ??? traZODone (DESYREL) tablet 100 mg  100 mg Oral Nightly PRN Christian Peck, MD   100 mg at 08/13/18 0126       Past Medical History:    Medical History:  Past Medical History:   Diagnosis Date   ??? Anxiety    ??? Chronic pain disorder    ??? Cystic fibrosis (CMS-HCC)    ??? Depression    ??? Hypertension    ??? Nonproductive cough 04/05/2018       Surgical History:  Past Surgical History:   Procedure Laterality Date   ??? PR REMOVAL OF LUNG,LOBECTOMY Right 03/29/2017    Procedure: REMOVAL OF LUNG, OTHER THAN PNEUMONECTOMY; SINGLE LOBE (LOBECTOMY);  Surgeon: Cherie Dark, MD;  Location: MAIN OR [facility];  Service: Thoracic       Social History:  Social History     Socioeconomic History   ??? Marital status: Single     Spouse name: Not on file   ??? Number of children: Not on file   ??? Years of education: Not on file   ??? Highest education level: Not on file   Occupational History   ??? Not on file   Social Needs   ??? Financial resource strain: Not hard at all   ??? Food insecurity:     Worry: Patient refused     Inability: Patient refused   ??? Transportation needs:     Medical: No     Non-medical: No   Tobacco Use   ??? Smoking status: Never Smoker   ??? Smokeless tobacco: Never Used   Substance and Sexual Activity   ??? Alcohol use: Yes     Alcohol/week: 3.0 standard drinks     Types: 3 Glasses of wine per week     Comment: 3 bottles of wine 2 days ago   ??? Drug use: No   ??? Sexual activity: Not Currently   Lifestyle   ??? Physical activity:     Days per week: 0 days     Minutes per session: 0 min   ??? Stress: Only a little Relationships   ??? Social connections:     Talks on phone: Patient refused     Gets together: Patient refused     Attends religious service: Patient refused     Active member of club or organization: Patient refused     Attends meetings of clubs or organizations: Patient refused     Relationship status: Patient refused   Other Topics Concern   ??? Not on file   Social History Narrative   ??? Not on file       Family History:  Family History   Problem Relation Age of Onset   ??? Bipolar disorder Mother    ??? Depression Mother          Objective: :    Patient Vitals for the past 8 hrs:   BP Temp Temp src Pulse Resp SpO2 Height Weight   08/14/18 1032 ??? ??? ??? ??? ??? ???  182.9 cm (6') ???   08/14/18 1001 ??? ??? ??? ??? ??? ??? ??? (!) 120.7 kg (266 lb 3.2 oz)   08/14/18 0911 137/85 36.3 ??C Oral 101 18 95 % ??? (!) 120.7 kg (266 lb 3.2 oz)   08/14/18 0806 128/66 37 ??C Oral 92 18 98 % ??? ???       I/O this shift:  In: 240 [P.O.:240]  Out: -     Physical Exam:     General: Alert, well-appearing, and in no distress.  Eyes: Anicteric sclera, conjunctiva clear.  ENT:  MMM  Lungs: Normal excursion. Good air movement bilaterally. No wheezes or crackles.  Cardiovascular: Regular rate and rhythm, S1, S2 normal, no murmur, click, rub or gallop appreciated.  Abdomen: Soft, non-tender, not distended  Musculoskeletal: No clubbing and no synovitis.  Ext: none edema  Skin: No rashes or lesions.  Neuro: The patient was alert and appropriate for conversation. Spontaneous speech was fluent without word finding pauses, dysarthria, or paraphasic errors. Comprehension was intact. Face symmetric at rest and with spontaneous activation. No facial masking. Hearing intact to conversation. Normal bulk. No tremors, myoclonus, or other adventitious movement. Moving all extremities equally and spontaneously. Grossly smooth movement of all extremities.         Test Results      CBC - Results in Past 2 Days  Result Component Current Result   WBC 5.6 (08/14/2018)   RBC 5.03 (08/14/2018) HCT 41.0 (08/14/2018)   HGB 13.2 (L) (08/14/2018)   Platelet 307 (08/14/2018)   RDW 15.9 (H) (08/14/2018)   MCV 81.6 (08/14/2018)   MCH 26.2 (08/14/2018)   MCHC 32.2 (08/14/2018)   MPV 7.7 (08/14/2018)     BMP - Results in Past 2 Days  Result Component Current Result   Sodium 134 (L) (08/14/2018)   Potassium 4.1 (08/14/2018)   Chloride 98 (08/14/2018)   CO2 24.0 (08/14/2018)   BUN 20 (08/14/2018)   Creatinine 0.95 (08/14/2018)   Glucose 159 (08/14/2018)       Thyroid -  Lab Results   Component Value Date    TSH 2.120 11/18/2016

## 2018-08-14 NOTE — Unmapped (Addendum)
Cystic Fibrosis Nutrition Assessment    Inpatient: Nutrition Risk Screening this admission for CF and related follow up  Primary Pulmonologist: Dr. [name]  ===================================================================  Christian Young is a 33 y.o. male seen for medical nutrition therapy. Currently admitted with CF exacerbation for IV antibiotics.  ===================================================================  INTERVENTION    1. Change snack dose of Creon 24,000 to 6 capsules per snack  - Continue 12 capsules per meal   - At discharge, resume home regimen of Creon 36,000 (7-8 w/ meals, 4 w/ snacks)    2. Recommend adjustment of vitamin regimen:  - Continue CF vitamin:  MVW Complete Formulation gel cap 2 daily   - START 7,000 International units vitamin D3/cholecalciferol daily  - This will provide a total of 16109 International units vitamin D daily.    - At discharge, resume home regimen of MVW-D5000 Complete Formulation, increasing to 2 gel caps daily (total of 10,000 International units vitamin D daily) and no extra Vitamin D3/cholecalciferol necessary.    3. Weigh patient twice weekly this admit.    4. Check DCP (DesCarboxyPT)  to assess Vitamin K status    5. If pt to remain on oxycodone, would benefit from bowel regimen - miralax 1 cap to prevent constipation    6. Banana allergy confirmed in food service software (computrition) and with patient    7. Discouraged patient from using probiotics at home while central line accessed for IV antibiotics    8. Provided resource sheets for carbohydrate counting of inpatient meals  - patient reports working towards goal of consistently consuming 70 grams carb per meal    9. Continue remainder of nutrition regimen:  - acid reducer  - high calorie high protein diet   - insulin per endocrine    Inpatient:   Will follow up with patient per protocol: 1-2 times per week (and more frequent as indicated)===================================================================  ASSESSMENT:  Cystic Fibrosis Nutrition Category = Outstanding    Current diet is appropriate for CF. Current PO intake is adequate to meet estimated CF needs. Patient would benefit from adjustment in enzyme regimen. Vitamin prescription is not appropriate to reach/maintain optimal fat soluble vitamin levels. Patient would benefit from change in vitamin regimen. Sodium needs for CF met with PO and/or supplement. Patient to benefit from adjustment/initiation of bowel regimen. Acid reducer appropriate for GERD and enzyme activation.    Malnutrition Assessment using AND/ASPEN Clinical Characteristics:  Patient does not meet AND/ASPEN criteria for malnutrition at this time (08/14/18 1306)    Goals:  1. Meet estimated daily needs: 3970 kcals (per CF conference formula less 500); 145-193 gm pro (DRI x 1.5 -2); 3500 mL free water (Holliday Segar Method)  2. Reach/maintain established goals for CF:                Adult - BMI 22kg/m2 for CF females and 23kg/m2 for CF males  3. Normal fat-soluble vitamin levels: Vitamin A, Vitamin E and PT per lab range; Vitamin D 25OH total >30  4. Maintain glucose control. Carbohydrate content of diet should comprise 40-50% of total calorie needs, but carbohydrates are not restricted in this population.    5.  Meet sodium needs for CF  ===================================================================  INPATIENT:      Current Nutrition Orders (inpatient):       Nutrition Orders   (From admission, onward)             Start     Ordered    08/13/18 0013  Nutrition Therapy  High Calorie High Protein  Effective now     Question:  Nutrition Therapy (T):  Answer:  High Calorie High Protein    08/13/18 0012              CF Nutrition related medications (inpatient): Nutritionally relevant medications reviewed.     Lantus 70 units nightly  HumaLOG (sliding scale, 30 units w/ meals)  MVW-regular complete x 2 capsules  Creon 24,000 x 12 with meals, 4 with snacks (sub for home Creon 36000)  Protonix  Symdeko  Xarelto (anticoagulant)     CF Nutrition related labs (inpatient):  Recent Labs   Lab Units 08/14/18  0812 08/13/18  2045 08/13/18  1748   POC GLUCOSE mg/dL [phone]       Last 5 Recorded Weights    08/14/18 0911 08/14/18 1001   Weight: (!) 120.7 kg (266 lb 3.2 oz) (!) 120.7 kg (266 lb 3.2 oz)   ==================================================================  CLINICAL DATA:  Past Medical History:   Diagnosis Date   ??? Anxiety    ??? Chronic pain disorder    ??? Cystic fibrosis (CMS-HCC)    ??? Depression    ??? Hypertension    ??? Nonproductive cough 04/05/2018   - s/p RUL lobectomy 03-29-17  -  CFRD, PI, essential HTN  - allergy to bananas    Anthroprometric Evaluation:  Weight changes: -3 lbs (1.1%) x 3 months; not significant    BMI Readings from Last 1 Encounters:   08/14/18 36.10 kg/m??     Wt Readings from Last 12 Encounters:   08/14/18 (!) 120.7 kg (266 lb 3.2 oz)   06/19/18 (!) 121.6 kg (268 lb)   06/19/18 (!) 122.6 kg (270 lb 4.8 oz)   04/09/18 (!) 120 kg (264 lb 8.8 oz)   03/17/18 (!) 120.2 kg (265 lb)   12/29/17 (!) 119.2 kg (262 lb 12.6 oz)   10/23/17 (!) 123.9 kg (273 lb 3.2 oz)   10/08/17 (!) 124 kg (273 lb 4.8 oz)   09/11/17 (!) 128.4 kg (283 lb)   08/21/17 (!) 119.9 kg (264 lb 4.8 oz)   06/20/17 (!) 122.5 kg (270 lb)   06/04/17 (!) 120.6 kg (265 lb 14 oz)     Ht Readings from Last 3 Encounters:   08/14/18 182.9 cm (6')   06/19/18 183 cm (6' 0.05)   06/19/18 182.9 cm (6' 0.01)   ==================================================================  Energy Intake (outpatient):  Diet: High in calories, fat, salt.   Food allergies:  banana (itchy throat, nausea/vomiting)  Allergy documented in computrition.  Diet and CFTR modulators: Prescribed Symdeko (tezacaftor/ivacaftor).   Adequate fat consumed with dose. Further education not required.  PO Supplements: none  Appetite Stimulant: none  Enteral feeding tube: n/a  Sodium in diet: Adequate from diet  Calcium in diet:  Adequate from diet     Fat Malabsorption (outpatient):  Enzyme brand, (meals/snacks):  Creon 36,000 (7-8 at meals, 4 at snacks)  - Pt order is for 5-6 capsule w/ snack but he reports taking only 4 caps  - Switched from Zenpep to Creon due to report of bloating & greasy stools when on Zenpep.   Enzyme administration details: correct pre-meal administration., moderate compliance, swallows capsules whole  Enzyme dose per MEAL (units lipase/kg/meal) 2386  Enzyme dose per DAY (units lipase/kg/day) 10339  Stools: denies s/s of malabsorption/diarrhea/constipation  Abdominal pain: None reported  Fecal Fat Studies:    Pancreatic Elastase-1   Date Value Ref Range Status   03/19/2017 <  15 (L) mcg/g Final     Comment:     Adult and Pediatric Reference Ranges for    Pancreatic Elastase-1:                  Normal:      >200 mcg/g  Moderate Pancreatic        Insufficiency:   100-200 mcg/g    Severe Pancreatic        Insufficiency:      <100 mcg/g     Elastase-1 (E-1) assay results are expressed  in mcg/g, which represent mcg E1/g feces.     It is not necessary to interrupt enzyme  substitution therapy.     Test Performed by:  [facility]  [address]  [place], [place] [address]     GI meds: Nutritionally relevant medications reviewed. MVW probiotic(on hold during central line access), omeprazole.    Vitamins/Minerals (outpatient):  CF-specific MVI, dose, compliance: MVW Complete Formulation Softgel D-5000 1 daily per patient  -  prescription and last recs are for 2 daily to treat low vitamin D level <30  Other vitamins/minerals/herbals: none  Calcium supplement: none  Patient Resources: -[employer] (phone [phone] [email or web address])   - Orders CF vitamin and probiotic directly from manufacturer (MVW), cost billed directly to [employer] grant   - [employer] ID #[id number] CF Vitamins & Supplements active from 05/07/2018 to 05/07/2019 - He is thinks he may also be enrolled in [employer] but unclear  Fat-soluble vitamin levels:    - Low vit D treated with recs for MVW-D5000 gel cap 2 daily, but patient only taking 1 daily. Due for recheck but ideally would wait until compliant x 3 months or longer with higher dose.    - High PT likely from anticoag meds, use DCP  Lab Results   Component Value Date/Time    VITAMINA 44.4 11/19/2016 0647     Lab Results   Component Value Date/Time    VITDTOTAL 27.9 03/19/2017 0508        Lab Results   Component Value Date/Time    PT 14.2 (H) 08/13/2018 0848    PT 12.9 (H) 08/19/2017 1340    PT 11.1 03/29/2017 0508    PT 11.5 03/28/2017 0519    PT 14.1 (H) 11/29/2016 0807      Ref. Range 03/20/2017 05:20   Des-G Carboxy PT Latest Ref Range: <7.5 ng/mL 0.2   Normal DesCarboxyPT (also known as PIVKA) indicates no vitamin K deficiency. Use DCP in place of PT due to anticoag with xeralto for h/o DVT    Bone Health: Abnormal vitamin D level, being treated. Last DEXA normal.   - Last DEXA/QDR 03/04/2017 Spine Z score is 0.1 and the T score is 0.1. Lleft femur Z score is -0.1 and the T score is -0.1.  The femoral neck T score is -0.6. Lumbar spine: Normal bone density  Left proximal femur: Normal bone density.    CF Related Diabetes: Yes. Seen by outpatient [facility] Endocrinology 06-19-18 re: insulin therapy. Endocrine following this admission.  - He has goal of 70 grams carb per meal, learning to carb count, no insulin:carb ratio, uses pen for insulin, CGM  - Lispro 32 units/meal and 8 units/snack  - Prior to admission, reports glucoses running high        Lab Results   Component Value Date/Time    A1C 8.5 (H) 08/12/2018 2024

## 2018-08-14 NOTE — Unmapped (Signed)
Patient rounds completed. The following patient needs were addressed:  Pain, Toileting, Personal Belongings, Plan of Care, Call Bell in Reach and Bed Position Low .

## 2018-08-14 NOTE — Unmapped (Signed)
Adult Inpatient CF Initial Assessment        Christian Young is a 33 y.o. male who was admitted for Cystic fibrosis exacerbation (CMS-HCC) [E84.9].    Heart Rate:   Respiratory Rate:   Bilateral Breath Sounds:     Cough/Sputum: How Much? Coughing occasionally, Not productive                              Color:    Are they coughing up blood? Yes:                                                       No: X                                                     How Much?    Home medication & frequency :    Albuterol Neb 2.5mg  BID  Albuterol HFA 83mcg/2puffs PRN   Hypertonic Saline 7% BID  Pulmozyme 2.5mg  Nightly  Symbicort HFA BID    Home airway clearance & frequency:    Vest BID      Times Chosen for Inhaled Medications and Airway Clearance:  0900    Methods tried & found to be ineffective:   Vibralung    Home O2? Yes:                       No:X  If yes, what is their home requirement:     Home non-invasive ventilation? Yes:                                                          No:X  If yes, enter type of device, settings, & mode:    Airway clearance technique method offered & decided upon:   [place]    Comments:

## 2018-08-14 NOTE — Unmapped (Signed)
Patient rounding complete, call bell in reach, bed locked and in lowest position, patient belongings at bedside and within reach of patient. Patient did not like meal tray nurse order new tray.

## 2018-08-14 NOTE — Unmapped (Signed)
Patient rounds completed. The following patient needs were addressed:  Pain, Toileting, Positioning; SUPINE in bed, Personal Belongings, Plan of Care, Call Bell in Reach and Bed Position Low . Pt denies any further needs at this time.

## 2018-08-14 NOTE — Unmapped (Signed)
Patient rounding complete, call bell in reach, bed locked and in lowest position, patient belongings at bedside and within reach of patient. Patient provided breakfast tray.

## 2018-08-15 LAB — CBC W/ AUTO DIFF
BASOPHILS ABSOLUTE COUNT: 0.1 10*9/L (ref 0.0–0.1)
BASOPHILS RELATIVE PERCENT: 2 %
EOSINOPHILS ABSOLUTE COUNT: 0.4 10*9/L (ref 0.0–0.4)
EOSINOPHILS RELATIVE PERCENT: 7.1 %
HEMOGLOBIN: 12.4 g/dL — ABNORMAL LOW (ref 13.5–17.5)
LYMPHOCYTES ABSOLUTE COUNT: 1.1 10*9/L — ABNORMAL LOW (ref 1.5–5.0)
LYMPHOCYTES RELATIVE PERCENT: 20.7 %
MEAN CORPUSCULAR HEMOGLOBIN CONC: 31.6 g/dL (ref 31.0–37.0)
MEAN CORPUSCULAR HEMOGLOBIN: 26.3 pg (ref 26.0–34.0)
MEAN CORPUSCULAR VOLUME: 83.1 fL (ref 80.0–100.0)
MEAN PLATELET VOLUME: 9 fL (ref 7.0–10.0)
MONOCYTES ABSOLUTE COUNT: 0.7 10*9/L (ref 0.2–0.8)
MONOCYTES RELATIVE PERCENT: 13.6 %
NEUTROPHILS ABSOLUTE COUNT: 2.9 10*9/L (ref 2.0–7.5)
NEUTROPHILS RELATIVE PERCENT: 54.2 %
PLATELET COUNT: 284 10*9/L (ref 150–440)
RED BLOOD CELL COUNT: 4.71 10*12/L (ref 4.50–5.90)
RED CELL DISTRIBUTION WIDTH: 15.3 % — ABNORMAL HIGH (ref 12.0–15.0)
WBC ADJUSTED: 5.3 10*9/L (ref 4.5–11.0)

## 2018-08-15 LAB — CHLORIDE: Chloride:SCnc:Pt:Ser/Plas:Qn:: 100

## 2018-08-15 LAB — BASIC METABOLIC PANEL
ANION GAP: 11 mmol/L (ref 7–15)
BUN / CREAT RATIO: 18
CALCIUM: 8.5 mg/dL (ref 8.5–10.2)
CHLORIDE: 100 mmol/L (ref 98–107)
CO2: 25 mmol/L (ref 22.0–30.0)
CREATININE: 0.74 mg/dL (ref 0.70–1.30)
EGFR CKD-EPI NON-AA MALE: >90 mL/min/{1.73_m2} (ref >=60)
GLUCOSE RANDOM: 196 mg/dL — ABNORMAL HIGH (ref 70–179)
POTASSIUM: 4.2 mmol/L (ref 3.5–5.0)
SODIUM: 136 mmol/L (ref 135–145)

## 2018-08-15 LAB — MEAN PLATELET VOLUME: Lab: 9

## 2018-08-15 LAB — HEPATIC FUNCTION PANEL
ALKALINE PHOSPHATASE: 71 U/L (ref 38–126)
ALT (SGPT): 54 U/L — ABNORMAL HIGH (ref <50)
AST (SGOT): 32 U/L (ref 19–55)
BILIRUBIN DIRECT: <0.1 mg/dL (ref 0.00–0.40)
BILIRUBIN TOTAL: 0.3 mg/dL (ref 0.0–1.2)
PROTEIN TOTAL: 6.3 g/dL — ABNORMAL LOW (ref 6.5–8.3)

## 2018-08-15 LAB — C-REACTIVE PROTEIN: C reactive protein:MCnc:Pt:Ser/Plas:Qn:: 24.9 — ABNORMAL HIGH

## 2018-08-15 LAB — ALT (SGPT): Alanine aminotransferase:CCnc:Pt:Ser/Plas:Qn:: 54 — ABNORMAL HIGH

## 2018-08-15 NOTE — Unmapped (Signed)
VSS. BSGC elevated, treated with scheduled insulin coverage. Patient remains on RA. He remains independently ambulatory. He tolerated his IV and oral medication well without issue. He has not voiced any concerns or complaints at this time. Will continue to monitor patient and will continue POC.   Problem: Adult Inpatient Plan of Care  Goal: Plan of Care Review  Outcome: Ongoing - Unchanged  Goal: Patient-Specific Goal (Individualization)  Outcome: Ongoing - Unchanged  Goal: Absence of Hospital-Acquired Illness or Injury  Outcome: Ongoing - Unchanged  Goal: Optimal Comfort and Wellbeing  Outcome: Ongoing - Unchanged  Goal: Readiness for Transition of Care  Outcome: Ongoing - Unchanged  Goal: Rounds/Family Conference  Outcome: Ongoing - Unchanged     Problem: Infection  Goal: Infection Symptom Resolution  Outcome: Ongoing - Unchanged     Problem: Adjustment to Illness (Cystic Fibrosis)  Goal: Optimal Coping  Outcome: Ongoing - Unchanged     Problem: Infection (Cystic Fibrosis)  Goal: Absence of Infection Signs/Symptoms  Outcome: Ongoing - Unchanged     Problem: Malabsorption (Cystic Fibrosis)  Goal: Optimal Bowel Elimination  Outcome: Ongoing - Unchanged     Problem: Oral Intake Inadequate (Cystic Fibrosis)  Goal: Optimal Nutrition Intake  Outcome: Ongoing - Unchanged     Problem: Respiratory Compromise (Cystic Fibrosis)  Goal: Effective Oxygenation and Ventilation  Outcome: Ongoing - Unchanged

## 2018-08-15 NOTE — Unmapped (Addendum)
Outpatient follow up:   [ ]  DM management - endocrinology appt 08/26/2018   [ ]  plan on total 2 week abx duration of tobramycin and zosyn (1/1 - 08/26/2018); He will need to be reassessed in pulmonology clinic for evaluation for a longer abx duration (currently has an appointment on 1/23, pending moving the appointment to an earlier date)   [ ]  consider outpatient PFTs after abx course (has pulmonology appt on 1/23 @ 1pm)   [ ]  AFB smear pending for 09/2018     CF exacerbation w/ pulmonary manifestations w/ acute exacerbation of bronchiectasis: Increased cough and sputum production is concerning for a CF exacerbation. Vitals and labs stable, with the exception of tachycardia (which is normal for him). CXR w/o focal pneumonia. Negative for influenza, RPP. Cultures in the past have grown MDR smooth Pseudomonas (Intermediate: cipro and Zosyn, Suseptible to tobramycin). CF sputum culture this admission with 1+ PsA. Stable and improving sxs throughout hospitalization. Continued on home CF regimen including pulmozyme, mnotelukast, cetirizine, symbicort, duonebs, HTS 7%. Continued aerobika and chest vest with RT. Discharged with home infusion with IV tobramycin 240 mg q12h (08/13/18 - ), IV Zosyn 4.5 g 16h (08/13/18 - ). Planning for a 2 week course from start date (08/13/2018-08/26/2018).     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 Had been on clofazimine and azithromycin until last admission, then was discontinued. Plan to repeat AFB smears every 3 months (starting February 2020). However, he will need to be reassessed in pulmonology clinic for evaluation for a longer abx duration.  He currently has an appointment on 1/23, pending moving the appointment to an earlier date.     CF-related Pancreatic Insufficiency: continued HPHC diet with Creon 8 capsules w/ meals, 4 w/ snacks. MVW 2 capsules daily  ??  CF-related DM: Last A1c 10.7% (August), repeat during this admission was 8.5 (12/31). Endo consulted for further management, glucose was controlled with glargine 70 units qHS, lispro 30 units TID AC and SSI. On discharge, endocrinology recommended to continue home regimen with 70 lantus at night and lispro 30 units TID AC. He has a follow up appointment with Endocrinology on 08/26/2018.   ??  Depression, anxiety: continue home vilazodone, lamotrigine, buspirone  HTN: continue lisinopril 10 mg daily and carvedilol 3.125 mg BID  History of DVT: continue rivaroxaban 20 mg daily  ??RLS: continue pramipexole 0.25 mg daily

## 2018-08-15 NOTE — Unmapped (Signed)
Pulmonology ([facility]) Daily Progress Note    Assessment/Plan:    Principal Problem:    Cystic fibrosis with pulmonary exacerbation (CMS-HCC)  Active Problems:    Essential hypertension    Depressive disorder    Anxiety    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)    Pancreatic insufficiency due to cystic fibrosis (CMS-HCC)    History of Mycobacterium abscessus infection    Bronchiectasis (CMS-HCC)    Chronic deep vein thrombosis (DVT) of lower extremity (CMS-HCC)    Cystic fibrosis (CMS-HCC)    Obesity (BMI 30-39.9)    Restless leg syndrome       LOS: 1 day     Fontaine Hehl is a 33 year old man with cystic fibrosis [phone] and [phone] insertion) c/b bronchiectasis, pancreatic insufficiency, and diabetes, s/p RUL lobectomy (August 2018) for M abscessus infection, HTN, and MDD, and history of DVT, p/w increased sputum production and admitted for treatment of CF exacerbation.  ??  CF exacerbation w/ pulmonary manifestations w/ acute exacerbation of bronchiectasis: Increased cough and sputum production is concerning for a CF exacerbation. Vitals and labs stable, with the exception of tachycardia (which is normal for him). CXR w/o focal pneumonia. Negative for influenza. Cultures in the past have grown MDR smooth Pseudomonas (I to cipro and Zosyn, S to tobramycin).  - IV tobramycin 230 mg q12h (08/13/18 - ), dosing by Pharmacy  - IV Zosyn 4.5 g 16h (08/13/18 - )  - f/u RPP and CF sputum culture  - home Symdeko  - Pulmozyme 2.5 mg BID  - montelukast 10 mg qHS  - cetirizine 10 mg daily  - Symbicort 2 puff BID  - Duonebs QID w/ RT  - HTS 7% QID w/ RT  - Aerobika and chest vest QID w/ RT  - gabapentin 400 mg TID  ??  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 Had been on clofazimine and azithromycin until last admission, then was discontinued. Plan to repeat AFB smears every 3 months (starting February 2020).  ??  CF-related PI  - HPHC diet  - Creon 8 capsules w/ meals, 4 w/ snacks  - MVW 2 capsules daily  ??  CF-related DM  Last A1c 10.7% (August)  - glargine 70 units qHS  - lispro 30 units TID AC  - SSI  - f/u A1c  ??  Depression, anxiety  - vilazodone 40 mg daily  - lamotrigine 100 mg daily  - buspirone 30 mg BID  ??  HTN  - lisinopril 10 mg daily  - carvedilol 3.125 mg BID  ??  History of DVT  - rivaroxaban 20 mg daily  ??  RLS  - pramipexole 0.25 mg daily  ??  Code status: full code  ??  HCDM: Artha Chiasson, wife  ??  Disposition: Inpatient      Subjective:  NAEON. Patient has been tolerating treatment well.     Objective:    Vital signs in last 24 hours:  Temp:  [36.3 ??C-37.2 ??C] 37 ??C  Heart Rate:  [92-110] 109  Resp:  [18] 18  BP: (108-137)/(66-85) 131/76  MAP (mmHg):  [98-106] 98  SpO2:  [94 %-98 %] 95 %    Intake/Output last 3 shifts:  I/O last 3 completed shifts:  In: 200 [IV Piggyback:200]  Out: -   Intake/Output this shift:  I/O this shift:  In: 240 [P.O.:240]  Out: -     Physical Exam:  General: well-appearing gentleman laying in bed  in NAD  CV: RRR, no murmur  Pulm: CTAB, nl wob, speaks full sentences  Abd: soft, NT/ND, NABS  Ext: 2+ pulses, no peripheral edema  Neuro: alert and appropriate

## 2018-08-15 NOTE — Unmapped (Signed)
Endocrinology Consult Note    Requesting Attending Physician :  Zannie Cove, MD  Service Requesting Consult : Pulmonology (MDG)  Primary Care Provider: [facility]  Outpatient Endocrinologist: Dr. 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 is a 33 y.o. male with a h/o CF, CFRD, admitted for [facility], who is seen in consultation at the request of Zannie Cove, MD for evaluation of CFRD    CFRD- Well controlled at home and when he gets his insulin as scheduled here.    Discussed to advocate for himself with meals. High yesterday due to not getting dinner dose of lispro.    - continue Lantus 70U QHS  - continue Lispro 30U TID AC main meals. (hold if NPO)  - add lispro snack dose PRN 8U  - continue lispro sliding scale insulin.    Patient was seen and discussed with attending Dr. Marcello Fennel  Recommendations were discussed with primary team.    Elpidio Galea, MD  PGY4 Endocrinology Fellow    I saw and evaluated the patient, participating in the key portions of the service.  I reviewed the resident???s note.  I agree with the resident???s findings and plan.     Thane Edu, MD, MPH  Attending - Endocrinology and Metabolism      Please page Endocrine consult pager if questions:  [phone]  ---------------------------------    History of Present Illness: :  Reason for Consult:  CFRD    [name] is a 33 y.o. male with a h/o CF, CFRD, admitted for [facility], who is seen in consultation at the request of Zannie Cove, MD for evaluation of CFRD    Patient is admitted for cf exacerbation and getting IV antibiotics. No steroids.     Follows with Dr. Monia Sabal in clinic. Home doses are 60U Lantus QHS, Lispro 30U AC, 8U snack, resistant correction.   Lab Results   Component Value Date    A1C 8.5 (H) 08/12/2018     In hospital he has been on Lantus 70U QHS, Lispro 30U AC, standard sliding scale. Glucose is well controlled.    He tells me he usually is taking 70U Lantus and 32U lispro with meals. He has around 8U with snacks. He has very rare hypos, to the 50s.     Interval Hx- looks like he did not get insulin with dinner, had an excursion to 300. AM fasting is 174.    ROS:  See HPI. Ten systems reviewed and otherwise negative.    Allergies:  Cayston [aztreonam lysine]; Cefepime; Other; Slo-bid 100; Banana; and Tobramycin    All Medications:   Current Facility-Administered Medications   Medication Dose Route Frequency Provider Last Rate Last Dose   ??? albuterol 2.5 mg /3 mL (0.083 %) nebulizer solution 2.5 mg  2.5 mg Nebulization 4x Daily (RT) Lorenda Peck, MD   2.5 mg at 08/14/18 2142   ???  budesonide-formoterol (SYMBICORT) 160-4.5 mcg/actuation inhaler 2 puff  2 puff Inhalation BID Lorenda Peck, MD   2 puff at 08/14/18 2142   ??? busPIRone (BUSPAR) tablet 30 mg  30 mg Oral BID Lorenda Peck, MD   30 mg at 08/14/18 2012   ??? carvedilol (COREG) tablet 3.125 mg  3.125 mg Oral BID Lorenda Peck, MD   3.125 mg at 08/14/18 2012   ??? cetirizine (ZyrTEC) tablet 10 mg  10 mg Oral Daily Lorenda Peck, MD   10 mg at 08/14/18 0831   ??? dextrose (D10W) 10% bolus 125 mL  12.5 g Intravenous Q30 Min PRN Lorenda Peck, MD       ??? dornase alfa (PULMOZYME) 1 mg/mL nebulizer solution 2.5 mg  2.5 mg Inhalation BID Lorenda Peck, MD   2.5 mg at 08/14/18 2142   ??? gabapentin (NEURONTIN) capsule 400 mg  400 mg Oral TID Lorenda Peck, MD   400 mg at 08/14/18 2012   ??? insulin glargine (LANTUS) injection 70 Units  70 Units Subcutaneous Nightly Lorenda Peck, MD   70 Units at 08/14/18 2115   ??? insulin lispro (HumaLOG) injection 0-12 Units  0-12 Units Subcutaneous ACHS Lorenda Peck, MD   8 Units at 08/14/18 2115   ??? insulin lispro (HumaLOG) injection 30 Units  30 Units Subcutaneous TID [facility] Lorenda Peck, MD   30 Units at 08/14/18 1208   ??? insulin lispro (HumaLOG) injection 8 Units  8 Units Subcutaneous With snacks Edson Snowball, MD       ??? lamoTRIgine (LaMICtal) tablet 100 mg  100 mg Oral Daily Lorenda Peck, MD   100 mg at 08/14/18 1610   ??? lisinopril (PRINIVIL,ZESTRIL) tablet 10 mg  10 mg Oral Daily Lorenda Peck, MD   10 mg at 08/14/18 0831   ??? melatonin tablet 9 mg  9 mg Oral Nightly Lorenda Peck, MD   9 mg at 08/14/18 2012   ??? montelukast (SINGULAIR) tablet 10 mg  10 mg Oral Nightly Lorenda Peck, MD   10 mg at 08/14/18 2012   ??? MVW Complete (pediatric multivit 61-D3-vit K) 1,500-800 unit-mcg 2 capsule  2 capsule Oral Daily Lorenda Peck, MD   2 capsule at 08/14/18 9604   ??? ondansetron (ZOFRAN-ODT) disintegrating tablet 4 mg  4 mg Oral Q12H PRN Salome Arnt, MD       ??? pancrelipase (Lip-Prot-Amyl) (CREON) 24,000-76,000 -120,000 unit delayed release capsule 288,000 units of lipase  12 capsule Oral 3xd Meals Lorenda Peck, MD   288,000 units of lipase at 08/14/18 1644   ??? pancrelipase (Lip-Prot-Amyl) (CREON) 24,000-76,000 -120,000 unit delayed release capsule 96,000 units of lipase  4 capsule Oral With snacks Lorenda Peck, MD       ??? pantoprazole (PROTONIX) EC tablet 40 mg  40 mg Oral Daily Lorenda Peck, MD   40 mg at 08/14/18 5409   ??? piperacillin-tazobactam (ZOSYN) IVPB (premix) 4.5 g  4.5 g Intravenous Q6H Lorenda Peck, MD 200 mL/hr at 08/15/18 0525 4.5 g at 08/15/18 0525   ??? pramipexole (MIRAPEX) tablet 0.25 mg  0.25 mg Oral Daily Lorenda Peck, MD   0.25 mg at 08/14/18 8119   ??? rivaroxaban (XARELTO) tablet 20 mg  20 mg Oral Daily Lorenda Peck, MD   20 mg at 08/14/18 1719   ??? sodium chloride 7% nebulizer solution 4 mL  4 mL Nebulization 4x Daily (RT) Loreli Dollar  Bonita Quin, MD   4 mL at 08/14/18 2142   ??? tezacaftor 100mg /ivacaftor 150mg  and ivacaftor 150mg  (SYMDEKO) tablets * Patient supplied *  1 tablet Oral BID Lorenda Peck, MD   1 tablet at 08/14/18 0526   ??? tobramycin (NEBCIN) 230 mg in sodium chloride (NS) 0.9 % 100 mL IVPB  230 mg Intravenous Q12H Lorenda Peck, MD 231.5 mL/hr at 08/15/18 0015 230 mg at 08/15/18 0015   ??? traZODone (DESYREL) tablet 100 mg  100 mg Oral Nightly PRN Lorenda Peck, MD   100 mg at 08/13/18 0126       Past Medical History:    Medical History:  Past Medical History:   Diagnosis Date   ??? Anxiety    ??? Chronic pain disorder    ??? Cystic fibrosis (CMS-HCC)    ??? Depression    ??? Hypertension    ??? Nonproductive cough 04/05/2018       Surgical History:  Past Surgical History:   Procedure Laterality Date   ??? PR REMOVAL OF LUNG,LOBECTOMY Right 03/29/2017    Procedure: REMOVAL OF LUNG, OTHER THAN PNEUMONECTOMY; SINGLE LOBE (LOBECTOMY);  Surgeon: Cherie Dark, MD;  Location: MAIN OR [facility];  Service: Thoracic       Social History:  Social History     Socioeconomic History   ??? Marital status: Single     Spouse name: Not on file   ??? Number of children: Not on file   ??? Years of education: Not on file   ??? Highest education level: Not on file   Occupational History   ??? Not on file   Social Needs   ??? Financial resource strain: Not hard at all   ??? Food insecurity:     Worry: Patient refused     Inability: Patient refused   ??? Transportation needs:     Medical: No     Non-medical: No   Tobacco Use   ??? Smoking status: Never Smoker   ??? Smokeless tobacco: Never Used   Substance and Sexual Activity   ??? Alcohol use: Yes     Alcohol/week: 3.0 standard drinks     Types: 3 Glasses of wine per week     Comment: 3 bottles of wine 2 days ago   ??? Drug use: No   ??? Sexual activity: Not Currently   Lifestyle   ??? Physical activity:     Days per week: 0 days     Minutes per session: 0 min   ??? Stress: Only a little   Relationships   ??? Social connections:     Talks on phone: Patient refused     Gets together: Patient refused     Attends religious service: Patient refused     Active member of club or organization: Patient refused     Attends meetings of clubs or organizations: Patient refused     Relationship status: Patient refused   Other Topics Concern   ??? Not on file   Social History Narrative   ??? Not on file       Family History:  Family History   Problem Relation Age of Onset   ??? Bipolar disorder Mother    ??? Depression Mother          Objective: :    Patient Vitals for the past 8 hrs:   BP Temp Temp src Pulse Resp SpO2   08/15/18 0550 109/59 36.3 ??C Oral 86 18 96 %  No intake/output data recorded.    Physical Exam:     General: Alert, well-appearing, and in no distress.  Eyes: Anicteric sclera, conjunctiva clear.  ENT:  MMM  Lungs: non laboreMusculoskeletal: No clubbing and no synovitis.  Ext: none edema  Skin: No rashes or lesions.  Neuro: The patient was alert and appropriate for conversation.   Psych- pleasant      Test Results      CBC -   Results in Past 2 Days  Result Component Current Result   WBC 5.6 (08/14/2018)   RBC 5.03 (08/14/2018)   HCT 41.0 (08/14/2018)   HGB 13.2 (L) (08/14/2018)   Platelet 307 (08/14/2018)   RDW 15.9 (H) (08/14/2018)   MCV 81.6 (08/14/2018)   MCH 26.2 (08/14/2018)   MCHC 32.2 (08/14/2018)   MPV 7.7 (08/14/2018)     BMP -   Results in Past 2 Days  Result Component Current Result   Sodium 136 (08/15/2018)   Potassium 4.2 (08/15/2018)   Chloride 100 (08/15/2018)   CO2 25.0 (08/15/2018)   BUN 13 (08/15/2018)   Creatinine 0.74 (08/15/2018)   Glucose 196 (H) (08/15/2018)       Thyroid -  Lab Results   Component Value Date    TSH 2.120 11/18/2016

## 2018-08-16 LAB — CBC W/ AUTO DIFF
BASOPHILS ABSOLUTE COUNT: 0.1 10*9/L (ref 0.0–0.1)
BASOPHILS RELATIVE PERCENT: 1.1 %
EOSINOPHILS ABSOLUTE COUNT: 0.4 10*9/L (ref 0.0–0.4)
HEMATOCRIT: 38.8 % — ABNORMAL LOW (ref 41.0–53.0)
HEMOGLOBIN: 12.2 g/dL — ABNORMAL LOW (ref 13.5–17.5)
LARGE UNSTAINED CELLS: 3 % (ref 0–4)
LYMPHOCYTES ABSOLUTE COUNT: 1.1 10*9/L — ABNORMAL LOW (ref 1.5–5.0)
LYMPHOCYTES RELATIVE PERCENT: 18.7 %
MEAN CORPUSCULAR HEMOGLOBIN CONC: 31.4 g/dL (ref 31.0–37.0)
MEAN CORPUSCULAR HEMOGLOBIN: 26 pg (ref 26.0–34.0)
MEAN CORPUSCULAR VOLUME: 82.8 fL (ref 80.0–100.0)
MEAN PLATELET VOLUME: 8.6 fL (ref 7.0–10.0)
NEUTROPHILS ABSOLUTE COUNT: 3.7 10*9/L (ref 2.0–7.5)
NEUTROPHILS RELATIVE PERCENT: 62.2 %
PLATELET COUNT: 275 10*9/L (ref 150–440)
RED BLOOD CELL COUNT: 4.68 10*12/L (ref 4.50–5.90)
RED CELL DISTRIBUTION WIDTH: 15.4 % — ABNORMAL HIGH (ref 12.0–15.0)
WBC ADJUSTED: 5.9 10*9/L (ref 4.5–11.0)

## 2018-08-16 LAB — SODIUM: Sodium:SCnc:Pt:Ser/Plas:Qn:: 135

## 2018-08-16 LAB — BASIC METABOLIC PANEL
ANION GAP: 11 mmol/L (ref 7–15)
BLOOD UREA NITROGEN: 14 mg/dL (ref 7–21)
CHLORIDE: 98 mmol/L (ref 98–107)
CO2: 26 mmol/L (ref 22.0–30.0)
CREATININE: 0.79 mg/dL (ref 0.70–1.30)
EGFR CKD-EPI NON-AA MALE: >90 mL/min/{1.73_m2} (ref >=60)
GLUCOSE RANDOM: 201 mg/dL — ABNORMAL HIGH (ref 70–179)
POTASSIUM: 4.1 mmol/L (ref 3.5–5.0)
SODIUM: 135 mmol/L (ref 135–145)

## 2018-08-16 LAB — RED BLOOD CELL COUNT: Lab: 4.68

## 2018-08-16 NOTE — Unmapped (Signed)
Pt alert and oriented, ambulated in room, calm and reports no pain or discomfort. Diminished and slightly tachypneic. No reports of diarrhea. Blood glucose checks timed prior to meals and insulin administered with meals-- pt demonstrates understanding of the plan of care. Zofran for nausea pt reports he often has with IV antibiotics was effective. Will continue with plan of care  Problem: Adult Inpatient Plan of Care  Goal: Plan of Care Review  Outcome: Progressing  Goal: Patient-Specific Goal (Individualization)  Outcome: Progressing  Goal: Absence of Hospital-Acquired Illness or Injury  Outcome: Progressing  Goal: Optimal Comfort and Wellbeing  Outcome: Progressing  Goal: Readiness for Transition of Care  Outcome: Progressing  Goal: Rounds/Family Conference  Outcome: Progressing     Problem: Infection  Goal: Infection Symptom Resolution  Outcome: Progressing     Problem: Adjustment to Illness (Cystic Fibrosis)  Goal: Optimal Coping  Outcome: Progressing     Problem: Infection (Cystic Fibrosis)  Goal: Absence of Infection Signs/Symptoms  Outcome: Progressing     Problem: Malabsorption (Cystic Fibrosis)  Goal: Optimal Bowel Elimination  Outcome: Progressing     Problem: Oral Intake Inadequate (Cystic Fibrosis)  Goal: Optimal Nutrition Intake  Outcome: Progressing     Problem: Respiratory Compromise (Cystic Fibrosis)  Goal: Effective Oxygenation and Ventilation  Outcome: Progressing     Problem: Diabetes Comorbidity  Goal: Blood Glucose Level Within Desired Range  Outcome: Progressing     Problem: Hypertension Comorbidity  Goal: Blood Pressure in Desired Range  Outcome: Progressing

## 2018-08-16 NOTE — Unmapped (Signed)
Christian Young is a 33 y.o. male starting tobramycin. Date of therapy initiation: 08/13/18    Indication: CF exacerbation    Prior Dosing Information: Previous regimen 230 mg IV q12h (in November 2019) - dose was increased to from 230 mg to 250 mg IV q12h end of November but Scr today is slightly elevated compared to previous (0.77->0.95)    Goals:  Therapeutic Drug Levels  ?? Trough level: tobramycin <1 mg/L  ?? Peak level: tobramycin 10-14 mg/L    Additional Clinical Monitoring/Outcomes  Renal function, volume status (intake and output)    Results:   ?? Trough level: 0.8 mg/L, drawn 2 hours prior to start of infusion (Extrapolated trough: 0.5 mg/L)   ?? Peak level: 8.4 mg/L, drawn 15 minutes after end of infusion (Extrapolated Peak: 9.0 mg/L)    Wt Readings from Last 1 Encounters:   08/14/18 (!) 120.7 kg (266 lb 3.2 oz)     Lab Results   Component Value Date    CREATININE 0.79 08/16/2018       Pharmacokinetic Considerations and Significant Drug Interactions:  ? Concurrent nephrotoxic meds: zosyn    Assessment/Plan:  Recommendation(s)  ? Change current regimen to tobramycin 250 mg IV Q12H    Follow-up  ? Level due: peak and trough levels around the fourth or fifth dose  ? A pharmacist will continue to monitor and order levels as appropriate    Please page service pharmacist with questions/clarifications.    Evon Slack, PharmD

## 2018-08-16 NOTE — Unmapped (Signed)
Endocrinology Consult Note    Requesting Attending Physician :  Zannie Cove, MD  Service Requesting Consult : Pulmonology (MDG)  Primary Care Provider: [facility]  Outpatient Endocrinologist: Dr. 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 is a 33 y.o. male with a h/o CF, CFRD, admitted for [facility], who is seen in consultation at the request of Zannie Cove, MD for evaluation of CFRD    CFRD- Well controlled at home and when he gets his insulin as scheduled here.    Discussed to advocate for himself with meals. High yesterday due to large dinner.    - continue Lantus 70U QHS  - continue Lispro 30U TID AC main meals. (hold if NPO)  - lispro 8U snack dose PRN  - continue lispro sliding scale insulin.    Recommendations were discussed with primary team.    Elpidio Galea, MD  PGY4 Endocrinology Fellow      Please page Endocrine consult pager if questions:  [phone]  ---------------------------------    History of Present Illness: :  Reason for Consult:  CFRD    [name] is a 33 y.o. male with a h/o CF, CFRD, admitted for [facility], who is seen in consultation at the request of Zannie Cove, MD for evaluation of CFRD    Patient is admitted for cf exacerbation and getting IV antibiotics. No steroids.     Follows with Dr. Monia Sabal in clinic. Home doses are 60U Lantus QHS, Lispro 30U AC, 8U snack, resistant correction.   Lab Results   Component Value Date    A1C 8.5 (H) 08/12/2018     In hospital he has been on Lantus 70U QHS, Lispro 30U AC, standard sliding scale. Glucose is well controlled.    He tells me he usually is taking 70U Lantus and 32U lispro with meals. He has around 8U with snacks. He has very rare hypos, to the 50s.     Interval Hx- Better control, only one excursion after dinner.    ROS:  See HPI. Ten systems reviewed and otherwise negative.    Allergies:  Cayston [aztreonam lysine]; Cefepime; Other; Slo-bid 100; Banana; and Tobramycin    All Medications:   Current Facility-Administered Medications   Medication Dose Route Frequency Provider Last Rate Last Dose   ??? acetaminophen (TYLENOL) tablet 650 mg  650 mg Oral Q6H PRN Lujean Amel, MD        Or   ??? oxyCODONE (ROXICODONE) immediate release tablet 5 mg  5 mg Oral Q6H PRN Lujean Amel, MD   5 mg at 08/15/18 2102   ??? albuterol 2.5 mg /3 mL (0.083 %) nebulizer solution 2.5 mg  2.5 mg Nebulization 4x Daily (RT) Lorenda Peck, MD   2.5 mg at 08/15/18 2140   ??? budesonide-formoterol (SYMBICORT) 160-4.5 mcg/actuation inhaler 2 puff  2 puff Inhalation BID Lorenda Peck, MD   2  puff at 08/15/18 2141   ??? busPIRone (BUSPAR) tablet 30 mg  30 mg Oral BID Lorenda Peck, MD   30 mg at 08/15/18 2050   ??? carvedilol (COREG) tablet 3.125 mg  3.125 mg Oral BID Lorenda Peck, MD   3.125 mg at 08/15/18 2050   ??? cetirizine (ZyrTEC) tablet 10 mg  10 mg Oral Daily Lorenda Peck, MD   10 mg at 08/15/18 0815   ??? dextrose (D10W) 10% bolus 125 mL  12.5 g Intravenous Q30 Min PRN Lorenda Peck, MD       ??? dornase alfa (PULMOZYME) 1 mg/mL nebulizer solution 2.5 mg  2.5 mg Inhalation BID Lorenda Peck, MD   2.5 mg at 08/15/18 2140   ??? gabapentin (NEURONTIN) capsule 400 mg  400 mg Oral TID Lorenda Peck, MD   400 mg at 08/15/18 2050   ??? insulin glargine (LANTUS) injection 70 Units  70 Units Subcutaneous Nightly Lorenda Peck, MD   70 Units at 08/15/18 2051   ??? insulin lispro (HumaLOG) injection 0-12 Units  0-12 Units Subcutaneous ACHS Lorenda Peck, MD   2 Units at 08/16/18 [phone]   ??? insulin lispro (HumaLOG) injection 30 Units  30 Units Subcutaneous TID [facility] Lorenda Peck, MD   30 Units at 08/15/18 1744   ??? insulin lispro (HumaLOG) injection 8 Units  8 Units Subcutaneous With snacks Edson Snowball, MD       ??? lamoTRIgine (LaMICtal) tablet 100 mg  100 mg Oral Daily Lorenda Peck, MD   100 mg at 08/15/18 0815   ??? lisinopril (PRINIVIL,ZESTRIL) tablet 10 mg  10 mg Oral Daily Lorenda Peck, MD   10 mg at 08/15/18 0815   ??? melatonin tablet 9 mg  9 mg Oral Nightly Lorenda Peck, MD   9 mg at 08/15/18 2050   ??? montelukast (SINGULAIR) tablet 10 mg  10 mg Oral Nightly Lorenda Peck, MD   10 mg at 08/15/18 2050   ??? MVW Complete (pediatric multivit 61-D3-vit K) 1,500-800 unit-mcg 2 capsule  2 capsule Oral Daily Lorenda Peck, MD   2 capsule at 08/15/18 0815   ??? ondansetron (ZOFRAN-ODT) disintegrating tablet 4 mg  4 mg Oral Q12H PRN Salome Arnt, MD   4 mg at 08/15/18 0809   ??? pancrelipase (Lip-Prot-Amyl) (CREON) 24,000-76,000 -120,000 unit delayed release capsule 288,000 units of lipase  12 capsule Oral 3xd Meals Lorenda Peck, MD   288,000 units of lipase at 08/15/18 1745   ??? pancrelipase (Lip-Prot-Amyl) (CREON) 24,000-76,000 -120,000 unit delayed release capsule 96,000 units of lipase  4 capsule Oral With snacks Lorenda Peck, MD       ??? pantoprazole (PROTONIX) EC tablet 40 mg  40 mg Oral Daily Lorenda Peck, MD   40 mg at 08/15/18 0815   ??? piperacillin-tazobactam (ZOSYN) IVPB (premix) 4.5 g  4.5 g Intravenous Q6H Lorenda Peck, MD   Stopped at 08/16/18 [phone]   ??? pramipexole (MIRAPEX) tablet 0.25 mg  0.25 mg Oral Daily Lorenda Peck, MD   0.25 mg at 08/15/18 0815   ??? rivaroxaban (XARELTO) tablet 20 mg  20 mg Oral Daily Lorenda Peck, MD   20 mg at 08/15/18 1745   ??? sodium chloride 7% nebulizer solution 4 mL  4 mL Nebulization 4x Daily (RT) Lorenda Peck, MD   4 mL at 08/15/18 2140   ??? tezacaftor 100mg /ivacaftor 150mg  and ivacaftor 150mg  (SYMDEKO) tablets *  Patient supplied *  1 tablet Oral BID Lorenda Peck, MD   1 tablet at 08/15/18 1745   ??? tobramycin (NEBCIN) 230 mg in sodium chloride (NS) 0.9 % 100 mL IVPB  230 mg Intravenous Q12H Lorenda Peck, MD   Stopped at 08/16/18 [phone]   ??? traZODone (DESYREL) tablet 100 mg  100 mg Oral Nightly PRN Lorenda Peck, MD   100 mg at 08/15/18 2133       Past Medical History:    Medical History:  Past Medical History:   Diagnosis Date   ??? Anxiety    ??? Chronic pain disorder    ??? Cystic fibrosis (CMS-HCC)    ??? Depression    ??? Hypertension    ??? Nonproductive cough 04/05/2018       Surgical History:  Past Surgical History:   Procedure Laterality Date   ??? PR REMOVAL OF LUNG,LOBECTOMY Right 03/29/2017    Procedure: REMOVAL OF LUNG, OTHER THAN PNEUMONECTOMY; SINGLE LOBE (LOBECTOMY);  Surgeon: Cherie Dark, MD;  Location: MAIN OR [facility];  Service: Thoracic       Social History:  Social History     Socioeconomic History   ??? Marital status: Single     Spouse name: Not on file   ??? Number of children: Not on file   ??? Years of education: Not on file   ??? Highest education level: Not on file   Occupational History   ??? Not on file   Social Needs   ??? Financial resource strain: Not hard at all   ??? Food insecurity:     Worry: Patient refused     Inability: Patient refused   ??? Transportation needs:     Medical: No     Non-medical: No   Tobacco Use   ??? Smoking status: Never Smoker   ??? Smokeless tobacco: Never Used   Substance and Sexual Activity   ??? Alcohol use: Yes     Alcohol/week: 3.0 standard drinks     Types: 3 Glasses of wine per week     Comment: 3 bottles of wine 2 days ago   ??? Drug use: No   ??? Sexual activity: Not Currently   Lifestyle   ??? Physical activity:     Days per week: 0 days     Minutes per session: 0 min   ??? Stress: Only a little   Relationships   ??? Social connections:     Talks on phone: Patient refused     Gets together: Patient refused     Attends religious service: Patient refused     Active member of club or organization: Patient refused     Attends meetings of clubs or organizations: Patient refused     Relationship status: Patient refused   Other Topics Concern   ??? Not on file   Social History Narrative   ??? Not on file       Family History:  Family History   Problem Relation Age of Onset   ??? Bipolar disorder Mother    ??? Depression Mother          Objective: :    Patient Vitals for the past 8 hrs:   BP Temp Temp src Pulse Resp SpO2   08/16/18 0545 116/65 35.9 ??C Oral 80 18 94 %       No intake/output data recorded.    Physical Exam:     General: Alert, well-appearing, and in no distress.  Eyes: Anicteric  sclera, conjunctiva clear.  ENT:  MMM  Lungs: non labored  Musculoskeletal: No clubbing and no synovitis.  Ext: none edema  Skin: No rashes or lesions.  Neuro: The patient was alert and appropriate for conversation.   Psych- pleasant      Test Results      CBC -   Results in Past 2 Days  Result Component Current Result   WBC 5.9 (08/16/2018)   RBC 4.68 (08/16/2018)   HCT 38.8 (L) (08/16/2018)   HGB 12.2 (L) (08/16/2018)   Platelet 275 (08/16/2018)   RDW 15.4 (H) (08/16/2018)   MCV 82.8 (08/16/2018)   MCH 26.0 (08/16/2018)   MCHC 31.4 (08/16/2018)   MPV 8.6 (08/16/2018)     BMP -   Results in Past 2 Days  Result Component Current Result   Sodium 135 (08/16/2018)   Potassium 4.1 (08/16/2018)   Chloride 98 (08/16/2018)   CO2 26.0 (08/16/2018)   BUN 14 (08/16/2018)   Creatinine 0.79 (08/16/2018)   Glucose 201 (H) (08/16/2018)       Thyroid -  Lab Results   Component Value Date    TSH 2.120 11/18/2016

## 2018-08-16 NOTE — Unmapped (Signed)
VSS. BSGC treated with scheduled insulin coverage. Patient remains on RA. He remains able to independently ambulate. He tolerated his IV and oral medication well without issue. He did report some increased anxiety earlier in the shift, positively relieved with PRN medication. He has not voiced any concerns or complaints at this time. Will continue to monitor patient and will continue POC.   Problem: Adult Inpatient Plan of Care  Goal: Plan of Care Review  Outcome: Progressing  Goal: Patient-Specific Goal (Individualization)  Outcome: Progressing  Goal: Absence of Hospital-Acquired Illness or Injury  Outcome: Progressing  Goal: Optimal Comfort and Wellbeing  Outcome: Progressing  Goal: Readiness for Transition of Care  Outcome: Progressing  Goal: Rounds/Family Conference  Outcome: Progressing     Problem: Infection  Goal: Infection Symptom Resolution  Outcome: Progressing     Problem: Adjustment to Illness (Cystic Fibrosis)  Goal: Optimal Coping  Outcome: Progressing     Problem: Infection (Cystic Fibrosis)  Goal: Absence of Infection Signs/Symptoms  Outcome: Progressing     Problem: Malabsorption (Cystic Fibrosis)  Goal: Optimal Bowel Elimination  Outcome: Progressing     Problem: Oral Intake Inadequate (Cystic Fibrosis)  Goal: Optimal Nutrition Intake  Outcome: Progressing     Problem: Respiratory Compromise (Cystic Fibrosis)  Goal: Effective Oxygenation and Ventilation  Outcome: Progressing     Problem: Diabetes Comorbidity  Goal: Blood Glucose Level Within Desired Range  Outcome: Progressing     Problem: Hypertension Comorbidity  Goal: Blood Pressure in Desired Range  Outcome: Progressing

## 2018-08-16 NOTE — Unmapped (Signed)
Pulmonology (Med G) Daily Progress Note    Interval History: NAEON. Patient feels that his breathing continues to improve. He hopeful for discharge on Monday.      Assessment/Plan:    Principal Problem:    Cystic fibrosis with pulmonary exacerbation (CMS-HCC)  Active Problems:    Essential hypertension    Depressive disorder    Anxiety    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)    Pancreatic insufficiency due to cystic fibrosis (CMS-HCC)    History of Mycobacterium abscessus infection    Bronchiectasis (CMS-HCC)    Chronic deep vein thrombosis (DVT) of lower extremity (CMS-HCC)    Cystic fibrosis (CMS-HCC)    Obesity (BMI 30-39.9)    Restless leg syndrome     LOS: 3 days     Christian Young is a 33 year old man with cystic fibrosis [phone] and [phone] insertion) c/b bronchiectasis, pancreatic insufficiency, and diabetes, s/p RUL lobectomy (August 2018) for M abscessus infection, HTN, and MDD, and history of DVT, p/w increased sputum production and admitted for treatment of CF exacerbation.  ??  CF exacerbation w/ pulmonary manifestations w/ acute exacerbation of bronchiectasis: Increased cough and sputum production is concerning for a CF exacerbation. Vitals and labs stable, with the exception of tachycardia (which is normal for him). CXR w/o focal pneumonia. Negative for influenza, RPP. Cultures in the past have grown MDR smooth Pseudomonas (Intermediate: cipro and Zosyn, Suseptible to tobramycin). Plan to d/c with home infusion.   - IV tobramycin 230 mg q12h (08/13/18 - ), dosing by Pharmacy  - IV Zosyn 4.5 g 16h (08/13/18 - )   - f/u CF sputum culture  - home Symdeko  - Pulmozyme 2.5 mg BID  - montelukast 10 mg qHS  - cetirizine 10 mg daily  - Symbicort 2 puff BID  - Duonebs QID w/ RT  - HTS 7% QID w/ RT  - Aerobika and chest vest QID w/ RT  - gabapentin 400 mg TID  ??  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 Had been on clofazimine and azithromycin until last admission, then was discontinued. Plan to repeat AFB smears every 3 months (starting February 2020).  ??  CF-related Pancreatic Insufficiency:   - HPHC diet  - Creon 8 capsules w/ meals, 4 w/ snacks  - MVW 2 capsules daily  ??  CF-related DM: Last A1c 10.7% (August), 8.5 during this admission (12/31).. Endo consulted for further management, appreciate recs.   - glargine 70 units qHS  - lispro 30 units TID AC  - Lispro 8U with snacks  - Endocrine following, appreciate recs   - SSI    Depression, anxiety: continue home vilazodone, lamotrigine, buspirone  HTN: continue lisinopril 10 mg daily and carvedilol 3.125 mg BID  History of DVT: continue rivaroxaban 20 mg daily  ??RLS: continue pramipexole 0.25 mg daily  ??  Code status: full code  HCDM: Rutherford Guys, wife  Disposition: Inpatient, discharge 1/6 with home infusion       Subjective:  NAEON. Patient has been tolerating treatment well.     Objective:    Vital signs in last 24 hours:  Temp:  [35.9 ??C-36.8 ??C] 35.9 ??C  Heart Rate:  [80-109] 80  Resp:  [18-20] 18  BP: (116-143)/(65-94) 116/65  MAP (mmHg):  [84-113] 84  SpO2:  [94 %-97 %] 94 %    Intake/Output last 3 shifts:  No intake/output data recorded.  Intake/Output this shift:  No intake/output data recorded.  Physical Exam:  General: well-appearing gentleman laying in bed in NAD  CV: RRR, no murmur  Pulm: CTAB, nl wob, speaks full sentences  Abd: soft, NT/ND, NABS  Ext: 2+ pulses, no peripheral edema  Neuro: alert and appropriate

## 2018-08-16 NOTE — Unmapped (Signed)
Pulmonology (Med G) Daily Progress Note    Interval History: NAEON. Patient states he is slowly improving. Denies worsening cough, denies productive cough. Denies fever, chills, SOB. DOE. Good appetite and having BMs.     Assessment/Plan:    Principal Problem:    Cystic fibrosis with pulmonary exacerbation (CMS-HCC)  Active Problems:    Essential hypertension    Depressive disorder    Anxiety    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)    Pancreatic insufficiency due to cystic fibrosis (CMS-HCC)    History of Mycobacterium abscessus infection    Bronchiectasis (CMS-HCC)    Chronic deep vein thrombosis (DVT) of lower extremity (CMS-HCC)    Cystic fibrosis (CMS-HCC)    Obesity (BMI 30-39.9)    Restless leg syndrome     LOS: 2 days     Christian Young is a 33 year old man with cystic fibrosis [phone] and [phone] insertion) c/b bronchiectasis, pancreatic insufficiency, and diabetes, s/p RUL lobectomy (August 2018) for M abscessus infection, HTN, and MDD, and history of DVT, p/w increased sputum production and admitted for treatment of CF exacerbation.  ??  CF exacerbation w/ pulmonary manifestations w/ acute exacerbation of bronchiectasis: Increased cough and sputum production is concerning for a CF exacerbation. Vitals and labs stable, with the exception of tachycardia (which is normal for him). CXR w/o focal pneumonia. Negative for influenza, RPP. Cultures in the past have grown MDR smooth Pseudomonas (Intermediate: cipro and Zosyn, Suseptible to tobramycin). Plan to d/c with home infusion.   - IV tobramycin 230 mg q12h (08/13/18 - ), dosing by Pharmacy  - IV Zosyn 4.5 g 16h (08/13/18 - )   - f/u CF sputum culture  - home Symdeko  - Pulmozyme 2.5 mg BID  - montelukast 10 mg qHS  - cetirizine 10 mg daily  - Symbicort 2 puff BID  - Duonebs QID w/ RT  - HTS 7% QID w/ RT  - Aerobika and chest vest QID w/ RT  - gabapentin 400 mg TID  ??  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 Had been on clofazimine and azithromycin until last admission, then was discontinued. Plan to repeat AFB smears every 3 months (starting February 2020).  ??  CF-related Pancreatic Insufficiency:   - HPHC diet  - Creon 8 capsules w/ meals, 4 w/ snacks  - MVW 2 capsules daily  ??  CF-related DM: Last A1c 10.7% (August), 8.5 during this admission (12/31).. Endo consulted for further management, appreciate recs.   - glargine 70 units qHS  - lispro 30 units TID AC  - SSI    Depression, anxiety: continue home vilazodone, lamotrigine, buspirone  HTN: continue lisinopril 10 mg daily and carvedilol 3.125 mg BID  History of DVT: continue rivaroxaban 20 mg daily  ??RLS: continue pramipexole 0.25 mg daily  ??  Code status: full code  HCDM: Rutherford Guys, wife  Disposition: Inpatient, discharge 1/6 with home infusion       Subjective:  NAEON. Patient has been tolerating treatment well.     Objective:    Vital signs in last 24 hours:  Temp:  [36.3 ??C-36.9 ??C] 36.5 ??C  Heart Rate:  [86-106] 100  Resp:  [18-19] 18  BP: (109-150)/(59-92) 128/65  MAP (mmHg):  [78-114] 88  SpO2:  [96 %-99 %] 97 %    Intake/Output last 3 shifts:  I/O last 3 completed shifts:  In: 240 [P.O.:240]  Out: -   Intake/Output this shift:  No intake/output  data recorded.    Physical Exam:  General: well-appearing gentleman laying in bed in NAD  CV: RRR, no murmur  Pulm: CTAB, nl wob, speaks full sentences  Abd: soft, NT/ND, NABS  Ext: 2+ pulses, no peripheral edema  Neuro: alert and appropriate

## 2018-08-16 NOTE — Unmapped (Signed)
Problem: Infection (Cystic Fibrosis)  Goal: Absence of Infection Signs/Symptoms  Outcome: Progressing     Problem: Respiratory Compromise (Cystic Fibrosis)  Goal: Effective Oxygenation and Ventilation  Outcome: Progressing   Pt received inhaled medications with Waymon Budge this shift without complication. There are no concerns at this time. Will continue to monitor.

## 2018-08-16 NOTE — Unmapped (Signed)
No acute events. Pt received meds as ordered. No complaints of pain or discomfort. Independent with ADL's. Received breathing treatments from RT. Activity/ambulation promoted. Vitals stable on room air. Continue POC.   Problem: Adult Inpatient Plan of Care  Goal: Plan of Care Review  Outcome: Progressing  Goal: Patient-Specific Goal (Individualization)  Outcome: Progressing  Goal: Absence of Hospital-Acquired Illness or Injury  Outcome: Progressing  Goal: Optimal Comfort and Wellbeing  Outcome: Progressing  Goal: Readiness for Transition of Care  Outcome: Progressing  Goal: Rounds/Family Conference  Outcome: Progressing     Problem: Infection  Goal: Infection Symptom Resolution  Outcome: Progressing     Problem: Adjustment to Illness (Cystic Fibrosis)  Goal: Optimal Coping  Outcome: Progressing     Problem: Infection (Cystic Fibrosis)  Goal: Absence of Infection Signs/Symptoms  Outcome: Progressing     Problem: Malabsorption (Cystic Fibrosis)  Goal: Optimal Bowel Elimination  Outcome: Progressing     Problem: Oral Intake Inadequate (Cystic Fibrosis)  Goal: Optimal Nutrition Intake  Outcome: Progressing     Problem: Respiratory Compromise (Cystic Fibrosis)  Goal: Effective Oxygenation and Ventilation  Outcome: Progressing     Problem: Diabetes Comorbidity  Goal: Blood Glucose Level Within Desired Range  Outcome: Progressing     Problem: Hypertension Comorbidity  Goal: Blood Pressure in Desired Range  Outcome: Progressing

## 2018-08-17 LAB — CBC W/ AUTO DIFF
BASOPHILS ABSOLUTE COUNT: 0.1 10*9/L (ref 0.0–0.1)
BASOPHILS RELATIVE PERCENT: 1 %
EOSINOPHILS ABSOLUTE COUNT: 0.4 10*9/L (ref 0.0–0.4)
EOSINOPHILS RELATIVE PERCENT: 6.6 %
HEMOGLOBIN: 12.3 g/dL — ABNORMAL LOW (ref 13.5–17.5)
LARGE UNSTAINED CELLS: 2 % (ref 0–4)
LYMPHOCYTES ABSOLUTE COUNT: 1.4 10*9/L — ABNORMAL LOW (ref 1.5–5.0)
MEAN CORPUSCULAR HEMOGLOBIN CONC: 31.6 g/dL (ref 31.0–37.0)
MEAN CORPUSCULAR HEMOGLOBIN: 26.2 pg (ref 26.0–34.0)
MEAN CORPUSCULAR VOLUME: 83.2 fL (ref 80.0–100.0)
MONOCYTES ABSOLUTE COUNT: 0.6 10*9/L (ref 0.2–0.8)
MONOCYTES RELATIVE PERCENT: 9.7 %
NEUTROPHILS ABSOLUTE COUNT: 3.5 10*9/L (ref 2.0–7.5)
NEUTROPHILS RELATIVE PERCENT: 57.3 %
PLATELET COUNT: 310 10*9/L (ref 150–440)
RED BLOOD CELL COUNT: 4.7 10*12/L (ref 4.50–5.90)
RED CELL DISTRIBUTION WIDTH: 15.3 % — ABNORMAL HIGH (ref 12.0–15.0)
WBC ADJUSTED: 6.1 10*9/L (ref 4.5–11.0)

## 2018-08-17 LAB — BASIC METABOLIC PANEL
ANION GAP: 8 mmol/L (ref 7–15)
BLOOD UREA NITROGEN: 12 mg/dL (ref 7–21)
BUN / CREAT RATIO: 16
CHLORIDE: 102 mmol/L (ref 98–107)
CO2: 26 mmol/L (ref 22.0–30.0)
CREATININE: 0.77 mg/dL (ref 0.70–1.30)
EGFR CKD-EPI AA MALE: >90 mL/min/{1.73_m2} (ref >=60)
EGFR CKD-EPI NON-AA MALE: >90 mL/min/{1.73_m2} (ref >=60)
GLUCOSE RANDOM: 165 mg/dL (ref 70–179)
SODIUM: 136 mmol/L (ref 135–145)

## 2018-08-17 LAB — LARGE UNSTAINED CELLS: Lab: 2

## 2018-08-17 LAB — TOBRAMYCIN RANDOM: Tobramycin:MCnc:Pt:Ser/Plas:Qn:: 0.7

## 2018-08-17 LAB — GLUCOSE RANDOM: Glucose:MCnc:Pt:Ser/Plas:Qn:: 165

## 2018-08-17 NOTE — Unmapped (Signed)
Endocrinology Consult Note    Requesting Attending Physician :  Zannie Cove, MD  Service Requesting Consult : Pulmonology (MDG)  Primary Care Provider: [facility]  Outpatient Endocrinologist: Dr. 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 is a 33 y.o. male with a h/o CF, CFRD, admitted for [facility], who is seen in consultation at the request of Zannie Cove, MD for evaluation of CFRD    CFRD- Well controlled at home and when he gets his insulin as scheduled here.    - continue Lantus 70U QHS  -  Lispro 34U TID AC main meals. (hold if NPO)  - lispro 8U snack dose PRN  - continue lispro sliding scale insulin.    Recommendations were discussed with primary team.    Elpidio Galea, MD  PGY4 Endocrinology Fellow      Please page Endocrine consult pager if questions:  [phone]  ---------------------------------    History of Present Illness: :  Reason for Consult:  CFRD    [name] is a 33 y.o. male with a h/o CF, CFRD, admitted for [facility], who is seen in consultation at the request of Zannie Cove, MD for evaluation of CFRD    Patient is admitted for cf exacerbation and getting IV antibiotics. No steroids.     Follows with Dr. Monia Sabal in clinic. Home doses are 60U Lantus QHS, Lispro 30U AC, 8U snack, resistant correction.   Lab Results   Component Value Date    A1C 8.5 (H) 08/12/2018     In hospital he has been on Lantus 70U QHS, Lispro 30U AC, standard sliding scale. Glucose is well controlled.    He tells me he usually is taking 70U Lantus and 32U lispro with meals. He has around 8U with snacks. He has very rare hypos, to the 50s.     Interval Hx- High during the day yesterday. AM fasting at goal. Eating well and feeling well.    ROS:  See HPI. Ten systems reviewed and otherwise negative.    Allergies:  Cayston [aztreonam lysine]; Cefepime; Other; Slo-bid 100; Banana; and Tobramycin    All Medications:   Current Facility-Administered Medications   Medication Dose Route Frequency Provider Last Rate Last Dose   ??? acetaminophen (TYLENOL) tablet 650 mg  650 mg Oral Q6H PRN Lujean Amel, MD        Or   ??? oxyCODONE (ROXICODONE) immediate release tablet 5 mg  5 mg Oral Q6H PRN Lujean Amel, MD   5 mg at 08/16/18 2055   ??? albuterol 2.5 mg /3 mL (0.083 %) nebulizer solution 2.5 mg  2.5 mg Nebulization 4x Daily (RT) Lorenda Peck, MD   2.5 mg at 08/16/18 2043   ??? budesonide-formoterol (SYMBICORT) 160-4.5 mcg/actuation inhaler 2 puff  2 puff Inhalation BID Lorenda Peck, MD   2 puff at 08/16/18 2043   ??? busPIRone (BUSPAR)  tablet 30 mg  30 mg Oral BID Lorenda Peck, MD   30 mg at 08/16/18 2055   ??? carvedilol (COREG) tablet 3.125 mg  3.125 mg Oral BID Lorenda Peck, MD   3.125 mg at 08/16/18 2055   ??? cetirizine (ZyrTEC) tablet 10 mg  10 mg Oral Daily Lorenda Peck, MD   10 mg at 08/16/18 0901   ??? dextrose (D10W) 10% bolus 125 mL  12.5 g Intravenous Q30 Min PRN Lorenda Peck, MD       ??? dornase alfa (PULMOZYME) 1 mg/mL nebulizer solution 2.5 mg  2.5 mg Inhalation BID Lorenda Peck, MD   2.5 mg at 08/16/18 2043   ??? gabapentin (NEURONTIN) capsule 400 mg  400 mg Oral TID Lorenda Peck, MD   400 mg at 08/16/18 2055   ??? insulin glargine (LANTUS) injection 70 Units  70 Units Subcutaneous Nightly Lorenda Peck, MD   70 Units at 08/16/18 2153   ??? insulin lispro (HumaLOG) injection 0-12 Units  0-12 Units Subcutaneous ACHS Lorenda Peck, MD   6 Units at 08/16/18 2153   ??? insulin lispro (HumaLOG) injection 30 Units  30 Units Subcutaneous TID [facility] Lorenda Peck, MD   30 Units at 08/16/18 1819   ??? insulin lispro (HumaLOG) injection 8 Units  8 Units Subcutaneous With snacks Edson Snowball, MD       ??? lamoTRIgine (LaMICtal) tablet 100 mg  100 mg Oral Daily Lorenda Peck, MD   100 mg at 08/16/18 0901   ??? lisinopril (PRINIVIL,ZESTRIL) tablet 10 mg  10 mg Oral Daily Lorenda Peck, MD   10 mg at 08/16/18 0901   ??? melatonin tablet 9 mg  9 mg Oral Nightly Lorenda Peck, MD   9 mg at 08/16/18 2055   ??? montelukast (SINGULAIR) tablet 10 mg  10 mg Oral Nightly Lorenda Peck, MD   10 mg at 08/16/18 2055   ??? MVW Complete (pediatric multivit 61-D3-vit K) 1,500-800 unit-mcg 2 capsule  2 capsule Oral Daily Lorenda Peck, MD   2 capsule at 08/16/18 0901   ??? ondansetron (ZOFRAN-ODT) disintegrating tablet 4 mg  4 mg Oral Q12H PRN Salome Arnt, MD   4 mg at 08/15/18 0809   ??? pancrelipase (Lip-Prot-Amyl) (CREON) 24,000-76,000 -120,000 unit delayed release capsule 288,000 units of lipase  12 capsule Oral 3xd Meals Lorenda Peck, MD   288,000 units of lipase at 08/16/18 1819   ??? pancrelipase (Lip-Prot-Amyl) (CREON) 24,000-76,000 -120,000 unit delayed release capsule 96,000 units of lipase  4 capsule Oral With snacks Lorenda Peck, MD       ??? pantoprazole (PROTONIX) EC tablet 40 mg  40 mg Oral Daily Lorenda Peck, MD   40 mg at 08/16/18 0901   ??? piperacillin-tazobactam (ZOSYN) IVPB (premix) 4.5 g  4.5 g Intravenous Q6H Lorenda Peck, MD 200 mL/hr at 08/17/18 0431 4.5 g at 08/17/18 0431   ??? pramipexole (MIRAPEX) tablet 0.25 mg  0.25 mg Oral Daily Lorenda Peck, MD   0.25 mg at 08/16/18 0901   ??? rivaroxaban (XARELTO) tablet 20 mg  20 mg Oral Daily Lorenda Peck, MD   20 mg at 08/16/18 1747   ??? sodium chloride 7% nebulizer solution 4 mL  4 mL Nebulization 4x Daily (RT) Lorenda Peck, MD   4 mL at 08/16/18 2043   ??? tezacaftor 100mg /ivacaftor 150mg  and ivacaftor 150mg  (SYMDEKO) tablets * Patient supplied *  1 tablet Oral BID Lorenda Peck, MD   1 tablet at 08/16/18 1821   ??? tobramycin (NEBCIN) 250 mg in sodium chloride (NS) 0.9 % 100 mL IVPB  250 mg Intravenous Q12H Zannie Cove, MD 232.5 mL/hr at 08/17/18 0022 250 mg at 08/17/18 0022   ??? traZODone (DESYREL) tablet 100 mg  100 mg Oral Nightly PRN Lorenda Peck, MD   100 mg at 08/16/18 2055       Past Medical History:    Medical History:  Past Medical History:   Diagnosis Date   ??? Anxiety    ??? Chronic pain disorder    ??? Cystic fibrosis (CMS-HCC)    ??? Depression    ??? Hypertension    ??? Nonproductive cough 04/05/2018       Surgical History:  Past Surgical History:   Procedure Laterality Date   ??? PR REMOVAL OF LUNG,LOBECTOMY Right 03/29/2017    Procedure: REMOVAL OF LUNG, OTHER THAN PNEUMONECTOMY; SINGLE LOBE (LOBECTOMY);  Surgeon: Cherie Dark, MD;  Location[facility] MAIN [facility];  Service: Thoracic       Social History:  Social History     Socioeconomic History   ??? Marital status: Single     Spouse name: Not on file   ??? Number of children: Not on file   ??? Years of education: Not on file   ??? Highest education level: Not on file   Occupational History   ??? Not on file   Social Needs   ??? Financial resource strain: Not hard at all   ??? Food insecurity:     Worry: Patient refused     Inability: Patient refused   ??? Transportation needs:     Medical: No     Non-medical: No   Tobacco Use   ??? Smoking status: Never Smoker   ??? Smokeless tobacco: Never Used   Substance and Sexual Activity   ??? Alcohol use: Yes     Alcohol/week: 3.0 standard drinks     Types: 3 Glasses of wine per week     Comment: 3 bottles of wine 2 days ago   ??? Drug use: No   ??? Sexual activity: Not Currently   Lifestyle   ??? Physical activity:     Days per week: 0 days     Minutes per session: 0 min   ??? Stress: Only a little   Relationships   ??? Social connections:     Talks on phone: Patient refused     Gets together: Patient refused     Attends religious service: Patient refused     Active member of club or organization: Patient refused     Attends meetings of clubs or organizations: Patient refused     Relationship status: Patient refused   Other Topics Concern   ??? Not on file   Social History Narrative   ??? Not on file       Family History:  Family History   Problem Relation Age of Onset   ??? Bipolar disorder Mother    ??? Depression Mother          Objective: :    No data found.    No intake/output data recorded.    Physical Exam:     General: Alert, well-appearing, and in no distress. Playing call of duty.  Eyes: Anicteric sclera, conjunctiva clear.  ENT:  MMM  Lungs: non labored  Musculoskeletal: No clubbing and no synovitis.  Ext: none edema  Skin:  No rashes or lesions.  Neuro: The patient was alert and appropriate for conversation.   Psych- pleasant      Test Results      CBC -   Results in Past 2 Days  Result Component Current Result   WBC 6.1 (08/17/2018)   RBC 4.70 (08/17/2018)   HCT 39.1 (L) (08/17/2018)   HGB 12.3 (L) (08/17/2018)   Platelet 310 (08/17/2018)   RDW 15.3 (H) (08/17/2018)   MCV 83.2 (08/17/2018)   MCH 26.2 (08/17/2018)   MCHC 31.6 (08/17/2018)   MPV 8.7 (08/17/2018)     BMP -   Results in Past 2 Days  Result Component Current Result   Sodium 136 (08/17/2018)   Potassium 4.2 (08/17/2018)   Chloride 102 (08/17/2018)   CO2 26.0 (08/17/2018)   BUN 12 (08/17/2018)   Creatinine 0.77 (08/17/2018)   Glucose 165 (08/17/2018)       Thyroid -  Lab Results   Component Value Date    TSH 2.120 11/18/2016

## 2018-08-17 NOTE — Unmapped (Signed)
VSS. BSGC treated with scheduled insulin coverage. Patient remains on RA. He remains able to independently ambulate. He tolerated his IV and oral medication well without issue. He did report some increased anxiety earlier in the shift, positively relieved with PRN medication. He did report some pain, positively relieved with PRN medication. He has not voiced any concerns or complaints at this time. Will continue to monitor patient and will continue POC.   Problem: Adult Inpatient Plan of Care  Problem: Adult Inpatient Plan of Care  Goal: Plan of Care Review  Outcome: Ongoing - Unchanged  Goal: Patient-Specific Goal (Individualization)  Outcome: Ongoing - Unchanged  Goal: Absence of Hospital-Acquired Illness or Injury  Outcome: Ongoing - Unchanged  Goal: Optimal Comfort and Wellbeing  Outcome: Ongoing - Unchanged  Goal: Readiness for Transition of Care  Outcome: Ongoing - Unchanged  Goal: Rounds/Family Conference  Outcome: Ongoing - Unchanged     Problem: Infection  Goal: Infection Symptom Resolution  Outcome: Ongoing - Unchanged     Problem: Adjustment to Illness (Cystic Fibrosis)  Goal: Optimal Coping  Outcome: Ongoing - Unchanged     Problem: Infection (Cystic Fibrosis)  Goal: Absence of Infection Signs/Symptoms  Outcome: Ongoing - Unchanged     Problem: Malabsorption (Cystic Fibrosis)  Goal: Optimal Bowel Elimination  Outcome: Ongoing - Unchanged     Problem: Oral Intake Inadequate (Cystic Fibrosis)  Goal: Optimal Nutrition Intake  Outcome: Ongoing - Unchanged     Problem: Respiratory Compromise (Cystic Fibrosis)  Goal: Effective Oxygenation and Ventilation  Outcome: Ongoing - Unchanged     Problem: Diabetes Comorbidity  Goal: Blood Glucose Level Within Desired Range  Outcome: Ongoing - Unchanged     Problem: Hypertension Comorbidity  Goal: Blood Pressure in Desired Range  Outcome: Ongoing - Unchanged

## 2018-08-17 NOTE — Unmapped (Signed)
No acute events. Pt received meds as ordered. No complaints of pain or discomfort. Pt received breathing treatments from RT. Activity encouraged. Ambulated in room,and up in chair for most of the shift. Vitals stable. Plan for Possible discharge home tomorrow with home infusion pending adequate/ tobramycin levels. Continue POC.   Problem: Adult Inpatient Plan of Care  Goal: Plan of Care Review  Outcome: Progressing  Goal: Patient-Specific Goal (Individualization)  Outcome: Progressing  Goal: Absence of Hospital-Acquired Illness or Injury  Outcome: Progressing  Goal: Optimal Comfort and Wellbeing  Outcome: Progressing  Goal: Readiness for Transition of Care  Outcome: Progressing  Goal: Rounds/Family Conference  Outcome: Progressing     Problem: Infection  Goal: Infection Symptom Resolution  Outcome: Progressing     Problem: Adjustment to Illness (Cystic Fibrosis)  Goal: Optimal Coping  Outcome: Progressing     Problem: Infection (Cystic Fibrosis)  Goal: Absence of Infection Signs/Symptoms  Outcome: Progressing     Problem: Malabsorption (Cystic Fibrosis)  Goal: Optimal Bowel Elimination  Outcome: Progressing     Problem: Oral Intake Inadequate (Cystic Fibrosis)  Goal: Optimal Nutrition Intake  Outcome: Progressing     Problem: Respiratory Compromise (Cystic Fibrosis)  Goal: Effective Oxygenation and Ventilation  Outcome: Progressing     Problem: Diabetes Comorbidity  Goal: Blood Glucose Level Within Desired Range  Outcome: Progressing     Problem: Hypertension Comorbidity  Goal: Blood Pressure in Desired Range  Outcome: Progressing

## 2018-08-17 NOTE — Unmapped (Signed)
Patient tolerated all respiratory medications well this shift. Mo adverse reactions noted. Patient very compliant with airway clearance via aerobeka. Scant amount of secretions (per patient). Currently maintaining adequate oxygent ation on room air. No further concerns at time. will continue to monitor.

## 2018-08-17 NOTE — Unmapped (Signed)
Pulmonology (Med G) Daily Progress Note    Interval History: NAEON. Patient feels that his breathing continues to improve. Denies any worsening cough, fever, chills, SOB.    Tobramyccin level was below goal on 1/4, increase dose per pharmacy to 250mg . Anticipated discharge was initially Monday 1/6, pending further recommendations by pharmacy and repeat tobramycin level for further dispo planning.     Assessment/Plan:    Principal Problem:    Cystic fibrosis with pulmonary exacerbation (CMS-HCC)  Active Problems:    Essential hypertension    Depressive disorder    Anxiety    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)    Pancreatic insufficiency due to cystic fibrosis (CMS-HCC)    History of Mycobacterium abscessus infection    Bronchiectasis (CMS-HCC)    Chronic deep vein thrombosis (DVT) of lower extremity (CMS-HCC)    Cystic fibrosis (CMS-HCC)    Obesity (BMI 30-39.9)    Restless leg syndrome     LOS: 4 days     Christian Young is a 33 year old man with cystic fibrosis [phone] and [phone] insertion) c/b bronchiectasis, pancreatic insufficiency, and diabetes, s/p RUL lobectomy (August 2018) for M abscessus infection, HTN, and MDD, and history of DVT, p/w increased sputum production and admitted for treatment of CF exacerbation.  ??  CF exacerbation w/ pulmonary manifestations w/ acute exacerbation of bronchiectasis: Increased cough and sputum production is concerning for a CF exacerbation. CXR w/o focal pneumonia. Negative for influenza, RPP. Cultures in the past have grown MDR smooth Pseudomonas (Intermediate: cipro and Zosyn, Suseptible to tobramycin). CF sputum cx this admission w 1+ PsA. Tobra level lower than goal on 1/4. Plan to d/c with home infusion.   - pending tobramycin level and redosing per pharmacy   - IV tobramycin 230 mg q12h (08/13/18 - ), dosing by Pharmacy  - IV Zosyn 4.5 g 16h (08/13/18 - )   - home Symdeko  - Pulmozyme 2.5 mg BID  - montelukast 10 mg qHS  - cetirizine 10 mg daily - Symbicort 2 puff BID  - Duonebs QID w/ RT  - HTS 7% QID w/ RT  - Aerobika and chest vest QID w/ RT  - gabapentin 400 mg TID  ??  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 Had been on clofazimine and azithromycin until last admission, then was discontinued. Plan to repeat AFB smears every 3 months (starting February 2020).  ??  CF-related Pancreatic Insufficiency:   - HPHC diet  - Creon 8 capsules w/ meals, 4 w/ snacks  - MVW 2 capsules daily  ??  CF-related DM: Last A1c 10.7% (August), 8.5 during this admission (12/31). Endo consulted for further management.  - glargine 70 units qHS  - increased lispro to 34 units TID AC  - Lispro 8U with snacks  - Endocrine following, appreciate recs   - SSI    Depression, anxiety: continue home vilazodone, lamotrigine, buspirone  HTN: continue lisinopril 10 mg daily and carvedilol 3.125 mg BID  History of DVT: continue rivaroxaban 20 mg daily  ??RLS: continue pramipexole 0.25 mg daily  ??  Code status: full code  HCDM: Rutherford Guys, wife  Disposition: Inpatient, discharge 1/6 with home infusion    Objective:    Vital signs in last 24 hours:  Temp:  [36.2 ??C-36.5 ??C] 36.2 ??C  Heart Rate:  [94-109] 109  Resp:  [18-22] 18  BP: (126-138)/(70-87) 138/80  MAP (mmHg):  [103-105] 103  SpO2:  [100 %] 100 %  Intake/Output last 3 shifts:  No intake/output data recorded.  Intake/Output this shift:  No intake/output data recorded.    Physical Exam:  General: well-appearing gentleman laying in bed in NAD  CV: RRR, no murmur  Pulm: CTAB from anterior fields, nl wob, speaks full sentences, on RA   Ext: 2+ pulses, no peripheral edema  Neuro: alert and appropriate

## 2018-08-18 LAB — MEAN PLATELET VOLUME: Lab: 8.2

## 2018-08-18 LAB — CBC W/ AUTO DIFF
BASOPHILS ABSOLUTE COUNT: 0.1 10*9/L (ref 0.0–0.1)
BASOPHILS RELATIVE PERCENT: 2 %
EOSINOPHILS ABSOLUTE COUNT: 0.4 10*9/L (ref 0.0–0.4)
EOSINOPHILS RELATIVE PERCENT: 6.3 %
HEMOGLOBIN: 12.1 g/dL — ABNORMAL LOW (ref 13.5–17.5)
LYMPHOCYTES ABSOLUTE COUNT: 1.5 10*9/L (ref 1.5–5.0)
MEAN CORPUSCULAR HEMOGLOBIN CONC: 31.5 g/dL (ref 31.0–37.0)
MEAN CORPUSCULAR HEMOGLOBIN: 26.6 pg (ref 26.0–34.0)
MEAN CORPUSCULAR VOLUME: 84.5 fL (ref 80.0–100.0)
MEAN PLATELET VOLUME: 8.2 fL (ref 7.0–10.0)
MONOCYTES ABSOLUTE COUNT: 0.5 10*9/L (ref 0.2–0.8)
MONOCYTES RELATIVE PERCENT: 8.5 %
NEUTROPHILS ABSOLUTE COUNT: 3.5 10*9/L (ref 2.0–7.5)
NEUTROPHILS RELATIVE PERCENT: 56.8 %
PLATELET COUNT: 333 10*9/L (ref 150–440)
RED BLOOD CELL COUNT: 4.55 10*12/L (ref 4.50–5.90)
RED CELL DISTRIBUTION WIDTH: 15.7 % — ABNORMAL HIGH (ref 12.0–15.0)
WBC ADJUSTED: 6.1 10*9/L (ref 4.5–11.0)

## 2018-08-18 LAB — BASIC METABOLIC PANEL
ANION GAP: 12 mmol/L (ref 7–15)
BLOOD UREA NITROGEN: 14 mg/dL (ref 7–21)
BUN / CREAT RATIO: 18
CHLORIDE: 100 mmol/L (ref 98–107)
CREATININE: 0.78 mg/dL (ref 0.70–1.30)
EGFR CKD-EPI AA MALE: >90 mL/min/{1.73_m2} (ref >=60)
EGFR CKD-EPI NON-AA MALE: >90 mL/min/{1.73_m2} (ref >=60)
GLUCOSE RANDOM: 319 mg/dL — ABNORMAL HIGH (ref 70–179)
POTASSIUM: 4.3 mmol/L (ref 3.5–5.0)
SODIUM: 134 mmol/L — ABNORMAL LOW (ref 135–145)

## 2018-08-18 LAB — TOBRAMYCIN RANDOM: Tobramycin:MCnc:Pt:Ser/Plas:Qn:: 12

## 2018-08-18 LAB — BURR CELLS

## 2018-08-18 LAB — EGFR CKD-EPI NON-AA MALE: Lab: >90

## 2018-08-18 MED ORDER — HYDROCODONE 5 MG-ACETAMINOPHEN 325 MG TABLET
ORAL_TABLET | Freq: Four times a day (QID) | ORAL | 0 refills | 0 days | Status: CP | PRN
Start: 2018-08-18 — End: 2018-08-23

## 2018-08-18 MED ORDER — SODIUM CHLORIDE 7 % FOR NEBULIZATION
Freq: Two times a day (BID) | RESPIRATORY_TRACT | 3 refills | 0 days | Status: CP
Start: 2018-08-18 — End: 2019-08-18

## 2018-08-18 MED ORDER — OMEPRAZOLE 20 MG CAPSULE,DELAYED RELEASE
ORAL_CAPSULE | Freq: Every day | ORAL | 0 refills | 0 days | Status: CP
Start: 2018-08-18 — End: ?

## 2018-08-18 MED ORDER — PIPERACILLIN-TAZOBACTAM 4.5 GRAM/100 ML DEXTROSE(ISO-OSM) IV PIGGYBACK
Freq: Four times a day (QID) | INTRAVENOUS | 0 refills | 0 days | Status: CP
Start: 2018-08-18 — End: 2018-10-01

## 2018-08-18 MED ORDER — TOBRAMYCIN IVPB IN 100 ML (STANDARD DOSING) (OVF)
Freq: Two times a day (BID) | INTRAVENOUS | 0 refills | 0 days | Status: CP
Start: 2018-08-18 — End: 2018-10-01

## 2018-08-18 MED ORDER — INSULIN GLARGINE (U-100) 100 UNIT/ML (3 ML) SUBCUTANEOUS PEN
Freq: Every day | SUBCUTANEOUS | 0 refills | 0.00000 days | Status: CP
Start: 2018-08-18 — End: ?

## 2018-08-18 NOTE — Unmapped (Signed)
Endocrinology Consult Note    Requesting Attending Physician :  Zannie Cove, MD  Service Requesting Consult : Pulmonology (MDG)  Primary Care Provider: [facility]  Outpatient Endocrinologist: Dr. 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 is a 33 y.o. male with a h/o CF, CFRD, admitted for [facility], who is seen in consultation at the request of Zannie Cove, MD for evaluation of CFRD    CFRD- Well controlled at home and when he gets his insulin as scheduled here.    - continue Lantus 70U QHS  -  Lispro 34U TID AC main meals. (hold if NPO)  - lispro 8U snack dose PRN  - continue lispro sliding scale insulin.    Recommendations were discussed with primary team.    Elpidio Galea, MD  PGY4 Endocrinology Fellow      Please page Endocrine consult pager if questions:  [phone]  ---------------------------------    History of Present Illness: :  Reason for Consult:  CFRD    [name] is a 33 y.o. male with a h/o CF, CFRD, admitted for [facility], who is seen in consultation at the request of Zannie Cove, MD for evaluation of CFRD    Patient is admitted for cf exacerbation and getting IV antibiotics. No steroids.     Follows with Dr. Monia Sabal in clinic. Home doses are 60U Lantus QHS, Lispro 30U AC, 8U snack, resistant correction.   Lab Results   Component Value Date    A1C 8.5 (H) 08/12/2018     In hospital he has been on Lantus 70U QHS, Lispro 30U AC, standard sliding scale. Glucose is well controlled.    He tells me he usually is taking 70U Lantus and 32U lispro with meals. He has around 8U with snacks. He has very rare hypos, to the 50s.     Interval Hx- No insulin yesterday at dinner. High this morning.    ROS:  See HPI. Ten systems reviewed and otherwise negative.    Allergies:  Cayston [aztreonam lysine]; Cefepime; Other; Slo-bid 100; Banana; and Tobramycin    All Medications:   Current Facility-Administered Medications   Medication Dose Route Frequency Provider Last Rate Last Dose   ??? acetaminophen (TYLENOL) tablet 650 mg  650 mg Oral Q6H PRN Tonye Royalty, MD        Or   ??? oxyCODONE (ROXICODONE) immediate release tablet 5 mg  5 mg Oral Q6H PRN Tonye Royalty, MD   5 mg at 08/18/18 0556   ??? albuterol 2.5 mg /3 mL (0.083 %) nebulizer solution 2.5 mg  2.5 mg Nebulization 4x Daily (RT) Lorenda Peck, MD   2.5 mg at 08/17/18 2107   ??? budesonide-formoterol (SYMBICORT) 160-4.5 mcg/actuation inhaler 2 puff  2 puff Inhalation BID Lorenda Peck, MD   2 puff at 08/17/18 2110   ??? busPIRone (BUSPAR) tablet 30 mg  30 mg Oral BID  Lorenda Peck, MD   30 mg at 08/17/18 2020   ??? carvedilol (COREG) tablet 3.125 mg  3.125 mg Oral BID Lorenda Peck, MD   3.125 mg at 08/17/18 2020   ??? cetirizine (ZyrTEC) tablet 10 mg  10 mg Oral Daily Lorenda Peck, MD   10 mg at 08/17/18 0840   ??? dextrose (D10W) 10% bolus 125 mL  12.5 g Intravenous Q30 Min PRN Lorenda Peck, MD       ??? dornase alfa (PULMOZYME) 1 mg/mL nebulizer solution 2.5 mg  2.5 mg Inhalation BID Lorenda Peck, MD   2.5 mg at 08/17/18 2107   ??? gabapentin (NEURONTIN) capsule 400 mg  400 mg Oral TID Lorenda Peck, MD   400 mg at 08/17/18 2020   ??? insulin glargine (LANTUS) injection 70 Units  70 Units Subcutaneous Nightly Lorenda Peck, MD   70 Units at 08/17/18 2146   ??? insulin lispro (HumaLOG) injection 0-12 Units  0-12 Units Subcutaneous ACHS Lorenda Peck, MD   4 Units at 08/17/18 2147   ??? insulin lispro (HumaLOG) injection 34 Units  34 Units Subcutaneous TID AC Lujean Amel, MD   34 Units at 08/17/18 1240   ??? insulin lispro (HumaLOG) injection 8 Units  8 Units Subcutaneous With snacks Edson Snowball, MD       ??? lamoTRIgine (LaMICtal) tablet 100 mg  100 mg Oral Daily Lorenda Peck, MD   100 mg at 08/17/18 0840   ??? lisinopril (PRINIVIL,ZESTRIL) tablet 10 mg  10 mg Oral Daily Lorenda Peck, MD   10 mg at 08/17/18 0841   ??? melatonin tablet 9 mg  9 mg Oral Nightly Lorenda Peck, MD   9 mg at 08/17/18 2019   ??? montelukast (SINGULAIR) tablet 10 mg  10 mg Oral Nightly Lorenda Peck, MD   10 mg at 08/17/18 2019   ??? MVW Complete (pediatric multivit 61-D3-vit K) 1,500-800 unit-mcg 2 capsule  2 capsule Oral Daily Lorenda Peck, MD   2 capsule at 08/17/18 0840   ??? ondansetron (ZOFRAN-ODT) disintegrating tablet 4 mg  4 mg Oral Q12H PRN Salome Arnt, MD   4 mg at 08/15/18 0809   ??? pancrelipase (Lip-Prot-Amyl) (CREON) 24,000-76,000 -120,000 unit delayed release capsule 288,000 units of lipase  12 capsule Oral 3xd Meals Lorenda Peck, MD   288,000 units of lipase at 08/17/18 2018   ??? pancrelipase (Lip-Prot-Amyl) (CREON) 24,000-76,000 -120,000 unit delayed release capsule 96,000 units of lipase  4 capsule Oral With snacks Lorenda Peck, MD       ??? pantoprazole (PROTONIX) EC tablet 40 mg  40 mg Oral Daily Lorenda Peck, MD   40 mg at 08/17/18 0840   ??? piperacillin-tazobactam (ZOSYN) IVPB (premix) 4.5 g  4.5 g Intravenous Q6H Lorenda Peck, MD 200 mL/hr at 08/18/18 0410 4.5 g at 08/18/18 0410   ??? pramipexole (MIRAPEX) tablet 0.25 mg  0.25 mg Oral Daily Lorenda Peck, MD   0.25 mg at 08/17/18 0840   ??? rivaroxaban (XARELTO) tablet 20 mg  20 mg Oral Daily Lorenda Peck, MD   20 mg at 08/17/18 1739   ??? sodium chloride 7% nebulizer solution 4 mL  4 mL Nebulization 4x Daily (RT) Lorenda Peck, MD   4 mL at 08/17/18 2107   ??? tezacaftor 100mg /ivacaftor 150mg  and ivacaftor 150mg  (SYMDEKO) tablets * Patient supplied *  1 tablet Oral BID Lorenda Peck, MD  1 tablet at 08/17/18 2019   ??? tobramycin (NEBCIN) 250 mg in sodium chloride (NS) 0.9 % 100 mL IVPB  250 mg Intravenous Q12H Zannie Cove, MD 232.5 mL/hr at 08/17/18 2344 250 mg at 08/17/18 2344   ??? traZODone (DESYREL) tablet 100 mg  100 mg Oral Nightly PRN Lorenda Peck, MD   100 mg at 08/17/18 2021       Past Medical History:    Medical History:  Past Medical History:   Diagnosis Date   ??? Anxiety    ??? Chronic pain disorder    ??? Cystic fibrosis (CMS-HCC)    ??? Depression    ??? Hypertension    ??? Nonproductive cough 04/05/2018       Surgical History:  Past Surgical History:   Procedure Laterality Date   ??? PR REMOVAL OF LUNG,LOBECTOMY Right 03/29/2017    Procedure: REMOVAL OF LUNG, OTHER THAN PNEUMONECTOMY; SINGLE LOBE (LOBECTOMY);  Surgeon: Cherie Dark, MD;  Location: MAIN OR [facility];  Service: Thoracic       Social History:  Social History     Socioeconomic History   ??? Marital status: Single     Spouse name: Not on file   ??? Number of children: Not on file   ??? Years of education: Not on file   ??? Highest education level: Not on file   Occupational History   ??? Not on file   Social Needs   ??? Financial resource strain: Not hard at all   ??? Food insecurity:     Worry: Patient refused     Inability: Patient refused   ??? Transportation needs:     Medical: No     Non-medical: No   Tobacco Use   ??? Smoking status: Never Smoker   ??? Smokeless tobacco: Never Used   Substance and Sexual Activity   ??? Alcohol use: Yes     Alcohol/week: 3.0 standard drinks     Types: 3 Glasses of wine per week     Comment: 3 bottles of wine 2 days ago   ??? Drug use: No   ??? Sexual activity: Not Currently   Lifestyle   ??? Physical activity:     Days per week: 0 days     Minutes per session: 0 min   ??? Stress: Only a little   Relationships   ??? Social connections:     Talks on phone: Patient refused     Gets together: Patient refused     Attends religious service: Patient refused     Active member of club or organization: Patient refused Attends meetings of clubs or organizations: Patient refused     Relationship status: Patient refused   Other Topics Concern   ??? Not on file   Social History Narrative   ??? Not on file       Family History:  Family History   Problem Relation Age of Onset   ??? Bipolar disorder Mother    ??? Depression Mother          Objective: :    No data found.    No intake/output data recorded.    Physical Exam:     General: Alert, well-appearing, and in no distress.  Eyes: Anicteric sclera, conjunctiva clear.  ENT:  MMM  Lungs: non labored  Musculoskeletal: No clubbing and no synovitis.  Ext: none edema  Skin: No rashes or lesions.  Neuro: The patient was alert and appropriate for conversation.   Psych- pleasant  Test Results      CBC -   Results in Past 2 Days  Result Component Current Result   WBC 6.1 (08/18/2018)   RBC 4.55 (08/18/2018)   HCT 38.5 (L) (08/18/2018)   HGB 12.1 (L) (08/18/2018)   Platelet 333 (08/18/2018)   RDW 15.7 (H) (08/18/2018)   MCV 84.5 (08/18/2018)   MCH 26.6 (08/18/2018)   MCHC 31.5 (08/18/2018)   MPV 8.2 (08/18/2018)     BMP -   Results in Past 2 Days  Result Component Current Result   Sodium 134 (L) (08/18/2018)   Potassium 4.3 (08/18/2018)   Chloride 100 (08/18/2018)   CO2 22.0 (08/18/2018)   BUN 14 (08/18/2018)   Creatinine 0.78 (08/18/2018)   Glucose 319 (H) (08/18/2018)       Thyroid -  Lab Results   Component Value Date    TSH 2.120 11/18/2016

## 2018-08-18 NOTE — Unmapped (Signed)
Pharmacy Note:  Transition to Outpatient with Home Infusion  Christian Young 33 y.o. male    Admission: 08/12/2018      Disciplines requested: Nursing and Home IV Antibiotics    Physician to follow patient's care (the person listed here will be responsible for signing ongoing orders): Maia Plan    Start of Care Date: Aug 18, 2018  Estimate completion of therapy: Sep 03, 2018   Home health/infusion please reach out to phone contact (below) prior to Jan 15th     --- reach out to clinic to see if will complete 2 weeks vs 3 weeks    **FAX RESULTS TO: **  CF Patient: [phone]    *PHONE CONTACT*:  CF Patient: [phone]    ---------------------------------------------------------------------------------------------------------------------------------------------------------------------------  IV ANTIBIOTIC ORDERS:  -Tobramycin 240mg  IV every 12 hours (Conventional dosing, infuse over 30 minutes)  -Piperacillin-tazobactam (Zosyn) 4.5g IV every 6 hours    Inpatient Start Date of Antibiotics: Aug 13, 2018    **DO NOT DRAW DRUG LEVELS FROM PORT**    LAB MONITORING ORDERS:  Start labs on  Jan 9th, 2020      Collect on MONDAYS of each week:  CBC + diff  Hepatic Function Panel (LFTs)  Magnesium  Basic Metabolic Panel  Tobramycin Trough Level (collect prior to START of infusion)    Collect on THURSDAYS of each week (only for aminoglycosides)  CBC + diff  Magnesium  Basic Metabolic Panel  Tobramycin Trough Level (collect prior to START of infusion)    TARGET DRUG LEVELS:  -Tobramycin (Conventional Dosing): Target end of infusion peak: 10-14 mcg/mL, trough < 1 mcg/mL ----- will tolerate up to 26mcg/mL provided true trough < 57mcg/mL    Regards,    Henrry Feil, PharmD  MDG Team Pharmacist for August 18, 2018 11:32 AM

## 2018-08-18 NOTE — Unmapped (Signed)
No acute events. Pt received meds as ordered. No complaints of pain. Independent with ADL's. Vitals stable on room air. Plan for Pt to be discharged home with home infusion this afternoon.   Problem: Adult Inpatient Plan of Care  Goal: Plan of Care Review  Outcome: Progressing  Goal: Patient-Specific Goal (Individualization)  Outcome: Progressing  Goal: Absence of Hospital-Acquired Illness or Injury  Outcome: Progressing  Goal: Optimal Comfort and Wellbeing  Outcome: Progressing  Goal: Readiness for Transition of Care  Outcome: Progressing  Goal: Rounds/Family Conference  Outcome: Progressing     Problem: Infection  Goal: Infection Symptom Resolution  Outcome: Progressing     Problem: Adjustment to Illness (Cystic Fibrosis)  Goal: Optimal Coping  Outcome: Progressing     Problem: Infection (Cystic Fibrosis)  Goal: Absence of Infection Signs/Symptoms  Outcome: Progressing     Problem: Malabsorption (Cystic Fibrosis)  Goal: Optimal Bowel Elimination  Outcome: Progressing     Problem: Oral Intake Inadequate (Cystic Fibrosis)  Goal: Optimal Nutrition Intake  Outcome: Progressing     Problem: Respiratory Compromise (Cystic Fibrosis)  Goal: Effective Oxygenation and Ventilation  Outcome: Progressing     Problem: Diabetes Comorbidity  Goal: Blood Glucose Level Within Desired Range  Outcome: Progressing     Problem: Hypertension Comorbidity  Goal: Blood Pressure in Desired Range  Outcome: Progressing

## 2018-08-18 NOTE — Unmapped (Signed)
Problem: Respiratory Compromise (Cystic Fibrosis)  Goal: Effective Oxygenation and Ventilation  Outcome: Progressing

## 2018-08-18 NOTE — Unmapped (Signed)
Problem: Adult Inpatient Plan of Care  Goal: Plan of Care Review  Outcome: Progressing  Goal: Patient-Specific Goal (Individualization)  Outcome: Progressing  Goal: Absence of Hospital-Acquired Illness or Injury  Outcome: Progressing  Goal: Optimal Comfort and Wellbeing  Outcome: Progressing  Goal: Readiness for Transition of Care  Outcome: Progressing  Goal: Rounds/Family Conference  Outcome: Progressing     Problem: Infection  Goal: Infection Symptom Resolution  Outcome: Progressing     Problem: Infection (Cystic Fibrosis)  Goal: Absence of Infection Signs/Symptoms  Outcome: Progressing

## 2018-08-18 NOTE — Unmapped (Signed)
Physician Discharge Summary [facility]  [facility]  [address]  [place] [address]  Dept: [phone]  Loc: [phone]     Identifying Information:   Christian Young  [birth year]  [id number]    Primary Care Physician: [facility]   Code Status: Full Code    Admit Date: 08/12/2018    Discharge Date: 08/18/2018     Discharge To: Home    Discharge Service: MDG - Pulmonary Admit     Discharge Attending Physician: Truett Mainland, MD    Discharge Diagnoses:  Principal Problem:    Cystic fibrosis with pulmonary exacerbation (CMS-HCC) POA: Yes  Active Problems:    Essential hypertension POA: Yes    Depressive disorder POA: Yes    Anxiety POA: Yes    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) POA: Yes    Pancreatic insufficiency due to cystic fibrosis (CMS-HCC) POA: Yes    History of Mycobacterium abscessus infection POA: Yes    Bronchiectasis (CMS-HCC) POA: Yes    Chronic deep vein thrombosis (DVT) of lower extremity (CMS-HCC) POA: Yes    Cystic fibrosis (CMS-HCC) POA: Yes    Obesity (BMI 30-39.9) POA: Yes    Restless leg syndrome POA: Yes  Resolved Problems:    * No resolved hospital problems. [facility] Course:   Outpatient follow up:   [ ]  DM management - endocrinology appt 08/26/2018   [ ]  plan on total 2 week abx duration of tobramycin and zosyn (1/1 - 08/26/2018); He will need to be reassessed in pulmonology clinic (scheduled 08/28/2018 for evaluation for a longer abx duration  [ ]  outpatient PFTs after abx course, scheduled 08/28/2018   [ ]  AFB smear pending for 09/2018     CF exacerbation w/ pulmonary manifestations w/ acute exacerbation of bronchiectasis: Increased cough and sputum production is concerning for a CF exacerbation. Vitals and labs stable, with the exception of tachycardia (which is normal for him). CXR w/o focal pneumonia. Negative for influenza, RPP. Cultures in the past have grown MDR smooth Pseudomonas (Intermediate: cipro and Zosyn, Suseptible to tobramycin). CF sputum culture this admission with 1+ PsA.  Stable and improving sxs throughout hospitalization. Continued on home CF regimen including pulmozyme, mnotelukast, cetirizine, symbicort, duonebs, HTS 7%. Continued aerobika and chest vest with RT. Discharged with home infusion with IV tobramycin 240 mg q12h (08/13/18 - ), IV Zosyn 4.5 g 16h (08/13/18 - ). Planning for a 2 week course from start date (08/13/2018-08/26/2018).     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 Had been on clofazimine and azithromycin until last admission, then was discontinued. Plan to repeat AFB smears every 3 months (starting February 2020). However, he will need to be reassessed in pulmonology clinic for evaluation for a longer abx duration.  He currently has an appointment on 1/23, pending moving the appointment to an earlier date.     CF-related Pancreatic Insufficiency: continued HPHC diet with Creon 8 capsules w/ meals, 4 w/ snacks. MVW 2 capsules daily  ??  CF-related DM: Last A1c 10.7% (August), repeat during this admission was 8.5 (12/31). Endo consulted for further management, glucose was controlled with glargine 70 units qHS, lispro 30 units TID AC and SSI. On discharge, endocrinology recommended to continue home regimen with 70 lantus at night and lispro 30 units TID AC. He has a follow up appointment with Endocrinology on 08/26/2018.   ??  Depression, anxiety: continue home  vilazodone, lamotrigine, buspirone  HTN: continue lisinopril 10 mg daily and carvedilol 3.125 mg BID  History of DVT: continue rivaroxaban 20 mg daily  ??RLS: continue pramipexole 0.25 mg daily    Procedures:  None  No admission procedures for hospital encounter.  ______________________________________________________________________  Discharge Medications:     Your Medication List      STOP taking these medications    ciprofloxacin HCl 500 MG tablet  Commonly known as:  CIPRO        START taking these medications    piperacillin-tazobactam 4.5 gram/100 mL Pgbk  Commonly known as:  ZOSYN  Infuse 100 mL (4.5 g total) into a venous catheter Every six (6) hours for 16 days.     tobramycin 240 mg, OVERFILL 10 mL in sodium chloride 0.9 % 100 mL IVPB  Infuse 240 mg into a venous catheter every twelve (12) hours for 16 days. Same time every day  (example 10a and 10p  OR 11a and 11p  OR 12n and 83m)        CHANGE how you take these medications    insulin glargine 100 unit/mL (3 mL) injection pen  Commonly known as:  BASAGLAR, LANTUS  Inject 0.7 mL (70 Units total) under the skin daily.  What changed:  how much to take     sodium chloride 7% 7 % Nebu  Inhale 4 mL by nebulization Two (2) times a day. Increase to 4 times while taking antibiotics  What changed:  additional instructions        CONTINUE taking these medications    albuterol 90 mcg/actuation inhaler  Commonly known as:  PROVENTIL HFA;VENTOLIN HFA  Inhale 2 puffs every six (6) hours as needed.     albuterol 2.5 mg/0.5 mL nebulizer solution  Inhale 0.5 mL (2.5 mg total) by nebulization every six (6) hours as needed for wheezing.     blood sugar diagnostic Strp  Dispense 150 blood glucose test strips, ok to sub any brand preferred by insurance/patient, use up to 5x/day, dx E 08.9     blood-glucose meter Misc  Dispense meter that is preferred by patient's insurance company     blood-glucose meter,continuous Misc  Commonly known as:  DEXCOM G6 RECEIVER  Use as directed     blood-glucose sensor Devi  Commonly known as:  DEXCOM G6 SENSOR  Use sq as directed every 14 days     blood-glucose transmitter Devi  Commonly known as:  DEXCOM G6 TRANSMITTER  Use as directed     budesonide-formoterol 160-4.5 mcg/actuation inhaler  Commonly known as:  SYMBICORT  Inhale 2 puffs Two (2) times a day.     busPIRone 30 MG tablet  Commonly known as:  BUSPAR  Take 1 tablet (30 mg total) by mouth Two (2) times a day.     carvedilol 3.125 MG tablet  Commonly known as:  COREG  Take 1 tablet (3.125 mg total) by mouth Two (2) times a day.     cetirizine 10 MG tablet  Commonly known as:  ZyrTEC  TAKE 1 TABLET (10 MG TOTAL) BY MOUTH DAILY.     diclofenac sodium 1 % gel  Commonly known as:  VOLTAREN  Apply 2 g topically Four (4) times a day.     dornase alfa 1 mg/mL nebulizer solution  Commonly known as:  PULMOZYME  Inhale 2.5 mg Two (2) times a day.     glucagon (human recombinant) 1 mg/ml injection  Commonly known as:  glucagon  Inject  1 mL (1 mg total) under the skin once as needed (severe hypoglycemia). Follow package directions for low blood sugar.     HORIZANT 600 mg Tber  Generic drug:  gabapentin enacarbil  Take 600 mg by mouth daily with evening meal. Two tablets (total 1,200 mg) at night.     HYDROcodone-acetaminophen 5-325 mg per tablet  Commonly known as:  NORCO  Take 1 tablet by mouth every six (6) hours as needed for pain for up to 5 days.     insulin ASPART 100 unit/mL (3 mL) injection pen  Commonly known as:  NovoLOG FLEXPEN  Inject  30 units prior to each meal three times per day or as per MD instructions     Lacto-Bif-Sac-Bacil-Strep-bact 40 billion cell -15 mg Cpdr  Take 1 capsule by mouth daily.     lamoTRIgine 100 MG tablet  Commonly known as:  LaMICtal  Take 100 mg by mouth daily.     lancets Misc  Dispense 150 lancets, ok to sub any brand preferred by insurance/patient, use up to 5x/day, dx E08.9     LC PLUS Misc  Generic drug:  nebulizers  Use as directed with inhaled medications     lipase-protease-amylase 36,000-114,000- 180,000 unit Cpdr  Commonly known as:  CREON  TAKE 7-8 CAPSULES BY MOUTH WITH MEALS AND 5-6 CAPSULES WITH SNACKS     lisinopril 10 MG tablet  Commonly known as:  PRINIVIL,ZESTRIL  TAKE 1 TABLET BY MOUTH EVERY DAY     melatonin 10 mg Tab  Take 10 mg by mouth nightly.     montelukast 10 mg tablet  Commonly known as:  SINGULAIR  Take 1 tablet (10 mg total) by mouth nightly.     omeprazole 20 MG capsule  Commonly known as:  PriLOSEC  Take 1 capsule (20 mg total) by mouth daily.     pediatric multivit 61-D3-vit K 5,000-800 unit-mcg Cap  Take 2 capsules by mouth daily.     pen needle, diabetic 32 gauge x 5/32 Ndle  Commonly known as:  BD ULTRA-FINE NANO PEN NEEDLE  ok to sub any brand or size needle preferred by insurance/patient, use up to 4x/day, dx E 08.9     pramipexole 0.125 MG tablet  Commonly known as:  MIRAPEX  Take 0.25 mg by mouth daily.     rivaroxaban 20 mg tablet  Commonly known as:  XARELTO  Take 1 tablet (20 mg total) by mouth daily with evening meal.     SYMDEKO 100-150 mg (d)/ 150 mg (n) tablet  Generic drug:  tezacaftor-ivacaftor  TAKE BY MOUTH AS DIRECTED ON PACKAGE LABELING     traZODone 100 MG tablet  Commonly known as:  DESYREL  TAKE 1 TABLET BY MOUTH NIGHTLY     vilazodone 40 mg Tab  Commonly known as:  VIIBRYD  Take 1 tablet (40 mg total) by mouth daily.            Allergies:  [place] [aztreonam lysine]; Cefepime; Other; Slo-bid 100; Banana; and Tobramycin  ______________________________________________________________________  Pending Test Results (if blank, then none):   Order Current Status    CF Sputum/ CF Sinus Culture Preliminary result          Most Recent Labs:  All lab results last 24 hours -   Recent Results (from the past 24 hour(s))   POCT Glucose    Collection Time: 08/17/18  6:26 PM   Result Value Ref Range    Glucose, POC 113 65 - 179  mg/dL   POCT Glucose    Collection Time: 08/17/18  9:32 PM   Result Value Ref Range    Glucose, POC 223 (H) 65 - 179 mg/dL   Tobramycin Level, Random    Collection Time: 08/17/18 10:02 PM   Result Value Ref Range    Tobramycin Rm 0.7 Undefined ug/mL   Tobramycin Level, Random    Collection Time: 08/18/18  1:10 AM   Result Value Ref Range    Tobramycin Rm 12.0 Undefined ug/mL   Basic metabolic panel    Collection Time: 08/18/18  5:43 AM   Result Value Ref Range    Sodium 134 (L) 135 - 145 mmol/L    Potassium 4.3 3.5 - 5.0 mmol/L    Chloride 100 98 - 107 mmol/L    CO2 22.0 22.0 - 30.0 mmol/L    Anion Gap 12 7 - 15 mmol/L    BUN 14 7 - 21 mg/dL    Creatinine 1.61 0.96 - 1.30 mg/dL    BUN/Creatinine Ratio 18     EGFR CKD-EPI Non-African American, Male >90 >=60 mL/min/1.42m2    EGFR CKD-EPI African American, Male >90 >=60 mL/min/1.54m2    Glucose 319 (H) 70 - 179 mg/dL    Calcium 8.5 8.5 - 04.5 mg/dL   CBC w/ Differential    Collection Time: 08/18/18  5:43 AM   Result Value Ref Range    WBC 6.1 4.5 - 11.0 10*9/L    RBC 4.55 4.50 - 5.90 10*12/L    HGB 12.1 (L) 13.5 - 17.5 g/dL    HCT 40.9 (L) 81.1 - 53.0 %    MCV 84.5 80.0 - 100.0 fL    MCH 26.6 26.0 - 34.0 pg    MCHC 31.5 31.0 - 37.0 g/dL    RDW 91.4 (H) 78.2 - 15.0 %    MPV 8.2 7.0 - 10.0 fL    Platelet 333 150 - 440 10*9/L    Neutrophils % 56.8 %    Lymphocytes % 24.3 %    Monocytes % 8.5 %    Eosinophils % 6.3 %    Basophils % 2.0 %    Absolute Neutrophils 3.5 2.0 - 7.5 10*9/L    Absolute Lymphocytes 1.5 1.5 - 5.0 10*9/L    Absolute Monocytes 0.5 0.2 - 0.8 10*9/L    Absolute Eosinophils 0.4 0.0 - 0.4 10*9/L    Absolute Basophils 0.1 0.0 - 0.1 10*9/L    Large Unstained Cells 2 0 - 4 %    Microcytosis Slight (A) Not Present    Hypochromasia Marked (A) Not Present   Morphology Review    Collection Time: 08/18/18  5:43 AM   Result Value Ref Range    Smear Review Comments See Comment (A) Undefined    Burr Cells Present (A) Not Present   POCT Glucose    Collection Time: 08/18/18  7:33 AM   Result Value Ref Range    Glucose, POC 280 (H) 65 - 179 mg/dL   POCT Glucose    Collection Time: 08/18/18 11:19 AM   Result Value Ref Range    Glucose, POC 182 (H) 65 - 179 mg/dL       Relevant Studies/Radiology (if blank, then none):  Xr Chest 2 Views    Result Date: 08/12/2018  EXAM: XR CHEST 2 VIEWS DATE: 08/12/2018 8:44 PM ACCESSION: [id number] UN DICTATED: 08/12/2018 8:44 PM INTERPRETATION LOCATION: [facility] CLINICAL INDICATION: 33 years old Male with CYSTIC FIBROSIS  COMPARISON: 06/22/2018 chest radiograph, chest CT  06/24/2018 TECHNIQUE: PA and Lateral Chest Radiographs. FINDINGS: Right chest Port-A-Cath tip in the right atrium. Redemonstrated right apical scarring due to prior upper lobe resection. No new focal consolidation. No pleural effusion or pneumothorax. Stable cardiomediastinal silhouette.     No acute airspace disease.    ______________________________________________________________________  Discharge Instructions:       Appointments which have been scheduled for you    Aug 26, 2018 10:45 AM EST  (Arrive by 10:30 AM)  DIABETIC EDUCATION with Jake Shark, RD/LDN  [facility] DIABETES AND ENDOCRINOLOGY [place] [facility] REGION) [address]  [place] [address]  [phone]      Aug 28, 2018 11:15 AM EST  RETURN PFT 15 with [place] PFT 1  [facility] PULMONARY SPECIALTY FUNCT [place] [facility] REGION) [address]  [address]  [place] [address]  [phone]      Aug 28, 2018 11:30 AM EST  (Arrive by 11:15 AM)  RETURN CYSTIC FIBROSIS with Verlin Fester, PA  [facility] PULMONARY SPECIALTY CL [place] [facility] REGION) [address]  [address]  [place] [address]  [phone]      Sep 04, 2018  1:15 PM EST  (Arrive by 1:00 PM)  RETURN PFT 15 with [facility] PFT 2  [facility] PULMONARY SPECIALTY FUNCT [place] [facility] REGION) [address]  [address]  [place] [address]  [phone]      Sep 04, 2018  1:30 PM EST  (Arrive by 1:15 PM)  RETURN CYSTIC FIBROSIS with Verlin Fester, PA  [facility] PULMONARY SPECIALTY CL [place] [facility] REGION) [address]  [address]  [place] [address]  [phone]           ______________________________________________________________________  Discharge Day Services:  BP 129/85  - Pulse 89  - Temp 36.4 ??C (Oral)  - Resp 16  - Ht 182.9 cm (6')  - Wt (!) 120.7 kg (266 lb 3.2 oz)  - SpO2 97%  - BMI 36.10 kg/m??   Pt seen on the day of discharge and determined appropriate for discharge.    Condition at Discharge: stable    Length of Discharge: I spent greater than 30 mins in the discharge of this patient.

## 2018-08-18 NOTE — Unmapped (Signed)
Aminoglycoside Therapeutic Monitoring Pharmacy Note    Christian Young is a 33 y.o. male     CF Excerbation    Day 1 = Aug 13, 2018  Day 14 till Jan 15th  Day 21 till Jan 22        Prior Dosing Information: previous regimens     Tobra 220 - 250mg  q12h (30 min infusion)   Hx of drug accumulation (trough >23mcg/mL) on q12h interval   HX of ototoxicity w/peaks >5mcg/mL   Hx of bun/scr, mag wasting   Non-typical body habitus for CF -- HT: 6'  WT: ~120kg    Goals:   CF-Conventional Tobramycin Dosing  Therapeutic Drug Levels  ?? Trough level: tobra trough < 48mcg/mL  ?? Peak level: tobra peak at END of infusion ~ 10-35mcg/mL   ?? If need to convert to q24h dosing interval peaks up to ~76mcg/mL tolerated    Additional Clinical Monitoring/Outcomes  Renal function (bun/scr and K/Mag) measure at least every 1-2 days  volume status (intake and output)    Results:   ??     Wt Readings from Last 1 Encounters:   08/14/18 (!) 120.7 kg (266 lb 3.2 oz)     Lab Results   Component Value Date    CREATININE 0.78 08/18/2018       Pharmacokinetic Considerations and Significant Drug Interactions:  ? Adult (calculated on 08/18/2018): Vd = xx L, ke = 0.325 hr-1 T1/2 2.1h  ? Concurrent nephrotoxic meds: Zosyn    Estimated peak at END of infusion ~ 16-56mcg/mL  Estimated trough at END of infusion ~ < 0.24mcg/mL    Peak is slightly above goal -- this may have been influenced by peak level being drawn via central line -- since going home to complete course will conservatively decrease dose.    Assessment/Plan:  Recommended Dose  ? Change current regimen to tobramycin 240mg  q12h ( infusion)  ? Avoid the use of NSAIDs while on IV tobramycin    Follow-up  ? Level due: Check peak level prior to discharge   ? Check general labs + tobra levels  in 2-3 days as outpatient    ? A pharmacist will continue to monitor and order levels as appropriate    Please page service pharmacist with questions/clarifications.    Drue Flirt, PharmD

## 2018-08-18 NOTE — Unmapped (Signed)
All inhaled meds and airway clearance done today without incident. No resp distress noted. On room air. Continue to monitor.

## 2018-08-20 NOTE — Unmapped (Signed)
[facility] Refill Coordination Note    Specialty Medication(s) to be Shipped:   CF/Pulmonary: -SYMDEKO (tezacaftor 100mg /ivacaftor 150mg  and ivacaftor 150mg ) tablets    Other medication(s) to be shipped: N/A     Christian Young, DOB: [birth year]  Phone: [phone] (home)       All above HIPAA information was verified with patient.     Completed refill call assessment today to schedule patient's medication shipment from the [facility] [phone]).       Specialty medication(s) and dose(s) confirmed: Regimen is correct and unchanged.   Changes to medications: Christian Young reports no changes reported at this time.  Changes to insurance: No  Questions for the pharmacist: No    The patient will receive a drug information handout for each medication shipped and additional FDA Medication Guides as required.      DISEASE/MEDICATION-SPECIFIC INFORMATION        For CF patients: CF [employer] Active? Yes    ADHERENCE     Medication Adherence            Support network for adherence:  family member                  MEDICARE PART B DOCUMENTATION     symdeko: Patient has 2 weeks on hand.    SHIPPING     Shipping address confirmed in Epic.     Delivery Scheduled: Yes, Expected medication delivery date: 08/26/2018 via UPS or courier.     Medication will be delivered via UPS to the home address in Epic [place].    Christian Young Perlie Gold   [facility] Specialty Technician

## 2018-08-25 MED FILL — SYMDEKO 100 MG-150 MG (DAY)/150 MG (NIGHT) TABLETS: 28 days supply | Qty: 56 | Fill #3

## 2018-08-25 MED FILL — SYMDEKO 100 MG-150 MG (DAY)/150 MG (NIGHT) TABLETS: 28 days supply | Qty: 56 | Fill #3 | Status: AC

## 2018-08-25 NOTE — Unmapped (Signed)
Outpatient Lab Results from Tuesday, August 26, 2018    Outpatient IV Antimicrobial Therapy:  Start Date: 08/13/18  End Date: 08/28/18 (Day 15) clinic visit  Line: PORT  Referring pulmonologist: Maia Plan    IV Antibiotics Ordered:  -Tobramycin 240mg  IV every 12 hours (Conventional dosing, infuse over 30 minutes)  -Piperacillin-tazobactam (Zosyn) 4.5g IV every 6 hours       GENERAL LABS: please see Media Tab     CHEM   Home  1/14 Hospital  1/6   BUN   14   SCR  1.04 0.78   MG 1.8 1.6 (12/31)     CBC    Home  1/14 Hospital  1/6   WBC 7.5 6.1   HGB  13.7 12.1   HCT 41.7 38.5   PLT  357 333     Date andTime: Measured Drug Levels   1/14 @ 0830: Tobramycin Trough: <0.6 mcg/mL      Patient was called to discuss start and stop time of Tobramycin on date of drug level collection.    Patient Reported Administration Time:   Start of infusion: 0900   End of infusion: 0930       PLAN     Spoke with [name] Monday (1/13). Christian Young reported that labs were not collected the week he was discharged, nor did he call to notify anyone that his home health nurse had not reached out to him.   This Clinical research associate spoke with [facility]  on Friday (1/10) looking for labs with no return call or receipt of labs.     Labs collected on Tuesday of this week. Patient was scheduled for Endocrine and Pulmonary on Thursday but canceled his appointments.  Spoke with [name] today  Friday (1/17). [place] was unable to find a ride to clinic, so he had to cancel them.   He IV antibiotics going to end tomorrow morning, he feels comfortable ending therapy and de-accessing himself.     Feels well other than some allergies and stuffy nose.a     Discussed return to clinic in the next month. Will MyChart him available days to see if it lines up with his wife's work schedule.     1. Results note forwarded to: Maia Plan  2. Drug levels forwarded to CPP Michel Santee, RN

## 2018-09-02 NOTE — Unmapped (Signed)
Messaged Manoj about returning to clinic after his hospital stay and course of home IV antibiotics.     He was unable to attending his endocrine appointments and pulmonary appointments on January 16th as he had no ride.     Will await his response.     [name], RN  CF Nurse Coordinator   [phone]

## 2018-09-02 NOTE — Unmapped (Signed)
Called Christian Young to discuss his cpap supplies that he mentioned he could not pay the co-pay for while in the hospital. Wanted to see if he had been able to figure out what the copay amount is or if he had been able to get the supplies. Left a voicemail.

## 2018-09-02 NOTE — Unmapped (Signed)
[facility]      [facility]     Christian Young is a 33 y.o. male followed by [facility] for his CF. SW contacted Christian Young to discuss reported affordability of care concerns w/ his CPAP supplies and to check on current behavioral health supports since last conversation in clinic. Re: CPAP supplies, Christian Young also working with RT/ Lupita Leash but shared that he needed to f/u with his manufacturer information ([employer]). SW passed on information to CF RT and also encouraged Christian Young to contact his local HME supplier ([employer]) to discuss possibility of sliding scale option for his needed equipment (masks and hoses). Christian Young is also not aware whether he would need another sleep study to qualify for this help. In addition, SW inquired about other possible psychosocial needs. Christian Young shared that his wife Christian Young is still working full-time and denies need for current bill pay support. They have decided not to pursue a SSI application at this time after conversation with CF Legal. Regarding his mental health, Christian Young has left [facility], sharing concerns with medications he was prescribed, including Vraylar, which he has now stopped. He also mentioned that pts are required to have a substance misuse diagnosis to continue there long-term.     He has an initial appointment with a psychiatrist (Dr. Rene Kocher with [facility], [address] [place], [place], [place] [address], ph: [phone]) on Friday. Christian Young shared that Dr. Rene Kocher will then refer him locally for therapy. Christian Young noted that his anxiety has been pretty horrible recently with several episodes of panic attacks over the last several weeks, which have made it more challenging to leave his home or be in situations where he feels little control. Christian Young said that these panic attacks started during a recent trip with his wife where the plane was forced to re-route to another airport and make an emergency landing. SW offered empathy and support around coping with panic attacks before they happen, anxiety management and self care, and what to do in a moment of panic to try and calm his nervous system. Christian Young is continuing to take his psychotropic medications as prescribed. He denies further psychosocial needs or concerns today. CF SW will remain available.    Jerl Mina, LCSW  September 02, 2018 3:35 PM

## 2018-09-04 NOTE — Unmapped (Signed)
Contacted the patient to discuss his need for financial assistance obtaining cpap supplies. He is currently not using his cpap as he cannot afford the co-pay the supplies require. I let the patient know according to a [employer] representative I contacted, their is financial assistance available. The patient must go through the DME company he uses which is [employer] to apply. Encouraged him to do so and to please let me know if there are further questions, concerns or issues.

## 2018-09-08 NOTE — Unmapped (Signed)
09/08/18    Spoke with Christian Young. Food insecurity endorsed and is agreeable to receiving grocery assistance via [employer].     Verified mailing address and phone number in EPIC are both accurate.    Christian Young provided verbal agreement to the following:  ?? To receive grocery assistance monthly via 50$ gift card.  ?? Duration of Assistance: 6 months (starting in February 2020).    Method of gift card delivery:  1. [name] [employer] - Mailing address to be verified monthly prior to gift card being mailed.  OR  2. Provide in person at [facility] clinic appointment or during hospital admission.    Jackqulyn Livings MPH, RD, LDN  Pager: [phone]

## 2018-09-08 NOTE — Unmapped (Signed)
Devonta communicated that February 19th will work for him. Front desk aware of the appointment day and time. Responded to [place] through [facility] about the day and time of the appointment.     [name], RN  CF Nurse Coordinator   [phone]

## 2018-09-08 NOTE — Unmapped (Signed)
Encounter opened in error

## 2018-09-17 NOTE — Unmapped (Signed)
09/16/2018-No refill for Symdeko , patient has 1 month on hand- JMG

## 2018-09-19 MED ORDER — INSULIN ASPART (U-100) 100 UNIT/ML (3 ML) SUBCUTANEOUS PEN
12 refills | 0 days | Status: CP
Start: 2018-09-19 — End: ?

## 2018-09-19 NOTE — Unmapped (Signed)
E scripted, the doses he describes taking are more than 2x what he was taking at his las visit in Nov. If that is the case, he should schedule a visit in near future to review why he's needing so much and the results he's getting in regard to blood sugars. Can you please let him know. Thank you  JDostou, MD

## 2018-09-19 NOTE — Unmapped (Signed)
Pharmacy called stating that they need a new rx for pt. For his Novolog. It has to be specific on the dosage. It cannot say as directed per MD. Pt states he is taking 60-80 units with each meal.

## 2018-09-20 NOTE — Unmapped (Signed)
Thank you  JDostou, MD

## 2018-09-20 NOTE — Unmapped (Signed)
Contacted pt and made him aware that his rx for Novolog was sent to the pharmacy and made him aware ofr Dr. Misty Stanley concerns that he is taking  more than 2x what he was taking at his las visit in Nov. And if that is the case, he should schedule a visit in near future to review why he's needing so much and the results he's getting in regard to blood sugars. Pt agreed and will make an appt.

## 2018-10-01 ENCOUNTER — Ambulatory Visit: Admit: 2018-10-01 | Discharge: 2018-10-01 | Payer: PRIVATE HEALTH INSURANCE

## 2018-10-01 MED ORDER — LIDOCAINE-PRILOCAINE 2.5 %-2.5 % TOPICAL CREAM
Freq: Two times a day (BID) | TOPICAL | 0 refills | 0 days | Status: SS
Start: 2018-10-01 — End: 2019-03-27

## 2018-10-01 NOTE — Unmapped (Addendum)
Pulmonary Clinic - Follow-up Visit      HISTORY:     Interval History:  Diagnosed at 5 with chronic infections, had consolidation and parapneumonic effusion,sent to [facility] for treatment since then.   [id number] and [id number] insertion. 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    Patient was last seen by me in November 2019.  Due to concerns of an upcoming cystic fibrosis exacerbation, we got him admitted.  He was treated with IV Zosyn and tobramycin for Pseudomonas.  He eventually felt better and was discharged.  AFB sputum was collected and sent for culture.  Given his CT scan was found to be stable, and AFB smear was negative, on our recommendation, the inpatient team stopped his MAC treatment.  Since then, he has not received any azithromycin or amikacin.  However, in early December, he did come up with another exacerbation and received ciprofloxacin for 14 days from me as an outpatient and improved after that.    He reports feeling a lot better today.  Denies any fevers or chills or runny nose or cough or sore throat.  Denies any diarrhea or vomiting.  Denies any constipation.  Denies any mental health issues as well.    Cystic fibrosis issues:    Sinuses: Does nasal rinses only once or twice a week.  Reports clear liquid coming out even when he does it.  Does not feel like he is coming up with an exacerbation    Airway clearance: Does albuterol, and 7% hypertonic saline twice a day.  Reports compliance to it.  Does Pulmozyme as well twice a day.  Uses the vest occasionally.  Mainly forgets using it because it is corded and he gets restricted to 1 place.  Does not use any inhaled antibiotics at this point.    GI and nutrition: Denies any constipation or diarrhea.  Reports compliance to his Creon.  Wants to switch to a vegan diet after watching a Netflix show.  Wants to start in March.    Hypertension: On lisinopril 10 mg, compliant, is also on Coreg 3.125 mg twice a day.  This was started in 2018 as an inpatient for sinus tachycardia.  Denies any palpitations or chest pain currently.    Diabetes: Followed and managed by endocrinology.  Reports compliance to his insulin.    Mental health issues: Used to be seen by a nurse practitioner and was on 4-5 medicines.  Now starts following with a psychiatry provider in [place], and is on Prozac 40 mg, Lamictal and Xanax which eventually will be tapered off according to him once the Prozac starts working.  He reports feeling no anxiety, and feels that his mind is a lot more clear and he can think a lot more clear.    Physical fitness: Barely exercises.  Wants to start going to [employer] however arranging for finances.        Past Medical History:   Diagnosis Date   ??? Anxiety    ??? Chronic pain disorder    ??? Cystic fibrosis (CMS-HCC)    ??? Depression    ??? Hypertension    ??? Nonproductive cough 04/05/2018     Past Surgical History:   Procedure Laterality Date   ??? PR REMOVAL OF LUNG,LOBECTOMY Right 03/29/2017    Procedure: REMOVAL OF LUNG, OTHER THAN PNEUMONECTOMY; SINGLE LOBE (LOBECTOMY);  Surgeon: Cherie Dark, MD;  Location: MA[facility];  Service: Thoracic       Other History:  The social history and family history were personally reviewed and updated in the patient's electronic medical record.     Home Medications:  Current Outpatient Medications on File Prior to Visit   Medication Sig Dispense Refill   ??? ALPRAZolam (XANAX) 0.5 MG tablet Take 0.5 mg by mouth Three (3) times a day.     ??? budesonide-formoterol (SYMBICORT) 160-4.5 mcg/actuation inhaler Inhale 2 puffs Two (2) times a day. 20 g 11   ??? cetirizine (ZYRTEC) 10 MG tablet TAKE 1 TABLET (10 MG TOTAL) BY MOUTH DAILY. 90 tablet 3   ??? diclofenac sodium (VOLTAREN) 1 % gel Apply 2 g topically Four (4) times a day. (Patient taking differently: Apply 2 g topically 4 (four) times a day as needed. ) 100 g 2   ??? dornase alfa (PULMOZYME) 1 mg/mL nebulizer solution Inhale 2.5 mg Two (2) times a day. 450 mL 3   ??? FLUoxetine (PROZAC) 20 MG capsule Take 40 mg by mouth daily.     ??? gabapentin enacarbil (HORIZANT) 600 mg TbER Take 1,200 mg by mouth nightly.      ??? HYDROcodone-acetaminophen (NORCO) 5-325 mg per tablet TAKE 1/2 TO 1 TABLET BY MOUTH EVERY 8 TO 12 HOURS AS NEEDED FOR SEVERE PAIN     ??? insulin ASPART (NOVOLOG FLEXPEN) 100 unit/mL (3 mL) injection pen Use up to 150 units/day, divided TID AC meals (Patient taking differently: 60-80 units TID AC) 135 mL 12   ??? insulin glargine (BASAGLAR, LANTUS) 100 unit/mL (3 mL) injection pen Inject 0.7 mL (70 Units total) under the skin daily. 30 mL 0   ??? lamoTRIgine (LAMICTAL) 200 MG tablet Take 200 mg by mouth daily.     ??? lipase-protease-amylase (CREON) 36,000-114,000- 180,000 unit CpDR TAKE 7-8 CAPSULES BY MOUTH WITH MEALS AND 5-6 CAPSULES WITH SNACKS 1800 capsule 3   ??? lisinopril (PRINIVIL,ZESTRIL) 10 MG tablet TAKE 1 TABLET BY MOUTH EVERY DAY 90 tablet 1   ??? melatonin 10 mg Tab Take 10 mg by mouth nightly.     ??? montelukast (SINGULAIR) 10 mg tablet Take 1 tablet (10 mg total) by mouth nightly. 90 tablet 3   ??? MVW COMPLETE FORMUL PROBIOTIC 40 billion cell -15 mg CpDR Take 1 capsule by mouth daily. 90 capsule 3   ??? MVW COMPLETE FORMULATION D5000 5,000-800 unit-mcg cap Take 2 capsules by mouth daily. 180 capsule 3   ??? omeprazole (PRILOSEC) 20 MG capsule Take 1 capsule (20 mg total) by mouth daily. 30 capsule 0   ??? pramipexole (MIRAPEX) 0.125 MG tablet Take 0.25 mg by mouth daily.      ??? sodium chloride 7% 7 % Nebu Inhale 4 mL by nebulization Two (2) times a day. Increase to 4 times while taking antibiotics (Patient taking differently: Inhale 4 mL by nebulization Two (2) times a day. ) 720 mL 3   ??? tezacaftor 100mg /ivacaftor 150mg  and ivacaftor 150mg  (SYMDEKO) tablets TAKE BY MOUTH AS DIRECTED ON PACKAGE LABELING (Patient taking differently: Take 1 tablet by mouth Two (2) times a day. TAKE BY MOUTH AS DIRECTED ON PACKAGE LABELING) 56 each 3   ??? traZODone (DESYREL) 100 MG tablet TAKE 1 TABLET BY MOUTH NIGHTLY (Patient taking differently: Take 200 mg by mouth nightly. ) 90 tablet 3   ??? XARELTO 20 mg tablet Take 20 mg by mouth daily.     ??? albuterol (PROVENTIL HFA;VENTOLIN HFA) 90 mcg/actuation inhaler Inhale 2 puffs every six (6) hours as needed. 1 Inhaler 1   ??? albuterol 2.5  mg/0.5 mL nebulizer solution Inhale 0.5 mL (2.5 mg total) by nebulization every six (6) hours as needed for wheezing. 90 each 3   ??? blood sugar diagnostic Strp Dispense 150 blood glucose test strips, ok to sub any brand preferred by insurance/patient, use up to 5x/day, dx E 08.9 450 strip 12   ??? blood-glucose meter Misc Dispense meter that is preferred by patient's insurance company 1 each 0   ??? blood-glucose meter,continuous (DEXCOM G6 RECEIVER) Misc Use as directed 1 each 0   ??? blood-glucose sensor (DEXCOM G6 SENSOR) Devi Use sq as directed every 14 days 6 Device 11   ??? blood-glucose transmitter (DEXCOM G6 TRANSMITTER) Devi Use as directed 1 Device 11   ??? glucagon, human recombinant, (GLUCAGON) 1 mg/ml injection Inject 1 mL (1 mg total) under the skin once as needed (severe hypoglycemia). Follow package directions for low blood sugar. 1 mL 11   ??? lancets Misc Dispense 150 lancets, ok to sub any brand preferred by insurance/patient, use up to 5x/day, dx E08.9 150 each 12   ??? nebulizers Misc Use as directed with inhaled medications 4 each 3   ??? pen needle, diabetic (BD ULTRA-FINE NANO PEN NEEDLE) 32 gauge x 5/32 Ndle ok to sub any brand or size needle preferred by insurance/patient, use up to 4x/day, dx E 08.9 450 each 12   ??? [DISCONTINUED] insulin detemir U-100 (LEVEMIR FLEXTOUCH U-100 INSULN) 100 unit/mL (3 mL) injection pen Use up to 30 units per day, as per MD instructions 30 mL 12     No current facility-administered medications on file prior to visit.         Allergies:  Allergies as of 10/01/2018 - Reviewed 10/01/2018   Allergen Reaction Noted   ??? [place] [aztreonam lysine] Anaphylaxis 12/27/2016 ??? Cefepime Itching and Nausea Only 09/06/2015   ??? Other Anaphylaxis and Other (See Comments) 09/06/2015   ??? Slo-bid 100 Anaphylaxis 06/20/2017   ??? Banana Itching and Nausea And Vomiting 07/29/2016   ??? Tobramycin Tinnitus 09/06/2015     Genetics:  S945L and [id number] insertion  ??  Airway Clearance Regimen:  HS BID  Pulmozyme  ??  Inhaled ABX:  Amikacin  ??  Hemoptysis:   No  ABPA:             No  Ptx:                  No  Sinusitus:       Yes  ??  Panc Insuf:     Yes  PEG:                No  DIOS:              No bowel issues  CF Liver Dz:   No  ??  Diabetes:        Uncontrolled - follows endocrine  Osteopenia:   N/A  ??  Depression:   Yes  ??  Other Co-morbidities:  N/A  ??  Social Setting:  Lives with wife near [place]    Micro History:  ??  MABSC  - Assumed S to erythro from [facility]  06/2017: MASBC not present, but M gordonae is   ??  Smooth PsA  S: Cipro, Levo, Tobra  I: Aztreo, Cefe, Ceftaz, PT, LVX  ??  Stenotrophamonas  R: Ceftat  ??  MABSC (macrolide sensitive)  Last Negative Cx: 03/2017  Last Positive: 02/21/17  ??  ??  Review of Systems:  A comprehensive review  of systems was completed and negative except as noted in HPI.    PHYSICAL EXAM:   BP 119/75  - Pulse 105  - Temp 36.7 ??C  - Wt (!) 122.5 kg (270 lb)  - SpO2 96%  - BMI 36.62 kg/m??     Gen: Patient awake, alert, oriented to time place and person, no pallor, icterus,  cyanosis or clubbing.  Eyes: Pupils equal round and reacting light bilaterally  Head neck ENT: No JVD, no lymphadenopathy-cervical or supraclavicular, no thyromegaly, moist mucosa, normal oropharynx  CVS: S1-S2 heard normally, no murmurs appreciated, no rubs or gallops  Respiratory: Air entry equal bilaterally without any crackles or wheeze  Abdomen: Soft, nontender, nondistended, no organomegaly, bowel sounds present  Neuro: Nonfocal neurological exam, no sensory deficits, cranial nerves grossly intact  Skin extremities: No rash, no pedal edema      LABORATORY and RADIOLOGY DATA:     Pulmonary Function Tests:        Pertinent Laboratory Data:  CF Sputum Culture   Date Value Ref Range Status   08/15/2018 3+ Oropharyngeal Flora Isolated  Final   08/15/2018 1+ Mucoid Pseudomonas aeruginosa (A)  Final   08/15/2018 <1+ Smooth Pseudomonas aeruginosa (A)  Final     Comment:     Susceptibility Testing By Consultation Only        AFB Smear   Date Value Ref Range Status   06/24/2018  No Acid Fast Bacilli Seen Final    NO ACID FAST BACILLI SEEN- 3 negative smears do not exclude pulmonary TB. If active pulmonary TB is suspected, continue airborne isolation until pulmonary disease is excluded by negative cultures.     AFB Culture   Date Value Ref Range Status   06/24/2018 No Acid Fast Bacilli Detected  Final        IgE, Total   Date Value Ref Range Status   06/20/2017 32.8 4 - 59 kU/L Final        Total Bilirubin   Date Value Ref Range Status   08/15/2018 0.3 0.0 - 1.2 mg/dL Final     AST   Date Value Ref Range Status   08/15/2018 32 19 - 55 U/L Final     ALT   Date Value Ref Range Status   08/15/2018 54 (H) <50 U/L Final     Alkaline Phosphatase   Date Value Ref Range Status   08/15/2018 71 38 - 126 U/L Final        INR   Date Value Ref Range Status   08/13/2018 1.23  Final        Hemoglobin A1C   Date Value Ref Range Status   08/12/2018 8.5 (H) 4.8 - 5.6 % Final     Estimated Average Glucose   Date Value Ref Range Status   08/12/2018 197 mg/dL Final        Vitamin A   Date Value Ref Range Status   11/19/2016 44.4 32.5 - 78.0 mcg/dL Final     Comment:        -------------------ADDITIONAL INFORMATION-------------------  This test was developed and its performance characteristics   determined by [facility] in a manner consistent with CLIA   requirements. This test has not been cleared or approved by   the U.S. Food and Drug Administration.     Test Performed by:  [facility]  [address] [place], [place], [place] [address]     Vitamin D Total (25OH)  Date Value Ref Range Status   03/19/2017 27.9 20.0 - 80.0 ng/mL Final            ASSESSMENT and PLAN     Christian Young is a 33 y.o. male with cystic fibrosis comes for follow-up.     Overall, patient doing a lot better.  For the last 8 months that have been following him, this is 1 of the best clinic visits that he has had with me.  He seems extremely motivated to make changes in his life and does not have any physical symptoms of CF exacerbation.  We spoke in detail today, about being compliant with Symdeko.  He receives all his medications from his local pharmacy which are boxed together however since he receives Symdeko from the [facility] care services, he forgets taking it.  We, along with pharmacy systems, had him set alarms on his phone, to remind him to take his cystic fibrosis medication.    In terms of his airway clearance, he is compliant with the albuterol and hypertonic saline.  However regarding the vest therapy, we will touch base with our respiratory therapist, to see if we can provide him with a portable vest, which he agrees would really help him as he could perform his day-to-day activities while he has the vest on and would not need to be sitting in one place, which seems to be a major factor affecting his compliance.  He has already tried the [place] and the Acapella however has not had any improvement or reduction in his exacerbations of infections.    I also advised him to start using nasal rinses regularly.    Our Child psychotherapist, is also provided him with resources to avail funding since he wants to start exercising at a gym.  I have already filled the appropriate form and handed over to our Child psychotherapist.    We also had our nutrition services, visit him to talk in detail about a switch from a highly red meat diet to vegetarian/vegan diet.  We have provided him with all the information, and have also advised him to count his calories and count the nutrition value of his current food intake for the next 2 weeks, and compared it when he switches to a vegan diet which would help assess any deficiencies appropriately.    Regarding his Coreg, we do not think he needs it right now given it is a small dose and his tachycardia was found to be sinus only.  We advised him to stop taking it, and in case he starts feeling palpitations and is symptomatic, we will restart it at a higher dose.    We are happy with his mental health progress, with him visiting a psychiatrist now and being in regular touch with them.  The goal is to reduce the Xanax over the next few months once Prozac starts working appropriately.  I will reach out to the mental health provider in [place] and share our notes and concerns with him.    Currently, he is not on any inhaled antibiotic, which she would benefit from given the recurrent Pseudomonas infections.  However, we will check with pharmacy to see if he could be covered for tobramycin inhaler.  We will readdress this issue in his next visit.      The patient was seen with Dr. Curley Spice and will return to clinic in 3 months    Maia Plan, MD  PGY 4, Pulmonary and Critical Care  Pager: [phone]  October 01, 2018 2:29 PM     ______________________________________________________________________  Teaching Attending Attestation:    I saw the patient with the resident. We formulated a joint history, exam, assessment and plan, as documented in Dr. Dianah Field note. I reviewed the note and agree with the plan and follow-up documented. He is trying to improve his overall health and wants to join a gym; we have provided information regarding grants that can help with this. He is interested in pursuing a vegan diet so we have had our dietician meet with him to ensure he does not miss micronutrients. He struggles with adherence to airway clearance and a portable vest (if approved by his insurance) might help. Finally, he does chronically grow PsA, and would benefit from chronic alternating months of inhaled tobramycin. Harrel Carina, MD  Pulmonary/Critical Care Medicine  Pediatric Pulmonology

## 2018-10-01 NOTE — Unmapped (Signed)
Adult Cystic Fibrosis Clinic Pharmacist Visit Note     Primary pulmonologist: Maia Plan (precepted by Dr. Curley Spice today)    Christian Young is a 33 y.o. male with cystic fibrosis (genotype: [phone]) being seen for annual medication assessment. Christian Young is present at visit with his wife. He reports that [facility] pill packs most of his medications including his Creon. The only things not pill packed are Symdeko and CF vitamins and probiotics.    Medication Review      Allergies:   Allergies   Allergen Reactions   ??? [place] [Aztreonam Lysine] Anaphylaxis   ??? Cefepime Itching and Nausea Only   ??? Other Anaphylaxis and Other (See Comments)     Other reaction(s): Other (See Comments)  Bananas: itchy throat  Slo Bid record from [employer] states anaphylaxis.????Pt states this was from childhood and does not know reaction.  Bananas, causes itchy throat   ??? Slo-Bid 100 Anaphylaxis   ??? Banana Itching and Nausea And Vomiting   ??? Tobramycin Tinnitus     From OSH record-documented as tinnitus but has received IV tobra with close monitoring.     AIRWAY CLEARANCE MEDICATIONS PATIENT ADHERENCE   Bronchodilator:  ? Albuterol Nebulized Solution Q6H PRN  ? Albuterol HFA Inhaler: Q6H PRN   No missed doses. Mix albuterol and HTS   Mucolytic(s):  ? Hypertonic Saline 7%: BID  ? Pulmozyme: BID   No missed doses  No missed doses   Other inhalers:  ? Symbicort 160-4.5: Inhale 2 puffs twice a day   No missed doses     ID/ABX PATIENT ADHERENCE   Inhaled antibiotics:  ? None   No cycled antibiotics   Chronic anti-inflammatory/suppressive ABX:  ? None   N/A   NTM:  ? History of NTM treatment, just completed 2019   N/A     CF RELATED CHRONIC MEDICATIONS PATIENT ADHERENCE   CFTR Modulator:  ? Symdeko: 1 tablet (TEZ 100mg /IVA150mg ) in the morning and 1 tablet (IVA) in the evening   Misses doses several times a week    GI/FEN   Pancreatic Enzyme:  ? Creon 36,000 units: Take 7-8 capsules with meals and 5-6 capsules with snacks Denies missed doses. Averages 3 meals/day and 1 snacks/day.   Reflux:  ? Omeprazole 20mg  daily   No missed doses   Vitamins:  CF Multivitamin:   ? MVW Complete D5000 Softgels : 2 capsule(s) daily  Other vitamins:  ? Probiotic:1 capsule(s) daily Gets CF vitamins from: MVW     Missed doses: More than half the week     No missed doses   ENDOCRINE   CFRD:  ? Basaglar (glargine): 70 units SQ nightly  ? NovoLog (aspart): 60-80 unitsTID AC   No missed doses   No missed doses 1-2 times/week hypoglycemia 70s   SINUS/ALLERGY   Oral allergy medications/other:  ? Montelukast 10mg  daily   ? Cetirizine 10mg  daily   No missed doses  No missed doses   NEURO/PSYCH   Anxiety/Depression:  ? Fluoxetine 40mg  daily  ? Alprazolam 0.5mg  TID  ? Lamotrigine 200mg  daily  ? Gabapentin 1200mg  nightly  ? Trazodone 200mg  nightly   No missed doses   No missed doses  No missed doses  No missed doses  No missed doses   OTHER MEDICATIONS   ? Lisinopril 10mg  daily  ? Melatonin 10mg  nightly  ? Carvedilol 3.125mg  BID  ? Hydrocodone 5/325mg  1/2 to 1 tab Q8-12HPRN severe pain  ? Pramipexole 0.25mg  daily  No  missed doses   No missed doses  No missed doses;  HR still elevated but no symptoms  Takes PRN: last needed yesterday  No missed doses     Patient identified barriers to adherence:  ? Time  ? Forgetfulness  ? Lack of perceived benefit    Reported Side Effects: None     Labs/Testing   Height/Weight:  Ht Readings from Last 3 Encounters:   08/14/18 182.9 cm (6')   06/19/18 183 cm (6' 0.05)   06/19/18 182.9 cm (6' 0.01)     Wt Readings from Last 3 Encounters:   10/01/18 (!) 122.5 kg (270 lb)   08/14/18 (!) 120.7 kg (266 lb 3.2 oz)   06/19/18 (!) 121.6 kg (268 lb)     Lung function:   Date FEV1   09/11/17 55.6%   10/23/17 59.6%   03/17/18 62%   04/07/18 60.2%   06/19/18 63%   10/01/18 61.6%     Sputum culture history:   08/21/17, 03/17/18, 06/23/18: Smooth Pseudomonas aeruginosa, OPF  08/15/18: Mucoid Pseudomonas aeruginosa, Smooth Pseudomonas aeruginosa, OPF    Last AFB:   06/24/18: Negative (NTM treatment stopped in 11/19)  Last positive was 06/20/17     Pulmonary Exacerbations:  ? Last: Date 08/2018: IV antibiotics: Piperacillin/tazobactam, Tobramycin 240mg  IV Q12H    Month/Yr Antibiotics   12/19 Ciprofloxacin   11/19 Tobamycin 230mg  IV Q12H, piperacillin/tazobactam   08/19 Tobramycin 250mg  IV Q12H, piperacillin/tazobactam   05/19 Tobramycin 250mg  IV Q12H, ciprofloxacin   01/19 Tobramycin 360mg  IV Q12H, piperacillin/tazobactam, minocycline     Last Vitamin Levels:   Date A D (25OH) E   03/19/17 ---- 27.9 ng/mL ----   11/19/16 44.4 mcg/dL ---- ----     Last OGTT: Has CFRD    Last LFTs:  Lab Results   Component Value Date    ALKPHOS 71 08/15/2018    BILITOT 0.3 08/15/2018    BILIDIR <0.10 08/15/2018    PROT 6.3 (L) 08/15/2018    ALBUMIN 3.6 08/15/2018    ALT 54 (H) 08/15/2018    AST 32 08/15/2018     Assessment/Recommendations     # CYSTIC FIBROSIS WITH PULMONARY INVOLVEMENT:   ? Moderate lung function (FEV1 40-69%). Lung function stable.  ? Inhaled Therapies: Airway clearance medications as above. No changes, continue.  ? CFTR Modulator: Stable on CFTR Modulator: Symdeko, LFTs were last done 08/2018, next due 08/2019. Encouraged to set alarm for Symdeko BID since it's not in his pill pack.  ? Sinus/Allergy: Not doing his nasal rinses but taking montelukast and Symbicort. Encouraged to continue.     # ID/ABX:   ? Primary grows Mucoid Pseudomonas aeruginosa, Smooth Pseudomonas aeruginosa, OPF. Has had 6 exacerbations in the last year.   ? Cycled Antibiotics: No cycled antibiotics. Discussed cycling inhaled tobramycin to decreased frequency of exacerbations, may be the next therapy to add on but focused on increasing compliance with SYMDEKO.  ? Not on chronic azithromycin due to h/o positive AFBs.   ? NTM History: History of NTM Treatment that completed in November 2019 (azithromycin, clofazimine, and Arikayce).    # FEN/GI:   ? Pancreatic Enzymes: Current enzyme dose as above. Per patient's reported amount of meals/snacks per day, within daily limits of units of lipase/kg/day. No GI complaints or changes in stool. No changes, continue.  ? GERD: Reflux symptoms controlled on omeprazole. Continue for GERD.  ? Vitamins: Last vitamin levels are not up to date. Currently on CF-specific vitamin above but non-compliant.  Encouraged strategies to remember to take such as setting alarm.     # ENDOCRINE:  ? CFRD: Managed by endocrine, has DEXCOM and reports compliance with his insulin regimen. Has occasional lows.   o HTN: BP in goal <140/90 on lisinopril 10mg  daily and carvedilol 3.125mg  BID. Discussed with MD, plan to DC carvedilol and monitor. If BP increases, can increase lisinopril.     # NEURO/PSYCH:  ? Anxiety/Depression: On medication therapy,local MH managing and has changed his medications, currenlty on fluoxetine 40mg  daily, alprazolam 0.5mg  TID, and lamotrigine 200mg  daily, gabapentin XR 1200mg  nightly. Tolerating medication(s), no changes recommended.    ? Insomnia: On medication therapy,melatonin and trazodone, no changes.   ? Pain: On Norco PRN through local provider.     Pertinent drug Interactions noted: No   Recommended labs to be monitored:   ? Annual LFTs (due next January 2021)  ? Vitamin levels (due at next visit)    Medication management:  Adherence: Moderate compliance: Minor variances to doses prescribed, misses occasional doses during the week.  Understands how to refill medications and all assistance programs: Yes     Access of CF Medications   ? Pulmozyme fills at [facility]  ? Pancreatic Enzymes fills at [facility]  ? Symdeko fills at [facility]    Eligible Copay Assistance Program(s):   ? Healthwell CF Treatment Grant through 05/07/19  ? Healthwell CF Vitamins/Supplements Grant through 05/07/19  ? Pulmozyme Copay Card  ? Vertex GPS Program  ? Creon CF Care Forward Program    Summary of Chronic Medication Changes:  ? STOP Carvedilol and call if symptomatic tachycardia. Called [facility] to let them know it was stopped.  ? PRN EMLA Cream for port care     Time spent with patient: 20 minutes    [name], PharmD, BCPS, CPP  Clinical Pharmacist Practitioner  [facility]  Pager: [phone]

## 2018-10-01 NOTE — Unmapped (Signed)
The patient requested a vest demo. We did the demo with the Monarch vest. The patient wishes to obtain one. They have tried acapella and [place] devices that were not effective. Will work to obtain one. Emailed [place] [facility] for advice on the age of his current vest. No further needs or concerns today.

## 2018-10-01 NOTE — Unmapped (Signed)
-   Nutrition  - Social work for [employer]  - RT for portable vest      You were seen in clinic today by Dr [name] and Dr Curley Spice  We will call you with results if any labs or imaging ordered.  Please call the clinic if you have any further questions or call 911 for emergency.  Thank you for giving me the opportunity to manage your health.    Between appointments, you can reach [name] at these numbers:    For appointments or the Pulmonary Nurse: [phone]  Fax: [phone]    For urgent issues after hours:  Hospital Operator: [phone], ask for Pulmonary Fellow on call

## 2018-10-01 NOTE — Unmapped (Signed)
[facility]  Adult Cystic Fibrosis Clinic    SW met with pt to follow up on grocery card - it was returned to clinic. Pt and wife verified that address was correct and they did not know why it wouldn't have been delivered. SW gave pt the card.     SW provided CFLF peer grant application. Provider completed provider section and SW returned it to pt.      CF SW provided a bag of groceries from clinic food pantry today.  Number of people served: 2    Eesha Schmaltz, LCSW

## 2018-10-03 NOTE — Unmapped (Signed)
Cystic Fibrosis Nutrition Note    Outpatient: MD Consult this visit related to cystic fibrosis protocol - weight loss, vegan/vegetarian diet  Primary Pulmonologist/Referring Provider: Dr. [name]  ===================================================================  PLAN/INTERVENTION:  1. Discussed his goal to decrease intake of red meat. He can replace this with other lean proteins, fruit/veggies, whole grains.   2. Reviewed importance of adequate calorie intake to support lung health in CF and considering his goal for weight loss.   3. Reviewed that he will still require adequate fat intake considering malabsorption with CF and need to consume fat when taking CFTR modulator. Recommend foods like fish/seafood, nuts, avocado as they are unsaturated fats.  4. Encouraged [name] to keep in touch with RD via MyChart re: progress in diet changes and weight loss.    Outpatient:  Will follow up with patient per nutrition risk protocol for CF.  ==================================================================   GOALS:   1. Work towards Public affairs consultant Index:   A.  Adults 23 kg/m2 for CF males OR 22 kg/m2 for CF females   B. Children >50%ile/age body mass index or weight-for-length on CDC charts  2. Meet estimated nutritional needs considering CF and desire for weight loss:   2900-3400 kcals (24-28 kcals/kg/day); 146-194 gm pro (DRI x 1.5 -2); free water (Holliday Segar Method)  ==================================================================   Cystic Fibrosis Nutrition Category = Outstanding  ================================================================   CLINICAL DATA:  Christian Young is a 33 y.o. male seen today to review vegan/vegetarian diet and meeting nutritional needs for CF. [name] accompanied by his wife today.  Christian Young reports an outside provider suggested he consider becoming vegan to help meet weight/health goals and recommended a documentary to watch as well.  - He states that he eats a lot of red meat and identifies that decreasing and eventually eliminating from his diet is is initial/primary goal.   - Thru discussion he and his wife reveal they are looking into possibly transitioning to more of a pescatarian or vegetarian diet, not vegan.    Past Medical History:   Diagnosis Date   ??? Anxiety    ??? Chronic pain disorder    ??? Cystic fibrosis (CMS-HCC)    ??? Depression    ??? Hypertension    ??? Nonproductive cough 04/05/2018     Anthropometric Evaluation:  Weight changes: Weight is up.    BMI Readings from Last 3 Encounters:   10/01/18 36.62 kg/m??   08/14/18 36.10 kg/m??   06/19/18 36.30 kg/m??     Wt Readings from Last 3 Encounters:   10/01/18 (!) 122.5 kg (270 lb)   08/14/18 (!) 120.7 kg (266 lb 3.2 oz)   06/19/18 (!) 121.6 kg (268 lb)     Ht Readings from Last 3 Encounters:   08/14/18 182.9 cm (6')   06/19/18 183 cm (6' 0.05)   06/19/18 182.9 cm (6' 0.01)

## 2018-10-06 NOTE — Unmapped (Signed)
Faxed a prescription to [facility] for a Monarch chest vest. Notes were efaxed. Patient notified via My Chart.

## 2018-10-08 MED ORDER — TEZACAFTOR 100 MG-IVACAFTOR 150 MG(DAY)/IVACAFTOR 150 MG(NITE) TABLETS
11 refills | 0 days | Status: CP
Start: 2018-10-08 — End: 2019-10-08
  Filled 2018-10-09: qty 56, 28d supply, fill #0

## 2018-10-08 NOTE — Unmapped (Signed)
[facility] Refill Coordination Note    Specialty Medication(s) to be Shipped:   CF/Pulmonary: -SYMDEKO (tezacaftor 100mg /ivacaftor 150mg  and ivacaftor 150mg ) tablets    Other medication(s) to be shipped: N/A     Christian Young, DOB: 10-10-1985  Phone: [phone] (home)       All above HIPAA information was verified with patient.     Completed refill call assessment today to schedule patient's medication shipment from the [facility] [phone]).       Specialty medication(s) and dose(s) confirmed: Regimen is correct and unchanged.   Changes to medications: Davione reports no changes reported at this time.  Changes to insurance: No  Questions for the pharmacist: No    The patient will receive a drug information handout for each medication shipped and additional FDA Medication Guides as required.      DISEASE/MEDICATION-SPECIFIC INFORMATION        For CF patients: CF Healthwell Grant Active? Yes    ADHERENCE     Medication Adherence    Patient reported X missed doses in the last month:  0  Specialty Medication:  symdeko  Patient is on additional specialty medications:  No  Patient is on more than two specialty medications:  No  Any gaps in refill history greater than 2 weeks in the last 3 months:  no  Support network for adherence:  family member          Refill Coordination    Has the Patients' Contact Information Changed:  No  Is the Shipping Address Different:  No         MEDICARE PART B DOCUMENTATION     symdeko: Patient has 1 week on hand.    SHIPPING     Shipping address confirmed in Epic.     Delivery Scheduled: Yes, Expected medication delivery date: 10/10/2018 via UPS or courier.     Medication will be delivered via UPS to the home address in Epic [place].    Christian Young   [facility] Specialty Technician

## 2018-10-08 NOTE — Unmapped (Signed)
Adult Cystic Fibrosis Clinic Pharmacist Note: Medication Management/Renewals     Sent medication orders for Christian Young.    1. Cystic fibrosis (CMS-HCC)  - tezacaftor 100mg /ivacaftor 150mg  and ivacaftor 150mg  (SYMDEKO) tablets; TAKE BY MOUTH AS DIRECTED ON PACKAGE LABELING  Dispense: 56 each; Refill: 11      Pharmacy sent to:  [facility]      [name], PharmD, BCPS, CPP  Clinical Pharmacist Practitioner  [facility] Adult Cystic Fibrosis [facility]  [facility]  Pager: [phone]

## 2018-10-09 MED FILL — SYMDEKO 100 MG-150 MG (DAY)/150 MG (NIGHT) TABLETS: 28 days supply | Qty: 56 | Fill #0 | Status: AC

## 2018-10-10 NOTE — Unmapped (Signed)
[facility]  Adult Cystic Fibrosis Clinic    As part of [facility] CF Grocery Assistance Program, Mailed patient [phone] gift card for food due to financial difficulties and associated food insecurity in clinic on October 10, 2018.

## 2018-11-03 MED ORDER — LEVOFLOXACIN 750 MG TABLET
ORAL_TABLET | Freq: Every day | ORAL | 0 refills | 0.00000 days | Status: CP
Start: 2018-11-03 — End: 2018-11-25

## 2018-11-03 NOTE — Unmapped (Signed)
Received a page from the patient.  He reports having yellowish-greenish sputum production with mild cough and some nasal stuffiness for the last 2 to 3 days.  It has not been affecting his day-to-day activities.  He does not report having any fever.  He traveled to [place] recently.  Reports not coming in contact with anybody who was sick, and does not report anybody known to have coronavirus positive status around.  His wife is also in good health.    I will be prescribing him levofloxacin 750 mg daily for 14 days.  However I told him, that in case he develops fever, he should call the health Link number that he has to speak to them regarding possibility of ruling out Covid 19.  He understands that the virus can be presenting in different forms however currently he feels that the antibiotic should work and would want to wait for any worsening of symptoms prior to getting tested.    I informed him, that he should keep me updated and message me on epic message regarding his improvement in symptoms or otherwise.      Maia Plan, MD  PGY 4, Pulmonary and Critical Care  Pager: [phone]  November 03, 2018 12:38 PM

## 2018-11-03 NOTE — Unmapped (Signed)
[facility] Shared [facility] Specialty Pharmacy Clinical Assessment & Refill Coordination Note    Christian Young, Coward: 1986/06/10  Phone: [phone] (home)     All above HIPAA information was verified with patient.     Specialty Medication(s):   CF/Pulmonary: -SYMDEKO (tezacaftor 100mg /ivacaftor 150mg  and ivacaftor 150mg ) tablets     Current Outpatient Medications   Medication Sig Dispense Refill   ??? albuterol (PROVENTIL HFA;VENTOLIN HFA) 90 mcg/actuation inhaler Inhale 2 puffs every six (6) hours as needed. 1 Inhaler 1   ??? albuterol 2.5 mg/0.5 mL nebulizer solution Inhale 0.5 mL (2.5 mg total) by nebulization every six (6) hours as needed for wheezing. 90 each 3   ??? ALPRAZolam (XANAX) 0.5 MG tablet Take 0.5 mg by mouth Three (3) times a day.     ??? blood sugar diagnostic Strp Dispense 150 blood glucose test strips, ok to sub any brand preferred by insurance/patient, use up to 5x/day, dx E 08.9 450 strip 12   ??? blood-glucose meter Misc Dispense meter that is preferred by patient's insurance company 1 each 0   ??? blood-glucose meter,continuous (DEXCOM G6 RECEIVER) Misc Use as directed 1 each 0   ??? blood-glucose sensor (DEXCOM G6 SENSOR) Devi Use sq as directed every 14 days 6 Device 11   ??? blood-glucose transmitter (DEXCOM G6 TRANSMITTER) Devi Use as directed 1 Device 11   ??? budesonide-formoterol (SYMBICORT) 160-4.5 mcg/actuation inhaler Inhale 2 puffs Two (2) times a day. 20 g 11   ??? cetirizine (ZYRTEC) 10 MG tablet TAKE 1 TABLET (10 MG TOTAL) BY MOUTH DAILY. 90 tablet 3   ??? diclofenac sodium (VOLTAREN) 1 % gel Apply 2 g topically Four (4) times a day. (Patient taking differently: Apply 2 g topically 4 (four) times a day as needed. ) 100 g 2   ??? dornase alfa (PULMOZYME) 1 mg/mL nebulizer solution Inhale 2.5 mg Two (2) times a day. 450 mL 3   ??? FLUoxetine (PROZAC) 20 MG capsule Take 40 mg by mouth daily.     ??? gabapentin enacarbil (HORIZANT) 600 mg TbER Take 1,200 mg by mouth nightly.      ??? glucagon, human recombinant, (GLUCAGON) 1 mg/ml injection Inject 1 mL (1 mg total) under the skin once as needed (severe hypoglycemia). Follow package directions for low blood sugar. 1 mL 11   ??? HYDROcodone-acetaminophen (NORCO) 5-325 mg per tablet TAKE 1/2 TO 1 TABLET BY MOUTH EVERY 8 TO 12 HOURS AS NEEDED FOR SEVERE PAIN     ??? insulin ASPART (NOVOLOG FLEXPEN) 100 unit/mL (3 mL) injection pen Use up to 150 units/day, divided TID AC meals (Patient taking differently: 60-80 units TID AC) 135 mL 12   ??? insulin glargine (BASAGLAR, LANTUS) 100 unit/mL (3 mL) injection pen Inject 0.7 mL (70 Units total) under the skin daily. 30 mL 0   ??? lamoTRIgine (LAMICTAL) 200 MG tablet Take 200 mg by mouth daily.     ??? levoFLOXacin (LEVAQUIN) 750 MG tablet Take 1 tablet (750 mg total) by mouth daily for 14 days. 14 tablet 0   ??? lidocaine-prilocaine (EMLA) 2.5-2.5 % cream Apply topically every twelve (12) hours. 30 g 0   ??? lipase-protease-amylase (CREON) 36,000-114,000- 180,000 unit CpDR TAKE 7-8 CAPSULES BY MOUTH WITH MEALS AND 5-6 CAPSULES WITH SNACKS 1800 capsule 3   ??? lisinopril (PRINIVIL,ZESTRIL) 10 MG tablet TAKE 1 TABLET BY MOUTH EVERY DAY 90 tablet 1   ??? melatonin 10 mg Tab Take 10 mg by mouth nightly.     ???  montelukast (SINGULAIR) 10 mg tablet Take 1 tablet (10 mg total) by mouth nightly. 90 tablet 3   ??? MVW COMPLETE FORMUL PROBIOTIC 40 billion cell -15 mg CpDR Take 1 capsule by mouth daily. 90 capsule 3   ??? MVW COMPLETE FORMULATION D5000 5,000-800 unit-mcg cap Take 2 capsules by mouth daily. 180 capsule 3   ??? nebulizers Misc Use as directed with inhaled medications 4 each 3   ??? omeprazole (PRILOSEC) 20 MG capsule Take 1 capsule (20 mg total) by mouth daily. 30 capsule 0   ??? pen needle, diabetic (BD ULTRA-FINE NANO PEN NEEDLE) 32 gauge x 5/32 Ndle ok to sub any brand or size needle preferred by insurance/patient, use up to 4x/day, dx E 08.9 450 each 12   ??? pramipexole (MIRAPEX) 0.125 MG tablet Take 0.25 mg by mouth daily.      ??? sodium chloride 7% 7 % Nebu Inhale 4 mL by nebulization Two (2) times a day. Increase to 4 times while taking antibiotics (Patient taking differently: Inhale 4 mL by nebulization Two (2) times a day. ) 720 mL 3   ??? tezacaftor 100mg /ivacaftor 150mg  and ivacaftor 150mg  (SYMDEKO) tablets TAKE BY MOUTH AS DIRECTED ON PACKAGE LABELING 56 each 11   ??? traZODone (DESYREL) 100 MG tablet TAKE 1 TABLET BY MOUTH NIGHTLY (Patient taking differently: Take 200 mg by mouth nightly. ) 90 tablet 3   ??? XARELTO 20 mg tablet Take 20 mg by mouth daily.       No current facility-administered medications for this visit.         Changes to medications: Christian Young reports no changes reported at this time.    Allergies   Allergen Reactions   ??? [place] [Aztreonam Lysine] Anaphylaxis   ??? Cefepime Itching and Nausea Only   ??? Other Anaphylaxis and Other (See Comments)     Other reaction(s): Other (See Comments)  Bananas: itchy throat  Slo Bid record from [employer] states anaphylaxis.????Pt states this was from childhood and does not know reaction.  Bananas, causes itchy throat   ??? Slo-Bid 100 Anaphylaxis   ??? Banana Itching and Nausea And Vomiting   ??? Tobramycin Tinnitus     From OSH record-documented as tinnitus but has received IV tobra with close monitoring.       Changes to allergies: No    SPECIALTY MEDICATION ADHERENCE     Symdeko 100/150 mg: 21 days of medicine on hand     Medication Adherence    Patient reported X missed doses in the last month:  0  Specialty Medication:  Symdeko 100/150  Patient is on additional specialty medications:  No  Support network for adherence:  family member          Specialty medication(s) dose(s) confirmed: Regimen is correct and unchanged.     Are there any concerns with adherence? No    Adherence counseling provided? Not needed    CLINICAL MANAGEMENT AND INTERVENTION      Clinical Benefit Assessment:    Do you feel the medicine is effective or helping your condition? Yes    Clinical Benefit counseling provided? Not needed    Adverse Effects Assessment:    Are you experiencing any side effects? No    Are you experiencing difficulty administering your medicine? No    Quality of Life Assessment:    How many days over the past month did your CF  keep you from your normal activities? For example, brushing your teeth or getting up in  the morning. 0    Have you discussed this with your provider? Not needed    Therapy Appropriateness:    Is therapy appropriate? Yes, therapy is appropriate and should be continued    DISEASE/MEDICATION-SPECIFIC INFORMATION      For CF patients: CF [employer] Active? Yes - active but swept    PATIENT SPECIFIC NEEDS     ? Does the patient have any physical, cognitive, or cultural barriers? No    ? Is the patient high risk? No     ? Does the patient require a Care Management Plan? No     ? Does the patient require physician intervention or other additional services (i.e. nutrition, smoking cessation, social work)? No      SHIPPING     Specialty Medication(s) to be Shipped:   CF/Pulmonary: -SYMDEKO (tezacaftor 100mg /ivacaftor 150mg  and ivacaftor 150mg ) tablets    Other medication(s) to be shipped: n/a     Changes to insurance: No    Delivery Scheduled: Yes, Expected medication delivery date: 11/05/2018.     Medication will be delivered via UPS to the confirmed home address in [facility].    The patient will receive a drug information handout for each medication shipped and additional FDA Medication Guides as required.  Verified that patient has previously received a Conservation officer, historic buildings.    Julianne Rice   [facility] Specialty Pharmacist

## 2018-11-04 MED FILL — SYMDEKO 100 MG-150 MG (DAY)/150 MG (NIGHT) TABLETS: 28 days supply | Qty: 56 | Fill #1 | Status: AC

## 2018-11-04 MED FILL — SYMDEKO 100 MG-150 MG (DAY)/150 MG (NIGHT) TABLETS: 28 days supply | Qty: 56 | Fill #1

## 2018-11-10 DIAGNOSIS — K5909 Other constipation: Principal | ICD-10-CM

## 2018-11-10 MED ORDER — PEG 3350-ELECTROLYTES 236 GRAM-22.74 GRAM-6.74 GRAM-5.86 GRAM SOLUTION: 4 L | mL | Freq: Once | 0 refills | 0 days | Status: AC

## 2018-11-10 MED ORDER — PEG 3350-ELECTROLYTES 236 GRAM-22.74 GRAM-6.74 GRAM-5.86 GRAM SOLUTION
Freq: Once | ORAL | 0 refills | 0.00000 days | Status: CP
Start: 2018-11-10 — End: 2018-11-10

## 2018-11-10 NOTE — Unmapped (Signed)
Spoke with Christian Young after his talk with the [facility] dietitian. He notes taking his enzymes correctly and they are fresh.      Took some type of medication ([employer] brand blue pill) for constipation, but wasn't sure what it was, nor found it to be effective.     Christian Young notes he might be bloated on days that he doesn't move his bowels. Says there is some pain in his lower left quadrant when he is constipated and notes early satiety.     Feels that this is consistent with constipation and is requesting a Golytely bowel prep to be prescribed to his local pharmacy, [facility].     Will route to Dr. [name] and Durenda Hurt Rupcich.    [name], RN  CF Nurse Coordinator   [phone]

## 2018-11-10 NOTE — Unmapped (Signed)
Spoke with [name] about Golytely and how is it good for 48 hours after preparation. The pharmacist might have methods of making it taste better, including keeping it in the refrigerator. Verbalized understanding. Will continue to follow-up with team after treatment.     [name], RN  CF Nurse Coordinator   [phone]

## 2018-11-10 NOTE — Unmapped (Signed)
RD received MyChart message from Christian Young re: constipation.RD spoke with Christian Young 11/10/18 re: this issue.    He reports having trouble with my bowels for ~ the last 1 week. Having a small BM every other day. Denies that stool is hard, loose, greasy. He does not feel that he has completely evacuated his bowels after having a BM. Endorses early satiety but no nausea/vomiting/bloating.     He took an over the counter laxative once ([employer] brand) ~4-5 days ago which did not help. For the last 3-4 days he has been taking miralax once daily with little success. He remembers that once at a previous CF center he completed a one day miralax clean out with good success.     RD informed Christian Young that CF team would contact him with plan.    RD to route message to [facility] nurse coordinator and pulmonary PA for review/action.

## 2018-11-11 NOTE — Unmapped (Signed)
[facility]  Adult Cystic Fibrosis Clinic    As part of [facility] CF [employer] Program, MSW mailed patient [phone] gift card for food due to financial difficulties and associated food insecurity.

## 2018-11-14 NOTE — Unmapped (Signed)
Recent:   What is the date of your last related visit?  N/A  Related acute medications Rx'd:  N/A  Home treatment tried:  N/A    Relevant:   Allergies: N/A  Medications:Albuterol nebulizer and inhaler - uses more than 4 hours  Symbicort 2 puffs BID  Zyrtec  Pulmizyme  Sodium chloride 7%  Health History: N/A   Weight: N/A       BG = 256  Reason for Disposition  ??? MODERATE difficulty breathing (e.g., speaks in phrases, SOB even at rest, pulse 100-120)    Answer Assessment - Initial Assessment Questions  1. COVID-19 DIAGNOSIS: Who made your Coronavirus (COVID-19) diagnosis? Was it confirmed by a positive lab test? If not diagnosed by a HCP, ask Are there lots of cases (community spread) where you live? (See public health department website, if unsure)    * MAJOR community spread: high number of cases; numbers of cases are increasing; many people hospitalized.    * MINOR community spread: low number of cases; not increasing; few or no people hospitalized      Not tested. Lives in [place] -19 cases  2. ONSET: When did the COVID-19 symptoms start?      Started 3.30.20  3. WORST SYMPTOM: What is your worst symptom? (e.g., cough, fever, shortness of breath, muscle aches)      Cough - frequent, highest temp = 99.5, SOB at rest and with exertion, + labored breathing  4. COUGH: How bad is the cough?        Frequent  5. FEVER: Do you have a fever? If so, ask: What is your temperature, how was it measured, and when did it start?    T max = 99.5  6. RESPIRATORY STATUS: Describe your breathing? (e.g., shortness of breath, wheezing, unable to speak)       Labored breathing, SOB at rest and with exertion  7. BETTER-SAME-WORSE: Are you getting better, staying the same or getting worse compared to yesterday?  If getting worse, ask, In what way?      Worse due to labored breathing and new onset nausea  8. HIGH RISK DISEASE: Do you have any chronic medical problems? (e.g., asthma, heart or lung disease, weak immune system, etc.)    Cystic fibrosis, asthma as a child, DM,   9. PREGNANCY: Is there any chance you are pregnant? When was your last menstrual period?      N/A  10. OTHER SYMPTOMS: Do you have any other symptoms?  (e.g., runny nose, headache, sore throat, loss of smell)    + nasal drainage, occasional headache, + sore throat, no loss of smell    Protocols used: CORONAVIRUS (COVID-19) DIAGNOSED OR SUSPECTED-A-AH

## 2018-11-14 NOTE — Unmapped (Signed)
11/14/18    Travel Screening Questions Completed.    Travel Screening Questions/Answers:  1). Have you traveled within the last 14 days?: No  2). Do you have new or worsening respiratory symptoms (e.g. cough, difficulty breathing)?: Yes  3). Have you had close contact with a person with confirmed COVID-19 in the last 14 days before symptoms began?: No      Patient answers Yes to Q2 AND/OR Q3 - [place] PATIENT: Advised Based on current recommendations, I would like you to talk with a nurse to see if you should be evaluated by a provider virtually        The call was handled in the following manner: Loaded patient into nurse triage queue. Signed and closed encounter.

## 2018-11-15 NOTE — Unmapped (Signed)
Patient communicated with CF nurse coordinator via MyChart about admission for IV antibiotics.     I am writing because I started a course of at home antibiotics I think last week, but I have been getting worse has time goes on. Shortness of breath, a cough, dizzy and just overall just weak. I have not been able to document fevers even though I am checking. I was thinking it is time to come in for a round of iv antibiotics.   ??  And when asked about home IV antibiotics and possible COVID exposure:    About starting something at home would be ify cause my dad works at [employer] and my wife well she is a Museum/gallery exhibitions officer, to my knowledge she treated someone who they thought she had it, um but other then that all I know. My taste is okay I guess I am having a upset stomach     Spoke with Dr. [name] about possible plan of care. Discussed COVID testing in [place] at the [facility] if he remains afebrile over the weekend. Should his fever or SOB worsen, we would as him to present to the [facility] ED.     When speaking with Basel, he sounded okay, not breathless on the phone. He has a hard time teasing out if this is strictly a a CF exacerbation or having different symptoms. T[place]on does note that he has been SOB before, but not to his extent. When asked what his wife (an EMT) thought of his symptoms, he said she is worried about him and asked him to call his care team. Lamine denies a true fever, states the highest it has been is 99.84F.     Asked Prabhav to call the 24/7 COVID hotline at [facility]. They could either advise to monitor symptoms, come to the ED or schedule him for testing on Monday at the [facility].     Once ruled out for COVID-19 we would work to get him admitted to [facility] for IV antibiotics for a cystic fibrosis exacerbation     [name]. [name], RN  CF Nurse Coordinator   [phone]

## 2018-11-17 ENCOUNTER — Ambulatory Visit: Admit: 2018-11-17 | Discharge: 2018-11-18 | Payer: PRIVATE HEALTH INSURANCE

## 2018-11-17 DIAGNOSIS — Z1383 Encounter for screening for respiratory disorder NEC: Principal | ICD-10-CM

## 2018-11-17 NOTE — Unmapped (Signed)
Assessment     Christian Young is a 33 y.o. male presenting to [facility] for COVID testing.     Plan     If no testing performed, pt counseled on routine care for respiratory illness.  If testing performed, COVID sent.  Patient directed to Home given findings during today's visit.    Subjective     Christian Young is a 33 y.o. male who presents to the [facility] with complaints of the following:    Exposure History: In the last 14 days?     Have you traveled outside of [place]? No                City/State Country (if outside [name])                  Have you been in close contact with someone confirmed by a test to have COVID? (Close contact is within 6 feet for at least 10 minutes) No         Worked in a health care facility?   No           Symptoms:  Do you currently have any of the following:  Subjective fever (felt feverish) Yes, how many days? 3   Chills Yes, how many days? 3   Muscle aches Yes, how many days? 3   Runny nose No   Sore Throat Yes, how many days? 3   Cough (new onset or worsening of chronic cough) Yes, how many days? 3   Shortness of breath Yes, how many days? 3   Nausea or vomiting Yes, how many days? 3   Headache Yes, how many days? 3   Abdominal Pain Yes, how many days? 3   Diarrhea (3 or more loose stools in last 24 hours) Yes, how many days? 3     History/Medical Conditions:    Do you have any of the following:   Asthma or emphysema or COPD Yes   Cystic Fibrosis Yes   Diabetes Yes   High Blood Pressure  Yes   Cardiovascular Disease No   Chronic Kidney Disease No   Chronic Liver Disease No   Chronic blood disorder like Sickle Cell Disease  No   Weak immune system due to disease or medication Yes, Which? not stated   Neurologic condition that limits movement  No   Developmental delay - Moderate to Severe  No   Recent (within past 2 weeks) or current Pregnancy No   Morbid Obesity (>100 pounds over ideal weight) No Objective     Given above, testing performed: Yes    Testing Performed:  Test Specimen Type Sent to   COVID-19  NP Swab [place] [facility]       Scribe's Attestation: AMIR H BARZIN obtained and performed the history, physical exam and medical decision making elements that were entered into the chart.  Signed by Selinda Eon, PT serving as Scribe, on 11/17/2018 12:01 PM      The documentation recorded by the scribe accurately reflects the service I personally performed and the decisions made by me. AMIR Burlene Arnt, DO

## 2018-11-18 ENCOUNTER — Ambulatory Visit
Admit: 2018-11-18 | Discharge: 2018-11-25 | Disposition: A | Discharge status: Home / Self Care | Payer: PRIVATE HEALTH INSURANCE | Admitting: Pulmonary Disease

## 2018-11-18 DIAGNOSIS — J471 Bronchiectasis with (acute) exacerbation: Principal | ICD-10-CM

## 2018-11-18 LAB — CBC W/ AUTO DIFF
BASOPHILS ABSOLUTE COUNT: 0.1 10*9/L (ref 0.0–0.1)
BASOPHILS RELATIVE PERCENT: 0.6 %
EOSINOPHILS ABSOLUTE COUNT: 0.3 10*9/L (ref 0.0–0.4)
EOSINOPHILS RELATIVE PERCENT: 3.3 %
HEMOGLOBIN: 14.8 g/dL (ref 13.5–17.5)
LARGE UNSTAINED CELLS: 2 % (ref 0–4)
LYMPHOCYTES ABSOLUTE COUNT: 2.1 10*9/L (ref 1.5–5.0)
LYMPHOCYTES ABSOLUTE COUNT: 2.1 10*9/L — ABNORMAL HIGH (ref 1.5–5.0)
LYMPHOCYTES RELATIVE PERCENT: 21.7 %
MEAN CORPUSCULAR HEMOGLOBIN CONC: 33.3 g/dL (ref 31.0–37.0)
MEAN CORPUSCULAR HEMOGLOBIN: 26.9 pg (ref 26.0–34.0)
MEAN CORPUSCULAR VOLUME: 80.7 fL (ref 80.0–100.0)
MONOCYTES ABSOLUTE COUNT: 0.6 10*9/L (ref 0.2–0.8)
MONOCYTES RELATIVE PERCENT: 5.8 %
NEUTROPHILS ABSOLUTE COUNT: 6.3 10*9/L (ref 2.0–7.5)
NEUTROPHILS RELATIVE PERCENT: 66.7 %
PLATELET COUNT: 330 10*9/L (ref 150–440)
RED BLOOD CELL COUNT: 5.5 10*12/L (ref 4.50–5.90)
RED CELL DISTRIBUTION WIDTH: 15.8 % — ABNORMAL HIGH (ref 12.0–15.0)
WBC ADJUSTED: 9.5 10*9/L (ref 4.5–11.0)

## 2018-11-18 LAB — COMPREHENSIVE METABOLIC PANEL
ALBUMIN: 4.9 g/dL (ref 3.5–5.0)
ALKALINE PHOSPHATASE: 110 U/L (ref 38–126)
ALT (SGPT): 77 U/L — ABNORMAL HIGH (ref <50)
ANION GAP: 18 mmol/L — ABNORMAL HIGH (ref 7–15)
AST (SGOT): 63 U/L — ABNORMAL HIGH (ref 19–55)
BILIRUBIN TOTAL: 0.5 mg/dL (ref 0.0–1.2)
BLOOD UREA NITROGEN: 17 mg/dL (ref 7–21)
BUN / CREAT RATIO: 25
CALCIUM: 9.8 mg/dL (ref 8.5–10.2)
CALCIUM: 9.8 mg/dL — ABNORMAL HIGH (ref 8.5–10.2)
CHLORIDE: 95 mmol/L — ABNORMAL LOW (ref 98–107)
CO2: 26 mmol/L (ref 22.0–30.0)
CREATININE: 0.67 mg/dL — ABNORMAL LOW (ref 0.70–1.30)
EGFR CKD-EPI AA MALE: >90 mL/min/{1.73_m2} (ref >=60)
EGFR CKD-EPI NON-AA MALE: >90 mL/min/{1.73_m2} (ref >=60)
GLUCOSE RANDOM: 219 mg/dL — ABNORMAL HIGH (ref 65–179)
PROTEIN TOTAL: 8.4 g/dL — ABNORMAL HIGH (ref 6.5–8.3)
SODIUM: 139 mmol/L (ref 135–145)

## 2018-11-18 LAB — PROTIME-INR: INR: 1.52

## 2018-11-18 NOTE — Unmapped (Signed)
Negative COVID19 result received.    Patient notified via telephone call.      Patient notified of the following:  Your COVID swab is negative.  This is good news, but please understand that the test is not perfect. The test may miss one in every three cases.  Because of that, it is very important that you behave as if you have the virus EVEN with a negative test.   Remain at home until you are clear and continue to self-isolate and remain at home until you have not had fever for 3 days, your respiratory symptoms have improved significantly for 3 days, and at least 7 days have passed since symptoms started.  Generally, this is at least 14 days after the start of your illness. Even after your symptoms resolve, we do recommend that you stay at home as much as possible.  Keep 6 feet of distance between you and other people. Wash your hands frequently and avoid touching your face. Please ensure that everyone in your house remain at home as much as possible and closely monitor themselves for any fever or respiratory symptoms.    Guidance on return to work should be coordinated with your employer.     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 Then throw the tissue away.  -If you don't have a tissue, use your hand to cover your cough or sneeze. Then clean your hand. You can also cough into your sleeve.  -Wash your hands often. Use soap and warm water. Wash for 15 to 20 seconds each time.  -If you don't have soap and water near you, you can clean your hands with alcohol wipes or gel.    Call your doctor now or seek immediate medical care if:    -You have a new or higher fever.     -Your fever lasts more than 48 hours.     -You have trouble breathing.     -You have a fever with a stiff neck or a severe headache.     -You are sensitive to light.     -You feel very sleepy or confused.     Advised good hand hygiene, physical distancing, and to remain home as much as possible and to call back with worsening symptoms or any new issues. Patient verbalizes understanding.    Patient is currently at [facility] hospital being admitted.

## 2018-11-18 NOTE — Unmapped (Signed)
Care Management  Initial Transition Planning Assessment    CM initial assessment completed telephonically as a precautionary measure in accordance with [facility] COVID-19 Pandemic emergency response plan. Patient verbalized understanding and agreement with completing this assessment by telephone.     33 yr old male with CF exacerbation, direct admit for IVA. Patient tells this CM that he is staying in house for full course of antibiotics. Spouse or father will pick up at discharge. Confirmed with attending that patient will be staying in house for completion of IVA.                  General  Care Manager assessed the patient by : Medical record review, Discussion with Clinical Care team  Orientation Level: Oriented X4  Who provides care at home?: N/A  Reason for referral: Discharge Planning    Contact/Decision Maker  Extended Emergency Contact Information  Primary Emergency Contact: Carmin [place]  Mobile Phone: [phone]  Relation: Spouse     Patient [phone]    Legal Next of Kin / Guardian / POA / Advance Directives       Advance Directive (Medical Treatment)  Does patient have an advance directive covering medical treatment?: Patient has advance directive covering medical treatment, copy not in chart.  Advance directive covering medical treatment not in Chart:: Copy requested from family  Information provided on advance directive:: No  Patient requests assistance:: No    Health Care Decision Maker [HCDM] (Medical & Mental Health Treatment)  Information provided on advance directive:: No  Patient requests assistance:: No    Advance Directive (Mental Health Treatment)  Does patient have an advance directive covering mental health treatment?: Patient would not like information., Patient does not have advance directive covering mental health treatment.  Reason patient does not have an advance directive covering mental health treatment:: Patient does not wish to complete one at this time.    Patient Information  Lives with: Spouse/significant other, Parent    Type of Residence: Private residence        Location/Detail: [address] [place] [address] this is a street level apartment    Support Systems: Family Members, Spouse    Responsibilities/Dependents at home?: No    Home Care services in place prior to admission?: No                  Equipment Currently Used at Home: respiratory supplies  Current HME Agency (Name/Phone #): [facility]bul[facility] vest    Currently receiving outpatient dialysis?: No       Financial Information       Need for financial assistance?: No       Social Determinants of Health  Social Determinants of Health were addressed in provider documentation.  Please refer to patient history.    Discharge Needs Assessment  Concerns to be Addressed: denies needs/concerns at this time    Clinical Risk Factors: Other (Comment)(CF)    Barriers to taking medications: No    Prior overnight hospital stay or ED visit in last 90 days: No(dc from main campus on 08/18/18)    Readmission Within the Last 30 Days: no previous admission in last 30 days         Anticipated Changes Related to Illness: none    Equipment Needed After Discharge: none    Discharge Facility/Level of Care Needs: (home self care)    Readmission  Risk of Unplanned Readmission Score: UNPLANNED READMISSION SCORE: 18%  Predictive  Model Details           19% (Medium) Factors Contributing to Score   Calculated 11/18/2018 14:18 42% Number of active Rx orders is 61   [place] Risk of Unplanned Readmission Model 19% Number of hospitalizations in last year is 4     11% Number of ED visits in last six months is 2     9% ECG/EKG order is present in last 6 months     7% Imaging order is present in last 6 months     4% Active anticoagulant Rx order is present     2% Age is 32     2% Charlson Comorbidity Index is 2     Readmitted Within the Last 30 Days? (No if blank)   Patient at risk for readmission?: Yes    Discharge Plan  Screen findings are: Discharge planning needs identified or anticipated (Comment).(CM will monitor for discharge needs that may arise)    Expected Discharge Date: 12/02/18    Expected Transfer from Critical Care: (n/a)    Patient and/or family were provided with choice of facilities / services that are available and appropriate to meet post hospital care needs?: Yes   List choices in order highest to lowest preferred, if applicable. : [facility] but he is going to be staying in house for completion of 2 weeks antibiotic, this was confirmed with atttending today as well.    Initial Assessment complete?: Yes    Christian Young  November 18, 2018 2:45 PM

## 2018-11-18 NOTE — Unmapped (Signed)
Pt with no falls or injuries this shift, and able to ambulate in room independently.  Bed low and locked, side rails up x 2, and call bell within reach.  Vitals WNL.  Pt being treated for CF exacerbation with IV antibiotics.  Pt's pain controlled with prn morphine with adequate relief.  Pt tolerating diet.  Pt to have x-ray this evening.  Nursing staff may attempt to access port again when labs are due.  This RN was able to draw labs when inserting IV this afternoon.  Will continue to monitor.    Problem: Adult Inpatient Plan of Care  Goal: Plan of Care Review  Outcome: Progressing  Goal: Patient-Specific Goal (Individualization)  Outcome: Progressing  Goal: Absence of Hospital-Acquired Illness or Injury  Outcome: Progressing  Goal: Optimal Comfort and Wellbeing  Outcome: Progressing  Goal: Readiness for Transition of Care  Outcome: Progressing  Goal: Rounds/Family Conference  Outcome: Progressing     Problem: Infection  Goal: Infection Symptom Resolution  Outcome: Progressing     Problem: Wound  Goal: Optimal Wound Healing  Outcome: Progressing

## 2018-11-18 NOTE — Unmapped (Signed)
Aminoglycoside Therapeutic Monitoring Pharmacy Note    Christian Young is a 33 y.o. male starting tobramycin. Date of therapy initiation: 11/18/18    Indication: CF exacerbation    Prior Dosing Information: Previous regimen 240 mg IV q 12 hours      Goals:  Therapeutic Drug Levels  ?? Trough level: tobramycin <1 mg/L  ?? Peak level: gentamicin 10-14 mg/L    Additional Clinical Monitoring/Outcomes  Renal function, volume status (intake and output)    Results:   ?? Not applicable   ?? Not applicable    Wt Readings from Last 1 Encounters:   10/01/18 (!) 122.5 kg (270 lb)     Lab Results   Component Value Date    CREATININE 0.67 (L) 11/18/2018       Pharmacokinetic Considerations and Significant Drug Interactions:  ? Adult (estimated initial): Vd = 31.5 L, ke = 0.3 hr-1  ? Concurrent nephrotoxic meds: Zosyn    Assessment/Plan:  Recommendation(s)  ? Start 240 mg IV q 12 hours infused over 30 minutes   ? Estimated peak and trough on recommended regimen: peak = 7.3 mg/L, trough = 0 mg/L    Follow-up  ? Level due: peak and trough levels around the fourth or fifth dose  ? A pharmacist will continue to monitor and order levels as appropriate    Please page service pharmacist with questions/clarifications.    Nino Parsley, PharmD

## 2018-11-18 NOTE — Unmapped (Signed)
PULMONARY ADMISSION  NOTE      Patient: Christian Young(11-01-1985)  Length of stay:  LOS: 0 days     Assessment and Plan:      Principal Problem:    Cystic fibrosis with pulmonary exacerbation (CMS-HCC)  Active Problems:    Essential hypertension    Depressive disorder    Mood disorder (CMS-HCC)    Anxiety    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)    Chronic pansinusitis    Pancreatic insufficiency due to cystic fibrosis (CMS-HCC)    History of Mycobacterium abscessus infection    Bronchiectasis (CMS-HCC)    Chronic deep vein thrombosis (DVT) of lower extremity (CMS-HCC)    Cystic fibrosis (CMS-HCC)    Obesity (BMI 30-39.9)    Restless leg syndrome  Resolved Problems:    * No resolved hospital problems. *    Christian Young is a 33 year old man with cystic fibrosis [phone] and [phone] insertion) c/b bronchiectasis, pancreatic insufficiency, and diabetes, s/p RULobectomy (August 2018) for M abscessus infection, HTN, MDD, and history of DVT, who presents with increased sputum production, likely due to a CF exacerbation.  ??  CF exacerbation w/ pulmonary manifestations w/ acute exacerbation of bronchiectasis: Increased cough and sputum production is concerning for a CF exacerbation. Vitals and labs stable, with the exception of tachycardia (which is normal for him). CXR pending. Recent COVID pcr is negative. Cultures in the past have grown MDR smooth Pseudomonas (I to cipro and Zosyn, S to tobramycin). Suspect that it may now be resistant to ciprofloxacin  - IV tobramycin 240 mg q12h (11/18/18 - ), dosing by Pharmacy  - IV Zosyn 4.5 g 6h (11/18/18- )  - f/u AFB and CF sputum culture  - home Symdeko  - Pulmozyme 2.5 mg BID  - montelukast 10 mg qHS  - cetirizine 10 mg daily  - Symbicort 2 puff BID or formulary equivalent   - HTS 7% QID w/ RT  - Aerobika and chest vest QID w/ RT  ??  History of Mycobacterium abscessus infection: Required RULobectomy in August 2018. Previously on clofazimine and azithromycin. Plan to repeat AFB smears every 3 months (starting February 2020). Sent today.  ??  CF-related PI  - HPHC diet  - Creon 12 capsules w/ meals, 4 w/ snacks  - MVW 2 capsules daily  ??  CF-related DM  Last A1c 8.5% (December)  - glargine 70 units qHS  - lispro 60 units TID AC  - SSI  ??  Depression, anxiety  - vfluoxetine 40 mg daily  - lamotrigine 100 mg daily  - buspirone 30 mg BID  ??  HTN  - lisinopril 10 mg daily  ??  History of DVT  - rivaroxaban 20 mg daily  ??  RLS  - pramipexole 0.25 mg daily  - gabapentin QHS to replace Horizant   ??  Code status: full code  ??  HCDM: Christian Young, wife  ??  Disposition: inpatient stay for IV antibiotics, likely to complete course at [facility]    Please page [phone] with any questions.    Christian Sander, MD       ATTENDING ADDENDUM:  On this day I was present for the key portions of the services provided and I agree with today's H&P note by Dr. Stasia Young, including the assessment and plan.    Christian Byars, MD          Subjective:      History of Present Illness:  [name] is a 33 y.o. male admitted for CF with bronchiectasis exacerbation.  Reports onset of symptoms with myalgias, night sweats, increased cough productive of green???gray sputum approximately 2 weeks ago upon return from [facility] where he was for vacation.  Underwent COVID-19 testing as an outpatient which was negative and was empirically started on levofloxacin for bronchiectasis exacerbation.  Minimal improvement with symptoms on levofloxacin and thus was called in for admission for IV antibiotics.    Last course of IV antibiotics was early January 2020.  At that time he required 21 days of tobramycin and Zosyn for bronchiectasis exacerbation.  Reports no worsening symptoms or need for antibiotics since that time up until the dose of Levaquin that was started during this current illness.  He reports no fevers at home with T-max 97 ??F, no known sick contacts or travel other than that to [facility].  His wife does work as an Museum/gallery exhibitions officer and he does report that one person in his apartment complex just died from COVID.  Because of this he wishes to complete his entire antibiotic course in-house.      He also endorses worsening sinus symptoms with sinus congestion, headaches and drainage. He does not report any anosmia.  He has had scant hemoptysis with this illness though none recently.  He has continued to be adherent with his airway clearance regimen which includes Pulmozyme twice daily, albuterol nebs, hypertonic saline nebs at 7%, and his [employer] vest.  He reports that he is fairly sedentary at baseline.    Review of Systems: A comprehensive review of systems was performed and was negative except as above in HPI  Past Medical History:   Diagnosis Date   ??? Anxiety    ??? Chronic pain disorder    ??? Cystic fibrosis (CMS-HCC)    ??? Depression    ??? Hypertension    ??? Nonproductive cough 04/05/2018     Past Surgical History:   Procedure Laterality Date   ??? PR REMOVAL OF LUNG,LOBECTOMY Right 03/29/2017    Procedure: REMOVAL OF LUNG, OTHER THAN PNEUMONECTOMY; SINGLE LOBE (LOBECTOMY);  Surgeon: Cherie Dark, MD;  Location: MA[facility];  Service: Thoracic     Medications reviewed in Epic  Allergies as of 11/18/2018 - Reviewed 11/18/2018   Allergen Reaction Noted   ??? [place] [aztreonam lysine] Anaphylaxis 12/27/2016   ??? Cefepime Itching and Nausea Only 09/06/2015   ??? Other Anaphylaxis and Other (See Comments) 09/06/2015   ??? Slo-bid 100 Anaphylaxis 06/20/2017   ??? Banana Itching and Nausea And Vomiting 07/29/2016   ??? Tobramycin Tinnitus 09/06/2015     Family History   Problem Relation Age of Onset   ??? Bipolar disorder Mother    ??? Depression Mother      Social History     Tobacco Use   ??? Smoking status: Never Smoker   ??? Smokeless tobacco: Never Used   Substance Use Topics   ??? Alcohol use: Yes     Alcohol/week: 3.0 standard drinks     Types: 3 Glasses of wine per week     Comment: 3 bottles of wine 2 days ago Objective:      Physical Exam:  Vitals:    11/18/18 1128   BP: 138/90   Pulse: 111   Resp: 20   SpO2: 97%     General: Alert, well-appearing, and in no distress.  Eyes: Anicteric sclera, conjunctiva clear.  ENT:  Mucous membranes moist and intact.  Lungs: Normal excursion, no  dullness to percussion. Good air movement bilaterally, without wheezes or crackles. Normal upper airway sounds without evidence of stridor.  Cardiovascular: tachycardic, normal S1/S2, no MRG  Abdomen: Soft, non-tender, not distended, bowel sounds are normal, liver is not enlarged, spleen is not enlarged  Skin: No rashes or lesions.  Neuro: No focal neurological deficits.    Malnutrition Assessment by RD:          Diagnostic Review:   All labs and images were personally reviewed.

## 2018-11-19 LAB — BASIC METABOLIC PANEL
ANION GAP: 16 mmol/L — ABNORMAL HIGH (ref 7–15)
BLOOD UREA NITROGEN: 18 mg/dL (ref 7–21)
BUN / CREAT RATIO: 21
CALCIUM: 9.4 mg/dL (ref 8.5–10.2)
CHLORIDE: 97 mmol/L — ABNORMAL LOW (ref 98–107)
CO2: 26 mmol/L (ref 22.0–30.0)
CREATININE: 0.86 mg/dL (ref 0.70–1.30)
EGFR CKD-EPI NON-AA MALE: >90 mL/min/{1.73_m2} (ref >=60)
EGFR CKD-EPI NON-AA MALE: >90 mL/min/1.73m2 — ABNORMAL HIGH (ref >=60–10.2)
GLUCOSE RANDOM: 166 mg/dL (ref 65–179)
SODIUM: 139 mmol/L (ref 135–145)

## 2018-11-19 NOTE — Unmapped (Signed)
No complications or distress noted

## 2018-11-19 NOTE — Unmapped (Signed)
Pt alert and oriented. VSS. IV abx administered. Oxy given x1 for right sided chest pain. Care explained. Isolation precautions maintained. Bed lowered and call light in reach. Will continue to monitor.     Problem: Adult Inpatient Plan of Care  Goal: Absence of Hospital-Acquired Illness or Injury  Outcome: Ongoing - Unchanged  Intervention: Identify and Manage Fall Risk  Flowsheets (Taken 11/18/2018 2317)  Safety Interventions:  ??? bed alarm  ??? fall reduction program maintained  ??? isolation precautions  ??? low bed  ??? room near unit station  Intervention: Prevent Skin Injury  Flowsheets (Taken 11/18/2018 2317)  Pressure Reduction Techniques: frequent weight shift encouraged  Intervention: Prevent VTE (venous thromboembolism)  Flowsheets (Taken 11/18/2018 2318)  VTE Prevention/Management: ambulation promoted  Intervention: Prevent Infection  Flowsheets (Taken 11/18/2018 2318)  Infection Prevention:  ??? rest/sleep promoted  ??? single patient room provided  ??? personal protective equipment utilized  ??? handwashing promoted  ??? visitors restricted/screened  Goal: Optimal Comfort and Wellbeing  Outcome: Ongoing - Unchanged  Intervention: Monitor Pain and Promote Comfort  Flowsheets (Taken 11/18/2018 2318)  Pain Management Interventions:  ??? care clustered  ??? pain management plan reviewed with patient/caregiver  ??? quiet environment facilitated  Intervention: Provide Person-Centered Care  Flowsheets (Taken 11/18/2018 2318)  Trust Relationship/Rapport:  ??? choices provided  ??? care explained  ??? emotional support provided  ??? questions answered  ??? empathic listening provided  ??? questions encouraged  ??? reassurance provided     Problem: Infection  Goal: Infection Symptom Resolution  Outcome: Ongoing - Unchanged  Intervention: Prevent or Manage Infection  Flowsheets (Taken 11/18/2018 2318)  Isolation Precautions: contact precautions maintained     Problem: Diabetes Comorbidity  Goal: Blood Glucose Level Within Desired Range  Outcome: Ongoing - Unchanged  Intervention: Maintain Glycemic Control  Flowsheets (Taken 11/18/2018 2318)  Glycemic Management: blood glucose monitoring     Problem: Hypertension Comorbidity  Goal: Blood Pressure in Desired Range  Outcome: Ongoing - Unchanged  Intervention: Maintain Hypertension-Management Strategies  Flowsheets (Taken 11/18/2018 2318)  Syncope Management: position changed slowly  Medication Review/Management: medications reviewed

## 2018-11-19 NOTE — Unmapped (Signed)
Adult Inpatient CF Initial Assessment        Christian Young is a 33 y.o. male who was admitted for cf exacerbation.    Heart Rate: 108  Respiratory Rate: 20  Bilateral Breath Sounds: BBS diminished  SPO2: 95% RA   Cough/Sputum: How Much? Not at this time                              Color:    Are they coughing up blood? Yes:  Home                                                      No: X Not here                                                     How Much?    Home medication & frequency :      Hypertonic Saline 7% BID  Pulmozyme 2.5mg   BID day    Home airway clearance & frequency:    Vest   Aerobika  Times Chosen for Inhaled Medications and Airway Clearance: 10am      Methods tried & found to be ineffective:     Accapella / vest / metaneb    Home O2? Yes:                       No: X  If yes, what is their home requirement:     Home non-invasive ventilation? Yes:                                                          No: X  If yes, enter type of device, settings, & mode:    Airway clearance technique method offered & decided upon: Vest     Comments:

## 2018-11-19 NOTE — Unmapped (Signed)
PULMONARY PROGRESS NOTE      Patient: Christian Young(1986-03-02)  Reason for admission: Cystic fibrosis with pulmonary exacerbation (CMS-HCC)     Assessment and Recommendations:      Principal Problem:    Cystic fibrosis with pulmonary exacerbation (CMS-HCC)  Active Problems:    Essential hypertension    Depressive disorder    Mood disorder (CMS-HCC)    Anxiety    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)    Chronic pansinusitis    Pancreatic insufficiency due to cystic fibrosis (CMS-HCC)    History of Mycobacterium abscessus infection    Bronchiectasis (CMS-HCC)    Chronic deep vein thrombosis (DVT) of lower extremity (CMS-HCC)    Cystic fibrosis (CMS-HCC)    Obesity (BMI 30-39.9)    Restless leg syndrome  Resolved Problems:    * No resolved hospital problems. *    Christian Young??is a 32 year old man with cystic fibrosis [phone] and [phone] insertion) c/b bronchiectasis, pancreatic insufficiency, and diabetes, s/p RULobectomy (August 2018) for M abscessus infection, HTN, MDD, and history of DVT, who presents with increased sputum production, likely due to a CF exacerbation.  ??  CF exacerbation w/ pulmonary manifestations w/ acute exacerbation of bronchiectasis: Increased cough and sputum production is concerning for a CF exacerbation. Vitals and labs stable, with the exception of tachycardia (which is normal for him). CXR pending. Recent COVID pcr is negative. Cultures in the past have grown MDR smooth Pseudomonas (I to cipro and Zosyn, S to tobramycin). Suspect that it may now be resistant to ciprofloxacin  - IV tobramycin 240 mg q12h (11/18/18 - ), dosing by Pharmacy  - IV Zosyn 4.5 g 6h (11/18/18- )  - f/u AFB and CF sputum culture  - home Symdeko  - Pulmozyme 2.5 mg BID  - montelukast 10 mg qHS  - cetirizine 10 mg daily  - Symbicort 2 puff BID or formulary equivalent   - HTS 7% QID w/ RT  - Aerobika and chest vest QID w/ RT  ??  History of Mycobacterium abscessus infection: Required RULobectomy in August 2018. Previously on clofazimine and azithromycin. Plan to repeat AFB smears every 3 months (starting February 2020).   - AFB smear and culture today  ??  CF-related PI  - HPHC diet  - Creon 12 capsules w/ meals, 4 w/ snacks  - MVW 2 capsules daily  ??  CF-related DM  Last A1c 8.5% (December)  - glargine 70 units qHS  - lispro 60 units TID AC  - SSI  ??  Depression, anxiety  - fluoxetine 40 mg daily  - lamotrigine 100 mg daily  ??  HTN  - lisinopril 10 mg daily  ??  History of DVT  - rivaroxaban 20 mg daily  ??  RLS  - pramipexole 0.25 mg daily  - gabapentin QHS to replace Horizant   ??  Code??status:??full code  ??  HCDM:??Alyson Cordts, wife  ??  Disposition:??inpatient stay for IV antibiotics, likely to complete course at [facility].    Please page [phone] with any questions.      Guido Sander, MD    Subjective:      Interval History (11/19/18)  No acute events overnight. Unable to access port yesterday. PIV obtained and functioning.       Objective:      Physical Exam:  Vitals:    11/18/18 1826 11/18/18 2021 11/19/18 0754 11/19/18 0900   BP:  123/76 114/56    Pulse:  106 103 101  Resp:  16 16 18    Temp:  36.9 ??C 36.7 ??C    TempSrc:  Oral Oral    SpO2:  96% 94% 95%   Weight: (!) 117.4 kg (258 lb 12.8 oz)      Height: 182.9 cm (6')        General: Alert, well-appearing, and in no distress.  Eyes: Anicteric sclera, conjunctiva clear.  ENT:  Mucous membranes moist and intact.  Lungs: Normal excursion, no dullness to percussion. Good air movement bilaterally, without wheezes or crackles. Normal upper airway sounds without evidence of stridor.  Cardiovascular: tachycardic, normal S1/S2, no MRG  Abdomen: Soft, non-tender, not distended, bowel sounds are normal, liver is not enlarged, spleen is not enlarged  Skin: No rashes or lesions.  Neuro: No focal neurological deficits.    Malnutrition Assessment using AND/ASPEN Clinical Characteristics:                                       Patient Lines/Drains/Airways Status    Active Active Lines, Drains, & Airways     Name:   Placement date:   Placement time:   Site:   Days:    Port A Cath 05/30/17 Right Chest   05/30/17    1746    Chest   537    Power Port--a-Cath Single Hub 10/05/15 Right Chest   10/05/15    2000    Chest   1140    Peripheral IV 11/18/18 Left Forearm   11/18/18    1600    Forearm   less than 1              Patient Lines/Drains/Airways Status    Active Wounds     Name:   Placement date:   Placement time:   Site:   Days:    Surgical Site 03/29/17 Chest Right   03/29/17    1346     599    Surgical Site Axilla Right   ???    ???                        Diagnostic Review:   All labs and images were personally reviewed.

## 2018-11-20 LAB — BASIC METABOLIC PANEL
ANION GAP: 14 mmol/L (ref 7–15)
BLOOD UREA NITROGEN: 11 mg/dL (ref 7–21)
BUN / CREAT RATIO: 14
CALCIUM: 8.8 mg/dL (ref 8.5–10.2)
CHLORIDE: 98 mmol/L (ref 98–107)
CO2: 25 mmol/L (ref 22.0–30.0)
EGFR CKD-EPI NON-AA MALE: >90 mL/min/{1.73_m2} (ref >=60)
GLUCOSE RANDOM: 223 mg/dL — ABNORMAL HIGH (ref 65–179)
POTASSIUM: 4.2 mmol/L (ref 3.5–5.0)
SODIUM: 137 mmol/L (ref 135–145)

## 2018-11-20 LAB — MAGNESIUM: MAGNESIUM: 1.7 mg/dL (ref 1.6–2.2)

## 2018-11-20 NOTE — Unmapped (Signed)
Pt alert and oriented x4. Pt reports chronic pain to L chest related to coughing. Pain managed with prn medication and with effect. IVABX given per order. IV meds given via PIV. Pt has port. Site not accessed at this time as there were multiple failed attempts at accessing site yesterday. Pt reports he would like to keep PIV at this time. Pt up to chair this morning. Observed sleeping in the afternoon. Pt independent of care. VSS and pt in NAD.       Problem: Adult Inpatient Plan of Care  Goal: Plan of Care Review  Outcome: Progressing  Goal: Patient-Specific Goal (Individualization)  Outcome: Progressing  Goal: Absence of Hospital-Acquired Illness or Injury  Outcome: Progressing  Goal: Optimal Comfort and Wellbeing  Outcome: Progressing  Goal: Readiness for Transition of Care  Outcome: Progressing  Goal: Rounds/Family Conference  Outcome: Progressing     Problem: Infection  Goal: Infection Symptom Resolution  Outcome: Progressing     Problem: Wound  Goal: Optimal Wound Healing  Outcome: Progressing     Problem: Diabetes Comorbidity  Goal: Blood Glucose Level Within Desired Range  Outcome: Progressing     Problem: Hypertension Comorbidity  Goal: Blood Pressure in Desired Range  Outcome: Progressing

## 2018-11-20 NOTE — Unmapped (Signed)
POC reviewed with patient at start of shift. VSS with exception of HR 100-115 BPM. Afebrile.  Pt SOB at start of shift but this was resolved with PRN albuterol MDI.  Abx administered per order.  Pt on contact precautions.  C/O pain to ribs/chest that is controlled with oxycodone. Will continue to monitor.  Problem: Adult Inpatient Plan of Care  Goal: Plan of Care Review  Outcome: Progressing  Goal: Patient-Specific Goal (Individualization)  Outcome: Progressing  Goal: Absence of Hospital-Acquired Illness or Injury  Outcome: Progressing  Goal: Optimal Comfort and Wellbeing  Outcome: Progressing  Goal: Readiness for Transition of Care  Outcome: Progressing  Goal: Rounds/Family Conference  Outcome: Progressing     Problem: Infection  Goal: Infection Symptom Resolution  Outcome: Progressing     Problem: Wound  Goal: Optimal Wound Healing  Outcome: Progressing     Problem: Diabetes Comorbidity  Goal: Blood Glucose Level Within Desired Range  Outcome: Progressing     Problem: Hypertension Comorbidity  Goal: Blood Pressure in Desired Range  Outcome: Progressing

## 2018-11-20 NOTE — Unmapped (Signed)
Pt alert and oriented x4. Pain managed with prn tylenol. Pt declined wanting oxycodone. IVABX continued via PIV. 1st tobra level drawn per order. 2nd level to be drawn this afternoon. Awaiting another sputum sample. Pt in NAD and VSS.       Problem: Adult Inpatient Plan of Care  Goal: Plan of Care Review  Outcome: Progressing  Goal: Patient-Specific Goal (Individualization)  Outcome: Progressing  Goal: Absence of Hospital-Acquired Illness or Injury  Outcome: Progressing  Goal: Optimal Comfort and Wellbeing  Outcome: Progressing  Goal: Readiness for Transition of Care  Outcome: Progressing  Goal: Rounds/Family Conference  Outcome: Progressing     Problem: Infection  Goal: Infection Symptom Resolution  Outcome: Progressing     Problem: Wound  Goal: Optimal Wound Healing  Outcome: Progressing     Problem: Diabetes Comorbidity  Goal: Blood Glucose Level Within Desired Range  Outcome: Progressing     Problem: Hypertension Comorbidity  Goal: Blood Pressure in Desired Range  Outcome: Progressing

## 2018-11-20 NOTE — Unmapped (Signed)
Pt received scheduled tx's as ordered without any c/o discomfort, pt continues to use home chest vest after tx's and no assistance needed.RT will continue to follow

## 2018-11-20 NOTE — Unmapped (Signed)
Adult Nutrition Assessment Note    This patient was not seen in person. The clinical nutrition service has moved to a liaison model to minimize potential spread of COVID-19, protect patients/providers and reduce PPE utilization.  During this time, we will be limiting person-to-person contact when possible.    Visit Type: RN Consult  Reason for Visit: Per Admission Nutrition Screen (Adult), Have you gained or lost 10 pounds in the past 3 months?    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  Nutrition Hx: RD spoke with patient this morning via the phone due to COVID-19 restrictions in order to conserve PPE.  Patient states that he had intended to lose some weight, but has not put forth any effort so he's unsure as to where the weight loss came from.  Patient states that his appetite at home was okay and eating 2-3 meals per day, but they aren't very big.  Appetite has decreased over the last week by ~50%.  Patient states that he usually does not eat breakfast and doesn't really snack.  Lunch is typically whatever I can find, pizza rolls, leftovers.  Dinner may consist of a home cooked (chicken or spaghetti or tacos) or take out.  Patient has been getting 3 meals per day while inpatient and eating close to 100%.  Patient does not use any nutrition supplements at home.  Patient endorsed compliance with enzymes an vitamins.  Patient states enzyme dosage at home is 8 with meals and 4 with snacks, but again patient states that he does not snack a lot.  Patient denies the need for nutrition supplements at this time.  Patient endorsed a UBW: (260-270)#, but is unsure the last time he weighed this.    Nutritionally Pertinent Meds: Protonix, MVW complete (pediatric MVI), insulin, melatonin, Creon  Labs: POC glucose x 24hrs (118-158)mg/dL  Abd/GI: Denies N/V.  Endorsed diarrhea PTA and currently.    Skin: wound report reviewed  Patient Lines/Drains/Airways Status    Active Wounds     Name:   Placement date:   Placement time:   Site:   Days:    Surgical Site 03/29/17 Chest Right   03/29/17    1346     600    Surgical Site Axilla Right   ???    ???                    Current nutrition therapy order:   Nutrition Orders          Nutrition Therapy High Calorie High Protein starting at 04/07 1136           Anthropometric Data:  -- Height: 182.9 cm (6')   -- Last recorded weight: (!) 117.4 kg (258 lb 12.8 oz)  -- Admission weight: (!) 117.4 kg (258 lb 12.8 oz)  -- IBW: 80.79 kg  -- Percent IBW: 145.3 %  -- BMI: Body mass index is 35.1 kg/m??.   -- Weight changes this admission:   Last 5 Recorded Weights    11/18/18 1826   Weight: (!) 117.4 kg (258 lb 12.8 oz)      -- Weight history PTA: -4.2kg (3.5%) in ~3 months, -5.1kg (4.2%) in ~7 weeks  Wt Readings from Last  10 Encounters:   11/18/18 (!) 117.4 kg (258 lb 12.8 oz)   10/01/18 (!) 122.5 kg (270 lb)   08/14/18 (!) 120.7 kg (266 lb 3.2 oz)   06/19/18 (!) 121.6 kg (268 lb)   06/19/18 (!) 122.6 kg (270 lb 4.8 oz)   04/09/18 (!) 120 kg (264 lb 8.8 oz)   03/17/18 (!) 120.2 kg (265 lb)   12/29/17 (!) 119.2 kg (262 lb 12.6 oz)   10/23/17 (!) 123.9 kg (273 lb 3.2 oz)   10/08/17 (!) 124 kg (273 lb 4.8 oz)        Daily Estimated Nutrient Needs:   Energy: 9528-4132 kcals [(24-28 kcal/kg) using admission body weight, 117.4 kg (11/20/18 1139)]  Protein: 141-188 gm [1.5-2 X DRI using admission body weight, 117.4 kg (11/20/18 1139)]  Carbohydrate:   [45-60% of kcal(optimize blood sugar control - carbohydrates are usually not restricted in this population)]  Fluid: 3448 [Per Holliday-Segar Method]     Nutrition Focused Physical Exam:       Deferred due to COVID-19 restrictions and associated efforts to conserve PPE.             DIAGNOSIS:  Malnutrition Assessment using AND/ASPEN Clinical Characteristics:     Patient meets criteria for severe malnutrition in the context of acute illness based on recent intake, but not fur weight loss.  RD cannot definitely conclude this diagnosis in the absence of NFPE.                       Overall nutrition impression: Patient likely not meeting estimated needs PTA due to decreased intake/appetite with subsequent weight loss.  Weight documentation does not reveal any significant losses.  Patient does endorse the desire to lose weight, but more recent weight loss is not intentional.  Patient is likely currently meeting estimated needs without the use of nutrition supplements.  Current enzyme dosing 1635 units/meal does not exceed max dosing.  Unable to determine malnutrition diagnosis without NFPE.      GOALS:  Oral Intake:       - Patient to consume 75% of 3 meals per day.  Anthropometric:       - Prevent further unintended weight loss  Laboratory Data:       - Blood Glucose and/or A1C values trend towards normal limits.     RECOMMENDATIONS AND INTERVENTIONS:  1.  Recommend to continue current diet as ordered: High Calorie/High Protein  2. Meet estimated daily needs: 2818 kcals, (141-188)g pro; 3448 mL free water       Calories estimated using: 24-28 kcal/kg using actual body weight, protein per DRI x 1.5-2, and fluid per Holiday Segar  3. Normal fat-soluble vitamin levels: Vitamin A, Vitamin E and PT per lab range; Vitamin D 25OH total >30  4. Maintain glucose control. Carbohydrate content of diet should comprise 40-50% of total calorie needs, but carbohydrates are not restricted in this population.    5. Meet sodium needs for CF.     RD Follow Up Parameters:  1-2 times per week (and more frequent as indicated)     I appreciate the opportunity to participate in the care of this patient.  Please contact me with any questions.    Terrence Dupont, MS, RD, LDN  Pager: [phone]

## 2018-11-20 NOTE — Unmapped (Signed)
PULMONARY PROGRESS NOTE      Patient: Christian Young(Dec 22, 1985)  Reason for admission: Cystic fibrosis with pulmonary exacerbation (CMS-HCC)     Assessment and Recommendations:      Principal Problem:    Cystic fibrosis with pulmonary exacerbation (CMS-HCC)  Active Problems:    Essential hypertension    Depressive disorder    Mood disorder (CMS-HCC)    Anxiety    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)    Chronic pansinusitis    Pancreatic insufficiency due to cystic fibrosis (CMS-HCC)    History of Mycobacterium abscessus infection    Bronchiectasis (CMS-HCC)    Chronic deep vein thrombosis (DVT) of lower extremity (CMS-HCC)    Cystic fibrosis (CMS-HCC)    Obesity (BMI 30-39.9)    Restless leg syndrome  Resolved Problems:    * No resolved hospital problems. 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  ??  CF exacerbation w/ pulmonary manifestations w/ acute exacerbation of bronchiectasis: Increased cough and sputum production is concerning for a CF exacerbation. Vitals and labs stable, with the exception of tachycardia (which is normal for him). CXR pending. Recent COVID pcr is negative. Cultures in the past have grown MDR smooth Pseudomonas (I to cipro and Zosyn, S to tobramycin). Suspect that it may now be resistant to ciprofloxacin  - IV tobramycin 240 mg q12h (11/18/18 - ), dosing by Pharmacy  - IV Zosyn 4.5 g 6h (11/18/18- )  - f/u AFB and CF sputum culture  - home Symdeko  - Pulmozyme 2.5 mg BID  - montelukast 10 mg qHS  - cetirizine 10 mg daily  - Symbicort 2 puff BID or formulary equivalent   - HTS 7% QID w/ RT  - Aerobika and chest vest QID w/ RT  ??  History of Mycobacterium abscessus infection: Required RULobectomy in August 2018. Previously on clofazimine and azithromycin. Plan to repeat AFB smears every 3 months (starting February 2020).   - AFB smear and culture today  ??  CF-related PI  - HPHC diet  - Creon 12 capsules w/ meals, 4 w/ snacks  - MVW 2 capsules daily  ??  CF-related DM  Last A1c 8.5% (December)  - glargine 70 units qHS  - lispro 60 units TID AC  - SSI  ??  Depression, anxiety  - fluoxetine 40 mg daily  - lamotrigine 100 mg daily  ??  HTN  - lisinopril 10 mg daily  ??  History of DVT  - rivaroxaban 20 mg daily  ??  RLS  - pramipexole 0.25 mg daily  - gabapentin QHS to replace Horizant   ??  Code??status:??full code  ??  HCDM:??Alyson Petrik, wife  ??  Disposition:??inpatient stay for IV antibiotics, likely to complete course at [facility].    Please page [phone] with any questions.      Guido Sander, MD    Subjective:      Interval History (11/20/18)  No acute events overnight. Happy with PIV for now. Glucose well controlled on home regimen.        Objective:      Physical Exam:  Vitals:    11/19/18 0900 11/19/18 1935 11/19/18 2000 11/19/18 2059   BP:  115/60     Pulse: 101  107  116   Resp: 18 20  18    Temp:   36.2 ??C    TempSrc:   Axillary    SpO2: 95% 95%  95%   Weight:       Height:         General: Alert, well-appearing, and in no distress.  Eyes: Anicteric sclera, conjunctiva clear.  ENT:  Mucous membranes moist and intact.  Lungs: respirations are even and unlabored  Cardiovascular: well perfused  Abdomen: non-distended  Skin: No rashes or lesions.  Neuro: No gross neurologic deficits.    Malnutrition Assessment using AND/ASPEN Clinical Characteristics:                                       Patient Lines/Drains/Airways Status    Active Active Lines, Drains, & Airways     Name:   Placement date:   Placement time:   Site:   Days:    Port A Cath 05/30/17 Right Chest   05/30/17    1746    Chest   538    Power Port--a-Cath Single Hub 10/05/15 Right Chest   10/05/15    2000    Chest   1141    Peripheral IV 11/18/18 Left Forearm   11/18/18    1600    Forearm   1              Patient Lines/Drains/Airways Status    Active Wounds     Name:   Placement date:   Placement time:   Site:   Days:    Surgical Site 03/29/17 Chest Right   03/29/17    1346     600    Surgical Site Axilla Right   ???    ???                        Diagnostic Review:   All labs and images were personally reviewed.

## 2018-11-21 LAB — BASIC METABOLIC PANEL
ANION GAP: 13 mmol/L (ref 7–15)
BLOOD UREA NITROGEN: 11 mg/dL (ref 7–21)
BUN / CREAT RATIO: 13
CALCIUM: 9.1 mg/dL (ref 8.5–10.2)
CHLORIDE: 100 mmol/L (ref 98–107)
CO2: 28 mmol/L (ref 22.0–30.0)
CREATININE: 0.82 mg/dL (ref 0.70–1.30)
EGFR CKD-EPI NON-AA MALE: >90 mL/min/{1.73_m2} (ref >=60)
GLUCOSE RANDOM: 102 mg/dL (ref 65–179)
SODIUM: 141 mmol/L (ref 135–145)

## 2018-11-21 NOTE — Unmapped (Signed)
PULMONARY PROGRESS NOTE      Patient: Thaddaeus Granja Badia(31-Jul-1986)  Reason for admission: Cystic fibrosis with pulmonary exacerbation (CMS-HCC)     Assessment and Recommendations:      Principal Problem:    Cystic fibrosis with pulmonary exacerbation (CMS-HCC)  Active Problems:    Essential hypertension    Depressive disorder    Mood disorder (CMS-HCC)    Anxiety    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)    Chronic pansinusitis    Pancreatic insufficiency due to cystic fibrosis (CMS-HCC)    History of Mycobacterium abscessus infection    Bronchiectasis (CMS-HCC)    Chronic deep vein thrombosis (DVT) of lower extremity (CMS-HCC)    Cystic fibrosis (CMS-HCC)    Obesity (BMI 30-39.9)    Restless leg syndrome  Resolved Problems:    * No resolved hospital problems. 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  ??  CF exacerbation w/ pulmonary manifestations w/ acute exacerbation of bronchiectasis: Increased cough and sputum production is concerning for a CF exacerbation. Vitals and labs stable, with the exception of tachycardia (which is normal for him). CXR pending. Recent COVID pcr is negative. Cultures in the past have grown MDR smooth Pseudomonas (I to cipro and Zosyn, S to tobramycin). Suspect that it may now be resistant to ciprofloxacin  - IV tobramycin 240 mg q12h (11/18/18 - ), dosing by Pharmacy  - IV Zosyn 4.5 g 6h (11/18/18- )  - f/u AFB and CF sputum culture  - home Symdeko  - Pulmozyme 2.5 mg BID  - montelukast 10 mg qHS  - cetirizine 10 mg daily  - Symbicort 2 puff BID or formulary equivalent   - HTS 7% QID w/ RT  - Aerobika and chest vest QID w/ RT  ??  History of Mycobacterium abscessus infection: Required RULobectomy in August 2018. Previously on clofazimine and azithromycin. Plan to repeat AFB smears every 3 months (starting February 2020).   - AFB smear and culture today  ??  CF-related PI  - HPHC diet  - Creon 12 capsules w/ meals, 4 w/ snacks  - MVW 2 capsules daily  ??  CF-related DM  Last A1c 8.5% (December)  - glargine 70 units qHS  - lispro 60 units TID AC  - SSI  ??  Depression, anxiety  - fluoxetine 40 mg daily  - lamotrigine 100 mg daily  ??  HTN  - lisinopril 10 mg daily  ??  History of DVT  - rivaroxaban 20 mg daily  ??  RLS  - pramipexole 0.25 mg daily  - gabapentin QHS to replace Horizant   ??  Code??status:??full code  ??  HCDM:??Alyson Golubski, wife  ??  Disposition:??inpatient stay for IV antibiotics, likely to complete course at [facility].    Please page [phone] with any questions.      Guido Sander, MD    Subjective:      Interval History (11/21/18)  No acute events overnight. Poor sleep in the hospital. Happy with PIV for now. Glucose well controlled on home regimen. Ongoing chest pain with cough (chronic during exacerbations), currently well controlled with occasional oxy, acetaminophen.       Objective:      Physical Exam:  Vitals:  11/19/18 2059 11/20/18 0855 11/20/18 1913 11/20/18 2018   BP:  111/61 125/69    Pulse: 116 99 100 109   Resp: 18 18 18 18    Temp:  36.6 ??C 37.3 ??C    TempSrc:  Oral Oral    SpO2: 95% 94% 95% 95%   Weight:       Height:         General: Alert, well-appearing, and in no distress.  Eyes: Anicteric sclera, conjunctiva clear.  ENT:  Mucous membranes moist and intact.  Lungs: respirations are even and unlabored, CTAB  Cardiovascular: well perfused, RRR no MRG  Abdomen: non-distended  Skin: No rashes or lesions.  Neuro: No gross neurologic deficits.    Malnutrition Assessment using AND/ASPEN Clinical Characteristics:                                       Patient Lines/Drains/Airways Status    Active Active Lines, Drains, & Airways     Name:   Placement date:   Placement time:   Site: Days:    Port A Cath 05/30/17 Right Chest   05/30/17    1746    Chest   539    Power Port--a-Cath Single Hub 10/05/15 Right Chest   10/05/15    2000    Chest   1142    Peripheral IV 11/18/18 Left Forearm   11/18/18    1600    Forearm   2              Patient Lines/Drains/Airways Status    Active Wounds     Name:   Placement date:   Placement time:   Site:   Days:    Surgical Site 03/29/17 Chest Right   03/29/17    1346     601    Surgical Site Axilla Right   ???    ???                        Diagnostic Review:   All labs and images were personally reviewed.

## 2018-11-21 NOTE — Unmapped (Signed)
Aminoglycoside Therapeutic Monitoring Pharmacy Note    Christian Young is a 33 y.o. male continuing tobramycin. Date of therapy initiation: 11/18/18    Indication: CF exacerbation    Prior Dosing Information: Current regimen 240 mg IV q 12 hours      Goals:  Therapeutic Drug Levels  ?? Trough level: tobramycin <1 mg/L  ?? Peak level: gentamicin 10-14 mg/L    Additional Clinical Monitoring/Outcomes  Renal function, volume status (intake and output)    Results:   ?? peak level: 6.8 mg/L (extrapolated peak: 10 mg/L)    ?? random level: 0.9 mg/L (extrapolated trough: 0.2 mg/L)    Wt Readings from Last 1 Encounters:   11/18/18 (!) 117.4 kg (258 lb 12.8 oz)     Lab Results   Component Value Date    CREATININE 0.78 11/20/2018       Pharmacokinetic Considerations and Significant Drug Interactions:  ? Adult (calculated on 11/20/18): Vd = 22.6 L, ke = 0.34 hr-1  ? Concurrent nephrotoxic meds: zosyn    Assessment/Plan:  Recommendation(s)  ? Continue current regimen of 240 mg IV q 12 hours over 30 minutes   ? Estimated peak and trough on recommended regimen: peak = 10 mg/L, trough = 0.2 mg/L    Follow-up  ? Level due: in 3-5 days  ? A pharmacist will continue to monitor and order levels as appropriate    Please page service pharmacist with questions/clarifications.    Nino Parsley, PharmD

## 2018-11-21 NOTE — Unmapped (Signed)
POC reviewed with patient at start of shift. VSS with exception of HR 100-115 BPM. Afebrile.  Pt SOB at start of shift but this was resolved with PRN albuterol MDI.  Abx administered per order.  Pt on contact precautions.  C/O pain to ribs/chest that is controlled with oxycodone. Will continue to monitor.  Problem: Adult Inpatient Plan of Care  Goal: Plan of Care Review  Outcome: Progressing  Goal: Patient-Specific Goal (Individualization)  Outcome: Progressing  Goal: Absence of Hospital-Acquired Illness or Injury  Outcome: Progressing  Goal: Optimal Comfort and Wellbeing  Outcome: Progressing  Goal: Readiness for Transition of Care  Outcome: Progressing  Goal: Rounds/Family Conference  Outcome: Progressing     Problem: Infection  Goal: Infection Symptom Resolution  Outcome: Progressing     Problem: Wound  Goal: Optimal Wound Healing  Outcome: Progressing     Problem: Diabetes Comorbidity  Goal: Blood Glucose Level Within Desired Range  Outcome: Progressing     Problem: Hypertension Comorbidity  Goal: Blood Pressure in Desired Range  Outcome: Progressing

## 2018-11-22 LAB — BASIC METABOLIC PANEL
ANION GAP: 15 mmol/L (ref 7–15)
BLOOD UREA NITROGEN: 13 mg/dL (ref 7–21)
BUN / CREAT RATIO: 15
CALCIUM: 9.3 mg/dL (ref 8.5–10.2)
CHLORIDE: 100 mmol/L (ref 98–107)
CO2: 26 mmol/L (ref 22.0–30.0)
CREATININE: 0.85 mg/dL (ref 0.70–1.30)
EGFR CKD-EPI NON-AA MALE: >90 mL/min/{1.73_m2} (ref >=60)
GLUCOSE RANDOM: 104 mg/dL (ref 65–179)
GLUCOSE RANDOM: 104 mg/dL (ref 65–179)
POTASSIUM: 4.6 mmol/L (ref 3.5–5.0)
SODIUM: 141 mmol/L (ref 135–145)

## 2018-11-22 NOTE — Unmapped (Signed)
PULMONARY PROGRESS NOTE      Patient: Christian Young(23-Sep-1985)  Reason for admission: Cystic fibrosis with pulmonary exacerbation (CMS-HCC)     Assessment and Recommendations:      Principal Problem:    Cystic fibrosis with pulmonary exacerbation (CMS-HCC)  Active Problems:    Essential hypertension    Depressive disorder    Mood disorder (CMS-HCC)    Anxiety    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)    Chronic pansinusitis    Pancreatic insufficiency due to cystic fibrosis (CMS-HCC)    History of Mycobacterium abscessus infection    Bronchiectasis (CMS-HCC)    Chronic deep vein thrombosis (DVT) of lower extremity (CMS-HCC)    Cystic fibrosis (CMS-HCC)    Obesity (BMI 30-39.9)    Restless leg syndrome  Resolved Problems:    * No resolved hospital problems. *    Christian Young??is a 33 year old man with cystic fibrosis [phone] and [phone] insertion) c/b bronchiectasis, pancreatic insufficiency, and diabetes, s/p RULobectomy (August 2018) for M abscessus infection, HTN, MDD, and history of DVT, who presents with??increased sputum production, likely due to a CF exacerbation.  ??  CF exacerbation w/ pulmonary manifestations w/ acute exacerbation of bronchiectasis:??Increased cough and sputum production is concerning for a CF exacerbation. Vitals and labs stable, with the exception of tachycardia (which is normal for him). CXR??pending. Recent COVID pcr is negative.??Cultures in the past have grown MDR smooth Pseudomonas (I to cipro and Zosyn, S to tobramycin). Suspect that it may now be resistant to ciprofloxacin  - IV tobramycin 240 mg q12h (11/18/18??- ), dosing by Pharmacy  - IV Zosyn 4.5 g 6h (11/18/18- )  - f/u??AFB??and CF sputum culture  - home??Symdeko  - Pulmozyme 2.5 mg BID  - montelukast 10 mg qHS  - cetirizine 10 mg daily  - Symbicort 2 puff BID??or formulary equivalent??  - HTS 7% QID w/ RT  - Aerobika and chest vest QID w/ RT  ??  History of Mycobacterium abscessus infection:??Required RULobectomy in August 2018.??Previously??on clofazimine and azithromycin. Plan to repeat AFB smears every 3 months (starting February 2020).??  - AFB smear and culture today  ??  CF-related PI  - HPHC diet  - Creon??12??capsules w/ meals, 4 w/ snacks  - MVW 2 capsules daily  ??  CF-related DM  Last A1c??8.5% (December)  - glargine 70 units qHS  - lispro??60 units TID AC  - SSI  ??  Depression, anxiety  - fluoxetine 40 mg daily  - lamotrigine 100 mg daily  ??  HTN  - lisinopril 10 mg daily  ??  History of DVT  - rivaroxaban 20 mg daily  ??  RLS  - pramipexole 0.25 mg daily  - gabapentin QHS to replace Horizant??  ??  Code??status:??full code  ??  HCDM:??Alyson Voong, wife  ??  Disposition:??inpatient stay for IV antibiotics, likely to complete course at [facility].  Please page [phone] with any questions.      Graciella Belton, MD    Subjective:      Interval History (11/22/18)  No acute events. Reports feeling well. Continued chest pain with coughing, improved with PRN oxycodone.       Objective:      Physical Exam:  Vitals:    11/21/18 2042 11/21/18 2103 11/22/18 0833 11/22/18 0843   BP:  137/78  109/63   Pulse: 108 100 93 90   Resp: 18 18 18 18    Temp:  36.4 ??C (97.5 ??F)  35.7 ??C (96.3 ??  F)   TempSrc:  Oral     SpO2: 98% 96% 97% 98%   Weight:       Height:         General: Alert, well-appearing, and in no distress.  Eyes: Anicteric sclera, conjunctiva clear.  Lungs: Good air movement bilaterally. No wheezes or crackles.  Cardiovascular: Regular rate and rhythm, S1, S2 normal, no murmur, click, rub or gallop appreciated.  Abdomen: Soft, non-tender, not distended, bowel sounds are normal  Skin: No rashes or lesions.  Neuro: No focal neurological deficits.    Malnutrition Assessment using AND/ASPEN Clinical Characteristics:                                       Patient Lines/Drains/Airways Status    Active Active Lines, Drains, & Airways     Name:   Placement date:   Placement time:   Site:   Days:    Port A Cath 05/30/17 Right Chest 05/30/17    1746    Chest   540    Power Port--a-Cath Single Hub 10/05/15 Right Chest   10/05/15    2000    Chest   1143    Peripheral IV 11/18/18 Left Forearm   11/18/18    1600    Forearm   3              Patient Lines/Drains/Airways Status    Active Wounds     Name:   Placement date:   Placement time:   Site:   Days:    Surgical Site 03/29/17 Chest Right   03/29/17    1346     602    Surgical Site Axilla Right   ???    ???                        Diagnostic Review:   All labs and images were personally reviewed.

## 2018-11-22 NOTE — Unmapped (Signed)
All VSS, denies pain, see FS for details.  Problem: Adult Inpatient Plan of Care  Goal: Plan of Care Review  Outcome: Progressing  Goal: Patient-Specific Goal (Individualization)  Outcome: Progressing  Goal: Absence of Hospital-Acquired Illness or Injury  Outcome: Progressing  Goal: Optimal Comfort and Wellbeing  Outcome: Progressing  Goal: Readiness for Transition of Care  Outcome: Progressing  Goal: Rounds/Family Conference  Outcome: Progressing     Problem: Infection  Goal: Infection Symptom Resolution  Outcome: Progressing     Problem: Wound  Goal: Optimal Wound Healing  Outcome: Progressing

## 2018-11-22 NOTE — Unmapped (Signed)
Patient did well with doing all of their treatments today. Patient did their airway clearance without complications, pt also stated he would wait until evening for 1600 airway clearance tx.

## 2018-11-22 NOTE — Unmapped (Signed)
Pt alert and oriented. VSS. Pt c/o rib/chest pain with inspiration. Pain relieved with prn oxycodone. Iv abx administered per order. Blood glucose well controlled. Contact isolation precautions maintained. Pt resting comfortably. Cal light within reach. Will continue to monitor.     Problem: Adult Inpatient Plan of Care  Goal: Absence of Hospital-Acquired Illness or Injury  Outcome: Ongoing - Unchanged  Intervention: Identify and Manage Fall Risk  Flowsheets (Taken 11/22/2018 0341)  Safety Interventions:  ??? low bed  ??? lighting adjusted for tasks/safety  ??? isolation precautions  Intervention: Prevent Skin Injury  Flowsheets (Taken 11/22/2018 0341)  Pressure Reduction Techniques: frequent weight shift encouraged  Intervention: Prevent VTE (venous thromboembolism)  Flowsheets (Taken 11/22/2018 0341)  VTE Prevention/Management:  ??? anticoagulation therapy  ??? ambulation promoted  Intervention: Prevent Infection  Flowsheets (Taken 11/22/2018 0341)  Infection Prevention:  ??? rest/sleep promoted  ??? single patient room provided  Goal: Optimal Comfort and Wellbeing  Outcome: Ongoing - Unchanged  Intervention: Monitor Pain and Promote Comfort  Flowsheets (Taken 11/22/2018 0341)  Pain Management Interventions:  ??? pain management plan reviewed with patient/caregiver  ??? quiet environment facilitated  ??? care clustered  Intervention: Provide Person-Centered Care  Flowsheets (Taken 11/22/2018 0341)  Trust Relationship/Rapport:  ??? care explained  ??? choices provided  ??? emotional support provided  ??? questions encouraged  ??? questions answered  ??? empathic listening provided  ??? thoughts/feelings acknowledged  ??? reassurance provided     Problem: Infection  Goal: Infection Symptom Resolution  Outcome: Ongoing - Unchanged  Intervention: Prevent or Manage Infection  Flowsheets (Taken 11/22/2018 0341)  Isolation Precautions: contact precautions maintained     Problem: Diabetes Comorbidity  Goal: Blood Glucose Level Within Desired Range  Outcome: Ongoing - Unchanged  Intervention: Maintain Glycemic Control  Flowsheets (Taken 11/22/2018 0341)  Glycemic Management: blood glucose monitoring

## 2018-11-23 LAB — BASIC METABOLIC PANEL
ANION GAP: 15 mmol/L (ref 7–15)
BLOOD UREA NITROGEN: 10 mg/dL (ref 7–21)
BUN / CREAT RATIO: 13
CHLORIDE: 101 mmol/L (ref 98–107)
CO2: 25 mmol/L (ref 22.0–30.0)
CREATININE: 0.8 mg/dL (ref 0.70–1.30)
EGFR CKD-EPI AA MALE: >90 mL/min/{1.73_m2} (ref >=60)
GLUCOSE RANDOM: 125 mg/dL (ref 65–179)
POTASSIUM: 4.6 mmol/L (ref 3.5–5.0)
SODIUM: 141 mmol/L (ref 135–145)

## 2018-11-23 NOTE — Unmapped (Signed)
PULMONARY PROGRESS NOTE      Patient: Christian Young(1986-03-28)  Reason for admission: Cystic fibrosis with pulmonary exacerbation (CMS-HCC)     Assessment and Recommendations:      Principal Problem:    Cystic fibrosis with pulmonary exacerbation (CMS-HCC)  Active Problems:    Essential hypertension    Depressive disorder    Mood disorder (CMS-HCC)    Anxiety    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)    Chronic pansinusitis    Pancreatic insufficiency due to cystic fibrosis (CMS-HCC)    History of Mycobacterium abscessus infection    Bronchiectasis (CMS-HCC)    Chronic deep vein thrombosis (DVT) of lower extremity (CMS-HCC)    Cystic fibrosis (CMS-HCC)    Obesity (BMI 30-39.9)    Restless leg syndrome  Resolved Problems:    * No resolved hospital problems. *    Christian Young??is a 33 year old man with cystic fibrosis [phone] and [phone] insertion) c/b bronchiectasis, pancreatic insufficiency, and diabetes, s/p RULobectomy (August 2018) for M abscessus infection, HTN, MDD, and history of DVT, who presents with??increased sputum production, likely due to a CF exacerbation.  ??  CF exacerbation w/ pulmonary manifestations w/ acute exacerbation of bronchiectasis:??Increased cough and sputum production is concerning for a CF exacerbation. Vitals and labs stable, with the exception of tachycardia (which is normal for him). CXR??pending. Recent COVID pcr is negative.??Cultures in the past have grown MDR smooth Pseudomonas (I to cipro and Zosyn, S to tobramycin). Suspect that it may now be resistant to ciprofloxacin  - CF sputum cx +2 morphologies of PSA. Will f/u sensitivities  - IV tobramycin 240 mg q12h (11/18/18??- ), dosing by Pharmacy  - IV Zosyn 4.5 g 6h (11/18/18- )  - f/u??AFB??and CF sputum culture  - home??Symdeko  - Pulmozyme 2.5 mg BID  - montelukast 10 mg qHS  - cetirizine 10 mg daily  - Symbicort 2 puff BID??or formulary equivalent??  - HTS 7% QID w/ RT  - Aerobika and chest vest QID w/ RT  ??  History of Mycobacterium abscessus infection:??Required RULobectomy in August 2018.??Previously??on clofazimine and azithromycin. Plan to repeat AFB smears every 3 months (starting February 2020).??  - AFB smear and culture today  ??  CF-related PI  - HPHC diet  - Creon??12??capsules w/ meals, 4 w/ snacks  - MVW 2 capsules daily  ??  CF-related DM  Last A1c??8.5% (December)  - glargine 70 units qHS  - lispro??60 units TID AC  - SSI  ??  Depression, anxiety  - fluoxetine 40 mg daily  - lamotrigine 100 mg daily  ??  HTN  - lisinopril 10 mg daily  ??  History of DVT  - rivaroxaban 20 mg daily  ??  RLS  - pramipexole 0.25 mg daily  - gabapentin QHS to replace Horizant??  ??  Code??status:??full code  ??  HCDM:??Alyson Petrich, wife  ??  Disposition:??inpatient stay for IV antibiotics, likely to complete course at [facility].    Please page [phone] with any questions.      Graciella Belton, MD    Subjective:      Interval History (11/23/18)  No acute events. Reports feeling well. Continued chest pain with coughing, primarily in the evening, improved with PRN oxycodone. Received 1 dose/24 hrs.       Objective:      Physical Exam:  Vitals:    11/22/18 0843 11/22/18 1953 11/22/18 2045 11/23/18 0747   BP: 109/63 131/79  116/70   Pulse: 90  108 99 88   Resp: 18 18 18 18    Temp: 35.7 ??C (96.3 ??F) 36.4 ??C (97.5 ??F)  36.1 ??C (97 ??F)   TempSrc:  Oral  Oral   SpO2: 98% 96% 98% 98%   Weight:       Height:         General: Alert, well-appearing, and in no distress.  Eyes: Anicteric sclera, conjunctiva clear.  Lungs: Good air movement bilaterally. No wheezes or crackles.  Cardiovascular: Regular rate and rhythm, S1, S2 normal, no murmur, click, rub or gallop appreciated.  Abdomen: Soft, non-tender, not distended, bowel sounds are normal  Skin: No rashes or lesions.  Neuro: No focal neurological deficits.    Malnutrition Assessment using AND/ASPEN Clinical Characteristics:                                       Patient Lines/Drains/Airways Status Active Active Lines, Drains, & Airways     Name:   Placement date:   Placement time:   Site:   Days:    Port A Cath 05/30/17 Right Chest   05/30/17    1746    Chest   541    Power Port--a-Cath Single Hub 10/05/15 Right Chest   10/05/15    2000    Chest   1144    Peripheral IV 11/18/18 Left Forearm   11/18/18    1600    Forearm   4              Patient Lines/Drains/Airways Status    Active Wounds     Name:   Placement date:   Placement time:   Site:   Days:    Surgical Site 03/29/17 Chest Right   03/29/17    1346     603    Surgical Site Axilla Right   ???    ???                        Diagnostic Review:   All labs and images were personally reviewed.

## 2018-11-23 NOTE — Unmapped (Signed)
Pt receiving antibiotics. Not in any acute distress

## 2018-11-24 LAB — CBC W/ AUTO DIFF
BASOPHILS ABSOLUTE COUNT: 0.1 10*9/L (ref 0.0–0.1)
BASOPHILS RELATIVE PERCENT: 0.8 %
BASOPHILS RELATIVE PERCENT: 0.8 % — ABNORMAL LOW (ref 7.0–10.0)
EOSINOPHILS ABSOLUTE COUNT: 0.7 10*9/L — ABNORMAL HIGH (ref 0.0–0.4)
EOSINOPHILS RELATIVE PERCENT: 7.8 %
HEMATOCRIT: 39.6 % — ABNORMAL LOW (ref 41.0–53.0)
HEMOGLOBIN: 13 g/dL — ABNORMAL LOW (ref 13.5–17.5)
LYMPHOCYTES ABSOLUTE COUNT: 1.6 10*9/L (ref 1.5–5.0)
LYMPHOCYTES RELATIVE PERCENT: 18.1 %
MEAN CORPUSCULAR HEMOGLOBIN CONC: 32.7 g/dL (ref 31.0–37.0)
MEAN CORPUSCULAR HEMOGLOBIN: 26.8 pg (ref 26.0–34.0)
MEAN CORPUSCULAR VOLUME: 81.9 fL (ref 80.0–100.0)
MONOCYTES ABSOLUTE COUNT: 0.6 10*9/L (ref 0.2–0.8)
MONOCYTES RELATIVE PERCENT: 7.2 %
NEUTROPHILS ABSOLUTE COUNT: 5.5 10*9/L (ref 2.0–7.5)
NEUTROPHILS RELATIVE PERCENT: 63 %
PLATELET COUNT: 332 10*9/L (ref 150–440)
RED BLOOD CELL COUNT: 4.83 10*12/L (ref 4.50–5.90)
RED CELL DISTRIBUTION WIDTH: 15.6 % — ABNORMAL HIGH (ref 12.0–15.0)
WBC ADJUSTED: 8.7 10*9/L (ref 4.5–11.0)

## 2018-11-24 LAB — BASIC METABOLIC PANEL
ANION GAP: 15 mmol/L (ref 7–15)
BLOOD UREA NITROGEN: 13 mg/dL (ref 7–21)
BLOOD UREA NITROGEN: 13 mg/dL (ref 7–21)
BUN / CREAT RATIO: 14
CHLORIDE: 100 mmol/L (ref 98–107)
CO2: 26 mmol/L (ref 22.0–30.0)
CREATININE: 0.94 mg/dL (ref 0.70–1.30)
EGFR CKD-EPI AA MALE: >90 mL/min/{1.73_m2} (ref >=60)
EGFR CKD-EPI NON-AA MALE: >90 mL/min/{1.73_m2} (ref >=60)
GLUCOSE RANDOM: 118 mg/dL (ref 65–179)
POTASSIUM: 4.5 mmol/L (ref 3.5–5.0)
SODIUM: 141 mmol/L (ref 135–145)

## 2018-11-24 MED ORDER — PIPERACILLIN-TAZOBACTAM 4.5 GRAM/100 ML DEXTROSE(ISO-OSM) IV PIGGYBACK
Freq: Four times a day (QID) | INTRAVENOUS | 0 refills | 0 days | Status: CP
Start: 2018-11-24 — End: 2018-12-10

## 2018-11-24 MED ORDER — TOBRAMYCIN EXTENDED INTERVAL IVPB IN 100 ML
Freq: Two times a day (BID) | INTRAVENOUS | 0 refills | 0 days | Status: CP
Start: 2018-11-24 — End: 2018-11-25

## 2018-11-24 NOTE — Unmapped (Signed)
Stable on room air. Breathing treatments given as ordered. Chest vest therapy done for airway clearence

## 2018-11-24 NOTE — Unmapped (Signed)
Pt continues on IV antibiotics and nebs. Received Oxycodone x1 overnight for c/o pain, no further complaints. No acute events overnight. Call bell within reach. Continue to monitor.   Problem: Adult Inpatient Plan of Care  Goal: Plan of Care Review  Outcome: Progressing  Goal: Patient-Specific Goal (Individualization)  Outcome: Progressing  Flowsheets (Taken 11/24/2018 0559)  Patient-Specific Goals (Include Timeframe): none expressed  Individualized Care Needs: iv antibiotics, contact isolation, nebs, monitor labs  Anxieties, Fears or Concerns: none expressed  Goal: Absence of Hospital-Acquired Illness or Injury  Outcome: Progressing  Goal: Optimal Comfort and Wellbeing  Outcome: Progressing  Goal: Readiness for Transition of Care  Outcome: Progressing  Goal: Rounds/Family Conference  Outcome: Progressing     Problem: Infection  Goal: Infection Symptom Resolution  Outcome: Progressing     Problem: Wound  Goal: Optimal Wound Healing  Outcome: Progressing     Problem: Diabetes Comorbidity  Goal: Blood Glucose Level Within Desired Range  Outcome: Progressing     Problem: Hypertension Comorbidity  Goal: Blood Pressure in Desired Range  Outcome: Progressing

## 2018-11-24 NOTE — Unmapped (Addendum)
Disciplines requested: Nursing and Home IV Antibiotics    Physician to follow patient's care (the person listed here will be responsible for signing ongoing orders): Driscilla Grammes    Inpatient Start Date of Antibiotics: 11/18/18  Start of Care Date: 11/18/18  Estimate completion of therapy: through 12/01/18  Home health/infusion please reach out to phone contact (below) 2-3 days prior to end of therapy    **FAX RESULTS TO: **  CF Patient: [id number]    *PHONE CONTACT*:  CF Patient: [phone]    ---------------------------------------------------------------------------------------------------------------------------------------------------------------------------  IV ANTIBIOTIC ORDERS:  ? Piperacillin-tazobactam (Zosyn) 4.5g IV every 6 hours   ? Tobramycin 220mg  IV ever 12 hours (recent dose change on 11/24/18)    **DO NOT DRAW DRUG LEVELS FROM PORT**    LAB MONITORING ORDERS:  Start labs on 04/16  (Tobramycin Conventional Dose and Ceftazidime/Cefepime/Aztreonam/Ceftaroline/Piperacillin-tazobactam): ONCE weekly: CBC with diff, tobramycin trough level on Mondays (1 hour prior to START of infusion time); TWICE weekly: BMP, Mg. Please collect once weekly labs on Mondays and twice weekly labs on Mondays and Thursdays.    Collect on MONDAYS of each week:  CBC + diff  Magnesium  Basic Metabolic Panel  Tobramycin Trough Level (collect prior to START of infusion)    Collect on THURSDAYS of each week (only for aminoglycosides)  Magnesium  Basic Metabolic Panel   For 04/16 please collect tobramycin trough level prior to the start of the infusion. Lab collection ordered due to recent dose change.     TARGET DRUG LEVELS:  -Tobramycin (Conventional Dosing): Target end of infusion peak: 10-14 mcg/mL, trough < 1 mcg/mL

## 2018-11-24 NOTE — Unmapped (Signed)
PULMONARY PROGRESS NOTE      Patient: Christian Young([birth year])  Reason for admission: Cystic fibrosis with pulmonary exacerbation (CMS-HCC)     Assessment and Recommendations:      Principal Problem:    Cystic fibrosis with pulmonary exacerbation (CMS-HCC)  Active Problems:    Essential hypertension    Depressive disorder    Mood disorder (CMS-HCC)    Anxiety    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)    Chronic pansinusitis    Pancreatic insufficiency due to cystic fibrosis (CMS-HCC)    History of Mycobacterium abscessus infection    Bronchiectasis (CMS-HCC)    Chronic deep vein thrombosis (DVT) of lower extremity (CMS-HCC)    Cystic fibrosis (CMS-HCC)    Obesity (BMI 30-39.9)    Restless leg syndrome  Resolved Problems:    * No resolved hospital problems. *    Christian Young a 33 year old man with cystic fibrosis [phone] and [phone] insertion) c/b bronchiectasis, pancreatic insufficiency, and diabetes, s/p RULobectomy (August 2018) for M abscessus infection, HTN, MDD, and history of DVT, who presents with??increased sputum production, likely due to a CF exacerbation.  ??  CF exacerbation w/ pulmonary manifestations w/ acute exacerbation of bronchiectasis:??Presented with increased cough and sputum production. CXR without new infiltrates. Recent COVID pcr is negative.??  - He will complete a 2 week course of IV antibiotics with pip/tazo and tobramycin, with the goal of completing them at home. Will talk to V-IR today about accessing port since nurses were unable to do so. Will also talk to case management about home antibiotics. If this can all be arranged, goal would be to dc tomorrow  - CF sputum cx from 4/9 with both smooth and mucoid PsA. Sensitivities below  - IV tobramycin 240 mg q12h (11/18/18??- ), dosing by Pharmacy  - IV Zosyn 4.5 g 6h (11/18/18- )  - home??Symdeko  - Pulmozyme 2.5 mg BID  - montelukast 10 mg qHS  - cetirizine 10 mg daily  - Symbicort 2 puff BID??or formulary equivalent??  - HTS 7% QID w/ RT  - Aerobika and chest vest QID w/ RT    Eosinophilia: noted 4/13. No rash or other evidence for drug reaction. CTM  ??  History of Mycobacterium abscessus infection:??Required RULobectomy in August 2018.??Previously??on clofazimine and azithromycin. Plan to repeat AFB smears every 3 months (starting February 2020).??  - AFB smear and culture 4/9  ??  CF-related PI  - HPHC diet  - Creon??12??capsules w/ meals, 4 w/ snacks  - MVW 2 capsules daily  ??  CF-related DM  Last A1c??8.5% (December)  - glargine 70 units qHS  - lispro??60 units TID AC  - SSI  ??  Depression, anxiety  - fluoxetine 40 mg daily  - lamotrigine 100 mg daily  ??  HTN  - lisinopril 10 mg daily  ??  History of DVT  - rivaroxaban 20 mg daily  ??  RLS  - pramipexole 0.25 mg daily  - gabapentin QHS to replace Horizant??  ??  Code??status:??full code  ??  HCDM:??Alyson Jares, wife  ??  Disposition:??inpatient stay for IV antibiotics, likely to complete course at [facility]    Please page [phone] with any questions.      Guido Sander, MD    Subjective:      Interval History (11/24/18)  No acute events. Reports feeling well. Continued chest pain with coughing, primarily in the evening, improved with PRN oxycodone. Inquiring about discharging home on PO regimen.  Objective:      Physical Exam:  Vitals:    11/23/18 0747 11/23/18 2002 11/23/18 2045 11/24/18 0808   BP: 116/70 142/84  135/77   Pulse: 88 100 104 90   Resp: 18 18 18 18    Temp: 36.1 ??C 37.2 ??C  36.3 ??C   TempSrc: Oral Oral  Oral   SpO2: 98% 96% 98% 97%   Weight:       Height:         General: Alert, well-appearing, and in no distress.  Eyes: Anicteric sclera, conjunctiva clear.  Lungs: Good air movement bilaterally. No wheezes or crackles.  Cardiovascular: Regular rate and rhythm, S1, S2 normal, no murmur, click, rub or gallop appreciated.  Abdomen: Soft, non-tender, not distended, bowel sounds are normal  Skin: No rashes or lesions.  Neuro: No focal neurological deficits. Malnutrition Assessment using AND/ASPEN Clinical Characteristics:                                       Patient Lines/Drains/Airways Status    Active Active Lines, Drains, & Airways     Name:   Placement date:   Placement time:   Site:   Days:    Port A Cath 05/30/17 Right Chest   05/30/17    1746    Chest   542    Power Port--a-Cath Single Hub 10/05/15 Right Chest   10/05/15    2000    Chest   1145    Peripheral IV 11/18/18 Left Forearm   11/18/18    1600    Forearm   5              Patient Lines/Drains/Airways Status    Active Wounds     Name:   Placement date:   Placement time:   Site:   Days:    Surgical Site 03/29/17 Chest Right   03/29/17    1346     604    Surgical Site Axilla Right   ???    ???                        Diagnostic Review:   All labs and images were personally reviewed.    4/9 sputum culture:

## 2018-11-24 NOTE — Unmapped (Signed)
Aminoglycoside Therapeutic Monitoring Pharmacy Note    Christian Young is a 33 y.o. male continuing tobramycin. Date of therapy initiation: 11/18/18     Indication: CF exacerbation    Prior Dosing Information: Current regimen 240 mg IV q 12 hours       Goals:  Therapeutic Drug Levels  ?? Trough level: tobramycin <1 mg/L  ?? Peak level: tobramycin 10-14 mg/L    Additional Clinical Monitoring/Outcomes  Renal function, volume status (intake and output)    Results:   ?? Peak level: 8.7 mg/L (extrapolated peak 12.5 mg/L)    ?? Random level: 1 mg/L (extrapolated trough: 0.2 mg/L)     Wt Readings from Last 1 Encounters:   11/21/18 (!) 118.2 kg (260 lb 9.6 oz)     Lab Results   Component Value Date    CREATININE 0.94 11/24/2018       Pharmacokinetic Considerations and Significant Drug Interactions:  ? Adult (calculated on 11/24/18): Vd = 17.7 L, ke = 0.36 hr-1  ? Concurrent nephrotoxic meds: Zosyn    Assessment/Plan:  Recommendation(s)  ? Change current regimen to 220 mg IV q 12 hours. Concern for accumulation given renal Scr bump as well as concurrent use of Zosyn.   ? Estimated peak and trough on recommended regimen: peak = 11.5 mg/L, trough = 0.2 mg/L    Follow-up  ? Level due: peak and trough levels around the fourth or fifth dose  ? A pharmacist will continue to monitor and order levels as appropriate    Please page service pharmacist with questions/clarifications.    Nino Parsley, PharmD

## 2018-11-24 NOTE — Unmapped (Signed)
Pleasant and cooperative with care. Independent in the room. No complaints of pain. IV antibiotics given and tolerating well. With excellent appetite. Continue with POC.  Problem: Adult Inpatient Plan of Care  Goal: Plan of Care Review  Outcome: Progressing  Goal: Patient-Specific Goal (Individualization)  Outcome: Progressing  Goal: Absence of Hospital-Acquired Illness or Injury  Outcome: Progressing  Goal: Optimal Comfort and Wellbeing  Outcome: Progressing  Goal: Readiness for Transition of Care  Outcome: Progressing  Goal: Rounds/Family Conference  Outcome: Progressing     Problem: Infection  Goal: Infection Symptom Resolution  Outcome: Progressing     Problem: Wound  Goal: Optimal Wound Healing  Outcome: Progressing     Problem: Diabetes Comorbidity  Goal: Blood Glucose Level Within Desired Range  Outcome: Progressing     Problem: Hypertension Comorbidity  Goal: Blood Pressure in Desired Range  Outcome: Progressing

## 2018-11-25 LAB — CBC W/ AUTO DIFF
BASOPHILS ABSOLUTE COUNT: 0.1 10*9/L (ref 0.0–0.1)
BASOPHILS ABSOLUTE COUNT: 0.1 10*9/L — ABNORMAL LOW (ref 0.0–0.1)
BASOPHILS RELATIVE PERCENT: 0.8 %
EOSINOPHILS ABSOLUTE COUNT: 0.7 10*9/L — ABNORMAL HIGH (ref 0.0–0.4)
EOSINOPHILS RELATIVE PERCENT: 9.2 %
HEMATOCRIT: 39.6 % — ABNORMAL LOW (ref 41.0–53.0)
HEMOGLOBIN: 13 g/dL — ABNORMAL LOW (ref 13.5–17.5)
LARGE UNSTAINED CELLS: 2 % (ref 0–4)
LYMPHOCYTES ABSOLUTE COUNT: 1.5 10*9/L (ref 1.5–5.0)
LYMPHOCYTES RELATIVE PERCENT: 20.3 %
MEAN CORPUSCULAR HEMOGLOBIN: 26.8 pg (ref 26.0–34.0)
MEAN CORPUSCULAR VOLUME: 81.7 fL (ref 80.0–100.0)
MEAN PLATELET VOLUME: 7.2 fL (ref 7.0–10.0)
MONOCYTES ABSOLUTE COUNT: 0.6 10*9/L (ref 0.2–0.8)
MONOCYTES RELATIVE PERCENT: 8.2 %
NEUTROPHILS ABSOLUTE COUNT: 4.4 10*9/L (ref 2.0–7.5)
PLATELET COUNT: 377 10*9/L (ref 150–440)
RED BLOOD CELL COUNT: 4.85 10*12/L (ref 4.50–5.90)
RED CELL DISTRIBUTION WIDTH: 15.6 % — ABNORMAL HIGH (ref 12.0–15.0)
WBC ADJUSTED: 7.5 10*9/L (ref 4.5–11.0)

## 2018-11-25 LAB — BASIC METABOLIC PANEL
ANION GAP: 12 mmol/L (ref 7–15)
BLOOD UREA NITROGEN: 12 mg/dL (ref 7–21)
BUN / CREAT RATIO: 13
CALCIUM: 9.4 mg/dL (ref 8.5–10.2)
CO2: 28 mmol/L (ref 22.0–30.0)
CREATININE: 0.96 mg/dL (ref 0.70–1.30)
EGFR CKD-EPI AA MALE: >90 mL/min/{1.73_m2} (ref >=60)
EGFR CKD-EPI NON-AA MALE: >90 mL/min/{1.73_m2} (ref >=60)
GLUCOSE RANDOM: 101 mg/dL (ref 65–179)
POTASSIUM: 4.7 mmol/L (ref 3.5–5.0)
SODIUM: 141 mmol/L (ref 135–145)

## 2018-11-25 MED ORDER — LISINOPRIL 10 MG TABLET
ORAL_TABLET | Freq: Every day | ORAL | 0 refills | 0.00000 days | Status: SS
Start: 2018-11-25 — End: 2019-02-01

## 2018-11-25 MED ORDER — ALTEPLASE 2 MG INTRA-CATHETER SOLUTION
Freq: Once | INTRAVENOUS | 0 refills | 0.00000 days | Status: CP | PRN
Start: 2018-11-25 — End: 2018-12-10

## 2018-11-25 MED ORDER — BLOOD-GLUCOSE METER KIT WRAPPER
0 refills | 0 days | Status: CP
Start: 2018-11-25 — End: 2019-11-25

## 2018-11-25 MED ORDER — TOBRAMYCIN EXTENDED INTERVAL IVPB IN 100 ML: 220 mg | each | Freq: Two times a day (BID) | 0 refills | 0 days | Status: AC

## 2018-11-25 MED ORDER — TOBRAMYCIN EXTENDED INTERVAL IVPB IN 100 ML
Freq: Two times a day (BID) | INTRAVENOUS | 0 refills | 0.00000 days | Status: CP
Start: 2018-11-25 — End: 2018-11-25

## 2018-11-25 MED ORDER — ALBUTEROL SULFATE CONCENTRATE 2.5 MG/0.5 ML SOLUTION FOR NEBULIZATION
Freq: Four times a day (QID) | RESPIRATORY_TRACT | 3 refills | 0.00000 days | Status: CP | PRN
Start: 2018-11-25 — End: 2019-11-25

## 2018-11-25 MED ORDER — TRAZODONE 100 MG TABLET
ORAL_TABLET | Freq: Every evening | ORAL | 0 refills | 0 days | Status: SS
Start: 2018-11-25 — End: 2019-02-01

## 2018-11-25 MED ORDER — ALTEPLASE 2 MG INTRA-CATHETER SOLUTION: 2 mg | each | Freq: Once | 0 refills | 0 days | Status: AC

## 2018-11-25 NOTE — Unmapped (Signed)
Patient self administered chest vest

## 2018-11-25 NOTE — Unmapped (Signed)
Physician Discharge Summary    Admit date: 11/18/2018    Discharge date and time: 11/25/2018    Discharge to: home    Discharge Service: Pulmonary Service at [facility]    Discharge Attending Physician: Esmond Plants, DO    Discharge Diagnoses: Cystic fibrosis exacerbation    Procedures: none    Pertinent Test Results: microbiology: sputum culture: positive    Hospital Course:   Patient was hospitalized for CF exacerbation after failing outpatient oral therapy. He had no significant events during his hospitalization. His port was initially not used for IV antibiotics due to inability to access it. V-IR was consulted. They accessed it and instilled tPA. Post t-PA port still was difficult to flush and unable to draw blood from, however, IV antibiotics were still able to infuse safely. Patient stated this has been the condition of his port since it was placed and that he and wife do not have trouble administering antibiotics or flushing it at home.      He was noted to have peripheral eosinophilia, but had no concerning signs or symptoms. Possibly due to IV antibiotics. Another CBC with diff and BMP will be drawn on 4/17 to follow up on this.     Antibiotic duration: started on 11/18/18 with anticipated end date of 12/01/18    Condition at Discharge: good  Discharge Medications:      Your Medication List      STOP taking these medications    ALPRAZolam 0.5 MG tablet  Commonly known as:  XANAX     levoFLOXacin 750 MG tablet  Commonly known as:  LEVAQUIN     polyethylene glycol 236-22.74-6.74 gram solution  Commonly known as:  GoLYTELY        START taking these medications    piperacillin-tazobactam 4.5 gram/100 mL Pgbk  Commonly known as:  ZOSYN  Infuse 100 mL (4.5 g total) into a venous catheter Every six (6) hours for 7 days.     tobramycin 220 mg, OVERFILL 10 mL in sodium chloride 0.9 % 100 mL IVPB  Infuse 220 mg into a venous catheter every twelve (12) hours.        CHANGE how you take these medications insulin ASPART 100 unit/mL (3 mL) injection pen  Commonly known as:  NovoLOG FLEXPEN  Use up to 150 units/day, divided TID AC meals  What changed:  additional instructions     sodium chloride 7% 7 % Nebu  Inhale 4 mL by nebulization Two (2) times a day. Increase to 4 times while taking antibiotics  What changed:  additional instructions        CONTINUE taking these medications    albuterol 90 mcg/actuation inhaler  Commonly known as:  PROVENTIL HFA;VENTOLIN HFA  Inhale 2 puffs every six (6) hours as needed.     albuterol 2.5 mg/0.5 mL nebulizer solution  Inhale 0.5 mL (2.5 mg total) by nebulization every six (6) hours as needed for wheezing.     blood sugar diagnostic Strp  Dispense 150 blood glucose test strips, ok to sub any brand preferred by insurance/patient, use up to 5x/day, dx E 08.9     blood-glucose meter kit  Dispense meter that is preferred by patient's insurance company     blood-glucose meter,continuous Misc  Commonly known as:  DEXCOM G6 RECEIVER  Use as directed     blood-glucose sensor Devi  Commonly known as:  DEXCOM G6 SENSOR  Use sq as directed every 14 days     blood-glucose  transmitter Devi  Commonly known as:  DEXCOM G6 TRANSMITTER  Use as directed     budesonide-formoteroL 160-4.5 mcg/actuation inhaler  Commonly known as:  SYMBICORT  Inhale 2 puffs Two (2) times a day.     cetirizine 10 MG tablet  Commonly known as:  ZyrTEC  TAKE 1 TABLET (10 MG TOTAL) BY MOUTH DAILY.     diclofenac sodium 1 % gel  Commonly known as:  VOLTAREN  Apply 2 g topically Four (4) times a day.     dornase alfa 1 mg/mL nebulizer solution  Commonly known as:  PULMOZYME  Inhale 2.5 mg Two (2) times a day.     FLUoxetine 20 MG capsule  Commonly known as:  PROzac  Take 40 mg by mouth daily.     glucagon (human recombinant) 1 mg/ml injection  Commonly known as:  glucagon  Inject 1 mL (1 mg total) under the skin once as needed (severe hypoglycemia). Follow package directions for low blood sugar.     HORIZANT 600 mg Tber Generic drug:  gabapentin enacarbil  Take 1,200 mg by mouth nightly.     insulin glargine 100 unit/mL (3 mL) injection pen  Commonly known as:  BASAGLAR, LANTUS  Inject 0.7 mL (70 Units total) under the skin daily.     lamoTRIgine 200 MG tablet  Commonly known as:  LaMICtal  Take 200 mg by mouth daily.     LC PLUS Misc  Generic drug:  nebulizers  Use as directed with inhaled medications     lidocaine-prilocaine 2.5-2.5 % cream  Commonly known as:  EMLA  Apply topically every twelve (12) hours.     lipase-protease-amylase 36,000-114,000- 180,000 unit Cpdr  Commonly known as:  CREON  TAKE 7-8 CAPSULES BY MOUTH WITH MEALS AND 5-6 CAPSULES WITH SNACKS     lisinopriL 10 MG tablet  Commonly known as:  PRINIVIL,ZESTRIL  Take 1 tablet (10 mg total) by mouth daily.     melatonin 10 mg Tab  Take 10 mg by mouth nightly.     montelukast 10 mg tablet  Commonly known as:  SINGULAIR  Take 1 tablet (10 mg total) by mouth nightly.     MVW COMPLETE FORMUL PROBIOTIC 40 billion cell -15 mg Cpdr  Generic drug:  Lacto-Bif-Sac-Bacil-Strep-bact  Take 1 capsule by mouth daily.     MVW COMPLETE FORMULATION D5000 5,000-800 unit-mcg Cap  Generic drug:  pediatric multivit 61-D3-vit K  Take 2 capsules by mouth daily.     omeprazole 20 MG capsule  Commonly known as:  PriLOSEC  Take 1 capsule (20 mg total) by mouth daily.     pen needle, diabetic 32 gauge x 5/32 Ndle  Commonly known as:  BD ULTRA-FINE NANO PEN NEEDLE  ok to sub any brand or size needle preferred by insurance/patient, use up to 4x/day, dx E 08.9     pramipexole 0.125 MG tablet  Commonly known as:  MIRAPEX  Take 0.25 mg by mouth daily.     SYMDEKO 100-150 mg (d)/ 150 mg (n) tablet  Generic drug:  tezacaftor-ivacaftor  TAKE BY MOUTH AS DIRECTED ON PACKAGE LABELING     traZODone 100 MG tablet  Commonly known as:  DESYREL  Take 2 tablets (200 mg total) by mouth nightly.     XARELTO 20 mg tablet  Generic drug:  rivaroxaban  Take 20 mg by mouth daily.            Pending Test Results: Pending Labs  Order Current Status    AFB culture Preliminary result          Discharge Instructions:       Follow Up instructions and Outpatient Referrals     Referral to Home Infusion      Performing location?:  External    Home Health Requested Disciplines:  Nursing    Do you want ongoing co-management?:  Yes    Care coordination required?:  No     **Please contact your service pharmacist for assistance with discharge home health infusion monitoring. Please see link above for Laboratory Monitoring Guidelines.          Appointments which have been scheduled for you    Dec 25, 2018  1:45 PM EDT  (Arrive by 1:30 PM)  RETURN PFT 15 with [facility] PFT 2  [facility] PULMONARY SPECIALTY FUNCT [facility]) [address]  [address]  [place] [address]  [phone]      Dec 25, 2018  2:00 PM EDT  (Arrive by 1:45 PM)  RETURN CYSTIC FIBROSIS with Darnelle Bos, MD  [facility] PULMONARY SPECIALTY CL [facility] [place] [facility] REGION) [address]  [address]  [place] [address]  [phone]      Jan 27, 2019  2:00 PM EDT  (Arrive by 1:45 PM)  Office Visit with Harrie Foreman, MD  [facility] DIABETES AND ENDOCRINOLOGY [facility] REGION) [address]  [address]  [place] [address]  [phone]              I spent greater than 30 minutes in the discharge of this patient.

## 2018-11-25 NOTE — Unmapped (Signed)
Pt alert and oriented. VSS. IV abx administered per order. Labs obtained. VIR accessed port today, but flush is sluggish and no blood return noted even after tpa. Pt aware he may have to have port stripped. Oxy given x1 for chest/rib pain. Glucose controlled. Isolation precautions maintained. Bed lowered and call light in reach. Will continue to monitor.     Problem: Adult Inpatient Plan of Care  Goal: Absence of Hospital-Acquired Illness or Injury  Outcome: Ongoing - Unchanged  Intervention: Identify and Manage Fall Risk  Flowsheets (Taken 11/24/2018 1852)  Safety Interventions:  ??? low bed  ??? isolation precautions  ??? fall reduction program maintained  Intervention: Prevent Skin Injury  Flowsheets (Taken 11/24/2018 1852)  Pressure Reduction Techniques: frequent weight shift encouraged  Intervention: Prevent VTE (venous thromboembolism)  Flowsheets (Taken 11/24/2018 1852)  VTE Prevention/Management:  ??? anticoagulation therapy  ??? ambulation promoted  Goal: Optimal Comfort and Wellbeing  Outcome: Ongoing - Unchanged  Intervention: Monitor Pain and Promote Comfort  Flowsheets (Taken 11/24/2018 1852)  Pain Management Interventions:  ??? care clustered  ??? pain management plan reviewed with patient/caregiver  ??? quiet environment facilitated  Intervention: Provide Person-Centered Care  Flowsheets (Taken 11/24/2018 1852)  Trust Relationship/Rapport:  ??? care explained  ??? choices provided  ??? questions answered  ??? empathic listening provided  ??? reassurance provided  ??? thoughts/feelings acknowledged  ??? questions encouraged  ??? emotional support provided     Problem: Diabetes Comorbidity  Goal: Blood Glucose Level Within Desired Range  Outcome: Ongoing - Unchanged  Intervention: Maintain Glycemic Control  Flowsheets (Taken 11/24/2018 1852)  Glycemic Management:  ??? blood glucose monitoring  ??? oral hydration promoted     Problem: Hypertension Comorbidity  Goal: Blood Pressure in Desired Range  Outcome: Ongoing - Unchanged

## 2018-11-25 NOTE — Unmapped (Signed)
PULMONARY PROGRESS NOTE      Patient: Christian Young(12/03/1985)  Reason for admission: Cystic fibrosis with pulmonary exacerbation (CMS-HCC)     Assessment and Recommendations:      Principal Problem:    Cystic fibrosis with pulmonary exacerbation (CMS-HCC)  Active Problems:    Essential hypertension    Depressive disorder    Mood disorder (CMS-HCC)    Anxiety    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)    Chronic pansinusitis    Pancreatic insufficiency due to cystic fibrosis (CMS-HCC)    History of Mycobacterium abscessus infection    Bronchiectasis (CMS-HCC)    Chronic deep vein thrombosis (DVT) of lower extremity (CMS-HCC)    Cystic fibrosis (CMS-HCC)    Obesity (BMI 30-39.9)    Restless leg syndrome  Resolved Problems:    * No resolved hospital problems. *    Christian Young??is a 33 year old man with cystic fibrosis [phone] and [phone] insertion) c/b bronchiectasis, pancreatic insufficiency, and diabetes, s/p RULobectomy (August 2018) for M abscessus infection, HTN, MDD, and history of DVT, who presents with??increased sputum production, likely due to a CF exacerbation.  ??  CF exacerbation w/ pulmonary manifestations w/ acute exacerbation of bronchiectasis:??Presented with increased cough and sputum production. CXR without new infiltrates. Recent COVID pcr is negative.??  - He will complete a 2 week course of IV antibiotics with pip/tazo and tobramycin to complete at home. Goal end date is 12/01/18.   - Status post tPA through port after V-IR accessed. Port still difficult to flush but no issue with antibiotics infusion. Pip/tazo was successfully administered through port.   - CF sputum cx from 4/9 with both smooth and mucoid PsA. Sensitivities below  - IV tobramycin 220 mg q12h (11/18/18??- 12/01/18), dosing by Pharmacy  - IV Zosyn 4.5 g 6h (11/18/18-12/01/18 )  - home??Symdeko  - Pulmozyme 2.5 mg BID  - montelukast 10 mg qHS  - cetirizine 10 mg daily  - Symbicort 2 puff BID??or formulary equivalent??  - HTS 7% QID w/ RT  - Aerobika and chest vest QID w/ RT    Eosinophilia: noted 4/13. No rash or other evidence for drug reaction. CTM  - Repeat labs on 11/28/18  ??  History of Mycobacterium abscessus infection:??Required RULobectomy in August 2018.??Previously??on clofazimine and azithromycin. Plan to repeat AFB smears every 3 months (starting February 2020).??  - AFB smear and culture 4/9  ??  CF-related PI  - HPHC diet  - Creon??12??capsules w/ meals, 4 w/ snacks  - MVW 2 capsules daily  ??  CF-related DM  Last A1c??8.5% (December)  - glargine 70 units qHS  - lispro??60 units TID AC  - SSI  ??  Depression, anxiety  - fluoxetine 40 mg daily  - lamotrigine 100 mg daily  ??  HTN  - lisinopril 10 mg daily  ??  History of DVT  - rivaroxaban 20 mg daily  ??  RLS  - pramipexole 0.25 mg daily  - gabapentin QHS to replace Horizant??  ??  Code??status:??full code  ??  HCDM:??Alyson Boodram, wife  ??  Disposition:??inpatient stay for IV antibiotics, likely to complete course at [facility].    Please page [phone] with any questions.      Esmond Plants, DO    Subjective:      Interval History (11/25/18)  No acute events. Reports feeling well. Continued chest pain with coughing, primarily in the evening, improved with PRN oxycodone. Inquiring about discharging home on PO regimen.  Objective:      Physical Exam:  Vitals:    11/24/18 0808 11/24/18 1908 11/24/18 2046 11/25/18 0751   BP: 135/77 131/74  142/83   Pulse: 90 93 108 95   Resp: 18 16 16 16    Temp: 36.3 ??C (97.3 ??F) 36.3 ??C (97.3 ??F)  36.6 ??C (97.9 ??F)   TempSrc: Oral Oral  Oral   SpO2: 97% 97% 97% 98%   Weight:       Height:         General: Alert, well-appearing, and in no distress.  Eyes: Anicteric sclera, conjunctiva clear.  Lungs: Good air movement bilaterally. No wheezes or crackles.  Cardiovascular: Regular rate and rhythm, S1, S2 normal, no murmur, click, rub or gallop appreciated.  Abdomen: Soft, non-tender, not distended, bowel sounds are normal  Skin: No rashes or lesions.  Neuro: No focal neurological deficits.    Malnutrition Assessment using AND/ASPEN Clinical Characteristics:                                       Patient Lines/Drains/Airways Status    Active Active Lines, Drains, & Airways     Name:   Placement date:   Placement time:   Site:   Days:    Port A Cath 05/30/17 Right Chest   05/30/17    1746    Chest   543    Power Port--a-Cath Single Hub 10/05/15 Right Chest   10/05/15    2000    Chest   1146    Peripheral IV 11/18/18 Left Forearm   11/18/18    1600    Forearm   6              Patient Lines/Drains/Airways Status    Active Wounds     Name:   Placement date:   Placement time:   Site:   Days:    Surgical Site 03/29/17 Chest Right   03/29/17    1346     605    Surgical Site Axilla Right   ???    ???                        Diagnostic Review:   All labs and images were personally reviewed.    4/9 sputum culture:

## 2018-11-25 NOTE — Unmapped (Signed)
POC reviewed with patient at start of shift. VSS and afebrile.  Pt reports pain in right side rib cage that is controlled with oxycodone.  Abx administered per order from PIV.  RCW port accessed but very difficult to forward flush and no blood return. Dressing CDI.  Pt currently sleeping.  Will continue to monitor.  Problem: Adult Inpatient Plan of Care  Goal: Plan of Care Review  Outcome: Progressing  Goal: Patient-Specific Goal (Individualization)  Outcome: Progressing  Goal: Absence of Hospital-Acquired Illness or Injury  Outcome: Progressing  Goal: Optimal Comfort and Wellbeing  Outcome: Progressing  Goal: Readiness for Transition of Care  Outcome: Progressing  Goal: Rounds/Family Conference  Outcome: Progressing     Problem: Infection  Goal: Infection Symptom Resolution  Outcome: Progressing     Problem: Wound  Goal: Optimal Wound Healing  Outcome: Progressing     Problem: Diabetes Comorbidity  Goal: Blood Glucose Level Within Desired Range  Outcome: Progressing     Problem: Hypertension Comorbidity  Goal: Blood Pressure in Desired Range  Outcome: Progressing

## 2018-11-25 NOTE — Unmapped (Signed)
Stable on room air. Breathing treaments done as ordered. Home chest vest done for airway clearance

## 2018-11-27 NOTE — Unmapped (Signed)
Outpatient Lab Results from Thursday, November 27, 2018     Outpatient IV Antimicrobial Therapy:  Start Date: 11/18/18   End Date: 12/02/18 (D14)  Line: PORT  Referring pulmonologist: Maia Plan    IV Antibiotics Ordered:  Tobramycin 220mg  IV every 12 hours (Conventional dosing, infuse over 30 minutes)  Piperacillin-tazobactam (Zosyn) 4.5g IV every 6 hours       GENERAL LABS:  See Care Everywhere, [facility]   Home  4/16 [facility]  4/14   BUN  12 12   SCR  0.97 0.96   MG 2.0 1.9     CBC    Home  4/16 Hosp  4/14   WBC 7.5 7.5   HGB  13.8  13.0   HCT 42.0  39.6   PLT  387  377     LFTs   Hosp  4/7   AST  63   ALT  77     Date andTime: Measured Drug Levels   4/16 @ 1750: Tobramycin Trough: <0.6  mcg/mL      Patient was called to discuss start and stop time of Tobramycin on date of drug level collection.    Patient Reported Administration Time:   Start of infusion: 0700   End of infusion: 0730        PLAN     1. Results note forwarded to: Maia Plan  2. Drug levels forwarded to CPP Michel Santee, RN

## 2018-11-28 NOTE — Unmapped (Signed)
Adult Cystic Fibrosis Clinic Pharmacist Note: Outpatient Home IV Antibiotic Lab Monitoring     Reviewed Raymir Frommelt labs from yesterday.     IV Antibiotics Ordered:  ?? Tobramycin 220mg  IV every 12 hours (Conventional dosing, infuse over 30 minutes)  ?? Goal peak: 10-14 mcg/mL and trough < 1 mcg/mL    ?? Piperacillin-tazobactam (Zosyn) 4.5g IV every 6 hours  ??  Recommendations   ?? Renal function stable and trough within goal.  ?? Continue current doses of medications above.    Next labs: Due 12/01/18  ?? BMP, Magnesium level, CBC with diff  ?? Due to complete IVs on 12/02/18, if extended, then order peak on Thursday 12/04/18.      [name], PharmD, BCPS, CPP  Clinical Pharmacist Practitioner  [facility]  [facility]  Pager: [phone]      CC:   Cyndie Mull, RN

## 2018-12-01 ENCOUNTER — Telehealth: Admit: 2018-12-01 | Discharge: 2018-12-02 | Payer: PRIVATE HEALTH INSURANCE

## 2018-12-01 DIAGNOSIS — F419 Anxiety disorder, unspecified: Secondary | ICD-10-CM

## 2018-12-01 DIAGNOSIS — F329 Major depressive disorder, single episode, unspecified: Secondary | ICD-10-CM

## 2018-12-01 DIAGNOSIS — E089 Diabetes mellitus due to underlying condition without complications: Secondary | ICD-10-CM

## 2018-12-01 NOTE — Unmapped (Signed)
[facility] Pulmonary Diseases and Critical Care Medicine  Video Telehealth Visit    Adult Cystic Fibrosis Clinic    12/01/18     Assessment:      Patient:Christian Young ([birth year])    Christian Young is a 33 y.o. male who is seen in a scheduled video telehealth visit for cystic fibrosis. He was recently admitted to [facility] for a CF pulmonary exacerbation. His COVID-19 test was negative. He was treated with IV Tobramycin and IV Zosyn and discharged to complete the second week at home. He feels significantly improved, and we'll plan to finish his 2 week course tomorrow. We'll prescribe Tobi Podhaler every other month as he chronically grows Pseudomonas. He is due for vitamin levels and an A1c, so we'll check those at his PCP appointment this week. He continues on Prozac and has follow up scheduled with his Psychiatrist in May.      Plan:      Problem List Items Addressed This Visit        Respiratory    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)    Cystic fibrosis (CMS-HCC) - Primary       Endocrine    Pancreatic insufficiency due to cystic fibrosis (CMS-HCC)      Other Visit Diagnoses     Anxiety and depression            1) Plan to finish 2 week course of IV Tobramycin and IV Zosyn tomorrow. Will deaccess Port.   2) Continue home inhaled CF meds and airway clearance. Patient really like the [employer].   3) Start Tobi Podhaler every other month for chronic growth of Pseudomonas.   4) Continue Symdeko as prescribed.   5) Will order a home spirometer to monitor PFT's.  6) Due for Vitamin A, D, and E levels, and total IgE level for CF monitoring - will have done through PCP office.   7) Encouraged increased exercise.   8) Increase sinus saline rinses to daily.   9) Continue follow up with [facility] for diabetes.   10) Continue follow up with local Psychiatrist. Encouraged to talk to them about a therapist.     Return in about 3 months (around 03/02/2019).    The above plan was discussed with the patient and he is in agreement.      Durenda Hurt Rulo, PA-C  [facility]   [phone]     Subjective:      Patient's primary  pulmonologist: Maia Plan MD  Patient's physical location during visit [place], State): [place], [place]   Patient's call back number: [phone]  Name/Relationship of other individuals with the patient also on the call during video telehealth visit: [name] (wife), mother and father  I have confirmed that the patient requests and consents to having the above listed persons stay during the video telehealth visit.    HPI: Christian Young is a 33 y.o. male who seen in a scheduled video telehealth visit visit for cystic fibrosis. He was admitted to [facility] from 4/7 - 11/25/18 for a CF pulmonary exacerbation. He tested negative for COVID-19. He was treated with IV Tobramycin and Zosyn for 1 week and then [facility]. He reports that his initial symptoms were increased cough, sputum, shortness of breath, runny nose and sore throat. He feels much improved with decreased cough and sputum. He also reports decreased sinus congestion/pressure, headaches, and earaches. He is doing his treatments regularly at home. He does  sinus saline rinses as needed. He denies GI symptoms such as abdominal pain, nausea, vomiting, diarrhea or constipation. His appetite has been okay on IV antibiotics, but that is normal. He has about 2-3 formed BM's per day. He reports that the MVW probiotic has helped his bowel movements on antibiotics. He reports that his diabetes is good. His blood sugars have been ranging from 130-180. He takes Lantus and Novolog. He has a Dexcom and doesn't know how he managed before. He has anxiety and depression. It is fairly well-controlled on Prozac. He has follow up with his Psychiatrist in May. He is looking for a therapist. His wife [name] continues to work as an Museum/gallery exhibitions officer. She is making overtime pay so they are stable financially. She has to take many precautions at work, including bleaching their boots and removing her uniform before she enters the house. His parents are also living with them.    Key CF History:    Genotype: [id number] / [id number]  CFTR modulator: Symdeko    Last LFT check: 11/18/18 (mild elevation)   CFRD (y/n): Yes  CF liver disease: No  NTM colonization/disease: Yes, colonization     Airway Clearance Modality: Health and safety inspector (recently received and likes a lot)     Compliance with meds and ACT: Good     Past Medical History:   Diagnosis Date   ??? Anxiety    ??? Chronic pain disorder    ??? Cystic fibrosis (CMS-HCC)    ??? Depression    ??? Hypertension    ??? Nonproductive cough 04/05/2018       Past Surgical History:   Procedure Laterality Date   ??? PR REMOVAL OF LUNG,LOBECTOMY Right 03/29/2017    Procedure: REMOVAL OF LUNG, OTHER THAN PNEUMONECTOMY; SINGLE LOBE (LOBECTOMY);  Surgeon: Cherie Dark, MD;  Location: MAIN OR [facility];  Service: Thoracic       Family History   Problem Relation Age of Onset   ??? Bipolar disorder Mother    ??? Depression Mother        Social History     Tobacco Use   ??? Smoking status: Never Smoker   ??? Smokeless tobacco: Never Used   Substance Use Topics   ??? Alcohol use: Yes     Alcohol/week: 3.0 standard drinks     Types: 3 Glasses of wine per week     Comment: 3 bottles of wine 2 days ago   ??? Drug use: No       Allergies as of 12/01/2018 - Reviewed 12/01/2018   Allergen Reaction Noted   ??? [place] [aztreonam lysine] Anaphylaxis 12/27/2016   ??? Cefepime Itching and Nausea Only 09/06/2015   ??? Other Anaphylaxis and Other (See Comments) 09/06/2015   ??? Slo-bid 100 Anaphylaxis 06/20/2017   ??? Banana Itching and Nausea And Vomiting 07/29/2016   ??? Tobramycin Tinnitus 09/06/2015       Current Outpatient Medications   Medication Sig Dispense Refill   ??? albuterol (PROVENTIL HFA;VENTOLIN HFA) 90 mcg/actuation inhaler Inhale 2 puffs every six (6) hours as needed. 1 Inhaler 1   ??? albuterol 2.5 mg/0.5 mL nebulizer solution Inhale 0.5 mL (2.5 mg total) by nebulization every six (6) hours as needed for wheezing. 90 each 3   ??? alteplase (ACTIVASE) 2 mg injection small catheter clearance Infuse 2 mL (2 mg total) into a venous catheter once as needed (if 1mg  dose ineffective in clearing catheter). 1 each 0   ??? blood sugar diagnostic Strp Dispense 150 blood glucose test  strips, ok to sub any brand preferred by insurance/patient, use up to 5x/day, dx E 08.9 450 strip 12   ??? blood-glucose meter kit Dispense meter that is preferred by patient's insurance company 1 each 0   ??? blood-glucose meter,continuous (DEXCOM G6 RECEIVER) Misc Use as directed 1 each 0   ??? blood-glucose sensor (DEXCOM G6 SENSOR) Devi Use sq as directed every 14 days 6 Device 11   ??? blood-glucose transmitter (DEXCOM G6 TRANSMITTER) Devi Use as directed 1 Device 11   ??? budesonide-formoterol (SYMBICORT) 160-4.5 mcg/actuation inhaler Inhale 2 puffs Two (2) times a day. 20 g 11   ??? cetirizine (ZYRTEC) 10 MG tablet TAKE 1 TABLET (10 MG TOTAL) BY MOUTH DAILY. 90 tablet 3   ??? diclofenac sodium (VOLTAREN) 1 % gel Apply 2 g topically Four (4) times a day. (Patient not taking: Reported on 11/18/2018) 100 g 2   ??? dornase alfa (PULMOZYME) 1 mg/mL nebulizer solution Inhale 2.5 mg Two (2) times a day. 450 mL 3   ??? FLUoxetine (PROZAC) 20 MG capsule Take 40 mg by mouth daily.     ??? gabapentin enacarbil (HORIZANT) 600 mg TbER Take 1,200 mg by mouth nightly.      ??? glucagon, human recombinant, (GLUCAGON) 1 mg/ml injection Inject 1 mL (1 mg total) under the skin once as needed (severe hypoglycemia). Follow package directions for low blood sugar. (Patient not taking: Reported on 11/18/2018) 1 mL 11   ??? insulin ASPART (NOVOLOG FLEXPEN) 100 unit/mL (3 mL) injection pen Use up to 150 units/day, divided TID AC meals (Patient taking differently: 60-80 units TID AC) 135 mL 12   ??? insulin glargine (BASAGLAR, LANTUS) 100 unit/mL (3 mL) injection pen Inject 0.7 mL (70 Units total) under the skin daily. 30 mL 0   ??? lamoTRIgine (LAMICTAL) 200 MG tablet Take 200 mg by mouth daily.     ??? lidocaine-prilocaine (EMLA) 2.5-2.5 % cream Apply topically every twelve (12) hours. 30 g 0   ??? lipase-protease-amylase (CREON) 36,000-114,000- 180,000 unit CpDR TAKE 7-8 CAPSULES BY MOUTH WITH MEALS AND 5-6 CAPSULES WITH SNACKS 1800 capsule 3   ??? lisinopriL (PRINIVIL,ZESTRIL) 10 MG tablet Take 1 tablet (10 mg total) by mouth daily. 30 tablet 0   ??? melatonin 10 mg Tab Take 10 mg by mouth nightly.     ??? montelukast (SINGULAIR) 10 mg tablet Take 1 tablet (10 mg total) by mouth nightly. 90 tablet 3   ??? MVW COMPLETE FORMUL PROBIOTIC 40 billion cell -15 mg CpDR Take 1 capsule by mouth daily. 90 capsule 3   ??? MVW COMPLETE FORMULATION D5000 5,000-800 unit-mcg cap Take 2 capsules by mouth daily. 180 capsule 3   ??? nebulizers Misc Use as directed with inhaled medications 4 each 3   ??? omeprazole (PRILOSEC) 20 MG capsule Take 1 capsule (20 mg total) by mouth daily. 30 capsule 0   ??? pen needle, diabetic (BD ULTRA-FINE NANO PEN NEEDLE) 32 gauge x 5/32 Ndle ok to sub any brand or size needle preferred by insurance/patient, use up to 4x/day, dx E 08.9 450 each 12   ??? pramipexole (MIRAPEX) 0.125 MG tablet Take 0.25 mg by mouth daily.      ??? sodium chloride 7% 7 % Nebu Inhale 4 mL by nebulization Two (2) times a day. Increase to 4 times while taking antibiotics (Patient taking differently: Inhale 4 mL by nebulization Two (2) times a day. ) 720 mL 3   ??? tezacaftor 100mg /ivacaftor 150mg  and ivacaftor 150mg  (SYMDEKO) tablets  TAKE BY MOUTH AS DIRECTED ON PACKAGE LABELING 56 each 11   ??? traZODone (DESYREL) 100 MG tablet Take 2 tablets (200 mg total) by mouth nightly. 60 tablet 0   ??? XARELTO 20 mg tablet Take 20 mg by mouth daily.       No current facility-administered medications for this visit.      Physical Exam (as applicable to extent possible): Cooperative male, sitting up in chair. Port present in right upper chest, accessed and dressing in place c/d/i. Conversant, no respiratory distress, normal WOB on room air.      Diagnostic Review:   The following data were reviewed during this video telehealth visit with key findings summarized below:    Pulmonary Function Testing   No new PFT's available.     Last PFT's 10/01/18: FVC 4.09 (70%), FEV1 2.91 (62%)     Laboratory Data  Lab Results   Component Value Date/Time    WBC 7.5 11/25/2018 06:26 AM    HGB 13.0 (L) 11/25/2018 06:26 AM    HCT 39.6 (L) 11/25/2018 06:26 AM    PLT 377 11/25/2018 06:26 AM     Lab Results   Component Value Date/Time    CRP 24.9 (H) 08/15/2018 06:14 AM     Lab Results   Component Value Date/Time    CREATININE 0.96 11/25/2018 06:26 AM    GLU 101 11/25/2018 06:26 AM     Lab Results   Component Value Date/Time    BILITOT 0.5 11/18/2018 03:55 PM    ALKPHOS 110 11/18/2018 03:55 PM    AST 63 (H) 11/18/2018 03:55 PM    ALT 77 (H) 11/18/2018 03:55 PM    ALBUMIN 4.9 11/18/2018 03:55 PM    INR 1.52 11/18/2018 03:55 PM     Lab Results   Component Value Date/Time    A1C 8.5 (H) 08/12/2018 08:24 PM     Lab Results   Component Value Date/Time    VITDTOTAL 27.9 03/19/2017 05:08 AM     No components found for: VITE  No results found for: VITA  Lab Results   Component Value Date/Time    IGE 32.8 06/20/2017 04:34 PM    IGE 23.6 11/18/2016 05:07 PM     Lab Results   Component Value Date/Time    CFSP 1+ Mucoid Pseudomonas aeruginosa (A) 11/20/2018 03:05 PM    CFSP 4+ Oropharyngeal Flora Isolated 11/20/2018 03:05 PM    CFSP 1+ Smooth Pseudomonas aeruginosa (A) 11/20/2018 03:05 PM     Lab Results   Component Value Date/Time    LABGRAM 1+ Polymorphonuclear leukocytes 03/29/2017 01:22 PM    LABGRAM No organisms seen 03/29/2017 01:22 PM     Lab Results   Component Value Date/Time    [facility]  11/20/2018 03:05 PM     NO ACID FAST BACILLI SEEN- 3 negative smears do not exclude pulmonary TB. If active pulmonary TB is suspected, continue airborne isolation until pulmonary disease is excluded by negative cultures. Imaging  I personally reviewed the patient's recent imaging studies.   Last chest x-ray 11/18/18:   FINDINGS: Right-sided Port-A-Cath with distal tip in the right atrium. Right upper lobectomy. Linear super segment right lower lobe scarring unchanged. No new consolidation. No pleural effusion or pneumothorax. Unremarkable cardiomediastinal silhouette.   IMPRESSION: No acute airspace disease.    I spent 23 minutes on the audio/video with the patient. I spent an additional 20 minutes on pre- and post-visit activities.     The patient was physically located in  [facility] [place] or a state in which I am permitted to provide care. The patient understood that s/he may incur co-pays and cost sharing, and agreed to the telemedicine visit. The visit was completed via phone and/or video, which was appropriate and reasonable under the circumstances given the patient's presentation at the time.    The patient has been advised of the potential risks and limitations of this mode of treatment (including, but not limited to, the absence of in-person examination) and has agreed to be treated using telemedicine. The patient's/patient's family's questions regarding telemedicine have been answered.     If the phone/video visit was completed in an ambulatory setting, the patient has also been advised to contact their provider???s office for worsening conditions, and seek emergency medical treatment and/or call 911 if the patient deems either necessary.    Durenda Hurt [place], [place]  12/02/18  12:58 PM

## 2018-12-01 NOTE — Unmapped (Addendum)
Goals and plans we discussed today:  1. Finish up your 2 week course of IV antibiotics and have Home Health deaccess your [place].   2. Continue your home inhaled CF meds and airway clearance. We'll prescribe Tobi Podhaler every other month for next month.   3. You are due for Vitamin A, Vitamin D, Vitamin E and total IgE levels for CF monitoring. Have your PCP check these labs.   4. Increase your sinus saline rinses to daily.    Thank you for allowing [name] to be a part of your care!     To reach your CF nurse coordinators:    Patients with the last name A-K: Joni Reining [phone]  Patients with the last name L-Z: Harriett Sine [phone]     For urgent issues after hours/weekends:  Hospital Operator: [phone]) [phone], ask for Pulmonary Fellow on-call     To make or change a clinic appointment:   [facility] Pulmonary Specialty Clinic: [phone]     --> When you should use MyChart:           - Order a prescription refill          - View test results          - Send a non-urgent message or update to the care team          - View after-visit summaries           - See or pay bills      --> When you should call (NOT use MyChart)           - Increase in cough          - Change in amount of mucus or mucus color           - Coughing up blood or blood-tinged mucus          - Chest pain          - Shortness of breath           - Lack of energy, feeling sick, or increase in tiredness     --> I don't have a MyChart. Why should I get one?           - It's encrypted, so your information is secure          - It's a quick, easy way to contact the care team, manage appointments, see test results, and more!      --> How do I sign-up for MyChart?            - Download the MyChart app from the [employer] or [employer] and sign-up in the app           - Sign-up online at [email or web address]

## 2018-12-02 MED ORDER — TOBI PODHALER 28 MG CAPSULE WITH INHALATION DEVICE: 4 | capsule | Freq: Two times a day (BID) | 3 refills | 0 days | Status: AC

## 2018-12-02 MED ORDER — TOBI PODHALER 28 MG CAPSULE WITH INHALATION DEVICE
ORAL_CAPSULE | Freq: Two times a day (BID) | RESPIRATORY_TRACT | 3 refills | 0.00000 days | Status: CP
Start: 2018-12-02 — End: 2018-12-02

## 2018-12-02 NOTE — Unmapped (Signed)
Outpatient Lab Results from Tuesday, December 02, 2018     Outpatient IV Antimicrobial Therapy:  Start Date: 11/18/18   End Date: 12/02/18 (D14)  Line: PORT  Referring pulmonologist: Maia Plan    IV Antibiotics Ordered:  Tobramycin 220mg  IV every 12 hours (Conventional dosing, infuse over 30 minutes)  Piperacillin-tazobactam (Zosyn) 4.5g IV every 6 hours       GENERAL LABS:  See Care Everywhere, [facility]     CHEM     4/21 Home  4/16 Hosp  4/14   BUN  21 12 12    SCR  0.98 0.97 0.96   MG 1.9 2.0 1.9     CBC    Home  4/21 Home  4/16 [facility]  4/14   WBC 6.4 7.5 7.5   HGB  13.5 13.8  13.0   HCT 41.3 42.0  39.6   PLT  308 387  377     LFTs   Hosp  4/7   AST  63   ALT  77     Date andTime: Measured Drug Levels   4/16 @ 1750: Tobramycin Trough: <0.6  mcg/mL      Patient was called to discuss start and stop time of Tobramycin on date of drug level collection.    Patient Reported Administration Time:   Start of infusion: 0700   End of infusion: 0730        PLAN     Patient voiced being ready to end therapy as scheduled Monday evening or Tuesday morning. Had a virtual appointment with Durenda Hurt on 12/01/18.     1. Results note forwarded to: Maia Plan  2. Drug levels forwarded to CPP N/A    Cicero Duck, RN

## 2018-12-02 NOTE — Unmapped (Signed)
[facility] Specialty Medication Referral: [facility] Ineligible - Provider Action Required     Medication (Brand/Generic): Tobi Podhaler     Initial Benefits Investigation Claim completed with resulted information below:   PA Required    Insurance Plan: [employer]  Phone Number: [phone]    Patient UNABLE to fill at [facility]    Required Specialty Pharmacy per insurance: [facility]       Please route the prescription to the required pharmacy via:   Fax: Not provider   Phone: [phone]    ______________________________________________________________________    This referral has been handed off to prescribing provider to assist with further processing of this order.    ______________________________________________________________________  Please utilize this referral for viewing purposes as it will serve as the central location for all relevant documentation and updates.

## 2018-12-03 NOTE — Unmapped (Signed)
Calling to check on patient, they flagged as yellow non-responder on [facility].   Patient did not answer. Left message to call back and sent message via My Chart.

## 2018-12-09 NOTE — Unmapped (Signed)
[facility] Refill Coordination Note    Specialty Medication(s) to be Shipped: N/A      Other medication(s) to be shipped: N/A    Declined: symdeko, has enough on hand     Christian Young, DOB: 04-Oct-1985  Phone: [phone] (home)       All above HIPAA information was verified with patient.     Completed refill call assessment today to schedule patient's medication shipment from the [facility] [phone]).       Specialty medication(s) and dose(s) confirmed: Regimen is correct and unchanged.   Changes to medications: Raife reports no changes reported at this time.  Changes to insurance: No  Questions for the pharmacist: No    Confirmed patient received Welcome Packet with first shipment. The patient will receive a drug information handout for each medication shipped and additional FDA Medication Guides as required.       DISEASE/MEDICATION-SPECIFIC INFORMATION        For CF patients: CF [facility] Active? Yes    SPECIALTY MEDICATION ADHERENCE     Medication Adherence    Patient reported X missed doses in the last month:  0  Specialty Medication:  symdeko  Patient is on additional specialty medications:  No  Patient is on more than two specialty medications:  No  Any gaps in refill history greater than 2 weeks in the last 3 months:  no  Support network for adherence:  family member                symdeko 150 mg: 30 days of medicine on hand         SHIPPING     Shipping address confirmed in Epic.     Delivery Scheduled: Yes, Expected medication delivery date: N/A.     Medication will be delivered via N/A to the N/A address in Epic WAM.    Christian Young   [facility] Specialty Technician

## 2018-12-10 NOTE — Unmapped (Signed)
Spoke to Stanley after he left [name] a VM on the CF nurse line. He was started on Atorvastatin 20 mg daily by his PCP for high cholesterol, and he wanted to make sure it was safe with his other CF medications. I told him I'd run it by our CF Pharmacist and send him a MyChart message. Discussed that exercise is also good for improving cholesterol. He has been walking daily. Informed him of the free membership through [employer] for [email or web address].     Also, informed him we haven't received his labs from the PCP. He will call them to check.     Verlin Fester, PA-C  Pulmonary/CF Team  [phone]

## 2018-12-10 NOTE — Unmapped (Signed)
Adult Cystic Fibrosis Clinic Pharmacist Note     Date of Service: December 10, 2018    Purpose of contact: Medication Consult  ? Asked to review if any interactions/issues with starting atorvastatin 20mg  daily with current medication regimen.  ? Reviewed Christian Young's medication list, no significant interactions identified.  ? Upon review of medication list, removed IV antibiotics as he's completed therapy and from outside dispense records, he has been filling amitriptyline 25mg  nightly, added to medication list.    Electronically signed:  [name], PharmD, BCPS, CPP  Clinical Pharmacist Practitioner  [facility]    CC:   ? Durenda Hurt Rupcich, PA-C

## 2018-12-11 MED ORDER — BETHKIS 300 MG/4 ML SOLUTION FOR NEBULIZATION
Freq: Two times a day (BID) | RESPIRATORY_TRACT | 6 refills | 0.00000 days | Status: CP
Start: 2018-12-11 — End: 2019-12-11

## 2018-12-11 NOTE — Unmapped (Signed)
Received lab results from PCP, drawn 12/05/18. Patient recently finished IV abx and is back to baseline. He was started on a statin by his PCP after labs returned.     Vitamin A - 48  Vitamin D, 25OH - 30.9  Vitamin E, alpha - 18.6  Vitamin E, gamma - 1.6    Hgb A1c - 7.6%    Total Chol - 259  Triglycerides - 253  HDL - 31  LDL - 177    BUN - 14  Cr - 0.93  Na - 135  K - 4.8  Tbili - 0.3  Alk Phos - 79  AST - 40  ALT - 52    WBC - 7.4  Hgb - 14.4  Plt - 377     Durenda Hurt [place], [place]  Pulmonary/CF Team  [phone]

## 2018-12-11 NOTE — Unmapped (Signed)
Nutrition - Brief:    Annual labs completed 4/24 at [facility]. Vitamin A, D and E all within normal limits. Pt informed via MyChart; encouraged to continue current vitamin regimen.     Total cholesterol and LDL cholesterol elevated. Cholesterol-lowering medical nutrition therapy information sent via MyChart. Requested pt reach out to discuss in more detail if desired.     Jackqulyn Livings MPH, RD, LDN  Pager: [phone]

## 2018-12-11 NOTE — Unmapped (Signed)
[facility]  Adult Cystic Fibrosis Clinic    As part of [facility] CF [employer] Program, MSW mailed patient [phone] gift card for food due to financial difficulties and associated food insecurity.

## 2018-12-12 MED ORDER — AMOXICILLIN 875 MG-POTASSIUM CLAVULANATE 125 MG TABLET
ORAL_TABLET | Freq: Two times a day (BID) | ORAL | 0 refills | 0.00000 days | Status: CP
Start: 2018-12-12 — End: 2018-12-19

## 2018-12-12 NOTE — Unmapped (Signed)
Thanks

## 2018-12-12 NOTE — Unmapped (Signed)
Rec'd vm message fr pt stating that he has a horrible earache in his right ear and has been having headaches.  He feels that he has an ear infection and is asking for medication to treat same and also for medication for his ear pain.    Call placed to pt for further details re ear pain.  Left vm message as no answer.

## 2018-12-12 NOTE — Unmapped (Signed)
Spoke to [name] on 12/12/18. He contacted [name] because his right ear is hurting pretty badly. It has been gradually getting worse for the past 1 week. He states that it hurts on the inside and outside, and it is sensitive to touch on the outside. He denies any drainage from his ear. He has a history of frequent ear infections. He reports having 2-3 headaches per day. They are located in the back of his head. They respond usually to Tylenol or Ibuprofen. He has nasty stuff coming from his nose that started yesterday. He has pressure in his forehead on the right side and cheeks bilaterally. He is taking Zyrtec, Flonase, and sinus rinses daily. He went to his PCP about a week ago and was told that his right inner ear looked cloudy but he wasn't given any treatment at that time. He had an ear infection in Jan/Feb and believes he was prescribed Augmentin. He saw a local ENT earlier this year and wax was removed from his ears. He was told he has a deviated septum.     [name] to contact his PCP today because she evaluated him in person last week. He reports that they closed at noon today but he will contact them on Monday. Due to this, I will prescribed Augmentin BID x 7 days today. Advised him to contact his PCP first thing Monday morning and to also update on Monday as well. He was agreeable to this.     Christian Fester, PA-C  Pulmonary/CF Team  [phone]

## 2018-12-16 NOTE — Unmapped (Signed)
Patient with recent AFB positive culture. Dr. [name] aware and requesting additional sputum with sensitivity. Standing orders placed through [facility]. Will also order through [facility] if it is easier for the patient to deliver a sample there.     [name] contacted via MyChart with this request.    [name]. [name], RN  CF Nurse Coordinator   [phone]  12/16/18 9:25 AM

## 2019-01-07 NOTE — Unmapped (Signed)
[facility] Refill Coordination Note    Specialty Medication(s) to be Shipped:   CF/Pulmonary: -SYMDEKO (tezacaftor 100mg /ivacaftor 150mg  and ivacaftor 150mg ) tablets    Other medication(s) to be shipped: N/A     Christian Young, DOB: May 25, 1986  Phone: [phone] (home)       All above HIPAA information was verified with patient.     Completed refill call assessment today to schedule patient's medication shipment from the [facility] [phone]).       Specialty medication(s) and dose(s) confirmed: Regimen is correct and unchanged.   Changes to medications: Christian Young reports no changes reported at this time.  Changes to insurance: No  Questions for the pharmacist: No    Confirmed patient received Welcome Packet with first shipment. The patient will receive a drug information handout for each medication shipped and additional FDA Medication Guides as required.       DISEASE/MEDICATION-SPECIFIC INFORMATION        For CF patients: CF [facility] Active? Yes    SPECIALTY MEDICATION ADHERENCE     Medication Adherence    Patient reported X missed doses in the last month:  0  Specialty Medication:  symdeko  Patient is on additional specialty medications:  No  Any gaps in refill history greater than 2 weeks in the last 3 months:  no  Support network for adherence:  family member                symdeko 150 mg: 14 days of medicine on hand         SHIPPING     Shipping address confirmed in Epic.     Delivery Scheduled: Yes, Expected medication delivery date: 01/16/2019.     Medication will be delivered via UPS to the home address in Epic [place].    Christian Young Christian Young   [facility] Specialty Technician

## 2019-01-13 NOTE — Unmapped (Signed)
[facility]  Adult Cystic Fibrosis Clinic    As part of [facility] CF [employer] Program, MSW mailed patient [phone] gift card for food due to financial difficulties and associated food insecurity.

## 2019-01-15 MED FILL — SYMDEKO 100 MG-150 MG (DAY)/150 MG (NIGHT) TABLETS: 28 days supply | Qty: 56 | Fill #2

## 2019-01-15 MED FILL — SYMDEKO 100 MG-150 MG (DAY)/150 MG (NIGHT) TABLETS: 28 days supply | Qty: 56 | Fill #2 | Status: AC

## 2019-01-19 NOTE — Unmapped (Signed)
Christian Young left a message with the CF nurse coordinator office. States he has not been doing well. He is coughing up really dark sputum and having fevers up to 99.63F. His plan is to come to go to [facility] ED tomorrow when his wife is off work.     Spoke with Christian Young about his condition. Symptoms started last week, he tried over the counter stuff first. He then went to see his primary care, given amoxacillin, as they thought it was a sinus infection. This was not effective. Current temp is 99.63F, has not gotten higher. Symptoms have moved to his chest, with more cough, and what he coughs up is very green grossness.  Will get SOB with activity, but is not SOB when sitting. Not on oxygen.     Feels like it is time for IV antibiotics. His wife says she is worried by his cough.      Agree with the plan to go to [facility]. Wife will gets back from 24 hr shift as EMT tomorrow morning and can take him to the hospital around 1000.    Will route to Dr. [name] and Durenda Hurt Rupcich for formalizing a plan for admission.     [name], RN  CF Nurse Coordinator   [phone]

## 2019-01-20 ENCOUNTER — Ambulatory Visit
Admit: 2019-01-20 | Discharge: 2019-02-01 | Disposition: A | Discharge status: Home / Self Care | Payer: PRIVATE HEALTH INSURANCE

## 2019-01-20 LAB — CBC W/ AUTO DIFF
BASOPHILS ABSOLUTE COUNT: 0.1 10*9/L (ref 0.0–0.1)
BASOPHILS RELATIVE PERCENT: 0.5 %
EOSINOPHILS ABSOLUTE COUNT: 0.3 10*9/L (ref 0.0–0.4)
EOSINOPHILS RELATIVE PERCENT: 2.5 %
HEMOGLOBIN: 13.2 g/dL — ABNORMAL LOW (ref 13.5–17.5)
LARGE UNSTAINED CELLS: 2 % (ref 0–4)
LYMPHOCYTES ABSOLUTE COUNT: 1.8 10*9/L (ref 1.5–5.0)
MEAN CORPUSCULAR HEMOGLOBIN: 27.4 pg (ref 26.0–34.0)
MEAN CORPUSCULAR VOLUME: 83 fL (ref 80.0–100.0)
MEAN PLATELET VOLUME: 6.7 fL — ABNORMAL LOW (ref 7.0–10.0)
MONOCYTES ABSOLUTE COUNT: 0.5 10*9/L (ref 0.2–0.8)
MONOCYTES RELATIVE PERCENT: 4.9 %
NEUTROPHILS ABSOLUTE COUNT: 7.2 10*9/L (ref 2.0–7.5)
NEUTROPHILS RELATIVE PERCENT: 71.9 %
PLATELET COUNT: 312 10*9/L (ref 150–440)
RED BLOOD CELL COUNT: 4.81 10*12/L (ref 4.50–5.90)
RED CELL DISTRIBUTION WIDTH: 15.8 % — ABNORMAL HIGH (ref 12.0–15.0)
WBC ADJUSTED: 10 10*9/L (ref 4.5–11.0)

## 2019-01-20 LAB — BASIC METABOLIC PANEL
ANION GAP: 12 mmol/L (ref 7–15)
BLOOD UREA NITROGEN: 13 mg/dL (ref 7–21)
BUN / CREAT RATIO: 22
CALCIUM: 8.9 mg/dL (ref 8.5–10.2)
CHLORIDE: 101 mmol/L (ref 98–107)
CO2: 26 mmol/L (ref 22.0–30.0)
CREATININE: 0.59 mg/dL — ABNORMAL LOW (ref 0.70–1.30)
EGFR CKD-EPI NON-AA MALE: >90 mL/min/{1.73_m2} (ref >=60)
GLUCOSE RANDOM: 152 mg/dL (ref 70–179)
POTASSIUM: 3.9 mmol/L (ref 3.5–5.0)
SODIUM: 139 mmol/L (ref 135–145)

## 2019-01-20 LAB — MONOCYTES RELATIVE PERCENT: Lab: 4.9

## 2019-01-20 LAB — SODIUM: Sodium:SCnc:Pt:Ser/Plas:Qn:: 139

## 2019-01-20 NOTE — Unmapped (Signed)
Admission scheduled: ER admit after COVID testing      Primary Pulmonary Physician: Maia Plan    Reason for Admission: possible CF exacerbation    Antibiotics: Based on prior culture data (smooth and mucoid pseudomonas) and allergy profile, favor treating with Zoysn + Tobramycin ( Towards day 12 of Zosyn last time, he did have probably an AIN with increased peripheral eosinophil count, but, zosyn was continued for todal 14 days, and counts improved after discontinuation.   Patient should not get need azithromycin during admission. He was previously treated for MAC. 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 Sensitivities pending. Would be glad if we could send another sample this time. Will plan the management for the same outpatient.    Other medications: To be continued as is    Airway clearance: Airway clearance with hypertonic saline, pre-treatment with albuterol. For mechanical clearance, prefer Vest 2 times a day and Aerobika 2 times a day.      Consults: Pulmonary    Labs: CBC, CMP, COVID, CF sputum culture, AFB culture    Imaging:  CXR, if concerning then CT non con        Dispo: Patient does not need to remain in the hospital for the entire course of IV antibiotics.       Maia Plan, MD  PGY 4, Pulmonary and Critical Care  Pager: [phone]  January 20, 2019 4:37 PM \

## 2019-01-20 NOTE — Unmapped (Signed)
Report called to RT at [facility] that the patient will come to the ED today to be admitted. Discussed airway clearance. RT management also notified. Will continue to monitor.

## 2019-01-20 NOTE — Unmapped (Signed)
Patient presents to the ED with cystic fibrosis flare that started one week ago. Patient states one documented fever during this time. Patient able to speak in complete sentences without difficulty.

## 2019-01-20 NOTE — Unmapped (Signed)
[facility]  Emergency Department Provider Note  ED Clinical Impression     Final diagnoses:   Cystic fibrosis exacerbation (CMS-HCC) (Primary)       Initial Impression, ED Course, Assessment and Plan     33 year old male with a past medical history of cystic fibrosis, anxiety and depression presenting to the emergency department for evaluation of worsening cough, congestion and trouble breathing.  Also endorsing myalgias.  On exam, overall well-appearing no acute distress.  Lungs clear and abdomen soft.  He does endorse some chest wall pain from significant bout of coughing.  Plan for labs, sputum sample, will speak with the admitting team.    5:54 PM  The patient's COVID is negative.  I spoke with the admitting team.  They will admit the patient to their service.    ____________________________________________    Time seen: January 20, 2019 3:02 PM    I have reviewed the triage vital signs and the nursing notes.  I have discussed the case with the ED Attending, Dr. Izola Price.     Labs and radiology results that were available during my care of the patient were independently reviewed by me and considered in my medical decision making.    Portions of this record have been created using Scientist, clinical (histocompatibility and immunogenetics). Dictation errors have been sought, but may not have been identified and corrected.    History     Chief Complaint  Shortness of Breath      HPI   Christian Young is a 33 y.o. male with a past medical history of cystic fibrosis, depression, anxiety presenting to the emergency department for evaluation of cough, body aches and difficulty breathing as well as increased sputum production over the past week.  Patient states that he has been doing his chest vest as instructed.  He does states that he is having increasing.  He does endorse having fevers and body aches as well.  No sore throat vomiting or diarrhea.  He has not been tested for COVID in the past few months.  However, no known sick contacts. Patient was instructed to come to the emergency department today for admission.        Past Medical History:   Diagnosis Date   ??? Anxiety    ??? Chronic pain disorder    ??? Cystic fibrosis (CMS-HCC)    ??? Depression    ??? Hypertension    ??? Nonproductive cough 04/05/2018       Past Surgical History:   Procedure Laterality Date   ??? PR REMOVAL OF LUNG,LOBECTOMY Right 03/29/2017    Procedure: REMOVAL OF LUNG, OTHER THAN PNEUMONECTOMY; SINGLE LOBE (LOBECTOMY);  Surgeon: Cherie Dark, MD;  Location: MAIN OR [facility];  Service: Thoracic          Current Facility-Administered Medications:   ???  MORPhine 4 mg/mL injection 4 mg, 4 mg, Intravenous, Once, Verlin Dike, MD    Current Outpatient Medications:   ???  albuterol (PROVENTIL HFA;VENTOLIN HFA) 90 mcg/actuation inhaler, Inhale 2 puffs every six (6) hours as needed., Disp: 1 Inhaler, Rfl: 1  ???  albuterol 2.5 mg/0.5 mL nebulizer solution, Inhale 0.5 mL (2.5 mg total) by nebulization every six (6) hours as needed for wheezing., Disp: 90 each, Rfl: 3  ???  amitriptyline (ELAVIL) 25 MG tablet, Take 25 mg by mouth nightly., Disp: , Rfl:   ???  blood sugar diagnostic Strp, Dispense 150 blood glucose test strips, ok to sub any brand preferred by insurance/patient, use  up to 5x/day, dx E 08.9, Disp: 450 strip, Rfl: 12  ???  blood-glucose meter kit, Dispense meter that is preferred by [employer], Disp: 1 each, Rfl: 0  ???  blood-glucose meter,continuous (DEXCOM G6 RECEIVER) Misc, Use as directed, Disp: 1 each, Rfl: 0  ???  blood-glucose sensor (DEXCOM G6 SENSOR) Devi, Use sq as directed every 14 days, Disp: 6 Device, Rfl: 11  ???  blood-glucose transmitter (DEXCOM G6 TRANSMITTER) Devi, Use as directed, Disp: 1 Device, Rfl: 11  ???  budesonide-formoterol (SYMBICORT) 160-4.5 mcg/actuation inhaler, Inhale 2 puffs Two (2) times a day., Disp: 20 g, Rfl: 11  ???  cetirizine (ZYRTEC) 10 MG tablet, TAKE 1 TABLET (10 MG TOTAL) BY MOUTH DAILY., Disp: 90 tablet, Rfl: 3  ???  diclofenac sodium (VOLTAREN) 1 % gel, Apply 2 g topically Four (4) times a day. (Patient not taking: Reported on 11/18/2018), Disp: 100 g, Rfl: 2  ???  dornase alfa (PULMOZYME) 1 mg/mL nebulizer solution, Inhale 2.5 mg Two (2) times a day., Disp: 450 mL, Rfl: 3  ???  EASY TOUCH LANCING DEVICE Misc, Use as directed., Disp: , Rfl:   ???  EASY TOUCH TWIST LANCETS 30 gauge Misc, Use as directed., Disp: , Rfl:   ???  FLUoxetine (PROZAC) 20 MG capsule, Take 40 mg by mouth daily., Disp: , Rfl:   ???  gabapentin enacarbil (HORIZANT) 600 mg TbER, Take 1,200 mg by mouth nightly. , Disp: , Rfl:   ???  glucagon, human recombinant, (GLUCAGON) 1 mg/ml injection, Inject 1 mL (1 mg total) under the skin once as needed (severe hypoglycemia). Follow package directions for low blood sugar. (Patient not taking: Reported on 11/18/2018), Disp: 1 mL, Rfl: 11  ???  insulin ASPART (NOVOLOG FLEXPEN) 100 unit/mL (3 mL) injection pen, Use up to 150 units/day, divided TID AC meals (Patient taking differently: 60-80 units TID AC), Disp: 135 mL, Rfl: 12  ???  insulin glargine (BASAGLAR, LANTUS) 100 unit/mL (3 mL) injection pen, Inject 0.7 mL (70 Units total) under the skin daily., Disp: 30 mL, Rfl: 0  ???  lamoTRIgine (LAMICTAL) 200 MG tablet, Take 200 mg by mouth daily., Disp: , Rfl:   ???  lidocaine-prilocaine (EMLA) 2.5-2.5 % cream, Apply topically every twelve (12) hours., Disp: 30 g, Rfl: 0  ???  lipase-protease-amylase (CREON) 36,000-114,000- 180,000 unit CpDR, TAKE 7-8 CAPSULES BY MOUTH WITH MEALS AND 5-6 CAPSULES WITH SNACKS, Disp: 1800 capsule, Rfl: 3  ???  lisinopriL (PRINIVIL,ZESTRIL) 10 MG tablet, Take 1 tablet (10 mg total) by mouth daily., Disp: 30 tablet, Rfl: 0  ???  melatonin 10 mg Tab, Take 10 mg by mouth nightly., Disp: , Rfl:   ???  montelukast (SINGULAIR) 10 mg tablet, Take 1 tablet (10 mg total) by mouth nightly., Disp: 90 tablet, Rfl: 3  ???  MVW COMPLETE FORMUL PROBIOTIC 40 billion cell -15 mg CpDR, Take 1 capsule by mouth daily., Disp: 90 capsule, Rfl: 3  ???  MVW COMPLETE FORMULATION D5000 5,000-800 unit-mcg cap, Take 2 capsules by mouth daily., Disp: 180 capsule, Rfl: 3  ???  nebulizers Misc, Use as directed with inhaled medications, Disp: 4 each, Rfl: 3  ???  omeprazole (PRILOSEC) 20 MG capsule, Take 1 capsule (20 mg total) by mouth daily., Disp: 30 capsule, Rfl: 0  ???  pen needle, diabetic (BD ULTRA-FINE NANO PEN NEEDLE) 32 gauge x 5/32 Ndle, ok to sub any brand or size needle preferred by insurance/patient, use up to 4x/day, dx E 08.9, Disp: 450 each, Rfl:  12  ???  pramipexole (MIRAPEX) 0.125 MG tablet, Take 0.25 mg by mouth daily. , Disp: , Rfl:   ???  sodium chloride 7% 7 % Nebu, Inhale 4 mL by nebulization Two (2) times a day. Increase to 4 times while taking antibiotics (Patient taking differently: Inhale 4 mL by nebulization Two (2) times a day. ), Disp: 720 mL, Rfl: 3  ???  tezacaftor 100mg /ivacaftor 150mg  and ivacaftor 150mg  (SYMDEKO) tablets, TAKE BY MOUTH AS DIRECTED ON PACKAGE LABELING, Disp: 56 each, Rfl: 11  ???  tobramycin (BETHKIS) 300 mg/4 mL Nebu, Inhale 300 mg every twelve (12) hours. For 28 days, alternating every other 28 days, Disp: 224 mL, Rfl: 6  ???  traZODone (DESYREL) 100 MG tablet, Take 2 tablets (200 mg total) by mouth nightly., Disp: 60 tablet, Rfl: 0  ???  XARELTO 20 mg tablet, Take 20 mg by mouth daily., Disp: , Rfl:     Allergies  Cayston [aztreonam lysine]; Cefepime; Other; Slo-bid 100; Banana; and Tobramycin    Family History   Problem Relation Age of Onset   ??? Bipolar disorder Mother    ??? Depression Mother        Social History  Social History     Tobacco Use   ??? Smoking status: Never Smoker   ??? Smokeless tobacco: Never Used   Substance Use Topics   ??? Alcohol use: Yes     Alcohol/week: 3.0 standard drinks     Types: 3 Glasses of wine per week     Comment: 3 bottles of wine 2 days ago   ??? Drug use: No       Review of Systems   Constitutional: Negative for fever positive chills.  Eyes: Negative for visual changes.  ENT: Negative for sore throat.  Cardiovascular: Negative for chest pain.  Respiratory: Positive for shortness of breath.  Gastrointestinal: Negative for abdominal pain, vomiting or diarrhea.  Genitourinary: Negative for dysuria.  Musculoskeletal: Negative for back pain.  Skin: Negative for rash.  Neurological: Negative for headaches, focal weakness or numbness.      Physical Exam     VITAL SIGNS:    ED Triage Vitals [01/20/19 1418]   Enc Vitals Group      BP 143/89      Heart Rate 114      SpO2 Pulse       Resp 16      Temp 36.1 ??C (97 ??F)      Temp Source Oral      SpO2 95 %       General: In no acute distress  Eyes: Conjunctivae are normal.  ENT       Head: Normocephalic and atraumatic.       Nose: No congestion.       Mouth/Throat: Mucous membranes are moist.       Neck: No stridor.  Hematological/Lymphatic/Immunilogical: No cervical lymphadenopathy.  Cardiovascular: Regular rate and rhythm, Normal S1 and S2. No murmurs appreciated.  Respiratory: Normal respiratory effort. Breath sounds are equal bilaterally. No wheezes or crackles.   Gastrointestinal: Soft and nontender.   Musculoskeletal: Nontender with normal range of motion in all extremities.       Right lower leg: No tenderness or edema.       Left lower leg: No tenderness or edema.  Neurologic: Normal speech and language. No gross focal neurologic deficits are appreciated.  Skin: Skin is warm, dry and intact. No rash noted.  Psychiatric: Mood and affect are normal. Speech and  behavior are normal.         Verlin Dike, MD  Resident  01/20/19 [phone]

## 2019-01-21 LAB — CBC
HEMATOCRIT: 37.4 % — ABNORMAL LOW (ref 41.0–53.0)
MEAN CORPUSCULAR HEMOGLOBIN CONC: 33.9 g/dL (ref 31.0–37.0)
MEAN CORPUSCULAR HEMOGLOBIN: 28.3 pg (ref 26.0–34.0)
MEAN CORPUSCULAR VOLUME: 83.4 fL (ref 80.0–100.0)
MEAN PLATELET VOLUME: 7 fL (ref 7.0–10.0)
PLATELET COUNT: 196 10*9/L (ref 150–440)
RED BLOOD CELL COUNT: 4.49 10*12/L — ABNORMAL LOW (ref 4.50–5.90)
RED CELL DISTRIBUTION WIDTH: 15.4 % — ABNORMAL HIGH (ref 12.0–15.0)
WBC ADJUSTED: 7.8 10*9/L (ref 4.5–11.0)

## 2019-01-21 LAB — COMPREHENSIVE METABOLIC PANEL
ALBUMIN: 4 g/dL (ref 3.5–5.0)
ALKALINE PHOSPHATASE: 78 U/L (ref 38–126)
ALT (SGPT): 63 U/L — ABNORMAL HIGH (ref <50)
ANION GAP: 11 mmol/L (ref 7–15)
AST (SGOT): 43 U/L (ref 19–55)
BILIRUBIN TOTAL: 0.6 mg/dL (ref 0.0–1.2)
BLOOD UREA NITROGEN: 11 mg/dL (ref 7–21)
BUN / CREAT RATIO: 15
CALCIUM: 8.5 mg/dL (ref 8.5–10.2)
CHLORIDE: 102 mmol/L (ref 98–107)
CO2: 27 mmol/L (ref 22.0–30.0)
CREATININE: 0.73 mg/dL (ref 0.70–1.30)
EGFR CKD-EPI AA MALE: >90 mL/min/{1.73_m2} (ref >=60)
EGFR CKD-EPI NON-AA MALE: >90 mL/min/{1.73_m2} (ref >=60)
GLUCOSE RANDOM: 107 mg/dL (ref 70–179)
POTASSIUM: 4.5 mmol/L (ref 3.5–5.0)
PROTEIN TOTAL: 6.5 g/dL (ref 6.5–8.3)
SODIUM: 140 mmol/L (ref 135–145)

## 2019-01-21 LAB — BILIRUBIN TOTAL: Bilirubin:MCnc:Pt:Ser/Plas:Qn:: 0.6

## 2019-01-21 LAB — MEAN CORPUSCULAR HEMOGLOBIN: Lab: 28.3

## 2019-01-21 NOTE — Unmapped (Signed)
Care Management  Initial Transition Planning Assessment              General  Care Manager assessed the patient by : (Telephone conversation with pt. Pt agreed to have assessment done by phone.)  Orientation Level: Oriented X4  Who provides care at home?: N/A  Reason for referral: Discharge Planning, Home Health   Type of Residence: Mailing Address:  [address]  [address]  [place] [address]  Contacts: Extended Emergency Contact Information  Primary Emergency Contact: Carmin [place]  Mobile Phone: [phone]  Relation: Spouse  Patient Phone Number: [phone] (home)         Medical Provider(s): [facility]  Reason for Admission: Admitting Diagnosis:  No admission diagnoses are documented for this encounter.  Past Medical History:   has a past medical history of Anxiety, Chronic pain disorder, Cystic fibrosis (CMS-HCC), Depression, Hypertension, and Nonproductive cough (04/05/2018).  Past Surgical History:   has a past surgical history that includes pr removal of lung,lobectomy (Right, 03/29/2017).   Previous admit date: 11/18/2018    Primary Insurance- Payor: Medical sales representative / Plan: [employer] CT GEN CHOICE FUND OA PLUS / Product Type: *No Product type* /   Secondary Insurance ??? None  Prescription Coverage ??? same as above  Preferred Pharmacy - [facility] [place], [place] - [address]  [facility]  [facility] [place], [place] - [address]    Transportation home: Private vehicle  Level of function prior to admission: Independent    Contact/Decision Maker  Extended Emergency Contact Information  Primary Emergency Contact: Griffin,Alyson   [place] Phone: [phone]  Relation: Spouse    Legal Next of Kin / Guardian / POA / Advance Directives     Advance Directive (Medical Treatment)  Reason patient does not have an advance directive covering medical treatment:: Patient does not wish to complete one at this time.     Patient Information  Lives with: Spouse/significant other, Parent(Pt and wife live with his parents.)    Type of Residence: Private residence     Location/Detail: [employer], [place], [place]    Support Systems: Family Members, Parent, Spouse    Responsibilities/Dependents at home?: No    Home Care services in place prior to admission?: No    Outpatient/Community Resources in place prior to admission: Clinic  Agency detail (Name/Phone #): [facility]    Equipment Currently Used at Home: respiratory supplies  Current HME Agency (Name/Phone #): Nebulizer and chest vest therapy    Currently receiving outpatient dialysis?: No    Financial Information    Need for financial assistance?: No    Social Determinants of Health  Social Determinants of Health were addressed in provider documentation.  Please refer to patient history.    Discharge Needs Assessment  Concerns to be Addressed: care coordination/care conferences    Clinical Risk Factors: Multiple Diagnoses (Chronic)    Barriers to taking medications: No    Prior overnight hospital stay or ED visit in last 90 days: No    Readmission Within the Last 30 Days: no previous admission in last 30 days    Anticipated Changes Related to Illness: none    Equipment Needed After Discharge: none    Discharge Facility/Level of Care Needs: other (see comments)(HI/HH)    Readmission  Risk of Unplanned Readmission Score:  %  Predictive Model Details   No score  data available for [facility] Risk of Unplanned Readmission     Readmitted Within the Last 30 Days? (No if blank)   Patient at risk for readmission?: Yes    Discharge Plan  Screen findings are: Discharge planning needs identified or anticipated (Comment).    Expected Discharge Date:      Expected Transfer from Critical Care:      Patient and/or family were provided with choice of facilities / services that are available and appropriate to meet post hospital care needs?: Yes   List choices in order highest to lowest preferred, if applicable. : Will use [facility] again for HI/HH services    Initial Assessment complete?: Yes

## 2019-01-21 NOTE — Unmapped (Signed)
Aminoglycoside Therapeutic Monitoring Pharmacy Note    Christian Young is a 33 y.o. male starting tobramycin. Date of therapy initiation: 01/20/19    Indication: CF exacerbation    Prior Dosing Information: Previous regimen 240-220 mg IV q 12 hours  (conventional dosing, infuse over 30 minutes)    Goals:  Therapeutic Drug Levels  ?? Trough level: tobramycin <1 mg/L  ?? Peak level: tobramycin 10-14 mg/L    Additional Clinical Monitoring/Outcomes  Renal function, volume status (intake and output)    Results:   ?? Not applicable   ?? Not applicable    Wt Readings from Last 1 Encounters:   01/20/19 (!) 117.9 kg (260 lb)     Lab Results   Component Value Date    CREATININE 0.59 (L) 01/20/2019       Pharmacokinetic Considerations and Significant Drug Interactions:  ? Adult (estimated initial): Vd = 30.9 L, ke = 0.3 hr-1  ? Concurrent nephrotoxic meds: zosyn    Assessment/Plan:  Recommendation(s)  ? Start 240 mg IV q 12 hours  ? Estimated peak and trough on recommended regimen: peak = 7.4 mg/L, trough = 0.2 mg/L    Follow-up  ? Level due: peak and trough levels around the fourth or fifth dose  ? A pharmacist will continue to monitor and order levels as appropriate    Please page service pharmacist with questions/clarifications.    Nino Parsley, PharmD

## 2019-01-21 NOTE — Unmapped (Signed)
I left pt a [facility] offering DVV/phone for visit on 01/27/19.

## 2019-01-21 NOTE — Unmapped (Addendum)
Cystic Fibrosis Nutrition Assessment  *Patient on contact precautions related to CF, nutrition assessment conducted by telephone to preserve PPE considering COVID-19 pandemic*    Inpatient: Nutrition Risk Screening this admission for CF and related follow up  Primary Pulmonologist: Dr. [name]  ===================================================================  Christian Young is a 33 y.o. male currently admitted with CF exacerbation for IV antibiotics.  - No answer to phone calls x 2 attempts today.  - Home enzyme Creon 36,000 not on inpatient drug formulary, okay to sub with Creon 24,000 (at adjusted dose) during admission.  ===================================================================  INTERVENTION  1. During admission continue Creon 24,000 (12 at meals, 9 at snacks).  - Can resume home Creon 36,000 after discharge.    2. Recommend adjust vitamin regimen:  - STOP adult multivitamin  - START CF vitamin and supplemental vitamin D:  MVW Complete Formulation gel cap 2 daily and 6000 International units vitamin D daily daily. This regimen will provide a total of 9000 International units vitamin D daily.  - When discharged home to resume usual Complete Formulation D5000 softgels, 2 daily. Which provides total of 10,000 International units vitamin D daily.    3. Weigh patient twice weekly this admit.    4. Recommend check PT. If elevated start 5mg  phytonadione daily x 5 days, then recheck PT. If WNL start 5mg  phytonadione twice weekly while on IV antibiotics.    5. Continue remainder of nutrition regimen:  - acid reducer  - regular diet with double portions  - insulin as appropriate    Inpatient:   Will follow up with patient per protocol: 1-2 times per week (and more frequent as indicated)===================================================================  ASSESSMENT:  Cystic Fibrosis Nutrition Category = Outstanding    Current diet is appropriate for CF. Patient continues to work towards goals for weight management.   Enzyme dose is within established guidelines. Vitamin prescription is not appropriate to reach/maintain optimal fat soluble vitamin levels. Patient would benefit from change in vitamin regimen. Sodium needs for CF met with PO and/or supplement. Bowel regimen is appropriate. Acid reducer appropriate for GERD and enzyme activation.    Malnutrition Assessment using AND/ASPEN Clinical Characteristics:  Nutrition focused physical exam not completed as visit today is via phone in an effort to minimize contact between patient and provider due to COVID-19 pandemic.    Goals:  1. Meet estimated daily needs: 2800-3276 kcals (24-28 kcals/kg/day); 140-187 gm pro (DRI x 1.5 -2); 3440 mL free water (Holliday Segar Method)  2. Reach/maintain established goals for CF:                Adult - BMI 22kg/m2 for CF females and 23kg/m2 for CF males  3. Normal fat-soluble vitamin levels: Vitamin A, Vitamin E and PT per lab range; Vitamin D 25OH total >30  4. Maintain glucose control. Carbohydrate content of diet should comprise 40-50% of total calorie needs, but carbohydrates are not restricted in this population.    5.  Meet sodium needs for CF  ===================================================================  INPATIENT:    Current Nutrition Orders (inpatient):       Nutrition Orders   (From admission, onward)             Start     Ordered    01/20/19 1842  Nutrition Therapy General (Regular)  Effective now     Comments:  Double portions   Question Answer Comment   Nutrition Therapy (T): General (Regular)    Other Restriction(s): High protein/high calorie  01/20/19 1842              CF Nutrition related medications (inpatient): Nutritionally relevant medications reviewed.   Lantus 70 units nightly  HumaLOG  Multivitamin  Creon 24,000 (12 at meals, 9 at snacks) provides 2461 units lipase/kg/meal & 16109 units lipase/kg/day  protonix  Symdeko    CF Nutrition related labs (inpatient):  01-21-2019: Glu 107 Recent Labs   Lab Units 01/21/19  0752 01/20/19  2222 01/20/19  2108 01/20/19  1519   POC GLUCOSE mg/dL 604 540* 981* 191     Last 5 Recorded Weights    01/20/19 1829   Weight: (!) 117.9 kg (260 lb)   ==================================================================  CLINICAL DATA:  Past Medical History:   Diagnosis Date   ??? Anxiety    ??? Chronic pain disorder    ??? Cystic fibrosis (CMS-HCC)    ??? Depression    ??? Hypertension    ??? Nonproductive cough 04/05/2018   - s/p RU lobectomy 03-29-17  - Per review of Care Everywhere PMHx also includes: CFRD, PI, essential HTN    Anthroprometric Evaluation:  Weight changes: Weight is down from February 2020.    BMI Readings from Last 1 Encounters:   01/20/19 35.26 kg/m??     Wt Readings from Last 12 Encounters:   01/20/19 (!) 117.9 kg (260 lb)   12/01/18 (!) 113.4 kg (250 lb)   11/21/18 (!) 118.2 kg (260 lb 9.6 oz)   10/01/18 (!) 122.5 kg (270 lb)   08/14/18 (!) 120.7 kg (266 lb 3.2 oz)   06/19/18 (!) 121.6 kg (268 lb)   06/19/18 (!) 122.6 kg (270 lb 4.8 oz)   04/09/18 (!) 120 kg (264 lb 8.8 oz)   03/17/18 (!) 120.2 kg (265 lb)   12/29/17 (!) 119.2 kg (262 lb 12.6 oz)   10/23/17 (!) 123.9 kg (273 lb 3.2 oz)   10/08/17 (!) 124 kg (273 lb 4.8 oz)     Ht Readings from Last 3 Encounters:   01/20/19 182.9 cm (6')   12/01/18 182.9 cm (6')   11/18/18 182.9 cm (6')   ==================================================================  Energy Intake (outpatient):  Diet: High in calories, fat, salt. Unable to assess today.  Food allergies: Banana  Diet and CFTR modulators: Prescribed Symdeko (tezacaftor/ivacaftor).    PO Supplements: none  Appetite Stimulant: none  Enteral feeding tube: n/a  Sodium in diet: Typically Adequate from diet, however Unable to assess today.  Calcium in diet: Typically Adequate from diet, however Unable to assess today.    Fat Malabsorption (outpatient):  Enzyme brand, (meals/snacks):  Creon 36,000 (7-8 at meals, 5-6 at snacks)   - Switched from Zenpep to Creon due to report of bloating & greasy stools when on Zenpep.   Enzyme administration details: Typically correct pre-meal administration., moderate compliance, swallows capsules whole - however Unable to assess today.  Enzyme dose per MEAL (units lipase/kg/meal) [phone]  Enzyme dose per DAY (units lipase/kg/day) 13086  Stools: Unable to assess today.  Abdominal pain: Unable to assess today.  Fecal Fat Studies:    Pancreatic Elastase-1   Date Value Ref Range Status   03/19/2017 <15 (L) mcg/g Final     Comment:     Adult and Pediatric Reference Ranges for    Pancreatic Elastase-1:                  Normal:      >200 mcg/g  Moderate Pancreatic        Insufficiency:  100-200 mcg/g    Severe Pancreatic        Insufficiency:      <100 mcg/g     Elastase-1 (E-1) assay results are expressed  in mcg/g, which represent mcg E1/g feces.     It is not necessary to interrupt enzyme  substitution therapy.     Test Performed by:  [facility]  [address]  [place], [place] [address]     GI meds: Nutritionally relevant medications reviewed. Hx of MVW probiotic, omeprazole.    Vitamins/Minerals (outpatient):  CF-specific MVI, dose, compliance: MVW Complete Formulation Softgel D-5000 2 daily - Unable to assess today.  Other vitamins/minerals/herbals: Unable to assess today.  Calcium supplement: Unable to assess today.  Patient Resources: -Lupita Shutter (phone [phone] [email or web address])   - Orders CF vitamin and probiotic directly from manufacturer (MVW), cost billed directly to [employer]  Fat-soluble vitamin levels:  Lab Results   Component Value Date/Time    VITAMINA 44.4 11/19/2016 0647     Lab Results   Component Value Date/Time    VITDTOTAL 27.9 03/19/2017 0508        Lab Results   Component Value Date/Time    PT 17.6 (H) 11/18/2018 1555    PT 14.2 (H) 08/13/2018 0848    PT 12.9 (H) 08/19/2017 1340    PT 11.1 03/29/2017 0508    PT 11.5 03/28/2017 0519   August 2018: normal DesCarboxyPT (also known as PIVKA) indicates no vitamin K deficiency.      Bone Health: unknown     CF Related Diabetes: Yes, on insulin regimen.        Lab Results   Component Value Date/Time    A1C 8.5 (H) 08/12/2018 2024

## 2019-01-21 NOTE — Unmapped (Signed)
PULMONARY PROGRESS  NOTE      Patient: Christian Young([birth year])  Length of stay:  LOS: 1 day     Assessment and Plan:      Principal Problem:    Cystic fibrosis with pulmonary exacerbation (CMS-HCC)  Active Problems:    Essential hypertension    Depressive disorder    Anxiety    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)    Chronic pansinusitis    Pancreatic insufficiency due to cystic fibrosis (CMS-HCC)    Bronchiectasis (CMS-HCC)    Chronic deep vein thrombosis (DVT) of lower extremity (CMS-HCC)    Obesity (BMI 30-39.9)    Restless leg syndrome  Resolved Problems:    * No resolved hospital problems. *    CF exacerbation w/ pulmonary manifestations w/ acute exacerbation of bronchiectasis:??Presented with increased cough and sputum production, SOB, as well as low grade fevers. CXR not ordered by ED. COVID pcr is negative 01/20/2019.??His last sputum culture 11/20/2018 showed: 1+ Mucoid Pseudomonas sensitive to Zosyn, Ceftaz, Aztreonam and 1+ Smooth Pseudomonas  intermediate sensitivity to Tobra and resistant to all else.     - Sputum Culture pending  - AFB ordered for h/o positive for MAC    - IV tobramycin 220 mg q12h dosing by Pharmacy and IV Zosyn 4.5 g 6h to complete in house.    - Continue home??Symdeko - patient did not bring but wife will on Friday (uanble to earlier)    - Pulmozyme 2.5 mg BID  - Pulmicort and brovana nebs BID to replace home symbicort  - HTS 7% QID w/ RT  - HTS 7% Aerobika and chest vest QID w/ RT    ??- montelukast 10 mg qHS  - cetirizine 10 mg daily  ??  History of Mycobacterium abscessus infection:??Required RULobectomy in August 2018.??Previously??on clofazimine and azithromycin. Plan to repeat AFB smears every 3 months (starting February 2020).??  - AFB smear ordered here  ??  CF-related PI  - HPHC diet  - Creon??7??capsules w/ meals, 4 w/ snacks  - MVW 2 capsules daily  ??  CF-related DM  Last A1c??8.5% (December)  - glargine 70 units qHS  - lispro??45 units TID AC (down from Carb counting dose of 60, increase as tolerated)  - SSI??  ??  Depression, anxiety  - Fluoxetine 40 mg daily  - Lamotrigine 100 mg daily    HTN  - Lisinopril 10 mg daily  ??  History of DVT  - Rivaroxaban 20 mg daily  ??  RLS  - Pramipexole 0.25 mg daily  - Elavil QHS   - Gabapentin TID  ??  Code??status:??full code    Please page [phone] with any questions.    Pablo Ledger, MD    Subjective:      History of Present Illness:  Christian Young is a 33 y.o. male admitted for CF exacerbation    6/10: Reports doing ok today.  Is concerned to go home and finish antibiotics, feels he gets better in house and is worried to go home.    Review of Systems: A comprehensive review of systems was performed and was negative except as above in HPI  Past Medical History:   Diagnosis Date   ??? Anxiety    ??? Chronic pain disorder    ??? Cystic fibrosis (CMS-HCC)    ??? Depression    ??? Hypertension    ??? Nonproductive cough 04/05/2018     Past Surgical History:   Procedure Laterality Date   ???  PR REMOVAL OF LUNG,LOBECTOMY Right 03/29/2017    Procedure: REMOVAL OF LUNG, OTHER THAN PNEUMONECTOMY; SINGLE LOBE (LOBECTOMY);  Surgeon: Cherie Dark, MD;  Location: MAIN OR [facility];  Service: Thoracic     Medications reviewed in Epic  Allergies as of 01/20/2019 - Reviewed 01/20/2019   Allergen Reaction Noted   ??? [place] [aztreonam lysine] Anaphylaxis 12/27/2016   ??? Cefepime Itching and Nausea Only 09/06/2015   ??? Other Anaphylaxis and Other (See Comments) 09/06/2015   ??? Slo-bid 100 Anaphylaxis 06/20/2017   ??? Banana Itching and Nausea And Vomiting 07/29/2016   ??? Tobramycin Tinnitus 09/06/2015     Family History   Problem Relation Age of Onset   ??? Bipolar disorder Mother    ??? Depression Mother      Social History     Tobacco Use   ??? Smoking status: Never Smoker   ??? Smokeless tobacco: Never Used   Substance Use Topics   ??? Alcohol use: Yes     Alcohol/week: 3.0 standard drinks     Types: 3 Glasses of wine per week     Comment: 3 bottles of wine 2 days ago Objective:      Physical Exam:  Vitals:    01/20/19 1944 01/20/19 2135 01/21/19 0752 01/21/19 0835   BP: 126/78  127/63 111/74   Pulse: 102 95 99 102   Resp: 18 18 18     Temp: 36.6 ??C (97.9 ??F)  36.5 ??C (97.7 ??F)    TempSrc: Oral  Oral    SpO2: 95% 95% 95% 95%   Weight:       Height:         General: Alert, well-appearing, and in no distress.  Eyes: Anicteric sclera, conjunctiva clear.  ENT:  Mucous membranes moist and intact.  Lymph: No cervical or supraclavicular adenopathy.  Lungs: Normal excursion, no dullness to percussion. Good air movement bilaterally, without wheezes or crackles. Normal upper airway sounds without evidence of stridor.  Cardiovascular: Regular rate and rhythm, S1, S2 normal, no murmur, click, rub or gallop appreciated.  Abdomen: Soft, non-tender, not distended, bowel sounds are normal, liver is not enlarged, spleen is not enlarged  Musculoskeletal: No synovitis.  Skin: No rashes or lesions.  Neuro: No focal neurological deficits.    Malnutrition Assessment by RD:          Diagnostic Review:   All labs and images were personally reviewed.

## 2019-01-21 NOTE — Unmapped (Signed)
[facility]   History and Physical    Assessment/Plan:    Principal Problem:    Cystic fibrosis with pulmonary exacerbation (CMS-HCC)  Active Problems:    Essential hypertension    Depressive disorder    Anxiety    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)    Chronic pansinusitis    Pancreatic insufficiency due to cystic fibrosis (CMS-HCC)    Bronchiectasis (CMS-HCC)    Chronic deep vein thrombosis (DVT) of lower extremity (CMS-HCC)    Obesity (BMI 30-39.9)    Restless leg syndrome      ??  Christian Young??is a 33 year old man with cystic fibrosis [phone] and [phone] insertion) c/b bronchiectasis, pancreatic insufficiency, and diabetes, s/p RULobectomy (August 2018) for M abscessus infection, HTN, MDD, and history of DVT, who presents with??increased sputum production and low grade fevers, likely due to a CF exacerbation.  ??    CF exacerbation w/ pulmonary manifestations w/ acute exacerbation of bronchiectasis:??Presented with increased cough and sputum production, SOB, as well as low grade fevers. CXR not ordered by ED. COVID pcr is negative 01/20/2019.??His last sputum culture 11/20/2018 showed: 1+ Mucoid Pseudomonas sensitive to Zosyn, Ceftaz, Aztreonam and 1+ Smooth Pseudomonas  intermediate sensitivity to Tobra and resistant to all else. Sats are stable on RA. Temp 36.1 in ED.  - Sputum Culture pending  - CXR pending  - Will initiate pip/tazo and tobramycin to complete at home.    - IV tobramycin 220 mg q12h dosing by Pharmacy  - IV Zosyn 4.5 g 6h   - Continue home??Symdeko  - Pulmozyme 2.5 mg BID  - montelukast 10 mg qHS  - cetirizine 10 mg daily  - Symbicort 2 puff BID??or formulary equivalent??  - HTS 7% QID w/ RT  - Aerobika and chest vest QID w/ RT  - Will consider Tobra nebs as well  ??    History of Mycobacterium abscessus infection:??Required RULobectomy in August 2018.??Previously??on clofazimine and azithromycin. Plan to repeat AFB smears every 3 months (starting February 2020).??  - AFB smear and culture 4/9  ??    CF-related PI  - HPHC diet  - Creon??7??capsules w/ meals, 4 w/ snacks  - MVW 2 capsules daily  ??    CF-related DM  Last A1c??8.5% (December)  - glargine 70 units qHS  - lispro??45 units TID AC (down from Carb counting dose of 60, increase as tolerated)  - SSI  ??    Depression, anxiety  - Fluoxetine 40 mg daily  - Lamotrigine 100 mg daily  ??    HTN  - Lisinopril 10 mg daily  ??    History of DVT  - Rivaroxaban 20 mg daily  ??    RLS  - Pramipexole 0.25 mg daily  - Gabapentin QHS to replace Horizant??  ??    Code??status:??full code  ??    Contact Precautions       Floor time 75 minutes minutes, > 50% spent in counseling and coordination of care about the following issues:  plan of care, prior care, management strategies, discussions with Patient, ED physician, Pulmonary Consultant.  ___________________________________________________________________    Chief Complaint  Chief Complaint   Patient presents with   ??? Shortness of Breath       HPI:  Christian Young??is a 33 year old man with cystic fibrosis ([id number] and [phone] insertion) c/b bronchiectasis, pancreatic insufficiency, and diabetes, s/p RULobectomy (August 2018) for M abscessus infection, HTN, MDD, and history of DVT, who presents with??increased sputum production  and low grade fevers.    Pt w/ c/o of 1 week of increased sputum production, cough, low grade fevers to 99.5 and SOB x 1 week. He reports he has not had sig chills. He states his sputum has turned a caramel brown color. He reports increased fatigue during this period of time as well. He states that his SOB is rather marked and that he notices SOB with simple tasks such as toweling off after a shower or half a flight of stairs.     He reports his appetite has been good, no hypoglycemic episodes. He has not had issues or pain with his port. He does report chest pain and is requesting oxy as he reports this is his normal treatment in hospital for chest pain.     He denies missing medications, running out of medication or sick exposures.    He received IV morphine 4 mg in ED.    Allergies:  Cayston [aztreonam lysine]; Cefepime; Other; Slo-bid 100; Banana; and Tobramycin     Medications:   Prior to Admission medications    Medication Dose, Route, Frequency   albuterol (PROVENTIL HFA;VENTOLIN HFA) 90 mcg/actuation inhaler 2 puffs, Inhalation, Every 6 hours PRN   albuterol 2.5 mg/0.5 mL nebulizer solution 2.5 mg, Nebulization, Every 6 hours PRN   amitriptyline (ELAVIL) 25 MG tablet 25 mg, Oral, Nightly   blood sugar diagnostic Strp Dispense 150 blood glucose test strips, ok to sub any brand preferred by insurance/patient, use up to 5x/day, dx E 08.9   blood-glucose meter kit Dispense meter that is preferred by patient's insurance company   blood-glucose meter,continuous (DEXCOM G6 RECEIVER) Misc Use as directed   blood-glucose sensor (DEXCOM G6 SENSOR) Devi Use sq as directed every 14 days   blood-glucose transmitter (DEXCOM G6 TRANSMITTER) Devi Use as directed   budesonide-formoterol (SYMBICORT) 160-4.5 mcg/actuation inhaler 2 puffs, Inhalation, 2 times a day (standard)   cetirizine (ZYRTEC) 10 MG tablet TAKE 1 TABLET (10 MG TOTAL) BY MOUTH DAILY.   diclofenac sodium (VOLTAREN) 1 % gel 2 g, Topical, 4 times a day  Patient not taking: Reported on 11/18/2018   dornase alfa (PULMOZYME) 1 mg/mL nebulizer solution 2.5 mg, Inhalation, 2 times a day (standard)   EASY TOUCH LANCING DEVICE Misc Use as directed.   EASY TOUCH TWIST LANCETS 30 gauge Misc Use as directed.   FLUoxetine (PROZAC) 20 MG capsule 40 mg, Oral, Daily (standard)   gabapentin enacarbil (HORIZANT) 600 mg TbER 1,200 mg, Oral, Nightly   glucagon, human recombinant, (GLUCAGON) 1 mg/ml injection 1 mg, Subcutaneous, Once as needed, Follow package directions for low blood sugar.  Patient not taking: Reported on 11/18/2018   insulin ASPART (NOVOLOG FLEXPEN) 100 unit/mL (3 mL) injection pen Use up to 150 units/day, divided TID AC meals Patient taking differently: 60-80 units TID AC   insulin glargine (BASAGLAR, LANTUS) 100 unit/mL (3 mL) injection pen 70 Units, Subcutaneous, Daily (standard)   lamoTRIgine (LAMICTAL) 200 MG tablet 200 mg, Oral, Daily (standard)   lidocaine-prilocaine (EMLA) 2.5-2.5 % cream Topical, Every 12 hours   lipase-protease-amylase (CREON) 36,000-114,000- 180,000 unit CpDR TAKE 7-8 CAPSULES BY MOUTH WITH MEALS AND 5-6 CAPSULES WITH SNACKS   lisinopriL (PRINIVIL,ZESTRIL) 10 MG tablet 10 mg, Oral, Daily (standard)   melatonin 10 mg Tab 10 mg, Oral, Nightly   montelukast (SINGULAIR) 10 mg tablet 10 mg, Oral, Nightly   MVW COMPLETE FORMUL PROBIOTIC 40 billion cell -15 mg CpDR 1 capsule, Oral, Daily (standard)   MVW COMPLETE FORMULATION D5000  5,000-800 unit-mcg cap 2 capsules, Oral, Daily (standard)   nebulizers Misc Use as directed with inhaled medications   omeprazole (PRILOSEC) 20 MG capsule 20 mg, Oral, Daily (standard)   pen needle, diabetic (BD ULTRA-FINE NANO PEN NEEDLE) 32 gauge x 5/32 Ndle ok to sub any brand or size needle preferred by insurance/patient, use up to 4x/day, dx E 08.9   pramipexole (MIRAPEX) 0.125 MG tablet 0.25 mg, Oral, Daily (standard)   sodium chloride 7% 7 % Nebu 4 mL, Nebulization, 2 times a day (standard), Increase to 4 times while taking antibiotics  Patient taking differently: Inhale 4 mL by nebulization Two (2) times a day.    tezacaftor 100mg /ivacaftor 150mg  and ivacaftor 150mg  (SYMDEKO) tablets TAKE BY MOUTH AS DIRECTED ON PACKAGE LABELING   tobramycin (BETHKIS) 300 mg/4 mL Nebu 300 mg, Inhalation, Every 12 hours, For 28 days, alternating every other 28 days   traZODone (DESYREL) 100 MG tablet 200 mg, Oral, Nightly   XARELTO 20 mg tablet 20 mg, Oral, Daily (standard)   insulin detemir U-100 (LEVEMIR FLEXTOUCH U-100 INSULN) 100 unit/mL (3 mL) injection pen Use up to 30 units per day, as per MD instructions       Medical History:  Past Medical History:   Diagnosis Date   ??? Anxiety    ??? Chronic pain disorder    ??? Cystic fibrosis (CMS-HCC)    ??? Depression    ??? Hypertension    ??? Nonproductive cough 04/05/2018       Surgical History:  Past Surgical History:   Procedure Laterality Date   ??? PR REMOVAL OF LUNG,LOBECTOMY Right 03/29/2017    Procedure: REMOVAL OF LUNG, OTHER THAN PNEUMONECTOMY; SINGLE LOBE (LOBECTOMY);  Surgeon: Cherie Dark, MD;  Location: MAIN OR [facility];  Service: Thoracic       Social History:  Social History     Social History Narrative   ??? Not on file     Social History     Tobacco Use   ??? Smoking status: Never Smoker   ??? Smokeless tobacco: Never Used   Substance Use Topics   ??? Alcohol use: Yes     Alcohol/week: 3.0 standard drinks     Types: 3 Glasses of wine per week     Comment: 3 bottles of wine 2 days ago   ??? Drug use: No       Family History:  Family History   Problem Relation Age of Onset   ??? Bipolar disorder Mother    ??? Depression Mother        Review of Systems:  10 systems reviewed and are negative unless otherwise mentioned in HPI      Physical Exam:  Temp:  [36.1 ??C (97 ??F)] 36.1 ??C (97 ??F)  Heart Rate:  [114] 114  Resp:  [16] 16  BP: (143)/(89) 143/89  SpO2:  [95 %] 95 %  There is no height or weight on file to calculate BMI.    GEN: NAD, lying in bed.Marland Kitchen  EYES: EOMI, nonicteric sclerae.  ENT: no thyromegaly, no carotid bruits  CV: RRR, no murmurs, rubs or gallops appreciated  PULM: Good air movement. Crackles B bases L>R. No wheezes  ABD: soft, NT/ND, NABS, no hepatosplenomegaly.  EXT: No edema, no cyanosis or clubbing.  PSYCH: Appropriate  MSK: No spinal tenderness, No CVA tenderness, no joint effusions or deformities.  NEURO: No focal deficits.  CN II through XII are grossly intact.  Patient is alert and oriented ??3.  Test Results:  Data Review:    All lab results last 24 hours:    Recent Results (from the past 24 hour(s))   COVID-19 PCR    Collection Time: 01/20/19  3:11 PM   Result Value Ref Range    SARS-CoV-2 PCR Negative Negative   POCT Glucose    Collection Time: 01/20/19  3:19 PM   Result Value Ref Range    Glucose, POC 148 70 - 179 mg/dL   Basic Metabolic Panel    Collection Time: 01/20/19  3:21 PM   Result Value Ref Range    Sodium 139 135 - 145 mmol/L    Potassium 3.9 3.5 - 5.0 mmol/L    Chloride 101 98 - 107 mmol/L    CO2 26.0 22.0 - 30.0 mmol/L    Anion Gap 12 7 - 15 mmol/L    BUN 13 7 - 21 mg/dL    Creatinine 1.61 (L) 0.70 - 1.30 mg/dL    BUN/Creatinine Ratio 22     EGFR CKD-EPI Non-African American, Male >90 >=60 mL/min/1.58m2    EGFR CKD-EPI African American, Male >90 >=60 mL/min/1.76m2    Glucose 152 70 - 179 mg/dL    Calcium 8.9 8.5 - 09.6 mg/dL   CBC w/ Differential    Collection Time: 01/20/19  3:21 PM   Result Value Ref Range    WBC 10.0 4.5 - 11.0 10*9/L    RBC 4.81 4.50 - 5.90 10*12/L    HGB 13.2 (L) 13.5 - 17.5 g/dL    HCT 04.5 (L) 40.9 - 53.0 %    MCV 83.0 80.0 - 100.0 fL    MCH 27.4 26.0 - 34.0 pg    MCHC 33.0 31.0 - 37.0 g/dL    RDW 81.1 (H) 91.4 - 15.0 %    MPV 6.7 (L) 7.0 - 10.0 fL    Platelet 312 150 - 440 10*9/L    Neutrophils % 71.9 %    Lymphocytes % 18.3 %    Monocytes % 4.9 %    Eosinophils % 2.5 %    Basophils % 0.5 %    Absolute Neutrophils 7.2 2.0 - 7.5 10*9/L    Absolute Lymphocytes 1.8 1.5 - 5.0 10*9/L    Absolute Monocytes 0.5 0.2 - 0.8 10*9/L    Absolute Eosinophils 0.3 0.0 - 0.4 10*9/L    Absolute Basophils 0.1 0.0 - 0.1 10*9/L    Large Unstained Cells 2 0 - 4 %       Imaging: Pending

## 2019-01-22 LAB — CBC
HEMOGLOBIN: 13.7 g/dL (ref 13.5–17.5)
MEAN CORPUSCULAR HEMOGLOBIN CONC: 33.6 g/dL (ref 31.0–37.0)
MEAN CORPUSCULAR HEMOGLOBIN: 28.1 pg (ref 26.0–34.0)
MEAN CORPUSCULAR VOLUME: 83.7 fL (ref 80.0–100.0)
MEAN PLATELET VOLUME: 6.9 fL — ABNORMAL LOW (ref 7.0–10.0)
PLATELET COUNT: 289 10*9/L (ref 150–440)
RED BLOOD CELL COUNT: 4.88 10*12/L (ref 4.50–5.90)
RED CELL DISTRIBUTION WIDTH: 15.7 % — ABNORMAL HIGH (ref 12.0–15.0)
WBC ADJUSTED: 6 10*9/L (ref 4.5–11.0)

## 2019-01-22 LAB — COMPREHENSIVE METABOLIC PANEL
ALBUMIN: 4.2 g/dL (ref 3.5–5.0)
ALKALINE PHOSPHATASE: 87 U/L (ref 38–126)
ALT (SGPT): 66 U/L — ABNORMAL HIGH (ref <50)
ANION GAP: 12 mmol/L (ref 7–15)
AST (SGOT): 58 U/L — ABNORMAL HIGH (ref 19–55)
BILIRUBIN TOTAL: 0.3 mg/dL (ref 0.0–1.2)
BLOOD UREA NITROGEN: 11 mg/dL (ref 7–21)
BUN / CREAT RATIO: 15
CHLORIDE: 103 mmol/L (ref 98–107)
CO2: 24 mmol/L (ref 22.0–30.0)
CREATININE: 0.73 mg/dL (ref 0.70–1.30)
EGFR CKD-EPI NON-AA MALE: >90 mL/min/{1.73_m2} (ref >=60)
GLUCOSE RANDOM: 159 mg/dL (ref 70–179)
POTASSIUM: 4 mmol/L (ref 3.5–5.0)
PROTEIN TOTAL: 6.9 g/dL (ref 6.5–8.3)
SODIUM: 139 mmol/L (ref 135–145)

## 2019-01-22 LAB — PLATELET COUNT: Lab: 289

## 2019-01-22 LAB — CO2: Carbon dioxide:SCnc:Pt:Ser/Plas:Qn:: 24

## 2019-01-22 LAB — TOBRAMYCIN PEAK: Tobramycin^peak:MCnc:Pt:Ser/Plas:Qn:: 7.5

## 2019-01-22 LAB — MAGNESIUM
MAGNESIUM: 1.7 mg/dL (ref 1.6–2.2)
Magnesium:MCnc:Pt:Ser/Plas:Qn:: 1.7

## 2019-01-22 LAB — TOBRAMYCIN RANDOM: Tobramycin:MCnc:Pt:Ser/Plas:Qn:: 1.1

## 2019-01-22 NOTE — Unmapped (Signed)
PULMONARY PROGRESS  NOTE      Patient: Christian Young(November 04, 1985)  Length of stay:  LOS: 2 days     Assessment and Plan:      Principal Problem:    Cystic fibrosis with pulmonary exacerbation (CMS-HCC)  Active Problems:    Essential hypertension    Depressive disorder    Anxiety    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)    Chronic pansinusitis    Pancreatic insufficiency due to cystic fibrosis (CMS-HCC)    Bronchiectasis (CMS-HCC)    Chronic deep vein thrombosis (DVT) of lower extremity (CMS-HCC)    Obesity (BMI 30-39.9)    Restless leg syndrome  Resolved Problems:    * No resolved hospital problems. *    CF exacerbation w/ pulmonary manifestations w/ acute exacerbation of bronchiectasis:??Presented with increased cough and sputum production, SOB, as well as low grade fevers. CXR not ordered by ED. COVID pcr is negative 01/20/2019.??His last sputum culture 11/20/2018 showed: 1+ Mucoid Pseudomonas sensitive to Zosyn, Ceftaz, Aztreonam and 1+ Smooth Pseudomonas  intermediate sensitivity to Tobra and resistant to all else.     - Sputum Culture pending  - AFB ordered for h/o positive for MAC    - IV tobramycin 220 mg q12h dosing by Pharmacy and IV Zosyn 4.5 g 6h to complete in house.    - Continue home??Symdeko - patient did not bring but wife will on Friday (uanble to earlier)    - Pulmozyme 2.5 mg BID  - Pulmicort and brovana nebs BID to replace home symbicort  - HTS 7% QID w/ RT  - HTS 7% Aerobika and chest vest QID w/ RT    ??- montelukast 10 mg qHS  - cetirizine 10 mg daily  ??  History of Mycobacterium abscessus infection:??Required RULobectomy in August 2018.??Previously??on clofazimine and azithromycin. Plan to repeat AFB smears every 3 months (starting February 2020).??  - AFB smear ordered here  ??  CF-related PI  - HPHC diet  - Creon??7??capsules w/ meals, 4 w/ snacks  - MVW 2 capsules daily  ??  CF-related DM  Last A1c??8.5% (December)  - glargine 70 units qHS  - lispro??45 units TID AC (down from Carb counting dose of 60, increase as tolerated)  - SSI??  ??  Depression, anxiety  - Fluoxetine 40 mg daily  - Lamotrigine 100 mg daily    HTN  - Lisinopril 10 mg daily  ??  History of DVT  - Rivaroxaban 20 mg daily  ??  RLS  - Pramipexole 0.25 mg daily  - Elavil QHS   - Gabapentin TID  ??  Code??status:??full code    Please page [phone] with any questions.    Christian Ledger, MD    Subjective:      History of Present Illness:  Christian Young is a 33 y.o. male admitted for CF exacerbation    6/11: Still having joint aches and pains, no other complaints.     Review of Systems: A comprehensive review of systems was performed and was negative except as above in HPI  Past Medical History:   Diagnosis Date   ??? Anxiety    ??? Chronic pain disorder    ??? Cystic fibrosis (CMS-HCC)    ??? Depression    ??? Hypertension    ??? Nonproductive cough 04/05/2018     Past Surgical History:   Procedure Laterality Date   ??? PR REMOVAL OF LUNG,LOBECTOMY Right 03/29/2017    Procedure: REMOVAL OF LUNG, OTHER THAN  PNEUMONECTOMY; SINGLE LOBE (LOBECTOMY);  Surgeon: Cherie Dark, MD;  Location: MAIN OR [facility];  Service: Thoracic     Medications reviewed in Epic  Allergies as of 01/20/2019 - Reviewed 01/20/2019   Allergen Reaction Noted   ??? Cayston [aztreonam lysine] Anaphylaxis 12/27/2016   ??? Cefepime Itching and Nausea Only 09/06/2015   ??? Other Anaphylaxis and Other (See Comments) 09/06/2015   ??? Slo-bid 100 Anaphylaxis 06/20/2017   ??? Banana Itching and Nausea And Vomiting 07/29/2016   ??? Tobramycin Tinnitus 09/06/2015     Family History   Problem Relation Age of Onset   ??? Bipolar disorder Mother    ??? Depression Mother      Social History     Tobacco Use   ??? Smoking status: Never Smoker   ??? Smokeless tobacco: Never Used   Substance Use Topics   ??? Alcohol use: Yes     Alcohol/week: 3.0 standard drinks     Types: 3 Glasses of wine per week     Comment: 3 bottles of wine 2 days ago        Objective:      Physical Exam:  Vitals:    01/21/19 1929 01/21/19 2056 01/22/19 0818 01/22/19 0842   BP: 122/74  120/70    Pulse: 112 105 97 96   Resp: 18 19 18 18    Temp: 36.2 ??C (97.2 ??F)  37.1 ??C (98.8 ??F)    TempSrc:   Oral    SpO2: 94% 96% 96% 96%   Weight:       Height:         General: Alert, well-appearing, and in no distress.  Eyes: Anicteric sclera, conjunctiva clear.  ENT:  Mucous membranes moist and intact.  Lymph: No cervical or supraclavicular adenopathy.  Lungs: Normal excursion, no dullness to percussion. Good air movement bilaterally, without wheezes or crackles. Normal upper airway sounds without evidence of stridor.  Cardiovascular: Regular rate and rhythm, S1, S2 normal, no murmur, click, rub or gallop appreciated.  Abdomen: Soft, non-tender, not distended, bowel sounds are normal, liver is not enlarged, spleen is not enlarged  Musculoskeletal: No synovitis.  Skin: No rashes or lesions.  Neuro: No focal neurological deficits.    Malnutrition Assessment by RD:          Diagnostic Review:   All labs and images were personally reviewed.

## 2019-01-22 NOTE — Unmapped (Signed)
The patient received his breathing treatments as ordered.  The patient uses his own [facility] chest vest.  Patient is on room air.  Will continue to monitor.  Stephani Police RRT

## 2019-01-22 NOTE — Unmapped (Signed)
POC discussed with patient.  Pain controlled with prn medications. No falls noted during my shift thus far and I will continue to monitor patient.  IV antibiotics given per orders.     Problem: Adult Inpatient Plan of Care  Goal: Plan of Care Review  Outcome: Progressing  Goal: Patient-Specific Goal (Individualization)  Outcome: Progressing  Goal: Absence of Hospital-Acquired Illness or Injury  Outcome: Progressing  Goal: Optimal Comfort and Wellbeing  Outcome: Progressing  Goal: Readiness for Transition of Care  Outcome: Progressing  Goal: Rounds/Family Conference  Outcome: Progressing     Problem: Infection  Goal: Infection Symptom Resolution  Outcome: Progressing     Problem: Fall Injury Risk  Goal: Absence of Fall and Fall-Related Injury  Outcome: Progressing     Problem: Diabetes Comorbidity  Goal: Blood Glucose Level Within Desired Range  Outcome: Progressing     Problem: Wound  Goal: Optimal Wound Healing  Outcome: Progressing

## 2019-01-22 NOTE — Unmapped (Signed)
Cystic Fibrosis Nutrition Assessment  *Patient on contact precautions related to CF, nutrition assessment conducted by telephone to preserve PPE considering COVID-19 pandemic*    Inpatient: Nutrition Risk Screening this admission for CF and related follow up  Primary Pulmonologist: Dr. [name]  ===================================================================  Christian Young is a 33 y.o. male currently admitted with CF exacerbation for IV antibiotics.  - Home enzyme Creon 36,000 not on inpatient drug formulary, okay to sub with Creon 24,000 (at adjusted dose) during admission.  - RD spoke with [facility] via phone today. He reports fair to good appetite during exacerbation. Denies constipation, s/s of malabsorption.   ===================================================================  INTERVENTION  1. During admission continue Creon 24,000 (12 at meals, 9 at snacks).  - Can resume home Creon 36,000 after discharge.    2. Recommend adjust vitamin regimen:  - STOP adult multivitamin  - START CF vitamin and supplemental vitamin D:  MVW Complete Formulation gel cap 2 daily and 6000 International units vitamin D daily daily. This regimen will provide a total of 9000 International units vitamin D daily.  - When discharged home to resume usual Complete Formulation D5000 softgels, 2 daily. Which provides total of 10,000 International units vitamin D daily.    3. Weigh patient twice weekly this admit.    4. Recommend check PT. If elevated start 5mg  phytonadione daily x 5 days, then recheck PT. If WNL start 5mg  phytonadione twice weekly while on IV antibiotics.    5. Continue remainder of nutrition regimen:  - acid reducer  - regular diet with double portions  - insulin as appropriate    Inpatient:   Will follow up with patient per protocol: 1-2 times per week (and more frequent as indicated)===================================================================  ASSESSMENT:  Cystic Fibrosis Nutrition Category = Outstanding Current diet is appropriate for CF. Patient continues to work towards goals for weight management.   Enzyme dose is within established guidelines. Vitamin prescription is not appropriate to reach/maintain optimal fat soluble vitamin levels. Patient would benefit from change in vitamin regimen. Sodium needs for CF met with PO and/or supplement. Bowel regimen is appropriate. Acid reducer appropriate for GERD and enzyme activation.    Malnutrition Assessment using AND/ASPEN Clinical Characteristics:  Nutrition focused physical exam not completed as visit today is via phone in an effort to minimize contact between patient and provider due to COVID-19 pandemic.    Goals:  1. Meet estimated daily needs: 2800-3276 kcals (24-28 kcals/kg/day); 140-187 gm pro (DRI x 1.5 -2); 3440 mL free water (Holliday Segar Method)  2. Reach/maintain established goals for CF:                Adult - BMI 22kg/m2 for CF females and 23kg/m2 for CF males  3. Normal fat-soluble vitamin levels: Vitamin A, Vitamin E and PT per lab range; Vitamin D 25OH total >30  4. Maintain glucose control. Carbohydrate content of diet should comprise 40-50% of total calorie needs, but carbohydrates are not restricted in this population.    5.  Meet sodium needs for CF  ===================================================================  INPATIENT:    Current Nutrition Orders (inpatient):       Nutrition Orders   (From admission, onward)             Start     Ordered    01/20/19 1842  Nutrition Therapy General (Regular)  Effective now     Comments:  Double portions   Question Answer Comment   Nutrition Therapy (T): General (Regular)    Other Restriction(s):  High protein/high calorie        01/20/19 1842              CF Nutrition related medications (inpatient): Nutritionally relevant medications reviewed.   Lantus 70 units nightly  HumaLOG  Multivitamin  Creon 24,000 (12 at meals, 9 at snacks) provides 2461 units lipase/kg/meal & 45409 units lipase/kg/day protonix  Symdeko    CF Nutrition related labs (inpatient):  01-22-2019: Glu 159  01-21-2019: Glu 107   Recent Labs   Lab Units 01/22/19  1146 01/22/19  0814 01/21/19  1928 01/21/19  1640   POC GLUCOSE mg/dL 811* 914 782* 956*     Last 5 Recorded Weights    01/20/19 1829   Weight: (!) 117.9 kg (260 lb)   ==================================================================  CLINICAL DATA:  Past Medical History:   Diagnosis Date   ??? Anxiety    ??? Chronic pain disorder    ??? Cystic fibrosis (CMS-HCC)    ??? Depression    ??? Hypertension    ??? Nonproductive cough 04/05/2018   - s/p RU lobectomy 03-29-17  - Per review of Care Everywhere PMHx also includes: CFRD, PI, essential HTN    Anthroprometric Evaluation:  Weight changes: Weight is down from February 2020.    BMI Readings from Last 1 Encounters:   01/20/19 35.26 kg/m??     Wt Readings from Last 12 Encounters:   01/20/19 (!) 117.9 kg (260 lb)   12/01/18 (!) 113.4 kg (250 lb)   11/21/18 (!) 118.2 kg (260 lb 9.6 oz)   10/01/18 (!) 122.5 kg (270 lb)   08/14/18 (!) 120.7 kg (266 lb 3.2 oz)   06/19/18 (!) 121.6 kg (268 lb)   06/19/18 (!) 122.6 kg (270 lb 4.8 oz)   04/09/18 (!) 120 kg (264 lb 8.8 oz)   03/17/18 (!) 120.2 kg (265 lb)   12/29/17 (!) 119.2 kg (262 lb 12.6 oz)   10/23/17 (!) 123.9 kg (273 lb 3.2 oz)   10/08/17 (!) 124 kg (273 lb 4.8 oz)     Ht Readings from Last 3 Encounters:   01/20/19 182.9 cm (6')   12/01/18 182.9 cm (6')   11/18/18 182.9 cm (6')   ==================================================================  Energy Intake (outpatient):  Diet: High in calories, fat, salt. Reports appetite/intake are decreased from baseline with current exacerbation. States he has been more aware of balancing meals, but has not made any big diet changes as discussed in February 2020 visit.  Food allergies: Banana  Diet and CFTR modulators: Prescribed Symdeko (tezacaftor/ivacaftor).    PO Supplements: none  Appetite Stimulant: none  Enteral feeding tube: n/a  Sodium in diet: Adequate from diet  Calcium in diet: Adequate from diet     Other Labs: April 2020 - annual labs completed at [facility]. Total cholesterol and LDL cholesterol elevated. Cholesterol-lowering medical nutrition therapy information sent via MyChart.    Fat Malabsorption (outpatient):  Enzyme brand, (meals/snacks):  Creon 36,000 (7-8 at meals, 5-6 at snacks)   - Switched from Zenpep to Creon due to report of bloating & greasy stools when on Zenpep.   Enzyme administration details: correct pre-meal administration., swallows capsules whole   Enzyme dose per MEAL (units lipase/kg/meal) [phone]  Enzyme dose per DAY (units lipase/kg/day) 84696  Stools: 1-2 daily, not greasy  Abdominal pain: None reported  Fecal Fat Studies:    Pancreatic Elastase-1   Date Value Ref Range Status   03/19/2017 <15 (L) mcg/g Final     Comment:  Adult and Pediatric Reference Ranges for    Pancreatic Elastase-1:                  Normal:      >200 mcg/g  Moderate Pancreatic        Insufficiency:   100-200 mcg/g    Severe Pancreatic        Insufficiency:      <100 mcg/g     Elastase-1 (E-1) assay results are expressed  in mcg/g, which represent mcg E1/g feces.     It is not necessary to interrupt enzyme  substitution therapy.     Test Performed by:  [facility]  [address]  [place], [place] [address]     GI meds: Nutritionally relevant medications reviewed. Hx of MVW probiotic, omeprazole.    Vitamins/Minerals (outpatient):  CF-specific MVI, dose, compliance: MVW Complete Formulation Softgel D-5000 2 daily  Other vitamins/minerals/herbals: none  Calcium supplement: none  Patient Resources: -[employer] (phone [phone] [email or web address])   - Orders CF vitamin and probiotic directly from manufacturer (MVW), cost billed directly to [employer]  Fat-soluble vitamin levels:  Annual labs completed April 2020 at [facility]. Vitamin A, D and E all within normal limits.    Lab Results Component Value Date/Time    VITAMINA 44.4 11/19/2016 0647     Lab Results   Component Value Date/Time    VITDTOTAL 27.9 03/19/2017 0508        Lab Results   Component Value Date/Time    PT 17.6 (H) 11/18/2018 1555    PT 14.2 (H) 08/13/2018 0848    PT 12.9 (H) 08/19/2017 1340    PT 11.1 03/29/2017 0508    PT 11.5 03/28/2017 0519   August 2018: normal DesCarboxyPT (also known as PIVKA) indicates no vitamin K deficiency.      Bone Health: Last DEXA normal. (July 2018)     CF Related Diabetes: Yes, on insulin regimen.        Lab Results   Component Value Date/Time    A1C 8.5 (H) 08/12/2018 2024

## 2019-01-22 NOTE — Unmapped (Signed)
Patient does his own vest therapy with home equipment.

## 2019-01-23 NOTE — Unmapped (Signed)
PULMONARY PROGRESS  NOTE      Patient: Christian Young(1986/05/19)  Length of stay:  LOS: 3 days     Assessment and Plan:      Principal Problem:    Cystic fibrosis with pulmonary exacerbation (CMS-HCC)  Active Problems:    Essential hypertension    Depressive disorder    Anxiety    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)    Chronic pansinusitis    Pancreatic insufficiency due to cystic fibrosis (CMS-HCC)    Bronchiectasis (CMS-HCC)    Chronic deep vein thrombosis (DVT) of lower extremity (CMS-HCC)    Obesity (BMI 30-39.9)    Restless leg syndrome  Resolved Problems:    * No resolved hospital problems. *    CF exacerbation w/ pulmonary manifestations w/ acute exacerbation of bronchiectasis:??Presented with increased cough and sputum production, SOB, as well as low grade fevers. CXR not ordered by ED. COVID pcr is negative 01/20/2019.??His last sputum culture 11/20/2018 showed: 1+ Mucoid Pseudomonas sensitive to Zosyn, Ceftaz, Aztreonam and 1+ Smooth Pseudomonas  intermediate sensitivity to Tobra and resistant to all else.     - Sputum Culture pending  - AFB ordered for h/o positive for MAC    - IV tobramycin 220 mg q12h dosing by Pharmacy and IV Zosyn 4.5 g 6h to complete in house.    - Continue home??Symdeko - patient did not bring but wife will bring today     - Pulmozyme 2.5 mg BID  - Pulmicort and brovana nebs BID to replace home symbicort  - HTS 7% QID w/ RT  - HTS 7% Aerobika and chest vest QID w/ RT    ??- montelukast 10 mg qHS  - cetirizine 10 mg daily  ??  History of Mycobacterium abscessus infection:??Required RULobectomy in August 2018.??Previously??on clofazimine and azithromycin. Plan to repeat AFB smears every 3 months (starting February 2020).??  - AFB smear ordered here  ??  CF-related PI  - HPHC diet  - Creon??7??capsules w/ meals, 4 w/ snacks  - MVW 2 capsules daily  ??  CF-related DM  Last A1c??8.5% (December)  - glargine 70 units qHS  - lispro??45 units TID AC (down from Carb counting dose of 60, increase as tolerated)  - SSI??  ??  Depression, anxiety  - Fluoxetine 40 mg daily  - Lamotrigine 100 mg daily    HTN  - Lisinopril 10 mg daily  ??  History of DVT  - Rivaroxaban 20 mg daily  ??  RLS  - Pramipexole 0.25 mg daily  - Elavil QHS   - Gabapentin TID  ??  Code??status:??full code    Please page [phone] with any questions.    Pablo Ledger, MD    Subjective:      History of Present Illness:  Christian Young is a 33 y.o. male admitted for CF exacerbation    6/12: Still feeling ok, ok doing 4 x a day airway clearance.    Review of Systems: A comprehensive review of systems was performed and was negative except as above in HPI  Past Medical History:   Diagnosis Date   ??? Anxiety    ??? Chronic pain disorder    ??? Cystic fibrosis (CMS-HCC)    ??? Depression    ??? Hypertension    ??? Nonproductive cough 04/05/2018     Past Surgical History:   Procedure Laterality Date   ??? PR REMOVAL OF LUNG,LOBECTOMY Right 03/29/2017    Procedure: REMOVAL OF LUNG, OTHER THAN PNEUMONECTOMY;  SINGLE LOBE (LOBECTOMY);  Surgeon: Cherie Dark, MD;  Location: MAIN OR [facility];  Service: Thoracic     Medications reviewed in Epic  Allergies as of 01/20/2019 - Reviewed 01/20/2019   Allergen Reaction Noted   ??? [place] [aztreonam lysine] Anaphylaxis 12/27/2016   ??? Cefepime Itching and Nausea Only 09/06/2015   ??? Other Anaphylaxis and Other (See Comments) 09/06/2015   ??? Slo-bid 100 Anaphylaxis 06/20/2017   ??? Banana Itching and Nausea And Vomiting 07/29/2016   ??? Tobramycin Tinnitus 09/06/2015     Family History   Problem Relation Age of Onset   ??? Bipolar disorder Mother    ??? Depression Mother      Social History     Tobacco Use   ??? Smoking status: Never Smoker   ??? Smokeless tobacco: Never Used   Substance Use Topics   ??? Alcohol use: Yes     Alcohol/week: 3.0 standard drinks     Types: 3 Glasses of wine per week     Comment: 3 bottles of wine 2 days ago        Objective:      Physical Exam:  Vitals:    01/22/19 0842 01/22/19 1945 01/23/19 0832 01/23/19 1100   BP:  128/80  136/73   Pulse: 96 105 87 105   Resp: 18 22 20 18    Temp:  36.2 ??C (97.2 ??F)  36.9 ??C (98.4 ??F)   TempSrc:    Oral   SpO2: 96% 95% 99% 95%   Weight:       Height:         General: Alert, well-appearing, and in no distress.  Eyes: Anicteric sclera, conjunctiva clear.  ENT:  Mucous membranes moist and intact.  Lymph: No cervical or supraclavicular adenopathy.  Lungs: Normal excursion, no dullness to percussion. Good air movement bilaterally, without wheezes or crackles. Normal upper airway sounds without evidence of stridor.  Cardiovascular: Regular rate and rhythm, S1, S2 normal, no murmur, click, rub or gallop appreciated.  Abdomen: Soft, non-tender, not distended, bowel sounds are normal, liver is not enlarged, spleen is not enlarged  Musculoskeletal: No synovitis.  Skin: No rashes or lesions.  Neuro: No focal neurological deficits.    Malnutrition Assessment by RD:          Diagnostic Review:   All labs and images were personally reviewed.

## 2019-01-23 NOTE — Unmapped (Signed)
Patient uses his own Monarch chest vest and self-administers the therapy.

## 2019-01-23 NOTE — Unmapped (Signed)
Spoke with Christian Young who let me know that he has been without his Symdeko since Tuesday.  He is wanting our opinion re waiting another couple of days for medication to arrive at [facility] by mail.  I urged him to restart his medication as soon as possible.  He planned to call his wife to see if anyone in the family might be able to bring it to the hospital.

## 2019-01-23 NOTE — Unmapped (Signed)
Aminoglycoside Therapeutic Monitoring Pharmacy Note    Christian Young is a 33 y.o. male continuing tobramycin. Date of therapy initiation: 01/20/19    Indication: CF exacerbation    Prior Dosing Information: Current regimen 240 mg IV q12h infused over 30 minutes    Goals:  Therapeutic Drug Levels  ?? Trough level: tobramycin <1 mg/L  ?? Peak level: tobramycin 10-14 mg/L    Additional Clinical Monitoring/Outcomes  Renal function, volume status (intake and output)    Results:   ?? Random level: 1.1 mg/L, drawn 1721 (Extrapolated trough: 0.4 mg/L)   ?? Peak level: 7.5 mg/L, drawn 1030 (Extrapolated Peak: 9.7 mg/L)    Wt Readings from Last 1 Encounters:   01/20/19 (!) 117.9 kg (260 lb)     Lab Results   Component Value Date    CREATININE 0.73 01/22/2019       Pharmacokinetic Considerations and Significant Drug Interactions:  ? Adult (calculated on 01/22/19): Vd = 23.9 L, ke = 0.280 hr-1  ? Concurrent nephrotoxic meds: Zosyn    Assessment/Plan:  Recommendation(s)  ? Continue current regimen of 240 mg IV q12h infused over 30 minutes  ? Estimated peak and trough on recommended regimen: peak = 9.7 mg/L, trough = 0.4 mg/L    Follow-up  ? Level due: in 3-5 days  ? A pharmacist will continue to monitor and order levels as appropriate    Please page service pharmacist with questions/clarifications.    Colin Mulders, PharmD

## 2019-01-23 NOTE — Unmapped (Signed)
Pt alert and oriented, VSS. IV abx administered. Plan of care discussed with patient. Isolation precautions maintained. Bed lowered and call light in reach. Will continue to monitor.     Problem: Adult Inpatient Plan of Care  Goal: Absence of Hospital-Acquired Illness or Injury  Outcome: Ongoing - Unchanged  Intervention: Identify and Manage Fall Risk  Flowsheets (Taken 01/23/2019 0258)  Safety Interventions:  ??? low bed  ??? isolation precautions  ??? fall reduction program maintained  ??? room near unit station  Intervention: Prevent Skin Injury  Flowsheets (Taken 01/23/2019 0258)  Pressure Reduction Techniques: frequent weight shift encouraged  Intervention: Prevent VTE (venous thromboembolism)  Flowsheets (Taken 01/23/2019 0258)  VTE Prevention/Management:  ??? anticoagulation therapy  ??? ambulation promoted  ??? bleeding risk factors identified  Intervention: Prevent Infection  Flowsheets (Taken 01/23/2019 0258)  Infection Prevention:  ??? rest/sleep promoted  ??? visitors restricted/screened  ??? personal protective equipment utilized  ??? single patient room provided  Goal: Optimal Comfort and Wellbeing  Outcome: Ongoing - Unchanged  Intervention: Monitor Pain and Promote Comfort  Flowsheets (Taken 01/23/2019 0258)  Pain Management Interventions:  ??? care clustered  ??? pain management plan reviewed with patient/caregiver  ??? quiet environment facilitated  Intervention: Provide Person-Centered Care  Flowsheets (Taken 01/23/2019 0258)  Trust Relationship/Rapport:  ??? care explained  ??? choices provided  ??? emotional support provided  ??? questions encouraged  ??? questions answered  ??? empathic listening provided  ??? reassurance provided  ??? thoughts/feelings acknowledged     Problem: Infection  Goal: Infection Symptom Resolution  Outcome: Ongoing - Unchanged  Intervention: Prevent or Manage Infection  Flowsheets (Taken 01/23/2019 0258)  Isolation Precautions: contact precautions maintained     Problem: Fall Injury Risk  Goal: Absence of Fall and Fall-Related Injury  Outcome: Ongoing - Unchanged  Intervention: Identify and Manage Contributors to Fall Injury Risk  Flowsheets (Taken 01/23/2019 0258)  Medication Review/Management: medications reviewed  Self-Care Promotion: independence encouraged  Intervention: Promote Injury-Free Environment  Flowsheets (Taken 01/23/2019 0258)  Safety Interventions:  ??? low bed  ??? isolation precautions  ??? fall reduction program maintained  ??? room near unit station  Environmental Safety Modification:  ??? clutter free environment maintained  ??? lighting adjusted  ??? room organization consistent  ??? room near unit station     Problem: Diabetes Comorbidity  Goal: Blood Glucose Level Within Desired Range  Outcome: Ongoing - Unchanged  Intervention: Maintain Glycemic Control  Flowsheets (Taken 01/23/2019 0258)  Glycemic Management: blood glucose monitoring

## 2019-01-23 NOTE — Unmapped (Signed)
CF Patient on room air.  MD wants chest vest done QID.  Patient agreed.  Patient self administers his Monarch chest vest. All treatments given.  Due to his diminished breath sounds I gave the PRN albuterol this morning.  Will continue to monitor.  Stephani Police RRT

## 2019-01-23 NOTE — Unmapped (Signed)
Patient self administers his Monarch chest vest.

## 2019-01-23 NOTE — Unmapped (Signed)
[facility]  Adult Cystic Fibrosis Clinic??  ??  Call made to [facility] to introduce self, check in and offer support as he is currently undergoing inpatient antibiotic treatment. Message left for him on VM with contact information. Will await return call.   Nonah Mattes, LCSW

## 2019-01-23 NOTE — Unmapped (Signed)
Pt continues on IV antibiotics, labs drawn. Isolation precautions maintained. RT in to do neb treatments. No acute concern this shift. Call bell within reach. Continue to monitor.   Problem: Adult Inpatient Plan of Care  Goal: Plan of Care Review  Outcome: Progressing  Goal: Patient-Specific Goal (Individualization)  Outcome: Progressing  Flowsheets (Taken 01/22/2019 1738)  Patient-Specific Goals (Include Timeframe): none expressed  Individualized Care Needs: IV antibiotics, contact isolation, nebs, monitor labs  Anxieties, Fears or Concerns: none expressed  Goal: Absence of Hospital-Acquired Illness or Injury  Outcome: Progressing  Goal: Optimal Comfort and Wellbeing  Outcome: Progressing  Goal: Readiness for Transition of Care  Outcome: Progressing  Goal: Rounds/Family Conference  Outcome: Progressing     Problem: Infection  Goal: Infection Symptom Resolution  Outcome: Progressing     Problem: Fall Injury Risk  Goal: Absence of Fall and Fall-Related Injury  Outcome: Progressing     Problem: Diabetes Comorbidity  Goal: Blood Glucose Level Within Desired Range  Outcome: Progressing     Problem: Wound  Goal: Optimal Wound Healing  Outcome: Progressing

## 2019-01-24 NOTE — Unmapped (Signed)
Pt continues on isolation precautions. IV antibiotics continued. RT seeing pt and doing nebs. Pt's wife brought in home med, Lambert, today, taken to pharmacy for verification for inpatient use of home med. No acute concerns. Call bell within reach. Continue to monitor.   Problem: Adult Inpatient Plan of Care  Goal: Plan of Care Review  Outcome: Progressing  Goal: Patient-Specific Goal (Individualization)  Outcome: Progressing  Flowsheets (Taken 01/23/2019 1904)  Patient-Specific Goals (Include Timeframe): none expressed  Individualized Care Needs: IV antibiotics, isolation precautions  Anxieties, Fears or Concerns: none expressed  Goal: Absence of Hospital-Acquired Illness or Injury  Outcome: Progressing  Goal: Optimal Comfort and Wellbeing  Outcome: Progressing  Goal: Readiness for Transition of Care  Outcome: Progressing  Goal: Rounds/Family Conference  Outcome: Progressing     Problem: Infection  Goal: Infection Symptom Resolution  Outcome: Progressing     Problem: Fall Injury Risk  Goal: Absence of Fall and Fall-Related Injury  Outcome: Progressing     Problem: Diabetes Comorbidity  Goal: Blood Glucose Level Within Desired Range  Outcome: Progressing     Problem: Wound  Goal: Optimal Wound Healing  Outcome: Progressing

## 2019-01-24 NOTE — Unmapped (Signed)
PULMONARY PROGRESS  NOTE      Patient: Christian Young(February 17, 1986)  Length of stay:  LOS: 4 days     Assessment and Plan:      Principal Problem:    Cystic fibrosis with pulmonary exacerbation (CMS-HCC)  Active Problems:    Essential hypertension    Depressive disorder    Anxiety    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)    Chronic pansinusitis    Pancreatic insufficiency due to cystic fibrosis (CMS-HCC)    Bronchiectasis (CMS-HCC)    Chronic deep vein thrombosis (DVT) of lower extremity (CMS-HCC)    Obesity (BMI 30-39.9)    Restless leg syndrome  Resolved Problems:    * No resolved hospital problems. *    CF exacerbation w/ pulmonary manifestations w/ acute exacerbation of bronchiectasis:??Presented with increased cough and sputum production, SOB, as well as low grade fevers. CXR not ordered by ED. COVID pcr is negative 01/20/2019.??His last sputum culture 11/20/2018 showed: 1+ Mucoid Pseudomonas sensitive to Zosyn, Ceftaz, Aztreonam and 1+ Smooth Pseudomonas  intermediate sensitivity to Tobra and resistant to all else.     - reordered sputum bacterial and AFB cultures as they were previously cancelled    - IV tobramycin 220 mg q12h dosing by Pharmacy and IV Zosyn 4.5 g 6h to complete in house.    - Continue home??Symdeko - pt has home supply    - Pulmozyme 2.5 mg BID  - Pulmicort and brovana nebs BID to replace home symbicort  - HTS 7% QID w/ RT  - HTS 7% Aerobika and chest vest QID w/ RT    ??- montelukast 10 mg qHS  - cetirizine 10 mg daily  ??  History of Mycobacterium abscessus infection:??Required RULobectomy in August 2018.??Previously??on clofazimine and azithromycin. Plan to repeat AFB smears every 3 months (starting February 2020).??  - AFB smear reordered  ??  CF-related PI  - HPHC diet  - Creon??7??capsules w/ meals, 4 w/ snacks  - MVW 2 capsules daily  ??  CF-related DM  Last A1c??8.5% (December)  - glargine 70 units qHS  - lispro??45 units TID AC (down from Carb counting dose of 60, increase as tolerated)  - SSI??  ??  Depression, anxiety  - Fluoxetine 40 mg daily  - Lamotrigine 100 mg daily    HTN  - Lisinopril 10 mg daily  ??  History of DVT  - Rivaroxaban 20 mg daily  ??  RLS  - Pramipexole 0.25 mg daily  - Elavil QHS   - Gabapentin TID  ??  Code??status:??full code    Please page [phone] with any questions.    Verne Grain, MD    Subjective:      History of Present Illness:  Christian Young is a 33 y.o. male admitted for CF exacerbation    No complaints overnight, feels as though he is about to turn the corner. Tolerating antibiotics and airway clearance without difficulty.     Review of Systems: A comprehensive review of systems was performed and was negative except as above in HPI  Past Medical History:   Diagnosis Date   ??? Anxiety    ??? Chronic pain disorder    ??? Cystic fibrosis (CMS-HCC)    ??? Depression    ??? Hypertension    ??? Nonproductive cough 04/05/2018     Past Surgical History:   Procedure Laterality Date   ??? PR REMOVAL OF LUNG,LOBECTOMY Right 03/29/2017    Procedure: REMOVAL OF LUNG, OTHER THAN  PNEUMONECTOMY; SINGLE LOBE (LOBECTOMY);  Surgeon: Cherie Dark, MD;  Location: MA[facility];  Service: Thoracic     Medications reviewed in Epic  Allergies as of 01/20/2019 - Reviewed 01/20/2019   Allergen Reaction Noted   ??? [place] [aztreonam lysine] Anaphylaxis 12/27/2016   ??? Cefepime Itching and Nausea Only 09/06/2015   ??? Other Anaphylaxis and Other (See Comments) 09/06/2015   ??? Slo-bid 100 Anaphylaxis 06/20/2017   ??? Banana Itching and Nausea And Vomiting 07/29/2016   ??? Tobramycin Tinnitus 09/06/2015     Family History   Problem Relation Age of Onset   ??? Bipolar disorder Mother    ??? Depression Mother      Social History     Tobacco Use   ??? Smoking status: Never Smoker   ??? Smokeless tobacco: Never Used   Substance Use Topics   ??? Alcohol use: Yes     Alcohol/week: 3.0 standard drinks     Types: 3 Glasses of wine per week     Comment: 3 bottles of wine 2 days ago        Objective:      Physical Exam:  Vitals:    01/23/19 1911 01/23/19 2118 01/24/19 0928 01/24/19 0950   BP: 139/76   123/80   Pulse: 102 98 94 87   Resp: 15 18 18 18    Temp: 36.6 ??C (97.9 ??F)   36.1 ??C (97 ??F)   TempSrc: Oral   Oral   SpO2: 97% 96% 97% 97%   Weight:       Height:         General: Alert, well-appearing, and in no distress.  Eyes: Anicteric sclera, conjunctiva clear.  ENT:  Mucous membranes moist and intact.  Lymph: No cervical or supraclavicular adenopathy.  Lungs: Normal excursion, no dullness to percussion. Good air movement bilaterally, without wheezes or crackles. Normal upper airway sounds without evidence of stridor.  Cardiovascular: Regular rate and rhythm, S1, S2 normal, no murmur, click, rub or gallop appreciated.  Abdomen: Soft, non-tender, not distended, bowel sounds are normal, liver is not enlarged, spleen is not enlarged  Musculoskeletal: No synovitis.  Skin: No rashes or lesions.  Neuro: No focal neurological deficits.    Malnutrition Assessment by RD:          Diagnostic Review:   All labs and images were personally reviewed.

## 2019-01-24 NOTE — Unmapped (Signed)
It was a pleasure to see you today!   

## 2019-01-24 NOTE — Unmapped (Signed)
Pt pain currently under control.Resting in bed calmly without any acute distress.Encouraged to call for help when in need.Will keep monitoring.  Problem: Adult Inpatient Plan of Care  Goal: Plan of Care Review  Outcome: Progressing  Goal: Patient-Specific Goal (Individualization)  Outcome: Progressing  Goal: Absence of Hospital-Acquired Illness or Injury  Outcome: Progressing  Goal: Optimal Comfort and Wellbeing  Outcome: Progressing  Goal: Readiness for Transition of Care  Outcome: Progressing  Goal: Rounds/Family Conference  Outcome: Progressing     Problem: Infection  Goal: Infection Symptom Resolution  Outcome: Progressing     Problem: Fall Injury Risk  Goal: Absence of Fall and Fall-Related Injury  Outcome: Progressing     Problem: Diabetes Comorbidity  Goal: Blood Glucose Level Within Desired Range  Outcome: Progressing     Problem: Wound  Goal: Optimal Wound Healing  Outcome: Progressing

## 2019-01-24 NOTE — Unmapped (Unsigned)
ENDOCRINE/DIABETES TELEVISIT (VIDEO VISIT)    I spent *** minutes on the {phone audio video visit:[id number]} with the patient. I spent an additional *** minutes on pre- and post-visit activities.     The patient was physically located in [place] or a state in which I am permitted to provide care. The patient and/or parent/guardian understood that s/he may incur co-pays and cost sharing, and agreed to the telemedicine visit. The visit was reasonable and appropriate under the circumstances given the patient's presentation at the time.    The patient and/or parent/guardian has been advised of the potential risks and limitations of this mode of treatment (including, but not limited to, the absence of in-person examination) and has agreed to be treated using telemedicine. The patient's/patient's family's questions regarding telemedicine have been answered.     If the phone/video visit was completed in an ambulatory setting, the patient and/or parent/guardian has also been advised to contact their provider???s office for worsening conditions, and seek emergency medical treatment and/or call 911 if the patient deems either necessary.        Chief Complaint:     33 y.o. male with a PMH of CF, HTN w CFRD.   No chief complaint on file.    Subjective:     HPI   Last seen by myself 10/08/2017. CFRD dx'd ~2/19.     Last seen by myself 06/19/2018  At last visit:   ? Congratulations on your good results  ? Increase Basaglar to 70 units  ? Increase  Novolog to 34 units  ? OK to adjust Basaglar upward/downward in 10-20% increments every 3-7 days based on pattern  ? Baqsimi glucagon nasal if covered  - Ambulatory referral to Diabetic Education; Future for discussion of pump therapy/options      Since last visit:         Currently taking the following for CFRD:   Basaglar Lantus insulin 60 units qhs  Aspart insulin 30 units ac meals, 8 units ac snacks + 4:50>150    Denies symptoms consistent with peripheral or autonomic neuropathy Denies recent lows requiring assistance  Denies recent nocturnal lows      Pump***  Cgm***  Smbg***    _______________________________    Since last visit:   Hospitalizations May and August d/t CF, seen by inpatient endo consult service  Contacted by Ms [name] RN via CF clinic re: pt request for Dexcom G6, I escripted  States not feeling his best today, thinks starting to have CF exac, to see pulm after our visit today  Using the Dexcom and pleased w it, here today w his wife who is EMT  Has questions about pump therapy pros/cons/technique  Wife recalls he's had 1-2 lows while shopping to 30-40 range where he's relatively asymptomatic, none recently    Currently taking the following for CFRD:   Basaglar Lantus insulin 60 units qhs  Aspart insulin 30 units ac meals, 8 units ac snacks + 4:50>150    Denies symptoms consistent with peripheral or autonomic neuropathy  Denies recent lows requiring assistance  Denies recent nocturnal lows      Continuous glucose monitor (CGM) was computer downloaded, I personally reviewed greater than 72 hours of data and interpreted, and discussed the findings with the patient. Data summery pasted in CMA note above, notable for mostly sustained  and also postprandial hyperglycemia      Initial HPI carried forward.   Patient is a 33 year old male with a PMH of CF,  HTN and GAD/MDD who presents for a recurrence of CFRD.     The patient was initially treated for cystic fibrosis related diabetes (CFRD) when he received his care at [facility] with 5 units of levemir at night. Subsequently, after moving to [place], he was taken off of his diabetes medications which was approximately 3 years ago. Since then he has continued to check his blood sugar approximately three times per day. Recently, his blood sugars have been typically running 200's - 300's. He was scheduled for endocrinology assessment for CFRD and prior to today's appointment, his hemoglobin A1c was found to be 7.6%. Upon arrival to the clinic today, his POCT glucose was 306 mg/dL.     He denies any regular hypglycemic episodes. If he does not eat much, he reports that he will occasionally dip lower than usual (approxiamtely once per week his blood glucose will run <100 mg/dL and approximately once per month his blood glucose will be <60 mg/dL). He states that recently, however, the lowest he has seen is 160 mg/dL. He endorses occasional blurriness in his vision and denies any neuropathic symptoms in his feet. He requires a new meter. His cystic fibrosis affects his lungs, sinuses and GI tract; he endorses pancreatic involvement. He does not appear malnourished. Most recently he was hospitalized in January 2019 for a CF-bronchiectasis exacerbation. He had a RUL lobectomy in August 2018. For his HTN, he takes lisinopril and carvedilol and he takes lamotrigine for his GAD and MDD.    The past medical history, surgical history, family history, social history, medications and allergies were reviewed in Epic.    Past Medical History:   Diagnosis Date   ??? Anxiety    ??? Chronic pain disorder    ??? Cystic fibrosis (CMS-HCC)    ??? Depression    ??? Hypertension    ??? Nonproductive cough 04/05/2018     Past Surgical History:   Procedure Laterality Date   ??? PR REMOVAL OF LUNG,LOBECTOMY Right 03/29/2017    Procedure: REMOVAL OF LUNG, OTHER THAN PNEUMONECTOMY; SINGLE LOBE (LOBECTOMY);  Surgeon: Cherie Dark, MD;  Location: MAIN OR [facility];  Service: Thoracic     Current Facility-Administered Medications on File Prior to Visit   Medication Dose Route Frequency Provider Last Rate Last Dose   ??? acetaminophen (TYLENOL) tablet 650 mg  650 mg Oral Q4H PRN Alphonzo Lemmings, MD   650 mg at 01/21/19 1845   ??? albuterol 2.5 mg /3 mL (0.083 %) nebulizer solution 2.5 mg  2.5 mg Nebulization Q6H PRN Alphonzo Lemmings, MD   2.5 mg at 01/23/19 0835   ??? amitriptyline (ELAVIL) tablet 50 mg  50 mg Oral Nightly Rayetta Pigg, MD   50 mg at 01/22/19 2210   ??? arformoterol (BROVANA) nebulizer solution 15 mcg/2 mL  15 mcg Nebulization BID (RT) Escher Ernst Breach, MD   15 mcg at 01/23/19 0837   ??? budesonide (PULMICORT) nebulizer solution 0.5 mg  0.5 mg Nebulization Daily (RT) Escher Ernst Breach, MD   0.5 mg at 01/23/19 0846   ??? cetirizine (ZyrTEC) tablet 10 mg  10 mg Oral Daily Escher Lauren Howard-Williams, MD   10 mg at 01/23/19 0955   ??? dextrose 50 % in water (D50W) 50 % solution 12.5 g  12.5 g Intravenous Q10 Min PRN Escher Ernst Breach, MD       ??? dornase alfa (PULMOZYME) 1 mg/mL nebulizer solution 2.5 mg  2.5 mg Inhalation BID Escher Ernst Breach, MD  2.5 mg at 01/23/19 0846   ??? FLUoxetine (PROzac) capsule 40 mg  40 mg Oral Daily Escher Ernst Breach, MD   40 mg at 01/23/19 0954   ??? gabapentin (NEURONTIN) capsule 200 mg  200 mg Oral TID Alphonzo Lemmings, MD   200 mg at 01/23/19 1505   ??? insulin glargine (LANTUS) injection 70 Units  70 Units Subcutaneous Nightly Escher Ernst Breach, MD   70 Units at 01/22/19 2212   ??? insulin lispro (HumaLOG) injection 0-12 Units  0-12 Units Subcutaneous ACHS Escher Ernst Breach, MD   4 Units at 01/23/19 1315   ??? insulin lispro (HumaLOG) injection 45 Units  45 Units Subcutaneous 3xd Meals Escher Ernst Breach, MD   45 Units at 01/23/19 1315   ??? lamoTRIgine (LaMICtal) tablet 200 mg  200 mg Oral Daily Escher Ernst Breach, MD   200 mg at 01/23/19 0954   ??? lisinopriL (PRINIVIL,ZESTRIL) tablet 10 mg  10 mg Oral Daily Escher Ernst Breach, MD   10 mg at 01/23/19 0954   ??? melatonin tablet 10.5 mg  10.5 mg Oral Nightly Escher Ernst Breach, MD   10.5 mg at 01/22/19 2209   ??? montelukast (SINGULAIR) tablet 10 mg  10 mg Oral Nightly Escher Ernst Breach, MD   10 mg at 01/22/19 2210   ??? multivitamins, therapeutic with minerals tablet 2 tablet  2 tablet Oral Daily Escher Ernst Breach, MD   2 tablet at 01/23/19 0955   ??? oxyCODONE (ROXICODONE) immediate release tablet 5 mg  5 mg Oral Q4H PRN Rayetta Pigg, MD   5 mg at 01/22/19 2129   ??? pancrelipase (Lip-Prot-Amyl) (CREON) 24,000-76,000 -120,000 unit delayed release capsule 216,000 units of lipase  9 capsule Oral With snacks Alphonzo Lemmings, MD   216,000 units of lipase at 01/20/19 2235   ??? pancrelipase (Lip-Prot-Amyl) (CREON) 24,000-76,000 -120,000 unit delayed release capsule 288,000 units of lipase  12 capsule Oral 3xd Meals Escher Ernst Breach, MD   288,000 units of lipase at 01/23/19 1316   ??? pantoprazole (PROTONIX) EC tablet 20 mg  20 mg Oral Daily Escher Ernst Breach, MD   20 mg at 01/23/19 0954   ??? piperacillin-tazobactam (ZOSYN) IVPB (premix) 4.5 g  4.5 g Intravenous Q6H Escher Lauren Howard-Williams, MD 200 mL/hr at 01/23/19 1505 4.5 g at 01/23/19 1505   ??? pramipexole (MIRAPEX) tablet 0.25 mg  0.25 mg Oral Nightly Escher Lauren Howard-Williams, MD   0.25 mg at 01/22/19 2223   ??? rivaroxaban (XARELTO) tablet 20 mg  20 mg Oral Daily Escher Lauren Howard-Williams, MD   20 mg at 01/23/19 0955   ??? sodium chloride 7% nebulizer solution 4 mL  4 mL Nebulization BID (RT) Escher Ernst Breach, MD   4 mL at 01/23/19 0841   ??? tezacaftor 100mg /ivacaftor 150mg  and ivacaftor 150mg  (SYMDEKO) tablets   Oral BID Alphonzo Lemmings, MD   Stopped at 01/21/19 [id number]   ??? tobramycin (NEBCIN) 240 mg in sodium chloride (NS) 0.9 % 100 mL IVPB  240 mg Intravenous Q12H Escher Ernst Breach, MD 232 mL/hr at 01/23/19 0954 240 mg at 01/23/19 0954   ??? traZODone (DESYREL) tablet 200 mg  200 mg Oral Nightly Escher Ernst Breach, MD   200 mg at 01/22/19 2211     Current Outpatient Medications on File Prior to Visit   Medication Sig Dispense Refill   ??? albuterol (PROVENTIL HFA;VENTOLIN HFA) 90 mcg/actuation inhaler Inhale 2 puffs every six (6) hours as needed. 1 Inhaler 1   ???  albuterol 2.5 mg/0.5 mL nebulizer solution Inhale 0.5 mL (2.5 mg total) by nebulization every six (6) hours as needed for wheezing. 90 each 3   ??? amitriptyline (ELAVIL) 25 MG tablet Take 25 mg by mouth nightly.     ??? blood sugar diagnostic Strp Dispense 150 blood glucose test strips, ok to sub any brand preferred by insurance/patient, use up to 5x/day, dx E 08.9 450 strip 12   ??? blood-glucose meter kit Dispense meter that is preferred by patient's insurance company 1 each 0   ??? blood-glucose meter,continuous (DEXCOM G6 RECEIVER) Misc Use as directed 1 each 0   ??? blood-glucose sensor (DEXCOM G6 SENSOR) Devi Use sq as directed every 14 days 6 Device 11   ??? blood-glucose transmitter (DEXCOM G6 TRANSMITTER) Devi Use as directed 1 Device 11   ??? budesonide-formoterol (SYMBICORT) 160-4.5 mcg/actuation inhaler Inhale 2 puffs Two (2) times a day. 20 g 11   ??? cetirizine (ZYRTEC) 10 MG tablet TAKE 1 TABLET (10 MG TOTAL) BY MOUTH DAILY. 90 tablet 3   ??? diclofenac sodium (VOLTAREN) 1 % gel Apply 2 g topically Four (4) times a day. 100 g 2   ??? dornase alfa (PULMOZYME) 1 mg/mL nebulizer solution Inhale 2.5 mg Two (2) times a day. 450 mL 3   ??? EASY TOUCH LANCING DEVICE Misc Use as directed.     ??? EASY TOUCH TWIST LANCETS 30 gauge Misc Use as directed.     ??? FLUoxetine (PROZAC) 20 MG capsule Take 40 mg by mouth daily.     ??? gabapentin enacarbil (HORIZANT) 600 mg TbER Take 1,200 mg by mouth nightly.      ??? glucagon, human recombinant, (GLUCAGON) 1 mg/ml injection Inject 1 mL (1 mg total) under the skin once as needed (severe hypoglycemia). Follow package directions for low blood sugar. (Patient not taking: Reported on 11/18/2018) 1 mL 11   ??? insulin ASPART (NOVOLOG FLEXPEN) 100 unit/mL (3 mL) injection pen Use up to 150 units/day, divided TID AC meals (Patient taking differently: 60-80 units TID AC) 135 mL 12   ??? insulin glargine (BASAGLAR, LANTUS) 100 unit/mL (3 mL) injection pen Inject 0.7 mL (70 Units total) under the skin daily. (Patient taking differently: Inject 70 Units under the skin nightly. ) 30 mL 0   ??? lamoTRIgine (LAMICTAL) 200 MG tablet Take 200 mg by mouth daily.     ??? lidocaine-prilocaine (EMLA) 2.5-2.5 % cream Apply topically every twelve (12) hours. 30 g 0   ??? lipase-protease-amylase (CREON) 36,000-114,000- 180,000 unit CpDR TAKE 7-8 CAPSULES BY MOUTH WITH MEALS AND 5-6 CAPSULES WITH SNACKS 1800 capsule 3   ??? lisinopriL (PRINIVIL,ZESTRIL) 10 MG tablet Take 1 tablet (10 mg total) by mouth daily. 30 tablet 0   ??? melatonin 10 mg Tab Take 10 mg by mouth nightly.     ??? montelukast (SINGULAIR) 10 mg tablet Take 1 tablet (10 mg total) by mouth nightly. 90 tablet 3   ??? MVW COMPLETE FORMUL PROBIOTIC 40 billion cell -15 mg CpDR Take 1 capsule by mouth daily. 90 capsule 3   ??? MVW COMPLETE FORMULATION D5000 5,000-800 unit-mcg cap Take 2 capsules by mouth daily. 180 capsule 3   ??? nebulizers Misc Use as directed with inhaled medications 4 each 3   ??? omeprazole (PRILOSEC) 20 MG capsule Take 1 capsule (20 mg total) by mouth daily. 30 capsule 0   ??? pen needle, diabetic (BD ULTRA-FINE NANO PEN NEEDLE) 32 gauge x 5/32 Ndle ok to sub any brand or  size needle preferred by insurance/patient, use up to 4x/day, dx E 08.9 450 each 12   ??? pramipexole (MIRAPEX) 0.125 MG tablet Take 0.25 mg by mouth daily.      ??? sodium chloride 7% 7 % Nebu Inhale 4 mL by nebulization Two (2) times a day. Increase to 4 times while taking antibiotics (Patient taking differently: Inhale 4 mL by nebulization Two (2) times a day. ) 720 mL 3   ??? tezacaftor 100mg /ivacaftor 150mg  and ivacaftor 150mg  (SYMDEKO) tablets TAKE BY MOUTH AS DIRECTED ON PACKAGE LABELING 56 each 11   ??? tobramycin (BETHKIS) 300 mg/4 mL Nebu Inhale 300 mg every twelve (12) hours. For 28 days, alternating every other 28 days 224 mL 6   ??? traZODone (DESYREL) 100 MG tablet Take 2 tablets (200 mg total) by mouth nightly. 60 tablet 0   ??? XARELTO 20 mg tablet Take 20 mg by mouth daily.     ??? [DISCONTINUED] insulin detemir U-100 (LEVEMIR FLEXTOUCH U-100 INSULN) 100 unit/mL (3 mL) injection pen Use up to 30 units per day, as per MD instructions 30 mL 12     SOCIAL HISTORY:  Social History     Social History Narrative   ??? Not on file       FAMILY HISTORY:  Family History   Problem Relation Age of Onset   ??? Bipolar disorder Mother    ??? Depression Mother      ROS:  The balance of 10 systems was reviewed and unremarkable except as stated above.     ***   Objective:   There were no vitals taken for this visit.  CONSTITUTIONAL: Alert and oriented with appropriate mood and affect.   NECK: Supple without carotid bruits, thyromegaly or lymphadenopathy.   HEART: Regular rate and rhythm without murmur, rub, gallop.  ABDOMEN: Soft and nontender without masses.   EXTREMITIES: Showed no edema    REVIEW OF RECENT PERTINENT LABORATORY AND IMAGING DATA:    Lab Results   Component Value Date    A1C 8.5 (H) 08/12/2018    A1C 10.7 (H) 04/06/2018    A1C 9.0 (H) 01/07/2018    A1C 6.1 (H) 11/19/2016    A1C 6.3 (H) 11/18/2016     Lab Results   Component Value Date    CREATININE 0.73 01/22/2019     Lab Results   Component Value Date    GFRNONAA >=60 01/08/2018     Lab Results   Component Value Date    GFRAA >=60 01/08/2018     Lab Results   Component Value Date    K 4.0 01/22/2019     No results found for: CHOL  No results found for: LDL  No results found for: HDL  No results found for: TRIG  Lab Results   Component Value Date    ALT 66 (H) 01/22/2019     No results found for: LABCREA, ALBQTUR, MSHCGMOM, ALBCRERAT  No results found for: VITAMINB12  Lab Results   Component Value Date    TSH 2.120 11/18/2016     Lab Results   Component Value Date    WBC 6.0 01/22/2019    HGB 13.7 01/22/2019    HCT 40.8 (L) 01/22/2019    PLT 289 01/22/2019     Lab Results   Component Value Date    VITDTOTAL 27.9 03/19/2017     Lab Results   Component Value Date    CALCIUM 8.9 01/22/2019       Assessment/Plan:  1. Diabetes mellitus related to CF (cystic fibrosis) (CMS-HCC)  He's doing well w insulin dosing and CGM though still having significant hyperglycemia on basal prandial regimen  Adjustments and self-titration instructions as below  Also discussed details of pump therapy, he'd be a good candidate, might benefit from some CHO counting for prandial dosing, not critical in my opinion however given ~glycemic stability  Patient Instructions   ? Congratulations on your good results  ? Increase Basaglar to 70 units  ? Increase  Novolog to 34 units  ? OK to adjust Basaglar upward/downward in 10-20% increments every 3-7 days based on pattern  ? Baqsimi glucagon nasal if covered  - Ambulatory referral to Diabetic Education; Future for discussion of pump therapy/options    2. Encounter for long-term (current) use of insulin (CMS-HCC)  As above    3. Obesity  Noted, encouraged dietary efforts, though need to balance against potential for malabsorption/caloric deficits related to CF    Follow up recommended in about 4-6 weeks, CDE hopefully same day as well to discuss pumps, he is aware to contact me or return sooner for interim concerns.

## 2019-01-25 NOTE — Unmapped (Signed)
Sleeping comfortably after a dose of pain med. Encouraged to give sputum specimen in the morning for lab test-acknowledged. No sob presented thus far.  Problem: Adult Inpatient Plan of Care  Goal: Plan of Care Review  Outcome: Ongoing - Unchanged  Goal: Patient-Specific Goal (Individualization)  Outcome: Ongoing - Unchanged  Flowsheets (Taken 01/25/2019 0451)  Patient-Specific Goals (Include Timeframe): Pt would be able to finish antibiotic course by 6/23.  Goal: Absence of Hospital-Acquired Illness or Injury  Outcome: Ongoing - Unchanged  Goal: Optimal Comfort and Wellbeing  Outcome: Ongoing - Unchanged  Goal: Readiness for Transition of Care  Outcome: Ongoing - Unchanged  Goal: Rounds/Family Conference  Outcome: Ongoing - Unchanged     Problem: Infection  Goal: Infection Symptom Resolution  Outcome: Ongoing - Unchanged     Problem: Fall Injury Risk  Goal: Absence of Fall and Fall-Related Injury  Outcome: Ongoing - Unchanged     Problem: Diabetes Comorbidity  Goal: Blood Glucose Level Within Desired Range  Outcome: Ongoing - Unchanged     Problem: Wound  Goal: Optimal Wound Healing  Outcome: Ongoing - Unchanged

## 2019-01-25 NOTE — Unmapped (Signed)
PULMONARY PROGRESS  NOTE      Patient: Christian Young([birth year])  Length of stay:  LOS: 5 days     Assessment and Plan:      Principal Problem:    Cystic fibrosis with pulmonary exacerbation (CMS-HCC)  Active Problems:    Essential hypertension    Depressive disorder    Anxiety    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)    Chronic pansinusitis    Pancreatic insufficiency due to cystic fibrosis (CMS-HCC)    Bronchiectasis (CMS-HCC)    Chronic deep vein thrombosis (DVT) of lower extremity (CMS-HCC)    Obesity (BMI 30-39.9)    Restless leg syndrome  Resolved Problems:    * No resolved hospital problems. *    CF exacerbation w/ pulmonary manifestations w/ acute exacerbation of bronchiectasis:??Presented with increased cough and sputum production, SOB, as well as low grade fevers. CXR not ordered by ED. COVID pcr is negative 01/20/2019.??His last sputum culture 11/20/2018 showed: 1+ Mucoid Pseudomonas sensitive to Zosyn, Ceftaz, Aztreonam and 1+ Smooth Pseudomonas  intermediate sensitivity to Tobra and resistant to all else.     - reordered sputum bacterial and AFB cultures as they were previously cancelled    - IV tobramycin 220 mg q12h dosing by Pharmacy and IV Zosyn 4.5 g 6h to complete in house.    - Continue home??Symdeko - pt has home supply    - Pulmozyme 2.5 mg BID  - Pulmicort and brovana nebs BID to replace home symbicort  - HTS 7% QID w/ RT  - HTS 7% Aerobika and chest vest QID w/ RT    ??- montelukast 10 mg qHS  - cetirizine 10 mg daily  ??  History of Mycobacterium abscessus infection:??Required RULobectomy in August 2018.??Previously??on clofazimine and azithromycin. Plan to repeat AFB smears every 3 months (starting February 2020).??  - AFB smear reordered  ??  CF-related PI  - HPHC diet  - Creon??7??capsules w/ meals, 4 w/ snacks  - MVW 2 capsules daily  ??  CF-related DM  Last A1c??8.5% (December)  - glargine 70 units qHS  - lispro??45 units TID AC (down from Carb counting dose of 60, increase as tolerated)  - SSI??  ??  Depression, anxiety  - Fluoxetine 40 mg daily  - Lamotrigine 100 mg daily    HTN  - Lisinopril 10 mg daily  ??  History of DVT  - Rivaroxaban 20 mg daily  ??  RLS  - Pramipexole 0.25 mg daily  - Elavil QHS   - Gabapentin TID  ??  Code??status:??full code    Please page [phone] with any questions.    Verne Grain, MD    Subjective:      History of Present Illness:  [name] is a 33 y.o. male admitted for CF exacerbation    No acute events overnight, continues to report improvement with decreased frequency of cough.    Review of Systems: A comprehensive review of systems was performed and was negative except as above in HPI  Past Medical History:   Diagnosis Date   ??? Anxiety    ??? Chronic pain disorder    ??? Cystic fibrosis (CMS-HCC)    ??? Depression    ??? Hypertension    ??? Nonproductive cough 04/05/2018     Past Surgical History:   Procedure Laterality Date   ??? PR REMOVAL OF LUNG,LOBECTOMY Right 03/29/2017    Procedure: REMOVAL OF LUNG, OTHER THAN PNEUMONECTOMY; SINGLE LOBE (LOBECTOMY);  Surgeon: Inda Castle  Long, MD;  Location: MAIN OR [facility];  Service: Thoracic     Medications reviewed in Epic  Allergies as of 01/20/2019 - Reviewed 01/20/2019   Allergen Reaction Noted   ??? [place] [aztreonam lysine] Anaphylaxis 12/27/2016   ??? Cefepime Itching and Nausea Only 09/06/2015   ??? Other Anaphylaxis and Other (See Comments) 09/06/2015   ??? Slo-bid 100 Anaphylaxis 06/20/2017   ??? Banana Itching and Nausea And Vomiting 07/29/2016   ??? Tobramycin Tinnitus 09/06/2015     Family History   Problem Relation Age of Onset   ??? Bipolar disorder Mother    ??? Depression Mother      Social History     Tobacco Use   ??? Smoking status: Never Smoker   ??? Smokeless tobacco: Never Used   Substance Use Topics   ??? Alcohol use: Yes     Alcohol/week: 3.0 standard drinks     Types: 3 Glasses of wine per week     Comment: 3 bottles of wine 2 days ago        Objective:      Physical Exam:  Vitals:    01/24/19 1956 01/24/19 2051 01/25/19 0736 01/25/19 0909   BP: 118/73  113/69    Pulse: 89 96 80 91   Resp: 18 18 16 16    Temp: 36.5 ??C (97.7 ??F)  36 ??C (96.8 ??F)    TempSrc: Oral  Oral    SpO2: 97% 96% 96% 95%   Weight:       Height:         General: Alert, well-appearing, and in no distress.  Eyes: Anicteric sclera, conjunctiva clear.  ENT:  Mucous membranes moist and intact.  Lymph: No cervical or supraclavicular adenopathy.  Lungs: Normal excursion, no dullness to percussion. Good air movement bilaterally, without wheezes or crackles. Normal upper airway sounds without evidence of stridor.  Cardiovascular: Regular rate and rhythm, S1, S2 normal, no murmur, click, rub or gallop appreciated.  Abdomen: Soft, non-tender, not distended, bowel sounds are normal, liver is not enlarged, spleen is not enlarged  Musculoskeletal: No synovitis.  Skin: No rashes or lesions.  Neuro: No focal neurological deficits.    Malnutrition Assessment by RD:          Diagnostic Review:   All labs and images were personally reviewed.

## 2019-01-25 NOTE — Unmapped (Signed)
Pt alert and oriented x4. Reported pain to chest this morning from coughing. PRN tylenol given with effect. IVABX given per order. Pt tolerated well. Pt in NAD, VSS.       Problem: Adult Inpatient Plan of Care  Goal: Plan of Care Review  Outcome: Progressing  Goal: Patient-Specific Goal (Individualization)  Outcome: Progressing  Goal: Absence of Hospital-Acquired Illness or Injury  Outcome: Progressing  Goal: Optimal Comfort and Wellbeing  Outcome: Progressing  Goal: Readiness for Transition of Care  Outcome: Progressing  Goal: Rounds/Family Conference  Outcome: Progressing     Problem: Infection  Goal: Infection Symptom Resolution  Outcome: Progressing     Problem: Fall Injury Risk  Goal: Absence of Fall and Fall-Related Injury  Outcome: Progressing     Problem: Diabetes Comorbidity  Goal: Blood Glucose Level Within Desired Range  Outcome: Progressing     Problem: Wound  Goal: Optimal Wound Healing  Outcome: Progressing

## 2019-01-26 LAB — CBC
HEMOGLOBIN: 12.2 g/dL — ABNORMAL LOW (ref 13.5–17.5)
MEAN CORPUSCULAR HEMOGLOBIN CONC: 33 g/dL (ref 31.0–37.0)
MEAN CORPUSCULAR HEMOGLOBIN CONC: 33.5 g/dL (ref 31.0–37.0)
MEAN CORPUSCULAR HEMOGLOBIN: 27.6 pg (ref 26.0–34.0)
MEAN CORPUSCULAR HEMOGLOBIN: 28.2 pg (ref 26.0–34.0)
MEAN CORPUSCULAR VOLUME: 84.4 fL (ref 80.0–100.0)
MEAN PLATELET VOLUME: 7.2 fL (ref 7.0–10.0)
MEAN PLATELET VOLUME: 6.8 fL — ABNORMAL LOW (ref 7.0–10.0)
PLATELET COUNT: 298 10*9/L (ref 150–440)
PLATELET COUNT: 344 10*9/L (ref 150–440)
RED BLOOD CELL COUNT: 4.43 10*12/L — ABNORMAL LOW (ref 4.50–5.90)
RED BLOOD CELL COUNT: 4.5 10*12/L (ref 4.50–5.90)
RED CELL DISTRIBUTION WIDTH: 15.3 % — ABNORMAL HIGH (ref 12.0–15.0)
RED CELL DISTRIBUTION WIDTH: 15.7 % — ABNORMAL HIGH (ref 12.0–15.0)
WBC ADJUSTED: 7 10*9/L (ref 4.5–11.0)
WBC ADJUSTED: 7.6 10*9/L (ref 4.5–11.0)

## 2019-01-26 LAB — COMPREHENSIVE METABOLIC PANEL
ALBUMIN: 3.7 g/dL (ref 3.5–5.0)
ALBUMIN: 4.3 g/dL (ref 3.5–5.0)
ALKALINE PHOSPHATASE: 59 U/L (ref 38–126)
ALKALINE PHOSPHATASE: 72 U/L (ref 38–126)
ALT (SGPT): 59 U/L — ABNORMAL HIGH (ref <50)
ALT (SGPT): 63 U/L — ABNORMAL HIGH (ref <50)
ANION GAP: 9 mmol/L (ref 7–15)
ANION GAP: 11 mmol/L (ref 7–15)
AST (SGOT): 45 U/L (ref 19–55)
BILIRUBIN TOTAL: 0.2 mg/dL (ref 0.0–1.2)
BILIRUBIN TOTAL: 0.4 mg/dL (ref 0.0–1.2)
BLOOD UREA NITROGEN: 10 mg/dL (ref 7–21)
BLOOD UREA NITROGEN: 9 mg/dL (ref 7–21)
BUN / CREAT RATIO: 14
BUN / CREAT RATIO: 12
CALCIUM: 8.4 mg/dL — ABNORMAL LOW (ref 8.5–10.2)
CALCIUM: 8.6 mg/dL (ref 8.5–10.2)
CHLORIDE: 104 mmol/L (ref 98–107)
CHLORIDE: 100 mmol/L (ref 98–107)
CO2: 27 mmol/L (ref 22.0–30.0)
CO2: 26 mmol/L (ref 22.0–30.0)
CREATININE: 0.74 mg/dL (ref 0.70–1.30)
CREATININE: 0.76 mg/dL (ref 0.70–1.30)
EGFR CKD-EPI AA MALE: >90 mL/min/{1.73_m2} (ref >=60)
EGFR CKD-EPI NON-AA MALE: >90 mL/min/{1.73_m2} (ref >=60)
EGFR CKD-EPI NON-AA MALE: >90 mL/min/{1.73_m2} (ref >=60)
GLUCOSE RANDOM: 115 mg/dL (ref 70–179)
GLUCOSE RANDOM: 267 mg/dL — ABNORMAL HIGH (ref 70–179)
POTASSIUM: 4.2 mmol/L (ref 3.5–5.0)
POTASSIUM: 4.6 mmol/L (ref 3.5–5.0)
PROTEIN TOTAL: 6.2 g/dL — ABNORMAL LOW (ref 6.5–8.3)
PROTEIN TOTAL: 6.9 g/dL (ref 6.5–8.3)
SODIUM: 140 mmol/L (ref 135–145)
SODIUM: 137 mmol/L (ref 135–145)

## 2019-01-26 LAB — MEAN PLATELET VOLUME: Lab: 7.2

## 2019-01-26 LAB — TOBRAMYCIN RANDOM
Tobramycin:MCnc:Pt:Ser/Plas:Qn:: 1.2
Tobramycin:MCnc:Pt:Ser/Plas:Qn:: 7.6

## 2019-01-26 LAB — MAGNESIUM
Magnesium:MCnc:Pt:Ser/Plas:Qn:: 1.8
Magnesium:MCnc:Pt:Ser/Plas:Qn:: 1.8

## 2019-01-26 LAB — ALT (SGPT): Alanine aminotransferase:CCnc:Pt:Ser/Plas:Qn:: 63 — ABNORMAL HIGH

## 2019-01-26 LAB — EGFR CKD-EPI AA MALE: Lab: >90

## 2019-01-26 LAB — WBC ADJUSTED: Lab: 7.6

## 2019-01-26 NOTE — Unmapped (Signed)
Confirmed Allergies, Medication and Travel Screening.    Precharting for 01/27/2019.    01/26/19    Travel Screening Questions Completed.    Travel Screening Questions/Answers:  1). Have you traveled within the last 14 days?: No  2). Do you have any of the following symptoms that are new or worsening: cough, shortness of breath, loss of taste/smell, sore throat, fever or feeling feverish, repeated shaking chills, muscle pain, vomiting, or diarrhea?: No  3). Have you had close contact with a person with confirmed COVID-19 in the last 21 days before symptoms began?: No  4). Have you tested positive in the last 21 days for COVID-19?: No      Negative Travel Screen: Patient answered NO to questions 2, 3, and 4. Offer Telephone visit, Virtual Visit or In Person Visit according to the current scheduling protocol for your clinic.       The call was handled in the following manner: Scheduled for Virtual Visit     Pt has been sent Dexcom link with step by step instructions on how to share it to clinic. Pt has been advised to inform me when Dexcom has been shared.

## 2019-01-26 NOTE — Unmapped (Signed)
PULMONARY PROGRESS  NOTE      Patient: Christian Young([birth year])  Length of stay:  LOS: 6 days     Assessment and Plan:      Principal Problem:    Cystic fibrosis with pulmonary exacerbation (CMS-HCC)  Active Problems:    Essential hypertension    Depressive disorder    Anxiety    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)    Chronic pansinusitis    Pancreatic insufficiency due to cystic fibrosis (CMS-HCC)    Bronchiectasis (CMS-HCC)    Chronic deep vein thrombosis (DVT) of lower extremity (CMS-HCC)    Obesity (BMI 30-39.9)    Restless leg syndrome  Resolved Problems:    * No resolved hospital problems. *    CF exacerbation w/ pulmonary manifestations w/ acute exacerbation of bronchiectasis:??Presented with increased cough and sputum production, SOB, as well as low grade fevers. CXR not ordered by ED. COVID pcr is negative 01/20/2019.??His last sputum culture 11/20/2018 showed: 1+ Mucoid Pseudomonas sensitive to Zosyn, Ceftaz, Aztreonam and 1+ Smooth Pseudomonas  intermediate sensitivity to Tobra and resistant to all else.   - still needing sample for sputum bacterial and AFB cultures   - IV tobramycin 240 mg q12h dosing by Pharmacy and IV Zosyn 4.5 g 6h to complete in house, likely total 12 days with discharge Sunday 6/21.  - Continue home??Symdeko, pt has home supply now here  - Pulmozyme 2.5 mg BID  - Pulmicort and brovana nebs BID to replace home symbicort  - HTS 7% QID w/ RT  - HTS 7% Aerobika and chest vest QID w/ RT  - montelukast 10 mg qHS  - cetirizine 10 mg daily  ??  History of Mycobacterium abscessus infection:??Required RULobectomy in August 2018.??Previously??on clofazimine and azithromycin. Plan to repeat AFB smears every 3 months (starting February 2020).??  - AFB smear reordered  ??  CF-related PI  - HPHC diet  - Creon??7??capsules w/ meals, 4 w/ snacks  - MVW 2 capsules daily  ??  CF-related DM  Last A1c??8.5% (December)  - glargine 70 units qHS  - lispro??45 units TID AC (down from Carb counting dose of 60, increase as tolerated)  - SSI??  ??  Depression, anxiety  - Fluoxetine 40 mg daily  - Lamotrigine 100 mg daily    HTN  - Lisinopril 10 mg daily  ??  History of DVT  - Rivaroxaban 20 mg daily  ??  RLS  - Pramipexole 0.25 mg daily  - Elavil QHS   - Gabapentin TID  ??  Code??status:??full code    Please page [phone] with any questions.    Cecelia Byars, MD    Subjective:      History of Present Illness:  Christian Young is a 33 y.o. male admitted for CF exacerbation    No acute events overnight, continues to report improvement with decreased frequency of cough. Wondering about discharge on Sunday (12 days) for Father's Day discharge.    Review of Systems: A comprehensive review of systems was performed and was negative except as above in HPI  Past Medical History:   Diagnosis Date   ??? Anxiety    ??? Chronic pain disorder    ??? Cystic fibrosis (CMS-HCC)    ??? Depression    ??? Hypertension    ??? Nonproductive cough 04/05/2018     Past Surgical History:   Procedure Laterality Date   ??? PR REMOVAL OF LUNG,LOBECTOMY Right 03/29/2017    Procedure: REMOVAL OF LUNG, OTHER  THAN PNEUMONECTOMY; SINGLE LOBE (LOBECTOMY);  Surgeon: Cherie Dark, MD;  Location: MAIN OR [facility];  Service: Thoracic     Medications reviewed in Epic  Allergies as of 01/20/2019 - Reviewed 01/20/2019   Allergen Reaction Noted   ??? [place] [aztreonam lysine] Anaphylaxis 12/27/2016   ??? Cefepime Itching and Nausea Only 09/06/2015   ??? Other Anaphylaxis and Other (See Comments) 09/06/2015   ??? Slo-bid 100 Anaphylaxis 06/20/2017   ??? Banana Itching and Nausea And Vomiting 07/29/2016   ??? Tobramycin Tinnitus 09/06/2015     Family History   Problem Relation Age of Onset   ??? Bipolar disorder Mother    ??? Depression Mother      Social History     Tobacco Use   ??? Smoking status: Never Smoker   ??? Smokeless tobacco: Never Used   Substance Use Topics   ??? Alcohol use: Yes     Alcohol/week: 3.0 standard drinks     Types: 3 Glasses of wine per week     Comment: 3 bottles of wine 2 days ago        Objective:      Physical Exam:  Vitals:    01/25/19 0909 01/25/19 2008 01/25/19 2146 01/26/19 0848   BP:  129/75  117/67   Pulse: 91 104 96 90   Resp: 16 17 20 18    Temp:  36.8 ??C (98.2 ??F)  36.5 ??C (97.7 ??F)   TempSrc:  Oral  Oral   SpO2: 95% 100% 96% 96%   Weight:       Height:         General: Alert, well-appearing, and in no distress.  Eyes: Anicteric sclera, conjunctiva clear.  ENT:  Mucous membranes moist and intact.  Lymph: No cervical or supraclavicular adenopathy.  Lungs: Normal excursion, no dullness to percussion. Good air movement bilaterally, without wheezes; few coarse basilar crackles present. Normal upper airway sounds without evidence of stridor.  Cardiovascular: Regular rate and rhythm, S1, S2 normal, no murmur, click, rub or gallop appreciated.  Abdomen: Soft, non-tender, not distended, bowel sounds are normal, liver is not enlarged, spleen is not enlarged  Musculoskeletal: No synovitis.  Skin: No rashes or lesions.  Neuro: No focal neurological deficits.    Malnutrition Assessment by RD:          Diagnostic Review:   All labs and images were personally reviewed.

## 2019-01-26 NOTE — Unmapped (Signed)
POC discussed with patient.  Pain controlled with prn medications. No falls noted during my shift thus far and I will continue to monitor patient.  IV antibiotics given per orders. Plan for d/c next week.    Problem: Adult Inpatient Plan of Care  Goal: Plan of Care Review  Outcome: Progressing  Goal: Patient-Specific Goal (Individualization)  Outcome: Progressing  Goal: Absence of Hospital-Acquired Illness or Injury  Outcome: Progressing  Goal: Optimal Comfort and Wellbeing  Outcome: Progressing  Goal: Readiness for Transition of Care  Outcome: Progressing  Goal: Rounds/Family Conference  Outcome: Progressing     Problem: Infection  Goal: Infection Symptom Resolution  Outcome: Progressing     Problem: Fall Injury Risk  Goal: Absence of Fall and Fall-Related Injury  Outcome: Progressing     Problem: Diabetes Comorbidity  Goal: Blood Glucose Level Within Desired Range  Outcome: Progressing     Problem: Wound  Goal: Optimal Wound Healing  Outcome: Progressing

## 2019-01-26 NOTE — Unmapped (Unsigned)
Confirmed Allergies, Medication and Travel Screening.    Precharting for 01/27/2019.    01/26/19    Travel Screening Questions Completed.    Travel Screening Questions/Answers:  1). Have you traveled within the last 14 days?: No  2). Do you have any of the following symptoms that are new or worsening: cough, shortness of breath, loss of taste/smell, sore throat, fever or feeling feverish, repeated shaking chills, muscle pain, vomiting, or diarrhea?: No  3). Have you had close contact with a person with confirmed COVID-19 in the last 21 days before symptoms began?: No  4). Have you tested positive in the last 21 days for COVID-19?: No      Negative Travel Screen: Patient answered NO to questions 2, 3, and 4. Offer Telephone visit, Virtual Visit or In Person Visit according to the current scheduling protocol for your clinic.       The call was handled in the following manner: Scheduled for Virtual Visit     Pt has been sent Dexcom link with step by step instructions on how to share it to clinic. Pt has been advised to inform me when Dexcom has been shared.

## 2019-01-26 NOTE — Unmapped (Signed)
Pt with no falls or injuries this shift, and able to ambulate in room.  Bed low and locked, side rails up x 2, and call bell within reach.  Vitals WNL.  Pt being treated for CF exacerbation with IV antibiotics.  Pt with no complaints of pain.  Pt tolerating diet.  Will continue to monitor.    Problem: Adult Inpatient Plan of Care  Goal: Plan of Care Review  Outcome: Progressing  Goal: Patient-Specific Goal (Individualization)  Outcome: Progressing  Goal: Absence of Hospital-Acquired Illness or Injury  Outcome: Progressing  Goal: Optimal Comfort and Wellbeing  Outcome: Progressing  Goal: Readiness for Transition of Care  Outcome: Progressing  Goal: Rounds/Family Conference  Outcome: Progressing     Problem: Infection  Goal: Infection Symptom Resolution  Outcome: Progressing     Problem: Fall Injury Risk  Goal: Absence of Fall and Fall-Related Injury  Outcome: Progressing     Problem: Diabetes Comorbidity  Goal: Blood Glucose Level Within Desired Range  Outcome: Progressing     Problem: Wound  Goal: Optimal Wound Healing  Outcome: Progressing

## 2019-01-27 LAB — EGFR CKD-EPI NON-AA MALE: Lab: >90

## 2019-01-27 LAB — CREATININE: CREATININE: 0.73 mg/dL (ref 0.70–1.30)

## 2019-01-27 NOTE — Unmapped (Signed)
Stable on room air. Breathing treatments given as ordered. Airway clearance done with home Chest vest

## 2019-01-27 NOTE — Unmapped (Signed)
PULMONARY PROGRESS  NOTE      Patient: Christian Young(October 15, 1985)  Length of stay:  LOS: 7 days     Assessment and Plan:      Principal Problem:    Cystic fibrosis with pulmonary exacerbation (CMS-HCC)  Active Problems:    Essential hypertension    Depressive disorder    Anxiety    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)    Chronic pansinusitis    Pancreatic insufficiency due to cystic fibrosis (CMS-HCC)    Bronchiectasis (CMS-HCC)    Chronic deep vein thrombosis (DVT) of lower extremity (CMS-HCC)    Obesity (BMI 30-39.9)    Restless leg syndrome  Resolved Problems:    * No resolved hospital problems. *    CF exacerbation w/ pulmonary manifestations w/ acute exacerbation of bronchiectasis:??Presented with increased cough and sputum production, SOB, as well as low grade fevers. CXR not ordered by ED. COVID pcr is negative 01/20/2019.??His last sputum culture 11/20/2018 showed: 1+ Mucoid Pseudomonas sensitive to Zosyn, Ceftaz, Aztreonam and 1+ Smooth Pseudomonas  intermediate sensitivity to Tobra and resistant to all else.   - still would like to get sample for sputum bacterial and AFB cultures (repeat, known MAC recently)  - IV tobramycin 240 mg q12h dosing by Pharmacy and IV Zosyn 4.5 g 6h to complete in house, total 12 days with discharge Sunday 6/21.  - Continue home??Symdeko, pt has home supply now here  - Pulmozyme 2.5 mg BID  - Pulmicort and brovana nebs BID to replace home symbicort  - HTS 7% QID w/ RT  - HTS 7% Aerobika and chest vest QID w/ RT  - montelukast 10 mg qHS  - cetirizine 10 mg daily  ??  History of Mycobacterium abscessus infection:??Required RULobectomy in August 2018.??Previously??on clofazimine and azithromycin. Plan to repeat AFB smears every 3 months (starting February 2020).??  - AFB smear reordered  ??  CF-related PI  - HPHC diet  - Creon??7??capsules w/ meals, 4 w/ snacks  - MVW 2 capsules daily  ??  CF-related DM  Last A1c??8.5% (December)  - glargine 70 units qHS  - lispro??45 units TID AC (down from Carb counting dose of 60, increase as tolerated)  - SSI??  ??  Depression, anxiety  - Fluoxetine 40 mg daily  - Lamotrigine 100 mg daily    HTN  - Lisinopril 10 mg daily  ??  History of DVT  - Rivaroxaban 20 mg daily  ??  RLS  - Pramipexole 0.25 mg daily  - Elavil QHS   - Gabapentin TID  ??  Code??status:??full code    Please page [phone] with any questions.    Cecelia Byars, MD    Subjective:      History of Present Illness:  Christian Young is a 33 y.o. male admitted for CF exacerbation    No acute events overnight, continues to report improvement with decreased frequency of cough. Airway clearance going well.    Review of Systems: A comprehensive review of systems was performed and was negative except as above in HPI  Past Medical History:   Diagnosis Date   ??? Anxiety    ??? Chronic pain disorder    ??? Cystic fibrosis (CMS-HCC)    ??? Depression    ??? Hypertension    ??? Nonproductive cough 04/05/2018     Past Surgical History:   Procedure Laterality Date   ??? PR REMOVAL OF LUNG,LOBECTOMY Right 03/29/2017    Procedure: REMOVAL OF LUNG, OTHER THAN PNEUMONECTOMY;  SINGLE LOBE (LOBECTOMY);  Surgeon: Cherie Dark, MD;  Location: MAIN OR [facility];  Service: Thoracic     Medications reviewed in Epic  Allergies as of 01/20/2019 - Reviewed 01/20/2019   Allergen Reaction Noted   ??? [place] [aztreonam lysine] Anaphylaxis 12/27/2016   ??? Cefepime Itching and Nausea Only 09/06/2015   ??? Other Anaphylaxis and Other (See Comments) 09/06/2015   ??? Slo-bid 100 Anaphylaxis 06/20/2017   ??? Banana Itching and Nausea And Vomiting 07/29/2016   ??? Tobramycin Tinnitus 09/06/2015     Family History   Problem Relation Age of Onset   ??? Bipolar disorder Mother    ??? Depression Mother      Social History     Tobacco Use   ??? Smoking status: Never Smoker   ??? Smokeless tobacco: Never Used   Substance Use Topics   ??? Alcohol use: Yes     Alcohol/week: 3.0 standard drinks     Types: 3 Glasses of wine per week     Comment: 3 bottles of wine 2 days ago        Objective:      Physical Exam:  Vitals:    01/26/19 0848 01/26/19 2000 01/27/19 0818 01/27/19 0826   BP: 117/67 132/76 129/63    Pulse: 90 98 86    Resp: 18 18     Temp: 36.5 ??C (97.7 ??F) 36.3 ??C (97.3 ??F)  36 ??C (96.8 ??F)   TempSrc: Oral Oral  Oral   SpO2: 96% 96% 96%    Weight:       Height:         General: Alert, well-appearing, and in no distress.  Eyes: Anicteric sclera, conjunctiva clear.  ENT:  Mucous membranes moist and intact.  Lymph: No cervical or supraclavicular adenopathy.  Lungs: Normal excursion, no dullness to percussion. Good air movement bilaterally, without wheezes; few coarse basilar crackles present. Normal upper airway sounds without evidence of stridor.  Cardiovascular: Regular rate and rhythm, S1, S2 normal, no murmur, click, rub or gallop appreciated.  Abdomen: Soft, non-tender, not distended, bowel sounds are normal, liver is not enlarged, spleen is not enlarged  Musculoskeletal: No synovitis.  Skin: No rashes or lesions.  Neuro: No focal neurological deficits.    Malnutrition Assessment by RD:          Diagnostic Review:   All labs and images were personally reviewed.

## 2019-01-27 NOTE — Unmapped (Signed)
Pt continues to receive IV antibiotics for CF exacerbation via port. Remains w/o adverse reaction. Pt is ambulating around his room, watching TV/playing video games. Good appetite, consumed 100% of both breakfast & lunch. Denied pain. Will continue with POC.         Problem: Adult Inpatient Plan of Care  Goal: Plan of Care Review  Outcome: Progressing  Goal: Patient-Specific Goal (Individualization)  Outcome: Progressing  Goal: Absence of Hospital-Acquired Illness or Injury  Outcome: Progressing  Goal: Optimal Comfort and Wellbeing  Outcome: Progressing  Goal: Readiness for Transition of Care  Outcome: Progressing  Goal: Rounds/Family Conference  Outcome: Progressing     Problem: Infection  Goal: Infection Symptom Resolution  Outcome: Progressing     Problem: Fall Injury Risk  Goal: Absence of Fall and Fall-Related Injury  Outcome: Progressing     Problem: Diabetes Comorbidity  Goal: Blood Glucose Level Within Desired Range  Outcome: Progressing     Problem: Wound  Goal: Optimal Wound Healing  Outcome: Progressing

## 2019-01-27 NOTE — Unmapped (Signed)
Aminoglycoside Therapeutic Monitoring Pharmacy Note    Christian Young is a 33 y.o. male continuing tobramycin. Date of therapy initiation: 01/20/19    Indication: CF exacerbation    Prior Dosing Information: Current regimen 240 mg IV q 12 hours infused over 30 minutes       Goals:  Therapeutic Drug Levels  ?? Trough level: tobramycin <1 mg/L  ?? Peak level: tobramycin 10-14 mg/L    Additional Clinical Monitoring/Outcomes  Renal function, volume status (intake and output)    Results:   ?? Peak: 7.6 mg/L (extrapolated peak: 10.5 mg/L)    ?? Random: 1.2 mg/L (extrapolated trough: 0.3 mg/L)     Wt Readings from Last 1 Encounters:   01/20/19 (!) 117.9 kg (260 lb)     Lab Results   Component Value Date    CREATININE 0.76 01/26/2019       Pharmacokinetic Considerations and Significant Drug Interactions:  ? Adult (calculated on 01/26/19): Vd = 21.9 L, ke = 0.3 hr-1  ? Concurrent nephrotoxic meds: Zosyn    Assessment/Plan:  Recommendation(s)  ? Continue current regimen of 240 mg IV q 12 hours   ? Estimated peak and trough on recommended regimen: peak = 10.5 mg/L, trough = 0.3 mg/L    Follow-up  ? Level due: in 3-5 days  ? A pharmacist will continue to monitor and order levels as appropriate    Please page service pharmacist with questions/clarifications.    Nino Parsley, PharmD

## 2019-01-27 NOTE — Unmapped (Signed)
Pt calm and cooperative. Pt compliant with medications. Side table/call bell at bedside. Will continue to monitor.  Problem: Adult Inpatient Plan of Care  Goal: Plan of Care Review  Outcome: Progressing  Goal: Patient-Specific Goal (Individualization)  Outcome: Progressing  Goal: Absence of Hospital-Acquired Illness or Injury  Outcome: Progressing  Goal: Optimal Comfort and Wellbeing  Outcome: Progressing  Goal: Readiness for Transition of Care  Outcome: Progressing  Goal: Rounds/Family Conference  Outcome: Progressing     Problem: Infection  Goal: Infection Symptom Resolution  Outcome: Progressing     Problem: Fall Injury Risk  Goal: Absence of Fall and Fall-Related Injury  Outcome: Progressing     Problem: Diabetes Comorbidity  Goal: Blood Glucose Level Within Desired Range  Outcome: Progressing     Problem: Wound  Goal: Optimal Wound Healing  Outcome: Progressing

## 2019-01-27 NOTE — Unmapped (Unsigned)
Patient is currently hospitalized. Today's appointment Dr. Monia Sabal rescheduled.

## 2019-01-28 NOTE — Unmapped (Signed)
Scheduled treatments and airway clearance done as ordered

## 2019-01-28 NOTE — Unmapped (Signed)
POC reviewed with patient at start of shift. VSS and afebrile.  Pt reports chronic chest pain that is controlled with oxycodone.  Port dressing changed due to not being occlusive.  IVF infusing at [facility].  Port slightly difficult to flush but this has been present prior to this admission.  IV abxx administered per order. Will continue to monitor.  Problem: Adult Inpatient Plan of Care  Goal: Plan of Care Review  Outcome: Progressing  Goal: Patient-Specific Goal (Individualization)  Outcome: Progressing  Goal: Absence of Hospital-Acquired Illness or Injury  Outcome: Progressing  Goal: Optimal Comfort and Wellbeing  Outcome: Progressing  Goal: Readiness for Transition of Care  Outcome: Progressing  Goal: Rounds/Family Conference  Outcome: Progressing     Problem: Infection  Goal: Infection Symptom Resolution  Outcome: Progressing     Problem: Fall Injury Risk  Goal: Absence of Fall and Fall-Related Injury  Outcome: Progressing     Problem: Diabetes Comorbidity  Goal: Blood Glucose Level Within Desired Range  Outcome: Progressing     Problem: Wound  Goal: Optimal Wound Healing  Outcome: Progressing

## 2019-01-28 NOTE — Unmapped (Signed)
Cystic Fibrosis Nutrition Assessment  *Patient on contact precautions related to CF, nutrition assessment conducted by telephone to preserve PPE considering COVID-19 pandemic*    Inpatient: Nutrition Risk Screening this admission for CF and related follow up  Primary Pulmonologist: Dr. [name]  ===================================================================  Christian Young is a 33 y.o. male currently admitted with CF exacerbation for IV antibiotics.  - Home enzyme Creon 36,000 not on inpatient drug formulary, okay to sub with Creon 24,000 (at adjusted dose) during admission.  - RD spoke with [facility] via phone today. He reports appetite is improving. Denies constipation, s/s of malabsorption.   ===================================================================  INTERVENTION  1. During admission continue Creon 24,000 (12 at meals, 9 at snacks).  - Can resume home Creon 36,000 after discharge.    2. Recommend adjust vitamin regimen:  - STOP adult multivitamin  - START CF vitamin and supplemental vitamin D:  MVW Complete Formulation gel cap 2 daily and 6000 International units vitamin D daily daily. This regimen will provide a total of 9000 International units vitamin D daily.  - When discharged home to resume usual Complete Formulation D5000 softgels, 2 daily. Which provides total of 10,000 International units vitamin D daily.    3. Weigh patient twice weekly this admit.    4. Recommend check PT. If elevated start 5mg  phytonadione daily x 5 days, then recheck PT. If WNL start 5mg  phytonadione twice weekly while on IV antibiotics.    5. Continue remainder of nutrition regimen:  - acid reducer  - regular diet with double portions  - insulin as appropriate    Inpatient:   Will follow up with patient per protocol: 1-2 times per week (and more frequent as indicated)===================================================================  ASSESSMENT:  Cystic Fibrosis Nutrition Category = Outstanding    Current diet is appropriate for CF. Patient continues to work towards goals for weight management.   Enzyme dose is within established guidelines. Vitamin prescription is not appropriate to reach/maintain optimal fat soluble vitamin levels. Patient would benefit from change in vitamin regimen. Sodium needs for CF met with PO and/or supplement. Bowel regimen is appropriate. Acid reducer appropriate for GERD and enzyme activation.    Malnutrition Assessment using AND/ASPEN Clinical Characteristics:  Nutrition focused physical exam not completed as visit today is via phone in an effort to minimize contact between patient and provider due to COVID-19 pandemic.    Goals:  1. Meet estimated daily needs: 2800-3276 kcals (24-28 kcals/kg/day); 140-187 gm pro (DRI x 1.5 -2); 3440 mL free water (Holliday Segar Method)  2. Reach/maintain established goals for CF:                Adult - BMI 22kg/m2 for CF females and 23kg/m2 for CF males  3. Normal fat-soluble vitamin levels: Vitamin A, Vitamin E and PT per lab range; Vitamin D 25OH total >30  4. Maintain glucose control. Carbohydrate content of diet should comprise 40-50% of total calorie needs, but carbohydrates are not restricted in this population.    5.  Meet sodium needs for CF  ===================================================================  INPATIENT:    Current Nutrition Orders (inpatient):       Nutrition Orders   (From admission, onward)             Start     Ordered    01/20/19 1842  Nutrition Therapy General (Regular)  Effective now     Comments:  Double portions   Question Answer Comment   Nutrition Therapy (T): General (Regular)    Other Restriction(s):  High protein/high calorie        01/20/19 1842              CF Nutrition related medications (inpatient): Nutritionally relevant medications reviewed.   Lantus 70 units nightly  HumaLOG  Multivitamin 2 per day  Creon 24,000 (12 at meals, 9 at snacks) provides 2461 units lipase/kg/meal & 57846 units lipase/kg/day  protonix Symdeko    CF Nutrition related labs (inpatient):  01-28-2019: reviewed  01-22-2019: Glu 159  01-21-2019: Glu 107   Recent Labs   Lab Units 01/28/19  0745 01/27/19  2046 01/27/19  1617 01/27/19  1212   POC GLUCOSE mg/dL 962 952* 92 841*     Last 5 Recorded Weights    01/20/19 1829   Weight: (!) 117.9 kg (260 lb)   ==================================================================  CLINICAL DATA:  Past Medical History:   Diagnosis Date   ??? Anxiety    ??? Chronic pain disorder    ??? Cystic fibrosis (CMS-HCC)    ??? Depression    ??? Hypertension    ??? Nonproductive cough 04/05/2018   - s/p RU lobectomy 03-29-17  - Per review of Care Everywhere PMHx also includes: CFRD, PI, essential HTN    Anthroprometric Evaluation:  Weight changes: Weight is down from February 2020.    BMI Readings from Last 1 Encounters:   01/20/19 35.26 kg/m??     Wt Readings from Last 12 Encounters:   01/20/19 (!) 117.9 kg (260 lb)   12/01/18 (!) 113.4 kg (250 lb)   11/21/18 (!) 118.2 kg (260 lb 9.6 oz)   10/01/18 (!) 122.5 kg (270 lb)   08/14/18 (!) 120.7 kg (266 lb 3.2 oz)   06/19/18 (!) 121.6 kg (268 lb)   06/19/18 (!) 122.6 kg (270 lb 4.8 oz)   04/09/18 (!) 120 kg (264 lb 8.8 oz)   03/17/18 (!) 120.2 kg (265 lb)   12/29/17 (!) 119.2 kg (262 lb 12.6 oz)   10/23/17 (!) 123.9 kg (273 lb 3.2 oz)   10/08/17 (!) 124 kg (273 lb 4.8 oz)     Ht Readings from Last 3 Encounters:   01/20/19 182.9 cm (6')   12/01/18 182.9 cm (6')   11/18/18 182.9 cm (6')   ==================================================================  Energy Intake (outpatient):  Diet: High in calories, fat, salt. Reports appetite/intake are decreased from baseline with current exacerbation. States he has been more aware of balancing meals, but has not made any big diet changes as discussed in February 2020 visit.  Food allergies: Banana  Diet and CFTR modulators: Prescribed Symdeko (tezacaftor/ivacaftor).    PO Supplements: none  Appetite Stimulant: none  Enteral feeding tube: n/a  Sodium in diet:  Adequate from diet  Calcium in diet: Adequate from diet     Other Labs: April 2020 - annual labs completed at [facility]. Total cholesterol and LDL cholesterol elevated. Cholesterol-lowering medical nutrition therapy information sent via MyChart.    Fat Malabsorption (outpatient):  Enzyme brand, (meals/snacks):  Creon 36,000 (7-8 at meals, 5-6 at snacks)   - Switched from Zenpep to Creon due to report of bloating & greasy stools when on Zenpep.   Enzyme administration details: correct pre-meal administration., swallows capsules whole   Enzyme dose per MEAL (units lipase/kg/meal) [phone]  Enzyme dose per DAY (units lipase/kg/day) 72536  Stools: 1-2 daily, not greasy  Abdominal pain: None reported  Fecal Fat Studies:    Pancreatic Elastase-1   Date Value Ref Range Status   03/19/2017 <15 (L) mcg/g Final  Comment:     Adult and Pediatric Reference Ranges for    Pancreatic Elastase-1:                  Normal:      >200 mcg/g  Moderate Pancreatic        Insufficiency:   100-200 mcg/g    Severe Pancreatic        Insufficiency:      <100 mcg/g     Elastase-1 (E-1) assay results are expressed  in mcg/g, which represent mcg E1/g feces.     It is not necessary to interrupt enzyme  substitution therapy.     Test Performed by:  [facility]  [address]  [place], [place] [address]     GI meds: Nutritionally relevant medications reviewed. Hx of MVW probiotic, omeprazole.    Vitamins/Minerals (outpatient):  CF-specific MVI, dose, compliance: MVW Complete Formulation Softgel D-5000 2 daily  Other vitamins/minerals/herbals: none  Calcium supplement: none  Patient Resources: -[employer] (phone [phone] [email or web address])   - Orders CF vitamin and probiotic directly from manufacturer (MVW), cost billed directly to [employer]  Fat-soluble vitamin levels:  Annual labs completed April 2020 at [facility]. Vitamin A, D and E all within normal limits.    Lab Results Component Value Date/Time    VITAMINA 44.4 11/19/2016 0647     Lab Results   Component Value Date/Time    VITDTOTAL 27.9 03/19/2017 0508        Lab Results   Component Value Date/Time    PT 17.6 (H) 11/18/2018 1555    PT 14.2 (H) 08/13/2018 0848    PT 12.9 (H) 08/19/2017 1340    PT 11.1 03/29/2017 0508    PT 11.5 03/28/2017 0519   August 2018: normal DesCarboxyPT (also known as PIVKA) indicates no vitamin K deficiency.      Bone Health: Last DEXA normal. (July 2018)     CF Related Diabetes: Yes, on insulin regimen.        Lab Results   Component Value Date/Time    A1C 8.5 (H) 08/12/2018 2024

## 2019-01-28 NOTE — Unmapped (Signed)
Pt continues to receive IV antibiotics for CF exacerbation via port. Remains w/o adverse reaction. Pt is ambulating around his room, watching TV/playing video games. Good appetite, consumed 100% of both breakfast & lunch. Denied pain. Will continue with POC.         Problem: Adult Inpatient Plan of Care  Goal: Plan of Care Review  Outcome: Progressing  Goal: Patient-Specific Goal (Individualization)  Outcome: Progressing  Goal: Absence of Hospital-Acquired Illness or Injury  Outcome: Progressing  Goal: Optimal Comfort and Wellbeing  Outcome: Progressing  Goal: Readiness for Transition of Care  Outcome: Progressing  Goal: Rounds/Family Conference  Outcome: Progressing     Problem: Infection  Goal: Infection Symptom Resolution  Outcome: Progressing     Problem: Fall Injury Risk  Goal: Absence of Fall and Fall-Related Injury  Outcome: Progressing     Problem: Diabetes Comorbidity  Goal: Blood Glucose Level Within Desired Range  Outcome: Progressing     Problem: Wound  Goal: Optimal Wound Healing  Outcome: Progressing

## 2019-01-28 NOTE — Unmapped (Signed)
PULMONARY PROGRESS  NOTE      Patient: Christian Young([birth year])  Length of stay:  LOS: 8 days     Assessment and Plan:      Principal Problem:    Cystic fibrosis with pulmonary exacerbation (CMS-HCC)  Active Problems:    Essential hypertension    Depressive disorder    Anxiety    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)    Chronic pansinusitis    Pancreatic insufficiency due to cystic fibrosis (CMS-HCC)    Bronchiectasis (CMS-HCC)    Chronic deep vein thrombosis (DVT) of lower extremity (CMS-HCC)    Obesity (BMI 30-39.9)    Restless leg syndrome  Resolved Problems:    * No resolved hospital problems. *    CF exacerbation w/ pulmonary manifestations w/ acute exacerbation of bronchiectasis:??Presented with increased cough and sputum production, SOB, as well as low grade fevers. CXR not ordered by ED. COVID pcr is negative 01/20/2019.??His last sputum culture 11/20/2018 showed: 1+ Mucoid Pseudomonas sensitive to Zosyn, Ceftaz, Aztreonam and 1+ Smooth Pseudomonas  intermediate sensitivity to Tobra and resistant to all else.   - still would like to get sample for sputum bacterial and AFB cultures (repeat, known MAC recently)  - IV tobramycin 240 mg q12h dosing by Pharmacy and IV Zosyn 4.5 g 6h to complete in house, total 12 days with discharge Sunday 6/21.  - Continue home??Symdeko, pt has home supply now here  - Pulmozyme 2.5 mg BID  - Pulmicort and brovana nebs BID to replace home symbicort  - HTS 7% QID w/ RT  - HTS 7% Aerobika and chest vest QID w/ RT  - montelukast 10 mg qHS  - cetirizine 10 mg daily  ??  History of Mycobacterium abscessus infection:??Required RULobectomy in August 2018.??Previously??on clofazimine and azithromycin. Plan to repeat AFB smears every 3 months (starting February 2020).??  - AFB smear reordered (new MAC found 11/2018, needs repeat culture before deciding on treatment), encouraged patient to save a sample today.   ??  CF-related PI  - HPHC diet  - Creon??7??capsules w/ meals, 4 w/ snacks  - MVW 2 capsules daily  ??  CF-related DM  Last A1c??8.5% (December)  - glargine 70 units qHS  - lispro??45 units TID AC (down from Carb counting dose of 60, increase as tolerated)  - SSI??  ??  Depression, anxiety  - Fluoxetine 40 mg daily  - Lamotrigine 100 mg daily    HTN  - Lisinopril 10 mg daily  ??  History of DVT  - Rivaroxaban 20 mg daily  ??  RLS  - Pramipexole 0.25 mg daily  - Elavil QHS   - Gabapentin TID  ??  Code??status:??full code    Please page [phone] with any questions.    Cecelia Byars, MD    Subjective:      History of Present Illness:  Christian Young is a 33 y.o. male admitted for CF exacerbation    No acute events overnight, continues to report improvement. Airway clearance going well. Still no sputum culture.    Review of Systems: A comprehensive review of systems was performed and was negative except as above in HPI  Past Medical History:   Diagnosis Date   ??? Anxiety    ??? Chronic pain disorder    ??? Cystic fibrosis (CMS-HCC)    ??? Depression    ??? Hypertension    ??? Nonproductive cough 04/05/2018     Past Surgical History:   Procedure Laterality Date   ???  PR REMOVAL OF LUNG,LOBECTOMY Right 03/29/2017    Procedure: REMOVAL OF LUNG, OTHER THAN PNEUMONECTOMY; SINGLE LOBE (LOBECTOMY);  Surgeon: Cherie Dark, MD;  Location: MAIN [facility];  Service: Thoracic     Medications reviewed in Epic  Allergies as of 01/20/2019 - Reviewed 01/20/2019   Allergen Reaction Noted   ??? [place] [aztreonam lysine] Anaphylaxis 12/27/2016   ??? Cefepime Itching and Nausea Only 09/06/2015   ??? Other Anaphylaxis and Other (See Comments) 09/06/2015   ??? Slo-bid 100 Anaphylaxis 06/20/2017   ??? Banana Itching and Nausea And Vomiting 07/29/2016   ??? Tobramycin Tinnitus 09/06/2015     Family History   Problem Relation Age of Onset   ??? Bipolar disorder Mother    ??? Depression Mother      Social History     Tobacco Use   ??? Smoking status: Never Smoker   ??? Smokeless tobacco: Never Used   Substance Use Topics   ??? Alcohol use: Yes Alcohol/week: 3.0 standard drinks     Types: 3 Glasses of wine per week     Comment: 3 bottles of wine 2 days ago        Objective:      Physical Exam:  Vitals:    01/27/19 0818 01/27/19 0826 01/27/19 2040 01/28/19 0839   BP: 129/63  134/79 129/78   Pulse: 86  94 87   Resp:   16 16   Temp:  36 ??C (96.8 ??F) 36.6 ??C (97.9 ??F) 36.3 ??C (97.3 ??F)   TempSrc:  Oral Oral Oral   SpO2: 96%  97% 96%   Weight:       Height:         General: Alert, well-appearing, and in no distress.  Eyes: Anicteric sclera, conjunctiva clear.  ENT:  Mucous membranes moist and intact.  Lymph: No cervical or supraclavicular adenopathy.  Lungs: Normal excursion, no dullness to percussion. Good air movement bilaterally, without wheezes or crackles today. Normal upper airway sounds without evidence of stridor.  Cardiovascular: Regular rate and rhythm, S1, S2 normal, no murmur, click, rub or gallop appreciated.  Abdomen: Soft, non-tender, not distended, bowel sounds are normal, liver is not enlarged, spleen is not enlarged  Musculoskeletal: No synovitis.  Skin: No rashes or lesions.  Neuro: No focal neurological deficits.    Malnutrition Assessment by RD:          Diagnostic Review:   All labs and images were personally reviewed.

## 2019-01-29 LAB — CBC
HEMATOCRIT: 44.3 % (ref 41.0–53.0)
MEAN CORPUSCULAR HEMOGLOBIN CONC: 31.1 g/dL (ref 31.0–37.0)
MEAN CORPUSCULAR HEMOGLOBIN: 27.4 pg (ref 26.0–34.0)
MEAN CORPUSCULAR VOLUME: 88.1 fL (ref 80.0–100.0)
MEAN PLATELET VOLUME: 6.9 fL — ABNORMAL LOW (ref 7.0–10.0)
PLATELET COUNT: 338 10*9/L (ref 150–440)
RED BLOOD CELL COUNT: 5.03 10*12/L (ref 4.50–5.90)
RED CELL DISTRIBUTION WIDTH: 15.4 % — ABNORMAL HIGH (ref 12.0–15.0)
WBC ADJUSTED: 7.9 10*9/L (ref 4.5–11.0)

## 2019-01-29 LAB — RED BLOOD CELL COUNT: Lab: 5.03

## 2019-01-29 LAB — COMPREHENSIVE METABOLIC PANEL
ALBUMIN: 4.8 g/dL (ref 3.5–5.0)
ALKALINE PHOSPHATASE: 74 U/L (ref 38–126)
ALT (SGPT): 71 U/L — ABNORMAL HIGH (ref <50)
ANION GAP: 10 mmol/L (ref 7–15)
AST (SGOT): 65 U/L — ABNORMAL HIGH (ref 19–55)
BILIRUBIN TOTAL: 0.4 mg/dL (ref 0.0–1.2)
BUN / CREAT RATIO: 14
CALCIUM: 9.5 mg/dL (ref 8.5–10.2)
CHLORIDE: 102 mmol/L (ref 98–107)
CO2: 26 mmol/L (ref 22.0–30.0)
CREATININE: 0.85 mg/dL (ref 0.70–1.30)
EGFR CKD-EPI AA MALE: >90 mL/min/{1.73_m2} (ref >=60)
EGFR CKD-EPI NON-AA MALE: >90 mL/min/{1.73_m2} (ref >=60)
GLUCOSE RANDOM: 97 mg/dL (ref 70–179)
POTASSIUM: 4.7 mmol/L (ref 3.5–5.0)
PROTEIN TOTAL: 7.7 g/dL (ref 6.5–8.3)
SODIUM: 138 mmol/L (ref 135–145)

## 2019-01-29 LAB — TOBRAMYCIN PEAK: Tobramycin^peak:MCnc:Pt:Ser/Plas:Qn:: 15.5

## 2019-01-29 LAB — MAGNESIUM: Magnesium:MCnc:Pt:Ser/Plas:Qn:: 1.9

## 2019-01-29 LAB — PROTIME-INR: PROTIME: 18.7 s — ABNORMAL HIGH (ref 10.2–13.1)

## 2019-01-29 LAB — PROTIME: Lab: 18.7 — ABNORMAL HIGH

## 2019-01-29 LAB — CO2: Carbon dioxide:SCnc:Pt:Ser/Plas:Qn:: 26

## 2019-01-29 NOTE — Unmapped (Signed)
POC reviewed with patient at start of shift. VSS and afebrile.  Pt reports chronic chest pain that is controlled with oxycodone.  Port dressing CDI.  IVF infusing at [facility].  Port slightly difficult to flush but this has been present prior to this admission.  IV abx administered per order. Will continue to monitor.  Problem: Adult Inpatient Plan of Care  Goal: Plan of Care Review  Outcome: Progressing  Goal: Patient-Specific Goal (Individualization)  Outcome: Progressing  Goal: Absence of Hospital-Acquired Illness or Injury  Outcome: Progressing  Goal: Optimal Comfort and Wellbeing  Outcome: Progressing  Goal: Readiness for Transition of Care  Outcome: Progressing  Goal: Rounds/Family Conference  Outcome: Progressing     Problem: Infection  Goal: Infection Symptom Resolution  Outcome: Progressing     Problem: Fall Injury Risk  Goal: Absence of Fall and Fall-Related Injury  Outcome: Progressing     Problem: Diabetes Comorbidity  Goal: Blood Glucose Level Within Desired Range  Outcome: Progressing     Problem: Wound  Goal: Optimal Wound Healing  Outcome: Progressing

## 2019-01-29 NOTE — Unmapped (Signed)
PULMONARY PROGRESS  NOTE      Patient: Christian Young(Mar 19, 1986)  Length of stay:  LOS: 9 days     Assessment and Plan:      Principal Problem:    Cystic fibrosis with pulmonary exacerbation (CMS-HCC)  Active Problems:    Essential hypertension    Depressive disorder    Anxiety    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)    Chronic pansinusitis    Pancreatic insufficiency due to cystic fibrosis (CMS-HCC)    Bronchiectasis (CMS-HCC)    Chronic deep vein thrombosis (DVT) of lower extremity (CMS-HCC)    Obesity (BMI 30-39.9)    Restless leg syndrome  Resolved Problems:    * No resolved hospital problems. *    CF exacerbation w/ pulmonary manifestations w/ acute exacerbation of bronchiectasis:??Presented with increased cough and sputum production, SOB, as well as low grade fevers. CXR not ordered by ED. COVID pcr is negative 01/20/2019.??His last sputum culture 11/20/2018 showed: 1+ Mucoid Pseudomonas sensitive to Zosyn, Ceftaz, Aztreonam and 1+ Smooth Pseudomonas  intermediate sensitivity to Tobra and resistant to all else.   - still would like to get sample for sputum bacterial and AFB cultures (repeat, known MAC recently)  - IV tobramycin 240 mg q12h dosing by Pharmacy and IV Zosyn 4.5 g 6h to complete in house, total 12 days with discharge Sunday 6/21.  - Continue home??Symdeko, pt has home supply now here  - Pulmozyme 2.5 mg BID  - Pulmicort and brovana nebs BID to replace home symbicort  - HTS 7% QID w/ RT  - HTS 7% Aerobika and chest vest QID w/ RT  - montelukast 10 mg qHS  - cetirizine 10 mg daily  ??  History of Mycobacterium abscessus infection:??Required RULobectomy in August 2018.??Previously??on clofazimine and azithromycin. Plan to repeat AFB smears every 3 months (starting February 2020).??  - AFB smear reordered (new MAC found 11/2018, needs repeat culture before deciding on treatment), encouraged patient to save a sample today.   ??  CF-related PI  - HPHC diet  - Creon??7??capsules w/ meals, 4 w/ snacks  - MVW 2 capsules daily plus additional vit K (aim 9-10K units/day of vit D); plan for discharge is MVW D5000 2 capsules daily.  ??  CF-related DM  Last A1c??8.5% (December)  - glargine 70 units qHS  - lispro??45 units TID AC (down from Carb counting dose of 60, increase as tolerated)  - SSI??  ??  Depression, anxiety  - Fluoxetine 40 mg daily  - Lamotrigine 100 mg daily    HTN  - Lisinopril 10 mg daily  ??  History of DVT  - Rivaroxaban 20 mg daily  ??  RLS  - Pramipexole 0.25 mg daily  - Elavil QHS   - Gabapentin TID    Abnormal LFTs  Overall stable with mild/borderline elevations in AST, ALT this admission, ?CF liver disease or NASH. Defer to outpatient care.    Dysfunctional port  Again not flushing or drawing; similar problem occurred last admission (that time flushing sluggishly but not drawing back, 2 mg tPA instilled but minimal improvement, discussed future port stripping, then as outpatient it started fully working again, and working fine this admission. Paged IR to see if there is something that we should do prior to discharge, otherwise will plan to use PIV to complete course and patient preferring to troubleshoot with IR as outpatient.  ??  Code??status:??full code    Please page [phone] with any questions.  Cecelia Byars, MD    Subjective:      History of Present Illness:  Christian Young is a 33 y.o. male admitted for CF exacerbation    No acute events overnight, continues to report improvement, doing well with clearance. This AM port would not draw or flush, deaccessed and reaccessed, no improvement. PIV placed.    Review of Systems: A comprehensive review of systems was performed and was negative except as above in HPI  Past Medical History:   Diagnosis Date   ??? Anxiety    ??? Chronic pain disorder    ??? Cystic fibrosis (CMS-HCC)    ??? Depression    ??? Hypertension    ??? Nonproductive cough 04/05/2018     Past Surgical History:   Procedure Laterality Date   ??? PR REMOVAL OF LUNG,LOBECTOMY Right 03/29/2017    Procedure: REMOVAL OF LUNG, OTHER THAN PNEUMONECTOMY; SINGLE LOBE (LOBECTOMY);  Surgeon: Cherie Dark, MD;  Location: MAIN OR [facility];  Service: Thoracic     Medications reviewed in Epic  Allergies as of 01/20/2019 - Reviewed 01/20/2019   Allergen Reaction Noted   ??? [place] [aztreonam lysine] Anaphylaxis 12/27/2016   ??? Cefepime Itching and Nausea Only 09/06/2015   ??? Other Anaphylaxis and Other (See Comments) 09/06/2015   ??? Slo-bid 100 Anaphylaxis 06/20/2017   ??? Banana Itching and Nausea And Vomiting 07/29/2016   ??? Tobramycin Tinnitus 09/06/2015     Family History   Problem Relation Age of Onset   ??? Bipolar disorder Mother    ??? Depression Mother      Social History     Tobacco Use   ??? Smoking status: Never Smoker   ??? Smokeless tobacco: Never Used   Substance Use Topics   ??? Alcohol use: Yes     Alcohol/week: 3.0 standard drinks     Types: 3 Glasses of wine per week     Comment: 3 bottles of wine 2 days ago        Objective:      Physical Exam:  Vitals:    01/28/19 0839 01/28/19 1927 01/28/19 2038 01/29/19 0837   BP: 129/78 128/72  125/73   Pulse: 87 98 92 85   Resp: 16 18 18 18    Temp: 36.3 ??C (97.3 ??F) 36.6 ??C (97.9 ??F)  36.4 ??C (97.5 ??F)   TempSrc: Oral Oral  Oral   SpO2: 96% 95% 96% 99%   Weight:       Height:         General: Alert, well-appearing, and in no distress.  Eyes: Anicteric sclera, conjunctiva clear.  ENT:  Mucous membranes moist and intact.  Lymph: No cervical or supraclavicular adenopathy.  Lungs: Normal excursion, no dullness to percussion. Good air movement bilaterally, few right apical crackles today, otherwise clear, no wheezes. Normal upper airway sounds without evidence of stridor.  Cardiovascular: Regular rate and rhythm, S1, S2 normal, no murmur, click, rub or gallop appreciated.  Abdomen: Soft, non-tender, not distended, bowel sounds are normal, liver is not enlarged, spleen is not enlarged  Musculoskeletal: No synovitis. Port now deaccessed, mild pain and erythema in this area from manipulation  Skin: No rashes or lesions.  Neuro: No focal neurological deficits.    Malnutrition Assessment by RD:          Diagnostic Review:   All labs and images were personally reviewed.

## 2019-01-30 LAB — TOBRAMYCIN RANDOM: Tobramycin:MCnc:Pt:Ser/Plas:Qn:: 1.3

## 2019-01-30 LAB — TOBRAMYCIN PEAK: Tobramycin^peak:MCnc:Pt:Ser/Plas:Qn:: 9.5

## 2019-01-30 NOTE — Unmapped (Signed)
Pleasant and cooperative with care. Remain independent in the room. No complaints. IV antibiotics given as per scheduled, no adverse reactions noted. Continue with plan of care.     Problem: Adult Inpatient Plan of Care  Goal: Plan of Care Review  Outcome: Progressing  Goal: Patient-Specific Goal (Individualization)  Outcome: Progressing  Goal: Absence of Hospital-Acquired Illness or Injury  Outcome: Progressing  Goal: Optimal Comfort and Wellbeing  Outcome: Progressing  Goal: Readiness for Transition of Care  Outcome: Progressing  Goal: Rounds/Family Conference  Outcome: Progressing     Problem: Infection  Goal: Infection Symptom Resolution  Outcome: Progressing     Problem: Fall Injury Risk  Goal: Absence of Fall and Fall-Related Injury  Outcome: Progressing     Problem: Diabetes Comorbidity  Goal: Blood Glucose Level Within Desired Range  Outcome: Progressing     Problem: Wound  Goal: Optimal Wound Healing  Outcome: Progressing

## 2019-01-30 NOTE — Unmapped (Signed)
POC reviewed with patient at start of shift. VSS and afebrile.  Pt reported 9/10 pain in right upper chest around port site.  MD notified and morphine ordered and administered.  Pts chronic chest pain is controlled with oxycodone.  Port is not accessed.  IVF infusing I nPIV at [facility].  IV abx administered per order. Will continue to monitor.  Problem: Adult Inpatient Plan of Care  Goal: Plan of Care Review  Outcome: Progressing  Goal: Patient-Specific Goal (Individualization)  Outcome: Progressing  Goal: Absence of Hospital-Acquired Illness or Injury  Outcome: Progressing  Goal: Optimal Comfort and Wellbeing  Outcome: Progressing  Goal: Readiness for Transition of Care  Outcome: Progressing  Goal: Rounds/Family Conference  Outcome: Progressing     Problem: Infection  Goal: Infection Symptom Resolution  Outcome: Progressing     Problem: Fall Injury Risk  Goal: Absence of Fall and Fall-Related Injury  Outcome: Progressing     Problem: Diabetes Comorbidity  Goal: Blood Glucose Level Within Desired Range  Outcome: Progressing     Problem: Wound  Goal: Optimal Wound Healing  Outcome: Progressing

## 2019-01-30 NOTE — Unmapped (Signed)
PULMONARY PROGRESS  NOTE      Patient: Christian Young([birth year])  Length of stay:  LOS: 10 days     Assessment and Plan:      Principal Problem:    Cystic fibrosis with pulmonary exacerbation (CMS-HCC)  Active Problems:    Essential hypertension    Depressive disorder    Anxiety    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)    Chronic pansinusitis    Pancreatic insufficiency due to cystic fibrosis (CMS-HCC)    Bronchiectasis (CMS-HCC)    Chronic deep vein thrombosis (DVT) of lower extremity (CMS-HCC)    Obesity (BMI 30-39.9)    Restless leg syndrome  Resolved Problems:    * No resolved hospital problems. *    CF exacerbation w/ pulmonary manifestations w/ acute exacerbation of bronchiectasis:??Presented with increased cough and sputum production, SOB, as well as low grade fevers. CXR not ordered by ED. COVID pcr is negative 01/20/2019.??His last sputum culture 11/20/2018 showed: 1+ Mucoid Pseudomonas sensitive to Zosyn, Ceftaz, Aztreonam and 1+ Smooth Pseudomonas  intermediate sensitivity to Tobra and resistant to all else.   - still would like to get sample for sputum bacterial and AFB cultures (repeat, known MAC recently)  - IV tobramycin 240 mg q12h dosing by Pharmacy and IV Zosyn 4.5 g 6h to complete in house, total 12 days with discharge Sunday 6/21.  - Continue home??Symdeko, pt has home supply now here  - Pulmozyme 2.5 mg BID  - Pulmicort and brovana nebs BID to replace home symbicort  - HTS 7% QID w/ RT  - HTS 7% Aerobika and chest vest QID w/ RT  - montelukast 10 mg qHS  - cetirizine 10 mg daily  ??  History of Mycobacterium abscessus infection:??Required RULobectomy in August 2018.??Previously??on clofazimine and azithromycin. Plan to repeat AFB smears every 3 months (starting February 2020).??  - AFB smear reordered (new MAC found 11/2018, needs repeat culture before deciding on treatment), encouraged patient to save a sample today.   ??  CF-related PI  - HPHC diet  - Creon??7??capsules w/ meals, 4 w/ snacks  - MVW 2 capsules daily plus additional vit K (aim 9-10K units/day of vit D); plan for discharge is MVW D5000 2 capsules daily.  ??  CF-related DM  Last A1c??8.5% (December)  - glargine 70 units qHS  - lispro??45 units TID AC (down from Carb counting dose of 60, increase as tolerated)  - SSI??  ??  Depression, anxiety  - Fluoxetine 40 mg daily  - Lamotrigine 100 mg daily    HTN  - Lisinopril 10 mg daily  ??  History of DVT  - Rivaroxaban 20 mg daily  ??  RLS  - Pramipexole 0.25 mg daily  - Elavil QHS   - Gabapentin TID    Abnormal LFTs  Overall stable with mild/borderline elevations in AST, ALT this admission, ?CF liver disease or NASH. Defer to outpatient care.    Dysfunctional port  Again not flushing or drawing; similar problem occurred last admission (that time flushing sluggishly but not drawing back, 2 mg tPA instilled but minimal improvement, discussed future port stripping, then as outpatient it started fully working again, and working fine this admission. Will plan to use PIV to complete course. Discussed with IR yesterday tPA 2 mg ordered to again attempt port clearance, needing attempts x2 before doing port stripping or replacement per IR.  ??  Code??status:??full code    Please page [phone] with any questions.    Britta Mccreedy  Myrtie Cruise, MD    Subjective:      History of Present Illness:  [name] is a 33 y.o. male admitted for CF exacerbation    No acute events overnight, continues to report improvement, doing well with clearance. This AM port would not draw or flush, deaccessed and reaccessed, no improvement. PIV placed.    Review of Systems: A comprehensive review of systems was performed and was negative except as above in HPI  Past Medical History:   Diagnosis Date   ??? Anxiety    ??? Chronic pain disorder    ??? Cystic fibrosis (CMS-HCC)    ??? Depression    ??? Hypertension    ??? Nonproductive cough 04/05/2018     Past Surgical History:   Procedure Laterality Date   ??? PR REMOVAL OF LUNG,LOBECTOMY Right 03/29/2017    Procedure: REMOVAL OF LUNG, OTHER THAN PNEUMONECTOMY; SINGLE LOBE (LOBECTOMY);  Surgeon: Cherie Dark, MD;  Location: MAIN OR [facility];  Service: Thoracic     Medications reviewed in Epic  Allergies as of 01/20/2019 - Reviewed 01/20/2019   Allergen Reaction Noted   ??? [place] [aztreonam lysine] Anaphylaxis 12/27/2016   ??? Cefepime Itching and Nausea Only 09/06/2015   ??? Other Anaphylaxis and Other (See Comments) 09/06/2015   ??? Slo-bid 100 Anaphylaxis 06/20/2017   ??? Banana Itching and Nausea And Vomiting 07/29/2016   ??? Tobramycin Tinnitus 09/06/2015     Family History   Problem Relation Age of Onset   ??? Bipolar disorder Mother    ??? Depression Mother      Social History     Tobacco Use   ??? Smoking status: Never Smoker   ??? Smokeless tobacco: Never Used   Substance Use Topics   ??? Alcohol use: Yes     Alcohol/week: 3.0 standard drinks     Types: 3 Glasses of wine per week     Comment: 3 bottles of wine 2 days ago        Objective:      Physical Exam:  Vitals:    01/29/19 0837 01/29/19 1924 01/29/19 2039 01/30/19 1106   BP: 125/73 134/86  136/77   Pulse: 85 95 96 100   Resp: 18 18 18 18    Temp: 36.4 ??C (97.5 ??F) 36.4 ??C (97.5 ??F)  36.6 ??C (97.9 ??F)   TempSrc: Oral Oral  Oral   SpO2: 99% 97% 97% 95%   Weight:       Height:         General: Alert, well-appearing, and in no distress.  Eyes: Anicteric sclera, conjunctiva clear.  ENT:  Mucous membranes moist and intact.  Lymph: No cervical or supraclavicular adenopathy.  Lungs: Normal excursion, no dullness to percussion. Good air movement bilaterally, no crackles, no wheezes today. Normal upper airway sounds without evidence of stridor.  Cardiovascular: Regular rate and rhythm, S1, S2 normal, no murmur, click, rub or gallop appreciated.  Abdomen: Soft, non-tender, not distended, bowel sounds are normal, liver is not enlarged, spleen is not enlarged  Musculoskeletal: No synovitis. Port site benign.  Skin: No rashes or lesions.   Neuro: No focal neurological deficits.    Malnutrition Assessment by RD:          Diagnostic Review:   All labs and images were personally reviewed.

## 2019-01-30 NOTE — Unmapped (Signed)
Port tPA infusion note    Patient with port placed 2017, some dysfunction during admission 11/2018 (sluggish flushing, no draw back), treated with tPA instillation at that time, did not change situation acutely but in interim port became fully functional again. This admission port was working fine for infusion and draw back until yesterday, no longer flushing or drawing back despite reaccess attempts. Discussed with VIR yesterday, recommended again attempting tPA instillation, with recommendations for 3 way stopcock/negative pressure technique, which Christian Young is amenable to today.    Port accessed by patient's RN, still unable to flush or draw back. 3 way stopcock with tPA 2 mg (1 mg/ml) attached, additional syringe used to create negative pressure to instill all 2 ml of medication. Completion time 13:45, will attempt flushing again after dwell time.    Cecelia Byars, MD

## 2019-01-31 NOTE — Unmapped (Signed)
Aminoglycoside Therapeutic Monitoring Pharmacy Note    Christian Young is a 33 y.o. male continuing tobramycin. Date of therapy initiation: 01/20/19    Indication: CF exacerbation    Prior Dosing Information: Current regimen 240 mg IV q12h infused over 30 minutes      Goals:  Therapeutic Drug Levels  ?? Trough level: tobramycin <1 mg/L  ?? Peak level: tobramycin 10-14 mg/L    Additional Clinical Monitoring/Outcomes  Renal function, volume status (intake and output)    Results:   ?? Random level: 1.3 mg/L, drawn 1750 (Extrapolated trough: 0.3 mg/L)   ?? Peak level: 9.5 mg/L, drawn 1140 (Extrapolated Peak: 14.1 mg/L)    Wt Readings from Last 1 Encounters:   01/20/19 (!) 117.9 kg (260 lb)     Lab Results   Component Value Date    CREATININE 0.85 01/29/2019       Pharmacokinetic Considerations and Significant Drug Interactions:  ? Adult (calculated on 01/30/19): Vd = 16.1 L, ke = 0.323 hr-1  ? Concurrent nephrotoxic meds: Zosyn    Assessment/Plan:  Recommendation(s)  ? Change current regimen to 200 mg IV q12h infused over 30 minutes  ? Estimated peak and trough on recommended regimen: peak = 11.7 mg/L, trough = 0.3 mg/L    Follow-up  ? Level due: peak and trough levels around the third or fourth dose  ? A pharmacist will continue to monitor and order levels as appropriate    Please page service pharmacist with questions/clarifications.    Colin Mulders, PharmD

## 2019-01-31 NOTE — Unmapped (Signed)
Pleasant and cooperative with care. Remain independent in the room. Pt looks slightly frustrated with his porta cath for not functioning properly. Morphine given iv for c/o pain at the site after accessing the port. Tolerating IV antibiotics without adverse effects noted.  Continue with plan of care.

## 2019-01-31 NOTE — Unmapped (Signed)
All scheduled breathing treatments done. Airway clearance done with chest vest. Stable on room air

## 2019-01-31 NOTE — Unmapped (Signed)
PULMONARY PROGRESS  NOTE      Patient: Christian Young(1986/02/17)  Length of stay:  LOS: 11 days     Assessment and Plan:      Principal Problem:    Cystic fibrosis with pulmonary exacerbation (CMS-HCC)  Active Problems:    Essential hypertension    Depressive disorder    Anxiety    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)    Chronic pansinusitis    Pancreatic insufficiency due to cystic fibrosis (CMS-HCC)    Bronchiectasis (CMS-HCC)    Chronic deep vein thrombosis (DVT) of lower extremity (CMS-HCC)    Obesity (BMI 30-39.9)    Restless leg syndrome  Resolved Problems:    * No resolved hospital problems. *    CF exacerbation w/ pulmonary manifestations w/ acute exacerbation of bronchiectasis:??Presented with increased cough and sputum production, SOB, as well as low grade fevers. CXR not ordered by ED. COVID pcr is negative 01/20/2019.??His last sputum culture 11/20/2018 showed: 1+ Mucoid Pseudomonas sensitive to Zosyn, Ceftaz, Aztreonam and 1+ Smooth Pseudomonas  intermediate sensitivity to Tobra and resistant to all else.   - still would like to get sample for sputum bacterial and AFB cultures (repeat, known MAC recently)  - IV tobramycin 240 mg q12h dosing by Pharmacy and IV Zosyn 4.5 g 6h to complete in house, total 12 days with discharge Sunday 6/21.  - Continue home??Symdeko, pt has home supply now here  - Pulmozyme 2.5 mg BID  - Pulmicort and brovana nebs BID to replace home symbicort  - HTS 7% QID w/ RT  - HTS 7% Aerobika and chest vest QID w/ RT  - montelukast 10 mg qHS  - cetirizine 10 mg daily  ??  History of Mycobacterium abscessus infection:??Required RULobectomy in August 2018.??Previously??on clofazimine and azithromycin. Plan to repeat AFB smears every 3 months (starting February 2020).??  - AFB smear reordered (new MAC found 11/2018, needs repeat culture before deciding on treatment)  ??  CF-related PI  - HPHC diet  - Creon??7??capsules w/ meals, 4 w/ snacks  - MVW 2 capsules daily plus additional vit K (aim 9-10K units/day of vit D); plan for discharge is MVW D5000 2 capsules daily.  ??  CF-related DM  Last A1c??8.5% (December)  - glargine 70 units qHS  - lispro??45 units TID AC (down from Carb counting dose of 60, increase as tolerated)  - SSI??  ??  Depression, anxiety  - Fluoxetine 40 mg daily  - Lamotrigine 100 mg daily    HTN  - Lisinopril 10 mg daily  ??  History of DVT  - Rivaroxaban 20 mg daily  ??  RLS  - Pramipexole 0.25 mg daily  - Elavil QHS   - Gabapentin TID    Abnormal LFTs  Overall stable with mild/borderline elevations in AST, ALT this admission, ?CF liver disease or NASH. Defer to outpatient care.    Dysfunctional port  Again not flushing or drawing; similar problem occurred last admission (that time flushing sluggishly but not drawing back, 2 mg tPA instilled but minimal improvement, discussed future port stripping, then as outpatient it started fully working again, and working fine this admission. Will plan to use PIV to complete course. Discussed with IR yesterday tPA 2 mg ordered to again attempt port clearance, needing attempts x2 before doing port stripping or replacement per IR - will need follow up on this as an outpatient  ??  Code??status:??full code    Please page [phone] with any questions.  Verne Grain, MD    Subjective:      History of Present Illness:  Christian Young is a 33 y.o. male admitted for CF exacerbation    No acute events overnight, feeling well this morning.    Review of Systems: A comprehensive review of systems was performed and was negative except as above in HPI  Past Medical History:   Diagnosis Date   ??? Anxiety    ??? Chronic pain disorder    ??? Cystic fibrosis (CMS-HCC)    ??? Depression    ??? Hypertension    ??? Nonproductive cough 04/05/2018     Past Surgical History:   Procedure Laterality Date   ??? PR REMOVAL OF LUNG,LOBECTOMY Right 03/29/2017    Procedure: REMOVAL OF LUNG, OTHER THAN PNEUMONECTOMY; SINGLE LOBE (LOBECTOMY);  Surgeon: Cherie Dark, MD;  Location: MAIN OR [facility];  Service: Thoracic     Medications reviewed in Epic  Allergies as of 01/20/2019 - Reviewed 01/20/2019   Allergen Reaction Noted   ??? [place] [aztreonam lysine] Anaphylaxis 12/27/2016   ??? Cefepime Itching and Nausea Only 09/06/2015   ??? Other Anaphylaxis and Other (See Comments) 09/06/2015   ??? Slo-bid 100 Anaphylaxis 06/20/2017   ??? Banana Itching and Nausea And Vomiting 07/29/2016   ??? Tobramycin Tinnitus 09/06/2015     Family History   Problem Relation Age of Onset   ??? Bipolar disorder Mother    ??? Depression Mother      Social History     Tobacco Use   ??? Smoking status: Never Smoker   ??? Smokeless tobacco: Never Used   Substance Use Topics   ??? Alcohol use: Yes     Alcohol/week: 3.0 standard drinks     Types: 3 Glasses of wine per week     Comment: 3 bottles of wine 2 days ago        Objective:      Physical Exam:  Vitals:    01/29/19 2039 01/30/19 1106 01/30/19 2015 01/30/19 2121   BP:  136/77 115/61    Pulse: 96 100 80 103   Resp: 18 18 18 18    Temp:  36.6 ??C (97.9 ??F) 36.2 ??C (97.2 ??F)    TempSrc:  Oral Oral    SpO2: 97% 95% 96% 97%   Weight:       Height:         General: Alert, well-appearing, and in no distress.  Eyes: Anicteric sclera, conjunctiva clear.  ENT:  Mucous membranes moist and intact.  Lymph: No cervical or supraclavicular adenopathy.  Lungs: Normal excursion, no dullness to percussion. Good air movement bilaterally, no crackles, no wheezes today. Normal upper airway sounds without evidence of stridor.  Cardiovascular: Regular rate and rhythm, S1, S2 normal, no murmur, click, rub or gallop appreciated.  Abdomen: Soft, non-tender, not distended, bowel sounds are normal, liver is not enlarged, spleen is not enlarged  Musculoskeletal: No synovitis. Port site benign.  Skin: No rashes or lesions.   Neuro: No focal neurological deficits.    Malnutrition Assessment by RD:          Diagnostic Review:   All labs and images were personally reviewed.

## 2019-01-31 NOTE — Unmapped (Signed)
Pt alert and cooperative. Pt oriented x4. Pt experiencing some pain, requested order of morphine and patient did receive. Pt compliant with medications/continued antibiotics. Rounds/safety maintained. Will continue to monitor.  Problem: Adult Inpatient Plan of Care  Goal: Plan of Care Review  Outcome: Progressing  Goal: Patient-Specific Goal (Individualization)  Outcome: Progressing  Goal: Absence of Hospital-Acquired Illness or Injury  Outcome: Progressing  Goal: Optimal Comfort and Wellbeing  Outcome: Progressing  Goal: Readiness for Transition of Care  Outcome: Progressing  Goal: Rounds/Family Conference  Outcome: Progressing     Problem: Infection  Goal: Infection Symptom Resolution  Outcome: Progressing     Problem: Fall Injury Risk  Goal: Absence of Fall and Fall-Related Injury  Outcome: Progressing     Problem: Diabetes Comorbidity  Goal: Blood Glucose Level Within Desired Range  Outcome: Progressing     Problem: Wound  Goal: Optimal Wound Healing  Outcome: Progressing

## 2019-02-01 LAB — TOBRAMYCIN PEAK: Tobramycin^peak:MCnc:Pt:Ser/Plas:Qn:: 8.3

## 2019-02-01 MED ORDER — LISINOPRIL 10 MG TABLET
ORAL_TABLET | Freq: Every day | ORAL | 0 refills | 0.00000 days
Start: 2019-02-01 — End: ?

## 2019-02-01 MED ORDER — DORNASE ALFA 1 MG/ML SOLUTION FOR INHALATION
Freq: Every day | RESPIRATORY_TRACT | 3 refills | 0 days | Status: CP
Start: 2019-02-01 — End: 2020-02-01

## 2019-02-01 MED ORDER — TRAZODONE 100 MG TABLET
ORAL_TABLET | Freq: Every evening | ORAL | 0 refills | 0.00000 days | Status: CP
Start: 2019-02-01 — End: ?

## 2019-02-01 MED ORDER — CETIRIZINE 10 MG TABLET
ORAL_TABLET | 3 refills | 0 days | Status: CP
Start: 2019-02-01 — End: 2020-02-01

## 2019-02-01 NOTE — Unmapped (Signed)
Pt has been free from falls throughout the shift.  No c/o pain/N/V/D.  Pt ready for d/c  Problem: Adult Inpatient Plan of Care  Goal: Plan of Care Review  Outcome: Resolved  Goal: Patient-Specific Goal (Individualization)  Outcome: Resolved  Goal: Absence of Hospital-Acquired Illness or Injury  Outcome: Resolved  Goal: Optimal Comfort and Wellbeing  Outcome: Resolved  Goal: Readiness for Transition of Care  Outcome: Resolved  Goal: Rounds/Family Conference  Outcome: Resolved     Problem: Infection  Goal: Infection Symptom Resolution  Outcome: Resolved     Problem: Fall Injury Risk  Goal: Absence of Fall and Fall-Related Injury  Outcome: Resolved     Problem: Diabetes Comorbidity  Goal: Blood Glucose Level Within Desired Range  Outcome: Resolved     Problem: Wound  Goal: Optimal Wound Healing  Outcome: Resolved

## 2019-02-01 NOTE — Unmapped (Signed)
Pt is doing well. No complaints all day. With good appetite. IV antibiotics ongoing, without adverse effects noted. Continue with plan of care.     Problem: Adult Inpatient Plan of Care  Goal: Plan of Care Review  Outcome: Progressing  Goal: Patient-Specific Goal (Individualization)  Outcome: Progressing  Goal: Absence of Hospital-Acquired Illness or Injury  Outcome: Progressing  Goal: Optimal Comfort and Wellbeing  Outcome: Progressing  Goal: Readiness for Transition of Care  Outcome: Progressing  Goal: Rounds/Family Conference  Outcome: Progressing     Problem: Infection  Goal: Infection Symptom Resolution  Outcome: Progressing     Problem: Fall Injury Risk  Goal: Absence of Fall and Fall-Related Injury  Outcome: Progressing     Problem: Diabetes Comorbidity  Goal: Blood Glucose Level Within Desired Range  Outcome: Progressing     Problem: Wound  Goal: Optimal Wound Healing  Outcome: Progressing

## 2019-02-01 NOTE — Unmapped (Signed)
Physician Discharge Summary    Admit date: 01/20/2019    Discharge date and time: 02/01/19    Discharge to: Home    Discharge Service: Pulmonary Service    Discharge Attending Physician: Marnette Burgess A. Aksh Swart  Discharge Diagnoses: Cystic Fibrosis with pulmonary exacerbation    Procedures: None    Pertinent Test Results: None    Hospital Course:  Christian Young is a 33 year old gentleman with a past medical history including HTN, MDD, DVT on AC, M. Abscessus s/p RUL lobectomy and cystic fibrosis who was admitted on 01/20/19 with a SOB and increased cough and sputum production consistent with a CF exacerbation. Given his prior sputum cultures that typically grow out Psuedomonas, he was started on IV Zosyn and IV Tobramycin, which he completed a total 12 day course on 02/01/19. During his admission, he remained on his home airway clearance regimen. We attempted to obtain repeat sputum and AFB cultures but were were not able to collect a sample. He reported continued improvement in his respiratory symptoms and was discharged two days early to get home for Father's Day.     Condition at Discharge: good  Discharge Medications:      Your Medication List      STOP taking these medications    MVW COMPLETE FORMULATION D5000 5,000-800 unit-mcg Cap  Generic drug:  pediatric multivit 61-D3-vit K  Replaced by:  MVW Complete (pediatric multivit 61-D3-vit K) 1,500-800 unit-mcg Cap        START taking these medications    MVW Complete (pediatric multivit 61-D3-vit K) 1,500-800 unit-mcg Cap  Take 2 capsules by mouth daily.  Start taking on:  February 02, 2019  Replaces:  MVW COMPLETE FORMULATION D5000 5,000-800 unit-mcg Cap        CHANGE how you take these medications    dornase alfa 1 mg/mL nebulizer solution  Commonly known as:  PULMOZYME  Inhale 2.5 mg daily.  What changed:  when to take this     insulin ASPART 100 unit/mL (3 mL) injection pen  Commonly known as:  NovoLOG FLEXPEN  Use up to 150 units/day, divided TID AC meals  What changed: additional instructions     insulin glargine 100 unit/mL (3 mL) injection pen  Commonly known as:  BASAGLAR, LANTUS  Inject 0.7 mL (70 Units total) under the skin daily.  What changed:  when to take this     sodium chloride 7% 7 % Nebu  Inhale 4 mL by nebulization Two (2) times a day. Increase to 4 times while taking antibiotics  What changed:  additional instructions        CONTINUE taking these medications    albuterol 90 mcg/actuation inhaler  Commonly known as:  PROVENTIL HFA;VENTOLIN HFA  Inhale 2 puffs every six (6) hours as needed.     albuterol 2.5 mg/0.5 mL nebulizer solution  Inhale 0.5 mL (2.5 mg total) by nebulization every six (6) hours as needed for wheezing.     amitriptyline 25 MG tablet  Commonly known as:  ELAVIL  Take 25 mg by mouth nightly.     BETHKIS 300 mg/4 mL Nebu  Generic drug:  tobramycin  Inhale 300 mg every twelve (12) hours. For 28 days, alternating every other 28 days     blood sugar diagnostic Strp  Dispense 150 blood glucose test strips, ok to sub any brand preferred by insurance/patient, use up to 5x/day, dx E 08.9     blood-glucose meter kit  Dispense meter that is preferred  by patient's insurance company     blood-glucose meter,continuous Misc  Commonly known as:  DEXCOM G6 RECEIVER  Use as directed     blood-glucose sensor Devi  Commonly known as:  DEXCOM G6 SENSOR  Use sq as directed every 14 days     blood-glucose transmitter Devi  Commonly known as:  DEXCOM G6 TRANSMITTER  Use as directed     budesonide-formoteroL 160-4.5 mcg/actuation inhaler  Commonly known as:  SYMBICORT  Inhale 2 puffs Two (2) times a day.     cetirizine 10 MG tablet  Commonly known as:  ZyrTEC  TAKE 1 TABLET (10 MG TOTAL) BY MOUTH DAILY.     diclofenac sodium 1 % gel  Commonly known as:  VOLTAREN  Apply 2 g topically Four (4) times a day.     EASY TOUCH LANCING DEVICE Misc  Generic drug:  lancing device  Use as directed.     EASY TOUCH TWIST LANCETS 30 gauge Misc  Generic drug:  lancets  Use as directed. FLUoxetine 20 MG capsule  Commonly known as:  PROzac  Take 40 mg by mouth daily.     glucagon (human recombinant) 1 mg/ml injection  Commonly known as:  glucagon  Inject 1 mL (1 mg total) under the skin once as needed (severe hypoglycemia). Follow package directions for low blood sugar.     HORIZANT 600 mg Tber  Generic drug:  gabapentin enacarbil  Take 1,200 mg by mouth nightly.     lamoTRIgine 200 MG tablet  Commonly known as:  LaMICtal  Take 200 mg by mouth daily.     LC PLUS Misc  Generic drug:  nebulizers  Use as directed with inhaled medications     lidocaine-prilocaine 2.5-2.5 % cream  Commonly known as:  EMLA  Apply topically every twelve (12) hours.     lipase-protease-amylase 36,000-114,000- 180,000 unit Cpdr  Commonly known as:  CREON  TAKE 7-8 CAPSULES BY MOUTH WITH MEALS AND 5-6 CAPSULES WITH SNACKS     lisinopriL 10 MG tablet  Commonly known as:  PRINIVIL,ZESTRIL  Take 1 tablet (10 mg total) by mouth daily.     melatonin 10 mg Tab  Take 10 mg by mouth nightly.     montelukast 10 mg tablet  Commonly known as:  SINGULAIR  Take 1 tablet (10 mg total) by mouth nightly.     MVW COMPLETE FORMUL PROBIOTIC 40 billion cell -15 mg Cpdr  Generic drug:  Lacto-Bif-Sac-Bacil-Strep-bact  Take 1 capsule by mouth daily.     omeprazole 20 MG capsule  Commonly known as:  PriLOSEC  Take 1 capsule (20 mg total) by mouth daily.     pen needle, diabetic 32 gauge x 5/32 (4 mm) Ndle  Commonly known as:  BD ULTRA-FINE NANO PEN NEEDLE  ok to sub any brand or size needle preferred by insurance/patient, use up to 4x/day, dx E 08.9     pramipexole 0.125 MG tablet  Commonly known as:  MIRAPEX  Take 0.25 mg by mouth daily.     SYMDEKO 100-150 mg (d)/ 150 mg (n) tablet  Generic drug:  tezacaftor-ivacaftor  TAKE BY MOUTH AS DIRECTED ON PACKAGE LABELING     traZODone 100 MG tablet  Commonly known as:  DESYREL  Take 2 tablets (200 mg total) by mouth nightly.     XARELTO 20 mg tablet  Generic drug:  rivaroxaban  Take 20 mg by mouth daily.            Pending  Test Results:       Discharge Instructions:       Appointments which have been scheduled for you    Feb 16, 2019  2:30 PM EDT  (Arrive by 2:15 PM)  VIDEO VISIT- OTHER with Harrie Foreman, MD  [facility] [place] [facility]) [address]  [address]  [place] [address]  [phone]              I spent less than 30 minutes in the discharge of this patient.

## 2019-02-01 NOTE — Unmapped (Signed)
PULMONARY PROGRESS  NOTE      Patient: Christian Young(February 05, 1986)  Length of stay:  LOS: 12 days     Assessment and Plan:      Principal Problem:    Cystic fibrosis with pulmonary exacerbation (CMS-HCC)  Active Problems:    Essential hypertension    Depressive disorder    Anxiety    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)    Chronic pansinusitis    Pancreatic insufficiency due to cystic fibrosis (CMS-HCC)    Bronchiectasis (CMS-HCC)    Chronic deep vein thrombosis (DVT) of lower extremity (CMS-HCC)    Obesity (BMI 30-39.9)    Restless leg syndrome  Resolved Problems:    * No resolved hospital problems. *    CF exacerbation w/ pulmonary manifestations w/ acute exacerbation of bronchiectasis:??Presented with increased cough and sputum production, SOB, as well as low grade fevers. CXR not ordered by ED. COVID pcr is negative 01/20/2019.??His last sputum culture 11/20/2018 showed: 1+ Mucoid Pseudomonas sensitive to Zosyn, Ceftaz, Aztreonam and 1+ Smooth Pseudomonas  intermediate sensitivity to Tobra and resistant to all else.   - IV tobramycin 240 mg q12h dosing by Pharmacy and IV Zosyn 4.5 g 6h to complete in house, total 12 days - completed today  - Continue home??Symdeko, pt has home supply now here  - Pulmozyme 2.5 mg BID  - Pulmicort and brovana nebs BID to replace home symbicort  - HTS 7% QID w/ RT  - HTS 7% Aerobika and chest vest QID w/ RT  - montelukast 10 mg qHS  - cetirizine 10 mg daily  ??  History of Mycobacterium abscessus infection:??Required RULobectomy in August 2018.??Previously??on clofazimine and azithromycin. Plan to repeat AFB smears every 3 months (starting February 2020).??  - AFB smear reordered (new MAC found 11/2018, needs repeat culture before deciding on treatment)  ??  CF-related PI  - HPHC diet  - Creon??7??capsules w/ meals, 4 w/ snacks  - MVW 2 capsules daily plus additional vit K (aim 9-10K units/day of vit D); plan for discharge is MVW D5000 2 capsules daily.  ??  CF-related DM Last A1c??8.5% (December)  - glargine 70 units qHS  - lispro??45 units TID AC (down from Carb counting dose of 60, increase as tolerated)  - SSI??  ??  Depression, anxiety  - Fluoxetine 40 mg daily  - Lamotrigine 100 mg daily    HTN  - Lisinopril 10 mg daily  ??  History of DVT  - Rivaroxaban 20 mg daily  ??  RLS  - Pramipexole 0.25 mg daily  - Elavil QHS   - Gabapentin TID    Abnormal LFTs  Overall stable with mild/borderline elevations in AST, ALT this admission, ?CF liver disease or NASH. Defer to outpatient care.    Dysfunctional port  Again not flushing or drawing; similar problem occurred last admission (that time flushing sluggishly but not drawing back, 2 mg tPA instilled but minimal improvement, discussed future port stripping, then as outpatient it started fully working again, and working fine this admission. Will plan to use PIV to complete course. Discussed with IR yesterday tPA 2 mg ordered to again attempt port clearance, needing attempts x2 before doing port stripping or replacement per IR - will need follow up on this as an outpatient  ??  Code??status:??full code    Please page [phone] with any questions.    Verne Grain, MD    Subjective:      History of Present Illness:  Mr.  Christian Young is a 33 y.o. male admitted for CF exacerbation    No acute events overnight, feeling well this morning. Ready for discharge.     Review of Systems: A comprehensive review of systems was performed and was negative except as above in HPI  Past Medical History:   Diagnosis Date   ??? Anxiety    ??? Chronic pain disorder    ??? Cystic fibrosis (CMS-HCC)    ??? Depression    ??? Hypertension    ??? Nonproductive cough 04/05/2018     Past Surgical History:   Procedure Laterality Date   ??? PR REMOVAL OF LUNG,LOBECTOMY Right 03/29/2017    Procedure: REMOVAL OF LUNG, OTHER THAN PNEUMONECTOMY; SINGLE LOBE (LOBECTOMY);  Surgeon: Cherie Dark, MD;  Location: MAIN OR [facility];  Service: Thoracic     Medications reviewed in Epic  Allergies as of 01/20/2019 - Reviewed 01/20/2019   Allergen Reaction Noted   ??? [place] [aztreonam lysine] Anaphylaxis 12/27/2016   ??? Cefepime Itching and Nausea Only 09/06/2015   ??? Other Anaphylaxis and Other (See Comments) 09/06/2015   ??? Slo-bid 100 Anaphylaxis 06/20/2017   ??? Banana Itching and Nausea And Vomiting 07/29/2016   ??? Tobramycin Tinnitus 09/06/2015     Family History   Problem Relation Age of Onset   ??? Bipolar disorder Mother    ??? Depression Mother      Social History     Tobacco Use   ??? Smoking status: Never Smoker   ??? Smokeless tobacco: Never Used   Substance Use Topics   ??? Alcohol use: Yes     Alcohol/week: 3.0 standard drinks     Types: 3 Glasses of wine per week     Comment: 3 bottles of wine 2 days ago        Objective:      Physical Exam:  Vitals:    01/30/19 2015 01/30/19 2121 01/31/19 1944 01/31/19 2120   BP: 115/61  113/67    Pulse: 80 103 101 104   Resp: 18 18 18 18    Temp: 36.2 ??C (97.2 ??F)  36.3 ??C (97.3 ??F)    TempSrc: Oral  Oral    SpO2: 96% 97% 98% 98%   Weight:       Height:         General: Alert, well-appearing, and in no distress.  Eyes: Anicteric sclera, conjunctiva clear.  ENT:  Mucous membranes moist and intact.  Lymph: No cervical or supraclavicular adenopathy.  Lungs: Normal excursion, no dullness to percussion. Good air movement bilaterally, no crackles, no wheezes today. Normal upper airway sounds without evidence of stridor.  Cardiovascular: Regular rate and rhythm, S1, S2 normal, no murmur, click, rub or gallop appreciated.  Abdomen: Soft, non-tender, not distended, bowel sounds are normal, liver is not enlarged, spleen is not enlarged  Musculoskeletal: No synovitis. Port site benign.  Skin: No rashes or lesions.   Neuro: No focal neurological deficits.    Malnutrition Assessment by RD:          Diagnostic Review:   All labs and images were personally reviewed.

## 2019-02-01 NOTE — Unmapped (Signed)
Christian Young is a 33 year old gentleman with a past medical history including HTN, MDD, DVT on AC, M. Abscessus s/p RUL lobectomy and cystic fibrosis who was admitted on 01/20/19 with a SOB and increased cough and sputum production consistent with a CF exacerbation. Given his prior sputum cultures that typically grow out Psuedomonas, he was started on IV Zosyn and IV Tobramycin, which he completed a total 12 day course on 02/01/19. During his admission, he remained on his home airway clearance regimen. We attempted to obtain repeat sputum and AFB cultures but were were not able to collect a sample. He reported continued improvement in his respiratory symptoms and was discharged two days early to get home for Father's Day.

## 2019-02-01 NOTE — Unmapped (Signed)
POC discussed with patient.  IV antibiotics given per orders.  No falls noted during my shift thus far and I will continue to monitor patient.  Plan for d/c home today.    Problem: Adult Inpatient Plan of Care  Goal: Plan of Care Review  Outcome: Progressing  Goal: Patient-Specific Goal (Individualization)  Outcome: Progressing  Goal: Absence of Hospital-Acquired Illness or Injury  Outcome: Progressing  Goal: Optimal Comfort and Wellbeing  Outcome: Progressing  Goal: Readiness for Transition of Care  Outcome: Progressing  Goal: Rounds/Family Conference  Outcome: Progressing     Problem: Infection  Goal: Infection Symptom Resolution  Outcome: Progressing     Problem: Fall Injury Risk  Goal: Absence of Fall and Fall-Related Injury  Outcome: Progressing     Problem: Diabetes Comorbidity  Goal: Blood Glucose Level Within Desired Range  Outcome: Progressing     Problem: Wound  Goal: Optimal Wound Healing  Outcome: Progressing

## 2019-02-02 MED ORDER — PEDIATRIC MULTIVITAMIN NO.61-VIT D3 1,500 UNIT-VIT K 800 MCG CAPSULE
ORAL_CAPSULE | Freq: Every day | ORAL | 11 refills | 0 days
Start: 2019-02-02 — End: 2020-02-02

## 2019-02-03 NOTE — Unmapped (Signed)
Forms were faxed to: [facility]  Fax# [phone]  Placed on the team clerk's desk

## 2019-02-03 NOTE — Unmapped (Signed)
Fax Received From: [facility]  Placed in the provider's box

## 2019-02-09 MED ORDER — CREON 36,000 UNIT-114,000 UNIT-180,000 UNIT CAPSULE,DELAYED RELEASE
ORAL_CAPSULE | 11 refills | 0 days | Status: CP
Start: 2019-02-09 — End: 2020-02-09

## 2019-02-09 NOTE — Unmapped (Signed)
[facility] Refill Coordination Note    Specialty Medication(s) to be Shipped:   CF/Pulmonary: -Trikafta    Other medication(s) to be shipped: N/A     Christian Young, DOB: 26-Apr-1986  Phone: [phone] (home)       All above HIPAA information was verified with patient's family member.     Completed refill call assessment today to schedule patient's medication shipment from the [facility] [phone]).       Specialty medication(s) and dose(s) confirmed: Regimen is correct and unchanged.   Changes to medications: Christian Young reports no changes reported at this time.  Changes to insurance: No  Questions for the pharmacist: No    Confirmed patient received Welcome Packet with first shipment. The patient will receive a drug information handout for each medication shipped and additional FDA Medication Guides as required.       DISEASE/MEDICATION-SPECIFIC INFORMATION        For CF patients: CF [facility] Active? Yes    SPECIALTY MEDICATION ADHERENCE     Medication Adherence    Support network for adherence:  family member                symdeko 150 mg: 7 days of medicine on hand         SHIPPING     Shipping address confirmed in Epic.     Delivery Scheduled: Yes, Expected medication delivery date: 02/11/2019.     Medication will be delivered via UPS to the home address in Epic [place].    Darwin Rothlisberger Perlie Gold   [facility] Specialty Technician

## 2019-02-09 NOTE — Unmapped (Signed)
[facility]  Adult Cystic Fibrosis Clinic      As part of [facility] CF [employer] Program, MSW mailed patient [phone] gift card for food due to financial difficulties and associated food insecurity in clinic.      SW REFERRAL SOURCE/REASON: Christian Young is a 33 y.o. male followed by [facility] for his CF. Focus of conversation today is introducing self, building rapport, conducting assessment and addressing psychosocial needs. This assessment was completed by phone due to Covid-19.     CURRENT LIVING ARRANGEMENTS/SUPPORTS: Christian Young lives with his wife Christian Young in an apartment in [place]. His in-laws live nearby, and he named that his wife is supportive.     SAFETY: No safety concerns were reported.     TRANSPORTATION: He has reliable transportation and denied concerns.     EDUCATION/EMPLOYMENT/FINANCIAL STATUS: Christian Young is not working. His wife works as an Museum/gallery exhibitions officer and during the pandemic, has been doing 24 hr shifts. She is taking precautions of changing clothes at her base, along with masking, hand washing, etc. He reported that she has been working more, so they are doing fine financially.     HOBBIES/INTERESTS: He mentioned that he enjoys playing video games on his Play Station.     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 He has a Orthoptist and said that and [employer] have been very helpful. No concerns regarding affordability of care.     FOOD INSECURITY: Denies    ADHERENCE: Christian Young reported that he has been going for regular walks around his apartment complex, taking 2-3 loops to get some exercise. He said he is not having any difficulties with airway clearance, is taking medications.     MENTAL HEALTH/COPING: Formal mental health screenings were not completed as part of this assessment, as it was done by phone. Christian Young shared that he continues to experience high anxiety and level of depression varies day to day. He said loud noises and having a lot of people around him can trigger anxiety, but beyond that, he is not sure what other contributing factors are. He feels his mood has been impacted by the pandemic and resulting isolation. He is taking medication for both anxiety and depression, which are prescribed by a psychiatrist. He has a virtual appt with his psychiatrist this Weds and said he is planning to ask for a recommendation for a local therapist. Per chart review, he used to see a therapist in [place] and indicated it being helpful.      SUBSTANCE USE:  No formal substance abuse screenings were completed during assessment since it was done over the phone. Christian Young reported that he drinks very occasionally on a social basis, and when he does, it is 1-2 beers. He denied illicit drug use.     PLAN:  No needs cited by pt during call. He is aware of how to reach SW/team should needs arise. Will continue to be available for psychosocial needs.       Nonah Mattes, LCSW

## 2019-02-10 MED FILL — SYMDEKO 100 MG-150 MG (DAY)/150 MG (NIGHT) TABLETS: 28 days supply | Qty: 56 | Fill #3

## 2019-02-10 MED FILL — SYMDEKO 100 MG-150 MG (DAY)/150 MG (NIGHT) TABLETS: 28 days supply | Qty: 56 | Fill #3 | Status: AC

## 2019-02-12 NOTE — Unmapped (Signed)
Spoke with pt over the phone. Went over smoking, allergies, and medications. Dexcom linked to clinic.

## 2019-02-12 NOTE — Unmapped (Signed)
Dexcom linked to clinic. Sent MyChart message to remind pt to go over medications and allergies.

## 2019-02-14 NOTE — Unmapped (Signed)
ENDOCRINE/DIABETES TELEVISIT (VIDEO VISIT)    I spent 20 minutes on the audio/video with the patient. I spent an additional 5 minutes on pre- and post-visit activities.     The patient was physically located in [place] or a state in which I am permitted to provide care. The patient and/or parent/guardian understood that s/he may incur co-pays and cost sharing, and agreed to the telemedicine visit. The visit was reasonable and appropriate under the circumstances given the patient's presentation at the time.    The patient and/or parent/guardian has been advised of the potential risks and limitations of this mode of treatment (including, but not limited to, the absence of in-person examination) and has agreed to be treated using telemedicine. The patient's/patient's family's questions regarding telemedicine have been answered.     If the visit was completed in an ambulatory setting, the patient and/or parent/guardian has also been advised to contact their provider???s office for worsening conditions, and seek emergency medical treatment and/or call 911 if the patient deems either necessary.      Chief Complaint:     33 y.o. male with a PMH of CF, HTN w CFRD.   No chief complaint on file.    Subjective:     HPI   Last seen by myself 10/08/2017. CFRD dx'd ~2/19.     Last seen by myself 06/19/2018  At last visit:   ? Congratulations on your good results  ? Increase Basaglar to 70 units  ? Increase  Novolog to 34 units  ? OK to adjust Basaglar upward/downward in 10-20% increments every 3-7 days based on pattern  ? Baqsimi glucagon nasal if covered  - Ambulatory referral to Diabetic Education; Future for discussion of pump therapy/options      Since last visit:   Hospitalized June for pulm infection, completed abx, eval'd remotely by inpt consult svc  Past 2 days no Novolog, still taking Basaglar 60-70 units  hs  Now has Novolog planning to take w dinner hasn't taken today despite toast w grape jelly this AM postprandial glucose 220   Dosing the Novolog 60 of Novolog plus scale at dinnertime and meals  Weight staying stable, trying to lose some healthily  Breathing improved allergy season kicking up rough  Most days walks around apartment and some times just tired  Occas pins/needles feet, no blisters, lesions, pain, swelling    Currently taking the following for CFRD:   Basaglar Lantus insulin 60 units qhs  Aspart insulin ~30 but someitmes up to 60 units ac meals, 8 units ac snacks + 4:50>150    Denies symptoms consistent with peripheral or autonomic neuropathy  Denies recent lows requiring assistance  Denies recent nocturnal lows    Continuous glucose monitor (CGM) was computer downloaded, I personally reviewed greater than 72 hours of data and interpreted, and discussed the findings with the patient.   Data summary pasted in CMA note  14 days reviewed, most relevant for basal are 3 most recent days pasted below, implying current basal dose appropriate    _______________________________      Initial HPI carried forward.   Patient is a 33 year old male with a PMH of CF, HTN and GAD/MDD who presents for a recurrence of CFRD.     The patient was initially treated for cystic fibrosis related diabetes (CFRD) when he received his care at [facility] with 5 units of levemir at night. Subsequently, after moving to [place], he was taken off of his diabetes medications which  was approximately 3 years ago. Since then he has continued to check his blood sugar approximately three times per day. Recently, his blood sugars have been typically running 200's - 300's. He was scheduled for endocrinology assessment for CFRD and prior to today's appointment, his hemoglobin A1c was found to be 7.6%. Upon arrival to the clinic today, his POCT glucose was 306 mg/dL.     He denies any regular hypglycemic episodes. If he does not eat much, he reports that he will occasionally dip lower than usual (approxiamtely once per week his blood glucose will run <100 mg/dL and approximately once per month his blood glucose will be <60 mg/dL). He states that recently, however, the lowest he has seen is 160 mg/dL. He endorses occasional blurriness in his vision and denies any neuropathic symptoms in his feet. He requires a new meter. His cystic fibrosis affects his lungs, sinuses and GI tract; he endorses pancreatic involvement. He does not appear malnourished. Most recently he was hospitalized in January 2019 for a CF-bronchiectasis exacerbation. He had a RUL lobectomy in August 2018. For his HTN, he takes lisinopril and carvedilol and he takes lamotrigine for his GAD and MDD.    The past medical history, surgical history, family history, social history, medications and allergies were reviewed in Epic.    Past Medical History:   Diagnosis Date   ??? Anxiety    ??? Chronic pain disorder    ??? Cystic fibrosis (CMS-HCC)    ??? Depression    ??? Hypertension    ??? Nonproductive cough 04/05/2018     Past Surgical History:   Procedure Laterality Date   ??? PR REMOVAL OF LUNG,LOBECTOMY Right 03/29/2017    Procedure: REMOVAL OF LUNG, OTHER THAN PNEUMONECTOMY; SINGLE LOBE (LOBECTOMY);  Surgeon: Cherie Dark, MD;  Location: MAIN OR [facility];  Service: Thoracic     Current Outpatient Medications on File Prior to Visit   Medication Sig Dispense Refill   ??? albuterol (PROVENTIL HFA;VENTOLIN HFA) 90 mcg/actuation inhaler Inhale 2 puffs every six (6) hours as needed. 1 Inhaler 1   ??? albuterol 2.5 mg/0.5 mL nebulizer solution Inhale 0.5 mL (2.5 mg total) by nebulization every six (6) hours as needed for wheezing. 90 each 3   ??? amitriptyline (ELAVIL) 25 MG tablet Take 25 mg by mouth nightly.     ??? blood sugar diagnostic Strp Dispense 150 blood glucose test strips, ok to sub any brand preferred by insurance/patient, use up to 5x/day, dx E 08.9 450 strip 12   ??? blood-glucose meter kit Dispense meter that is preferred by patient's insurance company 1 each 0   ??? blood-glucose meter,continuous (DEXCOM G6 RECEIVER) Misc Use as directed 1 each 0   ??? blood-glucose sensor (DEXCOM G6 SENSOR) Devi Use sq as directed every 14 days 6 Device 11   ??? blood-glucose transmitter (DEXCOM G6 TRANSMITTER) Devi Use as directed 1 Device 11   ??? budesonide-formoterol (SYMBICORT) 160-4.5 mcg/actuation inhaler Inhale 2 puffs Two (2) times a day. 20 g 11   ??? cetirizine (ZYRTEC) 10 MG tablet TAKE 1 TABLET (10 MG TOTAL) BY MOUTH DAILY. 90 tablet 3   ??? CREON 36,000-114,000- 180,000 unit CpDR TAKE 7 TO 8 CAPSULES WITH MEALS AND 5 TO 6 CAPSULES WITH SNACKS 1200 capsule 11   ??? diclofenac sodium (VOLTAREN) 1 % gel Apply 2 g topically Four (4) times a day. 100 g 2   ??? dornase alfa (PULMOZYME) 1 mg/mL nebulizer solution Inhale 2.5 mg daily. 225 mL 3   ???  EASY TOUCH LANCING DEVICE Misc Use as directed.     ??? EASY TOUCH TWIST LANCETS 30 gauge Misc Use as directed.     ??? FLUoxetine (PROZAC) 20 MG capsule Take 40 mg by mouth daily.     ??? gabapentin enacarbil (HORIZANT) 600 mg TbER Take 1,200 mg by mouth nightly.      ??? glucagon, human recombinant, (GLUCAGON) 1 mg/ml injection Inject 1 mL (1 mg total) under the skin once as needed (severe hypoglycemia). Follow package directions for low blood sugar. 1 mL 11   ??? insulin ASPART (NOVOLOG FLEXPEN) 100 unit/mL (3 mL) injection pen Use up to 150 units/day, divided TID AC meals (Patient taking differently: 60-80 units TID AC) 135 mL 12   ??? insulin glargine (BASAGLAR, LANTUS) 100 unit/mL (3 mL) injection pen Inject 0.7 mL (70 Units total) under the skin daily. (Patient taking differently: Inject 70 Units under the skin nightly. ) 30 mL 0   ??? lamoTRIgine (LAMICTAL) 200 MG tablet Take 200 mg by mouth daily.     ??? lidocaine-prilocaine (EMLA) 2.5-2.5 % cream Apply topically every twelve (12) hours. 30 g 0   ??? lisinopriL (PRINIVIL,ZESTRIL) 10 MG tablet Take 1 tablet (10 mg total) by mouth daily. 30 tablet 0   ??? melatonin 10 mg Tab Take 10 mg by mouth nightly.     ??? montelukast (SINGULAIR) 10 mg tablet Take 1 tablet (10 mg total) by mouth nightly. 90 tablet 3   ??? MVW COMPLETE FORMUL PROBIOTIC 40 billion cell -15 mg CpDR Take 1 capsule by mouth daily. 90 capsule 3   ??? MVW Complete, pediatric multivit 61-D3-vit K, 1,500-800 unit-mcg cap Take 2 capsules by mouth daily. 60 capsule 11   ??? nebulizers Misc Use as directed with inhaled medications 4 each 3   ??? omeprazole (PRILOSEC) 20 MG capsule Take 1 capsule (20 mg total) by mouth daily. 30 capsule 0   ??? pen needle, diabetic (BD ULTRA-FINE NANO PEN NEEDLE) 32 gauge x 5/32 Ndle ok to sub any brand or size needle preferred by insurance/patient, use up to 4x/day, dx E 08.9 450 each 12   ??? pramipexole (MIRAPEX) 0.125 MG tablet Take 0.25 mg by mouth daily.      ??? sodium chloride 7% 7 % Nebu Inhale 4 mL by nebulization Two (2) times a day. Increase to 4 times while taking antibiotics (Patient taking differently: Inhale 4 mL by nebulization Two (2) times a day. ) 720 mL 3   ??? tezacaftor 100mg /ivacaftor 150mg  and ivacaftor 150mg  (SYMDEKO) tablets TAKE BY MOUTH AS DIRECTED ON PACKAGE LABELING 56 each 11   ??? tobramycin (BETHKIS) 300 mg/4 mL Nebu Inhale 300 mg every twelve (12) hours. For 28 days, alternating every other 28 days 224 mL 6   ??? traZODone (DESYREL) 100 MG tablet Take 2 tablets (200 mg total) by mouth nightly. 60 tablet 0   ??? XARELTO 20 mg tablet Take 20 mg by mouth daily.     ??? [DISCONTINUED] insulin detemir U-100 (LEVEMIR FLEXTOUCH U-100 INSULN) 100 unit/mL (3 mL) injection pen Use up to 30 units per day, as per MD instructions 30 mL 12     No current facility-administered medications on file prior to visit.      SOCIAL HISTORY:  Social History     Social History Narrative   ??? Not on file       FAMILY HISTORY:  Family History   Problem Relation Age of Onset   ??? Bipolar disorder Mother    ???  Depression Mother      ROS:  The balance of 10 systems was reviewed and unremarkable except as stated above.     Objective:   There were no vitals taken for this visit.  Alert and oriented with appropriate mood and affect. In no distress and in good spirits on video  Remainder not done today d/t virtual visit  Carrying previous exam forward for future reference:  [NECK: Supple without carotid bruits, thyromegaly or lymphadenopathy.   HEART: Regular rate and rhythm without murmur, rub, gallop.  ABDOMEN: Soft and nontender without masses.   EXTREMITIES: Showed no edema    REVIEW OF RECENT PERTINENT LABORATORY AND IMAGING DATA:    Lab Results   Component Value Date    A1C 8.5 (H) 08/12/2018    A1C 10.7 (H) 04/06/2018    A1C 9.0 (H) 01/07/2018    A1C 6.1 (H) 11/19/2016    A1C 6.3 (H) 11/18/2016     Lab Results   Component Value Date    CREATININE 0.85 01/29/2019     Lab Results   Component Value Date    GFRNONAA >=60 01/08/2018     Lab Results   Component Value Date    GFRAA >=60 01/08/2018     Lab Results   Component Value Date    K 4.7 01/29/2019     No results found for: CHOL  No results found for: LDL  No results found for: HDL  No results found for: TRIG  Lab Results   Component Value Date    ALT 71 (H) 01/29/2019     No results found for: LABCREA, ALBQTUR, MSHCGMOM, ALBCRERAT  No results found for: VITAMINB12  Lab Results   Component Value Date    TSH 2.120 11/18/2016     Lab Results   Component Value Date    WBC 7.9 01/29/2019    HGB 13.8 01/29/2019    HCT 44.3 01/29/2019    PLT 338 01/29/2019     Lab Results   Component Value Date    VITDTOTAL 27.9 03/19/2017     Lab Results   Component Value Date    CALCIUM 9.5 01/29/2019       Assessment/Plan:   1. Diabetes mellitus related to CF (cystic fibrosis) (CMS-HCC)  -He's doing well w insulin dosing and CGM, latest CGM data implies basal insulin dose is appropriate given stability overnight  -Looks like he could take less prandial insulin, as recent days w/o aspart had only mild excursions, unless these were d/t significantly restricted CHO which he stated he did some of, though am concerned that reported dose of 60 units ac is way too much regardless  -Previously discussed details of pump therapy, he'd be a good candidate, might benefit from some CHO counting for prandial dosing, not critical in my opinion however given ~glycemic stability  Patient Instructions   It was a pleasure to see you today!  ? Novolog 30 units for meals, 8 units for snacks, + 4:50>150  ? Continue Basaglar, try to keep dose stable, if taking 70 units recently that looks appropriate as we discussed  ? OK to take Basaglar any time ie morning fine as long as taking same time of day each day  ? A1c is ordered in case any labs done in interim  ? We should regroup in about 3 mo for recheck    2. Encounter for long-term (current) use of insulin (CMS-HCC)  As above    3. Obesity  Noted, encouraged  dietary efforts, though need to balance against potential for malabsorption/caloric deficits related to CF    Follow up recommended in about 3 mo, he is aware to contact us or return sooner for interim concerns.

## 2019-02-14 NOTE — Unmapped (Addendum)
It was a pleasure to see you today!  ? Novolog 30 units for meals, 8 units for snacks, + 4:50>150  ? Continue Basaglar, try to keep dose stable, if taking 70 units recently that looks appropriate as we discussed  ? OK to take Basaglar any time ie morning fine as long as taking same time of day each day  ? A1c is ordered in case any labs done in interim  ? We should regroup in about 3 mo for recheck  ?   ?

## 2019-02-16 ENCOUNTER — Telehealth
Admit: 2019-02-16 | Discharge: 2019-02-17 | Payer: PRIVATE HEALTH INSURANCE | Attending: "Endocrinology | Primary: "Endocrinology

## 2019-02-16 DIAGNOSIS — Z794 Long term (current) use of insulin: Secondary | ICD-10-CM

## 2019-02-16 DIAGNOSIS — E089 Diabetes mellitus due to underlying condition without complications: Secondary | ICD-10-CM

## 2019-02-16 NOTE — Unmapped (Signed)
[place] [facility] ([facility]-- Pharmacy Assistance Program (PAP)    Brief chart review conducted to assess eligibility and potential benefit from enrollment in the [place] [facility] Program (CAMP). Patient does not currently meet eligibility requirements. Enrollment will be deferred at this time.    Shanon Brow, PharmD, CPP  Clinical Pharmacist, [place] [facility] ([facility])  [facility]: [phone] - Fax: [phone]

## 2019-02-16 NOTE — Unmapped (Signed)
Spoke with pt over the phone. Went over smoking, allergies, and medications. Dexcom linked to clinic.

## 2019-03-02 ENCOUNTER — Ambulatory Visit (INDEPENDENT_AMBULATORY_CARE_PROVIDER_SITE_OTHER): Payer: 59 | Admitting: Psychology

## 2019-03-02 DIAGNOSIS — F411 Generalized anxiety disorder: Secondary | ICD-10-CM | POA: Diagnosis not present

## 2019-03-02 DIAGNOSIS — F330 Major depressive disorder, recurrent, mild: Secondary | ICD-10-CM | POA: Diagnosis not present

## 2019-03-02 NOTE — Unmapped (Addendum)
Discussed briefly with Christian Young. She wanted to know if the pain was interior or exterior. Christian Young states it is hard to know and varies, sometime more interior pain and sometime exterior.     Did not hear or feel a pop with this pain, but just noticed a really sharp pain.     Advised him to get an xray completed through [facility] (suggesting [facility] in [place]) and also making an appointment with his PCP. [name] states he will not be able to go today for his xray, but should be able to go tomorrow.    [name], RN  CF Nurse Coordinator   [phone]

## 2019-03-02 NOTE — Unmapped (Signed)
Patient messaged his provider over the weekend:    Hello, I am sending this message becuase for the past couple days my incision site has been hurting so badly I can stand up straight. So I was wondering what you think I should do to get some relief, I have tried Tylenol, ice,ibuprofen. Nothing seems to bring me relief.      Christian Young is reporting the pain is about the same. Mainly in the middle of the incision, but extends along the incison. Pain is just on his right side where he had his lobectomy.    Pain began when his was cleaning up his room and taking dirty clothes to the laundry room, and had a suddenly had a really bad pain in his side. This was on Friday. Pain has been constant since. Rates the pain as an eight out of ten. The pain has been preventing him from doing much activity. If he does anything more than sitting it aggravates it. Christian Young has tried, Tylenol, Ibuprofen, cold and heat without improvement in the pain. Christian Young notes it looks a little bit swollen, but doesn't see any skin discoloration.     No change in pulmonary status, denies fevers, change in cough. Able to continue to use his airway clearance device (Monach Vest) as the vest doesn't pound on his flank.     Christian Young's wife, an EMT, thinks he pulled a muscle. Should something be prescribed, please use the [facility] in [place]. Mom and dad are available today and could take him for an xray if indicated.       Will route to [facility], PA-C and Dr [name], as Dr [name] is on service in the MICU.    Christian Flake [name], RN  CF Nurse Coordinator   [phone]

## 2019-03-04 NOTE — Unmapped (Signed)
[facility] Refill Coordination Note    Specialty Medication(s) to be Shipped:   CF/Pulmonary: -SYMDEKO (tezacaftor 100mg /ivacaftor 150mg  and ivacaftor 150mg ) tablets    Other medication(s) to be shipped: n/a     Christian Young, DOB: 09-Nov-1985  Phone: [phone] (home)       All above HIPAA information was verified with patient.     Completed refill call assessment today to schedule patient's medication shipment from the [facility] [phone]).       Specialty medication(s) and dose(s) confirmed: Regimen is correct and unchanged.   Changes to medications: Christian Young reports no changes reported at this time.  Changes to insurance: No  Questions for the pharmacist: No    Confirmed patient received Welcome Packet with first shipment. The patient will receive a drug information handout for each medication shipped and additional FDA Medication Guides as required.       DISEASE/MEDICATION-SPECIFIC INFORMATION        For CF patients: CF [facility] Active? Yes - active but swept    SPECIALTY MEDICATION ADHERENCE     Medication Adherence    Support network for adherence: family member                Symdeko 100-150 mg: 7 days of medicine on hand       SHIPPING     Shipping address confirmed in Epic.     Delivery Scheduled: Yes, Expected medication delivery date: 03/10/19.     Medication will be delivered via UPS to the home address in Epic [place].    Christian Young   [facility] Specialty Pharmacist

## 2019-03-06 NOTE — Unmapped (Signed)
Spoke with Christian Young, as we hadn't seen a xray completed. Christian Young did go to his PCP. They too thought it was a pulled muscle or pulled tendon. They did not think it was a cracked rib. He notes he was probably doing too much, (lifting 3 bags of laundry). Christian Young was prescribed gabapentin for this discomfort and recommended OTC lidocaine patches to be applied to the area. He has not started taking gabapentin, as he just picked it up. Denies other needs. Encouraged him to reach out if anything arises. Verbalized appreciation for the follow up.    Update routed to Dr. [name] and Durenda Hurt Rupcich. PA-C.    [name], RN  CF Nurse Coordinator   [phone]

## 2019-03-09 MED FILL — SYMDEKO 100 MG-150 MG (DAY)/150 MG (NIGHT) TABLETS: 28 days supply | Qty: 56 | Fill #4

## 2019-03-09 MED FILL — SYMDEKO 100 MG-150 MG (DAY)/150 MG (NIGHT) TABLETS: 28 days supply | Qty: 56 | Fill #4 | Status: AC

## 2019-03-13 NOTE — Unmapped (Signed)
Thanks for using your home spirometer!    We have reviewed your most recent test results. They are Decreased from baseline.     We recommend: Phone follow up    Reminders about your home spirometer:   - Follow the prompts and blow out for at least 6 seconds  - Perform at least 3 trials with each use   - Clean your home spirometer after each use according to instructions    If you have any questions, please call our office:   ? For last name A-K Joni Reining): [phone]   ? For last name L-Z Harriett Sine): at [phone]

## 2019-03-13 NOTE — Unmapped (Signed)
HOME SPIROMETRY MONITORING    03/13/19    I reviewed 1 home spirometry test over the last calendar month.  Most recent test demonstrates Moderate obstruction.  Compared to prior home spirometry values, the most recent test is worse. His best FEV1 in-person over the last year is 2.98 L.     Quality of most recent test graded A    Required follow-up: Telephone encounter to be scheduled. Patient has Pulmonary video visit scheduled on 03/16/19 with Dr. [name].             Durenda Hurt Marion, PA-C  [facility]   [phone]

## 2019-03-16 ENCOUNTER — Telehealth: Admit: 2019-03-16 | Discharge: 2019-03-17 | Payer: PRIVATE HEALTH INSURANCE

## 2019-03-16 DIAGNOSIS — R05 Cough: Secondary | ICD-10-CM

## 2019-03-16 DIAGNOSIS — J449 Chronic obstructive pulmonary disease, unspecified: Secondary | ICD-10-CM

## 2019-03-16 NOTE — Unmapped (Addendum)
[facility] Pulmonary Diseases and Critical Care Medicine  Video Telehealth Visit Note    03/16/19    3:27 PM    Subjective:        Patient's primary [place] pulmonologist: Dr [name]  Patient's physical location during visit [place], [place]): [place]  Patient's call back number: [phone]    Name/Relationship of other individuals with the patient in the room during video telehealth visit: Spouse  I have confirmed that the patient requests and consents to having the above listed persons stay in the patient's room during the video telehealth visit.    The patient was advised that the provider must close the video visit first when visit is completed.    HPI: Mr. Kalisz is a 33 y.o. male who is seen in a video telehealth visit for a follow up.    Interval History:  Diagnosed at 59 with chronic infections, had consolidation and parapneumonic effusion,sent to [facility] for treatment since then.   [id number] and [id number] insertion. 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 Ricci, has been doing well.  He was admitted in May and in June at [facility] for a cystic fibrosis pulmonary exacerbation.  He was given IV antibiotics with Zosyn and improved with a 14-day course both times.  In his first admission, he did develop an acute interstitial nephritis which resolved with adequate fluid hydration and cessation of antibiotics.  Since discharge, he reports doing his airway clearance very well.  Last week, he had driven to [place], and once he came back, has started to notice dry cough.  Reports occasional night sweats, however no fever.  Did report wearing masks everywhere however there were people around him who were not covered.  ??  Airway clearance: He reports being a lot more compliant than his last visit.  Does albuterol, followed by 7% hypertonic saline and Pulmozyme twice a day.  He has been using his new Monarch vest 4 times a day now.  He has also been doing tobramycin nebulizers this month.    Sinuses: He reported doing sinus clearance yesterday, and noted dark green-colored mucus coming out, which made him feel a lot better.  However, he does not do sinus rinses every day and only does it when he starts having symptoms.  ??  GI and nutrition: Denies any constipation or diarrhea.  Reports compliance to his Creon.   He is followed by endocrinology for his diabetes.  He did report 2 episodes of low blood sugars between 40 and 50 with symptoms of tremors.  Noted that he had eaten less after the insulin dose and that may have triggered it.  Endocrinology is aware according to him.    ??  Mental health issues: He follows with a psychologist and a psychiatrist, and now his regimen is a lot more tailored and is on Prozac, Lamictal and amitriptyline with occasional Xanax use.  ??  Physical fitness:  Still does not exercise or walk.    Obstructive lung disease: He uses albuterol occasionally however has only been using Symbicort once a day instead of twice a day.    NTM history: In his admission in May at [facility], her sputum culture was positive for MAC.  I had repeated 2 sputum cultures thereafter, both of which have been negative.      Genetics:  [id number] and [id number] insertion  ??  Airway Clearance Regimen:  HS BID  Pulmozyme  ??  Inhaled ABX:  Tobramycin  ??  Hemoptysis:??????No  ABPA:??????????????????????????No  Ptx:????????????????????????????????????No  Sinusitus:??????????????Yes  ??  Panc Insuf:??????????Yes  PEG:????????????????????????????????No  DIOS:????????????????????????????No bowel issues  CF Liver Dz:??????No  ??  Diabetes:????????????????Uncontrolled - follows endocrine  Osteopenia:??????N/A  ??  Depression:??????Yes  ??  Other Co-morbidities:  N/A  ??  Social Setting:  Lives with wife near [place]  ??  Micro History:  ??  MABSC  - Assumed S to erythro from [facility]  06/2017: MASBC not present, but M gordonae is   ??  Smooth PsA  S: Cipro, Levo, Tobra  I: Aztreo, Cefe, Ceftaz, PT, LVX  ??  Stenotrophamonas  R: Ceftat  ??  Past Medical History:   Diagnosis Date   ??? Anxiety    ??? Chronic pain disorder    ??? Cystic fibrosis (CMS-HCC)    ??? Depression    ??? Hypertension    ??? Nonproductive cough 04/05/2018       Past Surgical History:   Procedure Laterality Date   ??? PR REMOVAL OF LUNG,LOBECTOMY Right 03/29/2017    Procedure: REMOVAL OF LUNG, OTHER THAN PNEUMONECTOMY; SINGLE LOBE (LOBECTOMY);  Surgeon: Cherie Dark, MD;  Location: MAIN OR [facility];  Service: Thoracic       Family History   Problem Relation Age of Onset   ??? Bipolar disorder Mother    ??? Depression Mother        Social History     Tobacco Use   ??? Smoking status: Never Smoker   ??? Smokeless tobacco: Never Used   Substance Use Topics   ??? Alcohol use: Yes     Alcohol/week: 3.0 standard drinks     Types: 3 Glasses of wine per week     Comment: 3 bottles of wine 2 days ago   ??? Drug use: No       Allergies as of 03/16/2019 - Reviewed 03/16/2019   Allergen Reaction Noted   ??? [place] [aztreonam lysine] Anaphylaxis 12/27/2016   ??? Cefepime Itching and Nausea Only 09/06/2015   ??? Other Anaphylaxis and Other (See Comments) 09/06/2015   ??? Slo-bid 100 Anaphylaxis 06/20/2017   ??? Banana Itching and Nausea And Vomiting 07/29/2016   ??? Tobramycin Tinnitus 09/06/2015       Current Outpatient Medications   Medication Sig Dispense Refill   ??? albuterol (PROVENTIL HFA;VENTOLIN HFA) 90 mcg/actuation inhaler Inhale 2 puffs every six (6) hours as needed. 1 Inhaler 1   ??? albuterol 2.5 mg/0.5 mL nebulizer solution Inhale 0.5 mL (2.5 mg total) by nebulization every six (6) hours as needed for wheezing. 90 each 3   ??? amitriptyline (ELAVIL) 25 MG tablet Take 25 mg by mouth nightly.     ??? budesonide-formoterol (SYMBICORT) 160-4.5 mcg/actuation inhaler Inhale 2 puffs Two (2) times a day. 20 g 11   ??? cetirizine (ZYRTEC) 10 MG tablet TAKE 1 TABLET (10 MG TOTAL) BY MOUTH DAILY. 90 tablet 3   ??? CREON 36,000-114,000- 180,000 unit CpDR TAKE 7 TO 8 CAPSULES WITH MEALS AND 5 TO 6 CAPSULES WITH SNACKS 1200 capsule 11   ??? dornase alfa (PULMOZYME) 1 mg/mL nebulizer solution Inhale 2.5 mg daily. 225 mL 3   ??? FLUoxetine (PROZAC) 20 MG capsule Take 40 mg by mouth daily.     ??? gabapentin enacarbil (HORIZANT) 600 mg TbER Take 1,200 mg by mouth nightly.      ??? insulin ASPART (NOVOLOG FLEXPEN) 100 unit/mL (3 mL) injection pen Use up to 150 units/day, divided TID AC meals (Patient taking differently: 60-80 units TID AC) 135 mL 12   ???  lamoTRIgine (LAMICTAL) 200 MG tablet Take 200 mg by mouth daily.     ??? melatonin 10 mg Tab Take 10 mg by mouth nightly.     ??? montelukast (SINGULAIR) 10 mg tablet Take 1 tablet (10 mg total) by mouth nightly. 90 tablet 3   ??? MVW COMPLETE FORMUL PROBIOTIC 40 billion cell -15 mg CpDR Take 1 capsule by mouth daily. 90 capsule 3   ??? omeprazole (PRILOSEC) 20 MG capsule Take 1 capsule (20 mg total) by mouth daily. 30 capsule 0   ??? pramipexole (MIRAPEX) 0.125 MG tablet Take 0.25 mg by mouth daily.      ??? sodium chloride 7% 7 % Nebu Inhale 4 mL by nebulization Two (2) times a day. Increase to 4 times while taking antibiotics (Patient taking differently: Inhale 4 mL by nebulization Two (2) times a day. ) 720 mL 3   ??? tezacaftor 100mg /ivacaftor 150mg  and ivacaftor 150mg  (SYMDEKO) tablets TAKE BY MOUTH AS DIRECTED ON PACKAGE LABELING 56 each 11   ??? tobramycin (BETHKIS) 300 mg/4 mL Nebu Inhale 300 mg every twelve (12) hours. For 28 days, alternating every other 28 days 224 mL 6   ??? traZODone (DESYREL) 100 MG tablet Take 2 tablets (200 mg total) by mouth nightly. 60 tablet 0   ??? XARELTO 20 mg tablet Take 20 mg by mouth daily.     ??? blood sugar diagnostic Strp Dispense 150 blood glucose test strips, ok to sub any brand preferred by insurance/patient, use up to 5x/day, dx E 08.9 450 strip 12   ??? blood-glucose meter kit Dispense meter that is preferred by patient's insurance company 1 each 0   ??? blood-glucose meter,continuous (DEXCOM G6 RECEIVER) Misc Use as directed 1 each 0   ??? blood-glucose sensor (DEXCOM G6 SENSOR) Devi Use sq as directed every 14 days 6 Device 11   ??? blood-glucose transmitter (DEXCOM G6 TRANSMITTER) Devi Use as directed 1 Device 11   ??? diclofenac sodium (VOLTAREN) 1 % gel Apply 2 g topically Four (4) times a day. (Patient not taking: Reported on 03/16/2019) 100 g 2   ??? EASY TOUCH LANCING DEVICE Misc Use as directed.     ??? EASY TOUCH TWIST LANCETS 30 gauge Misc Use as directed.     ??? glucagon, human recombinant, (GLUCAGON) 1 mg/ml injection Inject 1 mL (1 mg total) under the skin once as needed (severe hypoglycemia). Follow package directions for low blood sugar. (Patient not taking: Reported on 03/16/2019) 1 mL 11   ??? insulin glargine (BASAGLAR, LANTUS) 100 unit/mL (3 mL) injection pen Inject 0.7 mL (70 Units total) under the skin daily. (Patient not taking: Reported on 03/16/2019) 30 mL 0   ??? lidocaine-prilocaine (EMLA) 2.5-2.5 % cream Apply topically every twelve (12) hours. 30 g 0   ??? lisinopriL (PRINIVIL,ZESTRIL) 10 MG tablet Take 1 tablet (10 mg total) by mouth daily. 30 tablet 0   ??? MVW Complete, pediatric multivit 61-D3-vit K, 1,500-800 unit-mcg cap Take 2 capsules by mouth daily. 60 capsule 11   ??? nebulizers Misc Use as directed with inhaled medications 4 each 3   ??? pen needle, diabetic (BD ULTRA-FINE NANO PEN NEEDLE) 32 gauge x 5/32 Ndle ok to sub any brand or size needle preferred by insurance/patient, use up to 4x/day, dx E 08.9 450 each 12     No current facility-administered medications for this visit.        Physical Exam (as applicable to extent possible):     General: Patient awake,  alert, wife present at bedside with him  Head neck ENT: No obvious abnormalities noted  Respiratory: No forceful expiratory wheeze heard on expiration      Diagnostic Review:   The following data were reviewed during this video telehealth visit with key findings summarized below:    Pulmonary Function Testing         Laboratory Data  Lab Results   Component Value Date    WBC 7.9 01/29/2019    HGB 13.8 01/29/2019    HCT 44.3 01/29/2019    PLT 338 01/29/2019       Chemistry Component Value Date/Time    NA 138 01/29/2019 1254    K 4.7 01/29/2019 1254    CL 102 01/29/2019 1254    CO2 26.0 01/29/2019 1254    BUN 12 01/29/2019 1254    CREATININE 0.85 01/29/2019 1254    GLU 97 01/29/2019 1254        Component Value Date/Time    CALCIUM 9.5 01/29/2019 1254    ALKPHOS 74 01/29/2019 1254    AST 65 (H) 01/29/2019 1254    ALT 71 (H) 01/29/2019 1254    BILITOT 0.4 01/29/2019 1254            Assessment:      Patient:Zekiel Elray Buba (Jan 19, 1986)    Mr. Winokur is a 33 y.o. male who is seen in a video telehealth visit for routine CF clinic follow-up.    Cough with night sweats: It seems like patient was doing well until 1 week ago, since his travel, has noted this dry cough which may be slightly getting.  Spirometry 1, FVC at his home spirometry which was also lower than his typical baseline from the clinic.  At this point, we have 3 potential plan of action depending upon his symptoms and his future PFTs.  We have asked him to get 1 reading today and one reading on Thursday from his home spirometry and send it to [name].  If his cough improves but PFTs continue to decline, we will have him come to the clinic and repeat a PFT in the clinic.  If his cough improves and his PFTs improve, we would believe that this was probably secondary to less Compliance to his Symbicort, which he will start taking twice a day starting today.  If his cough and PFT both worsen, we will have him get Covid-19 testing and, if negative, will get repeat PFTs in the office for further evaluation.    Obstructive lung disease: We advised the patient to start taking his Symbicort, 2 puffs twice a day instead of once a day    Diabetes with hypoglycemic episodes: I advised him to keep a check with his sugars, and  Inform his endocrinologist if these happen again.    Nontuberculous mycobacteria: Given that he had 2- AFB sputum cultures after the one positive culture, I will continue to monitor and get a repeat CT scan at his next hospital admission if found appropriate.    Sinus clearance: I encouraged him, to do his sinus clearance every day instead of only when he thinks it is necessary to prevent sinus infections and pulmonary infections.    Port dysfunction: It was noted that his port would not draw back blood and would not flush in his last admission.  I will reach out to interventional radiology, regarding removal if appropriate.     Plan:      Problem List Items Addressed This Visit  Respiratory    Cystic fibrosis (CMS-HCC) - Primary      Other Visit Diagnoses     Cough        Chronic obstructive pulmonary disease, unspecified COPD type (CMS-HCC)                Planned Follow-Up: 6 months    The above plan was discussed with the patient and he is in agreement.           I personally spent 35 minutes with the patient during this video telehealth visit and greater than 50% of that time was spent in counseling or coordinating care with the patient.    I spent 35 minutes on the real-time audio and video with the patient. I spent an additional 10 minutes on pre- and post-visit activities.     The patient was physically located in [place] or a state in which I am permitted to provide care. The patient and/or parent/guardian understood that s/he may incur co-pays and cost sharing, and agreed to the telemedicine visit. The visit was reasonable and appropriate under the circumstances given the patient's presentation at the time.    The patient and/or parent/guardian has been advised of the potential risks and limitations of this mode of treatment (including, but not limited to, the absence of in-person examination) and has agreed to be treated using telemedicine. The patient's/patient's family's questions regarding telemedicine have been answered.     If the visit was completed in an ambulatory setting, the patient and/or parent/guardian has also been advised to contact their provider???s office for worsening conditions, and seek emergency medical treatment and/or call 911 if the patient deems either necessary.      Portions of this record have been created using Scientist, clinical (histocompatibility and immunogenetics). Dictation errors have been sought, but may not have been identified and corrected.    Darnelle Bos, MD  03/16/19  3:27 PM    Billing Guidance: [phone] (New Patients) and [phone] (Established Patients)    This was a telehealth service where a resident was involved. As the attending physician, I spent 6 minutes in medical discussion with the patient via real-time audio and video, participating in the key portions of the service. I reviewed the chart and resident's note. I agree with the resident's findings and plan.

## 2019-03-16 NOTE — Unmapped (Signed)
You were seen in teleclinic today by Dr [name] and Dr Curley Spice  We will call you with abnormal results if any labs or imaging ordered. We would encourage you to use MyChart.  Please call the clinic if you have any further questions or call 911 for emergency.  Thank you for giving me the opportunity to manage your health.  Please send me your spirometry results    Between appointments, you can reach [name] at these numbers:    For appointments or the Pulmonary Nurse: [phone]  Fax: [phone]    For urgent issues after hours:  Hospital Operator: [phone], ask for Pulmonary Fellow on call      I don't have a MyChart. Why should I get one?   - It's encrypted, so your information is secure  - It's a quick, easy way to contact the care team, manage appointments, see test results, and more!    How do I sign-up for MyChart?   - Download the MyChart app from the [employer] or [employer] and sign-up in the app  - Sign-up online at [email or web address]

## 2019-03-17 ENCOUNTER — Ambulatory Visit (INDEPENDENT_AMBULATORY_CARE_PROVIDER_SITE_OTHER): Payer: 59 | Admitting: Psychology

## 2019-03-17 DIAGNOSIS — F330 Major depressive disorder, recurrent, mild: Secondary | ICD-10-CM | POA: Diagnosis not present

## 2019-03-17 DIAGNOSIS — F411 Generalized anxiety disorder: Secondary | ICD-10-CM | POA: Diagnosis not present

## 2019-03-19 MED ORDER — CIPROFLOXACIN 750 MG TABLET
ORAL_TABLET | Freq: Two times a day (BID) | ORAL | 0 refills | 14.00000 days | Status: CP
Start: 2019-03-19 — End: 2019-04-08

## 2019-03-19 NOTE — Unmapped (Signed)
Called Mr Mathieson to check since his home spirometry numbers are still declining.    He continues to have low grade fever and cough which is progressively worse.    We have helped him set up a COVID test for 8/11 as he has no transport before that.    I have sent him a 14 day course of Cipro as he has grown pseudomonas in the past, mucoid and smooth. Although he has grown stenotrophomonas, it was in 2018 and he has never grown that again, in his sputum.    If he does not do well, he knows to let us  Know so we can get him admitted. If by Tuesday morning he isnt getting better, I will admit him based on his covid testing, or have him  Come to the ER for testing anyways.      Maia Plan, MD  PGY 5, Pulmonary and Critical Care  Pager: [phone]  March 19, 2019 12:38 PM

## 2019-03-19 NOTE — Unmapped (Addendum)
-----   Message from Darnelle Bos, MD sent at 03/19/2019 11:13 AM EDT -----  Hi,    This is a patient of mine who has CF. He has low grade  temperature and cough since 8 days. Travel to [place] last week after which his couch started.    Could he be scheduled to get tested for Covid?      What needs to be done next ?      Thank you        Maia Plan, MD  PGY 5  Pulmonary and Critical Care  Pager: [phone]  March 19, 2019 11:15 AM     [facility] scheduler was able to speak to the patient and he requested an appointment on 03/24/19 at 2:00 pm at [address] [place], [place].

## 2019-03-24 ENCOUNTER — Ambulatory Visit: Admit: 2019-03-24 | Discharge: 2019-03-25 | Payer: PRIVATE HEALTH INSURANCE | Attending: Family | Primary: Family

## 2019-03-24 ENCOUNTER — Ambulatory Visit
Admit: 2019-03-24 | Discharge: 2019-04-08 | Disposition: A | Discharge status: Home / Self Care | Payer: PRIVATE HEALTH INSURANCE

## 2019-03-24 DIAGNOSIS — Z20828 Contact with and (suspected) exposure to other viral communicable diseases: Principal | ICD-10-CM

## 2019-03-24 LAB — COMPREHENSIVE METABOLIC PANEL
ALBUMIN: 4.4 g/dL (ref 3.5–5.0)
ALKALINE PHOSPHATASE: 96 U/L (ref 38–126)
ALT (SGPT): 58 U/L — ABNORMAL HIGH (ref <50)
ANION GAP: 15 mmol/L (ref 7–15)
BILIRUBIN TOTAL: 0.5 mg/dL (ref 0.0–1.2)
BLOOD UREA NITROGEN: 12 mg/dL (ref 7–21)
BUN / CREAT RATIO: 15
CALCIUM: 9.7 mg/dL (ref 8.5–10.2)
CHLORIDE: 97 mmol/L — ABNORMAL LOW (ref 98–107)
CO2: 25 mmol/L (ref 22.0–30.0)
EGFR CKD-EPI AA MALE: >90 mL/min/{1.73_m2} (ref >=60)
EGFR CKD-EPI NON-AA MALE: >90 mL/min/{1.73_m2} (ref >=60)
GLUCOSE RANDOM: 219 mg/dL — ABNORMAL HIGH (ref 70–179)
POTASSIUM: 4.2 mmol/L (ref 3.5–5.0)
PROTEIN TOTAL: 7.4 g/dL (ref 6.5–8.3)
SODIUM: 137 mmol/L (ref 135–145)

## 2019-03-24 LAB — BLOOD GAS, VENOUS
BASE EXCESS VENOUS: 3.6 — ABNORMAL HIGH (ref -2.0–2.0)
HCO3 VENOUS: 27 mmol/L (ref 22–27)
O2 SATURATION VENOUS: 87.7 % — ABNORMAL HIGH (ref 40.0–85.0)
PCO2 VENOUS: 42 mmHg (ref 40–60)
PH VENOUS: 7.43 (ref 7.32–7.43)
PO2 VENOUS: 57 mmHg — ABNORMAL HIGH (ref 30–55)

## 2019-03-24 LAB — CBC W/ AUTO DIFF
BASOPHILS ABSOLUTE COUNT: 0.1 10*9/L (ref 0.0–0.1)
BASOPHILS RELATIVE PERCENT: 0.9 %
EOSINOPHILS ABSOLUTE COUNT: 0.4 10*9/L (ref 0.0–0.4)
EOSINOPHILS RELATIVE PERCENT: 3.7 %
HEMATOCRIT: 43.4 % (ref 41.0–53.0)
HEMOGLOBIN: 14.1 g/dL (ref 13.5–17.5)
LYMPHOCYTES ABSOLUTE COUNT: 2.3 10*9/L (ref 1.5–5.0)
LYMPHOCYTES RELATIVE PERCENT: 23.2 %
MEAN CORPUSCULAR HEMOGLOBIN CONC: 32.4 g/dL (ref 31.0–37.0)
MEAN CORPUSCULAR HEMOGLOBIN: 27.6 pg (ref 26.0–34.0)
MEAN CORPUSCULAR VOLUME: 85.2 fL (ref 80.0–100.0)
MEAN PLATELET VOLUME: 7 fL (ref 7.0–10.0)
MONOCYTES ABSOLUTE COUNT: 0.6 10*9/L (ref 0.2–0.8)
MONOCYTES RELATIVE PERCENT: 5.7 %
NEUTROPHILS ABSOLUTE COUNT: 6.4 10*9/L (ref 2.0–7.5)
NEUTROPHILS RELATIVE PERCENT: 64.6 %
RED BLOOD CELL COUNT: 5.09 10*12/L (ref 4.50–5.90)
RED CELL DISTRIBUTION WIDTH: 15.3 % — ABNORMAL HIGH (ref 12.0–15.0)
WBC ADJUSTED: 9.9 10*9/L (ref 4.5–11.0)

## 2019-03-24 LAB — PH VENOUS: pH:LsCnc:Pt:BldV:Qn:: 7.43

## 2019-03-24 LAB — ALT (SGPT): Alanine aminotransferase:CCnc:Pt:Ser/Plas:Qn:: 58 — ABNORMAL HIGH

## 2019-03-24 LAB — NEUTROPHILS RELATIVE PERCENT: Lab: 64.6

## 2019-03-24 NOTE — Unmapped (Addendum)
Pt with hx of cystic fibrosis and has been on oral antibiotics but thinks he may need IV fluids. Pt normal oxygen 90%-91%. Pt reports cough and increase SOB x 1 week

## 2019-03-24 NOTE — Unmapped (Signed)
[facility] Emergency Department Provider Note      ED Clinical Impression     Final diagnoses:   SOB (shortness of breath) (Primary)   Cystic fibrosis (CMS-HCC)     Initial Impression, ED Course, Assessment and Plan   Patient is a 33 y.o. male with a PMH of anxiety, depression, hypertension, diabetes, and cystic fibrosis presenting to the ED for evaluation of shortness of breath ongoing for the last 8 days with associated cough and fever in the setting of traveling outside of the state during COVID pandemic. PE notable for tachypnea, VS notable for mild tachycardia, afebrile.     Suspect acute on chronic CF flare. Will obtain basic labs including CXR, sputum culture, COVID PCR, and VBG. Anticipate admission.     20:19  Labs re-assuring, COVID negative, pt. Will need admission given outpatient failure of improvement on PO abx, ED admit decision placed, MAO paged.     Labs     Labs Reviewed   CF SPUTUM/CF SINUS CULTURE - Abnormal; Notable for the following components:       Result Value    CF Sputum Culture 3+ Smooth Pseudomonas aeruginosa (*)     All other components within normal limits    Narrative:     Specimen Source: Sputum   COMPREHENSIVE METABOLIC PANEL - Abnormal; Notable for the following components:    Chloride 97 (*)     Glucose 219 (*)     ALT 58 (*)     All other components within normal limits   BLOOD GAS, VENOUS - Abnormal; Notable for the following components:    pO2, Ven 57 (*)     Base Excess, Ven 3.6 (*)     O2 Saturation, Venous 87.7 (*)     All other components within normal limits   CBC - Abnormal; Notable for the following components:    HGB 13.2 (*)     HCT 39.2 (*)     All other components within normal limits   COMPREHENSIVE METABOLIC PANEL - Abnormal; Notable for the following components:    ALT 56 (*)     All other components within normal limits   POCT GLUCOSE, INTERFACED - Abnormal; Notable for the following components:    Glucose, POC 181 (*)     All other components within normal limits   POCT GLUCOSE, INTERFACED - Abnormal; Notable for the following components:    Glucose, POC 201 (*)     All other components within normal limits   CBC W/ AUTO DIFF - Abnormal; Notable for the following components:    RDW 15.3 (*)     All other components within normal limits    Narrative:     Please use the Absolute Differential for reference ranges.    COVID-19 PCR - Normal    Narrative:     --                  This test was performed using the Cepheid Xpert Xpress SARS-CoV-2 assay which has been validated by the CLIA-certified, CAP-inspected [facility] Clinical Molecular Microbiology Laboratory and [facility]. FDA has granted Emergency Use Authorization for this test.                   This real-time RT-PCR test detects SARS-CoV-2 by targeting the N2 and E genes. Negative results do not preclude SARS-CoV-2 infection and should not be used as the sole basis for patient management decisions. Negative results  must be combined with clinical observations, patient history, and epidemiological information.                   Information for providers and patients can be found here: [email or web address]   POCT GLUCOSE, INTERFACED - Normal   POCT GLUCOSE, INTERFACED - Normal   POCT GLUCOSE, INTERFACED - Normal   POCT GLUCOSE, INTERFACED - Normal   POCT GLUCOSE, INTERFACED - Normal   POCT GLUCOSE, INTERFACED - Normal   POCT GLUCOSE, INTERFACED - Normal   POCT GLUCOSE, INTERFACED - Normal   POCT GLUCOSE, INTERFACED - Normal   POCT GLUCOSE, INTERFACED - Normal   POCT GLUCOSE, INTERFACED - Normal   POCT GLUCOSE, INTERFACED - Normal   POCT GLUCOSE, INTERFACED - Normal   AFB SMEAR   AFB CULTURE   CBC W/ DIFFERENTIAL    Narrative:     The following orders were created for panel order CBC w/ Differential.                  Procedure                               Abnormality         Status ---------                               -----------         ------                                     CBC w/ Differential[[id number]]         Abnormal            Final result                                                 Please view results for these tests on the individual orders.   VITAMIN K   BASIC METABOLIC PANEL       Radiology     Xr Chest Portable    Result Date: 03/24/2019  EXAM: XR CHEST PORTABLE DATE: 03/24/2019 5:52 PM ACCESSION: [id number] UN DICTATED: 03/24/2019 6:05 PM INTERPRETATION LOCATION: Main Campus     CLINICAL INDICATION: 33 years old Male with SHORTNESS OF BREATH      COMPARISON: Chest x-ray 01/20/2019     TECHNIQUE: Portable Chest Radiograph.     FINDINGS:     Unchanged right-sided Port-A-Cath with tip in the right atrium.     Lungs radiographically clear. Sequela of right upper lobectomy.     No pneumothorax. Unchanged blunting of the bilateral costophrenic angles compatible with pleural thickening.     Stable cardiomediastinal silhouette.                 Clear lungs    History   Chief Complaint  Shortness of Breath    HPI   Patient was seen by me at 4:39 PM.    Patient is a 33 y.o. male with a PMH of anxiety, depression, hypertension, diabetes, and cystic fibrosis presenting to the ED for evaluation of shortness of breath ongoing for the last 8 days with associated cough and fever in the  setting of traveling outside of the state during COVID pandemic.  Patient contacted his pulmonologist who instructed him to assess pulmonary function test at home which have been notable for steady decline despite patient's home compliance with Symbicort.  Pulmonology recommended increasing Symbicort to twice daily which patient endorses compliance without relief.  Pulmonology started patient on 14-day course of Cipro (history of Pseudomonas and stenotrophomonas in the past) beginning at 03/19/2019.  Patient with COVID tested at the RTC this morning, results are pending.    Of note patient also with non-tuberculosis mycobacteria positive sputum cultures in the past.    Previous chart, nursing notes, and vital signs reviewed.      Pertinent labs & imaging results that were available during my care of the patient were reviewed by me and considered in my medical decision making (see chart for details).    Past Medical History:   Diagnosis Date   ??? Anxiety    ??? Chronic pain disorder    ??? Cystic fibrosis (CMS-HCC)    ??? Depression    ??? Hypertension    ??? Nonproductive cough 04/05/2018       Past Surgical History:   Procedure Laterality Date   ??? PR REMOVAL OF LUNG,LOBECTOMY Right 03/29/2017    Procedure: REMOVAL OF LUNG, OTHER THAN PNEUMONECTOMY; SINGLE LOBE (LOBECTOMY);  Surgeon: Cherie Dark, MD;  Location: MAIN OR [facility];  Service: Thoracic         Current Facility-Administered Medications:   ???  acetaminophen (TYLENOL) tablet 1,000 mg, 1,000 mg, Oral, Q8H, Lynnell Jude, MD, 1,000 mg at 03/27/19 2209  ???  albuterol 2.5 mg /3 mL (0.083 %) nebulizer solution 2.5 mg, 2.5 mg, Nebulization, Q6H PRN, Peter Garter, MD  ???  amitriptyline (ELAVIL) tablet 100 mg, 100 mg, Oral, Nightly, Rayetta Pigg, MD, 100 mg at 03/27/19 2103  ???  cetirizine (ZyrTEC) tablet 10 mg, 10 mg, Oral, Daily, Peter Garter, MD, 10 mg at 03/28/19 0857  ???  cholecalciferol (vitamin D3) tablet 6,000 Units, 6,000 Units, Oral, Daily, Rayetta Pigg, MD, 6,000 Units at 03/28/19 0857  ???  dextrose 50 % in water (D50W) 50 % solution 12.5 g, 12.5 g, Intravenous, Q10 Min PRN, Peter Garter, MD  ???  dornase alfa (PULMOZYME) 1 mg/mL nebulizer solution 2.5 mg, 2.5 mg, Inhalation, Daily (RT), Peter Garter, MD, 2.5 mg at 03/26/19 0817  ???  FLUoxetine (PROzac) capsule 40 mg, 40 mg, Oral, Daily, Peter Garter, MD, 40 mg at 03/28/19 0857  ???  fluticasone furoate-vilanteroL (BREO ELLIPTA) 200-25 mcg/dose inhaler 1 puff, 1 puff, Inhalation, Daily (RT), Peter Garter, MD, 1 puff at 03/26/19 9811  ???  gabapentin (NEURONTIN) capsule 800 mg, 800 mg, Oral, Nightly, Peter Garter, MD, 800 mg at 03/27/19 2010  ???  insulin glargine (LANTUS) injection 70 Units, 70 Units, Subcutaneous, Daily, Peter Garter, MD, 70 Units at 03/27/19 1827  ???  insulin lispro (HumaLOG) injection 0-12 Units, 0-12 Units, Subcutaneous, ACHS, Peter Garter, MD, 4 Units at 03/28/19 0901  ???  insulin lispro (HumaLOG) injection 45 Units, 45 Units, Subcutaneous, TID AC, Peter Garter, MD, 45 Units at 03/28/19 0902  ???  lamoTRIgine (LaMICtal) tablet 200 mg, 200 mg, Oral, Daily, Peter Garter, MD, 200 mg at 03/28/19 0857  ???  lisinopriL (PRINIVIL,ZESTRIL) tablet 10 mg, 10 mg, Oral, Daily, Peter Garter, MD, 10 mg at 03/28/19 0857  ???  montelukast (SINGULAIR) tablet 10 mg, 10 mg, Oral, Nightly,  Peter Garter, MD, 10 mg at 03/27/19 2004  ???  MVW Complete (pediatric multivit 61-D3-vit K) 1,500-800 unit-mcg 2 capsule, 2 capsule, Oral, Daily, Rayetta Pigg, MD, 2 capsule at 03/28/19 0858  ???  oxyCODONE (ROXICODONE) immediate release tablet 5 mg, 5 mg, Oral, Q6H PRN, Rayetta Pigg, MD, 5 mg at 03/28/19 0857  ???  pancrelipase (Lip-Prot-Amyl) (CREON) 24,000-76,000 -120,000 unit delayed release capsule 168,000-192,000 units of lipase, 7-8 capsule, Oral, With snacks, Rayetta Pigg, MD  ???  pancrelipase (Lip-Prot-Amyl) (CREON) 24,000-76,000 -120,000 unit delayed release capsule 264,000-288,000 units of lipase, 11-12 capsule, Oral, 3xd Meals, Rayetta Pigg, MD, 264,000 units of lipase at 03/28/19 9562  ???  pantoprazole (PROTONIX) EC tablet 20 mg, 20 mg, Oral, Daily, Rayetta Pigg, MD, 20 mg at 03/28/19 0857  ???  piperacillin-tazobactam (ZOSYN) IVPB (premix) 4.5 g, 4.5 g, Intravenous, Q6H, Rayetta Pigg, MD, Last Rate: 200 mL/hr at 03/28/19 0611, 4.5 g at 03/28/19 1308  ???  polyethylene glycol (MIRALAX) packet 17 g, 17 g, Oral, BID PRN, Rayetta Pigg, MD  ???  pramipexole (MIRAPEX) tablet 0.25 mg, 0.25 mg, Oral, Daily, Peter Garter, MD, 0.25 mg at 03/28/19 0857  ???  rivaroxaban (XARELTO) tablet 20 mg, 20 mg, Oral, Daily, Peter Garter, MD, 20 mg at 03/27/19 1133  ???  sodium chloride 7% nebulizer solution 4 mL, 4 mL, Nebulization, BID (RT), Peter Garter, MD, 4 mL at 03/26/19 2106  ???  tezacaftor 100mg /ivacaftor 150mg  and ivacaftor 150mg  (SYMDEKO) tablets, , Oral, BID, Phillips Grout, MD, 1 tablet at 03/28/19 6578  ???  tobramycin (PF) (TOBI) 300 mg/5 mL nebulizer solution 300 mg, 300 mg, Nebulization, BID (RT), Peter Garter, MD, 300 mg at 03/27/19 2042  ???  traZODone (DESYREL) tablet 200 mg, 200 mg, Oral, Nightly, Phillips Grout, MD, 200 mg at 03/27/19 2010    Allergies  Cayston [aztreonam lysine], Cefepime, Other, Slo-bid 100, Banana, and Tobramycin    Family History   Problem Relation Age of Onset   ??? Bipolar disorder Mother    ??? Depression Mother        Social History  Social History     Tobacco Use   ??? Smoking status: Never Smoker   ??? Smokeless tobacco: Never Used   Substance Use Topics   ??? Alcohol use: Yes     Alcohol/week: 3.0 standard drinks     Types: 3 Glasses of wine per week     Comment: 3 bottles of wine 2 days ago   ??? Drug use: No       Review of Systems    Constitutional: Negative for fever or chills. Negative for wt. loss.  Eyes: Negative for visual changes, erythema, drainage.  ENT: Negative for ear pain, loss of hearing. Negative for rhinorrhea or epistaxis. Negative for sore throat, trismus, or hoarse voice.  Cardiovascular: Negative for chest pain or palpitations.  Respiratory: Positive for shortness of breath or cough.  Gastrointestinal: Negative for abdominal pain, nausea, vomiting, constipation or diarrhea.  Genitourinary: Negative for dysuria, urgency, frequency, hesitancy, hematuria.   Musculoskeletal: Negative for back pain. Negative for neck pain. Negative for joint pain or swelling.  Skin: Negative for rash. Negative for pallor. Negative for diaphoresis.  Neurological: Negative for headaches, weakness, AMS, LOC, dizziness, or numbness.  Psych: Negative for SI, HI, A/V Hallucinations. Negative for agitation.     Physical Exam     VITAL SIGNS:    Vitals:    03/27/19  1610 03/27/19 0935 03/27/19 1940 03/27/19 2042   BP:  131/82 138/75    Pulse: 107 106 110 107   Resp: (!) 35 29 20 20    Temp:   36.9 ??C (98.4 ??F)    TempSrc:   Oral    SpO2:  94% 94% 94%   Weight:       Height:         Constitutional: Alert and oriented. Well appearing and in no distress.  Eyes: Conjunctivae are normal. PERRL. EOMs intact.        Head: Normocephalic and atraumatic.       Ear: EACs without exudate or erythema. TMs without erythema or effusion       Nose: No congestion. No epistaxis       Mouth/Throat: Mucous membranes are moist without lesions/ulcerations. Posterior Oropharynx patent. Tonsils without erythema or exudate. Uvula is midline. Soft Palate is soft without induration. Dentition intact without cracks/decay.       Neck: No stridor. Thyroid without nodules. Full ROM without pain. No spinous        Process tenderness. No nuchal rigidity.  Hematological/Lymphatic/Immunilogical: No cervical lymphadenopathy.  Cardiovascular: Normal rate, regular rhythm. Normal and symmetric distal pulses are present in all extremities. No gallops, murmurs, or clicks.  Respiratory: increased respiratory effort. Breath sounds are normal.  Gastrointestinal: Soft and nontender. BS active. No guarding or rigidity.  Musculoskeletal: Nontender joints and muscles with normal range of motion in all extremities.  Neurologic: Normal speech and language.   Steady gait. Able to walk on tiptoes and heels without difficulty.  Normal tandem gait.  Skin: Skin is warm, dry and intact. No rash noted. No pallor.  Psychiatric: Mood and affect are normal. Speech and behavior are normal.     [employer], AGNP  03/28/19 1031

## 2019-03-24 NOTE — Unmapped (Signed)
Assessment     Christian Young is a 33 y.o. male presenting to [facility] for COVID testing.     Plan     If no testing performed, pt counseled on routine care for respiratory illness.  If testing performed, COVID sent.  Patient directed to Home given findings during today's visit.    Subjective     Jamonta Goerner is a 33 y.o. male who presents to the [facility] with complaints of the following:    Exposure History: In the last 21 days?     Have you traveled outside of [place]? Yes               Have you been in close contact with someone confirmed by a test to have COVID? (Close contact is within 6 feet for at least 10 minutes) No       Have you worked in a health care facility? No     Lived or worked facility like a nursing home, group home, or assisted living?    No         Are you scheduled to have surgery or a procedure in the next 3 days? No               Are you scheduled to receive cancer chemotherapy within the next 7 days?    No     Have you ever been tested before for COVID-19 with a swab of your nose? Yes: When: 06/20, Where: [facility]   Are you a healthcare worker being tested so to return to work No         Right now,  do you have any of the following that developed over the past 7 days (as stated by patient on intake form):    Subjective fever (felt feverish) Yes, how many days? nt stated   Chills (especially repeated shaking chills) No   Severe fatigue (felt very tired) Yes, how many days? nt stated   Muscle aches Yes, how many days? nt stated   Runny nose Yes, how many days? nt stated   Sore throat No   Loss of taste or smell No   Cough (new onset or worsening of chronic cough) Yes, how many days? nt stated   Shortness of breath Yes, how many days? nt stated   Nausea or vomiting Yes, how many days? nt stated   Headache Yes, how many days? nt stated   Abdominal Pain No   Diarrhea (3 or more loose stools in last 24 hours) No History/Medical Conditions (as stated by patient on intake form):    Do you have any of the following:   Asthma or emphysema or COPD No   Cystic Fibrosis Yes   Diabetes Yes   High Blood Pressure  Yes   Cardiovascular Disease No   Chronic Kidney Disease No   Chronic Liver Disease No   Chronic blood disorder like Sickle Cell Disease  No   Weak immune system due to disease or medication Yes   Neurologic condition that limits movement  No   Developmental delay - Moderate to Severe  No   Recent (within past 2 weeks) or current Pregnancy No   Morbid Obesity (>100 pounds over ideal weight) No   Current Smoker No   Former Smoker No       Objective     Given above, testing performed: Yes    Testing Performed:  Test Specimen Type Sent to  COVID-19  [facility]       Scribe's Attestation: Paulita Fujita, FNP obtained and performed the history, physical exam and medical decision making elements that were entered into the chart.  Signed by Alleen Borne serving as Scribe, on 03/24/2019 2:31 PM      The documentation recorded by the scribe accurately reflects the service I personally performed and the decisions made by me. Aida Puffer, FNP  March 25, 2019 1:05 PM

## 2019-03-25 NOTE — Unmapped (Signed)
Care Management  Initial Transition Planning Assessment    CM met with patient in pt room.  Pt/visitors were not wearing hospital provided masks for the duration of the interaction with CM. CM was wearing hospital provided surgical mask and hospital provided eye protection.  CM was within 6 foot of the patient/visitors during this interaction.               General  Care Manager assessed the patient by : In person interview with patient  Orientation Level: Oriented X4  Who provides care at home?: N/A  Reason for referral: Discharge Planning   Type of Residence: Mailing Address:  [address]  [address]  [place] [address]  Contacts: Extended Emergency Contact Information  Primary Emergency Contact: Carmin [place]  Mobile Phone: [phone]  Relation: Spouse  Patient Phone Number: [phone] (home)         Medical Provider(s): [facility]  Reason for Admission: Admitting Diagnosis:  No admission diagnoses are documented for this encounter.  Past Medical History:   has a past medical history of Anxiety, Chronic pain disorder, Cystic fibrosis (CMS-HCC), Depression, Hypertension, and Nonproductive cough (04/05/2018).  Past Surgical History:   has a past surgical history that includes pr removal of lung,lobectomy (Right, 03/29/2017).   Previous admit date: 01/20/2019    Primary Insurance- Payor: Medical sales representative / Plan: [employer] CT GEN CHOICE FUND OA PLUS / Product Type: *No Product type* /   Secondary Insurance ??? None  Prescription Coverage ??? same as above  Preferred Pharmacy - [facility] [place],  - [address]  [facility]  [facility] [place], [place] - [address]    Transportation home: Private vehicle  Level of function prior to admission: Independent    Contact/Decision Maker  Extended Emergency Contact Information  Primary Emergency Contact: Griffin,Alyson   [place] Phone: [phone] Relation: Spouse    Legal Next of Kin / Guardian / POA / Advance Directives     Advance Directive (Medical Treatment)  Does patient have an advance directive covering medical treatment?: Patient has advance directive covering medical treatment, copy not in chart.    Health Care Decision Maker [HCDM] (Medical & Mental Health Treatment)  Healthcare Decision Maker: HCDM documented in the HCDM/Contact Info section.  Information offered on HCDM, Medical & Mental Health advance directives:: Other (Comment)    Patient Information  Lives with: Spouse/significant other, Parent    Type of Residence: Private residence     Location/Detail: [place], [place]    Support Systems: Family Members, Parent, Spouse    Responsibilities/Dependents at home?: No    Home Care services in place prior to admission?: No     Equipment Currently Used at Home: glucometer, respiratory supplies  Current HME Agency (Name/Phone #): Nebulizer and chest vest therapy    Currently receiving outpatient dialysis?: No    Financial Information    Need for financial assistance?: No    Social Determinants of Health  Social History     Socioeconomic History   ??? Marital status: Married     Spouse name: None   ??? Number of children: None   ??? Years of education: None   ??? Highest education level: None   Occupational History   ??? None   Social Needs   ??? Financial resource strain: Not hard at all   ??? Food insecurity  Worry: Patient refused     Inability: Patient refused   ??? Transportation needs     Medical: No     Non-medical: No   Tobacco Use   ??? Smoking status: Never Smoker   ??? Smokeless tobacco: Never Used   Substance and Sexual Activity   ??? Alcohol use: Yes     Alcohol/week: 3.0 standard drinks     Types: 3 Glasses of wine per week     Comment: 3 bottles of wine 2 days ago   ??? Drug use: No   ??? Sexual activity: Not Currently   Lifestyle   ??? Physical activity     Days per week: 0 days     Minutes per session: 0 min   ??? Stress: Only a little   Relationships   ??? Social Wellsite geologist on phone: Patient refused     Gets together: Patient refused     Attends religious service: Patient refused     Active member of club or organization: Patient refused     Attends meetings of clubs or organizations: Patient refused     Relationship status: Patient refused   Other Topics Concern   ??? None   Social History Narrative   ??? None     Housing/Utilities   ??? Within the past 12 months, have you ever stayed: outside, in a car, in a tent, in an overnight shelter, or temporarily in someone else's home (i.e. couch-surfing)? No    ??? Are you worried about losing your housing? No    ??? Within the past 12 months, have you been unable to get utilities (heat, electricity) when it was really needed? No      Literacy   ??? How often do you need to have someone help you when you read instructions, pamphlets, or other written material from your doctor or pharmacy? Never        Discharge Needs Assessment  Concerns to be Addressed: no discharge needs identified    Clinical Risk Factors: Multiple Diagnoses (Chronic)    Barriers to taking medications: No    Prior overnight hospital stay or ED visit in last 90 days: Yes    Readmission Within the Last 30 Days: no previous admission in last 30 days    Anticipated Changes Related to Illness: none    Equipment Needed After Discharge: none    Discharge Facility/Level of Care Needs: other (see comments)(Home with self-care)    Readmission  Risk of Unplanned Readmission Score: UNPLANNED READMISSION SCORE: 21%  Predictive Model Details           21% (Medium) Factors Contributing to Score   Calculated 03/25/2019 12:04 42% Number of active Rx orders is 63   [place] Risk of Unplanned Readmission Model 23% Number of hospitalizations in last year is 5     11% Number of ED visits in last six months is 2     7% Encounter of ten days or longer in last year is present     6% Imaging order is present in last 6 months     4% Active anticoagulant Rx order is present     2% Age is 33 2% Charlson Comorbidity Index is 2     Readmitted Within the Last 30 Days? (No if blank)   Patient at risk for readmission?: Yes    Discharge Plan  Screen findings are: Care Manager reviewed the plan of the patient's care with the Multidisciplinary Team. No discharge  planning needs identified at this time. Care Manager will continue to manage plan and monitor patient's progress with the team.    Expected Discharge Date:     Expected Transfer from Critical Care:      Patient and/or family were provided with choice of facilities / services that are available and appropriate to meet post hospital care needs?: Yes   List choices in order highest to lowest preferred, if applicable. : [facility] if [place] needed    Initial Assessment complete?: Yes

## 2019-03-25 NOTE — Unmapped (Addendum)
Cystic Fibrosis Nutrition Assessment    Inpatient: MD Consult this admission and related follow up  Primary Pulmonary Provider: [name]  ===================================================================  Christian Young is a 33 y.o. male admitted from the ED on 8/11 for CF exacerbation. COVID negative. PMH significant for CFRD, PI, DVT.  - home enzyme (Creon 36,000) not available on inpatient formulary. Suggest change to Creon 24,000 while admitted with adjustments below.   Christian Young reports so-so appetite; never eats well on first few days of admission d/t not feeling well. Requested chips and diet soda be delivered on his meal trays.   - paged team with recommendations  ===================================================================  INTERVENTION:    1. High Calorie High Protein Diet     2. Recommend check PT. If elevated start 5mg  phytonadione daily x 5 days, then recheck PT. If WNL start 5mg  phytonadione twice weekly while on IV antibiotics.  - defer to team if pt on anticoagulation for hx of DVT    3. Recommend change to enzyme regimen to reduce pill burden: Creon 24,000 x 11-12 w/ meals, 7-8 w/ snacks  - d/c home with Creon 36,000    4. Recommend CF vitamin regimen:  MVW Complete Formulation gel cap 2 daily, 6000 units of supplemental Vit D3 to provide 9000 total units of Vit D3  - d/c home with MVW D5000 gel cap 2 daily    5. Weigh patient twice weekly this admit    6. Consider acid reducer  - pt prescribed prilosec at home    7. Ordered snacks in computrition per pt request    8. Continue remainder of nutrition regimen:  - bowel regimen; miralax PRN  - insulin regimen    Inpatient:   Will follow up with patient per protocol: 1-2 times per week (and more frequent as indicated)  ===================================================================  ASSESSMENT:  Cystic Fibrosis Nutrition Category = Outstanding per BMI    Current diet is not appropriate for CF. Patient meeting goals for CF weight management. Enzyme dose is not within established guidelines. Patient would benefit from adjustment in enzyme regimen. Vitamin prescription is not appropriate to reach/maintain optimal fat soluble vitamin levels. Patient would benefit from change in vitamin regimen. Patient may benefit from vitamin K supplementation while on IV antibiotics. Bowel regimen is appropriate. Patint to benefit from adjustment/inititiation of acid reducer for GERD and enzyme activation. Insulin regimen is appropriate for carbohydrate intake. Patient on CFTR modulator is consuming adequate amounts of fat-containing foods with prescribed medication to optimize absorption.    ASPEN/AND Malnutrition Screening:  Patient does not meet ASPEN/AND criteria for malnutrition at this time.     Goals:  1. Meet estimated daily needs: 4049 kcal/day (3549 kcal/day if desiring weight loss), 146-195 g PRO/day, 3540 mL fluid/day      Calories estimated using: Cystic Fibrosis Conference Formula, protein per DRI x 1.5-2, fluid per Holilday Segar  2. Reach/maintain established goals for CF:                Adult - BMI 22 kg/m2 for CF females and 23 kg/m2 for CF males    Pediatric - BMI or wt/ht ratio > 50%ile for age  24. Normal fat-soluble vitamin levels: Vitamin A, Vitamin E and PT per lab range; Vitamin D 25OH total >30  4. Maintain glucose control. Carbohydrate content of diet should comprise 40-50% of total calorie needs, but carbohydrates are not restricted in this population.    5. Meet sodium needs for CF  ===================================================================  INPATIENT:  Christian Young  Christian Young is admitted with No admission diagnoses are documented for this encounter..    Current Nutrition Orders (inpatient):       Nutrition Orders   (From admission, onward)             Start     Ordered    03/24/19 2145  Nutrition Therapy General (Regular)  Effective now     Question:  Nutrition Therapy (T):  Answer:  General (Regular)    03/24/19 2144 CF Nutrition related medications (inpatient): Nutritionally relevant medications reviewed.   Insulin regimen  MVW complete formulation gel cap once daily  Creon 12,000 x 21 w/ meals, 3 w/ snacks  Symdeko  miralax PRN    CF Nutrition related labs (inpatient): reviewed from 8/11  ==================================================================  CLINICAL DATA:  Past Medical History:   Diagnosis Date   ??? Anxiety    ??? Chronic pain disorder    ??? Cystic fibrosis (CMS-HCC)    ??? Depression    ??? Hypertension    ??? Nonproductive cough 04/05/2018     Anthroprometric Evaluation:  Weight changes: if documented wts are accurate, patient has experienced ~9 lb (3.5%) weight gain over the past ~2 months    Last 5 Recorded Weights    03/24/19 2129   Weight: (!) 122 kg (269 lb 1.1 oz)       BMI Readings from Last 3 Encounters:   03/24/19 36.49 kg/m??   03/16/19 35.67 kg/m??   01/20/19 35.26 kg/m??     Wt Readings from Last 3 Encounters:   03/24/19 (!) 122 kg (269 lb 1.1 oz)   03/16/19 (!) 119.3 kg (263 lb)   01/20/19 (!) 117.9 kg (260 lb)     Ht Readings from Last 3 Encounters:   03/24/19 182.9 cm (6')   03/16/19 182.9 cm (6')   01/20/19 182.9 cm (6')   ==================================================================  Energy Intake (outpatient):  Diet: pt endorses stable appetite & PO intake PTA. Appetite somewhat decreased now 2/2 not feeling well but pt suspects his intake will return to normal quickly. Denied need for supplements.   Food allergies: banana   Diet and CFTR modulators: Prescribed Symdeko (tezacaftor/ivacaftor).    PO Supplements: none  Patient resources for DME/formula: n/a  Appetite Stimulant: none  Enteral feeding tube: none  Physical activity: not discussed  Sodium in diet: Adequate from diet  Calcium in diet:  not discussed today    Fat Malabsorption (outpatient):  Enzyme brand, (meals/snacks):  Creon 36,000 @ 7/meal or 8/meal and 5/snack or 6/snack  Enzyme administration details: correct pre-meal administration., good compliance at all meals and snacks  Enzyme dose per MEAL (units lipase/kg/meal) 2065-2360  Enzyme dose per DAY (units lipase/kg/day) [address]454  GI meds:  Nutritionally relevant medications reviewed. Prilosec, probiotic.  Stools (steatorrhea): no sx reported  Stools (constipation): no s/s of constipation reported  GI symptoms:  none  Fecal Fat Studies: severe pancreatic insufficiency per fecal elastase  No results found for: [id number]  Lab Results   Component Value Date    ELAST <15 (L) 03/19/2017     No results found for: PELAI    Vitamins/Minerals (outpatient):  CF-specific MVI, dose, compliance: MVW Complete Formulation Softgel D-5000 2 daily, good compliance   - patient orders both probiotic and vitamin directly from MVW website with billing to [employer]. However, no orders billed since October 2019. Pt reports good compliance and adequate home supply.  Other vitamins/minerals/herbals: none  Patient Resources for vitamins: Lupita Shutter, phone [phone], active through September  2020 but account swept   Calcium supplement: none  Fat-soluble vitamin levels: annual labs completed at [facility] April 2020; all within normal limits.   Lab Results   Component Value Date    VITAMINA 44.4 11/19/2016     Lab Results   Component Value Date    CRP 24.9 (H) 08/15/2018    CRP 9.8 08/19/2017    CRP 7.7 11/26/2016     Lab Results   Component Value Date    VITDTOTAL 27.9 03/19/2017     No results found for: [facility]  Lab Results   Component Value Date    PT 18.7 (H) 01/29/2019    PT 17.6 (H) 11/18/2018    PT 14.2 (H) 08/13/2018    PT 12.9 (H) 08/19/2017    PT 11.1 03/29/2017     Lab Results   Component Value Date    DESGCARBPT 0.2 03/20/2017     No results found for: PIVKAII    Bone Health: Normal vitamin D level. Last DEXA normal.     CF Related Diabetes: yes, on insulin regimen  No results found for: GLUF  No results found for: GLUCOSE2HR  Lab Results   Component Value Date    A1C 8.5 (H) 08/12/2018    A1C 10.7 (H) 04/06/2018    A1C 9.0 (H) 01/07/2018     Jackqulyn Livings MPH, RD, LDN  Pager: [phone]

## 2019-03-25 NOTE — Unmapped (Signed)
Pt admitted from the ED. Pt alert and oriented x 4. Pt feeling drowsy and worried due to just receiving 45 units of Lispro/and blood sugar at this time was 134. Pt had not ate dinner yet. Pt immediately given some dinner and drink. Rechecked blood sugar at 2326 and it was 169. Pt also having pain in his chest and shoulder due to having coughing spells. MD ordered another Oxy 5 mg and it was given. Contact precautions initiated. Bedside table/call bell at bedside. Pt received all his evening medications with exception of Nebs in which he refused. Pt resting at this time. Will continue to monitor.  Problem: Adult Inpatient Plan of Care  Goal: Plan of Care Review  Outcome: Progressing  Goal: Patient-Specific Goal (Individualization)  Outcome: Progressing  Goal: Absence of Hospital-Acquired Illness or Injury  Outcome: Progressing  Goal: Optimal Comfort and Wellbeing  Outcome: Progressing  Goal: Readiness for Transition of Care  Outcome: Progressing  Goal: Rounds/Family Conference  Outcome: Progressing

## 2019-03-25 NOTE — Unmapped (Signed)
Patient with pain this AM in the chest and sides related to coughing to patient; received Iv morphine x1 per [facility] with relief. No other complaints. IV antibiotic given per MAR. Patient aware of plan for a line placement; unknown when.     Problem: Infection  Goal: Infection Symptom Resolution  Outcome: Ongoing - Unchanged     Problem: Diabetes Comorbidity  Goal: Blood Glucose Level Within Desired Range  Outcome: Progressing

## 2019-03-25 NOTE — Unmapped (Signed)
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 *    Christian Young is a 33 y.o. male with PMHx as noted below who presents to [facility] with Cystic fibrosis with pulmonary exacerbation (CMS-HCC).  ??  1. CF exacerbation w/ pulmonary manifestations w/ acute exacerbation of bronchiectasis:??Presented with increased cough and sputum production, SOB, as well as low grade fevers. ??COVID pcr is negative. ??His last sputum culture 11/20/2018 showed:??1+ Mucoid Pseudomonas??sensitive to Zosyn, Ceftaz, Aztreonam and??1+ Smooth Pseudomonas????intermediate sensitivity to Tobra and resistant to all else.   - Sputum Culture pending along with AFB (for atypical)  -??Continue??pip/tazo and inhaled tobramycin per primary pulmonologist  -??Continue??home??Symdeko   - Pulmozyme 2.5 mg BID  - montelukast 10 mg qHS  - cetirizine 10 mg daily  - Symbicort 2 puff BID??or formulary equivalent??  - HTS 7% QID w/ RT  - Aerobika and chest vest QID w/ RT  -Has lots of chest pain with exacerbations - oral oxycodone prn as other things have not helped.    ??  2. History of Mycobacterium abscessus infection:??Required RULobectomy in August 2018.??Previously??on clofazimine and azithromycin. Plan to repeat AFB smears every 3 months (starting February 2020).??  - AFB sputum culture pending  ??  3. CF-related PI  - Creon????  - MVW 2 capsules daily with additional vit D  ??  ??4. CF-related DM  Last A1c??8.5% (December)  - glargine 70 units qHS  - lispro??45??units TID AC (down from Carb counting dose of 60, increase as tolerated)  - SSI  ??  ??5. Depression, anxiety  -??Fluoxetine 40 mg daily  -??Lamotrigine 100 mg daily  ????  6. HTN  -??Lisinopril 10 mg daily  ??  7. History of DVT  -??Rivaroxaban 20 mg daily  ????  8. RLS  -??Pramipexole 0.25 mg daily  -??Gabapentin QHS to replace Horizant??  ??  FEN/GI/PPX  Reg diet  PO fluids  Xarelto   Please page [phone] with any questions.      Pablo Ledger, MD    Subjective:      Interval History (03/25/19)  Today feeling ok, feels he needs to stay for full 2 weeks of IVs in house.       Objective:      Physical Exam:  Vitals:    03/24/19 2104 03/24/19 2129 03/25/19 0428 03/25/19 0756   BP: 112/69 141/91 131/71 126/60   Pulse: 93 97 98 109   Resp: 18 18 18 18    Temp: 36.4 ??C (97.6 ??F) 36.3 ??C (97.3 ??F) 35.9 ??C (96.6 ??F) 36.6 ??C (97.9 ??F)   TempSrc: Oral Oral Oral    SpO2: 94% 94% 94% 93%   Weight:  (!) 122 kg (269 lb 1.1 oz)     Height:  182.9 cm (6')       General: Alert, well-appearing, and in no distress.  Eyes:  Anicteric sclera, conjunctiva clear.  ENT:  Mucous membranes moist and intact.  Lymph: No cervical or supraclavicular adenopathy.  Lungs: Normal excursion, no dullness to percussion. Good air movement bilaterally, without wheezes or crackles. Normal upper airway sounds without evidence of stridor.  Cardiovascular: Regular rate and rhythm, S1, S2 normal, no murmur, click, rub or gallop appreciated.  Abdomen: Soft, non-tender, not distended, bowel sounds are normal, liver is not enlarged, spleen is not enlarged  Musculoskeletal: No synovitis.  Skin: No rashes or lesions.  Neuro: No focal neurological deficits.    Malnutrition Assessment using AND/ASPEN Clinical Characteristics:            Patient Lines/Drains/Airways Status    Active Active Lines, Drains, & Airways     Name:   Placement date:   Placement time:   Site:   Days: Port A Cath 05/30/17 Right Chest   05/30/17    1746    Chest   663    Power Port--a-Cath Single Hub 10/05/15 Right Chest   10/05/15    2000    Chest   1266    Peripheral IV 03/24/19 Right Antecubital   03/24/19    1726    Antecubital   less than 1              Patient Lines/Drains/Airways Status    Active Wounds     Name:   Placement date:   Placement time:   Site:   Days:    Surgical Site 03/29/17 Chest Right   03/29/17    1346     725    Surgical Site Axilla Right   ???    ???                        Diagnostic Review:   All labs and images were personally reviewed.

## 2019-03-25 NOTE — Unmapped (Signed)
Admission scheduled: Admit from ED       Primary Pulmonary Physician: Dr Maia Plan    Reason for Admission: Rule of COVID and possible CF exacerbation    Recent fever low grade, cough with mucus, downtrending home spirometry, and 5 days of oral ciprofloxacin.    Antibiotics: Based on prior culture data (2 strains of pseudomonas) and allergy profile, favor treating with Zosyn .   Patient does not need azithromycin during admission.        Airway clearance: Airway clearance with hypertonic saline nebulized 2-3 times a day, pre-treatment with albuterol. For mechanical clearance, prefer Vest 4 times a day.  Pulmozyme .  Inhaled antibiotics - he is on inhaled tobramycin this month which should be continued    Consults: Pulmonary    Labs: Along with routine labs, please get AFB smear and culture    Imaging:  CT chest if found appropriate    Patient had AKI likely AIN from zosyn in an admission in the past. Adequate fluid hydration recommended with antibiotics.    Dispo: Patient does  need to remain in the hospital for the entire course of IV antibiotics. Patient does  need PFTs performed while in the hospital.       Maia Plan, MD  PGY 5, Pulmonary and Critical Care  Pager: [phone]  March 24, 2019 5:36 PM

## 2019-03-25 NOTE — Unmapped (Signed)
[facility]   History and Physical    Assessment/Plan:    Principal Problem:    Cystic fibrosis with pulmonary exacerbation (CMS-HCC)  Active Problems:    Essential hypertension    Depressive disorder    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)    Pancreatic insufficiency due to cystic fibrosis (CMS-HCC)    History of Mycobacterium abscessus infection    Bronchiectasis (CMS-HCC)    Chronic deep vein thrombosis (DVT) of lower extremity (CMS-HCC)    Restless leg syndrome      Christian Young is a 33 y.o. male with PMHx as noted below who presents to [facility] with Cystic fibrosis with pulmonary exacerbation (CMS-HCC).    1. CF exacerbation w/ pulmonary manifestations w/ acute exacerbation of bronchiectasis:??Presented with increased cough and sputum production, SOB, as well as low grade fevers.  COVID pcr is negative. ??His last sputum culture 11/20/2018 showed: 1+ Mucoid Pseudomonas sensitive to Zosyn, Ceftaz, Aztreonam and 1+ Smooth Pseudomonas  intermediate sensitivity to Tobra and resistant to all else.   - Sputum Culture pending  - Will initiate pip/tazo, continued inhaled tobramycin per pulmonologist  - Continue home??Symdeko if patient has home supply  - Pulmozyme 2.5 mg BID  - montelukast 10 mg qHS  - cetirizine 10 mg daily  - Symbicort 2 puff BID??or formulary equivalent??  - HTS 7% QID w/ RT  - Aerobika and chest vest QID w/ RT  ??  2. History of Mycobacterium abscessus infection:??Required RULobectomy in August 2018.??Previously??on clofazimine and azithromycin. Plan to repeat AFB smears every 3 months (starting February 2020).??  - AFB sputum culture pending  ??  3. CF-related PI  - Creon??7??capsules w/ meals, 4 w/ snacks  - MVW 2 capsules daily  ??  ??4. CF-related DM  Last A1c??8.5% (December)  - glargine 70 units qHS  - lispro??45 units TID AC (down from Carb counting dose of 60, increase as tolerated)  - SSI  ??  ??5. Depression, anxiety  - Fluoxetine 40 mg daily  - Lamotrigine 100 mg daily 6. HTN  - Lisinopril 10 mg daily  ??  7. History of DVT  - Rivaroxaban 20 mg daily  ????  8. RLS  - Pramipexole 0.25 mg daily  - Gabapentin QHS to replace Horizant??    FEN/GI/PPX  Reg diet  PO fluids  Xarelto  ___________________________________________________________________    Chief Complaint  Chief Complaint   Patient presents with   ??? Shortness of Breath       HPI:  Christian Young is a 33 y.o. male with PMHx as noted below who presents to [facility] with Cystic fibrosis with pulmonary exacerbation (CMS-HCC). Patient first noted some shortness of breath and cough about 3 weeks ago, shortly after returning from a trip to [place]. He has been in contact with his pulmonologist about this, and interventions such as improving compliance with Symbicort and more recently starting ciprofloxacin were made but have not been helpful. He has been having low grade fevers at home ad his PFTs have continued to worsen. COVID testing was arranged by his pulmonologist, and this was negative, but given fever, cough, chest pain and worsening pulmonary function tests he was instructed to come to the ED for admission (see phone note for more details). He says that the chest pain he [name] feeling is made worse with coughing. He does not have any known sick contacts but he notes his wife works with EMS so could have unknown exposures. He says his sputum  has been thicker in consistency since this all started, and has become dark green color. No blood in his sputum. No other infectious symptoms such as diarrhea, dysuria, rashes.     Allergies:  Cayston [aztreonam lysine], Cefepime, Other, Slo-bid 100, Banana, and Tobramycin     Medications:   Prior to Admission medications    Medication Dose, Route, Frequency   albuterol (PROVENTIL HFA;VENTOLIN HFA) 90 mcg/actuation inhaler 2 puffs, Inhalation, Every 6 hours PRN   albuterol 2.5 mg/0.5 mL nebulizer solution 2.5 mg, Nebulization, Every 6 hours PRN   amitriptyline (ELAVIL) 25 MG tablet 25 mg, Oral, Nightly   blood sugar diagnostic Strp Dispense 150 blood glucose test strips, ok to sub any brand preferred by insurance/patient, use up to 5x/day, dx E 08.9   blood-glucose meter kit Dispense meter that is preferred by patient's insurance company   blood-glucose meter,continuous (DEXCOM G6 RECEIVER) Misc Use as directed   blood-glucose sensor (DEXCOM G6 SENSOR) Devi Use sq as directed every 14 days   blood-glucose transmitter (DEXCOM G6 TRANSMITTER) Devi Use as directed   budesonide-formoterol (SYMBICORT) 160-4.5 mcg/actuation inhaler 2 puffs, Inhalation, 2 times a day (standard)   cetirizine (ZYRTEC) 10 MG tablet TAKE 1 TABLET (10 MG TOTAL) BY MOUTH DAILY.   ciprofloxacin HCl (CIPRO) 750 MG tablet 750 mg, Oral, 2 times a day (standard)   CREON 36,000-114,000- 180,000 unit CpDR TAKE 7 TO 8 CAPSULES WITH MEALS AND 5 TO 6 CAPSULES WITH SNACKS   diclofenac sodium (VOLTAREN) 1 % gel 2 g, Topical, 4 times a day  Patient not taking: Reported on 03/16/2019   dornase alfa (PULMOZYME) 1 mg/mL nebulizer solution 2.5 mg, Inhalation, Daily (standard)   EASY TOUCH LANCING DEVICE Misc Use as directed.   EASY TOUCH TWIST LANCETS 30 gauge Misc Use as directed.   FLUoxetine (PROZAC) 20 MG capsule 40 mg, Oral, Daily (standard)   gabapentin enacarbil (HORIZANT) 600 mg TbER 1,200 mg, Oral, Nightly   glucagon, human recombinant, (GLUCAGON) 1 mg/ml injection 1 mg, Subcutaneous, Once as needed, Follow package directions for low blood sugar.  Patient not taking: Reported on 03/16/2019   insulin ASPART (NOVOLOG FLEXPEN) 100 unit/mL (3 mL) injection pen Use up to 150 units/day, divided TID AC meals  Patient taking differently: 60-80 units TID AC   insulin glargine (BASAGLAR, LANTUS) 100 unit/mL (3 mL) injection pen 70 Units, Subcutaneous, Daily (standard)  Patient not taking: Reported on 03/16/2019   lamoTRIgine (LAMICTAL) 200 MG tablet 200 mg, Oral, Daily (standard)   lidocaine-prilocaine (EMLA) 2.5-2.5 % cream Topical, Every 12 hours lisinopriL (PRINIVIL,ZESTRIL) 10 MG tablet 10 mg, Oral, Daily (standard)   melatonin 10 mg Tab 10 mg, Oral, Nightly   montelukast (SINGULAIR) 10 mg tablet 10 mg, Oral, Nightly   MVW COMPLETE FORMUL PROBIOTIC 40 billion cell -15 mg CpDR 1 capsule, Oral, Daily (standard)   MVW Complete, pediatric multivit 61-D3-vit K, 1,500-800 unit-mcg cap 2 capsules, Oral, Daily (standard)   nebulizers Misc Use as directed with inhaled medications   omeprazole (PRILOSEC) 20 MG capsule 20 mg, Oral, Daily (standard)   pen needle, diabetic (BD ULTRA-FINE NANO PEN NEEDLE) 32 gauge x 5/32 Ndle ok to sub any brand or size needle preferred by insurance/patient, use up to 4x/day, dx E 08.9   pramipexole (MIRAPEX) 0.125 MG tablet 0.25 mg, Oral, Daily (standard)   sodium chloride 7% 7 % Nebu 4 mL, Nebulization, 2 times a day (standard), Increase to 4 times while taking antibiotics  Patient taking differently: Inhale 4 mL by  nebulization Two (2) times a day.    tezacaftor 100mg /ivacaftor 150mg  and ivacaftor 150mg  (SYMDEKO) tablets TAKE BY MOUTH AS DIRECTED ON PACKAGE LABELING   tobramycin (BETHKIS) 300 mg/4 mL Nebu 300 mg, Inhalation, Every 12 hours, For 28 days, alternating every other 28 days   traZODone (DESYREL) 100 MG tablet 200 mg, Oral, Nightly   XARELTO 20 mg tablet 20 mg, Oral, Daily (standard)   insulin detemir U-100 (LEVEMIR FLEXTOUCH U-100 INSULN) 100 unit/mL (3 mL) injection pen Use up to 30 units per day, as per MD instructions       Medical History:  Past Medical History:   Diagnosis Date   ??? Anxiety    ??? Chronic pain disorder    ??? Cystic fibrosis (CMS-HCC)    ??? Depression    ??? Hypertension    ??? Nonproductive cough 04/05/2018       Surgical History:  Past Surgical History:   Procedure Laterality Date   ??? PR REMOVAL OF LUNG,LOBECTOMY Right 03/29/2017    Procedure: REMOVAL OF LUNG, OTHER THAN PNEUMONECTOMY; SINGLE LOBE (LOBECTOMY);  Surgeon: Cherie Dark, MD;  Location: MAIN OR [facility];  Service: Thoracic       Social History: Social History     Social History Narrative   ??? Not on file     Social History     Tobacco Use   ??? Smoking status: Never Smoker   ??? Smokeless tobacco: Never Used   Substance Use Topics   ??? Alcohol use: Yes     Alcohol/week: 3.0 standard drinks     Types: 3 Glasses of wine per week     Comment: 3 bottles of wine 2 days ago   ??? Drug use: No       Family History:  Family History   Problem Relation Age of Onset   ??? Bipolar disorder Mother    ??? Depression Mother        Review of Systems:  10 systems reviewed and are negative unless otherwise mentioned in HPI    Physical Exam:  Temp:  [36.8 ??C (98.2 ??F)] 36.8 ??C (98.2 ??F)  SpO2 Pulse:  [117] 117  Resp:  [24] 24  BP: (157)/(91) 157/91  SpO2:  [96 %] 96 %  There is no height or weight on file to calculate BMI.    GEN: NAD, lying in bed.Marland Kitchen  EYES: EOMI, nonicteric sclerae.  ENT: no thyromegaly, no carotid bruits  CV: RRR, no murmurs, rubs or gallops appreciated  PULM: Good air movement. Faint crackles at the bases, No wheezes  ABD: soft, NT/ND, NABS, no hepatosplenomegaly.  EXT: No edema, no cyanosis or clubbing.  PSYCH: Appropriate  MSK: No spinal tenderness, No CVA tenderness, no joint effusions or deformities.  NEURO: No focal deficits.  CN II through XII are grossly intact.  Patient is alert and oriented ??3.    Test Results:  Recent Results (from the past 24 hour(s))   COVID-19 PCR    Collection Time: 03/24/19  5:19 PM    Specimen: Nasopharyngeal Swab   Result Value Ref Range    SARS-CoV-2 PCR Negative Negative   Comprehensive Metabolic Panel    Collection Time: 03/24/19  5:28 PM   Result Value Ref Range    Sodium 137 135 - 145 mmol/L    Potassium 4.2 3.5 - 5.0 mmol/L    Chloride 97 (L) 98 - 107 mmol/L    Anion Gap 15 7 - 15 mmol/L    CO2 25.0 22.0 -  30.0 mmol/L    BUN 12 7 - 21 mg/dL    Creatinine 1.47 8.29 - 1.30 mg/dL    BUN/Creatinine Ratio 15     EGFR CKD-EPI Non-African American, Male >90 >=60 mL/min/1.30m2    EGFR CKD-EPI African American, Male >90 >=60 mL/min/1.37m2    Glucose 219 (H) 70 - 179 mg/dL    Calcium 9.7 8.5 - 56.2 mg/dL    Albumin 4.4 3.5 - 5.0 g/dL    Total Protein 7.4 6.5 - 8.3 g/dL    Total Bilirubin 0.5 0.0 - 1.2 mg/dL    AST 47 19 - 55 U/L    ALT 58 (H) <50 U/L    Alkaline Phosphatase 96 38 - 126 U/L   CBC w/ Differential    Collection Time: 03/24/19  5:28 PM   Result Value Ref Range    WBC 9.9 4.5 - 11.0 10*9/L    RBC 5.09 4.50 - 5.90 10*12/L    HGB 14.1 13.5 - 17.5 g/dL    HCT 13.0 86.5 - 78.4 %    MCV 85.2 80.0 - 100.0 fL    MCH 27.6 26.0 - 34.0 pg    MCHC 32.4 31.0 - 37.0 g/dL    RDW 69.6 (H) 29.5 - 15.0 %    MPV 7.0 7.0 - 10.0 fL    Platelet 354 150 - 440 10*9/L    Neutrophils % 64.6 %    Lymphocytes % 23.2 %    Monocytes % 5.7 %    Eosinophils % 3.7 %    Basophils % 0.9 %    Absolute Neutrophils 6.4 2.0 - 7.5 10*9/L    Absolute Lymphocytes 2.3 1.5 - 5.0 10*9/L    Absolute Monocytes 0.6 0.2 - 0.8 10*9/L    Absolute Eosinophils 0.4 0.0 - 0.4 10*9/L    Absolute Basophils 0.1 0.0 - 0.1 10*9/L    Large Unstained Cells 2 0 - 4 %   Blood Gas, Venous    Collection Time: 03/24/19  5:28 PM   Result Value Ref Range    Specimen Source Venous     FIO2 Venous Room Air     pH, Venous 7.43 7.32 - 7.43    pCO2, Ven 42 40 - 60 mm Hg    pO2, Ven 57 (H) 30 - 55 mm Hg    HCO3, Ven 27 22 - 27 mmol/L    Base Excess, Ven 3.6 (H) -2.0 - 2.0    O2 Saturation, Venous 87.7 (H) 40.0 - 85.0 %       Imaging: Radiology studies were personally reviewed

## 2019-03-26 LAB — CBC
HEMATOCRIT: 39.2 % — ABNORMAL LOW (ref 41.0–53.0)
HEMOGLOBIN: 13.2 g/dL — ABNORMAL LOW (ref 13.5–17.5)
MEAN CORPUSCULAR HEMOGLOBIN CONC: 33.6 g/dL (ref 31.0–37.0)
MEAN CORPUSCULAR HEMOGLOBIN: 28.4 pg (ref 26.0–34.0)
MEAN CORPUSCULAR VOLUME: 84.7 fL (ref 80.0–100.0)
MEAN PLATELET VOLUME: 7.2 fL (ref 7.0–10.0)
PLATELET COUNT: 287 10*9/L (ref 150–440)
RED CELL DISTRIBUTION WIDTH: 14.9 % (ref 12.0–15.0)

## 2019-03-26 LAB — COMPREHENSIVE METABOLIC PANEL
ALBUMIN: 3.8 g/dL (ref 3.5–5.0)
ALKALINE PHOSPHATASE: 88 U/L (ref 38–126)
ALT (SGPT): 56 U/L — ABNORMAL HIGH (ref <50)
ANION GAP: 11 mmol/L (ref 7–15)
AST (SGOT): 52 U/L (ref 19–55)
BILIRUBIN TOTAL: 0.6 mg/dL (ref 0.0–1.2)
BLOOD UREA NITROGEN: 11 mg/dL (ref 7–21)
BUN / CREAT RATIO: 12
CALCIUM: 8.9 mg/dL (ref 8.5–10.2)
CHLORIDE: 98 mmol/L (ref 98–107)
CO2: 28 mmol/L (ref 22.0–30.0)
CREATININE: 0.9 mg/dL (ref 0.70–1.30)
EGFR CKD-EPI NON-AA MALE: >90 mL/min/{1.73_m2} (ref >=60)
GLUCOSE RANDOM: 127 mg/dL (ref 70–179)
POTASSIUM: 4.2 mmol/L (ref 3.5–5.0)
PROTEIN TOTAL: 6.5 g/dL (ref 6.5–8.3)
SODIUM: 137 mmol/L (ref 135–145)

## 2019-03-26 LAB — CREATININE: Creatinine:MCnc:Pt:Ser/Plas:Qn:: 0.9

## 2019-03-26 LAB — RED BLOOD CELL COUNT: Lab: 4.63

## 2019-03-26 NOTE — Unmapped (Signed)
PULMONARY PROGRESS NOTE      Patient: Christian Young(Aug 10, 1986)  Reason for admission: Cystic fibrosis with pulmonary exacerbation (CMS-HCC)     Assessment and Recommendations:      Principal Problem:    Cystic fibrosis with pulmonary exacerbation (CMS-HCC)  Active Problems:    Essential hypertension    Depressive disorder    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)    Pancreatic insufficiency due to cystic fibrosis (CMS-HCC)    History of Mycobacterium abscessus infection    Bronchiectasis (CMS-HCC)    Chronic deep vein thrombosis (DVT) of lower extremity (CMS-HCC)    Restless leg syndrome  Resolved Problems:    * No resolved hospital problems. *    Christian Young is a 33 y.o. male with PMHx as noted below who presents to [facility] with Cystic fibrosis with pulmonary exacerbation (CMS-HCC).  ??  1. CF exacerbation w/ pulmonary manifestations w/ acute exacerbation of bronchiectasis:??Presented with increased cough and sputum production, SOB, as well as low grade fevers. ??COVID pcr is negative. ??His last sputum culture 11/20/2018 showed:??1+ Mucoid Pseudomonas??sensitive to Zosyn, Ceftaz, Aztreonam and??1+ Smooth Pseudomonas????intermediate sensitivity to Tobra and resistant to all else.   - Sputum Culture pending along with AFB (for atypical)  -??Continue??pip/tazo and inhaled tobramycin per primary pulmonologist  -??Continue??home??Symdeko   - Pulmozyme 2.5 mg BID  - montelukast 10 mg qHS  - cetirizine 10 mg daily  - Symbicort 2 puff BID??or formulary equivalent??  - HTS 7% QID w/ RT  - Aerobika and chest vest QID w/ RT  -Has lots of chest pain with exacerbations - oral oxycodone prn as other things have not helped.    - Port in right chest not working since June, plan for IR to replace tomorrow, appreciate??assistance    2. History of Mycobacterium abscessus infection:??Required RULobectomy in August 2018.??Previously??on clofazimine and azithromycin. Plan to repeat AFB smears every 3 months (starting February 2020).??  - AFB sputum culture pending  ??  3. CF-related PI  - Creon????  - MVW 2 capsules daily with additional vit D  ??  ??4. CF-related DM  Last A1c??8.5% (December)  - glargine 70 units qHS  - lispro??45??units TID AC (down from Carb counting dose of 60, increase as tolerated)  - SSI  ??  ??5. Depression, anxiety  -??Fluoxetine 40 mg daily  -??Lamotrigine 100 mg daily  ????  6. HTN  -??Lisinopril 10 mg daily  ??  7. History of DVT  -??Rivaroxaban 20 mg daily  ????  8. RLS  -??Pramipexole 0.25 mg daily  -??Gabapentin QHS to replace Horizant??  ??  FEN/GI/PPX  Reg diet  PO fluids  Xarelto   Please page [phone] with any questions.      Pablo Ledger, MD    Subjective:      Interval History (03/26/19)  Today feels pain is better controlled, no complaints       Objective:      Physical Exam:  Vitals:    03/25/19 2000 03/25/19 2152 03/26/19 0402 03/26/19 0852   BP: 119/72  117/73 125/69   Pulse: 112 111 101 107   Resp: 18 18 16 19    Temp: 37.3 ??C (99.1 ??F)  36.2 ??C (97.2 ??F) 35.9 ??C (96.6 ??F)   TempSrc: Oral  Axillary    SpO2: 90% 99% 100% 95%   Weight:       Height:         General: Alert, well-appearing, and in no distress.  Eyes: Anicteric sclera,  conjunctiva clear.  ENT:  Mucous membranes moist and intact.  Lymph: No cervical or supraclavicular adenopathy.  Lungs: Normal excursion, no dullness to percussion. Good air movement bilaterally, without wheezes or crackles. Normal upper airway sounds without evidence of stridor.  Cardiovascular: Regular rate and rhythm, S1, S2 normal, no murmur, click, rub or gallop appreciated.  Abdomen: Soft, non-tender, not distended, bowel sounds are normal, liver is not enlarged, spleen is not enlarged  Musculoskeletal: No synovitis.  Skin: No rashes or lesions.  Neuro: No focal neurological deficits.    Malnutrition Assessment using AND/ASPEN Clinical Characteristics:           Patient Lines/Drains/Airways Status    Active Active Lines, Drains, & Airways     Name:   Placement date:   Placement time:   Site:   Days:    Port A Cath 05/30/17 Right Chest   05/30/17    1746    Chest   664    Power Port--a-Cath Single Hub 10/05/15 Right Chest   10/05/15    2000    Chest   1267    Peripheral IV 03/24/19 Right Antecubital   03/24/19    1726    Antecubital   1              Patient Lines/Drains/Airways Status    Active Wounds     Name:   Placement date:   Placement time:   Site:   Days:    Surgical Site 03/29/17 Chest Right   03/29/17    1346     726    Surgical Site Axilla Right   ???    ???                        Diagnostic Review:   All labs and images were personally reviewed.

## 2019-03-26 NOTE — Unmapped (Signed)
Stable on room air. Breathing treatments done as ordered. Offered Metaneb, But patient does not want metaneb. Home chest vest done for airway clearance

## 2019-03-26 NOTE — Unmapped (Signed)
Pt calm and cooperative. Pt compliant with medications. Vitals stable. Antibiotics continued. Will continue to monitior.  Problem: Adult Inpatient Plan of Care  Goal: Plan of Care Review  Outcome: Progressing  Goal: Patient-Specific Goal (Individualization)  Outcome: Progressing  Goal: Absence of Hospital-Acquired Illness or Injury  Outcome: Progressing  Goal: Optimal Comfort and Wellbeing  Outcome: Progressing  Goal: Readiness for Transition of Care  Outcome: Progressing  Goal: Rounds/Family Conference  Outcome: Progressing     Problem: Diabetes Comorbidity  Goal: Blood Glucose Level Within Desired Range  Outcome: Progressing     Problem: Hypertension Comorbidity  Goal: Blood Pressure in Desired Range  Outcome: Progressing     Problem: Wound  Goal: Optimal Wound Healing  Outcome: Progressing     Problem: Infection  Goal: Infection Symptom Resolution  Outcome: Progressing

## 2019-03-26 NOTE — Unmapped (Signed)
Patient is alert and oriented x 3, self care. Vss, afebrile, no s/s of infection. Gave oxycodone once for chest pain r/t coughing. Continues on IV antibiotics.  Monitoring BG and administering insulin as indicated. Will continue with the plan of care.   Problem: Adult Inpatient Plan of Care  Goal: Plan of Care Review  Outcome: Progressing  Goal: Patient-Specific Goal (Individualization)  Outcome: Progressing  Goal: Absence of Hospital-Acquired Illness or Injury  Outcome: Progressing  Goal: Optimal Comfort and Wellbeing  Outcome: Progressing  Goal: Readiness for Transition of Care  Outcome: Progressing  Goal: Rounds/Family Conference  Outcome: Progressing     Problem: Diabetes Comorbidity  Goal: Blood Glucose Level Within Desired Range  Outcome: Progressing     Problem: Hypertension Comorbidity  Goal: Blood Pressure in Desired Range  Outcome: Progressing     Problem: Wound  Goal: Optimal Wound Healing  Outcome: Progressing     Problem: Infection  Goal: Infection Symptom Resolution  Outcome: Progressing

## 2019-03-27 NOTE — Unmapped (Signed)
PULMONARY PROGRESS NOTE      Patient: Christian Young(Mar 06, 1986)  Reason for admission: Cystic fibrosis with pulmonary exacerbation (CMS-HCC)     Assessment and Recommendations:      Principal Problem:    Cystic fibrosis with pulmonary exacerbation (CMS-HCC)  Active Problems:    Essential hypertension    Depressive disorder    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)    Pancreatic insufficiency due to cystic fibrosis (CMS-HCC)    History of Mycobacterium abscessus infection    Bronchiectasis (CMS-HCC)    Chronic deep vein thrombosis (DVT) of lower extremity (CMS-HCC)    Restless leg syndrome  Resolved Problems:    * No resolved hospital problems. *    Christian Young is a 33 y.o. male with PMHx as noted below who presents to [facility] with Cystic fibrosis with pulmonary exacerbation (CMS-HCC).  ??  1. CF exacerbation w/ pulmonary manifestations w/ acute exacerbation of bronchiectasis:??Presented with increased cough and sputum production, SOB, as well as low grade fevers. ??COVID pcr is negative. ??His last sputum culture 11/20/2018 showed:??1+ Mucoid Pseudomonas??sensitive to Zosyn, Ceftaz, Aztreonam and??1+ Smooth Pseudomonas????intermediate sensitivity to Tobra and resistant to all else.   - Sputum Culture with pseudomonas  -??Continue??pip/tazo and inhaled tobramycin per primary pulmonologist  -??Continue??home??Symdeko   - Pulmozyme 2.5 mg BID  - montelukast 10 mg qHS  - cetirizine 10 mg daily  - Symbicort 2 puff BID??or formulary equivalent??  - HTS 7% QID w/ RT  - Aerobika and chest vest QID w/ RT  -Has lots of chest pain with exacerbations - oral oxycodone prn as other things have not helped.    - Port replaced by IR    2. History of Mycobacterium abscessus infection:??Required RULobectomy in August 2018.??Previously??on clofazimine and azithromycin. Plan to repeat AFB smears every 3 months (starting February 2020).??  - AFB sputum culture negative  ??  3. CF-related PI  - Creon????  - MVW 2 capsules daily with additional vit D  ??  ??4. CF-related DM  Last A1c??8.5% (December)  - glargine 70 units qHS  - lispro??45??units TID AC (down from Carb counting dose of 60, increase as tolerated)  - SSI  ??  ??5. Depression, anxiety  -??Fluoxetine 40 mg daily  -??Lamotrigine 100 mg daily  ????  6. HTN  -??Lisinopril 10 mg daily  ??  7. History of DVT  -??Rivaroxaban 20 mg daily  ????  8. RLS  -??Pramipexole 0.25 mg daily  -??Gabapentin QHS to replace Horizant??  ??  FEN/GI/PPX  Reg diet  PO fluids  Xarelto   Please page [phone] with any questions.      Pablo Ledger, MD    Subjective:      Interval History (03/27/19)  Today got port replaced.  Request pain meds for this as pretty sore.       Objective:      Physical Exam:  Vitals:    03/27/19 0925 03/27/19 0930 03/27/19 0934 03/27/19 0935   BP: 134/80 140/79  131/82   Pulse: 107 105 107 106   Resp: (!) 32 (!) 35 (!) 35 29   Temp:       TempSrc:       SpO2: 93% 93%  94%   Weight:       Height:         General: Alert, well-appearing, and in no distress.  Eyes: Anicteric sclera, conjunctiva clear.  ENT:  Mucous membranes moist and intact.  Lymph: No cervical or supraclavicular  adenopathy.  Lungs: Normal excursion, no dullness to percussion. Good air movement bilaterally, without wheezes or crackles. Normal upper airway sounds without evidence of stridor.  Cardiovascular: Regular rate and rhythm, S1, S2 normal, no murmur, click, rub or gallop appreciated.  Abdomen: Soft, non-tender, not distended, bowel sounds are normal, liver is not enlarged, spleen is not enlarged  Musculoskeletal: No synovitis.  Skin: No rashes or lesions.  Neuro: No focal neurological deficits.    Malnutrition Assessment using AND/ASPEN Clinical Characteristics:           Patient Lines/Drains/Airways Status    Active Active Lines, Drains, & Airways     Name:   Placement date:   Placement time:   Site:   Days:    Port A Cath 05/30/17 Right Chest   05/30/17    1746    Chest   665    Power Port--a-Cath Single Hub 03/27/19 Left Internal jugular   03/27/19    0907    Internal jugular   less than 1              Patient Lines/Drains/Airways Status    Active Wounds     Name:   Placement date:   Placement time:   Site:   Days:    Surgical Site 03/29/17 Chest Right   03/29/17    1346     728    Surgical Site Axilla Right   ???    ???                        Diagnostic Review:   All labs and images were personally reviewed.

## 2019-03-27 NOTE — Unmapped (Signed)
Stable on room air. Breathing treatments done as ordered. Home chest vest done for airway clearance

## 2019-03-27 NOTE — Unmapped (Signed)
[facility] - Operative Note     VIR Post-Procedure Note    Procedure Name: Port removal and new port placement    Pre-Op Diagnosis: CF    Post-Op Diagnosis: Same as pre-operative diagnosis    VIR Providers  APP: Marilla Boddy, FNP-BC    Description of procedure: Successful removal of RIJ port and new placement of left IJ single lumen port.    Estimated Blood Loss: approximately 5 mL  Complications: None    See detailed procedure note with images in PACS [facility]).    The patient tolerated the procedure well without incident or complication and left the room in stable condition.    Alease Frame, FNP-BC  03/27/2019 9:39 AM

## 2019-03-27 NOTE — Unmapped (Signed)
Alert and oriented x4. Vss; afebrile. No resp distress to note. IV abx infused per order. Prn oxy for chest pain d/t coughing.  NPO after midnight for procedure. All needs met thus far. Will continue to monitor.    Problem: Adult Inpatient Plan of Care  Goal: Plan of Care Review  Outcome: Progressing  Goal: Patient-Specific Goal (Individualization)  Outcome: Progressing  Goal: Absence of Hospital-Acquired Illness or Injury  Outcome: Progressing  Goal: Optimal Comfort and Wellbeing  Outcome: Progressing  Goal: Readiness for Transition of Care  Outcome: Progressing  Goal: Rounds/Family Conference  Outcome: Progressing     Problem: Diabetes Comorbidity  Goal: Blood Glucose Level Within Desired Range  Outcome: Progressing     Problem: Hypertension Comorbidity  Goal: Blood Pressure in Desired Range  Outcome: Progressing     Problem: Hypertension Comorbidity  Goal: Blood Pressure in Desired Range  Outcome: Progressing     Problem: Infection  Goal: Infection Symptom Resolution  Outcome: Progressing

## 2019-03-27 NOTE — Unmapped (Signed)
Antibiotic Timeout Checklist  Indication for antibiotics: CF exacerbation  Antibiotic Start Date: 03/24/19  Current systemic antibiotics:  Anti-infectives (From admission, onward)    Start     Dose/Rate Route Frequency Ordered Stop    03/25/19 1600  piperacillin-tazobactam (ZOSYN) IVPB (premix) 4.5 g      4.5 g  200 mL/hr over 30 Minutes Intravenous Every 6 hours 03/25/19 1033 04/03/19 1759    03/24/19 2300  tobramycin (PF) (TOBI) 300 mg/5 mL nebulizer solution 300 mg      300 mg Nebulization 2 times daily (RT) 03/24/19 2144          Microbiology Results Available? Yes  Sensitivities Available? Still pending  Are antibiotics still indicated? YES  Is it appropriate to de-escalate? NO: on appropriate therapy  Is it appropriate to convert to PO therapy? NO  Today's antibiotic plan: No change: current antibiotics appropriate  Planned Antibiotic Duration: end on 04/07/19

## 2019-03-27 NOTE — Unmapped (Signed)
[facility] - Pre Procedure H/P      Assessment/Plan:    [name] is a 33 y.o. male who will undergo new port placement and port removal in Interventional Radiology.    --This procedure has been fully reviewed with the patient/patient???s authorized representative. The risks, benefits and alternatives have been explained, and the patient/patient???s authorized representative has consented to the procedure.  --The patient will accept blood products in an emergent situation.  --The patient does not have a Do Not Resuscitate order in effect.      HPI: Christian Young is a 33 y.o. male with history of CF with indwelling right chest port that is not working ( not flushing or able to aspirate).  Will plan for new port placement on the left and port removal of existing port.     Allergies:   Allergies   Allergen Reactions   ??? [place] [Aztreonam Lysine] Anaphylaxis   ??? Cefepime Itching and Nausea Only   ??? Other Anaphylaxis and Other (See Comments)     Other reaction(s): Other (See Comments)  Bananas: itchy throat  Slo Bid record from [employer] states anaphylaxis.????Pt states this was from childhood and does not know reaction.  Bananas, causes itchy throat   ??? Slo-Bid 100 Anaphylaxis   ??? Banana Itching and Nausea And Vomiting   ??? Tobramycin Tinnitus     From OSH record-documented as tinnitus but has received IV tobra with close monitoring.       Medications:  Xarelto    ASA Grade: ASA 3 - Patient with moderate systemic disease with functional limitations    PE:    Vitals:    03/26/19 2106   BP:    Pulse: 107   Resp: 18   Temp:    SpO2: 97%     General: male in NAD.  Airway assessment: Class 3 - Can visualize soft palate  Lungs: Respirations even and non labored    Christian Lammert, FNP-BC  03/27/2019 8:09 AM

## 2019-03-28 LAB — BASIC METABOLIC PANEL
ANION GAP: 10 mmol/L (ref 7–15)
BLOOD UREA NITROGEN: 11 mg/dL (ref 7–21)
BUN / CREAT RATIO: 14
CHLORIDE: 103 mmol/L (ref 98–107)
CO2: 24 mmol/L (ref 22.0–30.0)
CREATININE: 0.76 mg/dL (ref 0.70–1.30)
EGFR CKD-EPI AA MALE: >90 mL/min/{1.73_m2} (ref >=60)
GLUCOSE RANDOM: 135 mg/dL (ref 70–179)
POTASSIUM: 4.2 mmol/L (ref 3.5–5.0)
SODIUM: 137 mmol/L (ref 135–145)

## 2019-03-28 LAB — VITAMIN K: VITAMIN K1: 0.38 ng/mL

## 2019-03-28 LAB — VITAMIN K1: Phytonadione:MCnc:Pt:Ser/Plas:Qn:: 0.38

## 2019-03-28 LAB — EGFR CKD-EPI NON-AA MALE: Lab: >90

## 2019-03-28 NOTE — Unmapped (Signed)
Patient is alert and oriented x 4, self care. Vss, afebrile, no s/s of infection. Patient had a right port-a-cath removal and a new left port-a-cath inserted in VIR this morning. Morphine and oxycodone given for chest pain. Continues on IV antibiotics. Scheduled nebs.  Monitoring BG and administering insulin as indicated. Will continue with the plan of care.   Problem: Adult Inpatient Plan of Care  Goal: Plan of Care Review  Outcome: Progressing  Goal: Patient-Specific Goal (Individualization)  Outcome: Progressing  Goal: Absence of Hospital-Acquired Illness or Injury  Outcome: Progressing  Goal: Optimal Comfort and Wellbeing  Outcome: Progressing  Goal: Readiness for Transition of Care  Outcome: Progressing  Goal: Rounds/Family Conference  Outcome: Progressing     Problem: Diabetes Comorbidity  Goal: Blood Glucose Level Within Desired Range  Outcome: Progressing     Problem: Diabetes Comorbidity  Goal: Blood Glucose Level Within Desired Range  Outcome: Progressing     Problem: Hypertension Comorbidity  Goal: Blood Pressure in Desired Range  Outcome: Progressing     Problem: Wound  Goal: Optimal Wound Healing  Outcome: Progressing     Problem: Infection  Goal: Infection Symptom Resolution  Outcome: Progressing

## 2019-03-28 NOTE — Unmapped (Signed)
Patient did not receive breathing treatments today morning as he was not available. No other  Distress noted

## 2019-03-28 NOTE — Unmapped (Signed)
Pt alert and oriented. VSS. Pt c/o of severe pain in chest where port was accessed. MD notified. Pain controlled with IV morphine. abx administered per order. Continuing to monitor.    Problem: Adult Inpatient Plan of Care  Goal: Absence of Hospital-Acquired Illness or Injury  Outcome: Ongoing - Unchanged  Intervention: Identify and Manage Fall Risk  Flowsheets (Taken 03/28/2019 0512)  Safety Interventions:  ??? lighting adjusted for tasks/safety  ??? low bed  ??? nonskid shoes/slippers when out of bed  Intervention: Prevent Skin Injury  Flowsheets (Taken 03/28/2019 0512)  Pressure Reduction Techniques: frequent weight shift encouraged  Goal: Optimal Comfort and Wellbeing  Outcome: Ongoing - Unchanged  Intervention: Monitor Pain and Promote Comfort  Flowsheets (Taken 03/28/2019 0513)  Pain Management Interventions:  ??? care clustered  ??? pain management plan reviewed with patient/caregiver  ??? quiet environment facilitated  Intervention: Provide Person-Centered Care  Flowsheets (Taken 03/28/2019 0513)  Trust Relationship/Rapport:  ??? care explained  ??? choices provided  ??? emotional support provided  ??? empathic listening provided  ??? thoughts/feelings acknowledged  ??? reassurance provided  ??? questions encouraged  ??? questions answered     Problem: Diabetes Comorbidity  Goal: Blood Glucose Level Within Desired Range  Outcome: Ongoing - Unchanged  Intervention: Maintain Glycemic Control  Flowsheets (Taken 03/28/2019 0513)  Glycemic Management: blood glucose monitoring     Problem: Infection  Goal: Infection Symptom Resolution  Outcome: Ongoing - Unchanged  Intervention: Prevent or Manage Infection  Flowsheets (Taken 03/28/2019 0513)  Isolation Precautions: contact precautions maintained

## 2019-03-28 NOTE — Unmapped (Signed)
Pt refused mucus clearance this shift due to pain from port insertion, no complications or distress noted this shift.

## 2019-03-28 NOTE — Unmapped (Signed)
PULMONARY PROGRESS NOTE      Patient: Christian Young(Mar 09, 1986)  Reason for admission: Cystic fibrosis with pulmonary exacerbation (CMS-HCC)     Assessment and Recommendations:      Principal Problem:    Cystic fibrosis with pulmonary exacerbation (CMS-HCC)  Active Problems:    Essential hypertension    Depressive disorder    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)    Pancreatic insufficiency due to cystic fibrosis (CMS-HCC)    History of Mycobacterium abscessus infection    Bronchiectasis (CMS-HCC)    Chronic deep vein thrombosis (DVT) of lower extremity (CMS-HCC)    Restless leg syndrome  Resolved Problems:    * No resolved hospital problems. *    Christian Young is a 33 y.o. male with PMHx as noted below who presents to [facility] with Cystic fibrosis with pulmonary exacerbation (CMS-HCC).  ??  1. CF exacerbation w/ pulmonary manifestations w/ acute exacerbation of bronchiectasis:??Presented with increased cough and sputum production, SOB, as well as low grade fevers. ??COVID pcr is negative. ??His last sputum culture 11/20/2018 showed:??1+ Mucoid Pseudomonas??sensitive to Zosyn, Ceftaz, Aztreonam and??1+ Smooth Pseudomonas????intermediate sensitivity to Tobra and resistant to all else.   - Sputum Culture with pseudomonas  -??Continue??pip/tazo and inhaled tobramycin per primary pulmonologist  -??Continue??home??Symdeko   - Pulmozyme 2.5 mg BID  - montelukast 10 mg qHS  - cetirizine 10 mg daily  - Symbicort 2 puff BID??or formulary equivalent??  - HTS 7% QID w/ RT  - Aerobika and chest vest QID w/ RT  -Has lots of chest pain with exacerbations and port placement - oral oxycodone prn as other things have not helped.      2. History of Mycobacterium abscessus infection:??Required RULobectomy in August 2018.??Previously??on clofazimine and azithromycin. Plan to repeat AFB smears every 3 months (starting February 2020).??  - AFB sputum culture negative  ??  3. CF-related PI  - Creon????  - MVW 2 capsules daily with additional vit D  ??  ??4. CF-related DM  Last A1c??8.5% (December)  - glargine 70 units qHS  - lispro??55??units TID AC (down from Carb counting dose of 60, increased 8/15)  - SSI  ??  ??5. Depression, anxiety  -??Fluoxetine 40 mg daily  -??Lamotrigine 100 mg daily  ????  6. HTN  -??Lisinopril 10 mg daily  ??  7. History of DVT  -??Rivaroxaban 20 mg daily  ????  8. RLS  -??Pramipexole 0.25 mg daily  -??Gabapentin QHS to replace Horizant??  ??  FEN/GI/PPX  Reg diet  PO fluids  Xarelto   Please page [phone] with any questions.      Pablo Ledger, MD    Subjective:      Interval History (03/28/19)  Today reports still a lot of pain from port site. Feels breathing same.       Objective:      Physical Exam:  Vitals:    03/27/19 0935 03/27/19 1940 03/27/19 2042 03/28/19 1040   BP: 131/82 138/75     Pulse: 106 110 107 103   Resp: 29 20 20 18    Temp:  36.9 ??C (98.4 ??F)     TempSrc:  Oral     SpO2: 94% 94% 94% 95%   Weight:       Height:         General: Alert, well-appearing, and in no distress. RIght chest dressed at removed port site.  Left chest with new accessed port, no erythema  Eyes: Anicteric sclera, conjunctiva clear.  ENT:  Mucous membranes moist and intact.  Lymph: No cervical or supraclavicular adenopathy.  Lungs: Normal excursion, no dullness to percussion. Good air movement bilaterally, without wheezes or crackles. Normal upper airway sounds without evidence of stridor.  Cardiovascular: Regular rate and rhythm, S1, S2 normal, no murmur, click, rub or gallop appreciated.  Abdomen: Soft, non-tender, not distended, bowel sounds are normal, liver is not enlarged, spleen is not enlarged  Musculoskeletal: No synovitis.  Skin: No rashes or lesions.  Neuro: No focal neurological deficits.    Malnutrition Assessment using AND/ASPEN Clinical Characteristics:           Patient Lines/Drains/Airways Status    Active Active Lines, Drains, & Airways     Name:   Placement date:   Placement time:   Site:   Days:    Port A Cath 05/30/17 Right Chest   05/30/17    1746    Chest   666    Power Port--a-Cath Single Hub 03/27/19 Left Internal jugular   03/27/19    0907    Internal jugular   1              Patient Lines/Drains/Airways Status    Active Wounds     Name:   Placement date:   Placement time:   Site:   Days:    Surgical Site 03/29/17 Chest Right   03/29/17    1346     728    Surgical Site Axilla Right   ???    ???                        Diagnostic Review:   All labs and images were personally reviewed.

## 2019-03-29 NOTE — Unmapped (Signed)
Patient declined all inhaled medications except Tobramycin and Breo today due to pain from yesterday's port placement and concern for administration triggering cough.  He also declined airway clearance.

## 2019-03-29 NOTE — Unmapped (Signed)
PULMONARY PROGRESS NOTE      Patient: Christian Young(06/19/1986)  Reason for admission: Cystic fibrosis with pulmonary exacerbation (CMS-HCC)     Assessment and Recommendations:      Principal Problem:    Cystic fibrosis with pulmonary exacerbation (CMS-HCC)  Active Problems:    Essential hypertension    Depressive disorder    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)    Pancreatic insufficiency due to cystic fibrosis (CMS-HCC)    History of Mycobacterium abscessus infection    Bronchiectasis (CMS-HCC)    Chronic deep vein thrombosis (DVT) of lower extremity (CMS-HCC)    Restless leg syndrome  Resolved Problems:    * No resolved hospital problems. *    Christian Young is a 33 y.o. male with PMHx as noted below who presents to [facility] with Cystic fibrosis with pulmonary exacerbation (CMS-HCC).  ??  1. CF exacerbation w/ pulmonary manifestations w/ acute exacerbation of bronchiectasis:??Presented with increased cough and sputum production, SOB, as well as low grade fevers. ??COVID pcr is negative. ??His last sputum culture 11/20/2018 showed:??1+ Mucoid Pseudomonas??sensitive to Zosyn, Ceftaz, Aztreonam and??1+ Smooth Pseudomonas????intermediate sensitivity to Tobra and resistant to all else.   - Sputum Culture with pseudomonas  -??Continue??pip/tazo and inhaled tobramycin per primary pulmonologist  -??Continue??home??Symdeko   - Pulmozyme 2.5 mg BID  - montelukast 10 mg qHS  - cetirizine 10 mg daily  - Symbicort 2 puff BID??or formulary equivalent??  - HTS 7% QID w/ RT  - Aerobika and chest vest QID w/ RT  -Has lots of chest pain with exacerbations and port placement - oral oxycodone and tylenol prn as other things have not helped.      2. History of Mycobacterium abscessus infection:??Required RULobectomy in August 2018.??Previously??on clofazimine and azithromycin. Plan to repeat AFB smears every 3 months (starting February 2020).??  - AFB sputum culture negative  ??  3. CF-related PI  - Creon????  - MVW 2 capsules daily with additional vit D  ??  ??4. CF-related DM  Last A1c??8.5% (December)  - glargine 70 units qHS  - lispro??55??units TID AC (down from Carb counting dose of 60, increased 8/15)  - SSI  ??  ??5. Depression, anxiety  -??Fluoxetine 40 mg daily  -??Lamotrigine 100 mg daily  ????  6. HTN  -??Lisinopril 10 mg daily  ??  7. History of DVT  -??Rivaroxaban 20 mg daily  ????  8. RLS  -??Pramipexole 0.25 mg daily  -??Gabapentin QHS to replace Horizant??  ??  FEN/GI/PPX  Reg diet  PO fluids  Xarelto   Please page [phone] with any questions.      Pablo Ledger, MD    Subjective:      Interval History (03/29/19)  Today reports still a lot of pain from port site. Feels breathing same.       Objective:      Physical Exam:  Vitals:    03/28/19 1145 03/28/19 1919 03/28/19 2024 03/29/19 0954   BP: 127/67 142/87  138/71   Pulse: 102 107 110 68   Resp: 20 16 18 18    Temp: 35.9 ??C (96.6 ??F) 36.7 ??C (98.1 ??F)     TempSrc: Oral Oral     SpO2: 93% 95% 95% 93%   Weight:       Height:         General: Alert, well-appearing, and in no distress. RIght chest dressed at removed port site.  Left chest with new accessed port, no erythema  Eyes: Anicteric  sclera, conjunctiva clear.  ENT:  Mucous membranes moist and intact.  Lymph: No cervical or supraclavicular adenopathy.  Lungs: Normal excursion, no dullness to percussion. Good air movement bilaterally, without wheezes or crackles. Normal upper airway sounds without evidence of stridor.  Cardiovascular: Regular rate and rhythm, S1, S2 normal, no murmur, click, rub or gallop appreciated.  Abdomen: Soft, non-tender, not distended, bowel sounds are normal, liver is not enlarged, spleen is not enlarged  Musculoskeletal: No synovitis.  Skin: No rashes or lesions.  Neuro: No focal neurological deficits.    Malnutrition Assessment using AND/ASPEN Clinical Characteristics:           Patient Lines/Drains/Airways Status    Active Active Lines, Drains, & Airways     Name:   Placement date:   Placement time: Site:   Days:    Port A Cath 05/30/17 Right Chest   05/30/17    1746    Chest   667    Power Port--a-Cath Single Hub 03/27/19 Left Internal jugular   03/27/19    0907    Internal jugular   2              Patient Lines/Drains/Airways Status    Active Wounds     Name:   Placement date:   Placement time:   Site:   Days:    Surgical Site 03/29/17 Chest Right   03/29/17    1346     729    Surgical Site Axilla Right   ???    ???                        Diagnostic Review:   All labs and images were personally reviewed.

## 2019-03-29 NOTE — Unmapped (Signed)
Pt alert and oriented x4. VSS. Pain better controlled this shift. IV abx administered per order. Pt resting comfortably. Call light in reach. Will continue to monitor.     Problem: Adult Inpatient Plan of Care  Goal: Absence of Hospital-Acquired Illness or Injury  Outcome: Ongoing - Unchanged  Intervention: Identify and Manage Fall Risk  Flowsheets (Taken 03/29/2019 0050)  Safety Interventions:  ??? low bed  ??? fall reduction program maintained  ??? lighting adjusted for tasks/safety  Intervention: Prevent Skin Injury  Flowsheets (Taken 03/29/2019 0050)  Pressure Reduction Techniques: frequent weight shift encouraged  Intervention: Prevent VTE (venous thromboembolism)  Flowsheets (Taken 03/29/2019 0050)  VTE Prevention/Management:  ??? ambulation promoted  ??? fluids promoted  Intervention: Prevent Infection  Flowsheets (Taken 03/29/2019 0050)  Infection Prevention:  ??? rest/sleep promoted  ??? single patient room provided  Goal: Optimal Comfort and Wellbeing  Outcome: Ongoing - Unchanged  Intervention: Monitor Pain and Promote Comfort  Flowsheets (Taken 03/29/2019 0050)  Pain Management Interventions:  ??? care clustered  ??? quiet environment facilitated  Intervention: Provide Person-Centered Care  Flowsheets (Taken 03/29/2019 0050)  Trust Relationship/Rapport:  ??? care explained  ??? choices provided  ??? emotional support provided  ??? empathic listening provided  ??? thoughts/feelings acknowledged  ??? reassurance provided  ??? questions encouraged  ??? questions answered     Problem: Diabetes Comorbidity  Goal: Blood Glucose Level Within Desired Range  Outcome: Ongoing - Unchanged  Intervention: Maintain Glycemic Control  Flowsheets (Taken 03/29/2019 0050)  Glycemic Management: blood glucose monitoring     Problem: Hypertension Comorbidity  Goal: Blood Pressure in Desired Range  Outcome: Ongoing - Unchanged     Problem: Infection  Goal: Infection Symptom Resolution  Outcome: Ongoing - Unchanged

## 2019-03-29 NOTE — Unmapped (Signed)
Pt alert and oriented x4. Reports pain to L chest where new port was placed yesterday. PRN oxy given this morning with effect. Pt also on scheduled tylenol. IVABX given per order. Pt in NAD, VSS.      Problem: Adult Inpatient Plan of Care  Goal: Plan of Care Review  Outcome: Progressing  Goal: Patient-Specific Goal (Individualization)  Outcome: Progressing  Goal: Absence of Hospital-Acquired Illness or Injury  Outcome: Progressing  Goal: Optimal Comfort and Wellbeing  Outcome: Progressing  Goal: Readiness for Transition of Care  Outcome: Progressing  Goal: Rounds/Family Conference  Outcome: Progressing     Problem: Diabetes Comorbidity  Goal: Blood Glucose Level Within Desired Range  Outcome: Progressing     Problem: Hypertension Comorbidity  Goal: Blood Pressure in Desired Range  Outcome: Progressing     Problem: Wound  Goal: Optimal Wound Healing  Outcome: Progressing     Problem: Infection  Goal: Infection Symptom Resolution  Outcome: Progressing

## 2019-03-30 LAB — CBC W/ AUTO DIFF
BASOPHILS ABSOLUTE COUNT: 0.1 10*9/L (ref 0.0–0.1)
EOSINOPHILS RELATIVE PERCENT: 7.4 %
HEMATOCRIT: 41.2 % (ref 41.0–53.0)
HEMOGLOBIN: 13.2 g/dL — ABNORMAL LOW (ref 13.5–17.5)
LARGE UNSTAINED CELLS: 2 % (ref 0–4)
LYMPHOCYTES ABSOLUTE COUNT: 1.7 10*9/L (ref 1.5–5.0)
LYMPHOCYTES RELATIVE PERCENT: 20 %
MEAN CORPUSCULAR HEMOGLOBIN CONC: 32.2 g/dL (ref 31.0–37.0)
MEAN CORPUSCULAR HEMOGLOBIN: 27.4 pg (ref 26.0–34.0)
MEAN CORPUSCULAR VOLUME: 85 fL (ref 80.0–100.0)
MEAN PLATELET VOLUME: 6.9 fL — ABNORMAL LOW (ref 7.0–10.0)
MONOCYTES ABSOLUTE COUNT: 0.6 10*9/L (ref 0.2–0.8)
MONOCYTES RELATIVE PERCENT: 6.8 %
NEUTROPHILS ABSOLUTE COUNT: 5.3 10*9/L (ref 2.0–7.5)
NEUTROPHILS RELATIVE PERCENT: 62.7 %
PLATELET COUNT: 345 10*9/L (ref 150–440)
RED BLOOD CELL COUNT: 4.84 10*12/L (ref 4.50–5.90)
RED CELL DISTRIBUTION WIDTH: 14.9 % (ref 12.0–15.0)
WBC ADJUSTED: 8.5 10*9/L (ref 4.5–11.0)

## 2019-03-30 LAB — COMPREHENSIVE METABOLIC PANEL
ALBUMIN: 3.9 g/dL (ref 3.5–5.0)
ALKALINE PHOSPHATASE: 71 U/L (ref 38–126)
ALT (SGPT): 55 U/L — ABNORMAL HIGH (ref <50)
ANION GAP: 9 mmol/L (ref 7–15)
AST (SGOT): 36 U/L (ref 19–55)
BILIRUBIN TOTAL: 0.4 mg/dL (ref 0.0–1.2)
BUN / CREAT RATIO: 16
CALCIUM: 9.1 mg/dL (ref 8.5–10.2)
CHLORIDE: 102 mmol/L (ref 98–107)
CO2: 26 mmol/L (ref 22.0–30.0)
CREATININE: 0.8 mg/dL (ref 0.70–1.30)
EGFR CKD-EPI AA MALE: >90 mL/min/{1.73_m2} (ref >=60)
EGFR CKD-EPI NON-AA MALE: >90 mL/min/{1.73_m2} (ref >=60)
GLUCOSE RANDOM: 105 mg/dL (ref 70–179)
POTASSIUM: 4.1 mmol/L (ref 3.5–5.0)
PROTEIN TOTAL: 6.9 g/dL (ref 6.5–8.3)
SODIUM: 137 mmol/L (ref 135–145)

## 2019-03-30 LAB — GLUCOSE RANDOM: Glucose:MCnc:Pt:Ser/Plas:Qn:: 105

## 2019-03-30 LAB — LARGE UNSTAINED CELLS: Lab: 2

## 2019-03-30 NOTE — Unmapped (Signed)
Pt with no falls or injuries this shift, and able to ambulate independently in room.  Bed low and locked, side rails up x 2, and call bell within reach.  Vitals WNL.  Pt receiving IV antibiotics for CF exacerbation.  Pt's pain controlled with prn tylenol with adequate relief.  Pt tolerating diet.  Will continue to monitor.    Problem: Adult Inpatient Plan of Care  Goal: Plan of Care Review  Outcome: Progressing  Goal: Patient-Specific Goal (Individualization)  Outcome: Progressing  Goal: Absence of Hospital-Acquired Illness or Injury  Outcome: Progressing  Goal: Optimal Comfort and Wellbeing  Outcome: Progressing  Goal: Readiness for Transition of Care  Outcome: Progressing  Goal: Rounds/Family Conference  Outcome: Progressing     Problem: Diabetes Comorbidity  Goal: Blood Glucose Level Within Desired Range  Outcome: Progressing     Problem: Hypertension Comorbidity  Goal: Blood Pressure in Desired Range  Outcome: Progressing     Problem: Wound  Goal: Optimal Wound Healing  Outcome: Progressing     Problem: Infection  Goal: Infection Symptom Resolution  Outcome: Progressing

## 2019-03-30 NOTE — Unmapped (Signed)
Pt alert and oriented. VSS. No acute events overnight. Pain controlled with tylenol and oxycodone. IV abx administered per order. Morning labs drawn. Call light in reach. Will continue to monitor.     Problem: Adult Inpatient Plan of Care  Goal: Absence of Hospital-Acquired Illness or Injury  Outcome: Ongoing - Unchanged  Intervention: Identify and Manage Fall Risk  Flowsheets (Taken 03/30/2019 0155)  Safety Interventions:  ??? low bed  ??? fall reduction program maintained  ??? isolation precautions  ??? lighting adjusted for tasks/safety  Intervention: Prevent Skin Injury  Flowsheets (Taken 03/30/2019 0155)  Pressure Reduction Techniques: frequent weight shift encouraged  Intervention: Prevent VTE (venous thromboembolism)  Flowsheets (Taken 03/30/2019 0155)  VTE Prevention/Management:  ??? ambulation promoted  ??? fluids promoted  Intervention: Prevent Infection  Flowsheets (Taken 03/30/2019 0155)  Infection Prevention:  ??? personal protective equipment utilized  ??? visitors restricted/screened  ??? rest/sleep promoted  ??? single patient room provided  ??? handwashing promoted  Goal: Optimal Comfort and Wellbeing  Outcome: Ongoing - Unchanged  Intervention: Monitor Pain and Promote Comfort  Flowsheets (Taken 03/30/2019 0155)  Pain Management Interventions:  ??? care clustered  ??? pain management plan reviewed with patient/caregiver  Intervention: Provide Person-Centered Care  Flowsheets (Taken 03/30/2019 0155)  Trust Relationship/Rapport:  ??? care explained  ??? choices provided  ??? empathic listening provided  ??? thoughts/feelings acknowledged  ??? reassurance provided  ??? emotional support provided  ??? questions encouraged  ??? questions answered     Problem: Diabetes Comorbidity  Goal: Blood Glucose Level Within Desired Range  Outcome: Ongoing - Unchanged  Intervention: Maintain Glycemic Control  Flowsheets (Taken 03/30/2019 0155)  Glycemic Management: blood glucose monitoring     Problem: Infection  Goal: Infection Symptom Resolution  Outcome: Ongoing - Unchanged  Intervention: Prevent or Manage Infection  Flowsheets (Taken 03/30/2019 0155)  Isolation Precautions: contact precautions maintained

## 2019-03-30 NOTE — Unmapped (Signed)
PULMONARY PROGRESS NOTE      Patient: Christian Young(May 04, 1986)  Reason for admission: Cystic fibrosis with pulmonary exacerbation (CMS-HCC)     Assessment and Recommendations:      Principal Problem:    Cystic fibrosis with pulmonary exacerbation (CMS-HCC)  Active Problems:    Essential hypertension    Depressive disorder    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)    Pancreatic insufficiency due to cystic fibrosis (CMS-HCC)    History of Mycobacterium abscessus infection    Bronchiectasis (CMS-HCC)    Chronic deep vein thrombosis (DVT) of lower extremity (CMS-HCC)    Restless leg syndrome  Resolved Problems:    * No resolved hospital problems. *    Christian Young is a 33 y.o. male with PMHx as noted below who presents to [facility] with Cystic fibrosis with pulmonary exacerbation (CMS-HCC).  ??  1. CF exacerbation w/ pulmonary manifestations w/ acute exacerbation of bronchiectasis:??Presented with increased cough and sputum production, SOB, as well as low grade fevers. ??COVID pcr is negative. ??His last sputum culture 11/20/2018 showed:??1+ Mucoid Pseudomonas??sensitive to Zosyn, Ceftaz, Aztreonam and??1+ Smooth Pseudomonas????intermediate sensitivity to Tobra and resistant to all else.   - Sputum Culture with pseudomonas  -??Continue??pip/tazo and inhaled tobramycin per primary pulmonologist  -??Continue??home??Symdeko   - Pulmozyme 2.5 mg BID  - montelukast 10 mg qHS  - cetirizine 10 mg daily  - Symbicort 2 puff BID??or formulary equivalent??  - HTS 7% QID w/ RT  - Aerobika and chest vest QID w/ RT  -Has lots of chest pain with exacerbations and port placement - oral oxycodone and tylenol prn as other things have not helped.      2. History of Mycobacterium abscessus infection:??Required RULobectomy in August 2018.??Previously??on clofazimine and azithromycin. Plan to repeat AFB smears every 3 months (starting February 2020).??  - AFB sputum culture negative  ??  3. CF-related PI  - Creon????  - MVW 2 capsules daily with additional vit D  ??  ??4. CF-related DM  Last A1c??8.5% (December)  - glargine 70 units qHS  - lispro??55??units TID AC (down from Carb counting dose of 60, increased 8/15)  - SSI  ??  ??5. Depression, anxiety  -??Fluoxetine 40 mg daily  -??Lamotrigine 100 mg daily  ????  6. HTN  -??Lisinopril 10 mg daily  ??  7. History of DVT  -??Rivaroxaban 20 mg daily  ????  8. RLS  -??Pramipexole 0.25 mg daily  -??Gabapentin QHS to replace Horizant??  ??  FEN/GI/PPX  Reg diet  PO fluids  Xarelto   Please page [phone] with any questions.      Karie Soda, MD    Subjective:      Interval History (03/30/19)  Today reports that he feels much better overall. Breathing has improved. Some tenderness at port site but not too bad.        Objective:      Physical Exam:  Vitals:    03/29/19 1917 03/29/19 2013 03/30/19 0856 03/30/19 0953   BP: 157/90   134/87   Pulse: 107 107 90 99   Resp: 18 18 18 18    Temp: 36.2 ??C (97.2 ??F)   36.4 ??C (97.5 ??F)   TempSrc: Oral   Oral   SpO2: 97% 97% 97% 95%   Weight:       Height:         General: Alert, well-appearing, and in no distress. RIght chest dressed at removed port site.  Left chest with  new accessed port, no erythema  Eyes: Anicteric sclera, conjunctiva clear.  ENT:  Mucous membranes moist and intact.  Lymph: No cervical or supraclavicular adenopathy.  Lungs: Normal excursion, no dullness to percussion. Good air movement bilaterally, without wheezes or crackles. Normal upper airway sounds without evidence of stridor.  Cardiovascular: Regular rate and rhythm, S1, S2 normal, no murmur, click, rub or gallop appreciated.  Abdomen: Soft, non-tender, not distended, bowel sounds are normal, liver is not enlarged, spleen is not enlarged  Musculoskeletal: No synovitis.  Skin: No rashes or lesions.  Neuro: No focal neurological deficits.    Malnutrition Assessment using AND/ASPEN Clinical Characteristics:           Patient Lines/Drains/Airways Status    Active Active Lines, Drains, & Airways     Name: Placement date:   Placement time:   Site:   Days:    Port A Cath 05/30/17 Right Chest   05/30/17    1746    Chest   668    Power Port--a-Cath Single Hub 03/27/19 Left Internal jugular   03/27/19    0907    Internal jugular   3              Patient Lines/Drains/Airways Status    Active Wounds     Name:   Placement date:   Placement time:   Site:   Days:    Surgical Site 03/29/17 Chest Right   03/29/17    1346     730    Surgical Site Axilla Right   ???    ???                        Diagnostic Review:   All labs and images were personally reviewed.

## 2019-03-30 NOTE — Unmapped (Signed)
Pt with no falls or injuries this shift, and able to ambulate independently.  Bed low and locked, side rails up x 2, and call bell within reach.  Vitals WNL.  Pt receiving IV antibiotics for CF exacerbation.  Pt's pain controlled with prn oxycodone and one time dose oxy for pain at new port site.  Pt tolerating diet.  Will continue to monitor.    Problem: Adult Inpatient Plan of Care  Goal: Plan of Care Review  Outcome: Progressing  Goal: Patient-Specific Goal (Individualization)  Outcome: Progressing  Goal: Absence of Hospital-Acquired Illness or Injury  Outcome: Progressing  Goal: Optimal Comfort and Wellbeing  Outcome: Progressing  Goal: Readiness for Transition of Care  Outcome: Progressing  Goal: Rounds/Family Conference  Outcome: Progressing     Problem: Diabetes Comorbidity  Goal: Blood Glucose Level Within Desired Range  Outcome: Progressing     Problem: Hypertension Comorbidity  Goal: Blood Pressure in Desired Range  Outcome: Progressing     Problem: Wound  Goal: Optimal Wound Healing  Outcome: Progressing     Problem: Infection  Goal: Infection Symptom Resolution  Outcome: Progressing

## 2019-03-30 NOTE — Unmapped (Signed)
Pt refused hypertonic saline and mucus clearance due to pain at newly inserted port, pt states he will restart hypertonic saline and mucus clearance tomorrow, pt remains on RA, BBS clear with NPC, no complications or distress noted. RN notified.

## 2019-03-31 NOTE — Unmapped (Signed)
Problem: Adult Inpatient Plan of Care  Goal: Plan of Care Review  Outcome: Progressing  Goal: Patient-Specific Goal (Individualization)  Outcome: Progressing  Goal: Absence of Hospital-Acquired Illness or Injury  Outcome: Progressing  Goal: Optimal Comfort and Wellbeing  Outcome: Progressing  Goal: Readiness for Transition of Care  Outcome: Progressing  Goal: Rounds/Family Conference  Outcome: Progressing     Problem: Diabetes Comorbidity  Goal: Blood Glucose Level Within Desired Range  Outcome: Progressing     Problem: Hypertension Comorbidity  Goal: Blood Pressure in Desired Range  Outcome: Progressing     Problem: Wound  Goal: Optimal Wound Healing  Outcome: Progressing     Problem: Infection  Goal: Infection Symptom Resolution  Outcome: Progressing

## 2019-03-31 NOTE — Unmapped (Signed)
Pt alert and oriented x 4. Pt compliant with medications. Pt having some soreness and pain in chest. Oxy given. Antibiotics continued. Will continue to monitor.  Problem: Adult Inpatient Plan of Care  Goal: Plan of Care Review  Outcome: Progressing  Goal: Patient-Specific Goal (Individualization)  Outcome: Progressing  Goal: Absence of Hospital-Acquired Illness or Injury  Outcome: Progressing  Goal: Optimal Comfort and Wellbeing  Outcome: Progressing  Goal: Readiness for Transition of Care  Outcome: Progressing  Goal: Rounds/Family Conference  Outcome: Progressing     Problem: Diabetes Comorbidity  Goal: Blood Glucose Level Within Desired Range  Outcome: Progressing     Problem: Hypertension Comorbidity  Goal: Blood Pressure in Desired Range  Outcome: Progressing     Problem: Wound  Goal: Optimal Wound Healing  Outcome: Progressing     Problem: Infection  Goal: Infection Symptom Resolution  Outcome: Progressing

## 2019-04-01 NOTE — Unmapped (Signed)
Pt alert and oriented x 4, and able to ambulate independently.  Bedside table and call bell within reach.  Vitals WNL.  Pt receiving IV antibiotics for CF exacerbation.  Pt's pain controlled with oxycodone.  IV tubing changed.  Will continue to monitor.     Problem: Adult Inpatient Plan of Care  Goal: Plan of Care Review  Outcome: Progressing  Goal: Patient-Specific Goal (Individualization)  Outcome: Progressing  Goal: Absence of Hospital-Acquired Illness or Injury  Outcome: Progressing  Goal: Optimal Comfort and Wellbeing  Outcome: Progressing  Goal: Readiness for Transition of Care  Outcome: Progressing  Goal: Rounds/Family Conference  Outcome: Progressing     Problem: Diabetes Comorbidity  Goal: Blood Glucose Level Within Desired Range  Outcome: Progressing     Problem: Hypertension Comorbidity  Goal: Blood Pressure in Desired Range  Outcome: Progressing     Problem: Wound  Goal: Optimal Wound Healing  Outcome: Progressing     Problem: Infection  Goal: Infection Symptom Resolution  Outcome: Progressing

## 2019-04-01 NOTE — Unmapped (Signed)
Cystic Fibrosis Nutrition Assessment    Inpatient: MD Consult this admission and related follow up  Primary Pulmonary Provider: [name]  ===================================================================  Christian Young is a 33 y.o. male admitted from the ED on 8/11 for CF exacerbation. COVID negative. PMH significant for CFRD, PI, DVT.  - home enzyme (Creon 36,000) not available on inpatient formulary. On Creon 24,000 while admitted with adjustments below.   - [name] reports appetite is improved from admission.   ===================================================================  INTERVENTION:    1. Recommend check PT. If elevated start 5mg  phytonadione daily x 5 days, then recheck PT. If WNL start 5mg  phytonadione twice weekly while on IV antibiotics.  - defer to team if pt on anticoagulation for hx of DVT    2. Continue enzyme regimen: Creon 24,000 x 11-12 w/ meals, 7-8 w/ snacks  - d/c home with Creon 36,000    3. Continue CF vitamin regimen:  MVW Complete Formulation gel cap 2 daily, 6000 units of supplemental Vit D3 to provide 9000 total units of Vit D3  - d/c home with MVW D5000 gel cap 2 daily    4. Weigh patient twice weekly this admit    5. Continue remainder of nutrition regimen:  - High Calorie High Protein Diet   - bowel regimen; miralax PRN  - insulin regimen  - acid reducer    Inpatient:   Will follow up with patient per protocol: 1-2 times per week (and more frequent as indicated)  ===================================================================  ASSESSMENT:  Cystic Fibrosis Nutrition Category = Outstanding per BMI    Current diet is appropriate for CF. Current PO intake is improving and closer to meeting estimated CF needs. Patient meeting goals for CF weight management. Enzyme dose is within established guidelines. Vitamin prescription is appropriate to reach/maintain optimal fat soluble vitamin levels. Patient may benefit from vitamin K supplementation while on IV antibiotics. Bowel regimen is appropriate. Acid reducer appropriate for GERD and enzyme activation. Insulin regimen is appropriate for carbohydrate intake. Patient on CFTR modulator is consuming adequate amounts of fat-containing foods with prescribed medication to optimize absorption.    ASPEN/AND Malnutrition Screening:  Patient does not meet ASPEN/AND criteria for malnutrition at this time.   Nutrition focused physical exam not completed as visit today is via phone in an effort to minimize contact between patient and provider due to COVID-19 pandemic.    Goals:  1. Meet estimated daily needs: 4049 kcal/day (3549 kcal/day if desiring weight loss), 146-195 g PRO/day, 3540 mL fluid/day       Calories estimated using: Cystic Fibrosis Conference Formula, protein per DRI x 1.5-2, fluid per [place] Segar  2. Reach/maintain established goals for CF:                Adult - BMI 22 kg/m2 for CF females and 23 kg/m2 for CF males    Pediatric - BMI or wt/ht ratio > 50%ile for age  41. Normal fat-soluble vitamin levels: Vitamin A, Vitamin E and PT per lab range; Vitamin D 25OH total >30  4. Maintain glucose control. Carbohydrate content of diet should comprise 40-50% of total calorie needs, but carbohydrates are not restricted in this population.    5. Meet sodium needs for CF  ===================================================================  INPATIENT:   Current Nutrition Orders (inpatient):       Nutrition Orders   (From admission, onward)             Start     Ordered    03/27/19 1009  Nutrition  Therapy High Calorie High Protein  Effective now     Question:  Nutrition Therapy (T):  Answer:  High Calorie High Protein    03/27/19 1009              CF Nutrition related medications (inpatient): Nutritionally relevant medications reviewed.   6000 International units vitamin D3 daily  Insulin regimen - lantus, humalog  2 MVW complete formulation gel cap daily  Creon 24,000 (11-12 at meals, 7-8 at snacks) provides 2360 units lipase/kg/meal & 57846 units lipase/kg/day  protonix  Symdeko  miralax PRN    CF Nutrition related labs (inpatient):  Recent Labs   Lab Units 04/01/19  1224 04/01/19  1138 04/01/19  0753 03/31/19  2256 03/31/19  2213 03/31/19  1940 03/31/19  1701   POC GLUCOSE mg/dL 962* 952* 95 841 63* 324 104   ==================================================================  CLINICAL DATA:  Past Medical History:   Diagnosis Date   ??? Anxiety    ??? Chronic pain disorder    ??? Cystic fibrosis (CMS-HCC)    ??? Depression    ??? Hypertension    ??? Nonproductive cough 04/05/2018     Anthroprometric Evaluation:  Weight changes: If documented wts are accurate, patient has experienced ~9 lb (3.5%) weight gain over the past ~2 months    Last 5 Recorded Weights    03/24/19 2129   Weight: (!) 122 kg (269 lb 1.1 oz)     BMI Readings from Last 3 Encounters:   03/24/19 36.49 kg/m??   03/16/19 35.67 kg/m??   01/20/19 35.26 kg/m??     Wt Readings from Last 3 Encounters:   03/24/19 (!) 122 kg (269 lb 1.1 oz)   03/16/19 (!) 119.3 kg (263 lb)   01/20/19 (!) 117.9 kg (260 lb)     Ht Readings from Last 3 Encounters:   03/24/19 182.9 cm (6')   03/16/19 182.9 cm (6')   01/20/19 182.9 cm (6')   ==================================================================  Energy Intake (outpatient):  Diet: Pt endorses stable appetite & PO intake PTA. Appetite somewhat decreased now 2/2 not feeling well but pt suspects his intake will return to normal quickly. Denied need for supplements.   Food allergies: banana   Diet and CFTR modulators: Prescribed Symdeko (tezacaftor/ivacaftor).    PO Supplements: none  Patient resources for DME/formula: n/a  Appetite Stimulant: none  Enteral feeding tube: none  Physical activity: not discussed  Sodium in diet: Adequate from diet  Calcium in diet:  not discussed today    Fat Malabsorption (outpatient):  Enzyme brand, (meals/snacks):  Creon 36,000 @ 7/meal or 8/meal and 5/snack or 6/snack  Enzyme administration details: correct pre-meal administration., good compliance at all meals and snacks  Enzyme dose per MEAL (units lipase/kg/meal) 2065-2360  Enzyme dose per DAY (units lipase/kg/day) 40102-72536  GI meds:  Nutritionally relevant medications reviewed. Prilosec, probiotic.  Stools (steatorrhea): no sx reported  Stools (constipation): no s/s of constipation reported  GI symptoms:  none  Fecal Fat Studies: severe pancreatic insufficiency per fecal elastase  No results found for: [id number]  Lab Results   Component Value Date    ELAST <15 (L) 03/19/2017     No results found for: PELAI    Vitamins/Minerals (outpatient):  CF-specific MVI, dose, compliance: MVW Complete Formulation Softgel D-5000 2 daily, good compliance   - patient orders both probiotic and vitamin directly from MVW website with billing to [employer]. However, no orders billed since October 2019. Pt reports good compliance and adequate home supply.  Other  vitamins/minerals/herbals: none  Patient Resources for vitamins: Lupita Shutter, phone [phone], active through September 2020 but account swept   Calcium supplement: none  Fat-soluble vitamin levels: annual labs completed at [facility] April 2020; all within normal limits.   Lab Results   Component Value Date    VITAMINA 44.4 11/19/2016     Lab Results   Component Value Date    CRP 24.9 (H) 08/15/2018    CRP 9.8 08/19/2017    CRP 7.7 11/26/2016     Lab Results   Component Value Date    VITDTOTAL 27.9 03/19/2017     No results found for: [facility]  Lab Results   Component Value Date    PT 18.7 (H) 01/29/2019    PT 17.6 (H) 11/18/2018    PT 14.2 (H) 08/13/2018    PT 12.9 (H) 08/19/2017    PT 11.1 03/29/2017     Lab Results   Component Value Date    DESGCARBPT 0.2 03/20/2017     No results found for: PIVKAII    Bone Health: Normal vitamin D level. Last DEXA normal.     CF Related Diabetes: yes, on insulin regimen  No results found for: GLUF  No results found for: GLUCOSE2HR  Lab Results   Component Value Date    A1C 8.5 (H) 08/12/2018    A1C 10.7 (H) 04/06/2018    A1C 9.0 (H) 01/07/2018     Okey Dupre, RD, LDN  Pager: [phone]

## 2019-04-01 NOTE — Unmapped (Signed)
PULMONARY PROGRESS NOTE      Patient: Christian Young([birth year])  Reason for admission: Cystic fibrosis with pulmonary exacerbation (CMS-HCC)     Assessment and Recommendations:      Principal Problem:    Cystic fibrosis with pulmonary exacerbation (CMS-HCC)  Active Problems:    Essential hypertension    Depressive disorder    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)    Pancreatic insufficiency due to cystic fibrosis (CMS-HCC)    History of Mycobacterium abscessus infection    Bronchiectasis (CMS-HCC)    Chronic deep vein thrombosis (DVT) of lower extremity (CMS-HCC)    Restless leg syndrome  Resolved Problems:    * No resolved hospital problems. *    Christian Young is a 33 y.o. male with PMHx as noted below who presents to [facility] with Cystic fibrosis with pulmonary exacerbation (CMS-HCC).  ??  1. CF exacerbation w/ pulmonary manifestations w/ acute exacerbation of bronchiectasis:??Presented with increased cough and sputum production, SOB, as well as low grade fevers. ??COVID pcr is negative. ??His last sputum culture 11/20/2018 showed:??1+ Mucoid Pseudomonas??sensitive to Zosyn, Ceftaz, Aztreonam and??1+ Smooth Pseudomonas????intermediate sensitivity to Tobra and resistant to all else.   - Sputum Culture with pseudomonas  -??Continue??pip/tazo and inhaled tobramycin per primary pulmonologist  -??Continue??home??Symdeko   - Pulmozyme 2.5 mg BID  - montelukast 10 mg qHS  - cetirizine 10 mg daily  - Symbicort 2 puff BID??or formulary equivalent??  - HTS 7% QID w/ RT  - Aerobika and chest vest QID w/ RT    2. History of Mycobacterium abscessus infection:??Required RULobectomy in August 2018.??Previously??on clofazimine and azithromycin. Plan to repeat AFB smears every 3 months (starting February 2020).??  - AFB sputum culture negative  ??  3. CF-related PI  - Creon????  - MVW 2 capsules daily with additional vit D  ??  ??4. CF-related DM  Last A1c??8.5% (December)  - glargine 70 units qHS  - lispro??55??units TID AC (down from Carb counting dose of 60, increased 8/15)  - SSI  ??  ??5. Depression, anxiety  -??Fluoxetine 40 mg daily  -??Lamotrigine 100 mg daily  ????  6. HTN  -??Lisinopril 10 mg daily  ??  7. History of DVT  -??Rivaroxaban 20 mg daily  ????  8. RLS  -??Pramipexole 0.25 mg daily  -??Gabapentin QHS to replace Horizant??  ??  FEN/GI/PPX  Reg diet  PO fluids  Xarelto   Please page [phone] with any questions.      Karie Soda, MD    Subjective:      Interval History (04/01/19)  Continues to feel better. Soreness at port site is still present but improving.        Objective:      Physical Exam:  Vitals:    03/31/19 0914 03/31/19 1942 03/31/19 2003 04/01/19 0754   BP: 121/78 134/88  118/71   Pulse: 100 111 119 92   Resp: 18 16 16 17    Temp: 36 ??C (96.8 ??F) 36.7 ??C (98.1 ??F)  36.7 ??C (98.1 ??F)   TempSrc: Oral   Oral   SpO2: 100% 96% 98% 100%   Weight:       Height:         General: Alert, well-appearing, and in no distress. RIght chest dressed at removed port site.  Left chest with new accessed port, no erythema  Eyes: Anicteric sclera, conjunctiva clear.  ENT:  Mucous membranes moist and intact.  Lungs: Normal excursion, no dullness to percussion. Good air  movement bilaterally, without wheezes or crackles. Normal upper airway sounds without evidence of stridor.  Cardiovascular: Regular rate and rhythm, S1, S2 normal, no murmur, click, rub or gallop appreciated.  Abdomen: Soft, non-tender, not distended, bowel sounds are normal, liver is not enlarged, spleen is not enlarged  Musculoskeletal: No synovitis.  Skin: No rashes or lesions.  Neuro: No focal neurological deficits.    Malnutrition Assessment using AND/ASPEN Clinical Characteristics:           Patient Lines/Drains/Airways Status    Active Active Lines, Drains, & Airways     Name:   Placement date:   Placement time:   Site:   Days:    Port A Cath 05/30/17 Right Chest   05/30/17    1746    Chest   670    Power Port--a-Cath Single Hub 03/27/19 Left Internal jugular   03/27/19 0907    Internal jugular   5              Patient Lines/Drains/Airways Status    Active Wounds     Name:   Placement date:   Placement time:   Site:   Days:    Surgical Site 03/29/17 Chest Right   03/29/17    1346     732    Surgical Site Axilla Right   ???    ???                        Diagnostic Review:   All labs and images were personally reviewed.

## 2019-04-01 NOTE — Unmapped (Signed)
Pt's blood sugar at this time was 63, and pt given juice and graham crackers. Blood sugar recheck was 102. Pt remained stable and no symptoms.

## 2019-04-02 ENCOUNTER — Ambulatory Visit (INDEPENDENT_AMBULATORY_CARE_PROVIDER_SITE_OTHER): Payer: 59 | Admitting: Psychology

## 2019-04-02 DIAGNOSIS — F330 Major depressive disorder, recurrent, mild: Secondary | ICD-10-CM

## 2019-04-02 DIAGNOSIS — F411 Generalized anxiety disorder: Secondary | ICD-10-CM | POA: Diagnosis not present

## 2019-04-02 LAB — CBC W/ AUTO DIFF
BASOPHILS RELATIVE PERCENT: 0.8 %
EOSINOPHILS ABSOLUTE COUNT: 0.6 10*9/L — ABNORMAL HIGH (ref 0.0–0.4)
EOSINOPHILS RELATIVE PERCENT: 8 %
HEMOGLOBIN: 12.7 g/dL — ABNORMAL LOW (ref 13.5–17.5)
LARGE UNSTAINED CELLS: 2 % (ref 0–4)
LYMPHOCYTES ABSOLUTE COUNT: 1.5 10*9/L (ref 1.5–5.0)
LYMPHOCYTES RELATIVE PERCENT: 21.9 %
MEAN CORPUSCULAR HEMOGLOBIN CONC: 32.8 g/dL (ref 31.0–37.0)
MEAN CORPUSCULAR HEMOGLOBIN: 27.8 pg (ref 26.0–34.0)
MEAN CORPUSCULAR VOLUME: 84.7 fL (ref 80.0–100.0)
MEAN PLATELET VOLUME: 7.4 fL (ref 7.0–10.0)
MONOCYTES ABSOLUTE COUNT: 0.6 10*9/L (ref 0.2–0.8)
MONOCYTES RELATIVE PERCENT: 8.1 %
NEUTROPHILS ABSOLUTE COUNT: 4.1 10*9/L (ref 2.0–7.5)
NEUTROPHILS RELATIVE PERCENT: 58.9 %
PLATELET COUNT: 320 10*9/L (ref 150–440)
RED BLOOD CELL COUNT: 4.57 10*12/L (ref 4.50–5.90)
RED CELL DISTRIBUTION WIDTH: 15 % (ref 12.0–15.0)
WBC ADJUSTED: 7 10*9/L (ref 4.5–11.0)

## 2019-04-02 LAB — COMPREHENSIVE METABOLIC PANEL
ALBUMIN: 3.9 g/dL (ref 3.5–5.0)
ALKALINE PHOSPHATASE: 64 U/L (ref 38–126)
ALT (SGPT): 64 U/L — ABNORMAL HIGH (ref <50)
ANION GAP: 7 mmol/L (ref 7–15)
AST (SGOT): 46 U/L (ref 19–55)
BLOOD UREA NITROGEN: 13 mg/dL (ref 7–21)
BUN / CREAT RATIO: 16
CALCIUM: 8.9 mg/dL (ref 8.5–10.2)
CHLORIDE: 104 mmol/L (ref 98–107)
CO2: 27 mmol/L (ref 22.0–30.0)
CREATININE: 0.8 mg/dL (ref 0.70–1.30)
EGFR CKD-EPI AA MALE: >90 mL/min/{1.73_m2} (ref >=60)
EGFR CKD-EPI NON-AA MALE: >90 mL/min/{1.73_m2} (ref >=60)
GLUCOSE RANDOM: 94 mg/dL (ref 70–179)
POTASSIUM: 4.1 mmol/L (ref 3.5–5.0)
PROTEIN TOTAL: 6.7 g/dL (ref 6.5–8.3)
SODIUM: 138 mmol/L (ref 135–145)

## 2019-04-02 LAB — RED BLOOD CELL COUNT: Lab: 4.57

## 2019-04-02 LAB — SODIUM: Sodium:SCnc:Pt:Ser/Plas:Qn:: 138

## 2019-04-02 NOTE — Unmapped (Signed)
PULMONARY PROGRESS NOTE      Patient: Christian Young(02/04/1986)  Reason for admission: Cystic fibrosis with pulmonary exacerbation (CMS-HCC)     Assessment and Recommendations:      Principal Problem:    Cystic fibrosis with pulmonary exacerbation (CMS-HCC)  Active Problems:    Essential hypertension    Depressive disorder    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)    Pancreatic insufficiency due to cystic fibrosis (CMS-HCC)    History of Mycobacterium abscessus infection    Bronchiectasis (CMS-HCC)    Chronic deep vein thrombosis (DVT) of lower extremity (CMS-HCC)    Restless leg syndrome  Resolved Problems:    * No resolved hospital problems. *    Christian Young is a 33 y.o. male with PMHx as noted below who presents to [facility] with Cystic fibrosis with pulmonary exacerbation (CMS-HCC).  ??  1. CF exacerbation w/ pulmonary manifestations w/ acute exacerbation of bronchiectasis:??Presented with increased cough and sputum production, SOB, as well as low grade fevers. ??COVID pcr is negative. ??His last sputum culture 11/20/2018 showed:??1+ Mucoid Pseudomonas??sensitive to Zosyn, Ceftaz, Aztreonam and??1+ Smooth Pseudomonas????intermediate sensitivity to Tobra and resistant to all else.   - Sputum Culture with pseudomonas  -??Continue??pip/tazo and inhaled tobramycin per primary pulmonologist  -??Continue??home??Symdeko   - Pulmozyme 2.5 mg BID  - montelukast 10 mg qHS  - cetirizine 10 mg daily  - Symbicort 2 puff BID??or formulary equivalent??  - HTS 7% QID w/ RT  - Aerobika and chest vest QID w/ RT    2. History of Mycobacterium abscessus infection:??Required RULobectomy in August 2018.??Previously??on clofazimine and azithromycin. Plan to repeat AFB smears every 3 months (starting February 2020).??  - AFB sputum culture negative  ??  3. CF-related PI  - Creon????  - MVW 2 capsules daily with additional vit D  ??  ??4. CF-related DM  Last A1c??8.5% (December)  - glargine 70 units qHS  - lispro??55??units TID AC (down from Carb counting dose of 60, increased 8/15)  - SSI  ??  ??5. Depression, anxiety  -??Fluoxetine 40 mg daily  -??Lamotrigine 100 mg daily  ????  6. HTN  -??Lisinopril 10 mg daily  ??  7. History of DVT  -??Rivaroxaban 20 mg daily  ????  8. RLS  -??Pramipexole 0.25 mg daily  -??Gabapentin QHS to replace Horizant??  ??  FEN/GI/PPX  Reg diet  PO fluids  Xarelto   Please page [phone] with any questions.      Karie Soda, MD    Subjective:      Interval History (04/02/19)  Continues to feel better. No questions or concerns today.        Objective:      Physical Exam:  Vitals:    03/31/19 2003 04/01/19 0754 04/01/19 1916 04/01/19 2058   BP:  118/71 117/69    Pulse: 119 92 106 105   Resp: 16 17 16 18    Temp:  36.7 ??C (98.1 ??F) 36.8 ??C (98.2 ??F)    TempSrc:  Oral Oral    SpO2: 98% 100% 96% 98%   Weight:       Height:         General: Alert, well-appearing, and in no distress. RIght chest dressed at removed port site.  Left chest with new accessed port, no erythema  Eyes: Anicteric sclera, conjunctiva clear.  ENT:  Mucous membranes moist and intact.  Lungs: Normal excursion, no dullness to percussion. Good air movement bilaterally, without wheezes or crackles. Normal  upper airway sounds without evidence of stridor.  Cardiovascular: Regular rate and rhythm, S1, S2 normal, no murmur, click, rub or gallop appreciated.  Abdomen: Soft, non-tender, not distended, bowel sounds are normal, liver is not enlarged, spleen is not enlarged  Musculoskeletal: No synovitis.  Skin: No rashes or lesions.  Neuro: No focal neurological deficits.    Malnutrition Assessment using AND/ASPEN Clinical Characteristics:           Patient Lines/Drains/Airways Status    Active Active Lines, Drains, & Airways     Name:   Placement date:   Placement time:   Site:   Days:    Port A Cath 05/30/17 Right Chest   05/30/17    1746    Chest   671    Power Port--a-Cath Single Hub 03/27/19 Left Internal jugular   03/27/19    0907    Internal jugular   6 Patient Lines/Drains/Airways Status    Active Wounds     Name:   Placement date:   Placement time:   Site:   Days:    Surgical Site 03/29/17 Chest Right   03/29/17    1346     734    Surgical Site Axilla Right   ???    ???                        Diagnostic Review:   All labs and images were personally reviewed.

## 2019-04-02 NOTE — Unmapped (Signed)
Patient states he will perform airway clearance post tx. Patient has home chest vest.

## 2019-04-02 NOTE — Unmapped (Signed)
Pt continues on contact isolation precautions. IV antibiotics given via port, continues nebs with RT. Pt had PFT's done today. BG monitored AC&HS, pt with BG in 60's again this evening, spoke with Hospitalist cross-covering, Dr. Bess Harvest, aware and decreasing meal time Humalog to 50 units-recheck BG after dinner and if BG increased then ok to give the mealtime insulin. Pt asymptomatic other than reported slight tremors to hands when extended. Pt sitting in chair eating dinner. Call bell within reach. Continue to monitor.   Problem: Adult Inpatient Plan of Care  Goal: Plan of Care Review  Outcome: Progressing  Goal: Patient-Specific Goal (Individualization)  04/01/2019 1807 by Lawerance Bach, RN  Flowsheets (Taken 04/01/2019 1807)  Patient-Specific Goals (Include Timeframe): no more episodes of hypoglycemia this admission  04/01/2019 1806 by Lawerance Bach, RN  Outcome: Progressing  Flowsheets (Taken 04/01/2019 1806)  Patient-Specific Goals (Include Timeframe): avoid hypoglycemia  Individualized Care Needs: IV antibioitcs, isolation precautions, neb treatments, monitor BG  Anxieties, Fears or Concerns: low BG x2 evenings  Goal: Absence of Hospital-Acquired Illness or Injury  Outcome: Progressing  Goal: Optimal Comfort and Wellbeing  Outcome: Progressing  Goal: Readiness for Transition of Care  Outcome: Progressing  Goal: Rounds/Family Conference  Outcome: Progressing     Problem: Diabetes Comorbidity  Goal: Blood Glucose Level Within Desired Range  Outcome: Progressing     Problem: Hypertension Comorbidity  Goal: Blood Pressure in Desired Range  Outcome: Progressing     Problem: Wound  Goal: Optimal Wound Healing  Outcome: Progressing     Problem: Infection  Goal: Infection Symptom Resolution  Outcome: Progressing

## 2019-04-02 NOTE — Unmapped (Signed)
Patient alert and oriented x 4, vital signs WNL. Patient remains free of falls and injuries this shift, he is able to ambulate independently. Contact precautions maintained. Pain controlled with prn tylenol and oxycodone - 1 dose of each. IV antibiotics administered as ordered. Pt is currently resting quietly in bed. Will continue to monitor.   Problem: Adult Inpatient Plan of Care  Goal: Plan of Care Review  Outcome: Progressing  Flowsheets (Taken 04/01/2019 2329)  Plan of Care Reviewed With: patient  Goal: Patient-Specific Goal (Individualization)  Outcome: Progressing  Goal: Absence of Hospital-Acquired Illness or Injury  Outcome: Progressing  Intervention: Identify and Manage Fall Risk  Flowsheets (Taken 04/01/2019 2329)  Safety Interventions:  ??? nonskid shoes/slippers when out of bed  ??? isolation precautions  Intervention: Prevent Infection  Flowsheets (Taken 04/01/2019 2329)  Infection Prevention:  ??? handwashing promoted  ??? rest/sleep promoted  Goal: Optimal Comfort and Wellbeing  Outcome: Progressing  Intervention: Monitor Pain and Promote Comfort  Flowsheets (Taken 04/01/2019 2329)  Pain Management Interventions: care clustered  Intervention: Provide Person-Centered Care  Flowsheets (Taken 04/01/2019 2329)  Trust Relationship/Rapport:  ??? care explained  ??? questions answered  Goal: Readiness for Transition of Care  Outcome: Progressing  Goal: Rounds/Family Conference  Outcome: Progressing     Problem: Diabetes Comorbidity  Goal: Blood Glucose Level Within Desired Range  Outcome: Progressing  Intervention: Maintain Glycemic Control  Flowsheets (Taken 04/01/2019 2329)  Glycemic Management: blood glucose monitoring     Problem: Hypertension Comorbidity  Goal: Blood Pressure in Desired Range  Outcome: Progressing     Problem: Wound  Goal: Optimal Wound Healing  Outcome: Progressing     Problem: Infection  Goal: Infection Symptom Resolution  Outcome: Progressing  Intervention: Prevent or Manage Infection  Flowsheets (Taken 04/01/2019 2329)  Isolation Precautions: contact precautions maintained

## 2019-04-02 NOTE — Unmapped (Signed)
Pt continues on isolation precautions. IV antibiotics administered via port, dressing c/d/I/. BG monitored AC&HS, insulin given per orders, no episodes of hypoglycemia thus far this shift. Call bell within reach. Continue to monitor.   Problem: Adult Inpatient Plan of Care  Goal: Plan of Care Review  Outcome: Progressing  Goal: Patient-Specific Goal (Individualization)  Outcome: Progressing  Flowsheets (Taken 04/02/2019 1531)  Individualized Care Needs: IV antibiotics, neb treatments, isolation precautions, monitor BG  Goal: Absence of Hospital-Acquired Illness or Injury  Outcome: Progressing  Goal: Optimal Comfort and Wellbeing  Outcome: Progressing  Goal: Readiness for Transition of Care  Outcome: Progressing  Goal: Rounds/Family Conference  Outcome: Progressing     Problem: Diabetes Comorbidity  Goal: Blood Glucose Level Within Desired Range  Outcome: Progressing     Problem: Hypertension Comorbidity  Goal: Blood Pressure in Desired Range  Outcome: Progressing     Problem: Wound  Goal: Optimal Wound Healing  Outcome: Progressing     Problem: Infection  Goal: Infection Symptom Resolution  Outcome: Progressing

## 2019-04-03 NOTE — Unmapped (Signed)
Afebrile.  VSS.  Pain at port a cath site managed by PRN Oxycodone and Tylenol.  No other pain or n/v/d.  Insulin and IV antibiotics administered as ordered. Call bell and personal items are within reach. Will continue to monitor.    Problem: Adult Inpatient Plan of Care  Goal: Plan of Care Review  Outcome: Progressing  Goal: Patient-Specific Goal (Individualization)  Outcome: Progressing  Goal: Absence of Hospital-Acquired Illness or Injury  Outcome: Progressing  Goal: Optimal Comfort and Wellbeing  Outcome: Progressing  Goal: Readiness for Transition of Care  Outcome: Progressing  Goal: Rounds/Family Conference  Outcome: Progressing

## 2019-04-03 NOTE — Unmapped (Signed)
PULMONARY PROGRESS NOTE      Patient: Christian Young(18-Sep-1985)  Reason for admission: Cystic fibrosis with pulmonary exacerbation (CMS-HCC)     Assessment and Recommendations:      Principal Problem:    Cystic fibrosis with pulmonary exacerbation (CMS-HCC)  Active Problems:    Essential hypertension    Depressive disorder    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)    Pancreatic insufficiency due to cystic fibrosis (CMS-HCC)    History of Mycobacterium abscessus infection    Bronchiectasis (CMS-HCC)    Chronic deep vein thrombosis (DVT) of lower extremity (CMS-HCC)    Restless leg syndrome  Resolved Problems:    * No resolved hospital problems. *    Adil Tugwell is a 33 y.o. male with PMHx as noted below who presents to [facility] with Cystic fibrosis with pulmonary exacerbation (CMS-HCC).  ??  1. CF exacerbation w/ pulmonary manifestations w/ acute exacerbation of bronchiectasis:??Presented with increased cough and sputum production, SOB, as well as low grade fevers. ??COVID pcr is negative. ??His last sputum culture 11/20/2018 showed:??1+ Mucoid Pseudomonas??sensitive to Zosyn, Ceftaz, Aztreonam and??1+ Smooth Pseudomonas????intermediate sensitivity to Tobra and resistant to all else.   - Sputum Culture with pseudomonas  -??Continue??pip/tazo and inhaled tobramycin per primary pulmonologist  -??Continue??home??Symdeko   - Pulmozyme 2.5 mg BID  - montelukast 10 mg qHS  - cetirizine 10 mg daily  - Symbicort 2 puff BID??or formulary equivalent??  - HTS 7% QID w/ RT  - Aerobika and chest vest QID w/ RT    2. History of Mycobacterium abscessus infection:??Required RULobectomy in August 2018.??Previously??on clofazimine and azithromycin. Plan to repeat AFB smears every 3 months (starting February 2020).??  - AFB sputum culture negative  ??  3. CF-related PI  - Creon????  - MVW 2 capsules daily with additional vit D  ??  ??4. CF-related DM  Last A1c??8.5% (December)  - glargine 70 units qHS  - lispro??55??units TID AC (down from Carb counting dose of 60, increased 8/15)  - SSI  ??  ??5. Depression, anxiety  -??Fluoxetine 40 mg daily  -??Lamotrigine 100 mg daily  ????  6. HTN  -??Lisinopril 10 mg daily  ??  7. History of DVT  -??Rivaroxaban 20 mg daily  ????  8. RLS  -??Pramipexole 0.25 mg daily  -??Gabapentin QHS to replace Horizant??  ??  FEN/GI/PPX  Reg diet  PO fluids  Xarelto   Please page [phone] with any questions.      Karie Soda, MD    Subjective:      Interval History (04/03/19)  Feeling well. No questions or concerns today.        Objective:      Physical Exam:  Vitals:    04/02/19 1942 04/02/19 2015 04/03/19 0417 04/03/19 0855   BP:  123/81 102/60    Pulse: 114 108 86 100   Resp: 18 18 18 16    Temp:  36.9 ??C (98.4 ??F) 36.9 ??C (98.4 ??F)    TempSrc:  Oral Oral    SpO2: 100% 94% 95% 96%   Weight:       Height:         General: Alert, well-appearing, and in no distress. RIght chest dressed at removed port site.  Left chest with new accessed port, no erythema  Eyes: Anicteric sclera, conjunctiva clear.  ENT:  Mucous membranes moist and intact.  Lungs: Normal excursion, no dullness to percussion. Good air movement bilaterally, without wheezes or crackles. Normal upper airway  sounds without evidence of stridor.  Cardiovascular: Regular rate and rhythm, S1, S2 normal, no murmur, click, rub or gallop appreciated.  Abdomen: Soft, non-tender, not distended, bowel sounds are normal, liver is not enlarged, spleen is not enlarged  Musculoskeletal: No synovitis.  Skin: No rashes or lesions.  Neuro: No focal neurological deficits.    Malnutrition Assessment using AND/ASPEN Clinical Characteristics:           Patient Lines/Drains/Airways Status    Active Active Lines, Drains, & Airways     Name:   Placement date:   Placement time:   Site:   Days:    Port A Cath 05/30/17 Right Chest   05/30/17    1746    Chest   672    Power Port--a-Cath Single Hub 03/27/19 Left Internal jugular   03/27/19    0907    Internal jugular   7              Patient Lines/Drains/Airways Status    Active Wounds     Name:   Placement date:   Placement time:   Site:   Days:    Surgical Site 03/29/17 Chest Right   03/29/17    1346     735    Surgical Site Axilla Right   ???    ???                        Diagnostic Review:   All labs and images were personally reviewed.

## 2019-04-04 LAB — BASIC METABOLIC PANEL
ANION GAP: 7 mmol/L (ref 7–15)
BLOOD UREA NITROGEN: 14 mg/dL (ref 7–21)
BUN / CREAT RATIO: 15
CHLORIDE: 103 mmol/L (ref 98–107)
CO2: 29 mmol/L (ref 22.0–30.0)
CREATININE: 0.93 mg/dL (ref 0.70–1.30)
EGFR CKD-EPI AA MALE: >90 mL/min/{1.73_m2} (ref >=60)
EGFR CKD-EPI NON-AA MALE: >90 mL/min/{1.73_m2} (ref >=60)
GLUCOSE RANDOM: 112 mg/dL (ref 70–179)
POTASSIUM: 5 mmol/L (ref 3.5–5.0)
SODIUM: 139 mmol/L (ref 135–145)

## 2019-04-04 LAB — POTASSIUM: Potassium:SCnc:Pt:Ser/Plas:Qn:: 5

## 2019-04-04 NOTE — Unmapped (Signed)
POC discussed with patient.  NO falls noted during my shift thus far and I will continue to monitor patient.  Per patient request PIV started.  IV antibiotics given per orders.     Problem: Adult Inpatient Plan of Care  Goal: Plan of Care Review  Outcome: Progressing  Goal: Patient-Specific Goal (Individualization)  Outcome: Progressing  Goal: Absence of Hospital-Acquired Illness or Injury  Outcome: Progressing  Goal: Optimal Comfort and Wellbeing  Outcome: Progressing  Goal: Readiness for Transition of Care  Outcome: Progressing  Goal: Rounds/Family Conference  Outcome: Progressing     Problem: Diabetes Comorbidity  Goal: Blood Glucose Level Within Desired Range  Outcome: Progressing     Problem: Hypertension Comorbidity  Goal: Blood Pressure in Desired Range  Outcome: Progressing     Problem: Wound  Goal: Optimal Wound Healing  Outcome: Progressing     Problem: Infection  Goal: Infection Symptom Resolution  Outcome: Progressing

## 2019-04-04 NOTE — Unmapped (Signed)
Pt continues on isolation precautions. Here for IV antibiotics. RT administering nebs. Pt's BG down to 53 at lunch time check, pt symptomatic, able to bring it up with po intake, no further complaints or concerns. Call bell within reach. Continue to monitor.   Problem: Adult Inpatient Plan of Care  Goal: Plan of Care Review  Outcome: Progressing  Goal: Patient-Specific Goal (Individualization)  Outcome: Progressing  Goal: Absence of Hospital-Acquired Illness or Injury  Outcome: Progressing  Goal: Optimal Comfort and Wellbeing  Outcome: Progressing  Goal: Readiness for Transition of Care  Outcome: Progressing  Goal: Rounds/Family Conference  Outcome: Progressing     Problem: Diabetes Comorbidity  Goal: Blood Glucose Level Within Desired Range  Outcome: Progressing     Problem: Hypertension Comorbidity  Goal: Blood Pressure in Desired Range  Outcome: Progressing     Problem: Wound  Goal: Optimal Wound Healing  Outcome: Progressing     Problem: Infection  Goal: Infection Symptom Resolution  Outcome: Progressing

## 2019-04-04 NOTE — Unmapped (Signed)
Patient compliant with inhaled medications and airway clearance (self use home vest) as ordered.  Room Air, SPO2 >97%.

## 2019-04-04 NOTE — Unmapped (Signed)
PULMONARY PROGRESS NOTE      Patient: Christian Young([birth year])  Reason for admission: Cystic fibrosis with pulmonary exacerbation (CMS-HCC)     Assessment and Recommendations:      Principal Problem:    Cystic fibrosis with pulmonary exacerbation (CMS-HCC)  Active Problems:    Essential hypertension    Depressive disorder    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)    Pancreatic insufficiency due to cystic fibrosis (CMS-HCC)    History of Mycobacterium abscessus infection    Bronchiectasis (CMS-HCC)    Chronic deep vein thrombosis (DVT) of lower extremity (CMS-HCC)    Restless leg syndrome  Resolved Problems:    * No resolved hospital problems. *    Christian Young is a 33 y.o. male with PMHx as noted below who presents to [facility] with Cystic fibrosis with pulmonary exacerbation (CMS-HCC).  ??  1. CF exacerbation w/ pulmonary manifestations w/ acute exacerbation of bronchiectasis:??Presented with increased cough and sputum production, SOB, as well as low grade fevers. ??COVID pcr is negative. ??His last sputum culture 11/20/2018 showed:??1+ Mucoid Pseudomonas??sensitive to Zosyn, Ceftaz, Aztreonam and??1+ Smooth Pseudomonas????intermediate sensitivity to Tobra and resistant to all else.   - Sputum Culture with pseudomonas  -??Continue??pip/tazo and inhaled tobramycin per primary pulmonologist  -??Continue??home??Symdeko   - Pulmozyme 2.5 mg BID  - montelukast 10 mg qHS  - cetirizine 10 mg daily  - Symbicort 2 puff BID??or formulary equivalent??  - HTS 7% QID w/ RT  - Aerobika and chest vest QID w/ RT    2. History of Mycobacterium abscessus infection:??Required RULobectomy in August 2018.??Previously??on clofazimine and azithromycin. Plan to repeat AFB smears every 3 months (starting February 2020).??  - AFB sputum culture negative  ??  3. CF-related PI  - Creon????  - MVW 2 capsules daily with additional vit D  ??  ??4. CF-related DM  Last A1c??8.5% (December)  - glargine 70 units qHS  - lispro??55??units TID AC (down from Carb counting dose of 60, increased 8/15)  - SSI  ??  ??5. Depression, anxiety  -??Fluoxetine 40 mg daily  -??Lamotrigine 100 mg daily  ????  6. HTN  -??Lisinopril 10 mg daily  ??  7. History of DVT  -??Rivaroxaban 20 mg daily  ????  8. RLS  -??Pramipexole 0.25 mg daily  -??Gabapentin QHS to replace Horizant??  ??  FEN/GI/PPX  Reg diet  PO fluids  Xarelto   Please page [phone] with any questions.      Karie Soda, MD    Subjective:      Interval History (04/04/19)  Feeling well. Port was due for needle change yesterday but upon attempt to re-access could not draw blood and then patient was experiencing pain with attempted access to peripheral IV was placed instead. Otherwise no issues, questions or concerns.        Objective:      Physical Exam:  Vitals:    04/03/19 0855 04/03/19 1940 04/03/19 2045 04/04/19 0500   BP:  125/86  112/83   Pulse: 100 106 105 99   Resp: 16 18 16 16    Temp:  36.9 ??C (98.4 ??F)  36.4 ??C (97.5 ??F)   TempSrc:  Oral  Oral   SpO2: 96% 98% 99% 99%   Weight:       Height:         General: Alert, well-appearing, and in no distress. RIght chest dressed at removed port site.  Left chest with new port, no erythema or fluctuance,  appropriate post-procedural tenderness to palpation   Eyes: Anicteric sclera, conjunctiva clear.  ENT:  Mucous membranes moist and intact.  Lungs: Normal excursion, no dullness to percussion. Good air movement bilaterally, without wheezes or crackles. Normal upper airway sounds without evidence of stridor.  Cardiovascular: Regular rate and rhythm, S1, S2 normal, no murmur, click, rub or gallop appreciated.  Abdomen: Soft, non-tender, not distended, bowel sounds are normal, liver is not enlarged, spleen is not enlarged  Musculoskeletal: No synovitis.  Skin: No rashes or lesions.  Neuro: No focal neurological deficits.    Malnutrition Assessment using AND/ASPEN Clinical Characteristics:           Patient Lines/Drains/Airways Status    Active Active Lines, Drains, & Airways Name:   Placement date:   Placement time:   Site:   Days:    Port A Cath 05/30/17 Right Chest   05/30/17    1746    Chest   673    Power Port--a-Cath Single Hub 03/27/19 Left Internal jugular   03/27/19    0907    Internal jugular   8    Peripheral IV 04/03/19 Left Antecubital   04/03/19    2000    Antecubital   less than 1              Patient Lines/Drains/Airways Status    Active Wounds     Name:   Placement date:   Placement time:   Site:   Days:    Surgical Site 03/29/17 Chest Right   03/29/17    1346     735    Surgical Site Axilla Right   ???    ???                        Diagnostic Review:   All labs and images were personally reviewed.

## 2019-04-05 NOTE — Unmapped (Signed)
POC discussed with patient.  NO falls noted during my shift thus far and I will continue to monitor patient.  Pain to left chest port site controlled with prn medications and a dose of morphine.  IV antibiotics given per orders.     Problem: Adult Inpatient Plan of Care  Goal: Plan of Care Review  Outcome: Progressing  Goal: Patient-Specific Goal (Individualization)  Outcome: Progressing  Goal: Absence of Hospital-Acquired Illness or Injury  Outcome: Progressing  Goal: Optimal Comfort and Wellbeing  Outcome: Progressing  Goal: Readiness for Transition of Care  Outcome: Progressing  Goal: Rounds/Family Conference  Outcome: Progressing     Problem: Diabetes Comorbidity  Goal: Blood Glucose Level Within Desired Range  Outcome: Progressing     Problem: Hypertension Comorbidity  Goal: Blood Pressure in Desired Range  Outcome: Progressing     Problem: Wound  Goal: Optimal Wound Healing  Outcome: Progressing     Problem: Infection  Goal: Infection Symptom Resolution  Outcome: Progressing

## 2019-04-05 NOTE — Unmapped (Signed)
Pt alert and oriented x4. Reports no pain at this time. IVABX given via PIV. Pts port not accessed at this time as pt has some discomfort to site.VSS and pt in NAD.       Problem: Adult Inpatient Plan of Care  Goal: Plan of Care Review  Outcome: Progressing  Goal: Patient-Specific Goal (Individualization)  Outcome: Progressing  Goal: Absence of Hospital-Acquired Illness or Injury  Outcome: Progressing  Goal: Optimal Comfort and Wellbeing  Outcome: Progressing  Goal: Readiness for Transition of Care  Outcome: Progressing  Goal: Rounds/Family Conference  Outcome: Progressing     Problem: Diabetes Comorbidity  Goal: Blood Glucose Level Within Desired Range  Outcome: Progressing     Problem: Hypertension Comorbidity  Goal: Blood Pressure in Desired Range  Outcome: Progressing     Problem: Wound  Goal: Optimal Wound Healing  Outcome: Progressing     Problem: Infection  Goal: Infection Symptom Resolution  Outcome: Progressing

## 2019-04-05 NOTE — Unmapped (Signed)
PULMONARY PROGRESS NOTE      Patient: Christian Young([birth year])  Reason for admission: Cystic fibrosis with pulmonary exacerbation (CMS-HCC)     Assessment and Recommendations:      Principal Problem:    Cystic fibrosis with pulmonary exacerbation (CMS-HCC)  Active Problems:    Essential hypertension    Depressive disorder    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)    Pancreatic insufficiency due to cystic fibrosis (CMS-HCC)    History of Mycobacterium abscessus infection    Bronchiectasis (CMS-HCC)    Chronic deep vein thrombosis (DVT) of lower extremity (CMS-HCC)    Restless leg syndrome  Resolved Problems:    * No resolved hospital problems. *    Christian Young is a 33 y.o. male with PMHx as noted below who presents to [facility] with Cystic fibrosis with pulmonary exacerbation (CMS-HCC).  ??  1. CF exacerbation w/ pulmonary manifestations w/ acute exacerbation of bronchiectasis:??Presented with increased cough and sputum production, SOB, as well as low grade fevers. ??COVID pcr is negative. ??His last sputum culture 11/20/2018 showed:??1+ Mucoid Pseudomonas??sensitive to Zosyn, Ceftaz, Aztreonam and??1+ Smooth Pseudomonas????intermediate sensitivity to Tobra and resistant to all else.   - Sputum Culture with pseudomonas  -??Continue??pip/tazo and inhaled tobramycin per primary pulmonologist  -??Continue??home??Symdeko   - Pulmozyme 2.5 mg BID  - montelukast 10 mg qHS  - cetirizine 10 mg daily  - Symbicort 2 puff BID??or formulary equivalent??  - HTS 7% QID w/ RT  - Aerobika and chest vest QID w/ RT    2. History of Mycobacterium abscessus infection:??Required RULobectomy in August 2018.??Previously??on clofazimine and azithromycin. Plan to repeat AFB smears every 3 months (starting February 2020).??  - AFB sputum culture negative  ??  3. CF-related PI  - Creon????  - MVW 2 capsules daily with additional vit D  ??  ??4. CF-related DM  Last A1c??8.5% (December)  - glargine 70 units qHS  - lispro??55??units TID AC (down from Carb counting dose of 60, increased 8/15)  - SSI  ??  ??5. Depression, anxiety  -??Fluoxetine 40 mg daily  -??Lamotrigine 100 mg daily  ????  6. HTN  -??Lisinopril 10 mg daily  ??  7. History of DVT  -??Rivaroxaban 20 mg daily  ????  8. RLS  -??Pramipexole 0.25 mg daily  -??Gabapentin QHS to replace Horizant??  ??  FEN/GI/PPX  Reg diet  PO fluids  Xarelto   Please page [phone] with any questions.      Karie Soda, MD    Subjective:      Interval History (04/05/19)  Feeling well. Still using peripheral IV due to tenderness with accessing port. No questions or concerns, looking forward to discharge next week.         Objective:      Physical Exam:  Vitals:    04/04/19 0500 04/04/19 1235 04/04/19 2023 04/05/19 0400   BP: 112/83 145/82 130/65 128/72   Pulse: 99 110 103 98   Resp: 16 16 18 18    Temp: 36.4 ??C (97.5 ??F) 36.1 ??C (97 ??F) 36.8 ??C (98.2 ??F) 36.7 ??C (98.1 ??F)   TempSrc: Oral Oral  Oral   SpO2: 99% 98% 96% 97%   Weight:       Height:         General: Alert, well-appearing, and in no distress. RIght chest dressed at removed port site.  Left chest with new port, no erythema or fluctuance, appropriate post-procedural tenderness to palpation   Eyes: Anicteric sclera,  conjunctiva clear.  ENT:  Mucous membranes moist and intact.  Lungs: Normal excursion, no dullness to percussion. Good air movement bilaterally, without wheezes or crackles. Normal upper airway sounds without evidence of stridor.  Cardiovascular: Regular rate and rhythm, S1, S2 normal, no murmur, click, rub or gallop appreciated.  Abdomen: Soft, non-tender, not distended, bowel sounds are normal, liver is not enlarged, spleen is not enlarged  Musculoskeletal: No synovitis.  Skin: No rashes or lesions.  Neuro: No focal neurological deficits.    Malnutrition Assessment using AND/ASPEN Clinical Characteristics:           Patient Lines/Drains/Airways Status    Active Active Lines, Drains, & Airways     Name:   Placement date:   Placement time:   Site:   Days: Port A Cath 05/30/17 Right Chest   05/30/17    1746    Chest   674    Power Port--a-Cath Single Hub 03/27/19 Left Internal jugular   03/27/19    7062    Internal jugular   9    Peripheral IV 04/03/19 Left Antecubital   04/03/19    2000    Antecubital   1              Patient Lines/Drains/Airways Status    Active Wounds     Name:   Placement date:   Placement time:   Site:   Days:    Surgical Site 03/29/17 Chest Right   03/29/17    1346     736    Surgical Site Axilla Right   ???    ???                        Diagnostic Review:   All labs and images were personally reviewed.

## 2019-04-06 LAB — CBC W/ AUTO DIFF
BASOPHILS ABSOLUTE COUNT: 0.1 10*9/L (ref 0.0–0.1)
EOSINOPHILS ABSOLUTE COUNT: 0.6 10*9/L — ABNORMAL HIGH (ref 0.0–0.4)
EOSINOPHILS RELATIVE PERCENT: 9.3 %
HEMATOCRIT: 39.2 % — ABNORMAL LOW (ref 41.0–53.0)
HEMOGLOBIN: 12.6 g/dL — ABNORMAL LOW (ref 13.5–17.5)
LYMPHOCYTES ABSOLUTE COUNT: 1.6 10*9/L (ref 1.5–5.0)
LYMPHOCYTES RELATIVE PERCENT: 25.7 %
MEAN CORPUSCULAR HEMOGLOBIN CONC: 32.3 g/dL (ref 31.0–37.0)
MEAN CORPUSCULAR HEMOGLOBIN: 27.5 pg (ref 26.0–34.0)
MEAN CORPUSCULAR VOLUME: 85.1 fL (ref 80.0–100.0)
MEAN PLATELET VOLUME: 7 fL (ref 7.0–10.0)
MONOCYTES ABSOLUTE COUNT: 0.6 10*9/L (ref 0.2–0.8)
MONOCYTES RELATIVE PERCENT: 9.7 %
NEUTROPHILS ABSOLUTE COUNT: 3.2 10*9/L (ref 2.0–7.5)
NEUTROPHILS RELATIVE PERCENT: 50.6 %
PLATELET COUNT: 303 10*9/L (ref 150–440)
RED BLOOD CELL COUNT: 4.6 10*12/L (ref 4.50–5.90)
RED CELL DISTRIBUTION WIDTH: 14.6 % (ref 12.0–15.0)
WBC ADJUSTED: 6.3 10*9/L (ref 4.5–11.0)

## 2019-04-06 LAB — COMPREHENSIVE METABOLIC PANEL
ALBUMIN: 3.9 g/dL (ref 3.5–5.0)
ALKALINE PHOSPHATASE: 63 U/L (ref 38–126)
ALT (SGPT): 59 U/L — ABNORMAL HIGH (ref <50)
ANION GAP: 6 mmol/L — ABNORMAL LOW (ref 7–15)
AST (SGOT): 45 U/L (ref 19–55)
BILIRUBIN TOTAL: 0.4 mg/dL (ref 0.0–1.2)
BLOOD UREA NITROGEN: 15 mg/dL (ref 7–21)
BUN / CREAT RATIO: 17
CALCIUM: 9 mg/dL (ref 8.5–10.2)
CHLORIDE: 103 mmol/L (ref 98–107)
CO2: 28 mmol/L (ref 22.0–30.0)
EGFR CKD-EPI AA MALE: >90 mL/min/{1.73_m2} (ref >=60)
EGFR CKD-EPI NON-AA MALE: >90 mL/min/{1.73_m2} (ref >=60)
GLUCOSE RANDOM: 108 mg/dL (ref 70–179)
POTASSIUM: 4.4 mmol/L (ref 3.5–5.0)
PROTEIN TOTAL: 6.7 g/dL (ref 6.5–8.3)

## 2019-04-06 LAB — CO2: Carbon dioxide:SCnc:Pt:Ser/Plas:Qn:: 28

## 2019-04-06 LAB — MEAN CORPUSCULAR HEMOGLOBIN: Lab: 27.5

## 2019-04-06 NOTE — Unmapped (Signed)
A&Ox4. Able to voice needs and commands. IV ABTs. On contact isolation. Left CW Port accessed and flush with minimal blood return. ACHS. EDD Wed  Problem: Adult Inpatient Plan of Care  Goal: Plan of Care Review  Outcome: Progressing  Goal: Patient-Specific Goal (Individualization)  Outcome: Progressing  Goal: Absence of Hospital-Acquired Illness or Injury  Outcome: Progressing  Goal: Optimal Comfort and Wellbeing  Outcome: Progressing  Goal: Readiness for Transition of Care  Outcome: Progressing  Goal: Rounds/Family Conference  Outcome: Progressing     Problem: Diabetes Comorbidity  Goal: Blood Glucose Level Within Desired Range  Outcome: Progressing     Problem: Hypertension Comorbidity  Goal: Blood Pressure in Desired Range  Outcome: Progressing     Problem: Wound  Goal: Optimal Wound Healing  Outcome: Progressing     Problem: Infection  Goal: Infection Symptom Resolution  Outcome: Progressing

## 2019-04-06 NOTE — Unmapped (Signed)
PULMONARY PROGRESS NOTE      Patient: Christian Young([birth year])  Reason for admission: Cystic fibrosis with pulmonary exacerbation (CMS-HCC)     Assessment and Recommendations:      Principal Problem:    Cystic fibrosis with pulmonary exacerbation (CMS-HCC)  Active Problems:    Essential hypertension    Depressive disorder    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)    Pancreatic insufficiency due to cystic fibrosis (CMS-HCC)    History of Mycobacterium abscessus infection    Bronchiectasis (CMS-HCC)    Chronic deep vein thrombosis (DVT) of lower extremity (CMS-HCC)    Restless leg syndrome  Resolved Problems:    * No resolved hospital problems. *    Christian Young is a 33 y.o. male with PMHx as noted below who presents to [facility] with Cystic fibrosis with pulmonary exacerbation (CMS-HCC).  ??  1. CF exacerbation w/ pulmonary manifestations w/ acute exacerbation of bronchiectasis:??Presented with increased cough and sputum production, SOB, as well as low grade fevers. ??COVID pcr is negative. ??His last sputum culture 11/20/2018 showed:??1+ Mucoid Pseudomonas??sensitive to Zosyn, Ceftaz, Aztreonam and??1+ Smooth Pseudomonas????intermediate sensitivity to Tobra and resistant to all else.   - Sputum Culture with resistant pseudomonas  -??Continue??pip/tazo and inhaled tobramycin per primary pulmonologist - PFTs seem to be improving from home despite resistance to zosyn on sputum.  Plan to continue through Tuesday evening to complete 14 days and d/c Wed.  -??Continue??home??Symdeko   - Pulmozyme 2.5 mg BID  - montelukast 10 mg qHS  - cetirizine 10 mg daily  - Symbicort 2 puff BID??or formulary equivalent??  - HTS 7% QID w/ RT  - Aerobika and chest vest QID w/ RT    2. History of Mycobacterium abscessus infection:??Required RULobectomy in August 2018.??Previously??on clofazimine and azithromycin. Plan to repeat AFB smears every 3 months (starting February 2020).??  - AFB sputum culture negative  ??  3. CF-related PI - Creon????  - MVW 2 capsules daily with additional vit D  ??  ??4. CF-related DM  Last A1c??8.5% (December)  - glargine 70 units qHS  - lispro??55??units TID AC (down from Carb counting dose of 60, increased 8/15)  - SSI  ??  ??5. Depression, anxiety  -??Fluoxetine 40 mg daily  -??Lamotrigine 100 mg daily  ????  6. HTN  -??Lisinopril 10 mg daily  ??  7. History of DVT  -??Rivaroxaban 20 mg daily  ????  8. RLS  -??Pramipexole 0.25 mg daily  -??Gabapentin QHS to replace Horizant??  ??  FEN/GI/PPX  Reg diet  PO fluids  Xarelto   Please page [phone] with any questions.      Christian Ledger, MD    Subjective:      Interval History (04/06/19)  Feeling well. Still using peripheral IV due to tenderness with accessing port, plan to re access today.       Objective:      Physical Exam:  Vitals:    04/05/19 1806 04/05/19 1925 04/06/19 0757 04/06/19 1026   BP: 133/90 120/71 118/56    Pulse: 105 106 91 90   Resp: 18 18 18 18    Temp:  36.7 ??C (98.1 ??F) 35.7 ??C (96.3 ??F)    TempSrc:  Oral     SpO2: 98% 98% 97% 97%   Weight:       Height:         General: Alert, well-appearing, and in no distress. RIght chest dressed at removed port site.  Left chest with new  port (not accessed), no erythema or fluctuance, appropriate post-procedural tenderness to palpation   Eyes: Anicteric sclera, conjunctiva clear.  ENT:  Mucous membranes moist and intact.  Lungs: Normal excursion, no dullness to percussion. Good air movement bilaterally, without wheezes or crackles. Normal upper airway sounds without evidence of stridor.  Cardiovascular: Regular rate and rhythm, S1, S2 normal, no murmur, click, rub or gallop appreciated.  Abdomen: Soft, non-tender, not distended, bowel sounds are normal, liver is not enlarged, spleen is not enlarged  Musculoskeletal: No synovitis.  Skin: No rashes or lesions.  Neuro: No focal neurological deficits.    Malnutrition Assessment using AND/ASPEN Clinical Characteristics:           Patient Lines/Drains/Airways Status    Active Active Lines, Drains, & Airways     Name:   Placement date:   Placement time:   Site:   Days:    Port A Cath 05/30/17 Right Chest   05/30/17    1746    Chest   675    Power Port--a-Cath Single Hub 03/27/19 Left Internal jugular   03/27/19    1610    Internal jugular   10    Peripheral IV 04/03/19 Left Antecubital   04/03/19    2000    Antecubital   2              Patient Lines/Drains/Airways Status    Active Wounds     Name:   Placement date:   Placement time:   Site:   Days:    Surgical Site 03/29/17 Chest Right   03/29/17    1346     737    Surgical Site Axilla Right   ???    ???                        Diagnostic Review:   All labs and images were personally reviewed.

## 2019-04-06 NOTE — Unmapped (Signed)
POC discussed with patient.  No falls noted during my shift thus far and I will continue to monitor patient.  IV antibiotics given per orders. Pain controlled with prn medications.    Problem: Adult Inpatient Plan of Care  Goal: Plan of Care Review  Outcome: Progressing  Goal: Patient-Specific Goal (Individualization)  Outcome: Progressing  Goal: Absence of Hospital-Acquired Illness or Injury  Outcome: Progressing  Goal: Optimal Comfort and Wellbeing  Outcome: Progressing  Goal: Readiness for Transition of Care  Outcome: Progressing  Goal: Rounds/Family Conference  Outcome: Progressing     Problem: Diabetes Comorbidity  Goal: Blood Glucose Level Within Desired Range  Outcome: Progressing     Problem: Hypertension Comorbidity  Goal: Blood Pressure in Desired Range  Outcome: Progressing     Problem: Wound  Goal: Optimal Wound Healing  Outcome: Progressing     Problem: Infection  Goal: Infection Symptom Resolution  Outcome: Progressing

## 2019-04-06 NOTE — Unmapped (Signed)
Pt reported he felt like BG was low, despite having a normal BG aprox 30 minutes earlier. VSS and BG was 116. Pt then reported it could just be that he was hungry. Dinner arrived at the same time.     At 1852, pt reports he feels better.

## 2019-04-07 NOTE — Unmapped (Signed)
Pt with no falls or injuries this shift, and able to ambulate in room independently.  Bed low and locked, side rails up x 2, and call bell within reach.  Vitals WNL.  Pt receiving IV antibiotics for CF exacerbation.  Pt with no complaints of pain this shift.  Pt tolerating diet.  Pt to discharge tomorrow per MD Nada Libman.    Problem: Adult Inpatient Plan of Care  Goal: Plan of Care Review  Outcome: Progressing  Goal: Patient-Specific Goal (Individualization)  Outcome: Progressing  Goal: Absence of Hospital-Acquired Illness or Injury  Outcome: Progressing  Goal: Optimal Comfort and Wellbeing  Outcome: Progressing  Goal: Readiness for Transition of Care  Outcome: Progressing  Goal: Rounds/Family Conference  Outcome: Progressing     Problem: Diabetes Comorbidity  Goal: Blood Glucose Level Within Desired Range  Outcome: Progressing     Problem: Hypertension Comorbidity  Goal: Blood Pressure in Desired Range  Outcome: Progressing     Problem: Wound  Goal: Optimal Wound Healing  Outcome: Progressing     Problem: Infection  Goal: Infection Symptom Resolution  Outcome: Progressing

## 2019-04-07 NOTE — Unmapped (Signed)
Pt assisted with needs.Pain under control.POC discussed with questions answered appropriately.Encouraged to call for help when in need.Will keep monitoring.  Problem: Adult Inpatient Plan of Care  Goal: Plan of Care Review  Outcome: Progressing  Goal: Patient-Specific Goal (Individualization)  Outcome: Progressing  Goal: Absence of Hospital-Acquired Illness or Injury  Outcome: Progressing  Goal: Optimal Comfort and Wellbeing  Outcome: Progressing  Goal: Readiness for Transition of Care  Outcome: Progressing  Goal: Rounds/Family Conference  Outcome: Progressing     Problem: Diabetes Comorbidity  Goal: Blood Glucose Level Within Desired Range  Outcome: Progressing     Problem: Hypertension Comorbidity  Goal: Blood Pressure in Desired Range  Outcome: Progressing

## 2019-04-08 NOTE — Unmapped (Signed)
PULMONARY PROGRESS NOTE      Patient: Christian Young(1986/02/21)  Reason for admission: Cystic fibrosis with pulmonary exacerbation (CMS-HCC)     Assessment and Recommendations:      Principal Problem:    Cystic fibrosis with pulmonary exacerbation (CMS-HCC)  Active Problems:    Essential hypertension    Depressive disorder    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)    Pancreatic insufficiency due to cystic fibrosis (CMS-HCC)    History of Mycobacterium abscessus infection    Bronchiectasis (CMS-HCC)    Chronic deep vein thrombosis (DVT) of lower extremity (CMS-HCC)    Restless leg syndrome  Resolved Problems:    * No resolved hospital problems. *    Christian Young is a 33 y.o. male with PMHx as noted below who presents to [facility] with Cystic fibrosis with pulmonary exacerbation (CMS-HCC).  ??  1. CF exacerbation w/ pulmonary manifestations w/ acute exacerbation of bronchiectasis:??Presented with increased cough and sputum production, SOB, as well as low grade fevers. ??COVID pcr is negative. ??His last sputum culture 11/20/2018 showed:??1+ Mucoid Pseudomonas??sensitive to Zosyn, Ceftaz, Aztreonam and??1+ Smooth Pseudomonas????intermediate sensitivity to Tobra and resistant to all else.   - Sputum Culture with resistant pseudomonas  -??Continue??pip/tazo and inhaled tobramycin per primary pulmonologist - PFTs seem to be improving from home despite resistance to zosyn on sputum.  Plan to continue through Tuesday evening to complete 14 days and d/c tomorrow 8/25.  -??Continue??home??Symdeko   - Pulmozyme 2.5 mg BID  - montelukast 10 mg qHS  - cetirizine 10 mg daily  - Symbicort 2 puff BID??or formulary equivalent??  - HTS 7% QID w/ RT  - Aerobika and chest vest QID w/ RT    2. History of Mycobacterium abscessus infection:??Required RULobectomy in August 2018.??Previously??on clofazimine and azithromycin. Plan to repeat AFB smears every 3 months (starting February 2020).??  - AFB sputum culture negative  ??  3. CF-related PI  - Creon????  - MVW 2 capsules daily with additional vit D  ??  ??4. CF-related DM  Last A1c??8.5% (December)  - glargine 70 units qHS  - lispro??55??units TID AC (down from Carb counting dose of 60, increased 8/15)  - SSI  ??  ??5. Depression, anxiety  -??Fluoxetine 40 mg daily  -??Lamotrigine 100 mg daily  ????  6. HTN  -??Lisinopril 10 mg daily  ??  7. History of DVT  -??Rivaroxaban 20 mg daily  ????  8. RLS  -??Pramipexole 0.25 mg daily  -??Gabapentin QHS to replace Horizant??  ??  FEN/GI/PPX  Reg diet  PO fluids  Xarelto   Please page [phone] with any questions.      Pablo Ledger, MD    Subjective:      Interval History (04/07/19)  Feeling well. Excited to go home tomorrow.       Objective:      Physical Exam:  Vitals:    04/06/19 2056 04/07/19 0515 04/07/19 0904 04/07/19 0912   BP:  111/56 126/75    Pulse: 99 82 98 99   Resp: 19 14 17 16    Temp:  36.3 ??C (97.3 ??F) 36.5 ??C (97.7 ??F)    TempSrc:  Oral     SpO2: 100% 99% 98% 98%   Weight:       Height:         General: Alert, well-appearing, and in no distress.     Eyes: Anicteric sclera, conjunctiva clear.  ENT:  Mucous membranes moist and intact.  Lungs: Normal  excursion, no dullness to percussion. Good air movement bilaterally, without wheezes or crackles. Normal upper airway sounds without evidence of stridor.  Cardiovascular: Regular rate and rhythm, S1, S2 normal, no murmur, click, rub or gallop appreciated.  Abdomen: Soft, non-tender, not distended, bowel sounds are normal, liver is not enlarged, spleen is not enlarged  Musculoskeletal: No synovitis.  Skin: No rashes or lesions.  Neuro: No focal neurological deficits.    Malnutrition Assessment using AND/ASPEN Clinical Characteristics:           Patient Lines/Drains/Airways Status    Active Active Lines, Drains, & Airways     Name:   Placement date:   Placement time:   Site:   Days:    Port A Cath 05/30/17 Right Chest   05/30/17    1746    Chest   676    Power Port--a-Cath Single Hub 03/27/19 Left Chest 03/27/19    0907    Chest   11    Peripheral IV 04/03/19 Left Antecubital   04/03/19    2000    Antecubital   3              Patient Lines/Drains/Airways Status    Active Wounds     Name:   Placement date:   Placement time:   Site:   Days:    Surgical Site 03/29/17 Chest Right   03/29/17    1346     739    Surgical Site Axilla Right   ???    ???                        Diagnostic Review:   All labs and images were personally reviewed.

## 2019-04-08 NOTE — Unmapped (Signed)
Christian Young is a 33 y.o. male who was admitted with an acute exacerbation of chronic bronchiectasis in the setting of cystic fibrosis. He was started on IV antibiotics and aggressive airway clearance. His FEV1 was checked and improved to 63% one week into therapy. He completed a 2 week course of antibiotics with IV zosyn and inhaled tobi. During hospitalization port was replaced as had failed.  He has new port in right chest that works.

## 2019-04-08 NOTE — Unmapped (Signed)
Physician Discharge Summary    Admit date: 03/24/2019    Discharge date and time: 04/08/2019    Discharge to: Home    Discharge Service: [facility] pulmonary    Discharge Diagnoses: CF exacerbation    Procedures: [facility] replaced    Hospital Course:  Christian Young is a 33 y.o. male who was admitted with an acute exacerbation of chronic bronchiectasis in the setting of cystic fibrosis. He was started on IV antibiotics and aggressive airway clearance. His FEV1 was checked and improved to 63% one week into therapy. He completed a 2 week course of antibiotics with IV zosyn and inhaled tobi. During hospitalization port was replaced as had failed.  He has new port in right chest that works.       Condition at Discharge: good  Discharge Medications:      Your Medication List      STOP taking these medications    ciprofloxacin HCl 750 MG tablet  Commonly known as: CIPRO        CHANGE how you take these medications    albuterol 90 mcg/actuation inhaler  Commonly known as: PROVENTIL HFA;VENTOLIN HFA  Inhale 2 puffs every six (6) hours as needed.  What changed: Another medication with the same name was changed. Make sure you understand how and when to take each.     albuterol 2.5 mg/0.5 mL nebulizer solution  Inhale 0.5 mL (2.5 mg total) by nebulization every six (6) hours as needed for wheezing.  What changed: when to take this     dornase alfa 1 mg/mL nebulizer solution  Commonly known as: PULMOZYME  Inhale 2.5 mg daily.  What changed: when to take this     MVW Complete (pediatric multivit 61-D3-vit K) 1,500-800 unit-mcg Cap  Take 2 capsules by mouth daily.  What changed:   ?? how much to take  ?? when to take this     MVW COMPLETE FORMUL PROBIOTIC 40 billion cell -15 mg Cpdr  Generic drug: Lacto-Bif-Sac-Bacil-Strep-bact  Take 1 capsule by mouth daily.  What changed: when to take this        CONTINUE taking these medications    amitriptyline 100 MG tablet  Commonly known as: ELAVIL  Take 100 mg by mouth nightly.     BETHKIS 300 mg/4 mL Nebu  Generic drug: tobramycin  Inhale 300 mg every twelve (12) hours. For 28 days, alternating every other 28 days     blood sugar diagnostic Strp  Dispense 150 blood glucose test strips, ok to sub any brand preferred by insurance/patient, use up to 5x/day, dx E 08.9     blood-glucose meter kit  Dispense meter that is preferred by patient's insurance company     blood-glucose meter,continuous Misc  Commonly known as: DEXCOM G6 RECEIVER  Use as directed     blood-glucose sensor Devi  Commonly known as: DEXCOM G6 SENSOR  Use sq as directed every 14 days     blood-glucose transmitter Devi  Commonly known as: DEXCOM G6 TRANSMITTER  Use as directed     budesonide-formoteroL 160-4.5 mcg/actuation inhaler  Commonly known as: SYMBICORT  Inhale 2 puffs Two (2) times a day.     cetirizine 10 MG tablet  Commonly known as: ZyrTEC  TAKE 1 TABLET (10 MG TOTAL) BY MOUTH DAILY.     CREON 36,000-114,000- 180,000 unit Cpdr  Generic drug: lipase-protease-amylase  TAKE 7 TO 8 CAPSULES WITH MEALS AND 5 TO 6 CAPSULES WITH SNACKS     EASY TOUCH LANCING  DEVICE Misc  Generic drug: lancing device  Use as directed.     EASY TOUCH TWIST LANCETS 30 gauge Misc  Generic drug: lancets  Use as directed.     FLUoxetine 20 MG capsule  Commonly known as: PROzac  Take 40 mg by mouth daily.     gabapentin 600 MG tablet  Commonly known as: NEURONTIN  Take 600 mg by mouth Three (3) times a day as needed.     glucagon (human recombinant) 1 mg/ml injection  Commonly known as: glucagon  Inject 1 mL (1 mg total) under the skin once as needed (severe hypoglycemia). Follow package directions for low blood sugar.     insulin ASPART 100 unit/mL (3 mL) injection pen  Commonly known as: NovoLOG FLEXPEN  Use up to 150 units/day, divided TID AC meals     insulin glargine 100 unit/mL (3 mL) injection pen  Commonly known as: BASAGLAR, LANTUS  Inject 0.7 mL (70 Units total) under the skin daily.     lamoTRIgine 200 MG tablet  Commonly known as: LaMICtal  Take 200 mg by mouth daily.     LC PLUS Misc  Generic drug: nebulizers  Use as directed with inhaled medications     lisinopriL 10 MG tablet  Commonly known as: PRINIVIL,ZESTRIL  Take 1 tablet (10 mg total) by mouth daily.     melatonin 10 mg Tab  Take 10 mg by mouth nightly.     montelukast 10 mg tablet  Commonly known as: SINGULAIR  Take 1 tablet (10 mg total) by mouth nightly.     omeprazole 20 MG capsule  Commonly known as: PriLOSEC  Take 1 capsule (20 mg total) by mouth daily.     pen needle, diabetic 32 gauge x 5/32 (4 mm) Ndle  Commonly known as: BD ULTRA-FINE NANO PEN NEEDLE  ok to sub any brand or size needle preferred by insurance/patient, use up to 4x/day, dx E 08.9     pramipexole 0.125 MG tablet  Commonly known as: MIRAPEX  Take 0.25 mg by mouth daily.     sodium chloride 7% 7 % Nebu  Inhale 4 mL by nebulization Two (2) times a day. Increase to 4 times while taking antibiotics     SYMDEKO 100-150 mg (d)/ 150 mg (n) tablet  Generic drug: tezacaftor-ivacaftor  TAKE BY MOUTH AS DIRECTED ON PACKAGE LABELING     traZODone 100 MG tablet  Commonly known as: DESYREL  Take 2 tablets (200 mg total) by mouth nightly.     XARELTO 20 mg tablet  Generic drug: rivaroxaban  Take 20 mg by mouth daily.            Pending Test Results:     Pending Labs     Order Current Status    AFB culture Preliminary result          Discharge Instructions:            I spent greater than 30 minutes in the discharge of this patient.

## 2019-04-08 NOTE — Unmapped (Signed)
Vss; afebrile. C/o pain d/t PIV insertion.  POC continued.    Problem: Adult Inpatient Plan of Care  Goal: Plan of Care Review  Outcome: Progressing  Goal: Patient-Specific Goal (Individualization)  Outcome: Progressing  Goal: Absence of Hospital-Acquired Illness or Injury  Outcome: Progressing  Goal: Optimal Comfort and Wellbeing  Outcome: Progressing  Goal: Readiness for Transition of Care  Outcome: Progressing  Goal: Rounds/Family Conference  Outcome: Progressing     Problem: Diabetes Comorbidity  Goal: Blood Glucose Level Within Desired Range  Outcome: Progressing     Problem: Hypertension Comorbidity  Goal: Blood Pressure in Desired Range  Outcome: Progressing     Problem: Wound  Goal: Optimal Wound Healing  Outcome: Progressing     Problem: Infection  Goal: Infection Symptom Resolution  Outcome: Progressing

## 2019-04-08 NOTE — Unmapped (Signed)
Pt with no falls or injuries this shift, and able to ambulate in room independently.  Bed low and locked, side rails up x 2, and call bell within reach.  Vitals WNL.  Pt finished IV antibiotics this AM for CF exacerbation.  Pt with no complaints of pain.  Pt tolerating diet.  Pt to discharge this morning and waiting on ride now.    Problem: Adult Inpatient Plan of Care  Goal: Plan of Care Review  Outcome: Discharged to Home  Goal: Patient-Specific Goal (Individualization)  Outcome: Discharged to Home  Goal: Absence of Hospital-Acquired Illness or Injury  Outcome: Discharged to Home  Goal: Optimal Comfort and Wellbeing  Outcome: Discharged to Home  Goal: Readiness for Transition of Care  Outcome: Discharged to Home  Goal: Rounds/Family Conference  Outcome: Discharged to Home     Problem: Diabetes Comorbidity  Goal: Blood Glucose Level Within Desired Range  Outcome: Discharged to Home     Problem: Hypertension Comorbidity  Goal: Blood Pressure in Desired Range  Outcome: Discharged to Home     Problem: Wound  Goal: Optimal Wound Healing  Outcome: Discharged to Home     Problem: Infection  Goal: Infection Symptom Resolution  Outcome: Discharged to Home

## 2019-04-09 ENCOUNTER — Ambulatory Visit (INDEPENDENT_AMBULATORY_CARE_PROVIDER_SITE_OTHER): Payer: 59 | Admitting: Psychology

## 2019-04-09 DIAGNOSIS — F330 Major depressive disorder, recurrent, mild: Secondary | ICD-10-CM

## 2019-04-09 DIAGNOSIS — F411 Generalized anxiety disorder: Secondary | ICD-10-CM

## 2019-04-13 NOTE — Unmapped (Signed)
[facility] Clinical Assessment & Refill Coordination Note    Christian Young, [place]: 1985/10/03  Phone: [phone] (home)     All above HIPAA information was verified with patient.     Specialty Medication(s):   CF/Pulmonary: -SYMDEKO (tezacaftor 100mg /ivacaftor 150mg  and ivacaftor 150mg ) tablets     Current Outpatient Medications   Medication Sig Dispense Refill   ??? albuterol (PROVENTIL HFA;VENTOLIN HFA) 90 mcg/actuation inhaler Inhale 2 puffs every six (6) hours as needed. 1 Inhaler 1   ??? albuterol 2.5 mg/0.5 mL nebulizer solution Inhale 0.5 mL (2.5 mg total) by nebulization every six (6) hours as needed for wheezing. (Patient taking differently: Inhale 2.5 mg by nebulization Two (2) times a day. ) 90 each 3   ??? amitriptyline (ELAVIL) 100 MG tablet Take 100 mg by mouth nightly.      ??? blood sugar diagnostic Strp Dispense 150 blood glucose test strips, ok to sub any brand preferred by insurance/patient, use up to 5x/day, dx E 08.9 450 strip 12   ??? blood-glucose meter kit Dispense meter that is preferred by patient's insurance company 1 each 0   ??? blood-glucose meter,continuous (DEXCOM G6 RECEIVER) Misc Use as directed 1 each 0   ??? blood-glucose sensor (DEXCOM G6 SENSOR) Devi Use sq as directed every 14 days 6 Device 11   ??? blood-glucose transmitter (DEXCOM G6 TRANSMITTER) Devi Use as directed 1 Device 11   ??? budesonide-formoterol (SYMBICORT) 160-4.5 mcg/actuation inhaler Inhale 2 puffs Two (2) times a day. 20 g 11   ??? cetirizine (ZYRTEC) 10 MG tablet TAKE 1 TABLET (10 MG TOTAL) BY MOUTH DAILY. 90 tablet 3   ??? CREON 36,000-114,000- 180,000 unit CpDR TAKE 7 TO 8 CAPSULES WITH MEALS AND 5 TO 6 CAPSULES WITH SNACKS 1200 capsule 11   ??? dornase alfa (PULMOZYME) 1 mg/mL nebulizer solution Inhale 2.5 mg daily. (Patient taking differently: Inhale 2.5 mg Two (2) times a day. ) 225 mL 3   ??? EASY TOUCH LANCING DEVICE Misc Use as directed.     ??? EASY TOUCH TWIST LANCETS 30 gauge Misc Use as directed.     ??? FLUoxetine (PROZAC) 20 MG capsule Take 40 mg by mouth daily.     ??? gabapentin (NEURONTIN) 600 MG tablet Take 600 mg by mouth Three (3) times a day as needed.     ??? glucagon, human recombinant, (GLUCAGON) 1 mg/ml injection Inject 1 mL (1 mg total) under the skin once as needed (severe hypoglycemia). Follow package directions for low blood sugar. 1 mL 11   ??? insulin ASPART (NOVOLOG FLEXPEN) 100 unit/mL (3 mL) injection pen Use up to 150 units/day, divided TID AC meals 135 mL 12   ??? insulin glargine (BASAGLAR, LANTUS) 100 unit/mL (3 mL) injection pen Inject 0.7 mL (70 Units total) under the skin daily. 30 mL 0   ??? lamoTRIgine (LAMICTAL) 200 MG tablet Take 200 mg by mouth daily.     ??? lisinopriL (PRINIVIL,ZESTRIL) 10 MG tablet Take 1 tablet (10 mg total) by mouth daily. 30 tablet 0   ??? melatonin 10 mg Tab Take 10 mg by mouth nightly.     ??? montelukast (SINGULAIR) 10 mg tablet Take 1 tablet (10 mg total) by mouth nightly. 90 tablet 3   ??? MVW COMPLETE FORMUL PROBIOTIC 40 billion cell -15 mg CpDR Take 1 capsule by mouth daily. (Patient taking differently: Take 1 capsule by mouth Two (2) times a day. ) 90 capsule 3   ??? MVW  Complete, pediatric multivit 61-D3-vit K, 1,500-800 unit-mcg cap Take 2 capsules by mouth daily. (Patient taking differently: Take 1 capsule by mouth Two (2) times a day. ) 60 capsule 11   ??? nebulizers Misc Use as directed with inhaled medications 4 each 3   ??? omeprazole (PRILOSEC) 20 MG capsule Take 1 capsule (20 mg total) by mouth daily. 30 capsule 0   ??? pen needle, diabetic (BD ULTRA-FINE NANO PEN NEEDLE) 32 gauge x 5/32 Ndle ok to sub any brand or size needle preferred by insurance/patient, use up to 4x/day, dx E 08.9 450 each 12   ??? pramipexole (MIRAPEX) 0.125 MG tablet Take 0.25 mg by mouth daily.      ??? sodium chloride 7% 7 % Nebu Inhale 4 mL by nebulization Two (2) times a day. Increase to 4 times while taking antibiotics 720 mL 3   ??? tezacaftor 100mg /ivacaftor 150mg  and ivacaftor 150mg  (SYMDEKO) tablets TAKE BY MOUTH AS DIRECTED ON PACKAGE LABELING 56 each 11   ??? tobramycin (BETHKIS) 300 mg/4 mL Nebu Inhale 300 mg every twelve (12) hours. For 28 days, alternating every other 28 days 224 mL 6   ??? traZODone (DESYREL) 100 MG tablet Take 2 tablets (200 mg total) by mouth nightly. 60 tablet 0   ??? XARELTO 20 mg tablet Take 20 mg by mouth daily.       No current facility-administered medications for this visit.         Changes to medications: [name] reports no changes at this time.  Declined review of each medication     Allergies   Allergen Reactions   ??? [place] [Aztreonam Lysine] Anaphylaxis   ??? Cefepime Itching and Nausea Only   ??? Other Anaphylaxis and Other (See Comments)     Other reaction(s): Other (See Comments)  Bananas: itchy throat  Slo Bid record from [employer] states anaphylaxis.????Pt states this was from childhood and does not know reaction.  Bananas, causes itchy throat   ??? Slo-Bid 100 Anaphylaxis   ??? Banana Itching and Nausea And Vomiting   ??? Tobramycin Tinnitus     From OSH record-documented as tinnitus but has received IV tobra with close monitoring.       Changes to allergies: No    SPECIALTY MEDICATION ADHERENCE     Symdeko 100/15 mg: 7 days of medicine on hand        Medication Adherence    Patient reported X missed doses in the last month: 2  Specialty Medication: Symdeko 100150 mg   Informant: patient  Support network for adherence: family member          Specialty medication(s) dose(s) confirmed: Regimen is correct and unchanged.     Are there any concerns with adherence? No    Adherence counseling provided? Yes: counseled on importance of not missing doses as it can cause a decline in lung function. Patient states he is feeling a lot better after hospitalization and want to continue feeling good by staying adherant    CLINICAL MANAGEMENT AND INTERVENTION      Clinical Benefit Assessment:    Do you feel the medicine is effective or helping your condition? Yes Clinical Benefit counseling provided? Not needed    Adverse Effects Assessment:    Are you experiencing any side effects? No    Are you experiencing difficulty administering your medicine? No    Quality of Life Assessment:    How many days over the past month did your CF  keep you from your  normal activities? For example, brushing your teeth or getting up in the morning. was admitted for the past week at [facility] but is feelin a lot better    Have you discussed this with your provider? Providers aware    Therapy Appropriateness:    Is therapy appropriate? Yes, therapy is appropriate and should be continued    DISEASE/MEDICATION-SPECIFIC INFORMATION      For CF patients: CF [facility] Active? Yes    PATIENT SPECIFIC NEEDS     ? Does the patient have any physical, cognitive, or cultural barriers? No    ? Is the patient high risk? No     ? Does the patient require a Care Management Plan? No     ? Does the patient require physician intervention or other additional services (i.e. nutrition, smoking cessation, social work)? No      SHIPPING     Specialty Medication(s) to be Shipped:   CF/Pulmonary: -SYMDEKO (tezacaftor 100mg /ivacaftor 150mg  and ivacaftor 150mg ) tablets    Other medication(s) to be shipped: n/a     Changes to insurance: No    Delivery Scheduled: Yes, Expected medication delivery date: 04/15/2019.     Medication will be delivered via UPS to the confirmed home address in [facility].    The patient will receive a drug information handout for each medication shipped and additional FDA Medication Guides as required.  Verified that patient has previously received a Conservation officer, historic buildings.    All of the patient's questions and concerns have been addressed.    Julianne Rice   [facility] Specialty Pharmacist

## 2019-04-14 MED FILL — SYMDEKO 100 MG-150 MG (DAY)/150 MG (NIGHT) TABLETS: 28 days supply | Qty: 56 | Fill #5 | Status: AC

## 2019-04-14 MED FILL — SYMDEKO 100 MG-150 MG (DAY)/150 MG (NIGHT) TABLETS: 28 days supply | Qty: 56 | Fill #5

## 2019-04-20 NOTE — Unmapped (Signed)
ENDOCRINE/DIABETES TELEVISIT (VIDEO VISIT)    I spent 16 minutes on the real-time audio and video with the patient. I spent an additional 5 minutes on pre- and post-visit activities.     The patient was physically located in [place] or a state in which I am permitted to provide care. The patient and/or parent/guardian understood that s/he may incur co-pays and cost sharing, and agreed to the telemedicine visit. The visit was reasonable and appropriate under the circumstances given the patient's presentation at the time.    The patient and/or parent/guardian has been advised of the potential risks and limitations of this mode of treatment (including, but not limited to, the absence of in-person examination) and has agreed to be treated using telemedicine. The patient's/patient's family's questions regarding telemedicine have been answered.     If the visit was completed in an ambulatory setting, the patient and/or parent/guardian has also been advised to contact their provider???s office for worsening conditions, and seek emergency medical treatment and/or call 911 if the patient deems either necessary.      Chief Complaint:     33 y.o. male with a PMH of CF, HTN w CFRD.   No chief complaint on file.    Subjective:     HPI   CFRD dx'd ~2/19.     Last seen by myself 02/16/2019 (video visit d/t covid)  At last visit:   ? Novolog 30 units for meals, 8 units for snacks, + 4:50>150  ? Continue Basaglar, try to keep dose stable, if taking 70 units recently that looks appropriate as we discussed  ? OK to take Basaglar any time ie morning fine as long as taking same time of day each day  ? A1c is ordered in case any labs done in interim  ? We should regroup in about 3 mo for recheck      Since last visit:   My Video call awakened him this AM!  Been feeling well, feels diet could be better, maybe less fast food  Dropped basaglar to 60, taking 30-40 Novolog ac meal  hospitalizeed a few weeks August [facility] for CF exacerbation, was followed remotely by endo svc (I don't see chart notes this is his recall)  Popcorn chicken and french fries last night caused hyperglycemia last night  Oatmeal for breakfast, will have this soon  Vision blurred, no floaters or spots  A1c ordered and I see order but not run during latest hospital stay, unclear why    Currently taking the following for CFRD:   Basaglar Lantus insulin 60 units qhs  Aspart insulin ~30 -40  units ac meals, 8 units ac snacks + 4:50>150    Denies symptoms consistent with peripheral or autonomic neuropathy  Denies recent lows requiring assistance  Denies recent nocturnal lows  DFE scheduled setp 21 local      Continuous glucose monitor (CGM) was computer downloaded, I personally reviewed greater than 72 hours of data and interpreted, and discussed the findings with the patient.   Data summary pasted in CMA note  14 day summary:  Mean glucose = 175 CV = 29%  TIR = 56%  % low = <1%  % high = 42%  Trend of Some prandial excursions, nocturnal hyperglycemia earlier but lately has resolved      ________________________      Initial HPI carried forward.   Patient is a 33 year old male with a PMH of CF, HTN and GAD/MDD who presents for a recurrence  of CFRD.     The patient was initially treated for cystic fibrosis related diabetes (CFRD) when he received his care at [facility] with 5 units of levemir at night. Subsequently, after moving to [place], he was taken off of his diabetes medications which was approximately 3 years ago. Since then he has continued to check his blood sugar approximately three times per day. Recently, his blood sugars have been typically running 200's - 300's. He was scheduled for endocrinology assessment for CFRD and prior to today's appointment, his hemoglobin A1c was found to be 7.6%. Upon arrival to the clinic today, his POCT glucose was 306 mg/dL.     He denies any regular hypglycemic episodes. If he does not eat much, he reports that he will occasionally dip lower than usual (approxiamtely once per week his blood glucose will run <100 mg/dL and approximately once per month his blood glucose will be <60 mg/dL). He states that recently, however, the lowest he has seen is 160 mg/dL. He endorses occasional blurriness in his vision and denies any neuropathic symptoms in his feet. He requires a new meter. His cystic fibrosis affects his lungs, sinuses and GI tract; he endorses pancreatic involvement. He does not appear malnourished. Most recently he was hospitalized in January 2019 for a CF-bronchiectasis exacerbation. He had a RUL lobectomy in August 2018. For his HTN, he takes lisinopril and carvedilol and he takes lamotrigine for his GAD and MDD.    The past medical history, surgical history, family history, social history, medications and allergies were reviewed in Epic.    Past Medical History:   Diagnosis Date   ??? Anxiety    ??? Chronic pain disorder    ??? Cystic fibrosis (CMS-HCC)    ??? Depression    ??? Hypertension    ??? Nonproductive cough 04/05/2018     Past Surgical History:   Procedure Laterality Date   ??? IR INSERT PORT AGE GREATER THAN 5 YRS  03/27/2019    IR INSERT PORT AGE GREATER THAN 5 YRS 03/27/2019 Rush Barer, MD IMG VIR [facility]   ??? PR REMOVAL OF LUNG,LOBECTOMY Right 03/29/2017    Procedure: REMOVAL OF LUNG, OTHER THAN PNEUMONECTOMY; SINGLE LOBE (LOBECTOMY);  Surgeon: Cherie Dark, MD;  Location: MAIN [facility];  Service: Thoracic     Current Outpatient Medications on File Prior to Visit   Medication Sig Dispense Refill   ??? albuterol (PROVENTIL HFA;VENTOLIN HFA) 90 mcg/actuation inhaler Inhale 2 puffs every six (6) hours as needed. 1 Inhaler 1   ??? albuterol 2.5 mg/0.5 mL nebulizer solution Inhale 0.5 mL (2.5 mg total) by nebulization every six (6) hours as needed for wheezing. (Patient taking differently: Inhale 2.5 mg by nebulization Two (2) times a day. ) 90 each 3   ??? amitriptyline (ELAVIL) 100 MG tablet Take 100 mg by mouth nightly.      ??? blood sugar diagnostic Strp Dispense 150 blood glucose test strips, ok to sub any brand preferred by insurance/patient, use up to 5x/day, dx E 08.9 450 strip 12   ??? blood-glucose meter kit Dispense meter that is preferred by patient's insurance company 1 each 0   ??? blood-glucose meter,continuous (DEXCOM G6 RECEIVER) Misc Use as directed 1 each 0   ??? blood-glucose sensor (DEXCOM G6 SENSOR) Devi Use sq as directed every 14 days 6 Device 11   ??? blood-glucose transmitter (DEXCOM G6 TRANSMITTER) Devi Use as directed 1 Device 11   ??? budesonide-formoterol (SYMBICORT) 160-4.5 mcg/actuation inhaler Inhale 2  puffs Two (2) times a day. 20 g 11   ??? cetirizine (ZYRTEC) 10 MG tablet TAKE 1 TABLET (10 MG TOTAL) BY MOUTH DAILY. 90 tablet 3   ??? CREON 36,000-114,000- 180,000 unit CpDR TAKE 7 TO 8 CAPSULES WITH MEALS AND 5 TO 6 CAPSULES WITH SNACKS 1200 capsule 11   ??? dornase alfa (PULMOZYME) 1 mg/mL nebulizer solution Inhale 2.5 mg daily. (Patient taking differently: Inhale 2.5 mg Two (2) times a day. ) 225 mL 3   ??? EASY TOUCH LANCING DEVICE Misc Use as directed.     ??? EASY TOUCH TWIST LANCETS 30 gauge Misc Use as directed.     ??? FLUoxetine (PROZAC) 20 MG capsule Take 40 mg by mouth daily.     ??? gabapentin (NEURONTIN) 600 MG tablet Take 600 mg by mouth Three (3) times a day as needed.     ??? glucagon, human recombinant, (GLUCAGON) 1 mg/ml injection Inject 1 mL (1 mg total) under the skin once as needed (severe hypoglycemia). Follow package directions for low blood sugar. 1 mL 11   ??? insulin ASPART (NOVOLOG FLEXPEN) 100 unit/mL (3 mL) injection pen Use up to 150 units/day, divided TID AC meals 135 mL 12   ??? insulin glargine (BASAGLAR, LANTUS) 100 unit/mL (3 mL) injection pen Inject 0.7 mL (70 Units total) under the skin daily. 30 mL 0   ??? lamoTRIgine (LAMICTAL) 200 MG tablet Take 200 mg by mouth daily.     ??? lisinopriL (PRINIVIL,ZESTRIL) 10 MG tablet Take 1 tablet (10 mg total) by mouth daily. 30 tablet 0   ??? melatonin 10 mg Tab Take 10 mg by mouth nightly.     ??? montelukast (SINGULAIR) 10 mg tablet Take 1 tablet (10 mg total) by mouth nightly. 90 tablet 3   ??? MVW COMPLETE FORMUL PROBIOTIC 40 billion cell -15 mg CpDR Take 1 capsule by mouth daily. (Patient taking differently: Take 1 capsule by mouth Two (2) times a day. ) 90 capsule 3   ??? MVW Complete, pediatric multivit 61-D3-vit K, 1,500-800 unit-mcg cap Take 2 capsules by mouth daily. (Patient taking differently: Take 1 capsule by mouth Two (2) times a day. ) 60 capsule 11   ??? nebulizers Misc Use as directed with inhaled medications 4 each 3   ??? omeprazole (PRILOSEC) 20 MG capsule Take 1 capsule (20 mg total) by mouth daily. 30 capsule 0   ??? pen needle, diabetic (BD ULTRA-FINE NANO PEN NEEDLE) 32 gauge x 5/32 Ndle ok to sub any brand or size needle preferred by insurance/patient, use up to 4x/day, dx E 08.9 450 each 12   ??? pramipexole (MIRAPEX) 0.125 MG tablet Take 0.25 mg by mouth daily.      ??? sodium chloride 7% 7 % Nebu Inhale 4 mL by nebulization Two (2) times a day. Increase to 4 times while taking antibiotics 720 mL 3   ??? tezacaftor 100mg /ivacaftor 150mg  and ivacaftor 150mg  (SYMDEKO) tablets TAKE BY MOUTH AS DIRECTED ON PACKAGE LABELING 56 each 11   ??? tobramycin (BETHKIS) 300 mg/4 mL Nebu Inhale 300 mg every twelve (12) hours. For 28 days, alternating every other 28 days 224 mL 6   ??? traZODone (DESYREL) 100 MG tablet Take 2 tablets (200 mg total) by mouth nightly. 60 tablet 0   ??? XARELTO 20 mg tablet Take 20 mg by mouth daily.     ??? [DISCONTINUED] insulin detemir U-100 (LEVEMIR FLEXTOUCH U-100 INSULN) 100 unit/mL (3 mL) injection pen Use up to 30 units per  day, as per MD instructions 30 mL 12     No current facility-administered medications on file prior to visit.      SOCIAL HISTORY:  Social History     Social History Narrative   ??? Not on file       FAMILY HISTORY:  Family History   Problem Relation Age of Onset   ??? Bipolar disorder Mother    ??? Depression Mother      ROS:  The balance of 10 systems was reviewed and unremarkable except as stated above.     Objective:   There were no vitals taken for this visit.    Alert and oriented with appropriate mood and affect, appears well and in no distress and in good spirits on video  Remainder not done today d/t virtual visit  Carrying previous exam forward for future reference:  [NECK: Supple without carotid bruits, thyromegaly or lymphadenopathy.   HEART: Regular rate and rhythm without murmur, rub, gallop.  ABDOMEN: Soft and nontender without masses.   EXTREMITIES: Showed no edema    REVIEW OF RECENT PERTINENT LABORATORY AND IMAGING DATA:    Lab Results   Component Value Date    A1C 8.5 (H) 08/12/2018    A1C 10.7 (H) 04/06/2018    A1C 9.0 (H) 01/07/2018    A1C 6.1 (H) 11/19/2016    A1C 6.3 (H) 11/18/2016     Lab Results   Component Value Date    CREATININE 0.89 04/06/2019     Lab Results   Component Value Date    GFRNONAA >=60 01/08/2018     Lab Results   Component Value Date    GFRAA >=60 01/08/2018     Lab Results   Component Value Date    K 4.4 04/06/2019     No results found for: CHOL  No results found for: LDL  No results found for: HDL  No results found for: TRIG  Lab Results   Component Value Date    ALT 59 (H) 04/06/2019     No results found for: LABCREA, ALBQTUR, MSHCGMOM, ALBCRERAT  No results found for: VITAMINB12  Lab Results   Component Value Date    TSH 2.120 11/18/2016     Lab Results   Component Value Date    WBC 6.3 04/06/2019    HGB 12.6 (L) 04/06/2019    HCT 39.2 (L) 04/06/2019    PLT 303 04/06/2019     Lab Results   Component Value Date    VITDTOTAL 27.9 03/19/2017     Lab Results   Component Value Date    CALCIUM 9.0 04/06/2019       Assessment/Plan:   1. Diabetes mellitus related to CF (cystic fibrosis) (CMS-HCC)  -He's doing well w insulin dosing and CGM, latest CGM data implies basal insulin dose is appropriate given recent stability overnight  -Hyperglycemia on CGM, A1c ordered and I see order but not run during latest hospital stay, unclear why  -Previously discussed details of pump therapy, he'd be a good candidate, might benefit from CHO counting for prandial dosing, not critical in my opinion however given ~glycemic stability  Patient Instructions   It was a pleasure to see you today!  ? Blood sugars very stable overnight, some spikes post meal  ? Intensify the high blood sugar alarm currently at 390 reduce it to 250-290 range   ? If spikes post meal increase base Novolog from 30-40 to 35-45 and take 10-20 min before eating  ?  See you in 3 mo, A1c is ordered for you  ? Eye exam as schedule in Sept :)    2. Encounter for long-term (current) use of insulin (CMS-HCC)  As above    3. Obesity  Noted, encouraged dietary efforts, though need to balance against potential for malabsorption/caloric deficits related to CF    Follow up recommended in about 3 mo, he is aware to contact us or return sooner for interim concerns.

## 2019-04-20 NOTE — Unmapped (Addendum)
It was a pleasure to see you today!  ? Blood sugars very stable overnight, some spikes post meal  ? Intensify the high blood sugar alarm currently at 390 reduce it to 250-290 range   ? If spikes post meal increase base Novolog from 30-40 to 35-45 and take 10-20 min before eating  ? See you in 3 mo, A1c is ordered for you  ? Eye exam as schedule in Sept :)

## 2019-04-21 NOTE — Unmapped (Signed)
Dexcom linked to clinic.

## 2019-04-23 ENCOUNTER — Ambulatory Visit (INDEPENDENT_AMBULATORY_CARE_PROVIDER_SITE_OTHER): Payer: 59 | Admitting: Psychology

## 2019-04-23 ENCOUNTER — Telehealth
Admit: 2019-04-23 | Discharge: 2019-04-24 | Payer: PRIVATE HEALTH INSURANCE | Attending: "Endocrinology | Primary: "Endocrinology

## 2019-04-23 DIAGNOSIS — F411 Generalized anxiety disorder: Secondary | ICD-10-CM | POA: Diagnosis not present

## 2019-04-23 DIAGNOSIS — F330 Major depressive disorder, recurrent, mild: Secondary | ICD-10-CM | POA: Diagnosis not present

## 2019-04-23 DIAGNOSIS — E11649 Type 2 diabetes mellitus with hypoglycemia without coma: Secondary | ICD-10-CM

## 2019-04-23 DIAGNOSIS — Z794 Long term (current) use of insulin: Secondary | ICD-10-CM

## 2019-04-23 DIAGNOSIS — E089 Diabetes mellitus due to underlying condition without complications: Secondary | ICD-10-CM

## 2019-04-28 DIAGNOSIS — Z95828 Presence of other vascular implants and grafts: Secondary | ICD-10-CM
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
## 2019-05-01 NOTE — Unmapped (Signed)
Thank you :)

## 2019-05-05 DIAGNOSIS — J324 Chronic pansinusitis: Secondary | ICD-10-CM

## 2019-05-05 MED ORDER — MONTELUKAST 10 MG TABLET
ORAL_TABLET | Freq: Every evening | ORAL | 3 refills | 90 days | Status: CP
Start: 2019-05-05 — End: 2020-05-04

## 2019-05-05 NOTE — Unmapped (Signed)
[facility] Pharm[facility] Refill Coordination Note    Specialty Medication(s) to be Shipped:   CF/Pulmonary: -SYMDEKO (tezacaftor 100mg /ivacaftor 150mg  and ivacaftor 150mg ) tablets     Christian Young, DOB: 1986-01-02  Phone: [phone] (home)     All above HIPAA information was verified with patient.     Completed refill call assessment today to schedule patient's medication shipment from the [facility] [phone]).       Specialty medication(s) and dose(s) confirmed: Regimen is correct and unchanged.   Changes to medications: Christian Young reports no changes at this time.  Changes to insurance: No  Questions for the pharmacist: No    Confirmed patient received Welcome Packet with first shipment. The patient will receive a drug information handout for each medication shipped and additional FDA Medication Guides as required.       DISEASE/MEDICATION-SPECIFIC INFORMATION        For CF patients: CF Healthwell Grant Active? Yes, **HWG active VST til 05/06/20**    SPECIALTY MEDICATION ADHERENCE     Medication Adherence    Patient reported X missed doses in the last month: 0  Specialty Medication: Symdeko 100-150mg   Patient is on additional specialty medications: No  Support network for adherence: family member        Symdeko 100-150 mg: 12 days of medicine on hand     SHIPPING     Shipping address confirmed in Epic.     Delivery Scheduled: Yes, Expected medication delivery date: 05/13/19.     Medication will be delivered via UPS to the home address in Epic [place].    Christian Young   [facility] Specialty Technician

## 2019-05-07 ENCOUNTER — Ambulatory Visit (INDEPENDENT_AMBULATORY_CARE_PROVIDER_SITE_OTHER): Payer: 59 | Admitting: Psychology

## 2019-05-07 DIAGNOSIS — F330 Major depressive disorder, recurrent, mild: Secondary | ICD-10-CM | POA: Diagnosis not present

## 2019-05-07 DIAGNOSIS — F411 Generalized anxiety disorder: Secondary | ICD-10-CM

## 2019-05-12 DIAGNOSIS — Z2239 Carrier of other specified bacterial diseases: Secondary | ICD-10-CM

## 2019-05-12 MED ORDER — DEXCOM G6 SENSOR DEVICE
11 refills | 0 days | Status: CP
Start: 2019-05-12 — End: ?

## 2019-05-12 MED ORDER — DEXCOM G6 TRANSMITTER DEVICE
11 refills | 0 days | Status: CP
Start: 2019-05-12 — End: ?

## 2019-05-12 MED ORDER — TOBRAMYCIN 300 MG/5 ML IN 0.225 % SODIUM CHLORIDE FOR NEBULIZATION
Freq: Two times a day (BID) | RESPIRATORY_TRACT | 6 refills | 28 days | Status: CP
Start: 2019-05-12 — End: 2019-05-15

## 2019-05-12 MED ORDER — OMEPRAZOLE 20 MG TABLET,DELAYED RELEASE
ORAL_TABLET | Freq: Every day | ORAL | 3 refills | 90 days | Status: CP
Start: 2019-05-12 — End: 2020-05-11

## 2019-05-12 MED ORDER — BUDESONIDE-FORMOTEROL HFA 160 MCG-4.5 MCG/ACTUATION AEROSOL INHALER
Freq: Two times a day (BID) | RESPIRATORY_TRACT | 3 refills | 0 days | Status: CP
Start: 2019-05-12 — End: 2020-05-11

## 2019-05-12 MED FILL — SYMDEKO 100 MG-150 MG (DAY)/150 MG (NIGHT) TABLETS: 28 days supply | Qty: 56 | Fill #6

## 2019-05-12 MED FILL — SYMDEKO 100 MG-150 MG (DAY)/150 MG (NIGHT) TABLETS: 28 days supply | Qty: 56 | Fill #6 | Status: AC

## 2019-05-12 NOTE — Unmapped (Signed)
Received a call from [facility] team. Requesting a return call at [phone]. They note that they are requesting a new prescription for Holbert's tobramycin nebs, requesting generic.     New prescription sent in electronically.    [name], RN  CF Nurse Coordinator   [phone]

## 2019-05-12 NOTE — Unmapped (Signed)
Pulmonary clinic received faxs from [facility] requesting budesonide-formoterol fumarate and omeprazole Dr 20mg  capsule. New prescriptions sent in electronically per protocol.    [name], RN  CF Nurse Coordinator   [phone]

## 2019-05-12 NOTE — Unmapped (Signed)
[facility] phoned the clinic nurse line.    They stated they are in need of refill of Bethkis for patient but it needs to be the generic.     Forwarded voicemail to [employer] Nurse line and routing message to [facility] Nurse Coordinators.

## 2019-05-12 NOTE — Unmapped (Signed)
Returned call to pharmacy, confirmed receipt of medications e-prescribed earlier this afternoon.    [name], RN  CF Nurse Coordinator   [phone]

## 2019-05-12 NOTE — Unmapped (Signed)
Pharmacy phoned the clinic nurse line requesting refills on omeprazole and symbicort for patient.     They were hoping to send out his medications today if possible.     Forwarded message to [facility] Nurse Line routing message to [facility] Nurses,

## 2019-05-14 NOTE — Unmapped (Signed)
[facility] phoned and stated that there was a PA required for patient's Tobramycin.    Number for PA: [phone]    Pharmacy contact: [phone]    Forwarded voicemail to [facility] Nurse line and routing message to [facility] Coordinators.

## 2019-05-15 ENCOUNTER — Ambulatory Visit (INDEPENDENT_AMBULATORY_CARE_PROVIDER_SITE_OTHER): Payer: 59 | Admitting: Psychology

## 2019-05-15 DIAGNOSIS — F330 Major depressive disorder, recurrent, mild: Secondary | ICD-10-CM | POA: Diagnosis not present

## 2019-05-15 DIAGNOSIS — F411 Generalized anxiety disorder: Secondary | ICD-10-CM | POA: Diagnosis not present

## 2019-05-15 DIAGNOSIS — Z2239 Carrier of other specified bacterial diseases: Secondary | ICD-10-CM

## 2019-05-15 MED ORDER — DEXCOM G6 RECEIVER MISC
0 refills | 0 days | Status: CP
Start: 2019-05-15 — End: ?

## 2019-05-15 MED ORDER — BETHKIS 300 MG/4 ML SOLUTION FOR NEBULIZATION
Freq: Two times a day (BID) | RESPIRATORY_TRACT | 6 refills | 0 days | Status: CP
Start: 2019-05-15 — End: 2019-05-18

## 2019-05-15 NOTE — Unmapped (Signed)
Jyden left a message with the CF nurse coordinators. Reports loosing and ultimately finding his Dexcom receiver, but it has been washed and is no longer working.     Requesting a new Dexcom received to be sent in by either his endocrine provider or Dr. [name].     Will route request to clinic staff in endocrine and Dr. [name].     [name], RN  CF Nurse Coordinator   [phone]

## 2019-05-18 DIAGNOSIS — J328 Other chronic sinusitis: Secondary | ICD-10-CM

## 2019-05-18 DIAGNOSIS — Z2239 Carrier of other specified bacterial diseases: Secondary | ICD-10-CM

## 2019-05-18 MED ORDER — CIPROFLOXACIN 750 MG TABLET
ORAL_TABLET | Freq: Two times a day (BID) | ORAL | 0 refills | 14 days | Status: CP
Start: 2019-05-18 — End: 2019-06-01

## 2019-05-18 MED ORDER — SODIUM BICARBONATE-SODIUM CHLORIDE-NETI POT NASAL RINSE WITH PACKET
Freq: Two times a day (BID) | ENDOSINUSIAL | 11 refills | 0 days | Status: CP
Start: 2019-05-18 — End: 2020-05-17

## 2019-05-18 MED ORDER — BETHKIS 300 MG/4 ML SOLUTION FOR NEBULIZATION
6 refills | 0 days | Status: CP
Start: 2019-05-18 — End: ?

## 2019-05-18 NOTE — Unmapped (Signed)
Addended by: Cicero Duck on: 05/18/2019 12:59 PM     Modules accepted: Orders

## 2019-05-18 NOTE — Unmapped (Signed)
Dr [name] agreeable to sending in ciprofloxacin 750mg  BID x 14 days. This was e-scripted along with [employer] sinus rinse packets.     Reviewed this with [name] who verbalized understanding and appreciation. Requested that Michal be in touch via MyChart in a few days to update [name] on how he is feeling.    [name], RN  CF Nurse Coordinator   [phone]

## 2019-05-18 NOTE — Unmapped (Addendum)
Christian Young his provider via the Pulmonary Clinic Staff pool on Friday evening at 7:32pm:    So hello Dr [name],   ??  I have been having a lot of sinus problem headaches, server ear pain, sinus pressure and a little cough. So i was wondering if you thought we could call in an oral antibiotics or what you were thinking.      Date of symptom onset: about Wednesday. Stuffy nose and post-nasal drip (sore throat in the mornings). Headaches, and sinus pressure and then ears.  Denies symptoms moving into his chest. Just started tobramycin neb course (Friday).  Oney notes being around someone with a sinus infection. Denies any COVID exposures.     General Symptoms:  Fever:     no   Chills:     no   Nightsweats:    yes (a few nights) not change the sheets kind of sweats   Change in appetite:   no   Change in Energy Level:  yes, just a little     Pulmonary Symptoms:    Increased cough:   no   Cough Mild or Significant:  mild   Coughing at Night?    no   Cough:     cough, not all the time, just once in a while during the day (dry--> productive)     Sputum Description     light green    Hemoptysis     no   Chest pain    no   Chest tightness   yes (might be related to starting tobramycin nebs)    Wheezing:     no   Short of breath with activity:     yes, when he takes things to the laundry room Christian Young gets out of breath and going up stairs.      Sinus Symptoms:   Sinus Drainage:   yes, dark green (grossness)    Sinus Pressure:   yes (forehead, cheeks, the usual)   Performing Nasal Irrigations:   no (threw away [employer])    Using Nasal Steroid:   no   Allergy Medications:   yes    Neb Treatments:   Albuterol:     two times a day  Requiring additional albuterol:  yes (Aluberol MDI, once while out going to the store)   Hypertonic Saline:    two times a day  Pulmozyme:     two times a day  Currently on inhaled antibiotics:   yes         Tobramycin Neb       Inhaled Steroids     Symbicort    Airway Clearance: Vest       Preferred Pharmacy:  [facility]    Sick note routed to Dr [name]. [name], RN  CF Nurse Coordinator   [phone]

## 2019-05-23 MED ORDER — PEN NEEDLE, DIABETIC 32 GAUGE X 5/32" (4 MM)
12 refills | 0 days | Status: CP
Start: 2019-05-23 — End: ?

## 2019-05-26 ENCOUNTER — Ambulatory Visit (INDEPENDENT_AMBULATORY_CARE_PROVIDER_SITE_OTHER): Payer: 59 | Admitting: Psychology

## 2019-05-26 DIAGNOSIS — F411 Generalized anxiety disorder: Secondary | ICD-10-CM | POA: Diagnosis not present

## 2019-05-26 DIAGNOSIS — F330 Major depressive disorder, recurrent, mild: Secondary | ICD-10-CM | POA: Diagnosis not present

## 2019-06-02 NOTE — Unmapped (Signed)
[facility] Refill Coordination Note    Specialty Medication(s) to be Shipped:   CF/Pulmonary: -SYMDEKO (tezacaftor 100mg /ivacaftor 150mg  and ivacaftor 150mg ) tablets     Christian Young, DOB: 12/11/1985  Phone: [phone] (home)     All above HIPAA information was verified with patient.     Completed refill call assessment today to schedule patient's medication shipment from the [facility] [phone]).       Specialty medication(s) and dose(s) confirmed: Regimen is correct and unchanged.   Changes to medications: Christian Young reports no changes at this time.  Changes to insurance: No  Questions for the pharmacist: No    Confirmed patient received Welcome Packet with first shipment. The patient will receive a drug information handout for each medication shipped and additional FDA Medication Guides as required.       DISEASE/MEDICATION-SPECIFIC INFORMATION        For CF patients: CF Healthwell Grant Active? Yes, **HWG active VST til 05/06/20**    SPECIALTY MEDICATION ADHERENCE     Medication Adherence    Patient reported X missed doses in the last month: 0  Specialty Medication: Symdeko 100-150mg   Patient is on additional specialty medications: No  Informant: patient  Reliability of informant: reliable  Support network for adherence: family member        Symdeko 100-150 mg: 10 days of medicine on hand     SHIPPING     Shipping address confirmed in Epic.     Delivery Scheduled: Yes, Expected medication delivery date: 06/10/2019.     Medication will be delivered via UPS to the home address in Epic [place].    Christian Young   [facility] Shared [facility] Specialty Technician

## 2019-06-09 MED FILL — SYMDEKO 100 MG-150 MG (DAY)/150 MG (NIGHT) TABLETS: 28 days supply | Qty: 56 | Fill #7

## 2019-06-09 MED FILL — SYMDEKO 100 MG-150 MG (DAY)/150 MG (NIGHT) TABLETS: 28 days supply | Qty: 56 | Fill #7 | Status: AC

## 2019-06-11 NOTE — Unmapped (Signed)
[place] [facility]    Home Spirometry Monitoring      06/11/19        I have reviewed 1 home spirometry test(s) over prior calendar month.  Most recent test demonstrates moderate restriction.  Compared to prior home spirometry values, the most recent test is better. Best in-clinic FEV1 in last year was 2.98 L (63%) on 06/19/18.     Test performed on 05/25/19:        Quality of most recent test graded E.     Follow-up plan:   ? Reached out to patient via MyChart.  ? Recommendations: Continue routine monitoring    Renne Musca  [facility]    [phone]

## 2019-06-11 NOTE — Unmapped (Signed)
[place] [facility]    Home Spirometry Monitoring      Thanks for using your home spirometer!    We have reviewed your most recent test results, and they are Stable from baseline. Your best in-clinic FEV1 in the last year was 2.98 L (63%) on 06/19/18.     We recommend: Continue to use your home spirometer at least monthly    Reminders about your home spirometer:   - Follow the prompts and blow out for at least 6 seconds  - Perform at least 3 trials with each use   - Clean your home spirometer after each use according to instructions    If you have any questions, please contact us via MyChart or call our office:   ? For last name A-K Joni Reining): [phone]   ? For last name Everlean Cherry Harriett Sine): [phone]

## 2019-06-12 ENCOUNTER — Ambulatory Visit: Payer: Self-pay | Admitting: Psychology

## 2019-06-16 NOTE — Unmapped (Signed)
[place] [facility]    Home Spirometry Set Up      Thanks for reviewing how to use your home spirometer!    ? Your best in-clinic FEV1 in the last year was 2.98 L (63%) on 06/20/18.   ? To establish a baseline, use your home spirometer at least 3 times in the first week.   ? After that, use your home spirometer every 1-2 weeks, and as needed.   ?? How to handle changes in FEV1:   ? If you notice a drop in your FEV1 without any new symptoms, repeat test daily for 3 days to see if the change persists.  ? If you notice a drop in your FEV1 by 10% that does not reverse itself on re-testing, or that is accompanied by symptoms, please contact us.  ? Remember to use the spirometer at least 30 minutes after doing your routine airway clearance (e.g. hypertonic saline, vest) and/or bronchodilator treatments (e.g. albuterol).   ? We will be reviewing your test results on a monthly basis, but if you'd like, you can directly email your results to [name] by clicking the Send Results button.   ? Please clean your spirometer after each use according to the user guide.     If you have any questions, please send [name] a MyChart message or call our office:   ? For last name A-K Joni Reining): [phone]   ? For last name Everlean Cherry Harriett Sine): [phone]

## 2019-06-16 NOTE — Unmapped (Signed)
[place] [facility]    Home Spirometry Set Up Encounter       06/16/19        ? Instructed patient on set up, use, and care of their [employer] home spirometer.  ? Reviewed baseline PFT data. Their best in-clinic FEV1 in the last year was 2.98 L (63%) on 06/20/18.   ? Advised patient to establish a baseline by taking at least 3 tests in the first week of use. After that, advised patient to take a test every 1-2 weeks, and as needed.   ?? Reviewed how to handle changes in FEV1:   ? If there is a drop in FEV1 without any new symptoms, patient should repeat test daily for 3 days to see if change persists.  ? If there is a drop in FEV1 by 10% that does not reverse itself on re-testing, or that is accompanied by symptoms, patient should contact their care team.  ? I explained to the patient that s/he may be responsible for a small copayment for home based spirometry. S/he agrees and wishes to proceed.   ? Answered all questions.     Durenda Hurt [place], PA-C  [facility] Adult [facility]rosis [facility]   [phone]

## 2019-06-23 MED ORDER — DORNASE ALFA 1 MG/ML SOLUTION FOR INHALATION
Freq: Two times a day (BID) | RESPIRATORY_TRACT | 3 refills | 90.00000 days | Status: CP
Start: 2019-06-23 — End: 2020-06-22

## 2019-06-26 NOTE — Unmapped (Signed)
Discussed with Dr [name]. He recommends starting Cipro 750mg  BID x14 days. Starting Flonase (fluticasone), resuming nasal irrigations, starting either Allegra or Zyrtec daily.  Prescriptions sent in electronically to patient's requested local pharmacy.     Recommended twice a day nasal irrigations twice daily.  He also recommends getting tested for COVID, as Jaymes works in [employer].     Reviewed the above with [name] over the phone. Agreeable to the plan. Did not feel like he needed to discuss further with Dr [name]. Will also send Tabari a note through MyChart so he has the plan in writing and can review PRN.     [name], RN  CF Nurse Coordinator   [phone]

## 2019-06-26 NOTE — Unmapped (Signed)
Addended by: Cicero Duck on: 06/26/2019 11:07 AM     Modules accepted: Orders

## 2019-06-26 NOTE — Unmapped (Signed)
Christian Young left a voicemail with the CF nurse coordinator office. States he woke up this morning and is feeling pretty awful. Notes he recently started a job, and wondering if Dr [name] can call him in an antibiotic before this gets too bad.     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     Date of symptom onset:  Feeling poorly for the past two to three days. Post-nasal drip, chest tightness, and increased cough with sputum production.     Wife notes energy level and hurting in his upper lobes are her main concerns. He is still able to get up and move about. He normally has a good energy level and she has noted that has decreased. Family is currently healthy, but again working in the public.     General Symptoms:  Fever:     no   Chills:     warm periodically during the day    Nightsweats:    yes, unsure if it was the temperature int he room    Change in appetite:   no   Change in Energy Level:  yes, less energy     Pulmonary Symptoms:    Increased cough:   yes   Coughing at Night?    no   Cough:     both dry and productive     Sputum Description     light green (notes it has been pretty thick)    Hemoptysis     no   Chest pain    upper back/lung pain left> right    Chest tightness   yes   Wheezing:     yes (noticeable wheezing)    Short of breath with activity:     yes, felt yesterday and past two to three days, breath goes away quick       Sinus Symptoms:  Notes post-nasal drip. Notes after work, when driving home notes a large amount nasal/sinus drainage goes to the back of his throat that he swallows.  Denies sinus pressure or pain or headaches. Notes has not been the best at performing nasal irrigations recently, but has supplies available.     Neb Treatments: Work has cut into his evening treatment compliance. Jovon typically works from either 1130-8:00 PM  or 1-9pm. Notes is has been harder to work in the night treatments because he is so exhausted.  Wife notes treatments and vest therapy are likely 4/7 days a week that he does them.      Albuterol:     two times a day  Hypertonic Saline:    two times a day  Pulmozyme:     two times a day  Currently on inhaled antibiotics:   no, currently on an off month         Tobramycin Neb       Inhaled Steroids     Symbicort    Airway Clearance:      Vest with neb treatments    Preferred Pharmacy:   [facility]     Note routed to Dr [name].     [name], RN  CF Nurse Coordinator   [phone]

## 2019-07-06 NOTE — Unmapped (Signed)
[facility] Pharm[facility] Refill Coordination Note    Specialty Medication(s) to be Shipped:   CF/Pulmonary: -SYMDEKO (tezacaftor 100mg /ivacaftor 150mg  and ivacaftor 150mg ) tablets     Christian Young, DOB: Dec 08, 1985  Phone: [phone] (home)     All above HIPAA information was verified with patient.     Completed refill call assessment today to schedule patient's medication shipment from the [facility] [phone]).       Specialty medication(s) and dose(s) confirmed: Regimen is correct and unchanged.   Changes to medications: Christian Young reports no changes at this time.  Changes to insurance: No  Questions for the pharmacist: No    Confirmed patient received Welcome Packet with first shipment. The patient will receive a drug information handout for each medication shipped and additional FDA Medication Guides as required.       DISEASE/MEDICATION-SPECIFIC INFORMATION        For CF patients: CF Healthwell Grant Active? Yes, **HWG active VST til 05/06/20**    SPECIALTY MEDICATION ADHERENCE     Medication Adherence    Patient reported X missed doses in the last month: 0  Specialty Medication: Symdeko 100-150mg   Patient is on additional specialty medications: No  Informant: patient  Reliability of informant: reliable  Support network for adherence: family member        Symdeko 100-150 mg: 11 days of medicine on hand     SHIPPING     Shipping address confirmed in Epic.     Delivery Scheduled: Yes, Expected medication delivery date: 07/16/2019.     Medication will be delivered via UPS to the home address in Epic [place].    Keland Peyton P Allena Katz   [facility] Shared [facility] Specialty Technician

## 2019-07-17 MED FILL — SYMDEKO 100 MG-150 MG (DAY)/150 MG (NIGHT) TABLETS: 28 days supply | Qty: 56 | Fill #8 | Status: AC

## 2019-07-17 MED FILL — SYMDEKO 100 MG-150 MG (DAY)/150 MG (NIGHT) TABLETS: 28 days supply | Qty: 56 | Fill #8

## 2019-07-19 DIAGNOSIS — J3089 Other allergic rhinitis: Principal | ICD-10-CM

## 2019-07-20 MED ORDER — FLUTICASONE PROPIONATE 50 MCG/ACTUATION NASAL SPRAY,SUSPENSION
11 refills | 0 days | Status: CP
Start: 2019-07-20 — End: 2020-07-19

## 2019-08-17 NOTE — Unmapped (Signed)
The [facility] has made a third and final attempt to reach this patient to refill the following medication: Symdeko 100-150mg .      We have left voicemails on the following phone numbers: [phone] (H).    Dates contacted: 12/22, 12/28 & 08/17/2019    Last scheduled delivery: 07/15/2019    The patient may be at risk of non-compliance with this medication. The patient should call the [facility] at [phone] (option 4) to refill medication.    Haytham Maher Leodis Binet   [facility] Shared [facility] Specialty Technician

## 2019-08-19 NOTE — Unmapped (Signed)
[facility]  Adult Cystic Fibrosis Clinic      [place] to introduce self and role and to provide information on the upcoming CF food drive. Left a VM and also sent a MyChart message with food drive details and this SW's contact information. Will await a response.

## 2019-08-24 NOTE — Unmapped (Signed)
Adult Cystic Fibrosis Patient Outreach Note     Purpose: [facility] Specialty Pharmacy Unable to Reach Patient/Overdue Refills  ? Received notification from [facility] on 08/19/19 that patient is unreachable after 3 call attempts for refill coordination.    August 24, 2019 1:41 PM   Sent MyChart message for Christian Young to call [facility] regarding scheduling next delivery of CF Medications. Medications were last sent to patient on 07/17/19.    Provided patient with [facility] phone contact [phone] option 4 and request to speak with Pulmonary Specialty Team [facility] Datt, pharmacist or Joylene Draft, technician)    Alton Revere, PharmD, BCPS, CPP  Clinical Pharmacist Practitioner  [facility] Adult Cystic Fibrosis Clinic

## 2019-08-24 NOTE — Unmapped (Signed)
[facility]  Adult Cystic Fibrosis Clinic      Attempted to contact Christian Young to inform him he was selected to participate in the CF grocery assistance program. This SW left a VM message requesting a return call by January 20th to confirm his acceptance and his mailing address.

## 2019-08-25 NOTE — Unmapped (Signed)
08/25/19    Spoke with Christian Young. Food insecurity endorsed and is agreeable to receiving grocery assistance via CF [employer].     Verified mailing address and phone number in EPIC are both accurate.    Christian Young provided verbal agreement to the following:  ?? To receive grocery assistance monthly via 50$ gift card.  ?? Duration of Assistance: 6 months (starting in February 2021).    Method of gift card delivery:  1. [name] Postal Service - Mailing address to be verified monthly prior to gift card being mailed.  OR  2. Provide in person at [facility] clinic appointment or during hospital admission.
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
## 2019-09-01 NOTE — Unmapped (Signed)
Adult Cystic Fibrosis Patient Outreach Note     Purpose: [facility] Unable to Reach Patient/Overdue Refills  ? Received notification from [facility] on 08/19/19 that patient is unreachable after 3 call attempts for refill coordination.    September 01, 2019 10:14 AM   Left voicemail for Christian Young to call [facility] regarding scheduling next delivery of CF Medications. Medications were last sent to patient on 07/17/19.    Provided patient with [facility] phone contact [phone] option 4 and request to speak with Pulmonary Specialty Team [facility] Datt, pharmacist or Joylene Draft, technician)    Alton Revere, PharmD, BCPS, CPP  Clinical Pharmacist Practitioner  [facility] Adult Cystic Fibrosis [facility]

## 2019-09-11 NOTE — Unmapped (Signed)
Adult Cystic Fibrosis Patient Outreach Note     Purpose: [facility] Unable to Reach Patient/Overdue Refills  ? Received notification from [facility] on 08/19/19 that patient is unreachable after 3 call attempts for refill coordination.    September 11, 2019 1:27 PM   ?? Left voicemail and Sent MyChart message for Demetri Goshert to call [facility] regarding scheduling next delivery of CF Medications. Medications were last sent to patient on 07/17/2019.  ?? I did mention that if MyChart is easier to communicate to schedule the refill, that he would need to answer specific questions from [facility], but that may be an option.    Provided patient with [facility] phone contact [phone] option 4 and request to speak with Pulmonary Specialty Team [facility] Datt, pharmacist or Joylene Draft, technician)    Alton Revere, PharmD, BCPS, CPP  Clinical Pharmacist Practitioner  [facility] Adult Cystic Fibrosis [facility]

## 2019-09-16 NOTE — Unmapped (Signed)
[facility]  Adult Cystic Fibrosis Clinic    As part of [facility] CF Grocery Assistance Program, Admin Coordinator mailed patient [phone] gift card for food due to financial difficulties and associated food insecurity on 09/16/2019.

## 2019-09-17 NOTE — Unmapped (Addendum)
Thursday: 09/17/19  Christian Young left a message with the [facility] nurse coordinator line. He notes feeling quite under the weather. Reports a lot nasal congestion and cough. His mother has been sick and despite that asking for her to stay in her room, he is concerned about getting something from her.     He notes he is off to work, but to please leave a message and he would return our call.       Friday: 09/18/19  Christian Young left a message with the CF nurse coordinator office. Noting he received the voicemail from the writer yesterday, but was slammed with work. He noted he will be available for a call today and that he is very much interested in starting some oral antibiotics.     Date of symptom onset: couple of days ago, Monday or Tuesday with increased cough, decease in endurance, sinus pressure, and postnasal drainage.     Denies concerns of COVID. Wearing a face while at work.     General Symptoms:  Fever:     no   Chills:     yes, feeling cold and waking up with a sweat   Nightsweats:    yes   Change in appetite:   no   Change in Energy Level:  yes, a little less with energy     Pulmonary Symptoms:    Increased cough:   yes   Cough Mild or Significant:  significant   Coughing at Night?    no, just a steady cough throughout the day    Cough:     sometimes productive and sometime dry     Sputum Description     change in color, greener than normal    Hemoptysis     no   Chest pain    normal spots, around chest wall d/t coughing   Chest tightness   sometimes, uses inahater and it helps sometimes    Wheezing:     yes, notices it more once home and not at work    Short of breath with activity:     yes       Sinus Symptoms:  Sometimes he is able to blow out drainage, other times not.    Sinus Drainage:   yes   Sinus Pressure:   yes   Performing Nasal Irrigations:   yes (morning and night)    Using Nasal Steroid:   yes   Allergy Medications:   yes    Neb Treatments:   Albuterol:     four times a day   Requiring additional albuterol:  yes see above  Hypertonic Saline:    two times a day  Pulmozyme:     two times a day  Currently on inhaled antibiotics:   no         none       Inhaled Steroids     Symbicort    Airway Clearance:      Vest  Exercise     Preferred Pharmacy: [facility].       [name]. [name], RN  CF Nurse Coordinator   [phone]

## 2019-09-18 MED ORDER — LEVOFLOXACIN 750 MG TABLET
ORAL_TABLET | Freq: Every day | ORAL | 0 refills | 14 days | Status: CP
Start: 2019-09-18 — End: 2019-10-02

## 2019-09-18 NOTE — Unmapped (Signed)
Addended by: Cicero Duck on: 09/18/2019 11:07 AM     Modules accepted: Orders

## 2019-09-18 NOTE — Unmapped (Signed)
Durenda Hurt Rucpcih, PA-C advising levofloxacin 750mg  daily x 14 days. Requesting that Nicky hold azithromycin while on levofloxacin. Also wondering if an ENT referral is appropriate given frequency of sinus infections.     Levofloxacin sent in to patient's requested pharmacy.     MyChart message sent to [facility] with the plan of care.    [name], RN  CF Nurse Coordinator   [phone]

## 2019-09-22 NOTE — Unmapped (Signed)
Patient referred for: COVID - [facility]    Outcome: Left voicemail. Advised to call back.    Scheduling Notes: Please schedule this CF patient for a COVID test.

## 2019-09-22 NOTE — Unmapped (Signed)
Thanks for calling him 

## 2019-09-22 NOTE — Unmapped (Signed)
Spoke to [place] by phone. He reports that since Saturday night after work his symptoms went into fast gear. On Sunday, he had a fever of 101 and it hasn't gone below 100 since then despite taking Tylenol. He has body aches, increased cough and shortness of breath, sore throat/runny nose, and decreased appetite. His Mom has been sick lately. His Dad works at [employer] and a couple people tested positive recently that he was around. His Dad has also been feeling ill. His wife is an EMT, and she started feeling sick last night and threw up. His Mom had a negative COVID test through [facility]. His Dad hasn't been tested. Christian Young and his wife [facility] and were told the results would come back in about 72 hours.     We prescribed PO Levaquin last week for a CF pulmonary exacerbation. He started that this afternoon. He has a pulse ox at home but hasn't checked it yet. His symptoms sound like COVID-19. In the case it is, advised supportive care with fluids and Tylenol as needed. Advised to monitor pulse ox. Discussed that he wouldn't need any additional COVID directed therapies unless his O2 sat were low, and he needed hospitalization.     Advised patient to seek immediate medical attention for severe chest pain or shortness of breath, O2 sats < 93%, inability to keep down fluids, lightheadedness or passing out.     Patient understood the Young. Advised him to keep [name] updated. Discussed possibility of also getting a rapid COVID test somewhere. He will consider this.     Routed note to Christian Hoop RN and Christian Plan MD.     Durenda Hurt [place], [place]  [facility] Adult Cystic Fibrosis [facility]   [phone]

## 2019-09-23 NOTE — Unmapped (Signed)
Thanks

## 2019-09-23 NOTE — Unmapped (Signed)
Spoke to [place] after he sent me an update on MyChart today about his condition. He continues to have fevers around 100. He is waiting for COVID test results. He reports keeping up with fluids. Asked him to check his pulse ox and he reported it was 91%.     Advised patient to seek medical attention in the ER if his O2 sat is consistently < 92%. He expressed understanding.      Durenda Hurt [place], PA-C  [facility] Adult Cystic Fibrosis [facility]   [phone]

## 2019-09-23 NOTE — Unmapped (Signed)
Thanks for the update

## 2019-09-23 NOTE — Unmapped (Addendum)
Rec'd vm message fr pt that today he is feeling about the same and his COVID test came back negative.  He is wanting to know your thoughts re best course of action re continuing on oral abx/prolonging the decision on whether he needs IV abx.  Please advise.  Thanks.

## 2019-09-29 ENCOUNTER — Ambulatory Visit
Admit: 2019-09-29 | Discharge: 2019-10-13 | Disposition: A | Discharge status: Home / Self Care | Payer: PRIVATE HEALTH INSURANCE

## 2019-09-29 LAB — BLOOD GAS, VENOUS
BASE EXCESS VENOUS: 4.9 — ABNORMAL HIGH (ref -2.0–2.0)
PH VENOUS: 7.42 (ref 7.32–7.43)
PO2 VENOUS: 35 mmHg (ref 30–55)

## 2019-09-29 LAB — CBC W/ AUTO DIFF
BASOPHILS ABSOLUTE COUNT: 0.2 10*9/L — ABNORMAL HIGH (ref 0.0–0.1)
BASOPHILS RELATIVE PERCENT: 1.9 %
EOSINOPHILS ABSOLUTE COUNT: 0.1 10*9/L (ref 0.0–0.7)
EOSINOPHILS RELATIVE PERCENT: 1.1 %
HEMATOCRIT: 38.5 % (ref 38.0–50.0)
HEMOGLOBIN: 13 g/dL — ABNORMAL LOW (ref 13.5–17.5)
LYMPHOCYTES ABSOLUTE COUNT: 1.5 10*9/L (ref 0.7–4.0)
LYMPHOCYTES RELATIVE PERCENT: 16.4 %
MEAN CORPUSCULAR HEMOGLOBIN CONC: 33.6 g/dL (ref 30.0–36.0)
MEAN CORPUSCULAR HEMOGLOBIN: 27.3 pg (ref 26.0–34.0)
MEAN CORPUSCULAR VOLUME: 81.2 fL (ref 81.0–95.0)
MONOCYTES RELATIVE PERCENT: 5.9 %
NEUTROPHILS ABSOLUTE COUNT: 6.9 10*9/L (ref 1.7–7.7)
NEUTROPHILS RELATIVE PERCENT: 74.7 %
PLATELET COUNT: 182 10*9/L (ref 150–450)
RED BLOOD CELL COUNT: 4.74 10*12/L (ref 4.32–5.72)
WBC ADJUSTED: 9.2 10*9/L (ref 3.5–10.5)

## 2019-09-29 LAB — COMPREHENSIVE METABOLIC PANEL
ALBUMIN: 4.2 g/dL (ref 3.5–5.0)
ALT (SGPT): 42 U/L (ref <50)
BUN / CREAT RATIO: 21
CALCIUM: 8.4 mg/dL — ABNORMAL LOW (ref 8.5–10.2)
CHLORIDE: 93 mmol/L — ABNORMAL LOW (ref 98–107)
CO2: 23 mmol/L (ref 22.0–30.0)
CREATININE: 0.77 mg/dL (ref 0.70–1.30)
EGFR CKD-EPI AA MALE: >90 mL/min/{1.73_m2} (ref >=60)
EGFR CKD-EPI NON-AA MALE: >90 mL/min/{1.73_m2} (ref >=60)
GLUCOSE RANDOM: 514 mg/dL (ref 70–179)
PROTEIN TOTAL: 7.5 g/dL (ref 6.5–8.3)
SODIUM: 132 mmol/L — ABNORMAL LOW (ref 135–145)

## 2019-09-29 LAB — SMEAR REVIEW

## 2019-09-29 LAB — LIPASE: Triacylglycerol lipase:CCnc:Pt:Ser/Plas:Qn:: 14 — ABNORMAL LOW

## 2019-09-29 LAB — MAGNESIUM: Magnesium:MCnc:Pt:Ser/Plas:Qn:: 1.5 — ABNORMAL LOW

## 2019-09-29 LAB — LYMPHOCYTES RELATIVE PERCENT: Lymphocytes/100 leukocytes:NFr:Pt:Bld:Qn:Automated count: 16.4

## 2019-09-29 LAB — BETA-HYDROXYBUTYRATE: Lab: 0.09

## 2019-09-29 LAB — CALCIUM: Calcium:MCnc:Pt:Ser/Plas:Qn:: 8.4 — ABNORMAL LOW

## 2019-09-29 LAB — BILIRUBIN TOTAL: Bilirubin:MCnc:Pt:Ser/Plas:Qn:: 0.5

## 2019-09-29 LAB — LACTATE BLOOD VENOUS: Lactate:SCnc:Pt:BldV:Qn:: 3.5 — ABNORMAL HIGH

## 2019-09-29 LAB — SPECIMEN SOURCE

## 2019-09-29 LAB — POTASSIUM: Potassium:SCnc:Pt:Ser/Plas:Qn:: 4.5

## 2019-09-29 NOTE — Unmapped (Signed)
Care Management  Initial Transition Planning Assessment    CM initial assessment completed telephonically as a precautionary measure in accordance with [facility] COVID-19 Pandemic emergency response plan. Patient verbalized understanding and agreement with completing this assessment by telephone.               General  Care Manager assessed the patient by : Telephone conversation with patient, Medical record review  Orientation Level: Oriented X4  Functional level prior to admission: Independent  Reason for referral: Discharge Planning   Type of Residence: Mailing Address:  [address]  [address]  [place] [address]  Contacts: Accompanied by: Alone   Extended Emergency Contact Information  Primary Emergency Contact: Griffin,Alyson   [place]  Mobile Phone: [phone]  Relation: Spouse  Preferred language: ENGLISH  Interpreter needed? No  Patient Phone Number: [phone] (home)         Medical Provider(s): [facility]  Reason for Admission: Admitting Diagnosis:  Cough [R05]  Hyperglycemia [R73.9]  Cystic fibrosis, unspecified (CMS-HCC) [E84.9]  Dyspnea, unspecified type [R06.00]  Past Medical History:   has a past medical history of Anxiety, Chronic pain disorder, Cystic fibrosis (CMS-HCC), Depression, Hypertension, and Nonproductive cough (04/05/2018).  Past Surgical History:   has a past surgical history that includes pr removal of lung,lobectomy (Right, 03/29/2017) and IR Insert Port Age Greater Than 5 Years (03/27/2019).   Previous admit date: 03/24/2019    Primary Insurance- Payor: Medical sales representative / Plan: CIG[employer] CT GEN CHOICE FUND OA PLUS / Product Type: *No Product type* /   Secondary Insurance ??? Secondary Insurance  CIGNA  Prescription Coverage ??? same as above  Preferred Pharmacy - [facility] [place] - [address]  [facility]  [facility] [place] [place] - [address]    Transportation home: Private vehicle Contact/Decision Maker  Extended Emergency Contact Information  Primary Emergency Contact: Griffin,Alyson   [place] Phone: [phone]  Relation: Spouse  Preferred language: ENGLISH  Interpreter needed? No    Legal Next of Kin / Guardian / POA / Advance Directives       Advance Directive (Medical Treatment)  Does patient have an advance directive covering medical treatment?: Patient does not have advance directive covering medical treatment.  Advance directive covering medical treatment not in Chart:: Copy requested from family  Reason patient does not have an advance directive covering medical treatment:: Patient needs follow-up to complete one.    Health Care Decision Maker [HCDM] (Medical & Mental Health Treatment)  Healthcare Decision Maker: HCDM documented in the HCDM/Contact Info section.  Information offered on HCDM, Medical & Mental Health advance directives:: Patient given information.    Patient Information  Lives with: Spouse/significant other, Parent    Type of Residence: Private residence     Location/Detail: [place], [place]    Support Systems/Concerns: Spouse, Parent    Responsibilities/Dependents at home?: No    Home Care services in place prior to admission?: Yes  Type of Home Care services in place prior to admission: Home nursing visits  Current Home Care provider (Name/Phone #): [facility]    Outpatient/Community Resources in place prior to admission: Clinic  Agency detail (Name/Phone #): [facility]    Equipment Currently Used at Home: respiratory supplies  Current HME Agency (Name/Phone #): Nebulizer and chest vest therapy    Currently receiving outpatient dialysis?: No    Financial Information  Need for financial assistance?: No    Social Determinants of Health  Social History     Socioeconomic History   ??? Marital status: Married     Spouse name: None   ??? Number of children: None   ??? Years of education: None   ??? Highest education level: None   Occupational History   ??? None   Social Needs   ??? Financial resource strain: Not hard at all   ??? Food insecurity     Worry: Never true     Inability: Never true   ??? Transportation needs     Medical: No     Non-medical: No   Tobacco Use   ??? Smoking status: Never Smoker   ??? Smokeless tobacco: Never Used   Substance and Sexual Activity   ??? Alcohol use: Yes     Alcohol/week: 3.0 standard drinks     Types: 3 Glasses of wine per week     Comment: 3 bottles of wine 2 days ago   ??? Drug use: No   ??? Sexual activity: Not Currently   Lifestyle   ??? Physical activity     Days per week: 0 days     Minutes per session: 0 min   ??? Stress: Only a little   Relationships   ??? Social Wellsite geologist on phone: Patient refused     Gets together: Patient refused     Attends religious service: Patient refused     Active member of club or organization: Patient refused     Attends meetings of clubs or organizations: Patient refused     Relationship status: Patient refused   Other Topics Concern   ??? None   Social History Narrative   ??? None     Housing/Utilities   ??? Within the past 12 months, have you ever stayed: outside, in a car, in a tent, in an overnight shelter, or temporarily in someone else's home (i.e. couch-surfing)? No    ??? Are you worried about losing your housing? No    ??? Within the past 12 months, have you been unable to get utilities (heat, electricity) when it was really needed? No      Literacy   ??? How often do you need to have someone help you when you read instructions, pamphlets, or other written material from your doctor or pharmacy? Never      Discharge Needs Assessment  Concerns to be Addressed: no discharge needs identified, denies needs/concerns at this time    Clinical Risk Factors: Multiple Diagnoses (Chronic)    Barriers to taking medications: No    Prior overnight hospital stay or ED visit in last 90 days: No    Readmission Within the Last 30 Days: no previous admission in last 30 days         Anticipated Changes Related to Illness: inability to work    Equipment Needed After Discharge: none    Discharge Facility/Level of Care Needs: other (see comments)(Home with self-care)    Readmission  Risk of Unplanned Readmission Score:  %  Predictive Model Details           24% (High) Factors Contributing to Score   Calculated 09/29/2019 15:01 35% Number of active Rx orders is 60   [place] Risk of Unplanned Readmission Model 12% Number of hospitalizations in last year is 3     9% Number of ED visits in last six months is 2     8%  ECG/EKG order is present in last 6 months     8% Latest calcium is low (8.4 mg/dL)     7% Encounter of ten days or longer in last year is present     6% Imaging order is present in last 6 months     5% Latest hemoglobin is low (13.0 g/dL)     4% Active anticoagulant Rx order is present     2% Age is 33     2% Charlson Comorbidity Index is 2     2% Future appointment is scheduled     1% Active ulcer medication Rx order is present     0% Current length of stay is 0.1 days     Readmitted Within the Last 30 Days? (No if blank)   Patient at risk for readmission?: Yes    Discharge Plan  Screen findings are: Care Manager reviewed the plan of the patient's care with the Multidisciplinary Team. No discharge planning needs identified at this time. Care Manager will continue to manage plan and monitor patient's progress with the team.    Expected Discharge Date: 10/05/2019    Expected Transfer from Critical Care: (N/A)    Quality data for continuing care services shared with patient and/or representative?: N/A  Patient and/or family were provided with choice of facilities / services that are available and appropriate to meet post hospital care needs?: N/A    Initial Assessment complete?: Yes

## 2019-09-29 NOTE — Unmapped (Signed)
Pt with hx of CF, pt reports woke up feeling bad. Pt with increase cough, SOB with walking, headache and dizziness x 3 days.

## 2019-09-29 NOTE — Unmapped (Signed)
PULMONARY ADMISSION  NOTE      Patient: Christian Young(1986-02-08)  Length of stay:  LOS: 0 days     Assessment and Plan:      Active Problems:    * No active hospital problems. *  Resolved Problems:    * No resolved hospital problems. *    Christian Young is a 34 y.o. 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     1. CF exacerbation w/pulmonary manifestations w/acute exacerbation of bronchiectasis: Persistent shortness of breath and cough. Completed course of Levaquin outpatient but symptoms persisted. Last sputum culture on 03/24/2019 grew 3+ smooth pseudomonas S to tobramycin, I aztreonam and resistant to all else  - CF Sputum Culture   -??Will initiate??pip/tazo, and inhaled tobramycin per outpatient pulmonologist  -??Continue??home??Symdeko using patient's home supply  - Pulmozyme 2.5 mg BID  - montelukast 10 mg qHS  - cetirizine 10 mg daily  - Symbicort 2 puff BID not on formulary, will replace with nebulized pulmicort and arformoterol??  - HTS 7% QID w/ RT  - Aerobika and chest vest QID w/ RT    2. History of Mycobacterium abscessus infection: Had RUL lobectomy in August of 2018. Previously on clofazimine and azithromycin. Last AFB culture/smear negative 03/25/2019  -Follow up AFB culture & smear    3. CF-related PI  -Continue home creon  -Continue home multivitamin    4. CF-related DM  Last Hemoglobin A1c 8.5 07/2018  -Continue home Lantus 70U (patient stated he took this this morning, so will redose tomorrow morning)  -lispro 30 units TID AC (what patient states he's been doing at home, may need to titrate up given poor glucose control at admission)    5. Depression, anxiety:  -Continue home fluoxetine 20mg  daily  -Continue home lamotrigine 200mg  daily  -Continue home amitriptyline 100mg  nightly    6. HTN:  -Continue home lisinopril 10mg  daily    7. History of DVT:  -Continue home xeralto 20mg  daily    8. RLS:  -Continue home pramipexole 0.25mg  daily    FEN/GI/PPX:  - Regular diet  - On xarelto for history of DVT    Please page [phone] with any questions.    [name], MD    Subjective:      History of Present Illness:  Christian Young is a 34 y.o. 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     Christian Young first noted respiratory symptoms of shortness of breath and cough about a week and a half ago. He also noted some low grade fevers. His shortness of breath primarily occurs with exertion, although he does note some shortness of breath at rest at times. He discussed his symptoms with his outpatient pulmonologist and was started on Levaquin while continuing his home Symbicort. He denies sick contacts and states his symptoms are similar to prior CF exacerbations he has had in the past. He denies blood in his sputum. He denies other symptoms such as lower extremity edema, abdominal pain, diarrhea or nausea/vomitting.       Review of Systems: A comprehensive review of systems was performed and was negative except as above in HPI  Past Medical History:   Diagnosis Date   ???  Anxiety    ??? Chronic pain disorder    ??? Cystic fibrosis (CMS-HCC)    ??? Depression    ??? Hypertension    ??? Nonproductive cough 04/05/2018     Past Surgical History:   Procedure Laterality Date   ??? IR INSERT PORT AGE GREATER THAN 5 YRS  03/27/2019    IR INSERT PORT AGE GREATER THAN 5 YRS 03/27/2019 Rush Barer, MD IMG VIR [facility]   ??? PR REMOVAL OF LUNG,LOBECTOMY Right 03/29/2017    Procedure: REMOVAL OF LUNG, OTHER THAN PNEUMONECTOMY; SINGLE LOBE (LOBECTOMY);  Surgeon: Cherie Dark, MD;  Location: MAIN [facility];  Service: Thoracic     Medications reviewed in Epic  Allergies as of 09/29/2019 - Reviewed 09/29/2019   Allergen Reaction Noted   ??? [place] [aztreonam lysine] Anaphylaxis 12/27/2016   ??? Cefepime Itching and Nausea Only 09/06/2015   ??? Other Anaphylaxis and Other (See Comments) 09/06/2015   ??? Slo-bid 100 Anaphylaxis 06/20/2017   ??? Banana Itching and Nausea And Vomiting 07/29/2016   ??? Tobramycin Tinnitus 09/06/2015     Family History   Problem Relation Age of Onset   ??? Bipolar disorder Mother    ??? Depression Mother      Social History     Tobacco Use   ??? Smoking status: Never Smoker   ??? Smokeless tobacco: Never Used   Substance Use Topics   ??? Alcohol use: Yes     Alcohol/week: 3.0 standard drinks     Types: 3 Glasses of wine per week     Comment: 3 bottles of wine 2 days ago        Objective:      Physical Exam:  Vitals:    09/29/19 0928   BP: 148/97   Resp: 24   Temp: 37 ??C   SpO2: 95%     General: Alert, well-appearing, and in no distress.  Eyes: Anicteric sclera, conjunctiva clear.  ENT:  Mucous membranes moist and intact.  Lymph: No cervical or supraclavicular adenopathy.  Lungs: Normal excursion, no dullness to percussion. Good air movement bilaterally, without wheezes or crackles. Normal upper airway sounds without evidence of stridor.  Cardiovascular: Regular rate and rhythm, S1, S2 normal, no murmur, click, rub or gallop appreciated.  Abdomen: Soft, non-tender, not distended, bowel sounds are normal, liver is not enlarged, spleen is not enlarged  Musculoskeletal: No clubbing and no synovitis.  Skin: No rashes or lesions.  Neuro: No focal neurological deficits.       Diagnostic Review:   All labs and images were personally reviewed.

## 2019-09-29 NOTE — Unmapped (Signed)
[facility]  Emergency Department Attestation Note  Emergency Department Provider Note       ED Clinical Impression     Final diagnoses:   Cystic fibrosis, unspecified (CMS-HCC) (Primary)   Cough   Dyspnea, unspecified type   Hyperglycemia        Impression, ED Course, Assessment and Plan     Impression:  34 y.o. male with a PMH of CF c/b DM, bronchiectasis, chronic DVT (on Xarelto), and HTN presenting with a cough. Patient spoke with his pulmonologist extensively last week regarding body aches, increased cough and shortness of breath, sore throat, rhinorrhea, and decreased appetite.  Was started on Levaquin on the 10th, symptoms worsened today.  Told to come into the hospital for IV antibiotics and admission.  Patient here is tachycardic, breathing quickly at about 25 times a minute, saturating 95%, has faint wheezing on the right side, otherwise clear breath sounds.  No signs of DVT.  Patient is compliant with Xarelto.  Suspect likely CF flare versus pneumonia versus viral process.  Patient was Covid positive, will reswab today.  Will obtain chest x-ray.  Lab work, give fluids.  Will transition to IV Levaquin, and give patient admitted to hospital given tachypnea and symptoms.  Given patient is compliant with his feel less inclined to pursue pulmonary embolism, will defer to inpatient team.    ED Course as of Sep 28 1122   Tue Sep 29, 2019   1015 ECG with sinus tachycardia 520 bpm      1123 Patient very hyperglycemic, small anion gap.  No acidosis on CMP.  We will add a venous blood gas, potassium hemolyzed so we will not give insulin at this time.  Paging for admission.          ____________________________________________    The case was discussed with Dr. Brain Hilts* who is in agreement with the above assessment and plan    Dictation software was used while making this note. Please excuse any errors made with dictation software.    Additional Medical Decision Making     I have reviewed the vital signs and the nursing notes. Labs and radiology results that were available during my care of the patient were independently reviewed by me and considered in my medical decision making.     I independently visualized the EKG tracing if performed  I independently visualized the radiology images if performed  I reviewed the patient's prior medical records if available.  Additional history obtained from family if available  I discussed the case with the admitting provider and the consulting services if the patient was admitted and/or consulting services were utilized.     History     Chief Complaint  Chief Complaint   Patient presents with   ??? Cough       HPI   Christian Young is a 34 y.o. male with a PMH of CF c/b DM, bronchiectasis, chronic DVT (on Xarelto), and HTN presenting with a cough. Per chart review, patient spoke with his pulmonologist extensively last week regarding body aches, increased cough and shortness of breath, sore throat, rhinorrhea, and decreased appetite. Stated at that time that his father had known COVID contacts. Patient tested COVID negative on 2/10 but was started on oral Levaquin, which provided 1-2 days of relief. However, he spoke with pulmonologist this morning endorsing feeling congested and unwell and both felt it was best to come to [facility] for IV Abx.  His last hospitalization was  in August.    Patient reports that his symptoms began on 2/6 but improved for a couple days after starting Levaquin but states that this morning his cough had returned along with worsening shortness of breath and nasal congestion. Also endorses decreased appetite and right, inferior chest pain at this time, which he says happens when he has a flare, due to his lobectomy, says morphine helps.. Denies any other symptom changes since his negative COVID test. Reports using his albuterol inhalers which has provided relief, last dose this morning. Denies any chronic Abx use, does use tobramycin every other month.. Denies any leg swelling.  He is not having worsening abdominal symptoms such as vomiting or diarrhea.  Denies any fevers or chills overtly.      ROS: 12 point ROS otherwise negative except as documented.    Past Medical History:   Diagnosis Date   ??? Anxiety    ??? Chronic pain disorder    ??? Cystic fibrosis (CMS-HCC)    ??? Depression    ??? Hypertension    ??? Nonproductive cough 04/05/2018       Past Surgical History:   Procedure Laterality Date   ??? IR INSERT PORT AGE GREATER THAN 5 YRS  03/27/2019    IR INSERT PORT AGE GREATER THAN 5 YRS 03/27/2019 Rush Barer, MD IMG VIR [facility]   ??? PR REMOVAL OF LUNG,LOBECTOMY Right 03/29/2017    Procedure: REMOVAL OF LUNG, OTHER THAN PNEUMONECTOMY; SINGLE LOBE (LOBECTOMY);  Surgeon: Cherie Dark, MD;  Location: MAIN OR [facility];  Service: Thoracic         Current Facility-Administered Medications:   ???  levofloxacin (LEVAQUIN) 750 mg/150 mL IVPB 750 mg, 750 mg, Intravenous, Once, Eldridge Abrahams, MD, Last Rate: 100 mL/hr at 09/29/19 1025, 750 mg at 09/29/19 1025  ???  sodium chloride 0.9% (NS) bolus 1,000 mL, 1,000 mL, Intravenous, Once, Eldridge Abrahams, MD, Stopped at 09/29/19 1025    Current Outpatient Medications:   ???  albuterol (PROVENTIL HFA;VENTOLIN HFA) 90 mcg/actuation inhaler, Inhale 2 puffs every six (6) hours as needed., Disp: 1 Inhaler, Rfl: 1  ???  albuterol 2.5 mg/0.5 mL nebulizer solution, Inhale 0.5 mL (2.5 mg total) by nebulization every six (6) hours as needed for wheezing. (Patient taking differently: Inhale 2.5 mg by nebulization Two (2) times a day. ), Disp: 90 each, Rfl: 3  ???  amitriptyline (ELAVIL) 100 MG tablet, Take 100 mg by mouth nightly. , Disp: , Rfl:   ???  BETHKIS 300 mg/4 mL Nebu, Inhale 1 vial via nebulization every 12 hours for 28 days, alternating every other 28 days, Disp: 224 mL, Rfl: 6  ???  blood-glucose meter kit, Dispense meter that is preferred by [employer], Disp: 1 each, Rfl: 0  ???  blood-glucose meter,continuous (DEXCOM G6 RECEIVER) Misc, Use as directed, Disp: 1 each, Rfl: 0  ???  blood-glucose sensor (DEXCOM G6 SENSOR) Devi, Use sq as directed every 14 days, Disp: 6 Device, Rfl: 11  ???  blood-glucose transmitter (DEXCOM G6 TRANSMITTER) Devi, Use as directed, Disp: 1 Device, Rfl: 11  ???  budesonide-formoteroL (SYMBICORT) 160-4.5 mcg/actuation inhaler, Inhale 2 puffs Two (2) times a day., Disp: 3 Inhaler, Rfl: 3  ???  cetirizine (ZYRTEC) 10 MG tablet, TAKE 1 TABLET (10 MG TOTAL) BY MOUTH DAILY., Disp: 90 tablet, Rfl: 3  ???  CREON 36,000-114,000- 180,000 unit CpDR, TAKE 7 TO 8 CAPSULES WITH MEALS AND 5 TO 6 CAPSULES WITH SNACKS, Disp: 1200 capsule, Rfl: 11  ???  dornase alfa (PULMOZYME) 1 mg/mL nebulizer solution, Inhale 2.5 mg Two (2) times a day., Disp: 450 mL, Rfl: 3  ???  EASY TOUCH LANCING DEVICE Misc, Use as directed., Disp: , Rfl:   ???  EASY TOUCH TWIST LANCETS 30 gauge Misc, Use as directed., Disp: , Rfl:   ???  fexofenadine (ALLEGRA) 180 MG tablet, Take 1 tablet (180 mg total) by mouth daily., Disp: 30 tablet, Rfl: 0  ???  FLUoxetine (PROZAC) 20 MG capsule, Take 40 mg by mouth daily., Disp: , Rfl:   ???  fluticasone propionate (FLONASE) 50 mcg/actuation nasal spray, USE 1 SPRAY IN EACH NOSTRIL EVERY DAY, Disp: 16 g, Rfl: 11  ???  gabapentin (NEURONTIN) 600 MG tablet, Take 600 mg by mouth Three (3) times a day as needed., Disp: , Rfl:   ???  insulin ASPART (NOVOLOG FLEXPEN) 100 unit/mL (3 mL) injection pen, Use up to 150 units/day, divided TID AC meals, Disp: 135 mL, Rfl: 12  ???  insulin glargine (BASAGLAR, LANTUS) 100 unit/mL (3 mL) injection pen, Inject 0.7 mL (70 Units total) under the skin daily., Disp: 30 mL, Rfl: 0  ???  lamoTRIgine (LAMICTAL) 200 MG tablet, Take 200 mg by mouth daily., Disp: , Rfl:   ???  levoFLOXacin (LEVAQUIN) 750 MG tablet, Take 1 tablet (750 mg total) by mouth daily for 14 days., Disp: 14 tablet, Rfl: 0  ???  lisinopriL (PRINIVIL,ZESTRIL) 10 MG tablet, Take 1 tablet (10 mg total) by mouth daily., Disp: 30 tablet, Rfl: 0  ???  melatonin 10 mg Tab, Take 10 mg by mouth nightly., Disp: , Rfl:   ???  montelukast (SINGULAIR) 10 mg tablet, Take 1 tablet (10 mg total) by mouth nightly., Disp: 90 tablet, Rfl: 3  ???  MVW COMPLETE FORMUL PROBIOTIC 40 billion cell -15 mg CpDR, Take 1 capsule by mouth daily. (Patient taking differently: Take 1 capsule by mouth Two (2) times a day. ), Disp: 90 capsule, Rfl: 3  ???  MVW Complete, pediatric multivit 61-D3-vit K, 1,500-800 unit-mcg cap, Take 2 capsules by mouth daily. (Patient taking differently: Take 1 capsule by mouth Two (2) times a day. ), Disp: 60 capsule, Rfl: 11  ???  nebulizers Misc, Use as directed with inhaled medications, Disp: 4 each, Rfl: 3  ???  omeprazole 20 mg tablet, Take 1 tablet (20 mg total) by mouth daily., Disp: 90 tablet, Rfl: 3  ???  pen needle, diabetic (BD ULTRA-FINE NANO PEN NEEDLE) 32 gauge x 5/32 (4 mm) Ndle, use up to 4 times daily, Disp: 400 each, Rfl: 12  ???  pramipexole (MIRAPEX) 0.125 MG tablet, Take 0.25 mg by mouth daily. , Disp: , Rfl:   ???  sod bicarb-sod chlor-neti pot pkdv, 1 each by sinus irrigation route two (2) times a day., Disp: 50 each, Rfl: 11  ???  tezacaftor 100mg /ivacaftor 150mg  and ivacaftor 150mg  (SYMDEKO) tablets, TAKE BY MOUTH AS DIRECTED ON PACKAGE LABELING, Disp: 56 each, Rfl: 11  ???  traZODone (DESYREL) 100 MG tablet, Take 2 tablets (200 mg total) by mouth nightly., Disp: 60 tablet, Rfl: 0  ???  XARELTO 20 mg tablet, Take 20 mg by mouth daily., Disp: , Rfl:     Allergies  Cayston [aztreonam lysine], Cefepime, Other, Slo-bid 100, Banana, and Tobramycin    Family History   Problem Relation Age of Onset   ??? Bipolar disorder Mother    ??? Depression Mother        Social History  Social History  Tobacco Use   ??? Smoking status: Never Smoker   ??? Smokeless tobacco: Never Used   Substance Use Topics   ??? Alcohol use: Yes     Alcohol/week: 3.0 standard drinks     Types: 3 Glasses of wine per week     Comment: 3 bottles of wine 2 days ago   ??? Drug use: No        Physical Exam     VITAL SIGNS:      Vitals:    09/29/19 0928   BP: 148/97   Resp: 24   Temp: 37 ??C (98.6 ??F)   SpO2: 95%       Constitutional: Alert and oriented. Well appearing and in no distress.  Eyes: Conjunctivae are normal.  HEENT: Normocephalic and atraumatic.   Cardiovascular: Tachycardic rate, regular rhythm. Normal and symmetric distal pulses. Brisk capillary refill. Normal skin turgor.  Respiratory: Slightly increased and tachypneic respiratory effort.  Diminished right lung base, faint wheezes are noted on the right side, otherwise clear with good breath sounds bilaterally  Gastrointestinal: Soft, non-tender, non-distended.  Musculoskeletal: Nontender with normal range of motion in all extremities.       Right lower leg: No tenderness or edema.       Left lower leg: No tenderness or edema.  Neurologic: Normal speech and language. No gross focal neurologic deficits are appreciated. Patient is moving all extremities equally, face is symmetric at rest and with speech.  Skin: Skin is warm, dry and intact. No rash noted.  Psychiatric: Mood and affect are normal. Speech and behavior are normal.     Radiology     XR Chest Portable    (Results Pending)        Laboratory Data     Lab Results   Component Value Date    WBC 9.2 09/29/2019    HGB 13.0 (L) 09/29/2019    HCT 38.5 09/29/2019    PLT 182 09/29/2019       Lab Results   Component Value Date    NA 132 (L) 09/29/2019    K  09/29/2019      Comment:      Hemolyzed Specimen    CL 93 (L) 09/29/2019    CO2 23.0 09/29/2019    BUN 16 09/29/2019    CREATININE 0.77 09/29/2019    GLU 514 (HH) 09/29/2019    CALCIUM 8.4 (L) 09/29/2019    MG 1.5 (L) 09/29/2019    PHOS 3.9 08/12/2018       Lab Results   Component Value Date    BILITOT  09/29/2019      Comment:      Hemolyzed Specimen    BILIDIR <0.10 08/15/2018    PROT 7.5 09/29/2019    ALBUMIN 4.2 09/29/2019    ALT 42 09/29/2019    AST  09/29/2019      Comment:      Hemolyzed Specimen    ALKPHOS  09/29/2019 Comment:      Hemolyzed Specimen       Lab Results   Component Value Date    INR 1.61 01/29/2019       Pertinent labs & imaging results that were available during my care of the patient were reviewed by me and considered in my medical decision making (see chart for details).    Portions of this record have been created using Scientist, clinical (histocompatibility and immunogenetics). Dictation errors have been sought, but may not have been identified and corrected.    Documentation  assistance was provided by Sherle Poe, Scribe, on September 29, 2019 at 9:41 AM for Eldridge Abrahams, MD.       A scribe was used when documenting this visit. I agree with the above documentation. Signed by  Eldridge Abrahams on  September 29, 2019 at 10:01 AM      Eldridge Abrahams, MD  EM2     Eldridge Abrahams, MD  Resident  09/29/19 [phone]

## 2019-09-29 NOTE — Unmapped (Signed)
Had a message from Jeanclaude's wife, Alyson. Lewis felt okay for a day or two after starting the Levaquin, but has woken up congested and sick.  Both feel that it is time for IV antibiotics. Asking which hospital to head towards.    Spoke with [name]. Advised on them heading to [facility]. Will reach out to Dr Tresa Res who is currently on service there.    [name], RN  CF Nurse Coordinator   [phone]

## 2019-09-29 NOTE — Unmapped (Signed)
Adult Inpatient CF Initial Assessment        Christian Young is a 34 y.o. male who was admitted for Cough [R05]  Hyperglycemia [R73.9]  Cystic fibrosis, unspecified (CMS-HCC) [E84.9]  Dyspnea, unspecified type [R06.00].    Heart Rate: 100  Respiratory Rate: 17  Bilateral Breath Sounds: Diminished with Insp wheeze RUL post    Cough/Sputum: How Much? med                              Color:brown     Are they coughing up blood?                                                      No: X                                                     How Much?    Home medication & frequency :    Albuterol Neb 2.5mg  BID  Albuterol HFA 84mcg/2puffs PRN  Hypertonic Saline 7% BID  Pulmozyme 2.5mg  Qday  Tobi INH 300mg  BID EVERY OTHER MONTH, last took month of January  Advair 2p BID  Home airway clearance & frequency:    Vest BID    Times Chosen for Inhaled Medications and Airway Clearance:  9PM    Methods tried & found to be ineffective:  Vibra lung, Aerobika       Home O2? Yes:                       No: X  If yes, what is their home requirement:     Home non-invasive ventilation? Yes:                                                          No: X  If yes, enter type of device, settings, & mode:    Airway clearance technique method offered & decided upon: Continue with home chest vest    Comments:

## 2019-09-30 LAB — BASIC METABOLIC PANEL
BLOOD UREA NITROGEN: 14 mg/dL (ref 7–21)
BUN / CREAT RATIO: 18
CALCIUM: 8.4 mg/dL — ABNORMAL LOW (ref 8.5–10.2)
CHLORIDE: 97 mmol/L — ABNORMAL LOW (ref 98–107)
CO2: 28 mmol/L (ref 22.0–30.0)
CREATININE: 0.79 mg/dL (ref 0.70–1.30)
EGFR CKD-EPI AA MALE: >90 mL/min/{1.73_m2} (ref >=60)
EGFR CKD-EPI NON-AA MALE: >90 mL/min/{1.73_m2} (ref >=60)
GLUCOSE RANDOM: 156 mg/dL (ref 70–179)
POTASSIUM: 4.3 mmol/L (ref 3.5–5.0)
SODIUM: 137 mmol/L (ref 135–145)

## 2019-09-30 LAB — CBC
HEMOGLOBIN: 12.8 g/dL — ABNORMAL LOW (ref 13.5–17.5)
MEAN CORPUSCULAR HEMOGLOBIN CONC: 33.9 g/dL (ref 30.0–36.0)
MEAN CORPUSCULAR HEMOGLOBIN: 27.1 pg (ref 26.0–34.0)
MEAN CORPUSCULAR VOLUME: 79.9 fL — ABNORMAL LOW (ref 81.0–95.0)
MEAN PLATELET VOLUME: 7.9 fL (ref 7.0–10.0)
PLATELET COUNT: 268 10*9/L (ref 150–450)
RED BLOOD CELL COUNT: 4.73 10*12/L (ref 4.32–5.72)
RED CELL DISTRIBUTION WIDTH: 15 % (ref 12.0–15.0)
WBC ADJUSTED: 10.7 10*9/L — ABNORMAL HIGH (ref 3.5–10.5)

## 2019-09-30 LAB — HEMATOCRIT: Hematocrit:VFr:Pt:Bld:Qn:: 37.8 — ABNORMAL LOW

## 2019-09-30 LAB — HEMOGLOBIN A1C
HEMOGLOBIN A1C: 8.8 % — ABNORMAL HIGH (ref 4.8–5.6)
Hemoglobin A1c/Hemoglobin.total:MFr:Pt:Bld:Qn:: 8.8 — ABNORMAL HIGH

## 2019-09-30 LAB — POTASSIUM: Potassium:SCnc:Pt:Ser/Plas:Qn:: 4.3

## 2019-09-30 NOTE — Unmapped (Signed)
Patient alert and oriented and independent. Patient ate all of dinner and drinking fluids. Increased PO encouraged. Patient's lung sounds are diminished and cough is unproductive until after breathing treatments. Breathing remains shallow and tachypnic. VSS. BG WNL. C/o pain 7/10 relieved with 5mg  oxycodone given 1x per PRN order. No acute events overnight.     Problem: Adult Inpatient Plan of Care  Goal: Plan of Care Review  Outcome: Progressing  Goal: Patient-Specific Goal (Individualization)  Outcome: Progressing  Goal: Absence of Hospital-Acquired Illness or Injury  Outcome: Progressing  Goal: Optimal Comfort and Wellbeing  Outcome: Progressing  Goal: Readiness for Transition of Care  Outcome: Progressing  Goal: Rounds/Family Conference  Outcome: Progressing     Problem: Infection  Goal: Infection Symptom Resolution  Outcome: Progressing     Problem: Diabetes Comorbidity  Goal: Blood Glucose Level Within Desired Range  Outcome: Progressing     Problem: Hypertension Comorbidity  Goal: Blood Pressure in Desired Range  Outcome: Progressing     Problem: Wound  Goal: Optimal Wound Healing  Outcome: Progressing

## 2019-09-30 NOTE — Unmapped (Signed)
Cystic Fibrosis Nutrition Assessment    Inpatient: MD Consult this admission and related follow up  Primary Pulmonary Provider: [name]  ===================================================================  Christian Young is a 34 y.o. male admitted to [facility] on 2/16 for CF exacerbation  Past Medical History:   Diagnosis Date   ??? Anxiety    ??? Chronic pain disorder    ??? Cystic fibrosis (CMS-HCC)    ??? Depression    ??? Hypertension    ??? Nonproductive cough 04/05/2018     ===================================================================  INTERVENTION:    1. Change diet to High Calorie High Protein    2. Change enzyme regimen to Creon 24,000 x 11-12 caps/meal, 7-9 caps/snack (add snack as PRN dose)  - pt's home regimen is Creon 36,000 x 7-8 caps/meal, 5-6 caps/meal; Creon 36,000 is not available on inpatient formulary. Pt did not bring home supply.    3. Recommend check PT. If elevated start 5mg  phytonadione daily x 5 days, then recheck PT. If WNL start 5mg  phytonadione twice weekly while on IV antibiotics.    4. Change vitamin regimen to MVW D5000 gel cap twice daily  - equivalent to patient's home regimen    5. Please check fat-soluble vitamin labs and CRP prior to discharge    6. Weigh patient twice weekly this admit    7. Ordered CIB: No Sugar Added and Glucerna (chocolate) in computrition for pt to trial    8. Continue remainder of nutrition regimen:  - insulin regimen per endocrine    Recommendations communicated to team.    Inpatient:   Will follow up with patient per protocol: 1-2 times per week (and more frequent as indicated)  ===================================================================  ASSESSMENT:  Nutrition Category = Adult Class 1 Obesity: BMI 30 to < 35 kg/m2    Current diet is not appropriate for CF. Current PO intake is not adequate to meet estimated CF needs. Patient would benefit from PO supplement for additional calories. Patient meeting goals for CF weight management. Enzyme dose is not within established guidelines. Patient would benefit from adjustment in enzyme regimen. Vitamin adherence is not adequate to reach/maintain optimal fat soluble vitamin levels. Patient would benefit from change in vitamin regimen. Patient may benefit from vitamin K supplementation while on IV antibiotics. Insulin regimen is appropriate for carbohydrate intake. Patient on CFTR modulator is consuming adequate amounts of fat-containing foods with prescribed medication to optimize absorption.    ASPEN/AND Malnutrition Screening:  Unable to determine given somewhat unclear wt loss history. Pt at risk for malnutrition given reported prolonged inadequate PO intake.    Goals:  1. Meet estimated daily needs: 3864 kcal/day, 135-180 g PRO/day, 3360 mL fluid/day      Calories estimated using: Cystic Fibrosis Conference Formula, protein per DRI x 1.5-2, fluid per [place] Segar  2. Reach/maintain established goals for CF:                Adult - BMI 22 kg/m2 for CF females and 23 kg/m2 for CF males    Pediatric - BMI or wt/ht ratio > 50%ile for age  36. Normal fat-soluble vitamin levels: Vitamin A, Vitamin E and PT per lab range; Vitamin D 25OH total >30  4. Maintain glucose control. Carbohydrate content of diet should comprise 40-50% of total calorie needs, but carbohydrates are not restricted in this population.    5. Meet sodium needs for CF  ===================================================================  INPATIENT:  Christian Young is admitted with Cough [R05]  Hyperglycemia [R73.9]  Cystic fibrosis, unspecified (CMS-HCC) [id number]]  Dyspnea, unspecified type [R06.00].    Current Nutrition Orders (inpatient):       Nutrition Orders   (From admission, onward)             Start     Ordered    09/29/19 1334  Nutrition Therapy Regular/House; Meal Time Insulin  Effective now     Question Answer Comment   Nutrition Therapy: Regular/House    Special Services: Meal Time Insulin        09/29/19 1333              CF Nutrition related medications (inpatient): Nutritionally relevant medications reviewed.   MVW gel cap 2x daily  Insulin regimen  Creon 24,000 x 1 cap/meal  IV abx    CF Nutrition related labs (inpatient): reviewed  ==================================================================  CLINICAL DATA:  Past Medical History:   Diagnosis Date   ??? Anxiety    ??? Chronic pain disorder    ??? Cystic fibrosis (CMS-HCC)    ??? Depression    ??? Hypertension    ??? Nonproductive cough 04/05/2018     Anthroprometric Evaluation:  Weight changes: per EPIC wt hx, pt has experienced ~7.4% wt loss x 6 months  Facility age limit for growth percentiles is 20 years.  BMI Readings from Last 3 Encounters:   09/29/19 33.79 kg/m??   03/24/19 36.49 kg/m??   03/16/19 35.67 kg/m??     Wt Readings from Last 3 Encounters:   09/29/19 (!) 113 kg (249 lb 1.9 oz)   03/24/19 (!) 122 kg (269 lb 1.1 oz)   03/16/19 (!) 119.3 kg (263 lb)     Ht Readings from Last 3 Encounters:   09/29/19 182.9 cm (6')   03/24/19 182.9 cm (6')   03/16/19 182.9 cm (6')     ==================================================================  Energy Intake (outpatient):  Diet: [name] reports poor appetite and PO intake for past several weeks d/t not feeling well. Believes he is eating less than 50% of usual. Unsure of timing of weight loss but suspects majority has occurred over past few weeks with illness. Specifics of intake at home not dicussed as he was not feeling well at time of call. Amenable to trial of PO supplements (CIB no sugar added, Glucerna) while admitted until appetite/intake returns to baseline.    Food allergies:    Allergies   Allergen Reactions   ??? [place] [Aztreonam Lysine] Anaphylaxis   ??? Cefepime Itching and Nausea Only   ??? Other Anaphylaxis and Other (See Comments)     Other reaction(s): Other (See Comments)  Bananas: itchy throat  Slo Bid record from [employer] states anaphylaxis.????Pt states this was from childhood and does not know reaction.  Bananas, causes itchy throat   ??? Slo-Bid 100 Anaphylaxis   ??? Banana Itching and Nausea And Vomiting   ??? Tobramycin Tinnitus     From OSH record-documented as tinnitus but has received IV tobra with close monitoring.      Sodium in diet: likely adequate  Calcium in diet:  likely adequate  CFTR modulator and Diet: Prescribed Symdeko (tezacaftor/ivacaftor).    PO Supplements: none  Patient resources for DME/formula:  -none  Appetite Stimulant: none  Enteral feeding tube: none  Physical activity: not discussed today    Fat Malabsorption (outpatient):  Enzyme brand, (meals/snacks):  Creon 36,000 @ 7/meal or 8/meal and 5/snack or 6/snack  Enzyme administration details: correct pre-meal administration., good compliance at all meals and snacks  Enzyme dose per MEAL (units lipase/kg/meal) 2548  Enzyme  dose per DAY (units lipase/kg/day) 12743  GI meds:  Nutritionally relevant medications reviewed.   Stools (steatorrhea): not discussed today  Stools (constipation): not discussed today  GI symptoms:  none reported  Fecal Fat Studies: see below    No results found for: [id number]  Lab Results   Component Value Date    ELAST <15 (L) 03/19/2017     No results found for: PELAI    Vitamins/Minerals (outpatient):  CF-specific MVI, dose, compliance: MVW Complete Formulation Softgel regular, Softgel D-5000 2 daily, good compliance  Other vitamins/minerals/herbals:   - MVW probiotic  Patient Resources for vitamins: [facility], phone [phone]  Calcium supplement: none  Fat-soluble vitamin levels: due for repeat labs  Lab Results   Component Value Date    VITAMINA 44.4 11/19/2016     Lab Results   Component Value Date    CRP 24.9 (H) 08/15/2018    CRP 9.8 08/19/2017    CRP 7.7 11/26/2016     Lab Results   Component Value Date    VITDTOTAL 27.9 03/19/2017     No results found for: VITAME  Lab Results   Component Value Date    PT 18.7 (H) 01/29/2019    PT 17.6 (H) 11/18/2018    PT 14.2 (H) 08/13/2018    PT 12.9 (H) 08/19/2017    PT 11.1 03/29/2017     Lab Results Component Value Date    DESGCARBPT 0.2 03/20/2017     No results found for: PIVKAII    Bone Health: Last DEXA normal. July 2018. Abnormal Vitamin D as of 2018.  CF Related Diabetes: yes; followed by endocrine    No results found for: GLUF  No results found for: GLUCOSE2HR  Lab Results   Component Value Date    A1C 8.5 (H) 08/12/2018    A1C 10.7 (H) 04/06/2018    A1C 9.0 (H) 01/07/2018     Jackqulyn Livings MPH, RD, LDN  Pager: [phone]

## 2019-09-30 NOTE — Unmapped (Signed)
Patient arrived to 3Bt stable. AOx4.  Afebrile Respirations were shallow and tachypneic but had good O2 sats in the 90s.   IV abx administered.   CBGs covered with short/long acting insulin.  Appetite adequate.  Independent with ADLs. VSS.      Problem: Adult Inpatient Plan of Care  Goal: Plan of Care Review  Outcome: Progressing  Goal: Patient-Specific Goal (Individualization)  Outcome: Progressing  Goal: Absence of Hospital-Acquired Illness or Injury  Outcome: Progressing  Goal: Optimal Comfort and Wellbeing  Outcome: Progressing  Goal: Readiness for Transition of Care  Outcome: Progressing  Goal: Rounds/Family Conference  Outcome: Progressing     Problem: Infection  Goal: Infection Symptom Resolution  Outcome: Progressing

## 2019-09-30 NOTE — Unmapped (Signed)
Patient requests not to be woken up throughout the night for vital signs.

## 2019-10-01 MED ORDER — TRIKAFTA 100-50-75 MG (D)/150 MG (N) TABLETS
ORAL_TABLET | 3 refills | 0 days | Status: CP
Start: 2019-10-01 — End: ?
  Filled 2019-10-09: qty 84, 28d supply, fill #0

## 2019-10-01 NOTE — Unmapped (Signed)
Antibiotic Timeout Checklist  Indication for antibiotics: CF exacerbation  Antibiotic Start Date: 09/29/19  Current systemic antibiotics:  Anti-infectives (From admission, onward)    Start     Dose/Rate Route Frequency Ordered Stop    09/29/19 2000  tobramycin (PF) (TOBI) 300 mg/5 mL nebulizer solution 300 mg      300 mg Nebulization 2 times daily (RT) 09/29/19 1333 10/13/19 1959    09/29/19 1400  piperacillin-tazobactam (ZOSYN) IVPB (premix) 4.5 g      4.5 g  200 mL/hr over 30 Minutes Intravenous Every 6 hours 09/29/19 1333 10/13/19 1359        Microbiology Results Available? Still pending  Sensitivities Available? Still pending  Are antibiotics still indicated? YES  Is it appropriate to de-escalate? NO: on appropriate therapy  Is it appropriate to convert to PO therapy? NO  Today's antibiotic plan: No change: current antibiotics appropriate  Planned Antibiotic Duration: end on 10/13/19

## 2019-10-01 NOTE — Unmapped (Signed)
PULMONARY ADMISSION  NOTE      Patient: Christian Young(Dec 31, 1985)  Length of stay:  LOS: 2 days     Assessment and Plan:      Active Problems:    * No active hospital problems. *  Resolved Problems:    * No resolved hospital problems. *    Christian Young is a 34 y.o. 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     1. CF exacerbation w/pulmonary manifestations w/acute exacerbation of bronchiectasis: Persistent shortness of breath and cough. Completed course of Levaquin outpatient but symptoms persisted. Last sputum culture on 03/24/2019 grew 3+ smooth pseudomonas S to tobramycin, I aztreonam and resistant to all else  - F/U CF Sputum Culture   -??Continue??pip/tazo, and inhaled tobramycin for two weeks (2/16- 2/30)  -??Continue??home??Symdeko using patient's home supply  - Pulmozyme 2.5 mg BID  - montelukast 10 mg qHS  - cetirizine 10 mg daily  - Symbicort 2 puff BID not on formulary, replaced with nebulized pulmicort and arformoterol??  - HTS 7% QID w/ RT  - Aerobika and chest vest QID w/ RT    2. History of Mycobacterium abscessus infection: Had RUL lobectomy in August of 2018. Previously on clofazimine and azithromycin. Last AFB culture/smear negative 03/25/2019  -Follow up AFB culture & smear    3. CF-related PI  -Continue home creon  -Continue home multivitamin    4. CF-related DM  Last Hemoglobin A1c 8.5 07/2018  -Continue home Lantus 70U (patient stated he took this this morning, so will redose tomorrow morning)  -lispro 30 units TID AC    5. Depression, anxiety:  -Continue home fluoxetine 20mg  daily  -Continue home lamotrigine 200mg  daily  -Continue home amitriptyline 100mg  nightly    6. HTN:  -Continue home lisinopril 10mg  daily    7. History of DVT:  -Continue home xeralto 20mg  daily    8. RLS:  -Continue home pramipexole 0.25mg  daily    FEN/GI/PPX:  - Regular diet  - On xarelto for history of DVT    Please page [phone] with any questions.    [name], MD    Interval History:      Still feeling fatigued with shortness of breath and poor appetite. Port clogged, now working after needle exchange.       Review of Systems: A comprehensive review of systems was performed and was negative except as above in HPI  Past Medical History:   Diagnosis Date   ??? Anxiety    ??? Chronic pain disorder    ??? Cystic fibrosis (CMS-HCC)    ??? Depression    ??? Hypertension    ??? Nonproductive cough 04/05/2018     Past Surgical History:   Procedure Laterality Date   ??? IR INSERT PORT AGE GREATER THAN 5 YRS  03/27/2019    IR INSERT PORT AGE GREATER THAN 5 YRS 03/27/2019 Rush Barer, MD IMG [facility]   ??? PR REMOVAL OF LUNG,LOBECTOMY Right 03/29/2017    Procedure: REMOVAL OF LUNG, OTHER THAN PNEUMONECTOMY; SINGLE LOBE (LOBECTOMY);  Surgeon: Cherie Dark, MD;  Location: MAIN OR [facility];  Service: Thoracic     Medications reviewed in Epic  Allergies as of 09/29/2019 - Reviewed 09/29/2019   Allergen Reaction Noted   ??? [place] [aztreonam lysine] Anaphylaxis 12/27/2016   ??? Cefepime Itching and Nausea Only 09/06/2015   ???  Other Anaphylaxis and Other (See Comments) 09/06/2015   ??? Slo-bid 100 Anaphylaxis 06/20/2017   ??? Banana Itching and Nausea And Vomiting 07/29/2016   ??? Tobramycin Tinnitus 09/06/2015     Family History   Problem Relation Age of Onset   ??? Bipolar disorder Mother    ??? Depression Mother      Social History     Tobacco Use   ??? Smoking status: Never Smoker   ??? Smokeless tobacco: Never Used   Substance Use Topics   ??? Alcohol use: Yes     Alcohol/week: 3.0 standard drinks     Types: 3 Glasses of wine per week     Comment: 3 bottles of wine 2 days ago        Objective:      Physical Exam:  Vitals:    09/30/19 1134 09/30/19 1549 09/30/19 2033 10/01/19 0730   BP: 125/83 125/75  119/72   Pulse: 105 106 105 101   Resp: 18 18 18 18    Temp: 36.6 ??C 36.7 ??C  36.4 ??C   TempSrc: Temporal Temporal  Temporal   SpO2: 94% 95% 95% 97%   Weight:       Height:         General: Alert, well-appearing, and in no distress.  Eyes: Anicteric sclera, conjunctiva clear.  ENT:  Mucous membranes moist and intact.  Lymph: No cervical or supraclavicular adenopathy.  Lungs: Normal excursion, no dullness to percussion. Good air movement bilaterally, without wheezes or crackles. Normal upper airway sounds without evidence of stridor.  Cardiovascular: Regular rate and rhythm, S1, S2 normal, no murmur, click, rub or gallop appreciated.  Abdomen: Soft, non-tender, not distended, bowel sounds are normal, liver is not enlarged, spleen is not enlarged  Musculoskeletal: No clubbing and no synovitis.  Skin: No rashes or lesions.  Neuro: No focal neurological deficits.       Diagnostic Review:   All labs and images were personally reviewed.

## 2019-10-01 NOTE — Unmapped (Signed)
PULMONARY ADMISSION  NOTE      Patient: Christian Young(1986/06/05)  Length of stay:  LOS: 1 day     Assessment and Plan:      Active Problems:    * No active hospital problems. *  Resolved Problems:    * No resolved hospital problems. *    Christian Young is a 34 y.o. 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     1. CF exacerbation w/pulmonary manifestations w/acute exacerbation of bronchiectasis: Persistent shortness of breath and cough. Completed course of Levaquin outpatient but symptoms persisted. Last sputum culture on 03/24/2019 grew 3+ smooth pseudomonas S to tobramycin, I aztreonam and resistant to all else  - CF Sputum Culture   -??Continue??pip/tazo, and inhaled tobramycin for two weeks (2/16- 2/30)  -??Continue??home??Symdeko using patient's home supply  - Pulmozyme 2.5 mg BID  - montelukast 10 mg qHS  - cetirizine 10 mg daily  - Symbicort 2 puff BID not on formulary, replaced with nebulized pulmicort and arformoterol??  - HTS 7% QID w/ RT  - Aerobika and chest vest QID w/ RT    2. History of Mycobacterium abscessus infection: Had RUL lobectomy in August of 2018. Previously on clofazimine and azithromycin. Last AFB culture/smear negative 03/25/2019  -Follow up AFB culture & smear    3. CF-related PI  -Continue home creon  -Continue home multivitamin    4. CF-related DM  Last Hemoglobin A1c 8.5 07/2018  -Continue home Lantus 70U (patient stated he took this this morning, so will redose tomorrow morning)  -lispro 30 units TID AC    5. Depression, anxiety:  -Continue home fluoxetine 20mg  daily  -Continue home lamotrigine 200mg  daily  -Continue home amitriptyline 100mg  nightly    6. HTN:  -Continue home lisinopril 10mg  daily    7. History of DVT:  -Continue home xeralto 20mg  daily    8. RLS:  -Continue home pramipexole 0.25mg  daily    FEN/GI/PPX:  - Regular diet  - On xarelto for history of DVT    Please page [phone] with any questions.    [name], MD    Interval History:      Still feeling fatigued with shortness of breath and poor appetite. Port clogged requiring TPA.       Review of Systems: A comprehensive review of systems was performed and was negative except as above in HPI  Past Medical History:   Diagnosis Date   ??? Anxiety    ??? Chronic pain disorder    ??? Cystic fibrosis (CMS-HCC)    ??? Depression    ??? Hypertension    ??? Nonproductive cough 04/05/2018     Past Surgical History:   Procedure Laterality Date   ??? IR INSERT PORT AGE GREATER THAN 5 YRS  03/27/2019    IR INSERT PORT AGE GREATER THAN 5 YRS 03/27/2019 Rush Barer, MD IMG [facility]   ??? PR REMOVAL OF LUNG,LOBECTOMY Right 03/29/2017    Procedure: REMOVAL OF LUNG, OTHER THAN PNEUMONECTOMY; SINGLE LOBE (LOBECTOMY);  Surgeon: Cherie Dark, MD;  Location: MAIN OR [facility];  Service: Thoracic     Medications reviewed in Epic  Allergies as of 09/29/2019 - Reviewed 09/29/2019   Allergen Reaction Noted   ??? [place]ston [aztreonam lysine] Anaphylaxis 12/27/2016   ??? Cefepime Itching and Nausea Only 09/06/2015   ??? Other  Anaphylaxis and Other (See Comments) 09/06/2015   ??? Slo-bid 100 Anaphylaxis 06/20/2017   ??? Banana Itching and Nausea And Vomiting 07/29/2016   ??? Tobramycin Tinnitus 09/06/2015     Family History   Problem Relation Age of Onset   ??? Bipolar disorder Mother    ??? Depression Mother      Social History     Tobacco Use   ??? Smoking status: Never Smoker   ??? Smokeless tobacco: Never Used   Substance Use Topics   ??? Alcohol use: Yes     Alcohol/week: 3.0 standard drinks     Types: 3 Glasses of wine per week     Comment: 3 bottles of wine 2 days ago        Objective:      Physical Exam:  Vitals:    09/30/19 0954 09/30/19 1134 09/30/19 1549 09/30/19 2033   BP:  125/83 125/75    Pulse: 110 105 106 105   Resp: 16 18 18 18    Temp:  36.6 ??C 36.7 ??C    TempSrc:  Temporal Temporal    SpO2: 96% 94% 95% 95%   Weight:       Height:         General: Alert, well-appearing, and in no distress.  Eyes: Anicteric sclera, conjunctiva clear.  ENT:  Mucous membranes moist and intact.  Lymph: No cervical or supraclavicular adenopathy.  Lungs: Normal excursion, no dullness to percussion. Good air movement bilaterally, without wheezes or crackles. Normal upper airway sounds without evidence of stridor.  Cardiovascular: Regular rate and rhythm, S1, S2 normal, no murmur, click, rub or gallop appreciated.  Abdomen: Soft, non-tender, not distended, bowel sounds are normal, liver is not enlarged, spleen is not enlarged  Musculoskeletal: No clubbing and no synovitis.  Skin: No rashes or lesions.  Neuro: No focal neurological deficits.       Diagnostic Review:   All labs and images were personally reviewed.

## 2019-10-01 NOTE — Unmapped (Signed)
No acute events overnight. Patient's breathing better this shift. Less rapid and less shallow. VSS. Mild c/o pain relieved with oxy 5mg  per PRN order.       Problem: Adult Inpatient Plan of Care  Goal: Plan of Care Review  Outcome: Ongoing - Unchanged  Goal: Patient-Specific Goal (Individualization)  Outcome: Ongoing - Unchanged  Goal: Absence of Hospital-Acquired Illness or Injury  Outcome: Ongoing - Unchanged  Goal: Optimal Comfort and Wellbeing  Outcome: Ongoing - Unchanged  Goal: Readiness for Transition of Care  Outcome: Ongoing - Unchanged  Goal: Rounds/Family Conference  Outcome: Ongoing - Unchanged     Problem: Infection  Goal: Infection Symptom Resolution  Outcome: Ongoing - Unchanged     Problem: Diabetes Comorbidity  Goal: Blood Glucose Level Within Desired Range  Outcome: Ongoing - Unchanged     Problem: Hypertension Comorbidity  Goal: Blood Pressure in Desired Range  Outcome: Ongoing - Unchanged     Problem: Wound  Goal: Optimal Wound Healing  Outcome: Ongoing - Unchanged

## 2019-10-01 NOTE — Unmapped (Signed)
Per test claim for [employer] at the [facility], patient needs Medication Assistance Program for Prior Authorization.

## 2019-10-01 NOTE — Unmapped (Signed)
I discussed with Mr Babington about starting Trikafta given new data where he could qualify based on one of his mutations. He understands that it may not have a robust response but since some patients have benefited, he is willing to try. I reiterated that compliance will have to be 100% and that if he discontinues we ought to know right away. I also told him that he will need frequent monitoring. I gave him details on how to use the drug as below.    I then discussed with Alton Revere, expert pharmacist in CF, and she has agreed to start the process. Dr Tresa Res, who is his inpatient provider, is aware as well. We will get a new set of liver panel tomorrow.    This information will also be given to him via MyChart      Maia Plan, MD  PGY 5, Pulmonary and Critical Care  Pager: [phone]  October 01, 2019 12:10 PM         TRIKAFTA (elexacaftor/tezacaftor/ivacaftor and ivacaftor)     Used for the treatment of cystic fibrosis (CF) in patients aged 12 years and older who have at least 1 copy of the F508del mutation or at least one other responsive mutation.    Medication & Administration     How Supplied:   ? Each month supply of TRIKAFTA comes as a 84-count tablet carton that contains 4 weekly wallets, each wallet contains 14 tablets of elexacaftor/tezacaftor/ivacaftor and 7 tablets of ivacaftor)  ? TRIKAFTA is co-packaged as a fixed dose combination tablet of elexacaftor/tezacaftor/ivacaftor and a separate ivacaftor tablet  o 2 orange tablets (marked T100) as the combination of elexacaftor, tezacaftor and ivacaftor.   o The blue tablet (marked V150) contains only ivacaftor.     Dosing: Patients 12 years and older: 2 orange tablets (elexacaftor 100mg /tezacaftor 50mg /ivacaftor 75mg ) and 1 blue tablet (ivacaftor 150mg ) in the evening, 12 hours apart. Taken with fatty food.    Administration:   ? Take with food that contains high fat.  o Examples: eggs, butter, peanut butter, nuts, avocado, or whole-milk dairy products.     Missed dose instructions:  ? If AM dose missed (ORANGE tablets):  If less than 6 hours since your usual AM dose: Take missed AM dose as soon as you remember Take PM dose at the usual time   If more than 6 hours since your usual AM dose: Take missed AM dose as soon as you remember SKIP the PM dose that day     ? If you missed the PM dose (BLUE tablets):  If less than 6 hours since your usual PM dose: Take missed PM dose as soon as you remember Take tomorrow's AM dose at the usual time   If more than 6 hours since your usual PM dose: SKIP the PM dose that day Take tomorrow's AM dose at the usual time     Storage:   ? Store at room temperature    Common Side Effects     ? Rash   ? Abdominal pain and/or diarrhea  ? Headache, dizziness  ? Runny nose  ? Influenza     Warning and Precautions     Please note that some patients have experienced a temporary increase in their sputum production shortly after starting TRIKAFTA. This may be a sign that TRIKAFTA is working to help clear your secretions. If you feel sick, short of breath, have a fever, or blood in your sputum, please call the CF Clinic as  you normally would.    Monitoring:  ? For adult patients: Monitor for hepatic impairment.    Hepatic Impairment:   ? Your team with check your liver function tests (LFTs) prior to starting treatment and 3 months the first year you are on TRIKAFTA to assess any changes that may occur.   ? If stable after the first year, your liver function tests will be checked annually.  ? Notify your physician if you start noticing any of the following symptoms:  o Yellowing of the skin or the white part of your eyes (jaundice)  o Nausea or vomiting, diarrhea, or abdominal pain  o Dark or amber-colored urine  o Bruising or bleeding, itching, rash  o Fever    Interactions:  ? If you start to take any new medications please check with your CF provider prior to use as it may interact with TRIKAFTA.  ? Most common medications that interact: Strong and Moderate CYP3A4 Inhibitors (such as the azole antifungals) and Strong CYP3A4 Inducers (such as rifampin).

## 2019-10-01 NOTE — Unmapped (Signed)
Patient was compliant with all inhaled scheduled medications and was under no apparent distress.  Pt has home chest vest and uses without assistance, pt also request use BID RT updated orders

## 2019-10-01 NOTE — Unmapped (Signed)
TRIKAFTA Clinical Review Note     Christian Young is diagnosed with cystic fibrosis and eligible for treatment with TRIKAFTA (elexacaftor/tezacaftor/ivacaftor and ivacaftor).     CF Genotype [phone]   Current Modulator Symdeko   Other pertinent history N/A   Drug Interactions No significant drug interactions identified; Trikafta is a Pgp inhibitor that could theoretically affect rivaroxaban concentrations, however, drug info resources note to  avoid combining rivaroxaban with a P-gp inhibitor in patients also receiving a strong CYP3A4 inhibitor. [name] is not on a strong CYP3A4 inhibitor.     Pertinent Labs: will plan to recheck LFTs tomorrow.  Most recent LFTs:   Lab Results   Component Value Date    ALKPHOS  09/29/2019      Comment:      Hemolyzed Specimen    BILITOT 0.5 09/29/2019    BILIDIR <0.10 08/15/2018    PROT 7.5 09/29/2019    ALBUMIN 4.2 09/29/2019    ALT 42 09/29/2019    AST  09/29/2019      Comment:      Hemolyzed Specimen       Recommendations:    Dose Patients 12 years and older: 2 orange tablets (elexacaftor 100mg /tezacaftor 50mg /ivacaftor 75mg ) and 1 blue tablet (ivacaftor 150mg ) in the evening, 12 hours apart. Taken with fatty food.   Monitoring Patients 12 years old and up: Baseline LFTs, then every 3 months the first year of Trikafta.     Access:  ? Patient will be offered enrollment into [employer] Guidance and Patient Support (GPS) Program  ? Patient will be offered [facility] Medication Assistance Program services to allow evaluation of eligible medication assistance through [facility], [employer], and other potential programs.    Plan:  ? Patient counseled by a CF provider on dosing/administration, missed dose instructions, adverse effects, and monitoring during visit.  ? AVS updated with TRIKAFTA Patient Information.  ? TRIKAFTA sent to preferred specialty pharmacy and encouraged to notify CF nurse coordinators when starting  ? If patient has current modulator on profile, will discontinue order upon start of TRIKAFTA.  ? TRIKAFTA sent to [facility] for test claim/PA initiation. If TRIKAFTA approved while patient still admitted at [facility], okay to start while admitted. This supply would need to go home with the patient at discharge.    Alton Revere, PharmD, BCPS, CPP  Clinical Pharmacist Practitioner  [facility]

## 2019-10-01 NOTE — Unmapped (Signed)
No acute changes noted at this time. Patient denies any pain or needs at this time. Patient has been sleep during most of this shift. Will continue to monitor.    Problem: Adult Inpatient Plan of Care  Goal: Plan of Care Review  Outcome: Ongoing - Unchanged  Goal: Patient-Specific Goal (Individualization)  Outcome: Ongoing - Unchanged  Goal: Absence of Hospital-Acquired Illness or Injury  Outcome: Ongoing - Unchanged  Goal: Optimal Comfort and Wellbeing  Outcome: Ongoing - Unchanged  Goal: Readiness for Transition of Care  Outcome: Ongoing - Unchanged  Goal: Rounds/Family Conference  Outcome: Ongoing - Unchanged     Problem: Infection  Goal: Infection Symptom Resolution  Outcome: Ongoing - Unchanged     Problem: Diabetes Comorbidity  Goal: Blood Glucose Level Within Desired Range  Outcome: Ongoing - Unchanged     Problem: Hypertension Comorbidity  Goal: Blood Pressure in Desired Range  Outcome: Ongoing - Unchanged     Problem: Wound  Goal: Optimal Wound Healing  Outcome: Ongoing - Unchanged

## 2019-10-02 LAB — CBC
HEMATOCRIT: 38.3 % (ref 38.0–50.0)
HEMOGLOBIN: 13 g/dL — ABNORMAL LOW (ref 13.5–17.5)
MEAN CORPUSCULAR HEMOGLOBIN CONC: 33.8 g/dL (ref 30.0–36.0)
MEAN CORPUSCULAR VOLUME: 80.1 fL — ABNORMAL LOW (ref 81.0–95.0)
MEAN PLATELET VOLUME: 8 fL (ref 7.0–10.0)
PLATELET COUNT: 317 10*9/L (ref 150–450)
RED BLOOD CELL COUNT: 4.79 10*12/L (ref 4.32–5.72)
RED CELL DISTRIBUTION WIDTH: 15.2 % — ABNORMAL HIGH (ref 12.0–15.0)
WBC ADJUSTED: 9.8 10*9/L (ref 3.5–10.5)

## 2019-10-02 LAB — PROTEIN TOTAL: Protein:MCnc:Pt:Ser/Plas:Qn:: 6.9

## 2019-10-02 LAB — HEPATIC FUNCTION PANEL
ALBUMIN: 3.7 g/dL (ref 3.5–5.0)
AST (SGOT): 34 U/L (ref 19–55)
BILIRUBIN TOTAL: 0.5 mg/dL (ref 0.0–1.2)
PROTEIN TOTAL: 6.9 g/dL (ref 6.5–8.3)

## 2019-10-02 LAB — BASIC METABOLIC PANEL
ANION GAP: 13 mmol/L (ref 7–15)
BLOOD UREA NITROGEN: 14 mg/dL (ref 7–21)
BUN / CREAT RATIO: 18
CALCIUM: 9.2 mg/dL (ref 8.5–10.2)
CHLORIDE: 98 mmol/L (ref 98–107)
CO2: 27 mmol/L (ref 22.0–30.0)
CREATININE: 0.76 mg/dL (ref 0.70–1.30)
GLUCOSE RANDOM: 132 mg/dL — ABNORMAL HIGH (ref 70–99)
POTASSIUM: 4.3 mmol/L (ref 3.5–5.0)
SODIUM: 138 mmol/L (ref 135–145)

## 2019-10-02 LAB — HEMATOCRIT: Hematocrit:VFr:Pt:Bld:Qn:: 38.3

## 2019-10-02 LAB — GLUCOSE RANDOM: Glucose:MCnc:Pt:Ser/Plas:Qn:: 132 — ABNORMAL HIGH

## 2019-10-02 NOTE — Unmapped (Signed)
Attempted to reach Christian Young today to review PO supplement options and adjust orders as needed. Unable to reach via telephone. Will continue to follow 1-2x weekly (more frequently as indicated)    Christian Young MPH, RD, LDN  Pager: [phone]

## 2019-10-02 NOTE — Unmapped (Signed)
Problem: Adult Inpatient Plan of Care  Goal: Plan of Care Review  Outcome: Progressing  Goal: Patient-Specific Goal (Individualization)  Outcome: Progressing  Goal: Absence of Hospital-Acquired Illness or Injury  Outcome: Progressing  Goal: Optimal Comfort and Wellbeing  Outcome: Progressing  Goal: Readiness for Transition of Care  Outcome: Progressing  Goal: Rounds/Family Conference  Outcome: Progressing   VSS. Oxycodone given for c/o chest pain after a coughing spell. MD notified for extra dose. Antibiotics delivered as ordered. L CW POC CDI.

## 2019-10-02 NOTE — Unmapped (Signed)
Patient did well with doing all of their treatments today. Patient did their airway clearance without complications.

## 2019-10-02 NOTE — Unmapped (Signed)
Patient states he's not feeling well, paged Dr. Tresa Res because patient states he has questions. Patient denies any pain or SOB. Patient denies any needs at this time, will continue to monitor.    Problem: Adult Inpatient Plan of Care  Goal: Plan of Care Review  Outcome: Ongoing - Unchanged  Goal: Patient-Specific Goal (Individualization)  Outcome: Ongoing - Unchanged  Goal: Absence of Hospital-Acquired Illness or Injury  Outcome: Ongoing - Unchanged  Goal: Optimal Comfort and Wellbeing  Outcome: Ongoing - Unchanged  Goal: Readiness for Transition of Care  Outcome: Ongoing - Unchanged  Goal: Rounds/Family Conference  Outcome: Ongoing - Unchanged     Problem: Infection  Goal: Infection Symptom Resolution  Outcome: Ongoing - Unchanged     Problem: Diabetes Comorbidity  Goal: Blood Glucose Level Within Desired Range  Outcome: Ongoing - Unchanged     Problem: Hypertension Comorbidity  Goal: Blood Pressure in Desired Range  Outcome: Ongoing - Unchanged     Problem: Wound  Goal: Optimal Wound Healing  Outcome: Ongoing - Unchanged

## 2019-10-03 LAB — BASIC METABOLIC PANEL
ANION GAP: 11 mmol/L (ref 7–15)
BLOOD UREA NITROGEN: 14 mg/dL (ref 7–21)
CALCIUM: 8.8 mg/dL (ref 8.5–10.2)
CHLORIDE: 99 mmol/L (ref 98–107)
CO2: 30 mmol/L (ref 22.0–30.0)
CREATININE: 0.76 mg/dL (ref 0.70–1.30)
EGFR CKD-EPI AA MALE: >90 mL/min/{1.73_m2} (ref >=60)
EGFR CKD-EPI NON-AA MALE: >90 mL/min/{1.73_m2} (ref >=60)
GLUCOSE RANDOM: 174 mg/dL — ABNORMAL HIGH (ref 70–99)
POTASSIUM: 4 mmol/L (ref 3.5–5.0)

## 2019-10-03 LAB — CBC
HEMATOCRIT: 37.9 % — ABNORMAL LOW (ref 38.0–50.0)
HEMOGLOBIN: 12.7 g/dL — ABNORMAL LOW (ref 13.5–17.5)
MEAN CORPUSCULAR HEMOGLOBIN CONC: 33.5 g/dL (ref 30.0–36.0)
MEAN CORPUSCULAR VOLUME: 80.6 fL — ABNORMAL LOW (ref 81.0–95.0)
MEAN PLATELET VOLUME: 7.8 fL (ref 7.0–10.0)
PLATELET COUNT: 312 10*9/L (ref 150–450)
RED BLOOD CELL COUNT: 4.7 10*12/L (ref 4.32–5.72)
RED CELL DISTRIBUTION WIDTH: 15.5 % — ABNORMAL HIGH (ref 12.0–15.0)
WBC ADJUSTED: 8.8 10*9/L (ref 3.5–10.5)

## 2019-10-03 LAB — BUN / CREAT RATIO: Urea nitrogen/Creatinine:MRto:Pt:Ser/Plas:Qn:: 18

## 2019-10-03 LAB — HEMOGLOBIN: Hemoglobin:MCnc:Pt:Bld:Qn:: 12.7 — ABNORMAL LOW

## 2019-10-03 NOTE — Unmapped (Signed)
PULMONARY ADMISSION  NOTE      Patient: Christian Young(09/18/1985)  Length of stay:  LOS: 4 days     Assessment and Plan:      Active Problems:    * No active hospital problems. *  Resolved Problems:    * No resolved hospital problems. *    Christian Young is a 34 y.o. 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     1. CF exacerbation w/pulmonary manifestations w/acute exacerbation of bronchiectasis: Persistent shortness of breath and cough. Completed course of Levaquin outpatient but symptoms persisted. Last sputum culture on 03/24/2019 grew 3+ smooth pseudomonas S to tobramycin, I aztreonam and resistant to all else  - F/U CF Sputum Culture, now growing pseudomonas and staph aureus   -??Continue??pip/tazo, but will switch inhaled tobramycin to IV tobramycin due to lack of improvement in clinical symptoms thus far. Will follow up staph aureus on culture, if MRSA will add Vanc. Will continue antibiotics for two weeks (2/16- 2/30)  -??Continue??home??Symdeko using patient's home supply  - Pulmozyme 2.5 mg BID  - montelukast 10 mg qHS  - cetirizine 10 mg daily  - Symbicort 2 puff BID not on formulary, replaced with nebulized pulmicort and arformoterol??  - HTS 7% QID w/ RT  - Aerobika and chest vest QID w/ RT    2. History of Mycobacterium abscessus infection: Had RUL lobectomy in August of 2018. Previously on clofazimine and azithromycin. Last AFB culture/smear negative 03/25/2019  -Follow up AFB culture & smear    3. CF-related PI  -Continue home creon  -Continue home multivitamin    4. CF-related DM  Last Hemoglobin A1c 8.5 07/2018  -Continue home Lantus 70U (patient stated he took this this morning, so will redose tomorrow morning)  -lispro 30 units TID AC    5. Depression, anxiety:  -Continue home fluoxetine 20mg  daily  -Continue home lamotrigine 200mg  daily  -Continue home amitriptyline 100mg  nightly    6. HTN:  -Continue home lisinopril 10mg  daily    7. History of DVT:  -Continue home xeralto 20mg  daily    8. RLS:  -Continue home pramipexole 0.25mg  daily    FEN/GI/PPX:  - Regular diet  - On xarelto for history of DVT    Please page [phone] with any questions.    Christian Edwards, MD    Interval History:      Still feeling fatigued with shortness of breath. Needle for port access came out over night, difficulty re-accessing which has caused some discomfort/pain       Review of Systems: A comprehensive review of systems was performed and was negative except as above in HPI  Past Medical History:   Diagnosis Date   ??? Anxiety    ??? Chronic pain disorder    ??? Cystic fibrosis (CMS-HCC)    ??? Depression    ??? Hypertension    ??? Nonproductive cough 04/05/2018     Past Surgical History:   Procedure Laterality Date   ??? IR INSERT PORT AGE GREATER THAN 5 YRS  03/27/2019    IR INSERT PORT AGE GREATER THAN 5 YRS 03/27/2019 Christian Barer, MD IMG VIR [facility]   ??? PR REMOVAL OF LUNG,LOBECTOMY Right 03/29/2017    Procedure: REMOVAL OF LUNG, OTHER THAN PNEUMONECTOMY; SINGLE LOBE (LOBECTOMY);  Surgeon: Christian Dark, MD;  Location: MAIN [facility];  Service: Thoracic     Medications reviewed in Epic  Allergies as of 09/29/2019 - Reviewed 09/29/2019   Allergen Reaction Noted   ??? [place] [aztreonam lysine] Anaphylaxis 12/27/2016   ??? Cefepime Itching and Nausea Only 09/06/2015   ??? Other Anaphylaxis and Other (See Comments) 09/06/2015   ??? Slo-bid 100 Anaphylaxis 06/20/2017   ??? Banana Itching and Nausea And Vomiting 07/29/2016   ??? Tobramycin Tinnitus 09/06/2015     Family History   Problem Relation Age of Onset   ??? Bipolar disorder Mother    ??? Depression Mother      Social History     Tobacco Use   ??? Smoking status: Never Smoker   ??? Smokeless tobacco: Never Used   Substance Use Topics   ??? Alcohol use: Yes     Alcohol/week: 3.0 standard drinks     Types: 3 Glasses of wine per week     Comment: 3 bottles of wine 2 days ago        Objective:      Physical Exam:  Vitals:    10/02/19 1600 10/02/19 2013 10/02/19 2300 10/03/19 0833   BP: 128/76  123/67 116/71   Pulse: 114 107 98 64   Resp: 18 18 20 20    Temp: 36.3 ??C  36.4 ??C 36.1 ??C   TempSrc: Temporal  Temporal Temporal   SpO2: 97% 96% 97% 98%   Weight:       Height:         General: Alert, well-appearing, and in no distress.  Eyes: Anicteric sclera, conjunctiva clear.  ENT:  Mucous membranes moist and intact.  Lymph: No cervical or supraclavicular adenopathy.  Lungs: Normal excursion, no dullness to percussion. Good air movement bilaterally, without wheezes or crackles. Normal upper airway sounds without evidence of stridor.  Cardiovascular: Regular rate and rhythm, S1, S2 normal, no murmur, click, rub or gallop appreciated.  Abdomen: Soft, non-tender, not distended, bowel sounds are normal, liver is not enlarged, spleen is not enlarged  Musculoskeletal: No clubbing and no synovitis.  Skin: No rashes or lesions.  Neuro: No focal neurological deficits.       Diagnostic Review:   All labs and images were personally reviewed.

## 2019-10-03 NOTE — Unmapped (Signed)
Problem: Adult Inpatient Plan of Care  Goal: Plan of Care Review  Outcome: Progressing  Goal: Patient-Specific Goal (Individualization)  Outcome: Progressing  Goal: Absence of Hospital-Acquired Illness or Injury  Outcome: Progressing  Goal: Optimal Comfort and Wellbeing  Outcome: Progressing  Goal: Readiness for Transition of Care  Outcome: Progressing  Goal: Rounds/Family Conference  Outcome: Progressing

## 2019-10-03 NOTE — Unmapped (Signed)
Aminoglycoside Therapeutic Monitoring Pharmacy Note    Christian Young is a 34 y.o. male starting tobramycin. Date of therapy initiation: 10/02/19    Indication: CF exacerbation    Prior Dosing Information: Previous regimen 200 mg IV q12h infused over 30 minutes 01/2019     Goals:  Therapeutic Drug Levels  ?? Trough level: tobramycin <1 mg/L  ?? Peak level: tobramycin 10-14 mg/L    Additional Clinical Monitoring/Outcomes  Renal function, volume status (intake and output)    Results:   ?? Not applicable   ?? Not applicable    Wt Readings from Last 1 Encounters:   09/29/19 (!) 113 kg (249 lb 1.9 oz)     Lab Results   Component Value Date    CREATININE 0.76 10/02/2019       Pharmacokinetic Considerations and Significant Drug Interactions:  ? Adult (estimated initial): Vd = 30.3 L, ke = 0.298 hr-1  ? Concurrent nephrotoxic meds: Zosyn    Assessment/Plan:  Recommendation(s)  ? Start 200 mg IV q12h over 30 minutes  ? Estimated peak and trough on recommended regimen: peak = 6.3 mg/L, trough = 0.2 mg/L    Follow-up  ? Level due: peak and trough levels around the third or fourth dose  ? A pharmacist will continue to monitor and order levels as appropriate    Please page service pharmacist with questions/clarifications.    Colin Mulders, PharmD

## 2019-10-03 NOTE — Unmapped (Signed)
Patient is on room air. Breathing treatments done as ordered. Home chest vest done airway clearance

## 2019-10-03 NOTE — Unmapped (Signed)
PULMONARY ADMISSION  NOTE      Patient: Christian Young(1986-04-12)  Length of stay:  LOS: 3 days     Assessment and Plan:      Active Problems:    * No active hospital problems. *  Resolved Problems:    * No resolved hospital problems. *    Christian Young is a 34 y.o. 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     1. CF exacerbation w/pulmonary manifestations w/acute exacerbation of bronchiectasis: Persistent shortness of breath and cough. Completed course of Levaquin outpatient but symptoms persisted. Last sputum culture on 03/24/2019 grew 3+ smooth pseudomonas S to tobramycin, I aztreonam and resistant to all else  - F/U CF Sputum Culture, now growing pseudomonas and staph aureus   -??Continue??pip/tazo, but will switch inhaled tobramycin to IV tobramycin due to lack of improvement in clinical symptoms thus far. Will follow up staph aureus on culture, if MRSA will add Vanc. Will continue antibiotics for two weeks (2/16- 2/30)  -??Continue??home??Symdeko using patient's home supply  - Pulmozyme 2.5 mg BID  - montelukast 10 mg qHS  - cetirizine 10 mg daily  - Symbicort 2 puff BID not on formulary, replaced with nebulized pulmicort and arformoterol??  - HTS 7% QID w/ RT  - Aerobika and chest vest QID w/ RT    2. History of Mycobacterium abscessus infection: Had RUL lobectomy in August of 2018. Previously on clofazimine and azithromycin. Last AFB culture/smear negative 03/25/2019  -Follow up AFB culture & smear    3. CF-related PI  -Continue home creon  -Continue home multivitamin    4. CF-related DM  Last Hemoglobin A1c 8.5 07/2018  -Continue home Lantus 70U (patient stated he took this this morning, so will redose tomorrow morning)  -lispro 30 units TID AC    5. Depression, anxiety:  -Continue home fluoxetine 20mg  daily  -Continue home lamotrigine 200mg  daily  -Continue home amitriptyline 100mg  nightly    6. HTN:  -Continue home lisinopril 10mg  daily    7. History of DVT:  -Continue home xeralto 20mg  daily    8. RLS:  -Continue home pramipexole 0.25mg  daily    FEN/GI/PPX:  - Regular diet  - On xarelto for history of DVT    Please page [phone] with any questions.    [name], MD    Interval History:      Still feeling fatigued with shortness of breath and poor appetite. No improvement in symptoms since admission despite antibiotic therapy       Review of Systems: A comprehensive review of systems was performed and was negative except as above in HPI  Past Medical History:   Diagnosis Date   ??? Anxiety    ??? Chronic pain disorder    ??? Cystic fibrosis (CMS-HCC)    ??? Depression    ??? Hypertension    ??? Nonproductive cough 04/05/2018     Past Surgical History:   Procedure Laterality Date   ??? IR INSERT PORT AGE GREATER THAN 5 YRS  03/27/2019    IR INSERT PORT AGE GREATER THAN 5 YRS 03/27/2019 Rush Barer, MD IMG VIR [facility]   ??? PR REMOVAL OF LUNG,LOBECTOMY Right 03/29/2017    Procedure: REMOVAL OF LUNG, OTHER THAN PNEUMONECTOMY; SINGLE LOBE (LOBECTOMY);  Surgeon: Cherie Dark, MD;  [facility];  Service: Thoracic  Medications reviewed in Epic  Allergies as of 09/29/2019 - Reviewed 09/29/2019   Allergen Reaction Noted   ??? [place] [aztreonam lysine] Anaphylaxis 12/27/2016   ??? Cefepime Itching and Nausea Only 09/06/2015   ??? Other Anaphylaxis and Other (See Comments) 09/06/2015   ??? Slo-bid 100 Anaphylaxis 06/20/2017   ??? Banana Itching and Nausea And Vomiting 07/29/2016   ??? Tobramycin Tinnitus 09/06/2015     Family History   Problem Relation Age of Onset   ??? Bipolar disorder Mother    ??? Depression Mother      Social History     Tobacco Use   ??? Smoking status: Never Smoker   ??? Smokeless tobacco: Never Used   Substance Use Topics   ??? Alcohol use: Yes     Alcohol/week: 3.0 standard drinks     Types: 3 Glasses of wine per week Comment: 3 bottles of wine 2 days ago        Objective:      Physical Exam:  Vitals:    10/02/19 0400 10/02/19 0747 10/02/19 1100 10/02/19 1600   BP: 122/70 108/60 122/72 128/76   Pulse: 89 87 110 114   Resp: 20 18 18 18    Temp:  36.7 ??C 36.3 ??C 36.3 ??C   TempSrc:  Temporal Temporal Temporal   SpO2: 98% 98% 93% 97%   Weight:       Height:         General: Alert, well-appearing, and in no distress.  Eyes: Anicteric sclera, conjunctiva clear.  ENT:  Mucous membranes moist and intact.  Lymph: No cervical or supraclavicular adenopathy.  Lungs: Normal excursion, no dullness to percussion. Good air movement bilaterally, without wheezes or crackles. Normal upper airway sounds without evidence of stridor.  Cardiovascular: Regular rate and rhythm, S1, S2 normal, no murmur, click, rub or gallop appreciated.  Abdomen: Soft, non-tender, not distended, bowel sounds are normal, liver is not enlarged, spleen is not enlarged  Musculoskeletal: No clubbing and no synovitis.  Skin: No rashes or lesions.  Neuro: No focal neurological deficits.       Diagnostic Review:   All labs and images were personally reviewed.

## 2019-10-04 LAB — BASIC METABOLIC PANEL
ANION GAP: 11 mmol/L (ref 7–15)
BLOOD UREA NITROGEN: 13 mg/dL (ref 7–21)
BUN / CREAT RATIO: 15
CALCIUM: 9.2 mg/dL (ref 8.5–10.2)
CO2: 30 mmol/L (ref 22.0–30.0)
EGFR CKD-EPI AA MALE: >90 mL/min/{1.73_m2} (ref >=60)
GLUCOSE RANDOM: 169 mg/dL (ref 70–179)
POTASSIUM: 4.4 mmol/L (ref 3.5–5.0)
SODIUM: 139 mmol/L (ref 135–145)

## 2019-10-04 LAB — CBC
HEMOGLOBIN: 12.8 g/dL — ABNORMAL LOW (ref 13.5–17.5)
MEAN CORPUSCULAR HEMOGLOBIN CONC: 33.2 g/dL (ref 30.0–36.0)
MEAN CORPUSCULAR VOLUME: 81.7 fL (ref 81.0–95.0)
MEAN PLATELET VOLUME: 7.8 fL (ref 7.0–10.0)
PLATELET COUNT: 318 10*9/L (ref 150–450)
RED BLOOD CELL COUNT: 4.71 10*12/L (ref 4.32–5.72)
RED CELL DISTRIBUTION WIDTH: 15.5 % — ABNORMAL HIGH (ref 12.0–15.0)
WBC ADJUSTED: 6.8 10*9/L (ref 3.5–10.5)

## 2019-10-04 LAB — TOBRAMYCIN PEAK: Tobramycin^peak:MCnc:Pt:Ser/Plas:Qn:: 4.8

## 2019-10-04 LAB — TOBRAMYCIN RANDOM: Tobramycin:MCnc:Pt:Ser/Plas:Qn:: 0.9

## 2019-10-04 LAB — HEMOGLOBIN: Hemoglobin:MCnc:Pt:Bld:Qn:: 12.8 — ABNORMAL LOW

## 2019-10-04 LAB — SODIUM: Sodium:SCnc:Pt:Ser/Plas:Qn:: 139

## 2019-10-04 NOTE — Unmapped (Signed)
Problem: Adult Inpatient Plan of Care  Goal: Plan of Care Review  Outcome: Progressing  Goal: Patient-Specific Goal (Individualization)  Outcome: Progressing  Goal: Absence of Hospital-Acquired Illness or Injury  Outcome: Progressing  Goal: Optimal Comfort and Wellbeing  Outcome: Progressing  Goal: Readiness for Transition of Care  Outcome: Progressing  Goal: Rounds/Family Conference  Outcome: Progressing     Problem: Infection  Goal: Infection Symptom Resolution  Outcome: Progressing     Problem: Diabetes Comorbidity  Goal: Blood Glucose Level Within Desired Range  Outcome: Progressing     Problem: Hypertension Comorbidity  Goal: Blood Pressure in Desired Range  Outcome: Progressing     Problem: Wound  Goal: Optimal Wound Healing  Outcome: Progressing  Pt with no acute changes as of this time. VSS and pt currently denies pain. Pt voiding spontaneously and active bowel sounds in all quadrants. Tolerating diet. Neurovascular systems intact with palpable pulses and brisk capillary refill in all extremities, strong dorsi/plantarflexion and no complaints of new onset numbness/tingling. Pain managed with PRN oxycodone x 1. Pt did not verbalize any complaints or concerns at this time and in NAD. Will continue to monitor.

## 2019-10-04 NOTE — Unmapped (Signed)
Problem: Adult Inpatient Plan of Care  Goal: Plan of Care Review  Outcome: Progressing  Goal: Patient-Specific Goal (Individualization)  Outcome: Progressing  Goal: Absence of Hospital-Acquired Illness or Injury  Outcome: Progressing  Goal: Optimal Comfort and Wellbeing  Outcome: Progressing  Goal: Readiness for Transition of Care  Outcome: Progressing  Goal: Rounds/Family Conference  Outcome: Progressing     Problem: Infection  Goal: Infection Symptom Resolution  Outcome: Progressing     Problem: Diabetes Comorbidity  Goal: Blood Glucose Level Within Desired Range  Outcome: Progressing     Problem: Hypertension Comorbidity  Goal: Blood Pressure in Desired Range  Outcome: Progressing     Problem: Wound  Goal: Optimal Wound Healing  Outcome: Progressing

## 2019-10-05 LAB — BASIC METABOLIC PANEL
ANION GAP: 11 mmol/L (ref 7–15)
BUN / CREAT RATIO: 14
CALCIUM: 9.1 mg/dL (ref 8.5–10.2)
CHLORIDE: 97 mmol/L — ABNORMAL LOW (ref 98–107)
CO2: 30 mmol/L (ref 22.0–30.0)
CREATININE: 0.91 mg/dL (ref 0.70–1.30)
EGFR CKD-EPI AA MALE: >90 mL/min/{1.73_m2} (ref >=60)
EGFR CKD-EPI NON-AA MALE: >90 mL/min/{1.73_m2} (ref >=60)
GLUCOSE RANDOM: 161 mg/dL (ref 70–179)
POTASSIUM: 4.7 mmol/L (ref 3.5–5.0)
SODIUM: 138 mmol/L (ref 135–145)

## 2019-10-05 LAB — CBC
HEMATOCRIT: 39.3 % (ref 38.0–50.0)
HEMOGLOBIN: 13.1 g/dL — ABNORMAL LOW (ref 13.5–17.5)
MEAN CORPUSCULAR HEMOGLOBIN CONC: 33.4 g/dL (ref 30.0–36.0)
MEAN CORPUSCULAR HEMOGLOBIN: 26.9 pg (ref 26.0–34.0)
MEAN CORPUSCULAR VOLUME: 80.4 fL — ABNORMAL LOW (ref 81.0–95.0)
MEAN PLATELET VOLUME: 7.8 fL (ref 7.0–10.0)
PLATELET COUNT: 337 10*9/L (ref 150–450)
RED BLOOD CELL COUNT: 4.88 10*12/L (ref 4.32–5.72)
WBC ADJUSTED: 7.6 10*9/L (ref 3.5–10.5)

## 2019-10-05 LAB — MEAN CORPUSCULAR VOLUME: Erythrocyte mean corpuscular volume:EntVol:Pt:RBC:Qn:Automated count: 80.4 — ABNORMAL LOW

## 2019-10-05 LAB — BLOOD UREA NITROGEN: Urea nitrogen:MCnc:Pt:Ser/Plas:Qn:: 13

## 2019-10-05 NOTE — Unmapped (Signed)
Aminoglycoside Therapeutic Monitoring Pharmacy Note    Christian Young is a 34 y.o. male continuing tobramycin. Date of therapy initiation: 10/02/2019    Indication: CF exacerbation    Prior Dosing Information: Current regimen tobramycin 200 mg IV Q12H  .    Goals:  Therapeutic Drug Levels  ?? Trough level: tobramycin <1 mg/L  ?? Peak level: tobramycin 10-14 mg/L    Additional Clinical Monitoring/Outcomes  Renal function, volume status (intake and output)    Results:   ?? Trough level: 0.9 mg/L, drawn appropriately (Extrapolated trough: 0.4 mg/L)   ?? Peak level: 4.8 mg/L, drawn appropriately (Extrapolated Peak: 6.6 mg/L)    Wt Readings from Last 1 Encounters:   09/29/19 (!) 113 kg (249 lb 1.9 oz)     Lab Results   Component Value Date    CREATININE 0.89 10/04/2019       Pharmacokinetic Considerations and Significant Drug Interactions:  ? Adult (calculated on 10/04/2019): Vd = 29.8 L, ke = 0.25 hr-1  ? Concurrent nephrotoxic meds: Not applicable    Assessment/Plan:  Recommendation(s)  ? Change current regimen to tobramycin 280 mg IV Q12H.  ? Estimated peak and trough on recommended regimen: peak = 10 mg/L, trough = 0.6 mg/L    Follow-up  ? Level due: peak and trough levels around the fourth or fifth dose  ? A pharmacist will continue to monitor and order levels as appropriate    Please page service pharmacist with questions/clarifications.    Latorie Montesano A. Lillar Bianca, Pharm.D., M.S., BCPS, BCSCP  Pharmacist

## 2019-10-05 NOTE — Unmapped (Signed)
PULMONARY ADMISSION  NOTE      Patient: Christian Young(November 22, 1985)  Length of stay:  LOS: 5 days     Assessment and Plan:      Active Problems:    * No active hospital problems. *  Resolved Problems:    * No resolved hospital problems. *    Christian Young is a 34 y.o. 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     1. CF exacerbation w/pulmonary manifestations w/acute exacerbation of bronchiectasis: Persistent shortness of breath and cough. Completed course of Levaquin outpatient but symptoms persisted. Last sputum culture on 03/24/2019 grew 3+ smooth pseudomonas S to tobramycin, I aztreonam and resistant to all else  - CF Sputum Culture growing pseudomonas and MSSA  -??Continue??pip/tazo and IV tobramycin. Will continue antibiotics for two weeks (2/16- 2/30)  -??Continue??home??Symdeko using patient's home supply  - Pulmozyme 2.5 mg BID  - montelukast 10 mg qHS  - cetirizine 10 mg daily  - Symbicort 2 puff BID not on formulary, replaced with nebulized pulmicort and arformoterol??  - HTS 7% QID w/ RT  - Aerobika and chest vest QID w/ RT    2. History of Mycobacterium abscessus infection: Had RUL lobectomy in August of 2018. Previously on clofazimine and azithromycin. Last AFB culture/smear negative 03/25/2019  -Follow up AFB culture & smear    3. CF-related PI  -Continue home creon  -Continue home multivitamin    4. CF-related DM  Last Hemoglobin A1c 8.5 07/2018  -Continue home Lantus 70U (patient stated he took this this morning, so will redose tomorrow morning)  -lispro 30 units TID AC    5. Depression, anxiety:  -Continue home fluoxetine 20mg  daily  -Continue home lamotrigine 200mg  daily  -Continue home amitriptyline 100mg  nightly    6. HTN:  -Continue home lisinopril 10mg  daily    7. History of DVT:  -Continue home xeralto 20mg  daily 8. RLS:  -Continue home pramipexole 0.25mg  daily    FEN/GI/PPX:  - Regular diet  - On xarelto for history of DVT    Please page [phone] with any questions.    [name], MD    Interval History:      Still feeling fatigued with shortness of breath. Also having gastric reflux symptoms       Review of Systems: A comprehensive review of systems was performed and was negative except as above in HPI  Past Medical History:   Diagnosis Date   ??? Anxiety    ??? Chronic pain disorder    ??? Cystic fibrosis (CMS-HCC)    ??? Depression    ??? Hypertension    ??? Nonproductive cough 04/05/2018     Past Surgical History:   Procedure Laterality Date   ??? IR INSERT PORT AGE GREATER THAN 5 YRS  03/27/2019    IR INSERT PORT AGE GREATER THAN 5 YRS 03/27/2019 Rush Barer, MD IMG VIR [facility]   ??? PR REMOVAL OF LUNG,LOBECTOMY Right 03/29/2017    Procedure: REMOVAL OF LUNG, OTHER THAN PNEUMONECTOMY; SINGLE LOBE (LOBECTOMY);  Surgeon: Cherie Dark, MD;  Location: MAIN OR [facility];  Service: Thoracic     Medications reviewed in Epic  Allergies as of 09/29/2019 - Reviewed 09/29/2019   Allergen Reaction Noted   ??? [place] [aztreonam lysine] Anaphylaxis 12/27/2016   ??? Cefepime Itching and Nausea Only 09/06/2015   ???  Other Anaphylaxis and Other (See Comments) 09/06/2015   ??? Slo-bid 100 Anaphylaxis 06/20/2017   ??? Banana Itching and Nausea And Vomiting 07/29/2016   ??? Tobramycin Tinnitus 09/06/2015     Family History   Problem Relation Age of Onset   ??? Bipolar disorder Mother    ??? Depression Mother      Social History     Tobacco Use   ??? Smoking status: Never Smoker   ??? Smokeless tobacco: Never Used   Substance Use Topics   ??? Alcohol use: Yes     Alcohol/week: 3.0 standard drinks     Types: 3 Glasses of wine per week     Comment: 3 bottles of wine 2 days ago        Objective:      Physical Exam:  Vitals:    10/03/19 2344 10/04/19 0736 10/04/19 1124 10/04/19 1606   BP: 119/60 125/65 116/63 123/76   Pulse: 98 84 98 111   Resp: 18 18 18 18    Temp: 36.4 ??C 35.8 ??C 35.9 ??C 36.6 ??C   TempSrc: Temporal Temporal Temporal Temporal   SpO2: 97% 96% 96% 96%   Weight:       Height:         General: Alert, well-appearing, and in no distress.  Eyes: Anicteric sclera, conjunctiva clear.  ENT:  Mucous membranes moist and intact.  Lymph: No cervical or supraclavicular adenopathy.  Lungs: Normal excursion, no dullness to percussion. Good air movement bilaterally, without wheezes or crackles. Normal upper airway sounds without evidence of stridor.  Cardiovascular: Regular rate and rhythm, S1, S2 normal, no murmur, click, rub or gallop appreciated.  Abdomen: Soft, non-tender, not distended, bowel sounds are normal, liver is not enlarged, spleen is not enlarged  Musculoskeletal: No clubbing and no synovitis.  Skin: No rashes or lesions.  Neuro: No focal neurological deficits.       Diagnostic Review:   All labs and images were personally reviewed.

## 2019-10-05 NOTE — Unmapped (Signed)
Problem: Adult Inpatient Plan of Care  Goal: Plan of Care Review  Outcome: Progressing  Goal: Patient-Specific Goal (Individualization)  Outcome: Progressing  Goal: Absence of Hospital-Acquired Illness or Injury  Outcome: Progressing  Goal: Optimal Comfort and Wellbeing  Outcome: Progressing  Goal: Readiness for Transition of Care  Outcome: Progressing  Goal: Rounds/Family Conference  Outcome: Progressing     Problem: Infection  Goal: Infection Symptom Resolution  Outcome: Progressing     Problem: Diabetes Comorbidity  Goal: Blood Glucose Level Within Desired Range  Outcome: Progressing     Problem: Hypertension Comorbidity  Goal: Blood Pressure in Desired Range  Outcome: Progressing     Problem: Wound  Goal: Optimal Wound Healing  Outcome: Progressing  Pt with no acute changes as of this time. VSS and pt currently denies pain. Pt voiding spontaneously and active bowel sounds in all quadrants. Tolerating diet. Neurovascular systems intact with palpable pulses and brisk capillary refill in all extremities. Pain managed with PRN oxycodone x 1. Pt did not verbalize any complaints or concerns at this time and in NAD. Will continue to monitor.

## 2019-10-05 NOTE — Unmapped (Signed)
PULMONARY ADMISSION  NOTE      Patient: Christian Young(05/26/1986)  Length of stay:  LOS: 6 days     Assessment and Plan:      Active Problems:    * No active hospital problems. *  Resolved Problems:    * No resolved hospital problems. *    Christian Young is a 34 y.o. 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     1. CF exacerbation w/pulmonary manifestations w/acute exacerbation of bronchiectasis: Persistent shortness of breath and cough. Completed course of Levaquin outpatient but symptoms persisted. Last sputum culture on 03/24/2019 grew 3+ smooth pseudomonas S to tobramycin, I aztreonam and resistant to all else  - CF Sputum Culture growing pseudomonas and MSSA (new)  -??Continue??pip/tazo and IV tobramycin. Will continue antibiotics for two weeks (2/16- 3/2)  -??Continue??home??Symdeko using patient's home supply  - Pulmozyme 2.5 mg BID  - montelukast 10 mg qHS  - cetirizine 10 mg daily  - Symbicort 2 puff BID not on formulary, replaced with nebulized pulmicort and arformoterol??  - HTS 7% QID w/ RT  - Aerobika and chest vest QID w/ RT  -PFTs this week    2. History of Mycobacterium abscessus infection: Had RUL lobectomy in August of 2018. Previously on clofazimine and azithromycin. Last AFB culture/smear negative 03/25/2019  -Follow up AFB culture (smear negative 09/29/19)    3. CF-related PI  -Continue home creon  -Continue home multivitamin    4. CF-related DM  Last Hemoglobin A1c 8.5 07/2018  -Continue home Lantus 70U  -lispro 30 units TID AC    5. Depression, anxiety:  -Continue home fluoxetine 20mg  daily  -Continue home lamotrigine 200mg  daily  -Continue home amitriptyline 100mg  nightly    6. HTN:  -Continue home lisinopril 10mg  daily    7. History of DVT:  -Continue home xeralto 20mg  daily    8. RLS:  -Continue home pramipexole 0.25mg  daily    FEN/GI/PPX:  - Regular diet  - On xarelto for history of DVT    Please page [phone] with any questions.    Christian Ledger, MD    Interval History:      Still feeling fatigued with shortness of breath, not feeling improved.        Review of Systems: A comprehensive review of systems was performed and was negative except as above in HPI  Past Medical History:   Diagnosis Date   ??? Anxiety    ??? Chronic pain disorder    ??? Cystic fibrosis (CMS-HCC)    ??? Depression    ??? Hypertension    ??? Nonproductive cough 04/05/2018     Past Surgical History:   Procedure Laterality Date   ??? IR INSERT PORT AGE GREATER THAN 5 YRS  03/27/2019    IR INSERT PORT AGE GREATER THAN 5 YRS 03/27/2019 Rush Barer, MD IMG VIR [facility]   ??? PR REMOVAL OF LUNG,LOBECTOMY Right 03/29/2017    Procedure: REMOVAL OF LUNG, OTHER THAN PNEUMONECTOMY; SINGLE LOBE (LOBECTOMY);  Surgeon: Cherie Dark, MD;  Location: MAIN OR [facility];  Service: Thoracic     Medications reviewed in Epic  Allergies as of 09/29/2019 - Reviewed 09/29/2019   Allergen Reaction Noted   ??? [place] [aztreonam lysine] Anaphylaxis 12/27/2016   ??? Cefepime Itching and Nausea Only 09/06/2015   ??? Other Anaphylaxis and Other (  See Comments) 09/06/2015   ??? Slo-bid 100 Anaphylaxis 06/20/2017   ??? Banana Itching and Nausea And Vomiting 07/29/2016   ??? Tobramycin Tinnitus 09/06/2015     Family History   Problem Relation Age of Onset   ??? Bipolar disorder Mother    ??? Depression Mother      Social History     Tobacco Use   ??? Smoking status: Never Smoker   ??? Smokeless tobacco: Never Used   Substance Use Topics   ??? Alcohol use: Yes     Alcohol/week: 3.0 standard drinks     Types: 3 Glasses of wine per week     Comment: 3 bottles of wine 2 days ago        Objective:      Physical Exam:  Vitals:    10/04/19 1606 10/04/19 2042 10/05/19 0800 10/05/19 0926   BP: 123/76  147/63    Pulse: 111 101 103 87   Resp: 18 18 18 18    Temp: 36.6 ??C (97.9 ??F)  36.4 ??C (97.5 ??F)    TempSrc: Temporal  Temporal    SpO2: 96% 100% 95% 96%   Weight:       Height:         General: Alert, well-appearing, and in no distress.  Eyes: Anicteric sclera, conjunctiva clear.  ENT:  Mucous membranes moist and intact.  Lymph: No cervical or supraclavicular adenopathy.  Lungs: Normal excursion, no dullness to percussion. Good air movement bilaterally, without wheezes or crackles. Normal upper airway sounds without evidence of stridor.  Cardiovascular: Regular rate and rhythm, S1, S2 normal, no murmur, click, rub or gallop appreciated.  Abdomen: Soft, non-tender, not distended, bowel sounds are normal, liver is not enlarged, spleen is not enlarged  Musculoskeletal: No clubbing and no synovitis.  Skin: No rashes or lesions.  Neuro: No focal neurological deficits.       Diagnostic Review:   All labs and images were personally reviewed.

## 2019-10-05 NOTE — Unmapped (Signed)
Cystic Fibrosis Nutrition Assessment    Inpatient: MD Consult this admission and related follow up  Primary Pulmonary Provider: [name]  ===================================================================  Christian Young is a 34 y.o. male admitted to [facility] on 2/16 for CF exacerbation  Past Medical History:   Diagnosis Date   ??? Anxiety    ??? Chronic pain disorder    ??? Cystic fibrosis (CMS-HCC)    ??? Depression    ??? Hypertension    ??? Nonproductive cough 04/05/2018     -[name] remains admitted at [facility] for IV abx. On HCHP diet w/ no documented intake since 2/17. Per MD notes, c/o fatigue, SOB and reflux. Protonix started yesterday. [name] reports slight improvement in appetite since admission; now consuming ~50% of large meals and 100% of snacks. Dislikes vanilla carnation & glucerna, will only drink chocolate. Requested fruit cups on meal trays.  ===================================================================  INTERVENTION:    1. Recommend check PT. If elevated start 5mg  phytonadione daily x 5 days, then recheck PT. If WNL start 5mg  phytonadione twice weekly while on IV antibiotics.    2. Please check fat-soluble vitamin labs and CRP prior to discharge    3. Weigh patient twice weekly this admit    4. Continue chocolate Glucerna TID  - will add fruit cups to meal trays per pt request    5. Continue remainder of nutrition regimen:  - HCHP diet  - enzyme regimen; Creon 24,000 x 12 caps/meal, 8 caps/snack  - acid reducer; protonix  - insulin regimen per endocrine  - MVW D5000 BID    Inpatient:   Will follow up with patient per protocol: 1-2 times per week (and more frequent as indicated)  ===================================================================  ASSESSMENT:  Nutrition Category = Adult Class 1 Obesity: BMI 30 to < 35 kg/m2    Current diet is appropriate for CF. Current PO intake is not adequate to meet estimated CF needs. Patient would benefit from PO supplement for additional calories. Patient meeting goals for CF weight management. Enzyme dose is within established guidelines. Vitamin prescription is appropriate to reach/maintain optimal fat soluble vitamin levels. Patient may benefit from vitamin K supplementation while on IV antibiotics. Acid reducer appropriate for GERD and enzyme activation. Insulin regimen is appropriate for carbohydrate intake. Patient on CFTR modulator is consuming adequate amounts of fat-containing foods with prescribed medication to optimize absorption.    ASPEN/AND Malnutrition Screening:  Unable to determine given somewhat unclear wt loss history. Pt at risk for malnutrition given reported prolonged inadequate PO intake.    Goals:  1. Meet estimated daily needs: 3864 kcal/day, 135-180 g PRO/day, 3360 mL fluid/day      Calories estimated using: Cystic Fibrosis Conference Formula, protein per DRI x 1.5-2, fluid per [place] Segar  2. Reach/maintain established goals for CF:                Adult - BMI 22 kg/m2 for CF females and 23 kg/m2 for CF males    Pediatric - BMI or wt/ht ratio > 50%ile for age  50. Normal fat-soluble vitamin levels: Vitamin A, Vitamin E and PT per lab range; Vitamin D 25OH total >30  4. Maintain glucose control. Carbohydrate content of diet should comprise 40-50% of total calorie needs, but carbohydrates are not restricted in this population.    5. Meet sodium needs for CF  ===================================================================  INPATIENT:  [name] is admitted with Cough [R05]  Hyperglycemia [R73.9]  Cystic fibrosis, unspecified (CMS-HCC) [E84.9]  Dyspnea, unspecified type [R06.00].  Current Nutrition Orders (inpatient):       Nutrition Orders   (From admission, onward)             Start     Ordered    09/30/19 1021  Nutrition Therapy High Calorie High Protein; Meal Time Insulin  Effective now     Question Answer Comment   Nutrition Therapy: High Calorie High Protein    Special Services: Meal Time Insulin        09/30/19 1020              CF Nutrition related medications (inpatient): Nutritionally relevant medications reviewed.   MVW D5000 gel cap 2x daily  Insulin regimen  protonix  Creon 24,000 x 12 caps/meal, 8 caps/snack  IV abx  symdeko   tums PRN    CF Nutrition related labs (inpatient): reviewed  ==================================================================  CLINICAL DATA:  Past Medical History:   Diagnosis Date   ??? Anxiety    ??? Chronic pain disorder    ??? Cystic fibrosis (CMS-HCC)    ??? Depression    ??? Hypertension    ??? Nonproductive cough 04/05/2018     Anthroprometric Evaluation:  Weight changes: per EPIC wt hx, pt has experienced ~7.4% wt loss x 6 months  Facility age limit for growth percentiles is 20 years.  BMI Readings from Last 3 Encounters:   09/29/19 33.79 kg/m??   03/24/19 36.49 kg/m??   03/16/19 35.67 kg/m??     Wt Readings from Last 3 Encounters:   09/29/19 (!) 113 kg (249 lb 1.9 oz)   03/24/19 (!) 122 kg (269 lb 1.1 oz)   03/16/19 (!) 119.3 kg (263 lb)     Ht Readings from Last 3 Encounters:   09/29/19 182.9 cm (6')   03/24/19 182.9 cm (6')   03/16/19 182.9 cm (6')     ==================================================================  Energy Intake (outpatient):  Diet: [name] reports poor appetite and PO intake for past several weeks d/t not feeling well. Believes he is eating less than 50% of usual. Unsure of timing of weight loss but suspects majority has occurred over past few weeks with illness. Specifics of intake at home not dicussed as he was not feeling well at time of call. Amenable to trial of PO supplements (CIB no sugar added, Glucerna) while admitted until appetite/intake returns to baseline.    Food allergies:    Allergies   Allergen Reactions   ??? [place] [Aztreonam Lysine] Anaphylaxis   ??? Cefepime Itching and Nausea Only   ??? Other Anaphylaxis and Other (See Comments)     Other reaction(s): Other (See Comments)  Bananas: itchy throat  Slo Bid record from [employer] states anaphylaxis.????Pt states this was from childhood and does not know reaction.  Bananas, causes itchy throat   ??? Slo-Bid 100 Anaphylaxis   ??? Banana Itching and Nausea And Vomiting   ??? Tobramycin Tinnitus     From OSH record-documented as tinnitus but has received IV tobra with close monitoring.      Sodium in diet: likely adequate  Calcium in diet:  likely adequate  CFTR modulator and Diet: Prescribed Symdeko (tezacaftor/ivacaftor).    PO Supplements: none  Patient resources for DME/formula:  -none  Appetite Stimulant: none  Enteral feeding tube: none  Physical activity: not discussed today    Fat Malabsorption (outpatient):  Enzyme brand, (meals/snacks):  Creon 36,000 @ 7/meal or 8/meal and 5/snack or 6/snack  Enzyme administration details: correct pre-meal administration., good compliance at all meals and snacks  Enzyme dose per MEAL (units lipase/kg/meal) 2548  Enzyme dose per DAY (units lipase/kg/day) 12743  GI meds:  Nutritionally relevant medications reviewed.   Stools (steatorrhea): not discussed today  Stools (constipation): not discussed today  GI symptoms:  none reported  Fecal Fat Studies: see below    No results found for: [id number]  Lab Results   Component Value Date    ELAST <15 (L) 03/19/2017     No results found for: PELAI    Vitamins/Minerals (outpatient):  CF-specific MVI, dose, compliance: MVW Complete Formulation Softgel regular, Softgel D-5000 2 daily, good compliance  Other vitamins/minerals/herbals:   - MVW probiotic  Patient Resources for vitamins: [facility], phone [phone]  Calcium supplement: none  Fat-soluble vitamin levels: due for repeat labs  Lab Results   Component Value Date    VITAMINA 44.4 11/19/2016     Lab Results   Component Value Date    CRP 24.9 (H) 08/15/2018    CRP 9.8 08/19/2017    CRP 7.7 11/26/2016     Lab Results   Component Value Date    VITDTOTAL 27.9 03/19/2017     No results found for: VITAME  Lab Results   Component Value Date    PT 18.7 (H) 01/29/2019    PT 17.6 (H) 11/18/2018    PT 14.2 (H) 08/13/2018 PT 12.9 (H) 08/19/2017    PT 11.1 03/29/2017     Lab Results   Component Value Date    DESGCARBPT 0.2 03/20/2017     No results found for: PIVKAII    Bone Health: Last DEXA normal. July 2018. Abnormal Vitamin D as of 2018.  CF Related Diabetes: yes; followed by endocrine    No results found for: GLUF  No results found for: GLUCOSE2HR  Lab Results   Component Value Date    A1C 8.8 (H) 09/29/2019    A1C 8.5 (H) 08/12/2018    A1C 10.7 (H) 04/06/2018     Jackqulyn Livings MPH, RD, LDN  Pager: [phone]

## 2019-10-05 NOTE — Unmapped (Signed)
Patient VSS, afebrile, and pain controlled by PO Oxy IR.  Patient continuing to receive IV abx therapy.  Patient ambulates in room as tolerated.  Patient tolerating PO intake, but has poor appetite at times.  Patient voiding adequately.  Problem: Adult Inpatient Plan of Care  Goal: Plan of Care Review  Outcome: Progressing  Goal: Patient-Specific Goal (Individualization)  Outcome: Progressing  Goal: Absence of Hospital-Acquired Illness or Injury  Outcome: Progressing  Goal: Optimal Comfort and Wellbeing  Outcome: Progressing  Goal: Readiness for Transition of Care  Outcome: Progressing  Goal: Rounds/Family Conference  Outcome: Progressing     Problem: Infection  Goal: Infection Symptom Resolution  Outcome: Progressing     Problem: Diabetes Comorbidity  Goal: Blood Glucose Level Within Desired Range  Outcome: Progressing     Problem: Hypertension Comorbidity  Goal: Blood Pressure in Desired Range  Outcome: Progressing     Problem: Wound  Goal: Optimal Wound Healing  Outcome: Progressing

## 2019-10-06 LAB — TOBRAMYCIN PEAK: Tobramycin^peak:MCnc:Pt:Ser/Plas:Qn:: 6.1

## 2019-10-06 LAB — MEAN CORPUSCULAR VOLUME: Erythrocyte mean corpuscular volume:EntVol:Pt:RBC:Qn:Automated count: 80.4 — ABNORMAL LOW

## 2019-10-06 LAB — CBC
HEMATOCRIT: 36.3 % — ABNORMAL LOW (ref 38.0–50.0)
HEMOGLOBIN: 12.2 g/dL — ABNORMAL LOW (ref 13.5–17.5)
MEAN CORPUSCULAR HEMOGLOBIN CONC: 33.7 g/dL (ref 30.0–36.0)
MEAN CORPUSCULAR HEMOGLOBIN: 27.1 pg (ref 26.0–34.0)
PLATELET COUNT: 323 10*9/L (ref 150–450)
RED BLOOD CELL COUNT: 4.52 10*12/L (ref 4.32–5.72)
RED CELL DISTRIBUTION WIDTH: 15.3 % — ABNORMAL HIGH (ref 12.0–15.0)
WBC ADJUSTED: 8.6 10*9/L (ref 3.5–10.5)

## 2019-10-06 LAB — CO2: Carbon dioxide:SCnc:Pt:Ser/Plas:Qn:: 29

## 2019-10-06 LAB — BASIC METABOLIC PANEL
ANION GAP: 12 mmol/L (ref 7–15)
BLOOD UREA NITROGEN: 15 mg/dL (ref 7–21)
BUN / CREAT RATIO: 17
CALCIUM: 8.9 mg/dL (ref 8.5–10.2)
CHLORIDE: 98 mmol/L (ref 98–107)
EGFR CKD-EPI AA MALE: >90 mL/min/{1.73_m2} (ref >=60)
EGFR CKD-EPI NON-AA MALE: >90 mL/min/{1.73_m2} (ref >=60)
GLUCOSE RANDOM: 168 mg/dL (ref 70–179)
POTASSIUM: 4.7 mmol/L (ref 3.5–5.0)
SODIUM: 139 mmol/L (ref 135–145)

## 2019-10-06 LAB — TOBRAMYCIN RANDOM: Tobramycin:MCnc:Pt:Ser/Plas:Qn:: 1.5

## 2019-10-06 NOTE — Unmapped (Signed)
Patient VSS, afebrile, and pain controlled by PO Oxy IR.  Patient continuing to receive IV abx therapy.  Patient ambulates in room as tolerated.  Patient tolerating PO intake, but has poor appetite at times.  Patient voiding adequately.  Problem: Adult Inpatient Plan of Care  Goal: Plan of Care Review  Outcome: Progressing  Goal: Patient-Specific Goal (Individualization)  Outcome: Progressing  Goal: Absence of Hospital-Acquired Illness or Injury  Outcome: Progressing  Goal: Optimal Comfort and Wellbeing  Outcome: Progressing  Goal: Readiness for Transition of Care  Outcome: Progressing  Goal: Rounds/Family Conference  Outcome: Progressing     Problem: Infection  Goal: Infection Symptom Resolution  Outcome: Progressing     Problem: Diabetes Comorbidity  Goal: Blood Glucose Level Within Desired Range  Outcome: Progressing     Problem: Hypertension Comorbidity  Goal: Blood Pressure in Desired Range  Outcome: Progressing     Problem: Wound  Goal: Optimal Wound Healing  Outcome: Progressing

## 2019-10-06 NOTE — Unmapped (Signed)
VSS. Pt continues on scheduled IV abx. PRN oxy IR given x1 per pt request. Pt has been ambulating independently in room, no reports of any falls/injury, will continue to monitor.   Problem: Diabetes Comorbidity  Goal: Blood Glucose Level Within Desired Range  Outcome: Ongoing - Unchanged     Problem: Hypertension Comorbidity  Goal: Blood Pressure in Desired Range  Outcome: Ongoing - Unchanged     Problem: Wound  Goal: Optimal Wound Healing  Outcome: Ongoing - Unchanged     Problem: Adult Inpatient Plan of Care  Goal: Plan of Care Review  Outcome: Progressing     Problem: Infection  Goal: Infection Symptom Resolution  Outcome: Progressing

## 2019-10-07 LAB — BASIC METABOLIC PANEL
ANION GAP: 9 mmol/L (ref 7–15)
BLOOD UREA NITROGEN: 16 mg/dL (ref 7–21)
BUN / CREAT RATIO: 17
CALCIUM: 8.8 mg/dL (ref 8.5–10.2)
CHLORIDE: 100 mmol/L (ref 98–107)
CREATININE: 0.92 mg/dL (ref 0.70–1.30)
EGFR CKD-EPI AA MALE: >90 mL/min/{1.73_m2} (ref >=60)
GLUCOSE RANDOM: 172 mg/dL (ref 70–179)
POTASSIUM: 4.5 mmol/L (ref 3.5–5.0)
SODIUM: 138 mmol/L (ref 135–145)

## 2019-10-07 LAB — CREATININE: Creatinine:MCnc:Pt:Ser/Plas:Qn:: 0.92

## 2019-10-07 LAB — CBC
HEMATOCRIT: 35.2 % — ABNORMAL LOW (ref 38.0–50.0)
HEMOGLOBIN: 11.7 g/dL — ABNORMAL LOW (ref 13.5–17.5)
MEAN CORPUSCULAR HEMOGLOBIN CONC: 33.2 g/dL (ref 30.0–36.0)
MEAN CORPUSCULAR HEMOGLOBIN: 27 pg (ref 26.0–34.0)
MEAN CORPUSCULAR VOLUME: 81.4 fL (ref 81.0–95.0)
MEAN PLATELET VOLUME: 8.1 fL (ref 7.0–10.0)
PLATELET COUNT: 328 10*9/L (ref 150–450)
RED BLOOD CELL COUNT: 4.33 10*12/L (ref 4.32–5.72)
RED CELL DISTRIBUTION WIDTH: 15.6 % — ABNORMAL HIGH (ref 12.0–15.0)

## 2019-10-07 LAB — MEAN CORPUSCULAR HEMOGLOBIN CONC: Erythrocyte mean corpuscular hemoglobin concentration:MCnc:Pt:RBC:Qn:Automated count: 33.2

## 2019-10-07 NOTE — Unmapped (Signed)
Patient was compliant with all inhaled scheduled medications and airway clearance today.

## 2019-10-07 NOTE — Unmapped (Signed)
Adult [facility] Pharmacist Note     Purpose: Prior Authorization for TRIKAFTA   ?? Christian Young has 2 insurances listed. TRIKAFTA PA was denied on [employer] Young but approved on [employer] Young (likely the secondary).   [facility] Specialty Med Referral: PA Denied Leisure centre manager)  This referral has been DENIED with details below:  ??  Medication (Brand/Generic): Trikafta  Contact Person: [employer]  Denial ID/Case#: Key: [id number] ??? PA Case ID: [id number]  Reason for denial: Current Young approved criteria does not allow coverage  of Trikafta for the treatment of cystic fibrosis unless the patient has at least one copy of  the F508del mutation and the genetic testing results have been submitted.   ??  If you wish to appeal, you have 60 calendar days from this notice to do so.??   Appeal Options:  ?????????????????????? 1. Expedited (72 hours):?? The prescriber or representative can contact the Appeal Department @ #N/A to request appeal via phone.?? Otherwise, please fax letter of appeal marked urgent to #[phone] .??   ?????????????????????? 2. Standard (7 days):???? The prescriber or representative can contact the Appeal Department via fax @ #[phone]  or send letter of appeal to address below:  ?????????????????????????????????????????????? [facility] Specialty Appeals Department                          [address]        October 07, 2019 11:18 AM: Phone call to [facility] Specialty PA Phone Line [phone]  ? CPP called [employer] Specialty PA line and requested to speak with clinical reviewer to provide additional information for PA as the FDA recently approved label expansion for additional responsive rare mutations.   ? Spoke with Christian Young and informed her of updated package insert. However, she states still need to go through appeal process since the clinical criteria has not been updated on their end. She was able to initiate appeal request as urgent/expedited review which determination should occur within 72 hours. ? She also reviewed documentation submitted with original PA and requested more updated chart note as the one submitted was from 2019. She did confirm they have genotype documentation on file.  ?? My contact phone [phone] was also left should there be need for peer-to-peer.    October 07, 2019 11:41 AM: Faxed additional information to [facility] APPEAL FAX [phone]  ?? Marked as urgent, also attached press release 08/03/2019 and page 8 of Trikafta PI (chart of additional responsive mutations).    Total time spent: 20 minutes     Electronically signed:  [name], PharmD, BCPS, CPP  Clinical Pharmacist Practitioner  [facility]    CC:   ? Christian Plan, MD  ? Christian Duel, RN (CF Nurse)  ? Christian Young Forensic psychologist)

## 2019-10-07 NOTE — Unmapped (Signed)
PULMONARY ADMISSION  NOTE      Patient: Christian Young([birth year])  Length of stay:  LOS: 7 days     Assessment and Plan:      Active Problems:    * No active hospital problems. *  Resolved Problems:    * No resolved hospital problems. *    Christian Young is a 34 y.o. 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     1. CF exacerbation w/pulmonary manifestations w/acute exacerbation of bronchiectasis: Persistent shortness of breath and cough. Completed course of Levaquin outpatient but symptoms persisted. Last sputum culture on 03/24/2019 grew 3+ smooth pseudomonas S to tobramycin, I aztreonam and resistant to all else  - CF Sputum Culture growing pseudomonas and MSSA (new)  -??Continue??pip/tazo and IV tobramycin. Will continue antibiotics for two weeks (2/16- 3/2)  -??Continue??home??Symdeko using patient's home supply  - Pulmozyme 2.5 mg BID  - montelukast 10 mg qHS  - cetirizine 10 mg daily  - Symbicort 2 puff BID not on formulary, replaced with nebulized pulmicort and arformoterol??  - HTS 7% QID w/ RT  - Aerobika and chest vest QID w/ RT  -PFTs tomorrow.    2. History of Mycobacterium abscessus infection: Had RUL lobectomy in August of 2018. Previously on clofazimine and azithromycin. Last AFB culture/smear negative 03/25/2019  -Follow up AFB culture (smear negative 09/29/19)    3. CF-related PI  -Continue home creon  -Continue home multivitamin    4. CF-related DM  Last Hemoglobin A1c 8.5 07/2018  -Continue home Lantus 70U  -lispro 30 units TID AC    5. Depression, anxiety:  -Continue home fluoxetine 20mg  daily  -Continue home lamotrigine 200mg  daily  -Continue home amitriptyline 100mg  nightly    6. HTN:  -Continue home lisinopril 10mg  daily    7. History of DVT:  -Continue home xeralto 20mg  daily    8. RLS:  -Continue home pramipexole 0.25mg  daily    FEN/GI/PPX:  - Regular diet  - On xarelto for history of DVT    Please page [phone] with any questions.    Pablo Ledger, MD    Interval History:      Still feeling fatigued with shortness of breath but a little better today.       Review of Systems: A comprehensive review of systems was performed and was negative except as above in HPI  Past Medical History:   Diagnosis Date   ??? Anxiety    ??? Chronic pain disorder    ??? Cystic fibrosis (CMS-HCC)    ??? Depression    ??? Hypertension    ??? Nonproductive cough 04/05/2018     Past Surgical History:   Procedure Laterality Date   ??? IR INSERT PORT AGE GREATER THAN 5 YRS  03/27/2019    IR INSERT PORT AGE GREATER THAN 5 YRS 03/27/2019 Rush Barer, MD IMG VIR [facility]   ??? PR REMOVAL OF LUNG,LOBECTOMY Right 03/29/2017    Procedure: REMOVAL OF LUNG, OTHER THAN PNEUMONECTOMY; SINGLE LOBE (LOBECTOMY);  Surgeon: Cherie Dark, MD;  Location: MAIN OR [facility];  Service: Thoracic     Medications reviewed in Epic  Allergies as of 09/29/2019 - Reviewed 09/29/2019   Allergen Reaction Noted   ??? Cayston [aztreonam lysine] Anaphylaxis 12/27/2016   ??? Cefepime Itching and Nausea Only 09/06/2015   ??? Other Anaphylaxis and Other (  See Comments) 09/06/2015   ??? Slo-bid 100 Anaphylaxis 06/20/2017   ??? Banana Itching and Nausea And Vomiting 07/29/2016   ??? Tobramycin Tinnitus 09/06/2015     Family History   Problem Relation Age of Onset   ??? Bipolar disorder Mother    ??? Depression Mother      Social History     Tobacco Use   ??? Smoking status: Never Smoker   ??? Smokeless tobacco: Never Used   Substance Use Topics   ??? Alcohol use: Yes     Alcohol/week: 3.0 standard drinks     Types: 3 Glasses of wine per week     Comment: 3 bottles of wine 2 days ago        Objective:      Physical Exam:  Vitals:    10/05/19 1200 10/05/19 1600 10/05/19 2048 10/06/19 0823   BP: 132/73 140/77 117/71 109/53   Pulse: 107 105 102 89   Resp: 18 18 18 18    Temp: 36.1 ??C (97 ??F)  36.2 ??C (97.2 ??F) 36.2 ??C (97.2 ??F)   TempSrc: Temporal  Temporal Temporal   SpO2: 97% 97% 98% 96%   Weight:       Height:         General: Alert, well-appearing, and in no distress.  Eyes: Anicteric sclera, conjunctiva clear.  ENT:  Mucous membranes moist and intact.  Lymph: No cervical or supraclavicular adenopathy.  Lungs: Normal excursion, no dullness to percussion. Good air movement bilaterally, without wheezes or crackles. Normal upper airway sounds without evidence of stridor.  Cardiovascular: Regular rate and rhythm, S1, S2 normal, no murmur, click, rub or gallop appreciated.  Abdomen: Soft, non-tender, not distended, bowel sounds are normal, liver is not enlarged, spleen is not enlarged  Musculoskeletal: No clubbing and no synovitis.  Skin: No rashes or lesions.  Neuro: No focal neurological deficits.       Diagnostic Review:   All labs and images were personally reviewed.

## 2019-10-07 NOTE — Unmapped (Signed)
No acute changes noted.       Problem: Adult Inpatient Plan of Care  Goal: Plan of Care Review  Outcome: Ongoing - Unchanged  Goal: Patient-Specific Goal (Individualization)  Outcome: Ongoing - Unchanged  Goal: Absence of Hospital-Acquired Illness or Injury  Outcome: Ongoing - Unchanged  Goal: Optimal Comfort and Wellbeing  Outcome: Ongoing - Unchanged  Goal: Readiness for Transition of Care  Outcome: Ongoing - Unchanged  Goal: Rounds/Family Conference  Outcome: Ongoing - Unchanged     Problem: Infection  Goal: Infection Symptom Resolution  Outcome: Ongoing - Unchanged     Problem: Diabetes Comorbidity  Goal: Blood Glucose Level Within Desired Range  Outcome: Ongoing - Unchanged     Problem: Hypertension Comorbidity  Goal: Blood Pressure in Desired Range  Outcome: Ongoing - Unchanged     Problem: Wound  Goal: Optimal Wound Healing  Outcome: Ongoing - Unchanged

## 2019-10-07 NOTE — Unmapped (Signed)
VSS. All scheduled meds given per MD order. Pt requested PRN oral Oxy IR x1. Pt has been asleep for long intervals this shift. Pt remains on IV ABX. No reports of any falls/injury, will continue to monitor.   Problem: Diabetes Comorbidity  Goal: Blood Glucose Level Within Desired Range  Outcome: Ongoing - Unchanged     Problem: Hypertension Comorbidity  Goal: Blood Pressure in Desired Range  Outcome: Ongoing - Unchanged     Problem: Adult Inpatient Plan of Care  Goal: Plan of Care Review  Outcome: Progressing     Problem: Infection  Goal: Infection Symptom Resolution  Outcome: Progressing     Problem: Wound  Goal: Optimal Wound Healing  Outcome: Progressing

## 2019-10-07 NOTE — Unmapped (Signed)
Aminoglycoside Therapeutic Monitoring Pharmacy Note    Christian Young is a 34 y.o. male continuing tobramycin. Date of therapy initiation: 10/02/19    Indication: CF exacerbation    Prior Dosing Information: Current regimen 280 mg IV q12h infused over 30 minutes    Goals:  Therapeutic Drug Levels  ?? Trough level: tobramycin <1 mg/L  ?? Peak level: tobramycin 10-14 mg/L    Additional Clinical Monitoring/Outcomes  Renal function, volume status (intake and output)    Results:   ?? Random level: 1.5 mg/L, drawn 1741 (Extrapolated trough: 0.6 mg/L)   ?? Peak level: 6.1 mg/L, drawn 1146 (Extrapolated Peak: 9.4 mg/L)    Wt Readings from Last 1 Encounters:   09/29/19 (!) 113 kg (249 lb 1.9 oz)     Lab Results   Component Value Date    CREATININE 0.89 10/06/2019       Pharmacokinetic Considerations and Significant Drug Interactions:  ? Adult (calculated on 2/23): Vd = 29.9 L, ke = 0.237 hr-1  ? Concurrent nephrotoxic meds: Zosyn    Assessment/Plan:  Recommendation(s)  ? Continue current regimen of 280 mg IV q12h infused over 30 minutes  ? Estimated peak and trough on recommended regimen: peak = 9.4 mg/L, trough = 0.6 mg/L   ? Still clearing on q12h. Could consider changing back to q24h if having issues with tinnitus. History of supratherapeutic trough on 260 mg IV q12h in 12/2017, however level at that time could have been sample contamination.    Follow-up  ? Level due: in 3-5 days  ? A pharmacist will continue to monitor and order levels as appropriate    Please page service pharmacist with questions/clarifications.    Colin Mulders, PharmD

## 2019-10-08 LAB — BASIC METABOLIC PANEL
ANION GAP: 11 mmol/L (ref 7–15)
BLOOD UREA NITROGEN: 12 mg/dL (ref 7–21)
BUN / CREAT RATIO: 16
CALCIUM: 8.6 mg/dL (ref 8.5–10.2)
CO2: 25 mmol/L (ref 22.0–30.0)
CREATININE: 0.76 mg/dL (ref 0.70–1.30)
EGFR CKD-EPI AA MALE: >90 mL/min/{1.73_m2} (ref >=60)
EGFR CKD-EPI NON-AA MALE: >90 mL/min/{1.73_m2} (ref >=60)
GLUCOSE RANDOM: 187 mg/dL — ABNORMAL HIGH (ref 70–179)
POTASSIUM: 4.7 mmol/L (ref 3.5–5.0)
SODIUM: 138 mmol/L (ref 135–145)

## 2019-10-08 LAB — CBC
HEMATOCRIT: 35.2 % — ABNORMAL LOW (ref 38.0–50.0)
HEMOGLOBIN: 11.8 g/dL — ABNORMAL LOW (ref 13.5–17.5)
MEAN CORPUSCULAR HEMOGLOBIN: 27.2 pg (ref 26.0–34.0)
MEAN CORPUSCULAR VOLUME: 80.9 fL — ABNORMAL LOW (ref 81.0–95.0)
MEAN PLATELET VOLUME: 8.1 fL (ref 7.0–10.0)
PLATELET COUNT: 313 10*9/L (ref 150–450)
RED BLOOD CELL COUNT: 4.35 10*12/L (ref 4.32–5.72)

## 2019-10-08 LAB — POTASSIUM: Potassium:SCnc:Pt:Ser/Plas:Qn:: 4.7

## 2019-10-08 LAB — MEAN CORPUSCULAR HEMOGLOBIN CONC: Erythrocyte mean corpuscular hemoglobin concentration:MCnc:Pt:RBC:Qn:Automated count: 33.6

## 2019-10-08 NOTE — Unmapped (Signed)
No acute changes noted.       Problem: Adult Inpatient Plan of Care  Goal: Plan of Care Review  Outcome: Ongoing - Unchanged  Goal: Patient-Specific Goal (Individualization)  Outcome: Ongoing - Unchanged  Goal: Absence of Hospital-Acquired Illness or Injury  Outcome: Ongoing - Unchanged  Goal: Optimal Comfort and Wellbeing  Outcome: Ongoing - Unchanged  Goal: Readiness for Transition of Care  Outcome: Ongoing - Unchanged  Goal: Rounds/Family Conference  Outcome: Ongoing - Unchanged     Problem: Infection  Goal: Infection Symptom Resolution  Outcome: Ongoing - Unchanged     Problem: Diabetes Comorbidity  Goal: Blood Glucose Level Within Desired Range  Outcome: Ongoing - Unchanged     Problem: Hypertension Comorbidity  Goal: Blood Pressure in Desired Range  Outcome: Ongoing - Unchanged     Problem: Wound  Goal: Optimal Wound Healing  Outcome: Ongoing - Unchanged

## 2019-10-08 NOTE — Unmapped (Signed)
Patient was compliant with all inhaled scheduled medications and airway clearance today.

## 2019-10-08 NOTE — Unmapped (Signed)
PULMONARY ADMISSION  NOTE      Patient: Christian Young(23-Dec-1985)  Length of stay:  LOS: 8 days     Assessment and Plan:      Active Problems:    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)    Pancreatic insufficiency due to cystic fibrosis (CMS-HCC)    Cystic fibrosis with pulmonary exacerbation (CMS-HCC)  Resolved Problems:    * No resolved hospital problems. *    Christian Young is a 34 y.o. 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     1. CF exacerbation w/pulmonary manifestations w/acute exacerbation of bronchiectasis: Persistent shortness of breath and cough. Completed course of Levaquin outpatient but symptoms persisted. Last sputum culture on 03/24/2019 grew 3+ smooth pseudomonas S to tobramycin, I aztreonam and resistant to all else  - CF Sputum Culture growing pseudomonas and MSSA (new)  -??Continue??pip/tazo and IV tobramycin. Will continue antibiotics for two weeks (2/16- 3/2)  -??Continue??home??Symdeko using patient's home supply  - Pulmozyme 2.5 mg BID  - montelukast 10 mg qHS  - cetirizine 10 mg daily  - Symbicort 2 puff BID not on formulary, replaced with nebulized pulmicort and arformoterol??  - HTS 7% QID w/ RT  - Aerobika and chest vest QID w/ RT  -PFTs today.    2. History of Mycobacterium abscessus infection: Had RUL lobectomy in August of 2018. Previously on clofazimine and azithromycin. Last AFB culture/smear negative 03/25/2019  -Follow up AFB culture (smear negative 09/29/19)    3. CF-related PI  -Continue home creon  -Continue home multivitamin    4. CF-related DM  Last Hemoglobin A1c 8.5 07/2018  -Continue home Lantus 70U  -lispro 30 units TID AC    5. Depression, anxiety:  -Continue home fluoxetine 20mg  daily  -Continue home lamotrigine 200mg  daily  -Continue home amitriptyline 100mg  nightly    6. HTN:  -Continue home lisinopril 10mg  daily    7. History of DVT:  -Continue home xeralto 20mg  daily    8. RLS:  -Continue home pramipexole 0.25mg  daily    FEN/GI/PPX:  - Regular diet  - On xarelto for history of DVT    Please page [phone] with any questions.    Pablo Ledger, MD    Interval History:      Feels improved today, thinks he is on the right track.  PFTs today.       Review of Systems: A comprehensive review of systems was performed and was negative except as above in HPI  Past Medical History:   Diagnosis Date   ??? Anxiety    ??? Chronic pain disorder    ??? Cystic fibrosis (CMS-HCC)    ??? Depression    ??? Hypertension    ??? Nonproductive cough 04/05/2018     Past Surgical History:   Procedure Laterality Date   ??? IR INSERT PORT AGE GREATER THAN 5 YRS  03/27/2019    IR INSERT PORT AGE GREATER THAN 5 YRS 03/27/2019 Rush Barer, MD IMG VIR [facility]   ??? PR REMOVAL OF LUNG,LOBECTOMY Right 03/29/2017    Procedure: REMOVAL OF LUNG, OTHER THAN PNEUMONECTOMY; SINGLE LOBE (LOBECTOMY);  Surgeon: Cherie Dark, MD;  Location: MAIN OR [facility];  Service: Thoracic     Medications reviewed in Epic  Allergies as of 09/29/2019 - Reviewed 09/29/2019   Allergen Reaction Noted   ???  Cayston [aztreonam lysine] Anaphylaxis 12/27/2016   ??? Cefepime Itching and Nausea Only 09/06/2015   ??? Other Anaphylaxis and Other (See Comments) 09/06/2015   ??? Slo-bid 100 Anaphylaxis 06/20/2017   ??? Banana Itching and Nausea And Vomiting 07/29/2016   ??? Tobramycin Tinnitus 09/06/2015     Family History   Problem Relation Age of Onset   ??? Bipolar disorder Mother    ??? Depression Mother      Social History     Tobacco Use   ??? Smoking status: Never Smoker   ??? Smokeless tobacco: Never Used   Substance Use Topics   ??? Alcohol use: Yes     Alcohol/week: 3.0 standard drinks     Types: 3 Glasses of wine per week     Comment: 3 bottles of wine 2 days ago        Objective:      Physical Exam:  Vitals:    10/07/19 0746 10/07/19 1504 10/07/19 1934 10/07/19 2027 BP: 123/70 95/54  118/70   Pulse: 86 101 102 105   Resp: 18 18 18 18    Temp: 36.3 ??C (97.3 ??F) 36.7 ??C (98.1 ??F)     TempSrc: Temporal Temporal     SpO2: 100% 98% 97% 98%   Weight:       Height:         General: Alert, well-appearing, and in no distress.  Eyes: Anicteric sclera, conjunctiva clear.  ENT:  Mucous membranes moist and intact.  Lymph: No cervical or supraclavicular adenopathy.  Lungs: Normal excursion, no dullness to percussion. Good air movement bilaterally, without wheezes or crackles. Normal upper airway sounds without evidence of stridor.  Cardiovascular: Regular rate and rhythm, S1, S2 normal, no murmur, click, rub or gallop appreciated.  Abdomen: Soft, non-tender, not distended, bowel sounds are normal, liver is not enlarged, spleen is not enlarged  Musculoskeletal: No clubbing and no synovitis.  Skin: No rashes or lesions.  Neuro: No focal neurological deficits.       Diagnostic Review:   All labs and images were personally reviewed.

## 2019-10-08 NOTE — Unmapped (Signed)
PULMONARY ADMISSION  NOTE      Patient: Christian Young([birth year])  Length of stay:  LOS: 9 days     Assessment and Plan:      Active Problems:    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)    Pancreatic insufficiency due to cystic fibrosis (CMS-HCC)    Cystic fibrosis with pulmonary exacerbation (CMS-HCC)  Resolved Problems:    * No resolved hospital problems. *    Christian Young is a 34 y.o. 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     1. CF exacerbation w/pulmonary manifestations w/acute exacerbation of bronchiectasis: Persistent shortness of breath and cough. Completed course of Levaquin outpatient but symptoms persisted. Last sputum culture on 03/24/2019 grew 3+ smooth pseudomonas S to tobramycin, I aztreonam and resistant to all else  - CF Sputum Culture growing pseudomonas and MSSA (new)  -??Continue??pip/tazo and IV tobramycin. Will continue antibiotics for two weeks (2/16- 3/2)  -??Continue??home??Symdeko using patient's home supply  - Pulmozyme 2.5 mg BID  - montelukast 10 mg qHS  - cetirizine 10 mg daily  - Symbicort 2 puff BID not on formulary, replaced with nebulized pulmicort and arformoterol??  - HTS 7% QID w/ RT  - Aerobika and chest vest QID w/ RT  -PFTs today.    2. History of Mycobacterium abscessus infection: Had RUL lobectomy in August of 2018. Previously on clofazimine and azithromycin. Last AFB culture/smear negative 03/25/2019  -Follow up AFB culture (smear negative 09/29/19)    3. CF-related PI  -Continue home creon  -Continue home multivitamin    4. CF-related DM  Last Hemoglobin A1c 8.5 07/2018  -Continue home Lantus 70U  -lispro 30 units TID AC    5. Depression, anxiety:  -Continue home fluoxetine 20mg  daily  -Continue home lamotrigine 200mg  daily  -Continue home amitriptyline 100mg  nightly    6. HTN:  -Continue home lisinopril 10mg  daily    7. History of DVT:  -Continue home xeralto 20mg  daily    8. RLS:  -Continue home pramipexole 0.25mg  daily    FEN/GI/PPX:  - Regular diet  - On xarelto for history of DVT    Please page [phone] with any questions.    Pablo Ledger, MD    Interval History:      Still feeling better than prior, doing PFTs today. Plan for discharge March 2.        Review of Systems: A comprehensive review of systems was performed and was negative except as above in HPI  Past Medical History:   Diagnosis Date   ??? Anxiety    ??? Chronic pain disorder    ??? Cystic fibrosis (CMS-HCC)    ??? Depression    ??? Hypertension    ??? Nonproductive cough 04/05/2018     Past Surgical History:   Procedure Laterality Date   ??? IR INSERT PORT AGE GREATER THAN 5 YRS  03/27/2019    IR INSERT PORT AGE GREATER THAN 5 YRS 03/27/2019 Rush Barer, MD IMG VIR [facility]   ??? PR REMOVAL OF LUNG,LOBECTOMY Right 03/29/2017    Procedure: REMOVAL OF LUNG, OTHER THAN PNEUMONECTOMY; SINGLE LOBE (LOBECTOMY);  Surgeon: Cherie Dark, MD;  Location: MAIN OR [facility];  Service: Thoracic     Medications reviewed in Epic  Allergies as of 09/29/2019 - Reviewed 09/29/2019   Allergen Reaction Noted   ???  Cayston [aztreonam lysine] Anaphylaxis 12/27/2016   ??? Cefepime Itching and Nausea Only 09/06/2015   ??? Other Anaphylaxis and Other (See Comments) 09/06/2015   ??? Slo-bid 100 Anaphylaxis 06/20/2017   ??? Banana Itching and Nausea And Vomiting 07/29/2016   ??? Tobramycin Tinnitus 09/06/2015     Family History   Problem Relation Age of Onset   ??? Bipolar disorder Mother    ??? Depression Mother      Social History     Tobacco Use   ??? Smoking status: Never Smoker   ??? Smokeless tobacco: Never Used   Substance Use Topics   ??? Alcohol use: Yes     Alcohol/week: 3.0 standard drinks     Types: 3 Glasses of wine per week     Comment: 3 bottles of wine 2 days ago        Objective:      Physical Exam:  Vitals:    10/07/19 1504 10/07/19 1934 10/07/19 2027 10/08/19 0727   BP: 95/54  118/70 116/54   Pulse: 101 102 105 87   Resp: 18 18 18 18    Temp: 36.7 ??C (98.1 ??F)  36.5 ??C (97.7 ??F) 36.7 ??C (98.1 ??F)   TempSrc: Temporal  Temporal Temporal   SpO2: 98% 97% 98% 97%   Weight:       Height:         General: Alert, well-appearing, and in no distress.  Eyes: Anicteric sclera, conjunctiva clear.  ENT:  Mucous membranes moist and intact.  Lymph: No cervical or supraclavicular adenopathy.  Lungs: Normal excursion, no dullness to percussion. Good air movement bilaterally, without wheezes or crackles. Normal upper airway sounds without evidence of stridor.  Cardiovascular: Regular rate and rhythm, S1, S2 normal, no murmur, click, rub or gallop appreciated.  Abdomen: Soft, non-tender, not distended, bowel sounds are normal, liver is not enlarged, spleen is not enlarged  Musculoskeletal: No clubbing and no synovitis.  Skin: No rashes or lesions.  Neuro: No focal neurological deficits.       Diagnostic Review:   All labs and images were personally reviewed.

## 2019-10-08 NOTE — Unmapped (Signed)
VSS. Pt continues on IV ABX. Oxy IR given for pain management per pt request. All scheduled meds given. No reports of any falls/injury, will continue to monitor.  Problem: Diabetes Comorbidity  Goal: Blood Glucose Level Within Desired Range  Outcome: Ongoing - Unchanged     Problem: Hypertension Comorbidity  Goal: Blood Pressure in Desired Range  Outcome: Ongoing - Unchanged     Problem: Adult Inpatient Plan of Care  Goal: Plan of Care Review  Outcome: Progressing     Problem: Infection  Goal: Infection Symptom Resolution  Outcome: Progressing     Problem: Wound  Goal: Optimal Wound Healing  Outcome: Progressing

## 2019-10-09 MED ORDER — TRAZODONE 100 MG TABLET
ORAL_TABLET | Freq: Every evening | ORAL | 0 refills | 30 days | Status: CP
Start: 2019-10-09 — End: 2019-11-08

## 2019-10-09 MED FILL — TRIKAFTA 100-50-75 MG (D)/150 MG (N) TABLETS: 28 days supply | Qty: 84 | Fill #0 | Status: AC

## 2019-10-09 NOTE — Unmapped (Signed)
[facility]   Patient Onboarding/Medication Counseling    Mr.Johns is a 34 y.o. male with CF who I am counseling today on initiation of therapy.  I am speaking to the patient.    Was a Nurse, learning disability used for this call? No    Verified patient's date of birth / HIPAA.    Specialty medication(s) to be sent: CF/Pulmonary: -Trikafta      Non-specialty medications/supplies to be sent: n/a      Medications not needed at this time: n/a         Trikafta Buyer, retail)    Medication & Administration     How Supplied:   ? Each month supply of Trikafta comes as a 84-count tablet carton that contains 4 weekly wallets, each wallet contains 14 tablets of elexacaftor/tezacaftor/ivacaftor and 7 tablets of ivacaftor)  ? Trikafta is co-packaged as a fixed dose combination tablet of elexacaftor/tezacaftor/ivacaftor and a separate ivacaftor tablet  o 2 orange tablets (marked T100) as the combination of elexacaftor, tezacaftor and ivacaftor.   o The blue tablet (marked V150) contains only ivacaftor.     Dosage: Patients 12 years and older: 2 orange tablets (elexacaftor 100mg /tezacaftor 50mg /ivacaftor 75mg ) and 1 blue tablet (ivacaftor 150mg ) in the evening, 12 hours apart. Taken with fatty food.    Lab tests required prior to treatment initiation:  ? If this is the first time a patient is taking a CFTR Modulator, is there documentation of baseline LFTs? Yes  ? If this is a new start pediatric patient (<18yo), is here documentation of baseline eye exam? N/A    Administration: Take with food that contains high fat. (Examples: eggs, butter, peanut butter, cheese pizza, and whole-milk dairy products.)     Adherence/Missed dose instructions:   ? If AM dose missed (ORANGE tablets):  If less than 6 hours since your usual AM dose: Take missed AM dose as soon as you remember Take PM dose at the usual time   If more than 6 hours since your usual AM dose: Take missed AM dose as soon as you remember SKIP the PM dose that day     ? If you missed the PM dose (BLUE tablets):  If less than 6 hours since your usual PM dose: Take missed PM dose as soon as you remember Take tomorrow's AM dose at the usual time   If more than 6 hours since your usual PM dose: SKIP the PM dose that day Take tomorrow's AM dose at the usual time       Goals of Therapy     To help improve lung function and decrease likelihood of complications and hospitalizations due to Cystic Fibrosis.    Side Effects & Monitoring Parameters     ? Rash   ? Abdominal pain and/or diarrhea  ? Headache, dizziness  ? Runny nose  ? Influenza     The following side effects should be reported to the provider:  ? Signs of liver impairment: Yellowing of the skin or the white part of your eyes (jaundice), nausea or vomiting, diarrhea, or abdominal pain, dark or amber-colored urine, bruising or bleeding, itching, rash, fever    Please note that some patients have experienced a temporary increase in their sputum production shortly after starting TRIKAFTA. This may be a sign that TRIKAFTA is working to help clear your secretions. If you feel sick, short of breath, have a fever, or blood in your sputum, please call the CF Clinic as you normally would.  Monitoring Parameters:  For pediatric patients, up to 32 years of age: Abnormality of the eye lens (cataract) has been noted in some children and adolescents receiving ivacaftor, a component of your medication. Your doctor should perform eye examinations prior to and during treatment with this medication to look for cataracts. Monitor for cataracts and hepatic impairment.    Contraindications, Warnings, & Precautions     ? Hepatic impairment  ? Respiratory events: If you experience severe shortness of breath or chest tightness. Monitor your lung function more closely.     Drug/Food Interactions     ? Medication list reviewed in Epic. The patient was instructed to inform the care team before taking any new medications or supplements. No drug interactions identified.   ? Avoid grapefruit and grapefruit juice and [name] oranges  ? Avoid St John's Wort    Storage, Handling Precautions, & Disposal     ? Store at room temperature         Current Medications (including OTC/herbals), Comorbidities and Allergies     No current facility-administered medications for this visit.      Current Outpatient Medications   Medication Sig Dispense Refill   ??? elexacaftor-tezacaftor-ivacaft (TRIKAFTA) tablet Take 2 Tablets (Elexacaftor 100mg /Tezacaftor 50mg /Ivacaftor 75mg ) by mouth in the morning and 1 tablet (ivacaftor 150mg ) in the evening w/fatty food 84 tablet 3   ??? traZODone (DESYREL) 100 MG tablet Take 1 tablet (100 mg total) by mouth nightly. 30 tablet 0     Facility-Administered Medications Ordered in Other Visits   Medication Dose Route Frequency Provider Last Rate Last Admin   ??? albuterol nebulizer solution 2.5 mg  2.5 mg Nebulization Q6H PRN [name], MD       ??? amitriptyline (ELAVIL) tablet 100 mg  100 mg Oral Nightly [name], MD   100 mg at 10/08/19 2022   ??? arformoterol (BROVANA) nebulizer solution 15 mcg/2 mL  15 mcg Nebulization BID (RT) [name], MD   15 mcg at 10/09/19 2440   ??? budesonide (PULMICORT) nebulizer solution 0.5 mg  0.5 mg Nebulization Daily (RT) [name], MD   0.5 mg at 10/09/19 1027   ??? calcium carbonate (TUMS) chewable tablet 200 mg of elem calcium  200 mg of elem calcium Oral TID PRN [name], MD   200 mg of elem calcium at 10/04/19 1716   ??? cetirizine (ZyrTEC) tablet 10 mg  10 mg Oral Daily [name], MD   10 mg at 10/09/19 0834   ??? dextrose 50 % in water (D50W) 50 % solution 12.5 g  12.5 g Intravenous Q10 Min PRN [name], MD       ??? dornase alfa (PULMOZYME) 1 mg/mL solution 2.5 mg  2.5 mg Inhalation BID (RT) [name], MD   2.5 mg at 10/09/19 2536   ??? FLUoxetine (PROzac) capsule 40 mg  40 mg Oral Daily [name], MD   40 mg at 10/09/19 6440   ??? heparin, porcine (PF) 100 unit/mL injection 500 Units  500 Units Intravenous Q8H PRN [name], MD   500 Units at 09/30/19 0921   ??? influenza vaccine quad (FLUARIX, FLULAVAL, FLUZONE) (6 MOS & UP) 2020-21  0.5 mL Intramuscular During hospitalization Zannie Cove, MD       ??? insulin glargine (LANTUS) injection 70 Units  70 Units Subcutaneous Daily [name], MD   70 Units at 10/02/19 1101   ??? insulin lispro (HumaLOG)  injection 1-20 Units  1-20 Units Subcutaneous TID AC [name], MD   8 Units at 10/09/19 1247   ??? insulin lispro (HumaLOG) injection 30 Units  30 Units Subcutaneous TID AC [name], MD   30 Units at 10/09/19 1248   ??? lamoTRIgine (LaMICtal) tablet 200 mg  200 mg Oral Daily [name], MD   200 mg at 10/09/19 0834   ??? lisinopriL (PRINIVIL,ZESTRIL) tablet 10 mg  10 mg Oral Daily [name], MD   10 mg at 10/09/19 0834   ??? montelukast (SINGULAIR) tablet 10 mg  10 mg Oral Nightly [name], MD   10 mg at 10/08/19 2022   ??? oxyCODONE (ROXICODONE) immediate release tablet 10 mg  10 mg Oral Q6H PRN Zannie Cove, MD   10 mg at 10/09/19 1247   ??? oxyCODONE (ROXICODONE) immediate release tablet 5 mg  5 mg Oral Q6H PRN Zannie Cove, MD   5 mg at 10/02/19 1701   ??? pancrelipase (Lip-Prot-Amyl) (CREON) 24,000-76,000 -120,000 unit delayed release capsule 192,000 units of lipase  8 capsule Oral With snacks [name], MD       ??? pancrelipase (Lip-Prot-Amyl) (CREON) 24,000-76,000 -120,000 unit delayed release capsule 288,000 units of lipase  12 capsule Oral 3xd Meals [name], MD   288,000 units of lipase at 10/09/19 1248   ??? pantoprazole (PROTONIX) EC tablet 20 mg  20 mg Oral Daily [name], MD   20 mg at 10/09/19 1610   ??? pediatric multivitamin-vit D3 5,000 unit-vit K 800 mcg (MVW COMPLETE FORMULATION) capsule  1 capsule Oral BID [name], MD   1 capsule at 10/09/19 0834   ??? piperacillin-tazobactam (ZOSYN) IVPB (premix) 4.5 g  4.5 g Intravenous Q6H [name], MD 200 mL/hr at 10/09/19 1332 4.5 g at 10/09/19 1332   ??? pramipexole (MIRAPEX) tablet 0.25 mg  0.25 mg Oral Daily [name], MD   0.25 mg at 10/08/19 2026   ??? rivaroxaban (XARELTO) tablet 20 mg  20 mg Oral Daily [name], MD   20 mg at 10/08/19 1803   ??? sodium chloride 0.9% (NS) bolus 1,000 mL  1,000 mL Intravenous Once Eldridge Abrahams, MD   Stopped at 09/29/19 1255   ??? sodium chloride 7% nebulizer solution 4 mL  4 mL Nebulization BID (RT) Zannie Cove, MD   4 mL at 10/09/19 9604   ??? tezacaftor 100mg /ivacaftor 150mg  and ivacaftor 150mg  (SYMDEKO) tablets  1 tablet Oral BID [name], MD   1 tablet at 10/09/19 5409   ??? tobramycin (NEBCIN) 280 mg in sodium chloride (NS) 0.9 % 100 mL IVPB  280 mg Intravenous Q12H Zannie Cove, MD   Stopped at 10/09/19 [phone]   ??? traZODone (DESYREL) tablet 100 mg  100 mg Oral Nightly [name], MD   100 mg at 10/08/19 2022       Allergies   Allergen Reactions   ??? [place] [Aztreonam Lysine] Anaphylaxis   ??? Cefepime Itching and Nausea Only   ??? Other Anaphylaxis and Other (See Comments)     Other reaction(s): Other (See Comments)  Bananas: itchy throat  Slo Bid record from [employer] states anaphylaxis.????Pt states this was from childhood and does not know reaction.  Bananas, causes itchy throat   ??? Slo-Bid 100 Anaphylaxis   ??? Banana Itching and Nausea And Vomiting   ??? Tobramycin Tinnitus     From OSH  record-documented as tinnitus but has received IV tobra with close monitoring.       Patient Active Problem List   Diagnosis   ??? Essential hypertension   ??? Depressive disorder   ??? Mood disorder (CMS-HCC)   ??? Anxiety   ??? 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 Chronic pansinusitis   ??? Pancreatic insufficiency due to cystic fibrosis (CMS-HCC)   ??? History of Mycobacterium abscessus infection   ??? Bronchiectasis (CMS-HCC)   ??? Chronic deep vein thrombosis (DVT) of lower extremity (CMS-HCC)   ??? Cystic fibrosis with pulmonary exacerbation (CMS-HCC)   ??? Cystic fibrosis (CMS-HCC)   ??? Obesity (BMI 30-39.9)   ??? Restless leg syndrome       Reviewed and up to date in Epic.    Appropriateness of Therapy     Is medication and dose appropriate based on diagnosis? Yes    Prescription has been clinically reviewed: Yes    Baseline Quality of Life Assessment      How many days over the past month did your CF  keep you from your normal activities? For example, brushing your teeth or getting up in the morning. patient is currently admitted    Financial Information     Medication Assistance provided: Prior Authorization    Anticipated copay of $0 reviewed with patient. Verified delivery address.    Delivery Information     Scheduled delivery date: 2/26    Expected start date: 2/26      Medication will be delivered via Same Day Courier to the temporary address in [place].  This shipment will not require a signature.      Explained the services we provide at [facility] and that each month we would call to set up refills.  Stressed importance of returning phone calls so that we could ensure they receive their medications in time each month.  Informed patient that we should be setting up refills 7-10 days prior to when they will run out of medication.  A pharmacist will reach out to perform a clinical assessment periodically.  Informed patient that a welcome packet and a drug information handout will be sent.      Patient verbalized understanding of the above information as well as how to contact the pharmacy at [phone] option 4 with any questions/concerns.  The pharmacy is open Monday through Friday 8:30am-4:30pm.  A pharmacist is available 24/7 via pager to answer any clinical questions they may have.    Patient Specific Needs     ? Does the patient have any physical, cognitive, or cultural barriers? No    ? Patient prefers to have medications discussed with Patient     ? Is the patient or caregiver able to read and understand education materials at a high school level or above? Yes    ? Patient's primary language is  English     ? Is the patient high risk? No     ? Does the patient require a Care Management Plan? No     ? Does the patient require physician intervention or other additional services (i.e. nutrition, smoking cessation, social work)? No      Julianne Rice  [facility] Specialty Pharmacist

## 2019-10-09 NOTE — Unmapped (Signed)
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 *    Mr. Guitron is a 34 y.o. 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     1. CF exacerbation w/pulmonary manifestations w/acute exacerbation of bronchiectasis: Persistent shortness of breath and cough. Completed course of Levaquin outpatient but symptoms persisted. Last sputum culture on 03/24/2019 grew 3+ smooth pseudomonas S to tobramycin, I aztreonam and resistant to all else  - CF Sputum Culture growing pseudomonas and MSSA (new)  -??Continue??pip/tazo and IV tobramycin. Will continue antibiotics for two weeks (2/16- 3/2)  -??Continue??home??Symdeko using patient's home supply  - Pulmozyme 2.5 mg BID  - montelukast 10 mg qHS  - cetirizine 10 mg daily  - Symbicort 2 puff BID not on formulary, replaced with nebulized pulmicort and arformoterol??  - HTS 7% QID w/ RT  - Aerobika and chest vest QID w/ RT  -PFTs good yesterday.    2. History of Mycobacterium abscessus infection: Had RUL lobectomy in August of 2018. Previously on clofazimine and azithromycin. Last AFB culture/smear negative 03/25/2019  -Follow up AFB culture (smear negative 09/29/19)    3. CF-related PI  -Continue home creon  -Continue home multivitamin    4. CF-related DM  Last Hemoglobin A1c 8.5 07/2018  -Continue home Lantus 70U  -lispro 30 units TID AC    5. Depression, anxiety:  -Continue home fluoxetine 20mg  daily  -Continue home lamotrigine 200mg  daily  -Continue home amitriptyline 100mg  nightly    6. HTN:  -Continue home lisinopril 10mg  daily    7. History of DVT:  -Continue home xeralto 20mg  daily    8. RLS:  -Continue home pramipexole 0.25mg  daily    FEN/GI/PPX:  - Regular diet  - On xarelto for history of DVT    Please page [phone] with any questions.    Pablo Ledger, MD    Interval History:      Still feeling better than prior, PFTs at a year high for him.       Review of Systems: A comprehensive review of systems was performed and was negative except as above in HPI  Past Medical History:   Diagnosis Date   ??? Anxiety    ??? Chronic pain disorder    ??? Cystic fibrosis (CMS-HCC)    ??? Depression    ??? Hypertension    ??? Nonproductive cough 04/05/2018     Past Surgical History:   Procedure Laterality Date   ??? IR INSERT PORT AGE GREATER THAN 5 YRS  03/27/2019    IR INSERT PORT AGE GREATER THAN 5 YRS 03/27/2019 Rush Barer, MD IMG VIR [facility]   ??? PR REMOVAL OF LUNG,LOBECTOMY Right 03/29/2017    Procedure: REMOVAL OF LUNG, OTHER THAN PNEUMONECTOMY; SINGLE LOBE (LOBECTOMY);  Surgeon: Cherie Dark, MD;  Location: [facility]IN [facility];  Service: Thoracic     Medications reviewed in Epic  Allergies as of 09/29/2019 - Reviewed 09/29/2019   Allergen Reaction Noted   ???  [place] [aztreonam lysine] Anaphylaxis 12/27/2016   ??? Cefepime Itching and Nausea Only 09/06/2015   ??? Other Anaphylaxis and Other (See Comments) 09/06/2015   ??? Slo-bid 100 Anaphylaxis 06/20/2017   ??? Banana Itching and Nausea And Vomiting 07/29/2016   ??? Tobramycin Tinnitus 09/06/2015     Family History   Problem Relation Age of Onset   ??? Bipolar disorder Mother    ??? Depression Mother      Social History     Tobacco Use   ??? Smoking status: Never Smoker   ??? Smokeless tobacco: Never Used   Substance Use Topics   ??? Alcohol use: Yes     Alcohol/week: 3.0 standard drinks     Types: 3 Glasses of wine per week     Comment: 3 bottles of wine 2 days ago        Objective:      Physical Exam:  Vitals:    10/08/19 1953 10/08/19 2350 10/09/19 0803 10/09/19 0835   BP:  105/55 115/58    Pulse: 99 90 89 98   Resp: 18 18 18 18    Temp:  36.4 ??C (97.5 ??F)     TempSrc:  Temporal Temporal    SpO2: 99% 95% 98% 99%   Weight:       Height:         General: Alert, well-appearing, and in no distress.  Eyes: Anicteric sclera, conjunctiva clear.  ENT:  Mucous membranes moist and intact.  Lymph: No cervical or supraclavicular adenopathy.  Lungs: Normal excursion, no dullness to percussion. Good air movement bilaterally, without wheezes or crackles. Normal upper airway sounds without evidence of stridor.  Cardiovascular: Regular rate and rhythm, S1, S2 normal, no murmur, click, rub or gallop appreciated.  Abdomen: Soft, non-tender, not distended, bowel sounds are normal, liver is not enlarged, spleen is not enlarged  Musculoskeletal: No clubbing and no synovitis.  Skin: No rashes or lesions.  Neuro: No focal neurological deficits.       Diagnostic Review:   All labs and images were personally reviewed.

## 2019-10-09 NOTE — Unmapped (Signed)
No acute changes noted.       Problem: Adult Inpatient Plan of Care  Goal: Plan of Care Review  Outcome: Ongoing - Unchanged  Goal: Patient-Specific Goal (Individualization)  Outcome: Ongoing - Unchanged  Goal: Absence of Hospital-Acquired Illness or Injury  Outcome: Ongoing - Unchanged  Goal: Optimal Comfort and Wellbeing  Outcome: Ongoing - Unchanged  Goal: Readiness for Transition of Care  Outcome: Ongoing - Unchanged  Goal: Rounds/Family Conference  Outcome: Ongoing - Unchanged     Problem: Infection  Goal: Infection Symptom Resolution  Outcome: Ongoing - Unchanged     Problem: Diabetes Comorbidity  Goal: Blood Glucose Level Within Desired Range  Outcome: Ongoing - Unchanged     Problem: Hypertension Comorbidity  Goal: Blood Pressure in Desired Range  Outcome: Ongoing - Unchanged     Problem: Wound  Goal: Optimal Wound Healing  Outcome: Ongoing - Unchanged

## 2019-10-09 NOTE — Unmapped (Signed)
[name] is a 34 y.o. male who was admitted with an acute exacerbation of chronic bronchiectasis in the setting of cystic fibrosis. He was started on IV antibiotics and aggressive airway clearance.  Sputum grew his usual pseudomonas and new for him MSSA.  He completed two weeks of IV zosyn and tobramycin.  His FEV1 was checked and improved to 68% the week prior to discharge (his best in a year).  He completed 2 weeks total and discharged to home.

## 2019-10-09 NOTE — Unmapped (Signed)
Pt independent on RA. Chest pain managed with prn oxycodone. Tolerating regular diet. IV abx infusing. Safety measures in place. Will continue to monitor.   Problem: Adult Inpatient Plan of Care  Goal: Plan of Care Review  Outcome: Progressing  Goal: Patient-Specific Goal (Individualization)  Outcome: Progressing  Goal: Absence of Hospital-Acquired Illness or Injury  Outcome: Progressing  Goal: Optimal Comfort and Wellbeing  Outcome: Progressing  Goal: Readiness for Transition of Care  Outcome: Progressing  Goal: Rounds/Family Conference  Outcome: Progressing     Problem: Infection  Goal: Infection Symptom Resolution  Outcome: Progressing     Problem: Diabetes Comorbidity  Goal: Blood Glucose Level Within Desired Range  Outcome: Progressing     Problem: Hypertension Comorbidity  Goal: Blood Pressure in Desired Range  Outcome: Progressing     Problem: Wound  Goal: Optimal Wound Healing  Outcome: Progressing

## 2019-10-10 LAB — BASIC METABOLIC PANEL
ANION GAP: 12 mmol/L (ref 7–15)
BLOOD UREA NITROGEN: 13 mg/dL (ref 7–21)
BUN / CREAT RATIO: 15
CALCIUM: 9.1 mg/dL (ref 8.5–10.2)
CHLORIDE: 99 mmol/L (ref 98–107)
CO2: 28 mmol/L (ref 22.0–30.0)
CREATININE: 0.89 mg/dL (ref 0.70–1.30)
EGFR CKD-EPI AA MALE: >90 mL/min/{1.73_m2} (ref >=60)
GLUCOSE RANDOM: 175 mg/dL (ref 70–179)
POTASSIUM: 4.7 mmol/L (ref 3.5–5.0)

## 2019-10-10 LAB — CBC
HEMATOCRIT: 36.9 % — ABNORMAL LOW (ref 38.0–50.0)
HEMOGLOBIN: 12.4 g/dL — ABNORMAL LOW (ref 13.5–17.5)
MEAN CORPUSCULAR HEMOGLOBIN CONC: 33.6 g/dL (ref 30.0–36.0)
MEAN CORPUSCULAR HEMOGLOBIN: 27.4 pg (ref 26.0–34.0)
MEAN CORPUSCULAR VOLUME: 81.7 fL (ref 81.0–95.0)
MEAN PLATELET VOLUME: 8.1 fL (ref 7.0–10.0)
PLATELET COUNT: 341 10*9/L (ref 150–450)
RED CELL DISTRIBUTION WIDTH: 15.7 % — ABNORMAL HIGH (ref 12.0–15.0)
WBC ADJUSTED: 5.6 10*9/L (ref 3.5–10.5)

## 2019-10-10 LAB — BUN / CREAT RATIO: Urea nitrogen/Creatinine:MRto:Pt:Ser/Plas:Qn:: 15

## 2019-10-10 LAB — MEAN CORPUSCULAR VOLUME: Erythrocyte mean corpuscular volume:EntVol:Pt:RBC:Qn:Automated count: 81.7

## 2019-10-10 NOTE — Unmapped (Signed)
Inhaled medication self administered, requested to leave at bedside, patient uses home unit for mucus clearance on his own accord, remains on RA, no complications or distress noted this shift.

## 2019-10-10 NOTE — Unmapped (Signed)
Plan of care reviewed.  Labs and I/O's reviewed.  Pt instructed in use of ICS and coughing and deep breathing.  Call light within reach.  Chart check done.  Pt involved with care.  IV antibiotics given as ordered.  PORT clotted off per patient.  Team notified regarding plan to regain patency of port.  Pt states it clotted off about 3 days after his current admission.  VSS.  RT administering neb treatments.  One time dose of morphine IV for pain. Lungs clear with auscultation.  Accu checks done as ordered, insulin administered.      Problem: Adult Inpatient Plan of Care  Goal: Plan of Care Review  Outcome: Progressing  Goal: Patient-Specific Goal (Individualization)  Outcome: Progressing  Goal: Absence of Hospital-Acquired Illness or Injury  Outcome: Progressing  Goal: Optimal Comfort and Wellbeing  Outcome: Progressing  Goal: Readiness for Transition of Care  Outcome: Progressing  Goal: Rounds/Family Conference  Outcome: Progressing     Problem: Infection  Goal: Infection Symptom Resolution  Outcome: Progressing     Problem: Diabetes Comorbidity  Goal: Blood Glucose Level Within Desired Range  Outcome: Progressing     Problem: Hypertension Comorbidity  Goal: Blood Pressure in Desired Range  Outcome: Progressing     Problem: Wound  Goal: Optimal Wound Healing  Outcome: Progressing

## 2019-10-10 NOTE — Unmapped (Signed)
Pt independent on RA. Chest pain managed with prn oxycodone. Tolerating regular diet. IV abx infusing. Safety measures in place. Will continue to monitor.   Problem: Adult Inpatient Plan of Care  Goal: Plan of Care Review  Outcome: Progressing  Goal: Patient-Specific Goal (Individualization)  Outcome: Progressing  Goal: Absence of Hospital-Acquired Illness or Injury  Outcome: Progressing  Goal: Optimal Comfort and Wellbeing  Outcome: Progressing  Goal: Readiness for Transition of Care  Outcome: Progressing  Goal: Rounds/Family Conference  Outcome: Progressing     Problem: Infection  Goal: Infection Symptom Resolution  Outcome: Progressing     Problem: Diabetes Comorbidity  Goal: Blood Glucose Level Within Desired Range  Outcome: Progressing     Problem: Hypertension Comorbidity  Goal: Blood Pressure in Desired Range  Outcome: Progressing     Problem: Wound  Goal: Optimal Wound Healing  Outcome: Progressing

## 2019-10-10 NOTE — Unmapped (Signed)
PULMONARY ADMISSION  NOTE      Patient: Christian Young(July 02, 1986)  Length of stay:  LOS: 11 days     Assessment and Plan:      Active Problems:    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)    Pancreatic insufficiency due to cystic fibrosis (CMS-HCC)    Cystic fibrosis with pulmonary exacerbation (CMS-HCC)  Resolved Problems:    * No resolved hospital problems. *    Christian Young is a 34 y.o. 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     1. CF exacerbation w/pulmonary manifestations w/acute exacerbation of bronchiectasis: Persistent shortness of breath and cough. Completed course of Levaquin outpatient but symptoms persisted. Last sputum culture on 03/24/2019 grew 3+ smooth pseudomonas S to tobramycin, I aztreonam and resistant to all else  - CF Sputum Culture growing pseudomonas and MSSA (new)  -??Continue??pip/tazo and IV tobramycin. Will continue antibiotics for two weeks (2/16- 3/2)  -??Continue??home??Symdeko using patient's home supply  - Pulmozyme 2.5 mg BID  - montelukast 10 mg qHS  - cetirizine 10 mg daily  - Symbicort 2 puff BID not on formulary, replaced with nebulized pulmicort and arformoterol??  - HTS 7% QID w/ RT  - Aerobika and chest vest QID w/ RT  -PFTs good yesterday.    2. History of Mycobacterium abscessus infection: Had RUL lobectomy in August of 2018. Previously on clofazimine and azithromycin. Last AFB culture/smear negative 03/25/2019  -Follow up AFB culture (smear negative 09/29/19)    3. Access: Difficulty accessing port  -May need new port if unable to access    4. CF-related PI  -Continue home creon  -Continue home multivitamin    5. CF-related DM  Last Hemoglobin A1c 8.5 07/2018  -Continue home Lantus 70U  -lispro 30 units TID AC    6. Depression, anxiety:  -Continue home fluoxetine 20mg  daily -Continue home lamotrigine 200mg  daily  -Continue home amitriptyline 100mg  nightly    7. HTN:  -Continue home lisinopril 10mg  daily    8. History of DVT:  -Continue home xeralto 20mg  daily    9. RLS:  -Continue home pramipexole 0.25mg  daily    FEN/GI/PPX:  - Regular diet  - On xarelto for history of DVT    Please page [phone] with any questions.    [name], MD    Interval History:      Breathing doing well today. Still feeling better than prior, PFTs at a year high for him.       Review of Systems: A comprehensive review of systems was performed and was negative except as above in HPI  Past Medical History:   Diagnosis Date   ??? Anxiety    ??? Chronic pain disorder    ??? Cystic fibrosis (CMS-HCC)    ??? Depression    ??? Hypertension    ??? Nonproductive cough 04/05/2018     Past Surgical History:   Procedure Laterality Date   ??? IR INSERT PORT AGE GREATER THAN 5 YRS  03/27/2019    IR INSERT PORT AGE GREATER THAN 5 YRS 03/27/2019 Rush Barer, MD IMG VIR [facility]   ??? PR REMOVAL OF LUNG,LOBECTOMY Right 03/29/2017    Procedure: REMOVAL OF LUNG, OTHER THAN PNEUMONECTOMY; SINGLE LOBE (LOBECTOMY);  Surgeon: Cherie Dark, MD;  Location: MAIN OR [facility];  Service: Thoracic  Medications reviewed in Epic  Allergies as of 09/29/2019 - Reviewed 09/29/2019   Allergen Reaction Noted   ??? [place] [aztreonam lysine] Anaphylaxis 12/27/2016   ??? Cefepime Itching and Nausea Only 09/06/2015   ??? Other Anaphylaxis and Other (See Comments) 09/06/2015   ??? Slo-bid 100 Anaphylaxis 06/20/2017   ??? Banana Itching and Nausea And Vomiting 07/29/2016   ??? Tobramycin Tinnitus 09/06/2015     Family History   Problem Relation Age of Onset   ??? Bipolar disorder Mother    ??? Depression Mother      Social History     Tobacco Use   ??? Smoking status: Never Smoker   ??? Smokeless tobacco: Never Used   Substance Use Topics   ??? Alcohol use: Yes     Alcohol/week: 3.0 standard drinks     Types: 3 Glasses of wine per week     Comment: 3 bottles of wine 2 days ago        Objective:      Physical Exam:  Vitals:    10/09/19 2300 10/10/19 0400 10/10/19 0823 10/10/19 0900   BP: 134/70 115/59 122/74    Pulse: 88 75 57    Resp: 20 20 20     Temp: 36.7 ??C 35.6 ??C  36.5 ??C   TempSrc: Temporal Temporal  Temporal   SpO2: 97% 97% 97%    Weight:       Height:         General: Alert, well-appearing, and in no distress.  Eyes: Anicteric sclera, conjunctiva clear.  ENT:  Mucous membranes moist and intact.  Lymph: No cervical or supraclavicular adenopathy.  Lungs: Normal excursion, no dullness to percussion. Good air movement bilaterally, without wheezes or crackles. Normal upper airway sounds without evidence of stridor.  Cardiovascular: Regular rate and rhythm, S1, S2 normal, no murmur, click, rub or gallop appreciated.  Abdomen: Soft, non-tender, not distended, bowel sounds are normal, liver is not enlarged, spleen is not enlarged  Musculoskeletal: No clubbing and no synovitis.  Skin: No rashes or lesions.  Neuro: No focal neurological deficits.       Diagnostic Review:   All labs and images were personally reviewed.

## 2019-10-11 LAB — BASIC METABOLIC PANEL
ANION GAP: 12 mmol/L (ref 7–15)
BLOOD UREA NITROGEN: 16 mg/dL (ref 7–21)
BUN / CREAT RATIO: 16
CHLORIDE: 99 mmol/L (ref 98–107)
CO2: 30 mmol/L (ref 22.0–30.0)
CREATININE: 0.99 mg/dL (ref 0.70–1.30)
EGFR CKD-EPI AA MALE: >90 mL/min/{1.73_m2} (ref >=60)
EGFR CKD-EPI NON-AA MALE: >90 mL/min/{1.73_m2} (ref >=60)
GLUCOSE RANDOM: 146 mg/dL (ref 70–179)
POTASSIUM: 4.4 mmol/L (ref 3.5–5.0)
SODIUM: 141 mmol/L (ref 135–145)

## 2019-10-11 LAB — CBC
HEMATOCRIT: 37.1 % — ABNORMAL LOW (ref 38.0–50.0)
HEMOGLOBIN: 12.4 g/dL — ABNORMAL LOW (ref 13.5–17.5)
MEAN CORPUSCULAR HEMOGLOBIN CONC: 33.3 g/dL (ref 30.0–36.0)
MEAN CORPUSCULAR HEMOGLOBIN: 26.9 pg (ref 26.0–34.0)
MEAN CORPUSCULAR VOLUME: 80.6 fL — ABNORMAL LOW (ref 81.0–95.0)
PLATELET COUNT: 325 10*9/L (ref 150–450)
RED BLOOD CELL COUNT: 4.61 10*12/L (ref 4.32–5.72)
RED CELL DISTRIBUTION WIDTH: 15.8 % — ABNORMAL HIGH (ref 12.0–15.0)
WBC ADJUSTED: 6.2 10*9/L (ref 3.5–10.5)

## 2019-10-11 LAB — WBC ADJUSTED: Leukocytes:NCnc:Pt:Bld:Qn:: 6.2

## 2019-10-11 LAB — BUN / CREAT RATIO: Urea nitrogen/Creatinine:MRto:Pt:Ser/Plas:Qn:: 16

## 2019-10-11 NOTE — Unmapped (Signed)
Patient stable on room air. Breathing treatments given as ordered. Home chest vest used for airway clearance

## 2019-10-11 NOTE — Unmapped (Signed)
No acute issues overnight. Contact precautions maintained.   Prn Oxycodone given to help aid in pain relief.     Problem: Adult Inpatient Plan of Care  Goal: Plan of Care Review  Outcome: Progressing  Goal: Patient-Specific Goal (Individualization)  Outcome: Progressing  Goal: Absence of Hospital-Acquired Illness or Injury  Outcome: Progressing  Goal: Optimal Comfort and Wellbeing  Outcome: Progressing  Goal: Readiness for Transition of Care  Outcome: Progressing  Goal: Rounds/Family Conference  Outcome: Progressing

## 2019-10-11 NOTE — Unmapped (Signed)
PULMONARY ADMISSION  NOTE      Patient: Christian Young(12-20-1985)  Length of stay:  LOS: 12 days     Assessment and Plan:      Active Problems:    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)    Pancreatic insufficiency due to cystic fibrosis (CMS-HCC)    Cystic fibrosis with pulmonary exacerbation (CMS-HCC)  Resolved Problems:    * No resolved hospital problems. *    Christian Young is a 34 y.o. 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     1. CF exacerbation w/pulmonary manifestations w/acute exacerbation of bronchiectasis: Persistent shortness of breath and cough. Completed course of Levaquin outpatient but symptoms persisted. Last sputum culture on 03/24/2019 grew 3+ smooth pseudomonas S to tobramycin, I aztreonam and resistant to all else  - CF Sputum Culture growing pseudomonas and MSSA (new)  -??Continue??pip/tazo and IV tobramycin. Will continue antibiotics for two weeks (2/16- 3/2)  -??Continue??home??Symdeko using patient's home supply  - Pulmozyme 2.5 mg BID  - montelukast 10 mg qHS  - cetirizine 10 mg daily  - Symbicort 2 puff BID not on formulary, replaced with nebulized pulmicort and arformoterol??  - HTS 7% QID w/ RT  - Aerobika and chest vest QID w/ RT  -PFTs good yesterday.    2. History of Mycobacterium abscessus infection: Had RUL lobectomy in August of 2018. Previously on clofazimine and azithromycin. Last AFB culture/smear negative 03/25/2019  -Follow up AFB culture (smear negative 09/29/19)    3. Access: Difficulty accessing port  -May need new port if unable to access    4. CF-related PI  -Continue home creon  -Continue home multivitamin    5. CF-related DM  Last Hemoglobin A1c 8.5 07/2018  -Continue home Lantus 70U  -lispro 30 units TID AC    6. Depression, anxiety:  -Continue home fluoxetine 20mg  daily -Continue home lamotrigine 200mg  daily  -Continue home amitriptyline 100mg  nightly    7. HTN:  -Continue home lisinopril 10mg  daily    8. History of DVT:  -Continue home xeralto 20mg  daily    9. RLS:  -Continue home pramipexole 0.25mg  daily    FEN/GI/PPX:  - Regular diet  - On xarelto for history of DVT    Please page [phone] with any questions.    [name], MD    Interval History:      Breathing doing well today and feeling good over all. PFTs at a year high for him.       Review of Systems: A comprehensive review of systems was performed and was negative except as above in HPI  Past Medical History:   Diagnosis Date   ??? Anxiety    ??? Chronic pain disorder    ??? Cystic fibrosis (CMS-HCC)    ??? Depression    ??? Hypertension    ??? Nonproductive cough 04/05/2018     Past Surgical History:   Procedure Laterality Date   ??? IR INSERT PORT AGE GREATER THAN 5 YRS  03/27/2019    IR INSERT PORT AGE GREATER THAN 5 YRS 03/27/2019 Rush Barer, MD IMG VIR [facility]   ??? PR REMOVAL OF LUNG,LOBECTOMY Right 03/29/2017    Procedure: REMOVAL OF LUNG, OTHER THAN PNEUMONECTOMY; SINGLE LOBE (LOBECTOMY);  Surgeon: Cherie Dark, MD;  Location: MAIN OR [facility];  Service: Thoracic  Medications reviewed in Epic  Allergies as of 09/29/2019 - Reviewed 09/29/2019   Allergen Reaction Noted   ??? [place] [aztreonam lysine] Anaphylaxis 12/27/2016   ??? Cefepime Itching and Nausea Only 09/06/2015   ??? Other Anaphylaxis and Other (See Comments) 09/06/2015   ??? Slo-bid 100 Anaphylaxis 06/20/2017   ??? Banana Itching and Nausea And Vomiting 07/29/2016   ??? Tobramycin Tinnitus 09/06/2015     Family History   Problem Relation Age of Onset   ??? Bipolar disorder Mother    ??? Depression Mother      Social History     Tobacco Use   ??? Smoking status: Never Smoker   ??? Smokeless tobacco: Never Used   Substance Use Topics   ??? Alcohol use: Yes     Alcohol/week: 3.0 standard drinks     Types: 3 Glasses of wine per week     Comment: 3 bottles of wine 2 days ago Objective:      Physical Exam:  Vitals:    10/10/19 2050 10/10/19 2346 10/11/19 0800 10/11/19 0931   BP: 129/77 118/69 123/62 123/75   Pulse: 92 92 83    Resp:  16 18    Temp:  36.9 ??C 36.4 ??C    TempSrc:  Temporal Temporal    SpO2: 97% 95% 100%    Weight:       Height:         General: Alert, well-appearing, and in no distress.  Eyes: Anicteric sclera, conjunctiva clear.  ENT:  Mucous membranes moist and intact.  Lymph: No cervical or supraclavicular adenopathy.  Lungs: Normal excursion, no dullness to percussion. Good air movement bilaterally, without wheezes or crackles. Normal upper airway sounds without evidence of stridor.  Cardiovascular: Regular rate and rhythm, S1, S2 normal, no murmur, click, rub or gallop appreciated.  Abdomen: Soft, non-tender, not distended, bowel sounds are normal, liver is not enlarged, spleen is not enlarged  Musculoskeletal: No clubbing and no synovitis.  Skin: No rashes or lesions.  Neuro: No focal neurological deficits.       Diagnostic Review:   All labs and images were personally reviewed.

## 2019-10-12 LAB — CBC
HEMATOCRIT: 37.8 % — ABNORMAL LOW (ref 38.0–50.0)
HEMOGLOBIN: 12.5 g/dL — ABNORMAL LOW (ref 13.5–17.5)
MEAN CORPUSCULAR HEMOGLOBIN CONC: 33 g/dL (ref 30.0–36.0)
MEAN CORPUSCULAR HEMOGLOBIN: 26.6 pg (ref 26.0–34.0)
MEAN CORPUSCULAR VOLUME: 80.6 fL — ABNORMAL LOW (ref 81.0–95.0)
MEAN PLATELET VOLUME: 7.8 fL (ref 7.0–10.0)
RED BLOOD CELL COUNT: 4.69 10*12/L (ref 4.32–5.72)
RED CELL DISTRIBUTION WIDTH: 15.6 % — ABNORMAL HIGH (ref 12.0–15.0)
WBC ADJUSTED: 7.6 10*9/L (ref 3.5–10.5)

## 2019-10-12 LAB — BASIC METABOLIC PANEL
ANION GAP: 12 mmol/L (ref 7–15)
BLOOD UREA NITROGEN: 18 mg/dL (ref 7–21)
BUN / CREAT RATIO: 18
CALCIUM: 9.3 mg/dL (ref 8.5–10.2)
CHLORIDE: 100 mmol/L (ref 98–107)
CO2: 29 mmol/L (ref 22.0–30.0)
CREATININE: 1.01 mg/dL (ref 0.70–1.30)
GLUCOSE RANDOM: 127 mg/dL (ref 70–179)
POTASSIUM: 4.5 mmol/L (ref 3.5–5.0)
SODIUM: 141 mmol/L (ref 135–145)

## 2019-10-12 LAB — PLATELET COUNT: Platelets:NCnc:Pt:Bld:Qn:Automated count: 331

## 2019-10-12 LAB — GLUCOSE RANDOM: Glucose:MCnc:Pt:Ser/Plas:Qn:: 127

## 2019-10-12 MED ORDER — INSULIN ASPART (U-100) 100 UNIT/ML (3 ML) SUBCUTANEOUS PEN
1 refills | 0 days | Status: CP
Start: 2019-10-12 — End: ?

## 2019-10-12 NOTE — Unmapped (Signed)
Cystic Fibrosis Nutrition Assessment    Inpatient: MD Consult this admission and related follow up  Primary Pulmonary Provider: [name]  ===================================================================  Christian Young is a 34 y.o. male admitted to [facility] on 2/16 for CF exacerbation  Past Medical History:   Diagnosis Date   ??? Anxiety    ??? Chronic pain disorder    ??? Cystic fibrosis (CMS-HCC)    ??? Depression    ??? Hypertension    ??? Nonproductive cough 04/05/2018     -Christian Young remains admitted at [facility] for IV abx; plan for d/c tomorrow. On HCHP diet w/ 100% intake of all documented meals. Not relying as heavily on PO supplements but appreciates having them as necessary.  ===================================================================  INTERVENTION:    1. Recommend check PT. If elevated start 5mg  phytonadione daily x 5 days, then recheck PT. If WNL start 5mg  phytonadione twice weekly while on IV antibiotics.    2. Please check fat-soluble vitamin labs and CRP prior to discharge    3. Weigh patient twice weekly this admit    4. Continue chocolate Glucerna TID  - will add fruit cups to meal trays per pt request    5. Continue remainder of nutrition regimen:  - HCHP diet  - enzyme regimen; Creon 24,000 x 12 caps/meal, 8 caps/snack  - acid reducer; protonix  - insulin regimen per endocrine  - MVW D5000 BID    Inpatient:   Will follow up with patient per protocol: 1-2 times per week (and more frequent as indicated)  ===================================================================  ASSESSMENT:  Nutrition Category = Adult Class 1 Obesity: BMI 30 to < 35 kg/m2    Current diet is appropriate for CF. Current PO intake is improving and closer to meeting estimated CF needs. Patient would benefit from PO supplement for additional calories. Patient meeting goals for CF weight management. Enzyme dose is within established guidelines. Patient would benefit from change in vitamin regimen. Patient may benefit from vitamin K supplementation while on IV antibiotics. Acid reducer appropriate for GERD and enzyme activation. Insulin regimen is appropriate for carbohydrate intake. Patient on CFTR modulator is consuming adequate amounts of fat-containing foods with prescribed medication to optimize absorption.    ASPEN/AND Malnutrition Screening:  Unable to determine given somewhat unclear wt loss history. Pt at risk for malnutrition given reported prolonged inadequate PO intake.    Goals:  1. Meet estimated daily needs: 3864 kcal/day, 135-180 g PRO/day, 3360 mL fluid/day      Calories estimated using: Cystic Fibrosis Conference Formula, protein per DRI x 1.5-2, fluid per [place] Segar  2. Reach/maintain established goals for CF:                Adult - BMI 22 kg/m2 for CF females and 23 kg/m2 for CF males    Pediatric - BMI or wt/ht ratio > 50%ile for age  12. Normal fat-soluble vitamin levels: Vitamin A, Vitamin E and PT per lab range; Vitamin D 25OH total >30  4. Maintain glucose control. Carbohydrate content of diet should comprise 40-50% of total calorie needs, but carbohydrates are not restricted in this population.    5. Meet sodium needs for CF  ===================================================================  INPATIENT:  Christian Young is admitted with Cough [R05]  Hyperglycemia [R73.9]  Cystic fibrosis, unspecified (CMS-HCC) [E84.9]  Dyspnea, unspecified type [R06.00].    Current Nutrition Orders (inpatient):       Nutrition Orders   (From admission, onward)  Start     Ordered    09/30/19 1021  Nutrition Therapy High Calorie High Protein; Meal Time Insulin  Effective now     Question Answer Comment   Nutrition Therapy: High Calorie High Protein    Special Services: Meal Time Insulin        09/30/19 1020              CF Nutrition related medications (inpatient): Nutritionally relevant medications reviewed.   MVW D5000 gel cap 2x daily  Insulin regimen  protonix  Creon 24,000 x 12 caps/meal, 8 caps/snack  IV abx symdeko   tums PRN    CF Nutrition related labs (inpatient): reviewed  ==================================================================  CLINICAL DATA:  Past Medical History:   Diagnosis Date   ??? Anxiety    ??? Chronic pain disorder    ??? Cystic fibrosis (CMS-HCC)    ??? Depression    ??? Hypertension    ??? Nonproductive cough 04/05/2018     Anthroprometric Evaluation:  Weight changes: per EPIC wt hx, pt has experienced ~7.4% wt loss x 6 months  Facility age limit for growth percentiles is 20 years.  BMI Readings from Last 3 Encounters:   09/29/19 33.79 kg/m??   03/24/19 36.49 kg/m??   03/16/19 35.67 kg/m??     Wt Readings from Last 3 Encounters:   09/29/19 (!) 113 kg (249 lb 1.9 oz)   03/24/19 (!) 122 kg (269 lb 1.1 oz)   03/16/19 (!) 119.3 kg (263 lb)     Ht Readings from Last 3 Encounters:   09/29/19 182.9 cm (6')   03/24/19 182.9 cm (6')   03/16/19 182.9 cm (6')     ==================================================================  Energy Intake (outpatient):  Diet: Christian Young reports poor appetite and PO intake for past several weeks d/t not feeling well. Believes he is eating less than 50% of usual. Unsure of timing of weight loss but suspects majority has occurred over past few weeks with illness. Specifics of intake at home not dicussed as he was not feeling well at time of call. Amenable to trial of PO supplements (CIB no sugar added, Glucerna) while admitted until appetite/intake returns to baseline.    Food allergies:    Allergies   Allergen Reactions   ??? [place] [Aztreonam Lysine] Anaphylaxis   ??? Cefepime Itching and Nausea Only   ??? Other Anaphylaxis and Other (See Comments)     Other reaction(s): Other (See Comments)  Bananas: itchy throat  Slo Bid record from [employer] states anaphylaxis.????Pt states this was from childhood and does not know reaction.  Bananas, causes itchy throat   ??? Slo-Bid 100 Anaphylaxis   ??? Banana Itching and Nausea And Vomiting   ??? Tobramycin Tinnitus     From OSH record-documented as tinnitus but has received IV tobra with close monitoring.      Sodium in diet: likely adequate  Calcium in diet:  likely adequate  CFTR modulator and Diet: Prescribed Symdeko (tezacaftor/ivacaftor).    PO Supplements: none  Patient resources for DME/formula:  -none  Appetite Stimulant: none  Enteral feeding tube: none  Physical activity: not discussed today    Fat Malabsorption (outpatient):  Enzyme brand, (meals/snacks):  Creon 36,000 @ 7/meal or 8/meal and 5/snack or 6/snack  Enzyme administration details: correct pre-meal administration., good compliance at all meals and snacks  Enzyme dose per MEAL (units lipase/kg/meal) 2548  Enzyme dose per DAY (units lipase/kg/day) 12743  GI meds:  Nutritionally relevant medications reviewed.   Stools (steatorrhea): not discussed  today  Stools (constipation): not discussed today  GI symptoms:  none reported  Fecal Fat Studies: see below    No results found for: [id number]  Lab Results   Component Value Date    ELAST <15 (L) 03/19/2017     No results found for: PELAI    Vitamins/Minerals (outpatient):  CF-specific MVI, dose, compliance: MVW Complete Formulation Softgel regular, Softgel D-5000 2 daily, good compliance  Other vitamins/minerals/herbals:   - MVW probiotic  Patient Resources for vitamins: [facility], phone [phone]  Calcium supplement: none  Fat-soluble vitamin levels: due for repeat labs  Lab Results   Component Value Date    VITAMINA 44.4 11/19/2016     Lab Results   Component Value Date    CRP 24.9 (H) 08/15/2018    CRP 9.8 08/19/2017    CRP 7.7 11/26/2016     Lab Results   Component Value Date    VITDTOTAL 27.9 03/19/2017     No results found for: VITAME  Lab Results   Component Value Date    PT 18.7 (H) 01/29/2019    PT 17.6 (H) 11/18/2018    PT 14.2 (H) 08/13/2018    PT 12.9 (H) 08/19/2017    PT 11.1 03/29/2017     Lab Results   Component Value Date    DESGCARBPT 0.2 03/20/2017     No results found for: PIVKAII    Bone Health: Last DEXA normal. July 2018. Abnormal Vitamin D as of 2018.  CF Related Diabetes: yes; followed by endocrine    No results found for: GLUF  No results found for: GLUCOSE2HR  Lab Results   Component Value Date    A1C 8.8 (H) 09/29/2019    A1C 8.5 (H) 08/12/2018    A1C 10.7 (H) 04/06/2018     Jackqulyn Livings MPH, RD, LDN  Pager: [phone]

## 2019-10-12 NOTE — Unmapped (Signed)
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 *    Christian Young is a 34 y.o. 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     1. CF exacerbation w/pulmonary manifestations w/acute exacerbation of bronchiectasis: Persistent shortness of breath and cough. Completed course of Levaquin outpatient but symptoms persisted. Last sputum culture on 03/24/2019 grew 3+ smooth pseudomonas S to tobramycin, I aztreonam and resistant to all else  - CF Sputum Culture growing pseudomonas and MSSA (new)  -??Continue??pip/tazo and IV tobramycin. Will continue antibiotics for two weeks (2/16- 3/2)  -??Continue??home??Symdeko using patient's home supply  - Pulmozyme 2.5 mg BID  - montelukast 10 mg qHS  - cetirizine 10 mg daily  - Symbicort 2 puff BID not on formulary, replaced with nebulized pulmicort and arformoterol??  - HTS 7% QID w/ RT  - Aerobika and chest vest QID w/ RT  -PFTs 2/25 improved from prior studies    2. History of Mycobacterium abscessus infection: Had RUL lobectomy in August of 2018. Previously on clofazimine and azithromycin. Last AFB culture/smear negative 03/25/2019  -Follow up AFB culture (smear negative 09/29/19)    3. Access: Difficulty accessing port  -May need new port if unable to access     4. CF-related PI  -Continue home creon  -Continue home multivitamin    5. CF-related DM  Last Hemoglobin A1c 8.5 07/2018  -Continue home Lantus 70U  -lispro 30 units TID AC    6. Depression, anxiety:  -Continue home fluoxetine 20mg  daily  -Continue home lamotrigine 200mg  daily  -Continue home amitriptyline 100mg  nightly    7. HTN:  -Continue home lisinopril 10mg  daily    8. History of DVT:  -Continue home xeralto 20mg  daily    9. RLS:  -Continue home pramipexole 0.25mg  daily    FEN/GI/PPX:  - Regular diet  - On xarelto for history of DVT    Please page [phone] with any questions.    Karie Soda, MD    Interval History:      Overall feeling much better, breathing feels good today. Excited to discharge home tomorrow. He and his wife would prefer it if he could leave around lunchtime.        Review of Systems: A comprehensive review of systems was performed and was negative except as above in HPI  Past Medical History:   Diagnosis Date   ??? Anxiety    ??? Chronic pain disorder    ??? Cystic fibrosis (CMS-HCC)    ??? Depression    ??? Hypertension    ??? Nonproductive cough 04/05/2018     Past Surgical History:   Procedure Laterality Date   ??? IR INSERT PORT AGE GREATER THAN 5 YRS  03/27/2019    IR INSERT PORT AGE GREATER THAN 5 YRS 03/27/2019 Rush Barer, MD IMG VIR [facility]   ??? PR REMOVAL OF LUNG,LOBECTOMY Right 03/29/2017    Procedure: REMOVAL OF LUNG, OTHER THAN PNEUMONECTOMY; SINGLE LOBE (LOBECTOMY);  Surgeon:  Cherie Dark, MD;  Location: MAIN [facility];  Service: Thoracic     Medications reviewed in Epic  Allergies as of 09/29/2019 - Reviewed 09/29/2019   Allergen Reaction Noted   ??? [place] [aztreonam lysine] Anaphylaxis 12/27/2016   ??? Cefepime Itching and Nausea Only 09/06/2015   ??? Other Anaphylaxis and Other (See Comments) 09/06/2015   ??? Slo-bid 100 Anaphylaxis 06/20/2017   ??? Banana Itching and Nausea And Vomiting 07/29/2016   ??? Tobramycin Tinnitus 09/06/2015     Family History   Problem Relation Age of Onset   ??? Bipolar disorder Mother    ??? Depression Mother      Social History     Tobacco Use   ??? Smoking status: Never Smoker   ??? Smokeless tobacco: Never Used   Substance Use Topics   ??? Alcohol use: Yes     Alcohol/week: 3.0 standard drinks     Types: 3 Glasses of wine per week     Comment: 3 bottles of wine 2 days ago        Objective:      Physical Exam:  Vitals:    10/11/19 1944 10/12/19 0049 10/12/19 0800 10/12/19 0840   BP:  120/70 120/66    Pulse: 85 95 89 104   Resp: 18 18 18 14    Temp:  36.5 ??C (97.7 ??F) 35.9 ??C (96.7 ??F)    TempSrc:  Temporal Temporal    SpO2: 96% 95% 97% 99%   Weight:       Height:         General: Alert, well-appearing, and in no distress.  Eyes: Anicteric sclera, conjunctiva clear.  ENT:  Mucous membranes moist and intact.  Lymph: No cervical or supraclavicular adenopathy.  Lungs: Normal excursion, no dullness to percussion. Good air movement bilaterally, without wheezes or crackles. Normal upper airway sounds without evidence of stridor.  Cardiovascular: Regular rate and rhythm, S1, S2 normal, no murmur, click, rub or gallop appreciated.  Abdomen: Soft, non-tender, not distended  Musculoskeletal: No clubbing and no synovitis.  Skin: No rashes or lesions.  Neuro: No focal neurological deficits.       Diagnostic Review:   All labs and images were personally reviewed.

## 2019-10-12 NOTE — Unmapped (Signed)
Problem: Adult Inpatient Plan of Care  Goal: Plan of Care Review  Outcome: Progressing  Goal: Patient-Specific Goal (Individualization)  Outcome: Progressing  Goal: Absence of Hospital-Acquired Illness or Injury  Outcome: Progressing  Goal: Optimal Comfort and Wellbeing  Outcome: Progressing  Goal: Readiness for Transition of Care  Outcome: Progressing  Goal: Rounds/Family Conference  Outcome: Progressing     Problem: Infection  Goal: Infection Symptom Resolution  Outcome: Progressing     Problem: Diabetes Comorbidity  Goal: Blood Glucose Level Within Desired Range  Outcome: Progressing     Problem: Hypertension Comorbidity  Goal: Blood Pressure in Desired Range  Outcome: Progressing     Problem: Wound  Goal: Optimal Wound Healing  Outcome: Progressing  Pt with no acute changes as of this time. VSS and pt currently rating pain 5/10. Pt voiding spontaneously and active bowel sounds in all quadrants. Tolerating diet. Neurovascular systems intact with palpable pulses and brisk capillary refill in all extremities. Pain managed with PRN oxycodone. Pt did not verbalize any complaints or concerns at this time and in NAD. Will continue to monitor.

## 2019-10-13 LAB — BASIC METABOLIC PANEL
ANION GAP: 13 mmol/L (ref 7–15)
BLOOD UREA NITROGEN: 15 mg/dL (ref 7–21)
BUN / CREAT RATIO: 15
CALCIUM: 9.3 mg/dL (ref 8.5–10.2)
CO2: 30 mmol/L (ref 22.0–30.0)
CREATININE: 0.98 mg/dL (ref 0.70–1.30)
EGFR CKD-EPI AA MALE: >90 mL/min/{1.73_m2} (ref >=60)
EGFR CKD-EPI NON-AA MALE: >90 mL/min/{1.73_m2} (ref >=60)
GLUCOSE RANDOM: 112 mg/dL (ref 70–179)
POTASSIUM: 4.6 mmol/L (ref 3.5–5.0)
SODIUM: 143 mmol/L (ref 135–145)

## 2019-10-13 LAB — CBC
HEMATOCRIT: 38.4 % (ref 38.0–50.0)
HEMOGLOBIN: 12.7 g/dL — ABNORMAL LOW (ref 13.5–17.5)
MEAN CORPUSCULAR HEMOGLOBIN CONC: 33.1 g/dL (ref 30.0–36.0)
MEAN CORPUSCULAR HEMOGLOBIN: 26.7 pg (ref 26.0–34.0)
MEAN PLATELET VOLUME: 7.9 fL (ref 7.0–10.0)
PLATELET COUNT: 335 10*9/L (ref 150–450)
RED BLOOD CELL COUNT: 4.74 10*12/L (ref 4.32–5.72)
RED CELL DISTRIBUTION WIDTH: 15.8 % — ABNORMAL HIGH (ref 12.0–15.0)
WBC ADJUSTED: 6.2 10*9/L (ref 3.5–10.5)

## 2019-10-13 LAB — MEAN PLATELET VOLUME: Platelet mean volume:EntVol:Pt:Bld:Qn:Automated count: 7.9

## 2019-10-13 LAB — BUN / CREAT RATIO: Urea nitrogen/Creatinine:MRto:Pt:Ser/Plas:Qn:: 15

## 2019-10-13 NOTE — Unmapped (Signed)
A & O x 4. PRN pain medication x 1. Sleeping. IV antibiotics per order.

## 2019-10-13 NOTE — Unmapped (Signed)
Physician Discharge Summary    Admit date: 09/29/2019    Discharge date and time: 10/13/19, 10:30 am    Discharge to: Home    Discharge Service: Pulmonology (MDG)    Discharge Attending Physician: Rayetta Pigg, *    Discharge Diagnoses: CF exacerbation    Procedures: none    Pertinent Test Results: Spirometry 10/08/19       Hospital Course:  [name] is a 34 y.o. male who was admitted with an acute exacerbation of chronic bronchiectasis in the setting of cystic fibrosis. He was started on IV antibiotics and aggressive airway clearance.  Sputum grew his usual pseudomonas and new for him MSSA.  He completed two weeks of IV zosyn and tobramycin.  His FEV1 was checked and improved to 68% the week prior to discharge (his best in a year).  He completed 2 weeks total and discharged to home.        Condition at Discharge: stable  Discharge Medications:      Your Medication List      START taking these medications    TRIKAFTA tablet  Generic drug: elexacaftor-tezacaftor-ivacaft  Take 2 Tablets (Elexacaftor 100mg /Tezacaftor 50mg /Ivacaftor 75mg ) by mouth in the morning and 1 tablet (ivacaftor 150mg ) in the evening w/fatty food        CHANGE how you take these medications    albuterol 90 mcg/actuation inhaler  Commonly known as: PROVENTIL HFA;VENTOLIN HFA  Inhale 2 puffs every six (6) hours as needed.  What changed: Another medication with the same name was changed. Make sure you understand how and when to take each.     albuterol 2.5 mg/0.5 mL nebulizer solution  Inhale 0.5 mL (2.5 mg total) by nebulization every six (6) hours as needed for wheezing.  What changed: when to take this     insulin ASPART 100 unit/mL (3 mL) injection pen  Commonly known as: NovoLOG FLEXPEN  Use up to 150 units/day, divided 3 TIMES DAILY BEFORE MEALS  What changed: additional instructions     insulin glargine 100 unit/mL (3 mL) injection pen  Commonly known as: BASAGLAR, LANTUS  Inject 0.7 mL (70 Units total) under the skin daily.  What changed: when to take this     MVW Complete (pediatric multivit 61-D3-vit K) 1,500-800 unit-mcg Cap  Commonly known as: MVW COMPLETE FORMULATION  Take 2 capsules by mouth daily.  What changed:   ?? how much to take  ?? when to take this     MVW COMPLETE FORMUL PROBIOTIC 40 billion cell -15 mg Cpdr  Generic drug: Lacto-Bif-Sac-Bacil-Strep-bact  Take 1 capsule by mouth daily.  What changed: when to take this        CONTINUE taking these medications    amitriptyline 100 MG tablet  Commonly known as: ELAVIL  Take 100 mg by mouth nightly.     BETHKIS 300 mg/4 mL Nebu  Generic drug: tobramycin  Inhale 1 vial via nebulization every 12 hours for 28 days, alternating every other 28 days     blood-glucose meter kit  Dispense meter that is preferred by patient's insurance company     budesonide-formoteroL 160-4.5 mcg/actuation inhaler  Commonly known as: SYMBICORT  Inhale 2 puffs Two (2) times a day.     cetirizine 10 MG tablet  Commonly known as: ZyrTEC  TAKE 1 TABLET (10 MG TOTAL) BY MOUTH DAILY.     CREON 36,000-114,000- 180,000 unit Cpdr  Generic drug: lipase-protease-amylase  TAKE 7 TO 8 CAPSULES WITH MEALS AND 5  TO 6 CAPSULES WITH SNACKS     DEXCOM G6 RECEIVER Misc  Generic drug: blood-glucose meter,continuous  Use as directed     DEXCOM G6 SENSOR Devi  Generic drug: blood-glucose sensor  Use sq as directed every 14 days     DEXCOM G6 TRANSMITTER Devi  Generic drug: blood-glucose transmitter  Use as directed     dornase alfa 1 mg/mL nebulizer solution  Commonly known as: PULMOZYME  Inhale 2.5 mg Two (2) times a day.     EASY TOUCH LANCING DEVICE Misc  Generic drug: lancing device  Use as directed.     EASY TOUCH TWIST LANCETS 30 gauge Misc  Generic drug: lancets  Use as directed.     fexofenadine 180 MG tablet  Commonly known as: ALLEGRA  Take 1 tablet (180 mg total) by mouth daily.     FLUoxetine 20 MG capsule  Commonly known as: PROzac  Take 40 mg by mouth daily.     fluticasone propionate 50 mcg/actuation nasal spray Commonly known as: FLONASE  USE 1 SPRAY IN EACH NOSTRIL EVERY DAY     gabapentin 600 MG tablet  Commonly known as: NEURONTIN  Take 600 mg by mouth Three (3) times a day as needed.     lamoTRIgine 200 MG tablet  Commonly known as: LaMICtal  Take 200 mg by mouth daily.     LC PLUS Misc  Generic drug: nebulizers  Use as directed with inhaled medications     lisinopriL 10 MG tablet  Commonly known as: PRINIVIL,ZESTRIL  Take 1 tablet (10 mg total) by mouth daily.     melatonin 10 mg Tab  Take 10 mg by mouth nightly.     montelukast 10 mg tablet  Commonly known as: SINGULAIR  Take 1 tablet (10 mg total) by mouth nightly.     omeprazole 20 mg tablet  Take 1 tablet (20 mg total) by mouth daily.     pen needle, diabetic 32 gauge x 5/32 (4 mm) Ndle  Commonly known as: BD ULTRA-FINE NANO PEN NEEDLE  use up to 4 times daily     pramipexole 0.125 MG tablet  Commonly known as: MIRAPEX  Take 0.25 mg by mouth every evening.     sod bicarb-sod chlor-neti pot Pkdv  1 each by sinus irrigation route two (2) times a day.     traZODone 100 MG tablet  Commonly known as: DESYREL  Take 100 mg by mouth nightly.     traZODone 100 MG tablet  Commonly known as: DESYREL  Take 1 tablet (100 mg total) by mouth nightly.     XARELTO 20 mg tablet  Generic drug: rivaroxaban  Take 20 mg by mouth daily.            Pending Test Results:     Pending Labs     Order Current Status    AFB culture Preliminary result          Discharge Instructions:            I spent greater than 30 minutes in the discharge of this patient.

## 2019-10-13 NOTE — Unmapped (Signed)
Pt understands care plan including completion of IV ABX tomorrow. Pt hoping IV remains functional through tomorrow so he won't have to have another one placed. Provided tylenol for HA 4/10. Apple juice provided for low BG of 65. Meal ordered and on the way. Will continue to monitor.

## 2019-10-13 NOTE — Unmapped (Addendum)
Patient self administers inhaled medications without RT assistance, requested to leave at bedside, patient uses home chest vest independently. No complications or distress noted this shift.

## 2019-10-15 MED ORDER — AMITRIPTYLINE 100 MG TABLET
ORAL_TABLET | 1 refills | 0 days | Status: CP
Start: 2019-10-15 — End: ?

## 2019-10-15 NOTE — Unmapped (Signed)
Christian Young left a message with the CF nurse coordinator office. Requesting a refill of his amitriptyline (elavil). Medication pended and routed to Dr [name] to either approve or decline.    [name], RN  CF Nurse Coordinator   [phone]

## 2019-10-16 NOTE — Unmapped (Signed)
 Healthcare  Adult Cystic Fibrosis Clinic    As part of [facility] CF Grocery Assistance Program, Admin Coordinator mailed patient [phone] gift card for food due to financial difficulties and associated food insecurity on 10/15/19.

## 2019-10-30 MED FILL — TRIKAFTA 100-50-75 MG (D)/150 MG (N) TABLETS: 28 days supply | Qty: 84 | Fill #1 | Status: AC

## 2019-10-30 MED FILL — TRIKAFTA 100-50-75 MG (D)/150 MG (N) TABLETS: 28 days supply | Qty: 84 | Fill #1

## 2019-10-30 NOTE — Unmapped (Signed)
[facility] Clinical Assessment & Refill Coordination Note    Christian Young, [place]: 1986-05-10  Phone: [phone] (home)     All above HIPAA information was verified with patient.     Was a Nurse, learning disability used for this call? No    Specialty Medication(s):   CF/Pulmonary: -Trikafta     Current Outpatient Medications   Medication Sig Dispense Refill   ??? albuterol (PROVENTIL HFA;VENTOLIN HFA) 90 mcg/actuation inhaler Inhale 2 puffs every six (6) hours as needed. 1 Inhaler 1   ??? albuterol 2.5 mg/0.5 mL nebulizer solution Inhale 0.5 mL (2.5 mg total) by nebulization every six (6) hours as needed for wheezing. (Patient taking differently: Inhale 2.5 mg by nebulization Two (2) times a day. ) 90 each 3   ??? amitriptyline (ELAVIL) 100 MG tablet TAKE ONE TABLET BY MOUTH AT BEDTIME 30 tablet 1   ??? BETHKIS 300 mg/4 mL Nebu Inhale 1 vial via nebulization every 12 hours for 28 days, alternating every other 28 days 224 mL 6   ??? blood-glucose meter kit Dispense meter that is preferred by patient's insurance company 1 each 0   ??? blood-glucose meter,continuous (DEXCOM G6 RECEIVER) Misc Use as directed 1 each 0   ??? blood-glucose sensor (DEXCOM G6 SENSOR) Devi Use sq as directed every 14 days 6 Device 11   ??? blood-glucose transmitter (DEXCOM G6 TRANSMITTER) Devi Use as directed 1 Device 11   ??? budesonide-formoteroL (SYMBICORT) 160-4.5 mcg/actuation inhaler Inhale 2 puffs Two (2) times a day. 3 Inhaler 3   ??? cetirizine (ZYRTEC) 10 MG tablet TAKE 1 TABLET (10 MG TOTAL) BY MOUTH DAILY. 90 tablet 3   ??? CREON 36,000-114,000- 180,000 unit CpDR TAKE 7 TO 8 CAPSULES WITH MEALS AND 5 TO 6 CAPSULES WITH SNACKS 1200 capsule 11   ??? dornase alfa (PULMOZYME) 1 mg/mL nebulizer solution Inhale 2.5 mg Two (2) times a day. 450 mL 3   ??? EASY TOUCH LANCING DEVICE Misc Use as directed.     ??? EASY TOUCH TWIST LANCETS 30 gauge Misc Use as directed.     ??? elexacaftor-tezacaftor-ivacaft (TRIKAFTA) tablet Take 2 Tablets (Elexacaftor 100mg /Tezacaftor 50mg /Ivacaftor 75mg ) by mouth in the morning and 1 tablet (ivacaftor 150mg ) in the evening w/fatty food 84 tablet 3   ??? fexofenadine (ALLEGRA) 180 MG tablet Take 1 tablet (180 mg total) by mouth daily. 30 tablet 0   ??? FLUoxetine (PROZAC) 20 MG capsule Take 40 mg by mouth daily.     ??? fluticasone propionate (FLONASE) 50 mcg/actuation nasal spray USE 1 SPRAY IN EACH NOSTRIL EVERY DAY 16 g 11   ??? gabapentin (NEURONTIN) 600 MG tablet Take 600 mg by mouth Three (3) times a day as needed.     ??? insulin ASPART (NOVOLOG FLEXPEN) 100 unit/mL (3 mL) injection pen Use up to 150 units/day, divided 3 TIMES DAILY BEFORE MEALS 135 mL 1   ??? insulin glargine (BASAGLAR, LANTUS) 100 unit/mL (3 mL) injection pen Inject 0.7 mL (70 Units total) under the skin daily. (Patient taking differently: Inject 70 Units under the skin nightly. ) 30 mL 0   ??? lamoTRIgine (LAMICTAL) 200 MG tablet Take 200 mg by mouth daily.     ??? lisinopriL (PRINIVIL,ZESTRIL) 10 MG tablet Take 1 tablet (10 mg total) by mouth daily. 30 tablet 0   ??? melatonin 10 mg Tab Take 10 mg by mouth nightly.     ??? montelukast (SINGULAIR) 10 mg tablet Take 1 tablet (10 mg total) by mouth  nightly. 90 tablet 3   ??? MVW COMPLETE FORMUL PROBIOTIC 40 billion cell -15 mg CpDR Take 1 capsule by mouth daily. (Patient taking differently: Take 1 capsule by mouth Two (2) times a day. ) 90 capsule 3   ??? MVW Complete, pediatric multivit 61-D3-vit K, 1,500-800 unit-mcg cap Take 2 capsules by mouth daily. (Patient taking differently: Take 1 capsule by mouth Two (2) times a day. ) 60 capsule 11   ??? nebulizers Misc Use as directed with inhaled medications 4 each 3   ??? omeprazole 20 mg tablet Take 1 tablet (20 mg total) by mouth daily. 90 tablet 3   ??? pen needle, diabetic (BD ULTRA-FINE NANO PEN NEEDLE) 32 gauge x 5/32 (4 mm) Ndle use up to 4 times daily 400 each 12   ??? pramipexole (MIRAPEX) 0.125 MG tablet Take 0.25 mg by mouth every evening.      ??? sod bicarb-sod chlor-neti pot pkdv 1 each by sinus irrigation route two (2) times a day. 50 each 11   ??? traZODone (DESYREL) 100 MG tablet Take 100 mg by mouth nightly.     ??? traZODone (DESYREL) 100 MG tablet Take 1 tablet (100 mg total) by mouth nightly. 30 tablet 0   ??? XARELTO 20 mg tablet Take 20 mg by mouth daily.       No current facility-administered medications for this visit.         Changes to medications: [name] reports no changes at this time.    Allergies   Allergen Reactions   ??? [place] [Aztreonam Lysine] Anaphylaxis   ??? Cefepime Itching and Nausea Only   ??? Other Anaphylaxis and Other (See Comments)     Other reaction(s): Other (See Comments)  Bananas: itchy throat  Slo Bid record from [employer] states anaphylaxis.????Pt states this was from childhood and does not know reaction.  Bananas, causes itchy throat   ??? Slo-Bid 100 Anaphylaxis   ??? Banana Itching and Nausea And Vomiting   ??? Tobramycin Tinnitus     From OSH record-documented as tinnitus but has received IV tobra with close monitoring.       Changes to allergies: No    SPECIALTY MEDICATION ADHERENCE     Trikafta  .: 7 days of medicine on hand     Medication Adherence    Patient reported X missed doses in the last month: 0  Specialty Medication: Trikafta  Patient is on additional specialty medications: No  Informant: patient  Support network for adherence: family member          Specialty medication(s) dose(s) confirmed: Regimen is correct and unchanged.     Are there any concerns with adherence? No    Adherence counseling provided? Not needed    CLINICAL MANAGEMENT AND INTERVENTION      Clinical Benefit Assessment:    Do you feel the medicine is effective or helping your condition? Yes    Clinical Benefit counseling provided? Not needed    Adverse Effects Assessment:    Are you experiencing any side effects? No    Are you experiencing difficulty administering your medicine? No    Quality of Life Assessment:    How many days over the past month did your Cf  keep you from your normal activities? For example, brushing your teeth or getting up in the morning. 0    Have you discussed this with your provider? Not needed    Therapy Appropriateness:    Is therapy appropriate? Yes, therapy is appropriate  and should be continued    DISEASE/MEDICATION-SPECIFIC INFORMATION      For CF patients: CF [facility] Active? Yes    PATIENT SPECIFIC NEEDS     ? Does the patient have any physical, cognitive, or cultural barriers? No    ? Is the patient high risk? No     ? Does the patient require a Care Management Plan? No     ? Does the patient require physician intervention or other additional services (i.e. nutrition, smoking cessation, social work)? No      SHIPPING     Specialty Medication(s) to be Shipped:   CF/Pulmonary: -Trikafta    Other medication(s) to be shipped: n/a     Changes to insurance: No    Delivery Scheduled: Yes, Expected medication delivery date: 3/22.     Medication will be delivered via UPS to the confirmed prescription address in [facility].    The patient will receive a drug information handout for each medication shipped and additional FDA Medication Guides as required.  Verified that patient has previously received a Conservation officer, historic buildings.    All of the patient's questions and concerns have been addressed.    Julianne Rice   [facility] Specialty Pharmacist

## 2019-11-09 NOTE — Unmapped (Signed)
Patient did not show up for his scheduled clinic visit. Left voicemail about rescheduling options. MyChart message sent as well with guidance on rescheduling.    [name], RN  CF Nurse Coordinator   [phone]

## 2019-11-13 NOTE — Unmapped (Signed)
[facility]  Adult Cystic Fibrosis Clinic    As part of [facility] CF Grocery Assistance Program, Admin Coordinator mailed patient [phone] gift card for food due to financial difficulties and associated food insecurity on 11/12/19.

## 2019-11-27 NOTE — Unmapped (Signed)
The [facility] has made a third and final attempt to reach this patient to refill the following medication:  Trikafta.      We have left voicemails on the following phone numbers: [phone] (H).    Dates contacted: 04/07, 04/12 & 11/27/2019    Last scheduled delivery: 10/30/2019    The patient may be at risk of non-compliance with this medication. The patient should call the [facility] at [phone] (option 4) to refill medication.    Russ Looper Leodis Binet   [facility] Specialty Technician

## 2019-12-10 MED ORDER — AMITRIPTYLINE 100 MG TABLET
ORAL_TABLET | 1 refills | 0 days
Start: 2019-12-10 — End: ?

## 2019-12-10 NOTE — Unmapped (Signed)
[facility]  Adult Cystic Fibrosis Clinic    As part of [facility] CF Grocery Assistance Program, Admin Coordinator mailed patient [phone] gift card for food due to financial difficulties and associated food insecurity on 12/10/19.

## 2019-12-21 MED ORDER — AMITRIPTYLINE 100 MG TABLET
ORAL_TABLET | 1 refills | 0.00000 days
Start: 2019-12-21 — End: ?

## 2019-12-21 MED ORDER — ALBUTEROL SULFATE CONCENTRATE 2.5 MG/0.5 ML SOLUTION FOR NEBULIZATION
Freq: Two times a day (BID) | RESPIRATORY_TRACT | 3 refills | 90 days | Status: CP
Start: 2019-12-21 — End: 2020-12-20

## 2019-12-21 MED ORDER — TRAZODONE 100 MG TABLET
ORAL_TABLET | 0 refills | 0.00000 days
Start: 2019-12-21 — End: ?

## 2019-12-21 MED FILL — TRIKAFTA 100-50-75 MG (D)/150 MG (N) TABLETS: 28 days supply | Qty: 84 | Fill #2

## 2019-12-21 MED FILL — TRIKAFTA 100-50-75 MG (D)/150 MG (N) TABLETS: 28 days supply | Qty: 84 | Fill #2 | Status: AC

## 2019-12-21 NOTE — Unmapped (Signed)
Received refill requests for patient's amitriptyline and trazodone. Per notes, patient were previously prescribed by another provider. Declined refills and will encourage patient to reach out to previous prescribing provider.

## 2019-12-21 NOTE — Unmapped (Signed)
[facility] Refill Coordination Note    Specialty Medication(s) to be Shipped:   CF/Pulmonary: -Trikafta    Other medication(s) to be shipped: N/A     Christian Young, DOB: May 20, 1986  Phone: [phone] (home)       All above HIPAA information was verified with patient.     Was a Nurse, learning disability used for this call? No    Completed refill call assessment today to schedule patient's medication shipment from the [facility] [phone]).       Specialty medication(s) and dose(s) confirmed: Regimen is correct and unchanged.   Changes to medications: Meldon reports no changes at this time.  Changes to insurance: No  Questions for the pharmacist: Yes: About his medication and missed doses    Confirmed patient received Welcome Packet with first shipment. The patient will receive a drug information handout for each medication shipped and additional FDA Medication Guides as required.       DISEASE/MEDICATION-SPECIFIC INFORMATION        N/A    SPECIALTY MEDICATION ADHERENCE     Medication Adherence    Patient reported X missed doses in the last month: 3  Specialty Medication: Trikafta   Patient is on additional specialty medications: No  Support network for adherence: family member                Trikafta    0 days of medicine on hand         SHIPPING     Shipping address confirmed in Epic.     Delivery Scheduled: Yes, Expected medication delivery date: 12/22/19.     Medication will be delivered via UPS to the prescription address in Epic WAM.    [facility] Specialty Technician

## 2019-12-21 NOTE — Unmapped (Signed)
[facility] Pharmacist Intervention    Type of intervention: Medication Adherance     Medication: Trikafta    Problem: Christian Young has missed 4-5 days of Trikafta and is wondering what symptoms to look out for.  He states he has been busy at work and just dropped the ball on contacting us for refills. Christian Young is feeling a few more stomach pains and a little more SOB, but it is not too bad to affect his day.    Intervention: Instructed Christian Young to be diligent about his inhaled therapies, airway clearance, enzymes and support meds while off Trikafta.  Advised him to reach out to clinic for any symptoms that seem to be getting worse.  We will ship Trikafta today to arrive to him tomorrow 5/11 via UPS and instructed him he can fit both doses in that day as long as he can separate doses by 6 hours.    For future refills, I advised Christian Young we will reach out via MyChart to coordinate refills.  I let him know he must answer all the questions for [name] to send out refills.  If I need to speak to him for a clinical assessment, I will send him a MyChart message asking him to call the pharmacy.       Approximate time spent: 10 minutes    Julianne Rice   [facility] Specialty Pharmacist

## 2020-01-04 ENCOUNTER — Ambulatory Visit: Admit: 2020-01-04 | Discharge: 2020-01-05 | Payer: PRIVATE HEALTH INSURANCE

## 2020-01-04 ENCOUNTER — Ambulatory Visit
Admit: 2020-01-04 | Discharge: 2020-01-05 | Payer: PRIVATE HEALTH INSURANCE | Attending: Pharmacist | Primary: Pharmacist

## 2020-01-04 DIAGNOSIS — E089 Diabetes mellitus due to underlying condition without complications: Secondary | ICD-10-CM

## 2020-01-04 DIAGNOSIS — T148XXA Other injury of unspecified body region, initial encounter: Principal | ICD-10-CM

## 2020-01-04 DIAGNOSIS — K8689 Other specified diseases of pancreas: Principal | ICD-10-CM

## 2020-01-04 DIAGNOSIS — K219 Gastro-esophageal reflux disease without esophagitis: Principal | ICD-10-CM

## 2020-01-04 MED ORDER — BLOOD GLUCOSE TEST STRIPS
ORAL_STRIP | Freq: Once | 1 refills | 0 days | Status: CP
Start: 2020-01-04 — End: 2020-01-04

## 2020-01-04 MED ORDER — BLOOD-GLUCOSE METER KIT WRAPPER
0 refills | 0 days | Status: CP
Start: 2020-01-04 — End: 2021-01-03

## 2020-01-04 MED ORDER — MUPIROCIN 2 % TOPICAL OINTMENT
0 refills | 0 days | Status: CP
Start: 2020-01-04 — End: ?

## 2020-01-04 MED ORDER — FAMOTIDINE 20 MG TABLET
ORAL_TABLET | Freq: Every day | ORAL | 5 refills | 30.00000 days | Status: CP
Start: 2020-01-04 — End: 2021-01-03

## 2020-01-04 MED ORDER — SULFAMETHOXAZOLE 800 MG-TRIMETHOPRIM 160 MG TABLET
ORAL_TABLET | Freq: Two times a day (BID) | ORAL | 0 refills | 14.00000 days | Status: CP
Start: 2020-01-04 — End: 2020-01-18

## 2020-01-04 NOTE — Unmapped (Signed)
Goals and plans we discussed today:  1. Take Bactrim 2 tablets twice daily for 14 days  2. Apply Mupirocin ointment inside of both nares twice daily for 14 days  3. Wipe down your surfaces at home with disinfectant wipes daily for 21 days  4. Wash your towels and sheets in hot water weekly for 21 days  5. Follow up with Dermatology if it doesn't improve  6. Call Endocrinology for an appointment and for a new glucometer and supplies     Thank you for allowing [name] to be a part of your care!     To reach your CF nurse coordinators:    Patients with the last name A-K: Joni Reining [phone]  Patients with the last name L-Z: Harriett Sine [phone]     For urgent issues after hours/weekends:  Hospital Operator: [phone]) [phone], ask for Pulmonary Fellow on-call     To make or change a clinic appointment:   [facility]: [phone]     --> When you should use MyChart:           - Order a prescription refill          - View test results          - Send a non-urgent message or update to the care team          - View after-visit summaries           - See or pay bills      --> When you should call (NOT use MyChart)           - Increase in cough          - Change in amount of mucus or mucus color           - Coughing up blood or blood-tinged mucus          - Chest pain          - Shortness of breath           - Lack of energy, feeling sick, or increase in tiredness     --> I don't have a MyChart. Why should I get one?           - It's encrypted, so your information is secure          - It's a quick, easy way to contact the care team, manage appointments, see test results, and more!      --> How do I sign-up for MyChart?            - Download the MyChart app from the [employer] or [employer] and sign-up in the app           - Sign-up online at [email or web address]

## 2020-01-04 NOTE — Unmapped (Signed)
Pulmonary Clinic - Follow-up Visit    HISTORY:     Interval History:  Diagnosed at 50 with chronic infections, had consolidation and parapneumonic effusion,sent to [facility] for treatment since then.   [id number] and [id number] insertion. 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    Christian Young was last admitted from 2/16 - 10/13/19 for a CF pulmonary exacerbation. He was treated with IV Tobramycin and IV Zosyn x 14 days. His FEV1 was 68% prior to discharge. He switched from [facility] to [place] after going home. He reports feeling significantly improved since then. He has minimal cough or sputum. He denies chest pain, wheezing or shortness of breath. He is doing his treatments twice daily at home and rotating Tobi nebs every other month.     His biggest concern today is having a skin infection that started 6 days ago. It began inside his nose and has now spread to the outside of his nose and on his face. It is also in his left ear and in both arm pits. He denies fevers or chills. He reports having yellow drainage from his nose, but no sinus pressure or congestion. He has a history of cold sores, but this feels different. The areas are painful especially when he tries to eat. He had cellulitis once around a PICC line on his right arm > 5 years ago. His nephew recently had a Staph infection on his skin on 12/01/19. He spent the night at their house around that time. He has been applying Neosporin but it isn't helping.     From a GI standpoint, he has had more abdominal cramping when he has to have a BM. The cramping is relieved afterwards. He has not noticed a change in his stools. He has 1 BM per day and it is formed and not greasy or oily. He denies constipation, nausea, or vomiting. He has heartburn intermittently. He takes Prilosec but doesn't feel like it helps.     He and his wife are moving into a new house next week in [place]. They are excited to have their own space away from his parents. [name] is working full-time at [employer]. His wife is working as an Museum/gallery exhibitions officer and will Market researcher school soon. They had to put their dog down last year. They decided a while ago that they aren't interested in starting a family. He has seen a Fertility specialist before.     Cystic fibrosis issues:    Sinuses: Uses Flonase and takes Claritin and Singulair. Some yellow drainage recently.     Airway clearance: Does albuterol, and 7% hypertonic saline twice a day.  Reports compliance to it.  Does Pulmozyme as well twice a day.  Uses the vest occasionally.  Uses Tobi nebs every other month.     GI and nutrition: Denies any constipation or diarrhea.  Reports compliance to his Creon.  Has lost 30 lbs.     Hypertension: On lisinopril 10 mg daily. Was on Coreg before, which was started in 2018 as an inpatient for sinus tachycardia.     Diabetes: Followed and managed by endocrinology.  Reports compliance to his insulin. Unable to keep Dexcom on due to sweating at work. Needs a new glucometer and supplies. Denies hypoglycemia.     Mental health issues: Much improved on Trikafta now that he is working full-time and not as sick.    Past Medical History:   Diagnosis Date   ??? Anxiety    ??? Chronic pain  disorder    ??? Cystic fibrosis (CMS-HCC)    ??? Depression    ??? Hypertension    ??? Nonproductive cough 04/05/2018     Past Surgical History:   Procedure Laterality Date   ??? IR INSERT PORT AGE GREATER THAN 5 YRS  03/27/2019    IR INSERT PORT AGE GREATER THAN 5 YRS 03/27/2019 Rush Barer, MD IMG VIR [facility]   ??? PR REMOVAL OF LUNG,LOBECTOMY Right 03/29/2017    Procedure: REMOVAL OF LUNG, OTHER THAN PNEUMONECTOMY; SINGLE LOBE (LOBECTOMY);  Surgeon: Cherie Dark, MD;  [facility]: [facility]IN [facility];  Service: Thoracic       Other History:  The social history and family history were personally reviewed and updated in the patient's electronic medical record.     Home Medications:  Current Outpatient Medications on File Prior to Visit   Medication Sig Dispense Refill   ??? albuterol (PROVENTIL HFA;VENTOLIN HFA) 90 mcg/actuation inhaler Inhale 2 puffs every six (6) hours as needed. 1 Inhaler 1   ??? albuterol 2.5 mg/0.5 mL nebulizer solution Inhale 0.5 mL (2.5 mg total) by nebulization Two (2) times a day. 180 each 3   ??? amitriptyline (ELAVIL) 100 MG tablet TAKE ONE TABLET BY MOUTH AT BEDTIME 30 tablet 1   ??? atorvastatin (LIPITOR) 20 MG tablet Take 20 mg by mouth nightly.     ??? BETHKIS 300 mg/4 mL Nebu Inhale 1 vial via nebulization every 12 hours for 28 days, alternating every other 28 days 224 mL 6   ??? blood-glucose meter kit Dispense meter that is preferred by patient's insurance company 1 each 0   ??? blood-glucose meter,continuous (DEXCOM G6 RECEIVER) Misc Use as directed 1 each 0   ??? blood-glucose sensor (DEXCOM G6 SENSOR) Devi Use sq as directed every 14 days 6 Device 11   ??? blood-glucose transmitter (DEXCOM G6 TRANSMITTER) Devi Use as directed 1 Device 11   ??? budesonide-formoteroL (SYMBICORT) 160-4.5 mcg/actuation inhaler Inhale 2 puffs Two (2) times a day. 3 Inhaler 3   ??? cetirizine (ZYRTEC) 10 MG tablet TAKE 1 TABLET (10 MG TOTAL) BY MOUTH DAILY. 90 tablet 3   ??? CREON 36,000-114,000- 180,000 unit CpDR TAKE 7 TO 8 CAPSULES WITH MEALS AND 5 TO 6 CAPSULES WITH SNACKS 1200 capsule 11   ??? dornase alfa (PULMOZYME) 1 mg/mL nebulizer solution Inhale 2.5 mg Two (2) times a day. 450 mL 3   ??? EASY TOUCH LANCING DEVICE Misc Use as directed.     ??? EASY TOUCH TWIST LANCETS 30 gauge Misc Use as directed.     ??? elexacaftor-tezacaftor-ivacaft (TRIKAFTA) tablet Take 2 Tablets (Elexacaftor 100mg /Tezacaftor 50mg /Ivacaftor 75mg ) by mouth in the morning and 1 tablet (ivacaftor 150mg ) in the evening with fatty food 84 tablet 3   ??? FLUoxetine (PROZAC) 20 MG capsule Take 40 mg by mouth daily.     ??? fluticasone propionate (FLONASE) 50 mcg/actuation nasal spray USE 1 SPRAY IN EACH NOSTRIL EVERY DAY 16 g 11   ??? gabapentin (NEURONTIN) 600 MG tablet Take 600 mg by mouth Three (3) times a day as needed.     ??? insulin ASPART (NOVOLOG FLEXPEN) 100 unit/mL (3 mL) injection pen Use up to 150 units/day, divided 3 TIMES DAILY BEFORE MEALS 135 mL 1   ??? insulin glargine (BASAGLAR, LANTUS) 100 unit/mL (3 mL) injection pen Inject 0.7 mL (70 Units total) under the skin daily. (Patient taking differently: Inject 70 Units under the skin nightly. ) 30 mL 0   ??? lamoTRIgine (LAMICTAL)  200 MG tablet Take 200 mg by mouth daily.     ??? lisinopriL (PRINIVIL,ZESTRIL) 10 MG tablet Take 1 tablet (10 mg total) by mouth daily. 30 tablet 0   ??? melatonin 10 mg Tab Take 10 mg by mouth nightly.     ??? montelukast (SINGULAIR) 10 mg tablet Take 1 tablet (10 mg total) by mouth nightly. 90 tablet 3   ??? MVW COMPLETE FORMUL PROBIOTIC 40 billion cell -15 mg CpDR Take 1 capsule by mouth daily. (Patient taking differently: Take 1 capsule by mouth Two (2) times a day. ) 90 capsule 3   ??? MVW Complete, pediatric multivit 61-D3-vit K, 1,500-800 unit-mcg cap Take 2 capsules by mouth daily. (Patient taking differently: Take 1 capsule by mouth Two (2) times a day. ) 60 capsule 11   ??? nebulizers Misc Use as directed with inhaled medications 4 each 3   ??? omeprazole 20 mg tablet Take 1 tablet (20 mg total) by mouth daily. 90 tablet 3   ??? pen needle, diabetic (BD ULTRA-FINE NANO PEN NEEDLE) 32 gauge x 5/32 (4 mm) Ndle use up to 4 times daily 400 each 12   ??? pramipexole (MIRAPEX) 0.125 MG tablet Take 0.25 mg by mouth every evening.      ??? sod bicarb-sod chlor-neti pot pkdv 1 each by sinus irrigation route two (2) times a day. 50 each 11   ??? sodium chloride 7% 7 % Nebu Inhale 4 mL by nebulization Two (2) times a day. Increase to 4 times while taking antibiotics     ??? traZODone (DESYREL) 100 MG tablet Take 1 tablet (100 mg total) by mouth nightly. 30 tablet 0   ??? XARELTO 20 mg tablet Take 20 mg by mouth daily.     ??? [DISCONTINUED] insulin detemir U-100 (LEVEMIR FLEXTOUCH U-100 INSULN) 100 unit/mL (3 mL) injection pen Use up to 30 units per day, as per MD instructions 30 mL 12     No current facility-administered medications on file prior to visit.        Allergies:  Allergies as of 01/04/2020 - Reviewed 10/30/2019   Allergen Reaction Noted   ??? [place] [aztreonam lysine] Anaphylaxis 12/27/2016   ??? Cefepime Itching and Nausea Only 09/06/2015   ??? Other Anaphylaxis and Other (See Comments) 09/06/2015   ??? Slo-bid 100 Anaphylaxis 06/20/2017   ??? Banana Itching and Nausea And Vomiting 07/29/2016   ??? Tobramycin Tinnitus 09/06/2015     Genetics:  S945L and [id number] insertion  ??  Airway Clearance Regimen:  HS BID  Pulmozyme  ??  Inhaled ABX:  TOBI nebs every other month   ??  Hemoptysis:   No  ABPA:             No  Ptx:                  No  Sinusitus:       Yes  ??  Panc Insuf:     Yes  PEG:                No  DIOS:              No bowel issues  CF Liver Dz:   No  ??  Diabetes:        Uncontrolled - follows w/ endocrine  Osteopenia:   N/A  ??  Depression:   Yes  ??  Other Co-morbidities:  N/A  ??  Social Setting:  Lives with wife near [place]  Micro History:  ??  MABSC  - Assumed S to erythro from [facility]  06/2017: MASBC not present, but M gordonae is   ??  MSSA (09/29/19)     Smooth PsA  S: Cipro, Levo, Tobra  I: Aztreo, Cefe, Ceftaz, PT, LVX  ??  Stenotrophamonas  R: Ceftat  ??  MABSC (macrolide sensitive)  Last Negative Cx: 03/2017  Last Positive: 02/21/17  ????  Review of Systems:  A comprehensive review of systems was completed and negative except as noted in HPI.    PHYSICAL EXAM:     Vitals:    01/04/20 1424   BP: 113/76   BP Site: L Arm   BP Position: Sitting   BP Cuff Size: Large   Pulse: 98   Temp: 36.4 ??C (97.6 ??F)   TempSrc: Temporal   SpO2: 98%   Weight: (!) 110.7 kg (244 lb)   Height: 182.9 cm (6')     Body mass index is 33.09 kg/m??.  Wt Readings from Last 3 Encounters:   01/04/20 (!) 110.7 kg (244 lb)   09/29/19 (!) 113 kg (249 lb 1.9 oz)   03/24/19 (!) 122 kg (269 lb 1.1 oz)     GEN: Cooperative male, sitting up on exam table, NAD  HEENT: multiple crusted superfical wounds on nose and around mouth (see media)   NECK: Supple, trachea midline  LYMPH: No palpable lymphadenopathy   HEART/CV: RRR, S1, S2 nl, no MRG  LUNGS: CTA bilaterally, no crackles or wheezes, normal WOB on RA  ABD: NABS, soft, NT/ND, no rebound or guarding, no masses, no hepatomegaly noted   EXT: No cyanosis, clubbing, or edema  SKIN: Additional crusted superficial wounds in bilateral axillae and inner upper arm   NEURO: No focal deficits noted  PSYCH: Awake, alert, and interactive. Mood and affect appropriate.     LABORATORY and RADIOLOGY DATA:     Pulmonary Function Tests:        Pertinent Laboratory Data:  CF Sputum Culture   Date Value Ref Range Status   09/29/2019 1+ Oropharyngeal Flora Isolated  Final   09/29/2019 3+ Smooth Pseudomonas aeruginosa (A)  Final   09/29/2019 1+ Methicillin-Susceptible Staphylococcus aureus (A)  Final        AFB Smear   Date Value Ref Range Status   09/29/2019  No Acid Fast Bacilli Seen Final    NO ACID FAST BACILLI SEEN- 3 negative smears do not exclude pulmonary TB. If active pulmonary TB is suspected, continue airborne isolation until pulmonary disease is excluded by negative cultures.     AFB Culture   Date Value Ref Range Status   09/29/2019 No Acid Fast Bacilli Detected  Final        IgE, Total   Date Value Ref Range Status   06/20/2017 32.8 4 - 59 kU/L Final        Total Bilirubin   Date Value Ref Range Status   10/02/2019 0.5 0.0 - 1.2 mg/dL Final     AST   Date Value Ref Range Status   10/02/2019 34 19 - 55 U/L Final     ALT   Date Value Ref Range Status   10/02/2019 47 <50 U/L Final     Alkaline Phosphatase   Date Value Ref Range Status   10/02/2019 91 38 - 126 U/L Final        INR   Date Value Ref Range Status   01/29/2019 1.61  Final  Hemoglobin A1C   Date Value Ref Range Status   09/29/2019 8.8 (H) 4.8 - 5.6 % Final     Estimated Average Glucose   Date Value Ref Range Status   09/29/2019 206 mg/dL Final        Vitamin A   Date Value Ref Range Status   11/19/2016 44.4 32.5 - 78.0 mcg/dL Final     Comment:        -------------------ADDITIONAL INFORMATION-------------------  This test was developed and its performance characteristics   determined by [facility] in a manner consistent with CLIA   requirements. This test has not been cleared or approved by   the U.S. Food and Drug Administration.     Test Performed by:  [facility]  [address] [place], [place], [place] [address]     Vitamin D Total (25OH)   Date Value Ref Range Status   03/19/2017 27.9 20.0 - 80.0 ng/mL Final        ASSESSMENT and PLAN     Christian Young is a 34 y.o. male with cystic fibrosis (S945L and [phone] insertion) who presents to clinic for routine CF follow-up. He was last admitted to [facility] in February 2021 and treated with IV Tobramycin/IV Zosyn x 2 weeks. His FEV1 was 68% prior to discharge. He was switched from [facility] to [place], and he notes significant improvement. His FEV1 is stable at 67%. He is working full-time and also reports significant improvement in his mental health.     His biggest complaint is developing multiple skin wounds over the past 6 days. It started in his interior nose and now has spread to multiple places on his face, left ear and bilateral armpits. I'm concerned about a Staph infection. Does not seem consistent with HSV. We'll try to culture the wounds and will treat for MRSA.     Plan:   1) Aerobic superficial skin culture   2) Start Mupirocin ointment inside the nose and to affected areas twice daily; start Bactrim DS 1 tab BID x 14 days (will obtain BMP this week since he is on Lisinopril); advised to wipe down surfaces daily and wash sheets/towels in hot water weekly for 21 days; sent Derm referral as well  3) CF sputum culture via deep pharyngeal swab (unable to expectorate sputum thus no AFB culture today)   4) Continue home CF inhaled meds and airway clearance twice daily  5) Continue TOBI nebs BID every other month   6) Continue Trikafta - due for LFT's   7) Due for annual CF labs and fasting lipid panel - will obtain locally, PCP manages cholesterol and BP   8) Continue Creon and MVW vitamins  9) Switch from Omeprazole to Famotidine 20 mg daily   10) Continue insulin as prescribed - patient needs a new glucometer and supplies, also due for Endo follow up, advised to call for an appointment and to restart checking his BG's   11) Patient seen by CF SW today - patient following locally for mental health, reports improvement since starting Trikafta   12) Patient received Pfizer vaccines in March/April 2021    The patient will follow up with Dr. [name] in 3 months, or sooner as needed.     Durenda Hurt [place], PA-C  [facility]   [phone]

## 2020-01-04 NOTE — Unmapped (Signed)
[facility]  Adult Cystic Fibrosis Clinic      Met with Christian Young and his wife Christian Young at his clinic visit to check-in and introduce self. Christian Young has been on Trikafta since March and states it has changed my life. He has been at his current job since December, which is the longest he has held the same job. He states he used to be hospitalized frequently, which resulted in loss of employment. He also states he has more energy on Trikafta. He reports he has had difficulty staying asleep for the past 2 weeks, and his Trazodone was just increased by his PCP. He does endorse a history of depression with SI, and had an inpt psychiatric hospitalization at [facility] in 2018. He currently takes Lamictal, Prozac, and Trazodone, and states he is doing well mentally. He admits that he owns a gun but he denies any SI and his wife states the gun is locked. He and his wife have been living with his parents but will move into their own hone next week. They will be renting the home from his wife's sister, with a plan to transition to rent to own in the future.       MENTAL HEALTH/COPING: SW reviewed the mental health screening process and introduced the substance use screening process.       Christian Young self administered the PHQ-9 for depression and GAD-7 for anxiety. He scored a 3 on PHQ-9 indicating no/mild depression and he scored a 1 on the GAD-7 indicating no anxiety. He denies current SI.     Christian Young sees see Christian Larry, NP for his psychiatric medications. He states he learned coping skills when he was inpatient at [facility] and finds meditation helpful. Encouraged him to reach out with any concerns and provided hi with this SW's contact information.     SUBSTANCE USE:  Christian Young self-administered the AUDIT for alcohol use and the DAST-10 for drug abuse.  He scored a 3 on the AUDIT, indicating low-risk alcohol use.  He scored a zero on the DAST-10, indicating no drug use.      No intervention needed.

## 2020-01-04 NOTE — Unmapped (Signed)
Throat swab collected as ordered, wound swab collected under the supervision of [employer], PA-C. Samples labeled in front of the patient after confirming name and DOB. Samples tubed to the lab.    [name], RN  CF Nurse Coordinator   [phone]

## 2020-01-04 NOTE — Unmapped (Signed)
Adult Cystic Fibrosis Clinic Pharmacist Visit     Christian Young is a 34 y.o. male with cystic fibrosis (genotype: [phone]) being seen for annual medication assessment. Pertinent CF related problems include pancreatic insufficiency, CFRD and GI manifestations. Last CF provider visit with Dr. [name] on 03/16/2019 (telehealth). Since then patient's had 2 hospital admissions for CF exacerbation requiring IV antibiotics. Most recent admission was February 2021, treated with IV tobramycin + IV zosyn x14 days and started on Trikafta. Today, patient presents to this visit accompanied by his wife, Christian Young, who provides part of the history.    Interval History: Today, Christian Young reports he's been feeling great since starting Trikafta. He ran out of Trikafta earlier this month since he wasn't able to answer the [facility] refill calls and would only be available to call after hours. From a neuro/psych standpoint, endorses difficulty staying asleep for the past 2 weeks (typically falls asleep with no issues). 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 Has tried Skin-Tac which hasn't helped.     Patient reported:  ??? Barriers to adherence: Has not filled/picked up from pharmacy  ??? Side Effects reported: No   ??? Insurance changes: No     MEDICATION ASSESSMENT/PLAN     MEDICATION CHANGES AFTER VISIT:  ??? Stop omeprazole and start famotidine 20 mg once daily   ??? Start mupirocin ointment topically to affected areas and Bactrim DS 1 tablet BID x14 days for facial skin infection concerning for staph, per PA-C Rupcich.  ??? Discussed option to send a MyChart message to the [facility] pharmacist/reply to all questions to schedule medication refills if he's unable to call during work hours.  ??? Patient to follow up with endocrine regarding BG testing supplies    PULMONARY   Airway Clearance: albuterol nebs twice daily, albuterol HFA Q6H PRN, HTS 7% twice daily, dornase alfa daily  Chronic Respiratory/Sinus: Symbicort 160-4.69mcg: 2 puffs BID, Flonase: 1 spray(s) daily, montelukast 10mg  nightly, cetirizine 10mg  daily, netti-pot sinus irrigation BID  CFTR Modulator: On Trikafta since 10/2019. Taking as prescribed with fatty food  ??? Pulmonary medications: Misses doses: Less than half the week.  Moderate obstruction (FEV1 40-69%). PFTs stable     ID/ANTIMICROBIALS   Inhaled antibiotic(s): Inhaled tobramycin (Bethkis) BID.  ??? Chronically grows Mucoid and Smooth P. aeruginosa, MSSA. More than 2 exacerbations over last year.   ??? Last needed IV antibiotics 09/2019 (PO levofloxacin --> IV Zosyn + IV tobramycin x14 days), oral antibiotics 06/2020 (ciprofloxacin 750 mg BID x14 days)    GI/FEN MEDICATIONS   Pancreatic Enzyme: Creon 36,000 units: 7-8 caps with meals and 5-6 caps with snacks.   ??? Taking as prescribed. Averages 2-3 meals/day and 0 snacks/day.  No reports of malabsorption. Based on reported number of meals/snacks per day, within max 15,000 units lipase/kg/day.   Vitamins: MVW Complete Softgels 2 capsule(s) daily, Probiotic 1 capsule(s) daily.   ??? Gets CF vitamins from: Direct from [facility]. Taking as prescribed.  ??? Overdue for vitamin levels.   Other GI: Omeprazole 20mg  daily   ??? Taking as prescribed. Has intermittent reflux a few times a week. Discussed risks of chronic PPI use with PA-C Rupcich and patient, and will plan to STOP omeprazole and START famotidine 20 mg daily.    ENDOCRINE   Insulin: Basaglar (glargine) 70 units SQ daily, Novolog SSI  Bone/Thyroid/Other: N/A  ??? Last A1c 8.8% above goal  on 09/29/2019. Managed by endocrine. Last visit with Dr. Monia Sabal 04/2019.  ??? Hasn't been checking home blood sugars due to issues with Dexcom sensors falling off. Needs new meter/strips for fingersticks.  ??? Last DEXA 02/2017 WNL.    NEURO/PSYCH   Depression/Anxiety/Sleep: fluoxetine 40 mg daily, gabapentin 600 mg TID PRN, amitriptyline 100 mg nightly, trazodone 100 mg at bedtime, melatonin 10 mg at bedtime, lamotrigine 200 mg daily.  ??? Taking as prescribed. Anxiety/depression symptoms well-controlled. Psych/neuro managed by local provider. Recently noticed trouble staying asleep. If sleep issues continue, can consider inverting Trikafta dose in the future.    OTHER   ??? Xarelto 20 mg daily (for recurrent DVTs)  ??? Lisinopril 10 mg daily  ??? Atorvastatin 20 mg nightly  ??? Taking as prescribed. BP today well-controlled, goal <140/90. Managed by local PCP.    MEDICATION MANAGEMENT   ??? Adherence: Moderate compliance: Minor variances to doses prescribed, misses occasional doses during the week. Has medications bubble packed by [facility] pharmacy.  ??? Access: No issues related to access. Reviewed ways to get Trikafta refills from [facility] pharmacy.  ??? Prescription Renewals: None  ??? Pharmacies used for CF medications:   [facility]    FOLLOW UP LAB(S)   ??? Modulator: quarterly LFT (due now, patient would like to get at local PCPs office)  ??? Vitamin levels (due now, patient would like to get at local PCPs office)   ??? Hemoglobin A1c (due at next endocrine follow up)  ??? CBC with diff   ??? CMP    I spent a total of 20 minutes face to face with the patient delivering clinical care and providing education/counseling. Medications reviewed in EPIC medication station and updated today by the clinical pharmacist practitioner.     Recommendations and medication-related problems were discussed directly with pulmonologist, Durenda Hurt Rupcich, PA-C.    Electronically signed:  Barbette Hair, PharmD, MPH, BCPS, CPP  Clinical Pharmacist Practitioner  [facility] Adult Cystic Fibrosis Clinic  [phone]    I am located on-site and the patient is located on-site for this visit.      LABS/TESTING   Sputum culture history:   Date Value   09/29/2019 1+ Oropharyngeal Flora Isolated   09/29/2019 3+ Smooth Pseudomonas aeruginosa (A)   09/29/2019 1+ Methicillin-Susceptible Staphylococcus aureus (A)   03/24/2019 4+ Oropharyngeal Flora Isolated   03/24/2019 3+ Smooth Pseudomonas aeruginosa (A)     Last AFB Culture:   AFB Culture   Date Value   09/29/2019 No Acid Fast Bacilli Detected   03/25/2019 No Acid Fast Bacilli Detected   11/20/2018 Mycobacterium avium complex (A)     Latest LFTs:   Lab Results   Component Value Date    AST 34 10/02/2019    ALT 47 10/02/2019    ALKPHOS 91 10/02/2019    BILITOT 0.5 10/02/2019    BILIDIR 0.20 10/02/2019     Last Vitamin Levels:   Lab Results   Component Value Date    VITAMINA 44.4 11/19/2016    VITDTOTAL 27.9 03/19/2017    PT 18.7 (H) 01/29/2019    INR 1.61 01/29/2019     Last OGTT:   No results found for: GLUF, GLUCOSE2HR  OUTPATIENT MEDICATION LIST     Current Outpatient Medications on File Prior to Visit Notes   Medication Sig    ??? albuterol (PROVENTIL HFA;VENTOLIN HFA) 90 mcg/actuation inhaler Inhale 2 puffs every six (6) hours as needed. PRN -  hasn't needed since starting Trikafta.   ??? albuterol 2.5 mg/0.5 mL nebulizer solution Inhale 0.5 mL (2.5 mg total) by nebulization Two (2) times a day. Taking as prescribed     ??? amitriptyline (ELAVIL) 100 MG tablet TAKE ONE TABLET BY MOUTH AT BEDTIME Taking as prescribed     ??? atorvastatin (LIPITOR) 20 MG tablet Take 20 mg by mouth nightly. Taking as prescribed   ??? BETHKIS 300 mg/4 mL Nebu Inhale 1 vial via nebulization every 12 hours for 28 days, alternating every other 28 days Taking as prescribed - OFF month   ??? blood-glucose meter,continuous (DEXCOM G6 RECEIVER) Misc Use as directed Supplies   ??? blood-glucose sensor (DEXCOM G6 SENSOR) Devi Use sq as directed every 14 days Hasn't used in a while due to issues with sensor falling off   ??? blood-glucose transmitter (DEXCOM G6 TRANSMITTER) Devi Use as directed Supplies   ??? budesonide-formoteroL (SYMBICORT) 160-4.5 mcg/actuation inhaler Inhale 2 puffs Two (2) times a day. Taking as prescribed     ??? cetirizine (ZYRTEC) 10 MG tablet TAKE 1 TABLET (10 MG TOTAL) BY MOUTH DAILY. Taking as prescribed   ??? CREON 36,000-114,000- 180,000 unit CpDR TAKE 7 TO 8 CAPSULES WITH MEALS AND 5 TO 6 CAPSULES WITH SNACKS Taking as prescribed   ??? dornase alfa (PULMOZYME) 1 mg/mL nebulizer solution Inhale 2.5 mg Two (2) times a day. Taking as prescribed   ??? EASY TOUCH LANCING DEVICE Misc Use as directed. Supplies   ??? EASY TOUCH TWIST LANCETS 30 gauge Misc Use as directed. Supplies   ??? elexacaftor-tezacaftor-ivacaft (TRIKAFTA) tablet Take 2 Tablets (Elexacaftor 100mg /Tezacaftor 50mg /Ivacaftor 75mg ) by mouth in the morning and 1 tablet (ivacaftor 150mg ) in the evening with fatty food Taking as prescribed - w/ yogurt or whole milk.   ??? fexofenadine (ALLEGRA) 180 MG tablet Take 1 tablet (180 mg total) by mouth daily. NOT taking - removed from med list.   ??? FLUoxetine (PROZAC) 40 MG capsule Take 40 mg by mouth daily. Taking as prescribed   ??? fluticasone propionate (FLONASE) 50 mcg/actuation nasal spray USE 1 SPRAY IN EACH NOSTRIL EVERY DAY Taking as prescribed   ??? gabapentin (NEURONTIN) 600 MG tablet Take 600 mg by mouth Three (3) times a day as needed. Taking as prescribed ~3x/day.   ??? insulin ASPART (NOVOLOG FLEXPEN) 100 unit/mL (3 mL) injection pen Use up to 150 units/day, divided 3 TIMES DAILY BEFORE MEALS Taking as prescribed ~45-60 units per meal.   ??? insulin glargine (BASAGLAR, LANTUS) 100 unit/mL (3 mL) injection pen Inject 0.7 mL (70 Units total) under the skin daily. Taking as prescribed   ??? lamoTRIgine (LAMICTAL) 200 MG tablet Take 200 mg by mouth daily. Taking as prescribed   ??? lisinopriL (PRINIVIL,ZESTRIL) 10 MG tablet Take 1 tablet (10 mg total) by mouth daily. Taking as prescribed   ??? melatonin 10 mg Tab Take 10 mg by mouth nightly. Taking as prescribed   ??? montelukast (SINGULAIR) 10 mg tablet Take 1 tablet (10 mg total) by mouth nightly. Taking as prescribed   ??? MVW COMPLETE FORMUL PROBIOTIC 40 billion cell -15 mg CpDR Take 1 capsule by mouth daily. (Patient taking differently: Take 1 capsule by mouth Two (2) times a day. ) Taking as prescribed   ??? MVW Complete, pediatric multivit 61-D3-vit K, 1,500-800 unit-mcg cap Take 2 capsules by mouth daily. (Patient taking differently: Take 1 capsule by mouth Two (2) times a day. ) Taking as prescribed   ??? nebulizers Misc Use as directed with  inhaled medications Supplies   ??? omeprazole 20 mg tablet Take 1 tablet (20 mg total) by mouth daily. Taking as prescribed   ??? pen needle, diabetic (BD ULTRA-FINE NANO PEN NEEDLE) 32 gauge x 5/32 (4 mm) Ndle use up to 4 times daily Supplies   ??? pramipexole (MIRAPEX) 0.125 MG tablet Take 0.25 mg by mouth every evening.  Taking as prescribed   ??? sod bicarb-sod chlor-neti pot pkdv 1 each by sinus irrigation route two (2) times a day. Taking as prescribed   ??? sodium chloride 7% 7 % Nebu Inhale 4 mL by nebulization Two (2) times a day. Increase to 4 times while taking antibiotics Taking as prescribed   ??? traZODone (DESYREL) 100 MG tablet Take 1 tablet (100 mg total) by mouth nightly. Taking as prescribed   ??? XARELTO 20 mg tablet Take 20 mg by mouth daily. Taking as prescribed

## 2020-01-06 DIAGNOSIS — Z95828 Presence of other vascular implants and grafts: Principal | ICD-10-CM

## 2020-01-06 DIAGNOSIS — E089 Diabetes mellitus due to underlying condition without complications: Principal | ICD-10-CM

## 2020-01-06 NOTE — Unmapped (Signed)
Spoke to [place] on 01/06/20 about needing labs to check his Potassium level while on Bactrim and his home Lisinopril. Will complete at [facility] along with his other annual CF labs. Advised to go this week. Patient will ask his boss if he can take time off to do that.     Durenda Hurt [place], PA-C  [facility] Adult Cystic Fibrosis Clinic   [phone]

## 2020-01-06 NOTE — Unmapped (Signed)
Christian Young informed staff during clinic visit on Monday that he is needing his port-a-cath supplies ordered through [facility].     Spoke with Mia at [facility] at [phone]. Asked that we route the order, insurance information to their intake fax at [phone].    Items faxed. Received confirmation of fax receipt.     [name] through [facility] of the completed request.    [name]. [name], RN  CF Nurse Coordinator   [phone]

## 2020-01-13 NOTE — Unmapped (Signed)
Left message with [place] inquiring after his labs, as no labs seen in the [facility] portal.    [name]. [name], RN  CF Nurse Coordinator   [phone]

## 2020-01-14 MED ORDER — AMITRIPTYLINE 100 MG TABLET
ORAL_TABLET | 0 refills | 0.00000 days
Start: 2020-01-14 — End: ?

## 2020-01-18 NOTE — Unmapped (Signed)
[facility] Refill Coordination Note    Specialty Medication(s) to be Shipped:   CF/Pulmonary: -Trikafta     Christian Young, DOB: [birth year]  Phone: [phone] (home)     All above HIPAA information was verified with patient.     Was a Nurse, learning disability used for this call? No    Completed refill call assessment today to schedule patient's medication shipment from the [facility] [phone]).       Specialty medication(s) and dose(s) confirmed: Regimen is correct and unchanged.   Changes to medications: Mosi reports no changes at this time.  Changes to insurance: No  Questions for the pharmacist: No    Confirmed patient received Welcome Packet with first shipment. The patient will receive a drug information handout for each medication shipped and additional FDA Medication Guides as required.       DISEASE/MEDICATION-SPECIFIC INFORMATION        For CF patients: CF Healthwell Grant Active? Yes, **HWG active VST til 05/06/20 (Both Swept)**    SPECIALTY MEDICATION ADHERENCE     Medication Adherence    Patient reported X missed doses in the last month: 0  Specialty Medication: Trikafta  Patient is on additional specialty medications: No  Informant: patient  Reliability of informant: reliable  Support network for adherence: family member        Trikafta: 7 days of medicine on hand     SHIPPING     Shipping address confirmed in Epic.     Delivery Scheduled: Yes, Expected medication delivery date: 01/20/2020.     Medication will be delivered via UPS to the prescription address in Epic WAM.    Bernetta Sutley P [name] [facility] Specialty Technician

## 2020-01-19 MED FILL — TRIKAFTA 100-50-75 MG (D)/150 MG (N) TABLETS: 28 days supply | Qty: 84 | Fill #3 | Status: AC

## 2020-01-19 MED FILL — TRIKAFTA 100-50-75 MG (D)/150 MG (N) TABLETS: 28 days supply | Qty: 84 | Fill #3

## 2020-01-19 NOTE — Unmapped (Signed)
Patient is on a course of Bactrim for multiple skin infections. Went for labs at [facility].     BUN 13  Cr 0.89  K 4.7  Tbili 0.6  ALP 122  AST 26  ALT 31  INR 1.1  A1c 9.4%  Vitamin A pending  Vitamin E pending  Vitamin D 25,OH 32.3  CRP 3  Total IgE level pending    Renne Musca  [facility] Adult Cystic Fibrosis Clinic   [phone]

## 2020-01-19 NOTE — Unmapped (Signed)
Adult Cystic Fibrosis Clinic Pharmacist Note     Purpose: Lab Results  ??? Reviewed LFTs from outpatient lab draw, WNL, ok to continue Trikafta, recheck in 3 months (Sept 2021).  ??? Will enter full documentation note when labs are interfaced.    Alton Revere, PharmD, BCPS, CPP  Clinical Pharmacist Practitioner  [facility] Adult Cystic Fibrosis Clinic

## 2020-01-22 LAB — COMPREHENSIVE METABOLIC PANEL
ALBUMIN: 4.5 g/dL (ref 4.0–5.0)
ALKALINE PHOSPHATASE: 122 IU/L — ABNORMAL HIGH (ref 48–121)
ALT (SGPT): 31 IU/L (ref 0–44)
AST (SGOT): 26 IU/L (ref 0–40)
BILIRUBIN TOTAL: 0.6 mg/dL (ref 0.0–1.2)
BLOOD UREA NITROGEN: 13 mg/dL (ref 6–20)
BUN / CREAT RATIO: 15 (ref 9–20)
CHLORIDE: 98 mmol/L (ref 96–106)
CO2: 22 mmol/L (ref 20–29)
CREATININE: 0.89 mg/dL (ref 0.76–1.27)
GFR MDRD AF AMER: 132 mL/min/{1.73_m2}
GFR MDRD NON AF AMER: 114 mL/min/{1.73_m2}
GLOBULIN, TOTAL: 2.8 g/dL (ref 1.5–4.5)
POTASSIUM: 4.7 mmol/L (ref 3.5–5.2)
SODIUM: 135 mmol/L (ref 134–144)
TOTAL PROTEIN: 7.3 g/dL (ref 6.0–8.5)

## 2020-01-22 LAB — VITAMIN D 25-HYDROXY: 25-Hydroxyvitamin D2+25-Hydroxyvitamin D3:MCnc:Pt:Ser/Plas:Qn:: 32.3

## 2020-01-22 LAB — ALBUMIN: Albumin:MCnc:Pt:Ser/Plas:Qn:: 4.5

## 2020-01-22 LAB — VITAMIN A RESULT: Retinol:MCnc:Pt:Ser/Plas:Qn:: 42.4

## 2020-01-22 LAB — PROTHROMBIN TIME: Coagulation tissue factor induced:Time:Pt:PPP:Qn:Coag: 11.8

## 2020-01-22 LAB — HEMOGLOBIN A1C: Hemoglobin A1c/Hemoglobin.total:MFr:Pt:Bld:Qn:: 9.4 — ABNORMAL HIGH

## 2020-01-22 LAB — TOTAL IGE: IgE:ACnc:Pt:Ser/Plas:Qn:: 20

## 2020-01-22 LAB — VITAMIN E,GAMMA: Gamma tocopherol:MCnc:Pt:Ser/Plas:Qn:: 1.3

## 2020-01-22 LAB — C-REACTIVE PROTEIN: C reactive protein:MCnc:Pt:Ser/Plas:Qn:: 3

## 2020-01-22 NOTE — Unmapped (Signed)
Nutrition - Brief: Labs    [name]'s labs completed at [facility]; results available in Care Everywhere. CRP and vitamin levels are WNL. Suggest continuation of current vitamin regimen - MVW gel cap 2 daily. Repeat labs in 1 year. [name] informed of results and recommendations via MyChart.    Jackqulyn Livings MPH, RD, LDN  Pager: [phone]

## 2020-01-22 NOTE — Unmapped (Signed)
Adult CF Clinic Pharmacist Note: CFTR Modulator Lab Monitoring     Dominion Kathan is on Trikafta.  Start date/month: Feb 2021    Latest LFTs done: 01/18/2020  Lab Results   Component Value Date    AST 26 01/18/2020    AST 34 10/02/2019    AST  09/29/2019      Comment:      Hemolyzed Specimen    AST 45 04/06/2019     Lab Results   Component Value Date    ALT 31 01/18/2020    ALT 47 10/02/2019    ALT 42 09/29/2019    ALT 59 (H) 04/06/2019     Lab Results   Component Value Date    ALKPHOS 122 (H) 01/18/2020    ALKPHOS 91 10/02/2019    ALKPHOS  09/29/2019      Comment:      Hemolyzed Specimen    ALKPHOS 63 04/06/2019     Lab Results   Component Value Date    BILITOT 0.6 01/18/2020    BILITOT 0.5 10/02/2019    BILITOT 0.5 09/29/2019    BILITOT  09/29/2019      Comment:      Hemolyzed Specimen       Assessment/Plan:  ??? AST/ALT WNL.  ??? Continue CFTR modulator therapy.  ??? Next LFTs due in 3 months: Sept 2021.    [name], PharmD, BCPS, CPP  Clinical Pharmacist Practitioner  [facility] Adult Cystic Fibrosis Clinic  Pager: [phone]

## 2020-01-26 NOTE — Unmapped (Signed)
Thank you Polly!

## 2020-01-27 NOTE — Unmapped (Signed)
[facility]  Adult Cystic Fibrosis Clinic    As part of [facility] CF Grocery Assistance Program, Admin Coordinator mailed patient [phone] gift card for food due to financial difficulties and associated food insecurity on 01/27/20.

## 2020-02-05 NOTE — Progress Notes (Deleted)
Office Visit Note  Patient: James Proctor             Date of Birth: July 22, 1986           MRN: [id number]             PCP: Madison Hickman, FNP Referring: Madison Hickman, FNP Visit Date: 02/08/2020 Occupation: '@GUAROCC'$ @  Subjective:  No chief complaint on file.   History of Present Illness: Bodi Palmeri is a 34 y.o. male ***   Activities of Daily Living:  Patient reports morning stiffness for *** {minute/hour:19697}.   Patient {ACTIONS;DENIES/REPORTS:21021675::"Denies"} nocturnal pain.  Difficulty dressing/grooming: {ACTIONS;DENIES/REPORTS:21021675::"Denies"} Difficulty climbing stairs: {ACTIONS;DENIES/REPORTS:21021675::"Denies"} Difficulty getting out of chair: {ACTIONS;DENIES/REPORTS:21021675::"Denies"} Difficulty using hands for taps, buttons, cutlery, and/or writing: {ACTIONS;DENIES/REPORTS:21021675::"Denies"}  No Rheumatology ROS completed.   PMFS History:  Patient Active Problem List   Diagnosis Date Noted  . Bronchiectasis ([place]) 11/06/2016  . 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 Pain   . Cystic fibrosis ([place])   . Pleuritic chest pain 11/05/2016  . Cystic fibrosis exacerbation ([place]) 07/29/2016  . Pain management 07/29/2016  . Hyperglycemia 07/29/2016    Past Medical History:  Diagnosis Date  . Cystic fibrosis ([place])   . Hypertension     Family History  Problem Relation Age of Onset  . Cystic fibrosis Neg Hx    Past Surgical History:  Procedure Laterality Date  . LOBECTOMY Right 03/2017   partial   Social History   Social History Narrative  . Not on file    There is no immunization history on file for this patient.   Objective: Vital Signs: There were no vitals taken for this visit.   Physical Exam   Musculoskeletal Exam: ***  CDAI Exam: CDAI Score: -- Patient Global: --; Provider Global: -- Swollen: --; Tender: -- Joint Exam 02/08/2020   No joint exam has been documented for this visit   There is currently  no information documented on the homunculus. Go to the Rheumatology activity and complete the homunculus joint exam.  Investigation: No additional findings.  Imaging: No results found.  Recent Labs: Lab Results  Component Value Date   WBC 8.0 02/27/2018   HGB 14.8 02/27/2018   PLT 326 02/27/2018   NA 133 (L) 02/27/2018   K 4.1 02/27/2018   CL 97 (L) 02/27/2018   CO2 23 02/27/2018   GLUCOSE 328 (H) 02/27/2018   BUN 21 (H) 02/27/2018   CREATININE 1.00 02/27/2018   BILITOT 0.6 11/07/2016   ALKPHOS 58 11/07/2016   AST 23 11/07/2016   ALT 29 11/07/2016   PROT 6.3 (L) 11/07/2016   ALBUMIN 3.3 (L) 11/07/2016   CALCIUM 10.0 02/27/2018   GFRAA >60 02/27/2018    Speciality Comments: No specialty comments available.  Procedures:  No procedures performed Allergies: [place] [aztreonam], Cefepime, Tobramycin, Banana, and Theophyllines   Assessment / Plan:     Visit Diagnoses: Elevated sed rate - 04/30/19: ESR 56, ANA-, RF<10, TSH 2.75, CK 269  Cystic fibrosis (HCC)  Bronchiectasis with acute lower respiratory infection (HCC)  Mycobacterium abscessus infection  Essential hypertension  RLS (restless legs syndrome)  Other insomnia  Localized enlarged lymph nodes  Anxiety and depression  Orders: No orders of the defined types were placed in this encounter.  No orders of the defined types were placed in this encounter.   Face-to-face time spent with patient was *** minutes. Greater than 50% of time was spent in counseling and coordination of care.  Follow-Up Instructions: No follow-ups  on file.   Ofilia Neas, PA-C  Note - This record has been created using Dragon software.  Chart creation errors have been sought, but may not always  have been located. Such creation errors do not reflect on  the standard of medical care.

## 2020-02-08 ENCOUNTER — Ambulatory Visit: Payer: Managed Care, Other (non HMO) | Admitting: Rheumatology

## 2020-02-16 MED ORDER — TRIKAFTA 100-50-75 MG (D)/150 MG (N) TABLETS
ORAL_TABLET | 2 refills | 0 days | Status: CP
Start: 2020-02-16 — End: ?
  Filled 2020-02-18: qty 84, 28d supply, fill #0

## 2020-02-16 NOTE — Unmapped (Signed)
Adult Cystic Fibrosis Clinic Pharmacist Note      Medication orders were sent for: Christian Young     1. Cystic fibrosis (CMS-HCC)    - elexacaftor-tezacaftor-ivacaft (TRIKAFTA) tablet; Take 2 Tablets (Elexacaftor 100mg /Tezacaftor 50mg /Ivacaftor 75mg ) by mouth in the morning and 1 tablet (ivacaftor 150mg ) in the evening with fatty food  Dispense: 84 tablet; Refill: 2    Pharmacy sent to:  [facility]    Barbette Hair, PharmD, MPH, BCPS, CPP  Clinical Pharmacist Practitioner  [facility] Adult Cystic Fibrosis [facility]  [phone]    (I am covering for Alton Revere, CPP who is away from the office today.)

## 2020-02-16 NOTE — Unmapped (Signed)
[facility] Refill Coordination Note    Specialty Medication(s) to be Shipped:   CF/Pulmonary: -Trikafta     Christian Young, DOB: [birth year]  Phone: [phone] (home)     All above HIPAA information was verified with patient.     Was a Nurse, learning disability used for this call? No    Completed refill call assessment today to schedule patient's medication shipment from the [facility] [phone]).       Specialty medication(s) and dose(s) confirmed: Regimen is correct and unchanged.   Changes to medications: Christian Young reports no changes at this time.  Changes to insurance: No  Questions for the pharmacist: No    Confirmed patient received Welcome Packet with first shipment. The patient will receive a drug information handout for each medication shipped and additional FDA Medication Guides as required.       DISEASE/MEDICATION-SPECIFIC INFORMATION        For CF patients: CF Healthwell Grant Active? Yes, **HWG active VST til 05/06/20**    SPECIALTY MEDICATION ADHERENCE     Medication Adherence    Patient reported X missed doses in the last month: 0  Specialty Medication: Trikafta  Patient is on additional specialty medications: No  Informant: patient  Reliability of informant: reliable  Support network for adherence: family member        Trikafta: 7 days of medicine on hand     SHIPPING     Shipping address confirmed in Epic.     Delivery Scheduled: Yes, Expected medication delivery date: 02/19/2020.     Medication will be delivered via UPS to the prescription address in Epic WAM.    Christian Young [facility] Specialty Technician

## 2020-02-17 DIAGNOSIS — K8689 Other specified diseases of pancreas: Principal | ICD-10-CM

## 2020-02-17 MED ORDER — CREON 36,000 UNIT-114,000 UNIT-180,000 UNIT CAPSULE,DELAYED RELEASE
ORAL_CAPSULE | 11 refills | 0 days | Status: CP
Start: 2020-02-17 — End: ?

## 2020-02-17 MED ORDER — CETIRIZINE 10 MG TABLET
ORAL_TABLET | 3 refills | 0 days | Status: CP
Start: 2020-02-17 — End: ?

## 2020-02-17 NOTE — Unmapped (Signed)
[facility]  Adult Cystic Fibrosis Clinic    As part of [facility] CF Grocery Assistance Program, Admin Coordinator mailed patient [phone] gift card for food due to financial difficulties and associated food insecurity on 02/17/20.

## 2020-02-18 MED FILL — TRIKAFTA 100-50-75 MG (D)/150 MG (N) TABLETS: 28 days supply | Qty: 84 | Fill #0 | Status: AC

## 2020-02-19 NOTE — Unmapped (Signed)
Hi all,  I rec'd a vm message from Christian Young that his Pepcid does not seem to be working as he is having very bad heartburn.  He is wondering if there is an alternative or should he go back on Omeprazole?  Sending to all as Pershing Proud is in the MICU this week.  Thanks,  Harriett Sine

## 2020-02-22 DIAGNOSIS — K8689 Other specified diseases of pancreas: Principal | ICD-10-CM

## 2020-02-22 MED ORDER — OMEPRAZOLE 20 MG TABLET,DELAYED RELEASE
ORAL_TABLET | Freq: Every day | ORAL | 3 refills | 90 days | Status: CP
Start: 2020-02-22 — End: 2021-02-21

## 2020-02-27 NOTE — Unmapped (Incomplete)
It was a pleasure to see you today!   

## 2020-02-27 NOTE — Unmapped (Unsigned)
Chief Complaint:     34 y.o. male with a PMH of CF, HTN w CFRD.   No chief complaint on file.    Subjective:     HPI   CFRD dx'd ~2/19.       Last seen by myself 04/23/2019 (telemed virtual d/t covid pandemic)  At last visit:   ??? Blood sugars very stable overnight, some spikes post meal  ??? Intensify the high blood sugar alarm currently at 390 reduce it to 250-290 range   ??? If spikes post meal increase base Novolog from 30-40 to 35-45 and take 10-20 min before eating  ??? See you in 3 mo, A1c is ordered for you  ??? Eye exam as schedule in Sept :)    Since last visit:     Currently taking the following for CFRD:   Basaglar Lantus insulin 60 units qhs  Aspart insulin ~30 -40  units ac meals, 8 units ac snacks + 4:50>150    Denies symptoms consistent with peripheral or autonomic neuropathy  Denies recent lows requiring assistance  Denies recent nocturnal lows  DFE scheduled setp 21 local      ________________________________________      Since last visit:   My Video call awakened him this AM!  Been feeling well, feels diet could be better, maybe less fast food  Dropped basaglar to 60, taking 30-40 Novolog ac meal  hospitalizeed a few weeks August [facility] for CF exacerbation, was followed remotely by endo svc (I don't see chart notes this is his recall)  Popcorn chicken and french fries last night caused hyperglycemia last night  Oatmeal for breakfast, will have this soon  Vision blurred, no floaters or spots  A1c ordered and I see order but not run during latest hospital stay, unclear why    Currently taking the following for CFRD:   Basaglar Lantus insulin 60 units qhs  Aspart insulin ~30 -40  units ac meals, 8 units ac snacks + 4:50>150    Denies symptoms consistent with peripheral or autonomic neuropathy  Denies recent lows requiring assistance  Denies recent nocturnal lows  DFE scheduled setp 21 local      Continuous glucose monitor (CGM) was computer downloaded, I personally reviewed greater than 72 hours of data and interpreted, and discussed the findings with the patient.   Data summary pasted in CMA note  14 day summary:  Mean glucose = 175 CV = 29%  TIR = 56%  % low = <1%  % high = 42%  Trend of Some prandial excursions, nocturnal hyperglycemia earlier but lately has resolved      ________________________      Initial HPI carried forward.   Patient is a 34 year old male with a PMH of CF, HTN and GAD/MDD who presents for a recurrence of CFRD.     The patient was initially treated for cystic fibrosis related diabetes (CFRD) when he received his care at [facility] with 5 units of levemir at night. Subsequently, after moving to [place], he was taken off of his diabetes medications which was approximately 3 years ago. Since then he has continued to check his blood sugar approximately three times per day. Recently, his blood sugars have been typically running 200's - 300's. He was scheduled for endocrinology assessment for CFRD and prior to today's appointment, his hemoglobin A1c was found to be 7.6%. Upon arrival to the clinic today, his POCT glucose was 306 mg/dL.     He denies any regular  hypglycemic episodes. If he does not eat much, he reports that he will occasionally dip lower than usual (approxiamtely once per week his blood glucose will run <100 mg/dL and approximately once per month his blood glucose will be <60 mg/dL). He states that recently, however, the lowest he has seen is 160 mg/dL. He endorses occasional blurriness in his vision and denies any neuropathic symptoms in his feet. He requires a new meter. His cystic fibrosis affects his lungs, sinuses and GI tract; he endorses pancreatic involvement. He does not appear malnourished. Most recently he was hospitalized in January 2019 for a CF-bronchiectasis exacerbation. He had a RUL lobectomy in August 2018. For his HTN, he takes lisinopril and carvedilol and he takes lamotrigine for his GAD and MDD.    The past medical history, surgical history, family history, social history, medications and allergies were reviewed in Epic.    Past Medical History:   Diagnosis Date   ??? Anxiety    ??? Chronic pain disorder    ??? Cystic fibrosis (CMS-HCC)    ??? Depression    ??? Hypertension    ??? Nonproductive cough 04/05/2018     Past Surgical History:   Procedure Laterality Date   ??? IR INSERT PORT AGE GREATER THAN 5 YRS  03/27/2019    IR INSERT PORT AGE GREATER THAN 5 YRS 03/27/2019 Rush Barer, MD IMG [facility]IR [facility]   ??? PR REMOVAL OF LUNG,LOBECTOMY Right 03/29/2017    Procedure: REMOVAL OF LUNG, OTHER THAN PNEUMONECTOMY; SINGLE LOBE (LOBECTOMY);  Surgeon: Cherie Dark, MD;  Location: MAIN OR [facility];  Service: Thoracic     Current Outpatient Medications on File Prior to Visit   Medication Sig Dispense Refill   ??? albuterol (PROVENTIL HFA;VENTOLIN HFA) 90 mcg/actuation inhaler Inhale 2 puffs every six (6) hours as needed. 1 Inhaler 1   ??? albuterol 2.5 mg/0.5 mL nebulizer solution Inhale 0.5 mL (2.5 mg total) by nebulization Two (2) times a day. 180 each 3   ??? amitriptyline (ELAVIL) 100 MG tablet TAKE ONE TABLET BY MOUTH AT BEDTIME 30 tablet 1   ??? atorvastatin (LIPITOR) 20 MG tablet Take 20 mg by mouth nightly.     ??? BETHKIS 300 mg/4 mL Nebu Inhale 1 vial via nebulization every 12 hours for 28 days, alternating every other 28 days 224 mL 6   ??? blood sugar diagnostic (GLUCOSE BLOOD) Strp by Other route Three (3) times a day with a meal. Rx sent to [facility] 01/04/20     ??? blood-glucose meter kit Dispense meter that is preferred by patient's insurance company 1 each 0   ??? blood-glucose meter,continuous (DEXCOM G6 RECEIVER) Misc Use as directed 1 each 0   ??? blood-glucose sensor (DEXCOM G6 SENSOR) Devi Use sq as directed every 14 days 6 Device 11   ??? blood-glucose transmitter (DEXCOM G6 TRANSMITTER) Devi Use as directed 1 Device 11   ??? budesonide-formoteroL (SYMBICORT) 160-4.5 mcg/actuation inhaler Inhale 2 puffs Two (2) times a day. 3 Inhaler 3   ??? cetirizine (ZYRTEC) 10 MG tablet TAKE ONE TABLET BY MOUTH AT LUNCH 90 tablet 3   ??? CREON 36,000-114,000- 180,000 unit CpDR TAKE 8 CAPSULES BY MOUTH 3 TIMES DAILY WITH MEALS AND TAKE 4 CAPSULES TWICE DAILY WITH SNACKS *MAY TAKE UP TO 1 EXTRA SNACK DAILY. MAX 36 PER DAY* 1200 capsule 11   ??? dornase alfa (PULMOZYME) 1 mg/mL nebulizer solution Inhale 2.5 mg Two (2) times a day. 450 mL 3   ??? EASY TOUCH  LANCING DEVICE Misc Use as directed.     ??? EASY TOUCH TWIST LANCETS 30 gauge Misc Use as directed.     ??? elexacaftor-tezacaftor-ivacaft (TRIKAFTA) tablet Take 2 Tablets (orange) by mouth in the morning and 1 tablet (blue) in the evening with fatty food 84 tablet 2   ??? famotidine (PEPCID) 20 MG tablet Take 1 tablet (20 mg total) by mouth daily. 30 tablet 5   ??? FLUoxetine (PROZAC) 40 MG capsule Take 40 mg by mouth daily.      ??? fluticasone propionate (FLONASE) 50 mcg/actuation nasal spray USE 1 SPRAY IN EACH NOSTRIL EVERY DAY 16 g 11   ??? gabapentin (NEURONTIN) 600 MG tablet Take 600 mg by mouth Three (3) times a day as needed.     ??? insulin ASPART (NOVOLOG FLEXPEN) 100 unit/mL (3 mL) injection pen Use up to 150 units/day, divided 3 TIMES DAILY BEFORE MEALS 135 mL 1   ??? insulin glargine (BASAGLAR, LANTUS) 100 unit/mL (3 mL) injection pen Inject 0.7 mL (70 Units total) under the skin daily. (Patient taking differently: Inject 70 Units under the skin nightly. ) 30 mL 0   ??? lamoTRIgine (LAMICTAL) 200 MG tablet Take 200 mg by mouth daily.     ??? lisinopriL (PRINIVIL,ZESTRIL) 10 MG tablet Take 1 tablet (10 mg total) by mouth daily. 30 tablet 0   ??? melatonin 10 mg Tab Take 10 mg by mouth nightly.     ??? montelukast (SINGULAIR) 10 mg tablet Take 1 tablet (10 mg total) by mouth nightly. 90 tablet 3   ??? mupirocin (BACTROBAN) 2 % ointment Apply inside of both nostrils and to other affected areas twice daily. 22 g 0   ??? MVW COMPLETE FORMUL PROBIOTIC 40 billion cell -15 mg CpDR Take 1 capsule by mouth daily. (Patient taking differently: Take 1 capsule by mouth Two (2) times a day. ) 90 capsule 3   ??? MVW Complete, pediatric multivit 61-D3-vit K, 1,500-800 unit-mcg cap Take 2 capsules by mouth daily. (Patient taking differently: Take 1 capsule by mouth Two (2) times a day. ) 60 capsule 11   ??? nebulizers Misc Use as directed with inhaled medications 4 each 3   ??? omeprazole 20 mg tablet Take 1 tablet (20 mg total) by mouth daily. 90 tablet 3   ??? pen needle, diabetic (BD ULTRA-FINE NANO PEN NEEDLE) 32 gauge x 5/32 (4 mm) Ndle use up to 4 times daily 400 each 12   ??? pramipexole (MIRAPEX) 0.125 MG tablet Take 0.25 mg by mouth every evening.      ??? sod bicarb-sod chlor-neti pot pkdv 1 each by sinus irrigation route two (2) times a day. 50 each 11   ??? sodium chloride 7% 7 % Nebu Inhale 4 mL by nebulization Two (2) times a day. Increase to 4 times while taking antibiotics     ??? traZODone (DESYREL) 100 MG tablet Take 1 tablet (100 mg total) by mouth nightly. 30 tablet 0   ??? XARELTO 20 mg tablet Take 20 mg by mouth daily.       No current facility-administered medications on file prior to visit.     SOCIAL HISTORY:  Social History     Social History Narrative   ??? Not on file       FAMILY HISTORY:  Family History   Problem Relation Age of Onset   ??? Bipolar disorder Mother    ??? Depression Mother      ROS:  The balance of 10 systems  was reviewed and unremarkable except as stated above.       ***  Objective:   There were no vitals taken for this visit.    Alert and oriented with appropriate mood and affect, appears well and in no distress and in good spirits on video  Remainder not done today d/t virtual visit  Carrying previous exam forward for future reference:  [NECK: Supple without carotid bruits, thyromegaly or lymphadenopathy.   HEART: Regular rate and rhythm without murmur, rub, gallop.  ABDOMEN: Soft and nontender without masses.   EXTREMITIES: Showed no edema    REVIEW OF RECENT PERTINENT LABORATORY AND IMAGING DATA:    Lab Results   Component Value Date    A1C 9.4 (H) 01/18/2020    A1C 8.8 (H) 09/29/2019    A1C 8.5 (H) 08/12/2018    A1C 10.7 (H) 04/06/2018    A1C 9.0 (H) 01/07/2018    A1C 6.1 (H) 11/19/2016    A1C 6.3 (H) 11/18/2016     Lab Results   Component Value Date    CREATININE 0.89 01/18/2020     Lab Results   Component Value Date    GFRNONAA 114 01/18/2020     Lab Results   Component Value Date    GFRAA 132 01/18/2020     Lab Results   Component Value Date    K 4.7 01/18/2020     No results found for: CHOL  No results found for: LDL  No results found for: HDL  No results found for: TRIG  Lab Results   Component Value Date    ALT 31 01/18/2020     No results found for: LABCREA, ALBQTUR, MSHCGMOM, ALBCRERAT  No results found for: VITAMINB12  Lab Results   Component Value Date    TSH 2.120 11/18/2016     Lab Results   Component Value Date    WBC 6.2 10/13/2019    HGB 12.7 (L) 10/13/2019    HCT 38.4 10/13/2019    PLT 335 10/13/2019     Lab Results   Component Value Date    VITDTOTAL 27.9 03/19/2017     Lab Results   Component Value Date    CALCIUM 9.4 01/18/2020       Assessment/Plan:   1. Diabetes mellitus related to CF (cystic fibrosis) (CMS-HCC)  -He's doing well w insulin dosing and CGM, latest CGM data implies basal insulin dose is appropriate given recent stability overnight  -Hyperglycemia on CGM, A1c ordered and I see order but not run during latest hospital stay, unclear why  -Previously discussed details of pump therapy, he'd be a good candidate, might benefit from CHO counting for prandial dosing, not critical in my opinion however given ~glycemic stability  Patient Instructions   It was a pleasure to see you today!  ??? Blood sugars very stable overnight, some spikes post meal  ??? Intensify the high blood sugar alarm currently at 390 reduce it to 250-290 range   ??? If spikes post meal increase base Novolog from 30-40 to 35-45 and take 10-20 min before eating  ??? See you in 3 mo, A1c is ordered for you  ??? Eye exam as schedule in Sept :)    2. Encounter for long-term (current) use of insulin (CMS-HCC)  As above    3. Obesity  Noted, encouraged dietary efforts, though need to balance against potential for malabsorption/caloric deficits related to CF    Follow up recommended in about 3 mo, he is aware to  contact us or return sooner for interim concerns.

## 2020-02-29 ENCOUNTER — Ambulatory Visit: Admit: 2020-02-29 | Payer: PRIVATE HEALTH INSURANCE | Attending: "Endocrinology | Primary: "Endocrinology

## 2020-03-04 ENCOUNTER — Ambulatory Visit: Payer: Managed Care, Other (non HMO) | Admitting: Rheumatology

## 2020-03-14 NOTE — Unmapped (Signed)
[facility] Refill Coordination Note    Specialty Medication(s) to be Shipped:   CF/Pulmonary: -Trikafta  Other medication(s) to be shipped: No additional medications requested for fill at this time     Christian Young, DOB: [birth year]  Phone: [phone] (home)     All above HIPAA information was verified with patient.     Was a Nurse, learning disability used for this call? No    Completed refill call assessment today to schedule patient's medication shipment from the [facility] [phone]).       Specialty medication(s) and dose(s) confirmed: Regimen is correct and unchanged.   Changes to medications: Christian Young reports no changes at this time.  Changes to insurance: No  Questions for the pharmacist: No    Confirmed patient received Welcome Packet with first shipment. The patient will receive a drug information handout for each medication shipped and additional FDA Medication Guides as required.       DISEASE/MEDICATION-SPECIFIC INFORMATION        For CF patients: CF [facility] Active? Yes , **HWG active VST til 05/06/20 (Both Swept- Emailed HWG)**    SPECIALTY MEDICATION ADHERENCE     Medication Adherence    Patient reported X missed doses in the last month: 0  Specialty Medication: Trikafta  Patient is on additional specialty medications: No  Informant: patient  Reliability of informant: reliable  Support network for adherence: family member        Trikafta: 10+ days of medicine on hand     SHIPPING     Shipping address confirmed in Epic.     Delivery Scheduled: Yes, Expected medication delivery date: 03/17/2020.     Medication will be delivered via UPS to the prescription address in Epic WAM.    Christian Young Shared [facility] Specialty Technician

## 2020-03-16 MED FILL — TRIKAFTA 100-50-75 MG (D)/150 MG (N) TABLETS: 28 days supply | Qty: 84 | Fill #1 | Status: AC

## 2020-03-16 MED FILL — TRIKAFTA 100-50-75 MG (D)/150 MG (N) TABLETS: 28 days supply | Qty: 84 | Fill #1

## 2020-03-31 ENCOUNTER — Ambulatory Visit: Payer: Managed Care, Other (non HMO) | Admitting: Rheumatology

## 2020-04-15 NOTE — Unmapped (Signed)
The [facility] has made a third and final attempt to reach this patient to refill the following medication: Trikafta 100mg .      We have left voicemails on the following phone numbers: [phone] and have sent a MyChart message   .    Dates contacted: 8/25, 8/30, 9/3  Last scheduled delivery: 8/5    The patient may be at risk of non-compliance with this medication. The patient should call the [facility] at [phone] (option 4) to refill medication.    Julianne Rice   [facility] Shared [facility] Specialty Pharmacist

## 2020-04-25 MED ORDER — INSULIN ASPART (U-100) 100 UNIT/ML (3 ML) SUBCUTANEOUS PEN
1 refills | 0 days
Start: 2020-04-25 — End: ?

## 2020-04-26 MED ORDER — INSULIN ASPART (U-100) 100 UNIT/ML (3 ML) SUBCUTANEOUS PEN
0 refills | 0.00000 days
Start: 2020-04-26 — End: ?

## 2020-05-16 DIAGNOSIS — J019 Acute sinusitis, unspecified: Principal | ICD-10-CM

## 2020-05-16 MED ORDER — CIPROFLOXACIN 750 MG TABLET
ORAL_TABLET | Freq: Two times a day (BID) | ORAL | 0 refills | 14.00000 days | Status: CP
Start: 2020-05-16 — End: 2020-05-30

## 2020-05-16 MED ORDER — DOXYCYCLINE HYCLATE 100 MG CAPSULE
ORAL_CAPSULE | Freq: Two times a day (BID) | ORAL | 0 refills | 14.00000 days | Status: CP
Start: 2020-05-16 — End: 2020-05-30

## 2020-05-16 NOTE — Unmapped (Signed)
Christian Young unable to make today's appointment, as he had forgotten about it and his wife is at work with their car. He does note that isn't feeling too well. Requested that he find his home spirometer and to use it between this call and when I returned his call.     Date of symptom onset:  Symptoms started last Tuesday or Wednesday, with a lot of ear pain, sinus pressure, and a lot of HAs. What is coming out of his nose is green and nasty.    When speaking with Cartel's wife, she notes symptoms of a sinus infection, with headaches, sinus pressure in his face and ear pain. Jadier has not been checked for COVID, Denies muscle aches, fevers. Denies malaise. Does noted that a coworker has been sniffling but claims it is allergies.     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    Nightsweats:    no   Change in appetite:   no   Change in Energy Level:  no, working 40+ hours, so hard to determine       Pulmonary Symptoms:   Increased cough:   no, no cough since starting Trikafta    Chest pain    no   Chest tightness   no   Wheezing:     no   Short of breath with activity:     no    Sinus Symptoms:  Sinus congestion and blowing of nose, Drainage in green in color and nasty.   Post-nasal drip with throat clearing and sore throat.      Sinus Drainage:   yes   Sinus Pressure:   yes, pressure in jaw, behind eyes and outside of ears, forehead, constant ache, especially in jaw.    Performing Nasal Irrigations:   yes, Neil-Med Squeeze bottle twice a day    Using Nasal Steroid:   yes, generic version of Flonase    Allergy Medications:   yes, Zyrtec and Singulair     Neb/Airway Clearance:     Compliant        Home Spirometer:   FEV1 72%           Preferred Pharmacy:   [facility] in [place]. [address].     Vaccinated for COVID.    Note routed to Dr [name].    [name], RN  CF Nurse Coordinator   [phone]

## 2020-05-16 NOTE — Unmapped (Signed)
[facility] Clinical Assessment & Refill Coordination Note    Patient said he will renew HWG on his own     Christian Young, DOB: [birth year]  Phone: [phone] (home)     All above HIPAA information was verified with patient.     Was a Nurse, learning disability used for this call? No    Specialty Medication(s):   CF/Pulmonary: -Trikafta     Current Outpatient Medications   Medication Sig Dispense Refill   ??? albuterol (PROVENTIL HFA;VENTOLIN HFA) 90 mcg/actuation inhaler Inhale 2 puffs every six (6) hours as needed. 1 Inhaler 1   ??? albuterol 2.5 mg/0.5 mL nebulizer solution Inhale 0.5 mL (2.5 mg total) by nebulization Two (2) times a day. 180 each 3   ??? amitriptyline (ELAVIL) 100 MG tablet TAKE ONE TABLET BY MOUTH AT BEDTIME 30 tablet 1   ??? atorvastatin (LIPITOR) 20 MG tablet Take 20 mg by mouth nightly.     ??? BETHKIS 300 mg/4 mL Nebu Inhale 1 vial via nebulization every 12 hours for 28 days, alternating every other 28 days 224 mL 6   ??? blood sugar diagnostic (GLUCOSE BLOOD) Strp by Other route Three (3) times a day with a meal. Rx sent to [facility] 01/04/20     ??? blood-glucose meter kit Dispense meter that is preferred by patient's insurance company 1 each 0   ??? blood-glucose meter,continuous (DEXCOM G6 RECEIVER) Misc Use as directed 1 each 0   ??? blood-glucose sensor (DEXCOM G6 SENSOR) Devi Use sq as directed every 14 days 6 Device 11   ??? blood-glucose transmitter (DEXCOM G6 TRANSMITTER) Devi Use as directed 1 Device 11   ??? budesonide-formoteroL (SYMBICORT) 160-4.5 mcg/actuation inhaler Inhale 2 puffs Two (2) times a day. 3 Inhaler 3   ??? cetirizine (ZYRTEC) 10 MG tablet TAKE ONE TABLET BY MOUTH AT LUNCH 90 tablet 3   ??? CREON 36,000-114,000- 180,000 unit CpDR TAKE 8 CAPSULES BY MOUTH 3 TIMES DAILY WITH MEALS AND TAKE 4 CAPSULES TWICE DAILY WITH SNACKS *MAY TAKE UP TO 1 EXTRA SNACK DAILY. MAX 36 PER DAY* 1200 capsule 11   ??? dornase alfa (PULMOZYME) 1 mg/mL nebulizer solution Inhale 2.5 mg Two (2) times a day. 450 mL 3   ??? EASY TOUCH LANCING DEVICE Misc Use as directed.     ??? EASY TOUCH TWIST LANCETS 30 gauge Misc Use as directed.     ??? elexacaftor-tezacaftor-ivacaft (TRIKAFTA) 100-50-75 mg(d) /150 mg (n) tablet Take 2 Tablets (orange) by mouth in the morning and 1 tablet (blue) in the evening with fatty food 84 tablet 2   ??? famotidine (PEPCID) 20 MG tablet Take 1 tablet (20 mg total) by mouth daily. 30 tablet 5   ??? FLUoxetine (PROZAC) 40 MG capsule Take 40 mg by mouth daily.      ??? fluticasone propionate (FLONASE) 50 mcg/actuation nasal spray USE 1 SPRAY IN EACH NOSTRIL EVERY DAY 16 g 11   ??? gabapentin (NEURONTIN) 600 MG tablet Take 600 mg by mouth Three (3) times a day as needed.     ??? insulin ASPART (NOVOLOG FLEXPEN) 100 unit/mL (3 mL) injection pen Use up to 150 units/day, divided 3 TIMES DAILY BEFORE MEALS 45 mL 0   ??? insulin glargine (BASAGLAR, LANTUS) 100 unit/mL (3 mL) injection pen Inject 0.7 mL (70 Units total) under the skin daily. (Patient taking differently: Inject 70 Units under the skin nightly. ) 30 mL 0   ??? lamoTRIgine (LAMICTAL) 200 MG tablet Take 200  mg by mouth daily.     ??? lisinopriL (PRINIVIL,ZESTRIL) 10 MG tablet Take 1 tablet (10 mg total) by mouth daily. 30 tablet 0   ??? melatonin 10 mg Tab Take 10 mg by mouth nightly.     ??? montelukast (SINGULAIR) 10 mg tablet Take 1 tablet (10 mg total) by mouth nightly. 90 tablet 3   ??? mupirocin (BACTROBAN) 2 % ointment Apply inside of both nostrils and to other affected areas twice daily. 22 g 0   ??? MVW COMPLETE FORMUL PROBIOTIC 40 billion cell -15 mg CpDR Take 1 capsule by mouth daily. (Patient taking differently: Take 1 capsule by mouth Two (2) times a day. ) 90 capsule 3   ??? MVW Complete, pediatric multivit 61-D3-vit K, 1,500-800 unit-mcg cap Take 2 capsules by mouth daily. (Patient taking differently: Take 1 capsule by mouth Two (2) times a day. ) 60 capsule 11   ??? nebulizers Misc Use as directed with inhaled medications 4 each 3   ??? omeprazole 20 mg tablet Take 1 tablet (20 mg total) by mouth daily. 90 tablet 3   ??? pen needle, diabetic (BD ULTRA-FINE NANO PEN NEEDLE) 32 gauge x 5/32 (4 mm) Ndle use up to 4 times daily 400 each 12   ??? pramipexole (MIRAPEX) 0.125 MG tablet Take 0.25 mg by mouth every evening.      ??? sod bicarb-sod chlor-neti pot pkdv 1 each by sinus irrigation route two (2) times a day. 50 each 11   ??? sodium chloride 7% 7 % Nebu Inhale 4 mL by nebulization Two (2) times a day. Increase to 4 times while taking antibiotics     ??? traZODone (DESYREL) 100 MG tablet Take 1 tablet (100 mg total) by mouth nightly. 30 tablet 0   ??? XARELTO 20 mg tablet Take 20 mg by mouth daily.       No current facility-administered medications for this visit.        Changes to medications: Gemini reports no changes at this time.    Allergies   Allergen Reactions   ??? [place] [Aztreonam Lysine] Anaphylaxis   ??? Cefepime Itching and Nausea Only   ??? Other Anaphylaxis and Other (See Comments)     Other reaction(s): Other (See Comments)  Bananas: itchy throat  Slo Bid record from [employer] states anaphylaxis.????Pt states this was from childhood and does not know reaction.  Bananas, causes itchy throat   ??? Slo-Bid 100 Anaphylaxis   ??? Banana Itching and Nausea And Vomiting   ??? Tobramycin Tinnitus     From OSH record-documented as tinnitus but has received IV tobra with close monitoring.       Changes to allergies: No    SPECIALTY MEDICATION ADHERENCE     Trikafta 100 mg: 4 days of medicine on hand       Medication Adherence    Specialty Medication: Trikafta 100mg    Patient is on additional specialty medications: No  Informant: patient  Support network for adherence: family member          Specialty medication(s) dose(s) confirmed: Regimen is correct and unchanged.     Are there any concerns with adherence? No    Adherence counseling provided? Not needed    CLINICAL MANAGEMENT AND INTERVENTION      Clinical Benefit Assessment:    Do you feel the medicine is effective or helping your condition? Yes    Clinical Benefit counseling provided? Not needed    Adverse Effects Assessment:  Are you experiencing any side effects? No    Are you experiencing difficulty administering your medicine? No    Quality of Life Assessment:    How many days over the past month did your CF  keep you from your normal activities? For example, brushing your teeth or getting up in the morning. 0    Have you discussed this with your provider? Not needed    Therapy Appropriateness:    Is therapy appropriate? Yes, therapy is appropriate and should be continued    DISEASE/MEDICATION-SPECIFIC INFORMATION      For CF patients: CF [facility] Active? No-Expired on 05/06/20, reminded to renew online/messaged CPP    PATIENT SPECIFIC NEEDS     - Does the patient have any physical, cognitive, or cultural barriers? No    - Is the patient high risk? No    - Does the patient require a Care Management Plan? No     - Does the patient require physician intervention or other additional services (i.e. nutrition, smoking cessation, social work)? No      SHIPPING     Specialty Medication(s) to be Shipped:   CF/Pulmonary: -Trikafta    Other medication(s) to be shipped: No additional medications requested for fill at this time     Changes to insurance: No    Delivery Scheduled: Yes, Expected medication delivery date: 10/7. --giving some time for HWG to renew but ok with $15 copay otherwise     Medication will be delivered via UPS to the confirmed prescription address in [facility].    The patient will receive a drug information handout for each medication shipped and additional FDA Medication Guides as required.  Verified that patient has previously received a Conservation officer, historic buildings.    All of the patient's questions and concerns have been addressed.    Julianne Rice   [facility] Specialty Pharmacist

## 2020-05-16 NOTE — Unmapped (Signed)
Noted that patient could not make it to his appointment today as he forgot. However after we called him, we found that he was also not feeling very well. He has been reporting sinus tenderness on pressure, sinus headaches, nasal and sinus congestion with postnasal drip symptoms without any fevers, jaw pain present, some body ache and malaise as well. Denies any Covid exposures however reports exposure to somebody sick at work however they have allergies.    Given that this seems very likely to be an acute upper respiratory/sinus infection, will treat him with antibiotics and follow him up in 2 weeks in clinic which is already been set up.    He has grown mucoid and smooth Pseudomonas with intermediate sensitivities to fluoroquinolones and MSSA. We will treat him with ciprofloxacin and doxycycline for 14 days. Counseled regarding side effects of ciprofloxacin and doxycycline given during the phone call.      Maia Plan, MD  PGY 6, Pulmonary and Critical Care  Pager: [phone]  May 16, 2020 1:52 PM

## 2020-05-18 MED FILL — TRIKAFTA 100-50-75 MG (D)/150 MG (N) TABLETS: 28 days supply | Qty: 84 | Fill #2 | Status: AC

## 2020-05-18 MED FILL — TRIKAFTA 100-50-75 MG (D)/150 MG (N) TABLETS: 28 days supply | Qty: 84 | Fill #2

## 2020-05-19 NOTE — Unmapped (Signed)
Patient called and left message with the CF nurse coordinator noted he had a close contact at work who has tested positive for COVID.     Returned Christian Young's call. Neither were wearing masks. Likely the coworker tested positive on Monday. Encouraged him to get tested locally (PCR) and to notify the team of the results, as he would be eligible for monoclonal antibody infusion. Also advised that he may need to be retested if negative today.     Note routed to Dr [name].     [name], RN  CF Nurse Coordinator   [phone]

## 2020-05-23 NOTE — Unmapped (Signed)
Ohm left a voicemail on this CF nurse coordinator's line on Saturday morning. He reports receiving in his COVID test and it was negative, and that everything is good.

## 2020-05-24 DIAGNOSIS — J324 Chronic pansinusitis: Principal | ICD-10-CM

## 2020-05-24 MED ORDER — BUDESONIDE-FORMOTEROL HFA 160 MCG-4.5 MCG/ACTUATION AEROSOL INHALER
Freq: Two times a day (BID) | RESPIRATORY_TRACT | 3 refills | 46.00000 days | Status: CP
Start: 2020-05-24 — End: 2021-05-24

## 2020-05-24 MED ORDER — MONTELUKAST 10 MG TABLET
ORAL_TABLET | 3 refills | 0 days | Status: CP
Start: 2020-05-24 — End: ?

## 2020-05-24 MED ORDER — INSULIN ASPART (U-100) 100 UNIT/ML (3 ML) SUBCUTANEOUS PEN
0 refills | 0 days
Start: 2020-05-24 — End: ?

## 2020-05-25 MED ORDER — DEXCOM G6 SENSOR DEVICE
11 refills | 0 days | Status: CP
Start: 2020-05-25 — End: ?

## 2020-05-25 MED ORDER — INSULIN ASPART (U-100) 100 UNIT/ML (3 ML) SUBCUTANEOUS PEN
0 refills | 0.00000 days | Status: CP
Start: 2020-05-25 — End: ?

## 2020-05-25 MED ORDER — DEXCOM G6 TRANSMITTER DEVICE
11 refills | 0.00000 days | Status: CP
Start: 2020-05-25 — End: ?

## 2020-06-13 MED ORDER — TRIKAFTA 100-50-75 MG (D)/150 MG (N) TABLETS
ORAL_TABLET | 0 refills | 0 days | Status: CP
Start: 2020-06-13 — End: 2020-07-12
  Filled 2020-06-15: qty 84, 28d supply, fill #0

## 2020-06-13 NOTE — Unmapped (Signed)
Adult C[facility] Fibrosis Clinic Pharmacist Note      Received prescription renewal request for Christian Young. Due for quarterly LFTs. Will plan to get labs at patient's follow up visit scheduled on 06/27/20.     1. Cystic fibrosis (CMS-HCC)    - elexacaftor-tezacaftor-ivacaft (TRIKAFTA) 100-50-75 mg(d) /150 mg (n) tablet; Take 2 Tablets (orange) by mouth in the morning and 1 tablet (blue) in the evening with fatty food  Dispense: 84 tablet; Refill: 0    Pharmacy sent to:  [facility]    Electronically signed by:  Barbette Hair, PharmD, MPH, BCPS, CPP  Clinical Pharmacist Practitioner  [facility]  [phone]

## 2020-06-13 NOTE — Unmapped (Signed)
[facility] Refill Coordination Note    Specialty Medication(s) to be Shipped:   CF/Pulmonary: -Trikafta  Other medication(s) to be shipped: No additional medications requested for fill at this time     Christian Young, DOB: [birth year]  Phone: [phone] (home)     All above HIPAA information was verified with patient.     Was a Nurse, learning disability used for this call? No    Completed refill call assessment today to schedule patient's medication shipment from the [facility] [phone]).       Specialty medication(s) and dose(s) confirmed: Regimen is correct and unchanged.   Changes to medications: [name] reports no changes at this time.  Changes to insurance: No  Questions for the pharmacist: No    Confirmed patient received Welcome Packet with first shipment. The patient will receive a drug information handout for each medication shipped and additional FDA Medication Guides as required.       DISEASE/MEDICATION-SPECIFIC INFORMATION        For CF patients: CF Healthwell [facility] Active? Yes, **HWG active VST til 05/06/21**    SPECIALTY MEDICATION ADHERENCE     Medication Adherence    Patient reported X missed doses in the last month: 0  Specialty Medication: Trikafta  Patient is on additional specialty medications: No  Informant: patient  Reliability of informant: reliable  Support network for adherence: family member        Trikafta: 14 days of medicine on hand     SHIPPING     Shipping address confirmed in Epic.     Delivery Scheduled: Yes, Expected medication delivery date: 06/16/2020.     Medication will be delivered via UPS to the prescription address in Epic [facility].    Christian Young P [name] [facility] Specialty Technician

## 2020-06-15 MED FILL — TRIKAFTA 100-50-75 MG (D)/150 MG (N) TABLETS: 28 days supply | Qty: 84 | Fill #0 | Status: AC

## 2020-06-16 NOTE — Unmapped (Signed)
[facility]    Home Spirometry Monitoring      06/16/20        I have reviewed 1 home spirometry test(s) over prior calendar month.  Most recent test demonstrates mild obstruction and mild restriction.  Compared to prior home spirometry values, the most recent test is better. Best in-clinic FEV1 in last year was 3.23 L (68%) on 10/08/19.         Quality of most recent test graded A.     Follow-up plan:   ??? Reached out to patient via MyChart.  ??? Continue monthly home spirometry monitoring for the management of cystic fibrosis.    Durenda Hurt [place], PA-C  [facility]   [phone]

## 2020-06-23 MED ORDER — INSULIN ASPART (U-100) 100 UNIT/ML (3 ML) SUBCUTANEOUS PEN
0 refills | 0 days
Start: 2020-06-23 — End: ?

## 2020-06-27 DIAGNOSIS — Z5181 Encounter for therapeutic drug level monitoring: Principal | ICD-10-CM

## 2020-06-27 NOTE — Unmapped (Signed)
Trip rescheduled today's appointment to January. He is however due for his labs as part of his monitoring on Trikafta.  He was agreeable to having them drawn at his local PCP's office, [facility]. Lab ordered and  faxed to [phone].  Receipt of fax received.     MyChart message sent to [place] with the above request/plan.    [name], RN  CF Nurse Coordinator   [phone]

## 2020-06-28 ENCOUNTER — Ambulatory Visit
Admit: 2020-06-28 | Discharge: 2020-07-04 | Disposition: A | Discharge status: Home / Self Care | Payer: PRIVATE HEALTH INSURANCE

## 2020-06-28 DIAGNOSIS — E869 Volume depletion, unspecified: Principal | ICD-10-CM

## 2020-06-28 DIAGNOSIS — Z87891 Personal history of nicotine dependence: Principal | ICD-10-CM

## 2020-06-28 DIAGNOSIS — E1165 Type 2 diabetes mellitus with hyperglycemia: Principal | ICD-10-CM

## 2020-06-28 DIAGNOSIS — Z20822 Contact with and (suspected) exposure to covid-19: Principal | ICD-10-CM

## 2020-06-28 DIAGNOSIS — J471 Bronchiectasis with (acute) exacerbation: Principal | ICD-10-CM

## 2020-06-28 DIAGNOSIS — Z6831 Body mass index (BMI) 31.0-31.9, adult: Principal | ICD-10-CM

## 2020-06-28 DIAGNOSIS — F419 Anxiety disorder, unspecified: Principal | ICD-10-CM

## 2020-06-28 DIAGNOSIS — K8689 Other specified diseases of pancreas: Principal | ICD-10-CM

## 2020-06-28 DIAGNOSIS — Z794 Long term (current) use of insulin: Principal | ICD-10-CM

## 2020-06-28 DIAGNOSIS — F32A Depression, unspecified: Principal | ICD-10-CM

## 2020-06-28 DIAGNOSIS — I110 Hypertensive heart disease with heart failure: Principal | ICD-10-CM

## 2020-06-28 DIAGNOSIS — G2581 Restless legs syndrome: Principal | ICD-10-CM

## 2020-06-28 DIAGNOSIS — Z91018 Allergy to other foods: Principal | ICD-10-CM

## 2020-06-28 DIAGNOSIS — R058 Other specified cough: Principal | ICD-10-CM

## 2020-06-28 DIAGNOSIS — K8681 Exocrine pancreatic insufficiency: Principal | ICD-10-CM

## 2020-06-28 DIAGNOSIS — Z902 Acquired absence of lung [part of]: Principal | ICD-10-CM

## 2020-06-28 DIAGNOSIS — R791 Abnormal coagulation profile: Principal | ICD-10-CM

## 2020-06-28 DIAGNOSIS — I509 Heart failure, unspecified: Principal | ICD-10-CM

## 2020-06-28 DIAGNOSIS — Z7951 Long term (current) use of inhaled steroids: Principal | ICD-10-CM

## 2020-06-28 DIAGNOSIS — R918 Other nonspecific abnormal finding of lung field: Principal | ICD-10-CM

## 2020-06-28 DIAGNOSIS — E669 Obesity, unspecified: Principal | ICD-10-CM

## 2020-06-28 DIAGNOSIS — I82509 Chronic embolism and thrombosis of unspecified deep veins of unspecified lower extremity: Principal | ICD-10-CM

## 2020-06-28 DIAGNOSIS — R0600 Dyspnea, unspecified: Principal | ICD-10-CM

## 2020-06-28 LAB — CBC W/ AUTO DIFF
BASOPHILS ABSOLUTE COUNT: 0.1 10*9/L (ref 0.0–0.1)
BASOPHILS RELATIVE PERCENT: 1.4 %
EOSINOPHILS ABSOLUTE COUNT: 0.1 10*9/L (ref 0.0–0.7)
EOSINOPHILS RELATIVE PERCENT: 1.7 %
HEMATOCRIT: 39.8 % (ref 38.0–50.0)
HEMOGLOBIN: 13.3 g/dL — ABNORMAL LOW (ref 13.5–17.5)
LYMPHOCYTES ABSOLUTE COUNT: 1.7 10*9/L (ref 0.7–4.0)
LYMPHOCYTES RELATIVE PERCENT: 21 %
MEAN CORPUSCULAR HEMOGLOBIN CONC: 33.4 g/dL (ref 30.0–36.0)
MEAN CORPUSCULAR HEMOGLOBIN: 27.2 pg (ref 26.0–34.0)
MEAN CORPUSCULAR VOLUME: 81.5 fL (ref 81.0–95.0)
MEAN PLATELET VOLUME: 8.4 fL (ref 7.0–10.0)
MONOCYTES ABSOLUTE COUNT: 0.5 10*9/L (ref 0.1–1.0)
MONOCYTES RELATIVE PERCENT: 6.8 %
NEUTROPHILS ABSOLUTE COUNT: 5.4 10*9/L (ref 1.7–7.7)
NEUTROPHILS RELATIVE PERCENT: 69.1 %
NUCLEATED RED BLOOD CELLS: 0 /100{WBCs} (ref <=4)
PLATELET COUNT: 279 10*9/L (ref 150–450)
RED BLOOD CELL COUNT: 4.88 10*12/L (ref 4.32–5.72)
RED CELL DISTRIBUTION WIDTH: 14.5 % (ref 12.0–15.0)
WBC ADJUSTED: 7.9 10*9/L (ref 3.5–10.5)

## 2020-06-28 LAB — COMPREHENSIVE METABOLIC PANEL
ALBUMIN: 3.9 g/dL (ref 3.4–5.0)
ALKALINE PHOSPHATASE: 90 U/L (ref 46–116)
ALT (SGPT): 33 U/L (ref 10–49)
ANION GAP: 7 mmol/L (ref 5–14)
AST (SGOT): 23 U/L (ref <=34)
BILIRUBIN TOTAL: 0.6 mg/dL (ref 0.3–1.2)
BLOOD UREA NITROGEN: 14 mg/dL (ref 9–23)
BUN / CREAT RATIO: 20
CALCIUM: 9.5 mg/dL (ref 8.7–10.4)
CHLORIDE: 99 mmol/L (ref 98–107)
CO2: 28.8 mmol/L (ref 20.0–31.0)
CREATININE: 0.69 mg/dL
EGFR CKD-EPI AA MALE: >90 mL/min/{1.73_m2} (ref >=60)
EGFR CKD-EPI NON-AA MALE: >90 mL/min/{1.73_m2} (ref >=60)
GLUCOSE RANDOM: 234 mg/dL — ABNORMAL HIGH (ref 70–179)
POTASSIUM: 4 mmol/L (ref 3.4–4.5)
PROTEIN TOTAL: 7.5 g/dL (ref 5.7–8.2)
SODIUM: 135 mmol/L (ref 135–145)

## 2020-06-28 LAB — C-REACTIVE PROTEIN: C-REACTIVE PROTEIN: <4 mg/L (ref <=10.0)

## 2020-06-28 LAB — PROTIME-INR
INR: 1.78
PROTIME: 20.7 s — ABNORMAL HIGH (ref 10.3–13.4)

## 2020-06-28 MED ADMIN — piperacillin-tazobactam (ZOSYN) IVPB (premix) 3.375 g: 3.375 g | INTRAVENOUS | @ 20:00:00 | Stop: 2020-06-28

## 2020-06-28 MED ADMIN — MORPhine 4 mg/mL injection 4 mg: 4 mg | INTRAVENOUS | @ 20:00:00 | Stop: 2020-06-28

## 2020-06-28 MED ADMIN — MORPhine 4 mg/mL injection 4 mg: 4 mg | INTRAVENOUS | Stop: 2020-06-28

## 2020-06-28 NOTE — Unmapped (Signed)
Spoke with Christian Young on 06/28/20 after he called in yesterday feeling sick. 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     He reports feeling poorly on and off since mid-October when he was treated with oral Cipro/Doxy for a sinus infection. About a week ago, he started to feel worse with fatigue, night sweats, and increased cough. He denies fevers or chills. He denies sore throat or sinus symptoms. He has some generalized chest pain, wheezing, and his cough is at times productive. He denies any sick contacts and is fully vaccinated for COVID-19 and the flu. His appetite is stable. His blood sugars have been running high.     Plan:   - Ad[facility] to [facility] - advised patient to go to the ER as unable to directly admit at this time   - Obtain baseline labs CBC w/ diff, CMP, INR, CRP level   - Rule out COVID/Flu/RSV  - Obtain chest x-ray (PA/LAT)  - Obtain repeat CF sputum culture and AFB culture  - Consult Pulmonary to follow while the patient is in-house   - Start IV Tobramycin and IV Zosyn per prior admissions - patient has a Port for access  - Aggressive airway clearance with Albuterol, Hypertonic Saline + Aerobika/Vest QID  - Continue Trikafta, Pulmozyme and Symbicort  - Needs baseline spirometry upon admission, then weekly while in-house   - Consult Nutrition   - Would consider home IV's after 1 week if patient improving symptomatically and on PFT's     The patient is followed by Dr. [name] as an outpatient (he is unavailable today). Feel free to contact me or Dr. [name] with questions.     Durenda Hurt [place], PA-C  [facility]   [phone]

## 2020-06-28 NOTE — Unmapped (Signed)
[facility]  Emergency Department Provider Note      ED Clinical Impression      Final diagnoses:   CF (cystic fibrosis) (CMS-HCC) (Primary)   Dyspnea, unspecified type   Productive cough         Impression, ED Course, Assessment and Plan      Impression: Christian Young is a 34 y.o. 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  Patient reached out to the pulmonology team, with their recommendation being admission and specific interventions as listed below.  On initial evaluation the ED, he is alert, well-appearing, in no acute distress.  Vital signs with no fever, tachycardia, or hypoxia.  Exam with essentially clear lungs bilaterally with no focal crackles, wheezing, or rhonchi.  Exam is otherwise unremarkable detail below.  Will obtain chest x-ray, ECG and treat chest pain symptomatically as well as obtain cultures and laboratory evaluation as per pulmonology recommendations.  We will start IV tobramycin and Zosyn empirically.  Low suspicion for alternative etiology such as ACS, PE, or aortic pathology.  Will obtain full RPP with Covid.  Anticipate admission to the [facility] per pulmonology recommendation with pulmonary consult.     4:58 PM  Patient has been paged the [facility] for admission.  Signed out to Dr. Jimmey Ralph at this time.  Labs thus far with unremarkable CBC, CMP with exception of hyperglycemia to 234, INR 1.78.  CXR with bronchial wall thickening and bibasilar opacities concerning for infectious process.           Additional Medical Decision Making     I have reviewed the vital signs and the nursing notes. Labs and radiology results that were available during my care of the patient were independently reviewed by me and considered in my medical decision making.     I discussed the case with Dr. Micael Hampshire, the ED attending physician.     Portions of this record have been created using [employer]. Dictation errors have been sought, but these may not have been identified and corrected.     ____________________________________________         History        Chief Complaint  Shortness of Breath      HPI   Christian Young is a 34 y.o. 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 Per pulmonology note from earlier today, the patient has been feeling poorly since mid-October when he was treated with Cipro/Doxy for a sinus infection. Over the last week, he developed fatigue, night sweats, increased cough that is intermittently productive, wheezing, and generalized chest pain. The following plan was set in place:    - Admit to [facility] - advised patient to go to the ER as unable to directly admit at this time   - Obtain baseline labs CBC w/ diff, CMP, INR, CRP level   - Rule out COVID/Flu/RSV  - Obtain chest x-ray (PA/LAT)  - Obtain repeat CF sputum culture and AFB culture  - Consult Pulmonary  to follow while the patient is in-house   - Start IV Tobramycin and IV Zosyn per prior admissions - patient has a Port for access  - Aggressive airway clearance with Albuterol, Hypertonic Saline + Aerobika/Vest QID  - Continue Trikafta, Pulmozyme and Symbicort  - Needs baseline spirometry upon admission, then weekly while in-house   - Consult Nutrition   - Would consider home IV's after 1 week if patient improving symptomatically and on PFT's   ??       Past Medical History: Diagnosis Date   ??? Anxiety    ??? Chronic pain disorder    ??? Cystic fibrosis (CMS-HCC)    ??? Depression    ??? Hypertension    ??? Nonproductive cough 04/05/2018       Patient Active Problem List   Diagnosis   ??? Essential hypertension   ??? Depressive disorder   ??? Mood disorder (CMS-HCC)   ??? Anxiety   ??? 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 Chronic pansinusitis   ??? Pancreatic insufficiency due to cystic fibrosis (CMS-HCC)   ??? History of Mycobacterium abscessus infection   ??? Bronchiectasis (CMS-HCC)   ??? Chronic deep vein thrombosis (DVT) of lower extremity (CMS-HCC)   ??? Cystic fibrosis with pulmonary exacerbation (CMS-HCC)   ??? Cystic fibrosis (CMS-HCC)   ??? Obesity (BMI 30-39.9)   ??? Restless leg syndrome       Past Surgical History:   Procedure Laterality Date   ??? IR INSERT PORT AGE GREATER THAN 5 YRS  03/27/2019    IR INSERT PORT AGE GREATER THAN 5 YRS 03/27/2019 Rush Barer, MD IMG VIR [facility]   ??? PR REMOVAL OF LUNG,LOBECTOMY Right 03/29/2017    Procedure: REMOVAL OF LUNG, OTHER THAN PNEUMONECTOMY; SINGLE LOBE (LOBECTOMY);  Surgeon: Cherie Dark, MD;  Location: MAIN OR [facility];  Service: Thoracic         Current Facility-Administered Medications:   ???  tobramycin (NEBCIN) 260 mg in sodium chloride (NS) 0.9 % 100 mL IVPB, 260 mg, Intravenous, Q12H **AND** Inpatient consult to Pharmacy RX to dose: tobramycin, , , Once, Lisa Roca, MD    Current Outpatient Medications:   ???  albuterol (PROVENTIL HFA;VENTOLIN HFA) 90 mcg/actuation inhaler, Inhale 2 puffs every six (6) hours as needed., Disp: 1 Inhaler, Rfl: 1  ???  albuterol 2.5 mg/0.5 mL nebulizer solution, Inhale 0.5 mL (2.5 mg total) by nebulization Two (2) times a day., Disp: 180 each, Rfl: 3  ???  amitriptyline (ELAVIL) 100 MG tablet, TAKE ONE TABLET BY MOUTH AT BEDTIME, Disp: 30 tablet, Rfl: 1  ???  atorvastatin (LIPITOR) 20 MG tablet, Take 20 mg by mouth nightly., Disp: , Rfl:   ???  BETHKIS 300 mg/4 mL Nebu, Inhale 1 vial via nebulization every 12 hours for 28 days, alternating every other 28 days, Disp: 224 mL, Rfl: 6  ???  blood sugar diagnostic (GLUCOSE BLOOD) Strp, by Other route Three (3) times a day with a meal. Rx sent to [facility] 01/04/20, Disp: , Rfl:   ???  blood-glucose meter kit, Dispense meter that is preferred by [employer], Disp: 1 each, Rfl: 0  ???  blood-glucose meter,continuous (DEXCOM G6 RECEIVER) Misc, Use as directed, Disp: 1 each, Rfl: 0  ???  budesonide-formoteroL (SYMBICORT) 160-4.5 mcg/actuation inhaler, Inhale 2 puffs Two (2) times a day., Disp: 30.6 g, Rfl: 3  ???  cetirizine (ZYRTEC) 10 MG tablet, TAKE ONE TABLET BY MOUTH AT LUNCH, Disp: 90 tablet, Rfl: 3  ???  CREON 36,000-114,000- 180,000 unit CpDR, TAKE 8 CAPSULES BY MOUTH 3 TIMES DAILY WITH MEALS AND TAKE 4 CAPSULES TWICE DAILY WITH SNACKS *MAY TAKE UP TO 1 EXTRA SNACK DAILY. MAX 36 PER DAY*, Disp: 1200 capsule, Rfl: 11  ???  DEXCOM G6 SENSOR Devi, Use sq as directed every 14 days, Disp: 6 each, Rfl: 11  ???  DEXCOM G6 TRANSMITTER Devi, Use as directed, Disp: 1 each, Rfl: 11  ???  dornase alfa (PULMOZYME) 1 mg/mL nebulizer solution, Inhale 2.5 mg Two (2) times a day., Disp: 450 mL, Rfl: 3  ???  EASY TOUCH LANCING DEVICE Misc, Use as directed., Disp: , Rfl:   ???  EASY TOUCH TWIST LANCETS 30 gauge Misc, Use as directed., Disp: , Rfl:   ???  elexacaftor-tezacaftor-ivacaft (TRIKAFTA) 100-50-75 mg(d) /150 mg (n) tablet, Take 2 Tablets (orange) by mouth in the morning and 1 tablet (blue) in the evening with fatty food, Disp: 84 tablet, Rfl: 0  ???  famotidine (PEPCID) 20 MG tablet, Take 1 tablet (20 mg total) by mouth daily., Disp: 30 tablet, Rfl: 5  ???  FLUoxetine (PROZAC) 40 MG capsule, Take 40 mg by mouth daily. , Disp: , Rfl:   ???  fluticasone propionate (FLONASE) 50 mcg/actuation nasal spray, USE 1 SPRAY IN EACH NOSTRIL EVERY DAY, Disp: 16 g, Rfl: 11  ???  gabapentin (NEURONTIN) 600 MG tablet, Take 600 mg by mouth Three (3) times a day as needed., Disp: , Rfl:   ???  insulin ASPART (NOVOLOG FLEXPEN) 100 unit/mL (3 mL) injection pen, Use up to 150 units/day, divided 3 TIMES DAILY BEFORE MEALS, Disp: 45 mL, Rfl: 0  ???  insulin glargine (BASAGLAR, LANTUS) 100 unit/mL (3 mL) injection pen, Inject 0.7 mL (70 Units total) under the skin daily. (Patient taking differently: Inject 70 Units under the skin nightly. ), Disp: 30 mL, Rfl: 0  ???  lamoTRIgine (LAMICTAL) 200 MG tablet, Take 200 mg by mouth daily., Disp: , Rfl:   ???  lisinopriL (PRINIVIL,ZESTRIL) 10 MG tablet, Take 1 tablet (10 mg total) by mouth daily., Disp: 30 tablet, Rfl: 0  ???  melatonin 10 mg Tab, Take 10 mg by mouth nightly., Disp: , Rfl:   ???  montelukast (SINGULAIR) 10 mg tablet, TAKE ONE TABLET BY MOUTH AT BEDTIME, Disp: 90 tablet, Rfl: 3  ???  mupirocin (BACTROBAN) 2 % ointment, Apply inside of both nostrils and to other affected areas twice daily., Disp: 22 g, Rfl: 0  ???  MVW COMPLETE FORMUL PROBIOTIC 40 billion cell -15 mg CpDR, Take 1 capsule by mouth daily. (Patient taking differently: Take 1 capsule by mouth Two (2) times a day. ), Disp: 90 capsule, Rfl: 3  ???  MVW Complete, pediatric multivit 61-D3-vit K, 1,500-800 unit-mcg cap, Take 2 capsules by mouth daily. (Patient taking differently: Take 1 capsule by mouth Two (2) times a day. ), Disp: 60 capsule, Rfl: 11  ???  nebulizers Misc, Use as directed with inhaled medications, Disp: 4 each, Rfl: 3  ???  omeprazole 20 mg tablet, Take 1 tablet (20 mg total) by mouth daily., Disp: 90 tablet, Rfl: 3  ???  pen needle, diabetic (BD ULTRA-FINE NANO PEN NEEDLE) 32 gauge x 5/32 (4 mm) Ndle, use up to 4 times daily, Disp: 400 each, Rfl: 12  ???  pramipexole (MIRAPEX) 0.125 MG tablet, Take 0.25 mg by mouth every evening. , Disp: , Rfl:   ???  sodium chloride 7% 7 % Nebu, Inhale 4 mL by nebulization Two (2) times  a day. Increase to 4 times while taking antibiotics, Disp: , Rfl:   ???  traZODone (DESYREL) 100 MG tablet, Take 1 tablet (100 mg total) by mouth nightly., Disp: 30 tablet, Rfl: 0  ???  XARELTO 20 mg tablet, Take 20 mg by mouth daily., Disp: , Rfl:     Allergies  Cayston [aztreonam lysine], Cefepime, Other, Slo-bid 100, Banana, and Tobramycin    Family History   Problem Relation Age of Onset   ??? Bipolar disorder Mother    ??? Depression Mother        Social History  Social History     Tobacco Use   ??? Smoking status: Former Smoker     Types: e-Cigarettes   ??? Smokeless tobacco: Never Used   Substance Use Topics   ??? Alcohol use: Yes     Alcohol/week: 3.0 standard drinks     Types: 3 Glasses of wine per week     Comment: 3 bottles of wine 2 days ago   ??? Drug use: No       Review of Systems    Constitutional: Positive for night sweats. Negative for fever.  Cardiovascular: Positive for chest pain.  Respiratory: Positive for cough and dyspnea.   Gastrointestinal: Negative for abdominal pain, vomiting, diarrhea.  Genitourinary: Negative for dysuria.    All other systems reviewed and are negative except as noted in HPI.      Physical Exam     ED Triage Vitals [06/28/20 1411]   Enc Vitals Group      BP 131/84      Heart Rate 81      SpO2 Pulse       Resp 20      Temp 36.9 ??C (98.4 ??F)      Temp Source Oral      SpO2 98 %      Weight (!) 106.6 kg (235 lb)      Height 1.829 m (6')      Head Circumference       Peak Flow       Pain Score       Pain Loc       Pain Edu?       Excl. in GC?        Constitutional: Alert and answering questions appropriately. Well appearing and in no acute distress.  Eyes: Conjunctivae are normal.   ENT       Head: Normocephalic and atraumatic.       Nose: No epistaxis.       Neck: No stridor.  Cardiovascular: Normal rate, regular rhythm. Normal S1, S2. No murmurs, rubs, or gallops.  Extremities warm, well-perfused.  Respiratory: Normal respiratory effort. Breath sounds are clear to auscultation bilaterally.  Gastrointestinal: Soft, non-distended, non-tender.  Genitourinary: Deferred.   Musculoskeletal: No obvious effusions, swelling, or long bone deformity.   Neurologic: Normal speech and language. Moves all extremities spontaneously. No gross focal neurologic deficits are appreciated.  Skin: Skin is warm, dry and intact. No rash noted.  Psychiatric: Mood and affect are normal. Speech and behavior are normal.     EKG     ECG of 06/28/2020 2:17 PM???normal sinus rhythm at a rate of 80, normal axis, normal intervals, no evidence of significant hypertrophy, no ST segment elevation or depression, no pathologic appearing T wave changes.     Radiology     ECG 12 lead (Adult)    Result Date: 06/28/2020  NORMAL SINUS RHYTHM NORMAL ECG WHEN COMPARED WITH  ECG OF 29-Sep-2019 14:17, NO SIGNIFICANT CHANGE WAS FOUND    XR Chest Portable    Result Date: 06/28/2020  EXAM: XR CHEST PORTABLE DATE: 06/28/2020 4:26 PM ACCESSION: [id number] UN DICTATED: 06/28/2020 4:27 PM INTERPRETATION LOCATION: [facility] CLINICAL INDICATION: 34 years old Male with COUGH  COMPARISON: Chest radiograph dated 09/29/2019 TECHNIQUE: Portable Chest Radiograph. FINDINGS: Unchanged left chest wall Port-A-Cath Status post right upper lobe lobectomy. Bronchial wall thickening with patchy bibasilar opacities, increased from prior accounting for degree of lung inflation No pneumothorax. Small right pleural effusion versus pleural scarring Stable cardiomediastinal silhouette. Postsurgical changes from resection of the posterior right sixth rib.     Bronchial wall thickening and bibasilar opacities may represent infection       Procedures     Documentation assistance was provided by Malena Catholic and Beola Cord, Scribes on June 14, 2020 at 2:18 PM for Lisa Roca, MD.    Documentation assistance was provided by the scribe in my presence.  The documentation recorded by the scribe has been reviewed by me and accurately reflects the services I personally performed.    Resa Miner. Ernst Bowler, MD, M. Renae Fickle  Chief Resident, [facility]  Phone: [phone]  Pager: [phone]        Lisa Roca, MD  Resident  06/29/20 [phone]

## 2020-06-28 NOTE — Unmapped (Addendum)
Hx of CF.  Reports having a flare up.  C/o cough, increased SOB, night sweats, chest pains (anterior and posterior), subjective fever.

## 2020-06-28 NOTE — Unmapped (Signed)
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 He apologized for not being able to come to clinic today for his appointment. His wife had school to attend and it was going to run later than expected.    Heinz did feel better for a while since his last sick call. However, cough has returned. Notes he is coughing constantly and is bringing up weird stuff. States it is green-gray sputum or maybe dark green.  Denies fevers, but reports night sweats. Trypp reports it is painful when breathing. He feels like it is time to return to the hospital for IV antibiotics. Will be brought to the hospital tomorrow.     Will follow-up with Dr [name] re: plan of care for hospitalization.     [name], RN  CF Nurse Coordinator   [phone]

## 2020-06-29 LAB — MAGNESIUM: MAGNESIUM: 1.4 mg/dL — ABNORMAL LOW (ref 1.6–2.6)

## 2020-06-29 LAB — BASIC METABOLIC PANEL
ANION GAP: 6 mmol/L (ref 5–14)
BLOOD UREA NITROGEN: 15 mg/dL (ref 9–23)
BUN / CREAT RATIO: 19
CALCIUM: 7.5 mg/dL — ABNORMAL LOW (ref 8.7–10.4)
CHLORIDE: 106 mmol/L (ref 98–107)
CO2: 25.6 mmol/L (ref 20.0–31.0)
CREATININE: 0.79 mg/dL
EGFR CKD-EPI AA MALE: >90 mL/min/{1.73_m2} (ref >=60)
EGFR CKD-EPI NON-AA MALE: >90 mL/min/{1.73_m2} (ref >=60)
GLUCOSE RANDOM: 152 mg/dL (ref 70–179)
POTASSIUM: 3.3 mmol/L — ABNORMAL LOW (ref 3.4–4.5)
SODIUM: 138 mmol/L (ref 135–145)

## 2020-06-29 LAB — CBC
HEMATOCRIT: 36.8 % — ABNORMAL LOW (ref 38.0–50.0)
HEMOGLOBIN: 12.3 g/dL — ABNORMAL LOW (ref 13.5–17.5)
MEAN CORPUSCULAR HEMOGLOBIN CONC: 33.5 g/dL (ref 30.0–36.0)
MEAN CORPUSCULAR HEMOGLOBIN: 27.3 pg (ref 26.0–34.0)
MEAN CORPUSCULAR VOLUME: 81.5 fL (ref 81.0–95.0)
MEAN PLATELET VOLUME: 8.1 fL (ref 7.0–10.0)
PLATELET COUNT: 239 10*9/L (ref 150–450)
RED BLOOD CELL COUNT: 4.51 10*12/L (ref 4.32–5.72)
RED CELL DISTRIBUTION WIDTH: 14.6 % (ref 12.0–15.0)
WBC ADJUSTED: 7.9 10*9/L (ref 3.5–10.5)

## 2020-06-29 MED ADMIN — pramipexole (MIRAPEX) tablet 0.25 mg: .25 mg | ORAL | @ 03:00:00 | Stop: 2020-07-04

## 2020-06-29 MED ADMIN — gabapentin (NEURONTIN) capsule 600 mg: 600 mg | ORAL | @ 15:00:00 | Stop: 2020-07-04

## 2020-06-29 MED ADMIN — insulin lispro (HumaLOG) injection 30 Units: 30 [IU] | SUBCUTANEOUS | @ 03:00:00 | Stop: 2020-07-04

## 2020-06-29 MED ADMIN — elexacaftor-tezacaftor-ivacaft (TRIKAFTA) tablet 2 tablet ***PATIENT SUPPLIED***: 2 | ORAL | @ 18:00:00 | Stop: 2020-07-04

## 2020-06-29 MED ADMIN — insulin lispro (HumaLOG) injection 0-12 Units: 0-12 [IU] | SUBCUTANEOUS | @ 18:00:00 | Stop: 2020-07-04

## 2020-06-29 MED ADMIN — insulin glargine (LANTUS) injection 50 Units: 50 [IU] | SUBCUTANEOUS | @ 04:00:00 | Stop: 2020-07-04

## 2020-06-29 MED ADMIN — elexacaftor-tezacaftor-ivacaft (TRIKAFTA) tablet 1 tablet ***PATIENT SUPPLIED***: 1 | ORAL | @ 03:00:00 | Stop: 2020-07-04

## 2020-06-29 MED ADMIN — pediatric multivitamin-vit D3 1,500 unit-vit K 800 mcg (MVW COMPLETE FORMULATION) capsule: 2 | ORAL | @ 03:00:00 | Stop: 2020-07-04

## 2020-06-29 MED ADMIN — fluticasone propionate (FLONASE) 50 mcg/actuation nasal spray 1 spray: 1 | NASAL | @ 19:00:00 | Stop: 2020-07-04

## 2020-06-29 MED ADMIN — lipase-protease-amylase (CREON) 36,000-114,000- 180,000 unit capsule ***PATIENT SUPPLIED***: 8 | ORAL | @ 18:00:00 | Stop: 2020-07-04

## 2020-06-29 MED ADMIN — fluticasone furoate-vilanteroL (BREO ELLIPTA) 200-25 mcg/dose inhaler 1 puff: 1 | RESPIRATORY_TRACT | @ 15:00:00 | Stop: 2020-07-04

## 2020-06-29 MED ADMIN — sodium chloride 3 % nebulizer solution 4 mL: 4 mL | RESPIRATORY_TRACT | @ 15:00:00 | Stop: 2020-07-04

## 2020-06-29 MED ADMIN — insulin lispro (HumaLOG) injection 0-12 Units: 0-12 [IU] | SUBCUTANEOUS | @ 23:00:00 | Stop: 2020-07-04

## 2020-06-29 MED ADMIN — cetirizine (ZyrTEC) tablet 10 mg: 10 mg | ORAL | @ 15:00:00 | Stop: 2020-07-04

## 2020-06-29 MED ADMIN — insulin lispro (HumaLOG) injection 30 Units: 30 [IU] | SUBCUTANEOUS | @ 23:00:00 | Stop: 2020-07-04

## 2020-06-29 MED ADMIN — insulin lispro (HumaLOG) injection 30 Units: 30 [IU] | SUBCUTANEOUS | @ 15:00:00 | Stop: 2020-07-04

## 2020-06-29 MED ADMIN — sodium chloride 3 % nebulizer solution 4 mL: 4 mL | RESPIRATORY_TRACT | @ 19:00:00 | Stop: 2020-07-04

## 2020-06-29 MED ADMIN — albuterol 2.5 mg /3 mL (0.083 %) nebulizer solution 2.5 mg: 2.5 mg | RESPIRATORY_TRACT | @ 19:00:00 | Stop: 2020-07-04

## 2020-06-29 MED ADMIN — FLUoxetine (PROzac) capsule 60 mg: 60 mg | ORAL | @ 15:00:00 | Stop: 2020-07-04

## 2020-06-29 MED ADMIN — piperacillin-tazobactam (ZOSYN) IVPB (premix) 4.5 g: 4.5 g | INTRAVENOUS | @ 18:00:00 | Stop: 2020-07-04

## 2020-06-29 MED ADMIN — acetaminophen (TYLENOL) tablet 1,000 mg: 1000 mg | ORAL | @ 11:00:00 | Stop: 2020-07-04

## 2020-06-29 MED ADMIN — acetaminophen (TYLENOL) tablet 1,000 mg: 1000 mg | ORAL | @ 18:00:00 | Stop: 2020-07-04

## 2020-06-29 MED ADMIN — albuterol 2.5 mg /3 mL (0.083 %) nebulizer solution 2.5 mg: 2.5 mg | RESPIRATORY_TRACT | @ 03:00:00 | Stop: 2020-07-04

## 2020-06-29 MED ADMIN — traZODone (DESYREL) tablet 100 mg: 100 mg | ORAL | @ 03:00:00 | Stop: 2020-07-04

## 2020-06-29 MED ADMIN — piperacillin-tazobactam (ZOSYN) IVPB (premix) 4.5 g: 4.5 g | INTRAVENOUS | @ 09:00:00 | Stop: 2020-07-04

## 2020-06-29 MED ADMIN — lipase-protease-amylase (CREON) 36,000-114,000- 180,000 unit capsule ***PATIENT SUPPLIED***: 8 | ORAL | @ 03:00:00 | Stop: 2020-07-04

## 2020-06-29 MED ADMIN — melatonin tablet 10.5 mg: 10 mg | ORAL | @ 03:00:00 | Stop: 2020-07-04

## 2020-06-29 MED ADMIN — tobramycin (NEBCIN) 260 mg in sodium chloride (NS) 0.9 % 100 mL IVPB: 260 mg | INTRAVENOUS | Stop: 2020-07-04

## 2020-06-29 MED ADMIN — tobramycin (NEBCIN) 260 mg in sodium chloride (NS) 0.9 % 100 mL IVPB: 260 mg | INTRAVENOUS | @ 23:00:00 | Stop: 2020-07-04

## 2020-06-29 MED ADMIN — insulin lispro (HumaLOG) injection 30 Units: 30 [IU] | SUBCUTANEOUS | @ 18:00:00 | Stop: 2020-07-04

## 2020-06-29 MED ADMIN — pediatric multivitamin-vit D3 1,500 unit-vit K 800 mcg (MVW COMPLETE FORMULATION) capsule: 2 | ORAL | @ 18:00:00 | Stop: 2020-07-04

## 2020-06-29 MED ADMIN — gabapentin (NEURONTIN) capsule 600 mg: 600 mg | ORAL | @ 18:00:00 | Stop: 2020-07-04

## 2020-06-29 MED ADMIN — montelukast (SINGULAIR) tablet 10 mg: 10 mg | ORAL | @ 03:00:00 | Stop: 2020-07-04

## 2020-06-29 MED ADMIN — lipase-protease-amylase (CREON) 36,000-114,000- 180,000 unit capsule ***PATIENT SUPPLIED***: 8 | ORAL | @ 15:00:00 | Stop: 2020-07-04

## 2020-06-29 MED ADMIN — sodium chloride 3 % nebulizer solution 4 mL: 4 mL | RESPIRATORY_TRACT | @ 03:00:00 | Stop: 2020-07-04

## 2020-06-29 MED ADMIN — albuterol 2.5 mg /3 mL (0.083 %) nebulizer solution 2.5 mg: 2.5 mg | RESPIRATORY_TRACT | @ 15:00:00 | Stop: 2020-07-04

## 2020-06-29 MED ADMIN — famotidine (PEPCID) tablet 20 mg: 20 mg | ORAL | @ 15:00:00 | Stop: 2020-07-04

## 2020-06-29 MED ADMIN — pantoprazole (PROTONIX) EC tablet 20 mg: 20 mg | ORAL | @ 15:00:00 | Stop: 2020-07-04

## 2020-06-29 MED ADMIN — insulin lispro (HumaLOG) injection 0-12 Units: 0-12 [IU] | SUBCUTANEOUS | @ 15:00:00 | Stop: 2020-07-04

## 2020-06-29 MED ADMIN — atorvastatin (LIPITOR) tablet 20 mg: 20 mg | ORAL | @ 03:00:00 | Stop: 2020-07-04

## 2020-06-29 MED ADMIN — oxyCODONE (ROXICODONE) immediate release tablet 5 mg: 5 mg | ORAL | @ 23:00:00 | Stop: 2020-07-01

## 2020-06-29 MED ADMIN — lamoTRIgine (LaMICtal) tablet 200 mg: 200 mg | ORAL | @ 18:00:00 | Stop: 2020-07-04

## 2020-06-29 MED ADMIN — phytonadione (vitamin K1) (MEPHYTON) tablet 5 mg: 5 mg | ORAL | @ 03:00:00 | Stop: 2020-06-30

## 2020-06-29 MED ADMIN — amitriptyline (ELAVIL) tablet 100 mg: 100 mg | ORAL | @ 03:00:00 | Stop: 2020-07-04

## 2020-06-29 MED ADMIN — piperacillin-tazobactam (ZOSYN) IVPB (premix) 4.5 g: 4.5 g | INTRAVENOUS | @ 03:00:00 | Stop: 2020-07-04

## 2020-06-29 MED ADMIN — tobramycin (NEBCIN) 260 mg in sodium chloride (NS) 0.9 % 100 mL IVPB: 260 mg | INTRAVENOUS | @ 11:00:00 | Stop: 2020-07-04

## 2020-06-29 MED ADMIN — rivaroxaban (XARELTO) tablet 20 mg: 20 mg | ORAL | @ 15:00:00 | Stop: 2020-07-04

## 2020-06-29 MED ADMIN — dornase alfa (PULMOZYME) 1 mg/mL solution 2.5 mg: 2.5 mg | RESPIRATORY_TRACT | @ 03:00:00 | Stop: 2020-06-29

## 2020-06-29 MED ADMIN — lisinopriL (PRINIVIL,ZESTRIL) tablet 10 mg: 10 mg | ORAL | @ 15:00:00 | Stop: 2020-07-04

## 2020-06-29 MED ADMIN — dornase alfa (PULMOZYME) 1 mg/mL solution 2.5 mg: 2.5 mg | RESPIRATORY_TRACT | @ 15:00:00 | Stop: 2020-06-29

## 2020-06-29 MED ADMIN — lipase-protease-amylase (CREON) 36,000-114,000- 180,000 unit capsule ***PATIENT SUPPLIED***: 8 | ORAL | @ 23:00:00 | Stop: 2020-07-04

## 2020-06-29 MED ADMIN — acetaminophen (TYLENOL) tablet 1,000 mg: 1000 mg | ORAL | @ 03:00:00 | Stop: 2020-07-04

## 2020-06-29 MED ADMIN — oxyCODONE (ROXICODONE) immediate release tablet 5 mg: 5 mg | ORAL | @ 03:00:00 | Stop: 2020-06-29

## 2020-06-29 MED ADMIN — magnesium sulfate 2gm/50mL IVPB: 2 g | INTRAVENOUS | @ 21:00:00 | Stop: 2020-06-29

## 2020-06-29 MED ADMIN — oxyCODONE (ROXICODONE) immediate release tablet 5 mg: 5 mg | ORAL | @ 15:00:00 | Stop: 2020-07-01

## 2020-06-29 MED ADMIN — insulin lispro (HumaLOG) injection 0-12 Units: 0-12 [IU] | SUBCUTANEOUS | @ 03:00:00 | Stop: 2020-07-04

## 2020-06-29 NOTE — Unmapped (Addendum)
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 male with PMHx as noted below who presents to [facility] with Cystic fibrosis with pulmonary exacerbation (CMS-HCC).    Cystic fibrosis with acute pulmonary exacerbation:   - COVID/Flu/RSV negative  - CXR shows bilateral basilar opacities, increased from prior, but likely due to pulmonary exacerbation; no lobar pneumonia or lung collapse.  - f/u CF sputum culture & AFB  - f/u CF PFTs ordered on admission & monitor weekly after that while inpatient  - Start IV antibiotics: Zosyn & Tobramycin (11/16- )  - Airway clearance: HTS/albuterol & chest vest QID  - Continue home Trikafta -- patient brought from home  - Home Symbicort to be replaced with Breo Ellipta per formulary   - Hold home pulmozyme for now just to make sure hemoptysis resolves/doesn't get worse -- consider restarting in the AM  - Hold home tobi nebs while on IV antibiotics  - Avoid NSAIDs  - Check IgE level though no history of ABPA  - Consider home IV antibiotics after 1 week if patient improving symptomatically and on PFTs   - Primary pumonologist: Dr. Maia Plan (Often seen by Durenda Hurt Rupcich PA)    Pancreatic insufficiency d/t CF:   - Continue pancreatic enzymes -- patient brought Creon from home bc we don't have high enough dose on formulary.  - PT-INR elevated on admission--> Start oral vitamin K 5 mg po twice weekly while on IV antibiotics    Diabetes mellitus d/t CF:   - continue home lantus 50 units at bedtime  - continue home lispro 30 units TID AC  - Lispro sliding scale ACHS    Advanced care planning.  - Code status: FULL CODE  - Healthcare proxy: Patient's spouse, Christian Young, [phone].    FEN/GI/PPX.  - Diet: high calorie high protein diet  - IVF: none  - Bowel regimen: senna & miralax prn  - GI ppx: not indicated  - Incentive spirometry  - DVT PPX: not indicated, patient fully mobile    Dispo: Floor status, inpatient, admit to [facility].     Floor time 45 minutes minutes, > 50% spent in counseling and coordination of care about the following issues:  management of acute pulmonary CF exacerbation, discussions with Spouse and Nurse.  ___________________________________________________________________    Chief Complaint  Chief Complaint   Patient presents with   ??? Shortness of Breath       HPI:  Christian Young is a 34 y.o. 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     Starting in mid-October he has had intermittent sinus pressure and drainage, ear pain, increased shortness of breath and cough for which he has been on and off antibiotics. When he took antibiotics he would start to feel better, then when he finished the course of  antibiotics he would go right back to feeling poorly. Having night sweats but no documented fever. Of note, he noticed streaks of bright red blood in his sputum this morning, which is unusual for him. He says it was a small amount. No unintentional weight loss. No recent travel. No diarrhea, nausea/vomiting, body aches, sore throat. He was vaccinated for COVID-19 with Pfizer x 2 and has plans to receive his booster on 11/29.     He monitors his own PFTs at home and his FEV1 seemed pretty good in October, which is why he thought it was just a sinus infection or ear infection. However, lately he has noticed that just walking gets him out of breath.    Allergies:  Cayston [aztreonam lysine], Cefepime, Other, Slo-bid 100, Banana, and Tobramycin     Medications:   Prior to Admission medications    Medication Dose, Route, Frequency   amitriptyline (ELAVIL) 100 MG tablet TAKE ONE TABLET BY MOUTH AT BEDTIME   atorvastatin (LIPITOR) 20 MG tablet 20 mg, Oral, Nightly   cetirizine (ZYRTEC) 10 MG tablet TAKE ONE TABLET BY MOUTH AT LUNCH   CREON 36,000-114,000- 180,000 unit CpDR TAKE 8 CAPSULES BY MOUTH 3 TIMES DAILY WITH MEALS AND TAKE 4 CAPSULES TWICE DAILY WITH SNACKS *MAY TAKE UP TO 1 EXTRA SNACK DAILY. MAX 36 PER DAY*   dornase alfa (PULMOZYME) 1 mg/mL nebulizer solution 2.5 mg, Inhalation, 2 times a day (standard)   elexacaftor-tezacaftor-ivacaft (TRIKAFTA) 100-50-75 mg(d) /150 mg (n) tablet Take 2 Tablets (orange) by mouth in the morning and 1 tablet (blue) in the evening with fatty food   famotidine (PEPCID) 20 MG tablet 20 mg, Oral, Daily (standard)   FLUoxetine 60 mg Tab 60 mg, Oral, Daily (standard)   fluticasone propionate (FLONASE) 50 mcg/actuation nasal spray USE 1 SPRAY IN EACH NOSTRIL EVERY DAY   gabapentin 600 mg Tb24 600 mg, Oral, Daily (standard)   insulin ASPART (NOVOLOG FLEXPEN) 100 unit/mL (3 mL) injection pen Use up to 150 units/day, divided 3 TIMES DAILY BEFORE MEALS  Patient taking differently: Inject 30 Units under the skin Three (3) times a day before meals. Use up to 150 units/day, divided 3 TIMES DAILY BEFORE MEALS   insulin glargine (BASAGLAR, LANTUS) 100 unit/mL (3 mL) injection pen 70 Units, Subcutaneous, Daily (standard)  Patient taking differently: Inject 50 Units under the skin nightly.    lamoTRIgine (LAMICTAL) 200 MG tablet 200 mg, Oral, Daily (standard)   lisinopriL (PRINIVIL,ZESTRIL) 10 MG tablet 10 mg, Oral, Daily (standard)   melatonin 10 mg Tab 10 mg, Oral, Nightly   montelukast (SINGULAIR) 10 mg tablet TAKE ONE TABLET BY MOUTH AT BEDTIME   MVW COMPLETE FORMUL PROBIOTIC 40 billion cell -15 mg CpDR 1 capsule, Oral, Daily (standard)  Patient taking differently: Take 1 capsule by mouth Two (2) times a day.    MVW Complete, pediatric multivit 61-D3-vit K, 1,500-800 unit-mcg cap 2 capsules, Oral, Daily (standard)  Patient taking differently: Take 1 capsule by mouth Two (2) times a day.    pramipexole (MIRAPEX) 0.125 MG tablet 0.25 mg, Oral, Every evening   sodium chloride 7% 7 % Nebu Inhale 4 mL by nebulization Two (2) times a day. Increase to 4 times while taking antibiotics   traZODone (DESYREL) 100 MG tablet 100 mg, Oral, Nightly   XARELTO 20 mg tablet 20 mg, Oral, Daily (standard)   albuterol (PROVENTIL HFA;VENTOLIN HFA) 90 mcg/actuation inhaler 2 puffs, Inhalation, Every 6 hours PRN   albuterol 2.5 mg/0.5 mL nebulizer solution  2.5 mg, Nebulization, 2 times a day (standard)   BETHKIS 300 mg/4 mL Nebu Inhale 1 vial via nebulization every 12 hours for 28 days, alternating every other 28 days   blood sugar diagnostic (GLUCOSE BLOOD) Strp Other, 3 times a day (with meals), Rx sent to [facility] 01/04/20    blood-glucose meter kit Dispense meter that is preferred by patient's insurance company   blood-glucose meter,continuous (DEXCOM G6 RECEIVER) Misc Use as directed   budesonide-formoteroL (SYMBICORT) 160-4.5 mcg/actuation inhaler 2 puffs, Inhalation, 2 times a day (standard)   DEXCOM G6 SENSOR Devi Use sq as directed every 14 days   DEXCOM G6 TRANSMITTER Devi Use as directed   EASY TOUCH LANCING DEVICE Misc Use as directed.   EASY TOUCH TWIST LANCETS 30 gauge Misc Use as directed.   mupirocin (BACTROBAN) 2 % ointment Apply inside of both nostrils and to other affected areas twice daily.   nebulizers Misc Use as directed with inhaled medications   pen needle, diabetic (BD ULTRA-FINE NANO PEN NEEDLE) 32 gauge x 5/32 (4 mm) Ndle use up to 4 times daily       Medical History:  Past Medical History:   Diagnosis Date   ??? Anxiety    ??? Chronic pain disorder    ??? Cystic fibrosis (CMS-HCC)    ??? Depression    ??? Hypertension    ??? Nonproductive cough 04/05/2018       Surgical History:  Past Surgical History:   Procedure Laterality Date   ??? IR INSERT PORT AGE GREATER THAN 5 YRS  03/27/2019    IR INSERT PORT AGE GREATER THAN 5 YRS 03/27/2019 Rush Barer, MD IMG [facility]   ??? PR REMOVAL OF LUNG,LOBECTOMY Right 03/29/2017    Procedure: REMOVAL OF LUNG, OTHER THAN PNEUMONECTOMY; SINGLE LOBE (LOBECTOMY);  Surgeon: Cherie Dark, MD;  Location: MAIN OR [facility];  Service: Thoracic       Social History:  Social History     Socioeconomic History   ??? Marital status: Married     Spouse name: Not on file   ??? Number of children: Not on file   ??? Years of education: Not on file   ??? Highest education level: Not on file   Occupational History   ??? Not on file   Tobacco Use   ??? Smoking status: Former Smoker     Types: e-Cigarettes   ??? Smokeless tobacco: Never Used   Substance and Sexual Activity   ??? Alcohol use: Yes     Alcohol/week: 3.0 standard drinks     Types: 3 Glasses of wine per week     Comment: 3 bottles of wine 2 days ago   ??? Drug use: No   ??? Sexual activity: Not Currently   Other Topics Concern   ??? Not on file   Social History Narrative   ??? Not on file     Social Determinants of Health     Financial Resource Strain: Not on file   Food Insecurity: Not on file   Transportation Needs: Not on file   Physical Activity: Not on file   Stress: Not on file   Social Connections: Not on file       Family History:  Family History   Problem Relation Age of Onset   ??? Bipolar disorder Mother    ??? Depression Mother        Review of Systems:  10 systems reviewed and are negative unless otherwise mentioned in HPI  Physical Exam:  Temp:  [35.7 ??C (96.3 ??F)-36.9 ??C (98.4 ??F)] 35.7 ??C (96.3 ??F)  Heart Rate:  [72-81] 72  Resp:  [18-20] 18  BP: (108-131)/(57-84) 108/57  SpO2:  [98 %] 98 %  Body mass index is 31.72 kg/m??.    General: Well appearing, alert, conversant, cooperative, and in no distress.  Eyes: No scleral icterus. PERRL.  ENT: Normal appearing nose. Oropharyngeal mucus membranes moist & pink.  Cardiovascular: Normal rate, regular rhythm. No murmur.  Extremities: Warm to touch. Regular 2+ radial pulses bilaterally. No LE edema.  Respiratory: Normal respiratory effort on room air. Clear to auscultation bilaterally.  Gastrointestinal: Soft, non-tender, non-distended with normoactive bowel sounds.  Neurologic: Alert and fully oriented. Normal speech and language. Normal gait.  Skin: Skin is warm, dry and intact. No rash.  Psychiatric: Mood and affect are normal. Speech and behavior are normal.      Test Results:  Data Review:    All lab results last 24 hours:    Recent Results (from the past 24 hour(s))   ECG 12 lead (Adult)    Collection Time: 06/28/20  2:17 PM   Result Value Ref Range    EKG Systolic BP  mmHg    EKG Diastolic BP  mmHg    EKG Ventricular Rate 80 BPM    EKG Atrial Rate 80 BPM    EKG P-R Interval 156 ms    EKG QRS Duration 90 ms    EKG Q-T Interval 368 ms    EKG QTC Calculation 424 ms    EKG Calculated P Axis 62 degrees    EKG Calculated R Axis 68 degrees    EKG Calculated T Axis 44 degrees    QTC Fredericia 405 ms   Protime-INR    Collection Time: 06/28/20  2:43 PM   Result Value Ref Range    PT 20.7 (H) 10.3 - 13.4 sec    INR 1.78    Comprehensive Metabolic Panel    Collection Time: 06/28/20  2:57 PM   Result Value Ref Range    Sodium 135 135 - 145 mmol/L    Potassium 4.0 3.4 - 4.5 mmol/L    Chloride 99 98 - 107 mmol/L    Anion Gap 7 5 - 14 mmol/L    CO2 28.8 20.0 - 31.0 mmol/L    BUN 14 9 - 23 mg/dL    Creatinine 9.14 7.82 - 1.10 mg/dL    BUN/Creatinine Ratio 20     EGFR CKD-EPI Non-African American, Male >90 >=60 mL/min/1.23m2    EGFR CKD-EPI African American, Male >90 >=60 mL/min/1.28m2    Glucose 234 (H) 70 - 179 mg/dL    Calcium 9.5 8.7 - 95.6 mg/dL    Albumin 3.9 3.4 - 5.0 g/dL    Total Protein 7.5 5.7 - 8.2 g/dL    Total Bilirubin 0.6 0.3 - 1.2 mg/dL    AST 23 <=21 U/L    ALT 33 10 - 49 U/L    Alkaline Phosphatase 90 46 - 116 U/L   C-reactive protein    Collection Time: 06/28/20  2:57 PM   Result Value Ref Range    CRP <4.0 <=10.0 mg/L   CBC w/ Differential    Collection Time: 06/28/20  2:57 PM   Result Value Ref Range    WBC 7.9 3.5 - 10.5 10*9/L    RBC 4.88 4.32 - 5.72 10*12/L    HGB 13.3 (L) 13.5 - 17.5 g/dL    HCT 30.8 65.7 -  50.0 %    MCV 81.5 81.0 - 95.0 fL    MCH 27.2 26.0 - 34.0 pg    MCHC 33.4 30.0 - 36.0 g/dL    RDW 16.1 09.6 - 04.5 %    MPV 8.4 7.0 - 10.0 fL    Platelet 279 150 - 450 10*9/L    nRBC 0 <=4 /100 WBCs    Neutrophils % 69.1 %    Lymphocytes % 21.0 %    Monocytes % 6.8 %    Eosinophils % 1.7 %    Basophils % 1.4 %    Absolute Neutrophils 5.4 1.7 - 7.7 10*9/L    Absolute Lymphocytes 1.7 0.7 - 4.0 10*9/L    Absolute Monocytes 0.5 0.1 - 1.0 10*9/L    Absolute Eosinophils 0.1 0.0 - 0.7 10*9/L    Absolute Basophils 0.1 0.0 - 0.1 10*9/L   Respiratory Pathogen Panel with COVID-19    Collection Time: 06/28/20  3:18 PM   Result Value Ref Range    Adenovirus Not Detected Not Detected    Coronavirus HKU1 Not Detected Not Detected    Coronavirus NL63 Not Detected Not Detected    Coronavirus 229E Not Detected Not Detected    Coronavirus OC43 PCR Not Detected Not Detected    Metapneumovirus Not Detected Not Detected    Rhinovirus/Enterovirus Not Detected Not Detected    Influenza A Not Detected Not Detected    Influenza B Not Detected Not Detected    Parainfluenza 1 Not Detected Not Detected    Parainfluenza 2 Not Detected Not Detected    Parainfluenza 3 Not Detected Not Detected    Parainfluenza 4 Not Detected Not Detected    RSV Not Detected Not Detected    Bordetella pertussis Not Detected Not Detected    Bordetella parapertussis Not Detected Not Detected    Chlamydophila (Chlamydia) pneumoniae Not Detected Not Detected    Mycoplasma pneumoniae Not Detected Not Detected    SARS-CoV-2 PCR Not Detected Not Detected       Imaging: Radiology studies were personally reviewed and ECG 12 lead (Adult)    Result Date: 06/28/2020  NORMAL SINUS RHYTHM NORMAL ECG WHEN COMPARED WITH ECG OF 29-Sep-2019 14:17, NO SIGNIFICANT CHANGE WAS FOUND    XR Chest Portable    Result Date: 06/28/2020  EXAM: XR CHEST PORTABLE DATE: 06/28/2020 4:26 PM ACCESSION: [id number] UN DICTATED: 06/28/2020 4:27 PM INTERPRETATION LOCATION: [facility] CLINICAL INDICATION: 34 years old Male with COUGH  COMPARISON: Chest radiograph dated 09/29/2019 TECHNIQUE: Portable Chest Radiograph. FINDINGS: Unchanged left chest wall Port-A-Cath Status post right upper lobe lobectomy. Bronchial wall thickening with patchy bibasilar opacities, increased from prior accounting for degree of lung inflation No pneumothorax. Small right pleural effusion versus pleural scarring Stable cardiomediastinal silhouette. Postsurgical changes from resection of the posterior right sixth rib.     Bronchial wall thickening and bibasilar opacities may represent infection      EKG: EKG was personally reviewed and noted to be and normal EKG, normal sinus rhythm

## 2020-06-29 NOTE — Unmapped (Signed)
Care Management  Initial Transition Planning Assessment   Type of Residence: Mailing Address:  [address]  [address]  [place] [address]  Contacts: Accompanied by: Significant other Extended Emergency Contact Information  Primary Emergency Contact: Carmin [place]  Mobile Phone: [phone]  Relation: Spouse  Preferred language: ENGLISH  Interpreter needed? No    Patient Phone Number: [phone] (home)             Medical Provider(s): [facility]  Reason for Admission: Admitting Diagnosis:  CF (cystic fibrosis) (CMS-HCC) [E84.9]  Productive cough [R05.8]  Dyspnea, unspecified type [R06.00]  Past Medical History:   has a past medical history of Anxiety, Chronic pain disorder, Cystic fibrosis (CMS-HCC), Depression, Hypertension, and Nonproductive cough (04/05/2018).  Past Surgical History:   has a past surgical history that includes pr removal of lung,lobectomy (Right, 03/29/2017) and IR Insert Port Age Greater Than 5 Years (03/27/2019).   Previous admit date: 09/29/2019    Primary Insurance- Payor: Medical sales representative / Plan: [employer] CT GEN CHOICE FUND OA PLUS / Product Type: *No Product type* /   Secondary Insurance ??? None  Prescription Coverage ??? [employer]  Preferred Pharmacy - [facility] [place] - [address]  [facility]  [facility] [place], [place] - [address]    Transportation home: Private vehicle, wife will drive him home.                   General  Care Manager assessed the patient by : Telephone conversation with family,Medical record review,Discussion with Clinical Care team (Spoke with patient's wife as he was sleeping.)  Orientation Level: Oriented X4  Functional level prior to admission: Independent  Reason for referral: Discharge Planning,Home Health    Contact/Decision Maker  Extended Emergency Contact Information  Primary Emergency Contact: Carmin [place] [employer] Phone: [phone]  Relation: Spouse  Preferred language: ENGLISH  Interpreter needed? No    Legal Next of Kin / Guardian / POA / Advance Directives       Advance Directive (Medical Treatment)  Does patient have an advance directive covering medical treatment?: Patient does not have advance directive covering medical treatment.  Reason patient does not have an advance directive covering medical treatment:: Patient does not wish to complete one at this time.    Health Care Decision Maker [HCDM] (Medical & Mental Health Treatment)  Healthcare Decision Maker: Patient does not wish to appoint a Health Care Decision Maker at this time  Information offered on HCDM, Medical & Mental Health advance directives:: Patient declined information.         Patient Information  Lives with: Spouse/significant other    Type of Residence: Private residence        Location/Detail: [address] [place], [place]    Support Systems/Concerns: Children,Spouse    Responsibilities/Dependents at home?: No    Home Care services in place prior to admission?: No          Outpatient/Community Resources in place prior to admission: Clinic  Agency detail (Name/Phone #): (PCP) [facility]    Equipment Currently Used at Home: none       Currently receiving outpatient dialysis?: No       Financial Information       Need for financial assistance?: No       Social Determinants of Health  Social Determinants of Health were addressed  in provider documentation.  Please refer to patient history.  Social Determinants of Health     Tobacco Use: Medium Risk   ??? Smoking Tobacco Use: Former Smoker   ??? Smokeless Tobacco Use: Never Used   Alcohol Use: Not on file   Financial Resource Strain: Low Risk    ??? Difficulty of Paying Living Expenses: Not hard at all   Food Insecurity: No Food Insecurity   ??? Worried About Programme researcher, broadcasting/film/video in the Last Year: Never true   ??? Ran Out of Food in the Last Year: Never true   Transportation Needs: No Transportation Needs ??? Lack of Transportation (Medical): No   ??? Lack of Transportation (Non-Medical): No   Physical Activity: Not on file   Stress: Not on file   Social Connections: Not on file   Intimate Partner Violence: Not on file   Depression: Not on file   Housing/Utilities: Low Risk    ??? Within the past 12 months, have you ever stayed: outside, in a car, in a tent, in an overnight shelter, or temporarily in someone else's home (i.e. couch-surfing)?: No   ??? Are you worried about losing your housing?: No   ??? Within the past 12 months, have you been unable to get utilities (heat, electricity) when it was really needed?: No   Substance Use: Not on file   Health Literacy: Low Risk    ??? : Never       Discharge Needs Assessment  Concerns to be Addressed: denies needs/concerns at this time,discharge planning    Clinical Risk Factors: Multiple Diagnoses (Chronic)    Barriers to taking medications: No    Prior overnight hospital stay or ED visit in last 90 days: No    Readmission Within the Last 30 Days: no previous admission in last 30 days         Anticipated Changes Related to Illness: none    Equipment Needed After Discharge: none    Discharge Facility/Level of Care Needs: other (see comments) (Possible Home Infusion)    Readmission  Risk of Unplanned Readmission Score: UNPLANNED READMISSION SCORE: 22%  Predictive Model Details          22% (Medium)  Factor Value    Calculated 06/29/2020 08:05 45% Number of active Rx orders 73    [place] Risk of Unplanned Readmission Model 8% ECG/EKG order present in last 6 months     8% Latest calcium low (7.5 mg/dL)     7% Encounter of ten days or longer in last year present     6% Imaging order present in last 6 months     5% Latest hemoglobin low (12.3 g/dL)     5% Number of ED visits in last six months 1     4% Number of hospitalizations in last year 1     4% Active anticoagulant Rx order present     2% Age 6     2% Charlson Comorbidity Index 2     2% Future appointment scheduled     1% Active ulcer medication Rx order present     1% Current length of stay 0.556 days      Readmitted Within the Last 30 Days? (No if blank)   Patient at risk for readmission?: Yes    Discharge Plan  Screen findings are: Discharge planning needs identified or anticipated (Comment).    Expected Discharge Date: 07/04/2020    Expected Transfer from Critical Care:  (N/A)    Quality  data for continuing care services shared with patient and/or representative?: Yes  Patient and/or family were provided with choice of facilities / services that are available and appropriate to meet post hospital care needs?: Yes   List choices in order highest to lowest preferred, if applicable. : [facility]    Initial Assessment complete?: Yes    Clenton Pare  June 29, 2020 11:53 AM

## 2020-06-29 NOTE — Unmapped (Signed)
Cystic Fibrosis Nutrition Assessment    Inpatient, Telehealth via phone: MD Consult this admission and related follow up  Primary Pulmonary Provider: Verlin Fester, PA  ===================================================================  Christian Young is a 34 y.o. male w/ PMH significant for CF s/p RUL lobectomy, PI, and CFRD who presented from the ED on 11/16 with CF exacerbation. Plan for IV abx.    Spoke with wife via telephone. She denies any nutrition needs or concerns at this time, reported Christian Young was resting. Per chart review, patient brought home supply of vitamins as his prescription is unavailable on inpatient formulary.   ===================================================================  INTERVENTION:    1. Continue High Calorie High Protein Diet    2. Recommend 5mg  phytonadione daily x 5 days given elevated PT on admission. If WNL after 5 days, decrease frequency to 2x weekly while on IV abx.    3. Recommend CF vitamin regimen as ordered:  [facility] Complete Formulation gel cap 2 daily     4. Weigh patient twice weekly this admit    5. Replete lytes PRN; K+ currently low    6. Please obtain Vitamin D level prior to d/c    7. Continue remainder of nutrition regimen:  - enzyme regimen; Creon 36,000 x 8 caps/meal, 4 caps/snack  - acid reducer; protonix, pepcid  - bowel regimen per team  - insulin regimen per endo    Inpatient:   Will follow up with patient per protocol: 1-2 times per week (and more frequent as indicated)  No discharge needs identified at this time.   ===================================================================  ASSESSMENT:  Nutrition Category = Adult Class 1 Obesity: BMI 30 to < 35 kg/m2    Estimated daily needs: 3717 kcal/day, 126-169 g PRO/day, 3216 mL fluid/day      Calories estimated using: Cystic Fibrosis Conference Formula, protein per DRI x 1.5-2, fluid per [facility]    Current diet is appropriate for CF. Current PO intake is adequate to meet estimated CF needs. Patient continues to work towards goals for weight management.   Enzyme dose is within established guidelines. Vitamin prescription is appropriate to reach/maintain optimal fat soluble vitamin levels. Supplementation with extra vitamin K will improve deficiency indicated by elevated PT. Bowel regimen is appropriate. Acid reducer appropriate for GERD and enzyme activation. Patient on CFTR modulator is consuming adequate amounts of fat-containing foods with prescribed medication to optimize absorption.    ASPEN/AND Malnutrition Screening:  Patient does not meet ASPEN/AND criteria for malnutrition at this time.    Goals:  1. Ongoing:  Meet estimated daily needs  2. Ongoing:  Reach/maintain established anthropometric goals for Adult CF: BMI < 30 kg/m2   3. Ongoing:  Normal fat-soluble vitamin levels: Vitamin A, Vitamin E and PT per lab range; Vitamin D 25OH total >30   4. Ongoing:  Maintain glucose control. Carbohydrate content of diet should comprise 40-50% of total calorie needs, but carbohydrates are not restricted in this population.    5. Ongoing:  Meet sodium needs for CF     Nutrition goals reviewed, and relevant barriers identified and addressed: none evident.   Patient is evaluated to have good  willingness and ability to achieve nutrition goals.   ===================================================================  INPATIENT:  Christian Young is admitted with CF (cystic fibrosis) (CMS-HCC) [E84.9]  Productive cough [R05.8]  Dyspnea, unspecified type [R06.00].    Current Nutrition Orders (inpatient):  Oral intake        Nutrition Orders   (From admission, onward)  Start     Ordered    06/28/20 2049  Nutrition Therapy High Calorie High Protein  Effective now        Question:  Nutrition Therapy:  Answer:  High Calorie High Protein    06/28/20 2048              CF Nutrition related medications (inpatient): Nutritionally relevant medications reviewed.   Trikafta  Creon 36,000 x 8 caps/meal, 4 caps/snack (providing 2722 lipase units/kg)  Pepcid  Protonix  PRN bowel regimen  Insulin regimen  Vit K 5mg  2x weekly    CF Nutrition related labs (inpatient):    Nutritionally pertinent labs reviewed.  PT elevated at 20.7. K+ low at 3.3 mmol/L    ==================================================================  CLINICAL DATA:  Past Medical History:   Diagnosis Date   ??? Anxiety    ??? Chronic pain disorder    ??? Cystic fibrosis (CMS-HCC)    ??? Depression    ??? Hypertension    ??? Nonproductive cough 04/05/2018       Anthroprometric Evaluation:  Weight changes: per EPIC wt hx, pt has experienced 13.7% wt loss over the past ~1.5 years. Intentional per chart review.  CFTR modulator and weight change: On Trikafta  BMI Readings from Last 3 Encounters:   06/28/20 31.64 kg/m??   01/04/20 33.09 kg/m??   09/29/19 33.79 kg/m??     Wt Readings from Last 10 Encounters:   06/28/20 (!) 105.8 kg (233 lb 4.8 oz)   01/04/20 (!) 110.7 kg (244 lb)   09/29/19 (!) 113 kg (249 lb 1.9 oz)   03/24/19 (!) 122 kg (269 lb 1.1 oz)   03/16/19 (!) 119.3 kg (263 lb)   01/20/19 (!) 117.9 kg (260 lb)   12/01/18 (!) 113.4 kg (250 lb)   11/21/18 (!) 118.2 kg (260 lb 9.6 oz)   10/01/18 (!) 122.5 kg (270 lb)   08/14/18 (!) 120.7 kg (266 lb 3.2 oz)     Ht Readings from Last 3 Encounters:   06/28/20 182.9 cm (6')   01/04/20 182.9 cm (6')   09/29/19 182.9 cm (6')     ==================================================================  Energy Intake (outpatient):  Diet: Unable to obtain detailed nutr hx; pt resting at time of call.    Allergies, Intolerances, Sensitivities, and/or Cultural/Religious Dietary Restrictions:    Allergies   Allergen Reactions   ??? [place] [Aztreonam Lysine] Anaphylaxis   ??? Cefepime Itching and Nausea Only   ??? Other Anaphylaxis and Other (See Comments)     Other reaction(s): Other (See Comments)  Bananas: itchy throat  Slo Bid record from [employer] states anaphylaxis.????Pt states this was from childhood and does not know reaction.  Bananas, causes itchy throat   ??? Slo-Bid 100 Anaphylaxis   ??? Banana Itching and Nausea And Vomiting   ??? Tobramycin Tinnitus     From OSH record-documented as tinnitus but has received IV tobra with close monitoring.    see above  Sodium in diet: Adequate from diet  Calcium in diet:  historically adequate  CFTR modulator and Diet: Prescribed Trikafta (elexacaftor/tezacaftor/ivacaftor).  PO Supplements: none  Appetite Stimulant: none  Enteral feeding tube: none  Physical activity: not discussed today    Fat Malabsorption (outpatient):  Enzyme brand, (meals/snacks):  Creon 36,000 @ 8/meal and 4/snack  Enzyme administration details: correct pre-meal administration., good compliance at all meals and snacks  Enzyme dose per MEAL (units lipase/kg/meal) 2722  Enzyme dose per DAY (units lipase/kg/day) 13610  GI meds:  Nutritionally relevant medications  reviewed.  Stools: no documented BMs since admission  Fecal Fat Studies: see below  No results found for: [id number]  Lab Results   Component Value Date    ELAST <15 (L) 03/19/2017     No results found for: PELAI    Vitamins/Minerals (outpatient):  CF-specific MVI, dose, compliance: [facility] Complete Formulation Softgel regular 2 daily, good compliance  Other vitamins/minerals/herbals:   - MVW probiotic  Patient Resources for vitamins: purchases directly from MVW website  Calcium supplement: none; PRN tums this admission  Fat-soluble vitamin levels: incorrect Vit D lab drawn June 2021; suggest repeat while admitted  Lab Results   Component Value Date    VITAMINA 42.4 01/18/2020    VITAMINA 44.4 11/19/2016     Lab Results   Component Value Date    CRP <4.0 06/28/2020    CRP 3 01/18/2020    CRP 24.9 (H) 08/15/2018    CRP 9.8 08/19/2017    CRP 7.7 11/26/2016     Lab Results   Component Value Date    VITDTOTAL 27.9 03/19/2017     No results found for: VITAME  Lab Results   Component Value Date    PT 20.7 (H) 06/28/2020    PT 18.7 (H) 01/29/2019    PT 17.6 (H) 11/18/2018    PT 14.2 (H) 08/13/2018    PT 12.9 (H) 08/19/2017     Lab Results   Component Value Date    DESGCARBPT 0.2 03/20/2017     No results found for: PIVKAII    Bone Health: Abnormal vitamin D level, not yet treated.   Last DEXA normal.     CF Related Diabetes: yes; on insulin. Followed by endocrinology.    Lab Results   Component Value Date    GLUF 178 (H) 01/18/2020     No results found for: GLUCOSE2HR  Lab Results   Component Value Date    A1C 9.4 (H) 01/18/2020    A1C 8.8 (H) 09/29/2019    A1C 8.5 (H) 08/12/2018     Jackqulyn Livings MPH, RD, LDN  Pager: [phone]

## 2020-06-29 NOTE — Unmapped (Signed)
Hospital Medicine Daily Progress Note    Assessment/Plan:    Principal Problem:    Cystic fibrosis with pulmonary exacerbation (CMS-HCC)  Active Problems:    Essential hypertension    Depressive disorder    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)    Pancreatic insufficiency due to cystic fibrosis (CMS-HCC)    History of Mycobacterium abscessus infection    Chronic deep vein thrombosis (DVT) of lower extremity (CMS-HCC)    Obesity (BMI 30-39.9)    Restless leg syndrome  Resolved Problems:    * No resolved hospital problems. *   Malnutrition Evaluation as performed by RD, LDN: Patient does not meet AND/ASPEN criteria for malnutrition at this time (06/29/20 1131)             Christian Young is a 34 y.o. male that presented to [facility] with Cystic fibrosis with pulmonary exacerbation (CMS-HCC).    Acute Exacerbation of Bronchiectasis, CF: COVID/flu/RSV negative. CXR with increased bibasilar opacities compared to prior imaging. Still with ongoing dyspnea on exertion, congestion, and cough.   - Antibiotics: Zosyn and IV tobramycyin (11/16 - )  - Airway clearance: HTS+albuterol, daily Pulmozyme, chest vest  - Continue home Trikafta  - Continue home cetirizine, montelukast  - Home Symbicort substituted with formulary Breo Ellipta  - Holding tobi nebs while on IV antibiotics  - F/U IgE level  - F/U repeat CT sputum culture, AFB  - PFTs ordered, will monitor weekly while admitted    CF Related Pancreatic insufficiency:  - Continue pancreatic enzymes (home formulary)  - Continue PO vitamin K  ??  CF Related Diabetes:    - Continue home lantus 50 units at bedtime  - Continue home lispro 30 units TID AC  - Lispro sliding scale ACHS    Anxiety/Depression: Continue fluoxetine, lamotrigine, amitriptyline    Hypertension: Continue lisinopril    History of DVT: Continue home Xarelto    RLS: Continue home pramipexole, gabapentin    Hypokalemia: KCL ordered, repeat in AM    Hypomagnesemia: 2g IV ordered, repeat in AM    FEN/GI:  - High protein/high calorie diet  - No IVF  - Replete lytes PRN    PPx: Home Xarelto    Advanced care planning.  - Code status: FULL CODE  - Healthcare proxy: Patient's spouse, Nahome Bublitz, [phone].  ___________________________________________________________________    Subjective:  Doing about the same. Still having pain across his chest with coughing, but oxycodone PRN has been helpful. Denies any hemoptysis. Still feeling short of breath with ambulation. Appetite is good. No nausea/vomiting. Denies constipation.    Labs/Studies:  Labs and Studies from the last 24hrs per EMR and Reviewed    Objective:  Temp:  [35.7 ??C (96.3 ??F)-36.8 ??C (98.3 ??F)] 36.8 ??C (98.3 ??F)  Heart Rate:  [72-98] 98  Resp:  [16-18] 18  BP: (106-120)/(56-62) 111/59  SpO2:  [95 %-98 %] 95 %    Gen: Sitting up in chair. Awake and alert. NAD. Non-toxic.  HEENT: EOMI, sclera anicteric. MMM.  Chest: L chest port accessed, clean overlying dressing. Site c/d/i.  Resp: Normal WOB. Equal air movement bilaterally. CTAB  CV: RRR, normal s1s2, no m/g/r.   Abd: Soft, NTND, normoactive BS.  Ext: WWP. No c/c/e.

## 2020-06-29 NOTE — Unmapped (Signed)
PULMONARY CONSULT  NOTE      Patient: Christian Young(1985/10/22)  Reason for consultation: Christian Young is a 34 y.o. male who is seen in consultation at the request of Kathryne Hitch, MD for comprehensive evaluation of CF with acute exacerbation.    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 *    ?? Cystic fibrosis with acute pulmonary exacerbation  ?? Cystic fibrosis with GI manifestations- pancreatic insufficiency  ?? DM secondary to CF  ?? Elevated INR    Recommendations:  - f/u repeat CF sputum culture and AFB culture if patient can produce sputum  -  IV Tobramycin and IV Zosyn (started 11/16) per prior admissions - patient has a Port for access  - Aggressive airway clearance with Albuterol, Hypertonic Saline + Aerobika/Vest QID  - Continue Trikafta and Breo (formulary alternative to symbicort); would continue pulmozyme  - agree with holding home tobi nebs  - baseline spirometry, then weekly while in-house   - Consult Nutrition   - Would consider home IV's after 1 week if patient improving symptomatically and on PFT's   - vitamins ADEK- would give vit K daily x  5 days as INR elevated  - follow Magnesium levels while on IV tobramycin  - agree with high protein/high calorie diet  - ok for home Creon  - bowel regimen  ??    We appreciate the opportunity to assist in the care of this patient.  Please page [phone] with any questions.    Carmelia Roller, MD    Subjective:      History of Present Illness:  Christian Young is a 34 y.o. male with hx of CF (genetics [phone] and [id number] insertion), s/p right upper lobectomy MABSC infection for  admitted for worsening cough and dyspnea over the last month. He reported associated sinus pressure and drainage. He reported streaky hemoptysis on the morning of admission but none since. He is vaccinated with Pfizer vaccine for covid. He says his dyspnea has been persistent over the last month. He has not been hospitalized in a year. 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 His cough is persistent but largely non-productive. He is a former smoker. He notes significant QOL improvements after starting Trikafta. He denies any recent loose stools.    Review of Systems: A comprehensive review of systems was performed and was negative except as above in HPI  Past Medical History:   Diagnosis Date   ??? Anxiety    ??? Chronic pain disorder    ??? Cystic fibrosis (CMS-HCC)    ??? Depression    ??? Hypertension    ??? Nonproductive cough 04/05/2018     Past Surgical History:   Procedure Laterality Date   ??? IR INSERT PORT AGE GREATER THAN 5 YRS  03/27/2019    IR INSERT PORT AGE GREATER THAN 5 YRS 03/27/2019 Rush Barer, MD IMG VIR [facility]   ??? PR REMOVAL OF LUNG,LOBECTOMY Right 03/29/2017  Procedure: REMOVAL OF LUNG, OTHER THAN PNEUMONECTOMY; SINGLE LOBE (LOBECTOMY);  Surgeon: Cherie Dark, MD;  Location: [facility];  Service: Thoracic     Medications reviewed in Epic  Allergies as of 06/28/2020 - Reviewed 06/28/2020   Allergen Reaction Noted   ??? [place] [aztreonam lysine] Anaphylaxis 12/27/2016   ??? Cefepime Itching and Nausea Only 09/06/2015   ??? Other Anaphylaxis and Other (See Comments) 09/06/2015   ??? Slo-bid 100 Anaphylaxis 06/20/2017   ??? Banana Itching and Nausea And Vomiting 07/29/2016   ??? Tobramycin Tinnitus 09/06/2015     Family History   Problem Relation Age of Onset   ??? Bipolar disorder Mother    ??? Depression Mother      Social History     Tobacco Use   ??? Smoking status: Former Smoker     Types: e-Cigarettes   ??? Smokeless tobacco: Never Used   Substance Use Topics   ??? Alcohol use: Yes     Alcohol/week: 3.0 standard drinks     Types: 3 Glasses of wine per week     Comment: 3 bottles of wine 2 days ago        Objective:      Physical Exam:  Vitals:    06/28/20 2101 06/28/20 2113 06/29/20 0800 06/29/20 1011   BP: 120/56 115/62 106/57 113/59   Pulse: 78 88 81    Resp: 16 16 16     Temp: 36.1 ??C (97 ??F) 36.6 ??C (97.9 ??F) 36.3 ??C (97.3 ??F)    TempSrc: Oral Temporal Temporal    SpO2: 97% 95% 96%    Weight:  (!) 105.8 kg (233 lb 4.8 oz)     Height:         General: Alert, well-appearing, and in no distress.  Eyes: Anicteric sclera, conjunctiva clear.  ENT:  Mucous membranes moist and intact.  Lymph: No cervical or supraclavicular adenopathy.  Lungs: Diminished breath sounds. Reduced excursion. few rhonci  Cardiovascular: Regular rate and rhythm, S1, S2 normal, no murmur, click, rub or gallop appreciated.  Abdomen: Soft, non-tender, not distended  Musculoskeletal: No clubbing and no synovitis.  Skin: No rashes or lesions.  Neuro: CN grossly intact, motor and sensation intact    Malnutrition Assessment by RD:          Diagnostic Review:   All labs and images were personally reviewed.    CXR 11/16: volume loss on R s/p lobectomy, patchy bibasilar airspace opacities with indistinct R hemidiaphragm border

## 2020-06-29 NOTE — Unmapped (Signed)
Aminoglycoside Therapeutic Monitoring Pharmacy Note    Christian Young is a 34 y.o. male starting tobramycin. Date of therapy initiation: 06/28/2020    Indication: CF exacerbation    Prior Dosing Information: Previous regimen 280mg  q12h  (treatment during admission on 10/02/19)    Goals:  Therapeutic Drug Levels  Trough level: tobramycin <1 mg/L  Peak level: tobramycin 10-14 mg/L    Additional Clinical Monitoring/Outcomes  Renal function, volume status (intake and output)    Results:   Not applicable   Not applicable    Wt Readings from Last 1 Encounters:   06/28/20 (!) 106.1 kg (233 lb 14.5 oz)     Lab Results   Component Value Date    CREATININE 0.69 06/28/2020       Pharmacokinetic Considerations and Significant Drug Interactions:  Adult (estimated initial): Vd = 29.37 L, ke = 0.298 hr-1  Concurrent nephrotoxic meds:  Zosyn    Assessment/Plan:  Recommendation(s)  Start 260mg  q12h . Based on previous tobramycin dose in Feb 2021, patient had therapeutic levels with 280 mg (~3 mg/kg, Adjusted BW) q12h. Patient has lost some weight since last admission, was 113 kg.   Estimated peak and trough on recommended regimen: peak = 8.22 mg/L, trough = 0.30 mg/L    Follow-up  Level due:  in 2-3 days  A pharmacist will continue to monitor and order levels as appropriate    Please page service pharmacist with questions/clarifications.    Tacy Dura, PharmD Candidate

## 2020-06-29 NOTE — Unmapped (Signed)
Patient arrived to unit from ER. Contact precaution maintained.  Pt A&Ox4. VSS on RA. No acute changes occurred during this shift. Family at bedside.Port flushed and maintained. Pain managed with PRN PO. RT at bedside. Pt slept without complications. All safety measures are maintained. Will continue to monitor.     Problem: Adult Inpatient Plan of Care  Goal: Plan of Care Review  Outcome: Progressing  Goal: Patient-Specific Goal (Individualized)  Outcome: Progressing  Goal: Absence of Hospital-Acquired Illness or Injury  Outcome: Progressing  Intervention: Identify and Manage Fall Risk  Recent Flowsheet Documentation  Taken 06/28/2020 2200 by Darl Pikes, RN  Safety Interventions:   fall reduction program maintained   environmental modification   lighting adjusted for tasks/safety   low bed   nonskid shoes/slippers when out of bed   family at bedside  Intervention: Prevent and Manage VTE (Venous Thromboembolism) Risk  Recent Flowsheet Documentation  Taken 06/29/2020 0200 by Darl Pikes, RN  Activity Management: activity encouraged  Taken 06/29/2020 0000 by Darl Pikes, RN  Activity Management: activity encouraged  Taken 06/28/2020 2200 by Darl Pikes, RN  Activity Management: activity encouraged  Goal: Optimal Comfort and Wellbeing  Outcome: Progressing  Goal: Readiness for Transition of Care  Outcome: Progressing  Goal: Rounds/Family Conference  Outcome: Progressing     Problem: Infection  Goal: Absence of Infection Signs and Symptoms  Outcome: Progressing     Problem: Impaired Wound Healing  Goal: Optimal Wound Healing  Outcome: Progressing  Intervention: Promote Wound Healing  Recent Flowsheet Documentation  Taken 06/29/2020 0200 by Darl Pikes, RN  Activity Management: activity encouraged  Taken 06/29/2020 0000 by Darl Pikes, RN  Activity Management: activity encouraged  Taken 06/28/2020 2200 by Darl Pikes, RN  Activity Management: activity encouraged

## 2020-06-30 DIAGNOSIS — Z20822 Contact with and (suspected) exposure to covid-19: Principal | ICD-10-CM

## 2020-06-30 DIAGNOSIS — I110 Hypertensive heart disease with heart failure: Principal | ICD-10-CM

## 2020-06-30 DIAGNOSIS — Z91018 Allergy to other foods: Principal | ICD-10-CM

## 2020-06-30 DIAGNOSIS — E1165 Type 2 diabetes mellitus with hyperglycemia: Principal | ICD-10-CM

## 2020-06-30 DIAGNOSIS — Z902 Acquired absence of lung [part of]: Principal | ICD-10-CM

## 2020-06-30 DIAGNOSIS — R918 Other nonspecific abnormal finding of lung field: Principal | ICD-10-CM

## 2020-06-30 DIAGNOSIS — K8681 Exocrine pancreatic insufficiency: Principal | ICD-10-CM

## 2020-06-30 DIAGNOSIS — I509 Heart failure, unspecified: Principal | ICD-10-CM

## 2020-06-30 DIAGNOSIS — Z7951 Long term (current) use of inhaled steroids: Principal | ICD-10-CM

## 2020-06-30 DIAGNOSIS — Z794 Long term (current) use of insulin: Principal | ICD-10-CM

## 2020-06-30 DIAGNOSIS — Z6831 Body mass index (BMI) 31.0-31.9, adult: Principal | ICD-10-CM

## 2020-06-30 DIAGNOSIS — Z87891 Personal history of nicotine dependence: Principal | ICD-10-CM

## 2020-06-30 DIAGNOSIS — R791 Abnormal coagulation profile: Principal | ICD-10-CM

## 2020-06-30 DIAGNOSIS — I82509 Chronic embolism and thrombosis of unspecified deep veins of unspecified lower extremity: Principal | ICD-10-CM

## 2020-06-30 DIAGNOSIS — E669 Obesity, unspecified: Principal | ICD-10-CM

## 2020-06-30 DIAGNOSIS — J471 Bronchiectasis with (acute) exacerbation: Principal | ICD-10-CM

## 2020-06-30 DIAGNOSIS — F32A Depression, unspecified: Principal | ICD-10-CM

## 2020-06-30 DIAGNOSIS — K8689 Other specified diseases of pancreas: Principal | ICD-10-CM

## 2020-06-30 DIAGNOSIS — F419 Anxiety disorder, unspecified: Principal | ICD-10-CM

## 2020-06-30 DIAGNOSIS — E869 Volume depletion, unspecified: Principal | ICD-10-CM

## 2020-06-30 DIAGNOSIS — R058 Other specified cough: Principal | ICD-10-CM

## 2020-06-30 DIAGNOSIS — R0600 Dyspnea, unspecified: Principal | ICD-10-CM

## 2020-06-30 DIAGNOSIS — G2581 Restless legs syndrome: Principal | ICD-10-CM

## 2020-06-30 LAB — BASIC METABOLIC PANEL
ANION GAP: 7 mmol/L (ref 5–14)
BLOOD UREA NITROGEN: 13 mg/dL (ref 9–23)
BUN / CREAT RATIO: 18
CALCIUM: 8.8 mg/dL (ref 8.7–10.4)
CHLORIDE: 101 mmol/L (ref 98–107)
CO2: 29.2 mmol/L (ref 20.0–31.0)
CREATININE: 0.74 mg/dL
EGFR CKD-EPI AA MALE: >90 mL/min/{1.73_m2} (ref >=60)
EGFR CKD-EPI NON-AA MALE: >90 mL/min/{1.73_m2} (ref >=60)
GLUCOSE RANDOM: 162 mg/dL (ref 70–179)
POTASSIUM: 4 mmol/L (ref 3.4–4.5)
SODIUM: 137 mmol/L (ref 135–145)

## 2020-06-30 LAB — CBC
HEMATOCRIT: 35.1 % — ABNORMAL LOW (ref 38.0–50.0)
HEMOGLOBIN: 11.8 g/dL — ABNORMAL LOW (ref 13.5–17.5)
MEAN CORPUSCULAR HEMOGLOBIN CONC: 33.7 g/dL (ref 30.0–36.0)
MEAN CORPUSCULAR HEMOGLOBIN: 27.6 pg (ref 26.0–34.0)
MEAN CORPUSCULAR VOLUME: 81.8 fL (ref 81.0–95.0)
MEAN PLATELET VOLUME: 8.1 fL (ref 7.0–10.0)
PLATELET COUNT: 239 10*9/L (ref 150–450)
RED BLOOD CELL COUNT: 4.29 10*12/L — ABNORMAL LOW (ref 4.32–5.72)
RED CELL DISTRIBUTION WIDTH: 15.1 % — ABNORMAL HIGH (ref 12.0–15.0)
WBC ADJUSTED: 4.4 10*9/L (ref 3.5–10.5)

## 2020-06-30 LAB — TOBRAMYCIN LEVEL, RANDOM: TOBRAMYCIN RANDOM: 1.1 ug/mL

## 2020-06-30 LAB — MAGNESIUM: MAGNESIUM: 1.9 mg/dL (ref 1.6–2.6)

## 2020-06-30 LAB — TOBRAMYCIN LEVEL, PEAK: TOBRAMYCIN PEAK: 5.9 ug/mL (ref 4.0–10.0)

## 2020-06-30 MED ADMIN — cetirizine (ZyrTEC) tablet 10 mg: 10 mg | ORAL | @ 14:00:00 | Stop: 2020-07-04

## 2020-06-30 MED ADMIN — insulin lispro (HumaLOG) injection 30 Units: 30 [IU] | SUBCUTANEOUS | @ 19:00:00 | Stop: 2020-07-04

## 2020-06-30 MED ADMIN — pantoprazole (PROTONIX) EC tablet 20 mg: 20 mg | ORAL | @ 14:00:00 | Stop: 2020-07-04

## 2020-06-30 MED ADMIN — acetaminophen (TYLENOL) tablet 1,000 mg: 1000 mg | ORAL | @ 19:00:00 | Stop: 2020-07-04

## 2020-06-30 MED ADMIN — FLUoxetine (PROzac) capsule 60 mg: 60 mg | ORAL | @ 14:00:00 | Stop: 2020-07-04

## 2020-06-30 MED ADMIN — tobramycin (NEBCIN) 260 mg in sodium chloride (NS) 0.9 % 100 mL IVPB: 260 mg | INTRAVENOUS | @ 23:00:00 | Stop: 2020-07-04

## 2020-06-30 MED ADMIN — lipase-protease-amylase (CREON) 36,000-114,000- 180,000 unit capsule ***PATIENT SUPPLIED***: 8 | ORAL | @ 23:00:00 | Stop: 2020-07-04

## 2020-06-30 MED ADMIN — pramipexole (MIRAPEX) tablet 0.25 mg: .25 mg | ORAL | @ 03:00:00 | Stop: 2020-07-04

## 2020-06-30 MED ADMIN — insulin lispro (HumaLOG) injection 30 Units: 30 [IU] | SUBCUTANEOUS | @ 23:00:00 | Stop: 2020-07-04

## 2020-06-30 MED ADMIN — sodium chloride 3 % nebulizer solution 4 mL: 4 mL | RESPIRATORY_TRACT | @ 16:00:00 | Stop: 2020-07-04

## 2020-06-30 MED ADMIN — fluticasone propionate (FLONASE) 50 mcg/actuation nasal spray 1 spray: 1 | NASAL | @ 17:00:00 | Stop: 2020-07-04

## 2020-06-30 MED ADMIN — piperacillin-tazobactam (ZOSYN) IVPB (premix) 4.5 g: 4.5 g | INTRAVENOUS | @ 20:00:00 | Stop: 2020-07-04

## 2020-06-30 MED ADMIN — elexacaftor-tezacaftor-ivacaft (TRIKAFTA) tablet 2 tablet ***PATIENT SUPPLIED***: 2 | ORAL | @ 14:00:00 | Stop: 2020-07-04

## 2020-06-30 MED ADMIN — acetaminophen (TYLENOL) tablet 1,000 mg: 1000 mg | ORAL | @ 11:00:00 | Stop: 2020-07-04

## 2020-06-30 MED ADMIN — insulin lispro (HumaLOG) injection 0-12 Units: 0-12 [IU] | SUBCUTANEOUS | @ 15:00:00 | Stop: 2020-07-04

## 2020-06-30 MED ADMIN — oxyCODONE (ROXICODONE) immediate release tablet 5 mg: 5 mg | ORAL | @ 20:00:00 | Stop: 2020-07-01

## 2020-06-30 MED ADMIN — potassium chloride 20 mEq in 100 mL IVPB Premix: 20 meq | INTRAVENOUS | @ 03:00:00 | Stop: 2020-06-30

## 2020-06-30 MED ADMIN — atorvastatin (LIPITOR) tablet 20 mg: 20 mg | ORAL | @ 03:00:00 | Stop: 2020-07-04

## 2020-06-30 MED ADMIN — famotidine (PEPCID) tablet 20 mg: 20 mg | ORAL | @ 14:00:00 | Stop: 2020-07-04

## 2020-06-30 MED ADMIN — insulin lispro (HumaLOG) injection 0-12 Units: 0-12 [IU] | SUBCUTANEOUS | @ 03:00:00 | Stop: 2020-07-04

## 2020-06-30 MED ADMIN — albuterol 2.5 mg /3 mL (0.083 %) nebulizer solution 2.5 mg: 2.5 mg | RESPIRATORY_TRACT | @ 01:00:00 | Stop: 2020-07-04

## 2020-06-30 MED ADMIN — oxyCODONE (ROXICODONE) immediate release tablet 5 mg: 5 mg | ORAL | @ 03:00:00 | Stop: 2020-07-01

## 2020-06-30 MED ADMIN — insulin lispro (HumaLOG) injection 0-12 Units: 0-12 [IU] | SUBCUTANEOUS | @ 19:00:00 | Stop: 2020-07-04

## 2020-06-30 MED ADMIN — tobramycin (NEBCIN) 260 mg in sodium chloride (NS) 0.9 % 100 mL IVPB: 260 mg | INTRAVENOUS | @ 11:00:00 | Stop: 2020-07-04

## 2020-06-30 MED ADMIN — insulin glargine (LANTUS) injection 50 Units: 50 [IU] | SUBCUTANEOUS | @ 03:00:00 | Stop: 2020-07-04

## 2020-06-30 MED ADMIN — insulin lispro (HumaLOG) injection 0-12 Units: 0-12 [IU] | SUBCUTANEOUS | @ 23:00:00 | Stop: 2020-07-04

## 2020-06-30 MED ADMIN — rivaroxaban (XARELTO) tablet 20 mg: 20 mg | ORAL | @ 23:00:00 | Stop: 2020-07-04

## 2020-06-30 MED ADMIN — potassium chloride 20 mEq in 100 mL IVPB Premix: 20 meq | INTRAVENOUS | @ 12:00:00 | Stop: 2020-06-30

## 2020-06-30 MED ADMIN — pediatric multivitamin-vit D3 1,500 unit-vit K 800 mcg (MVW COMPLETE FORMULATION) capsule: 2 | ORAL | @ 03:00:00 | Stop: 2020-07-04

## 2020-06-30 MED ADMIN — acetaminophen (TYLENOL) tablet 1,000 mg: 1000 mg | ORAL | @ 03:00:00 | Stop: 2020-07-04

## 2020-06-30 MED ADMIN — lipase-protease-amylase (CREON) 36,000-114,000- 180,000 unit capsule ***PATIENT SUPPLIED***: 8 | ORAL | @ 19:00:00 | Stop: 2020-07-04

## 2020-06-30 MED ADMIN — piperacillin-tazobactam (ZOSYN) IVPB (premix) 4.5 g: 4.5 g | INTRAVENOUS | @ 03:00:00 | Stop: 2020-07-04

## 2020-06-30 MED ADMIN — gabapentin (NEURONTIN) capsule 600 mg: 600 mg | ORAL | @ 19:00:00 | Stop: 2020-07-04

## 2020-06-30 MED ADMIN — elexacaftor-tezacaftor-ivacaft (TRIKAFTA) tablet 1 tablet ***PATIENT SUPPLIED***: 1 | ORAL | @ 03:00:00 | Stop: 2020-07-04

## 2020-06-30 MED ADMIN — lipase-protease-amylase (CREON) 36,000-114,000- 180,000 unit capsule ***PATIENT SUPPLIED***: 8 | ORAL | @ 15:00:00 | Stop: 2020-07-04

## 2020-06-30 MED ADMIN — traZODone (DESYREL) tablet 100 mg: 100 mg | ORAL | @ 03:00:00 | Stop: 2020-07-04

## 2020-06-30 MED ADMIN — oxyCODONE (ROXICODONE) immediate release tablet 5 mg: 5 mg | ORAL | @ 15:00:00 | Stop: 2020-07-01

## 2020-06-30 MED ADMIN — sodium chloride 3 % nebulizer solution 4 mL: 4 mL | RESPIRATORY_TRACT | @ 01:00:00 | Stop: 2020-07-04

## 2020-06-30 MED ADMIN — gabapentin (NEURONTIN) capsule 600 mg: 600 mg | ORAL | @ 14:00:00 | Stop: 2020-07-04

## 2020-06-30 MED ADMIN — melatonin tablet 10.5 mg: 10 mg | ORAL | @ 03:00:00 | Stop: 2020-07-04

## 2020-06-30 MED ADMIN — lamoTRIgine (LaMICtal) tablet 200 mg: 200 mg | ORAL | @ 20:00:00 | Stop: 2020-07-04

## 2020-06-30 MED ADMIN — gabapentin (NEURONTIN) capsule 600 mg: 600 mg | ORAL | @ 03:00:00 | Stop: 2020-07-04

## 2020-06-30 MED ADMIN — piperacillin-tazobactam (ZOSYN) IVPB (premix) 4.5 g: 4.5 g | INTRAVENOUS | @ 15:00:00 | Stop: 2020-07-04

## 2020-06-30 MED ADMIN — pediatric multivitamin-vit D3 1,500 unit-vit K 800 mcg (MVW COMPLETE FORMULATION) capsule: 2 | ORAL | @ 14:00:00 | Stop: 2020-07-04

## 2020-06-30 MED ADMIN — montelukast (SINGULAIR) tablet 10 mg: 10 mg | ORAL | @ 03:00:00 | Stop: 2020-07-04

## 2020-06-30 MED ADMIN — amitriptyline (ELAVIL) tablet 100 mg: 100 mg | ORAL | @ 03:00:00 | Stop: 2020-07-04

## 2020-06-30 MED ADMIN — lisinopriL (PRINIVIL,ZESTRIL) tablet 10 mg: 10 mg | ORAL | @ 14:00:00 | Stop: 2020-07-04

## 2020-06-30 MED ADMIN — piperacillin-tazobactam (ZOSYN) IVPB (premix) 4.5 g: 4.5 g | INTRAVENOUS | @ 10:00:00 | Stop: 2020-07-04

## 2020-06-30 MED ADMIN — albuterol 2.5 mg /3 mL (0.083 %) nebulizer solution 2.5 mg: 2.5 mg | RESPIRATORY_TRACT | @ 16:00:00 | Stop: 2020-07-04

## 2020-06-30 MED ADMIN — insulin lispro (HumaLOG) injection 30 Units: 30 [IU] | SUBCUTANEOUS | @ 15:00:00 | Stop: 2020-07-04

## 2020-06-30 MED ADMIN — fluticasone furoate-vilanteroL (BREO ELLIPTA) 200-25 mcg/dose inhaler 1 puff: 1 | RESPIRATORY_TRACT | @ 16:00:00 | Stop: 2020-07-04

## 2020-06-30 NOTE — Unmapped (Signed)
No acute changes overnight   Isolation precautions maintained  Picc line dry, intact not removed      Problem: Adult Inpatient Plan of Care  Goal: Plan of Care Review  Outcome: Progressing  Goal: Patient-Specific Goal (Individualized)  Outcome: Progressing  Goal: Absence of Hospital-Acquired Illness or Injury  Outcome: Progressing  Intervention: Identify and Manage Fall Risk  Recent Flowsheet Documentation  Taken 06/29/2020 2000 by Claudie Fisherman, RN  Safety Interventions:   low bed   isolation precautions  Intervention: Prevent and Manage VTE (Venous Thromboembolism) Risk  Recent Flowsheet Documentation  Taken 06/29/2020 2200 by Claudie Fisherman, RN  Activity Management:   activity encouraged   activity adjusted per tolerance  Taken 06/29/2020 2000 by Claudie Fisherman, RN  Activity Management:   activity adjusted per tolerance   activity encouraged  Goal: Optimal Comfort and Wellbeing  Outcome: Progressing  Goal: Readiness for Transition of Care  Outcome: Progressing  Goal: Rounds/Family Conference  Outcome: Progressing     Problem: Infection  Goal: Absence of Infection Signs and Symptoms  Outcome: Progressing  Intervention: Prevent or Manage Infection  Recent Flowsheet Documentation  Taken 06/29/2020 2000 by Claudie Fisherman, RN  Isolation Precautions: contact precautions maintained     Problem: Impaired Wound Healing  Goal: Optimal Wound Healing  Outcome: Progressing  Intervention: Promote Wound Healing  Recent Flowsheet Documentation  Taken 06/29/2020 2200 by Claudie Fisherman, RN  Activity Management:   activity encouraged   activity adjusted per tolerance  Taken 06/29/2020 2000 by Claudie Fisherman, RN  Activity Management:   activity adjusted per tolerance   activity encouraged

## 2020-06-30 NOTE — Unmapped (Signed)
Pt A/O X 4, VSS. Pain managed with PRN OXY, see MAR. Pt tolerating diet and voiding spontaneously. Pt ambulated in the room. No new concerns at this time.     Problem: Adult Inpatient Plan of Care  Goal: Plan of Care Review  Outcome: Progressing  Goal: Patient-Specific Goal (Individualized)  Outcome: Progressing  Goal: Absence of Hospital-Acquired Illness or Injury  Outcome: Progressing  Goal: Optimal Comfort and Wellbeing  Outcome: Progressing  Goal: Readiness for Transition of Care  Outcome: Progressing  Goal: Rounds/Family Conference  Outcome: Progressing     Problem: Infection  Goal: Absence of Infection Signs and Symptoms  Outcome: Progressing     Problem: Impaired Wound Healing  Goal: Optimal Wound Healing  Outcome: Progressing

## 2020-06-30 NOTE — Unmapped (Signed)
Hospital Medicine Daily Progress Note    Assessment/Plan:    Principal Problem:    Cystic fibrosis with pulmonary exacerbation (CMS-HCC)  Active Problems:    Essential hypertension    Depressive disorder    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)    Pancreatic insufficiency due to cystic fibrosis (CMS-HCC)    History of Mycobacterium abscessus infection    Chronic deep vein thrombosis (DVT) of lower extremity (CMS-HCC)    Obesity (BMI 30-39.9)    Restless leg syndrome  Resolved Problems:    * No resolved hospital problems. *   Malnutrition Evaluation as performed by RD, LDN: Patient does not meet AND/ASPEN criteria for malnutrition at this time (06/29/20 1131)             Christian Young is a 34 y.o. male that presented to [facility] with Cystic fibrosis with pulmonary exacerbation (CMS-HCC).    Acute Exacerbation of Bronchiectasis, CF: COVID/flu/RSV negative. CXR with increased bibasilar opacities compared to prior imaging. Still with ongoing dyspnea on exertion, congestion, and cough.   - Antibiotics: Zosyn and IV tobramycyin (11/16 - )  - Airway clearance: HTS+albuterol, daily Pulmozyme, chest vest  - Continue home Trikafta  - Continue home cetirizine, montelukast  - Home Symbicort substituted with formulary Breo Ellipta  - Holding tobi nebs while on IV antibiotics  - F/U repeat CT sputum culture, AFB  - PFTs to be completed today, will monitor weekly while admitted    CF Related Pancreatic insufficiency:  - Continue pancreatic enzymes (home formulary)  - Continue PO vitamin K - will increase to daily  ??  CF Related Diabetes: Was hyperglycemic yesterday but overall improved control today. Fasting AM BG acceptable, appears to be more hyperglycemia with meals. Will CTM SSI needs and re-assess insulin dose tomorrow.   - Continue home lantus 50 units at bedtime  - Continue home lispro 30 units TID AC  - Lispro sliding scale ACHS    Anxiety/Depression: Continue fluoxetine, lamotrigine, amitriptyline    Hypertension: Continue lisinopril    History of DVT: Continue home Xarelto    RLS: Continue home pramipexole, gabapentin    Hypokalemia, Hypomagnesemia: Improved with repletion. CTM and replete PRN    FEN/GI:  - High protein/high calorie diet  - No IVF  - Replete lytes PRN    PPx: Home Xarelto    Advanced care planning.  - Code status: FULL CODE  - Healthcare proxy: Patient's spouse, Aras Albarran, [phone].  ___________________________________________________________________    Subjective:  No acute issues overnight. Still having some chest pain, most notably at night when he is sleeping and will wake up with pain from coughing. Oxycodone has been helpful. Appetite is good. No change in cough. Denies hemoptysis.     Labs/Studies:  Labs and Studies from the last 24hrs per EMR and Reviewed    Objective:  Temp:  [36.8 ??C (98.2 ??F)-36.8 ??C (98.3 ??F)] 36.8 ??C (98.2 ??F)  Heart Rate:  [77-104] 77  Resp:  [16-18] 16  BP: (111-121)/(59-67) 117/67  SpO2:  [95 %-96 %] 96 %    Gen: Awake and alert. NAD. Non-toxic.  HEENT: EOMI, sclera anicteric. MMM.  Chest: L chest port accessed, clean overlying dressing.   Resp: Normal WOB. Equal air movement bilaterally. CTAB  CV: RRR, normal s1s2, no m/g/r.   Abd: Soft, NTND, normoactive BS.  Ext: WWP. No c/c/e.
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
## 2020-07-01 LAB — CBC
HEMATOCRIT: 36.1 % — ABNORMAL LOW (ref 38.0–50.0)
HEMOGLOBIN: 12 g/dL — ABNORMAL LOW (ref 13.5–17.5)
MEAN CORPUSCULAR HEMOGLOBIN CONC: 33.1 g/dL (ref 30.0–36.0)
MEAN CORPUSCULAR HEMOGLOBIN: 27.3 pg (ref 26.0–34.0)
MEAN CORPUSCULAR VOLUME: 82.3 fL (ref 81.0–95.0)
MEAN PLATELET VOLUME: 8.1 fL (ref 7.0–10.0)
PLATELET COUNT: 263 10*9/L (ref 150–450)
RED BLOOD CELL COUNT: 4.39 10*12/L (ref 4.32–5.72)
RED CELL DISTRIBUTION WIDTH: 14.9 % (ref 12.0–15.0)
WBC ADJUSTED: 4.8 10*9/L (ref 3.5–10.5)

## 2020-07-01 LAB — BASIC METABOLIC PANEL
ANION GAP: 6 mmol/L (ref 5–14)
BLOOD UREA NITROGEN: 10 mg/dL (ref 9–23)
BUN / CREAT RATIO: 13
CALCIUM: 8.7 mg/dL (ref 8.7–10.4)
CHLORIDE: 102 mmol/L (ref 98–107)
CO2: 29.2 mmol/L (ref 20.0–31.0)
CREATININE: 0.8 mg/dL
EGFR CKD-EPI AA MALE: >90 mL/min/{1.73_m2} (ref >=60)
EGFR CKD-EPI NON-AA MALE: >90 mL/min/{1.73_m2} (ref >=60)
GLUCOSE RANDOM: 197 mg/dL — ABNORMAL HIGH (ref 70–179)
POTASSIUM: 4.1 mmol/L (ref 3.4–4.5)
SODIUM: 137 mmol/L (ref 135–145)

## 2020-07-01 LAB — MAGNESIUM: MAGNESIUM: 1.6 mg/dL (ref 1.6–2.6)

## 2020-07-01 MED ADMIN — pediatric multivitamin-vit D3 1,500 unit-vit K 800 mcg (MVW COMPLETE FORMULATION) capsule: 2 | ORAL | @ 14:00:00 | Stop: 2020-07-04

## 2020-07-01 MED ADMIN — acetaminophen (TYLENOL) tablet 1,000 mg: 1000 mg | ORAL | @ 19:00:00 | Stop: 2020-07-04

## 2020-07-01 MED ADMIN — FLUoxetine (PROzac) capsule 60 mg: 60 mg | ORAL | @ 14:00:00 | Stop: 2020-07-04

## 2020-07-01 MED ADMIN — oxyCODONE (ROXICODONE) immediate release tablet 5 mg: 5 mg | ORAL | @ 03:00:00 | Stop: 2020-07-01

## 2020-07-01 MED ADMIN — gabapentin (NEURONTIN) capsule 600 mg: 600 mg | ORAL | @ 14:00:00 | Stop: 2020-07-04

## 2020-07-01 MED ADMIN — insulin lispro (HumaLOG) injection 30 Units: 30 [IU] | SUBCUTANEOUS | Stop: 2020-07-04

## 2020-07-01 MED ADMIN — fluticasone propionate (FLONASE) 50 mcg/actuation nasal spray 1 spray: 1 | NASAL | @ 14:00:00 | Stop: 2020-07-04

## 2020-07-01 MED ADMIN — lipase-protease-amylase (CREON) 36,000-114,000- 180,000 unit capsule ***PATIENT SUPPLIED***: 8 | ORAL | @ 14:00:00 | Stop: 2020-07-04

## 2020-07-01 MED ADMIN — gabapentin (NEURONTIN) capsule 600 mg: 600 mg | ORAL | @ 19:00:00 | Stop: 2020-07-04

## 2020-07-01 MED ADMIN — sodium chloride 3 % nebulizer solution 4 mL: 4 mL | RESPIRATORY_TRACT | Stop: 2020-07-04

## 2020-07-01 MED ADMIN — albuterol 2.5 mg /3 mL (0.083 %) nebulizer solution 2.5 mg: 2.5 mg | RESPIRATORY_TRACT | @ 19:00:00 | Stop: 2020-07-04

## 2020-07-01 MED ADMIN — phytonadione (vitamin K1) (MEPHYTON) tablet 5 mg: 5 mg | ORAL | @ 15:00:00 | Stop: 2020-07-04

## 2020-07-01 MED ADMIN — acetaminophen (TYLENOL) tablet 1,000 mg: 1000 mg | ORAL | @ 11:00:00 | Stop: 2020-07-04

## 2020-07-01 MED ADMIN — dornase alfa (PULMOZYME) 1 mg/mL solution 2.5 mg: 2.5 mg | RESPIRATORY_TRACT | @ 16:00:00 | Stop: 2020-07-04

## 2020-07-01 MED ADMIN — pramipexole (MIRAPEX) tablet 0.25 mg: .25 mg | ORAL | @ 03:00:00 | Stop: 2020-07-04

## 2020-07-01 MED ADMIN — insulin lispro (HumaLOG) injection 30 Units: 30 [IU] | SUBCUTANEOUS | @ 19:00:00 | Stop: 2020-07-04

## 2020-07-01 MED ADMIN — lipase-protease-amylase (CREON) 36,000-114,000- 180,000 unit capsule ***PATIENT SUPPLIED***: 8 | ORAL | Stop: 2020-07-04

## 2020-07-01 MED ADMIN — elexacaftor-tezacaftor-ivacaft (TRIKAFTA) tablet 1 tablet ***PATIENT SUPPLIED***: 1 | ORAL | @ 03:00:00 | Stop: 2020-07-04

## 2020-07-01 MED ADMIN — pantoprazole (PROTONIX) EC tablet 20 mg: 20 mg | ORAL | @ 14:00:00 | Stop: 2020-07-04

## 2020-07-01 MED ADMIN — montelukast (SINGULAIR) tablet 10 mg: 10 mg | ORAL | @ 03:00:00 | Stop: 2020-07-04

## 2020-07-01 MED ADMIN — rivaroxaban (XARELTO) tablet 20 mg: 20 mg | ORAL | Stop: 2020-07-04

## 2020-07-01 MED ADMIN — cetirizine (ZyrTEC) tablet 10 mg: 10 mg | ORAL | @ 14:00:00 | Stop: 2020-07-04

## 2020-07-01 MED ADMIN — insulin lispro (HumaLOG) injection 0-12 Units: 0-12 [IU] | SUBCUTANEOUS | Stop: 2020-07-04

## 2020-07-01 MED ADMIN — sodium chloride 3 % nebulizer solution 4 mL: 4 mL | RESPIRATORY_TRACT | @ 02:00:00 | Stop: 2020-07-04

## 2020-07-01 MED ADMIN — oxyCODONE (ROXICODONE) immediate release tablet 5 mg: 5 mg | ORAL | @ 15:00:00 | Stop: 2020-07-01

## 2020-07-01 MED ADMIN — fluticasone furoate-vilanteroL (BREO ELLIPTA) 200-25 mcg/dose inhaler 1 puff: 1 | RESPIRATORY_TRACT | @ 14:00:00 | Stop: 2020-07-04

## 2020-07-01 MED ADMIN — albuterol 2.5 mg /3 mL (0.083 %) nebulizer solution 2.5 mg: 2.5 mg | RESPIRATORY_TRACT | @ 14:00:00 | Stop: 2020-07-04

## 2020-07-01 MED ADMIN — oxyCODONE (ROXICODONE) immediate release tablet 5 mg: 5 mg | ORAL | @ 11:00:00 | Stop: 2020-07-01

## 2020-07-01 MED ADMIN — piperacillin-tazobactam (ZOSYN) IVPB (premix) 4.5 g: 4.5 g | INTRAVENOUS | @ 03:00:00 | Stop: 2020-07-04

## 2020-07-01 MED ADMIN — atorvastatin (LIPITOR) tablet 20 mg: 20 mg | ORAL | @ 03:00:00 | Stop: 2020-07-04

## 2020-07-01 MED ADMIN — traZODone (DESYREL) tablet 100 mg: 100 mg | ORAL | @ 03:00:00 | Stop: 2020-07-04

## 2020-07-01 MED ADMIN — lisinopriL (PRINIVIL,ZESTRIL) tablet 10 mg: 10 mg | ORAL | @ 14:00:00 | Stop: 2020-07-04

## 2020-07-01 MED ADMIN — gabapentin (NEURONTIN) capsule 600 mg: 600 mg | ORAL | @ 03:00:00 | Stop: 2020-07-04

## 2020-07-01 MED ADMIN — amitriptyline (ELAVIL) tablet 100 mg: 100 mg | ORAL | @ 03:00:00 | Stop: 2020-07-04

## 2020-07-01 MED ADMIN — piperacillin-tazobactam (ZOSYN) IVPB (premix) 4.5 g: 4.5 g | INTRAVENOUS | @ 19:00:00 | Stop: 2020-07-04

## 2020-07-01 MED ADMIN — sodium chloride 3 % nebulizer solution 4 mL: 4 mL | RESPIRATORY_TRACT | @ 14:00:00 | Stop: 2020-07-04

## 2020-07-01 MED ADMIN — insulin lispro (HumaLOG) injection 0-12 Units: 0-12 [IU] | SUBCUTANEOUS | @ 19:00:00 | Stop: 2020-07-04

## 2020-07-01 MED ADMIN — sodium chloride 3 % nebulizer solution 4 mL: 4 mL | RESPIRATORY_TRACT | @ 19:00:00 | Stop: 2020-07-04

## 2020-07-01 MED ADMIN — oxyCODONE (ROXICODONE) immediate release tablet 5 mg: 5 mg | ORAL | Stop: 2020-07-04

## 2020-07-01 MED ADMIN — melatonin tablet 10.5 mg: 10 mg | ORAL | @ 03:00:00 | Stop: 2020-07-04

## 2020-07-01 MED ADMIN — tobramycin (NEBCIN) 260 mg in sodium chloride (NS) 0.9 % 100 mL IVPB: 260 mg | INTRAVENOUS | @ 11:00:00 | Stop: 2020-07-04

## 2020-07-01 MED ADMIN — acetaminophen (TYLENOL) tablet 1,000 mg: 1000 mg | ORAL | @ 03:00:00 | Stop: 2020-07-04

## 2020-07-01 MED ADMIN — tobramycin (NEBCIN) 260 mg in sodium chloride (NS) 0.9 % 100 mL IVPB: 260 mg | INTRAVENOUS | Stop: 2020-07-04

## 2020-07-01 MED ADMIN — piperacillin-tazobactam (ZOSYN) IVPB (premix) 4.5 g: 4.5 g | INTRAVENOUS | @ 14:00:00 | Stop: 2020-07-04

## 2020-07-01 MED ADMIN — piperacillin-tazobactam (ZOSYN) IVPB (premix) 4.5 g: 4.5 g | INTRAVENOUS | @ 08:00:00 | Stop: 2020-07-04

## 2020-07-01 MED ADMIN — albuterol 2.5 mg /3 mL (0.083 %) nebulizer solution 2.5 mg: 2.5 mg | RESPIRATORY_TRACT | Stop: 2020-07-04

## 2020-07-01 MED ADMIN — elexacaftor-tezacaftor-ivacaft (TRIKAFTA) tablet 2 tablet ***PATIENT SUPPLIED***: 2 | ORAL | @ 14:00:00 | Stop: 2020-07-04

## 2020-07-01 MED ADMIN — lipase-protease-amylase (CREON) 36,000-114,000- 180,000 unit capsule ***PATIENT SUPPLIED***: 8 | ORAL | @ 19:00:00 | Stop: 2020-07-04

## 2020-07-01 MED ADMIN — albuterol 2.5 mg /3 mL (0.083 %) nebulizer solution 2.5 mg: 2.5 mg | RESPIRATORY_TRACT | @ 02:00:00 | Stop: 2020-07-04

## 2020-07-01 MED ADMIN — lamoTRIgine (LaMICtal) tablet 200 mg: 200 mg | ORAL | @ 14:00:00 | Stop: 2020-07-04

## 2020-07-01 MED ADMIN — insulin glargine (LANTUS) injection 50 Units: 50 [IU] | SUBCUTANEOUS | @ 03:00:00 | Stop: 2020-07-04

## 2020-07-01 MED ADMIN — famotidine (PEPCID) tablet 20 mg: 20 mg | ORAL | @ 14:00:00 | Stop: 2020-07-04

## 2020-07-01 MED ADMIN — pediatric multivitamin-vit D3 1,500 unit-vit K 800 mcg (MVW COMPLETE FORMULATION) capsule: 2 | ORAL | @ 03:00:00 | Stop: 2020-07-04

## 2020-07-01 MED ADMIN — insulin lispro (HumaLOG) injection 30 Units: 30 [IU] | SUBCUTANEOUS | @ 14:00:00 | Stop: 2020-07-04

## 2020-07-01 MED ADMIN — insulin lispro (HumaLOG) injection 0-12 Units: 0-12 [IU] | SUBCUTANEOUS | @ 14:00:00 | Stop: 2020-07-04

## 2020-07-01 NOTE — Unmapped (Signed)
No acute issues overnight   Isolation orders maintained   IV Port dressing intact, not removed      Problem: Adult Inpatient Plan of Care  Goal: Plan of Care Review  Outcome: Progressing  Goal: Patient-Specific Goal (Individualized)  Outcome: Progressing  Goal: Absence of Hospital-Acquired Illness or Injury  Outcome: Progressing  Intervention: Identify and Manage Fall Risk  Recent Flowsheet Documentation  Taken 06/30/2020 2000 by Claudie Fisherman, RN  Safety Interventions:   low bed   fall reduction program maintained   isolation precautions  Intervention: Prevent and Manage VTE (Venous Thromboembolism) Risk  Recent Flowsheet Documentation  Taken 06/30/2020 2000 by Claudie Fisherman, RN  Activity Management:   activity encouraged   activity adjusted per tolerance  Goal: Optimal Comfort and Wellbeing  Outcome: Progressing  Goal: Readiness for Transition of Care  Outcome: Progressing  Goal: Rounds/Family Conference  Outcome: Progressing     Problem: Infection  Goal: Absence of Infection Signs and Symptoms  Outcome: Progressing  Intervention: Prevent or Manage Infection  Recent Flowsheet Documentation  Taken 06/30/2020 2000 by Claudie Fisherman, RN  Isolation Precautions: contact precautions maintained     Problem: Impaired Wound Healing  Goal: Optimal Wound Healing  Outcome: Progressing  Intervention: Promote Wound Healing  Recent Flowsheet Documentation  Taken 06/30/2020 2000 by Claudie Fisherman, RN  Activity Management:   activity encouraged   activity adjusted per tolerance     Problem: Fall Injury Risk  Goal: Absence of Fall and Fall-Related Injury  Outcome: Progressing  Intervention: Promote Injury-Free Environment  Recent Flowsheet Documentation  Taken 06/30/2020 2000 by Claudie Fisherman, RN  Safety Interventions:   low bed   fall reduction program maintained   isolation precautions

## 2020-07-01 NOTE — Unmapped (Signed)
Aminoglycoside Therapeutic Monitoring Pharmacy Note    Christian Young is a 34 y.o. male continuing tobramycin. Date of therapy initiation: 06/28/20    Indication: CF exacerbation    Prior Dosing Information: Current regimen 260mg  q12h      Goals:  Therapeutic Drug Levels  Trough level: tobramycin <1 mg/L  Peak level: tobramycin 10-14 mg/L    Additional Clinical Monitoring/Outcomes  Renal function, volume status (intake and output)    Results:   Random level: 1.1 mg/L, drawn 14:23 (Extrapolated trough: 0.398 mg/L)   Peak level: 5.9 mg/L, drawn 08:37 (Extrapolated Peak: 10.3 mg/L)    Wt Readings from Last 1 Encounters:   06/28/20 (!) 105.8 kg (233 lb 4.8 oz)     Lab Results   Component Value Date    CREATININE 0.74 06/30/2020       Pharmacokinetic Considerations and Significant Drug Interactions:  Adult (calculated on 06/30/2020): Vd = 23.7 L, ke = 0.29 hr-1  Concurrent nephrotoxic meds:  Zosyn    Assessment/Plan:  Recommendation(s)  Continue current regimen of 260 mg q12h  Estimated peak and trough on recommended regimen: peak = 10.3 mg/L, trough = 0.45 mg/L    Follow-up  Level due: in 3-5 days  A pharmacist will continue to monitor and order levels as appropriate    Please page service pharmacist with questions/clarifications.    Tacy Dura, PharmD Candidate

## 2020-07-01 NOTE — Unmapped (Signed)
Pt A/O X 4, VSS. Pain managed with PRN OXY, see MAR. Pt tolerating diet and voiding spontaneously. Pt ambulated in the room. No new concerns at this time.    Problem: Adult Inpatient Plan of Care  Goal: Plan of Care Review  Outcome: Progressing  Goal: Patient-Specific Goal (Individualized)  Outcome: Progressing  Goal: Absence of Hospital-Acquired Illness or Injury  Outcome: Progressing  Intervention: Identify and Manage Fall Risk  Recent Flowsheet Documentation  Taken 06/30/2020 1800 by Johnn Hai, RN  Safety Interventions:   low bed   fall reduction program maintained  Taken 06/30/2020 1600 by Johnn Hai, RN  Safety Interventions:   fall reduction program maintained   low bed  Taken 06/30/2020 1400 by Johnn Hai, RN  Safety Interventions:   fall reduction program maintained   low bed  Taken 06/30/2020 1200 by Johnn Hai, RN  Safety Interventions:   low bed   fall reduction program maintained  Taken 06/30/2020 1000 by Johnn Hai, RN  Safety Interventions:   low bed   fall reduction program maintained  Taken 06/30/2020 0800 by Johnn Hai, RN  Safety Interventions:   fall reduction program maintained   low bed  Intervention: Prevent and Manage VTE (Venous Thromboembolism) Risk  Recent Flowsheet Documentation  Taken 06/30/2020 1800 by Johnn Hai, RN  Activity Management:   activity adjusted per tolerance   activity encouraged  Taken 06/30/2020 1600 by Johnn Hai, RN  Activity Management:   activity adjusted per tolerance   activity encouraged  Taken 06/30/2020 1400 by Johnn Hai, RN  Activity Management:   activity adjusted per tolerance   activity encouraged   ambulated to bathroom   ambulated outside room  Taken 06/30/2020 1200 by Johnn Hai, RN  Activity Management:   activity adjusted per tolerance   activity encouraged  Taken 06/30/2020 1000 by Johnn Hai, RN  Activity Management:   activity adjusted per tolerance   activity encouraged   ambulated in room  Taken 06/30/2020 0800 by Johnn Hai, RN  Activity Management:   activity adjusted per tolerance   activity encouraged  Goal: Optimal Comfort and Wellbeing  Outcome: Progressing  Goal: Readiness for Transition of Care  Outcome: Progressing  Goal: Rounds/Family Conference  Outcome: Progressing     Problem: Infection  Goal: Absence of Infection Signs and Symptoms  Outcome: Progressing     Problem: Impaired Wound Healing  Goal: Optimal Wound Healing  Outcome: Progressing  Intervention: Promote Wound Healing  Recent Flowsheet Documentation  Taken 06/30/2020 1800 by Johnn Hai, RN  Activity Management:   activity adjusted per tolerance   activity encouraged  Taken 06/30/2020 1600 by Johnn Hai, RN  Activity Management:   activity adjusted per tolerance   activity encouraged  Taken 06/30/2020 1400 by Johnn Hai, RN  Activity Management:   activity adjusted per tolerance   activity encouraged   ambulated to bathroom   ambulated outside room  Taken 06/30/2020 1200 by Johnn Hai, RN  Activity Management:   activity adjusted per tolerance   activity encouraged  Taken 06/30/2020 1000 by Johnn Hai, RN  Activity Management:   activity adjusted per tolerance   activity encouraged   ambulated in room  Taken 06/30/2020 0800 by Johnn Hai, RN  Activity Management:   activity adjusted per tolerance   activity encouraged     Problem: Fall Injury Risk  Goal: Absence of Fall and Fall-Related Injury  Outcome: Progressing  Intervention: Promote Injury-Free Environment  Recent Flowsheet Documentation  Taken 06/30/2020 1800 by Wilford Sports  Reginold Agent, RN  Safety Interventions:   low bed   fall reduction program maintained  Taken 06/30/2020 1600 by Johnn Hai, RN  Safety Interventions:   fall reduction program maintained   low bed  Taken 06/30/2020 1400 by Johnn Hai, RN  Safety Interventions:   fall reduction program maintained   low bed  Taken 06/30/2020 1200 by Johnn Hai, RN  Safety Interventions:   low bed   fall reduction program maintained  Taken 06/30/2020 1000 by Johnn Hai, RN  Safety Interventions:   low bed   fall reduction program maintained  Taken 06/30/2020 0800 by Johnn Hai, RN  Safety Interventions:   fall reduction program maintained   low bed

## 2020-07-01 NOTE — Unmapped (Signed)
Hospital Medicine Daily Progress Note    Assessment/Plan:    Principal Problem:    Cystic fibrosis with pulmonary exacerbation (CMS-HCC)  Active Problems:    Essential hypertension    Depressive disorder    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)    Pancreatic insufficiency due to cystic fibrosis (CMS-HCC)    History of Mycobacterium abscessus infection    Chronic deep vein thrombosis (DVT) of lower extremity (CMS-HCC)    Obesity (BMI 30-39.9)    Restless leg syndrome  Resolved Problems:    * No resolved hospital problems. *   Malnutrition Evaluation as performed by RD, LDN: Patient does not meet AND/ASPEN criteria for malnutrition at this time (06/29/20 1131)             Christian Young is a 34 y.o. male that presented to [facility] with Cystic fibrosis with pulmonary exacerbation (CMS-HCC).    Acute Exacerbation of Bronchiectasis, CF: COVID/flu/RSV negative. CXR with increased bibasilar opacities compared to prior imaging. Still with ongoing dyspnea on exertion, congestion, and cough.   - Antibiotics: Zosyn and IV tobramycyin (11/16 - )  - Airway clearance: HTS+albuterol, daily Pulmozyme, chest vest  - Continue home Trikafta  - Continue home cetirizine, montelukast  - Home Symbicort substituted with formulary Breo Ellipta  - Holding tobi nebs while on IV antibiotics  - F/U repeat CT sputum culture, AFB  - PFTs completed 11/18  - Plan to discharge Monday with home infusion to complete remainder of antibiotic course    CF Related Pancreatic insufficiency:  - Continue pancreatic enzymes (home formulary)  - Continue PO vitamin K daily  ??  CF Related Diabetes: Overall better glycemic control, although still with some elevated readings.   - Continue home lantus 50 units at bedtime  - Continue home lispro 30 units TID AC  - Lispro sliding scale ACHS    Anxiety/Depression: Continue fluoxetine, lamotrigine, amitriptyline    Hypertension: Continue lisinopril    History of DVT: Continue home Xarelto    RLS: Continue home pramipexole, gabapentin    FEN/GI:  - High protein/high calorie diet  - No IVF  - Replete lytes PRN    PPx: Home Xarelto  ___________________________________________________________________    Subjective:  Still complaining of chest pain. After his PFTs yesterday his pain did get worse. Oxycodone has been helpful. No hemoptysis. Appetite is good. No nausea, vomiting.     Labs/Studies:  Labs and Studies from the last 24hrs per EMR and Reviewed    Objective:  Temp:  [36 ??C (96.8 ??F)-36.7 ??C (98.1 ??F)] 36.5 ??C (97.7 ??F)  Heart Rate:  [72-94] 86  Resp:  [16-18] 18  BP: (112-132)/(65-76) 122/66  SpO2:  [94 %-97 %] 97 %    Gen: Sitting up in bed. Awake and alert. NAD. Non-toxic.  HEENT: EOMI, sclera anicteric. MMM.  Chest: L chest port accessed, clean overlying dressing.   Resp: Normal WOB. Equal air movement bilaterally. CTAB  CV: RRR, normal s1s2, no m/g/r.   Abd: Soft, NTND, normoactive BS.  Ext: WWP. No edema.

## 2020-07-01 NOTE — Unmapped (Signed)
Antibiotic Timeout Checklist  Indication for antibiotics: Tobramycin  Antibiotic Start Date: 11/17  Current systemic antibiotics: Tobramycin/Zosyn  Microbiology Results: NA  Sensitivities Available? NA  Are antibiotics still indicated? YES  Is it appropriate to de-escalate? NO  Is it appropriate to convert to PO therapy? NO  Today's antibiotic plan: No change  Planned Antibiotic Duration: 14 days
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
## 2020-07-01 NOTE — Unmapped (Signed)
Disciplines requested: Nursing and Home IV Antibiotics    Physician to follow patient's care (the person listed here will be responsible for signing ongoing orders): Maia Plan, MD    Inpatient Start Date of Antibiotics (Day 1): 06/28/20  Start of Care Date (discharge date): 07/04/20  Estimate completion of therapy: 07/11/20  Home health/infusion please reach out to nurse contact (below) 2-3 days prior to end of therapy for appropriate orders    IV ANTIBIOTIC ORDERS:  ??? Tobramycin 260mg  IV every 12 hours (Conventional dosing, infuse over 30 minutes)  ??? Piperacillin-tazobactam (Zosyn) 4.5g IV every 6 hours    **DO NOT DRAW DRUG LEVELS FROM PORT**    LAB MONITORING ORDERS:  Start labs on 07/07/20 (or 07/06/20 given the Thanksgiving Holiday)    Collect on MONDAYS of each week:  ??? CBC + diff  ??? Magnesium  ??? Basic Metabolic Panel  ??? Tobramycin Trough Level (collect prior to START of infusion)    Collect on THURSDAYS of each week (only for patients on aminoglycosides)  ??? Magnesium  ??? Basic Metabolic Panel  ??? Tobramycin Peak Level (collect 1 hour after end of infusion)    TARGET DRUG LEVELS:  ??? -Tobramycin (Conventional Dosing): Target end of infusion peak: 10-14 mcg/mL, trough < 1 mcg/mL    --------------------------------------------------------------------------------------  *PLEASE FAX RESULTS TO: [phone]    *PHONE CONTACT*:  ??? Adult CF Patients (Last name L to Z): Nurse Coordinator-Nancy Efland, RN [phone]  ??? For critical lab results or urgent matters outside of regular business hours (Mon through Fri 8:00AM-4:30PM, please call the hospital operator at [phone] and ask to page the pulmonary fellow on-call.    **Please warm hand-off to outpatient team nurse coordinators per contact list above**

## 2020-07-02 LAB — CBC
HEMATOCRIT: 34.8 % — ABNORMAL LOW (ref 38.0–50.0)
HEMOGLOBIN: 11.5 g/dL — ABNORMAL LOW (ref 13.5–17.5)
MEAN CORPUSCULAR HEMOGLOBIN CONC: 32.9 g/dL (ref 30.0–36.0)
MEAN CORPUSCULAR HEMOGLOBIN: 27 pg (ref 26.0–34.0)
MEAN CORPUSCULAR VOLUME: 82.1 fL (ref 81.0–95.0)
MEAN PLATELET VOLUME: 8.3 fL (ref 7.0–10.0)
PLATELET COUNT: 258 10*9/L (ref 150–450)
RED BLOOD CELL COUNT: 4.24 10*12/L — ABNORMAL LOW (ref 4.32–5.72)
RED CELL DISTRIBUTION WIDTH: 15.1 % — ABNORMAL HIGH (ref 12.0–15.0)
WBC ADJUSTED: 4.8 10*9/L (ref 3.5–10.5)

## 2020-07-02 LAB — BASIC METABOLIC PANEL
ANION GAP: 7 mmol/L (ref 5–14)
BLOOD UREA NITROGEN: 12 mg/dL (ref 9–23)
BUN / CREAT RATIO: 14
CALCIUM: 9 mg/dL (ref 8.7–10.4)
CHLORIDE: 101 mmol/L (ref 98–107)
CO2: 30.8 mmol/L (ref 20.0–31.0)
CREATININE: 0.85 mg/dL
EGFR CKD-EPI AA MALE: >90 mL/min/{1.73_m2} (ref >=60)
EGFR CKD-EPI NON-AA MALE: >90 mL/min/{1.73_m2} (ref >=60)
GLUCOSE RANDOM: 128 mg/dL (ref 70–179)
POTASSIUM: 3.8 mmol/L (ref 3.4–4.5)
SODIUM: 139 mmol/L (ref 135–145)

## 2020-07-02 LAB — MAGNESIUM: MAGNESIUM: 1.6 mg/dL (ref 1.6–2.6)

## 2020-07-02 MED ADMIN — elexacaftor-tezacaftor-ivacaft (TRIKAFTA) tablet 2 tablet ***PATIENT SUPPLIED***: 2 | ORAL | @ 14:00:00 | Stop: 2020-07-04

## 2020-07-02 MED ADMIN — rivaroxaban (XARELTO) tablet 20 mg: 20 mg | ORAL | @ 22:00:00 | Stop: 2020-07-04

## 2020-07-02 MED ADMIN — insulin lispro (HumaLOG) injection 0-12 Units: 0-12 [IU] | SUBCUTANEOUS | @ 03:00:00 | Stop: 2020-07-04

## 2020-07-02 MED ADMIN — atorvastatin (LIPITOR) tablet 20 mg: 20 mg | ORAL | @ 03:00:00 | Stop: 2020-07-04

## 2020-07-02 MED ADMIN — lipase-protease-amylase (CREON) 36,000-114,000- 180,000 unit capsule ***PATIENT SUPPLIED***: 8 | ORAL | @ 14:00:00 | Stop: 2020-07-04

## 2020-07-02 MED ADMIN — sodium chloride 3 % nebulizer solution 4 mL: 4 mL | RESPIRATORY_TRACT | @ 03:00:00 | Stop: 2020-07-04

## 2020-07-02 MED ADMIN — albuterol 2.5 mg /3 mL (0.083 %) nebulizer solution 2.5 mg: 2.5 mg | RESPIRATORY_TRACT | @ 03:00:00 | Stop: 2020-07-04

## 2020-07-02 MED ADMIN — cetirizine (ZyrTEC) tablet 10 mg: 10 mg | ORAL | @ 14:00:00 | Stop: 2020-07-04

## 2020-07-02 MED ADMIN — famotidine (PEPCID) tablet 20 mg: 20 mg | ORAL | @ 14:00:00 | Stop: 2020-07-04

## 2020-07-02 MED ADMIN — amitriptyline (ELAVIL) tablet 100 mg: 100 mg | ORAL | @ 03:00:00 | Stop: 2020-07-04

## 2020-07-02 MED ADMIN — piperacillin-tazobactam (ZOSYN) IVPB (premix) 4.5 g: 4.5 g | INTRAVENOUS | @ 08:00:00 | Stop: 2020-07-04

## 2020-07-02 MED ADMIN — phytonadione (vitamin K1) (MEPHYTON) tablet 5 mg: 5 mg | ORAL | @ 14:00:00 | Stop: 2020-07-04

## 2020-07-02 MED ADMIN — acetaminophen (TYLENOL) tablet 1,000 mg: 1000 mg | ORAL | @ 18:00:00 | Stop: 2020-07-04

## 2020-07-02 MED ADMIN — acetaminophen (TYLENOL) tablet 1,000 mg: 1000 mg | ORAL | @ 11:00:00 | Stop: 2020-07-04

## 2020-07-02 MED ADMIN — lisinopriL (PRINIVIL,ZESTRIL) tablet 10 mg: 10 mg | ORAL | @ 14:00:00 | Stop: 2020-07-04

## 2020-07-02 MED ADMIN — FLUoxetine (PROzac) capsule 60 mg: 60 mg | ORAL | @ 14:00:00 | Stop: 2020-07-04

## 2020-07-02 MED ADMIN — insulin lispro (HumaLOG) injection 30 Units: 30 [IU] | SUBCUTANEOUS | @ 14:00:00 | Stop: 2020-07-04

## 2020-07-02 MED ADMIN — insulin lispro (HumaLOG) injection 30 Units: 30 [IU] | SUBCUTANEOUS | @ 22:00:00 | Stop: 2020-07-04

## 2020-07-02 MED ADMIN — sodium chloride 3 % nebulizer solution 4 mL: 4 mL | RESPIRATORY_TRACT | @ 23:00:00 | Stop: 2020-07-04

## 2020-07-02 MED ADMIN — piperacillin-tazobactam (ZOSYN) IVPB (premix) 4.5 g: 4.5 g | INTRAVENOUS | @ 19:00:00 | Stop: 2020-07-04

## 2020-07-02 MED ADMIN — oxyCODONE (ROXICODONE) immediate release tablet 5 mg: 5 mg | ORAL | @ 11:00:00 | Stop: 2020-07-04

## 2020-07-02 MED ADMIN — gabapentin (NEURONTIN) capsule 600 mg: 600 mg | ORAL | @ 03:00:00 | Stop: 2020-07-04

## 2020-07-02 MED ADMIN — fluticasone furoate-vilanteroL (BREO ELLIPTA) 200-25 mcg/dose inhaler 1 puff: 1 | RESPIRATORY_TRACT | @ 15:00:00 | Stop: 2020-07-04

## 2020-07-02 MED ADMIN — pediatric multivitamin-vit D3 1,500 unit-vit K 800 mcg (MVW COMPLETE FORMULATION) capsule: 2 | ORAL | @ 14:00:00 | Stop: 2020-07-04

## 2020-07-02 MED ADMIN — tobramycin (NEBCIN) 260 mg in sodium chloride (NS) 0.9 % 100 mL IVPB: 260 mg | INTRAVENOUS | @ 22:00:00 | Stop: 2020-07-04

## 2020-07-02 MED ADMIN — pediatric multivitamin-vit D3 1,500 unit-vit K 800 mcg (MVW COMPLETE FORMULATION) capsule: 2 | ORAL | @ 03:00:00 | Stop: 2020-07-04

## 2020-07-02 MED ADMIN — insulin lispro (HumaLOG) injection 30 Units: 30 [IU] | SUBCUTANEOUS | @ 18:00:00 | Stop: 2020-07-04

## 2020-07-02 MED ADMIN — lipase-protease-amylase (CREON) 36,000-114,000- 180,000 unit capsule ***PATIENT SUPPLIED***: 8 | ORAL | @ 18:00:00 | Stop: 2020-07-04

## 2020-07-02 MED ADMIN — dornase alfa (PULMOZYME) 1 mg/mL solution 2.5 mg: 2.5 mg | RESPIRATORY_TRACT | @ 15:00:00 | Stop: 2020-07-04

## 2020-07-02 MED ADMIN — albuterol 2.5 mg /3 mL (0.083 %) nebulizer solution 2.5 mg: 2.5 mg | RESPIRATORY_TRACT | @ 15:00:00 | Stop: 2020-07-04

## 2020-07-02 MED ADMIN — piperacillin-tazobactam (ZOSYN) IVPB (premix) 4.5 g: 4.5 g | INTRAVENOUS | @ 14:00:00 | Stop: 2020-07-04

## 2020-07-02 MED ADMIN — pramipexole (MIRAPEX) tablet 0.25 mg: .25 mg | ORAL | @ 03:00:00 | Stop: 2020-07-04

## 2020-07-02 MED ADMIN — albuterol 2.5 mg /3 mL (0.083 %) nebulizer solution 2.5 mg: 2.5 mg | RESPIRATORY_TRACT | @ 19:00:00 | Stop: 2020-07-04

## 2020-07-02 MED ADMIN — lamoTRIgine (LaMICtal) tablet 200 mg: 200 mg | ORAL | @ 14:00:00 | Stop: 2020-07-04

## 2020-07-02 MED ADMIN — sodium chloride 3 % nebulizer solution 4 mL: 4 mL | RESPIRATORY_TRACT | @ 19:00:00 | Stop: 2020-07-04

## 2020-07-02 MED ADMIN — traZODone (DESYREL) tablet 100 mg: 100 mg | ORAL | @ 03:00:00 | Stop: 2020-07-04

## 2020-07-02 MED ADMIN — pantoprazole (PROTONIX) EC tablet 20 mg: 20 mg | ORAL | @ 14:00:00 | Stop: 2020-07-04

## 2020-07-02 MED ADMIN — sodium chloride 3 % nebulizer solution 4 mL: 4 mL | RESPIRATORY_TRACT | @ 15:00:00 | Stop: 2020-07-04

## 2020-07-02 MED ADMIN — gabapentin (NEURONTIN) capsule 600 mg: 600 mg | ORAL | @ 14:00:00 | Stop: 2020-07-04

## 2020-07-02 MED ADMIN — gabapentin (NEURONTIN) capsule 600 mg: 600 mg | ORAL | @ 19:00:00 | Stop: 2020-07-04

## 2020-07-02 MED ADMIN — acetaminophen (TYLENOL) tablet 1,000 mg: 1000 mg | ORAL | @ 03:00:00 | Stop: 2020-07-04

## 2020-07-02 MED ADMIN — fluticasone propionate (FLONASE) 50 mcg/actuation nasal spray 1 spray: 1 | NASAL | @ 14:00:00 | Stop: 2020-07-04

## 2020-07-02 MED ADMIN — melatonin tablet 10.5 mg: 10 mg | ORAL | @ 03:00:00 | Stop: 2020-07-04

## 2020-07-02 MED ADMIN — montelukast (SINGULAIR) tablet 10 mg: 10 mg | ORAL | @ 03:00:00 | Stop: 2020-07-04

## 2020-07-02 MED ADMIN — albuterol 2.5 mg /3 mL (0.083 %) nebulizer solution 2.5 mg: 2.5 mg | RESPIRATORY_TRACT | @ 23:00:00 | Stop: 2020-07-04

## 2020-07-02 MED ADMIN — piperacillin-tazobactam (ZOSYN) IVPB (premix) 4.5 g: 4.5 g | INTRAVENOUS | @ 03:00:00 | Stop: 2020-07-04

## 2020-07-02 MED ADMIN — tobramycin (NEBCIN) 260 mg in sodium chloride (NS) 0.9 % 100 mL IVPB: 260 mg | INTRAVENOUS | @ 11:00:00 | Stop: 2020-07-04

## 2020-07-02 MED ADMIN — lipase-protease-amylase (CREON) 36,000-114,000- 180,000 unit capsule ***PATIENT SUPPLIED***: 8 | ORAL | @ 22:00:00 | Stop: 2020-07-04

## 2020-07-02 MED ADMIN — insulin glargine (LANTUS) injection 50 Units: 50 [IU] | SUBCUTANEOUS | @ 03:00:00 | Stop: 2020-07-04

## 2020-07-02 MED ADMIN — elexacaftor-tezacaftor-ivacaft (TRIKAFTA) tablet 1 tablet ***PATIENT SUPPLIED***: 1 | ORAL | @ 03:00:00 | Stop: 2020-07-04

## 2020-07-02 NOTE — Unmapped (Signed)
Pt has been free from falls throughout the shift.  No c/o N/V/D/SOB. Tylenol for mild HA with results. VSS    Problem: Adult Inpatient Plan of Care  Goal: Plan of Care Review  Outcome: Progressing  Goal: Patient-Specific Goal (Individualized)  Outcome: Progressing  Goal: Absence of Hospital-Acquired Illness or Injury  Outcome: Progressing  Goal: Optimal Comfort and Wellbeing  Outcome: Progressing  Goal: Readiness for Transition of Care  Outcome: Progressing  Goal: Rounds/Family Conference  Outcome: Progressing     Problem: Infection  Goal: Absence of Infection Signs and Symptoms  Outcome: Progressing     Problem: Impaired Wound Healing  Goal: Optimal Wound Healing  Outcome: Progressing     Problem: Fall Injury Risk  Goal: Absence of Fall and Fall-Related Injury  Outcome: Progressing     Problem: Adult Inpatient Plan of Care  Goal: Plan of Care Review  Outcome: Progressing  Goal: Patient-Specific Goal (Individualized)  Outcome: Progressing  Goal: Absence of Hospital-Acquired Illness or Injury  Outcome: Progressing  Goal: Optimal Comfort and Wellbeing  Outcome: Progressing  Goal: Readiness for Transition of Care  Outcome: Progressing  Goal: Rounds/Family Conference  Outcome: Progressing     Problem: Infection  Goal: Absence of Infection Signs and Symptoms  Outcome: Progressing     Problem: Impaired Wound Healing  Goal: Optimal Wound Healing  Outcome: Progressing     Problem: Fall Injury Risk  Goal: Absence of Fall and Fall-Related Injury  Outcome: Progressing

## 2020-07-02 NOTE — Unmapped (Signed)
Pt A/O X 4, VSS. Pain managed with PRN OXY, see MAR. Sputum sample sent to lab. Pt tolerating diet and voiding spontaneously. Pt ambulated in the room. No new concerns at this time.    Problem: Adult Inpatient Plan of Care  Goal: Plan of Care Review  Outcome: Progressing  Goal: Patient-Specific Goal (Individualized)  Outcome: Progressing  Goal: Absence of Hospital-Acquired Illness or Injury  Outcome: Progressing  Intervention: Identify and Manage Fall Risk  Recent Flowsheet Documentation  Taken 07/01/2020 1600 by Johnn Hai, RN  Safety Interventions:   fall reduction program maintained   low bed  Taken 07/01/2020 1400 by Johnn Hai, RN  Safety Interventions:   low bed   fall reduction program maintained  Taken 07/01/2020 1200 by Johnn Hai, RN  Safety Interventions:   fall reduction program maintained   low bed  Taken 07/01/2020 1000 by Johnn Hai, RN  Safety Interventions:   fall reduction program maintained   low bed  Taken 07/01/2020 0800 by Johnn Hai, RN  Safety Interventions:   fall reduction program maintained   low bed   infection management  Intervention: Prevent and Manage VTE (Venous Thromboembolism) Risk  Recent Flowsheet Documentation  Taken 07/01/2020 1600 by Johnn Hai, RN  Activity Management:   activity adjusted per tolerance   activity encouraged  Taken 07/01/2020 1200 by Johnn Hai, RN  Activity Management:   activity adjusted per tolerance   activity encouraged  Taken 07/01/2020 1000 by Johnn Hai, RN  Activity Management:   activity adjusted per tolerance   activity encouraged   ambulated in room  Taken 07/01/2020 0800 by Johnn Hai, RN  Activity Management:   activity adjusted per tolerance   activity encouraged  Goal: Optimal Comfort and Wellbeing  Outcome: Progressing  Goal: Readiness for Transition of Care  Outcome: Progressing  Goal: Rounds/Family Conference  Outcome: Progressing     Problem: Infection  Goal: Absence of Infection Signs and Symptoms  Outcome: Progressing  Intervention: Prevent or Manage Infection  Recent Flowsheet Documentation  Taken 07/01/2020 1600 by Johnn Hai, RN  Isolation Precautions: contact precautions maintained  Taken 07/01/2020 1400 by Johnn Hai, RN  Isolation Precautions: contact precautions maintained  Taken 07/01/2020 1200 by Johnn Hai, RN  Isolation Precautions: contact precautions maintained  Taken 07/01/2020 0800 by Johnn Hai, RN  Isolation Precautions: contact precautions maintained     Problem: Impaired Wound Healing  Goal: Optimal Wound Healing  Outcome: Progressing  Intervention: Promote Wound Healing  Recent Flowsheet Documentation  Taken 07/01/2020 1600 by Johnn Hai, RN  Activity Management:   activity adjusted per tolerance   activity encouraged  Taken 07/01/2020 1200 by Johnn Hai, RN  Activity Management:   activity adjusted per tolerance   activity encouraged  Taken 07/01/2020 1000 by Johnn Hai, RN  Activity Management:   activity adjusted per tolerance   activity encouraged   ambulated in room  Taken 07/01/2020 0800 by Johnn Hai, RN  Activity Management:   activity adjusted per tolerance   activity encouraged     Problem: Fall Injury Risk  Goal: Absence of Fall and Fall-Related Injury  Outcome: Progressing  Intervention: Promote Injury-Free Environment  Recent Flowsheet Documentation  Taken 07/01/2020 1600 by Johnn Hai, RN  Safety Interventions:   fall reduction program maintained   low bed  Taken 07/01/2020 1400 by Johnn Hai, RN  Safety Interventions:   low bed   fall reduction program maintained  Taken 07/01/2020 1200 by Johnn Hai, RN  Safety Interventions:   fall  reduction program maintained   low bed  Taken 07/01/2020 1000 by Johnn Hai, RN  Safety Interventions:   fall reduction program maintained   low bed  Taken 07/01/2020 0800 by Johnn Hai, RN  Safety Interventions:   fall reduction program maintained   low bed   infection management

## 2020-07-02 NOTE — Unmapped (Signed)
No acute issues overnight   Contact precautions maintained   Tolerated Po   Picc line intact not removed     Problem: Adult Inpatient Plan of Care  Goal: Plan of Care Review  Outcome: Progressing  Goal: Patient-Specific Goal (Individualized)  Outcome: Progressing  Goal: Absence of Hospital-Acquired Illness or Injury  Outcome: Progressing  Intervention: Identify and Manage Fall Risk  Recent Flowsheet Documentation  Taken 07/01/2020 2000 by Claudie Fisherman, RN  Safety Interventions:   low bed   fall reduction program maintained   isolation precautions  Goal: Optimal Comfort and Wellbeing  Outcome: Progressing  Goal: Readiness for Transition of Care  Outcome: Progressing  Goal: Rounds/Family Conference  Outcome: Progressing     Problem: Infection  Goal: Absence of Infection Signs and Symptoms  Outcome: Progressing  Intervention: Prevent or Manage Infection  Recent Flowsheet Documentation  Taken 07/01/2020 2000 by Claudie Fisherman, RN  Isolation Precautions: contact precautions maintained     Problem: Impaired Wound Healing  Goal: Optimal Wound Healing  Outcome: Progressing     Problem: Fall Injury Risk  Goal: Absence of Fall and Fall-Related Injury  Outcome: Progressing  Intervention: Promote Injury-Free Environment  Recent Flowsheet Documentation  Taken 07/01/2020 2000 by Claudie Fisherman, RN  Safety Interventions:   low bed   fall reduction program maintained   isolation precautions
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
## 2020-07-02 NOTE — Unmapped (Signed)
Hospital Medicine Daily Progress Note    Assessment/Plan:    Principal Problem:    Cystic fibrosis with pulmonary exacerbation (CMS-HCC)  Active Problems:    Essential hypertension    Depressive disorder    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)    Pancreatic insufficiency due to cystic fibrosis (CMS-HCC)    History of Mycobacterium abscessus infection    Chronic deep vein thrombosis (DVT) of lower extremity (CMS-HCC)    Obesity (BMI 30-39.9)    Restless leg syndrome  Resolved Problems:    * No resolved hospital problems. *   Malnutrition Evaluation as performed by RD, LDN: Patient does not meet AND/ASPEN criteria for malnutrition at this time (06/29/20 1131)             Christian Young is a 34 y.o. male that presented to [facility] with Cystic fibrosis with pulmonary exacerbation (CMS-HCC).    Acute Exacerbation of Bronchiectasis, CF: COVID/flu/RSV negative. CXR with increased bibasilar opacities compared to prior imaging. Symptoms are starting to improve slightly, overall better compared to at admission. PFTs completed 11/18  - Antibiotics: Zosyn and IV tobramycyin (11/16 - )  - Airway clearance: HTS+albuterol, daily Pulmozyme, chest vest  - Continue home Trikafta  - Continue home cetirizine, montelukast  - Home Symbicort substituted with formulary Breo Ellipta  - Holding tobi nebs while on IV antibiotics  - F/U repeat CT sputum culture, AFB  - Plan to discharge Monday with home infusion to complete remainder of antibiotic course as long as he continues to improve.    CF Related Pancreatic insufficiency:  - Continue pancreatic enzymes (home formulary)  - Continue PO vitamin K daily  ??  CF Related Diabetes: BG control improving as he clinically improves from his exacerbation. BGs at goal thus far today.  - Continue home lantus 50 units at bedtime  - Continue home lispro 30 units TID AC  - Lispro sliding scale ACHS    Anxiety/Depression: Continue fluoxetine, lamotrigine, amitriptyline    Hypertension: Continue lisinopril    History of DVT: Continue home Xarelto    RLS: Continue home pramipexole, gabapentin    FEN/GI:  - High protein/high calorie diet  - No IVF  - Replete lytes PRN    PPx: Home Xarelto  ___________________________________________________________________    Subjective:  Feeling better today, chest pain isn't as bad now. Trying to space out how often he asks for the oxycodone. No hemoptysis. No nausea/vomiting. Appetite good.     Labs/Studies:  Labs and Studies from the last 24hrs per EMR and Reviewed    Objective:  Temp:  [36.1 ??C (96.9 ??F)-36.6 ??C (97.8 ??F)] 36.2 ??C (97.2 ??F)  Heart Rate:  [75-94] 89  Resp:  [16-18] 16  BP: (100-109)/(58-66) 100/66  SpO2:  [93 %-99 %] 99 %    Gen: Lying flat in bed watching TV. Awake and alert. NAD. Non-toxic.  HEENT: EOMI, sclera anicteric. MMM.  Chest: L chest port accessed, clean overlying dressing.   Resp: Normal WOB. Equal air movement bilaterally. CTAB  CV: RRR, normal s1s2, no m/g/r.   Abd: Soft, NTND, normoactive BS.  Ext: WWP. No edema.

## 2020-07-03 LAB — BASIC METABOLIC PANEL
ANION GAP: 6 mmol/L (ref 5–14)
BLOOD UREA NITROGEN: 11 mg/dL (ref 9–23)
BUN / CREAT RATIO: 15
CALCIUM: 9.1 mg/dL (ref 8.7–10.4)
CHLORIDE: 100 mmol/L (ref 98–107)
CO2: 30.1 mmol/L (ref 20.0–31.0)
CREATININE: 0.74 mg/dL
EGFR CKD-EPI AA MALE: >90 mL/min/{1.73_m2} (ref >=60)
EGFR CKD-EPI NON-AA MALE: >90 mL/min/{1.73_m2} (ref >=60)
GLUCOSE RANDOM: 178 mg/dL (ref 70–179)
POTASSIUM: 4.1 mmol/L (ref 3.4–4.5)
SODIUM: 136 mmol/L (ref 135–145)

## 2020-07-03 LAB — CBC
HEMATOCRIT: 34.8 % — ABNORMAL LOW (ref 38.0–50.0)
HEMOGLOBIN: 11.7 g/dL — ABNORMAL LOW (ref 13.5–17.5)
MEAN CORPUSCULAR HEMOGLOBIN CONC: 33.5 g/dL (ref 30.0–36.0)
MEAN CORPUSCULAR HEMOGLOBIN: 27.7 pg (ref 26.0–34.0)
MEAN CORPUSCULAR VOLUME: 82.6 fL (ref 81.0–95.0)
MEAN PLATELET VOLUME: 7.7 fL (ref 7.0–10.0)
PLATELET COUNT: 266 10*9/L (ref 150–450)
RED BLOOD CELL COUNT: 4.21 10*12/L — ABNORMAL LOW (ref 4.32–5.72)
RED CELL DISTRIBUTION WIDTH: 15.1 % — ABNORMAL HIGH (ref 12.0–15.0)
WBC ADJUSTED: 4.9 10*9/L (ref 3.5–10.5)

## 2020-07-03 LAB — MAGNESIUM: MAGNESIUM: 1.8 mg/dL (ref 1.6–2.6)

## 2020-07-03 MED ADMIN — insulin glargine (LANTUS) injection 50 Units: 50 [IU] | SUBCUTANEOUS | @ 02:00:00 | Stop: 2020-07-04

## 2020-07-03 MED ADMIN — fluticasone furoate-vilanteroL (BREO ELLIPTA) 200-25 mcg/dose inhaler 1 puff: 1 | RESPIRATORY_TRACT | @ 15:00:00 | Stop: 2020-07-04

## 2020-07-03 MED ADMIN — rivaroxaban (XARELTO) tablet 20 mg: 20 mg | ORAL | Stop: 2020-07-04

## 2020-07-03 MED ADMIN — piperacillin-tazobactam (ZOSYN) IVPB (premix) 4.5 g: 4.5 g | INTRAVENOUS | @ 20:00:00 | Stop: 2020-07-04

## 2020-07-03 MED ADMIN — montelukast (SINGULAIR) tablet 10 mg: 10 mg | ORAL | @ 02:00:00 | Stop: 2020-07-04

## 2020-07-03 MED ADMIN — sodium chloride 3 % nebulizer solution 4 mL: 4 mL | RESPIRATORY_TRACT | @ 03:00:00 | Stop: 2020-07-04

## 2020-07-03 MED ADMIN — sodium chloride 3 % nebulizer solution 4 mL: 4 mL | RESPIRATORY_TRACT | @ 14:00:00 | Stop: 2020-07-04

## 2020-07-03 MED ADMIN — traZODone (DESYREL) tablet 100 mg: 100 mg | ORAL | @ 02:00:00 | Stop: 2020-07-04

## 2020-07-03 MED ADMIN — oxyCODONE (ROXICODONE) immediate release tablet 5 mg: 5 mg | ORAL | @ 02:00:00 | Stop: 2020-07-04

## 2020-07-03 MED ADMIN — albuterol 2.5 mg /3 mL (0.083 %) nebulizer solution 2.5 mg: 2.5 mg | RESPIRATORY_TRACT | @ 03:00:00 | Stop: 2020-07-04

## 2020-07-03 MED ADMIN — piperacillin-tazobactam (ZOSYN) IVPB (premix) 4.5 g: 4.5 g | INTRAVENOUS | @ 14:00:00 | Stop: 2020-07-04

## 2020-07-03 MED ADMIN — lipase-protease-amylase (CREON) 36,000-114,000- 180,000 unit capsule ***PATIENT SUPPLIED***: 8 | ORAL | Stop: 2020-07-04

## 2020-07-03 MED ADMIN — atorvastatin (LIPITOR) tablet 20 mg: 20 mg | ORAL | @ 02:00:00 | Stop: 2020-07-04

## 2020-07-03 MED ADMIN — elexacaftor-tezacaftor-ivacaft (TRIKAFTA) tablet 1 tablet ***PATIENT SUPPLIED***: 1 | ORAL | @ 02:00:00 | Stop: 2020-07-04

## 2020-07-03 MED ADMIN — amitriptyline (ELAVIL) tablet 100 mg: 100 mg | ORAL | @ 02:00:00 | Stop: 2020-07-04

## 2020-07-03 MED ADMIN — gabapentin (NEURONTIN) capsule 600 mg: 600 mg | ORAL | @ 18:00:00 | Stop: 2020-07-04

## 2020-07-03 MED ADMIN — lamoTRIgine (LaMICtal) tablet 200 mg: 200 mg | ORAL | @ 14:00:00 | Stop: 2020-07-04

## 2020-07-03 MED ADMIN — albuterol 2.5 mg /3 mL (0.083 %) nebulizer solution 2.5 mg: 2.5 mg | RESPIRATORY_TRACT | @ 15:00:00 | Stop: 2020-07-04

## 2020-07-03 MED ADMIN — acetaminophen (TYLENOL) tablet 1,000 mg: 1000 mg | ORAL | @ 02:00:00 | Stop: 2020-07-04

## 2020-07-03 MED ADMIN — fluticasone propionate (FLONASE) 50 mcg/actuation nasal spray 1 spray: 1 | NASAL | @ 14:00:00 | Stop: 2020-07-04

## 2020-07-03 MED ADMIN — lipase-protease-amylase (CREON) 36,000-114,000- 180,000 unit capsule ***PATIENT SUPPLIED***: 8 | ORAL | @ 17:00:00 | Stop: 2020-07-04

## 2020-07-03 MED ADMIN — gabapentin (NEURONTIN) capsule 600 mg: 600 mg | ORAL | @ 14:00:00 | Stop: 2020-07-04

## 2020-07-03 MED ADMIN — pediatric multivitamin-vit D3 1,500 unit-vit K 800 mcg (MVW COMPLETE FORMULATION) capsule: 2 | ORAL | @ 14:00:00 | Stop: 2020-07-04

## 2020-07-03 MED ADMIN — insulin lispro (HumaLOG) injection 30 Units: 30 [IU] | SUBCUTANEOUS | @ 14:00:00 | Stop: 2020-07-04

## 2020-07-03 MED ADMIN — pramipexole (MIRAPEX) tablet 0.25 mg: .25 mg | ORAL | @ 02:00:00 | Stop: 2020-07-04

## 2020-07-03 MED ADMIN — insulin lispro (HumaLOG) injection 0-12 Units: 0-12 [IU] | SUBCUTANEOUS | @ 18:00:00 | Stop: 2020-07-04

## 2020-07-03 MED ADMIN — piperacillin-tazobactam (ZOSYN) IVPB (premix) 4.5 g: 4.5 g | INTRAVENOUS | @ 02:00:00 | Stop: 2020-07-04

## 2020-07-03 MED ADMIN — sodium chloride 3 % nebulizer solution 4 mL: 4 mL | RESPIRATORY_TRACT | @ 20:00:00 | Stop: 2020-07-04

## 2020-07-03 MED ADMIN — pediatric multivitamin-vit D3 1,500 unit-vit K 800 mcg (MVW COMPLETE FORMULATION) capsule: 2 | ORAL | @ 02:00:00 | Stop: 2020-07-04

## 2020-07-03 MED ADMIN — lisinopriL (PRINIVIL,ZESTRIL) tablet 10 mg: 10 mg | ORAL | @ 14:00:00 | Stop: 2020-07-04

## 2020-07-03 MED ADMIN — phytonadione (vitamin K1) (MEPHYTON) tablet 5 mg: 5 mg | ORAL | @ 14:00:00 | Stop: 2020-07-04

## 2020-07-03 MED ADMIN — insulin lispro (HumaLOG) injection 30 Units: 30 [IU] | SUBCUTANEOUS | @ 18:00:00 | Stop: 2020-07-04

## 2020-07-03 MED ADMIN — piperacillin-tazobactam (ZOSYN) IVPB (premix) 4.5 g: 4.5 g | INTRAVENOUS | @ 07:00:00 | Stop: 2020-07-04

## 2020-07-03 MED ADMIN — tobramycin (NEBCIN) 260 mg in sodium chloride (NS) 0.9 % 100 mL IVPB: 260 mg | INTRAVENOUS | @ 11:00:00 | Stop: 2020-07-04

## 2020-07-03 MED ADMIN — insulin lispro (HumaLOG) injection 30 Units: 30 [IU] | SUBCUTANEOUS | Stop: 2020-07-04

## 2020-07-03 MED ADMIN — lipase-protease-amylase (CREON) 36,000-114,000- 180,000 unit capsule ***PATIENT SUPPLIED***: 8 | ORAL | @ 14:00:00 | Stop: 2020-07-04

## 2020-07-03 MED ADMIN — albuterol 2.5 mg /3 mL (0.083 %) nebulizer solution 2.5 mg: 2.5 mg | RESPIRATORY_TRACT | @ 23:00:00 | Stop: 2020-07-04

## 2020-07-03 MED ADMIN — cetirizine (ZyrTEC) tablet 10 mg: 10 mg | ORAL | @ 14:00:00 | Stop: 2020-07-04

## 2020-07-03 MED ADMIN — elexacaftor-tezacaftor-ivacaft (TRIKAFTA) tablet 2 tablet ***PATIENT SUPPLIED***: 2 | ORAL | @ 14:00:00 | Stop: 2020-07-04

## 2020-07-03 MED ADMIN — acetaminophen (TYLENOL) tablet 1,000 mg: 1000 mg | ORAL | @ 11:00:00 | Stop: 2020-07-04

## 2020-07-03 MED ADMIN — pantoprazole (PROTONIX) EC tablet 20 mg: 20 mg | ORAL | @ 14:00:00 | Stop: 2020-07-04

## 2020-07-03 MED ADMIN — MORPhine 4 mg/mL injection 4 mg: 4 mg | INTRAVENOUS | @ 18:00:00 | Stop: 2020-07-03

## 2020-07-03 MED ADMIN — albuterol 2.5 mg /3 mL (0.083 %) nebulizer solution 2.5 mg: 2.5 mg | RESPIRATORY_TRACT | @ 20:00:00 | Stop: 2020-07-04

## 2020-07-03 MED ADMIN — dornase alfa (PULMOZYME) 1 mg/mL solution 2.5 mg: 2.5 mg | RESPIRATORY_TRACT | @ 15:00:00 | Stop: 2020-07-04

## 2020-07-03 MED ADMIN — gabapentin (NEURONTIN) capsule 600 mg: 600 mg | ORAL | @ 02:00:00 | Stop: 2020-07-04

## 2020-07-03 MED ADMIN — melatonin tablet 10.5 mg: 10 mg | ORAL | @ 02:00:00 | Stop: 2020-07-04

## 2020-07-03 MED ADMIN — insulin lispro (HumaLOG) injection 0-12 Units: 0-12 [IU] | SUBCUTANEOUS | @ 02:00:00 | Stop: 2020-07-04

## 2020-07-03 MED ADMIN — sodium chloride 3 % nebulizer solution 4 mL: 4 mL | RESPIRATORY_TRACT | @ 23:00:00 | Stop: 2020-07-04

## 2020-07-03 MED ADMIN — acetaminophen (TYLENOL) tablet 1,000 mg: 1000 mg | ORAL | @ 18:00:00 | Stop: 2020-07-04

## 2020-07-03 MED ADMIN — famotidine (PEPCID) tablet 20 mg: 20 mg | ORAL | @ 14:00:00 | Stop: 2020-07-04

## 2020-07-03 MED ADMIN — FLUoxetine (PROzac) capsule 60 mg: 60 mg | ORAL | @ 14:00:00 | Stop: 2020-07-04

## 2020-07-03 NOTE — Unmapped (Signed)
Pt has his own chest vest.
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
## 2020-07-03 NOTE — Unmapped (Addendum)
IV abx infusing. Pt has no reports of pain. Ambulates independently. Blood glucose monitored. Safety measures in place.Will continue to monitor.    Problem: Adult Inpatient Plan of Care  Goal: Plan of Care Review  Outcome: Progressing  Goal: Patient-Specific Goal (Individualized)  Outcome: Progressing  Goal: Absence of Hospital-Acquired Illness or Injury  Outcome: Progressing  Intervention: Identify and Manage Fall Risk  Recent Flowsheet Documentation  Taken 07/03/2020 0600 by Gwenlyn Saran, RN  Safety Interventions:  ??? fall reduction program maintained  ??? low bed  Taken 07/03/2020 0400 by Gwenlyn Saran, RN  Safety Interventions:  ??? low bed  ??? fall reduction program maintained  Taken 07/03/2020 0200 by Gwenlyn Saran, RN  Safety Interventions:  ??? fall reduction program maintained  ??? low bed  Taken 07/03/2020 0000 by Gwenlyn Saran, RN  Safety Interventions:  ??? fall reduction program maintained  ??? low bed  Taken 07/02/2020 2200 by Gwenlyn Saran, RN  Safety Interventions:  ??? fall reduction program maintained  ??? low bed  Taken 07/02/2020 2000 by Gwenlyn Saran, RN  Safety Interventions:  ??? fall reduction program maintained  ??? low bed  Intervention: Prevent and Manage VTE (Venous Thromboembolism) Risk  Recent Flowsheet Documentation  Taken 07/02/2020 2000 by Gwenlyn Saran, RN  Activity Management: up ad lib  Goal: Optimal Comfort and Wellbeing  Outcome: Progressing  Goal: Readiness for Transition of Care  Outcome: Progressing  Goal: Rounds/Family Conference  Outcome: Progressing     Problem: Infection  Goal: Absence of Infection Signs and Symptoms  Outcome: Progressing     Problem: Impaired Wound Healing  Goal: Optimal Wound Healing  Outcome: Progressing  Intervention: Promote Wound Healing  Recent Flowsheet Documentation  Taken 07/02/2020 2000 by Gwenlyn Saran, RN  Activity Management: up ad lib     Problem: Fall Injury Risk  Goal: Absence of Fall and Fall-Related Injury  Outcome: Progressing  Intervention: Promote Injury-Free Environment  Recent Flowsheet Documentation  Taken 07/03/2020 0600 by Gwenlyn Saran, RN  Safety Interventions:  ??? fall reduction program maintained  ??? low bed  Taken 07/03/2020 0400 by Gwenlyn Saran, RN  Safety Interventions:  ??? low bed  ??? fall reduction program maintained  Taken 07/03/2020 0200 by Gwenlyn Saran, RN  Safety Interventions:  ??? fall reduction program maintained  ??? low bed  Taken 07/03/2020 0000 by Gwenlyn Saran, RN  Safety Interventions:  ??? fall reduction program maintained  ??? low bed  Taken 07/02/2020 2200 by Gwenlyn Saran, RN  Safety Interventions:  ??? fall reduction program maintained  ??? low bed  Taken 07/02/2020 2000 by Gwenlyn Saran, RN  Safety Interventions:  ??? fall reduction program maintained  ??? low bed

## 2020-07-03 NOTE — Unmapped (Signed)
Hospital Medicine Daily Progress Note    Assessment/Plan:    Principal Problem:    Cystic fibrosis with pulmonary exacerbation (CMS-HCC)  Active Problems:    Essential hypertension    Depressive disorder    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)    Pancreatic insufficiency due to cystic fibrosis (CMS-HCC)    History of Mycobacterium abscessus infection    Chronic deep vein thrombosis (DVT) of lower extremity (CMS-HCC)    Obesity (BMI 30-39.9)    Restless leg syndrome  Resolved Problems:    * No resolved hospital problems. *   Malnutrition Evaluation as performed by RD, LDN: Patient does not meet AND/ASPEN criteria for malnutrition at this time (06/29/20 1131)             Christian Young is a 34 y.o. male that presented to [facility] with Cystic fibrosis with pulmonary exacerbation (CMS-HCC).    Acute Exacerbation of Bronchiectasis, CF: COVID/flu/RSV negative. CXR with increased bibasilar opacities compared to prior imaging. PFTs completed 11/18. Having some more pain, but overall making progress.   - Antibiotics: Zosyn and IV tobramycyin (11/16 - )  - Airway clearance: HTS+albuterol, daily Pulmozyme, chest vest  - Continue home Trikafta  - Continue home cetirizine, montelukast  - Home Symbicort substituted with formulary Breo Ellipta  - Holding tobi nebs while on IV antibiotics  - F/U repeat CT sputum culture, AFB  - Continue oxycodone PRN chest pain - self tapering  - Still tentatively planning to discharge Monday with home infusion to complete remainder of antibiotic course    CF Related Pancreatic insufficiency:  - Continue pancreatic enzymes (home formulary)  - Continue PO vitamin K daily  ??  CF Related Diabetes: BG control improving as he clinically improves from his exacerbation. BGs at goal thus far today.  - Continue home lantus 50 units at bedtime  - Continue home lispro 30 units TID AC  - Lispro sliding scale ACHS    Anxiety/Depression: Continue fluoxetine, lamotrigine, amitriptyline    Hypertension: Continue lisinopril    History of DVT: Continue home Xarelto    RLS: Continue home pramipexole, gabapentin    FEN/GI:  - High protein/high calorie diet  - No IVF  - Replete lytes PRN    PPx: Home Xarelto    Dispo: Tentative plan to discharge home tomorrow, pending clinical course.   ___________________________________________________________________    Subjective:  Having more chest pain today, breaking through oxycodone. May be because he is coughing more with his airway clearance. No hemoptysis. Otherwise doing ok. No fever or chills. Appetite good. No nausea/vomiting.     Labs/Studies:  Labs and Studies from the last 24hrs per EMR and Reviewed    Objective:  Temp:  [36.3 ??C (97.3 ??F)-36.8 ??C (98.2 ??F)] 36.3 ??C (97.3 ??F)  Heart Rate:  [79-89] 80  Resp:  [16-18] 18  BP: (99-124)/(55-80) 124/80  SpO2:  [97 %-100 %] 97 %    Gen: Lying flat in bed sleeping. Awake and alert. NAD. Non-toxic.  HEENT: EOMI, sclera anicteric.   Chest: L chest port accessed, clean overlying dressing.   Resp: Normal WOB. Equal air movement bilaterally. CTAB  CV: RRR, normal s1s2, no m/g/r.   Abd: Soft, NTND, normoactive BS.  Ext: WWP. No edema.

## 2020-07-04 LAB — HEPATIC FUNCTION PANEL
ALBUMIN: 3.4 g/dL (ref 3.4–5.0)
ALKALINE PHOSPHATASE: 61 U/L (ref 46–116)
ALT (SGPT): 33 U/L (ref 10–49)
AST (SGOT): 25 U/L (ref <=34)
BILIRUBIN DIRECT: 0.2 mg/dL (ref 0.00–0.30)
BILIRUBIN TOTAL: 0.5 mg/dL (ref 0.3–1.2)
PROTEIN TOTAL: 6.6 g/dL (ref 5.7–8.2)

## 2020-07-04 LAB — CBC
HEMATOCRIT: 35.7 % — ABNORMAL LOW (ref 38.0–50.0)
HEMOGLOBIN: 11.9 g/dL — ABNORMAL LOW (ref 13.5–17.5)
MEAN CORPUSCULAR HEMOGLOBIN CONC: 33.2 g/dL (ref 30.0–36.0)
MEAN CORPUSCULAR HEMOGLOBIN: 27.2 pg (ref 26.0–34.0)
MEAN CORPUSCULAR VOLUME: 81.8 fL (ref 81.0–95.0)
MEAN PLATELET VOLUME: 7.8 fL (ref 7.0–10.0)
PLATELET COUNT: 281 10*9/L (ref 150–450)
RED BLOOD CELL COUNT: 4.37 10*12/L (ref 4.32–5.72)
RED CELL DISTRIBUTION WIDTH: 14.9 % (ref 12.0–15.0)
WBC ADJUSTED: 5.1 10*9/L (ref 3.5–10.5)

## 2020-07-04 LAB — BASIC METABOLIC PANEL
ANION GAP: 6 mmol/L (ref 5–14)
BLOOD UREA NITROGEN: 13 mg/dL (ref 9–23)
BUN / CREAT RATIO: 14
CALCIUM: 9.1 mg/dL (ref 8.7–10.4)
CHLORIDE: 100 mmol/L (ref 98–107)
CO2: 30.9 mmol/L (ref 20.0–31.0)
CREATININE: 0.91 mg/dL
EGFR CKD-EPI AA MALE: >90 mL/min/{1.73_m2} (ref >=60)
EGFR CKD-EPI NON-AA MALE: >90 mL/min/{1.73_m2} (ref >=60)
GLUCOSE RANDOM: 100 mg/dL (ref 70–179)
POTASSIUM: 4 mmol/L (ref 3.4–4.5)
SODIUM: 137 mmol/L (ref 135–145)

## 2020-07-04 LAB — ADDON DIFFERENTIAL ONLY
BASOPHILS ABSOLUTE COUNT: 0.1 10*9/L (ref 0.0–0.1)
BASOPHILS RELATIVE PERCENT: 1.5 %
EOSINOPHILS ABSOLUTE COUNT: 0.3 10*9/L (ref 0.0–0.7)
EOSINOPHILS RELATIVE PERCENT: 5.2 %
LYMPHOCYTES ABSOLUTE COUNT: 1.2 10*9/L (ref 0.7–4.0)
LYMPHOCYTES RELATIVE PERCENT: 24.6 %
MONOCYTES ABSOLUTE COUNT: 0.5 10*9/L (ref 0.1–1.0)
MONOCYTES RELATIVE PERCENT: 10.7 %
NEUTROPHILS ABSOLUTE COUNT: 2.9 10*9/L (ref 1.7–7.7)
NEUTROPHILS RELATIVE PERCENT: 58 %

## 2020-07-04 LAB — TOBRAMYCIN LEVEL, PEAK: TOBRAMYCIN PEAK: 8.1 ug/mL (ref 4.0–10.0)

## 2020-07-04 LAB — TOBRAMYCIN LEVEL, RANDOM: TOBRAMYCIN RANDOM: 1.4 ug/mL

## 2020-07-04 MED ORDER — TOBRAMYCIN IVPB IN 100 ML (STANDARD DOSING) (OVF)
Freq: Two times a day (BID) | INTRAVENOUS | 0 refills | 7.00000 days | Status: CP
Start: 2020-07-04 — End: 2020-07-05

## 2020-07-04 MED ORDER — PIPERACILLIN-TAZOBACTAM 4.5 GRAM/100 ML DEXTROSE(ISO-OSM) IV PIGGYBACK
Freq: Four times a day (QID) | INTRAVENOUS | 0 refills | 7 days | Status: CP
Start: 2020-07-04 — End: 2020-07-11

## 2020-07-04 MED ORDER — PEDIATRIC MULTIVITAMIN NO.61-VIT D3 1,500 UNIT-VIT K 800 MCG CAPSULE
Freq: Two times a day (BID) | ORAL | 0 days
Start: 2020-07-04 — End: 2021-07-04

## 2020-07-04 MED ORDER — DORNASE ALFA 1 MG/ML SOLUTION FOR INHALATION
Freq: Two times a day (BID) | RESPIRATORY_TRACT | 3 refills | 90.00000 days
Start: 2020-07-04 — End: 2021-07-04

## 2020-07-04 MED ORDER — TRAZODONE 100 MG TABLET
ORAL_TABLET | Freq: Every evening | ORAL | 0 refills | 30 days
Start: 2020-07-04 — End: 2020-08-03

## 2020-07-04 MED ADMIN — oxyCODONE (ROXICODONE) immediate release tablet 5 mg: 5 mg | ORAL | @ 11:00:00 | Stop: 2020-07-04

## 2020-07-04 MED ADMIN — pediatric multivitamin-vit D3 1,500 unit-vit K 800 mcg (MVW COMPLETE FORMULATION) capsule: 2 | ORAL | @ 02:00:00 | Stop: 2020-07-04

## 2020-07-04 MED ADMIN — fluticasone propionate (FLONASE) 50 mcg/actuation nasal spray 1 spray: 1 | NASAL | @ 14:00:00 | Stop: 2020-07-04

## 2020-07-04 MED ADMIN — fluticasone furoate-vilanteroL (BREO ELLIPTA) 200-25 mcg/dose inhaler 1 puff: 1 | RESPIRATORY_TRACT | @ 14:00:00 | Stop: 2020-07-04

## 2020-07-04 MED ADMIN — sodium chloride 3 % nebulizer solution 4 mL: 4 mL | RESPIRATORY_TRACT | @ 02:00:00 | Stop: 2020-07-04

## 2020-07-04 MED ADMIN — acetaminophen (TYLENOL) tablet 1,000 mg: 1000 mg | ORAL | @ 20:00:00 | Stop: 2020-07-04

## 2020-07-04 MED ADMIN — famotidine (PEPCID) tablet 20 mg: 20 mg | ORAL | @ 14:00:00 | Stop: 2020-07-04

## 2020-07-04 MED ADMIN — albuterol 2.5 mg /3 mL (0.083 %) nebulizer solution 2.5 mg: 2.5 mg | RESPIRATORY_TRACT | @ 02:00:00 | Stop: 2020-07-04

## 2020-07-04 MED ADMIN — dornase alfa (PULMOZYME) 1 mg/mL solution 2.5 mg: 2.5 mg | RESPIRATORY_TRACT | @ 14:00:00 | Stop: 2020-07-04

## 2020-07-04 MED ADMIN — sodium chloride 3 % nebulizer solution 4 mL: 4 mL | RESPIRATORY_TRACT | @ 14:00:00 | Stop: 2020-07-04

## 2020-07-04 MED ADMIN — tobramycin (NEBCIN) 260 mg in sodium chloride (NS) 0.9 % 100 mL IVPB: 260 mg | INTRAVENOUS | @ 11:00:00 | Stop: 2020-07-04

## 2020-07-04 MED ADMIN — lamoTRIgine (LaMICtal) tablet 200 mg: 200 mg | ORAL | @ 14:00:00 | Stop: 2020-07-04

## 2020-07-04 MED ADMIN — insulin lispro (HumaLOG) injection 30 Units: 30 [IU] | SUBCUTANEOUS | @ 14:00:00 | Stop: 2020-07-04

## 2020-07-04 MED ADMIN — albuterol 2.5 mg /3 mL (0.083 %) nebulizer solution 2.5 mg: 2.5 mg | RESPIRATORY_TRACT | @ 14:00:00 | Stop: 2020-07-04

## 2020-07-04 MED ADMIN — piperacillin-tazobactam (ZOSYN) IVPB (premix) 4.5 g: 4.5 g | INTRAVENOUS | @ 20:00:00 | Stop: 2020-07-04

## 2020-07-04 MED ADMIN — pantoprazole (PROTONIX) EC tablet 20 mg: 20 mg | ORAL | @ 14:00:00 | Stop: 2020-07-04

## 2020-07-04 MED ADMIN — tobramycin (NEBCIN) 260 mg in sodium chloride (NS) 0.9 % 100 mL IVPB: 260 mg | INTRAVENOUS | @ 01:00:00 | Stop: 2020-07-04

## 2020-07-04 MED ADMIN — lipase-protease-amylase (CREON) 36,000-114,000- 180,000 unit capsule ***PATIENT SUPPLIED***: 8 | ORAL | @ 14:00:00 | Stop: 2020-07-04

## 2020-07-04 MED ADMIN — oxyCODONE (ROXICODONE) immediate release tablet 5 mg: 5 mg | ORAL | @ 20:00:00 | Stop: 2020-07-04

## 2020-07-04 MED ADMIN — pediatric multivitamin-vit D3 1,500 unit-vit K 800 mcg (MVW COMPLETE FORMULATION) capsule: 2 | ORAL | @ 14:00:00 | Stop: 2020-07-04

## 2020-07-04 MED ADMIN — acetaminophen (TYLENOL) tablet 1,000 mg: 1000 mg | ORAL | @ 03:00:00 | Stop: 2020-07-04

## 2020-07-04 MED ADMIN — elexacaftor-tezacaftor-ivacaft (TRIKAFTA) tablet 1 tablet ***PATIENT SUPPLIED***: 1 | ORAL | @ 02:00:00 | Stop: 2020-07-04

## 2020-07-04 MED ADMIN — gabapentin (NEURONTIN) capsule 600 mg: 600 mg | ORAL | @ 02:00:00 | Stop: 2020-07-04

## 2020-07-04 MED ADMIN — acetaminophen (TYLENOL) tablet 1,000 mg: 1000 mg | ORAL | @ 11:00:00 | Stop: 2020-07-04

## 2020-07-04 MED ADMIN — gabapentin (NEURONTIN) capsule 600 mg: 600 mg | ORAL | @ 20:00:00 | Stop: 2020-07-04

## 2020-07-04 MED ADMIN — gabapentin (NEURONTIN) capsule 600 mg: 600 mg | ORAL | @ 14:00:00 | Stop: 2020-07-04

## 2020-07-04 MED ADMIN — insulin glargine (LANTUS) injection 50 Units: 50 [IU] | SUBCUTANEOUS | @ 03:00:00 | Stop: 2020-07-04

## 2020-07-04 MED ADMIN — pramipexole (MIRAPEX) tablet 0.25 mg: .25 mg | ORAL | @ 02:00:00 | Stop: 2020-07-04

## 2020-07-04 MED ADMIN — elexacaftor-tezacaftor-ivacaft (TRIKAFTA) tablet 2 tablet ***PATIENT SUPPLIED***: 2 | ORAL | @ 14:00:00 | Stop: 2020-07-04

## 2020-07-04 MED ADMIN — lisinopriL (PRINIVIL,ZESTRIL) tablet 10 mg: 10 mg | ORAL | @ 14:00:00 | Stop: 2020-07-04

## 2020-07-04 MED ADMIN — piperacillin-tazobactam (ZOSYN) IVPB (premix) 4.5 g: 4.5 g | INTRAVENOUS | @ 08:00:00 | Stop: 2020-07-04

## 2020-07-04 MED ADMIN — phytonadione (vitamin K1) (MEPHYTON) tablet 5 mg: 5 mg | ORAL | @ 14:00:00 | Stop: 2020-07-04

## 2020-07-04 MED ADMIN — cetirizine (ZyrTEC) tablet 10 mg: 10 mg | ORAL | @ 14:00:00 | Stop: 2020-07-04

## 2020-07-04 MED ADMIN — lipase-protease-amylase (CREON) 36,000-114,000- 180,000 unit capsule ***PATIENT SUPPLIED***: 8 | ORAL | @ 17:00:00 | Stop: 2020-07-04

## 2020-07-04 MED ADMIN — oxyCODONE (ROXICODONE) immediate release tablet 5 mg: 5 mg | ORAL | @ 02:00:00 | Stop: 2020-07-04

## 2020-07-04 MED ADMIN — atorvastatin (LIPITOR) tablet 20 mg: 20 mg | ORAL | @ 02:00:00 | Stop: 2020-07-04

## 2020-07-04 MED ADMIN — montelukast (SINGULAIR) tablet 10 mg: 10 mg | ORAL | @ 02:00:00 | Stop: 2020-07-04

## 2020-07-04 MED ADMIN — traZODone (DESYREL) tablet 100 mg: 100 mg | ORAL | @ 02:00:00 | Stop: 2020-07-04

## 2020-07-04 MED ADMIN — melatonin tablet 10.5 mg: 10 mg | ORAL | @ 02:00:00 | Stop: 2020-07-04

## 2020-07-04 MED ADMIN — amitriptyline (ELAVIL) tablet 100 mg: 100 mg | ORAL | @ 02:00:00 | Stop: 2020-07-04

## 2020-07-04 MED ADMIN — MORPhine 4 mg/mL injection 4 mg: 4 mg | INTRAVENOUS | @ 03:00:00 | Stop: 2020-07-03

## 2020-07-04 MED ADMIN — FLUoxetine (PROzac) capsule 60 mg: 60 mg | ORAL | @ 14:00:00 | Stop: 2020-07-04

## 2020-07-04 MED ADMIN — piperacillin-tazobactam (ZOSYN) IVPB (premix) 4.5 g: 4.5 g | INTRAVENOUS | @ 14:00:00 | Stop: 2020-07-04

## 2020-07-04 MED ADMIN — piperacillin-tazobactam (ZOSYN) IVPB (premix) 4.5 g: 4.5 g | INTRAVENOUS | @ 02:00:00 | Stop: 2020-07-04
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
## 2020-07-04 NOTE — Unmapped (Signed)
[facility]  Adult Cystic Fibrosis Clinic        SW REFERRAL SOURCE/REASON: Christian Young is a 34 y.o. male followed by [facility] for his CF. Focus of conversation today is conducting assessment and addressing psychosocial needs.  Christian Young is currently hospitalized at [facility] for a CF exacerbation and assessment was completed via phone. Christian Young passed the phone to his wife for a few minutes when someone entered his hospital room to administer medications.                    CURRENT LIVING ARRANGEMENTS/SUPPORTS:    Christian Young and his wife currently live in [place], [place]. Over the summer they were able to move out of his parents home and into their own place. Christian Young reports a good support system of family and friends.       SAFETY:   No safety concerns at this time.          TRANSPORTATION:   Christian Young and his wife have been sharing one car. His wife states this has been a challenge but they have made it work.    EDUCATION/EMPLOYMENT/FINANCIAL STATUS:       Christian Young currently works at a company that [employer]	[place] [employer]. He states his job can be stressful at times as it gets busy but he reports his manager is supportive.  His wife works as an Museum/gallery exhibitions officer but is currently in school to become a Charity fundraiser. Finances are tight, especially with the added cost of his wife's schooling but they deny concerns at this time.    HOBBIES/INTERESTS:    Christian Young enjoys video games.    INSURANCE:   Christian Young has [employer] through his wife and denies any concerns with coverage.    FOOD INSECURITY:   Christian Young has endorsed food insecurity in the past and has participated in the [facility] program. His wife states they are generally okay but endorsed recent food insecurity due to unexpected school costs. This Clinical research associate will plan to met with Christian Young and his wife at his upcoming clinic appointment to complete a [employer] assistance referral.    ADHERENCE:   No concerns.        MENTAL HEALTH/COPING/SUBSTANCE USE  Christian Young completed the MH and SA screenings in clinic on 01/04/2020. At that time, he scored a 3 on the PHQ-9, a one on the GAD-7, and a 3 on the AUDIT. This SW inquired about his current mental health and Christian Young stated it was okay but not the greatest. He stated his job can be stressful and busy and he described some of his colleagues as children. Explored ways he copes with work stress and he stated he grins and bears it and has a couple of good work friends that provide support. He continues to take Prozac, Lamictal,and Trazodone prescribed by a provider in [place]. Her first name is Christian Young but he cannot recall her last name. He has seen her for a couple of years and has an appointment with her in the near future. He does not have a current therapist but stated he was in the market for a therapist. He had a therapist in [place] a few years ago that he connected with but he cannot remember her name. Provided Christian Young with information on how to use the [employer] website, and will plan to discuss his mental health options at his next clinic visit.       PLAN:   Will plan to met with Christian Young at his next  clinic appointment in December and will complete a referral for grocery assistance through [employer]. Will also discuss his current mental health concerns and options for treatment.    Christian Silveria Waneta Martins, LCSW

## 2020-07-04 NOTE — Unmapped (Addendum)
Discharge Services and Resources:     Infusion Agency: [facility] Phone: [phone]  Infusion Start of Care Date:  07/04/2020    Nursing Agency: Anastasia Fiedler [facility]  Phone: [phone]   Fax: [phone]  Home Health Start of Care Date: 07/06/2020

## 2020-07-04 NOTE — Unmapped (Signed)
Pt A/O X 4, VSS. Pain managed with PRN OXY, see MAR. Pt tolerating diet and voiding spontaneously. Pt ambulated in the room. No new concerns at this time.      Problem: Adult Inpatient Plan of Care  Goal: Patient-Specific Goal (Individualized)  Outcome: Progressing     Problem: Adult Inpatient Plan of Care  Goal: Optimal Comfort and Wellbeing  Outcome: Progressing     Problem: Adult Inpatient Plan of Care  Goal: Readiness for Transition of Care  Outcome: Progressing

## 2020-07-04 NOTE — Unmapped (Signed)
IV abx infusing. Chest pain managed with oxycodone and IV morphine. Ambulates independently. Blood glucose monitored. Safety measures in place. Will continue to monitor.    Problem: Adult Inpatient Plan of Care  Goal: Plan of Care Review  Outcome: Progressing  Goal: Patient-Specific Goal (Individualized)  Outcome: Progressing  Goal: Absence of Hospital-Acquired Illness or Injury  Outcome: Progressing  Intervention: Identify and Manage Fall Risk  Recent Flowsheet Documentation  Taken 07/04/2020 0600 by Gwenlyn Saran, RN  Safety Interventions:   fall reduction program maintained   low bed  Taken 07/04/2020 0400 by Gwenlyn Saran, RN  Safety Interventions:   fall reduction program maintained   low bed  Taken 07/04/2020 0200 by Gwenlyn Saran, RN  Safety Interventions:   fall reduction program maintained   low bed  Taken 07/04/2020 0000 by Gwenlyn Saran, RN  Safety Interventions:   fall reduction program maintained   low bed  Taken 07/03/2020 2200 by Gwenlyn Saran, RN  Safety Interventions:   fall reduction program maintained   low bed  Taken 07/03/2020 2000 by Gwenlyn Saran, RN  Safety Interventions:   fall reduction program maintained   low bed  Intervention: Prevent and Manage VTE (Venous Thromboembolism) Risk  Recent Flowsheet Documentation  Taken 07/03/2020 2000 by Gwenlyn Saran, RN  Activity Management: up ad lib  Goal: Optimal Comfort and Wellbeing  Outcome: Progressing  Goal: Readiness for Transition of Care  Outcome: Progressing  Goal: Rounds/Family Conference  Outcome: Progressing     Problem: Infection  Goal: Absence of Infection Signs and Symptoms  Outcome: Progressing  Intervention: Prevent or Manage Infection  Recent Flowsheet Documentation  Taken 07/03/2020 2000 by Gwenlyn Saran, RN  Isolation Precautions: contact precautions maintained     Problem: Impaired Wound Healing  Goal: Optimal Wound Healing  Outcome: Progressing  Intervention: Promote Wound Healing  Recent Flowsheet Documentation  Taken 07/03/2020 2000 by Gwenlyn Saran, RN  Activity Management: up ad lib     Problem: Fall Injury Risk  Goal: Absence of Fall and Fall-Related Injury  Outcome: Progressing  Intervention: Promote Injury-Free Environment  Recent Flowsheet Documentation  Taken 07/04/2020 0600 by Gwenlyn Saran, RN  Safety Interventions:   fall reduction program maintained   low bed  Taken 07/04/2020 0400 by Gwenlyn Saran, RN  Safety Interventions:   fall reduction program maintained   low bed  Taken 07/04/2020 0200 by Gwenlyn Saran, RN  Safety Interventions:   fall reduction program maintained   low bed  Taken 07/04/2020 0000 by Gwenlyn Saran, RN  Safety Interventions:   fall reduction program maintained   low bed  Taken 07/03/2020 2200 by Gwenlyn Saran, RN  Safety Interventions:   fall reduction program maintained   low bed  Taken 07/03/2020 2000 by Gwenlyn Saran, RN  Safety Interventions:   fall reduction program maintained   low bed

## 2020-07-05 MED ORDER — TOBRAMYCIN IVPB IN 100 ML (STANDARD DOSING) (OVF)
Freq: Two times a day (BID) | INTRAVENOUS | 0 refills | 6.00000 days | Status: CP
Start: 2020-07-05 — End: 2020-07-11

## 2020-07-05 NOTE — Unmapped (Signed)
Pharmacist Discharge Note  Patient Name: Christian Young  Reason for admission: CF exacerbation  Reason for writing this note: high risk medication    Highlighted medication changes with rationale (if applicable):  Disciplines requested: Nursing and Home IV Antibiotics    Physician to follow patient's care (the person listed here will be responsible for signing ongoing orders): Maia Plan, MD    Inpatient Start Date of Antibiotics (Day 1): 06/28/20  Start of Care Date (discharge date): 07/04/20  Estimate completion of therapy: 07/11/20  Home health/infusion please reach out to nurse contact (below) 2-3 days prior to end of therapy for appropriate orders    IV ANTIBIOTIC ORDERS:  Tobramycin 240mg  IV every 12 hours (Conventional dosing, infuse over 30 minutes)  Piperacillin-tazobactam (Zosyn) 4.5g IV every 6 hours   This is a tobramycin dose decrease from 260mg  Q12H to 240mg  Q12H given increasing serum creatinine and peak levels. Informed Dr. Darlina Rumpf, CPP to follow-up outpatient.    **DO NOT DRAW DRUG LEVELS FROM PORT**    LAB MONITORING ORDERS:  Start labs on 07/06/20 (given the Thanksgiving holiday)  (Tobramycin Conventional Dose and Ceftazidime/Cefepime/Aztreonam/Ceftaroline/Piperacillin-tazobactam): ONCE weekly: CBC with diff, tobramycin trough level on Mondays (1 hour prior to START of infusion time); TWICE weekly: BMP, Mg. Please collect once weekly labs on Mondays and twice weekly labs on Mondays and Thursdays.    Collect on MONDAYS of each week:  CBC + diff  Magnesium  Basic Metabolic Panel  Tobramycin Trough Level (collect prior to START of infusion)    Collect on THURSDAYS of each week (only for patients on aminoglycosides)  Magnesium  Basic Metabolic Panel  Tobramycin Trough Level (collect prior to START of infusion)    TARGET DRUG LEVELS:  -Tobramycin (Conventional Dosing): Target end of infusion peak: 10-14 mcg/mL, trough < 1 mcg/mL    --------------------------------------------------------------------------------------  *PLEASE FAX RESULTS TO: [phone]    *PHONE CONTACT*:  Adult CF Patients (Last name A to K): Nurse Coordinator-Nicole Bingham, RN [phone]  For critical lab results or urgent matters outside of regular business hours (Mon through Fri 8:00AM-4:30PM, please call the hospital operator at [phone] and ask to page the pulmonary fellow on-call.    **Please warm hand-off to outpatient team nurse coordinators per contact list above**          Medication access:  - Not applicable    Outpatient follow-up:  [ ]  Monitor renal function  [ ]  Education on dose decrease    Mat Carne, PharmD Candidate    Nino Parsley, PharmD    Future Appointments   Date Time Provider Department Center   07/25/2020  3:40 PM Harrie Foreman, MD [facility]   09/05/2020  1:15 PM PFT 3 [facility]   09/05/2020  1:30 PM Marlyne Beards, MD [facility]

## 2020-07-05 NOTE — Unmapped (Addendum)
07/05/20 0945  Per documentation, it was advised that Berle's home IV antibiotic tobramycin dose be adjusted down from 260mg  to 240mg  every 12 hours.     Discussed with Carnella Guadalajara, CPP. Requesting labs on:    Wednesday:  BMP + Magnesium  Friday: BMP, Magnesium + Tobramycin Peak (1 hour after infusion is completed)    New Rx written for Tobramycin 240mg  every 12 hours. Document for new lab orders written and faxed to [facility] at [phone].    Called Shaquel to discuss labs, dose change, and possible visit on Monday, November 29th.  Left message. MyChart message sent.       07/05/20 1515   Spoke with [name] earlier today. He will be dosing his tobramycin at 0730 and 1930 and Zoysn at 0700, 1300, 1900, 0100.   He will be seen by [facility] on Wednesday at 0900. Reiterated to Athony that since we didn't need levels on Wednesday, it could be drawn off his PAC.  Friday, he would dose his tobramycin from 0730-0800 and be ready for a peak drawn at 0900, should the home health nurse be able to return at 0900 on Friday.     Discussed this with [place] at [place]. He received the faxed prescription with dose change and lab requests. He will ensure that Anastasia Fiedler is aware.     Doy also agreeable to the Monday appointment with Durenda Hurt. Scheduled per his requested time.       [name], RN  CF Nurse Coordinator   [phone]

## 2020-07-05 NOTE — Unmapped (Signed)
[facility]  [address]   [place], [place] [address]  [phone]  ________________________________________________    Patient: Christian Young  DOB: 08/21/1985  Phone: [phone] (home)   Address:   [address]  [place], [place] [address]  [place] [address]      Diagnosis Codes:  Receiving home intravenous antibiotics (Z79.2)  Cystic fibrosis with pulmonary exacerbation (E84.0)    Date: 07/05/20  Expiration date: 6 months from above date  Priority: Routine    Lab orders:    Collect on Wednesday 11/24:  Magnesium  Basic Metabolic Panel    Collect on Friday 11/26  Magnesium  Basic Metabolic Panel  Tobramycin Peak Level (collect 1-1.5 hrs after END of infusion time)    Please fax results to: CF Patient: [phone]     For questions during business hours (8:30am-5pm):  [phone]     For urgent calls after hours/holidays:  Please call [phone] and ask to page the pulmonary fellow on-call.    [name], RN  CF Nurse Coordinator   [phone]

## 2020-07-05 NOTE — Unmapped (Signed)
Aminoglycoside Therapeutic Monitoring Pharmacy Note    Christian Young is a 34 y.o. male continuing tobramycin. Date of therapy initiation: 06/28/20    Indication: CF exacerbation    Prior Dosing Information: Current regimen 260mg  IV Q12H      Goals:  Therapeutic Drug Levels  ?? Trough level: tobramycin <1 mg/L  ?? Peak level: tobramycin 10-14 mg/L    Additional Clinical Monitoring/Outcomes  Renal function, volume status (intake and output)    Results:   ?? Trough level: 1.4 mg/L, drawn @ 1501 (Extrapolated trough: 0.58 mg/L)   ?? Peak level: 8.1 mg/L, drawn @ 0830 (Extrapolated Peak: 12.8 mg/L)    Wt Readings from Last 1 Encounters:   06/28/20 (!) 105.8 kg (233 lb 4.8 oz)     Lab Results   Component Value Date    CREATININE 0.91 07/04/2020       Pharmacokinetic Considerations and Significant Drug Interactions:  ??? Adult (calculated on 07/04/20): Vd = 19.8 L, ke = 0.269 hr-1  ??? Concurrent nephrotoxic meds: piperacillin-tazobactam    Assessment/Plan:  Recommendation(s)  ??? Change current regimen to 240mg  IV Q12H given patient's increase in serum creatinine and increasing peak levels.  ??? Estimated peak and trough on recommended regimen: peak = 11.8 mg/L, trough = 0.5 mg/L    Follow-up  ??? Level due: peak and trough levels around the fourth or fifth dose  ??? A pharmacist will continue to monitor and order levels as appropriate    Please page service pharmacist with questions/clarifications.    Mat Carne, PharmD Candidate

## 2020-07-05 NOTE — Unmapped (Signed)
Christian Young is a 34 year old male who was admitted for CF exacerbation. He had worsening symptoms of congestion, sinus pain, shortness of breath, and cough. He contacted the CF clinic who recommended admission for CF exacerbation. CXR at admission showed increased bibasilar opacities. He was started on Zosyn and tobramycin based on prior sensitivities. He was started on airway clearance, with holding Pulmozyme for one day as he had reported hemoptysis at admission. He also had chest pain which gradually improved with treating his CF exacerbation. His symptoms improved, and he will discharge with home infusion to complete the second week of his 14 day course of antibiotics.

## 2020-07-05 NOTE — Unmapped (Signed)
Physician Discharge Summary HBR  3 BT1 HBR  [address]  [place] [address]  Dept: [phone]  Loc: [phone]     Identifying Information:   Christian Young  [birth year]  [id number]    Primary Care Physician: [facility]   Code Status: Full Code    Admit Date: 06/28/2020    Discharge Date: 07/04/2020     Discharge To: Home    Discharge Service: HBR - HBC: Hospitalist Service #2     Discharge Attending Physician: Roby Lofts, MD     Discharge Diagnoses:  Principal Problem:    Cystic fibrosis with pulmonary exacerbation (CMS-HCC) POA: Yes  Active Problems:    Essential hypertension POA: Yes    Depressive disorder POA: Yes    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) POA: Yes    Pancreatic insufficiency due to cystic fibrosis (CMS-HCC) POA: Yes    History of Mycobacterium abscessus infection POA: Yes    Chronic deep vein thrombosis (DVT) of lower extremity (CMS-HCC) POA: Yes    Obesity (BMI 30-39.9) POA: Yes    Restless leg syndrome POA: Yes  Resolved Problems:    * No resolved hospital problems. *      Outpatient Provider Follow Up Issues:   [ ]  Pulmonary follow-up for CF exacerbation    Hospital Course:   Christian Young is a 34 year old male who was admitted for CF exacerbation. He had worsening symptoms of congestion, sinus pain, shortness of breath, and cough. He contacted the CF clinic who recommended admission for CF exacerbation. CXR at admission showed increased bibasilar opacities. He was started on Zosyn and tobramycin based on prior sensitivities. He was started on airway clearance, with holding Pulmozyme for one day as he had reported hemoptysis at admission. He also had chest pain which gradually improved with treating his CF exacerbation. His symptoms improved, and he will discharge with home infusion to complete the second week of his 14 day course of antibiotics.      Procedures:  PFTs 11/18 ______________________________________________________________________  Discharge Medications:     Your Medication List      START taking these medications    piperacillin-tazobactam 4.5 gram/100 mL Pgbk  Commonly known as: ZOSYN  Infuse 100 mL (4.5 g total) into a venous catheter every six (6) hours for 7 days.     tobramycin 260 mg, OVERFILL 10 mL in sodium chloride 0.9 % 100 mL IVPB  Infuse 260 mg into a venous catheter every twelve (12) hours for 7 days.        CHANGE how you take these medications    insulin ASPART 100 unit/mL (3 mL) injection pen  Commonly known as: NovoLOG FLEXPEN  Use up to 150 units/day, divided 3 TIMES DAILY BEFORE MEALS  What changed: See the new instructions.     insulin glargine 100 unit/mL (3 mL) injection pen  Commonly known as: BASAGLAR, LANTUS  Inject 0.7 mL (70 Units total) under the skin daily.  What changed:   ?? how much to take  ?? when to take this     MVW COMPLETE FORMUL PROBIOTIC 40 billion cell -15 mg Cpdr  Generic drug: Lacto-Bif-Sac-Bacil-Strep-bact  Take 1 capsule by mouth daily.  What changed: when to take this        CONTINUE taking these medications    albuterol 90 mcg/actuation inhaler  Commonly known as: PROVENTIL HFA;VENTOLIN HFA  Inhale 2 puffs every six (6) hours as needed.  albuterol 2.5 mg/0.5 mL nebulizer solution  Inhale 0.5 mL (2.5 mg total) by nebulization Two (2) times a day.     amitriptyline 100 MG tablet  Commonly known as: ELAVIL  TAKE ONE TABLET BY MOUTH AT BEDTIME     atorvastatin 20 MG tablet  Commonly known as: LIPITOR  Take 20 mg by mouth nightly.     BETHKIS 300 mg/4 mL Nebu  Generic drug: tobramycin  Inhale 1 vial via nebulization every 12 hours for 28 days, alternating every other 28 days     blood-glucose meter kit  Dispense meter that is preferred by patient's insurance company     budesonide-formoteroL 160-4.5 mcg/actuation inhaler  Commonly known as: SYMBICORT  Inhale 2 puffs Two (2) times a day.     cetirizine 10 MG tablet  Commonly known as: ZyrTEC  TAKE ONE TABLET BY MOUTH AT LUNCH     CREON 36,000-114,000- 180,000 unit Cpdr  Generic drug: lipase-protease-amylase  TAKE 8 CAPSULES BY MOUTH 3 TIMES DAILY WITH MEALS AND TAKE 4 CAPSULES TWICE DAILY WITH SNACKS *MAY TAKE UP TO 1 EXTRA SNACK DAILY. MAX 36 PER DAY*     DEXCOM G6 RECEIVER Misc  Generic drug: blood-glucose meter,continuous  Use as directed     DEXCOM G6 SENSOR Devi  Generic drug: blood-glucose sensor  Use sq as directed every 14 days     DEXCOM G6 TRANSMITTER Devi  Generic drug: blood-glucose transmitter  Use as directed     dornase alfa 1 mg/mL nebulizer solution  Commonly known as: PULMOZYME  Inhale 2.5 mg Two (2) times a day.     EASY TOUCH LANCING DEVICE Misc  Generic drug: lancing device  Use as directed.     EASY TOUCH TWIST LANCETS 30 gauge Misc  Generic drug: lancets  Use as directed.     famotidine 20 MG tablet  Commonly known as: PEPCID  Take 1 tablet (20 mg total) by mouth daily.     FLUoxetine 60 mg Tab  Take 60 mg by mouth daily.     fluticasone propionate 50 mcg/actuation nasal spray  Commonly known as: FLONASE  USE 1 SPRAY IN EACH NOSTRIL EVERY DAY     gabapentin 600 mg Tb24  Take 600 mg by mouth daily.     glucose blood Strp  Generic drug: blood sugar diagnostic  by Other route Three (3) times a day with a meal. Rx sent to [facility] 01/04/20     lamoTRIgine 200 MG tablet  Commonly known as: LaMICtal  Take 200 mg by mouth daily.     LC PLUS Misc  Generic drug: nebulizers  Use as directed with inhaled medications     lisinopriL 10 MG tablet  Commonly known as: PRINIVIL,ZESTRIL  Take 1 tablet (10 mg total) by mouth daily.     melatonin 10 mg Tab  Take 10 mg by mouth nightly.     montelukast 10 mg tablet  Commonly known as: SINGULAIR  TAKE ONE TABLET BY MOUTH AT BEDTIME     mupirocin 2 % ointment  Commonly known as: BACTROBAN  Apply inside of both nostrils and to other affected areas twice daily.     MVW Complete (pediatric multivit 61-D3-vit K) 1,500-800 unit-mcg Cap  Commonly known as: MVW COMPLETE FORMULATION  Take 1 capsule by mouth Two (2) times a day.     omeprazole 20 MG capsule  Commonly known as: PriLOSEC  Take 20 mg by mouth daily.     pen needle, diabetic  32 gauge x 5/32 (4 mm) Ndle  Commonly known as: BD ULTRA-FINE NANO PEN NEEDLE  use up to 4 times daily     pramipexole 0.125 MG tablet  Commonly known as: MIRAPEX  Take 0.25 mg by mouth every evening.     sodium chloride 7% 7 % Nebu  Inhale 4 mL by nebulization Two (2) times a day. Increase to 4 times while taking antibiotics     traZODone 100 MG tablet  Commonly known as: DESYREL  Take 1 tablet (100 mg total) by mouth nightly.     TRIKAFTA 100-50-75 mg(d) /150 mg (n) tablet  Generic drug: elexacaftor-tezacaftor-ivacaft  Take 2 Tablets (orange) by mouth in the morning and 1 tablet (blue) in the evening with fatty food     XARELTO 20 mg tablet  Generic drug: rivaroxaban  Take 20 mg by mouth daily.            Allergies:  Cayston [aztreonam lysine], Cefepime, Other, Slo-bid 100, Banana, and Tobramycin  ______________________________________________________________________  Pending Test Results (if blank, then none):  Pending Labs     Order Current Status    AFB culture In process    CF Sputum/ CF Sinus Culture Preliminary result        Most Recent Labs:  All lab results last 24 hours -   Recent Results (from the past 24 hour(s))   Basic metabolic panel    Collection Time: 07/04/20  6:18 AM   Result Value Ref Range    Sodium 137 135 - 145 mmol/L    Potassium 4.0 3.4 - 4.5 mmol/L    Chloride 100 98 - 107 mmol/L    CO2 30.9 20.0 - 31.0 mmol/L    Anion Gap 6 5 - 14 mmol/L    BUN 13 9 - 23 mg/dL    Creatinine 1.61 0.96 - 1.10 mg/dL    BUN/Creatinine Ratio 14     EGFR CKD-EPI Non-African American, Male >90 >=60 mL/min/1.34m2    EGFR CKD-EPI African American, Male >90 >=60 mL/min/1.26m2    Glucose 100 70 - 179 mg/dL    Calcium 9.1 8.7 - 04.5 mg/dL   CBC    Collection Time: 07/04/20  6:18 AM   Result Value Ref Range    WBC 5.1 3.5 - 10.5 10*9/L    RBC 4.37 4.32 - 5.72 10*12/L    HGB 11.9 (L) 13.5 - 17.5 g/dL    HCT 40.9 (L) 81.1 - 50.0 %    MCV 81.8 81.0 - 95.0 fL    MCH 27.2 26.0 - 34.0 pg    MCHC 33.2 30.0 - 36.0 g/dL    RDW 91.4 78.2 - 95.6 %    MPV 7.8 7.0 - 10.0 fL    Platelet 281 150 - 450 10*9/L   Hepatic Function Panel    Collection Time: 07/04/20  6:18 AM   Result Value Ref Range    Albumin 3.4 3.4 - 5.0 g/dL    Total Protein 6.6 5.7 - 8.2 g/dL    Total Bilirubin 0.5 0.3 - 1.2 mg/dL    Bilirubin, Direct 2.13 0.00 - 0.30 mg/dL    AST 25 <=08 U/L    ALT 33 10 - 49 U/L    Alkaline Phosphatase 61 46 - 116 U/L   Addon Differential Only    Collection Time: 07/04/20  6:18 AM   Result Value Ref Range    Neutrophils % 58.0 %    Lymphocytes % 24.6 %    Monocytes %  10.7 %    Eosinophils % 5.2 %    Basophils % 1.5 %    Absolute Neutrophils 2.9 1.7 - 7.7 10*9/L    Absolute Lymphocytes 1.2 0.7 - 4.0 10*9/L    Absolute Monocytes 0.5 0.1 - 1.0 10*9/L    Absolute Eosinophils 0.3 0.0 - 0.7 10*9/L    Absolute Basophils 0.1 0.0 - 0.1 10*9/L   POCT Glucose    Collection Time: 07/04/20  8:07 AM   Result Value Ref Range    Glucose, POC 84 70 - 179 mg/dL   Tobramycin Level, Peak    Collection Time: 07/04/20  8:30 AM   Result Value Ref Range    Tobramycin Pk 8.1 4.0 - 10.0 ug/mL   POCT Glucose    Collection Time: 07/04/20 12:03 PM   Result Value Ref Range    Glucose, POC 61 (L) 70 - 179 mg/dL   Tobramycin Level, Random    Collection Time: 07/04/20  3:01 PM   Result Value Ref Range    Tobramycin Rm 1.4 Undefined ug/mL       Relevant Studies/Radiology (if blank, then none):  ECG 12 lead (Adult)    Result Date: 06/29/2020  NORMAL SINUS RHYTHM NORMAL ECG WHEN COMPARED WITH ECG OF 29-Sep-2019 14:17, NO SIGNIFICANT CHANGE WAS FOUND Confirmed by Mariane Baumgarten ([id number]) on 06/29/2020 9:13:42 AM    XR Chest Portable    Result Date: 06/28/2020  EXAM: XR CHEST PORTABLE DATE: 06/28/2020 4:26 PM ACCESSION: [id number] UN DICTATED: 06/28/2020 4:27 PM INTERPRETATION LOCATION: [facility] CLINICAL INDICATION: 34 years old Male with COUGH  COMPARISON: Chest radiograph dated 09/29/2019 TECHNIQUE: Portable Chest Radiograph. FINDINGS: Unchanged left chest wall Port-A-Cath Status post right upper lobe lobectomy. Bronchial wall thickening with patchy bibasilar opacities, increased from prior accounting for degree of lung inflation No pneumothorax. Small right pleural effusion versus pleural scarring Stable cardiomediastinal silhouette. Postsurgical changes from resection of the posterior right sixth rib.     Bronchial wall thickening and bibasilar opacities may represent infection    ______________________________________________________________________  Discharge Instructions:     Diet Instructions     Discharge diet (specify)      Discharge Nutrition Therapy: Regular          Follow Up instructions and Outpatient Referrals     Call MD for:  difficulty breathing, headache or visual disturbances      Call MD for:  persistent dizziness or light-headedness      Call MD for:  persistent nausea or vomiting      Call MD for:  severe uncontrolled pain      Call MD for:  temperature >38.5 Celsius      Discharge instructions      Referral to Home Infusion      Performing location?: External    Home Health Requested Disciplines: Nursing    Do you want ongoing co-management?: No    Care coordination required?: No     **Please contact your service pharmacist for assistance with discharge   home health infusion monitoring.          Appointments which have been scheduled for you    Jul 25, 2020  3:40 PM  (Arrive by 3:25 PM)  RETURN  GENERAL with Harrie Foreman, MD  [facility] DIABETES AND ENDOCRINOLOGY [place] [facility] REGION) [address]  [place] [address]  [phone]      Sep 05, 2020  1:15 PM  (Arrive by 1:00 PM)  RETURN PFT 15  with PFT 3  [facility] PULMONARY SPECIALTY FUNCT [place] [facility]) [address]  [place] [address]  [phone]      Sep 05, 2020  1:30 PM  (Arrive by 1:15 PM)  RETURN CYSTIC FIBROSIS with Marlyne Beards, MD  [facility] PULMONARY SPECIALTY CL [place] [facility] REGION) [address]  [place] [address]  [phone]           ______________________________________________________________________  Discharge Day Services:  BP (P) 115/70  - Pulse 90  - Temp (P) 35.9 ??C (96.7 ??F) (Temporal)  - Resp 18  - Ht 182.9 cm (6')  - Wt (!) 105.8 kg (233 lb 4.8 oz)  - SpO2 98%  - BMI 31.64 kg/m??   Pt seen on the day of discharge and determined appropriate for discharge.    Condition at Discharge: good    Length of Discharge: I spent greater than 30 mins in the discharge of this patient.

## 2020-07-05 NOTE — Unmapped (Signed)
No acute changes noted at this time. Patient complaining of chronic chest pain given PRN pain medications with some relief. Patient denies any needs and states he is ready for discharge.    Problem: Adult Inpatient Plan of Care  Goal: Plan of Care Review  Outcome: Progressing  Goal: Patient-Specific Goal (Individualized)  Outcome: Progressing  Goal: Absence of Hospital-Acquired Illness or Injury  Outcome: Progressing  Intervention: Identify and Manage Fall Risk  Recent Flowsheet Documentation  Taken 07/04/2020 0800 by Clotilde Dieter Yusif Gnau, RN  Safety Interventions:   low bed   nonskid shoes/slippers when out of bed  Goal: Optimal Comfort and Wellbeing  Outcome: Progressing  Goal: Readiness for Transition of Care  Outcome: Progressing  Goal: Rounds/Family Conference  Outcome: Progressing     Problem: Infection  Goal: Absence of Infection Signs and Symptoms  Outcome: Progressing  Intervention: Prevent or Manage Infection  Recent Flowsheet Documentation  Taken 07/04/2020 0800 by Clotilde Dieter Rolan Wrightsman, RN  Isolation Precautions: contact precautions maintained     Problem: Impaired Wound Healing  Goal: Optimal Wound Healing  Outcome: Progressing     Problem: Fall Injury Risk  Goal: Absence of Fall and Fall-Related Injury  Outcome: Progressing  Intervention: Promote Scientist, clinical (histocompatibility and immunogenetics) Documentation  Taken 07/04/2020 0800 by Clotilde Dieter Ethelene Closser, RN  Safety Interventions:   low bed   nonskid shoes/slippers when out of bed     Problem: Diabetes Comorbidity  Goal: Blood Glucose Level Within Targeted Range  Outcome: Progressing     Problem: Hypertension Comorbidity  Goal: Blood Pressure in Desired Range  Outcome: Progressing     Problem: Infection (Cystic Fibrosis)  Goal: Absence of Infection Signs and Symptoms  Outcome: Progressing  Intervention: Manage Infection and Prevent Transmission  Recent Flowsheet Documentation  Taken 07/04/2020 0800 by Clotilde Dieter Ario Mcdiarmid, RN  Isolation Precautions: contact precautions maintained     Problem: Respiratory Compromise (Cystic Fibrosis)  Goal: Effective Oxygenation and Ventilation  Outcome: Progressing

## 2020-07-11 ENCOUNTER — Ambulatory Visit: Admit: 2020-07-11 | Discharge: 2020-07-12 | Payer: PRIVATE HEALTH INSURANCE

## 2020-07-11 DIAGNOSIS — Z881 Allergy status to other antibiotic agents status: Principal | ICD-10-CM

## 2020-07-11 DIAGNOSIS — Z794 Long term (current) use of insulin: Principal | ICD-10-CM

## 2020-07-11 DIAGNOSIS — Z23 Encounter for immunization: Principal | ICD-10-CM

## 2020-07-11 DIAGNOSIS — J324 Chronic pansinusitis: Principal | ICD-10-CM

## 2020-07-11 DIAGNOSIS — F39 Unspecified mood [affective] disorder: Principal | ICD-10-CM

## 2020-07-11 DIAGNOSIS — Z7901 Long term (current) use of anticoagulants: Principal | ICD-10-CM

## 2020-07-11 DIAGNOSIS — I10 Essential (primary) hypertension: Principal | ICD-10-CM

## 2020-07-11 DIAGNOSIS — F419 Anxiety disorder, unspecified: Principal | ICD-10-CM

## 2020-07-11 DIAGNOSIS — Z902 Acquired absence of lung [part of]: Principal | ICD-10-CM

## 2020-07-11 DIAGNOSIS — Z792 Long term (current) use of antibiotics: Principal | ICD-10-CM

## 2020-07-11 DIAGNOSIS — R079 Chest pain, unspecified: Principal | ICD-10-CM

## 2020-07-11 DIAGNOSIS — Z79899 Other long term (current) drug therapy: Principal | ICD-10-CM

## 2020-07-11 DIAGNOSIS — E089 Diabetes mellitus due to underlying condition without complications: Principal | ICD-10-CM

## 2020-07-11 DIAGNOSIS — K8689 Other specified diseases of pancreas: Principal | ICD-10-CM

## 2020-07-11 LAB — BASIC METABOLIC PANEL
ANION GAP: 6 mmol/L (ref 5–14)
BLOOD UREA NITROGEN: 17 mg/dL (ref 9–23)
BUN / CREAT RATIO: 21
CALCIUM: 9.6 mg/dL (ref 8.7–10.4)
CHLORIDE: 102 mmol/L (ref 98–107)
CO2: 27.9 mmol/L (ref 20.0–31.0)
CREATININE: 0.82 mg/dL
EGFR CKD-EPI AA MALE: >90 mL/min/{1.73_m2} (ref >=60)
EGFR CKD-EPI NON-AA MALE: >90 mL/min/{1.73_m2} (ref >=60)
GLUCOSE RANDOM: 241 mg/dL — ABNORMAL HIGH (ref 70–179)
POTASSIUM: 3.9 mmol/L (ref 3.4–4.5)
SODIUM: 136 mmol/L (ref 135–145)

## 2020-07-11 LAB — TOBRAMYCIN LEVEL, RANDOM: TOBRAMYCIN RANDOM: 2.4 ug/mL

## 2020-07-11 LAB — MAGNESIUM: MAGNESIUM: 1.7 mg/dL (ref 1.6–2.6)

## 2020-07-11 NOTE — Unmapped (Signed)
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 They do not have labs from Wednesday, November 24th and were informed of the error in the lab collection on Friday, November 26th (vancomycin ran rather than Tobramycin). Was advised to follow-up with the lab directly. Spoke with [facility] lab. They informed this caller that they would need a new specimen and new lab order, that unfortunately, the sample is too old.     Christian Young is in clinic today. Will update covering provider, Christian Young and pharmacist, Christian Young.     Christian Flake [name], RN  CF Nurse Coordinator   [phone]

## 2020-07-11 NOTE — Unmapped (Addendum)
Goals and plans we discussed today:  1. Glad you're feeling better!  2. Continue your treatments twice a day  3. I'll send a referral to [facility] ENT   4. Come back in 2 months to see Dr. [name]    Thank you for allowing [name] to be a part of your care!     To reach your CF nurse coordinators:    Patients with the last name A-K: Joni Reining [phone]  Patients with the last name L-Z: Harriett Sine [phone]     For urgent issues after hours/weekends:  Hospital Operator: [phone]) [phone], ask for Pulmonary Fellow on-call     To make or change a clinic appointment:   [facility]: [phone]     --> When you should use MyChart:           - Order a prescription refill          - View test results          - Send a non-urgent message or update to the care team          - View after-visit summaries           - See or pay bills      --> When you should call (NOT use MyChart)           - Increase in cough          - Change in amount of mucus or mucus color           - Coughing up blood or blood-tinged mucus          - Chest pain          - Shortness of breath           - Lack of energy, feeling sick, or increase in tiredness     --> I don't have a MyChart. Why should I get one?           - It's encrypted, so your information is secure          - It's a quick, easy way to contact the care team, manage appointments, see test results, and more!      --> How do I sign-up for MyChart?            - Download the MyChart app from the [employer] or [employer] and sign-up in the app           - Sign-up online at [email or web address]

## 2020-07-11 NOTE — Unmapped (Signed)
Pulmonary Clinic - Follow-up Visit    ASSESSMENT     Christian Young is a 34 y.o. male with cystic fibrosis [phone] and [phone] insertion) who presents to clinic for hospital follow-up. He was recently admitted from 06/28/20 - 07/04/20 for a CF exacerbation and finishes 2 weeks of IV Tobramycin and IV Zosyn today. He feels like it all started with a sinus infection about a month prior. His FEV1 is stable at 68%.     Problem List Items Addressed This Visit        Respiratory    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)    Chronic pansinusitis    Relevant Orders    Ambulatory referral to ENT    Cystic fibrosis (CMS-HCC) - Primary    Relevant Orders    Basic Metabolic Panel    Magnesium Level    Tobramycin Level, Random       Digestive    Pancreatic insufficiency due to cystic fibrosis (CMS-HCC)       Other    Mood disorder (CMS-HCC)          PLAN     1) Plan to stop home IV Tobramycin and IV Zosyn today - repeat Tobra random level, BMP and Mag level in clinic.   2) Recent sputum culture with smooth and mucoid Pseudomonas   3) Continue Trikafta - recheck LFT's annually (annual labs were in 01/2020)   4) Continue home CF inhaled meds and airway clearance twice daily  5) Continue TOBI nebs BID every other month   6) Continue Creon and MVW vitamins   7) Continue insulin as prescribed - has [facility] follow up in 07/2020. Due for repeat A1c.   8) Vaccinated for Flu and COVID-19. Will give COVID booster today.   9) Patient seen by CF SW today - patient following locally for mental health.   10) Sent referral to [facility] ENT for chronic rhinosinusitis    The patient will return to clinic in 2 months as scheduled to see Dr. [name], or sooner if clinically indicated.    Durenda Hurt [place], PA-C  [facility]   [phone]    HISTORY:     Interval History:  Diagnosed at 33 with chronic infections, had consolidation and parapneumonic effusion,sent to [facility] for treatment since then.   [id number] and [id number] insertion. 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    Christian Young presents to clinic today for hospital follow up. He was admitted to [facility] from 11/16 - 07/04/20 for a CF exacerbation. He had been treated for a sinus infection about a month prior to that and feels like he never fully got back to baseline. He finishes 2 weeks of IV antibiotics today. He reports feeling tremendously better. His cough, sputum, and chest pain are decreased. He wasn't able to bring up a sputum sample during PFT's. His energy level is improved, and he is back to his usual self. He is doing his treatments twice daily at home and rotating Tobi nebs every other month. He is much more active in his current job.     His appetite is back to baseline. Overall, he has lost about 40 lbs since a year ago by controlling his portion size and being more active. He denies abdominal pain, nausea, vomiting, diarrhea, constipation or heartburn. He takes his enzymes regularly. He takes 2 MVW D5000 vitamins twice a day and MVW probiotic daily.     He and  his wife are living on their own in [place]. They are happy to have their own space away from his parents. Christian Young is working full-time at [employer]. His wife is working as an Museum/gallery exhibitions officer and is school to be a Radiation protection practitioner.    Cystic fibrosis issues:    Sinuses: Uses Flonase and takes Claritin and Singulair. No congestion, drainage or pressure currently, but thinks he's had 3-4 sinus infections this year.     Airway clearance: Does albuterol, and 7% hypertonic saline twice a day.  Reports compliance to it.  Does Pulmozyme as well twice a day.  Uses the vest occasionally.  Uses Tobi nebs every other month.     GI and nutrition: Denies any constipation or diarrhea.  Reports compliance to his Creon.  Has lost 30-40 lbs in the past year.     Hypertension: On lisinopril 10 mg daily. Was on Coreg before, which was started in 2018 as an inpatient for sinus tachycardia.     Diabetes: Followed and managed by endocrinology.  Reports compliance to his insulin. Since weight loss, has decreased insulin to Lantus 50 units daily and Novolog 30 units with meals. Now has a Therapist, art and it works much better for him than [employer].     Mental health issues: Much improved on Trikafta now that he is working full-time and not as sick. Following with local Psychiatrist, no recent med changes, and interested in finding a new therapist.      Past Medical History:   Diagnosis Date   ??? Anxiety    ??? Chronic pain disorder    ??? Cystic fibrosis (CMS-HCC)    ??? Depression    ??? Hypertension    ??? Nonproductive cough 04/05/2018     Past Surgical History:   Procedure Laterality Date   ??? IR INSERT PORT AGE GREATER THAN 5 YRS  03/27/2019    IR INSERT PORT AGE GREATER THAN 5 YRS 03/27/2019 Rush Barer, MD IMG VIR [facility]   ??? PR REMOVAL OF LUNG,LOBECTOMY Right 03/29/2017    Procedure: REMOVAL OF LUNG, OTHER THAN PNEUMONECTOMY; SINGLE LOBE (LOBECTOMY);  Surgeon: Cherie Dark, MD;  Location: MAIN [facility];  Service: Thoracic       Other History:  The social history and family history were personally reviewed and updated in the patient's electronic medical record.     Home Medications:  Current Outpatient Medications on File Prior to Visit   Medication Sig Dispense Refill   ??? albuterol (PROVENTIL HFA;VENTOLIN HFA) 90 mcg/actuation inhaler Inhale 2 puffs every six (6) hours as needed. 1 Inhaler 1   ??? albuterol 2.5 mg/0.5 mL nebulizer solution Inhale 0.5 mL (2.5 mg total) by nebulization Two (2) times a day. 180 each 3   ??? amitriptyline (ELAVIL) 100 MG tablet TAKE ONE TABLET BY MOUTH AT BEDTIME 30 tablet 1   ??? atorvastatin (LIPITOR) 20 MG tablet Take 20 mg by mouth nightly.     ??? BETHKIS 300 mg/4 mL Nebu Inhale 1 vial via nebulization every 12 hours for 28 days, alternating every other 28 days 224 mL 6   ??? blood sugar diagnostic (GLUCOSE BLOOD) Strp by Other route Three (3) times a day with a meal. Rx sent to [facility] 01/04/20 ??? blood-glucose meter kit Dispense meter that is preferred by patient's insurance company 1 each 0   ??? blood-glucose meter,continuous (DEXCOM G6 RECEIVER) Misc Use as directed 1 each 0   ??? budesonide-formoteroL (SYMBICORT) 160-4.5 mcg/actuation inhaler Inhale 2 puffs Two (2) times  a day. 30.6 g 3   ??? cetirizine (ZYRTEC) 10 MG tablet TAKE ONE TABLET BY MOUTH AT LUNCH 90 tablet 3   ??? CREON 36,000-114,000- 180,000 unit CpDR TAKE 8 CAPSULES BY MOUTH 3 TIMES DAILY WITH MEALS AND TAKE 4 CAPSULES TWICE DAILY WITH SNACKS *MAY TAKE UP TO 1 EXTRA SNACK DAILY. MAX 36 PER DAY* 1200 capsule 11   ??? DEXCOM G6 SENSOR Devi Use sq as directed every 14 days 6 each 11   ??? DEXCOM G6 TRANSMITTER Devi Use as directed 1 each 11   ??? dornase alfa (PULMOZYME) 1 mg/mL nebulizer solution Inhale 2.5 mg Two (2) times a day. 450 mL 3   ??? EASY TOUCH LANCING DEVICE Misc Use as directed.     ??? EASY TOUCH TWIST LANCETS 30 gauge Misc Use as directed.     ??? elexacaftor-tezacaftor-ivacaft (TRIKAFTA) 100-50-75 mg(d) /150 mg (n) tablet Take 2 Tablets (orange) by mouth in the morning and 1 tablet (blue) in the evening with fatty food 84 tablet 0   ??? famotidine (PEPCID) 20 MG tablet Take 1 tablet (20 mg total) by mouth daily. 30 tablet 5   ??? FLUoxetine 60 mg Tab Take 60 mg by mouth daily.      ??? fluticasone propionate (FLONASE) 50 mcg/actuation nasal spray USE 1 SPRAY IN EACH NOSTRIL EVERY DAY 16 g 11   ??? gabapentin 600 mg Tb24 Take 600 mg by mouth daily.     ??? insulin ASPART (NOVOLOG FLEXPEN) 100 unit/mL (3 mL) injection pen Use up to 150 units/day, divided 3 TIMES DAILY BEFORE MEALS (Patient taking differently: Inject 30 Units under the skin Three (3) times a day before meals. Use up to 150 units/day, divided 3 TIMES DAILY BEFORE MEALS) 45 mL 0   ??? insulin glargine (BASAGLAR, LANTUS) 100 unit/mL (3 mL) injection pen Inject 0.7 mL (70 Units total) under the skin daily. (Patient taking differently: Inject 50 Units under the skin nightly. ) 30 mL 0   ??? lamoTRIgine (LAMICTAL) 200 MG tablet Take 200 mg by mouth daily.     ??? lisinopriL (PRINIVIL,ZESTRIL) 10 MG tablet Take 1 tablet (10 mg total) by mouth daily. 30 tablet 0   ??? melatonin 10 mg Tab Take 10 mg by mouth nightly.     ??? montelukast (SINGULAIR) 10 mg tablet TAKE ONE TABLET BY MOUTH AT BEDTIME 90 tablet 3   ??? mupirocin (BACTROBAN) 2 % ointment Apply inside of both nostrils and to other affected areas twice daily. 22 g 0   ??? MVW COMPLETE FORMUL PROBIOTIC 40 billion cell -15 mg CpDR Take 1 capsule by mouth daily. (Patient taking differently: Take 1 capsule by mouth Two (2) times a day. ) 90 capsule 3   ??? MVW Complete, pediatric multivit 61-D3-vit K, (MVW COMPLETE FORMULATION) 1,500-800 unit-mcg cap Take 1 capsule by mouth Two (2) times a day.     ??? nebulizers Misc Use as directed with inhaled medications 4 each 3   ??? omeprazole (PRILOSEC) 20 MG capsule Take 20 mg by mouth daily.     ??? pen needle, diabetic (BD ULTRA-FINE NANO PEN NEEDLE) 32 gauge x 5/32 (4 mm) Ndle use up to 4 times daily 400 each 12   ??? piperacillin-tazobactam (ZOSYN) 4.5 gram/100 mL PgBk Infuse 100 mL (4.5 g total) into a venous catheter every six (6) hours for 7 days. 2800 mL 0   ??? pramipexole (MIRAPEX) 0.125 MG tablet Take 0.25 mg by mouth every evening.      ???  sodium chloride 7% 7 % Nebu Inhale 4 mL by nebulization Two (2) times a day. Increase to 4 times while taking antibiotics     ??? tobramycin 240 mg, OVERFILL 10 mL in sodium chloride 0.9 % 100 mL IVPB Infuse 240 mg into a venous catheter every twelve (12) hours for 6 days. 12 each 0   ??? traZODone (DESYREL) 100 MG tablet Take 1 tablet (100 mg total) by mouth nightly. 30 tablet 0   ??? XARELTO 20 mg tablet Take 20 mg by mouth daily.       No current facility-administered medications on file prior to visit.        Allergies:  Allergies as of 07/11/2020 - Reviewed 07/11/2020   Allergen Reaction Noted   ??? Aztreonam Anaphylaxis, Hives, Nausea And Vomiting, and Rash 09/06/2015   ??? [place] [aztreonam lysine] Anaphylaxis 12/27/2016   ??? Cefepime Itching, Nausea Only, and Other (See Comments) 09/06/2015   ??? Other Anaphylaxis and Other (See Comments) 09/06/2015   ??? Slo-bid 100 Anaphylaxis 06/20/2017   ??? Tobramycin Tinnitus 09/06/2015   ??? Banana Itching and Nausea And Vomiting 07/29/2016     Genetics:  [id number] and [id number] insertion  ??  Airway Clearance Regimen:  HS BID  Pulmozyme  ??  Inhaled ABX:  TOBI nebs every other month   ??  Hemoptysis:   No  ABPA:             No  Ptx:                  No  Sinusitus:       Yes  ??  Panc Insuf:     Yes  PEG:                No  DIOS:              No bowel issues  CF Liver Dz:   No  ??  Diabetes:        Uncontrolled - follows w/ endocrine  Osteopenia:   N/A  ??  Depression:   Yes  ??  Other Co-morbidities:  N/A  ??  Social Setting:  Lives with wife near [place]    Micro History:  ??  MABSC  - Assumed S to erythro from [facility]  06/2017: MASBC not present, but M gordonae is   ??  MSSA (09/29/19)     Smooth PsA  S: Cipro, Levo, Tobra  I: Aztreo, Cefe, Ceftaz, PT, LVX  ??  Stenotrophamonas  R: Ceftat  ??  MABSC (macrolide sensitive)  Last Negative Cx: 03/2017  Last Positive: 02/21/17  ????  Review of Systems:  A comprehensive review of systems was completed and negative except as noted in HPI.    PHYSICAL EXAM:     Vitals:    07/11/20 1222   BP: 118/81   BP Site: L Arm   BP Position: Sitting   BP Cuff Size: Large   Pulse: 81   Resp: 18   Temp: 36.6 ??C (97.8 ??F)   TempSrc: Oral   SpO2: 96%     There is no height or weight on file to calculate BMI.  Wt Readings from Last 3 Encounters:   06/28/20 (!) 105.8 kg (233 lb 4.8 oz)   01/04/20 (!) 110.7 kg (244 lb)   09/29/19 (!) 113 kg (249 lb 1.9 oz)     GEN: Cooperative male, sitting up on exam table, NAD  HEENT: NCAT, EOMI, sclerae anicteric  NECK: Supple,  trachea midline  LYMPH: No palpable lymphadenopathy   CHEST: Port-a-cath present in left upper chest wall, not currently accessed, some mild skin irritation from recent dressing   HEART/CV: RRR, S1, S2 nl, no MRG  LUNGS/RESP: CTA bilaterally, no crackles or wheezes, normal WOB on RA  ABD/GI: NABS, soft, NT/ND, no rebound or guarding, no masses, no hepatomegaly noted   EXT: No cyanosis, clubbing, or edema  SKIN: Additional crusted superficial wounds in bilateral axillae and inner upper arm   NEURO: No focal deficits noted  PSYCH: Awake, alert, and interactive. Mood and affect appropriate.     LABORATORY and RADIOLOGY DATA:     Pulmonary Function Tests:          Pertinent Laboratory Data:  CF Sputum Culture   Date Value Ref Range Status   07/01/2020 <1+ Smooth Pseudomonas aeruginosa (A)  Final   07/01/2020 <1+ Mucoid Pseudomonas aeruginosa (A)  Final   07/01/2020 1+ Oropharyngeal Flora Isolated  Final        AFB Smear   Date Value Ref Range Status   07/01/2020  No Acid Fast Bacilli Seen Final    NO ACID FAST BACILLI SEEN- 3 negative smears do not exclude pulmonary TB. If active pulmonary TB is suspected, continue airborne isolation until pulmonary disease is excluded by negative cultures.     AFB Culture   Date Value Ref Range Status   09/29/2019 No Acid Fast Bacilli Detected  Final        IgE, Total   Date Value Ref Range Status   01/18/2020 20 6 - 495 IU/mL Final        Total Bilirubin   Date Value Ref Range Status   07/04/2020 0.5 0.3 - 1.2 mg/dL Final     AST   Date Value Ref Range Status   07/04/2020 25 <=34 U/L Final     ALT   Date Value Ref Range Status   07/04/2020 33 10 - 49 U/L Final     Alkaline Phosphatase   Date Value Ref Range Status   07/04/2020 61 46 - 116 U/L Final        INR   Date Value Ref Range Status   06/28/2020 1.78  Final        Hemoglobin A1c   Date Value Ref Range Status   01/18/2020 9.4 (H) 4.8 - 5.6 % Final     Comment:              Prediabetes: 5.7 - 6.4           Diabetes: >6.4           Glycemic control for adults with diabetes: <7.0       Estimated Average Glucose   Date Value Ref Range Status   09/29/2019 206 mg/dL Final        Vitamin A   Date Value Ref Range Status 01/18/2020 42.4 18.9 - 57.3 ug/dL Final     Comment:     Reference intervals for vitamin A determined from [facility] internal  studies. Individuals with vitamin A less than 20 ug/dL are considered  vitamin A deficient and those with serum concentrations less than  10 ug/dL are considered severely deficient.  This test was developed and its performance characteristics  determined by [facility]. It has not been cleared or approved  by the Food and Drug Administration.       Vitamin D Total (25OH)   Date Value Ref Range Status   03/19/2017 27.9  20.0 - 80.0 ng/mL Final

## 2020-07-12 MED ORDER — TRIKAFTA 100-50-75 MG (D)/150 MG (N) TABLETS
ORAL_TABLET | ORAL | 3 refills | 0.00000 days | Status: CP
Start: 2020-07-12 — End: ?
  Filled 2020-07-13: qty 84, 28d supply, fill #0

## 2020-07-12 NOTE — Unmapped (Signed)
[facility] Refill Coordination Note    Specialty Medication(s) to be Shipped:   CF/Pulmonary: -Trikafta  Other medication(s) to be shipped: No additional medications requested for fill at this time     Mardy Lucier, DOB: April 10, 1986  Phone: [phone] (home)     All above HIPAA information was verified with patient.     Was a Nurse, learning disability used for this call? No    Completed refill call assessment today to schedule patient's medication shipment from the [facility] [phone]).       Specialty medication(s) and dose(s) confirmed: Regimen is correct and unchanged.   Changes to medications: Rudolfo reports no changes at this time.  Changes to insurance: No  Questions for the pharmacist: No    Confirmed patient received Welcome Packet with first shipment. The patient will receive a drug information handout for each medication shipped and additional FDA Medication Guides as required.       DISEASE/MEDICATION-SPECIFIC INFORMATION        For CF patients: CF [facility] Active? Yes, **HWG active VST til 05/06/21**    SPECIALTY MEDICATION ADHERENCE     Medication Adherence    Patient reported X missed doses in the last month: 0  Specialty Medication: Trikafta  Patient is on additional specialty medications: No  Informant: patient  Reliability of informant: reliable  Support network for adherence: family member        Trikafta: 7 days of medicine on hand     SHIPPING     Shipping address confirmed in Epic.     Delivery Scheduled: Yes, Expected medication delivery date: 07/14/2020.     Medication will be delivered via UPS to the prescription address in Epic WAM.    Marlow Hendrie P [name] [facility] Specialty Technician

## 2020-07-12 NOTE — Unmapped (Signed)
[facility]  Adult Cystic Fibrosis Clinic      Met with Christian Young and his wife in person at his scheduled clinic visit. Discussed resources to assist with food insecurity and financial concerns and completed a referral for the grocery/gas program through [place]. [name] would like a different nebulizer than the one his insurance approves and this SW provided information on how to request assistance for DME through the [facility]. [name] expressed understanding and will complete the on-line request for the nebulizer machine. This Clinical research associate composed a letter verifying he is an active CF pt per [employer] request, and emailed the letter to [place] to include in his assistance application. [name] requested this SW send it to his personal email.     Explored his mental health and Christian Young states his anxiety is constant and he is having frequent panic attacks. He would like to reconnect with a therapist and he and this SW discussed how to identify a local provider who accepts his insurance.  He will see his psychiatric provider, Suann Larry NP, next week for medication management and states he hopes his medications will be adjusted. Will continue to provide support as needed.

## 2020-07-13 MED FILL — TRIKAFTA 100-50-75 MG (D)/150 MG (N) TABLETS: 28 days supply | Qty: 84 | Fill #0 | Status: AC

## 2020-07-24 NOTE — Unmapped (Deleted)
Chief Complaint:     34 y.o. male with a PMH of CF, HTN w CFRD.   No chief complaint on file.    Subjective:     HPI   CFRD dx'd ~2/19.       Last seen by myself 04/23/2019 (video visit d/t covid pandemic)  At last visit:   ??? Blood sugars very stable overnight, some spikes post meal  ??? Intensify the high blood sugar alarm currently at 390 reduce it to 250-290 range   ??? If spikes post meal increase base Novolog from 30-40 to 35-45 and take 10-20 min before eating  ??? See you in 3 mo, A1c is ordered for you  ??? Eye exam as schedule in Sept :)      Since last visit:         Currently taking the following for CFRD:   Basaglar Lantus insulin 60 units qhs  Aspart insulin ~30 -40  units ac meals, 8 units ac snacks + 4:50>150    Denies symptoms consistent with peripheral or autonomic neuropathy  Denies recent lows requiring assistance  Denies recent nocturnal lows  DFE scheduled setp 21 local      _____________________________________________      Since last visit:   My Video call awakened him this AM!  Been feeling well, feels diet could be better, maybe less fast food  Dropped basaglar to 60, taking 30-40 Novolog ac meal  hospitalizeed a few weeks August [facility] for CF exacerbation, was followed remotely by endo svc (I don't see chart notes this is his recall)  Popcorn chicken and french fries last night caused hyperglycemia last night  Oatmeal for breakfast, will have this soon  Vision blurred, no floaters or spots  A1c ordered and I see order but not run during latest hospital stay, unclear why    Currently taking the following for CFRD:   Basaglar Lantus insulin 60 units qhs  Aspart insulin ~30 -40  units ac meals, 8 units ac snacks + 4:50>150    Denies symptoms consistent with peripheral or autonomic neuropathy  Denies recent lows requiring assistance  Denies recent nocturnal lows  DFE scheduled setp 21 local      Continuous glucose monitor (CGM) was computer downloaded, I personally reviewed greater than 72 hours of data and interpreted, and discussed the findings with the patient.   Data summary pasted in CMA note  14 day summary:  Mean glucose = 175 CV = 29%  TIR = 56%  % low = <1%  % high = 42%  Trend of Some prandial excursions, nocturnal hyperglycemia earlier but lately has resolved      ________________________      Initial HPI carried forward.   Patient is a 34 year old male with a PMH of CF, HTN and GAD/MDD who presents for a recurrence of CFRD.     The patient was initially treated for cystic fibrosis related diabetes (CFRD) when he received his care at [facility] with 5 units of levemir at night. Subsequently, after moving to [place], he was taken off of his diabetes medications which was approximately 3 years ago. Since then he has continued to check his blood sugar approximately three times per day. Recently, his blood sugars have been typically running 200's - 300's. He was scheduled for endocrinology assessment for CFRD and prior to today's appointment, his hemoglobin A1c was found to be 7.6%. Upon arrival to the clinic today, his POCT glucose was 306 mg/dL.  He denies any regular hypglycemic episodes. If he does not eat much, he reports that he will occasionally dip lower than usual (approxiamtely once per week his blood glucose will run <100 mg/dL and approximately once per month his blood glucose will be <60 mg/dL). He states that recently, however, the lowest he has seen is 160 mg/dL. He endorses occasional blurriness in his vision and denies any neuropathic symptoms in his feet. He requires a new meter. His cystic fibrosis affects his lungs, sinuses and GI tract; he endorses pancreatic involvement. He does not appear malnourished. Most recently he was hospitalized in January 2019 for a CF-bronchiectasis exacerbation. He had a RUL lobectomy in August 2018. For his HTN, he takes lisinopril and carvedilol and he takes lamotrigine for his GAD and MDD.    The past medical history, surgical history, family history, social history, medications and allergies were reviewed in Epic.    Past Medical History:   Diagnosis Date   ??? Anxiety    ??? Chronic pain disorder    ??? Cystic fibrosis (CMS-HCC)    ??? Depression    ??? Hypertension    ??? Nonproductive cough 04/05/2018     Past Surgical History:   Procedure Laterality Date   ??? IR INSERT PORT AGE GREATER THAN 5 YRS  03/27/2019    IR INSERT PORT AGE GREATER THAN 5 YRS 03/27/2019 Rush Barer, MD IMG [facility]   ??? PR REMOVAL OF LUNG,LOBECTOMY Right 03/29/2017    Procedure: REMOVAL OF LUNG, OTHER THAN PNEUMONECTOMY; SINGLE LOBE (LOBECTOMY);  Surgeon: Cherie Dark, MD;  Location: MAIN OR [facility];  Service: Thoracic     Current Outpatient Medications on File Prior to Visit   Medication Sig Dispense Refill   ??? albuterol (PROVENTIL HFA;VENTOLIN HFA) 90 mcg/actuation inhaler Inhale 2 puffs every six (6) hours as needed. 1 Inhaler 1   ??? albuterol 2.5 mg/0.5 mL nebulizer solution Inhale 0.5 mL (2.5 mg total) by nebulization Two (2) times a day. 180 each 3   ??? amitriptyline (ELAVIL) 100 MG tablet TAKE ONE TABLET BY MOUTH AT BEDTIME 30 tablet 1   ??? atorvastatin (LIPITOR) 20 MG tablet Take 20 mg by mouth nightly.     ??? BETHKIS 300 mg/4 mL Nebu Inhale 1 vial via nebulization every 12 hours for 28 days, alternating every other 28 days 224 mL 6   ??? blood sugar diagnostic (GLUCOSE BLOOD) Strp by Other route Three (3) times a day with a meal. Rx sent to [facility] 01/04/20     ??? blood-glucose meter kit Dispense meter that is preferred by patient's insurance company 1 each 0   ??? blood-glucose meter,continuous (DEXCOM G6 RECEIVER) Misc Use as directed 1 each 0   ??? budesonide-formoteroL (SYMBICORT) 160-4.5 mcg/actuation inhaler Inhale 2 puffs Two (2) times a day. 30.6 g 3   ??? cetirizine (ZYRTEC) 10 MG tablet TAKE ONE TABLET BY MOUTH AT LUNCH 90 tablet 3   ??? CREON 36,000-114,000- 180,000 unit CpDR TAKE 8 CAPSULES BY MOUTH 3 TIMES DAILY WITH MEALS AND TAKE 4 CAPSULES TWICE DAILY WITH SNACKS *MAY TAKE UP TO 1 EXTRA SNACK DAILY. MAX 36 PER DAY* 1200 capsule 11   ??? DEXCOM G6 SENSOR Devi Use sq as directed every 14 days 6 each 11   ??? DEXCOM G6 TRANSMITTER Devi Use as directed 1 each 11   ??? dornase alfa (PULMOZYME) 1 mg/mL nebulizer solution Inhale 2.5 mg Two (2) times a day. 450 mL 3   ??? EASY TOUCH  LANCING DEVICE Misc Use as directed.     ??? EASY TOUCH TWIST LANCETS 30 gauge Misc Use as directed.     ??? elexacaftor-tezacaftor-ivacaft (TRIKAFTA) 100-50-75 mg(d) /150 mg (n) tablet Take 2 Tablets (orange) by mouth in the morning and 1 tablet (blue) in the evening with fatty food 84 tablet 3   ??? famotidine (PEPCID) 20 MG tablet Take 1 tablet (20 mg total) by mouth daily. 30 tablet 5   ??? FLUoxetine 60 mg Tab Take 60 mg by mouth daily.      ??? fluticasone propionate (FLONASE) 50 mcg/actuation nasal spray USE 1 SPRAY IN EACH NOSTRIL EVERY DAY 16 g 11   ??? gabapentin 600 mg Tb24 Take 600 mg by mouth daily.     ??? insulin ASPART (NOVOLOG FLEXPEN) 100 unit/mL (3 mL) injection pen Use up to 150 units/day, divided 3 TIMES DAILY BEFORE MEALS (Patient taking differently: Inject 30 Units under the skin Three (3) times a day before meals. Use up to 150 units/day, divided 3 TIMES DAILY BEFORE MEALS) 45 mL 0   ??? insulin glargine (BASAGLAR, LANTUS) 100 unit/mL (3 mL) injection pen Inject 0.7 mL (70 Units total) under the skin daily. (Patient taking differently: Inject 50 Units under the skin nightly. ) 30 mL 0   ??? lamoTRIgine (LAMICTAL) 200 MG tablet Take 200 mg by mouth daily.     ??? lisinopriL (PRINIVIL,ZESTRIL) 10 MG tablet Take 1 tablet (10 mg total) by mouth daily. 30 tablet 0   ??? melatonin 10 mg Tab Take 10 mg by mouth nightly.     ??? montelukast (SINGULAIR) 10 mg tablet TAKE ONE TABLET BY MOUTH AT BEDTIME 90 tablet 3   ??? mupirocin (BACTROBAN) 2 % ointment Apply inside of both nostrils and to other affected areas twice daily. 22 g 0   ??? MVW COMPLETE FORMUL PROBIOTIC 40 billion cell -15 mg CpDR Take 1 capsule by mouth daily. (Patient taking differently: Take 1 capsule by mouth Two (2) times a day. ) 90 capsule 3   ??? MVW Complete, pediatric multivit 61-D3-vit K, (MVW COMPLETE FORMULATION) 1,500-800 unit-mcg cap Take 1 capsule by mouth Two (2) times a day.     ??? nebulizers Misc Use as directed with inhaled medications 4 each 3   ??? omeprazole (PRILOSEC) 20 MG capsule Take 20 mg by mouth daily.     ??? pen needle, diabetic (BD ULTRA-FINE NANO PEN NEEDLE) 32 gauge x 5/32 (4 mm) Ndle use up to 4 times daily 400 each 12   ??? [EXPIRED] piperacillin-tazobactam (ZOSYN) 4.5 gram/100 mL PgBk Infuse 100 mL (4.5 g total) into a venous catheter every six (6) hours for 7 days. 2800 mL 0   ??? pramipexole (MIRAPEX) 0.125 MG tablet Take 0.25 mg by mouth every evening.      ??? sodium chloride 7% 7 % Nebu Inhale 4 mL by nebulization Two (2) times a day. Increase to 4 times while taking antibiotics     ??? [EXPIRED] tobramycin 240 mg, OVERFILL 10 mL in sodium chloride 0.9 % 100 mL IVPB Infuse 240 mg into a venous catheter every twelve (12) hours for 6 days. 12 each 0   ??? traZODone (DESYREL) 100 MG tablet Take 1 tablet (100 mg total) by mouth nightly. 30 tablet 0   ??? XARELTO 20 mg tablet Take 20 mg by mouth daily.       No current facility-administered medications on file prior to visit.     SOCIAL HISTORY:  Social History  Social History Narrative   ??? Not on file       FAMILY HISTORY:  Family History   Problem Relation Age of Onset   ??? Bipolar disorder Mother    ??? Depression Mother      ROS:  The balance of 10 systems was reviewed and unremarkable except as stated above.     Objective:   There were no vitals taken for this visit.    ***  Alert and oriented with appropriate mood and affect, appears well and in no distress and in good spirits on video  NECK: Supple without carotid bruits, thyromegaly or lymphadenopathy.   HEART: Regular rate and rhythm without murmur, rub, gallop.  ABDOMEN: Soft and nontender without masses.   EXTREMITIES: Showed no edema    REVIEW OF RECENT PERTINENT LABORATORY AND IMAGING DATA:    Lab Results   Component Value Date    A1C 9.4 (H) 01/18/2020    A1C 8.8 (H) 09/29/2019    A1C 8.5 (H) 08/12/2018    A1C 10.7 (H) 04/06/2018    A1C 9.0 (H) 01/07/2018    A1C 6.1 (H) 11/19/2016    A1C 6.3 (H) 11/18/2016     Lab Results   Component Value Date    CREATININE 0.82 07/11/2020     Lab Results   Component Value Date    GFRNONAA 114 01/18/2020     Lab Results   Component Value Date    GFRAA 132 01/18/2020     Lab Results   Component Value Date    K 3.9 07/11/2020     No results found for: CHOL  No results found for: LDL  No results found for: HDL  No results found for: TRIG  Lab Results   Component Value Date    ALT 33 07/04/2020    ALT 31 01/18/2020     No results found for: LABCREA, ALBQTUR, MSHCGMOM, ALBCRERAT  No results found for: VITAMINB12  Lab Results   Component Value Date    TSH 2.120 11/18/2016     Lab Results   Component Value Date    WBC 5.1 07/04/2020    HGB 11.9 (L) 07/04/2020    HCT 35.7 (L) 07/04/2020    PLT 281 07/04/2020     Lab Results   Component Value Date    VITDTOTAL 27.9 03/19/2017     Lab Results   Component Value Date    CALCIUM 9.6 07/11/2020       Assessment/Plan:   1. Diabetes mellitus related to CF (cystic fibrosis) (CMS-HCC)  -He's doing well w insulin dosing and CGM, latest CGM data implies basal insulin dose is appropriate given recent stability overnight  -Hyperglycemia on CGM, A1c ordered and I see order but not run during latest hospital stay, unclear why  -Previously discussed details of pump therapy, he'd be a good candidate, might benefit from CHO counting for prandial dosing, not critical in my opinion however given ~glycemic stability  Patient Instructions   It was a pleasure to see you today!  ??? Blood sugars very stable overnight, some spikes post meal  ??? Intensify the high blood sugar alarm currently at 390 reduce it to 250-290 range   ??? If spikes post meal increase base Novolog from 30-40 to 35-45 and take 10-20 min before eating  ??? See you in 3 mo, A1c is ordered for you  ??? Eye exam as schedule in Sept :)    2. Encounter for long-term (current) use of insulin (  CMS-HCC)  As above    3. Obesity  Noted, encouraged dietary efforts, though need to balance against potential for malabsorption/caloric deficits related to CF    Follow up recommended in about 3 mo, he is aware to contact us or return sooner for interim concerns.

## 2020-07-26 MED ORDER — INSULIN ASPART (U-100) 100 UNIT/ML (3 ML) SUBCUTANEOUS PEN
0 refills | 0 days
Start: 2020-07-26 — End: ?

## 2020-07-28 NOTE — Unmapped (Deleted)
Otolaryngology Clinic Note    Christian Young is a 34 y.o. male is seen in consultation at the request of Verlin Fester  for evaluation of CRS in a setting of cystic fibrosis.       History of Present Illness:     The patient is a 34 y.o. male who  has a past medical history of Anxiety, Chronic pain disorder, Cystic fibrosis (CMS-HCC), Depression, Hypertension, and Nonproductive cough (04/05/2018). who presents for the evaluation of CRS in a setting of cystic fibrosis.    Per the referring provider, He was recently admitted from 06/28/20 - 07/04/20 for a CF exacerbation and finishes 2 weeks of IV Tobramycin and IV Zosyn today (07/11/20). He feels like it all started with a sinus infection about a month prior. His FEV1 is stable at 68%.     Patients medical records were personally reviewed.      A 12 point review of systems was negative except as indicated.  The patient denies fevers, chills, shortness of breath, chest pain, nausea, vomiting, diarrhea, inability to lie flat, dysphagia, odynophagia, hemoptysis, hematemesis, changes in vision, changes in voice quality, otalgia, otorrhea, vertiginous symptoms, focal deficits, or other concerning symptoms.    Past Medical History     has a past medical history of Anxiety, Chronic pain disorder, Cystic fibrosis (CMS-HCC), Depression, Hypertension, and Nonproductive cough (04/05/2018).    Past Surgical History     has a past surgical history that includes pr removal of lung,lobectomy (Right, 03/29/2017) and IR Insert Port Age Greater Than 5 Years (03/27/2019).    Current Medications    Current Outpatient Medications   Medication Sig Dispense Refill   ??? albuterol (PROVENTIL HFA;VENTOLIN HFA) 90 mcg/actuation inhaler Inhale 2 puffs every six (6) hours as needed. 1 Inhaler 1   ??? albuterol 2.5 mg/0.5 mL nebulizer solution Inhale 0.5 mL (2.5 mg total) by nebulization Two (2) times a day. 180 each 3   ??? amitriptyline (ELAVIL) 100 MG tablet TAKE ONE TABLET BY MOUTH AT BEDTIME 30 tablet 1   ??? atorvastatin (LIPITOR) 20 MG tablet Take 20 mg by mouth nightly.     ??? BETHKIS 300 mg/4 mL Nebu Inhale 1 vial via nebulization every 12 hours for 28 days, alternating every other 28 days 224 mL 6   ??? blood sugar diagnostic (GLUCOSE BLOOD) Strp by Other route Three (3) times a day with a meal. Rx sent to [facility] 01/04/20     ??? blood-glucose meter kit Dispense meter that is preferred by patient's insurance company 1 each 0   ??? blood-glucose meter,continuous (DEXCOM G6 RECEIVER) Misc Use as directed 1 each 0   ??? budesonide-formoteroL (SYMBICORT) 160-4.5 mcg/actuation inhaler Inhale 2 puffs Two (2) times a day. 30.6 g 3   ??? cetirizine (ZYRTEC) 10 MG tablet TAKE ONE TABLET BY MOUTH AT LUNCH 90 tablet 3   ??? CREON 36,000-114,000- 180,000 unit CpDR TAKE 8 CAPSULES BY MOUTH 3 TIMES DAILY WITH MEALS AND TAKE 4 CAPSULES TWICE DAILY WITH SNACKS *MAY TAKE UP TO 1 EXTRA SNACK DAILY. MAX 36 PER DAY* 1200 capsule 11   ??? DEXCOM G6 SENSOR Devi Use sq as directed every 14 days 6 each 11   ??? DEXCOM G6 TRANSMITTER Devi Use as directed 1 each 11   ??? dornase alfa (PULMOZYME) 1 mg/mL nebulizer solution Inhale 2.5 mg Two (2) times a day. 450 mL 3   ??? EASY TOUCH LANCING DEVICE Misc Use as directed.     ???  EASY TOUCH TWIST LANCETS 30 gauge Misc Use as directed.     ??? elexacaftor-tezacaftor-ivacaft (TRIKAFTA) 100-50-75 mg(d) /150 mg (n) tablet Take 2 Tablets (orange) by mouth in the morning and 1 tablet (blue) in the evening with fatty food 84 tablet 3   ??? famotidine (PEPCID) 20 MG tablet Take 1 tablet (20 mg total) by mouth daily. 30 tablet 5   ??? FLUoxetine 60 mg Tab Take 60 mg by mouth daily.      ??? fluticasone propionate (FLONASE) 50 mcg/actuation nasal spray USE 1 SPRAY IN EACH NOSTRIL EVERY DAY 16 g 11   ??? gabapentin 600 mg Tb24 Take 600 mg by mouth daily.     ??? insulin ASPART (NOVOLOG FLEXPEN) 100 unit/mL (3 mL) injection pen Use up to 150 units/day, divided 3 TIMES DAILY BEFORE MEALS (Patient taking differently: Inject 30 Units under the skin Three (3) times a day before meals. Use up to 150 units/day, divided 3 TIMES DAILY BEFORE MEALS) 45 mL 0   ??? insulin glargine (BASAGLAR, LANTUS) 100 unit/mL (3 mL) injection pen Inject 0.7 mL (70 Units total) under the skin daily. (Patient taking differently: Inject 50 Units under the skin nightly. ) 30 mL 0   ??? lamoTRIgine (LAMICTAL) 200 MG tablet Take 200 mg by mouth daily.     ??? lisinopriL (PRINIVIL,ZESTRIL) 10 MG tablet Take 1 tablet (10 mg total) by mouth daily. 30 tablet 0   ??? melatonin 10 mg Tab Take 10 mg by mouth nightly.     ??? montelukast (SINGULAIR) 10 mg tablet TAKE ONE TABLET BY MOUTH AT BEDTIME 90 tablet 3   ??? mupirocin (BACTROBAN) 2 % ointment Apply inside of both nostrils and to other affected areas twice daily. 22 g 0   ??? MVW COMPLETE FORMUL PROBIOTIC 40 billion cell -15 mg CpDR Take 1 capsule by mouth daily. (Patient taking differently: Take 1 capsule by mouth Two (2) times a day. ) 90 capsule 3   ??? MVW Complete, pediatric multivit 61-D3-vit K, (MVW COMPLETE FORMULATION) 1,500-800 unit-mcg cap Take 1 capsule by mouth Two (2) times a day.     ??? nebulizers Misc Use as directed with inhaled medications 4 each 3   ??? omeprazole (PRILOSEC) 20 MG capsule Take 20 mg by mouth daily.     ??? pen needle, diabetic (BD ULTRA-FINE NANO PEN NEEDLE) 32 gauge x 5/32 (4 mm) Ndle use up to 4 times daily 400 each 12   ??? pramipexole (MIRAPEX) 0.125 MG tablet Take 0.25 mg by mouth every evening.      ??? sodium chloride 7% 7 % Nebu Inhale 4 mL by nebulization Two (2) times a day. Increase to 4 times while taking antibiotics     ??? traZODone (DESYREL) 100 MG tablet Take 1 tablet (100 mg total) by mouth nightly. 30 tablet 0   ??? XARELTO 20 mg tablet Take 20 mg by mouth daily.       No current facility-administered medications for this visit.       Allergies    Allergies   Allergen Reactions   ??? Aztreonam Anaphylaxis, Hives, Nausea And Vomiting and Rash     fevers  Patient stated that he only vomited x1 with [place] in the past.????    fevers  Patient stated that he only vomited x1 with [place] in the past.????   ??? [place] [Aztreonam Lysine] Anaphylaxis   ??? Cefepime Itching, Nausea Only and Other (See Comments)     Headaches also   ???  Other Anaphylaxis and Other (See Comments)     Other reaction(s): Other (See Comments)  Bananas: itchy throat  Slo Bid record from [employer] states anaphylaxis.????Pt states this was from childhood and does not know reaction.  Bananas, causes itchy throat   ??? Slo-Bid 100 Anaphylaxis   ??? Tobramycin Tinnitus     From OSH record-documented as tinnitus but has received IV tobra with close monitoring.  Pt has received Tobramycin at [facility] since this allergy documented    From OSH record-documented as tinnitus but has received IV tobra with close monitoring.   ??? Banana Itching and Nausea And Vomiting       Family History    Negative for bleeding disorders or free bleeding.     family history includes Bipolar disorder in his mother; Depression in his mother.    Social History:     reports that he has quit smoking. His smoking use included e-cigarettes. He has never used smokeless tobacco.   reports current alcohol use of about 3.0 standard drinks of alcohol per week.   reports no history of drug use.    Review of Systems    A 12 system review of systems was performed and is negative other than that noted in the history of present illness.    Vital Signs  There were no vitals taken for this visit.    RSDI today was ***.      Physical Exam    General: Well-developed, well-nourished. Appropriate, comfortable, and in no apparent distress.  Head/Face: On external examination there is no obvious asymmetry or scars. On palpation there is no tenderness over maxillary sinuses or masses within the salivary glands. Cranial nerves V and VII are intact through all distributions.  Eyes: PERRL, EOMI, the conjunctiva are not injected and sclera is non-icteric.  Ears: On external exam, there is no obvious lesions or asymmetry. The EACs are bilaterally without cerumen or lesions. The TMs are in the neutral position and are mobile to pneumatic otoscopy bilaterally. There are no middle ear masses or fluid noted. Hearing is grossly intact bilaterally.  Nose: On external exam there are neither lesions nor asymmetry of the nasal tip/ dorsum. On anterior rhinoscopy, visualization posteriorly is limited on anterior examination. For this reason, to adequately evaluate posteriorly for masses, polypoid disease and/or signs of infections, nasal endoscopy is indicated (see procedure below).  Oral cavity/oropharynx: The mucosa of the lips, gums, hard and soft palate, posterior pharyngeal wall, tongue, floor of mouth, and buccal region are without masses or lesions and are normally hydrated. Good dentition. Tongue protrudes midline. Tonsils are normal appearing. Supraglottis not visualized due to gag reflex.  Neck: There is no asymmetry or masses. Trachea is midline. There is no enlargement of the thyroid or palpable thyroid nodules.   Lymphatics: There is no palpable lymphadenopathy along the jugulodiagastric, submental, or posterior cervical chains.  Chest: No audible wheeze, unlabored respirations.  Cardiovascular: Regular rate.  GI: Nondistended.  Neurologic: Cranial nerve???s II-XII are grossly intact. Exam is non-focal.  Extremities: No cyanosis, clubbing or edema.    Procedures:  Diagnostic Bilateral Nasal Endoscopy (CPT [phone])    NOTE: Nasal endoscopy is performed for the sinuses only, and not for examination of the skull base, septum or inferior turbinates, nor is it related to any previously performed septoplasty or inferior turbinate surgery or skull base surgery.     Surgeon: Egbert Garibaldi, MD  Anesthesia: none  Procedure Detail:  As a result of inability to visualize  the intranasal anatomy, and after discussion of the potential risks related to the procedure (primarily bleeding), a endoscope is used to examine the left and right sinonasal cavities, including the interior of the nasal cavity and the middle and superior meatus, the turbinates, and the spheno-ethmoid recess. All these areas were inspected.    Findings:    Examination on the left reveals an intact nasal septum with no associated masses, lesions, or friable mucosa. The left middle meatus, sphenoethmoidal recess, and skull base are clear with no evidence of purulence, polyposis, or polypoid edema.    Examination on the right reveals an intact nasal septum with no associated masses, lesions, or friable mucosa. The right middle meatus, sphenoethmoidal recess, and skull base are clear with no evidence of purulence, polyposis, or polypoid edema.      Oretha Ellis Nasal Endoscopy Score: [name] is used to assess the degree of inflammation of the sinonasal structures, including the middle and superior turbinates, the ethmoid sinuses, maxillary sinuses, frontal sinuses, and sphenoid sinuses.  In the presence of previous surgery, some or all of these structures may be absent.    Left        ?? Polyps:  {Lund Kyung Rudd Scorring Polyps:30552}   ?? Edema:   {Lund Kyung Rudd ZOXWR:60454}   ?? Discharge:  {Lund Kennedy discharge:30554}    ?? Scarring:  {Lund Kennedy Scoring scarring:30549}   ?? Crusting:  {Lund Kennedy Crusting:30555}      Total Left:  {lund kennedy scoring 2:30703}     Right         ?? Polyps:  {Lund Kyung Rudd Scorring Polyps:30552}   ?? Edema:  {Lund Kyung Rudd UJWJX:91478}   ?? Discharge: {Lund Kennedy discharge:30554}    ?? Scarring:  {Lund Kennedy Scoring scarring:30549}   ?? Crusting:  {Lund Kennedy Crusting:30555}      Total Right:   {lund kennedy scoring 2:30703}      Labs and Diagnostic Tests  ***      Assessment:  The patient is a 34 y.o. male who  has a past medical history of Anxiety, Chronic pain disorder, Cystic fibrosis (CMS-HCC), Depression, Hypertension, and Nonproductive cough (04/05/2018). who presents for the evaluation of:  {ENT Rhino CC:22508:p}    Recommendations:  1.     The patient's physical examination findings including flexible fiberoptic nasopharyngolaryngosocpy/sinonasal endoscopy were thoroughly discussed.    The patient voiced complete understanding of plan as detailed above and is in full agreement.    Scribe's Attestation: {entattendings:49476} obtained and performed the history, physical exam and medical decision making elements that were entered into the chart. Signed by Ernie Avena, Scribe, on ***     {*** NOTE TO PROVIDER: PLEASE ADD ATTESTATION NOTING YOU AGREE WITH SCRIBE DOCUMENTATION}

## 2020-08-03 NOTE — Unmapped (Signed)
[facility] Refill Coordination Note    Specialty Medication(s) to be Shipped:   CF/Pulmonary: -Trikafta  Other medication(s) to be shipped: No additional medications requested for fill at this time     Jeran Hiltz, DOB: [birth year]  Phone: [phone] (home)     All above HIPAA information was verified with patient.     Was a Nurse, learning disability used for this call? No    Completed refill call assessment today to schedule patient's medication shipment from the [facility] [phone]).       Specialty medication(s) and dose(s) confirmed: Regimen is correct and unchanged.   Changes to medications: Herley reports no changes at this time.  Changes to insurance: No  Questions for the pharmacist: No    Confirmed patient received Welcome Packet with first shipment. The patient will receive a drug information handout for each medication shipped and additional FDA Medication Guides as required.       DISEASE/MEDICATION-SPECIFIC INFORMATION        For CF patients: CF Healthwell [facility] Active? Yes, **HWG active VST til 05/06/21**    SPECIALTY MEDICATION ADHERENCE     Medication Adherence    Patient reported X missed doses in the last month: 0  Specialty Medication: Trikafta  Patient is on additional specialty medications: No  Informant: patient  Reliability of informant: reliable  Support network for adherence: family member        Trikafta: 7+ days of medicine on hand     SHIPPING     Shipping address confirmed in Epic.     Delivery Scheduled: Yes, Expected medication delivery date: 08/09/2020.     Medication will be delivered via UPS to the prescription address in Epic WAM.    Shaelee Forni P [name] [facility] Specialty Technician

## 2020-08-08 MED FILL — TRIKAFTA 100-50-75 MG (D)/150 MG (N) TABLETS: 28 days supply | Qty: 84 | Fill #1 | Status: AC

## 2020-08-08 MED FILL — TRIKAFTA 100-50-75 MG (D)/150 MG (N) TABLETS: 28 days supply | Qty: 84 | Fill #1

## 2020-08-31 NOTE — Unmapped (Signed)
[facility] Refill Coordination Note    Specialty Medication(s) to be Shipped:   CF/Pulmonary: -Trikafta  Other medication(s) to be shipped: No additional medications requested for fill at this time     Christian Young, DOB: 1986-05-27  Phone: [phone] (home)     All above HIPAA information was verified with patient.     Was a Nurse, learning disability used for this call? No    Completed refill call assessment today to schedule patient's medication shipment from the [facility] [phone]).       Specialty medication(s) and dose(s) confirmed: Regimen is correct and unchanged.   Changes to medications: Alexandr reports no changes at this time.  Changes to insurance: No  Questions for the pharmacist: No    Confirmed patient received Welcome Packet with first shipment. The patient will receive a drug information handout for each medication shipped and additional FDA Medication Guides as required.       DISEASE/MEDICATION-SPECIFIC INFORMATION        For CF patients: CF Healthwell Grant Active? Yes,**HWG active VST til 05/06/21**    SPECIALTY MEDICATION ADHERENCE     Medication Adherence    Patient reported X missed doses in the last month: 0  Specialty Medication: Trikafta  Patient is on additional specialty medications: No  Informant: patient  Reliability of informant: reliable  Reasons for non-adherence: no problems identified  Support network for adherence: family member        Trikafta: 7+ days of medicine on hand     SHIPPING     Shipping address confirmed in Epic.     Delivery Scheduled: Yes, Expected medication delivery date: 09/06/2020.     Medication will be delivered via UPS to the prescription address in Epic WAM.    Darlean Warmoth P [name] [facility] Specialty Technician

## 2020-09-01 NOTE — Unmapped (Addendum)
09/01/20  Christian Young left a message this CF coordinator. He reports he was admitted to his local hospital, [facility], for a bowel obstruction. He is now home, but asking about diet changes he should be making. CF team updated.     Christian Young is scheduled to return to clinic on Monday, January 24.  Dr.Jakharia requesting the discharge summary or chart notes from [facility] hospital stay.      09/05/20 2:27 PM  Christian Young rescheduled his appointment to the next available Monday with Dr [name], March 21. Per front desk staff, he was rescheduling due to the ice/road conditions.      Reached out to [place] to see how he was feeling since his hospital stay for a small bowel obstruction. Unable to speak with Christian Young and had to leave a message.     Has reached out twice to [facility] to request a discharge summary from that stay.     Per Care Everywhere, Christian Young went to urgent care for lower back pain with radiation tot he right buttock and foot.     Update routed to Dr [name].       09/06/20  Received discharge summary from [facility]. Have also been in communication with Dr [name]. He would like to see Christian Young sooner than his March 21st appointment.     Left message with Christian Young about returning to clinic on Monday, February 21st with Durenda Hurt Rupcich. This happens to be day when Dr [name] is in clinic (template full) but could collaborate with Durenda Hurt with Christian Young's care.     MyChart message sent to [facility] requesting confirmation of his appointment in February.     09/26/20 12:40 PM  Left message with Christian Young noting that Dr [name] had wanted to see him sooner after his hospitalization for a bowel obstruction. Asked [facility] to confirm this appointment for next Monday.       [name], RN  CF Nurse Coordinator   [phone]

## 2020-09-02 MED ORDER — BACLOFEN 10 MG TABLET
0.00000 days
Start: 2020-09-02 — End: ?

## 2020-09-02 NOTE — Unmapped (Signed)
Christian Young left message with CF nurse coordinator requesting guidance about diet advancement following SBO. He was admitted to his local hospital for ~3-4 days and discharged on a full liquid diet with no additional information.     Spoke with him via telephone this morning. Encouraged him to consume soft, easily digestible foods and focus on hydrating through the weekend. Plan to follow up in person in CF clinic on Monday.     Jackqulyn Livings MPH, RD, LDN  Pager: [phone]

## 2020-09-05 MED FILL — TRIKAFTA 100-50-75 MG (D)/150 MG (N) TABLETS: 28 days supply | Qty: 84 | Fill #2

## 2020-09-07 ENCOUNTER — Ambulatory Visit: Admit: 2020-09-07 | Discharge: 2020-09-08 | Payer: PRIVATE HEALTH INSURANCE

## 2020-09-09 MED ORDER — INSULIN ASPART (U-100) 100 UNIT/ML (3 ML) SUBCUTANEOUS PEN
0 refills | 0.00000 days
Start: 2020-09-09 — End: ?

## 2020-10-03 ENCOUNTER — Ambulatory Visit: Admit: 2020-10-03 | Discharge: 2020-10-04 | Payer: PRIVATE HEALTH INSURANCE

## 2020-10-03 ENCOUNTER — Telehealth: Admit: 2020-10-03 | Discharge: 2020-10-04 | Payer: PRIVATE HEALTH INSURANCE

## 2020-10-03 ENCOUNTER — Ambulatory Visit
Admit: 2020-10-03 | Discharge: 2020-10-04 | Payer: PRIVATE HEALTH INSURANCE | Attending: Registered" | Primary: Registered"

## 2020-10-03 NOTE — Unmapped (Signed)
Goals and plans we discussed today:  1. Continue miralax and atleast aim for 2 bowel movements  2. Fax MRI and spine doctors notes to [phone]  3. Get your 4th dose covid booster  4. Send a spirometry reading via mychart      Thank you for allowing [name] to be a part of your care!     To reach your CF nurse coordinators:    Patients with the last name A-K: Joni Reining [phone]  Patients with the last name L-Z: Harriett Sine [phone]     For urgent issues after hours/weekends:  Hospital Operator: [phone]) [phone], ask for Pulmonary Fellow on-call     To make or change a clinic appointment:   [facility]: [phone]     --> When you should use MyChart:           - Order a prescription refill          - View test results          - Send a non-urgent message or update to the care team          - View after-visit summaries           - See or pay bills      --> When you should call (NOT use MyChart)           - Increase in cough          - Change in amount of mucus or mucus color           - Coughing up blood or blood-tinged mucus          - Chest pain          - Shortness of breath           - Lack of energy, feeling sick, or increase in tiredness     --> I don't have a MyChart. Why should I get one?           - It's encrypted, so your information is secure          - It's a quick, easy way to contact the care team, manage appointments, see test results, and more!      --> How do I sign-up for MyChart?            - Download the MyChart app from the [employer] or [employer] and sign-up in the app           - Sign-up online at [email or web address]

## 2020-10-03 NOTE — Unmapped (Signed)
The [facility] has made a second and final attempt to reach this patient to refill the following medication:  Trikafta.      We have left voicemails on the following phone numbers: [phone] (H) and have sent a MyChart message.    Dates contacted: 02/16 & 02/21    Last scheduled delivery: 09/05/2020    The patient may be at risk of non-compliance with this medication. The patient should call the [facility] at [phone] (option 4) to refill medication.    Marquiz Sotelo Leodis Binet   [facility] Shared [facility] Specialty Technician

## 2020-10-03 NOTE — Unmapped (Signed)
Pulmonary Clinic - Video Follow-up Visit    ASSESSMENT     Christian Young is a 35 y.o. male with cystic fibrosis [phone] and [id number] insertion) who presents to clinic for hospital follow-up.    Overall, post his admission for DIOS, patient seems to be doing well.  He continues with MiraLAX once or twice a day and has 1-2 bowel movements every day.  Advised him to let us know if his frequency of bowel movements reduces less than that.  For his lower back pain, he is awaiting an MRI as well as his visit with the spine doctor afterwards, and I gave him our fax numbers to fax as the MRI results as well.    We counseled him in detail regarding movements and doing physical therapy which will help him get back on his feet faster than just sitting around.  We also counseled him regarding Covid and Covid precautions as well as taking a fourth dose for Covid booster given his condition of CF and a local immunocompromised state in his lungs.  We advised him to let us know if he turns positive for Covid ever given new therapies available for patients.    Other than that, advised him to continue on his CF therapies as below.    Problem List Items Addressed This Visit        Respiratory    Cystic fibrosis (CMS-HCC) - Primary      Other Visit Diagnoses     DIOS (distal intestinal obstruction syndrome) (CMS-HCC)        Chronic midline low back pain with sciatica, sciatica laterality unspecified              PLAN     -Complete MRI and follow-up with spine clinic  -Continue Trikafta  -Plan for ordering DEXA scan at next visit  -Continue airway clearance with 7% hypertonic saline, vest  -Continue Flonase, Singulair and Symbicort  -Continue GI enzymes  -Continue insulin per endocrine, improved sugar control  -Advised to watch his diet  -Counseled regarding Covid vaccine, Covid booster and new therapies available if Covid positive    The patient will return to clinic in 3 months    Patient was seen, staffed and discussed with Dr. Luellen Pucker, MD  PGY 6, Pulmonary and Critical Care  Pager: [phone]  October 03, 2020 12:48 PM       HISTORY:     Interval History:  Diagnosed at 47 with chronic infections, had consolidation and parapneumonic effusion,sent to [facility] for treatment since then. [id number] and [id number] insertion. 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    Patient was recently admitted for 3 days at a local hospital.  He had gone in to complain about lower back pain however on imaging, CT abdomen showed dilated bowel loops, ileus and backed up stool and he was admitted for partial small bowel obstruction.  During hospitalization, he was put on liquid diet and was given laxatives which had him pass good stool every day almost having diarrhea and he was discharged home with instructions to take liquid diet.  He then spoke with our CF RD, who advised him to start taking regular diet and laxatives as he does and report to [name] if anything changes.  He notes that he has been doing well from that standpoint.  He did lose some weight during his liquid diet week however overall has lost 16 pounds since November 2021.  His low back pain however continued and he had to visit urgent care and was told he likely has sciatica for which she was referred to see a spine doctor at [facility].  He was at the doctor last week who felt this could very likely be sciatica related however an MRI has been ordered and pending.  Further plan regarding treatment will be discussed with him by the spine doctor after his MRI is done.      Currently, he notes not having sinus symptoms or chest congestion or flatulent dyspepsia or constipation.  Notes continuing low back pain with tingling and numbness radiating down his right leg.  He has been on protected medical leave from his work and is applying for short-term disability.  Denies having any loss of appetite or abdominal pain or nausea vomiting, and takes his enzymes regularly.    He and his wife are living on their own in [place]. [name] is working full-time at [employer]. His wife is working as an Museum/gallery exhibitions officer and is school to be a Radiation protection practitioner.    Cystic fibrosis issues:    Sinuses: Uses Flonase and takes Claritin and Singulair. No congestion, drainage or pressure currently, but thinks he's had 3-4 sinus infections last year.     Airway clearance: Does albuterol, and 7% hypertonic saline twice a day.  Reports compliance to it.  Does not do Pulmozyme. Uses the vest 30 mins twice a day.  Uses Tobi nebs every other month.     GI and nutrition: Denies any constipation or diarrhea.  Reports compliance to his Creon.  Has lost 15 in the past 2-3 months.     Hypertension: On lisinopril 10 mg daily. Was on Coreg before, which was started in 2018 as an inpatient for sinus tachycardia.     Diabetes: Followed and managed by endocrinology.  Reports compliance to his insulin. Since weight loss, has decreased insulin to Lantus 50 units daily and Novolog 30 units with meals. Now has a Therapist, art and it works much better for him than [employer].     Mental health issues: Much improved on Trikafta now that he is working full-time and not as sick. Following with local Psychiatrist, no recent med changes.     Past Medical History:   Diagnosis Date   ??? Anxiety    ??? Chronic pain disorder    ??? Cystic fibrosis (CMS-HCC)    ??? Depression    ??? Hypertension    ??? Nonproductive cough 04/05/2018     Past Surgical History:   Procedure Laterality Date   ??? IR INSERT PORT AGE GREATER THAN 5 YRS  03/27/2019    IR INSERT PORT AGE GREATER THAN 5 YRS 03/27/2019 Rush Barer, MD IMG [facility]   ??? PR REMOVAL OF LUNG,LOBECTOMY Right 03/29/2017    Procedure: REMOVAL OF LUNG, OTHER THAN PNEUMONECTOMY; SINGLE LOBE (LOBECTOMY);  Surgeon: Cherie Dark, MD;  Location: MAIN OR [facility];  Service: Thoracic       Other History:  The social history and family history were personally reviewed and updated in the patient's electronic medical record.     Home Medications:  Current Outpatient Medications on File Prior to Visit   Medication Sig Dispense Refill   ??? albuterol (PROVENTIL HFA;VENTOLIN HFA) 90 mcg/actuation inhaler Inhale 2 puffs every six (6) hours as needed. 1 Inhaler 1   ??? albuterol 2.5 mg/0.5 mL nebulizer solution Inhale 0.5 mL (2.5 mg total) by nebulization Two (2) times a day. 180 each 3   ???  amitriptyline (ELAVIL) 100 MG tablet TAKE ONE TABLET BY MOUTH AT BEDTIME 30 tablet 1   ??? atorvastatin (LIPITOR) 20 MG tablet Take 20 mg by mouth nightly.     ??? BETHKIS 300 mg/4 mL Nebu Inhale 1 vial via nebulization every 12 hours for 28 days, alternating every other 28 days 224 mL 6   ??? blood sugar diagnostic (GLUCOSE BLOOD) Strp by Other route Three (3) times a day with a meal. Rx sent to [facility] 01/04/20     ??? blood-glucose meter kit Dispense meter that is preferred by patient's insurance company 1 each 0   ??? blood-glucose meter,continuous (DEXCOM G6 RECEIVER) Misc Use as directed 1 each 0   ??? budesonide-formoteroL (SYMBICORT) 160-4.5 mcg/actuation inhaler Inhale 2 puffs Two (2) times a day. 30.6 g 3   ??? cetirizine (ZYRTEC) 10 MG tablet TAKE ONE TABLET BY MOUTH AT LUNCH 90 tablet 3   ??? CREON 36,000-114,000- 180,000 unit CpDR TAKE 8 CAPSULES BY MOUTH 3 TIMES DAILY WITH MEALS AND TAKE 4 CAPSULES TWICE DAILY WITH SNACKS *MAY TAKE UP TO 1 EXTRA SNACK DAILY. MAX 36 PER DAY* 1200 capsule 11   ??? DEXCOM G6 SENSOR Devi Use sq as directed every 14 days 6 each 11   ??? DEXCOM G6 TRANSMITTER Devi Use as directed 1 each 11   ??? dornase alfa (PULMOZYME) 1 mg/mL nebulizer solution Inhale 2.5 mg Two (2) times a day. 450 mL 3   ??? EASY TOUCH LANCING DEVICE Misc Use as directed.     ??? EASY TOUCH TWIST LANCETS 30 gauge Misc Use as directed.     ??? elexacaftor-tezacaftor-ivacaft (TRIKAFTA) 100-50-75 mg(d) /150 mg (n) tablet Take 2 Tablets (orange) by mouth in the morning and 1 tablet (blue) in the evening with fatty food 84 tablet 3   ??? famotidine (PEPCID) 20 MG tablet Take 1 tablet (20 mg total) by mouth daily. 30 tablet 5   ??? FLUoxetine 60 mg Tab Take 60 mg by mouth daily.      ??? fluticasone propionate (FLONASE) 50 mcg/actuation nasal spray USE 1 SPRAY IN EACH NOSTRIL EVERY DAY 16 g 11   ??? gabapentin 600 mg Tb24 Take 600 mg by mouth daily.     ??? insulin ASPART (NOVOLOG FLEXPEN) 100 unit/mL (3 mL) injection pen Use up to 150 units/day, divided 3 TIMES DAILY BEFORE MEALS 45 mL 0   ??? insulin glargine (BASAGLAR, LANTUS) 100 unit/mL (3 mL) injection pen Inject 0.7 mL (70 Units total) under the skin daily. (Patient taking differently: Inject 50 Units under the skin nightly. ) 30 mL 0   ??? lamoTRIgine (LAMICTAL) 200 MG tablet Take 200 mg by mouth daily.     ??? lisinopriL (PRINIVIL,ZESTRIL) 10 MG tablet Take 1 tablet (10 mg total) by mouth daily. 30 tablet 0   ??? melatonin 10 mg Tab Take 10 mg by mouth nightly.     ??? montelukast (SINGULAIR) 10 mg tablet TAKE ONE TABLET BY MOUTH AT BEDTIME 90 tablet 3   ??? mupirocin (BACTROBAN) 2 % ointment Apply inside of both nostrils and to other affected areas twice daily. 22 g 0   ??? MVW COMPLETE FORMUL PROBIOTIC 40 billion cell -15 mg CpDR Take 1 capsule by mouth daily. (Patient taking differently: Take 1 capsule by mouth Two (2) times a day. ) 90 capsule 3   ??? MVW Complete, pediatric multivit 61-D3-vit K, (MVW COMPLETE FORMULATION) 1,500-800 unit-mcg cap Take 1 capsule by mouth Two (2) times a day.     ???  nebulizers Misc Use as directed with inhaled medications 4 each 3   ??? omeprazole (PRILOSEC) 20 MG capsule Take 20 mg by mouth daily.     ??? pen needle, diabetic (BD ULTRA-FINE NANO PEN NEEDLE) 32 gauge x 5/32 (4 mm) Ndle use up to 4 times daily 400 each 12   ??? pramipexole (MIRAPEX) 0.125 MG tablet Take 0.25 mg by mouth every evening.      ??? sodium chloride 7% 7 % Nebu Inhale 4 mL by nebulization Two (2) times a day. Increase to 4 times while taking antibiotics     ??? traZODone (DESYREL) 100 MG tablet Take 1 tablet (100 mg total) by mouth nightly. 30 tablet 0   ??? XARELTO 20 mg tablet Take 20 mg by mouth daily.       No current facility-administered medications on file prior to visit.        Allergies:  Allergies as of 10/03/2020 - Reviewed 07/11/2020   Allergen Reaction Noted   ??? Aztreonam Anaphylaxis, Hives, Nausea And Vomiting, and Rash 09/06/2015   ??? [place] [aztreonam lysine] Anaphylaxis 12/27/2016   ??? Cefepime Itching, Nausea Only, and Other (See Comments) 09/06/2015   ??? Other Anaphylaxis and Other (See Comments) 09/06/2015   ??? Slo-bid 100 Anaphylaxis 06/20/2017   ??? Tobramycin Tinnitus 09/06/2015   ??? Banana Itching and Nausea And Vomiting 07/29/2016     Genetics:  S945L and [id number] insertion  ??  Airway Clearance Regimen:  HS BID  Pulmozyme  ??  Inhaled ABX:  TOBI nebs every other month   ??  Hemoptysis:   No  ABPA:             No  Ptx:                  No  Sinusitus:       Yes  ??  Panc Insuf:     Yes  PEG:                No  DIOS:              No bowel issues  CF Liver Dz:   No  ??  Diabetes:        Uncontrolled - follows w/ endocrine  Osteopenia:   N/A  ??  Depression:   Yes  ??  Other Co-morbidities:  N/A  ??  Social Setting:  Lives with wife near [place]    Micro History:  ??  MABSC  - Assumed S to erythro from [facility]  06/2017: MASBC not present, but M gordonae is   ??  MSSA (09/29/19)     Smooth PsA  S: Cipro, Levo, Tobra  I: Aztreo, Cefe, Ceftaz, PT, LVX  ??  Stenotrophamonas  R: Ceftat  ??  MABSC (macrolide sensitive)  Last Negative Cx: 03/2017  Last Positive: 02/21/17  ????  Review of Systems:  A comprehensive review of systems was completed and negative except as noted in HPI.    PHYSICAL EXAM:     There were no vitals filed for this visit.  There is no height or weight on file to calculate BMI.  Wt Readings from Last 3 Encounters:   06/28/20 (!) 105.8 kg (233 lb 4.8 oz)   01/04/20 (!) 110.7 kg (244 lb)   09/29/19 (!) 113 kg (249 lb 1.9 oz)     General: Patient laying on his couch, awake and alert responding appropriately    Other exam not possible given video visit.  LABORATORY and RADIOLOGY DATA:     Pulmonary Function Tests:          Pertinent Laboratory Data:  CF Sputum Culture   Date Value Ref Range Status   07/01/2020 <1+ Smooth Pseudomonas aeruginosa (A)  Final   07/01/2020 <1+ Mucoid Pseudomonas aeruginosa (A)  Final   07/01/2020 1+ Oropharyngeal Flora Isolated  Final        AFB Smear   Date Value Ref Range Status   07/01/2020  No Acid Fast Bacilli Seen Final    NO ACID FAST BACILLI SEEN- 3 negative smears do not exclude pulmonary TB. If active pulmonary TB is suspected, continue airborne isolation until pulmonary disease is excluded by negative cultures.     AFB Culture   Date Value Ref Range Status   07/01/2020 No Acid Fast Bacilli Detected  Final        IgE, Total   Date Value Ref Range Status   01/18/2020 20 6 - 495 IU/mL Final        Total Bilirubin   Date Value Ref Range Status   07/04/2020 0.5 0.3 - 1.2 mg/dL Final     AST   Date Value Ref Range Status   07/04/2020 25 <=34 U/L Final     ALT   Date Value Ref Range Status   07/04/2020 33 10 - 49 U/L Final     Alkaline Phosphatase   Date Value Ref Range Status   07/04/2020 61 46 - 116 U/L Final        INR   Date Value Ref Range Status   06/28/2020 1.78  Final        Hemoglobin A1c   Date Value Ref Range Status   01/18/2020 9.4 (H) 4.8 - 5.6 % Final     Comment:              Prediabetes: 5.7 - 6.4           Diabetes: >6.4           Glycemic control for adults with diabetes: <7.0       Estimated Average Glucose   Date Value Ref Range Status   09/29/2019 206 mg/dL Final        Vitamin A   Date Value Ref Range Status   01/18/2020 42.4 18.9 - 57.3 ug/dL Final     Comment:     Reference intervals for vitamin A determined from [facility] internal  studies. Individuals with vitamin A less than 20 ug/dL are considered  vitamin A deficient and those with serum concentrations less than  10 ug/dL are considered severely deficient.  This test was developed and its performance characteristics  determined by [facility]. It has not been cleared or approved  by the Food and Drug Administration.       Vitamin D Total (25OH)   Date Value Ref Range Status   03/19/2017 27.9 20.0 - 80.0 ng/mL Final        The patient reports they are currently: at home. I spent 25 minutes on the real-time audio and video with the patient on the date of service. I spent an additional 10 minutes on pre- and post-visit activities on the date of service.     The patient was physically located in [place] or a state in which I am permitted to provide care. The patient and/or parent/guardian understood that s/he may incur co-pays and cost sharing, and agreed to the telemedicine visit. The visit was reasonable and appropriate  under the circumstances given the patient's presentation at the time.    The patient and/or parent/guardian has been advised of the potential risks and limitations of this mode of treatment (including, but not limited to, the absence of in-person examination) and has agreed to be treated using telemedicine. The patient's/patient's family's questions regarding telemedicine have been answered.     If the visit was completed in an ambulatory setting, the patient and/or parent/guardian has also been advised to contact their provider???s office for worsening conditions, and seek emergency medical treatment and/or call 911 if the patient deems either necessary.        Portions of this record have been created using Scientist, clinical (histocompatibility and immunogenetics). Dictation errors have been sought, but may not have been identified and corrected.    I am located on-site and the patient is located off-site for this visit.      Darnelle Bos, MD  10/03/20  12:42 PM              I am located on-site and the patient is located off-site for this visit.

## 2020-10-06 NOTE — Unmapped (Signed)
Nutrition - Brief:    Spoke with Christian Young via telephone on day of virtual visit. He endorses resolution of GI symptoms following recent SBO. He is having 1-2 BMs daily with consistent use of miralax.     He has lost a significant amount of weight recently (~15 lbs) although it was intentional. He reports listening to his body, not eating if he does not feel hungry which is a big change from previous. He used to eat several large meals each day and now sometimes waits until evening to have his first meal. Discussed the importance of consuming regular meals to ensure he is meeting nutrient needs and the impacts of prolonged meal skipping (ie slowed metabolism).     Encouraged Christian Young to continue bowel regimen, enzyme adherence and water consumption to facilitate BMs and hopefully prevent further episodes of DIOS. Additional information to be sent via MyChart.     Time Spent: 6 minutes, no charge    Jackqulyn Livings MPH, RD, LDN  Pager: [phone]

## 2020-10-12 NOTE — Unmapped (Signed)
[facility] Shared [facility] Clinical Assessment & Refill Coordination Note    Christian Young, Hiko: 05-15-1986  Phone: [phone] (home)     All above HIPAA information was verified with patient.     Was a Nurse, learning disability used for this call? No    Specialty Medication(s):   CF/Pulmonary: -Trikafta     Current Outpatient Medications   Medication Sig Dispense Refill   ??? albuterol (PROVENTIL HFA;VENTOLIN HFA) 90 mcg/actuation inhaler Inhale 2 puffs every six (6) hours as needed. 1 Inhaler 1   ??? albuterol 2.5 mg/0.5 mL nebulizer solution Inhale 0.5 mL (2.5 mg total) by nebulization Two (2) times a day. 180 each 3   ??? amitriptyline (ELAVIL) 100 MG tablet TAKE ONE TABLET BY MOUTH AT BEDTIME 30 tablet 1   ??? atorvastatin (LIPITOR) 20 MG tablet Take 20 mg by mouth nightly.     ??? BETHKIS 300 mg/4 mL Nebu Inhale 1 vial via nebulization every 12 hours for 28 days, alternating every other 28 days 224 mL 6   ??? blood sugar diagnostic (GLUCOSE BLOOD) Strp by Other route Three (3) times a day with a meal. Rx sent to [facility] 01/04/20     ??? blood-glucose meter kit Dispense meter that is preferred by patient's insurance company 1 each 0   ??? blood-glucose meter,continuous (DEXCOM G6 RECEIVER) Misc Use as directed 1 each 0   ??? budesonide-formoteroL (SYMBICORT) 160-4.5 mcg/actuation inhaler Inhale 2 puffs Two (2) times a day. 30.6 g 3   ??? cetirizine (ZYRTEC) 10 MG tablet TAKE ONE TABLET BY MOUTH AT LUNCH 90 tablet 3   ??? CREON 36,000-114,000- 180,000 unit CpDR TAKE 8 CAPSULES BY MOUTH 3 TIMES DAILY WITH MEALS AND TAKE 4 CAPSULES TWICE DAILY WITH SNACKS *MAY TAKE UP TO 1 EXTRA SNACK DAILY. MAX 36 PER DAY* 1200 capsule 11   ??? DEXCOM G6 SENSOR Devi Use sq as directed every 14 days 6 each 11   ??? DEXCOM G6 TRANSMITTER Devi Use as directed 1 each 11   ??? dornase alfa (PULMOZYME) 1 mg/mL nebulizer solution Inhale 2.5 mg Two (2) times a day. 450 mL 3   ??? EASY TOUCH LANCING DEVICE Misc Use as directed.     ??? EASY TOUCH TWIST LANCETS 30 gauge Misc Use as directed.     ??? elexacaftor-tezacaftor-ivacaft (TRIKAFTA) 100-50-75 mg(d) /150 mg (n) tablet Take 2 Tablets (orange) by mouth in the morning and 1 tablet (blue) in the evening with fatty food 84 tablet 3   ??? famotidine (PEPCID) 20 MG tablet Take 1 tablet (20 mg total) by mouth daily. 30 tablet 5   ??? FLUoxetine 60 mg Tab Take 60 mg by mouth daily.      ??? fluticasone propionate (FLONASE) 50 mcg/actuation nasal spray USE 1 SPRAY IN EACH NOSTRIL EVERY DAY 16 g 11   ??? gabapentin 600 mg Tb24 Take 600 mg by mouth daily.     ??? insulin ASPART (NOVOLOG FLEXPEN) 100 unit/mL (3 mL) injection pen Use up to 150 units/day, divided 3 TIMES DAILY BEFORE MEALS 45 mL 0   ??? insulin glargine (BASAGLAR, LANTUS) 100 unit/mL (3 mL) injection pen Inject 0.7 mL (70 Units total) under the skin daily. (Patient taking differently: Inject 50 Units under the skin nightly. ) 30 mL 0   ??? lamoTRIgine (LAMICTAL) 200 MG tablet Take 200 mg by mouth daily.     ??? lisinopriL (PRINIVIL,ZESTRIL) 10 MG tablet Take 1 tablet (10 mg total) by mouth daily. 30 tablet  0   ??? melatonin 10 mg Tab Take 10 mg by mouth nightly.     ??? montelukast (SINGULAIR) 10 mg tablet TAKE ONE TABLET BY MOUTH AT BEDTIME 90 tablet 3   ??? mupirocin (BACTROBAN) 2 % ointment Apply inside of both nostrils and to other affected areas twice daily. 22 g 0   ??? MVW COMPLETE FORMUL PROBIOTIC 40 billion cell -15 mg CpDR Take 1 capsule by mouth daily. (Patient taking differently: Take 1 capsule by mouth Two (2) times a day. ) 90 capsule 3   ??? MVW Complete, pediatric multivit 61-D3-vit K, (MVW COMPLETE FORMULATION) 1,500-800 unit-mcg cap Take 1 capsule by mouth Two (2) times a day.     ??? nebulizers Misc Use as directed with inhaled medications 4 each 3   ??? omeprazole (PRILOSEC) 20 MG capsule Take 20 mg by mouth daily.     ??? pen needle, diabetic (BD ULTRA-FINE NANO PEN NEEDLE) 32 gauge x 5/32 (4 mm) Ndle use up to 4 times daily 400 each 12   ??? pramipexole (MIRAPEX) 0.125 MG tablet Take 0.25 mg by mouth every evening.      ??? sodium chloride 7% 7 % Nebu Inhale 4 mL by nebulization Two (2) times a day. Increase to 4 times while taking antibiotics     ??? traZODone (DESYREL) 100 MG tablet Take 1 tablet (100 mg total) by mouth nightly. 30 tablet 0   ??? XARELTO 20 mg tablet Take 20 mg by mouth daily.       No current facility-administered medications for this visit.        Changes to medications: Christian Young reports starting the following medications: Hydorcodone/APAP for back pain    Allergies   Allergen Reactions   ??? Aztreonam Anaphylaxis, Hives, Nausea And Vomiting and Rash     fevers  Patient stated that he only vomited x1 with [place] in the past.????    fevers  Patient stated that he only vomited x1 with [place] in the past.????   ??? [place] [Aztreonam Lysine] Anaphylaxis   ??? Cefepime Itching, Nausea Only and Other (See Comments)     Headaches also   ??? Other Anaphylaxis and Other (See Comments)     Other reaction(s): Other (See Comments)  Bananas: itchy throat  Slo Bid record from [employer] states anaphylaxis.????Pt states this was from childhood and does not know reaction.  Bananas, causes itchy throat   ??? Slo-Bid 100 Anaphylaxis   ??? Tobramycin Tinnitus     From OSH record-documented as tinnitus but has received IV tobra with close monitoring.  Pt has received Tobramycin at [facility] since this allergy documented    From OSH record-documented as tinnitus but has received IV tobra with close monitoring.   ??? Banana Itching and Nausea And Vomiting       Changes to allergies: No    SPECIALTY MEDICATION ADHERENCE     Trikafta 100 mg: 7 days of medicine on hand       Medication Adherence    Patient reported X missed doses in the last month: 0  Specialty Medication: Trikafta 100mg   Patient is on additional specialty medications: No  Informant: patient  Support network for adherence: family member          Specialty medication(s) dose(s) confirmed: Regimen is correct and unchanged.     Are there any concerns with adherence? No    Adherence counseling provided? Not needed    CLINICAL MANAGEMENT AND INTERVENTION      Clinical  Benefit Assessment:    Do you feel the medicine is effective or helping your condition? Yes    Clinical Benefit counseling provided? Progress note from 11/29 shows evidence of clinical benefit    Adverse Effects Assessment:    Are you experiencing any side effects? No    Are you experiencing difficulty administering your medicine? No    Quality of Life Assessment:    How many days over the past month did your CF  keep you from your normal activities? For example, brushing your teeth or getting up in the morning. 0    Have you discussed this with your provider? Not needed    Therapy Appropriateness:    Is therapy appropriate? Yes, therapy is appropriate and should be continued    DISEASE/MEDICATION-SPECIFIC INFORMATION      For CF patients: CF [employer] [facility] Active? Yes    PATIENT SPECIFIC NEEDS     - Does the patient have any physical, cognitive, or cultural barriers? No    - Is the patient high risk? No    - Does the patient require a Care Management Plan? No     - Does the patient require physician intervention or other additional services (i.e. nutrition, smoking cessation, social work)? No      SHIPPING     Specialty Medication(s) to be Shipped:   CF/Pulmonary: -Trikafta    Other medication(s) to be shipped: No additional medications requested for fill at this time     Changes to insurance: No    Delivery Scheduled: Yes, Expected medication delivery date: 3/4.     Medication will be delivered via UPS to the confirmed prescription address in [facility].    The patient will receive a drug information handout for each medication shipped and additional FDA Medication Guides as required.  Verified that patient has previously received a Conservation officer, historic buildings.    All of the patient's questions and concerns have been addressed.    Julianne Rice   [facility] Specialty Pharmacist

## 2020-10-13 MED FILL — TRIKAFTA 100-50-75 MG (D)/150 MG (N) TABLETS: 28 days supply | Qty: 84 | Fill #3

## 2020-11-04 NOTE — Unmapped (Signed)
Spoke with Mr Stauber    He is undergoing a Lumbar epidural steroid injection on 3/29. He was asked to hold his xarelto for 3 days prior to procedure    I discussed with him risks and benefits of that. He has had 2 DVTs, in 2016, one provoked and one non provoked, and was supposed to be on this medication lifelong    He understands the risks of being off the drug for a short time, but also understands the benefits of getting the shot and being able to move better.     He is agreeable with the risks of holding this drug.    I have asked him to tell us the same day of his procedure as to when are they ok to restart it after the [facility] is done.    Fax filled and sent and delivery receipt received.      Maia Plan, MD  PGY 6, Pulmonary and Critical Care  Pager: [phone]  November 04, 2020 10:25 AM

## 2020-11-08 LAB — HEPATIC FUNCTION PANEL
ALKALINE PHOSPHATASE: 91 U/L
ALT (SGPT): 37 U/L
AST (SGOT): 29 U/L
BILIRUBIN TOTAL: 0.4 mg/dL
PROTEIN TOTAL: 7.8 g/dL

## 2020-11-08 MED ORDER — TRIKAFTA 100-50-75 MG (D)/150 MG (N) TABLETS
ORAL_TABLET | 6 refills | 0 days | Status: CP
Start: 2020-11-08 — End: ?
  Filled 2020-11-09: qty 84, 28d supply, fill #0

## 2020-11-08 NOTE — Unmapped (Signed)
[facility] Refill Coordination Note    Specialty Medication(s) to be Shipped:   CF/Pulmonary: -Trikafta  Other medication(s) to be shipped: No additional medications requested for fill at this time     Christian Young, DOB: [birth year]  Phone: [phone] (home)     All above HIPAA information was verified with patient.     Was a Nurse, learning disability used for this call? No    Completed refill call assessment today to schedule patient's medication shipment from the [facility] [phone]).       Specialty medication(s) and dose(s) confirmed: Regimen is correct and unchanged.   Changes to medications: Yogesh reports no changes at this time.  Changes to insurance: No  Questions for the pharmacist: No    Confirmed patient received a Conservation officer, historic buildings and a Surveyor, mining with first shipment. The patient will receive a drug information handout for each medication shipped and additional FDA Medication Guides as required.       DISEASE/MEDICATION-SPECIFIC INFORMATION        For CF patients: CF [employer] Active? Yes, **[facility] VST til 05/06/21 ([place]: Swept, but no need to email [facility])**    SPECIALTY MEDICATION ADHERENCE     Medication Adherence    Patient reported X missed doses in the last month: 0  Specialty Medication: Trikafta  Patient is on additional specialty medications: No  Patient is on more than two specialty medications: No  Informant: patient  Reliability of informant: reliable  Reasons for non-adherence: no problems identified  Support network for adherence: family member        Trikafta: 7 days of medicine on hand     SHIPPING     Shipping address confirmed in Epic.     Delivery Scheduled: Yes, Expected medication delivery date: 11/10/2020.     Medication will be delivered via UPS to the prescription address in Epic [facility].    Christian Young Shared [facility] Specialty Technician

## 2020-11-09 NOTE — Unmapped (Signed)
Adult CF Clinic Pharmacist Note: CFTR Modulator Lab Monitoring     Andrews [facility]ener on [address].  Start date/month: 09/2019    Latest LFTs done: 08/27/2020 (at OSH. Results found in media tab and entered as external labs)  AST   Date Value Ref Range Status   08/27/2020 29 U/L Final   07/04/2020 25 <=34 U/L Final   06/28/2020 23 <=34 U/L Final     ALT   Date Value Ref Range Status   08/27/2020 37 U/L Final   07/04/2020 33 10 - 49 U/L Final   06/28/2020 33 10 - 49 U/L Final     Alkaline Phosphatase   Date Value Ref Range Status   08/27/2020 91 U/L Final   07/04/2020 61 46 - 116 U/L Final   06/28/2020 90 46 - 116 U/L Final     Total Bilirubin   Date Value Ref Range Status   08/27/2020 0.4 mg/dL Final   16/05/9603 0.5 0.3 - 1.2 mg/dL Final   54/04/8118 0.6 0.3 - 1.2 mg/dL Final     Assessment/Plan:  ?? AST WNL. ALT WNL  ?? Tbili WNL  ??? Continue CFTR modulator therapy.  ??? Next LFTs due in 1 year: 08/2021.  ??? Trikafta prescription renewed.    Barbette Hair, PharmD, MPH, BCPS, CPP  Clinical Pharmacist Practitioner  [facility]  [phone]

## 2020-11-15 MED ORDER — INSULIN ASPART (U-100) 100 UNIT/ML (3 ML) SUBCUTANEOUS PEN
0 refills | 0.00000 days
Start: 2020-11-15 — End: ?

## 2020-11-22 NOTE — Unmapped (Signed)
Gaylord wrote to this CF nurse coordinator through MyChart:    I am reaching out to say that I am feeling very much under the weather, kinda dizzy and unstable. I have been around sick people, my sister in law and my wife has a deep cough. So I don???t know what to do really     Spoke with Tyshaun about his message via MyChart.     Date of symptom onset: went on a mini vacation to [facility].  Felt fine before the trip, however when there his sister-in-law has some sort of stomach bug/food poisoning. COVID was negative for SIL.  When back at home, his wife started to get congested and had deep nasty cough. For Braeton,  his systmpoms started about two days ago with nasal congestion and sore throat.  Yesterday congestion went away, and his fear is that it has moved into his chest. Savas's home COVID test came back negative. Cadarius also notes dizziness and wobbliness all day that started also about two days ago.  Report that it he takes dramamine, it just puts him to sleep. Lydell feels like this vertigo-like sensation is typical when he is having a CF flare.     General Symptoms:  Fever:     yes 100.48F yesterday    Chills:     yes (body moves between feeling cold and hot)   Nightsweats:    yes (last two-three days--significant enough that he had to change the sheets one night)   Change in appetite:   no   Change in Energy Level:  yes, taking a lot of naps     Glucoses: report they have been high (could be related to corticosteroid injection?)     Pulmonary Symptoms:    Increased cough:   coughing every once in a while, not too bad, but more than baseline    Cough Mild or Significant:  mild   Coughing at Night?    no   Cough:     dry cough, nothing to the point of it hurts (throat and lungs) --feels like lungs have a stomach ache   Sputum Description     none    Hemoptysis     no   Chest pain    both side of chest, normal chest pain r/t to flares   Chest tightness   yes, trouble to take a deep breath Wheezing:     no   Short of breath with activity:     yes, noticeable with walking his dog (breathing heavy--turned around early)     Sinus Symptoms:  Not really having any sinus symptoms. Sneezes a couple of times, however, taking two allergy medications (Singulair and Zyrtec)  Using Flonase--twice a day   Reports blowing his nose a lot--light green color to it   Sore throat in the morning, goes away during the day.   Only uses sinus irrigations when super congested and can't smell food (not there yet).     Neb Treatments:   Twice daily:  Albuterol   Hypertonic saline saline   Pulmozyme  Symbicort     Tobramycin? Has some in the refrigerator unsure if or when to use.      Airway Clearance: Vest-Monarch (twice a day, sometimes more)       Preferred Pharmacy:   [facility] Spirometer:    Completed yesterday (11/20/2020)       Will route to Dr [name] for advice.  [name], RN  CF Nurse Coordinator   [phone]

## 2020-11-23 NOTE — Unmapped (Signed)
Admission scheduled: Pending discussion with MAO.  Patient will arrive by pending am/pm.    Accepting Physician: Pending MAO discussion    Primary Pulmonary Physician: Dr Maia Plan    Reason for Admission: Acute exacerbation of CF bronchiectasis    Antibiotics: Based on prior culture data (MSSA, 2 variants of Pseudomonas) and allergy profile, favor initiating treating with Zosyn +Tobramycin. Please do MRSA screen/culture.  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 Careful monitoring of tobramycin levels and BMP.   Anticipate atleast 14 days of abx. He will need an access line to go home with for completion of Abx.      Other medications: Continue Trikafta, pulmozyme, airway clearance with 3% HTS preceded by albuterol 3 times a day, chest vest and aerobika.    Airway clearance: As above    Consults: Pulmonary    Labs: CBC CMP CRP ESR Tobramycin levels    Imaging:  CXR    Other Testing: Sputum culture, Spirometry prior to discharge    Dispo: Patient may need to remain in the hospital for the entire course of IV antibiotics. Patient does  need PFTs performed while in the hospital. Patient will follow-up appointment with Dr [name].      Maia Plan, MD  PGY 6 Pulmonary and Critical Care  Pager: [phone]  November 23, 2020 10:52 AM

## 2020-11-24 ENCOUNTER — Ambulatory Visit
Admit: 2020-11-24 | Discharge: 2020-12-09 | Disposition: A | Discharge status: Home / Self Care | Payer: PRIVATE HEALTH INSURANCE

## 2020-11-24 LAB — COMPREHENSIVE METABOLIC PANEL
ALBUMIN: 3.9 g/dL (ref 3.4–5.0)
ALKALINE PHOSPHATASE: 77 U/L (ref 46–116)
ALT (SGPT): 39 U/L (ref 10–49)
ANION GAP: 10 mmol/L (ref 5–14)
AST (SGOT): 26 U/L (ref <=34)
BILIRUBIN TOTAL: 0.4 mg/dL (ref 0.3–1.2)
BLOOD UREA NITROGEN: 16 mg/dL (ref 9–23)
BUN / CREAT RATIO: 26
CALCIUM: 9.5 mg/dL (ref 8.7–10.4)
CHLORIDE: 98 mmol/L (ref 98–107)
CO2: 24.6 mmol/L (ref 20.0–31.0)
CREATININE: 0.61 mg/dL
EGFR CKD-EPI AA MALE: >90 mL/min/{1.73_m2} (ref >=60)
EGFR CKD-EPI NON-AA MALE: >90 mL/min/{1.73_m2} (ref >=60)
GLUCOSE RANDOM: 362 mg/dL — ABNORMAL HIGH (ref 70–179)
POTASSIUM: 4.2 mmol/L (ref 3.4–4.8)
PROTEIN TOTAL: 7.4 g/dL (ref 5.7–8.2)
SODIUM: 133 mmol/L — ABNORMAL LOW (ref 135–145)

## 2020-11-24 LAB — CBC W/ AUTO DIFF
BASOPHILS ABSOLUTE COUNT: 0.1 10*9/L (ref 0.0–0.1)
BASOPHILS RELATIVE PERCENT: 1.4 %
EOSINOPHILS ABSOLUTE COUNT: 0.1 10*9/L (ref 0.0–0.5)
EOSINOPHILS RELATIVE PERCENT: 1.6 %
HEMATOCRIT: 41.4 % (ref 39.0–48.0)
HEMOGLOBIN: 14.4 g/dL (ref 12.9–16.5)
LYMPHOCYTES ABSOLUTE COUNT: 1.8 10*9/L (ref 1.1–3.6)
LYMPHOCYTES RELATIVE PERCENT: 23.2 %
MEAN CORPUSCULAR HEMOGLOBIN CONC: 34.8 g/dL (ref 32.0–36.0)
MEAN CORPUSCULAR HEMOGLOBIN: 29.3 pg (ref 25.9–32.4)
MEAN CORPUSCULAR VOLUME: 84.1 fL (ref 77.6–95.7)
MEAN PLATELET VOLUME: 8 fL (ref 6.8–10.7)
MONOCYTES ABSOLUTE COUNT: 0.5 10*9/L (ref 0.3–0.8)
MONOCYTES RELATIVE PERCENT: 7 %
NEUTROPHILS ABSOLUTE COUNT: 5.1 10*9/L (ref 1.8–7.8)
NEUTROPHILS RELATIVE PERCENT: 66.8 %
NUCLEATED RED BLOOD CELLS: 0 /100{WBCs} (ref <=4)
PLATELET COUNT: 331 10*9/L (ref 150–450)
RED BLOOD CELL COUNT: 4.92 10*12/L (ref 4.26–5.60)
RED CELL DISTRIBUTION WIDTH: 14.4 % (ref 12.2–15.2)
WBC ADJUSTED: 7.6 10*9/L (ref 3.6–11.2)

## 2020-11-24 LAB — C-REACTIVE PROTEIN: C-REACTIVE PROTEIN: <4 mg/L (ref <=10.0)

## 2020-11-24 LAB — HIGH SENSITIVITY TROPONIN I - SINGLE: HIGH SENSITIVITY TROPONIN I: <3 ng/L (ref <=53)

## 2020-11-24 LAB — SEDIMENTATION RATE: ERYTHROCYTE SEDIMENTATION RATE: 10 mm/h (ref 0–15)

## 2020-11-24 LAB — TOBRAMYCIN LEVEL, RANDOM: TOBRAMYCIN RANDOM: <0.3 ug/mL

## 2020-11-24 MED ADMIN — piperacillin-tazobactam (ZOSYN) IVPB (premix) 4.5 g: 4.5 g | INTRAVENOUS | @ 20:00:00 | Stop: 2020-11-24

## 2020-11-24 MED ADMIN — MORPhine 4 mg/mL injection 4 mg: 4 mg | INTRAVENOUS | @ 20:00:00 | Stop: 2020-11-24

## 2020-11-24 NOTE — Unmapped (Signed)
Pt reports having chest pain that began yesterday. Hx of CF, c/o SOB, dry cough. sts he has this type of pain when he has a CF flare up.

## 2020-11-24 NOTE — Unmapped (Signed)
[facility]  Emergency Department Provider Note      ED Clinical Impression      Final diagnoses:   Chest pain, unspecified type (Primary)   Cough   Shortness of breath       ED Course, Assessment and Plan     Initial Clinical Impression:    November 24, 2020 2:56 PM   [name] is a 35 y.o. 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 On exam, Vital signs stable.  Overall chronically ill appearing. Normal cardio exam. Lung sounds coarse throughout. Nasal congestion noted. Normal abdominal exam.  Normal neurologic exam.     BP 119/77  - Pulse 86  - Temp 36.9 ??C (98.4 ??F) (Oral)  - Resp 18  - Ht 182.9 cm (6')  - Wt (!) 103 kg (227 lb)  - SpO2 96%  - BMI 30.79 kg/m??     Differential diagnosis includes CF exacerbation vs pneumonia versus COVID-19, less likely ACS, pneumothorax    Will obtain EKG, CXR, CBC, CMP, hsTroponin, CRP, Sed Rate, blood cultures, Tobramycin, and Rapid Influenza/RSV/COVID PCR. Will treat patient with IV Nebcin, IV Zosyn, and morphine for pain management.    Clinical presentation concerning for cystic fibrosis exacerbation.  Patient does have significant right-sided chest pain with associated fever, shortness of breath, and nonproductive cough.  Patient states this feels similar to his typical CF flares, however he typically has productive cough.  COVID-19 PCR is negative.  Labs otherwise reassuring.  Chest x-ray shows no focal consolidations.  According to patient's pulmonologist's note yesterday, they recommend admission with coverage with tobramycin and Zosyn for likely CF exacerbation.  Followint their recommendations, will admit patient for close monitoring and IV antibiotics.          16:28  CBC is unremarkable. CMP shows elevated glucose to 362. Troponin negative to <3. Sed rate and CRP within normal limits. Negative Influenza/RSV/COVID test. Negative CXR.    _____________________________________________________________________    The case was discussed with attending physician who is in agreement with the above assessment and plan    Dictation software was used while making this note. Please excuse any errors made with dictation software.    Additional Medical Decision Making     I have reviewed the vital signs and the nursing notes. Labs and radiology results that were available during my care of the patient were independently reviewed by me and considered in my medical decision making.     I independently visualized the EKG tracing if performed  I independently visualized the radiology images if performed  I reviewed the patient's prior medical records if available.  Additional history obtained from family if available    History     CHIEF COMPLAINT:   Chief Complaint   Patient presents with   ??? Chest Pain       HPI: Christian Young is a 35 y.o. 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 Patient reports 6 days of progressively worsening non-productive  cough with associated chest pain after taking a recent trip to [facility]. Patient's chest pain is not worsened with movement. He additionally endorses subjective fever (with Tmax 100.1 F). Normal BM. Per chart review, [facility] advised patient to present to ED today for admission for IV antibiotics 2/2 acute exacerbation of CF bronchiectasis. Further, patient denies chills, rhinorrhea, nausea, vomiting, abdominal pain, changes in bowel movements, bilateral lower extremity swelling, or urinary symptoms.     PAST MEDICAL HISTORY/PAST SURGICAL HISTORY:   Past Medical History:   Diagnosis Date   ??? Anxiety    ??? Chronic pain disorder    ??? Cystic fibrosis (CMS-HCC)    ??? Depression    ??? Hypertension    ??? Nonproductive cough 04/05/2018       Past Surgical History:   Procedure Laterality Date   ??? IR INSERT PORT AGE GREATER THAN 5 YRS  03/27/2019    IR INSERT PORT AGE GREATER THAN 5 YRS 03/27/2019 Rush Barer, MD IMG [facility]   ??? PR REMOVAL OF LUNG,LOBECTOMY Right 03/29/2017    Procedure: REMOVAL OF LUNG, OTHER THAN PNEUMONECTOMY; SINGLE LOBE (LOBECTOMY);  Surgeon: Cherie Dark, MD;  Location: MAIN OR [facility];  Service: Thoracic       MEDICATIONS:     Current Facility-Administered Medications:   ???  tobramycin (NEBCIN) 260 mg in sodium chloride (NS) 0.9 % 100 mL IVPB, 260 mg, Intravenous, Q12H **AND** Inpatient consult to Pharmacy RX to dose: tobramycin, , , Once, Aine Strycharz Deberah Castle, MD    Current Outpatient Medications:   ???  albuterol (PROVENTIL HFA;VENTOLIN HFA) 90 mcg/actuation inhaler, Inhale 2 puffs every six (6) hours as needed., Disp: 1 Inhaler, Rfl: 1  ???  albuterol 2.5 mg/0.5 mL nebulizer solution, Inhale 0.5 mL (2.5 mg total) by nebulization Two (2) times a day., Disp: 180 each, Rfl: 3  ???  amitriptyline (ELAVIL) 100 MG tablet, TAKE ONE TABLET BY MOUTH AT BEDTIME, Disp: 30 tablet, Rfl: 1  ???  atorvastatin (LIPITOR) 20 MG tablet, Take 20 mg by mouth nightly., Disp: , Rfl:   ???  BETHKIS 300 mg/4 mL Nebu, Inhale 1 vial via nebulization every 12 hours for 28 days, alternating every other 28 days, Disp: 224 mL, Rfl: 6  ???  blood sugar diagnostic (GLUCOSE BLOOD) Strp, by Other route Three (3) times a day with a meal. Rx sent to [facility] 01/04/20, Disp: , Rfl:   ???  blood-glucose meter kit, Dispense meter that is preferred by [employer], Disp: 1 each, Rfl: 0  ???  blood-glucose meter,continuous (DEXCOM G6 RECEIVER) Misc, Use as directed, Disp: 1 each, Rfl: 0  ???  budesonide-formoteroL (SYMBICORT) 160-4.5 mcg/actuation inhaler, Inhale 2 puffs Two (2) times a day., Disp: 30.6 g, Rfl: 3  ???  cetirizine (ZYRTEC) 10 MG tablet, TAKE ONE TABLET BY MOUTH AT LUNCH, Disp: 90 tablet, Rfl: 3  ???  CREON 36,000-114,000- 180,000 unit CpDR, TAKE 8 CAPSULES BY MOUTH 3 TIMES DAILY WITH MEALS AND TAKE 4 CAPSULES TWICE DAILY WITH SNACKS *MAY TAKE UP TO 1 EXTRA SNACK DAILY. MAX 36 PER DAY*, Disp: 1200 capsule, Rfl: 11  ???  DEXCOM G6 SENSOR Devi, Use sq as directed every 14 days, Disp: 6 each, Rfl: 11  ???  DEXCOM G6 TRANSMITTER Devi, Use as directed, Disp: 1 each, Rfl: 11  ???  dornase alfa (PULMOZYME) 1 mg/mL nebulizer solution, Inhale 2.5 mg Two (2) times a day., Disp: 450 mL, Rfl: 3  ???  EASY TOUCH LANCING  DEVICE Misc, Use as directed., Disp: , Rfl:   ???  EASY TOUCH TWIST LANCETS 30 gauge Misc, Use as directed., Disp: , Rfl:   ???  elexacaftor-tezacaftor-ivacaft (TRIKAFTA) 100-50-75 mg(d) /150 mg (n) tablet, Take 2 Tablets (orange) by mouth in the morning and 1 tablet (blue) in the evening with fatty food, Disp: 84 tablet, Rfl: 6  ???  famotidine (PEPCID) 20 MG tablet, Take 1 tablet (20 mg total) by mouth daily., Disp: 30 tablet, Rfl: 5  ???  FLUoxetine 60 mg Tab, Take 60 mg by mouth daily. , Disp: , Rfl:   ???  fluticasone propionate (FLONASE) 50 mcg/actuation nasal spray, USE 1 SPRAY IN EACH NOSTRIL EVERY DAY, Disp: 16 g, Rfl: 11  ???  gabapentin 600 mg Tb24, Take 600 mg by mouth daily., Disp: , Rfl:   ???  HYDROcodone-acetaminophen (NORCO) 5-325 mg per tablet, Take 1 tablet by mouth every six (6) hours as needed for pain. Usually BID for back pain, Disp: , Rfl:   ???  insulin ASPART (NOVOLOG FLEXPEN) 100 unit/mL (3 mL) injection pen, Use up to 150 units/day, divided 3 TIMES DAILY BEFORE MEALS, Disp: 45 mL, Rfl: 0  ???  insulin glargine (BASAGLAR, LANTUS) 100 unit/mL (3 mL) injection pen, Inject 0.7 mL (70 Units total) under the skin daily. (Patient taking differently: Inject 50 Units under the skin nightly. ), Disp: 30 mL, Rfl: 0  ???  lamoTRIgine (LAMICTAL) 200 MG tablet, Take 200 mg by mouth daily., Disp: , Rfl:   ???  lisinopriL (PRINIVIL,ZESTRIL) 10 MG tablet, Take 1 tablet (10 mg total) by mouth daily., Disp: 30 tablet, Rfl: 0  ???  melatonin 10 mg Tab, Take 10 mg by mouth nightly., Disp: , Rfl:   ???  montelukast (SINGULAIR) 10 mg tablet, TAKE ONE TABLET BY MOUTH AT BEDTIME, Disp: 90 tablet, Rfl: 3  ???  mupirocin (BACTROBAN) 2 % ointment, Apply inside of both nostrils and to other affected areas twice daily., Disp: 22 g, Rfl: 0  ???  MVW COMPLETE FORMUL PROBIOTIC 40 billion cell -15 mg CpDR, Take 1 capsule by mouth daily. (Patient taking differently: Take 1 capsule by mouth Two (2) times a day. ), Disp: 90 capsule, Rfl: 3  ???  MVW Complete, pediatric multivit 61-D3-vit K, (MVW COMPLETE FORMULATION) 1,500-800 unit-mcg cap, Take 1 capsule by mouth Two (2) times a day., Disp: , Rfl:   ???  nebulizers Misc, Use as directed with inhaled medications, Disp: 4 each, Rfl: 3  ???  omeprazole (PRILOSEC) 20 MG capsule, Take 20 mg by mouth daily., Disp: , Rfl:   ???  pen needle, diabetic (BD ULTRA-FINE NANO PEN NEEDLE) 32 gauge x 5/32 (4 mm) Ndle, use up to 4 times daily, Disp: 400 each, Rfl: 12  ???  pramipexole (MIRAPEX) 0.125 MG tablet, Take 0.25 mg by mouth every evening. , Disp: , Rfl:   ???  sodium chloride 7% 7 % Nebu, Inhale 4 mL by nebulization Two (2) times a day. Increase to 4 times while taking antibiotics, Disp: , Rfl:   ???  traZODone (DESYREL) 100 MG tablet, Take 1 tablet (100 mg total) by mouth nightly., Disp: 30 tablet, Rfl: 0  ???  XARELTO 20 mg tablet, Take 20 mg by mouth daily., Disp: , Rfl:     ALLERGIES:   Aztreonam, [place] [aztreonam lysine], Cefepime, Other, Slo-bid 100, Tobramycin, and Banana    SOCIAL HISTORY:   Social History     Tobacco Use   ??? Smoking status: Former Smoker  Types: e-Cigarettes   ??? Smokeless tobacco: Never Used   Substance Use Topics   ??? Alcohol use: Not Currently     Alcohol/week: 3.0 standard drinks     Types: 3 Glasses of wine per week       FAMILY HISTORY:  Family History   Problem Relation Age of Onset   ??? Bipolar disorder Mother    ??? Depression Mother           Review of Systems    A 10 point review of systems was performed and is negative other than positive elements noted in HPI   Constitutional: Negative for fever.  Eyes: Negative for visual changes.  ENT: Negative for sore throat.  Cardiovascular: Positive for chest pain.  Respiratory: Positive for cough. Negative for shortness of breath.  Gastrointestinal: Negative for abdominal pain, vomiting or diarrhea.  Genitourinary: Negative for dysuria.  Musculoskeletal: Negative for back pain.  Skin: Negative for rash.  Neurological: Negative for headaches, focal weakness or numbness.    Physical Exam     VITAL SIGNS:    BP 119/77  - Pulse 86  - Temp 36.9 ??C (98.4 ??F) (Oral)  - Resp 18  - Ht 182.9 cm (6')  - Wt (!) 103 kg (227 lb)  - SpO2 96%  - BMI 30.79 kg/m??     Constitutional: Alert and oriented. Chronically ill appearing and in no distress.  Eyes: Conjunctivae are normal.  ENT       Head: Normocephalic and atraumatic.       Nose: Nasal congestion noted. No congestion.       Mouth/Throat: Mucous membranes are moist.       Neck: No stridor.  Cardiovascular: Normal rate, regular rhythm. 2+ radial pulses equal bilaterally. <2 second cap refill.  Respiratory: Lung sounds coarse throughout.   Gastrointestinal: Soft and nontender.   Genitourinary: No suprapubic tenderness  Musculoskeletal: Normal range of motion in all extremities.   Neurologic: Normal speech and language. No gross focal neurologic deficits are appreciated.  Skin: Skin is warm, dry. No rash noted.  Psychiatric: Mood and affect are normal. Speech and behavior are normal.     Radiology     XR Chest Portable   Final Result      No evidence of acute cardiopulmonary pathology.         XR Chest Portable    Result Date: 11/24/2020  EXAM: XR CHEST PORTABLE DATE: 11/24/2020 3:15 PM ACCESSION: [id number] UN DICTATED: 11/24/2020 3:14 PM INTERPRETATION LOCATION: [facility] CLINICAL INDICATION: 35 years old Male with COUGH  COMPARISON: 06/28/2020 chest radiograph. TECHNIQUE: Portable Chest Radiograph. FINDINGS: Left chest wall Port-A-Cath tip terminates over the right atrium. Sequela of right upper lobectomy and partial right sixth rib resection. Mildly hypoinflated lungs. No focal consolidation or pulmonary edema. Question trace right pleural effusion versus scarring. No evidence of pneumothorax. Stable cardiomediastinal silhouette.     No evidence of acute cardiopulmonary pathology.          EKG     EKG: Normal sinus rhythm.  Normal rate. No ST or T wave changes to indicate ischemia.  No PR, QT, or QRS interval prolongation.      Labs     Labs Reviewed   COMPREHENSIVE METABOLIC PANEL - Abnormal; Notable for the following components:       Result Value    Sodium 133 (*)     Glucose 362 (*)     All other components within normal limits   RAPID INFLUENZA/RSV/COVID  PCR - Normal    Narrative:     This test was performed using the Cepheid Xpert Xpress SARS-CoV-2/Flu/RSV plus assay, which has been validated by the CLIA-certified, CAP-inspected [facility]. FDA has granted Emergency Use Authorization for this test. Negative results do not preclude infection and should be interpreted along with clinical observations, patient history, and epidemiological information. Information for providers and patients can be found here: [email or web address]   HIGH SENSITIVITY TROPONIN I - SINGLE - Normal   SEDIMENTATION RATE - Normal   C-REACTIVE PROTEIN - Normal   BLOOD CULTURE   BLOOD CULTURE   CBC W/ DIFFERENTIAL    Narrative:     The following orders were created for panel order CBC w/ Differential.  Procedure                               Abnormality         Status                     --------- -----------         ------                     CBC w/ Differential[[id number]]                             Final result                 Please view results for these tests on the individual orders.   EXTRA TUBES    Narrative:     The following orders were created for panel order ED Extra Tubes.  Procedure                               Abnormality         Status                     ---------                               -----------         ------                     LIGHT BLUE CITRATE EXTR.Marland KitchenMarland Kitchen[[id number]]                      Final result                 Please view results for these tests on the individual orders.   TOBRAMYCIN LEVEL, RANDOM   CBC W/ AUTO DIFF   LIGHT BLUE CITRATE EXTRA TUBE       Pertinent labs & imaging results that were available during my care of the patient were reviewed by me and considered in my medical decision making (see chart for details).    Please note- This chart has been created using [employer]. Chart creation errors have been sought, but may not always be located and such creation errors, especially pronoun confusion, do NOT reflect on the standard of medical care.    Documentation assistance was provided by Malena Catholic, Scribe on November 24, 2020 at 3:27 PM for Berton Butrick Danella Penton, MD.    Documentation assistance was  provided by the scribe in my presence.  The documentation recorded by the scribe has been reviewed by me and accurately reflects the services I personally performed.         Dermot Gremillion Deberah Castle, MD  Resident  11/24/20 [phone]

## 2020-11-25 LAB — COMPREHENSIVE METABOLIC PANEL
ALBUMIN: 3.6 g/dL (ref 3.4–5.0)
ALKALINE PHOSPHATASE: 75 U/L (ref 46–116)
ALT (SGPT): 32 U/L (ref 10–49)
ANION GAP: 7 mmol/L (ref 5–14)
AST (SGOT): 19 U/L (ref <=34)
BILIRUBIN TOTAL: 0.8 mg/dL (ref 0.3–1.2)
BLOOD UREA NITROGEN: 15 mg/dL (ref 9–23)
BUN / CREAT RATIO: 23
CALCIUM: 8.6 mg/dL — ABNORMAL LOW (ref 8.7–10.4)
CHLORIDE: 99 mmol/L (ref 98–107)
CO2: 28.4 mmol/L (ref 20.0–31.0)
CREATININE: 0.66 mg/dL
EGFR CKD-EPI AA MALE: >90 mL/min/{1.73_m2} (ref >=60)
EGFR CKD-EPI NON-AA MALE: >90 mL/min/{1.73_m2} (ref >=60)
GLUCOSE RANDOM: 177 mg/dL (ref 70–179)
POTASSIUM: 3.6 mmol/L (ref 3.4–4.8)
PROTEIN TOTAL: 6.5 g/dL (ref 5.7–8.2)
SODIUM: 134 mmol/L — ABNORMAL LOW (ref 135–145)

## 2020-11-25 LAB — CBC W/ AUTO DIFF
BASOPHILS ABSOLUTE COUNT: 0 10*9/L (ref 0.0–0.1)
BASOPHILS RELATIVE PERCENT: 0.4 %
EOSINOPHILS ABSOLUTE COUNT: 0.1 10*9/L (ref 0.0–0.5)
EOSINOPHILS RELATIVE PERCENT: 1.7 %
HEMATOCRIT: 38.9 % — ABNORMAL LOW (ref 39.0–48.0)
HEMOGLOBIN: 13 g/dL (ref 12.9–16.5)
LYMPHOCYTES ABSOLUTE COUNT: 0.3 10*9/L — ABNORMAL LOW (ref 1.1–3.6)
LYMPHOCYTES RELATIVE PERCENT: 4.3 %
MEAN CORPUSCULAR HEMOGLOBIN CONC: 33.4 g/dL (ref 32.0–36.0)
MEAN CORPUSCULAR HEMOGLOBIN: 28.8 pg (ref 25.9–32.4)
MEAN CORPUSCULAR VOLUME: 86.1 fL (ref 77.6–95.7)
MEAN PLATELET VOLUME: 7.7 fL (ref 6.8–10.7)
MONOCYTES ABSOLUTE COUNT: 0.4 10*9/L (ref 0.3–0.8)
MONOCYTES RELATIVE PERCENT: 6.8 %
NEUTROPHILS ABSOLUTE COUNT: 5.6 10*9/L (ref 1.8–7.8)
NEUTROPHILS RELATIVE PERCENT: 86.8 %
NUCLEATED RED BLOOD CELLS: 0 /100{WBCs} (ref <=4)
PLATELET COUNT: 245 10*9/L (ref 150–450)
RED BLOOD CELL COUNT: 4.52 10*12/L (ref 4.26–5.60)
RED CELL DISTRIBUTION WIDTH: 14.4 % (ref 12.2–15.2)
WBC ADJUSTED: 6.4 10*9/L (ref 3.6–11.2)

## 2020-11-25 MED ADMIN — sodium chloride 7% nebulizer solution 4 mL: 4 mL | RESPIRATORY_TRACT | @ 19:00:00

## 2020-11-25 MED ADMIN — piperacillin-tazobactam (ZOSYN) IVPB (premix) 4.5 g: 4.5 g | INTRAVENOUS | @ 23:00:00 | Stop: 2020-12-09

## 2020-11-25 MED ADMIN — melatonin tablet 10.5 mg: 10 mg | ORAL | @ 03:00:00

## 2020-11-25 MED ADMIN — lamoTRIgine (LaMICtal) tablet 200 mg: 200 mg | ORAL | @ 17:00:00

## 2020-11-25 MED ADMIN — tobramycin (NEBCIN) 260 mg in sodium chloride (NS) 0.9 % 100 mL IVPB: 260 mg | INTRAVENOUS | @ 02:00:00 | Stop: 2020-12-01

## 2020-11-25 MED ADMIN — oxyCODONE (ROXICODONE) immediate release tablet 5 mg: 5 mg | ORAL | @ 03:00:00 | Stop: 2020-12-08

## 2020-11-25 MED ADMIN — atorvastatin (LIPITOR) tablet 20 mg: 20 mg | ORAL | @ 03:00:00

## 2020-11-25 MED ADMIN — famotidine (PEPCID) tablet 20 mg: 20 mg | ORAL | @ 17:00:00

## 2020-11-25 MED ADMIN — sodium chloride 7% nebulizer solution 4 mL: 4 mL | RESPIRATORY_TRACT | @ 15:00:00

## 2020-11-25 MED ADMIN — albuterol nebulizer solution 2.5 mg: 2.5 mg | RESPIRATORY_TRACT | @ 04:00:00

## 2020-11-25 MED ADMIN — pramipexole (MIRAPEX) tablet 0.5 mg: .5 mg | ORAL | @ 23:00:00

## 2020-11-25 MED ADMIN — cetirizine (ZyrTEC) tablet 10 mg: 10 mg | ORAL | @ 17:00:00

## 2020-11-25 MED ADMIN — sodium chloride 7% nebulizer solution 4 mL: 4 mL | RESPIRATORY_TRACT | @ 04:00:00

## 2020-11-25 MED ADMIN — lipase-protease-amylase (CREON) 36,000-114,000- 180,000 unit capsule 288,000 units of lipase **PATIENT-SUPPLIED**: 8 | ORAL | @ 05:00:00

## 2020-11-25 MED ADMIN — piperacillin-tazobactam (ZOSYN) IVPB (premix) 4.5 g: 4.5 g | INTRAVENOUS | @ 05:00:00 | Stop: 2020-12-09

## 2020-11-25 MED ADMIN — lisinopriL (PRINIVIL,ZESTRIL) tablet 10 mg: 10 mg | ORAL | @ 17:00:00

## 2020-11-25 MED ADMIN — dornase alfa (PULMOZYME) 1 mg/mL solution 2.5 mg: 2.5 mg | RESPIRATORY_TRACT | @ 15:00:00

## 2020-11-25 MED ADMIN — albuterol nebulizer solution 2.5 mg: 2.5 mg | RESPIRATORY_TRACT | @ 15:00:00

## 2020-11-25 MED ADMIN — fluticasone furoate-vilanteroL (BREO ELLIPTA) 200-25 mcg/dose inhaler 1 puff: 1 | RESPIRATORY_TRACT | @ 15:00:00

## 2020-11-25 MED ADMIN — pediatric multivitamin-vit D3 1,500 unit-vit K 800 mcg (MVW COMPLETE FORMULATION) capsule: 1 | ORAL | @ 17:00:00

## 2020-11-25 MED ADMIN — fluticasone propionate (FLONASE) 50 mcg/actuation nasal spray 1 spray: 1 | NASAL | @ 17:00:00

## 2020-11-25 MED ADMIN — albuterol nebulizer solution 2.5 mg: 2.5 mg | RESPIRATORY_TRACT | @ 19:00:00

## 2020-11-25 MED ADMIN — pediatric multivitamin-vit D3 1,500 unit-vit K 800 mcg (MVW COMPLETE FORMULATION) capsule: 1 | ORAL | @ 03:00:00

## 2020-11-25 MED ADMIN — piperacillin-tazobactam (ZOSYN) IVPB (premix) 4.5 g: 4.5 g | INTRAVENOUS | @ 16:00:00 | Stop: 2020-12-09

## 2020-11-25 MED ADMIN — oxyCODONE (ROXICODONE) immediate release tablet 5 mg: 5 mg | ORAL | @ 23:00:00 | Stop: 2020-12-08

## 2020-11-25 MED ADMIN — insulin lispro (HumaLOG) injection 0-12 Units: 0-12 [IU] | SUBCUTANEOUS | @ 17:00:00

## 2020-11-25 MED ADMIN — tobramycin (NEBCIN) 260 mg in sodium chloride (NS) 0.9 % 100 mL IVPB: 260 mg | INTRAVENOUS | @ 15:00:00 | Stop: 2020-12-01

## 2020-11-25 MED ADMIN — insulin glargine (LANTUS) injection 50 Units: 50 [IU] | SUBCUTANEOUS | @ 04:00:00

## 2020-11-25 MED ADMIN — rivaroxaban (XARELTO) tablet 20 mg: 20 mg | ORAL | @ 23:00:00

## 2020-11-25 MED ADMIN — oxyCODONE (ROXICODONE) immediate release tablet 5 mg: 5 mg | ORAL | @ 17:00:00 | Stop: 2020-12-08

## 2020-11-25 MED ADMIN — pantoprazole (PROTONIX) EC tablet 20 mg: 20 mg | ORAL | @ 17:00:00

## 2020-11-25 MED ADMIN — insulin lispro (HumaLOG) injection 0-12 Units: 0-12 [IU] | SUBCUTANEOUS | @ 03:00:00

## 2020-11-25 MED ADMIN — piperacillin-tazobactam (ZOSYN) IVPB (premix) 4.5 g: 4.5 g | INTRAVENOUS | @ 09:00:00 | Stop: 2020-12-09

## 2020-11-25 MED ADMIN — lipase-protease-amylase (CREON) 36,000-114,000- 180,000 unit capsule 288,000 units of lipase **PATIENT-SUPPLIED**: 8 | ORAL

## 2020-11-25 MED ADMIN — MORPhine 4 mg/mL injection 4 mg: 4 mg | INTRAVENOUS | @ 01:00:00 | Stop: 2020-11-24

## 2020-11-25 MED ADMIN — amitriptyline (ELAVIL) tablet 100 mg: 100 mg | ORAL | @ 03:00:00

## 2020-11-25 MED ADMIN — FLUoxetine (PROzac) capsule 60 mg: 60 mg | ORAL | @ 17:00:00

## 2020-11-25 MED ADMIN — insulin lispro (HumaLOG) injection 8 Units: 8 [IU] | SUBCUTANEOUS | @ 23:00:00 | Stop: 2020-11-25

## 2020-11-25 MED ADMIN — lipase-protease-amylase (CREON) 36,000-114,000- 180,000 unit capsule 288,000 units of lipase **PATIENT-SUPPLIED**: 8 | ORAL | @ 17:00:00

## 2020-11-25 MED ADMIN — gabapentin (NEURONTIN) capsule 600 mg: 600 mg | ORAL | @ 17:00:00

## 2020-11-25 MED ADMIN — elexacaftor-tezacaftor-ivacaft (TRIKAFTA) tablet **patient-supplied**: ORAL | @ 05:00:00 | Stop: 2020-11-25

## 2020-11-25 MED ADMIN — traZODone (DESYREL) tablet 100 mg: 100 mg | ORAL | @ 03:00:00

## 2020-11-25 MED ADMIN — elexacaftor-tezacaftor-ivacaft (TRIKAFTA) tablet 2 tablet **Patient Supplied**: 2 | ORAL | @ 17:00:00

## 2020-11-25 MED ADMIN — dornase alfa (PULMOZYME) 1 mg/mL solution 2.5 mg: 2.5 mg | RESPIRATORY_TRACT | @ 04:00:00

## 2020-11-25 NOTE — Unmapped (Signed)
General Medicine History and Physical    Assessment/Plan:    Principal Problem:    Cystic fibrosis with pulmonary exacerbation (CMS-HCC)  Active Problems:    Essential hypertension    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)    Pancreatic insufficiency due to cystic fibrosis (CMS-HCC)    Chronic deep vein thrombosis (DVT) of lower extremity (CMS-HCC)      Christian Young is a 35 y.o. male with PMHx of CF who presented to [facility] with Cystic fibrosis with pulmonary exacerbation (CMS-HCC).    *Acute exacerbation of CF bronchiectasis: presents with progressively worsening dry cough as well as chest pain, subjective fevers, and night sweats.  CXR with no consolidation.      - Consult Pulmonology  - MRSA screen/culture   - CF sputum/culture  - CXR PA and lateral  - Zosyn 4.5 mg q6h - Note: patient has had cefepime allergy and developed AIN from Zosyn.  Will need to monitor closely  - Tobramycin IV, pharmacy to dose  - Plan for at least 14 days of antibiotics  - Continue Trikafta (non-formulary), Breo (formulary alternative for Symbicort) and Pulmozyme  - Airway clearance with albuterol TID, 7% hypertonic saline, chest vest, and aerobika  - Monitor daily CBC and CMP  - Patient will require PFTs while in hospital (not yet ordered)    *Diabetes mellitus related to cystic fibrosis:  - SSI + Accuchecks ACHS   - Lantus 50 units nightly     *Pancreatic insufficiency due to CF:  - Continue Creon 8 capsules TID with meals and 4 capsules prn snacks  - Vitamins ADEK    *History of DVT: continue Xarelto 20 mg daily    CARE CHECK LIST:  - FEN/GI: regular diet, IVFs prn  - PPx: home Xarelto  - Dispo: admit to hospitalist, floor status  - Code: Full  ___________________________________________________________________    Chief Complaint:  Chief Complaint   Patient presents with    Chest Pain     Cystic fibrosis with pulmonary exacerbation (CMS-HCC)    HPI:  Christian Young is a 35 y.o. male with PMHx as reviewed in the EMR that presented to [facility] with Cystic fibrosis with pulmonary exacerbation (CMS-HCC).    PAtient reports that he went on vacation to [facility].  Since returning, he notes a progressively worsening non-productive cough with associated chest pain.  He reports pain is sharp and located in right-side of his chest.  He states that the symptoms are similar to typical CF flares but that they feel more severe than normal.  He notes night sweats and isolated temperature one day prior to admission with Tmax of 101.2.  Also endorses nasal congestion.  He reports compliance with his airway clearance.      He spoke with his pulmonologist, Dr. Maia Plan, who arranged for scheduled admission.  Because no beds were available, patient presented to ED.      Allergies:  Aztreonam, [place] [aztreonam lysine], Cefepime, Other, Slo-bid 100, Tobramycin, and Banana    Medications:   Prior to Admission medications    Medication Dose, Route, Frequency   amitriptyline (ELAVIL) 100 MG tablet TAKE ONE TABLET BY MOUTH AT BEDTIME   atorvastatin (LIPITOR) 20 MG tablet 20 mg, Oral, Nightly   budesonide-formoteroL (SYMBICORT) 160-4.5 mcg/actuation inhaler 2 puffs, Inhalation, 2 times a day (standard)   cetirizine (ZYRTEC) 10 MG tablet TAKE ONE TABLET BY MOUTH AT LUNCH   dornase alfa (PULMOZYME) 1 mg/mL nebulizer solution 2.5 mg, Inhalation, 2  times a day (standard)   elexacaftor-tezacaftor-ivacaft (TRIKAFTA) 100-50-75 mg(d) /150 mg (n) tablet Take 2 Tablets (orange) by mouth in the morning and 1 tablet (blue) in the evening with fatty food   famotidine (PEPCID) 20 MG tablet 20 mg, Oral, Daily (standard)   FLUoxetine 60 mg Tab 60 mg, Oral, Daily (standard)   gabapentin 600 mg Tb24 600 mg, Oral, Daily (standard)   insulin glargine (BASAGLAR, LANTUS) 100 unit/mL (3 mL) injection pen 70 Units, Subcutaneous, Daily (standard)  Patient taking differently: Inject 50 Units under the skin nightly.    lamoTRIgine (LAMICTAL) 200 MG tablet 200 mg, Oral, Daily (standard)   lisinopriL (PRINIVIL,ZESTRIL) 10 MG tablet 10 mg, Oral, Daily (standard)   melatonin 10 mg Tab 10 mg, Oral, Nightly   MVW Complete, pediatric multivit 61-D3-vit K, (MVW COMPLETE FORMULATION) 1,500-800 unit-mcg cap 1 capsule, Oral, 2 times a day (standard)   omeprazole (PRILOSEC) 20 MG capsule 20 mg, Oral, Daily (standard)   pramipexole (MIRAPEX) 0.125 MG tablet 0.25 mg, Oral, Every evening   traZODone (DESYREL) 100 MG tablet 100 mg, Oral, Nightly   XARELTO 20 mg tablet 20 mg, Oral, Daily (standard)   albuterol (PROVENTIL HFA;VENTOLIN HFA) 90 mcg/actuation inhaler 2 puffs, Inhalation, Every 6 hours PRN   albuterol 2.5 mg/0.5 mL nebulizer solution 2.5 mg, Nebulization, 2 times a day (standard)   BETHKIS 300 mg/4 mL Nebu Inhale 1 vial via nebulization every 12 hours for 28 days, alternating every other 28 days   blood sugar diagnostic (GLUCOSE BLOOD) Strp Other, 3 times a day (with meals), Rx sent to [facility] 01/04/20    blood-glucose meter kit Dispense meter that is preferred by patient's insurance company   blood-glucose meter,continuous (DEXCOM G6 RECEIVER) Misc Use as directed   CREON 36,000-114,000- 180,000 unit CpDR TAKE 8 CAPSULES BY MOUTH 3 TIMES DAILY WITH MEALS AND TAKE 4 CAPSULES TWICE DAILY WITH SNACKS *MAY TAKE UP TO 1 EXTRA SNACK DAILY. MAX 36 PER DAY*   DEXCOM G6 SENSOR Devi Use sq as directed every 14 days   DEXCOM G6 TRANSMITTER Devi Use as directed   EASY TOUCH LANCING DEVICE Misc Use as directed.   EASY TOUCH TWIST LANCETS 30 gauge Misc Use as directed.   fluticasone propionate (FLONASE) 50 mcg/actuation nasal spray USE 1 SPRAY IN EACH NOSTRIL EVERY DAY   insulin ASPART (NOVOLOG FLEXPEN) 100 unit/mL (3 mL) injection pen Use up to 150 units/day, divided 3 TIMES DAILY BEFORE MEALS   montelukast (SINGULAIR) 10 mg tablet TAKE ONE TABLET BY MOUTH AT BEDTIME   mupirocin (BACTROBAN) 2 % ointment Apply inside of both nostrils and to other affected areas twice daily.   MVW COMPLETE FORMUL PROBIOTIC 40 billion cell -15 mg CpDR 1 capsule, Oral, Daily (standard)  Patient taking differently: Take 1 capsule by mouth Two (2) times a day.    nebulizers Misc Use as directed with inhaled medications   pen needle, diabetic (BD ULTRA-FINE NANO PEN NEEDLE) 32 gauge x 5/32 (4 mm) Ndle use up to 4 times daily   sodium chloride 7% 7 % Nebu Inhale 4 mL by nebulization Two (2) times a day. Increase to 4 times while taking antibiotics       Medical History:  Past Medical History:   Diagnosis Date    Anxiety     Chronic pain disorder     Cystic fibrosis (CMS-HCC)     Depression     Hypertension     Nonproductive cough 04/05/2018  Surgical History:  Past Surgical History:   Procedure Laterality Date    IR INSERT PORT AGE GREATER THAN 5 YRS  03/27/2019    IR INSERT PORT AGE GREATER THAN 5 YRS 03/27/2019 Rush Barer, MD IMG [facility]IR [facility]    PR REMOVAL OF LUNG,LOBECTOMY Right 03/29/2017    Procedure: REMOVAL OF LUNG, OTHER THAN PNEUMONECTOMY; SINGLE LOBE (LOBECTOMY);  Surgeon: Cherie Dark, MD;  Location: MAIN OR [facility];  Service: Thoracic       Social History:  Social History     Socioeconomic History    Marital status: Married     Spouse name: Not on file    Number of children: Not on file    Years of education: Not on file    Highest education level: Not on file   Occupational History    Not on file   Tobacco Use    Smoking status: Former Smoker     Types: e-Cigarettes    Smokeless tobacco: Never Used   Substance and Sexual Activity    Alcohol use: Not Currently     Alcohol/week: 3.0 standard drinks     Types: 3 Glasses of wine per week    Drug use: No    Sexual activity: Not Currently   Other Topics Concern    Not on file   Social History Narrative    Not on file     Social Determinants of Health     Financial Resource Strain: Low Risk     Difficulty of Paying Living Expenses: Not hard at all   Food Insecurity: No Food Insecurity    Worried About Programme researcher, broadcasting/film/video in the Last Year: Never true    Ran Out of Food in the Last Year: Never true   Transportation Needs: No Transportation Needs    Lack of Transportation (Medical): No    Lack of Transportation (Non-Medical): No   Physical Activity: Not on file   Stress: Not on file   Social Connections: Not on file     Alcohol Use: Not on file       Family History:  Family History   Problem Relation Age of Onset    Bipolar disorder Mother     Depression Mother        Review of Systems:  10 systems reviewed and are negative unless otherwise mentioned in HPI    Labs/Studies:  Labs and Studies from the last 24hrs per EMR and Reviewed    Physical Exam:  Temp:  [36.9 ??C (98.4 ??F)] 36.9 ??C (98.4 ??F)  Heart Rate:  [86-95] 86  Resp:  [18] 18  BP: (119-131)/(77-80) 119/77  SpO2:  [95 %-96 %] 96 %    General: chronically ill appearing male in NAD  HEENT: anicteric sclera, moist mucous membranes  Neck: supple, no palpable LAD  CV: RRR, no murmurs, rubs, or gallops  Pulm: normal work of breathing, CTAB, no wheezes, rales, or rhonci, good air movement  Abd: soft, non-tender, non-distended, no organomegaly or masses, bowel sounds present  Ext: warm and well perfused, no edema  Skin: no rashes or petechiae on grossly visible areas  Neuro: grossly intact  Psych: appropriate mood and affect    I reviewed the Fellows  note and I was available.  I agree with the  Fellows  findings and plan, updated as clinically indicated.   Akhter Citron, MD

## 2020-11-25 NOTE — Unmapped (Signed)
Aminoglycoside Therapeutic Monitoring Pharmacy Note    Christian Young is a 35 y.o. male continuing tobramycin. Date of therapy initiation: 04/08/2019    Indication: CF exacerbation    Prior Dosing Information: Previous dosing regimen 260 mg IV every 12 hours (during admission 06/2020)    Goals:  Therapeutic Drug Levels  ? Trough level: tobramycin <1 mg/L  ? Peak level: tobramycin 10-14 mg/L    Additional Clinical Monitoring/Outcomes  Renal function, volume status (intake and output)    Results:   ?? Not applicable       Wt Readings from Last 1 Encounters:   11/24/20 (!) 103 kg (227 lb)     Lab Results   Component Value Date    CREATININE 0.61 11/24/2020       Pharmacokinetic Considerations and Significant Drug Interactions:  ??? Adult (calculated on 4/14): Vd = 28.8 L, ke = 0.298 hr-1  ??? Concurrent nephrotoxic meds: Received one dose of Zosyn 4.5 grams in ED     Assessment/Plan:  Recommendation(s)  ??? Start 260 mg IV every 12 hours  ??? Estimated peak and trough on recommended regimen: peak = 11.9 mg/L, trough = 0.4 mg/L    Follow-up  ??? Level due: peak and trough levels around the fourth or fifth dose  ??? A pharmacist will continue to monitor and order levels as appropriate    Please page service pharmacist with questions/clarifications.    Winferd Humphrey, PharmD

## 2020-11-25 NOTE — Unmapped (Signed)
PULMONARY CONSULT  NOTE      Patient: Christian Young(09/22/1985)  Reason for consultation: Christian Young is a 35 y.o. male who is seen in consultation at the request of Rica Koyanagi, MD for comprehensive evaluation of CF exacerbation.    Assessment and Recommendations:      Principal Problem:    Cystic fibrosis with pulmonary exacerbation (CMS-HCC)  Active Problems:    Essential hypertension    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)    Pancreatic insufficiency due to cystic fibrosis (CMS-HCC)    Chronic deep vein thrombosis (DVT) of lower extremity (CMS-HCC)  Resolved Problems:    * No resolved hospital problems. *    Christian Young is a 35 y.o. male with PMHx of CF who presented to [facility] with Cystic fibrosis with pulmonary exacerbation (CMS-HCC).  ??  *Acute exacerbation of CF bronchiectasis: Pt recommended to purdue admission by his primary CF doc.  He has grown MSSA and 2 variants of PSAS (one pan resistant and one with intermediate resistance)  - CF sputum/culture  - CXR PA and lateral with sequela of RUL lobectomy and chronic scarring in R base  - Zosyn 4.5 mg q6h - Note: patient has had cefepime allergy and developed AIN from Zosyn.  Will need to monitor closely  - Tobramycin IV, pharmacy to dose  - MRSA screen/ cx pending.  - Plan for at least 14 days of antibiotics.  Will likely need to be completed as an inpt.  - Continue Trikafta (non-formulary), Breo (formulary alternative for Symbicort) and Pulmozyme  - Airway clearance with albuterol TID, 7% hypertonic saline, chest vest, and aerobika  - Monitor daily CBC and CMP  - Patient will require PFTs while in hospital.  Ordered for 4/19        We appreciate the opportunity to assist in the care of this patient.  Please page [phone] with any questions.    Verneita Griffes, MD    Subjective:      History of Present Illness:  Christian Young is a 35 y.o. male admitted for CF exacerbation.  Has had recent pleuritic chest pain and fever.  Recommended by primary CF doc to present for admission and IV Abx.     Review of Systems: A comprehensive review of systems was performed and was negative except as above in HPI.  Past Medical History:   Diagnosis Date   ??? Anxiety    ??? Chronic pain disorder    ??? Cystic fibrosis (CMS-HCC)    ??? Depression    ??? Hypertension    ??? Nonproductive cough 04/05/2018     Past Surgical History:   Procedure Laterality Date   ??? IR INSERT PORT AGE GREATER THAN 5 YRS  03/27/2019    IR INSERT PORT AGE GREATER THAN 5 YRS 03/27/2019 Rush Barer, MD IMG VIR [facility]   ??? PR REMOVAL OF LUNG,LOBECTOMY Right 03/29/2017    Procedure: REMOVAL OF LUNG, OTHER THAN PNEUMONECTOMY; SINGLE LOBE (LOBECTOMY);  Surgeon: Cherie Dark, MD;  Location: MAIN OR [facility];  Service: Thoracic     Medications reviewed in Epic  Allergies as of 11/24/2020 - Reviewed 11/24/2020   Allergen Reaction Noted   ??? Aztreonam Anaphylaxis, Hives, Nausea And Vomiting, and Rash 09/06/2015   ??? [place] [aztreonam lysine] Anaphylaxis 12/27/2016   ??? Cefepime Itching, Nausea Only, and Other (See Comments) 09/06/2015   ??? Other Anaphylaxis and Other (See Comments) 09/06/2015   ??? Slo-bid 100 Anaphylaxis 06/20/2017   ???  Tobramycin Tinnitus 09/06/2015   ??? Banana Itching and Nausea And Vomiting 07/29/2016     Family History   Problem Relation Age of Onset   ??? Bipolar disorder Mother    ??? Depression Mother      Social History     Tobacco Use   ??? Smoking status: Former Smoker     Types: e-Cigarettes   ??? Smokeless tobacco: Never Used   Substance Use Topics   ??? Alcohol use: Not Currently     Alcohol/week: 3.0 standard drinks     Types: 3 Glasses of wine per week        Objective:      Physical Exam:  Vitals:    11/24/20 2207 11/24/20 2213 11/24/20 2337 11/25/20 0600   BP:  121/88  107/67   Pulse:  99 80 86   Resp:  18 18 18    Temp:  37.1 ??C (98.8 ??F)  37.1 ??C (98.8 ??F)   TempSrc:  Oral  Oral   SpO2:  95% 100% 97%   Weight: (!) 101.3 kg (223 lb 4.8 oz) (!) 101.3 kg (223 lb 4.8 oz)     Height:  182.9 cm (6' 0.01)         General: chronically ill appearing male in NAD  HEENT: anicteric sclera, moist mucous membranes  Neck: supple, no palpable LAD  CV: RRR, no murmurs, rubs, or gallops  Pulm: normal work of breathing, Good air movement.  No wheezes or crackles.  Abd: soft, non-tender, non-distended, bowel sounds present  Ext: warm and well perfused, no edema  Neuro: grossly intact  Psych: appropriate mood and affect    Malnutrition Assessment by RD:          Diagnostic Review:   All labs and images were personally reviewed.

## 2020-11-25 NOTE — Unmapped (Signed)
Adult Inpatient CF Initial Assessment        Christian Young is a 35 y.o. male who was admitted for Bronchiectasis with acute exacerbation (CMS-HCC) [J47.1]  Shortness of breath [R06.02]  Cough [R05.9]  Cystic fibrosis with pulmonary exacerbation (CMS-HCC) [E84.0]  Chest pain, unspecified type [R07.9].    Heart Rate: 80   Respiratory Rate: 18   Bilateral Breath Sounds:BBS clear     Cough/Sputum: How Much? none                              Color:    Are they coughing up blood? Yes:                                                       No: X                                                     How Much?    Home medication & frequency :    Albuterol Neb 2.5mg  BID  Hypertonic Saline 7% BID  Pulmozyme 2.5mg  BID  Tobi Iv   Advair BID  Symbicort BID  Home airway clearance & frequency:    Vest   Aerobika      Times Chosen for Inhaled Medications and Airway Clearance: Home Vest      Methods tried & found to be ineffective:  V[facility]ra Lung      Home O2? Yes:                       No:X  If yes, what is their home requirement:     Home non-invasive ventilation? Yes:                                                          No:X  If yes, enter type of device, settings, & mode:    Airway clearance technique method offered & decided upon:     Comments:

## 2020-11-25 NOTE — Unmapped (Signed)
Problem: Adult Inpatient Plan of Care  Goal: Plan of Care Review  Outcome: Progressing  Goal: Patient-Specific Goal (Individualized)  Outcome: Progressing  Goal: Absence of Hospital-Acquired Illness or Injury  Outcome: Progressing  Intervention: Identify and Manage Fall Risk  Recent Flowsheet Documentation  Taken 11/25/2020 0000 by Mayo Ao, RN  Safety Interventions: nonskid shoes/slippers when out of bed  Intervention: Prevent and Manage VTE (Venous Thromboembolism) Risk  Recent Flowsheet Documentation  Taken 11/25/2020 0000 by Andrey Farmer Marlow-Freeman, RN  Activity Management: up ad lib  Goal: Optimal Comfort and Wellbeing  Outcome: Progressing  Goal: Readiness for Transition of Care  Outcome: Progressing  Goal: Rounds/Family Conference  Outcome: Progressing     Problem: Infection  Goal: Absence of Infection Signs and Symptoms  Outcome: Progressing  Intervention: Prevent or Manage Infection  Recent Flowsheet Documentation  Taken 11/25/2020 0000 by Andrey Farmer Marlow-Freeman, RN  Isolation Precautions: contact precautions maintained     Problem: Adjustment to Illness (Cystic Fibrosis)  Goal: Optimal Coping  Outcome: Progressing     Problem: Infection (Cystic Fibrosis)  Goal: Absence of Infection Signs and Symptoms  Outcome: Progressing  Intervention: Manage Infection and Prevent Transmission  Recent Flowsheet Documentation  Taken 11/25/2020 0000 by Andrey Farmer Marlow-Freeman, RN  Isolation Precautions: contact precautions maintained     Problem: Oral Intake Inadequate (Cystic Fibrosis)  Goal: Optimal Nutrition Intake  Outcome: Progressing     Problem: Malabsorption (Cystic Fibrosis)  Goal: Optimal Bowel Elimination  Outcome: Progressing     Problem: Respiratory Compromise (Cystic Fibrosis)  Goal: Effective Oxygenation and Ventilation  Outcome: Progressing  Intervention: Optimize Oxygenation and Ventilation  Recent Flowsheet Documentation  Taken 11/25/2020 0000 by Andrey Farmer Marlow-Freeman, RN  Head of Bed [facility]) Positioning: HOB at 20-30 degrees

## 2020-11-25 NOTE — Unmapped (Signed)
Care Management  Initial Transition Planning Assessment  CM initial assessment completed telephonically as a precautionary measure in accordance with [facility] COVID-19 Pandemic emergency response plan. Patient verbalized understanding and agreement with completing this assessment by telephone.     Per EMR, patient is a 35 y.o. male with PMHx of CF who presented to [facility] with Cystic fibrosis with pulmonary exacerbation.    Lives with  Wife. Patient stated she is independent, uses no DMES. Lives in a 1 story residence that has 2 step to get into the house. No HH preference and wife will be his ride home.               General  Care Manager assessed the patient by : Telephone conversation with patient  Orientation Level: Oriented X4  Functional level prior to admission: Independent  Reason for referral: Discharge Planning    Contact/Decision Maker  Extended Emergency Contact Information  Primary Emergency Contact: Carmin [place]  Mobile Phone: [phone]  Relation: Spouse  Preferred language: ENGLISH  Interpreter needed? No    Legal Next of Kin / Guardian / POA / Advance Directives     Advance Directive (Medical Treatment)  Does patient have an advance directive covering medical treatment?: Patient has advance directive covering medical treatment, copy not in chart.  Advance directive covering medical treatment not in Chart:: Copy requested from family  Reason patient does not have an advance directive covering medical treatment:: Patient does not wish to complete one at this time.    Health Care Decision Maker [HCDM] (Medical & Mental Health Treatment)  Healthcare Decision Maker: HCDM documented in the HCDM/Contact Info section.  Information offered on HCDM, Medical & Mental Health advance directives:: Patient declined information.    Advance Directive (Mental Health Treatment)  Does patient have an advance directive covering mental health treatment?: Patient does not have advance directive covering mental health treatment.  Reason patient does not have an advance directive covering mental health treatment:: Patient does not wish to complete one at this time.    Patient Information  Lives with: Spouse/significant other    Type of Residence: Private residence    Type of Residence: Mailing Address:  [id number]  [place] [address]  Contacts:    Patient Phone Number: [phone] (cell)       Medical Provider(s): [facility]  Reason for Admission: Admitting Diagnosis:  Bronchiectasis with acute exacerbation (CMS-HCC) [J47.1]  Shortness of breath [R06.02]  Cough [R05.9]  Cystic fibrosis with pulmonary exacerbation (CMS-HCC) [E84.0]  Chest pain, unspecified type [R07.9]  Past Medical History:   has a past medical history of Anxiety, Chronic pain disorder, Cystic fibrosis (CMS-HCC), Depression, Hypertension, and Nonproductive cough (04/05/2018).  Past Surgical History:   has a past surgical history that includes pr removal of lung,lobectomy (Right, 03/29/2017) and IR Insert Port Age Greater Than 5 Years (03/27/2019).   Previous admit date: 06/28/2020    Primary Insurance- Payor: Medical sales representative / Plan: [employer] CT GEN CHOICE FUND OA PLUS / Product Type: *No Product type* /   Secondary Insurance ??? None  Prescription Coverage ???   Preferred Pharmacy - [facility] [place], [place] - [address]  [facility]  [facility] [place], [place] - [address]    Transportation home: Private vehicle     Location/Detail: [place], [id number]    Support Systems/Concerns: Spouse, Family Members, Friends/Neighbors, Parent    Responsibilities/Dependents at home?:  No    Home Care services in place prior to admission?: No      Equipment Currently Used at Home: none  Current HME Agency (Name/Phone #): None    Currently receiving outpatient dialysis?: No     Financial Information     Need for financial assistance?: No     Social Determinants of Health  Social Determinants of Health     Tobacco Use: Medium Risk   ??? Smoking Tobacco Use: Former Smoker   ??? Smokeless Tobacco Use: Never Used   Alcohol Use: Not on file   Financial Resource Strain: Low Risk    ??? Difficulty of Paying Living Expenses: Not very hard   Food Insecurity: No Food Insecurity   ??? Worried About Running Out of Food in the Last Year: Never true   ??? Ran Out of Food in the Last Year: Never true   Transportation Needs: No Transportation Needs   ??? Lack of Transportation (Medical): No   ??? Lack of Transportation (Non-Medical): No   Physical Activity: Not on file   Stress: Not on file   Social Connections: Not on file   Intimate Partner Violence: Not on file   Depression: Not on file   Housing/Utilities: Low Risk    ??? Within the past 12 months, have you ever stayed: outside, in a car, in a tent, in an overnight shelter, or temporarily in someone else's home (i.e. couch-surfing)?: No   ??? Are you worried about losing your housing?: No   ??? Within the past 12 months, have you been unable to get utilities (heat, electricity) when it was really needed?: No   Substance Use: Not on file   Health Literacy: Low Risk    ??? : Never     Discharge Needs Assessment  Concerns to be Addressed: discharge planning    Clinical Risk Factors: Multiple Diagnoses (Chronic)    Barriers to taking medications: No    Prior overnight hospital stay or ED visit in last 90 days: No    Readmission Within the Last 30 Days: no previous admission in last 30 days     Anticipated Changes Related to Illness: none    Equipment Needed After Discharge: none    Discharge Facility/Level of Care Needs: other (see comments) (Home with self care)    Readmission  Risk of Unplanned Readmission Score: UNPLANNED READMISSION SCORE: 19%  Predictive Model Details          19% (Medium)  Factor Value    Calculated 11/25/2020 12:04 46% Number of active Rx orders 66    [place] Risk of Unplanned Readmission Model 11% Number of ED visits in last six months 2     9% ECG/EKG order present in last 6 months     9% Latest calcium low (8.6 mg/dL)     7% Imaging order present in last 6 months     5% Number of hospitalizations in last year 1     4% Active anticoagulant Rx order present     3% Age 49     2% Charlson Comorbidity Index 2     2% Future appointment scheduled     1% Active ulcer medication Rx order present     1% Current length of stay 0.712 days      Readmitted Within the Last 30 Days? (No if blank)   Patient at risk for readmission?: No    Discharge Plan  Screen findings are: Care Manager reviewed the plan of the patient's  care with the Multidisciplinary Team. No discharge planning needs identified at this time. Care Manager will continue to manage plan and monitor patient's progress with the team.    Expected Discharge Date: TBD    Expected Transfer from Critical Care:  N/A    Quality data for continuing care services shared with patient and/or representative?: Yes  Patient and/or family were provided with choice of facilities / services that are available and appropriate to meet post hospital care needs?: Yes   List choices in order highest to lowest preferred, if applicable. : No preference    Initial Assessment complete?: Yes

## 2020-11-25 NOTE — Unmapped (Signed)
Hospitalist Daily Progress Note     LOS: 1 day       Assessment/Plan:  Principal Problem:    Cystic fibrosis with pulmonary exacerbation (CMS-HCC)  Active Problems:    Essential hypertension    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)    Pancreatic insufficiency due to cystic fibrosis (CMS-HCC)    Chronic deep vein thrombosis (DVT) of lower extremity (CMS-HCC)  Resolved Problems:    * No resolved hospital problems. *         Christian Young is a 35 y.o. male with PMHx of CF who presented to [facility] with Cystic fibrosis with pulmonary exacerbation (CMS-HCC).  ??  *Acute exacerbation of CF bronchiectasis: presents with progressively worsening dry cough as well as chest pain, subjective fevers, and night sweats.  CXR with no consolidation.      - Pulmonology consult in place, appreciated.  - MRSA screen/culture   - CF sputum/culture  - CXR PA and lateral  - Zosyn 4.5 mg q6h - Note: patient has had cefepime allergy and developed AIN from Zosyn.  Will need to monitor closely  - Tobramycin IV, pharmacy to dose  - Plan for at least 14 days of antibiotics  - Continue Trikafta (non-formulary), Breo (formulary alternative for Symbicort) and Pulmozyme  - Airway clearance with albuterol TID, 7% hypertonic saline, chest vest, and aerobika  - Monitor daily CBC and CMP  - Patient will require PFTs while in hospital (not yet ordered)  ??  *Diabetes mellitus related to cystic fibrosis:  - SSI + Accuchecks ACHS   - Lantus 50 units nightly   ??  *Pancreatic insufficiency due to CF:  - Continue Creon 8 capsules TID with meals and 4 capsules prn snacks  - Vitamins ADEK  ??  *History of DVT: continue Xarelto 20 mg daily  ??  CARE CHECK LIST:  - FEN/GI: regular diet, IVFs prn  - PPx: home Xarelto  - Code: Full  ___________________________________________________________________  Scheduled Meds:  ??? albuterol  2.5 mg Nebulization TID   ??? amitriptyline  100 mg Oral Nightly   ??? atorvastatin  20 mg Oral Nightly   ??? cetirizine  10 mg Oral Daily   ??? dornase alfa  2.5 mg Inhalation BID   ??? elexacaftor-tezacaftor-ivacaft  2 tablet Oral Daily    And   ??? elexacaftor-tezacaftor-ivacaft  1 tablet Oral At bedtime   ??? famotidine  20 mg Oral Daily   ??? FLUoxetine  60 mg Oral Daily   ??? fluticasone furoate-vilanteroL  1 puff Inhalation Daily (RT)   ??? fluticasone propionate  1 spray Each Nare Daily   ??? gabapentin  600 mg Oral Daily   ??? insulin glargine  50 Units Subcutaneous Nightly   ??? insulin lispro  0-12 Units Subcutaneous ACHS   ??? lamoTRIgine  200 mg Oral Daily   ??? lipase-protease-amylase  8 capsule Oral 3xd Meals   ??? lisinopriL  10 mg Oral Daily   ??? melatonin  10.5 mg Oral Nightly   ??? pantoprazole  20 mg Oral Daily   ??? MVW Complete (pediatric multivit 61-D3-vit K)  1 capsule Oral BID   ??? piperacillin-tazobactam (ZOSYN) IV (intermittent)  4.5 g Intravenous Q6H   ??? pramipexole  0.5 mg Oral Q PM   ??? rivaroxaban  20 mg Oral Daily   ??? sodium chloride 7%  4 mL Nebulization TID   ??? tobramycin (NEBCIN) IVPB (conventional dosing)  260 mg Intravenous Q12H   ??? traZODone  100 mg  Oral Nightly     Continuous Infusions:  PRN Meds:.acetaminophen, albuterol, dextrose in water, lipase-protease-amylase, ondansetron, oxyCODONE, senna    *DVT prophylaxis: He is fully ambulatory.    *Disposition: At this time, the plan is to remain in house for at least 14 days of intravenous antibiotics      Please page the [facility]) pager at [phone] with questions.      Consultants:   1.  Pulmonology      Subjective:   No complaint of fever, chills.  Feeling a little better since getting into the hospital.    Objective:       Vital signs in last 24 hours:  Temp:  [37.1 ??C (98.8 ??F)] 37.1 ??C (98.8 ??F)  Heart Rate:  [80-99] 86  Resp:  [16-18] 18  BP: (107-121)/(67-88) 107/67  MAP (mmHg):  [79-99] 79  SpO2:  [95 %-100 %] 97 %  BMI (Calculated):  [30.28] 30.28    Intake/Output last 24 hours:  No intake or output data in the 24 hours ending 11/25/20 1632      Physical Exam:    General:??chronically ill??appearing??male??in NAD, just waking up from sleep, pleasant, conversant.  HEENT: anicteric sclera, moist mucous membranes  CV: RRR, no murmurs, rubs, or gallops  Pulm: normal work of breathing, Good air movement.  No wheezes or crackles.  Abd: soft, non-tender, non-distended, bowel sounds present  Ext: warm and well perfused, no edema  Neuro: Cranial nerves II through XII grossly intact, speech fluent.  No acute focal motor deficits.  Psych: appropriate mood and affect    Medications:   Scheduled Meds:  ??? albuterol  2.5 mg Nebulization TID   ??? amitriptyline  100 mg Oral Nightly   ??? atorvastatin  20 mg Oral Nightly   ??? cetirizine  10 mg Oral Daily   ??? dornase alfa  2.5 mg Inhalation BID   ??? elexacaftor-tezacaftor-ivacaft  2 tablet Oral Daily    And   ??? elexacaftor-tezacaftor-ivacaft  1 tablet Oral At bedtime   ??? famotidine  20 mg Oral Daily   ??? FLUoxetine  60 mg Oral Daily   ??? fluticasone furoate-vilanteroL  1 puff Inhalation Daily (RT)   ??? fluticasone propionate  1 spray Each Nare Daily   ??? gabapentin  600 mg Oral Daily   ??? insulin glargine  50 Units Subcutaneous Nightly   ??? insulin lispro  0-12 Units Subcutaneous ACHS   ??? lamoTRIgine  200 mg Oral Daily   ??? lipase-protease-amylase  8 capsule Oral 3xd Meals   ??? lisinopriL  10 mg Oral Daily   ??? melatonin  10.5 mg Oral Nightly   ??? pantoprazole  20 mg Oral Daily   ??? MVW Complete (pediatric multivit 61-D3-vit K)  1 capsule Oral BID   ??? piperacillin-tazobactam (ZOSYN) IV (intermittent)  4.5 g Intravenous Q6H   ??? pramipexole  0.5 mg Oral Q PM   ??? rivaroxaban  20 mg Oral Daily   ??? sodium chloride 7%  4 mL Nebulization TID   ??? tobramycin (NEBCIN) IVPB (conventional dosing)  260 mg Intravenous Q12H   ??? traZODone  100 mg Oral Nightly     Continuous Infusions:    Lab Results   Component Value Date    WBC 6.4 11/25/2020    HGB 13.0 11/25/2020    HCT 38.9 (L) 11/25/2020    PLT 245 11/25/2020       Lab Results   Component Value Date    NA 134 (L)  11/25/2020    K 3.6 11/25/2020    CL 99 11/25/2020    CO2 28.4 11/25/2020    BUN 15 11/25/2020    CREATININE 0.66 11/25/2020    GLU 177 11/25/2020    CALCIUM 8.6 (L) 11/25/2020    MG 1.7 07/11/2020    PHOS 3.9 08/12/2018       Lab Results   Component Value Date    BILITOT 0.8 11/25/2020    BILIDIR 0.20 07/04/2020    PROT 6.5 11/25/2020    ALBUMIN 3.6 11/25/2020    ALT 32 11/25/2020    AST 19 11/25/2020    ALKPHOS 75 11/25/2020       Lab Results   Component Value Date    PT 20.7 (H) 06/28/2020    INR 1.78 06/28/2020     Recent Labs     11/24/20  1253 11/25/20  0626   NA 133* 134*   K 4.2 3.6   CL 98 99   CO2 24.6 28.4   BUN 16 15   CREATININE 0.61 0.66   GLU 362* 177   CALCIUM 9.5 8.6*   PROT 7.4 6.5   BILITOT 0.4 0.8   AST 26 19   ALT 39 32   ALKPHOS 77 75   ESR 10  --    CRP <4.0  --      Recent Labs     11/24/20  1253   TROPONINI <3     Pending Labs     Order Current Status    MRSA Screen In process    Blood Culture #1 Preliminary result    Blood Culture #2 Preliminary result          35 minutes, over 50% spent in counseling and coordination of care.  Tawni Levy MD

## 2020-11-25 NOTE — Unmapped (Signed)
Cystic Fibrosis Nutrition Assessment    Inpatient, Telehealth via phone: MD Consult this admission and related follow up  Primary Pulmonary Provider: Dr [name]   ===================================================================  Christian Young is a 35 y.o. male w/ PMH significant for CF, CFRD, pancreatic insufficiency who presented on 4/14 with CF exacerbation. Plan for IV abx.  ===================================================================  INTERVENTION:    1. Change diet to High Calorie High Protein    2. Recommend check PT. If elevated start 5mg  phytonadione daily x 5 days, then recheck PT. If WNL start 5mg  phytonadione twice weekly while on IV antibiotics.  - defer to team given xarelto    3. Recommend CF vitamin regimen:  MVW Complete Formulation gel cap 2 daily     4. Weigh patient twice weekly this admit    5. Continue remainder of nutrition regimen:  - enzyme regimen; Creon 36,000 x 8 caps/meal, 4 caps/snack  - acid reducer; pepcid, protonix  - bowel regimen; senna PRN  - insulin regimen per endo    Inpatient:   Will follow up with patient per protocol: 1-2 times per week (and more frequent as indicated)  Monitor for potential discharge needs with multi-disciplinary team.   ===================================================================  ASSESSMENT:  Nutrition Category = Adult Class 1 Obesity: BMI 30 to < 35 kg/m2    Estimated daily needs: 3717 kcal/day, 126-169 g PRO/day, 3216 mL fluid/day  Calories estimated using Cystic Fibrosis Conference Formula, protein per DRI x 1.5-2, fluid per [facility]    Current diet is not appropriate for CF. Current PO intake is adequate to meet estimated CF needs. Patient continues to work towards goals for weight management.   Enzyme dose is within established guidelines. Vitamin prescription is appropriate to reach/maintain optimal fat soluble vitamin levels. Patient may benefit from vitamin K supplementation while on IV antibiotics. Sodium needs for CF met with PO and/or supplement. Bowel regimen is appropriate. Acid reducer appropriate for GERD and enzyme activation. Patient on CFTR modulator is consuming adequate amounts of fat-containing foods with prescribed medication to optimize absorption.    ASPEN/AND Malnutrition Screening:  Patient does not meet ASPEN/AND criteria for malnutrition at this time.    Nutrition Focused Physical Exam:   Assessment not indicated d/t lack of malnutrition risk factors.    Goals:  1. Ongoing:  Meet estimated daily needs  2. Ongoing:  Reach/maintain established anthropometric goals for Adult CF: BMI < 30 kg/m2   3. Ongoing:  Normal fat-soluble vitamin levels: Vitamin A, Vitamin E and PT per lab range; Vitamin D 25OH total >30   4. Ongoing:  Maintain glucose control. Carbohydrate content of diet should comprise 40-50% of total calorie needs, but carbohydrates are not restricted in this population.    5. Ongoing:  Meet sodium needs for CF     Nutrition goals reviewed, and relevant barriers identified and addressed: none evident.   Patient is evaluated to have good  willingness and ability to achieve nutrition goals.   ===================================================================  INPATIENT:  Christian Young is admitted with Bronchiectasis with acute exacerbation (CMS-HCC) [J47.1]  Shortness of breath [R06.02]  Cough [R05.9]  Cystic fibrosis with pulmonary exacerbation (CMS-HCC) [E84.0]  Chest pain, unspecified type [R07.9].    Current Nutrition Orders (inpatient):  Oral intake        Nutrition Orders   (From admission, onward)             Start     Ordered    11/24/20 1728  Nutrition Therapy Regular/House  Effective now  Question:  Nutrition Therapy:  Answer:  Regular/House    11/24/20 1727              CF Nutrition related medications (inpatient): Nutritionally relevant medications reviewed.  lipitor  trikafta  pepcid  Insulin regimen  Creon 36,000 x 8 caps/meal, 4 caps/snack  protonix  MVW complete formulation gel cap BID    CF Nutrition related labs (inpatient):   Nutritionally pertinent labs reviewed.   ==================================================================  CLINICAL DATA:  Past Medical History:   Diagnosis Date   ??? Anxiety    ??? Chronic pain disorder    ??? Cystic fibrosis (CMS-HCC)    ??? Depression    ??? Hypertension    ??? Nonproductive cough 04/05/2018     Anthroprometric Evaluation:  Weight changes: per EPIC wt hx, pt has experienced 21 lb weight loss over the past year; intentional  CFTR modulator and weight change: On Trikafta  BMI Readings from Last 3 Encounters:   11/24/20 30.28 kg/m??   06/28/20 31.64 kg/m??   01/04/20 33.09 kg/m??     Wt Readings from Last 3 Encounters:   11/24/20 (!) 101.3 kg (223 lb 4.8 oz)   06/28/20 (!) 105.8 kg (233 lb 4.8 oz)   01/04/20 (!) 110.7 kg (244 lb)     Ht Readings from Last 3 Encounters:   11/24/20 182.9 cm (6' 0.01)   06/28/20 182.9 cm (6')   01/04/20 182.9 cm (6')     ==================================================================  Energy Intake (outpatient):  Diet: [name] endorses stable appetite and PO intake PTA, no deviation from baseline. Weight loss achieved by consuming smaller portion sizes.     Allergies, Intolerances, Sensitivities, and/or Cultural/Religious Dietary Restrictions:  See below  Allergies   Allergen Reactions   ??? Aztreonam Anaphylaxis, Hives, Nausea And Vomiting and Rash     fevers  Patient stated that he only vomited x1 with [place] in the past.????    fevers  Patient stated that he only vomited x1 with [place] in the past.????   ??? [place] [Aztreonam Lysine] Anaphylaxis   ??? Cefepime Itching, Nausea Only and Other (See Comments)     Headaches also   ??? Other Anaphylaxis and Other (See Comments)     Other reaction(s): Other (See Comments)  Bananas: itchy throat  Slo Bid record from [employer] states anaphylaxis.????Pt states this was from childhood and does not know reaction.  Bananas, causes itchy throat   ??? Slo-Bid 100 Anaphylaxis   ??? Tobramycin Tinnitus     From OSH record-documented as tinnitus but has received IV tobra with close monitoring.  Pt has [facility]ram[facility] since this allergy documented    From OSH record-documented as tinnitus but has received IV tobra with close monitoring.   ??? Banana Itching and Nausea And Vomiting   Sodium in diet: Adequate from diet  Calcium in diet:  Adequate from diet  Food Insecurity:    -   Food Insecurity: No Food Insecurity   ??? Worried About Programme researcher, broadcasting/film/video in the Last Year: Never true   ??? Ran Out of Food in the Last Year: Never true   CFTR modulator and Diet: Prescribed Trikafta (elexacaftor/tezacaftor/ivacaftor).  PO Supplements: none  Patient resources for DME/formula: n/a  Appetite Stimulant: none  Enteral feeding tube: none  Physical activity: limited recently 2/2 back pain    GI/Malabsorption (outpatient):  Enzyme brand, (meals/snacks):  Creon 36,000 @ 8/meal and 4/snack  Enzyme administration details: correct pre-meal administration., good compliance at all meals and  snacks  Enzyme dose per MEAL (units lipase/kg/meal) 2843  Enzyme dose per DAY (units lipase/kg/day) 16109  GI meds:  Nutritionally relevant medications reviewed.  Stools (steatorrhea): 1-2 daily, not greasy  Stools (constipation): no s/s of constipation  GI symptoms:  none  Fecal Fat Studies: pancreatic insufficient    No results found for: [id number]  Lab Results   Component Value Date    ELAST <15 (L) 03/19/2017     No results found for: PELAI    Vitamins/Minerals (outpatient):  CF-specific MVI, dose, compliance: MVW Complete Formulation Softgel regular 2 daily, good compliance  Other vitamins/minerals/herbals: none  Patient Resources for vitamins: -none  Calcium supplement: none  Fat-soluble vitamin levels: low vitamin D on last draw  Lab Results   Component Value Date    VITAMINA 42.4 01/18/2020    VITAMINA 44.4 11/19/2016     Lab Results   Component Value Date    CRP <4.0 11/24/2020    CRP <4.0 06/28/2020    CRP 3 01/18/2020     Lab Results   Component Value Date    VITDTOTAL 27.9 03/19/2017     No results found for: VITAME  Lab Results   Component Value Date    PT 20.7 (H) 06/28/2020    PT 18.7 (H) 01/29/2019    PT 17.6 (H) 11/18/2018     Lab Results   Component Value Date    DESGCARBPT 0.2 03/20/2017     No results found for: PIVKAII    Bone Health: Abnormal vitamin D level, being treated. Adequate calcium intake from diet. Due for DEXA next year. Normal bone density July 2018.     CF Related Diabetes: yes, on insulin    Lab Results   Component Value Date    GLUF 178 (H) 01/18/2020     No results found for: GLUCOSE2HR  Lab Results   Component Value Date    A1C 9.4 (H) 01/18/2020    A1C 8.8 (H) 09/29/2019    A1C 8.5 (H) 08/12/2018     Jackqulyn Livings MPH, RD, LDN  Pager: [phone]

## 2020-11-26 LAB — CBC W/ AUTO DIFF
BASOPHILS ABSOLUTE COUNT: 0 10*9/L (ref 0.0–0.1)
BASOPHILS RELATIVE PERCENT: 0.6 %
EOSINOPHILS ABSOLUTE COUNT: 0.2 10*9/L (ref 0.0–0.5)
EOSINOPHILS RELATIVE PERCENT: 4.6 %
HEMATOCRIT: 37.1 % — ABNORMAL LOW (ref 39.0–48.0)
HEMOGLOBIN: 12.9 g/dL (ref 12.9–16.5)
LYMPHOCYTES ABSOLUTE COUNT: 0.6 10*9/L — ABNORMAL LOW (ref 1.1–3.6)
LYMPHOCYTES RELATIVE PERCENT: 12.8 %
MEAN CORPUSCULAR HEMOGLOBIN CONC: 34.8 g/dL (ref 32.0–36.0)
MEAN CORPUSCULAR HEMOGLOBIN: 29.7 pg (ref 25.9–32.4)
MEAN CORPUSCULAR VOLUME: 85.3 fL (ref 77.6–95.7)
MEAN PLATELET VOLUME: 7.6 fL (ref 6.8–10.7)
MONOCYTES ABSOLUTE COUNT: 0.6 10*9/L (ref 0.3–0.8)
MONOCYTES RELATIVE PERCENT: 13.1 %
NEUTROPHILS ABSOLUTE COUNT: 3.1 10*9/L (ref 1.8–7.8)
NEUTROPHILS RELATIVE PERCENT: 68.9 %
NUCLEATED RED BLOOD CELLS: 0 /100{WBCs} (ref <=4)
PLATELET COUNT: 242 10*9/L (ref 150–450)
RED BLOOD CELL COUNT: 4.35 10*12/L (ref 4.26–5.60)
RED CELL DISTRIBUTION WIDTH: 14.4 % (ref 12.2–15.2)
WBC ADJUSTED: 4.5 10*9/L (ref 3.6–11.2)

## 2020-11-26 LAB — COMPREHENSIVE METABOLIC PANEL
ALBUMIN: 3.5 g/dL (ref 3.4–5.0)
ALKALINE PHOSPHATASE: 74 U/L (ref 46–116)
ALT (SGPT): 32 U/L (ref 10–49)
ANION GAP: 5 mmol/L (ref 5–14)
AST (SGOT): 19 U/L (ref <=34)
BILIRUBIN TOTAL: 0.5 mg/dL (ref 0.3–1.2)
BLOOD UREA NITROGEN: 14 mg/dL (ref 9–23)
BUN / CREAT RATIO: 21
CALCIUM: 8.5 mg/dL — ABNORMAL LOW (ref 8.7–10.4)
CHLORIDE: 101 mmol/L (ref 98–107)
CO2: 30.8 mmol/L (ref 20.0–31.0)
CREATININE: 0.66 mg/dL
EGFR CKD-EPI AA MALE: >90 mL/min/{1.73_m2} (ref >=60)
EGFR CKD-EPI NON-AA MALE: >90 mL/min/{1.73_m2} (ref >=60)
GLUCOSE RANDOM: 166 mg/dL (ref 70–179)
POTASSIUM: 3.6 mmol/L (ref 3.4–4.8)
PROTEIN TOTAL: 6.5 g/dL (ref 5.7–8.2)
SODIUM: 137 mmol/L (ref 135–145)

## 2020-11-26 LAB — TOBRAMYCIN LEVEL, RANDOM: TOBRAMYCIN RANDOM: 0.7 ug/mL

## 2020-11-26 LAB — MAGNESIUM: MAGNESIUM: 1.6 mg/dL (ref 1.6–2.6)

## 2020-11-26 LAB — PROTIME-INR
INR: 1.45
PROTIME: 16.9 s — ABNORMAL HIGH (ref 10.3–13.4)

## 2020-11-26 LAB — TOBRAMYCIN LEVEL, PEAK: TOBRAMYCIN PEAK: 6.9 ug/mL (ref 4.0–10.0)

## 2020-11-26 MED ADMIN — melatonin tablet 10.5 mg: 10 mg | ORAL | @ 01:00:00

## 2020-11-26 MED ADMIN — sodium chloride 7% nebulizer solution 4 mL: 4 mL | RESPIRATORY_TRACT | @ 18:00:00

## 2020-11-26 MED ADMIN — lamoTRIgine (LaMICtal) tablet 200 mg: 200 mg | ORAL | @ 14:00:00

## 2020-11-26 MED ADMIN — elexacaftor-tezacaftor-ivacaft (TRIKAFTA) tablet 2 tablet **Patient Supplied**: 2 | ORAL | @ 13:00:00

## 2020-11-26 MED ADMIN — traZODone (DESYREL) tablet 100 mg: 100 mg | ORAL | @ 01:00:00

## 2020-11-26 MED ADMIN — rivaroxaban (XARELTO) tablet 20 mg: 20 mg | ORAL | @ 22:00:00

## 2020-11-26 MED ADMIN — insulin lispro (HumaLOG) injection 0-12 Units: 0-12 [IU] | SUBCUTANEOUS | @ 04:00:00

## 2020-11-26 MED ADMIN — sodium chloride 7% nebulizer solution 4 mL: 4 mL | RESPIRATORY_TRACT | @ 12:00:00

## 2020-11-26 MED ADMIN — piperacillin-tazobactam (ZOSYN) IVPB (premix) 4.5 g: 4.5 g | INTRAVENOUS | @ 16:00:00 | Stop: 2020-12-09

## 2020-11-26 MED ADMIN — piperacillin-tazobactam (ZOSYN) IVPB (premix) 4.5 g: 4.5 g | INTRAVENOUS | @ 12:00:00 | Stop: 2020-12-09

## 2020-11-26 MED ADMIN — fluticasone furoate-vilanteroL (BREO ELLIPTA) 200-25 mcg/dose inhaler 1 puff: 1 | RESPIRATORY_TRACT | @ 13:00:00

## 2020-11-26 MED ADMIN — sodium chloride 7% nebulizer solution 4 mL: 4 mL | RESPIRATORY_TRACT | @ 01:00:00

## 2020-11-26 MED ADMIN — atorvastatin (LIPITOR) tablet 20 mg: 20 mg | ORAL | @ 01:00:00

## 2020-11-26 MED ADMIN — elexacaftor-tezacaftor-ivacaft (TRIKAFTA) tablet 1 tablet: 1 | ORAL | @ 01:00:00

## 2020-11-26 MED ADMIN — MORPhine 4 mg/mL injection 4 mg: 4 mg | INTRAVENOUS | @ 03:00:00 | Stop: 2020-11-25

## 2020-11-26 MED ADMIN — amitriptyline (ELAVIL) tablet 100 mg: 100 mg | ORAL | @ 01:00:00

## 2020-11-26 MED ADMIN — tobramycin (NEBCIN) 260 mg in sodium chloride (NS) 0.9 % 100 mL IVPB: 260 mg | INTRAVENOUS | @ 02:00:00 | Stop: 2020-12-01

## 2020-11-26 MED ADMIN — pantoprazole (PROTONIX) EC tablet 20 mg: 20 mg | ORAL | @ 13:00:00

## 2020-11-26 MED ADMIN — insulin lispro (HumaLOG) injection 0-12 Units: 0-12 [IU] | SUBCUTANEOUS | @ 16:00:00

## 2020-11-26 MED ADMIN — oxyCODONE (ROXICODONE) immediate release tablet 5 mg: 5 mg | ORAL | @ 13:00:00 | Stop: 2020-12-08

## 2020-11-26 MED ADMIN — fluticasone propionate (FLONASE) 50 mcg/actuation nasal spray 1 spray: 1 | NASAL | @ 13:00:00

## 2020-11-26 MED ADMIN — tobramycin (NEBCIN) 260 mg in sodium chloride (NS) 0.9 % 100 mL IVPB: 260 mg | INTRAVENOUS | @ 13:00:00 | Stop: 2020-12-01

## 2020-11-26 MED ADMIN — insulin glargine (LANTUS) injection 50 Units: 50 [IU] | SUBCUTANEOUS | @ 03:00:00

## 2020-11-26 MED ADMIN — MORPhine injection 2 mg: 2 mg | INTRAVENOUS | @ 19:00:00 | Stop: 2020-11-26

## 2020-11-26 MED ADMIN — pediatric multivitamin-vit D3 1,500 unit-vit K 800 mcg (MVW COMPLETE FORMULATION) capsule: 1 | ORAL | @ 13:00:00

## 2020-11-26 MED ADMIN — cetirizine (ZyrTEC) tablet 10 mg: 10 mg | ORAL | @ 13:00:00

## 2020-11-26 MED ADMIN — lipase-protease-amylase (CREON) 36,000-114,000- 180,000 unit capsule 288,000 units of lipase **PATIENT-SUPPLIED**: 8 | ORAL | @ 22:00:00

## 2020-11-26 MED ADMIN — insulin lispro (HumaLOG) injection 0-12 Units: 0-12 [IU] | SUBCUTANEOUS | @ 12:00:00

## 2020-11-26 MED ADMIN — lipase-protease-amylase (CREON) 36,000-114,000- 180,000 unit capsule 288,000 units of lipase **PATIENT-SUPPLIED**: 8 | ORAL | @ 13:00:00

## 2020-11-26 MED ADMIN — albuterol nebulizer solution 2.5 mg: 2.5 mg | RESPIRATORY_TRACT | @ 12:00:00

## 2020-11-26 MED ADMIN — albuterol nebulizer solution 2.5 mg: 2.5 mg | RESPIRATORY_TRACT | @ 01:00:00

## 2020-11-26 MED ADMIN — lipase-protease-amylase (CREON) 36,000-114,000- 180,000 unit capsule 288,000 units of lipase **PATIENT-SUPPLIED**: 8 | ORAL | @ 16:00:00

## 2020-11-26 MED ADMIN — gabapentin (NEURONTIN) capsule 600 mg: 600 mg | ORAL | @ 12:00:00

## 2020-11-26 MED ADMIN — piperacillin-tazobactam (ZOSYN) IVPB (premix) 4.5 g: 4.5 g | INTRAVENOUS | @ 04:00:00 | Stop: 2020-12-09

## 2020-11-26 MED ADMIN — piperacillin-tazobactam (ZOSYN) IVPB (premix) 4.5 g: 4.5 g | INTRAVENOUS | @ 23:00:00 | Stop: 2020-12-09

## 2020-11-26 MED ADMIN — dornase alfa (PULMOZYME) 1 mg/mL solution 2.5 mg: 2.5 mg | RESPIRATORY_TRACT | @ 12:00:00

## 2020-11-26 MED ADMIN — phytonadione (vitamin K1) (MEPHYTON) tablet 5 mg: 5 mg | ORAL | @ 19:00:00 | Stop: 2020-12-01

## 2020-11-26 MED ADMIN — pramipexole (MIRAPEX) tablet 0.5 mg: .5 mg | ORAL | @ 22:00:00

## 2020-11-26 MED ADMIN — insulin lispro (HumaLOG) injection 0-12 Units: 0-12 [IU] | SUBCUTANEOUS | @ 22:00:00

## 2020-11-26 MED ADMIN — pediatric multivitamin-vit D3 1,500 unit-vit K 800 mcg (MVW COMPLETE FORMULATION) capsule: 1 | ORAL | @ 01:00:00

## 2020-11-26 MED ADMIN — dornase alfa (PULMOZYME) 1 mg/mL solution 2.5 mg: 2.5 mg | RESPIRATORY_TRACT | @ 01:00:00

## 2020-11-26 MED ADMIN — potassium chloride (KLOR-CON) CR tablet 40 mEq: 40 meq | ORAL | @ 19:00:00 | Stop: 2020-11-26

## 2020-11-26 MED ADMIN — FLUoxetine (PROzac) capsule 60 mg: 60 mg | ORAL | @ 13:00:00

## 2020-11-26 MED ADMIN — famotidine (PEPCID) tablet 20 mg: 20 mg | ORAL | @ 13:00:00

## 2020-11-26 MED ADMIN — albuterol nebulizer solution 2.5 mg: 2.5 mg | RESPIRATORY_TRACT | @ 18:00:00

## 2020-11-26 NOTE — Unmapped (Addendum)
Hospitalist Daily Progress Note     LOS: 2 days       Assessment/Plan:  Principal Problem:    Cystic fibrosis with pulmonary exacerbation (CMS-HCC)  Active Problems:    Essential hypertension    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)    Pancreatic insufficiency due to cystic fibrosis (CMS-HCC)    Chronic deep vein thrombosis (DVT) of lower extremity (CMS-HCC)  Resolved Problems:    * No resolved hospital problems. *         Christian Young is a 35 y.o. male with PMHx of CF who presented to [facility] with Cystic fibrosis with pulmonary exacerbation (CMS-HCC).  ??  *Acute exacerbation of CF bronchiectasis: presents with progressively worsening dry cough as well as chest pain, subjective fevers, and night sweats.  CXR with no consolidation.      - Pulmonology consult in place, appreciated.  INR reviewed, vitamin K ordered with follow-up INR.  Magnesium ordered every other day, today's result pending.  - MRSA screen is negative.  Specimen for CF sputum culture has yet to be collected.  - CXR PA and lateral consistent with known prior right lobectomy.  - Zosyn 4.5 mg q6h - Note: patient has had cefepime allergy and developed AIN from Zosyn.  Will need to monitor closely, CMP and CBC daily.  - Tobramycin IV, pharmacy to dose  - Plan for at least 14 days of antibiotics, no allergy, no diarrhea reported at this time.  - Continue Trikafta (non-formulary), Breo (formulary alternative for Symbicort) and Pulmozyme  - Airway clearance with albuterol TID, 7% hypertonic saline, chest vest, and [place]  - Patient will require PFTs while in hospital (not yet ordered), timing to be determined based upon clinical condition and duration of antibiotic therapy.  ??  Addendum: Content by nursing, patient complained of worsening anterior and bilateral chest pain.  He is hemodynamically stable.  The patient requests a one-time dose of morphine.  He is hemodynamically stable, oxygen saturation normal on room air.  Will obtain expedited 2 view chest.  One-time 2 mg IV morphine ordered.  The patient does have oral oxycodone 5 mg ordered every 4 hours.    PA and lateral film of chest now available, I do not see pneumothorax.  Final report pending.  We will continue with pain management.  He is anticoagulated, so would avoid NSAIDs for the treatment of inflammatory pain as this may represent  pleuritic chest pain.    *Diabetes mellitus related to cystic fibrosis:  - SSI + Accuchecks ACHS   - Lantus 50 units nightly, blood sugar 346 this morning.  The patient is on a high-calorie high-protein diet.  If his sugars do not improve, the first step may be dietary counseling regarding intake of simple carbohydrates.  Labs today did not show signs of frank metabolic derangement or volume contraction.  Potassium 3.6, will replete orally, this may also be albuterol effect and membrane transport secondary to insulin in the setting of some elevated blood sugars.  Potassium checked daily with CMP.  ??  *Pancreatic insufficiency due to CF:  - Continue Creon 8 capsules TID with meals and 4 capsules prn snacks  - Vitamins ADEK  ??  *History of DVT: continue Xarelto 20 mg daily.  No abnormal bleeding reported.  ??  CARE CHECK LIST:  - FEN/GI: regular diet, IVFs prn  - PPx: home Xarelto  - Code: Full  ___________________________________________________________________  Scheduled Meds:  ??? albuterol  2.5 mg Nebulization TID   ???  amitriptyline  100 mg Oral Nightly   ??? atorvastatin  20 mg Oral Nightly   ??? cetirizine  10 mg Oral Daily   ??? dornase alfa  2.5 mg Inhalation BID   ??? elexacaftor-tezacaftor-ivacaft  2 tablet Oral Daily    And   ??? elexacaftor-tezacaftor-ivacaft  1 tablet Oral At bedtime   ??? famotidine  20 mg Oral Daily   ??? FLUoxetine  60 mg Oral Daily   ??? fluticasone furoate-vilanteroL  1 puff Inhalation Daily (RT)   ??? fluticasone propionate  1 spray Each Nare Daily   ??? gabapentin  600 mg Oral Daily   ??? insulin glargine  50 Units Subcutaneous Nightly ??? insulin lispro  0-12 Units Subcutaneous ACHS   ??? lamoTRIgine  200 mg Oral Daily   ??? lipase-protease-amylase  8 capsule Oral 3xd Meals   ??? lisinopriL  10 mg Oral Daily   ??? melatonin  10.5 mg Oral Nightly   ??? pantoprazole  20 mg Oral Daily   ??? MVW Complete (pediatric multivit 61-D3-vit K)  1 capsule Oral BID   ??? phytonadione (vitamin K1)  5 mg Oral Daily   ??? piperacillin-tazobactam (ZOSYN) IV (intermittent)  4.5 g Intravenous Q6H   ??? pramipexole  0.5 mg Oral Q PM   ??? rivaroxaban  20 mg Oral Daily   ??? sodium chloride 7%  4 mL Nebulization TID   ??? tobramycin (NEBCIN) IVPB (conventional dosing)  260 mg Intravenous Q12H   ??? traZODone  100 mg Oral Nightly     Continuous Infusions:  PRN Meds:.acetaminophen, albuterol, dextrose in water, lipase-protease-amylase, ondansetron, oxyCODONE, senna    *DVT prophylaxis: He is fully ambulatory.    *Disposition: At this time, the plan is to remain in house for at least 14 days of intravenous antibiotics      Please page the [facility]) pager at [phone] with questions.      Consultants:   1.  Pulmonology      Subjective:   No complaint of fever, chills.  Feeling a little better since getting into the hospital.    Objective:       Vital signs in last 24 hours:  Temp:  [36.7 ??C (98.1 ??F)-37.2 ??C (99 ??F)] 36.7 ??C (98.1 ??F)  Heart Rate:  [71-97] 75  Resp:  [16-18] 18  BP: (96-118)/(68-77) (P) 107/68  MAP (mmHg):  [78-88] (P) 80  SpO2:  [92 %-97 %] 97 %      Physical Exam: Vitals as above.  General:??chronically ill??appearing??male??in NAD, looks mildly weak, not in any respiratory difficulty, no increased work of breathing.  HEENT: anicteric sclera, moist mucous membranes  CV: RRR, no murmurs, rubs, or gallops  Pulm: normal work of breathing, Good air movement.   Few crackles on right.  Abd: soft, non-tender, non-distended  Ext: No cyanosis, clubbing or edema.  Neuro: Speech fluent.  No acute focal motor deficits.  Psych: Friendly, interactive, answers all questions appropriately.    Medications:   Scheduled Meds:  ??? albuterol  2.5 mg Nebulization TID   ??? amitriptyline  100 mg Oral Nightly   ??? atorvastatin  20 mg Oral Nightly   ??? cetirizine  10 mg Oral Daily   ??? dornase alfa  2.5 mg Inhalation BID   ??? elexacaftor-tezacaftor-ivacaft  2 tablet Oral Daily    And   ??? elexacaftor-tezacaftor-ivacaft  1 tablet Oral At bedtime   ??? famotidine  20 mg Oral Daily   ??? FLUoxetine  60 mg Oral Daily   ???  fluticasone furoate-vilanteroL  1 puff Inhalation Daily (RT)   ??? fluticasone propionate  1 spray Each Nare Daily   ??? gabapentin  600 mg Oral Daily   ??? insulin glargine  50 Units Subcutaneous Nightly   ??? insulin lispro  0-12 Units Subcutaneous ACHS   ??? lamoTRIgine  200 mg Oral Daily   ??? lipase-protease-amylase  8 capsule Oral 3xd Meals   ??? lisinopriL  10 mg Oral Daily   ??? melatonin  10.5 mg Oral Nightly   ??? pantoprazole  20 mg Oral Daily   ??? MVW Complete (pediatric multivit 61-D3-vit K)  1 capsule Oral BID   ??? phytonadione (vitamin K1)  5 mg Oral Daily   ??? piperacillin-tazobactam (ZOSYN) IV (intermittent)  4.5 g Intravenous Q6H   ??? pramipexole  0.5 mg Oral Q PM   ??? rivaroxaban  20 mg Oral Daily   ??? sodium chloride 7%  4 mL Nebulization TID   ??? tobramycin (NEBCIN) IVPB (conventional dosing)  260 mg Intravenous Q12H   ??? traZODone  100 mg Oral Nightly     Continuous Infusions:    Lab Results   Component Value Date    WBC 4.5 11/26/2020    HGB 12.9 11/26/2020    HCT 37.1 (L) 11/26/2020    PLT 242 11/26/2020       Lab Results   Component Value Date    NA 137 11/26/2020    K 3.6 11/26/2020    CL 101 11/26/2020    CO2 30.8 11/26/2020    BUN 14 11/26/2020    CREATININE 0.66 11/26/2020    GLU 166 11/26/2020    CALCIUM 8.5 (L) 11/26/2020    MG 1.7 07/11/2020    PHOS 3.9 08/12/2018       Lab Results   Component Value Date    BILITOT 0.5 11/26/2020    BILIDIR 0.20 07/04/2020    PROT 6.5 11/26/2020    ALBUMIN 3.5 11/26/2020    ALT 32 11/26/2020    AST 19 11/26/2020    ALKPHOS 74 11/26/2020       Lab Results Component Value Date    PT 16.9 (H) 11/26/2020    INR 1.45 11/26/2020     Recent Labs     11/24/20  1253 11/25/20  0626 11/26/20  0625   NA 133*   < > 137   K 4.2   < > 3.6   CL 98   < > 101   CO2 24.6   < > 30.8   BUN 16   < > 14   CREATININE 0.61   < > 0.66   GLU 362*   < > 166   CALCIUM 9.5   < > 8.5*   PROT 7.4   < > 6.5   BILITOT 0.4   < > 0.5   AST 26   < > 19   ALT 39   < > 32   ALKPHOS 77   < > 74   ESR 10  --   --    CRP <4.0  --   --     < > = values in this interval not displayed.     Recent Labs     11/24/20  1253 11/26/20  0838   TROPONINI <3  --    INR  --  1.45     Pending Labs     Order Current Status    Blood Culture #1 Preliminary result    Blood  Culture #2 Preliminary result      Blood cultures negative at 24 hours.    35 minutes, over 50% spent in counseling and coordination of care.  Tawni Levy MD

## 2020-11-26 NOTE — Unmapped (Signed)
Problem: Airway Clearance Ineffective  Goal: Effective Airway Clearance  Outcome: Progressing  Note: Pt using home chest vest independently      Problem: Respiratory Compromise (Cystic Fibrosis)  Goal: Effective Oxygenation and Ventilation  Outcome: Progressing     Problem: Adult Inpatient Plan of Care  Goal: Plan of Care Review  Outcome: Progressing     Pt remains on RA, inhaled medications given as ordered, encouraged deep breathing and coughing, pt using home chest vest for mucus clearance, no complications or distress noted this shift

## 2020-11-26 NOTE — Unmapped (Signed)
Patient is alert and oriented x 4, self care. Vss, afebrile, no s/s of infection. Patient c/o back and chest pain oxycodoen and IV morphine given for pain management. Left chest wall port C, D, I. Continues on IV abx and scheduled NEB treatments. No concerns. will continue with the plan of care.   Problem: Adult Inpatient Plan of Care  Goal: Plan of Care Review  Outcome: Progressing  Goal: Patient-Specific Goal (Individualized)  Outcome: Progressing  Goal: Absence of Hospital-Acquired Illness or Injury  Outcome: Progressing  Intervention: Identify and Manage Fall Risk  Recent Flowsheet Documentation  Taken 11/25/2020 2000 by Mayo Ao, RN  Safety Interventions: fall reduction program maintained  Intervention: Prevent and Manage VTE (Venous Thromboembolism) Risk  Recent Flowsheet Documentation  Taken 11/25/2020 2000 by Andrey Farmer Marlow-Freeman, RN  Activity Management: activity adjusted per tolerance  Goal: Optimal Comfort and Wellbeing  Outcome: Progressing  Goal: Readiness for Transition of Care  Outcome: Progressing  Goal: Rounds/Family Conference  Outcome: Progressing     Problem: Infection  Goal: Absence of Infection Signs and Symptoms  Outcome: Progressing  Intervention: Prevent or Manage Infection  Recent Flowsheet Documentation  Taken 11/25/2020 2000 by Andrey Farmer Marlow-Freeman, RN  Isolation Precautions: contact precautions maintained     Problem: Adjustment to Illness (Cystic Fibrosis)  Goal: Optimal Coping  Outcome: Progressing     Problem: Infection (Cystic Fibrosis)  Goal: Absence of Infection Signs and Symptoms  Outcome: Progressing  Intervention: Manage Infection and Prevent Transmission  Recent Flowsheet Documentation  Taken 11/25/2020 2000 by Andrey Farmer Marlow-Freeman, RN  Isolation Precautions: contact precautions maintained     Problem: Oral Intake Inadequate (Cystic Fibrosis)  Goal: Optimal Nutrition Intake  Outcome: Progressing     Problem: Malabsorption (Cystic Fibrosis)  Goal: Optimal Bowel Elimination  Outcome: Progressing     Problem: Respiratory Compromise (Cystic Fibrosis)  Goal: Effective Oxygenation and Ventilation  Outcome: Progressing     Problem: Airway Clearance Ineffective  Goal: Effective Airway Clearance  Outcome: Progressing  Intervention: Promote Airway Secretion Clearance  Recent Flowsheet Documentation  Taken 11/25/2020 2000 by Andrey Farmer Marlow-Freeman, RN  Activity Management: activity adjusted per tolerance

## 2020-11-26 NOTE — Unmapped (Signed)
Aminoglycoside Therapeutic Monitoring Pharmacy Note    Christian Young is a 35 y.o. male continuing tobramycin. Date of therapy initiation: 11/24/20    Indication: CF exacerbation    Prior Dosing Information: Current regimen 260 mg IV q 12 hours      Goals:  Therapeutic Drug Levels  ?? Trough level: tobramycin <1 mg/L  ?? Peak level: tobramycin 10-14 mg/L    Additional Clinical Monitoring/Outcomes  Renal function, volume status (intake and output)    Results:   ?? Trough level: 0.7  mg/L, drawn 9 hrs post end of infusion  (Extrapolated trough: 0.48  mg/L)   ?? Peak level: 6.9  mg/L, drawn 0.5 hours post infusion  (Extrapolated Peak: 7.8  mg/L)    Wt Readings from Last 1 Encounters:   11/24/20 (!) 101.3 kg (223 lb 4.8 oz)     Lab Results   Component Value Date    CREATININE 0.66 11/26/2020       Pharmacokinetic Considerations and Significant Drug Interactions:  ??? Adult (calculated on 11/26/20): Vd = 31.2  L, ke = 0.254  hr-1  ??? Concurrent nephrotoxic meds: Not applicable    Assessment/Plan:  Recommendation(s)  ??? Continue current regimen of 260 mg IV q 12 hours   ??? Estimated peak and trough on recommended regimen: peak = 7.7  mg/L, trough = 0.5  mg/L    Follow-up  ??? Level due: no more levels required.    ??? A pharmacist will continue to monitor and order levels as appropriate    Please page service pharmacist with questions/clarifications.    Burnard Bunting, PharmD

## 2020-11-26 NOTE — Unmapped (Signed)
PULMONARY CONSULT  NOTE      Patient: Christian Young(Oct 30, 1985)  Reason for consultation: Christian Young is a 35 y.o. male who is seen in consultation at the request of Rica Koyanagi, MD for comprehensive evaluation of CF exacerbation.    Assessment and Recommendations:      Principal Problem:    Cystic fibrosis with pulmonary exacerbation (CMS-HCC)  Active Problems:    Essential hypertension    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)    Pancreatic insufficiency due to cystic fibrosis (CMS-HCC)    Chronic deep vein thrombosis (DVT) of lower extremity (CMS-HCC)  Resolved Problems:    * No resolved hospital problems. *    Christian Young is a 35 y.o. male with PMHx of CF who presented to [facility] with Cystic fibrosis with pulmonary exacerbation (CMS-HCC).  ??  *Acute exacerbation of CF bronchiectasis: Pt recommended to purdue admission by his primary CF doc.  He has grown MSSA and 2 variants of PSAS (one pan resistant and one with intermediate resistance)  - CF sputum/culture  - CXR PA and lateral with sequela of RUL lobectomy and chronic scarring in R base  - Zosyn 4.5 mg q6h - Note: patient has had cefepime allergy and developed AIN from Zosyn.  Will need to monitor closely  - Tobramycin IV, pharmacy to dose - Please check Mg with BMP while on Tobramycin  - MRSA screen/ cx pending.  - Plan for at least 14 days of antibiotics.  Will likely need to be completed as an inpt.  - Continue Trikafta (non-formulary), Breo (formulary alternative for Symbicort) and Pulmozyme  - Airway clearance with albuterol TID, 7% hypertonic saline, chest vest, and aerobika  - Monitor daily CBC and CMP  - Patient will require PFTs while in hospital.  Ordered for 4/19  - Added INR, if normal please order vit K 5mg  po 2x/week while on abx. If abnormal please order 5mg  PO daily x5 days then recheck INR      We appreciate the opportunity to assist in the care of this patient.  Please page [phone] with any questions.    Christian Riis Kadir Azucena, MD    Subjective:      History of Present Illness:  Christian Young is a 34 y.o. male admitted for CF exacerbation.  Has had recent pleuritic chest pain and fever.  Recommended by primary CF doc to present for admission and IV Abx.     Review of Systems: A comprehensive review of systems was performed and was negative except as above in HPI.  Past Medical History:   Diagnosis Date   ??? Anxiety    ??? Chronic pain disorder    ??? Cystic fibrosis (CMS-HCC)    ??? Depression    ??? Hypertension    ??? Nonproductive cough 04/05/2018     Past Surgical History:   Procedure Laterality Date   ??? IR INSERT PORT AGE GREATER THAN 5 YRS  03/27/2019    IR INSERT PORT AGE GREATER THAN 5 YRS 03/27/2019 Rush Barer, MD IMG VIR [facility]   ??? PR REMOVAL OF LUNG,LOBECTOMY Right 03/29/2017    Procedure: REMOVAL OF LUNG, OTHER THAN PNEUMONECTOMY; SINGLE LOBE (LOBECTOMY);  Surgeon: Cherie Dark, MD;  Location: MAIN OR [facility];  Service: Thoracic     Medications reviewed in Epic  Allergies as of 11/24/2020 - Reviewed 11/24/2020   Allergen Reaction Noted   ??? Aztreonam Anaphylaxis, Hives, Nausea And Vomiting, and Rash 09/06/2015   ??? [place] [  aztreonam lysine] Anaphylaxis 12/27/2016   ??? Cefepime Itching, Nausea Only, and Other (See Comments) 09/06/2015   ??? Other Anaphylaxis and Other (See Comments) 09/06/2015   ??? Slo-bid 100 Anaphylaxis 06/20/2017   ??? Tobramycin Tinnitus 09/06/2015   ??? Banana Itching and Nausea And Vomiting 07/29/2016     Family History   Problem Relation Age of Onset   ??? Bipolar disorder Mother    ??? Depression Mother      Social History     Tobacco Use   ??? Smoking status: Former Smoker     Types: e-Cigarettes   ??? Smokeless tobacco: Never Used   Substance Use Topics   ??? Alcohol use: Not Currently     Alcohol/week: 3.0 standard drinks     Types: 3 Glasses of wine per week        Objective:      Physical Exam:  Vitals:    11/25/20 1800 11/25/20 2047 11/25/20 2123 11/26/20 0630   BP: 118/74 116/77  96/68 Pulse: 97 95 71 75   Resp: 18 18 16 18    Temp: 36.7 ??C 37.2 ??C  36.7 ??C   TempSrc: Oral Oral  Oral   SpO2: 97% 95% 92% 97%   Weight:       Height:           General: chronically ill appearing male in NAD  HEENT: anicteric sclera, moist mucous membranes  Neck: supple, no palpable LAD  CV: RRR, no murmurs, rubs, or gallops  Pulm: normal work of breathing, Good air movement.  Small crackles on right  Abd: soft, non-tender, non-distended  Ext: warm and well perfused, no edema  Neuro: grossly intact  Psych: appropriate mood and affect    Malnutrition Assessment by RD:          Diagnostic Review:   All labs and images were personally reviewed.

## 2020-11-27 LAB — CBC W/ AUTO DIFF
BASOPHILS ABSOLUTE COUNT: 0.1 10*9/L (ref 0.0–0.1)
BASOPHILS RELATIVE PERCENT: 1.3 %
EOSINOPHILS ABSOLUTE COUNT: 0.3 10*9/L (ref 0.0–0.5)
EOSINOPHILS RELATIVE PERCENT: 5.4 %
HEMATOCRIT: 36.6 % — ABNORMAL LOW (ref 39.0–48.0)
HEMOGLOBIN: 12.6 g/dL — ABNORMAL LOW (ref 12.9–16.5)
LYMPHOCYTES ABSOLUTE COUNT: 1.1 10*9/L (ref 1.1–3.6)
LYMPHOCYTES RELATIVE PERCENT: 23.5 %
MEAN CORPUSCULAR HEMOGLOBIN CONC: 34.6 g/dL (ref 32.0–36.0)
MEAN CORPUSCULAR HEMOGLOBIN: 29.3 pg (ref 25.9–32.4)
MEAN CORPUSCULAR VOLUME: 84.7 fL (ref 77.6–95.7)
MEAN PLATELET VOLUME: 7.8 fL (ref 6.8–10.7)
MONOCYTES ABSOLUTE COUNT: 0.5 10*9/L (ref 0.3–0.8)
MONOCYTES RELATIVE PERCENT: 11 %
NEUTROPHILS ABSOLUTE COUNT: 2.7 10*9/L (ref 1.8–7.8)
NEUTROPHILS RELATIVE PERCENT: 58.8 %
NUCLEATED RED BLOOD CELLS: 0 /100{WBCs} (ref <=4)
PLATELET COUNT: 258 10*9/L (ref 150–450)
RED BLOOD CELL COUNT: 4.32 10*12/L (ref 4.26–5.60)
RED CELL DISTRIBUTION WIDTH: 14.4 % (ref 12.2–15.2)
WBC ADJUSTED: 4.6 10*9/L (ref 3.6–11.2)

## 2020-11-27 LAB — COMPREHENSIVE METABOLIC PANEL
ALBUMIN: 3.6 g/dL (ref 3.4–5.0)
ALKALINE PHOSPHATASE: 70 U/L (ref 46–116)
ALT (SGPT): 36 U/L (ref 10–49)
ANION GAP: 5 mmol/L (ref 5–14)
AST (SGOT): 21 U/L (ref <=34)
BILIRUBIN TOTAL: 0.4 mg/dL (ref 0.3–1.2)
BLOOD UREA NITROGEN: 10 mg/dL (ref 9–23)
BUN / CREAT RATIO: 15
CALCIUM: 8.7 mg/dL (ref 8.7–10.4)
CHLORIDE: 100 mmol/L (ref 98–107)
CO2: 31.2 mmol/L — ABNORMAL HIGH (ref 20.0–31.0)
CREATININE: 0.65 mg/dL
EGFR CKD-EPI AA MALE: >90 mL/min/{1.73_m2} (ref >=60)
EGFR CKD-EPI NON-AA MALE: >90 mL/min/{1.73_m2} (ref >=60)
GLUCOSE RANDOM: 198 mg/dL — ABNORMAL HIGH (ref 70–179)
POTASSIUM: 3.7 mmol/L (ref 3.4–4.8)
PROTEIN TOTAL: 6.5 g/dL (ref 5.7–8.2)
SODIUM: 136 mmol/L (ref 135–145)

## 2020-11-27 LAB — MAGNESIUM: MAGNESIUM: 1.6 mg/dL (ref 1.6–2.6)

## 2020-11-27 MED ADMIN — oxyCODONE (ROXICODONE) immediate release tablet 5 mg: 5 mg | ORAL | @ 14:00:00 | Stop: 2020-12-08

## 2020-11-27 MED ADMIN — tobramycin (NEBCIN) 260 mg in sodium chloride (NS) 0.9 % 100 mL IVPB: 260 mg | INTRAVENOUS | @ 17:00:00 | Stop: 2020-12-01

## 2020-11-27 MED ADMIN — fluticasone furoate-vilanteroL (BREO ELLIPTA) 200-25 mcg/dose inhaler 1 puff: 1 | RESPIRATORY_TRACT | @ 13:00:00

## 2020-11-27 MED ADMIN — traZODone (DESYREL) tablet 100 mg: 100 mg | ORAL

## 2020-11-27 MED ADMIN — pramipexole (MIRAPEX) tablet 0.5 mg: .5 mg | ORAL | @ 21:00:00

## 2020-11-27 MED ADMIN — piperacillin-tazobactam (ZOSYN) IVPB (premix) 4.5 g: 4.5 g | INTRAVENOUS | @ 22:00:00 | Stop: 2020-12-09

## 2020-11-27 MED ADMIN — cetirizine (ZyrTEC) tablet 10 mg: 10 mg | ORAL | @ 14:00:00

## 2020-11-27 MED ADMIN — phytonadione (vitamin K1) (MEPHYTON) tablet 5 mg: 5 mg | ORAL | @ 14:00:00 | Stop: 2020-12-01

## 2020-11-27 MED ADMIN — sodium chloride 7% nebulizer solution 4 mL: 4 mL | RESPIRATORY_TRACT | @ 13:00:00

## 2020-11-27 MED ADMIN — lipase-protease-amylase (CREON) 36,000-114,000- 180,000 unit capsule 288,000 units of lipase **PATIENT-SUPPLIED**: 8 | ORAL | @ 11:00:00

## 2020-11-27 MED ADMIN — gabapentin (NEURONTIN) capsule 600 mg: 600 mg | ORAL | @ 14:00:00

## 2020-11-27 MED ADMIN — FLUoxetine (PROzac) capsule 60 mg: 60 mg | ORAL | @ 14:00:00

## 2020-11-27 MED ADMIN — elexacaftor-tezacaftor-ivacaft (TRIKAFTA) tablet 1 tablet: 1 | ORAL

## 2020-11-27 MED ADMIN — melatonin tablet 10.5 mg: 10 mg | ORAL

## 2020-11-27 MED ADMIN — magnesium sulfate 2gm/50mL IVPB: 2 g | INTRAVENOUS | @ 18:00:00 | Stop: 2020-11-27

## 2020-11-27 MED ADMIN — sodium chloride 7% nebulizer solution 4 mL: 4 mL | RESPIRATORY_TRACT | @ 19:00:00

## 2020-11-27 MED ADMIN — amitriptyline (ELAVIL) tablet 100 mg: 100 mg | ORAL

## 2020-11-27 MED ADMIN — pantoprazole (PROTONIX) EC tablet 20 mg: 20 mg | ORAL | @ 14:00:00

## 2020-11-27 MED ADMIN — insulin lispro (HumaLOG) injection 0-12 Units: 0-12 [IU] | SUBCUTANEOUS

## 2020-11-27 MED ADMIN — sodium chloride 7% nebulizer solution 4 mL: 4 mL | RESPIRATORY_TRACT | @ 01:00:00

## 2020-11-27 MED ADMIN — fluticasone propionate (FLONASE) 50 mcg/actuation nasal spray 1 spray: 1 | NASAL | @ 14:00:00

## 2020-11-27 MED ADMIN — albuterol nebulizer solution 2.5 mg: 2.5 mg | RESPIRATORY_TRACT | @ 13:00:00

## 2020-11-27 MED ADMIN — dornase alfa (PULMOZYME) 1 mg/mL solution 2.5 mg: 2.5 mg | RESPIRATORY_TRACT | @ 01:00:00

## 2020-11-27 MED ADMIN — pediatric multivitamin-vit D3 1,500 unit-vit K 800 mcg (MVW COMPLETE FORMULATION) capsule: 1 | ORAL

## 2020-11-27 MED ADMIN — insulin lispro (HumaLOG) injection 0-12 Units: 0-12 [IU] | SUBCUTANEOUS | @ 14:00:00

## 2020-11-27 MED ADMIN — lipase-protease-amylase (CREON) 36,000-114,000- 180,000 unit capsule 288,000 units of lipase **PATIENT-SUPPLIED**: 8 | ORAL | @ 21:00:00

## 2020-11-27 MED ADMIN — piperacillin-tazobactam (ZOSYN) IVPB (premix) 4.5 g: 4.5 g | INTRAVENOUS | @ 17:00:00 | Stop: 2020-12-09

## 2020-11-27 MED ADMIN — albuterol nebulizer solution 2.5 mg: 2.5 mg | RESPIRATORY_TRACT | @ 19:00:00

## 2020-11-27 MED ADMIN — lipase-protease-amylase (CREON) 36,000-114,000- 180,000 unit capsule 288,000 units of lipase **PATIENT-SUPPLIED**: 8 | ORAL | @ 17:00:00

## 2020-11-27 MED ADMIN — rivaroxaban (XARELTO) tablet 20 mg: 20 mg | ORAL | @ 21:00:00

## 2020-11-27 MED ADMIN — insulin lispro (HumaLOG) injection 0-12 Units: 0-12 [IU] | SUBCUTANEOUS | @ 17:00:00

## 2020-11-27 MED ADMIN — pediatric multivitamin-vit D3 1,500 unit-vit K 800 mcg (MVW COMPLETE FORMULATION) capsule: 1 | ORAL | @ 14:00:00

## 2020-11-27 MED ADMIN — lamoTRIgine (LaMICtal) tablet 200 mg: 200 mg | ORAL | @ 14:00:00

## 2020-11-27 MED ADMIN — piperacillin-tazobactam (ZOSYN) IVPB (premix) 4.5 g: 4.5 g | INTRAVENOUS | @ 04:00:00 | Stop: 2020-12-09

## 2020-11-27 MED ADMIN — elexacaftor-tezacaftor-ivacaft (TRIKAFTA) tablet 2 tablet **Patient Supplied**: 2 | ORAL | @ 14:00:00

## 2020-11-27 MED ADMIN — acetaminophen (TYLENOL) tablet 650 mg: 650 mg | ORAL

## 2020-11-27 MED ADMIN — insulin lispro (HumaLOG) injection 0-12 Units: 0-12 [IU] | SUBCUTANEOUS | @ 21:00:00

## 2020-11-27 MED ADMIN — lisinopriL (PRINIVIL,ZESTRIL) tablet 10 mg: 10 mg | ORAL | @ 14:00:00

## 2020-11-27 MED ADMIN — albuterol nebulizer solution 2.5 mg: 2.5 mg | RESPIRATORY_TRACT | @ 01:00:00

## 2020-11-27 MED ADMIN — oxyCODONE (ROXICODONE) immediate release tablet 5 mg: 5 mg | ORAL | @ 18:00:00 | Stop: 2020-12-08

## 2020-11-27 MED ADMIN — dornase alfa (PULMOZYME) 1 mg/mL solution 2.5 mg: 2.5 mg | RESPIRATORY_TRACT | @ 13:00:00

## 2020-11-27 MED ADMIN — insulin glargine (LANTUS) injection 50 Units: 50 [IU] | SUBCUTANEOUS

## 2020-11-27 MED ADMIN — oxyCODONE (ROXICODONE) immediate release tablet 5 mg: 5 mg | ORAL | Stop: 2020-12-08

## 2020-11-27 MED ADMIN — famotidine (PEPCID) tablet 20 mg: 20 mg | ORAL | @ 14:00:00

## 2020-11-27 MED ADMIN — piperacillin-tazobactam (ZOSYN) IVPB (premix) 4.5 g: 4.5 g | INTRAVENOUS | @ 11:00:00 | Stop: 2020-12-09

## 2020-11-27 MED ADMIN — atorvastatin (LIPITOR) tablet 20 mg: 20 mg | ORAL

## 2020-11-27 NOTE — Unmapped (Signed)
PULMONARY CONSULT  NOTE      Patient: Manfred Laspina Freitas(1985/09/08)  Reason for consultation: Mr. Hoak is a 35 y.o. male who is seen in consultation at the request of Rica Koyanagi, MD for comprehensive evaluation of CF exacerbation.      INTERVAL HISTORY     - Concern for small right apical PTX yesterday was a false positive, likely 2/2 previous RUL lobectomy. Absent on repeat CXR.  - Good AWC, no real sputum yet, still feeling about the same but in good spirits    Assessment and Recommendations:      Principal Problem:    Cystic fibrosis with pulmonary exacerbation (CMS-HCC)  Active Problems:    Essential hypertension    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)    Pancreatic insufficiency due to cystic fibrosis (CMS-HCC)    Chronic deep vein thrombosis (DVT) of lower extremity (CMS-HCC)  Resolved Problems:    * No resolved hospital problems. *    Gurtaj Ruz is a 35 y.o. male with PMHx of CF who presented to [facility] with Cystic fibrosis with pulmonary exacerbation (CMS-HCC).  ??  *Acute exacerbation of CF bronchiectasis: Pt recommended to purdue admission by his primary CF doc.  He has grown MSSA and 2 variants of PSAS (one pan resistant and one with intermediate resistance)  - CF sputum/culture  - CXR PA and lateral with sequela of RUL lobectomy and chronic scarring in R base  - Zosyn 4.5 mg q6h - Note: patient has had cefepime allergy and developed AIN from Zosyn.  Will need to monitor closely  - Tobramycin IV, pharmacy to dose - Please check Mg with BMP while on Tobramycin  - MRSA screen/ cx pending.  - Plan for at least 14 days of antibiotics.  Will likely need to be completed as an inpt.  - Continue Trikafta (non-formulary), Breo (formulary alternative for Symbicort) and Pulmozyme  - Airway clearance with albuterol TID, 7% hypertonic saline, chest vest, and aerobika  - Monitor daily CBC and CMP  - Patient will require PFTs while in hospital.  Ordered for 4/19  - INR normal, please order vit K 5mg  po 2x/week while on abx in addition to his multivitamin      We appreciate the opportunity to assist in the care of this patient.  Please page [phone] with any questions.    Burman Riis Lakenzie Mcclafferty, MD    Subjective:      History of Present Illness:  Mr. Lafoe is a 35 y.o. male admitted for CF exacerbation.  Has had recent pleuritic chest pain and fever.  Recommended by primary CF doc to present for admission and IV Abx.     Review of Systems: A comprehensive review of systems was performed and was negative except as above in HPI.  Past Medical History:   Diagnosis Date   ??? Anxiety    ??? Chronic pain disorder    ??? Cystic fibrosis (CMS-HCC)    ??? Depression    ??? Hypertension    ??? Nonproductive cough 04/05/2018     Past Surgical History:   Procedure Laterality Date   ??? IR INSERT PORT AGE GREATER THAN 5 YRS  03/27/2019    IR INSERT PORT AGE GREATER THAN 5 YRS 03/27/2019 Rush Barer, MD IMG VIR [facility]   ??? PR REMOVAL OF LUNG,LOBECTOMY Right 03/29/2017    Procedure: REMOVAL OF LUNG, OTHER THAN PNEUMONECTOMY; SINGLE LOBE (LOBECTOMY);  Surgeon: Cherie Dark, MD;  Location: MAIN OR [facility];  Service: Thoracic     Medications reviewed in Epic  Allergies as of 11/24/2020 - Reviewed 11/24/2020   Allergen Reaction Noted   ??? Aztreonam Anaphylaxis, Hives, Nausea And Vomiting, and Rash 09/06/2015   ??? [place] [aztreonam lysine] Anaphylaxis 12/27/2016   ??? Cefepime Itching, Nausea Only, and Other (See Comments) 09/06/2015   ??? Other Anaphylaxis and Other (See Comments) 09/06/2015   ??? Slo-bid 100 Anaphylaxis 06/20/2017   ??? Tobramycin Tinnitus 09/06/2015   ??? Banana Itching and Nausea And Vomiting 07/29/2016     Family History   Problem Relation Age of Onset   ??? Bipolar disorder Mother    ??? Depression Mother      Social History     Tobacco Use   ??? Smoking status: Former Smoker     Types: e-Cigarettes   ??? Smokeless tobacco: Never Used   Substance Use Topics   ??? Alcohol use: Not Currently     Alcohol/week: 3.0 standard drinks     Types: 3 Glasses of wine per week        Objective:      Physical Exam:  Vitals:    11/26/20 0935 11/26/20 1353 11/26/20 2000 11/26/20 2047   BP: 107/68 129/84 116/73    Pulse:  86 91 90   Resp:  18 12 18    Temp:  36.9 ??C 35.7 ??C    TempSrc:  Oral Oral    SpO2:   96% 97%   Weight:       Height:           General: chronically ill appearing male in NAD  HEENT: anicteric sclera, moist mucous membranes  Neck: supple, no palpable LAD  CV: RRR, no murmurs, rubs, or gallops  Pulm: normal work of breathing, Good air movement.  CTAB  Abd: soft, non-tender, non-distended  Ext: warm and well perfused, no edema  Neuro: grossly intact  Psych: appropriate mood and affect    Malnutrition Assessment by RD:          Diagnostic Review:   All labs and images were personally reviewed.

## 2020-11-27 NOTE — Unmapped (Signed)
Pt assisted with needs.Pt chest pain being controlled appropriately without any acute distress.Fall  precaution maintained.Encouraged to call for help when in need.Will keep monitoring.   Problem: Adult Inpatient Plan of Care  Goal: Plan of Care Review  Outcome: Progressing  Goal: Patient-Specific Goal (Individualized)  Outcome: Progressing  Goal: Absence of Hospital-Acquired Illness or Injury  Outcome: Progressing  Intervention: Identify and Manage Fall Risk  Recent Flowsheet Documentation  Taken 11/26/2020 2000 by Wonda Olds, RN  Safety Interventions:  ??? fall reduction program maintained  ??? lighting adjusted for tasks/safety  ??? low bed  ??? isolation precautions  Intervention: Prevent and Manage VTE (Venous Thromboembolism) Risk  Recent Flowsheet Documentation  Taken 11/26/2020 2000 by Wonda Olds, RN  Activity Management: activity adjusted per tolerance  Intervention: Prevent Infection  Recent Flowsheet Documentation  Taken 11/26/2020 2000 by Wonda Olds, RN  Infection Prevention:  ??? hand hygiene promoted  ??? personal protective equipment utilized  Goal: Optimal Comfort and Wellbeing  Outcome: Progressing  Goal: Readiness for Transition of Care  Outcome: Progressing  Goal: Rounds/Family Conference  Outcome: Progressing     Problem: Infection  Goal: Absence of Infection Signs and Symptoms  Outcome: Progressing  Intervention: Prevent or Manage Infection  Recent Flowsheet Documentation  Taken 11/26/2020 2000 by Wonda Olds, RN  Infection Management: aseptic technique maintained  Isolation Precautions: contact precautions maintained     Problem: Infection (Cystic Fibrosis)  Goal: Absence of Infection Signs and Symptoms  Outcome: Progressing  Intervention: Manage Infection and Prevent Transmission  Recent Flowsheet Documentation  Taken 11/26/2020 2000 by Wonda Olds, RN  Infection Management: aseptic technique maintained  Isolation Precautions: contact precautions maintained     Problem: Respiratory Compromise (Cystic Fibrosis)  Goal: Effective Oxygenation and Ventilation  Outcome: Progressing     Problem: Airway Clearance Ineffective  Goal: Effective Airway Clearance  Outcome: Progressing  Intervention: Promote Airway Secretion Clearance  Recent Flowsheet Documentation  Taken 11/26/2020 2000 by Wonda Olds, RN  Activity Management: activity adjusted per tolerance

## 2020-11-27 NOTE — Unmapped (Signed)
Pt alert and oriented x4. Pt in NAD, VSS. Pt independent of care. IVABX given per order. Labs drawn this shift. Pt reported chest pain and requested 1x dose of morphine. MD paged and med was ordered. Med given per order. Pt tolerated well. CXR done for concern of pneumo. Pt resting in bed. Call light within reach.      Problem: Adult Inpatient Plan of Care  Goal: Plan of Care Review  Outcome: Progressing  Goal: Patient-Specific Goal (Individualized)  Outcome: Progressing  Goal: Absence of Hospital-Acquired Illness or Injury  Outcome: Progressing  Goal: Optimal Comfort and Wellbeing  Outcome: Progressing  Goal: Readiness for Transition of Care  Outcome: Progressing  Goal: Rounds/Family Conference  Outcome: Progressing     Problem: Infection  Goal: Absence of Infection Signs and Symptoms  Outcome: Progressing     Problem: Adjustment to Illness (Cystic Fibrosis)  Goal: Optimal Coping  Outcome: Progressing     Problem: Infection (Cystic Fibrosis)  Goal: Absence of Infection Signs and Symptoms  Outcome: Progressing     Problem: Oral Intake Inadequate (Cystic Fibrosis)  Goal: Optimal Nutrition Intake  Outcome: Progressing     Problem: Malabsorption (Cystic Fibrosis)  Goal: Optimal Bowel Elimination  Outcome: Progressing     Problem: Respiratory Compromise (Cystic Fibrosis)  Goal: Effective Oxygenation and Ventilation  Outcome: Progressing     Problem: Airway Clearance Ineffective  Goal: Effective Airway Clearance  Outcome: Progressing

## 2020-11-27 NOTE — Unmapped (Signed)
Patient alert and oriented x 4 and in NAD.  Medications given as ordered.  Tobramycin given late d/t not available from pharmacy.  RT at bedside throughout shift for breathing treatments.  Family at bedside during the afternoon.      Problem: Adult Inpatient Plan of Care  Goal: Plan of Care Review  Outcome: Progressing  Goal: Patient-Specific Goal (Individualized)  Outcome: Progressing  Goal: Absence of Hospital-Acquired Illness or Injury  Outcome: Progressing  Intervention: Identify and Manage Fall Risk  Recent Flowsheet Documentation  Taken 11/27/2020 0800 by Hansel Starling, RN  Safety Interventions:   environmental modification   fall reduction program maintained   infection management   isolation precautions   lighting adjusted for tasks/safety   nonskid shoes/slippers when out of bed  Intervention: Prevent Skin Injury  Recent Flowsheet Documentation  Taken 11/27/2020 0800 by Hansel Starling, RN  Skin Protection:   adhesive use limited   cleansing with dimethicone incontinence wipes   incontinence pads utilized  Intervention: Prevent and Manage VTE (Venous Thromboembolism) Risk  Recent Flowsheet Documentation  Taken 11/27/2020 0800 by Hansel Starling, RN  Activity Management: activity adjusted per tolerance  Intervention: Prevent Infection  Recent Flowsheet Documentation  Taken 11/27/2020 0800 by Hansel Starling, RN  Infection Prevention:   cohorting utilized   environmental surveillance performed   equipment surfaces disinfected   hand hygiene promoted   personal protective equipment utilized   rest/sleep promoted   single patient room provided   visitors restricted/screened  Goal: Optimal Comfort and Wellbeing  Outcome: Progressing  Goal: Readiness for Transition of Care  Outcome: Progressing  Goal: Rounds/Family Conference  Outcome: Progressing     Problem: Infection  Goal: Absence of Infection Signs and Symptoms  Outcome: Progressing  Intervention: Prevent or Manage Infection  Recent Flowsheet Documentation  Taken 11/27/2020 0800 by Hansel Starling, RN  Infection Management: aseptic technique maintained  Isolation Precautions: contact precautions maintained     Problem: Adjustment to Illness (Cystic Fibrosis)  Goal: Optimal Coping  Outcome: Progressing     Problem: Infection (Cystic Fibrosis)  Goal: Absence of Infection Signs and Symptoms  Outcome: Progressing  Intervention: Manage Infection and Prevent Transmission  Recent Flowsheet Documentation  Taken 11/27/2020 0800 by Hansel Starling, RN  Infection Management: aseptic technique maintained  Isolation Precautions: contact precautions maintained     Problem: Oral Intake Inadequate (Cystic Fibrosis)  Goal: Optimal Nutrition Intake  Outcome: Progressing     Problem: Malabsorption (Cystic Fibrosis)  Goal: Optimal Bowel Elimination  Outcome: Progressing     Problem: Respiratory Compromise (Cystic Fibrosis)  Goal: Effective Oxygenation and Ventilation  Outcome: Progressing     Problem: Airway Clearance Ineffective  Goal: Effective Airway Clearance  Outcome: Progressing  Intervention: Promote Airway Secretion Clearance  Recent Flowsheet Documentation  Taken 11/27/2020 0800 by Hansel Starling, RN  Activity Management: activity adjusted per tolerance

## 2020-11-27 NOTE — Unmapped (Signed)
Hospitalist Daily Progress Note     LOS: 3 days       Assessment/Plan:  Principal Problem:    Cystic fibrosis with pulmonary exacerbation (CMS-HCC)  Active Problems:    Essential hypertension    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)    Pancreatic insufficiency due to cystic fibrosis (CMS-HCC)    Chronic deep vein thrombosis (DVT) of lower extremity (CMS-HCC)  Resolved Problems:    * No resolved hospital problems. *         Christian Young is a 35 y.o. male with PMHx of CF who presented to [facility] with Cystic fibrosis with pulmonary exacerbation (CMS-HCC).  ??  *Acute exacerbation of CF bronchiectasis: presents with progressively worsening dry cough as well as chest pain, subjective fevers, and night sweats.  CXR with no consolidation.  He had chest pain yesterday, pneumothorax was ruled out, initial false positive on chest x-ray was reviewed.  The patient reports that with his CF flares, he typically has chest pain, he describes a sharp pain made worse with breathing or coughing consistent with pleurisy.     - Pulmonology consult in place, appreciated.  INR reviewed, vitamin K ordered with follow-up INR.  - MRSA screen is negative.  Specimen for CF sputum culture has yet to be collected.  - CXR PA and lateral on 4/16 consistent with known prior right upper lobectomy.  No pneumothorax.  Continue oxycodone for pleuritic chest pain.  NSAIDs relatively contraindicated as he is chronically anticoagulated.  - Zosyn 4.5 mg q6h - Note: patient has had cefepime allergy and developed AIN from Zosyn.  Will need to monitor closely, CMP and CBC daily.  - Tobramycin IV, pharmacy to dose  - Plan for at least 14 days of antibiotics, no allergy, no diarrhea reported at this time.  - Continue Trikafta (non-formulary), Breo (formulary alternative for Symbicort) and Pulmozyme  - Airway clearance with albuterol TID, 7% hypertonic saline, chest vest, and [place]  - Patient will require PFTs while in hospital (not yet ordered), timing to be determined based upon clinical condition and duration of antibiotic therapy.  ??    *Diabetes mellitus related to cystic fibrosis:  - SSI + Accuchecks ACHS   - Lantus 50 units nightly, .  The patient is on a high-calorie high-protein diet.  He does drink some sugary sweet drinks, requires long-acting, Premeal and sliding scale insulin.  ???Encourage minimizing and/or spreading out throughout the day frankly sweet, sugary simple carbohydrate containing foods and beverages      *Pancreatic insufficiency due to CF:  - Continue Creon 8 capsules TID with meals and 4 capsules prn snacks  - Vitamins ADEK  ??  *History of DVT: continue Xarelto 20 mg daily.  No abnormal bleeding reported.  ??  CARE CHECK LIST:  - FEN/GI: regular diet, IVFs prn  - PPx: home Xarelto  - Code: Full  ___________________________________________________________________  Scheduled Meds:  ??? albuterol  2.5 mg Nebulization TID   ??? amitriptyline  100 mg Oral Nightly   ??? atorvastatin  20 mg Oral Nightly   ??? cetirizine  10 mg Oral Daily   ??? dornase alfa  2.5 mg Inhalation BID   ??? elexacaftor-tezacaftor-ivacaft  2 tablet Oral Daily    And   ??? elexacaftor-tezacaftor-ivacaft  1 tablet Oral At bedtime   ??? famotidine  20 mg Oral Daily   ??? FLUoxetine  60 mg Oral Daily   ??? fluticasone furoate-vilanteroL  1 puff Inhalation Daily (RT)   ???  fluticasone propionate  1 spray Each Nare Daily   ??? gabapentin  600 mg Oral Daily   ??? insulin glargine  50 Units Subcutaneous Nightly   ??? insulin lispro  0-12 Units Subcutaneous ACHS   ??? lamoTRIgine  200 mg Oral Daily   ??? lipase-protease-amylase  8 capsule Oral 3xd Meals   ??? lisinopriL  10 mg Oral Daily   ??? melatonin  10.5 mg Oral Nightly   ??? pantoprazole  20 mg Oral Daily   ??? MVW Complete (pediatric multivit 61-D3-vit K)  1 capsule Oral BID   ??? phytonadione (vitamin K1)  5 mg Oral Daily   ??? piperacillin-tazobactam (ZOSYN) IV (intermittent)  4.5 g Intravenous Q6H   ??? pramipexole  0.5 mg Oral Q PM   ??? rivaroxaban 20 mg Oral Daily   ??? sodium chloride 7%  4 mL Nebulization TID   ??? tobramycin (NEBCIN) IVPB (conventional dosing)  260 mg Intravenous Q12H   ??? traZODone  100 mg Oral Nightly     Continuous Infusions:  PRN Meds:.acetaminophen, albuterol, dextrose in water, lipase-protease-amylase, ondansetron, oxyCODONE, senna    *DVT prophylaxis: He is fully ambulatory.    *Disposition: At this time, the plan is to remain in house for at least 14 days of intravenous antibiotics      Please page the [facility]) pager at [phone] with questions.      Consultants:   1.  Pulmonology      Subjective:   Chest pain is better.  It still recurs with cough or deep breath.  It is much better than yesterday.    Objective:       Vital signs in last 24 hours:  Temp:  [35.7 ??C (96.3 ??F)-36.9 ??C (98.4 ??F)] 35.7 ??C (96.3 ??F)  Heart Rate:  [86-91] 90  Resp:  [12-18] 18  BP: (116-129)/(73-84) 116/73  MAP (mmHg):  [89-97] 89  SpO2:  [96 %-97 %] 97 %      Physical Exam: Vitals as above.  General:??chronically ill??appearing??male??in NAD, looks mildly weak, not in any respiratory difficulty, no increased work of breathing.  HEENT: anicteric sclera, moist mucous membranes  CV: RRR, no murmurs, rubs, or gallops  Pulm: normal work of breathing, Good air movement.   Few crackles on right.  Abd: soft, non-tender, non-distended  Ext: No cyanosis, clubbing or edema.  Neuro: Speech fluent.  No acute focal motor deficits.  Psych: Friendly, interactive, answers all questions appropriately.    Medications:   Scheduled Meds:  ??? albuterol  2.5 mg Nebulization TID   ??? amitriptyline  100 mg Oral Nightly   ??? atorvastatin  20 mg Oral Nightly   ??? cetirizine  10 mg Oral Daily   ??? dornase alfa  2.5 mg Inhalation BID   ??? elexacaftor-tezacaftor-ivacaft  2 tablet Oral Daily    And   ??? elexacaftor-tezacaftor-ivacaft  1 tablet Oral At bedtime   ??? famotidine  20 mg Oral Daily   ??? FLUoxetine  60 mg Oral Daily   ??? fluticasone furoate-vilanteroL  1 puff Inhalation Daily (RT)   ??? fluticasone propionate  1 spray Each Nare Daily   ??? gabapentin  600 mg Oral Daily   ??? insulin glargine  50 Units Subcutaneous Nightly   ??? insulin lispro  0-12 Units Subcutaneous ACHS   ??? lamoTRIgine  200 mg Oral Daily   ??? lipase-protease-amylase  8 capsule Oral 3xd Meals   ??? lisinopriL  10 mg Oral Daily   ??? melatonin  10.5  mg Oral Nightly   ??? pantoprazole  20 mg Oral Daily   ??? MVW Complete (pediatric multivit 61-D3-vit K)  1 capsule Oral BID   ??? phytonadione (vitamin K1)  5 mg Oral Daily   ??? piperacillin-tazobactam (ZOSYN) IV (intermittent)  4.5 g Intravenous Q6H   ??? pramipexole  0.5 mg Oral Q PM   ??? rivaroxaban  20 mg Oral Daily   ??? sodium chloride 7%  4 mL Nebulization TID   ??? tobramycin (NEBCIN) IVPB (conventional dosing)  260 mg Intravenous Q12H   ??? traZODone  100 mg Oral Nightly     Continuous Infusions:    Lab Results   Component Value Date    WBC 4.6 11/27/2020    HGB 12.6 (L) 11/27/2020    HCT 36.6 (L) 11/27/2020    PLT 258 11/27/2020       Lab Results   Component Value Date    NA 136 11/27/2020    K 3.7 11/27/2020    CL 100 11/27/2020    CO2 31.2 (H) 11/27/2020    BUN 10 11/27/2020    CREATININE 0.65 11/27/2020    GLU 198 (H) 11/27/2020    CALCIUM 8.7 11/27/2020    MG 1.6 11/27/2020    PHOS 3.9 08/12/2018       Lab Results   Component Value Date    BILITOT 0.4 11/27/2020    BILIDIR 0.20 07/04/2020    PROT 6.5 11/27/2020    ALBUMIN 3.6 11/27/2020    ALT 36 11/27/2020    AST 21 11/27/2020    ALKPHOS 70 11/27/2020       Lab Results   Component Value Date    PT 16.9 (H) 11/26/2020    INR 1.45 11/26/2020     Recent Labs     11/24/20  1253 11/25/20  0626 11/27/20  0508   NA 133*   < > 136   K 4.2   < > 3.7   CL 98   < > 100   CO2 24.6   < > 31.2*   BUN 16   < > 10   CREATININE 0.61   < > 0.65   GLU 362*   < > 198*   CALCIUM 9.5   < > 8.7   PROT 7.4   < > 6.5   BILITOT 0.4   < > 0.4   AST 26   < > 21   ALT 39   < > 36   ALKPHOS 77   < > 70   MG  --    < > 1.6   ESR 10  --   --    CRP <4.0  --   -- < > = values in this interval not displayed.     Recent Labs     11/24/20  1253 11/26/20  0838   TROPONINI <3  --    INR  --  1.45     Pending Labs     Order Current Status    Blood Culture #1 Preliminary result    Blood Culture #2 Preliminary result      Blood cultures negative to date.    35 minutes, over 50% spent in counseling and coordination of care.  Tawni Levy MD

## 2020-11-28 LAB — CBC W/ AUTO DIFF
BASOPHILS ABSOLUTE COUNT: 0.1 10*9/L (ref 0.0–0.1)
BASOPHILS RELATIVE PERCENT: 1.3 %
EOSINOPHILS ABSOLUTE COUNT: 0.3 10*9/L (ref 0.0–0.5)
EOSINOPHILS RELATIVE PERCENT: 5.1 %
HEMATOCRIT: 36.4 % — ABNORMAL LOW (ref 39.0–48.0)
HEMOGLOBIN: 12.3 g/dL — ABNORMAL LOW (ref 12.9–16.5)
LYMPHOCYTES ABSOLUTE COUNT: 1.3 10*9/L (ref 1.1–3.6)
LYMPHOCYTES RELATIVE PERCENT: 25.3 %
MEAN CORPUSCULAR HEMOGLOBIN CONC: 33.8 g/dL (ref 32.0–36.0)
MEAN CORPUSCULAR HEMOGLOBIN: 28.9 pg (ref 25.9–32.4)
MEAN CORPUSCULAR VOLUME: 85.6 fL (ref 77.6–95.7)
MEAN PLATELET VOLUME: 7.8 fL (ref 6.8–10.7)
MONOCYTES ABSOLUTE COUNT: 0.5 10*9/L (ref 0.3–0.8)
MONOCYTES RELATIVE PERCENT: 10.3 %
NEUTROPHILS ABSOLUTE COUNT: 3 10*9/L (ref 1.8–7.8)
NEUTROPHILS RELATIVE PERCENT: 58 %
NUCLEATED RED BLOOD CELLS: 0 /100{WBCs} (ref <=4)
PLATELET COUNT: 239 10*9/L (ref 150–450)
RED BLOOD CELL COUNT: 4.25 10*12/L — ABNORMAL LOW (ref 4.26–5.60)
RED CELL DISTRIBUTION WIDTH: 14.4 % (ref 12.2–15.2)
WBC ADJUSTED: 5.2 10*9/L (ref 3.6–11.2)

## 2020-11-28 LAB — COMPREHENSIVE METABOLIC PANEL
ALBUMIN: 3.4 g/dL (ref 3.4–5.0)
ALKALINE PHOSPHATASE: 65 U/L (ref 46–116)
ALT (SGPT): 38 U/L (ref 10–49)
ANION GAP: 4 mmol/L — ABNORMAL LOW (ref 5–14)
AST (SGOT): 21 U/L (ref <=34)
BILIRUBIN TOTAL: 0.4 mg/dL (ref 0.3–1.2)
BLOOD UREA NITROGEN: 12 mg/dL (ref 9–23)
BUN / CREAT RATIO: 19
CALCIUM: 8.8 mg/dL (ref 8.7–10.4)
CHLORIDE: 101 mmol/L (ref 98–107)
CO2: 31.9 mmol/L — ABNORMAL HIGH (ref 20.0–31.0)
CREATININE: 0.63 mg/dL
EGFR CKD-EPI AA MALE: >90 mL/min/{1.73_m2} (ref >=60)
EGFR CKD-EPI NON-AA MALE: >90 mL/min/{1.73_m2} (ref >=60)
GLUCOSE RANDOM: 193 mg/dL — ABNORMAL HIGH (ref 70–179)
POTASSIUM: 4 mmol/L (ref 3.4–4.8)
PROTEIN TOTAL: 6.4 g/dL (ref 5.7–8.2)
SODIUM: 137 mmol/L (ref 135–145)

## 2020-11-28 MED ADMIN — ondansetron (ZOFRAN) injection 4 mg: 4 mg | INTRAVENOUS | @ 20:00:00

## 2020-11-28 MED ADMIN — insulin glargine (LANTUS) injection 50 Units: 50 [IU] | SUBCUTANEOUS | @ 01:00:00

## 2020-11-28 MED ADMIN — lisinopriL (PRINIVIL,ZESTRIL) tablet 10 mg: 10 mg | ORAL | @ 13:00:00

## 2020-11-28 MED ADMIN — atorvastatin (LIPITOR) tablet 20 mg: 20 mg | ORAL

## 2020-11-28 MED ADMIN — tobramycin (NEBCIN) 260 mg in sodium chloride (NS) 0.9 % 100 mL IVPB: 260 mg | INTRAVENOUS | @ 16:00:00 | Stop: 2020-11-28

## 2020-11-28 MED ADMIN — tobramycin (NEBCIN) 260 mg in sodium chloride (NS) 0.9 % 100 mL IVPB: 260 mg | INTRAVENOUS | @ 05:00:00 | Stop: 2020-11-28

## 2020-11-28 MED ADMIN — sodium chloride 7% nebulizer solution 4 mL: 4 mL | RESPIRATORY_TRACT | @ 18:00:00

## 2020-11-28 MED ADMIN — melatonin tablet 10.5 mg: 10 mg | ORAL

## 2020-11-28 MED ADMIN — pramipexole (MIRAPEX) tablet 0.5 mg: .5 mg | ORAL | @ 22:00:00

## 2020-11-28 MED ADMIN — ondansetron (ZOFRAN-ODT) disintegrating tablet 4 mg: 4 mg | ORAL | @ 13:00:00 | Stop: 2020-11-28

## 2020-11-28 MED ADMIN — piperacillin-tazobactam (ZOSYN) IVPB (premix) 4.5 g: 4.5 g | INTRAVENOUS | @ 05:00:00 | Stop: 2020-12-09

## 2020-11-28 MED ADMIN — traZODone (DESYREL) tablet 100 mg: 100 mg | ORAL

## 2020-11-28 MED ADMIN — phytonadione (vitamin K1) (MEPHYTON) tablet 5 mg: 5 mg | ORAL | @ 13:00:00 | Stop: 2020-12-01

## 2020-11-28 MED ADMIN — insulin lispro (HumaLOG) injection 0-12 Units: 0-12 [IU] | SUBCUTANEOUS

## 2020-11-28 MED ADMIN — gabapentin (NEURONTIN) capsule 600 mg: 600 mg | ORAL | @ 13:00:00

## 2020-11-28 MED ADMIN — acetaminophen (TYLENOL) tablet 650 mg: 650 mg | ORAL | @ 13:00:00

## 2020-11-28 MED ADMIN — lipase-protease-amylase (CREON) 36,000-114,000- 180,000 unit capsule 288,000 units of lipase **PATIENT-SUPPLIED**: 8 | ORAL | @ 13:00:00

## 2020-11-28 MED ADMIN — famotidine (PEPCID) tablet 20 mg: 20 mg | ORAL | @ 13:00:00

## 2020-11-28 MED ADMIN — piperacillin-tazobactam (ZOSYN) IVPB (premix) 4.5 g: 4.5 g | INTRAVENOUS | @ 22:00:00 | Stop: 2020-12-09

## 2020-11-28 MED ADMIN — dornase alfa (PULMOZYME) 1 mg/mL solution 2.5 mg: 2.5 mg | RESPIRATORY_TRACT | @ 01:00:00

## 2020-11-28 MED ADMIN — piperacillin-tazobactam (ZOSYN) IVPB (premix) 4.5 g: 4.5 g | INTRAVENOUS | @ 17:00:00 | Stop: 2020-12-09

## 2020-11-28 MED ADMIN — insulin lispro (HumaLOG) injection 0-12 Units: 0-12 [IU] | SUBCUTANEOUS | @ 22:00:00

## 2020-11-28 MED ADMIN — cetirizine (ZyrTEC) tablet 10 mg: 10 mg | ORAL | @ 13:00:00

## 2020-11-28 MED ADMIN — pediatric multivitamin-vit D3 1,500 unit-vit K 800 mcg (MVW COMPLETE FORMULATION) capsule: 1 | ORAL

## 2020-11-28 MED ADMIN — rivaroxaban (XARELTO) tablet 20 mg: 20 mg | ORAL | @ 22:00:00

## 2020-11-28 MED ADMIN — lamoTRIgine (LaMICtal) tablet 200 mg: 200 mg | ORAL | @ 13:00:00

## 2020-11-28 MED ADMIN — albuterol nebulizer solution 2.5 mg: 2.5 mg | RESPIRATORY_TRACT | @ 13:00:00

## 2020-11-28 MED ADMIN — oxyCODONE (ROXICODONE) immediate release tablet 5 mg: 5 mg | ORAL | @ 01:00:00 | Stop: 2020-12-08

## 2020-11-28 MED ADMIN — fluticasone furoate-vilanteroL (BREO ELLIPTA) 200-25 mcg/dose inhaler 1 puff: 1 | RESPIRATORY_TRACT | @ 13:00:00

## 2020-11-28 MED ADMIN — sodium chloride 7% nebulizer solution 4 mL: 4 mL | RESPIRATORY_TRACT | @ 13:00:00

## 2020-11-28 MED ADMIN — pediatric multivitamin-vit D3 1,500 unit-vit K 800 mcg (MVW COMPLETE FORMULATION) capsule: 1 | ORAL | @ 13:00:00

## 2020-11-28 MED ADMIN — lipase-protease-amylase (CREON) 36,000-114,000- 180,000 unit capsule 288,000 units of lipase **PATIENT-SUPPLIED**: 8 | ORAL | @ 22:00:00

## 2020-11-28 MED ADMIN — albuterol nebulizer solution 2.5 mg: 2.5 mg | RESPIRATORY_TRACT | @ 01:00:00

## 2020-11-28 MED ADMIN — elexacaftor-tezacaftor-ivacaft (TRIKAFTA) tablet 2 tablet **Patient Supplied**: 2 | ORAL | @ 13:00:00

## 2020-11-28 MED ADMIN — pantoprazole (PROTONIX) EC tablet 20 mg: 20 mg | ORAL | @ 13:00:00

## 2020-11-28 MED ADMIN — lipase-protease-amylase (CREON) 36,000-114,000- 180,000 unit capsule 288,000 units of lipase **PATIENT-SUPPLIED**: 8 | ORAL | @ 16:00:00

## 2020-11-28 MED ADMIN — elexacaftor-tezacaftor-ivacaft (TRIKAFTA) tablet 1 tablet: 1 | ORAL

## 2020-11-28 MED ADMIN — piperacillin-tazobactam (ZOSYN) IVPB (premix) 4.5 g: 4.5 g | INTRAVENOUS | @ 10:00:00 | Stop: 2020-12-09

## 2020-11-28 MED ADMIN — insulin lispro (HumaLOG) injection 0-12 Units: 0-12 [IU] | SUBCUTANEOUS | @ 16:00:00

## 2020-11-28 MED ADMIN — FLUoxetine (PROzac) capsule 60 mg: 60 mg | ORAL | @ 13:00:00

## 2020-11-28 MED ADMIN — fluticasone propionate (FLONASE) 50 mcg/actuation nasal spray 1 spray: 1 | NASAL | @ 13:00:00

## 2020-11-28 MED ADMIN — acetaminophen (TYLENOL) tablet 650 mg: 650 mg | ORAL | @ 01:00:00

## 2020-11-28 MED ADMIN — dornase alfa (PULMOZYME) 1 mg/mL solution 2.5 mg: 2.5 mg | RESPIRATORY_TRACT | @ 13:00:00

## 2020-11-28 MED ADMIN — amitriptyline (ELAVIL) tablet 100 mg: 100 mg | ORAL

## 2020-11-28 MED ADMIN — sodium chloride 7% nebulizer solution 4 mL: 4 mL | RESPIRATORY_TRACT | @ 01:00:00

## 2020-11-28 MED ADMIN — oxyCODONE (ROXICODONE) immediate release tablet 5 mg: 5 mg | ORAL | @ 13:00:00 | Stop: 2020-12-08

## 2020-11-28 MED ADMIN — albuterol nebulizer solution 2.5 mg: 2.5 mg | RESPIRATORY_TRACT | @ 18:00:00

## 2020-11-28 NOTE — Unmapped (Signed)
Hospitalist Daily Progress Note     LOS: 4 days       Assessment/Plan:  Principal Problem:    Cystic fibrosis with pulmonary exacerbation (CMS-HCC)  Active Problems:    Essential hypertension    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)    Pancreatic insufficiency due to cystic fibrosis (CMS-HCC)    Chronic deep vein thrombosis (DVT) of lower extremity (CMS-HCC)  Resolved Problems:    * No resolved hospital problems. *         Christian Young is a 35 y.o. male with PMHx of CF who presented to [facility] with Cystic fibrosis with pulmonary exacerbation (CMS-HCC).  MRSA screen negative    *Acute exacerbation of CF bronchiectasis: He is having a bit more nausea than his typical for a flare of his bronchiectasis.  No complaint of pleuritic type chest pain today, current pain management regimen working.  CF culture shows both smooth and mucoid Pseudomonas aeruginosa.  - Pulmonology consult in place, appreciated.   - Zosyn 4.5 mg q6h - Note: patient has had cefepime allergy and developed AIN from Zosyn.  Will need to monitor closely, CMP and CBC daily.  - Tobramycin IV, pharmacy to dose, 14 days ordered at this time, no diarrhea, no report of allergic reaction  - Continue Trikafta (non-formulary), Breo (formulary alternative for Symbicort) and Pulmozyme  - Airway clearance with albuterol TID, 7% hypertonic saline, chest vest, and [place]  - Patient will require PFTs while in hospital (not yet ordered), timing to be determined based upon clinical condition and duration of antibiotic therapy.  ??-Follow-up INR after supplemental vitamin K given for elevated INR 1.45.  -AFB smear negative, blood cultures negative at 4 days.    *Diabetes mellitus related to cystic fibrosis:  - SSI + Accuchecks ACHS   - Lantus 50 units nightly, .  The patient is on a high-calorie high-protein diet.  We discussed trying to avoid sugary drinks or [employer] type high-calorie sugary beverages.  ???Encourage minimizing and/or spreading out throughout the day frankly sweet, sugary simple carbohydrate containing foods and beverages      *Pancreatic insufficiency due to CF:  - Continue Creon 8 capsules TID with meals and 4 capsules prn snacks  - Vitamins ADEK  ??  *History of DVT: continue Xarelto 20 mg daily.  No abnormal bleeding reported.  ??  CARE CHECK LIST:  - FEN/GI: regular diet, IVFs prn  - PPx: home Xarelto  - Code: Full  ___________________________________________________________________  Scheduled Meds:  ??? albuterol  2.5 mg Nebulization TID   ??? amitriptyline  100 mg Oral Nightly   ??? atorvastatin  20 mg Oral Nightly   ??? cetirizine  10 mg Oral Daily   ??? dornase alfa  2.5 mg Inhalation BID   ??? elexacaftor-tezacaftor-ivacaft  2 tablet Oral Daily    And   ??? elexacaftor-tezacaftor-ivacaft  1 tablet Oral At bedtime   ??? famotidine  20 mg Oral Daily   ??? FLUoxetine  60 mg Oral Daily   ??? fluticasone furoate-vilanteroL  1 puff Inhalation Daily (RT)   ??? fluticasone propionate  1 spray Each Nare Daily   ??? gabapentin  600 mg Oral Daily   ??? insulin glargine  50 Units Subcutaneous Nightly   ??? insulin lispro  0-12 Units Subcutaneous ACHS   ??? lamoTRIgine  200 mg Oral Daily   ??? lipase-protease-amylase  8 capsule Oral 3xd Meals   ??? lisinopriL  10 mg Oral Daily   ??? melatonin  10.5  mg Oral Nightly   ??? pantoprazole  20 mg Oral Daily   ??? MVW Complete (pediatric multivit 61-D3-vit K)  1 capsule Oral BID   ??? phytonadione (vitamin K1)  5 mg Oral Daily   ??? piperacillin-tazobactam (ZOSYN) IV (intermittent)  4.5 g Intravenous Q6H   ??? polyethylen glycol  17 g Oral daily   ??? pramipexole  0.5 mg Oral Q PM   ??? rivaroxaban  20 mg Oral Daily   ??? sodium chloride 7%  4 mL Nebulization TID   ??? tobramycin (NEBCIN) IVPB (conventional dosing)  260 mg Intravenous Q12H   ??? traZODone  100 mg Oral Nightly     Continuous Infusions:  PRN Meds:.acetaminophen, albuterol, dextrose in water, lipase-protease-amylase, ondansetron, oxyCODONE, senna    *DVT prophylaxis: He is fully ambulatory.    *Disposition: At this time, the plan is to remain in house for at least 14 days of intravenous antibiotics      Please page the [facility]) pager at [phone] with questions.    Consultants:   1.  Pulmonology    Subjective:   No complaint of chest pain, fever or chills.  Reports some nausea at the nursing.  No complaint of vomiting.    Objective:     Vital signs in last 24 hours:  Temp:  [35.6 ??C (96 ??F)-36.1 ??C (97 ??F)] 36.1 ??C (97 ??F)  Heart Rate:  [82-95] 95  Resp:  [14-18] 18  BP: (112-123)/(59-86) 123/86  MAP (mmHg):  [79-99] 99  SpO2:  [99 %-100 %] 100 %      Physical Exam: Vitals as above.  General: Alert, conversant, mildly ill-appearing but in no acute distress.  HEENT: anicteric sclera, moist mucous membranes  CV: RRR, no murmurs, rubs, or gallops  Pulm: normal work of breathing, Good air movement.   No adventitial sounds appreciated today.  Abd: soft, non-tender, non-distended  Ext: No cyanosis, clubbing or edema.  Neuro: Speech fluent.  No acute focal motor deficits.  Psych: Friendly, interactive, answers all questions appropriately.    Medications:   Scheduled Meds:  ??? albuterol  2.5 mg Nebulization TID   ??? amitriptyline  100 mg Oral Nightly   ??? atorvastatin  20 mg Oral Nightly   ??? cetirizine  10 mg Oral Daily   ??? dornase alfa  2.5 mg Inhalation BID   ??? elexacaftor-tezacaftor-ivacaft  2 tablet Oral Daily    And   ??? elexacaftor-tezacaftor-ivacaft  1 tablet Oral At bedtime   ??? famotidine  20 mg Oral Daily   ??? FLUoxetine  60 mg Oral Daily   ??? fluticasone furoate-vilanteroL  1 puff Inhalation Daily (RT)   ??? fluticasone propionate  1 spray Each Nare Daily   ??? gabapentin  600 mg Oral Daily   ??? insulin glargine  50 Units Subcutaneous Nightly   ??? insulin lispro  0-12 Units Subcutaneous ACHS   ??? lamoTRIgine  200 mg Oral Daily   ??? lipase-protease-amylase  8 capsule Oral 3xd Meals   ??? lisinopriL  10 mg Oral Daily   ??? melatonin  10.5 mg Oral Nightly   ??? pantoprazole  20 mg Oral Daily   ??? MVW Complete (pediatric multivit 61-D3-vit K)  1 capsule Oral BID   ??? phytonadione (vitamin K1)  5 mg Oral Daily   ??? piperacillin-tazobactam (ZOSYN) IV (intermittent)  4.5 g Intravenous Q6H   ??? polyethylen glycol  17 g Oral daily   ??? pramipexole  0.5 mg Oral Q PM   ???  rivaroxaban  20 mg Oral Daily   ??? sodium chloride 7%  4 mL Nebulization TID   ??? tobramycin (NEBCIN) IVPB (conventional dosing)  260 mg Intravenous Q12H   ??? traZODone  100 mg Oral Nightly     Continuous Infusions:    Lab Results   Component Value Date    WBC 5.2 11/28/2020    HGB 12.3 (L) 11/28/2020    HCT 36.4 (L) 11/28/2020    PLT 239 11/28/2020         Lab Results   Component Value Date    PT 16.9 (H) 11/26/2020    INR 1.45 11/26/2020     Recent Labs     11/27/20  0508 11/28/20  0518   NA 136 137   K 3.7 4.0   CL 100 101   CO2 31.2* 31.9*   BUN 10 12   CREATININE 0.65 0.63   GLU 198* 193*   CALCIUM 8.7 8.8   PROT 6.5 6.4   BILITOT 0.4 0.4   AST 21 21   ALT 36 38   ALKPHOS 70 65   MG 1.6  --      Recent Labs     11/26/20  0838   INR 1.45     Pending Labs     Order Current Status    Blood Culture #1 Preliminary result    Blood Culture #2 Preliminary result          35 minutes, over 50% spent in counseling and coordination of care.  Tawni Levy MD

## 2020-11-28 NOTE — Unmapped (Signed)
Pt assisted with needs.Pain being controlled appropriately without any acute distress.Fall  precaution maintained.Encouraged to call for help when in need.Will keep monitoring.   Problem: Adult Inpatient Plan of Care  Goal: Plan of Care Review  Outcome: Progressing  Goal: Patient-Specific Goal (Individualized)  Outcome: Progressing  Goal: Absence of Hospital-Acquired Illness or Injury  Outcome: Progressing  Intervention: Identify and Manage Fall Risk  Recent Flowsheet Documentation  Taken 11/27/2020 2000 by Wonda Olds, RN  Safety Interventions:  ??? fall reduction program maintained  ??? lighting adjusted for tasks/safety  ??? low bed  Intervention: Prevent and Manage VTE (Venous Thromboembolism) Risk  Recent Flowsheet Documentation  Taken 11/27/2020 2000 by Wonda Olds, RN  Activity Management: activity adjusted per tolerance  Intervention: Prevent Infection  Recent Flowsheet Documentation  Taken 11/27/2020 2000 by Wonda Olds, RN  Infection Prevention:  ??? hand hygiene promoted  ??? personal protective equipment utilized  Goal: Optimal Comfort and Wellbeing  Outcome: Progressing  Goal: Readiness for Transition of Care  Outcome: Progressing  Goal: Rounds/Family Conference  Outcome: Progressing     Problem: Infection  Goal: Absence of Infection Signs and Symptoms  Outcome: Progressing  Intervention: Prevent or Manage Infection  Recent Flowsheet Documentation  Taken 11/27/2020 2000 by Wonda Olds, RN  Infection Management: aseptic technique maintained  Isolation Precautions: contact precautions maintained

## 2020-11-28 NOTE — Unmapped (Signed)
Pt is A&O x 4. Here with CF Exacerbation requiring IV abx. Left chest wall port-a-cath in place; CD&I at this time. PRN Oxycodone, Tylenol, and Zofran given for c/o abdominal/chest pain, headache, and nausea with relief. Remains free of falls. Contact precautions maintained per protocol. VSS.      Problem: Adult Inpatient Plan of Care  Goal: Plan of Care Review  Outcome: Progressing  Goal: Patient-Specific Goal (Individualized)  Outcome: Progressing  Goal: Absence of Hospital-Acquired Illness or Injury  Outcome: Progressing  Intervention: Identify and Manage Fall Risk  Recent Flowsheet Documentation  Taken 11/28/2020 0800 by Kennyth Arnold, RN  Safety Interventions:   fall reduction program maintained   low bed   isolation precautions  Goal: Optimal Comfort and Wellbeing  Outcome: Progressing  Goal: Readiness for Transition of Care  Outcome: Progressing  Goal: Rounds/Family Conference  Outcome: Progressing     Problem: Infection  Goal: Absence of Infection Signs and Symptoms  Outcome: Progressing  Intervention: Prevent or Manage Infection  Recent Flowsheet Documentation  Taken 11/28/2020 0800 by Kennyth Arnold, RN  Isolation Precautions: contact precautions maintained     Problem: Infection (Cystic Fibrosis)  Goal: Absence of Infection Signs and Symptoms  Outcome: Progressing  Intervention: Manage Infection and Prevent Transmission  Recent Flowsheet Documentation  Taken 11/28/2020 0800 by Kennyth Arnold, RN  Isolation Precautions: contact precautions maintained     Problem: Oral Intake Inadequate (Cystic Fibrosis)  Goal: Optimal Nutrition Intake  Outcome: Progressing     Problem: Malabsorption (Cystic Fibrosis)  Goal: Optimal Bowel Elimination  Outcome: Progressing     Problem: Airway Clearance Ineffective  Goal: Effective Airway Clearance  Outcome: Progressing     Problem: Diabetes Comorbidity  Goal: Blood Glucose Level Within Targeted Range  Outcome: Progressing     Problem: Hypertension Comorbidity  Goal: Blood Pressure in Desired Range  Outcome: Progressing

## 2020-11-28 NOTE — Unmapped (Signed)
PULMONARY CONSULT  NOTE      Patient: Christian Young([birth year])  Reason for consultation: [name] is a 35 y.o. male who is seen in consultation at the request of Rica Koyanagi, MD for comprehensive evaluation of CF exacerbation.        Assessment and Recommendations:      Principal Problem:    Cystic fibrosis with pulmonary exacerbation (CMS-HCC)  Active Problems:    Essential hypertension    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)    Pancreatic insufficiency due to cystic fibrosis (CMS-HCC)    Chronic deep vein thrombosis (DVT) of lower extremity (CMS-HCC)  Resolved Problems:    * No resolved hospital problems. *    Christian Young is a 35 y.o. male with PMHx of CF who presented to [facility] with Cystic fibrosis with pulmonary exacerbation (CMS-HCC).  ??  *Acute exacerbation of CF bronchiectasis: Pt recommended to pursue admission by his primary CF doc.  He has grown MSSA and 2 variants of PSAS (one pan resistant and one with intermediate resistance)  - CF sputum/culture, still unable to produce specimen.  - CXR PA and lateral with sequela of RUL lobectomy and chronic scarring in R base  - Zosyn 4.5 mg q6h - Note: patient has had cefepime allergy and developed AIN from Zosyn.  Will need to monitor closely  - Tobramycin IV, pharmacy to dose - Please check Mg with BMP while on Tobramycin  - Plan for at least 14 days of antibiotics.  Will likely need to be completed as an inpatient (has done both here/home, feels current supports make it difficult to complete treatment at home).  - Continue Trikafta (non-formulary), Breo (formulary alternative for Symbicort) and Pulmozyme  - Airway Christian with albuterol TID, 7% hypertonic saline, chest vest, and aerobika  - Monitor daily CBC and CMP  - Patient will require PFTs while in hospital.  Ordered for 4/19  - INR elevated, getting vit K daily for 5 days, though suspecting influenced by Xarelto use. Will complete then resume twice weekly while on IV abx.   - Continue to ensure having at least one bowel movement/day; admission earlier this year locally for ?pSBO/ileus, possible DIOS, added order for daily Miralax (patient can refuse if not needing).      We appreciate the opportunity to assist in the care of this patient.  Please page [phone] with any questions.    Christian Byars, MD    Subjective:      History of Present Illness:  [name] is a 35 y.o. male admitted for CF exacerbation.  Has had recent pleuritic chest pain and fever.  Recommended by primary CF doc to present for admission and IV Abx.    Interval history 11/28/20: Doing okay overall. Nausea and headache, not typical for patient during exacerbations. Hoping for change to IV zofran. Regular bowel movements, no abdominal pain. Eating though less than usual. No further fevers. Little sputum production.    Past medical, past surgical, family, and social histories and allergies reviewed from initial admission note and otherwise in Epic.     Review of Systems: A comprehensive review of systems was performed and was negative except as above in HPI.     Objective:      Physical Exam:  Vitals:    11/27/20 2105 11/27/20 2200 11/28/20 0840 11/28/20 0853   BP:  112/59  117/70   Pulse: 83 82 82    Resp: 18 14 18  Temp:  35.6 ??C (96 ??F)     TempSrc:  Oral     SpO2: 99% 100% 100%    Weight:       Height:           General: sitting up in chair, no respiratory distress  HEENT: anicteric sclera, moist mucous membranes  Neck: supple, no palpable LAD  CV: RRR, no murmurs, rubs, or gallops  Pulm: normal work of breathing, Good air movement. CTAB  Abd: soft, non-tender, slight distension, normal bowel sounds  Ext: warm and well perfused, no edema  Neuro: grossly intact  Psych: appropriate mood and affect    Malnutrition Assessment by RD:          Diagnostic Review:   All labs and images were personally reviewed.

## 2020-11-29 LAB — CBC W/ AUTO DIFF
BASOPHILS ABSOLUTE COUNT: 0.1 10*9/L (ref 0.0–0.1)
BASOPHILS RELATIVE PERCENT: 1.5 %
EOSINOPHILS ABSOLUTE COUNT: 0.4 10*9/L (ref 0.0–0.5)
EOSINOPHILS RELATIVE PERCENT: 5.9 %
HEMATOCRIT: 38.1 % — ABNORMAL LOW (ref 39.0–48.0)
HEMOGLOBIN: 12.9 g/dL (ref 12.9–16.5)
LYMPHOCYTES ABSOLUTE COUNT: 1.4 10*9/L (ref 1.1–3.6)
LYMPHOCYTES RELATIVE PERCENT: 23 %
MEAN CORPUSCULAR HEMOGLOBIN CONC: 33.8 g/dL (ref 32.0–36.0)
MEAN CORPUSCULAR HEMOGLOBIN: 28.8 pg (ref 25.9–32.4)
MEAN CORPUSCULAR VOLUME: 85.2 fL (ref 77.6–95.7)
MEAN PLATELET VOLUME: 7.8 fL (ref 6.8–10.7)
MONOCYTES ABSOLUTE COUNT: 0.6 10*9/L (ref 0.3–0.8)
MONOCYTES RELATIVE PERCENT: 10 %
NEUTROPHILS ABSOLUTE COUNT: 3.6 10*9/L (ref 1.8–7.8)
NEUTROPHILS RELATIVE PERCENT: 59.6 %
NUCLEATED RED BLOOD CELLS: 0 /100{WBCs} (ref <=4)
PLATELET COUNT: 288 10*9/L (ref 150–450)
RED BLOOD CELL COUNT: 4.47 10*12/L (ref 4.26–5.60)
RED CELL DISTRIBUTION WIDTH: 14.5 % (ref 12.2–15.2)
WBC ADJUSTED: 6 10*9/L (ref 3.6–11.2)

## 2020-11-29 LAB — COMPREHENSIVE METABOLIC PANEL
ALBUMIN: 3.6 g/dL (ref 3.4–5.0)
ALKALINE PHOSPHATASE: 68 U/L (ref 46–116)
ALT (SGPT): 43 U/L (ref 10–49)
ANION GAP: 4 mmol/L — ABNORMAL LOW (ref 5–14)
AST (SGOT): 23 U/L (ref <=34)
BILIRUBIN TOTAL: 0.4 mg/dL (ref 0.3–1.2)
BLOOD UREA NITROGEN: 14 mg/dL (ref 9–23)
BUN / CREAT RATIO: 17
CALCIUM: 9.4 mg/dL (ref 8.7–10.4)
CHLORIDE: 99 mmol/L (ref 98–107)
CO2: 32.1 mmol/L — ABNORMAL HIGH (ref 20.0–31.0)
CREATININE: 0.82 mg/dL
EGFR CKD-EPI AA MALE: >90 mL/min/{1.73_m2} (ref >=60)
EGFR CKD-EPI NON-AA MALE: >90 mL/min/{1.73_m2} (ref >=60)
GLUCOSE RANDOM: 150 mg/dL (ref 70–179)
POTASSIUM: 4.3 mmol/L (ref 3.4–4.8)
PROTEIN TOTAL: 6.6 g/dL (ref 5.7–8.2)
SODIUM: 135 mmol/L (ref 135–145)

## 2020-11-29 LAB — HEMOGLOBIN A1C
ESTIMATED AVERAGE GLUCOSE: 260 mg/dL
HEMOGLOBIN A1C: 10.7 % — ABNORMAL HIGH (ref 4.8–5.6)

## 2020-11-29 LAB — MAGNESIUM: MAGNESIUM: 1.9 mg/dL (ref 1.6–2.6)

## 2020-11-29 MED ADMIN — phytonadione (vitamin K1) (MEPHYTON) tablet 5 mg: 5 mg | ORAL | @ 14:00:00 | Stop: 2020-12-01

## 2020-11-29 MED ADMIN — polyethylene glycol (MIRALAX) packet 17 g: 17 g | ORAL | @ 17:00:00

## 2020-11-29 MED ADMIN — insulin glargine (LANTUS) injection 50 Units: 50 [IU] | SUBCUTANEOUS | @ 02:00:00

## 2020-11-29 MED ADMIN — oxyCODONE (ROXICODONE) immediate release tablet 5 mg: 5 mg | ORAL | @ 14:00:00 | Stop: 2020-11-29

## 2020-11-29 MED ADMIN — insulin lispro (HumaLOG) injection 0-12 Units: 0-12 [IU] | SUBCUTANEOUS | @ 22:00:00

## 2020-11-29 MED ADMIN — amitriptyline (ELAVIL) tablet 100 mg: 100 mg | ORAL | @ 02:00:00

## 2020-11-29 MED ADMIN — pediatric multivitamin-vit D3 1,500 unit-vit K 800 mcg (MVW COMPLETE FORMULATION) capsule: 1 | ORAL | @ 14:00:00

## 2020-11-29 MED ADMIN — melatonin tablet 10.5 mg: 10 mg | ORAL | @ 02:00:00

## 2020-11-29 MED ADMIN — tobramycin (NEBCIN) 260 mg in sodium chloride (NS) 0.9 % 100 mL IVPB: 260 mg | INTRAVENOUS | @ 14:00:00 | Stop: 2020-12-07

## 2020-11-29 MED ADMIN — fluticasone furoate-vilanteroL (BREO ELLIPTA) 200-25 mcg/dose inhaler 1 puff: 1 | RESPIRATORY_TRACT | @ 13:00:00

## 2020-11-29 MED ADMIN — dornase alfa (PULMOZYME) 1 mg/mL solution 2.5 mg: 2.5 mg | RESPIRATORY_TRACT | @ 02:00:00

## 2020-11-29 MED ADMIN — pantoprazole (PROTONIX) EC tablet 20 mg: 20 mg | ORAL | @ 14:00:00

## 2020-11-29 MED ADMIN — lisinopriL (PRINIVIL,ZESTRIL) tablet 10 mg: 10 mg | ORAL | @ 14:00:00

## 2020-11-29 MED ADMIN — sodium chloride 7% nebulizer solution 4 mL: 4 mL | RESPIRATORY_TRACT | @ 13:00:00

## 2020-11-29 MED ADMIN — cetirizine (ZyrTEC) tablet 10 mg: 10 mg | ORAL | @ 14:00:00

## 2020-11-29 MED ADMIN — oxyCODONE (ROXICODONE) immediate release tablet 5 mg: 5 mg | ORAL | @ 02:00:00 | Stop: 2020-12-08

## 2020-11-29 MED ADMIN — ondansetron (ZOFRAN) injection 4 mg: 4 mg | INTRAVENOUS | @ 17:00:00

## 2020-11-29 MED ADMIN — lipase-protease-amylase (CREON) 36,000-114,000- 180,000 unit capsule 288,000 units of lipase **PATIENT-SUPPLIED**: 8 | ORAL | @ 14:00:00

## 2020-11-29 MED ADMIN — pramipexole (MIRAPEX) tablet 0.5 mg: .5 mg | ORAL | @ 22:00:00

## 2020-11-29 MED ADMIN — piperacillin-tazobactam (ZOSYN) IVPB (premix) 4.5 g: 4.5 g | INTRAVENOUS | @ 05:00:00 | Stop: 2020-12-09

## 2020-11-29 MED ADMIN — traZODone (DESYREL) tablet 100 mg: 100 mg | ORAL | @ 02:00:00

## 2020-11-29 MED ADMIN — insulin lispro (HumaLOG) injection 0-12 Units: 0-12 [IU] | SUBCUTANEOUS | @ 02:00:00

## 2020-11-29 MED ADMIN — acetaminophen (TYLENOL) tablet 650 mg: 650 mg | ORAL | @ 02:00:00

## 2020-11-29 MED ADMIN — elexacaftor-tezacaftor-ivacaft (TRIKAFTA) tablet 2 tablet **Patient Supplied**: 2 | ORAL | @ 14:00:00

## 2020-11-29 MED ADMIN — albuterol nebulizer solution 2.5 mg: 2.5 mg | RESPIRATORY_TRACT | @ 18:00:00

## 2020-11-29 MED ADMIN — rivaroxaban (XARELTO) tablet 20 mg: 20 mg | ORAL | @ 22:00:00

## 2020-11-29 MED ADMIN — tobramycin (NEBCIN) 260 mg in sodium chloride (NS) 0.9 % 100 mL IVPB: 260 mg | INTRAVENOUS | @ 02:00:00 | Stop: 2020-12-07

## 2020-11-29 MED ADMIN — fluticasone propionate (FLONASE) 50 mcg/actuation nasal spray 1 spray: 1 | NASAL | @ 14:00:00

## 2020-11-29 MED ADMIN — piperacillin-tazobactam (ZOSYN) IVPB (premix) 4.5 g: 4.5 g | INTRAVENOUS | @ 10:00:00 | Stop: 2020-12-09

## 2020-11-29 MED ADMIN — famotidine (PEPCID) tablet 20 mg: 20 mg | ORAL | @ 14:00:00

## 2020-11-29 MED ADMIN — albuterol nebulizer solution 2.5 mg: 2.5 mg | RESPIRATORY_TRACT | @ 13:00:00

## 2020-11-29 MED ADMIN — lipase-protease-amylase (CREON) 36,000-114,000- 180,000 unit capsule 288,000 units of lipase **PATIENT-SUPPLIED**: 8 | ORAL | @ 17:00:00

## 2020-11-29 MED ADMIN — FLUoxetine (PROzac) capsule 60 mg: 60 mg | ORAL | @ 14:00:00

## 2020-11-29 MED ADMIN — pediatric multivitamin-vit D3 1,500 unit-vit K 800 mcg (MVW COMPLETE FORMULATION) capsule: 1 | ORAL | @ 02:00:00

## 2020-11-29 MED ADMIN — gabapentin (NEURONTIN) capsule 600 mg: 600 mg | ORAL | @ 14:00:00

## 2020-11-29 MED ADMIN — atorvastatin (LIPITOR) tablet 20 mg: 20 mg | ORAL | @ 02:00:00

## 2020-11-29 MED ADMIN — sodium chloride 7% nebulizer solution 4 mL: 4 mL | RESPIRATORY_TRACT | @ 02:00:00

## 2020-11-29 MED ADMIN — lipase-protease-amylase (CREON) 36,000-114,000- 180,000 unit capsule 288,000 units of lipase **PATIENT-SUPPLIED**: 8 | ORAL | @ 22:00:00

## 2020-11-29 MED ADMIN — piperacillin-tazobactam (ZOSYN) IVPB (premix) 4.5 g: 4.5 g | INTRAVENOUS | @ 17:00:00 | Stop: 2020-12-09

## 2020-11-29 MED ADMIN — sodium chloride 7% nebulizer solution 4 mL: 4 mL | RESPIRATORY_TRACT | @ 18:00:00

## 2020-11-29 MED ADMIN — piperacillin-tazobactam (ZOSYN) IVPB (premix) 4.5 g: 4.5 g | INTRAVENOUS | Stop: 2020-12-09

## 2020-11-29 MED ADMIN — dornase alfa (PULMOZYME) 1 mg/mL solution 2.5 mg: 2.5 mg | RESPIRATORY_TRACT | @ 13:00:00

## 2020-11-29 MED ADMIN — albuterol nebulizer solution 2.5 mg: 2.5 mg | RESPIRATORY_TRACT | @ 02:00:00

## 2020-11-29 MED ADMIN — insulin lispro (HumaLOG) injection 0-12 Units: 0-12 [IU] | SUBCUTANEOUS | @ 17:00:00

## 2020-11-29 MED ADMIN — lamoTRIgine (LaMICtal) tablet 200 mg: 200 mg | ORAL | @ 14:00:00

## 2020-11-29 MED ADMIN — elexacaftor-tezacaftor-ivacaft (TRIKAFTA) tablet 1 tablet: 1 | ORAL | @ 02:00:00

## 2020-11-29 NOTE — Unmapped (Addendum)
Christian Young is a 34yoM with CF admitted with Cystic fibrosis with pulmonary exacerbation manifested as fever, cough and chest pain. He was sent as direct admission by his primary pulmonologist for course of IV abx which was completed inpatient due to inadequate home support. Hospital course as outlined:     Acute exacerbation of CF bronchiectasis: Admitted for 14d IV abx. Hx MSSA and 2 variants of PSAS (one pan resistant and one with intermediate resistance). AFB smear negative, blood cultures negative. Pt unable to produce specimen for new culture.  MRSA screen negative. [facility] Pulmonology consult provided input throughout hospitalization. Provided Zosyn 4.5 mg q6h. Labs were monitored daily as noted that pt has cefepime allergy and developed AIN from Zosyn in past. Also provided Tobramycin IV and monitored Mg with BMP while on Tobramycin. Continued home CF/airway clearance meds including Trikafta (non-form), Breo (for Symbicort) and Pulmozyme, airway clearance with albuterol TID, 7% hypertonic saline, chest vest, and aerobika. PFTs 12/01/20 unchanged from prior. Pt completed 14d IV abx on 4/27***.    INR elevated to 1.8 despite vitamin K daily for 5 days, likely r/t Xarelto. Resumed vit K dose twice weekly while on IV abx.      Diabetes mellitus related to cystic fibrosis: Blood sugars intermittently elevated on high-calorie high-protein diet. Provided Lantus 50u nightly, SSI.      Pancreatic insufficiency due to CF: Creon 8/meals, 4/snacks. ADEK vitamins  Hx possible pSBO/ileus vs DIOS. Provided miralax, senna and encouraged pt to have daily BM (rather than his typical QOD)  Hx DVT: continued Xarelto 20, vitamin K twice weekly while on IV abx.   Mild hypokalemia, hypomagnesium: Repleted PO/IV intermittently

## 2020-11-29 NOTE — Unmapped (Signed)
Cystic Fibrosis Nutrition Assessment    Inpatient, Telehealth via phone: MD Consult this admission and related follow up  Primary Pulmonary Provider: Dr [name]   ===================================================================  Christian Young is a 35 y.o. male w/ PMH significant for CF, CFRD, pancreatic insufficiency who presented on 4/14 with CF exacerbation. Plan for IV abx.    4/19: Christian Young remains admitted for IV antibiotics. Intermittent nausea requiring zofran. On High Calorie High Protein Diet, consuming 50-75% of provided meals per [employer]. Last documented BM yesterday, 4/18. Requested several snacks today.  ===================================================================  INTERVENTION:    1. Continue High Calorie High Protein Diet    2. PT elevated; on Vit K 5mg  daily  - defer to team given xarelto    3. Recommend CF vitamin regimen:  MVW Complete Formulation gel cap 2 daily as ordered    4. Weigh patient twice weekly this admit    5. Ordered snacks in computrition per pt request: Lays, peanut butter and jelly, fruit, cheese and crackers    6. Continue remainder of nutrition regimen:  - enzyme regimen; Creon 36,000 x 8 caps/meal, 4 caps/snack  - acid reducer; pepcid, protonix  - bowel regimen; miralax daily, senna PRN  - anti-nausea; zofran PRN  - insulin regimen per endo    Inpatient:   Will follow up with patient per protocol: 1-2 times per week (and more frequent as indicated)  Monitor for potential discharge needs with multi-disciplinary team.   ===================================================================  ASSESSMENT:  Nutrition Category = Adult Class 1 Obesity: BMI 30 to < 35 kg/m2    Estimated daily needs: 3717 kcal/day, 126-169 g PRO/day, 3216 mL fluid/day  Calories estimated using Cystic Fibrosis Conference Formula, protein per DRI x 1.5-2, fluid per [facility]    Current diet is appropriate for CF. Current PO intake is adequate to meet estimated CF needs. Patient continues to work towards goals for weight management.   Enzyme dose is within established guidelines. Vitamin prescription is appropriate to reach/maintain optimal fat soluble vitamin levels. Patient may benefit from vitamin K supplementation while on IV antibiotics. Sodium needs for CF met with PO and/or supplement. Bowel regimen is appropriate. Acid reducer appropriate for GERD and enzyme activation. Patient on CFTR modulator is consuming adequate amounts of fat-containing foods with prescribed medication to optimize absorption.    ASPEN/AND Malnutrition Screening:  Patient does not meet ASPEN/AND criteria for malnutrition at this time.    Nutrition Focused Physical Exam:   Assessment not indicated d/t lack of malnutrition risk factors.    Goals:  1. Ongoing:  Meet estimated daily needs  2. Ongoing:  Reach/maintain established anthropometric goals for Adult CF: BMI < 30 kg/m2   3. Ongoing:  Normal fat-soluble vitamin levels: Vitamin A, Vitamin E and PT per lab range; Vitamin D 25OH total >30   4. Ongoing:  Maintain glucose control. Carbohydrate content of diet should comprise 40-50% of total calorie needs, but carbohydrates are not restricted in this population.    5. Ongoing:  Meet sodium needs for CF     Nutrition goals reviewed, and relevant barriers identified and addressed: none evident.   Patient is evaluated to have good  willingness and ability to achieve nutrition goals.   ===================================================================  INPATIENT:  Christian Young is admitted with Bronchiectasis with acute exacerbation (CMS-HCC) [J47.1]  Shortness of breath [R06.02]  Cough [R05.9]  Cystic fibrosis with pulmonary exacerbation (CMS-HCC) [E84.0]  Chest pain, unspecified type [R07.9].    Current Nutrition Orders (inpatient):  Oral intake  Nutrition Orders   (From admission, onward)             Start     Ordered    11/25/20 1440  Nutrition Therapy High Calorie High Protein  Effective now Question:  Nutrition Therapy:  Answer:  High Calorie High Protein    11/25/20 1439              CF Nutrition related medications (inpatient): Nutritionally relevant medications reviewed.  lipitor  trikafta  pepcid  Insulin regimen  Creon 36,000 x 8 caps/meal, 4 caps/snack  protonix  MVW complete formulation gel cap BID  5mg  Vit K daily x 5 days  miralax once daily  PRN senna  PRN zofran    CF Nutrition related labs (inpatient):   Nutritionally pertinent labs reviewed.   ==================================================================  CLINICAL DATA:  Past Medical History:   Diagnosis Date   ??? Anxiety    ??? Chronic pain disorder    ??? Cystic fibrosis (CMS-HCC)    ??? Depression    ??? Hypertension    ??? Nonproductive cough 04/05/2018     Anthroprometric Evaluation:  Weight changes: per EPIC wt hx, pt has experienced 21 lb weight loss over the past year; intentional  CFTR modulator and weight change: On Trikafta  BMI Readings from Last 3 Encounters:   11/24/20 30.28 kg/m??   06/28/20 31.64 kg/m??   01/04/20 33.09 kg/m??     Wt Readings from Last 3 Encounters:   11/24/20 (!) 101.3 kg (223 lb 4.8 oz)   06/28/20 (!) 105.8 kg (233 lb 4.8 oz)   01/04/20 (!) 110.7 kg (244 lb)     Ht Readings from Last 3 Encounters:   11/24/20 182.9 cm (6' 0.01)   06/28/20 182.9 cm (6')   01/04/20 182.9 cm (6')     ==================================================================  Energy Intake (outpatient):  Diet: Christian Young endorses stable appetite and PO intake PTA, no deviation from baseline. Weight loss achieved by consuming smaller portion sizes.     Allergies, Intolerances, Sensitivities, and/or Cultural/Religious Dietary Restrictions:  See below  Allergies   Allergen Reactions   ??? Aztreonam Anaphylaxis, Hives, Nausea And Vomiting and Rash     fevers  Patient stated that he only vomited x1 with [place] in the past.????    fevers  Patient stated that he only vomited x1 with [place] in the past.????   ??? [place] [Aztreonam Lysine] Anaphylaxis   ??? Cefepime Itching, Nausea Only and Other (See Comments)     Headaches also   ??? Other Anaphylaxis and Other (See Comments)     Other reaction(s): Other (See Comments)  Bananas: itchy throat  Slo Bid record from [employer] states anaphylaxis.????Pt states this was from childhood and does not know reaction.  Bananas, causes itchy throat   ??? Slo-Bid 100 Anaphylaxis   ??? Tobramycin Tinnitus     From OSH record-documented as tinnitus but has received IV tobra with close monitoring.  Pt has [facility]ram[facility] since this allergy documented    From OSH record-documented as tinnitus but has received IV tobra with close monitoring.   ??? Banana Itching and Nausea And Vomiting   Sodium in diet: Adequate from diet  Calcium in diet:  Adequate from diet  Food Insecurity:    -   Food Insecurity: No Food Insecurity   ??? Worried About Programme researcher, broadcasting/film/video in the Last Year: Never true   ??? Ran Out of Food in the Last Year: Never true   CFTR modulator  and Diet: Prescribed Trikafta (elexacaftor/tezacaftor/ivacaftor).  PO Supplements: none  Patient resources for DME/formula: n/a  Appetite Stimulant: none  Enteral feeding tube: none  Physical activity: limited recently 2/2 back pain    GI/Malabsorption (outpatient):  Enzyme brand, (meals/snacks):  Creon 36,000 @ 8/meal and 4/snack  Enzyme administration details: correct pre-meal administration., good compliance at all meals and snacks  Enzyme dose per MEAL (units lipase/kg/meal) 2843  Enzyme dose per DAY (units lipase/kg/day) 16109  GI meds:  Nutritionally relevant medications reviewed.  Stools (steatorrhea): 1-2 daily, not greasy  Stools (constipation): no s/s of constipation  GI symptoms:  none  Fecal Fat Studies: pancreatic insufficient    No results found for: [id number]  Lab Results   Component Value Date    ELAST <15 (L) 03/19/2017     No results found for: PELAI    Vitamins/Minerals (outpatient):  CF-specific MVI, dose, compliance: MVW Complete Formulation Softgel regular 2 daily, good compliance  Other vitamins/minerals/herbals: none  Patient Resources for vitamins: -none  Calcium supplement: none  Fat-soluble vitamin levels: low vitamin D on last draw  Lab Results   Component Value Date    VITAMINA 42.4 01/18/2020    VITAMINA 44.4 11/19/2016     Lab Results   Component Value Date    CRP <4.0 11/24/2020    CRP <4.0 06/28/2020    CRP 3 01/18/2020     Lab Results   Component Value Date    VITDTOTAL 27.9 03/19/2017     No results found for: VITAME  Lab Results   Component Value Date    PT 16.9 (H) 11/26/2020    PT 20.7 (H) 06/28/2020    PT 18.7 (H) 01/29/2019     Lab Results   Component Value Date    DESGCARBPT 0.2 03/20/2017     No results found for: PIVKAII    Bone Health: Abnormal vitamin D level, being treated. Adequate calcium intake from diet. Due for DEXA next year. Normal bone density July 2018.     CF Related Diabetes: yes, on insulin    Lab Results   Component Value Date    GLUF 178 (H) 01/18/2020     No results found for: GLUCOSE2HR  Lab Results   Component Value Date    A1C 9.4 (H) 01/18/2020    A1C 8.8 (H) 09/29/2019    A1C 8.5 (H) 08/12/2018     Jackqulyn Livings MPH, RD, LDN  Pager: [phone]

## 2020-11-29 NOTE — Unmapped (Signed)
Hospitalist Daily Progress Note     LOS: 5 days       Assessment/Plan:  Principal Problem:    Cystic fibrosis with pulmonary exacerbation (CMS-HCC)  Active Problems:    Essential hypertension    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)    Pancreatic insufficiency due to cystic fibrosis (CMS-HCC)    Chronic deep vein thrombosis (DVT) of lower extremity (CMS-HCC)  Resolved Problems:    * No resolved hospital problems. *         Christian Young is a 35 y.o. male with PMHx of CF who presented to [facility] with Cystic fibrosis with pulmonary exacerbation (CMS-HCC).  MRSA screen negative    *Acute exacerbation of CF bronchiectasis: Continued improvement.  CF culture shows both smooth and mucoid Pseudomonas aeruginosa.  - Pulmonology consult in place, appreciated.   - Zosyn 4.5 mg q6h - Note: patient has had cefepime allergy and developed AIN from Zosyn.    Continue to monitor closely, CMP and CBC daily.  - Tobramycin IV, pharmacy to dose, 14 days ordered at this time, no diarrhea, no report of allergic reaction  - Continue Trikafta (non-formulary), Breo (formulary alternative for Symbicort) and Pulmozyme  - Airway clearance with albuterol TID, 7% hypertonic saline, chest vest, and [place]  - Patient will require PFTs while in hospital (not yet ordered), timing to be determined based upon clinical condition and duration of antibiotic therapy.  ??-Follow-up INR 4/20 after supplemental vitamin K given for elevated INR 1.45.  Rivaroxaban could be increasing INR.  -AFB smear negative, blood cultures negative at 4 days.    *Diabetes mellitus related to cystic fibrosis: Blood sugars from 1 12-2 97, he does eat some single large meals.  - SSI + Accuchecks ACHS   - Lantus 50 units nightly, sliding scale..  The patient is on a high-calorie high-protein diet.   ???Encourage minimizing and/or spreading out throughout the day frankly sweet, sugary simple carbohydrate containing foods and beverages.  -Diabetes education.    *Pancreatic insufficiency due to CF:  - Continue Creon 8 capsules TID with meals and 4 capsules prn snacks  - Vitamins ADEK, comments as above regarding vitamin K.    *History of DVT: continue Xarelto 20 mg daily.  No abnormal bleeding reported.  ??  CARE CHECK LIST:  - FEN/GI: regular diet, IVFs prn  - PPx: home Xarelto  - Code: Full  ___________________________________________________________________  Scheduled Meds:  ??? albuterol  2.5 mg Nebulization TID   ??? amitriptyline  100 mg Oral Nightly   ??? atorvastatin  20 mg Oral Nightly   ??? cetirizine  10 mg Oral Daily   ??? dornase alfa  2.5 mg Inhalation BID   ??? elexacaftor-tezacaftor-ivacaft  2 tablet Oral Daily    And   ??? elexacaftor-tezacaftor-ivacaft  1 tablet Oral At bedtime   ??? famotidine  20 mg Oral Daily   ??? FLUoxetine  60 mg Oral Daily   ??? fluticasone furoate-vilanteroL  1 puff Inhalation Daily (RT)   ??? fluticasone propionate  1 spray Each Nare Daily   ??? gabapentin  600 mg Oral Daily   ??? insulin glargine  50 Units Subcutaneous Nightly   ??? insulin lispro  0-12 Units Subcutaneous ACHS   ??? lamoTRIgine  200 mg Oral Daily   ??? lipase-protease-amylase  8 capsule Oral 3xd Meals   ??? lisinopriL  10 mg Oral Daily   ??? melatonin  10.5 mg Oral Nightly   ??? pantoprazole  20 mg Oral Daily   ???  MVW Complete (pediatric multivit 61-D3-vit K)  1 capsule Oral BID   ??? phytonadione (vitamin K1)  5 mg Oral Daily   ??? piperacillin-tazobactam (ZOSYN) IV (intermittent)  4.5 g Intravenous Q6H   ??? polyethylen glycol  17 g Oral daily   ??? pramipexole  0.5 mg Oral Q PM   ??? rivaroxaban  20 mg Oral Daily   ??? sodium chloride 7%  4 mL Nebulization TID   ??? tobramycin (NEBCIN) IVPB (conventional dosing)  260 mg Intravenous Q12H   ??? traZODone  100 mg Oral Nightly     Continuous Infusions:  PRN Meds:.acetaminophen, albuterol, dextrose in water, lipase-protease-amylase, ondansetron, oxyCODONE, senna    *DVT prophylaxis: He is fully ambulatory.    *Disposition: At this time, the plan is to remain in house for at least 14 days of intravenous antibiotics      Please page the [facility]) pager at [phone] with questions.    Consultants:   1.  Pulmonology    Subjective:   No complaint of chest pain.  Getting ready to eat a large breakfast sandwich.  No complaint of fever, chills, worsening cough.    Objective:     Vital signs in last 24 hours:  Temp:  [36.6 ??C (97.9 ??F)-36.8 ??C (98.2 ??F)] 36.6 ??C (97.9 ??F)  Heart Rate:  [69-82] 71  Resp:  [18-24] 20  BP: (104-110)/(70-78) 110/78  MAP (mmHg):  [80-89] 89  FiO2 (%):  [21 %] 21 %  SpO2:  [97 %-98 %] 98 %      Physical Exam: Vitals as above.  General: Alert, conversant, no acute distress.  HEENT: anicteric sclera, moist mucous membranes  CV: RRR, no murmurs, rubs, or gallops  Pulm: Normal work of breathing, no adventitial sounds.  Abd: soft, non-tender, non-distended  Ext: No cyanosis, clubbing or edema.  Neuro: Speech fluent.  No acute focal motor deficits.  Psych: Friendly, interactive, answers all questions appropriately.    Medications:   Scheduled Meds:  ??? albuterol  2.5 mg Nebulization TID   ??? amitriptyline  100 mg Oral Nightly   ??? atorvastatin  20 mg Oral Nightly   ??? cetirizine  10 mg Oral Daily   ??? dornase alfa  2.5 mg Inhalation BID   ??? elexacaftor-tezacaftor-ivacaft  2 tablet Oral Daily    And   ??? elexacaftor-tezacaftor-ivacaft  1 tablet Oral At bedtime   ??? famotidine  20 mg Oral Daily   ??? FLUoxetine  60 mg Oral Daily   ??? fluticasone furoate-vilanteroL  1 puff Inhalation Daily (RT)   ??? fluticasone propionate  1 spray Each Nare Daily   ??? gabapentin  600 mg Oral Daily   ??? insulin glargine  50 Units Subcutaneous Nightly   ??? insulin lispro  0-12 Units Subcutaneous ACHS   ??? lamoTRIgine  200 mg Oral Daily   ??? lipase-protease-amylase  8 capsule Oral 3xd Meals   ??? lisinopriL  10 mg Oral Daily   ??? melatonin  10.5 mg Oral Nightly   ??? pantoprazole  20 mg Oral Daily   ??? MVW Complete (pediatric multivit 61-D3-vit K)  1 capsule Oral BID   ??? phytonadione (vitamin K1)  5 mg Oral Daily   ??? piperacillin-tazobactam (ZOSYN) IV (intermittent)  4.5 g Intravenous Q6H   ??? polyethylen glycol  17 g Oral daily   ??? pramipexole  0.5 mg Oral Q PM   ??? rivaroxaban  20 mg Oral Daily   ??? sodium chloride 7%  4 mL  Nebulization TID   ??? tobramycin (NEBCIN) IVPB (conventional dosing)  260 mg Intravenous Q12H   ??? traZODone  100 mg Oral Nightly     Continuous Infusions:    Lab Results   Component Value Date    WBC 6.0 11/29/2020    HGB 12.9 11/29/2020    HCT 38.1 (L) 11/29/2020    PLT 288 11/29/2020         Lab Results   Component Value Date    PT 16.9 (H) 11/26/2020    INR 1.45 11/26/2020     Recent Labs     11/29/20  0616   NA 135   K 4.3   CL 99   CO2 32.1*   BUN 14   CREATININE 0.82   GLU 150   CALCIUM 9.4   PROT 6.6   BILITOT 0.4   AST 23   ALT 43   ALKPHOS 68   MG 1.9     25 minutes, over 50% spent in counseling and coordination of care.  Tawni Levy MD

## 2020-11-29 NOTE — Unmapped (Signed)
Pt remains on RA. All scheduled inhaled medications received.Pt using home chest vest for airway clearance. No apparent distress noted at this time.

## 2020-11-29 NOTE — Unmapped (Signed)
PULMONARY CONSULT  NOTE      Patient: Christian Young([birth year])  Reason for consultation: Christian Young is a 35 y.o. male who is seen in consultation at the request of Rica Koyanagi, MD for comprehensive evaluation of CF exacerbation.        Assessment and Recommendations:      Principal Problem:    Cystic fibrosis with pulmonary exacerbation (CMS-HCC)  Active Problems:    Essential hypertension    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)    Pancreatic insufficiency due to cystic fibrosis (CMS-HCC)    Chronic deep vein thrombosis (DVT) of lower extremity (CMS-HCC)  Resolved Problems:    * No resolved hospital problems. *    Christian Young is a 35 y.o. male with PMHx of CF who presented to [facility] with Cystic fibrosis with pulmonary exacerbation (CMS-HCC).  ??  *Acute exacerbation of CF bronchiectasis: Pt recommended to pursue admission by his primary CF doc.  He has grown MSSA and 2 variants of PSAS (one pan resistant and one with intermediate resistance)  - CF sputum/culture, still unable to produce specimen.  - Zosyn 4.5 mg q6h.  - Tobramycin IV, pharmacy to dose, Mg with BMP while on Tobramycin.  - Plan for at least 14 days of antibiotics.  Will likely need to be completed as an inpatient (has done both here/home, feels current supports make it difficult to complete treatment at home).  - Continue Trikafta (non-formulary), Breo (formulary alternative for Symbicort) and Pulmozyme  - Airway clearance with albuterol TID, 7% hypertonic saline, chest vest, and aerobika  - Patient will require PFTs while in hospital.  Ordered for 4/19  - INR elevated, getting vit K daily for 5 days, though suspecting influenced by Xarelto use. Will complete then resume twice weekly while on IV abx.   - Continue to ensure having at least one bowel movement/day; admission earlier this year locally for ?pSBO/ileus, possible DIOS, daily Miralax (patient can refuse if not needing).      We appreciate the opportunity to assist in the care of this patient.  Please page [phone] with any questions.    Cecelia Byars, MD    Subjective:      History of Present Illness:  Christian Young is a 35 y.o. male admitted for CF exacerbation.  Has had recent pleuritic chest pain and fever.  Recommended by primary CF doc to present for admission and IV Abx.    Interval history 11/29/20: Doing okay overall. Reports nausea and headache again this morning but improving on own, not needing Zofran, ate breakfast okay. No constipation or abdominal pain. Airway clearance going okay, minimal sputum.    Past medical, past surgical, family, and social histories and allergies reviewed from initial admission note and otherwise in Epic.     Review of Systems: A comprehensive review of systems was performed and was negative except as above in HPI.     Objective:      Physical Exam:  Vitals:    11/28/20 2101 11/28/20 2135 11/29/20 0901 11/29/20 0912   BP: 104/70  (P) 110/78    Pulse: 82 81 (P) 69 71   Resp: 18 18 (P) 24 20   Temp: 36.8 ??C (98.2 ??F)  (P) 36.6 ??C (97.9 ??F)    TempSrc: Oral  (P) Oral    SpO2: 97% 98%  98%   Weight:       Height:           General: sitting  up in chair, no respiratory distress  HEENT: anicteric sclera, moist mucous membranes  CV: RRR, no murmurs, rubs, or gallops  Pulm: normal work of breathing, Good air movement. CTAB  Abd: soft, non-tender, slight distension, normal bowel sounds  Ext: warm and well perfused, no edema  Neuro: grossly intact  Psych: appropriate mood and affect    Malnutrition Assessment by RD:          Diagnostic Review:   All labs and images were personally reviewed.

## 2020-11-29 NOTE — Unmapped (Signed)
A/Ox4, VSS, O2 Sats maintained on RA, pain managed with PRN PO meds X 1 this shift, no c/o nausea, PIV abx via a port administered per orders, no respiratory distress noted to night, will continue to monitor closely.    Problem: Adult Inpatient Plan of Care  Goal: Plan of Care Review  Outcome: Progressing  Goal: Patient-Specific Goal (Individualized)  Outcome: Progressing  Goal: Absence of Hospital-Acquired Illness or Injury  Outcome: Progressing  Intervention: Identify and Manage Fall Risk  Recent Flowsheet Documentation  Taken 11/28/2020 2000 by Ander Purpura, RN  Safety Interventions:   low bed   fall reduction program maintained  Goal: Optimal Comfort and Wellbeing  Outcome: Progressing  Goal: Readiness for Transition of Care  Outcome: Progressing  Goal: Rounds/Family Conference  Outcome: Progressing     Problem: Infection  Goal: Absence of Infection Signs and Symptoms  Outcome: Progressing  Intervention: Prevent or Manage Infection  Recent Flowsheet Documentation  Taken 11/29/2020 0000 by Ander Purpura, RN  Isolation Precautions: contact precautions maintained  Taken 11/28/2020 2200 by Ander Purpura, RN  Isolation Precautions: contact precautions maintained  Taken 11/28/2020 2000 by Ander Purpura, RN  Isolation Precautions: contact precautions maintained     Problem: Adjustment to Illness (Cystic Fibrosis)  Goal: Optimal Coping  Outcome: Progressing     Problem: Infection (Cystic Fibrosis)  Goal: Absence of Infection Signs and Symptoms  Outcome: Progressing  Intervention: Manage Infection and Prevent Transmission  Recent Flowsheet Documentation  Taken 11/29/2020 0000 by Ander Purpura, RN  Isolation Precautions: contact precautions maintained  Taken 11/28/2020 2200 by Ander Purpura, RN  Isolation Precautions: contact precautions maintained  Taken 11/28/2020 2000 by Ander Purpura, RN  Isolation Precautions: contact precautions maintained     Problem: Oral Intake Inadequate (Cystic Fibrosis)  Goal: Optimal Nutrition Intake  Outcome: Progressing     Problem: Malabsorption (Cystic Fibrosis)  Goal: Optimal Bowel Elimination  Outcome: Progressing     Problem: Respiratory Compromise (Cystic Fibrosis)  Goal: Effective Oxygenation and Ventilation  Outcome: Progressing  Intervention: Optimize Oxygenation and Ventilation  Recent Flowsheet Documentation  Taken 11/28/2020 2000 by Ander Purpura, RN  Head of Bed (HOB) Positioning: HOB at 20-30 degrees     Problem: Airway Clearance Ineffective  Goal: Effective Airway Clearance  Outcome: Progressing     Problem: Diabetes Comorbidity  Goal: Blood Glucose Level Within Targeted Range  Outcome: Progressing     Problem: Hypertension Comorbidity  Goal: Blood Pressure in Desired Range  Outcome: Progressing

## 2020-11-30 LAB — TOBRAMYCIN LEVEL, RANDOM: TOBRAMYCIN RANDOM: 0.9 ug/mL

## 2020-11-30 LAB — COMPREHENSIVE METABOLIC PANEL
ALBUMIN: 3.8 g/dL (ref 3.4–5.0)
ALKALINE PHOSPHATASE: 63 U/L (ref 46–116)
ALT (SGPT): 37 U/L (ref 10–49)
ANION GAP: 7 mmol/L (ref 5–14)
AST (SGOT): 23 U/L (ref <=34)
BILIRUBIN TOTAL: 0.5 mg/dL (ref 0.3–1.2)
BLOOD UREA NITROGEN: 15 mg/dL (ref 9–23)
BUN / CREAT RATIO: 19
CALCIUM: 9.2 mg/dL (ref 8.7–10.4)
CHLORIDE: 99 mmol/L (ref 98–107)
CO2: 31.2 mmol/L — ABNORMAL HIGH (ref 20.0–31.0)
CREATININE: 0.78 mg/dL
EGFR CKD-EPI AA MALE: >90 mL/min/{1.73_m2} (ref >=60)
EGFR CKD-EPI NON-AA MALE: >90 mL/min/{1.73_m2} (ref >=60)
GLUCOSE RANDOM: 125 mg/dL (ref 70–179)
POTASSIUM: 3.9 mmol/L (ref 3.4–4.8)
PROTEIN TOTAL: 7.2 g/dL (ref 5.7–8.2)
SODIUM: 137 mmol/L (ref 135–145)

## 2020-11-30 LAB — CBC W/ AUTO DIFF
BASOPHILS ABSOLUTE COUNT: 0 10*9/L (ref 0.0–0.1)
BASOPHILS RELATIVE PERCENT: 0.5 %
EOSINOPHILS ABSOLUTE COUNT: 0.3 10*9/L (ref 0.0–0.5)
EOSINOPHILS RELATIVE PERCENT: 5.3 %
HEMATOCRIT: 38.9 % — ABNORMAL LOW (ref 39.0–48.0)
HEMOGLOBIN: 13.1 g/dL (ref 12.9–16.5)
LYMPHOCYTES ABSOLUTE COUNT: 1.2 10*9/L (ref 1.1–3.6)
LYMPHOCYTES RELATIVE PERCENT: 22.1 %
MEAN CORPUSCULAR HEMOGLOBIN CONC: 33.7 g/dL (ref 32.0–36.0)
MEAN CORPUSCULAR HEMOGLOBIN: 29.1 pg (ref 25.9–32.4)
MEAN CORPUSCULAR VOLUME: 86.4 fL (ref 77.6–95.7)
MEAN PLATELET VOLUME: 7.8 fL (ref 6.8–10.7)
MONOCYTES ABSOLUTE COUNT: 0.6 10*9/L (ref 0.3–0.8)
MONOCYTES RELATIVE PERCENT: 10.6 %
NEUTROPHILS ABSOLUTE COUNT: 3.2 10*9/L (ref 1.8–7.8)
NEUTROPHILS RELATIVE PERCENT: 61.5 %
NUCLEATED RED BLOOD CELLS: 0 /100{WBCs} (ref <=4)
PLATELET COUNT: 288 10*9/L (ref 150–450)
RED BLOOD CELL COUNT: 4.5 10*12/L (ref 4.26–5.60)
RED CELL DISTRIBUTION WIDTH: 14.7 % (ref 12.2–15.2)
WBC ADJUSTED: 5.2 10*9/L (ref 3.6–11.2)

## 2020-11-30 LAB — TOBRAMYCIN LEVEL, PEAK: TOBRAMYCIN PEAK: 10 ug/mL (ref 4.0–10.0)

## 2020-11-30 MED ADMIN — albuterol nebulizer solution 2.5 mg: 2.5 mg | RESPIRATORY_TRACT | @ 13:00:00

## 2020-11-30 MED ADMIN — insulin lispro (HumaLOG) injection 0-12 Units: 0-12 [IU] | SUBCUTANEOUS | @ 01:00:00

## 2020-11-30 MED ADMIN — albuterol nebulizer solution 2.5 mg: 2.5 mg | RESPIRATORY_TRACT | @ 01:00:00

## 2020-11-30 MED ADMIN — elexacaftor-tezacaftor-ivacaft (TRIKAFTA) tablet 2 tablet **Patient Supplied**: 2 | ORAL | @ 14:00:00

## 2020-11-30 MED ADMIN — sodium chloride 7% nebulizer solution 4 mL: 4 mL | RESPIRATORY_TRACT | @ 01:00:00

## 2020-11-30 MED ADMIN — pramipexole (MIRAPEX) tablet 0.5 mg: .5 mg | ORAL | @ 23:00:00

## 2020-11-30 MED ADMIN — ondansetron (ZOFRAN) injection 4 mg: 4 mg | INTRAVENOUS | @ 14:00:00

## 2020-11-30 MED ADMIN — lisinopriL (PRINIVIL,ZESTRIL) tablet 10 mg: 10 mg | ORAL | @ 14:00:00

## 2020-11-30 MED ADMIN — piperacillin-tazobactam (ZOSYN) IVPB (premix) 4.5 g: 4.5 g | INTRAVENOUS | @ 17:00:00 | Stop: 2020-12-09

## 2020-11-30 MED ADMIN — insulin glargine (LANTUS) injection 50 Units: 50 [IU] | SUBCUTANEOUS | @ 01:00:00

## 2020-11-30 MED ADMIN — lipase-protease-amylase (CREON) 36,000-114,000- 180,000 unit capsule 288,000 units of lipase **PATIENT-SUPPLIED**: 8 | ORAL | @ 14:00:00

## 2020-11-30 MED ADMIN — fluticasone propionate (FLONASE) 50 mcg/actuation nasal spray 1 spray: 1 | NASAL | @ 14:00:00

## 2020-11-30 MED ADMIN — piperacillin-tazobactam (ZOSYN) IVPB (premix) 4.5 g: 4.5 g | INTRAVENOUS | @ 05:00:00 | Stop: 2020-12-09

## 2020-11-30 MED ADMIN — sodium chloride 7% nebulizer solution 4 mL: 4 mL | RESPIRATORY_TRACT | @ 13:00:00

## 2020-11-30 MED ADMIN — acetaminophen (TYLENOL) tablet 650 mg: 650 mg | ORAL | @ 01:00:00

## 2020-11-30 MED ADMIN — oxyCODONE (ROXICODONE) immediate release tablet 5 mg: 5 mg | ORAL | @ 01:00:00 | Stop: 2020-12-08

## 2020-11-30 MED ADMIN — oxyCODONE (ROXICODONE) immediate release tablet 5 mg: 5 mg | ORAL | @ 17:00:00 | Stop: 2020-12-08

## 2020-11-30 MED ADMIN — amitriptyline (ELAVIL) tablet 100 mg: 100 mg | ORAL | @ 01:00:00

## 2020-11-30 MED ADMIN — tobramycin (NEBCIN) 260 mg in sodium chloride (NS) 0.9 % 100 mL IVPB: 260 mg | INTRAVENOUS | @ 01:00:00 | Stop: 2020-12-07

## 2020-11-30 MED ADMIN — sodium chloride 7% nebulizer solution 4 mL: 4 mL | RESPIRATORY_TRACT | @ 18:00:00

## 2020-11-30 MED ADMIN — insulin lispro (HumaLOG) injection 0-12 Units: 0-12 [IU] | SUBCUTANEOUS | @ 17:00:00

## 2020-11-30 MED ADMIN — phytonadione (vitamin K1) (MEPHYTON) tablet 5 mg: 5 mg | ORAL | @ 14:00:00 | Stop: 2020-11-30

## 2020-11-30 MED ADMIN — lipase-protease-amylase (CREON) 36,000-114,000- 180,000 unit capsule 288,000 units of lipase **PATIENT-SUPPLIED**: 8 | ORAL | @ 23:00:00

## 2020-11-30 MED ADMIN — cetirizine (ZyrTEC) tablet 10 mg: 10 mg | ORAL | @ 14:00:00

## 2020-11-30 MED ADMIN — gabapentin (NEURONTIN) capsule 600 mg: 600 mg | ORAL | @ 14:00:00

## 2020-11-30 MED ADMIN — lipase-protease-amylase (CREON) 36,000-114,000- 180,000 unit capsule 288,000 units of lipase **PATIENT-SUPPLIED**: 8 | ORAL | @ 17:00:00

## 2020-11-30 MED ADMIN — atorvastatin (LIPITOR) tablet 20 mg: 20 mg | ORAL | @ 01:00:00

## 2020-11-30 MED ADMIN — albuterol nebulizer solution 2.5 mg: 2.5 mg | RESPIRATORY_TRACT | @ 18:00:00

## 2020-11-30 MED ADMIN — tobramycin (NEBCIN) 260 mg in sodium chloride (NS) 0.9 % 100 mL IVPB: 260 mg | INTRAVENOUS | @ 14:00:00 | Stop: 2020-11-30

## 2020-11-30 MED ADMIN — famotidine (PEPCID) tablet 20 mg: 20 mg | ORAL | @ 14:00:00

## 2020-11-30 MED ADMIN — pantoprazole (PROTONIX) EC tablet 20 mg: 20 mg | ORAL | @ 14:00:00

## 2020-11-30 MED ADMIN — rivaroxaban (XARELTO) tablet 20 mg: 20 mg | ORAL | @ 23:00:00

## 2020-11-30 MED ADMIN — melatonin tablet 10.5 mg: 10 mg | ORAL | @ 01:00:00

## 2020-11-30 MED ADMIN — FLUoxetine (PROzac) capsule 60 mg: 60 mg | ORAL | @ 14:00:00

## 2020-11-30 MED ADMIN — fluticasone furoate-vilanteroL (BREO ELLIPTA) 200-25 mcg/dose inhaler 1 puff: 1 | RESPIRATORY_TRACT | @ 13:00:00

## 2020-11-30 MED ADMIN — inhalational spacing device Spcr: RESPIRATORY_TRACT | @ 23:00:00 | Stop: 2020-11-30

## 2020-11-30 MED ADMIN — dornase alfa (PULMOZYME) 1 mg/mL solution 2.5 mg: 2.5 mg | RESPIRATORY_TRACT | @ 01:00:00

## 2020-11-30 MED ADMIN — pediatric multivitamin-vit D3 1,500 unit-vit K 800 mcg (MVW COMPLETE FORMULATION) capsule: 1 | ORAL | @ 01:00:00

## 2020-11-30 MED ADMIN — polyethylene glycol (MIRALAX) packet 17 g: 17 g | ORAL | @ 14:00:00

## 2020-11-30 MED ADMIN — piperacillin-tazobactam (ZOSYN) IVPB (premix) 4.5 g: 4.5 g | INTRAVENOUS | @ 10:00:00 | Stop: 2020-12-09

## 2020-11-30 MED ADMIN — elexacaftor-tezacaftor-ivacaft (TRIKAFTA) tablet 1 tablet: 1 | ORAL | @ 01:00:00

## 2020-11-30 MED ADMIN — lamoTRIgine (LaMICtal) tablet 200 mg: 200 mg | ORAL | @ 14:00:00

## 2020-11-30 MED ADMIN — insulin lispro (HumaLOG) injection 0-12 Units: 0-12 [IU] | SUBCUTANEOUS | @ 23:00:00

## 2020-11-30 MED ADMIN — piperacillin-tazobactam (ZOSYN) IVPB (premix) 4.5 g: 4.5 g | INTRAVENOUS | @ 23:00:00 | Stop: 2020-12-09

## 2020-11-30 MED ADMIN — ondansetron (ZOFRAN) injection 4 mg: 4 mg | INTRAVENOUS | @ 01:00:00

## 2020-11-30 MED ADMIN — dornase alfa (PULMOZYME) 1 mg/mL solution 2.5 mg: 2.5 mg | RESPIRATORY_TRACT | @ 14:00:00

## 2020-11-30 MED ADMIN — traZODone (DESYREL) tablet 100 mg: 100 mg | ORAL | @ 01:00:00

## 2020-11-30 MED ADMIN — pediatric multivitamin-vit D3 1,500 unit-vit K 800 mcg (MVW COMPLETE FORMULATION) capsule: 1 | ORAL | @ 14:00:00

## 2020-11-30 NOTE — Unmapped (Signed)
Patient alert and oriented x4. Patient with pain this Am and medicated with oxycodone with relief per [facility]; spoke with MD regarding pain scale; order modified. Patient also had some nausea this afternoon; zofran IV administered per [facility] with relief. Patient out of bed in chair throughout the day. Tolerating PO intake. PORT WNL. Call bell within reach and notified to call for assistance.     Problem: Infection (Cystic Fibrosis)  Goal: Absence of Infection Signs and Symptoms  Outcome: Ongoing - Unchanged  Intervention: Manage Infection and Prevent Transmission  Recent Flowsheet Documentation  Taken 11/29/2020 1800 by Nadine Counts, RN  Isolation Precautions: contact precautions maintained  Taken 11/29/2020 1600 by Nadine Counts, RN  Isolation Precautions: contact precautions maintained  Taken 11/29/2020 1400 by Nadine Counts, RN  Isolation Precautions: contact precautions maintained  Taken 11/29/2020 1200 by Nadine Counts, RN  Isolation Precautions: contact precautions maintained  Taken 11/29/2020 1000 by Nadine Counts, RN  Isolation Precautions: contact precautions maintained     Problem: Diabetes Comorbidity  Goal: Blood Glucose Level Within Targeted Range  Outcome: Ongoing - Unchanged

## 2020-11-30 NOTE — Unmapped (Signed)
PULMONARY CONSULT  NOTE      Patient: Christian Young(30-Oct-1985)  Reason for consultation: Christian Young is a 35 y.o. male who is seen in consultation at the request of Lillia Carmel, MD for comprehensive evaluation of CF exacerbation.        Assessment and Recommendations:      Principal Problem:    Cystic fibrosis with pulmonary exacerbation (CMS-HCC)  Active Problems:    Essential hypertension    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)    Pancreatic insufficiency due to cystic fibrosis (CMS-HCC)    Chronic deep vein thrombosis (DVT) of lower extremity (CMS-HCC)  Resolved Problems:    * No resolved hospital problems. *    Christian Young is a 35 y.o. male with PMHx of CF who presented to [facility] with Cystic fibrosis with pulmonary exacerbation (CMS-HCC).  ??  *Acute exacerbation of CF bronchiectasis: Pt recommended to pursue admission by his primary CF doc.  He has grown MSSA and 2 variants of PSAS (one pan resistant and one with intermediate resistance)  - CF sputum/culture, still unable to produce specimen.  - Zosyn 4.5 mg q6h.  - Tobramycin IV, pharmacy to dose, Mg with BMP while on Tobramycin.  - Plan for at least 14 days of antibiotics.  Will likely need to be completed as an inpatient (has done both here/home, feels current supports make it difficult to complete treatment at home).  - Continue Trikafta (non-formulary), Breo (formulary alternative for Symbicort) and Pulmozyme  - Airway clearance with albuterol TID, 7% hypertonic saline, chest vest, and aerobika  - Patient will require PFTs while in hospital. Ordered for likely 4/21.  - INR elevated, getting vit K daily for 5 days, though suspecting influenced by Xarelto use. Will complete then resume twice weekly while on IV abx.   - Continue to ensure having at least one bowel movement/day; admission earlier this year locally for ?pSBO/ileus, possible DIOS, daily Miralax (patient can refuse if not needing).      We appreciate the opportunity to assist in the care of this patient.  Please page [phone] with any questions.    Cecelia Byars, MD    Subjective:      History of Present Illness:  Christian Young is a 35 y.o. male admitted for CF exacerbation.  Has had recent pleuritic chest pain and fever.  Recommended by primary CF doc to present for admission and IV Abx.    Interval history 11/30/20: Doing okay, still nausea and headache in AM but well managed with PRN Zofran, still not sure where coming from. Trying more AC with Aerobika plus vest.    Past medical, past surgical, family, and social histories and allergies reviewed from initial admission note and otherwise in Epic.     Review of Systems: A comprehensive review of systems was performed and was negative except as above in HPI.     Objective:      Physical Exam:  Vitals:    11/29/20 1900 11/29/20 2059 11/30/20 0400 11/30/20 1203   BP: 123/86  106/69 109/79   Pulse: 80  68 96   Resp: 16  16 18    Temp: 37.2 ??C (99 ??F)  36.6 ??C (97.9 ??F) 36.8 ??C (98.2 ??F)   TempSrc: Oral  Oral Oral   SpO2: 99% 97% 97% 94%   Weight:       Height:           General: sitting up in chair, no respiratory distress  HEENT: anicteric sclera, moist mucous membranes  CV: RRR, no murmurs, rubs, or gallops  Pulm: normal work of breathing, Good air movement. CTAB  Abd: soft, non-tender, slight distension, normal bowel sounds  Ext: warm and well perfused, no edema  Neuro: grossly intact  Psych: appropriate mood and affect    Malnutrition Assessment by RD:          Diagnostic Review:   All labs and images were personally reviewed.

## 2020-11-30 NOTE — Unmapped (Signed)
Hospitalist Daily Progress Note     LOS: 6 days       Assessment/Plan:  Principal Problem:    Cystic fibrosis with pulmonary exacerbation (CMS-HCC)  Active Problems:    Essential hypertension    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)    Pancreatic insufficiency due to cystic fibrosis (CMS-HCC)    Chronic deep vein thrombosis (DVT) of lower extremity (CMS-HCC)  Resolved Problems:    * No resolved hospital problems. *         Christian Young is a 35 y.o. male with PMHx of CF who presented to [facility] with Cystic fibrosis with pulmonary exacerbation (CMS-HCC).  MRSA screen negative    Acute exacerbation of CF bronchiectasis: Admitted for 14d IV abx. Hx MSSA and 2 variants of PSAS (one pan resistant and one with intermediate resistance). AFB smear negative, blood cultures negative at 4 days.  - Pulmonology consult following, input appreciated.   - Zosyn 4.5 mg q6h - Note: patient has had cefepime allergy and developed AIN from Zosyn.    Continue to monitor closely, CMP and CBC daily.  - Tobramycin??IV, pharmacy to dose, Mg with BMP while on Tobramycin.  - Continue Trikafta (non-formulary), Breo (formulary alternative for Symbicort) and Pulmozyme  - Airway clearance with albuterol TID, 7% hypertonic saline, chest vest, and aerobika  - PFTs 11/29/20: results not available  - INR elevated 1.45--> recheck after vit K daily for 5 days; likely r/t Xarelto in part. Will complete then resume twice weekly while on IV abx.     Diabetes mellitus related to cystic fibrosis: Blood sugars from 1 12-2 97, he does eat some single large meals.  - Lantus 50 units nightly, sliding scale  - high-calorie high-protein diet.   - Encourage minimizing and/or spreading out throughout the day concentrated carbs    Pancreatic insufficiency due to CF:  - Continue Creon 8 capsules TID with meals and 4 capsules prn snacks  - Vitamins ADEK, comments as above regarding vitamin K.    Hx possible pSBO/ileus vs DIOS in admission earlier this year.  - ensure adequate bowel regimen including daily miralax    History of DVT: continue Xarelto 20 mg daily.  No abnormal bleeding reported.    Disposition: Inpatient14 days of intravenous antibiotics    Please page the [facility]) pager at [phone] with questions.        Subjective:   Oxycodone for pleuritic CP, IV zofran for N  BMs every other day, satisfied w/ this  BG [id number]  No issues or complaints today, discussed w/ Pulm as well    Objective:     Vital signs in last 24 hours:  Temp:  [36.6 ??C (97.9 ??F)-37.2 ??C (99 ??F)] 36.6 ??C (97.9 ??F)  Heart Rate:  [68-80] 68  Resp:  [16-24] 16  BP: (106-123)/(69-86) 106/69  MAP (mmHg):  [81-98] 81  SpO2:  [97 %-99 %] 97 %    Labs reviewed and significant for:  4/20:  CBC, CMP unremarkable  BG [id number]    Physical Exam  General: Alert, conversant, no acute distress.  HEENT: anicteric sclera, moist mucous membranes  CV: RRR, no murmurs, rubs, or gallops  Pulm: Normal work of breathing, no adventitial sounds.  Abd: soft, non-tender, non-distended  Ext: No cyanosis, clubbing or edema.  Neuro: Speech fluent.  No acute focal motor deficits.  Psych: Friendly, interactive, answers all questions appropriately.  PORT in place L upper chest

## 2020-11-30 NOTE — Unmapped (Signed)
Pt remains on RA. All scheduled inhaled medications received.Pt using home chest vest for airway clearance. No apparent distress noted at this time.

## 2020-11-30 NOTE — Unmapped (Signed)
POC discussed with patient.  No falls noted during my shift thus far and I will continue to monitor patient. IV antibiotics given per orders.  Port in left chest flushed per protocol.    Problem: Adult Inpatient Plan of Care  Goal: Plan of Care Review  Outcome: Progressing  Goal: Patient-Specific Goal (Individualized)  Outcome: Progressing  Goal: Absence of Hospital-Acquired Illness or Injury  Outcome: Progressing  Intervention: Identify and Manage Fall Risk  Recent Flowsheet Documentation  Taken 11/29/2020 2200 by Wynn Maudlin, RN  Safety Interventions:   fall reduction program maintained   isolation precautions   low bed   nonskid shoes/slippers when out of bed  Taken 11/29/2020 2000 by Wynn Maudlin, RN  Safety Interventions:   fall reduction program maintained   isolation precautions   low bed   nonskid shoes/slippers when out of bed  Intervention: Prevent and Manage VTE (Venous Thromboembolism) Risk  Recent Flowsheet Documentation  Taken 11/29/2020 2000 by Wynn Maudlin, RN  Activity Management: activity adjusted per tolerance  Goal: Optimal Comfort and Wellbeing  Outcome: Progressing  Goal: Readiness for Transition of Care  Outcome: Progressing  Goal: Rounds/Family Conference  Outcome: Progressing     Problem: Infection  Goal: Absence of Infection Signs and Symptoms  Outcome: Progressing  Intervention: Prevent or Manage Infection  Recent Flowsheet Documentation  Taken 11/29/2020 2200 by Wynn Maudlin, RN  Isolation Precautions: contact precautions maintained  Taken 11/29/2020 2000 by Wynn Maudlin, RN  Isolation Precautions: contact precautions maintained     Problem: Adjustment to Illness (Cystic Fibrosis)  Goal: Optimal Coping  Outcome: Progressing     Problem: Infection (Cystic Fibrosis)  Goal: Absence of Infection Signs and Symptoms  Outcome: Progressing  Intervention: Manage Infection and Prevent Transmission  Recent Flowsheet Documentation  Taken 11/29/2020 2200 by Wynn Maudlin, RN  Isolation Precautions: contact precautions maintained  Taken 11/29/2020 2000 by Wynn Maudlin, RN  Isolation Precautions: contact precautions maintained     Problem: Oral Intake Inadequate (Cystic Fibrosis)  Goal: Optimal Nutrition Intake  Outcome: Progressing     Problem: Malabsorption (Cystic Fibrosis)  Goal: Optimal Bowel Elimination  Outcome: Progressing     Problem: Respiratory Compromise (Cystic Fibrosis)  Goal: Effective Oxygenation and Ventilation  Outcome: Progressing  Intervention: Optimize Oxygenation and Ventilation  Recent Flowsheet Documentation  Taken 11/29/2020 2000 by Wynn Maudlin, RN  Head of Bed [facility]) Positioning: [facility] elevated     Problem: Airway Clearance Ineffective  Goal: Effective Airway Clearance  Outcome: Progressing  Intervention: Promote Airway Secretion Clearance  Recent Flowsheet Documentation  Taken 11/29/2020 2000 by Wynn Maudlin, RN  Activity Management: activity adjusted per tolerance     Problem: Diabetes Comorbidity  Goal: Blood Glucose Level Within Targeted Range  Outcome: Progressing     Problem: Hypertension Comorbidity  Goal: Blood Pressure in Desired Range  Outcome: Progressing

## 2020-12-01 LAB — COMPREHENSIVE METABOLIC PANEL
ALBUMIN: 3.1 g/dL — ABNORMAL LOW (ref 3.4–5.0)
ALKALINE PHOSPHATASE: 47 U/L (ref 46–116)
ALT (SGPT): 25 U/L (ref 10–49)
ANION GAP: 5 mmol/L (ref 5–14)
AST (SGOT): 17 U/L (ref <=34)
BILIRUBIN TOTAL: 0.3 mg/dL (ref 0.3–1.2)
BLOOD UREA NITROGEN: 14 mg/dL (ref 9–23)
BUN / CREAT RATIO: 23
CALCIUM: 7.7 mg/dL — ABNORMAL LOW (ref 8.7–10.4)
CHLORIDE: 108 mmol/L — ABNORMAL HIGH (ref 98–107)
CO2: 26.9 mmol/L (ref 20.0–31.0)
CREATININE: 0.61 mg/dL
EGFR CKD-EPI AA MALE: >90 mL/min/{1.73_m2} (ref >=60)
EGFR CKD-EPI NON-AA MALE: >90 mL/min/{1.73_m2} (ref >=60)
GLUCOSE RANDOM: 188 mg/dL — ABNORMAL HIGH (ref 70–179)
POTASSIUM: 3.5 mmol/L (ref 3.4–4.8)
PROTEIN TOTAL: 5.6 g/dL — ABNORMAL LOW (ref 5.7–8.2)
SODIUM: 140 mmol/L (ref 135–145)

## 2020-12-01 LAB — CBC W/ AUTO DIFF
BASOPHILS ABSOLUTE COUNT: 0.1 10*9/L (ref 0.0–0.1)
BASOPHILS RELATIVE PERCENT: 1.2 %
EOSINOPHILS ABSOLUTE COUNT: 0.3 10*9/L (ref 0.0–0.5)
EOSINOPHILS RELATIVE PERCENT: 4.7 %
HEMATOCRIT: 35.9 % — ABNORMAL LOW (ref 39.0–48.0)
HEMOGLOBIN: 12.2 g/dL — ABNORMAL LOW (ref 12.9–16.5)
LYMPHOCYTES ABSOLUTE COUNT: 1.1 10*9/L (ref 1.1–3.6)
LYMPHOCYTES RELATIVE PERCENT: 18.4 %
MEAN CORPUSCULAR HEMOGLOBIN CONC: 33.9 g/dL (ref 32.0–36.0)
MEAN CORPUSCULAR HEMOGLOBIN: 29 pg (ref 25.9–32.4)
MEAN CORPUSCULAR VOLUME: 85.5 fL (ref 77.6–95.7)
MEAN PLATELET VOLUME: 7.9 fL (ref 6.8–10.7)
MONOCYTES ABSOLUTE COUNT: 0.6 10*9/L (ref 0.3–0.8)
MONOCYTES RELATIVE PERCENT: 9.7 %
NEUTROPHILS ABSOLUTE COUNT: 3.9 10*9/L (ref 1.8–7.8)
NEUTROPHILS RELATIVE PERCENT: 66 %
NUCLEATED RED BLOOD CELLS: 0 /100{WBCs} (ref <=4)
PLATELET COUNT: 272 10*9/L (ref 150–450)
RED BLOOD CELL COUNT: 4.2 10*12/L — ABNORMAL LOW (ref 4.26–5.60)
RED CELL DISTRIBUTION WIDTH: 14.4 % (ref 12.2–15.2)
WBC ADJUSTED: 5.9 10*9/L (ref 3.6–11.2)

## 2020-12-01 LAB — PROTIME-INR
INR: 1.82
PROTIME: 21.2 s — ABNORMAL HIGH (ref 10.3–13.4)

## 2020-12-01 LAB — MAGNESIUM: MAGNESIUM: 1.6 mg/dL (ref 1.6–2.6)

## 2020-12-01 MED ADMIN — sodium chloride 7% nebulizer solution 4 mL: 4 mL | RESPIRATORY_TRACT | @ 01:00:00

## 2020-12-01 MED ADMIN — fluticasone propionate (FLONASE) 50 mcg/actuation nasal spray 1 spray: 1 | NASAL | @ 13:00:00

## 2020-12-01 MED ADMIN — magnesium sulfate 2gm/50mL IVPB: 2 g | INTRAVENOUS | @ 16:00:00 | Stop: 2020-12-01

## 2020-12-01 MED ADMIN — prochlorperazine (COMPAZINE) injection 5 mg: 5 mg | INTRAVENOUS | @ 21:00:00

## 2020-12-01 MED ADMIN — lisinopriL (PRINIVIL,ZESTRIL) tablet 10 mg: 10 mg | ORAL | @ 13:00:00

## 2020-12-01 MED ADMIN — ondansetron (ZOFRAN) injection 4 mg: 4 mg | INTRAVENOUS | @ 01:00:00

## 2020-12-01 MED ADMIN — potassium chloride (KLOR-CON) CR tablet 40 mEq: 40 meq | ORAL | @ 13:00:00 | Stop: 2020-12-01

## 2020-12-01 MED ADMIN — traZODone (DESYREL) tablet 100 mg: 100 mg | ORAL | @ 01:00:00

## 2020-12-01 MED ADMIN — piperacillin-tazobactam (ZOSYN) IVPB (premix) 4.5 g: 4.5 g | INTRAVENOUS | @ 07:00:00 | Stop: 2020-12-09

## 2020-12-01 MED ADMIN — tobramycin (NEBCIN) 280 mg in sodium chloride (NS) 0.9 % 100 mL IVPB: 280 mg | INTRAVENOUS | @ 02:00:00 | Stop: 2020-12-07

## 2020-12-01 MED ADMIN — polyethylene glycol (MIRALAX) packet 17 g: 17 g | ORAL | @ 13:00:00

## 2020-12-01 MED ADMIN — melatonin tablet 10.5 mg: 10 mg | ORAL | @ 01:00:00

## 2020-12-01 MED ADMIN — tobramycin (NEBCIN) 280 mg in sodium chloride (NS) 0.9 % 100 mL IVPB: 280 mg | INTRAVENOUS | @ 15:00:00 | Stop: 2020-12-07

## 2020-12-01 MED ADMIN — pantoprazole (PROTONIX) EC tablet 20 mg: 20 mg | ORAL | @ 13:00:00

## 2020-12-01 MED ADMIN — atorvastatin (LIPITOR) tablet 20 mg: 20 mg | ORAL | @ 01:00:00

## 2020-12-01 MED ADMIN — fluticasone furoate-vilanteroL (BREO ELLIPTA) 200-25 mcg/dose inhaler 1 puff: 1 | RESPIRATORY_TRACT | @ 13:00:00

## 2020-12-01 MED ADMIN — albuterol nebulizer solution 2.5 mg: 2.5 mg | RESPIRATORY_TRACT | @ 01:00:00

## 2020-12-01 MED ADMIN — pancrelipase (Lip-Prot-Amyl) (CREON) 24,000-76,000 -120,000 unit delayed release capsule 96,000 units of lipase: 4 | ORAL | @ 21:00:00

## 2020-12-01 MED ADMIN — insulin lispro (HumaLOG) injection 0-12 Units: 0-12 [IU] | SUBCUTANEOUS | @ 03:00:00

## 2020-12-01 MED ADMIN — dornase alfa (PULMOZYME) 1 mg/mL solution 2.5 mg: 2.5 mg | RESPIRATORY_TRACT | @ 01:00:00

## 2020-12-01 MED ADMIN — magnesium oxide (MAG-OX) tablet 400 mg: 400 mg | ORAL | @ 13:00:00

## 2020-12-01 MED ADMIN — FLUoxetine (PROzac) capsule 60 mg: 60 mg | ORAL | @ 13:00:00

## 2020-12-01 MED ADMIN — lamoTRIgine (LaMICtal) tablet 200 mg: 200 mg | ORAL | @ 13:00:00

## 2020-12-01 MED ADMIN — sodium chloride 7% nebulizer solution 4 mL: 4 mL | RESPIRATORY_TRACT | @ 19:00:00

## 2020-12-01 MED ADMIN — piperacillin-tazobactam (ZOSYN) IVPB (premix) 4.5 g: 4.5 g | INTRAVENOUS | @ 13:00:00 | Stop: 2020-12-09

## 2020-12-01 MED ADMIN — insulin glargine (LANTUS) injection 50 Units: 50 [IU] | SUBCUTANEOUS | @ 01:00:00

## 2020-12-01 MED ADMIN — cetirizine (ZyrTEC) tablet 10 mg: 10 mg | ORAL | @ 13:00:00

## 2020-12-01 MED ADMIN — piperacillin-tazobactam (ZOSYN) IVPB (premix) 4.5 g: 4.5 g | INTRAVENOUS | @ 19:00:00 | Stop: 2020-12-09

## 2020-12-01 MED ADMIN — rivaroxaban (XARELTO) tablet 20 mg: 20 mg | ORAL | @ 23:00:00

## 2020-12-01 MED ADMIN — lipase-protease-amylase (CREON) 36,000-114,000- 180,000 unit capsule 288,000 units of lipase **PATIENT-SUPPLIED**: 8 | ORAL | @ 13:00:00 | Stop: 2020-12-01

## 2020-12-01 MED ADMIN — albuterol nebulizer solution 2.5 mg: 2.5 mg | RESPIRATORY_TRACT | @ 13:00:00

## 2020-12-01 MED ADMIN — dornase alfa (PULMOZYME) 1 mg/mL solution 2.5 mg: 2.5 mg | RESPIRATORY_TRACT | @ 13:00:00

## 2020-12-01 MED ADMIN — insulin lispro (HumaLOG) injection 0-12 Units: 0-12 [IU] | SUBCUTANEOUS | @ 13:00:00

## 2020-12-01 MED ADMIN — gabapentin (NEURONTIN) capsule 600 mg: 600 mg | ORAL | @ 13:00:00

## 2020-12-01 MED ADMIN — amitriptyline (ELAVIL) tablet 100 mg: 100 mg | ORAL | @ 01:00:00

## 2020-12-01 MED ADMIN — pancrelipase (Lip-Prot-Amyl) (CREON) 24,000-76,000 -120,000 unit delayed release capsule 192,000 units of lipase: 8 | ORAL | @ 18:00:00

## 2020-12-01 MED ADMIN — famotidine (PEPCID) tablet 20 mg: 20 mg | ORAL | @ 13:00:00

## 2020-12-01 MED ADMIN — ondansetron (ZOFRAN) injection 4 mg: 4 mg | INTRAVENOUS | @ 13:00:00

## 2020-12-01 MED ADMIN — oxyCODONE (ROXICODONE) immediate release tablet 5 mg: 5 mg | ORAL | @ 01:00:00 | Stop: 2020-12-08

## 2020-12-01 MED ADMIN — sodium chloride 7% nebulizer solution 4 mL: 4 mL | RESPIRATORY_TRACT | @ 13:00:00

## 2020-12-01 MED ADMIN — pramipexole (MIRAPEX) tablet 0.5 mg: .5 mg | ORAL | @ 23:00:00

## 2020-12-01 MED ADMIN — elexacaftor-tezacaftor-ivacaft (TRIKAFTA) tablet 1 tablet: 1 | ORAL | @ 01:00:00

## 2020-12-01 MED ADMIN — albuterol nebulizer solution 2.5 mg: 2.5 mg | RESPIRATORY_TRACT | @ 19:00:00

## 2020-12-01 MED ADMIN — pediatric multivitamin-vit D3 1,500 unit-vit K 800 mcg (MVW COMPLETE FORMULATION) capsule: 1 | ORAL | @ 01:00:00

## 2020-12-01 MED ADMIN — elexacaftor-tezacaftor-ivacaft (TRIKAFTA) tablet 2 tablet **Patient Supplied**: 2 | ORAL | @ 13:00:00

## 2020-12-01 MED ADMIN — pediatric multivitamin-vit D3 1,500 unit-vit K 800 mcg (MVW COMPLETE FORMULATION) capsule: 1 | ORAL | @ 13:00:00

## 2020-12-01 NOTE — Unmapped (Signed)
[facility] Refill Coordination Note    Specialty Medication(s) to be Shipped:   CF/Pulmonary: -Trikafta     Other medication(s) to be shipped: No additional medications requested for fill at this time     Christian Young, DOB: [birth year]  Phone: [phone] (home)       All above HIPAA information was verified with patient.     Was a Nurse, learning disability used for this call? No    Completed refill call assessment today to schedule patient's medication shipment from the [facility] [phone]).  All relevant notes have been reviewed.     Specialty medication(s) and dose(s) confirmed: Regimen is correct and unchanged.   Changes to medications: Casmer reports no changes at this time.  Changes to insurance: No  New side effects reported not previously addressed with a pharmacist or physician: None reported  Questions for the pharmacist: No    Confirmed patient received a Conservation officer, historic buildings and a Surveyor, mining with first shipment. The patient will receive a drug information handout for each medication shipped and additional FDA Medication Guides as required.       DISEASE/MEDICATION-SPECIFIC INFORMATION        For CF patients: CF [facility] Active? Yes  **HW[facility] VST til 05/06/21**    SPECIALTY MEDICATION ADHERENCE     Medication Adherence    Patient reported X missed doses in the last month: 0  Specialty Medication: Trikafta  Patient is on additional specialty medications: No  Patient is on more than two specialty medications: No  Informant: patient  Reliability of informant: reliable  Reasons for non-adherence: no problems identified  Support network for adherence: family member      Trikafta  7 days worth of medication on hand.          Were doses missed due to medication being on hold? No        REFERRAL TO PHARMACIST     Referral to the pharmacist: Not needed      [facility]     Shipping address confirmed in Epic.     Delivery Scheduled: Yes, Expected medication delivery date: 12/06/20.     Medication will be delivered via UPS to the prescription address in Epic WAM.    Christian Young   [facility] Specialty Technician

## 2020-12-01 NOTE — Unmapped (Signed)
Patient Aox4. Patient with pain this afternoon and oxycodone per [facility] administered and nausea this AM with zofran administered as well. No other complaints. PORT WNL. Receiving IV antibiotics. Call bell within reach and notified to call for assistance.     Problem: Diabetes Comorbidity  Goal: Blood Glucose Level Within Targeted Range  Outcome: Ongoing - Unchanged     Problem: Airway Clearance Ineffective  Goal: Effective Airway Clearance  Outcome: Progressing  Intervention: Promote Airway Secretion Clearance  Recent Flowsheet Documentation  Taken 11/30/2020 0940 by Nadine Counts, RN  Activity Management:   activity adjusted per tolerance   up ad lib

## 2020-12-01 NOTE — Unmapped (Signed)
Patient alert and oriented x 4.  Contact isolation maintained for MDRO.  Medications given as ordered.  Breathing treatments administered by RT.  Rest promoted overnight.    Problem: Adult Inpatient Plan of Care  Goal: Plan of Care Review  Outcome: Progressing  Goal: Patient-Specific Goal (Individualized)  Outcome: Progressing  Goal: Absence of Hospital-Acquired Illness or Injury  Outcome: Progressing  Intervention: Identify and Manage Fall Risk  Recent Flowsheet Documentation  Taken 11/30/2020 2000 by Hansel Starling, RN  Safety Interventions:   environmental modification   fall reduction program maintained   infection management   lighting adjusted for tasks/safety   isolation precautions   nonskid shoes/slippers when out of bed  Intervention: Prevent Skin Injury  Recent Flowsheet Documentation  Taken 11/30/2020 2000 by Hansel Starling, RN  Skin Protection:   adhesive use limited   cleansing with dimethicone incontinence wipes   incontinence pads utilized  Intervention: Prevent and Manage VTE (Venous Thromboembolism) Risk  Recent Flowsheet Documentation  Taken 11/30/2020 2000 by Hansel Starling, RN  Activity Management: activity adjusted per tolerance  Intervention: Prevent Infection  Recent Flowsheet Documentation  Taken 11/30/2020 2000 by Hansel Starling, RN  Infection Prevention:   cohorting utilized   environmental surveillance performed   equipment surfaces disinfected   hand hygiene promoted   rest/sleep promoted   personal protective equipment utilized   single patient room provided   visitors restricted/screened  Goal: Optimal Comfort and Wellbeing  Outcome: Progressing  Goal: Readiness for Transition of Care  Outcome: Progressing  Goal: Rounds/Family Conference  Outcome: Progressing     Problem: Infection  Goal: Absence of Infection Signs and Symptoms  Outcome: Progressing  Intervention: Prevent or Manage Infection  Recent Flowsheet Documentation  Taken 11/30/2020 2000 by Hansel Starling, RN  Infection Management: aseptic technique maintained  Isolation Precautions: contact precautions maintained     Problem: Adjustment to Illness (Cystic Fibrosis)  Goal: Optimal Coping  Outcome: Progressing     Problem: Infection (Cystic Fibrosis)  Goal: Absence of Infection Signs and Symptoms  Outcome: Progressing  Intervention: Manage Infection and Prevent Transmission  Recent Flowsheet Documentation  Taken 11/30/2020 2000 by Hansel Starling, RN  Infection Management: aseptic technique maintained  Isolation Precautions: contact precautions maintained     Problem: Oral Intake Inadequate (Cystic Fibrosis)  Goal: Optimal Nutrition Intake  Outcome: Progressing     Problem: Malabsorption (Cystic Fibrosis)  Goal: Optimal Bowel Elimination  Outcome: Progressing     Problem: Respiratory Compromise (Cystic Fibrosis)  Goal: Effective Oxygenation and Ventilation  Outcome: Progressing     Problem: Airway Clearance Ineffective  Goal: Effective Airway Clearance  Outcome: Progressing  Intervention: Promote Airway Secretion Clearance  Recent Flowsheet Documentation  Taken 11/30/2020 2000 by Hansel Starling, RN  Activity Management: activity adjusted per tolerance     Problem: Diabetes Comorbidity  Goal: Blood Glucose Level Within Targeted Range  Outcome: Progressing     Problem: Hypertension Comorbidity  Goal: Blood Pressure in Desired Range  Outcome: Progressing

## 2020-12-01 NOTE — Unmapped (Addendum)
Hospitalist Daily Progress Note     LOS: 7 days       Assessment/Plan:  Principal Problem:    Cystic fibrosis with pulmonary exacerbation (CMS-HCC)  Active Problems:    Essential hypertension    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)    Pancreatic insufficiency due to cystic fibrosis (CMS-HCC)    Chronic deep vein thrombosis (DVT) of lower extremity (CMS-HCC)  Resolved Problems:    * No resolved hospital problems. *       Christian Young is a 35 y.o. male with PMHx of CF who presented to [facility] with Cystic fibrosis with pulmonary exacerbation (CMS-HCC).  MRSA screen negative    Acute exacerbation of CF bronchiectasis: Admitted for 14d IV abx. Hx MSSA and 2 variants of PSAS (one pan resistant and one with intermediate resistance). AFB smear negative, blood cultures negative. Pt unable to produce specimen for new culture.   - Pulmonology consult following, input appreciated.   - Zosyn 4.5 mg q6h - Note: patient has had cefepime allergy and developed AIN from Zosyn.    Continue to monitor closely, CMP and CBC daily.  - Tobramycin??IV, pharmacy to dose, Mg with BMP while on Tobramycin.  - Continue Trikafta (non-form), Breo (for Symbicort) and Pulmozyme  - Airway clearance with albuterol TID, 7% hypertonic saline, chest vest, and aerobika  - PFTs ordered by Pulm for 12/01/20  - INR elevated 1.45--> 1.8 despite vitamin K daily for 5 days, likely r/t Xarelto. Resume vit K dose twice weekly while on IV abx.     Diabetes mellitus related to cystic fibrosis: Blood sugars intermittently elevated.  - Lantus 50 units nightly, sliding scale  - high-calorie high-protein diet.     Pancreatic insufficiency due to CF: Creon 8/meals, 4/snacks. ADEK vitamins  Hx possible pSBO/ileus vs DIOS. Daily miralax. BM every other day goal per pt  Hx DVT: continue Xarelto 20. Provide vitamin K twice weekly while on IV abx.   Mild hypokalemia, hypomagnesium: Replete PO/IV and monitor daily    PPX: rivaroxaban, ppi + h2blk  Disposition: Inpatient 14 days of intravenous antibiotics (through 4/28)    Please page the [facility]) pager at [phone] with questions.      Subjective:   Yesterday: oxycodone x2 for pleuritic CP, IV zofran x2 for N  BG 85-276, SSI 6+2+2  BMs apparently not being recorded by RN, but pt reports last BM yesterday  No issues or complaints today, discussed w/ Pulm as well    Objective:     Vital signs in last 24 hours:  Temp:  [36.6 ??C (97.9 ??F)-37.1 ??C (98.8 ??F)] 36.6 ??C (97.9 ??F)  Heart Rate:  [71-96] 71  Resp:  [18] 18  BP: (109-115)/(61-79) 111/61  MAP (mmHg):  [74-89] 74  SpO2:  [94 %-98 %] 98 %    Labs reviewed and significant for:  4/21  CBC, CMP unremarkable  BG 85-276  K 3.5, Mg 1.6 - repleting    Physical Exam  General: Still in bed, Alert, conversant, no acute distress.  ENT: MMM  CV: RRR, no murmurs  Pulm: CTAB, normal WOB  Abd: NABS, soft, non-tender, non-distended  Ext: No edema.  Neuro: Speech fluent, cognition intact  Psych: normal mood and affect  PORT in place L upper chest

## 2020-12-01 NOTE — Unmapped (Signed)
Diabetes education consultation: Consulted for assistance with providing instruction on diabetes self-management skills. Phone call with Christian Young on his hospital room phone at the  bedside. Provided 30  Minutes teaching diabetes education.     Assessment: Christian Young??is a 35 y.o.??male??with PMHx of CF who??presented to [facility] with??Cystic fibrosis with pulmonary exacerbation .  Christian Young has CFRD see MAR for medication list.   Reason for Consultation: Carb Counting/ Assess education need    A1C:    Lab Results   Component Value Date    A1C 10.7 (H) 11/29/2020       Being Active:  Discussed the impact of physical activity on blood sugars, encouraged to engage in physical activity after meals, examples provided.     Healthy Eating:  Discussed with patient meal planning principles for glucose control including the plate method, which foods are carbs and portion control. Reviewed avoiding sugary beverages/concentrated sweet. Reviewed dietary guidelines/ plate method/ avoiding concentrated sweets/eating at consistent times. Sent additional resources to patient's personal email address on:  Meal Planning Basics  Foods that Affect and Do Not Affect Blood Glucose  Timing, Amount, and Balance in Meals  Food Labels  Carbohydrate Counting   Low Carbohydrate Snacks            Recommendations: At f/u visit with PCP request referral for DSMES (Diabetes Self Management Education and Support) after discharge.     Plan:WIll follow while admitted  Thank You,   Abram Sander, BSN, RN, CDCES, Diabetes Nurse Educator - pager: [phone]

## 2020-12-01 NOTE — Unmapped (Signed)
Aminoglycoside Therapeutic Monitoring Pharmacy Note    Christian Young is a 35 y.o. male continuing tobramycin. Date of therapy initiation: 11/24/20    Indication: CF exacerbation    Prior Dosing Information: Current regimen 260 mg every 12 hours      Goals:  Therapeutic Drug Levels  ?? Trough level: tobramycin <1 mg/L  ?? Peak level: tobramycin 10-14 mg/L    Additional Clinical Monitoring/Outcomes  Renal function, volume status (intake and output)    Results:   ?? Trough level: 0.9 mg/L, drawn ~3.16 hours until next infusion (Extrapolated trough: 0.4 mg/L)   ?? Peak level: 10 mg/L, drawn ~ 40 minutes after infusion ended (Extrapolated Peak: 9 mg/L)    Wt Readings from Last 1 Encounters:   11/24/20 (!) 101.3 kg (223 lb 4.8 oz)     Lab Results   Component Value Date    CREATININE 0.78 11/30/2020       Pharmacokinetic Considerations and Significant Drug Interactions:  ??? Adult (calculated on 4/20): Vd = 28.2 L, ke = 0.276 hr-1  ??? Concurrent nephrotoxic meds: Not applicable    Assessment/Plan:  Given peak level subtherapeutic, although level was collected early, appropriate to increase dose     Recommendation(s)  ??? Change current regimen to 280 mg every 12 hours  ??? Estimated peak and trough on recommended regimen: peak = 9.6 mg/L, trough = 0.4 mg/L    Follow-up  ??? Level due: peak and trough levels around the fourth or fifth dose  ??? A pharmacist will continue to monitor and order levels as appropriate    Please page service pharmacist with questions/clarifications.    Winferd Humphrey, PharmD

## 2020-12-01 NOTE — Unmapped (Signed)
PULMONARY CONSULT  NOTE      Patient: Christian Young(03-30-1986)  Reason for consultation: Christian Young is a 35 y.o. male who is seen in consultation at the request of Lillia Carmel, MD for comprehensive evaluation of CF exacerbation.        Assessment and Recommendations:      Principal Problem:    Cystic fibrosis with pulmonary exacerbation (CMS-HCC)  Active Problems:    Essential hypertension    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)    Pancreatic insufficiency due to cystic fibrosis (CMS-HCC)    Chronic deep vein thrombosis (DVT) of lower extremity (CMS-HCC)  Resolved Problems:    * No resolved hospital problems. *    Christian Young is a 35 y.o. male with PMHx of CF who presented to [facility] with Cystic fibrosis with pulmonary exacerbation (CMS-HCC).  ??  Acute exacerbation of CF bronchiectasis: Pt recommended to pursue admission by his primary CF doc.  He has grown MSSA and 2 variants of PSAS (one pan resistant and one with intermediate resistance)  - CF sputum/culture, still unable to produce specimen.  - Zosyn 4.5 mg q6h.  - Tobramycin IV, pharmacy to dose, Mg with BMP while on Tobramycin.  - Plan for at least 14 days of antibiotics.  Will likely need to be completed as an inpatient (has done both here/home, feels current supports make it difficult to complete treatment at home).  - Continue Trikafta (non-formulary), Breo (formulary alternative for Symbicort) and Pulmozyme  - Airway clearance with albuterol TID, 7% hypertonic saline, chest vest, and aerobika  - Patient will require PFTs while in hospital, being done today  - INR elevated, s/p vit K daily for 5 days, though suspecting influenced by Xarelto use. Now twice weekly while on IV abx.     Nausea: Not typical symptom for patient while on IV antibiotics but now persistent here, improving with IV Zofran but effect not lasting. Intermittently interfering with some PO intake. Lab evaluation and exam unremarkable.  - Continue PRN Zofran.  - Can try lower dose IV Compazine to see if this is more helpful.  - Continue pancreatic enzymes with Creon.  - Continue to ensure having at least one bowel movement/day; admission earlier this year locally for ?pSBO/ileus, possible DIOS, daily Miralax (patient can refuse if not needing).      We appreciate the opportunity to assist in the care of this patient.  Please page [phone] with any questions.    Cecelia Byars, MD    Subjective:      History of Present Illness:  Christian Young is a 35 y.o. male admitted for CF exacerbation.  Has had recent pleuritic chest pain and fever.  Recommended by primary CF doc to present for admission and IV Abx.    Interval history 12/01/20: Overall unchanged. Still nauseous, PRN Zofran helps but effect not lasting to lunch. Mucus clearance and production increasing slightly, still not enough for culture.    Past medical, past surgical, family, and social histories and allergies reviewed from initial admission note and otherwise in Epic.     Review of Systems: A comprehensive review of systems was performed and was negative except as above in HPI.     Objective:      Physical Exam:  Vitals:    11/30/20 2011 12/01/20 0224 12/01/20 0856 12/01/20 1200   BP: 115/73 111/61 115/84 124/84   Pulse: 95 71  90   Resp: 18 18  18  Temp: 37.1 ??C (98.8 ??F) 36.6 ??C (97.9 ??F)  36.7 ??C (98.1 ??F)   TempSrc: Oral Oral  Oral   SpO2: 98% 98%  96%   Weight:       Height:           General: sitting up in chair, no respiratory distress  HEENT: anicteric sclera, moist mucous membranes  CV: RRR, no murmurs, rubs, or gallops  Pulm: normal work of breathing, Good air movement. CTAB  Abd: soft, non-tender, slight distension, normal bowel sounds  Ext: warm and well perfused, no edema  Neuro: grossly intact  Psych: appropriate mood and affect    Malnutrition Assessment by RD:          Diagnostic Review:   All labs and images were personally reviewed.

## 2020-12-02 LAB — MAGNESIUM: MAGNESIUM: 2 mg/dL (ref 1.6–2.6)

## 2020-12-02 LAB — COMPREHENSIVE METABOLIC PANEL
ALBUMIN: 3.7 g/dL (ref 3.4–5.0)
ALKALINE PHOSPHATASE: 59 U/L (ref 46–116)
ALT (SGPT): 30 U/L (ref 10–49)
ANION GAP: 5 mmol/L (ref 5–14)
AST (SGOT): 18 U/L (ref <=34)
BILIRUBIN TOTAL: 0.4 mg/dL (ref 0.3–1.2)
BLOOD UREA NITROGEN: 20 mg/dL (ref 9–23)
BUN / CREAT RATIO: 26
CALCIUM: 9 mg/dL (ref 8.7–10.4)
CHLORIDE: 102 mmol/L (ref 98–107)
CO2: 30.7 mmol/L (ref 20.0–31.0)
CREATININE: 0.76 mg/dL
EGFR CKD-EPI AA MALE: >90 mL/min/{1.73_m2} (ref >=60)
EGFR CKD-EPI NON-AA MALE: >90 mL/min/{1.73_m2} (ref >=60)
GLUCOSE RANDOM: 135 mg/dL (ref 70–179)
POTASSIUM: 4.1 mmol/L (ref 3.4–4.8)
PROTEIN TOTAL: 6.8 g/dL (ref 5.7–8.2)
SODIUM: 138 mmol/L (ref 135–145)

## 2020-12-02 LAB — CBC W/ AUTO DIFF
BASOPHILS ABSOLUTE COUNT: 0.1 10*9/L (ref 0.0–0.1)
BASOPHILS RELATIVE PERCENT: 1.3 %
EOSINOPHILS ABSOLUTE COUNT: 0.3 10*9/L (ref 0.0–0.5)
EOSINOPHILS RELATIVE PERCENT: 5.2 %
HEMATOCRIT: 38 % — ABNORMAL LOW (ref 39.0–48.0)
HEMOGLOBIN: 12.8 g/dL — ABNORMAL LOW (ref 12.9–16.5)
LYMPHOCYTES ABSOLUTE COUNT: 1.1 10*9/L (ref 1.1–3.6)
LYMPHOCYTES RELATIVE PERCENT: 20.6 %
MEAN CORPUSCULAR HEMOGLOBIN CONC: 33.7 g/dL (ref 32.0–36.0)
MEAN CORPUSCULAR HEMOGLOBIN: 29.1 pg (ref 25.9–32.4)
MEAN CORPUSCULAR VOLUME: 86.6 fL (ref 77.6–95.7)
MEAN PLATELET VOLUME: 7.7 fL (ref 6.8–10.7)
MONOCYTES ABSOLUTE COUNT: 0.6 10*9/L (ref 0.3–0.8)
MONOCYTES RELATIVE PERCENT: 10.5 %
NEUTROPHILS ABSOLUTE COUNT: 3.4 10*9/L (ref 1.8–7.8)
NEUTROPHILS RELATIVE PERCENT: 62.4 %
NUCLEATED RED BLOOD CELLS: 0 /100{WBCs} (ref <=4)
PLATELET COUNT: 289 10*9/L (ref 150–450)
RED BLOOD CELL COUNT: 4.39 10*12/L (ref 4.26–5.60)
RED CELL DISTRIBUTION WIDTH: 14.5 % (ref 12.2–15.2)
WBC ADJUSTED: 5.5 10*9/L (ref 3.6–11.2)

## 2020-12-02 MED ADMIN — insulin glargine (LANTUS) injection 50 Units: 50 [IU] | SUBCUTANEOUS | @ 02:00:00

## 2020-12-02 MED ADMIN — lisinopriL (PRINIVIL,ZESTRIL) tablet 10 mg: 10 mg | ORAL | @ 12:00:00

## 2020-12-02 MED ADMIN — magnesium oxide (MAG-OX) tablet 400 mg: 400 mg | ORAL

## 2020-12-02 MED ADMIN — tobramycin (NEBCIN) 280 mg in sodium chloride (NS) 0.9 % 100 mL IVPB: 280 mg | INTRAVENOUS | @ 14:00:00 | Stop: 2020-12-07

## 2020-12-02 MED ADMIN — pancrelipase (Lip-Prot-Amyl) (CREON) 24,000-76,000 -120,000 unit delayed release capsule 192,000 units of lipase: 8 | ORAL | @ 22:00:00

## 2020-12-02 MED ADMIN — pancrelipase (Lip-Prot-Amyl) (CREON) 24,000-76,000 -120,000 unit delayed release capsule 192,000 units of lipase: 8 | ORAL

## 2020-12-02 MED ADMIN — sodium chloride 7% nebulizer solution 4 mL: 4 mL | RESPIRATORY_TRACT | @ 01:00:00

## 2020-12-02 MED ADMIN — tobramycin (NEBCIN) 280 mg in sodium chloride (NS) 0.9 % 100 mL IVPB: 280 mg | INTRAVENOUS | @ 02:00:00 | Stop: 2020-12-07

## 2020-12-02 MED ADMIN — dornase alfa (PULMOZYME) 1 mg/mL solution 2.5 mg: 2.5 mg | RESPIRATORY_TRACT | @ 13:00:00

## 2020-12-02 MED ADMIN — elexacaftor-tezacaftor-ivacaft (TRIKAFTA) tablet 1 tablet: 1 | ORAL

## 2020-12-02 MED ADMIN — piperacillin-tazobactam (ZOSYN) IVPB (premix) 4.5 g: 4.5 g | INTRAVENOUS | @ 22:00:00 | Stop: 2020-12-09

## 2020-12-02 MED ADMIN — atorvastatin (LIPITOR) tablet 20 mg: 20 mg | ORAL

## 2020-12-02 MED ADMIN — gabapentin (NEURONTIN) capsule 600 mg: 600 mg | ORAL | @ 12:00:00

## 2020-12-02 MED ADMIN — pediatric multivitamin-vit D3 1,500 unit-vit K 800 mcg (MVW COMPLETE FORMULATION) capsule: 1 | ORAL

## 2020-12-02 MED ADMIN — pramipexole (MIRAPEX) tablet 0.5 mg: .5 mg | ORAL | @ 22:00:00

## 2020-12-02 MED ADMIN — ondansetron (ZOFRAN) injection 4 mg: 4 mg | INTRAVENOUS | @ 14:00:00

## 2020-12-02 MED ADMIN — prochlorperazine (COMPAZINE) injection 5 mg: 5 mg | INTRAVENOUS | @ 17:00:00

## 2020-12-02 MED ADMIN — elexacaftor-tezacaftor-ivacaft (TRIKAFTA) tablet 2 tablet **Patient Supplied**: 2 | ORAL | @ 12:00:00

## 2020-12-02 MED ADMIN — insulin lispro (HumaLOG) injection 0-12 Units: 0-12 [IU] | SUBCUTANEOUS | @ 02:00:00

## 2020-12-02 MED ADMIN — pediatric multivitamin-vit D3 1,500 unit-vit K 800 mcg (MVW COMPLETE FORMULATION) capsule: 1 | ORAL | @ 12:00:00

## 2020-12-02 MED ADMIN — lamoTRIgine (LaMICtal) tablet 200 mg: 200 mg | ORAL | @ 12:00:00

## 2020-12-02 MED ADMIN — traZODone (DESYREL) tablet 100 mg: 100 mg | ORAL

## 2020-12-02 MED ADMIN — sodium chloride 7% nebulizer solution 4 mL: 4 mL | RESPIRATORY_TRACT | @ 18:00:00

## 2020-12-02 MED ADMIN — ondansetron (ZOFRAN) injection 4 mg: 4 mg | INTRAVENOUS | @ 02:00:00

## 2020-12-02 MED ADMIN — pancrelipase (Lip-Prot-Amyl) (CREON) 24,000-76,000 -120,000 unit delayed release capsule 192,000 units of lipase: 8 | ORAL | @ 16:00:00

## 2020-12-02 MED ADMIN — melatonin tablet 10.5 mg: 10 mg | ORAL

## 2020-12-02 MED ADMIN — albuterol nebulizer solution 2.5 mg: 2.5 mg | RESPIRATORY_TRACT | @ 13:00:00

## 2020-12-02 MED ADMIN — cetirizine (ZyrTEC) tablet 10 mg: 10 mg | ORAL | @ 12:00:00

## 2020-12-02 MED ADMIN — rivaroxaban (XARELTO) tablet 20 mg: 20 mg | ORAL | @ 22:00:00

## 2020-12-02 MED ADMIN — sodium chloride 7% nebulizer solution 4 mL: 4 mL | RESPIRATORY_TRACT | @ 13:00:00

## 2020-12-02 MED ADMIN — piperacillin-tazobactam (ZOSYN) IVPB (premix) 4.5 g: 4.5 g | INTRAVENOUS | @ 10:00:00 | Stop: 2020-12-09

## 2020-12-02 MED ADMIN — dornase alfa (PULMOZYME) 1 mg/mL solution 2.5 mg: 2.5 mg | RESPIRATORY_TRACT | @ 01:00:00

## 2020-12-02 MED ADMIN — pantoprazole (PROTONIX) EC tablet 20 mg: 20 mg | ORAL | @ 12:00:00

## 2020-12-02 MED ADMIN — oxyCODONE (ROXICODONE) immediate release tablet 5 mg: 5 mg | ORAL | @ 12:00:00 | Stop: 2020-12-08

## 2020-12-02 MED ADMIN — magnesium oxide (MAG-OX) tablet 400 mg: 400 mg | ORAL | @ 12:00:00

## 2020-12-02 MED ADMIN — piperacillin-tazobactam (ZOSYN) IVPB (premix) 4.5 g: 4.5 g | INTRAVENOUS | @ 16:00:00 | Stop: 2020-12-09

## 2020-12-02 MED ADMIN — pancrelipase (Lip-Prot-Amyl) (CREON) 24,000-76,000 -120,000 unit delayed release capsule 192,000 units of lipase: 8 | ORAL | @ 12:00:00

## 2020-12-02 MED ADMIN — albuterol nebulizer solution 2.5 mg: 2.5 mg | RESPIRATORY_TRACT | @ 18:00:00

## 2020-12-02 MED ADMIN — famotidine (PEPCID) tablet 20 mg: 20 mg | ORAL | @ 12:00:00

## 2020-12-02 MED ADMIN — FLUoxetine (PROzac) capsule 60 mg: 60 mg | ORAL | @ 12:00:00

## 2020-12-02 MED ADMIN — phytonadione (vitamin K1) (MEPHYTON) tablet 5 mg: 5 mg | ORAL | @ 10:00:00 | Stop: 2020-12-09

## 2020-12-02 MED ADMIN — piperacillin-tazobactam (ZOSYN) IVPB (premix) 4.5 g: 4.5 g | INTRAVENOUS | @ 05:00:00 | Stop: 2020-12-09

## 2020-12-02 MED ADMIN — oxyCODONE (ROXICODONE) immediate release tablet 5 mg: 5 mg | ORAL | @ 02:00:00 | Stop: 2020-12-08

## 2020-12-02 MED ADMIN — albuterol nebulizer solution 2.5 mg: 2.5 mg | RESPIRATORY_TRACT | @ 01:00:00

## 2020-12-02 MED ADMIN — amitriptyline (ELAVIL) tablet 100 mg: 100 mg | ORAL | @ 02:00:00

## 2020-12-02 MED ADMIN — piperacillin-tazobactam (ZOSYN) IVPB (premix) 4.5 g: 4.5 g | INTRAVENOUS | Stop: 2020-12-09

## 2020-12-02 NOTE — Unmapped (Signed)
PULMONARY CONSULT  NOTE      Patient: Christian Young(30-Oct-1985)  Reason for consultation: Christian Young is a 35 y.o. male who is seen in consultation at the request of Lillia Carmel, MD for comprehensive evaluation of CF exacerbation.        Assessment and Recommendations:      Principal Problem:    Cystic fibrosis with pulmonary exacerbation (CMS-HCC)  Active Problems:    Essential hypertension    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)    Pancreatic insufficiency due to cystic fibrosis (CMS-HCC)    Chronic deep vein thrombosis (DVT) of lower extremity (CMS-HCC)  Resolved Problems:    * No resolved hospital problems. *    Christian Young is a 35 y.o. male with PMHx of CF who presented to [facility] with Cystic fibrosis with pulmonary exacerbation (CMS-HCC).  ??  Acute exacerbation of CF bronchiectasis: Has grown MSSA and 2 variants of PSAS (one pan resistant and one with intermediate resistance), unable to produce sputum sample here. Spirometry 4/21 with FEV1 at baseline.  - Zosyn 4.5 mg q6h, tobramycin IV, pharmacy to dose, does BID dosing, Mg with BMP while on Tobramycin (twice weekly is fine).  - Plan for at least 14 days of antibiotics.  Will likely need to be completed as an inpatient (has done both here/home, feels current supports make it difficult to complete treatment at home). By my count he should be dosed through 4/28, ?discharge 4/28 vs 4/29.  - Continue Trikafta (non-formulary), Breo (formulary alternative for Symbicort) and Pulmozyme  - Airway clearance with albuterol TID, 7% hypertonic saline, chest vest, and aerobika  - INR elevated, s/p vit K daily for 5 days, though suspecting influenced by Xarelto use. Now twice weekly while on IV abx.     Nausea: Not typical symptom for patient while on IV antibiotics but now persistent here. Lab evaluation and exam unremarkable.  - Continue PRN Zofran or Compazine (right now he prefers latter).  - Continue pancreatic enzymes with Creon.  - Continue to ensure having at least one bowel movement/day; admission earlier this year locally for ?pSBO/ileus, possible DIOS, daily Miralax (patient can refuse if not needing).      We appreciate the opportunity to assist in the care of this patient.  Please page [phone] with any questions.    Cecelia Byars, MD    Subjective:      History of Present Illness:  Christian Young is a 35 y.o. male admitted for CF exacerbation.  Has had recent pleuritic chest pain and fever.  Recommended by primary CF doc to present for admission and IV Abx.    Interval history 12/02/20: Feeling definitely better today. Finds Compazine helping his more than Zofran, does make him more tired though but okay with this.    Past medical, past surgical, family, and social histories and allergies reviewed from initial admission note and otherwise in Epic.     Review of Systems: A comprehensive review of systems was performed and was negative except as above in HPI.     Objective:      Physical Exam:  Vitals:    12/01/20 1200 12/01/20 2116 12/02/20 0630 12/02/20 1135   BP: 124/84  111/81 124/86   Pulse: 90 95 74 85   Resp: 18 16 18 18    Temp: 36.7 ??C (98.1 ??F)  36.5 ??C (97.7 ??F) 36.4 ??C (97.5 ??F)   TempSrc: Oral  Oral Oral   SpO2: 96% 97% 98% 100%  Weight:       Height:           General: sitting up in chair, no respiratory distress  HEENT: anicteric sclera, moist mucous membranes  CV: RRR, no murmurs, rubs, or gallops. Port site benign.  Pulm: normal work of breathing, Good air movement. CTAB  Abd: soft, non-tender, slight distension, normal bowel sounds  Ext: warm and well perfused, no edema  Neuro: grossly intact  Psych: appropriate mood and affect    Malnutrition Assessment by RD:          Diagnostic Review:   All labs and images were personally reviewed.

## 2020-12-02 NOTE — Unmapped (Signed)
Pt is A/ O x 4, is able to ambulate independently, VSS, with no apparent respiratory distress. POC, isolation precaution maintained, received scheduled doses of zosyn and tobramycin. Tolerated therapy well, pt is resting well at this time, will continue to monitor.   Problem: Adult Inpatient Plan of Care  Goal: Plan of Care Review  Outcome: Progressing  Goal: Patient-Specific Goal (Individualized)  Outcome: Progressing  Goal: Absence of Hospital-Acquired Illness or Injury  Outcome: Progressing  Intervention: Identify and Manage Fall Risk  Recent Flowsheet Documentation  Taken 12/02/2020 0200 by Oletta Cohn, RN  Safety Interventions:   fall reduction program maintained   infection management   isolation precautions  Taken 12/01/2020 2200 by Oletta Cohn, RN  Safety Interventions:   infection management   fall reduction program maintained   lighting adjusted for tasks/safety   low bed  Taken 12/01/2020 2000 by Oletta Cohn, RN  Safety Interventions:   lighting adjusted for tasks/safety   low bed   fall reduction program maintained  Intervention: Prevent and Manage VTE (Venous Thromboembolism) Risk  Recent Flowsheet Documentation  Taken 12/01/2020 2000 by Oletta Cohn, RN  Activity Management:   activity adjusted per tolerance   activity encouraged  Intervention: Prevent Infection  Recent Flowsheet Documentation  Taken 12/02/2020 0200 by Oletta Cohn, RN  Infection Prevention:   environmental surveillance performed   equipment surfaces disinfected   hand hygiene promoted  Taken 12/02/2020 0000 by Oletta Cohn, RN  Infection Prevention:   environmental surveillance performed   equipment surfaces disinfected   hand hygiene promoted  Taken 12/01/2020 2200 by Oletta Cohn, RN  Infection Prevention:   environmental surveillance performed   equipment surfaces disinfected   hand hygiene promoted  Taken 12/01/2020 2000 by Oletta Cohn, RN  Infection Prevention:   equipment surfaces disinfected   environmental surveillance performed   hand hygiene promoted  Goal: Optimal Comfort and Wellbeing  Outcome: Progressing  Goal: Readiness for Transition of Care  Outcome: Progressing  Goal: Rounds/Family Conference  Outcome: Progressing     Problem: Infection  Goal: Absence of Infection Signs and Symptoms  Outcome: Progressing  Intervention: Prevent or Manage Infection  Recent Flowsheet Documentation  Taken 12/02/2020 0200 by Oletta Cohn, RN  Infection Management: aseptic technique maintained  Isolation Precautions: contact precautions maintained  Taken 12/02/2020 0000 by Oletta Cohn, RN  Infection Management: aseptic technique maintained  Isolation Precautions: contact precautions discontinued  Taken 12/01/2020 2200 by Oletta Cohn, RN  Infection Management: aseptic technique maintained  Isolation Precautions: contact precautions maintained  Taken 12/01/2020 2000 by Oletta Cohn, RN  Infection Management: aseptic technique maintained  Isolation Precautions: contact precautions maintained     Problem: Adjustment to Illness (Cystic Fibrosis)  Goal: Optimal Coping  Outcome: Progressing     Problem: Infection (Cystic Fibrosis)  Goal: Absence of Infection Signs and Symptoms  Outcome: Progressing  Intervention: Manage Infection and Prevent Transmission  Recent Flowsheet Documentation  Taken 12/02/2020 0200 by Oletta Cohn, RN  Infection Management: aseptic technique maintained  Isolation Precautions: contact precautions maintained  Taken 12/02/2020 0000 by Oletta Cohn, RN  Infection Management: aseptic technique maintained  Isolation Precautions: contact precautions discontinued  Taken 12/01/2020 2200 by Oletta Cohn, RN  Infection Management: aseptic technique maintained  Isolation Precautions: contact precautions maintained  Taken 12/01/2020 2000 by Oletta Cohn, RN  Infection Management: aseptic technique maintained  Isolation Precautions: contact precautions maintained     Problem: Oral Intake Inadequate (Cystic Fibrosis)  Goal: Optimal Nutrition Intake  Outcome: Progressing  Problem: Malabsorption (Cystic Fibrosis)  Goal: Optimal Bowel Elimination  Outcome: Progressing     Problem: Respiratory Compromise (Cystic Fibrosis)  Goal: Effective Oxygenation and Ventilation  Outcome: Progressing     Problem: Airway Clearance Ineffective  Goal: Effective Airway Clearance  Outcome: Progressing  Intervention: Promote Airway Secretion Clearance  Recent Flowsheet Documentation  Taken 12/01/2020 2000 by Oletta Cohn, RN  Activity Management:   activity adjusted per tolerance   activity encouraged     Problem: Diabetes Comorbidity  Goal: Blood Glucose Level Within Targeted Range  Outcome: Progressing     Problem: Hypertension Comorbidity  Goal: Blood Pressure in Desired Range  Outcome: Progressing

## 2020-12-02 NOTE — Unmapped (Signed)
Pt alert and oriented x4. Pt in NAD, VSS. IVABX given per order. Pt independent of care.      Problem: Adult Inpatient Plan of Care  Goal: Plan of Care Review  Outcome: Progressing  Goal: Patient-Specific Goal (Individualized)  Outcome: Progressing  Goal: Absence of Hospital-Acquired Illness or Injury  Outcome: Progressing  Intervention: Identify and Manage Fall Risk  Recent Flowsheet Documentation  Taken 12/01/2020 0800 by Edythe Clarity, RN  Safety Interventions:   lighting adjusted for tasks/safety   fall reduction program maintained   isolation precautions  Goal: Optimal Comfort and Wellbeing  Outcome: Progressing  Goal: Readiness for Transition of Care  Outcome: Progressing  Goal: Rounds/Family Conference  Outcome: Progressing     Problem: Infection  Goal: Absence of Infection Signs and Symptoms  Outcome: Progressing  Intervention: Prevent or Manage Infection  Recent Flowsheet Documentation  Taken 12/01/2020 0800 by Edythe Clarity, RN  Isolation Precautions: contact precautions maintained     Problem: Adjustment to Illness (Cystic Fibrosis)  Goal: Optimal Coping  Outcome: Progressing     Problem: Infection (Cystic Fibrosis)  Goal: Absence of Infection Signs and Symptoms  Outcome: Progressing  Intervention: Manage Infection and Prevent Transmission  Recent Flowsheet Documentation  Taken 12/01/2020 0800 by Edythe Clarity, RN  Isolation Precautions: contact precautions maintained     Problem: Oral Intake Inadequate (Cystic Fibrosis)  Goal: Optimal Nutrition Intake  Outcome: Progressing     Problem: Malabsorption (Cystic Fibrosis)  Goal: Optimal Bowel Elimination  Outcome: Progressing     Problem: Respiratory Compromise (Cystic Fibrosis)  Goal: Effective Oxygenation and Ventilation  Outcome: Progressing     Problem: Airway Clearance Ineffective  Goal: Effective Airway Clearance  Outcome: Progressing     Problem: Diabetes Comorbidity  Goal: Blood Glucose Level Within Targeted Range  Outcome: Progressing     Problem: Hypertension Comorbidity  Goal: Blood Pressure in Desired Range  Outcome: Progressing

## 2020-12-02 NOTE — Unmapped (Signed)
Hospitalist Daily Progress Note     LOS: 8 days       Assessment/Plan:  Principal Problem:    Cystic fibrosis with pulmonary exacerbation (CMS-HCC)  Active Problems:    Essential hypertension    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)    Pancreatic insufficiency due to cystic fibrosis (CMS-HCC)    Chronic deep vein thrombosis (DVT) of lower extremity (CMS-HCC)  Resolved Problems:    * No resolved hospital problems. *       Christian Young is a 35 y.o. male with PMHx of CF who presented to [facility] with Cystic fibrosis with pulmonary exacerbation.    Acute exacerbation of CF bronchiectasis: Admitted for 14d IV abx. Hx MSSA and 2 variants of PSAS (one pan resistant and one with intermediate resistance). AFB smear negative, blood cultures negative. Pt unable to produce specimen for new culture. MRSA screen negative. PFTs 12/01/20 unchanged from prior  - Pulmonology consult following, input appreciated.   - Zosyn 4.5 mg q6h - Note: patient has had cefepime allergy and developed AIN from Zosyn.    Continue to monitor closely, CMP and CBC daily.  - Tobramycin??IV, pharmacy to dose, Mg with BMP while on Tobramycin.  - Continue Trikafta (non-form), Breo (for Symbicort) and Pulmozyme  - Airway clearance with albuterol TID, 7% hypertonic saline, chest vest, and aerobika  - INR elevated 1.45--> 1.8 despite vitamin K daily for 5 days, likely r/t Xarelto. Resume vit K dose twice weekly while on IV abx.     Diabetes mellitus related to cystic fibrosis: Blood sugars intermittently elevated.  - Lantus 50 units nightly, sliding scale  - high-calorie high-protein diet.     Hx possible pSBO/ileus vs DIOS. Miralax TID, senna. Encourage daily BM goal  Pancreatic insufficiency due to CF: Creon 8/meals, 4/snacks. ADEK vitamins  Hx DVT: continue Xarelto 20. Provide vitamin K twice weekly while on IV abx.   Mild hypokalemia, hypomagnesium: Replete PO/IV and monitor daily    PPX: rivaroxaban, ppi + h2blk  Disposition: Inpatient 14 days of intravenous antibiotics (through 4/27)    Please page the [facility]) pager at [phone] with questions.      Subjective:   States he feels better c/t 1 wk ago  Yesterday: oxycodone x1 for pleuritic CP, IV zofran x2 + IV compazine x1 for N  4/21: BG 142-306, SSI 2+8  BMs apparently not being recorded by RN, but pt reports BM QOD which he is satisfied with.  Encouraging him to have daily BM, discussed today.  No issues or complaints today, discussed w/ Pulm as well      Objective:     Vital signs in last 24 hours:  Temp:  [36.5 ??C (97.7 ??F)-36.7 ??C (98.1 ??F)] 36.5 ??C (97.7 ??F)  Heart Rate:  [74-95] 74  Resp:  [16-18] 18  BP: (111-124)/(81-84) 111/81  MAP (mmHg):  [91-96] 91  SpO2:  [96 %-98 %] 98 %    Labs reviewed and significant for:  4/22  CBC, CMP unremarkable    Physical Exam  General: Sitting in recliner playing video games, alert, conversant  ENT: MMM  CV: RRR, no murmurs  Pulm: CTAB, normal WOB  Abd: NABS, soft, non-tender, non-distended  Ext: No edema.  Neuro: Speech fluent, cognition intact  Psych: normal mood and affect  PORT in place L upper chest

## 2020-12-03 LAB — TOBRAMYCIN LEVEL, PEAK: TOBRAMYCIN PEAK: 6.4 ug/mL (ref 4.0–10.0)

## 2020-12-03 LAB — CBC W/ AUTO DIFF
BASOPHILS ABSOLUTE COUNT: 0.1 10*9/L (ref 0.0–0.1)
BASOPHILS RELATIVE PERCENT: 1.4 %
EOSINOPHILS ABSOLUTE COUNT: 0.3 10*9/L (ref 0.0–0.5)
EOSINOPHILS RELATIVE PERCENT: 5.7 %
HEMATOCRIT: 37.2 % — ABNORMAL LOW (ref 39.0–48.0)
HEMOGLOBIN: 12.7 g/dL — ABNORMAL LOW (ref 12.9–16.5)
LYMPHOCYTES ABSOLUTE COUNT: 1.3 10*9/L (ref 1.1–3.6)
LYMPHOCYTES RELATIVE PERCENT: 26.7 %
MEAN CORPUSCULAR HEMOGLOBIN CONC: 34.3 g/dL (ref 32.0–36.0)
MEAN CORPUSCULAR HEMOGLOBIN: 29.3 pg (ref 25.9–32.4)
MEAN CORPUSCULAR VOLUME: 85.6 fL (ref 77.6–95.7)
MEAN PLATELET VOLUME: 7.5 fL (ref 6.8–10.7)
MONOCYTES ABSOLUTE COUNT: 0.6 10*9/L (ref 0.3–0.8)
MONOCYTES RELATIVE PERCENT: 12.3 %
NEUTROPHILS ABSOLUTE COUNT: 2.5 10*9/L (ref 1.8–7.8)
NEUTROPHILS RELATIVE PERCENT: 53.9 %
NUCLEATED RED BLOOD CELLS: 0 /100{WBCs} (ref <=4)
PLATELET COUNT: 274 10*9/L (ref 150–450)
RED BLOOD CELL COUNT: 4.34 10*12/L (ref 4.26–5.60)
RED CELL DISTRIBUTION WIDTH: 14 % (ref 12.2–15.2)
WBC ADJUSTED: 4.7 10*9/L (ref 3.6–11.2)

## 2020-12-03 LAB — COMPREHENSIVE METABOLIC PANEL
ALBUMIN: 3.6 g/dL (ref 3.4–5.0)
ALKALINE PHOSPHATASE: 55 U/L (ref 46–116)
ALT (SGPT): 30 U/L (ref 10–49)
ANION GAP: 6 mmol/L (ref 5–14)
AST (SGOT): 18 U/L (ref <=34)
BILIRUBIN TOTAL: 0.6 mg/dL (ref 0.3–1.2)
BLOOD UREA NITROGEN: 14 mg/dL (ref 9–23)
BUN / CREAT RATIO: 18
CALCIUM: 8.9 mg/dL (ref 8.7–10.4)
CHLORIDE: 101 mmol/L (ref 98–107)
CO2: 32.7 mmol/L — ABNORMAL HIGH (ref 20.0–31.0)
CREATININE: 0.79 mg/dL
EGFR CKD-EPI AA MALE: >90 mL/min/{1.73_m2} (ref >=60)
EGFR CKD-EPI NON-AA MALE: >90 mL/min/{1.73_m2} (ref >=60)
GLUCOSE RANDOM: 79 mg/dL (ref 70–179)
POTASSIUM: 3.9 mmol/L (ref 3.4–4.8)
PROTEIN TOTAL: 6.8 g/dL (ref 5.7–8.2)
SODIUM: 140 mmol/L (ref 135–145)

## 2020-12-03 LAB — TOBRAMYCIN LEVEL, RANDOM: TOBRAMYCIN RANDOM: 1.1 ug/mL

## 2020-12-03 LAB — MAGNESIUM: MAGNESIUM: 2 mg/dL (ref 1.6–2.6)

## 2020-12-03 MED ADMIN — pancrelipase (Lip-Prot-Amyl) (CREON) 24,000-76,000 -120,000 unit delayed release capsule 192,000 units of lipase: 8 | ORAL | @ 16:00:00

## 2020-12-03 MED ADMIN — tobramycin (NEBCIN) 280 mg in sodium chloride (NS) 0.9 % 100 mL IVPB: 280 mg | INTRAVENOUS | @ 14:00:00 | Stop: 2020-12-07

## 2020-12-03 MED ADMIN — piperacillin-tazobactam (ZOSYN) IVPB (premix) 4.5 g: 4.5 g | INTRAVENOUS | @ 06:00:00 | Stop: 2020-12-09

## 2020-12-03 MED ADMIN — ondansetron (ZOFRAN) injection 4 mg: 4 mg | INTRAVENOUS | @ 22:00:00

## 2020-12-03 MED ADMIN — elexacaftor-tezacaftor-ivacaft (TRIKAFTA) tablet 2 tablet **Patient Supplied**: 2 | ORAL | @ 14:00:00

## 2020-12-03 MED ADMIN — elexacaftor-tezacaftor-ivacaft (TRIKAFTA) tablet 1 tablet: 1 | ORAL | @ 01:00:00

## 2020-12-03 MED ADMIN — dornase alfa (PULMOZYME) 1 mg/mL solution 2.5 mg: 2.5 mg | RESPIRATORY_TRACT | @ 01:00:00

## 2020-12-03 MED ADMIN — rivaroxaban (XARELTO) tablet 20 mg: 20 mg | ORAL | @ 22:00:00

## 2020-12-03 MED ADMIN — pantoprazole (PROTONIX) EC tablet 20 mg: 20 mg | ORAL | @ 14:00:00

## 2020-12-03 MED ADMIN — pancrelipase (Lip-Prot-Amyl) (CREON) 24,000-76,000 -120,000 unit delayed release capsule 192,000 units of lipase: 8 | ORAL | @ 23:00:00

## 2020-12-03 MED ADMIN — pramipexole (MIRAPEX) tablet 0.5 mg: .5 mg | ORAL | @ 22:00:00

## 2020-12-03 MED ADMIN — traZODone (DESYREL) tablet 100 mg: 100 mg | ORAL | @ 01:00:00

## 2020-12-03 MED ADMIN — piperacillin-tazobactam (ZOSYN) IVPB (premix) 4.5 g: 4.5 g | INTRAVENOUS | @ 18:00:00 | Stop: 2020-12-09

## 2020-12-03 MED ADMIN — tobramycin (NEBCIN) 280 mg in sodium chloride (NS) 0.9 % 100 mL IVPB: 280 mg | INTRAVENOUS | @ 02:00:00 | Stop: 2020-12-07

## 2020-12-03 MED ADMIN — senna (SENOKOT) tablet 2 tablet: 2 | ORAL | @ 01:00:00

## 2020-12-03 MED ADMIN — famotidine (PEPCID) tablet 20 mg: 20 mg | ORAL | @ 14:00:00

## 2020-12-03 MED ADMIN — polyethylene glycol (MIRALAX) packet 17 g: 17 g | ORAL | @ 18:00:00

## 2020-12-03 MED ADMIN — amitriptyline (ELAVIL) tablet 100 mg: 100 mg | ORAL | @ 01:00:00

## 2020-12-03 MED ADMIN — oxyCODONE (ROXICODONE) immediate release tablet 5 mg: 5 mg | ORAL | @ 22:00:00 | Stop: 2020-12-08

## 2020-12-03 MED ADMIN — pediatric multivitamin-vit D3 1,500 unit-vit K 800 mcg (MVW COMPLETE FORMULATION) capsule: 1 | ORAL | @ 01:00:00

## 2020-12-03 MED ADMIN — insulin glargine (LANTUS) injection 50 Units: 50 [IU] | SUBCUTANEOUS | @ 01:00:00

## 2020-12-03 MED ADMIN — fluticasone propionate (FLONASE) 50 mcg/actuation nasal spray 1 spray: 1 | NASAL | @ 14:00:00

## 2020-12-03 MED ADMIN — polyethylene glycol (MIRALAX) packet 17 g: 17 g | ORAL | @ 14:00:00

## 2020-12-03 MED ADMIN — oxyCODONE (ROXICODONE) immediate release tablet 5 mg: 5 mg | ORAL | @ 14:00:00 | Stop: 2020-12-08

## 2020-12-03 MED ADMIN — ondansetron (ZOFRAN) injection 4 mg: 4 mg | INTRAVENOUS | @ 01:00:00

## 2020-12-03 MED ADMIN — piperacillin-tazobactam (ZOSYN) IVPB (premix) 4.5 g: 4.5 g | INTRAVENOUS | @ 23:00:00 | Stop: 2020-12-09

## 2020-12-03 MED ADMIN — dornase alfa (PULMOZYME) 1 mg/mL solution 2.5 mg: 2.5 mg | RESPIRATORY_TRACT | @ 14:00:00

## 2020-12-03 MED ADMIN — sodium chloride 7% nebulizer solution 4 mL: 4 mL | RESPIRATORY_TRACT | @ 18:00:00

## 2020-12-03 MED ADMIN — insulin lispro (HumaLOG) injection 0-12 Units: 0-12 [IU] | SUBCUTANEOUS | @ 16:00:00

## 2020-12-03 MED ADMIN — sodium chloride 7% nebulizer solution 4 mL: 4 mL | RESPIRATORY_TRACT | @ 01:00:00

## 2020-12-03 MED ADMIN — sodium chloride 7% nebulizer solution 4 mL: 4 mL | RESPIRATORY_TRACT | @ 14:00:00

## 2020-12-03 MED ADMIN — ondansetron (ZOFRAN) injection 4 mg: 4 mg | INTRAVENOUS | @ 14:00:00

## 2020-12-03 MED ADMIN — magnesium oxide (MAG-OX) tablet 400 mg: 400 mg | ORAL | @ 14:00:00

## 2020-12-03 MED ADMIN — albuterol nebulizer solution 2.5 mg: 2.5 mg | RESPIRATORY_TRACT | @ 18:00:00

## 2020-12-03 MED ADMIN — atorvastatin (LIPITOR) tablet 20 mg: 20 mg | ORAL | @ 01:00:00

## 2020-12-03 MED ADMIN — oxyCODONE (ROXICODONE) immediate release tablet 5 mg: 5 mg | ORAL | @ 01:00:00 | Stop: 2020-12-08

## 2020-12-03 MED ADMIN — albuterol nebulizer solution 2.5 mg: 2.5 mg | RESPIRATORY_TRACT | @ 01:00:00

## 2020-12-03 MED ADMIN — cetirizine (ZyrTEC) tablet 10 mg: 10 mg | ORAL | @ 14:00:00

## 2020-12-03 MED ADMIN — melatonin tablet 10.5 mg: 10 mg | ORAL | @ 01:00:00

## 2020-12-03 MED ADMIN — polyethylene glycol (MIRALAX) packet 17 g: 17 g | ORAL | @ 01:00:00

## 2020-12-03 MED ADMIN — piperacillin-tazobactam (ZOSYN) IVPB (premix) 4.5 g: 4.5 g | INTRAVENOUS | @ 11:00:00 | Stop: 2020-12-09

## 2020-12-03 MED ADMIN — albuterol nebulizer solution 2.5 mg: 2.5 mg | RESPIRATORY_TRACT | @ 14:00:00

## 2020-12-03 MED ADMIN — FLUoxetine (PROzac) capsule 60 mg: 60 mg | ORAL | @ 14:00:00

## 2020-12-03 MED ADMIN — insulin lispro (HumaLOG) injection 0-12 Units: 0-12 [IU] | SUBCUTANEOUS | @ 14:00:00

## 2020-12-03 MED ADMIN — pancrelipase (Lip-Prot-Amyl) (CREON) 24,000-76,000 -120,000 unit delayed release capsule 192,000 units of lipase: 8 | ORAL | @ 14:00:00

## 2020-12-03 MED ADMIN — magnesium oxide (MAG-OX) tablet 400 mg: 400 mg | ORAL | @ 01:00:00

## 2020-12-03 MED ADMIN — lisinopriL (PRINIVIL,ZESTRIL) tablet 10 mg: 10 mg | ORAL | @ 14:00:00

## 2020-12-03 MED ADMIN — lamoTRIgine (LaMICtal) tablet 200 mg: 200 mg | ORAL | @ 14:00:00

## 2020-12-03 MED ADMIN — fluticasone furoate-vilanteroL (BREO ELLIPTA) 200-25 mcg/dose inhaler 1 puff: 1 | RESPIRATORY_TRACT | @ 14:00:00

## 2020-12-03 MED ADMIN — insulin lispro (HumaLOG) injection 0-12 Units: 0-12 [IU] | SUBCUTANEOUS | @ 22:00:00

## 2020-12-03 MED ADMIN — gabapentin (NEURONTIN) capsule 600 mg: 600 mg | ORAL | @ 14:00:00

## 2020-12-03 MED ADMIN — insulin lispro (HumaLOG) injection 0-12 Units: 0-12 [IU] | SUBCUTANEOUS | @ 01:00:00

## 2020-12-03 MED ADMIN — pediatric multivitamin-vit D3 1,500 unit-vit K 800 mcg (MVW COMPLETE FORMULATION) capsule: 1 | ORAL | @ 14:00:00

## 2020-12-03 NOTE — Unmapped (Signed)
POC, VSS, BG monitoring and insulin administered as ordered. Contact precaution maintained. 5 mg of Oxy administered for pain with much relief. Pt resting well at this time, will continue to monitor.  Problem: Adult Inpatient Plan of Care  Goal: Plan of Care Review  12/03/2020 0359 by Oletta Cohn, RN  Outcome: Progressing  12/03/2020 0304 by Oletta Cohn, RN  Outcome: Progressing  Goal: Patient-Specific Goal (Individualized)  12/03/2020 0359 by Oletta Cohn, RN  Outcome: Progressing  12/03/2020 0304 by Oletta Cohn, RN  Outcome: Progressing  Goal: Absence of Hospital-Acquired Illness or Injury  12/03/2020 0359 by Oletta Cohn, RN  Outcome: Progressing  12/03/2020 0304 by Oletta Cohn, RN  Outcome: Progressing  Intervention: Identify and Manage Fall Risk  Recent Flowsheet Documentation  Taken 12/03/2020 0200 by Oletta Cohn, RN  Safety Interventions:   lighting adjusted for tasks/safety   low bed  Taken 12/03/2020 0000 by Oletta Cohn, RN  Safety Interventions:   lighting adjusted for tasks/safety   low bed  Taken 12/02/2020 2200 by Oletta Cohn, RN  Safety Interventions:   lighting adjusted for tasks/safety   low bed  Taken 12/02/2020 2000 by Oletta Cohn, RN  Safety Interventions:   lighting adjusted for tasks/safety   low bed  Intervention: Prevent and Manage VTE (Venous Thromboembolism) Risk  Recent Flowsheet Documentation  Taken 12/02/2020 2000 by Oletta Cohn, RN  Activity Management:   activity adjusted per tolerance   activity encouraged  Intervention: Prevent Infection  Recent Flowsheet Documentation  Taken 12/03/2020 0200 by Oletta Cohn, RN  Infection Prevention:   equipment surfaces disinfected   hand hygiene promoted  Taken 12/02/2020 2000 by Oletta Cohn, RN  Infection Prevention:   equipment surfaces disinfected   hand hygiene promoted   personal protective equipment utilized  Goal: Optimal Comfort and Wellbeing  12/03/2020 0359 by Oletta Cohn, RN  Outcome: Progressing  12/03/2020 0304 by Oletta Cohn, RN  Outcome: Progressing  Goal: Readiness for Transition of Care  12/03/2020 0359 by Oletta Cohn, RN  Outcome: Progressing  12/03/2020 0304 by Oletta Cohn, RN  Outcome: Progressing  Goal: Rounds/Family Conference  12/03/2020 0359 by Oletta Cohn, RN  Outcome: Progressing  12/03/2020 0304 by Oletta Cohn, RN  Outcome: Progressing     Problem: Infection  Goal: Absence of Infection Signs and Symptoms  12/03/2020 0359 by Oletta Cohn, RN  Outcome: Progressing  12/03/2020 0304 by Oletta Cohn, RN  Outcome: Progressing  Intervention: Prevent or Manage Infection  Recent Flowsheet Documentation  Taken 12/03/2020 0200 by Oletta Cohn, RN  Infection Management: aseptic technique maintained  Isolation Precautions: contact precautions maintained  Taken 12/03/2020 0000 by Oletta Cohn, RN  Isolation Precautions: contact precautions maintained  Taken 12/02/2020 2200 by Oletta Cohn, RN  Isolation Precautions: contact precautions maintained  Taken 12/02/2020 2000 by Oletta Cohn, RN  Infection Management: aseptic technique maintained  Isolation Precautions: contact precautions maintained     Problem: Adjustment to Illness (Cystic Fibrosis)  Goal: Optimal Coping  12/03/2020 0359 by Oletta Cohn, RN  Outcome: Progressing  12/03/2020 0304 by Oletta Cohn, RN  Outcome: Progressing     Problem: Infection (Cystic Fibrosis)  Goal: Absence of Infection Signs and Symptoms  12/03/2020 0359 by Oletta Cohn, RN  Outcome: Progressing  12/03/2020 0304 by Oletta Cohn, RN  Outcome: Progressing  Intervention: Manage Infection and Prevent Transmission  Recent Flowsheet Documentation  Taken 12/03/2020 0200 by Oletta Cohn, RN  Infection Management: aseptic technique maintained  Isolation Precautions: contact precautions maintained  Taken 12/03/2020 0000 by Oletta Cohn,  RN  Isolation Precautions: contact precautions maintained  Taken 12/02/2020 2200 by Oletta Cohn, RN  Isolation Precautions: contact precautions maintained  Taken 12/02/2020 2000 by Oletta Cohn, RN  Infection Management: aseptic technique maintained  Isolation Precautions: contact precautions maintained     Problem: Oral Intake Inadequate (Cystic Fibrosis)  Goal: Optimal Nutrition Intake  12/03/2020 0359 by Oletta Cohn, RN  Outcome: Progressing  12/03/2020 0304 by Oletta Cohn, RN  Outcome: Progressing     Problem: Malabsorption (Cystic Fibrosis)  Goal: Optimal Bowel Elimination  12/03/2020 0359 by Oletta Cohn, RN  Outcome: Progressing  12/03/2020 0304 by Oletta Cohn, RN  Outcome: Progressing     Problem: Respiratory Compromise (Cystic Fibrosis)  Goal: Effective Oxygenation and Ventilation  12/03/2020 0359 by Oletta Cohn, RN  Outcome: Progressing  12/03/2020 0304 by Oletta Cohn, RN  Outcome: Progressing     Problem: Airway Clearance Ineffective  Goal: Effective Airway Clearance  12/03/2020 0359 by Oletta Cohn, RN  Outcome: Progressing  12/03/2020 0304 by Oletta Cohn, RN  Outcome: Progressing  Intervention: Promote Airway Secretion Clearance  Recent Flowsheet Documentation  Taken 12/02/2020 2000 by Oletta Cohn, RN  Activity Management:   activity adjusted per tolerance   activity encouraged     Problem: Diabetes Comorbidity  Goal: Blood Glucose Level Within Targeted Range  12/03/2020 0359 by Oletta Cohn, RN  Outcome: Progressing  12/03/2020 0304 by Oletta Cohn, RN  Outcome: Progressing     Problem: Hypertension Comorbidity  Goal: Blood Pressure in Desired Range  12/03/2020 0359 by Oletta Cohn, RN  Outcome: Progressing  12/03/2020 0304 by Oletta Cohn, RN  Outcome: Progressing

## 2020-12-03 NOTE — Unmapped (Signed)
No change in patient condition. Continue iv abx per portacath.  Blood sugar is well controlled.    Problem: Adult Inpatient Plan of Care  Goal: Plan of Care Review  Outcome: Progressing  Goal: Absence of Hospital-Acquired Illness or Injury  Outcome: Progressing  Intervention: Identify and Manage Fall Risk  Recent Flowsheet Documentation  Taken 12/02/2020 0800 by Juanda Chance, RN  Safety Interventions:   low bed   nonskid shoes/slippers when out of bed  Intervention: Prevent Infection  Recent Flowsheet Documentation  Taken 12/02/2020 0800 by Juanda Chance, RN  Infection Prevention:   hand hygiene promoted   personal protective equipment utilized     Problem: Infection  Goal: Absence of Infection Signs and Symptoms  Intervention: Prevent or Manage Infection  Recent Flowsheet Documentation  Taken 12/02/2020 0800 by Juanda Chance, RN  Isolation Precautions: contact precautions maintained

## 2020-12-03 NOTE — Unmapped (Signed)
PULMONARY CONSULT  NOTE      Patient: Christian Young(1986-02-27)  Reason for consultation: Christian Young is a 35 y.o. Young who is seen in consultation at the request of Lillia Carmel, MD for comprehensive evaluation of CF exacerbation.        Assessment and Recommendations:      Principal Problem:    Cystic fibrosis with pulmonary exacerbation (CMS-HCC)  Active Problems:    Essential hypertension    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)    Pancreatic insufficiency due to cystic fibrosis (CMS-HCC)    Chronic deep vein thrombosis (DVT) of lower extremity (CMS-HCC)  Resolved Problems:    * No resolved hospital problems. *    Christian Young is a 35 y.o. Young with PMHx of CF who presented to [facility] with Cystic fibrosis with pulmonary exacerbation (CMS-HCC).  ??  Acute exacerbation of CF bronchiectasis: Has grown MSSA and 2 variants of PSAS (one pan resistant and one with intermediate resistance), unable to produce sputum sample here. Spirometry 4/21 with FEV1 at baseline.  - Zosyn 4.5 mg q6h, tobramycin IV, pharmacy to dose, does BID dosing, Mg with BMP while on Tobramycin (twice weekly is fine).  - Plan for at least 14 days of antibiotics.  Will likely need to be completed as an inpatient (has done both here/home, feels current supports make it difficult to complete treatment at home). He should be dosed through 4/28, discharge 4/29.  - Continue Trikafta (non-formulary), Breo (formulary alternative for Symbicort) and Pulmozyme  - Airway clearance with albuterol TID, 7% hypertonic saline, chest vest, and aerobika  - INR elevated, s/p vit K daily for 5 days, though suspecting influenced by Xarelto use. Now twice weekly while on IV abx.     Nausea: Resolving,  Not typical symptom for patient while on IV antibiotics but now persistent here. Lab evaluation and exam unremarkable.  - Continue PRN Zofran or Compazine (right now he prefers latter).  - Continue pancreatic enzymes with Creon.  - Continue to ensure having at least one bowel movement/day; admission earlier this year locally for ?pSBO/ileus, possible DIOS, daily Miralax (patient can refuse if not needing).      We appreciate the opportunity to assist in the care of this patient.  Please page [phone] with any questions.    Darnelle Bos, MD    Subjective:      History of Present Illness:  [name] is a 35 y.o. Young admitted for CF exacerbation.  Has had recent pleuritic chest pain and fever.  Recommended by primary CF doc to present for admission and IV Abx.    Interval history 12/03/20: Patient feeling a lot better today. No complaints this AM    Past medical, past surgical, family, and social histories and allergies reviewed from initial admission note and otherwise in Epic.     Review of Systems: A comprehensive review of systems was performed and was negative except as above in HPI.     Objective:      Physical Exam:  Vitals:    12/02/20 0630 12/02/20 1135 12/02/20 2023 12/03/20 0547   BP: 111/81 124/86 124/85 112/78   Pulse: 74 85 85 66   Resp: 18 18 16 18    Temp: 36.5 ??C 36.4 ??C 37 ??C 36.7 ??C   TempSrc: Oral Oral Oral Oral   SpO2: 98% 100% 100% 98%   Weight:       Height:  Gen: Patient awake, alert, oriented to time place and person, no pallor, icterus,  cyanosis or clubbing.  Eyes: Pupils equal round and reacting light bilaterally  Head neck ENT: No JVD, no lymphadenopathy-cervical or supraclavicular, no thyromegaly, moist mucosa, normal oropharynx  CVS: S1-S2 heard normally, no murmurs appreciated, no rubs or gallops  Respiratory: Air entry equal bilaterally without any crackles or wheeze  Abdomen: Soft, nontender, nondistended, no organomegaly, bowel sounds present  Neuro: Nonfocal neurological exam, no sensory deficits, cranial nerves grossly intact  Skin extremities: No rash, no pedal edema    Malnutrition Assessment by RD:          Diagnostic Review:   All labs and images were personally reviewed.

## 2020-12-03 NOTE — Unmapped (Signed)
Patient alert and oriented x 4 and in NAD.  Medications given as ordered.  Tobramycin peak and random levels drawn today.  Breathing treatments administered by RT.      Problem: Adult Inpatient Plan of Care  Goal: Plan of Care Review  Outcome: Progressing  Goal: Patient-Specific Goal (Individualized)  Outcome: Progressing  Goal: Absence of Hospital-Acquired Illness or Injury  Outcome: Progressing  Intervention: Identify and Manage Fall Risk  Recent Flowsheet Documentation  Taken 12/03/2020 0800 by Hansel Starling, RN  Safety Interventions:   environmental modification   fall reduction program maintained   infection management   isolation precautions   lighting adjusted for tasks/safety   nonskid shoes/slippers when out of bed  Intervention: Prevent Skin Injury  Recent Flowsheet Documentation  Taken 12/03/2020 0800 by Hansel Starling, RN  Skin Protection:   adhesive use limited   cleansing with dimethicone incontinence wipes   incontinence pads utilized  Intervention: Prevent and Manage VTE (Venous Thromboembolism) Risk  Recent Flowsheet Documentation  Taken 12/03/2020 0800 by Hansel Starling, RN  Activity Management: activity adjusted per tolerance  Intervention: Prevent Infection  Recent Flowsheet Documentation  Taken 12/03/2020 0800 by Hansel Starling, RN  Infection Prevention:   cohorting utilized   environmental surveillance performed   equipment surfaces disinfected   hand hygiene promoted   personal protective equipment utilized   rest/sleep promoted   single patient room provided   visitors restricted/screened  Goal: Optimal Comfort and Wellbeing  Outcome: Progressing  Goal: Readiness for Transition of Care  Outcome: Progressing  Goal: Rounds/Family Conference  Outcome: Progressing     Problem: Infection  Goal: Absence of Infection Signs and Symptoms  Outcome: Progressing  Intervention: Prevent or Manage Infection  Recent Flowsheet Documentation  Taken 12/03/2020 0800 by Hansel Starling, RN  Infection Management: aseptic technique maintained  Isolation Precautions: contact precautions maintained     Problem: Adjustment to Illness (Cystic Fibrosis)  Goal: Optimal Coping  Outcome: Progressing     Problem: Infection (Cystic Fibrosis)  Goal: Absence of Infection Signs and Symptoms  Outcome: Progressing  Intervention: Manage Infection and Prevent Transmission  Recent Flowsheet Documentation  Taken 12/03/2020 0800 by Hansel Starling, RN  Infection Management: aseptic technique maintained  Isolation Precautions: contact precautions maintained     Problem: Oral Intake Inadequate (Cystic Fibrosis)  Goal: Optimal Nutrition Intake  Outcome: Progressing     Problem: Malabsorption (Cystic Fibrosis)  Goal: Optimal Bowel Elimination  Outcome: Progressing     Problem: Respiratory Compromise (Cystic Fibrosis)  Goal: Effective Oxygenation and Ventilation  Outcome: Progressing     Problem: Airway Clearance Ineffective  Goal: Effective Airway Clearance  Outcome: Progressing  Intervention: Promote Airway Secretion Clearance  Recent Flowsheet Documentation  Taken 12/03/2020 0800 by Hansel Starling, RN  Activity Management: activity adjusted per tolerance     Problem: Diabetes Comorbidity  Goal: Blood Glucose Level Within Targeted Range  Outcome: Progressing     Problem: Hypertension Comorbidity  Goal: Blood Pressure in Desired Range  Outcome: Progressing

## 2020-12-03 NOTE — Unmapped (Addendum)
Hospitalist Daily Progress Note     LOS: 9 days       Assessment/Plan:  Principal Problem:    Cystic fibrosis with pulmonary exacerbation (CMS-HCC)  Active Problems:    Essential hypertension    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)    Pancreatic insufficiency due to cystic fibrosis (CMS-HCC)    Chronic deep vein thrombosis (DVT) of lower extremity (CMS-HCC)  Resolved Problems:    * No resolved hospital problems. *       Christian Young is a 35 y.o. male with PMHx of CF who presented to [facility] with Cystic fibrosis with pulmonary exacerbation.    Acute exacerbation of CF bronchiectasis: Admitted for 14d IV abx. Hx MSSA and 2 variants of PSAS (one pan resistant and one with intermediate resistance). AFB smear negative, blood cultures negative. Pt unable to produce specimen for new culture. MRSA screen negative. Note: patient has had cefepime allergy and developed AIN from Zosyn in past. PFTs 12/01/20 unchanged from prior.  - Pulmonology consult following, input appreciated.   - Zosyn 4.5 mg q6h x14d, through 4/28  - Tobramycin??IV, pharmacy to dose  - monitor CMP, Mg, CBC daily  - Continue Trikafta (non-form), Breo (for Symbicort) and Pulmozyme  - Airway clearance with albuterol TID, 7% hypertonic saline, chest vest, and aerobika  - INR elevated 1.45--> 1.8 despite vitamin K daily for 5 days, likely r/t Xarelto. Resume vit K dose twice weekly while on IV abx.     Nausea likely r/t abx, managed w/ zofran and compazine.  - attempt switch to PO zofran scheduled, continue prn IV compazine    Diabetes mellitus related to cystic fibrosis: Blood sugars intermittently elevated.  - Lantus 50 units nightly, sliding scale  - high-calorie high-protein diet.     Hx possible pSBO/ileus vs DIOS. Miralax TID, senna. Encourage daily BM goal  Pancreatic insufficiency due to CF: Creon 8/meals, 4/snacks. ADEK vitamins  Hx DVT: continue Xarelto 20. Provide vitamin K twice weekly while on IV abx.   Mild hypokalemia, hypomagnesium: Replete PO/IV and monitor daily    PPX: rivaroxaban, ppi + h2blk  Disposition: Inpatient 14 days of intravenous antibiotics (through 4/28)  - DC anticipated 4/29, pt won't be able to get into house until 4/29.   Primary pulmonologist knows home/wife situation and supports plan for 4/29 DC    Please page the [facility]) pager at [phone] with questions.      Subjective:   Doing well overall, no issues or complaints. Agreeable to trying po zofran instead of IV antiemetics for nausea  Yesterday: oxycodone x2, IV zofran x2 + IV compazine x1 for N  4/22: BG 112-159, SSI 2  AM BG 77  BM yesterday per pt (not recorded)  Discussing daily w/ Pulm consult    Objective:     Vital signs in last 24 hours:  Temp:  [36.4 ??C (97.5 ??F)-37 ??C (98.6 ??F)] 36.7 ??C (98.1 ??F)  Heart Rate:  [66-85] 66  Resp:  [16-18] 18  BP: (112-124)/(78-86) 112/78  MAP (mmHg):  [89-97] 89  SpO2:  [98 %-100 %] 98 %    Labs reviewed and significant for:  4/23  CBC, CMP unremarkable    Physical Exam  General: Sitting in recliner playing video games, alert, conversant  ENT: MMM  CV: RRR, no murmurs  Pulm: CTAB, normal WOB  Abd: NABS, soft, non-tender, non-distended  Ext: No edema.  Neuro: Speech fluent, cognition intact  Psych: normal mood and affect  PORT in place L upper chest

## 2020-12-04 LAB — CBC W/ AUTO DIFF
BASOPHILS ABSOLUTE COUNT: 0.1 10*9/L (ref 0.0–0.1)
BASOPHILS RELATIVE PERCENT: 1.3 %
EOSINOPHILS ABSOLUTE COUNT: 0.3 10*9/L (ref 0.0–0.5)
EOSINOPHILS RELATIVE PERCENT: 5.9 %
HEMATOCRIT: 37.2 % — ABNORMAL LOW (ref 39.0–48.0)
HEMOGLOBIN: 12.8 g/dL — ABNORMAL LOW (ref 12.9–16.5)
LYMPHOCYTES ABSOLUTE COUNT: 1.1 10*9/L (ref 1.1–3.6)
LYMPHOCYTES RELATIVE PERCENT: 22.2 %
MEAN CORPUSCULAR HEMOGLOBIN CONC: 34.4 g/dL (ref 32.0–36.0)
MEAN CORPUSCULAR HEMOGLOBIN: 29.4 pg (ref 25.9–32.4)
MEAN CORPUSCULAR VOLUME: 85.3 fL (ref 77.6–95.7)
MEAN PLATELET VOLUME: 7.2 fL (ref 6.8–10.7)
MONOCYTES ABSOLUTE COUNT: 0.7 10*9/L (ref 0.3–0.8)
MONOCYTES RELATIVE PERCENT: 14.1 %
NEUTROPHILS ABSOLUTE COUNT: 2.7 10*9/L (ref 1.8–7.8)
NEUTROPHILS RELATIVE PERCENT: 56.5 %
NUCLEATED RED BLOOD CELLS: 0 /100{WBCs} (ref <=4)
PLATELET COUNT: 273 10*9/L (ref 150–450)
RED BLOOD CELL COUNT: 4.36 10*12/L (ref 4.26–5.60)
RED CELL DISTRIBUTION WIDTH: 14.3 % (ref 12.2–15.2)
WBC ADJUSTED: 4.8 10*9/L (ref 3.6–11.2)

## 2020-12-04 LAB — COMPREHENSIVE METABOLIC PANEL
ALBUMIN: 3.8 g/dL (ref 3.4–5.0)
ALKALINE PHOSPHATASE: 57 U/L (ref 46–116)
ALT (SGPT): 31 U/L (ref 10–49)
ANION GAP: 8 mmol/L (ref 5–14)
AST (SGOT): 19 U/L (ref <=34)
BILIRUBIN TOTAL: 0.6 mg/dL (ref 0.3–1.2)
BLOOD UREA NITROGEN: 15 mg/dL (ref 9–23)
BUN / CREAT RATIO: 19
CALCIUM: 8.9 mg/dL (ref 8.7–10.4)
CHLORIDE: 100 mmol/L (ref 98–107)
CO2: 31.1 mmol/L — ABNORMAL HIGH (ref 20.0–31.0)
CREATININE: 0.8 mg/dL
EGFR CKD-EPI AA MALE: >90 mL/min/{1.73_m2} (ref >=60)
EGFR CKD-EPI NON-AA MALE: >90 mL/min/{1.73_m2} (ref >=60)
GLUCOSE RANDOM: 80 mg/dL (ref 70–179)
POTASSIUM: 4 mmol/L (ref 3.4–4.8)
PROTEIN TOTAL: 7 g/dL (ref 5.7–8.2)
SODIUM: 139 mmol/L (ref 135–145)

## 2020-12-04 LAB — MAGNESIUM: MAGNESIUM: 1.8 mg/dL (ref 1.6–2.6)

## 2020-12-04 MED ADMIN — pediatric multivitamin-vit D3 1,500 unit-vit K 800 mcg (MVW COMPLETE FORMULATION) capsule: 1 | ORAL | @ 14:00:00

## 2020-12-04 MED ADMIN — magnesium oxide (MAG-OX) tablet 400 mg: 400 mg | ORAL | @ 02:00:00

## 2020-12-04 MED ADMIN — cetirizine (ZyrTEC) tablet 10 mg: 10 mg | ORAL | @ 14:00:00

## 2020-12-04 MED ADMIN — traZODone (DESYREL) tablet 100 mg: 100 mg | ORAL | @ 02:00:00

## 2020-12-04 MED ADMIN — acetaminophen (TYLENOL) tablet 650 mg: 650 mg | ORAL | @ 12:00:00

## 2020-12-04 MED ADMIN — pramipexole (MIRAPEX) tablet 0.5 mg: .5 mg | ORAL | @ 22:00:00

## 2020-12-04 MED ADMIN — acetaminophen (TYLENOL) tablet 650 mg: 650 mg | ORAL | @ 22:00:00

## 2020-12-04 MED ADMIN — rivaroxaban (XARELTO) tablet 20 mg: 20 mg | ORAL | @ 22:00:00

## 2020-12-04 MED ADMIN — dornase alfa (PULMOZYME) 1 mg/mL solution 2.5 mg: 2.5 mg | RESPIRATORY_TRACT | @ 01:00:00

## 2020-12-04 MED ADMIN — polyethylene glycol (MIRALAX) packet 17 g: 17 g | ORAL | @ 17:00:00

## 2020-12-04 MED ADMIN — famotidine (PEPCID) tablet 20 mg: 20 mg | ORAL | @ 14:00:00

## 2020-12-04 MED ADMIN — magnesium oxide (MAG-OX) tablet 400 mg: 400 mg | ORAL | @ 14:00:00

## 2020-12-04 MED ADMIN — sodium chloride 7% nebulizer solution 4 mL: 4 mL | RESPIRATORY_TRACT | @ 01:00:00

## 2020-12-04 MED ADMIN — amitriptyline (ELAVIL) tablet 100 mg: 100 mg | ORAL | @ 02:00:00

## 2020-12-04 MED ADMIN — melatonin tablet 10.5 mg: 10 mg | ORAL | @ 02:00:00

## 2020-12-04 MED ADMIN — insulin glargine (LANTUS) injection 50 Units: 50 [IU] | SUBCUTANEOUS | @ 02:00:00

## 2020-12-04 MED ADMIN — tobramycin (NEBCIN) 280 mg in sodium chloride (NS) 0.9 % 100 mL IVPB: 280 mg | INTRAVENOUS | @ 02:00:00 | Stop: 2020-12-07

## 2020-12-04 MED ADMIN — atorvastatin (LIPITOR) tablet 20 mg: 20 mg | ORAL | @ 02:00:00

## 2020-12-04 MED ADMIN — sodium chloride 7% nebulizer solution 4 mL: 4 mL | RESPIRATORY_TRACT | @ 12:00:00

## 2020-12-04 MED ADMIN — pancrelipase (Lip-Prot-Amyl) (CREON) 24,000-76,000 -120,000 unit delayed release capsule 192,000 units of lipase: 8 | ORAL | @ 17:00:00

## 2020-12-04 MED ADMIN — insulin lispro (HumaLOG) injection 0-12 Units: 0-12 [IU] | SUBCUTANEOUS | @ 02:00:00

## 2020-12-04 MED ADMIN — ondansetron (ZOFRAN-ODT) disintegrating tablet 4 mg: 4 mg | ORAL | @ 12:00:00

## 2020-12-04 MED ADMIN — polyethylene glycol (MIRALAX) packet 17 g: 17 g | ORAL | @ 02:00:00

## 2020-12-04 MED ADMIN — FLUoxetine (PROzac) capsule 60 mg: 60 mg | ORAL | @ 14:00:00

## 2020-12-04 MED ADMIN — pancrelipase (Lip-Prot-Amyl) (CREON) 24,000-76,000 -120,000 unit delayed release capsule 192,000 units of lipase: 8 | ORAL | @ 12:00:00

## 2020-12-04 MED ADMIN — fluticasone furoate-vilanteroL (BREO ELLIPTA) 200-25 mcg/dose inhaler 1 puff: 1 | RESPIRATORY_TRACT | @ 12:00:00

## 2020-12-04 MED ADMIN — piperacillin-tazobactam (ZOSYN) IVPB (premix) 4.5 g: 4.5 g | INTRAVENOUS | @ 22:00:00 | Stop: 2020-12-09

## 2020-12-04 MED ADMIN — gabapentin (NEURONTIN) capsule 600 mg: 600 mg | ORAL | @ 12:00:00

## 2020-12-04 MED ADMIN — fluticasone propionate (FLONASE) 50 mcg/actuation nasal spray 1 spray: 1 | NASAL | @ 15:00:00

## 2020-12-04 MED ADMIN — piperacillin-tazobactam (ZOSYN) IVPB (premix) 4.5 g: 4.5 g | INTRAVENOUS | @ 17:00:00 | Stop: 2020-12-09

## 2020-12-04 MED ADMIN — pediatric multivitamin-vit D3 1,500 unit-vit K 800 mcg (MVW COMPLETE FORMULATION) capsule: 1 | ORAL | @ 02:00:00

## 2020-12-04 MED ADMIN — oxyCODONE (ROXICODONE) immediate release tablet 5 mg: 5 mg | ORAL | @ 02:00:00 | Stop: 2020-12-08

## 2020-12-04 MED ADMIN — albuterol nebulizer solution 2.5 mg: 2.5 mg | RESPIRATORY_TRACT | @ 12:00:00

## 2020-12-04 MED ADMIN — pantoprazole (PROTONIX) EC tablet 20 mg: 20 mg | ORAL | @ 14:00:00

## 2020-12-04 MED ADMIN — oxyCODONE (ROXICODONE) immediate release tablet 5 mg: 5 mg | ORAL | @ 17:00:00 | Stop: 2020-12-08

## 2020-12-04 MED ADMIN — piperacillin-tazobactam (ZOSYN) IVPB (premix) 4.5 g: 4.5 g | INTRAVENOUS | @ 11:00:00 | Stop: 2020-12-09

## 2020-12-04 MED ADMIN — piperacillin-tazobactam (ZOSYN) IVPB (premix) 4.5 g: 4.5 g | INTRAVENOUS | @ 06:00:00 | Stop: 2020-12-09

## 2020-12-04 MED ADMIN — elexacaftor-tezacaftor-ivacaft (TRIKAFTA) tablet 2 tablet **Patient Supplied**: 2 | ORAL | @ 14:00:00

## 2020-12-04 MED ADMIN — lamoTRIgine (LaMICtal) tablet 200 mg: 200 mg | ORAL | @ 14:00:00

## 2020-12-04 MED ADMIN — lisinopriL (PRINIVIL,ZESTRIL) tablet 10 mg: 10 mg | ORAL | @ 14:00:00

## 2020-12-04 MED ADMIN — albuterol nebulizer solution 2.5 mg: 2.5 mg | RESPIRATORY_TRACT | @ 01:00:00

## 2020-12-04 MED ADMIN — dornase alfa (PULMOZYME) 1 mg/mL solution 2.5 mg: 2.5 mg | RESPIRATORY_TRACT | @ 12:00:00

## 2020-12-04 MED ADMIN — pancrelipase (Lip-Prot-Amyl) (CREON) 24,000-76,000 -120,000 unit delayed release capsule 192,000 units of lipase: 8 | ORAL | @ 22:00:00

## 2020-12-04 MED ADMIN — elexacaftor-tezacaftor-ivacaft (TRIKAFTA) tablet 1 tablet: 1 | ORAL | @ 02:00:00

## 2020-12-04 MED ADMIN — tobramycin (NEBCIN) 280 mg in sodium chloride (NS) 0.9 % 100 mL IVPB: 280 mg | INTRAVENOUS | @ 14:00:00 | Stop: 2020-12-07

## 2020-12-04 MED ADMIN — senna (SENOKOT) tablet 2 tablet: 2 | ORAL | @ 02:00:00

## 2020-12-04 MED ADMIN — polyethylene glycol (MIRALAX) packet 17 g: 17 g | ORAL | @ 14:00:00

## 2020-12-04 NOTE — Unmapped (Signed)
PULMONARY CONSULT  NOTE      Patient: Christian Young(1985)  Reason for consultation: Christian Young is a 35 y.o. male who is seen in consultation at the request of Kathryne Hitch, MD for comprehensive evaluation of CF exacerbation.        Assessment and Recommendations:      Principal Problem:    Cystic fibrosis with pulmonary exacerbation (CMS-HCC)  Active Problems:    Essential hypertension    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)    Pancreatic insufficiency due to cystic fibrosis (CMS-HCC)    Chronic deep vein thrombosis (DVT) of lower extremity (CMS-HCC)  Resolved Problems:    * No resolved hospital problems. *    Christian Young is a 35 y.o. male with PMHx of CF who presented to [facility] with Cystic fibrosis with pulmonary exacerbation (CMS-HCC).  ??  Acute exacerbation of CF bronchiectasis: Has grown MSSA and 2 variants of PSAS (one pan resistant and one with intermediate resistance), unable to produce sputum sample here. Spirometry 4/21 with FEV1 at baseline.  - Zosyn 4.5 mg q6h, tobramycin IV, pharmacy to dose, does BID dosing, Mg with BMP while on Tobramycin (twice weekly is fine).  - Plan for at least 14 days of antibiotics.  Will need to be completed as an inpatient (has done both here/home, feels current supports make it difficult to complete treatment at home). He should be dosed through 4/28, discharge 4/29.  - Continue Trikafta (non-formulary), Breo (formulary alternative for Symbicort) and Pulmozyme  - Airway clearance with albuterol TID, 7% hypertonic saline, chest vest, and aerobika  - INR elevated, s/p vit K daily for 5 days, though suspecting influenced by Xarelto use. Now twice weekly while on IV abx.     Nausea: Resolved  Not typical symptom for patient while on IV antibiotics but now persistent here. Lab evaluation and exam unremarkable.  - Continue PRN Zofran or Compazine (right now he prefers latter).  - Continue pancreatic enzymes with Creon.  - Continue to ensure having at least one bowel movement/day; admission earlier this year locally for ?pSBO/ileus, possible DIOS, daily Miralax (patient can refuse if not needing).      We appreciate the opportunity to assist in the care of this patient.  Please page [phone] with any questions.    Darnelle Bos, MD    Subjective:      History of Present Illness:  Christian Young is a 35 y.o. male admitted for CF exacerbation.  Has had recent pleuritic chest pain and fever.  Recommended by primary CF doc to present for admission and IV Abx.    Interval history 12/04/20: Doing well, no complaints, slept ok, no nausea    Past medical, past surgical, family, and social histories and allergies reviewed from initial admission note and otherwise in Epic.     Review of Systems: A comprehensive review of systems was performed and was negative except as above in HPI.     Objective:      Physical Exam:  Vitals:    12/03/20 0547 12/03/20 1143 12/03/20 2021 12/03/20 2123   BP: 112/78 124/80 134/87    Pulse: 66 86 86 84   Resp: 18 18 18 16    Temp: 36.7 ??C 36.3 ??C 37 ??C    TempSrc: Oral Oral Oral    SpO2: 98% 98% 96% 96%   Weight:       Height:           Gen: Patient awake,  alert, oriented to time place and person, no pallor, icterus,  cyanosis or clubbing.  Eyes: Pupils equal round and reacting light bilaterally  Head neck ENT: No JVD, no lymphadenopathy-cervical or supraclavicular, no thyromegaly, moist mucosa, normal oropharynx  CVS: S1-S2 heard normally, no murmurs appreciated, no rubs or gallops  Respiratory: Air entry equal bilaterally without any crackles or wheeze  Abdomen: Soft, nontender, nondistended, no organomegaly, bowel sounds present  Neuro: Nonfocal neurological exam, no sensory deficits, cranial nerves grossly intact  Skin extremities: No rash, no pedal edema    Malnutrition Assessment by RD:          Diagnostic Review:   All labs and images were personally reviewed.

## 2020-12-04 NOTE — Unmapped (Signed)
Aminoglycoside Therapeutic Monitoring Pharmacy Note    Christian Young is a 35 y.o. male continuing tobramycin. Date of therapy initiation: 11/24/20    Indication: CF exacerbation    Prior Dosing Information: Current regimen 280 mg IV every 12 hours      Goals:  Therapeutic Drug Levels  ?? Trough level: tobramycin <1 mg/L  ?? Peak level: tobramycin 10-14 mg/L    Additional Clinical Monitoring/Outcomes  Renal function, volume status (intake and output)    Results:   ?? First random level=  6.4 mg/L. Drawn 1.75 hours after dose finished infusing.  Extrapolated PEAK level = 10.3 mg/L  ?? Second random level = 1.1mg /L.  Drawn 6.36 hours after 1st random level.  Extrapolated TROUGH level = 0.43 mg/L    Wt Readings from Last 1 Encounters:   11/24/20 (!) 101.3 kg (223 lb 4.8 oz)     Lab Results   Component Value Date    CREATININE 0.79 12/03/2020       Pharmacokinetic Considerations and Significant Drug Interactions:  ??? Adult (calculated on 12/03/20): Vd = 26.3 L, ke = 0.277 hr-1  ??? Concurrent nephrotoxic meds: Not applicable    Assessment/Plan:  Levels are within goal range    Recommendation(s)  ??? Continue current regimen of 280mg  IV q 12 hours  ??? Estimated peak and trough on recommended regimen: peak = 10.3 mg/L, trough = 0.43 mg/L    Follow-up  ??? Level due: in 3-5 days  ??? A pharmacist will continue to monitor and order levels as appropriate    Please page service pharmacist with questions/clarifications.    Truddie Coco, Pharm D, BCPS, BCGP

## 2020-12-04 NOTE — Unmapped (Signed)
Patient is alert and oriented x 4, self care. Vss, afebrile, no s/s of infection. Patient c/o back and chest pain oxycodone given for pain management. Left chest wall port C, D, I. Continues on IV abx and scheduled NEB treatments. No concerns. will continue with the plan of care.   Problem: Adult Inpatient Plan of Care  Goal: Plan of Care Review  Outcome: Progressing  Goal: Patient-Specific Goal (Individualized)  Outcome: Progressing  Goal: Absence of Hospital-Acquired Illness or Injury  Outcome: Progressing  Intervention: Identify and Manage Fall Risk  Recent Flowsheet Documentation  Taken 12/03/2020 2000 by Mayo Ao, RN  Safety Interventions:   nonskid shoes/slippers when out of bed   isolation precautions  Intervention: Prevent and Manage VTE (Venous Thromboembolism) Risk  Recent Flowsheet Documentation  Taken 12/03/2020 2000 by Andrey Farmer Marlow-Freeman, RN  Activity Management: activity adjusted per tolerance  Goal: Optimal Comfort and Wellbeing  Outcome: Progressing  Goal: Readiness for Transition of Care  Outcome: Progressing  Goal: Rounds/Family Conference  Outcome: Progressing     Problem: Infection  Goal: Absence of Infection Signs and Symptoms  Outcome: Progressing  Intervention: Prevent or Manage Infection  Recent Flowsheet Documentation  Taken 12/03/2020 2000 by Andrey Farmer Marlow-Freeman, RN  Isolation Precautions: contact precautions maintained     Problem: Adjustment to Illness (Cystic Fibrosis)  Goal: Optimal Coping  Outcome: Progressing     Problem: Infection (Cystic Fibrosis)  Goal: Absence of Infection Signs and Symptoms  Outcome: Progressing  Intervention: Manage Infection and Prevent Transmission  Recent Flowsheet Documentation  Taken 12/03/2020 2000 by Andrey Farmer Marlow-Freeman, RN  Isolation Precautions: contact precautions maintained     Problem: Oral Intake Inadequate (Cystic Fibrosis)  Goal: Optimal Nutrition Intake  Outcome: Progressing     Problem: Malabsorption (Cystic Fibrosis)  Goal: Optimal Bowel Elimination  Outcome: Progressing     Problem: Respiratory Compromise (Cystic Fibrosis)  Goal: Effective Oxygenation and Ventilation  Outcome: Progressing     Problem: Airway Clearance Ineffective  Goal: Effective Airway Clearance  Outcome: Progressing  Intervention: Promote Airway Secretion Clearance  Recent Flowsheet Documentation  Taken 12/03/2020 2000 by Andrey Farmer Marlow-Freeman, RN  Activity Management: activity adjusted per tolerance     Problem: Diabetes Comorbidity  Goal: Blood Glucose Level Within Targeted Range  Outcome: Progressing     Problem: Hypertension Comorbidity  Goal: Blood Pressure in Desired Range  Outcome: Progressing

## 2020-12-04 NOTE — Unmapped (Signed)
Problem: Adult Inpatient Plan of Care  Goal: Plan of Care Review  Outcome: Progressing     Problem: Adjustment to Illness (Cystic Fibrosis)  Goal: Optimal Coping  Outcome: Progressing     Problem: Airway Clearance Ineffective  Goal: Effective Airway Clearance  Outcome: Progressing   Pt remains on RA, Inhaled medications given as ordered, encouraged deep breathing and coughing, NPC, BBS Clear, Home vest used for mucus clearance.

## 2020-12-04 NOTE — Unmapped (Signed)
Hospitalist Daily Progress Note     LOS: 10 days       Assessment/Plan:  Principal Problem:    Cystic fibrosis with pulmonary exacerbation (CMS-HCC)  Active Problems:    Essential hypertension    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)    Pancreatic insufficiency due to cystic fibrosis (CMS-HCC)    Chronic deep vein thrombosis (DVT) of lower extremity (CMS-HCC)  Resolved Problems:    * No resolved hospital problems. *       Christian Young is a 35 y.o. male with PMHx of CF who presented to [facility] with Cystic fibrosis with pulmonary exacerbation.    Acute exacerbation of CF bronchiectasis: Admitted for 14d IV abx. Hx MSSA and 2 variants of PSAS (one pan resistant and one with intermediate resistance). AFB smear negative, blood cultures negative. Pt unable to produce specimen for new culture. MRSA screen negative. PFTs 12/01/20 unchanged from prior. Clinically improving with IV antibiotic course.  - Pulmonology following  - Continue with IV Zosyn and Tobramycin, completing course on 4/28  - Monitor CMP, Mg, CBC daily  - Continue Trikafta (non-form), Breo (for Symbicort) and Pulmozyme  - Airway clearance with albuterol TID, 7% hypertonic saline, chest vest, and Aerobika  - Twice weekly VitK while on IV antibiotics    Nausea: Much improved with scheduled PO Zofran with antibiotics. Continue with PRN Compazine for breakthrough    CF Related Diabetes Mellitus: Blood sugars intermittently elevated.  - Lantus 50 units nightly, sliding scale    History of pSBO/ileus vs DIOS. Miralax TID, senna. Encourage daily BM goal    Pancreatic insufficiency due to CF: Creon 8/meals, 4/snacks. ADEK vitamins    Hx DVT: continue Xarelto 20. Provide vitamin K twice weekly while on IV abx.     FEN/GI:  - High protein/high calorie diet  - No IVF  - Replete lytes PRN    PPx: Home rivaroxaban    Dispo: Will complete a 14 day course of antibiotics on 4/28, discharge planned for 4/29    Please page the [facility]) pager at [phone] with questions.    Subjective:   No acute events overnight. Feeling a lot better, not having any nausea today. Breathing much more comfortably, no significant chest pain. Feels good about the plan for discharge Friday. No concerns at this time.     Objective:   Vital signs in last 24 hours:  Temp:  [36.3 ??C (97.3 ??F)-37 ??C (98.6 ??F)] 37 ??C (98.6 ??F)  Heart Rate:  [84-86] 84  Resp:  [16-18] 16  BP: (124-134)/(80-87) 134/87  MAP (mmHg):  [93-101] 101  SpO2:  [96 %-98 %] 96 %    Labs personally reviewed    Gen: Sitting up in the chair, well appearing. Awake and alert. NAD. Non-toxic.  HEENT: EOMI, sclera anicteric. MMM.  Chest: Port in L chest accessed, clean overlying dressing.  Resp: Normal WOB. Equal air movement bilaterally. CTAB  CV: RRR, normal s1s2, no m/g/r.   Abd: Soft, NTND, normoactive BS.  Ext: WWP. No edema.

## 2020-12-04 NOTE — Unmapped (Signed)
Patient alert and oriented x 4 and in NAD.  Medications given as ordered.  Left chest port accessed and infusing without incident.  Contact isolation maintained for MDRO.  Breathing treatments administered by RT.      Problem: Adult Inpatient Plan of Care  Goal: Plan of Care Review  Outcome: Progressing  Goal: Patient-Specific Goal (Individualized)  Outcome: Progressing  Goal: Absence of Hospital-Acquired Illness or Injury  Outcome: Progressing  Intervention: Identify and Manage Fall Risk  Recent Flowsheet Documentation  Taken 12/04/2020 0800 by Hansel Starling, RN  Safety Interventions:   environmental modification   fall reduction program maintained   infection management   isolation precautions   lighting adjusted for tasks/safety   nonskid shoes/slippers when out of bed  Intervention: Prevent Skin Injury  Recent Flowsheet Documentation  Taken 12/04/2020 0800 by Hansel Starling, RN  Skin Protection:   adhesive use limited   cleansing with dimethicone incontinence wipes   incontinence pads utilized  Intervention: Prevent and Manage VTE (Venous Thromboembolism) Risk  Recent Flowsheet Documentation  Taken 12/04/2020 0800 by Hansel Starling, RN  Activity Management: activity adjusted per tolerance  VTE Prevention/Management:   ambulation promoted   anticoagulant therapy   fluids promoted  Intervention: Prevent Infection  Recent Flowsheet Documentation  Taken 12/04/2020 0800 by Hansel Starling, RN  Infection Prevention:   cohorting utilized   environmental surveillance performed   equipment surfaces disinfected   hand hygiene promoted   personal protective equipment utilized   rest/sleep promoted   single patient room provided   visitors restricted/screened  Goal: Optimal Comfort and Wellbeing  Outcome: Progressing  Goal: Readiness for Transition of Care  Outcome: Progressing  Goal: Rounds/Family Conference  Outcome: Progressing     Problem: Infection  Goal: Absence of Infection Signs and Symptoms  Outcome: Progressing  Intervention: Prevent or Manage Infection  Recent Flowsheet Documentation  Taken 12/04/2020 0800 by Hansel Starling, RN  Infection Management: aseptic technique maintained  Isolation Precautions: contact precautions maintained     Problem: Adjustment to Illness (Cystic Fibrosis)  Goal: Optimal Coping  Outcome: Progressing     Problem: Infection (Cystic Fibrosis)  Goal: Absence of Infection Signs and Symptoms  Outcome: Progressing  Intervention: Manage Infection and Prevent Transmission  Recent Flowsheet Documentation  Taken 12/04/2020 0800 by Hansel Starling, RN  Infection Management: aseptic technique maintained  Isolation Precautions: contact precautions maintained     Problem: Oral Intake Inadequate (Cystic Fibrosis)  Goal: Optimal Nutrition Intake  Outcome: Progressing     Problem: Malabsorption (Cystic Fibrosis)  Goal: Optimal Bowel Elimination  Outcome: Progressing  Intervention: Optimize Nutrient Absorption  Recent Flowsheet Documentation  Taken 12/04/2020 0800 by Hansel Starling, RN  Bowel Elimination Management:   hygiene measures promoted   relaxation techniques promoted     Problem: Respiratory Compromise (Cystic Fibrosis)  Goal: Effective Oxygenation and Ventilation  Outcome: Progressing     Problem: Airway Clearance Ineffective  Goal: Effective Airway Clearance  Outcome: Progressing  Intervention: Promote Airway Secretion Clearance  Recent Flowsheet Documentation  Taken 12/04/2020 0800 by Hansel Starling, RN  Activity Management: activity adjusted per tolerance     Problem: Diabetes Comorbidity  Goal: Blood Glucose Level Within Targeted Range  Outcome: Progressing     Problem: Hypertension Comorbidity  Goal: Blood Pressure in Desired Range  Outcome: Progressing

## 2020-12-05 LAB — COMPREHENSIVE METABOLIC PANEL
ALBUMIN: 4 g/dL (ref 3.4–5.0)
ALKALINE PHOSPHATASE: 62 U/L (ref 46–116)
ALT (SGPT): 30 U/L (ref 10–49)
ANION GAP: 6 mmol/L (ref 5–14)
AST (SGOT): 23 U/L (ref <=34)
BILIRUBIN TOTAL: 0.5 mg/dL (ref 0.3–1.2)
BLOOD UREA NITROGEN: 15 mg/dL (ref 9–23)
BUN / CREAT RATIO: 17
CALCIUM: 9.2 mg/dL (ref 8.7–10.4)
CHLORIDE: 102 mmol/L (ref 98–107)
CO2: 30.9 mmol/L (ref 20.0–31.0)
CREATININE: 0.86 mg/dL
EGFR CKD-EPI AA MALE: >90 mL/min/{1.73_m2} (ref >=60)
EGFR CKD-EPI NON-AA MALE: >90 mL/min/{1.73_m2} (ref >=60)
GLUCOSE RANDOM: 73 mg/dL (ref 70–179)
POTASSIUM: 4.2 mmol/L (ref 3.4–4.8)
PROTEIN TOTAL: 7.5 g/dL (ref 5.7–8.2)
SODIUM: 139 mmol/L (ref 135–145)

## 2020-12-05 LAB — MAGNESIUM: MAGNESIUM: 2 mg/dL (ref 1.6–2.6)

## 2020-12-05 LAB — LIPID PANEL
CHOLESTEROL/HDL RATIO SCREEN: 4.1 (ref 1.0–4.5)
CHOLESTEROL: 148 mg/dL (ref <=200)
HDL CHOLESTEROL: 36 mg/dL — ABNORMAL LOW (ref 40–60)
LDL CHOLESTEROL CALCULATED: 96 mg/dL (ref 40–99)
NON-HDL CHOLESTEROL: 112 mg/dL (ref 70–130)
TRIGLYCERIDES: 80 mg/dL (ref 0–150)
VLDL CHOLESTEROL CAL: 16 mg/dL (ref 10–50)

## 2020-12-05 LAB — CBC W/ AUTO DIFF
BASOPHILS ABSOLUTE COUNT: 0.1 10*9/L (ref 0.0–0.1)
BASOPHILS RELATIVE PERCENT: 1.3 %
EOSINOPHILS ABSOLUTE COUNT: 0.2 10*9/L (ref 0.0–0.5)
EOSINOPHILS RELATIVE PERCENT: 4.6 %
HEMATOCRIT: 39.3 % (ref 39.0–48.0)
HEMOGLOBIN: 13.3 g/dL (ref 12.9–16.5)
LYMPHOCYTES ABSOLUTE COUNT: 1 10*9/L — ABNORMAL LOW (ref 1.1–3.6)
LYMPHOCYTES RELATIVE PERCENT: 19.3 %
MEAN CORPUSCULAR HEMOGLOBIN CONC: 33.7 g/dL (ref 32.0–36.0)
MEAN CORPUSCULAR HEMOGLOBIN: 29.2 pg (ref 25.9–32.4)
MEAN CORPUSCULAR VOLUME: 86.4 fL (ref 77.6–95.7)
MEAN PLATELET VOLUME: 7.3 fL (ref 6.8–10.7)
MONOCYTES ABSOLUTE COUNT: 0.7 10*9/L (ref 0.3–0.8)
MONOCYTES RELATIVE PERCENT: 14.1 %
NEUTROPHILS ABSOLUTE COUNT: 3.1 10*9/L (ref 1.8–7.8)
NEUTROPHILS RELATIVE PERCENT: 60.7 %
NUCLEATED RED BLOOD CELLS: 0 /100{WBCs} (ref <=4)
PLATELET COUNT: 286 10*9/L (ref 150–450)
RED BLOOD CELL COUNT: 4.55 10*12/L (ref 4.26–5.60)
RED CELL DISTRIBUTION WIDTH: 14 % (ref 12.2–15.2)
WBC ADJUSTED: 5.1 10*9/L (ref 3.6–11.2)

## 2020-12-05 MED ADMIN — piperacillin-tazobactam (ZOSYN) IVPB (premix) 4.5 g: 4.5 g | INTRAVENOUS | @ 18:00:00 | Stop: 2020-12-09

## 2020-12-05 MED ADMIN — rivaroxaban (XARELTO) tablet 20 mg: 20 mg | ORAL | @ 22:00:00

## 2020-12-05 MED ADMIN — magnesium oxide (MAG-OX) tablet 400 mg: 400 mg | ORAL | @ 13:00:00

## 2020-12-05 MED ADMIN — gabapentin (NEURONTIN) capsule 600 mg: 600 mg | ORAL | @ 13:00:00

## 2020-12-05 MED ADMIN — insulin glargine (LANTUS) injection 50 Units: 50 [IU] | SUBCUTANEOUS | @ 02:00:00

## 2020-12-05 MED ADMIN — insulin lispro (HumaLOG) injection 0-12 Units: 0-12 [IU] | SUBCUTANEOUS | @ 22:00:00

## 2020-12-05 MED ADMIN — pediatric multivitamin-vit D3 1,500 unit-vit K 800 mcg (MVW COMPLETE FORMULATION) capsule: 1 | ORAL | @ 13:00:00

## 2020-12-05 MED ADMIN — FLUoxetine (PROzac) capsule 60 mg: 60 mg | ORAL | @ 13:00:00

## 2020-12-05 MED ADMIN — fluticasone furoate-vilanteroL (BREO ELLIPTA) 200-25 mcg/dose inhaler 1 puff: 1 | RESPIRATORY_TRACT | @ 13:00:00

## 2020-12-05 MED ADMIN — famotidine (PEPCID) tablet 20 mg: 20 mg | ORAL | @ 13:00:00

## 2020-12-05 MED ADMIN — albuterol nebulizer solution 2.5 mg: 2.5 mg | RESPIRATORY_TRACT | @ 14:00:00

## 2020-12-05 MED ADMIN — pantoprazole (PROTONIX) EC tablet 20 mg: 20 mg | ORAL | @ 13:00:00

## 2020-12-05 MED ADMIN — fluticasone propionate (FLONASE) 50 mcg/actuation nasal spray 1 spray: 1 | NASAL | @ 13:00:00

## 2020-12-05 MED ADMIN — sodium chloride 7% nebulizer solution 4 mL: 4 mL | RESPIRATORY_TRACT | @ 19:00:00

## 2020-12-05 MED ADMIN — senna (SENOKOT) tablet 2 tablet: 2 | ORAL | @ 02:00:00

## 2020-12-05 MED ADMIN — traZODone (DESYREL) tablet 100 mg: 100 mg | ORAL | @ 02:00:00

## 2020-12-05 MED ADMIN — piperacillin-tazobactam (ZOSYN) IVPB (premix) 4.5 g: 4.5 g | INTRAVENOUS | @ 13:00:00 | Stop: 2020-12-09

## 2020-12-05 MED ADMIN — atorvastatin (LIPITOR) tablet 20 mg: 20 mg | ORAL | @ 02:00:00

## 2020-12-05 MED ADMIN — pediatric multivitamin-vit D3 1,500 unit-vit K 800 mcg (MVW COMPLETE FORMULATION) capsule: 1 | ORAL | @ 02:00:00

## 2020-12-05 MED ADMIN — pancrelipase (Lip-Prot-Amyl) (CREON) 24,000-76,000 -120,000 unit delayed release capsule 192,000 units of lipase: 8 | ORAL | @ 17:00:00

## 2020-12-05 MED ADMIN — polyethylene glycol (MIRALAX) packet 17 g: 17 g | ORAL | @ 22:00:00

## 2020-12-05 MED ADMIN — dornase alfa (PULMOZYME) 1 mg/mL solution 2.5 mg: 2.5 mg | RESPIRATORY_TRACT | @ 14:00:00

## 2020-12-05 MED ADMIN — lisinopriL (PRINIVIL,ZESTRIL) tablet 10 mg: 10 mg | ORAL | @ 13:00:00

## 2020-12-05 MED ADMIN — pramipexole (MIRAPEX) tablet 0.5 mg: .5 mg | ORAL | @ 22:00:00

## 2020-12-05 MED ADMIN — pancrelipase (Lip-Prot-Amyl) (CREON) 24,000-76,000 -120,000 unit delayed release capsule 192,000 units of lipase: 8 | ORAL | @ 13:00:00

## 2020-12-05 MED ADMIN — magnesium oxide (MAG-OX) tablet 400 mg: 400 mg | ORAL | @ 02:00:00

## 2020-12-05 MED ADMIN — polyethylene glycol (MIRALAX) packet 17 g: 17 g | ORAL | @ 13:00:00

## 2020-12-05 MED ADMIN — ondansetron (ZOFRAN-ODT) disintegrating tablet 4 mg: 4 mg | ORAL | @ 02:00:00

## 2020-12-05 MED ADMIN — polyethylene glycol (MIRALAX) packet 17 g: 17 g | ORAL | @ 02:00:00

## 2020-12-05 MED ADMIN — melatonin tablet 10.5 mg: 10 mg | ORAL | @ 02:00:00

## 2020-12-05 MED ADMIN — lamoTRIgine (LaMICtal) tablet 200 mg: 200 mg | ORAL | @ 13:00:00

## 2020-12-05 MED ADMIN — oxyCODONE (ROXICODONE) immediate release tablet 5 mg: 5 mg | ORAL | @ 18:00:00 | Stop: 2020-12-08

## 2020-12-05 MED ADMIN — elexacaftor-tezacaftor-ivacaft (TRIKAFTA) tablet 1 tablet: 1 | ORAL | @ 02:00:00

## 2020-12-05 MED ADMIN — elexacaftor-tezacaftor-ivacaft (TRIKAFTA) tablet 2 tablet **Patient Supplied**: 2 | ORAL | @ 13:00:00

## 2020-12-05 MED ADMIN — ondansetron (ZOFRAN-ODT) disintegrating tablet 4 mg: 4 mg | ORAL | @ 13:00:00

## 2020-12-05 MED ADMIN — piperacillin-tazobactam (ZOSYN) IVPB (premix) 4.5 g: 4.5 g | INTRAVENOUS | @ 05:00:00 | Stop: 2020-12-09

## 2020-12-05 MED ADMIN — insulin lispro (HumaLOG) injection 0-12 Units: 0-12 [IU] | SUBCUTANEOUS | @ 17:00:00

## 2020-12-05 MED ADMIN — tobramycin (NEBCIN) 280 mg in sodium chloride (NS) 0.9 % 100 mL IVPB: 280 mg | INTRAVENOUS | @ 02:00:00 | Stop: 2020-12-07

## 2020-12-05 MED ADMIN — albuterol nebulizer solution 2.5 mg: 2.5 mg | RESPIRATORY_TRACT | @ 19:00:00

## 2020-12-05 MED ADMIN — sodium chloride 7% nebulizer solution 4 mL: 4 mL | RESPIRATORY_TRACT

## 2020-12-05 MED ADMIN — tobramycin (NEBCIN) 280 mg in sodium chloride (NS) 0.9 % 100 mL IVPB: 280 mg | INTRAVENOUS | @ 15:00:00 | Stop: 2020-12-07

## 2020-12-05 MED ADMIN — cetirizine (ZyrTEC) tablet 10 mg: 10 mg | ORAL | @ 13:00:00

## 2020-12-05 MED ADMIN — dornase alfa (PULMOZYME) 1 mg/mL solution 2.5 mg: 2.5 mg | RESPIRATORY_TRACT

## 2020-12-05 MED ADMIN — amitriptyline (ELAVIL) tablet 100 mg: 100 mg | ORAL | @ 02:00:00

## 2020-12-05 MED ADMIN — albuterol nebulizer solution 2.5 mg: 2.5 mg | RESPIRATORY_TRACT

## 2020-12-05 MED ADMIN — pancrelipase (Lip-Prot-Amyl) (CREON) 24,000-76,000 -120,000 unit delayed release capsule 192,000 units of lipase: 8 | ORAL | @ 22:00:00

## 2020-12-05 MED ADMIN — sodium chloride 7% nebulizer solution 4 mL: 4 mL | RESPIRATORY_TRACT | @ 14:00:00

## 2020-12-05 NOTE — Unmapped (Signed)
Hospitalist Daily Progress Note     LOS: 11 days       Assessment/Plan:  Principal Problem:    Cystic fibrosis with pulmonary exacerbation (CMS-HCC)  Active Problems:    Essential hypertension    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)    Pancreatic insufficiency due to cystic fibrosis (CMS-HCC)    Chronic deep vein thrombosis (DVT) of lower extremity (CMS-HCC)  Resolved Problems:    * No resolved hospital problems. *       Christian Young is a 35 y.o. male with PMHx of CF who presented to [facility] with Cystic fibrosis with pulmonary exacerbation.    Acute exacerbation of CF bronchiectasis: Admitted for 14d IV abx. Hx MSSA and 2 variants of PSAS (one pan resistant and one with intermediate resistance). AFB smear negative, blood cultures negative. Pt unable to produce specimen for new culture. MRSA screen negative. PFTs 12/01/20 unchanged from prior. Clinically improving with IV antibiotic course.  - Pulmonology following  - Continue with IV Zosyn and Tobramycin, completing course on 4/28  - Monitor CMP, Mg, CBC daily  - Continue Trikafta (non-form), Breo (for Symbicort) and Pulmozyme  - Airway clearance with albuterol TID, 7% hypertonic saline, chest vest, and Aerobika  - Twice weekly VitK while on IV antibiotics    Nausea: Much improved with scheduled PO Zofran with antibiotics. Continue with PRN Compazine for breakthrough    CF Related Diabetes Mellitus: Blood sugars intermittently elevated.  - Lantus 50 units nightly, sliding scale    History of pSBO/ileus vs DIOS. Miralax TID, senna. Encourage daily BM goal    Pancreatic insufficiency due to CF: Creon 8/meals, 4/snacks. ADEK vitamins    Hx DVT: continue Xarelto 20. Provide vitamin K twice weekly while on IV abx.     FEN/GI:  - High protein/high calorie diet  - No IVF  - Replete lytes PRN    PPx: Home rivaroxaban    Dispo: Will complete a 14 day course of antibiotics on 4/28, discharge planned for 4/29    Please page the [facility]) pager at [phone] with questions.    Subjective:   Didn't sleep well last night so is tired today, but otherwise feeling well. Breathing comfortably, no hemoptysis. No nausea. Appetite is good. Denies constipation.    Objective:   Vital signs in last 24 hours:  Temp:  [35.7 ??C (96.3 ??F)-37.2 ??C (99 ??F)] 35.7 ??C (96.3 ??F)  Heart Rate:  [74-89] 74  Resp:  [16-18] 16  BP: (105-123)/(55-74) 105/55  MAP (mmHg):  [72-90] 72  SpO2:  [97 %-98 %] 97 %    Labs personally reviewed    Gen: Sleeping comfortably in bed. Awake and alert. NAD. Non-toxic.  HEENT: EOMI, sclera anicteric. MMM.  Chest: Port in L chest accessed, clean overlying dressing.  Resp: Normal WOB. Equal air movement bilaterally. CTAB  CV: RRR, normal s1s2, no m/g/r.   Abd: Soft, NTND, normoactive BS.  Ext: WWP. No edema.

## 2020-12-05 NOTE — Unmapped (Signed)
Patient was compliant with all inhaled scheduled medications and airway clearance today.

## 2020-12-05 NOTE — Unmapped (Signed)
PULMONARY CONSULT  NOTE      Patient: Christian Young([birth year])  Reason for consultation: Christian Young is a 35 y.o. male who is seen in consultation at the request of Kathryne Hitch, MD for comprehensive evaluation of CF exacerbation.    Assessment and Recommendations:      Principal Problem:    Cystic fibrosis with pulmonary exacerbation (CMS-HCC)  Active Problems:    Essential hypertension    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)    Pancreatic insufficiency due to cystic fibrosis (CMS-HCC)    Chronic deep vein thrombosis (DVT) of lower extremity (CMS-HCC)  Resolved Problems:    * No resolved hospital problems. *    Christian Young is a 35 y.o. male with PMHx of CF who presented to [facility] with Cystic fibrosis with pulmonary exacerbation (CMS-HCC).  ??  Acute exacerbation of CF bronchiectasis: Has grown MSSA and 2 variants of PsA (one pan resistant and one with intermediate resistance), unable to produce sputum sample here. Spirometry 4/21 with FEV1 at baseline.  - Zosyn 4.5 mg q6h, tobramycin IV, pharmacy to dose, does BID dosing, Mg with BMP while on Tobramycin (twice weekly is fine).  - Plan for at least 14 days of antibiotics.  Will need to be completed as an inpatient (has done both here/home, feels current supports make it difficult to complete treatment at home). He should be dosed through 4/28, discharge 4/29.  - Continue Trikafta (non-formulary), Breo (formulary alternative for Symbicort) and Pulmozyme. At time of discharge, please reduce Pulmozyme frequency to once a day.  - Airway clearance with albuterol TID, 7% hypertonic saline, chest vest, and aerobika  - INR elevated, s/p vit K daily for 5 days, though suspecting influenced by Xarelto use. Now twice weekly while on IV abx.     Nausea: Resolved  Not typical symptom for patient while on IV antibiotics but now persistent here. Lab evaluation and exam unremarkable.  - Continue PRN Zofran or Compazine (right now he prefers latter).  - Continue pancreatic enzymes with Creon.  - Continue to ensure having at least one bowel movement/day; admission earlier this year locally for ?pSBO/ileus, possible DIOS, daily Miralax (patient can refuse if not needing).    CFRD:  - Please obtain fasting lipid profile while hospitalized  - Continue current insulin regimen  - Needs outpatient endocrinology appointment - overdue and HgbA1c above goal despite intentional weight loss.    We appreciate the opportunity to assist in the care of this patient.  Please page [phone] with any questions.    Viona Gilmore, MD MPH    Subjective:      History of Present Illness:  Christian Young is a 35 y.o. male admitted for CF exacerbation.  Has had recent pleuritic chest pain and fever.  Recommended by primary CF doc to present for admission and IV Abx.    Interval history 12/05/20: Feeling better.  Less cough and less dyspnea with exertion. Pain markedly improved. Has been able to use Tylenol rather than oxy. No further nausea.  Having loose stools which he attributes to bowel regimen. Airway clearance going well.    Past medical, past surgical, family, and social histories and allergies reviewed from initial admission note and otherwise in Epic.     Review of Systems: A comprehensive review of systems was performed and was negative except as above in HPI.     Objective:      Physical Exam:  Vitals:    12/04/20 2008  12/04/20 2100 12/05/20 0348 12/05/20 0907   BP:  123/72 105/55 120/73   Pulse:  79 74    Resp:  16 16    Temp:  36.1 ??C (96.9 ??F) 35.7 ??C (96.3 ??F)    TempSrc:  Oral Oral    SpO2: 98% 97% 97%    Weight:       Height:           Gen: Patient awake, alert, oriented to time place and person, no pallor, icterus,  cyanosis or clubbing.  Eyes: Pupils equal round and reacting light bilaterally  Head neck ENT: No JVD, no lymphadenopathy-cervical or supraclavicular, no thyromegaly, moist mucosa, normal oropharynx  CVS: S1-S2 heard normally, no murmurs appreciated, no rubs or gallops  Respiratory: Air entry equal bilaterally without any crackles or wheeze  Abdomen: Soft, nontender, nondistended, no organomegaly, bowel sounds present  Neuro: Nonfocal neurological exam, no sensory deficits, cranial nerves grossly intact  Skin extremities: No rash, no pedal edema    Malnutrition Assessment by RD:          Diagnostic Review:   All labs and images were personally reviewed.

## 2020-12-05 NOTE — Unmapped (Signed)
Patient stable.  States he is feeling good today.        Problem: Adult Inpatient Plan of Care  Goal: Plan of Care Review  Outcome: Progressing  Goal: Patient-Specific Goal (Individualized)  Outcome: Progressing  Goal: Absence of Hospital-Acquired Illness or Injury  Outcome: Progressing  Intervention: Identify and Manage Fall Risk  Recent Flowsheet Documentation  Taken 12/05/2020 1600 by Bess Kinds, RN  Safety Interventions: fall reduction program maintained  Taken 12/05/2020 1400 by Bess Kinds, RN  Safety Interventions: fall reduction program maintained  Taken 12/05/2020 1200 by Bess Kinds, RN  Safety Interventions: fall reduction program maintained  Taken 12/05/2020 1000 by Bess Kinds, RN  Safety Interventions: fall reduction program maintained  Taken 12/05/2020 0800 by Bess Kinds, RN  Safety Interventions: fall reduction program maintained  Intervention: Prevent and Manage VTE (Venous Thromboembolism) Risk  Recent Flowsheet Documentation  Taken 12/05/2020 1600 by Bess Kinds, RN  Activity Management: up ad lib  Taken 12/05/2020 1400 by Bess Kinds, RN  Activity Management: up ad lib  Taken 12/05/2020 1200 by Bess Kinds, RN  Activity Management: up ad lib  Taken 12/05/2020 1000 by Bess Kinds, RN  Activity Management: up ad lib  Taken 12/05/2020 0925 by Bess Kinds, RN  VTE Prevention/Management: ambulation promoted  Taken 12/05/2020 0800 by Bess Kinds, RN  Activity Management: up ad lib  Goal: Optimal Comfort and Wellbeing  Outcome: Progressing  Goal: Readiness for Transition of Care  Outcome: Progressing  Goal: Rounds/Family Conference  Outcome: Progressing     Problem: Infection  Goal: Absence of Infection Signs and Symptoms  Outcome: Progressing  Intervention: Prevent or Manage Infection  Recent Flowsheet Documentation  Taken 12/05/2020 1600 by Bess Kinds, RN  Isolation Precautions: contact precautions maintained  Taken 12/05/2020 1400 by Bess Kinds, RN  Isolation Precautions: contact precautions maintained  Taken 12/05/2020 1200 by Bess Kinds, RN  Isolation Precautions: contact precautions maintained  Taken 12/05/2020 1000 by Bess Kinds, RN  Isolation Precautions: contact precautions maintained  Taken 12/05/2020 0800 by Bess Kinds, RN  Isolation Precautions: contact precautions maintained     Problem: Adjustment to Illness (Cystic Fibrosis)  Goal: Optimal Coping  Outcome: Progressing     Problem: Infection (Cystic Fibrosis)  Goal: Absence of Infection Signs and Symptoms  Outcome: Progressing  Intervention: Manage Infection and Prevent Transmission  Recent Flowsheet Documentation  Taken 12/05/2020 1600 by Bess Kinds, RN  Isolation Precautions: contact precautions maintained  Taken 12/05/2020 1400 by Bess Kinds, RN  Isolation Precautions: contact precautions maintained  Taken 12/05/2020 1200 by Bess Kinds, RN  Isolation Precautions: contact precautions maintained  Taken 12/05/2020 1000 by Bess Kinds, RN  Isolation Precautions: contact precautions maintained  Taken 12/05/2020 0800 by Bess Kinds, RN  Isolation Precautions: contact precautions maintained     Problem: Oral Intake Inadequate (Cystic Fibrosis)  Goal: Optimal Nutrition Intake  Outcome: Progressing     Problem: Malabsorption (Cystic Fibrosis)  Goal: Optimal Bowel Elimination  Outcome: Progressing     Problem: Respiratory Compromise (Cystic Fibrosis)  Goal: Effective Oxygenation and Ventilation  Outcome: Progressing     Problem: Airway Clearance Ineffective  Goal: Effective Airway Clearance  Outcome: Progressing  Intervention: Promote Airway Secretion Clearance  Recent Flowsheet Documentation  Taken 12/05/2020 1600 by Bess Kinds, RN  Activity Management: up ad lib  Taken 12/05/2020 1400 by Bess Kinds, RN  Activity Management: up ad  lib  Taken 12/05/2020 1200 by Bess Kinds, RN  Activity Management: up ad lib  Taken 12/05/2020 1000 by Bess Kinds, RN  Activity Management: up ad lib  Taken 12/05/2020 0800 by Bess Kinds, RN  Activity Management: up ad lib     Problem: Diabetes Comorbidity  Goal: Blood Glucose Level Within Targeted Range  Outcome: Progressing     Problem: Hypertension Comorbidity  Goal: Blood Pressure in Desired Range  Outcome: Progressing

## 2020-12-05 NOTE — Unmapped (Signed)
Pt received all inhaled medications with no adverse reactions. Pt uses home chest vest for mucus clearance. Care for this pt continues.   Problem: Infection  Goal: Absence of Infection Signs and Symptoms  Outcome: Progressing     Problem: Airway Clearance Ineffective  Goal: Effective Airway Clearance  Outcome: Progressing

## 2020-12-05 NOTE — Unmapped (Signed)
Patient is alert and oriented x 4, self care. Vss, afebrile, no s/s of infection. No c/o pain. Left chest wall port C, D, I. Continues on IV abx and scheduled NEB treatments. No concerns. will continue with the plan of care.   Problem: Adult Inpatient Plan of Care  Goal: Plan of Care Review  Outcome: Progressing  Goal: Patient-Specific Goal (Individualized)  Outcome: Progressing  Goal: Absence of Hospital-Acquired Illness or Injury  Outcome: Progressing  Intervention: Identify and Manage Fall Risk  Recent Flowsheet Documentation  Taken 12/04/2020 2000 by Mayo Ao, RN  Safety Interventions: nonskid shoes/slippers when out of bed  Intervention: Prevent Skin Injury  Recent Flowsheet Documentation  Taken 12/04/2020 2000 by Andrey Farmer Marlow-Freeman, RN  Skin Protection: adhesive use limited  Intervention: Prevent and Manage VTE (Venous Thromboembolism) Risk  Recent Flowsheet Documentation  Taken 12/04/2020 2144 by Mayo Ao, RN  VTE Prevention/Management: ambulation promoted  Taken 12/04/2020 2000 by Andrey Farmer Marlow-Freeman, RN  Activity Management: up ad lib  Goal: Optimal Comfort and Wellbeing  Outcome: Progressing  Goal: Readiness for Transition of Care  Outcome: Progressing  Goal: Rounds/Family Conference  Outcome: Progressing     Problem: Infection  Goal: Absence of Infection Signs and Symptoms  Outcome: Progressing  Intervention: Prevent or Manage Infection  Recent Flowsheet Documentation  Taken 12/04/2020 2000 by Andrey Farmer Marlow-Freeman, RN  Isolation Precautions: contact precautions maintained     Problem: Adjustment to Illness (Cystic Fibrosis)  Goal: Optimal Coping  Outcome: Progressing     Problem: Infection (Cystic Fibrosis)  Goal: Absence of Infection Signs and Symptoms  Outcome: Progressing  Intervention: Manage Infection and Prevent Transmission  Recent Flowsheet Documentation  Taken 12/04/2020 2000 by Andrey Farmer Marlow-Freeman, RN  Isolation Precautions: contact precautions maintained Problem: Oral Intake Inadequate (Cystic Fibrosis)  Goal: Optimal Nutrition Intake  Outcome: Progressing     Problem: Malabsorption (Cystic Fibrosis)  Goal: Optimal Bowel Elimination  Outcome: Progressing     Problem: Respiratory Compromise (Cystic Fibrosis)  Goal: Effective Oxygenation and Ventilation  Outcome: Progressing     Problem: Airway Clearance Ineffective  Goal: Effective Airway Clearance  Outcome: Progressing  Intervention: Promote Airway Secretion Clearance  Recent Flowsheet Documentation  Taken 12/04/2020 2000 by Andrey Farmer Marlow-Freeman, RN  Activity Management: up ad lib     Problem: Diabetes Comorbidity  Goal: Blood Glucose Level Within Targeted Range  Outcome: Progressing     Problem: Hypertension Comorbidity  Goal: Blood Pressure in Desired Range  Outcome: Progressing

## 2020-12-06 LAB — CBC W/ AUTO DIFF
BASOPHILS ABSOLUTE COUNT: 0.1 10*9/L (ref 0.0–0.1)
BASOPHILS RELATIVE PERCENT: 1.3 %
EOSINOPHILS ABSOLUTE COUNT: 0.3 10*9/L (ref 0.0–0.5)
EOSINOPHILS RELATIVE PERCENT: 4.2 %
HEMATOCRIT: 40.5 % (ref 39.0–48.0)
HEMOGLOBIN: 13.8 g/dL (ref 12.9–16.5)
LYMPHOCYTES ABSOLUTE COUNT: 1.4 10*9/L (ref 1.1–3.6)
LYMPHOCYTES RELATIVE PERCENT: 22.3 %
MEAN CORPUSCULAR HEMOGLOBIN CONC: 34.1 g/dL (ref 32.0–36.0)
MEAN CORPUSCULAR HEMOGLOBIN: 29.2 pg (ref 25.9–32.4)
MEAN CORPUSCULAR VOLUME: 85.7 fL (ref 77.6–95.7)
MEAN PLATELET VOLUME: 7.1 fL (ref 6.8–10.7)
MONOCYTES ABSOLUTE COUNT: 0.8 10*9/L (ref 0.3–0.8)
MONOCYTES RELATIVE PERCENT: 12.8 %
NEUTROPHILS ABSOLUTE COUNT: 3.8 10*9/L (ref 1.8–7.8)
NEUTROPHILS RELATIVE PERCENT: 59.4 %
NUCLEATED RED BLOOD CELLS: 0 /100{WBCs} (ref <=4)
PLATELET COUNT: 311 10*9/L (ref 150–450)
RED BLOOD CELL COUNT: 4.72 10*12/L (ref 4.26–5.60)
RED CELL DISTRIBUTION WIDTH: 14.2 % (ref 12.2–15.2)
WBC ADJUSTED: 6.5 10*9/L (ref 3.6–11.2)

## 2020-12-06 LAB — COMPREHENSIVE METABOLIC PANEL
ALBUMIN: 4.1 g/dL (ref 3.4–5.0)
ALKALINE PHOSPHATASE: 60 U/L (ref 46–116)
ALT (SGPT): 27 U/L (ref 10–49)
ANION GAP: 8 mmol/L (ref 5–14)
AST (SGOT): 19 U/L (ref <=34)
BILIRUBIN TOTAL: 0.5 mg/dL (ref 0.3–1.2)
BLOOD UREA NITROGEN: 14 mg/dL (ref 9–23)
BUN / CREAT RATIO: 15
CALCIUM: 9.2 mg/dL (ref 8.7–10.4)
CHLORIDE: 101 mmol/L (ref 98–107)
CO2: 28.1 mmol/L (ref 20.0–31.0)
CREATININE: 0.92 mg/dL
EGFR CKD-EPI AA MALE: >90 mL/min/{1.73_m2} (ref >=60)
EGFR CKD-EPI NON-AA MALE: >90 mL/min/{1.73_m2} (ref >=60)
GLUCOSE RANDOM: 76 mg/dL (ref 70–179)
POTASSIUM: 3.8 mmol/L (ref 3.4–4.8)
PROTEIN TOTAL: 7.5 g/dL (ref 5.7–8.2)
SODIUM: 137 mmol/L (ref 135–145)

## 2020-12-06 LAB — TOBRAMYCIN LEVEL, TROUGH: TOBRAMYCIN TROUGH: 0.6 ug/mL (ref 0.0–2.0)

## 2020-12-06 LAB — MAGNESIUM: MAGNESIUM: 2.1 mg/dL (ref 1.6–2.6)

## 2020-12-06 LAB — VANCOMYCIN, TROUGH: VANCOMYCIN TROUGH: <3 ug/mL — ABNORMAL LOW (ref 10.0–20.0)

## 2020-12-06 MED ADMIN — polyethylene glycol (MIRALAX) packet 17 g: 17 g | ORAL | @ 13:00:00

## 2020-12-06 MED ADMIN — insulin lispro (HumaLOG) injection 0-12 Units: 0-12 [IU] | SUBCUTANEOUS | @ 22:00:00

## 2020-12-06 MED ADMIN — tobramycin (NEBCIN) 280 mg in sodium chloride (NS) 0.9 % 100 mL IVPB: 280 mg | INTRAVENOUS | @ 02:00:00 | Stop: 2020-12-07

## 2020-12-06 MED ADMIN — pancrelipase (Lip-Prot-Amyl) (CREON) 24,000-76,000 -120,000 unit delayed release capsule 192,000 units of lipase: 8 | ORAL | @ 16:00:00

## 2020-12-06 MED ADMIN — sodium chloride 7% nebulizer solution 4 mL: 4 mL | RESPIRATORY_TRACT | @ 12:00:00

## 2020-12-06 MED ADMIN — piperacillin-tazobactam (ZOSYN) IVPB (premix) 4.5 g: 4.5 g | INTRAVENOUS | @ 08:00:00 | Stop: 2020-12-09

## 2020-12-06 MED ADMIN — gabapentin (NEURONTIN) capsule 600 mg: 600 mg | ORAL | @ 13:00:00

## 2020-12-06 MED ADMIN — FLUoxetine (PROzac) capsule 60 mg: 60 mg | ORAL | @ 13:00:00

## 2020-12-06 MED ADMIN — senna (SENOKOT) tablet 2 tablet: 2 | ORAL | @ 02:00:00

## 2020-12-06 MED ADMIN — traZODone (DESYREL) tablet 100 mg: 100 mg | ORAL | @ 02:00:00

## 2020-12-06 MED ADMIN — insulin lispro (HumaLOG) injection 0-12 Units: 0-12 [IU] | SUBCUTANEOUS | @ 02:00:00

## 2020-12-06 MED ADMIN — MORPhine injection 2 mg: 2 mg | INTRAVENOUS | @ 18:00:00 | Stop: 2020-12-06

## 2020-12-06 MED ADMIN — ondansetron (ZOFRAN-ODT) disintegrating tablet 4 mg: 4 mg | ORAL | @ 02:00:00

## 2020-12-06 MED ADMIN — phytonadione (vitamin K1) (MEPHYTON) tablet 5 mg: 5 mg | ORAL | @ 10:00:00 | Stop: 2020-12-06

## 2020-12-06 MED ADMIN — pramipexole (MIRAPEX) tablet 0.5 mg: .5 mg | ORAL | @ 22:00:00

## 2020-12-06 MED ADMIN — albuterol nebulizer solution 2.5 mg: 2.5 mg | RESPIRATORY_TRACT | @ 17:00:00

## 2020-12-06 MED ADMIN — lamoTRIgine (LaMICtal) tablet 200 mg: 200 mg | ORAL | @ 13:00:00

## 2020-12-06 MED ADMIN — famotidine (PEPCID) tablet 20 mg: 20 mg | ORAL | @ 13:00:00

## 2020-12-06 MED ADMIN — magnesium oxide (MAG-OX) tablet 400 mg: 400 mg | ORAL | @ 13:00:00

## 2020-12-06 MED ADMIN — fluticasone propionate (FLONASE) 50 mcg/actuation nasal spray 1 spray: 1 | NASAL | @ 13:00:00

## 2020-12-06 MED ADMIN — rivaroxaban (XARELTO) tablet 20 mg: 20 mg | ORAL | @ 22:00:00

## 2020-12-06 MED ADMIN — pediatric multivitamin-vit D3 1,500 unit-vit K 800 mcg (MVW COMPLETE FORMULATION) capsule: 1 | ORAL | @ 02:00:00

## 2020-12-06 MED ADMIN — albuterol nebulizer solution 2.5 mg: 2.5 mg | RESPIRATORY_TRACT | @ 12:00:00

## 2020-12-06 MED ADMIN — insulin glargine (LANTUS) injection 50 Units: 50 [IU] | SUBCUTANEOUS | @ 02:00:00

## 2020-12-06 MED ADMIN — magnesium oxide (MAG-OX) tablet 400 mg: 400 mg | ORAL | @ 02:00:00

## 2020-12-06 MED ADMIN — dornase alfa (PULMOZYME) 1 mg/mL solution 2.5 mg: 2.5 mg | RESPIRATORY_TRACT | @ 13:00:00

## 2020-12-06 MED ADMIN — piperacillin-tazobactam (ZOSYN) IVPB (premix) 4.5 g: 4.5 g | INTRAVENOUS | @ 02:00:00 | Stop: 2020-12-09

## 2020-12-06 MED ADMIN — dornase alfa (PULMOZYME) 1 mg/mL solution 2.5 mg: 2.5 mg | RESPIRATORY_TRACT

## 2020-12-06 MED ADMIN — polyethylene glycol (MIRALAX) packet 17 g: 17 g | ORAL | @ 18:00:00

## 2020-12-06 MED ADMIN — lisinopriL (PRINIVIL,ZESTRIL) tablet 10 mg: 10 mg | ORAL | @ 13:00:00

## 2020-12-06 MED ADMIN — pediatric multivitamin-vit D3 1,500 unit-vit K 800 mcg (MVW COMPLETE FORMULATION) capsule: 1 | ORAL | @ 13:00:00

## 2020-12-06 MED ADMIN — elexacaftor-tezacaftor-ivacaft (TRIKAFTA) tablet 1 tablet: 1 | ORAL | @ 02:00:00

## 2020-12-06 MED ADMIN — atorvastatin (LIPITOR) tablet 20 mg: 20 mg | ORAL | @ 02:00:00

## 2020-12-06 MED ADMIN — sodium chloride 7% nebulizer solution 4 mL: 4 mL | RESPIRATORY_TRACT | @ 17:00:00

## 2020-12-06 MED ADMIN — sodium chloride 7% nebulizer solution 4 mL: 4 mL | RESPIRATORY_TRACT

## 2020-12-06 MED ADMIN — ondansetron (ZOFRAN-ODT) disintegrating tablet 4 mg: 4 mg | ORAL | @ 13:00:00

## 2020-12-06 MED ADMIN — insulin lispro (HumaLOG) injection 0-12 Units: 0-12 [IU] | SUBCUTANEOUS | @ 13:00:00

## 2020-12-06 MED ADMIN — cetirizine (ZyrTEC) tablet 10 mg: 10 mg | ORAL | @ 13:00:00

## 2020-12-06 MED ADMIN — fluticasone furoate-vilanteroL (BREO ELLIPTA) 200-25 mcg/dose inhaler 1 puff: 1 | RESPIRATORY_TRACT | @ 13:00:00

## 2020-12-06 MED ADMIN — pancrelipase (Lip-Prot-Amyl) (CREON) 24,000-76,000 -120,000 unit delayed release capsule 192,000 units of lipase: 8 | ORAL | @ 22:00:00

## 2020-12-06 MED ADMIN — melatonin tablet 10.5 mg: 10 mg | ORAL | @ 02:00:00

## 2020-12-06 MED ADMIN — oxyCODONE (ROXICODONE) immediate release tablet 5 mg: 5 mg | ORAL | @ 14:00:00 | Stop: 2020-12-08

## 2020-12-06 MED ADMIN — elexacaftor-tezacaftor-ivacaft (TRIKAFTA) tablet 2 tablet **Patient Supplied**: 2 | ORAL | @ 13:00:00

## 2020-12-06 MED ADMIN — polyethylene glycol (MIRALAX) packet 17 g: 17 g | ORAL | @ 02:00:00

## 2020-12-06 MED ADMIN — piperacillin-tazobactam (ZOSYN) IVPB (premix) 4.5 g: 4.5 g | INTRAVENOUS | @ 18:00:00 | Stop: 2020-12-09

## 2020-12-06 MED ADMIN — pantoprazole (PROTONIX) EC tablet 20 mg: 20 mg | ORAL | @ 13:00:00

## 2020-12-06 MED ADMIN — amitriptyline (ELAVIL) tablet 100 mg: 100 mg | ORAL | @ 02:00:00

## 2020-12-06 MED ADMIN — albuterol nebulizer solution 2.5 mg: 2.5 mg | RESPIRATORY_TRACT

## 2020-12-06 MED ADMIN — pancrelipase (Lip-Prot-Amyl) (CREON) 24,000-76,000 -120,000 unit delayed release capsule 192,000 units of lipase: 8 | ORAL | @ 13:00:00

## 2020-12-06 MED ADMIN — tobramycin (NEBCIN) 280 mg in sodium chloride (NS) 0.9 % 100 mL IVPB: 280 mg | INTRAVENOUS | @ 16:00:00 | Stop: 2020-12-07

## 2020-12-06 MED FILL — TRIKAFTA 100-50-75 MG (D)/150 MG (N) TABLETS: 28 days supply | Qty: 84 | Fill #1

## 2020-12-06 NOTE — Unmapped (Signed)
Patient is alert and oriented x 4, self care. Vss, afebrile, no s/s of infection. No c/o pain. Left chest wall port C, D, I. Continues on IV abx and scheduled NEB treatments. No concerns. will continue with the plan of care.   Problem: Adult Inpatient Plan of Care  Goal: Plan of Care Review  Outcome: Progressing  Goal: Patient-Specific Goal (Individualized)  Outcome: Progressing  Goal: Absence of Hospital-Acquired Illness or Injury  Outcome: Progressing  Intervention: Identify and Manage Fall Risk  Recent Flowsheet Documentation  Taken 12/05/2020 2000 by Mayo Ao, RN  Safety Interventions:   nonskid shoes/slippers when out of bed   isolation precautions  Intervention: Prevent and Manage VTE (Venous Thromboembolism) Risk  Recent Flowsheet Documentation  Taken 12/05/2020 2000 by Andrey Farmer Marlow-Freeman, RN  Activity Management: up ad lib  Goal: Optimal Comfort and Wellbeing  Outcome: Progressing  Goal: Readiness for Transition of Care  Outcome: Progressing  Goal: Rounds/Family Conference  Outcome: Progressing     Problem: Infection  Goal: Absence of Infection Signs and Symptoms  Outcome: Progressing  Intervention: Prevent or Manage Infection  Recent Flowsheet Documentation  Taken 12/05/2020 2000 by Andrey Farmer Marlow-Freeman, RN  Isolation Precautions: contact precautions maintained     Problem: Adjustment to Illness (Cystic Fibrosis)  Goal: Optimal Coping  Outcome: Progressing     Problem: Infection (Cystic Fibrosis)  Goal: Absence of Infection Signs and Symptoms  Outcome: Progressing  Intervention: Manage Infection and Prevent Transmission  Recent Flowsheet Documentation  Taken 12/05/2020 2000 by Andrey Farmer Marlow-Freeman, RN  Isolation Precautions: contact precautions maintained     Problem: Oral Intake Inadequate (Cystic Fibrosis)  Goal: Optimal Nutrition Intake  Outcome: Progressing     Problem: Malabsorption (Cystic Fibrosis)  Goal: Optimal Bowel Elimination  Outcome: Progressing     Problem: Respiratory Compromise (Cystic Fibrosis)  Goal: Effective Oxygenation and Ventilation  Outcome: Progressing     Problem: Airway Clearance Ineffective  Goal: Effective Airway Clearance  Outcome: Progressing  Intervention: Promote Airway Secretion Clearance  Recent Flowsheet Documentation  Taken 12/05/2020 2000 by Andrey Farmer Marlow-Freeman, RN  Activity Management: up ad lib     Problem: Diabetes Comorbidity  Goal: Blood Glucose Level Within Targeted Range  Outcome: Progressing     Problem: Hypertension Comorbidity  Goal: Blood Pressure in Desired Range  Outcome: Progressing

## 2020-12-06 NOTE — Unmapped (Signed)
Cystic Fibrosis Nutrition Assessment    Inpatient, Telehealth via phone: MD Consult this admission and related follow up  Primary Pulmonary Provider: Dr [name]   ===================================================================  Christian Young is a 35 y.o. male w/ PMH significant for CF, CFRD, pancreatic insufficiency who presented on 4/14 with CF exacerbation. Plan for IV abx.    4/26: [name] continues on IV antibiotics with plan for discharge on 4/29. Nausea improved, loose stools with daily bowel regimen per chart review. Appears Christian Young ran out of home supply of Creon 36,000s and is now under-dosed on Creon 24,000. On High Calorie High Protein Diet, consuming 100% of provided meals per EPIC documentation. No new weights documented since admission. Unable to reach [place] via telephone today.    4/19Romeo Young remains admitted for IV antibiotics. Intermittent nausea requiring zofran. On High Calorie High Protein Diet, consuming 50-75% of provided meals per [employer]. Last documented BM yesterday, 4/18. Requested several snacks today.  ===================================================================  INTERVENTION:    1. Continue High Calorie High Protein Diet    2. Recommend CF vitamin regimen:  MVW Complete Formulation gel cap 2 daily as ordered    3. Please obtain new weight prior to discharge    4. Continue snacks as ordered in computrition  - Lays, peanut butter and jelly, fruit, cheese and crackers    5. Adjust enzyme regimen based on patient's home dose  - Creon 24,000 x 11-12 caps/meal, 5-6 caps/snack    6. Continue remainder of nutrition regimen:  - acid reducer; pepcid, protonix  - bowel regimen; miralax daily, senna PRN  - anti-nausea; zofran PRN  - insulin regimen per endo    Inpatient:   Will follow up with patient per protocol: 1-2 times per week (and more frequent as indicated)  Monitor for potential discharge needs with multi-disciplinary team. ===================================================================  ASSESSMENT:  Nutrition Category = Adult Class 1 Obesity: BMI 30 to < 35 kg/m2    Estimated daily needs: 3717 kcal/day, 126-169 g PRO/day, 3216 mL fluid/day  Calories estimated using Cystic Fibrosis Conference Formula, protein per DRI x 1.5-2, fluid per [facility]    Current diet is appropriate for CF. Current PO intake is adequate to meet estimated CF needs. Patient continues to work towards goals for weight management.   Enzyme dose is within established guidelines. Vitamin prescription is appropriate to reach/maintain optimal fat soluble vitamin levels. Patient may benefit from vitamin K supplementation while on IV antibiotics. Sodium needs for CF met with PO and/or supplement. Bowel regimen is appropriate. Acid reducer appropriate for GERD and enzyme activation. Patient on CFTR modulator is consuming adequate amounts of fat-containing foods with prescribed medication to optimize absorption.    ASPEN/AND Malnutrition Screening:  Patient does not meet ASPEN/AND criteria for malnutrition at this time.    Nutrition Focused Physical Exam:   Assessment not indicated d/t lack of malnutrition risk factors.    Goals:  1. Ongoing:  Meet estimated daily needs  2. Ongoing:  Reach/maintain established anthropometric goals for Adult CF: BMI < 30 kg/m2   3. Ongoing:  Normal fat-soluble vitamin levels: Vitamin A, Vitamin E and PT per lab range; Vitamin D 25OH total >30   4. Ongoing:  Maintain glucose control. Carbohydrate content of diet should comprise 40-50% of total calorie needs, but carbohydrates are not restricted in this population.    5. Ongoing:  Meet sodium needs for CF     Nutrition goals reviewed, and relevant barriers identified and addressed: none evident.   Patient is  evaluated to have good  willingness and ability to achieve nutrition goals.   ===================================================================  INPATIENT:  Christian Young is admitted with Bronchiectasis with acute exacerbation (CMS-HCC) [J47.1]  Shortness of breath [R06.02]  Cough [R05.9]  Cystic fibrosis with pulmonary exacerbation (CMS-HCC) [E84.0]  Chest pain, unspecified type [R07.9].    Current Nutrition Orders (inpatient):  Oral intake        Nutrition Orders   (From admission, onward)             Start     Ordered    11/25/20 1440  Nutrition Therapy High Calorie High Protein  Effective now        Question:  Nutrition Therapy:  Answer:  High Calorie High Protein    11/25/20 1439              CF Nutrition related medications (inpatient): Nutritionally relevant medications reviewed.  lipitor  trikafta  pepcid  Insulin regimen  Creon 24,000 x 8 caps/meal, 4 caps/snack  protonix  MVW complete formulation gel cap BID  Mag-ox  miralax once daily  PRN senna  PRN zofran  xarelto    CF Nutrition related labs (inpatient): reviewed  Nutritionally pertinent labs reviewed.   ==================================================================  CLINICAL DATA:  Past Medical History:   Diagnosis Date   ??? Anxiety    ??? Chronic pain disorder    ??? Cystic fibrosis (CMS-HCC)    ??? Depression    ??? Hypertension    ??? Nonproductive cough 04/05/2018     Anthroprometric Evaluation:  Weight changes: per EPIC wt hx, pt has experienced 21 lb weight loss over the past year; intentional  CFTR modulator and weight change: On Trikafta  BMI Readings from Last 3 Encounters:   11/24/20 30.28 kg/m??   06/28/20 31.64 kg/m??   01/04/20 33.09 kg/m??     Wt Readings from Last 3 Encounters:   11/24/20 (!) 101.3 kg (223 lb 4.8 oz)   06/28/20 (!) 105.8 kg (233 lb 4.8 oz)   01/04/20 (!) 110.7 kg (244 lb)     Ht Readings from Last 3 Encounters:   11/24/20 182.9 cm (6' 0.01)   06/28/20 182.9 cm (6')   01/04/20 182.9 cm (6')     ==================================================================  Energy Intake (outpatient):  Diet: [name] endorses stable appetite and PO intake PTA, no deviation from baseline. Weight loss achieved by consuming smaller portion sizes.     Allergies, Intolerances, Sensitivities, and/or Cultural/Religious Dietary Restrictions:  See below  Allergies   Allergen Reactions   ??? Aztreonam Anaphylaxis, Hives, Nausea And Vomiting and Rash     fevers  Patient stated that he only vomited x1 with [place] in the past.????    fevers  Patient stated that he only vomited x1 with [place] in the past.????   ??? [place] [Aztreonam Lysine] Anaphylaxis   ??? Cefepime Itching, Nausea Only and Other (See Comments)     Headaches also   ??? Other Anaphylaxis and Other (See Comments)     Other reaction(s): Other (See Comments)  Bananas: itchy throat  Slo Bid record from [employer] states anaphylaxis.????Pt states this was from childhood and does not know reaction.  Bananas, causes itchy throat   ??? Slo-Bid 100 Anaphylaxis   ??? Tobramycin Tinnitus     From OSH record-documented as tinnitus but has received IV tobra with close monitoring.  Pt has received Tobramycin at [facility] since this allergy documented    From OSH record-documented as tinnitus but has received IV tobra with close  monitoring.   ??? Banana Itching and Nausea And Vomiting   Sodium in diet: Adequate from diet  Calcium in diet:  Adequate from diet  Food Insecurity:    -   Food Insecurity: No Food Insecurity   ??? Worried About Programme researcher, broadcasting/film/video in the Last Year: Never true   ??? Ran Out of Food in the Last Year: Never true   CFTR modulator and Diet: Prescribed Trikafta (elexacaftor/tezacaftor/ivacaftor).  PO Supplements: none  Patient resources for DME/formula: n/a  Appetite Stimulant: none  Enteral feeding tube: none  Physical activity: limited recently 2/2 back pain    GI/Malabsorption (outpatient):  Enzyme brand, (meals/snacks):  Creon 36,000 @ 8/meal and 4/snack  Enzyme administration details: correct pre-meal administration., good compliance at all meals and snacks  Enzyme dose per MEAL (units lipase/kg/meal) 2843  Enzyme dose per DAY (units lipase/kg/day) 29562  GI meds: Nutritionally relevant medications reviewed.  Stools (steatorrhea): 1-2 daily, not greasy  Stools (constipation): no s/s of constipation  GI symptoms:  none  Fecal Fat Studies: pancreatic insufficient    No results found for: [id number]  Lab Results   Component Value Date    ELAST <15 (L) 03/19/2017     No results found for: PELAI    Vitamins/Minerals (outpatient):  CF-specific MVI, dose, compliance: MVW Complete Formulation Softgel regular 2 daily, good compliance  Other vitamins/minerals/herbals: none  Patient Resources for vitamins: -none  Calcium supplement: none  Fat-soluble vitamin levels: low vitamin D on last draw  Lab Results   Component Value Date    VITAMINA 42.4 01/18/2020    VITAMINA 44.4 11/19/2016     Lab Results   Component Value Date    CRP <4.0 11/24/2020    CRP <4.0 06/28/2020    CRP 3 01/18/2020     Lab Results   Component Value Date    VITDTOTAL 27.9 03/19/2017     No results found for: VITAME  Lab Results   Component Value Date    PT 21.2 (H) 12/01/2020    PT 16.9 (H) 11/26/2020    PT 20.7 (H) 06/28/2020     Lab Results   Component Value Date    DESGCARBPT 0.2 03/20/2017     No results found for: PIVKAII    Bone Health: Abnormal vitamin D level, being treated. Adequate calcium intake from diet. Due for DEXA next year. Normal bone density July 2018.     CF Related Diabetes: yes, on insulin    Lab Results   Component Value Date    GLUF 178 (H) 01/18/2020     No results found for: GLUCOSE2HR  Lab Results   Component Value Date    A1C 10.7 (H) 11/29/2020    A1C 9.4 (H) 01/18/2020    A1C 8.8 (H) 09/29/2019     Sharrie Self MPH, RD, LDN  Pager: [phone]

## 2020-12-06 NOTE — Unmapped (Signed)
Hospitalist Daily Progress Note     LOS: 12 days       Assessment/Plan:  Principal Problem:    Cystic fibrosis with pulmonary exacerbation (CMS-HCC)  Active Problems:    Essential hypertension    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)    Pancreatic insufficiency due to cystic fibrosis (CMS-HCC)    Chronic deep vein thrombosis (DVT) of lower extremity (CMS-HCC)  Resolved Problems:    * No resolved hospital problems. *       Christian Young is a 35 y.o. male with PMHx of CF who presented to [facility] with Cystic fibrosis with pulmonary exacerbation.    Acute exacerbation of CF bronchiectasis: Admitted for 14d IV abx. Hx MSSA and 2 variants of PsA (one pan resistant and one with intermediate resistance). AFB smear negative, blood cultures negative. Pt unable to produce specimen for new culture. MRSA screen negative. PFTs 12/01/20 unchanged from prior. Clinically improving with IV antibiotic course.  - Pulmonology following  - Continue with IV Zosyn and Tobramycin, completing course on 4/28  - Monitor CMP, Mg, CBC daily  - Continue Trikafta (non-form), Breo (for Symbicort) and Pulmozyme  - Airway clearance with albuterol TID, 7% hypertonic saline, chest vest, and Aerobika  - Twice weekly VitK while on IV antibiotics    Nausea: Resolved. Continue scheduled PO Zofran with antibiotics. Continue with PRN Compazine for breakthrough    CF Related Diabetes Mellitus: Blood sugars intermittently elevated.  - Lantus 50 units nightly, sliding scale     History of pSBO/ileus vs DIOS. Miralax TID, senna. Encourage daily BM goal    Pancreatic insufficiency due to CF: Creon 8/meals, 4/snacks. ADEK vitamins    Hx DVT: Continue Xarelto 20. Provide vitamin K twice weekly while on IV abx.     FEN/GI:  - High protein/high calorie diet  - No IVF  - Replete lytes PRN    PPx: Home rivaroxaban    Dispo: Will complete a 14 day course of antibiotics on 4/28, discharge planned for 4/29    Please page the [facility]) pager at [phone] with questions.    Subjective:   No acute events overnight. No complaints, nausea has resolved. Denies constipation, having regular bowel movements and does not feel distended. No hemoptysis. Feels like he is near his baseline.     Objective:   Vital signs in last 24 hours:  Temp:  [36.2 ??C (97.2 ??F)-36.3 ??C (97.3 ??F)] 36.2 ??C (97.2 ??F)  Heart Rate:  [77-94] 94  Resp:  [17-19] 18  BP: (118-131)/(65-88) 121/78  MAP (mmHg):  [105] 105  SpO2:  [96 %] 96 %    Labs personally reviewed    Gen: Sitting up in bed. Awake and alert. NAD. Non-toxic.  HEENT: EOMI, sclera anicteric. MMM.  Chest: Port in L chest accessed, clean overlying dressing.  Resp: Normal WOB. Equal air movement bilaterally. CTAB.  CV: RRR, normal s1s2, no m/g/r.   Abd: Soft, NTND, NABS.  Ext: WWP. No edema.

## 2020-12-06 NOTE — Unmapped (Addendum)
PULMONARY CONSULT  NOTE      Patient: Christian Young(1985/09/24)  Reason for consultation: Christian Young is a 35 y.o. male who is seen in consultation at the request of Kathryne Hitch, MD for comprehensive evaluation of CF exacerbation.    Assessment and Recommendations:      Principal Problem:    Cystic fibrosis with pulmonary exacerbation (CMS-HCC)  Active Problems:    Essential hypertension    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)    Pancreatic insufficiency due to cystic fibrosis (CMS-HCC)    Chronic deep vein thrombosis (DVT) of lower extremity (CMS-HCC)  Resolved Problems:    * No resolved hospital problems. *    Christian Young is a 35 y.o. male with PMHx of CF who presented to [facility] with Cystic fibrosis with pulmonary exacerbation (CMS-HCC).  ??  1. Acute exacerbation of CF bronchiectasis: Has grown MSSA and 2 variants of PsA (one pan resistant and one with intermediate resistance), unable to produce sputum sample here. Spirometry 4/21 with FEV1 at baseline.  - Zosyn 4.5 mg q6h, tobramycin IV, pharmacy to dose, does BID dosing, Mg with BMP while on Tobramycin (twice weekly is fine). Will complete 14 days on Thursday 4/28 and will discharge home on 4/29.  - Continue Trikafta (non-formulary), Breo (formulary alternative for Symbicort) and Pulmozyme. At time of discharge, please reduce Pulmozyme frequency to once a day.  - Airway clearance with albuterol TID, 7% hypertonic saline, chest vest, and aerobika  - INR elevated, s/p vit K daily for 5 days, though suspecting influenced by Xarelto use. Now twice weekly while on IV abx.     2. CFRD: HgbA1c 10.7. Has Christian Young but would prefer to go back to Christian Young for continuous readings. At home, was experiencing hypoglycemia in the AM. Fasting lipid panel notable for low HDL.  - Continue current insulin regimen. Consider splitting dose (50 units lantus) into two injections.  - Needs outpatient endocrinology appointment - overdue and HgbA1c above goal despite intentional weight loss. I have reached out to his CF MD and RN.    3. Port site pain after difficulty accessing: No prior history of difficulty with this port (placed 2-3 years ago here).  - Would hold on morphine.  - Will have CF RN attempt port flush at his follow up visit. If unable to flush/access, will have port study done.    We appreciate the opportunity to assist in the care of this patient.  Please page [phone] with any questions.    Viona Gilmore, MD MPH    Subjective:      History of Present Illness:  Christian Young is a 35 y.o. male admitted for CF exacerbation.  Has had recent pleuritic chest pain and fever.  Recommended by primary CF doc to present for admission and IV Abx.    Interval history 12/06/20: Had issues with infusion of abx through port.  RN attempted to reaccess without success, resulting in discomfort for Christian Young. Describes pain as like a stomach ache and did not respond to oxycodone. Wonders if pain is 2/2 skin irritation from dressing.    Past medical, past surgical, family, and social histories and allergies reviewed from initial admission note and otherwise in Epic.     Review of Systems: A comprehensive review of systems was performed and was negative except as above in HPI.     Objective:      Physical Exam:  Vitals:    12/05/20 1154 12/05/20 2018 12/05/20  2146 12/06/20 0915   BP: 118/65  131/88 121/78   Pulse: 93 77 94    Resp: 19 17 18     Temp: 36.3 ??C (97.3 ??F)  36.2 ??C (97.2 ??F)    TempSrc:   Oral    SpO2: 96% 96% 96%    Weight:       Height:           Gen: Patient awake, alert, oriented to time place and person, no pallor, icterus,  cyanosis or clubbing.  Eyes: Pupils equal round and reacting light bilaterally  Head neck ENT: No JVD, no lymphadenopathy-cervical or supraclavicular, no thyromegaly, moist mucosa, normal oropharynx  CVS: S1-S2 heard normally, no murmurs appreciated, no rubs or gallops. No edema.  Respiratory: Air entry equal bilaterally without any crackles or wheeze  Abdomen: Soft, nontender, nondistended, no organomegaly, bowel sounds present  Neuro: Nonfocal neurological exam, no sensory deficits, cranial nerves grossly intact  Skin: Mild dry erythematous skin in distribution of adhesive for port dressing.  Center of port slightly red but no drainage or bleeding. No tenderness to palpation around port site. No fluctuance or swelling.     Malnutrition Assessment by RD:          Diagnostic Review:   All labs and images were personally reviewed.

## 2020-12-06 NOTE — Unmapped (Signed)
Aminoglycoside Therapeutic Monitoring Pharmacy Note    Christian Young is a 35 y.o. male continuing tobramycin. Date of therapy initiation: 11/24/20    Indication: CF exacerbation    Prior Dosing Information: Current regimen 280 mg IV every 12 hours      Goals:  Therapeutic Drug Levels  ?? Trough level: tobramycin <1 mg/L  ?? Peak level: tobramycin 10-14 mg/L    Additional Clinical Monitoring/Outcomes  Renal function, volume status (intake and output)    Results:   ?? Trough level 0.6    Wt Readings from Last 1 Encounters:   11/24/20 (!) 101.3 kg (223 lb 4.8 oz)     Lab Results   Component Value Date    CREATININE 0.92 12/06/2020       Pharmacokinetic Considerations and Significant Drug Interactions:  ??? Adult (calculated on 12/03/20): Vd = 26.3 L, ke = 0.277 hr-1  ??? Concurrent nephrotoxic meds: Not applicable    Assessment/Plan:  Levels are within goal range    Recommendation(s)  ??? Continue current regimen of 280mg  IV q 12 hours  ??? Obtained level due to increasing SCr, will hold off on obtaining anymore levels as therapy finishes tomorrow    Follow-up  ??? Level due: in 3-5 days  ??? A pharmacist will continue to monitor and order levels as appropriate    Please page service pharmacist with questions/clarifications.    Cherlynn Polo, Pharm D, BCPS

## 2020-12-06 NOTE — Unmapped (Signed)
Problem: Respiratory Compromise (Cystic Fibrosis)  Goal: Effective Oxygenation and Ventilation  Outcome: Ongoing - Unchanged     Problem: Airway Clearance Ineffective  Goal: Effective Airway Clearance  Outcome: Ongoing - Unchanged     Patient received all neb tx as scheduled with no adverse reaction. Patient using home chest vest for airway clearance. No respiratory distress noted

## 2020-12-07 LAB — CBC W/ AUTO DIFF
BASOPHILS ABSOLUTE COUNT: 0.1 10*9/L (ref 0.0–0.1)
BASOPHILS RELATIVE PERCENT: 1.3 %
EOSINOPHILS ABSOLUTE COUNT: 0.3 10*9/L (ref 0.0–0.5)
EOSINOPHILS RELATIVE PERCENT: 5.3 %
HEMATOCRIT: 40.1 % (ref 39.0–48.0)
HEMOGLOBIN: 13.5 g/dL (ref 12.9–16.5)
LYMPHOCYTES ABSOLUTE COUNT: 1.1 10*9/L (ref 1.1–3.6)
LYMPHOCYTES RELATIVE PERCENT: 21.3 %
MEAN CORPUSCULAR HEMOGLOBIN CONC: 33.8 g/dL (ref 32.0–36.0)
MEAN CORPUSCULAR HEMOGLOBIN: 29.1 pg (ref 25.9–32.4)
MEAN CORPUSCULAR VOLUME: 85.9 fL (ref 77.6–95.7)
MEAN PLATELET VOLUME: 7.3 fL (ref 6.8–10.7)
MONOCYTES ABSOLUTE COUNT: 0.7 10*9/L (ref 0.3–0.8)
MONOCYTES RELATIVE PERCENT: 13.2 %
NEUTROPHILS ABSOLUTE COUNT: 3.2 10*9/L (ref 1.8–7.8)
NEUTROPHILS RELATIVE PERCENT: 58.9 %
NUCLEATED RED BLOOD CELLS: 0 /100{WBCs} (ref <=4)
PLATELET COUNT: 285 10*9/L (ref 150–450)
RED BLOOD CELL COUNT: 4.66 10*12/L (ref 4.26–5.60)
RED CELL DISTRIBUTION WIDTH: 14.8 % (ref 12.2–15.2)
WBC ADJUSTED: 5.4 10*9/L (ref 3.6–11.2)

## 2020-12-07 LAB — COMPREHENSIVE METABOLIC PANEL
ALBUMIN: 4 g/dL (ref 3.4–5.0)
ALKALINE PHOSPHATASE: 58 U/L (ref 46–116)
ALT (SGPT): 25 U/L (ref 10–49)
ANION GAP: 7 mmol/L (ref 5–14)
AST (SGOT): 19 U/L (ref <=34)
BILIRUBIN TOTAL: 0.5 mg/dL (ref 0.3–1.2)
BLOOD UREA NITROGEN: 14 mg/dL (ref 9–23)
BUN / CREAT RATIO: 14
CALCIUM: 9.2 mg/dL (ref 8.7–10.4)
CHLORIDE: 101 mmol/L (ref 98–107)
CO2: 31.5 mmol/L — ABNORMAL HIGH (ref 20.0–31.0)
CREATININE: 0.98 mg/dL
EGFR CKD-EPI AA MALE: >90 mL/min/{1.73_m2} (ref >=60)
EGFR CKD-EPI NON-AA MALE: >90 mL/min/{1.73_m2} (ref >=60)
GLUCOSE RANDOM: 83 mg/dL (ref 70–179)
POTASSIUM: 4.2 mmol/L (ref 3.4–4.8)
PROTEIN TOTAL: 7.3 g/dL (ref 5.7–8.2)
SODIUM: 139 mmol/L (ref 135–145)

## 2020-12-07 LAB — MAGNESIUM: MAGNESIUM: 2.1 mg/dL (ref 1.6–2.6)

## 2020-12-07 MED ADMIN — cetirizine (ZyrTEC) tablet 10 mg: 10 mg | ORAL | @ 13:00:00

## 2020-12-07 MED ADMIN — pramipexole (MIRAPEX) tablet 0.5 mg: .5 mg | ORAL | @ 22:00:00

## 2020-12-07 MED ADMIN — polyethylene glycol (MIRALAX) packet 17 g: 17 g | ORAL | @ 02:00:00

## 2020-12-07 MED ADMIN — magnesium oxide (MAG-OX) tablet 400 mg: 400 mg | ORAL | @ 13:00:00

## 2020-12-07 MED ADMIN — polyethylene glycol (MIRALAX) packet 17 g: 17 g | ORAL | @ 19:00:00

## 2020-12-07 MED ADMIN — dornase alfa (PULMOZYME) 1 mg/mL solution 2.5 mg: 2.5 mg | RESPIRATORY_TRACT

## 2020-12-07 MED ADMIN — dornase alfa (PULMOZYME) 1 mg/mL solution 2.5 mg: 2.5 mg | RESPIRATORY_TRACT | @ 13:00:00

## 2020-12-07 MED ADMIN — amitriptyline (ELAVIL) tablet 100 mg: 100 mg | ORAL | @ 02:00:00

## 2020-12-07 MED ADMIN — albuterol nebulizer solution 2.5 mg: 2.5 mg | RESPIRATORY_TRACT

## 2020-12-07 MED ADMIN — albuterol nebulizer solution 2.5 mg: 2.5 mg | RESPIRATORY_TRACT | @ 17:00:00

## 2020-12-07 MED ADMIN — tobramycin (NEBCIN) 280 mg in sodium chloride (NS) 0.9 % 100 mL IVPB: 280 mg | INTRAVENOUS | @ 15:00:00 | Stop: 2020-12-07

## 2020-12-07 MED ADMIN — albuterol nebulizer solution 2.5 mg: 2.5 mg | RESPIRATORY_TRACT | @ 12:00:00

## 2020-12-07 MED ADMIN — atorvastatin (LIPITOR) tablet 20 mg: 20 mg | ORAL | @ 02:00:00

## 2020-12-07 MED ADMIN — lamoTRIgine (LaMICtal) tablet 200 mg: 200 mg | ORAL | @ 13:00:00

## 2020-12-07 MED ADMIN — famotidine (PEPCID) tablet 20 mg: 20 mg | ORAL | @ 13:00:00

## 2020-12-07 MED ADMIN — insulin lispro (HumaLOG) injection 0-12 Units: 0-12 [IU] | SUBCUTANEOUS | @ 22:00:00

## 2020-12-07 MED ADMIN — sodium chloride 7% nebulizer solution 4 mL: 4 mL | RESPIRATORY_TRACT | @ 12:00:00

## 2020-12-07 MED ADMIN — piperacillin-tazobactam (ZOSYN) IVPB (premix) 4.5 g: 4.5 g | INTRAVENOUS | @ 07:00:00 | Stop: 2020-12-09

## 2020-12-07 MED ADMIN — piperacillin-tazobactam (ZOSYN) IVPB (premix) 4.5 g: 4.5 g | INTRAVENOUS | @ 19:00:00 | Stop: 2020-12-09

## 2020-12-07 MED ADMIN — pediatric multivitamin-vit D3 1,500 unit-vit K 800 mcg (MVW COMPLETE FORMULATION) capsule: 1 | ORAL | @ 02:00:00

## 2020-12-07 MED ADMIN — fluticasone propionate (FLONASE) 50 mcg/actuation nasal spray 1 spray: 1 | NASAL | @ 13:00:00

## 2020-12-07 MED ADMIN — FLUoxetine (PROzac) capsule 60 mg: 60 mg | ORAL | @ 13:00:00

## 2020-12-07 MED ADMIN — fluticasone furoate-vilanteroL (BREO ELLIPTA) 200-25 mcg/dose inhaler 1 puff: 1 | RESPIRATORY_TRACT | @ 12:00:00

## 2020-12-07 MED ADMIN — piperacillin-tazobactam (ZOSYN) IVPB (premix) 4.5 g: 4.5 g | INTRAVENOUS | @ 02:00:00 | Stop: 2020-12-09

## 2020-12-07 MED ADMIN — insulin glargine (LANTUS) injection 50 Units: 50 [IU] | SUBCUTANEOUS | @ 02:00:00

## 2020-12-07 MED ADMIN — elexacaftor-tezacaftor-ivacaft (TRIKAFTA) tablet 1 tablet: 1 | ORAL | @ 02:00:00

## 2020-12-07 MED ADMIN — oxyCODONE (ROXICODONE) immediate release tablet 5 mg: 5 mg | ORAL | @ 02:00:00 | Stop: 2020-12-08

## 2020-12-07 MED ADMIN — ondansetron (ZOFRAN-ODT) disintegrating tablet 4 mg: 4 mg | ORAL | @ 13:00:00

## 2020-12-07 MED ADMIN — pediatric multivitamin-vit D3 1,500 unit-vit K 800 mcg (MVW COMPLETE FORMULATION) capsule: 1 | ORAL | @ 13:00:00

## 2020-12-07 MED ADMIN — magnesium oxide (MAG-OX) tablet 400 mg: 400 mg | ORAL | @ 02:00:00

## 2020-12-07 MED ADMIN — pancrelipase (Lip-Prot-Amyl) (CREON) 24,000-76,000 -120,000 unit delayed release capsule 288,000 units of lipase: 12 | ORAL | @ 22:00:00

## 2020-12-07 MED ADMIN — traZODone (DESYREL) tablet 100 mg: 100 mg | ORAL | @ 02:00:00

## 2020-12-07 MED ADMIN — elexacaftor-tezacaftor-ivacaft (TRIKAFTA) tablet 2 tablet **Patient Supplied**: 2 | ORAL | @ 13:00:00

## 2020-12-07 MED ADMIN — pantoprazole (PROTONIX) EC tablet 20 mg: 20 mg | ORAL | @ 13:00:00

## 2020-12-07 MED ADMIN — rivaroxaban (XARELTO) tablet 20 mg: 20 mg | ORAL | @ 22:00:00

## 2020-12-07 MED ADMIN — pancrelipase (Lip-Prot-Amyl) (CREON) 24,000-76,000 -120,000 unit delayed release capsule 192,000 units of lipase: 8 | ORAL | @ 13:00:00 | Stop: 2020-12-07

## 2020-12-07 MED ADMIN — acetaminophen (TYLENOL) tablet 650 mg: 650 mg | ORAL | @ 13:00:00

## 2020-12-07 MED ADMIN — insulin lispro (HumaLOG) injection 0-12 Units: 0-12 [IU] | SUBCUTANEOUS | @ 02:00:00

## 2020-12-07 MED ADMIN — gabapentin (NEURONTIN) capsule 600 mg: 600 mg | ORAL | @ 13:00:00

## 2020-12-07 MED ADMIN — melatonin tablet 10.5 mg: 10 mg | ORAL | @ 02:00:00

## 2020-12-07 MED ADMIN — piperacillin-tazobactam (ZOSYN) IVPB (premix) 4.5 g: 4.5 g | INTRAVENOUS | @ 13:00:00 | Stop: 2020-12-09

## 2020-12-07 MED ADMIN — senna (SENOKOT) tablet 2 tablet: 2 | ORAL | @ 02:00:00

## 2020-12-07 MED ADMIN — sodium chloride 7% nebulizer solution 4 mL: 4 mL | RESPIRATORY_TRACT | @ 17:00:00

## 2020-12-07 MED ADMIN — pancrelipase (Lip-Prot-Amyl) (CREON) 24,000-76,000 -120,000 unit delayed release capsule 192,000 units of lipase: 8 | ORAL | @ 17:00:00 | Stop: 2020-12-07

## 2020-12-07 MED ADMIN — sodium chloride 7% nebulizer solution 4 mL: 4 mL | RESPIRATORY_TRACT

## 2020-12-07 MED ADMIN — tobramycin (NEBCIN) 280 mg in sodium chloride (NS) 0.9 % 100 mL IVPB: 280 mg | INTRAVENOUS | @ 03:00:00 | Stop: 2020-12-07

## 2020-12-07 MED ADMIN — ondansetron (ZOFRAN-ODT) disintegrating tablet 4 mg: 4 mg | ORAL | @ 02:00:00

## 2020-12-07 MED ADMIN — lisinopriL (PRINIVIL,ZESTRIL) tablet 10 mg: 10 mg | ORAL | @ 13:00:00

## 2020-12-07 NOTE — Unmapped (Signed)
Vss; afebrile. Prn oxy for pain at port site.  IV fluids/ abx infused thru piv and tolerated well. Monitoring BG. All needs met. Will continue to monitor.    Problem: Adult Inpatient Plan of Care  Goal: Plan of Care Review  Outcome: Progressing  Goal: Patient-Specific Goal (Individualized)  Outcome: Progressing  Goal: Absence of Hospital-Acquired Illness or Injury  Outcome: Progressing  Intervention: Identify and Manage Fall Risk  Recent Flowsheet Documentation  Taken 12/07/2020 0400 by Sigmund Hazel, RN  Safety Interventions: isolation precautions  Taken 12/07/2020 0200 by Sigmund Hazel, RN  Safety Interventions: isolation precautions  Taken 12/07/2020 0000 by Sigmund Hazel, RN  Safety Interventions: isolation precautions  Taken 12/06/2020 2200 by Sigmund Hazel, RN  Safety Interventions: isolation precautions  Taken 12/06/2020 2000 by Sigmund Hazel, RN  Safety Interventions: isolation precautions  Intervention: Prevent and Manage VTE (Venous Thromboembolism) Risk  Recent Flowsheet Documentation  Taken 12/06/2020 2000 by Sigmund Hazel, RN  Activity Management: up ad lib  Goal: Optimal Comfort and Wellbeing  Outcome: Progressing  Goal: Readiness for Transition of Care  Outcome: Progressing  Goal: Rounds/Family Conference  Outcome: Progressing     Problem: Infection  Goal: Absence of Infection Signs and Symptoms  Outcome: Progressing  Intervention: Prevent or Manage Infection  Recent Flowsheet Documentation  Taken 12/07/2020 0400 by Sigmund Hazel, RN  Isolation Precautions: contact precautions maintained  Taken 12/07/2020 0200 by Sigmund Hazel, RN  Isolation Precautions: contact precautions maintained  Taken 12/07/2020 0000 by Sigmund Hazel, RN  Isolation Precautions: contact precautions maintained  Taken 12/06/2020 2000 by Sigmund Hazel, RN  Isolation Precautions: contact precautions maintained     Problem: Adjustment to Illness (Cystic Fibrosis)  Goal: Optimal Coping  Outcome: Progressing Problem: Infection (Cystic Fibrosis)  Goal: Absence of Infection Signs and Symptoms  Outcome: Progressing  Intervention: Manage Infection and Prevent Transmission  Recent Flowsheet Documentation  Taken 12/07/2020 0400 by Sigmund Hazel, RN  Isolation Precautions: contact precautions maintained  Taken 12/07/2020 0200 by Sigmund Hazel, RN  Isolation Precautions: contact precautions maintained  Taken 12/07/2020 0000 by Sigmund Hazel, RN  Isolation Precautions: contact precautions maintained  Taken 12/06/2020 2000 by Sigmund Hazel, RN  Isolation Precautions: contact precautions maintained     Problem: Oral Intake Inadequate (Cystic Fibrosis)  Goal: Optimal Nutrition Intake  Outcome: Progressing     Problem: Malabsorption (Cystic Fibrosis)  Goal: Optimal Bowel Elimination  Outcome: Progressing     Problem: Respiratory Compromise (Cystic Fibrosis)  Goal: Effective Oxygenation and Ventilation  Outcome: Progressing  Intervention: Optimize Oxygenation and Ventilation  Recent Flowsheet Documentation  Taken 12/06/2020 2000 by Sigmund Hazel, RN  Head of Bed [facility]) Positioning: [facility] elevated     Problem: Airway Clearance Ineffective  Goal: Effective Airway Clearance  Outcome: Progressing  Intervention: Promote Airway Secretion Clearance  Recent Flowsheet Documentation  Taken 12/06/2020 2000 by Sigmund Hazel, RN  Activity Management: up ad lib     Problem: Diabetes Comorbidity  Goal: Blood Glucose Level Within Targeted Range  Outcome: Progressing     Problem: Hypertension Comorbidity  Goal: Blood Pressure in Desired Range  Outcome: Progressing

## 2020-12-07 NOTE — Unmapped (Signed)
PULMONARY CONSULT  NOTE      Patient: Christian Young(Nov 23, 1985)  Reason for consultation: Mr. Stavros is a 35 y.o. male who is seen in consultation at the request of Kathryne Hitch, MD for comprehensive evaluation of CF exacerbation.    Assessment and Recommendations:      Principal Problem:    Cystic fibrosis with pulmonary exacerbation (CMS-HCC)  Active Problems:    Essential hypertension    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)    Pancreatic insufficiency due to cystic fibrosis (CMS-HCC)    Chronic deep vein thrombosis (DVT) of lower extremity (CMS-HCC)  Resolved Problems:    * No resolved hospital problems. *    Niv Darley is a 35 y.o. male with PMHx of CF who presented to [facility] with Cystic fibrosis with pulmonary exacerbation (CMS-HCC).  ??  1. Acute exacerbation of CF bronchiectasis: Has grown MSSA and 2 variants of PsA (one pan resistant and one with intermediate resistance), unable to produce sputum sample here. Spirometry 4/21 with FEV1 at baseline.  - Zosyn 4.5 mg q6h, tobramycin IV, pharmacy to dose, does BID dosing, Mg with BMP while on Tobramycin (twice weekly is fine). Will complete 14 days on Thursday 4/28 and will discharge home on 4/29.  - Continue Trikafta (non-formulary), Breo (formulary alternative for Symbicort) and Pulmozyme. At time of discharge, please reduce Pulmozyme frequency to once a day.  - Airway clearance with albuterol TID, 7% hypertonic saline, chest vest, and aerobika  - INR elevated, s/p vit K daily for 5 days, though suspecting influenced by Xarelto use. Now twice weekly while on IV abx.     2. CFRD: HgbA1c 10.7. Has [facility] Igo but would prefer to go back to [facility] for continuous readings. At home, was experiencing hypoglycemia in the AM. Fasting lipid panel notable for low HDL.  - Continue current insulin regimen. Consider splitting dose (50 units lantus) into two injections.  - Has outpatient endocrinology appointment - overdue and HgbA1c above goal despite intentional weight loss.      3. Port site pain after difficulty accessing: No prior history of difficulty with this port (placed 2-3 years ago here).  - Will have CF RN attempt port flush at his follow up visit. If unable to flush/access, will have port study done.    He has follow up scheduled with Dr. [name] for 6/27. Will need his annual labs done then.    We appreciate the opportunity to assist in the care of this patient.  Please page [phone] with any questions.    Viona Gilmore, MD MPH    Subjective:      History of Present Illness:  Mr. [name] is a 35 y.o. male admitted for CF exacerbation.  Has had recent pleuritic chest pain and fever.  Recommended by primary CF doc to present for admission and IV Abx.    Interval history 12/07/20: No overnight issues.  Port site is less painful as compared to yesterday. Already called by [facility] and scheduled for June. On wait list for earlier appointment.    Past medical, past surgical, family, and social histories and allergies reviewed from initial admission note and otherwise in Epic.     Review of Systems: A comprehensive review of systems was performed and was negative except as above in HPI.     Objective:      Physical Exam:  Vitals:    12/06/20 1146 12/06/20 1951 12/06/20 2009 12/07/20 0900   BP: 150/96 117/73  133/80   Pulse: 98 97  98   Resp: 18 18  18    Temp: 37 ??C (98.6 ??F) 36.7 ??C (98.1 ??F)  36.4 ??C (97.6 ??F)   TempSrc: Oral Oral  Oral   SpO2: 98% 97% 96% 98%   Weight:       Height:           Gen: Patient awake, alert, oriented to time place and person, no pallor, icterus,  cyanosis or clubbing.  Eyes: Pupils equal round and reacting light bilaterally  Head neck ENT: No JVD, no lymphadenopathy-cervical or supraclavicular, no thyromegaly, moist mucosa, normal oropharynx  CVS: S1-S2 heard normally, no murmurs appreciated, no rubs or gallops. No edema.  Respiratory: Air entry equal bilaterally without any crackles or wheeze  Abdomen: Soft, nontender, nondistended, no organomegaly, bowel sounds present  Neuro: Nonfocal neurological exam, no sensory deficits, cranial nerves grossly intact  Skin: Center of port with two pin-point spots of red where needle was inserted. No drainage or bleeding. No tenderness to palpation around port site. No fluctuance or swelling.     Malnutrition Assessment by RD:          Diagnostic Review:   All labs and images were personally reviewed.

## 2020-12-07 NOTE — Unmapped (Signed)
Pt progressing despite loss of port access and IV abx delay.  Provider states he is still on track for discharge.        Problem: Adult Inpatient Plan of Care  Goal: Plan of Care Review  Outcome: Progressing  Goal: Patient-Specific Goal (Individualized)  Outcome: Progressing  Goal: Absence of Hospital-Acquired Illness or Injury  Outcome: Progressing  Goal: Optimal Comfort and Wellbeing  Outcome: Progressing  Goal: Readiness for Transition of Care  Outcome: Progressing  Goal: Rounds/Family Conference  Outcome: Progressing     Problem: Infection  Goal: Absence of Infection Signs and Symptoms  Outcome: Progressing     Problem: Adjustment to Illness (Cystic Fibrosis)  Goal: Optimal Coping  Outcome: Progressing     Problem: Infection (Cystic Fibrosis)  Goal: Absence of Infection Signs and Symptoms  Outcome: Progressing     Problem: Oral Intake Inadequate (Cystic Fibrosis)  Goal: Optimal Nutrition Intake  Outcome: Progressing     Problem: Malabsorption (Cystic Fibrosis)  Goal: Optimal Bowel Elimination  Outcome: Progressing     Problem: Respiratory Compromise (Cystic Fibrosis)  Goal: Effective Oxygenation and Ventilation  Outcome: Progressing     Problem: Airway Clearance Ineffective  Goal: Effective Airway Clearance  Outcome: Progressing     Problem: Diabetes Comorbidity  Goal: Blood Glucose Level Within Targeted Range  Outcome: Progressing     Problem: Hypertension Comorbidity  Goal: Blood Pressure in Desired Range  Outcome: Progressing

## 2020-12-07 NOTE — Unmapped (Signed)
Hospitalist Daily Progress Note     LOS: 13 days       Assessment/Plan:  Principal Problem:    Cystic fibrosis with pulmonary exacerbation (CMS-HCC)  Active Problems:    Essential hypertension    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)    Pancreatic insufficiency due to cystic fibrosis (CMS-HCC)    Chronic deep vein thrombosis (DVT) of lower extremity (CMS-HCC)  Resolved Problems:    * No resolved hospital problems. *       Christian Young is a 35 y.o. male with PMHx of CF who presented to [facility] with Cystic fibrosis with pulmonary exacerbation.    Acute exacerbation of CF bronchiectasis: Admitted for 14d IV abx. Hx MSSA and 2 variants of PsA (one pan resistant and one with intermediate resistance). AFB smear negative, blood cultures negative. Pt unable to produce specimen for new culture. MRSA screen negative. PFTs 12/01/20 unchanged from prior. Clinically improving with IV antibiotic course.  - Pulmonology following  - Continue with IV Zosyn and Tobramycin, completing course on 4/28  - Monitor CMP, Mg, CBC daily  - Continue Trikafta (non-form), Breo (for Symbicort) and Pulmozyme  - Airway clearance with albuterol TID, 7% hypertonic saline, chest vest, and Aerobika  - Twice weekly VitK while on IV antibiotics    Pain at [employer]: Reported severe pain with flushing of the port yesterday. Site does not appear infected. Reports had same issue with port on R side before it had to be removed. Plan for outpatient port study.     Nausea: Resolved. Continue scheduled PO Zofran with antibiotics. Continue with PRN Compazine for breakthrough    CF Related Diabetes Mellitus: Blood sugars intermittently elevated.  - Lantus 50 units nightly, sliding scale     History of pSBO/ileus vs DIOS. Miralax TID, senna. Encourage daily BM goal    Pancreatic insufficiency due to CF: Per RD, Creon 12/meals, 6/snacks. ADEK vitamins    Hx DVT: Continue Xarelto 20. Provide vitamin K twice weekly while on IV abx. FEN/GI:  - High protein/high calorie diet  - No IVF  - Replete lytes PRN    PPx: Home rivaroxaban    Dispo: Will complete a 14 day course of antibiotics on 4/28, discharge planned for 4/29    Please page the [facility]) pager at [phone] with questions.    Subjective:   Yesterday afternoon, patient experienced significant pain with flushing of his port. Dressing was removed, there was some erythema around the border of the dressing and some over the port. However, the overlying skin on the port has become less red. He did have this same problem with his other port which he had to have removed.     A PIV was placed for the remainder of his antibiotic course. No issues with his IV antibiotics. Feeling well and no complaints.     Objective:   Vital signs in last 24 hours:  Temp:  [36.7 ??C (98.1 ??F)-37 ??C (98.6 ??F)] 36.7 ??C (98.1 ??F)  Heart Rate:  [97-98] 97  Resp:  [18] 18  BP: (117-150)/(73-96) 117/73  MAP (mmHg):  [89-109] 89  SpO2:  [96 %-98 %] 96 %    Labs personally reviewed    Gen: Sitting up in bed. Awake and alert. NAD. Non-toxic.  HEENT: EOMI, sclera anicteric. MMM.  Chest: Port access removed, some dermatitis at the borders of the prior dressing. Normal appearance of skin overlying port.   Resp: Normal WOB. Equal air movement  bilaterally. CTAB.  CV: RRR, normal s1s2, no m/g/r.   Abd: Soft, NTND, NABS.  Ext: WWP. No edema.

## 2020-12-08 LAB — COMPREHENSIVE METABOLIC PANEL
ALBUMIN: 3.9 g/dL (ref 3.4–5.0)
ALKALINE PHOSPHATASE: 61 U/L (ref 46–116)
ALT (SGPT): 26 U/L (ref 10–49)
ANION GAP: 7 mmol/L (ref 5–14)
AST (SGOT): 18 U/L (ref <=34)
BILIRUBIN TOTAL: 0.5 mg/dL (ref 0.3–1.2)
BLOOD UREA NITROGEN: 16 mg/dL (ref 9–23)
BUN / CREAT RATIO: 17
CALCIUM: 9.5 mg/dL (ref 8.7–10.4)
CHLORIDE: 100 mmol/L (ref 98–107)
CO2: 32.1 mmol/L — ABNORMAL HIGH (ref 20.0–31.0)
CREATININE: 0.94 mg/dL
EGFR CKD-EPI AA MALE: >90 mL/min/{1.73_m2} (ref >=60)
EGFR CKD-EPI NON-AA MALE: >90 mL/min/{1.73_m2} (ref >=60)
GLUCOSE RANDOM: 106 mg/dL (ref 70–179)
POTASSIUM: 4.6 mmol/L (ref 3.4–4.8)
PROTEIN TOTAL: 7.4 g/dL (ref 5.7–8.2)
SODIUM: 139 mmol/L (ref 135–145)

## 2020-12-08 LAB — CBC W/ AUTO DIFF
BASOPHILS ABSOLUTE COUNT: 0.1 10*9/L (ref 0.0–0.1)
BASOPHILS RELATIVE PERCENT: 1.4 %
EOSINOPHILS ABSOLUTE COUNT: 0.4 10*9/L (ref 0.0–0.5)
EOSINOPHILS RELATIVE PERCENT: 6.1 %
HEMATOCRIT: 40.9 % (ref 39.0–48.0)
HEMOGLOBIN: 13.6 g/dL (ref 12.9–16.5)
LYMPHOCYTES ABSOLUTE COUNT: 1.1 10*9/L (ref 1.1–3.6)
LYMPHOCYTES RELATIVE PERCENT: 18.9 %
MEAN CORPUSCULAR HEMOGLOBIN CONC: 33.1 g/dL (ref 32.0–36.0)
MEAN CORPUSCULAR HEMOGLOBIN: 28.9 pg (ref 25.9–32.4)
MEAN CORPUSCULAR VOLUME: 87.3 fL (ref 77.6–95.7)
MEAN PLATELET VOLUME: 7.4 fL (ref 6.8–10.7)
MONOCYTES ABSOLUTE COUNT: 0.9 10*9/L — ABNORMAL HIGH (ref 0.3–0.8)
MONOCYTES RELATIVE PERCENT: 15.5 %
NEUTROPHILS ABSOLUTE COUNT: 3.4 10*9/L (ref 1.8–7.8)
NEUTROPHILS RELATIVE PERCENT: 58.1 %
NUCLEATED RED BLOOD CELLS: 0 /100{WBCs} (ref <=4)
PLATELET COUNT: 269 10*9/L (ref 150–450)
RED BLOOD CELL COUNT: 4.69 10*12/L (ref 4.26–5.60)
RED CELL DISTRIBUTION WIDTH: 14.3 % (ref 12.2–15.2)
WBC ADJUSTED: 5.8 10*9/L (ref 3.6–11.2)

## 2020-12-08 LAB — MAGNESIUM: MAGNESIUM: 1.9 mg/dL (ref 1.6–2.6)

## 2020-12-08 MED ADMIN — dornase alfa (PULMOZYME) 1 mg/mL solution 2.5 mg: 2.5 mg | RESPIRATORY_TRACT

## 2020-12-08 MED ADMIN — albuterol nebulizer solution 2.5 mg: 2.5 mg | RESPIRATORY_TRACT | @ 12:00:00

## 2020-12-08 MED ADMIN — amitriptyline (ELAVIL) tablet 100 mg: 100 mg | ORAL | @ 01:00:00

## 2020-12-08 MED ADMIN — pramipexole (MIRAPEX) tablet 0.5 mg: .5 mg | ORAL | @ 21:00:00

## 2020-12-08 MED ADMIN — piperacillin-tazobactam (ZOSYN) IVPB (premix) 4.5 g: 4.5 g | INTRAVENOUS | @ 14:00:00 | Stop: 2020-12-09

## 2020-12-08 MED ADMIN — dornase alfa (PULMOZYME) 1 mg/mL solution 2.5 mg: 2.5 mg | RESPIRATORY_TRACT | @ 12:00:00

## 2020-12-08 MED ADMIN — insulin lispro (HumaLOG) injection 0-12 Units: 0-12 [IU] | SUBCUTANEOUS | @ 01:00:00

## 2020-12-08 MED ADMIN — sodium chloride 7% nebulizer solution 4 mL: 4 mL | RESPIRATORY_TRACT | @ 12:00:00

## 2020-12-08 MED ADMIN — piperacillin-tazobactam (ZOSYN) IVPB (premix) 4.5 g: 4.5 g | INTRAVENOUS | @ 20:00:00 | Stop: 2020-12-09

## 2020-12-08 MED ADMIN — fluticasone propionate (FLONASE) 50 mcg/actuation nasal spray 1 spray: 1 | NASAL | @ 14:00:00

## 2020-12-08 MED ADMIN — fluticasone furoate-vilanteroL (BREO ELLIPTA) 200-25 mcg/dose inhaler 1 puff: 1 | RESPIRATORY_TRACT | @ 12:00:00

## 2020-12-08 MED ADMIN — pancrelipase (Lip-Prot-Amyl) (CREON) 24,000-76,000 -120,000 unit delayed release capsule 288,000 units of lipase: 12 | ORAL | @ 14:00:00

## 2020-12-08 MED ADMIN — insulin glargine (LANTUS) injection 50 Units: 50 [IU] | SUBCUTANEOUS | @ 01:00:00

## 2020-12-08 MED ADMIN — melatonin tablet 10.5 mg: 10 mg | ORAL | @ 01:00:00

## 2020-12-08 MED ADMIN — ondansetron (ZOFRAN-ODT) disintegrating tablet 4 mg: 4 mg | ORAL | @ 14:00:00

## 2020-12-08 MED ADMIN — polyethylene glycol (MIRALAX) packet 17 g: 17 g | ORAL | @ 01:00:00

## 2020-12-08 MED ADMIN — cetirizine (ZyrTEC) tablet 10 mg: 10 mg | ORAL | @ 14:00:00

## 2020-12-08 MED ADMIN — atorvastatin (LIPITOR) tablet 20 mg: 20 mg | ORAL | @ 01:00:00

## 2020-12-08 MED ADMIN — piperacillin-tazobactam (ZOSYN) IVPB (premix) 4.5 g: 4.5 g | INTRAVENOUS | @ 01:00:00 | Stop: 2020-12-09

## 2020-12-08 MED ADMIN — gabapentin (NEURONTIN) capsule 600 mg: 600 mg | ORAL | @ 14:00:00

## 2020-12-08 MED ADMIN — famotidine (PEPCID) tablet 20 mg: 20 mg | ORAL | @ 14:00:00

## 2020-12-08 MED ADMIN — oxyCODONE (ROXICODONE) immediate release tablet 5 mg: 5 mg | ORAL | @ 01:00:00 | Stop: 2020-12-08

## 2020-12-08 MED ADMIN — ondansetron (ZOFRAN-ODT) disintegrating tablet 4 mg: 4 mg | ORAL | @ 01:00:00

## 2020-12-08 MED ADMIN — insulin lispro (HumaLOG) injection 0-12 Units: 0-12 [IU] | SUBCUTANEOUS | @ 21:00:00

## 2020-12-08 MED ADMIN — elexacaftor-tezacaftor-ivacaft (TRIKAFTA) tablet 2 tablet **Patient Supplied**: 2 | ORAL | @ 14:00:00

## 2020-12-08 MED ADMIN — lamoTRIgine (LaMICtal) tablet 200 mg: 200 mg | ORAL | @ 14:00:00

## 2020-12-08 MED ADMIN — FLUoxetine (PROzac) capsule 60 mg: 60 mg | ORAL | @ 14:00:00

## 2020-12-08 MED ADMIN — senna (SENOKOT) tablet 2 tablet: 2 | ORAL | @ 01:00:00

## 2020-12-08 MED ADMIN — sodium chloride 7% nebulizer solution 4 mL: 4 mL | RESPIRATORY_TRACT | @ 17:00:00

## 2020-12-08 MED ADMIN — elexacaftor-tezacaftor-ivacaft (TRIKAFTA) tablet 1 tablet: 1 | ORAL | @ 01:00:00

## 2020-12-08 MED ADMIN — piperacillin-tazobactam (ZOSYN) IVPB (premix) 4.5 g: 4.5 g | INTRAVENOUS | @ 07:00:00 | Stop: 2020-12-09

## 2020-12-08 MED ADMIN — magnesium oxide (MAG-OX) tablet 400 mg: 400 mg | ORAL | @ 01:00:00

## 2020-12-08 MED ADMIN — sodium chloride 7% nebulizer solution 4 mL: 4 mL | RESPIRATORY_TRACT

## 2020-12-08 MED ADMIN — traZODone (DESYREL) tablet 100 mg: 100 mg | ORAL | @ 01:00:00

## 2020-12-08 MED ADMIN — magnesium oxide (MAG-OX) tablet 400 mg: 400 mg | ORAL | @ 14:00:00

## 2020-12-08 MED ADMIN — pediatric multivitamin-vit D3 1,500 unit-vit K 800 mcg (MVW COMPLETE FORMULATION) capsule: 1 | ORAL | @ 14:00:00

## 2020-12-08 MED ADMIN — pediatric multivitamin-vit D3 1,500 unit-vit K 800 mcg (MVW COMPLETE FORMULATION) capsule: 1 | ORAL | @ 01:00:00

## 2020-12-08 MED ADMIN — rivaroxaban (XARELTO) tablet 20 mg: 20 mg | ORAL | @ 21:00:00

## 2020-12-08 MED ADMIN — hydrocortisone 2.5 % cream 1 application: 1 | TOPICAL | @ 01:00:00

## 2020-12-08 MED ADMIN — lisinopriL (PRINIVIL,ZESTRIL) tablet 10 mg: 10 mg | ORAL | @ 14:00:00

## 2020-12-08 MED ADMIN — pancrelipase (Lip-Prot-Amyl) (CREON) 24,000-76,000 -120,000 unit delayed release capsule 288,000 units of lipase: 12 | ORAL | @ 17:00:00

## 2020-12-08 MED ADMIN — albuterol nebulizer solution 2.5 mg: 2.5 mg | RESPIRATORY_TRACT | @ 17:00:00

## 2020-12-08 MED ADMIN — pancrelipase (Lip-Prot-Amyl) (CREON) 24,000-76,000 -120,000 unit delayed release capsule 288,000 units of lipase: 12 | ORAL | @ 21:00:00

## 2020-12-08 MED ADMIN — albuterol nebulizer solution 2.5 mg: 2.5 mg | RESPIRATORY_TRACT

## 2020-12-08 MED ADMIN — pantoprazole (PROTONIX) EC tablet 20 mg: 20 mg | ORAL | @ 14:00:00

## 2020-12-08 MED ADMIN — hydrocortisone 2.5 % cream 1 application: 1 | TOPICAL | @ 14:00:00

## 2020-12-08 NOTE — Unmapped (Signed)
Problem: Adult Inpatient Plan of Care  Goal: Plan of Care Review  Outcome: Progressing     Problem: Infection (Cystic Fibrosis)  Goal: Absence of Infection Signs and Symptoms  Outcome: Progressing     Problem: Malabsorption (Cystic Fibrosis)  Goal: Optimal Bowel Elimination  Outcome: Progressing     Problem: Airway Clearance Ineffective  Goal: Effective Airway Clearance  Outcome: Progressing     Problem: Diabetes Comorbidity  Goal: Blood Glucose Level Within Targeted Range  Outcome: Progressing   Patient remained on room air and scheduled nebs given as ordered.

## 2020-12-08 NOTE — Unmapped (Signed)
Pt alert and oriented x4. Pt in NAD, VSS. Pt reported headache this morning. PRN tylenol given with effect. IVABX given per order via PIV. No redness or inflammation noted to port site. Pt independent of care. Plan for pt to discharge Friday when IVABX course is complete.       Problem: Adult Inpatient Plan of Care  Goal: Plan of Care Review  Outcome: Progressing  Goal: Patient-Specific Goal (Individualized)  Outcome: Progressing  Goal: Absence of Hospital-Acquired Illness or Injury  Outcome: Progressing  Intervention: Identify and Manage Fall Risk  Recent Flowsheet Documentation  Taken 12/07/2020 0800 by Edythe Clarity, RN  Safety Interventions:   lighting adjusted for tasks/safety   isolation precautions  Goal: Optimal Comfort and Wellbeing  Outcome: Progressing  Goal: Readiness for Transition of Care  Outcome: Progressing  Goal: Rounds/Family Conference  Outcome: Progressing     Problem: Infection  Goal: Absence of Infection Signs and Symptoms  Outcome: Progressing  Intervention: Prevent or Manage Infection  Recent Flowsheet Documentation  Taken 12/07/2020 0800 by Edythe Clarity, RN  Isolation Precautions: contact precautions maintained     Problem: Adjustment to Illness (Cystic Fibrosis)  Goal: Optimal Coping  Outcome: Progressing     Problem: Infection (Cystic Fibrosis)  Goal: Absence of Infection Signs and Symptoms  Outcome: Progressing  Intervention: Manage Infection and Prevent Transmission  Recent Flowsheet Documentation  Taken 12/07/2020 0800 by Edythe Clarity, RN  Isolation Precautions: contact precautions maintained     Problem: Oral Intake Inadequate (Cystic Fibrosis)  Goal: Optimal Nutrition Intake  Outcome: Progressing     Problem: Malabsorption (Cystic Fibrosis)  Goal: Optimal Bowel Elimination  Outcome: Progressing     Problem: Respiratory Compromise (Cystic Fibrosis)  Goal: Effective Oxygenation and Ventilation  Outcome: Progressing     Problem: Airway Clearance Ineffective  Goal: Effective Airway Clearance  Outcome: Progressing     Problem: Diabetes Comorbidity  Goal: Blood Glucose Level Within Targeted Range  Outcome: Progressing     Problem: Hypertension Comorbidity  Goal: Blood Pressure in Desired Range  Outcome: Progressing

## 2020-12-08 NOTE — Unmapped (Signed)
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 *       Christian Young is a 35 y.o. male with PMHx of CF who presented to [facility] with Cystic fibrosis with pulmonary exacerbation.    Acute exacerbation of CF bronchiectasis: Admitted for 14d IV abx. Hx MSSA and 2 variants of PsA (one pan resistant and one with intermediate resistance). AFB smear negative, blood cultures negative. Pt unable to produce specimen for new culture. MRSA screen negative. PFTs 12/01/20 unchanged from prior. Clinically improving with IV antibiotic course.  - Pulmonology following  - Continue with IV Zosyn and Tobramycin, completing course on 4/29  - Monitor CMP, Mg, CBC daily  - Continue Trikafta (non-form), Breo (for Symbicort) and Pulmozyme  - Airway clearance with albuterol TID, 7% hypertonic saline, chest vest, and Aerobika  - Twice weekly VitK while on IV antibiotics  - No need to repeat PFTs at this time given no change at onset of exacerbation    Pain at [facility] Reported severe pain with flushing of the port yesterday. Site does not appear infected. Reports had same issue with port on R side before it had to be removed. Plan for outpatient port study.     Nausea: Resolved. Continue scheduled PO Zofran with antibiotics. Continue with PRN Compazine for breakthrough    CF Related Diabetes Mellitus: Blood sugars intermittently elevated.  - Lantus 50 units nightly, sliding scale     History of pSBO/ileus vs DIOS. Miralax TID, senna. Encourage daily BM goal    Pancreatic insufficiency due to CF: Per RD, Creon 12/meals, 6/snacks. ADEK vitamins    Hx DVT: Continue Xarelto 20. Provide vitamin K twice weekly while on IV abx.     FEN/GI:  - High protein/high calorie diet  - No IVF  - Replete lytes PRN    PPx: Home rivaroxaban    Dispo: Discharge planned for 4/29    Please page the [facility] [address] [facility]) pager at [phone] with questions.    Subjective:   Feeling well today. Rash from dressing is getting better with hydrocortisone. No hemoptysis. Still planning to DC tomorrow as he is back to his baseline from respiratory status. Appetite is good. No fever/chills.     Objective:   Vital signs in last 24 hours:  Temp:  [36.4 ??C (97.6 ??F)-36.7 ??C (98 ??F)] 36.7 ??C (98 ??F)  Heart Rate:  [83-98] 85  Resp:  [18] 18  BP: (120-133)/(64-86) 120/64  MAP (mmHg):  [83-98] 83  SpO2:  [98 %] 98 %    Labs personally reviewed    Gen: Sitting up in bed. Awake and alert. NAD. Non-toxic.  HEENT: EOMI, sclera anicteric. MMM.  Chest: Port site without erythema, induration, tenderness to palpation. Resolving dermatitis from prior dressing  Resp: Normal WOB. Equal air movement bilaterally. CTAB.  CV: RRR, normal s1s2, no m/g/r.   Abd: Soft, NTND, NABS.  Ext: WWP. No edema.

## 2020-12-08 NOTE — Unmapped (Signed)
NAD. POC continued.    Problem: Adult Inpatient Plan of Care  Goal: Plan of Care Review  Outcome: Progressing  Goal: Patient-Specific Goal (Individualized)  Outcome: Progressing  Goal: Absence of Hospital-Acquired Illness or Injury  Outcome: Progressing  Intervention: Identify and Manage Fall Risk  Recent Flowsheet Documentation  Taken 12/08/2020 0200 by Sigmund Hazel, RN  Safety Interventions: isolation precautions  Taken 12/08/2020 0000 by Sigmund Hazel, RN  Safety Interventions: isolation precautions  Taken 12/07/2020 2200 by Sigmund Hazel, RN  Safety Interventions: isolation precautions  Taken 12/07/2020 2000 by Sigmund Hazel, RN  Safety Interventions: isolation precautions  Intervention: Prevent and Manage VTE (Venous Thromboembolism) Risk  Recent Flowsheet Documentation  Taken 12/07/2020 2000 by Sigmund Hazel, RN  Activity Management: up ad lib  Goal: Optimal Comfort and Wellbeing  Outcome: Progressing  Goal: Readiness for Transition of Care  Outcome: Progressing  Goal: Rounds/Family Conference  Outcome: Progressing     Problem: Infection  Goal: Absence of Infection Signs and Symptoms  Outcome: Progressing  Intervention: Prevent or Manage Infection  Recent Flowsheet Documentation  Taken 12/08/2020 0200 by Sigmund Hazel, RN  Isolation Precautions: contact precautions maintained  Taken 12/08/2020 0000 by Sigmund Hazel, RN  Isolation Precautions: contact precautions maintained  Taken 12/07/2020 2200 by Sigmund Hazel, RN  Isolation Precautions: contact precautions maintained  Taken 12/07/2020 2000 by Sigmund Hazel, RN  Isolation Precautions: contact precautions maintained     Problem: Adjustment to Illness (Cystic Fibrosis)  Goal: Optimal Coping  Outcome: Progressing     Problem: Infection (Cystic Fibrosis)  Goal: Absence of Infection Signs and Symptoms  Outcome: Progressing  Intervention: Manage Infection and Prevent Transmission  Recent Flowsheet Documentation  Taken 12/08/2020 0200 by Sigmund Hazel, RN  Isolation Precautions: contact precautions maintained  Taken 12/08/2020 0000 by Sigmund Hazel, RN  Isolation Precautions: contact precautions maintained  Taken 12/07/2020 2200 by Sigmund Hazel, RN  Isolation Precautions: contact precautions maintained  Taken 12/07/2020 2000 by Sigmund Hazel, RN  Isolation Precautions: contact precautions maintained     Problem: Oral Intake Inadequate (Cystic Fibrosis)  Goal: Optimal Nutrition Intake  Outcome: Progressing     Problem: Malabsorption (Cystic Fibrosis)  Goal: Optimal Bowel Elimination  Outcome: Progressing     Problem: Respiratory Compromise (Cystic Fibrosis)  Goal: Effective Oxygenation and Ventilation  Outcome: Progressing  Intervention: Optimize Oxygenation and Ventilation  Recent Flowsheet Documentation  Taken 12/07/2020 2000 by Sigmund Hazel, RN  Head of Bed [facility]) Positioning: [facility] elevated     Problem: Airway Clearance Ineffective  Goal: Effective Airway Clearance  Outcome: Progressing  Intervention: Promote Airway Secretion Clearance  Recent Flowsheet Documentation  Taken 12/07/2020 2000 by Sigmund Hazel, RN  Activity Management: up ad lib     Problem: Diabetes Comorbidity  Goal: Blood Glucose Level Within Targeted Range  Outcome: Progressing     Problem: Hypertension Comorbidity  Goal: Blood Pressure in Desired Range  Outcome: Progressing

## 2020-12-09 LAB — CBC W/ AUTO DIFF
BASOPHILS ABSOLUTE COUNT: 0 10*9/L (ref 0.0–0.1)
BASOPHILS RELATIVE PERCENT: 0.8 %
EOSINOPHILS ABSOLUTE COUNT: 0.3 10*9/L (ref 0.0–0.5)
EOSINOPHILS RELATIVE PERCENT: 5.2 %
HEMATOCRIT: 44.5 % (ref 39.0–48.0)
HEMOGLOBIN: 14.9 g/dL (ref 12.9–16.5)
LYMPHOCYTES ABSOLUTE COUNT: 1.2 10*9/L (ref 1.1–3.6)
LYMPHOCYTES RELATIVE PERCENT: 20.3 %
MEAN CORPUSCULAR HEMOGLOBIN CONC: 33.5 g/dL (ref 32.0–36.0)
MEAN CORPUSCULAR HEMOGLOBIN: 28.7 pg (ref 25.9–32.4)
MEAN CORPUSCULAR VOLUME: 85.7 fL (ref 77.6–95.7)
MEAN PLATELET VOLUME: 7.4 fL (ref 6.8–10.7)
MONOCYTES ABSOLUTE COUNT: 0.7 10*9/L (ref 0.3–0.8)
MONOCYTES RELATIVE PERCENT: 12.3 %
NEUTROPHILS ABSOLUTE COUNT: 3.5 10*9/L (ref 1.8–7.8)
NEUTROPHILS RELATIVE PERCENT: 61.4 %
NUCLEATED RED BLOOD CELLS: 0 /100{WBCs} (ref <=4)
PLATELET COUNT: 293 10*9/L (ref 150–450)
RED BLOOD CELL COUNT: 5.19 10*12/L (ref 4.26–5.60)
RED CELL DISTRIBUTION WIDTH: 14.3 % (ref 12.2–15.2)
WBC ADJUSTED: 5.7 10*9/L (ref 3.6–11.2)

## 2020-12-09 LAB — COMPREHENSIVE METABOLIC PANEL
ALBUMIN: 4.4 g/dL (ref 3.4–5.0)
ALKALINE PHOSPHATASE: 71 U/L (ref 46–116)
ALT (SGPT): 30 U/L (ref 10–49)
ANION GAP: 8 mmol/L (ref 5–14)
AST (SGOT): 21 U/L (ref <=34)
BILIRUBIN TOTAL: 0.6 mg/dL (ref 0.3–1.2)
BLOOD UREA NITROGEN: 17 mg/dL (ref 9–23)
BUN / CREAT RATIO: 20
CALCIUM: 9.7 mg/dL (ref 8.7–10.4)
CHLORIDE: 100 mmol/L (ref 98–107)
CO2: 29 mmol/L (ref 20.0–31.0)
CREATININE: 0.85 mg/dL
EGFR CKD-EPI AA MALE: >90 mL/min/{1.73_m2} (ref >=60)
EGFR CKD-EPI NON-AA MALE: >90 mL/min/{1.73_m2} (ref >=60)
GLUCOSE RANDOM: 85 mg/dL (ref 70–179)
POTASSIUM: 4.1 mmol/L (ref 3.4–4.8)
PROTEIN TOTAL: 8.2 g/dL (ref 5.7–8.2)
SODIUM: 137 mmol/L (ref 135–145)

## 2020-12-09 LAB — MAGNESIUM: MAGNESIUM: 1.9 mg/dL (ref 1.6–2.6)

## 2020-12-09 MED ORDER — TRAZODONE 100 MG TABLET
ORAL_TABLET | Freq: Every evening | ORAL | 0 refills | 30 days
Start: 2020-12-09 — End: 2021-01-08

## 2020-12-09 MED ORDER — FLUTICASONE PROPIONATE 50 MCG/ACTUATION NASAL SPRAY,SUSPENSION
Freq: Every day | NASAL | 0 days
Start: 2020-12-09 — End: 2021-12-09

## 2020-12-09 MED ORDER — DORNASE ALFA 1 MG/ML SOLUTION FOR INHALATION
Freq: Every day | RESPIRATORY_TRACT | 3 refills | 180 days
Start: 2020-12-09 — End: 2021-12-09

## 2020-12-09 MED ADMIN — dornase alfa (PULMOZYME) 1 mg/mL solution 2.5 mg: 2.5 mg | RESPIRATORY_TRACT

## 2020-12-09 MED ADMIN — amitriptyline (ELAVIL) tablet 100 mg: 100 mg | ORAL | @ 01:00:00

## 2020-12-09 MED ADMIN — elexacaftor-tezacaftor-ivacaft (TRIKAFTA) tablet 1 tablet: 1 | ORAL | @ 01:00:00

## 2020-12-09 MED ADMIN — acetaminophen (TYLENOL) tablet 650 mg: 650 mg | ORAL | @ 01:00:00

## 2020-12-09 MED ADMIN — sodium chloride 7% nebulizer solution 4 mL: 4 mL | RESPIRATORY_TRACT | @ 12:00:00 | Stop: 2020-12-09

## 2020-12-09 MED ADMIN — albuterol nebulizer solution 2.5 mg: 2.5 mg | RESPIRATORY_TRACT | @ 12:00:00 | Stop: 2020-12-09

## 2020-12-09 MED ADMIN — fluticasone furoate-vilanteroL (BREO ELLIPTA) 200-25 mcg/dose inhaler 1 puff: 1 | RESPIRATORY_TRACT | @ 12:00:00 | Stop: 2020-12-09

## 2020-12-09 MED ADMIN — albuterol nebulizer solution 2.5 mg: 2.5 mg | RESPIRATORY_TRACT

## 2020-12-09 MED ADMIN — insulin glargine (LANTUS) injection 50 Units: 50 [IU] | SUBCUTANEOUS | @ 01:00:00

## 2020-12-09 MED ADMIN — melatonin tablet 10.5 mg: 10 mg | ORAL | @ 01:00:00

## 2020-12-09 MED ADMIN — sodium chloride 7% nebulizer solution 4 mL: 4 mL | RESPIRATORY_TRACT

## 2020-12-09 MED ADMIN — piperacillin-tazobactam (ZOSYN) IVPB (premix) 4.5 g: 4.5 g | INTRAVENOUS | @ 01:00:00 | Stop: 2020-12-09

## 2020-12-09 MED ADMIN — pediatric multivitamin-vit D3 1,500 unit-vit K 800 mcg (MVW COMPLETE FORMULATION) capsule: 1 | ORAL | @ 01:00:00

## 2020-12-09 MED ADMIN — hydrocortisone 2.5 % cream 1 application: 1 | TOPICAL | @ 01:00:00

## 2020-12-09 MED ADMIN — magnesium oxide (MAG-OX) tablet 400 mg: 400 mg | ORAL | @ 01:00:00

## 2020-12-09 MED ADMIN — ondansetron (ZOFRAN-ODT) disintegrating tablet 4 mg: 4 mg | ORAL | @ 01:00:00

## 2020-12-09 MED ADMIN — traZODone (DESYREL) tablet 100 mg: 100 mg | ORAL | @ 01:00:00

## 2020-12-09 MED ADMIN — senna (SENOKOT) tablet 2 tablet: 2 | ORAL | @ 01:00:00

## 2020-12-09 MED ADMIN — polyethylene glycol (MIRALAX) packet 17 g: 17 g | ORAL | @ 01:00:00

## 2020-12-09 MED ADMIN — atorvastatin (LIPITOR) tablet 20 mg: 20 mg | ORAL | @ 01:00:00

## 2020-12-09 NOTE — Unmapped (Signed)
Problem: Adult Inpatient Plan of Care  Goal: Plan of Care Review  Outcome: Progressing     Problem: Infection (Cystic Fibrosis)  Goal: Absence of Infection Signs and Symptoms  Outcome: Progressing     Problem: Oral Intake Inadequate (Cystic Fibrosis)  Goal: Optimal Nutrition Intake  Outcome: Progressing     Problem: Respiratory Compromise (Cystic Fibrosis)  Goal: Effective Oxygenation and Ventilation  Outcome: Progressing     Problem: Airway Clearance Ineffective  Goal: Effective Airway Clearance  Outcome: Progressing   Patient received all scheduled meds and completed home chest vest

## 2020-12-09 NOTE — Unmapped (Signed)
Patient received all neb tx as scheduled with no adverse reaction. Patient using home chest vest for airway clearance. No respiratory distress noted

## 2020-12-09 NOTE — Unmapped (Signed)
NAD. Prn tylenol given for mild HA and effective. Anticipating discharge. Will continue to monitor.    Problem: Adult Inpatient Plan of Care  Goal: Plan of Care Review  Outcome: Progressing  Goal: Patient-Specific Goal (Individualized)  Outcome: Progressing  Goal: Absence of Hospital-Acquired Illness or Injury  Outcome: Progressing  Goal: Optimal Comfort and Wellbeing  Outcome: Progressing  Goal: Readiness for Transition of Care  Outcome: Progressing  Goal: Rounds/Family Conference  Outcome: Progressing     Problem: Infection  Goal: Absence of Infection Signs and Symptoms  Outcome: Progressing     Problem: Adjustment to Illness (Cystic Fibrosis)  Goal: Optimal Coping  Outcome: Progressing     Problem: Infection (Cystic Fibrosis)  Goal: Absence of Infection Signs and Symptoms  Outcome: Progressing     Problem: Oral Intake Inadequate (Cystic Fibrosis)  Goal: Optimal Nutrition Intake  Outcome: Progressing     Problem: Malabsorption (Cystic Fibrosis)  Goal: Optimal Bowel Elimination  Outcome: Progressing     Problem: Respiratory Compromise (Cystic Fibrosis)  Goal: Effective Oxygenation and Ventilation  Outcome: Progressing     Problem: Airway Clearance Ineffective  Goal: Effective Airway Clearance  Outcome: Progressing     Problem: Diabetes Comorbidity  Goal: Blood Glucose Level Within Targeted Range  Outcome: Progressing     Problem: Hypertension Comorbidity  Goal: Blood Pressure in Desired Range  Outcome: Progressing

## 2020-12-09 NOTE — Unmapped (Signed)
Pt alert and oriented x4. Pt in NAD, VSS. IVABX given per order. Pt to dc tomorro.      Problem: Adult Inpatient Plan of Care  Goal: Plan of Care Review  Outcome: Progressing  Goal: Patient-Specific Goal (Individualized)  Outcome: Progressing  Goal: Absence of Hospital-Acquired Illness or Injury  Outcome: Progressing  Intervention: Identify and Manage Fall Risk  Recent Flowsheet Documentation  Taken 12/08/2020 0800 by Edythe Clarity, RN  Safety Interventions:   lighting adjusted for tasks/safety   fall reduction program maintained  Goal: Optimal Comfort and Wellbeing  Outcome: Progressing  Goal: Readiness for Transition of Care  Outcome: Progressing  Goal: Rounds/Family Conference  Outcome: Progressing     Problem: Infection  Goal: Absence of Infection Signs and Symptoms  Outcome: Progressing  Intervention: Prevent or Manage Infection  Recent Flowsheet Documentation  Taken 12/08/2020 0800 by Edythe Clarity, RN  Isolation Precautions: contact precautions maintained     Problem: Adjustment to Illness (Cystic Fibrosis)  Goal: Optimal Coping  Outcome: Progressing     Problem: Infection (Cystic Fibrosis)  Goal: Absence of Infection Signs and Symptoms  Outcome: Progressing  Intervention: Manage Infection and Prevent Transmission  Recent Flowsheet Documentation  Taken 12/08/2020 0800 by Edythe Clarity, RN  Isolation Precautions: contact precautions maintained     Problem: Oral Intake Inadequate (Cystic Fibrosis)  Goal: Optimal Nutrition Intake  Outcome: Progressing     Problem: Malabsorption (Cystic Fibrosis)  Goal: Optimal Bowel Elimination  Outcome: Progressing     Problem: Respiratory Compromise (Cystic Fibrosis)  Goal: Effective Oxygenation and Ventilation  Outcome: Progressing     Problem: Airway Clearance Ineffective  Goal: Effective Airway Clearance  Outcome: Progressing     Problem: Diabetes Comorbidity  Goal: Blood Glucose Level Within Targeted Range  Outcome: Progressing     Problem: Hypertension Comorbidity  Goal: Blood Pressure in Desired Range  Outcome: Progressing

## 2020-12-09 NOTE — Unmapped (Signed)
Physician Discharge Summary [facility]BR  1 BT[facility]  [address]  [place] [address]  Dept: [phone]  Loc: [phone]     Identifying Information:   Christian Young  1986-04-12  [id number]    Primary Care Physician: [facility]     Code Status: Full Code    Admit Date: 11/24/2020    Discharge Date: 12/09/2020     Discharge To: Home    Discharge Service: HBR - HBC: Hospitalist Service #2     Discharge Attending Physician: Kathryne Hitch, MD    Discharge Diagnoses:  Principal Problem:    Cystic fibrosis with pulmonary exacerbation (CMS-HCC) POA: Yes  Active Problems:    Essential hypertension POA: Yes    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) POA: Yes    Pancreatic insufficiency due to cystic fibrosis (CMS-HCC) POA: Yes    Chronic deep vein thrombosis (DVT) of lower extremity (CMS-HCC) POA: Yes  Resolved Problems:    * No resolved hospital problems. *      Outpatient Provider Follow Up Issues:   [ ]  Endocrinology appointment for diabetes management  [ ]  Port study if still having pain with flushing of the port    Hospital Course:   Christian Young is a 34yoM with CF admitted with Cystic fibrosis with pulmonary exacerbation manifested as fever, cough and chest pain. He was sent as direct admission by his primary pulmonologist for course of IV abx which was completed inpatient due to inadequate home support. Hospital course as outlined:     Acute exacerbation of CF bronchiectasis: Admitted for 14d IV abx. Hx MSSA and 2 variants of PsA (one pan resistant and one with intermediate resistance). AFB smear negative, blood cultures negative. Pt unable to produce specimen for new culture.  MRSA screen negative. [facility] Pulmonology consult provided input throughout hospitalization. Provided Zosyn 4.5 mg q6h, Tobramycin IV per Pharmacy levels/dosing. Labs (cbc/bmp/mg) were monitored daily as noted that pt has cefepime allergy and developed AIN from Zosyn in past. Nausea r/t abx managed w/ zofran and compazine. Continued home CF/airway clearance meds including Trikafta (non-form), Breo (for Symbicort) and Pulmozyme, airway clearance with albuterol TID, 7% hypertonic saline, chest vest, and aerobika. PFTs 12/01/20 unchanged from prior. Pt completed 14d IV abx on 4/29.      Pain at [facility]: Reported severe pain with flushing of the port yesterday. Site does not appear infected. Reports had same issue with port on R side before it had to be removed. Plan for flushign his port while in clinic, and if still painful he will have an outpatient port study. If no pain, study can be cancelled.      CF Related Diabetes Mellitus: Poorly controlled. A1c 10.7% at admission. Blood sugars intermittently elevated, but was better controlled during admission. He was continued on Lantus 50 units nightly. An appointment was made in endocrinology clinic for follow-up.     Procedures:  No admission procedures for hospital encounter.  ______________________________________________________________________  Discharge Medications:     Your Medication List      CHANGE how you take these medications    cetirizine 10 MG tablet  Commonly known as: ZyrTEC  TAKE ONE TABLET BY MOUTH AT LUNCH  What changed: See the new instructions.     dornase alfa 1 mg/mL nebulizer solution  Commonly known as: PULMOZYME  Inhale 2.5 mg daily.  What changed: when to take this     fluticasone propionate 50 mcg/actuation nasal spray  Commonly  known as: FLONASE  1 spray into each nostril daily.  What changed: See the new instructions.     insulin glargine 100 unit/mL (3 mL) injection pen  Commonly known as: BASAGLAR, LANTUS  Inject 0.7 mL (70 Units total) under the skin daily.  What changed:   ?? how much to take  ?? when to take this     MVW COMPLETE FORMUL PROBIOTIC 40 billion cell -15 mg Cpdr  Generic drug: Lacto-Bif-Sac-Bacil-Strep-bact  Take 1 capsule by mouth daily.  What changed: when to take this        CONTINUE taking these medications    albuterol 90 mcg/actuation inhaler  Commonly known as: PROVENTIL HFA;VENTOLIN HFA  Inhale 2 puffs every six (6) hours as needed.     albuterol 2.5 mg/0.5 mL nebulizer solution  Inhale 0.5 mL (2.5 mg total) by nebulization Two (2) times a day.     amitriptyline 100 MG tablet  Commonly known as: ELAVIL  TAKE ONE TABLET BY MOUTH AT BEDTIME     atorvastatin 20 MG tablet  Commonly known as: LIPITOR  Take 20 mg by mouth nightly.     BETHKIS 300 mg/4 mL Nebu  Generic drug: tobramycin  Inhale 1 vial via nebulization every 12 hours for 28 days, alternating every other 28 days     blood-glucose meter kit  Dispense meter that is preferred by patient's insurance company     budesonide-formoteroL 160-4.5 mcg/actuation inhaler  Commonly known as: SYMBICORT  Inhale 2 puffs Two (2) times a day.     CREON 36,000-114,000- 180,000 unit Cpdr  Generic drug: lipase-protease-amylase  TAKE 8 CAPSULES BY MOUTH 3 TIMES DAILY WITH MEALS AND TAKE 4 CAPSULES TWICE DAILY WITH SNACKS *MAY TAKE UP TO 1 EXTRA SNACK DAILY. MAX 36 PER DAY*     DEXCOM G6 RECEIVER Misc  Generic drug: blood-glucose meter,continuous  Use as directed     DEXCOM G6 SENSOR Devi  Generic drug: blood-glucose sensor  Use sq as directed every 14 days     DEXCOM G6 TRANSMITTER Devi  Generic drug: blood-glucose transmitter  Use as directed     EASY TOUCH LANCING DEVICE Misc  Generic drug: lancing device  Use as directed.     EASY TOUCH TWIST LANCETS 30 gauge Misc  Generic drug: lancets  Use as directed.     FLUoxetine 60 mg Tab  Take 60 mg by mouth daily.     gabapentin 600 mg Tb24  Take 600 mg by mouth daily.     glucose blood Strp  Generic drug: blood sugar diagnostic  by Other route Three (3) times a day with a meal. Rx sent to [facility] 01/04/20     insulin ASPART 100 unit/mL (3 mL) injection pen  Commonly known as: NovoLOG FLEXPEN  Use up to 150 units/day, divided 3 TIMES DAILY BEFORE MEALS     lamoTRIgine 200 MG tablet  Commonly known as: LaMICtal  Take 200 mg by mouth daily.     LC PLUS Misc  Generic drug: nebulizers  Use as directed with inhaled medications     lisinopriL 10 MG tablet  Commonly known as: PRINIVIL,ZESTRIL  Take 1 tablet (10 mg total) by mouth daily.     melatonin 10 mg Tab  Take 10 mg by mouth nightly.     montelukast 10 mg tablet  Commonly known as: SINGULAIR  TAKE ONE TABLET BY MOUTH AT BEDTIME     MVW Complete (pediatric multivit 61-D3-vit K) 1,500-800 unit-mcg Cap  Commonly known as: MVW COMPLETE FORMULATION  Take 1 capsule by mouth Two (2) times a day.     omeprazole 20 MG capsule  Commonly known as: PriLOSEC  Take 20 mg by mouth daily.     pen needle, diabetic 32 gauge x 5/32 (4 mm) Ndle  Commonly known as: BD ULTRA-FINE NANO PEN NEEDLE  use up to 4 times daily     pramipexole 0.125 MG tablet  Commonly known as: MIRAPEX  Take 0.25 mg by mouth every evening.     sodium chloride 7% 7 % Nebu  Inhale 4 mL by nebulization Two (2) times a day. Increase to 4 times while taking antibiotics     traZODone 100 MG tablet  Commonly known as: DESYREL  Take 1 tablet (100 mg total) by mouth nightly.     TRIKAFTA 100-50-75 mg(d) /150 mg (n) tablet  Generic drug: elexacaftor-tezacaftor-ivacaft  Take 2 Tablets (orange) by mouth in the morning and 1 tablet (blue) in the evening with fatty food     XARELTO 20 mg tablet  Generic drug: rivaroxaban  Take 20 mg by mouth daily.            Allergies:  Aztreonam, [place] [aztreonam lysine], Cefepime, Other, Slo-bid 100, Tobramycin, and Banana  ______________________________________________________________________  Pending Test Results (if blank, then none):  Pending Labs     Order Current Status    Comprehensive Metabolic Panel In process    Magnesium Level In process        Most Recent Labs:  All lab results last 24 hours -   Recent Results (from the past 24 hour(s))   POCT Glucose    Collection Time: 12/08/20  4:45 PM   Result Value Ref Range    Glucose, POC 205 (H) 70 - 179 mg/dL   POCT Glucose    Collection Time: 12/08/20  8:42 PM   Result Value Ref Range    Glucose, POC 134 70 - 179 mg/dL   POCT Glucose    Collection Time: 12/09/20  7:43 AM   Result Value Ref Range    Glucose, POC 84 70 - 179 mg/dL   Comprehensive Metabolic Panel    Collection Time: 12/09/20  8:10 AM   Result Value Ref Range    Sodium 137 135 - 145 mmol/L    Potassium 4.1 3.4 - 4.8 mmol/L    Chloride 100 98 - 107 mmol/L    Anion Gap 8 5 - 14 mmol/L    CO2 29.0 20.0 - 31.0 mmol/L    BUN 17 9 - 23 mg/dL    Creatinine 2.95 6.21 - 1.10 mg/dL    BUN/Creatinine Ratio 20     EGFR CKD-EPI Non-African American, Male >90 >=60 mL/min/1.33m2    EGFR CKD-EPI African American, Male >90 >=60 mL/min/1.36m2    Glucose 85 70 - 179 mg/dL    Calcium 9.7 8.7 - 30.8 mg/dL    Albumin 4.4 3.4 - 5.0 g/dL    Total Protein 8.2 5.7 - 8.2 g/dL    Total Bilirubin 0.6 0.3 - 1.2 mg/dL    AST 21 <=65 U/L    ALT 30 10 - 49 U/L    Alkaline Phosphatase 71 46 - 116 U/L   Magnesium Level    Collection Time: 12/09/20  8:10 AM   Result Value Ref Range    Magnesium 1.9 1.6 - 2.6 mg/dL   CBC w/ Differential    Collection Time: 12/09/20  8:10 AM   Result Value Ref  Range    WBC 5.7 3.6 - 11.2 10*9/L    RBC 5.19 4.26 - 5.60 10*12/L    HGB 14.9 12.9 - 16.5 g/dL    HCT 16.1 09.6 - 04.5 %    MCV 85.7 77.6 - 95.7 fL    MCH 28.7 25.9 - 32.4 pg    MCHC 33.5 32.0 - 36.0 g/dL    RDW 40.9 81.1 - 91.4 %    MPV 7.4 6.8 - 10.7 fL    Platelet 293 150 - 450 10*9/L    nRBC 0 <=4 /100 WBCs    Neutrophils % 61.4 %    Lymphocytes % 20.3 %    Monocytes % 12.3 %    Eosinophils % 5.2 %    Basophils % 0.8 %    Absolute Neutrophils 3.5 1.8 - 7.8 10*9/L    Absolute Lymphocytes 1.2 1.1 - 3.6 10*9/L    Absolute Monocytes 0.7 0.3 - 0.8 10*9/L    Absolute Eosinophils 0.3 0.0 - 0.5 10*9/L    Absolute Basophils 0.0 0.0 - 0.1 10*9/L       Relevant Studies/Radiology (if blank, then none):  XR Chest Portable    Result Date: 11/24/2020  EXAM: XR CHEST PORTABLE DATE: 11/24/2020 3:15 PM ACCESSION: [id number] UN DICTATED: 11/24/2020 3:14 PM INTERPRETATION LOCATION: [facility] CLINICAL INDICATION: 35 years old Male with COUGH  COMPARISON: 06/28/2020 chest radiograph. TECHNIQUE: Portable Chest Radiograph. FINDINGS: Left chest wall Port-A-Cath tip terminates over the right atrium. Sequela of right upper lobectomy and partial right sixth rib resection. Mildly hypoinflated lungs. No focal consolidation or pulmonary edema. Question trace right pleural effusion versus scarring. No evidence of pneumothorax. Stable cardiomediastinal silhouette.     No evidence of acute cardiopulmonary pathology.     XR Chest 2 views    Result Date: 11/27/2020  EXAM: XR CHEST 2 VIEWS DATE: 11/26/2020 9:26 PM ACCESSION: [id number] UN DICTATED: 11/27/2020 2:35 AM INTERPRETATION LOCATION: [facility] CLINICAL INDICATION: 35 years old Male with CYSTIC FIBROSIS  COMPARISON: 11/26/2020 PA and lateral chest radiograph TECHNIQUE: PA and Lateral Chest Radiographs. FINDINGS: Similar appearance of accessed left tunneled port catheter with tip projecting over the right atrium. Scarring at the apex of the right lung. The previously described small right apical pneumothorax is not confirmed on this study. Right apical parenchymal scarring in this patient with history of upper lobectomy on the right and thoracotomy changes. Right hilar surgical skin clips. No pleural effusion or pneumothorax. Unremarkable cardiomediastinal silhouette. Stable mild elevation of the right hemidiaphragm.     No right apical pneumothorax is identified on follow-up imaging.    XR Chest 2 views    Result Date: 11/26/2020  EXAM: XR CHEST 2 VIEWS DATE: 11/26/2020 3:45 PM ACCESSION: [id number] UN DICTATED: 11/26/2020 4:41 PM INTERPRETATION LOCATION: [facility] CLINICAL INDICATION: 35 years old Male with CHEST PAIN  COMPARISON: 11/24/2020 PA and lateral chest radiograph TECHNIQUE: PA and Lateral Chest Radiographs. FINDINGS: Accessed left chest port with catheter tip projecting over the right atrium. Stable findings of right apical scarring following right upper lobectomy. Mild elevation of the right hemidiaphragm. No sizable pleural effusion. Small right apical pneumothorax. Unremarkable cardiomediastinal silhouette. Stable mild elevation of the right hemidiaphragm     Small right apical pneumothorax     XR Chest 2 views    Result Date: 11/25/2020  EXAM: XR CHEST 2 VIEWS DATE: 11/24/2020 10:40 PM ACCESSION: [id number] UN DICTATED: 11/25/2020 8:34 AM INTERPRETATION LOCATION: [facility] CLINICAL INDICATION: 35 years old Male with CYSTIC FIBROSIS  COMPARISON: Same day  chest radiograph timed 3:09 PM TECHNIQUE: PA and Lateral Chest Radiographs. CONCLUSIONS: Unchanged left chest port with tip in the right atrium. Unchanged sequelae of right upper lobectomy with chronic scarring in the right lung base. No same-day interval change.     ______________________________________________________________________  Discharge Instructions:     Diet Instructions     Discharge diet (specify)      Discharge Nutrition Therapy: Regular          Follow Up instructions and Outpatient Referrals     Call MD for:  difficulty breathing, headache or visual disturbances      Call MD for:  persistent dizziness or light-headedness      Call MD for:  persistent nausea or vomiting      Call MD for:  severe uncontrolled pain      Call MD for:  temperature >38.5 Celsius      Discharge instructions          Other Instructions     Call MD for:  difficulty breathing, headache or visual disturbances      Call MD for:  persistent dizziness or light-headedness      Call MD for:  persistent nausea or vomiting      Call MD for:  severe uncontrolled pain      Call MD for:  temperature >38.5 Celsius      Discharge instructions      You were admitted to the hospital for a CF exacerbation. You have completed a 2 week course of antibiotics and do not need to take any more antibiotics at home. Please continue to perform your airway clearance at home and take your medications as prescribed. We have arranged an appointment with endocrinology to help get your blood sugars under better control. We are also working to get a port study arranged to make sure that there aren't any problem with your port.          Appointments which have been scheduled for you    Feb 03, 2021 10:20 AM  (Arrive by 10:05 AM)  RETURN  DIABETES with Harrie Foreman, MD  [facility] DIABETES AND ENDOCRINOLOGY [place] [facility] REG[facility]) [address]  [place] [address]  [phone]      Feb 06, 2021  1:15 PM  (Arrive by 1:00 PM)  RETURN PFT 15 with PFT 1  [facility] PULMONARY SPECIALTY FUNCT [place] [facility] REGION) [address]  [place] [address]  [phone]      Feb 06, 2021  1:30 PM  (Arrive by 1:15 PM)  RETURN CYSTIC FIBROSIS with Verlin Fester, PA  [facility] PULMONARY SPECIALTY CL [place] [facility] REGION) [address]  [place] [address]  [phone]           ______________________________________________________________________  Discharge Day Services:  BP 137/87  - Pulse 76  - Temp 37 ??C (98.6 ??F) (Oral)  - Resp 18  - Ht 182.9 cm (6' 0.01)  - Wt (!) 101.3 kg (223 lb 4.8 oz)  - SpO2 100%  - BMI 30.28 kg/m??   Pt seen on the day of discharge and determined appropriate for discharge.    Condition at Discharge: good    Length of Discharge: I spent greater than 30 mins in the discharge of this patient.

## 2020-12-12 NOTE — Unmapped (Signed)
[facility]    Home Spirometry Monitoring      12/12/20        I have reviewed 1 home spirometry test(s) over prior calendar month.  Most recent test demonstrates moderate obstruction and mild restriction.  Compared to prior home spirometry values, the most recent test is worse. Best in-office FEV1 in last year was 3.42 L (73%) on 12/01/20 after he completed a course of IV antibiotics.         Quality of most recent test graded A.     Follow-up plan:   ??? Reached out to patient via MyChart.  ??? Continue monthly home spirometry monitoring for the management of cystic fibrosis.    Durenda Hurt [place], PA-C  [facility]   [phone]

## 2020-12-19 MED ORDER — FREESTYLE LIBRE 14 DAY SENSOR KIT
0 days
Start: 2020-12-19 — End: ?

## 2020-12-19 MED ORDER — ALBUTEROL SULFATE 2.5 MG/3 ML (0.083 %) SOLUTION FOR NEBULIZATION
Freq: Two times a day (BID) | RESPIRATORY_TRACT | 3 refills | 90 days | Status: CP
Start: 2020-12-19 — End: 2021-12-19

## 2020-12-19 NOTE — Unmapped (Signed)
Received a call from [facility] regarding Roniel's Albuterol concentrate. They are unable to access the 2.5mg /0.52ml dose. Prescription changed to standard albuterol concentration of 2.5mg /36mL and routed electronically to [facility].     [name], RN  CF Nurse Coordinator   [phone]

## 2021-01-02 NOTE — Unmapped (Signed)
The [facility] has made a second and final attempt to reach this patient to refill the following medication:  Trikafta.      We have been unable to leave messages on the following phone numbers: [phone] (H) and have sent a MyChart message.    Dates contacted: 12/29/2020 & 01/02/2021    Last scheduled delivery: 12/06/2020    The patient may be at risk of non-compliance with this medication. The patient should call the [facility] at [phone] (option 4) to refill medication.    Elvyn Krohn Leodis Binet   [facility] Specialty Technician

## 2021-01-11 NOTE — Unmapped (Signed)
Note: Pulmonary medicine does not grant surgical clearance, but can help with assessing risk of post-surgical pulmonary complications and help with pre-surgical optimization.       When elective resectional thoracic surgery is being considered, surgery should be precluded if the predicted postoperative lung function would be too minimal to support an independent lifestyle and the resumption of activities of daily living.    When elective nonthoracic surgery is being considered, preoperative risk assessment can predict which patients have an increased risk of developing post-operative pulmonary complications, thereby identifying those patients who should be treated more aggressively with respect to reducing this risk.    Post-operative complications including but not limited to: atelectasis, infection, bronchospasm, PE, exacerbation of chronic lung disease, respiratory failure and prolonged invasive or non-invasive ventilation, OSA, ARDS    Bapoje et al. ???Preoperative Evaluation of the Patient with Pulmonary Disease??? Chest 2007; 132:1637-1645)        Preoperative risk assessment of post-operative pulmonary complications:   -- Current risk factors include: ASA grade 3 due to cystic fibrosis, Age or obesity. Based on these risk factors, the patient's ARISCAT score for post-operative pulmonary complications (including atelectasis, COPD exacerbation, bronchitis/pneumonia, hypoxia, and respiratory failure requiring mechanical ventilation > 48 hours) is calculated as low-risk which confers a 1.6% combined post-op pulmonary complication risk.   -- The patient's risk of post-operative respiratory failure requiring mechanical ventilation based on Postoperative Respiratory Failure Risk by Nolon Nations al. Is calculated as 1.4% Bartolo Darter, [place] Blase, [place], [place]. Development and validation of a risk calculator predicting postoperative respiratory failure. Chest. 2011 Nov;140(5):1207-15. Epub 2011 Jul 14.)  -- We will defer to our surgical colleagues in regards to weighing risk of post-operative pulmonary complications as outlined above versus benefit of surgery but recommendations for limiting post-op complications are detailed below.  ------ Would continue home medications of dornase once daily, trikafta, insulin, symbicort twice daily or breo once daily, aiway clearance with albuterol and 7% hypertonic saline and aerobika, creon  ------ Recommendations for reducing risk of post-op pulmonary complications: limit duration of surgery and general anesthesia as able, avoid Pancuronium and other long-acting neuromuscular blocking agents, encourage early mobilization, aggressive use of incentive spirometry, give inhaled beta-agonist (albuterol) 30 minutes prior to induction of anesthesia, provide Bipap post-op and QHS for history of COPD, supplemental oxygen to maintain spO2 88-92%, and limit opiate medications to minimum dose required for post-op pain control to avoid respiratory depression.    Patient is also high risk for DIOS - distal intestinal obstruction syndrome, and would recommend good laxative regimen while in hospital and on pain medications.    Patient is also on xarelto, and would recommend restarting it post op as soon as possible for history of recurrent DVT    Last reconciled medication list:    Current Outpatient Medications on File Prior to Visit   Medication Sig Dispense Refill   ??? albuterol (PROVENTIL HFA;VENTOLIN HFA) 90 mcg/actuation inhaler Inhale 2 puffs every six (6) hours as needed. 1 Inhaler 1   ??? albuterol 2.5 mg /3 mL (0.083 %) nebulizer solution Inhale 3 mL (2.5 mg total) by nebulization two (2) times a day. 540 mL 3   ??? amitriptyline (ELAVIL) 100 MG tablet TAKE ONE TABLET BY MOUTH AT BEDTIME (Patient taking differently: Take 100 mg by mouth nightly. ) 30 tablet 1   ??? atorvastatin (LIPITOR) 20 MG tablet Take 20 mg by mouth nightly.     ??? BETHKIS 300 mg/4  mL Nebu Inhale 1 vial via nebulization every 12 hours for 28 days, alternating every other 28 days 224 mL 6   ??? blood sugar diagnostic (GLUCOSE BLOOD) Strp by Other route Three (3) times a day with a meal. Rx sent to [facility] 01/04/20     ??? blood-glucose meter kit Dispense meter that is preferred by patient's insurance company 1 each 0   ??? blood-glucose meter,continuous (DEXCOM G6 RECEIVER) Misc Use as directed 1 each 0   ??? budesonide-formoteroL (SYMBICORT) 160-4.5 mcg/actuation inhaler Inhale 2 puffs Two (2) times a day. 30.6 g 3   ??? cetirizine (ZYRTEC) 10 MG tablet TAKE ONE TABLET BY MOUTH AT LUNCH (Patient taking differently: Take 10 mg by mouth daily. ) 90 tablet 3   ??? CREON 36,000-114,000- 180,000 unit CpDR TAKE 8 CAPSULES BY MOUTH 3 TIMES DAILY WITH MEALS AND TAKE 4 CAPSULES TWICE DAILY WITH SNACKS *MAY TAKE UP TO 1 EXTRA SNACK DAILY. MAX 36 PER DAY* 1200 capsule 11   ??? DEXCOM G6 SENSOR Devi Use sq as directed every 14 days 6 each 11   ??? DEXCOM G6 TRANSMITTER Devi Use as directed 1 each 11   ??? dornase alfa (PULMOZYME) 1 mg/mL nebulizer solution Inhale 2.5 mg daily. 450 mL 3   ??? EASY TOUCH LANCING DEVICE Misc Use as directed.     ??? EASY TOUCH TWIST LANCETS 30 gauge Misc Use as directed.     ??? elexacaftor-tezacaftor-ivacaft (TRIKAFTA) 100-50-75 mg(d) /150 mg (n) tablet Take 2 Tablets (orange) by mouth in the morning and 1 tablet (blue) in the evening with fatty food 84 tablet 6   ??? FLUoxetine 60 mg Tab Take 60 mg by mouth daily.      ??? fluticasone propionate (FLONASE) 50 mcg/actuation nasal spray 1 spray into each nostril daily.     ??? gabapentin 600 mg Tb24 Take 600 mg by mouth daily.     ??? insulin ASPART (NOVOLOG FLEXPEN) 100 unit/mL (3 mL) injection pen Use up to 150 units/day, divided 3 TIMES DAILY BEFORE MEALS 45 mL 0   ??? insulin glargine (BASAGLAR, LANTUS) 100 unit/mL (3 mL) injection pen Inject 0.7 mL (70 Units total) under the skin daily. (Patient taking differently: Inject 50 Units under the skin nightly. ) 30 mL 0   ??? lamoTRIgine (LAMICTAL) 200 MG tablet Take 200 mg by mouth daily.     ??? lisinopriL (PRINIVIL,ZESTRIL) 10 MG tablet Take 1 tablet (10 mg total) by mouth daily. 30 tablet 0   ??? melatonin 10 mg Tab Take 10 mg by mouth nightly.     ??? montelukast (SINGULAIR) 10 mg tablet TAKE ONE TABLET BY MOUTH AT BEDTIME (Patient taking differently: Take 10 mg by mouth nightly. ) 90 tablet 3   ??? MVW COMPLETE FORMUL PROBIOTIC 40 billion cell -15 mg CpDR Take 1 capsule by mouth daily. (Patient taking differently: Take 1 capsule by mouth Two (2) times a day. ) 90 capsule 3   ??? MVW Complete, pediatric multivit 61-D3-vit K, (MVW COMPLETE FORMULATION) 1,500-800 unit-mcg cap Take 1 capsule by mouth Two (2) times a day.     ??? nebulizers Misc Use as directed with inhaled medications 4 each 3   ??? omeprazole (PRILOSEC) 20 MG capsule Take 20 mg by mouth daily.     ??? pen needle, diabetic (BD ULTRA-FINE NANO PEN NEEDLE) 32 gauge x 5/32 (4 mm) Ndle use up to 4 times daily 400 each 12   ??? pramipexole (MIRAPEX) 0.125 MG tablet Take 0.25  mg by mouth every evening.      ??? sodium chloride 7% 7 % Nebu Inhale 4 mL by nebulization Two (2) times a day. Increase to 4 times while taking antibiotics     ??? traZODone (DESYREL) 100 MG tablet Take 1 tablet (100 mg total) by mouth nightly. 30 tablet 0   ??? XARELTO 20 mg tablet Take 20 mg by mouth daily.       No current facility-administered medications on file prior to visit.     Last PFT: 4/22            Lab Results   Component Value Date    WBC 5.7 12/09/2020    HGB 14.9 12/09/2020    HCT 44.5 12/09/2020    PLT 293 12/09/2020       Lab Results   Component Value Date    NA 137 12/09/2020    K 4.1 12/09/2020    CL 100 12/09/2020    CO2 29.0 12/09/2020    BUN 17 12/09/2020    CREATININE 0.85 12/09/2020    GLU 85 12/09/2020    CALCIUM 9.7 12/09/2020    MG 1.9 12/09/2020    PHOS 3.9 08/12/2018       Lab Results   Component Value Date    BILITOT 0.6 12/09/2020    BILIDIR 0.20 07/04/2020    PROT 8.2 12/09/2020    ALBUMIN 4.4 12/09/2020    ALT 30 12/09/2020    AST 21 12/09/2020    ALKPHOS 71 12/09/2020       Lab Results   Component Value Date    LABPROT 11.8 01/18/2020    INR 1.82 12/01/2020       Maia Plan, MD  PGY 6, Pulmonary and Critical Care  Pager: [phone]  January 11, 2021 8:54 AM

## 2021-01-18 MED ORDER — TRAZODONE 150 MG TABLET
Freq: Every evening | 0.00000 days
Start: 2021-01-18 — End: ?

## 2021-01-19 ENCOUNTER — Ambulatory Visit: Payer: Self-pay | Admitting: Orthopedic Surgery

## 2021-02-03 NOTE — Pre-Procedure Instructions (Signed)
Surgical Instructions:    Your procedure is scheduled on Thursday, June 30th (10:13 AM- 12:26 PM).  Report to [facility] Main Entrance "A" at 08:10 A.M., then check in with the Admitting office.  Call this number if you have any questions prior to your surgery date, or have problems the morning of surgery:  [phone]    Remember:  Do not eat after midnight the night before your surgery.  You may drink clear liquids until 07:10 AM the morning of your surgery.   Clear liquids allowed are: Water, Non-Citrus Juices (without pulp), Carbonated Beverages, Clear Tea, Black Coffee Only, and Gatorade.  The day of surgery (if you have diabetes):  Drink ONE Pre-Surgery G2 Gatorade by 07:10 AM the morning of surgery. This bottle was given to you during your hospital pre-op appointment visit.  Nothing else to drink after completing the G2 Gatorade.         If you have questions, please contact your surgeon's office.     Take these medicines the morning of surgery with A SIP OF WATER:   atorvastatin (LIPITOR) cetirizine (ZYRTEC) FLUoxetine HCl  gabapentin (NEURONTIN) lamoTRIgine (LAMICTAL) montelukast (SINGULAIR)  omeprazole (PRILOSEC)  PARoxetine (PAXIL) budesonide-formoterol (SYMBICORT) inhaler dornase alpha (PULMOZYME) nebulizer treatment Sodium Chloride, Inhalant, nebulizer treatment   IF NEEDED: albuterol (PROVENTIL HFA;VENTOLIN HFA) inhaler albuterol (PROVENTIL) nebulizer treatment  Please bring all inhalers with you the day of surgery.    *Follow your surgeon's instructions on when to stop XARELTO.  If no instructions were given by your surgeon then you will need to call the office to get those instructions.     As of today, STOP taking any Aspirin (unless otherwise instructed by your surgeon) Aleve, Naproxen, Ibuprofen, Motrin, Advil, Goody's, BC's, all herbal medications, fish oil, and all vitamins.            WHAT DO I DO ABOUT MY DIABETES MEDICATION?  If your CBG  is greater than 220 mg/dL, you may take  of your sliding scale (correction) dose of insulin aspart (NOVOLOG).   HOW TO MANAGE YOUR DIABETES BEFORE AND AFTER SURGERY  Why is it important to control my blood sugar before and after surgery? Improving blood sugar levels before and after surgery helps healing and can limit problems. A way of improving blood sugar control is eating a healthy diet by:  Eating less sugar and carbohydrates  Increasing activity/exercise  Talking with your doctor about reaching your blood sugar goals High blood sugars (greater than 180 mg/dL) can raise your risk of infections and slow your recovery, so you will need to focus on controlling your diabetes during the weeks before surgery. Make sure that the doctor who takes care of your diabetes knows about your planned surgery including the date and location.  How do I manage my blood sugar before surgery? Check your blood sugar at least 4 times a day, starting 2 days before surgery, to make sure that the level is not too high or low.  Check your blood sugar the morning of your surgery when you wake up and every 2 hours until you get to the Short Stay unit.  If your blood sugar is less than 70 mg/dL, you will need to treat for low blood sugar: Do not take insulin. Treat a low blood sugar (less than 70 mg/dL) with  cup of clear juice (cranberry or apple), 4 glucose tablets, OR glucose gel. Recheck blood sugar in 15 minutes after treatment (to make sure it is greater than  70 mg/dL). If your blood sugar is not greater than 70 mg/dL on recheck, call [phone] for further instructions. Report your blood sugar to the short stay nurse when you get to Short Stay.  If you are admitted to the hospital after surgery: Your blood sugar will be checked by the staff and you will probably be given insulin after surgery (instead of oral diabetes medicines) to make sure you have good blood sugar levels. The goal for blood sugar  control after surgery is 80-180 mg/dL.    Special instructions:    [facility]- Preparing For Surgery  Before surgery, you can play an important role. Because skin is not sterile, your skin needs to be as free of germs as possible. You can reduce the number of germs on your skin by washing with CHG (chlorahexidine gluconate) Soap before surgery.  CHG is an antiseptic cleaner which kills germs and bonds with the skin to continue killing germs even after washing.     Please do not use if you have an allergy to CHG or antibacterial soaps. If your skin becomes reddened/irritated stop using the CHG.  Do not shave (including legs and underarms) for at least 48 hours prior to first CHG shower. It is OK to shave your face.  Please follow these instructions carefully.     Shower the NIGHT BEFORE SURGERY and the MORNING OF SURGERY with CHG Soap.   If you chose to wash your hair, wash your hair first as usual with your normal shampoo. After you shampoo, rinse your hair and body thoroughly to remove the shampoo.  Then [employer] and genitals (private parts) with your normal soap and rinse thoroughly to remove soap.  After that Use CHG Soap as you would any other liquid soap. You can apply CHG directly to the skin and wash gently with a scrungie or a clean washcloth.   Apply the CHG Soap to your body ONLY FROM THE NECK DOWN.  Do not use on open wounds or open sores. Avoid contact with your eyes, ears, mouth and genitals (private parts). Wash Face and genitals (private parts)  with your normal soap.   Wash thoroughly, paying special attention to the area where your surgery will be performed.  Thoroughly rinse your body with warm water from the neck down.  DO NOT shower/wash with your normal soap after using and rinsing off the CHG Soap.  Pat yourself dry with a CLEAN TOWEL.  Wear CLEAN PAJAMAS to bed the night before surgery  Place CLEAN SHEETS on your bed the night before your surgery  DO NOT  SLEEP WITH PETS.   Day of Surgery:  Take a shower with CHG soap. Wear Clean/Comfortable clothing the morning of surgery Do not wear lotions, powders, colognes, or deodorant.   Remember to brush your teeth WITH YOUR REGULAR TOOTHPASTE. Do not wear jewelry. Men may shave face and neck. Do not bring valuables to the hospital. [facility] is not responsible for any belongings or valuables.  Do NOT Smoke (Tobacco/Vaping) or drink Alcohol 24 hours prior to your procedure.  If you use a CPAP at night, you may bring all equipment for your overnight stay.   Contacts, glasses, dentures or bridgework may not be worn into surgery, please bring cases for these belongings.   For patients admitted to the hospital, discharge time will be determined by your treatment team.  Patients discharged the day of surgery will not be allowed to drive home, and someone needs to stay  with them for 24 hours.  ONLY 1 SUPPORT PERSON MAY BE PRESENT WHILE YOU ARE IN SURGERY. IF YOU ARE TO BE ADMITTED ONCE YOU ARE IN YOUR ROOM YOU WILL BE ALLOWED TWO (2) VISITORS.  Minor children may have two parents present. Special consideration for safety and communication needs will be reviewed on a case by case basis.     Please read over the following fact sheets that you were given.

## 2021-02-06 ENCOUNTER — Ambulatory Visit: Admit: 2021-02-06 | Discharge: 2021-02-07 | Payer: PRIVATE HEALTH INSURANCE

## 2021-02-06 ENCOUNTER — Ambulatory Visit
Admit: 2021-02-06 | Discharge: 2021-02-07 | Payer: PRIVATE HEALTH INSURANCE | Attending: Registered" | Primary: Registered"

## 2021-02-06 ENCOUNTER — Encounter (HOSPITAL_COMMUNITY)
Admission: RE | Admit: 2021-02-06 | Discharge: 2021-02-06 | Disposition: A | Discharge status: Home / Self Care | Payer: Managed Care, Other (non HMO) | Source: Ambulatory Visit | Attending: Specialist | Admitting: Specialist

## 2021-02-06 ENCOUNTER — Ambulatory Visit (HOSPITAL_COMMUNITY)
Admission: RE | Admit: 2021-02-06 | Discharge: 2021-02-06 | Disposition: A | Discharge status: Home / Self Care | Payer: Managed Care, Other (non HMO) | Source: Ambulatory Visit | Attending: Orthopedic Surgery | Admitting: Orthopedic Surgery

## 2021-02-06 ENCOUNTER — Other Ambulatory Visit: Payer: Self-pay

## 2021-02-06 ENCOUNTER — Encounter (HOSPITAL_COMMUNITY): Payer: Self-pay

## 2021-02-06 DIAGNOSIS — E669 Obesity, unspecified: Secondary | ICD-10-CM | POA: Insufficient documentation

## 2021-02-06 DIAGNOSIS — I10 Essential (primary) hypertension: Secondary | ICD-10-CM | POA: Diagnosis not present

## 2021-02-06 DIAGNOSIS — Z20822 Contact with and (suspected) exposure to covid-19: Secondary | ICD-10-CM | POA: Insufficient documentation

## 2021-02-06 DIAGNOSIS — Z794 Long term (current) use of insulin: Secondary | ICD-10-CM | POA: Diagnosis not present

## 2021-02-06 DIAGNOSIS — Z86718 Personal history of other venous thrombosis and embolism: Secondary | ICD-10-CM | POA: Insufficient documentation

## 2021-02-06 DIAGNOSIS — Z01812 Encounter for preprocedural laboratory examination: Secondary | ICD-10-CM | POA: Insufficient documentation

## 2021-02-06 DIAGNOSIS — Z7901 Long term (current) use of anticoagulants: Secondary | ICD-10-CM | POA: Insufficient documentation

## 2021-02-06 DIAGNOSIS — M5126 Other intervertebral disc displacement, lumbar region: Secondary | ICD-10-CM | POA: Diagnosis not present

## 2021-02-06 DIAGNOSIS — M4807 Spinal stenosis, lumbosacral region: Secondary | ICD-10-CM | POA: Insufficient documentation

## 2021-02-06 DIAGNOSIS — Z79899 Other long term (current) drug therapy: Secondary | ICD-10-CM | POA: Insufficient documentation

## 2021-02-06 DIAGNOSIS — G4733 Obstructive sleep apnea (adult) (pediatric): Secondary | ICD-10-CM | POA: Insufficient documentation

## 2021-02-06 DIAGNOSIS — K8681 Exocrine pancreatic insufficiency: Secondary | ICD-10-CM | POA: Insufficient documentation

## 2021-02-06 DIAGNOSIS — Z6831 Body mass index (BMI) 31.0-31.9, adult: Secondary | ICD-10-CM | POA: Insufficient documentation

## 2021-02-06 DIAGNOSIS — E119 Type 2 diabetes mellitus without complications: Secondary | ICD-10-CM | POA: Insufficient documentation

## 2021-02-06 DIAGNOSIS — H9190 Unspecified hearing loss, unspecified ear: Principal | ICD-10-CM

## 2021-02-06 DIAGNOSIS — K8689 Other specified diseases of pancreas: Principal | ICD-10-CM

## 2021-02-06 DIAGNOSIS — E089 Diabetes mellitus due to underlying condition without complications: Principal | ICD-10-CM

## 2021-02-06 DIAGNOSIS — H9319 Tinnitus, unspecified ear: Principal | ICD-10-CM

## 2021-02-06 HISTORY — DX: Sleep apnea, unspecified: G47.30

## 2021-02-06 HISTORY — DX: Other specified diseases of pancreas: K86.89

## 2021-02-06 HISTORY — DX: Unspecified asthma, uncomplicated: J45.909

## 2021-02-06 HISTORY — DX: Personal history of other venous thrombosis and embolism: Z86.718

## 2021-02-06 HISTORY — DX: Personal history of urinary calculi: Z87.442

## 2021-02-06 HISTORY — DX: Pneumonia, unspecified organism: J18.9

## 2021-02-06 HISTORY — DX: Type 2 diabetes mellitus without complications: E11.9

## 2021-02-06 LAB — BASIC METABOLIC PANEL
Anion gap: 11 (ref 5–15)
BUN: 13 mg/dL (ref 6–20)
CO2: 28 mmol/L (ref 22–32)
Calcium: 9.3 mg/dL (ref 8.9–10.3)
Chloride: 96 mmol/L — ABNORMAL LOW (ref 98–111)
Creatinine, Ser: 0.78 mg/dL (ref 0.61–1.24)
GFR, Estimated: >60 mL/min (ref >60)
Glucose, Bld: 309 mg/dL — ABNORMAL HIGH (ref 70–99)
Potassium: 4.1 mmol/L (ref 3.5–5.1)
Sodium: 135 mmol/L (ref 135–145)

## 2021-02-06 LAB — CBC
HCT: 44.6 % (ref 39.0–52.0)
Hemoglobin: 14.6 g/dL (ref 13.0–17.0)
MCH: 28.1 pg (ref 26.0–34.0)
MCHC: 32.7 g/dL (ref 30.0–36.0)
MCV: 85.8 fL (ref 80.0–100.0)
Platelets: 299 10*3/uL (ref 150–400)
RBC: 5.2 MIL/uL (ref 4.22–5.81)
RDW: 12.7 % (ref 11.5–15.5)
WBC: 7.6 10*3/uL (ref 4.0–10.5)
nRBC: 0 % (ref 0.0–0.2)

## 2021-02-06 LAB — GLUCOSE, CAPILLARY: Glucose-Capillary: 267 mg/dL — ABNORMAL HIGH (ref 70–99)

## 2021-02-06 LAB — SURGICAL PCR SCREEN
MRSA, PCR: NEGATIVE
Staphylococcus aureus: NEGATIVE

## 2021-02-06 LAB — SARS CORONAVIRUS 2 (TAT 6-24 HRS): SARS Coronavirus 2: NEGATIVE

## 2021-02-06 NOTE — Unmapped (Addendum)
Goals and plans we discussed today:  Nice to see you!  Good luck with your back surgery - make sure to take Miralax 2-3 times daily post-operatively to prevent constipation   Follow up in the CF Clinic in 3 months   I've referred you to [facility] E[facility] Audiology - it would be helpful to complete these appointments before your next CF visit     Thank you for allowing [name] to be a part of your care!     To reach your CF nurse coordinators:    Patients with the last name A-K: Joni Reining [phone]  Patients with the last name L-Z: Harriett Sine [phone]     For urgent issues after hours/weekends:  Hospital Operator: [phone]) [phone], ask for Pulmonary Fellow on-call     To make or change a clinic appointment:   [facility]: [phone]     --> When you should use MyChart:           - Order a prescription refill          - View test results          - Send a non-urgent message or update to the care team          - View after-visit summaries           - See or pay bills      --> When you should call (NOT use MyChart)           - Increase in cough          - Change in amount of mucus or mucus color           - Coughing up blood or blood-tinged mucus          - Chest pain          - Shortness of breath           - Lack of energy, feeling sick, or increase in tiredness     --> I don't have a MyChart. Why should I get one?           - It's encrypted, so your information is secure          - It's a quick, easy way to contact the care team, manage appointments, see test results, and more!      --> How do I sign-up for MyChart?            - Download the MyChart app from the [employer] or [employer] and sign-up in the app           - Sign-up online at [email or web address]

## 2021-02-06 NOTE — Unmapped (Signed)
Pulmonary Clinic - Follow-up Visit    ASSESSMENT     Christian Young is a 35 y.o. male with cystic fibrosis [phone] and [phone] insertion) who presents to clinic for routine CF follow-up. He was last admitted [facility] from 4/[facility] - 12/09/20 for a CF pulmonary exacerbation that developed after a viral URI (non-COVID). His FEV1 improved to 73% during that admission. Today, he has continued to feel well and FEV1 is stable at 72%.     His most pressing issue has been low back pain due to a herniated disc, and he is scheduled for lumbar decompression with [facility] at [facility] on 02/09/21. His primary Pulmonologist Dr. Marcos Young provided a risk assessment for this surgery in a note on 01/11/21. Specifically, in regards to his CF, he should continue his home CF medications, including airway clearance with Albuterol, Hypertonic Saline 7% and Aerobika at minimum BID peri-operatively. In addition, he should be maintained on a good bowel regimen with Miralax post-operatively to prevent DIOS.     Problem List Items Addressed This Visit        Respiratory    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)    Cystic fibrosis (CMS-HCC) - Primary    Relevant Orders    IgE Total    CF Sputum/ CF Sinus Culture    Ambulatory referral to Audiology       Digestive    Pancreatic insufficiency due to cystic fibrosis (CMS-HCC)    Relevant Orders    Vitamin A    Vitamin D 25 Hydroxy (25OH D2 + D3)    Vitamin E    IgE Total      Other Visit Diagnoses     Tinnitus, unspecified laterality        Relevant Orders    Ambulatory referral to Audiology    Hearing loss, unspecified hearing loss type, unspecified laterality        Relevant Orders    Ambulatory referral to Audiology          PLAN     1) Collected routine CF sputum culture (deep pharyngeal swab as patient unable to expectorate sputum)   2) Continue Trikafta - LFT's annually (last checked 11/2020)   3) Continue home CF inhaled meds and airway clearance twice daily  4) Hold off on restarting TOBI nebs at this time - has not used in quite some time (no prescription since 2020), reports tinnitus and hearing loss, referred to [facility] ENT last visit due to history of sinus disease (never scheduled), will also refer to Audiology today, it would be beneficial to complete these appointments before his next CF visit in 3 months   5) Continue Creon and MVW vitamins   6) Collected Vitamin A, D, E levels and total IgE level (up to date on other CF labs)   7) Patient follows with [facility] for his CF-related diabetes - last A1c 10.7%, has an Endo appointment on 03/09/21, using Freestyle Libre   8) Patient reports receiving his 2nd COVID booster   9) Patient following locally with Psychiatry   10) As above, patient is scheduled for lumbar decompression surgery with [facility] on 02/09/21 at [facility]     The patient will return to clinic in 3 months to see Dr. Audrea Young, or sooner as needed.     Christian Hurt [place], PA-C  [facility]   [phone]    HISTORY:     Interval History:  Diagnosed at 71 with chronic  infections, had consolidation and parapneumonic effusion,sent to [facility] treatment since then.   [id number] and [id number] insertion. 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    Christian Young presents to clinic today for routine CF follow-up. He was last admitted to [facility] from 4/14-4/29/22 for a CF exacerbation that developed after having a viral URI. He felt much improved at the end of his hospitalization and reports that his breathing has remained stable since then. He currently denies increased cough or sputum, chest pain, wheezing or shortness of breath. He reports using his Albuterol and Hypertonic Saline twice daily and Pulmozyme daily at home. He has not been on Tobi for many months as he was told to hold off due to tinnitus and hearing loss. His sinuses are doing well, and he continues to use Flonase.     His most pressing issue is that he has been following with local Orthopedics due to a herniated disc between L5 and S1 and 2 other bulging discs. He has a planned surgery with [facility] on 6/30/222 for decompression. He will be staying in the hospital overnight for observation at [facility]. He attempted physical therapy and cortisone injections which were not effective. He has been on Hydrocodone-Acetaminophen 5-325 mg twice daily and Gabapentin 600 mg TID for pain control.     Due to this pain medication, he has been taking Miralax 1 capful daily to prevent constipation and has been doing well. He has 2-3 BM's per day. He also has an OTC laxative pill to take as needed but only took it once. He has been doing well from a GI standpoint without abdominal pain, nausea, vomiting, diarrhea, or heartburn. His weight is stable at 228 lbs and he would still like to get down to 200 lbs. He takes his enzymes regularly. He takes 2 MVW D5000 vitamins twice a day and MVW probiotic daily.     He and his wife are living in a house on her sister's property in [place]. They are watching her sister's dog named Link Snuffer, which Christian Young has enjoyed. Christian Young has been on FMLA since January due to his back pain and he was let go from [employer] due to this. 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 If he gets it, the training process lasts 6-9 months. His wife works as an Museum/gallery exhibitions officer and is in school to be a Radiation protection practitioner.    Cystic fibrosis issues:    Sinuses: Uses Flonase and takes Claritin and Singulair. No congestion, drainage or pressure currently.     Airway clearance: Does albuterol, and 7% hypertonic saline twice a day.  Reports compliance to it.  Does Pulmozyme daily.  Uses the vest occasionally.  Hasn't used Tobi in months.     GI and nutrition: Denies any constipation or diarrhea. On Miralax daily due to Hydrocodone for back pain.  Reports compliance to his Creon.  Has lost 30-40 lbs in the past 1-2 years.     Hypertension: On lisinopril 10 mg daily. Was on Coreg before, which was started in 2018 as an inpatient for sinus tachycardia.     Diabetes: Followed and managed by endocrinology.  Reports compliance to his insulin. Since weight loss, has decreased insulin to Lantus 50 units daily and Novolog 30 units with meals. Now has a Therapist, art and it works much better for him than [employer].     Mental health issues: Following with local Psychiatrist. Trazodone increased from 100 mg to 150 mg nightly.  Past Medical History:   Diagnosis Date   ??? Anxiety    ??? Chronic pain disorder    ??? Cystic fibrosis (CMS-HCC)    ??? Depression    ??? Hypertension    ??? Lumbar radiculopathy 10/26/2020   ??? Nonproductive cough 04/05/2018     Past Surgical History:   Procedure Laterality Date   ??? IR INSERT PORT AGE GREATER THAN 5 YRS  03/27/2019    IR INSERT PORT AGE GREATER THAN 5 YRS 03/27/2019 Rush Barer, MD IMG [facility]   ??? PR REMOVAL OF LUNG,LOBECTOMY Right 03/29/2017    Procedure: REMOVAL OF LUNG, OTHER THAN PNEUMONECTOMY; SINGLE LOBE (LOBECTOMY);  Surgeon: Cherie Dark, MD;  Location: MAIN OR [facility];  Service: Thoracic       Other History:  The social history and family history were personally reviewed and updated in the patient's electronic medical record.     Home Medications:  Current Outpatient Medications on File Prior to Visit   Medication Sig Dispense Refill   ??? baclofen (LIORESAL) 10 MG tablet baclofen 10 mg tablet   Take 1 tablet (10 mg) by mouth 3 times per day as needed     ??? gabapentin (NEURONTIN) 600 MG tablet Take 600 mg by mouth Three (3) times a day.     ??? traZODone (DESYREL) 150 MG tablet 150 mg nightly.     ??? albuterol (PROVENTIL HFA;VENTOLIN HFA) 90 mcg/actuation inhaler Inhale 2 puffs every six (6) hours as needed. 1 Inhaler 1   ??? albuterol 2.5 mg /3 mL (0.083 %) nebulizer solution Inhale 3 mL (2.5 mg total) by nebulization two (2) times a day. 540 mL 3   ??? amitriptyline (ELAVIL) 100 MG tablet TAKE ONE TABLET BY MOUTH AT BEDTIME (Patient taking differently: Take 100 mg by mouth nightly. ) 30 tablet 1   ??? atorvastatin (LIPITOR) 20 MG tablet Take 20 mg by mouth nightly.     ??? BETHKIS 300 mg/4 mL Nebu Inhale 1 vial via nebulization every 12 hours for 28 days, alternating every other 28 days 224 mL 6   ??? blood sugar diagnostic (GLUCOSE BLOOD) Strp by Other route Three (3) times a day with a meal. Rx sent to [facility] 01/04/20     ??? blood-glucose meter kit Dispense meter that is preferred by patient's insurance company 1 each 0   ??? budesonide-formoteroL (SYMBICORT) 160-4.5 mcg/actuation inhaler Inhale 2 puffs Two (2) times a day. 30.6 g 3   ??? cetirizine (ZYRTEC) 10 MG tablet TAKE ONE TABLET BY MOUTH AT LUNCH (Patient taking differently: Take 10 mg by mouth daily. ) 90 tablet 3   ??? CREON 36,000-114,000- 180,000 unit CpDR TAKE 8 CAPSULES BY MOUTH 3 TIMES DAILY WITH MEALS AND TAKE 4 CAPSULES TWICE DAILY WITH SNACKS *MAY TAKE UP TO 1 EXTRA SNACK DAILY. MAX 36 PER DAY* 1200 capsule 11   ??? dornase alfa (PULMOZYME) 1 mg/mL nebulizer solution Inhale 2.5 mg daily. 450 mL 3   ??? EASY TOUCH LANCING DEVICE Misc Use as directed.     ??? EASY TOUCH TWIST LANCETS 30 gauge Misc Use as directed.     ??? elexacaftor-tezacaftor-ivacaft (TRIKAFTA) 100-50-75 mg(d) /150 mg (n) tablet Take 2 Tablets (orange) by mouth in the morning and 1 tablet (blue) in the evening with fatty food 84 tablet 6   ??? FLUoxetine 60 mg Tab Take 60 mg by mouth daily.      ??? fluticasone propionate (FLONASE) 50 mcg/actuation nasal spray 1 spray into each  nostril daily.     ??? FREESTYLE LIBRE 14 DAY SENSOR kit Use 3-4 times daily     ??? HYDROcodone-acetaminophen (NORCO) 5-325 mg per tablet hydrocodone 5 mg-acetaminophen 325 mg tablet   Take 1 tablet(s) twice a day by oral route as needed for pain for 7 days.     ??? insulin ASPART (NOVOLOG FLEXPEN) 100 unit/mL (3 mL) injection pen Use up to 150 units/day, divided 3 TIMES DAILY BEFORE MEALS 45 mL 0   ??? insulin glargine (BASAGLAR, LANTUS) 100 unit/mL (3 mL) injection pen Inject 0.7 mL (70 Units total) under the skin daily. (Patient taking differently: Inject 50 Units under the skin nightly. ) 30 mL 0   ??? lamoTRIgine (LAMICTAL) 200 MG tablet Take 200 mg by mouth daily.     ??? lisinopriL (PRINIVIL,ZESTRIL) 10 MG tablet Take 1 tablet (10 mg total) by mouth daily. 30 tablet 0   ??? melatonin 10 mg Tab Take 10 mg by mouth nightly.     ??? methocarbamoL (ROBAXIN) 750 MG tablet methocarbamol 750 mg tablet   Take 1-2 tablets (1.5 grams) by mouth 3 times per day AS NEEDED back pain. May cause drowsiness, do not drive]     ??? montelukast (SINGULAIR) 10 mg tablet TAKE ONE TABLET BY MOUTH AT BEDTIME (Patient taking differently: Take 10 mg by mouth nightly. ) 90 tablet 3   ??? MVW COMPLETE FORMUL PROBIOTIC 40 billion cell -15 mg CpDR Take 1 capsule by mouth daily. (Patient taking differently: Take 1 capsule by mouth Two (2) times a day. ) 90 capsule 3   ??? MVW Complete, pediatric multivit 61-D3-vit K, (MVW COMPLETE FORMULATION) 1,500-800 unit-mcg cap Take 1 capsule by mouth Two (2) times a day.     ??? nebulizers Misc Use as directed with inhaled medications 4 each 3   ??? omeprazole (PRILOSEC) 20 MG capsule Take 20 mg by mouth daily.     ??? pen needle, diabetic (BD ULTRA-FINE NANO PEN NEEDLE) 32 gauge x 5/32 (4 mm) Ndle use up to 4 times daily 400 each 12   ??? pramipexole (MIRAPEX) 0.125 MG tablet Take 0.25 mg by mouth every evening.      ??? sodium chloride 7% 7 % Nebu Inhale 4 mL by nebulization Two (2) times a day. Increase to 4 times while taking antibiotics     ??? XARELTO 20 mg tablet Take 20 mg by mouth daily.       No current facility-administered medications on file prior to visit.        Allergies:  Allergies as of 02/06/2021 - Reviewed 11/27/2020   Allergen Reaction Noted   ??? Aztreonam Anaphylaxis, Hives, Nausea And Vomiting, and Rash 09/06/2015   ??? [place] [aztreonam lysine] Anaphylaxis 12/27/2016   ??? Cefepime Itching, Nausea Only, and Other (See Comments) 09/06/2015   ??? Other Anaphylaxis and Other (See Comments) 09/06/2015   ??? Slo-bid 100 Anaphylaxis 06/20/2017   ??? Tobramycin Tinnitus 09/06/2015   ??? Banana Itching and Nausea And Vomiting 07/29/2016     Genetics:  [id number] and [id number] insertion  ??  Airway Clearance Regimen:  HS BID  Pulmozyme  ??  Inhaled ABX:  Off TOBI due to tinnitus/hearing loss   ??  Hemoptysis:   No  ABPA:             No  Ptx:                  No  Sinusitus:       Yes  ??  Panc Insuf:     Yes  PEG:                No  DIOS:              No bowel issues  CF Liver Dz:   No  ??  Diabetes:        Uncontrolled - follows w/ endocrine  Osteopenia:   N/A  ??  Depression:   Yes  ??  Other Co-morbidities:  Herniated disc with lumbar radiculopathy   ??  Social Setting:  Lives with wife in [place]    Micro History:  ??  MABSC  - Assumed S to erythro [facility]  06/2017: MASBC not present, but M gordonae is   ??  MSSA (09/29/19)     Smooth PsA  S: Cipro, Levo, Tobra  I: Aztreo, Cefe, Ceftaz, PT, LVX  ??  Stenotrophamonas  R: Ceftat  ??  MABSC (macrolide sensitive)  Last Negative Cx: 03/2017  Last Positive: 02/21/17  ????  Review of Systems:  A comprehensive review of systems was completed and negative except as noted in HPI.    PHYSICAL EXAM:     Vitals:    02/06/21 1052   BP: 138/91   Pulse: 93   Temp: 36.7 ??C (98 ??F)   TempSrc: Temporal   SpO2: 96%   Weight: (!) 103.4 kg (228 lb)   Height: 183 cm (6' 0.05)     Body mass index is 30.88 kg/m??.  Wt Readings from Last 3 Encounters:   02/06/21 (!) 103.4 kg (228 lb)   11/24/20 (!) 101.3 kg (223 lb 4.8 oz)   06/28/20 (!) 105.8 kg (233 lb 4.8 oz)     GEN: Cooperative male, sitting up on exam table, NAD  HEENT: NCAT, EOMI, sclerae anicteric  NECK: Supple, trachea midline  LYMPH: No palpable lymphadenopathy   CHEST: Port-a-cath present in left upper chest wall  HEART/CV: RRR, S1, S2 nl, no MRG  LUNGS/RESP: CTA bilaterally, no crackles or wheezes, normal WOB on RA  ABD/GI: NABS, soft, NT/ND, no rebound or guarding, no masses, no hepatomegaly noted   EXT: No cyanosis, clubbing, or edema  SKIN: No rashes or lesions NEURO: No focal deficits noted  PSYCH: Awake, alert, and interactive. Mood and affect appropriate.     LABORATORY and RADIOLOGY DATA:     Pulmonary Function Tests:  02/06/21        Interpretation: Spirometry demonstrates mild obstruction and is unchanged from prior test.     Pertinent Laboratory Data:  CF Sputum Culture   Date Value Ref Range Status   07/01/2020 <1+ Smooth Pseudomonas aeruginosa (A)  Final   07/01/2020 <1+ Mucoid Pseudomonas aeruginosa (A)  Final   07/01/2020 1+ Oropharyngeal Flora Isolated  Final        AFB Smear   Date Value Ref Range Status   07/01/2020  No Acid Fast Bacilli Seen Final    NO ACID FAST BACILLI SEEN- 3 negative smears do not exclude pulmonary TB. If active pulmonary TB is suspected, continue airborne isolation until pulmonary disease is excluded by negative cultures.     AFB Culture   Date Value Ref Range Status   07/01/2020 No Acid Fast Bacilli Detected  Final        IgE, Total   Date Value Ref Range Status   01/18/2020 20 6 - 495 IU/mL Final        Total Bilirubin   Date Value Ref Range Status  12/09/2020 0.6 0.3 - 1.2 mg/dL Final     AST   Date Value Ref Range Status   12/09/2020 21 <=34 U/L Final     ALT   Date Value Ref Range Status   12/09/2020 30 10 - 49 U/L Final     Alkaline Phosphatase   Date Value Ref Range Status   12/09/2020 71 46 - 116 U/L Final        INR   Date Value Ref Range Status   12/01/2020 1.82  Final        Hemoglobin A1C   Date Value Ref Range Status   11/29/2020 10.7 (H) 4.8 - 5.6 % Final     Estimated Average Glucose   Date Value Ref Range Status   11/29/2020 260 mg/dL Final        Vitamin A   Date Value Ref Range Status   01/18/2020 42.4 18.9 - 57.3 ug/dL Final     Comment:     Reference intervals for vitamin A determined from [facility] internal  studies. Individuals with vitamin A less than 20 ug/dL are considered  vitamin A deficient and those with serum concentrations less than  10 ug/dL are considered severely deficient.  This test was developed and its performance characteristics  determined by [facility]. It has not been cleared or approved  by the Food and Drug Administration.       Vitamin D Total (25OH)   Date Value Ref Range Status   03/19/2017 27.9 20.0 - 80.0 ng/mL Final

## 2021-02-06 NOTE — Unmapped (Signed)
CF sputum throat swab collected as ordered. Sample labeled in front of patient after confirming name and DOB. Sample tubed to the lab.    [name], RN  CF Nurse Coordinator   [phone]

## 2021-02-06 NOTE — Unmapped (Signed)
Encounter opened in error. Pt not evaluated by RD in clinic on this date. Due for vitamin levels.    Jackqulyn Livings MPH, RD, LDN  Pager: [phone]

## 2021-02-06 NOTE — Progress Notes (Addendum)
Anesthesia Chart Review:  Case: [id number] Date/Time: 02/09/21 0958   Procedure: Microdiscectomy, decompression L5-S1 Right (Right)   Anesthesia type: General   Pre-op diagnosis: Stenosis herniated L5-S1 right   Location: [facility] OR ROOM 18 / [facility] OR   Surgeons: Jene Every, MD       DISCUSSION: Patient is a 35 year old male scheduled for the above procedure.  History includes never smoker, cystic fibrosis (diagnosed age 47; history of complicated PNA due to S. 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 Given concern his RUL was likely contributing to recurrent infections without contributing to ventilation/perfusion, he underwent RUL lobectomy 8/17."  BMI is consistent with obesity.   - Pulmonology evaluation with PFTs on 02/06/21 at [facility] in [place]. Complete note is not yet viewable in Care Everywhere, but copy of PFTs received. Per Orlean Bradford, PA, "Good luck with your back surgery - make sure to take Miralax 2-3 times daily post-operatively to prevent constipation  Follow up in the [facility] in 3 months - we will try to coordinate an appointment with ENT and Audiology (hearing test) for the same day if possible"  In addition on 01/11/21 Dr. [name], outlined assessment risk of post-surgical pulmonary complication with pre-surgical optimization.  See [facility] Everywhere for full details, but includes the following: "Preoperative risk assessment of post-operative pulmonary complications:  -- Current risk factors include: ASA grade 3 due to cystic fibrosis, Age or obesity. Based on these risk factors, the  patient's ARISCAT score for post-operative pulmonary complications (including atelectasis, COPD exacerbation, bronchitis/pneumonia, hypoxia, and respiratory failure requiring mechanical ventilation > 48 hours) is calculated as low-risk which confers a 1.6% combined post-op pulmonary complication risk.  -- The patient's risk of post-operative respiratory failure requiring mechanical ventilation based on Postoperative Respiratory Failure Risk by Nolon Nations al. Is calculated as 1.4% Bartolo Darter, [place] Blase, [place], [place]. Development and validation of a risk calculator predicting postoperative respiratory failure. Chest. 2011 Nov;140(5):1207-15. Epub 2011 Jul 14.) -- We will defer to our surgical colleagues in regards to weighing risk of post-operative pulmonary complications as outlined above versus benefit of surgery but recommendations for limiting post-op complications are detailed below. ------ Would continue home medications of dornase once daily, trikafta, insulin, symbicort twice daily or breo once daily, aiway clearance with albuterol and 7% hypertonic saline and aerobika, creon ------ Recommendations for reducing risk of post-op pulmonary complications: limit duration of surgery and general anesthesia as able, avoid Pancuronium and other long-acting neuromuscular blocking agents, encourage early mobilization, aggressive use of incentive spirometry, give inhaled beta-agonist (albuterol) 30 minutes prior to induction of anesthesia, provide Bipap post-op and QHS for history of COPD, supplemental oxygen to maintain spO2 88-92%, and limit opiate medications to minimum dose required for post-op pain control to avoid respiratory depression.  Patient is also high risk for DIOS - distal intestinal obstruction syndrome, and would recommend good laxative regimen while in hospital and on pain medications.  Patient is also on xarelto, and would recommend restarting it post op as soon as  possible for history of recurrent DVT".    Patient said that Dr. Dawna Part manages his Xarelto and was  given instructions to hold 3 days prior to surgery. 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 Back pain now limits his activity.    He has medical and pulmonology preoperative input. A1c done at PCP office on 01/12/21 was elevated at 10.2%. He reports fasting CBGs in the 150's. He was notified that surgeon may want DM better controlled for surgery depending on urgency, and from anesthesia standpoint a significantly elevated glucose on the day of surgery could lead to case delay or cancellation. I have notified Cordelia Pen at Dr. Veronda Prude office of recent A1c results. She will have Dr. Shelle Iron review.    02/06/21 presurgical COVID-19 test was negative. Anesthesia team to evaluate on the day of surgery.    VS: BP 128/90   Pulse 94   Temp 36.7 C   Resp 17   Ht 6' (1.829 m)   Wt 104.9 kg   SpO2 97%   BMI 31.37 kg/m    PROVIDERS: Jerrye Bushy, FNP ([facility] in [place]) She signed note of medical clearance on 01/12/21 with recommendation for pulmonary input as well.  Maia Plan, MD ([id number]) is pulmonologist [facility]) Venetia Night, MD is endocrinologist [facility]). Last telemedicine visit seen 04/23/19. Next Visit scheduled for 03/09/21.   LABS:  Preoperative labs noted. A1c 10.2% on 01/12/21 at PCP office.  (all labs ordered are listed, but only abnormal results are displayed)  Labs Reviewed  GLUCOSE, CAPILLARY - Abnormal; Notable for the following components:      Result Value   Glucose-Capillary 267 (*)    All other components within normal limits  BASIC METABOLIC PANEL - Abnormal; Notable for the following components:   Chloride 96 (*)    Glucose, Bld 309 (*)    All other components within normal limits  SARS CORONAVIRUS 2 (TAT 6-24 HRS)  SURGICAL PCR SCREEN  CBC    OTHER: Spirometry 02/06/21  [facility]): FVC 4.69 (81.2%) FEV1 3.38 (71.9%),  FEV1/FVC 72% (88.2%) FEF 25-75% 2.21 (47.4%)  Spirometry 12/01/20 [facility]):  FVC 4.69 (61%) FEV1 3.42 (72.5%),  FEV1/FVC 73% (89.2%) FEF 25-75% 2.32   IMAGES: Xray L-spine 02/06/21:  FINDINGS: Paraspinal soft tissues are unremarkable. No acute or focal bony abnormality identified. Anatomic alignment. IMPRESSION: No acute or focal bony abnormality notified.  CXR 11/26/20 [facility]): FINDINGS:  - Similar appearance of accessed left tunneled port catheter with tip projecting over the right atrium.  - Scarring at the apex of the right lung. The previously described small right apical pneumothorax is not confirmed on this study. Right apical parenchymal scarring in this patient with history of upper lobectomy on the right and thoracotomy changes. Right hilar surgical skin clips.  - No pleural effusion or pneumothorax.  - Unremarkable cardiomediastinal silhouette.  - Stable mild elevation of the right hemidiaphragm. IMPRESSION:  - No right apical pneumothorax is identified on follow-up imaging.   EKG: 11/24/20 [facility]): NSR at 97 bpm (copy received).   CV: N/A  Past Medical History:  Diagnosis Date   Asthma    as a child, no longer treated for Asthma   Cystic fibrosis (HCC)    Diabetes mellitus, type II (HCC)    History of kidney stones    History of recurrent deep vein thrombosis (DVT)    related to PICC line   Hypertension    Pancreatic insufficiency due to cystic fibrosis (HCC)    Pneumonia  Sleep apnea    History of OSA, but no longer uses CPAP since weight loss    Past Surgical History:  Procedure Laterality Date   LOBECTOMY Right 03/2017   partial   NASAL SINUS SURGERY     approx 2014    MEDICATIONS:  XARELTO 20 MG TABS tablet   albuterol (PROVENTIL HFA;VENTOLIN HFA) 108 (90 Base) MCG/ACT inhaler   albuterol (PROVENTIL) (2.5 MG/3ML) 0.083% nebulizer solution   atorvastatin (LIPITOR) 20 MG tablet    budesonide-formoterol (SYMBICORT) 160-4.5 MCG/ACT inhaler   cetirizine (ZYRTEC) 10 MG tablet   chlorpheniramine-HYDROcodone (TUSSIONEX PENNKINETIC ER) 10-8 MG/5ML SUER   dornase alpha (PULMOZYME) 1 MG/ML nebulizer solution   FLUoxetine HCl 60 MG TABS   gabapentin (NEURONTIN) 600 MG tablet   insulin aspart (NOVOLOG) 100 UNIT/ML injection   lamoTRIgine (LAMICTAL) 200 MG tablet   lipase/protease/amylase (CREON) 36000 UNITS CPEP capsule   lisinopril (ZESTRIL) 10 MG tablet   montelukast (SINGULAIR) 10 MG tablet   Multiple Vitamin (MULTIVITAMIN WITH MINERALS) TABS tablet   omeprazole (PRILOSEC) 20 MG capsule   PARoxetine (PAXIL) 10 MG tablet   pramipexole (MIRAPEX) 0.125 MG tablet   Probiotic Product (PROBIOTIC PO)   Sodium Chloride, Inhalant, 7 % NEBU   traZODone (DESYREL) 150 MG tablet   No current facility-administered medications for this encounter.    Shonna Chock, PA-C Surgical Short Stay/Anesthesiology [facility] Phone [phone] [facility] Phone [phone] 02/07/2021 1:58 PM

## 2021-02-06 NOTE — Progress Notes (Signed)
PCP: Rusty Aus FNP @ [facility] in [place] Cardiologist: denies Pulmonologist: Dr. Alveta Heimlich office visit today  EKG: 11/24/20-- requested copy in paper chart CXR: 11/26/20 (Care Everywhere) Pre-op Lumbar Xray today ECHO: denies Stress Test: denies Cardiac Cath: denies PFTs: 02/06/21 at pulm office appt today--report not posted in Care Everywhere yet, release of information signed and placed in chart if needed.    Fasting Blood Sugar- 150's Checks Blood Sugar 3 times a day before meals. Called PCP office, last A1C was done on 01/13/21= 10.2.  Office faxing results. CBG today 267--Pt reports eating lunch less than an hour ago, Chick fil a 12 count nugget meal, large fry, and Poweraid.  Discussed importance of having good blood sugar control prior to surgery.  On Xarelto: Dr. [name] manages, told to hold 3 days. Last dose 02/05/21  ERAS: G2 drink provided.   Covid tested today, aware to wear a mask and limit exposure  Patient denies shortness of breath, fever, cough, and chest pain at PAT appointment.  Patient verbalized understanding of instructions provided today at the PAT appointment.  Patient asked to review instructions at home and day of surgery.

## 2021-02-07 ENCOUNTER — Encounter (HOSPITAL_COMMUNITY): Payer: Self-pay

## 2021-02-07 LAB — IGE: TOTAL IGE: 18.8 [IU]/mL

## 2021-02-07 NOTE — Anesthesia Preprocedure Evaluation (Addendum)
Anesthesia Evaluation  Patient identified by MRN, date of birth, ID band Patient awake    Reviewed: Allergy & Precautions, NPO status , Patient's Chart, lab work & pertinent test results  Airway Mallampati: II  TM Distance: >3 FB Neck ROM: Full    Dental  (+) Dental Advisory Given   Pulmonary asthma , sleep apnea ,  Cystic fibrosis   breath sounds clear to auscultation       Cardiovascular hypertension, Pt. on medications  Rhythm:Regular Rate:Normal     Neuro/Psych negative neurological ROS     GI/Hepatic negative GI ROS, Neg liver ROS,   Endo/Other  diabetes, Type 2, Insulin Dependent  Renal/GU negative Renal ROS     Musculoskeletal   Abdominal   Peds  Hematology negative hematology ROS (+)   Anesthesia Other Findings   Reproductive/Obstetrics                            Anesthesia Physical Anesthesia Plan  ASA: 2  Anesthesia Plan: General   Post-op Pain Management:    Induction: Intravenous  PONV Risk Score and Plan: 2 and Dexamethasone, Ondansetron and Treatment may vary due to age or medical condition  Airway Management Planned: Oral ETT  Additional Equipment:   Intra-op Plan:   Post-operative Plan: Extubation in OR  Informed Consent: I have reviewed the patients History and Physical, chart, labs and discussed the procedure including the risks, benefits and alternatives for the proposed anesthesia with the patient or authorized representative who has indicated his/her understanding and acceptance.     Dental advisory given  Plan Discussed with:   Anesthesia Plan Comments: ( )       Anesthesia Quick Evaluation

## 2021-02-08 LAB — VITAMIN D 25 HYDROXY: VITAMIN D, TOTAL (25OH): 38.2 ng/mL (ref 20.0–80.0)

## 2021-02-09 ENCOUNTER — Other Ambulatory Visit: Payer: Self-pay

## 2021-02-09 ENCOUNTER — Ambulatory Visit (HOSPITAL_COMMUNITY)
Admission: RE | Admit: 2021-02-09 | Discharge: 2021-02-10 | Disposition: A | Discharge status: Home / Self Care | Payer: Managed Care, Other (non HMO) | Attending: Specialist | Admitting: Specialist

## 2021-02-09 ENCOUNTER — Encounter (HOSPITAL_COMMUNITY)
Admission: RE | Disposition: A | Discharge status: Home / Self Care | Payer: Self-pay | Source: Home / Self Care | Attending: Specialist

## 2021-02-09 ENCOUNTER — Encounter (HOSPITAL_COMMUNITY): Payer: Self-pay | Admitting: Specialist

## 2021-02-09 ENCOUNTER — Ambulatory Visit: Payer: Self-pay | Admitting: Orthopedic Surgery

## 2021-02-09 ENCOUNTER — Ambulatory Visit (HOSPITAL_COMMUNITY): Payer: Managed Care, Other (non HMO)

## 2021-02-09 ENCOUNTER — Ambulatory Visit (HOSPITAL_COMMUNITY): Payer: Managed Care, Other (non HMO) | Admitting: Anesthesiology

## 2021-02-09 ENCOUNTER — Ambulatory Visit (HOSPITAL_COMMUNITY): Payer: Managed Care, Other (non HMO) | Admitting: Vascular Surgery

## 2021-02-09 DIAGNOSIS — I10 Essential (primary) hypertension: Secondary | ICD-10-CM | POA: Diagnosis not present

## 2021-02-09 DIAGNOSIS — M48061 Spinal stenosis, lumbar region without neurogenic claudication: Secondary | ICD-10-CM | POA: Insufficient documentation

## 2021-02-09 DIAGNOSIS — Z86718 Personal history of other venous thrombosis and embolism: Secondary | ICD-10-CM | POA: Diagnosis not present

## 2021-02-09 DIAGNOSIS — Z79899 Other long term (current) drug therapy: Secondary | ICD-10-CM | POA: Insufficient documentation

## 2021-02-09 DIAGNOSIS — Z888 Allergy status to other drugs, medicaments and biological substances status: Secondary | ICD-10-CM | POA: Diagnosis not present

## 2021-02-09 DIAGNOSIS — Z7901 Long term (current) use of anticoagulants: Secondary | ICD-10-CM | POA: Diagnosis not present

## 2021-02-09 DIAGNOSIS — J45909 Unspecified asthma, uncomplicated: Secondary | ICD-10-CM | POA: Insufficient documentation

## 2021-02-09 DIAGNOSIS — E119 Type 2 diabetes mellitus without complications: Secondary | ICD-10-CM | POA: Diagnosis not present

## 2021-02-09 DIAGNOSIS — Z794 Long term (current) use of insulin: Secondary | ICD-10-CM | POA: Insufficient documentation

## 2021-02-09 DIAGNOSIS — Z881 Allergy status to other antibiotic agents status: Secondary | ICD-10-CM | POA: Insufficient documentation

## 2021-02-09 DIAGNOSIS — M5117 Intervertebral disc disorders with radiculopathy, lumbosacral region: Secondary | ICD-10-CM | POA: Insufficient documentation

## 2021-02-09 DIAGNOSIS — M5126 Other intervertebral disc displacement, lumbar region: Secondary | ICD-10-CM | POA: Diagnosis present

## 2021-02-09 DIAGNOSIS — Z419 Encounter for procedure for purposes other than remedying health state, unspecified: Secondary | ICD-10-CM

## 2021-02-09 DIAGNOSIS — I878 Other specified disorders of veins: Secondary | ICD-10-CM | POA: Diagnosis not present

## 2021-02-09 DIAGNOSIS — Z91018 Allergy to other foods: Secondary | ICD-10-CM | POA: Insufficient documentation

## 2021-02-09 HISTORY — PX: LUMBAR LAMINECTOMY/DECOMPRESSION MICRODISCECTOMY: SHX5026

## 2021-02-09 LAB — GLUCOSE, CAPILLARY
Glucose-Capillary: 261 mg/dL — ABNORMAL HIGH (ref 70–99)
Glucose-Capillary: 215 mg/dL — ABNORMAL HIGH (ref 70–99)
Glucose-Capillary: 227 mg/dL — ABNORMAL HIGH (ref 70–99)
Glucose-Capillary: 283 mg/dL — ABNORMAL HIGH (ref 70–99)

## 2021-02-09 SURGERY — LUMBAR LAMINECTOMY/DECOMPRESSION MICRODISCECTOMY 1 LEVEL
Anesthesia: General | Laterality: Right

## 2021-02-09 MED ORDER — ELEXACAF-TEZACAF-IVACAF&IVACAF 100-50-75 & 150 MG PO TBPK: 1.0000 | ORAL_TABLET | Freq: Two times a day (BID) | ORAL | Status: DC

## 2021-02-09 MED ORDER — ONDANSETRON HCL 4 MG PO TABS: 4.0000 mg | ORAL_TABLET | Freq: Four times a day (QID) | ORAL | Status: DC | PRN

## 2021-02-09 MED ORDER — ACETAMINOPHEN 10 MG/ML IV SOLN
1000.0000 mg | INTRAVENOUS | Status: AC
  Administered 2021-02-09: 1000 mg via INTRAVENOUS
  Filled 2021-02-09: qty 100

## 2021-02-09 MED ORDER — INSULIN ASPART 100 UNIT/ML IJ SOLN
8.0000 [IU] | Freq: Once | INTRAMUSCULAR | Status: AC
  Administered 2021-02-09: 8 [IU] via SUBCUTANEOUS
  Filled 2021-02-09: qty 0.08

## 2021-02-09 MED ORDER — ONDANSETRON HCL 4 MG/2ML IJ SOLN
INTRAMUSCULAR | Status: DC | PRN
  Administered 2021-02-09: 4 mg via INTRAVENOUS

## 2021-02-09 MED ORDER — RISAQUAD PO CAPS
1.0000 | ORAL_CAPSULE | Freq: Every day | ORAL | Status: DC
  Administered 2021-02-09 – 2021-02-10 (×2): 1 via ORAL
  Filled 2021-02-09 (×2): qty 1

## 2021-02-09 MED ORDER — PRAMIPEXOLE DIHYDROCHLORIDE 0.25 MG PO TABS
0.2500 mg | ORAL_TABLET | Freq: Every day | ORAL | Status: DC
  Administered 2021-02-09: 0.25 mg via ORAL
  Filled 2021-02-09 (×2): qty 1

## 2021-02-09 MED ORDER — ADULT MULTIVITAMIN W/MINERALS CH
1.0000 | ORAL_TABLET | Freq: Every day | ORAL | Status: DC
  Administered 2021-02-09 – 2021-02-10 (×2): 1 via ORAL
  Filled 2021-02-09: qty 1

## 2021-02-09 MED ORDER — PROPOFOL 10 MG/ML IV BOLUS
INTRAVENOUS | Status: DC | PRN
  Administered 2021-02-09: 200 mg via INTRAVENOUS

## 2021-02-09 MED ORDER — LORATADINE 10 MG PO TABS
10.0000 mg | ORAL_TABLET | Freq: Every day | ORAL | Status: DC
  Administered 2021-02-10: 10 mg via ORAL
  Filled 2021-02-09: qty 1

## 2021-02-09 MED ORDER — ONDANSETRON HCL 4 MG/2ML IJ SOLN
INTRAMUSCULAR | Status: AC
  Filled 2021-02-09: qty 2

## 2021-02-09 MED ORDER — ACETAMINOPHEN 325 MG PO TABS: 650.0000 mg | ORAL_TABLET | ORAL | Status: DC | PRN

## 2021-02-09 MED ORDER — ONDANSETRON HCL 4 MG/2ML IJ SOLN: 4.0000 mg | Freq: Four times a day (QID) | INTRAMUSCULAR | Status: DC | PRN

## 2021-02-09 MED ORDER — PHENYLEPHRINE 40 MCG/ML (10ML) SYRINGE FOR IV PUSH (FOR BLOOD PRESSURE SUPPORT)
PREFILLED_SYRINGE | INTRAVENOUS | Status: DC | PRN
  Administered 2021-02-09: 40 ug via INTRAVENOUS

## 2021-02-09 MED ORDER — FENTANYL CITRATE (PF) 100 MCG/2ML IJ SOLN
25.0000 ug | INTRAMUSCULAR | Status: DC | PRN
  Administered 2021-02-09 (×3): 50 ug via INTRAVENOUS

## 2021-02-09 MED ORDER — PHENOL 1.4 % MT LIQD: 1.0000 | OROMUCOSAL | Status: DC | PRN

## 2021-02-09 MED ORDER — GENTAMICIN IN SALINE 1-0.9 MG/ML-% IV SOLN
100.0000 mg | INTRAVENOUS | Status: AC
  Administered 2021-02-09: 100 mg via INTRAVENOUS
  Filled 2021-02-09: qty 100

## 2021-02-09 MED ORDER — ALBUTEROL SULFATE (2.5 MG/3ML) 0.083% IN NEBU: 2.5000 mg | INHALATION_SOLUTION | Freq: Once | RESPIRATORY_TRACT | Status: AC

## 2021-02-09 MED ORDER — DOCUSATE SODIUM 100 MG PO CAPS
100.0000 mg | ORAL_CAPSULE | Freq: Two times a day (BID) | ORAL | Status: DC
  Administered 2021-02-09 – 2021-02-10 (×2): 100 mg via ORAL
  Filled 2021-02-09 (×2): qty 1

## 2021-02-09 MED ORDER — FLUOXETINE HCL 20 MG PO CAPS
60.0000 mg | ORAL_CAPSULE | Freq: Every day | ORAL | Status: DC
  Administered 2021-02-10: 60 mg via ORAL
  Filled 2021-02-09: qty 3

## 2021-02-09 MED ORDER — MIDAZOLAM HCL 5 MG/5ML IJ SOLN
INTRAMUSCULAR | Status: DC | PRN
  Administered 2021-02-09: 2 mg via INTRAVENOUS

## 2021-02-09 MED ORDER — PANCRELIPASE (LIP-PROT-AMYL) 36000-114000 UNITS PO CPEP
144000.0000 [IU] | ORAL_CAPSULE | Freq: Two times a day (BID) | ORAL | Status: DC | PRN
  Filled 2021-02-09: qty 4

## 2021-02-09 MED ORDER — 0.9 % SODIUM CHLORIDE (POUR BTL) OPTIME
TOPICAL | Status: DC | PRN
  Administered 2021-02-09: 1000 mL

## 2021-02-09 MED ORDER — FENTANYL CITRATE (PF) 250 MCG/5ML IJ SOLN
INTRAMUSCULAR | Status: DC | PRN
  Administered 2021-02-09: 100 ug via INTRAVENOUS
  Administered 2021-02-09: 50 ug via INTRAVENOUS

## 2021-02-09 MED ORDER — TRAZODONE HCL 50 MG PO TABS
150.0000 mg | ORAL_TABLET | Freq: Every day | ORAL | Status: DC
  Administered 2021-02-09: 150 mg via ORAL
  Filled 2021-02-09: qty 3

## 2021-02-09 MED ORDER — ALBUTEROL SULFATE (2.5 MG/3ML) 0.083% IN NEBU: 2.5000 mg | INHALATION_SOLUTION | Freq: Four times a day (QID) | RESPIRATORY_TRACT | Status: DC | PRN

## 2021-02-09 MED ORDER — DEXAMETHASONE SODIUM PHOSPHATE 10 MG/ML IJ SOLN
INTRAMUSCULAR | Status: DC | PRN
  Administered 2021-02-09: 4 mg via INTRAVENOUS

## 2021-02-09 MED ORDER — LAMOTRIGINE 100 MG PO TABS
200.0000 mg | ORAL_TABLET | Freq: Two times a day (BID) | ORAL | Status: DC
  Administered 2021-02-09 – 2021-02-10 (×2): 200 mg via ORAL
  Filled 2021-02-09 (×2): qty 2

## 2021-02-09 MED ORDER — FENTANYL CITRATE (PF) 100 MCG/2ML IJ SOLN
INTRAMUSCULAR | Status: AC
  Filled 2021-02-09: qty 2

## 2021-02-09 MED ORDER — PHENYLEPHRINE HCL-NACL 10-0.9 MG/250ML-% IV SOLN
INTRAVENOUS | Status: DC | PRN
  Administered 2021-02-09: 40 ug/min via INTRAVENOUS

## 2021-02-09 MED ORDER — POLYETHYLENE GLYCOL 3350 17 G PO PACK: 17.0000 g | PACK | Freq: Every day | ORAL | 0 refills | Status: AC

## 2021-02-09 MED ORDER — LACTATED RINGERS IV SOLN: INTRAVENOUS | Status: DC

## 2021-02-09 MED ORDER — INSULIN ASPART 100 UNIT/ML IJ SOLN
0.0000 [IU] | Freq: Three times a day (TID) | INTRAMUSCULAR | Status: DC
  Administered 2021-02-09: 5 [IU] via SUBCUTANEOUS
  Administered 2021-02-10 (×2): 8 [IU] via SUBCUTANEOUS

## 2021-02-09 MED ORDER — ACETAMINOPHEN 650 MG RE SUPP: 650.0000 mg | RECTAL | Status: DC | PRN

## 2021-02-09 MED ORDER — GABAPENTIN 300 MG PO CAPS
600.0000 mg | ORAL_CAPSULE | Freq: Three times a day (TID) | ORAL | Status: DC
  Administered 2021-02-09 – 2021-02-10 (×3): 600 mg via ORAL
  Filled 2021-02-09 (×3): qty 2

## 2021-02-09 MED ORDER — BUPIVACAINE-EPINEPHRINE 0.5% -1:200000 IJ SOLN
INTRAMUSCULAR | Status: AC
  Filled 2021-02-09: qty 1

## 2021-02-09 MED ORDER — ACETAMINOPHEN 500 MG PO TABS
1000.0000 mg | ORAL_TABLET | Freq: Four times a day (QID) | ORAL | Status: AC
  Administered 2021-02-09 – 2021-02-10 (×4): 1000 mg via ORAL
  Filled 2021-02-09 (×4): qty 2

## 2021-02-09 MED ORDER — SODIUM CHLORIDE 7 % IN NEBU: 4.0000 mL | INHALATION_SOLUTION | Freq: Two times a day (BID) | RESPIRATORY_TRACT | Status: DC

## 2021-02-09 MED ORDER — ALBUTEROL SULFATE (2.5 MG/3ML) 0.083% IN NEBU
INHALATION_SOLUTION | RESPIRATORY_TRACT | Status: AC
  Administered 2021-02-09: 2.5 mg via RESPIRATORY_TRACT
  Filled 2021-02-09: qty 3

## 2021-02-09 MED ORDER — SODIUM CHLORIDE 3 % IN NEBU
4.0000 mL | INHALATION_SOLUTION | Freq: Two times a day (BID) | RESPIRATORY_TRACT | Status: DC
  Administered 2021-02-09: 4 mL via RESPIRATORY_TRACT
  Filled 2021-02-09 (×4): qty 4

## 2021-02-09 MED ORDER — ALUM & MAG HYDROXIDE-SIMETH 200-200-20 MG/5ML PO SUSP: 30.0000 mL | Freq: Four times a day (QID) | ORAL | Status: DC | PRN

## 2021-02-09 MED ORDER — MORPHINE SULFATE (PF) 2 MG/ML IV SOLN
0.5000 mg | INTRAVENOUS | Status: DC | PRN
  Administered 2021-02-10: 2 mg via INTRAVENOUS
  Filled 2021-02-09: qty 1

## 2021-02-09 MED ORDER — INSULIN ASPART 100 UNIT/ML IJ SOLN
INTRAMUSCULAR | Status: AC
  Filled 2021-02-09: qty 1

## 2021-02-09 MED ORDER — ACETAMINOPHEN 500 MG PO TABS: 1000.0000 mg | ORAL_TABLET | Freq: Once | ORAL | Status: DC

## 2021-02-09 MED ORDER — METHOCARBAMOL 500 MG PO TABS
500.0000 mg | ORAL_TABLET | Freq: Four times a day (QID) | ORAL | Status: DC | PRN
  Administered 2021-02-09 (×2): 500 mg via ORAL
  Filled 2021-02-09 (×2): qty 1

## 2021-02-09 MED ORDER — PANCRELIPASE (LIP-PROT-AMYL) 36000-114000 UNITS PO CPEP
288000.0000 [IU] | ORAL_CAPSULE | Freq: Three times a day (TID) | ORAL | Status: DC
  Administered 2021-02-09 – 2021-02-10 (×2): 288000 [IU] via ORAL
  Filled 2021-02-09 (×4): qty 8

## 2021-02-09 MED ORDER — OXYCODONE HCL 5 MG PO TABS
ORAL_TABLET | ORAL | Status: AC
  Filled 2021-02-09: qty 1

## 2021-02-09 MED ORDER — PROBIOTIC 250 MG PO CAPS: ORAL_CAPSULE | Freq: Every day | ORAL | Status: DC

## 2021-02-09 MED ORDER — THROMBIN 20000 UNITS EX SOLR
CUTANEOUS | Status: DC | PRN
  Administered 2021-02-09: 20 mL via TOPICAL

## 2021-02-09 MED ORDER — LIDOCAINE 2% (20 MG/ML) 5 ML SYRINGE
INTRAMUSCULAR | Status: AC
  Filled 2021-02-09: qty 5

## 2021-02-09 MED ORDER — PANTOPRAZOLE SODIUM 40 MG PO TBEC
40.0000 mg | DELAYED_RELEASE_TABLET | Freq: Every day | ORAL | Status: DC
  Administered 2021-02-10: 40 mg via ORAL
  Filled 2021-02-09: qty 1

## 2021-02-09 MED ORDER — VANCOMYCIN HCL 1500 MG/300ML IV SOLN
1500.0000 mg | INTRAVENOUS | Status: AC
  Administered 2021-02-09: 1500 mg via INTRAVENOUS
  Filled 2021-02-09: qty 300

## 2021-02-09 MED ORDER — OXYCODONE HCL 5 MG PO TABS
10.0000 mg | ORAL_TABLET | ORAL | Status: DC | PRN
  Administered 2021-02-09 – 2021-02-10 (×5): 10 mg via ORAL
  Filled 2021-02-09 (×6): qty 2

## 2021-02-09 MED ORDER — ALBUTEROL SULFATE HFA 108 (90 BASE) MCG/ACT IN AERS: 1.0000 | INHALATION_SPRAY | Freq: Four times a day (QID) | RESPIRATORY_TRACT | Status: DC | PRN

## 2021-02-09 MED ORDER — BUPIVACAINE-EPINEPHRINE 0.5% -1:200000 IJ SOLN
INTRAMUSCULAR | Status: DC | PRN
  Administered 2021-02-09: 6 mL

## 2021-02-09 MED ORDER — SUGAMMADEX SODIUM 200 MG/2ML IV SOLN
INTRAVENOUS | Status: DC | PRN
  Administered 2021-02-09: 200 mg via INTRAVENOUS

## 2021-02-09 MED ORDER — METHOCARBAMOL 1000 MG/10ML IJ SOLN
500.0000 mg | Freq: Four times a day (QID) | INTRAVENOUS | Status: DC | PRN
  Filled 2021-02-09: qty 5

## 2021-02-09 MED ORDER — DEXAMETHASONE SODIUM PHOSPHATE 10 MG/ML IJ SOLN
INTRAMUSCULAR | Status: AC
  Filled 2021-02-09: qty 1

## 2021-02-09 MED ORDER — POTASSIUM CHLORIDE IN NACL 20-0.45 MEQ/L-% IV SOLN
INTRAVENOUS | Status: DC
  Filled 2021-02-09 (×2): qty 1000

## 2021-02-09 MED ORDER — METHOCARBAMOL 500 MG PO TABS
ORAL_TABLET | ORAL | Status: AC
  Filled 2021-02-09: qty 1

## 2021-02-09 MED ORDER — PROMETHAZINE HCL 25 MG/ML IJ SOLN: 6.2500 mg | INTRAMUSCULAR | Status: DC | PRN

## 2021-02-09 MED ORDER — ROCURONIUM BROMIDE 10 MG/ML (PF) SYRINGE
PREFILLED_SYRINGE | INTRAVENOUS | Status: DC | PRN
  Administered 2021-02-09: 60 mg via INTRAVENOUS

## 2021-02-09 MED ORDER — PAROXETINE HCL 20 MG PO TABS: 20.0000 mg | ORAL_TABLET | Freq: Every day | ORAL | Status: DC

## 2021-02-09 MED ORDER — DOCUSATE SODIUM 100 MG PO CAPS: 100.0000 mg | ORAL_CAPSULE | Freq: Two times a day (BID) | ORAL | 1 refills | Status: AC | PRN

## 2021-02-09 MED ORDER — BISACODYL 5 MG PO TBEC: 5.0000 mg | DELAYED_RELEASE_TABLET | Freq: Every day | ORAL | Status: DC | PRN

## 2021-02-09 MED ORDER — CHLORHEXIDINE GLUCONATE 0.12 % MT SOLN
15.0000 mL | Freq: Once | OROMUCOSAL | Status: AC
  Filled 2021-02-09: qty 15

## 2021-02-09 MED ORDER — POLYETHYLENE GLYCOL 3350 17 G PO PACK: 17.0000 g | PACK | Freq: Every day | ORAL | Status: DC | PRN

## 2021-02-09 MED ORDER — MIDAZOLAM HCL 2 MG/2ML IJ SOLN
INTRAMUSCULAR | Status: AC
  Filled 2021-02-09: qty 2

## 2021-02-09 MED ORDER — LIDOCAINE 2% (20 MG/ML) 5 ML SYRINGE
INTRAMUSCULAR | Status: DC | PRN
  Administered 2021-02-09: 100 mg via INTRAVENOUS

## 2021-02-09 MED ORDER — ROCURONIUM BROMIDE 10 MG/ML (PF) SYRINGE
PREFILLED_SYRINGE | INTRAVENOUS | Status: AC
  Filled 2021-02-09: qty 10

## 2021-02-09 MED ORDER — ORAL CARE MOUTH RINSE: 15.0000 mL | Freq: Once | OROMUCOSAL | Status: AC

## 2021-02-09 MED ORDER — MAGNESIUM CITRATE PO SOLN: 1.0000 | Freq: Once | ORAL | Status: DC | PRN

## 2021-02-09 MED ORDER — FENTANYL CITRATE (PF) 250 MCG/5ML IJ SOLN
INTRAMUSCULAR | Status: AC
  Filled 2021-02-09: qty 5

## 2021-02-09 MED ORDER — OXYCODONE HCL 5 MG PO TABS: 5.0000 mg | ORAL_TABLET | ORAL | Status: DC | PRN

## 2021-02-09 MED ORDER — CHLORHEXIDINE GLUCONATE 0.12 % MT SOLN
OROMUCOSAL | Status: AC
  Administered 2021-02-09: 15 mL via OROMUCOSAL
  Filled 2021-02-09: qty 15

## 2021-02-09 MED ORDER — MENTHOL 3 MG MT LOZG: 1.0000 | LOZENGE | OROMUCOSAL | Status: DC | PRN

## 2021-02-09 MED ORDER — OXYCODONE HCL 5 MG PO TABS: 5.0000 mg | ORAL_TABLET | ORAL | 0 refills | Status: AC | PRN

## 2021-02-09 MED ORDER — ALBUTEROL SULFATE (2.5 MG/3ML) 0.083% IN NEBU
2.5000 mg | INHALATION_SOLUTION | Freq: Two times a day (BID) | RESPIRATORY_TRACT | Status: DC
  Administered 2021-02-09 – 2021-02-10 (×2): 2.5 mg via RESPIRATORY_TRACT
  Filled 2021-02-09 (×2): qty 3

## 2021-02-09 MED ORDER — DORNASE ALFA 2.5 MG/2.5ML IN SOLN
2.5000 mg | Freq: Every morning | RESPIRATORY_TRACT | Status: DC
  Administered 2021-02-10: 2.5 mg via RESPIRATORY_TRACT
  Filled 2021-02-09: qty 2.5

## 2021-02-09 MED ORDER — THROMBIN 20000 UNITS EX SOLR
CUTANEOUS | Status: AC
  Filled 2021-02-09: qty 20000

## 2021-02-09 MED ORDER — VANCOMYCIN HCL 1250 MG/250ML IV SOLN
1250.0000 mg | Freq: Once | INTRAVENOUS | Status: AC
  Administered 2021-02-09: 1250 mg via INTRAVENOUS
  Filled 2021-02-09: qty 250

## 2021-02-09 MED ORDER — PROPOFOL 10 MG/ML IV BOLUS
INTRAVENOUS | Status: AC
  Filled 2021-02-09: qty 20

## 2021-02-09 MED ORDER — MOMETASONE FURO-FORMOTEROL FUM 200-5 MCG/ACT IN AERO
2.0000 | INHALATION_SPRAY | Freq: Two times a day (BID) | RESPIRATORY_TRACT | Status: DC
  Administered 2021-02-09 – 2021-02-10 (×2): 2 via RESPIRATORY_TRACT
  Filled 2021-02-09: qty 8.8

## 2021-02-09 SURGICAL SUPPLY — 59 items
BAG COUNTER SPONGE SURGICOUNT (BAG) ×2 IMPLANT
BAG DECANTER FOR FLEXI CONT (MISCELLANEOUS) ×3 IMPLANT
BAG SURGICOUNT SPONGE COUNTING (BAG) ×1
BAND RUBBER #18 3X1/16 STRL (MISCELLANEOUS) ×6 IMPLANT
BUR STRYKR EGG 5.0 (BURR) IMPLANT
CLEANER TIP ELECTROSURG 2X2 (MISCELLANEOUS) ×3 IMPLANT
CLOSURE WOUND 1/2 X4 (GAUZE/BANDAGES/DRESSINGS) ×1
CLOSURE WOUND 1/4 X3 (GAUZE/BANDAGES/DRESSINGS) ×1
CNTNR URN SCR LID CUP LEK RST (MISCELLANEOUS) ×1 IMPLANT
CONT SPEC 4OZ STRL OR WHT (MISCELLANEOUS) ×2
DRAPE LAPAROTOMY 100X72X124 (DRAPES) ×3 IMPLANT
DRAPE MICROSCOPE LEICA (MISCELLANEOUS) ×3 IMPLANT
DRAPE SHEET LG 3/4 BI-LAMINATE (DRAPES) ×3 IMPLANT
DRAPE SURG 17X11 SM STRL (DRAPES) ×3 IMPLANT
DRAPE UTILITY XL STRL (DRAPES) ×3 IMPLANT
DRESSING AQUACEL AG SP 3.5X4 (GAUZE/BANDAGES/DRESSINGS) ×1 IMPLANT
DRSG AQUACEL AG ADV 3.5X 4 (GAUZE/BANDAGES/DRESSINGS) ×3 IMPLANT
DRSG AQUACEL AG ADV 3.5X 6 (GAUZE/BANDAGES/DRESSINGS) IMPLANT
DRSG AQUACEL AG SP 3.5X4 (GAUZE/BANDAGES/DRESSINGS) ×3
DRSG TELFA 3X8 NADH (GAUZE/BANDAGES/DRESSINGS) IMPLANT
DURAPREP 26ML APPLICATOR (WOUND CARE) ×3 IMPLANT
DURASEAL SPINE SEALANT 3ML (MISCELLANEOUS) IMPLANT
ELECT BLADE 4.0 EZ CLEAN MEGAD (MISCELLANEOUS)
ELECT REM PT RETURN 9FT ADLT (ELECTROSURGICAL) ×3
ELECTRODE BLDE 4.0 EZ CLN MEGD (MISCELLANEOUS) IMPLANT
ELECTRODE REM PT RTRN 9FT ADLT (ELECTROSURGICAL) ×1 IMPLANT
GLOVE SURG POLYISO LF SZ7.5 (GLOVE) ×3 IMPLANT
GLOVE SURG POLYISO LF SZ8 (GLOVE) ×6 IMPLANT
GLOVE SURG UNDER POLY LF SZ7 (GLOVE) ×3 IMPLANT
GOWN STRL REUS W/ TWL LRG LVL3 (GOWN DISPOSABLE) ×4 IMPLANT
GOWN STRL REUS W/ TWL XL LVL3 (GOWN DISPOSABLE) ×2 IMPLANT
GOWN STRL REUS W/TWL LRG LVL3 (GOWN DISPOSABLE) ×8
GOWN STRL REUS W/TWL XL LVL3 (GOWN DISPOSABLE) ×4
IV CATH 14GX2 1/4 (CATHETERS) ×3 IMPLANT
KIT BASIN OR (CUSTOM PROCEDURE TRAY) ×3 IMPLANT
NEEDLE 22X1 1/2 (OR ONLY) (NEEDLE) ×3 IMPLANT
NEEDLE SPNL 18GX3.5 QUINCKE PK (NEEDLE) ×6 IMPLANT
PACK LAMINECTOMY NEURO (CUSTOM PROCEDURE TRAY) ×3 IMPLANT
PATTIES SURGICAL .75X.75 (GAUZE/BANDAGES/DRESSINGS) ×3 IMPLANT
PATTIES SURGICAL 1/4 X 3 (GAUZE/BANDAGES/DRESSINGS) ×3 IMPLANT
SPONGE SURGIFOAM ABS GEL 100 (HEMOSTASIS) ×3 IMPLANT
SPONGE T-LAP 4X18 ~~LOC~~+RFID (SPONGE) IMPLANT
STAPLER VISISTAT (STAPLE) IMPLANT
STRIP CLOSURE SKIN 1/2X4 (GAUZE/BANDAGES/DRESSINGS) ×2 IMPLANT
STRIP CLOSURE SKIN 1/4X3 (GAUZE/BANDAGES/DRESSINGS) ×2 IMPLANT
SUT NURALON 4 0 TR CR/8 (SUTURE) IMPLANT
SUT PROLENE 3 0 PS 2 (SUTURE) ×3 IMPLANT
SUT VIC AB 1 CT1 27 (SUTURE)
SUT VIC AB 1 CT1 27XBRD ANTBC (SUTURE) IMPLANT
SUT VIC AB 1-0 CT2 27 (SUTURE) ×3 IMPLANT
SUT VIC AB 2-0 CT1 27 (SUTURE)
SUT VIC AB 2-0 CT1 TAPERPNT 27 (SUTURE) IMPLANT
SUT VIC AB 2-0 CT2 27 (SUTURE) ×3 IMPLANT
SYR 3ML LL SCALE MARK (SYRINGE) ×3 IMPLANT
TOWEL GREEN STERILE (TOWEL DISPOSABLE) ×3 IMPLANT
TOWEL GREEN STERILE FF (TOWEL DISPOSABLE) ×3 IMPLANT
TRAY FOLEY MTR SLVR 16FR STAT (SET/KITS/TRAYS/PACK) IMPLANT
TRAY FOLEY W/BAG SLVR 14FR (SET/KITS/TRAYS/PACK) ×3 IMPLANT
YANKAUER SUCT BULB TIP NO VENT (SUCTIONS) ×3 IMPLANT

## 2021-02-09 NOTE — Progress Notes (Signed)
Patient received Novolog insulin 8 units [place] per MD verbal order.

## 2021-02-09 NOTE — H&P (Signed)
James Proctor is an 34 y.o. male.   Chief Complaint: back and leg pain HPI: Reason for Visit: low back Context: The patient is 4 months out Location (Lower Extremity): lower back pain on the right; leg pain on the right, , ; foot pain on the right, , Aggravating Factors: standing for ; walking for ; sitting for Associated Symptoms: numbness/tingling (right foot) Medications: helping a little; Hydrocodone Notes: The patient is 7 weeks, 2 days following ESI right L5-S1. Minimal help from the epidural.  Past Medical History:  Diagnosis Date   Asthma    as a child, no longer treated for Asthma   Cystic fibrosis (HCC)    Diabetes mellitus, type II (HCC)    History of kidney stones    History of recurrent deep vein thrombosis (DVT)    related to PICC line   Hypertension    Pancreatic insufficiency due to cystic fibrosis (HCC)    Pneumonia    Sleep apnea    History of OSA, but no longer uses CPAP since weight loss    Past Surgical History:  Procedure Laterality Date   LOBECTOMY Right 03/2017   partial   NASAL SINUS SURGERY     approx 2014    Family History  Problem Relation Age of Onset   Cystic fibrosis Neg Hx    Social History:  reports that he has never smoked. His smokeless tobacco use includes snuff. He reports current alcohol use. He reports that he does not use drugs.  Allergies:  Allergies  Allergen Reactions   [place] [Aztreonam] Anaphylaxis   Cefepime Itching, Nausea Only and Other (See Comments)    Headaches also   Tobramycin Tinitus    Pt has received Tobramycin at [facility] since this allergy documented   Banana Itching and Nausea And Vomiting   Theophyllines Itching    Itchy throat   Current meds albuterol sulfate concentrate 2.5 mg/0.5 mL solution for nebulization albuterol sulfate HFA 90 mcg/actuation aerosol inhaler amitriptyline 100 mg tablet amoxicillin 875 mg-potassium clavulanate 125 mg tablet atorvastatin 20 mg tablet baclofen 10 mg  tablet budesonide-formoterol HFA 160 mcg-4.5 mcg/actuation aerosol inhaler cetirizine 10 mg tablet chlorhexidine gluconate 4 % topical liquid famotidine 20 mg tablet FLUoxetine 60 mg tablet gabapentin 600 mg tablet HYDROcodone 5 mg-acetaminophen 325 mg tablet insulin aspart (U-100) 100 unit/mL (3 mL) subcutaneous pen lamoTRIgine 200 mg tablet levoFLOXacin 500 mg tablet lisinopriL 10 mg tablet montelukast 10 mg tablet mupirocin 2 % topical ointment neomycin-polymyxin-hydrocort 3.5 mg/mL-10,000 unit/mL-1 % ear solution omeprazole 20 mg capsule,delayed release pramipexole 0.125 mg tablet sodium chloride 7 % for nebulization sulfamethoxazole 800 mg-trimethoprim 160 mg tablet tamsulosin 0.4 mg capsule traZODone 150 mg tablet Trikafta 100-50-75 mg (d)/150 mg (n) tablets Xarelto 20 mg tablet   Results for orders placed or performed during the hospital encounter of 02/09/21 (from the past 48 hour(s))  Glucose, capillary     Status: Abnormal   Collection Time: 02/09/21  8:15 AM  Result Value Ref Range   Glucose-Capillary 261 (H) 70 - 99 mg/dL    Comment: Glucose reference range applies only to samples taken after fasting for at least 8 hours.   Comment 1 Notify RN    No results found.  Review of Systems  Constitutional: Negative.   HENT: Negative.    Eyes: Negative.   Respiratory: Negative.    Cardiovascular: Negative.   Gastrointestinal: Negative.   Endocrine: Negative.   Genitourinary: Negative.   Musculoskeletal:  Positive for back pain.  Neurological:  Positive for weakness and numbness.   There were no vitals taken for this visit. Physical Exam Constitutional:      Appearance: Normal appearance.  HENT:     Head: Normocephalic.     Right Ear: External ear normal.     Left Ear: External ear normal.     Nose: Nose normal.     Mouth/Throat:     Pharynx: Oropharynx is clear.  Eyes:     Conjunctiva/sclera: Conjunctivae normal.  Cardiovascular:     Rate and Rhythm:  Normal rate.     Pulses: Normal pulses.  Pulmonary:     Effort: Pulmonary effort is normal.  Abdominal:     General: Bowel sounds are normal.  Musculoskeletal:     Cervical back: Normal range of motion.     Comments: Gait and Station: Appearance: ambulating with no assistive devices and antalgic gait.  Constitutional: General Appearance: healthy-appearing and distress (mild).  Psychiatric: Mood and Affect: active and alert.  Cardiovascular System: Edema Right: none; Dorsalis and posterior tibial pulses 2+. Edema Left: none.  Abdomen: Inspection and Palpation: non-distended and no tenderness.  Skin: Inspection and palpation: no rash.  Lumbar Spine: Inspection: normal alignment. Bony Palpation of the Lumbar Spine: tender at lumbosacral junction.. Bony Palpation of the Right Hip: no tenderness of the greater trochanter and tenderness of the SI joint; Pelvis stable. Bony Palpation of the Left Hip: no tenderness of the greater trochanter and tenderness of the SI joint. Soft Tissue Palpation on the Right: No flank pain with percussion. Active Range of Motion: limited flexion and extention.  Motor Strength: L1 Motor Strength on the Right: hip flexion iliopsoas 5/5. L1 Motor Strength on the Left: hip flexion iliopsoas 5/5. L2-L4 Motor Strength on the Right: knee extension quadriceps 5/5. L2-L4 Motor Strength on the Left: knee extension quadriceps 5/5. L5 Motor Strength on the Right: ankle dorsiflexion tibialis anterior 5/5 and great toe extension extensor hallucis longus 5/5. L5 Motor Strength on the Left: ankle dorsiflexion tibialis anterior 5/5 and great toe extension extensor hallucis longus 5/5. S1 Motor Strength on the Right: plantar flexion gastrocnemius 5/5. S1 Motor Strength on the Left: plantar flexion gastrocnemius 5/5.  Neurological System: Knee Reflex Right: normal (2). Knee Reflex Left: normal (2). Ankle Reflex Right: normal (2). Ankle Reflex Left: normal (2). Babinski Reflex Right:  plantar reflex absent. Babinski Reflex Left: plantar reflex absent. Sensation on the Right: normal distal extremities. Sensation on the Left: normal distal extremities. Special Tests on the Right: no clonus of the ankle/knee and seated straight leg raising test positive. Special Tests on the Left: no clonus of the ankle/knee.  Skin:    General: Skin is warm and dry.  Neurological:     Mental Status: He is alert.    MRI reviewed by myself demonstrates a moderate disc herniation L5-S1 displacing the right S1 nerve root. Bulging disc at L3-4 and L4-5.  Assessment/Plan Impression:  Right lower extremity radicular pain secondary disc herniation L5-S1 with a slight EHL weakness and diminished repetitive plantarflexion. 4 months duration. Minimal help from an epidural  History of cystic fibrosis and cystic fibrosis diabetes. Currently on insulin. Is blood glucose levels are well controlled.  Plan:  We discussed options and the patient cannot continue to live with the symptoms of where they are.  We discussed options including a microlumbar decompression he would like to proceed with that. He did physical therapy in the hospital when he was admitted for his cystic fibrosis.  We can have him  see one of our spine therapist for a few visits for perioperative discretion and a few therapy sessions to see if it makes any difference.  With refractory symptoms we discussed microlumbar decompression I had an extensive discussion with the patient concerning the pathology relevant anatomy and treatment options. At this point exhausting conservative treatment and in the presence of a neurologic deficit we discussed microlumbar decompression. I discussed the risks and benefits including bleeding, infection, DVT, PE, anesthetic complications, worsening in their symptoms, improvement in their symptoms, C SF leakage, epidural fibrosis, need for future surgeries such as revision discectomy and lumbar fusion. I also  indicated that this is an operation to basically decompress the nerve root to allow recovery as opposed to fixing a herniated disc and that the incidence of recurrent chest disc herniation can approach 15%. Also that nerve root recovery is variable and may not recover completely.  I discussed the operative course including overnight in the hospital. Immediate ambulation. Follow-up in 2 weeks for suture removal. 6 weeks until healing of the herniation followed by 6 weeks of reconditioning and strengthening of the core musculature. Also discussed the need to employ the concepts of disc pressure management and core motion following the surgery to minimize the risk of recurrent disc herniation. We will obtain preoperative clearance i if necessary and proceed accordingly.  Patient has had a history of MRSA. Also he has cystic fibrosis of the lung. He periodically has infections. He is colonized organism was apparently Pseudomonas. Patient is a non-smoker. No history of DVT. We will obtain preoperative clearance from his pulmonologist. His blood glucose levels are well controlled. He is on Xarelto and would have to be off that at least 2 days before his surgery and for a week following that.  Continue with his current analgesics he is on hydrocodone. Preoperative decolonization   Dorothy Spark, PA-C 02/09/2021, 10:06 AM

## 2021-02-09 NOTE — Discharge Instructions (Signed)

## 2021-02-09 NOTE — Anesthesia Procedure Notes (Signed)
Procedure Name: Intubation Date/Time: 02/09/2021 10:30 AM Performed by: Kyung Rudd, CRNA Pre-anesthesia Checklist: Patient identified, Emergency Drugs available, Suction available and Patient being monitored Patient Re-evaluated:Patient Re-evaluated prior to induction Oxygen Delivery Method: Circle System Utilized Preoxygenation: Pre-oxygenation with 100% oxygen Induction Type: IV induction Ventilation: Mask ventilation without difficulty Laryngoscope Size: Mac and 4 Grade View: Grade I Tube type: Oral Tube size: 7.5 mm Number of attempts: 1 Airway Equipment and Method: Stylet Placement Confirmation: ETT inserted through vocal cords under direct vision, positive ETCO2 and breath sounds checked- equal and bilateral Secured at: 23 cm Tube secured with: Tape Dental Injury: Teeth and Oropharynx as per pre-operative assessment  Comments: Performed by Heide Scales, SRNA under direct supervision by Dr. Ola Spurr and Rhae Lerner, CRNA

## 2021-02-09 NOTE — Brief Op Note (Signed)
02/09/2021  12:51 PM  PATIENT:  James Proctor  35 y.o. male  PRE-OPERATIVE DIAGNOSIS:  Stenosis herniated Lumbar five sacral one right  POST-OPERATIVE DIAGNOSIS:  Stenosis herniated Lumbar five sacral one right  PROCEDURE:  Procedure(s): Microdiscectomy, decompression Lumbar five-Sacral one Right (Right)  SURGEON:  Surgeon(s) and Role:    Jene Every, MD - Primary  PHYSICIAN ASSISTANT:   ASSISTANTS: [place]   ANESTHESIA:   spinal  EBL:  50 mL   BLOOD ADMINISTERED:none  DRAINS: none   LOCAL MEDICATIONS USED:  MARCAINE     SPECIMEN:  No Specimen  DISPOSITION OF SPECIMEN:  N/A  COUNTS:  YES  TOURNIQUET:  * No tourniquets in log *  DICTATION: .Other Dictation: Dictation Number [id number]  PLAN OF CARE: Admit for overnight observation  PATIENT DISPOSITION:  PACU - hemodynamically stable.   Delay start of Pharmacological VTE agent (>24hrs) due to surgical blood loss or risk of bleeding: yes

## 2021-02-09 NOTE — Progress Notes (Signed)
Pharmacy Antibiotic Note  James Proctor is a 35 y.o. male admitted on 02/09/2021 with surgical prophylaxis.  Pharmacy has been consulted for Vancomycin dosing x 1 dose post-op lumbar surgery.  No drain. Cephalosporin allergy.     Vancomycin 1500 mg IV given pre-op at 0857  Plan:  Vancomycin 1250 mg IV x 1 at 9pm tonight.  No follow up needed.  Pharmacy signing off.  Height: 6' (182.9 cm) Weight: 103.4 kg (228 lb) IBW/kg (Calculated) : 77.6  Temp (24hrs), Avg:97.9 F (36.6 C), Min:97 F (36.1 C), Max:98.5 F (36.9 C)  Recent Labs  Lab 02/06/21 1627  WBC 7.6  CREATININE 0.78    Estimated Creatinine Clearance: 164.8 mL/min (by C-G formula based on SCr of 0.78 mg/dL).    Allergies  Allergen Reactions   Cayston [Aztreonam] Anaphylaxis   Cefepime Itching, Nausea Only and Other (See Comments)    Headaches also   Tobramycin Tinitus    Pt [facility] since this allergy documented   Banana Itching and Nausea And Vomiting   Theophyllines Itching    Itchy throat    Thank you for allowing pharmacy to be a part of this patient's care.  Dennie Fetters, [place] 02/09/2021 5:43 PM

## 2021-02-09 NOTE — Op Note (Signed)
NAME: James Proctor, James Proctor MEDICAL RECORD NO: [id number] ACCOUNT NO: [id number] DATE OF BIRTH: 1986-07-11 FACILITY: [facility] LOCATION: [facility] PHYSICIAN: Javier Docker, MD  Operative Report   DATE OF PROCEDURE: 02/09/2021  PREOPERATIVE DIAGNOSIS:  HNP, L5-S1, right.  POSTOPERATIVE DIAGNOSES: 1.  HNP, L5-S1, right. 2.  Epidural venous plexus.  PROCEDURE PERFORMED:   1.  Microlumbar decompression, L5-S1, right. 2.  Foraminotomies L5-S1, right. 3.  Microdiskectomy L5-S1, right. 4.  Lysis of epidural venous plexus L5-S1, right.  ANESTHESIA:  General.  ASSISTANT:  Andrez Grime, PA  HISTORY:  This is a 35 year old male with persistent refractory to severe right lower extremity radicular pain secondary to disk herniation, displacing the S1 nerve root.  He had neural tension signs, diminished repetitive plantarflexion.  Despite  conservative treatment, he was indicated for microlumbar decompression, L5-S1 due to the severity of his pain and neurologic deficit.  Risks and benefits were discussed including bleeding, infection, damage to neurovascular structures, no change in  symptoms, worsening symptoms, DVT, PE, anesthetic complications, etc.  DESCRIPTION OF PROCEDURE:  With the patient in supine position after induction of adequate general anesthesia, vancomycin and gentamicin for antimicrobial prophylaxis, Foley to gravity, was placed prone on a Wilson frame.  All bony prominences were well  padded.  Lumbar region was prepped and draped in the usual sterile fashion.  Two 18-gauge spinal needle was utilized to localize the L5-S1 interspace, confirmed with x-ray.  Incision was made from the spinous process of L5-S1.  Subcutaneous tissue was  dissected with electrocautery was utilized to achieve hemostasis.  Dorsal lumbar fascia divided in line with skin incision.  Paraspinous muscle elevated from the lamina 5 and S1.  McCulloch retractor placed.  Operating microscope was draped and brought   in the surgical field.  A confirmatory radiograph was obtained.  A straight micro curette was utilized to detach ligamentum flavum from the cephalad edge of S1.  Hemilaminotomy of the caudad edge of L5 was performed.  Penfield 4 was placed beneath the  ligamentum flavum to protect the neural elements.  Detached ligamentum flavum from the cephalad edge of S1.  The foraminotomy was performed.  I removed ligamentum flavum from the interspace. There was significant epidural venous plexus noted.  An  epidural lipomatosis.  I protected the neural elements and a vascular leash with D'Errico.  I then decompressed the lateral recess to the medial border of the pedicle.  Next, there was a vascular release extending from the pedicle.  We lysed the venous  plexus that was tethering the S1 nerve root.  I gently mobilized it medially.  I continued it cephalad.  There was compression of the lateral aspect of the thecal sac at the disk space.  I therefore went above the disk space to detach ligamentum flavum  from the caudad edge of L5.  I mobilized the soft tissue medially protecting the neural elements.  I then exposed the disk and placed patty cephalad and caudad to compress the venous plexus.  Protecting the neural elements HNP was noted.  I obtained a  confirmatory radiograph.  I performed an annulotomy and copious portion of disk material was removed from the disk space with a micropituitary, further mobilized with an Epstein and a nerve hook.  Multiple fragments were retrieved.  All herniated  material was excised.  I lavaged the disk space with catheter lavage and removed additional fragments.  Tethering vein was cauterized and mobilized.  After full diskectomy was performed a 1 cm of excursion of  the S1 nerve root medially pedicle without  tension.  A neuro probe was placed out the foramen of S1 and L5 and found to be widely patent.  I placed bone wax in all the cancellous surfaces.  Thrombin-soaked Gelfoam in the  wound and obtained strict hemostasis.  I removed the thrombin-soaked  Gelfoam, irrigated it copiously.  Good restoration of the thecal sac.  Good mobilization of the S1 nerve root.  I checked beneath the thecal sac, the axilla over the root, the foramen, no residual disk herniation noted.  No evidence of CSF leakage or  active bleeding.  Prominent epidural veins lateral to the S1 nerve root, I covered with a very small 2 x 2 mm thrombin-soaked Gelfoam.  The combination of the epidural venous plexus disk herniation in the epidural fat were compressing the root initially.   Next, I removed the McCulloch retractor, irrigated the paraspinous musculature, cauterized any minor bleeding.  I then without any bleeding, closed the dorsal lumbar fascia with #1 Vicryl interrupted figure-of-eight suture, subcutaneous with 2-0 and  skin with Prolene.  Sterile dressing applied, placed supine on the hospital bed, extubated without difficulty and transported to the recovery room in satisfactory condition.  The patient tolerated the procedure well.  No complications.  BLOOD LOSS:  50 mL.   PUS D: 02/09/2021 12:59:29 pm T: 02/09/2021 5:02:00 pm  JOB: [id number]/ [id number]

## 2021-02-09 NOTE — Interval H&P Note (Signed)
History and Physical Interval Note:  02/09/2021 10:10 AM  James Proctor  has presented today for surgery, with the diagnosis of Stenosis herniated L5-S1 right.  The various methods of treatment have been discussed with the patient and family. After consideration of risks, benefits and other options for treatment, the patient has consented to  Procedure(s): Microdiscectomy, decompression L5-S1 Right (Right) as a surgical intervention.  The patient's history has been reviewed, patient examined, no change in status, stable for surgery.  I have reviewed the patient's chart and labs.  Questions were answered to the patient's satisfaction.     Javier Docker

## 2021-02-09 NOTE — Progress Notes (Signed)
Dr. Glade Stanford was notified about patient James Proctor

## 2021-02-09 NOTE — TOC Progression Note (Signed)
Transition of Care [facility]) - Progression Note    Patient Details  Name: James Proctor MRN: [id number] Date of Birth: March 25, 1986  [facility] CM/SW Contact  Ralene Bathe, LCSWA Phone Number: 02/09/2021, 4:43 PM  Clinical Narrative:    Microdiscectomy, decompression Lumbar five-Sacral one Right (Right)  TOC following patient for any d/c planning needs once medically stable.  Pending PT/OT recommendations   Cleon Gustin, MSW, LCSWA         Expected Discharge Plan and Services                                                 Social Determinants of Health (SDOH) Interventions    Readmission Risk Interventions No flowsheet data found.

## 2021-02-09 NOTE — H&P (View-Only) (Signed)
James Proctor is an 34 y.o. male.   Chief Complaint: back and leg pain HPI: Reason for Visit: low back Context: The patient is 4 months out Location (Lower Extremity): lower back pain on the right; leg pain on the right, , ; foot pain on the right, , Aggravating Factors: standing for ; walking for ; sitting for Associated Symptoms: numbness/tingling (right foot) Medications: helping a little; Hydrocodone Notes: The patient is 7 weeks, 2 days following ESI right L5-S1. Minimal help from the epidural.  Past Medical History:  Diagnosis Date   Asthma    as a child, no longer treated for Asthma   Cystic fibrosis (HCC)    Diabetes mellitus, type II (HCC)    History of kidney stones    History of recurrent deep vein thrombosis (DVT)    related to PICC line   Hypertension    Pancreatic insufficiency due to cystic fibrosis (HCC)    Pneumonia    Sleep apnea    History of OSA, but no longer uses CPAP since weight loss    Past Surgical History:  Procedure Laterality Date   LOBECTOMY Right 03/2017   partial   NASAL SINUS SURGERY     approx 2014    Family History  Problem Relation Age of Onset   Cystic fibrosis Neg Hx    Social History:  reports that he has never smoked. His smokeless tobacco use includes snuff. He reports current alcohol use. He reports that he does not use drugs.  Allergies:  Allergies  Allergen Reactions   Cayston [Aztreonam] Anaphylaxis   Cefepime Itching, Nausea Only and Other (See Comments)    Headaches also   Tobramycin Tinitus    Pt has received Tobramycin at [facility] since this allergy documented   Banana Itching and Nausea And Vomiting   Theophyllines Itching    Itchy throat   Current meds albuterol sulfate concentrate 2.5 mg/0.5 mL solution for nebulization albuterol sulfate HFA 90 mcg/actuation aerosol inhaler amitriptyline 100 mg tablet amoxicillin 875 mg-potassium clavulanate 125 mg tablet atorvastatin 20 mg tablet baclofen 10 mg  tablet budesonide-formoterol HFA 160 mcg-4.5 mcg/actuation aerosol inhaler cetirizine 10 mg tablet chlorhexidine gluconate 4 % topical liquid famotidine 20 mg tablet FLUoxetine 60 mg tablet gabapentin 600 mg tablet HYDROcodone 5 mg-acetaminophen 325 mg tablet insulin aspart (U-100) 100 unit/mL (3 mL) subcutaneous pen lamoTRIgine 200 mg tablet levoFLOXacin 500 mg tablet lisinopriL 10 mg tablet montelukast 10 mg tablet mupirocin 2 % topical ointment neomycin-polymyxin-hydrocort 3.5 mg/mL-10,000 unit/mL-1 % ear solution omeprazole 20 mg capsule,delayed release pramipexole 0.125 mg tablet sodium chloride 7 % for nebulization sulfamethoxazole 800 mg-trimethoprim 160 mg tablet tamsulosin 0.4 mg capsule traZODone 150 mg tablet Trikafta 100-50-75 mg (d)/150 mg (n) tablets Xarelto 20 mg tablet   Results for orders placed or performed during the hospital encounter of 02/09/21 (from the past 48 hour(s))  Glucose, capillary     Status: Abnormal   Collection Time: 02/09/21  8:15 AM  Result Value Ref Range   Glucose-Capillary 261 (H) 70 - 99 mg/dL    Comment: Glucose reference range applies only to samples taken after fasting for at least 8 hours.   Comment 1 Notify RN    No results found.  Review of Systems  Constitutional: Negative.   HENT: Negative.    Eyes: Negative.   Respiratory: Negative.    Cardiovascular: Negative.   Gastrointestinal: Negative.   Endocrine: Negative.   Genitourinary: Negative.   Musculoskeletal:  Positive for back pain.  Neurological:  Positive for weakness and numbness.   There were no vitals taken for this visit. Physical Exam Constitutional:      Appearance: Normal appearance.  HENT:     Head: Normocephalic.     Right Ear: External ear normal.     Left Ear: External ear normal.     Nose: Nose normal.     Mouth/Throat:     Pharynx: Oropharynx is clear.  Eyes:     Conjunctiva/sclera: Conjunctivae normal.  Cardiovascular:     Rate and Rhythm:  Normal rate.     Pulses: Normal pulses.  Pulmonary:     Effort: Pulmonary effort is normal.  Abdominal:     General: Bowel sounds are normal.  Musculoskeletal:     Cervical back: Normal range of motion.     Comments: Gait and Station: Appearance: ambulating with no assistive devices and antalgic gait.  Constitutional: General Appearance: healthy-appearing and distress (mild).  Psychiatric: Mood and Affect: active and alert.  Cardiovascular System: Edema Right: none; Dorsalis and posterior tibial pulses 2+. Edema Left: none.  Abdomen: Inspection and Palpation: non-distended and no tenderness.  Skin: Inspection and palpation: no rash.  Lumbar Spine: Inspection: normal alignment. Bony Palpation of the Lumbar Spine: tender at lumbosacral junction.. Bony Palpation of the Right Hip: no tenderness of the greater trochanter and tenderness of the SI joint; Pelvis stable. Bony Palpation of the Left Hip: no tenderness of the greater trochanter and tenderness of the SI joint. Soft Tissue Palpation on the Right: No flank pain with percussion. Active Range of Motion: limited flexion and extention.  Motor Strength: L1 Motor Strength on the Right: hip flexion iliopsoas 5/5. L1 Motor Strength on the Left: hip flexion iliopsoas 5/5. L2-L4 Motor Strength on the Right: knee extension quadriceps 5/5. L2-L4 Motor Strength on the Left: knee extension quadriceps 5/5. L5 Motor Strength on the Right: ankle dorsiflexion tibialis anterior 5/5 and great toe extension extensor hallucis longus 5/5. L5 Motor Strength on the Left: ankle dorsiflexion tibialis anterior 5/5 and great toe extension extensor hallucis longus 5/5. S1 Motor Strength on the Right: plantar flexion gastrocnemius 5/5. S1 Motor Strength on the Left: plantar flexion gastrocnemius 5/5.  Neurological System: Knee Reflex Right: normal (2). Knee Reflex Left: normal (2). Ankle Reflex Right: normal (2). Ankle Reflex Left: normal (2). Babinski Reflex Right:  plantar reflex absent. Babinski Reflex Left: plantar reflex absent. Sensation on the Right: normal distal extremities. Sensation on the Left: normal distal extremities. Special Tests on the Right: no clonus of the ankle/knee and seated straight leg raising test positive. Special Tests on the Left: no clonus of the ankle/knee.  Skin:    General: Skin is warm and dry.  Neurological:     Mental Status: He is alert.    MRI reviewed by myself demonstrates a moderate disc herniation L5-S1 displacing the right S1 nerve root. Bulging disc at L3-4 and L4-5.  Assessment/Plan Impression:  Right lower extremity radicular pain secondary disc herniation L5-S1 with a slight EHL weakness and diminished repetitive plantarflexion. 4 months duration. Minimal help from an epidural  History of cystic fibrosis and cystic fibrosis diabetes. Currently on insulin. Is blood glucose levels are well controlled.  Plan:  We discussed options and the patient cannot continue to live with the symptoms of where they are.  We discussed options including a microlumbar decompression he would like to proceed with that. He did physical therapy in the hospital when he was admitted for his cystic fibrosis.  We can have him  see one of our spine therapist for a few visits for perioperative discretion and a few therapy sessions to see if it makes any difference.  With refractory symptoms we discussed microlumbar decompression I had an extensive discussion with the patient concerning the pathology relevant anatomy and treatment options. At this point exhausting conservative treatment and in the presence of a neurologic deficit we discussed microlumbar decompression. I discussed the risks and benefits including bleeding, infection, DVT, PE, anesthetic complications, worsening in their symptoms, improvement in their symptoms, C SF leakage, epidural fibrosis, need for future surgeries such as revision discectomy and lumbar fusion. I also  indicated that this is an operation to basically decompress the nerve root to allow recovery as opposed to fixing a herniated disc and that the incidence of recurrent chest disc herniation can approach 15%. Also that nerve root recovery is variable and may not recover completely.  I discussed the operative course including overnight in the hospital. Immediate ambulation. Follow-up in 2 weeks for suture removal. 6 weeks until healing of the herniation followed by 6 weeks of reconditioning and strengthening of the core musculature. Also discussed the need to employ the concepts of disc pressure management and core motion following the surgery to minimize the risk of recurrent disc herniation. We will obtain preoperative clearance i if necessary and proceed accordingly.  Patient has had a history of MRSA. Also he has cystic fibrosis of the lung. He periodically has infections. He is colonized organism was apparently Pseudomonas. Patient is a non-smoker. No history of DVT. We will obtain preoperative clearance from his pulmonologist. His blood glucose levels are well controlled. He is on Xarelto and would have to be off that at least 2 days before his surgery and for a week following that.  Continue with his current analgesics he is on hydrocodone. Preoperative decolonization   Dorothy Spark, PA-C 02/09/2021, 10:06 AM

## 2021-02-09 NOTE — Transfer of Care (Signed)
Immediate Anesthesia Transfer of Care Note  Patient: ARLEN DUPUIS  Procedure(s) Performed: Microdiscectomy, decompression Lumbar five-Sacral one Right (Right)  Patient Location: PACU  Anesthesia Type:General  Level of Consciousness: awake, alert  and oriented  Airway & Oxygen Therapy: Patient Spontanous Breathing  Post-op Assessment: Report given to RN, Post -op Vital signs reviewed and stable and Patient moving all extremities  Post vital signs: Reviewed and stable  Last Vitals:  Vitals Value Taken Time  BP 140/90 02/09/21 1257  Temp    Pulse 90 02/09/21 1259  Resp 13 02/09/21 1259  SpO2 96 % 02/09/21 1259  Vitals shown include unvalidated device data.  Last Pain:  Vitals:   02/09/21 0832  TempSrc:   PainSc: 8          Complications: No notable events documented.

## 2021-02-09 NOTE — Progress Notes (Signed)
Patient received per MD verbal order Albuterol nebulizer x 1

## 2021-02-10 ENCOUNTER — Encounter (HOSPITAL_COMMUNITY): Payer: Self-pay | Admitting: Specialist

## 2021-02-10 DIAGNOSIS — M5117 Intervertebral disc disorders with radiculopathy, lumbosacral region: Secondary | ICD-10-CM | POA: Diagnosis not present

## 2021-02-10 LAB — VITAMIN E: VITAMIN E LEVEL: 16 mg/L

## 2021-02-10 LAB — VITAMIN A: VITAMIN A RESULT: 58.7 ug/dL

## 2021-02-10 LAB — BASIC METABOLIC PANEL
Anion gap: 9 (ref 5–15)
BUN: 12 mg/dL (ref 6–20)
CO2: 26 mmol/L (ref 22–32)
Calcium: 8.8 mg/dL — ABNORMAL LOW (ref 8.9–10.3)
Chloride: 99 mmol/L (ref 98–111)
Creatinine, Ser: 0.69 mg/dL (ref 0.61–1.24)
GFR, Estimated: >60 mL/min (ref >60)
Glucose, Bld: 271 mg/dL — ABNORMAL HIGH (ref 70–99)
Potassium: 3.9 mmol/L (ref 3.5–5.1)
Sodium: 134 mmol/L — ABNORMAL LOW (ref 135–145)

## 2021-02-10 LAB — GLUCOSE, CAPILLARY
Glucose-Capillary: 257 mg/dL — ABNORMAL HIGH (ref 70–99)
Glucose-Capillary: 259 mg/dL — ABNORMAL HIGH (ref 70–99)
Glucose-Capillary: 246 mg/dL — ABNORMAL HIGH (ref 70–99)

## 2021-02-10 NOTE — Unmapped (Signed)
Nutrition - Brief: Labs    [name]'s fat-soluble vitamin levels were drawn in clinic on 6/27; results documented below. All are within normal limits with the exception of PT, which was not drawn. Given elevated PT on past lab draws, suggest check of Des-Gamma CarboxyPT (DCP) as more accurate indicator of Vit K status.    Suggest continuation of current vitamin regimen - MVW complete formulation gel cap 2 daily. Patient informed of results via MyChart.    LABS   VITAMIN LEVELS / INR:  Lab Results   Component Value Date    VITAMINA 58.7 02/06/2021    VITAMINA 42.4 01/18/2020    VITAMINA 44.4 11/19/2016     Lab Results   Component Value Date    CRP <4.0 11/24/2020    CRP <4.0 06/28/2020     Lab Results   Component Value Date    VITDTOTAL 38.2 02/06/2021    VITDTOTAL 27.9 03/19/2017     Lab Results   Component Value Date    VITAME 16.0 02/06/2021     Lab Results   Component Value Date    PT 21.2 (H) 12/01/2020    PT 16.9 (H) 11/26/2020    PT 20.7 (H) 06/28/2020     Lab Results   Component Value Date    DESGCARBPT 0.2 03/20/2017     No results found for: PIVKAII    Jackqulyn Livings MPH, RD, LDN  Pager: [phone]

## 2021-02-10 NOTE — Evaluation (Addendum)
Physical Therapy Evaluation Patient Details Name: James Proctor MRN: [id number] DOB: 1986-02-25 Today's Date: 02/10/2021   History of Present Illness  The pt is a 35 yo male presenting s/p microdiscectomy and decompression of L5-S1 on 6/30 due to ongoing low back and R leg pain. The patient is 7 weeks s/p ESI right L5-S1. PMH includes: cystic fibrosis, DM II, recurrent DVT, HTN, and pancreatic insufficiency due to cystic fibrosis.   Clinical Impression  Pt in bed upon arrival of PT, agreeable to evaluation at this time. Prior to admission the pt was completely independent with mobility and ADLs, but limited due to pain. The pt now presents with minor limitations in functional mobility, strength, and dynamic stability due to above dx, but is safe to return home with family support when medically cleared. The pt was able to complete all bed mobility with minG and verbal cues for log roll, but then completed sit-stand transfers, hallway ambulation, and stairs without physical assist or UE support. The pt has no overt LOB, demos good safety awareness, and was educated on progressive mobility program with pt verbalizing understanding. No further acute PT needs identified at this time, thank you for the consult.      Follow Up Recommendations Outpatient PT;Supervision for mobility/OOB (OPPT when cleared by MD for core strengthening and improved body mechanics to allow for full return to activty)    Equipment Recommendations  3in1 (PT)    Recommendations for Other Services       Precautions / Restrictions Precautions Precautions: Back Precaution Booklet Issued: Yes (comment) Precaution Comments: verbally reviewed, pt able to recall 3/3 Required Braces or Orthoses:  (no brace needed) Restrictions Weight Bearing Restrictions: No      Mobility  Bed Mobility Overal bed mobility: Needs Assistance Bed Mobility: Rolling;Sidelying to Sit Rolling: Min guard Sidelying to sit: Min guard        General bed mobility comments: VC for log roll, no physical assist    Transfers Overall transfer level: Needs assistance Equipment used: None Transfers: Sit to/from Stand Sit to Stand: Min guard         General transfer comment: minG for safety, cues for hand position, slowed control lower  Ambulation/Gait Ambulation/Gait assistance: Min guard;Supervision Gait Distance (Feet): 150 Feet Assistive device: None Gait Pattern/deviations: Step-through pattern;Decreased stride length;Wide base of support Gait velocity: 0.44 m/s Gait velocity interpretation: 1.31 - 2.62 ft/sec, indicative of limited community ambulator General Gait Details: slow but steady, no overt LOB but x3 staggering steps. pt able to correct without assist.  Stairs Stairs: Yes Stairs assistance: Min guard Stair Management: One rail Right;No rails;Step to pattern;Forwards Number of Stairs: 4 General stair comments: x2 with BUE support on rails, then x2 with no UE support. pt educated in step-to patten with LLE leading up and RLE leading down  Wheelchair Mobility    Modified Rankin (Stroke Patients Only)       Balance Overall balance assessment: Mild deficits observed, not formally tested                                           Pertinent Vitals/Pain Pain Assessment: 0-10 Pain Score: 9  Pain Location: surgical site Pain Descriptors / Indicators: Discomfort;Aching;Operative site guarding Pain Intervention(s): Monitored during session;Repositioned    Home Living Family/patient expects to be discharged to:: Private residence Living Arrangements: Spouse/significant other Available Help at Discharge: Available PRN/intermittently;Family  Type of Home: House Home Access: Stairs to enter Entrance Stairs-Rails: None Entrance Stairs-Number of Steps: 3 Home Layout: One level Home Equipment: Crutches      Prior Function Level of Independence: Independent         Comments:  limited by pain, but independent without AD     Hand Dominance   Dominant Hand: Right    Extremity/Trunk Assessment   Upper Extremity Assessment Upper Extremity Assessment: Overall WFL for tasks assessed    Lower Extremity Assessment Lower Extremity Assessment: Overall WFL for tasks assessed (pt denies difference in sensation, no coordination differences)    Cervical / Trunk Assessment Cervical / Trunk Assessment: Other exceptions Cervical / Trunk Exceptions: s/p spine surgery  Communication   Communication: No difficulties  Cognition Arousal/Alertness: Awake/alert Behavior During Therapy: WFL for tasks assessed/performed Overall Cognitive Status: Within Functional Limits for tasks assessed                                        General Comments General comments (skin integrity, edema, etc.): VSS, significant other present and agreeable to all education    Exercises     Assessment/Plan    PT Assessment Patent does not need any further PT services  PT Problem List         PT Treatment Interventions      PT Goals (Current goals can be found in the Care Plan section)  Acute Rehab PT Goals Patient Stated Goal: return to playing with his nephew PT Goal Formulation: With patient Time For Goal Achievement: 02/17/21 Potential to Achieve Goals: Good     AM-PAC PT "6 Clicks" Mobility  Outcome Measure Help needed turning from your back to your side while in a flat bed without using bedrails?: A Little Help needed moving from lying on your back to sitting on the side of a flat bed without using bedrails?: A Little Help needed moving to and from a bed to a chair (including a wheelchair)?: A Little Help needed standing up from a chair using your arms (e.g., wheelchair or bedside chair)?: A Little Help needed to walk in hospital room?: A Little Help needed climbing 3-5 steps with a railing? : A Little 6 Click Score: 18    End of Session Equipment  Utilized During Treatment: Gait belt Activity Tolerance: Patient tolerated treatment well;Patient limited by pain Patient left: in chair;with call bell/phone within reach;with family/visitor present Nurse Communication: Mobility status PT Visit Diagnosis: Pain    Time: 3710-6269 PT Time Calculation (min) (ACUTE ONLY): 27 min   Charges:   PT Evaluation $PT Eval Low Complexity: 1 Low PT Treatments $Gait Training: 8-22 mins        Rolm Baptise, PT, DPT   Acute Rehabilitation Department Pager #: [phone]  Gaetana Michaelis 02/10/2021, 10:13 AM

## 2021-02-10 NOTE — Evaluation (Signed)
Occupational Therapy Evaluation Patient Details Name: James Proctor MRN: [id number] DOB: 06-22-1986 Today's Date: 02/10/2021    History of Present Illness The pt is a 35 yo male presenting s/p microdiscectomy and decompression of L5-S1 on 6/30 due to ongoing low back and R leg pain. The patient is 7 weeks s/p ESI right L5-S1. PMH includes: cystic fibrosis, DM II, recurrent DVT, HTN, and pancreatic insufficiency due to cystic fibrosis.   Clinical Impression   Pt presents with decline in function and safety with LB ADLs. Pt requires min A with LB selfcare and min guard A for mobility  with no AD. Pt will have 24/7 assist at home. Pt and his wife educated on compensatory techniques, A/E with video demo and handout provided. All education completed and no further acute OT services are indicated at this time. Pt to d/c home today    Follow Up Recommendations  No OT follow up;Supervision - Intermittent    Equipment Recommendations  Tub/shower bench;Other (comment);3 in 1 bedside commode (reacher, sock aid, LH bath sponge)    Recommendations for Other Services       Precautions / Restrictions Precautions Precautions: Back Precaution Comments: verbally reviewed, pt able to recall 3/3 Required Braces or Orthoses:  (no brace required per MD note) Restrictions Weight Bearing Restrictions: No      Mobility Bed Mobility               General bed mobility comments: pt standing up at bathroom door upon arrival, pt's wife lying in bed    Transfers Overall transfer level: Needs assistance Equipment used: None Transfers: Sit to/from Stand Sit to Stand: Min guard         General transfer comment: minG for safety, cues for hand position, slowed control lower    Balance Overall balance assessment: Mild deficits observed, not formally tested                                         ADL either performed or assessed with clinical judgement   ADL Overall ADL's  : Needs assistance/impaired                                       General ADL Comments: min A with LB ADLs. Pt and his wife educated on compensatory techniques, A/E with video demo and handout provided     Vision Baseline Vision/History: Wears glasses Patient Visual Report: No change from baseline       Perception     Praxis      Pertinent Vitals/Pain Pain Assessment: 0-10 Pain Score: 8  Pain Location: surgical site Pain Descriptors / Indicators: Discomfort;Aching;Operative site guarding Pain Intervention(s): Monitored during session;Repositioned     Hand Dominance Right   Extremity/Trunk Assessment Upper Extremity Assessment Upper Extremity Assessment: Overall WFL for tasks assessed   Lower Extremity Assessment Lower Extremity Assessment: Defer to PT evaluation   Cervical / Trunk Assessment Cervical / Trunk Assessment: Other exceptions Cervical / Trunk Exceptions: s/p spine surgery   Communication Communication Communication: No difficulties   Cognition Arousal/Alertness: Awake/alert Behavior During Therapy: WFL for tasks assessed/performed Overall Cognitive Status: Within Functional Limits for tasks assessed  General Comments       Exercises     Shoulder Instructions      Home Living Family/patient expects to be discharged to:: Private residence Living Arrangements: Spouse/significant other Available Help at Discharge: Available PRN/intermittently;Family Type of Home: House Home Access: Stairs to enter Secretary/administrator of Steps: 3 Entrance Stairs-Rails: None Home Layout: One level     Bathroom Shower/Tub: Chief Strategy Officer: Standard     Home Equipment: Crutches          Prior Functioning/Environment Level of Independence: Independent        Comments: limited by pain, but independent without AD        OT Problem List: Decreased knowledge of use  of DME or AE;Decreased activity tolerance      OT Treatment/Interventions:      OT Goals(Current goals can be found in the care plan section) Acute Rehab OT Goals Patient Stated Goal: return to playing with his nephew OT Goal Formulation: With patient/family  OT Frequency:     Barriers to D/C:            Co-evaluation              AM-PAC OT "6 Clicks" Daily Activity     Outcome Measure Help from another person eating meals?: None Help from another person taking care of personal grooming?: None Help from another person toileting, which includes using toliet, bedpan, or urinal?: A Little Help from another person bathing (including washing, rinsing, drying)?: A Little Help from another person to put on and taking off regular upper body clothing?: None Help from another person to put on and taking off regular lower body clothing?: A Little 6 Click Score: 21   End of Session    Activity Tolerance: Patient tolerated treatment well Patient left: in chair  OT Visit Diagnosis: Pain Pain - part of body:  (back)                Time: 9381-8299 OT Time Calculation (min): 26 min Charges:  OT General Charges $OT Visit: 1 Visit OT Evaluation $OT Eval Low Complexity: 1 Low OT Treatments $Self Care/Home Management : 8-22 mins    Galen Manila 02/10/2021, 2:50 PM

## 2021-02-10 NOTE — Progress Notes (Signed)
Subjective: 1 Day Post-Op Procedure(s) (LRB): Microdiscectomy, decompression Lumbar five-Sacral one Right (Right) Patient reports pain as mild and moderate.    Objective: Vital signs in last 24 hours: Temp:  [97 F (36.1 C)-98.5 F (36.9 C)] 97.6 F (36.4 C) (07/01 0747) Pulse Rate:  [74-95] 74 (07/01 0747) Resp:  [12-24] 14 (07/01 0747) BP: (104-140)/(67-96) 104/74 (07/01 0747) SpO2:  [94 %-98 %] 94 % (07/01 0747) Weight:  [103.4 kg] 103.4 kg (06/30 0817)  Intake/Output from previous day: 06/30 0701 - 07/01 0700 In: 1404.6 [P.O.:360; I.V.:1044.6] Out: 2800 [Urine:2750; Blood:50] Intake/Output this shift: No intake/output data recorded.  No results for input(s): HGB in the last 72 hours. No results for input(s): WBC, RBC, HCT, PLT in the last 72 hours. Recent Labs    02/10/21 0515  NA 134*  K 3.9  CL 99  CO2 26  BUN 12  CREATININE 0.69  GLUCOSE 271*  CALCIUM 8.8*   No results for input(s): LABPT, INR in the last 72 hours.  Neurologically intact ABD soft Neurovascular intact Sensation intact distally Intact pulses distally Dorsiflexion/Plantar flexion intact Incision: dressing C/D/I and no drainage No cellulitis present Compartment soft No sign of DVT   Assessment/Plan: 1 Day Post-Op Procedure(s) (LRB): Microdiscectomy, decompression Lumbar five-Sacral one Right (Right) Advance diet Up with therapy D/C IV fluids Plan D/C today Discussed dressing instructions, D/C instructions, Lspine precautions Restart Xarelto tomorrow per Dr Shelle Iron   Lockie Pares Doralee Albino 02/10/2021, 8:08 AM

## 2021-02-10 NOTE — Progress Notes (Signed)
Discharge instructions (including medications) discussed with and copy provided to patient/caregiver 

## 2021-02-10 NOTE — Plan of Care (Signed)
  Problem: Education: Goal: Knowledge of General Education information will improve Description: Including pain rating scale, medication(s)/side effects and non-pharmacologic comfort measures Outcome: Adequate for Discharge   

## 2021-02-10 NOTE — Anesthesia Postprocedure Evaluation (Signed)
Anesthesia Post Note  Patient: James Proctor  Procedure(s) Performed: Microdiscectomy, decompression Lumbar five-Sacral one Right (Right)     Patient location during evaluation: PACU Anesthesia Type: General Level of consciousness: awake and alert Pain management: pain level controlled Vital Signs Assessment: post-procedure vital signs reviewed and stable Respiratory status: spontaneous breathing, nonlabored ventilation, respiratory function stable and patient connected to nasal cannula oxygen Cardiovascular status: blood pressure returned to baseline and stable Postop Assessment: no apparent nausea or vomiting Anesthetic complications: no   No notable events documented.  Last Vitals:  Vitals:   02/10/21 0747 02/10/21 0924  BP: 104/74   Pulse: 74 77  Resp: 14 17  Temp: 36.4 C   SpO2: 94% 95%    Last Pain:  Vitals:   02/10/21 0747  TempSrc: Oral  PainSc:                  Kennieth Rad

## 2021-02-11 LAB — HEMOGLOBIN A1C
Hgb A1c MFr Bld: 11.6 % — ABNORMAL HIGH (ref 4.8–5.6)
Mean Plasma Glucose: 286 mg/dL

## 2021-02-16 ENCOUNTER — Institutional Professional Consult (permissible substitution): Admit: 2021-02-16 | Discharge: 2021-02-17 | Payer: PRIVATE HEALTH INSURANCE

## 2021-02-16 NOTE — Unmapped (Signed)
Adult Cystic Fibrosis Telehealth Pharmacist Visit     Christian Young is a 35 y.o. male with cystic fibrosis (genotype: [phone]) being seen for annual medication assessment. Pertinent CF related problems include pancreatic insufficiency, CFRD, CF sinus disease, anxiety and depression. Last CF provider visit   With Durenda Hurt Rupcich PA-C on 02/06/21. At that time the plan was to hold off on tobramycin since he had not used it in some time and had reported tinnitus and hearing loss. Was referred to ENT and audiology at that time. Durenda Hurt attempted to review patient's medications but he could not recall what he was taking and asked that this author contact the patient to review his medications.     Today patient is joined by his wife [name] who manages his medications. Pt had back surgery about a week ago and is recovering well. Denies any issues from a pulmonary standpoint.     OUTPATIENT CF RELATED MEDICATION REVIEW     Medications  Adherence/  Comments Labs as of 02/16/21   Assessment   Airway Clearance ??? albuterol HFA Q6H PRN   ??? Albuterol nebs twice daily  ??? pulmozyme daily  ??? HTS 7% twice daily   Not using albuterol HFA unless congested or during allergy season Last FEV1:   12/01/20 - 72.6%  02/06/21 - 71.9%   ??? Mild obstruction (FEV1 70-89%). PFTs today have declined.   Chronic Respiratory/  Sinus/  Allergy ??? Symbicort 160-4.60mcg: 2 puffs BID   ??? Cetirizine 10 mg once daily   ??? Flonase 1 spray each nostril daily   ??? Singular 10mg  daily    Flonase prn when congested or during allergy season      CFTR Modulator ??? Trikafta: 2 tablets (ELE/TEZ/IVA) QAM and 1 tablet (IVA) QPM   Taking as prescribed with fatty food. Usually cereal with whole milk and with dinner  Latest LFTs  Lab Results   Component Value Date    AST 21 12/09/2020    ALT 30 12/09/2020    ALKPHOS 71 12/09/2020    BILITOT 0.6 12/09/2020    BILIDIR 0.20 07/04/2020    On since Feb 2021, current schedule of LFT monitoring: annual  ?? Last LFTs checked:   o AST WNL. ALT WNL  o Tbili WNL   Inhaled Antibiotics ??? Inhaled tobramycin ([place]) BID      Not taking as prescribed. Holding this for now. Wife explains that they are getting cultures and then deciding if he still needs it  Sputum culture/AFB  CF Sputum Culture   Date Value Ref Range Status   02/06/2021 3+ Mucoid Pseudomonas aeruginosa (A)  Final   02/06/2021 2+ Smooth Pseudomonas aeruginosa (A)  Final   02/06/2021 3+ Oropharyngeal Flora Isolated  Final   07/01/2020 <1+ Smooth Pseudomonas aeruginosa (A)  Final   07/01/2020 <1+ Mucoid Pseudomonas aeruginosa (A)  Final   07/01/2020 1+ Oropharyngeal Flora Isolated  Final     AFB Culture   Date Value Ref Range Status   07/01/2020 No Acid Fast Bacilli Detected  Final   09/29/2019 No Acid Fast Bacilli Detected  Final   03/25/2019 No Acid Fast Bacilli Detected  Final    Exacerbations: Not frequent.   ??? Last IV antibiotics: April 2022 zosyn and tobramycin, Nov 2021 zosyn  ??? Last oral antibiotics: Oct 2021 cipro and doxy   Chronic ABX/  suppressive therapy   ??? N/A N/A     Pancreatic Enzyme ??? Creon 36,000 units  Meals: 8 caps         Snacks: 4 caps Taking as prescribed. Averages 2-3 meals/day and 0-1 snacks/day.    ??? No reports of malabsorption.   ??? Based on reported number of meals/snacks per day, within max 15,000 units lipase/kg/day.    Vitamins ??? MVW Complete Softgels 1 capsule(s) BID        Taking 2 caps BID. May miss up to 3 evening doses when his wife is at work    Last vitamin levels  Lab Results   Component Value Date    VITAMINA 58.7 02/06/2021    VITDTOTAL 38.2 02/06/2021    VITAME 16.0 02/06/2021    PT 21.2 (H) 12/01/2020    INR 1.82 12/01/2020    Vitamin access: [facility]  Last vitamin Levels:   ?? Vitamin A WNL. Vitamin D WNL. Vitamin E WNL. PT/INR abnormal, high.  ??? Up to date   ??? Discussed with CF dietician, will go back to 2 caps daily   Other GI ??? MVW probiotic daily   ??? Omeprazole 20mg  once daily    Taking as prescribed.   Probiotic can be a challenge since it is not in the blister pack   ??? Reflux symptoms controlled.    Endocrine ??? Novolog up to 150 units per day divided into 3 meals   ??? Lantus 70 units daily  ??? N/A   Managed by endo. Taking Novolog 30 units with meals + sliding scale and  lantus 50 units once daily. No longer having issues with hypoglycemia  LAST A1C:   Lab Results   Component Value Date    A1C 10.7 (H) 11/29/2020    A1C 9.4 (H) 01/18/2020    A1C 8.8 (H) 09/29/2019       Last DEXA: T-Scores  Image Area 03/05/17   Spine 0.1   Femur -0.1   Femoral Neck -0.6    ??? Managed by endo. A1c not at goal. Endo appt later this month   ??? Last DEXA done 03/05/17 - WNL.     Neuro/  Psych ??? Amitriptyline 100mg  nightly  ??? Fluoxetine 60mg  once daily   ??? Gabapentin 600mg  TID  ??? Lamotrigine 200mg  daily   ??? Pramipexole 0.25mg  nightly  ??? Trazodone 150mg  nightly  Taking as prescribed. Now taking lamotrigine BID - mood has been better   Last EKG  11/24/20: ??? Mood has improved with higher dose of lamotrigine        Patient reported:  ??? Side Effects reported: No   ??? Insurance changes: No       MEDICATION MANAGEMENT   ??? Adherence: Excellent - challenges with MVW vitamin and probiotic as they are not in the blister pack   ??? Access: No issues related to access   ??? Prescription Renewals: None  ??? Pharmacies used for CF medications:   Specialty Pharmacy:  ?? [facility])  ?? [facility] (Pulmozyme)  Local Pharmacy: [facility] in [place] (non-specialty meds, Creon, HTS 7%)      FOLLOW UP LAB(S)   ??? Hemoglobin A1c (due now - can get locally or at next in-person visit)- endo appt at the end of the month     CF RELATED MEDICATION PLAN     MEDICATION CHANGES AFTER VISIT:  ??? Take MVW Complete 2 caps daily     I spent a total of 20 minutes on the phone with the patient delivering clinical care and providing education/counseling. Medications  reviewed in EPIC medication station and updated today by the clinical pharmacist practitioner.     Recommendations and medication-related problems were discussed directly with pulmonologist, Durenda Hurt Rupcich, PA-C.    Electronically signed:  Alben Spittle, PharmD, BCACP, CPP  Clinical Pharmacist Practitioner  [facility] Adult Cystic Fibrosis/Pulmonary Clinic  [phone]      I am located on-site and the patient is located on-site for this visit.

## 2021-02-17 NOTE — Unmapped (Signed)
Informed by pharmacist that [name] increased his MVW gel caps to 4x daily from prescribed 2. Suspect this will be excessive as levels have been maintained WNL on 2 gel caps. Recommended he decrease back to 2 gel caps; patient confirmed plan via MyChart.    Jackqulyn Livings MPH, RD, LDN  Pager: [phone]

## 2021-02-20 NOTE — Unmapped (Signed)
I have reviewed the note by the Pharm D and agree with the assessment and plan.    Ellwyn Ergle H. Lurena Nida, MD  Professor of Medicine

## 2021-02-23 DIAGNOSIS — J3089 Other allergic rhinitis: Principal | ICD-10-CM

## 2021-02-23 DIAGNOSIS — K8689 Other specified diseases of pancreas: Principal | ICD-10-CM

## 2021-02-23 MED ORDER — LIPASE-PROTEASE-AMYLASE 36,000-114,000-180,000 UNIT CAPSULE,DELAY REL
ORAL_CAPSULE | 11 refills | 0 days | Status: CP
Start: 2021-02-23 — End: ?

## 2021-02-23 MED ORDER — CETIRIZINE 10 MG TABLET
ORAL_TABLET | Freq: Every day | ORAL | 11 refills | 30 days | Status: CP
Start: 2021-02-23 — End: 2022-02-23

## 2021-03-03 NOTE — Unmapped (Addendum)
It was a pleasure seeing you today at our [facility] Endocrinology clinic.   Blood sugars still a little high over night might try more Basaglar 55 or 60 unis  Coverage for dinner w Novolog and prebolus  Urine albumin test today  CDE for pump  Dexcom to Prevo        t

## 2021-03-03 NOTE — Unmapped (Unsigned)
Chief Complaint:     35 y.o. male with a PMH of CF, HTN w CFRD.   No chief complaint on file.    Subjective:     HPI   CFRD dx'd ~2/19.     Last seen by myself 04/23/2019 (video visit d/t pandemic)  At last visit:   ??? Blood sugars very stable overnight, some spikes post meal  ??? Intensify the high blood sugar alarm currently at 390 reduce it to 250-290 range   ??? If spikes post meal increase base Novolog from 30-40 to 35-45 and take 10-20 min before eating  ??? See you in 3 mo, A1c is ordered for you  ??? Eye exam as schedule in Sept :)    Since last visit:   Wants Omnipod, using Libre  Recent surgery herniated disk, had to quit job  Wife accompanies and works EMS and they have done research on that one and [employer] does diabetes program  Weight loss due to dietary changes  Stopped drinking soda and mainly water/SF juice  Had physical job Radiographer, therapeutic house    Currently taking the following for CFRD:   Hospital doctor Lantus insulin 50 units qhs  Aspart insulin ~30 units ac, + 2:50>150 (taking less than before d/t weight loss)    Denies symptoms consistent with peripheral or autonomic neuropathy  Denies recent lows requiring assistance  Denies recent nocturnal lows  DFE scheduled setup [place] locally     Continuous glucose monitor (CGM) was computer downloaded, I personally reviewed greater than 72 hours of data and interpreted, and discussed the findings with the patient.   Evening excursions        __________________________________________________  ________________________      Initial HPI carried forward.   Patient is a 35 year old male with a PMH of CF, HTN and GAD/MDD who presents for a recurrence of CFRD.     The patient was initially treated for cystic fibrosis related diabetes (CFRD) when he received his care at [facility] with 5 units of levemir at night. Subsequently, after moving to [place], he was taken off of his diabetes medications which was approximately 3 years ago. Since then he has continued to check his blood sugar approximately three times per day. Recently, his blood sugars have been typically running 200's - 300's. He was scheduled for endocrinology assessment for CFRD and prior to today's appointment, his hemoglobin A1c was found to be 7.6%. Upon arrival to the clinic today, his POCT glucose was 306 mg/dL.     He denies any regular hypglycemic episodes. If he does not eat much, he reports that he will occasionally dip lower than usual (approxiamtely once per week his blood glucose will run <100 mg/dL and approximately once per month his blood glucose will be <60 mg/dL). He states that recently, however, the lowest he has seen is 160 mg/dL. He endorses occasional blurriness in his vision and denies any neuropathic symptoms in his feet. He requires a new meter. His cystic fibrosis affects his lungs, sinuses and GI tract; he endorses pancreatic involvement. He does not appear malnourished. Most recently he was hospitalized in January 2019 for a CF-bronchiectasis exacerbation. He had a RUL lobectomy in August 2018. For his HTN, he takes lisinopril and carvedilol and he takes lamotrigine for his GAD and MDD.    The past medical history, surgical history, family history, social history, medications and allergies were reviewed in Epic.    Past Medical History:   Diagnosis Date   ???  Anxiety    ??? Chronic pain disorder    ??? Cystic fibrosis (CMS-HCC)    ??? Depression    ??? Hypertension    ??? Lumbar radiculopathy 10/26/2020   ??? Nonproductive cough 04/05/2018     Past Surgical History:   Procedure Laterality Date   ??? IR INSERT PORT AGE GREATER THAN 5 YRS  03/27/2019    IR INSERT PORT AGE GREATER THAN 5 YRS 03/27/2019 Rush Barer, MD IMG [facility]   ??? PR REMOVAL OF LUNG,LOBECTOMY Right 03/29/2017    Procedure: REMOVAL OF LUNG, OTHER THAN PNEUMONECTOMY; SINGLE LOBE (LOBECTOMY);  Surgeon: Cherie Dark, MD;  Location: MAIN [facility];  Service: Thoracic     Current Outpatient Medications on File Prior to Visit Medication Sig Dispense Refill   ??? albuterol (PROVENTIL HFA;VENTOLIN HFA) 90 mcg/actuation inhaler Inhale 2 puffs every six (6) hours as needed. 1 Inhaler 1   ??? albuterol 2.5 mg /3 mL (0.083 %) nebulizer solution Inhale 3 mL (2.5 mg total) by nebulization two (2) times a day. 540 mL 3   ??? amitriptyline (ELAVIL) 100 MG tablet TAKE ONE TABLET BY MOUTH AT BEDTIME (Patient taking differently: Take 100 mg by mouth nightly.) 30 tablet 1   ??? atorvastatin (LIPITOR) 20 MG tablet Take 20 mg by mouth nightly.     ??? BETHKIS 300 mg/4 mL Nebu Inhale 1 vial via nebulization every 12 hours for 28 days, alternating every other 28 days 224 mL 6   ??? blood sugar diagnostic (GLUCOSE BLOOD) Strp by Other route Three (3) times a day with a meal. Rx sent to [facility] 01/04/20     ??? blood-glucose meter kit Dispense meter that is preferred by patient's insurance company 1 each 0   ??? budesonide-formoteroL (SYMBICORT) 160-4.5 mcg/actuation inhaler Inhale 2 puffs Two (2) times a day. 30.6 g 3   ??? cetirizine (ZYRTEC) 10 MG tablet Take 1 tablet (10 mg total) by mouth in the morning. 30 tablet 11   ??? docusate sodium (COLACE) 100 MG capsule Take 100 mg by mouth in the morning. As needed while on pain meds s/p back surgery.     ??? dornase alfa (PULMOZYME) 1 mg/mL nebulizer solution Inhale 2.5 mg daily. 450 mL 3   ??? EASY TOUCH LANCING DEVICE Misc Use as directed.     ??? EASY TOUCH TWIST LANCETS 30 gauge Misc Use as directed.     ??? elexacaftor-tezacaftor-ivacaft (TRIKAFTA) 100-50-75 mg(d) /150 mg (n) tablet Take 2 Tablets (orange) by mouth in the morning and 1 tablet (blue) in the evening with fatty food 84 tablet 6   ??? FLUoxetine 60 mg Tab Take 60 mg by mouth daily.      ??? fluticasone propionate (FLONASE) 50 mcg/actuation nasal spray 1 spray into each nostril daily.     ??? FREESTYLE LIBRE 14 DAY SENSOR kit Use 3-4 times daily     ??? gabapentin (NEURONTIN) 600 MG tablet Take 600 mg by mouth Three (3) times a day.     ??? insulin ASPART (NOVOLOG FLEXPEN) 100 unit/mL (3 mL) injection pen Use up to 150 units/day, divided 3 TIMES DAILY BEFORE MEALS (Patient taking differently: Inject 30 Units under the skin Three (3) times a day before meals. Plus sliding scale) 45 mL 0   ??? insulin glargine (BASAGLAR, LANTUS) 100 unit/mL (3 mL) injection pen Inject 0.7 mL (70 Units total) under the skin daily. (Patient taking differently: Inject 50 Units under the skin nightly.) 30 mL 0   ???  lamoTRIgine (LAMICTAL) 200 MG tablet Take 200 mg by mouth Two (2) times a day.     ??? lipase-protease-amylase (CREON) 36,000-114,000- 180,000 unit CpDR Take 8 caps with meals TID and 4 caps with snacks up to three times a day. 1200 capsule 11   ??? lisinopriL (PRINIVIL,ZESTRIL) 10 MG tablet Take 1 tablet (10 mg total) by mouth daily. 30 tablet 0   ??? melatonin 10 mg Tab Take 10 mg by mouth nightly.     ??? methocarbamoL (ROBAXIN) 750 MG tablet Take 500 mg by mouth. Every 6-8 hours as needed     ??? montelukast (SINGULAIR) 10 mg tablet TAKE ONE TABLET BY MOUTH AT BEDTIME (Patient taking differently: Take 10 mg by mouth nightly.) 90 tablet 3   ??? MVW COMPLETE FORMUL PROBIOTIC 40 billion cell -15 mg CpDR Take 1 capsule by mouth daily. (Patient taking differently: Take 1 capsule by mouth daily.) 90 capsule 3   ??? MVW Complete, pediatric multivit 61-D3-vit K, (MVW COMPLETE FORMULATION) 1,500-800 unit-mcg cap Take 1 capsule by mouth Two (2) times a day. (Patient taking differently: Take 2 capsules by mouth Two (2) times a day.)     ??? nebulizers Misc Use as directed with inhaled medications 4 each 3   ??? omeprazole (PRILOSEC) 20 MG capsule Take 20 mg by mouth daily.     ??? oxyCODONE (OXY-IR) 5 mg capsule Take 5 mg by mouth every four (4) hours as needed for pain. 5-10mg  prn pain (s/p back surgery)     ??? pen needle, diabetic (BD ULTRA-FINE NANO PEN NEEDLE) 32 gauge x 5/32 (4 mm) Ndle use up to 4 times daily 400 each 12   ??? polyethylene glycol (MIRALAX) 17 gram packet Take 17 g by mouth in the morning. As needed while on pain meds s/p back surgery.     ??? pramipexole (MIRAPEX) 0.125 MG tablet Take 0.25 mg by mouth every evening.      ??? sodium chloride 7% 7 % Nebu Inhale 4 mL by nebulization Two (2) times a day. Increase to 4 times while taking antibiotics     ??? traZODone (DESYREL) 150 MG tablet 150 mg nightly.     ??? XARELTO 20 mg tablet Take 20 mg by mouth daily.       No current facility-administered medications on file prior to visit.     SOCIAL HISTORY:  Social History     Social History Narrative   ??? Not on file       FAMILY HISTORY:  Family History   Problem Relation Age of Onset   ??? Bipolar disorder Mother    ??? Depression Mother      ROS:  The balance of 10 systems was reviewed and unremarkable except as stated above.     Objective:   BP 121/88  - Pulse 86  - Ht 183 cm (6' 0.05)  - Wt (!) 109 kg (240 lb 3.2 oz)  - BMI 32.53 kg/m??     Alert and oriented with appropriate mood and affect, appears well and in no distress  NECK: Supple without carotid bruits, thyromegaly or lymphadenopathy.   HEART: Regular rate and rhythm without murmur, rub, gallop.  ABDOMEN: Soft and nontender without masses.   EXTREMITIES: Showed no edema, distal pulse intact, hygiene good  NEURO: Sensation intact on the soles of the feet to the 10g monofilament on bilateral barefoot examination      REVIEW OF RECENT PERTINENT LABORATORY AND IMAGING DATA:  Lab Results   Component  Value Date    A1C 10.0 (H) 03/09/2021    A1C 10.7 (H) 11/29/2020    A1C 9.4 (H) 01/18/2020    A1C 8.8 (H) 09/29/2019    A1C 8.5 (H) 08/12/2018    A1C 10.7 (H) 04/06/2018    A1C 9.0 (H) 01/07/2018    A1C 6.1 (H) 11/19/2016     Lab Results   Component Value Date    CREATININE 0.85 12/09/2020     Lab Results   Component Value Date    GFRNONAA 114 01/18/2020     Lab Results   Component Value Date    GFRAA 132 01/18/2020     Lab Results   Component Value Date    K 4.1 12/09/2020     Lab Results   Component Value Date    CHOL 148 12/05/2020     Lab Results   Component Value Date    LDL 96 12/05/2020     Lab Results   Component Value Date    HDL 36 (L) 12/05/2020     Lab Results   Component Value Date    TRIG 80 12/05/2020     Lab Results   Component Value Date    ALT 30 12/09/2020    ALT 31 01/18/2020     No results found for: LABCREA, ALBQTUR, MSHCGMOM, ALBCRERAT  No results found for: VITAMINB12  Lab Results   Component Value Date    TSH 2.120 11/18/2016     Lab Results   Component Value Date    WBC 5.7 12/09/2020    HGB 14.9 12/09/2020    HCT 44.5 12/09/2020    PLT 293 12/09/2020     Lab Results   Component Value Date    VITDTOTAL 38.2 02/06/2021     Lab Results   Component Value Date    CALCIUM 9.7 12/09/2020       Assessment/Plan:   1. Diabetes mellitus related to CF (cystic fibrosis) (CMS-HCC)  -Recent A1c far above goal though CGM data indicates recent improvements not yet captured, agree w exploring pump options, reviewed basic principles automated insulin delivery today which integrate w Dexcom, he would like to start w Dexcom and then determine pump choice upon d/w CDE and further research  -Advised inc'd or prebolus Novolog ac dinner, commended on recent improvements in BG data  Patient Instructions   It was a pleasure seeing you today at our [facility] Endocrinology clinic.   ??? Blood sugars still a little high over night might try more Basaglar 55 or 60 unis  ??? Coverage for dinner w Novolog and prebolus  ??? Urine albumin test today  ??? CDE for pump  ??? Dexcom to [facility]    2. Encounter for long-term (current) use of insulin (CMS-HCC)  As above    3. Obesity  Noted, commended on efforts    Follow up recommended in about 4 mo w CDE interim for pump discussion, he is aware to contact us or return sooner for interim concerns. 30-40 to 35-45 and take 10-20 min before eating  ??? See you in 3 mo, A1c is ordered for you  ??? Eye exam as schedule in Sept :)    2. Encounter for long-term (current) use of insulin (CMS-HCC)  As above    3. Obesity  Noted, encouraged dietary efforts, though need to balance against potential for malabsorption/caloric deficits related to CF    Follow up recommended in about 3 mo, he is aware to contact us or return  sooner for interim concerns.

## 2021-03-06 DIAGNOSIS — K219 Gastro-esophageal reflux disease without esophagitis: Principal | ICD-10-CM

## 2021-03-06 MED ORDER — OMEPRAZOLE 20 MG CAPSULE,DELAYED RELEASE
ORAL_CAPSULE | Freq: Every day | ORAL | 3 refills | 90 days | Status: CP
Start: 2021-03-06 — End: 2022-03-06

## 2021-03-09 ENCOUNTER — Ambulatory Visit
Admit: 2021-03-09 | Discharge: 2021-03-10 | Payer: PRIVATE HEALTH INSURANCE | Attending: "Endocrinology | Primary: "Endocrinology

## 2021-03-09 DIAGNOSIS — E089 Diabetes mellitus due to underlying condition without complications: Principal | ICD-10-CM

## 2021-03-09 DIAGNOSIS — Z794 Long term (current) use of insulin: Principal | ICD-10-CM

## 2021-03-09 DIAGNOSIS — E11649 Type 2 diabetes mellitus with hypoglycemia without coma: Principal | ICD-10-CM

## 2021-03-09 MED ORDER — DEXCOM G6 TRANSMITTER DEVICE
11 refills | 0.00000 days | Status: CP
Start: 2021-03-09 — End: ?

## 2021-03-09 MED ORDER — DEXCOM G6 SENSOR DEVICE
11 refills | 0 days | Status: CP
Start: 2021-03-09 — End: ?

## 2021-03-09 MED ORDER — INSULIN ASPART (U-100) 100 UNIT/ML (3 ML) SUBCUTANEOUS PEN
12 refills | 0.00000 days | Status: CP
Start: 2021-03-09 — End: ?

## 2021-03-09 MED ORDER — DEXCOM G6 RECEIVER MISC
0 refills | 0.00000 days | Status: CP
Start: 2021-03-09 — End: ?

## 2021-03-09 MED ORDER — PEN NEEDLE, DIABETIC 32 GAUGE X 5/32" (4 MM)
12 refills | 0.00000 days | Status: CP
Start: 2021-03-09 — End: ?

## 2021-03-09 MED ORDER — INSULIN GLARGINE (U-100) 100 UNIT/ML (3 ML) SUBCUTANEOUS PEN
12 refills | 0.00000 days | Status: CP
Start: 2021-03-09 — End: ?

## 2021-03-09 NOTE — Unmapped (Signed)
Libre CGM downloaded. Report scanned into chart. POC glucose and A1C done today. PP 7 am. 151 mg/dL.

## 2021-03-13 ENCOUNTER — Ambulatory Visit
Admit: 2021-03-13 | Discharge: 2021-03-22 | Disposition: A | Discharge status: Home / Self Care | Payer: PRIVATE HEALTH INSURANCE | Admitting: Internal Medicine

## 2021-03-13 NOTE — Unmapped (Addendum)
PULMONARY TREATMENT NOTE    Christian Young is a 35 year old man with history of CF ([id number] and [phone] insertion) on trikafta, CFRD on insulin, history of DVT (on xarelto currently), with recent baseline lung function of FEV1 ~72% calling due to worsening sinus and pulmonary symptoms suggestive of CF exacerbation    Last week started feeling fatigued, took some OTC meds - started as sinus symptoms with headache, ear pain, low energy - pretty typical for him for exacerbations to start this way - this AM, little energy, some mucus coming up with vest treatments, no blood in sputum, but is a dark green - this is different for him   Also somewhat unusual for him to have with CF exacerbation is fever - notes he has felt feverish, and his recorded temp yesterday was 103F  Appetite has been ok, staying hydrated  home covid test negative about 3 days ago (Rapid antigen test he thinks)  Home spirometry (which he reports is fairly reliable for him) was reduced to 65%  For airway clearance, does albuterol and HTS 7% twice a day, vest and aerobika when not feeling well, but when well just uses vest; also on daily pulmozyme, symbicort BID    Discussed recommendation to come in to ER for further evaluation given fever, symptoms, and with plan to initiate treatment for CF pulmonary exacerbation in addition to assessing for other causes of symptoms (specifically COVID). Typically grows pseudomonas (seen on most recent sputum culture from 02/06/21), OPF, and MSSA, more remotely has grown NTM but not recently (last +AFB was spring 2020, with MAC).   Patient in agreement with plan, CF nurse and MAO also alerted to plan for admission (either main campus or [facility] pending bed availability) by way of ER.     Recommendations for initial evaluation (in addition to other evaluations deemed appropriate by the examining provider) include:  - blood cultures given fever and presence of port  - initial labs to assess baseline renal function, etc: CMP, CBC diff, ESR, CRP, PT/INR,   - please obtain CF sputum culture and if able to produce, AFB Culture (history of growing NTM, not concerned for tuberculosis)  - please obtain full RPP as well as COVID testing - he has underlying lung disease and would warrant consideration of COVID-directed therapies if positive  - CXR PA/Lateral    Recommendations for initial treatment of CF exacerbation:  - start Zosyn 4.5 mg q6h and IV tobramycin per pharmacy dosing - during last admission was on IV tobramycin 280 mg BID - has a history of AIN on zosyn in the past so needs close renal monitoring;   - history of tinnitus with tobramycin in the past, please consult audiology at start of admission for evaluation of hearing, was in process of outpatient evaluation but had not yet been seen - if showing signs of hearing loss may need to consider alternative antibiotic therapies which may be challenging with list of allergies  - obtain PFTs early in exacerbation and at 7-10 day mark to guide duration of therapy  - increase airway clearance: Vest and aerobika at least TID, albuterol at least TID, does increase frequency of HTS when admitted so would also increase to TID; continue pulmozyme daily, and formulary substitution for home symbicort  - patient has been instructed to bring home supply of trikafta for use while in hospital  - if PT/INR normal on admission start 5 mg PO vitamin K twice a week while on IV antibiotics; if PT/INR  elevated on admission start 5 mg PO vitamin K daily and recheck in 5 days - if normalized switch to twice a week while on IV antibiotics, if still elevated continue daily while on IV antibiotics (may be influenced by home xarelto use)  - continue home enzymes and other multivitamins   - in addition to RT for airway clearance order, please also consider PT consult during admission for exercise as part of airway clearance  - continue home nasal spray, in process of referral to ENT as outpatient  - glucose monitoring while admitted, last A1c 10, low threshold to engage endocrine for CFRD if sugars difficult to control during acute exacerbation   - ensure regular BMs while admitted with scheduled daily miralax and hold parameters/uptitration as needed - history of possible SBO/ileus vs DIOS in past  - please consult pulmonary during admission for ongoing evaluation and recommendations during course of his care      Murlean Iba, MD  [facility] Pediatric Pulmonary Fellow  [facility] Adult Pulmonary and Critical Care Fellow  [phone]

## 2021-03-13 NOTE — Unmapped (Addendum)
Trikafta last filled on 4/26 - Christian Young reports no interruption in therapy as he had an excess of medication on hand. Continues to report doing well on Trikafta.     [facility] Clinical Assessment & Refill Coordination Note    Christian Young Young, Christian Young Young: [birth year]  Phone: [phone] (home)     All above HIPAA information was verified with patient.     Was a Nurse, learning disability used for this call? No    Specialty Medication(s):   CF/Pulmonary: -Trikafta     Current Outpatient Medications   Medication Sig Dispense Refill   ??? albuterol (PROVENTIL HFA;VENTOLIN HFA) 90 mcg/actuation inhaler Inhale 2 puffs every six (6) hours as needed. 1 Inhaler 1   ??? albuterol 2.5 mg /3 mL (0.083 %) nebulizer solution Inhale 3 mL (2.5 mg total) by nebulization two (2) times a day. 540 mL 3   ??? amitriptyline (ELAVIL) 100 MG tablet TAKE ONE TABLET BY MOUTH AT BEDTIME (Patient taking differently: Take 100 mg by mouth nightly.) 30 tablet 1   ??? atorvastatin (LIPITOR) 20 MG tablet Take 20 mg by mouth nightly.     ??? BETHKIS 300 mg/4 mL Nebu Inhale 1 vial via nebulization every 12 hours for 28 days, alternating every other 28 days 224 mL 6   ??? blood sugar diagnostic (GLUCOSE BLOOD) Strp by Other route Christian Young (3) times a day with a meal. Rx sent to [facility] 01/04/20     ??? blood-glucose meter kit Dispense meter that is preferred by patient's insurance company 1 each 0   ??? blood-glucose meter,continuous (DEXCOM G6 RECEIVER) Misc Use as directed 1 each 0   ??? blood-glucose sensor (DEXCOM G6 SENSOR) Devi Use sq as directed every 10 days 9 each 11   ??? blood-glucose transmitter (DEXCOM G6 TRANSMITTER) Devi Use as directed, replace every 90 days 1 each 11   ??? budesonide-formoteroL (SYMBICORT) 160-4.5 mcg/actuation inhaler Inhale 2 puffs Two (2) times a day. 30.6 g 3   ??? cetirizine (ZYRTEC) 10 MG tablet Take 1 tablet (10 mg total) by mouth in the morning. 30 tablet 11   ??? docusate sodium (COLACE) 100 MG capsule Take 100 mg by mouth in the morning. As needed while on pain meds s/p back surgery.     ??? dornase alfa (PULMOZYME) 1 mg/mL nebulizer solution Inhale 2.5 mg daily. 450 mL 3   ??? EASY TOUCH LANCING DEVICE Misc Use as directed.     ??? EASY TOUCH TWIST LANCETS 30 gauge Misc Use as directed.     ??? elexacaftor-tezacaftor-ivacaft (TRIKAFTA) 100-50-75 mg(d) /150 mg (n) tablet Take 2 Tablets (orange) by mouth in the morning and 1 tablet (blue) in the evening with fatty food 84 tablet 6   ??? FLUoxetine 60 mg Tab Take 60 mg by mouth daily.      ??? fluticasone propionate (FLONASE) 50 mcg/actuation nasal spray 1 spray into each nostril daily.     ??? FREESTYLE LIBRE 14 DAY SENSOR kit Use 3-4 times daily     ??? gabapentin (NEURONTIN) 600 MG tablet Take 600 mg by mouth Christian Young (3) times a day.     ??? insulin ASPART (NOVOLOG FLEXPEN) 100 unit/mL (3 mL) injection pen Use up to 150 units/day, divided 3 TIMES DAILY BEFORE MEALS (Patient taking differently: Inject 30 Units under the skin Christian Young (3) times a day before meals. Plus sliding scale) 45 mL 0   ??? insulin ASPART (NOVOLOG FLEXPEN) 100 unit/mL (3 mL) injection pen Use up to  50 units/day, divided TID AC meals as per MD instructions 45 mL 12   ??? insulin glargine (BASAGLAR, LANTUS) 100 unit/mL (3 mL) injection pen Inject 0.7 mL (70 Units total) under the skin daily. (Patient taking differently: Inject 50 Units under the skin nightly.) 30 mL 0   ??? insulin glargine (BASAGLAR, LANTUS) 100 unit/mL (3 mL) injection pen Use up to 100 units per day as per MD instructions 90 mL 12   ??? lamoTRIgine (LAMICTAL) 200 MG tablet Take 200 mg by mouth Two (2) times a day.     ??? lipase-protease-amylase (CREON) 36,000-114,000- 180,000 unit CpDR Take 8 caps with meals TID and 4 caps with snacks up to Christian Young times a day. 1200 capsule 11   ??? lisinopriL (PRINIVIL,ZESTRIL) 10 MG tablet Take 1 tablet (10 mg total) by mouth daily. 30 tablet 0   ??? melatonin 10 mg Tab Take 10 mg by mouth nightly.     ??? methocarbamoL (ROBAXIN) 750 MG tablet Take 500 mg by mouth. Every 6-8 hours as needed     ??? montelukast (SINGULAIR) 10 mg tablet TAKE ONE TABLET BY MOUTH AT BEDTIME (Patient taking differently: Take 10 mg by mouth nightly.) 90 tablet 3   ??? MVW COMPLETE FORMUL PROBIOTIC 40 billion cell -15 mg CpDR Take 1 capsule by mouth daily. (Patient taking differently: Take 1 capsule by mouth daily.) 90 capsule 3   ??? MVW Complete, pediatric multivit 61-D3-vit K, (MVW COMPLETE FORMULATION) 1,500-800 unit-mcg cap Take 1 capsule by mouth Two (2) times a day. (Patient taking differently: Take 2 capsules by mouth Two (2) times a day.)     ??? nebulizers Misc Use as directed with inhaled medications 4 each 3   ??? omeprazole (PRILOSEC) 20 MG capsule Take 1 capsule (20 mg total) by mouth in the morning. 90 capsule 3   ??? oxyCODONE (OXY-IR) 5 mg capsule Take 5 mg by mouth every four (4) hours as needed for pain. 5-10mg  prn pain (s/p back surgery)     ??? pen needle, diabetic (BD ULTRA-FINE NANO PEN NEEDLE) 32 gauge x 5/32 (4 mm) Ndle use up to 4 times daily 400 each 12   ??? polyethylene glycol (MIRALAX) 17 gram packet Take 17 g by mouth in the morning. As needed while on pain meds s/p back surgery.     ??? pramipexole (MIRAPEX) 0.125 MG tablet Take 0.25 mg by mouth every evening.      ??? sodium chloride 7% 7 % Nebu Inhale 4 mL by nebulization Two (2) times a day. Increase to 4 times while taking antibiotics     ??? traZODone (DESYREL) 150 MG tablet 150 mg nightly.     ??? XARELTO 20 mg tablet Take 20 mg by mouth daily.       No current facility-administered medications for this visit.        Changes to medications: Christian Young reports no changes at this time.    Allergies   Allergen Reactions   ??? Aztreonam Anaphylaxis, Hives, Nausea And Vomiting and Rash     fevers  Patient stated that he only vomited x1 with [place] in the past.????    fevers  Patient stated that he only vomited x1 with [place] in the past.????   ??? [place] [Aztreonam Lysine] Anaphylaxis   ??? Cefepime Itching, Nausea Only and Other (See Comments)     Headaches also   ??? Other Anaphylaxis and Other (See Comments)     Other reaction(s): Other (See Comments)  Bananas: itchy throat  Slo Bid record from [employer] states anaphylaxis.????Pt states this was from childhood and does not know reaction.  Bananas, causes itchy throat   ??? Slo-Bid 100 Anaphylaxis   ??? Tobramycin Tinnitus     From OSH record-documented as tinnitus but has received IV tobra with close monitoring.  Pt has received Tobramycin at [facility] since this allergy documented    From OSH record-documented as tinnitus but has received IV tobra with close monitoring.   ??? Banana Itching and Nausea And Vomiting       Changes to allergies: No    SPECIALTY MEDICATION ADHERENCE     Trikafta 100-50-75 mg: 7 days of medicine on hand     Medication Adherence    Patient reported X missed doses in the last month: 3  Specialty Medication: Trikafta 100-50-75mg  - 2am & 1pm  Patient is on additional specialty medications: No  Informant: patient  Support network for adherence: family member          Specialty medication(s) dose(s) confirmed: Regimen is correct and unchanged.     Are there any concerns with adherence? No - uses his phone alarm to remember when to take medication.    Adherence counseling provided? Not needed    CLINICAL MANAGEMENT AND INTERVENTION      Clinical Benefit Assessment:    Do you feel the medicine is effective or helping your condition? Yes    Clinical Benefit counseling provided? Not needed    Adverse Effects Assessment:    Are you experiencing any side effects? No    Are you experiencing difficulty administering your medicine? No    Quality of Life Assessment:    Quality of Life    Rheumatology  On a scale of 1 - 10 with 1 representing not at all and 10 representing completely - how has your rheumatologic condition affected your:  Oncology  Dermatology          How many days over the past month did your cystic fibrosis  keep you from your normal activities? For example, brushing your teeth or getting up in the morning. 0    Have you discussed this with your provider? Not needed    Acute Infection Status:    Acute infections noted within Epic:  MDR Pseudomonas, CF Patient  Patient reported infection: None    Therapy Appropriateness:    Is therapy appropriate? Yes, therapy is appropriate and should be continued    DISEASE/MEDICATION-SPECIFIC INFORMATION      For CF patients: CF [facility] Active? Yes - active but swept    PATIENT SPECIFIC NEEDS     - Does the patient have any physical, cognitive, or cultural barriers? No    - Is the patient high risk? No    - Does the patient require a Care Management Plan? No     - Does the patient require physician intervention or other additional services (i.e. nutrition, smoking cessation, social work)? No      SHIPPING     Specialty Medication(s) to be Shipped:   CF/Pulmonary: -Trikafta    Other medication(s) to be shipped: No additional medications requested for fill at this time     Changes to insurance: No    Delivery Scheduled: Yes, Expected medication delivery date: 03/16/21.     Medication will be delivered via UPS to the confirmed prescription address in [facility].    The patient will receive a drug information handout for each medication shipped and additional FDA Medication Guides as  required.  Verified that patient has previously received a Conservation officer, historic buildings and a Surveyor, mining.    The patient or caregiver noted above participated in the development of this care plan and knows that they can request review of or adjustments to the care plan at any time.      All of the patient's questions and concerns have been addressed.    Oliva Bustard   [facility] Specialty Pharmacist

## 2021-03-13 NOTE — Unmapped (Signed)
[name] this CF nurse coordinator:     Lina Sayre,  ??  So I am sending a message to you for two reasons I just did a at home pft and it does not look good, I have been feeling sick for a bit and have tried otc meds and it seems to have moved to my chest I think. I am concerned cause I have no cough and it hurts to breath. So what do you think needs to happen?     Reviewed spirometry from [facility] portal, FEV1 was 65%.         Recent clinic PFTs have been 71.9% and 72.6%.     Date of symptom onset: last week on Monday. Started in his head with HA, ear pain, sore throat, fevers. Gone into his chest. Home spirometry didn't look good. Home test kit for COVID was negative.   Caisen notes his SIL was sick (sinus infection), who spent time with him. His wife (who works as an Museum/gallery exhibitions officer) has been okay.    General Symptoms:  Fever:     yes--last fever was 103.2 F (Sunday), taking Tylenol around the clock    Chills:     yes/get cold, while he sleeps the fevers break    Nightsweats:    yes, a lot of sweats with fever breaking    Change in appetite:   no --been eating okay    Change in Energy Level:  yes--sleeping a lot,     Pulmonary Symptoms:   Increased productive cough.  Finds himself to be productive when using his vest. Sputum is dark green in color. Coughing at night and when laying down. Denies hemoptysis. Reports chest pain from coughing, all over his chest. Reports chest tightness, when taking a deep breath--says it also kind of hurts. Hearing some wheezing. More short of breath with ADLs..      Sinus Symptoms:  Clear sinuses, but has some ear pain and a HA that won't go away.   HA is in his forehead Doesn't have much sinus drainage.     Neb Treatments:   Albuterol, HTS, Pulmozyme and Symbicort     Airway Clearance:    Vest three times a day.      Plan:  Feels like he might need to come into the hospital. Thinking his wife would be able to take him tomorrow or Wednesday. He is aware that direct admits are taking weeks to get in and he would likely need to present through the ED.     Will route note to Dr [name].     [name], RN  CF Nurse Coordinator   [phone]

## 2021-03-14 LAB — BASIC METABOLIC PANEL
ANION GAP: 8 mmol/L (ref 5–14)
BLOOD UREA NITROGEN: 16 mg/dL (ref 9–23)
BUN / CREAT RATIO: 20
CALCIUM: 8.7 mg/dL (ref 8.7–10.4)
CHLORIDE: 97 mmol/L — ABNORMAL LOW (ref 98–107)
CO2: 31.1 mmol/L — ABNORMAL HIGH (ref 20.0–31.0)
CREATININE: 0.79 mg/dL
EGFR CKD-EPI (2021) MALE: >90 mL/min/{1.73_m2} (ref >=60)
GLUCOSE RANDOM: 113 mg/dL (ref 70–179)
POTASSIUM: 4.1 mmol/L (ref 3.4–4.8)
SODIUM: 136 mmol/L (ref 135–145)

## 2021-03-14 LAB — COMPREHENSIVE METABOLIC PANEL
ALBUMIN: 3.9 g/dL (ref 3.4–5.0)
ALKALINE PHOSPHATASE: 79 U/L (ref 46–116)
ALT (SGPT): 29 U/L (ref 10–49)
ANION GAP: 8 mmol/L (ref 5–14)
AST (SGOT): 19 U/L (ref <=34)
BILIRUBIN TOTAL: 0.3 mg/dL (ref 0.3–1.2)
BLOOD UREA NITROGEN: 14 mg/dL (ref 9–23)
BUN / CREAT RATIO: 21
CALCIUM: 9 mg/dL (ref 8.7–10.4)
CHLORIDE: 98 mmol/L (ref 98–107)
CO2: 28.3 mmol/L (ref 20.0–31.0)
CREATININE: 0.68 mg/dL
EGFR CKD-EPI (2021) MALE: >90 mL/min/{1.73_m2} (ref >=60)
GLUCOSE RANDOM: 217 mg/dL — ABNORMAL HIGH (ref 70–179)
POTASSIUM: 4.2 mmol/L (ref 3.4–4.8)
PROTEIN TOTAL: 7.2 g/dL (ref 5.7–8.2)
SODIUM: 134 mmol/L — ABNORMAL LOW (ref 135–145)

## 2021-03-14 LAB — BLOOD GAS, VENOUS
BASE EXCESS VENOUS: 4.5 — ABNORMAL HIGH (ref -2.0–2.0)
HCO3 VENOUS: 28 mmol/L — ABNORMAL HIGH (ref 22–27)
O2 SATURATION VENOUS: 92.6 % — ABNORMAL HIGH (ref 40.0–85.0)
PCO2 VENOUS: 44 mmHg (ref 40–60)
PH VENOUS: 7.43 (ref 7.32–7.43)
PO2 VENOUS: 70 mmHg — ABNORMAL HIGH (ref 30–55)

## 2021-03-14 LAB — CBC
HEMATOCRIT: 39.2 % (ref 39.0–48.0)
HEMOGLOBIN: 13.3 g/dL (ref 12.9–16.5)
MEAN CORPUSCULAR HEMOGLOBIN CONC: 34 g/dL (ref 32.0–36.0)
MEAN CORPUSCULAR HEMOGLOBIN: 27.7 pg (ref 25.9–32.4)
MEAN CORPUSCULAR VOLUME: 81.6 fL (ref 77.6–95.7)
MEAN PLATELET VOLUME: 7.5 fL (ref 6.8–10.7)
PLATELET COUNT: 227 10*9/L (ref 150–450)
RED BLOOD CELL COUNT: 4.8 10*12/L (ref 4.26–5.60)
RED CELL DISTRIBUTION WIDTH: 15.5 % — ABNORMAL HIGH (ref 12.2–15.2)
WBC ADJUSTED: 8.4 10*9/L (ref 3.6–11.2)

## 2021-03-14 LAB — CBC W/ AUTO DIFF
BASOPHILS ABSOLUTE COUNT: 0.1 10*9/L (ref 0.0–0.1)
BASOPHILS RELATIVE PERCENT: 1.1 %
EOSINOPHILS ABSOLUTE COUNT: 0.2 10*9/L (ref 0.0–0.5)
EOSINOPHILS RELATIVE PERCENT: 2.5 %
HEMATOCRIT: 39 % (ref 39.0–48.0)
HEMOGLOBIN: 13.3 g/dL (ref 12.9–16.5)
LYMPHOCYTES ABSOLUTE COUNT: 2.5 10*9/L (ref 1.1–3.6)
LYMPHOCYTES RELATIVE PERCENT: 33.2 %
MEAN CORPUSCULAR HEMOGLOBIN CONC: 34.1 g/dL (ref 32.0–36.0)
MEAN CORPUSCULAR HEMOGLOBIN: 27.6 pg (ref 25.9–32.4)
MEAN CORPUSCULAR VOLUME: 81.1 fL (ref 77.6–95.7)
MEAN PLATELET VOLUME: 7.8 fL (ref 6.8–10.7)
MONOCYTES ABSOLUTE COUNT: 0.6 10*9/L (ref 0.3–0.8)
MONOCYTES RELATIVE PERCENT: 8.6 %
NEUTROPHILS ABSOLUTE COUNT: 4.1 10*9/L (ref 1.8–7.8)
NEUTROPHILS RELATIVE PERCENT: 54.6 %
NUCLEATED RED BLOOD CELLS: 0 /100{WBCs} (ref <=4)
PLATELET COUNT: 276 10*9/L (ref 150–450)
RED BLOOD CELL COUNT: 4.8 10*12/L (ref 4.26–5.60)
RED CELL DISTRIBUTION WIDTH: 15.5 % — ABNORMAL HIGH (ref 12.2–15.2)
WBC ADJUSTED: 7.5 10*9/L (ref 3.6–11.2)

## 2021-03-14 LAB — PROTIME-INR
INR: 1.18
INR: 1.09
PROTIME: 13.8 s — ABNORMAL HIGH (ref 10.3–13.4)
PROTIME: 12.7 s (ref 10.3–13.4)

## 2021-03-14 LAB — SEDIMENTATION RATE: ERYTHROCYTE SEDIMENTATION RATE: 8 mm/h (ref 0–15)

## 2021-03-14 LAB — LACTATE, VENOUS, WHOLE BLOOD: LACTATE BLOOD VENOUS: 1.9 mmol/L — ABNORMAL HIGH (ref 0.5–1.8)

## 2021-03-14 LAB — C-REACTIVE PROTEIN: C-REACTIVE PROTEIN: <4 mg/L (ref <=10.0)

## 2021-03-14 MED ADMIN — FLUoxetine (PROzac) capsule 60 mg: 60 mg | ORAL | @ 13:00:00

## 2021-03-14 MED ADMIN — cetirizine (ZyrTEC) tablet 10 mg: 10 mg | ORAL | @ 13:00:00

## 2021-03-14 MED ADMIN — fluticasone furoate-vilanteroL (BREO ELLIPTA) 200-25 mcg/dose inhaler 1 puff: 1 | RESPIRATORY_TRACT | @ 14:00:00

## 2021-03-14 MED ADMIN — insulin lispro (HumaLOG) injection 30 Units: 30 [IU] | SUBCUTANEOUS | @ 17:00:00

## 2021-03-14 MED ADMIN — pramipexole (MIRAPEX) tablet 0.25 mg: .25 mg | ORAL | @ 21:00:00

## 2021-03-14 MED ADMIN — pediatric multivitamin-vit D3 1,500 unit-vit K 800 mcg (MVW COMPLETE FORMULATION) capsule: 2 | ORAL | @ 13:00:00

## 2021-03-14 MED ADMIN — elexacaftor-tezacaftor-ivacaft (TRIKAFTA) 2 ORANGE tablets (PATIENT'S OWN SUPPLY): 2 | ORAL | @ 17:00:00

## 2021-03-14 MED ADMIN — sodium chloride (NS) 0.9 % infusion: 30 mL/h | INTRAVENOUS | @ 07:00:00

## 2021-03-14 MED ADMIN — insulin lispro (HumaLOG) injection 30 Units: 30 [IU] | SUBCUTANEOUS | @ 13:00:00

## 2021-03-14 MED ADMIN — pantoprazole (PROTONIX) EC tablet 20 mg: 20 mg | ORAL | @ 13:00:00

## 2021-03-14 MED ADMIN — albuterol 2.5 mg /3 mL (0.083 %) nebulizer solution 2.5 mg: 2.5 mg | RESPIRATORY_TRACT | @ 20:00:00

## 2021-03-14 MED ADMIN — piperacillin-tazobactam (ZOSYN) IVPB (premix) 4.5 g: 4.5 g | INTRAVENOUS | @ 21:00:00 | Stop: 2021-03-28

## 2021-03-14 MED ADMIN — sodium chloride 7% nebulizer solution 4 mL: 4 mL | RESPIRATORY_TRACT | @ 14:00:00

## 2021-03-14 MED ADMIN — elexacaftor-tezacaftor-ivacaft (TRIKAFTA) one (1) BLUE tablet (PATIENT'S OWN SUPPLY): 1 | ORAL | @ 21:00:00

## 2021-03-14 MED ADMIN — albuterol 2.5 mg /3 mL (0.083 %) nebulizer solution 2.5 mg: 2.5 mg | RESPIRATORY_TRACT | @ 14:00:00

## 2021-03-14 MED ADMIN — oxyCODONE (ROXICODONE) immediate release tablet 5 mg: 5 mg | ORAL | @ 08:00:00 | Stop: 2021-03-14

## 2021-03-14 MED ADMIN — acetaminophen (TYLENOL) tablet 650 mg: 650 mg | ORAL | @ 17:00:00

## 2021-03-14 MED ADMIN — gabapentin (NEURONTIN) capsule 600 mg: 600 mg | ORAL | @ 21:00:00

## 2021-03-14 MED ADMIN — MORPhine 4 mg/mL injection 4 mg: 4 mg | INTRAVENOUS | @ 05:00:00 | Stop: 2021-03-14

## 2021-03-14 MED ADMIN — dornase alfa (PULMOZYME) 1 mg/mL solution 2.5 mg: 2.5 mg | RESPIRATORY_TRACT | @ 14:00:00

## 2021-03-14 MED ADMIN — piperacillin-tazobactam (ZOSYN) IVPB (premix) 4.5 g: 4.5 g | INTRAVENOUS | @ 17:00:00 | Stop: 2021-03-28

## 2021-03-14 MED ADMIN — oxyCODONE (ROXICODONE) immediate release tablet 5 mg: 5 mg | ORAL | @ 21:00:00 | Stop: 2021-03-21

## 2021-03-14 MED ADMIN — polyethylene glycol (MIRALAX) packet 17 g: 17 g | ORAL | @ 13:00:00

## 2021-03-14 MED ADMIN — rivaroxaban (XARELTO) tablet 20 mg: 20 mg | ORAL | @ 13:00:00

## 2021-03-14 MED ADMIN — remdesivir (VEKLURY) 200 mg in sodium chloride (NS) 0.9 % 315 mL IVPB: 200 mg | INTRAVENOUS | @ 13:00:00 | Stop: 2021-03-14

## 2021-03-14 MED ADMIN — insulin lispro (HumaLOG) injection 30 Units: 30 [IU] | SUBCUTANEOUS | @ 21:00:00

## 2021-03-14 MED ADMIN — lisinopriL (PRINIVIL,ZESTRIL) tablet 10 mg: 10 mg | ORAL | @ 13:00:00

## 2021-03-14 MED ADMIN — tobramycin (NEBCIN) 260 mg in sodium chloride (NS) 0.9 % 100 mL IVPB: 260 mg | INTRAVENOUS | @ 10:00:00 | Stop: 2021-03-28

## 2021-03-14 MED ADMIN — oxyCODONE (ROXICODONE) immediate release tablet 5 mg: 5 mg | ORAL | @ 13:00:00 | Stop: 2021-03-14

## 2021-03-14 MED ADMIN — pancrelipase (Lip-Prot-Amyl) (CREON) 24,000-76,000 -120,000 unit delayed release capsule 288,000 units of lipase: 288000 [IU] | ORAL | @ 21:00:00

## 2021-03-14 MED ADMIN — sodium chloride 7% nebulizer solution 4 mL: 4 mL | RESPIRATORY_TRACT | @ 20:00:00

## 2021-03-14 MED ADMIN — oxyCODONE (ROXICODONE) immediate release tablet 5 mg: 5 mg | ORAL | @ 04:00:00 | Stop: 2021-03-13

## 2021-03-14 MED ADMIN — pancrelipase (Lip-Prot-Amyl) (CREON) 24,000-76,000 -120,000 unit delayed release capsule 288,000 units of lipase: 288000 [IU] | ORAL | @ 17:00:00

## 2021-03-14 MED ADMIN — piperacillin-tazobactam (ZOSYN) IVPB (premix) 4.5 g: 4.5 g | INTRAVENOUS | @ 05:00:00 | Stop: 2021-03-14

## 2021-03-14 MED ADMIN — lamoTRIgine (LaMICtal) tablet 200 mg: 200 mg | ORAL | @ 17:00:00

## 2021-03-14 MED ADMIN — gabapentin (NEURONTIN) capsule 600 mg: 600 mg | ORAL | @ 13:00:00

## 2021-03-14 NOTE — Unmapped (Signed)
[facility] Hospitalist Daily Progress Note     LOS: 0 days     Assessment/Plan:  Principal Problem:    Cystic fibrosis with pulmonary exacerbation (CMS-HCC)  Active Problems:    Essential hypertension    Depressive disorder    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)    Pancreatic insufficiency due to cystic fibrosis (CMS-HCC)    History of Mycobacterium abscessus infection    Chronic deep vein thrombosis (DVT) of lower extremity (CMS-HCC)    Restless leg syndrome    Degeneration of lumbar intervertebral disc  Resolved Problems:    * No resolved hospital problems. *         Christian Young is a 35 y.o. male who presented to [facility] with Cystic fibrosis with pulmonary exacerbation (CMS-HCC).    Acute hypoxemic respiratory failure due to COVID-19 infection with Pneumonia       Date of symptom onset: Approximately 1 week prior to admission.         Date of positive SARS-CoV-2 PCR: 03/14/2021        Exposure: Unknown, but notes wife is an EMT, however she is asymptomatic.        Vaccination status:   COVID-19 Vaccine History     Name Date Dose Route    COVID-19 VACC,MRNA,(PFIZER)(PF)(IM) 07/11/2020  1:46 PM 0.3 mL Intramuscular    Site: Right deltoid    COVID-19 VACC,MRNA,(PFIZER)(PF)(IM) 11/21/2019 0.3 mL --    External: Historical information - from other registry    COVID-19 VACC,MRNA,(PFIZER)(PF)(IM) 10/31/2019 0.3 mL Intramuscular    Site: Left deltoid    External: Historical information - from other registry              Therapy: Remdesivir        Oxygen: Room air   - Maintain SpO2 92-96%   - If indicated, Daily labs: CBC, BMP (add hepatic fx panel if on Remdesevir)  - Monitor q48h-72h CRP, D-dimer, LDH      Hypercoagulable state in the setting of COVID-19:   Patient is at increased risk for thrombosis in the setting of acute COVID-19 infection, however given history of DVT he is already on DOAC therapy.   - Patient is not on oxygen and should receive standard VTE pharmacologic prophylaxis.  - Xarelto 20 qd, Home dose and frequency    Advanced care planning  Goals of Care OR Palliative care consult (Page [phone]) if clinical worsening and escalation to ICU expected. Co-morbidities that may increase risk of critical illness include advanced age, obesity, HTN, DM, conditions causing chronic organ failure, or immunocompromising conditions.   - Code status: FULL CODE  - Healthcare proxy: Wife     CF with exacerbation:   While rapid COVID test was NEGATIVE in ED, full RPP returned COVID POSITIVE.  - Pulmonology Medicine consult today  - FU BC, RPP  - f/u CF sputum culture, AFB culture (has previously grown NTM)  - audiology consult in AM (unsure if we can do this at [facility] -- pt has history of tinnitus with tobramycin)  - Zosyn (4.5 mg q6 hours) and IV tobramycin (pharmacy to assist with dosing)  - PFTs now and around day 7-10  - Airway clearance with vest and aerobika TID, albuterol  TID, HTS TID, pulmozyme, symbicort formulary sub  - f/u INR: if PT/INR normal on admission start 5 mg PO vitamin K twice a week while on IV antibiotics; if PT/INR elevated on admission start 5 mg PO vitamin K daily and recheck  in 5 days - if normalized switch to twice a week while on IV antibiotics, if still elevated continue daily while on IV antibiotics  - cont home enzymes and vitamins  - cont home trikafta (pt brought home supply with him)      Other Medical Issues:  CFRD on insulin: follows with [facility], has CGM  - Lantus 50 qhs  - Lispro 30 units TID AC  - SSI Standard Scale   ??  H/o DVT:  - Xarelto 20 qd  ??  Depression:   - cont fluoxetine, Melatonin, Trazodone  ??  HTN:  - Lisinopril 10 qd  ??  Low back pain:   - Robaxan prn   - Oxycodone prn  - Gabapentin 600 TID    ??  RLS:   - cont pramipexole      DVT prophylaxis. Xarelto      Disposition: Floor.      Please page the [facility]) pager at [phone] with questions.      Pending labs:   Pending Labs     Order Current Status    Basic metabolic panel Collected (03/14/21 0725)    CBC Collected (03/14/21 0725)    PT-INR Collected (03/14/21 0725)    Blood Culture #1 In process    Blood Culture #2 In process          Subjective:   No issues at the moment. No known COVID contacts.           Objective:   Physical Exam:    GEN: NAD, supine in bed.     RESPIRATORY: Breathing pattern was normal and the chest moved symmetrically.  Lung sounds were normal and there were no adventitious sounds.    CARDIOVASCULAR: Heart rate and rhythm were normal.  S1 and S2 were normal and there were no extra sounds or murmurs. Jugular venous pressure was normal.  There was no peripheral edema.  ABDOMEN: The abdomen was obese in contour.  Bowel sounds were present.    Palpation detected no tenderness.   NEUROLOGIC: Mental status was normal.  The patient was normally coordinated in the arms.   PSYCHIATRIC: The patient was oriented to person, place, time, and circumstance. Speech was normal. Mood and affect were normal     Vital signs in last 24 hours:  Temp:  [36.5 ??C (97.7 ??F)-36.9 ??C (98.4 ??F)] 36.6 ??C (97.9 ??F)  Heart Rate:  [79-103] 94  SpO2 Pulse:  [82-91] 82  Resp:  [18-24] 18  BP: (114-153)/(74-104) 128/85  MAP (mmHg):  [89-97] 97  SpO2:  [96 %-98 %] 97 %  BMI (Calculated):  [33.2] 33.2    Intake/Output last 24 hours:    Intake/Output Summary (Last 24 hours) at 03/14/2021 0738  Last data filed at 03/14/2021 4540  Gross per 24 hour   Intake 168 ml   Output --   Net 168 ml       Medications:   Scheduled Meds:  ??? albuterol  2.5 mg Nebulization TID   ??? atorvastatin  20 mg Oral Nightly   ??? cetirizine  10 mg Oral Daily   ??? dornase alfa  2.5 mg Inhalation Daily   ??? elexacaftor-tezacaftor-ivacaft  2 tablet Oral Q AM    And   ??? elexacaftor-tezacaftor-ivacaft  1 tablet Oral Q PM   ??? FLUoxetine  60 mg Oral Daily   ??? fluticasone furoate-vilanteroL  1 puff Inhalation Daily (RT)   ??? fluticasone propionate  1 spray Each Nare  Daily   ??? gabapentin  600 mg Oral TID   ??? insulin glargine  50 Units Subcutaneous Nightly   ??? insulin lispro  0-12 Units Subcutaneous ACHS   ??? insulin lispro  30 Units Subcutaneous TID AC   ??? lamoTRIgine  200 mg Oral BID   ??? lisinopriL  10 mg Oral Daily   ??? melatonin  10.5 mg Oral Nightly   ??? montelukast  10 mg Oral Nightly   ??? pancrelipase (Lip-Prot-Amyl)  288,000 units of lipase Oral 3xd Meals   ??? pantoprazole  20 mg Oral Daily   ??? MVW Complete (pediatric multivit 61-D3-vit K)  2 capsule Oral BID   ??? piperacillin-tazobactam (ZOSYN) IV (intermittent)  4.5 g Intravenous Q6H SCH   ??? polyethylene glycol  17 g Oral Daily   ??? pramipexole  0.25 mg Oral Q PM   ??? remdesivir  200 mg Intravenous Once    Followed by   ??? [START ON 03/15/2021] remdesivir  100 mg Intravenous Daily   ??? rivaroxaban  20 mg Oral Daily   ??? senna  2 tablet Oral Nightly   ??? sodium chloride 7%  4 mL Nebulization TID   ??? tobramycin (NEBCIN) IVPB (conventional dosing)  260 mg Intravenous Q12H   ??? traZODone  200 mg Oral Nightly     Continuous Infusions:  ??? sodium chloride 30 mL/hr (03/14/21 0508)       D Linford Arnold MD  [facility] Service.

## 2021-03-14 NOTE — Unmapped (Signed)
Problem: Adult Inpatient Plan of Care  Goal: Plan of Care Review  Outcome: Ongoing - Unchanged  Goal: Patient-Specific Goal (Individualized)  Outcome: Ongoing - Unchanged  Goal: Absence of Hospital-Acquired Illness or Injury  Outcome: Ongoing - Unchanged  Goal: Optimal Comfort and Wellbeing  Outcome: Ongoing - Unchanged  Goal: Readiness for Transition of Care  Outcome: Ongoing - Unchanged  Goal: Rounds/Family Conference  Outcome: Ongoing - Unchanged     Problem: Infection  Goal: Absence of Infection Signs and Symptoms  Outcome: Ongoing - Unchanged

## 2021-03-14 NOTE — Unmapped (Signed)
[name]  Emergency Department Provider Note      ED Clinical Impression     Final diagnoses:   Cystic fibrosis exacerbation (CMS-HCC) (Primary)       Initial Impression, ED Course, Assessment and Plan     Impression: Patient is a 35 y.o. male  with a PMH of CF, HTN, and anxiety presenting for 1 week of shortness of breath with an associated fever (TMax 103F), fatigue, coughing, sinus pain, headache, and chest pain.     On exam, the patient appears well and is in NAD. VS are WNL. Exam is remarkable for crackles in bilateral lung fields. Dry mucous membranes.       Differential includes CF exacerbation vs pneumonia versus pneumonitis versus possible viral upper respiratory infection.    Plan for EKG, CXR, basic labs, VBG, lactate, blood cultures, and Flu/RSV/COVID-19 PCR. Will give morphine and oxycodone.    ED Course as of 03/14/21 0150   Tue Mar 14, 2021   0149 Patient's work-up has been overall reassuring, mildly elevated lactate at 1.9, normal white blood cell count at 7.5, overall reassuring CMP, mildly elevated glucose at 217 and normal pH at 743.  His chest x-ray does show concern for possible pneumonia in the appropriate clinical setting, given patient's multiple comorbidities we will plan for admission for cystic fibrosis with pulmonary exacerbation.  I have contacted the [facility].         ____________________________________________    Time seen: March 13, 2021 11:24 PM       History     Chief Complaint  Shortness of Breath and Fever Between 25 Weeks and 35 Years Old      HPI   Christian Young is a 35 y.o. male with a PMH of CF, HTN, and anxiety who presents to the ED for shortness of breath. The patient reports 1 week of shortness of breath with an associated fever (TMax 103F), fatigue, coughing, sinus pain, headache, and chest pain. His chest pain is worsened with inspiration. He has taken Tylenol for his fever this morning.Of note, the patient's sister is sick, and she lives next door to him. The patient has a history of a lobectomy, but has not had a lung transplant. He is scheduled to see his pulmonologist in November. He denies any nausea, abodminal pain, vomiting, or diarrhea. He was last hospitalized on 11/24/2020 for CF and was treated with IV abx (Zosyn, Tobramycin).        Past Medical History:   Diagnosis Date   ??? Anxiety    ??? Chronic pain disorder    ??? Cystic fibrosis (CMS-HCC)    ??? Depression    ??? Hypertension    ??? Lumbar radiculopathy 10/26/2020   ??? Nonproductive cough 04/05/2018       Past Surgical History:   Procedure Laterality Date   ??? IR INSERT PORT AGE GREATER THAN 5 YRS  03/27/2019    IR INSERT PORT AGE GREATER THAN 5 YRS 03/27/2019 Rush Barer, MD IMG VIR [facility]   ??? PR REMOVAL OF LUNG,LOBECTOMY Right 03/29/2017    Procedure: REMOVAL OF LUNG, OTHER THAN PNEUMONECTOMY; SINGLE LOBE (LOBECTOMY);  Surgeon: Cherie Dark, MD;  Location: MAIN OR [facility];  Service: Thoracic       No current facility-administered medications for this encounter.    Current Outpatient Medications:   ???  albuterol (PROVENTIL HFA;VENTOLIN HFA) 90 mcg/actuation inhaler, Inhale 2 puffs every six (6) hours as needed., Disp: 1 Inhaler,  Rfl: 1  ???  albuterol 2.5 mg /3 mL (0.083 %) nebulizer solution, Inhale 3 mL (2.5 mg total) by nebulization two (2) times a day., Disp: 540 mL, Rfl: 3  ???  amitriptyline (ELAVIL) 100 MG tablet, TAKE ONE TABLET BY MOUTH AT BEDTIME (Patient taking differently: Take 100 mg by mouth nightly.), Disp: 30 tablet, Rfl: 1  ???  atorvastatin (LIPITOR) 20 MG tablet, Take 20 mg by mouth nightly., Disp: , Rfl:   ???  BETHKIS 300 mg/4 mL Nebu, Inhale 1 vial via nebulization every 12 hours for 28 days, alternating every other 28 days, Disp: 224 mL, Rfl: 6  ???  blood sugar diagnostic (GLUCOSE BLOOD) Strp, by Other route Three (3) times a day with a meal. Rx sent to [facility] 01/04/20, Disp: , Rfl:   ???  blood-glucose meter kit, Dispense meter that is preferred by [employer], Disp: 1 each, Rfl: 0  ???  blood-glucose meter,continuous (DEXCOM G6 RECEIVER) Misc, Use as directed, Disp: 1 each, Rfl: 0  ???  blood-glucose sensor (DEXCOM G6 SENSOR) Devi, Use sq as directed every 10 days, Disp: 9 each, Rfl: 11  ???  blood-glucose transmitter (DEXCOM G6 TRANSMITTER) Devi, Use as directed, replace every 90 days, Disp: 1 each, Rfl: 11  ???  budesonide-formoteroL (SYMBICORT) 160-4.5 mcg/actuation inhaler, Inhale 2 puffs Two (2) times a day., Disp: 30.6 g, Rfl: 3  ???  cetirizine (ZYRTEC) 10 MG tablet, Take 1 tablet (10 mg total) by mouth in the morning., Disp: 30 tablet, Rfl: 11  ???  docusate sodium (COLACE) 100 MG capsule, Take 100 mg by mouth in the morning. As needed while on pain meds s/p back surgery., Disp: , Rfl:   ???  dornase alfa (PULMOZYME) 1 mg/mL nebulizer solution, Inhale 2.5 mg daily., Disp: 450 mL, Rfl: 3  ???  EASY TOUCH LANCING DEVICE Misc, Use as directed., Disp: , Rfl:   ???  EASY TOUCH TWIST LANCETS 30 gauge Misc, Use as directed., Disp: , Rfl:   ???  elexacaftor-tezacaftor-ivacaft (TRIKAFTA) 100-50-75 mg(d) /150 mg (n) tablet, Take 2 Tablets (orange) by mouth in the morning and 1 tablet (blue) in the evening with fatty food, Disp: 84 tablet, Rfl: 6  ???  FLUoxetine 60 mg Tab, Take 60 mg by mouth daily. , Disp: , Rfl:   ???  fluticasone propionate (FLONASE) 50 mcg/actuation nasal spray, 1 spray into each nostril daily., Disp: , Rfl:   ???  FREESTYLE LIBRE 14 DAY SENSOR kit, Use 3-4 times daily, Disp: , Rfl:   ???  gabapentin (NEURONTIN) 600 MG tablet, Take 600 mg by mouth Three (3) times a day., Disp: , Rfl:   ???  insulin ASPART (NOVOLOG FLEXPEN) 100 unit/mL (3 mL) injection pen, Use up to 150 units/day, divided 3 TIMES DAILY BEFORE MEALS (Patient taking differently: Inject 30 Units under the skin Three (3) times a day before meals. Plus sliding scale), Disp: 45 mL, Rfl: 0  ???  insulin ASPART (NOVOLOG FLEXPEN) 100 unit/mL (3 mL) injection pen, Use up to 50 units/day, divided TID AC meals as per MD instructions, Disp: 45 mL, Rfl: 12  ???  insulin glargine (BASAGLAR, LANTUS) 100 unit/mL (3 mL) injection pen, Inject 0.7 mL (70 Units total) under the skin daily. (Patient taking differently: Inject 50 Units under the skin nightly.), Disp: 30 mL, Rfl: 0  ???  insulin glargine (BASAGLAR, LANTUS) 100 unit/mL (3 mL) injection pen, Use up to 100 units per day as per MD instructions, Disp: 90 mL, Rfl:  12  ???  lamoTRIgine (LAMICTAL) 200 MG tablet, Take 200 mg by mouth Two (2) times a day., Disp: , Rfl:   ???  lipase-protease-amylase (CREON) 36,000-114,000- 180,000 unit CpDR, Take 8 caps with meals TID and 4 caps with snacks up to three times a day., Disp: 1200 capsule, Rfl: 11  ???  lisinopriL (PRINIVIL,ZESTRIL) 10 MG tablet, Take 1 tablet (10 mg total) by mouth daily., Disp: 30 tablet, Rfl: 0  ???  melatonin 10 mg Tab, Take 10 mg by mouth nightly., Disp: , Rfl:   ???  methocarbamoL (ROBAXIN) 750 MG tablet, Take 500 mg by mouth. Every 6-8 hours as needed, Disp: , Rfl:   ???  montelukast (SINGULAIR) 10 mg tablet, TAKE ONE TABLET BY MOUTH AT BEDTIME (Patient taking differently: Take 10 mg by mouth nightly.), Disp: 90 tablet, Rfl: 3  ???  MVW COMPLETE FORMUL PROBIOTIC 40 billion cell -15 mg CpDR, Take 1 capsule by mouth daily. (Patient taking differently: Take 1 capsule by mouth daily.), Disp: 90 capsule, Rfl: 3  ???  MVW Complete, pediatric multivit 61-D3-vit K, (MVW COMPLETE FORMULATION) 1,500-800 unit-mcg cap, Take 1 capsule by mouth Two (2) times a day. (Patient taking differently: Take 2 capsules by mouth Two (2) times a day.), Disp: , Rfl:   ???  nebulizers Misc, Use as directed with inhaled medications, Disp: 4 each, Rfl: 3  ???  omeprazole (PRILOSEC) 20 MG capsule, Take 1 capsule (20 mg total) by mouth in the morning., Disp: 90 capsule, Rfl: 3  ???  oxyCODONE (OXY-IR) 5 mg capsule, Take 5 mg by mouth every four (4) hours as needed for pain. 5-10mg  prn pain (s/p back surgery), Disp: , Rfl:   ???  pen needle, diabetic (BD ULTRA-FINE NANO PEN NEEDLE) 32 gauge x 5/32 (4 mm) Ndle, use up to 4 times daily, Disp: 400 each, Rfl: 12  ???  polyethylene glycol (MIRALAX) 17 gram packet, Take 17 g by mouth in the morning. As needed while on pain meds s/p back surgery., Disp: , Rfl:   ???  pramipexole (MIRAPEX) 0.125 MG tablet, Take 0.25 mg by mouth every evening. , Disp: , Rfl:   ???  sodium chloride 7% 7 % Nebu, Inhale 4 mL by nebulization Two (2) times a day. Increase to 4 times while taking antibiotics, Disp: , Rfl:   ???  traZODone (DESYREL) 150 MG tablet, 150 mg nightly., Disp: , Rfl:   ???  XARELTO 20 mg tablet, Take 20 mg by mouth daily., Disp: , Rfl:     Allergies  Aztreonam, [place] [aztreonam lysine], Cefepime, Other, Slo-bid 100, Tobramycin, and Banana    Family History   Problem Relation Age of Onset   ??? Bipolar disorder Mother    ??? Depression Mother        Social History  Social History     Tobacco Use   ??? Smoking status: Former Smoker     Types: e-Cigarettes   ??? Smokeless tobacco: Never Used   Substance Use Topics   ??? Alcohol use: Not Currently     Alcohol/week: 3.0 standard drinks     Types: 3 Glasses of wine per week   ??? Drug use: No       Review of Systems  Constitutional: Positive for fever and fatigue.  Eyes: Negative for visual changes.  ENT: Positive for sinus pain. Negative for sore throat.  Cardiovascular: Positive for chest pain.  Respiratory: Positive for shortness of breath and cough.  Gastrointestinal: Negative for abdominal pain, vomiting  or diarrhea.  Genitourinary: Negative for dysuria.  Musculoskeletal: Negative for back pain.  Skin: Negative for rash.  Neurological: Positive for headache. Negative for focal weakness or numbness.    Physical Exam     VITAL SIGNS:    ED Triage Vitals   Enc Vitals Group      BP 03/13/21 1948 140/94      Heart Rate 03/13/21 1948 103      SpO2 Pulse 03/13/21 2238 91      Resp 03/13/21 1948 24      Temp 03/13/21 1948 36.9 ??C (98.4 ??F)      Temp Source 03/13/21 1948 Oral      SpO2 03/13/21 1948 96 % Constitutional: Alert and oriented. Well appearing and in no distress.  Eyes: Conjunctivae are normal.  ENT       Head: Normocephalic and atraumatic.       Nose: No congestion.       Mouth/Throat: Mucous membranes are dry.       Neck: No stridor.  Hematological/Lymphatic/Immunilogical: No cervical lymphadenopathy.  Cardiovascular: Tachycardia, regular rhythm. Normal and symmetric distal pulses are present in all extremities.  Respiratory: Normal respiratory effort. Crackles in bilateral lower lung fields.  Gastrointestinal: Soft and nontender. There is no CVA tenderness.  Musculoskeletal: Nontender with normal range of motion in all extremities.       Right lower leg: No tenderness or edema.       Left lower leg: No tenderness or edema.  Neurologic: Normal speech and language. No gross focal neurologic deficits are appreciated.  Skin: Skin is warm, dry and intact. No rash noted.  Psychiatric: Mood and affect are normal. Speech and behavior are normal.    Radiology     ECG 12 Lead    Result Date: 03/13/2021  SINUS TACHYCARDIA POSSIBLE LEFT ATRIAL ENLARGEMENT BORDERLINE ECG WHEN COMPARED WITH ECG OF 24-Nov-2020 12:47, NO SIGNIFICANT CHANGE WAS FOUND    XR Chest 2 views    Result Date: 03/13/2021  EXAM: XR CHEST 2 VIEWS DATE: 03/13/2021 8:58 PM ACCESSION: [id number] UN DICTATED: 03/13/2021 8:59 PM INTERPRETATION LOCATION: [facility]     CLINICAL INDICATION: 35 years old Male with DYSPNEA      COMPARISON: 11/26/2020     TECHNIQUE: PA and Lateral Chest Radiographs.     FINDINGS:     There are lung volumes. There is blunting of the bilateral costophrenic angles, similar to the prior exam. There is bibasilar airspace opacity, similar to the prior exam. There is a left-sided chest port catheter. The cardiomediastinal silhouette appears similar to the prior exam. No acute osseous abnormality is identified.             Low lung volumes with bibasilar airspace opacity likely atelectasis; however, superimposed infection or aspiration cannot be excluded. Clinical correlation is recommended.          Pertinent labs & imaging results that were available during my care of the patient were reviewed by me and considered in my medical decision making (see chart for details).    Documentation assistance was provided by Meryle Ready, Scribe, on March 13, 2021 at 11:24 PM for Ellen Henri, MD.    March 14, 2021 2:30 AM. Documentation assistance provided by the scribe. I was present during the time the encounter was recorded. The information recorded by the scribe was done at my direction and has been reviewed and validated by me.        Ellen Henri, MD  03/14/21 0230

## 2021-03-14 NOTE — Unmapped (Signed)
Cystic Fibrosis Nutrition Assessment    Inpatient, Telehealth via phone: MD Consult this admission and related follow up  Primary Pulmonary Provider: Dr [name]  ===================================================================  Christian Young is a 35 y.o. male w/ PMH significant for CF, CFRD, pancreatic insufficiency who presented on 8/1 with CF exacerbation. Plan for IV abx.  ===================================================================  INTERVENTION:    1. Modify diet to High Calorie High Protein    2. Recommend CF vitamin regimen:  MVW Complete Formulation gel cap 2 daily as ordered    3. Defer to team re: Vit K 2x weekly while on IV antibiotics given anticoagulation with Xarelto    4. Ordered snacks in computrition per patient request  - Lays, peanut butter and jelly, fruits     5. Adjust enzyme regimen based on patient's home dose  - Creon 24,000 x 11-12 caps/meal, 5-6 caps/snack  - discharge with home enzyme formulation - Creon 36,000s    6. Continue remainder of nutrition regimen:  - acid reducer; protonix  - bowel regimen; miralax daily, senna nightly  - anti-nausea; zofran PRN  - insulin regimen per endo    Inpatient:   Will follow up with patient per protocol: 1-2 times per week (and more frequent as indicated)  Monitor for potential discharge needs with multi-disciplinary team.   ===================================================================  ASSESSMENT:  Nutrition Category = Adult Class 1 Obesity: BMI 30 to < 35 kg/m2    Estimated daily needs: 3717 kcal/day, 126-169 g PRO/day, 3216 mL fluid/day  Calories estimated using Cystic Fibrosis Conference Formula, protein per DRI x 1.5-2, fluid per [facility]    Current diet is appropriate for CF. Current PO intake is adequate to meet estimated CF needs. Patient continues to work towards goals for weight management.   Enzyme dose is within established guidelines. Patient would benefit from adjustment in enzyme regimen. Vitamin prescription is appropriate to reach/maintain optimal fat soluble vitamin levels. Patient may benefit from vitamin K supplementation while on IV antibiotics. Sodium needs for CF met with PO and/or supplement. Bowel regimen is appropriate. Acid reducer appropriate for GERD and enzyme activation. Patient on CFTR modulator is consuming adequate amounts of fat-containing foods with prescribed medication to optimize absorption.    ASPEN/AND Malnutrition Screening:  Patient does not meet ASPEN/AND criteria for malnutrition at this time.    Nutrition Focused Physical Exam:   Assessment not indicated d/t lack of malnutrition risk factors.    Goals:  1. Ongoing:  Meet estimated daily needs  2. Ongoing:  Reach/maintain established anthropometric goals for Adult CF: BMI < 30 kg/m2   3. Ongoing:  Normal fat-soluble vitamin levels: Vitamin A, Vitamin E and PT per lab range; Vitamin D 25OH total >30   4. Ongoing:  Maintain glucose control. Carbohydrate content of diet should comprise 40-50% of total calorie needs, but carbohydrates are not restricted in this population.    5. Ongoing:  Meet sodium needs for CF     Nutrition goals reviewed, and relevant barriers identified and addressed: none evident.   Patient is evaluated to have good  willingness and ability to achieve nutrition goals.   ===================================================================  INPATIENT:  Christian Young is admitted with Cystic fibrosis exacerbation (CMS-HCC) [E84.9].    Current Nutrition Orders (inpatient):  Oral intake        Nutrition Orders   (From admission, onward)             Start     Ordered    03/14/21 0333  Nutrition Therapy Regular/House  Effective now        Question:  Nutrition Therapy:  Answer:  Regular/House    03/14/21 0332              CF Nutrition related medications (inpatient): Nutritionally relevant medications reviewed.  lipitor  trikafta  Insulin regimen per endo  Creon 24,000 x 12 caps/meal, 4 caps/snack  protonix  MVW complete formulation gel cap BID  miralax once daily  Senna once daily  IV antibiotics    CF Nutrition related labs (inpatient): reviewed; PT WNL  ==================================================================  CLINICAL DATA:  Past Medical History:   Diagnosis Date   ??? Anxiety    ??? Chronic pain disorder    ??? Cystic fibrosis (CMS-HCC)    ??? Depression    ??? Hypertension    ??? Lumbar radiculopathy 10/26/2020   ??? Nonproductive cough 04/05/2018     Anthroprometric Evaluation:  Weight changes: weight uptrending since June 2022. Intentional weight loss over the past ~1 year.  CFTR modulator and weight change: On Trikafta  BMI Readings from Last 3 Encounters:   03/14/21 33.20 kg/m??   03/09/21 32.53 kg/m??   02/06/21 30.88 kg/m??     Wt Readings from Last 3 Encounters:   03/14/21 (!) 111.2 kg (245 lb 1.6 oz)   03/09/21 (!) 109 kg (240 lb 3.2 oz)   02/06/21 (!) 103.4 kg (228 lb)     Ht Readings from Last 3 Encounters:   03/14/21 183 cm (6' 0.05)   03/09/21 183 cm (6' 0.05)   02/06/21 183 cm (6' 0.05)     ==================================================================  Energy Intake (outpatient):  Diet: [name] endorses stable appetite and PO intake prior to admission. No reported nutrition concerns or questions.    Allergies, Intolerances, Sensitivities, and/or Cultural/Religious Dietary Restrictions:  See below  Allergies   Allergen Reactions   ??? Aztreonam Anaphylaxis, Hives, Nausea And Vomiting and Rash     fevers  Patient stated that he only vomited x1 with [place] in the past.????    fevers  Patient stated that he only vomited x1 with [place] in the past.????   ??? [place] [Aztreonam Lysine] Anaphylaxis   ??? Cefepime Itching, Nausea Only and Other (See Comments)     Headaches also   ??? Other Anaphylaxis and Other (See Comments)     Other reaction(s): Other (See Comments)  Bananas: itchy throat  Slo Bid record from [employer] states anaphylaxis.????Pt states this was from childhood and does not know reaction.  Bananas, causes itchy throat   ??? Slo-Bid 100 Anaphylaxis   ??? Tobramycin Tinnitus     From OSH record-documented as tinnitus but has received IV tobra with close monitoring.  Pt has received Tobramycin at [facility] since this allergy documented    From OSH record-documented as tinnitus but has received IV tobra with close monitoring.   ??? Banana Itching and Nausea And Vomiting   Sodium in diet: Adequate from diet  Calcium in diet:  Adequate from diet  Food Insecurity:    -   Food Insecurity: No Food Insecurity   ??? Worried About Programme researcher, broadcasting/film/video in the Last Year: Never true   ??? Ran Out of Food in the Last Year: Never true   CFTR modulator and Diet: Prescribed Trikafta (elexacaftor/tezacaftor/ivacaftor).  PO Supplements: none  Patient resources for DME/formula: n/a  Appetite Stimulant: none  Enteral feeding tube: none  Physical activity: limited recently 2/2 back pain; not discussed today    GI/Malabsorption (outpatient):  Enzyme brand, (meals/snacks):  Creon 36,000 @ 8/meal and 4/snack  Enzyme administration details: correct pre-meal administration., good compliance at all meals and snacks  Enzyme dose per MEAL (units lipase/kg/meal) 2589  Enzyme dose per DAY (units lipase/kg/day) 57846  GI meds:  Nutritionally relevant medications reviewed. Miralax, probiotic  Stools (steatorrhea): 1-2 daily, not greasy  Stools (constipation): no s/s of constipation  GI symptoms:  none  Fecal Fat Studies: pancreatic insufficient    No results found for: [id number]  Lab Results   Component Value Date    ELAST <15 (L) 03/19/2017     No results found for: PELAI    Vitamins/Minerals (outpatient):  CF-specific MVI, dose, compliance: MVW Complete Formulation Softgel regular 2 daily, good compliance  Other vitamins/minerals/herbals: none  Patient Resources for vitamins: -none  Calcium supplement: none  Fat-soluble vitamin levels: within normal limits in June  Lab Results   Component Value Date    VITAMINA 58.7 02/06/2021    VITAMINA 42.4 01/18/2020    VITAMINA 44.4 11/19/2016     Lab Results   Component Value Date    CRP <4.0 03/14/2021    CRP <4.0 11/24/2020    CRP <4.0 06/28/2020     Lab Results   Component Value Date    VITDTOTAL 38.2 02/06/2021    VITDTOTAL 27.9 03/19/2017     Lab Results   Component Value Date    VITAME 16.0 02/06/2021     Lab Results   Component Value Date    PT 12.7 03/14/2021    PT 13.8 (H) 03/14/2021    PT 21.2 (H) 12/01/2020     Lab Results   Component Value Date    DESGCARBPT 0.2 03/20/2017     No results found for: PIVKAII    Bone Health: Normal vitamin D level. Adequate calcium intake from diet. Due for DEXA next year. Normal bone density July 2018.     CF Related Diabetes: yes, on insulin    Lab Results   Component Value Date    GLUF 178 (H) 01/18/2020     No results found for: GLUCOSE2HR  Lab Results   Component Value Date    A1C 10.0 (H) 03/09/2021    A1C 10.7 (H) 11/29/2020    A1C 9.4 (H) 01/18/2020     Jackqulyn Livings MPH, RD, LDN  Pager: [phone]

## 2021-03-14 NOTE — Unmapped (Signed)
Aminoglycoside Therapeutic Monitoring Pharmacy Note    Christian Young is a 35 y.o. male starting tobramycin. Date of therapy initiation: 03/14/21    Indication: CF exacerbation    Prior Dosing Information: Previous regimen 260 mg IV q 12 hrs (April 2022)     Goals:  Therapeutic Drug Levels  ?? Trough level: tobramycin <1 mg/L  ?? Peak level: tobramycin 10-14 mg/L    Additional Clinical Monitoring/Outcomes  Renal function, volume status (intake and output)    Results:   ?? Not applicable   ?? Not applicable    Wt Readings from Last 1 Encounters:   03/14/21 (!) 111.2 kg (245 lb 1.6 oz)     Lab Results   Component Value Date    CREATININE 0.68 03/14/2021       Pharmacokinetic Considerations and Significant Drug Interactions:  ??? Adult (estimated initial): Vd = 29.8 L, ke = 0.33 hr-1  ??? Concurrent nephrotoxic meds: Zosyn    Assessment/Plan:  Recommendation(s)  ??? Start tobramycin 260 mg IV q 12 hrs  ??? Estimated peak and trough on recommended regimen: peak = 10.5 mg/L, trough = 0.1 mg/L    Follow-up  ??? Level due: at 2 hrs and 8 hrs after first or second dose  ??? A pharmacist will continue to monitor and order levels as appropriate    Please page service pharmacist with questions/clarifications.    Malaysha Arlen T Aylanie Cubillos, RPh

## 2021-03-14 NOTE — Unmapped (Signed)
Pt arrives with c/o CF stating he is having an exacerbation.  Pt stating he is feeling sob, coughing, having chest pain, and fevers at home with tmax of 103 F.

## 2021-03-14 NOTE — Unmapped (Signed)
PULMONARY CONSULT  NOTE      Patient: Christian Young(10-20-1985)  Reason for consultation: Christian Young is a 35 y.o. male who is seen in consultation at the request of Hubbard Robinson, MD for comprehensive evaluation of COVID in CF exacerbation.    Assessment and Recommendations:      Principal Problem:    Cystic fibrosis with pulmonary exacerbation (CMS-HCC)  Active Problems:    Essential hypertension    Depressive disorder    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)    Pancreatic insufficiency due to cystic fibrosis (CMS-HCC)    History of Mycobacterium abscessus infection    Chronic deep vein thrombosis (DVT) of lower extremity (CMS-HCC)    Restless leg syndrome    Degeneration of lumbar intervertebral disc  Resolved Problems:    * No resolved hospital problems. *    Christian Youngis a 35 y.o.??male??with PMHx of CF who??presented to [facility] with??Cystic fibrosis with pulmonary exacerbation (CMS-HCC) secondary to COVID.  ??  1. Acute exacerbation of CF bronchiectasis with COVID:   Agree with treatment with Remdesivir. He is not hypoxemic and overall does not have as much cough/sputum as usual. Plan for sputum culture and see how he does over the next few days to determine how much is COVID related vs some bacterial infection. Prior MSSA and 2 variants of PsA (one pan resistant and one with intermediate resistance).  For now continue Zosyn??4.5 mg q6h, tobramycin??IV, pharmacy to dose, does BID dosing.   - Continue Trikafta (non-formulary), Breo (formulary alternative for Symbicort) and Pulmozyme.   - Airway clearance with albuterol TID, 7% hypertonic saline, chest vest, and aerobika  ??  2. CFRD:   - Continue current insulin regimen.   ??    We appreciate the opportunity to assist in the care of this patient.  Please page [phone] with any questions.    Pablo Ledger, MD    Subjective:      History of Present Illness:  Christian Young is a 34 y.o. male admitted for cystic fibrosis exacerbation. He has a history of CF (G401U and [id number] insertion) on trikafta, CFRD on insulin, history of DVT (on xarelto currently), with recent baseline lung function of FEV1 ~72% .  ??  Last week started feeling fatigued with sinus symptoms which is typical of his exacerbations but then started having fevers which is unusual. He home COVID tested several days ago and was negative. He then presented to the ED last night, was COVID positive on the RPR (rapid was negative first).     He has been having chest pain - typical during exacerbations but also whole body aches and pains which is atypical for him.   ??    Review of Systems: A comprehensive review of systems was performed and was negative except as above in HPI  Past Medical History:   Diagnosis Date   ??? Anxiety    ??? Chronic pain disorder    ??? Cystic fibrosis (CMS-HCC)    ??? Depression    ??? Hypertension    ??? Lumbar radiculopathy 10/26/2020   ??? Nonproductive cough 04/05/2018     Past Surgical History:   Procedure Laterality Date   ??? IR INSERT PORT AGE GREATER THAN 5 YRS  03/27/2019    IR INSERT PORT AGE GREATER THAN 5 YRS 03/27/2019 Rush Barer, MD IMG [facility]IR [facility]   ??? PR REMOVAL OF LUNG,LOBECTOMY Right 03/29/2017    Procedure: REMOVAL OF LUNG, OTHER THAN PNEUMONECTOMY; SINGLE  LOBE (LOBECTOMY);  Surgeon: Cherie Dark, MD;  Location: MAIN OR [facility];  Service: Thoracic     Medications reviewed in Epic  Allergies as of 03/13/2021 - Reviewed 03/13/2021   Allergen Reaction Noted   ??? Aztreonam Anaphylaxis, Hives, Nausea And Vomiting, and Rash 09/06/2015   ??? [place] [aztreonam lysine] Anaphylaxis 12/27/2016   ??? Cefepime Itching, Nausea Only, and Other (See Comments) 09/06/2015   ??? Other Anaphylaxis and Other (See Comments) 09/06/2015   ??? Slo-bid 100 Anaphylaxis 06/20/2017   ??? Tobramycin Tinnitus 09/06/2015   ??? Banana Itching and Nausea And Vomiting 07/29/2016     Family History   Problem Relation Age of Onset   ??? Bipolar disorder Mother    ??? Depression Mother Social History     Tobacco Use   ??? Smoking status: Former Smoker     Types: e-Cigarettes   ??? Smokeless tobacco: Never Used   Substance Use Topics   ??? Alcohol use: Not Currently     Alcohol/week: 3.0 standard drinks     Types: 3 Glasses of wine per week        Objective:      Physical Exam:  Vitals:    03/14/21 0059 03/14/21 0249 03/14/21 0340 03/14/21 0913   BP: 114/78 122/74 128/85 122/82   Pulse: 82 79 94 93   Resp: 24 20 18     Temp: 36.9 ??C (98.4 ??F) 36.5 ??C (97.7 ??F) 36.6 ??C (97.9 ??F) 37 ??C (98.6 ??F)   TempSrc: Oral Oral Oral Oral   SpO2: 96% 97% 97% 98%   Weight:   (!) 111.2 kg (245 lb 1.6 oz)    Height:   183 cm (6' 0.05)      General: NAD  Eyes: Anicteric sclera  ENT:  Mucous membranes moist and intact.  Lymph: No cervical or supraclavicular adenopathy.  Lungs: Good air movement bilaterally. No wheezes or crackles.  Cardiovascular: Regular rate and rhythm.  Abdomen: Soft, non-tender, not distended, bowel sounds are normal  Musculoskeletal: No synovitis.  Skin: No rashes or lesions.  Neuro: No focal neurological deficits.    Malnutrition Assessment by RD:          Diagnostic Review:   All labs and images were personally reviewed.

## 2021-03-14 NOTE — Unmapped (Signed)
General Medicine History and Physical    Assessment/Plan:    Principal Problem:    Cystic fibrosis with pulmonary exacerbation (CMS-HCC)  Active Problems:    Essential hypertension    Depressive disorder    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)    Pancreatic insufficiency due to cystic fibrosis (CMS-HCC)    History of Mycobacterium abscessus infection    Chronic deep vein thrombosis (DVT) of lower extremity (CMS-HCC)    Restless leg syndrome    Degeneration of lumbar intervertebral disc      Christian Young is a 35 y.o. male with PMHx CF, CFRD on insulin, h/o DVT on Xarelto, HTN, depression, low back pain  that presented to [facility] with Cystic fibrosis with pulmonary exacerbation (CMS-HCC).    CF with exacerbation: COVID test negative in ED, full RPP pending. Most likely symptoms are consistent with CF exacerbation. CXR with low lung volumes with bibasilar airspace opacity likely atelectasis however superimposed infection cannot be excluded.    - pulmonology consult in AM  - f/u blood cultures, RPP  - f/u CF sputum culture, AFB culture (has previously grown NTM)  - audiology consult in AM (unsure if we can do this at [facility] -- pt has history of tinnitus with tobramycin)  - start zosyn (4.5 mg q6 hours) and IV tobramycin (pharmacy to assist with dosing)  - PFTs now and around day 7-10  - airway clearance with vest and aerobika TID, albuterol  TID, HTS TID, pulmozyme, symbicort formulary sub  - f/u INR: if PT/INR normal on admission start 5 mg PO vitamin K twice a week while on IV antibiotics; if PT/INR elevated on admission start 5 mg PO vitamin K daily and recheck in 5 days - if normalized switch to twice a week while on IV antibiotics, if still elevated continue daily while on IV antibiotics  - cont home enzymes and vitamins  - cont home trikafta (pt brought home supply with him)    CFRD on insulin: follows with endocrinology, has CGM  - lantus 50 units at bedtime  - lispro 30 units TID AC  - SSI    H/o DVT:  - cont xarelto, f/u INR as above    Depression:   - cont fluoxetine, trazodone    HTN:  - cont lisinopril    Low back pain:   - cont robaxan prn and oxycodone prn  - cont gabapentin    RLS:   - cont pramipexole    ___________________________________________________________________    Chief Complaint:  Chief Complaint   Patient presents with   ??? Shortness of Breath   ??? Fever Between 9 Weeks and 33 Years Old     Cystic fibrosis with pulmonary exacerbation (CMS-HCC)    HPI:  Christian Young is a 35 y.o. male with PMHx CF, CFRD on insulin, h/o DVT on Xarelto, HTN, depression, low back pain that presented to [facility] with Cystic fibrosis with pulmonary exacerbation (CMS-HCC). Patient states that last week he began feeling fatigued and had some sinus symptoms (headache, ear pain, low energy). He notes that this is how his exacerbations typically present. Yesterday he noticed some mucous production with vest treatments, mucous was green in color. He also notes that he has felt feverish and a home temp was 103F. His home spirometry was down to 65% (baseline ~72%). He states all of these symptoms (except the fever which is new) are consistent with CF exacerbations. He called his pulmonologist who recommended he come to the  ED.     Patient denies abdominal pain, urinary symptoms, blood in sputum.    Allergies:  Aztreonam, [place] [aztreonam lysine], Cefepime, Other, Slo-bid 100, Tobramycin, and Banana    Medications:   Prior to Admission medications    Medication Dose, Route, Frequency   albuterol (PROVENTIL HFA;VENTOLIN HFA) 90 mcg/actuation inhaler 2 puffs, Inhalation, Every 6 hours PRN   albuterol 2.5 mg /3 mL (0.083 %) nebulizer solution 2.5 mg, Nebulization, 2 times a day   amitriptyline (ELAVIL) 100 MG tablet TAKE ONE TABLET BY MOUTH AT BEDTIME  Patient taking differently: Take 100 mg by mouth nightly.   atorvastatin (LIPITOR) 20 MG tablet 20 mg, Oral, Nightly   BETHKIS 300 mg/4 mL Nebu Inhale 1 vial via nebulization every 12 hours for 28 days, alternating every other 28 days   blood sugar diagnostic (GLUCOSE BLOOD) Strp Other, 3 times a day (with meals), Rx sent to [facility] 01/04/20    blood-glucose meter kit Dispense meter that is preferred by patient's insurance company   blood-glucose meter,continuous (DEXCOM G6 RECEIVER) Misc Use as directed   blood-glucose sensor (DEXCOM G6 SENSOR) Devi Use sq as directed every 10 days   blood-glucose transmitter (DEXCOM G6 TRANSMITTER) Devi Use as directed, replace every 90 days   budesonide-formoteroL (SYMBICORT) 160-4.5 mcg/actuation inhaler 2 puffs, Inhalation, 2 times a day (standard)   cetirizine (ZYRTEC) 10 MG tablet 10 mg, Oral, Daily (standard)   docusate sodium (COLACE) 100 MG capsule 100 mg, Oral, Daily (standard), As needed while on pain meds s/p back surgery   dornase alfa (PULMOZYME) 1 mg/mL nebulizer solution 2.5 mg, Inhalation, Daily (standard)   EASY TOUCH LANCING DEVICE Misc Use as directed.   EASY TOUCH TWIST LANCETS 30 gauge Misc Use as directed.   elexacaftor-tezacaftor-ivacaft (TRIKAFTA) 100-50-75 mg(d) /150 mg (n) tablet Take 2 Tablets (orange) by mouth in the morning and 1 tablet (blue) in the evening with fatty food   FLUoxetine 60 mg Tab 60 mg, Oral, Daily (standard)   fluticasone propionate (FLONASE) 50 mcg/actuation nasal spray 1 spray, Each Nare, Daily (standard)   FREESTYLE LIBRE 14 DAY SENSOR kit Use 3-4 times daily   gabapentin (NEURONTIN) 600 MG tablet 600 mg, Oral, 3 times a day (standard)   insulin ASPART (NOVOLOG FLEXPEN) 100 unit/mL (3 mL) injection pen Use up to 150 units/day, divided 3 TIMES DAILY BEFORE MEALS  Patient taking differently: Inject 30 Units under the skin Three (3) times a day before meals. Plus sliding scale   insulin ASPART (NOVOLOG FLEXPEN) 100 unit/mL (3 mL) injection pen Use up to 50 units/day, divided TID AC meals as per MD instructions   insulin glargine (BASAGLAR, LANTUS) 100 unit/mL (3 mL) injection pen 70 Units, Subcutaneous, Daily (standard)  Patient taking differently: Inject 50 Units under the skin nightly.   insulin glargine (BASAGLAR, LANTUS) 100 unit/mL (3 mL) injection pen Use up to 100 units per day as per MD instructions   lamoTRIgine (LAMICTAL) 200 MG tablet 200 mg, Oral, 2 times a day (standard)   lipase-protease-amylase (CREON) 36,000-114,000- 180,000 unit CpDR Take 8 caps with meals TID and 4 caps with snacks up to three times a day.   lisinopriL (PRINIVIL,ZESTRIL) 10 MG tablet 10 mg, Oral, Daily (standard)   melatonin 10 mg Tab 10 mg, Oral, Nightly   methocarbamoL (ROBAXIN) 750 MG tablet Take 500 mg by mouth. Every 6-8 hours as needed   montelukast (SINGULAIR) 10 mg tablet TAKE ONE TABLET BY MOUTH AT BEDTIME  Patient taking differently: Take  10 mg by mouth nightly.   MVW COMPLETE FORMUL PROBIOTIC 40 billion cell -15 mg CpDR 1 capsule, Oral, Daily (standard)  Patient taking differently: Take 1 capsule by mouth daily.   MVW Complete, pediatric multivit 61-D3-vit K, (MVW COMPLETE FORMULATION) 1,500-800 unit-mcg cap 1 capsule, Oral, 2 times a day (standard)  Patient taking differently: Take 2 capsules by mouth Two (2) times a day.   nebulizers Misc Use as directed with inhaled medications   omeprazole (PRILOSEC) 20 MG capsule 20 mg, Oral, Daily (standard)   oxyCODONE (OXY-IR) 5 mg capsule 5 mg, Oral, Every 4 hours PRN, 5-10mg  prn pain (s/p back surgery)   pen needle, diabetic (BD ULTRA-FINE NANO PEN NEEDLE) 32 gauge x 5/32 (4 mm) Ndle use up to 4 times daily   polyethylene glycol (MIRALAX) 17 gram packet 17 g, Oral, Daily (standard), As needed while on pain meds s/p back surgery   pramipexole (MIRAPEX) 0.125 MG tablet 0.25 mg, Oral, Every evening   sodium chloride 7% 7 % Nebu Inhale 4 mL by nebulization Two (2) times a day. Increase to 4 times while taking antibiotics   traZODone (DESYREL) 150 MG tablet 150 mg, Nightly   XARELTO 20 mg tablet 20 mg, Oral, Daily (standard)       Medical History:  Past Medical History:   Diagnosis Date   ??? Anxiety    ??? Chronic pain disorder    ??? Cystic fibrosis (CMS-HCC)    ??? Depression    ??? Hypertension    ??? Lumbar radiculopathy 10/26/2020   ??? Nonproductive cough 04/05/2018       Surgical History:  Past Surgical History:   Procedure Laterality Date   ??? IR INSERT PORT AGE GREATER THAN 5 YRS  03/27/2019    IR INSERT PORT AGE GREATER THAN 5 YRS 03/27/2019 Rush Barer, MD IMG [facility]IR [facility]   ??? PR REMOVAL OF LUNG,LOBECTOMY Right 03/29/2017    Procedure: REMOVAL OF LUNG, OTHER THAN PNEUMONECTOMY; SINGLE LOBE (LOBECTOMY);  Surgeon: Cherie Dark, MD;  Location: MAIN OR [facility];  Service: Thoracic       Social History:  Social History     Socioeconomic History   ??? Marital status: Married   Tobacco Use   ??? Smoking status: Former Smoker     Types: e-Cigarettes   ??? Smokeless tobacco: Never Used   Substance and Sexual Activity   ??? Alcohol use: Not Currently     Alcohol/week: 3.0 standard drinks     Types: 3 Glasses of wine per week   ??? Drug use: No   ??? Sexual activity: Not Currently     Social Determinants of Health     Financial Resource Strain: Low Risk    ??? Difficulty of Paying Living Expenses: Not very hard   Food Insecurity: No Food Insecurity   ??? Worried About Running Out of Food in the Last Year: Never true   ??? Ran Out of Food in the Last Year: Never true   Transportation Needs: No Transportation Needs   ??? Lack of Transportation (Medical): No   ??? Lack of Transportation (Non-Medical): No     Alcohol Use: Not on file       Family History:  Family History   Problem Relation Age of Onset   ??? Bipolar disorder Mother    ??? Depression Mother        Review of Systems:  10 systems reviewed and are negative unless otherwise mentioned in HPI    Labs/Studies:  Labs and Studies from the last 24hrs per EMR and Reviewed    Physical Exam:  Temp:  [36.9 ??C (98.4 ??F)] 36.9 ??C (98.4 ??F)  Heart Rate:  [82-103] 82  SpO2 Pulse:  [82-91] 82  Resp:  [20-24] 24  BP: (114-153)/(78-104) 114/78  SpO2:  [96 %-98 %] 96 %    General: well-developed, sitting comfortably in stretcher, no acute distress  HEENT:  EOMI. Sclerae anicteric and without injection. Oropharynx moist.   NECK: trachea midline and mobile, no submandibular or cervical lymphadenopathy  RESP: crackles in bilateral lung fields, no increased work of breathing on room air  CV: Regular rate and rhythm. Normal S1 and S2. No murmurs or gallops.   ABD: NABS, soft, non-distended, non-tender to palpation  EXT: No lower extremity edema, cyanosis, or clubbing. Posterior tibial pulses are 2+ and symmetric.   MSK: No edema, normal range of motion  SKIN: No rashes or lesions  NEURO: No focal neurologic deficits, A/Ox3

## 2021-03-15 LAB — BASIC METABOLIC PANEL
ANION GAP: 10 mmol/L (ref 5–14)
BLOOD UREA NITROGEN: 16 mg/dL (ref 9–23)
BUN / CREAT RATIO: 19
CALCIUM: 8.3 mg/dL — ABNORMAL LOW (ref 8.7–10.4)
CHLORIDE: 98 mmol/L (ref 98–107)
CO2: 28.9 mmol/L (ref 20.0–31.0)
CREATININE: 0.85 mg/dL
EGFR CKD-EPI (2021) MALE: >90 mL/min/{1.73_m2} (ref >=60)
GLUCOSE RANDOM: 137 mg/dL (ref 70–179)
POTASSIUM: 4 mmol/L (ref 3.4–4.8)
SODIUM: 137 mmol/L (ref 135–145)

## 2021-03-15 LAB — CBC
HEMATOCRIT: 37.1 % — ABNORMAL LOW (ref 39.0–48.0)
HEMOGLOBIN: 12.6 g/dL — ABNORMAL LOW (ref 12.9–16.5)
MEAN CORPUSCULAR HEMOGLOBIN CONC: 34 g/dL (ref 32.0–36.0)
MEAN CORPUSCULAR HEMOGLOBIN: 27.7 pg (ref 25.9–32.4)
MEAN CORPUSCULAR VOLUME: 81.5 fL (ref 77.6–95.7)
MEAN PLATELET VOLUME: 7.7 fL (ref 6.8–10.7)
PLATELET COUNT: 221 10*9/L (ref 150–450)
RED BLOOD CELL COUNT: 4.55 10*12/L (ref 4.26–5.60)
RED CELL DISTRIBUTION WIDTH: 15.2 % (ref 12.2–15.2)
WBC ADJUSTED: 4.9 10*9/L (ref 3.6–11.2)

## 2021-03-15 LAB — TOBRAMYCIN LEVEL, TROUGH: TOBRAMYCIN TROUGH: <0.3 ug/mL (ref 0.0–2.0)

## 2021-03-15 LAB — TOBRAMYCIN LEVEL, PEAK: TOBRAMYCIN PEAK: 7.2 ug/mL (ref 4.0–10.0)

## 2021-03-15 LAB — PROTIME-INR
INR: 1.27
PROTIME: 14.8 s — ABNORMAL HIGH (ref 10.3–13.4)

## 2021-03-15 MED ADMIN — piperacillin-tazobactam (ZOSYN) IVPB (premix) 4.5 g: 4.5 g | INTRAVENOUS | @ 22:00:00 | Stop: 2021-03-28

## 2021-03-15 MED ADMIN — tobramycin (NEBCIN) 260 mg in sodium chloride (NS) 0.9 % 100 mL IVPB: 260 mg | INTRAVENOUS | @ 10:00:00 | Stop: 2021-03-28

## 2021-03-15 MED ADMIN — fluticasone furoate-vilanteroL (BREO ELLIPTA) 200-25 mcg/dose inhaler 1 puff: 1 | RESPIRATORY_TRACT | @ 16:00:00

## 2021-03-15 MED ADMIN — insulin lispro (HumaLOG) injection 0-12 Units: 0-12 [IU] | SUBCUTANEOUS | @ 02:00:00

## 2021-03-15 MED ADMIN — gabapentin (NEURONTIN) capsule 600 mg: 600 mg | ORAL | @ 19:00:00

## 2021-03-15 MED ADMIN — insulin glargine (LANTUS) injection 50 Units: 50 [IU] | SUBCUTANEOUS | @ 02:00:00

## 2021-03-15 MED ADMIN — acetaminophen (TYLENOL) tablet 650 mg: 650 mg | ORAL | @ 02:00:00

## 2021-03-15 MED ADMIN — traZODone (DESYREL) tablet 200 mg: 200 mg | ORAL | @ 02:00:00

## 2021-03-15 MED ADMIN — oxyCODONE (ROXICODONE) immediate release tablet 5 mg: 5 mg | ORAL | @ 17:00:00 | Stop: 2021-03-21

## 2021-03-15 MED ADMIN — elexacaftor-tezacaftor-ivacaft (TRIKAFTA) 2 ORANGE tablets (PATIENT'S OWN SUPPLY): 2 | ORAL | @ 13:00:00

## 2021-03-15 MED ADMIN — piperacillin-tazobactam (ZOSYN) IVPB (premix) 4.5 g: 4.5 g | INTRAVENOUS | @ 16:00:00 | Stop: 2021-03-28

## 2021-03-15 MED ADMIN — pramipexole (MIRAPEX) tablet 0.25 mg: .25 mg | ORAL | @ 22:00:00

## 2021-03-15 MED ADMIN — rivaroxaban (XARELTO) tablet 20 mg: 20 mg | ORAL | @ 13:00:00

## 2021-03-15 MED ADMIN — dornase alfa (PULMOZYME) 1 mg/mL solution 2.5 mg: 2.5 mg | RESPIRATORY_TRACT | @ 16:00:00

## 2021-03-15 MED ADMIN — insulin lispro (HumaLOG) injection 30 Units: 30 [IU] | SUBCUTANEOUS | @ 16:00:00

## 2021-03-15 MED ADMIN — pantoprazole (PROTONIX) EC tablet 20 mg: 20 mg | ORAL | @ 13:00:00

## 2021-03-15 MED ADMIN — remdesivir (VEKLURY) 100 mg in sodium chloride (NS) 0.9 % 295 mL IVPB: 100 mg | INTRAVENOUS | @ 13:00:00 | Stop: 2021-03-19

## 2021-03-15 MED ADMIN — ondansetron (ZOFRAN-ODT) disintegrating tablet 4 mg: 4 mg | ORAL | @ 02:00:00

## 2021-03-15 MED ADMIN — elexacaftor-tezacaftor-ivacaft (TRIKAFTA) one (1) BLUE tablet (PATIENT'S OWN SUPPLY): 1 | ORAL | @ 22:00:00

## 2021-03-15 MED ADMIN — polyethylene glycol (MIRALAX) packet 17 g: 17 g | ORAL | @ 13:00:00

## 2021-03-15 MED ADMIN — albuterol 2.5 mg /3 mL (0.083 %) nebulizer solution 2.5 mg: 2.5 mg | RESPIRATORY_TRACT | @ 01:00:00

## 2021-03-15 MED ADMIN — lamoTRIgine (LaMICtal) tablet 200 mg: 200 mg | ORAL | @ 13:00:00

## 2021-03-15 MED ADMIN — piperacillin-tazobactam (ZOSYN) IVPB (premix) 4.5 g: 4.5 g | INTRAVENOUS | @ 04:00:00 | Stop: 2021-03-28

## 2021-03-15 MED ADMIN — tobramycin (NEBCIN) 260 mg in sodium chloride (NS) 0.9 % 100 mL IVPB: 260 mg | INTRAVENOUS | @ 02:00:00 | Stop: 2021-03-28

## 2021-03-15 MED ADMIN — insulin lispro (HumaLOG) injection 30 Units: 30 [IU] | SUBCUTANEOUS | @ 22:00:00

## 2021-03-15 MED ADMIN — gabapentin (NEURONTIN) capsule 600 mg: 600 mg | ORAL | @ 02:00:00

## 2021-03-15 MED ADMIN — acetaminophen (TYLENOL) tablet 650 mg: 650 mg | ORAL | @ 11:00:00

## 2021-03-15 MED ADMIN — senna (SENOKOT) tablet 2 tablet: 2 | ORAL | @ 02:00:00

## 2021-03-15 MED ADMIN — insulin lispro (HumaLOG) injection 30 Units: 30 [IU] | SUBCUTANEOUS | @ 13:00:00

## 2021-03-15 MED ADMIN — piperacillin-tazobactam (ZOSYN) IVPB (premix) 4.5 g: 4.5 g | INTRAVENOUS | @ 11:00:00 | Stop: 2021-03-28

## 2021-03-15 MED ADMIN — albuterol 2.5 mg /3 mL (0.083 %) nebulizer solution 2.5 mg: 2.5 mg | RESPIRATORY_TRACT | @ 16:00:00

## 2021-03-15 MED ADMIN — sodium chloride 7% nebulizer solution 4 mL: 4 mL | RESPIRATORY_TRACT | @ 16:00:00

## 2021-03-15 MED ADMIN — methocarbamoL (ROBAXIN) tablet 500 mg: 500 mg | ORAL | @ 02:00:00

## 2021-03-15 MED ADMIN — lisinopriL (PRINIVIL,ZESTRIL) tablet 10 mg: 10 mg | ORAL | @ 13:00:00

## 2021-03-15 MED ADMIN — oxyCODONE (ROXICODONE) immediate release tablet 10 mg: 10 mg | ORAL | @ 02:00:00 | Stop: 2021-03-21

## 2021-03-15 MED ADMIN — cetirizine (ZyrTEC) tablet 10 mg: 10 mg | ORAL | @ 13:00:00

## 2021-03-15 MED ADMIN — lamoTRIgine (LaMICtal) tablet 200 mg: 200 mg | ORAL | @ 02:00:00

## 2021-03-15 MED ADMIN — pediatric multivitamin-vit D3 1,500 unit-vit K 800 mcg (MVW COMPLETE FORMULATION) capsule: 2 | ORAL | @ 13:00:00

## 2021-03-15 MED ADMIN — atorvastatin (LIPITOR) tablet 20 mg: 20 mg | ORAL | @ 02:00:00

## 2021-03-15 MED ADMIN — insulin lispro (HumaLOG) injection 0-12 Units: 0-12 [IU] | SUBCUTANEOUS | @ 22:00:00

## 2021-03-15 MED ADMIN — acetaminophen (TYLENOL) tablet 650 mg: 650 mg | ORAL | @ 19:00:00

## 2021-03-15 MED ADMIN — oxyCODONE (ROXICODONE) immediate release tablet 10 mg: 10 mg | ORAL | @ 22:00:00 | Stop: 2021-03-21

## 2021-03-15 MED ADMIN — sodium chloride 7% nebulizer solution 4 mL: 4 mL | RESPIRATORY_TRACT | @ 01:00:00

## 2021-03-15 MED ADMIN — montelukast (SINGULAIR) tablet 10 mg: 10 mg | ORAL | @ 02:00:00

## 2021-03-15 MED ADMIN — gabapentin (NEURONTIN) capsule 600 mg: 600 mg | ORAL | @ 13:00:00

## 2021-03-15 MED ADMIN — pancrelipase (Lip-Prot-Amyl) (CREON) 24,000-76,000 -120,000 unit delayed release capsule 288,000 units of lipase: 288000 [IU] | ORAL | @ 14:00:00

## 2021-03-15 MED ADMIN — FLUoxetine (PROzac) capsule 60 mg: 60 mg | ORAL | @ 13:00:00

## 2021-03-15 MED ADMIN — fluticasone propionate (FLONASE) 50 mcg/actuation nasal spray 1 spray: 1 | NASAL | @ 14:00:00

## 2021-03-15 MED ADMIN — pancrelipase (Lip-Prot-Amyl) (CREON) 24,000-76,000 -120,000 unit delayed release capsule 288,000 units of lipase: 288000 [IU] | ORAL | @ 19:00:00

## 2021-03-15 MED ADMIN — tobramycin (NEBCIN) 260 mg in sodium chloride (NS) 0.9 % 100 mL IVPB: 260 mg | INTRAVENOUS | @ 21:00:00 | Stop: 2021-03-28

## 2021-03-15 MED ADMIN — oxyCODONE (ROXICODONE) immediate release tablet 5 mg: 5 mg | ORAL | @ 11:00:00 | Stop: 2021-03-21

## 2021-03-15 MED ADMIN — pediatric multivitamin-vit D3 1,500 unit-vit K 800 mcg (MVW COMPLETE FORMULATION) capsule: 2 | ORAL | @ 02:00:00

## 2021-03-15 MED ADMIN — melatonin tablet 10.5 mg: 10 mg | ORAL | @ 02:00:00

## 2021-03-15 MED ADMIN — pancrelipase (Lip-Prot-Amyl) (CREON) 24,000-76,000 -120,000 unit delayed release capsule 288,000 units of lipase: 288000 [IU] | ORAL | @ 23:00:00

## 2021-03-15 MED FILL — TRIKAFTA 100-50-75 MG (D)/150 MG (N) TABLETS: 28 days supply | Qty: 84 | Fill #2

## 2021-03-15 NOTE — Unmapped (Signed)
Problem: Adult Inpatient Plan of Care  Goal: Optimal Comfort and Wellbeing  Outcome: Ongoing - Unchanged  Goal: Readiness for Transition of Care  Outcome: Ongoing - Unchanged     Problem: Adult Inpatient Plan of Care  Goal: Plan of Care Review  Outcome: Progressing  Goal: Patient-Specific Goal (Individualized)  Outcome: Progressing  Goal: Absence of Hospital-Acquired Illness or Injury  Outcome: Progressing  Goal: Rounds/Family Conference  Outcome: Progressing

## 2021-03-15 NOTE — Unmapped (Signed)
[place] Hospitalist Daily Progress Note     LOS: 1 day     Assessment/Plan:  Principal Problem:    Cystic fibrosis with pulmonary exacerbation (CMS-HCC)  Active Problems:    Essential hypertension    Depressive disorder    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)    Pancreatic insufficiency due to cystic fibrosis (CMS-HCC)    History of Mycobacterium abscessus infection    Chronic deep vein thrombosis (DVT) of lower extremity (CMS-HCC)    Restless leg syndrome    Degeneration of lumbar intervertebral disc  Resolved Problems:    * No resolved hospital problems. *         Christian Young is a 35 y.o. male who presented to [facility] with Cystic fibrosis with pulmonary exacerbation (CMS-HCC).    Acute hypoxemic respiratory failure due to COVID-19 infection with Pneumonia  ID consulted yesterday. Per ID this is likely tail end of infection, but will treat for 3 days of Remdesivir given underlying lung disease.         Date of symptom onset: Approximately 1 week prior to admission.         Date of positive SARS-CoV-2 PCR: 03/15/2021        Exposure: Unknown, while wife is an EMT, she is asymptomatic.        Vaccination status:   COVID-19 Vaccine History     Name Date Dose Route    COVID-19 VACC,MRNA,(PFIZER)(PF)(IM) 07/11/2020  1:46 PM 0.3 mL Intramuscular    Site: Right deltoid    COVID-19 VACC,MRNA,(PFIZER)(PF)(IM) 11/21/2019 0.3 mL --    External: Historical information - from other registry    COVID-19 VACC,MRNA,(PFIZER)(PF)(IM) 10/31/2019 0.3 mL Intramuscular    Site: Left deltoid    External: Historical information - from other registry              Therapy: Remdesivir  for 3 days.        Oxygen: Room air   - Maintain SpO2 92-96%   - If indicated, Daily labs: CBC, BMP (add hepatic fx panel if on Remdesevir)  - Monitor q48h-72h CRP, D-dimer, LDH      Hypercoagulable state in the setting of COVID-19:   Patient is at increased risk for thrombosis in the setting of acute COVID-19 infection, already on DOAC therapy given history of DVT.   - Patient is not on oxygen and should receive standard VTE pharmacologic prophylaxis.  - Xarelto 20 qd, Home dose and frequency    Advanced care planning  Goals of Care OR Palliative care consult (Page [phone]) if clinical worsening and escalation to ICU expected. Co-morbidities that may increase risk of critical illness include advanced age, obesity, HTN, DM, conditions causing chronic organ failure, or immunocompromising conditions.   - Code status: FULL CODE  - Healthcare proxy: Wife     CF with exacerbation:   Presumably,  acute exacerbation is due to CF alone, and not COVID.  BC remain negative. AFB sputum smear and culture results pending.   - Pulmonology Medicine following    - FU BC,   - FU CF sputum culture, AFB culture (has previously grown NTM)  - audiology consult. Has a history of tinnitus with tobramycin)  - Zosyn (4.5 mg q6 hours) and IV tobramycin pharmacy to dose  - PFTs later today or tomorrow  - Airway clearance with vest and aerobika TID, albuterol  TID, HTS TID, pulmozyme, symbicort formulary sub  - f/u INR: if PT/INR normal on admission start 5 mg  PO vitamin K twice a week while on IV antibiotics; if PT/INR elevated on admission start 5 mg PO vitamin K daily and recheck in 5 days - if normalized switch to twice a week while on IV antibiotics, if still elevated continue daily while on IV antibiotics  - cont home enzymes and vitamins  - cont home trikafta (pt brought home supply with him)      Other Medical Issues:  CFRD on insulin:   Follows with [facility]ology, has CGM device. Glycemic control acceptable yesterday.   - Lantus 50 qhs  - Lispro 30 units TID AC  - SSI Standard Scale   ??  H/o DVT:  - Xarelto 20 qd  ??  Depression:   - Fluoxetine, Melatonin, Trazodone  ??  HTN:  - Lisinopril 10 qd  ??  Low back pain   - Robaxan prn   - Oxycodone 5-10 prn  - Gabapentin 600 TID    ??  RLS:   - Pramipexole      DVT prophylaxis. Xarelto      Disposition: Floor.  Probable DC home with home health at some point.     Please page the [facility]) pager at [phone] with questions.      Pending labs:   Pending Labs     Order Current Status    AFB SMEAR In process    AFB culture In process    CF Sputum/ CF Sinus Culture In process    Blood Culture #1 Preliminary result    Blood Culture #2 Preliminary result          Subjective:   No major issues since yesterday. Better rested this morning. No complains of pain during encounter.     Objective:   Physical Exam:    GEN: NAD, supine in bed, watching TV.    RESPIRATORY: Breathing pattern was normal and the chest moved symmetrically.  Lung sounds were normal and there were no adventitious sounds.    CARDIOVASCULAR: Heart rate and rhythm were normal.  S1 and S2 were normal and there were no extra sounds or murmurs. Jugular venous pressure was normal.  There was no peripheral edema.  ABDOMEN: The abdomen was obese in contour.  Bowel sounds were present.    Palpation detected no tenderness.   NEUROLOGIC: Mental status was normal.  The patient was normally coordinated in the arms.   PSYCHIATRIC: The patient was oriented to person, place, time, and circumstance. Speech was normal. Mood and affect were normal     Vital signs in last 24 hours:  Temp:  [36.7 ??C (98.1 ??F)-37 ??C (98.6 ??F)] 36.7 ??C (98.1 ??F)  Heart Rate:  [89-102] 89  Resp:  [18] 18  BP: (120-122)/(76-82) 122/77  MAP (mmHg):  [88-93] 92  SpO2:  [95 %-98 %] 97 %    Intake/Output last 24 hours:  No intake or output data in the 24 hours ending 03/15/21 0734    Medications:   Scheduled Meds:  ??? albuterol  2.5 mg Nebulization TID   ??? atorvastatin  20 mg Oral Nightly   ??? cetirizine  10 mg Oral Daily   ??? dornase alfa  2.5 mg Inhalation Daily   ??? elexacaftor-tezacaftor-ivacaft  2 tablet Oral Q AM    And   ??? elexacaftor-tezacaftor-ivacaft  1 tablet Oral Q PM   ??? FLUoxetine  60 mg Oral Daily   ??? fluticasone furoate-vilanteroL  1 puff Inhalation Daily (RT)   ??? fluticasone propionate  1  spray Each Nare Daily   ??? gabapentin  600 mg Oral TID   ??? insulin glargine  50 Units Subcutaneous Nightly   ??? insulin lispro  0-12 Units Subcutaneous ACHS   ??? insulin lispro  30 Units Subcutaneous TID AC   ??? lamoTRIgine  200 mg Oral BID   ??? lisinopriL  10 mg Oral Daily   ??? melatonin  10.5 mg Oral Nightly   ??? montelukast  10 mg Oral Nightly   ??? pancrelipase (Lip-Prot-Amyl)  288,000 units of lipase Oral 3xd Meals   ??? pantoprazole  20 mg Oral Daily   ??? MVW Complete (pediatric multivit 61-D3-vit K)  2 capsule Oral BID   ??? piperacillin-tazobactam (ZOSYN) IV (intermittent)  4.5 g Intravenous Q6H SCH   ??? polyethylene glycol  17 g Oral Daily   ??? pramipexole  0.25 mg Oral Q PM   ??? remdesivir  100 mg Intravenous Daily   ??? rivaroxaban  20 mg Oral Daily   ??? senna  2 tablet Oral Nightly   ??? sodium chloride 7%  4 mL Nebulization TID   ??? tobramycin (NEBCIN) IVPB (conventional dosing)  260 mg Intravenous Q12H   ??? traZODone  200 mg Oral Nightly     Continuous Infusions:  ??? sodium chloride 30 mL/hr (03/14/21 0508)       D Linford Arnold MD  [facility] Service.

## 2021-03-15 NOTE — Unmapped (Signed)
COVID-19 INFECTIOUS DISEASES CONSULT NOTE      Christian Young is a 35 y.o. male  being seen in consultation at the request of Hubbard Robinson, MD for evaluation of discordant tests for COVID-19.    Assessment/Recommendations:  35 y.o. male w/ PMH of cystic fibrosis who presents with concern for exacerbation. On presentation underwent 2 COVID tests, initially tested with COVID/RSV/Influenza study which resulted negative. Subsequently tested with respiratory pathogen panel, which resulted positive for COVID. These tests were reviewed with the microbiology lab and agreed that 1 of 2 things resulted in the discordant results. Either the sampling was poor on the initial test and more adequate on the subsequent respiratory pathogen panel or the positivity was extremely low and only picked up on the respiratory pathogen panel because of its high sensitivity. To determine if sampling was potentially the cause or not, the second sample from the RPP was run on the COVID/RSV/Influenza platform and also resulted negative, indicating that the acutal cause of the discordance was due to very low level positivity with presumed cycle times of > 40. This indicates that most likely Mr. Hardcastle has resolved/resolving COVID infection and that his current symptoms are either the tail end of the infection or are due to a CF exacerbation. Either way given his significant risk from COVID due to his underlying lung disease we will plan to give a 3 day course of prophylactic remdesivir to hopefully prevent progression to more severe infection from COVID.    ID Problem List:    1. COVID-19 Pneumonia  Date of symptom onset: ~03/06/21  Date of positive SARS-CoV-2 PCR: 03/13/21  Currently on room air    Based on current guidelines (found on the [facility] COVID-19 Clinical Care Resources page, under Treatment Guidelines), this patient does meet [place] criteria for the following COVID-directed therapies:    [ ]  Dexamethasone  [x]  Remdesivir  [ ]  Monoclonal antibody therapy for COVID-19  [ ]  Tocilizumab    Recommendation:  - Continue supportive care. See the ICU and non-ICU COVID-19 Care Cards for suggested guidance [facility] COVID-19 Clinical Care Resources page).  - Anticoagulate as directed by [facility]'s COVID Anticoagulation Algorithm.    - Treat with prophylactic regimen of remdesivir (3 day duration to prevent progression of COVID). To initiate, please:  1. Use the REMDESIVIR ORDER PANEL (WT BASED) Epic order set to start remdesivir 200mg  IV x1 on day 1, followed by 100mg  IV qday on days 2-3.   2. Monitor serum Cr and LFTs during therapy. Remdesivir should be stopped if the ALT exceeds 10x the upper limit of normal.    I discussed the care with the primary team and reviewed labs, microbiology, radiology, and notes. Chart review, discussion with team, review of radiology and extensive discussions with microbiology over the discordant COVID tests took 75 minutes.    The COVID ID Consult Service will sign off now.     Tora Duck, MD  [place] Division of Infectious Diseases  COVID-ID Consult pager: [phone]    --------------------------------------------------------------------------------------------------------------  History of Present Illness:    Source of information includes:  Review of medical records, discussion with patient, discussion with treating providers.    Christian Young is a 35 y.o. male with history of cystic fibrosis, DM2, DVT, HTN, depression who presented to [facility] on 8/1 with complaint of worsening respiratory status. He reports that symptoms began roughly 1 week prior to presentation. Initially developed fatigue and worsening sinus symptoms, characterized as a headache and  ear pain. These symptoms were consistent with his typical CF exacerbations. Subsequently he developed worsening dyspnea, chest pain, increased mucous production and fever. Performed home spirometry testing which showed decrease in his FEV1 from baseline of around 72% to 65%. He contacted his nurse navigator in the pulmonology division who ultimately recommended he present to the ER for evaluation.     In the ER he was found to be afebrile and hemodynamically stable on presentation on 8/1. Initially underwent COVID testing with rapid COVID/RSV/Influenza which was negative. Decision was made to subsequently test a second time, this time with respiratory pathogen panel, which resulted positive for COVID.       Allergies:  Aztreonam, [place] [aztreonam lysine], Cefepime, Other, Slo-bid 100, Tobramycin, and Banana      Medications:   Current antimicrobials:  - Zosyn  - Tobramycin    Previous antimicrobials:  - None    Current/Prior immunomodulators:  - None    Other medications reviewed.   Current Medications as of 03/14/2021  Scheduled  PRN   albuterol, 2.5 mg, TID  atorvastatin, 20 mg, Nightly  cetirizine, 10 mg, Daily  dornase alfa, 2.5 mg, Daily  elexacaftor-tezacaftor-ivacaft, 2 tablet, Q AM   And  elexacaftor-tezacaftor-ivacaft, 1 tablet, Q PM  FLUoxetine, 60 mg, Daily  fluticasone furoate-vilanteroL, 1 puff, Daily (RT)  fluticasone propionate, 1 spray, Daily  gabapentin, 600 mg, TID  insulin glargine, 50 Units, Nightly  insulin lispro, 0-12 Units, ACHS  insulin lispro, 30 Units, TID AC  lamoTRIgine, 200 mg, BID  lisinopriL, 10 mg, Daily  melatonin, 10.5 mg, Nightly  montelukast, 10 mg, Nightly  pancrelipase (Lip-Prot-Amyl), 288,000 units of lipase, 3xd Meals  pantoprazole, 20 mg, Daily  MVW Complete (pediatric multivit 61-D3-vit K), 2 capsule, BID  piperacillin-tazobactam (ZOSYN) IV (intermittent), 4.5 g, Q6H SCH  polyethylene glycol, 17 g, Daily  pramipexole, 0.25 mg, Q PM  [START ON 03/15/2021] remdesivir, 100 mg, Daily  rivaroxaban, 20 mg, Daily  senna, 2 tablet, Nightly  sodium chloride 7%, 4 mL, TID  tobramycin (NEBCIN) IVPB (conventional dosing), 260 mg, Q12H  traZODone, 200 mg, Nightly      acetaminophen, 650 mg, Q4H PRN  dextrose in water, 12.5 g, Q10 Min PRN  methocarbamoL, 500 mg, TID PRN  ondansetron, 4 mg, Q8H PRN  oxyCODONE, 5 mg, Q4H PRN   Or  oxyCODONE, 10 mg, Q4H PRN  pancrelipase (Lip-Prot-Amyl), 4 capsule, With snacks         Medical History:  Past Medical History:   Diagnosis Date   ??? Anxiety    ??? Chronic pain disorder    ??? Cystic fibrosis (CMS-HCC)    ??? Depression    ??? Hypertension    ??? Lumbar radiculopathy 10/26/2020   ??? Nonproductive cough 04/05/2018       Surgical History:  Past Surgical History:   Procedure Laterality Date   ??? IR INSERT PORT AGE GREATER THAN 5 YRS  03/27/2019    IR INSERT PORT AGE GREATER THAN 5 YRS 03/27/2019 Rush Barer, MD IMG VIR [facility]   ??? PR REMOVAL OF LUNG,LOBECTOMY Right 03/29/2017    Procedure: REMOVAL OF LUNG, OTHER THAN PNEUMONECTOMY; SINGLE LOBE (LOBECTOMY);  Surgeon: Cherie Dark, MD;  Location: MAIN OR [facility];  Service: Thoracic       Social History:  Social History     Socioeconomic History   ??? Marital status: Married   Tobacco Use   ??? Smoking status: Former Smoker     Types: e-Cigarettes   ???  Smokeless tobacco: Never Used   Substance and Sexual Activity   ??? Alcohol use: Not Currently     Alcohol/week: 3.0 standard drinks     Types: 3 Glasses of wine per week   ??? Drug use: No   ??? Sexual activity: Not Currently     Social Determinants of Health     Financial Resource Strain: Low Risk    ??? Difficulty of Paying Living Expenses: Not very hard   Food Insecurity: No Food Insecurity   ??? Worried About Running Out of Food in the Last Year: Never true   ??? Ran Out of Food in the Last Year: Never true   Transportation Needs: No Transportation Needs   ??? Lack of Transportation (Medical): No   ??? Lack of Transportation (Non-Medical): No       Family History:  Family History   Problem Relation Age of Onset   ??? Bipolar disorder Mother    ??? Depression Mother        Review of Systems:  10 systems reviewed and negative except as per HPI.       Objective:   Vital Signs last 24 hours:  Body mass index is 33.2 kg/m??.  Temp: [36.5 ??C (97.7 ??F)-37 ??C (98.6 ??F)] 37 ??C (98.6 ??F)  Heart Rate:  [79-103] 93  SpO2 Pulse:  [82-91] 82  Resp:  [18-24] 18  BP: (114-153)/(74-104) 122/82  MAP (mmHg):  [89-97] 93  SpO2:  [96 %-98 %] 98 %  BMI (Calculated):  [33.2] 33.2    Physical Exam:  GENERAL: no acute distress  EYES: PERRL, sclera clear  CHEST: Crackles in bilateral lung fields, no increased work of breathing on room air  CV: RRR, S1S2, no m/r/g.  ABD: soft/NT/ND, +BS  EXT: WWP, no LE edema  SKIN: No rashes  NEURO: Alert and interactive, no focal deficits. Speaking full sentences.  PSYCH: appropriate affect    Devices/Lines:  Patient Lines/Drains/Airways Status     Active Active Lines, Drains, & Airways     Name Placement date Placement time Site Days    Port A Cath 05/30/17 Right Chest 05/30/17  1746  Chest  1384    Peripheral IV 03/14/21 Left;Posterior Forearm 03/14/21  0040  Forearm  less than 1              Patient Lines/Drains/Airways Status     Active Wounds     Name Placement date Placement time Site Days    Surgical Site 03/29/17 Chest Right 03/29/17  1346  -- 1446    Surgical Site Axilla Right --  --  -- --                COVID-specific labs  Lab Results   Component Value Date    CRP <4.0 03/14/2021    CRP <4.0 11/24/2020    CRP <4.0 06/28/2020    CRP 3 01/18/2020    CRP 24.9 (H) 08/15/2018    CRP 9.8 08/19/2017     No results found for: FERRITIN  No results found for: LDH  Lab Results   Component Value Date    DDIMER 167 03/18/2017     No results found for: FIBRINOGEN    Labs:    Lab Results   Component Value Date    WBC 8.4 03/14/2021    WBC 7.5 03/14/2021    Absolute Neutrophils 4.1 03/14/2021    Absolute Lymphocytes 2.5 03/14/2021    Absolute Eosinophils 0.2 03/14/2021    HGB 13.3  03/14/2021    HCT 39.2 03/14/2021    Platelet 227 03/14/2021    Platelet 276 03/14/2021    Sodium 136 03/14/2021    Sodium 135 01/18/2020    Potassium 4.1 03/14/2021    Potassium 4.7 01/18/2020    BUN 16 03/14/2021    BUN 13 01/18/2020    Creatinine 0.79 03/14/2021    Creatinine 0.68 03/14/2021    Creatinine 0.89 01/18/2020    Glucose 113 03/14/2021    Magnesium 1.9 12/09/2020    Calcium 8.7 03/14/2021    Calcium 9.4 01/18/2020    Phosphorus 3.9 08/12/2018    Albumin 3.9 03/14/2021    Total Bilirubin 0.3 03/14/2021    Total Bilirubin 0.6 01/18/2020    AST 19 03/14/2021    AST 26 01/18/2020    ALT 29 03/14/2021    ALT 31 01/18/2020    Alkaline Phosphatase 79 03/14/2021    Alkaline Phosphatase 122 (H) 01/18/2020    INR 1.09 03/14/2021    INR 1.1 01/18/2020    Creatine Kinase, Total 236.0 06/18/2017    Sed Rate 8 03/14/2021    Sed Rate 10 11/24/2020    CRP <4.0 03/14/2021    CRP <4.0 11/24/2020    CRP 3 01/18/2020     Most recent UA:  Lab Results   Component Value Date    Squam Epithel, UA <1 05/30/2017    Bacteria, UA None Seen 05/30/2017    Leukocyte Esterase, UA Negative 05/30/2017    Nitrite, UA Negative 05/30/2017    Protein, UA Negative 05/30/2017    Blood, UA Negative 05/30/2017    pH, UA 5.0 05/30/2017     Drug monitoring:  Lab Results   Component Value Date    Vancomycin Tr <3.0 (L) 12/06/2020    Amikacin Rm 2.7 04/04/2017    Amikacin Rm 17.8 04/04/2017     Microbiology:    Microbiology Results (last day)     Procedure Component Value Date/Time Date/Time    AFB culture [[id number]] Collected: 03/14/21 1649    Lab Status: In process Specimen: Sputum, CF from SPUTUM EXPECTORATED Updated: 03/14/21 1654    CF Sputum/ CF Sinus Culture [[id number]] Collected: 03/14/21 1649    Lab Status: In process Specimen: Sputum, CF Updated: 03/14/21 1654    Respiratory Pathogen Panel with COVID-19 [[id number]]  (Abnormal) Collected: 03/14/21 0209    Lab Status: Final result Specimen: Nasopharyngeal Swab Updated: 03/14/21 0414     Adenovirus Not Detected     Coronavirus HKU1 Not Detected     Coronavirus NL63 Not Detected     Coronavirus 229E Not Detected     Coronavirus OC43 PCR Not Detected     Metapneumovirus Not Detected     Rhinovirus/Enterovirus Not Detected     Influenza A Not Detected     Influenza B Not Detected     Parainfluenza 1 Not Detected     Parainfluenza 2 Not Detected     Parainfluenza 3 Not Detected     Parainfluenza 4 Not Detected     RSV Not Detected     Bordetella pertussis Not Detected     Comment: If B. pertussis/parapertussis infection is suspected, the Bordetella pertussis/parapertussis Qualitative PCR test should be ordered.        Bordetella parapertussis Not Detected     Chlamydophila (Chlamydia) pneumoniae Not Detected     Mycoplasma pneumoniae Not Detected     SARS-CoV-2 PCR Detected    Narrative:      This result was obtained  using the FDA-cleared BioFire Respiratory 2.1 Panel. Performance characteristics have been established and verified by the Clinical Molecular Microbiology Laboratory, [facility]. This assay does not distinguish between rhinovirus and enterovirus. Lower respiratory specimens will not be tested for Bordetella pertussis/parapertussis. For nasopharyngeal swabs, cross-reactivity may occur between B. pertussis and non-pertussis Bordetella species. All positive B. pertussis results will be automatically confirmed using our in-house PCR assay. Ct (cycle threshold) values for SARS-CoV-2 are not available for this test.    Blood Culture #2 [[id number]] Collected: 03/14/21 0054    Lab Status: In process Specimen: Blood from 1 Peripheral Draw Updated: 03/14/21 0057    Blood Culture #1 [[id number]] Collected: 03/14/21 0040    Lab Status: In process Specimen: Blood from 1 Peripheral Draw Updated: 03/14/21 0057    RAPID INFLUENZA/RSV/COVID PCR [[id number]]  (Normal) Collected: 03/13/21 2234    Lab Status: Final result Specimen: Nasopharyngeal Swab Updated: 03/13/21 2322     SARS-CoV-2 PCR Negative     Influenza A Negative     Influenza B Negative     RSV Negative    Narrative:      This test was performed using the Cepheid Xpert Xpress SARS-CoV-2/Flu/RSV plus assay, which has been validated by the CLIA-certified, CAP-inspected [facility] Clinical Laboratory. FDA has granted Emergency Use Authorization for this test. Negative results do not preclude infection and should be interpreted along with clinical observations, patient history, and epidemiological information. Information for providers and patients can be found here: [email or web address]          Studies (images reviewed, radiologist's reports as follows):   XR Chest 2 views: 03/13/2021  FINDINGS: There are lung volumes. There is blunting of the bilateral costophrenic angles, similar to the prior exam. There is bibasilar airspace opacity, similar to the prior exam. There is a left-sided chest port catheter. The cardiomediastinal silhouette appears similar to the prior exam. No acute osseous abnormality is identified.   IMPRESSION: Low lung volumes with bibasilar airspace opacity likely atelectasis; however, superimposed infection or aspiration cannot be excluded. Clinical correlation is recommended.

## 2021-03-15 NOTE — Unmapped (Signed)
Care Management  Initial Transition Planning Assessment              General  Care Manager assessed the patient by : Telephone conversation with patient, Medical record review, Discussion with Clinical Care team  Orientation Level: Oriented X4  Functional level prior to admission: Independent  Reason for referral: Discharge Planning    Contact/Decision Maker  Extended Emergency Contact Information  Primary Emergency Contact: Carmin [place]  Mobile Phone: [phone]  Relation: Spouse  Preferred language: ENGLISH  Interpreter needed? No    Legal Next of Kin / Guardian / POA / Advance Directives     HCDM (patient stated preference): Griffin,Alyson - Spouse - [phone]    Advance Directive (Medical Treatment)  Does patient have an advance directive covering medical treatment?: Patient does not have advance directive covering medical treatment.  Reason patient does not have an advance directive covering medical treatment:: Patient does not wish to complete one at this time.    Health Care Decision Maker [HCDM] (Medical & Mental Health Treatment)  Healthcare Decision Maker: Patient does not wish to appoint a Health Care Decision Maker at this time  Information offered on HCDM, Medical & Mental Health advance directives:: Patient declined information.    Advance Directive (Mental Health Treatment)  Does patient have an advance directive covering mental health treatment?: Patient does not have advance directive covering mental health treatment.  Reason patient does not have an advance directive covering mental health treatment:: Patient does not wish to complete one at this time.    Readmission Information    Have you been hospitalized in the last 30 days?: No     Did the following happen with your discharge?        Patient Information  Lives with: Spouse/significant other    Type of Residence: Private residence        Location/Detail: [address] [place] [address] phone [phone] [facility]    Support Systems/Concerns: Spouse    Responsibilities/Dependents at home?: No    Home Care services in place prior to admission?: No (if needed uses [facility])          Outpatient/Community Resources in place prior to admission: Clinic  Agency detail (Name/Phone #): PCP [facility]    Equipment Currently Used at Home: respiratory supplies  Current HME Agency (Name/Phone #): pt has a nebulizer and a chest vest-can't remember name of company    Currently receiving outpatient dialysis?: No       Financial Information       Need for financial assistance?: No       Social Determinants of Health  Social Determinants of Health were addressed in provider documentation.  Please refer to patient history.  Social Determinants of Health     Tobacco Use: Medium Risk   ??? Smoking Tobacco Use: Former Smoker   ??? Smokeless Tobacco Use: Never Used   Alcohol Use: Not on file   Financial Resource Strain: Low Risk    ??? Difficulty of Paying Living Expenses: Not hard at all   Food Insecurity: No Food Insecurity   ??? Worried About Programme researcher, broadcasting/film/video in the Last Year: Never true   ??? Ran Out of Food in the Last Year: Never true   Transportation Needs: No Transportation Needs   ??? Lack of Transportation (Medical): No   ??? Lack of Transportation (Non-Medical): No   Physical Activity: Not on file   Stress: Not on file  Social Connections: Not on file   Intimate Partner Violence: Not on file   Depression: Not on file   Housing/Utilities: Low Risk    ??? Within the past 12 months, have you ever stayed: outside, in a car, in a tent, in an overnight shelter, or temporarily in someone else's home (i.e. couch-surfing)?: No   ??? Are you worried about losing your housing?: No   ??? Within the past 12 months, have you been unable to get utilities (heat, electricity) when it was really needed?: No   Substance Use: Not on file   Health Literacy: Low Risk    ??? : Never       Complex Discharge Information    Is patient identified as a difficult/complex discharge?: No      Interventions:     Discharge Needs Assessment  Concerns to be Addressed: adjustment to diagnosis/illness, decision-making, discharge planning, medication    Clinical Risk Factors: > 65, Multiple Diagnoses (Chronic), Visual or Hearing Impaired    Barriers to taking medications: No    Prior overnight hospital stay or ED visit in last 90 days: No       Anticipated Changes Related to Illness: none    Equipment Needed After Discharge: other (see comments) (TBD)    Discharge Facility/Level of Care Needs: other (see comments) (anticipate home with infusion)    Readmission  Risk of Unplanned Readmission Score: UNPLANNED READMISSION SCORE: 21.99%  Predictive Model Details          22% (Medium)  Factor Value    Calculated 03/14/2021 16:04 49% Number of active Rx orders 78    [place] Risk of Unplanned Readmission Model 10% Number of ED visits in last six months 2     9% Number of hospitalizations in last year 2     8% ECG/EKG order present in last 6 months     7% Encounter of ten days or longer in last year present     6% Imaging order present in last 6 months     4% Active anticoagulant Rx order present     2% Age 12     2% Charlson Comorbidity Index 2     2% Future appointment scheduled     1% Active ulcer medication Rx order present     1% Current length of stay 0.571 days      Readmitted Within the Last 30 Days? (No if blank)   Patient at risk for readmission?: Yes    Discharge Plan  Screen findings are: Discharge planning needs identified or anticipated (Comment). (Will arrange any needed services identified.)    Expected Discharge Date:     Expected Transfer from Critical Care:      Quality data for continuing care services shared with patient and/or representative?: Yes  Patient and/or family were provided with choice of facilities / services that are available and appropriate to meet post hospital care needs?: Yes   List choices in order highest to lowest preferred, if applicable. : If infusion needed-pt prefers [facility]    Initial Assessment complete?: Yes

## 2021-03-15 NOTE — Unmapped (Signed)
PULMONARY CONSULT  NOTE      Patient: Christian Young(12/20/1985)  Reason for consultation: [name] is a 35 y.o. male who is seen in consultation at the request of Christian Robinson, MD for comprehensive evaluation of COVID in CF exacerbation.    Assessment and Recommendations:      Principal Problem:    Cystic fibrosis with pulmonary exacerbation (CMS-HCC)  Active Problems:    Essential hypertension    Depressive disorder    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)    Pancreatic insufficiency due to cystic fibrosis (CMS-HCC)    History of Mycobacterium abscessus infection    Chronic deep vein thrombosis (DVT) of lower extremity (CMS-HCC)    Restless leg syndrome    Degeneration of lumbar intervertebral disc  Resolved Problems:    * No resolved hospital problems. *    Christian Young??is a 35 y.o.??male??with PMHx of CF who??presented to [facility] with??Cystic fibrosis with pulmonary exacerbation (CMS-HCC) secondary to COVID.  ??  1. Acute exacerbation of CF bronchiectasis with COVID:   Agree with treatment with Remdesivir per ID recs, appreciate. He is not hypoxemic and overall does not have as much cough/sputum as usual. Plan for sputum culture and see how he does over the next few days to determine how much is COVID related vs some bacterial infection. Prior MSSA and 2 variants of PsA (one pan resistant and one with intermediate resistance).  For now continue Zosyn??4.5 mg q6h, tobramycin??IV, pharmacy to dose, does BID dosing. Expect he will need 10 days. CXR today for the pain.  - Continue Trikafta (non-formulary), Breo (formulary alternative for Symbicort) and Pulmozyme.   - Airway clearance with albuterol TID, 7% hypertonic saline, chest vest, and aerobika  ??  2. CFRD:   - Continue current insulin regimen.   ??    We appreciate the opportunity to assist in the care of this patient.  Please page [phone] with any questions.    Pablo Ledger, MD    Subjective:      History of Present Illness:  [name] is a 35 y.o. male admitted for cystic fibrosis exacerbation. He has a history of CF (W102V and [id number] insertion) on trikafta, CFRD on insulin, history of DVT (on xarelto currently), with recent baseline lung function of FEV1 ~72% .  ??  Last week started feeling fatigued with sinus symptoms which is typical of his exacerbations but then started having fevers which is unusual. He home COVID tested several days ago and was negative. He then presented to the ED last night, was COVID positive on the RPR (rapid was negative first).     He has been having chest pain - typical during exacerbations but also whole body aches and pains which is atypical for him.     8/3: Increased sharp left sided chest pain. Otherwise feels very fatigued.   ??    Review of Systems: A comprehensive review of systems was performed and was negative except as above in HPI  Past Medical History:   Diagnosis Date   ??? Anxiety    ??? Chronic pain disorder    ??? Cystic fibrosis (CMS-HCC)    ??? Depression    ??? Hypertension    ??? Lumbar radiculopathy 10/26/2020   ??? Nonproductive cough 04/05/2018     Past Surgical History:   Procedure Laterality Date   ??? IR INSERT PORT AGE GREATER THAN 5 YRS  03/27/2019    IR INSERT PORT AGE GREATER THAN 5  YRS 03/27/2019 Rush Barer, MD IMG VIR [facility]   ??? PR REMOVAL OF LUNG,LOBECTOMY Right 03/29/2017    Procedure: REMOVAL OF LUNG, OTHER THAN PNEUMONECTOMY; SINGLE LOBE (LOBECTOMY);  Surgeon: Cherie Dark, MD;  Location: MAIN OR [facility];  Service: Thoracic     Medications reviewed in Epic  Allergies as of 03/13/2021 - Reviewed 03/13/2021   Allergen Reaction Noted   ??? Aztreonam Anaphylaxis, Hives, Nausea And Vomiting, and Rash 09/06/2015   ??? [place] [aztreonam lysine] Anaphylaxis 12/27/2016   ??? Cefepime Itching, Nausea Only, and Other (See Comments) 09/06/2015   ??? Other Anaphylaxis and Other (See Comments) 09/06/2015   ??? Slo-bid 100 Anaphylaxis 06/20/2017   ??? Tobramycin Tinnitus 09/06/2015   ??? Banana Itching and Nausea And Vomiting 07/29/2016     Family History   Problem Relation Age of Onset   ??? Bipolar disorder Mother    ??? Depression Mother      Social History     Tobacco Use   ??? Smoking status: Former Smoker     Types: e-Cigarettes   ??? Smokeless tobacco: Never Used   Substance Use Topics   ??? Alcohol use: Not Currently     Alcohol/week: 3.0 standard drinks     Types: 3 Glasses of wine per week        Objective:      Physical Exam:  Vitals:    03/14/21 0913 03/14/21 2009 03/15/21 0545 03/15/21 0918   BP: 122/82 120/76 122/77 122/72   Pulse: 93 102 89    Resp: 18 18 18     Temp: 37 ??C (98.6 ??F) 36.9 ??C (98.4 ??F) 36.7 ??C (98.1 ??F)    TempSrc: Oral Temporal Oral    SpO2: 98% 95% 97%    Weight:       Height:         General: NAD  Eyes: Anicteric sclera  ENT:  Mucous membranes moist and intact.  Lymph: No cervical or supraclavicular adenopathy.  Lungs: Good air movement bilaterally. No wheezes or crackles.  Cardiovascular: Regular rate and rhythm.  Abdomen: Soft, non-tender, not distended, bowel sounds are normal  Musculoskeletal: No synovitis.  Skin: No rashes or lesions.  Neuro: No focal neurological deficits.    Malnutrition Assessment by RD:          Diagnostic Review:   All labs and images were personally reviewed.

## 2021-03-15 NOTE — Unmapped (Signed)
[facility] shared with the team [name]'s Trikafta fill history. Per the notes, last 28 day Trikafta fill was April 26th. However, there is now documentation of a shipment going out today, August 3rd.     Will route note as an FYI to the team.     [name]. [name], RN  CF Nurse Coordinator   [phone]

## 2021-03-15 NOTE — Unmapped (Signed)
Pt axox4. C/o of pain this shift, medicated x 2 with fair effect. Personal glucose monitor placed on left arm. Personal medication in med room. Independent in room. No there c/o this shift.

## 2021-03-16 LAB — BASIC METABOLIC PANEL
ANION GAP: 8 mmol/L (ref 5–14)
BLOOD UREA NITROGEN: 16 mg/dL (ref 9–23)
BUN / CREAT RATIO: 20
CALCIUM: 8.7 mg/dL (ref 8.7–10.4)
CHLORIDE: 99 mmol/L (ref 98–107)
CO2: 29.7 mmol/L (ref 20.0–31.0)
CREATININE: 0.82 mg/dL
EGFR CKD-EPI (2021) MALE: >90 mL/min/{1.73_m2} (ref >=60)
GLUCOSE RANDOM: 145 mg/dL (ref 70–179)
POTASSIUM: 4.3 mmol/L (ref 3.4–4.8)
SODIUM: 137 mmol/L (ref 135–145)

## 2021-03-16 LAB — CBC
HEMATOCRIT: 37.3 % — ABNORMAL LOW (ref 39.0–48.0)
HEMOGLOBIN: 12.6 g/dL — ABNORMAL LOW (ref 12.9–16.5)
MEAN CORPUSCULAR HEMOGLOBIN CONC: 33.8 g/dL (ref 32.0–36.0)
MEAN CORPUSCULAR HEMOGLOBIN: 27.7 pg (ref 25.9–32.4)
MEAN CORPUSCULAR VOLUME: 82 fL (ref 77.6–95.7)
MEAN PLATELET VOLUME: 7.5 fL (ref 6.8–10.7)
PLATELET COUNT: 233 10*9/L (ref 150–450)
RED BLOOD CELL COUNT: 4.55 10*12/L (ref 4.26–5.60)
RED CELL DISTRIBUTION WIDTH: 15.7 % — ABNORMAL HIGH (ref 12.2–15.2)
WBC ADJUSTED: 4 10*9/L (ref 3.6–11.2)

## 2021-03-16 LAB — HIGH SENSITIVITY TROPONIN I - SINGLE: HIGH SENSITIVITY TROPONIN I: <3 ng/L (ref <=53)

## 2021-03-16 LAB — PROTIME-INR
INR: 1.27
PROTIME: 14.8 s — ABNORMAL HIGH (ref 10.3–13.4)

## 2021-03-16 MED ADMIN — cetirizine (ZyrTEC) tablet 10 mg: 10 mg | ORAL | @ 13:00:00

## 2021-03-16 MED ADMIN — polyethylene glycol (MIRALAX) packet 17 g: 17 g | ORAL | @ 13:00:00

## 2021-03-16 MED ADMIN — sodium chloride 7% nebulizer solution 4 mL: 4 mL | RESPIRATORY_TRACT | @ 20:00:00

## 2021-03-16 MED ADMIN — remdesivir (VEKLURY) 100 mg in sodium chloride (NS) 0.9 % 295 mL IVPB: 100 mg | INTRAVENOUS | @ 13:00:00 | Stop: 2021-03-19

## 2021-03-16 MED ADMIN — traZODone (DESYREL) tablet 200 mg: 200 mg | ORAL | @ 02:00:00

## 2021-03-16 MED ADMIN — piperacillin-tazobactam (ZOSYN) IVPB (premix) 4.5 g: 4.5 g | INTRAVENOUS | @ 04:00:00 | Stop: 2021-03-28

## 2021-03-16 MED ADMIN — insulin lispro (HumaLOG) injection 30 Units: 30 [IU] | SUBCUTANEOUS | @ 23:00:00

## 2021-03-16 MED ADMIN — insulin lispro (HumaLOG) injection 30 Units: 30 [IU] | SUBCUTANEOUS | @ 18:00:00

## 2021-03-16 MED ADMIN — lisinopriL (PRINIVIL,ZESTRIL) tablet 10 mg: 10 mg | ORAL | @ 13:00:00

## 2021-03-16 MED ADMIN — ondansetron (ZOFRAN-ODT) disintegrating tablet 4 mg: 4 mg | ORAL | @ 02:00:00

## 2021-03-16 MED ADMIN — elexacaftor-tezacaftor-ivacaft (TRIKAFTA) one (1) BLUE tablet (PATIENT'S OWN SUPPLY): 1 | ORAL | @ 23:00:00

## 2021-03-16 MED ADMIN — elexacaftor-tezacaftor-ivacaft (TRIKAFTA) 2 ORANGE tablets (PATIENT'S OWN SUPPLY): 2 | ORAL | @ 13:00:00

## 2021-03-16 MED ADMIN — oxyCODONE (ROXICODONE) immediate release tablet 10 mg: 10 mg | ORAL | @ 10:00:00 | Stop: 2021-03-21

## 2021-03-16 MED ADMIN — sodium chloride 7% nebulizer solution 4 mL: 4 mL | RESPIRATORY_TRACT | @ 01:00:00

## 2021-03-16 MED ADMIN — dornase alfa (PULMOZYME) 1 mg/mL solution 2.5 mg: 2.5 mg | RESPIRATORY_TRACT | @ 14:00:00

## 2021-03-16 MED ADMIN — albuterol 2.5 mg /3 mL (0.083 %) nebulizer solution 2.5 mg: 2.5 mg | RESPIRATORY_TRACT | @ 01:00:00

## 2021-03-16 MED ADMIN — tobramycin (NEBCIN) 260 mg in sodium chloride (NS) 0.9 % 100 mL IVPB: 260 mg | INTRAVENOUS | @ 21:00:00 | Stop: 2021-03-28

## 2021-03-16 MED ADMIN — insulin lispro (HumaLOG) injection 0-12 Units: 0-12 [IU] | SUBCUTANEOUS | @ 23:00:00

## 2021-03-16 MED ADMIN — methocarbamoL (ROBAXIN) tablet 500 mg: 500 mg | ORAL | @ 02:00:00

## 2021-03-16 MED ADMIN — lamoTRIgine (LaMICtal) tablet 200 mg: 200 mg | ORAL | @ 02:00:00

## 2021-03-16 MED ADMIN — albuterol 2.5 mg /3 mL (0.083 %) nebulizer solution 2.5 mg: 2.5 mg | RESPIRATORY_TRACT | @ 14:00:00

## 2021-03-16 MED ADMIN — insulin lispro (HumaLOG) injection 30 Units: 30 [IU] | SUBCUTANEOUS | @ 13:00:00

## 2021-03-16 MED ADMIN — pancrelipase (Lip-Prot-Amyl) (CREON) 24,000-76,000 -120,000 unit delayed release capsule 288,000 units of lipase: 288000 [IU] | ORAL | @ 23:00:00

## 2021-03-16 MED ADMIN — gabapentin (NEURONTIN) capsule 600 mg: 600 mg | ORAL | @ 02:00:00

## 2021-03-16 MED ADMIN — melatonin tablet 10.5 mg: 10 mg | ORAL | @ 02:00:00

## 2021-03-16 MED ADMIN — oxyCODONE (ROXICODONE) immediate release tablet 10 mg: 10 mg | ORAL | @ 18:00:00 | Stop: 2021-03-21

## 2021-03-16 MED ADMIN — albuterol 2.5 mg /3 mL (0.083 %) nebulizer solution 2.5 mg: 2.5 mg | RESPIRATORY_TRACT | @ 20:00:00

## 2021-03-16 MED ADMIN — montelukast (SINGULAIR) tablet 10 mg: 10 mg | ORAL | @ 02:00:00

## 2021-03-16 MED ADMIN — FLUoxetine (PROzac) capsule 60 mg: 60 mg | ORAL | @ 13:00:00

## 2021-03-16 MED ADMIN — pediatric multivitamin-vit D3 1,500 unit-vit K 800 mcg (MVW COMPLETE FORMULATION) capsule: 2 | ORAL | @ 02:00:00

## 2021-03-16 MED ADMIN — rivaroxaban (XARELTO) tablet 20 mg: 20 mg | ORAL | @ 13:00:00

## 2021-03-16 MED ADMIN — pediatric multivitamin-vit D3 1,500 unit-vit K 800 mcg (MVW COMPLETE FORMULATION) capsule: 2 | ORAL | @ 13:00:00

## 2021-03-16 MED ADMIN — fluticasone furoate-vilanteroL (BREO ELLIPTA) 200-25 mcg/dose inhaler 1 puff: 1 | RESPIRATORY_TRACT | @ 13:00:00

## 2021-03-16 MED ADMIN — lamoTRIgine (LaMICtal) tablet 200 mg: 200 mg | ORAL | @ 13:00:00

## 2021-03-16 MED ADMIN — insulin lispro (HumaLOG) injection 0-12 Units: 0-12 [IU] | SUBCUTANEOUS | @ 13:00:00

## 2021-03-16 MED ADMIN — pancrelipase (Lip-Prot-Amyl) (CREON) 24,000-76,000 -120,000 unit delayed release capsule 288,000 units of lipase: 288000 [IU] | ORAL | @ 13:00:00

## 2021-03-16 MED ADMIN — piperacillin-tazobactam (ZOSYN) IVPB (premix) 4.5 g: 4.5 g | INTRAVENOUS | @ 16:00:00 | Stop: 2021-03-28

## 2021-03-16 MED ADMIN — tobramycin (NEBCIN) 260 mg in sodium chloride (NS) 0.9 % 100 mL IVPB: 260 mg | INTRAVENOUS | @ 10:00:00 | Stop: 2021-03-28

## 2021-03-16 MED ADMIN — pantoprazole (PROTONIX) EC tablet 20 mg: 20 mg | ORAL | @ 13:00:00

## 2021-03-16 MED ADMIN — acetaminophen (TYLENOL) tablet 650 mg: 650 mg | ORAL | @ 02:00:00

## 2021-03-16 MED ADMIN — piperacillin-tazobactam (ZOSYN) IVPB (premix) 4.5 g: 4.5 g | INTRAVENOUS | @ 10:00:00 | Stop: 2021-03-28

## 2021-03-16 MED ADMIN — pancrelipase (Lip-Prot-Amyl) (CREON) 24,000-76,000 -120,000 unit delayed release capsule 288,000 units of lipase: 288000 [IU] | ORAL | @ 18:00:00

## 2021-03-16 MED ADMIN — piperacillin-tazobactam (ZOSYN) IVPB (premix) 4.5 g: 4.5 g | INTRAVENOUS | @ 23:00:00 | Stop: 2021-03-28

## 2021-03-16 MED ADMIN — oxyCODONE (ROXICODONE) immediate release tablet 10 mg: 10 mg | ORAL | @ 02:00:00 | Stop: 2021-03-21

## 2021-03-16 MED ADMIN — atorvastatin (LIPITOR) tablet 20 mg: 20 mg | ORAL | @ 02:00:00

## 2021-03-16 MED ADMIN — gabapentin (NEURONTIN) capsule 600 mg: 600 mg | ORAL | @ 13:00:00

## 2021-03-16 MED ADMIN — fluticasone propionate (FLONASE) 50 mcg/actuation nasal spray 1 spray: 1 | NASAL | @ 13:00:00

## 2021-03-16 MED ADMIN — insulin glargine (LANTUS) injection 50 Units: 50 [IU] | SUBCUTANEOUS | @ 02:00:00

## 2021-03-16 MED ADMIN — gabapentin (NEURONTIN) capsule 600 mg: 600 mg | ORAL | @ 18:00:00

## 2021-03-16 MED ADMIN — pramipexole (MIRAPEX) tablet 0.25 mg: .25 mg | ORAL | @ 21:00:00

## 2021-03-16 MED ADMIN — sodium chloride 7% nebulizer solution 4 mL: 4 mL | RESPIRATORY_TRACT | @ 14:00:00

## 2021-03-16 MED ADMIN — senna (SENOKOT) tablet 2 tablet: 2 | ORAL | @ 02:00:00

## 2021-03-16 NOTE — Unmapped (Signed)
Problem: Adult Inpatient Plan of Care  Goal: Plan of Care Review  03/16/2021 0610 by Jimmie Molly, RRT  Outcome: Progressing  03/16/2021 0609 by Jimmie Molly, RRT  Outcome: Progressing     Problem: Infection (Cystic Fibrosis)  Goal: Absence of Infection Signs and Symptoms  Outcome: Progressing     Problem: Malabsorption (Cystic Fibrosis)  Goal: Optimal Bowel Elimination  Outcome: Progressing     Problem: Respiratory Compromise (Cystic Fibrosis)  Goal: Effective Oxygenation and Ventilation  Outcome: Progressing   Meds and airway clearance given as ordered.

## 2021-03-16 NOTE — Unmapped (Signed)
Aminoglycoside Therapeutic Monitoring Pharmacy Note    Christian Young is a 35 y.o. male continuing tobramycin. Date of therapy initiation: 03/14/21    Indication: CF exacerbation    Prior Dosing Information: Current regimen 260 mg IV every 12 hours      Goals:  Therapeutic Drug Levels  ?? Trough level: tobramycin <1 mg/L  ?? Peak level: tobramycin 10-14 mg/L    Additional Clinical Monitoring/Outcomes  Renal function, volume status (intake and output)    Results:   ?? Trough level: < 0.3 mg/L, drawn 30 minutes prior to next infusion  (Extrapolated trough: unable to assess true trough  mg/L)   ?? Peak level: 7.2 mg/L, drawn 1.5 hours from end of infusion  (Extrapolated Peak: 11.8 mg/L)    Wt Readings from Last 1 Encounters:   03/14/21 (!) 111.2 kg (245 lb 1.6 oz)     Lab Results   Component Value Date    CREATININE 0.85 03/15/2021       Pharmacokinetic Considerations and Significant Drug Interactions:  ??? Adult (calculated on 03/15/21): Vd = 29.8 L, ke = 0.33 hr-1  ??? Concurrent nephrotoxic meds: Zosyn     Assessment/Plan:  Recommendation(s)  ??? Continue current regimen of 260 mg every 12 hours  ??? Estimated peak and trough on recommended regimen: peak = 11.7 mg/L, trough = < 1 mg/L    Follow-up  ??? Level due: in 3-5 days  ??? A pharmacist will continue to monitor and order levels as appropriate    Please page service pharmacist with questions/clarifications.    Winferd Humphrey, PharmD

## 2021-03-16 NOTE — Unmapped (Signed)
Christian Young has been complaining of right chest pain and made physicians aware of the pain.  He is being treated with prescribed pain meds.  He has eaten well today and is in good spirits.

## 2021-03-16 NOTE — Unmapped (Signed)
PULMONARY CONSULT  NOTE      Patient: Christian Young(July 29, 1986)  Reason for consultation: Christian Young is a 35 y.o. male who is seen in consultation at the request of Hubbard Robinson, MD for comprehensive evaluation of COVID in CF exacerbation.    Assessment and Recommendations:      Principal Problem:    Cystic fibrosis with pulmonary exacerbation (CMS-HCC)  Active Problems:    Essential hypertension    Depressive disorder    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)    Pancreatic insufficiency due to cystic fibrosis (CMS-HCC)    History of Mycobacterium abscessus infection    Chronic deep vein thrombosis (DVT) of lower extremity (CMS-HCC)    Restless leg syndrome    Degeneration of lumbar intervertebral disc  Resolved Problems:    * No resolved hospital problems. *    Christian Youngis a 35 y.o.??male??with PMHx of CF who??presented to [facility] with??Cystic fibrosis with pulmonary exacerbation (CMS-HCC) secondary to COVID.  ??  1. Acute exacerbation of CF bronchiectasis with COVID:   Agree with treatment with Remdesivir per ID recs, appreciate. He is not hypoxemic and overall does not have as much cough/sputum as usual.  He feels unwell although sputum culture still pending. We will see how he does over the next few days to determine how much is COVID related vs some bacterial infection. Prior MSSA and 2 variants of PsA (one pan resistant and one with intermediate resistance).  For now continue Zosyn??4.5 mg q6h, tobramycin??IV, pharmacy to dose, does BID dosing. Expect he will need 10 days. CXR unchanged.   - Continue Trikafta (non-formulary), Breo (formulary alternative for Symbicort) and Pulmozyme.   - Airway clearance with albuterol TID, 7% hypertonic saline, chest vest, and aerobika  ??  2. CFRD:   - Continue current insulin regimen.   ??    We appreciate the opportunity to assist in the care of this patient.  Please page [phone] with any questions.    Pablo Ledger, MD    Subjective: History of Present Illness:  Christian Young is a 35 y.o. male admitted for cystic fibrosis exacerbation. He has a history of CF (Y782N and [id number] insertion) on trikafta, CFRD on insulin, history of DVT (on xarelto currently), with recent baseline lung function of FEV1 ~72% .  ??  Last week started feeling fatigued with sinus symptoms which is typical of his exacerbations but then started having fevers which is unusual. He home COVID tested several days ago and was negative. He then presented to the ED last night, was COVID positive on the RPR (rapid was negative first).     He has been having chest pain - typical during exacerbations but also whole body aches and pains which is atypical for him.     8/4: Still with lots of fatigue. Reports he does not feel he can go home to complete antibiotics.   ??    Review of Systems: A comprehensive review of systems was performed and was negative except as above in HPI  Past Medical History:   Diagnosis Date   ??? Anxiety    ??? Chronic pain disorder    ??? Cystic fibrosis (CMS-HCC)    ??? Depression    ??? Hypertension    ??? Lumbar radiculopathy 10/26/2020   ??? Nonproductive cough 04/05/2018     Past Surgical History:   Procedure Laterality Date   ??? IR INSERT PORT AGE GREATER THAN 5 YRS  03/27/2019    IR  INSERT PORT AGE GREATER THAN 5 YRS 03/27/2019 Rush Barer, MD IMG VIR [facility]   ??? PR REMOVAL OF LUNG,LOBECTOMY Right 03/29/2017    Procedure: REMOVAL OF LUNG, OTHER THAN PNEUMONECTOMY; SINGLE LOBE (LOBECTOMY);  Surgeon: Cherie Dark, MD;  Location: MAIN OR [facility];  Service: Thoracic     Medications reviewed in Epic  Allergies as of 03/13/2021 - Reviewed 03/13/2021   Allergen Reaction Noted   ??? Aztreonam Anaphylaxis, Hives, Nausea And Vomiting, and Rash 09/06/2015   ??? [place] [aztreonam lysine] Anaphylaxis 12/27/2016   ??? Cefepime Itching, Nausea Only, and Other (See Comments) 09/06/2015   ??? Other Anaphylaxis and Other (See Comments) 09/06/2015   ??? Slo-bid 100 Anaphylaxis 06/20/2017   ??? Tobramycin Tinnitus 09/06/2015   ??? Banana Itching and Nausea And Vomiting 07/29/2016     Family History   Problem Relation Age of Onset   ??? Bipolar disorder Mother    ??? Depression Mother      Social History     Tobacco Use   ??? Smoking status: Former Smoker     Types: e-Cigarettes   ??? Smokeless tobacco: Never Used   Substance Use Topics   ??? Alcohol use: Not Currently     Alcohol/week: 3.0 standard drinks     Types: 3 Glasses of wine per week        Objective:      Physical Exam:  Vitals:    03/15/21 1524 03/15/21 2052 03/16/21 0622 03/16/21 0847   BP: 132/82 128/71 107/70 133/82   Pulse: 104 86 84    Resp: 18 16 18     Temp: 37 ??C (98.6 ??F) 36.6 ??C (97.9 ??F) 36.6 ??C (97.9 ??F)    TempSrc: Oral Oral Oral    SpO2: 98% 97% 98%    Weight:       Height:         General: NAD  Eyes: Anicteric sclera  ENT:  Mucous membranes moist and intact.  Lymph: No cervical or supraclavicular adenopathy.  Lungs: Good air movement bilaterally. No wheezes or crackles.  Cardiovascular: Regular rate and rhythm.  Abdomen: Soft, non-tender, not distended, bowel sounds are normal  Musculoskeletal: No synovitis.  Skin: No rashes or lesions.  Neuro: No focal neurological deficits.    Malnutrition Assessment by RD:          Diagnostic Review:   All labs and images were personally reviewed.

## 2021-03-16 NOTE — Unmapped (Signed)
[place] Hospitalist Daily Progress Note     LOS: 2 days     Assessment/Plan:  Principal Problem:    Cystic fibrosis with pulmonary exacerbation (CMS-HCC)  Active Problems:    Essential hypertension    Depressive disorder    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)    Pancreatic insufficiency due to cystic fibrosis (CMS-HCC)    History of Mycobacterium abscessus infection    Chronic deep vein thrombosis (DVT) of lower extremity (CMS-HCC)    Restless leg syndrome    Degeneration of lumbar intervertebral disc  Resolved Problems:    * No resolved hospital problems. *         Christian Young is a 35 y.o. male who presented to [facility] with Cystic fibrosis with pulmonary exacerbation (CMS-HCC).    CF with exacerbation:   Exacerbation is due to CF alone, and not COVID.  BC negative. AFB sputum negative so far. CXR yesterday demonstrated no significant changes. Pulmonary exam unremarkable.    - Pulmonology Medicine following    - FU BC,   - FU CF sputum culture, AFB culture (has previously grown NTM)  - Audiology consult. Has a history of tinnitus with tobramycin)  - Zosyn (4.5 mg q6 hours) and IV tobramycin pharmacy to dose  - PFT - repeat testing per Pulmonary Medicine   - Airway clearance with vest and aerobika TID, albuterol  TID, HTS TID, pulmozyme, symbicort formulary sub  - f/u INR: if PT/INR normal on admission start 5 mg PO vitamin K twice a week while on IV antibiotics; if PT/INR elevated on admission start 5 mg PO vitamin K daily and recheck in 5 days - if normalized switch to twice a week while on IV antibiotics, if still elevated continue daily while on IV antibiotics  - cont home enzymes and vitamins  - cont home trikafta (pt brought home supply with him)      Acute hypoxemic respiratory failure due to COVID-19 infection with Pneumonia  ID consulted yesterday. Per ID this is likely tail end of infection, but will treat for 3 days of Remdesivir given underlying lung disease.         Date of symptom onset: Approximately 1 week prior to admission.         Date of positive SARS-CoV-2 PCR: 03/16/2021        Exposure: Unknown, while wife is an EMT, she is asymptomatic.        Vaccination status:   COVID-19 Vaccine History     Name Date Dose Route    COVID-19 VACC,MRNA,(PFIZER)(PF)(IM) 07/11/2020  1:46 PM 0.3 mL Intramuscular    Site: Right deltoid    COVID-19 VACC,MRNA,(PFIZER)(PF)(IM) 11/21/2019 0.3 mL --    External: Historical information - from other registry    COVID-19 VACC,MRNA,(PFIZER)(PF)(IM) 10/31/2019 0.3 mL Intramuscular    Site: Left deltoid    External: Historical information - from other registry              Therapy: Remdesivir  for 3 days.        Oxygen: Room air   - Maintain SpO2 92-96%   - If indicated, Daily labs: CBC, BMP (add hepatic fx panel if on Remdesevir)  - Monitor q48h-72h CRP, D-dimer, LDH      Hypercoagulable state in the setting of COVID-19:   Patient is at increased risk for thrombosis in the setting of acute COVID-19 infection, already on DOAC therapy given history of DVT.   - Patient is not on oxygen  and should receive standard VTE pharmacologic prophylaxis.  - Xarelto 20 qd, Home dose and frequency    Advanced care planning  Goals of Care OR Palliative care consult (Page [phone]) if clinical worsening and escalation to ICU expected. Co-morbidities that may increase risk of critical illness include advanced age, obesity, HTN, DM, conditions causing chronic organ failure, or immunocompromising conditions.   - Code status: FULL CODE  - Healthcare proxy: Wife       Other Medical Issues:  CFRD on insulin:   Follows with [facility], has CGM device. Glycemic control acceptable since admit.   - Lantus 50 qhs  - Lispro 30 units TID AC  - SSI Standard Scale   ??  H/o DVT:  - Xarelto 20 qd  ??  Depression:   - Fluoxetine, Melatonin, Trazodone  ??  HTN:  - Lisinopril 10 qd  ??  Low back pain   - Robaxan prn   - Oxycodone 5-10 prn  - Gabapentin 600 TID    ??  RLS:   - Pramipexole      DVT prophylaxis. Xarelto      Disposition: Floor.  Probable DC home with home health at some point.     Please page the [facility]) pager at [phone] with questions.      Pending labs:   Pending Labs     Order Current Status    AFB culture In process    Blood Culture #1 Preliminary result    Blood Culture #2 Preliminary result    CF Sputum/ CF Sinus Culture Preliminary result          Subjective:   PIV lost. Still having bilateral side/rib pain. Pain is dull, and worse with deep breathing/ coughing, when it can be more pressure like.      Objective:   Physical Exam:    GEN: NAD, up in bed, watching TV.     RESPIRATORY: Breathing pattern was normal and the chest moved symmetrically.  Lung sounds were normal and there were no adventitious sounds.    CARDIOVASCULAR: Heart rate and rhythm were normal.  S1 and S2 were normal and there were no extra sounds or murmurs. J  ABDOMEN: The abdomen was obese in contour.  Bowel sounds were present.    Palpation detected no tenderness.   NEUROLOGIC: Mental status was normal.  The patient was normally coordinated in the arms.   PSYCHIATRIC: The patient was oriented to person, place, time, and circumstance. Speech was normal. Mood and affect were normal     Vital signs in last 24 hours:  Temp:  [36.6 ??C (97.9 ??F)-37 ??C (98.6 ??F)] 36.6 ??C (97.9 ??F)  Heart Rate:  [84-104] 84  Resp:  [16-18] 18  BP: (107-132)/(70-82) 107/70  MAP (mmHg):  [81-97] 81  SpO2:  [97 %-98 %] 98 %    Intake/Output last 24 hours:  No intake or output data in the 24 hours ending 03/16/21 0751    Medications:   Scheduled Meds:  ??? albuterol  2.5 mg Nebulization TID   ??? atorvastatin  20 mg Oral Nightly   ??? cetirizine  10 mg Oral Daily   ??? dornase alfa  2.5 mg Inhalation Daily   ??? elexacaftor-tezacaftor-ivacaft  2 tablet Oral Q AM    And   ??? elexacaftor-tezacaftor-ivacaft  1 tablet Oral Q PM   ??? FLUoxetine  60 mg Oral Daily   ??? fluticasone furoate-vilanteroL  1 puff Inhalation Daily (RT)   ??? fluticasone  propionate  1 spray Each Nare Daily   ??? gabapentin  600 mg Oral TID   ??? insulin glargine  50 Units Subcutaneous Nightly   ??? insulin lispro  0-12 Units Subcutaneous ACHS   ??? insulin lispro  30 Units Subcutaneous TID AC   ??? lamoTRIgine  200 mg Oral BID   ??? lisinopriL  10 mg Oral Daily   ??? melatonin  10.5 mg Oral Nightly   ??? montelukast  10 mg Oral Nightly   ??? pancrelipase (Lip-Prot-Amyl)  288,000 units of lipase Oral 3xd Meals   ??? pantoprazole  20 mg Oral Daily   ??? MVW Complete (pediatric multivit 61-D3-vit K)  2 capsule Oral BID   ??? piperacillin-tazobactam (ZOSYN) IV (intermittent)  4.5 g Intravenous Q6H SCH   ??? polyethylene glycol  17 g Oral Daily   ??? pramipexole  0.25 mg Oral Q PM   ??? remdesivir  100 mg Intravenous Daily   ??? rivaroxaban  20 mg Oral Daily   ??? senna  2 tablet Oral Nightly   ??? sodium chloride 7%  4 mL Nebulization TID   ??? tobramycin (NEBCIN) IVPB (conventional dosing)  260 mg Intravenous Q12H   ??? traZODone  200 mg Oral Nightly     Continuous Infusions:  ??? sodium chloride 30 mL/hr (03/14/21 0508)       D Linford Arnold MD  [facility].

## 2021-03-17 LAB — BASIC METABOLIC PANEL
ANION GAP: 6 mmol/L (ref 5–14)
BLOOD UREA NITROGEN: 13 mg/dL (ref 9–23)
BUN / CREAT RATIO: 17
CALCIUM: 8.3 mg/dL — ABNORMAL LOW (ref 8.7–10.4)
CHLORIDE: 99 mmol/L (ref 98–107)
CO2: 30.2 mmol/L (ref 20.0–31.0)
CREATININE: 0.78 mg/dL
EGFR CKD-EPI (2021) MALE: >90 mL/min/{1.73_m2} (ref >=60)
GLUCOSE RANDOM: 154 mg/dL (ref 70–179)
POTASSIUM: 4.2 mmol/L (ref 3.4–4.8)
SODIUM: 135 mmol/L (ref 135–145)

## 2021-03-17 LAB — TOBRAMYCIN LEVEL, PEAK: TOBRAMYCIN PEAK: 10.2 ug/mL — ABNORMAL HIGH (ref 4.0–10.0)

## 2021-03-17 LAB — PROTIME-INR
INR: 1.2
PROTIME: 14 s — ABNORMAL HIGH (ref 10.3–13.4)

## 2021-03-17 LAB — CBC
HEMATOCRIT: 36.3 % — ABNORMAL LOW (ref 39.0–48.0)
HEMOGLOBIN: 12.3 g/dL — ABNORMAL LOW (ref 12.9–16.5)
MEAN CORPUSCULAR HEMOGLOBIN CONC: 33.9 g/dL (ref 32.0–36.0)
MEAN CORPUSCULAR HEMOGLOBIN: 27.8 pg (ref 25.9–32.4)
MEAN CORPUSCULAR VOLUME: 82.1 fL (ref 77.6–95.7)
MEAN PLATELET VOLUME: 7.6 fL (ref 6.8–10.7)
PLATELET COUNT: 238 10*9/L (ref 150–450)
RED BLOOD CELL COUNT: 4.42 10*12/L (ref 4.26–5.60)
RED CELL DISTRIBUTION WIDTH: 15.6 % — ABNORMAL HIGH (ref 12.2–15.2)
WBC ADJUSTED: 3.8 10*9/L (ref 3.6–11.2)

## 2021-03-17 LAB — TOBRAMYCIN LEVEL, TROUGH: TOBRAMYCIN TROUGH: 0.4 ug/mL (ref 0.0–2.0)

## 2021-03-17 MED ADMIN — sodium chloride 7% nebulizer solution 4 mL: 4 mL | RESPIRATORY_TRACT | @ 15:00:00

## 2021-03-17 MED ADMIN — montelukast (SINGULAIR) tablet 10 mg: 10 mg | ORAL | @ 01:00:00

## 2021-03-17 MED ADMIN — ketorolac (TORADOL) injection 15 mg: 15 mg | INTRAVENOUS | @ 22:00:00 | Stop: 2021-03-22

## 2021-03-17 MED ADMIN — pancrelipase (Lip-Prot-Amyl) (CREON) 24,000-76,000 -120,000 unit delayed release capsule 288,000 units of lipase: 288000 [IU] | ORAL | @ 14:00:00

## 2021-03-17 MED ADMIN — polyethylene glycol (MIRALAX) packet 17 g: 17 g | ORAL | @ 14:00:00

## 2021-03-17 MED ADMIN — sodium chloride 7% nebulizer solution 4 mL: 4 mL | RESPIRATORY_TRACT | @ 02:00:00

## 2021-03-17 MED ADMIN — oxyCODONE (ROXICODONE) immediate release tablet 10 mg: 10 mg | ORAL | @ 01:00:00 | Stop: 2021-03-21

## 2021-03-17 MED ADMIN — senna (SENOKOT) tablet 2 tablet: 2 | ORAL | @ 01:00:00

## 2021-03-17 MED ADMIN — tobramycin (NEBCIN) 260 mg in sodium chloride (NS) 0.9 % 100 mL IVPB: 260 mg | INTRAVENOUS | @ 23:00:00 | Stop: 2021-03-28

## 2021-03-17 MED ADMIN — pancrelipase (Lip-Prot-Amyl) (CREON) 24,000-76,000 -120,000 unit delayed release capsule 288,000 units of lipase: 288000 [IU] | ORAL | @ 22:00:00

## 2021-03-17 MED ADMIN — piperacillin-tazobactam (ZOSYN) IVPB (premix) 4.5 g: 4.5 g | INTRAVENOUS | @ 04:00:00 | Stop: 2021-03-28

## 2021-03-17 MED ADMIN — insulin lispro (HumaLOG) injection 30 Units: 30 [IU] | SUBCUTANEOUS | @ 17:00:00

## 2021-03-17 MED ADMIN — fluticasone propionate (FLONASE) 50 mcg/actuation nasal spray 1 spray: 1 | NASAL | @ 14:00:00

## 2021-03-17 MED ADMIN — albuterol 2.5 mg /3 mL (0.083 %) nebulizer solution 2.5 mg: 2.5 mg | RESPIRATORY_TRACT | @ 15:00:00

## 2021-03-17 MED ADMIN — gabapentin (NEURONTIN) capsule 600 mg: 600 mg | ORAL | @ 01:00:00

## 2021-03-17 MED ADMIN — fluticasone furoate-vilanteroL (BREO ELLIPTA) 200-25 mcg/dose inhaler 1 puff: 1 | RESPIRATORY_TRACT | @ 15:00:00

## 2021-03-17 MED ADMIN — gabapentin (NEURONTIN) capsule 600 mg: 600 mg | ORAL | @ 14:00:00

## 2021-03-17 MED ADMIN — atorvastatin (LIPITOR) tablet 20 mg: 20 mg | ORAL | @ 01:00:00

## 2021-03-17 MED ADMIN — elexacaftor-tezacaftor-ivacaft (TRIKAFTA) 2 ORANGE tablets (PATIENT'S OWN SUPPLY): 2 | ORAL | @ 14:00:00

## 2021-03-17 MED ADMIN — piperacillin-tazobactam (ZOSYN) IVPB (premix) 4.5 g: 4.5 g | INTRAVENOUS | @ 10:00:00 | Stop: 2021-03-28

## 2021-03-17 MED ADMIN — gabapentin (NEURONTIN) capsule 600 mg: 600 mg | ORAL | @ 17:00:00

## 2021-03-17 MED ADMIN — oxyCODONE (ROXICODONE) immediate release tablet 10 mg: 10 mg | ORAL | @ 17:00:00 | Stop: 2021-03-21

## 2021-03-17 MED ADMIN — melatonin tablet 10.5 mg: 10 mg | ORAL | @ 01:00:00

## 2021-03-17 MED ADMIN — pramipexole (MIRAPEX) tablet 0.25 mg: .25 mg | ORAL | @ 22:00:00

## 2021-03-17 MED ADMIN — piperacillin-tazobactam (ZOSYN) IVPB (premix) 4.5 g: 4.5 g | INTRAVENOUS | @ 22:00:00 | Stop: 2021-03-28

## 2021-03-17 MED ADMIN — traZODone (DESYREL) tablet 200 mg: 200 mg | ORAL | @ 01:00:00

## 2021-03-17 MED ADMIN — lisinopriL (PRINIVIL,ZESTRIL) tablet 10 mg: 10 mg | ORAL | @ 14:00:00

## 2021-03-17 MED ADMIN — cetirizine (ZyrTEC) tablet 10 mg: 10 mg | ORAL | @ 14:00:00

## 2021-03-17 MED ADMIN — rivaroxaban (XARELTO) tablet 20 mg: 20 mg | ORAL | @ 14:00:00

## 2021-03-17 MED ADMIN — pantoprazole (PROTONIX) EC tablet 20 mg: 20 mg | ORAL | @ 14:00:00

## 2021-03-17 MED ADMIN — remdesivir (VEKLURY) 100 mg in sodium chloride (NS) 0.9 % 295 mL IVPB: 100 mg | INTRAVENOUS | @ 14:00:00 | Stop: 2021-03-19

## 2021-03-17 MED ADMIN — acetaminophen (TYLENOL) tablet 650 mg: 650 mg | ORAL | @ 01:00:00

## 2021-03-17 MED ADMIN — insulin lispro (HumaLOG) injection 30 Units: 30 [IU] | SUBCUTANEOUS | @ 22:00:00

## 2021-03-17 MED ADMIN — piperacillin-tazobactam (ZOSYN) IVPB (premix) 4.5 g: 4.5 g | INTRAVENOUS | @ 17:00:00 | Stop: 2021-03-28

## 2021-03-17 MED ADMIN — dornase alfa (PULMOZYME) 1 mg/mL solution 2.5 mg: 2.5 mg | RESPIRATORY_TRACT | @ 15:00:00

## 2021-03-17 MED ADMIN — tobramycin (NEBCIN) 260 mg in sodium chloride (NS) 0.9 % 100 mL IVPB: 260 mg | INTRAVENOUS | @ 11:00:00 | Stop: 2021-03-28

## 2021-03-17 MED ADMIN — elexacaftor-tezacaftor-ivacaft (TRIKAFTA) one (1) BLUE tablet (PATIENT'S OWN SUPPLY): 1 | ORAL | @ 22:00:00

## 2021-03-17 MED ADMIN — insulin lispro (HumaLOG) injection 30 Units: 30 [IU] | SUBCUTANEOUS | @ 14:00:00

## 2021-03-17 MED ADMIN — pediatric multivitamin-vit D3 1,500 unit-vit K 800 mcg (MVW COMPLETE FORMULATION) capsule: 2 | ORAL | @ 01:00:00

## 2021-03-17 MED ADMIN — albuterol 2.5 mg /3 mL (0.083 %) nebulizer solution 2.5 mg: 2.5 mg | RESPIRATORY_TRACT | @ 20:00:00

## 2021-03-17 MED ADMIN — lamoTRIgine (LaMICtal) tablet 200 mg: 200 mg | ORAL | @ 14:00:00

## 2021-03-17 MED ADMIN — lamoTRIgine (LaMICtal) tablet 200 mg: 200 mg | ORAL | @ 02:00:00

## 2021-03-17 MED ADMIN — insulin glargine (LANTUS) injection 50 Units: 50 [IU] | SUBCUTANEOUS | @ 02:00:00

## 2021-03-17 MED ADMIN — oxyCODONE (ROXICODONE) immediate release tablet 10 mg: 10 mg | ORAL | @ 08:00:00 | Stop: 2021-03-21

## 2021-03-17 MED ADMIN — insulin lispro (HumaLOG) injection 0-12 Units: 0-12 [IU] | SUBCUTANEOUS | @ 17:00:00

## 2021-03-17 MED ADMIN — pancrelipase (Lip-Prot-Amyl) (CREON) 24,000-76,000 -120,000 unit delayed release capsule 288,000 units of lipase: 288000 [IU] | ORAL | @ 17:00:00

## 2021-03-17 MED ADMIN — FLUoxetine (PROzac) capsule 60 mg: 60 mg | ORAL | @ 14:00:00

## 2021-03-17 MED ADMIN — pediatric multivitamin-vit D3 1,500 unit-vit K 800 mcg (MVW COMPLETE FORMULATION) capsule: 2 | ORAL | @ 14:00:00

## 2021-03-17 MED ADMIN — albuterol 2.5 mg /3 mL (0.083 %) nebulizer solution 2.5 mg: 2.5 mg | RESPIRATORY_TRACT | @ 02:00:00

## 2021-03-17 MED ADMIN — sodium chloride 7% nebulizer solution 4 mL: 4 mL | RESPIRATORY_TRACT | @ 20:00:00

## 2021-03-17 MED ADMIN — insulin lispro (HumaLOG) injection 0-12 Units: 0-12 [IU] | SUBCUTANEOUS | @ 22:00:00

## 2021-03-17 MED ADMIN — methocarbamoL (ROBAXIN) tablet 500 mg: 500 mg | ORAL | @ 01:00:00

## 2021-03-17 NOTE — Unmapped (Signed)
PULMONARY CONSULT  NOTE      Patient: Christian Young(05-18-1986)  Reason for consultation: Christian Young is a 35 y.o. male who is seen in consultation at the request of Hubbard Robinson, MD for comprehensive evaluation of COVID in CF exacerbation.    Assessment and Recommendations:      Principal Problem:    Cystic fibrosis with pulmonary exacerbation (CMS-HCC)  Active Problems:    Essential hypertension    Depressive disorder    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)    Pancreatic insufficiency due to cystic fibrosis (CMS-HCC)    History of Mycobacterium abscessus infection    Chronic deep vein thrombosis (DVT) of lower extremity (CMS-HCC)    Restless leg syndrome    Degeneration of lumbar intervertebral disc  Resolved Problems:    * No resolved hospital problems. *    Christian Young??is a 35 y.o.??male??with PMHx of CF who??presented to [facility] with??Cystic fibrosis with pulmonary exacerbation (CMS-HCC) secondary to COVID.  ??  1. Acute exacerbation of CF bronchiectasis with COVID:   Agree with treatment with Remdesivir per ID recs, appreciate. He is not hypoxemic and overall does not have as much cough/sputum as usual.  He feels unwell although sputum culture still pending. We will see how he does over the next few days to determine how much is COVID related vs some bacterial infection. Prior MSSA and 2 variants of PsA (one pan resistant and one with intermediate resistance).  For now continue Zosyn??4.5 mg q6h, tobramycin??IV, pharmacy to dose, does BID dosing. Expect he will need 10 days. CXR unchanged.   - Continue Trikafta (non-formulary), Breo (formulary alternative for Symbicort) and Pulmozyme.   - Airway clearance with albuterol TID, 7% hypertonic saline, chest vest, and aerobika  ??  2. CFRD:   - Continue current insulin regimen. Glucoses doing well.  ??    We appreciate the opportunity to assist in the care of this patient.  Please page [phone] with any questions.    Pablo Ledger, MD    Subjective:      History of Present Illness:  Christian Young is a 35 y.o. male admitted for cystic fibrosis exacerbation. He has a history of CF (Y865H and [id number] insertion) on trikafta, CFRD on insulin, history of DVT (on xarelto currently), with recent baseline lung function of FEV1 ~72% .  ??  Last week started feeling fatigued with sinus symptoms which is typical of his exacerbations but then started having fevers which is unusual. He home COVID tested several days ago and was negative. He then presented to the ED last night, was COVID positive on the RPR (rapid was negative first).     He has been having chest pain - typical during exacerbations but also whole body aches and pains which is atypical for him.     8/5: Still with lots of fatigue. Had more pain overnight, would like an IV prn once nightly available. ??    Review of Systems: A comprehensive review of systems was performed and was negative except as above in HPI  Past Medical History:   Diagnosis Date   ??? Anxiety    ??? Chronic pain disorder    ??? Cystic fibrosis (CMS-HCC)    ??? Depression    ??? Hypertension    ??? Lumbar radiculopathy 10/26/2020   ??? Nonproductive cough 04/05/2018     Past Surgical History:   Procedure Laterality Date   ??? IR INSERT PORT AGE GREATER THAN 5 YRS  03/27/2019    IR INSERT PORT AGE GREATER THAN 5 YRS 03/27/2019 Rush Barer, MD IMG VIR [facility]   ??? PR REMOVAL OF LUNG,LOBECTOMY Right 03/29/2017    Procedure: REMOVAL OF LUNG, OTHER THAN PNEUMONECTOMY; SINGLE LOBE (LOBECTOMY);  Surgeon: Cherie Dark, MD;  Location: MAIN OR [facility];  Service: Thoracic     Medications reviewed in Epic  Allergies as of 03/13/2021 - Reviewed 03/13/2021   Allergen Reaction Noted   ??? Aztreonam Anaphylaxis, Hives, Nausea And Vomiting, and Rash 09/06/2015   ??? [place] [aztreonam lysine] Anaphylaxis 12/27/2016   ??? Cefepime Itching, Nausea Only, and Other (See Comments) 09/06/2015   ??? Other Anaphylaxis and Other (See Comments) 09/06/2015   ??? Slo-bid 100 Anaphylaxis 06/20/2017   ??? Tobramycin Tinnitus 09/06/2015   ??? Banana Itching and Nausea And Vomiting 07/29/2016     Family History   Problem Relation Age of Onset   ??? Bipolar disorder Mother    ??? Depression Mother      Social History     Tobacco Use   ??? Smoking status: Former Smoker     Types: e-Cigarettes   ??? Smokeless tobacco: Never Used   Substance Use Topics   ??? Alcohol use: Not Currently     Alcohol/week: 3.0 standard drinks     Types: 3 Glasses of wine per week        Objective:      Physical Exam:  Vitals:    03/17/21 0406 03/17/21 0956 03/17/21 1041 03/17/21 1202   BP: 114/72 119/83  122/73   Pulse: 78  86 88   Resp: 19  18 18    Temp: 37 ??C (98.6 ??F)   36.7 ??C (98.1 ??F)   TempSrc: Oral   Oral   SpO2: 95%  96% 96%   Weight:       Height:         General: NAD  Eyes: Anicteric sclera  ENT:  Mucous membranes moist and intact.  Lymph: No cervical or supraclavicular adenopathy.  Lungs: Good air movement bilaterally. No wheezes or crackles.  Cardiovascular: Regular rate and rhythm.  Abdomen: Soft, non-tender, not distended, bowel sounds are normal  Musculoskeletal: No synovitis.  Skin: No rashes or lesions.  Neuro: No focal neurological deficits.    Malnutrition Assessment by RD:          Diagnostic Review:   All labs and images were personally reviewed.

## 2021-03-17 NOTE — Unmapped (Signed)
[facility] Hospitalist Daily Progress Note     LOS: 3 days     Assessment/Plan:  Principal Problem:    Cystic fibrosis with pulmonary exacerbation (CMS-HCC)  Active Problems:    Essential hypertension    Depressive disorder    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)    Pancreatic insufficiency due to cystic fibrosis (CMS-HCC)    History of Mycobacterium abscessus infection    Chronic deep vein thrombosis (DVT) of lower extremity (CMS-HCC)    Restless leg syndrome    Degeneration of lumbar intervertebral disc  Resolved Problems:    * No resolved hospital problems. *         Christian Young is a 35 y.o. male who presented to [facility] with Cystic fibrosis with pulmonary exacerbation (CMS-HCC).    CF with exacerbation:   Exacerbation is due to CF alone, and not COVID.   Lung sounds are clear this morning.    - Pulmonology Medicine following    - FU CF sputum culture, AFB culture (has previously grown NTM)  - Audiology consult. Has a history of tinnitus with tobramycin)  - Zosyn (4.5 mg q6 hours) and IV tobramycin pharmacy to dose  - PFT - repeat testing per Pulmonary Medicine   - Airway clearance with vest and aerobika TID, albuterol  TID, HTS TID, pulmozyme, symbicort formulary sub  - f/u INR: if PT/INR normal on admission start 5 mg PO vitamin K twice a week while on IV antibiotics; if PT/INR elevated on admission start 5 mg PO vitamin K daily and recheck in 5 days - if normalized switch to twice a week while on IV antibiotics, if still elevated continue daily while on IV antibiotics  - cont home enzymes and vitamins  - cont home trikafta (pt brought home supply with him)      Acute hypoxemic respiratory failure due to COVID-19 infection with Pneumonia  ID consulted yesterday. Per ID this is likely tail end of infection, but will treat for 3 days of Remdesivir given underlying lung disease.         Date of symptom onset: Approximately 1 week prior to admission.         Date of positive SARS-CoV-2 PCR: 03/17/2021        Exposure: Unknown, while wife is an EMT, she is asymptomatic.        Vaccination status:   COVID-19 Vaccine History     Name Date Dose Route    COVID-19 VACC,MRNA,(PFIZER)(PF)(IM) 07/11/2020  1:46 PM 0.3 mL Intramuscular    Site: Right deltoid    COVID-19 VACC,MRNA,(PFIZER)(PF)(IM) 11/21/2019 0.3 mL --    External: Historical information - from other registry    COVID-19 VACC,MRNA,(PFIZER)(PF)(IM) 10/31/2019 0.3 mL Intramuscular    Site: Left deltoid    External: Historical information - from other registry              Therapy: Remdesivir  for 3 days.        Oxygen: Room air   - Maintain SpO2 92-96%   - If indicated, Daily labs: CBC, BMP (add hepatic fx panel if on Remdesevir)  - Monitor q48h-72h CRP, D-dimer, LDH      Hypercoagulable state in the setting of COVID-19:   Patient is at increased risk for thrombosis in the setting of acute COVID-19 infection, already on DOAC therapy given history of DVT.   - Patient is not on oxygen and should receive standard VTE pharmacologic prophylaxis.  - Xarelto 20 qd, Home dose  and frequency    Advanced care planning  Goals of Care OR Palliative care consult (Page [phone]) if clinical worsening and escalation to ICU expected. Co-morbidities that may increase risk of critical illness include advanced age, obesity, HTN, DM, conditions causing chronic organ failure, or immunocompromising conditions.   - Code status: FULL CODE  - Healthcare proxy: Wife       Other Medical Issues:  CFRD on insulin:   Follows with [facility], has CGM device. Glycemic control excellent since admit.   - Lantus 50 qhs  - Lispro 30 units TID AC  - SSI Standard Scale   ??  H/o DVT:  - Xarelto 20 qd  ??  Depression:   - Fluoxetine, Melatonin, Trazodone  ??  HTN:  - Lisinopril 10 qd  ??  Chronic Low back pain   Per chart review there has been a concern for drug seeking behavior since at least 2018. Since the beginning of this admission a handful of antidotes have accumulated, that could also be consistent with drug seeking behavior. We see there is no clear indication to have daily IV prn opioids available given his overall clinical condition currently.   - Robaxan prn   - Oxycodone 5-10 prn  - Gabapentin 600 TID  - Add prn toradol for pain.     ??  RLS:   - Pramipexole      DVT prophylaxis. Xarelto      Disposition: Floor.  Could DC home with home health at some point, however he refuses home health.      Please page the [facility]) pager at [phone] with questions.      Pending labs:   Pending Labs     Order Current Status    AFB culture In process    Basic metabolic panel In process    PT-INR In process    Tobramycin Level, Peak In process    Blood Culture #1 Preliminary result    Blood Culture #2 Preliminary result    CF Sputum/ CF Sinus Culture Preliminary result          Subjective:   Right sided chest pain spread to right arm overnight. He notes that he did not receive any additional pain medications for this. This lasted for a couple of hours before spontaneously resolving. Functionally he has non impairments in his arm this morning.  No breathing complaints currently.     Objective:   Physical Exam:    GEN: NAD, supine in bed, watching TV.     RESPIRATORY: Breathing pattern was normal and the chest moved symmetrically.  Lung sounds were normal and there were no adventitious sounds.    CARDIOVASCULAR: Heart rate and rhythm were normal.  S1 and S2 were normal and there were no extra sounds or murmurs.   ABDOMEN: The abdomen was obese in contour.  Bowel sounds were present.  Palpation detected no tenderness.   NEUROLOGIC: Mental status was normal.  The patient was normally coordinated in the arms.   PSYCHIATRIC: The patient was oriented to person, place, time, and circumstance. Speech was normal. Mood and affect were normal     Vital signs in last 24 hours:  Temp:  [36.7 ??C (98.1 ??F)-37.2 ??C (99 ??F)] 37 ??C (98.6 ??F)  Heart Rate:  [78-93] 78  Resp:  [18-19] 19  BP: (114-141)/(72-96) 114/72  MAP (mmHg):  [83-106] 83  SpO2:  [95 %-100 %] 95 %    Intake/Output last 24 hours:  No  intake or output data in the 24 hours ending 03/17/21 0757    Medications:   Scheduled Meds:  ??? albuterol  2.5 mg Nebulization Q6H While awake (RT)   ??? atorvastatin  20 mg Oral Nightly   ??? cetirizine  10 mg Oral Daily   ??? dornase alfa  2.5 mg Inhalation Daily   ??? elexacaftor-tezacaftor-ivacaft  2 tablet Oral Q AM    And   ??? elexacaftor-tezacaftor-ivacaft  1 tablet Oral Q PM   ??? FLUoxetine  60 mg Oral Daily   ??? fluticasone furoate-vilanteroL  1 puff Inhalation Daily (RT)   ??? fluticasone propionate  1 spray Each Nare Daily   ??? gabapentin  600 mg Oral TID   ??? insulin glargine  50 Units Subcutaneous Nightly   ??? insulin lispro  0-12 Units Subcutaneous ACHS   ??? insulin lispro  30 Units Subcutaneous TID AC   ??? lamoTRIgine  200 mg Oral BID   ??? lisinopriL  10 mg Oral Daily   ??? melatonin  10.5 mg Oral Nightly   ??? montelukast  10 mg Oral Nightly   ??? pancrelipase (Lip-Prot-Amyl)  288,000 units of lipase Oral 3xd Meals   ??? pantoprazole  20 mg Oral Daily   ??? MVW Complete (pediatric multivit 61-D3-vit K)  2 capsule Oral BID   ??? piperacillin-tazobactam (ZOSYN) IV (intermittent)  4.5 g Intravenous Q6H SCH   ??? polyethylene glycol  17 g Oral Daily   ??? pramipexole  0.25 mg Oral Q PM   ??? remdesivir  100 mg Intravenous Daily   ??? rivaroxaban  20 mg Oral Daily   ??? senna  2 tablet Oral Nightly   ??? sodium chloride 7%  4 mL Nebulization Q6H While awake (RT)   ??? tobramycin (NEBCIN) IVPB (conventional dosing)  260 mg Intravenous Q12H   ??? traZODone  200 mg Oral Nightly     Continuous Infusions:  ??? sodium chloride 30 mL/hr (03/14/21 0508)       D Linford Arnold MD  [facility].

## 2021-03-17 NOTE — Unmapped (Signed)
Pt complained of chest pain radiating to the right hand with increase in intensity., MD on call was made aware, EKG and trop level ordered and drawn, pain managed with PRN oxy, pt states pain on right arm is getting better, POC, VSS, medication administered as ordered, call bell within reach, isolation precautions maintained, will continue monitoring.   Problem: Adult Inpatient Plan of Care  Goal: Plan of Care Review  Outcome: Ongoing - Unchanged  Goal: Patient-Specific Goal (Individualized)  Outcome: Ongoing - Unchanged  Goal: Absence of Hospital-Acquired Illness or Injury  Outcome: Ongoing - Unchanged  Intervention: Identify and Manage Fall Risk  Recent Flowsheet Documentation  Taken 03/17/2021 0400 by Oletta Cohn, RN  Safety Interventions:   fall reduction program maintained   lighting adjusted for tasks/safety   low bed   nonskid shoes/slippers when out of bed  Taken 03/17/2021 0200 by Oletta Cohn, RN  Safety Interventions:   lighting adjusted for tasks/safety   low bed   nonskid shoes/slippers when out of bed  Taken 03/17/2021 0000 by Oletta Cohn, RN  Safety Interventions:   isolation precautions   infection management   lighting adjusted for tasks/safety   low bed  Taken 03/16/2021 2200 by Oletta Cohn, RN  Safety Interventions:   lighting adjusted for tasks/safety   low bed   nonskid shoes/slippers when out of bed  Taken 03/16/2021 2000 by Oletta Cohn, RN  Safety Interventions:   lighting adjusted for tasks/safety   low bed   nonskid shoes/slippers when out of bed   infection management   isolation precautions  Intervention: Prevent and Manage VTE (Venous Thromboembolism) Risk  Recent Flowsheet Documentation  Taken 03/16/2021 2000 by Oletta Cohn, RN  Activity Management:   activity adjusted per tolerance   activity encouraged  Intervention: Prevent Infection  Recent Flowsheet Documentation  Taken 03/17/2021 0200 by Oletta Cohn, RN  Infection Prevention:   equipment surfaces disinfected   hand hygiene promoted  Taken 03/17/2021 0000 by Oletta Cohn, RN  Infection Prevention:   equipment surfaces disinfected   hand hygiene promoted   personal protective equipment utilized  Taken 03/16/2021 2200 by Oletta Cohn, RN  Infection Prevention: hand hygiene promoted  Taken 03/16/2021 2000 by Oletta Cohn, RN  Infection Prevention:   equipment surfaces disinfected   hand hygiene promoted  Goal: Optimal Comfort and Wellbeing  Outcome: Ongoing - Unchanged  Goal: Readiness for Transition of Care  Outcome: Ongoing - Unchanged  Goal: Rounds/Family Conference  Outcome: Ongoing - Unchanged     Problem: Infection  Goal: Absence of Infection Signs and Symptoms  Outcome: Ongoing - Unchanged  Intervention: Prevent or Manage Infection  Recent Flowsheet Documentation  Taken 03/17/2021 0200 by Oletta Cohn, RN  Infection Management: aseptic technique maintained  Isolation Precautions: spec airborne/contact precautions maintained  Taken 03/17/2021 0000 by Oletta Cohn, RN  Infection Management: aseptic technique maintained  Isolation Precautions: spec airborne/contact precautions maintained  Taken 03/16/2021 2200 by Oletta Cohn, RN  Isolation Precautions: spec airborne/contact precautions maintained  Taken 03/16/2021 2000 by Oletta Cohn, RN  Isolation Precautions: spec airborne/contact precautions maintained     Problem: COPD (Chronic Obstructive Pulmonary Disease) Comorbidity  Goal: Maintenance of COPD Symptom Control  Outcome: Ongoing - Unchanged     Problem: Hypertension Comorbidity  Goal: Blood Pressure in Desired Range  Outcome: Ongoing - Unchanged     Problem: Impaired Wound Healing  Goal: Optimal Wound Healing  Outcome: Ongoing - Unchanged  Intervention: Promote Wound Healing  Recent Flowsheet Documentation  Taken 03/16/2021 2000  by Oletta Cohn, RN  Activity Management:   activity adjusted per tolerance   activity encouraged     Problem: Adjustment to Illness (Cystic Fibrosis)  Goal: Optimal Coping  Outcome: Ongoing - Unchanged     Problem: Infection (Cystic Fibrosis)  Goal: Absence of Infection Signs and Symptoms  Outcome: Ongoing - Unchanged  Intervention: Manage Infection and Prevent Transmission  Recent Flowsheet Documentation  Taken 03/17/2021 0200 by Oletta Cohn, RN  Infection Management: aseptic technique maintained  Isolation Precautions: spec airborne/contact precautions maintained  Taken 03/17/2021 0000 by Oletta Cohn, RN  Infection Management: aseptic technique maintained  Isolation Precautions: spec airborne/contact precautions maintained  Taken 03/16/2021 2200 by Oletta Cohn, RN  Isolation Precautions: spec airborne/contact precautions maintained  Taken 03/16/2021 2000 by Oletta Cohn, RN  Isolation Precautions: spec airborne/contact precautions maintained     Problem: Oral Intake Inadequate (Cystic Fibrosis)  Goal: Optimal Nutrition Intake  Outcome: Ongoing - Unchanged     Problem: Malabsorption (Cystic Fibrosis)  Goal: Optimal Bowel Elimination  Outcome: Ongoing - Unchanged     Problem: Respiratory Compromise (Cystic Fibrosis)  Goal: Effective Oxygenation and Ventilation  Outcome: Ongoing - Unchanged

## 2021-03-17 NOTE — Unmapped (Signed)
Christian Young has been in good spirits all day.  He had less complaints of pain.  PIV was removed due to occlusion.  Physicians saw no need to put in another.  He has eaten well all day.    Problem: Adult Inpatient Plan of Care  Goal: Absence of Hospital-Acquired Illness or Injury  Intervention: Identify and Manage Fall Risk  Recent Flowsheet Documentation  Taken 03/16/2021 0800 by Nadara Eaton, RN  Safety Interventions:   fall reduction program maintained   infection management   isolation precautions   lighting adjusted for tasks/safety   low bed   nonskid shoes/slippers when out of bed   room near unit station     Problem: Adult Inpatient Plan of Care  Goal: Absence of Hospital-Acquired Illness or Injury  Intervention: Prevent Skin Injury  Recent Flowsheet Documentation  Taken 03/16/2021 0845 by Nadara Eaton, RN  Skin Protection: incontinence pads utilized  Taken 03/16/2021 0800 by Nadara Eaton, RN  Skin Protection: incontinence pads utilized     Problem: Adult Inpatient Plan of Care  Goal: Absence of Hospital-Acquired Illness or Injury  Intervention: Prevent Infection  Recent Flowsheet Documentation  Taken 03/16/2021 0800 by Nadara Eaton, RN  Infection Prevention:   cohorting utilized   environmental surveillance performed   equipment surfaces disinfected   hand hygiene promoted   personal protective equipment utilized   rest/sleep promoted   single patient room provided     Problem: Adult Inpatient Plan of Care  Goal: Optimal Comfort and Wellbeing  Outcome: Progressing

## 2021-03-17 NOTE — Unmapped (Signed)
Aminoglycoside Therapeutic Monitoring Pharmacy Note    Christian Young is a 35 y.o. male continuing tobramycin. Date of therapy initiation: 03/14/21    Indication: CF exacerbation    Prior Dosing Information: Previous regimen 260 mg IV q 12 hrs (April 2022)     Goals:  Therapeutic Drug Levels  ?? Trough level: tobramycin <1 mg/L  ?? Peak level: tobramycin 10-14 mg/L    Additional Clinical Monitoring/Outcomes  Renal function, volume status (intake and output)    Results:   ?? Trough 0.4 Drawn appropriately   ?? peak level 10.2 drawn appropriately    Wt Readings from Last 1 Encounters:   03/14/21 (!) 111.2 kg (245 lb 1.6 oz)     Lab Results   Component Value Date    CREATININE 0.78 03/17/2021       Pharmacokinetic Considerations and Significant Drug Interactions:  ??? Adult (estimated initial): Vd = 29.8 L, ke = 0.33 hr-1  ??? Concurrent nephrotoxic meds: Zosyn    Assessment/Plan:  Recommendation(s)  ??? Continue current regimen of 260mg  IV Q12  ??? Estimated peak and trough on recommended regimen: peak = 10.5 mg/L, trough = 0.1 mg/L    Follow-up  ??? Level due: in 3-5 days  ??? A pharmacist will continue to monitor and order levels as appropriate    Please page service pharmacist with questions/clarifications.    Doneta Public, PharmD, BCPS

## 2021-03-18 MED ADMIN — lamoTRIgine (LaMICtal) tablet 200 mg: 200 mg | ORAL | @ 13:00:00

## 2021-03-18 MED ADMIN — gabapentin (NEURONTIN) capsule 600 mg: 600 mg | ORAL | @ 13:00:00

## 2021-03-18 MED ADMIN — ketorolac (TORADOL) injection 15 mg: 15 mg | INTRAVENOUS | @ 04:00:00 | Stop: 2021-03-22

## 2021-03-18 MED ADMIN — elexacaftor-tezacaftor-ivacaft (TRIKAFTA) one (1) BLUE tablet (PATIENT'S OWN SUPPLY): 1 | ORAL | @ 22:00:00

## 2021-03-18 MED ADMIN — insulin lispro (HumaLOG) injection 0-12 Units: 0-12 [IU] | SUBCUTANEOUS | @ 13:00:00

## 2021-03-18 MED ADMIN — piperacillin-tazobactam (ZOSYN) IVPB (premix) 4.5 g: 4.5 g | INTRAVENOUS | @ 23:00:00 | Stop: 2021-03-28

## 2021-03-18 MED ADMIN — lisinopriL (PRINIVIL,ZESTRIL) tablet 10 mg: 10 mg | ORAL | @ 13:00:00

## 2021-03-18 MED ADMIN — pediatric multivitamin-vit D3 1,500 unit-vit K 800 mcg (MVW COMPLETE FORMULATION) capsule: 2 | ORAL | @ 13:00:00

## 2021-03-18 MED ADMIN — pantoprazole (PROTONIX) EC tablet 20 mg: 20 mg | ORAL | @ 13:00:00

## 2021-03-18 MED ADMIN — tobramycin (NEBCIN) 260 mg in sodium chloride (NS) 0.9 % 100 mL IVPB: 260 mg | INTRAVENOUS | @ 21:00:00 | Stop: 2021-03-28

## 2021-03-18 MED ADMIN — piperacillin-tazobactam (ZOSYN) IVPB (premix) 4.5 g: 4.5 g | INTRAVENOUS | @ 04:00:00 | Stop: 2021-03-28

## 2021-03-18 MED ADMIN — oxyCODONE (ROXICODONE) immediate release tablet 10 mg: 10 mg | ORAL | @ 17:00:00 | Stop: 2021-03-21

## 2021-03-18 MED ADMIN — atorvastatin (LIPITOR) tablet 20 mg: 20 mg | ORAL | @ 01:00:00

## 2021-03-18 MED ADMIN — insulin lispro (HumaLOG) injection 30 Units: 30 [IU] | SUBCUTANEOUS | @ 22:00:00

## 2021-03-18 MED ADMIN — sodium chloride 7% nebulizer solution 4 mL: 4 mL | RESPIRATORY_TRACT | @ 20:00:00

## 2021-03-18 MED ADMIN — pancrelipase (Lip-Prot-Amyl) (CREON) 24,000-76,000 -120,000 unit delayed release capsule 288,000 units of lipase: 288000 [IU] | ORAL | @ 13:00:00

## 2021-03-18 MED ADMIN — remdesivir (VEKLURY) 100 mg in sodium chloride (NS) 0.9 % 295 mL IVPB: 100 mg | INTRAVENOUS | @ 13:00:00 | Stop: 2021-03-18

## 2021-03-18 MED ADMIN — albuterol 2.5 mg /3 mL (0.083 %) nebulizer solution 2.5 mg: 2.5 mg | RESPIRATORY_TRACT | @ 20:00:00

## 2021-03-18 MED ADMIN — cetirizine (ZyrTEC) tablet 10 mg: 10 mg | ORAL | @ 13:00:00

## 2021-03-18 MED ADMIN — FLUoxetine (PROzac) capsule 60 mg: 60 mg | ORAL | @ 13:00:00

## 2021-03-18 MED ADMIN — melatonin tablet 10.5 mg: 10 mg | ORAL | @ 01:00:00

## 2021-03-18 MED ADMIN — fluticasone propionate (FLONASE) 50 mcg/actuation nasal spray 1 spray: 1 | NASAL | @ 13:00:00

## 2021-03-18 MED ADMIN — tobramycin (NEBCIN) 260 mg in sodium chloride (NS) 0.9 % 100 mL IVPB: 260 mg | INTRAVENOUS | @ 10:00:00 | Stop: 2021-03-28

## 2021-03-18 MED ADMIN — fluticasone furoate-vilanteroL (BREO ELLIPTA) 200-25 mcg/dose inhaler 1 puff: 1 | RESPIRATORY_TRACT | @ 14:00:00

## 2021-03-18 MED ADMIN — rivaroxaban (XARELTO) tablet 20 mg: 20 mg | ORAL | @ 13:00:00

## 2021-03-18 MED ADMIN — pancrelipase (Lip-Prot-Amyl) (CREON) 24,000-76,000 -120,000 unit delayed release capsule 288,000 units of lipase: 288000 [IU] | ORAL | @ 21:00:00

## 2021-03-18 MED ADMIN — elexacaftor-tezacaftor-ivacaft (TRIKAFTA) 2 ORANGE tablets (PATIENT'S OWN SUPPLY): 2 | ORAL | @ 13:00:00

## 2021-03-18 MED ADMIN — oxyCODONE (ROXICODONE) immediate release tablet 10 mg: 10 mg | ORAL | @ 01:00:00 | Stop: 2021-03-21

## 2021-03-18 MED ADMIN — insulin lispro (HumaLOG) injection 0-12 Units: 0-12 [IU] | SUBCUTANEOUS | @ 16:00:00

## 2021-03-18 MED ADMIN — polyethylene glycol (MIRALAX) packet 17 g: 17 g | ORAL | @ 13:00:00

## 2021-03-18 MED ADMIN — pancrelipase (Lip-Prot-Amyl) (CREON) 24,000-76,000 -120,000 unit delayed release capsule 288,000 units of lipase: 288000 [IU] | ORAL | @ 16:00:00

## 2021-03-18 MED ADMIN — gabapentin (NEURONTIN) capsule 600 mg: 600 mg | ORAL

## 2021-03-18 MED ADMIN — pediatric multivitamin-vit D3 1,500 unit-vit K 800 mcg (MVW COMPLETE FORMULATION) capsule: 2 | ORAL

## 2021-03-18 MED ADMIN — ketorolac (TORADOL) injection 15 mg: 15 mg | INTRAVENOUS | @ 13:00:00 | Stop: 2021-03-22

## 2021-03-18 MED ADMIN — lamoTRIgine (LaMICtal) tablet 200 mg: 200 mg | ORAL

## 2021-03-18 MED ADMIN — sodium chloride 7% nebulizer solution 4 mL: 4 mL | RESPIRATORY_TRACT | @ 14:00:00

## 2021-03-18 MED ADMIN — piperacillin-tazobactam (ZOSYN) IVPB (premix) 4.5 g: 4.5 g | INTRAVENOUS | @ 09:00:00 | Stop: 2021-03-28

## 2021-03-18 MED ADMIN — albuterol 2.5 mg /3 mL (0.083 %) nebulizer solution 2.5 mg: 2.5 mg | RESPIRATORY_TRACT | @ 02:00:00

## 2021-03-18 MED ADMIN — sodium chloride 7% nebulizer solution 4 mL: 4 mL | RESPIRATORY_TRACT | @ 02:00:00

## 2021-03-18 MED ADMIN — piperacillin-tazobactam (ZOSYN) IVPB (premix) 4.5 g: 4.5 g | INTRAVENOUS | @ 16:00:00 | Stop: 2021-03-28

## 2021-03-18 MED ADMIN — traZODone (DESYREL) tablet 200 mg: 200 mg | ORAL

## 2021-03-18 MED ADMIN — senna (SENOKOT) tablet 2 tablet: 2 | ORAL

## 2021-03-18 MED ADMIN — insulin lispro (HumaLOG) injection 0-12 Units: 0-12 [IU] | SUBCUTANEOUS | @ 22:00:00

## 2021-03-18 MED ADMIN — dornase alfa (PULMOZYME) 1 mg/mL solution 2.5 mg: 2.5 mg | RESPIRATORY_TRACT | @ 14:00:00

## 2021-03-18 MED ADMIN — methocarbamoL (ROBAXIN) tablet 500 mg: 500 mg | ORAL | @ 01:00:00

## 2021-03-18 MED ADMIN — pramipexole (MIRAPEX) tablet 0.25 mg: .25 mg | ORAL | @ 21:00:00

## 2021-03-18 MED ADMIN — gabapentin (NEURONTIN) capsule 600 mg: 600 mg | ORAL | @ 17:00:00

## 2021-03-18 MED ADMIN — montelukast (SINGULAIR) tablet 10 mg: 10 mg | ORAL

## 2021-03-18 MED ADMIN — ketorolac (TORADOL) injection 15 mg: 15 mg | INTRAVENOUS | @ 21:00:00 | Stop: 2021-03-22

## 2021-03-18 MED ADMIN — insulin lispro (HumaLOG) injection 30 Units: 30 [IU] | SUBCUTANEOUS | @ 16:00:00

## 2021-03-18 MED ADMIN — albuterol 2.5 mg /3 mL (0.083 %) nebulizer solution 2.5 mg: 2.5 mg | RESPIRATORY_TRACT | @ 14:00:00

## 2021-03-18 MED ADMIN — insulin lispro (HumaLOG) injection 30 Units: 30 [IU] | SUBCUTANEOUS | @ 13:00:00

## 2021-03-18 NOTE — Unmapped (Signed)
Pt alert and oriented x4. Pt reports pain this shift. PRN oxy given, however, pt reports it only takes the edge off. MD aware and Toradol ordered. Pt aware and med given per order. Pt reports med to be effective.       Problem: Infection  Goal: Absence of Infection Signs and Symptoms  Outcome: Progressing  Intervention: Prevent or Manage Infection  Recent Flowsheet Documentation  Taken 03/17/2021 0800 by Edythe Clarity, RN  Isolation Precautions: spec airborne/contact precautions maintained     Problem: COPD (Chronic Obstructive Pulmonary Disease) Comorbidity  Goal: Maintenance of COPD Symptom Control  Outcome: Progressing     Problem: Hypertension Comorbidity  Goal: Blood Pressure in Desired Range  Outcome: Progressing

## 2021-03-18 NOTE — Unmapped (Signed)
PULMONARY CONSULT  NOTE      Patient: Christian Young([birth year])  Reason for consultation: Christian Young is a 35 y.o. male who is seen in consultation at the request of Rica Koyanagi, MD for comprehensive evaluation of COVID in CF exacerbation.    Assessment and Recommendations:      Principal Problem:    Cystic fibrosis with pulmonary exacerbation (CMS-HCC)  Active Problems:    Essential hypertension    Depressive disorder    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)    Pancreatic insufficiency due to cystic fibrosis (CMS-HCC)    History of Mycobacterium abscessus infection    Chronic deep vein thrombosis (DVT) of lower extremity (CMS-HCC)    Restless leg syndrome    Degeneration of lumbar intervertebral disc  Resolved Problems:    * No resolved hospital problems. *    Christian Young??is a 35 y.o.??male??with PMHx of CF who??presented to [facility] with??Cystic fibrosis with pulmonary exacerbation (CMS-HCC) secondary to COVID.  ??  1. Acute exacerbation of CF bronchiectasis with COVID:   Agree with treatment with Remdesivir per ID recs, appreciate. He is not hypoxemic and overall does not have as much cough/sputum as usual.  He feels unwell although sputum culture still pending. We will see how he does over the next few days to determine how much is COVID related vs some bacterial infection. Prior MSSA and 2 variants of PsA (one pan resistant and one with intermediate resistance).  For now continue Zosyn??4.5 mg q6h, tobramycin??IV, pharmacy to dose, does BID dosing. Expect he will need 10 days. CXR unchanged.   - Continue Trikafta (non-formulary), Breo (formulary alternative for Symbicort) and Pulmozyme.   - Airway clearance with albuterol TID, 7% hypertonic saline, chest vest, and aerobika  ??  2. CFRD:   - Continue current insulin regimen. Glucoses doing well.  ??    We appreciate the opportunity to assist in the care of this patient.  Please page [phone] with any questions.    Darnelle Bos, MD    Subjective:      History of Present Illness:  Christian Young is a 35 y.o. male admitted for cystic fibrosis exacerbation. He has a history of CF ([id number] and [id number] insertion) on trikafta, CFRD on insulin, history of DVT (on xarelto currently), with recent baseline lung function of FEV1 ~72% .  ??  Last week started feeling fatigued with sinus symptoms which is typical of his exacerbations but then started having fevers which is unusual. He home COVID tested several days ago and was negative. He then presented to the ED last night, was COVID positive on the RPR (rapid was negative first).     He has been having chest pain - typical during exacerbations but also whole body aches and pains which is atypical for him.     8/5: Still with lots of fatigue. Had more pain overnight, would like an IV prn once nightly available. ??    8/6: Patient doing well today.  Reports some pain however Toradol helping.    Review of Systems: A comprehensive review of systems was performed and was negative except as above in HPI  Past Medical History:   Diagnosis Date   ??? Anxiety    ??? Chronic pain disorder    ??? Cystic fibrosis (CMS-HCC)    ??? Depression    ??? Hypertension    ??? Lumbar radiculopathy 10/26/2020   ??? Nonproductive cough 04/05/2018     Past Surgical History:  Procedure Laterality Date   ??? IR INSERT PORT AGE GREATER THAN 5 YRS  03/27/2019    IR INSERT PORT AGE GREATER THAN 5 YRS 03/27/2019 Rush Barer, MD IMG VIR [facility]   ??? PR REMOVAL OF LUNG,LOBECTOMY Right 03/29/2017    Procedure: REMOVAL OF LUNG, OTHER THAN PNEUMONECTOMY; SINGLE LOBE (LOBECTOMY);  Surgeon: Cherie Dark, MD;  Location: MAIN OR [facility];  Service: Thoracic     Medications reviewed in Epic  Allergies as of 03/13/2021 - Reviewed 03/13/2021   Allergen Reaction Noted   ??? Aztreonam Anaphylaxis, Hives, Nausea And Vomiting, and Rash 09/06/2015   ??? [place] [aztreonam lysine] Anaphylaxis 12/27/2016   ??? Cefepime Itching, Nausea Only, and Other (See Comments) 09/06/2015   ??? Other Anaphylaxis and Other (See Comments) 09/06/2015   ??? Slo-bid 100 Anaphylaxis 06/20/2017   ??? Tobramycin Tinnitus 09/06/2015   ??? Banana Itching and Nausea And Vomiting 07/29/2016     Family History   Problem Relation Age of Onset   ??? Bipolar disorder Mother    ??? Depression Mother      Social History     Tobacco Use   ??? Smoking status: Former Smoker     Types: e-Cigarettes   ??? Smokeless tobacco: Never Used   Substance Use Topics   ??? Alcohol use: Not Currently     Alcohol/week: 3.0 standard drinks     Types: 3 Glasses of wine per week        Objective:      Physical Exam:  Vitals:    03/17/21 2000 03/18/21 0456 03/18/21 0839 03/18/21 1155   BP: 118/81 105/72 118/78    Pulse: 88 82  86   Resp: 18 18  18    Temp: 37 ??C (98.6 ??F) 36.5 ??C (97.7 ??F)  36.8 ??C (98.2 ??F)   TempSrc: Oral Oral  Oral   SpO2: 94% 95%  99%   Weight:       Height:         Gen: Patient awake, alert, oriented to time place and person, no pallor, icterus,  cyanosis or clubbing.  Eyes: Pupils equal round and reacting light bilaterally  Head neck ENT: No JVD, no lymphadenopathy-cervical or supraclavicular, no thyromegaly, moist mucosa, normal oropharynx  CVS: S1-S2 heard normally, no murmurs appreciated, no rubs or gallops  Respiratory: Air entry equal bilaterally without any crackles or wheeze  Abdomen: Soft, nontender, nondistended, no organomegaly, bowel sounds present  Neuro: Nonfocal neurological exam, no sensory deficits, cranial nerves grossly intact  Skin extremities: No rash, no pedal edema    Malnutrition Assessment by RD:          Diagnostic Review:   All labs and images were personally reviewed.

## 2021-03-18 NOTE — Unmapped (Signed)
Pt alert and orientedx4. Pain managed with prn oxy and toradol. Pt in NAD VSS. Independent of care. Pt receiving IVABX per order. Remdesivir given this shift. Pt tolerated well. Pulm continues to follow       Problem: Infection  Goal: Absence of Infection Signs and Symptoms  Outcome: Progressing  Intervention: Prevent or Manage Infection  Recent Flowsheet Documentation  Taken 03/18/2021 0800 by Edythe Clarity, RN  Isolation Precautions: spec airborne/contact precautions maintained     Problem: Infection (Cystic Fibrosis)  Goal: Absence of Infection Signs and Symptoms  Outcome: Progressing  Intervention: Manage Infection and Prevent Transmission  Recent Flowsheet Documentation  Taken 03/18/2021 0800 by Edythe Clarity, RN  Isolation Precautions: spec airborne/contact precautions maintained     Problem: Oral Intake Inadequate (Cystic Fibrosis)  Goal: Optimal Nutrition Intake  Outcome: Progressing     Problem: Malabsorption (Cystic Fibrosis)  Goal: Optimal Bowel Elimination  Outcome: Progressing     Problem: Respiratory Compromise (Cystic Fibrosis)  Goal: Effective Oxygenation and Ventilation  Outcome: Progressing     Problem: COPD (Chronic Obstructive Pulmonary Disease) Comorbidity  Goal: Maintenance of COPD Symptom Control  Outcome: Resolved     Problem: Hypertension Comorbidity  Goal: Blood Pressure in Desired Range  Outcome: Resolved     Problem: Impaired Wound Healing  Goal: Optimal Wound Healing  Outcome: Resolved     Problem: Adjustment to Illness (Cystic Fibrosis)  Goal: Optimal Coping  Outcome: Resolved

## 2021-03-18 NOTE — Unmapped (Signed)
[facility] Hospitalist Daily Progress Note     LOS: 4 days     Assessment/Plan:  Principal Problem:    Cystic fibrosis with pulmonary exacerbation (CMS-HCC)  Active Problems:    Essential hypertension    Depressive disorder    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)    Pancreatic insufficiency due to cystic fibrosis (CMS-HCC)    History of Mycobacterium abscessus infection    Chronic deep vein thrombosis (DVT) of lower extremity (CMS-HCC)    Restless leg syndrome    Degeneration of lumbar intervertebral disc  Resolved Problems:    * No resolved hospital problems. *         Christian Young is a 35 y.o. male who presented to [facility] with Cystic fibrosis with pulmonary exacerbation (CMS-HCC).    CF with exacerbation:   He has a bacterial cystic fibrosis exacerbation with coincident COVID infection.  - Pulmonology Medicine following    - FU CF sputum culture, AFB culture (has previously grown nontuberculous Mycobacterium)  - Audiology consult. Has a history of tinnitus with tobramycin)  - Zosyn (4.5 mg q6 hours) and IV tobramycin pharmacy to dose  - PFT - repeat testing per Pulmonary Medicine   - Airway clearance with vest and aerobika TID, albuterol  TID, HTS TID, pulmozyme, symbicort formulary sub  -INR 1.2 yesterday, prothrombin time 14.0 seconds, slightly elevated.  Will dose vitamin K using the following as guidelines: if PT/INR normal on admission start 5 mg PO vitamin K twice a week while on IV antibiotics; if PT/INR elevated on admission start 5 mg PO vitamin K daily and recheck in 5 days - if normalized switch to twice a week while on IV antibiotics, if still elevated continue daily while on IV antibiotics  -He is currently on a pediatric multivitamin with 800 mcg of vitamin K 2 times a day.  We will continue this and follow INR.  - cont home enzymes and vitamins  - cont home trikafta (pt brought home supply with him)      Acute hypoxemic respiratory failure due to COVID-19 infection with Pneumonia  ID consulted 8/4. Per ID this is the last stage  of the infection, will treat with 3 days of Remdesivir given underlying lung disease, last day 8/6.         Date of symptom onset: Approximately 1 week prior to admission.         Date of positive SARS-CoV-2 PCR: 03/14/2021       Exposure: Unknown, wife is an EMT, she is asymptomatic.        Vaccination status: Vaccinated, history below.  COVID-19 Vaccine History     Name Date Dose Route    COVID-19 VACC,MRNA,(PFIZER)(PF)(IM) 07/11/2020  1:46 PM 0.3 mL Intramuscular    Site: Right deltoid    COVID-19 VACC,MRNA,(PFIZER)(PF)(IM) 11/21/2019 0.3 mL --    External: Historical information - from other registry    COVID-19 VACC,MRNA,(PFIZER)(PF)(IM) 10/31/2019 0.3 mL Intramuscular    Site: Left deltoid    External: Historical information - from other registry              Therapy: Remdesivir  for 3 days.        Oxygen: Room air   - Maintain SpO2 92-96%, he did has normal oxygen saturations on room air.  - If indicated, Daily labs: CBC, BMP (add hepatic fx panel if on Remdesevir)  - Monitor q48h-72h CRP, D-dimer, LDH      Hypercoagulable state in the setting of  COVID-19:   Patient is at increased risk for thrombosis in the setting of acute COVID-19 infection, already on DOAC therapy given history of DVT.   - Patient is not on oxygen and should receive standard VTE pharmacologic prophylaxis.  - Xarelto 20 qd, Home dose and frequency    Advanced care planning  Goals of Care OR Palliative care consult (Page [phone]) if clinical worsening and escalation to ICU expected. Co-morbidities that may increase risk of critical illness include advanced age, obesity, HTN, DM, conditions causing chronic organ failure, or immunocompromising conditions.   - Code status: FULL CODE  - Healthcare proxy: Wife       Other Medical Issues:  CFRD on insulin:   Follows with [facility], has CGM device. Glycemic control excellent since admit.   - Lantus 50 qhs  - Lispro 30 units TID AC  - SSI Standard Scale   ??  H/o DVT:  - Xarelto 20 qd  ??  Depression:   - Fluoxetine, Melatonin, Trazodone  ??  HTN:  - Lisinopril 10 qd  ??  Chronic Low back pain   Continue home chronic opiate therapy.  He has been tapered off intravenous opiates.    - Robaxan prn   - Oxycodone 5-10 milligrams p.o. every 4 hours prn  - Gabapentin 600 TID  -Continue prn toradol for pain.     ??  RLS:   - Pramipexole      DVT prophylaxis. Xarelto      Disposition: Floor.  Could DC home with home health at some point, however he refuses home health.      Please page the [facility]) pager at [phone] with questions.      Pending labs:   Pending Labs     Order Current Status    AFB culture In process    Blood Culture #1 Preliminary result    Blood Culture #2 Preliminary result    CF Sputum/ CF Sinus Culture Preliminary result          Subjective:   No complaint of fever, chills.  Breathing is at baseline.    Objective:   Physical Exam:    GEN: NAD, supine in bed, watching TV.  Resting comfortably.  HEENT: Mucous membranes moist, EOMI, no scleral icterus.  RESPIRATORY: No increased work of breathing.  Lungs are clear to auscultation.  CARDIOVASCULAR: Regular rate and rhythm without murmur gallop or rub.  ABDOMEN: Soft, nontender, nondistended without palpated  organomegaly  NEUROLOGIC: Alert, oriented, mental status normal to conversation.  Skin: Warm, dry, no acute rashes on visualized area.    Vital signs in last 24 hours:  Temp:  [36.5 ??C (97.7 ??F)-37 ??C (98.6 ??F)] 36.8 ??C (98.2 ??F)  Heart Rate:  [82-88] 86  Resp:  [18] 18  BP: (105-118)/(72-81) 118/78  MAP (mmHg):  [81-92] 81  SpO2:  [94 %-99 %] 99 %    Intake/Output last 24 hours:  No intake or output data in the 24 hours ending 03/18/21 1248    Medications:   Scheduled Meds:  ??? albuterol  2.5 mg Nebulization Q6H While awake (RT)   ??? atorvastatin  20 mg Oral Nightly   ??? cetirizine  10 mg Oral Daily   ??? dornase alfa  2.5 mg Inhalation Daily   ??? elexacaftor-tezacaftor-ivacaft  2 tablet Oral Q AM    And   ??? elexacaftor-tezacaftor-ivacaft  1 tablet Oral Q PM   ??? FLUoxetine  60 mg Oral Daily   ???  fluticasone furoate-vilanteroL  1 puff Inhalation Daily (RT)   ??? fluticasone propionate  1 spray Each Nare Daily   ??? gabapentin  600 mg Oral TID   ??? insulin glargine  50 Units Subcutaneous Nightly   ??? insulin lispro  0-12 Units Subcutaneous ACHS   ??? insulin lispro  30 Units Subcutaneous TID AC   ??? lamoTRIgine  200 mg Oral BID   ??? lisinopriL  10 mg Oral Daily   ??? melatonin  10.5 mg Oral Nightly   ??? montelukast  10 mg Oral Nightly   ??? pancrelipase (Lip-Prot-Amyl)  288,000 units of lipase Oral 3xd Meals   ??? pantoprazole  20 mg Oral Daily   ??? MVW Complete (pediatric multivit 61-D3-vit K)  2 capsule Oral BID   ??? piperacillin-tazobactam (ZOSYN) IV (intermittent)  4.5 g Intravenous Q6H SCH   ??? polyethylene glycol  17 g Oral Daily   ??? pramipexole  0.25 mg Oral Q PM   ??? rivaroxaban  20 mg Oral Daily   ??? senna  2 tablet Oral Nightly   ??? sodium chloride 7%  4 mL Nebulization Q6H While awake (RT)   ??? tobramycin (NEBCIN) IVPB (conventional dosing)  260 mg Intravenous Q12H   ??? traZODone  200 mg Oral Nightly     Continuous Infusions:  ??? sodium chloride 30 mL/hr (03/14/21 0508)       Boydstun Citron MD  [facility].

## 2021-03-18 NOTE — Unmapped (Signed)
Patient stable this shift, all covid + protocols adhered to, patient rested all night   Problem: Adult Inpatient Plan of Care  Goal: Plan of Care Review  Outcome: Progressing  Goal: Patient-Specific Goal (Individualized)  Outcome: Progressing  Goal: Absence of Hospital-Acquired Illness or Injury  Outcome: Progressing  Goal: Optimal Comfort and Wellbeing  Outcome: Progressing  Goal: Readiness for Transition of Care  Outcome: Progressing  Goal: Rounds/Family Conference  Outcome: Progressing     Problem: Infection  Goal: Absence of Infection Signs and Symptoms  Outcome: Progressing     Problem: COPD (Chronic Obstructive Pulmonary Disease) Comorbidity  Goal: Maintenance of COPD Symptom Control  Outcome: Progressing     Problem: Hypertension Comorbidity  Goal: Blood Pressure in Desired Range  Outcome: Progressing     Problem: Impaired Wound Healing  Goal: Optimal Wound Healing  Outcome: Progressing     Problem: Adjustment to Illness (Cystic Fibrosis)  Goal: Optimal Coping  Outcome: Progressing     Problem: Infection (Cystic Fibrosis)  Goal: Absence of Infection Signs and Symptoms  Outcome: Progressing     Problem: Oral Intake Inadequate (Cystic Fibrosis)  Goal: Optimal Nutrition Intake  Outcome: Progressing     Problem: Malabsorption (Cystic Fibrosis)  Goal: Optimal Bowel Elimination  Outcome: Progressing     Problem: Respiratory Compromise (Cystic Fibrosis)  Goal: Effective Oxygenation and Ventilation  Outcome: Progressing

## 2021-03-19 MED ADMIN — elexacaftor-tezacaftor-ivacaft (TRIKAFTA) one (1) BLUE tablet (PATIENT'S OWN SUPPLY): 1 | ORAL | @ 22:00:00

## 2021-03-19 MED ADMIN — ketorolac (TORADOL) injection 15 mg: 15 mg | INTRAVENOUS | @ 04:00:00 | Stop: 2021-03-22

## 2021-03-19 MED ADMIN — polyethylene glycol (MIRALAX) packet 17 g: 17 g | ORAL | @ 15:00:00

## 2021-03-19 MED ADMIN — pancrelipase (Lip-Prot-Amyl) (CREON) 24,000-76,000 -120,000 unit delayed release capsule 288,000 units of lipase: 288000 [IU] | ORAL | @ 18:00:00

## 2021-03-19 MED ADMIN — gabapentin (NEURONTIN) capsule 600 mg: 600 mg | ORAL | @ 18:00:00

## 2021-03-19 MED ADMIN — sodium chloride 7% nebulizer solution 4 mL: 4 mL | RESPIRATORY_TRACT | @ 14:00:00

## 2021-03-19 MED ADMIN — lamoTRIgine (LaMICtal) tablet 200 mg: 200 mg | ORAL | @ 15:00:00

## 2021-03-19 MED ADMIN — piperacillin-tazobactam (ZOSYN) IVPB (premix) 4.5 g: 4.5 g | INTRAVENOUS | @ 04:00:00 | Stop: 2021-03-28

## 2021-03-19 MED ADMIN — methocarbamoL (ROBAXIN) tablet 500 mg: 500 mg | ORAL

## 2021-03-19 MED ADMIN — traZODone (DESYREL) tablet 200 mg: 200 mg | ORAL

## 2021-03-19 MED ADMIN — insulin lispro (HumaLOG) injection 30 Units: 30 [IU] | SUBCUTANEOUS | @ 15:00:00

## 2021-03-19 MED ADMIN — piperacillin-tazobactam (ZOSYN) IVPB (premix) 4.5 g: 4.5 g | INTRAVENOUS | @ 18:00:00 | Stop: 2021-03-28

## 2021-03-19 MED ADMIN — fluticasone propionate (FLONASE) 50 mcg/actuation nasal spray 1 spray: 1 | NASAL | @ 15:00:00

## 2021-03-19 MED ADMIN — FLUoxetine (PROzac) capsule 60 mg: 60 mg | ORAL | @ 15:00:00

## 2021-03-19 MED ADMIN — senna (SENOKOT) tablet 2 tablet: 2 | ORAL

## 2021-03-19 MED ADMIN — rivaroxaban (XARELTO) tablet 20 mg: 20 mg | ORAL | @ 15:00:00

## 2021-03-19 MED ADMIN — pancrelipase (Lip-Prot-Amyl) (CREON) 24,000-76,000 -120,000 unit delayed release capsule 288,000 units of lipase: 288000 [IU] | ORAL | @ 12:00:00

## 2021-03-19 MED ADMIN — lamoTRIgine (LaMICtal) tablet 200 mg: 200 mg | ORAL

## 2021-03-19 MED ADMIN — sodium chloride 7% nebulizer solution 4 mL: 4 mL | RESPIRATORY_TRACT | @ 20:00:00

## 2021-03-19 MED ADMIN — oxyCODONE (ROXICODONE) immediate release tablet 10 mg: 10 mg | ORAL | @ 21:00:00 | Stop: 2021-03-21

## 2021-03-19 MED ADMIN — ketorolac (TORADOL) injection 15 mg: 15 mg | INTRAVENOUS | @ 10:00:00 | Stop: 2021-03-22

## 2021-03-19 MED ADMIN — insulin lispro (HumaLOG) injection 0-12 Units: 0-12 [IU] | SUBCUTANEOUS | @ 22:00:00

## 2021-03-19 MED ADMIN — piperacillin-tazobactam (ZOSYN) IVPB (premix) 4.5 g: 4.5 g | INTRAVENOUS | Stop: 2021-03-28

## 2021-03-19 MED ADMIN — cetirizine (ZyrTEC) tablet 10 mg: 10 mg | ORAL | @ 15:00:00

## 2021-03-19 MED ADMIN — ketorolac (TORADOL) injection 15 mg: 15 mg | INTRAVENOUS | @ 15:00:00 | Stop: 2021-03-22

## 2021-03-19 MED ADMIN — elexacaftor-tezacaftor-ivacaft (TRIKAFTA) 2 ORANGE tablets (PATIENT'S OWN SUPPLY): 2 | ORAL | @ 15:00:00

## 2021-03-19 MED ADMIN — fluticasone furoate-vilanteroL (BREO ELLIPTA) 200-25 mcg/dose inhaler 1 puff: 1 | RESPIRATORY_TRACT | @ 14:00:00

## 2021-03-19 MED ADMIN — insulin glargine (LANTUS) injection 50 Units: 50 [IU] | SUBCUTANEOUS

## 2021-03-19 MED ADMIN — pantoprazole (PROTONIX) EC tablet 20 mg: 20 mg | ORAL | @ 15:00:00

## 2021-03-19 MED ADMIN — piperacillin-tazobactam (ZOSYN) IVPB (premix) 4.5 g: 4.5 g | INTRAVENOUS | @ 10:00:00 | Stop: 2021-03-28

## 2021-03-19 MED ADMIN — pediatric multivitamin-vit D3 1,500 unit-vit K 800 mcg (MVW COMPLETE FORMULATION) capsule: 2 | ORAL | @ 15:00:00

## 2021-03-19 MED ADMIN — oxyCODONE (ROXICODONE) immediate release tablet 10 mg: 10 mg | ORAL | @ 08:00:00 | Stop: 2021-03-21

## 2021-03-19 MED ADMIN — pramipexole (MIRAPEX) tablet 0.25 mg: .25 mg | ORAL | @ 22:00:00

## 2021-03-19 MED ADMIN — oxyCODONE (ROXICODONE) immediate release tablet 10 mg: 10 mg | ORAL | Stop: 2021-03-21

## 2021-03-19 MED ADMIN — pediatric multivitamin-vit D3 1,500 unit-vit K 800 mcg (MVW COMPLETE FORMULATION) capsule: 2 | ORAL

## 2021-03-19 MED ADMIN — methocarbamoL (ROBAXIN) tablet 500 mg: 500 mg | ORAL | @ 08:00:00

## 2021-03-19 MED ADMIN — albuterol 2.5 mg /3 mL (0.083 %) nebulizer solution 2.5 mg: 2.5 mg | RESPIRATORY_TRACT | @ 20:00:00

## 2021-03-19 MED ADMIN — atorvastatin (LIPITOR) tablet 20 mg: 20 mg | ORAL

## 2021-03-19 MED ADMIN — tobramycin (NEBCIN) 260 mg in sodium chloride (NS) 0.9 % 100 mL IVPB: 260 mg | INTRAVENOUS | @ 09:00:00 | Stop: 2021-03-28

## 2021-03-19 MED ADMIN — lisinopriL (PRINIVIL,ZESTRIL) tablet 10 mg: 10 mg | ORAL | @ 15:00:00

## 2021-03-19 MED ADMIN — insulin lispro (HumaLOG) injection 30 Units: 30 [IU] | SUBCUTANEOUS | @ 22:00:00

## 2021-03-19 MED ADMIN — dornase alfa (PULMOZYME) 1 mg/mL solution 2.5 mg: 2.5 mg | RESPIRATORY_TRACT | @ 14:00:00

## 2021-03-19 MED ADMIN — montelukast (SINGULAIR) tablet 10 mg: 10 mg | ORAL

## 2021-03-19 MED ADMIN — tobramycin (NEBCIN) 260 mg in sodium chloride (NS) 0.9 % 100 mL IVPB: 260 mg | INTRAVENOUS | @ 23:00:00 | Stop: 2021-03-28

## 2021-03-19 MED ADMIN — sodium chloride 7% nebulizer solution 4 mL: 4 mL | RESPIRATORY_TRACT | @ 02:00:00

## 2021-03-19 MED ADMIN — albuterol 2.5 mg /3 mL (0.083 %) nebulizer solution 2.5 mg: 2.5 mg | RESPIRATORY_TRACT | @ 14:00:00

## 2021-03-19 MED ADMIN — albuterol 2.5 mg /3 mL (0.083 %) nebulizer solution 2.5 mg: 2.5 mg | RESPIRATORY_TRACT | @ 02:00:00

## 2021-03-19 MED ADMIN — gabapentin (NEURONTIN) capsule 600 mg: 600 mg | ORAL

## 2021-03-19 MED ADMIN — gabapentin (NEURONTIN) capsule 600 mg: 600 mg | ORAL | @ 15:00:00

## 2021-03-19 MED ADMIN — melatonin tablet 10.5 mg: 10 mg | ORAL

## 2021-03-19 NOTE — Unmapped (Signed)
Pt has received all scheduled inhaled medications today with [place] for airway clearance.  Dry, unproductive cough.  BBS clear/slightly diminished.  Stable no RA.  Will continue to follow.

## 2021-03-19 NOTE — Unmapped (Signed)
Pt with no acute distress this shift.Pain controlled appropriately without any complaints.Has been compliant with his treatment regimen.Fall precautionary measures implemented.Encouraged to call for help when in need.Will keep monitoring.  Problem: Adult Inpatient Plan of Care  Goal: Plan of Care Review  Outcome: Progressing  Goal: Patient-Specific Goal (Individualized)  Outcome: Progressing  Goal: Absence of Hospital-Acquired Illness or Injury  Outcome: Progressing  Intervention: Identify and Manage Fall Risk  Recent Flowsheet Documentation  Taken 03/18/2021 1954 by Wonda Olds, RN  Safety Interventions:  ??? fall reduction program maintained  ??? isolation precautions  ??? lighting adjusted for tasks/safety  ??? low bed  Intervention: Prevent and Manage VTE (Venous Thromboembolism) Risk  Recent Flowsheet Documentation  Taken 03/18/2021 1954 by Wonda Olds, RN  Activity Management: activity adjusted per tolerance  Intervention: Prevent Infection  Recent Flowsheet Documentation  Taken 03/18/2021 1954 by Wonda Olds, RN  Infection Prevention:  ??? equipment surfaces disinfected  ??? hand hygiene promoted  ??? personal protective equipment utilized  Goal: Optimal Comfort and Wellbeing  Outcome: Progressing  Goal: Readiness for Transition of Care  Outcome: Progressing  Goal: Rounds/Family Conference  Outcome: Progressing     Problem: Infection  Goal: Absence of Infection Signs and Symptoms  Outcome: Progressing  Intervention: Prevent or Manage Infection  Recent Flowsheet Documentation  Taken 03/18/2021 1954 by Wonda Olds, RN  Infection Management: aseptic technique maintained  Isolation Precautions: spec airborne/contact precautions maintained     Problem: Impaired Wound Healing  Goal: Optimal Wound Healing  Intervention: Promote Wound Healing  Recent Flowsheet Documentation  Taken 03/18/2021 1954 by Wonda Olds, RN  Activity Management: activity adjusted per tolerance     Problem: Infection (Cystic Fibrosis)  Goal: Absence of Infection Signs and Symptoms  Outcome: Progressing  Intervention: Manage Infection and Prevent Transmission  Recent Flowsheet Documentation  Taken 03/18/2021 1954 by Wonda Olds, RN  Infection Management: aseptic technique maintained  Isolation Precautions: spec airborne/contact precautions maintained     Problem: Oral Intake Inadequate (Cystic Fibrosis)  Goal: Optimal Nutrition Intake  Outcome: Progressing     Problem: Respiratory Compromise (Cystic Fibrosis)  Goal: Effective Oxygenation and Ventilation  Outcome: Progressing

## 2021-03-19 NOTE — Unmapped (Signed)
PULMONARY CONSULT  NOTE      Patient: Christian Young(1985)  Reason for consultation: Christian Young is a 35 y.o. male who is seen in consultation at the request of Rica Koyanagi, MD for comprehensive evaluation of COVID in CF exacerbation.    Assessment and Recommendations:      Principal Problem:    Cystic fibrosis with pulmonary exacerbation (CMS-HCC)  Active Problems:    Essential hypertension    Depressive disorder    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)    Pancreatic insufficiency due to cystic fibrosis (CMS-HCC)    History of Mycobacterium abscessus infection    Chronic deep vein thrombosis (DVT) of lower extremity (CMS-HCC)    Restless leg syndrome    Degeneration of lumbar intervertebral disc  Resolved Problems:    * No resolved hospital problems. *    Christian Young??is a 35 y.o.??male??with PMHx of CF who??presented to [place] with??Cystic fibrosis with pulmonary exacerbation (CMS-HCC) secondary to COVID.  ??  1. Acute exacerbation of CF bronchiectasis with COVID:   Agree with treatment with Remdesivir per ID recs, appreciate. He is not hypoxemic and overall does not have as much cough/sputum as usual.  He feels unwell although sputum culture still pending. We will see how he does over the next few days to determine how much is COVID related vs some bacterial infection. Prior MSSA and 2 variants of PsA (one pan resistant and one with intermediate resistance).  For now continue Zosyn??4.5 mg q6h, tobramycin??IV, pharmacy to dose, does BID dosing. Expect he will need 10 days (last day 03/22/21). CXR unchanged.   - Continue Trikafta (non-formulary), Breo (formulary alternative for Symbicort) and Pulmozyme.   - Airway clearance with albuterol TID, 7% hypertonic saline, chest vest, and aerobika  ??  2. CFRD:   - Continue current insulin regimen. Glucoses doing well.  ??    We appreciate the opportunity to assist in the care of this patient.  Please page [phone] with any questions.    Darnelle Bos, MD    Subjective:      History of Present Illness:  Christian Young is a 35 y.o. male admitted for cystic fibrosis exacerbation. He has a history of CF ([id number] and [id number] insertion) on trikafta, CFRD on insulin, history of DVT (on xarelto currently), with recent baseline lung function of FEV1 ~72% .  ??  Last week started feeling fatigued with sinus symptoms which is typical of his exacerbations but then started having fevers which is unusual. He home COVID tested several days ago and was negative. He then presented to the ED last night, was COVID positive on the RPR (rapid was negative first).     He has been having chest pain - typical during exacerbations but also whole body aches and pains which is atypical for him.     8/5: Still with lots of fatigue. Had more pain overnight, would like an IV prn once nightly available. ??    8/6: Patient doing well today.  Reports some pain however Toradol helping.    8/7: Notes that he is doing better, pain better controlled.  Slept well without any difficulties.  No cough, no runny nose anymore.    Review of Systems: A comprehensive review of systems was performed and was negative except as above in HPI  Past Medical History:   Diagnosis Date   ??? Anxiety    ??? Chronic pain disorder    ??? Cystic fibrosis (CMS-HCC)    ???  Depression    ??? Hypertension    ??? Lumbar radiculopathy 10/26/2020   ??? Nonproductive cough 04/05/2018     Past Surgical History:   Procedure Laterality Date   ??? IR INSERT PORT AGE GREATER THAN 5 YRS  03/27/2019    IR INSERT PORT AGE GREATER THAN 5 YRS 03/27/2019 Rush Barer, MD IMG VIR [facility]   ??? PR REMOVAL OF LUNG,LOBECTOMY Right 03/29/2017    Procedure: REMOVAL OF LUNG, OTHER THAN PNEUMONECTOMY; SINGLE LOBE (LOBECTOMY);  Surgeon: Cherie Dark, MD;  Location: MAIN OR [facility];  Service: Thoracic     Medications reviewed in Epic  Allergies as of 03/13/2021 - Reviewed 03/13/2021   Allergen Reaction Noted   ??? Aztreonam Anaphylaxis, Hives, Nausea And Vomiting, and Rash 09/06/2015   ??? [place] [aztreonam lysine] Anaphylaxis 12/27/2016   ??? Cefepime Itching, Nausea Only, and Other (See Comments) 09/06/2015   ??? Other Anaphylaxis and Other (See Comments) 09/06/2015   ??? Slo-bid 100 Anaphylaxis 06/20/2017   ??? Tobramycin Tinnitus 09/06/2015   ??? Banana Itching and Nausea And Vomiting 07/29/2016     Family History   Problem Relation Age of Onset   ??? Bipolar disorder Mother    ??? Depression Mother      Social History     Tobacco Use   ??? Smoking status: Former Smoker     Types: e-Cigarettes   ??? Smokeless tobacco: Never Used   Substance Use Topics   ??? Alcohol use: Not Currently     Alcohol/week: 3.0 standard drinks     Types: 3 Glasses of wine per week        Objective:      Physical Exam:  Vitals:    03/18/21 2156 03/19/21 0400 03/19/21 1011 03/19/21 1100   BP:  120/65  128/80   Pulse: 83 70 79 97   Resp: 18 16 16 16    Temp:  36.5 ??C (97.7 ??F)  36.9 ??C (98.4 ??F)   TempSrc:  Oral     SpO2: 96% 98% 99% 98%   Weight:       Height:         Gen: Patient awake, alert, oriented to time place and person, no pallor, icterus,  cyanosis or clubbing.  Eyes: Pupils equal round and reacting light bilaterally  Head neck ENT: No JVD, no lymphadenopathy-cervical or supraclavicular, no thyromegaly, moist mucosa, normal oropharynx  CVS: S1-S2 heard normally, no murmurs appreciated, no rubs or gallops  Respiratory: Air entry equal bilaterally without any crackles or wheeze  Abdomen: Soft, nontender, nondistended, no organomegaly, bowel sounds present  Neuro: Nonfocal neurological exam, no sensory deficits, cranial nerves grossly intact  Skin extremities: Scaling of skin noted on bilateral shoulders, no itching, no rash, no pedal edema       Diagnostic Review:   All labs and images were personally reviewed.

## 2021-03-19 NOTE — Unmapped (Signed)
Patient shows no s/s of covid; got up to shower today; on board with plan of care.        Problem: Adult Inpatient Plan of Care  Goal: Plan of Care Review  Outcome: Progressing  Goal: Patient-Specific Goal (Individualized)  Outcome: Progressing  Goal: Absence of Hospital-Acquired Illness or Injury  Outcome: Progressing  Goal: Optimal Comfort and Wellbeing  Outcome: Progressing  Goal: Readiness for Transition of Care  Outcome: Progressing  Goal: Rounds/Family Conference  Outcome: Progressing     Problem: Infection  Goal: Absence of Infection Signs and Symptoms  Outcome: Progressing     Problem: Infection (Cystic Fibrosis)  Goal: Absence of Infection Signs and Symptoms  Outcome: Progressing     Problem: Respiratory Compromise (Cystic Fibrosis)  Goal: Effective Oxygenation and Ventilation  Outcome: Progressing     Problem: Airway Clearance Ineffective  Goal: Effective Airway Clearance  Outcome: Progressing     Problem: Breathing Pattern Ineffective  Goal: Effective Breathing Pattern  Outcome: Progressing     Problem: Gas Exchange Impaired  Goal: Optimal Gas Exchange  Outcome: Progressing

## 2021-03-19 NOTE — Unmapped (Signed)
Problem: Infection (Cystic Fibrosis)  Goal: Absence of Infection Signs and Symptoms  03/18/2021 2152 by Verdell Face, RRT  Outcome: Ongoing - Unchanged  03/18/2021 2151 by Verdell Face, RRT  Outcome: Ongoing - Unchanged     Problem: Respiratory Compromise (Cystic Fibrosis)  Goal: Effective Oxygenation and Ventilation  03/18/2021 2152 by Verdell Face, RRT  Outcome: Ongoing - Unchanged  03/18/2021 2151 by Verdell Face, RRT  Outcome: Ongoing - Unchanged  Intervention: Promote Airway Secretion Clearance  Recent Flowsheet Documentation  Taken 03/18/2021 2145 by Verdell Face, RRT  Breathing Techniques/Airway Clearance: deep/controlled cough encouraged     Problem: Airway Clearance Ineffective  Goal: Effective Airway Clearance  Outcome: Ongoing - Unchanged  Intervention: Promote Airway Secretion Clearance  Recent Flowsheet Documentation  Taken 03/18/2021 2145 by Verdell Face, RRT  Breathing Techniques/Airway Clearance: deep/controlled cough encouraged     Problem: Breathing Pattern Ineffective  Goal: Effective Breathing Pattern  Outcome: Ongoing - Unchanged  Intervention: Promote Improved Breathing Pattern  Recent Flowsheet Documentation  Taken 03/18/2021 2145 by Verdell Face, RRT  Breathing Techniques/Airway Clearance: deep/controlled cough encouraged     Problem: Gas Exchange Impaired  Goal: Optimal Gas Exchange  Outcome: Ongoing - Unchanged        All neb tx & airway clearance therapies given as scheduled w/o adverse reaction. No respiratory distress noted

## 2021-03-19 NOTE — Unmapped (Signed)
[place] Hospitalist Daily Progress Note     LOS: 5 days     Assessment/Plan:  Principal Problem:    Cystic fibrosis with pulmonary exacerbation (CMS-HCC)  Active Problems:    Essential hypertension    Depressive disorder    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)    Pancreatic insufficiency due to cystic fibrosis (CMS-HCC)    History of Mycobacterium abscessus infection    Chronic deep vein thrombosis (DVT) of lower extremity (CMS-HCC)    Restless leg syndrome    Degeneration of lumbar intervertebral disc  Resolved Problems:    * No resolved hospital problems. *         Christian Young is a 35 y.o. male who presented to [facility] with Cystic fibrosis with pulmonary exacerbation (CMS-HCC).    CF with exacerbation:   He has a bacterial cystic fibrosis exacerbation with coincident COVID infection.  - Pulmonology Medicine following    - FU CF sputum culture, AFB culture (has previously grown nontuberculous Mycobacterium)  - Audiology consult. Has a history of tinnitus with tobramycin)  - Zosyn (4.5 mg q6 hours) and IV tobramycin pharmacy to dose  - PFT - repeat testing per Pulmonary Medicine, discussed if we should get these before he leaves, he has 3 more days of IV antibiotics after today to complete 10 days.  - Airway clearance with vest and aerobika TID, albuterol  TID, HTS TID, pulmozyme, symbicort formulary sub  -INR 1.2 8/5, prothrombin time 14.0 seconds, slightly elevated.  Will dose vitamin K using the following as guidelines: if PT/INR normal on admission start 5 mg PO vitamin K twice a week while on IV antibiotics; if PT/INR elevated on admission start 5 mg PO vitamin K daily and recheck in 5 days - if normalized switch to twice a week while on IV antibiotics, if still elevated continue daily while on IV antibiotics  -He is currently on a pediatric multivitamin with 800 mcg of vitamin K 2 times a day.  We will continue this and follow INR.  - cont home enzymes and vitamins  - cont home trikafta (pt brought home supply with him)      Acute hypoxemic respiratory failure due to COVID-19 infection with Pneumonia  ID consulted 8/4. Per ID this is the last stage  of the infection, will treat with 3 days of Remdesivir given underlying lung disease, last day was 8/6.         Date of symptom onset: Approximately 1 week prior to admission.         Date of positive SARS-CoV-2 PCR: 03/14/2021       Exposure: Unknown, wife is an EMT, she is asymptomatic.        Vaccination status: Vaccinated, history below.  COVID-19 Vaccine History     Name Date Dose Route    COVID-19 VACC,MRNA,(PFIZER)(PF)(IM) 07/11/2020  1:46 PM 0.3 mL Intramuscular    Site: Right deltoid    COVID-19 VACC,MRNA,(PFIZER)(PF)(IM) 11/21/2019 0.3 mL --    External: Historical information - from other registry    COVID-19 VACC,MRNA,(PFIZER)(PF)(IM) 10/31/2019 0.3 mL Intramuscular    Site: Left deltoid    External: Historical information - from other registry              Oxygen: Room air   - Maintain SpO2 92-96%, he did has normal oxygen saturations on room air.  - If indicated, Daily labs: CBC, BMP (add hepatic fx panel if on Remdesevir)  - Monitor q48h-72h CRP, D-dimer, LDH as  clinically indicated      Hypercoagulable state in the setting of COVID-19:   Patient is at increased risk for thrombosis in the setting of acute COVID-19 infection, already on DOAC therapy given history of DVT.   - Patient is not on oxygen and should receive standard VTE pharmacologic prophylaxis.  We will continue home Xarelto 20 mg p.o. daily.    Advanced care planning  - Code status: FULL CODE  - Healthcare proxy: Wife       Other Medical Issues:  Cystic fibrosis related insulin-dependent diabetes mellitus on insulin:   Follows with [facility], has CGM device. Glycemic control excellent since admit.  Sugars well controlled.  - Lantus 50 qhs  - Lispro 30 units TID AC  - SSI Standard Scale   ??  H/o DVT:  - Xarelto 20 qd  ??  Depression:   - Fluoxetine, Melatonin, Trazodone  ??  HTN:  - Lisinopril 10 qd  ??  Chronic Low back pain   Continue home chronic opiate therapy.  He has been tapered off intravenous opiates.    - Robaxan prn   - Oxycodone 5-10 milligrams p.o. every 4 hours prn  - Gabapentin 600 TID  -Continue prn toradol for pain.     ??  RLS:   - Pramipexole      DVT prophylaxis. Xarelto      Disposition: Floor.  Could DC home with home health at some point, however he refuses home health.      Please page the [facility]) pager at [phone] with questions.      Pending labs:   Pending Labs     Order Current Status    AFB culture In process          Subjective:   No complaint of fever, chills.  Breathing is at baseline.  Would like to have his port be accessed so he can shower.    Objective:   Physical Exam:    GEN: NAD, supine in bed, watching TV.  Resting comfortably.  HEENT: Mucous membranes moist, EOMI, no scleral icterus.  RESPIRATORY: No increased work of breathing.  Lungs are clear to auscultation.  CARDIOVASCULAR: Regular rate and rhythm without murmur gallop or rub.  ABDOMEN: Soft, nontender, nondistended without palpated  organomegaly  NEUROLOGIC: Alert, oriented, mental status normal to conversation.  Skin: Warm, dry, no acute rashes on visualized area.    Vital signs in last 24 hours:  Temp:  [36.5 ??C (97.7 ??F)-36.9 ??C (98.4 ??F)] 36.9 ??C (98.4 ??F)  Heart Rate:  [70-97] 97  Resp:  [14-18] 16  BP: (120-130)/(65-86) 128/80  MAP (mmHg):  [95-99] 95  SpO2:  [95 %-99 %] 98 %    Intake/Output last 24 hours:    Intake/Output Summary (Last 24 hours) at 03/19/2021 1427  Last data filed at 03/19/2021 0630  Gross per 24 hour   Intake 2965 ml   Output 400 ml   Net 2565 ml       Medications:   Scheduled Meds:  ??? albuterol  2.5 mg Nebulization Q6H While awake (RT)   ??? atorvastatin  20 mg Oral Nightly   ??? cetirizine  10 mg Oral Daily   ??? dornase alfa  2.5 mg Inhalation Daily   ??? elexacaftor-tezacaftor-ivacaft  2 tablet Oral Q AM    And   ??? elexacaftor-tezacaftor-ivacaft  1 tablet Oral Q PM   ??? FLUoxetine  60 mg Oral Daily   ??? fluticasone furoate-vilanteroL  1  puff Inhalation Daily (RT)   ??? fluticasone propionate  1 spray Each Nare Daily   ??? gabapentin  600 mg Oral TID   ??? insulin glargine  50 Units Subcutaneous Nightly   ??? insulin lispro  0-12 Units Subcutaneous ACHS   ??? insulin lispro  30 Units Subcutaneous TID AC   ??? lamoTRIgine  200 mg Oral BID   ??? lisinopriL  10 mg Oral Daily   ??? melatonin  10.5 mg Oral Nightly   ??? montelukast  10 mg Oral Nightly   ??? pancrelipase (Lip-Prot-Amyl)  288,000 units of lipase Oral 3xd Meals   ??? pantoprazole  20 mg Oral Daily   ??? MVW Complete (pediatric multivit 61-D3-vit K)  2 capsule Oral BID   ??? piperacillin-tazobactam (ZOSYN) IV (intermittent)  4.5 g Intravenous Q6H SCH   ??? polyethylene glycol  17 g Oral Daily   ??? pramipexole  0.25 mg Oral Q PM   ??? rivaroxaban  20 mg Oral Daily   ??? senna  2 tablet Oral Nightly   ??? sodium chloride 7%  4 mL Nebulization Q6H While awake (RT)   ??? tobramycin (NEBCIN) IVPB (conventional dosing)  260 mg Intravenous Q12H   ??? traZODone  200 mg Oral Nightly     Continuous Infusions:  ??? sodium chloride 30 mL/hr (03/14/21 0508)       Marando Citron MD  [facility].

## 2021-03-20 LAB — CBC
HEMATOCRIT: 34.7 % — ABNORMAL LOW (ref 39.0–48.0)
HEMOGLOBIN: 11.3 g/dL — ABNORMAL LOW (ref 12.9–16.5)
MEAN CORPUSCULAR HEMOGLOBIN CONC: 32.5 g/dL (ref 32.0–36.0)
MEAN CORPUSCULAR HEMOGLOBIN: 27.1 pg (ref 25.9–32.4)
MEAN CORPUSCULAR VOLUME: 83.4 fL (ref 77.6–95.7)
MEAN PLATELET VOLUME: 8 fL (ref 6.8–10.7)
PLATELET COUNT: 207 10*9/L (ref 150–450)
RED BLOOD CELL COUNT: 4.16 10*12/L — ABNORMAL LOW (ref 4.26–5.60)
RED CELL DISTRIBUTION WIDTH: 15.4 % — ABNORMAL HIGH (ref 12.2–15.2)
WBC ADJUSTED: 5.1 10*9/L (ref 3.6–11.2)

## 2021-03-20 LAB — BASIC METABOLIC PANEL
ANION GAP: 6 mmol/L (ref 5–14)
BLOOD UREA NITROGEN: 15 mg/dL (ref 9–23)
BUN / CREAT RATIO: 16
CALCIUM: 8.5 mg/dL — ABNORMAL LOW (ref 8.7–10.4)
CHLORIDE: 102 mmol/L (ref 98–107)
CO2: 31.7 mmol/L — ABNORMAL HIGH (ref 20.0–31.0)
CREATININE: 0.96 mg/dL
EGFR CKD-EPI (2021) MALE: >90 mL/min/{1.73_m2} (ref >=60)
GLUCOSE RANDOM: 157 mg/dL (ref 70–179)
POTASSIUM: 5.1 mmol/L (ref 3.5–5.1)
SODIUM: 140 mmol/L (ref 135–145)

## 2021-03-20 LAB — TOBRAMYCIN LEVEL, TROUGH: TOBRAMYCIN TROUGH: 0.7 ug/mL (ref 0.0–2.0)

## 2021-03-20 LAB — PROTIME-INR
INR: 1.32
PROTIME: 15.4 s — ABNORMAL HIGH (ref 10.3–13.4)

## 2021-03-20 LAB — TOBRAMYCIN LEVEL, PEAK: TOBRAMYCIN PEAK: 4.3 ug/mL (ref 4.0–10.0)

## 2021-03-20 MED ADMIN — cetirizine (ZyrTEC) tablet 10 mg: 10 mg | ORAL | @ 14:00:00

## 2021-03-20 MED ADMIN — piperacillin-tazobactam (ZOSYN) IVPB (premix) 4.5 g: 4.5 g | INTRAVENOUS | @ 17:00:00 | Stop: 2021-03-28

## 2021-03-20 MED ADMIN — atorvastatin (LIPITOR) tablet 20 mg: 20 mg | ORAL | @ 01:00:00

## 2021-03-20 MED ADMIN — sodium chloride 7% nebulizer solution 4 mL: 4 mL | RESPIRATORY_TRACT | @ 17:00:00

## 2021-03-20 MED ADMIN — traZODone (DESYREL) tablet 200 mg: 200 mg | ORAL | @ 01:00:00

## 2021-03-20 MED ADMIN — pancrelipase (Lip-Prot-Amyl) (CREON) 24,000-76,000 -120,000 unit delayed release capsule 288,000 units of lipase: 288000 [IU] | ORAL | @ 22:00:00

## 2021-03-20 MED ADMIN — senna (SENOKOT) tablet 2 tablet: 2 | ORAL | @ 01:00:00

## 2021-03-20 MED ADMIN — fluticasone propionate (FLONASE) 50 mcg/actuation nasal spray 1 spray: 1 | NASAL | @ 14:00:00

## 2021-03-20 MED ADMIN — tobramycin (NEBCIN) 260 mg in sodium chloride (NS) 0.9 % 100 mL IVPB: 260 mg | INTRAVENOUS | @ 22:00:00 | Stop: 2021-03-28

## 2021-03-20 MED ADMIN — FLUoxetine (PROzac) capsule 60 mg: 60 mg | ORAL | @ 14:00:00

## 2021-03-20 MED ADMIN — oxyCODONE (ROXICODONE) immediate release tablet 10 mg: 10 mg | ORAL | @ 01:00:00 | Stop: 2021-03-21

## 2021-03-20 MED ADMIN — sodium chloride 7% nebulizer solution 4 mL: 4 mL | RESPIRATORY_TRACT | @ 02:00:00

## 2021-03-20 MED ADMIN — pramipexole (MIRAPEX) tablet 0.25 mg: .25 mg | ORAL | @ 22:00:00

## 2021-03-20 MED ADMIN — ketorolac (TORADOL) injection 15 mg: 15 mg | INTRAVENOUS | @ 01:00:00 | Stop: 2021-03-22

## 2021-03-20 MED ADMIN — polyethylene glycol (MIRALAX) packet 17 g: 17 g | ORAL | @ 14:00:00

## 2021-03-20 MED ADMIN — albuterol 2.5 mg /3 mL (0.083 %) nebulizer solution 2.5 mg: 2.5 mg | RESPIRATORY_TRACT | @ 20:00:00

## 2021-03-20 MED ADMIN — rivaroxaban (XARELTO) tablet 20 mg: 20 mg | ORAL | @ 14:00:00

## 2021-03-20 MED ADMIN — methocarbamoL (ROBAXIN) tablet 500 mg: 500 mg | ORAL | @ 19:00:00

## 2021-03-20 MED ADMIN — tobramycin (NEBCIN) 260 mg in sodium chloride (NS) 0.9 % 100 mL IVPB: 260 mg | INTRAVENOUS | @ 10:00:00 | Stop: 2021-03-28

## 2021-03-20 MED ADMIN — lisinopriL (PRINIVIL,ZESTRIL) tablet 10 mg: 10 mg | ORAL | @ 14:00:00

## 2021-03-20 MED ADMIN — insulin lispro (HumaLOG) injection 0-12 Units: 0-12 [IU] | SUBCUTANEOUS | @ 17:00:00

## 2021-03-20 MED ADMIN — gabapentin (NEURONTIN) capsule 600 mg: 600 mg | ORAL | @ 01:00:00

## 2021-03-20 MED ADMIN — insulin lispro (HumaLOG) injection 0-12 Units: 0-12 [IU] | SUBCUTANEOUS | @ 01:00:00

## 2021-03-20 MED ADMIN — insulin lispro (HumaLOG) injection 30 Units: 30 [IU] | SUBCUTANEOUS | @ 17:00:00

## 2021-03-20 MED ADMIN — sodium chloride (NS) 0.9 % infusion: 30 mL/h | INTRAVENOUS | @ 23:00:00

## 2021-03-20 MED ADMIN — piperacillin-tazobactam (ZOSYN) IVPB (premix) 4.5 g: 4.5 g | INTRAVENOUS | @ 23:00:00 | Stop: 2021-03-28

## 2021-03-20 MED ADMIN — montelukast (SINGULAIR) tablet 10 mg: 10 mg | ORAL | @ 01:00:00

## 2021-03-20 MED ADMIN — pantoprazole (PROTONIX) EC tablet 20 mg: 20 mg | ORAL | @ 14:00:00

## 2021-03-20 MED ADMIN — pancrelipase (Lip-Prot-Amyl) (CREON) 24,000-76,000 -120,000 unit delayed release capsule 288,000 units of lipase: 288000 [IU] | ORAL | @ 17:00:00

## 2021-03-20 MED ADMIN — pediatric multivitamin-vit D3 1,500 unit-vit K 800 mcg (MVW COMPLETE FORMULATION) capsule: 2 | ORAL | @ 14:00:00

## 2021-03-20 MED ADMIN — piperacillin-tazobactam (ZOSYN) IVPB (premix) 4.5 g: 4.5 g | INTRAVENOUS | @ 11:00:00 | Stop: 2021-03-28

## 2021-03-20 MED ADMIN — elexacaftor-tezacaftor-ivacaft (TRIKAFTA) one (1) BLUE tablet (PATIENT'S OWN SUPPLY): 1 | ORAL | @ 22:00:00

## 2021-03-20 MED ADMIN — gabapentin (NEURONTIN) capsule 600 mg: 600 mg | ORAL | @ 14:00:00

## 2021-03-20 MED ADMIN — elexacaftor-tezacaftor-ivacaft (TRIKAFTA) 2 ORANGE tablets (PATIENT'S OWN SUPPLY): 2 | ORAL | @ 14:00:00

## 2021-03-20 MED ADMIN — fluticasone furoate-vilanteroL (BREO ELLIPTA) 200-25 mcg/dose inhaler 1 puff: 1 | RESPIRATORY_TRACT | @ 14:00:00

## 2021-03-20 MED ADMIN — sodium chloride 7% nebulizer solution 4 mL: 4 mL | RESPIRATORY_TRACT | @ 14:00:00

## 2021-03-20 MED ADMIN — sodium chloride 7% nebulizer solution 4 mL: 4 mL | RESPIRATORY_TRACT | @ 20:00:00

## 2021-03-20 MED ADMIN — gabapentin (NEURONTIN) capsule 600 mg: 600 mg | ORAL | @ 19:00:00

## 2021-03-20 MED ADMIN — dornase alfa (PULMOZYME) 1 mg/mL solution 2.5 mg: 2.5 mg | RESPIRATORY_TRACT | @ 14:00:00

## 2021-03-20 MED ADMIN — melatonin tablet 10.5 mg: 10 mg | ORAL | @ 01:00:00

## 2021-03-20 MED ADMIN — insulin lispro (HumaLOG) injection 30 Units: 30 [IU] | SUBCUTANEOUS | @ 23:00:00

## 2021-03-20 MED ADMIN — albuterol 2.5 mg /3 mL (0.083 %) nebulizer solution 2.5 mg: 2.5 mg | RESPIRATORY_TRACT | @ 02:00:00

## 2021-03-20 MED ADMIN — piperacillin-tazobactam (ZOSYN) IVPB (premix) 4.5 g: 4.5 g | INTRAVENOUS | @ 04:00:00 | Stop: 2021-03-28

## 2021-03-20 MED ADMIN — insulin lispro (HumaLOG) injection 30 Units: 30 [IU] | SUBCUTANEOUS | @ 13:00:00

## 2021-03-20 MED ADMIN — pediatric multivitamin-vit D3 1,500 unit-vit K 800 mcg (MVW COMPLETE FORMULATION) capsule: 2 | ORAL | @ 01:00:00

## 2021-03-20 MED ADMIN — albuterol 2.5 mg /3 mL (0.083 %) nebulizer solution 2.5 mg: 2.5 mg | RESPIRATORY_TRACT | @ 14:00:00 | Stop: 2021-03-20

## 2021-03-20 MED ADMIN — lamoTRIgine (LaMICtal) tablet 200 mg: 200 mg | ORAL | @ 14:00:00

## 2021-03-20 MED ADMIN — lamoTRIgine (LaMICtal) tablet 200 mg: 200 mg | ORAL | @ 01:00:00

## 2021-03-20 NOTE — Unmapped (Signed)
Patient did well throughout shift. Treatments and airway clearance given per order. No acute events of note.

## 2021-03-20 NOTE — Unmapped (Signed)
Aminoglycoside Therapeutic Monitoring Pharmacy Note    Christian Young is a 35 y.o. male continuing tobramycin. Date of therapy initiation: 03/14/21    Indication: CF exacerbation    Prior Dosing Information: Previous regimen 260 mg IV q 12 hrs (April 2022)     Goals:  Therapeutic Drug Levels  ?? Trough level: tobramycin <1 mg/L  ?? Peak level: tobramycin 10-14 mg/L    Additional Clinical Monitoring/Outcomes  Renal function, volume status (intake and output)    Results:   ?? Trough level: 0.7   ?? peak level 4.6 extrapolated to 6.4    Wt Readings from Last 1 Encounters:   03/14/21 (!) 111.2 kg (245 lb 1.6 oz)     Lab Results   Component Value Date    CREATININE 0.96 03/20/2021       Pharmacokinetic Considerations and Significant Drug Interactions:  ??? Adult (estimated initial): Vd = 29.8 L, ke = 0.33 hr-1  ??? Concurrent nephrotoxic meds: Zosyn    Assessment/Plan:  Recommendation(s)  ??? Continue current regimen of 260mg  IV Q12 despite level being a bit low, will re-check in a couple of days and adjust if appropriate at that time.   ??? Estimated peak and trough on recommended regimen: peak = 10.5 mg/L, trough = 0.1 mg/L    Follow-up  ??? Level due: in 3-5 days  ??? A pharmacist will continue to monitor and order levels as appropriate    Please page service pharmacist with questions/clarifications.    Cherlynn Polo, PharmD, BCPS

## 2021-03-20 NOTE — Unmapped (Signed)
Plan of the day discussed with pt.  AC/HS BS checks with sliding scale and meal coverage.  PT on IV ABX.  Up ad lib in room.  Pain medication given PRN.        Problem: Adult Inpatient Plan of Care  Goal: Plan of Care Review  Outcome: Ongoing - Unchanged  Goal: Patient-Specific Goal (Individualized)  Outcome: Ongoing - Unchanged  Goal: Absence of Hospital-Acquired Illness or Injury  Outcome: Ongoing - Unchanged  Intervention: Identify and Manage Fall Risk  Recent Flowsheet Documentation  Taken 03/20/2021 1400 by Danae Chen, RN  Safety Interventions:  ??? nonskid shoes/slippers when out of bed  ??? isolation precautions  Taken 03/20/2021 1200 by Danae Chen, RN  Safety Interventions:  ??? isolation precautions  ??? nonskid shoes/slippers when out of bed  Taken 03/20/2021 1000 by Danae Chen, RN  Safety Interventions:  ??? isolation precautions  ??? nonskid shoes/slippers when out of bed  Taken 03/20/2021 0800 by Danae Chen, RN  Safety Interventions:  ??? nonskid shoes/slippers when out of bed  ??? isolation precautions  Intervention: Prevent and Manage VTE (Venous Thromboembolism) Risk  Recent Flowsheet Documentation  Taken 03/20/2021 0800 by Danae Chen, RN  Activity Management:  ??? activity adjusted per tolerance  ??? ambulated to bathroom  Goal: Optimal Comfort and Wellbeing  Outcome: Ongoing - Unchanged  Note: Able to make his needs known, up ad lib   Goal: Readiness for Transition of Care  Outcome: Ongoing - Unchanged  Goal: Rounds/Family Conference  Outcome: Ongoing - Unchanged     Problem: Infection  Goal: Absence of Infection Signs and Symptoms  Outcome: Ongoing - Unchanged  Intervention: Prevent or Manage Infection  Recent Flowsheet Documentation  Taken 03/20/2021 1400 by Danae Chen, RN  Isolation Precautions: spec airborne/contact precautions maintained  Taken 03/20/2021 1200 by Danae Chen, RN  Isolation Precautions: spec airborne/contact precautions maintained  Taken 03/20/2021 1000 by Danae Chen, RN  Isolation Precautions: spec airborne/contact precautions maintained  Taken 03/20/2021 0800 by Danae Chen, RN  Isolation Precautions: spec airborne/contact precautions maintained     Problem: Infection (Cystic Fibrosis)  Goal: Absence of Infection Signs and Symptoms  Outcome: Ongoing - Unchanged  Intervention: Manage Infection and Prevent Transmission  Recent Flowsheet Documentation  Taken 03/20/2021 1400 by Danae Chen, RN  Isolation Precautions: spec airborne/contact precautions maintained  Taken 03/20/2021 1200 by Danae Chen, RN  Isolation Precautions: spec airborne/contact precautions maintained  Taken 03/20/2021 1000 by Danae Chen, RN  Isolation Precautions: spec airborne/contact precautions maintained  Taken 03/20/2021 0800 by Danae Chen, RN  Isolation Precautions: spec airborne/contact precautions maintained     Problem: Oral Intake Inadequate (Cystic Fibrosis)  Goal: Optimal Nutrition Intake  Outcome: Ongoing - Unchanged  Note: Independent with drinking and eating.     Problem: Malabsorption (Cystic Fibrosis)  Goal: Optimal Bowel Elimination  Outcome: Ongoing - Unchanged     Problem: Respiratory Compromise (Cystic Fibrosis)  Goal: Effective Oxygenation and Ventilation  Outcome: Ongoing - Unchanged  Intervention: Optimize Oxygenation and Ventilation  Recent Flowsheet Documentation  Taken 03/20/2021 0800 by Danae Chen, RN  Head of Bed [facility]) Positioning: HOB at 15 degrees     Problem: Airway Clearance Ineffective  Goal: Effective Airway Clearance  Outcome: Ongoing - Unchanged  Intervention: Promote Airway Secretion Clearance  Recent Flowsheet Documentation  Taken 03/20/2021 0800 by Danae Chen, RN  Activity Management:  ??? activity adjusted per tolerance  ??? ambulated to bathroom     Problem: Breathing Pattern Ineffective  Goal: Effective Breathing Pattern  Outcome: Ongoing - Unchanged  Intervention: Promote Improved Breathing Pattern  Recent Flowsheet Documentation  Taken 03/20/2021 0800 by Danae Chen, RN  Head of Bed [facility]) Positioning: HOB at 15 degrees     Problem: Gas Exchange Impaired  Goal: Optimal Gas Exchange  Outcome: Ongoing - Unchanged  Intervention: Optimize Oxygenation and Ventilation  Recent Flowsheet Documentation  Taken 03/20/2021 0800 by Danae Chen, RN  Head of Bed [facility]) Positioning: HOB at 15 degrees

## 2021-03-20 NOTE — Unmapped (Signed)
Medications given per MAR. Pending labs. Will continue to monitor.   Problem: Adult Inpatient Plan of Care  Goal: Plan of Care Review  Outcome: Ongoing - Unchanged  Goal: Patient-Specific Goal (Individualized)  Outcome: Ongoing - Unchanged  Goal: Absence of Hospital-Acquired Illness or Injury  Outcome: Ongoing - Unchanged     Problem: Infection  Goal: Absence of Infection Signs and Symptoms  Outcome: Ongoing - Unchanged     Problem: Oral Intake Inadequate (Cystic Fibrosis)  Goal: Optimal Nutrition Intake  Outcome: Ongoing - Unchanged

## 2021-03-21 MED ADMIN — pantoprazole (PROTONIX) EC tablet 20 mg: 20 mg | ORAL | @ 12:00:00

## 2021-03-21 MED ADMIN — lamoTRIgine (LaMICtal) tablet 200 mg: 200 mg | ORAL | @ 12:00:00

## 2021-03-21 MED ADMIN — pediatric multivitamin-vit D3 1,500 unit-vit K 800 mcg (MVW COMPLETE FORMULATION) capsule: 2 | ORAL | @ 12:00:00

## 2021-03-21 MED ADMIN — albuterol 2.5 mg /3 mL (0.083 %) nebulizer solution 2.5 mg: 2.5 mg | RESPIRATORY_TRACT

## 2021-03-21 MED ADMIN — gabapentin (NEURONTIN) capsule 600 mg: 600 mg | ORAL | @ 12:00:00

## 2021-03-21 MED ADMIN — sodium chloride 7% nebulizer solution 4 mL: 4 mL | RESPIRATORY_TRACT | @ 13:00:00

## 2021-03-21 MED ADMIN — tobramycin (NEBCIN) 260 mg in sodium chloride (NS) 0.9 % 100 mL IVPB: 260 mg | INTRAVENOUS | @ 11:00:00 | Stop: 2021-03-21

## 2021-03-21 MED ADMIN — lamoTRIgine (LaMICtal) tablet 200 mg: 200 mg | ORAL | @ 01:00:00

## 2021-03-21 MED ADMIN — albuterol 2.5 mg /3 mL (0.083 %) nebulizer solution 2.5 mg: 2.5 mg | RESPIRATORY_TRACT | @ 17:00:00

## 2021-03-21 MED ADMIN — montelukast (SINGULAIR) tablet 10 mg: 10 mg | ORAL | @ 01:00:00

## 2021-03-21 MED ADMIN — piperacillin-tazobactam (ZOSYN) IVPB (premix) 4.5 g: 4.5 g | INTRAVENOUS | @ 03:00:00 | Stop: 2021-03-28

## 2021-03-21 MED ADMIN — pancrelipase (Lip-Prot-Amyl) (CREON) 24,000-76,000 -120,000 unit delayed release capsule 288,000 units of lipase: 288000 [IU] | ORAL | @ 21:00:00

## 2021-03-21 MED ADMIN — insulin glargine (LANTUS) injection 50 Units: 50 [IU] | SUBCUTANEOUS | @ 01:00:00

## 2021-03-21 MED ADMIN — insulin lispro (HumaLOG) injection 30 Units: 30 [IU] | SUBCUTANEOUS | @ 21:00:00

## 2021-03-21 MED ADMIN — sodium chloride 7% nebulizer solution 4 mL: 4 mL | RESPIRATORY_TRACT

## 2021-03-21 MED ADMIN — pramipexole (MIRAPEX) tablet 0.25 mg: .25 mg | ORAL | @ 22:00:00

## 2021-03-21 MED ADMIN — sodium chloride 7% nebulizer solution 4 mL: 4 mL | RESPIRATORY_TRACT | @ 17:00:00

## 2021-03-21 MED ADMIN — dornase alfa (PULMOZYME) 1 mg/mL solution 2.5 mg: 2.5 mg | RESPIRATORY_TRACT | @ 13:00:00

## 2021-03-21 MED ADMIN — fluticasone propionate (FLONASE) 50 mcg/actuation nasal spray 1 spray: 1 | NASAL | @ 12:00:00

## 2021-03-21 MED ADMIN — polyethylene glycol (MIRALAX) packet 17 g: 17 g | ORAL | @ 12:00:00

## 2021-03-21 MED ADMIN — sodium chloride 7% nebulizer solution 4 mL: 4 mL | RESPIRATORY_TRACT | @ 21:00:00

## 2021-03-21 MED ADMIN — insulin lispro (HumaLOG) injection 0-12 Units: 0-12 [IU] | SUBCUTANEOUS | @ 21:00:00

## 2021-03-21 MED ADMIN — piperacillin-tazobactam (ZOSYN) IVPB (premix) 4.5 g: 4.5 g | INTRAVENOUS | @ 16:00:00 | Stop: 2021-03-21

## 2021-03-21 MED ADMIN — gabapentin (NEURONTIN) capsule 600 mg: 600 mg | ORAL | @ 01:00:00

## 2021-03-21 MED ADMIN — melatonin tablet 10.5 mg: 10 mg | ORAL | @ 01:00:00

## 2021-03-21 MED ADMIN — traZODone (DESYREL) tablet 200 mg: 200 mg | ORAL | @ 01:00:00

## 2021-03-21 MED ADMIN — albuterol 2.5 mg /3 mL (0.083 %) nebulizer solution 2.5 mg: 2.5 mg | RESPIRATORY_TRACT | @ 13:00:00

## 2021-03-21 MED ADMIN — heparin, porcine (PF) 100 unit/mL injection 500 Units: 500 [IU] | INTRAVENOUS | @ 20:00:00 | Stop: 2021-03-21

## 2021-03-21 MED ADMIN — fluticasone furoate-vilanteroL (BREO ELLIPTA) 200-25 mcg/dose inhaler 1 puff: 1 | RESPIRATORY_TRACT | @ 13:00:00

## 2021-03-21 MED ADMIN — insulin lispro (HumaLOG) injection 30 Units: 30 [IU] | SUBCUTANEOUS | @ 12:00:00

## 2021-03-21 MED ADMIN — pancrelipase (Lip-Prot-Amyl) (CREON) 24,000-76,000 -120,000 unit delayed release capsule 288,000 units of lipase: 288000 [IU] | ORAL | @ 12:00:00

## 2021-03-21 MED ADMIN — piperacillin-tazobactam (ZOSYN) IVPB (premix) 4.5 g: 4.5 g | INTRAVENOUS | @ 10:00:00 | Stop: 2021-03-21

## 2021-03-21 MED ADMIN — pancrelipase (Lip-Prot-Amyl) (CREON) 24,000-76,000 -120,000 unit delayed release capsule 288,000 units of lipase: 288000 [IU] | ORAL | @ 16:00:00

## 2021-03-21 MED ADMIN — pediatric multivitamin-vit D3 1,500 unit-vit K 800 mcg (MVW COMPLETE FORMULATION) capsule: 2 | ORAL | @ 01:00:00

## 2021-03-21 MED ADMIN — lisinopriL (PRINIVIL,ZESTRIL) tablet 10 mg: 10 mg | ORAL | @ 12:00:00

## 2021-03-21 MED ADMIN — atorvastatin (LIPITOR) tablet 20 mg: 20 mg | ORAL | @ 01:00:00

## 2021-03-21 MED ADMIN — insulin lispro (HumaLOG) injection 30 Units: 30 [IU] | SUBCUTANEOUS | @ 16:00:00

## 2021-03-21 MED ADMIN — gabapentin (NEURONTIN) capsule 600 mg: 600 mg | ORAL | @ 18:00:00

## 2021-03-21 MED ADMIN — oxyCODONE (ROXICODONE) immediate release tablet 5 mg: 5 mg | ORAL | @ 20:00:00 | Stop: 2021-03-22

## 2021-03-21 MED ADMIN — oxyCODONE (ROXICODONE) immediate release tablet 5 mg: 5 mg | ORAL | @ 10:00:00 | Stop: 2021-03-21

## 2021-03-21 MED ADMIN — rivaroxaban (XARELTO) tablet 20 mg: 20 mg | ORAL | @ 12:00:00

## 2021-03-21 MED ADMIN — cetirizine (ZyrTEC) tablet 10 mg: 10 mg | ORAL | @ 12:00:00

## 2021-03-21 MED ADMIN — senna (SENOKOT) tablet 2 tablet: 2 | ORAL | @ 01:00:00

## 2021-03-21 MED ADMIN — elexacaftor-tezacaftor-ivacaft (TRIKAFTA) one (1) BLUE tablet (PATIENT'S OWN SUPPLY): 1 | ORAL | @ 21:00:00

## 2021-03-21 MED ADMIN — FLUoxetine (PROzac) capsule 60 mg: 60 mg | ORAL | @ 12:00:00

## 2021-03-21 MED ADMIN — elexacaftor-tezacaftor-ivacaft (TRIKAFTA) 2 ORANGE tablets (PATIENT'S OWN SUPPLY): 2 | ORAL | @ 12:00:00

## 2021-03-21 MED ADMIN — albuterol 2.5 mg /3 mL (0.083 %) nebulizer solution 2.5 mg: 2.5 mg | RESPIRATORY_TRACT | @ 21:00:00

## 2021-03-21 NOTE — Unmapped (Signed)
Plan of the day discussed with pt this am.  IV ABX, port a cath in placed.  AC/HS BS checks.      Pt got a raise bubble rash around the dressing edge and redness around the dressing site to the middle of chest.  [facility] discontinue.  Tried PIC twice.      Will cont to monitor.      Problem: Adult Inpatient Plan of Care  Goal: Plan of Care Review  Outcome: Ongoing - Unchanged  Goal: Patient-Specific Goal (Individualized)  Outcome: Ongoing - Unchanged  Goal: Absence of Hospital-Acquired Illness or Injury  Outcome: Ongoing - Unchanged  Intervention: Identify and Manage Fall Risk  Recent Flowsheet Documentation  Taken 03/21/2021 1200 by Danae Chen, RN  Safety Interventions:  ??? isolation precautions  ??? nonskid shoes/slippers when out of bed  Taken 03/21/2021 1000 by Danae Chen, RN  Safety Interventions:  ??? isolation precautions  ??? nonskid shoes/slippers when out of bed  Taken 03/21/2021 0800 by Danae Chen, RN  Safety Interventions:  ??? nonskid shoes/slippers when out of bed  ??? isolation precautions  Intervention: Prevent and Manage VTE (Venous Thromboembolism) Risk  Recent Flowsheet Documentation  Taken 03/21/2021 0800 by Danae Chen, RN  Activity Management:  ??? activity adjusted per tolerance  ??? activity encouraged  ??? ambulated to bathroom  Goal: Optimal Comfort and Wellbeing  Outcome: Ongoing - Unchanged  Note: Able to make his needs known, up ad lib  Goal: Readiness for Transition of Care  Outcome: Ongoing - Unchanged  Goal: Rounds/Family Conference  Outcome: Ongoing - Unchanged     Problem: Infection  Goal: Absence of Infection Signs and Symptoms  Outcome: Ongoing - Unchanged  Intervention: Prevent or Manage Infection  Recent Flowsheet Documentation  Taken 03/21/2021 1200 by Danae Chen, RN  Isolation Precautions: spec airborne/contact precautions maintained  Taken 03/21/2021 1000 by Danae Chen, RN  Isolation Precautions: spec airborne/contact precautions maintained  Taken 03/21/2021 0800 by Danae Chen, RN  Isolation Precautions: spec airborne/contact precautions maintained     Problem: Infection (Cystic Fibrosis)  Goal: Absence of Infection Signs and Symptoms  Outcome: Ongoing - Unchanged  Intervention: Manage Infection and Prevent Transmission  Recent Flowsheet Documentation  Taken 03/21/2021 1200 by Danae Chen, RN  Isolation Precautions: spec airborne/contact precautions maintained  Taken 03/21/2021 1000 by Danae Chen, RN  Isolation Precautions: spec airborne/contact precautions maintained  Taken 03/21/2021 0800 by Danae Chen, RN  Isolation Precautions: spec airborne/contact precautions maintained     Problem: Oral Intake Inadequate (Cystic Fibrosis)  Goal: Optimal Nutrition Intake  Outcome: Ongoing - Unchanged  Note: Ad lib on drinking and eating     Problem: Malabsorption (Cystic Fibrosis)  Goal: Optimal Bowel Elimination  Outcome: Ongoing - Unchanged     Problem: Respiratory Compromise (Cystic Fibrosis)  Goal: Effective Oxygenation and Ventilation  Outcome: Ongoing - Unchanged     Problem: Airway Clearance Ineffective  Goal: Effective Airway Clearance  Outcome: Ongoing - Unchanged  Intervention: Promote Airway Secretion Clearance  Recent Flowsheet Documentation  Taken 03/21/2021 0800 by Danae Chen, RN  Activity Management:  ??? activity adjusted per tolerance  ??? activity encouraged  ??? ambulated to bathroom     Problem: Breathing Pattern Ineffective  Goal: Effective Breathing Pattern  Outcome: Ongoing - Unchanged     Problem: Gas Exchange Impaired  Goal: Optimal Gas Exchange  Outcome: Ongoing - Unchanged

## 2021-03-21 NOTE — Unmapped (Signed)
PULMONARY CONSULT  NOTE      Patient: Christian Young([birth year])  Reason for consultation: [name] is a 35 y.o. male who is seen in consultation at the request of Rica Koyanagi, MD for comprehensive evaluation of COVID in CF exacerbation.    Assessment and Recommendations:      Principal Problem:    Cystic fibrosis with pulmonary exacerbation (CMS-HCC)  Active Problems:    Essential hypertension    Depressive disorder    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)    Pancreatic insufficiency due to cystic fibrosis (CMS-HCC)    History of Mycobacterium abscessus infection    Chronic deep vein thrombosis (DVT) of lower extremity (CMS-HCC)    Restless leg syndrome    Degeneration of lumbar intervertebral disc  Resolved Problems:    * No resolved hospital problems. *    Christian Young??is a 35 y.o.??male??with PMHx of CF who??presented to [facility] with??Cystic fibrosis with pulmonary exacerbation (CMS-HCC) secondary to COVID.  ??  1. Acute exacerbation of CF bronchiectasis with COVID:   - S/p remdesivir x 3 days (completed 8/6)   - Pt typically grows PsA and MSSA though cx from 8/2 has now resulted with 1+ stenotrophomonas which would not be adequately covered by his current regimen; however, he has had substantial improvement, so would not initiate treatment at this point though if he develops worsening respiratory symptoms, low threshold to initiate Bactrim or Levaquin  - Continue Zosyn and Tobramycin IV - course to complete tomorrow  - Continue home Trikafta  - Continue ICS/LABA and Pulmozyme  - Continue airway clearance TID: albuterol, 7% HTS, chest vest/aerobika  ??  2. CFRD:   - Continue current insulin regimen. Glucoses doing well.  ??    We appreciate the opportunity to assist in the care of this patient.  Please page [phone] with any questions.    Verne Grain, MD    Subjective:      History of Present Illness:  [name] is a 35 y.o. male admitted for cystic fibrosis exacerbation. He has a history of CF (Y865H and [id number] insertion) on trikafta, CFRD on insulin, history of DVT (on xarelto currently), with recent baseline lung function of FEV1 ~72% .  ??  Last week started feeling fatigued with sinus symptoms which is typical of his exacerbations but then started having fevers which is unusual. He home COVID tested several days ago and was negative. He then presented to the ED last night, was COVID positive on the RPR (rapid was negative first).     He has been having chest pain - typical during exacerbations but also whole body aches and pains which is atypical for him.     INTERVAL HISTORY (8/9): Pt reports substantial improvement, feeling at his baseline. Minimal cough and sputum production. Tolerating airway clearance and antibiotics without any difficulty.     Review of Systems: A comprehensive review of systems was performed and was negative except as above in HPI  Past Medical History:   Diagnosis Date   ??? Anxiety    ??? Chronic pain disorder    ??? Cystic fibrosis (CMS-HCC)    ??? Depression    ??? Hypertension    ??? Lumbar radiculopathy 10/26/2020   ??? Nonproductive cough 04/05/2018     Past Surgical History:   Procedure Laterality Date   ??? IR INSERT PORT AGE GREATER THAN 5 YRS  03/27/2019    IR INSERT PORT AGE GREATER THAN 5 YRS 03/27/2019 Christian Young  [name], MD IMG VIR [facility]   ??? PR REMOVAL OF LUNG,LOBECTOMY Right 03/29/2017    Procedure: REMOVAL OF LUNG, OTHER THAN PNEUMONECTOMY; SINGLE LOBE (LOBECTOMY);  Surgeon: Cherie Dark, MD;  Location: MA[facility];  Service: Thoracic     Medications reviewed in Epic  Allergies as of 03/13/2021 - Reviewed 03/13/2021   Allergen Reaction Noted   ??? Aztreonam Anaphylaxis, Hives, Nausea And Vomiting, and Rash 09/06/2015   ??? [place] [aztreonam lysine] Anaphylaxis 12/27/2016   ??? Cefepime Itching, Nausea Only, and Other (See Comments) 09/06/2015   ??? Other Anaphylaxis and Other (See Comments) 09/06/2015   ??? Slo-bid 100 Anaphylaxis 06/20/2017 ??? Tobramycin Tinnitus 09/06/2015   ??? Banana Itching and Nausea And Vomiting 07/29/2016     Family History   Problem Relation Age of Onset   ??? Bipolar disorder Mother    ??? Depression Mother      Social History     Tobacco Use   ??? Smoking status: Former Smoker     Types: e-Cigarettes   ??? Smokeless tobacco: Never Used   Substance Use Topics   ??? Alcohol use: Not Currently     Alcohol/week: 3.0 standard drinks     Types: 3 Glasses of wine per week        Objective:      Physical Exam:  Vitals:    03/20/21 1146 03/20/21 2059 03/21/21 0423 03/21/21 0825   BP: 142/89 130/86 126/76 129/85   Pulse: 92 95 91    Resp: 18 16 14     Temp: 36.8 ??C (98.2 ??F) 36.7 ??C (98.1 ??F) 36 ??C (96.8 ??F)    TempSrc: Oral Oral Oral    SpO2: 96% 95% 97%    Weight:       Height:         Gen: Patient awake, alert, oriented to time place and person, no pallor, icterus,  cyanosis or clubbing.  Eyes: Pupils equal round and reacting light bilaterally  CVS: S1-S2 heard normally, no murmurs appreciated, no rubs or gallops  Respiratory: Air entry equal bilaterally without any crackles or wheeze  Abdomen: Soft, nontender, nondistended, no organomegaly, bowel sounds present  Neuro: Nonfocal neurological exam, no sensory deficits, cranial nerves grossly intact  Skin extremities: Scaling of skin noted on bilateral shoulders, no itching, no rash, no pedal edema       Diagnostic Review:   All labs and images were personally reviewed.

## 2021-03-21 NOTE — Unmapped (Signed)
Cystic Fibrosis Nutrition Assessment    Inpatient, Telehealth via phone: MD Consult this admission and related follow up  Primary Pulmonary Provider: Dr [name]  ===================================================================  Christian Young is a 35 y.o. male w/ PMH significant for CF, CFRD, pancreatic insufficiency who presented on 8/1 with CF exacerbation.     8/9: Christian Young remains admitted for IV abx; course scheduled to finish on 8/11. On Regular Diet, consuming 100% of provided meals. Last documented BM 8/8 x 1. Requests diet change to [facility]. No other concerns or requests today.   ===================================================================  INTERVENTION:    1. Modify diet to High Calorie High Protein    2. Recommend CF vitamin regimen:  MVW Complete Formulation gel cap 2 daily as ordered    3. Defer to team re: Vit K 2x weekly while on IV antibiotics given anticoagulation with Xarelto    4. Continue snacks ordered in computrition per patient request  - Lays, peanut butter and jelly, fruits     5. Adjust enzyme regimen based on patient's home dose  - Creon 24,000 x 11-12 caps/meal, 5-6 caps/snack  - discharge with home enzyme formulation - Creon 36,000s    6. Continue remainder of nutrition regimen:  - acid reducer; protonix  - bowel regimen; miralax daily, senna nightly  - anti-nausea; zofran PRN  - insulin regimen per endo    Inpatient:   Will follow up with patient per protocol: 1-2 times per week (and more frequent as indicated)  Monitor for potential discharge needs with multi-disciplinary team.   ===================================================================  ASSESSMENT:  Nutrition Category = Adult Class 1 Obesity: BMI 30 to < 35 kg/m2    Estimated daily needs: 3717 kcal/day, 126-169 g PRO/day, 3216 mL fluid/day  Calories estimated using Cystic Fibrosis Conference Formula, protein per DRI x 1.5-2, fluid per [facility]    Current diet is appropriate for CF. Current PO intake is adequate to meet estimated CF needs. Patient continues to work towards goals for weight management.   Enzyme dose is within established guidelines. Patient would benefit from adjustment in enzyme regimen. Vitamin prescription is appropriate to reach/maintain optimal fat soluble vitamin levels. Patient may benefit from vitamin K supplementation while on IV antibiotics. Sodium needs for CF met with PO and/or supplement. Bowel regimen is appropriate. Acid reducer appropriate for GERD and enzyme activation. Patient on CFTR modulator is consuming adequate amounts of fat-containing foods with prescribed medication to optimize absorption.    ASPEN/AND Malnutrition Screening:  Patient does not meet ASPEN/AND criteria for malnutrition at this time.    Nutrition Focused Physical Exam:   Assessment not indicated d/t lack of malnutrition risk factors.    Goals:  1. Ongoing:  Meet estimated daily needs  2. Ongoing:  Reach/maintain established anthropometric goals for Adult CF: BMI < 30 kg/m2   3. Ongoing:  Normal fat-soluble vitamin levels: Vitamin A, Vitamin E and PT per lab range; Vitamin D 25OH total >30   4. Ongoing:  Maintain glucose control. Carbohydrate content of diet should comprise 40-50% of total calorie needs, but carbohydrates are not restricted in this population.    5. Ongoing:  Meet sodium needs for CF     Nutrition goals reviewed, and relevant barriers identified and addressed: none evident.   Patient is evaluated to have good  willingness and ability to achieve nutrition goals.   ===================================================================  INPATIENT:  Christian Young is admitted with Cystic fibrosis exacerbation (CMS-HCC) [E84.9].    Current Nutrition Orders (inpatient):  Oral intake  Nutrition Orders   (From admission, onward)             Start     Ordered    03/14/21 0333  Nutrition Therapy Regular/House  Effective now        Question:  Nutrition Therapy:  Answer:  Regular/House 03/14/21 0332              CF Nutrition related medications (inpatient): Nutritionally relevant medications reviewed.  lipitor  trikafta  Insulin regimen per endo  Creon 24,000 x 12 caps/meal, 4 caps/snack  protonix  MVW complete formulation gel cap BID  miralax once daily  Senna once daily  IV antibiotics    CF Nutrition related labs (inpatient): reviewed; PT now elevated  ==================================================================  CLINICAL DATA:  Past Medical History:   Diagnosis Date   ??? Anxiety    ??? Chronic pain disorder    ??? Cystic fibrosis (CMS-HCC)    ??? Depression    ??? Hypertension    ??? Lumbar radiculopathy 10/26/2020   ??? Nonproductive cough 04/05/2018     Anthroprometric Evaluation:  Weight changes: weight uptrending since June 2022. Intentional weight loss over the past ~1 year.  CFTR modulator and weight change: On Trikafta  BMI Readings from Last 3 Encounters:   03/14/21 33.20 kg/m??   03/09/21 32.53 kg/m??   02/06/21 30.88 kg/m??     Wt Readings from Last 3 Encounters:   03/14/21 (!) 111.2 kg (245 lb 1.6 oz)   03/09/21 (!) 109 kg (240 lb 3.2 oz)   02/06/21 (!) 103.4 kg (228 lb)     Ht Readings from Last 3 Encounters:   03/14/21 183 cm (6' 0.05)   03/09/21 183 cm (6' 0.05)   02/06/21 183 cm (6' 0.05)     ==================================================================  Energy Intake (outpatient):  Diet: Christian Young endorses stable appetite and PO intake prior to admission. No reported nutrition concerns or questions.    Allergies, Intolerances, Sensitivities, and/or Cultural/Religious Dietary Restrictions:  See below  Allergies   Allergen Reactions   ??? Aztreonam Anaphylaxis, Hives, Nausea And Vomiting and Rash     fevers  Patient stated that he only vomited x1 with [place] in the past.????    fevers  Patient stated that he only vomited x1 with [place] in the past.????   ??? [place] [Aztreonam Lysine] Anaphylaxis   ??? Cefepime Itching, Nausea Only and Other (See Comments)     Headaches also   ??? Other Anaphylaxis and Other (See Comments)     Other reaction(s): Other (See Comments)  Bananas: itchy throat  Slo Bid record from [employer] states anaphylaxis.????Pt states this was from childhood and does not know reaction.  Bananas, causes itchy throat   ??? Slo-Bid 100 Anaphylaxis   ??? Tobramycin Tinnitus     From OSH record-documented as tinnitus but has received IV tobra with close monitoring.  Pt has received Tobramycin at [facility] since this allergy documented    From OSH record-documented as tinnitus but has received IV tobra with close monitoring.   ??? Banana Itching and Nausea And Vomiting   Sodium in diet: Adequate from diet  Calcium in diet:  Adequate from diet  Food Insecurity:    -   Food Insecurity: No Food Insecurity   ??? Worried About Programme researcher, broadcasting/film/video in the Last Year: Never true   ??? Ran Out of Food in the Last Year: Never true   CFTR modulator and Diet: Prescribed Trikafta (elexacaftor/tezacaftor/ivacaftor).  PO Supplements: none  Patient resources  for DME/formula: n/a  Appetite Stimulant: none  Enteral feeding tube: none  Physical activity: limited recently 2/2 back pain; not discussed today    GI/Malabsorption (outpatient):  Enzyme brand, (meals/snacks):  Creon 36,000 @ 8/meal and 4/snack  Enzyme administration details: correct pre-meal administration., good compliance at all meals and snacks  Enzyme dose per MEAL (units lipase/kg/meal) 2589  Enzyme dose per DAY (units lipase/kg/day) 03474  GI meds:  Nutritionally relevant medications reviewed. Miralax, probiotic  Stools (steatorrhea): 1-2 daily, not greasy  Stools (constipation): no s/s of constipation  GI symptoms:  none  Fecal Fat Studies: pancreatic insufficient    No results found for: [id number]  Lab Results   Component Value Date    ELAST <15 (L) 03/19/2017     No results found for: PELAI    Vitamins/Minerals (outpatient):  CF-specific MVI, dose, compliance: MVW Complete Formulation Softgel regular 2 daily, good compliance  Other vitamins/minerals/herbals: none  Patient Resources for vitamins: -none  Calcium supplement: none  Fat-soluble vitamin levels: within normal limits in June  Lab Results   Component Value Date    VITAMINA 58.7 02/06/2021    VITAMINA 42.4 01/18/2020    VITAMINA 44.4 11/19/2016     Lab Results   Component Value Date    CRP <4.0 03/14/2021    CRP <4.0 11/24/2020    CRP <4.0 06/28/2020     Lab Results   Component Value Date    VITDTOTAL 38.2 02/06/2021    VITDTOTAL 27.9 03/19/2017     Lab Results   Component Value Date    VITAME 16.0 02/06/2021     Lab Results   Component Value Date    PT 15.4 (H) 03/20/2021    PT 14.0 (H) 03/17/2021    PT 14.8 (H) 03/16/2021     Lab Results   Component Value Date    DESGCARBPT 0.2 03/20/2017     No results found for: PIVKAII    Bone Health: Normal vitamin D level. Adequate calcium intake from diet. Due for DEXA next year. Normal bone density July 2018.     CF Related Diabetes: yes, on insulin    Lab Results   Component Value Date    GLUF 178 (H) 01/18/2020     No results found for: GLUCOSE2HR  Lab Results   Component Value Date    A1C 10.0 (H) 03/09/2021    A1C 10.7 (H) 11/29/2020    A1C 9.4 (H) 01/18/2020     Jackqulyn Livings MPH, RD, LDN  Pager: [phone]

## 2021-03-21 NOTE — Unmapped (Signed)
[place] Hospitalist Daily Progress Note     LOS: 7 days     Assessment/Plan:  Principal Problem:    Cystic fibrosis with pulmonary exacerbation (CMS-HCC)  Active Problems:    Essential hypertension    Depressive disorder    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)    Pancreatic insufficiency due to cystic fibrosis (CMS-HCC)    History of Mycobacterium abscessus infection    Chronic deep vein thrombosis (DVT) of lower extremity (CMS-HCC)    Restless leg syndrome    Degeneration of lumbar intervertebral disc  Resolved Problems:    * No resolved hospital problems. *         Christian Young is a 35 y.o. male who presented to [facility] with Cystic fibrosis with pulmonary exacerbation (CMS-HCC).    CF with exacerbation:   He has a bacterial cystic fibrosis exacerbation with coincident COVID infection.  - CF sputum culture resulted 8/7 shows 1+ mucoid Pseudomonas aeruginosa and 1+ Stenotrophomonas maltophilia., AFB culture (has previously grown nontuberculous Mycobacterium) is negative to date.  - Audiology consult follow-up as outpatient. Has a history of tinnitus with tobramycin.  - Zosyn (4.5 mg q6 hours) and IV tobramycin pharmacy to dose, last doses tomorrow morning.  - PFT - repeat testing per Pulmonary Medicine, study ordered for tomorrow.  - Airway clearance with vest and aerobika TID, albuterol  TID, HTS TID, pulmozyme, symbicort formulary sub  -INR 8/8  1.3.    -He is currently on a pediatric multivitamin with 800 mcg of vitamin K 2 times a day.  We will continue this and follow INR as outpatient.  - cont home enzymes and vitamins  - cont home trikafta (pt brought home supply with him)      Acute hypoxemic respiratory failure due to COVID-19 infection with Pneumonia, total resolution, he has normal oxygen saturation on room air.  ID consulted 8/4. Per ID this is the last stage  of the infection, he was treated 3 days of Remdesivir given underlying lung disease, last day was 8/6.         Date of symptom onset: Approximately 1 week prior to admission.         Date of positive SARS-CoV-2 PCR: 03/14/2021       Exposure: Unknown, wife is an EMT, she is asymptomatic.        Vaccination status: Vaccinated, history below.  COVID-19 Vaccine History     Name Date Dose Route    COVID-19 VACC,MRNA,(PFIZER)(PF)(IM) 07/11/2020  1:46 PM 0.3 mL Intramuscular    Site: Right deltoid    COVID-19 VACC,MRNA,(PFIZER)(PF)(IM) 11/21/2019 0.3 mL --    External: Historical information - from other registry    COVID-19 VACC,MRNA,(PFIZER)(PF)(IM) 10/31/2019 0.3 mL Intramuscular    Site: Left deltoid    External: Historical information - from other registry              Oxygen: Room air   - Maintain SpO2 92-96%, he has normal oxygen saturations on room air.  - If indicated, Daily labs: CBC, BMP.  Metabolic panel yesterday shows slight elevation in CO2, otherwise within normal limits, renal function normal..      Hypercoagulable state in the setting of COVID-19:   Patient is at increased risk for thrombosis in the setting of acute COVID-19 infection, already on DOAC therapy given history of DVT.   - Patient is not on oxygen and should receive standard VTE pharmacologic prophylaxis, continue home Xarelto 20 mg p.o. daily.    Advanced  care planning  - Code status: FULL CODE  - Healthcare proxy: Wife       Other Medical Issues:  Cystic fibrosis related insulin-dependent diabetes mellitus on insulin:   Follows with [facility], has continuous glucose monitoring device. Glycemic control excellent since admit.  Sugars continue to be well controlled.  Patient requires high-protein high-calorie diet.  He is being discharged home tomorrow.  If sugars are elevated, he may need to adjust his long-acting and Premeal insulin.  - Lantus 50 qhs  - Lispro 30 units TID AC  - SSI Standard Scale   ??  H/o DVT:  - Xarelto 20 qd  ??  Depression:   - Fluoxetine, Melatonin, Trazodone  ??  HTN:  - Lisinopril 10 milligrams p.o. daily  ??  Chronic Low back pain   Upon review, his home opiate therapy or is not as much as he has been receiving in the hospital.  He reports having pain from his CF exacerbation.  He says his recent back surgery with laminectomy and discectomy was successful in terms of resolving his back pain.  -Robaxin prn   -Oxycodone 5 mg every 6 hours as needed, 3 more doses then discontinue.  - Gabapentin 600 TID    RLS:   - Pramipexole      DVT prophylaxis. Xarelto      Disposition: Discharge home after last dose of Zosyn and tobramycin as well as PFTs.      Please page the [facility]) pager at [phone] with questions.      Pending labs:   Pending Labs     Order Current Status    AFB culture Preliminary result          Subjective:   No complaint of fever, chills.  Breathing is at baseline.  Would like to have his port be accessed so he can shower.    Objective:   Physical Exam:    GEN: NAD, supine in bed, watching TV.  Resting comfortably.  HEENT: Mucous membranes moist, EOMI, no scleral icterus.  RESPIRATORY: No increased work of breathing.  Lungs are clear to auscultation.  CARDIOVASCULAR: Regular rate and rhythm without murmur gallop or rub.  ABDOMEN: Soft, nontender, nondistended without palpated  organomegaly  NEUROLOGIC: Alert, oriented, mental status normal to conversation.  Skin: Warm, dry, no acute rashes on visualized area.    Vital signs in last 24 hours:  Temp:  [36 ??C (96.8 ??F)-36.9 ??C (98.4 ??F)] 36.9 ??C (98.4 ??F)  Heart Rate:  [90-95] 90  Resp:  [14-18] 18  BP: (122-130)/(76-86) 122/82  MAP (mmHg):  [94-99] 94  SpO2:  [95 %-97 %] 96 %    Intake/Output last 24 hours:    Intake/Output Summary (Last 24 hours) at 03/21/2021 1535  Last data filed at 03/21/2021 1354  Gross per 24 hour   Intake 703 ml   Output 2 ml   Net 701 ml       Medications:   Scheduled Meds:  ??? albuterol  2.5 mg Nebulization 4x Daily (RT)   ??? atorvastatin  20 mg Oral Nightly   ??? cetirizine  10 mg Oral Daily   ??? dornase alfa  2.5 mg Inhalation Daily   ??? elexacaftor-tezacaftor-ivacaft  2 tablet Oral Q AM    And   ??? elexacaftor-tezacaftor-ivacaft  1 tablet Oral Q PM   ??? FLUoxetine  60 mg Oral Daily   ??? fluticasone furoate-vilanteroL  1 puff Inhalation Daily (RT)   ??? fluticasone propionate  1 spray Each Nare Daily   ??? gabapentin  600 mg Oral TID   ??? insulin glargine  50 Units Subcutaneous Nightly   ??? insulin lispro  0-12 Units Subcutaneous ACHS   ??? insulin lispro  30 Units Subcutaneous TID AC   ??? lamoTRIgine  200 mg Oral BID   ??? lisinopriL  10 mg Oral Daily   ??? melatonin  10.5 mg Oral Nightly   ??? montelukast  10 mg Oral Nightly   ??? pancrelipase (Lip-Prot-Amyl)  288,000 units of lipase Oral 3xd Meals   ??? pantoprazole  20 mg Oral Daily   ??? MVW Complete (pediatric multivit 61-D3-vit K)  2 capsule Oral BID   ??? piperacillin-tazobactam (ZOSYN) IV (intermittent)  4.5 g Intravenous Q6H SCH   ??? polyethylene glycol  17 g Oral Daily   ??? pramipexole  0.25 mg Oral Q PM   ??? rivaroxaban  20 mg Oral Daily   ??? senna  2 tablet Oral Nightly   ??? sodium chloride 7%  4 mL Nebulization 4x Daily (RT)   ??? tobramycin (NEBCIN) IVPB (conventional dosing)  260 mg Intravenous Q12H   ??? traZODone  200 mg Oral Nightly     Continuous Infusions:  ??? sodium chloride 30 mL/hr (03/21/21 1354)       Dawson Citron MD  [facility].

## 2021-03-22 LAB — TOBRAMYCIN LEVEL, TROUGH: TOBRAMYCIN TROUGH: 2.9 ug/mL (ref 0.0–2.0)

## 2021-03-22 LAB — TOBRAMYCIN LEVEL, PEAK: TOBRAMYCIN PEAK: 1.6 ug/mL — ABNORMAL LOW (ref 4.0–10.0)

## 2021-03-22 MED ORDER — BLOOD-GLUCOSE METER KIT WRAPPER
0 refills | 0.00000 days
Start: 2021-03-22 — End: 2022-05-26

## 2021-03-22 MED ORDER — CIPROFLOXACIN 500 MG TABLET
ORAL_TABLET | Freq: Two times a day (BID) | ORAL | 0 refills | 2 days | Status: CP
Start: 2021-03-22 — End: 2021-03-24

## 2021-03-22 MED ADMIN — atorvastatin (LIPITOR) tablet 20 mg: 20 mg | ORAL

## 2021-03-22 MED ADMIN — insulin lispro (HumaLOG) injection 0-12 Units: 0-12 [IU] | SUBCUTANEOUS | @ 16:00:00 | Stop: 2021-03-22

## 2021-03-22 MED ADMIN — fluticasone propionate (FLONASE) 50 mcg/actuation nasal spray 1 spray: 1 | NASAL | @ 12:00:00 | Stop: 2021-03-22

## 2021-03-22 MED ADMIN — tobramycin (NEBCIN) 260 mg in sodium chloride (NS) 0.9 % 100 mL IVPB: 260 mg | INTRAVENOUS | @ 16:00:00 | Stop: 2021-03-22

## 2021-03-22 MED ADMIN — rivaroxaban (XARELTO) tablet 20 mg: 20 mg | ORAL | @ 12:00:00 | Stop: 2021-03-22

## 2021-03-22 MED ADMIN — insulin lispro (HumaLOG) injection 30 Units: 30 [IU] | SUBCUTANEOUS | @ 12:00:00 | Stop: 2021-03-22

## 2021-03-22 MED ADMIN — montelukast (SINGULAIR) tablet 10 mg: 10 mg | ORAL

## 2021-03-22 MED ADMIN — lamoTRIgine (LaMICtal) tablet 200 mg: 200 mg | ORAL | @ 02:00:00

## 2021-03-22 MED ADMIN — FLUoxetine (PROzac) capsule 60 mg: 60 mg | ORAL | @ 12:00:00 | Stop: 2021-03-22

## 2021-03-22 MED ADMIN — piperacillin-tazobactam (ZOSYN) IVPB (premix) 4.5 g: 4.5 g | INTRAVENOUS | @ 17:00:00 | Stop: 2021-03-22

## 2021-03-22 MED ADMIN — gabapentin (NEURONTIN) capsule 600 mg: 600 mg | ORAL

## 2021-03-22 MED ADMIN — senna (SENOKOT) tablet 2 tablet: 2 | ORAL

## 2021-03-22 MED ADMIN — polyethylene glycol (MIRALAX) packet 17 g: 17 g | ORAL | @ 12:00:00 | Stop: 2021-03-22

## 2021-03-22 MED ADMIN — sodium chloride 7% nebulizer solution 4 mL: 4 mL | RESPIRATORY_TRACT | @ 13:00:00 | Stop: 2021-03-22

## 2021-03-22 MED ADMIN — pancrelipase (Lip-Prot-Amyl) (CREON) 24,000-76,000 -120,000 unit delayed release capsule 288,000 units of lipase: 288000 [IU] | ORAL | @ 12:00:00 | Stop: 2021-03-22

## 2021-03-22 MED ADMIN — melatonin tablet 10.5 mg: 10 mg | ORAL

## 2021-03-22 MED ADMIN — dornase alfa (PULMOZYME) 1 mg/mL solution 2.5 mg: 2.5 mg | RESPIRATORY_TRACT | @ 13:00:00 | Stop: 2021-03-22

## 2021-03-22 MED ADMIN — pediatric multivitamin-vit D3 1,500 unit-vit K 800 mcg (MVW COMPLETE FORMULATION) capsule: 2 | ORAL | @ 12:00:00 | Stop: 2021-03-22

## 2021-03-22 MED ADMIN — insulin glargine (LANTUS) injection 50 Units: 50 [IU] | SUBCUTANEOUS

## 2021-03-22 MED ADMIN — lisinopriL (PRINIVIL,ZESTRIL) tablet 10 mg: 10 mg | ORAL | @ 12:00:00 | Stop: 2021-03-22

## 2021-03-22 MED ADMIN — cetirizine (ZyrTEC) tablet 10 mg: 10 mg | ORAL | @ 12:00:00 | Stop: 2021-03-22

## 2021-03-22 MED ADMIN — traZODone (DESYREL) tablet 200 mg: 200 mg | ORAL

## 2021-03-22 MED ADMIN — lamoTRIgine (LaMICtal) tablet 200 mg: 200 mg | ORAL | @ 12:00:00 | Stop: 2021-03-22

## 2021-03-22 MED ADMIN — gabapentin (NEURONTIN) capsule 600 mg: 600 mg | ORAL | @ 12:00:00 | Stop: 2021-03-22

## 2021-03-22 MED ADMIN — tobramycin (NEBCIN) 260 mg in sodium chloride (NS) 0.9 % 100 mL IVPB: 260 mg | INTRAVENOUS | @ 05:00:00 | Stop: 2021-03-22

## 2021-03-22 MED ADMIN — piperacillin-tazobactam (ZOSYN) IVPB (premix) 4.5 g: 4.5 g | INTRAVENOUS | @ 10:00:00 | Stop: 2021-03-22

## 2021-03-22 MED ADMIN — insulin lispro (HumaLOG) injection 30 Units: 30 [IU] | SUBCUTANEOUS | @ 16:00:00 | Stop: 2021-03-22

## 2021-03-22 MED ADMIN — pancrelipase (Lip-Prot-Amyl) (CREON) 24,000-76,000 -120,000 unit delayed release capsule 288,000 units of lipase: 288000 [IU] | ORAL | @ 16:00:00 | Stop: 2021-03-22

## 2021-03-22 MED ADMIN — fluticasone furoate-vilanteroL (BREO ELLIPTA) 200-25 mcg/dose inhaler 1 puff: 1 | RESPIRATORY_TRACT | @ 13:00:00 | Stop: 2021-03-22

## 2021-03-22 MED ADMIN — piperacillin-tazobactam (ZOSYN) IVPB (premix) 4.5 g: 4.5 g | INTRAVENOUS | @ 03:00:00 | Stop: 2021-03-22

## 2021-03-22 MED ADMIN — pantoprazole (PROTONIX) EC tablet 20 mg: 20 mg | ORAL | @ 12:00:00 | Stop: 2021-03-22

## 2021-03-22 MED ADMIN — elexacaftor-tezacaftor-ivacaft (TRIKAFTA) 2 ORANGE tablets (PATIENT'S OWN SUPPLY): 2 | ORAL | @ 12:00:00 | Stop: 2021-03-22

## 2021-03-22 MED ADMIN — albuterol 2.5 mg /3 mL (0.083 %) nebulizer solution 2.5 mg: 2.5 mg | RESPIRATORY_TRACT | @ 13:00:00 | Stop: 2021-03-22

## 2021-03-22 MED ADMIN — pediatric multivitamin-vit D3 1,500 unit-vit K 800 mcg (MVW COMPLETE FORMULATION) capsule: 2 | ORAL

## 2021-03-22 NOTE — Unmapped (Signed)
Physician Discharge Summary    Admit date: 03/13/2021    Discharge date and time: 03/22/2021    Discharge to: Home    Discharge Service: Med Undesignated (MDX)    Discharge Attending Physician: Rica Koyanagi, MD    Discharge Diagnoses: Exacerbation of cystic fibrosis, COVID-19 infection.    Pertinent Test Results: Growth of mucoid Pseudomonas aeruginosa and Stenotrophomonas maltophilia from sputum.    Hospital Course: The patient is a 35 year old Caucasian male with a past medical history of cystic fibrosis (CF), CF related diabetes mellitus on insulin, history of DVT on Xarelto, essential hypertension, history of depression, low back pain status post recent discectomy and laminectomy with good surgical result at an outside hospital who presented to [facility] with pulmonary exacerbation of his underlying lung disease.  During the previous week before admission he had been feeling fatigued, reportedly had some sinus symptoms of headache and ear pain.  He notes that his exacerbations have presented like this previously.  The day before admission, he had some cough with mucus production during chest vest treatments, mucus.  Somewhat purulent.  He felt feverish at home and took his temperature and said it was 103 degrees Fahrenheit.  His home spirometry was down to 65%, he reports a baseline of approximately 72%.  He subsequently contacted his primary Pulmonologist who recommended the patient come to the Emergency Department at [facility].  He was subsequently admitted to the hospital for further evaluation and treatment.  The patient is a chronic indwelling port in his left upper chest for central venous access.      1.  CF exacerbation.  After presentation as above, chest x-ray showed low lung volumes with by basilar airspace opacities were consistent with infection.  CF sputum culture was patent and grew the organisms as above.  He was started on empiric treatment with intravenous tobramycin and intravenous Zosyn. The [facility] service currently participated in drug dosing and monitoring of the tobramycin levels.  The patient tolerated these medications without difficulty.  He initially had a negative COVID test but a respiratory panel with additional screening test was performed that resulted in the demonstration of COVID infection.  It was the evaluation of the Pulmonology service that this was relatively asymptomatic and that he has symptoms are due primarily to a bacterial exacerbation of his lung disease.  His case was reviewed with Infectious Disease and he was given 3 days of remdesivir.  It was noted he had onset of symptoms approximately 1 week prior to admission and therefore was thought to be at the tail end of his COVID lung infection.  The patient was started on nasal cannula oxygen as a precaution but did not demonstrate frank persistent oxygen dependence and was able to be tapered off any oxygen in 24 to 36 hours.  His oxygen saturations remained normal throughout the remainder of his hospitalization.  The patient was offered 10 days of intravenous therapy with the latter portion to be performed with Home Health infusion therapy after 3 days of admission.  He declined this opportunity.  It was recommended that he complete 10 days of therapy.  On day 8, the patient chose to leave the hospital and will complete 2 additional days of therapy with ciprofloxacin.  He was subsequently follow-up with [facility]monology and see his primary Cystic Fibrosis pulmonologist.      2.  Low back pain.  The patient recently underwent laminectomy and discectomy.  He initially reported back pain requiring oxycodone.  He  subsequently said that this pain was much improved with resolution after surgery.  He also reports having pain with previous episodes of CF exacerbation requiring oxycodone.  This was tapered off during this hospitalization.    3.  Systemic anti-coagulation secondary to DVT lower extremity.  The patient's use of chronic anticoagulation with Xarelto covered any need for anticoagulation secondary to COVID infection.  He showed no signs of abnormal bleeding and will follow-up as an outpatient for continued monitoring and dosing of his anticoagulation.    The patient's other medical problems including essential hypertension and diabetes mellitus secondary to cystic fibrosis were managed with the patient's home medical regimen and were stable during his hospitalization.      Patient Active Problem List   Diagnosis   ??? Essential hypertension   ??? Depressive disorder   ??? Mood disorder (CMS-HCC)   ??? Anxiety   ??? 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 Chronic pansinusitis   ??? Pancreatic insufficiency due to cystic fibrosis (CMS-HCC)   ??? History of Mycobacterium abscessus infection   ??? Bronchiectasis (CMS-HCC)   ??? Chronic deep vein thrombosis (DVT) of lower extremity (CMS-HCC)   ??? Cystic fibrosis with pulmonary exacerbation (CMS-HCC)   ??? Cystic fibrosis (CMS-HCC)   ??? Obesity (BMI 30-39.9)   ??? Restless leg syndrome   ??? Degeneration of lumbar intervertebral disc   ??? Lumbar radiculopathy   ??? Idiopathic peripheral neuropathy   ??? Long term current use of anticoagulant therapy   ??? Pain of lumbar spine       Condition at Discharge: stable  Discharge Medications:      Your Medication List      START taking these medications    ciprofloxacin HCl 500 MG tablet  Commonly known as: CIPRO  Take 1 tablet (500 mg total) by mouth Two (2) times a day for 4 doses.        CHANGE how you take these medications    insulin ASPART 100 unit/mL (3 mL) injection pen  Commonly known as: NovoLOG FLEXPEN  Use up to 150 units/day, divided 3 TIMES DAILY BEFORE MEALS  What changed: See the new instructions.     insulin ASPART 100 unit/mL (3 mL) injection pen  Commonly known as: NovoLOG FLEXPEN  Use up to 50 units/day, divided TID AC meals as per MD instructions  What changed: Another medication with the same name was changed. Make sure you understand how and when to take each.     insulin glargine 100 unit/mL (3 mL) injection pen  Commonly known as: BASAGLAR, LANTUS  Inject 0.7 mL (70 Units total) under the skin daily.  What changed:   ?? how much to take  ?? when to take this     insulin glargine 100 unit/mL (3 mL) injection pen  Commonly known as: BASAGLAR, LANTUS  Use up to 100 units per day as per MD instructions  What changed: Another medication with the same name was changed. Make sure you understand how and when to take each.     MVW Complete (pediatric multivit 61-D3-vit K) 1,500-800 unit-mcg Cap  Commonly known as: MVW COMPLETE FORMULATION  Take 1 capsule by mouth Two (2) times a day.  What changed: how much to take     MVW COMPLETE FORMUL PROBIOTIC 40 billion cell -15 mg Cpdr  Generic drug: Lacto-Bif-Sac-Bacil-Strep-bact  Take 1 capsule by mouth daily.  What changed: when to take this        CONTINUE taking  these medications    albuterol 90 mcg/actuation inhaler  Commonly known as: PROVENTIL HFA;VENTOLIN HFA  Inhale 2 puffs every six (6) hours as needed.     albuterol 2.5 mg /3 mL (0.083 %) nebulizer solution  Inhale 3 mL (2.5 mg total) by nebulization two (2) times a day.     amitriptyline 100 MG tablet  Commonly known as: ELAVIL  TAKE ONE TABLET BY MOUTH AT BEDTIME     atorvastatin 20 MG tablet  Commonly known as: LIPITOR  Take 20 mg by mouth nightly.     BETHKIS 300 mg/4 mL Nebu  Generic drug: tobramycin  Inhale 1 vial via nebulization every 12 hours for 28 days, alternating every other 28 days     blood-glucose meter kit  Dispense meter that is preferred by patient's insurance company     budesonide-formoteroL 160-4.5 mcg/actuation inhaler  Commonly known as: SYMBICORT  Inhale 2 puffs Two (2) times a day.     cetirizine 10 MG tablet  Commonly known as: ZyrTEC  Take 1 tablet (10 mg total) by mouth in the morning.     DEXCOM G6 RECEIVER Misc  Generic drug: blood-glucose meter,continuous  Use as directed     DEXCOM G6 SENSOR Devi  Generic drug: blood-glucose sensor  Use sq as directed every 10 days     DEXCOM G6 TRANSMITTER Devi  Generic drug: blood-glucose transmitter  Use as directed, replace every 90 days     docusate sodium 100 MG capsule  Commonly known as: COLACE  Take 100 mg by mouth in the morning. As needed while on pain meds s/p back surgery.     dornase alfa 1 mg/mL nebulizer solution  Commonly known as: PULMOZYME  Inhale 2.5 mg daily.     EASY TOUCH LANCING DEVICE Misc  Generic drug: lancing device  Use as directed.     EASY TOUCH TWIST LANCETS 30 gauge Misc  Generic drug: lancets  Use as directed.     FLUoxetine 60 mg Tab  Take 60 mg by mouth daily.     fluticasone propionate 50 mcg/actuation nasal spray  Commonly known as: FLONASE  1 spray into each nostril daily.     FREESTYLE LIBRE 14 DAY SENSOR kit  Generic drug: flash glucose sensor  Use 3-4 times daily     gabapentin 600 MG tablet  Commonly known as: NEURONTIN  Take 600 mg by mouth Three (3) times a day.     glucose blood Strp  Generic drug: blood sugar diagnostic  by Other route Three (3) times a day with a meal. Rx sent to [facility] 01/04/20     lamoTRIgine 200 MG tablet  Commonly known as: LaMICtal  Take 200 mg by mouth Two (2) times a day.     LC PLUS Misc  Generic drug: nebulizers  Use as directed with inhaled medications     lipase-protease-amylase 36,000-114,000- 180,000 unit Cpdr  Commonly known as: CREON  Take 8 caps with meals TID and 4 caps with snacks up to three times a day.     lisinopriL 10 MG tablet  Commonly known as: PRINIVIL,ZESTRIL  Take 1 tablet (10 mg total) by mouth daily.     melatonin 10 mg Tab  Take 10 mg by mouth nightly.     methocarbamoL 750 MG tablet  Commonly known as: ROBAXIN  Take 500 mg by mouth. Every 6-8 hours as needed     montelukast 10 mg tablet  Commonly known as: SINGULAIR  TAKE  ONE TABLET BY MOUTH AT BEDTIME     mupirocin 2 % ointment  Commonly known as: BACTROBAN  Apply 1 application topically Three (3) times a day. 7 days     omeprazole 20 MG capsule  Commonly known as: PriLOSEC  Take 1 capsule (20 mg total) by mouth in the morning.     oxyCODONE 5 mg capsule  Commonly known as: OXY-IR  Take 5 mg by mouth every four (4) hours as needed for pain. 5-10mg  prn pain (s/p back surgery)     pen needle, diabetic 32 gauge x 5/32 (4 mm) Ndle  Commonly known as: BD ULTRA-FINE NANO PEN NEEDLE  use up to 4 times daily     polyethylene glycol 17 gram packet  Commonly known as: MIRALAX  Take 17 g by mouth in the morning. As needed while on pain meds s/p back surgery.     pramipexole 0.125 MG tablet  Commonly known as: MIRAPEX  Take 0.25 mg by mouth every evening.     sodium chloride 7% 7 % Nebu  Inhale 4 mL by nebulization Two (2) times a day. Increase to 4 times while taking antibiotics     traZODone 150 MG tablet  Commonly known as: DESYREL  150 mg nightly.     TRIKAFTA 100-50-75 mg(d) /150 mg (n) tablet  Generic drug: elexacaftor-tezacaftor-ivacaft  Take 2 Tablets (orange) by mouth in the morning and 1 tablet (blue) in the evening with fatty food     XARELTO 20 mg tablet  Generic drug: rivaroxaban  Take 20 mg by mouth daily.               Pending Labs     Order Current Status    Tobramycin Level, Peak In process    AFB culture Preliminary result          Discharge Instructions:   Activity Instructions     Activity as tolerated          Diet Instructions     Discharge diet (specify)      Discharge Nutrition Therapy: Regular    Continue previous diet recommended by your Cystic Fibrosis doctor.             Other Instructions     Call MD for:      Worsening cough, shortness of breath, concern that cystic fibrosis is flaring up again.    Call MD for: Temperature > 38.5 Celsius ( > 101.3 Fahrenheit)        Follow Up instructions and Outpatient Referrals     Call MD for:      Call MD for: Temperature > 38.5 Celsius ( > 101.3 Fahrenheit)        Appointments which have been scheduled for you    Apr 10, 2021 11:15 AM  (Arrive by 11:00 AM)  RETURN VIDEO HCP MYCHART with Lance Sell  [facility] DIABETES AND ENDOCRINOLOGY [place] [facility]) [address]  [place] [address]  [phone]   Staff will contact you to help register you for a My [place] Chart account. Having a My [place] Chart account is important to be able to complete the visit.     Please sign into My [place] Chart at least 15 minutes before your appointment to complete any unfinished steps in the eCheck-In process. eCheck-In must be completed before starting your video visit.    Please visit [email or web address] for information about our safe promise as you receive care at  [facility].     Patients at [facility] can sign up for My [facility] Chart. My [place] Chart is a service that allows you to manage your health, send messages to your provider, view your test results, schedule and manage appointments, and request prescription refills securely and conveniently from your computer or mobile device.    If you need further assistance with activating your My [place] Chart account, rescheduling an appointment, or canceling an appointment call the [facility] at [phone].             Jun 20, 2021 10:00 AM  (Arrive by 9:45 AM)  RETURN PFT 15 with PFT 1  [facility] PULMONARY SPECIALTY FUNCT [place] [facility] REG[facility]) [address]  [place] [address]  [phone]        Jun 20, 2021 10:30 AM  (Arrive by 10:15 AM)  RETURN CYSTIC FIBROSIS with Satira Sark, MD  [facility] PULMONARY SPECIALTY CL [place] [facility] REGION) [address]  [place] [address]  [phone]        Jul 10, 2021  1:00 PM  (Arrive by 12:45 PM)  RETURN  DIABETES with Harrie Foreman, MD  [facility] DIABETES AND ENDOCRINOLOGY [place] [facility]) [address]  [place] [address]  [phone]                I spent greater than 30 minutes in the discharge of this patient.    Tawni Levy MD

## 2021-03-22 NOTE — Unmapped (Signed)
Patient resting. Port cath site is improving per patient. Rash looks better than it did yesterday. Patient denies pain. Falls precautions maintained. Huff coughing encouraged to maintain clear lung sounds.    Problem: Adult Inpatient Plan of Care  Goal: Plan of Care Review  Outcome: Progressing  Goal: Patient-Specific Goal (Individualized)  Outcome: Progressing  Goal: Absence of Hospital-Acquired Illness or Injury  Outcome: Progressing  Intervention: Identify and Manage Fall Risk  Recent Flowsheet Documentation  Taken 03/22/2021 0200 by Durwin Glaze, RN  Safety Interventions: isolation precautions  Taken 03/21/2021 2200 by Durwin Glaze, RN  Safety Interventions: isolation precautions  Taken 03/21/2021 2000 by Durwin Glaze, RN  Safety Interventions: isolation precautions  Goal: Optimal Comfort and Wellbeing  Outcome: Progressing  Goal: Readiness for Transition of Care  Outcome: Progressing  Goal: Rounds/Family Conference  Outcome: Progressing     Problem: Infection  Goal: Absence of Infection Signs and Symptoms  Outcome: Progressing  Intervention: Prevent or Manage Infection  Recent Flowsheet Documentation  Taken 03/22/2021 0200 by Durwin Glaze, RN  Isolation Precautions: spec airborne/contact precautions maintained  Taken 03/21/2021 2200 by Durwin Glaze, RN  Isolation Precautions: spec airborne/contact precautions maintained  Taken 03/21/2021 2000 by Durwin Glaze, RN  Isolation Precautions: spec airborne/contact precautions maintained     Problem: Infection (Cystic Fibrosis)  Goal: Absence of Infection Signs and Symptoms  Outcome: Progressing  Intervention: Manage Infection and Prevent Transmission  Recent Flowsheet Documentation  Taken 03/22/2021 0200 by Durwin Glaze, RN  Isolation Precautions: spec airborne/contact precautions maintained  Taken 03/21/2021 2200 by Durwin Glaze, RN  Isolation Precautions: spec airborne/contact precautions maintained  Taken 03/21/2021 2000 by Durwin Glaze, RN  Isolation Precautions: spec airborne/contact precautions maintained     Problem: Oral Intake Inadequate (Cystic Fibrosis)  Goal: Optimal Nutrition Intake  Outcome: Progressing     Problem: Malabsorption (Cystic Fibrosis)  Goal: Optimal Bowel Elimination  Outcome: Progressing     Problem: Respiratory Compromise (Cystic Fibrosis)  Goal: Effective Oxygenation and Ventilation  Outcome: Progressing  Intervention: Promote Airway Secretion Clearance  Recent Flowsheet Documentation  Taken 03/21/2021 1944 by Durwin Glaze, RN  Breathing Techniques/Airway Clearance: huff-cough encouraged  Cough And Deep Breathing: done independently per patient     Problem: Airway Clearance Ineffective  Goal: Effective Airway Clearance  Outcome: Progressing  Intervention: Promote Airway Secretion Clearance  Recent Flowsheet Documentation  Taken 03/21/2021 1944 by Durwin Glaze, RN  Breathing Techniques/Airway Clearance: huff-cough encouraged  Cough And Deep Breathing: done independently per patient     Problem: Breathing Pattern Ineffective  Goal: Effective Breathing Pattern  Outcome: Progressing  Intervention: Promote Improved Breathing Pattern  Recent Flowsheet Documentation  Taken 03/21/2021 1944 by Durwin Glaze, RN  Breathing Techniques/Airway Clearance: huff-cough encouraged     Problem: Gas Exchange Impaired  Goal: Optimal Gas Exchange  Outcome: Progressing

## 2021-03-22 NOTE — Unmapped (Signed)
Plan of the day discussed with pt this am. All abx dose given.  Pt being discharge home.  All personal belongings and medications return to pt.  Pt stated understanding of discharge instructions.  His parents pick him up.        Problem: Adult Inpatient Plan of Care  Goal: Plan of Care Review  Outcome: Discharged to Home  Goal: Patient-Specific Goal (Individualized)  Outcome: Discharged to Home  Goal: Absence of Hospital-Acquired Illness or Injury  Outcome: Discharged to Home  Intervention: Identify and Manage Fall Risk  Recent Flowsheet Documentation  Taken 03/22/2021 1000 by Danae Chen, RN  Safety Interventions:  ??? isolation precautions  ??? nonskid shoes/slippers when out of bed  Taken 03/22/2021 0800 by Danae Chen, RN  Safety Interventions:  ??? isolation precautions  ??? nonskid shoes/slippers when out of bed  Intervention: Prevent and Manage VTE (Venous Thromboembolism) Risk  Recent Flowsheet Documentation  Taken 03/22/2021 0800 by Danae Chen, RN  Activity Management:  ??? activity adjusted per tolerance  ??? activity encouraged  Goal: Optimal Comfort and Wellbeing  Outcome: Discharged to Home  Goal: Readiness for Transition of Care  Outcome: Discharged to Home  Goal: Rounds/Family Conference  Outcome: Discharged to Home     Problem: Infection  Goal: Absence of Infection Signs and Symptoms  Outcome: Discharged to Home  Intervention: Prevent or Manage Infection  Recent Flowsheet Documentation  Taken 03/22/2021 1000 by Danae Chen, RN  Isolation Precautions: spec airborne/contact precautions maintained  Taken 03/22/2021 0800 by Danae Chen, RN  Isolation Precautions: spec airborne/contact precautions maintained     Problem: Infection (Cystic Fibrosis)  Goal: Absence of Infection Signs and Symptoms  Outcome: Discharged to Home  Intervention: Manage Infection and Prevent Transmission  Recent Flowsheet Documentation  Taken 03/22/2021 1000 by Danae Chen, RN  Isolation Precautions: spec airborne/contact precautions maintained  Taken 03/22/2021 0800 by Danae Chen, RN  Isolation Precautions: spec airborne/contact precautions maintained     Problem: Oral Intake Inadequate (Cystic Fibrosis)  Goal: Optimal Nutrition Intake  Outcome: Discharged to Home     Problem: Malabsorption (Cystic Fibrosis)  Goal: Optimal Bowel Elimination  Outcome: Discharged to Home     Problem: Respiratory Compromise (Cystic Fibrosis)  Goal: Effective Oxygenation and Ventilation  Outcome: Discharged to Home     Problem: Airway Clearance Ineffective  Goal: Effective Airway Clearance  Outcome: Discharged to Home  Intervention: Promote Airway Secretion Clearance  Recent Flowsheet Documentation  Taken 03/22/2021 0800 by Danae Chen, RN  Activity Management:  ??? activity adjusted per tolerance  ??? activity encouraged     Problem: Breathing Pattern Ineffective  Goal: Effective Breathing Pattern  Outcome: Discharged to Home     Problem: Gas Exchange Impaired  Goal: Optimal Gas Exchange  Outcome: Discharged to Home

## 2021-03-22 NOTE — Unmapped (Signed)
ULTRASOUND PIV PROCEDURE NOTE    Indications:   Poor venous access.    Ultrasound guidance was necessary to obtain access.     Procedure Details:  Identity of the patient was confirmed via name, medical record number and date of birth. The availability of the correct equipment was verified.    The vein was identified and measured for ultrasound catheter insertion.       Vein measurement (without tourniquet):   0.3 cm   A(n) 22 g x 1.75 inch catheter was selected based on the recommendations below:    [facility] Catheter/Vein Ratio Guidelines    Chart to determine PIV catheter size/length to use based on vein diameter and depth   Catheter Gauge Size (g)  22g 20g 18g   Catheter length (inches)  1.75 1.75 1.75   Catheter diameter measurement (mm) 0.51mm 0.68mm 1.82mm          Minimum required vein diameter       Sonosite (cm)  0.25cm 0.3cm 0.4cm          Maximum vein depth  1.25 cm 1.25 cm 1.25 cm            The field was prepared with necessary supplies and equipment.  Probe cover and sterile gel utilized. Insertion site was prepped with chlorhexidine and allowed to dry.  The catheter extension was primed with normal saline. The [name] PIV was placed in the right forearm with 1 attempt(s).     Catheter aspirated, 10 mL blood return present. The catheter was then flushed with 10 mL of normal saline. Insertion site cleansed, and dressing applied per manufacturer guidelines. The catheter was inserted without difficulty.    Thank you,     Rodney Cruise RN    Ultrasound Resource Nurse    Workup / Procedure Time:  30 minutes

## 2021-03-23 DIAGNOSIS — K8689 Other specified diseases of pancreas: Principal | ICD-10-CM

## 2021-03-23 DIAGNOSIS — J3089 Other allergic rhinitis: Principal | ICD-10-CM

## 2021-03-23 MED ORDER — CETIRIZINE 10 MG TABLET
ORAL_TABLET | Freq: Every day | ORAL | 11 refills | 30.00000 days | Status: CP
Start: 2021-03-23 — End: 2022-03-23

## 2021-03-23 MED ORDER — LIPASE-PROTEASE-AMYLASE 36,000-114,000-180,000 UNIT CAPSULE,DELAY REL
ORAL_CAPSULE | ORAL | 11 refills | 0.00000 days | Status: CP
Start: 2021-03-23 — End: ?

## 2021-03-29 DIAGNOSIS — H9190 Unspecified hearing loss, unspecified ear: Principal | ICD-10-CM

## 2021-03-29 DIAGNOSIS — J328 Other chronic sinusitis: Principal | ICD-10-CM

## 2021-03-29 NOTE — Unmapped (Signed)
[facility]    Home Spirometry Monitoring      03/29/21        I have reviewed 1 home spirometry test(s) over prior calendar month.  Most recent test demonstrates moderate obstruction and mild restriction.  Compared to prior home spirometry values, the most recent test is unchanged. Best in-clinic FEV1 in last year was 3.38 L (72%) on 02/06/21.         Quality of most recent test graded A.     Follow-up plan:   ??? Reached out to patient via MyChart. He was recently admitted to [facility] for COVID with a pulmonary exacerbation and was unable to repeat spirometry in the hospital. Will advise to repeat at home this month.   ??? Continue monthly home spirometry monitoring for the management of cystic fibrosis.    Durenda Hurt [place], PA-C  [facility]   [phone]

## 2021-04-10 ENCOUNTER — Telehealth: Admit: 2021-04-10 | Discharge: 2021-04-11 | Payer: PRIVATE HEALTH INSURANCE

## 2021-04-10 DIAGNOSIS — J0140 Acute pansinusitis, unspecified: Principal | ICD-10-CM

## 2021-04-10 DIAGNOSIS — E089 Diabetes mellitus due to underlying condition without complications: Principal | ICD-10-CM

## 2021-04-10 NOTE — Unmapped (Signed)
[facility] ENT Encounter  This medical encounter was conducted virtually using [email or web address]  TeleHealth protocols.    Patient ID: Christian Young is a 35 y.o. male who presents by video interaction for evaluation.    I have identified myself to the patient and conveyed my credentials to [employer].   Patient has signed informed consent on file in medical record.    Present on Video Call: Is there someone else in the room? No.    Assessment/Plan:      Problem List Items Addressed This Visit        Respiratory    Acute non-recurrent pansinusitis - Primary     Symptoms of sinus pressure, headache, nasal drainage, earache.   Recently hospitalized for COVID and CF exacerbation 3 weeks ago.  Advised I would like him to try sudafed to see if this helps for a few days.  Monitor BP's while on it. If elevated, stop sudafed.  If not better, plan to start abx for sinusitis.  Follow up if symptoms persist or worsen.  Patient verbalizes understanding and has no further questions at this time.             Medication adherence and barriers to the treatment plan have been addressed. Opportunities to optimize healthy behaviors have been discussed. Patient / caregiver voiced understanding.     Subjective:     HPI  Christian Young is 35 y.o. and presents today in the [facility] with ENT symptoms.  The PCP for this patient is [facility].   Started about a week ago.  Waking up in the morning with a horrible headache, nasal drainage- discolored, ear ache.  Overall not feeling well.  Seems like sinus symptoms are worse than they started.  Pressure behind the eyes.  Taking tylenol.    Recently in the hospital and was on tobramycin and Zosyn and remdisivir.  Recent COVID infection and CF exacerbation.    ROS  Review of Systems     All other ROS per HPI.    I have reviewed the problem list, past medical history, past family history, medications, and allergies and have updated/reconciled them if needed.     Objective:   Physical Exam  As part of this Video Visit, no in-person exam was conducted.  Video interaction permitted the following observations.    General: No acute distress.   HEENT:   No visible mass or abnormality of neck.   RESP: Relaxed respiratory effort. No conversational dyspnea.   SKIN: No rashes noted.  NEURO: Normal coordination.  No tremors observed.  PSYCH: Alert and oriented.  Speech fluent and sensible.  Calm affect.       The patient reports they are currently: at home. I spent 6 minutes on the real-time audio and video with the patient on the date of service. I spent an additional 5 minutes on pre- and post-visit activities on the date of service.     The patient was physically located in [place] or a state in which I am permitted to provide care. The patient and/or parent/guardian understood that s/he may incur co-pays and cost sharing, and agreed to the telemedicine visit. The visit was reasonable and appropriate under the circumstances given the patient's presentation at the time.    The patient and/or parent/guardian has been advised of the potential risks and limitations of this mode of treatment (including, but not limited to, the absence of in-person examination) and has agreed  to be treated using telemedicine. The patient's/patient's family's questions regarding telemedicine have been answered.     If the visit was completed in an ambulatory setting, the patient and/or parent/guardian has also been advised to contact their provider???s office for worsening conditions, and seek emergency medical treatment and/or call 911 if the patient deems either necessary.

## 2021-04-10 NOTE — Unmapped (Signed)
[facility] Refill Coordination Note    Specialty Medication(s) to be Shipped:   CF/Pulmonary: -Trikafta     Other medication(s) to be shipped: No additional medications requested for fill at this time     Christian Young, DOB: Oct 26, 1985  Phone: [phone] (home)       All above HIPAA information was verified with patient.     Was a Nurse, learning disability used for this call? No    Completed refill call assessment today to schedule patient's medication shipment from the [facility] [phone]).  All relevant notes have been reviewed.     Specialty medication(s) and dose(s) confirmed: Regimen is correct and unchanged.   Changes to medications: Christian Young reports no changes at this time.  Changes to insurance: No  New side effects reported not previously addressed with a pharmacist or physician: None reported  Questions for the pharmacist: No    Confirmed patient received a Conservation officer, historic buildings and a Surveyor, mining with first shipment. The patient will receive a drug information handout for each medication shipped and additional FDA Medication Guides as required.       DISEASE/MEDICATION-SPECIFIC INFORMATION        For CF patients: CF [facility] Active? Yes  **[facility] VST til 05/06/21**    SPECIALTY MEDICATION ADHERENCE     Medication Adherence    Patient reported X missed doses in the last month: 0  Specialty Medication: Trikafta 100-50-75mg   Patient is on additional specialty medications: No  Patient is on more than two specialty medications: No  Informant: patient  Reliability of informant: reliable  Reasons for non-adherence: no problems identified  Support network for adherence: family member  Confirmed plan for next specialty medication refill: delivery by pharmacy  Refills needed for supportive medications: not needed      Trikafta  10 days worth of medication on hand.          Were doses missed due to medication being on hold? No        REFERRAL TO PHARMACIST Referral to the pharmacist: Not needed      [facility]     Shipping address confirmed in Epic.     Delivery Scheduled: Yes, Expected medication delivery date: 04/19/21.     Medication will be delivered via UPS to the prescription address in Epic WAM.    Jasper Loser   [facility] Specialty Technician

## 2021-04-10 NOTE — Unmapped (Signed)
Virtual Care Outreach Results as of:  April 10, 2021 9:31 AM        Date of Patients Video: 04/10/21  Time of Patients Video Visit: 0920  Call Attempt's (1-5): 3  Did you speak with the patient?: yes  Confirmed patient's web browser: yes  Confirmed MyChart Login: yes  Confirmed Get Started Completed : yes  Confirmed Camera: yes  Confirmed Microphone: yes  Confirmed Audio: yes  Provided Support: Yes (Lft call to allow patient to connect on an iPhone)   Reason no support provided: n/a  Patient Satisfied: yes

## 2021-04-10 NOTE — Unmapped (Signed)
DIABETES EDUCATION NOTE    Referring Provider:  Venetia Night, MD    Time In / Out: 11:20am/12:00pm    Assessment:     Christian Young is a 35 y.o. male seen via telehealth for diabetes education. Pt's wife present on call as well. Patient with Diabetes Mellitus due to underlying condition; due to cystic fibrosis on 3 or more insulin injections per day. Most recent A1c is 10.0 on 03/09/2021. Pt wanted to discuss options for insulin pumps and what that process would look like for him.      Time spent with patient was about: 40 minutes    Plan:     Education Intervention:  Discussed how insulin pump therapy works  Discussed 3 major pump companies and their products on market currently, compatibility with dexcom G6.      Educator Recommendations / Plan for Supervising Physician:  Recommended patient think decision over and contact myself and Dr. Monia Sabal to put in a prescription.  Patient reports leaning more towards omnipod and discussed potential pros and cons.       Follow up: I recommend patient follow-up with me as needed for pump training.     Subjective:     Client Information  Wife is EMT.      Diabetes History  Diabetes Diagnosis: CFRD for several years  Prior education: never been on an insulin pump      Diabetes Medications  basaglar 50 units at night  novolog insulin 30 units with meals and has sliding scale about 2:50>150      Monitoring/CGM Data:   Switched to [facility] G6, previously on libre    Reporting period: Tue Mar 28, 2021 - Mon Apr 10, 2021  -----------------------------  Glucose Details  Average glucose: 186 mg/dL  Standard deviation: 60 mg/dL  GMI: N/A  -----------------------------  Time in Range  Very High: 15%  High: 32%  In Range: 51% * not meeting >70 goal for time in range  Low: 1%  Very Low: 1%  Target Range  70-180 mg/dL  -----------------------------  CGM Details  Sensor usage: 86%  Days with CGM data: 12/14      Nutrition:  Breakfast- does not eat  Lunch- ramen noodles  Dinner- eats out at [employer]- tries to avoid snacks  Drinks- water, light cranberry juice, unsweet tea, coffee, trying to cut out diet soda   Notices coffee raises BG     Carbohydrate Counting: not counting consistently and especially when eating out will not worry about because more challenging   Special Diets: Higher protein higher calorie diet due to CF- now on trikafta so gaining more weight, recommended talking with CF team for dietary guidance  Recently downloaded MyFitnessPal- finding very helpful       Exercise:  Recently had back surgery, doing household chores and increasing physical activity      Acute Complications:  Recent Hospitalizations: called an ambulance last month for low BG, had D10 and ate to treat, no use of glucagon, accidentally took 45 units for spaghetti but did not eat as much, took basaglar at same time    Low/High Blood Glucose Concerns: has had low BG  Hyperglycemia Treatment: will take sliding scale between meals       Objective:      Past Medical History:   Diagnosis Date   ??? Anxiety    ??? Chronic pain disorder    ??? Cystic fibrosis (CMS-HCC)    ??? Depression    ???  Hypertension    ??? Lumbar radiculopathy 10/26/2020   ??? Nonproductive cough 04/05/2018         ICD-10-CM   1. Diabetes mellitus related to CF (cystic fibrosis) (CMS-HCC)  E84.8    E08.9        Lab Review:    Relevant Labs  HGB A1C, POC   Date/Time Value Ref Range Status   03/09/2021 09:04 AM 10.0 (H) <7.0 % Final     Comment:     A1c Glycemic Goal: <7.0%     **Goals should be individualized; more or less stringent A1c glycemic goals may be appropriate for individual patients.      (Adopted from: 2020 ADA Standards of Medical Care In Diabetes)  Point of Care A1c testing is not FDA-approved for the diagnosis of Diabetes.     Hemoglobin A1c   Date/Time Value Ref Range Status   01/18/2020 10:16 AM 9.4 (H) 4.8 - 5.6 % Final     Comment:              Prediabetes: 5.7 - 6.4           Diabetes: >6.4           Glycemic control for adults with diabetes: <7.0       Hemoglobin A1C   Date/Time Value Ref Range Status   11/29/2020 06:16 AM 10.7 (H) 4.8 - 5.6 % Final   09/29/2019 10:23 AM 8.8 (H) 4.8 - 5.6 % Final           HGB A1C, POC (%)   Date Value   03/09/2021 10.0 (H)     Hemoglobin A1c (%)   Date Value   01/18/2020 9.4 (H)     Hemoglobin A1C (%)   Date Value   11/29/2020 10.7 (H)   09/29/2019 8.8 (H)   08/12/2018 8.5 (H)   04/06/2018 10.7 (H)           Alita Chyle, MS, RD, LD, CDCES

## 2021-04-10 NOTE — Unmapped (Signed)
Symptoms of sinus pressure, headache, nasal drainage, earache.   Recently hospitalized for COVID and CF exacerbation 3 weeks ago.  Advised I would like him to try sudafed to see if this helps for a few days.  Monitor BP's while on it. If elevated, stop sudafed.  If not better, plan to start abx for sinusitis.  Follow up if symptoms persist or worsen.  Patient verbalizes understanding and has no further questions at this time.

## 2021-04-13 MED ORDER — OMNIPOD 5 G6 INTRO KIT (GEN 5) SUBCUTANEOUS CARTRIDGE WITH CONTROLLER
Freq: Once | SUBCUTANEOUS | 0 refills | 0 days | Status: CP
Start: 2021-04-13 — End: 2021-04-13

## 2021-04-13 MED ORDER — OMNIPOD 5 G6 PODS (GEN 5) SUBCUTANEOUS CARTRIDGE
SUBCUTANEOUS | 12 refills | 0 days | Status: CP
Start: 2021-04-13 — End: ?

## 2021-04-14 ENCOUNTER — Telehealth: Admit: 2021-04-14 | Discharge: 2021-04-15 | Payer: PRIVATE HEALTH INSURANCE

## 2021-04-14 DIAGNOSIS — J0140 Acute pansinusitis, unspecified: Principal | ICD-10-CM

## 2021-04-14 MED ORDER — AMOXICILLIN 875 MG-POTASSIUM CLAVULANATE 125 MG TABLET
ORAL_TABLET | Freq: Two times a day (BID) | ORAL | 0 refills | 10 days | Status: CP
Start: 2021-04-14 — End: 2021-04-24

## 2021-04-14 NOTE — Unmapped (Signed)
If you have any problems with your prescription if you received one or any questions after your virtual practice visit please reach out to our clinic at [phone].    *I have diagnosed you with a sinus infection.   *This can be caused by a bacteria or a virus.  *We are covering for bacterial sinusitis - Start the prescribed antibiotic.   *There are a few things that you can do to help relieve some of your sinus congestion:  -Application of moist heat to your face.  -Breathing in inhaled steam from a warm shower or a bowl of warm water.    -Also over-the-counter saline nasal spray to help clear the mucus.   -Antihistamines like Zyrtec or Claritin (generics are fine) help decrease the amount of mucus produced  -Adding steroid nasal spray like Flonase will decrease inflammation to the nasal passages.  -Mucinex (generic guaifenesin) which helps make secretions thinner and promote drainage    All the above medication recommendations are available over the counter.  *Also avoidance of tobacco smoke and environmental allergies will help with general health and decrease in nasal secretions.    *Pain control - Ibuprofen and Tylenol as needed.   *Also increase fluids and rest.  Follow-up with your primary care within 1 week to ensure symptoms are resolving.    *Return to care or go to the ER if your symptoms persist or worsen in any way.     *Thank you for choosing [facility]! It was my pleasure to participate in your medical care today.

## 2021-04-14 NOTE — Unmapped (Signed)
Virtual Care Outreach Results as of:  April 14, 2021 4:14 PM        Date of Patients Video: 04/14/21  Time of Patients Video Visit: 1640  Call Attempt's (1-5): 1  Did you speak with the patient?: yes  Confirmed patient's web browser: yes  Confirmed MyChart Login: yes  Confirmed Get Started Completed : yes  Confirmed Camera: yes  Confirmed Microphone: yes  Confirmed Audio: yes  Provided Support: Yes   Reason no support provided: n/a  Patient Satisfied: yes

## 2021-04-14 NOTE — Unmapped (Signed)
[facility] ENT Encounter  This medical encounter was conducted virtually using [email or web address]  [email or web address] protocols.    Patient ID: Christian Young is a 35 y.o. male who presents by video interaction for evaluation.    I have identified myself to the patient and conveyed my credentials to [employer].   Patient has signed informed consent on file in medical record.    Present on Video Call: Is there someone else in the room? No..    Assessment/Plan:    Diagnoses and all orders for this visit:    Acute non-recurrent pansinusitis  -     amoxicillin-clavulanate (AUGMENTIN) 875-125 mg per tablet; Take 1 tablet by mouth Two (2) times a day for 10 days.    The recent relevant medical records including hospitalization on 8/1 for cystic fibrosis exacerbation.  Did discuss frequency of sinusitis as well as frequent antibiotic use.  Did discuss importance of following up with ENT and patient did state he is scheduled in October and understands importance of keeping that visit.  We will treat with Augmentin at this time due to duration of symptoms.  Recommended ongoing symptomatic care and discussed parameters for repeat evaluation.    -- Discussed the new prescription noted above, including potential side effects, drug interactions, instructions for taking the medication, and the consequences of not taking it.  -- Patient verbalized an understanding of today's assessment and recommendations, as well as the purpose of ongoing medications.    Referred back to PCP for follow up in 3-5 days If not improving as expected or sooner as needed if getting worse    Medication adherence and barriers to the treatment plan have been addressed. Opportunities to optimize healthy behaviors have been discussed. Patient / caregiver voiced understanding.        Subjective:     HPI  Christian Young is 35 y.o. and presents today in the [facility] with ENT symptoms.  The PCP for this patient is [facility]. Pt states he has been feeling unwell for at least 7-8 days with sinus pressure, sinus congestion and ear fullness.  States he did a virtual encounter 5 days ago and was asked to continue with ongoing symptomatic care but it is not helping his symptoms.  Did do a COVID test that was negative.  No fever, no shortness of breath.  Has a history of chronic sinusitis.  No known sick contacts.        ROS  Review of Systems     All other ROS per HPI.    I have reviewed the problem list, past medical history, past family history, medications, and allergies and have updated/reconciled them if needed.          Objective:   Physical Exam  As part of this Video Visit, no in-person exam was conducted.  Video interaction permitted the following observations.    General: No acute distress.   HEENT: Bilateral frontal maxillary tenderness with patient self palpation.  No visible mass or abnormality of neck.   RESP: Relaxed respiratory effort. No conversational dyspnea.   SKIN: No rashes noted.  NEURO: Normal coordination.  No tremors observed.  PSYCH: Alert and oriented.  Speech fluent and sensible.  Calm affect.             The patient reports they are currently: at home. I spent 8 minutes on the real-time audio and video with the patient on the date of service. I spent an  additional 6 minutes on pre- and post-visit activities on the date of service.     The patient was physically located in [place] or a state in which I am permitted to provide care. The patient and/or parent/guardian understood that s/he may incur co-pays and cost sharing, and agreed to the telemedicine visit. The visit was reasonable and appropriate under the circumstances given the patient's presentation at the time.    The patient and/or parent/guardian has been advised of the potential risks and limitations of this mode of treatment (including, but not limited to, the absence of in-person examination) and has agreed to be treated using telemedicine. The patient's/patient's family's questions regarding telemedicine have been answered.     If the visit was completed in an ambulatory setting, the patient and/or parent/guardian has also been advised to contact their provider???s office for worsening conditions, and seek emergency medical treatment and/or call 911 if the patient deems either necessary.

## 2021-04-18 MED FILL — TRIKAFTA 100-50-75 MG (D)/150 MG (N) TABLETS: 28 days supply | Qty: 84 | Fill #3

## 2021-05-01 ENCOUNTER — Ambulatory Visit: Admit: 2021-05-01 | Payer: PRIVATE HEALTH INSURANCE

## 2021-05-03 MED ORDER — INSULIN ASPART U-100  100 UNIT/ML SUBCUTANEOUS SOLUTION
11 refills | 0.00000 days | Status: CP
Start: 2021-05-03 — End: ?

## 2021-05-12 ENCOUNTER — Institutional Professional Consult (permissible substitution): Admit: 2021-05-12 | Discharge: 2021-05-13 | Payer: PRIVATE HEALTH INSURANCE

## 2021-05-12 NOTE — Unmapped (Signed)
DIABETES EDUCATION NOTE    Referring Provider:  Venetia Night, MD    Time In / Out: 2:05pm/3:50pm    Assessment:      Christian Young is a 35 y.o. male here for diabetes education with wife, Christian Young. Patient with Diabetes Mellitus due to underlying condition; due to cystic fibrosis on 3 or more insulin injections per day. Most recent A1c is 10.0 on 03/09/2021. Pt comes for omnipod 5 training. Has not been on insulin pump prior.       Time spent with patient was about: 1 hour 45 minutes    Plan:     Education Intervention:  Omnipod 5 training following Pod Start Checklist  DKA prevention on pump       Educator Recommendations / Plan for Supervising Physician:  Started new Omnipod 5 insulin pump today with Dexcom G6 CGM.  Current settings are:             Basal rates- 12am-12am 1.5 units/hr   TBD =36 units   Insulin to carbohydrate ratios - 12am-12am 1: 4.5 g carbs   Insulin sensitivity factor - 12 a,-12am 1: 25 mg/dl    Active insulin duration = 4 hours   BG targets = 12am-12am 130 mg/dL   Correct above 161 mg/dl      Will adjust settings as needed.  Due to Patient taking lantus at 7pm last night, started patient on temp basal of 100% decrease for next 4 hours, recommended pt then evaluate if he still needs temp basal or if BG starting to drift up, start full basal program.  Pt connected to clinic view on glooko.      Follow up: I recommend patient follow-up with me in 1 month. 11/4 @10 :15am. Will call patient Monday and pt has my contact info as well. Has endocrinologist on call number as well in case of emergency.    Subjective:     Client Information  Lives with wife- Christian Young.       Diabetes History  Diabetes Diagnosis: CFRD  Prior education: yes      Diabetes Medications  basaglar 50 units at night  novolog 30 units with meals, CF 2:50>150      Monitoring/CGM Data:   Dexcom G6- no data today due to patient losing transmitter, but patient started new sensor and found transmitter today.       Nutrition:  Pt uses MyFitnessPal app to count carbs, has not been utilizing lately.      Exercise:  Active at job      Acute Complications:  Recent Hospitalizations: CF exacerbation 03/13/21  Low/High Blood Glucose Concerns: BG running high often  Hypoglycemia treatment: juice, soda  Has Emergency Glucagon: not prescribed.  Hyperglycemia Treatment: bolus      Objective:     Past Medical History:   Diagnosis Date   ??? Anxiety    ??? Chronic pain disorder    ??? Cystic fibrosis (CMS-HCC)    ??? Depression    ??? Hypertension    ??? Lumbar radiculopathy 10/26/2020   ??? Nonproductive cough 04/05/2018       No diagnosis found.     Lab Review:    Relevant Labs  HGB A1C, POC   Date/Time Value Ref Range Status   03/09/2021 09:04 AM 10.0 (H) <7.0 % Final     Comment:     A1c Glycemic Goal: <7.0%     **Goals should be individualized; more or less stringent A1c glycemic goals may be appropriate for  individual patients.      (Adopted from: 2020 ADA Standards of Medical Care In Diabetes)  Point of Care A1c testing is not FDA-approved for the diagnosis of Diabetes.     Hemoglobin A1c   Date/Time Value Ref Range Status   01/18/2020 10:16 AM 9.4 (H) 4.8 - 5.6 % Final     Comment:              Prediabetes: 5.7 - 6.4           Diabetes: >6.4           Glycemic control for adults with diabetes: <7.0       Hemoglobin A1C   Date/Time Value Ref Range Status   11/29/2020 06:16 AM 10.7 (H) 4.8 - 5.6 % Final   09/29/2019 10:23 AM 8.8 (H) 4.8 - 5.6 % Final           HGB A1C, POC (%)   Date Value   03/09/2021 10.0 (H)     Hemoglobin A1c (%)   Date Value   01/18/2020 9.4 (H)     Hemoglobin A1C (%)   Date Value   11/29/2020 10.7 (H)   09/29/2019 8.8 (H)   08/12/2018 8.5 (H)   04/06/2018 10.7 (H)           Alita Chyle, MS, RD, LD, CDCES

## 2021-05-15 NOTE — Unmapped (Signed)
[facility] Refill Coordination Note    Specialty Medication(s) to be Shipped:   CF/Pulmonary: -Trikafta 100-50-75mg   Other medication(s) to be shipped: No additional medications requested for fill at this time     Christian Young, DOB: 02/08/1986  Phone: [phone] (home)     All above HIPAA information was verified with patient.     Was a Nurse, learning disability used for this call? No    Completed refill call assessment today to schedule patient's medication shipment from the [facility] [phone]).  All relevant notes have been reviewed.     Specialty medication(s) and dose(s) confirmed: Regimen is correct and unchanged.   Changes to medications: Christian Young reports no changes at this time.  Changes to insurance: No  New side effects reported not previously addressed with a pharmacist or physician: None reported  Questions for the pharmacist: No    Confirmed patient received a Conservation officer, historic buildings and a Surveyor, mining with first shipment. The patient will receive a drug information handout for each medication shipped and additional FDA Medication Guides as required.       DISEASE/MEDICATION-SPECIFIC INFORMATION        For CF patients: CF [facility] Active? Yes, **HWG VST til 05/06/22**    SPECIALTY MEDICATION ADHERENCE     Medication Adherence    Patient reported X missed doses in the last month: 0  Specialty Medication: Trikafta 100-50-75mg   Patient is on additional specialty medications: No  Patient is on more than two specialty medications: No  Informant: patient  Reliability of informant: reliable  Reasons for non-adherence: no problems identified  Support network for adherence: family member  Confirmed plan for next specialty medication refill: delivery by pharmacy  Refills needed for supportive medications: not needed        Were doses missed due to medication being on hold? No    Trikafta 100-50-75mg  : 6 days of medicine on hand     REFERRAL TO PHARMACIST Referral to the pharmacist: Not needed    [facility]     Shipping address confirmed in Epic.     Delivery Scheduled: Yes, Expected medication delivery date: 05/18/2021.     Medication will be delivered via UPS to the prescription address in Epic WAM.    Christian Young [facility] Specialty Technician

## 2021-05-15 NOTE — Unmapped (Signed)
Called patient to follow up on new Omnipod 5 insulin pump started on Friday 9/30. Patient's BG continued to run high over the weekend. Patient reports he has started looking up carb amount of food he is eating more. 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 On Sunday, pt went in 80s-90s after bolusing 28.2 units for 120 g carbsworth of pizza. Pt felt jittery and hypoglycemic symptoms, so ate 2 more pieces of pizza and then went high in 300s. Pt denies having concerns/questions about how the omnipod system works at this time.     Current settings are:                        Basal rates- 12am-12am 1.5 units/hr (Increased to 1.6 units/hr)              TBD =36 units ( increased to 38.4 units/day)              Insulin to carbohydrate ratios - 12am-12am 1: 4.5 g carbs              Insulin sensitivity factor - 12 a,-12am 1: 25 mg/dl               Active insulin duration = 4 hours              BG targets = 12am-12am 130 mg/dL              Correct above 150 mg/dl     Dexcom for last 3 days:   Reporting period: Fri May 12, 2021 - Mon May 15, 2021  -----------------------------  Average glucose: 241 mg/dL  Standard deviation: 66 mg/dL  Time in Range  Very High: 49%  High: 32%  In Range: 19%  Low: 0%  Very Low: 0%    Recommended to increase basal rate to 1.6 units/hr- 38.4 units/day. Will change pod today, recommended to go into auto mode. Also discussed appropriate ways to treat hypoglycemia without resulting in hyperglycemia I.e with rule of 15. Will continue to be available to patient and will follow up Wednesday via phone.

## 2021-05-17 MED FILL — TRIKAFTA 100-50-75 MG (D)/150 MG (N) TABLETS: 28 days supply | Qty: 84 | Fill #4

## 2021-05-17 NOTE — Unmapped (Signed)
Attempted to call patient for follow up s/p new omnipod pump start 9/30- left voicemail. Appears from dexcom and glooko report that pt has not bolused in 2 days, which is also when started auto mode. Unsure if there was misunderstanding about bolusing while in auto mode, will review auto mode features with patient. Pt has my contact information to call back.

## 2021-05-18 NOTE — Unmapped (Signed)
Called patient to follow up. Pt's BG have been running high and glooko report pt had not bolused since Monday. Pt misunderstood that being in auto mode meant did not have to bolus for carbs or do corrections if needed. Educated and reviewed on auto mode features- cleared up that pt must bolus to cover carbs even in auto mode. Pt changed pod last night and then had high BG this morning along with pod error so took off pod. Pt at work and plan to put on new pod when he gets home. Current BG according to dexcom Is 224mg /dl. Recommended pt bolus a correction dose using bolus calculator when he gets home. Also reviewed what to do in case of unexplained high BG while on pump. Will send pt written instructions via MyChart. Also recommend patient review what pod alarms, alerts, and notifications mean- sent informational video. Will follow up as needed.

## 2021-05-23 NOTE — Unmapped (Signed)
Called patient to follow up on new omnipod 5 pump. Pt noticed there is 30 units max bolus and several times he has noticed his meal will need more than this based on the carbs he has looked up when eating out. Pt reports this is a good reminder for him to reconsider the amount of food he is about to eat. Recommended pt eat what he can bolus for to avoid hyperglycemia.   Over the weekend, patient accidentally knocked off pump at work so went without insulin for several hours. Patient learned that he should place pump on legs/abdomen and not arms, so it will not get ripped off. Patient now has back up plan to carry novolog insulin pen with him at work just in case. Reviewed what to do in case- dose novolog at 3 hours worth of basal rate at a time (1.6 units/hr x 3= take 5 units novolog every 3 hours) until he can get new pod on. Contact clinic nurse line if has questions about this/ urgent need for guidance) No changes to pump settings made at this time.

## 2021-05-25 NOTE — Unmapped (Unsigned)
Otolaryngology New Consultation Visit Note      Reason for Visit:  Tinnitus     History of Present Illness    Christian Young is 35 y.o. male being seen in consultation at the request of Orlean Bradford PA for evaluation of tinnitus. Patient has CF. Prior aminoglycoside use- He has not been on Tobi for many months as he was told to hold off due to tinnitus and hearing loss. His sinuses are doing well, and he continues to use Flonase.     Onset:   Pulsatile:   Stable:   Laterality:   Hearing loss:   Vertigo:   Ear fullness or pain:  History of prior events:   History of noise induced exposure:   History of ear trauma or head trauma:    History of ear surgeries or infections:   Heart disease or CAD:   Medications:        The patient denies fevers, chills, shortness of breath, chest pain, nausea, vomiting, diarrhea, inability to lie flat, dysphagia, odynophagia, hemoptysis, hematemesis, changes in vision, changes in voice quality, otalgia, otorrhea, vertiginous symptoms, focal deficits, or other concerning symptoms.    Past Medical History     has a past medical history of Anxiety, Chronic pain disorder, Cystic fibrosis (CMS-HCC), Depression, Hypertension, Lumbar radiculopathy (10/26/2020), and Nonproductive cough (04/05/2018).    Past Surgical History     has a past surgical history that includes pr removal of lung,lobectomy (Right, 03/29/2017) and IR Insert Port Age Greater Than 5 Years (03/27/2019).    Current Medications    Current Outpatient Medications   Medication Sig Dispense Refill   ??? albuterol (PROVENTIL HFA;VENTOLIN HFA) 90 mcg/actuation inhaler Inhale 2 puffs every six (6) hours as needed. 1 Inhaler 1   ??? albuterol 2.5 mg /3 mL (0.083 %) nebulizer solution Inhale 3 mL (2.5 mg total) by nebulization two (2) times a day. 540 mL 3   ??? amitriptyline (ELAVIL) 100 MG tablet TAKE ONE TABLET BY MOUTH AT BEDTIME (Patient taking differently: Take 100 mg by mouth nightly.) 30 tablet 1 ??? atorvastatin (LIPITOR) 20 MG tablet Take 20 mg by mouth nightly.     ??? BETHKIS 300 mg/4 mL Nebu Inhale 1 vial via nebulization every 12 hours for 28 days, alternating every other 28 days 224 mL 6   ??? blood sugar diagnostic (GLUCOSE BLOOD) Strp by Other route Three (3) times a day with a meal. Rx sent to [facility] 01/04/20     ??? blood-glucose meter kit Dispense meter that is preferred by patient's insurance company 1 each 0   ??? blood-glucose meter,continuous (DEXCOM G6 RECEIVER) Misc Use as directed 1 each 0   ??? blood-glucose sensor (DEXCOM G6 SENSOR) Devi Use sq as directed every 10 days 9 each 11   ??? blood-glucose transmitter (DEXCOM G6 TRANSMITTER) Devi Use as directed, replace every 90 days 1 each 11   ??? budesonide-formoteroL (SYMBICORT) 160-4.5 mcg/actuation inhaler Inhale 2 puffs Two (2) times a day. 30.6 g 3   ??? cetirizine (ZYRTEC) 10 MG tablet Take 1 tablet (10 mg total) by mouth daily. 30 tablet 11   ??? docusate sodium (COLACE) 100 MG capsule Take 100 mg by mouth in the morning. As needed while on pain meds s/p back surgery.     ??? dornase alfa (PULMOZYME) 1 mg/mL nebulizer solution Inhale 2.5 mg daily. 450 mL 3   ??? EASY TOUCH LANCING DEVICE Misc Use as directed.     ??? EASY TOUCH  TWIST LANCETS 30 gauge Misc Use as directed.     ??? elexacaftor-tezacaftor-ivacaft (TRIKAFTA) 100-50-75 mg(d) /150 mg (n) tablet Take 2 Tablets (orange) by mouth in the morning and 1 tablet (blue) in the evening with fatty food 84 tablet 6   ??? FLUoxetine 60 mg Tab Take 60 mg by mouth daily.      ??? fluticasone propionate (FLONASE) 50 mcg/actuation nasal spray 1 spray into each nostril daily.     ??? FREESTYLE LIBRE 14 DAY SENSOR kit Use 3-4 times daily     ??? gabapentin (NEURONTIN) 600 MG tablet Take 600 mg by mouth Three (3) times a day.     ??? insulin ASPART (NOVOLOG FLEXPEN) 100 unit/mL (3 mL) injection pen Use up to 150 units/day, divided 3 TIMES DAILY BEFORE MEALS (Patient taking differently: Inject 30 Units under the skin Three (3) times a day before meals. Plus sliding scale) 45 mL 0   ??? insulin ASPART (NOVOLOG FLEXPEN) 100 unit/mL (3 mL) injection pen Use up to 50 units/day, divided TID AC meals as per MD instructions 45 mL 12   ??? insulin ASPART (NOVOLOG U-100 INSULIN ASPART) 100 unit/mL injection Subcutaneously infuse up to 150 units daily via insulin pump 150 mL 11   ??? insulin glargine (BASAGLAR, LANTUS) 100 unit/mL (3 mL) injection pen Inject 0.7 mL (70 Units total) under the skin daily. (Patient taking differently: Inject 50 Units under the skin nightly.) 30 mL 0   ??? insulin glargine (BASAGLAR, LANTUS) 100 unit/mL (3 mL) injection pen Use up to 100 units per day as per MD instructions 90 mL 12   ??? insulin pump cart,automated,BT (OMNIPOD 5 G6 PODS, GEN 5,) Crtg 1 each by subcutaneous (via wearable injector) route every three (3) days. Change every 72 hour 10 each 12   ??? lamoTRIgine (LAMICTAL) 200 MG tablet Take 200 mg by mouth Two (2) times a day.     ??? lipase-protease-amylase (CREON) 36,000-114,000- 180,000 unit CpDR Take 8 caps with meals TID and 4 caps with snacks up to three times a day. 1200 capsule 11   ??? lisinopriL (PRINIVIL,ZESTRIL) 10 MG tablet Take 1 tablet (10 mg total) by mouth daily. 30 tablet 0   ??? melatonin 10 mg Tab Take 10 mg by mouth nightly.     ??? methocarbamoL (ROBAXIN) 750 MG tablet Take 500 mg by mouth. Every 6-8 hours as needed     ??? montelukast (SINGULAIR) 10 mg tablet TAKE ONE TABLET BY MOUTH AT BEDTIME (Patient taking differently: Take 10 mg by mouth nightly.) 90 tablet 3   ??? mupirocin (BACTROBAN) 2 % ointment Apply 1 application topically Three (3) times a day. 7 days     ??? MVW COMPLETE FORMUL PROBIOTIC 40 billion cell -15 mg CpDR Take 1 capsule by mouth daily. (Patient taking differently: Take 1 capsule by mouth daily.) 90 capsule 3   ??? MVW Complete, pediatric multivit 61-D3-vit K, (MVW COMPLETE FORMULATION) 1,500-800 unit-mcg cap Take 1 capsule by mouth Two (2) times a day. (Patient taking differently: Take 2 capsules by mouth Two (2) times a day.)     ??? nebulizers Misc Use as directed with inhaled medications 4 each 3   ??? omeprazole (PRILOSEC) 20 MG capsule Take 1 capsule (20 mg total) by mouth in the morning. 90 capsule 3   ??? oxyCODONE (OXY-IR) 5 mg capsule Take 5 mg by mouth every four (4) hours as needed for pain. 5-10mg  prn pain (s/p back surgery)     ??? pen needle,  diabetic (BD ULTRA-FINE NANO PEN NEEDLE) 32 gauge x 5/32 (4 mm) Ndle use up to 4 times daily 400 each 12   ??? polyethylene glycol (MIRALAX) 17 gram packet Take 17 g by mouth in the morning. As needed while on pain meds s/p back surgery.     ??? pramipexole (MIRAPEX) 0.125 MG tablet Take 0.25 mg by mouth every evening.      ??? sodium chloride 7% 7 % Nebu Inhale 4 mL by nebulization Two (2) times a day. Increase to 4 times while taking antibiotics     ??? traZODone (DESYREL) 150 MG tablet 150 mg nightly.     ??? XARELTO 20 mg tablet Take 20 mg by mouth daily.       No current facility-administered medications for this visit.       Allergies    Allergies   Allergen Reactions   ??? Aztreonam Anaphylaxis, Hives, Nausea And Vomiting and Rash     fevers  Patient stated that he only vomited x1 with [place] in the past.????    fevers  Patient stated that he only vomited x1 with [place] in the past.????   ??? [place] [Aztreonam Lysine] Anaphylaxis   ??? Cefepime Itching, Nausea Only and Other (See Comments)     Headaches also   ??? Other Anaphylaxis and Other (See Comments)     Other reaction(s): Other (See Comments)  Bananas: itchy throat  Slo Bid record from [employer] states anaphylaxis.????Pt states this was from childhood and does not know reaction.  Bananas, causes itchy throat   ??? Slo-Bid 100 Anaphylaxis   ??? Tobramycin Tinnitus     From OSH record-documented as tinnitus but has received IV tobra with close monitoring.  Pt has received Tobramycin at [facility] since this allergy documented    From OSH record-documented as tinnitus but has received IV tobra with close monitoring.   ??? Banana Itching and Nausea And Vomiting       Family History    family history includes Bipolar disorder in his mother; Depression in his mother.    Social History:     reports that he has quit smoking. His smoking use included e-cigarettes. He has never used smokeless tobacco.   reports previous alcohol use of about 3.0 standard drinks of alcohol per week.   reports no history of drug use.    Review of Systems    A full 12-system review of systems is documented and negative except as noted in HPI.  Patient intake form reviewed.    Vital Signs  There were no vitals taken for this visit.    Physical Exam    General: Well-developed, well-nourished. Appropriate, comfortable, and in no apparent distress.  Voice: clear   Head/Face: On external examination there is no obvious asymmetry or scars. On palpation there is no masses within the salivary glands. Cranial nerves V and VII are intact through all distributions.  Eyes: PERRL, EOMI, the conjunctiva are not injected and sclera is non-icteric.  Ears:   Right ear: On external exam, there is no obvious lesions or asymmetry. No cerumen. No lesions in the EAC. The TM is in the neutral position and mobile to pneumatic otoscopy. No middle ear masses or fluid noted.   Hearing is grossly intact.  Left ear: On external exam, there is no obvious lesions or asymmetry. No cerumen. No lesions in the EAC. The TM is in the neutral position and mobile to pneumatic otoscopy. No middle ear masses or fluid noted.  Hearing is grossly intact.  Nose: No external masses, lesions, or deformity. Anterior rhinoscopy of the nasal mucosa, septum, and turbinates reveals a normal exam.   Oral cavity/oropharynx: The mucosa of the lips, gums, hard and soft palate, posterior pharyngeal wall, tongue, floor of mouth, and buccal region are without masses or lesions and are normally hydrated. Good dentition. Tongue protrudes midline. Tonsils are symmetric without any masses or lesions. Supraglottis not visualized due to gag reflex.  Neck: There is no asymmetry or masses. Trachea is midline. There is no enlargement of the thyroid or palpable thyroid nodules.   Lymphatics: There is no palpable lymphadenopathy along the jugulodiagastric, submental, or posterior cervical chains.  Chest: No audible wheeze, unlabored respirations.  Neurologic: Cranial nerve???s II-XII are grossly intact. Exam is non-focal.  Extremities: No cyanosis, clubbing or edema.    Procedures:  ***    Audiogram  The audiogram was reviewed. This demonstrates a ***    Imaging  I personally reviewed the patients imaging studies. ***    Outside Medical Records  I personally reviewed patient's medical records from the referring provider.     Assessment/Recommendations:    The patient is a 35 y.o. male with a history of  has a past medical history of Anxiety, Chronic pain disorder, Cystic fibrosis (CMS-HCC), Depression, Hypertension, Lumbar radiculopathy (10/26/2020), and Nonproductive cough (04/05/2018). who presents for the evaluation of tinnitus. Discussed with patient that the most common causes of tinnitus are hearing loss and medications. Patient is not taking aspirin and is not on any other medication that usually causes tinnitus. Discussed also with patient that tinnitus can sometimes be hard to treat. If there is hearing loss, patient can try hearing aids. Other treatment options include masking devices, behavior therapies and mood stabilizer medications. Discussed that sound machines can help at night. Also discussed that there is an over-the-counter medication called lipoflavonoid but it is not FDA approved and the data is mixed.     On audiogram patient has ***. Recommend ***    Since patient has bilateral non-pulsatile tinnitus, no imaging is recommended based on guidelines.     Since patient has unilateral nonpulsatile tinnitus, I recommend a MRI with contrast of the temporal bone to evaluate.  I will call the patient with the results.      Since patient has unilateral pulsatile tinnitus, I recommend a MRI/MRA with contrast of the temporal bone to evaluate. Could consider a CT temporal bone as well to rule out SSCD. Will hold off on that for now as symptoms do not sound like SSCD. I will call the patient with the results.       The patient voiced complete understanding of plan as detailed above and is in full agreement.

## 2021-05-25 NOTE — Unmapped (Signed)
Called patient to follow up- did not answer, so left voicemail. Recommend patient bolus 15 minutes ahead of meal to prevent mealtime hyperglycemia. Yesterday, pt's BG was 71% in range. Pt has contact info to follow up later.

## 2021-05-26 NOTE — Unmapped (Signed)
Patient does not need a refill of specialty medication at this time. Moving specialty refill reminder call to appropriate date and removed call attempt data.  Last fill on [facility] was 05/17/2021.

## 2021-06-01 NOTE — Unmapped (Signed)
Called patient to follow up. Pt reports he did not wear pod or dexcom for 3 days because he misplaced transmitter; but did find it and is using again. Patient switched to using lantus and novolog and BGM during the 3 days. Now wearing omnipod 5 system again. Pt reports he feels like omnipod algorithm is learning him well and when he does not forget to bolus before meal it does really well at keeping him close to target BG of 130mg /dl. Pt trying to focus on bolusing before meals. Pt also is doing well looking up carb amounts of foods. Pt notices he may have to do extra boluses if eats sugary items like candy. Educated that for Omnipod 5, he can use in manual mode even without Dexcom G6 CGM. Let patient know I have replacement transmitters if he happened to lose it again. Recommended bolusing 15 minutes prior to eating. Will continue to be available as needed.

## 2021-06-02 DIAGNOSIS — J324 Chronic pansinusitis: Principal | ICD-10-CM

## 2021-06-02 MED ORDER — BUDESONIDE-FORMOTEROL HFA 160 MCG-4.5 MCG/ACTUATION AEROSOL INHALER
Freq: Two times a day (BID) | RESPIRATORY_TRACT | 3 refills | 46 days | Status: CP
Start: 2021-06-02 — End: 2022-06-02

## 2021-06-02 MED ORDER — MONTELUKAST 10 MG TABLET
ORAL_TABLET | Freq: Every evening | ORAL | 3 refills | 90 days | Status: CP
Start: 2021-06-02 — End: ?

## 2021-06-05 ENCOUNTER — Ambulatory Visit: Admit: 2021-06-05 | Payer: PRIVATE HEALTH INSURANCE

## 2021-06-05 ENCOUNTER — Ambulatory Visit: Admit: 2021-06-05 | Payer: PRIVATE HEALTH INSURANCE | Attending: Audiologist | Primary: Audiologist

## 2021-06-06 MED ORDER — LEVOFLOXACIN 750 MG TABLET
ORAL_TABLET | Freq: Every day | ORAL | 0 refills | 14.00000 days | Status: CP
Start: 2021-06-06 — End: 2021-06-20

## 2021-06-06 NOTE — Unmapped (Signed)
Christian Young left a message with this Financial planner. Requesting a return call as he was under the weather.     Date of symptom onset:  Past couple of weeks, stuffy nose and cough for two days and then go away (if he takes OTC medications) and he feels better. Sunday symptoms worsened, coughing up real nasty stuff, and having lot of congestion in his chest, coughing a lot, throat. Today woke up and worse, can barely speak.     Agreeable trying oral antibiotics.     General Symptoms:  Fever:     yes --low grade Monday and Tuesday    Chills:     no   Nightsweats:    no more than usual    Change in appetite:   no   Change in Energy Level:  yes    Pulmonary Symptoms:   Feels like it is in his chest more than is sinuses    Increased cough:   yes   Cough Mild or Significant:  significant   Cough:     wet (productive)    Sputum Description     sage or olive green (normal yellow sputum)   Hemoptysis     no   Chest pain    yes--lung pain both sides--tightness /all over from coughing    Chest tightness   yes   Wheezing:     yes--mild    Short of breath with activity:     yes    Sinus Symptoms:  Not blowing nose a lot, coughing fit it, will flow nose     Neb Treatments:   Albuterol:     three times a day  Requiring additional albuterol:  no  Hypertonic Saline:    two times a day  Pulmozyme:     daily  Currently on inhaled antibiotics:   no--though would be agreeable to start back in inhaled tobramycin          none       Inhaled Steroids     Symbicort    Airway Clearance:      Vest    Preferred Pharmacy:   [facility] in [place]    Requested to use his home spirometer.     Will route to Dr [name] for advice.     Christian Flake [name], RN  CF Nurse Coordinator   [phone]

## 2021-06-06 NOTE — Unmapped (Signed)
Called patient to discuss current symptoms (see note from Norlene Duel earlier today 06/06/21)  Symptoms started over weekend though somewhat on/off increased symptoms for several weeks  No fevers, currently but has felt feverish  Cough currently bringing up nasty stuff - olive green, dark - no hemoptysis  Sinuses are ok, some congestion and stuffy nose  Chest pain with coughing but not persistent otherwise  Maintaining hydration, doing increased airway clearance  Wife sick as well recently   Discussed starting levaquin now (Steno sens to levaquin on last culture, prior pseudomonas fairly resistant but one strain intermediate to levaquin; plan for 14 day course) - did do home spirometer but now can't pull up prior results, he iwll repeat in the coming days  Reasons to go to ER reviewed, he voiced that if not feeling better by Friday he may prefer to come into hospital for IV antibiotics - will plan to touch base on Friday, and he will reach out of symptoms worsening/changing before then

## 2021-06-07 NOTE — Unmapped (Signed)
Kenniel noted to have completed home spirometry per this writer's request.     FEV1 47%     Oral antibiotics prescribed by Dr [name].             [name], RN  CF Nurse Coordinator   [phone]

## 2021-06-08 ENCOUNTER — Ambulatory Visit: Admit: 2021-06-08 | Payer: PRIVATE HEALTH INSURANCE

## 2021-06-08 ENCOUNTER — Ambulatory Visit
Admit: 2021-06-08 | Discharge: 2021-06-16 | Disposition: A | Discharge status: Home / Self Care | Payer: PRIVATE HEALTH INSURANCE

## 2021-06-08 LAB — COMPREHENSIVE METABOLIC PANEL
ALBUMIN: 4.2 g/dL (ref 3.4–5.0)
ALKALINE PHOSPHATASE: 147 U/L — ABNORMAL HIGH (ref 46–116)
ALT (SGPT): 29 U/L (ref 10–49)
ANION GAP: 11 mmol/L (ref 5–14)
AST (SGOT): 18 U/L (ref <=34)
BILIRUBIN TOTAL: 0.4 mg/dL (ref 0.3–1.2)
BLOOD UREA NITROGEN: 12 mg/dL (ref 9–23)
BUN / CREAT RATIO: 15
CALCIUM: 9.7 mg/dL (ref 8.7–10.4)
CHLORIDE: 96 mmol/L — ABNORMAL LOW (ref 98–107)
CO2: 25.1 mmol/L (ref 20.0–31.0)
CREATININE: 0.79 mg/dL
EGFR CKD-EPI (2021) MALE: >90 mL/min/{1.73_m2} (ref >=60)
GLUCOSE RANDOM: 391 mg/dL — ABNORMAL HIGH (ref 70–179)
POTASSIUM: 4.3 mmol/L (ref 3.4–4.8)
PROTEIN TOTAL: 8.3 g/dL — ABNORMAL HIGH (ref 5.7–8.2)
SODIUM: 132 mmol/L — ABNORMAL LOW (ref 135–145)

## 2021-06-08 LAB — PROTIME-INR
INR: 1.29
PROTIME: 14.7 s — ABNORMAL HIGH (ref 9.8–12.8)

## 2021-06-08 LAB — CBC W/ AUTO DIFF
BASOPHILS ABSOLUTE COUNT: 0.1 10*9/L (ref 0.0–0.1)
BASOPHILS RELATIVE PERCENT: 1.1 %
EOSINOPHILS ABSOLUTE COUNT: 0.2 10*9/L (ref 0.0–0.5)
EOSINOPHILS RELATIVE PERCENT: 1.9 %
HEMATOCRIT: 42.2 % (ref 39.0–48.0)
HEMOGLOBIN: 14.1 g/dL (ref 12.9–16.5)
LYMPHOCYTES ABSOLUTE COUNT: 2 10*9/L (ref 1.1–3.6)
LYMPHOCYTES RELATIVE PERCENT: 17 %
MEAN CORPUSCULAR HEMOGLOBIN CONC: 33.4 g/dL (ref 32.0–36.0)
MEAN CORPUSCULAR HEMOGLOBIN: 27.1 pg (ref 25.9–32.4)
MEAN CORPUSCULAR VOLUME: 81.1 fL (ref 77.6–95.7)
MEAN PLATELET VOLUME: 8.2 fL (ref 6.8–10.7)
MONOCYTES ABSOLUTE COUNT: 0.9 10*9/L — ABNORMAL HIGH (ref 0.3–0.8)
MONOCYTES RELATIVE PERCENT: 7.3 %
NEUTROPHILS ABSOLUTE COUNT: 8.7 10*9/L — ABNORMAL HIGH (ref 1.8–7.8)
NEUTROPHILS RELATIVE PERCENT: 72.7 %
NUCLEATED RED BLOOD CELLS: 0 /100{WBCs} (ref <=4)
PLATELET COUNT: 360 10*9/L (ref 150–450)
RED BLOOD CELL COUNT: 5.21 10*12/L (ref 4.26–5.60)
RED CELL DISTRIBUTION WIDTH: 15.3 % — ABNORMAL HIGH (ref 12.2–15.2)
WBC ADJUSTED: 12 10*9/L — ABNORMAL HIGH (ref 3.6–11.2)

## 2021-06-08 LAB — HIGH SENSITIVITY TROPONIN I - SERIAL: HIGH SENSITIVITY TROPONIN I: <3 ng/L (ref <=53)

## 2021-06-08 LAB — HIGH SENSITIVITY TROPONIN I - 2 HOUR SERIAL
HIGH SENSITIVITY TROPONIN - DELTA (0-2H): 0 ng/L (ref <=7)
HIGH-SENSITIVITY TROPONIN I - 2 HOUR: <3 ng/L (ref <=53)

## 2021-06-08 LAB — HEMOGLOBIN A1C
ESTIMATED AVERAGE GLUCOSE: 212 mg/dL
HEMOGLOBIN A1C: 9 % — ABNORMAL HIGH (ref 4.8–5.6)

## 2021-06-08 MED ADMIN — piperacillin-tazobactam (ZOSYN) IVPB (premix) 4.5 g: 4.5 g | INTRAVENOUS | @ 21:00:00 | Stop: 2021-06-08

## 2021-06-08 MED ADMIN — MORPhine 4 mg/mL injection 4 mg: 4 mg | INTRAVENOUS | @ 22:00:00 | Stop: 2021-06-08

## 2021-06-08 NOTE — Unmapped (Signed)
ED Progress Note    [facility]  Emergency Department Medical Screening Examination     Subjective     Christian Young is a 35 y.o. male presenting for evaluation of Shortness of Breath, cough,congestion, and coughing spell that resulted in syncope yesterday. Syncope resulted in fall with head injury. Pt is on thinners.      Abbreviated Review of Systems/Covid Screen  Constitutional: Positive for fever  Respiratory: Positive for cough. Positive for difficulty breathing.    Objective     ED Triage Vitals   Enc Vitals Group      BP 06/08/21 1527 159/87      Pulse --       SpO2 Pulse 06/08/21 1527 115      Resp 06/08/21 1527 24      Temp 06/08/21 1526 37 ??C (98.6 ??F)      Temp Source 06/08/21 1526 Oral      SpO2 06/08/21 1527 95 %      Weight --       Height --       Head Circumference --       Peak Flow --       Pain Score --       Pain Loc --       Pain Edu? --       Excl. in GC? --         Focused Physical Exam  Constitutional: No acute distress.  Respiratory: Non-labored respirations.  Neurological: Clear speech. No gross focal neurologic deficits are appreciated  ?  Assessment & Plan     Plan for labs, cxr, RPP, and CT head    A medical screening exam has been performed. At the time of this evaluation, no emergency medical condition requiring immediate stabilization has been identified nor is there suspicion for imminent decompensation. Appropriate triage protocols will be implemented and a comprehensive ED evaluation with disposition will be completed by a healthcare provider when an appropriate ED location becomes available. The patient is aware that this is an initial encounter only and verbalizes understanding and agreement with the plan.     Emergency Department operations continue to be impacted by the COVID-19 pandemic.     Rande Lawman, PA  June 08, 2021 3:32 PM

## 2021-06-08 NOTE — Unmapped (Signed)
ED Attending Note  [facility] Emergency Department   I saw the patient on 06/08/2021.      Patient Name: Christian Young  Chief Complaint: Shortness of Breath        Patient Name: Christian Young  Chief Complaint: Shortness of Breath            ED Clinical Impression     Final diagnoses:   Productive cough (Primary)   Shortness of breath   Chest pain, unspecified type           Impression, ED Course, Assessment and Plan   Medical Decision Making  Omaree Fuqua is a 35 y.o. male with history of CF (on trikafta), CF related diabetes, and DVT (on Xarelto) presenting with few days of worsening productive cough, shortness of breath, and chest pain resulting in a fall yesterday hitting his head after a fit of cough.    His vitals are notable for mild tachypnea to 24. On exam, good airway movements. Coarse breath sounds in bilateral bases. Tachycardic. No murmurs. No bilateral LE edema. Abdomen soft, and non tender.     ED Course:      Plan for admission for workup and treatment of CF exacerbation.  Pulmonology recommending zosyn and IV tobramycin initiated.  Given syncopal event with head trauma, CT head obtained due to anticoagulation and this was negative for hemorrhage.    Plan: Per pulmonology recommendation (Note 06/08/21)  CBC, CMP, ESR, CRP, PT/INR, CF sputum culture, AFB culture, RPP, and CXR.     Will initiate Zosyn and IV tobramycin and page out to admission for management of his CF exacerbation.  Admitted to hospitalist service with plans for pulmonology consult while inpatient.  Has been clinically stable here in the ED.       History   History obtained from: Patient     Christian Young is a 35 y.o. male with history of CF (on trikafta), CF related diabetes, and DVT (on Xarelto) presenting with cough. The patient reports few days of worsening productive cough, shortness of breath, and chest pain resulting in a fall yesterday hitting his head after a fit of cough. No hemoptysis with the cough.  Has not had fever or chills, vomiting or nausea, abdominal pain.  Otherwise, the patient denies LE edema, nausea, vomiting, or diarrhea.  Called his pulmonologist and was referred to the ED for suspected cystic fibrosis exacerbation.  Reports that he had a coughing fit that resulted in a syncopal event. Did strike his head with this but no other injuries.    Past Medical/Past Surgical History:   Reviewed in EHR including nursing documentation as outlined. Pertinent PMH/PSH also noted above in HPI.   Past Medical History:   Diagnosis Date   ??? Anxiety    ??? Chronic pain disorder    ??? Cystic fibrosis (CMS-HCC)    ??? Depression    ??? Hypertension    ??? Lumbar radiculopathy 10/26/2020   ??? Nonproductive cough 04/05/2018     Patient Active Problem List   Diagnosis   ??? Essential hypertension   ??? Depressive disorder   ??? Mood disorder (CMS-HCC)   ??? Anxiety   ??? 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 Chronic pansinusitis   ??? Pancreatic insufficiency due to cystic fibrosis (CMS-HCC)   ??? History of Mycobacterium abscessus infection   ??? Bronchiectasis (CMS-HCC)   ??? Chronic deep vein thrombosis (DVT) of lower extremity (CMS-HCC)   ??? Cystic  fibrosis with pulmonary exacerbation (CMS-HCC)   ??? Cystic fibrosis (CMS-HCC)   ??? Obesity (BMI 30-39.9)   ??? Restless leg syndrome   ??? Degeneration of lumbar intervertebral disc   ??? Lumbar radiculopathy   ??? Idiopathic peripheral neuropathy   ??? Long term current use of anticoagulant therapy   ??? Pain of lumbar spine   ??? Acute non-recurrent pansinusitis     Past Surgical History:   Procedure Laterality Date   ??? IR INSERT PORT AGE GREATER THAN 5 YRS  03/27/2019    IR INSERT PORT AGE GREATER THAN 5 YRS 03/27/2019 Rush Barer, MD IMG [facility]   ??? PR REMOVAL OF LUNG,LOBECTOMY Right 03/29/2017    Procedure: REMOVAL OF LUNG, OTHER THAN PNEUMONECTOMY; SINGLE LOBE (LOBECTOMY);  Surgeon: Cherie Dark, MD;  Location: MAIN OR [facility];  Service: Thoracic Social History/Family History:   Reviewed in EMR, I agree with nursing documentation. Additional pertinent social and family history noted in HPI.   Social History     Tobacco Use   ??? Smoking status: Former Smoker     Types: e-Cigarettes   ??? Smokeless tobacco: Never Used   Substance Use Topics   ??? Alcohol use: Not Currently     Alcohol/week: 3.0 standard drinks     Types: 3 Glasses of wine per week   ??? Drug use: No     Family History   Problem Relation Age of Onset   ??? Bipolar disorder Mother    ??? Depression Mother        ROS  A review of systems reviewed and are negative except as stated in the HPI or noted below.     Constitutional; Neg for fevers   Eyes: Neg for vision changes. Neg for blurry vision.   Ears, nose, mouth, throat: Neg for sore throat  Cardiovascular: Neg for chest pain.   Respiratory: Po for cough. Pos for shortness of breath.   Gastrointestinal: Neg for abdominal pain. Neg for nausea or vomiting.   Genitourinary: Neg for dysuria. Neg for hematuria  Musculoskeletal: Neg for joint pain. Neg for swelling   Skin: Neg for rashes  Neurological: Neg for focal numbness or weakness    Home Medications:  No current facility-administered medications for this encounter.    Current Outpatient Medications:   ???  albuterol (PROVENTIL HFA;VENTOLIN HFA) 90 mcg/actuation inhaler, Inhale 2 puffs every six (6) hours as needed., Disp: 1 Inhaler, Rfl: 1  ???  albuterol 2.5 mg /3 mL (0.083 %) nebulizer solution, Inhale 3 mL (2.5 mg total) by nebulization two (2) times a day., Disp: 540 mL, Rfl: 3  ???  amitriptyline (ELAVIL) 100 MG tablet, TAKE ONE TABLET BY MOUTH AT BEDTIME (Patient taking differently: Take 100 mg by mouth nightly.), Disp: 30 tablet, Rfl: 1  ???  atorvastatin (LIPITOR) 20 MG tablet, Take 20 mg by mouth nightly., Disp: , Rfl:   ???  BETHKIS 300 mg/4 mL Nebu, Inhale 1 vial via nebulization every 12 hours for 28 days, alternating every other 28 days, Disp: 224 mL, Rfl: 6  ???  blood sugar diagnostic (GLUCOSE BLOOD) Strp, by Other route Three (3) times a day with a meal. Rx sent to [facility] 01/04/20, Disp: , Rfl:   ???  blood-glucose meter kit, Dispense meter that is preferred by [employer], Disp: 1 each, Rfl: 0  ???  blood-glucose meter,continuous (DEXCOM G6 RECEIVER) Misc, Use as directed, Disp: 1 each, Rfl: 0  ???  blood-glucose sensor (DEXCOM G6 SENSOR)  Devi, Use sq as directed every 10 days, Disp: 9 each, Rfl: 11  ???  blood-glucose transmitter (DEXCOM G6 TRANSMITTER) Devi, Use as directed, replace every 90 days, Disp: 1 each, Rfl: 11  ???  budesonide-formoteroL (SYMBICORT) 160-4.5 mcg/actuation inhaler, Inhale 2 puffs Two (2) times a day., Disp: 30.6 g, Rfl: 3  ???  cetirizine (ZYRTEC) 10 MG tablet, Take 1 tablet (10 mg total) by mouth daily., Disp: 30 tablet, Rfl: 11  ???  docusate sodium (COLACE) 100 MG capsule, Take 100 mg by mouth in the morning. As needed while on pain meds s/p back surgery., Disp: , Rfl:   ???  dornase alfa (PULMOZYME) 1 mg/mL nebulizer solution, Inhale 2.5 mg daily., Disp: 450 mL, Rfl: 3  ???  EASY TOUCH LANCING DEVICE Misc, Use as directed., Disp: , Rfl:   ???  EASY TOUCH TWIST LANCETS 30 gauge Misc, Use as directed., Disp: , Rfl:   ???  elexacaftor-tezacaftor-ivacaft (TRIKAFTA) 100-50-75 mg(d) /150 mg (n) tablet, Take 2 Tablets (orange) by mouth in the morning and 1 tablet (blue) in the evening with fatty food, Disp: 84 tablet, Rfl: 6  ???  FLUoxetine 60 mg Tab, Take 60 mg by mouth daily. , Disp: , Rfl:   ???  fluticasone propionate (FLONASE) 50 mcg/actuation nasal spray, 1 spray into each nostril daily., Disp: , Rfl:   ???  FREESTYLE LIBRE 14 DAY SENSOR kit, Use 3-4 times daily, Disp: , Rfl:   ???  gabapentin (NEURONTIN) 600 MG tablet, Take 600 mg by mouth Three (3) times a day., Disp: , Rfl:   ???  insulin ASPART (NOVOLOG FLEXPEN) 100 unit/mL (3 mL) injection pen, Use up to 150 units/day, divided 3 TIMES DAILY BEFORE MEALS (Patient taking differently: Inject 30 Units under the skin Three (3) times a day before meals. Plus sliding scale), Disp: 45 mL, Rfl: 0  ???  insulin ASPART (NOVOLOG FLEXPEN) 100 unit/mL (3 mL) injection pen, Use up to 50 units/day, divided TID AC meals as per MD instructions, Disp: 45 mL, Rfl: 12  ???  insulin ASPART (NOVOLOG U-100 INSULIN ASPART) 100 unit/mL injection, Subcutaneously infuse up to 150 units daily via insulin pump, Disp: 150 mL, Rfl: 11  ???  insulin glargine (BASAGLAR, LANTUS) 100 unit/mL (3 mL) injection pen, Inject 0.7 mL (70 Units total) under the skin daily. (Patient taking differently: Inject 50 Units under the skin nightly.), Disp: 30 mL, Rfl: 0  ???  insulin glargine (BASAGLAR, LANTUS) 100 unit/mL (3 mL) injection pen, Use up to 100 units per day as per MD instructions, Disp: 90 mL, Rfl: 12  ???  insulin pump cart,automated,BT (OMNIPOD 5 G6 PODS, GEN 5,) Crtg, 1 each by subcutaneous (via wearable injector) route every three (3) days. Change every 72 hour, Disp: 10 each, Rfl: 12  ???  lamoTRIgine (LAMICTAL) 200 MG tablet, Take 200 mg by mouth Two (2) times a day., Disp: , Rfl:   ???  levoFLOXacin (LEVAQUIN) 750 MG tablet, Take 1 tablet (750 mg total) by mouth daily for 14 days., Disp: 14 tablet, Rfl: 0  ???  lipase-protease-amylase (CREON) 36,000-114,000- 180,000 unit CpDR, Take 8 caps with meals TID and 4 caps with snacks up to three times a day., Disp: 1200 capsule, Rfl: 11  ???  lisinopriL (PRINIVIL,ZESTRIL) 10 MG tablet, Take 1 tablet (10 mg total) by mouth daily., Disp: 30 tablet, Rfl: 0  ???  melatonin 10 mg Tab, Take 10 mg by mouth nightly., Disp: , Rfl:   ???  methocarbamoL (ROBAXIN) 750  MG tablet, Take 500 mg by mouth. Every 6-8 hours as needed, Disp: , Rfl:   ???  montelukast (SINGULAIR) 10 mg tablet, Take 1 tablet (10 mg total) by mouth nightly., Disp: 90 tablet, Rfl: 3  ???  mupirocin (BACTROBAN) 2 % ointment, Apply 1 application topically Three (3) times a day. 7 days, Disp: , Rfl:   ???  MVW COMPLETE FORMUL PROBIOTIC 40 billion cell -15 mg CpDR, Take 1 capsule by mouth daily. (Patient taking differently: Take 1 capsule by mouth daily.), Disp: 90 capsule, Rfl: 3  ???  MVW Complete, pediatric multivit 61-D3-vit K, (MVW COMPLETE FORMULATION) 1,500-800 unit-mcg cap, Take 1 capsule by mouth Two (2) times a day. (Patient taking differently: Take 2 capsules by mouth Two (2) times a day.), Disp: , Rfl:   ???  nebulizers Misc, Use as directed with inhaled medications, Disp: 4 each, Rfl: 3  ???  omeprazole (PRILOSEC) 20 MG capsule, Take 1 capsule (20 mg total) by mouth in the morning., Disp: 90 capsule, Rfl: 3  ???  oxyCODONE (OXY-IR) 5 mg capsule, Take 5 mg by mouth every four (4) hours as needed for pain. 5-10mg  prn pain (s/p back surgery), Disp: , Rfl:   ???  pen needle, diabetic (BD ULTRA-FINE NANO PEN NEEDLE) 32 gauge x 5/32 (4 mm) Ndle, use up to 4 times daily, Disp: 400 each, Rfl: 12  ???  polyethylene glycol (MIRALAX) 17 gram packet, Take 17 g by mouth in the morning. As needed while on pain meds s/p back surgery., Disp: , Rfl:   ???  pramipexole (MIRAPEX) 0.125 MG tablet, Take 0.25 mg by mouth every evening. , Disp: , Rfl:   ???  sodium chloride 7% 7 % Nebu, Inhale 4 mL by nebulization Two (2) times a day. Increase to 4 times while taking antibiotics, Disp: , Rfl:   ???  traZODone (DESYREL) 150 MG tablet, 150 mg nightly., Disp: , Rfl:   ???  XARELTO 20 mg tablet, Take 20 mg by mouth daily., Disp: , Rfl:     ALLERGIES:   Aztreonam, [place] [aztreonam lysine], Cefepime, Other, Slo-bid 100, Tobramycin, and Banana       Physical Exam     ED Triage Vitals   Enc Vitals Group      BP 06/08/21 1527 159/87      Pulse --       SpO2 Pulse 06/08/21 1527 115      Resp 06/08/21 1527 24      Temp 06/08/21 1526 37 ??C (98.6 ??F)      Temp Source 06/08/21 1526 Oral      SpO2 06/08/21 1527 95 %     Vital signs reviewed.  GENERAL: well-appearing in no acute distress,    HEENT: airway is patent, moist mucus membranes, oropharynx clear without lesions, exudates, thrush  EYES: pupils equal and reactive to light bilaterally, no scleral icterus, conjunctivae clear  NECK: trachea midline, no cervical lymphadenopathy  CARDIAC: Tachycardic, regular rhythm, normal S1 and S2, no murmurs, 2+ peripheral pulses, normal capillary refill  PULMONARY: normal work of breathing on RA, good airway movements. Coarse breath sounds in bilateral bases.  ABDOMINAL: normal bowel sounds, abdomen soft, nontender, nondistended, no hepatomegaly or splenomegaly  MSK: no joint deformity or swelling, moves all 4 extremities without difficulty, no lower extremity edema  NEURO: alert and oriented ??3, normal mental status, PERRL, EOMI, 5/5 strength in all 4 extremities, no focal sensory deficits to light touch in distal extremities  SKIN: Warm and dry  without rashes  PSYCH: Mood and affect are normal      Labs and Radiology     Labs Reviewed   COMPREHENSIVE METABOLIC PANEL - Abnormal; Notable for the following components:       Result Value    Sodium 132 (*)     Chloride 96 (*)     Glucose 391 (*)     Total Protein 8.3 (*)     Alkaline Phosphatase 147 (*)     All other components within normal limits   PROTIME-INR - Abnormal; Notable for the following components:    PT 14.7 (*)     All other components within normal limits   CBC W/ AUTO DIFF - Abnormal; Notable for the following components:    WBC 12.0 (*)     RDW 15.3 (*)     Absolute Neutrophils 8.7 (*)     Absolute Monocytes 0.9 (*)     All other components within normal limits   HIGH SENSITIVITY TROPONIN I - SERIAL - Normal   RESPIRATORY PATHOGEN PANEL WITH COVID   LOWER RESPIRATORY CULTURE   AFB CULTURE   CBC W/ DIFFERENTIAL    Narrative:     The following orders were created for panel order CBC w/ Differential.  Procedure                               Abnormality         Status                     ---------                               -----------         ------                     CBC w/ [id number]]         Abnormal            Final result                 Please view results for these tests on the individual orders.   HIGH SENSITIVITY TROPONIN I - 2 HOUR SERIAL   HIGH SENSITIVITY TROPONIN I - 6 HOUR SERIAL     CT head WO contrast   Final Result   No acute or post traumatic intracranial findings.      XR Chest 2 views    (Results Pending)          EKG   Sinus tachycardia 117 bpm.  No PR, QRS QTc interval.  Normal cures axis.  No concerning ST segment elevation or ST depression.  No significant change from prior.      ED Treatment   Medications given in the ED:   Medications   piperacillin-tazobactam (ZOSYN) IVPB (premix) 4.5 g (4.5 g Intravenous New Bag 06/08/21 1645)     Documentation assistance was provided by Debby Freiberg Casimiro Needle) Durenda Age, on June 08, 2021 at 17:16 for Army Melia, MD.    Documentation assistance was provided by the scribe in my presence.  The documentation recorded by the scribe has been reviewed by me and accurately reflects the services I personally performed.        Portions of this record have been created using Scientist, clinical (histocompatibility and immunogenetics). Dictation errors have been sought, but  may not have been identified and corrected.      Bryson Ha, MD  06/09/21 [phone]

## 2021-06-08 NOTE — Unmapped (Signed)
Pt presents for eval of SOB, cough, congestion and syncope yesterday. Pt states he had a coughing episode yesterday and he fell hitting his head on the floor - pt reports being on eliquis for hx of DVTs. Pt also endorses subjective fever.

## 2021-06-08 NOTE — Unmapped (Signed)
PULMONARY TREATMENT NOTE  ??  Christian Young is a 35 year old man with history of CF (T419Q and [phone] insertion) on trikafta, CFRD on insulin, history of DVT (on xarelto currently), with recent baseline lung function of FEV1 ~72% recent increased cough, dyspnea - suggestive of CF exacerbation; levaquin started 05/27/21 but ongoing worsening symptoms prompting eval for admission to treat CF exacerbation  ??  Please see recent phone encounters from myself and [facility] spirometry (which he reports is fairly reliable for him) was reduced to 47%  For airway clearance, does albuterol and HTS 7% twice a day, vest and aerobika when not feeling well, but when well just uses vest; also on daily pulmozyme, symbicort BID    Typically grows pseudomonas OPF, and MSSA, more remotely has grown NTM but not recently (last +AFB was spring 2020, with MAC). Last culture 03/14/21 also grew stenotrophomonas in addition to pseudomonas (was pan-S). Admission in August 2022 with positive COVID.  ??  Recommendations for initial evaluation (in addition to other evaluations deemed appropriate by the examining provider) include:  - initial labs to assess baseline renal function, etc: CMP, CBC diff, ESR, CRP, PT/INR,   - please obtain CF sputum culture and if able to produce, AFB Culture (history of growing NTM, not concerned for tuberculosis)  - please obtain full RPP  - CXR PA/Lateral  ??  Recommendations for initial treatment of CF exacerbation:  - Will initiate antibiotics based on what has worked well for him recently: start Zosyn 4.5 mg q6h and IV tobramycin per pharmacy dosing - during last admission was on IV tobramycin 260 mg BID - has a history of AIN on zosyn in the past so needs close renal monitoring; this regimen would not cover steno, so depending on response and updated culture results on admission may need to adjust therapies  - history of tinnitus with tobramycin in the past,will need audiology outpatient evaluation but had not yet been seen (unclear if this can be done at [facility], if not, will work towards outpatient) - if showing signs of hearing loss may need to consider alternative antibiotic therapies which may be challenging with list of allergies  - obtain PFTs early in exacerbation and at 7-10 day mark to guide duration of therapy  - increase airway clearance: Vest and aerobika at least TID, albuterol at least TID, does increase frequency of HTS when admitted so would also increase to TID; continue pulmozyme daily, and formulary substitution for home symbicort  - patient has been instructed to bring home supply of trikafta for use while in hospital  - if PT/INR normal on admission start 5 mg PO vitamin K twice a week while on IV antibiotics; if PT/INR elevated on admission start 5 mg PO vitamin K daily and recheck in 5 days - if normalized switch to twice a week while on IV antibiotics, if still elevated continue daily while on IV antibiotics (may be influenced by home xarelto use)  - continue home enzymes and other multivitamins   - in addition to RT for airway clearance order, please also consider PT consult during admission for exercise as part of airway clearance  - continue home nasal spray, in process of referral to ENT as outpatient  - glucose monitoring while admitted, last A1c 10, low threshold to engage endocrine for CFRD if sugars difficult to control during acute exacerbation   - ensure regular BMs while admitted with scheduled daily miralax and hold parameters/uptitration as needed - history of  possible SBO/ileus vs DIOS in past  - please consult pulmonary during admission for ongoing evaluation and recommendations during course of his care  ??  ??  Murlean Iba, MD  [facility] Pediatric Pulmonary Fellow  [facility] Adult Pulmonary and Critical Care Fellow  [phone]

## 2021-06-08 NOTE — Unmapped (Signed)
Received a call from [place]. Feels poorly, not improving on his current regimen. Says he passed out yesterday from coughing fit and exhaustion. He is packing up and driving to [facility] today. Will alert Dr [name] and MAOs.     [name], RN  CF Nurse Coordinator   [phone]

## 2021-06-09 LAB — C-REACTIVE PROTEIN: C-REACTIVE PROTEIN: 45 mg/L — ABNORMAL HIGH (ref <=10.0)

## 2021-06-09 MED ADMIN — elexacaftor-tezacaftor-ivacaft (TRIKAFTA) tablet 2 ORANGE tablets    **PATIENT SUPPLIED MEDICATION**: 2 | ORAL | @ 15:00:00

## 2021-06-09 MED ADMIN — pediatric multivitamin-vit D3 1,500 unit-vit K 800 mcg (MVW COMPLETE FORMULATION) capsule: 1 | ORAL | @ 04:00:00

## 2021-06-09 MED ADMIN — sodium chloride 7% nebulizer solution 4 mL: 4 mL | RESPIRATORY_TRACT | @ 17:00:00

## 2021-06-09 MED ADMIN — pancrelipase (Lip-Prot-Amyl) (CREON) 24,000-76,000 -120,000 unit delayed release capsule 288,000 units of lipase: 288000 [IU] | ORAL | @ 17:00:00

## 2021-06-09 MED ADMIN — insulin glargine (LANTUS) injection 40 Units: 40 [IU] | SUBCUTANEOUS | @ 04:00:00

## 2021-06-09 MED ADMIN — montelukast (SINGULAIR) tablet 10 mg: 10 mg | ORAL | @ 02:00:00

## 2021-06-09 MED ADMIN — sodium chloride 7% nebulizer solution 4 mL: 4 mL | RESPIRATORY_TRACT

## 2021-06-09 MED ADMIN — piperacillin-tazobactam (ZOSYN) IVPB (premix) 4.5 g: 4.5 g | INTRAVENOUS | @ 16:00:00 | Stop: 2021-06-22

## 2021-06-09 MED ADMIN — cetirizine (ZyrTEC) tablet 10 mg: 10 mg | ORAL | @ 14:00:00

## 2021-06-09 MED ADMIN — amitriptyline (ELAVIL) tablet 100 mg: 100 mg | ORAL | @ 04:00:00

## 2021-06-09 MED ADMIN — albuterol 2.5 mg /3 mL (0.083 %) nebulizer solution 2.5 mg: 2.5 mg | RESPIRATORY_TRACT | @ 17:00:00

## 2021-06-09 MED ADMIN — lamoTRIgine (LaMICtal) tablet 200 mg: 200 mg | ORAL | @ 04:00:00

## 2021-06-09 MED ADMIN — oxyCODONE (ROXICODONE) immediate release tablet 10 mg: 10 mg | ORAL | @ 02:00:00 | Stop: 2021-06-22

## 2021-06-09 MED ADMIN — insulin lispro (HumaLOG) injection 30 Units: 30 [IU] | SUBCUTANEOUS | @ 14:00:00

## 2021-06-09 MED ADMIN — piperacillin-tazobactam (ZOSYN) IVPB (premix) 4.5 g: 4.5 g | INTRAVENOUS | @ 11:00:00 | Stop: 2021-06-22

## 2021-06-09 MED ADMIN — insulin lispro (HumaLOG) injection 30 Units: 30 [IU] | SUBCUTANEOUS | @ 18:00:00

## 2021-06-09 MED ADMIN — elexacaftor-tezacaftor-ivacaft (TRIKAFTA) tablet 1 BLUE tablet  **PATIENT SUPPLIED MEDICATION**: 1 | ORAL | @ 22:00:00

## 2021-06-09 MED ADMIN — tobramycin (NEBCIN) 260 mg in sodium chloride (NS) 0.9 % 100 mL IVPB: 260 mg | INTRAVENOUS | @ 02:00:00 | Stop: 2021-07-08

## 2021-06-09 MED ADMIN — oxyCODONE (ROXICODONE) immediate release tablet 10 mg: 10 mg | ORAL | @ 06:00:00 | Stop: 2021-06-22

## 2021-06-09 MED ADMIN — acetaminophen (TYLENOL) tablet 650 mg: 650 mg | ORAL | @ 15:00:00

## 2021-06-09 MED ADMIN — FLUoxetine (PROzac) capsule 60 mg: 60 mg | ORAL | @ 14:00:00

## 2021-06-09 MED ADMIN — lamoTRIgine (LaMICtal) tablet 200 mg: 200 mg | ORAL | @ 14:00:00

## 2021-06-09 MED ADMIN — sodium chloride 7% nebulizer solution 4 mL: 4 mL | RESPIRATORY_TRACT | @ 13:00:00

## 2021-06-09 MED ADMIN — fluticasone propionate (FLONASE) 50 mcg/actuation nasal spray 1 spray: 1 | NASAL | @ 14:00:00

## 2021-06-09 MED ADMIN — phytonadione (vitamin K1) (MEPHYTON) tablet 5 mg: 5 mg | ORAL | @ 14:00:00

## 2021-06-09 MED ADMIN — rivaroxaban (XARELTO) tablet 20 mg: 20 mg | ORAL | @ 14:00:00

## 2021-06-09 MED ADMIN — oxyCODONE (ROXICODONE) immediate release tablet 10 mg: 10 mg | ORAL | @ 15:00:00 | Stop: 2021-06-22

## 2021-06-09 MED ADMIN — dornase alfa (PULMOZYME) 1 mg/mL solution 2.5 mg: 2.5 mg | RESPIRATORY_TRACT | @ 13:00:00

## 2021-06-09 MED ADMIN — insulin lispro (HumaLOG) injection 0-20 Units: 0-20 [IU] | SUBCUTANEOUS | @ 02:00:00

## 2021-06-09 MED ADMIN — insulin lispro (HumaLOG) injection 30 Units: 30 [IU] | SUBCUTANEOUS | @ 22:00:00

## 2021-06-09 MED ADMIN — insulin lispro (HumaLOG) injection 0-20 Units: 0-20 [IU] | SUBCUTANEOUS | @ 14:00:00

## 2021-06-09 MED ADMIN — piperacillin-tazobactam (ZOSYN) IVPB (premix) 4.5 g: 4.5 g | INTRAVENOUS | @ 22:00:00 | Stop: 2021-06-22

## 2021-06-09 MED ADMIN — piperacillin-tazobactam (ZOSYN) IVPB (premix) 4.5 g: 4.5 g | INTRAVENOUS | @ 04:00:00 | Stop: 2021-06-22

## 2021-06-09 MED ADMIN — atorvastatin (LIPITOR) tablet 20 mg: 20 mg | ORAL | @ 04:00:00

## 2021-06-09 MED ADMIN — pediatric multivitamin-vit D3 1,500 unit-vit K 800 mcg (MVW COMPLETE FORMULATION) capsule: 1 | ORAL | @ 14:00:00

## 2021-06-09 MED ADMIN — pantoprazole (PROTONIX) EC tablet 20 mg: 20 mg | ORAL | @ 14:00:00

## 2021-06-09 MED ADMIN — pancrelipase (Lip-Prot-Amyl) (CREON) 24,000-76,000 -120,000 unit delayed release capsule 288,000 units of lipase: 288000 [IU] | ORAL | @ 22:00:00

## 2021-06-09 MED ADMIN — tobramycin (NEBCIN) 260 mg in sodium chloride (NS) 0.9 % 100 mL IVPB: 260 mg | INTRAVENOUS | @ 18:00:00 | Stop: 2021-06-09

## 2021-06-09 MED ADMIN — fluticasone furoate-vilanteroL (BREO ELLIPTA) 200-25 mcg/dose inhaler 1 puff: 1 | RESPIRATORY_TRACT | @ 13:00:00

## 2021-06-09 MED ADMIN — gabapentin (NEURONTIN) capsule 600 mg: 600 mg | ORAL | @ 14:00:00

## 2021-06-09 MED ADMIN — pancrelipase (Lip-Prot-Amyl) (CREON) 24,000-76,000 -120,000 unit delayed release capsule 288,000 units of lipase: 288000 [IU] | ORAL | @ 14:00:00

## 2021-06-09 MED ADMIN — albuterol 2.5 mg /3 mL (0.083 %) nebulizer solution 2.5 mg: 2.5 mg | RESPIRATORY_TRACT | @ 13:00:00

## 2021-06-09 MED ADMIN — albuterol 2.5 mg /3 mL (0.083 %) nebulizer solution 2.5 mg: 2.5 mg | RESPIRATORY_TRACT

## 2021-06-09 MED ADMIN — pramipexole (MIRAPEX) tablet 0.25 mg: .25 mg | ORAL | @ 22:00:00

## 2021-06-09 MED ADMIN — traZODone (DESYREL) tablet 150 mg: 150 mg | ORAL | @ 04:00:00

## 2021-06-09 MED ADMIN — lisinopriL (PRINIVIL,ZESTRIL) tablet 10 mg: 10 mg | ORAL | @ 14:00:00

## 2021-06-09 MED ADMIN — gabapentin (NEURONTIN) capsule 600 mg: 600 mg | ORAL | @ 04:00:00

## 2021-06-09 MED ADMIN — melatonin tablet 10.5 mg: 10 mg | ORAL | @ 04:00:00

## 2021-06-09 MED ADMIN — insulin lispro (HumaLOG) injection 0-20 Units: 0-20 [IU] | SUBCUTANEOUS | @ 18:00:00

## 2021-06-09 MED ADMIN — gabapentin (NEURONTIN) capsule 600 mg: 600 mg | ORAL | @ 18:00:00

## 2021-06-09 NOTE — Unmapped (Signed)
Aminoglycoside Therapeutic Monitoring Pharmacy Note    Christian Young is a 35 y.o. male starting tobramycin. Date of therapy initiation: 05/14/21    Indication: CF exacerbation    Prior Dosing Information: Previous regimen 260 mg IV every 12 hhours (August 2022); proposed Peak 10, trough 0.1     Goals:  Therapeutic Drug Levels  ?? Trough level: tobramycin <1 mg/L  ?? Peak level: tobramycin 10-14 mg/L    Additional Clinical Monitoring/Outcomes  Renal function, volume status (intake and output)    Results:   ?? Not applicable   ?? Not applicable    Wt Readings from Last 1 Encounters:   03/14/21 (!) 111.2 kg (245 lb 1.6 oz)     Lab Results   Component Value Date    CREATININE 0.79 06/08/2021       Pharmacokinetic Considerations and Significant Drug Interactions:  ??? Adult (estimated initial): Vd = 30.3 L, ke = 0.34 hr-1  ??? Concurrent nephrotoxic meds: Not applicable    Assessment/Plan:  Recommendation(s)  ??? Start 260 mg IV every 12 hours   ??? Estimated peak and trough on recommended regimen: peak = 8 mg/L, trough = 0.2 mg/L   ??? Resuming most recent dosing regimen. Appears from last admission patient placed on 260 mg IV every 12 hours and dose was lower than goal range but kept due to variable levels on constant regimen. Given risk/benefit will resume at previous dose and titrate up if needed.     Follow-up  ??? Level due: peak and trough levels around the fourth or fifth dose  ??? A pharmacist will continue to monitor and order levels as appropriate    Please page service pharmacist with questions/clarifications.    Winferd Humphrey, PharmD

## 2021-06-09 NOTE — Unmapped (Signed)
Cystic Fibrosis Nutrition Assessment    Inpatient, Telehealth via phone: Nutrition Risk Screening this admission for CF and related follow up  Primary Pulmonary Provider: Dr [name]  ===================================================================  Christian Young is a 35 y.o. male w/ PMH significant for cystic fibrosis, pancreatic insufficiency, diabetes who presented on 10/27 with CF exacerbation. Christian Young was taking a nap at time of call; requested return call next week. Denies any urgent nutrition needs.  ===================================================================  INTERVENTION:    1. Continue meal dose of enzymes. Increase PRN dose to Creon 24,000 x 6 caps  - pt takes Creon 36,000 at home which is not available on inpatient formulary    2. Defer to team re: Vit K while on IV antibiotics given anticoagulation with Xarelto    3. Recommend acid reducer    4. Continue remainder of nutrition regimen:  - High Calorie Diet  - bowel regimen as ordered  - insulin regimen per endocrinology  - MVW gel cap BID    Inpatient:   Will follow up with patient per protocol: 1-2 times per week (and more frequent as indicated)  Monitor for potential discharge needs with multi-disciplinary team.   ===================================================================  ASSESSMENT:  Nutrition Category = Adult Obesity Class 1: BMI 30 to < 35 kg/m2    Estimated daily needs: 3284 kcal/day, 136-181 g PRO/day, 3368 mL fluid/day      Calories estimated using: Cystic Fibrosis Conference Formula, protein per DRI x 1.5-2, fluid per [facility]    Current diet is appropriate for CF. Current PO intake is adequate to meet estimated CF needs. Patient continues to work towards goals for weight management.   Enzyme dose is within established guidelines. Patient would benefit from adjustment in enzyme regimen. Vitamin prescription is appropriate to reach/maintain optimal fat soluble vitamin levels. Patint to benefit from adjustment/inititiation of acid reducer for GERD and enzyme activation. Patient on CFTR modulator is consuming adequate amounts of fat-containing foods with prescribed medication to optimize absorption.    ASPEN/AND Malnutrition Screening:  Patient does not meet ASPEN/AND criteria for malnutrition at this time.    Goals:  1. Ongoing:  Meet estimated daily needs  2. Ongoing:  Reach/maintain established anthropometric goals for Adult CF: BMI < 30 kg/m2   3. Ongoing:  Normal fat-soluble vitamin levels: Vitamin A, Vitamin E and PT per lab range; Vitamin D 25OH total >30   4. Ongoing:  Maintain glucose control. Carbohydrate content of diet should comprise 40-50% of total calorie needs, but carbohydrates are not restricted in this population.    5. Ongoing:  Meet sodium needs for CF     Nutrition goals reviewed, and relevant barriers identified and addressed: none evident.   Patient is evaluated to have good  willingness and ability to achieve nutrition goals.   ===================================================================  INPATIENT:  Christian Young is admitted with Shortness of breath [R06.02]  Productive cough [R05.8]  Chest pain, unspecified type [R07.9].    Current Nutrition Orders (inpatient):  Oral intake        Nutrition Orders   (From admission, onward)             Start     Ordered    06/08/21 2055  Nutrition Therapy High Calorie High Protein  Effective now        Question:  Nutrition Therapy:  Answer:  High Calorie High Protein    06/08/21 2054              CF Nutrition related medications (inpatient): Nutritionally  relevant medications reviewed.   Lipitor  trikafta  Insulin per endo  Creon 24,000 x 12 caps/meal, 5 caps/snack  protonix  MVW complete formulation gel cap BID  Vit K 5mg  2x weekly  miralax once daily  PRN senna  PRN zofran    CF Nutrition related labs (inpatient): reviewed; PT elevated  ==================================================================  CLINICAL DATA:  Past Medical History:   Diagnosis Date   ??? Anxiety    ??? Chronic pain disorder    ??? Cystic fibrosis (CMS-HCC)    ??? Depression    ??? Hypertension    ??? Lumbar radiculopathy 10/26/2020   ??? Nonproductive cough 04/05/2018     Patient Active Problem List   Diagnosis   ??? Essential hypertension   ??? Depressive disorder   ??? Mood disorder (CMS-HCC)   ??? Anxiety   ??? 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 Chronic pansinusitis   ??? Pancreatic insufficiency due to cystic fibrosis (CMS-HCC)   ??? History of Mycobacterium abscessus infection   ??? Bronchiectasis (CMS-HCC)   ??? Chronic deep vein thrombosis (DVT) of lower extremity (CMS-HCC)   ??? Cystic fibrosis with pulmonary exacerbation (CMS-HCC)   ??? Cystic fibrosis (CMS-HCC)   ??? Obesity (BMI 30-39.9)   ??? Restless leg syndrome   ??? Degeneration of lumbar intervertebral disc   ??? Lumbar radiculopathy   ??? Idiopathic peripheral neuropathy   ??? Long term current use of anticoagulant therapy   ??? Pain of lumbar spine   ??? Acute non-recurrent pansinusitis     Anthroprometric Evaluation:  Weight changes: weight uptrending over past ~3 months  CFTR modulator and weight change: On Trikafta  BMI Readings from Last 3 Encounters:   06/08/21 33.91 kg/m??   03/14/21 33.20 kg/m??   03/09/21 32.53 kg/m??     Wt Readings from Last 3 Encounters:   06/08/21 (!) 113.4 kg (250 lb)   03/14/21 (!) 111.2 kg (245 lb 1.6 oz)   03/09/21 (!) 109 kg (240 lb 3.2 oz)     Ht Readings from Last 3 Encounters:   06/08/21 182.9 cm (6')   03/14/21 183 cm (6' 0.05)   03/09/21 183 cm (6' 0.05)     ==================================================================  Energy Intake (outpatient):  Diet: Unable to obtain detailed nutrition history; Christian Young was napping at time of call.    Allergies, Intolerances, Sensitivities, and/or Cultural/Religious Dietary Restrictions:  include banana  Allergies   Allergen Reactions   ??? Aztreonam Anaphylaxis, Hives, Nausea And Vomiting and Rash     fevers  Patient stated that he only vomited x1 with [place] in the past.????    fevers  Patient stated that he only vomited x1 with [place] in the past.????   ??? [place] [Aztreonam Lysine] Anaphylaxis   ??? Cefepime Itching, Nausea Only and Other (See Comments)     Headaches also   ??? Other Anaphylaxis and Other (See Comments)     Other reaction(s): Other (See Comments)  Bananas: itchy throat  Slo Bid record from [employer] states anaphylaxis.????Pt states this was from childhood and does not know reaction.  Bananas, causes itchy throat   ??? Slo-Bid 100 Anaphylaxis   ??? Tobramycin Tinnitus     From OSH record-documented as tinnitus but has received IV tobra with close monitoring.  Pt has received Tobramycin at [facility] since this allergy documented    From OSH record-documented as tinnitus but has received IV tobra with close monitoring.   ??? Banana Itching and Nausea And Vomiting   Sodium in  diet: historically adequate  Calcium in diet:  historically adequate  Food Insecurity:   Food Insecurity: No Food Insecurity   ??? Worried About Programme researcher, broadcasting/film/video in the Last Year: Never true   ??? Ran Out of Food in the Last Year: Never true   CFTR modulator and Diet: Prescribed Trikafta (elexacaftor/tezacaftor/ivacaftor).  PO Supplements: none  Patient resources for DME/formula:  -none  Appetite Stimulant: none  Enteral feeding tube: none  Physical activity: limited recently d/t back pain; not discussed today    GI/Malabsorption (outpatient):  Enzyme brand, (meals/snacks):  Creon 36,000 @ 8/meal and 4/snack  Enzyme administration details: correct pre-meal administration., good compliance at all meals and snacks  Enzyme dose per MEAL (units lipase/kg/meal) 2539  Enzyme dose per DAY (units lipase/kg/day) 81191  GI meds:  Nutritionally relevant medications reviewed. Miralax, colace, probiotic, prilosec  Stools: no documented BMs this admission  Fecal Fat Studies: insufficient  No results found for: [id number]  Lab Results   Component Value Date    ELAST <15 (L) 03/19/2017     No results found for: PELAI    Vitamins/Minerals (outpatient):  CF-specific MVI, dose, compliance: MVW Complete Formulation Softgel regular 2 daily, good compliance  Other vitamins/minerals/herbals: none  Patient Resources for vitamins: [employer]  phone [phone]. Active through September 2023.  Calcium supplement: none  Fat-soluble vitamin levels: within normal limits June 2022 with exception of PT. PT historically elevated with normal DCP. Would likely benefit from recheck of DCP.  Lab Results   Component Value Date    VITAMINA 58.7 02/06/2021    VITAMINA 42.4 01/18/2020    VITAMINA 44.4 11/19/2016     Lab Results   Component Value Date    CRP <4.0 03/14/2021    CRP <4.0 11/24/2020    CRP <4.0 06/28/2020     Lab Results   Component Value Date    VITDTOTAL 38.2 02/06/2021    VITDTOTAL 27.9 03/19/2017     Lab Results   Component Value Date    VITAME 16.0 02/06/2021     Lab Results   Component Value Date    PT 14.7 (H) 06/08/2021    PT 15.4 (H) 03/20/2021    PT 14.0 (H) 03/17/2021     Lab Results   Component Value Date    DESGCARBPT 0.2 03/20/2017     No results found for: PIVKAII    Bone Health: Normal vitamin D level. Adequate calcium intake from diet. Normal bone density on DEXA 02/2017.    CF Related Diabetes: yes; on insulin. Followed by endocrinology.  Lab Results   Component Value Date    GLUF 178 (H) 01/18/2020     No results found for: GLUCOSE2HR  Lab Results   Component Value Date    A1C 9.0 (H) 06/08/2021    A1C 10.0 (H) 03/09/2021    A1C 10.7 (H) 11/29/2020       Jackqulyn Livings MPH, RD, LDN  Pager: [phone]

## 2021-06-09 NOTE — Unmapped (Signed)
Adult Inpatient CF Initial Assessment        [name] is a 35 y.o. male who was admitted for Shortness of breath [R06.02]  Productive cough [R05.8]  Chest pain, unspecified type [R07.9].    Heart Rate: 107    Respiratory Rate: 18  Bilateral Breath Sounds: diminished  Cough/Sputum: How Much? large                              Color:green    Are they coughing up blood? Yes:                                                       No: x                                                     How Much?    Home medication & frequency :    Albuterol Neb 2.5mg  BID  Albuterol HFA 14mcg/2puffs PRN  Hypertonic Saline 7% BID  Pulmozyme 2.5mg  Qday      Home airway clearance & frequency:    Vest       Times Chosen for Inhaled Medications and Airway Clearance:  0800, 1400    Methods tried & found to be ineffective:   METANBE    Home O2? Yes:                       No:X  If yes, what is their home requirement:     Home non-invasive ventilation? Yes:                                                          No:X  If yes, enter type of device, settings, & mode:    Airway clearance technique method offered & decided upon:  AEROBIKA AND CHEST VEST     Comments:

## 2021-06-09 NOTE — Unmapped (Signed)
Problem: Infection  Goal: Absence of Infection Signs and Symptoms  Outcome: Ongoing - Unchanged     Problem: Adjustment to Illness (Cystic Fibrosis)  Goal: Optimal Coping  Outcome: Ongoing - Unchanged     Problem: Infection (Cystic Fibrosis)  Goal: Absence of Infection Signs and Symptoms  Outcome: Ongoing - Unchanged     Problem: Oral Intake Inadequate (Cystic Fibrosis)  Goal: Optimal Nutrition Intake  Outcome: Ongoing - Unchanged     Problem: Malabsorption (Cystic Fibrosis)  Goal: Optimal Bowel Elimination  Outcome: Ongoing - Unchanged     Problem: Respiratory Compromise (Cystic Fibrosis)  Goal: Effective Oxygenation and Ventilation  Outcome: Ongoing - Unchanged      Patient refused neb tx & airway clearance therapy @ 23:20 06/08/21 for sleep. Paitent requested neb tx & airway clearance therapy begin on 06/09/21 a.m.  No respiratory distress noted.

## 2021-06-09 NOTE — Unmapped (Signed)
PULMONARY CONSULT  NOTE      Patient: Christian Young(06-Mar-1986)  Reason for consultation: Christian Young is a 35 y.o. male who is seen in consultation at the request of Christian Koyanagi, MD for comprehensive evaluation of Cystic Fibrosis with pulmonary exacerbation.    Assessment and Recommendations:      Principal Problem:    Cystic fibrosis with pulmonary exacerbation (CMS-HCC)  Active Problems:    Essential hypertension    Depressive disorder    Mood disorder (CMS-HCC)    Anxiety    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)    Pancreatic insufficiency due to cystic fibrosis (CMS-HCC)    History of Mycobacterium abscessus infection    Chronic deep vein thrombosis (DVT) of lower extremity (CMS-HCC)    Obesity (BMI 30-39.9)    Restless leg syndrome    Long term current use of anticoagulant therapy  Resolved Problems:    * No resolved hospital problems. *    Unclear trigger of his current exacerbation.  FEV1 is down to 62% from most recently nearing 72%.  Overall he feels quite fatigued and is having ongoing sputum production but denies hemoptysis.    Recommendations:  Continue Zosyn 4.5 mg q6h and IV tobramycin per pharmacy dosing - during last admission was on IV tobramycin 260 mg BID (has a history of AIN on zosyn in the past so needs close renal monitoring). Notably this regimen would not cover stenotrophamonas, so depending on response and updated culture results from admission may need to adjust therapies  - PFTs today with decline in FEV1 to 62%; Young for repeat PFTs at 7-10 day mark to guide duration of therapy  - Continue airway clearance: Vest and aerobika at least TID, albuterol at least TID, HTS 7% TID; continue pulmozyme daily, and formulary substitution for home symbicort  - Continue home Trikafta  - 5 mg PO vitamin K twice a week while on IV antibiotics  - continue home enzymes and other multivitamins   - PT consult for exercise as part of airway clearance  - continue home nasal spray  - Glucose monitoring while admitted, last A1c 10, low threshold to engage endocrine for CFRD if sugars difficult to control during acute exacerbation; agree with current insulin regimen  - ensure regular BMs while admitted with scheduled daily miralax and hold parameters/uptitration as needed as has history of possible SBO/ileus vs DIOS in past      We appreciate the opportunity to assist in the care of this patient.  Please page [phone] with any questions.    Christian Plan, MD    Subjective:      History of Present Illness:  Christian Young is a 35 y.o. male admitted for Cystic Fibrosis with pulmonary exacerbation.    He has been feeling progressively poorly at home over the past 2 weeks, and particularly since this weekend despite levofloxacin and airway clearance. He reports worsening cough, profound fatigue, chest and rib pain from coughing, poor appetite. Some subjective fever but has been uable to measure. Increased sputum production. He reports that the day befoire coming to the hospital he passed out after a coughing fit and woke up on the floor. Event was not witnessed but he thinks he hit his head due to waking up with a headache. He reports that this feels typical for CF exacerbations except somewhat worse. Baseline FEV1 ~72% and recent home spirometry showed FEV1 47%. He spoke to his pulmonologist who recommended admission for IV antibiotics.  He reports adherence to his airway clearance as well as Trikafta.  He is unclear of the trigger to this current exacerbation.  He states he feels much more fatigued than his typical exacerbations.  Chest x-ray at admission did not appear significantly changed from priors on my review.    Review of Systems: A comprehensive review of systems was performed and was negative except as above in HPI  Past Medical History:   Diagnosis Date   ??? Anxiety    ??? Chronic pain disorder    ??? Cystic fibrosis (CMS-HCC)    ??? Depression    ??? Hypertension    ??? Lumbar radiculopathy 10/26/2020   ??? Nonproductive cough 04/05/2018     Past Surgical History:   Procedure Laterality Date   ??? IR INSERT PORT AGE GREATER THAN 5 YRS  03/27/2019    IR INSERT PORT AGE GREATER THAN 5 YRS 03/27/2019 Rush Barer, MD IMG [facility]IR [facility]   ??? PR REMOVAL OF LUNG,LOBECTOMY Right 03/29/2017    Procedure: REMOVAL OF LUNG, OTHER THAN PNEUMONECTOMY; SINGLE LOBE (LOBECTOMY);  Surgeon: Cherie Dark, MD;  Location: MA[facility];  Service: Thoracic     Medications reviewed in Epic  Allergies as of 06/08/2021 - Reviewed 06/08/2021   Allergen Reaction Noted   ??? Aztreonam Anaphylaxis, Hives, Nausea And Vomiting, and Rash 09/06/2015   ??? [place] [aztreonam lysine] Anaphylaxis 12/27/2016   ??? Cefepime Itching, Nausea Only, and Other (See Comments) 09/06/2015   ??? Other Anaphylaxis and Other (See Comments) 09/06/2015   ??? Slo-bid 100 Anaphylaxis 06/20/2017   ??? Tobramycin Tinnitus 09/06/2015   ??? Banana Itching and Nausea And Vomiting 07/29/2016     Family History   Problem Relation Age of Onset   ??? Bipolar disorder Mother    ??? Depression Mother      Social History     Tobacco Use   ??? Smoking status: Former Smoker     Types: e-Cigarettes   ??? Smokeless tobacco: Never Used   Substance Use Topics   ??? Alcohol use: Not Currently     Alcohol/week: 3.0 standard drinks     Types: 3 Glasses of wine per week        Objective:      Physical Exam:  Vitals:    06/08/21 1757 06/08/21 2000 06/08/21 2035 06/09/21 0918   BP: 126/84 141/89 135/90 133/79   Pulse: 93 99 112 108   Resp: 20 (!) 32 20 20   Temp:   36.3 ??C (97.3 ??F) 36.3 ??C (97.3 ??F)   TempSrc:   Oral Axillary   SpO2: 95% 93% 95% 99%   Weight:   (!) 113.4 kg (250 lb)    Height:   182.9 cm (6')      General: No acute distress, appears comfortable   Eyes: Anicteric sclera, conjunctiva clear.  ENT:  Dry mucous membranes.  Lungs: Normal excursion. Good air movement bilaterally. No wheezes or crackles.  Cardiovascular: Regular rate and rhythm.  Abdomen: Soft, non-tender, not distended  Musculoskeletal: Mild clubbing of fingers  Skin: No rashes or lesions.  Neuro: No focal neurological deficits.    Malnutrition Assessment by RD:          Diagnostic Review:   All labs and images were personally reviewed.

## 2021-06-09 NOTE — Unmapped (Signed)
PHYSICAL THERAPY  Evaluation (06/09/21 1301)          Patient Name:?? Christian Young????????   Medical Record Number: [id number]   Date of Birth: 1986/07/02  Sex: Male??  ??    Treatment Diagnosis: order is for exercise program for CF exacerbation to assist with airway clearance     Activity Tolerance: Other (education)     ASSESSMENT         Assessment : Christian Young is a 35 y.o. man with cystic fibrosis presenting with CF pulmonary exacerbation. pt was educated on use of [place] bike, interval training, monitoring s/s, goals for 20-30 minutes of exercise. pt with no further acute PT needs.    Based on the AM-PAC 5 item raw score of 20/20, the patient is considered to be 0% impaired with basic mobility. This indicates no f/u needs. After review of contributing co-morbitities and personal factors, clinical presentation and exam findings, patient demonstrates low complexity for evaluation and development of POC.      Today's Interventions: pt ed re PT POC, use of bike, goals, monitor s/s, interval training, eval AM-PAC 20/20                            PLAN  Planned Frequency of Treatment:?? D/C Services for: D/C Services            Post-Discharge Physical Therapy Recommendations:?? PT services not indicated     PT DME Recommendations: None??????????       Goals:   Patient and Family Goals: not specifically stated                 ??????????????????????       ??????????????????????       ??????????????????????       ??????????????????????       ??????????????????????                 SUBJECTIVE  Patient reports: I would like to try to exercise.  Current Functional Status: pt received and session concluded with pt in the recliner, RN aware  Services patient receives: PT  Prior Functional Status: Ambulatory without AD         Past Medical History:   Diagnosis Date   ??? Anxiety    ??? Chronic pain disorder    ??? Cystic fibrosis (CMS-HCC)    ??? Depression    ??? Hypertension    ??? Lumbar radiculopathy 10/26/2020   ??? Nonproductive cough 04/05/2018            Social History Tobacco Use   ??? Smoking status: Former Smoker     Types: e-Cigarettes   ??? Smokeless tobacco: Never Used   Substance Use Topics   ??? Alcohol use: Not Currently     Alcohol/week: 3.0 standard drinks     Types: 3 Glasses of wine per week       Past Surgical History:   Procedure Laterality Date   ??? IR INSERT PORT AGE GREATER THAN 5 YRS  03/27/2019    IR INSERT PORT AGE GREATER THAN 5 YRS 03/27/2019 Rush Barer, MD IMG [facility]IR [facility]   ??? PR REMOVAL OF LUNG,LOBECTOMY Right 03/29/2017    Procedure: REMOVAL OF LUNG, OTHER THAN PNEUMONECTOMY; SINGLE LOBE (LOBECTOMY);  Surgeon: Cherie Dark, MD;  Location: MAIN [facility];  Service: Thoracic             Family History   Problem Relation Age of Onset   ???  Bipolar disorder Mother    ??? Depression Mother         Allergies: Aztreonam, [place] [aztreonam lysine], Cefepime, Other, Slo-bid 100, Tobramycin, and Banana                  Objective Findings  Precautions / Restrictions  Precautions: Isolation precautions  Weight Bearing Status: Non-applicable  Required Braces or Orthoses: Non-applicable     Communication Preference: Verbal          Pain Comments: pt reports some chronic low back pain  Medical Tests / Procedures: reviewed in EPIC  Equipment / Environment: Vascular access (PIV, TLC, Port-a-cath, PICC), Patient not wearing mask for full session, Caregiver wearing mask for full session     At Rest: VSS per EPIC  With Activity: NAD  Orthostatics: asymptomatic        Living Situation  Living Environment: House  Lives With: Spouse  Home Living: One level home, Level entry, Tub/shower unit, Standard height toilet      Cognition comment: Responds to all questions and commands appropriately  Visual/Perception: Within Functional Limits     Skin Inspection: Intact where visualized     Upper Extremities  UE Strength: Right WFL, Left WFL    Lower Extremities  LE Strength: Right WFL, Left WFL     Coordination: WFL  Sensation: WFL  Balance: WFL      Bed Mobility: not addressed,pt OOB     Transfers: Sit to Stand  Sit to Stand assistance level: Independent      Gait Level of Assistance: Independent  Gait: In his room                        Physical Therapy Session Duration  PT Individual [mins]: 15     Medical Staff Made Aware: RN Morrie Sheldon     I attest that I have reviewed the above information.  SignedDelbert Phenix, PT  Filed 06/09/2021

## 2021-06-09 NOTE — Unmapped (Signed)
[facility] Medicine   History and Physical    Assessment/Plan:    Principal Problem:    Cystic fibrosis with pulmonary exacerbation (CMS-HCC)  Active Problems:    Essential hypertension    Depressive disorder    Anxiety    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)    Pancreatic insufficiency due to cystic fibrosis (CMS-HCC)    History of Mycobacterium abscessus infection    Chronic deep vein thrombosis (DVT) of lower extremity (CMS-HCC)    Obesity (BMI 30-39.9)    Restless leg syndrome    Long term current use of anticoagulant therapy      Christian Young is a 35 y.o. man with cystic fibrosis presenting with CF pulmonary exacerbation.    Cystic fibrosis pulmonary exacerbation   Feeling increasingly poorly over the past 2 weeks despite oral levofloxacin and airway clearance. FEV1 reduced to 47% from baseline ~72% on home PFTs. CXR shows small right pleural effusion with adjacent opacity. Will discuss this with the pulmonologist in the AM but I suspect this represents mucus plugging. Typically grows pseudomonas OPF, and MSSA, more remotely has grown NTM but not recently (last +AFB was spring 2020, with MAC). Last culture 03/14/21 also grew stenotrophomonas in addition to pseudomonas (was pan-S). Admission in August 2022 with positive COVID.  - Primary pulmonologist: Murlean Iba MD  - Consult pulmonary in the AM  - Start IV tobramycin & zosyn per pulmonary recs -- monitor for hearing loss & AKI/AIN (He has a h/o both)  - f/u CF sputum cx & AFB cx (looking for non-TB mycobacterium)  - continue symbicort BID  - continue Trikafta (patient to supply home med)  - PFTs now (Tues) and again in 1 week  - Airway clearance: Aerobika & Chest vest; Albuterol & 7% HTS nebs  - Oxycodone PRN for chest/rib pain     Pancreatic insufficiency related to CF  - Continue home Creon  - f/u PT-INR (Scheduled for Monday 10/31)-- > If elevated, increase vitamin K from twice weekly to daily    Diabetes mellitus related to CF: Most recent A1C 10.7% in April of 2022.  - f/u HbA1C  - Patient reports he uses omnipiod with CGM at home  - Will order per his backup regimen: Lantus 40 nightly, short acting 30 units AC and sliding scale      H/o SBO/Ileus vs DIOS  - Start miralax daily  - Additional senna & miralax prn constipation    Chronic DVT  - Continue home Xarelto    HTN  - Home lisinopril     Mood Disorder:   - Continue home fluoxetine, trazodone, lamotrigine, and amitriptyline   - EKG for baseline QTC monitoring    Advanced care planning.  - Code status: Full code, presumed     ___________________________________________________________________    Chief Complaint  Chief Complaint   Patient presents with   ??? Shortness of Breath       HPI:  Christian Young is a 35 y.o. male with PMHx as noted below who presents to [facility] with Cystic fibrosis with pulmonary exacerbation (CMS-HCC).    He has been feeling progressively poorly at home over the past 2 weeks, and particularly since this weekend despite levofloxacin and airway clearance. He reports worsening cough, profound fatigue, chest and rib pain from coughing, poor appetite. Some subjective fever but has been uable to measure. Increased sputum production. He reports that the day befoire coming to the hospital he passed out after a  coughing fit and woke up on the floor. Event was not witnessed but he thinks he hit his head due to waking up with a headache. He reports that this feels typical for CF exacerbations except somewhat worse. Baseline FEV1 ~72% and recent home spirometry showed FEV1 47%. He spoke to his pulmonologist who recommended admission for IV antibiotics.    In the ED he is hemodynamically stable and afebrile. He was started on IV antibiotics per the treatment plan note from his primary pulmonologist.    CT head reassuring given recent fall. He has a negative respiratory pathogen panel and a mildly elevated WBC count. CXR with small effusion and possible atelectasis/ mucus plugging in RLL.     Allergies:  Aztreonam, [place] [aztreonam lysine], Cefepime, Other, Slo-bid 100, Tobramycin, and Banana     Medications:   Prior to Admission medications    Medication Dose, Route, Frequency   albuterol (PROVENTIL HFA;VENTOLIN HFA) 90 mcg/actuation inhaler 2 puffs, Inhalation, Every 6 hours PRN   albuterol 2.5 mg /3 mL (0.083 %) nebulizer solution 2.5 mg, Nebulization, 2 times a day   amitriptyline (ELAVIL) 100 MG tablet TAKE ONE TABLET BY MOUTH AT BEDTIME  Patient taking differently: Take 100 mg by mouth nightly.   atorvastatin (LIPITOR) 20 MG tablet 20 mg, Oral, Nightly   BETHKIS 300 mg/4 mL Nebu Inhale 1 vial via nebulization every 12 hours for 28 days, alternating every other 28 days   blood sugar diagnostic (GLUCOSE BLOOD) Strp Other, 3 times a day (with meals), Rx sent to [facility] 01/04/20    blood-glucose meter kit Dispense meter that is preferred by patient's insurance company   blood-glucose meter,continuous (DEXCOM G6 RECEIVER) Misc Use as directed   blood-glucose sensor (DEXCOM G6 SENSOR) Devi Use sq as directed every 10 days   blood-glucose transmitter (DEXCOM G6 TRANSMITTER) Devi Use as directed, replace every 90 days   budesonide-formoteroL (SYMBICORT) 160-4.5 mcg/actuation inhaler 2 puffs, Inhalation, 2 times a day (standard)   cetirizine (ZYRTEC) 10 MG tablet 10 mg, Oral, Daily (standard)   docusate sodium (COLACE) 100 MG capsule 100 mg, Oral, Daily (standard), As needed while on pain meds s/p back surgery   dornase alfa (PULMOZYME) 1 mg/mL nebulizer solution 2.5 mg, Inhalation, Daily (standard)   EASY TOUCH LANCING DEVICE Misc Use as directed.   EASY TOUCH TWIST LANCETS 30 gauge Misc Use as directed.   elexacaftor-tezacaftor-ivacaft (TRIKAFTA) 100-50-75 mg(d) /150 mg (n) tablet Take 2 Tablets (orange) by mouth in the morning and 1 tablet (blue) in the evening with fatty food   FLUoxetine 60 mg Tab 60 mg, Oral, Daily (standard)   fluticasone propionate (FLONASE) 50 mcg/actuation nasal spray 1 spray, Each Nare, Daily (standard)   FREESTYLE LIBRE 14 DAY SENSOR kit Use 3-4 times daily   gabapentin (NEURONTIN) 600 MG tablet 600 mg, Oral, 3 times a day (standard)   insulin ASPART (NOVOLOG FLEXPEN) 100 unit/mL (3 mL) injection pen Use up to 150 units/day, divided 3 TIMES DAILY BEFORE MEALS  Patient taking differently: Inject 30 Units under the skin Three (3) times a day before meals. Plus sliding scale   insulin ASPART (NOVOLOG FLEXPEN) 100 unit/mL (3 mL) injection pen Use up to 50 units/day, divided TID AC meals as per MD instructions   insulin ASPART (NOVOLOG U-100 INSULIN ASPART) 100 unit/mL injection Subcutaneously infuse up to 150 units daily via insulin pump   insulin glargine (BASAGLAR, LANTUS) 100 unit/mL (3 mL) injection pen 70 Units, Subcutaneous, Daily (standard)  Patient taking differently:  Inject 50 Units under the skin nightly.   insulin glargine (BASAGLAR, LANTUS) 100 unit/mL (3 mL) injection pen Use up to 100 units per day as per MD instructions   insulin pump cart,automated,BT (OMNIPOD 5 G6 PODS, GEN 5,) Crtg 1 each, subcutaneous (via wearable injector), Every 3 days, Change every 72 hour   lamoTRIgine (LAMICTAL) 200 MG tablet 200 mg, Oral, 2 times a day (standard)   levoFLOXacin (LEVAQUIN) 750 MG tablet 750 mg, Oral, Daily (standard)   lipase-protease-amylase (CREON) 36,000-114,000- 180,000 unit CpDR Take 8 caps with meals TID and 4 caps with snacks up to three times a day.   lisinopriL (PRINIVIL,ZESTRIL) 10 MG tablet 10 mg, Oral, Daily (standard)   melatonin 10 mg Tab 10 mg, Oral, Nightly   methocarbamoL (ROBAXIN) 750 MG tablet Take 500 mg by mouth. Every 6-8 hours as needed   montelukast (SINGULAIR) 10 mg tablet 10 mg, Oral, Nightly   mupirocin (BACTROBAN) 2 % ointment 1 application, Topical, 3 times a day (standard), 7 days   MVW COMPLETE FORMUL PROBIOTIC 40 billion cell -15 mg CpDR 1 capsule, Oral, Daily (standard)  Patient taking differently: Take 1 capsule by mouth daily.   MVW Complete, pediatric multivit 61-D3-vit K, (MVW COMPLETE FORMULATION) 1,500-800 unit-mcg cap 1 capsule, Oral, 2 times a day (standard)  Patient taking differently: Take 2 capsules by mouth Two (2) times a day.   nebulizers Misc Use as directed with inhaled medications   omeprazole (PRILOSEC) 20 MG capsule 20 mg, Oral, Daily (standard)   oxyCODONE (OXY-IR) 5 mg capsule 5 mg, Oral, Every 4 hours PRN, 5-10mg  prn pain (s/p back surgery)   pen needle, diabetic (BD ULTRA-FINE NANO PEN NEEDLE) 32 gauge x 5/32 (4 mm) Ndle use up to 4 times daily   polyethylene glycol (MIRALAX) 17 gram packet 17 g, Oral, Daily (standard), As needed while on pain meds s/p back surgery   pramipexole (MIRAPEX) 0.125 MG tablet 0.25 mg, Oral, Every evening   sodium chloride 7% 7 % Nebu Inhale 4 mL by nebulization Two (2) times a day. Increase to 4 times while taking antibiotics   traZODone (DESYREL) 150 MG tablet 150 mg, Nightly   XARELTO 20 mg tablet 20 mg, Oral, Daily (standard)       Medical History:  Past Medical History:   Diagnosis Date   ??? Anxiety    ??? Chronic pain disorder    ??? Cystic fibrosis (CMS-HCC)    ??? Depression    ??? Hypertension    ??? Lumbar radiculopathy 10/26/2020   ??? Nonproductive cough 04/05/2018       Surgical History:  Past Surgical History:   Procedure Laterality Date   ??? IR INSERT PORT AGE GREATER THAN 5 YRS  03/27/2019    IR INSERT PORT AGE GREATER THAN 5 YRS 03/27/2019 Rush Barer, MD IMG [facility]   ??? PR REMOVAL OF LUNG,LOBECTOMY Right 03/29/2017    Procedure: REMOVAL OF LUNG, OTHER THAN PNEUMONECTOMY; SINGLE LOBE (LOBECTOMY);  Surgeon: Cherie Dark, MD;  Location: [facility];  Service: Thoracic       Social History:  Social History     Socioeconomic History   ??? Marital status: Married   Tobacco Use   ??? Smoking status: Former Smoker     Types: e-Cigarettes   ??? Smokeless tobacco: Never Used   Substance and Sexual Activity   ??? Alcohol use: Not Currently     Alcohol/week: 3.0 standard drinks Types: 3 Glasses of wine per week   ???  Drug use: No   ??? Sexual activity: Not Currently     Social Determinants of Health     Financial Resource Strain: Low Risk    ??? Difficulty of Paying Living Expenses: Not hard at all   Food Insecurity: No Food Insecurity   ??? Worried About Programme researcher, broadcasting/film/video in the Last Year: Never true   ??? Ran Out of Food in the Last Year: Never true   Transportation Needs: No Transportation Needs   ??? Lack of Transportation (Medical): No   ??? Lack of Transportation (Non-Medical): No       Family History:  Family History   Problem Relation Age of Onset   ??? Bipolar disorder Mother    ??? Depression Mother        Review of Systems:  10 systems reviewed and are negative unless otherwise mentioned in HPI      Physical Exam:  Temp:  [37 ??C (98.6 ??F)] 37 ??C (98.6 ??F)  Heart Rate:  [93-102] 93  SpO2 Pulse:  [115] 115  Resp:  [20-24] 20  BP: (126-159)/(84-87) 126/84  SpO2:  [95 %-96 %] 95 %  Body mass index is 33.18 kg/m??.    General: Well appearing, alert, conversant, cooperative, and in no distress.  Eyes: No scleral icterus.   ENT: Normal appearing nose. Oropharyngeal mucus membranes moist & pink.  Neck: No lymphadenopathy.  Cardiovascular: Normal rate, regular rhythm. No murmur. Normal JVP.  Extremities: Warm to touch. Regular 2+ radial pulses bilaterally. No LE edema.  Respiratory: Normal respiratory effort on room air. Distant breath sounds.   Gastrointestinal: Soft, non-tender, non-distended with mildly hypoactive bowel sounds.  Neurologic: Alert and fully oriented. Normal speech and language.  Skin: Skin is warm, dry and intact. No rash.  Psychiatric: Mood and affect are normal. Speech and behavior are normal.      Test Results:  Data Review:    All lab results last 24 hours:    Recent Results (from the past 24 hour(s))   ECG 12 Lead    Collection Time: 06/08/21  3:27 PM   Result Value Ref Range    EKG Systolic BP  mmHg    EKG Diastolic BP  mmHg    EKG Ventricular Rate 117 BPM    EKG Atrial Rate 117 BPM    EKG P-R Interval 140 ms    EKG QRS Duration 88 ms    EKG Q-T Interval 310 ms    EKG QTC Calculation 432 ms    EKG Calculated P Axis 53 degrees    EKG Calculated R Axis 55 degrees    EKG Calculated T Axis 60 degrees    QTC Fredericia 387 ms   Comprehensive metabolic panel    Collection Time: 06/08/21  4:29 PM   Result Value Ref Range    Sodium 132 (L) 135 - 145 mmol/L    Potassium 4.3 3.4 - 4.8 mmol/L    Chloride 96 (L) 98 - 107 mmol/L    CO2 25.1 20.0 - 31.0 mmol/L    Anion Gap 11 5 - 14 mmol/L    BUN 12 9 - 23 mg/dL    Creatinine 1.61 0.96 - 1.10 mg/dL    BUN/Creatinine Ratio 15     eGFR CKD-EPI (2021) Male >90 >=60 mL/min/1.55m2    Glucose 391 (H) 70 - 179 mg/dL    Calcium 9.7 8.7 - 04.5 mg/dL    Albumin 4.2 3.4 - 5.0 g/dL    Total Protein 8.3 (H)  5.7 - 8.2 g/dL    Total Bilirubin 0.4 0.3 - 1.2 mg/dL    AST 18 <=16 U/L    ALT 29 10 - 49 U/L    Alkaline Phosphatase 147 (H) 46 - 116 U/L   hsTroponin I (serial 0-2-6H w/ delta)    Collection Time: 06/08/21  4:29 PM   Result Value Ref Range    hsTroponin I <3 <=53 ng/L   Protime-INR    Collection Time: 06/08/21  4:29 PM   Result Value Ref Range    PT 14.7 (H) 9.8 - 12.8 sec    INR 1.29    CBC w/ Differential    Collection Time: 06/08/21  4:30 PM   Result Value Ref Range    WBC 12.0 (H) 3.6 - 11.2 10*9/L    RBC 5.21 4.26 - 5.60 10*12/L    HGB 14.1 12.9 - 16.5 g/dL    HCT 10.9 60.4 - 54.0 %    MCV 81.1 77.6 - 95.7 fL    MCH 27.1 25.9 - 32.4 pg    MCHC 33.4 32.0 - 36.0 g/dL    RDW 98.1 (H) 19.1 - 15.2 %    MPV 8.2 6.8 - 10.7 fL    Platelet 360 150 - 450 10*9/L    nRBC 0 <=4 /100 WBCs    Neutrophils % 72.7 %    Lymphocytes % 17.0 %    Monocytes % 7.3 %    Eosinophils % 1.9 %    Basophils % 1.1 %    Absolute Neutrophils 8.7 (H) 1.8 - 7.8 10*9/L    Absolute Lymphocytes 2.0 1.1 - 3.6 10*9/L    Absolute Monocytes 0.9 (H) 0.3 - 0.8 10*9/L    Absolute Eosinophils 0.2 0.0 - 0.5 10*9/L    Absolute Basophils 0.1 0.0 - 0.1 10*9/L   hsTroponin I - 2 Hour    Collection Time: 06/08/21  5:52 PM   Result Value Ref Range    hsTroponin I <3 <=53 ng/L    delta hsTroponin I 0 <=7 ng/L       Imaging: Radiology studies were personally reviewed and ECG 12 Lead    Result Date: 06/08/2021  SINUS TACHYCARDIA OTHERWISE NORMAL ECG WHEN COMPARED WITH ECG OF 16-Mar-2021 22:37, NO SIGNIFICANT CHANGE WAS FOUND    CT head WO contrast    Result Date: 06/08/2021  EXAM: Computed tomography, head or brain without contrast material. DATE: 06/08/2021 4:00 PM ACCESSION: [id number] UN DICTATED: 06/08/2021 4:08 PM INTERPRETATION LOCATION: [facility] CLINICAL INDICATION: 35 years old Male with head  injury, on thinners ; Head trauma, mod - severe  COMPARISON: Sinus CT 08/21/2017 TECHNIQUE: Axial CT images of the head  from skull base to vertex without contrast. FINDINGS: There is no midline shift. No mass lesion. There is no evidence of acute infarct. No acute intracranial hemorrhage. No fractures are evident. There are postsurgical changes from bilateral maxillary antrostomies with mucosal thickening involving bilateral sphenoid sinuses as well as scattered ethmoid air cells.     No acute or post traumatic intracranial findings.    XR Chest 2 views    Result Date: 06/08/2021  EXAM: XR CHEST 2 VIEWS DATE: 06/08/2021 5:33 PM ACCESSION: [id number] UN DICTATED: 06/08/2021 5:41 PM INTERPRETATION LOCATION: [facility] CLINICAL INDICATION: 35 years old Male with SHORTNESS OF BREATH  COMPARISON: 03/16/2021 and prior exams. TECHNIQUE: PA and Lateral Chest Radiographs. FINDINGS: Left chest wall Port-A-Cath with tip projecting over the superior cavoatrial junction. Small right pleural effusion, unchanged. Increasing right  basilar opacity. Linear right apical opacity, unchanged. No pneumothorax. Unremarkable cardiomediastinal silhouette.     Small right pleural effusion with adjacent opacity, likely representing atelectasis, although superimposed infection cannot be entirely excluded.       EKG: Unchanged, QTC 432

## 2021-06-09 NOTE — Unmapped (Signed)
Oxycodone given for pain. Up to bathroom to void. No acute events overnight.   Problem: Adult Inpatient Plan of Care  Goal: Plan of Care Review  Outcome: Progressing  Goal: Patient-Specific Goal (Individualized)  Outcome: Progressing  Goal: Absence of Hospital-Acquired Illness or Injury  Outcome: Progressing  Intervention: Identify and Manage Fall Risk  Recent Flowsheet Documentation  Taken 06/09/2021 0200 by Elroy Channel, RN  Safety Interventions:   fall reduction program maintained   low bed  Taken 06/09/2021 0000 by Elroy Channel, RN  Safety Interventions:   fall reduction program maintained   low bed  Intervention: Prevent and Manage VTE (Venous Thromboembolism) Risk  Recent Flowsheet Documentation  Taken 06/09/2021 0000 by Elroy Channel, RN  Activity Management: activity adjusted per tolerance  Goal: Optimal Comfort and Wellbeing  Outcome: Progressing  Goal: Readiness for Transition of Care  Outcome: Progressing  Goal: Rounds/Family Conference  Outcome: Progressing     Problem: Infection  Goal: Absence of Infection Signs and Symptoms  Outcome: Progressing     Problem: Adjustment to Illness (Cystic Fibrosis)  Goal: Optimal Coping  Outcome: Progressing     Problem: Infection (Cystic Fibrosis)  Goal: Absence of Infection Signs and Symptoms  Outcome: Progressing     Problem: Oral Intake Inadequate (Cystic Fibrosis)  Goal: Optimal Nutrition Intake  Outcome: Progressing     Problem: Malabsorption (Cystic Fibrosis)  Goal: Optimal Bowel Elimination  Outcome: Progressing     Problem: Respiratory Compromise (Cystic Fibrosis)  Goal: Effective Oxygenation and Ventilation  Outcome: Progressing

## 2021-06-10 LAB — TOBRAMYCIN LEVEL, RANDOM: TOBRAMYCIN RANDOM: 0.7 ug/mL

## 2021-06-10 LAB — TOBRAMYCIN LEVEL, PEAK: TOBRAMYCIN PEAK: 6.9 ug/mL (ref 4.0–10.0)

## 2021-06-10 MED ADMIN — lamoTRIgine (LaMICtal) tablet 200 mg: 200 mg | ORAL | @ 14:00:00

## 2021-06-10 MED ADMIN — cetirizine (ZyrTEC) tablet 10 mg: 10 mg | ORAL | @ 14:00:00

## 2021-06-10 MED ADMIN — piperacillin-tazobactam (ZOSYN) IVPB (premix) 4.5 g: 4.5 g | INTRAVENOUS | @ 11:00:00 | Stop: 2021-06-22

## 2021-06-10 MED ADMIN — dornase alfa (PULMOZYME) 1 mg/mL solution 2.5 mg: 2.5 mg | RESPIRATORY_TRACT | @ 14:00:00

## 2021-06-10 MED ADMIN — fluticasone propionate (FLONASE) 50 mcg/actuation nasal spray 1 spray: 1 | NASAL | @ 14:00:00

## 2021-06-10 MED ADMIN — albuterol 2.5 mg /3 mL (0.083 %) nebulizer solution 2.5 mg: 2.5 mg | RESPIRATORY_TRACT | @ 14:00:00

## 2021-06-10 MED ADMIN — FLUoxetine (PROzac) capsule 60 mg: 60 mg | ORAL | @ 14:00:00

## 2021-06-10 MED ADMIN — oxyCODONE (ROXICODONE) immediate release tablet 10 mg: 10 mg | ORAL | @ 14:00:00 | Stop: 2021-06-22

## 2021-06-10 MED ADMIN — sodium chloride 7% nebulizer solution 4 mL: 4 mL | RESPIRATORY_TRACT | @ 14:00:00

## 2021-06-10 MED ADMIN — insulin glargine (LANTUS) injection 40 Units: 40 [IU] | SUBCUTANEOUS | @ 01:00:00

## 2021-06-10 MED ADMIN — insulin lispro (HumaLOG) injection 30 Units: 30 [IU] | SUBCUTANEOUS | @ 23:00:00

## 2021-06-10 MED ADMIN — insulin lispro (HumaLOG) injection 0-20 Units: 0-20 [IU] | SUBCUTANEOUS | @ 23:00:00

## 2021-06-10 MED ADMIN — lisinopriL (PRINIVIL,ZESTRIL) tablet 10 mg: 10 mg | ORAL | @ 14:00:00

## 2021-06-10 MED ADMIN — acetaminophen (TYLENOL) tablet 650 mg: 650 mg | ORAL | @ 14:00:00

## 2021-06-10 MED ADMIN — insulin lispro (HumaLOG) injection 30 Units: 30 [IU] | SUBCUTANEOUS | @ 14:00:00

## 2021-06-10 MED ADMIN — insulin lispro (HumaLOG) injection 0-20 Units: 0-20 [IU] | SUBCUTANEOUS | @ 01:00:00

## 2021-06-10 MED ADMIN — piperacillin-tazobactam (ZOSYN) IVPB (premix) 4.5 g: 4.5 g | INTRAVENOUS | @ 05:00:00 | Stop: 2021-06-22

## 2021-06-10 MED ADMIN — gabapentin (NEURONTIN) capsule 600 mg: 600 mg | ORAL | @ 21:00:00

## 2021-06-10 MED ADMIN — piperacillin-tazobactam (ZOSYN) IVPB (premix) 4.5 g: 4.5 g | INTRAVENOUS | @ 17:00:00 | Stop: 2021-06-22

## 2021-06-10 MED ADMIN — pediatric multivitamin-vit D3 1,500 unit-vit K 800 mcg (MVW COMPLETE FORMULATION) capsule: 1 | ORAL | @ 14:00:00

## 2021-06-10 MED ADMIN — elexacaftor-tezacaftor-ivacaft (TRIKAFTA) tablet 1 BLUE tablet  **PATIENT SUPPLIED MEDICATION**: 1 | ORAL | @ 21:00:00

## 2021-06-10 MED ADMIN — melatonin tablet 10.5 mg: 10 mg | ORAL | @ 01:00:00

## 2021-06-10 MED ADMIN — montelukast (SINGULAIR) tablet 10 mg: 10 mg | ORAL | @ 01:00:00

## 2021-06-10 MED ADMIN — sodium chloride 7% nebulizer solution 4 mL: 4 mL | RESPIRATORY_TRACT | @ 17:00:00

## 2021-06-10 MED ADMIN — amitriptyline (ELAVIL) tablet 100 mg: 100 mg | ORAL | @ 01:00:00

## 2021-06-10 MED ADMIN — insulin lispro (HumaLOG) injection 30 Units: 30 [IU] | SUBCUTANEOUS | @ 17:00:00

## 2021-06-10 MED ADMIN — pancrelipase (Lip-Prot-Amyl) (CREON) 24,000-76,000 -120,000 unit delayed release capsule 288,000 units of lipase: 288000 [IU] | ORAL | @ 17:00:00

## 2021-06-10 MED ADMIN — insulin lispro (HumaLOG) injection 0-20 Units: 0-20 [IU] | SUBCUTANEOUS | @ 14:00:00

## 2021-06-10 MED ADMIN — oxyCODONE (ROXICODONE) immediate release tablet 10 mg: 10 mg | ORAL | @ 01:00:00 | Stop: 2021-06-22

## 2021-06-10 MED ADMIN — traZODone (DESYREL) tablet 150 mg: 150 mg | ORAL | @ 01:00:00

## 2021-06-10 MED ADMIN — albuterol 2.5 mg /3 mL (0.083 %) nebulizer solution 2.5 mg: 2.5 mg | RESPIRATORY_TRACT | @ 17:00:00

## 2021-06-10 MED ADMIN — gabapentin (NEURONTIN) capsule 600 mg: 600 mg | ORAL | @ 14:00:00

## 2021-06-10 MED ADMIN — lamoTRIgine (LaMICtal) tablet 200 mg: 200 mg | ORAL | @ 01:00:00

## 2021-06-10 MED ADMIN — piperacillin-tazobactam (ZOSYN) IVPB (premix) 4.5 g: 4.5 g | INTRAVENOUS | @ 21:00:00 | Stop: 2021-06-22

## 2021-06-10 MED ADMIN — gabapentin (NEURONTIN) capsule 600 mg: 600 mg | ORAL | @ 01:00:00

## 2021-06-10 MED ADMIN — pancrelipase (Lip-Prot-Amyl) (CREON) 24,000-76,000 -120,000 unit delayed release capsule 288,000 units of lipase: 288000 [IU] | ORAL | @ 14:00:00

## 2021-06-10 MED ADMIN — tobramycin (NEBCIN) 260 mg in sodium chloride (NS) 0.9 % 100 mL IVPB: 260 mg | INTRAVENOUS | @ 04:00:00 | Stop: 2021-07-08

## 2021-06-10 MED ADMIN — tobramycin (NEBCIN) 260 mg in sodium chloride (NS) 0.9 % 100 mL IVPB: 260 mg | INTRAVENOUS | @ 15:00:00 | Stop: 2021-07-08

## 2021-06-10 MED ADMIN — atorvastatin (LIPITOR) tablet 20 mg: 20 mg | ORAL | @ 01:00:00

## 2021-06-10 MED ADMIN — insulin lispro (HumaLOG) injection 0-20 Units: 0-20 [IU] | SUBCUTANEOUS | @ 17:00:00

## 2021-06-10 MED ADMIN — pediatric multivitamin-vit D3 1,500 unit-vit K 800 mcg (MVW COMPLETE FORMULATION) capsule: 1 | ORAL | @ 01:00:00

## 2021-06-10 MED ADMIN — pramipexole (MIRAPEX) tablet 0.25 mg: .25 mg | ORAL | @ 21:00:00

## 2021-06-10 MED ADMIN — elexacaftor-tezacaftor-ivacaft (TRIKAFTA) tablet 2 ORANGE tablets    **PATIENT SUPPLIED MEDICATION**: 2 | ORAL | @ 14:00:00

## 2021-06-10 MED ADMIN — pantoprazole (PROTONIX) EC tablet 20 mg: 20 mg | ORAL | @ 14:00:00

## 2021-06-10 MED ADMIN — rivaroxaban (XARELTO) tablet 20 mg: 20 mg | ORAL | @ 14:00:00

## 2021-06-10 MED ADMIN — pancrelipase (Lip-Prot-Amyl) (CREON) 24,000-76,000 -120,000 unit delayed release capsule 288,000 units of lipase: 288000 [IU] | ORAL | @ 21:00:00

## 2021-06-10 NOTE — Unmapped (Signed)
Pt A&Ox4. PRN oxycodonex1 Given for pain. No acute changes during shift.       Problem: Adult Inpatient Plan of Care  Goal: Plan of Care Review  Outcome: Progressing  Goal: Patient-Specific Goal (Individualized)  Outcome: Progressing  Goal: Absence of Hospital-Acquired Illness or Injury  Outcome: Progressing  Intervention: Identify and Manage Fall Risk  Recent Flowsheet Documentation  Taken 06/09/2021 2000 by Stephens November, RN  Safety Interventions:   low bed   fall reduction program maintained  Intervention: Prevent Skin Injury  Recent Flowsheet Documentation  Taken 06/09/2021 2000 by Stephens November, RN  Skin Protection: adhesive use limited  Intervention: Prevent and Manage VTE (Venous Thromboembolism) Risk  Recent Flowsheet Documentation  Taken 06/09/2021 2000 by Stephens November, RN  Activity Management: activity encouraged  Goal: Optimal Comfort and Wellbeing  Outcome: Progressing  Goal: Readiness for Transition of Care  Outcome: Progressing  Goal: Rounds/Family Conference  Outcome: Progressing     Problem: Infection  Goal: Absence of Infection Signs and Symptoms  Outcome: Progressing  Intervention: Prevent or Manage Infection  Recent Flowsheet Documentation  Taken 06/09/2021 2000 by Stephens November, RN  Isolation Precautions: contact precautions maintained     Problem: Adjustment to Illness (Cystic Fibrosis)  Goal: Optimal Coping  Outcome: Progressing     Problem: Infection (Cystic Fibrosis)  Goal: Absence of Infection Signs and Symptoms  Outcome: Progressing  Intervention: Manage Infection and Prevent Transmission  Recent Flowsheet Documentation  Taken 06/09/2021 2000 by Stephens November, RN  Isolation Precautions: contact precautions maintained     Problem: Oral Intake Inadequate (Cystic Fibrosis)  Goal: Optimal Nutrition Intake  Outcome: Progressing     Problem: Malabsorption (Cystic Fibrosis)  Goal: Optimal Bowel Elimination  Outcome: Progressing     Problem: Respiratory Compromise (Cystic Fibrosis)  Goal: Effective Oxygenation and Ventilation  Outcome: Progressing  Intervention: Optimize Oxygenation and Ventilation  Recent Flowsheet Documentation  Taken 06/09/2021 2000 by Stephens November, RN  Head of Bed [facility]) Positioning: HOB elevated

## 2021-06-10 NOTE — Unmapped (Signed)
PULMONARY CONSULT  NOTE      Patient: Christian Young(11-30-1985)  Reason for consultation: Christian Young is a 35 y.o. male who is seen in consultation at the request of Rica Koyanagi, MD for comprehensive evaluation of Cystic Fibrosis with pulmonary exacerbation.    Assessment and Recommendations:      Principal Problem:    Cystic fibrosis with pulmonary exacerbation (CMS-HCC)  Active Problems:    Essential hypertension    Depressive disorder    Mood disorder (CMS-HCC)    Anxiety    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)    Pancreatic insufficiency due to cystic fibrosis (CMS-HCC)    History of Mycobacterium abscessus infection    Chronic deep vein thrombosis (DVT) of lower extremity (CMS-HCC)    Obesity (BMI 30-39.9)    Restless leg syndrome    Long term current use of anticoagulant therapy  Resolved Problems:    * No resolved hospital problems. *    Unclear trigger of his current exacerbation. FEV1 is down to 62% from most recently nearing 72%.  Overall he feels quite fatigued and is having ongoing sputum production but denies hemoptysis.    Recommendations:  - Continue Zosyn 4.5 mg q6h and IV tobramycin per pharmacy dosing - during last admission was on IV tobramycin 260 mg BID (has a history of AIN on zosyn in the past so needs close renal monitoring). Notably this regimen would not cover stenotrophamonas, so depending on response and updated culture results from admission may need to adjust therapies.  - Follow up CF sputum culture  - Check weekly CRP on Thursdays (ordered). Admission CRP 45.   - CBCd and CMP on Mon and Thurs (ordered)  - PFTs at admission on 10/28 with decline in FEV1 to 62%; plan for repeat PFTs at 7-10 days (Tue 11/8) to guide duration of therapy  - Continue airway clearance: Vest and aerobika at least TID, albuterol at least TID, HTS 7% TID; continue pulmozyme daily, and formulary substitution for home symbicort  - Continue home Trikafta  - 5 mg PO vitamin K twice a week while on IV antibiotics  - continue home enzymes and other multivitamins   - PT consulted for exercise as part of airway clearance  - continue home nasal spray  - Glucose monitoring while admitted, last A1c 10, low threshold to engage endocrine for CFRD if sugars difficult to control during acute exacerbation; agree with current insulin regimen  - ensure regular BMs while admitted with scheduled daily miralax and hold parameters/uptitration as needed as has history of possible SBO/ileus vs DIOS in past    Dispo: Patient typically completes the entirety of his 2 week course of antibiotics in the hospital. Will need follow up with Dr. [name] and CF Team in Clinic after discharge.       We appreciate the opportunity to assist in the care of this patient.  Please page [phone] with any questions.    Higinio Plan, MD    Subjective:      History of Present Illness:  Christian Young is a 35 y.o. male admitted for Cystic Fibrosis with pulmonary exacerbation.    He has been feeling progressively poorly at home over the past 2 weeks, and particularly since this weekend despite levofloxacin and airway clearance. He reports worsening cough, profound fatigue, chest and rib pain from coughing, poor appetite. Some subjective fever but has been uable to measure. Increased sputum production. He reports that the day befoire coming to  the hospital he passed out after a coughing fit and woke up on the floor. Event was not witnessed but he thinks he hit his head due to waking up with a headache. He reports that this feels typical for CF exacerbations except somewhat worse. Baseline FEV1 ~72% and recent home spirometry showed FEV1 47%. He spoke to his pulmonologist who recommended admission for IV antibiotics.    He reports adherence to his airway clearance as well as Trikafta.  He is unclear of the trigger to this current exacerbation.  He states he feels much more fatigued than his typical exacerbations.  Chest x-ray at admission did not appear significantly changed from priors on my review.    Interval History 06/10/21: No acute events. Continues to feel fatigued and weak with ongoing cough and chest pain with coughing. Sputum culture pending. Adherent to all airway treatments and airway clearance regimen.       Objective:      Physical Exam:  Vitals:    06/09/21 0918 06/09/21 1617 06/09/21 1957 06/09/21 2108   BP: 133/79 130/62  130/77   Pulse: 108 105 112 113   Resp: 20 18 16 16    Temp: 36.3 ??C (97.3 ??F) 36.6 ??C (97.9 ??F)  37.3 ??C (99.1 ??F)   TempSrc: Axillary Oral  Oral   SpO2: 99% 93% 94% 92%   Weight:       Height:         General: No acute distress, appears comfortable  Lying in hospital bed.  Eyes: Anicteric sclera, conjunctiva clear.  ENT:  Dry mucous membranes.  Lungs: Normal work of breathing. Speaking in full sentences.  Cardiovascular: Regular rate and rhythm.  Abdomen: Soft, non-tender, not distended  Musculoskeletal: Mild clubbing of fingers  Skin: No rashes or lesions.  Neuro: No focal neurological deficits.    Malnutrition Assessment by RD:          Diagnostic Review:   All labs and images were personally reviewed.

## 2021-06-10 NOTE — Unmapped (Signed)
Case Management Brief Assessment    Minimal discharge needs have been identified for this patient.  Anticipated needs are: None  CM will continue to follow for avoidable delays and opportunities for progression of care.   06/09/2021 6:26 PM    General:  Care Manager assessed the patient by : Medical record review, Discussion with Clinical Care team    Extended Emergency Contact Information  Primary Emergency Contact: Carmin [place] [employer] Phone: [phone]  Relation: Spouse  Preferred language: ENGLISH  Interpreter needed? No      Financial Information:  Need for financial assistance?: No         Discharge Needs:  Concerns to be Addressed: no discharge needs identified    Clinical risk factors: Multiple Diagnoses (Chronic)      Discharge Plan:  Screen findings are: Discharge planning needs identified or anticipated (Comment).    Estimated Discharge Date:     Initial Assessment complete?: Yes        Additional Information:    HCDM (patient stated preference): Griffin,Alyson - Spouse - [phone]    Social Determinants of Health     Tobacco Use: Medium Risk   ??? Smoking Tobacco Use: Former Smoker   ??? Smokeless Tobacco Use: Never Used   Alcohol Use: Not on file   Financial Resource Strain: Low Risk    ??? Difficulty of Paying Living Expenses: Not hard at all   Food Insecurity: No Food Insecurity   ??? Worried About Programme researcher, broadcasting/film/video in the Last Year: Never true   ??? Ran Out of Food in the Last Year: Never true   Transportation Needs: No Transportation Needs   ??? Lack of Transportation (Medical): No   ??? Lack of Transportation (Non-Medical): No   Physical Activity: Not on file   Stress: Not on file   Social Connections: Not on file   Intimate Partner Violence: Not on file   Depression: Not on file   Housing/Utilities: Low Risk    ??? Within the past 12 months, have you ever stayed: outside, in a car, in a tent, in an overnight shelter, or temporarily in someone else's home (i.e. couch-surfing)?: No   ??? Are you worried about losing your housing?: No   ??? Within the past 12 months, have you been unable to get utilities (heat, electricity) when it was really needed?: No   Substance Use: Not on file   Health Literacy: Low Risk    ??? : Never       Predictive Model Details          27% (High)  Factor Value    Calculated 06/09/2021 16:04 47% Number of active Rx orders 86    Dinosaur Risk of Unplanned Readmission Model 13% Number of ED visits in last six months 3     11% Number of hospitalizations in last year 3     7% ECG/EKG order present in last 6 months     6% Encounter of ten days or longer in last year present     5% Imaging order present in last 6 months     3% Active anticoagulant Rx order present     2% Age 77     2% Charlson Comorbidity Index 2     2% Future appointment scheduled     1% Active ulcer medication Rx order present     1% Current length of stay 0.862 days

## 2021-06-10 NOTE — Unmapped (Signed)
Pt received scheduled neb tx's and airway clearance via aerobika overnight.  BBS were clear/diminished.  Pt reported large amounts of thick, green sputum.  Stable on RA.    Problem: Respiratory Compromise (Cystic Fibrosis)  Goal: Effective Oxygenation and Ventilation  Outcome: Progressing  Intervention: Promote Airway Secretion Clearance  Recent Flowsheet Documentation  Taken 06/09/2021 1957 by Inda Castle, RRT  Breathing Techniques/Airway Clearance: deep/controlled cough encouraged

## 2021-06-10 NOTE — Unmapped (Signed)
VSS. Tolerating IV antibiotics well. No acute changes.   Problem: Adult Inpatient Plan of Care  Goal: Plan of Care Review  Outcome: Progressing  Goal: Patient-Specific Goal (Individualized)  Outcome: Progressing  Goal: Absence of Hospital-Acquired Illness or Injury  Outcome: Progressing  Goal: Optimal Comfort and Wellbeing  Outcome: Progressing  Goal: Readiness for Transition of Care  Outcome: Progressing  Goal: Rounds/Family Conference  Outcome: Progressing     Problem: Infection  Goal: Absence of Infection Signs and Symptoms  Outcome: Progressing     Problem: Adjustment to Illness (Cystic Fibrosis)  Goal: Optimal Coping  Outcome: Progressing     Problem: Infection (Cystic Fibrosis)  Goal: Absence of Infection Signs and Symptoms  Outcome: Progressing     Problem: Oral Intake Inadequate (Cystic Fibrosis)  Goal: Optimal Nutrition Intake  Outcome: Progressing     Problem: Malabsorption (Cystic Fibrosis)  Goal: Optimal Bowel Elimination  Outcome: Progressing     Problem: Respiratory Compromise (Cystic Fibrosis)  Goal: Effective Oxygenation and Ventilation  Outcome: Progressing

## 2021-06-10 NOTE — Unmapped (Signed)
Hospitalist Daily Progress Note     LOS: 1 day       Assessment/Plan:  Principal Problem:    Cystic fibrosis with pulmonary exacerbation (CMS-HCC)  Active Problems:    Essential hypertension    Depressive disorder    Mood disorder (CMS-HCC)    Anxiety    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)    Pancreatic insufficiency due to cystic fibrosis (CMS-HCC)    History of Mycobacterium abscessus infection    Chronic deep vein thrombosis (DVT) of lower extremity (CMS-HCC)    Obesity (BMI 30-39.9)    Restless leg syndrome    Long term current use of anticoagulant therapy  Resolved Problems:    * No resolved hospital problems. *         1.  Cystic fibrosis pulmonary exacerbation.  Appreciate pulmonary recommendations, antibiotic and other CF orders in place.  Total duration of stay to be determined based upon progress, PFT results.    2.  Essential hypertension.  Continue home regimen.    3.  Mood disorder anxiety.  Continue home fluoxetine and amitriptyline.    4.  CF induced diabetes.  Last hemoglobin A1c 10.  Continue home regimen of insulin, long-term goal of improving A1c.    5.  History of DVT, continue home rivaroxaban.    7.  Pancreatic insufficiency secondary to CF.  Continue supplement enzyme replacement.    Other chronic medical problems reviewed, medications reviewed, stable symptoms other than CF exacerbation.    *DVT prophylaxis: Home anticoagulation.    *Disposition: He will require over 2 midnights in the hospital, total duration of care to be determined, he may require 2 weeks of intravenous antibiotics.      Please page the [facility]) pager at [phone] with questions.      Consultants:   1.  Pulmonology      Subjective:   No complaint of fever, chills.  His cough was worsening but improved starting to improve with treatment.    Objective:       Vital signs in last 24 hours:  Temp:  [36.3 ??C (97.3 ??F)-36.6 ??C (97.9 ??F)] 36.6 ??C (97.9 ??F)  Heart Rate:  [93-112] 105  Resp: [18-32] 18  BP: (126-141)/(62-90) 130/62  MAP (mmHg):  [88-109] 88  SpO2:  [93 %-99 %] 93 %  BMI (Calculated):  [33.9] 33.9    Intake/Output last 24 hours:    Intake/Output Summary (Last 24 hours) at 06/09/2021 1726  Last data filed at 06/09/2021 0000  Gross per 24 hour   Intake 150 ml   Output --   Net 150 ml         Physical Exam:    Gen: Alert, NAD    HEENT: No icterus.    CV: Regular rate and rhythm.    PULM/chest: Clear to auscultation.    Abd: Robust, soft, Non tender, No palpable organomegaly    Ext: No C/C/E    Neuro: No acute focal deficits.    Skin: Warm, dry.      Medications:   Scheduled Meds:  ??? albuterol  2.5 mg Nebulization TID (RT)   ??? amitriptyline  100 mg Oral Nightly   ??? atorvastatin  20 mg Oral Nightly   ??? cetirizine  10 mg Oral Daily   ??? dornase alfa  2.5 mg Inhalation Daily   ??? elexacaftor-tezacaftor-ivacaft  2 tablet Oral Daily    And   ??? elexacaftor-tezacaftor-ivacaft  1 tablet Oral Q PM   ???  FLUoxetine  60 mg Oral Daily   ??? fluticasone furoate-vilanteroL  1 puff Inhalation Daily (RT)   ??? fluticasone propionate  1 spray Each Nare Daily   ??? gabapentin  600 mg Oral TID   ??? insulin glargine  40 Units Subcutaneous Nightly   ??? insulin lispro  0-20 Units Subcutaneous ACHS   ??? insulin lispro  30 Units Subcutaneous TID AC   ??? lamoTRIgine  200 mg Oral BID   ??? lisinopriL  10 mg Oral Daily   ??? melatonin  10.5 mg Oral Nightly   ??? montelukast  10 mg Oral Nightly   ??? pancrelipase (Lip-Prot-Amyl)  288,000 units of lipase Oral 3xd Meals   ??? pantoprazole  20 mg Oral Daily   ??? MVW Complete (pediatric multivit 61-D3-vit K)  1 capsule Oral BID   ??? phytonadione (vitamin K1)  5 mg Oral Tues,Fri   ??? piperacillin-tazobactam (ZOSYN) IV (intermittent)  4.5 g Intravenous Q6H   ??? polyethylene glycol  17 g Oral Daily   ??? pramipexole  0.25 mg Oral Q PM   ??? rivaroxaban  20 mg Oral Daily   ??? sodium chloride 7%  4 mL Nebulization TID   ??? tobramycin (NEBCIN) IVPB (conventional dosing)  260 mg Intravenous Q12H   ??? traZODone 150 mg Oral Nightly     Continuous Infusions:    Lab Results   Component Value Date    WBC 12.0 (H) 06/08/2021    HGB 14.1 06/08/2021    HCT 42.2 06/08/2021    PLT 360 06/08/2021       Lab Results   Component Value Date    NA 132 (L) 06/08/2021    K 4.3 06/08/2021    CL 96 (L) 06/08/2021    CO2 25.1 06/08/2021    BUN 12 06/08/2021    CREATININE 0.79 06/08/2021    GLU 391 (H) 06/08/2021    CALCIUM 9.7 06/08/2021    MG 1.9 12/09/2020    PHOS 3.9 08/12/2018       Lab Results   Component Value Date    BILITOT 0.4 06/08/2021    BILIDIR 0.20 07/04/2020    PROT 8.3 (H) 06/08/2021    ALBUMIN 4.2 06/08/2021    ALT 29 06/08/2021    AST 18 06/08/2021    ALKPHOS 147 (H) 06/08/2021       Lab Results   Component Value Date    PT 14.7 (H) 06/08/2021    INR 1.29 06/08/2021     Recent Labs     06/08/21  1629 06/08/21  1752   NA 132*  --    K 4.3  --    CL 96*  --    CO2 25.1  --    BUN 12  --    CREATININE 0.79  --    GLU 391*  --    CALCIUM 9.7  --    PROT 8.3*  --    BILITOT 0.4  --    AST 18  --    ALT 29  --    ALKPHOS 147*  --    CRP  --  45.0*     Recent Labs     06/08/21  1629 06/08/21  1752   TROPONINI <3 <3   INR 1.29  --      Recent Labs     06/08/21  1630   A1C 9.0*     No results for input(s): O2SOUR, FIO2ART, PHART, PCO2ART, PO2ART, HCO3ART, O2SATART, BEART in  the last 72 hours.  Pending labs:   Pending Labs     Order Current Status    AFB culture In process    CF Sputum/ CF Sinus Culture Preliminary result        35 minutes, over 50% spent in counseling and coordination of care.  Tawni Levy MD

## 2021-06-10 NOTE — Unmapped (Signed)
Aminoglycoside Therapeutic Monitoring Pharmacy Note    Christian Young is a 35 y.o. male continuing tobramycin. Date of therapy initiation: 06/08/21    Indication: CF exacerbation    Prior Dosing Information: Previous regimen 260 mg IV every 12 hours. Was on same regimen in August 2022.     Goals:  Therapeutic Drug Levels  ?? Trough level: tobramycin <1 mg/L  ?? Peak level: tobramycin 10-14 mg/L    Additional Clinical Monitoring/Outcomes  Renal function, volume status (intake and output)    Results:   ?? Trough level: 0.7 mg/L, drawn 1.5 hours from start of infusion (Extrapolated trough: 0.4 mg/L)   ?? Peak level: 6.9 mg/L, drawn 1.25 hours from end of infusion (Extrapolated Peak: 9.6 mg/L)    Wt Readings from Last 1 Encounters:   06/08/21 (!) 113.4 kg (250 lb)     Lab Results   Component Value Date    CREATININE 0.79 06/08/2021       Pharmacokinetic Considerations and Significant Drug Interactions:  ??? Adult (calculated on 06/10/21): Vd = 26.6 L, ke = 0.26 hr-1  ??? Concurrent nephrotoxic meds: Zosyn    Assessment/Plan:  Recommendation(s)  ??? Continue current regimen of 260 mg IV every 12 hours  ??? Estimated peak and trough on recommended regimen: peak = 9.0 mg/L, trough = 0.5 mg/L   ??? Last admission patient was on 260 mg IV every 12 hours and level was lower than goal range but kept due to variable levels on constant regimen. Patient also has a history of tinnitus on tobramycin AIN on Zosyn. Given risk/benefit will continue current dose and titrate up if needed. Continue to monitor renal function closely.     Follow-up  ??? Level due: in 3-5 days  ??? A pharmacist will continue to monitor and order levels as appropriate.    Please page service pharmacist with questions/clarifications.    Guerry Minors, PharmD

## 2021-06-11 MED ADMIN — sodium chloride 7% nebulizer solution 4 mL: 4 mL | RESPIRATORY_TRACT | @ 17:00:00

## 2021-06-11 MED ADMIN — tobramycin (NEBCIN) 260 mg in sodium chloride (NS) 0.9 % 100 mL IVPB: 260 mg | INTRAVENOUS | @ 16:00:00 | Stop: 2021-07-08

## 2021-06-11 MED ADMIN — polyethylene glycol (MIRALAX) packet 17 g: 17 g | ORAL | @ 13:00:00

## 2021-06-11 MED ADMIN — insulin lispro (HumaLOG) injection 30 Units: 30 [IU] | SUBCUTANEOUS | @ 17:00:00

## 2021-06-11 MED ADMIN — piperacillin-tazobactam (ZOSYN) IVPB (premix) 4.5 g: 4.5 g | INTRAVENOUS | @ 21:00:00 | Stop: 2021-06-22

## 2021-06-11 MED ADMIN — sodium chloride 7% nebulizer solution 4 mL: 4 mL | RESPIRATORY_TRACT

## 2021-06-11 MED ADMIN — insulin lispro (HumaLOG) injection 30 Units: 30 [IU] | SUBCUTANEOUS | @ 21:00:00

## 2021-06-11 MED ADMIN — insulin lispro (HumaLOG) injection 0-20 Units: 0-20 [IU] | SUBCUTANEOUS | @ 02:00:00

## 2021-06-11 MED ADMIN — gabapentin (NEURONTIN) capsule 600 mg: 600 mg | ORAL | @ 13:00:00

## 2021-06-11 MED ADMIN — lamoTRIgine (LaMICtal) tablet 200 mg: 200 mg | ORAL | @ 02:00:00

## 2021-06-11 MED ADMIN — elexacaftor-tezacaftor-ivacaft (TRIKAFTA) tablet 1 BLUE tablet  **PATIENT SUPPLIED MEDICATION**: 1 | ORAL | @ 21:00:00

## 2021-06-11 MED ADMIN — fluticasone furoate-vilanteroL (BREO ELLIPTA) 200-25 mcg/dose inhaler 1 puff: 1 | RESPIRATORY_TRACT | @ 12:00:00

## 2021-06-11 MED ADMIN — lamoTRIgine (LaMICtal) tablet 200 mg: 200 mg | ORAL | @ 13:00:00

## 2021-06-11 MED ADMIN — sodium chloride 7% nebulizer solution 4 mL: 4 mL | RESPIRATORY_TRACT | @ 12:00:00

## 2021-06-11 MED ADMIN — atorvastatin (LIPITOR) tablet 20 mg: 20 mg | ORAL | @ 02:00:00

## 2021-06-11 MED ADMIN — albuterol 2.5 mg /3 mL (0.083 %) nebulizer solution 2.5 mg: 2.5 mg | RESPIRATORY_TRACT | @ 17:00:00

## 2021-06-11 MED ADMIN — pantoprazole (PROTONIX) EC tablet 20 mg: 20 mg | ORAL | @ 13:00:00

## 2021-06-11 MED ADMIN — gabapentin (NEURONTIN) capsule 600 mg: 600 mg | ORAL | @ 02:00:00

## 2021-06-11 MED ADMIN — gabapentin (NEURONTIN) capsule 600 mg: 600 mg | ORAL | @ 17:00:00

## 2021-06-11 MED ADMIN — insulin lispro (HumaLOG) injection 0-20 Units: 0-20 [IU] | SUBCUTANEOUS | @ 16:00:00

## 2021-06-11 MED ADMIN — insulin lispro (HumaLOG) injection 30 Units: 30 [IU] | SUBCUTANEOUS | @ 13:00:00

## 2021-06-11 MED ADMIN — insulin glargine (LANTUS) injection 40 Units: 40 [IU] | SUBCUTANEOUS | @ 02:00:00

## 2021-06-11 MED ADMIN — pancrelipase (Lip-Prot-Amyl) (CREON) 24,000-76,000 -120,000 unit delayed release capsule 288,000 units of lipase: 288000 [IU] | ORAL | @ 17:00:00

## 2021-06-11 MED ADMIN — melatonin tablet 10.5 mg: 10 mg | ORAL | @ 02:00:00

## 2021-06-11 MED ADMIN — elexacaftor-tezacaftor-ivacaft (TRIKAFTA) tablet 2 ORANGE tablets    **PATIENT SUPPLIED MEDICATION**: 2 | ORAL | @ 13:00:00

## 2021-06-11 MED ADMIN — amitriptyline (ELAVIL) tablet 100 mg: 100 mg | ORAL | @ 02:00:00

## 2021-06-11 MED ADMIN — albuterol 2.5 mg /3 mL (0.083 %) nebulizer solution 2.5 mg: 2.5 mg | RESPIRATORY_TRACT | @ 12:00:00

## 2021-06-11 MED ADMIN — piperacillin-tazobactam (ZOSYN) IVPB (premix) 4.5 g: 4.5 g | INTRAVENOUS | @ 17:00:00 | Stop: 2021-06-22

## 2021-06-11 MED ADMIN — piperacillin-tazobactam (ZOSYN) IVPB (premix) 4.5 g: 4.5 g | INTRAVENOUS | @ 04:00:00 | Stop: 2021-06-22

## 2021-06-11 MED ADMIN — piperacillin-tazobactam (ZOSYN) IVPB (premix) 4.5 g: 4.5 g | INTRAVENOUS | @ 09:00:00 | Stop: 2021-06-22

## 2021-06-11 MED ADMIN — FLUoxetine (PROzac) capsule 60 mg: 60 mg | ORAL | @ 13:00:00

## 2021-06-11 MED ADMIN — traZODone (DESYREL) tablet 150 mg: 150 mg | ORAL | @ 02:00:00

## 2021-06-11 MED ADMIN — dornase alfa (PULMOZYME) 1 mg/mL solution 2.5 mg: 2.5 mg | RESPIRATORY_TRACT | @ 12:00:00

## 2021-06-11 MED ADMIN — cetirizine (ZyrTEC) tablet 10 mg: 10 mg | ORAL | @ 13:00:00

## 2021-06-11 MED ADMIN — tobramycin (NEBCIN) 260 mg in sodium chloride (NS) 0.9 % 100 mL IVPB: 260 mg | INTRAVENOUS | @ 03:00:00 | Stop: 2021-07-08

## 2021-06-11 MED ADMIN — pediatric multivitamin-vit D3 1,500 unit-vit K 800 mcg (MVW COMPLETE FORMULATION) capsule: 1 | ORAL | @ 13:00:00

## 2021-06-11 MED ADMIN — lisinopriL (PRINIVIL,ZESTRIL) tablet 10 mg: 10 mg | ORAL | @ 13:00:00

## 2021-06-11 MED ADMIN — albuterol 2.5 mg /3 mL (0.083 %) nebulizer solution 2.5 mg: 2.5 mg | RESPIRATORY_TRACT

## 2021-06-11 MED ADMIN — rivaroxaban (XARELTO) tablet 20 mg: 20 mg | ORAL | @ 13:00:00

## 2021-06-11 MED ADMIN — insulin lispro (HumaLOG) injection 0-20 Units: 0-20 [IU] | SUBCUTANEOUS | @ 21:00:00

## 2021-06-11 MED ADMIN — insulin lispro (HumaLOG) injection 0-20 Units: 0-20 [IU] | SUBCUTANEOUS | @ 13:00:00

## 2021-06-11 MED ADMIN — montelukast (SINGULAIR) tablet 10 mg: 10 mg | ORAL | @ 02:00:00

## 2021-06-11 MED ADMIN — pediatric multivitamin-vit D3 1,500 unit-vit K 800 mcg (MVW COMPLETE FORMULATION) capsule: 1 | ORAL | @ 02:00:00

## 2021-06-11 MED ADMIN — oxyCODONE (ROXICODONE) immediate release tablet 10 mg: 10 mg | ORAL | @ 19:00:00 | Stop: 2021-06-22

## 2021-06-11 MED ADMIN — fluticasone propionate (FLONASE) 50 mcg/actuation nasal spray 1 spray: 1 | NASAL | @ 13:00:00

## 2021-06-11 MED ADMIN — oxyCODONE (ROXICODONE) immediate release tablet 10 mg: 10 mg | ORAL | @ 02:00:00 | Stop: 2021-06-22

## 2021-06-11 MED ADMIN — pancrelipase (Lip-Prot-Amyl) (CREON) 24,000-76,000 -120,000 unit delayed release capsule 288,000 units of lipase: 288000 [IU] | ORAL | @ 13:00:00

## 2021-06-11 MED ADMIN — pancrelipase (Lip-Prot-Amyl) (CREON) 24,000-76,000 -120,000 unit delayed release capsule 288,000 units of lipase: 288000 [IU] | ORAL | @ 21:00:00

## 2021-06-11 NOTE — Unmapped (Signed)
Admitted for cystic fibrosis exacerbation. A&Ox4.  ABT continues. No adverse reactions noted. Chest port intact and patent. Resting quietly in bed. Call bell within reach. Will continue to monitor.  Problem: Adult Inpatient Plan of Care  Goal: Plan of Care Review  Outcome: Ongoing - Unchanged     Problem: Infection  Goal: Absence of Infection Signs and Symptoms  Outcome: Ongoing - Unchanged  Intervention: Prevent or Manage Infection  Recent Flowsheet Documentation  Taken 06/10/2021 2200 by Emmaline Kluver, RN  Isolation Precautions: contact precautions maintained  Taken 06/10/2021 2000 by Emmaline Kluver, RN  Isolation Precautions: contact precautions maintained

## 2021-06-11 NOTE — Unmapped (Signed)
Problem: Adjustment to Illness (Cystic Fibrosis)  Goal: Optimal Coping  Outcome: Ongoing - Unchanged     Problem: Infection (Cystic Fibrosis)  Goal: Absence of Infection Signs and Symptoms  Outcome: Ongoing - Unchanged     Problem: Oral Intake Inadequate (Cystic Fibrosis)  Goal: Optimal Nutrition Intake  Outcome: Ongoing - Unchanged     Problem: Malabsorption (Cystic Fibrosis)  Goal: Optimal Bowel Elimination  Outcome: Ongoing - Unchanged     Problem: Respiratory Compromise (Cystic Fibrosis)  Goal: Effective Oxygenation and Ventilation  Outcome: Ongoing - Unchanged  Intervention: Promote Airway Secretion Clearance  Recent Flowsheet Documentation  Taken 06/10/2021 2012 by Verdell Face, RRT  Breathing Techniques/Airway Clearance: deep/controlled cough encouraged         All neb tx & airway clearance therapies given as scheduled with no adverse reaction.  No respiratory distress noted

## 2021-06-11 NOTE — Unmapped (Signed)
PULMONARY CONSULT  NOTE      Patient: Christian Young(May 26, 1986)  Reason for consultation: Christian Young is a 35 y.o. male who is seen in consultation at the request of Rica Koyanagi, MD for comprehensive evaluation of Cystic Fibrosis with pulmonary exacerbation.    Assessment and Recommendations:      Principal Problem:    Cystic fibrosis with pulmonary exacerbation (CMS-HCC)  Active Problems:    Essential hypertension    Depressive disorder    Mood disorder (CMS-HCC)    Anxiety    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)    Pancreatic insufficiency due to cystic fibrosis (CMS-HCC)    History of Mycobacterium abscessus infection    Chronic deep vein thrombosis (DVT) of lower extremity (CMS-HCC)    Obesity (BMI 30-39.9)    Restless leg syndrome    Long term current use of anticoagulant therapy  Resolved Problems:    * No resolved hospital problems. *    Unclear trigger of his current exacerbation. FEV1 is down to 62% from most recently nearing 72%.  Overall he feels quite fatigued and is having ongoing sputum production but denies hemoptysis.    Recommendations:  - Continue Zosyn 4.5 mg q6h and IV tobramycin per pharmacy dosing - during last admission was on IV tobramycin 260 mg BID (has a history of AIN on zosyn in the past so needs close renal monitoring). Notably this regimen would not cover stenotrophamonas, so depending on response and updated culture results from admission may need to adjust therapies.  - Follow up CF sputum culture to final: currently growing staph aureus and OP flora; previously has grown MSSA and no history of MRSA, thus suspect this is MSSA and current antibiotic regimen should be appropriate but will follow susceptibilities. If does not grow Pseudomonas, will discuss need for ongoing Pseudomonal coverage with his primary pulmonologist (Dr. Audrea Young)  - Check weekly CRP on Thursdays (ordered). Admission CRP 45.   - CBCd and CMP on Mon and Thurs (ordered)  - PFTs at admission on 10/28 with decline in FEV1 to 62%; plan for repeat PFTs at 7-10 days (Tue 11/8) to guide duration of therapy  - Continue airway clearance: Vest and aerobika at least TID, albuterol at least TID, HTS 7% TID; continue pulmozyme daily, and formulary substitution for home symbicort  - Continue home Trikafta  - 5 mg PO vitamin K twice a week while on IV antibiotics  - continue home enzymes and other multivitamins   - PT consulted for exercise as part of airway clearance  - continue home nasal spray  - Glucose monitoring while admitted, last A1c 10, low threshold to engage endocrine for CFRD if sugars difficult to control during acute exacerbation; agree with current insulin regimen  - ensure regular BMs while admitted with scheduled daily miralax and hold parameters/uptitration as needed as has history of possible SBO/ileus vs DIOS in past    Dispo: Patient typically completes the entirety of his 2 week course of antibiotics in the hospital. Will need follow up with Dr. Audrea Young and CF Team in Clinic after discharge.       We appreciate the opportunity to assist in the care of this patient.  Please page [phone] with any questions.    Higinio Plan, MD    Subjective:      History of Present Illness:  Christian Young is a 35 y.o. male admitted for Cystic Fibrosis with pulmonary exacerbation.    He has been feeling progressively  poorly at home over the past 2 weeks, and particularly since this weekend despite levofloxacin and airway clearance. He reports worsening cough, profound fatigue, chest and rib pain from coughing, poor appetite. Some subjective fever but has been uable to measure. Increased sputum production. He reports that the day befoire coming to the hospital he passed out after a coughing fit and woke up on the floor. Event was not witnessed but he thinks he hit his head due to waking up with a headache. He reports that this feels typical for CF exacerbations except somewhat worse. Baseline FEV1 ~72% and recent home spirometry showed FEV1 47%. He spoke to his pulmonologist who recommended admission for IV antibiotics.    He reports adherence to his airway clearance as well as Trikafta.  He is unclear of the trigger to this current exacerbation.  He states he feels much more fatigued than his typical exacerbations.  Chest x-ray at admission did not appear significantly changed from priors on my review.    Interval History 06/11/21: No acute events. Sleeping. Remains fatigued. Sputum culture with Staph Aureus.       Objective:      Physical Exam:  Vitals:    06/09/21 2108 06/10/21 2017 06/10/21 2111 06/11/21 0810   BP: 130/77  139/79 117/71   Pulse: 113 110 115 94   Resp: 16 18 18 18    Temp: 37.3 ??C (99.1 ??F)  36.9 ??C (98.4 ??F) 36.2 ??C (97.1 ??F)   TempSrc: Oral  Oral Temporal   SpO2: 92% 97% 95% 96%   Weight:       Height:         General: No acute distress, appears comfortable  Sleeping in hospital bed.  Eyes: closed  Lungs: Normal work of breathing.  Cardiovascular: Regular rate and rhythm.  Abdomen: Soft, non-tender, not distended  Musculoskeletal: Mild clubbing of fingers  Skin: No rashes or lesions.  Neuro: Sleeping    Malnutrition Assessment by RD:          Diagnostic Review:   All labs and images were personally reviewed.

## 2021-06-11 NOTE — Unmapped (Signed)
Hospitalist Daily Progress Note     LOS: 3 days       Assessment/Plan:  Principal Problem:    Cystic fibrosis with pulmonary exacerbation (CMS-HCC)  Active Problems:    Essential hypertension    Depressive disorder    Mood disorder (CMS-HCC)    Anxiety    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)    Pancreatic insufficiency due to cystic fibrosis (CMS-HCC)    History of Mycobacterium abscessus infection    Chronic deep vein thrombosis (DVT) of lower extremity (CMS-HCC)    Obesity (BMI 30-39.9)    Restless leg syndrome    Long term current use of anticoagulant therapy  Resolved Problems:    * No resolved hospital problems. *         1.  Cystic fibrosis pulmonary exacerbation.  He has now grown staph aureus, sensitivities pending and mucoid Pseudomonas aeruginosa, sensitivities pending.  Appreciate Pulmonology recommendations, antibiotic and other CF orders in place, continue Zosyn and tobramycin for now, adjust antibiotics based upon sensitivities as available.  Total duration of stay to be determined based upon progress, PFT results.  Anticipate he will need 14 days of therapy.  Depending on the antibiotics and mode and frequency of administration, unclear if he will remain in the hospital or complete his antibiotics at home.    2.  Essential hypertension.  Continue home regimen.    3.  Mood disorder anxiety.  Continue home fluoxetine and amitriptyline.    4.  CF induced diabetes.  Last hemoglobin A1c 10.  Continue home regimen of insulin, long-term goal of improving A1c.    5.  History of DVT, continue home rivaroxaban.    7.  Pancreatic insufficiency secondary to CF.  Continue supplement enzyme replacement.    Other chronic medical problems reviewed, medications reviewed, stable symptoms other than CF exacerbation.    *DVT prophylaxis: Home anticoagulation.    *Disposition: He will require over 2 midnights in the hospital, total duration of care to be determined, he may require 2 weeks of intravenous antibiotics.      Please page the [facility]) pager at [phone] with questions.      Consultants:   1.  Pulmonology      Subjective:     No acute events overnight.No complaint of fever, chills.    Objective:       Vital signs in last 24 hours:  Temp:  [36.2 ??C (97.1 ??F)-36.9 ??C (98.4 ??F)] 36.7 ??C (98.1 ??F)  Heart Rate:  [94-115] 96  Resp:  [18] 18  BP: (107-139)/(53-79) 107/53  MAP (mmHg):  [74-101] 74  SpO2:  [94 %-97 %] 94 %    Intake/Output last 24 hours:  No intake or output data in the 24 hours ending 06/11/21 1553      Physical Exam:    Gen: Alert, in good spirits.    HEENT: No icterus.    CV: Regular rate and rhythm.    PULM/chest: No adventitial breath sounds appreciated.  Good air exchange bilaterally.    Abd: Robust, soft, Non tender, No palpable organomegaly    Ext: No C/C/E    Neuro: No acute focal deficits.    Skin: Warm, dry.      Medications:   Scheduled Meds:  ??? albuterol  2.5 mg Nebulization TID (RT)   ??? amitriptyline  100 mg Oral Nightly   ??? atorvastatin  20 mg Oral Nightly   ??? cetirizine  10 mg Oral Daily   ???  dornase alfa  2.5 mg Inhalation Daily   ??? elexacaftor-tezacaftor-ivacaft  2 tablet Oral Daily    And   ??? elexacaftor-tezacaftor-ivacaft  1 tablet Oral Q PM   ??? FLUoxetine  60 mg Oral Daily   ??? fluticasone furoate-vilanteroL  1 puff Inhalation Daily (RT)   ??? fluticasone propionate  1 spray Each Nare Daily   ??? gabapentin  600 mg Oral TID   ??? insulin glargine  40 Units Subcutaneous Nightly   ??? insulin lispro  0-20 Units Subcutaneous ACHS   ??? insulin lispro  30 Units Subcutaneous TID AC   ??? lamoTRIgine  200 mg Oral BID   ??? lisinopriL  10 mg Oral Daily   ??? melatonin  10.5 mg Oral Nightly   ??? montelukast  10 mg Oral Nightly   ??? pancrelipase (Lip-Prot-Amyl)  288,000 units of lipase Oral 3xd Meals   ??? pantoprazole  20 mg Oral Daily   ??? MVW Complete (pediatric multivit 61-D3-vit K)  1 capsule Oral BID   ??? phytonadione (vitamin K1)  5 mg Oral Tues,Fri   ??? piperacillin-tazobactam (ZOSYN) IV (intermittent)  4.5 g Intravenous Q6H   ??? polyethylene glycol  17 g Oral Daily   ??? pramipexole  0.25 mg Oral Q PM   ??? rivaroxaban  20 mg Oral Daily   ??? sodium chloride 7%  4 mL Nebulization TID   ??? tobramycin (NEBCIN) IVPB (conventional dosing)  260 mg Intravenous Q12H   ??? traZODone  150 mg Oral Nightly     Continuous Infusions:    Lab Results   Component Value Date    WBC 12.0 (H) 06/08/2021    HGB 14.1 06/08/2021    HCT 42.2 06/08/2021    PLT 360 06/08/2021       Lab Results   Component Value Date    NA 132 (L) 06/08/2021    K 4.3 06/08/2021    CL 96 (L) 06/08/2021    CO2 25.1 06/08/2021    BUN 12 06/08/2021    CREATININE 0.79 06/08/2021    GLU 391 (H) 06/08/2021    CALCIUM 9.7 06/08/2021    MG 1.9 12/09/2020    PHOS 3.9 08/12/2018       Lab Results   Component Value Date    BILITOT 0.4 06/08/2021    BILIDIR 0.20 07/04/2020    PROT 8.3 (H) 06/08/2021    ALBUMIN 4.2 06/08/2021    ALT 29 06/08/2021    AST 18 06/08/2021    ALKPHOS 147 (H) 06/08/2021       Lab Results   Component Value Date    PT 14.7 (H) 06/08/2021    INR 1.29 06/08/2021     Recent Labs     06/08/21  1629 06/08/21  1752   TROPONINI <3 <3   INR 1.29  --      Recent Labs     06/08/21  1630   A1C 9.0*       Pending Labs     Order Current Status    AFB culture In process    CF Sputum/ CF Sinus Culture Preliminary result        25 minutes, over 50% spent in counseling and coordination of care.  Tawni Levy MD

## 2021-06-11 NOTE — Unmapped (Signed)
Hospitalist Daily Progress Note     LOS: 2 days       Assessment/Plan:  Principal Problem:    Cystic fibrosis with pulmonary exacerbation (CMS-HCC)  Active Problems:    Essential hypertension    Depressive disorder    Mood disorder (CMS-HCC)    Anxiety    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)    Pancreatic insufficiency due to cystic fibrosis (CMS-HCC)    History of Mycobacterium abscessus infection    Chronic deep vein thrombosis (DVT) of lower extremity (CMS-HCC)    Obesity (BMI 30-39.9)    Restless leg syndrome    Long term current use of anticoagulant therapy  Resolved Problems:    * No resolved hospital problems. *         1.  Cystic fibrosis pulmonary exacerbation.  Appreciate pulmonary recommendations, antibiotic and other CF orders in place.  Total duration of stay to be determined based upon progress, PFT results.    2.  Essential hypertension.  Continue home regimen.    3.  Mood disorder anxiety.  Continue home fluoxetine and amitriptyline.    4.  CF induced diabetes.  Last hemoglobin A1c 10.  Continue home regimen of insulin, long-term goal of improving A1c.    5.  History of DVT, continue home rivaroxaban.    7.  Pancreatic insufficiency secondary to CF.  Continue supplement enzyme replacement.    Other chronic medical problems reviewed, medications reviewed, stable symptoms other than CF exacerbation.    *DVT prophylaxis: Home anticoagulation.    *Disposition: He will require over 2 midnights in the hospital, total duration of care to be determined, he may require 2 weeks of intravenous antibiotics.      Please page the [facility]) pager at [phone] with questions.      Consultants:   1.  Pulmonology      Subjective:   No complaint of fever, chills.  No acute events overnight.    Objective:       Vital signs in last 24 hours:  Temp:  [37.3 ??C (99.1 ??F)] 37.3 ??C (99.1 ??F)  Heart Rate:  [112-113] 113  Resp:  [16] 16  BP: (130)/(77) 130/77  MAP (mmHg):  [96] 96  SpO2: [92 %-94 %] 92 %    Intake/Output last 24 hours:    Intake/Output Summary (Last 24 hours) at 06/10/2021 1800  Last data filed at 06/10/2021 0156  Gross per 24 hour   Intake 216.5 ml   Output --   Net 216.5 ml         Physical Exam:    Gen: Resting comfortably.    HEENT: No icterus.    CV: Regular rate and rhythm.    PULM/chest: No adventitial breath sounds appreciated.    Abd: Robust, soft, Non tender, No palpable organomegaly    Ext: No C/C/E    Neuro: No acute focal deficits.    Skin: Warm, dry.      Medications:   Scheduled Meds:  ??? albuterol  2.5 mg Nebulization TID (RT)   ??? amitriptyline  100 mg Oral Nightly   ??? atorvastatin  20 mg Oral Nightly   ??? cetirizine  10 mg Oral Daily   ??? dornase alfa  2.5 mg Inhalation Daily   ??? elexacaftor-tezacaftor-ivacaft  2 tablet Oral Daily    And   ??? elexacaftor-tezacaftor-ivacaft  1 tablet Oral Q PM   ??? FLUoxetine  60 mg Oral Daily   ??? fluticasone furoate-vilanteroL  1 puff Inhalation Daily (RT)   ??? fluticasone propionate  1 spray Each Nare Daily   ??? gabapentin  600 mg Oral TID   ??? insulin glargine  40 Units Subcutaneous Nightly   ??? insulin lispro  0-20 Units Subcutaneous ACHS   ??? insulin lispro  30 Units Subcutaneous TID AC   ??? lamoTRIgine  200 mg Oral BID   ??? lisinopriL  10 mg Oral Daily   ??? melatonin  10.5 mg Oral Nightly   ??? montelukast  10 mg Oral Nightly   ??? pancrelipase (Lip-Prot-Amyl)  288,000 units of lipase Oral 3xd Meals   ??? pantoprazole  20 mg Oral Daily   ??? MVW Complete (pediatric multivit 61-D3-vit K)  1 capsule Oral BID   ??? phytonadione (vitamin K1)  5 mg Oral Tues,Fri   ??? piperacillin-tazobactam (ZOSYN) IV (intermittent)  4.5 g Intravenous Q6H   ??? polyethylene glycol  17 g Oral Daily   ??? pramipexole  0.25 mg Oral Q PM   ??? rivaroxaban  20 mg Oral Daily   ??? sodium chloride 7%  4 mL Nebulization TID   ??? tobramycin (NEBCIN) IVPB (conventional dosing)  260 mg Intravenous Q12H   ??? traZODone  150 mg Oral Nightly     Continuous Infusions:    Lab Results   Component Value Date    WBC 12.0 (H) 06/08/2021    HGB 14.1 06/08/2021    HCT 42.2 06/08/2021    PLT 360 06/08/2021       Lab Results   Component Value Date    NA 132 (L) 06/08/2021    K 4.3 06/08/2021    CL 96 (L) 06/08/2021    CO2 25.1 06/08/2021    BUN 12 06/08/2021    CREATININE 0.79 06/08/2021    GLU 391 (H) 06/08/2021    CALCIUM 9.7 06/08/2021    MG 1.9 12/09/2020    PHOS 3.9 08/12/2018       Lab Results   Component Value Date    BILITOT 0.4 06/08/2021    BILIDIR 0.20 07/04/2020    PROT 8.3 (H) 06/08/2021    ALBUMIN 4.2 06/08/2021    ALT 29 06/08/2021    AST 18 06/08/2021    ALKPHOS 147 (H) 06/08/2021       Lab Results   Component Value Date    PT 14.7 (H) 06/08/2021    INR 1.29 06/08/2021     Recent Labs     06/08/21  1629 06/08/21  1752   TROPONINI <3 <3   INR 1.29  --      Recent Labs     06/08/21  1630   A1C 9.0*     No results for input(s): O2SOUR, FIO2ART, PHART, PCO2ART, PO2ART, HCO3ART, O2SATART, BEART in the last 72 hours.  Pending labs:   Pending Labs     Order Current Status    AFB culture In process    CF Sputum/ CF Sinus Culture Preliminary result        25 minutes, over 50% spent in counseling and coordination of care.  Tawni Levy MD

## 2021-06-11 NOTE — Unmapped (Signed)
No acute change in patient condition during the shift, fall and safety precautions maintained, will continue to monitor.  Problem: Adult Inpatient Plan of Care  Goal: Plan of Care Review  Outcome: Progressing  Goal: Patient-Specific Goal (Individualized)  Outcome: Progressing  Goal: Optimal Comfort and Wellbeing  Outcome: Progressing     Problem: Adjustment to Illness (Cystic Fibrosis)  Goal: Optimal Coping  Outcome: Progressing     Problem: Infection (Cystic Fibrosis)  Goal: Absence of Infection Signs and Symptoms  Outcome: Progressing  Intervention: Manage Infection and Prevent Transmission  Recent Flowsheet Documentation  Taken 06/11/2021 1000 by Corena Herter, RN  Isolation Precautions: contact precautions maintained  Taken 06/11/2021 0810 by Corena Herter, RN  Isolation Precautions: contact precautions maintained

## 2021-06-12 LAB — COMPREHENSIVE METABOLIC PANEL
ALBUMIN: 3.3 g/dL — ABNORMAL LOW (ref 3.4–5.0)
ALKALINE PHOSPHATASE: 89 U/L (ref 46–116)
ALT (SGPT): 36 U/L (ref 10–49)
ANION GAP: 6 mmol/L (ref 5–14)
AST (SGOT): 25 U/L (ref <=34)
BILIRUBIN TOTAL: 0.3 mg/dL (ref 0.3–1.2)
BLOOD UREA NITROGEN: 8 mg/dL — ABNORMAL LOW (ref 9–23)
BUN / CREAT RATIO: 10
CALCIUM: 8.7 mg/dL (ref 8.7–10.4)
CHLORIDE: 101 mmol/L (ref 98–107)
CO2: 32.3 mmol/L — ABNORMAL HIGH (ref 20.0–31.0)
CREATININE: 0.82 mg/dL
EGFR CKD-EPI (2021) MALE: >90 mL/min/{1.73_m2} (ref >=60)
GLUCOSE RANDOM: 210 mg/dL — ABNORMAL HIGH (ref 70–179)
POTASSIUM: 4.8 mmol/L (ref 3.4–4.8)
PROTEIN TOTAL: 6.7 g/dL (ref 5.7–8.2)
SODIUM: 139 mmol/L (ref 135–145)

## 2021-06-12 LAB — CBC W/ AUTO DIFF
BASOPHILS ABSOLUTE COUNT: 0.1 10*9/L (ref 0.0–0.1)
BASOPHILS RELATIVE PERCENT: 1.1 %
EOSINOPHILS ABSOLUTE COUNT: 0.4 10*9/L (ref 0.0–0.5)
EOSINOPHILS RELATIVE PERCENT: 5.6 %
HEMATOCRIT: 35.4 % — ABNORMAL LOW (ref 39.0–48.0)
HEMOGLOBIN: 12 g/dL — ABNORMAL LOW (ref 12.9–16.5)
LYMPHOCYTES ABSOLUTE COUNT: 1.2 10*9/L (ref 1.1–3.6)
LYMPHOCYTES RELATIVE PERCENT: 17.3 %
MEAN CORPUSCULAR HEMOGLOBIN CONC: 34 g/dL (ref 32.0–36.0)
MEAN CORPUSCULAR HEMOGLOBIN: 27.5 pg (ref 25.9–32.4)
MEAN CORPUSCULAR VOLUME: 81 fL (ref 77.6–95.7)
MEAN PLATELET VOLUME: 7.8 fL (ref 6.8–10.7)
MONOCYTES ABSOLUTE COUNT: 0.9 10*9/L — ABNORMAL HIGH (ref 0.3–0.8)
MONOCYTES RELATIVE PERCENT: 12.6 %
NEUTROPHILS ABSOLUTE COUNT: 4.3 10*9/L (ref 1.8–7.8)
NEUTROPHILS RELATIVE PERCENT: 63.4 %
NUCLEATED RED BLOOD CELLS: 0 /100{WBCs} (ref <=4)
PLATELET COUNT: 291 10*9/L (ref 150–450)
RED BLOOD CELL COUNT: 4.37 10*12/L (ref 4.26–5.60)
RED CELL DISTRIBUTION WIDTH: 15.2 % (ref 12.2–15.2)
WBC ADJUSTED: 6.8 10*9/L (ref 3.6–11.2)

## 2021-06-12 LAB — PROTIME-INR
INR: 1.25
PROTIME: 14.3 s — ABNORMAL HIGH (ref 9.8–12.8)

## 2021-06-12 MED ADMIN — pediatric multivitamin-vit D3 1,500 unit-vit K 800 mcg (MVW COMPLETE FORMULATION) capsule: 1 | ORAL | @ 12:00:00

## 2021-06-12 MED ADMIN — pancrelipase (Lip-Prot-Amyl) (CREON) 24,000-76,000 -120,000 unit delayed release capsule 288,000 units of lipase: 288000 [IU] | ORAL | @ 16:00:00

## 2021-06-12 MED ADMIN — pediatric multivitamin-vit D3 1,500 unit-vit K 800 mcg (MVW COMPLETE FORMULATION) capsule: 1 | ORAL | @ 01:00:00

## 2021-06-12 MED ADMIN — insulin lispro (HumaLOG) injection 0-20 Units: 0-20 [IU] | SUBCUTANEOUS | @ 01:00:00

## 2021-06-12 MED ADMIN — lisinopriL (PRINIVIL,ZESTRIL) tablet 10 mg: 10 mg | ORAL | @ 12:00:00

## 2021-06-12 MED ADMIN — lamoTRIgine (LaMICtal) tablet 200 mg: 200 mg | ORAL | @ 01:00:00

## 2021-06-12 MED ADMIN — insulin glargine (LANTUS) injection 40 Units: 40 [IU] | SUBCUTANEOUS | @ 01:00:00

## 2021-06-12 MED ADMIN — insulin lispro (HumaLOG) injection 30 Units: 30 [IU] | SUBCUTANEOUS | @ 16:00:00

## 2021-06-12 MED ADMIN — insulin lispro (HumaLOG) injection 0-20 Units: 0-20 [IU] | SUBCUTANEOUS | @ 16:00:00

## 2021-06-12 MED ADMIN — pantoprazole (PROTONIX) EC tablet 20 mg: 20 mg | ORAL | @ 12:00:00

## 2021-06-12 MED ADMIN — tobramycin (NEBCIN) 260 mg in sodium chloride (NS) 0.9 % 100 mL IVPB: 260 mg | INTRAVENOUS | @ 15:00:00 | Stop: 2021-07-08

## 2021-06-12 MED ADMIN — lamoTRIgine (LaMICtal) tablet 200 mg: 200 mg | ORAL | @ 12:00:00

## 2021-06-12 MED ADMIN — gabapentin (NEURONTIN) capsule 600 mg: 600 mg | ORAL | @ 12:00:00

## 2021-06-12 MED ADMIN — oxyCODONE (ROXICODONE) immediate release tablet 10 mg: 10 mg | ORAL | @ 15:00:00 | Stop: 2021-06-22

## 2021-06-12 MED ADMIN — fluticasone propionate (FLONASE) 50 mcg/actuation nasal spray 1 spray: 1 | NASAL | @ 14:00:00

## 2021-06-12 MED ADMIN — albuterol 2.5 mg /3 mL (0.083 %) nebulizer solution 2.5 mg: 2.5 mg | RESPIRATORY_TRACT | @ 12:00:00

## 2021-06-12 MED ADMIN — pramipexole (MIRAPEX) tablet 0.25 mg: .25 mg | ORAL | @ 01:00:00

## 2021-06-12 MED ADMIN — elexacaftor-tezacaftor-ivacaft (TRIKAFTA) tablet 2 ORANGE tablets    **PATIENT SUPPLIED MEDICATION**: 2 | ORAL | @ 12:00:00

## 2021-06-12 MED ADMIN — amitriptyline (ELAVIL) tablet 100 mg: 100 mg | ORAL | @ 01:00:00

## 2021-06-12 MED ADMIN — pancrelipase (Lip-Prot-Amyl) (CREON) 24,000-76,000 -120,000 unit delayed release capsule 288,000 units of lipase: 288000 [IU] | ORAL | @ 12:00:00

## 2021-06-12 MED ADMIN — piperacillin-tazobactam (ZOSYN) IVPB (premix) 4.5 g: 4.5 g | INTRAVENOUS | @ 16:00:00 | Stop: 2021-06-22

## 2021-06-12 MED ADMIN — piperacillin-tazobactam (ZOSYN) IVPB (premix) 4.5 g: 4.5 g | INTRAVENOUS | @ 21:00:00 | Stop: 2021-06-22

## 2021-06-12 MED ADMIN — piperacillin-tazobactam (ZOSYN) IVPB (premix) 4.5 g: 4.5 g | INTRAVENOUS | @ 05:00:00 | Stop: 2021-06-22

## 2021-06-12 MED ADMIN — melatonin tablet 10.5 mg: 10 mg | ORAL | @ 01:00:00

## 2021-06-12 MED ADMIN — insulin lispro (HumaLOG) injection 30 Units: 30 [IU] | SUBCUTANEOUS | @ 12:00:00

## 2021-06-12 MED ADMIN — gabapentin (NEURONTIN) capsule 600 mg: 600 mg | ORAL | @ 01:00:00

## 2021-06-12 MED ADMIN — sodium chloride 7% nebulizer solution 4 mL: 4 mL | RESPIRATORY_TRACT | @ 13:00:00

## 2021-06-12 MED ADMIN — albuterol 2.5 mg /3 mL (0.083 %) nebulizer solution 2.5 mg: 2.5 mg | RESPIRATORY_TRACT | @ 17:00:00

## 2021-06-12 MED ADMIN — dornase alfa (PULMOZYME) 1 mg/mL solution 2.5 mg: 2.5 mg | RESPIRATORY_TRACT | @ 13:00:00

## 2021-06-12 MED ADMIN — fluticasone furoate-vilanteroL (BREO ELLIPTA) 200-25 mcg/dose inhaler 1 puff: 1 | RESPIRATORY_TRACT | @ 13:00:00

## 2021-06-12 MED ADMIN — piperacillin-tazobactam (ZOSYN) IVPB (premix) 4.5 g: 4.5 g | INTRAVENOUS | @ 09:00:00 | Stop: 2021-06-22

## 2021-06-12 MED ADMIN — insulin lispro (HumaLOG) injection 0-20 Units: 0-20 [IU] | SUBCUTANEOUS | @ 12:00:00

## 2021-06-12 MED ADMIN — cetirizine (ZyrTEC) tablet 10 mg: 10 mg | ORAL | @ 12:00:00

## 2021-06-12 MED ADMIN — gabapentin (NEURONTIN) capsule 600 mg: 600 mg | ORAL | @ 18:00:00

## 2021-06-12 MED ADMIN — pancrelipase (Lip-Prot-Amyl) (CREON) 24,000-76,000 -120,000 unit delayed release capsule 288,000 units of lipase: 288000 [IU] | ORAL | @ 21:00:00

## 2021-06-12 MED ADMIN — montelukast (SINGULAIR) tablet 10 mg: 10 mg | ORAL | @ 01:00:00

## 2021-06-12 MED ADMIN — FLUoxetine (PROzac) capsule 60 mg: 60 mg | ORAL | @ 12:00:00

## 2021-06-12 MED ADMIN — tobramycin (NEBCIN) 260 mg in sodium chloride (NS) 0.9 % 100 mL IVPB: 260 mg | INTRAVENOUS | @ 03:00:00 | Stop: 2021-07-08

## 2021-06-12 MED ADMIN — sodium chloride 7% nebulizer solution 4 mL: 4 mL | RESPIRATORY_TRACT | @ 17:00:00

## 2021-06-12 MED ADMIN — polyethylene glycol (MIRALAX) packet 17 g: 17 g | ORAL | @ 12:00:00

## 2021-06-12 MED ADMIN — traZODone (DESYREL) tablet 150 mg: 150 mg | ORAL | @ 01:00:00

## 2021-06-12 MED ADMIN — atorvastatin (LIPITOR) tablet 20 mg: 20 mg | ORAL | @ 01:00:00

## 2021-06-12 MED ADMIN — albuterol 2.5 mg /3 mL (0.083 %) nebulizer solution 2.5 mg: 2.5 mg | RESPIRATORY_TRACT

## 2021-06-12 MED ADMIN — rivaroxaban (XARELTO) tablet 20 mg: 20 mg | ORAL | @ 12:00:00

## 2021-06-12 MED ADMIN — elexacaftor-tezacaftor-ivacaft (TRIKAFTA) tablet 1 BLUE tablet  **PATIENT SUPPLIED MEDICATION**: 1 | ORAL | @ 21:00:00

## 2021-06-12 MED ADMIN — sodium chloride 7% nebulizer solution 4 mL: 4 mL | RESPIRATORY_TRACT

## 2021-06-12 MED ADMIN — oxyCODONE (ROXICODONE) immediate release tablet 10 mg: 10 mg | ORAL | @ 01:00:00 | Stop: 2021-06-22

## 2021-06-12 NOTE — Unmapped (Signed)
Admitted for cystic fibrosis exacerbation. Continues to receive ABT via left chest port. No adverse reactions noted. A&Ox4. Medicated x1 for pain. Call bell within reach. Will continue to monitor.  Problem: Adult Inpatient Plan of Care  Goal: Plan of Care Review  Outcome: Ongoing - Unchanged     Problem: Infection  Goal: Absence of Infection Signs and Symptoms  Outcome: Ongoing - Unchanged  Intervention: Prevent or Manage Infection  Recent Flowsheet Documentation  Taken 06/11/2021 2000 by Emmaline Kluver, RN  Isolation Precautions: contact precautions maintained

## 2021-06-12 NOTE — Unmapped (Signed)
PULMONARY CONSULT  NOTE      Patient: Christian Young(August 18, 1985)  Reason for consultation: Mr. Flinchbaugh is a 35 y.o. male who is seen in consultation at the request of Rica Koyanagi, MD for comprehensive evaluation of Cystic Fibrosis with pulmonary exacerbation.    Assessment and Recommendations:      Principal Problem:    Cystic fibrosis with pulmonary exacerbation (CMS-HCC)  Active Problems:    Essential hypertension    Depressive disorder    Mood disorder (CMS-HCC)    Anxiety    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)    Pancreatic insufficiency due to cystic fibrosis (CMS-HCC)    History of Mycobacterium abscessus infection    Chronic deep vein thrombosis (DVT) of lower extremity (CMS-HCC)    Obesity (BMI 30-39.9)    Restless leg syndrome    Long term current use of anticoagulant therapy  Resolved Problems:    * No resolved hospital problems. *    Unclear trigger of his current exacerbation. FEV1 is down to 62% from most recently nearing 72%.  Overall he feels quite fatigued and is having ongoing sputum production but denies hemoptysis.    Recommendations:  - Continue Zosyn 4.5 mg q6h and IV tobramycin per pharmacy dosing - during last admission was on IV tobramycin 260 mg BID (has a history of AIN on zosyn in the past so needs close renal monitoring). Notably this regimen would not cover stenotrophamonas, so depending on response and updated culture results from admission may need to adjust therapies.  - Follow up CF sputum culture to final: currently growing MSSA and mucoid PSAS;   - Check weekly CRP on Thursdays (ordered). Admission CRP 45.   - CBCd and CMP on Mon and Thurs (ordered)  - PFTs at admission on 10/28 with decline in FEV1 to 62%; plan for repeat PFTs at 7-10 days (Tue 11/8) to guide duration of therapy  - Continue airway clearance: Vest and aerobika at least TID, albuterol at least TID, HTS 7% TID; continue pulmozyme daily, and formulary substitution for home symbicort  - Continue home Trikafta  - 5 mg PO vitamin K twice a week while on IV antibiotics  - continue home enzymes and other multivitamins   - PT consulted for exercise as part of airway clearance  - continue home nasal spray  - Glucose monitoring while admitted, last A1c 10, low threshold to engage endocrine for CFRD if sugars difficult to control during acute exacerbation; agree with current insulin regimen  - ensure regular BMs while admitted with scheduled daily miralax and hold parameters/uptitration as needed as has history of possible SBO/ileus vs DIOS in past    Dispo: Will plan on checking spirometry on Thursday.  If improving, then can go home Friday to complete IV Abx.        We appreciate the opportunity to assist in the care of this patient.  Please page [phone] with any questions.    Verneita Griffes, MD    Subjective:      History of Present Illness:  Mr. Bogan is a 35 y.o. male admitted for Cystic Fibrosis with pulmonary exacerbation.    He has been feeling progressively poorly at home over the past 2 weeks, and particularly since this weekend despite levofloxacin and airway clearance. He reports worsening cough, profound fatigue, chest and rib pain from coughing, poor appetite. Some subjective fever but has been uable to measure. Increased sputum production. He reports that the day befoire coming to  the hospital he passed out after a coughing fit and woke up on the floor. Event was not witnessed but he thinks he hit his head due to waking up with a headache. He reports that this feels typical for CF exacerbations except somewhat worse. Baseline FEV1 ~72% and recent home spirometry showed FEV1 47%. He spoke to his pulmonologist who recommended admission for IV antibiotics.    He reports adherence to his airway clearance as well as Trikafta.  He is unclear of the trigger to this current exacerbation.  He states he feels much more fatigued than his typical exacerbations.  Chest x-ray at admission did not appear significantly changed from priors on my review.    Interval History 06/12/21: No acute events. Fatigue and SOB improving.        Objective:      Physical Exam:  Vitals:    06/11/21 1944 06/11/21 2016 06/11/21 2115 06/12/21 0813   BP: 116/71  124/72 117/73   Pulse: 102 97 107 86   Resp: 18 18 18 18    Temp: 37.1 ??C (98.8 ??F)  36.6 ??C (97.9 ??F) 36.7 ??C (98.1 ??F)   TempSrc: Oral  Oral Temporal   SpO2: 94% 94% 93% 96%   Weight:       Height:         General: No acute distress, appears comfortable  Sleeping in hospital bed.  Eyes: closed  Lungs: Normal work of breathing.  Cardiovascular: Regular rate and rhythm.  Abdomen: Soft, non-tender, not distended  Musculoskeletal: Mild clubbing of fingers  Skin: No rashes or lesions.  Neuro: Sleeping    Malnutrition Assessment by RD:          Diagnostic Review:   All labs and images were personally reviewed.

## 2021-06-12 NOTE — Unmapped (Signed)
No new complain from patient during the shift, fall and safety precautions maintained  Problem: Adult Inpatient Plan of Care  Goal: Plan of Care Review  Outcome: Progressing  Goal: Patient-Specific Goal (Individualized)  Outcome: Progressing     Problem: Adjustment to Illness (Cystic Fibrosis)  Goal: Optimal Coping  Outcome: Progressing     Problem: Oral Intake Inadequate (Cystic Fibrosis)  Goal: Optimal Nutrition Intake  Outcome: Progressing

## 2021-06-12 NOTE — Unmapped (Signed)
Patient remained stable on room air. All scheduled breathing treatments given. Patient has congestive coughing after treatment. Airway clearance done with [place] and home chest vest

## 2021-06-12 NOTE — Unmapped (Signed)
[facility]  Adult Cystic Fibrosis Clinic        SW REFERRAL SOURCE/REASON: Christian Young is a 35 y.o. male followed by [facility] for his CF. Focus of conversation today is conducting assessment and addressing psychosocial needs. Assessment was completed via phone.                    CURRENT LIVING ARRANGEMENTS/SUPPORTS:   Christian Young lives in [place] with his wife. He reports a good support system of friends and family.        SAFETY:    Denies safety concerns.         TRANSPORTATION:   Christian Young acknowledges that gas prices are a hardship. Will complete a referral for the [employer] gas and grocery assistance program at his next clinic visit.        EDUCATION/EMPLOYMENT/FINANCIAL STATUS:     Christian Young shared that he recently quit his job due to issues with upper management. He states that finances will be tight until he finds a new job. His wife works as an Museum/gallery exhibitions officer but is in school to be a Radiation protection practitioner. She finishes her schooling next week and will continue to work for [employer] as a paramedic. Discussed requesting financial assistance from [place] but Christian Young states he has reached his assistance limit for this year. He understands he can request assistance from [place] starting in January 2023.         HOBBIES/INTERESTS:   Christian Young enjoys playing video games     INSURANCE:   Christian Young currently has Medical sales representative through his wife. He denies concerns with coverage.     FOOD INSECURITY:   Christian Young endorses some food insecurity and this SW sent him a link with information on local food panties. Will also plan to complete a referral for the [employer] gas and grocery assistance program at his next clinic visit.     ADHERENCE:   No current concerns.     MENTAL HEALTH/COPING: SUBSTANCE USE:      Screeners were not administered as assessment was completed by phone. Discussed Christian Young's mood and he states he has good days and bad days. On the bad days he finds that he has no motivation, feels more irritable, and wants to stay in bed. He is currently taking Lamictal, Prozac, and Elavil and these are prescribed by his PCP. Christian Young had a psychiatric NP last year but states the practice closed and the provider began working as a general NP. His PCP has agreed to prescribe his medications until he can establish with a new psychiatric provider.     This SW used [employer] to search for psychiatrists in [place] but there were none listed. Sent Christian Young a link via Clinical cytogeneticist of a Garment/textile technologist in [place] that accepts [employer] and provides medication management. Christian Young has participated in the CALM study and has one more session to complete the study. Will plan on screening Christian Young at his next in person clinic visit.     LONG-TERM HEALTH CARE PLANNING:   Did not address, will address at a future visit.       PLAN:   Will plan to submit a referral for food and gas assistance at his next clinic visit. Sent him a link of local food pantries and a resource for a clinic in [place] that provides therapy and medication management. Continue to provide on-going psychiatric support.     Luella Gardenhire Waneta Martins, LCSW

## 2021-06-12 NOTE — Unmapped (Signed)
Problem: Adjustment to Illness (Cystic Fibrosis)  Goal: Optimal Coping  Outcome: Ongoing - Unchanged     Problem: Infection (Cystic Fibrosis)  Goal: Absence of Infection Signs and Symptoms  Outcome: Ongoing - Unchanged     Problem: Oral Intake Inadequate (Cystic Fibrosis)  Goal: Optimal Nutrition Intake  Outcome: Ongoing - Unchanged     Problem: Malabsorption (Cystic Fibrosis)  Goal: Optimal Bowel Elimination  Outcome: Ongoing - Unchanged     Problem: Respiratory Compromise (Cystic Fibrosis)  Goal: Effective Oxygenation and Ventilation  Outcome: Ongoing - Unchanged  Intervention: Promote Airway Secretion Clearance  Recent Flowsheet Documentation  Taken 06/11/2021 2000 by Verdell Face, RRT  Breathing Techniques/Airway Clearance: deep/controlled cough encouraged           All neb tx & airway clearance therapies given as scheduled with no adverse reaction. No respiratory distress noted

## 2021-06-13 LAB — BASIC METABOLIC PANEL
ANION GAP: 9 mmol/L (ref 5–14)
BLOOD UREA NITROGEN: 9 mg/dL (ref 9–23)
BUN / CREAT RATIO: 13
CALCIUM: 8.7 mg/dL (ref 8.7–10.4)
CHLORIDE: 99 mmol/L (ref 98–107)
CO2: 27.8 mmol/L (ref 20.0–31.0)
CREATININE: 0.7 mg/dL
EGFR CKD-EPI (2021) MALE: >90 mL/min/{1.73_m2} (ref >=60)
GLUCOSE RANDOM: 253 mg/dL — ABNORMAL HIGH (ref 70–179)
POTASSIUM: 4 mmol/L (ref 3.4–4.8)
SODIUM: 136 mmol/L (ref 135–145)

## 2021-06-13 LAB — TOBRAMYCIN LEVEL, PEAK: TOBRAMYCIN PEAK: 4.7 ug/mL (ref 4.0–10.0)

## 2021-06-13 LAB — TOBRAMYCIN LEVEL, TROUGH: TOBRAMYCIN TROUGH: <0.3 ug/mL (ref 0.0–2.0)

## 2021-06-13 MED ADMIN — pramipexole (MIRAPEX) tablet 0.25 mg: .25 mg | ORAL | @ 22:00:00

## 2021-06-13 MED ADMIN — lamoTRIgine (LaMICtal) tablet 200 mg: 200 mg | ORAL

## 2021-06-13 MED ADMIN — piperacillin-tazobactam (ZOSYN) IVPB (premix) 4.5 g: 4.5 g | INTRAVENOUS | @ 04:00:00 | Stop: 2021-06-22

## 2021-06-13 MED ADMIN — gabapentin (NEURONTIN) capsule 600 mg: 600 mg | ORAL

## 2021-06-13 MED ADMIN — insulin lispro (HumaLOG) injection 0-20 Units: 0-20 [IU] | SUBCUTANEOUS | @ 22:00:00

## 2021-06-13 MED ADMIN — phytonadione (vitamin K1) (MEPHYTON) tablet 5 mg: 5 mg | ORAL | @ 13:00:00

## 2021-06-13 MED ADMIN — pediatric multivitamin-vit D3 1,500 unit-vit K 800 mcg (MVW COMPLETE FORMULATION) capsule: 1 | ORAL

## 2021-06-13 MED ADMIN — melatonin tablet 10.5 mg: 10 mg | ORAL

## 2021-06-13 MED ADMIN — atorvastatin (LIPITOR) tablet 20 mg: 20 mg | ORAL

## 2021-06-13 MED ADMIN — oxyCODONE (ROXICODONE) immediate release tablet 5 mg: 5 mg | ORAL | @ 20:00:00 | Stop: 2021-06-22

## 2021-06-13 MED ADMIN — albuterol 2.5 mg /3 mL (0.083 %) nebulizer solution 2.5 mg: 2.5 mg | RESPIRATORY_TRACT | @ 13:00:00

## 2021-06-13 MED ADMIN — cetirizine (ZyrTEC) tablet 10 mg: 10 mg | ORAL | @ 13:00:00

## 2021-06-13 MED ADMIN — piperacillin-tazobactam (ZOSYN) IVPB (premix) 4.5 g: 4.5 g | INTRAVENOUS | @ 10:00:00 | Stop: 2021-06-22

## 2021-06-13 MED ADMIN — montelukast (SINGULAIR) tablet 10 mg: 10 mg | ORAL

## 2021-06-13 MED ADMIN — insulin lispro (HumaLOG) injection 30 Units: 30 [IU] | SUBCUTANEOUS | @ 12:00:00

## 2021-06-13 MED ADMIN — sodium chloride 7% nebulizer solution 4 mL: 4 mL | RESPIRATORY_TRACT

## 2021-06-13 MED ADMIN — amitriptyline (ELAVIL) tablet 100 mg: 100 mg | ORAL

## 2021-06-13 MED ADMIN — sodium chloride 7% nebulizer solution 4 mL: 4 mL | RESPIRATORY_TRACT | @ 18:00:00

## 2021-06-13 MED ADMIN — tobramycin (NEBCIN) 260 mg in sodium chloride (NS) 0.9 % 100 mL IVPB: 260 mg | INTRAVENOUS | @ 15:00:00 | Stop: 2021-06-13

## 2021-06-13 MED ADMIN — pediatric multivitamin-vit D3 1,500 unit-vit K 800 mcg (MVW COMPLETE FORMULATION) capsule: 1 | ORAL | @ 13:00:00

## 2021-06-13 MED ADMIN — insulin lispro (HumaLOG) injection 0-20 Units: 0-20 [IU] | SUBCUTANEOUS | @ 12:00:00

## 2021-06-13 MED ADMIN — elexacaftor-tezacaftor-ivacaft (TRIKAFTA) tablet 1 BLUE tablet  **PATIENT SUPPLIED MEDICATION**: 1 | ORAL | @ 22:00:00

## 2021-06-13 MED ADMIN — insulin lispro (HumaLOG) injection 30 Units: 30 [IU] | SUBCUTANEOUS | @ 17:00:00

## 2021-06-13 MED ADMIN — albuterol 2.5 mg /3 mL (0.083 %) nebulizer solution 2.5 mg: 2.5 mg | RESPIRATORY_TRACT

## 2021-06-13 MED ADMIN — elexacaftor-tezacaftor-ivacaft (TRIKAFTA) tablet 2 ORANGE tablets    **PATIENT SUPPLIED MEDICATION**: 2 | ORAL | @ 13:00:00

## 2021-06-13 MED ADMIN — albuterol 2.5 mg /3 mL (0.083 %) nebulizer solution 2.5 mg: 2.5 mg | RESPIRATORY_TRACT | @ 18:00:00

## 2021-06-13 MED ADMIN — gabapentin (NEURONTIN) capsule 600 mg: 600 mg | ORAL | @ 17:00:00

## 2021-06-13 MED ADMIN — dornase alfa (PULMOZYME) 1 mg/mL solution 2.5 mg: 2.5 mg | RESPIRATORY_TRACT | @ 13:00:00

## 2021-06-13 MED ADMIN — insulin glargine (LANTUS) injection 40 Units: 40 [IU] | SUBCUTANEOUS

## 2021-06-13 MED ADMIN — insulin lispro (HumaLOG) injection 30 Units: 30 [IU] | SUBCUTANEOUS | @ 22:00:00

## 2021-06-13 MED ADMIN — pancrelipase (Lip-Prot-Amyl) (CREON) 24,000-76,000 -120,000 unit delayed release capsule 288,000 units of lipase: 288000 [IU] | ORAL | @ 22:00:00

## 2021-06-13 MED ADMIN — sodium chloride 7% nebulizer solution 4 mL: 4 mL | RESPIRATORY_TRACT | @ 13:00:00

## 2021-06-13 MED ADMIN — insulin lispro (HumaLOG) injection 0-20 Units: 0-20 [IU] | SUBCUTANEOUS

## 2021-06-13 MED ADMIN — lamoTRIgine (LaMICtal) tablet 200 mg: 200 mg | ORAL | @ 13:00:00

## 2021-06-13 MED ADMIN — insulin lispro (HumaLOG) injection 0-20 Units: 0-20 [IU] | SUBCUTANEOUS | @ 17:00:00

## 2021-06-13 MED ADMIN — pantoprazole (PROTONIX) EC tablet 20 mg: 20 mg | ORAL | @ 13:00:00

## 2021-06-13 MED ADMIN — gabapentin (NEURONTIN) capsule 600 mg: 600 mg | ORAL | @ 13:00:00

## 2021-06-13 MED ADMIN — piperacillin-tazobactam (ZOSYN) IVPB (premix) 4.5 g: 4.5 g | INTRAVENOUS | @ 22:00:00 | Stop: 2021-06-22

## 2021-06-13 MED ADMIN — rivaroxaban (XARELTO) tablet 20 mg: 20 mg | ORAL | @ 13:00:00

## 2021-06-13 MED ADMIN — fluticasone furoate-vilanteroL (BREO ELLIPTA) 200-25 mcg/dose inhaler 1 puff: 1 | RESPIRATORY_TRACT | @ 13:00:00

## 2021-06-13 MED ADMIN — fluticasone propionate (FLONASE) 50 mcg/actuation nasal spray 1 spray: 1 | NASAL | @ 13:00:00

## 2021-06-13 MED ADMIN — piperacillin-tazobactam (ZOSYN) IVPB (premix) 4.5 g: 4.5 g | INTRAVENOUS | @ 16:00:00 | Stop: 2021-06-22

## 2021-06-13 MED ADMIN — oxyCODONE (ROXICODONE) immediate release tablet 10 mg: 10 mg | ORAL | Stop: 2021-06-22

## 2021-06-13 MED ADMIN — lisinopriL (PRINIVIL,ZESTRIL) tablet 10 mg: 10 mg | ORAL | @ 13:00:00

## 2021-06-13 MED ADMIN — traZODone (DESYREL) tablet 150 mg: 150 mg | ORAL

## 2021-06-13 MED ADMIN — FLUoxetine (PROzac) capsule 60 mg: 60 mg | ORAL | @ 13:00:00

## 2021-06-13 MED ADMIN — pramipexole (MIRAPEX) tablet 0.25 mg: .25 mg | ORAL

## 2021-06-13 MED ADMIN — polyethylene glycol (MIRALAX) packet 17 g: 17 g | ORAL | @ 13:00:00

## 2021-06-13 MED ADMIN — pancrelipase (Lip-Prot-Amyl) (CREON) 24,000-76,000 -120,000 unit delayed release capsule 288,000 units of lipase: 288000 [IU] | ORAL | @ 16:00:00

## 2021-06-13 MED ADMIN — pancrelipase (Lip-Prot-Amyl) (CREON) 24,000-76,000 -120,000 unit delayed release capsule 288,000 units of lipase: 288000 [IU] | ORAL | @ 13:00:00

## 2021-06-13 MED ADMIN — tobramycin (NEBCIN) 260 mg in sodium chloride (NS) 0.9 % 100 mL IVPB: 260 mg | INTRAVENOUS | @ 03:00:00 | Stop: 2021-07-08

## 2021-06-13 NOTE — Unmapped (Signed)
Cystic Fibrosis Nutrition Assessment    Inpatient, Telehealth via phone: Nutrition Risk Screening this admission for CF and related follow up  Primary Pulmonary Provider: Dr [name]  ===================================================================  Christian Young is a 35 y.o. male w/ PMH significant for cystic fibrosis, pancreatic insufficiency, diabetes who presented on 10/27 with CF exacerbation.     11/1: Christian Young was in good spirits today; feeling much better overall. He reports ongoing improvements in appetite and intake and requested several snacks (chips, peanut butter and jelly sandwich, fruit cup, milk, powerade). No reported GI issues. Last documented BM 10/30 x 1.  ===================================================================  INTERVENTION:    1. Defer to team re: Vit K while on IV antibiotics given anticoagulation with Xarelto    2. Ordered several snacks in computrition - see above    3. Continue remainder of nutrition regimen:  - High Calorie Diet  - bowel regimen as ordered  - insulin regimen per endocrinology  - MVW gel cap BID  - enzyme regimen    Inpatient:   Will follow up with patient per protocol: 1-2 times per week (and more frequent as indicated)  Monitor for potential discharge needs with multi-disciplinary team.   ===================================================================  ASSESSMENT:  Nutrition Category = Adult Obesity Class 1: BMI 30 to < 35 kg/m2    Estimated daily needs: 3284 kcal/day, 136-181 g PRO/day, 3368 mL fluid/day      Calories estimated using: Cystic Fibrosis Conference Formula, protein per DRI x 1.5-2, fluid per [facility]    Current diet is appropriate for CF. Current PO intake is adequate to meet estimated CF needs. Patient continues to work towards goals for weight management.   Enzyme dose is within established guidelines. Patient would benefit from adjustment in enzyme regimen. Vitamin prescription is appropriate to reach/maintain optimal fat soluble vitamin levels. Patint to benefit from adjustment/inititiation of acid reducer for GERD and enzyme activation. Patient on CFTR modulator is consuming adequate amounts of fat-containing foods with prescribed medication to optimize absorption.    ASPEN/AND Malnutrition Screening:  Patient does not meet ASPEN/AND criteria for malnutrition at this time.    Goals:  1. Ongoing:  Meet estimated daily needs  2. Ongoing:  Reach/maintain established anthropometric goals for Adult CF: BMI < 30 kg/m2   3. Ongoing:  Normal fat-soluble vitamin levels: Vitamin A, Vitamin E and PT per lab range; Vitamin D 25OH total >30   4. Ongoing:  Maintain glucose control. Carbohydrate content of diet should comprise 40-50% of total calorie needs, but carbohydrates are not restricted in this population.    5. Ongoing:  Meet sodium needs for CF     Nutrition goals reviewed, and relevant barriers identified and addressed: none evident.   Patient is evaluated to have good  willingness and ability to achieve nutrition goals.   ===================================================================  INPATIENT:  Christian Young is admitted with Shortness of breath [R06.02]  Productive cough [R05.8]  Chest pain, unspecified type [R07.9].    Current Nutrition Orders (inpatient):  Oral intake        Nutrition Orders   (From admission, onward)             Start     Ordered    06/08/21 2055  Nutrition Therapy High Calorie High Protein  Effective now        Question:  Nutrition Therapy:  Answer:  High Calorie High Protein    06/08/21 2054              CF Nutrition  related medications (inpatient): Nutritionally relevant medications reviewed.   Lipitor  trikafta  Insulin per endo  Creon 24,000 x 12 caps/meal, 5 caps/snack  protonix  MVW complete formulation gel cap BID  Vit K 5mg  2x weekly  miralax once daily  PRN senna  PRN zofran  PRN tums    CF Nutrition related labs (inpatient): reviewed  ==================================================================  CLINICAL DATA:  Past Medical History:   Diagnosis Date   ??? Anxiety    ??? Chronic pain disorder    ??? Cystic fibrosis (CMS-HCC)    ??? Depression    ??? Hypertension    ??? Lumbar radiculopathy 10/26/2020   ??? Nonproductive cough 04/05/2018     Patient Active Problem List   Diagnosis   ??? Essential hypertension   ??? Depressive disorder   ??? Mood disorder (CMS-HCC)   ??? Anxiety   ??? 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 Chronic pansinusitis   ??? Pancreatic insufficiency due to cystic fibrosis (CMS-HCC)   ??? History of Mycobacterium abscessus infection   ??? Bronchiectasis (CMS-HCC)   ??? Chronic deep vein thrombosis (DVT) of lower extremity (CMS-HCC)   ??? Cystic fibrosis with pulmonary exacerbation (CMS-HCC)   ??? Cystic fibrosis (CMS-HCC)   ??? Obesity (BMI 30-39.9)   ??? Restless leg syndrome   ??? Degeneration of lumbar intervertebral disc   ??? Lumbar radiculopathy   ??? Idiopathic peripheral neuropathy   ??? Long term current use of anticoagulant therapy   ??? Pain of lumbar spine   ??? Acute non-recurrent pansinusitis     Anthroprometric Evaluation:  Weight changes: weight uptrending over past ~3 months   CFTR modulator and weight change: On Trikafta  BMI Readings from Last 3 Encounters:   06/08/21 33.91 kg/m??   03/14/21 33.20 kg/m??   03/09/21 32.53 kg/m??     Wt Readings from Last 3 Encounters:   06/08/21 (!) 113.4 kg (250 lb)   03/14/21 (!) 111.2 kg (245 lb 1.6 oz)   03/09/21 (!) 109 kg (240 lb 3.2 oz)     Ht Readings from Last 3 Encounters:   06/08/21 182.9 cm (6')   03/14/21 183 cm (6' 0.05)   03/09/21 183 cm (6' 0.05)     ==================================================================  Energy Intake (outpatient):  Diet: Unable to obtain detailed nutrition history; [name] was napping at time of call.    Allergies, Intolerances, Sensitivities, and/or Cultural/Religious Dietary Restrictions:  include banana  Allergies   Allergen Reactions   ??? Aztreonam Anaphylaxis, Hives, Nausea And Vomiting and Rash     fevers  Patient stated that he only vomited x1 with [place] in the past.????    fevers  Patient stated that he only vomited x1 with [place] in the past.????   ??? [place] [Aztreonam Lysine] Anaphylaxis   ??? Cefepime Itching, Nausea Only and Other (See Comments)     Headaches also   ??? Other Anaphylaxis and Other (See Comments)     Other reaction(s): Other (See Comments)  Bananas: itchy throat  Slo Bid record from [employer] states anaphylaxis.????Pt states this was from childhood and does not know reaction.  Bananas, causes itchy throat   ??? Slo-Bid 100 Anaphylaxis   ??? Tobramycin Tinnitus     From OSH record-documented as tinnitus but has received IV tobra with close monitoring.  Pt has received Tobramycin at [facility] since this allergy documented    From OSH record-documented as tinnitus but has received IV tobra with close monitoring.   ??? Banana Itching and Nausea  And Vomiting   Sodium in diet: historically adequate  Calcium in diet:  historically adequate  Food Insecurity:   Food Insecurity: No Food Insecurity   ??? Worried About Programme researcher, broadcasting/film/video in the Last Year: Never true   ??? Ran Out of Food in the Last Year: Never true   CFTR modulator and Diet: Prescribed Trikafta (elexacaftor/tezacaftor/ivacaftor).  PO Supplements: none  Patient resources for DME/formula:  -none  Appetite Stimulant: none  Enteral feeding tube: none  Physical activity: limited recently d/t back pain; not discussed today    GI/Malabsorption (outpatient):  Enzyme brand, (meals/snacks):  Creon 36,000 @ 8/meal and 4/snack  Enzyme administration details: correct pre-meal administration., good compliance at all meals and snacks  Enzyme dose per MEAL (units lipase/kg/meal) 2539  Enzyme dose per DAY (units lipase/kg/day) 16109  GI meds:  Nutritionally relevant medications reviewed. Miralax, colace, probiotic, prilosec  Stools: no documented BMs this admission  Fecal Fat Studies: insufficient  No results found for: [id number]  Lab Results   Component Value Date    ELAST <15 (L) 03/19/2017     No results found for: PELAI    Vitamins/Minerals (outpatient):  CF-specific MVI, dose, compliance: MVW Complete Formulation Softgel regular 2 daily, good compliance  Other vitamins/minerals/herbals: none  Patient Resources for vitamins: [employer]  phone [phone]. Active through September 2023.  Calcium supplement: none  Fat-soluble vitamin levels: within normal limits June 2022 with exception of PT. PT historically elevated with normal DCP. Would likely benefit from recheck of DCP.  Lab Results   Component Value Date    VITAMINA 58.7 02/06/2021    VITAMINA 42.4 01/18/2020    VITAMINA 44.4 11/19/2016     Lab Results   Component Value Date    CRP 45.0 (H) 06/08/2021    CRP <4.0 03/14/2021    CRP <4.0 11/24/2020     Lab Results   Component Value Date    VITDTOTAL 38.2 02/06/2021    VITDTOTAL 27.9 03/19/2017     Lab Results   Component Value Date    VITAME 16.0 02/06/2021     Lab Results   Component Value Date    PT 14.3 (H) 06/12/2021    PT 14.7 (H) 06/08/2021    PT 15.4 (H) 03/20/2021     Lab Results   Component Value Date    DESGCARBPT 0.2 03/20/2017     No results found for: PIVKAII    Bone Health: Normal vitamin D level. Adequate calcium intake from diet. Normal bone density on DEXA 02/2017.    CF Related Diabetes: yes; on insulin. Followed by endocrinology.  Lab Results   Component Value Date    GLUF 178 (H) 01/18/2020     No results found for: GLUCOSE2HR  Lab Results   Component Value Date    A1C 9.0 (H) 06/08/2021    A1C 10.0 (H) 03/09/2021    A1C 10.7 (H) 11/29/2020       Jackqulyn Livings MPH, RD, LDN  Pager: [phone]

## 2021-06-13 NOTE — Unmapped (Signed)
[facility] Refill Coordination Note    Specialty Medication(s) to be Shipped:   CF/Pulmonary: -Trikafta  Other medication(s) to be shipped: No additional medications requested for fill at this time     Christian Young, DOB: [birth year]  Phone: [phone] (home)     All above HIPAA information was verified with patient.     Was a Nurse, learning disability used for this call? No    Completed refill call assessment today to schedule patient's medication shipment from the [facility] [phone]).  All relevant notes have been reviewed.     Specialty medication(s) and dose(s) confirmed: Regimen is correct and unchanged.   Changes to medications: Dat reports no changes at this time.  Changes to insurance: No  New side effects reported not previously addressed with a pharmacist or physician: None reported  Questions for the pharmacist: No    Confirmed patient received a Conservation officer, historic buildings and a Surveyor, mining with first shipment. The patient will receive a drug information handout for each medication shipped and additional FDA Medication Guides as required.       DISEASE/MEDICATION-SPECIFIC INFORMATION        For CF patients: CF [facility] Active? Yes, **HWG VST til 05/06/22**    SPECIALTY MEDICATION ADHERENCE     Medication Adherence    Patient reported X missed doses in the last month: 0  Specialty Medication: Trikafta 100-50-75mg   Patient is on additional specialty medications: No  Patient is on more than two specialty medications: No  Informant: patient  Reliability of informant: reliable  Reasons for non-adherence: no problems identified  Support network for adherence: family member  Confirmed plan for next specialty medication refill: delivery by pharmacy  Refills needed for supportive medications: not needed        Were doses missed due to medication being on hold? No    Trikafta 100-50-75mg  : 7-10 days of medicine on hand     REFERRAL TO PHARMACIST     Referral to the pharmacist: Not needed    [facility]     Shipping address confirmed in Epic.     Delivery Scheduled: Yes, Expected medication delivery date: 06/20/2021.     Medication will be delivered via UPS to the prescription address in Epic WAM.    Christian Young Shared [facility] Specialty Technician

## 2021-06-13 NOTE — Unmapped (Signed)
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 *         1.  Cystic fibrosis pulmonary exacerbation.  He has now grown staph aureus, sensitivities pending and mucoid Pseudomonas aeruginosa, sensitivities reviewed.  Appreciate Pulmonology recommendations, antibiotic and other CF orders in place, continue Zosyn and tobramycin per Pulmonology recs.  Total duration of stay to be determined based upon progress, PFT results.  Anticipate he will need at least 14 days of therapy.  Repeat PFTs on 11/8 to guide duration of therapy.    2.  Essential hypertension.  Continue home regimen.    3.  Mood disorder anxiety.  Continue home fluoxetine and amitriptyline.    4.  CF induced diabetes.  Last hemoglobin A1c 10.  Continue home regimen of insulin, long-term goal of improving A1c.    5.  History of DVT, continue home rivaroxaban.    7.  Pancreatic insufficiency secondary to CF.  Continue supplement enzyme replacement.    Other chronic medical problems reviewed, medications reviewed, stable symptoms other than CF exacerbation.    *DVT prophylaxis: Home anticoagulation.    *Disposition: He will require over 2 midnights in the hospital, total duration of care to be determined, he may require 2 weeks of intravenous antibiotics.      Please page the [facility]) pager at [phone] with questions.      Consultants:   1. Pulmonology      Subjective:     No acute events overnight.No complaint of fever, chills.    Objective:       Vital signs in last 24 hours:  Temp:  [36.6 ??C (97.9 ??F)-37.1 ??C (98.8 ??F)] 36.6 ??C (97.9 ??F)  Heart Rate:  [86-107] 87  Resp:  [16-18] 16  BP: (93-124)/(54-73) 93/54  MAP (mmHg):  [68-92] 68  SpO2:  [93 %-96 %] 94 %    Intake/Output last 24 hours:    Intake/Output Summary (Last 24 hours) at 06/12/2021 1701  Last data filed at 06/12/2021 1500  Gross per 24 hour   Intake 360 ml   Output --   Net 360 ml         Physical Exam:    Gen: Alert, in good spirits.    HEENT: No icterus.    CV: Regular rate and rhythm.    PULM/chest: No adventitial breath sounds appreciated.  Good air exchange bilaterally.    Abd: Robust, soft, Non tender, No palpable organomegaly    Ext: No C/C/E    Neuro: No acute focal deficits.    Skin: Warm, dry.      Medications:   Scheduled Meds:  ??? albuterol  2.5 mg Nebulization TID (RT)   ??? amitriptyline  100 mg Oral Nightly   ??? atorvastatin  20 mg Oral Nightly   ??? cetirizine  10 mg Oral Daily   ??? dornase alfa  2.5 mg Inhalation Daily   ??? elexacaftor-tezacaftor-ivacaft  2 tablet Oral Daily    And   ??? elexacaftor-tezacaftor-ivacaft  1 tablet Oral Q PM   ??? FLUoxetine  60 mg Oral Daily   ??? fluticasone furoate-vilanteroL  1 puff Inhalation Daily (RT)   ??? fluticasone propionate  1 spray Each Nare Daily   ??? gabapentin  600 mg Oral TID   ??? insulin glargine  40 Units Subcutaneous Nightly   ??? insulin lispro  0-20 Units Subcutaneous ACHS   ??? insulin lispro  30 Units Subcutaneous TID AC   ??? lamoTRIgine  200 mg Oral BID   ??? lisinopriL  10 mg Oral Daily   ??? melatonin  10.5 mg Oral Nightly   ??? montelukast  10 mg Oral Nightly   ??? pancrelipase (Lip-Prot-Amyl)  288,000 units of lipase Oral 3xd Meals   ??? pantoprazole  20 mg Oral Daily   ??? MVW Complete (pediatric multivit 61-D3-vit K)  1 capsule Oral BID   ??? phytonadione (vitamin K1)  5 mg Oral Tues,Fri   ??? piperacillin-tazobactam (ZOSYN) IV (intermittent)  4.5 g Intravenous Q6H   ??? polyethylene glycol  17 g Oral Daily   ??? pramipexole  0.25 mg Oral Q PM   ??? rivaroxaban  20 mg Oral Daily   ??? sodium chloride 7%  4 mL Nebulization TID   ??? tobramycin (NEBCIN) IVPB (conventional dosing)  260 mg Intravenous Q12H   ??? traZODone  150 mg Oral Nightly     Continuous Infusions:    Lab Results   Component Value Date    WBC 6.8 06/12/2021    HGB 12.0 (L) 06/12/2021    HCT 35.4 (L) 06/12/2021    PLT 291 06/12/2021       Lab Results   Component Value Date    NA 139 06/12/2021    K 4.8 06/12/2021    CL 101 06/12/2021    CO2 32.3 (H) 06/12/2021    BUN 8 (L) 06/12/2021    CREATININE 0.82 06/12/2021    GLU 210 (H) 06/12/2021    CALCIUM 8.7 06/12/2021    MG 1.9 12/09/2020    PHOS 3.9 08/12/2018       Lab Results   Component Value Date    BILITOT 0.3 06/12/2021    BILIDIR 0.20 07/04/2020    PROT 6.7 06/12/2021    ALBUMIN 3.3 (L) 06/12/2021    ALT 36 06/12/2021    AST 25 06/12/2021    ALKPHOS 89 06/12/2021       Lab Results   Component Value Date    PT 14.3 (H) 06/12/2021    INR 1.25 06/12/2021     Recent Labs     06/12/21  0714   INR 1.25     No results for input(s): OCCULTBLD, RAPSCRN, CDIFRPCR, CDIFFNAP1, A1C in the last 72 hours.    Pending Labs     Order Current Status    AFB culture In process        25 minutes, over 50% spent in counseling and coordination of care.  Tawni Levy MD

## 2021-06-13 NOTE — Unmapped (Signed)
Problem: Adjustment to Illness (Cystic Fibrosis)  Goal: Optimal Coping  Outcome: Ongoing - Unchanged     Problem: Infection (Cystic Fibrosis)  Goal: Absence of Infection Signs and Symptoms  Outcome: Ongoing - Unchanged     Problem: Malabsorption (Cystic Fibrosis)  Goal: Optimal Bowel Elimination  Outcome: Ongoing - Unchanged     Problem: Respiratory Compromise (Cystic Fibrosis)  Goal: Effective Oxygenation and Ventilation  Outcome: Ongoing - Unchanged  Intervention: Promote Airway Secretion Clearance  Recent Flowsheet Documentation  Taken 06/12/2021 2000 by Verdell Face, RRT  Breathing Techniques/Airway Clearance: deep/controlled cough encouraged       All neb tx & airway clearance therapies given as scheduled with no adverse reaction. No respiratory distress noted

## 2021-06-13 NOTE — Unmapped (Signed)
Patient alert and oriented to person, place, time and situation. Continues on IV ABT without s/s of adverse reactions noted at time of this note.  Bed remains in the lowest position, wheels locked and call bell placed within reach  Problem: Adult Inpatient Plan of Care  Goal: Plan of Care Review  Outcome: Progressing  Goal: Patient-Specific Goal (Individualized)  Outcome: Progressing  Goal: Absence of Hospital-Acquired Illness or Injury  Outcome: Progressing  Goal: Optimal Comfort and Wellbeing  Outcome: Progressing  Goal: Readiness for Transition of Care  Outcome: Progressing  Goal: Rounds/Family Conference  Outcome: Progressing     Problem: Infection  Goal: Absence of Infection Signs and Symptoms  Outcome: Progressing     Problem: Adjustment to Illness (Cystic Fibrosis)  Goal: Optimal Coping  Outcome: Progressing     Problem: Infection (Cystic Fibrosis)  Goal: Absence of Infection Signs and Symptoms  Outcome: Progressing     Problem: Oral Intake Inadequate (Cystic Fibrosis)  Goal: Optimal Nutrition Intake  Outcome: Progressing     Problem: Malabsorption (Cystic Fibrosis)  Goal: Optimal Bowel Elimination  Outcome: Progressing     Problem: Respiratory Compromise (Cystic Fibrosis)  Goal: Effective Oxygenation and Ventilation  Outcome: Progressing

## 2021-06-13 NOTE — Unmapped (Signed)
Problem: Adjustment to Illness (Cystic Fibrosis)  Goal: Optimal Coping  Outcome: Ongoing - Unchanged     Problem: Infection (Cystic Fibrosis)  Goal: Absence of Infection Signs and Symptoms  Outcome: Ongoing - Unchanged     Problem: Oral Intake Inadequate (Cystic Fibrosis)  Goal: Optimal Nutrition Intake  Outcome: Ongoing - Unchanged     Problem: Malabsorption (Cystic Fibrosis)  Goal: Optimal Bowel Elimination  Outcome: Ongoing - Unchanged     Problem: Respiratory Compromise (Cystic Fibrosis)  Goal: Effective Oxygenation and Ventilation  Outcome: Ongoing - Unchanged  Intervention: Promote Airway Secretion Clearance  Recent Flowsheet Documentation  Taken 06/12/2021 2000 by Verdell Face, RRT  Breathing Techniques/Airway Clearance: deep/controlled cough encouraged     Problem: Breathing Pattern Ineffective  Goal: Effective Breathing Pattern  Outcome: Ongoing - Unchanged     Problem: Airway Clearance Ineffective  Goal: Effective Airway Clearance  Outcome: Ongoing - Unchanged

## 2021-06-13 NOTE — Unmapped (Signed)
Hospitalist Daily Progress Note     LOS: 5 days       Assessment/Plan:  Principal Problem:    Cystic fibrosis with pulmonary exacerbation (CMS-HCC)  Active Problems:    Essential hypertension    Depressive disorder    Mood disorder (CMS-HCC)    Anxiety    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)    Pancreatic insufficiency due to cystic fibrosis (CMS-HCC)    History of Mycobacterium abscessus infection    Chronic deep vein thrombosis (DVT) of lower extremity (CMS-HCC)    Obesity (BMI 30-39.9)    Restless leg syndrome    Long term current use of anticoagulant therapy  Resolved Problems:    * No resolved hospital problems. *         1.  Cystic fibrosis pulmonary exacerbation.  He has now grown staph aureus, sensitivities pending and mucoid Pseudomonas aeruginosa, sensitivities reviewed.  Appreciate Pulmonology recommendations, antibiotic and other CF orders in place, continue Zosyn and tobramycin per Pulmonology recs.  Total duration of stay to be determined based upon progress, PFT results.  Anticipate he will need at least 14 days of therapy.  Repeat PFTs on 11/8 to guide duration of therapy.    2.  Essential hypertension.  Continue home regimen.    3.  Mood disorder anxiety.  Continue home fluoxetine and amitriptyline.    4.  CF induced diabetes.  Last hemoglobin A1c 10.  Continue home regimen of insulin, long-term goal of improving A1c.    5.  History of DVT, continue home rivaroxaban.  No abnormal bleeding reported.    6..  Pancreatic insufficiency secondary to CF.  Continue supplement enzyme replacement.    7.  Chronic pain, history of back surgery, pain secondary bronchial irritation, he denies any hemoptysis from 7% nebulized saline..  Taper oxycodone, he has been complaining of pain in his upper chest from coughing jags and also some pain in his shoulders.  He has previously been on oxycodone after back surgery within the last 12 months.  -Taper oxycodone to 5 mg every 4 hours as needed, continue to taper down, plan discussed with patient and he agrees.Marland Kitchen  He is not on opiates chronically.    Other chronic medical problems reviewed, medications reviewed, stable symptoms other than CF exacerbation.    *DVT prophylaxis: Home anticoagulation.    *Disposition: He will require over 2 midnights in the hospital, total duration of care to be determined, he may require 2 weeks of intravenous antibiotics.      Please page the [facility]) pager at [phone] with questions.      Consultants:   1.  Pulmonology      Subjective:     No acute events overnight.No complaint of fever, chills.    Objective:       Vital signs in last 24 hours:  Temp:  [36.1 ??C (97 ??F)-36.7 ??C (98 ??F)] 36.7 ??C (98 ??F)  Heart Rate:  [90-102] 90  Resp:  [18] 18  BP: (118-145)/(67-88) 145/88  MAP (mmHg):  [95] 95  SpO2:  [94 %-97 %] 94 %    Intake/Output last 24 hours:  No intake or output data in the 24 hours ending 06/13/21 1649      Physical Exam:    Gen: A bit drowsy, in good spirits.    HEENT: No icterus.    CV: Regular rate and rhythm.    PULM/chest: No adventitial breath sounds appreciated.  Good air exchange bilaterally.  Abd: Robust, soft, Non tender, No palpable organomegaly    Ext: No C/C/E    Neuro: No acute focal deficits.    Skin: Warm, dry.      Medications:   Scheduled Meds:  ??? albuterol  2.5 mg Nebulization TID (RT)   ??? amitriptyline  100 mg Oral Nightly   ??? atorvastatin  20 mg Oral Nightly   ??? cetirizine  10 mg Oral Daily   ??? dornase alfa  2.5 mg Inhalation Daily   ??? elexacaftor-tezacaftor-ivacaft  2 tablet Oral Daily    And   ??? elexacaftor-tezacaftor-ivacaft  1 tablet Oral Q PM   ??? FLUoxetine  60 mg Oral Daily   ??? fluticasone furoate-vilanteroL  1 puff Inhalation Daily (RT)   ??? fluticasone propionate  1 spray Each Nare Daily   ??? gabapentin  600 mg Oral TID   ??? insulin glargine  40 Units Subcutaneous Nightly   ??? insulin lispro  0-20 Units Subcutaneous ACHS   ??? insulin lispro  30 Units Subcutaneous TID AC   ??? lamoTRIgine  200 mg Oral BID   ??? lisinopriL  10 mg Oral Daily   ??? melatonin  10.5 mg Oral Nightly   ??? montelukast  10 mg Oral Nightly   ??? pancrelipase (Lip-Prot-Amyl)  288,000 units of lipase Oral 3xd Meals   ??? pantoprazole  20 mg Oral Daily   ??? MVW Complete (pediatric multivit 61-D3-vit K)  1 capsule Oral BID   ??? phytonadione (vitamin K1)  5 mg Oral Tues,Fri   ??? piperacillin-tazobactam (ZOSYN) IV (intermittent)  4.5 g Intravenous Q6H   ??? polyethylene glycol  17 g Oral Daily   ??? pramipexole  0.25 mg Oral Q PM   ??? rivaroxaban  20 mg Oral Daily   ??? sodium chloride 7%  4 mL Nebulization TID   ??? tobramycin (NEBCIN) IVPB (conventional dosing)  280 mg Intravenous Q12H   ??? traZODone  150 mg Oral Nightly     Continuous Infusions:    Lab Results   Component Value Date    WBC 6.8 06/12/2021    HGB 12.0 (L) 06/12/2021    HCT 35.4 (L) 06/12/2021    PLT 291 06/12/2021       Lab Results   Component Value Date    NA 136 06/13/2021    K 4.0 06/13/2021    CL 99 06/13/2021    CO2 27.8 06/13/2021    BUN 9 06/13/2021    CREATININE 0.70 06/13/2021    GLU 253 (H) 06/13/2021    CALCIUM 8.7 06/13/2021    MG 1.9 12/09/2020    PHOS 3.9 08/12/2018       Lab Results   Component Value Date    BILITOT 0.3 06/12/2021    BILIDIR 0.20 07/04/2020    PROT 6.7 06/12/2021    ALBUMIN 3.3 (L) 06/12/2021    ALT 36 06/12/2021    AST 25 06/12/2021    ALKPHOS 89 06/12/2021       Lab Results   Component Value Date    PT 14.3 (H) 06/12/2021    INR 1.25 06/12/2021     Recent Labs     06/12/21  0714   INR 1.25         Pending Labs     Order Current Status    AFB culture In process        25 minutes, over 50% spent in counseling and coordination of care.  Tawni Levy MD

## 2021-06-13 NOTE — Unmapped (Signed)
Aminoglycoside Therapeutic Monitoring Pharmacy Note    Christian Young is a 35 y.o. male continuing tobramycin. Date of therapy initiation: 06/08/21    Indication: CF exacerbation    Prior Dosing Information: Previous regimen 260 mg IV every 12 hours. Was on same regimen in August 2022.     Goals:  Therapeutic Drug Levels  ?? Trough level: tobramycin <1 mg/L  ?? Peak level: tobramycin 10-14 mg/L    Additional Clinical Monitoring/Outcomes  Renal function, volume status (intake and output)    Results:   ?? Trough level: <0.3 mg/L, drawn 0.5 hours from start of infusion (Extrapolated trough: Undetectable mg/L)   ?? Peak level: 4.7 mg/L, drawn 1.75 hours from end of infusion (Extrapolated Peak: 7.9 mg/L)    Wt Readings from Last 1 Encounters:   06/08/21 (!) 113.4 kg (250 lb)     Lab Results   Component Value Date    CREATININE 0.70 06/13/2021       Pharmacokinetic Considerations and Significant Drug Interactions:  ??? Adult (calculated on 06/10/21): Vd = 26.6 L, ke = 0.26 hr-1  ??? Concurrent nephrotoxic meds: Zosyn    Assessment/Plan:  Recommendation(s)  ??? Change regimen to 280mg  every 12 hours  ??? Estimated peak and trough on recommended regimen: peak = 9.0 mg/L, trough = 0.5 mg/L   ??? Last admission patient was on 260 mg IV every 12 hours and level was lower than goal range but kept due to variable levels on constant regimen. Patient also has a history of tinnitus on tobramycin AIN on Zosyn. Given risk/benefit will continue current dose and titrate up if needed. Continue to monitor renal function closely.     Follow-up  ??? Level due: in 3-5 days  ??? A pharmacist will continue to monitor and order levels as appropriate.    Please page service pharmacist with questions/clarifications.    Cherlynn Polo, PharmD

## 2021-06-13 NOTE — Unmapped (Signed)
PULMONARY CONSULT  NOTE      Patient: Christian Young([birth year])  Reason for consultation: Christian Young is a 35 y.o. male who is seen in consultation at the request of Rica Koyanagi, MD for comprehensive evaluation of Cystic Fibrosis with pulmonary exacerbation.    Assessment and Recommendations:      Principal Problem:    Cystic fibrosis with pulmonary exacerbation (CMS-HCC)  Active Problems:    Essential hypertension    Depressive disorder    Mood disorder (CMS-HCC)    Anxiety    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)    Pancreatic insufficiency due to cystic fibrosis (CMS-HCC)    History of Mycobacterium abscessus infection    Chronic deep vein thrombosis (DVT) of lower extremity (CMS-HCC)    Obesity (BMI 30-39.9)    Restless leg syndrome    Long term current use of anticoagulant therapy  Resolved Problems:    * No resolved hospital problems. *    Unclear trigger of his current exacerbation. FEV1 is down to 62% from most recently nearing 72%.  Overall he feels quite fatigued and is having ongoing sputum production but denies hemoptysis.    Recommendations:  - Continue Zosyn 4.5 mg q6h and IV tobramycin per pharmacy dosing - during last admission was on IV tobramycin 260 mg BID (has a history of AIN on zosyn in the past so needs close renal monitoring). Notably this regimen would not cover stenotrophamonas, so depending on response and updated culture results from admission may need to adjust therapies.  - Follow up CF sputum culture to final: currently growing MSSA and mucoid PSAS;   - Check weekly CRP on Thursdays (ordered). Admission CRP 45.   - CBCd and CMP on Mon and Thurs (ordered)  - PFTs at admission on 10/28 with decline in FEV1 to 62%; plan for repeat PFTs at 7-10 days (Tue 11/8) to guide duration of therapy  - Continue airway clearance: Vest and aerobika at least TID, albuterol at least TID, HTS 7% TID; continue pulmozyme daily, and formulary substitution for home symbicort  - Continue home Trikafta  - 5 mg PO vitamin K twice a week while on IV antibiotics  - continue home enzymes and other multivitamins   - PT consulted for exercise as part of airway clearance  - continue home nasal spray  - Glucose monitoring while admitted, last A1c 10, low threshold to engage endocrine for CFRD if sugars difficult to control during acute exacerbation; agree with current insulin regimen  - ensure regular BMs while admitted with scheduled daily miralax and hold parameters/uptitration as needed as has history of possible SBO/ileus vs DIOS in past    Dispo: Will plan on checking spirometry on Thursday.  If improving, then can go home Friday to complete IV Abx.        We appreciate the opportunity to assist in the care of this patient.  Please page [phone] with any questions.    Verneita Griffes, MD    Subjective:      History of Present Illness:  Christian Young is a 35 y.o. male admitted for Cystic Fibrosis with pulmonary exacerbation.    He has been feeling progressively poorly at home over the past 2 weeks, and particularly since this weekend despite levofloxacin and airway clearance. He reports worsening cough, profound fatigue, chest and rib pain from coughing, poor appetite. Some subjective fever but has been uable to measure. Increased sputum production. He reports that the day befoire coming to  the hospital he passed out after a coughing fit and woke up on the floor. Event was not witnessed but he thinks he hit his head due to waking up with a headache. He reports that this feels typical for CF exacerbations except somewhat worse. Baseline FEV1 ~72% and recent home spirometry showed FEV1 47%. He spoke to his pulmonologist who recommended admission for IV antibiotics.    He reports adherence to his airway clearance as well as Trikafta.  He is unclear of the trigger to this current exacerbation.  He states he feels much more fatigued than his typical exacerbations.  Chest x-ray at admission did not appear significantly changed from priors on my review.    Interval History 06/13/21: No acute events. Fatigue and SOB cont to improve.  Biked 10 min earlier today.         Objective:      Physical Exam:  Vitals:    06/12/21 2016 06/12/21 2145 06/13/21 0737 06/13/21 0912   BP:  128/76 118/67    Pulse: 90 102 94 101   Resp: 18 18 18 18    Temp:  36.2 ??C (97.2 ??F) 36.1 ??C (97 ??F)    TempSrc:       SpO2: 94% 96% 95% 97%   Weight:       Height:         General: No acute distress, appears comfortable  Resting in hospital bed.  Eyes: closed  Lungs: Normal work of breathing.  Cardiovascular: Regular rate and rhythm.  Abdomen: Soft, non-tender, not distended  Musculoskeletal: Mild clubbing of fingers  Skin: No rashes or lesions.  Neuro: Sleeping    Malnutrition Assessment by RD:          Diagnostic Review:   All labs and images were personally reviewed.

## 2021-06-13 NOTE — Unmapped (Signed)
Patient alert and oriented x4.  VSS.  Ambulates independently.  Fingerstick blood glucose monitoring with sliding scale and scheduled coverage.  No complaint of pain or discomfort at this time.  Call light in reach.  Problem: Adult Inpatient Plan of Care  Goal: Plan of Care Review  Outcome: Progressing  Goal: Patient-Specific Goal (Individualized)  Outcome: Progressing  Goal: Absence of Hospital-Acquired Illness or Injury  Outcome: Progressing  Intervention: Identify and Manage Fall Risk  Recent Flowsheet Documentation  Taken 06/13/2021 1200 by Jarrett Soho, RN  Safety Interventions: fall reduction program maintained  Taken 06/13/2021 1000 by Jarrett Soho, RN  Safety Interventions: fall reduction program maintained  Taken 06/13/2021 0800 by Jarrett Soho, RN  Safety Interventions: fall reduction program maintained  Goal: Optimal Comfort and Wellbeing  Outcome: Progressing  Goal: Readiness for Transition of Care  Outcome: Progressing  Goal: Rounds/Family Conference  Outcome: Progressing     Problem: Infection  Goal: Absence of Infection Signs and Symptoms  Outcome: Progressing  Intervention: Prevent or Manage Infection  Recent Flowsheet Documentation  Taken 06/13/2021 1200 by Jarrett Soho, RN  Isolation Precautions: contact precautions maintained  Taken 06/13/2021 1000 by Jarrett Soho, RN  Isolation Precautions: contact precautions maintained     Problem: Adjustment to Illness (Cystic Fibrosis)  Goal: Optimal Coping  Outcome: Progressing     Problem: Infection (Cystic Fibrosis)  Goal: Absence of Infection Signs and Symptoms  Outcome: Progressing  Intervention: Manage Infection and Prevent Transmission  Recent Flowsheet Documentation  Taken 06/13/2021 1200 by Jarrett Soho, RN  Isolation Precautions: contact precautions maintained  Taken 06/13/2021 1000 by Jarrett Soho, RN  Isolation Precautions: contact precautions maintained     Problem: Oral Intake Inadequate (Cystic Fibrosis)  Goal: Optimal Nutrition Intake  Outcome: Progressing     Problem: Malabsorption (Cystic Fibrosis)  Goal: Optimal Bowel Elimination  Outcome: Progressing     Problem: Respiratory Compromise (Cystic Fibrosis)  Goal: Effective Oxygenation and Ventilation  Outcome: Progressing     Problem: Breathing Pattern Ineffective  Goal: Effective Breathing Pattern  Outcome: Progressing     Problem: Airway Clearance Ineffective  Goal: Effective Airway Clearance  Outcome: Progressing

## 2021-06-14 MED ADMIN — pantoprazole (PROTONIX) EC tablet 20 mg: 20 mg | ORAL | @ 13:00:00

## 2021-06-14 MED ADMIN — pediatric multivitamin-vit D3 1,500 unit-vit K 800 mcg (MVW COMPLETE FORMULATION) capsule: 1 | ORAL

## 2021-06-14 MED ADMIN — lamoTRIgine (LaMICtal) tablet 200 mg: 200 mg | ORAL | @ 13:00:00

## 2021-06-14 MED ADMIN — insulin lispro (HumaLOG) injection 30 Units: 30 [IU] | SUBCUTANEOUS | @ 22:00:00

## 2021-06-14 MED ADMIN — pediatric multivitamin-vit D3 1,500 unit-vit K 800 mcg (MVW COMPLETE FORMULATION) capsule: 1 | ORAL | @ 13:00:00

## 2021-06-14 MED ADMIN — FLUoxetine (PROzac) capsule 60 mg: 60 mg | ORAL | @ 13:00:00

## 2021-06-14 MED ADMIN — piperacillin-tazobactam (ZOSYN) IVPB (premix) 4.5 g: 4.5 g | INTRAVENOUS | @ 22:00:00 | Stop: 2021-06-22

## 2021-06-14 MED ADMIN — insulin lispro (HumaLOG) injection 30 Units: 30 [IU] | SUBCUTANEOUS | @ 13:00:00

## 2021-06-14 MED ADMIN — tobramycin (NEBCIN) 280 mg in sodium chloride (NS) 0.9 % 100 mL IVPB: 280 mg | INTRAVENOUS | @ 16:00:00 | Stop: 2021-07-08

## 2021-06-14 MED ADMIN — pancrelipase (Lip-Prot-Amyl) (CREON) 24,000-76,000 -120,000 unit delayed release capsule 288,000 units of lipase: 288000 [IU] | ORAL | @ 22:00:00

## 2021-06-14 MED ADMIN — amitriptyline (ELAVIL) tablet 100 mg: 100 mg | ORAL

## 2021-06-14 MED ADMIN — piperacillin-tazobactam (ZOSYN) IVPB (premix) 4.5 g: 4.5 g | INTRAVENOUS | @ 16:00:00 | Stop: 2021-06-22

## 2021-06-14 MED ADMIN — atorvastatin (LIPITOR) tablet 20 mg: 20 mg | ORAL

## 2021-06-14 MED ADMIN — tobramycin (NEBCIN) 280 mg in sodium chloride (NS) 0.9 % 100 mL IVPB: 280 mg | INTRAVENOUS | @ 03:00:00 | Stop: 2021-07-08

## 2021-06-14 MED ADMIN — fluticasone propionate (FLONASE) 50 mcg/actuation nasal spray 1 spray: 1 | NASAL | @ 13:00:00

## 2021-06-14 MED ADMIN — insulin lispro (HumaLOG) injection 30 Units: 30 [IU] | SUBCUTANEOUS | @ 16:00:00

## 2021-06-14 MED ADMIN — fluticasone furoate-vilanteroL (BREO ELLIPTA) 200-25 mcg/dose inhaler 1 puff: 1 | RESPIRATORY_TRACT | @ 13:00:00

## 2021-06-14 MED ADMIN — insulin lispro (HumaLOG) injection 0-20 Units: 0-20 [IU] | SUBCUTANEOUS | @ 22:00:00

## 2021-06-14 MED ADMIN — insulin glargine (LANTUS) injection 40 Units: 40 [IU] | SUBCUTANEOUS | @ 01:00:00

## 2021-06-14 MED ADMIN — cetirizine (ZyrTEC) tablet 10 mg: 10 mg | ORAL | @ 13:00:00

## 2021-06-14 MED ADMIN — elexacaftor-tezacaftor-ivacaft (TRIKAFTA) tablet 2 ORANGE tablets    **PATIENT SUPPLIED MEDICATION**: 2 | ORAL | @ 13:00:00

## 2021-06-14 MED ADMIN — elexacaftor-tezacaftor-ivacaft (TRIKAFTA) tablet 1 BLUE tablet  **PATIENT SUPPLIED MEDICATION**: 1 | ORAL | @ 22:00:00

## 2021-06-14 MED ADMIN — insulin lispro (HumaLOG) injection 0-20 Units: 0-20 [IU] | SUBCUTANEOUS | @ 16:00:00

## 2021-06-14 MED ADMIN — piperacillin-tazobactam (ZOSYN) IVPB (premix) 4.5 g: 4.5 g | INTRAVENOUS | @ 04:00:00 | Stop: 2021-06-22

## 2021-06-14 MED ADMIN — pancrelipase (Lip-Prot-Amyl) (CREON) 24,000-76,000 -120,000 unit delayed release capsule 288,000 units of lipase: 288000 [IU] | ORAL | @ 16:00:00

## 2021-06-14 MED ADMIN — lisinopriL (PRINIVIL,ZESTRIL) tablet 10 mg: 10 mg | ORAL | @ 13:00:00

## 2021-06-14 MED ADMIN — insulin lispro (HumaLOG) injection 0-20 Units: 0-20 [IU] | SUBCUTANEOUS | @ 01:00:00

## 2021-06-14 MED ADMIN — dornase alfa (PULMOZYME) 1 mg/mL solution 2.5 mg: 2.5 mg | RESPIRATORY_TRACT | @ 13:00:00

## 2021-06-14 MED ADMIN — traZODone (DESYREL) tablet 150 mg: 150 mg | ORAL

## 2021-06-14 MED ADMIN — albuterol 2.5 mg /3 mL (0.083 %) nebulizer solution 2.5 mg: 2.5 mg | RESPIRATORY_TRACT | @ 18:00:00

## 2021-06-14 MED ADMIN — melatonin tablet 10.5 mg: 10 mg | ORAL

## 2021-06-14 MED ADMIN — pramipexole (MIRAPEX) tablet 0.25 mg: .25 mg | ORAL | @ 22:00:00

## 2021-06-14 MED ADMIN — sodium chloride 7% nebulizer solution 4 mL: 4 mL | RESPIRATORY_TRACT | @ 13:00:00

## 2021-06-14 MED ADMIN — sodium chloride 7% nebulizer solution 4 mL: 4 mL | RESPIRATORY_TRACT | @ 18:00:00

## 2021-06-14 MED ADMIN — albuterol 2.5 mg /3 mL (0.083 %) nebulizer solution 2.5 mg: 2.5 mg | RESPIRATORY_TRACT | @ 13:00:00

## 2021-06-14 MED ADMIN — piperacillin-tazobactam (ZOSYN) IVPB (premix) 4.5 g: 4.5 g | INTRAVENOUS | @ 09:00:00 | Stop: 2021-06-22

## 2021-06-14 MED ADMIN — gabapentin (NEURONTIN) capsule 600 mg: 600 mg | ORAL | @ 13:00:00

## 2021-06-14 MED ADMIN — montelukast (SINGULAIR) tablet 10 mg: 10 mg | ORAL

## 2021-06-14 MED ADMIN — pancrelipase (Lip-Prot-Amyl) (CREON) 24,000-76,000 -120,000 unit delayed release capsule 288,000 units of lipase: 288000 [IU] | ORAL | @ 13:00:00

## 2021-06-14 MED ADMIN — rivaroxaban (XARELTO) tablet 20 mg: 20 mg | ORAL | @ 13:00:00

## 2021-06-14 MED ADMIN — oxyCODONE (ROXICODONE) immediate release tablet 5 mg: 5 mg | ORAL | Stop: 2021-06-13

## 2021-06-14 MED ADMIN — lamoTRIgine (LaMICtal) tablet 200 mg: 200 mg | ORAL

## 2021-06-14 MED ADMIN — insulin lispro (HumaLOG) injection 0-20 Units: 0-20 [IU] | SUBCUTANEOUS | @ 13:00:00

## 2021-06-14 MED ADMIN — polyethylene glycol (MIRALAX) packet 17 g: 17 g | ORAL | @ 13:00:00

## 2021-06-14 MED ADMIN — gabapentin (NEURONTIN) capsule 600 mg: 600 mg | ORAL

## 2021-06-14 MED ADMIN — albuterol 2.5 mg /3 mL (0.083 %) nebulizer solution 2.5 mg: 2.5 mg | RESPIRATORY_TRACT

## 2021-06-14 NOTE — Unmapped (Signed)
Patient alert and oriented x4.  VSS.  Ambulates independently.  Takes pills whole.  Receiving IV antibiotics.  Contact precautions maintained.  Continent of bowel and bladder.  Call light in reach.  Problem: Adult Inpatient Plan of Care  Goal: Plan of Care Review  Outcome: Progressing  Goal: Patient-Specific Goal (Individualized)  Outcome: Progressing  Goal: Absence of Hospital-Acquired Illness or Injury  Outcome: Progressing  Intervention: Identify and Manage Fall Risk  Recent Flowsheet Documentation  Taken 06/14/2021 1400 by Jarrett Soho, RN  Safety Interventions: fall reduction program maintained  Taken 06/14/2021 1200 by Jarrett Soho, RN  Safety Interventions: fall reduction program maintained  Goal: Optimal Comfort and Wellbeing  Outcome: Progressing  Goal: Readiness for Transition of Care  Outcome: Progressing  Goal: Rounds/Family Conference  Outcome: Progressing     Problem: Infection  Goal: Absence of Infection Signs and Symptoms  Outcome: Progressing  Intervention: Prevent or Manage Infection  Recent Flowsheet Documentation  Taken 06/14/2021 1400 by Jarrett Soho, RN  Isolation Precautions: contact precautions maintained  Taken 06/14/2021 1200 by Jarrett Soho, RN  Isolation Precautions: contact precautions maintained     Problem: Adjustment to Illness (Cystic Fibrosis)  Goal: Optimal Coping  Outcome: Progressing     Problem: Infection (Cystic Fibrosis)  Goal: Absence of Infection Signs and Symptoms  Outcome: Progressing  Intervention: Manage Infection and Prevent Transmission  Recent Flowsheet Documentation  Taken 06/14/2021 1400 by Jarrett Soho, RN  Isolation Precautions: contact precautions maintained  Taken 06/14/2021 1200 by Jarrett Soho, RN  Isolation Precautions: contact precautions maintained     Problem: Oral Intake Inadequate (Cystic Fibrosis)  Goal: Optimal Nutrition Intake  Outcome: Progressing     Problem: Malabsorption (Cystic Fibrosis)  Goal: Optimal Bowel Elimination  Outcome: Progressing Problem: Respiratory Compromise (Cystic Fibrosis)  Goal: Effective Oxygenation and Ventilation  Outcome: Progressing     Problem: Breathing Pattern Ineffective  Goal: Effective Breathing Pattern  Outcome: Progressing     Problem: Airway Clearance Ineffective  Goal: Effective Airway Clearance  Outcome: Progressing

## 2021-06-14 NOTE — Unmapped (Signed)
Problem: Adjustment to Illness (Cystic Fibrosis)  Goal: Optimal Coping  Outcome: Ongoing - Unchanged     Problem: Infection (Cystic Fibrosis)  Goal: Absence of Infection Signs and Symptoms  Outcome: Ongoing - Unchanged     Problem: Oral Intake Inadequate (Cystic Fibrosis)  Goal: Optimal Nutrition Intake  Outcome: Ongoing - Unchanged     Problem: Malabsorption (Cystic Fibrosis)  Goal: Optimal Bowel Elimination  Outcome: Ongoing - Unchanged     Problem: Respiratory Compromise (Cystic Fibrosis)  Goal: Effective Oxygenation and Ventilation  Outcome: Ongoing - Unchanged  Intervention: Promote Airway Secretion Clearance  Recent Flowsheet Documentation  Taken 06/13/2021 1950 by Verdell Face, RRT  Breathing Techniques/Airway Clearance: deep/controlled cough encouraged     Problem: Breathing Pattern Ineffective  Goal: Effective Breathing Pattern  Outcome: Ongoing - Unchanged  Intervention: Promote Improved Breathing Pattern  Recent Flowsheet Documentation  Taken 06/13/2021 1950 by Verdell Face, RRT  Breathing Techniques/Airway Clearance: deep/controlled cough encouraged     Problem: Airway Clearance Ineffective  Goal: Effective Airway Clearance  Outcome: Ongoing - Unchanged  Intervention: Promote Airway Secretion Clearance  Recent Flowsheet Documentation  Taken 06/13/2021 1950 by Verdell Face, RRT  Breathing Techniques/Airway Clearance: deep/controlled cough encouraged       All neb tx & Airway clearance therapies given as scheduled without adverse reation. No respiratory distress noted

## 2021-06-14 NOTE — Unmapped (Signed)
Patient alert and oriented to person, place, time and situation. Continues on IV ABT without s/s of adverse reactions at time of this note.   Problem: Adult Inpatient Plan of Care  Goal: Plan of Care Review  Outcome: Progressing  Goal: Patient-Specific Goal (Individualized)  Outcome: Progressing  Goal: Absence of Hospital-Acquired Illness or Injury  Outcome: Progressing  Goal: Optimal Comfort and Wellbeing  Outcome: Progressing  Goal: Readiness for Transition of Care  Outcome: Progressing  Goal: Rounds/Family Conference  Outcome: Progressing     Problem: Infection  Goal: Absence of Infection Signs and Symptoms  Outcome: Progressing     Problem: Adjustment to Illness (Cystic Fibrosis)  Goal: Optimal Coping  Outcome: Progressing     Problem: Infection (Cystic Fibrosis)  Goal: Absence of Infection Signs and Symptoms  Outcome: Progressing     Problem: Oral Intake Inadequate (Cystic Fibrosis)  Goal: Optimal Nutrition Intake  Outcome: Progressing     Problem: Malabsorption (Cystic Fibrosis)  Goal: Optimal Bowel Elimination  Outcome: Progressing     Problem: Respiratory Compromise (Cystic Fibrosis)  Goal: Effective Oxygenation and Ventilation  Outcome: Progressing     Problem: Breathing Pattern Ineffective  Goal: Effective Breathing Pattern  Outcome: Progressing     Problem: Airway Clearance Ineffective  Goal: Effective Airway Clearance  Outcome: Progressing

## 2021-06-14 NOTE — Unmapped (Signed)
PULMONARY CONSULT  NOTE      Patient: Christian Young(1986-04-10)  Reason for consultation: Mr. Eicher is a 35 y.o. male who is seen in consultation at the request of Rica Koyanagi, MD for comprehensive evaluation of Cystic Fibrosis with pulmonary exacerbation.    Assessment and Recommendations:      Principal Problem:    Cystic fibrosis with pulmonary exacerbation (CMS-HCC)  Active Problems:    Essential hypertension    Depressive disorder    Mood disorder (CMS-HCC)    Anxiety    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)    Pancreatic insufficiency due to cystic fibrosis (CMS-HCC)    History of Mycobacterium abscessus infection    Chronic deep vein thrombosis (DVT) of lower extremity (CMS-HCC)    Obesity (BMI 30-39.9)    Restless leg syndrome    Long term current use of anticoagulant therapy  Resolved Problems:    * No resolved hospital problems. *    Unclear trigger of his current exacerbation. FEV1 is down to 62% from most recently nearing 72%.  Overall he feels quite fatigued and is having ongoing sputum production but denies hemoptysis.    Recommendations:  - Continue Zosyn 4.5 mg q6h and IV tobramycin per pharmacy dosing - during last admission was on IV tobramycin 260 mg BID (has a history of AIN on zosyn in the past so needs close renal monitoring). Notably this regimen would not cover stenotrophamonas, so depending on response and updated culture results from admission may need to adjust therapies.  - Follow up CF sputum culture to final: currently growing MSSA and mucoid PSAS;   - Check weekly CRP on Thursdays (ordered). Admission CRP 45.   - CBCd and CMP on Mon and Thurs (ordered)  - PFTs at admission on 10/28 with decline in FEV1 to 62%; plan for repeat PFTs at 7-10 days (Tue 11/8) to guide duration of therapy  - Continue airway clearance: Vest and aerobika at least TID, albuterol at least TID, HTS 7% TID; continue pulmozyme daily, and formulary substitution for home symbicort  - Continue home Trikafta  - 5 mg PO vitamin K twice a week while on IV antibiotics  - continue home enzymes and other multivitamins   - PT consulted for exercise as part of airway clearance  - continue home nasal spray  - Glucose monitoring while admitted, last A1c 10, low threshold to engage endocrine for CFRD if sugars difficult to control during acute exacerbation; agree with current insulin regimen  - ensure regular BMs while admitted with scheduled daily miralax and hold parameters/uptitration as needed as has history of possible SBO/ileus vs DIOS in past    Dispo: Will plan on checking spirometry on Thursday.  If improving, then can go home Friday to complete IV Abx.        We appreciate the opportunity to assist in the care of this patient.  Please page [phone] with any questions.    Verneita Griffes, MD    Subjective:      History of Present Illness:  Mr. [name] is a 35 y.o. male admitted for Cystic Fibrosis with pulmonary exacerbation.    He has been feeling progressively poorly at home over the past 2 weeks, and particularly since this weekend despite levofloxacin and airway clearance. He reports worsening cough, profound fatigue, chest and rib pain from coughing, poor appetite. Some subjective fever but has been uable to measure. Increased sputum production. He reports that the day befoire coming to  the hospital he passed out after a coughing fit and woke up on the floor. Event was not witnessed but he thinks he hit his head due to waking up with a headache. He reports that this feels typical for CF exacerbations except somewhat worse. Baseline FEV1 ~72% and recent home spirometry showed FEV1 47%. He spoke to his pulmonologist who recommended admission for IV antibiotics.    He reports adherence to his airway clearance as well as Trikafta.  He is unclear of the trigger to this current exacerbation.  He states he feels much more fatigued than his typical exacerbations.  Chest x-ray at admission did not appear significantly changed from priors on my review.    Interval History 06/14/21: No acute events. Fatigue and SOB cont to improve.  Biked 11 min earlier today.  Overall feeling much better.       Objective:      Physical Exam:  Vitals:    06/13/21 0912 06/13/21 1553 06/13/21 1900 06/14/21 0918   BP:  145/88 129/73 138/79   Pulse: 101 90 101 96   Resp: 18 18 18 18    Temp:  36.7 ??C (98 ??F) 35.4 ??C (95.7 ??F) 36.3 ??C (97.3 ??F)   TempSrc:   Tympanic Oral   SpO2: 97% 94% 91% 94%   Weight:       Height:         General: No acute distress, appears comfortable  Resting in hospital bed.  Eyes: closed  Lungs: Normal work of breathing.  Cardiovascular: Regular rate and rhythm.  Abdomen: Soft, non-tender, not distended  Musculoskeletal: Mild clubbing of fingers  Skin: No rashes or lesions.  Neuro: Sleeping    Malnutrition Assessment by RD:          Diagnostic Review:   All labs and images were personally reviewed.

## 2021-06-15 LAB — COMPREHENSIVE METABOLIC PANEL
ALBUMIN: 3.9 g/dL (ref 3.4–5.0)
ALKALINE PHOSPHATASE: 87 U/L (ref 46–116)
ALT (SGPT): 34 U/L (ref 10–49)
ANION GAP: 7 mmol/L (ref 5–14)
AST (SGOT): 22 U/L (ref <=34)
BILIRUBIN TOTAL: 0.4 mg/dL (ref 0.3–1.2)
BLOOD UREA NITROGEN: 12 mg/dL (ref 9–23)
BUN / CREAT RATIO: 14
CALCIUM: 9.7 mg/dL (ref 8.7–10.4)
CHLORIDE: 98 mmol/L (ref 98–107)
CO2: 29.5 mmol/L (ref 20.0–31.0)
CREATININE: 0.85 mg/dL
EGFR CKD-EPI (2021) MALE: >90 mL/min/{1.73_m2} (ref >=60)
GLUCOSE RANDOM: 205 mg/dL — ABNORMAL HIGH (ref 70–179)
POTASSIUM: 4.5 mmol/L (ref 3.4–4.8)
PROTEIN TOTAL: 7.8 g/dL (ref 5.7–8.2)
SODIUM: 134 mmol/L — ABNORMAL LOW (ref 135–145)

## 2021-06-15 LAB — CBC W/ AUTO DIFF
BASOPHILS ABSOLUTE COUNT: 0.1 10*9/L (ref 0.0–0.1)
BASOPHILS RELATIVE PERCENT: 1 %
EOSINOPHILS ABSOLUTE COUNT: 0.5 10*9/L (ref 0.0–0.5)
EOSINOPHILS RELATIVE PERCENT: 5.9 %
HEMATOCRIT: 39.7 % (ref 39.0–48.0)
HEMOGLOBIN: 13.5 g/dL (ref 12.9–16.5)
LYMPHOCYTES ABSOLUTE COUNT: 1.3 10*9/L (ref 1.1–3.6)
LYMPHOCYTES RELATIVE PERCENT: 15.4 %
MEAN CORPUSCULAR HEMOGLOBIN CONC: 34 g/dL (ref 32.0–36.0)
MEAN CORPUSCULAR HEMOGLOBIN: 27.5 pg (ref 25.9–32.4)
MEAN CORPUSCULAR VOLUME: 80.8 fL (ref 77.6–95.7)
MEAN PLATELET VOLUME: 7.6 fL (ref 6.8–10.7)
MONOCYTES ABSOLUTE COUNT: 0.7 10*9/L (ref 0.3–0.8)
MONOCYTES RELATIVE PERCENT: 8.2 %
NEUTROPHILS ABSOLUTE COUNT: 5.6 10*9/L (ref 1.8–7.8)
NEUTROPHILS RELATIVE PERCENT: 69.5 %
NUCLEATED RED BLOOD CELLS: 0 /100{WBCs} (ref <=4)
PLATELET COUNT: 357 10*9/L (ref 150–450)
RED BLOOD CELL COUNT: 4.91 10*12/L (ref 4.26–5.60)
RED CELL DISTRIBUTION WIDTH: 15.4 % — ABNORMAL HIGH (ref 12.2–15.2)
WBC ADJUSTED: 8.1 10*9/L (ref 3.6–11.2)

## 2021-06-15 LAB — TOBRAMYCIN LEVEL, PEAK: TOBRAMYCIN PEAK: 9.3 ug/mL (ref 4.0–10.0)

## 2021-06-15 LAB — TOBRAMYCIN LEVEL, RANDOM: TOBRAMYCIN RANDOM: 0.7 ug/mL

## 2021-06-15 LAB — C-REACTIVE PROTEIN: C-REACTIVE PROTEIN: 10 mg/L (ref <=10.0)

## 2021-06-15 MED ADMIN — gabapentin (NEURONTIN) capsule 600 mg: 600 mg | ORAL | @ 13:00:00

## 2021-06-15 MED ADMIN — gabapentin (NEURONTIN) capsule 600 mg: 600 mg | ORAL | @ 02:00:00

## 2021-06-15 MED ADMIN — pancrelipase (Lip-Prot-Amyl) (CREON) 24,000-76,000 -120,000 unit delayed release capsule 288,000 units of lipase: 288000 [IU] | ORAL | @ 22:00:00

## 2021-06-15 MED ADMIN — pramipexole (MIRAPEX) tablet 0.25 mg: .25 mg | ORAL | @ 22:00:00

## 2021-06-15 MED ADMIN — albuterol 2.5 mg /3 mL (0.083 %) nebulizer solution 2.5 mg: 2.5 mg | RESPIRATORY_TRACT | @ 18:00:00

## 2021-06-15 MED ADMIN — insulin lispro (HumaLOG) injection 0-20 Units: 0-20 [IU] | SUBCUTANEOUS | @ 16:00:00

## 2021-06-15 MED ADMIN — piperacillin-tazobactam (ZOSYN) IVPB (premix) 4.5 g: 4.5 g | INTRAVENOUS | @ 17:00:00 | Stop: 2021-06-22

## 2021-06-15 MED ADMIN — tobramycin (NEBCIN) 280 mg in sodium chloride (NS) 0.9 % 100 mL IVPB: 280 mg | INTRAVENOUS | @ 02:00:00 | Stop: 2021-07-08

## 2021-06-15 MED ADMIN — tobramycin (NEBCIN) 280 mg in sodium chloride (NS) 0.9 % 100 mL IVPB: 280 mg | INTRAVENOUS | @ 16:00:00 | Stop: 2021-07-08

## 2021-06-15 MED ADMIN — sodium chloride 7% nebulizer solution 4 mL: 4 mL | RESPIRATORY_TRACT | @ 13:00:00

## 2021-06-15 MED ADMIN — lisinopriL (PRINIVIL,ZESTRIL) tablet 10 mg: 10 mg | ORAL | @ 13:00:00

## 2021-06-15 MED ADMIN — oxyCODONE (ROXICODONE) immediate release tablet 5 mg: 5 mg | ORAL | @ 13:00:00 | Stop: 2021-06-22

## 2021-06-15 MED ADMIN — amitriptyline (ELAVIL) tablet 100 mg: 100 mg | ORAL | @ 02:00:00

## 2021-06-15 MED ADMIN — rivaroxaban (XARELTO) tablet 20 mg: 20 mg | ORAL | @ 13:00:00

## 2021-06-15 MED ADMIN — pancrelipase (Lip-Prot-Amyl) (CREON) 24,000-76,000 -120,000 unit delayed release capsule 288,000 units of lipase: 288000 [IU] | ORAL | @ 17:00:00

## 2021-06-15 MED ADMIN — insulin lispro (HumaLOG) injection 30 Units: 30 [IU] | SUBCUTANEOUS | @ 13:00:00

## 2021-06-15 MED ADMIN — insulin lispro (HumaLOG) injection 30 Units: 30 [IU] | SUBCUTANEOUS | @ 22:00:00

## 2021-06-15 MED ADMIN — lamoTRIgine (LaMICtal) tablet 200 mg: 200 mg | ORAL | @ 14:00:00

## 2021-06-15 MED ADMIN — fluticasone furoate-vilanteroL (BREO ELLIPTA) 200-25 mcg/dose inhaler 1 puff: 1 | RESPIRATORY_TRACT | @ 13:00:00

## 2021-06-15 MED ADMIN — insulin lispro (HumaLOG) injection 0-20 Units: 0-20 [IU] | SUBCUTANEOUS | @ 13:00:00

## 2021-06-15 MED ADMIN — pantoprazole (PROTONIX) EC tablet 20 mg: 20 mg | ORAL | @ 13:00:00

## 2021-06-15 MED ADMIN — lamoTRIgine (LaMICtal) tablet 200 mg: 200 mg | ORAL | @ 02:00:00

## 2021-06-15 MED ADMIN — cetirizine (ZyrTEC) tablet 10 mg: 10 mg | ORAL | @ 14:00:00

## 2021-06-15 MED ADMIN — insulin lispro (HumaLOG) injection 30 Units: 30 [IU] | SUBCUTANEOUS | @ 16:00:00

## 2021-06-15 MED ADMIN — oxyCODONE (ROXICODONE) immediate release tablet 5 mg: 5 mg | ORAL | @ 22:00:00 | Stop: 2021-06-22

## 2021-06-15 MED ADMIN — elexacaftor-tezacaftor-ivacaft (TRIKAFTA) tablet 1 BLUE tablet  **PATIENT SUPPLIED MEDICATION**: 1 | ORAL | @ 22:00:00

## 2021-06-15 MED ADMIN — insulin lispro (HumaLOG) injection 0-20 Units: 0-20 [IU] | SUBCUTANEOUS | @ 22:00:00

## 2021-06-15 MED ADMIN — elexacaftor-tezacaftor-ivacaft (TRIKAFTA) tablet 2 ORANGE tablets    **PATIENT SUPPLIED MEDICATION**: 2 | ORAL | @ 13:00:00

## 2021-06-15 MED ADMIN — fluticasone propionate (FLONASE) 50 mcg/actuation nasal spray 1 spray: 1 | NASAL | @ 14:00:00

## 2021-06-15 MED ADMIN — pancrelipase (Lip-Prot-Amyl) (CREON) 24,000-76,000 -120,000 unit delayed release capsule 288,000 units of lipase: 288000 [IU] | ORAL | @ 13:00:00

## 2021-06-15 MED ADMIN — insulin lispro (HumaLOG) injection 0-20 Units: 0-20 [IU] | SUBCUTANEOUS | @ 02:00:00

## 2021-06-15 MED ADMIN — piperacillin-tazobactam (ZOSYN) IVPB (premix) 4.5 g: 4.5 g | INTRAVENOUS | @ 23:00:00 | Stop: 2021-06-22

## 2021-06-15 MED ADMIN — gabapentin (NEURONTIN) capsule 600 mg: 600 mg | ORAL | @ 18:00:00

## 2021-06-15 MED ADMIN — dornase alfa (PULMOZYME) 1 mg/mL solution 2.5 mg: 2.5 mg | RESPIRATORY_TRACT | @ 13:00:00

## 2021-06-15 MED ADMIN — montelukast (SINGULAIR) tablet 10 mg: 10 mg | ORAL | @ 02:00:00

## 2021-06-15 MED ADMIN — pediatric multivitamin-vit D3 1,500 unit-vit K 800 mcg (MVW COMPLETE FORMULATION) capsule: 1 | ORAL | @ 02:00:00

## 2021-06-15 MED ADMIN — polyethylene glycol (MIRALAX) packet 17 g: 17 g | ORAL | @ 13:00:00

## 2021-06-15 MED ADMIN — melatonin tablet 10.5 mg: 10 mg | ORAL | @ 02:00:00

## 2021-06-15 MED ADMIN — piperacillin-tazobactam (ZOSYN) IVPB (premix) 4.5 g: 4.5 g | INTRAVENOUS | @ 04:00:00 | Stop: 2021-06-22

## 2021-06-15 MED ADMIN — traZODone (DESYREL) tablet 150 mg: 150 mg | ORAL | @ 02:00:00

## 2021-06-15 MED ADMIN — atorvastatin (LIPITOR) tablet 20 mg: 20 mg | ORAL | @ 02:00:00

## 2021-06-15 MED ADMIN — sodium chloride 7% nebulizer solution 4 mL: 4 mL | RESPIRATORY_TRACT

## 2021-06-15 MED ADMIN — FLUoxetine (PROzac) capsule 60 mg: 60 mg | ORAL | @ 13:00:00

## 2021-06-15 MED ADMIN — insulin glargine (LANTUS) injection 40 Units: 40 [IU] | SUBCUTANEOUS | @ 02:00:00

## 2021-06-15 MED ADMIN — albuterol 2.5 mg /3 mL (0.083 %) nebulizer solution 2.5 mg: 2.5 mg | RESPIRATORY_TRACT | @ 13:00:00

## 2021-06-15 MED ADMIN — pediatric multivitamin-vit D3 1,500 unit-vit K 800 mcg (MVW COMPLETE FORMULATION) capsule: 1 | ORAL | @ 14:00:00

## 2021-06-15 MED ADMIN — piperacillin-tazobactam (ZOSYN) IVPB (premix) 4.5 g: 4.5 g | INTRAVENOUS | @ 10:00:00 | Stop: 2021-06-22

## 2021-06-15 MED ADMIN — sodium chloride 7% nebulizer solution 4 mL: 4 mL | RESPIRATORY_TRACT | @ 18:00:00

## 2021-06-15 NOTE — Unmapped (Signed)
PULMONARY CONSULT  NOTE      Patient: Christian Young(Mar 16, 1986)  Reason for consultation: Christian Young is a 35 y.o. male who is seen in consultation at the request of Christian Brooklyn, MD for comprehensive evaluation of Cystic Fibrosis with pulmonary exacerbation.    Assessment and Recommendations:      Principal Problem:    Cystic fibrosis with pulmonary exacerbation (CMS-HCC)  Active Problems:    Essential hypertension    Depressive disorder    Mood disorder (CMS-HCC)    Anxiety    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)    Pancreatic insufficiency due to cystic fibrosis (CMS-HCC)    History of Mycobacterium abscessus infection    Chronic deep vein thrombosis (DVT) of lower extremity (CMS-HCC)    Obesity (BMI 30-39.9)    Restless leg syndrome    Long term current use of anticoagulant therapy  Resolved Problems:    * No resolved hospital problems. *    Unclear trigger of his current exacerbation. FEV1 is down to 62% from most recently nearing 72%.  Overall he feels quite fatigued and is having ongoing sputum production but denies hemoptysis.    Recommendations:  - Continue Zosyn 4.5 mg q6h and IV tobramycin per pharmacy dosing - during last admission was on IV tobramycin 260 mg BID (has a history of AIN on zosyn in the past so needs close renal monitoring). Notably this regimen would not cover stenotrophamonas, so depending on response and updated culture results from admission may need to adjust therapies.  - Follow up CF sputum culture to final: currently growing MSSA and mucoid PSAS;   - Check weekly CRP on Thursdays (ordered). Admission CRP 45.  Down to 10 today (11/3)  - CBCd and CMP on Mon and Thurs (ordered)  not remarkable today (11/3)  - PFTs at admission on 10/28 with decline in FEV1 to 62%; plan for repeat PFTs today.  Currently pending.   - Continue airway clearance: Vest and aerobika at least TID, albuterol at least TID, HTS 7% TID; continue pulmozyme daily, and formulary substitution for home symbicort  - Continue home Trikafta  - 5 mg PO vitamin K twice a week while on IV antibiotics  - continue home enzymes and other multivitamins   - PT consulted for exercise as part of airway clearance  - continue home nasal spray  - Glucose monitoring while admitted, last A1c 10, low threshold to engage endocrine for CFRD if sugars difficult to control during acute exacerbation; agree with current insulin regimen  - ensure regular BMs while admitted with scheduled daily miralax and hold parameters/uptitration as needed as has history of possible SBO/ileus vs DIOS in past    Dispo: Will plan on checking spirometry today.  Currently pending.  If improving, then can go home Friday to complete IV Abx.        We appreciate the opportunity to assist in the care of this patient.  Please page [phone] with any questions.    Christian Griffes, MD    Subjective:      History of Present Illness:  Christian Young is a 35 y.o. male admitted for Cystic Fibrosis with pulmonary exacerbation.    He has been feeling progressively poorly at home over the past 2 weeks, and particularly since this weekend despite levofloxacin and airway clearance. He reports worsening cough, profound fatigue, chest and rib pain from coughing, poor appetite. Some subjective fever but has been uable to measure. Increased sputum production. He  reports that the day befoire coming to the hospital he passed out after a coughing fit and woke up on the floor. Event was not witnessed but he thinks he hit his head due to waking up with a headache. He reports that this feels typical for CF exacerbations except somewhat worse. Baseline FEV1 ~72% and recent home spirometry showed FEV1 47%. He spoke to his pulmonologist who recommended admission for IV antibiotics.    He reports adherence to his airway clearance as well as Trikafta.  He is unclear of the trigger to this current exacerbation.  He states he feels much more fatigued than his typical exacerbations.  Chest x-ray at admission did not appear significantly changed from priors on my review.    Interval History 06/15/21: No acute events. Fatigue and SOB cont to improve.  Overall, markedly better. Biked 13 min earlier today.       Objective:      Physical Exam:  Vitals:    06/13/21 1900 06/14/21 0918 06/14/21 1952 06/14/21 2100   BP: 129/73 138/79  123/72   Pulse: 101 96 89 99   Resp: 18 18 18 16    Temp: 35.4 ??C (95.7 ??F) 36.3 ??C (97.3 ??F)  36.6 ??C (97.9 ??F)   TempSrc: Tympanic Oral  Oral   SpO2: 91% 94% 96% 96%   Weight:       Height:         General: No acute distress, appears comfortable  Resting in hospital bed.  Eyes: closed  Lungs: Normal work of breathing.  Cardiovascular: Regular rate and rhythm.  Abdomen: Soft, non-tender, not distended  Musculoskeletal: Mild clubbing of fingers  Skin: No rashes or lesions.  Neuro: Sleeping    Malnutrition Assessment by RD:          Diagnostic Review:   All labs and images were personally reviewed.

## 2021-06-15 NOTE — Unmapped (Signed)
Hospitalist Daily Progress Note     LOS: 6 days       Assessment/Plan:  Principal Problem:    Cystic fibrosis with pulmonary exacerbation (CMS-HCC)  Active Problems:    Essential hypertension    Depressive disorder    Mood disorder (CMS-HCC)    Anxiety    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)    Pancreatic insufficiency due to cystic fibrosis (CMS-HCC)    History of Mycobacterium abscessus infection    Chronic deep vein thrombosis (DVT) of lower extremity (CMS-HCC)    Obesity (BMI 30-39.9)    Restless leg syndrome    Long term current use of anticoagulant therapy  Resolved Problems:    * No resolved hospital problems. *         1.  Cystic fibrosis pulmonary exacerbation.  He has now grown MSSA and mucoid Pseudomonas aeruginosa, sensitivities reviewed.  Appreciate Pulmonology recommendations, antibiotic and other CF orders in place, continue Zosyn and tobramycin per Pulmonology recs.  Total duration of stay to be determined based upon progress, PFT results on 11/8.  Anticipate he will need at least 14 days of therapy but that will be determined based upon his PFT results.  Will remain in the hospital until 11/8, then finish a 14-day course (he was admitted on 10/27) in house or consider completion of another week of necessary at home with home health.  Repeat PFTs on 11/8 to guide duration of therapy.    2.  Essential hypertension.  Continue home regimen.    3.  Mood disorder anxiety.  Continue home fluoxetine and amitriptyline.    4.  CF induced diabetes.  Last hemoglobin A1c 10.  Continue home regimen of insulin, long-term goal of improving A1c.    5.  History of DVT, continue home rivaroxaban.  No abnormal bleeding reported.    6..  Pancreatic insufficiency secondary to CF.  Continue supplement enzyme replacement.    7.  Chronic pain, history of back surgery, pain secondary bronchial irritation, he denies any hemoptysis from 7% nebulized saline..  Taper oxycodone, he has been complaining of pain in his upper chest from coughing jags and also some pain in his shoulders.  He has previously been on oxycodone after back surgery within the last 12 months.  -Taper oxycodone to 5 mg every 4 hours as needed, continue to taper down, plan discussed with patient and he agrees, neck step could be to move to every 6 hours.  He is not on opiates chronically.    Other chronic medical problems reviewed, medications reviewed, stable symptoms other than CF exacerbation.    *DVT prophylaxis: Home anticoagulation.    *Disposition: He will require over 2 midnights in the hospital, total duration of care to be determined, he may require 2 weeks of intravenous antibiotics.      Please page the [facility]) pager at [phone] with questions.      Consultants:   1.  Pulmonology      Subjective:     No acute events overnight. No complaint of fever, chills.  No complaint of breakthrough pain.    Objective:       Vital signs in last 24 hours:  Temp:  [35.4 ??C (95.7 ??F)-36.3 ??C (97.3 ??F)] 36.3 ??C (97.3 ??F)  Heart Rate:  [96-101] 96  Resp:  [18] 18  BP: (129-138)/(73-79) 138/79  MAP (mmHg):  [94] 94  SpO2:  [91 %-94 %] 94 %    Intake/Output last 24 hours:  No intake or output data in the 24 hours ending 06/14/21 1729      Physical Exam:    Gen: A bit drowsy, in good spirits.    HEENT: No icterus.    CV: Regular rate and rhythm.    PULM/chest: No adventitial breath sounds appreciated.  Good air exchange bilaterally.    Abd: Robust, soft, Non tender, No palpable organomegaly    Ext: No C/C/E    Neuro: No acute focal deficits.    Skin: Warm, dry.      Medications:   Scheduled Meds:  ??? albuterol  2.5 mg Nebulization TID (RT)   ??? amitriptyline  100 mg Oral Nightly   ??? atorvastatin  20 mg Oral Nightly   ??? cetirizine  10 mg Oral Daily   ??? dornase alfa  2.5 mg Inhalation Daily   ??? elexacaftor-tezacaftor-ivacaft  2 tablet Oral Daily    And   ??? elexacaftor-tezacaftor-ivacaft  1 tablet Oral Q PM   ??? FLUoxetine  60 mg Oral Daily ??? fluticasone furoate-vilanteroL  1 puff Inhalation Daily (RT)   ??? fluticasone propionate  1 spray Each Nare Daily   ??? gabapentin  600 mg Oral TID   ??? insulin glargine  40 Units Subcutaneous Nightly   ??? insulin lispro  0-20 Units Subcutaneous ACHS   ??? insulin lispro  30 Units Subcutaneous TID AC   ??? lamoTRIgine  200 mg Oral BID   ??? lisinopriL  10 mg Oral Daily   ??? melatonin  10.5 mg Oral Nightly   ??? montelukast  10 mg Oral Nightly   ??? pancrelipase (Lip-Prot-Amyl)  288,000 units of lipase Oral 3xd Meals   ??? pantoprazole  20 mg Oral Daily   ??? MVW Complete (pediatric multivit 61-D3-vit K)  1 capsule Oral BID   ??? phytonadione (vitamin K1)  5 mg Oral Tues,Fri   ??? piperacillin-tazobactam (ZOSYN) IV (intermittent)  4.5 g Intravenous Q6H   ??? polyethylene glycol  17 g Oral Daily   ??? pramipexole  0.25 mg Oral Q PM   ??? rivaroxaban  20 mg Oral Daily   ??? sodium chloride 7%  4 mL Nebulization TID   ??? tobramycin (NEBCIN) IVPB (conventional dosing)  280 mg Intravenous Q12H   ??? traZODone  150 mg Oral Nightly     Continuous Infusions:    Lab Results   Component Value Date    WBC 6.8 06/12/2021    HGB 12.0 (L) 06/12/2021    HCT 35.4 (L) 06/12/2021    PLT 291 06/12/2021       Lab Results   Component Value Date    NA 136 06/13/2021    K 4.0 06/13/2021    CL 99 06/13/2021    CO2 27.8 06/13/2021    BUN 9 06/13/2021    CREATININE 0.70 06/13/2021    GLU 253 (H) 06/13/2021    CALCIUM 8.7 06/13/2021    MG 1.9 12/09/2020    PHOS 3.9 08/12/2018       Lab Results   Component Value Date    BILITOT 0.3 06/12/2021    BILIDIR 0.20 07/04/2020    PROT 6.7 06/12/2021    ALBUMIN 3.3 (L) 06/12/2021    ALT 36 06/12/2021    AST 25 06/12/2021    ALKPHOS 89 06/12/2021       Lab Results   Component Value Date    PT 14.3 (H) 06/12/2021    INR 1.25 06/12/2021     Recent Labs  06/12/21  0714   INR 1.25         Pending Labs     Order Current Status    AFB culture In process        25 minutes, over 50% spent in counseling and coordination of care.  Tawni Levy MD

## 2021-06-15 NOTE — Unmapped (Incomplete)
Primary Team: Please copy and paste below information into home infusion referral:     Disciplines requested: Nursing and Home IV Antibiotics    Physician to follow patient's care (the person listed here will be responsible for signing ongoing orders): Cleotis Lema) [name], MD  Outpatient Pharmacist to follow IV antibiotic therapy: CF Patients (Last Names A-K): Alben Spittle, CPP    Inpatient Start Date of Antibiotics (Day 1): 06/08/21  Start of Care Date (discharge date): 06/16/21  Estimate completion of therapy: 06/21/21  Home health/infusion please reach out to nurse contact (below) 2-3 days prior to end of therapy for appropriate orders  IV Access: Port: Adult Port: Please change Scientist, water quality. Flush with of Normal Saline before and after each use. Use after blood draws. Flush with Heparin 100units/mL 5 mLs at the end of each use and prn line maintenance. Please change needleless cap weekly and after blood draws, using aseptic technique.     IV ANTIBIOTIC ORDERS:  ??? Tobramycin 280mg  IV every 12 hours (Conventional dosing, infuse over 30 minutes)  ??? Piperacillin-tazobactam (Zosyn) 4.5g IV every 6 hours    **DO NOT DRAW DRUG LEVELS FROM PORT**    LAB MONITORING ORDERS:  Start labs on 06/19/21  ??? [Tobramycin (Conventional Dose) + Ceftazidime/Cefepime/Piperacillin-tazobactam/Ceftaroline]: On Mondays each week: CBC + diff, BMP, Magnesium, Tobramycin Trough Level (collect prior to start of infusion time); On Thursdays of each week: BMP, Magnesium, Tobramycin Peak Level (collect 1 hour after END of infusion time)    Collect on MONDAYS of each week:  ??? CBC + diff  ??? Magnesium  ??? Basic Metabolic Panel  ??? Tobramycin Trough Level (collect prior to START of infusion)    Collect on THURSDAYS of each week (only for patients on aminoglycosides)  ??? Magnesium  ??? Basic Metabolic Panel  ??? Tobramycin Peak Level (collect 1-1.5 hrs after END of infusion time)    TARGET DRUG LEVELS:  ??? -Tobramycin (Conventional Dosing): Target end of infusion peak: 10-14 mcg/mL, trough < 1 mcg/mL    --------------------------------------------------------------------------------------  *PLEASE FAX RESULTS TO: [phone]    *PHONE CONTACT*:  ??? Adult CF Patients (Last name A to K): Nurse Coordinator-Nicole Bingham, RN [phone]  ??? For critical lab results or urgent matters outside of regular business hours (Mon through Fri 8:00AM-4:30PM, please call the hospital operator at [phone] and ask to page the pulmonary fellow on-call.    **Please warm hand-off to outpatient team nurse coordinator, pharmacist, and physician per contacts listed above**

## 2021-06-15 NOTE — Unmapped (Addendum)
The patient is a 35 year old Caucasian male with a history of cystic fibrosis who was admitted to the hospital for exacerbation of his underlying lung disease and associated conditions.  He presented to the ER with worsening shortness of breath and fatigue and was managed acutely    1.  CF exacerbation.  The patient grew MSSA and mucoid Pseudomonas aeruginosa.  He was started on a regimen of intravenous tobramycin and Zosyn under the consultative advice of the Pulmonology service.  He subsequently underwent pulmonary function testing on 11/3 to determine the total duration of treatment.  He has previously received 2 weeks of IV antibiotics completed. Pulmonary function testing on 11/3 improvement close to baseline and was discharged the following day on 10 days of Tobramycin and Zosyn.     2.  Tracheal upper airway chest pain from cough.  The patient was complaining of bronchial pain and started on oral oxycodone 10 mg oral every 4 hours and promptly tapered downward.  He had recently been on oxycodone for spinal surgery status postlaminectomy and discectomy but his symptoms resolved he was tapered off oxycodone during a hospitalization last August.    3.  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  His home insulin regimen was continued he was encouraged to stay with a consistent carbohydrate diet. Lantus 40 and 30 TIDAC + SSI. At home,he will continue using his Omnipod. He is also on an ACE inhibitor for hypertension and renal prophylaxis in the setting of type 2 diabetes.  He is not on aspirin but is on statin therapy with atorvastatin.    4.  History of lower extremity DVT.  He is currently on anticoagulation with Xarelto and this was continued uneventfully during his hospitalization.  Duration of therapy to determined on an outpatient basis and follow-up    5.  Cystic fibrosis related pancreatic insufficiency.  His home Creon was continued.    6.  GERD.  Home proton pump inhibition continued.    7. Restless leg syndrome.  Home Mirapex continued.  Symptoms were well controlled.

## 2021-06-15 NOTE — Unmapped (Signed)
Aminoglycoside Therapeutic Monitoring Pharmacy Note    Christian Young is a 35 y.o. male continuing tobramycin. Date of therapy initiation: 06/08/21    Indication: CF exacerbation    Prior Dosing Information: Current regimen 280mg  IV ever 12 hours      Goals:  Therapeutic Drug Levels  ?? Trough level: tobramycin <1 mg/L  ?? Peak level: tobramycin 10-14 mg/L    Additional Clinical Monitoring/Outcomes  Renal function, volume status (intake and output)    Results:   ?? Trough level: 0.7 mg/L, drawn 3.5 hours from start of infusion (Extrapolated trough: 0.2 mg/L)   ?? Peak level: 9.3 mg/L, drawn 0.5 hours after the end of the infusion (Extrapolated Peak: 11.1 mg/L)    Wt Readings from Last 1 Encounters:   06/08/21 (!) 113.4 kg (250 lb)     Lab Results   Component Value Date    CREATININE 0.85 06/15/2021       Pharmacokinetic Considerations and Significant Drug Interactions:  ??? Adult (calculated on 06/15/21): Vd = 23.7 L, ke = 0.34 hr-1  ??? Concurrent nephrotoxic meds: Zosyn    Assessment/Plan:  Recommendation(s)  ??? Change regimen to 280mg  every 12 hours  ??? Estimated peak and trough on recommended regimen: peak = 11 mg/L, trough = <0.5 mg/L   ??? Last admission patient was on 260 mg IV every 12 hours and level was lower than goal range but kept due to variable levels on constant regimen. Patient also has a history of tinnitus on tobramycin AIN on Zosyn. Given risk/benefit will continue current dose and titrate up if needed. Continue to monitor renal function closely.     Follow-up  ??? Level due: in 3-5 days  ??? A pharmacist will continue to monitor and order levels as appropriate.    Please page service pharmacist with questions/clarifications.    Cherlynn Polo, PharmD

## 2021-06-15 NOTE — Unmapped (Signed)
Primary Team: Please copy and paste below information into home infusion referral:     Disciplines requested: Nursing and Home IV Antibiotics    Physician to follow patient's care (the person listed here will be responsible for signing ongoing orders): Cleotis Lema) [name], MD  Outpatient Pharmacist to follow IV antibiotic therapy: CF Patients (Last Names A-K): Alben Spittle, CPP    Inpatient Start Date of Antibiotics (Day 1): 06/08/21  Start of Care Date (discharge date): 06/16/21  Estimate completion of therapy: 06/21/21  Home health/infusion please reach out to nurse contact (below) 2-3 days prior to end of therapy for appropriate orders  IV Access: Port: Adult Port: Please change Scientist, water quality. Flush with of Normal Saline before and after each use. Use after blood draws. Flush with Heparin 100units/mL 5 mLs at the end of each use and prn line maintenance. Please change needleless cap weekly and after blood draws, using aseptic technique.     IV ANTIBIOTIC ORDERS:  ??? Tobramycin 280mg  IV every 12 hours (Conventional dosing, infuse over 30 minutes)  ??? Piperacillin-tazobactam (Zosyn) 4.5g IV every 6 hours    **DO NOT DRAW DRUG LEVELS FROM PORT**    LAB MONITORING ORDERS:  Start labs on 06/19/21  ??? [Tobramycin (Conventional Dose) + Ceftazidime/Cefepime/Piperacillin-tazobactam/Ceftaroline]: On Mondays each week: CBC + diff, BMP, Magnesium, Tobramycin Trough Level (collect prior to start of infusion time); On Thursdays of each week: BMP, Magnesium, Tobramycin Peak Level (collect 1 hour after END of infusion time)    Collect on MONDAYS of each week:  ??? CBC + diff  ??? Magnesium  ??? Basic Metabolic Panel  ??? Tobramycin Trough Level (collect prior to START of infusion)    Collect on THURSDAYS of each week (only for patients on aminoglycosides)  ??? Magnesium  ??? Basic Metabolic Panel  ??? Tobramycin Peak Level (collect 1-1.5 hrs after END of infusion time)    TARGET DRUG LEVELS:  ??? -Tobramycin (Conventional Dosing): Target end of infusion peak: 10-14 mcg/mL, trough < 1 mcg/mL    --------------------------------------------------------------------------------------  *PLEASE FAX RESULTS TO: [phone]    *PHONE CONTACT*:  ??? Adult CF Patients (Last name A to K): Nurse Coordinator-Nicole Bingham, RN [phone]  ??? For critical lab results or urgent matters outside of regular business hours (Mon through Fri 8:00AM-4:30PM, please call the hospital operator at [phone] and ask to page the pulmonary fellow on-call.    **Please warm hand-off to outpatient team nurse coordinator, pharmacist, and physician per contacts listed above**

## 2021-06-15 NOTE — Unmapped (Signed)
Hospital Medicine [facility]) Progress Note    Assessment & Plan:   Christian Young is a 35 y.o. male with a PMHx of Cystic Fibrosis, HTN, CF related DM, Pancreatic insufficiency, DVT (on Xarelto) that presented to [facility] with CF exacerbation. Now markedly improved.     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 *  ??  1.  Cystic fibrosis pulmonary exacerbation  He has grown MSSA and mucoid Pseudomonas aeruginosa, sensitivities reviewed.  Appreciate Pulmonology recommendations, antibiotic and other CF orders in place, continue Zosyn and tobramycin per Pulmonology recs.  Total duration of stay to be determined based upon progress, PFT results on 11/3.   - Repeat PFT 11/3 then consider discharge home 11/4 with 10 more days of abx per pulm recs  - Continue Zosyn and Tobramycin  - appreciate pulm and pharmacy recs  - cont albuterol and dornase  - CBC, CMP  - cont Trikafta  ??  2.  Essential hypertension  Well controlled on home regimen  - cont lisinopril  ??  3.  Mood disorder anxiety.    - Continue home fluoxetine and amitriptyline.  ??  4.  CF induced diabetes  Last hemoglobin A1c 10  - Continue home regimen of insulin, long-term goal of improving A1c.  - Lantus 40u QPM  - Lispro 30u TIDAC  - can use home pump upon discharge home  ??  5.  History of DVT  - continue home rivaroxaban.  No abnormal bleeding reported.  ??  6. Pancreatic insufficiency secondary to CF  - Continue supplement enzyme replacement.  ??  7.  Chronic pain  He is not on opiates chronically.  -Taper oxycodone to 5 mg every 4 hours as needed    Daily Checklist:  Diet: Diabetic Diet  DVT PPx: Patient Already on Full Anticoagulation with Xarelto  Electrolytes: Replete Potassium to >/= 3.6 and Magnesium to >/= 1.8  Code Status: Full Code  Dispo: Home with Home Health and/or PT/OT    Team Contact Information:   Primary Team: Hospital Medicine [facility])  Primary Resident: Linford Arnold, MD  Resident's Pager: [facility] resident pager    Interval History:   No acute events overnight.     Patient reports that he's feeling great and is watching movies on his XBOX one. Denies fever cough or difficulty breathing.     All other systems were reviewed and are negative except as noted in the HPI    Objective:   Temp:  [36.6 ??C (97.9 ??F)] 36.6 ??C (97.9 ??F)  Heart Rate:  [89-99] 99  Resp:  [16-18] 16  BP: (123)/(72) 123/72  SpO2:  [96 %] 96 %    Gen: WDWN male in NAD, eating breakfast, answers questions appropriately  Eyes: sclera anicteric, EOMI  HENT: atraumatic, MMM, OP w/o erythema or exudate   Heart: RRR, S1, S2, no chest wall tenderness  Lungs: CTAB, no crackles or wheezes, no use  of accessory muscles  Abdomen: Normoactive bowel sounds, soft, NTND, no rebound/guarding  Extremities: no clubbing, cyanosis, or edema in the BLEs  Psych: Alert, oriented, appropriate mood and affect    Labs/Studies: Labs and Studies from the last 24hrs per EMR and Reviewed

## 2021-06-15 NOTE — Unmapped (Signed)
Patient alert and oriented to person, place, time and situation. Continues on IV ABT without s/s of adverse reactions at time of this note.   Problem: Adult Inpatient Plan of Care  Goal: Plan of Care Review  Outcome: Progressing  Goal: Patient-Specific Goal (Individualized)  Outcome: Progressing  Goal: Absence of Hospital-Acquired Illness or Injury  Outcome: Progressing  Goal: Optimal Comfort and Wellbeing  Outcome: Progressing  Goal: Readiness for Transition of Care  Outcome: Progressing  Goal: Rounds/Family Conference  Outcome: Progressing     Problem: Infection  Goal: Absence of Infection Signs and Symptoms  Outcome: Progressing     Problem: Adjustment to Illness (Cystic Fibrosis)  Goal: Optimal Coping  Outcome: Progressing     Problem: Infection (Cystic Fibrosis)  Goal: Absence of Infection Signs and Symptoms  Outcome: Progressing     Problem: Oral Intake Inadequate (Cystic Fibrosis)  Goal: Optimal Nutrition Intake  Outcome: Progressing     Problem: Malabsorption (Cystic Fibrosis)  Goal: Optimal Bowel Elimination  Outcome: Progressing     Problem: Respiratory Compromise (Cystic Fibrosis)  Goal: Effective Oxygenation and Ventilation  Outcome: Progressing     Problem: Breathing Pattern Ineffective  Goal: Effective Breathing Pattern  Outcome: Progressing     Problem: Airway Clearance Ineffective  Goal: Effective Airway Clearance  Outcome: Progressing

## 2021-06-16 MED ORDER — INSULIN ASPART (U-100) 100 UNIT/ML (3 ML) SUBCUTANEOUS PEN
Freq: Three times a day (TID) | SUBCUTANEOUS | 0 refills | 30 days | Status: CP
Start: 2021-06-16 — End: 2021-07-16

## 2021-06-16 MED ORDER — LIPASE-PROTEASE-AMYLASE 36,000-114,000-180,000 UNIT CAPSULE,DELAY REL
ORAL_CAPSULE | 11 refills | 0 days | Status: CP
Start: 2021-06-16 — End: ?

## 2021-06-16 MED ADMIN — fluticasone furoate-vilanteroL (BREO ELLIPTA) 200-25 mcg/dose inhaler 1 puff: 1 | RESPIRATORY_TRACT | @ 13:00:00 | Stop: 2021-06-16

## 2021-06-16 MED ADMIN — lisinopriL (PRINIVIL,ZESTRIL) tablet 10 mg: 10 mg | ORAL | @ 13:00:00 | Stop: 2021-06-16

## 2021-06-16 MED ADMIN — piperacillin-tazobactam (ZOSYN) IVPB (premix) 4.5 g: 4.5 g | INTRAVENOUS | @ 05:00:00 | Stop: 2021-06-16

## 2021-06-16 MED ADMIN — albuterol 2.5 mg /3 mL (0.083 %) nebulizer solution 2.5 mg: 2.5 mg | RESPIRATORY_TRACT | @ 13:00:00 | Stop: 2021-06-16

## 2021-06-16 MED ADMIN — lamoTRIgine (LaMICtal) tablet 200 mg: 200 mg | ORAL | @ 01:00:00

## 2021-06-16 MED ADMIN — rivaroxaban (XARELTO) tablet 20 mg: 20 mg | ORAL | @ 13:00:00 | Stop: 2021-06-16

## 2021-06-16 MED ADMIN — melatonin tablet 10.5 mg: 10 mg | ORAL | @ 01:00:00

## 2021-06-16 MED ADMIN — gabapentin (NEURONTIN) capsule 600 mg: 600 mg | ORAL | @ 01:00:00

## 2021-06-16 MED ADMIN — montelukast (SINGULAIR) tablet 10 mg: 10 mg | ORAL | @ 01:00:00

## 2021-06-16 MED ADMIN — pediatric multivitamin-vit D3 1,500 unit-vit K 800 mcg (MVW COMPLETE FORMULATION) capsule: 1 | ORAL | @ 01:00:00

## 2021-06-16 MED ADMIN — insulin lispro (HumaLOG) injection 30 Units: 30 [IU] | SUBCUTANEOUS | @ 13:00:00 | Stop: 2021-06-16

## 2021-06-16 MED ADMIN — dornase alfa (PULMOZYME) 1 mg/mL solution 2.5 mg: 2.5 mg | RESPIRATORY_TRACT | @ 13:00:00 | Stop: 2021-06-16

## 2021-06-16 MED ADMIN — cetirizine (ZyrTEC) tablet 10 mg: 10 mg | ORAL | @ 13:00:00 | Stop: 2021-06-16

## 2021-06-16 MED ADMIN — amitriptyline (ELAVIL) tablet 100 mg: 100 mg | ORAL | @ 01:00:00

## 2021-06-16 MED ADMIN — sodium chloride 7% nebulizer solution 4 mL: 4 mL | RESPIRATORY_TRACT | @ 13:00:00 | Stop: 2021-06-16

## 2021-06-16 MED ADMIN — piperacillin-tazobactam (ZOSYN) IVPB (premix) 4.5 g: 4.5 g | INTRAVENOUS | @ 10:00:00 | Stop: 2021-06-16

## 2021-06-16 MED ADMIN — pediatric multivitamin-vit D3 1,500 unit-vit K 800 mcg (MVW COMPLETE FORMULATION) capsule: 1 | ORAL | @ 13:00:00 | Stop: 2021-06-16

## 2021-06-16 MED ADMIN — insulin glargine (LANTUS) injection 40 Units: 40 [IU] | SUBCUTANEOUS | @ 01:00:00

## 2021-06-16 MED ADMIN — sodium chloride 7% nebulizer solution 4 mL: 4 mL | RESPIRATORY_TRACT | @ 01:00:00

## 2021-06-16 MED ADMIN — lamoTRIgine (LaMICtal) tablet 200 mg: 200 mg | ORAL | @ 13:00:00 | Stop: 2021-06-16

## 2021-06-16 MED ADMIN — gabapentin (NEURONTIN) capsule 600 mg: 600 mg | ORAL | @ 13:00:00 | Stop: 2021-06-16

## 2021-06-16 MED ADMIN — atorvastatin (LIPITOR) tablet 20 mg: 20 mg | ORAL | @ 01:00:00

## 2021-06-16 MED ADMIN — FLUoxetine (PROzac) capsule 60 mg: 60 mg | ORAL | @ 13:00:00 | Stop: 2021-06-16

## 2021-06-16 MED ADMIN — albuterol 2.5 mg /3 mL (0.083 %) nebulizer solution 2.5 mg: 2.5 mg | RESPIRATORY_TRACT | @ 01:00:00

## 2021-06-16 MED ADMIN — phytonadione (vitamin K1) (MEPHYTON) tablet 5 mg: 5 mg | ORAL | @ 13:00:00 | Stop: 2021-06-16

## 2021-06-16 MED ADMIN — polyethylene glycol (MIRALAX) packet 17 g: 17 g | ORAL | @ 13:00:00 | Stop: 2021-06-16

## 2021-06-16 MED ADMIN — insulin lispro (HumaLOG) injection 0-20 Units: 0-20 [IU] | SUBCUTANEOUS | @ 13:00:00 | Stop: 2021-06-16

## 2021-06-16 MED ADMIN — traZODone (DESYREL) tablet 150 mg: 150 mg | ORAL | @ 01:00:00

## 2021-06-16 MED ADMIN — oxyCODONE (ROXICODONE) immediate release tablet 5 mg: 5 mg | ORAL | @ 13:00:00 | Stop: 2021-06-16

## 2021-06-16 MED ADMIN — elexacaftor-tezacaftor-ivacaft (TRIKAFTA) tablet 2 ORANGE tablets    **PATIENT SUPPLIED MEDICATION**: 2 | ORAL | @ 13:00:00 | Stop: 2021-06-16

## 2021-06-16 MED ADMIN — fluticasone propionate (FLONASE) 50 mcg/actuation nasal spray 1 spray: 1 | NASAL | @ 13:00:00 | Stop: 2021-06-16

## 2021-06-16 MED ADMIN — pancrelipase (Lip-Prot-Amyl) (CREON) 24,000-76,000 -120,000 unit delayed release capsule 288,000 units of lipase: 288000 [IU] | ORAL | @ 13:00:00 | Stop: 2021-06-16

## 2021-06-16 MED ADMIN — tobramycin (NEBCIN) 280 mg in sodium chloride (NS) 0.9 % 100 mL IVPB: 280 mg | INTRAVENOUS | @ 03:00:00 | Stop: 2021-07-08

## 2021-06-16 MED ADMIN — pantoprazole (PROTONIX) EC tablet 20 mg: 20 mg | ORAL | @ 13:00:00 | Stop: 2021-06-16

## 2021-06-16 NOTE — Unmapped (Signed)
Patient discharging to home for self care, diabetes education given and understood.     Problem: Adult Inpatient Plan of Care  Goal: Plan of Care Review  Outcome: Progressing  Goal: Patient-Specific Goal (Individualized)  Outcome: Progressing  Goal: Absence of Hospital-Acquired Illness or Injury  Outcome: Progressing  Goal: Optimal Comfort and Wellbeing  Outcome: Progressing  Goal: Readiness for Transition of Care  Outcome: Progressing  Goal: Rounds/Family Conference  Outcome: Progressing     Problem: Infection  Goal: Absence of Infection Signs and Symptoms  Outcome: Progressing     Problem: Adjustment to Illness (Cystic Fibrosis)  Goal: Optimal Coping  Outcome: Progressing     Problem: Infection (Cystic Fibrosis)  Goal: Absence of Infection Signs and Symptoms  Outcome: Progressing     Problem: Oral Intake Inadequate (Cystic Fibrosis)  Goal: Optimal Nutrition Intake  Outcome: Progressing     Problem: Malabsorption (Cystic Fibrosis)  Goal: Optimal Bowel Elimination  Outcome: Progressing     Problem: Respiratory Compromise (Cystic Fibrosis)  Goal: Effective Oxygenation and Ventilation  Outcome: Progressing     Problem: Breathing Pattern Ineffective  Goal: Effective Breathing Pattern  Outcome: Progressing     Problem: Airway Clearance Ineffective  Goal: Effective Airway Clearance  Outcome: Progressing     Problem: Diabetes Comorbidity  Goal: Blood Glucose Level Within Targeted Range  Outcome: Progressing     Problem: Hypertension Comorbidity  Goal: Blood Pressure in Desired Range  Outcome: Progressing

## 2021-06-16 NOTE — Unmapped (Signed)
Patient progressing well, PFT came back great and patient is eager to go home. Took all medication without any adverse reactions. Patient c/o some pain relieved with PRN medication. Will continue to monitor.     Problem: Adult Inpatient Plan of Care  Goal: Plan of Care Review  Outcome: Progressing  Goal: Patient-Specific Goal (Individualized)  Outcome: Progressing  Goal: Absence of Hospital-Acquired Illness or Injury  Outcome: Progressing  Goal: Optimal Comfort and Wellbeing  Outcome: Progressing  Goal: Readiness for Transition of Care  Outcome: Progressing  Goal: Rounds/Family Conference  Outcome: Progressing     Problem: Infection  Goal: Absence of Infection Signs and Symptoms  Outcome: Progressing     Problem: Adjustment to Illness (Cystic Fibrosis)  Goal: Optimal Coping  Outcome: Progressing     Problem: Infection (Cystic Fibrosis)  Goal: Absence of Infection Signs and Symptoms  Outcome: Progressing     Problem: Oral Intake Inadequate (Cystic Fibrosis)  Goal: Optimal Nutrition Intake  Outcome: Progressing     Problem: Malabsorption (Cystic Fibrosis)  Goal: Optimal Bowel Elimination  Outcome: Progressing     Problem: Respiratory Compromise (Cystic Fibrosis)  Goal: Effective Oxygenation and Ventilation  Outcome: Progressing  Intervention: Optimize Oxygenation and Ventilation  Recent Flowsheet Documentation  Taken 06/15/2021 0800 by Jaquelyn Bitter, RN  Head of Bed [facility]) Positioning: HOB elevated     Problem: Breathing Pattern Ineffective  Goal: Effective Breathing Pattern  Outcome: Progressing  Intervention: Promote Improved Breathing Pattern  Recent Flowsheet Documentation  Taken 06/15/2021 0800 by Jaquelyn Bitter, RN  Head of Bed [facility]) Positioning: HOB elevated     Problem: Airway Clearance Ineffective  Goal: Effective Airway Clearance  Outcome: Progressing     Problem: Diabetes Comorbidity  Goal: Blood Glucose Level Within Targeted Range  Outcome: Progressing     Problem: Hypertension Comorbidity  Goal: Blood Pressure in Desired Range  Outcome: Progressing

## 2021-06-16 NOTE — Unmapped (Signed)
Physician Discharge Summary HBR  4 BT1 HBR  [address]  [place] [address]  Dept: [phone]  Loc: [phone]     Identifying Information:   Christian Young  1986-05-18  [id number]    Primary Care Physician: [facility]     Code Status: Full Code    Admit Date: 06/08/2021    Discharge Date: 06/16/2021     Discharge To: Home with Home Health and/or PT/OT    Discharge Service: HBR - HBC: Hospitalist Service #2     Discharge Attending Physician: [name], MD    Discharge Diagnoses:   Principal Problem:    Cystic fibrosis with pulmonary exacerbation (CMS-HCC) POA: Yes  Active Problems:    Essential hypertension POA: Yes    Depressive disorder POA: Yes    Mood disorder (CMS-HCC) POA: Yes    Anxiety POA: Yes    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 [place] Hospital Course:   The patient is a 35 year old Caucasian male with a history of cystic fibrosis who was admitted to the hospital for exacerbation of his underlying lung disease and associated conditions.  He presented to the ER with worsening shortness of breath and fatigue and was managed acutely    1.  CF exacerbation.  The patient grew MSSA and mucoid Pseudomonas aeruginosa.  He was started on a regimen of intravenous tobramycin and Zosyn under the consultative advice of the Pulmonology service.  He subsequently underwent pulmonary function testing on 11/3 to determine the total duration of treatment.  He has previously received 2 weeks of IV antibiotics completed. Pulmonary function testing on 11/3 improvement close to baseline and was discharged the following day on 10 days of Tobramycin and Zosyn.     2.  Tracheal upper airway chest pain from cough.  The patient was complaining of bronchial pain and started on oral oxycodone 10 mg oral every 4 hours and promptly tapered downward.  He had recently been on oxycodone for spinal surgery status postlaminectomy and discectomy but his symptoms resolved he was tapered off oxycodone during a hospitalization last August.    3.  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  His home insulin regimen was continued he was encouraged to stay with a consistent carbohydrate diet. Lantus 40 and 30 TIDAC + SSI. At home,he will continue using his Omnipod. He is also on an ACE inhibitor for hypertension and renal prophylaxis in the setting of type 2 diabetes.  He is not on aspirin but is on statin therapy with atorvastatin.    4.  History of lower extremity DVT.  He is currently on anticoagulation with Xarelto and this was continued uneventfully during his hospitalization.  Duration of therapy to determined on an outpatient basis and follow-up    5.  Cystic fibrosis related pancreatic insufficiency.  His home Creon was continued.    6.  GERD.  Home proton pump inhibition continued.    7.  Restless leg syndrome.  Home Mirapex continued.  Symptoms were well controlled.             Outpatient Provider Follow Up Issues:   [ ]  Tobraymycin levels    Touchbase with Outpatient Provider:  Warm Handoff: Completed on 06/16/21 by Linford Arnold  (Resident) via [facility]    Procedures:  None  ______________________________________________________________________  Discharge Medications:      Your Medication List      START taking these medications    piperacillin-tazobactam 4.5 gram/100 mL Pgbk  Commonly known as: ZOSYN  Infuse 100 mL (4.5 g total) into a venous catheter every six (6) hours for 10 days.     tobramycin 280 mg, OVERFILL 10 mL in sodium chloride 0.9 % 100 mL IVPB  Infuse 280 mg into a venous catheter every twelve (12) hours for 10 days.        CHANGE how you take these medications    fluticasone propionate 50 mcg/actuation nasal spray  Commonly known as: FLONASE  2 sprays into each nostril daily.  What changed:   ?? how much to take  ?? Another medication with the same name was removed. Continue taking this medication, and follow the directions you see here.     insulin ASPART 100 unit/mL injection  Commonly known as: NovoLOG U-100 Insulin aspart  Subcutaneously infuse up to 150 units daily via insulin pump  What changed:   ?? Another medication with the same name was changed. Make sure you understand how and when to take each.  ?? Another medication with the same name was removed. Continue taking this medication, and follow the directions you see here.     insulin ASPART 100 unit/mL (3 mL) injection pen  Commonly known as: NovoLOG FLEXPEN  Inject 0.3 mL (30 Units total) under the skin Three (3) times a day before meals. Plus sliding scale  What changed:   ?? See the new instructions.  ?? Another medication with the same name was removed. Continue taking this medication, and follow the directions you see here.     MVW Complete (pediatric multivit 61-D3-vit K) 1,500-800 unit-mcg Cap  Commonly known as: MVW COMPLETE FORMULATION  Take 2 capsules by mouth Two (2) times a day.  What changed:   ?? how much to take  ?? Another medication with the same name was removed. Continue taking this medication, and follow the directions you see here.     MVW COMPLETE FORMUL PROBIOTIC 40 billion cell -15 mg Cpdr  Generic drug: Lacto-Bif-Sac-Bacil-Strep-bact  Take 1 capsule by mouth daily.  What changed: when to take this        CONTINUE taking these medications    albuterol 90 mcg/actuation inhaler  Commonly known as: PROVENTIL HFA;VENTOLIN HFA  Inhale 2 puffs every six (6) hours as needed.     albuterol 2.5 mg /3 mL (0.083 %) nebulizer solution  Inhale 3 mL (2.5 mg total) by nebulization two (2) times a day.     amitriptyline 100 MG tablet  Commonly known as: ELAVIL  TAKE ONE TABLET BY MOUTH AT BEDTIME     atorvastatin 20 MG tablet  Commonly known as: LIPITOR  Take 20 mg by mouth nightly.     blood-glucose meter kit  Dispense meter that is preferred by patient's insurance company     budesonide-formoteroL 160-4.5 mcg/actuation inhaler  Commonly known as: SYMBICORT  Inhale 2 puffs Two (2) times a day.     cetirizine 10 MG tablet  Commonly known as: ZyrTEC  Take 1 tablet (  10 mg total) by mouth daily.     CREON 36,000-114,000- 180,000 unit Cpdr  Generic drug: lipase-protease-amylase  Take 6 caps by mouth with meals three times daily and 4 caps with snacks up to three times a day.     lipase-protease-amylase 36,000-114,000- 180,000 unit Cpdr  Commonly known as: CREON  Take 8 caps with meals TID and 4 caps with snacks up to three times a day.     DEXCOM G6 RECEIVER Misc  Generic drug: blood-glucose meter,continuous  Use as directed     DEXCOM G6 SENSOR Devi  Generic drug: blood-glucose sensor  Use sq as directed every 10 days     DEXCOM G6 TRANSMITTER Devi  Generic drug: blood-glucose transmitter  Use as directed, replace every 90 days     dornase alfa 1 mg/mL nebulizer solution  Commonly known as: PULMOZYME  Inhale 2.5 mg daily.     EASY TOUCH LANCING DEVICE Misc  Generic drug: lancing device  Use as directed.     EASY TOUCH TWIST LANCETS 30 gauge Misc  Generic drug: lancets  Use as directed.     FLUoxetine 60 mg Tab  Take 60 mg by mouth daily.     FREESTYLE LIBRE 14 DAY SENSOR kit  Generic drug: flash glucose sensor  Use 3-4 times daily     gabapentin 600 MG tablet  Commonly known as: NEURONTIN  Take 600 mg by mouth Three (3) times a day.     glucose blood Strp  Generic drug: blood sugar diagnostic  by Other route Three (3) times a day with a meal. Rx sent to [facility] 01/04/20     lamoTRIgine 200 MG tablet  Commonly known as: LaMICtal  Take 400 mg by mouth daily.     LC PLUS Misc  Generic drug: nebulizers  Use as directed with inhaled medications     lisinopriL 10 MG tablet  Commonly known as: PRINIVIL,ZESTRIL  Take 1 tablet (10 mg total) by mouth daily.     melatonin 10 mg Tab  Take 10 mg by mouth nightly.     montelukast 10 mg tablet  Commonly known as: SINGULAIR  Take 1 tablet (10 mg total) by mouth nightly.     omeprazole 20 MG capsule  Commonly known as: PriLOSEC  Take 1 capsule (20 mg total) by mouth in the morning.     OMNIPOD 5 G6 PODS (GEN 5) Crtg  Generic drug: insulin pump cart,automated,BT  1 each by subcutaneous (via wearable injector) route every three (3) days. Change every 72 hour     oxyCODONE 5 mg capsule  Commonly known as: OXY-IR  Take 5 mg by mouth every four (4) hours as needed for pain. 5-10mg  prn pain (s/p back surgery)     pen needle, diabetic 32 gauge x 5/32 (4 mm) Ndle  Commonly known as: BD ULTRA-FINE NANO PEN NEEDLE  use up to 4 times daily     pramipexole 0.125 MG tablet  Commonly known as: MIRAPEX  Take 0.25 mg by mouth every evening.     sodium chloride 7% 7 % Nebu  Inhale 4 mL by nebulization Two (2) times a day. Increase to 4 times while taking antibiotics     traZODone 150 MG tablet  Commonly known as: DESYREL  Take 150 mg by mouth nightly.     TRIKAFTA 100-50-75 mg(d) /150 mg (n) tablet  Generic drug: elexacaftor-tezacaftor-ivacaft  Take 2 Tablets (orange) by mouth in the morning and 1 tablet (blue) in  the evening with fatty food     XARELTO 20 mg tablet  Generic drug: rivaroxaban  Take 20 mg by mouth daily.            Allergies:  Aztreonam, [place] [aztreonam lysine], Cefepime, Other, Slo-bid 100, Tobramycin, and Banana  ______________________________________________________________________  Pending Test Results:  Pending Labs     Order Current Status    AFB culture In process          Most Recent Labs:  All lab results last 24 hours -   Recent Results (from the past 24 hour(s))   POCT Glucose    Collection Time: 06/15/21 11:27 AM   Result Value Ref Range    Glucose, POC 296 (H) 70 - 179 mg/dL   Tobramycin Level, Peak    Collection Time: 06/15/21 12:59 PM   Result Value Ref Range    Tobramycin Pk 9.3 4.0 - 10.0 ug/mL   Spirometry    Collection Time: 06/15/21  3:56 PM   Result Value Ref Range    FVC PRE 4.32 (L) 4.63 - 6.91 L    FEV1 PRE 3.23 (L) 3.73 - 5.61 L    FEV1/FVC PRE 74.70 71.10 - 90.63 %    FEV6 PRE 4.14 (L) 4.71 - 6.63 L    FEV1/FEV6 PRE 77.96 73.81 - 91.75 %    FEF25-75% PRE 2.38 (L) 2.82 - 6.88 L/s    ISOFEF25-75 PRE 2.38 L/s    FEF50% PRE 3.25 (L) 3.39 - 7.73 L/s    PEF PRE 11.12 8.26 - 13.16 L/s    FET100% Change 10.40 sec    FIVC PRE 4.11 (L) 4.63 - 6.91 L    FIF50% PRED 8.29 L/s    ZOX/WRU04 pre 42.58 %    PIF PRE 8.73 5.96 - 13.08 L/s    Vol extrap pre 0.09 L   POCT Glucose    Collection Time: 06/15/21  4:36 PM   Result Value Ref Range    Glucose, POC 230 (H) 70 - 179 mg/dL   POCT Glucose    Collection Time: 06/15/21  8:14 PM   Result Value Ref Range    Glucose, POC 142 70 - 179 mg/dL   POCT Glucose    Collection Time: 06/16/21  9:02 AM   Result Value Ref Range    Glucose, POC 179 70 - 179 mg/dL       Relevant Studies/Radiology:  No results found.  ______________________________________________________________________  Discharge Instructions:   Activity Instructions     Activity as tolerated                     Follow Up instructions and Outpatient Referrals     Discharge instructions      Referral to Home Infusion      Performing location?: [facility] Requested Disciplines: Nursing    Do you want ongoing co-management?: No    Care coordination required?: No     **Please contact your service pharmacist for assistance with discharge   home health infusion monitoring.          Appointments which have been scheduled for you    Jun 20, 2021 10:00 AM  (Arrive by 9:45 AM)  RETURN PFT 15 with PFT 1  [facility] PULMONARY SPECIALTY FUNCT [place] [facility]) [address]  [place] [address]  [phone]      Jun 20, 2021 10:30 AM  (Arrive by 10:15 AM)  RETURN CYSTIC FIBROSIS with Satira Sark, MD  [facility] PULMONARY SPECIALTY CL [place] [facility] REGION) [address]  [place] [address]  [phone]      Jul 10, 2021  1:00 PM  (Arrive by 12:45 PM)  RETURN  DIABETES with Harrie Foreman, MD  [facility] DIABETES AND ENDOCRINOLOGY [place] [facility] REGION) [address]  [place] [address]  [phone]           ______________________________________________________________________  Discharge Day Services:  BP 126/80  - Pulse 102  - Temp 36.9 ??C (98.4 ??F) (Oral)  - Resp 20  - Ht 182.9 cm (6')  - Wt (!) 113.4 kg (250 lb)  - SpO2 97%  - BMI 33.91 kg/m??     Pt seen on the day of discharge and determined appropriate for discharge.    Condition at Discharge: stable    Length of Discharge: I spent greater than 30 mins in the discharge of this patient.

## 2021-06-16 NOTE — Unmapped (Signed)
Patient alert and oriented to person, place, time and situation. Continues on IV ABT without s/s of adverse reactions at time of this note.  Problem: Adult Inpatient Plan of Care  Goal: Plan of Care Review  Outcome: Progressing  Goal: Patient-Specific Goal (Individualized)  Outcome: Progressing  Goal: Absence of Hospital-Acquired Illness or Injury  Outcome: Progressing  Goal: Optimal Comfort and Wellbeing  Outcome: Progressing  Goal: Readiness for Transition of Care  Outcome: Progressing  Goal: Rounds/Family Conference  Outcome: Progressing     Problem: Infection  Goal: Absence of Infection Signs and Symptoms  Outcome: Progressing     Problem: Adjustment to Illness (Cystic Fibrosis)  Goal: Optimal Coping  Outcome: Progressing     Problem: Infection (Cystic Fibrosis)  Goal: Absence of Infection Signs and Symptoms  Outcome: Progressing     Problem: Oral Intake Inadequate (Cystic Fibrosis)  Goal: Optimal Nutrition Intake  Outcome: Progressing     Problem: Malabsorption (Cystic Fibrosis)  Goal: Optimal Bowel Elimination  Outcome: Progressing     Problem: Respiratory Compromise (Cystic Fibrosis)  Goal: Effective Oxygenation and Ventilation  Outcome: Progressing     Problem: Breathing Pattern Ineffective  Goal: Effective Breathing Pattern  Outcome: Progressing     Problem: Airway Clearance Ineffective  Goal: Effective Airway Clearance  Outcome: Progressing     Problem: Diabetes Comorbidity  Goal: Blood Glucose Level Within Targeted Range  Outcome: Progressing     Problem: Hypertension Comorbidity  Goal: Blood Pressure in Desired Range  Outcome: Progressing

## 2021-06-19 MED FILL — TRIKAFTA 100-50-75 MG (D)/150 MG (N) TABLETS: 28 days supply | Qty: 84 | Fill #5

## 2021-06-19 NOTE — Unmapped (Signed)
Outpatient Lab Results from Monday, November 7th     Outpatient IV Antimicrobial Therapy:  Start Date: 06/08/21  End Date: 06/21/21 (D14)   Line: PORT  Referring pulmonologist: Cleotis Lema) Despotes, MD    IV Antibiotics Ordered:  ??? Tobramycin 280mg  IV every 12 hours (Conventional dosing, infuse over 30 minutes)  ??? Piperacillin-tazobactam (Zosyn) 4.5g IV every 6 hours     06/19/21  Patient was called to discuss start and stop time of Tobramycin on date of drug level collection. Confirmed with patient dosing times of antibiotics and approximate time home health nursing was present to collect labs.     Patient Reported Administration Time:   Start of infusion: 0800   End of infusion:  0830   Lab draw time:      Reviewed with patient:  ??? Urine output: Normal  ??? Flank pain: some aching, not constant, when awake and in the afternoon (bilateral)   ??? Tinnitus/changes in hearing or having imbalance: Yes--Ringing in ear--noticing it more now that related to the tobramycin. related to previous ototoxicity   ??? Rash: Denies  ??? Nausea/Vomiting/Diarrhea: Denies  ??? Any new medications started: now   ??? OTC NSAID use: using Tylenol for pain     PLAN     Spoke with [name]. He received his medications on Friday night around 1030 PM. He was visited by a home health nurse on Friday for admission, but Kaelob stated she was confused about when she need to draw the blood. Ameya thinks she would be coming on Tuesday. He states he has [facility] and [facility].     1:21 PM   Spoke with [place], pharmacist with [facility]. He will follow up with [facility] about having nursing visit this evening or tomorrow.     1. Note forwarded to: Cleotis Lema) [name], MD  2. Lab results forwarded to CPP Leanord Hawking, RN

## 2021-06-19 NOTE — Unmapped (Unsigned)
Pulmonary Clinic - Follow-up Visit    ASSESSMENT     Tanay Misuraca is a 35 y.o. 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     He has been admitted to the hospital twice since last clinic visit in 01/2021 - was associated with COVID-19 infection in August 2022 and received remdesivir with rapid recovery, so got *** day course of antibiotics. Subsequently admitted to [facility] ***, discharged home on IV abx via PICC on 06/16/21 - reports feeling *** with lung function improving but not yet back to baseline (best of FEV1 72% in past year) before discharge home, and now ***    PLAN       CF with pulmonary manifestations, moderate obstruction; history of M Abscessus infection, s/p RUL lobectomy.   - complete *** day course of IV antibiotics for exacerbation  - sputum culture at next visit, AFB culture from 06/08/21 smear neg, culture in process but NGTD  - Continue Trikafta - LFT's annually (last checked 11/2020), along with IgE and other CF labs  - Continue home CF inhaled meds and airway clearance twice daily - increased to QID during exacerbation  - Hold off on restarting TOBI nebs at this time - has not used in quite some time (no prescription since 2020), reports tinnitus and hearing loss  - referred to [facility] previously due to history of sinus disease (never scheduled), and audiology    CF with pancreatic insufficiency, risk for fat-soluble vitamin deficiencies  - Continue Creon and MVW vitamins   - Vitamin A, D, E levels and INR Up to date as of 11/2020    CF chronic rhinosinusitis  - ENT referral previously placed    CFRD  - Patient follows with [facility] for his CF-related diabetes - last A1c 9.0 06/08/21; using Freestyle Libre   - on ACEi  - on Statin, last lipid panel 11/2020    Mood  - Patient following locally with Psychiatry, remains on trazodone    Patient seen and discussed with Dr. Skeet Simmer on ***, follow up in 3 months or sooner as needed    Murlean Iba, MD  [facility] Pediatric Pulmonary Fellow  [facility] Adult Pulmonary and Critical Care Fellow  [phone]    HISTORY:     CF History:  Diagnosed at age 79 with chronic infections, had consolidation and parapneumonic effusion,sent to [facility] for treatment since then.   [id number] and [id number] insertion. 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    Interval HPI 06/20/21  Sonia presents to clinic today for routine CF follow-up after recent Exacerbation    He has been admitted to the hospital twice since last clinic visit in 01/2021 - was associated with COVID-19 infection in 03/13/21-03/22/21 and received remdesivir with rapid recovery, so got 8 day course of antibiotics and 2 days of cipro after discharge (for total of 10 days). Subsequently admitted to [facility] 10/27-11/4/22, discharged home on IV abx via PICC on 06/16/21 on tobramycin and zosyn - reports feeling *** with lung function ***    Required oxycodone for pain associated with cough, weaned off during exacerbation     He currently denies increased cough or sputum, chest pain, wheezing or shortness of breath. He reports using his Albuterol and Hypertonic Saline twice daily and Pulmozyme daily at home.  He has not been on Tobi for a long time now, as he was told to hold off due to tinnitus and hearing loss. His sinuses are doing well, and he continues to use Flonase.     Bowel regimen    He has been doing well from a GI standpoint without abdominal pain, nausea, vomiting, diarrhea, or heartburn. His weight is stable at *** lbs and he would still like to get down to 200 lbs.     He takes his enzymes regularly. He takes 2 MVW D5000 vitamins twice a day and MVW probiotic daily.     He and his wife are living in a house on her sister's property in [place]. They are watching her sister's dog named Link Snuffer, which [name] has enjoyed. [name] has been on FMLA since January due to his back pain and he was let go from [employer] due to this. 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 If he gets it, the training process lasts 6-9 months. His wife works as an Museum/gallery exhibitions officer and is in school to be a Radiation protection practitioner.    Cystic fibrosis issues:  Sinuses: Uses Flonase and takes Claritin and Singulair. No congestion, drainage or pressure currently. Referred to [facility]NT ***    Airway clearance: Does albuterol, and 7% hypertonic saline twice a day.  Reports compliance to it.  Does Pulmozyme daily.  Uses the vest occasionally.  Hasn't used Tobi in months - does have history of tinnitus with it    GI and nutrition: Denies any constipation or diarrhea. On Miralax ***  Reports compliance to his Creon.  Has lost 30-40 lbs in the past 1-2 years.     Hypertension: On lisinopril 10 mg daily (See discussin of CFRD). Was on Coreg before, which was started in 2018 as an inpatient for sinus tachycardia.     Diabetes: Followed and managed by endocrinology.  Reports compliance to his insulin. Achieved significant weight loss, and was able to decrease insulin dosing  Lantus 40 and 30 TIDAC + SSI during recent hospital stay, since discharge using his Omnipod. He is also on an ACE inhibitor for hypertension and renal prophylaxis    Mental health issues:   - Following with local Psychiatrist. Trazodone increased from 100 mg to 150 mg nightly.     Past Medical History:   Diagnosis Date   ??? Anxiety    ??? Chronic pain disorder    ??? Cystic fibrosis (CMS-HCC)    ??? Depression    ??? Hypertension    ??? Lumbar radiculopathy 10/26/2020   ??? Nonproductive cough 04/05/2018     Past Surgical History:   Procedure Laterality Date   ??? IR INSERT PORT AGE GREATER THAN 5 YRS  03/27/2019    IR INSERT PORT AGE GREATER THAN 5 YRS 03/27/2019 Rush Barer, MD IMG VIR [facility]   ??? PR REMOVAL OF LUNG,LOBECTOMY Right 03/29/2017    Procedure: REMOVAL OF LUNG, OTHER THAN PNEUMONECTOMY; SINGLE LOBE (LOBECTOMY);  Surgeon: Cherie Dark, MD;  Location: MAIN [facility];  Service: Thoracic       Other History:  The social history and family history were personally reviewed and updated in the patient's electronic medical record.     Home Medications:  Current Outpatient Medications on File Prior to Visit   Medication Sig Dispense Refill   ??? albuterol (PROVENTIL HFA;VENTOLIN HFA) 90 mcg/actuation inhaler Inhale 2 puffs every six (6) hours as needed. 1 Inhaler 1   ??? albuterol 2.5 mg /3  mL (0.083 %) nebulizer solution Inhale 3 mL (2.5 mg total) by nebulization two (2) times a day. 540 mL 3   ??? amitriptyline (ELAVIL) 100 MG tablet TAKE ONE TABLET BY MOUTH AT BEDTIME (Patient taking differently: No sig reported) 30 tablet 1   ??? atorvastatin (LIPITOR) 20 MG tablet Take 20 mg by mouth nightly.     ??? blood sugar diagnostic (GLUCOSE BLOOD) Strp by Other route Three (3) times a day with a meal. Rx sent to [facility] 01/04/20     ??? blood-glucose meter kit Dispense meter that is preferred by patient's insurance company 1 each 0   ??? blood-glucose meter,continuous (DEXCOM G6 RECEIVER) Misc Use as directed 1 each 0   ??? blood-glucose sensor (DEXCOM G6 SENSOR) Devi Use sq as directed every 10 days 9 each 11   ??? blood-glucose transmitter (DEXCOM G6 TRANSMITTER) Devi Use as directed, replace every 90 days 1 each 11   ??? budesonide-formoteroL (SYMBICORT) 160-4.5 mcg/actuation inhaler Inhale 2 puffs Two (2) times a day. 30.6 g 3   ??? cetirizine (ZYRTEC) 10 MG tablet Take 1 tablet (10 mg total) by mouth daily. 30 tablet 11   ??? dornase alfa (PULMOZYME) 1 mg/mL nebulizer solution Inhale 2.5 mg daily. 450 mL 3   ??? EASY TOUCH LANCING DEVICE Misc Use as directed.     ??? EASY TOUCH TWIST LANCETS 30 gauge Misc Use as directed.     ??? elexacaftor-tezacaftor-ivacaft (TRIKAFTA) 100-50-75 mg(d) /150 mg (n) tablet Take 2 Tablets (orange) by mouth in the morning and 1 tablet (blue) in the evening with fatty food 84 tablet 6   ??? FLUoxetine 60 mg Tab Take 60 mg by mouth daily.      ??? fluticasone propionate (FLONASE) 50 mcg/actuation nasal spray 2 sprays into each nostril daily.     ??? FREESTYLE LIBRE 14 DAY SENSOR kit Use 3-4 times daily     ??? gabapentin (NEURONTIN) 600 MG tablet Take 600 mg by mouth Three (3) times a day.     ??? insulin ASPART (NOVOLOG FLEXPEN) 100 unit/mL (3 mL) injection pen Inject 0.3 mL (30 Units total) under the skin Three (3) times a day before meals. Plus sliding scale 27 mL 0   ??? insulin ASPART (NOVOLOG U-100 INSULIN ASPART) 100 unit/mL injection Subcutaneously infuse up to 150 units daily via insulin pump 150 mL 11   ??? insulin pump cart,automated,BT (OMNIPOD 5 G6 PODS, GEN 5,) Crtg 1 each by subcutaneous (via wearable injector) route every three (3) days. Change every 72 hour 10 each 12   ??? lamoTRIgine (LAMICTAL) 200 MG tablet Take 400 mg by mouth daily.     ??? lipase-protease-amylase (CREON) 36,000-114,000- 180,000 unit CpDR Take 6 caps by mouth with meals three times daily and 4 caps with snacks up to three times a day.     ??? lipase-protease-amylase (CREON) 36,000-114,000- 180,000 unit CpDR Take 8 caps with meals TID and 4 caps with snacks up to three times a day. 1200 capsule 11   ??? lisinopriL (PRINIVIL,ZESTRIL) 10 MG tablet Take 1 tablet (10 mg total) by mouth daily. 30 tablet 0   ??? melatonin 10 mg Tab Take 10 mg by mouth nightly.     ??? montelukast (SINGULAIR) 10 mg tablet Take 1 tablet (10 mg total) by mouth nightly. 90 tablet 3   ??? MVW COMPLETE FORMUL PROBIOTIC 40 billion cell -15 mg CpDR Take 1 capsule by mouth daily. (Patient taking differently: Take 1 capsule by mouth daily.) 90 capsule  3   ??? MVW Complete, pediatric multivit 61-D3-vit K, (MVW COMPLETE FORMULATION) 1,500-800 unit-mcg cap Take 2 capsules by mouth Two (2) times a day.     ??? nebulizers Misc Use as directed with inhaled medications 4 each 3   ??? omeprazole (PRILOSEC) 20 MG capsule Take 1 capsule (20 mg total) by mouth in the morning. 90 capsule 3   ??? oxyCODONE (OXY-IR) 5 mg capsule Take 5 mg by mouth every four (4) hours as needed for pain. 5-10mg  prn pain (s/p back surgery) (Patient not taking: Reported on 06/08/2021)     ??? pen needle, diabetic (BD ULTRA-FINE NANO PEN NEEDLE) 32 gauge x 5/32 (4 mm) Ndle use up to 4 times daily 400 each 12   ??? piperacillin-tazobactam (ZOSYN) 4.5 gram/100 mL PgBk Infuse 100 mL (4.5 g total) into a venous catheter every six (6) hours for 10 days. 4000 mL 0   ??? pramipexole (MIRAPEX) 0.125 MG tablet Take 0.25 mg by mouth every evening.      ??? sodium chloride 7% 7 % Nebu Inhale 4 mL by nebulization Two (2) times a day. Increase to 4 times while taking antibiotics     ??? tobramycin 280 mg, OVERFILL 10 mL in sodium chloride 0.9 % 100 mL IVPB Infuse 280 mg into a venous catheter every twelve (12) hours for 10 days. 1 each 0   ??? traZODone (DESYREL) 150 MG tablet Take 150 mg by mouth nightly.     ??? XARELTO 20 mg tablet Take 20 mg by mouth daily.       No current facility-administered medications on file prior to visit.        Allergies:  Allergies as of 06/20/2021 - Reviewed 06/17/2021   Allergen Reaction Noted   ??? Aztreonam Anaphylaxis, Hives, Nausea And Vomiting, and Rash 09/06/2015   ??? [place] [aztreonam lysine] Anaphylaxis 12/27/2016   ??? Cefepime Itching, Nausea Only, and Other (See Comments) 09/06/2015   ??? Other Anaphylaxis and Other (See Comments) 09/06/2015   ??? Slo-bid 100 Anaphylaxis 06/20/2017   ??? Tobramycin Tinnitus 09/06/2015   ??? Banana Itching and Nausea And Vomiting 07/29/2016     Genetics:  [id number] and [id number] insertion  Modulator therapy: On ETI  ??  Airway Clearance Regimen:  HTS BID  Pulmozyme  ??  Inhaled ABX:  Off TOBI due to tinnitus/hearing loss   ??  Hemoptysis:   No  ABPA:             No  Ptx:                  No  Sinusitus:       Yes  ??  Panc Insuf:     Yes  PEG:                No  DIOS:              No bowel issues  CF Liver Dz:   No  ??  Diabetes:        Uncontrolled - follows w/ endocrine  Osteopenia: N/A  ??  Depression:   Yes  ??  Other Co-morbidities:  Herniated disc with lumbar radiculopathy s/p decompression surgery  History of DVT, on anticoagulation  ??  Social Setting:  Lives with wife in [place]    Exacerbation history:  11/2020 - 14 days of tobramycin and zosyn   03/2021 - COVID+, remdesivir x 3 days, tobra and zosyn x 8 days, discharged on 2 more days of  cipro  05/2021 - started on cipro, hospitalized, sent home on IV abx to complete *** days of tobra and zosyn    Micro History:    MAC 11/20/18??    MABSC  - Assumed S to erythro from [facility]  Last Negative Cx: 03/14/21, culture from 06/08/21 pending  06/2017: MASBC not present, but M gordonae is   First Negative Cx: 03/2017  Last Positive: 02/21/17  ??  MSSA (06/08/21)    Smooth PsA (06/08/21)  Prior resistance testing on 02/06/21   Mucoid Pseudomonas aeruginosa Smooth Pseudomonas aeruginosa     KIRBY BAUER KIRBY BAUER     Aztreonam Susceptible Susceptible     Ceftazidime Susceptible Susceptible     Ciprofloxacin Intermediate Resistant     Imipenem Resistant Resistant     Levofloxacin Intermediate Resistant     Meropenem Intermediate Resistant     Piperacillin + Tazobactam Susceptible Susceptible     Tobramycin Susceptible Susceptible             ??    Stenotrophomonas (03/14/21)  R: Ceftat in past; Pan-S on this culture  ??    ????  Review of Systems:  A comprehensive review of systems was completed and negative except as noted in HPI.    PHYSICAL EXAM:     There were no vitals filed for this visit.  There is no height or weight on file to calculate BMI.  Wt Readings from Last 3 Encounters:   06/08/21 (!) 113.4 kg (250 lb)   03/14/21 (!) 111.2 kg (245 lb 1.6 oz)   03/09/21 (!) 109 kg (240 lb 3.2 oz)     GEN: Cooperative male, sitting up on exam table, NAD  HEENT: NCAT, EOMI, sclerae anicteric  NECK: Supple, trachea midline  LYMPH: No palpable lymphadenopathy   CHEST: Port-a-cath present in left upper chest wall  HEART/CV: RRR, S1, S2 nl, no MRG  LUNGS/RESP: CTA bilaterally, no crackles or wheezes, normal WOB on RA  ABD/GI: NABS, soft, NT/ND, no rebound or guarding, no masses, no hepatomegaly noted   EXT: No cyanosis, clubbing, or edema  SKIN: No rashes or lesions   NEURO: No focal deficits noted  PSYCH: Awake, alert, and interactive. Mood and affect appropriate.     LABORATORY and RADIOLOGY DATA:     Pulmonary Function Tests:        Date: FEV1(% Pred) FVC (% Pred) FEV1/FVC FEF25-75(% Pred) Other Results   06/09/21 2.92 62.4%  3.84 66.7%  2.25 48.6%    06/15/2021 3.23 68.8 4.32 74.9  2.38 51.4        Interpretation: Spirometry demonstrates moderate obstruction - improving but not yet back to best in past year (FEV1 72% in 12/01/20)    Pertinent Laboratory Data:  CF Sputum Culture   Date Value Ref Range Status   06/08/2021 2+ Methicillin-Susceptible Staphylococcus aureus (A)  Final   06/08/2021 <1+ Mucoid Pseudomonas aeruginosa (A)  Final   06/08/2021 2+ Oropharyngeal Flora Isolated  Final        AFB Smear   Date Value Ref Range Status   06/08/2021  No Acid Fast Bacilli Seen Final    NO ACID FAST BACILLI SEEN- 3 negative smears do not exclude pulmonary TB. If active pulmonary TB is suspected, continue airborne isolation until pulmonary disease is excluded by negative cultures.     AFB Culture   Date Value Ref Range Status   03/14/2021 No Acid Fast Bacilli Detected  Final        IgE,  Total   Date Value Ref Range Status   02/06/2021 18.8 2-214 IU/mL IU/mL Final        Total Bilirubin   Date Value Ref Range Status   06/15/2021 0.4 0.3 - 1.2 mg/dL Final     AST   Date Value Ref Range Status   06/15/2021 22 <=34 U/L Final     ALT   Date Value Ref Range Status   06/15/2021 34 10 - 49 U/L Final     Alkaline Phosphatase   Date Value Ref Range Status   06/15/2021 87 46 - 116 U/L Final        INR   Date Value Ref Range Status   06/12/2021 1.25  Final        Hemoglobin A1C   Date Value Ref Range Status   06/08/2021 9.0 (H) 4.8 - 5.6 % Final     Estimated Average Glucose   Date Value Ref Range Status   06/08/2021 212 mg/dL Final        Vitamin A   Date Value Ref Range Status   02/06/2021 58.7 32.5 - 78.0 mcg/dL Final     Comment:        -------------------ADDITIONAL INFORMATION-------------------  This test was developed and its performance characteristics   determined by [facility] in a manner consistent with CLIA   requirements. This test has not been cleared or approved by   the U.S. Food and Drug Administration.     Test Performed by:  [facility]  [address] [place], [place], [place] [address]  Lab Director: Paul Dykes M.D. Ph.D.; CLIA# [id number]     Vitamin E   Date Value Ref Range Status   02/06/2021 16.0 5.5 - 17.0 mg/L Final     Comment:        -------------------ADDITIONAL INFORMATION-------------------  This test was developed and its performance characteristics   determined by [facility] in a manner consistent with CLIA   requirements. This test has not been cleared or approved by   the U.S. Food and Drug Administration.     Test Performed by:  [facility]  [address] [place], [place], [place] [address]  Lab Director: Paul Dykes M.D. Ph.D.; CLIA# [id number]     Vitamin D Total (25OH)   Date Value Ref Range Status   02/06/2021 38.2 20.0 - 80.0 ng/mL Final        Lipids 12/05/20:  Total Cholesterol 148, TGL 80 (unknown if fasting), HDL 36, LDL 96      Imaging:  CXR 95/28/41  FINDINGS:   Left chest wall Port-A-Cath with tip projecting over the superior cavoatrial junction.  ??  Small right pleural effusion, unchanged. Increasing right basilar opacity. Linear right apical opacity, unchanged.  ??  IMPRESSION:  Small right pleural effusion with adjacent opacity, likely representing atelectasis, although superimposed infection cannot be entirely excluded.     CT Head 06/08/21  FINDINGS:   There is no midline shift. No mass lesion. There is no evidence of acute infarct. No acute intracranial hemorrhage. No fractures are evident. There are postsurgical changes from bilateral maxillary antrostomies with mucosal thickening involving bilateral sphenoid sinuses as well as scattered ethmoid air cells.  ??  IMPRESSION:  No acute or post traumatic intracranial findings.    CT Chest 06/24/2018  FINDINGS:   Right lower lobe and inferior lingular bronchiectasis. Right lower lobe peripheral mucoid impacted airways and tree-in-bud nodular opacities unchanged.  Gas trapping present all lung lobes, but most severe in the right lower lobe.   ??  Right upper lobectomy with stable postoperative changes. No consolidation.   ??  No pleural effusion.  ??  Right-sided Port-A-Cath terminates in the right atrium. Heart size normal. No pericardial effusion. Normal caliber thoracic aorta. No mediastinal lymphadenopathy.  ??  Bilateral gynecomastia. Hepatic steatosis. Osseous structures unremarkable.  ??  IMPRESSION:  Chronic small airways disease including gas trapping and peripheral mucoid impacted airways unchanged compared 12/29/2017.

## 2021-06-20 DIAGNOSIS — Z792 Long term (current) use of antibiotics: Principal | ICD-10-CM

## 2021-06-20 NOTE — Unmapped (Signed)
Addended by: Cicero Duck on: 06/20/2021 03:41 PM     Modules accepted: Orders

## 2021-06-20 NOTE — Unmapped (Signed)
Pharmacy agreeable to having a BMP completed. Order for [facility] placed. Messaged through [facility] of this request/lab order.     06/20/21 3:39 PM  Reached out [facility] that Wilder has discontinued his IV antibiotc therapy this morning @ 0830, but he should remain on port care supplies if he receives port care from [facility].    [name]. [name], RN  CF Nurse Coordinator   [phone]

## 2021-06-20 NOTE — Unmapped (Unsigned)
Patient did not get BMP after completing IV antibiotics. Note deleted. extended}  ??? {OUTPATIENT LAB ORDERS:58871}    {Blank single:19197::Due on ***,No further follow up labs were ordered as patient due to complete day *** of IV antibiotics on ***, will order labs if therapy extended}  ??? {OUTPATIENT LAB ORDERS:58871}    Orders above were communicated to {HOME INFUSION COMPANIES:82332} on *** at ***.     Electronically signed:  ***    Consult note routed back to:   ??? {CF PROVIDERS:58867}  ??? {NURSE AND HOME INFUSION CONTACTS:88120}

## 2021-06-20 NOTE — Unmapped (Signed)
Christian Young left a message with this Financial planner. He notes his wife was surprised to learn she had school today and is unable to come to clinic today. He and his wife are very sorry that they are having to reschedule. He also noted his port site is not doing well and his skin is needing to take a break.     Returned call to Christian Young. He infused his tobramycin this morning ([id number]) and has since de-accessed his port and ended his IV antibiotic regimen. Christian Young also sent a picture of the irritated site. He feels it was exacerbated by using Tegaderm and was not sent his requested IV3000. He is agreeable to being rescheduled to January 2023.     He also notes he did not hear from [facility] for setting up of lab draw last night or this morning.  He was admitted by nursing on Friday at [facility], but no organization or communication to set up lab draw. Christian Young to decline a visit from home health, he is agreeable to go to [facility] for labs if we wish. He will also perform home spirometry per this writer's request. Christian Young is ready to be done with IV antibiotic therapy. He reports his cough and sputum production is back at baseline.      Update routed to Dr [name].     Christian Flake [name], RN  CF Nurse Coordinator   [phone]

## 2021-06-28 NOTE — Unmapped (Signed)
[facility]    Home Spirometry Monitoring      06/28/21        I have reviewed 1 home spirometry test(s) over prior calendar month.  Most recent test demonstrates severe obstruction and moderate restriction.  Compared to prior home spirometry values, the most recent test is worse. Best in-clinic FEV1 in last year was 3.42 L (73%) on 12/01/20.             Quality of most recent test graded B.     Follow-up plan:   ??? Reached out to patient via MyChart. He was recently admitted to [facility] for IV antibiotics, and his FEV1 improved to 3.23 L (69%) on 06/15/21.   ??? Continue monthly home spirometry monitoring for the management of cystic fibrosis.    Durenda Hurt [place], PA-C  [facility]   [phone]

## 2021-07-06 NOTE — Unmapped (Unsigned)
Chief Complaint:     35 y.o. male with a PMH of CF, HTN w CFRD.   No chief complaint on file.    Subjective:     HPI   CFRD dx'd ~2/19.     Last seen by myself 03/09/2021  At last visit:   ??? Blood sugars still a little high over night might try more Basaglar 55 or 60 unis  ??? Coverage for dinner w Novolog and prebolus  ??? Urine albumin test today  ??? CDE for pump  ??? Dexcom to [facility]  Since last visit:   Interim visits w Ms Clovis Riley, CDE-->     Currently taking the following for CFRD:       Basaglar Lantus insulin 50 units qhs  Aspart insulin ~30 units ac, + 2:50>150 (taking less than before d/t weight loss)    Denies symptoms consistent with peripheral or autonomic neuropathy  Denies recent lows requiring assistance  Denies recent nocturnal lows  DFE scheduled setup [place] locally     _______________________________________  Since last visit:   Wants Omnipod, using Libre  Recent surgery herniated disk, had to quit job  Wife accompanies and works EMS and they have done research on that one and [employer] does diabetes program  Weight loss due to dietary changes  Stopped drinking soda and mainly water/SF juice  Had physical job Radiographer, therapeutic house    Currently taking the following for CFRD:   Hospital doctor Lantus insulin 50 units qhs  Aspart insulin ~30 units ac, + 2:50>150 (taking less than before d/t weight loss)    Denies symptoms consistent with peripheral or autonomic neuropathy  Denies recent lows requiring assistance  Denies recent nocturnal lows  DFE scheduled setup [place] locally     Continuous glucose monitor (CGM) was computer downloaded, I personally reviewed greater than 72 hours of data and interpreted, and discussed the findings with the patient.   Evening excursions        __________________________________________________  ________________________      Initial HPI carried forward.   Patient is a 35 year old male with a PMH of CF, HTN and GAD/MDD who presents for a recurrence of CFRD.     The patient was initially treated for cystic fibrosis related diabetes (CFRD) when he received his care at [facility] with 5 units of levemir at night. Subsequently, after moving to [place], he was taken off of his diabetes medications which was approximately 3 years ago. Since then he has continued to check his blood sugar approximately three times per day. Recently, his blood sugars have been typically running 200's - 300's. He was scheduled for endocrinology assessment for CFRD and prior to today's appointment, his hemoglobin A1c was found to be 7.6%. Upon arrival to the clinic today, his POCT glucose was 306 mg/dL.     He denies any regular hypglycemic episodes. If he does not eat much, he reports that he will occasionally dip lower than usual (approxiamtely once per week his blood glucose will run <100 mg/dL and approximately once per month his blood glucose will be <60 mg/dL). He states that recently, however, the lowest he has seen is 160 mg/dL. He endorses occasional blurriness in his vision and denies any neuropathic symptoms in his feet. He requires a new meter. His cystic fibrosis affects his lungs, sinuses and GI tract; he endorses pancreatic involvement. He does not appear malnourished. Most recently he was hospitalized in January 2019 for a CF-bronchiectasis exacerbation. He had a RUL lobectomy  in August 2018. For his HTN, he takes lisinopril and carvedilol and he takes lamotrigine for his GAD and MDD.    The past medical history, surgical history, family history, social history, medications and allergies were reviewed in Epic.    Past Medical History:   Diagnosis Date   ??? Anxiety    ??? Chronic pain disorder    ??? Cystic fibrosis (CMS-HCC)    ??? Depression    ??? Hypertension    ??? Lumbar radiculopathy 10/26/2020   ??? Nonproductive cough 04/05/2018     Past Surgical History:   Procedure Laterality Date   ??? IR INSERT PORT AGE GREATER THAN 5 YRS  03/27/2019    IR INSERT PORT AGE GREATER THAN 5 YRS 03/27/2019 Rush Barer, MD IMG [facility]IR [facility]   ??? PR REMOVAL OF LUNG,LOBECTOMY Right 03/29/2017    Procedure: REMOVAL OF LUNG, OTHER THAN PNEUMONECTOMY; SINGLE LOBE (LOBECTOMY);  Surgeon: Cherie Dark, MD;  Location: MAIN OR [facility];  Service: Thoracic     Current Outpatient Medications on File Prior to Visit   Medication Sig Dispense Refill   ??? albuterol (PROVENTIL HFA;VENTOLIN HFA) 90 mcg/actuation inhaler Inhale 2 puffs every six (6) hours as needed. 1 Inhaler 1   ??? albuterol 2.5 mg /3 mL (0.083 %) nebulizer solution Inhale 3 mL (2.5 mg total) by nebulization two (2) times a day. 540 mL 3   ??? amitriptyline (ELAVIL) 100 MG tablet TAKE ONE TABLET BY MOUTH AT BEDTIME (Patient taking differently: No sig reported) 30 tablet 1   ??? atorvastatin (LIPITOR) 20 MG tablet Take 20 mg by mouth nightly.     ??? blood sugar diagnostic (GLUCOSE BLOOD) Strp by Other route Three (3) times a day with a meal. Rx sent to [facility] 01/04/20     ??? blood-glucose meter kit Dispense meter that is preferred by patient's insurance company 1 each 0   ??? blood-glucose meter,continuous (DEXCOM G6 RECEIVER) Misc Use as directed 1 each 0   ??? blood-glucose sensor (DEXCOM G6 SENSOR) Devi Use sq as directed every 10 days 9 each 11   ??? blood-glucose transmitter (DEXCOM G6 TRANSMITTER) Devi Use as directed, replace every 90 days 1 each 11   ??? budesonide-formoteroL (SYMBICORT) 160-4.5 mcg/actuation inhaler Inhale 2 puffs Two (2) times a day. 30.6 g 3   ??? cetirizine (ZYRTEC) 10 MG tablet Take 1 tablet (10 mg total) by mouth daily. 30 tablet 11   ??? dornase alfa (PULMOZYME) 1 mg/mL nebulizer solution Inhale 2.5 mg daily. 450 mL 3   ??? EASY TOUCH LANCING DEVICE Misc Use as directed.     ??? EASY TOUCH TWIST LANCETS 30 gauge Misc Use as directed.     ??? elexacaftor-tezacaftor-ivacaft (TRIKAFTA) 100-50-75 mg(d) /150 mg (n) tablet Take 2 Tablets (orange) by mouth in the morning and 1 tablet (blue) in the evening with fatty food 84 tablet 6   ??? FLUoxetine 60 mg Tab Take 60 mg by mouth daily.      ??? fluticasone propionate (FLONASE) 50 mcg/actuation nasal spray 2 sprays into each nostril daily.     ??? FREESTYLE LIBRE 14 DAY SENSOR kit Use 3-4 times daily     ??? gabapentin (NEURONTIN) 600 MG tablet Take 600 mg by mouth Three (3) times a day.     ??? insulin ASPART (NOVOLOG FLEXPEN) 100 unit/mL (3 mL) injection pen Inject 0.3 mL (30 Units total) under the skin Three (3) times a day before meals. Plus sliding scale 27 mL 0   ???  insulin ASPART (NOVOLOG U-100 INSULIN ASPART) 100 unit/mL injection Subcutaneously infuse up to 150 units daily via insulin pump 150 mL 11   ??? insulin pump cart,automated,BT (OMNIPOD 5 G6 PODS, GEN 5,) Crtg 1 each by subcutaneous (via wearable injector) route every three (3) days. Change every 72 hour 10 each 12   ??? lamoTRIgine (LAMICTAL) 200 MG tablet Take 400 mg by mouth daily.     ??? lipase-protease-amylase (CREON) 36,000-114,000- 180,000 unit CpDR Take 6 caps by mouth with meals three times daily and 4 caps with snacks up to three times a day.     ??? lipase-protease-amylase (CREON) 36,000-114,000- 180,000 unit CpDR Take 8 caps with meals TID and 4 caps with snacks up to three times a day. 1200 capsule 11   ??? lisinopriL (PRINIVIL,ZESTRIL) 10 MG tablet Take 1 tablet (10 mg total) by mouth daily. 30 tablet 0   ??? melatonin 10 mg Tab Take 10 mg by mouth nightly.     ??? montelukast (SINGULAIR) 10 mg tablet Take 1 tablet (10 mg total) by mouth nightly. 90 tablet 3   ??? MVW COMPLETE FORMUL PROBIOTIC 40 billion cell -15 mg CpDR Take 1 capsule by mouth daily. (Patient taking differently: Take 1 capsule by mouth daily.) 90 capsule 3   ??? MVW Complete, pediatric multivit 61-D3-vit K, (MVW COMPLETE FORMULATION) 1,500-800 unit-mcg cap Take 2 capsules by mouth Two (2) times a day.     ??? nebulizers Misc Use as directed with inhaled medications 4 each 3   ??? omeprazole (PRILOSEC) 20 MG capsule Take 1 capsule (20 mg total) by mouth in the morning. 90 capsule 3   ??? oxyCODONE (OXY-IR) 5 mg capsule Take 5 mg by mouth every four (4) hours as needed for pain. 5-10mg  prn pain (s/p back surgery) (Patient not taking: Reported on 06/08/2021)     ??? pen needle, diabetic (BD ULTRA-FINE NANO PEN NEEDLE) 32 gauge x 5/32 (4 mm) Ndle use up to 4 times daily 400 each 12   ??? [EXPIRED] piperacillin-tazobactam (ZOSYN) 4.5 gram/100 mL PgBk Infuse 100 mL (4.5 g total) into a venous catheter every six (6) hours for 10 days. 4000 mL 0   ??? pramipexole (MIRAPEX) 0.125 MG tablet Take 0.25 mg by mouth every evening.      ??? sodium chloride 7% 7 % Nebu Inhale 4 mL by nebulization Two (2) times a day. Increase to 4 times while taking antibiotics     ??? [EXPIRED] tobramycin 280 mg, OVERFILL 10 mL in sodium chloride 0.9 % 100 mL IVPB Infuse 280 mg into a venous catheter every twelve (12) hours for 10 days. 1 each 0   ??? traZODone (DESYREL) 150 MG tablet Take 150 mg by mouth nightly.     ??? XARELTO 20 mg tablet Take 20 mg by mouth daily.       No current facility-administered medications on file prior to visit.     SOCIAL HISTORY:  Social History     Social History Narrative   ??? Not on file       FAMILY HISTORY:  Family History   Problem Relation Age of Onset   ??? Bipolar disorder Mother    ??? Depression Mother      ROS:  The balance of 10 systems was reviewed and unremarkable except as stated above.     Objective:   There were no vitals taken for this visit.  ***  Alert and oriented with appropriate mood and affect, appears well and in no  distress  NECK: Supple without carotid bruits, thyromegaly or lymphadenopathy.   HEART: Regular rate and rhythm without murmur, rub, gallop.  ABDOMEN: Soft and nontender without masses.   EXTREMITIES: Showed no edema, distal pulse intact, hygiene good  NEURO: Sensation intact on the soles of the feet to the 10g monofilament on bilateral barefoot examination      REVIEW OF RECENT PERTINENT LABORATORY AND IMAGING DATA:  Lab Results   Component Value Date    A1C 9.0 (H) 06/08/2021    A1C 10.0 (H) 03/09/2021    A1C 10.7 (H) 11/29/2020    A1C 9.4 (H) 01/18/2020    A1C 8.8 (H) 09/29/2019    A1C 8.5 (H) 08/12/2018    A1C 10.7 (H) 04/06/2018    A1C 9.0 (H) 01/07/2018     Lab Results   Component Value Date    CREATININE 0.85 06/15/2021     Lab Results   Component Value Date    GFRNONAA 114 01/18/2020     Lab Results   Component Value Date    GFRAA 132 01/18/2020     Lab Results   Component Value Date    K 4.5 06/15/2021     Lab Results   Component Value Date    CHOL 148 12/05/2020     Lab Results   Component Value Date    LDL 96 12/05/2020     Lab Results   Component Value Date    HDL 36 (L) 12/05/2020     Lab Results   Component Value Date    TRIG 80 12/05/2020     Lab Results   Component Value Date    ALT 34 06/15/2021    ALT 31 01/18/2020     No results found for: LABCREA, ALBQTUR, MSHCGMOM, ALBCRERAT  No results found for: VITAMINB12  Lab Results   Component Value Date    TSH 2.120 11/18/2016     Lab Results   Component Value Date    WBC 8.1 06/15/2021    HGB 13.5 06/15/2021    HCT 39.7 06/15/2021    PLT 357 06/15/2021     Lab Results   Component Value Date    VITDTOTAL 38.2 02/06/2021     Lab Results   Component Value Date    CALCIUM 9.7 06/15/2021       Assessment/Plan:   1. Diabetes mellitus related to CF (cystic fibrosis) (CMS-HCC)  -Recent A1c far above goal though CGM data indicates recent improvements not yet captured, agree w exploring pump options, reviewed basic principles automated insulin delivery today which integrate w Dexcom, he would like to start w Dexcom and then determine pump choice upon d/w CDE and further research  -Advised inc'd or prebolus Novolog ac dinner, commended on recent improvements in BG data      2. Encounter for long-term (current) use of insulin (CMS-HCC)  As above    3. Obesity  Noted, commended on efforts    Follow up recommended in about 4 mo w CDE interim for pump discussion, he is aware to contact us or return sooner for interim concerns.

## 2021-07-06 NOTE — Unmapped (Incomplete)
It was a pleasure seeing you today at our [facility].

## 2021-07-10 ENCOUNTER — Ambulatory Visit: Admit: 2021-06-16 | Payer: PRIVATE HEALTH INSURANCE

## 2021-07-21 NOTE — Unmapped (Signed)
The [facility] has made a third and final attempt to reach this patient to refill the following medication:  Trikafta.      We have left voicemails on the following phone numbers: [phone] [facility]) and have sent a MyChart message.    Dates contacted: 12/01, 12/06 & 12/09    Last scheduled delivery: 06/19/2021    The patient may be at risk of non-compliance with this medication. The patient should call the [facility] at [phone]  Option 4, then Option 2 (all other specialty patients) to refill medication.    Aidah Forquer Leodis Binet   [facility] Specialty Technician

## 2021-07-29 ENCOUNTER — Ambulatory Visit
Admit: 2021-07-29 | Discharge: 2021-08-02 | Disposition: A | Discharge status: Home / Self Care | Payer: PRIVATE HEALTH INSURANCE

## 2021-07-29 ENCOUNTER — Ambulatory Visit: Admit: 2021-07-29 | Discharge: 2021-08-02 | Payer: PRIVATE HEALTH INSURANCE

## 2021-07-29 LAB — CBC W/ AUTO DIFF
BASOPHILS ABSOLUTE COUNT: 0.1 10*9/L (ref 0.0–0.1)
BASOPHILS RELATIVE PERCENT: 1.4 %
EOSINOPHILS ABSOLUTE COUNT: 0.2 10*9/L (ref 0.0–0.5)
EOSINOPHILS RELATIVE PERCENT: 1.7 %
HEMATOCRIT: 45.3 % (ref 39.0–48.0)
HEMOGLOBIN: 15.1 g/dL (ref 12.9–16.5)
LYMPHOCYTES ABSOLUTE COUNT: 2.2 10*9/L (ref 1.1–3.6)
LYMPHOCYTES RELATIVE PERCENT: 23.7 %
MEAN CORPUSCULAR HEMOGLOBIN CONC: 33.3 g/dL (ref 32.0–36.0)
MEAN CORPUSCULAR HEMOGLOBIN: 27 pg (ref 25.9–32.4)
MEAN CORPUSCULAR VOLUME: 81 fL (ref 77.6–95.7)
MEAN PLATELET VOLUME: 9 fL (ref 6.8–10.7)
MONOCYTES ABSOLUTE COUNT: 0.8 10*9/L (ref 0.3–0.8)
MONOCYTES RELATIVE PERCENT: 8.2 %
NEUTROPHILS ABSOLUTE COUNT: 6.1 10*9/L (ref 1.8–7.8)
NEUTROPHILS RELATIVE PERCENT: 65 %
PLATELET COUNT: 310 10*9/L (ref 150–450)
RED BLOOD CELL COUNT: 5.6 10*12/L (ref 4.26–5.60)
RED CELL DISTRIBUTION WIDTH: 15.7 % — ABNORMAL HIGH (ref 12.2–15.2)
WBC ADJUSTED: 9.4 10*9/L (ref 3.6–11.2)

## 2021-07-29 LAB — COMPREHENSIVE METABOLIC PANEL
ALBUMIN: 4 g/dL (ref 3.4–5.0)
ALKALINE PHOSPHATASE: 124 U/L — ABNORMAL HIGH (ref 46–116)
ALT (SGPT): 58 U/L — ABNORMAL HIGH (ref 10–49)
ANION GAP: 11 mmol/L (ref 5–14)
AST (SGOT): 33 U/L (ref <=34)
BILIRUBIN TOTAL: 0.4 mg/dL (ref 0.3–1.2)
BLOOD UREA NITROGEN: <5 mg/dL — ABNORMAL LOW (ref 9–23)
CALCIUM: 8.9 mg/dL (ref 8.7–10.4)
CHLORIDE: 101 mmol/L (ref 98–107)
CO2: 21 mmol/L (ref 20.0–31.0)
CREATININE: 0.63 mg/dL
EGFR CKD-EPI (2021) MALE: >90 mL/min/{1.73_m2} (ref >=60)
GLUCOSE RANDOM: 400 mg/dL — ABNORMAL HIGH (ref 70–179)
POTASSIUM: 4.2 mmol/L (ref 3.4–4.8)
PROTEIN TOTAL: 7.4 g/dL (ref 5.7–8.2)
SODIUM: 133 mmol/L — ABNORMAL LOW (ref 135–145)

## 2021-07-29 LAB — TOBRAMYCIN LEVEL, RANDOM: TOBRAMYCIN RANDOM: <0.3 ug/mL

## 2021-07-29 LAB — HIGH SENSITIVITY TROPONIN I - SINGLE: HIGH SENSITIVITY TROPONIN I: <3 ng/L (ref <=53)

## 2021-07-29 MED ADMIN — MORPhine 4 mg/mL injection 4 mg: 4 mg | INTRAVENOUS | @ 21:00:00 | Stop: 2021-07-29

## 2021-07-29 MED ADMIN — MORPhine injection 2 mg: 2 mg | INTRAVENOUS | Stop: 2021-07-31

## 2021-07-29 MED ADMIN — sodium chloride 0.9% (NS) bolus 500 mL: 500 mL | INTRAVENOUS | @ 21:00:00 | Stop: 2021-07-29

## 2021-07-29 NOTE — Unmapped (Signed)
SOB and CP, feels like a CF flare

## 2021-07-29 NOTE — Unmapped (Shared)
[facility]  Emergency Department Medical Screening Examination     Subjective     Christian Young is a 35 y.o. male presenting for evaluation of Shortness of Breath. The patient reports an acute onset of generalized weakness, shortness of breath, and nonproductive cough in the setting of cystic fibrtosis. The patient has been around some sick individuals at home with Influenza. He tested negative for this and COVID at home. he was seen by his PCP and was prescribed a 10 day course of Prednisone, and he is currently on the 4th day with some worsening of his symptoms. He denies any fever or chills.      Abbreviated Review of Systems/Covid Screen  Constitutional: Negative for fever  Respiratory: Positive for cough. Positive for difficulty breathing.    Objective     ED Triage Vitals   Enc Vitals Group      BP 07/29/21 1349 170/111      Heart Rate 07/29/21 1348 105      SpO2 Pulse --       Resp 07/29/21 1349 16      Temp 07/29/21 1349 37.1 ??C (98.8 ??F)      Temp Source 07/29/21 1349 Oral      SpO2 07/29/21 1348 97 %      Weight --       Height --       Head Circumference --       Peak Flow --       Pain Score --       Pain Loc --       Pain Edu? --       Excl. in GC? --        Focused Physical Exam  Constitutional: No acute distress.  Respiratory: Non-labored respirations.  Neurological: Clear speech. No gross focal neurologic deficits are appreciated.    ?  Assessment & Plan     Appropriate ED triage orders placed including RPP, basic labs, and CXR. Will defer further work-up and assessment to the main ED team.      A medical screening exam has been performed. At the time of this evaluation, no emergency medical condition requiring immediate stabilization has been identified nor is there suspicion for imminent decompensation. Appropriate triage protocols will be implemented and a comprehensive ED evaluation with disposition will be completed by a healthcare provider when an appropriate ED location becomes available. The patient is aware that this is an initial encounter only and verbalizes understanding and agreement with the plan.        Emergency Department operations continue to be impacted by the COVID-19 pandemic.     Arliss Journey, DO  July 29, 2021, 1:48 PM       Documentation assistance was provided by Osvaldo Human, Scribe, on July 29, 2021 at 1:48 PM for Arliss Journey, DO.         {*** NOTE TO PROVIDER: PLEASE ADD EDPROVSCRIBEATTEST NOTING YOU AGREE WITH SCRIBE DOCUMENTATION}

## 2021-07-29 NOTE — Unmapped (Signed)
[facility]  Emergency Department Provider Note      ED Clinical Impression     Final diagnoses:   Cystic fibrosis exacerbation (CMS-HCC) (Primary)       Initial Impression, ED Course, Assessment and Plan     Impression: Christian Young is a 35 y.o. male with history of CF who is presenting with 4 days of worsening cough, congestion, dyspnea, and chest pain.  History as per below.  Briefly, the the symptoms, the setting of family members at home have been URI-like symptoms recently.  Patient denies any fever, has been tolerating p.o. somewhat well though is reduced.  He reports his chest pain feels like an aching pain from coughing.  He has no lower extremity edema he reports.    On evaluation here in the emergency department, patient is afebrile, mildly tachycardic, nontachypneic and satting in the low 90s on room air.  He has a hoarse voice, clear lung sounds bilaterally with good air movement bilaterally.  He strong peripheral pulses.  He has no lower extremity edema or pain to palpation of the lower extremities.    Patient is a 35 year old male with a history of CF with rehospitalization for CF exacerbation found to be growing MSSA and mucoid Pseudomonas discharged on a course of tobramycin and Zosyn which she completed who is presenting with 4 days of worsening productive cough, dyspnea, congestion, poor p.o. intake and chest pain.  Symptoms are consistent with CF exacerbation, cannot rule out viral illness given recent sick contacts at home though given his underlying CF must assume that this is bacterial in nature.  We will plan to obtain lab work, chest x-ray, RPP, EKG, respiratory culture and admit the patient for CF exacerbation.  Patient is amenable to this plan.    ED Triage Vitals   Vital Signs Group      Temp 07/29/21 1349 37.1 ??C (98.8 ??F)      Temp Source 07/29/21 1349 Oral      Heart Rate 07/29/21 1348 105      SpO2 Pulse 07/29/21 1507 97      Heart Rate Source 07/29/21 1349 Monitor      Resp 07/29/21 1349 16      BP 07/29/21 1349 170/111      MAP (mmHg) 07/29/21 1349 121      BP Location 07/29/21 1349 Right arm      BP Method 07/29/21 1349 Automatic      Patient Position 07/29/21 1349 Sitting   SpO2 07/29/21 1348 97 %   O2 Flow Rate (L/min) --    O2 Device 07/29/21 1507 None (Room air)            Additional Medical Decision Making     I have reviewed the vital signs and the nursing notes. Labs and radiology results that were available during my care of the patient were independently reviewed by me and considered in my medical decision making.     Case discussed with ED attending Dr. Claretha Cooper who is agreeable with the plan.    ____________________________________________       History     Chief Complaint  Shortness of Breath      HPI   Christian Young is a 35 y.o. male with history of CF who is presenting with 4 days of worsening cough, congestion, dyspnea, and chest pain.  Patient reports he has had 4 days of symptoms.  During this time he has had worsening productive cough,  congestion.  He also endorses some dyspnea more recently.  He has had some chest pain he believes is related to his coughing as midsternal in nature.  He also reports some reduced p.o. intake.  He denies any fever at home.  He has not needed any supplemental oxygen.  He reports that at home recently family members have had URI-like symptoms.  No relieving/worsening factors other than those listed above.    Review of Systems:  10 point review of systems conducted and negative unless otherwise noted in HPI and MDM above    Past Medical History:   Diagnosis Date   ??? Anxiety    ??? Chronic pain disorder    ??? Cystic fibrosis (CMS-HCC)    ??? Depression    ??? Hypertension    ??? Lumbar radiculopathy 10/26/2020   ??? Nonproductive cough 04/05/2018       Patient Active Problem List   Diagnosis   ??? Essential hypertension   ??? Depressive disorder   ??? Mood disorder (CMS-HCC)   ??? Anxiety   ??? 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 Chronic pansinusitis   ??? Pancreatic insufficiency due to cystic fibrosis (CMS-HCC)   ??? History of Mycobacterium abscessus infection   ??? Bronchiectasis (CMS-HCC)   ??? Chronic deep vein thrombosis (DVT) of lower extremity (CMS-HCC)   ??? Cystic fibrosis with pulmonary exacerbation (CMS-HCC)   ??? Cystic fibrosis (CMS-HCC)   ??? Obesity (BMI 30-39.9)   ??? Restless leg syndrome   ??? Degeneration of lumbar intervertebral disc   ??? Lumbar radiculopathy   ??? Idiopathic peripheral neuropathy   ??? Long term current use of anticoagulant therapy   ??? Pain of lumbar spine   ??? Acute non-recurrent pansinusitis       Past Surgical History:   Procedure Laterality Date   ??? IR INSERT PORT AGE GREATER THAN 5 YRS  03/27/2019    IR INSERT PORT AGE GREATER THAN 5 YRS 03/27/2019 Rush Barer, MD IMG VIR [facility]   ??? PR REMOVAL OF LUNG,LOBECTOMY Right 03/29/2017    Procedure: REMOVAL OF LUNG, OTHER THAN PNEUMONECTOMY; SINGLE LOBE (LOBECTOMY);  Surgeon: Cherie Dark, MD;  Location: MAIN OR [facility]  Service: Thoracic         Current Facility-Administered Medications:   ???  MORPhine 4 mg/mL injection 4 mg, 4 mg, Intravenous, Once, Orlene Plum, MD    Current Outpatient Medications:   ???  albuterol (PROVENTIL HFA;VENTOLIN HFA) 90 mcg/actuation inhaler, Inhale 2 puffs every six (6) hours as needed., Disp: 1 Inhaler, Rfl: 1  ???  albuterol 2.5 mg /3 mL (0.083 %) nebulizer solution, Inhale 3 mL (2.5 mg total) by nebulization two (2) times a day., Disp: 540 mL, Rfl: 3  ???  amitriptyline (ELAVIL) 100 MG tablet, TAKE ONE TABLET BY MOUTH AT BEDTIME (Patient taking differently: No sig reported), Disp: 30 tablet, Rfl: 1  ???  atorvastatin (LIPITOR) 20 MG tablet, Take 20 mg by mouth nightly., Disp: , Rfl:   ???  blood sugar diagnostic (GLUCOSE BLOOD) Strp, by Other route Three (3) times a day with a meal. Rx sent to [facility] 01/04/20, Disp: , Rfl:   ???  blood-glucose meter kit, Dispense meter that is preferred by [employer], Disp: 1 each, Rfl: 0  ???  blood-glucose meter,continuous (DEXCOM G6 RECEIVER) Misc, Use as directed, Disp: 1 each, Rfl: 0  ???  blood-glucose sensor (DEXCOM G6 SENSOR) Devi, Use sq as directed every 10 days, Disp: 9 each,  Rfl: 11  ???  blood-glucose transmitter (DEXCOM G6 TRANSMITTER) Devi, Use as directed, replace every 90 days, Disp: 1 each, Rfl: 11  ???  budesonide-formoteroL (SYMBICORT) 160-4.5 mcg/actuation inhaler, Inhale 2 puffs Two (2) times a day., Disp: 30.6 g, Rfl: 3  ???  cetirizine (ZYRTEC) 10 MG tablet, Take 1 tablet (10 mg total) by mouth daily., Disp: 30 tablet, Rfl: 11  ???  dornase alfa (PULMOZYME) 1 mg/mL nebulizer solution, Inhale 2.5 mg daily., Disp: 450 mL, Rfl: 3  ???  EASY TOUCH LANCING DEVICE Misc, Use as directed., Disp: , Rfl:   ???  EASY TOUCH TWIST LANCETS 30 gauge Misc, Use as directed., Disp: , Rfl:   ???  elexacaftor-tezacaftor-ivacaft (TRIKAFTA) 100-50-75 mg(d) /150 mg (n) tablet, Take 2 Tablets (orange) by mouth in the morning and 1 tablet (blue) in the evening with fatty food, Disp: 84 tablet, Rfl: 6  ???  FLUoxetine 60 mg Tab, Take 60 mg by mouth daily. , Disp: , Rfl:   ???  fluticasone propionate (FLONASE) 50 mcg/actuation nasal spray, 2 sprays into each nostril daily., Disp: , Rfl:   ???  FREESTYLE LIBRE 14 DAY SENSOR kit, Use 3-4 times daily, Disp: , Rfl:   ???  gabapentin (NEURONTIN) 600 MG tablet, Take 600 mg by mouth Three (3) times a day., Disp: , Rfl:   ???  insulin ASPART (NOVOLOG FLEXPEN) 100 unit/mL (3 mL) injection pen, Inject 0.3 mL (30 Units total) under the skin Three (3) times a day before meals. Plus sliding scale, Disp: 27 mL, Rfl: 0  ???  insulin ASPART (NOVOLOG U-100 INSULIN ASPART) 100 unit/mL injection, Subcutaneously infuse up to 150 units daily via insulin pump, Disp: 150 mL, Rfl: 11  ???  insulin pump cart,automated,BT (OMNIPOD 5 G6 PODS, GEN 5,) Crtg, 1 each by subcutaneous (via wearable injector) route every three (3) days. Change every 72 hour, Disp: 10 each, Rfl: 12  ??? lamoTRIgine (LAMICTAL) 200 MG tablet, Take 400 mg by mouth daily., Disp: , Rfl:   ???  lipase-protease-amylase (CREON) 36,000-114,000- 180,000 unit CpDR, Take 6 caps by mouth with meals three times daily and 4 caps with snacks up to three times a day., Disp: , Rfl:   ???  lipase-protease-amylase (CREON) 36,000-114,000- 180,000 unit CpDR, Take 8 caps with meals TID and 4 caps with snacks up to three times a day., Disp: 1200 capsule, Rfl: 11  ???  lisinopriL (PRINIVIL,ZESTRIL) 10 MG tablet, Take 1 tablet (10 mg total) by mouth daily., Disp: 30 tablet, Rfl: 0  ???  melatonin 10 mg Tab, Take 10 mg by mouth nightly., Disp: , Rfl:   ???  montelukast (SINGULAIR) 10 mg tablet, Take 1 tablet (10 mg total) by mouth nightly., Disp: 90 tablet, Rfl: 3  ???  MVW COMPLETE FORMUL PROBIOTIC 40 billion cell -15 mg CpDR, Take 1 capsule by mouth daily. (Patient taking differently: Take 1 capsule by mouth daily.), Disp: 90 capsule, Rfl: 3  ???  MVW Complete, pediatric multivit 61-D3-vit K, (MVW COMPLETE FORMULATION) 1,500-800 unit-mcg cap, Take 2 capsules by mouth Two (2) times a day., Disp: , Rfl:   ???  nebulizers Misc, Use as directed with inhaled medications, Disp: 4 each, Rfl: 3  ???  omeprazole (PRILOSEC) 20 MG capsule, Take 1 capsule (20 mg total) by mouth in the morning., Disp: 90 capsule, Rfl: 3  ???  oxyCODONE (OXY-IR) 5 mg capsule, Take 5 mg by mouth every four (4) hours as needed for pain. 5-10mg  prn pain (s/p back surgery) (Patient  not taking: Reported on 06/08/2021), Disp: , Rfl:   ???  pen needle, diabetic (BD ULTRA-FINE NANO PEN NEEDLE) 32 gauge x 5/32 (4 mm) Ndle, use up to 4 times daily, Disp: 400 each, Rfl: 12  ???  pramipexole (MIRAPEX) 0.125 MG tablet, Take 0.25 mg by mouth every evening. , Disp: , Rfl:   ???  sodium chloride 7% 7 % Nebu, Inhale 4 mL by nebulization Two (2) times a day. Increase to 4 times while taking antibiotics, Disp: , Rfl:   ???  traZODone (DESYREL) 150 MG tablet, Take 150 mg by mouth nightly., Disp: , Rfl:   ???  XARELTO 20 mg tablet, Take 20 mg by mouth daily., Disp: , Rfl:     Allergies  Aztreonam, [place] [aztreonam lysine], Cefepime, Other, Slo-bid 100, Tobramycin, and Banana    Family History   Problem Relation Age of Onset   ??? Bipolar disorder Mother    ??? Depression Mother        Social History  Social History     Tobacco Use   ??? Smoking status: Former     Types: e-Cigarettes   ??? Smokeless tobacco: Never   Substance Use Topics   ??? Alcohol use: Not Currently     Alcohol/week: 3.0 standard drinks     Types: 3 Glasses of wine per week   ??? Drug use: No         Physical Exam     ED Triage Vitals   Enc Vitals Group      BP 07/29/21 1349 170/111      Heart Rate 07/29/21 1348 105      SpO2 Pulse 07/29/21 1507 97      Resp 07/29/21 1349 16      Temp 07/29/21 1349 37.1 ??C (98.8 ??F)      Temp Source 07/29/21 1349 Oral      SpO2 07/29/21 1348 97 %      Weight --       Height --       Head Circumference --       Peak Flow --       Pain Score --       Pain Loc --       Pain Edu? --       Excl. in GC? --        Constitutional: Alert and oriented. Well appearing. In no distress.  Eyes: Conjunctivae are normal.  ENT       Head: Normocephalic and atraumatic.       Nose: No congestion.       Mouth/Throat: Mucous membranes are moist.       Neck: No stridor.  Cardiovascular: rate as vital signs, regular rhythm. Normal and symmetric distal pulses are present in all extremities.  Respiratory: Normal respiratory effort, symmetrical expansion of chest. No wheezes, no rales.  GI/GU: Soft and nontender. No rigid abdomen present.  Musculoskeletal: Normal ROM all joints  Neurologic: Normal speech and language. No gross focal neurologic deficits are appreciated relative to baseline.  Skin: Skin is warm, dry and intact. No rash noted.  Psychiatric: Mood and affect are normal. Speech and behavior are normal.    Radiology     XR Chest 2 views   Preliminary Result   Small right-sided pleural effusion with adjacent patchy opacity favored to represent atelectasis, though superimposed infection cannot be excluded. Left basilar linear opacity likely representing atelectasis.          Pertinent labs &  imaging results that were available during my care of the patient were reviewed by me and considered in my medical decision making (see chart for details).     Please note- This chart has been created using [employer]. Chart creation errors have been sought, but may not always be located and such creation errors, especially pronoun confusion, do NOT reflect on the standard of medical care.        Orlene Plum, MD  Resident  07/29/21 [phone]

## 2021-07-30 LAB — BASIC METABOLIC PANEL
ANION GAP: 10 mmol/L (ref 5–14)
ANION GAP: 11 mmol/L (ref 5–14)
BLOOD UREA NITROGEN: 24 mg/dL — ABNORMAL HIGH (ref 9–23)
BLOOD UREA NITROGEN: 21 mg/dL (ref 9–23)
BUN / CREAT RATIO: 13
BUN / CREAT RATIO: 15
CALCIUM: 9.1 mg/dL (ref 8.7–10.4)
CALCIUM: 8.7 mg/dL (ref 8.7–10.4)
CHLORIDE: 100 mmol/L (ref 98–107)
CHLORIDE: 101 mmol/L (ref 98–107)
CO2: 26 mmol/L (ref 20.0–31.0)
CO2: 23 mmol/L (ref 20.0–31.0)
CREATININE: 1.79 mg/dL — ABNORMAL HIGH
CREATININE: 1.42 mg/dL — ABNORMAL HIGH
EGFR CKD-EPI (2021) MALE: 51 mL/min/{1.73_m2} — ABNORMAL LOW (ref >=60)
EGFR CKD-EPI (2021) MALE: 67 mL/min/{1.73_m2} (ref >=60)
GLUCOSE RANDOM: 210 mg/dL — ABNORMAL HIGH (ref 70–179)
GLUCOSE RANDOM: 327 mg/dL — ABNORMAL HIGH (ref 70–179)
POTASSIUM: 4.2 mmol/L (ref 3.4–4.8)
POTASSIUM: 4.3 mmol/L (ref 3.4–4.8)
SODIUM: 136 mmol/L (ref 135–145)
SODIUM: 135 mmol/L (ref 135–145)

## 2021-07-30 LAB — HEPATIC FUNCTION PANEL
ALBUMIN: 3.5 g/dL (ref 3.4–5.0)
ALKALINE PHOSPHATASE: 113 U/L (ref 46–116)
ALT (SGPT): 60 U/L — ABNORMAL HIGH (ref 10–49)
AST (SGOT): 35 U/L — ABNORMAL HIGH (ref <=34)
BILIRUBIN DIRECT: 0.2 mg/dL (ref 0.00–0.30)
BILIRUBIN TOTAL: 0.5 mg/dL (ref 0.3–1.2)
PROTEIN TOTAL: 6.4 g/dL (ref 5.7–8.2)

## 2021-07-30 LAB — URINALYSIS
BACTERIA: NONE SEEN /HPF
BILIRUBIN UA: NEGATIVE
BLOOD UA: NEGATIVE
GLUCOSE UA: >1000 — AB
KETONES UA: NEGATIVE
NITRITE UA: NEGATIVE
PH UA: 5.5 (ref 5.0–9.0)
RBC UA: 1 /HPF (ref <=3)
SPECIFIC GRAVITY UA: 1.021 (ref 1.003–1.030)
SQUAMOUS EPITHELIAL: 2 /HPF (ref 0–5)
TRANSITIONAL EPITHELIAL: <1 /HPF (ref 0–2)
UROBILINOGEN UA: <2
WBC UA: 4 /HPF — ABNORMAL HIGH (ref <=2)

## 2021-07-30 LAB — CBC W/ AUTO DIFF
BASOPHILS ABSOLUTE COUNT: 0.2 10*9/L — ABNORMAL HIGH (ref 0.0–0.1)
BASOPHILS RELATIVE PERCENT: 1.1 %
EOSINOPHILS ABSOLUTE COUNT: 0 10*9/L (ref 0.0–0.5)
EOSINOPHILS RELATIVE PERCENT: 0.1 %
HEMATOCRIT: 41.6 % (ref 39.0–48.0)
HEMOGLOBIN: 13.6 g/dL (ref 12.9–16.5)
LYMPHOCYTES ABSOLUTE COUNT: 0.4 10*9/L — ABNORMAL LOW (ref 1.1–3.6)
LYMPHOCYTES RELATIVE PERCENT: 2.6 %
MEAN CORPUSCULAR HEMOGLOBIN CONC: 32.8 g/dL (ref 32.0–36.0)
MEAN CORPUSCULAR HEMOGLOBIN: 26.3 pg (ref 25.9–32.4)
MEAN CORPUSCULAR VOLUME: 80.3 fL (ref 77.6–95.7)
MEAN PLATELET VOLUME: 8.5 fL (ref 6.8–10.7)
MONOCYTES ABSOLUTE COUNT: 1.4 10*9/L — ABNORMAL HIGH (ref 0.3–0.8)
MONOCYTES RELATIVE PERCENT: 8.2 %
NEUTROPHILS ABSOLUTE COUNT: 14.7 10*9/L — ABNORMAL HIGH (ref 1.8–7.8)
NEUTROPHILS RELATIVE PERCENT: 88 %
PLATELET COUNT: 262 10*9/L (ref 150–450)
RED BLOOD CELL COUNT: 5.18 10*12/L (ref 4.26–5.60)
RED CELL DISTRIBUTION WIDTH: 15.4 % — ABNORMAL HIGH (ref 12.2–15.2)
WBC ADJUSTED: 16.7 10*9/L — ABNORMAL HIGH (ref 3.6–11.2)

## 2021-07-30 LAB — TOXICOLOGY SCREEN, URINE
AMPHETAMINE SCREEN URINE: NEGATIVE
BARBITURATE SCREEN URINE: NEGATIVE
BENZODIAZEPINE SCREEN, URINE: NEGATIVE
BUPRENORPHINE, URINE SCREEN: NEGATIVE
CANNABINOID SCREEN URINE: NEGATIVE
COCAINE(METAB.)SCREEN, URINE: NEGATIVE
FENTANYL SCREEN, URINE: NEGATIVE
METHADONE SCREEN, URINE: NEGATIVE
OPIATE SCREEN URINE: POSITIVE — AB
OXYCODONE SCREEN URINE: POSITIVE — AB

## 2021-07-30 LAB — TOBRAMYCIN LEVEL, RANDOM
TOBRAMYCIN RANDOM: 7.1 ug/mL
TOBRAMYCIN RANDOM: 5.8 ug/mL

## 2021-07-30 LAB — BLOOD GAS CRITICAL CARE PANEL, VENOUS
BASE EXCESS VENOUS: 1.2 (ref -2.0–2.0)
CALCIUM IONIZED VENOUS (MG/DL): 4.74 mg/dL (ref 4.40–5.40)
GLUCOSE WHOLE BLOOD: 223 mg/dL — ABNORMAL HIGH (ref 70–179)
HCO3 VENOUS: 26 mmol/L (ref 22–27)
HEMOGLOBIN BLOOD GAS: 14.3 g/dL
LACTATE BLOOD VENOUS: 3.4 mmol/L — ABNORMAL HIGH (ref 0.5–1.8)
O2 SATURATION VENOUS: 60.7 % (ref 40.0–85.0)
PCO2 VENOUS: 45 mmHg (ref 40–60)
PH VENOUS: 7.38 (ref 7.32–7.43)
PO2 VENOUS: 36 mmHg (ref 30–55)
POTASSIUM WHOLE BLOOD: 3.8 mmol/L (ref 3.4–4.6)
SODIUM WHOLE BLOOD: 135 mmol/L (ref 135–145)

## 2021-07-30 LAB — PHOSPHORUS: PHOSPHORUS: 4.6 mg/dL (ref 2.4–5.1)

## 2021-07-30 LAB — PROTIME-INR
INR: 2
PROTIME: 23.3 s — ABNORMAL HIGH (ref 9.8–12.8)

## 2021-07-30 LAB — CK: CREATINE KINASE TOTAL: 102 U/L

## 2021-07-30 LAB — C-REACTIVE PROTEIN: C-REACTIVE PROTEIN: 5 mg/L (ref <=10.0)

## 2021-07-30 LAB — MAGNESIUM: MAGNESIUM: 1.5 mg/dL — ABNORMAL LOW (ref 1.6–2.6)

## 2021-07-30 LAB — LACTATE, VENOUS, WHOLE BLOOD: LACTATE BLOOD VENOUS: 1.9 mmol/L — ABNORMAL HIGH (ref 0.5–1.8)

## 2021-07-30 LAB — CORTISOL: CORTISOL TOTAL: 12.7 ug/dL

## 2021-07-30 MED ADMIN — piperacillin-tazobactam (ZOSYN) IVPB (premix) 4.5 g: 4.5 g | INTRAVENOUS | @ 10:00:00 | Stop: 2021-07-30

## 2021-07-30 MED ADMIN — gabapentin (NEURONTIN) capsule 600 mg: 600 mg | ORAL | @ 13:00:00

## 2021-07-30 MED ADMIN — lamoTRIgine (LaMICtal) tablet 400 mg: 400 mg | ORAL | @ 13:00:00

## 2021-07-30 MED ADMIN — fluticasone propionate (FLONASE) 50 mcg/actuation nasal spray 2 spray: 2 | NASAL | @ 14:00:00

## 2021-07-30 MED ADMIN — sodium chloride 0.9% (NS) bolus 1,000 mL: 1000 mL | INTRAVENOUS | @ 11:00:00 | Stop: 2021-07-30

## 2021-07-30 MED ADMIN — pramipexole (MIRAPEX) tablet 0.25 mg: .25 mg | ORAL | @ 23:00:00

## 2021-07-30 MED ADMIN — montelukast (SINGULAIR) tablet 10 mg: 10 mg | ORAL | @ 03:00:00

## 2021-07-30 MED ADMIN — insulin lispro (HumaLOG) injection 0-20 Units: 0-20 [IU] | SUBCUTANEOUS | @ 14:00:00

## 2021-07-30 MED ADMIN — pancrelipase (Lip-Prot-Amyl) (CREON) 24,000-76,000 -120,000 unit delayed release capsule 288,000 units of lipase: 12 | ORAL | @ 14:00:00

## 2021-07-30 MED ADMIN — sodium chloride 0.9% (NS) bolus 1,000 mL: 1000 mL | INTRAVENOUS | @ 10:00:00 | Stop: 2021-07-30

## 2021-07-30 MED ADMIN — FLUoxetine (PROzac) tablet/capsule 60 mg: 60 mg | ORAL | @ 16:00:00

## 2021-07-30 MED ADMIN — insulin lispro (HumaLOG) injection 30 Units: 30 [IU] | SUBCUTANEOUS | @ 16:00:00 | Stop: 2021-07-30

## 2021-07-30 MED ADMIN — gabapentin (NEURONTIN) capsule 600 mg: 600 mg | ORAL | @ 19:00:00

## 2021-07-30 MED ADMIN — oxyCODONE (ROXICODONE) immediate release tablet 5 mg: 5 mg | ORAL | @ 19:00:00 | Stop: 2021-08-04

## 2021-07-30 MED ADMIN — elexacaftor-tezacaftor-ivacaft (TRIKAFTA) tablet 2 tablet: 2 | ORAL | @ 13:00:00

## 2021-07-30 MED ADMIN — sodium chloride 7% nebulizer solution 4 mL: 4 mL | RESPIRATORY_TRACT | @ 16:00:00

## 2021-07-30 MED ADMIN — atorvastatin (LIPITOR) tablet 20 mg: 20 mg | ORAL | @ 03:00:00

## 2021-07-30 MED ADMIN — polyethylene glycol (MIRALAX) packet 17 g: 17 g | ORAL | @ 23:00:00

## 2021-07-30 MED ADMIN — traZODone (DESYREL) tablet 150 mg: 150 mg | ORAL | @ 03:00:00

## 2021-07-30 MED ADMIN — melatonin tablet 10.5 mg: 10 mg | ORAL | @ 03:00:00

## 2021-07-30 MED ADMIN — lamoTRIgine (LaMICtal) tablet 400 mg: 400 mg | ORAL | @ 03:00:00

## 2021-07-30 MED ADMIN — gabapentin (NEURONTIN) capsule 600 mg: 600 mg | ORAL | @ 03:00:00

## 2021-07-30 MED ADMIN — MORPhine injection 2 mg: 2 mg | INTRAVENOUS | @ 05:00:00 | Stop: 2021-07-30

## 2021-07-30 MED ADMIN — rivaroxaban (XARELTO) tablet 20 mg: 20 mg | ORAL | @ 13:00:00

## 2021-07-30 MED ADMIN — lactated ringers bolus 1,000 mL: 1000 mL | INTRAVENOUS | @ 13:00:00 | Stop: 2021-07-30

## 2021-07-30 MED ADMIN — pantoprazole (PROTONIX) EC tablet 20 mg: 20 mg | ORAL | @ 13:00:00

## 2021-07-30 MED ADMIN — pancrelipase (Lip-Prot-Amyl) (CREON) 24,000-76,000 -120,000 unit delayed release capsule 288,000 units of lipase: 12 | ORAL | @ 18:00:00

## 2021-07-30 MED ADMIN — tobramycin (NEBCIN) 250 mg in sodium chloride (NS) 0.9 % 100 mL IVPB: 250 mg | INTRAVENOUS | @ 03:00:00 | Stop: 2021-08-08

## 2021-07-30 MED ADMIN — sodium chloride 7% nebulizer solution 4 mL: 4 mL | RESPIRATORY_TRACT | @ 22:00:00

## 2021-07-30 MED ADMIN — amitriptyline (ELAVIL) tablet 100 mg: 100 mg | ORAL | @ 03:00:00

## 2021-07-30 MED ADMIN — pancrelipase (Lip-Prot-Amyl) (CREON) 24,000-76,000 -120,000 unit delayed release capsule 288,000 units of lipase: 12 | ORAL | @ 23:00:00

## 2021-07-30 MED ADMIN — insulin lispro (HumaLOG) injection 30 Units: 30 [IU] | SUBCUTANEOUS | @ 03:00:00

## 2021-07-30 MED ADMIN — pediatric multivitamin-vit D3 1,500 unit-vit K 800 mcg (MVW COMPLETE FORMULATION) capsule: 2 | ORAL | @ 03:00:00

## 2021-07-30 MED ADMIN — pediatric multivitamin-vit D3 1,500 unit-vit K 800 mcg (MVW COMPLETE FORMULATION) capsule: 2 | ORAL | @ 13:00:00

## 2021-07-30 MED ADMIN — lisinopriL (PRINIVIL,ZESTRIL) tablet 10 mg: 10 mg | ORAL | @ 03:00:00

## 2021-07-30 MED ADMIN — cetirizine (ZyrTEC) tablet 10 mg: 10 mg | ORAL | @ 13:00:00

## 2021-07-30 MED ADMIN — dornase alfa (PULMOZYME) 1 mg/mL solution 2.5 mg: 2.5 mg | RESPIRATORY_TRACT | @ 16:00:00

## 2021-07-30 MED ADMIN — insulin glargine (LANTUS) injection 40 Units: 40 [IU] | SUBCUTANEOUS | @ 03:00:00

## 2021-07-30 MED ADMIN — insulin lispro (HumaLOG) injection 0-20 Units: 0-20 [IU] | SUBCUTANEOUS | @ 19:00:00

## 2021-07-30 MED ADMIN — insulin lispro (HumaLOG) injection 32 Units: 32 [IU] | SUBCUTANEOUS

## 2021-07-30 MED ADMIN — elexacaftor-tezacaftor-ivacaft (TRIKAFTA) tablet 1 tablet: 1 | ORAL | @ 23:00:00

## 2021-07-30 MED ADMIN — pancrelipase (Lip-Prot-Amyl) (CREON) 24,000-76,000 -120,000 unit delayed release capsule 288,000 units of lipase: 12 | ORAL | @ 05:00:00

## 2021-07-30 MED ADMIN — albuterol 2.5 mg /3 mL (0.083 %) nebulizer solution 2.5 mg: 2.5 mg | RESPIRATORY_TRACT | @ 04:00:00

## 2021-07-30 MED ADMIN — sodium chloride 7% nebulizer solution 4 mL: 4 mL | RESPIRATORY_TRACT | @ 04:00:00

## 2021-07-30 MED ADMIN — oxyCODONE (ROXICODONE) immediate release tablet 2.5 mg: 2.5 mg | ORAL | @ 14:00:00 | Stop: 2021-07-30

## 2021-07-30 MED ADMIN — cefTAZidime (FORTAZ) 2 g in sodium chloride 0.9 % (NS) 100 mL IVPB-MBP: 2 g | INTRAVENOUS | @ 19:00:00 | Stop: 2021-08-13

## 2021-07-30 MED ADMIN — fluticasone furoate-vilanteroL (BREO ELLIPTA) 200-25 mcg/dose inhaler 1 puff: 1 | RESPIRATORY_TRACT | @ 13:00:00

## 2021-07-30 MED ADMIN — elexacaftor-tezacaftor-ivacaft (TRIKAFTA) tablet 1 tablet **PATIENT SUPPLIED MEDICATION**: 1 | ORAL | @ 03:00:00

## 2021-07-30 MED ADMIN — albuterol 2.5 mg /3 mL (0.083 %) nebulizer solution 2.5 mg: 2.5 mg | RESPIRATORY_TRACT | @ 13:00:00

## 2021-07-30 MED ADMIN — piperacillin-tazobactam (ZOSYN) IVPB (premix) 4.5 g: 4.5 g | INTRAVENOUS | @ 03:00:00 | Stop: 2021-08-08

## 2021-07-30 MED ADMIN — piperacillin-tazobactam (ZOSYN) IVPB (premix) 4.5 g: 4.5 g | INTRAVENOUS | @ 16:00:00 | Stop: 2021-07-30

## 2021-07-30 MED ADMIN — DAPTOmycin (CUBICIN) 650 mg in sodium chloride (NS) 0.9 % 50 mL IVPB: 6 mg/kg | INTRAVENOUS | @ 14:00:00 | Stop: 2021-07-30

## 2021-07-30 MED ADMIN — insulin lispro (HumaLOG) injection 30 Units: 30 [IU] | SUBCUTANEOUS | @ 19:00:00 | Stop: 2021-07-30

## 2021-07-30 NOTE — Unmapped (Incomplete)
Problem: Adult Inpatient Plan of Care  Goal: Plan of Care Review  Outcome: Progressing  Flowsheets (Taken 07/30/2021 0201)  Progress: no change  Plan of Care Reviewed With: patient  Note:   Pt admitted from ED with CF exacerbation. Treatment plan: nebs by R, IV abx. Initiated contact precautions for CF, h/o pseudomonas. ACHS- timed out according to meal times, supplemental insulin given overnight. Pt c/o pain 9/10- given 2 mg IV morphine with effective response. Noted hypotension with 0400 vitals. On recheck, able to rouse pt with slight improvement in BP, but pt remained somnolent. [facility] notified, RR activated when phone contact lost and BP remained low on manual recheck. Refer to RR documentation for detailed interventions and responses. Effective BP response to 2L bolus. Placed on 2L O2 via [place] for suspected OSA. Needs lactate following completion of 2d liter. Urinals at bedside. Bld cultures pending by phlebotomy. Lisinopril has been discontinued; morphine discontinued and changed to 2.5 oxy PRN.     Vitals:    07/29/21 1956 07/29/21 2146 07/29/21 2235 07/30/21 0012   BP:  136/93  117/6   Pulse:    95   Resp:    20   Temp:    35.8 ??C (96.5 ??F)   TempSrc:    Oral   SpO2:    95%   Weight: (!) 108.9 kg (240 lb)      Height:   182.9 cm (6')      Vitals:    07/30/21 0531 07/30/21 0534 07/30/21 0538 07/30/21 0545   BP:  93/58 100/65 105/63   Pulse: 76  90 73   Resp:       Temp:       TempSrc:       SpO2: 99%  99%    Weight:       Height:         I/O this shift:  In: 2566 [P.O.:250; IV Piggyback:2316]  Out: -     Scheduled Meds:  ??? albuterol  2.5 mg Nebulization BID   ??? amitriptyline  100 mg Oral Nightly   ??? atorvastatin  20 mg Oral Nightly   ??? elexacaftor-tezacaftor-ivacaft  1 tablet Oral Q PM   ??? gabapentin  600 mg Oral TID   ??? insulin glargine  40 Units Subcutaneous Nightly   ??? insulin lispro  0-20 Units Subcutaneous ACHS   ??? insulin lispro  30 Units Subcutaneous 3xd Meals   ??? lamoTRIgine  400 mg Oral Daily   ??? lisinopriL  10 mg Oral Daily   ??? melatonin  10.5 mg Oral Nightly   ??? montelukast  10 mg Oral Nightly   ??? pancrelipase (Lip-Prot-Amyl)  12 capsule- requested 8 capsules with dinner Oral 3xd Meals   ??? piperacillin-tazobactam (ZOSYN) IV (intermittent)  4.5 g Intravenous Q6H   ??? pramipexole  0.25 mg Oral Q PM   ??? sodium chloride 7%  4 mL Nebulization QID   ??? tobramycin (NEBCIN) IVPB (conventional dosing)  250 mg Intravenous Q12H   ??? traZODone  150 mg Oral Nightly     Continuous Infusions: none given  PRN Meds:  MORPhine 2mg  injection x1 for pain 9/10    Lab Results   Component Value Date/Time    NEUTROABS 14.7 (H) 07/30/2021 05:18 AM    PLT 262 07/30/2021 05:18 AM    HGB 13.6 07/30/2021 05:18 AM    GLU 210 (H) 07/30/2021 05:18 AM    POCGLU 202 (H) 07/30/2021 05:16 AM    K  4.2 07/30/2021 05:18 AM    K 3.8 07/30/2021 05:18 AM    MG 1.5 (L) 07/30/2021 05:18 AM      Goal: Patient-Specific Goal (Individualized)  Outcome: Progressing  Flowsheets (Taken 07/30/2021 0201)  Patient-Specific Goals (Include Timeframe): pt will remain on RA, will report adequately managed pain  Individualized Care Needs: IV abx, contact precautions, ACHS, RT to manage neb treatments  Anxieties, Fears or Concerns: none shared  Goal: Absence of Hospital-Acquired Illness or Injury  Outcome: Progressing  Intervention: Identify and Manage Fall Risk  Flowsheets (Taken 07/30/2021 0201)  Safety Interventions:  ??? low bed  ??? lighting adjusted for tasks/safety  ??? isolation precautions  ??? nonskid shoes/slippers when out of bed  Intervention: Prevent Skin Injury  Flowsheets (Taken 07/30/2021 0201)  Body Position: position changed independently  Intervention: Prevent and Manage VTE (Venous Thromboembolism) Risk  Flowsheets (Taken 07/30/2021 0201)  Activity Management:  ??? activity encouraged  ??? up ad lib  VTE Prevention/Management:  ??? ambulation promoted  ??? anticoagulant therapy  ??? fluids promoted  Intervention: Prevent Infection  Flowsheets (Taken 07/30/2021 0201)  Infection Prevention:  ??? hand hygiene promoted  ??? personal protective equipment utilized  ??? rest/sleep promoted  ??? single patient room provided  ??? visitors restricted/screened  ??? equipment surfaces disinfected  ??? environmental surveillance performed  Goal: Optimal Comfort and Wellbeing  Outcome: Progressing  Intervention: Monitor Pain and Promote Comfort  Flowsheets (Taken 07/30/2021 0201)  Pain Management Interventions:  ??? care clustered  ??? pain management plan reviewed with patient/caregiver  ??? quiet environment facilitated  ??? relaxation techniques promoted  Intervention: Provide Person-Centered Care  Flowsheets (Taken 07/30/2021 0201)  Trust Relationship/Rapport:  ??? choices provided  ??? emotional support provided  ??? empathic listening provided  ??? care explained  ??? questions answered  ??? questions encouraged  ??? reassurance provided  ??? thoughts/feelings acknowledged     Problem: Infection  Goal: Absence of Infection Signs and Symptoms  Outcome: Progressing  Intervention: Prevent or Manage Infection  Flowsheets (Taken 07/30/2021 0201)  Infection Management:  ??? aseptic technique maintained  ??? cultures obtained and sent to lab  Isolation Precautions:  ??? contact precautions initiated  ??? contact precautions maintained  Note: Awaiting: lower respiratory specimen; cup at bedside  Remain afebrile     Problem: Diabetes Comorbidity  Goal: Blood Glucose Level Within Targeted Range  Outcome: Progressing  Intervention: Monitor and Manage Glycemia  Flowsheets (Taken 07/30/2021 0201)  Glycemic Management: blood glucose monitored Diabetes Comorbidity  Goal: Blood Glucose Level Within Targeted Range  Outcome: Progressing  Intervention: Monitor and Manage Glycemia  Flowsheets (Taken 07/30/2021 0201)  Glycemic Management: blood glucose monitored

## 2021-07-30 NOTE — Unmapped (Signed)
Aminoglycoside Therapeutic Monitoring Pharmacy Note    Christian Young is a 35 y.o. male starting tobramycin. Date of therapy initiation: 07/29/21    Indication: pneumonia    Prior Dosing Information: None/new initiation     Goals:  Therapeutic Drug Levels  ?? Trough level: tobramycin < 1 mcg/mL  ?? Peak level: tobramycin 10 - 14 mcg/mL    Additional Clinical Monitoring/Outcomes  Renal function, volume status (intake and output)    Results:   ?? Not applicable   ?? Not applicable    Wt Readings from Last 1 Encounters:   06/08/21 (!) 113.4 kg (250 lb)     Lab Results   Component Value Date    CREATININE 0.63 07/29/2021       Pharmacokinetic Considerations and Significant Drug Interactions:  ??? Adult (estimated initial): Vd = 30.3 L, ke = 0.27 hr-1  ??? Concurrent nephrotoxic meds: zosyn    Assessment/Plan:  Recommendation(s)  ??? Start tobramycin 250 mg IV Q12H  ??? Estimated peak and trough on recommended regimen: peak = 8 mg/L, trough = 0.4 mg/L    Follow-up  ??? Level due: at 2 hrs and 8 hrs after first or second dose  ??? A pharmacist will continue to monitor and order levels as appropriate    Please page service pharmacist with questions/clarifications.    Shann Medal, PharmD

## 2021-07-30 NOTE — Unmapped (Addendum)
Bubba Camp, 35 y.o. male  Primary Care Provider: [facility]    CC: cough and fatigue    History of Present Illness:     Mr. Mahari Strahm is a 35 y.o. male with a PMH significant for CF who presents for cough and fatigue.    Onset of symptoms was 7-8 days ago. He developed fatigue, a cough (sometimes productive) and DOE. He was recently around his wife's family for Thanksgiving at which time several of them developed respiratory symptoms, some of which were reportedly had influenza. His symptoms have progressively worsened since onset. Quality of expectorate is greenish grey in color, but he does not produce sputum with every cough. He also reports anterior chest pain only when coughing. There is no blood. There are no modifying factors to his symptoms He reports associated chills but denies fevers (does not have a thermometer to take his tempeture at home). He also reports a decreased appetitive but denies nausea and vomiting. He states this feels like previous CF flares, but maybe a bit worse. He does not smoke tobacco. He saw his PCP several days ago and was started on doxycycline, but his symptoms continued to worsen.    RVP was negative on admission. EKG was unremarkable. CXR showed a small right sided pleural effusion and bibasilar infiltrates favored to be atelectasis. He was admitted for a presumed CF exacerbation.      Review of Systems:     An 11 system ROS was performed and was negative other than as above in the HPI.        Past Medical and Surgical History:     Past Medical History:   Diagnosis Date   ??? Anxiety    ??? Chronic pain disorder    ??? Cystic fibrosis (CMS-HCC)    ??? Depression    ??? Hypertension    ??? Lumbar radiculopathy 10/26/2020   ??? Nonproductive cough 04/05/2018       Past Surgical History:   Procedure Laterality Date   ??? IR INSERT PORT AGE GREATER THAN 5 YRS  03/27/2019    IR INSERT PORT AGE GREATER THAN 5 YRS 03/27/2019 Rush Barer, MD IMG VIR [facility] ??? PR REMOVAL OF LUNG,LOBECTOMY Right 03/29/2017    Procedure: REMOVAL OF LUNG, OTHER THAN PNEUMONECTOMY; SINGLE LOBE (LOBECTOMY);  Surgeon: Cherie Dark, MD;  Location: MAIN OR [facility];  Service: Thoracic         Social History:     Social History     Socioeconomic History   ??? Marital status: Married     Spouse name: None   ??? Number of children: None   ??? Years of education: None   ??? Highest education level: None   Tobacco Use   ??? Smoking status: Former     Types: e-Cigarettes   ??? Smokeless tobacco: Never   Substance and Sexual Activity   ??? Alcohol use: Not Currently     Alcohol/week: 3.0 standard drinks     Types: 3 Glasses of wine per week   ??? Drug use: No   ??? Sexual activity: Not Currently     Social Determinants of Health     Financial Resource Strain: Low Risk    ??? Difficulty of Paying Living Expenses: Not hard at all   Food Insecurity: No Food Insecurity   ??? Worried About Running Out of Food in the Last Year: Never true   ??? Ran Out of Food in the Last Year: Never  true   Transportation Needs: No Transportation Needs   ??? Lack of Transportation (Medical): No   ??? Lack of Transportation (Non-Medical): No         Family History:     Family History   Problem Relation Age of Onset   ??? Bipolar disorder Mother    ??? Depression Mother          Allergies:     Allergies   Allergen Reactions   ??? Aztreonam Anaphylaxis, Hives, Nausea And Vomiting and Rash     fevers  Patient stated that he only vomited x1 with [place] in the past.????    fevers  Patient stated that he only vomited x1 with [place] in the past.????   ??? [place] [Aztreonam Lysine] Anaphylaxis   ??? Cefepime Itching, Nausea Only and Other (See Comments)     Headaches also   ??? Other Anaphylaxis and Other (See Comments)     Other reaction(s): Other (See Comments)  Bananas: itchy throat  Slo Bid record from [employer] states anaphylaxis.????Pt states this was from childhood and does not know reaction.  Bananas, causes itchy throat   ??? Slo-Bid 100 Anaphylaxis   ??? Tobramycin Tinnitus     From OSH record-documented as tinnitus but has received IV tobra with close monitoring.  Pt has received Tobramycin at [facility] since this allergy documented    From OSH record-documented as tinnitus but has received IV tobra with close monitoring.   ??? Banana Itching and Nausea And Vomiting         Home Medications:     Prior to Admission medications    Medication Sig Start Date End Date Taking? Authorizing Provider   albuterol (PROVENTIL HFA;VENTOLIN HFA) 90 mcg/actuation inhaler Inhale 2 puffs every six (6) hours as needed. 08/09/17   Merlinda Frederick, MD   albuterol 2.5 mg /3 mL (0.083 %) nebulizer solution Inhale 3 mL (2.5 mg total) by nebulization two (2) times a day. 12/19/20 12/19/21  Marlyne Beards, MD   amitriptyline (ELAVIL) 100 MG tablet TAKE ONE TABLET BY MOUTH AT BEDTIME  Patient taking differently: No sig reported 10/15/19   Marlyne Beards, MD   atorvastatin (LIPITOR) 20 MG tablet Take 20 mg by mouth nightly. 10/08/19   Historical Provider, MD   blood sugar diagnostic (GLUCOSE BLOOD) Strp by Other route Three (3) times a day with a meal. Rx sent to [facility] drug 01/04/20    Historical Provider, MD   blood-glucose meter kit Dispense meter that is preferred by patient's insurance company 03/22/21 05/26/22  Rica Koyanagi, MD   blood-glucose meter,continuous (DEXCOM G6 RECEIVER) Misc Use as directed 03/09/21   Harrie Foreman, MD   blood-glucose sensor (DEXCOM G6 SENSOR) Hardie Pulley Use sq as directed every 10 days 03/09/21   Harrie Foreman, MD   blood-glucose transmitter (DEXCOM G6 TRANSMITTER) Devi Use as directed, replace every 90 days 03/09/21   Harrie Foreman, MD   budesonide-formoteroL [facility]) 160-4.5 mcg/actuation inhaler Inhale 2 puffs Two (2) times a day. 06/02/21 06/02/22  Satira Sark, MD   cetirizine (ZYRTEC) 10 MG tablet Take 1 tablet (10 mg total) by mouth daily. 03/23/21 03/23/22  Satira Sark, MD   dornase alfa (PULMOZYME) 1 mg/mL nebulizer solution Inhale 2.5 mg daily. 12/09/20 12/09/21  Kathryne Hitch, MD   EASY TOUCH LANCING DEVICE Misc Use as directed. 11/25/18   Historical Provider, MD   EASY TOUCH TWIST LANCETS 30 gauge Misc Use as directed. 11/25/18  Historical Provider, MD   elexacaftor-tezacaftor-ivacaft (TRIKAFTA) 100-50-75 mg(d) /150 mg (n) tablet Take 2 Tablets (orange) by mouth in the morning and 1 tablet (blue) in the evening with fatty food 11/08/20   Marlyne Beards, MD   FLUoxetine 60 mg Tab Take 60 mg by mouth daily.  09/16/18   Historical Provider, MD   fluticasone propionate (FLONASE) 50 mcg/actuation nasal spray 2 sprays into each nostril daily. 06/16/21 06/16/22  Eugene Garnet, MD   FREESTYLE LIBRE 14 DAY SENSOR kit Use 3-4 times daily 12/19/20   Historical Provider, MD   gabapentin (NEURONTIN) 600 MG tablet Take 600 mg by mouth Three (3) times a day.    Historical Provider, MD   insulin ASPART (NOVOLOG FLEXPEN) 100 unit/mL (3 mL) injection pen Inject 0.3 mL (30 Units total) under the skin Three (3) times a day before meals. Plus sliding scale 06/16/21 07/16/21  Eugene Garnet, MD   insulin ASPART (NOVOLOG U-100 INSULIN ASPART) 100 unit/mL injection Subcutaneously infuse up to 150 units daily via insulin pump 05/03/21   Harrie Foreman, MD   insulin pump cart,automated,BT (OMNIPOD 5 G6 PODS, GEN 5,) Crtg 1 each by subcutaneous (via wearable injector) route every three (3) days. Change every 72 hour 04/13/21   Burgess Estelle, MD   lamoTRIgine (LAMICTAL) 200 MG tablet Take 400 mg by mouth daily.    Historical Provider, MD   lipase-protease-amylase (CREON) 36,000-114,000- 180,000 unit CpDR Take 6 caps by mouth with meals three times daily and 4 caps with snacks up to three times a day.    Historical Provider, MD   lipase-protease-amylase (CREON) 36,000-114,000- 180,000 unit CpDR Take 8 caps with meals TID and 4 caps with snacks up to three times a day. 06/16/21   Eugene Garnet, MD   lisinopriL (PRINIVIL,ZESTRIL) 10 MG tablet Take 1 tablet (10 mg total) by mouth daily. 02/01/19   Subhashini Appulingam Sellers, MD   melatonin 10 mg Tab Take 10 mg by mouth nightly.    Historical Provider, MD   montelukast (SINGULAIR) 10 mg tablet Take 1 tablet (10 mg total) by mouth nightly. 06/02/21   Satira Sark, MD   MVW COMPLETE FORMUL PROBIOTIC 40 billion cell -15 mg CpDR Take 1 capsule by mouth daily.  Patient taking differently: Take 1 capsule by mouth daily. 05/05/18   Donzetta Kohut, CPP   MVW Complete, pediatric multivit 61-D3-vit K, (MVW COMPLETE FORMULATION) 1,500-800 unit-mcg cap Take 2 capsules by mouth Two (2) times a day. 06/16/21 06/16/22  Eugene Garnet, MD   nebulizers Misc Use as directed with inhaled medications 04/17/18   Donzetta Kohut, CPP   omeprazole (PRILOSEC) 20 MG capsule Take 1 capsule (20 mg total) by mouth in the morning. 03/06/21 03/06/22  Satira Sark, MD   oxyCODONE (OXY-IR) 5 mg capsule Take 5 mg by mouth every four (4) hours as needed for pain. 5-10mg  prn pain (s/p back surgery)  Patient not taking: Reported on 06/08/2021    Historical Provider, MD   pen needle, diabetic (BD ULTRA-FINE NANO PEN NEEDLE) 32 gauge x 5/32 (4 mm) Ndle use up to 4 times daily 03/09/21   Harrie Foreman, MD   pramipexole (MIRAPEX) 0.125 MG tablet Take 0.25 mg by mouth every evening.     Historical Provider, MD   sodium chloride 7% 7 % Nebu Inhale 4 mL by nebulization Two (2) times a day. Increase to 4 times while taking antibiotics 10/12/19   Historical Provider,  MD   traZODone (DESYREL) 150 MG tablet Take 150 mg by mouth nightly. 01/18/21   Historical Provider, MD   XARELTO 20 mg tablet Take 20 mg by mouth daily. 09/16/18   Historical Provider, MD     Current Facility-Administered Medications   Medication Dose Route Frequency Provider Last Rate Last Admin   ??? dextrose 50 % in water (D50W) 50 % solution 12.5 g  12.5 g Intravenous Q10 Min PRN Gerre Scull, MD       ??? glucagon injection 1 mg  1 mg Intramuscular Once PRN Gerre Scull, MD       ??? glucose chewable tablet 16 g  16 g Oral Q10 Min PRN Gerre Scull, MD       ??? insulin glargine (LANTUS) injection 40 Units  40 Units Subcutaneous Nightly Gerre Scull, MD       ??? insulin lispro (HumaLOG) injection 0-20 Units  0-20 Units Subcutaneous ACHS Gerre Scull, MD       ??? insulin lispro (HumaLOG) injection 30 Units  30 Units Subcutaneous 3xd Meals Gerre Scull, MD       ??? MORPhine injection 2 mg  2 mg Intravenous Q4H PRN Gerre Scull, MD       ??? non formulary  200 mg Oral BID Gerre Scull, MD       ??? piperacillin-tazobactam (ZOSYN) IVPB (premix) 4.5 g  4.5 g Intravenous Q6H Gerre Scull, MD       ??? tobramycin (NEBCIN) 3 mg/kg in sodium chloride (NS) 0.9 % 50 mL IVPB  3 mg/kg (Dosing Weight) Intravenous Q12H Gerre Scull, MD         Current Outpatient Medications   Medication Sig Dispense Refill   ??? albuterol (PROVENTIL HFA;VENTOLIN HFA) 90 mcg/actuation inhaler Inhale 2 puffs every six (6) hours as needed. 1 Inhaler 1   ??? albuterol 2.5 mg /3 mL (0.083 %) nebulizer solution Inhale 3 mL (2.5 mg total) by nebulization two (2) times a day. 540 mL 3   ??? amitriptyline (ELAVIL) 100 MG tablet TAKE ONE TABLET BY MOUTH AT BEDTIME (Patient taking differently: No sig reported) 30 tablet 1   ??? atorvastatin (LIPITOR) 20 MG tablet Take 20 mg by mouth nightly.     ??? blood sugar diagnostic (GLUCOSE BLOOD) Strp by Other route Three (3) times a day with a meal. Rx sent to [facility] 01/04/20     ??? blood-glucose meter kit Dispense meter that is preferred by patient's insurance company 1 each 0   ??? blood-glucose meter,continuous (DEXCOM G6 RECEIVER) Misc Use as directed 1 each 0   ??? blood-glucose sensor (DEXCOM G6 SENSOR) Devi Use sq as directed every 10 days 9 each 11   ??? blood-glucose transmitter (DEXCOM G6 TRANSMITTER) Devi Use as directed, replace every 90 days 1 each 11   ??? budesonide-formoteroL (SYMBICORT) 160-4.5 mcg/actuation inhaler Inhale 2 puffs Two (2) times a day. 30.6 g 3   ??? cetirizine (ZYRTEC) 10 MG tablet Take 1 tablet (10 mg total) by mouth daily. 30 tablet 11   ??? dornase alfa (PULMOZYME) 1 mg/mL nebulizer solution Inhale 2.5 mg daily. 450 mL 3   ??? EASY TOUCH LANCING DEVICE Misc Use as directed.     ??? EASY TOUCH TWIST LANCETS 30 gauge Misc Use as directed.     ??? elexacaftor-tezacaftor-ivacaft (TRIKAFTA) 100-50-75 mg(d) /150 mg (n) tablet Take 2 Tablets (orange) by mouth in the morning and 1 tablet (blue) in the evening with fatty food 84 tablet 6   ???  FLUoxetine 60 mg Tab Take 60 mg by mouth daily.      ??? fluticasone propionate (FLONASE) 50 mcg/actuation nasal spray 2 sprays into each nostril daily.     ??? FREESTYLE LIBRE 14 DAY SENSOR kit Use 3-4 times daily     ??? gabapentin (NEURONTIN) 600 MG tablet Take 600 mg by mouth Three (3) times a day.     ??? insulin ASPART (NOVOLOG FLEXPEN) 100 unit/mL (3 mL) injection pen Inject 0.3 mL (30 Units total) under the skin Three (3) times a day before meals. Plus sliding scale 27 mL 0   ??? insulin ASPART (NOVOLOG U-100 INSULIN ASPART) 100 unit/mL injection Subcutaneously infuse up to 150 units daily via insulin pump 150 mL 11   ??? insulin pump cart,automated,BT (OMNIPOD 5 G6 PODS, GEN 5,) Crtg 1 each by subcutaneous (via wearable injector) route every three (3) days. Change every 72 hour 10 each 12   ??? lamoTRIgine (LAMICTAL) 200 MG tablet Take 400 mg by mouth daily.     ??? lipase-protease-amylase (CREON) 36,000-114,000- 180,000 unit CpDR Take 6 caps by mouth with meals three times daily and 4 caps with snacks up to three times a day.     ??? lipase-protease-amylase (CREON) 36,000-114,000- 180,000 unit CpDR Take 8 caps with meals TID and 4 caps with snacks up to three times a day. 1200 capsule 11   ??? lisinopriL (PRINIVIL,ZESTRIL) 10 MG tablet Take 1 tablet (10 mg total) by mouth daily. 30 tablet 0   ??? melatonin 10 mg Tab Take 10 mg by mouth nightly.     ??? montelukast (SINGULAIR) 10 mg tablet Take 1 tablet (10 mg total) by mouth nightly. 90 tablet 3   ??? MVW COMPLETE FORMUL PROBIOTIC 40 billion cell -15 mg CpDR Take 1 capsule by mouth daily. (Patient taking differently: Take 1 capsule by mouth daily.) 90 capsule 3   ??? MVW Complete, pediatric multivit 61-D3-vit K, (MVW COMPLETE FORMULATION) 1,500-800 unit-mcg cap Take 2 capsules by mouth Two (2) times a day.     ??? nebulizers Misc Use as directed with inhaled medications 4 each 3   ??? omeprazole (PRILOSEC) 20 MG capsule Take 1 capsule (20 mg total) by mouth in the morning. 90 capsule 3   ??? oxyCODONE (OXY-IR) 5 mg capsule Take 5 mg by mouth every four (4) hours as needed for pain. 5-10mg  prn pain (s/p back surgery) (Patient not taking: Reported on 06/08/2021)     ??? pen needle, diabetic (BD ULTRA-FINE NANO PEN NEEDLE) 32 gauge x 5/32 (4 mm) Ndle use up to 4 times daily 400 each 12   ??? pramipexole (MIRAPEX) 0.125 MG tablet Take 0.25 mg by mouth every evening.      ??? sodium chloride 7% 7 % Nebu Inhale 4 mL by nebulization Two (2) times a day. Increase to 4 times while taking antibiotics     ??? traZODone (DESYREL) 150 MG tablet Take 150 mg by mouth nightly.     ??? XARELTO 20 mg tablet Take 20 mg by mouth daily.           Objective:     Vital Signs:  Vitals:    07/29/21 1348 07/29/21 1349 07/29/21 1507 07/29/21 1800   BP:  170/111 129/95 133/88   Pulse: 105 102 98 81   Resp:  16 23 24    Temp:  37.1 ??C (98.8 ??F)     TempSrc:  Oral     SpO2: 97% 98% 96% 91%  There is no height or weight on file to calculate BMI.    Physical Exam:   Constitutional: well appearing, in no distress  Eyes: EOMI, no scleral icterus  HENT: moist mucous membranes, trachea midline  Cardiovascular: normal rate, regualr rhythm, no lower extremity edema  Respiratory: CTAB with good air movement bilaterally  Gastrointestinal: bowel sounds present, abdomen non-tender, non-distended  Skin: normal turgor, no jaundice  Neurological: normal speech, no facial asymetry  Psychiatric: A&OX4, normal affect      Lab Results   Component Value Date NA 133 (L) 07/29/2021    K 4.2 07/29/2021    CL 101 07/29/2021    CO2 21.0 07/29/2021    BUN <5 (L) 07/29/2021    CREATININE 0.63 07/29/2021    GLU 400 (H) 07/29/2021    CALCIUM 8.9 07/29/2021    MG 1.9 12/09/2020    PHOS 3.9 08/12/2018       Lab Results   Component Value Date    BILITOT 0.4 07/29/2021    BILIDIR 0.20 07/04/2020    PROT 7.4 07/29/2021    ALBUMIN 4.0 07/29/2021    ALT 58 (H) 07/29/2021    AST 33 07/29/2021    ALKPHOS 124 (H) 07/29/2021       Lab Results   Component Value Date    WBC 9.4 07/29/2021    HGB 15.1 07/29/2021    HCT 45.3 07/29/2021    PLT 310 07/29/2021       Lab Results   Component Value Date    PT 14.3 (H) 06/12/2021    INR 1.25 06/12/2021         Micro:  Sputum culture pending    Imaging:  CXR 12/17  Small right pleural effusion at the costophrenic angle    Assessment & Plan:     Vondell Babers is a 35 y.o. male with a  PMH of CF who presented with cough, DOE, and fatigue and was admitted for a CF exacerbation. RVP was negative, so viral URI is less likely. CXR showed small bibasilar infiltrates favored to be atelectasis, so PNA is less likely but still possible. PE is unlikely given he is on life-long Xarelto, is not hypoxemic, tachycardic, and denies hemoptysis.    Cystic Fibrosis  - follows with pulmonology here  - continue home montelukast 10mg  daily, cetirizine 10mg  daily, albuterol nebs, hypertonic saline nebs (QID since on antibiotics), Flonase BID, Bro Ellipta 200-34mcg daily (substitute for Symbicort, not on formulary), dornase alfa 2.5mg  daily  - start IV tobramycin (with pharmacy consult for dosing) and Zosyn 4.5mg  QID  - follow up sputum culture (previous cultures show susceptibility to above antibiotics)  - day team to consider pulmonology consult in the morning  - 2mg  IV morphine Q4H PRN chest pain, can deescalate to oral regimen upon improvement  - trend daily BMP to monitor renal function given antibiotic therapy    DM  - patient uses Omnipod at home  - while admitted, will start Lantus 40 units nightly, aspart 30 units with meals plus PPI (patient reports this regimen worked well for him during his last admission  - carb consistent diet    Pancreatic insufficiency  - continue home Creon (8 capsules with meals, 4 capsules with snacks)    Mood disorder  - continue home fluoxetine 60mg  daily, amitriptyline 100mg  daily, and lamotrigine 400mg  daily    History of DVT  - continue home Xarelto 20mg  daily    GERD  - continue home pantoprazole 20mg  daily  HTN  - continue home lisinopril 10mg  daily    RLS  - continue home mirapex 0.25mg  nightly    Chronic back pain  - continue gabapentin 600mg  TID    Gerre Scull, MD PhD  Hospital Medicine (overnight admissions)  07/29/2021 6:50 PM

## 2021-07-30 NOTE — Unmapped (Signed)
Pulmonary Consult Service  Consult H&P      Primary Service:   Medicine - Hospitalist  Primary Service Attending:  Pixie Casino, MD  Reason for Consult:   CF exacerbation    ASSESSMENT and PLAN     Christian Young is a 35 y.o. male with history of CF ([id number] and [phone] insertion) on trikafta,??CFRD on insulin, history of DVT (on xarelto currently),??with recent baseline lung function of FEV1 ~72% who presented with dizziness, stomach pain, fatigue & increased cough despite starting doxycycline outpatient. He was admitted for treatment of CF exacerbation; this is his 3rd admission in 6 mo for the same. However, he does not have increased sputum production or dyspnea. RPP negative. CRP is only 5. CXR is unchanged from previously - what is reported as small R effusion I suspect is actually a small area of persistently atelectatic lung based on prior imaging. His main concern as described to me today was vertigo - which could be 2/2 post-viral labyrinthitis. Serum cortisol levels are normal, no recent steroids. Normal IgE in June. He did have an episode of hypotension overnight which improved with fluids, iso ?polypharmacy and associated with a rapid rise in his WBC to 16.7 and Cr bump to 1.79, now improving.     1. CF bronchiectasis exacerbation: Increased cough, but minimal sputum production even with increased airway clearance. Initially on zosyn + tobramycin for Hx MSSA & mucoid PSA (and pan-sensitive Steno x1 in August 2022), but transitioned to regimen below given renal injury & hx AIN on zosyn previously.   - antibiotics: ceftazidime + tobramycin nebs + doxycycline  - Please obtain CF sputum culture + AFB culture  - f/u BCx obtained during RR 12/18 early AM  - Consider CT chest no improvement w/ abx above, last had dedicated chest imaging in 2019  - Please obtain flow volume loop 12/19 or early this week  - Aggressive airway clearance with: Albuterol followed by 7% HTS, with Vest + aerobika TID   - Continue pulmozyme daily, and formulary substitution for home symbicort  - Weekly labs: CRP, ESR  - If clinical status, PFTs & sputum culture are reassuring, would favor rapid transition to oral antibiotics & discharge home.   - Please engage PT      2. Chronic pulmonary CF management  - Continue Trikafta   - continue home Pulmozyme     3. Chronic CF GI/pancreatic management  - recommend aggressive bowel regimen  - please restart home creon  - f/u nutrition recommendations  - Home CF vitamin  - For INR >1.4, please give vitamin K 5mg  PO daily x 5 days (recognizing that xarelto has some effect on his INR)  - Encourage walking, out of bed, activity    4. CFRD - Last A1c 9 in Oct 2022  - Continue long acting 44 u qPM & short acting 32 u tid + SSI  - Low threshold to engage endocrine if sugars remain difficult to control    5. Hx DVT  - Continue home xarelto     Christian Young was seen, examined and discussed with  Dr. Gerre Couch   Thank you for involving [name] in his care. We look forward to following with you.  Recommendations were communicated to the primary team at 9:50 AM  Please don't hesitate to page [name] with any questions or concerns at [phone] (pulmonary consult fellow).    Christian Young E. Talmadge Coventry, MD  Pulmonary & Critical Care Fellow  HISTORY:     History of Present Illness:  Christian Young is a 35 y.o. male with with history of CF [phone] and [phone] insertion) on trikafta,??CFRD on insulin, history of DVT (on xarelto currently),??with recent baseline lung function of FEV1 ~72% who presented with dizziness, stomach pain, fatigue & increased cough despite starting doxycycline outpatient. He was admitted for treatment of CF exacerbation; this is his 3rd admission in 6 mo for the same. He gives a history of exposure to respiratory virus over thanksgiving. He began feeling poorly on Wednesday of last week with increased dizziness, fatigue, and worsened cough. He is not bringing up sputum despite TID airway clearance. States that he is using his trikafta. No hemoptysis. PCP started him on doxycycline after which he had abdominal pain (no change in stools) and nausea. He noted worsened vertigo on Friday with dizziness on standing like the room was moving around me. When symptoms did not improve, he presented to [facility] ED for admission. This differs slightly to the hx he provided to admitting physician last night.  Typically exacerbations present with worsening purulent sputum & cough.   Non smoker, EtOH likely >3-4 drinks/wk, no longer vaping.   Past Medical History:  Past Medical History:   Diagnosis Date   ??? Anxiety    ??? Chronic pain disorder    ??? Cystic fibrosis (CMS-HCC)    ??? Depression    ??? Hypertension    ??? Lumbar radiculopathy 10/26/2020   ??? Nonproductive cough 04/05/2018     Past Surgical History:   Procedure Laterality Date   ??? IR INSERT PORT AGE GREATER THAN 5 YRS  03/27/2019    IR INSERT PORT AGE GREATER THAN 5 YRS 03/27/2019 Rush Barer, MD IMG VIR [facility]   ??? PR REMOVAL OF LUNG,LOBECTOMY Right 03/29/2017    Procedure: REMOVAL OF LUNG, OTHER THAN PNEUMONECTOMY; SINGLE LOBE (LOBECTOMY);  Surgeon: Cherie Dark, MD;  Location: MAIN OR [facility];  Service: Thoracic       Other History:  Family History   Problem Relation Age of Onset   ??? Bipolar disorder Mother    ??? Depression Mother      Social History     Socioeconomic History   ??? Marital status: Married   Tobacco Use   ??? Smoking status: Former     Types: e-Cigarettes   ??? Smokeless tobacco: Never   Vaping Use   ??? Vaping Use: Former   Substance and Sexual Activity   ??? Alcohol use: Not Currently     Alcohol/week: 3.0 standard drinks     Types: 3 Glasses of wine per week   ??? Drug use: No   ??? Sexual activity: Yes     Social Determinants of Health     Financial Resource Strain: Low Risk    ??? Difficulty of Paying Living Expenses: Not hard at all   Food Insecurity: No Food Insecurity   ??? Worried About Programme researcher, broadcasting/film/video in the Last Year: Never true   ??? Ran Out of Food in the Last Year: Never true   Transportation Needs: No Transportation Needs   ??? Lack of Transportation (Medical): No   ??? Lack of Transportation (Non-Medical): No       Home Medications:  No current facility-administered medications on file prior to encounter.     Current Outpatient Medications on File Prior to Encounter   Medication Sig Dispense Refill   ??? albuterol (PROVENTIL HFA;VENTOLIN HFA) 90 mcg/actuation inhaler Inhale 2 puffs every  six (6) hours as needed. 1 Inhaler 1   ??? albuterol 2.5 mg /3 mL (0.083 %) nebulizer solution Inhale 3 mL (2.5 mg total) by nebulization two (2) times a day. 540 mL 3   ??? amitriptyline (ELAVIL) 100 MG tablet TAKE ONE TABLET BY MOUTH AT BEDTIME (Patient taking differently: No sig reported) 30 tablet 1   ??? atorvastatin (LIPITOR) 20 MG tablet Take 20 mg by mouth nightly.     ??? blood sugar diagnostic (GLUCOSE BLOOD) Strp by Other route Three (3) times a day with a meal. Rx sent to [facility] 01/04/20     ??? blood-glucose meter kit Dispense meter that is preferred by patient's insurance company 1 each 0   ??? blood-glucose meter,continuous (DEXCOM G6 RECEIVER) Misc Use as directed 1 each 0   ??? blood-glucose sensor (DEXCOM G6 SENSOR) Devi Use sq as directed every 10 days 9 each 11   ??? blood-glucose transmitter (DEXCOM G6 TRANSMITTER) Devi Use as directed, replace every 90 days 1 each 11   ??? budesonide-formoteroL (SYMBICORT) 160-4.5 mcg/actuation inhaler Inhale 2 puffs Two (2) times a day. 30.6 g 3   ??? cetirizine (ZYRTEC) 10 MG tablet Take 1 tablet (10 mg total) by mouth daily. 30 tablet 11   ??? codeine-guaifenesin (GUAIFENESIN AC) 10-100 mg/5 mL liquid Take 5 mL by mouth Three (3) times a day as needed for cough.     ??? dornase alfa (PULMOZYME) 1 mg/mL nebulizer solution Inhale 2.5 mg daily. 450 mL 3   ??? doxycycline (VIBRA-TABS) 100 MG tablet Take 100 mg by mouth Two (2) times a day.     ??? EASY TOUCH LANCING DEVICE Misc Use as directed.     ??? EASY TOUCH TWIST LANCETS 30 gauge Misc Use as directed.     ??? elexacaftor-tezacaftor-ivacaft (TRIKAFTA) 100-50-75 mg(d) /150 mg (n) tablet Take 2 Tablets (orange) by mouth in the morning and 1 tablet (blue) in the evening with fatty food 84 tablet 6   ??? FLUoxetine 60 mg Tab Take 60 mg by mouth daily.      ??? fluticasone propionate (FLONASE) 50 mcg/actuation nasal spray 2 sprays into each nostril daily.     ??? FREESTYLE LIBRE 14 DAY SENSOR kit Use 3-4 times daily     ??? gabapentin (NEURONTIN) 600 MG tablet Take 600 mg by mouth Three (3) times a day.     ??? insulin ASPART (NOVOLOG FLEXPEN) 100 unit/mL (3 mL) injection pen Inject 0.3 mL (30 Units total) under the skin Three (3) times a day before meals. Plus sliding scale 27 mL 0   ??? insulin ASPART (NOVOLOG U-100 INSULIN ASPART) 100 unit/mL injection Subcutaneously infuse up to 150 units daily via insulin pump 150 mL 11   ??? insulin pump cart,automated,BT (OMNIPOD 5 G6 PODS, GEN 5,) Crtg 1 each by subcutaneous (via wearable injector) route every three (3) days. Change every 72 hour 10 each 12   ??? lamoTRIgine (LAMICTAL) 200 MG tablet Take 400 mg by mouth daily.     ??? lipase-protease-amylase (CREON) 36,000-114,000- 180,000 unit CpDR Take 6 caps by mouth with meals three times daily and 4 caps with snacks up to three times a day.     ??? lipase-protease-amylase (CREON) 36,000-114,000- 180,000 unit CpDR Take 8 caps with meals TID and 4 caps with snacks up to three times a day. 1200 capsule 11   ??? lisinopriL (PRINIVIL,ZESTRIL) 10 MG tablet Take 1 tablet (10 mg total) by mouth daily. 30 tablet 0   ??? melatonin  10 mg Tab Take 10 mg by mouth nightly.     ??? montelukast (SINGULAIR) 10 mg tablet Take 1 tablet (10 mg total) by mouth nightly. 90 tablet 3   ??? MVW COMPLETE FORMUL PROBIOTIC 40 billion cell -15 mg CpDR Take 1 capsule by mouth daily. (Patient taking differently: Take 1 capsule by mouth daily.) 90 capsule 3   ??? MVW Complete, pediatric multivit 61-D3-vit K, (MVW COMPLETE FORMULATION) 1,500-800 unit-mcg cap Take 2 capsules by mouth Two (2) times a day.     ??? nebulizers Misc Use as directed with inhaled medications 4 each 3   ??? omeprazole (PRILOSEC) 20 MG capsule Take 1 capsule (20 mg total) by mouth in the morning. 90 capsule 3   ??? oxyCODONE (OXY-IR) 5 mg capsule Take 5 mg by mouth every four (4) hours as needed for pain. 5-10mg  prn pain (s/p back surgery) (Patient not taking: Reported on 06/08/2021)     ??? pen needle, diabetic (BD ULTRA-FINE NANO PEN NEEDLE) 32 gauge x 5/32 (4 mm) Ndle use up to 4 times daily 400 each 12   ??? pramipexole (MIRAPEX) 0.125 MG tablet Take 0.25 mg by mouth every evening.      ??? sodium chloride 7% 7 % Nebu Inhale 4 mL by nebulization two (2) times a day.     ??? traZODone (DESYREL) 150 MG tablet Take 150 mg by mouth nightly.     ??? XARELTO 20 mg tablet Take 20 mg by mouth daily.         In-Hospital Medications:  Scheduled:  ??? albuterol  2.5 mg Nebulization BID   ??? amitriptyline  100 mg Oral Nightly   ??? atorvastatin  10 mg Oral Nightly   ??? cetirizine  10 mg Oral Daily   ??? DAPTOmycin  6 mg/kg Intravenous Q24H   ??? dornase alfa  2.5 mg Inhalation Daily   ??? elexacaftor-tezacaftor-ivacaft  2 tablet Oral Q AM    And   ??? elexacaftor-tezacaftor-ivacaft  1 tablet Oral Q PM   ??? FLUoxetine  60 mg Oral Daily   ??? fluticasone furoate-vilanteroL  1 puff Inhalation Daily (RT)   ??? fluticasone propionate  2 spray Each Nare Daily   ??? gabapentin  600 mg Oral TID   ??? insulin glargine  40 Units Subcutaneous Nightly   ??? insulin lispro  0-20 Units Subcutaneous ACHS   ??? insulin lispro  30 Units Subcutaneous 3xd Meals   ??? lactated ringers  1,000 mL Intravenous Once   ??? lamoTRIgine  400 mg Oral Daily   ??? lidocaine  1 patch Transdermal Daily   ??? melatonin  10.5 mg Oral Nightly   ??? montelukast  10 mg Oral Nightly   ??? pancrelipase (Lip-Prot-Amyl)  12 capsule Oral 3xd Meals   ??? pantoprazole  20 mg Oral Daily   ??? MVW Complete (pediatric multivit 61-D3-vit K)  2 capsule Oral BID   ??? piperacillin-tazobactam (ZOSYN) IV (intermittent)  4.5 g Intravenous Q6H   ??? pramipexole  0.25 mg Oral Q PM   ??? rivaroxaban  20 mg Oral Daily   ??? sodium chloride 7%  4 mL Nebulization QID   ??? [START ON 07/31/2021] tobramycin (NEBCIN) IVPB (conventional dosing)  630 mg Intravenous Q48H   ??? traZODone  150 mg Oral Nightly     Continuous Infusions:    PRN Medications:  dextrose in water, glucagon, glucose, oxyCODONE, pancrelipase (Lip-Prot-Amyl)    Allergies:  Allergies as of 07/29/2021 - Reviewed 07/29/2021   Allergen Reaction Noted   ???  Aztreonam Anaphylaxis, Hives, Nausea And Vomiting, and Rash 09/06/2015   ??? [place] [aztreonam lysine] Anaphylaxis 12/27/2016   ??? Cefepime Itching, Nausea Only, and Other (See Comments) 09/06/2015   ??? Other Anaphylaxis and Other (See Comments) 09/06/2015   ??? Slo-bid 100 Anaphylaxis 06/20/2017   ??? Tobramycin Tinnitus 09/06/2015   ??? Banana Itching and Nausea And Vomiting 07/29/2016         PHYSICAL EXAM:   BP 117/68  - Pulse 88  - Temp 35.8 ??C (96.5 ??F)  - Resp 20  - Ht 182.9 cm (6')  - Wt (!) 108.9 kg (240 lb)  - SpO2 100%  - BMI 32.55 kg/m??   General: Obese, young gentleman sitting up in bed, appears drowsy and a little unfocused  Eyes: PERRL. Anicteric sclera.   ENT:   Nasal mucosa clear. No polyps seen. No crusting.  Lymph: No cervical or supraclavicular lymphadenopathy.  Respiratory: Comfortable WOB on RA, +L basilar crackles, no wheeze, no squeaks  Cardiovascular: RRR, +s1/s2, Normal P2, no RV heave, No MRG, No edema  Abdomen: NABS, Soft, Non-tender, non-distended.  Musculoskeletal/extremities: Normal muscle tone and strength. No cyanosis. No clubbing.   Skin: No skin rashes on clothed exam  Neuro: Alert and oriented x 3. CN grossly intact. No focal neurological deficits      LABORATORY and RADIOLOGY DATA:     Pertinent Laboratory Data:  Lab Results   Component Value Date    WBC 16.7 (H) 07/30/2021    HGB 13.6 07/30/2021    HCT 41.6 07/30/2021    PLT 262 07/30/2021       Lab Results   Component Value Date    NA 136 07/30/2021    NA 135 07/30/2021    K 4.2 07/30/2021    K 3.8 07/30/2021    CL 100 07/30/2021    CO2 26.0 07/30/2021    BUN 24 (H) 07/30/2021    CREATININE 1.79 (H) 07/30/2021    GLU 210 (H) 07/30/2021    CALCIUM 9.1 07/30/2021    MG 1.5 (L) 07/30/2021    PHOS 4.6 07/30/2021       Lab Results   Component Value Date    BILITOT 0.5 07/30/2021    BILIDIR 0.20 07/30/2021    PROT 6.4 07/30/2021    ALBUMIN 3.5 07/30/2021    ALT 60 (H) 07/30/2021    AST 35 (H) 07/30/2021    ALKPHOS 113 07/30/2021       Lab Results   Component Value Date    LABPROT 11.8 01/18/2020    INR 1.25 06/12/2021       Pertinent Micro Data:  Microbiology Results (last day)     Procedure Component Value Date/Time Date/Time    MRSA Screen [[id number]] Collected: 07/30/21 0839    Lab Status: In process Specimen: Nares from Nose Updated: 07/30/21 0915    Blood Culture #2 [[id number]] Collected: 07/30/21 0704    Lab Status: In process Specimen: Blood from 1 Peripheral Draw Updated: 07/30/21 0723    Blood Culture #1 [[id number]] Collected: 07/30/21 0704    Lab Status: In process Specimen: Blood from 1 Peripheral Draw Updated: 07/30/21 0723    CF Sputum/ CF Sinus Culture [[id number]]     Lab Status: No result Specimen: Sputum, CF     Lower Respiratory Culture [[id number]]     Lab Status: No result Specimen: SPUTUM EXPECTORATED     Respiratory Pathogen Panel with COVID-19 [[id number]]  (Normal) Collected: 07/29/21 1505    Lab Status: Final result  Specimen: Nasopharyngeal Swab Updated: 07/29/21 1627     Adenovirus Not Detected     Coronavirus HKU1 Not Detected     Coronavirus NL63 Not Detected     Coronavirus 229E Not Detected     Coronavirus OC43 PCR Not Detected     Metapneumovirus Not Detected     Rhinovirus/Enterovirus Not Detected     Influenza A Not Detected     Influenza B Not Detected     Parainfluenza 1 Not Detected     Parainfluenza 2 Not Detected     Parainfluenza 3 Not Detected     Parainfluenza 4 Not Detected     RSV Not Detected     Bordetella pertussis Not Detected Comment: If B. pertussis/parapertussis infection is suspected, the Bordetella pertussis/parapertussis Qualitative PCR test should be ordered.        Bordetella parapertussis Not Detected     Chlamydophila (Chlamydia) pneumoniae Not Detected     Mycoplasma pneumoniae Not Detected     SARS-CoV-2 PCR Not Detected    Narrative:      This result was obtained using the FDA-cleared [employer] Respiratory 2.1 Panel. Performance characteristics have been established and verified by the Clinical Molecular Microbiology Laboratory, [facility]. This assay does not distinguish between rhinovirus and enterovirus. Lower respiratory specimens will not be tested for Bordetella pertussis/parapertussis. For nasopharyngeal swabs, cross-reactivity may occur between B. pertussis and non-pertussis Bordetella species. All positive B. pertussis results will be automatically confirmed using our in-house PCR assay. Ct (cycle threshold) values for SARS-CoV-2 are not available for this test.    Lower Respiratory Culture [[id number]]     Lab Status: No result Specimen: Sputum from Lung, Combined            Pertinent Imaging Data:  All pertinent imaging studies have been reviewed as part of this encounter.

## 2021-07-30 NOTE — Unmapped (Addendum)
Medicine Daily Progress Note    Assessment/Plan:  Active Problems:    Mood disorder (CMS-HCC)    Pancreatic insufficiency due to cystic fibrosis (CMS-HCC)    Bronchiectasis (CMS-HCC)    Chronic deep vein thrombosis (DVT) of lower extremity (CMS-HCC)    Cystic fibrosis with pulmonary exacerbation (CMS-HCC)    Cystic fibrosis exacerbation (CMS-HCC)    AKI (acute kidney injury) (CMS-HCC)  Resolved Problems:    * No resolved hospital problems. *           Christian Young is a 35 y.o. male with CF,  with CF associated diabetes presenting with cough and fatigue, hospitalization complicated by hypotension and AKI.    Sepsis with septic shock, episode of hypotension-meets sepsis criteria with hypothermia, leukocytosis, elevated lactate, hypotension.  It is possible that the episode of hypotension was due to oversedation from medications but will be treated as sepsis will cultures pending.   -Status post 2 L of fluid, repeat exam with cap refill less than 2 seconds, mentating appropriately    -Obtain blood cultures   -Repeat portable chest x-ray to look for interval changes, CXR stable   -Gave dose of daptomycin (will stop if blood cx neg), discussed with pulmonology, will switch antibiotics to ceftazidime and Doxy  -With right upper quadrant pain, ultrasound obtained with enlarged gallbladder but out frank signs of cholecystitis clinically monitor exam and consider HIDA scan, if worsening right upper quadrant pain    CF exacerbation-past cultures have grown MSSA and mucoid and smooth Pseudomonas aeruginosa (S to ceftaz, pip/tazo, tobramycin, as well as pansensitive stenotrophomonas.  Last hospitalized in November.  Last FEV1 11/03 68.   -- continue home montelukast 10mg  daily, cetirizine 10mg  daily, albuterol nebs, hypertonic saline nebs (QID since on antibiotics), Flonase BID, Bro Ellipta 200-65mcg daily (substitute for Symbicort, not on formulary), dornase alfa 2.5mg  daily  -Appreciate pulmonology's recommendations  -Will change antibiotics to ceftazidime, doxycycline, and inhaled tobramycin per pulmonology's recommendations (D1 12/17)   -Airway clearance with aerobika or chest vest   -CF sputum culture  -Vitamin K repletion while on IV antibiotics as >2  -We will need PFTs in coming week   -As needed oxycodone/tylenol/lidocaine and then for chest wall pain    AKI, history of AIN-Baseline creatinine around .6-.7.  History of AIN while on paper to still on tazobactam.  Creatinine increased from 0.63>1.79 in setting of hypotension.  Creatinine now improving at 1.42.  Ultrasound without signs of hydronephrosis.  -Stopped IV tobramycin and Zosyn as above  -Avoid nephrotoxic agents  -Urinalysis ordered  -Continue supportive care    Chronic Medical Conditions:   Pancreatic insufficiency  - continue home Creon (8 capsules with meals, 4 capsules with snacks)  ??  CF associated diabetes- Last HbA1C 9   -Uses OmniPod at home  -Lantus 44 units nightly, aspart 32 units 3 times daily AC    Chronic Medical Conditions:  Mood disorder- continue home fluo does not take anything at xetine 60mg  daily, amitriptyline 100mg  daily, and lamotrigine 400mg  daily  History of DVT- continue home Xarelt I would give her a dose of o 20mg  daily  GERD- continue home pantoprazole 20mg  daily  HTN- Hold lisinopril with hypotension and AKI   RLS- continue home mirapex 0.25mg  nightly, decreased trazodone to 100mg  considering low BP overnight  Chronic back pain- continue gabapentin 600mg  TID  ??  DVT ppx: Home agent  For status  Bowel regimen: Miralax and senna   ___________________________________________________________________    Subjective:  RRT  for hypotension, this morning, feeling better once awoken.  Was hypotensive when asleep but blood pressure has now resolved.  Reports some chest wall pain which is frequently as a CF exacerbation.  Does have some shortness of breath.  Is not having significant sputum production.  Is having some right upper quadrant tenderness to palpation.  He does report that he has some chronic abdominal pain but this is more notable than normal.  He is also feeling dizziness and nausea.    Labs/Studies:  Labs and Studies from the last 24hrs per EMR and Reviewed      Objective:  Temp:  [35.6 ??C (96.1 ??F)-36.7 ??C (98 ??F)] 36.2 ??C (97.2 ??F)  Heart Rate:  [71-101] 94  SpO2 Pulse:  [81] 81  Resp:  [18-24] 18  BP: (60-140)/(35-93) 123/80  SpO2:  [91 %-100 %] 100 %    GEN: NAD, lying in bed, alert, oriented, no acute distress  EYES: EOMI, no scleral icterus or injection  ENT: MMM  CV: RRR, no murmurs heard, port site in left upper chest with minimal erythema, no discharge or tenderness around site   PULM: Faint crackles in lower lung fields, otherwise clear to auscultation, no wheezing, normal work of breathing on nasal cannula  ABD: soft, mild right upper quadrant tenderness to palpation, negative Murphy sign   EXT: No edema, warm extremities, cap refill less than 2 seconds

## 2021-07-30 NOTE — Unmapped (Signed)
Aminoglycoside Therapeutic Monitoring Pharmacy Note    Christian Young is a 35 y.o. male continuing tobramycin. Date of therapy initiation: 07/29/21    Indication: pneumonia    Prior Dosing Information: Current regimen 250 mg q12h      Goals:  Therapeutic Drug Levels  ?? Trough level: tobramycin < 1 mcg/mL  ?? Peak level: tobramycin 10 - 14 mcg/mL    Additional Clinical Monitoring/Outcomes  Renal function, volume status (intake and output)    Results:   ?? Trough level: 5.8 mg/L, drawn 5h to start of infusion (Extrapolated trough: 4.8 mg/L)   ?? Peak level: 7.1 mg/L, drawn 1.3h from end of infusion (Extrapolated Peak: 7.5 mg/L)    Wt Readings from Last 1 Encounters:   07/29/21 (!) 108.9 kg (240 lb)     Lab Results   Component Value Date    CREATININE 1.79 (H) 07/30/2021       Pharmacokinetic Considerations and Significant Drug Interactions:  ??? Adult (calculated on 07/30/21): Vd = 88.4 L, ke = 0.0391 hr-1  ??? Concurrent nephrotoxic meds: zosyn    Assessment/Plan:  Recommendation(s)  ??? Change current regimen to 630 mg q 48h due to AKI  ??? Estimated peak and trough on recommended regimen: peak = 8.3 mg/L, trough = 1.3 mg/L    Follow-up  ??? Level due: at 2 hrs and 8 hrs after first or second dose  ??? A pharmacist will continue to monitor and order levels as appropriate    Please page service pharmacist with questions/clarifications.    Chucky May, PharmD

## 2021-07-31 LAB — CBC W/ AUTO DIFF
BASOPHILS ABSOLUTE COUNT: 0.1 10*9/L (ref 0.0–0.1)
BASOPHILS RELATIVE PERCENT: 1 %
EOSINOPHILS ABSOLUTE COUNT: 0.1 10*9/L (ref 0.0–0.5)
EOSINOPHILS RELATIVE PERCENT: 2.2 %
HEMATOCRIT: 35.6 % — ABNORMAL LOW (ref 39.0–48.0)
HEMOGLOBIN: 11.9 g/dL — ABNORMAL LOW (ref 12.9–16.5)
LYMPHOCYTES ABSOLUTE COUNT: 0.9 10*9/L — ABNORMAL LOW (ref 1.1–3.6)
LYMPHOCYTES RELATIVE PERCENT: 16.8 %
MEAN CORPUSCULAR HEMOGLOBIN CONC: 33.6 g/dL (ref 32.0–36.0)
MEAN CORPUSCULAR HEMOGLOBIN: 27.4 pg (ref 25.9–32.4)
MEAN CORPUSCULAR VOLUME: 81.5 fL (ref 77.6–95.7)
MEAN PLATELET VOLUME: 8.5 fL (ref 6.8–10.7)
MONOCYTES ABSOLUTE COUNT: 0.6 10*9/L (ref 0.3–0.8)
MONOCYTES RELATIVE PERCENT: 11.7 %
NEUTROPHILS ABSOLUTE COUNT: 3.6 10*9/L (ref 1.8–7.8)
NEUTROPHILS RELATIVE PERCENT: 68.3 %
PLATELET COUNT: 161 10*9/L (ref 150–450)
RED BLOOD CELL COUNT: 4.37 10*12/L (ref 4.26–5.60)
RED CELL DISTRIBUTION WIDTH: 15.6 % — ABNORMAL HIGH (ref 12.2–15.2)
WBC ADJUSTED: 5.2 10*9/L (ref 3.6–11.2)

## 2021-07-31 LAB — BASIC METABOLIC PANEL
ANION GAP: 7 mmol/L (ref 5–14)
BLOOD UREA NITROGEN: 15 mg/dL (ref 9–23)
BUN / CREAT RATIO: 16
CALCIUM: 8.6 mg/dL — ABNORMAL LOW (ref 8.7–10.4)
CHLORIDE: 106 mmol/L (ref 98–107)
CO2: 26 mmol/L (ref 20.0–31.0)
CREATININE: 0.95 mg/dL
EGFR CKD-EPI (2021) MALE: >90 mL/min/{1.73_m2} (ref >=60)
GLUCOSE RANDOM: 217 mg/dL — ABNORMAL HIGH (ref 70–99)
POTASSIUM: 3.7 mmol/L (ref 3.4–4.8)
SODIUM: 139 mmol/L (ref 135–145)

## 2021-07-31 LAB — HEPATIC FUNCTION PANEL
ALBUMIN: 3.1 g/dL — ABNORMAL LOW (ref 3.4–5.0)
ALKALINE PHOSPHATASE: 88 U/L (ref 46–116)
ALT (SGPT): 54 U/L — ABNORMAL HIGH (ref 10–49)
AST (SGOT): 37 U/L — ABNORMAL HIGH (ref <=34)
BILIRUBIN DIRECT: 0.2 mg/dL (ref 0.00–0.30)
BILIRUBIN TOTAL: 0.2 mg/dL — ABNORMAL LOW (ref 0.3–1.2)
PROTEIN TOTAL: 5.9 g/dL (ref 5.7–8.2)

## 2021-07-31 LAB — MAGNESIUM: MAGNESIUM: 1.7 mg/dL (ref 1.6–2.6)

## 2021-07-31 LAB — PHOSPHORUS: PHOSPHORUS: 3.6 mg/dL (ref 2.4–5.1)

## 2021-07-31 MED ADMIN — sodium chloride 7% nebulizer solution 4 mL: 4 mL | RESPIRATORY_TRACT | @ 02:00:00

## 2021-07-31 MED ADMIN — doxycycline (VIBRA-TABS) tablet/capsule 100 mg: 100 mg | ORAL | @ 14:00:00 | Stop: 2021-08-06

## 2021-07-31 MED ADMIN — pancrelipase (Lip-Prot-Amyl) (CREON) 24,000-76,000 -120,000 unit delayed release capsule 288,000 units of lipase: 12 | ORAL | @ 23:00:00

## 2021-07-31 MED ADMIN — dornase alfa (PULMOZYME) 1 mg/mL solution 2.5 mg: 2.5 mg | RESPIRATORY_TRACT | @ 14:00:00

## 2021-07-31 MED ADMIN — montelukast (SINGULAIR) tablet 10 mg: 10 mg | ORAL | @ 03:00:00

## 2021-07-31 MED ADMIN — cefTAZidime (FORTAZ) 2 g in sodium chloride 0.9 % (NS) 100 mL IVPB-MBP: 2 g | INTRAVENOUS | @ 19:00:00 | Stop: 2021-08-13

## 2021-07-31 MED ADMIN — sodium chloride 7% nebulizer solution 4 mL: 4 mL | RESPIRATORY_TRACT | @ 14:00:00

## 2021-07-31 MED ADMIN — traZODone (DESYREL) tablet 100 mg: 100 mg | ORAL | @ 03:00:00

## 2021-07-31 MED ADMIN — gabapentin (NEURONTIN) capsule 600 mg: 600 mg | ORAL | @ 19:00:00

## 2021-07-31 MED ADMIN — oxyCODONE (ROXICODONE) immediate release tablet 5 mg: 5 mg | ORAL | @ 01:00:00 | Stop: 2021-08-04

## 2021-07-31 MED ADMIN — rivaroxaban (XARELTO) tablet 20 mg: 20 mg | ORAL | @ 14:00:00

## 2021-07-31 MED ADMIN — insulin lispro (HumaLOG) injection 0-20 Units: 0-20 [IU] | SUBCUTANEOUS | @ 23:00:00

## 2021-07-31 MED ADMIN — phytonadione (vitamin K1) (MEPHYTON) tablet 5 mg: 5 mg | ORAL | @ 14:00:00 | Stop: 2021-08-05

## 2021-07-31 MED ADMIN — albuterol 2.5 mg /3 mL (0.083 %) nebulizer solution 2.5 mg: 2.5 mg | RESPIRATORY_TRACT | @ 02:00:00

## 2021-07-31 MED ADMIN — tobramycin (PF) (TOBI) 300 mg/5 mL nebulizer solution 300 mg: 300 mg | RESPIRATORY_TRACT | @ 14:00:00 | Stop: 2021-08-27

## 2021-07-31 MED ADMIN — insulin lispro (HumaLOG) injection 32 Units: 32 [IU] | SUBCUTANEOUS | @ 19:00:00 | Stop: 2021-07-31

## 2021-07-31 MED ADMIN — cefTAZidime (FORTAZ) 2 g in sodium chloride 0.9 % (NS) 100 mL IVPB-MBP: 2 g | INTRAVENOUS | @ 11:00:00 | Stop: 2021-08-13

## 2021-07-31 MED ADMIN — tobramycin (PF) (TOBI) 300 mg/5 mL nebulizer solution 300 mg: 300 mg | RESPIRATORY_TRACT | @ 02:00:00 | Stop: 2021-08-27

## 2021-07-31 MED ADMIN — elexacaftor-tezacaftor-ivacaft (TRIKAFTA) tablet 1 tablet: 1 | ORAL | @ 23:00:00

## 2021-07-31 MED ADMIN — albuterol 2.5 mg /3 mL (0.083 %) nebulizer solution 2.5 mg: 2.5 mg | RESPIRATORY_TRACT | @ 14:00:00

## 2021-07-31 MED ADMIN — polyethylene glycol (MIRALAX) packet 17 g: 17 g | ORAL | @ 14:00:00 | Stop: 2021-07-31

## 2021-07-31 MED ADMIN — doxycycline (VIBRA-TABS) tablet/capsule 100 mg: 100 mg | ORAL | @ 03:00:00 | Stop: 2021-08-06

## 2021-07-31 MED ADMIN — oxyCODONE (ROXICODONE) immediate release tablet 5 mg: 5 mg | ORAL | @ 21:00:00 | Stop: 2021-08-04

## 2021-07-31 MED ADMIN — pramipexole (MIRAPEX) tablet 0.25 mg: .25 mg | ORAL | @ 23:00:00

## 2021-07-31 MED ADMIN — cefTAZidime (FORTAZ) 2 g in sodium chloride 0.9 % (NS) 100 mL IVPB-MBP: 2 g | INTRAVENOUS | @ 03:00:00 | Stop: 2021-08-13

## 2021-07-31 MED ADMIN — melatonin tablet 10.5 mg: 10 mg | ORAL | @ 03:00:00

## 2021-07-31 MED ADMIN — atorvastatin (LIPITOR) tablet 10 mg: 10 mg | ORAL | @ 03:00:00

## 2021-07-31 MED ADMIN — gabapentin (NEURONTIN) capsule 600 mg: 600 mg | ORAL | @ 14:00:00

## 2021-07-31 MED ADMIN — oxyCODONE (ROXICODONE) immediate release tablet 5 mg: 5 mg | ORAL | @ 15:00:00 | Stop: 2021-08-04

## 2021-07-31 MED ADMIN — cetirizine (ZyrTEC) tablet 10 mg: 10 mg | ORAL | @ 14:00:00

## 2021-07-31 MED ADMIN — insulin lispro (HumaLOG) injection 32 Units: 32 [IU] | SUBCUTANEOUS | @ 15:00:00 | Stop: 2021-07-31

## 2021-07-31 MED ADMIN — senna (SENOKOT) tablet 1 tablet: 1 | ORAL | @ 03:00:00

## 2021-07-31 MED ADMIN — insulin lispro (HumaLOG) injection 33 Units: 33 [IU] | SUBCUTANEOUS

## 2021-07-31 MED ADMIN — lamoTRIgine (LaMICtal) tablet 400 mg: 400 mg | ORAL | @ 14:00:00

## 2021-07-31 MED ADMIN — sodium chloride 7% nebulizer solution 4 mL: 4 mL | RESPIRATORY_TRACT | @ 22:00:00

## 2021-07-31 MED ADMIN — fluticasone furoate-vilanteroL (BREO ELLIPTA) 200-25 mcg/dose inhaler 1 puff: 1 | RESPIRATORY_TRACT | @ 14:00:00

## 2021-07-31 MED ADMIN — pancrelipase (Lip-Prot-Amyl) (CREON) 24,000-76,000 -120,000 unit delayed release capsule 288,000 units of lipase: 12 | ORAL | @ 15:00:00

## 2021-07-31 MED ADMIN — pancrelipase (Lip-Prot-Amyl) (CREON) 24,000-76,000 -120,000 unit delayed release capsule 288,000 units of lipase: 12 | ORAL | @ 19:00:00

## 2021-07-31 MED ADMIN — fluticasone propionate (FLONASE) 50 mcg/actuation nasal spray 2 spray: 2 | NASAL | @ 14:00:00

## 2021-07-31 MED ADMIN — gabapentin (NEURONTIN) capsule 600 mg: 600 mg | ORAL | @ 03:00:00

## 2021-07-31 MED ADMIN — FLUoxetine (PROzac) tablet/capsule 60 mg: 60 mg | ORAL | @ 14:00:00

## 2021-07-31 MED ADMIN — insulin glargine (LANTUS) injection 44 Units: 44 [IU] | SUBCUTANEOUS | @ 03:00:00

## 2021-07-31 MED ADMIN — pediatric multivitamin-vit D3 1,500 unit-vit K 800 mcg (MVW COMPLETE FORMULATION) capsule: 2 | ORAL | @ 03:00:00

## 2021-07-31 MED ADMIN — elexacaftor-tezacaftor-ivacaft (TRIKAFTA) tablet 2 tablet: 2 | ORAL | @ 14:00:00

## 2021-07-31 MED ADMIN — pediatric multivitamin-vit D3 1,500 unit-vit K 800 mcg (MVW COMPLETE FORMULATION) capsule: 2 | ORAL | @ 14:00:00

## 2021-07-31 MED ADMIN — sodium chloride 7% nebulizer solution 4 mL: 4 mL | RESPIRATORY_TRACT | @ 18:00:00

## 2021-07-31 MED ADMIN — pantoprazole (PROTONIX) EC tablet 20 mg: 20 mg | ORAL | @ 14:00:00

## 2021-07-31 MED ADMIN — amitriptyline (ELAVIL) tablet 100 mg: 100 mg | ORAL | @ 03:00:00

## 2021-07-31 NOTE — Unmapped (Addendum)
Pt VSS, afebrile, A/Ox4. Pt received two PRN doses oxy for pain, RT came and worked with patient for breathing treatments, pt self care with chest physiotherapy. Pt free of falls or injuries with all safety measures maintained.   Problem: Adult Inpatient Plan of Care  Goal: Plan of Care Review  Outcome: Ongoing - Unchanged  Goal: Patient-Specific Goal (Individualized)  Outcome: Ongoing - Unchanged  Goal: Absence of Hospital-Acquired Illness or Injury  Outcome: Ongoing - Unchanged  Intervention: Identify and Manage Fall Risk  Recent Flowsheet Documentation  Taken 07/31/2021 0725 by Jimmy Picket, RN  Safety Interventions:  ??? low bed  ??? lighting adjusted for tasks/safety  ??? bed alarm  ??? fall reduction program maintained  ??? family at bedside  ??? infection management  ??? isolation precautions  ??? room near unit station  ??? nonskid shoes/slippers when out of bed  Intervention: Prevent Skin Injury  Recent Flowsheet Documentation  Taken 07/31/2021 1030 by Jimmy Picket, RN  Skin Protection:  ??? adhesive use limited  ??? cleansing with dimethicone incontinence wipes  Taken 07/31/2021 0725 by Jimmy Picket, RN  Skin Protection:  ??? adhesive use limited  ??? cleansing with dimethicone incontinence wipes  Intervention: Prevent and Manage VTE (Venous Thromboembolism) Risk  Recent Flowsheet Documentation  Taken 07/31/2021 1030 by Jimmy Picket, RN  VTE Prevention/Management:  ??? ambulation promoted  ??? anticoagulant therapy  Taken 07/31/2021 0725 by Jimmy Picket, RN  Activity Management: activity adjusted per tolerance  Intervention: Prevent Infection  Recent Flowsheet Documentation  Taken 07/31/2021 0725 by Jimmy Picket, RN  Infection Prevention:  ??? hand hygiene promoted  ??? personal protective equipment utilized  ??? rest/sleep promoted  ??? single patient room provided  Goal: Optimal Comfort and Wellbeing  Outcome: Ongoing - Unchanged  Goal: Readiness for Transition of Care  Outcome: Ongoing - Unchanged  Goal: Rounds/Family Conference  Outcome: Ongoing - Unchanged     Problem: Infection  Goal: Absence of Infection Signs and Symptoms  Outcome: Ongoing - Unchanged  Intervention: Prevent or Manage Infection  Recent Flowsheet Documentation  Taken 07/31/2021 0725 by Jimmy Picket, RN  Infection Management: aseptic technique maintained  Isolation Precautions: contact precautions maintained     Problem: Diabetes Comorbidity  Goal: Blood Glucose Level Within Targeted Range  Outcome: Ongoing - Unchanged

## 2021-07-31 NOTE — Unmapped (Signed)
Problem: Adult Inpatient Plan of Care  Goal: Plan of Care Review  Outcome: Progressing  Flowsheets (Taken 07/30/2021 2030)  Progress: no change  Plan of Care Reviewed With: patient  Note:   Pt with cystic fibrosis exacerbation. BPs now WNL. Oxygen sats WNL on RA. Afebrile. IV abx as ordered. RT managing nebulizer treatments. No requests for pain meds overnight thusfar. WCTM.    Vitals:    07/30/21 1737 07/30/21 1800 07/30/21 2011 07/31/21 0430   BP: 122/76  129/72 121/66   Pulse: 93  96 97   Resp: 18  20 18    Temp: 36 ??C (96.8 ??F)  36.9 ??C (98.4 ??F) 36.8 ??C (98.3 ??F)   TempSrc: Oral  Oral Oral   SpO2: 98%  94% 96%   Weight:  (!) 111.1 kg (245 lb)     Height:         I/O this shift:  In: 1870 [P.O.:600; IV Piggyback:1270]  Out: -     Scheduled Meds:  ??? albuterol  2.5 mg Nebulization BID (RT)   ??? amitriptyline  100 mg Oral Nightly   ??? atorvastatin  10 mg Oral Nightly   ??? cefTAZidime  2 g Intravenous [facility]   ??? doxycycline  100 mg Oral Q12H SCH   ??? elexacaftor-tezacaftor-ivacaft  1 tablet Oral Q PM   ??? gabapentin  600 mg Oral TID   ??? insulin glargine  44 Units Subcutaneous Nightly   ??? insulin lispro  0-20 Units Subcutaneous ACHS   ??? melatonin  10.5 mg Oral Nightly   ??? montelukast  10 mg Oral Nightly   ??? MVW Complete (pediatric multivit 61-D3-vit K)  2 capsule Oral BID   ??? pramipexole  0.25 mg Oral Q PM   ??? senna  1 tablet Oral Nightly   ??? sodium chloride 7%  4 mL Nebulization QID (RT)   ??? tobramycin (PF)  300 mg Nebulization BID (RT)   ??? traZODone  100 mg Oral Nightly     Continuous Infusions: none  PRN Meds: none given  Goal: Patient-Specific Goal (Individualized)  Outcome: Progressing  Flowsheets (Taken 07/30/2021 2030)  Patient-Specific Goals (Include Timeframe): pt will return to RA, pt will wear O2 overnight for suspected OSA, will report adequately managed pain, pt will maintain blood pressures with adequate MAPs while sleeping  Individualized Care Needs: IV abx, contact precautions, ACHS, RT to manage neb treatments, monitor BPs and sedative medications  Anxieties, Fears or Concerns: none shared  Goal: Absence of Hospital-Acquired Illness or Injury  Outcome: Progressing  Intervention: Identify and Manage Fall Risk  Flowsheets (Taken 07/30/2021 2030)  Safety Interventions:  ??? lighting adjusted for tasks/safety  ??? low bed  ??? isolation precautions  ??? infection management  ??? nonskid shoes/slippers when out of bed  Intervention: Prevent Skin Injury  Flowsheets (Taken 07/30/2021 2030)  Body Position: position changed independently  Intervention: Prevent and Manage VTE (Venous Thromboembolism) Risk  Flowsheets (Taken 07/30/2021 2030)  Activity Management:  ??? activity encouraged  ??? activity adjusted per tolerance  ??? up ad lib  VTE Prevention/Management:  ??? ambulation promoted  ??? fluids promoted  ??? intravenous hydration  Intervention: Prevent Infection  Flowsheets (Taken 07/30/2021 2030)  Infection Prevention:  ??? equipment surfaces disinfected  ??? hand hygiene promoted  ??? rest/sleep promoted  ??? personal protective equipment utilized  ??? single patient room provided  ??? visitors restricted/screened  Goal: Optimal Comfort and Wellbeing  Outcome: Progressing  Intervention: Monitor Pain and Promote Comfort  Flowsheets (Taken  07/30/2021 2030)  Pain Management Interventions:  ??? pain management plan reviewed with patient/caregiver  ??? care clustered  ??? quiet environment facilitated  ??? relaxation techniques promoted  Intervention: Provide Person-Centered Care  Flowsheets (Taken 07/30/2021 2030)  Trust Relationship/Rapport:  ??? care explained  ??? choices provided  ??? emotional support provided  ??? empathic listening provided  ??? questions answered  ??? questions encouraged  ??? reassurance provided  ??? thoughts/feelings acknowledged     Problem: Infection  Goal: Absence of Infection Signs and Symptoms  Outcome: Progressing  Intervention: Prevent or Manage Infection  Flowsheets (Taken 07/30/2021 2030)  Infection Management: aseptic technique maintained  Isolation Precautions: contact precautions maintained     Problem: Diabetes Comorbidity  Goal: Blood Glucose Level Within Targeted Range  Outcome: Progressing  Intervention: Monitor and Manage Glycemia  Flowsheets (Taken 07/30/2021 2030)  Glycemic Management:  ??? blood glucose monitored  ??? supplemental insulin given

## 2021-07-31 NOTE — Unmapped (Signed)
Medicine Daily Progress Note    Assessment/Plan:  Active Problems:    Mood disorder (CMS-HCC)    Pancreatic insufficiency due to cystic fibrosis (CMS-HCC)    Bronchiectasis (CMS-HCC)    Chronic deep vein thrombosis (DVT) of lower extremity (CMS-HCC)    Cystic fibrosis with pulmonary exacerbation (CMS-HCC)    Cystic fibrosis exacerbation (CMS-HCC)    AKI (acute kidney injury) (CMS-HCC)    Sepsis (CMS-HCC)  Resolved Problems:    * No resolved hospital problems. *           Christian Young is a 35 y.o. male with CF,  with CF associated diabetes presenting with cough and fatigue, hospitalization complicated by hypotension and AKI.    Sepsis (resolved), hypotension- Episode of low BP due to possible sepsis vs hypotension from poor intake and multiple sedating medications. Now stable after IV fluids.   -F/u blood cultures, negative at 24 hours   -Further r/u as below   -CTN to hold lisinopril     CF exacerbation?-past cultures have grown MSSA and mucoid and smooth Pseudomonas aeruginosa (S to ceftaz, pip/tazo, tobramycin, as well as pansensitive stenotrophomonas.  Last hospitalized in November.  Last FEV1 11/03 68. Unclear if truly CF exacerbation since not producing significant sputum   -- continue home montelukast 10mg  daily, cetirizine 10mg  daily, albuterol nebs, hypertonic saline nebs (QID), Flonase BID, Bro Ellipta 200-6mcg daily (substitute for Symbicort, not on formulary), dornase alfa 2.5mg  daily, airway clearance with CV/aerobika   -Home Trikafta   -Appreciate pulmonology's recommendations  -CTN ceftazidime, doxycycline, and inhaled tobramycin per pulmonology's recommendations (D1 12/17)   -CF sputum culture if able to produce sputum   -Vitamin K repletion while on IV antibiotics   -Flow volume loop ordered   -As needed oxycodone/tylenol/lidocaine and then for chest wall pain    RUQ pain, Elevated AST/ALT - Mild LFT elevation. RUQUS with mild dilation of gallbladder but no other signs of cholecystitis. Negative Murphy's sign. Elevated LFts may be d/t hepatic steatosis.   -Monitor abdominal exam, consider HIDA scan if ongoing pain     AKI (improving) likely prerenal, history of AIN-Baseline creatinine around .6-.7.  History of AIN.  Creatinine increased from 0.63>1.79 in setting of hypotension, now improving to near baseline  -CTM   -Avoid nephrotoxic agents  -Continue supportive care    Chronic Medical Conditions:   Pancreatic insufficiency  - continue home Creon (8 capsules with meals, 4 capsules with snacks)  ??  CF associated diabetes- Last HbA1C 9   -Uses OmniPod at home  -Lantus 44 units nightly, aspart 33 units 3 times daily AC  -Consider endocrine consult if difficult to control sugars     Chronic Medical Conditions:  Mood disorder- continue home fluoxetine 60mg  daily, amitriptyline 100mg  daily, and lamotrigine 400mg  daily  History of DVT- continue home Xarelto 20mg  daily  GERD- continue home pantoprazole 20mg  daily  HTN- Hold lisinopril with hypotension and AKI   RLS- continue home mirapex 0.25mg  nightly, decreased trazodone to 100mg  considering low BP o  Chronic back pain- continue gabapentin 600mg  TID  ??  DVT ppx: Home agent  Floor status   Bowel regimen: Miralax and senna   ___________________________________________________________________    Subjective:  NAEON. Feeling better today. Less dizziness with walking. Not producing sputum overnight. Able to eat without significant N/V. No BM yet. Still mild Abd pain. Overall, feels well. Using Chest vest.     Labs/Studies:  Labs and Studies from the last 24hrs per EMR and Reviewed  Objective:  Temp:  [36 ??C (96.8 ??F)-36.9 ??C (98.4 ??F)] 36.2 ??C (97.1 ??F)  Heart Rate:  [89-97] 93  Resp:  [18-20] 18  BP: (121-134)/(66-77) 134/77  SpO2:  [94 %-98 %] 95 %    GEN: NAD, lying in bed, alert, oriented, no acute distress  EYES: EOMI, no scleral icterus or injection  ENT: MMM  CV: RRR, no murmurs heard, port site in left upper chest without erythema, no discharge or tenderness around site   PULM: CTAB, otherwise clear to auscultation, no wheezing, normal work of breathing on nasal cannula  ABD: soft, mild right upper quadrant tenderness to palpation, negative Murphy sign   EXT: No edema, warm extremities, peripheral pulses intact

## 2021-07-31 NOTE — Unmapped (Signed)
Sepsis Post Fluids Exam         I attest that I have completed a focused tissue perfusion assessment. I saw the patient at 8:30AM to assess after patient given fluids.     Additional Documentation Beyond This Point is Optional To Meet CMS Requirements    Actual Patient Weight: (!) 108.9 kg (240 lb)  Ideal body weight: 77.6 kg (171 lb 1.2 oz)  Body mass index is 32.55 kg/m??.    Physical Assessment   Cap refill <2 seconds, no cyanosis, mentating, radial pulses intact    Lactate downtrending

## 2021-07-31 NOTE — Unmapped (Addendum)
Pt afebrile, A/Ox4. Pt BP low at start of shift; pt very drowsy and intermittently falling asleep during conversation. Recheck BP WNL. Provider notified; ordered 1L LR bolus. BUN/Cr elevated; abx adjusted to better meet pt needs. Pt received 2 doses PRN pain medication with limited effect. RT came to bedside to do breathing treatments. Pt free of falls or injuries with all safety measures maintained.   Problem: Adult Inpatient Plan of Care  Goal: Plan of Care Review  Outcome: Ongoing - Unchanged  Goal: Patient-Specific Goal (Individualized)  Outcome: Ongoing - Unchanged  Goal: Absence of Hospital-Acquired Illness or Injury  Outcome: Ongoing - Unchanged  Intervention: Identify and Manage Fall Risk  Recent Flowsheet Documentation  Taken 07/30/2021 0820 by Jimmy Picket, RN  Safety Interventions:  ??? low bed  ??? lighting adjusted for tasks/safety  ??? bed alarm  ??? bleeding precautions  ??? family at bedside  ??? infection management  ??? nonskid shoes/slippers when out of bed  ??? room near unit station  Intervention: Prevent Skin Injury  Recent Flowsheet Documentation  Taken 07/30/2021 0820 by Jimmy Picket, RN  Skin Protection:  ??? adhesive use limited  ??? cleansing with dimethicone incontinence wipes  Intervention: Prevent and Manage VTE (Venous Thromboembolism) Risk  Recent Flowsheet Documentation  Taken 07/30/2021 0900 by Jimmy Picket, RN  VTE Prevention/Management: ambulation promoted  Taken 07/30/2021 0820 by Jimmy Picket, RN  Activity Management: activity adjusted per tolerance  Intervention: Prevent Infection  Recent Flowsheet Documentation  Taken 07/30/2021 0820 by Jimmy Picket, RN  Infection Prevention:  ??? hand hygiene promoted  ??? personal protective equipment utilized  ??? rest/sleep promoted  ??? single patient room provided  Goal: Optimal Comfort and Wellbeing  Outcome: Ongoing - Unchanged  Goal: Readiness for Transition of Care  Outcome: Ongoing - Unchanged  Goal: Rounds/Family Conference  Outcome: Ongoing - Unchanged     Problem: Infection  Goal: Absence of Infection Signs and Symptoms  Outcome: Ongoing - Unchanged  Intervention: Prevent or Manage Infection  Recent Flowsheet Documentation  Taken 07/30/2021 0820 by Jimmy Picket, RN  Infection Management: aseptic technique maintained  Isolation Precautions: contact precautions maintained     Problem: Diabetes Comorbidity  Goal: Blood Glucose Level Within Targeted Range  Outcome: Ongoing - Unchanged  Intervention: Monitor and Manage Glycemia  Recent Flowsheet Documentation  Taken 07/30/2021 0820 by Jimmy Picket, RN  Glycemic Management: blood glucose monitored

## 2021-07-31 NOTE — Unmapped (Incomplete)
Cystic Fibrosis Nutrition Assessment    Inpatient, Telehealth via phone: Nutrition Risk Screening this admission for CF and related follow up  Primary Pulmonary Provider: Dr [name]  ===================================================================  Christian Young is a 35 y.o. male w/ PMH significant for  ===================================================================  INTERVENTION:  ***  1. Recommend start PO supplement ***  2. Recommend start vitamin K phytonadione 5 mg *** twice weekly while on IV antibiotics.  3. Recommend CF vitamin regimen:  {NUTRCF Inpt vitamin:49437}  4. Weigh patient twice weekly this admit.  5. Nutrition Education provided regarding ***  6. Continue remainder of nutrition regimen:  - High Calorie Diet  - enzyme regimen  - acid reducer ***  - bowel regimen ***  - insulin regimen ***    Inpatient:   Will follow up with patient per protocol: {JBM Protocol:38691}  {[facility] Discharge Planning:77617}    Outpatient:  Time Spent (minutes): {nutrcfminutes:80200}  Follow-up will occur per CF nutrition risk protocol. Next follow-up occurs in {nutrcfmonths:80199}   To be assessed at time of follow-up: {MNT Monitoring at Follow-up:76793}  ===================================================================  ASSESSMENT:  Nutrition Category = {NUTR CF Category:73243}    Estimated daily needs: ***       Calories estimated using: {nutrcf calories v2:60208:x}, protein per DRI x 1.5-2, fluid per Holilday Segar    {nutrcf assessment v2:62751}    ASPEN/AND Malnutrition Screening:  {[facility] JXBJ:47829}  {[facility] malcom:76688}    Pediatric Malnutrition Assessment using AND/ASPEN Clinical Characteristics:                      Nutrition Focused Physical Exam:                         ***    Goals:  1. {MNT Goal Progress:77446}:  Meet estimated daily needs  2. {MNT Goal Progress:77446}:  Reach/maintain established anthropometric goals for {NUTR CF BMI goal:80628}  3. {MNT Goal Progress:77446}:  Normal fat-soluble vitamin levels: Vitamin A, Vitamin E and PT per lab range; Vitamin D 25OH total >30   4. {MNT Goal Progress:77446}:  Maintain glucose control. Carbohydrate content of diet should comprise 40-50% of total calorie needs, but carbohydrates are not restricted in this population.    5. {MNT Goal Progress:77446}:  Meet sodium needs for CF     Nutrition goals reviewed, and relevant barriers identified and addressed: {MNT Barriers to Care:77905}.   Patient is evaluated to have {Desc; good/fair/poor:18582} willingness and ability to achieve nutrition goals.   ===================================================================  INPATIENT:  Christian Young is admitted with Cystic fibrosis exacerbation (CMS-HCC) [E84.9].    Current Nutrition Orders (inpatient):  {[facility] FAO:13086}       Nutrition Orders   (From admission, onward)             Start     Ordered    07/30/21 1121  Nutrition Therapy High Calorie High Protein  Effective now        Question:  Nutrition Therapy:  Answer:  High Calorie High Protein    07/30/21 1120                CF Nutrition related medications (inpatient): Nutritionally relevant medications reviewed.  ***    CF Nutrition related labs (inpatient):  ***  {[facility] Labs option :76866}    ==================================================================  CLINICAL DATA:  Past Medical History:   Diagnosis Date   ??? Anxiety    ??? Chronic pain disorder    ??? Cystic fibrosis (CMS-HCC)    ???  Depression    ??? Hypertension    ??? Lumbar radiculopathy 10/26/2020   ??? Nonproductive cough 04/05/2018     Patient Active Problem List   Diagnosis   ??? Essential hypertension   ??? Depressive disorder   ??? Mood disorder (CMS-HCC)   ??? Anxiety   ??? 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 Chronic pansinusitis   ??? Pancreatic insufficiency due to cystic fibrosis (CMS-HCC)   ??? History of Mycobacterium abscessus infection   ??? Bronchiectasis (CMS-HCC)   ??? Chronic deep vein thrombosis (DVT) of lower extremity (CMS-HCC)   ??? Cystic fibrosis with pulmonary exacerbation (CMS-HCC)   ??? Cystic fibrosis exacerbation (CMS-HCC)   ??? Obesity (BMI 30-39.9)   ??? Restless leg syndrome   ??? Degeneration of lumbar intervertebral disc   ??? Lumbar radiculopathy   ??? Idiopathic peripheral neuropathy   ??? Long term current use of anticoagulant therapy   ??? Acute non-recurrent pansinusitis   ??? AKI (acute kidney injury) (CMS-HCC)   ??? Sepsis (CMS-HCC)       Anthroprometric Evaluation:  Weight changes: ***  CFTR modulator and weight change: {nutrcf modulator wt gain:73696}    BMI Readings from Last 3 Encounters:   07/30/21 33.23 kg/m??   06/08/21 33.91 kg/m??   03/14/21 33.20 kg/m??     Wt Readings from Last 3 Encounters:   07/30/21 (!) 111.1 kg (245 lb)   06/08/21 (!) 113.4 kg (250 lb)   03/14/21 (!) 111.2 kg (245 lb 1.6 oz)     Ht Readings from Last 3 Encounters:   07/29/21 182.9 cm (6')   06/08/21 182.9 cm (6')   03/14/21 183 cm (6' 0.05)     ==================================================================  Energy Intake (outpatient):  Diet: {nutrcf ZOXW:96045}     Intake evaluated this visit.  ***    Allergies, Intolerances, Sensitivities, and/or Cultural/Religious Dietary Restrictions:  {[facility] Allergies Intolerances etc :78406}  Allergies   Allergen Reactions   ??? Aztreonam Anaphylaxis, Hives, Nausea And Vomiting and Rash     fevers  Patient stated that he only vomited x1 with [place] in the past.????    fevers  Patient stated that he only vomited x1 with [place] in the past.????   ??? [place] [Aztreonam Lysine] Anaphylaxis   ??? Cefepime Itching, Nausea Only and Other (See Comments)     Headaches also   ??? Other Anaphylaxis and Other (See Comments)     Other reaction(s): Other (See Comments)  Bananas: itchy throat  Slo Bid record from [employer] states anaphylaxis.????Pt states this was from childhood and does not know reaction.  Bananas, causes itchy throat   ??? Slo-Bid 100 Anaphylaxis   ??? Tobramycin Tinnitus     From OSH record-documented as tinnitus but has received IV tobra with close monitoring.  Pt has received Tobramycin at [facility] since this allergy documented    From OSH record-documented as tinnitus but has received IV tobra with close monitoring.   ??? Banana Itching and Nausea And Vomiting           Sodium in diet: {NutrCF sodium:49716}  Calcium in diet:  {NutrCF calcium:49717}  Food Insecurity:  {Food Insecurity:55167}  Food Insecurity: No Food Insecurity   ??? Worried About Programme researcher, broadcasting/film/video in the Last Year: Never true   ??? Ran Out of Food in the Last Year: Never true    ***      CFTR modulator and Diet: {NUTRCF [place]:64628}  PO Supplements: ***  Patient resources for DME/formula:  {NUTRCF  JYNW2:95621}  Appetite Stimulant: {NutrCF app stim:69870}  Enteral feeding tube: ***  - Formula type:  {Nutrcf Formula:57549}  - Volume/day:  *** ml/day  - Pump type:  {Nutrcf pump type:57548}  - Pump rate:  *** ml/hr  - Use of Relizorb:  {NutrCF Relizorb2:71065}  - Tube placement date:  ***  Physical activity:  ***      GI/Malabsorption (outpatient):  Enzyme brand, (meals/snacks):  {NutrCF enzymes:50862} @ {NutrCF enzymesmeal:52798} and {NutrCF enzymessnack:49715}  Enzyme administration details: {Nutrcf Enzymedetails:49708}  Enzyme dose per MEAL (units lipase/kg/meal) ***  Enzyme dose per DAY (units lipase/kg/day) ***  GI meds:  Nutritionally relevant medications reviewed ***  Stools (steatorrhea): {NUTRCF STOOLS:39687}  Stools (constipation): {nutrcf stools constip:60198:a}  GI symptoms:  {NutrCF GI symptoms:62754}  Fecal Fat Studies: ***    No results found for: [id number]  Lab Results   Component Value Date    ELAST <15 (L) 03/19/2017     No results found for: PELAI    Vitamins/Minerals (outpatient):  CF-specific MVI, dose, compliance: {NutrCF Vitamin Brand:49709} {NutrCF vitamin form2:91375} {Nutrcf vitamin dose:49711}, {nutrcf vitamin compliance:50646}  Other vitamins/minerals/herbals: ***  Patient Resources for vitamins: {NUTRCF DMEv2:60061}  Calcium supplement: ***  Fat-soluble vitamin levels: ***  Lab Results   Component Value Date    VITAMINA 58.7 02/06/2021    VITAMINA 42.4 01/18/2020    VITAMINA 44.4 11/19/2016     Lab Results   Component Value Date    CRP 5.0 07/30/2021    CRP 10.0 06/15/2021    CRP 45.0 (H) 06/08/2021     Lab Results   Component Value Date    VITDTOTAL 38.2 02/06/2021    VITDTOTAL 27.9 03/19/2017     Lab Results   Component Value Date    VITAME 16.0 02/06/2021     Lab Results   Component Value Date    PT 23.3 (H) 07/30/2021    PT 14.3 (H) 06/12/2021    PT 14.7 (H) 06/08/2021     Lab Results   Component Value Date    DESGCARBPT 0.2 03/20/2017     No results found for: PIVKAII    Bone Health: {NUTRCF Bone health:92915}     CF Related Diabetes: ***  - Last OGTT diagnosis: {NUTRCF OGTT dx:73161}    Lab Results   Component Value Date    GLUF 178 (H) 01/18/2020     No results found for: GLUCOSE2HR  Lab Results   Component Value Date    A1C 9.0 (H) 06/08/2021    A1C 10.0 (H) 03/09/2021    A1C 10.7 (H) 11/29/2020       Todd Argabright MPH, RD, LDN  Pager: [phone]

## 2021-08-01 LAB — BASIC METABOLIC PANEL
ANION GAP: 7 mmol/L (ref 5–14)
BLOOD UREA NITROGEN: 13 mg/dL (ref 9–23)
BUN / CREAT RATIO: 15
CALCIUM: 9.3 mg/dL (ref 8.7–10.4)
CHLORIDE: 102 mmol/L (ref 98–107)
CO2: 29 mmol/L (ref 20.0–31.0)
CREATININE: 0.86 mg/dL
EGFR CKD-EPI (2021) MALE: >90 mL/min/{1.73_m2} (ref >=60)
GLUCOSE RANDOM: 259 mg/dL — ABNORMAL HIGH (ref 70–179)
POTASSIUM: 3.8 mmol/L (ref 3.4–4.8)
SODIUM: 138 mmol/L (ref 135–145)

## 2021-08-01 LAB — MAGNESIUM: MAGNESIUM: 1.6 mg/dL (ref 1.6–2.6)

## 2021-08-01 LAB — CBC W/ AUTO DIFF
BASOPHILS ABSOLUTE COUNT: 0 10*9/L (ref 0.0–0.1)
BASOPHILS RELATIVE PERCENT: 0.7 %
EOSINOPHILS ABSOLUTE COUNT: 0.2 10*9/L (ref 0.0–0.5)
EOSINOPHILS RELATIVE PERCENT: 3.6 %
HEMATOCRIT: 35.6 % — ABNORMAL LOW (ref 39.0–48.0)
HEMOGLOBIN: 11.7 g/dL — ABNORMAL LOW (ref 12.9–16.5)
LYMPHOCYTES ABSOLUTE COUNT: 1.5 10*9/L (ref 1.1–3.6)
LYMPHOCYTES RELATIVE PERCENT: 24.7 %
MEAN CORPUSCULAR HEMOGLOBIN CONC: 32.9 g/dL (ref 32.0–36.0)
MEAN CORPUSCULAR HEMOGLOBIN: 26.6 pg (ref 25.9–32.4)
MEAN CORPUSCULAR VOLUME: 81.1 fL (ref 77.6–95.7)
MEAN PLATELET VOLUME: 9 fL (ref 6.8–10.7)
MONOCYTES ABSOLUTE COUNT: 0.7 10*9/L (ref 0.3–0.8)
MONOCYTES RELATIVE PERCENT: 11.1 %
NEUTROPHILS ABSOLUTE COUNT: 3.6 10*9/L (ref 1.8–7.8)
NEUTROPHILS RELATIVE PERCENT: 59.9 %
PLATELET COUNT: 174 10*9/L (ref 150–450)
RED BLOOD CELL COUNT: 4.39 10*12/L (ref 4.26–5.60)
RED CELL DISTRIBUTION WIDTH: 15.6 % — ABNORMAL HIGH (ref 12.2–15.2)
WBC ADJUSTED: 6 10*9/L (ref 3.6–11.2)

## 2021-08-01 LAB — HEPATIC FUNCTION PANEL
ALBUMIN: 3 g/dL — ABNORMAL LOW (ref 3.4–5.0)
ALKALINE PHOSPHATASE: 92 U/L (ref 46–116)
ALT (SGPT): 47 U/L (ref 10–49)
AST (SGOT): 20 U/L (ref <=34)
BILIRUBIN DIRECT: 0.1 mg/dL (ref 0.00–0.30)
BILIRUBIN TOTAL: 0.2 mg/dL — ABNORMAL LOW (ref 0.3–1.2)
PROTEIN TOTAL: 6 g/dL (ref 5.7–8.2)

## 2021-08-01 LAB — PHOSPHORUS: PHOSPHORUS: 4.9 mg/dL (ref 2.4–5.1)

## 2021-08-01 MED ORDER — AMOXICILLIN 875 MG-POTASSIUM CLAVULANATE 125 MG TABLET
ORAL_TABLET | Freq: Two times a day (BID) | ORAL | 0 refills | 10 days
Start: 2021-08-01 — End: 2021-08-11

## 2021-08-01 MED ORDER — ACETAMINOPHEN 500 MG TABLET
ORAL_TABLET | Freq: Four times a day (QID) | ORAL | 0 refills | 8 days | PRN
Start: 2021-08-01 — End: ?

## 2021-08-01 MED ORDER — OXYCODONE 5 MG TABLET
ORAL_TABLET | Freq: Three times a day (TID) | ORAL | 0 refills | 2.00000 days | PRN
Start: 2021-08-01 — End: 2021-08-04

## 2021-08-01 MED ORDER — ONDANSETRON 4 MG DISINTEGRATING TABLET
ORAL_TABLET | Freq: Three times a day (TID) | ORAL | 0 refills | 3.00000 days | PRN
Start: 2021-08-01 — End: 2021-08-08

## 2021-08-01 MED ADMIN — insulin lispro (HumaLOG) injection 35 Units: 35 [IU] | SUBCUTANEOUS | @ 16:00:00

## 2021-08-01 MED ADMIN — polyethylene glycol (MIRALAX) packet 17 g: 17 g | ORAL | @ 03:00:00

## 2021-08-01 MED ADMIN — fluticasone propionate (FLONASE) 50 mcg/actuation nasal spray 2 spray: 2 | NASAL | @ 15:00:00

## 2021-08-01 MED ADMIN — doxycycline (VIBRA-TABS) tablet/capsule 100 mg: 100 mg | ORAL | @ 03:00:00 | Stop: 2021-08-06

## 2021-08-01 MED ADMIN — melatonin tablet 10.5 mg: 10 mg | ORAL | @ 03:00:00

## 2021-08-01 MED ADMIN — pediatric multivitamin-vit D3 1,500 unit-vit K 800 mcg (MVW COMPLETE FORMULATION) capsule: 2 | ORAL | @ 15:00:00

## 2021-08-01 MED ADMIN — dornase alfa (PULMOZYME) 1 mg/mL solution 2.5 mg: 2.5 mg | RESPIRATORY_TRACT | @ 13:00:00

## 2021-08-01 MED ADMIN — senna (SENOKOT) tablet 2 tablet: 2 | ORAL | @ 03:00:00

## 2021-08-01 MED ADMIN — sodium chloride 7% nebulizer solution 4 mL: 4 mL | RESPIRATORY_TRACT | @ 02:00:00

## 2021-08-01 MED ADMIN — cefTAZidime (FORTAZ) 2 g in sodium chloride 0.9 % (NS) 100 mL IVPB-MBP: 2 g | INTRAVENOUS | @ 11:00:00 | Stop: 2021-08-13

## 2021-08-01 MED ADMIN — amitriptyline (ELAVIL) tablet 100 mg: 100 mg | ORAL | @ 03:00:00

## 2021-08-01 MED ADMIN — montelukast (SINGULAIR) tablet 10 mg: 10 mg | ORAL | @ 03:00:00

## 2021-08-01 MED ADMIN — traZODone (DESYREL) tablet 100 mg: 100 mg | ORAL | @ 03:00:00

## 2021-08-01 MED ADMIN — fluticasone furoate-vilanteroL (BREO ELLIPTA) 200-25 mcg/dose inhaler 1 puff: 1 | RESPIRATORY_TRACT | @ 13:00:00

## 2021-08-01 MED ADMIN — Tc-99m Mebrofen (Choletec): 5.4 | INTRAVENOUS | @ 20:00:00 | Stop: 2021-08-01

## 2021-08-01 MED ADMIN — gabapentin (NEURONTIN) capsule 600 mg: 600 mg | ORAL | @ 03:00:00

## 2021-08-01 MED ADMIN — pancrelipase (Lip-Prot-Amyl) (CREON) 24,000-76,000 -120,000 unit delayed release capsule 288,000 units of lipase: 12 | ORAL | @ 16:00:00

## 2021-08-01 MED ADMIN — doxycycline (VIBRA-TABS) tablet/capsule 100 mg: 100 mg | ORAL | @ 15:00:00 | Stop: 2021-08-06

## 2021-08-01 MED ADMIN — rivaroxaban (XARELTO) tablet 20 mg: 20 mg | ORAL | @ 15:00:00

## 2021-08-01 MED ADMIN — pediatric multivitamin-vit D3 1,500 unit-vit K 800 mcg (MVW COMPLETE FORMULATION) capsule: 2 | ORAL | @ 03:00:00

## 2021-08-01 MED ADMIN — oxyCODONE (ROXICODONE) immediate release tablet 5 mg: 5 mg | ORAL | @ 16:00:00 | Stop: 2021-08-04

## 2021-08-01 MED ADMIN — pramipexole (MIRAPEX) tablet 0.25 mg: .25 mg | ORAL | @ 23:00:00

## 2021-08-01 MED ADMIN — oxyCODONE (ROXICODONE) immediate release tablet 5 mg: 5 mg | ORAL | @ 23:00:00 | Stop: 2021-08-04

## 2021-08-01 MED ADMIN — cefTAZidime (FORTAZ) 2 g in sodium chloride 0.9 % (NS) 100 mL IVPB-MBP: 2 g | INTRAVENOUS | @ 03:00:00 | Stop: 2021-08-13

## 2021-08-01 MED ADMIN — atorvastatin (LIPITOR) tablet 10 mg: 10 mg | ORAL | @ 03:00:00

## 2021-08-01 MED ADMIN — insulin lispro (HumaLOG) injection 35 Units: 35 [IU] | SUBCUTANEOUS | @ 23:00:00

## 2021-08-01 MED ADMIN — elexacaftor-tezacaftor-ivacaft (TRIKAFTA) tablet 2 tablet: 2 | ORAL | @ 15:00:00

## 2021-08-01 MED ADMIN — cefTAZidime (FORTAZ) 2 g in sodium chloride 0.9 % (NS) 100 mL IVPB-MBP: 2 g | INTRAVENOUS | @ 20:00:00 | Stop: 2021-08-13

## 2021-08-01 MED ADMIN — albuterol 2.5 mg /3 mL (0.083 %) nebulizer solution 2.5 mg: 2.5 mg | RESPIRATORY_TRACT | @ 02:00:00

## 2021-08-01 MED ADMIN — pancrelipase (Lip-Prot-Amyl) (CREON) 24,000-76,000 -120,000 unit delayed release capsule 288,000 units of lipase: 12 | ORAL | @ 23:00:00

## 2021-08-01 MED ADMIN — sodium chloride 7% nebulizer solution 4 mL: 4 mL | RESPIRATORY_TRACT | @ 18:00:00

## 2021-08-01 MED ADMIN — polyethylene glycol (MIRALAX) packet 17 g: 17 g | ORAL | @ 15:00:00

## 2021-08-01 MED ADMIN — cetirizine (ZyrTEC) tablet 10 mg: 10 mg | ORAL | @ 15:00:00

## 2021-08-01 MED ADMIN — phytonadione (vitamin K1) (MEPHYTON) tablet 5 mg: 5 mg | ORAL | @ 15:00:00 | Stop: 2021-08-05

## 2021-08-01 MED ADMIN — insulin lispro (HumaLOG) injection 0-20 Units: 0-20 [IU] | SUBCUTANEOUS | @ 20:00:00

## 2021-08-01 MED ADMIN — sodium chloride 7% nebulizer solution 4 mL: 4 mL | RESPIRATORY_TRACT | @ 13:00:00

## 2021-08-01 MED ADMIN — insulin glargine (LANTUS) injection 46 Units: 46 [IU] | SUBCUTANEOUS | @ 03:00:00

## 2021-08-01 MED ADMIN — FLUoxetine (PROzac) tablet/capsule 60 mg: 60 mg | ORAL | @ 15:00:00

## 2021-08-01 MED ADMIN — albuterol 2.5 mg /3 mL (0.083 %) nebulizer solution 2.5 mg: 2.5 mg | RESPIRATORY_TRACT | @ 13:00:00

## 2021-08-01 MED ADMIN — gabapentin (NEURONTIN) capsule 600 mg: 600 mg | ORAL | @ 15:00:00

## 2021-08-01 MED ADMIN — oxyCODONE (ROXICODONE) immediate release tablet 5 mg: 5 mg | ORAL | Stop: 2021-08-04

## 2021-08-01 MED ADMIN — insulin lispro (HumaLOG) injection 0-20 Units: 0-20 [IU] | SUBCUTANEOUS | @ 03:00:00

## 2021-08-01 MED ADMIN — elexacaftor-tezacaftor-ivacaft (TRIKAFTA) tablet 1 tablet: 1 | ORAL | @ 23:00:00

## 2021-08-01 MED ADMIN — lamoTRIgine (LaMICtal) tablet 400 mg: 400 mg | ORAL | @ 15:00:00

## 2021-08-01 MED ADMIN — pantoprazole (PROTONIX) EC tablet 20 mg: 20 mg | ORAL | @ 15:00:00

## 2021-08-01 MED ADMIN — insulin lispro (HumaLOG) injection 0-20 Units: 0-20 [IU] | SUBCUTANEOUS | @ 15:00:00

## 2021-08-01 MED ADMIN — tobramycin (PF) (TOBI) 300 mg/5 mL nebulizer solution 300 mg: 300 mg | RESPIRATORY_TRACT | @ 13:00:00 | Stop: 2021-08-27

## 2021-08-01 MED ADMIN — gabapentin (NEURONTIN) capsule 600 mg: 600 mg | ORAL | @ 20:00:00

## 2021-08-01 NOTE — Unmapped (Signed)
Pt. A/O, afebrile, ST, Denied pain during the shift, Breathing treatments done by RT, No acute event, bed locked and in low position, will continue to monitor.  Problem: Adult Inpatient Plan of Care  Goal: Plan of Care Review  Outcome: Ongoing - Unchanged  Goal: Patient-Specific Goal (Individualized)  Outcome: Ongoing - Unchanged  Goal: Absence of Hospital-Acquired Illness or Injury  Outcome: Ongoing - Unchanged  Intervention: Identify and Manage Fall Risk  Recent Flowsheet Documentation  Taken 08/01/2021 0400 by Clayton Bibles, RN  Safety Interventions:  ??? fall reduction program maintained  ??? low bed  Taken 08/01/2021 0200 by Clayton Bibles, RN  Safety Interventions:  ??? fall reduction program maintained  ??? low bed  Taken 08/01/2021 0000 by Clayton Bibles, RN  Safety Interventions:  ??? fall reduction program maintained  ??? low bed  Taken 07/31/2021 2200 by Clayton Bibles, RN  Safety Interventions:  ??? fall reduction program maintained  ??? low bed  Taken 07/31/2021 2000 by Clayton Bibles, RN  Safety Interventions:  ??? fall reduction program maintained  ??? low bed  Intervention: Prevent Skin Injury  Recent Flowsheet Documentation  Taken 07/31/2021 2000 by Clayton Bibles, RN  Skin Protection: adhesive use limited  Intervention: Prevent and Manage VTE (Venous Thromboembolism) Risk  Recent Flowsheet Documentation  Taken 07/31/2021 2000 by Clayton Bibles, RN  Activity Management: activity adjusted per tolerance  Intervention: Prevent Infection  Recent Flowsheet Documentation  Taken 08/01/2021 0400 by Clayton Bibles, RN  Infection Prevention:  ??? cohorting utilized  ??? hand hygiene promoted  Taken 08/01/2021 0200 by Clayton Bibles, RN  Infection Prevention:  ??? cohorting utilized  ??? hand hygiene promoted  Taken 08/01/2021 0000 by Clayton Bibles, RN  Infection Prevention:  ??? cohorting utilized  ??? hand hygiene promoted  Taken 07/31/2021 2200 by Clayton Bibles, RN  Infection Prevention:  ??? cohorting utilized  ??? hand hygiene promoted  Taken 07/31/2021 2000 by Clayton Bibles, RN  Infection Prevention:  ??? cohorting utilized  ??? hand hygiene promoted  Goal: Optimal Comfort and Wellbeing  Outcome: Ongoing - Unchanged  Goal: Readiness for Transition of Care  Outcome: Ongoing - Unchanged  Goal: Rounds/Family Conference  Outcome: Ongoing - Unchanged     Problem: Infection  Goal: Absence of Infection Signs and Symptoms  Outcome: Ongoing - Unchanged  Intervention: Prevent or Manage Infection  Recent Flowsheet Documentation  Taken 08/01/2021 0400 by Clayton Bibles, RN  Infection Management: aseptic technique maintained  Isolation Precautions: contact precautions maintained  Taken 08/01/2021 0200 by Clayton Bibles, RN  Infection Management: aseptic technique maintained  Isolation Precautions: contact precautions maintained  Taken 08/01/2021 0000 by Clayton Bibles, RN  Infection Management: aseptic technique maintained  Isolation Precautions: contact precautions maintained  Taken 07/31/2021 2200 by Clayton Bibles, RN  Infection Management: aseptic technique maintained  Isolation Precautions: contact precautions maintained  Taken 07/31/2021 2000 by Clayton Bibles, RN  Infection Management: aseptic technique maintained  Isolation Precautions: contact precautions maintained     Problem: Diabetes Comorbidity  Goal: Blood Glucose Level Within Targeted Range  Outcome: Ongoing - Unchanged

## 2021-08-01 NOTE — Unmapped (Signed)
PHYSICAL THERAPY  Evaluation (08/01/21 0906)          Patient Name:?? Christian Young????????   Medical Record Number: [id number]   Date of Birth: Feb 11, 1986  Sex: Male??  ??    Treatment Diagnosis: decreased gait speed     Activity Tolerance: Tolerated treatment well     ASSESSMENT         Assessment : Christian Young is a 35 y.o. male with CF,  with CF associated diabetes presenting with cough and fatigue, hospitalization complicated by hypotension and AKI. He presents to acute PT services with decreased gait speed but remains independent with no reports of lightheadedness. He has no further acute or post acute PT needs at this time. After a review of the personal factors, comorbidities, clinical presentation, and examination of the number of affected body systems, the patient presents as a low complexity case.      Today's Interventions: PT Eval, education re: PT role, POC, maintaining activity                  PLAN  Planned Frequency of Treatment:?? D/C Services for: D/C Services       Planned Interventions:       Post-Discharge Physical Therapy Recommendations:?? PT services not indicated     PT DME Recommendations: None??????????       Goals:   Patient and Family Goals: to maintain endurance        Prognosis:?? Excellent  Positive Indicators: age, CLOF  Barriers to Discharge: None     SUBJECTIVE  Patient reports: Pt agreeable to PT, feeling good  Current Functional Status: session began and ended with pt in bed     Prior Functional Status: Pt is IND, walks outside on their property  Equipment available at home: None      Past Medical History:   Diagnosis Date   ??? Anxiety    ??? Chronic pain disorder    ??? Cystic fibrosis (CMS-HCC)    ??? Depression    ??? Hypertension    ??? Lumbar radiculopathy 10/26/2020   ??? Nonproductive cough 04/05/2018            Social History     Tobacco Use   ??? Smoking status: Former     Types: e-Cigarettes   ??? Smokeless tobacco: Never   Substance Use Topics   ??? Alcohol use: Not Currently Alcohol/week: 3.0 standard drinks     Types: 3 Glasses of wine per week       Past Surgical History:   Procedure Laterality Date   ??? IR INSERT PORT AGE GREATER THAN 5 YRS  03/27/2019    IR INSERT PORT AGE GREATER THAN 5 YRS 03/27/2019 Rush Barer, MD IMG [facility]   ??? PR REMOVAL OF LUNG,LOBECTOMY Right 03/29/2017    Procedure: REMOVAL OF LUNG, OTHER THAN PNEUMONECTOMY; SINGLE LOBE (LOBECTOMY);  Surgeon: Cherie Dark, MD;  Location: MAIN OR [facility];  Service: Thoracic             Family History   Problem Relation Age of Onset   ??? Bipolar disorder Mother    ??? Depression Mother         Allergies: Aztreonam, [place] [aztreonam lysine], Cefepime, Other, Slo-bid 100, Tobramycin, and Banana                  Objective Findings  Precautions / Restrictions  Precautions: Isolation precautions (contact/CF)  Weight Bearing Status: Non-applicable  Required Braces or Orthoses: Non-applicable  Communication Preference: Verbal          Pain Comments: denied  Medical Tests / Procedures: chart reviewed  Equipment / Environment: Vascular access (PIV, TLC, Port-a-cath, PICC), Patient not wearing mask for full session     At Rest: VSS  With Activity: spO2 96% post ambulation on RA  Orthostatics: asymptomatic        Living Situation  Living Environment: House  Lives With: Spouse  Home Living: One level home, Level entry, Tub/shower unit, Standard height toilet      Cognition: WFL              Upper Extremities  UE Strength: Right WFL, Left WFL    Lower Extremities  LE Strength: Right WFL, Left WFL     Sensation: WFL  Balance: WFL      Bed Mobility: Supine to Sit  Supine to Sit assistance level: Independent     Transfers: Sit to Stand  Sit to Stand assistance level: Independent      Gait Level of Assistance: Independent  Gait Distance Ambulated (ft): 350 ft  Gait: steady, slow pace     Stairs: n/a            Endurance: good     Physical Therapy Session Duration  PT Individual [mins]: 12     Medical Staff Made Aware: RN     I attest that I have reviewed the above information.  Signed: Salome Holmes, PT  Filed 08/01/2021

## 2021-08-01 NOTE — Unmapped (Signed)
Pulmonary Consult Service  Treatment plan  ??  ??  Primary Service:                                Medicine - Hospitalist  Primary Service Attending:              Pixie Casino, MD  Reason for Consult:                          CF exacerbation  ??  ASSESSMENT and PLAN   ??  Christian Young is a 35 y.o. male with history of CF [phone] and [phone] insertion) on trikafta,??CFRD on insulin, history of DVT (on xarelto currently),??with recent baseline lung function of FEV1 ~72%??who presented with dizziness, stomach pain, fatigue & increased cough despite starting doxycycline outpatient. He was admitted for treatment of CF exacerbation; this is his 3rd admission in 6 mo for the same. However, he does not have increased sputum production or dyspnea. RPP negative. CRP is only 5. CXR is unchanged from previously - what is reported as small R effusion I suspect is actually a small area of persistently atelectatic lung based on prior imaging. His main concern as described to me today was vertigo - which could be 2/2 post-viral labyrinthitis. Serum cortisol levels are normal, no recent steroids. Normal IgE in June. He did have an episode of hypotension overnight which improved with fluids, iso ?polypharmacy and associated with a rapid rise in his WBC to 16.7 and Cr bump to 1.79, now improving.     New trial STOP2 discusses duration of treatment for CF exacerbations. It randomized people to 10, 14, or 21 days of IV antibiotic treatment based on improvement in CRISS symptom scores and PFT improvement measured at day 7-10 of treatment.  Those meeting pre-defined criteria for improvement were considered early responders and were randomized to 10 vs. 14 days of treatment, and the study showed that 10 days was not inferior to 14 days in these subjects. In non-early responders 21 days was not superior to 14 days of treatment.    Renaldo Harrison CH, [place], [place], [place], [address] [place], [address] [place], [place] B, [employer] BC, [address], [employer] PA; [employer]. A Randomized Clinical Trial of Antimicrobial Duration for Cystic Fibrosis Pulmonary Exacerbation Treatment. Ledora Bottcher Crit Care Med. 2021 Dec 1;204(11):1295-1305. doi: 10.1164/rccm.[id number]. PMID: [id number].)    1. CF bronchiectasis exacerbation: Increased cough, but minimal sputum production even with increased airway clearance. Initially on zosyn + tobramycin for Hx MSSA & mucoid PSA (and pan-sensitive Steno x1 in August 2022), but transitioned to regimen below given renal injury & hx AIN on zosyn previously.   Spirometry 12/20 with stable FEV1. Ok to discharge from pulmonary perspective. Would treat for 10 days total with Augmentin. Will contact primary pulmonologist to update on plan.   - antibiotics: ceftazidime + tobramycin nebs + doxycycline  - Please obtain CF sputum culture + AFB culture if able  - f/u BCx  - Aggressive airway clearance with: Albuterol followed by 7% HTS, with Vest + aerobika TID   - Continue pulmozyme daily, and formulary substitution for home symbicort  - Weekly labs: CRP, ESR  - If clinical status, PFTs & sputum culture are reassuring, would favor rapid transition to oral antibiotics & discharge home.   - Please engage PT   ??  2. Chronic pulmonary CF management  -  Continue Trikafta   - continue home Pulmozyme  ??  3. Chronic CF GI/pancreatic management  - recommend aggressive bowel regimen  - please restart home creon  - f/u nutrition recommendations  - Home CF vitamin  - For INR >1.4, please give vitamin K 5mg  PO daily x 5 days (recognizing that xarelto has some effect on his INR)  - Encourage walking, out of bed, activity  ??  4. CFRD - Last A1c 9 in Oct 2022  - Continue long acting 44 u qPM & short acting 32 u tid + SSI  - Low threshold to engage endocrine if sugars remain difficult to control  ??  5. Hx DVT  - Continue home xarelto   ??  Mr. Lacivita was discussed with  Dr. Judie Petit. Rolm Baptise   Thank you for involving [name] in his care.   Please don't hesitate to page [name] with any questions or concerns at [phone] (pulmonary consult fellow).  ??  Allyson Sabal, MD   Pulmonary and Critical Care Fellow   Pager: [phone]

## 2021-08-01 NOTE — Unmapped (Addendum)
[ ]   Repeat LFTs outpt     Christian Young is a 35 y.o. male with CF,  with CF associated diabetes presenting with cough and fatigue concerning for CF exacerbation, hospitalization complicated by hypotension and AKI.    Hypotension, possible sepsis-the patient had episode of hypotension on the night after admission.  Rapid was called for this.  This improved after given 3 L of IV fluids.  Lactate was elevated developed AKI at the time.  Blood cultures were obtained and negative.  Received dose of daptomycin since port was placed which was stopped after 24 hours as quickly improved with holding lisinopril, stopping IV morphine, giving more IV fluids.  No obvious source of sepsis aside from possible CF exacerbation identified.    CF exacerbation-past cultures have grown MSSA and mucoid and smooth Pseudomonas aeruginosa (S to ceftaz, pip/tazo, tobramycin, as well as pansensitive stenotrophomonas.  Last hospitalized in November.  Last FEV1 11/03 68. Did not have significant sputum production during hospitalization. CXR with Mild bibasilar atelectasi and small R sided pleural effusion seen in past. Treated initially with Pipercillin/tazobactam and IV tobramycin which was transitioned with AKI to ceftazidime and doxycycline and tobramycin nebulizer treatments.  CRP level was  5. Also completed airway clearance hypertonic nebs, chest vest, with Pulmozyme.  Continued home Trikafta.  Pulmonary function tests obtained showing FEV1 69% (stable from prior).  Discussed with pulmonology who saw during admission and ultimately plan to discharge with 10 day of Augmentin given clinical stability.  Was also given p.o. vitamin K while on IV antibiotics. Sent home with short course of PRN oxycodone for chest wall pain.     Right upper quadrant pain, LFT abnormality.-Patient had mild right upper quadrant pain with negative Murphy sign.  Right upper quadrant ultrasound with mild dilation of the gallbladder which could be related to n.p.o. status but normal, bile duct, and no other signs of cholecystitis.  A patient continues have mild right upper quadrant pain and no progressive symptoms or nausea or vomiting.  Had mild LFT abnormalities which improved throughout admission.  He was clinically monitor.  Due to persistent RUQ pain, RUQ obtained which was negative for acute cholecystitis.LFTs should be repeated outpatient.    AKI, past history of AIN-developed AKI with creatinine peaking at 1.79 after episode of hypotension.  History of AIN on Pip/Tazo.  Creatinine quickly down trended to baseline after IV fluids.  Pip/tazo and IV tobramycin stopped. Cr improved to baseline by discharge.     CF associated diabetes-last hemoglobin A1c 9.  Uses Omni pod at home.  Lantus and lispro increased during hospitalization due to hyperglycemia to 46u night and 35u TIDAC. He will resume Omnipod on discharge.    Chronic medical issues:  Mood disorder- continue home fluoxetine 60mg  daily, amitriptyline 100mg  daily, and lamotrigine 400mg  daily  History of DVT- continue home Xarelto 20mg  daily  GERD- continue home pantoprazole 20mg  daily  HTN- Held lisinopril with hypotension and AKI, resumed on discharge    RLS- continue home mirapex 0.25mg  nightly, decreased trazodone to 100mg  considering low BP   Chronic back pain- continue gabapentin 600mg  TID

## 2021-08-01 NOTE — Unmapped (Signed)
Patient BBS- dim//clear; strong non productive cough; sp02 96% on room air; airway clearance -home chest vest; no complications noted this shift. RT will continue to monitor.

## 2021-08-01 NOTE — Unmapped (Signed)
Pulmonary Consult Service  Consult Follow Up Note      Primary Service:   Medicine - Hospitalist  Primary Service Attending:  Pixie Casino, MD  Reason for Consult:   CF exacerbation    ASSESSMENT and PLAN     Christian Young is a 35 y.o. male with history of CF ([id number] and [phone] insertion) on trikafta, CFRD on insulin, history of DVT (on xarelto currently), with recent baseline lung function of FEV1 ~72% who presented with dizziness, stomach pain, fatigue & increased cough despite starting doxycycline outpatient. He was admitted for treatment of CF exacerbation; this is his 3rd admission in 6 mo for the same. However, he does not have increased sputum production or dyspnea. RPP negative. CRP is only 5. CXR is unchanged from previously - what is reported as small R effusion I suspect is actually a small area of persistently atelectatic lung based on prior imaging. His main concern as described to me today was vertigo - which could be 2/2 post-viral labyrinthitis. Serum cortisol levels are normal, no recent steroids. Normal IgE in June. He did have an episode of hypotension overnight which improved with fluids, iso ?polypharmacy and associated with a rapid rise in his WBC to 16.7 and Cr bump to 1.79, now improving.     1. CF bronchiectasis exacerbation: Increased cough, but minimal sputum production even with increased airway clearance. Initially on zosyn + tobramycin for Hx MSSA & mucoid PSA (and pan-sensitive Steno x1 in August 2022), but transitioned to regimen below given renal injury & hx AIN on zosyn previously.   - antibiotics: ceftazidime + tobramycin nebs + doxycycline  - Please obtain CF sputum culture + AFB culture if able  - f/u BCx  - Consider CT chest no improvement w/ abx above, last had dedicated chest imaging in 2019  - Please obtain flow volume loop 12/19, will call PFT lab to try to get done. Per patient had worsening home Spirometry  - Aggressive airway clearance with: Albuterol followed by 7% HTS, with Vest + aerobika TID   - Continue pulmozyme daily, and formulary substitution for home symbicort  - Weekly labs: CRP, ESR  - If clinical status, PFTs & sputum culture are reassuring, would favor rapid transition to oral antibiotics & discharge home.   - Please engage PT      2. Chronic pulmonary CF management  - Continue Trikafta   - continue home Pulmozyme     3. Chronic CF GI/pancreatic management  - recommend aggressive bowel regimen  - please restart home creon  - f/u nutrition recommendations  - Home CF vitamin  - For INR >1.4, please give vitamin K 5mg  PO daily x 5 days (recognizing that xarelto has some effect on his INR)  - Encourage walking, out of bed, activity    4. CFRD - Last A1c 9 in Oct 2022  - Continue long acting 44 u qPM & short acting 32 u tid + SSI  - Low threshold to engage endocrine if sugars remain difficult to control    5. Hx DVT  - Continue home xarelto     Christian Young was seen, examined and discussed with  Dr. Judie Petit. Rolm Baptise   Thank you for involving [name] in his care. We look forward to following with you.  Recommendations were communicated to the primary team at 9:50 AM  Please don't hesitate to page [name] with any questions or concerns at [phone] (pulmonary consult fellow).    Irving Burton  Deniece Portela, MD   Pulmonary and Critical Care Fellow   Pager: [phone]      HISTORY:     History of Present Illness:  Christian Young is a 36 y.o. male with with history of CF [phone] and [phone] insertion) on trikafta, CFRD on insulin, history of DVT (on xarelto currently), with recent baseline lung function of FEV1 ~72% who presented with dizziness, stomach pain, fatigue & increased cough despite starting doxycycline outpatient. He was admitted for treatment of CF exacerbation; this is his 3rd admission in 6 mo for the same. He gives a history of exposure to respiratory virus over thanksgiving. He began feeling poorly on Wednesday of last week with increased dizziness, fatigue, and worsened cough. He is not bringing up sputum despite TID airway clearance. States that he is using his trikafta. No hemoptysis. PCP started him on doxycycline after which he had abdominal pain (no change in stools) and nausea. He noted worsened vertigo on Friday with dizziness on standing like the room was moving around me. When symptoms did not improve, he presented to [facility] ED for admission. This differs slightly to the hx he provided to admitting physician last night.  Typically exacerbations present with worsening purulent sputum & cough.   Non smoker, EtOH likely >3-4 drinks/wk, no longer vaping.     07/31/21:  Patient feeling improved. Still with some chest tightness. RUQ pain is persistent. Per patient home spirometry was decreased. Not producing sputum.        PHYSICAL EXAM:   BP 142/83  - Pulse 110  - Temp 36.1 ??C (97 ??F) (Oral)  - Resp 18  - Ht 182.9 cm (6')  - Wt (!) 111.1 kg (245 lb)  - SpO2 95%  - BMI 33.23 kg/m??   General: A&O x3, not in distress  HEENT: Trachea midline, PERRL  Cardiac: Regular rate and rhythm, no murmurs  Lungs: Normal work of breathing, clear to auscultation bilaterally, no wheezing or rhonchi, no accessory muscle use  Abdomen: Soft, nontender, no distention  MSK: No lower extremity edema, moving all extremities  Neuro: Cranial nerves grossly intact, gait not assessed  Skin: No rashes, no erythema  Psychiatric: Calm, appropriate mood and affect        LABORATORY and RADIOLOGY DATA:     Pertinent Laboratory Data:  Lab Results   Component Value Date    WBC 5.2 07/31/2021    HGB 11.9 (L) 07/31/2021    HCT 35.6 (L) 07/31/2021    PLT 161 07/31/2021       Lab Results   Component Value Date    NA 139 07/31/2021    K 3.7 07/31/2021    CL 106 07/31/2021    CO2 26.0 07/31/2021    BUN 15 07/31/2021    CREATININE 0.95 07/31/2021    GLU 217 (H) 07/31/2021    CALCIUM 8.6 (L) 07/31/2021    MG 1.7 07/31/2021    PHOS 3.6 07/31/2021       Lab Results   Component Value Date    BILITOT 0.2 (L) 07/31/2021    BILIDIR 0.20 07/31/2021    PROT 5.9 07/31/2021    ALBUMIN 3.1 (L) 07/31/2021    ALT 54 (H) 07/31/2021    AST 37 (H) 07/31/2021    ALKPHOS 88 07/31/2021       Lab Results   Component Value Date    LABPROT 11.8 01/18/2020    INR 2.00 07/30/2021       Pertinent Micro Data:  Microbiology  Results (last day)       Procedure Component Value Date/Time Date/Time    MRSA Screen [[id number]] Collected: 07/30/21 0839    Lab Status: In process Specimen: Nares from Nose Updated: 07/30/21 0915    Blood Culture #2 [[id number]] Collected: 07/30/21 0704    Lab Status: In process Specimen: Blood from 1 Peripheral Draw Updated: 07/30/21 0723    Blood Culture #1 [[id number]] Collected: 07/30/21 0704    Lab Status: In process Specimen: Blood from 1 Peripheral Draw Updated: 07/30/21 0723    CF Sputum/ CF Sinus Culture [[id number]]     Lab Status: No result Specimen: Sputum, CF     Lower Respiratory Culture [[id number]]     Lab Status: No result Specimen: SPUTUM EXPECTORATED     Respiratory Pathogen Panel with COVID-19 [[id number]]  (Normal) Collected: 07/29/21 1505    Lab Status: Final result Specimen: Nasopharyngeal Swab Updated: 07/29/21 1627     Adenovirus Not Detected     Coronavirus HKU1 Not Detected     Coronavirus NL63 Not Detected     Coronavirus 229E Not Detected     Coronavirus OC43 PCR Not Detected     Metapneumovirus Not Detected     Rhinovirus/Enterovirus Not Detected     Influenza A Not Detected     Influenza B Not Detected     Parainfluenza 1 Not Detected     Parainfluenza 2 Not Detected     Parainfluenza 3 Not Detected     Parainfluenza 4 Not Detected     RSV Not Detected     Bordetella pertussis Not Detected     Comment: If B. pertussis/parapertussis infection is suspected, the Bordetella pertussis/parapertussis Qualitative PCR test should be ordered.        Bordetella parapertussis Not Detected     Chlamydophila (Chlamydia) pneumoniae Not Detected     Mycoplasma pneumoniae Not Detected     SARS-CoV-2 PCR Not Detected    Narrative:      This result was obtained using the FDA-cleared Bio[employer] Respiratory 2.1 Panel. Performance characteristics have been established and verified by the Clinical Molecular Microbiology Laboratory, [facility]. This assay does not distinguish between rhinovirus and enterovirus. Lower respiratory specimens will not be tested for Bordetella pertussis/parapertussis. For nasopharyngeal swabs, cross-reactivity may occur between B. pertussis and non-pertussis Bordetella species. All positive B. pertussis results will be automatically confirmed using our in-house PCR assay. Ct (cycle threshold) values for SARS-CoV-2 are not available for this test.    Lower Respiratory Culture [[id number]]     Lab Status: No result Specimen: Sputum from Lung, Combined              Pertinent Imaging Data:  All pertinent imaging studies have been reviewed as part of this encounter.

## 2021-08-01 NOTE — Unmapped (Signed)
Medicine Daily Progress Note    Assessment/Plan:  Active Problems:    Mood disorder (CMS-HCC)    Pancreatic insufficiency due to cystic fibrosis (CMS-HCC)    Bronchiectasis (CMS-HCC)    Chronic deep vein thrombosis (DVT) of lower extremity (CMS-HCC)    Cystic fibrosis with pulmonary exacerbation (CMS-HCC)    Cystic fibrosis exacerbation (CMS-HCC)    AKI (acute kidney injury) (CMS-HCC)    Sepsis (CMS-HCC)  Resolved Problems:    * No resolved hospital problems. *           Christian Young is a 35 y.o. male with CF,  with CF associated diabetes presenting with cough and fatigue, hospitalization complicated by hypotension and AKI.    Sepsis (resolved), hypotension- Episode of low BP due to possible sepsis vs hypotension from poor intake and multiple sedating medications. Now stable after IV fluids. Blood cultures negative at 24 hours   -Further r/u as below   -CTN to hold lisinopril     CF exacerbation?-past cultures have grown MSSA and mucoid and smooth Pseudomonas aeruginosa (S to ceftaz, pip/tazo, tobramycin, as well as pansensitive stenotrophomonas.  Last hospitalized in November.  Last FEV1 11/03 68. Unclear if truly CF exacerbation since not producing significant sputum   -- continue home montelukast 10mg  daily, cetirizine 10mg  daily, albuterol nebs, hypertonic saline nebs (QID), Flonase BID, Bro Ellipta 200-57mcg daily (substitute for Symbicort, not on formulary), dornase alfa 2.5mg  daily, airway clearance with CV/aerobika   -Home Trikafta   -Appreciate pulmonology's recommendations  -CTN ceftazidime, doxycycline, and inhaled tobramycin per pulmonology's recommendations (D1 12/17)   -CF sputum culture if able to produce sputum   -Vitamin K repletion while on IV antibiotics x5 days (12/18-22)   -Flow volume loop ordered, obtaining on 12/20 to better assess dispo   -As needed oxycodone/tylenol/lidocaine and then for chest wall pain    RUQ pain, Elevated AST/ALT - Mild LFT elevation. RUQUS with mild dilation of gallbladder but no other signs of cholecystitis.  Elevated LFts may be d/t hepatic steatosis.  However, continuing to have fairly significant right upper quadrant tenderness to palpation on exam.  -Obtain HIDA scan to clarify if cholecystitis present    AKI (resolved) likely prerenal, history of AIN-Baseline creatinine around .6-.7.  History of AIN.  Creatinine increased from 0.63>1.79 in setting of hypotension, now improving to near baseline  -CTM, Avoid nephrotoxic agents    CF associated diabetes- Last HbA1C 9   -Uses OmniPod at home  -Lantus 46 units nightly, aspart 35 units 3 times daily AC  -Consider endocrine consult if difficult to control sugars     Chronic Medical Conditions:   Pancreatic insufficiency- continue home Creon (8 capsules with meals, 4 capsules with snacks)  Mood disorder- continue home fluoxetine 60mg  daily, amitriptyline 100mg  daily, and lamotrigine 400mg  daily  History of DVT- continue home Xarelto 20mg  daily  GERD- continue home pantoprazole 20mg  daily  HTN- Hold lisinopril with hypotension and AKI, consider resuming 12/21 if remains stable  RLS- continue home mirapex 0.25mg  nightly, decreased trazodone to 100mg  considering low BP  Chronic back pain- continue gabapentin 600mg  TID  ??  DVT ppx: Home agent  Floor status   Bowel regimen: Miralax and senna   Dispo: Pending results of spirometry and pulm input   ___________________________________________________________________    Subjective:  NAEON. Received 15mg  of oxycodone yesterday.  Overall, feeling better.  Still not producing sputum .  Had a bowel movement.  Is still having very significant right upper quadrant tenderness to  palpation.  Did not feel like that is muscle pain from coughing.  No dizziness when walking.  Chest wall pain is improving.    Labs/Studies:  Labs and Studies from the last 24hrs per EMR and Reviewed      Objective:  Temp:  [35.9 ??C (96.7 ??F)-36.4 ??C (97.5 ??F)] 35.9 ??C (96.7 ??F)  Heart Rate:  [87-115] 87  Resp: [18-20] 18  BP: (129-142)/(77-86) 129/86  SpO2:  [93 %-98 %] 93 %    GEN: NAD, lying in bed, alert, oriented, no acute distress  EYES: EOMI, no scleral icterus or injection  ENT: MMM  CV: RRR, no murmurs heard, port site in left upper chest without erythema, no discharge or tenderness around port  site   PULM: CTAB, otherwise clear to auscultation, no wheezing, normal work of breathing on nasal cannula  ABD: soft, still significant right upper quadrant tenderness to palpation, seems increased from prior with some mid inspirations pause   EXT: No edema, warm extremities, peripheral pulses intact

## 2021-08-02 LAB — CBC W/ AUTO DIFF
BASOPHILS ABSOLUTE COUNT: 0 10*9/L (ref 0.0–0.1)
BASOPHILS RELATIVE PERCENT: 0.7 %
EOSINOPHILS ABSOLUTE COUNT: 0.1 10*9/L (ref 0.0–0.5)
EOSINOPHILS RELATIVE PERCENT: 2.3 %
HEMATOCRIT: 34.9 % — ABNORMAL LOW (ref 39.0–48.0)
HEMOGLOBIN: 11.5 g/dL — ABNORMAL LOW (ref 12.9–16.5)
LYMPHOCYTES ABSOLUTE COUNT: 1.7 10*9/L (ref 1.1–3.6)
LYMPHOCYTES RELATIVE PERCENT: 28 %
MEAN CORPUSCULAR HEMOGLOBIN CONC: 33.1 g/dL (ref 32.0–36.0)
MEAN CORPUSCULAR HEMOGLOBIN: 26.8 pg (ref 25.9–32.4)
MEAN CORPUSCULAR VOLUME: 81 fL (ref 77.6–95.7)
MEAN PLATELET VOLUME: 9 fL (ref 6.8–10.7)
MONOCYTES ABSOLUTE COUNT: 0.6 10*9/L (ref 0.3–0.8)
MONOCYTES RELATIVE PERCENT: 9.8 %
NEUTROPHILS ABSOLUTE COUNT: 3.6 10*9/L (ref 1.8–7.8)
NEUTROPHILS RELATIVE PERCENT: 59.2 %
PLATELET COUNT: 164 10*9/L (ref 150–450)
RED BLOOD CELL COUNT: 4.31 10*12/L (ref 4.26–5.60)
RED CELL DISTRIBUTION WIDTH: 16.1 % — ABNORMAL HIGH (ref 12.2–15.2)
WBC ADJUSTED: 6 10*9/L (ref 3.6–11.2)

## 2021-08-02 LAB — MAGNESIUM: MAGNESIUM: 1.5 mg/dL — ABNORMAL LOW (ref 1.6–2.6)

## 2021-08-02 LAB — BASIC METABOLIC PANEL
ANION GAP: 8 mmol/L (ref 5–14)
BLOOD UREA NITROGEN: 12 mg/dL (ref 9–23)
BUN / CREAT RATIO: 16
CALCIUM: 8.7 mg/dL (ref 8.7–10.4)
CHLORIDE: 101 mmol/L (ref 98–107)
CO2: 31 mmol/L (ref 20.0–31.0)
CREATININE: 0.74 mg/dL
EGFR CKD-EPI (2021) MALE: >90 mL/min/{1.73_m2} (ref >=60)
GLUCOSE RANDOM: 206 mg/dL — ABNORMAL HIGH (ref 70–179)
POTASSIUM: 3.7 mmol/L (ref 3.4–4.8)
SODIUM: 140 mmol/L (ref 135–145)

## 2021-08-02 LAB — HEPATIC FUNCTION PANEL
ALBUMIN: 3.1 g/dL — ABNORMAL LOW (ref 3.4–5.0)
ALKALINE PHOSPHATASE: 89 U/L (ref 46–116)
ALT (SGPT): 36 U/L (ref 10–49)
AST (SGOT): 14 U/L (ref <=34)
BILIRUBIN DIRECT: 0.2 mg/dL (ref 0.00–0.30)
BILIRUBIN TOTAL: 0.3 mg/dL (ref 0.3–1.2)
PROTEIN TOTAL: 6.1 g/dL (ref 5.7–8.2)

## 2021-08-02 LAB — PHOSPHORUS: PHOSPHORUS: 4.6 mg/dL (ref 2.4–5.1)

## 2021-08-02 MED ORDER — ONDANSETRON 4 MG DISINTEGRATING TABLET
ORAL_TABLET | Freq: Three times a day (TID) | ORAL | 0 refills | 3 days | Status: CP | PRN
Start: 2021-08-02 — End: 2021-08-09

## 2021-08-02 MED ORDER — AMOXICILLIN 875 MG-POTASSIUM CLAVULANATE 125 MG TABLET
ORAL_TABLET | Freq: Two times a day (BID) | ORAL | 0 refills | 10 days | Status: CP
Start: 2021-08-02 — End: 2021-08-12

## 2021-08-02 MED ORDER — OXYCODONE 5 MG TABLET
ORAL_TABLET | Freq: Three times a day (TID) | ORAL | 0 refills | 2 days | Status: CP | PRN
Start: 2021-08-02 — End: 2021-08-05

## 2021-08-02 MED ADMIN — phytonadione (vitamin K1) (MEPHYTON) tablet 5 mg: 5 mg | ORAL | @ 14:00:00 | Stop: 2021-08-02

## 2021-08-02 MED ADMIN — sodium chloride 7% nebulizer solution 4 mL: 4 mL | RESPIRATORY_TRACT | @ 02:00:00

## 2021-08-02 MED ADMIN — elexacaftor-tezacaftor-ivacaft (TRIKAFTA) tablet 2 tablet: 2 | ORAL | @ 14:00:00 | Stop: 2021-08-02

## 2021-08-02 MED ADMIN — magnesium sulfate in D5W 1 gram/100 mL infusion 1 g: 1 g | INTRAVENOUS | @ 15:00:00 | Stop: 2021-08-02

## 2021-08-02 MED ADMIN — cefTAZidime (FORTAZ) 2 g in sodium chloride 0.9 % (NS) 100 mL IVPB-MBP: 2 g | INTRAVENOUS | @ 03:00:00 | Stop: 2021-08-13

## 2021-08-02 MED ADMIN — doxycycline (VIBRA-TABS) tablet/capsule 100 mg: 100 mg | ORAL | @ 03:00:00 | Stop: 2021-08-06

## 2021-08-02 MED ADMIN — gabapentin (NEURONTIN) capsule 600 mg: 600 mg | ORAL | @ 14:00:00 | Stop: 2021-08-02

## 2021-08-02 MED ADMIN — melatonin tablet 10.5 mg: 10 mg | ORAL | @ 03:00:00

## 2021-08-02 MED ADMIN — amitriptyline (ELAVIL) tablet 100 mg: 100 mg | ORAL | @ 03:00:00

## 2021-08-02 MED ADMIN — cefTAZidime (FORTAZ) 2 g in sodium chloride 0.9 % (NS) 100 mL IVPB-MBP: 2 g | INTRAVENOUS | @ 11:00:00 | Stop: 2021-08-02

## 2021-08-02 MED ADMIN — tobramycin (PF) (TOBI) 300 mg/5 mL nebulizer solution 300 mg: 300 mg | RESPIRATORY_TRACT | @ 02:00:00 | Stop: 2021-08-27

## 2021-08-02 MED ADMIN — albuterol 2.5 mg /3 mL (0.083 %) nebulizer solution 2.5 mg: 2.5 mg | RESPIRATORY_TRACT | @ 14:00:00 | Stop: 2021-08-02

## 2021-08-02 MED ADMIN — traZODone (DESYREL) tablet 100 mg: 100 mg | ORAL | @ 03:00:00

## 2021-08-02 MED ADMIN — pediatric multivitamin-vit D3 1,500 unit-vit K 800 mcg (MVW COMPLETE FORMULATION) capsule: 2 | ORAL | @ 03:00:00

## 2021-08-02 MED ADMIN — FLUoxetine (PROzac) tablet/capsule 60 mg: 60 mg | ORAL | @ 14:00:00 | Stop: 2021-08-02

## 2021-08-02 MED ADMIN — sodium chloride 7% nebulizer solution 4 mL: 4 mL | RESPIRATORY_TRACT | @ 14:00:00 | Stop: 2021-08-02

## 2021-08-02 MED ADMIN — albuterol 2.5 mg /3 mL (0.083 %) nebulizer solution 2.5 mg: 2.5 mg | RESPIRATORY_TRACT | @ 02:00:00

## 2021-08-02 MED ADMIN — pancrelipase (Lip-Prot-Amyl) (CREON) 24,000-76,000 -120,000 unit delayed release capsule 288,000 units of lipase: 12 | ORAL | @ 14:00:00 | Stop: 2021-08-02

## 2021-08-02 MED ADMIN — doxycycline (VIBRA-TABS) tablet/capsule 100 mg: 100 mg | ORAL | @ 14:00:00 | Stop: 2021-08-02

## 2021-08-02 MED ADMIN — pantoprazole (PROTONIX) EC tablet 20 mg: 20 mg | ORAL | @ 14:00:00 | Stop: 2021-08-02

## 2021-08-02 MED ADMIN — oxyCODONE (ROXICODONE) immediate release tablet 5 mg: 5 mg | ORAL | @ 03:00:00 | Stop: 2021-08-01

## 2021-08-02 MED ADMIN — pediatric multivitamin-vit D3 1,500 unit-vit K 800 mcg (MVW COMPLETE FORMULATION) capsule: 2 | ORAL | @ 14:00:00 | Stop: 2021-08-02

## 2021-08-02 MED ADMIN — rivaroxaban (XARELTO) tablet 20 mg: 20 mg | ORAL | @ 14:00:00 | Stop: 2021-08-02

## 2021-08-02 MED ADMIN — cetirizine (ZyrTEC) tablet 10 mg: 10 mg | ORAL | @ 14:00:00 | Stop: 2021-08-02

## 2021-08-02 MED ADMIN — insulin lispro (HumaLOG) injection 35 Units: 35 [IU] | SUBCUTANEOUS | @ 14:00:00 | Stop: 2021-08-02

## 2021-08-02 MED ADMIN — dornase alfa (PULMOZYME) 1 mg/mL solution 2.5 mg: 2.5 mg | RESPIRATORY_TRACT | @ 15:00:00 | Stop: 2021-08-02

## 2021-08-02 MED ADMIN — insulin lispro (HumaLOG) injection 0-20 Units: 0-20 [IU] | SUBCUTANEOUS | @ 03:00:00

## 2021-08-02 MED ADMIN — magnesium sulfate in D5W 1 gram/100 mL infusion 1 g: 1 g | INTRAVENOUS | @ 16:00:00 | Stop: 2021-08-02

## 2021-08-02 MED ADMIN — tobramycin (PF) (TOBI) 300 mg/5 mL nebulizer solution 300 mg: 300 mg | RESPIRATORY_TRACT | @ 15:00:00 | Stop: 2021-08-02

## 2021-08-02 MED ADMIN — gabapentin (NEURONTIN) capsule 600 mg: 600 mg | ORAL | @ 03:00:00

## 2021-08-02 MED ADMIN — fluticasone furoate-vilanteroL (BREO ELLIPTA) 200-25 mcg/dose inhaler 1 puff: 1 | RESPIRATORY_TRACT | @ 14:00:00 | Stop: 2021-08-02

## 2021-08-02 MED ADMIN — fluticasone propionate (FLONASE) 50 mcg/actuation nasal spray 2 spray: 2 | NASAL | @ 14:00:00 | Stop: 2021-08-02

## 2021-08-02 MED ADMIN — lamoTRIgine (LaMICtal) tablet 400 mg: 400 mg | ORAL | @ 14:00:00 | Stop: 2021-08-02

## 2021-08-02 MED ADMIN — insulin glargine (LANTUS) injection 46 Units: 46 [IU] | SUBCUTANEOUS | @ 03:00:00

## 2021-08-02 MED ADMIN — insulin lispro (HumaLOG) injection 0-20 Units: 0-20 [IU] | SUBCUTANEOUS | @ 14:00:00 | Stop: 2021-08-02

## 2021-08-02 MED ADMIN — atorvastatin (LIPITOR) tablet 10 mg: 10 mg | ORAL | @ 03:00:00

## 2021-08-02 MED ADMIN — montelukast (SINGULAIR) tablet 10 mg: 10 mg | ORAL | @ 03:00:00

## 2021-08-02 MED ADMIN — polyethylene glycol (MIRALAX) packet 17 g: 17 g | ORAL | @ 14:00:00 | Stop: 2021-08-02

## 2021-08-02 MED ADMIN — magnesium sulfate in D5W 1 gram/100 mL infusion 1 g: 1 g | INTRAVENOUS | @ 14:00:00 | Stop: 2021-08-02

## 2021-08-02 NOTE — Unmapped (Signed)
Patient magnesium 1.5 this morning, received replacement per order. Discharge instructions reviewed with patient, port flushed with heparin prior to needle removal. Ambulated off unit with wife to home with self care.    Problem: Adult Inpatient Plan of Care  Goal: Plan of Care Review  Outcome: Resolved  Goal: Patient-Specific Goal (Individualized)  Outcome: Resolved  Goal: Absence of Hospital-Acquired Illness or Injury  Outcome: Resolved  Intervention: Identify and Manage Fall Risk  Recent Flowsheet Documentation  Taken 08/02/2021 0839 by Sheran Spine, RN  Safety Interventions:   bleeding precautions   commode/urinal/bedpan at bedside   fall reduction program maintained   family at bedside   infection management   isolation precautions   lighting adjusted for tasks/safety   low bed   nonskid shoes/slippers when out of bed  Intervention: Prevent Skin Injury  Recent Flowsheet Documentation  Taken 08/02/2021 0839 by Sheran Spine, RN  Skin Protection: adhesive use limited  Intervention: Prevent and Manage VTE (Venous Thromboembolism) Risk  Recent Flowsheet Documentation  Taken 08/02/2021 0839 by Sheran Spine, RN  Activity Management: activity adjusted per tolerance  VTE Prevention/Management:   ambulation promoted   fluids promoted  Intervention: Prevent Infection  Recent Flowsheet Documentation  Taken 08/02/2021 0839 by Sheran Spine, RN  Infection Prevention:   cohorting utilized   rest/sleep promoted  Goal: Optimal Comfort and Wellbeing  Outcome: Resolved  Goal: Readiness for Transition of Care  Outcome: Resolved  Goal: Rounds/Family Conference  Outcome: Resolved     Problem: Infection  Goal: Absence of Infection Signs and Symptoms  Outcome: Resolved  Intervention: Prevent or Manage Infection  Recent Flowsheet Documentation  Taken 08/02/2021 0839 by Sheran Spine, RN  Infection Management: aseptic technique maintained  Isolation Precautions: contact precautions maintained     Problem: Diabetes Comorbidity  Goal: Blood Glucose Level Within Targeted Range  Outcome: Resolved  Intervention: Monitor and Manage Glycemia  Recent Flowsheet Documentation  Taken 08/02/2021 0839 by Sheran Spine, RN  Glycemic Management:   blood glucose monitored   supplemental insulin given

## 2021-08-02 NOTE — Unmapped (Signed)
Physician Discharge Summary [facility]  [facility]  [address]  [place] [address]  Dept: [phone]  Loc: [phone]     Identifying Information:   Christian Young  [birth year]  [id number]    Primary Care Physician: [facility]   Code Status: Full Code    Admit Date: 07/29/2021    Discharge Date: 08/02/2021     Discharge To: Home    Discharge Service: [facility]     Discharge Attending Physician: Lillia Carmel, MD    Discharge Diagnoses:  Active Problems:    Mood disorder (CMS-HCC) POA: Yes    Pancreatic insufficiency due to cystic fibrosis (CMS-HCC) POA: Yes    Bronchiectasis (CMS-HCC) POA: Yes    Chronic deep vein thrombosis (DVT) of lower extremity (CMS-HCC) POA: Yes    Cystic fibrosis with pulmonary exacerbation (CMS-HCC) POA: Yes    Cystic fibrosis exacerbation (CMS-HCC) POA: Unknown    AKI (acute kidney injury) (CMS-HCC) POA: Unknown    Sepsis (CMS-HCC) POA: Unknown  Resolved Problems:    * No resolved hospital problems. *      Outpatient Provider Follow Up Issues:   [ ]  Repeat LFTs outpt     Hospital Course:   Christian Young is a 35 y.o. male with CF,  with CF associated diabetes presenting with cough and fatigue concerning for CF exacerbation, hospitalization complicated by hypotension and AKI.    Hypotension, possible sepsis-the patient had episode of hypotension on the night after admission.  Rapid was called for this.  This improved after given 3 L of IV fluids.  Lactate was elevated developed AKI at the time.  Blood cultures were obtained and negative.  Received dose of daptomycin since port was placed which was stopped after 24 hours as quickly improved with holding lisinopril, stopping IV morphine, giving more IV fluids.  No obvious source of sepsis aside from possible CF exacerbation identified.    CF exacerbation-past cultures have grown MSSA and mucoid and smooth Pseudomonas aeruginosa (S to ceftaz, pip/tazo, tobramycin, as well as pansensitive stenotrophomonas.  Last hospitalized in November.  Last FEV1 11/03 68. Did not have significant sputum production during hospitalization. CXR with Mild bibasilar atelectasi and small R sided pleural effusion seen in past. Treated initially with Pipercillin/tazobactam and IV tobramycin which was transitioned with AKI to ceftazidime and doxycycline and tobramycin nebulizer treatments.  CRP level was  5. Also completed airway clearance hypertonic nebs, chest vest, with Pulmozyme.  Continued home Trikafta.  Pulmonary function tests obtained showing FEV1 69% (stable from prior).  Discussed with pulmonology who saw during admission and ultimately plan to discharge with 10 day of Augmentin given clinical stability.  Was also given p.o. vitamin K while on IV antibiotics. Sent home with short course of PRN oxycodone for chest wall pain.     Right upper quadrant pain, LFT abnormality.-Patient had mild right upper quadrant pain with negative Murphy sign.  Right upper quadrant ultrasound with mild dilation of the gallbladder which could be related to n.p.o. status but normal, bile duct, and no other signs of cholecystitis.  A patient continues have mild right upper quadrant pain and no progressive symptoms or nausea or vomiting.  Had mild LFT abnormalities which improved throughout admission.  He was clinically monitor.  Due to persistent RUQ pain, RUQ obtained which was negative for acute cholecystitis. HIDA negative. LFTs should be repeated outpatient.    AKI, past history of AIN-developed AKI with creatinine peaking at  1.79 after episode of hypotension.  History of AIN on Pip/Tazo.  Creatinine quickly down trended to baseline after IV fluids.  Pip/tazo and IV tobramycin stopped. Cr improved to baseline 0.74 by discharge.     CF associated diabetes-last hemoglobin A1c 9.  Uses Omni pod at home.  Lantus and lispro increased during hospitalization due to hyperglycemia to 46u night and 35u TIDAC. He will resume Omnipod on discharge.    Chronic medical issues:  Mood disorder- continue home fluoxetine 60mg  daily, amitriptyline 100mg  daily, and lamotrigine 400mg  daily  History of DVT- continue home Xarelto 20mg  daily  GERD- continue home pantoprazole 20mg  daily  HTN- Held lisinopril with hypotension and AKI, resumed on discharge    RLS- continue home mirapex 0.25mg  nightly, decreased trazodone to 100mg  considering low BP   Chronic back pain- continue gabapentin 600mg  TID        Procedures:  No admission procedures for hospital encounter.  ______________________________________________________________________  Discharge Medications:     Your Medication List      STOP taking these medications    doxycycline 100 MG tablet  Commonly known as: VIBRA-TABS        START taking these medications    acetaminophen 500 MG tablet  Commonly known as: TYLENOL  Take 1 tablet (500 mg total) by mouth every six (6) hours as needed.     amoxicillin-clavulanate 875-125 mg per tablet  Commonly known as: AUGMENTIN  Take 1 tablet by mouth Two (2) times a day for 10 days.     ondansetron 4 MG disintegrating tablet  Commonly known as: ZOFRAN-ODT  Take 1 tablet (4 mg total) by mouth every eight (8) hours as needed for up to 7 days.     oxyCODONE 5 MG immediate release tablet  Commonly known as: ROXICODONE  Take 1 tablet (5 mg total) by mouth every eight (8) hours as needed for up to 3 days.        CHANGE how you take these medications    dornase alfa 1 mg/mL nebulizer solution  Commonly known as: PULMOZYME  Inhale 2.5 mg daily.  What changed: when to take this     insulin ASPART 100 unit/mL injection  Commonly known as: NovoLOG U-100 Insulin aspart  Subcutaneously infuse up to 150 units daily via insulin pump  What changed: Another medication with the same name was removed. Continue taking this medication, and follow the directions you see here.     MVW COMPLETE FORMUL PROBIOTIC 40 billion cell -15 mg Cpdr  Generic drug: Lacto-Bif-Sac-Bacil-Strep-bact  Take 1 capsule by mouth daily.  What changed: when to take this        CONTINUE taking these medications    albuterol 90 mcg/actuation inhaler  Commonly known as: PROVENTIL HFA;VENTOLIN HFA  Inhale 2 puffs every six (6) hours as needed.     albuterol 2.5 mg /3 mL (0.083 %) nebulizer solution  Inhale 3 mL (2.5 mg total) by nebulization two (2) times a day.     amitriptyline 100 MG tablet  Commonly known as: ELAVIL  TAKE ONE TABLET BY MOUTH AT BEDTIME     atorvastatin 20 MG tablet  Commonly known as: LIPITOR  Take 20 mg by mouth nightly.     blood-glucose meter kit  Dispense meter that is preferred by patient's insurance company     budesonide-formoteroL 160-4.5 mcg/actuation inhaler  Commonly known as: SYMBICORT  Inhale 2 puffs Two (2) times a day.     cetirizine 10 MG tablet  Commonly known as: ZyrTEC  Take 1 tablet (10 mg total) by mouth daily.     codeine-guaifenesin 10-100 mg/5 mL liquid  Commonly known as: GUAIFENESIN AC  Take 5 mL by mouth Three (3) times a day as needed for cough.     DEXCOM G6 RECEIVER Misc  Generic drug: blood-glucose meter,continuous  Use as directed     DEXCOM G6 SENSOR Devi  Generic drug: blood-glucose sensor  Use sq as directed every 10 days     DEXCOM G6 TRANSMITTER Devi  Generic drug: blood-glucose transmitter  Use as directed, replace every 90 days     EASY TOUCH LANCING DEVICE Misc  Generic drug: lancing device  Use as directed.     EASY TOUCH TWIST LANCETS 30 gauge Misc  Generic drug: lancets  Use as directed.     FLUoxetine 60 mg Tab  Take 60 mg by mouth daily.     fluticasone propionate 50 mcg/actuation nasal spray  Commonly known as: FLONASE  2 sprays into each nostril daily.     FREESTYLE LIBRE 14 DAY SENSOR kit  Generic drug: flash glucose sensor  Use 3-4 times daily     gabapentin 600 MG tablet  Commonly known as: NEURONTIN  Take 600 mg by mouth Three (3) times a day.     glucose blood Strp  Generic drug: blood sugar diagnostic  by Other route Three (3) times a day with a meal. Rx sent to [facility] 01/04/20     lamoTRIgine 200 MG tablet  Commonly known as: LaMICtal  Take 400 mg by mouth daily.     LC PLUS Misc  Generic drug: nebulizers  Use as directed with inhaled medications     lipase-protease-amylase 36,000-114,000- 180,000 unit Cpdr  Commonly known as: CREON  Take 8 caps by mouth with meals three times daily and 4 caps with snacks up to three times a day.     lisinopriL 10 MG tablet  Commonly known as: PRINIVIL,ZESTRIL  Take 1 tablet (10 mg total) by mouth daily.     melatonin 10 mg Tab  Take 10 mg by mouth nightly as needed.     montelukast 10 mg tablet  Commonly known as: SINGULAIR  Take 1 tablet (10 mg total) by mouth nightly.     MVW Complete (pediatric multivit 61-D3-vit K) 1,500-800 unit-mcg Cap  Commonly known as: MVW COMPLETE FORMULATION  Take 2 capsules by mouth Two (2) times a day.     omeprazole 20 MG capsule  Commonly known as: PriLOSEC  Take 1 capsule (20 mg total) by mouth in the morning.     OMNIPOD 5 G6 PODS (GEN 5) Crtg  Generic drug: insulin pump cart,automated,BT  1 each by subcutaneous (via wearable injector) route every three (3) days. Change every 72 hour     pen needle, diabetic 32 gauge x 5/32 (4 mm) Ndle  Commonly known as: BD ULTRA-FINE NANO PEN NEEDLE  use up to 4 times daily     pramipexole 0.125 MG tablet  Commonly known as: MIRAPEX  Take 0.25 mg by mouth nightly.     sodium chloride 7% 7 % Nebu  Inhale 4 mL by nebulization two (2) times a day.     traZODone 150 MG tablet  Commonly known as: DESYREL  Take 150 mg by mouth nightly.     TRIKAFTA 100-50-75 mg(d) /150 mg (n) tablet  Generic drug: elexacaftor-tezacaftor-ivacaft  Take 2 Tablets (orange) by mouth in the morning and 1 tablet (blue)  in the evening with fatty food     XARELTO 20 mg tablet  Generic drug: rivaroxaban  Take 20 mg by mouth daily.            Allergies:  Aztreonam, [place] [aztreonam lysine], Cefepime, Other, Slo-bid 100, Tobramycin, and Banana  ______________________________________________________________________  Pending Test Results (if blank, then none):  Pending Labs     Order Current Status    Blood Culture #1 Preliminary result    Blood Culture #2 Preliminary result          Most Recent Labs:  All lab results last 24 hours -   Recent Results (from the past 24 hour(s))   Flow volume loop    Collection Time: 08/01/21  1:45 PM   Result Value Ref Range    FVC PRE 4.48 (L) 4.62 - 6.91 L    FEV1 PRE 3.27 (L) 3.73 - 5.61 L    FEV1/FVC PRE 73.17 71.09 - 90.60 %    FEV6 PRE 4.26 (L) 4.71 - 6.63 L    FEV1/FEV6 PRE 76.94 73.81 - 91.75 %    FEF25-75% PRE 2.27 (L) 2.82 - 6.87 L/s    ISOFEF25-75 PRE 2.27 L/s    FEF50% PRE 3.16 (L) 3.39 - 7.73 L/s    PEF PRE 9.77 8.26 - 13.16 L/s    FET100% Change 12.05 sec    FIVC PRE 3.37 (L) 4.62 - 6.91 L    FIF50% PRED 8.72 L/s    GNF/AOZ30 pre 37.14 %    PIF PRE 9.64 5.96 - 13.08 L/s    Vol extrap pre 0.06 L   POCT Glucose    Collection Time: 08/01/21  2:56 PM   Result Value Ref Range    Glucose, POC 284 (H) 70 - 179 mg/dL   POCT Glucose    Collection Time: 08/01/21  5:49 PM   Result Value Ref Range    Glucose, POC 154 70 - 179 mg/dL   POCT Glucose    Collection Time: 08/01/21  9:05 PM   Result Value Ref Range    Glucose, POC 217 (H) 70 - 179 mg/dL   Hepatic Function Panel    Collection Time: 08/02/21  6:20 AM   Result Value Ref Range    Albumin 3.1 (L) 3.4 - 5.0 g/dL    Total Protein 6.1 5.7 - 8.2 g/dL    Total Bilirubin 0.3 0.3 - 1.2 mg/dL    Bilirubin, Direct 8.65 0.00 - 0.30 mg/dL    AST 14 <=78 U/L    ALT 36 10 - 49 U/L    Alkaline Phosphatase 89 46 - 116 U/L   Basic Metabolic Panel    Collection Time: 08/02/21  6:20 AM   Result Value Ref Range    Sodium 140 135 - 145 mmol/L    Potassium 3.7 3.4 - 4.8 mmol/L    Chloride 101 98 - 107 mmol/L    CO2 31.0 20.0 - 31.0 mmol/L    Anion Gap 8 5 - 14 mmol/L    BUN 12 9 - 23 mg/dL    Creatinine 4.69 6.29 - 1.10 mg/dL    BUN/Creatinine Ratio 16     eGFR CKD-EPI (2021) Male >90 >=60 mL/min/1.30m2    Glucose 206 (H) 70 - 179 mg/dL    Calcium 8.7 8.7 - 52.8 mg/dL   Magnesium Level    Collection Time: 08/02/21  6:20 AM   Result Value Ref Range    Magnesium 1.5 (L) 1.6 - 2.6 mg/dL  Phosphorus Level    Collection Time: 08/02/21  6:20 AM   Result Value Ref Range    Phosphorus 4.6 2.4 - 5.1 mg/dL   CBC w/ Differential    Collection Time: 08/02/21  6:20 AM   Result Value Ref Range    WBC 6.0 3.6 - 11.2 10*9/L    RBC 4.31 4.26 - 5.60 10*12/L    HGB 11.5 (L) 12.9 - 16.5 g/dL    HCT 16.1 (L) 09.6 - 48.0 %    MCV 81.0 77.6 - 95.7 fL    MCH 26.8 25.9 - 32.4 pg    MCHC 33.1 32.0 - 36.0 g/dL    RDW 04.5 (H) 40.9 - 15.2 %    MPV 9.0 6.8 - 10.7 fL    Platelet 164 150 - 450 10*9/L    Neutrophils % 59.2 %    Lymphocytes % 28.0 %    Monocytes % 9.8 %    Eosinophils % 2.3 %    Basophils % 0.7 %    Absolute Neutrophils 3.6 1.8 - 7.8 10*9/L    Absolute Lymphocytes 1.7 1.1 - 3.6 10*9/L    Absolute Monocytes 0.6 0.3 - 0.8 10*9/L    Absolute Eosinophils 0.1 0.0 - 0.5 10*9/L    Absolute Basophils 0.0 0.0 - 0.1 10*9/L    Anisocytosis Slight (A) Not Present    Hypochromasia Slight (A) Not Present   POCT Glucose    Collection Time: 08/02/21  8:00 AM   Result Value Ref Range    Glucose, POC 212 (H) 70 - 179 mg/dL       Relevant Studies/Radiology (if blank, then none):  ECG 12 Lead    Result Date: 07/29/2021  NORMAL SINUS RHYTHM NORMAL ECG WHEN COMPARED WITH ECG OF 08-Jun-2021 15:27, RATE HAS DECREASED , OTHERWISE NO SIGNIFICANT CHANGE WAS FOUND Confirmed by Schuyler Amor ([id number]) on 07/29/2021 4:03:52 PM    XR Chest Portable    Result Date: 07/30/2021  EXAM: XR CHEST PORTABLE DATE: 07/30/2021 8:43 AM ACCESSION: [id number] UN DICTATED: 07/30/2021 10:33 AM INTERPRETATION LOCATION: [facility] CLINICAL INDICATION: 35 years old Male with SHORTNESS OF BREATH  COMPARISON: Chest radiograph 07/29/2021. TECHNIQUE: Portable Chest Radiograph. FINDINGS: Left-sided Port-A-Cath again noted. Mild bibasilar atelectasis redemonstrated. Small right-sided pleural effusion is stable. There is no pneumothorax. Stable cardiomediastinal silhouette.     Stable exam.    NM Hepatobiliary    Result Date: 08/01/2021  EXAM: NM HEPATOBILIARY DATE: 08/01/2021 4:26 PM ACCESSION: [id number] UN DICTATED: 08/01/2021 4:33 PM INTERPRETATION LOCATION: [facility] CLINICAL INDICATION: 35 years old Male: r/u cholecystitis  RADIOPHARMACEUTICAL: Tc-29m mebrofenin (Choletec), 5.4 mCi TECHNIQUE: Following injection of radiopharmaceutical, anterior and right lateral projection static images were acquired at 1.5 hours. COMPARISON: None FINDINGS: -There is radiotracer in the gallbladder and small bowel, excluding acute cholecystitis. Apparent radiotracer in the bladder could reflect decreased hepatic function.     No evidence of acute cholecystitis.    US Abdomen Complete    Result Date: 07/30/2021  EXAM: US ABDOMEN COMPLETE DATE: 07/30/2021 ACCESSION: [id number] UN DICTATED: 07/30/2021 10:03 AM INTERPRETATION LOCATION: [facility] CLINICAL INDICATION: 35 years old Male with evaluate for cholecystitis, also evaluate for hydro  COMPARISON: Outside CT abdomen pelvis 08/28/2020 TECHNIQUE: Static and cine images of the complete abdomen were performed. FINDINGS: LIVER: The liver was increased in echogenicity. No focal hepatic lesions. No intrahepatic biliary ductal dilatation. The common bile duct was normal in caliber. GALLBLADDER: The gallbladder was distended without obstructing stones or sludge. Sonographic Murphy sign was negative.  No  pericholecystic fluid. No gallbladder wall thickening. PANCREAS: Not well visualized due to overlying bowel gas. SPLEEN: Normal in size and echotexture. KIDNEYS: Normal in size and echotexture. No solid masses or calculi. No hydronephrosis. VESSELS: Proximal aorta and IVC were well visualized due to overlying bowel gas. OTHER: No ascites. Please see below for data measurements: Liver: 19.2 cm Proximal common bile duct: 0.36 cm Gallbladder wall:  0.12 cm Sonographic Murphy's Sign: negative Pericholecystic fluid visualized: no Right kidney: 12.4 cm Left kidney: 11.5 cm Aorta: not well visualized Inferior vena cava: partially visualized Spleen: 12.4 cm     -Distended gallbladder. No additional findings to suggest acute cholecystitis. This may simply be related to prolonged nothing by mouth status. Recommend clinical correlation. HIDA scan can be obtained if further evaluation is needed. -The kidneys are well visualized. No hydronephrosis, as clinically questioned. -Echogenic liver, suggesting hepatic steatosis.    XR Chest 2 views    Result Date: 07/30/2021  EXAM: XR CHEST 2 VIEWS DATE: 07/29/2021 4:44 PM ACCESSION: [id number] UN DICTATED: 07/29/2021 5:41 PM INTERPRETATION LOCATION: [facility] CLINICAL INDICATION: 35 years old Male with COUGH  COMPARISON: Chest radiograph dated 06/08/2021 TECHNIQUE: PA and Lateral Chest Radiographs. FINDINGS: Left chest wall Port-A-Cath with tip terminating within the right atrium. Small right-sided pleural effusion with adjacent patchy opacity. Left basilar linear opacity. Unremarkable cardiomediastinal silhouette.     Small right-sided pleural effusion with adjacent patchy opacity favored to represent atelectasis, though superimposed infection cannot be excluded. Left basilar linear opacity likely representing atelectasis.    ______________________________________________________________________  Discharge Instructions:     Diet Instructions     Discharge diet (specify)      Discharge Nutrition Therapy: Consistent Carb    Consistent Carb Level: Consistent Carb 60/60/60 (4/4/4)              Other Instructions     Call MD for:  difficulty breathing, headache or visual disturbances      Call MD for:  persistent dizziness or light-headedness      Call MD for:  persistent nausea or vomiting      Call MD for:  severe uncontrolled pain      Call MD for:  temperature >38.5 Celsius      Discharge instructions Resume overall care through PCP and pulmonologist.    Discharge instructions to patient: Call your primary care doctor and make an appointment to see them:      Within 1 week from the time you are discharged from the hospital. Repeat liver enzyme labwork at this visit.          Appointments which have been scheduled for you    Aug 22, 2021 10:45 AM  (Arrive by 10:30 AM)  RETURN PFT 15 with PFT 5  [facility] PULMONARY SPECIALTY FUNCT [place] [facility]) [address]  [place] [address]  [phone]      Aug 22, 2021 12:00 PM  (Arrive by 11:45 AM)  RETURN CYSTIC FIBROSIS with Satira Sark, MD  [facility] PULMONARY SPECIALTY CL [place] [facility] REGION) [address]  [place] [address]  [phone]      Sep 07, 2021  8:40 AM  (Arrive by 8:25 AM)  RETURN  DIABETES with Harrie Foreman, MD  [facility] DIABETES AND ENDOCRINOLOGY [place] [facility]) [address]  [place] [address]  [phone]           ______________________________________________________________________  Discharge Day  Services:  BP 126/79  - Pulse 77  - Temp 36.3 ??C (97.4 ??F) (Axillary)  - Resp 18  - Ht 182.9 cm (6')  - Wt (!) 111.1 kg (245 lb)  - SpO2 94%  - BMI 33.23 kg/m??   Pt seen on the day of discharge and determined appropriate for discharge.    Condition at Discharge: stable    Length of Discharge: I spent greater than 30 mins in the discharge of this patient.

## 2021-08-02 NOTE — Unmapped (Addendum)
Pt VSS, afebrile, A/Ox4. Pt made NPO for hepatobiliary nuclear medicine test d/t continued RUQ abdominal pain. Pt also went for pulmonary function test. Pt received two PRN doses oxycodone for pain with limited effect. Pt free of falls or injuries with all safety measures maintained.   Problem: Adult Inpatient Plan of Care  Goal: Plan of Care Review  Outcome: Ongoing - Unchanged  Goal: Patient-Specific Goal (Individualized)  Outcome: Ongoing - Unchanged  Goal: Absence of Hospital-Acquired Illness or Injury  Outcome: Ongoing - Unchanged  Intervention: Identify and Manage Fall Risk  Recent Flowsheet Documentation  Taken 08/01/2021 0730 by Jimmy Picket, RN  Safety Interventions:  ??? low bed  ??? lighting adjusted for tasks/safety  ??? fall reduction program maintained  ??? infection management  ??? isolation precautions  ??? room near unit station  Intervention: Prevent Skin Injury  Recent Flowsheet Documentation  Taken 08/01/2021 0730 by Jimmy Picket, RN  Skin Protection:  ??? adhesive use limited  ??? cleansing with dimethicone incontinence wipes  Intervention: Prevent and Manage VTE (Venous Thromboembolism) Risk  Recent Flowsheet Documentation  Taken 08/01/2021 0730 by Jimmy Picket, RN  Activity Management: activity adjusted per tolerance  Intervention: Prevent Infection  Recent Flowsheet Documentation  Taken 08/01/2021 0730 by Jimmy Picket, RN  Infection Prevention:  ??? hand hygiene promoted  ??? personal protective equipment utilized  ??? rest/sleep promoted  ??? single patient room provided  Goal: Optimal Comfort and Wellbeing  Outcome: Ongoing - Unchanged  Goal: Readiness for Transition of Care  Outcome: Ongoing - Unchanged  Goal: Rounds/Family Conference  Outcome: Ongoing - Unchanged     Problem: Infection  Goal: Absence of Infection Signs and Symptoms  Outcome: Ongoing - Unchanged  Intervention: Prevent or Manage Infection  Recent Flowsheet Documentation  Taken 08/01/2021 0730 by Jimmy Picket, RN  Infection Management: aseptic technique maintained  Isolation Precautions: contact precautions maintained     Problem: Diabetes Comorbidity  Goal: Blood Glucose Level Within Targeted Range  Outcome: Ongoing - Unchanged

## 2021-08-02 NOTE — Unmapped (Signed)
[facility]   Care Management     Patient is a 35 y.o. admitted on 07/29/2021 for Cystic fibrosis exacerbation (CMS-HCC) [E84.9]. Per review of the medical record and discussion with the treating team, the patient does not meet indicators for a full assessment at this time. CM will continue to assess for discharge needs and follow up, as indicated.    Debbe Mounts   July 31, 2021 3:58 PM

## 2021-08-03 MED ORDER — OMNIPOD 5 G6 PODS (GEN 5) SUBCUTANEOUS CARTRIDGE
SUBCUTANEOUS | 12 refills | 0.00000 days
Start: 2021-08-03 — End: ?

## 2021-08-04 MED ORDER — OMNIPOD 5 G6 PODS (GEN 5) SUBCUTANEOUS CARTRIDGE
SUBCUTANEOUS | 12 refills | 0 days | Status: CP
Start: 2021-08-04 — End: ?

## 2021-08-21 NOTE — Unmapped (Signed)
Pulmonary Clinic - Follow-up Visit        The patient reports they are currently: at home. I spent 30 minutes on the real-time audio and video visit with the patient on the date of service. I spent an additional 20 minutes on pre- and post-visit activities on the date of service.     The patient was not located and I was located within 250 yards of a hospital based location during the real-time audio and video visit. The patient was physically located in [place] or a state in which I am permitted to provide care. The patient and/or parent/guardian understood that s/he may incur co-pays and cost sharing, and agreed to the telemedicine visit. The visit was reasonable and appropriate under the circumstances given the patient's presentation at the time.    The patient and/or parent/guardian has been advised of the potential risks and limitations of this mode of treatment (including, but not limited to, the absence of in-person examination) and has agreed to be treated using telemedicine. The patient's/patient's family's questions regarding telemedicine have been answered.    If the visit was completed in an ambulatory setting, the patient and/or parent/guardian has also been advised to contact their provider???s office for worsening conditions, and seek emergency medical treatment and/or call 911 if the patient deems either necessary.      ASSESSMENT     Christian Young is a 36 y.o. male with cystic fibrosis [phone] and [phone] insertion) who presents to clinic for routine CF follow-up. He has had 3 hospitalizations since last visit with [name] in 01/2021 (one for COVID in 03/2021, one for CF exacerbation in 05/2021, one for cough and fatigue concerning for CF exacerbation in 07/2021); also with AKI and bump in LFTs during last hospital admission (med related, also had some hypotension initially in his admission) and he wants to focus on his health. Recent elevated BP at home with a headache, will increase BP monitoring before making changes given recent hypotension and have him check if developing headache, symptoms, etc.     Will get him to see ENT and audiology to reassess hearing, and consider role for resuming tobi nebs given 3 hospitalizations in past 6 months to see if this can help his exacerbation frequency, once we have assessed hearing     Upcoming Endo appointment which will be helpful given recent higher sugars      Problem List Items Addressed This Visit    None      PLAN       Cystic fibrosis - Home FEV1 at 65% close to his baseline today 08/22/20  - Continue Trikafta - LFT's annually (last checked 11/2020, due at next visit ~11/2021)   - Continue home CF inhaled meds and airway clearance twice daily  - previously on TOBI nebs and used as part of treatment in hospital during last admission 07/2021 - does have history of tinnitus and hearing loss, will monitor frequency of CF exacerbations and consider if role to resume in future after assessment of hearing with audiology   -  Continue to monitor respiratory symptoms as he recovers    CF sinus disease  - referred to [facility] ENT last visit due to history of sinus disease (never scheduled),   - previously referred to Audiology   - will re-refer to both (assess hearing as above, sinus symptoms recently worse)     Pancreatic insufficiency secondary to cystic fibrosis  - Continue Creon and MVW vitamins   - plan  to check Vitamin A, D, E levels and total IgE level at next visit in 11/2021    GERD  - continue PPI Daily    CF related diabetes - Patient follows with [facility] for his CF-related diabetes - last A1c 9%, using Freestyle Libre  - is on ACEi for hypertension as well (lisinopril)    Hypertension - low BP during recent admission, but now with one elevated BP to 170s at home, with headache at that time.   - increase BP monitoring at home - some values before meds, some values on meds  - check BP if develops symptoms and let us know  - may need to adjust BP meds pending trend     At risk for CF bone disease  - last DEXA normal in 2018, repeat in 5 years (would be due this year, in 2023 - try to coordinate at next visit)    Mood disorder  - continue local follow up with psychiatry, things going wlel  - fluoxetine 60mg  daily, amitriptyline 100mg  daily, and lamotrigine 400mg  daily  - trazodone was deacreased during admission to 100 mg nightly due to hypotension    Chronic back pain  - gabapentin 600 mg TID    History of DVT  - xarelto 20 mg daily    Healthcare maintenance:  Immunization History   Administered Date(s) Administered   ??? COVID-19 VACC,MRNA,(PFIZER)(PF) 10/31/2019, 11/21/2019, 07/11/2020, 10/10/2020   ??? Influenza Nasal, Unspecified Formulation 06/14/2020   ??? Influenza Vaccine Quad (IIV4 PF) 33mo+ injectable 09/06/2015, 04/10/2019   ??? Influenza Vaccine Quad (IIV4 W/PRESERV) 51MO+ 05/22/2016, 04/18/2017, 06/20/2020   ??? Influenza Virus Vaccine PF(cciiv3) 22yrs+ from ce 06/02/2015   ??? Influenza Virus Vaccine, unspecified formulation 06/21/2016, 04/18/2017, 04/29/2018, 04/11/2019   ??? PNEUMOCOCCAL POLYSACCHARIDE 23 02/18/2013, 04/11/2019   ??? TdaP 01/12/2015   Thinks got flu shot at prevo drug  Thinks he got bivalent booster great     The patient will return to clinic in 3 months to see Dr. [name], or sooner as needed. Patient discussed with Dr. Janeth Rase, MD  [facility] Pediatric Pulmonary Fellow  [facility] Adult Pulmonary and Critical Care Fellow  [phone]    HISTORY:     Interval History:  Diagnosed at age 7 years old with chronic infections, had consolidation and parapneumonic effusion,sent to [facility] for treatment since then.   [id number] and [id number] insertion. 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     Christian Young presents to clinic today for routine CF follow-up. He was last admitted to [facility] from 07/29/21 to 08/02/21 for a possible CF exacerbation with hypotension as well as increased cough and fatigue - course complicated by AKI. He did not have significant sputum production, initially started on zosyn and IV tobramycin which he has been treated with in the past, but switched to ceftaz and doxy with Tobi nebs after AKI. CRP was low on admission - and PFTs on 08/01/21 were near his baseline at 69%. Discharged on 10 day course of augmentin given mild symptoms and clinical stability. Also discharged with short course of oxycodone for chest wall pain. Renal function improved with fluids and normalized by discharge.    He was also admitted 06/08/21-06/16/21 for CF exacerbation after 2 weeks of worsening symptoms at home even after initiation of levaquin, culminating in coughing fit leading to him passing out and possibly hitting head, home spirometry had registered at 47% - sputum culture at that time showing pan-S MSSA  and <1+ Mucoid pseudomonas - FEV1 was down to 62% on admission, he was treated with 10 days of zosyn and tobramycin with improvement in spirometry to 68% (near his baseline) and improvement in CRP from 45 down to 10 prior to discharge, and he completed IV course at home. CT head was negative for bleed given unwitnessed syncopal event at home on blood thinner    Prior to that had had one admission in August for COVID, treated with 3 days of remdesivir in consultation with ID, discharged after 8 days of IV antibiotics (zosyn, tobramycin) with 2 additional days of oral ciprofloxacin given rapid improvement as COVID symptoms resolved    Generally feeling OK - feeling really dizzy, and feels that stamina is still low. Feels overall unstable. Blood pressures at home have been running high, no issues with low. Highest 170/105. Has resumed his 10 mg of lisinopril daily. Hastn' typically had low BP when sick - blood cultures were negative. Eating and drinking ok since leaving the hospital.     He does have a cough currently that is dry, which is unusual. No chest pain, shortness of breath, or wheezing. He reports using his Albuterol and Hypertonic Saline twice daily and Pulmozyme daily at home. His sinuses are doing well, and he continues to use Flonase.      He has been doing well from a GI standpoint without abdominal pain, nausea, vomiting, diarrhea, or heartburn. His weight is stable at 228 lbs and he would still like to get down to 200 lbs. He takes his enzymes regularly. He takes 2 MVW D5000 vitamins twice a day and MVW probiotic daily.     He and his wife are living in a house on her sister's property in [place]. [name] was on FMLA since January 2021 due to his back pain and he was let go from [employer] due to this. Not working currently - work used to be a major excuse as to why he would miss breathing treatments  His wife works as an Museum/gallery exhibitions officer and is in school to be a Radiation protection practitioner.    Cystic fibrosis issues:  Sinuses: Uses Flonase and takes Claritin and Singulair. Sinuses have been rough recently, along with some ear pain    Airway clearance: Does albuterol, and 7% hypertonic saline twice a day.  Reports compliance to it.  Does Pulmozyme daily.  Uses the vest occasionally.     GI and nutrition: Denies any constipation or diarrhea. Taking Miralax.  Reports compliance to his Creon. Weight has been very up and down recently - weight before hospital was around 223, he always loses a lot of weight when sick/going into the hospital - has been regaining.    Hypertension: On lisinopril 10 mg daily. Was on Coreg before, which was started in 2018 as an inpatient for sinus tachycardia. Taking BP at least once a day now - when BP was higher, he was having a headache.     Diabetes: Followed and managed by endocrinology.  Reports compliance to his insulin. Sugars have been running a bit higher since being in the hospital -  Now has a Josephine Igo and it works much better for him than Dexcom.     Mental health issues: Following with local Psychiatrist. Mood has generally been good.     Past Medical History:   Diagnosis Date   ??? Anxiety    ??? Chronic pain disorder    ??? Cystic fibrosis (CMS-HCC)    ??? Depression    ???  Hypertension    ??? Lumbar radiculopathy 10/26/2020   ??? Nonproductive cough 04/05/2018     Past Surgical History:   Procedure Laterality Date   ??? IR INSERT PORT AGE GREATER THAN 5 YRS  03/27/2019    IR INSERT PORT AGE GREATER THAN 5 YRS 03/27/2019 Rush Barer, MD IMG VIR [facility]   ??? PR REMOVAL OF LUNG,LOBECTOMY Right 03/29/2017    Procedure: REMOVAL OF LUNG, OTHER THAN PNEUMONECTOMY; SINGLE LOBE (LOBECTOMY);  Surgeon: Cherie Dark, MD;  Location: MAIN OR [facility];  Service: Thoracic       Other History:  The social history and family history were personally reviewed and updated in the patient's electronic medical record.     Home Medications:  Current Outpatient Medications on File Prior to Visit   Medication Sig Dispense Refill   ??? acetaminophen (TYLENOL) 500 MG tablet Take 1 tablet (500 mg total) by mouth every six (6) hours as needed. 30 tablet 0   ??? albuterol (PROVENTIL HFA;VENTOLIN HFA) 90 mcg/actuation inhaler Inhale 2 puffs every six (6) hours as needed. 1 Inhaler 1   ??? albuterol 2.5 mg /3 mL (0.083 %) nebulizer solution Inhale 3 mL (2.5 mg total) by nebulization two (2) times a day. 540 mL 3   ??? amitriptyline (ELAVIL) 100 MG tablet TAKE ONE TABLET BY MOUTH AT BEDTIME (Patient taking differently: No sig reported) 30 tablet 1   ??? [EXPIRED] amoxicillin-clavulanate (AUGMENTIN) 875-125 mg per tablet Take 1 tablet by mouth Two (2) times a day for 10 days. 20 tablet 0   ??? atorvastatin (LIPITOR) 20 MG tablet Take 20 mg by mouth nightly.     ??? blood sugar diagnostic (GLUCOSE BLOOD) Strp by Other route Three (3) times a day with a meal. Rx sent to [facility] 01/04/20     ??? blood-glucose meter kit Dispense meter that is preferred by patient's insurance company 1 each 0   ??? blood-glucose meter,continuous (DEXCOM G6 RECEIVER) Misc Use as directed 1 each 0   ??? blood-glucose sensor (DEXCOM G6 SENSOR) Devi Use sq as directed every 10 days 9 each 11   ??? blood-glucose transmitter (DEXCOM G6 TRANSMITTER) Devi Use as directed, replace every 90 days 1 each 11   ??? budesonide-formoteroL (SYMBICORT) 160-4.5 mcg/actuation inhaler Inhale 2 puffs Two (2) times a day. 30.6 g 3   ??? cetirizine (ZYRTEC) 10 MG tablet Take 1 tablet (10 mg total) by mouth daily. 30 tablet 11   ??? codeine-guaifenesin (GUAIFENESIN AC) 10-100 mg/5 mL liquid Take 5 mL by mouth Three (3) times a day as needed for cough.     ??? dornase alfa (PULMOZYME) 1 mg/mL nebulizer solution Inhale 2.5 mg daily. 450 mL 3   ??? EASY TOUCH LANCING DEVICE Misc Use as directed.     ??? EASY TOUCH TWIST LANCETS 30 gauge Misc Use as directed.     ??? elexacaftor-tezacaftor-ivacaft (TRIKAFTA) 100-50-75 mg(d) /150 mg (n) tablet Take 2 Tablets (orange) by mouth in the morning and 1 tablet (blue) in the evening with fatty food 84 tablet 6   ??? FLUoxetine 60 mg Tab Take 60 mg by mouth daily.      ??? fluticasone propionate (FLONASE) 50 mcg/actuation nasal spray 2 sprays into each nostril daily.     ??? FREESTYLE LIBRE 14 DAY SENSOR kit Use 3-4 times daily     ??? gabapentin (NEURONTIN) 600 MG tablet Take 600 mg by mouth Three (3) times a day.     ??? insulin ASPART (NOVOLOG U-100  INSULIN ASPART) 100 unit/mL injection Subcutaneously infuse up to 150 units daily via insulin pump 150 mL 11   ??? insulin pump cart,automated,BT (OMNIPOD 5 G6 PODS, GEN 5,) Crtg 1 each by subcutaneous (via wearable injector) route every three (3) days. Change every 72 hour 10 each 12   ??? lamoTRIgine (LAMICTAL) 200 MG tablet Take 400 mg by mouth daily.     ??? lipase-protease-amylase (CREON) 36,000-114,000- 180,000 unit CpDR Take 8 caps by mouth with meals three times daily and 4 caps with snacks up to three times a day.     ??? lisinopriL (PRINIVIL,ZESTRIL) 10 MG tablet Take 1 tablet (10 mg total) by mouth daily. 30 tablet 0   ??? melatonin 10 mg Tab Take 10 mg by mouth nightly as needed.     ??? montelukast (SINGULAIR) 10 mg tablet Take 1 tablet (10 mg total) by mouth nightly. 90 tablet 3   ??? MVW COMPLETE FORMUL PROBIOTIC 40 billion cell -15 mg CpDR Take 1 capsule by mouth daily. (Patient taking differently: Take 1 capsule by mouth daily.) 90 capsule 3   ??? MVW Complete, pediatric multivit 61-D3-vit K, (MVW COMPLETE FORMULATION) 1,500-800 unit-mcg cap Take 2 capsules by mouth Two (2) times a day.     ??? nebulizers Misc Use as directed with inhaled medications 4 each 3   ??? omeprazole (PRILOSEC) 20 MG capsule Take 1 capsule (20 mg total) by mouth in the morning. 90 capsule 3   ??? [EXPIRED] ondansetron (ZOFRAN-ODT) 4 MG disintegrating tablet Take 1 tablet (4 mg total) by mouth every eight (8) hours as needed for up to 7 days. 8 tablet 0   ??? [EXPIRED] oxyCODONE (ROXICODONE) 5 MG immediate release tablet Take 1 tablet (5 mg total) by mouth every eight (8) hours as needed for up to 3 days. 6 tablet 0   ??? pen needle, diabetic (BD ULTRA-FINE NANO PEN NEEDLE) 32 gauge x 5/32 (4 mm) Ndle use up to 4 times daily 400 each 12   ??? pramipexole (MIRAPEX) 0.125 MG tablet Take 0.25 mg by mouth nightly.     ??? sodium chloride 7% 7 % Nebu Inhale 4 mL by nebulization two (2) times a day.     ??? traZODone (DESYREL) 150 MG tablet Take 150 mg by mouth nightly.     ??? XARELTO 20 mg tablet Take 20 mg by mouth daily.       No current facility-administered medications on file prior to visit.        Allergies:  Allergies as of 08/22/2021 - Reviewed 08/01/2021   Allergen Reaction Noted   ??? Aztreonam Anaphylaxis, Hives, Nausea And Vomiting, and Rash 09/06/2015   ??? [place] [aztreonam lysine] Anaphylaxis 12/27/2016   ??? Cefepime Itching, Nausea Only, and Other (See Comments) 09/06/2015   ??? Other Anaphylaxis and Other (See Comments) 09/06/2015   ??? Slo-bid 100 Anaphylaxis 06/20/2017   ??? Tobramycin Tinnitus 09/06/2015   ??? Banana Itching and Nausea And Vomiting 07/29/2016     Genetics:  [id number] and [id number] insertion  ??  Airway Clearance Regimen:  Albuterol  HTS 7% BID  Pulmozyme  Also on symbicort  Uses mainly vest  ??  Inhaled ABX:  Off TOBI due to tinnitus/hearing loss   ??  Hemoptysis:   No  ABPA:             No  Ptx:                  No  Sinusitus:  Yes - has had sinus surgery in the past, but has been awhile   ??  Panc Insuf:     Yes  PEG:                No  DIOS:              In past year, did have blockage that put in local hospital - was able to do laxatives didn't need an NG tube ([facility] in [place])  CF Liver Dz:   No - did have mild LFT bump during admission 07/2021  ??  Diabetes:        Yes - follows w/ endocrine  Osteopenia:   N/A  ??  Depression:   Yes  ??  Other Co-morbidities:  Herniated disc with lumbar radiculopathy s/p surgery  ??  Social Setting:  Lives with wife in [place]  Not currently working   Used to drink more, but has stopped drinking all together after 07/2021 hospitalization  Does use a fair amount of tyenol -   Does dip occasionally  No vaping or e-cigs    Exacerbation History:  07/2021 - hospitalization at [facility],   03/2021 - COVID, treated for CF exacerbation x 8 days and also received remdesivir, but recovered quickly so ultimately attributed more to COVID rather than CF and discharged     Micro History:  MSSA  - 06/08/21, pan S    Mucoid PsA  - 06/08/21 -     Smooth PsA  S: Cipro, Levo, Tobra  I: Aztreo, Cefe, Ceftaz, PT, LVX  ??  Stenotrophamonas  R: Ceftat    MABSC - s/p RLL resection  - Assumed S to erythro from [facility]  06/2017: MASBC not present, but M gordonae is   ????  Review of Systems:  A comprehensive review of systems was completed and negative except as noted in HPI.    PHYSICAL EXAM:     There were no vitals filed for this visit. as this was a virtual visit   There is no height or weight on file to calculate BMI.  Wt Readings from Last 3 Encounters:   07/30/21 (!) 111.1 kg (245 lb)   06/08/21 (!) 113.4 kg (250 lb)   03/14/21 (!) 111.2 kg (245 lb 1.6 oz)     GEN: Cooperative male, NAD  HEENT: NCAT, EOMI, sclerae anicteric, MMM  HEART/CV: appears well perfused  LUNGS/RESP: normal WOB on RA  ABD/GI:   EXT: No cyanosis  SKIN: No rashes or lesions on visible skin  NEURO: No focal deficits noted  PSYCH: Awake, alert, and interactive. Mood and affect appropriate.     LABORATORY and RADIOLOGY DATA:     Pulmonary Function Tests:    08/01/21          Interpretation: Spirometry demonstrates mild obstruction and is unchanged from prior test.     Pertinent Laboratory Data:  BMI:   There is no height or weight on file to calculate BMI.    Last Sputum Cx: 06/08/21 - 2+ MSSA (pan-S), <1+ Mucoid Pseudomonas (R imipenem, I cipro, levofloxacin, meropenem, S aztreonam, ceftaz, pip-tazo)  Last AFB:  06/08/21 - smear negative, culture negative     DEXA Scan:  03/04/17  Result: normal  Last OGTT:  N/a CFRD    CF Sputum Culture   Date Value Ref Range Status   06/08/2021 2+ Methicillin-Susceptible Staphylococcus aureus (A)  Final   06/08/2021 <1+ Mucoid Pseudomonas aeruginosa (A)  Final   06/08/2021 2+  Oropharyngeal Flora Isolated  Final        AFB Smear   Date Value Ref Range Status   06/08/2021  No Acid Fast Bacilli Seen Final    NO ACID FAST BACILLI SEEN- 3 negative smears do not exclude pulmonary TB. If active pulmonary TB is suspected, continue airborne isolation until pulmonary disease is excluded by negative cultures.     AFB Culture   Date Value Ref Range Status   06/08/2021 No Acid Fast Bacilli Detected  Final        IgE, Total   Date Value Ref Range Status   02/06/2021 18.8 2-214 IU/mL IU/mL Final        Total Bilirubin   Date Value Ref Range Status   08/02/2021 0.3 0.3 - 1.2 mg/dL Final     AST   Date Value Ref Range Status   08/02/2021 14 <=34 U/L Final     ALT   Date Value Ref Range Status   08/02/2021 36 10 - 49 U/L Final     Alkaline Phosphatase   Date Value Ref Range Status   08/02/2021 89 46 - 116 U/L Final        INR   Date Value Ref Range Status   07/30/2021 2.00  Final        Hemoglobin A1C   Date Value Ref Range Status   06/08/2021 9.0 (H) 4.8 - 5.6 % Final     Estimated Average Glucose   Date Value Ref Range Status 06/08/2021 212 mg/dL Final        Vitamin A   Date Value Ref Range Status   02/06/2021 58.7 32.5 - 78.0 mcg/dL Final     Comment:        -------------------ADDITIONAL INFORMATION-------------------  This test was developed and its performance characteristics   determined by [facility] in a manner consistent with CLIA   requirements. This test has not been cleared or approved by   the U.S. Food and Drug Administration.     Test Performed by:  [facility]  [address] [place], [place], [place] [address]  Lab Director: Paul Dykes M.D. Ph.D.; CLIA# [id number]     Vitamin E   Date Value Ref Range Status   02/06/2021 16.0 5.5 - 17.0 mg/L Final     Comment:        -------------------ADDITIONAL INFORMATION-------------------  This test was developed and its performance characteristics   determined by [facility] in a manner consistent with CLIA   requirements. This test has not been cleared or approved by   the U.S. Food and Drug Administration.     Test Performed by:  [facility]  [address] [place], [place], [place] [address]  Lab Director: Paul Dykes M.D. Ph.D.; CLIA# [id number]     Vitamin D Total (25OH)   Date Value Ref Range Status   02/06/2021 38.2 20.0 - 80.0 ng/mL Final        CT Chest 06/24/18  FINDINGS:   Right lower lobe and inferior lingular bronchiectasis. Right lower lobe peripheral mucoid impacted airways and tree-in-bud nodular opacities unchanged. Gas trapping present all lung lobes, but most severe in the right lower lobe.   ??  Right upper lobectomy with stable postoperative changes. No consolidation.   ??  No pleural effusion.  ??  Right-sided Port-A-Cath terminates in the right atrium. Heart size normal. No pericardial effusion. Normal caliber thoracic aorta. No mediastinal lymphadenopathy.  ??  Bilateral gynecomastia. Hepatic steatosis. Osseous structures unremarkable.  ??  IMPRESSION:  ??  Chronic small airways disease including gas trapping and peripheral mucoid impacted airways unchanged compared 12/29/2017

## 2021-08-22 ENCOUNTER — Ambulatory Visit: Admit: 2021-08-22 | Discharge: 2021-08-23 | Payer: PRIVATE HEALTH INSURANCE

## 2021-08-22 ENCOUNTER — Ambulatory Visit
Admit: 2021-08-22 | Discharge: 2021-08-23 | Payer: PRIVATE HEALTH INSURANCE | Attending: Registered" | Primary: Registered"

## 2021-08-22 ENCOUNTER — Telehealth: Admit: 2021-08-22 | Discharge: 2021-08-23 | Payer: PRIVATE HEALTH INSURANCE

## 2021-08-22 DIAGNOSIS — Z5181 Encounter for therapeutic drug level monitoring: Principal | ICD-10-CM

## 2021-08-22 DIAGNOSIS — J329 Chronic sinusitis, unspecified: Principal | ICD-10-CM

## 2021-08-22 DIAGNOSIS — E089 Diabetes mellitus due to underlying condition without complications: Principal | ICD-10-CM

## 2021-08-22 DIAGNOSIS — H9319 Tinnitus, unspecified ear: Principal | ICD-10-CM

## 2021-08-22 NOTE — Unmapped (Signed)
Addendum to Dr Marjorie Smolder note of today:    Attending addendum:    I confirm that I discussed the patient with Dr [name], and  agree with the plan for treatment and follow up as outlined.      Collene Leyden MD FCCP

## 2021-08-22 NOTE — Unmapped (Signed)
It was so nice to see you today!  Goals and plans we discussed today:  Check blood pressure a few times a week - sometimes before your lisinopril, sometimes after your blood pressure meds - let us know if running >150/90, or if having symptomatic high or low blood pressures (<100/60)  Follow up with endocrine doctors about your diabetes  ENT and audiology referral to check hearing, evaluate sinuses  Keep an eye on respiratory symptoms  Labs and DEXA scan at your next visit in about 3 months in person     Contact information:  Our clinic is located at:  [address], [address]  [place], [place] [address]    You can reach [name] at:  Front Desk/Appointments: [phone]  Fax: [phone]      Thank you for allowing [name] to be a part of your care!     To reach your CF nurse coordinators:    Patients with the last name A-K: Joni Reining [phone]  Patients with the last name L-Z: Harriett Sine [phone]     For urgent issues after hours/weekends:  Hospital Operator: [phone]) [phone], ask for Pulmonary Fellow on-call     To make or change a clinic appointment:   [facility]: [phone]     --> When you should use MyChart:           - Order a prescription refill          - View test results          - Send a non-urgent message or update to the care team          - View after-visit summaries           - See or pay bills      --> When you should call (NOT use MyChart)           - Increase in cough          - Change in amount of mucus or mucus color           - Coughing up blood or blood-tinged mucus          - Chest pain          - Shortness of breath           - Lack of energy, feeling sick, or increase in tiredness     --> I don't have a MyChart. Why should I get one?           - It's encrypted, so your information is secure          - It's a quick, easy way to contact the care team, manage appointments, see test results, and more!      --> How do I sign-up for MyChart?            - Download the MyChart app from the [employer] or [employer] and sign-up in the app           - Sign-up online at [email or web address]

## 2021-08-22 NOTE — Unmapped (Signed)
Adult Cystic Fibrosis Clinic  Yearly Assessment (Phone)    Current Inhaled Medications:     Albuterol Neb 2.5mg  BID  Albuterol HFA 61mcg/2puffs PRN  Hypertonic Saline 7% BID  Pulmozyme 2.5mg  Qday  Tobi INH 300mg  BID alternating months  Symbicort HFA BID    Home O2: None    Home Spirometer: Has a home spirometer and used it this morning. FEV1 65%. Last clinic FEV1 12/20 69.9%. He will have a video visit today with the provider.     Review order meds are taken, frequency, compliance: Normally compliant but admits to missing some administrations. No longer works so feels he can do better now.     Barriers to Compliance and Solutions:    Airway Clearance Used: The patient has a Monarch vest and an [place]. Uses the vest daily for 30 minutes.    Exercise: Walks outside often.    Airway Clearance/Not effective: n/a    Cough Techniques: Uses as needed    Spacer/MDI training: The patient has a spacer, uses it with the inhaled medications and is aware of the correct technique for use. Also reviewed cleaning of the spacer.     Bipap/Cpap;Settings if use: n/a    Equipment Review/Cleaning: The patient boils to sterilize.     DME Provider:    Misc. Notes: The patient had no further needs or concerns today. Encouraged the patient to reach out should any arise.

## 2021-08-31 NOTE — Unmapped (Unsigned)
Chief Complaint:     36 y.o. male with a PMH of CF, HTN w CFRD.   No chief complaint on file.    Subjective:     HPI   CFRD dx'd ~2/19.       Last seen by myself 03/09/2021  At last visit:   ??? Blood sugars still a little high over night might try more Basaglar 55 or 60 unis  ??? Coverage for dinner w Novolog and prebolus  ??? Urine albumin test today  ??? CDE for pump  ??? Dexcom to [facility]  Since last visit:   Interim f/u Alita Chyle, CDE--->  pump    Currently taking the following for CFRD:   Basaglar Lantus insulin 50 units qhs  Aspart insulin ~30 units ac, + 2:50>150 (taking less than before d/t weight loss)    Denies symptoms consistent with peripheral or autonomic neuropathy  Denies recent lows requiring assistance  Denies recent nocturnal lows  DFE scheduled setup [place] locally     __________________________________________________________  Since last visit:   Wants Omnipod, using Libre  Recent surgery herniated disk, had to quit job  Wife accompanies and works EMS and they have done research on that one and [employer] does diabetes program  Weight loss due to dietary changes  Stopped drinking soda and mainly water/SF juice  Had physical job Radiographer, therapeutic house    Currently taking the following for CFRD:   Hospital doctor Lantus insulin 50 units qhs  Aspart insulin ~30 units ac, + 2:50>150 (taking less than before d/t weight loss)    Denies symptoms consistent with peripheral or autonomic neuropathy  Denies recent lows requiring assistance  Denies recent nocturnal lows  DFE scheduled setup [place] locally     Continuous glucose monitor (CGM) was computer downloaded, I personally reviewed greater than 72 hours of data and interpreted, and discussed the findings with the patient.   Evening excursions        __________________________________________________  ________________________      Initial HPI carried forward.   Patient is a 36 year old male with a PMH of CF, HTN and GAD/MDD who presents for a recurrence of CFRD.     The patient was initially treated for cystic fibrosis related diabetes (CFRD) when he received his care at [facility] with 5 units of levemir at night. Subsequently, after moving to [place], he was taken off of his diabetes medications which was approximately 3 years ago. Since then he has continued to check his blood sugar approximately three times per day. Recently, his blood sugars have been typically running 200's - 300's. He was scheduled for endocrinology assessment for CFRD and prior to today's appointment, his hemoglobin A1c was found to be 7.6%. Upon arrival to the clinic today, his POCT glucose was 306 mg/dL.     He denies any regular hypglycemic episodes. If he does not eat much, he reports that he will occasionally dip lower than usual (approxiamtely once per week his blood glucose will run <100 mg/dL and approximately once per month his blood glucose will be <60 mg/dL). He states that recently, however, the lowest he has seen is 160 mg/dL. He endorses occasional blurriness in his vision and denies any neuropathic symptoms in his feet. He requires a new meter. His cystic fibrosis affects his lungs, sinuses and GI tract; he endorses pancreatic involvement. He does not appear malnourished. Most recently he was hospitalized in January 2019 for a CF-bronchiectasis exacerbation. He had a RUL lobectomy in August  2018. For his HTN, he takes lisinopril and carvedilol and he takes lamotrigine for his GAD and MDD.    The past medical history, surgical history, family history, social history, medications and allergies were reviewed in Epic.    Past Medical History:   Diagnosis Date   ??? Anxiety    ??? Chronic pain disorder    ??? Cystic fibrosis (CMS-HCC)    ??? Depression    ??? Hypertension    ??? Lumbar radiculopathy 10/26/2020   ??? Nonproductive cough 04/05/2018     Past Surgical History:   Procedure Laterality Date   ??? IR INSERT PORT AGE GREATER THAN 5 YRS  03/27/2019    IR INSERT PORT AGE GREATER THAN 5 YRS 03/27/2019 Rush Barer, MD IMG [facility]   ??? PR REMOVAL OF LUNG,LOBECTOMY Right 03/29/2017    Procedure: REMOVAL OF LUNG, OTHER THAN PNEUMONECTOMY; SINGLE LOBE (LOBECTOMY);  Surgeon: Cherie Dark, MD;  Location: MAIN OR [facility];  Service: Thoracic     Current Outpatient Medications on File Prior to Visit   Medication Sig Dispense Refill   ??? acetaminophen (TYLENOL) 500 MG tablet Take 1 tablet (500 mg total) by mouth every six (6) hours as needed. 30 tablet 0   ??? albuterol (PROVENTIL HFA;VENTOLIN HFA) 90 mcg/actuation inhaler Inhale 2 puffs every six (6) hours as needed. 1 Inhaler 1   ??? albuterol 2.5 mg /3 mL (0.083 %) nebulizer solution Inhale 3 mL (2.5 mg total) by nebulization two (2) times a day. 540 mL 3   ??? amitriptyline (ELAVIL) 100 MG tablet TAKE ONE TABLET BY MOUTH AT BEDTIME (Patient taking differently: No sig reported) 30 tablet 1   ??? [EXPIRED] amoxicillin-clavulanate (AUGMENTIN) 875-125 mg per tablet Take 1 tablet by mouth Two (2) times a day for 10 days. 20 tablet 0   ??? atorvastatin (LIPITOR) 20 MG tablet Take 20 mg by mouth nightly.     ??? blood sugar diagnostic (GLUCOSE BLOOD) Strp by Other route Three (3) times a day with a meal. Rx sent to [facility] 01/04/20     ??? blood-glucose meter kit Dispense meter that is preferred by patient's insurance company 1 each 0   ??? blood-glucose meter,continuous (DEXCOM G6 RECEIVER) Misc Use as directed 1 each 0   ??? blood-glucose sensor (DEXCOM G6 SENSOR) Devi Use sq as directed every 10 days 9 each 11   ??? blood-glucose transmitter (DEXCOM G6 TRANSMITTER) Devi Use as directed, replace every 90 days 1 each 11   ??? budesonide-formoteroL (SYMBICORT) 160-4.5 mcg/actuation inhaler Inhale 2 puffs Two (2) times a day. 30.6 g 3   ??? cetirizine (ZYRTEC) 10 MG tablet Take 1 tablet (10 mg total) by mouth daily. 30 tablet 11   ??? codeine-guaifenesin (GUAIFENESIN AC) 10-100 mg/5 mL liquid Take 5 mL by mouth Three (3) times a day as needed for cough.     ??? dornase alfa (PULMOZYME) 1 mg/mL nebulizer solution Inhale 2.5 mg daily. 450 mL 3   ??? EASY TOUCH LANCING DEVICE Misc Use as directed.     ??? EASY TOUCH TWIST LANCETS 30 gauge Misc Use as directed.     ??? elexacaftor-tezacaftor-ivacaft (TRIKAFTA) 100-50-75 mg(d) /150 mg (n) tablet Take 2 Tablets (orange) by mouth in the morning and 1 tablet (blue) in the evening with fatty food 84 tablet 6   ??? FLUoxetine 60 mg Tab Take 60 mg by mouth daily.      ??? fluticasone propionate (FLONASE) 50 mcg/actuation nasal spray 2 sprays into each  nostril daily.     ??? FREESTYLE LIBRE 14 DAY SENSOR kit Use 3-4 times daily     ??? gabapentin (NEURONTIN) 600 MG tablet Take 600 mg by mouth Three (3) times a day.     ??? insulin ASPART (NOVOLOG U-100 INSULIN ASPART) 100 unit/mL injection Subcutaneously infuse up to 150 units daily via insulin pump 150 mL 11   ??? insulin pump cart,automated,BT (OMNIPOD 5 G6 PODS, GEN 5,) Crtg 1 each by subcutaneous (via wearable injector) route every three (3) days. Change every 72 hour 10 each 12   ??? lamoTRIgine (LAMICTAL) 200 MG tablet Take 400 mg by mouth daily.     ??? lipase-protease-amylase (CREON) 36,000-114,000- 180,000 unit CpDR Take 8 caps by mouth with meals three times daily and 4 caps with snacks up to three times a day.     ??? lisinopriL (PRINIVIL,ZESTRIL) 10 MG tablet Take 1 tablet (10 mg total) by mouth daily. 30 tablet 0   ??? melatonin 10 mg Tab Take 10 mg by mouth nightly as needed.     ??? montelukast (SINGULAIR) 10 mg tablet Take 1 tablet (10 mg total) by mouth nightly. 90 tablet 3   ??? MVW COMPLETE FORMUL PROBIOTIC 40 billion cell -15 mg CpDR Take 1 capsule by mouth daily. (Patient taking differently: Take 1 capsule by mouth daily.) 90 capsule 3   ??? MVW Complete, pediatric multivit 61-D3-vit K, (MVW COMPLETE FORMULATION) 1,500-800 unit-mcg cap Take 2 capsules by mouth Two (2) times a day.     ??? nebulizers Misc Use as directed with inhaled medications 4 each 3   ??? omeprazole (PRILOSEC) 20 MG capsule Take 1 capsule (20 mg total) by mouth in the morning. 90 capsule 3   ??? [EXPIRED] ondansetron (ZOFRAN-ODT) 4 MG disintegrating tablet Take 1 tablet (4 mg total) by mouth every eight (8) hours as needed for up to 7 days. 8 tablet 0   ??? [EXPIRED] oxyCODONE (ROXICODONE) 5 MG immediate release tablet Take 1 tablet (5 mg total) by mouth every eight (8) hours as needed for up to 3 days. 6 tablet 0   ??? pen needle, diabetic (BD ULTRA-FINE NANO PEN NEEDLE) 32 gauge x 5/32 (4 mm) Ndle use up to 4 times daily 400 each 12   ??? pramipexole (MIRAPEX) 0.125 MG tablet Take 0.25 mg by mouth nightly.     ??? sodium chloride 7% 7 % Nebu Inhale 4 mL by nebulization two (2) times a day.     ??? traZODone (DESYREL) 150 MG tablet Take 150 mg by mouth nightly.     ??? XARELTO 20 mg tablet Take 20 mg by mouth daily.       No current facility-administered medications on file prior to visit.     SOCIAL HISTORY:  Social History     Social History Narrative   ??? Not on file     FAMILY HISTORY:  Family History   Problem Relation Age of Onset   ??? Bipolar disorder Mother    ??? Depression Mother      ROS:  The balance of 10 systems was reviewed and unremarkable except as stated above.     Objective:   There were no vitals taken for this visit.  ***  Alert and oriented with appropriate mood and affect, appears well and in no distress  NECK: Supple without carotid bruits, thyromegaly or lymphadenopathy.   HEART: Regular rate and rhythm without murmur, rub, gallop.  ABDOMEN: Soft and nontender without masses.   EXTREMITIES:  Showed no edema, distal pulse intact, hygiene good  NEURO: Sensation intact on the soles of the feet to the 10g monofilament on bilateral barefoot examination      REVIEW OF RECENT PERTINENT LABORATORY AND IMAGING DATA:  Lab Results   Component Value Date    A1C 9.0 (H) 06/08/2021    A1C 10.0 (H) 03/09/2021    A1C 10.7 (H) 11/29/2020    A1C 9.4 (H) 01/18/2020    A1C 8.8 (H) 09/29/2019    A1C 8.5 (H) 08/12/2018    A1C 10.7 (H) 04/06/2018    A1C 9.0 (H) 01/07/2018     Lab Results   Component Value Date    CREATININE 0.74 08/02/2021     Lab Results   Component Value Date    GFRNONAA 114 01/18/2020     Lab Results   Component Value Date    GFRAA 132 01/18/2020     Lab Results   Component Value Date    K 3.7 08/02/2021     Lab Results   Component Value Date    CHOL 148 12/05/2020     Lab Results   Component Value Date    LDL 96 12/05/2020     Lab Results   Component Value Date    HDL 36 (L) 12/05/2020     Lab Results   Component Value Date    TRIG 80 12/05/2020     Lab Results   Component Value Date    ALT 36 08/02/2021    ALT 31 01/18/2020     No results found for: LABCREA, ALBQTUR, MSHCGMOM, ALBCRERAT  No results found for: VITAMINB12  Lab Results   Component Value Date    TSH 2.120 11/18/2016     Lab Results   Component Value Date    WBC 6.0 08/02/2021    HGB 11.5 (L) 08/02/2021    HCT 34.9 (L) 08/02/2021    PLT 164 08/02/2021     Lab Results   Component Value Date    VITDTOTAL 38.2 02/06/2021     Lab Results   Component Value Date    CALCIUM 8.7 08/02/2021       Assessment/Plan:   1. 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 Encounter for long-term (current) use of insulin (CMS-HCC)  As above    3. Obesity  Noted, commended on efforts    Follow up recommended in about 4 mo w CDE interim for pump discussion, he is aware to contact us or return sooner for interim concerns.

## 2021-08-31 NOTE — Unmapped (Incomplete)
It was a pleasure seeing you today at our [facility].

## 2021-09-01 NOTE — Unmapped (Signed)
09/01/21 1:23 PM  Discussed patient call with Dr Sallyanne Kuster. She is wanting to focus right now on Christian Young's sinus complaints. She does think that it might be viral (given the GI upset/diarrhea). Advises the following:  ?? COVID testing   ?? Sinus rinses/irriatgation with saline   ?? Check in on Monday to determine if symptoms are better, if not would be agreeable to antibiotics  ?? Wanting to hold on antibiotics to see if symptoms improve with rinses and also concerned that antibiotics might aggravate or worsen GI upset     2:05 PM  Reviewed the above with Christian Young and his wife by phone. Verbalized understanding of the plan. Will connect on Monday to see how [name] is feeling.     [name], RN  CF Nurse Coordinator   [phone]

## 2021-09-01 NOTE — Unmapped (Signed)
Christian Young Dr [name] through MyChart:    So I am reaching out to you with a couple concerns I have been dealing with, I have been experiencing some pretty bad nerve pain in my right arm some days I can barely use it, I am taking Tylenol for it and it really does not help. My other concern is my ears and sinuses also been been hurting inside and outside my ears, got some dizziness as well plus diarrhea. I am at my end with this I have tried otc and nothing is getting better. Thank you for taking the time to read this       Spoke with Christian Young about his complaints:    Ears/Sinuses:  ?? Onset: Last weekend, but not enough to think it was a sinus infection became worse as the week progressed   ?? High blood pressure? Has been okay not as high as the value (140's/105) he reported during his virtual visit on 1/10   ?? Found the sinus/ear discomfort gradually coming on, beginning this week, every so often would hurt (ears and head). As the week progressed, it has been a constant thing. Last night, Christian Young reports having a cold sweat, woke up sweaty. Having a lot of pressure and ear pain.   ?? Sinus rinses: none   ?? Pressure/pain location: cheeks, jaw and above his eyes, endorsing both pressure and pain   ?? Drainage: green (not dark green) --feels like it is more congested and not dripping or easily being expelled out his nose   ?? Blowing nose causes the pressure to get worse and gets very little out   ?? Post-nasal drip: sore throat (new development) constant pain today   ?? Ear pain: blowing nose aggravates pain, today is a constant ache (inner ear)     Nerve Pain  ?? Can't recall a specific injury   ?? Nerve pain is pretty bad, notes some days his right arm is swollen, some days it is so swollen he can barely move it.   ?? When sitting on the couch, arm rest/elevated, seems to help. When moving around/not elevated the swelling can worsen   ?? Looses grip strength at times in his arm   ?? Nerve pain can be down his arm and also going down to incision from his right lobectomy, incision area is swollen as well (right flank)   ?? On Gabapentin, doesn't seem to be doing anything for the pain   ?? Remains on Xerelto   ?? Side sleeper on right side   ?? Dr. [name] noted he might need to be referred to ortho or neurology (or previous provider to completed his disc surgery) or possible neuropathy and discuss with endocrine?   ?? Physical Therapy?     Diarrhea:  ?? Enzymes--fresh and appropriately stored     ?? Foods --can't pinpoint a food cause or anyone else with GI upset   ?? Recent antibiotics--denies   ?? Lost control of bowels once last night, and also a few close calls   ?? Unable to explain diarrhea, has been taking his Creon and probiotic,   ?? A few days ago, his stools were normal-ish     General Symptoms:  ?? Night sweat last night  ?? Denies fevers, however, feeling cold a few times during the day   ?? Reports having taking Tylenol, an hour or so later, gets really hot and sweaty   ?? Denies illness in the home  ?? No one tested  for any viral infections   ?? Appetite: okay--does go until dinner time to eat   ?? Energy Level: down, taking a lot of naps (more that usual)   ?? Blood pressure: been okay--hasn't been as high as the documented values during the virtual visit     Pulmonary:  Increased cough, but nothing that alarms him, has been a dry cough     COVID testing: not done --may have a home kit, but uncertain     Preferred Pharmacy:   [facility] in [place]    Dr [name] had a recent video visit with Christian Young. Per notes:  ?? Recent elevated BP at home with a headache, will increase BP monitoring before making changes given recent hypotension and have him check if developing headache, symptoms, etc.   ?? Chronic back pain--gabapentin 600 mg TID  ?? Generally feeling OK - feeling really dizzy, and feels that stamina is still low. Feels overall unstable. Blood pressures at home have been running high, no issues with low. Highest 170/105. Has resumed his 10 mg of lisinopril daily. Hastn' typically had low BP when sick - blood cultures were negative. Eating and drinking ok since leaving the hospital.   ?? CF sinus disease   - referred to [facility] ENT last visit due to history of sinus disease (never scheduled),    - previously referred to Audiology    - will re-refer to both (assess hearing as above, sinus symptoms recently worse)       Will route to Dr [name] and covering providers, as Dr [name] is working nights in [facility].     [name], RN  CF Nurse Coordinator   [phone]

## 2021-09-04 DIAGNOSIS — J329 Chronic sinusitis, unspecified: Principal | ICD-10-CM

## 2021-09-04 MED ORDER — DOXYCYCLINE HYCLATE 100 MG CAPSULE
ORAL_CAPSULE | Freq: Two times a day (BID) | ORAL | 0 refills | 7 days | Status: CP
Start: 2021-09-04 — End: 2021-09-11

## 2021-09-04 MED ORDER — CIPROFLOXACIN 500 MG TABLET
ORAL_TABLET | Freq: Two times a day (BID) | ORAL | 0 refills | 7 days | Status: CP
Start: 2021-09-04 — End: 2021-09-11

## 2021-09-04 NOTE — Unmapped (Signed)
Feeling okay, but not great.  Home COVID test was negative.     Did some sinus irrigations (squeeze bottle/Neil-Med). Lukis noted it did bring out a little bit of sinus drainage, but made the sinus pain worse.     Kash also noted chills over the weekend and waking up in sweats (more than usual).     GI problems has gotten better, solid stools.    Requesting an antibiotic(s) to be routed to [facility] in [place], [place]    [name]. [name], RN  CF Nurse Coordinator   [phone]

## 2021-09-05 NOTE — Unmapped (Signed)
Discussed with Eula the two antibiotics prescribed x 7 days. Asked Zakiah to check in with this writer towards the end of the week of antibiotics, should his symptoms fail to improve.     [name], RN  CF Nurse Coordinator   [phone]

## 2021-09-07 DIAGNOSIS — M5416 Radiculopathy, lumbar region: Principal | ICD-10-CM

## 2021-09-07 DIAGNOSIS — E089 Diabetes mellitus due to underlying condition without complications: Principal | ICD-10-CM

## 2021-09-08 ENCOUNTER — Ambulatory Visit
Admit: 2021-09-08 | Discharge: 2021-09-12 | Disposition: A | Discharge status: Home / Self Care | Payer: PRIVATE HEALTH INSURANCE | Admitting: Internal Medicine

## 2021-09-08 LAB — CBC W/ AUTO DIFF
BASOPHILS ABSOLUTE COUNT: 0.2 10*9/L — ABNORMAL HIGH (ref 0.0–0.1)
BASOPHILS RELATIVE PERCENT: 1.6 %
EOSINOPHILS ABSOLUTE COUNT: 0.1 10*9/L (ref 0.0–0.5)
EOSINOPHILS RELATIVE PERCENT: 1.4 %
HEMATOCRIT: 41.5 % (ref 39.0–48.0)
HEMOGLOBIN: 14 g/dL (ref 12.9–16.5)
LYMPHOCYTES ABSOLUTE COUNT: 2 10*9/L (ref 1.1–3.6)
LYMPHOCYTES RELATIVE PERCENT: 20.7 %
MEAN CORPUSCULAR HEMOGLOBIN CONC: 33.8 g/dL (ref 32.0–36.0)
MEAN CORPUSCULAR HEMOGLOBIN: 26.6 pg (ref 25.9–32.4)
MEAN CORPUSCULAR VOLUME: 78.9 fL (ref 77.6–95.7)
MEAN PLATELET VOLUME: 8.6 fL (ref 6.8–10.7)
MONOCYTES ABSOLUTE COUNT: 0.6 10*9/L (ref 0.3–0.8)
MONOCYTES RELATIVE PERCENT: 6.6 %
NEUTROPHILS ABSOLUTE COUNT: 6.6 10*9/L (ref 1.8–7.8)
NEUTROPHILS RELATIVE PERCENT: 69.7 %
NUCLEATED RED BLOOD CELLS: 0 /100{WBCs} (ref <=4)
PLATELET COUNT: 281 10*9/L (ref 150–450)
RED BLOOD CELL COUNT: 5.26 10*12/L (ref 4.26–5.60)
RED CELL DISTRIBUTION WIDTH: 16.4 % — ABNORMAL HIGH (ref 12.2–15.2)
WBC ADJUSTED: 9.4 10*9/L (ref 3.6–11.2)

## 2021-09-08 LAB — COMPREHENSIVE METABOLIC PANEL
ALBUMIN: 4.4 g/dL (ref 3.4–5.0)
ALKALINE PHOSPHATASE: 97 U/L (ref 46–116)
ALT (SGPT): 37 U/L (ref 10–49)
ANION GAP: 9 mmol/L (ref 5–14)
AST (SGOT): 22 U/L (ref <=34)
BILIRUBIN TOTAL: 0.4 mg/dL (ref 0.3–1.2)
BLOOD UREA NITROGEN: 11 mg/dL (ref 9–23)
BUN / CREAT RATIO: 15
CALCIUM: 9.8 mg/dL (ref 8.7–10.4)
CHLORIDE: 101 mmol/L (ref 98–107)
CO2: 24.4 mmol/L (ref 20.0–31.0)
CREATININE: 0.73 mg/dL
EGFR CKD-EPI (2021) MALE: >90 mL/min/{1.73_m2} (ref >=60)
GLUCOSE RANDOM: 287 mg/dL — ABNORMAL HIGH (ref 70–179)
POTASSIUM: 4.4 mmol/L (ref 3.4–4.8)
PROTEIN TOTAL: 7.7 g/dL (ref 5.7–8.2)
SODIUM: 134 mmol/L — ABNORMAL LOW (ref 135–145)

## 2021-09-08 LAB — HIGH SENSITIVITY TROPONIN I - SINGLE: HIGH SENSITIVITY TROPONIN I: <3 ng/L (ref <=53)

## 2021-09-08 MED ADMIN — albuterol 2.5 mg /3 mL (0.083 %) nebulizer solution 5 mg: 5 mg | RESPIRATORY_TRACT | @ 23:00:00 | Stop: 2021-09-08

## 2021-09-08 MED ADMIN — MORPhine 4 mg/mL injection 4 mg: 4 mg | INTRAVENOUS | @ 23:00:00 | Stop: 2021-09-08

## 2021-09-08 MED ADMIN — ipratropium (ATROVENT) 0.02 % nebulizer solution 500 mcg: 500 ug | RESPIRATORY_TRACT | @ 23:00:00 | Stop: 2021-09-08

## 2021-09-08 MED ADMIN — piperacillin-tazobactam (ZOSYN) 3.375 g in sodium chloride 0.9 % (NS) 100 mL IVPB-connector bag: 3.375 g | INTRAVENOUS | @ 23:00:00 | Stop: 2021-09-08

## 2021-09-08 NOTE — Unmapped (Signed)
[name] this nurse coordinator through MyChart:     Vida Roller I am reaching out to you this afternoon because last night I coughed up blood and passed out, [name] came with her truck and did a 12 lead on me and gave me a breathing treatment that got me through the night but I am headed to [place] this afternoon     Spoke with Gunner and his wife, [name].  Racer states that he was fine for most of the afternoon yesterday. His wife went to work. Lawrnce reports having a coughing fit, and coughed up blood. Artavius notes is was bright red blood mixed with a little sputum. Coughed and spit into the sink, unable to gauge volume. This happened once and then he passed out. Thankfully he was on the phone with his wife reporting the blood when he passed out. His wife's sister who lives next door found him passed out on the floor.  When his wife [name] arrived, she found to him be tachypnic with shallow breathes, thinks passed out due to his breathing.  She did complete a twelve lead EKG, thought it might have demonstrated hyperkalemia.  BP and HR was okay, afebrile. Noted his blood sugar was high, 356. Jonuel does use an insulin pump.     [name] also administered two albuterol treatments, a duoneb and Hakop's hypertonic saline neb.  She noted his breathing improved, she went back to work and [facility] sister-in-law stayed the night.    Today she feels that Kayhan's breathing is back to normal, however, he feels a lot of pressure in chest and is still feeling crummy. They are packing and planning on coming to [facility] for assessment and possible admission. They will likely arrive between 2-4 PM today, Friday, January 27th.     Asked Django to complete his home spirometry if it is available to him. Will update Epic's primary pulmonologist and primary [facility] pulmonologist.     [name]. [name], RN  CF Nurse Coordinator   [phone]

## 2021-09-09 LAB — BASIC METABOLIC PANEL
ANION GAP: 7 mmol/L (ref 5–14)
BLOOD UREA NITROGEN: 13 mg/dL (ref 9–23)
BUN / CREAT RATIO: 18
CALCIUM: 8.9 mg/dL (ref 8.7–10.4)
CHLORIDE: 97 mmol/L — ABNORMAL LOW (ref 98–107)
CO2: 29 mmol/L (ref 20.0–31.0)
CREATININE: 0.74 mg/dL
EGFR CKD-EPI (2021) MALE: >90 mL/min/{1.73_m2} (ref >=60)
GLUCOSE RANDOM: 226 mg/dL — ABNORMAL HIGH (ref 70–179)
POTASSIUM: 4.5 mmol/L (ref 3.4–4.8)
SODIUM: 133 mmol/L — ABNORMAL LOW (ref 135–145)

## 2021-09-09 LAB — CBC
HEMATOCRIT: 37.2 % — ABNORMAL LOW (ref 39.0–48.0)
HEMOGLOBIN: 12.4 g/dL — ABNORMAL LOW (ref 12.9–16.5)
MEAN CORPUSCULAR HEMOGLOBIN CONC: 33.2 g/dL (ref 32.0–36.0)
MEAN CORPUSCULAR HEMOGLOBIN: 26.8 pg (ref 25.9–32.4)
MEAN CORPUSCULAR VOLUME: 81 fL (ref 77.6–95.7)
MEAN PLATELET VOLUME: 8.4 fL (ref 6.8–10.7)
PLATELET COUNT: 209 10*9/L (ref 150–450)
RED BLOOD CELL COUNT: 4.6 10*12/L (ref 4.26–5.60)
RED CELL DISTRIBUTION WIDTH: 16 % — ABNORMAL HIGH (ref 12.2–15.2)
WBC ADJUSTED: 7 10*9/L (ref 3.6–11.2)

## 2021-09-09 MED ADMIN — fluticasone furoate-vilanteroL (BREO ELLIPTA) 200-25 mcg/dose inhaler 1 puff: 1 | RESPIRATORY_TRACT | @ 16:00:00

## 2021-09-09 MED ADMIN — sodium chloride 7% nebulizer solution 4 mL: 4 mL | RESPIRATORY_TRACT | @ 16:00:00

## 2021-09-09 MED ADMIN — pediatric multivitamin-vit D3 1,500 unit-vit K 800 mcg (MVW COMPLETE FORMULATION) capsule: 2 | ORAL | @ 15:00:00

## 2021-09-09 MED ADMIN — lipase-protease-amylase (CREON) 36,000-114,000-180,000 unit capsule *patient supplied*: 8 | ORAL | @ 17:00:00

## 2021-09-09 MED ADMIN — lipase-protease-amylase (CREON) 36,000-114,000-180,000 unit capsule *patient supplied*: 8 | ORAL

## 2021-09-09 MED ADMIN — oxyCODONE (ROXICODONE) immediate release tablet 5 mg: 5 mg | ORAL | @ 06:00:00 | Stop: 2021-09-11

## 2021-09-09 MED ADMIN — rivaroxaban (XARELTO) tablet 20 mg: 20 mg | ORAL | @ 15:00:00

## 2021-09-09 MED ADMIN — amitriptyline (ELAVIL) tablet 100 mg: 100 mg | ORAL | @ 08:00:00

## 2021-09-09 MED ADMIN — gabapentin (NEURONTIN) capsule 600 mg: 600 mg | ORAL | @ 15:00:00

## 2021-09-09 MED ADMIN — albuterol 2.5 mg /3 mL (0.083 %) nebulizer solution 2.5 mg: 2.5 mg | RESPIRATORY_TRACT | @ 16:00:00

## 2021-09-09 MED ADMIN — iohexoL (OMNIPAQUE) 350 mg iodine/mL solution 75 mL: 75 mL | INTRAVENOUS | Stop: 2021-09-08

## 2021-09-09 MED ADMIN — piperacillin-tazobactam (ZOSYN) IVPB (premix) 4.5 g: 4.5 g | INTRAVENOUS | @ 05:00:00 | Stop: 2021-09-12

## 2021-09-09 MED ADMIN — piperacillin-tazobactam (ZOSYN) IVPB (premix) 4.5 g: 4.5 g | INTRAVENOUS | Stop: 2021-09-12

## 2021-09-09 MED ADMIN — tobramycin (NEBCIN) 280 mg in sodium chloride (NS) 0.9 % 100 mL IVPB: 280 mg | INTRAVENOUS | @ 15:00:00 | Stop: 2021-09-17

## 2021-09-09 MED ADMIN — elexacaftor-tezacaftor-ivacaft (TRIKAFTA) *patient supplied*: 2 | ORAL | @ 17:00:00

## 2021-09-09 MED ADMIN — piperacillin-tazobactam (ZOSYN) IVPB (premix) 4.5 g: 4.5 g | INTRAVENOUS | @ 11:00:00 | Stop: 2021-09-12

## 2021-09-09 MED ADMIN — pantoprazole (PROTONIX) EC tablet 20 mg: 20 mg | ORAL | @ 15:00:00

## 2021-09-09 MED ADMIN — fluticasone propionate (FLONASE) 50 mcg/actuation nasal spray 2 spray: 2 | NASAL | @ 15:00:00

## 2021-09-09 MED ADMIN — MORPhine 4 mg/mL injection 4 mg: 4 mg | INTRAVENOUS | @ 01:00:00 | Stop: 2021-09-08

## 2021-09-09 MED ADMIN — traZODone (DESYREL) tablet 150 mg: 150 mg | ORAL | @ 06:00:00

## 2021-09-09 MED ADMIN — FLUoxetine (PROzac) capsule 60 mg: 60 mg | ORAL | @ 15:00:00

## 2021-09-09 MED ADMIN — cetirizine (ZyrTEC) tablet 10 mg: 10 mg | ORAL | @ 15:00:00

## 2021-09-09 MED ADMIN — pramipexole (MIRAPEX) tablet 0.25 mg: .25 mg | ORAL | @ 08:00:00

## 2021-09-09 MED ADMIN — gabapentin (NEURONTIN) capsule 600 mg: 600 mg | ORAL | @ 21:00:00

## 2021-09-09 MED ADMIN — dornase alfa (PULMOZYME) 1 mg/mL solution 2.5 mg: 2.5 mg | RESPIRATORY_TRACT | @ 16:00:00

## 2021-09-09 MED ADMIN — lamoTRIgine (LaMICtal) tablet 400 mg: 400 mg | ORAL | @ 15:00:00

## 2021-09-09 MED ADMIN — atorvastatin (LIPITOR) tablet 40 mg: 40 mg | ORAL | @ 06:00:00

## 2021-09-09 MED ADMIN — piperacillin-tazobactam (ZOSYN) IVPB (premix) 4.5 g: 4.5 g | INTRAVENOUS | @ 17:00:00 | Stop: 2021-09-12

## 2021-09-09 MED ADMIN — montelukast (SINGULAIR) tablet 10 mg: 10 mg | ORAL | @ 06:00:00

## 2021-09-09 NOTE — Unmapped (Signed)
Care Management  Initial Transition Planning Assessment    Per EMR, pt is a 36 y.o. male with PMHx as noted below who presents to [facility] with Cystic fibrosis exacerbation               General  Care Manager assessed the patient by : Telephone conversation with patient, Medical record review, Discussion with Clinical Care team  Orientation Level: Oriented X4  Functional level prior to admission: Independent  Reason for referral: Discharge Planning, Home Health   Type of Residence: Mailing Address:  [address]  [place] [address]  Contacts: Accompanied by: Alone  Patient Phone Number: [phone] (cell)           Medical Provider(s): [facility]  Reason for Admission: Admitting Diagnosis:  Cystic fibrosis with pulmonary exacerbation (CMS-HCC) [E84.0]  Past Medical History:   has a past medical history of Anxiety, Chronic pain disorder, Cystic fibrosis (CMS-HCC), Depression, Hypertension, Lumbar radiculopathy (10/26/2020), and Nonproductive cough (04/05/2018).  Past Surgical History:   has a past surgical history that includes pr removal of lung,lobectomy (Right, 03/29/2017) and IR Insert Port Age Greater Than 5 Years (03/27/2019).   Previous admit date: 07/29/2021    Primary Insurance- Payor: Medical sales representative / Plan: [employer] CT GEN CHOICE FUND OA PLUS / Product Type: *No Product type* /   Secondary Insurance - None  Prescription Coverage -   Preferred Pharmacy - [facility] - [place], [place] - [address]  [facility]  [facility] - [place], [place] - [address]  [facility] ([facility]DR[facility]) - [place], [place] - [address], [place]    Transportation home: Private vehicle    Contact/Decision Maker  Extended Emergency Contact Information  Primary Emergency Contact: Nowack,Alyson  Address: [address]           [place], [place] [address] [place] [employer] Phone: [phone]  Relation: Spouse  Preferred language: ENGLISH  Interpreter needed? No    Legal Next of Kin / Guardian / POA / Advance Directives     HCDM (patient stated preference): Schiavi,Alyson - Spouse - [phone]    Advance Directive (Medical Treatment)  Does patient have an advance directive covering medical treatment?: Patient has advance directive covering medical treatment, copy not in chart.  Advance directive covering medical treatment not in Chart:: Copy requested from other (Comment) (from pt)    Health Care Decision Maker [HCDM] (Medical & Mental Health Treatment)  Healthcare Decision Maker: HCDM documented in the HCDM/Contact Info section.  Information offered on HCDM, Medical & Mental Health advance directives:: Patient declined information.    Advance Directive (Mental Health Treatment)  Does patient have an advance directive covering mental health treatment?: Patient has advance directive covering mental health treatment, copy not in chart.  Advance directive covering mental health treatment not in Chart:: Copy requested from other (Comment). (from pt)    Readmission Information    Have you been hospitalized in the last 30 days?: No                                Did the following happen with your discharge?                           Patient Information  Lives with: Spouse/significant other  Type  of Residence: Private residence   Location/Detail: [place]  Support Systems/Concerns: Spouse  Responsibilities/Dependents at home?: No  Home Care services in place prior to admission?: No   Outpatient/Community Resources in place prior to admission: Clinic  Agency detail (Name/Phone #): Satira Sark, MD Pulmonologist  Equipment Currently Used at Home: other (see comments) (nebulizer and chest vest)   Currently receiving outpatient dialysis?: No       Financial Information       Need for financial assistance?: No       Social Determinants of Health  Social Determinants of Health     Food Insecurity: No Food Insecurity   ??? Worried About Programme researcher, broadcasting/film/video in the Last Year: Never true   ??? Ran Out of Food in the Last Year: Never true   Tobacco Use: Medium Risk   ??? Smoking Tobacco Use: Former   ??? Smokeless Tobacco Use: Never   ??? Passive Exposure: Not on file   Transportation Needs: No Transportation Needs   ??? Lack of Transportation (Medical): No   ??? Lack of Transportation (Non-Medical): No   Alcohol Use: Not on file   Housing/Utilities: Low Risk    ??? Within the past 12 months, have you ever stayed: outside, in a car, in a tent, in an overnight shelter, or temporarily in someone else's home (i.e. couch-surfing)?: No   ??? Are you worried about losing your housing?: No   ??? Within the past 12 months, have you been unable to get utilities (heat, electricity) when it was really needed?: No   Substance Use: Not on file   Financial Resource Strain: Low Risk    ??? Difficulty of Paying Living Expenses: Not hard at all   Physical Activity: Not on file   Health Literacy: Low Risk    ??? : Never   Stress: Not on file   Intimate Partner Violence: Not on file   Depression: Not on file   Social Connections: Not on file       Complex Discharge Information    Is patient identified as a difficult/complex discharge?: No  Interventions:     Discharge Needs Assessment  Concerns to be Addressed: discharge planning, adjustment to diagnosis/illness    Clinical Risk Factors: Multiple Diagnoses (Chronic), New Diagnosis  Barriers to taking medications: No  Prior overnight hospital stay or ED visit in last 90 days: Yes  Anticipated Changes Related to Illness: none  Equipment Needed After Discharge: none  Discharge Facility/Level of Care Needs: other (see comments) (Home)    Readmission  Risk of Unplanned Readmission Score: UNPLANNED READMISSION SCORE: 28.98%  Predictive Model Details          29% (High)  Factor Value    Calculated 09/09/2021 08:04 36% Number of active Rx orders 69    [place] Risk of Unplanned Readmission Model 17% Number of ED visits in last six months 4     15% Number of hospitalizations in last year 4     7% ECG/EKG order present in last 6 months     6% Encounter of ten days or longer in last year present     5% Imaging order present in last 6 months     5% Latest hemoglobin low (12.4 g/dL)     3% Active anticoagulant Rx order present     2% Age 67     2% Charlson Comorbidity Index 2     2% Future appointment scheduled     1% Active ulcer  medication Rx order present     0% Current length of stay 0.348 days      Readmitted Within the Last 30 Days? (No if blank)   Patient at risk for readmission?: Yes    Discharge Plan  Screen findings are: Discharge planning needs identified or anticipated (Comment). (Possibly will need HI and HH)    Expected Discharge Date: TBD    Expected Transfer from Critical Care:  NA    Quality data for continuing care services shared with patient and/or representative?: Yes  Patient and/or family were provided with choice of facilities / services that are available and appropriate to meet post hospital care needs?: Yes   List choices in order highest to lowest preferred, if applicable. : [facility] per pt    Initial Assessment complete?: Yes

## 2021-09-09 NOTE — Unmapped (Signed)
Adult Inpatient CF Initial Assessment        Christian Young is a 36 y.o. male who was admitted for Cystic fibrosis with pulmonary exacerbation (CMS-HCC) [E84.0].    SpO2 97% on Room Air  Heart Rate: 87  Respiratory Rate: 18  Bilateral Breath Sounds: Diminished/clear     Cough/Sputum: How Much? NPC                              Color:none    Are they coughing up blood?                                                     No, noted last night, small amount.                                                           Home medication & frequency :    Albuterol Neb 2.5mg  BID  Hypertonic Saline 7% BID  Pulmozyme 2.5mg  BID      Home airway clearance & frequency:    Vest     Times Chosen for Inhaled Medications and Airway Clearance: 10 AM      Methods tried & found to be ineffective:    VIbra Lung    Home O2?                        No  If yes, what is their home requirement:     Home non-invasive ventilation?                                                           No  If yes, enter type of device, settings, & mode:    Airway clearance technique method offered & decided upon:  [place], Home Vest    Comments:

## 2021-09-09 NOTE — Unmapped (Signed)
Patient arrived on the floor alert and oriented. Ambulatory and independent. Isolation precautions initiated. No acute changes on this shift. All safety and fall precautions in place.     Problem: Adult Inpatient Plan of Care  Goal: Plan of Care Review  Outcome: Progressing  Goal: Patient-Specific Goal (Individualized)  Outcome: Progressing  Goal: Absence of Hospital-Acquired Illness or Injury  Outcome: Progressing  Goal: Optimal Comfort and Wellbeing  Outcome: Progressing  Goal: Readiness for Transition of Care  Outcome: Progressing  Goal: Rounds/Family Conference  Outcome: Progressing     Problem: Infection  Goal: Absence of Infection Signs and Symptoms  Outcome: Progressing     Problem: Fall Injury Risk  Goal: Absence of Fall and Fall-Related Injury  Outcome: Progressing     Problem: Infection (Pneumonia)  Goal: Resolution of Infection Signs and Symptoms  Outcome: Progressing     Problem: Respiratory Compromise (Pneumonia)  Goal: Effective Oxygenation and Ventilation  Outcome: Progressing

## 2021-09-09 NOTE — Unmapped (Signed)
[facility]  Emergency Department Provider Note      ED Clinical Impression      Final diagnoses:   Cystic fibrosis with pulmonary exacerbation (CMS-HCC) (Primary)            Impression, Medical Decision Making, Progress Notes and Critical Care      Impression, Differential Diagnosis and Plan of Care    Patient is a 36 y.o. male with PMH of cystic fibrosis, anxiety/depression, HTN, DVT (on Xarelto), and CF associated diabetes presenting for 1 day of a worsening productive cough with one episode of hemoptysis last night. He additionally endorses worsening associated shortness of breath and diffuse chest pain..    On exam, the patient appears nontoxic and in no acute distress. VS are remarkable for tachycardia to 115 but are otherwise WNL. Exam remarkable for diminished breath sounds throughout all lung fields.       Differential includes CF exacerbation vs pneumonia versus COVID, also considered PE in the setting of previous DVT with hemoptysis.    Basic labs ordered in triage remarkable for hyperglycemia to 287. hsTroponin negative. Chest x-ray with elevated right hemidiaphragm, unchanged. No consolidation. Will page Pulmonology for further recommendations. Will give duo-nebs and Morphine for symptom management.      Discussion of Management with other Physicians, QHP or Appropriate Source: Spoke with pulmonology as described below.  I have discussed the case with pharmacy relating patient's antibiotic dosing.  Independent Interpretation of Studies: Chest x-ray with elevated right hemidiaphragm, unchanged. No consolidation.  I have independently reviewed CT scan that shows no evidence of PE.  External Records Reviewed: Patient's most recent discharge summary and Patient's most recent outpatient clinic note  Escalation of Care, Consideration of Admission/Observation/Transfer: Plan for admission at this time given presumed failure of outpatient therapy.      Additional Progress Notes    6:08 PM: Spoke with Pulmonology who are concerned the patient recently ran out of his Xarelto. Will order CTA chest to rule out PE.      8:01 PM: CTA chest with no pulmonary embolism. Scattered groundglass opacities in the right lower lobe, which may represent infection/aspiration. No evidence of arterial extravasation from the bronchial arteries. Will page [facility] for admission at this time. Will start Zosyn and tobramycin, discussed with pharmacy.     8:24 PM: Spoke with [facility] who accepted the patient for admission.   ??    Portions of this record have been created using Scientist, clinical (histocompatibility and immunogenetics). Dictation errors have been sought, but may not have been identified and corrected.    See chart and resident provider documentation for details.    ____________________________________________         History        Reason for Visit  Chest Pain      HPI   Christian Young is a 36 y.o. male with PMH of cystic fibrosis, anxiety/depression, HTN, DVT (on Xarelto), and CF associated diabetes presenting for evaluation of shortness of breath. Patient reports 1 day of a worsening productive cough with one episode of hemoptysis last night.  Patient describes coughing up approximately a quarter sized blood clot.  Has not had any additional mopped assist since that time.  He additionally endorses worsening associated shortness of breath and diffuse chest pain. He completed 2 albuterol treatments and duonebs last night that provided some relief of his symptoms. He notes history of similar symptoms associated with CF exacerbations in the past that required admission for IV antibiotics. Of  note, he is currently on a course of Cipro and Doxycycline for a lung infection but does not feel that this is improving his symptoms. He has been taking these antibiotics for the past 4 days with no recent missed doses. He endorses history of DVT and is currently anticoagulated on Xarelto. No recent missed doses of Xarelto. Patient denies fever, chills, nausea, vomiting, or diarrhea.         Past Medical History:   Diagnosis Date   ??? Anxiety    ??? Chronic pain disorder    ??? Cystic fibrosis (CMS-HCC)    ??? Depression    ??? Hypertension    ??? Lumbar radiculopathy 10/26/2020   ??? Nonproductive cough 04/05/2018       Patient Active Problem List   Diagnosis   ??? Essential hypertension   ??? Depressive disorder   ??? Mood disorder (CMS-HCC)   ??? Anxiety   ??? 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 Chronic pansinusitis   ??? Pancreatic insufficiency due to cystic fibrosis (CMS-HCC)   ??? History of Mycobacterium abscessus infection   ??? Bronchiectasis (CMS-HCC)   ??? Chronic deep vein thrombosis (DVT) of lower extremity (CMS-HCC)   ??? Cystic fibrosis with pulmonary exacerbation (CMS-HCC)   ??? Cystic fibrosis exacerbation (CMS-HCC)   ??? Obesity (BMI 30-39.9)   ??? Restless leg syndrome   ??? Degeneration of lumbar intervertebral disc   ??? Lumbar radiculopathy   ??? Idiopathic peripheral neuropathy   ??? Long term current use of anticoagulant therapy   ??? Acute non-recurrent pansinusitis   ??? AKI (acute kidney injury) (CMS-HCC)   ??? Sepsis (CMS-HCC)       Past Surgical History:   Procedure Laterality Date   ??? IR INSERT PORT AGE GREATER THAN 5 YRS  03/27/2019    IR INSERT PORT AGE GREATER THAN 5 YRS 03/27/2019 Rush Barer, MD IMG [facility]   ??? PR REMOVAL OF LUNG,LOBECTOMY Right 03/29/2017    Procedure: REMOVAL OF LUNG, OTHER THAN PNEUMONECTOMY; SINGLE LOBE (LOBECTOMY);  Surgeon: Cherie Dark, MD;  Location: MAIN OR [facility];  Service: Thoracic       No current facility-administered medications for this encounter.    Current Outpatient Medications:   ???  acetaminophen (TYLENOL) 500 MG tablet, Take 1 tablet (500 mg total) by mouth every six (6) hours as needed., Disp: 30 tablet, Rfl: 0  ???  albuterol (PROVENTIL HFA;VENTOLIN HFA) 90 mcg/actuation inhaler, Inhale 2 puffs every six (6) hours as needed., Disp: 1 Inhaler, Rfl: 1  ???  albuterol 2.5 mg /3 mL (0.083 %) nebulizer solution, Inhale 3 mL (2.5 mg total) by nebulization two (2) times a day., Disp: 540 mL, Rfl: 3  ???  amitriptyline (ELAVIL) 100 MG tablet, TAKE ONE TABLET BY MOUTH AT BEDTIME (Patient taking differently: No sig reported), Disp: 30 tablet, Rfl: 1  ???  atorvastatin (LIPITOR) 20 MG tablet, Take 20 mg by mouth nightly., Disp: , Rfl:   ???  blood sugar diagnostic (GLUCOSE BLOOD) Strp, by Other route Three (3) times a day with a meal. Rx sent to [facility] 01/04/20, Disp: , Rfl:   ???  blood-glucose meter kit, Dispense meter that is preferred by [employer], Disp: 1 each, Rfl: 0  ???  blood-glucose meter,continuous (DEXCOM G6 RECEIVER) Misc, Use as directed, Disp: 1 each, Rfl: 0  ???  blood-glucose sensor (DEXCOM G6 SENSOR) Devi, Use sq as directed every 10 days, Disp: 9 each, Rfl: 11  ???  blood-glucose  transmitter (DEXCOM G6 TRANSMITTER) Devi, Use as directed, replace every 90 days, Disp: 1 each, Rfl: 11  ???  budesonide-formoteroL (SYMBICORT) 160-4.5 mcg/actuation inhaler, Inhale 2 puffs Two (2) times a day., Disp: 30.6 g, Rfl: 3  ???  cetirizine (ZYRTEC) 10 MG tablet, Take 1 tablet (10 mg total) by mouth daily., Disp: 30 tablet, Rfl: 11  ???  ciprofloxacin HCl (CIPRO) 500 MG tablet, Take 1.5 tablets (750 mg total) by mouth Two (2) times a day for 7 days., Disp: 21 tablet, Rfl: 0  ???  codeine-guaifenesin (GUAIFENESIN AC) 10-100 mg/5 mL liquid, Take 5 mL by mouth Three (3) times a day as needed for cough., Disp: , Rfl:   ???  dornase alfa (PULMOZYME) 1 mg/mL nebulizer solution, Inhale 2.5 mg daily., Disp: 450 mL, Rfl: 3  ???  doxycycline (VIBRAMYCIN) 100 MG capsule, Take 1 capsule (100 mg total) by mouth Two (2) times a day for 7 days., Disp: 14 capsule, Rfl: 0  ???  EASY TOUCH LANCING DEVICE Misc, Use as directed., Disp: , Rfl:   ???  EASY TOUCH TWIST LANCETS 30 gauge Misc, Use as directed., Disp: , Rfl:   ???  elexacaftor-tezacaftor-ivacaft (TRIKAFTA) 100-50-75 mg(d) /150 mg (n) tablet, Take 2 Tablets (orange) by mouth in the morning and 1 tablet (blue) in the evening with fatty food, Disp: 84 tablet, Rfl: 6  ???  FLUoxetine 60 mg Tab, Take 60 mg by mouth daily. , Disp: , Rfl:   ???  fluticasone propionate (FLONASE) 50 mcg/actuation nasal spray, 2 sprays into each nostril daily., Disp: , Rfl:   ???  FREESTYLE LIBRE 14 DAY SENSOR kit, Use 3-4 times daily, Disp: , Rfl:   ???  gabapentin (NEURONTIN) 600 MG tablet, Take 600 mg by mouth Three (3) times a day., Disp: , Rfl:   ???  insulin ASPART (NOVOLOG U-100 INSULIN ASPART) 100 unit/mL injection, Subcutaneously infuse up to 150 units daily via insulin pump, Disp: 150 mL, Rfl: 11  ???  insulin pump cart,automated,BT (OMNIPOD 5 G6 PODS, GEN 5,) Crtg, 1 each by subcutaneous (via wearable injector) route every three (3) days. Change every 72 hour, Disp: 10 each, Rfl: 12  ???  lamoTRIgine (LAMICTAL) 200 MG tablet, Take 400 mg by mouth daily., Disp: , Rfl:   ???  lipase-protease-amylase (CREON) 36,000-114,000- 180,000 unit CpDR, Take 8 caps by mouth with meals three times daily and 4 caps with snacks up to three times a day., Disp: , Rfl:   ???  lisinopriL (PRINIVIL,ZESTRIL) 10 MG tablet, Take 1 tablet (10 mg total) by mouth daily., Disp: 30 tablet, Rfl: 0  ???  melatonin 10 mg Tab, Take 10 mg by mouth nightly as needed., Disp: , Rfl:   ???  montelukast (SINGULAIR) 10 mg tablet, Take 1 tablet (10 mg total) by mouth nightly., Disp: 90 tablet, Rfl: 3  ???  MVW COMPLETE FORMUL PROBIOTIC 40 billion cell -15 mg CpDR, Take 1 capsule by mouth daily. (Patient taking differently: Take 1 capsule by mouth daily.), Disp: 90 capsule, Rfl: 3  ???  MVW Complete, pediatric multivit 61-D3-vit K, (MVW COMPLETE FORMULATION) 1,500-800 unit-mcg cap, Take 2 capsules by mouth Two (2) times a day., Disp: , Rfl:   ???  nebulizers Misc, Use as directed with inhaled medications, Disp: 4 each, Rfl: 3  ???  omeprazole (PRILOSEC) 20 MG capsule, Take 1 capsule (20 mg total) by mouth in the morning., Disp: 90 capsule, Rfl: 3  ???  pen needle, diabetic (BD ULTRA-FINE NANO PEN NEEDLE) 32  gauge x 5/32 (4 mm) Ndle, use up to 4 times daily, Disp: 400 each, Rfl: 12  ???  pramipexole (MIRAPEX) 0.125 MG tablet, Take 0.25 mg by mouth nightly., Disp: , Rfl:   ???  sodium chloride 7% 7 % Nebu, Inhale 4 mL by nebulization two (2) times a day., Disp: , Rfl:   ???  traZODone (DESYREL) 150 MG tablet, Take 150 mg by mouth nightly., Disp: , Rfl:   ???  XARELTO 20 mg tablet, Take 20 mg by mouth daily., Disp: , Rfl:     Allergies  Aztreonam, [place] [aztreonam lysine], Cefepime, Other, Slo-bid 100, Tobramycin, and Banana    Family History   Problem Relation Age of Onset   ??? Bipolar disorder Mother    ??? Depression Mother        Social History  Social History     Tobacco Use   ??? Smoking status: Former     Types: e-Cigarettes   ??? Smokeless tobacco: Never   Vaping Use   ??? Vaping Use: Former   Substance Use Topics   ??? Alcohol use: Not Currently     Alcohol/week: 3.0 standard drinks     Types: 3 Glasses of wine per week   ??? Drug use: No          Physical Exam     This provider entered the patient's room: Yes:    ??? If this provider did not enter the room, a comprehensive physical exam was not able to be performed due to increased infection risk to themselves, other providers, staff and other patients), as well as to conserve personal protective equipment (PPE) utilization during the COVID-19 pandemic.    ??? If this provider did enter the patient room, the following was PPE worn: Surgical mask, eye protection and gloves     ED Triage Vitals   Enc Vitals Group      BP 09/08/21 1632 138/83      Heart Rate 09/08/21 1630 115      SpO2 Pulse --       Resp 09/08/21 1630 20      Temp 09/08/21 1630 37.1 ??C (98.8 ??F)      Temp Source 09/08/21 1630 Oral      SpO2 09/08/21 1630 98 %      Weight 09/08/21 1630 (!) 111.1 kg (245 lb)      Height 09/08/21 1630 1.829 m (6')       Constitutional: Alert and oriented. Well appearing and in no distress.  Eyes: Conjunctivae are normal.  ENT       Head: Normocephalic and atraumatic. Nose: No congestion.       Mouth/Throat: Mucous membranes are moist.       Neck: No stridor.  Hematological/Lymphatic/Immunilogical: No cervical lymphadenopathy.  Cardiovascular: Normal rate, regular rhythm. Normal and symmetric distal pulses are present in all extremities.  Respiratory: Diminished breath sounds throughout all lung fields. Normal respiratory effort.  Gastrointestinal: Soft and nontender. There is no CVA tenderness.  Musculoskeletal: Normal range of motion in all extremities.       Right lower leg: No tenderness or edema.       Left lower leg: No tenderness or edema.  Neurologic: Normal speech and language. No gross focal neurologic deficits are appreciated.  Skin: Skin is warm, dry and intact. No rash noted.  Psychiatric: Mood and affect are normal. Speech and behavior are normal.       Radiology     XR Chest 2 views  Preliminary Result      Elevated right hemidiaphragm, unchanged. No consolidation.              Documentation assistance was provided by Winferd Humphrey, Scribe, on September 08, 2021 at 5:36 PM for  Shaune Leeks, MD.    September 08, 2021 11:54 PM. Documentation assistance provided by the scribe. I was present during the time the encounter was recorded. The information recorded by the scribe was done at my direction and has been reviewed and validated by me.        Sherryl Barters, MD  09/08/21 2356

## 2021-09-09 NOTE — Unmapped (Signed)
PULMONARY CONSULT  NOTE      Patient: Christian Young(28-Oct-1985)  Reason for consultation: Mr. Retter is a 36 y.o. male who is seen in consultation at the request of Pamalee Leyden, MD for comprehensive evaluation of hemoptysis and cystic fibrosis exacerbation.    Assessment and Recommendations:      Principal Problem:    Cystic fibrosis exacerbation (CMS-HCC)  Active Problems:    Essential hypertension    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)    Pancreatic insufficiency due to cystic fibrosis (CMS-HCC)    Chronic deep vein thrombosis (DVT) of lower extremity (CMS-HCC)    Hemoptysis  Resolved Problems:    * No resolved hospital problems. *      Recommendations:  - Continue Zosyn and tobramycin  - Continue home airway clearance with Vest, HS nebs, Breo and pulmozyme  -continue Trikafta  - daily BMP and magnesium  - continue panc enzyme replacement  -high protein high calorie diet  - likely PFT in approx 5-7 days  - follow up new sputum specimen culture     We appreciate the opportunity to assist in the care of this patient.  Please page [phone] with any questions.    Simonne Martinet, MD    Subjective:      History of Present Illness:  Christian Young is a 36 y.o. male admitted for hemoptysis and CF exacerbation. The patient notes that he had been started on oral antibx (dosy/cirpo) days due to concerns over developing CF exacerbation.  He was noting increased cough and low energy level, feeling very tired.  He reports that despite four days of oral antibx, he had further increase in cough yesterday and one episode of hemoptysis, about quarter size, mixed in with sputum.  No recurrence since.  He also apparently was found asleep or with some degree of altered mention by his wife (details unclear). This seemed to occur after a particularly intense coughing episode (perhaps cough syncope).   He presented to the ED last night and was admitted for likely CF exacerbation. Started on IV Zosyn and tobra.      This morning patient feels somewhat better but still with decreased energy and cough.  No new issues overnight.      Review of Systems: A comprehensive review of systems was performed and was negative except as above in HPI  Past Medical History:   Diagnosis Date   ??? Anxiety    ??? Chronic pain disorder    ??? Cystic fibrosis (CMS-HCC)    ??? Depression    ??? Hypertension    ??? Lumbar radiculopathy 10/26/2020   ??? Nonproductive cough 04/05/2018     Past Surgical History:   Procedure Laterality Date   ??? IR INSERT PORT AGE GREATER THAN 5 YRS  03/27/2019    IR INSERT PORT AGE GREATER THAN 5 YRS 03/27/2019 Rush Barer, MD IMG [facility]IR [facility]   ??? PR REMOVAL OF LUNG,LOBECTOMY Right 03/29/2017    Procedure: REMOVAL OF LUNG, OTHER THAN PNEUMONECTOMY; SINGLE LOBE (LOBECTOMY);  Surgeon: Cherie Dark, MD;  Location: MAIN OR [facility];  Service: Thoracic     Medications reviewed in Epic  Allergies as of 09/08/2021 - Reviewed 09/08/2021   Allergen Reaction Noted   ??? Aztreonam Anaphylaxis, Hives, Nausea And Vomiting, and Rash 09/06/2015   ??? [place] [aztreonam lysine] Anaphylaxis 12/27/2016   ??? Cefepime Itching, Nausea Only, and Other (See Comments) 09/06/2015   ??? Other Anaphylaxis and Other (See Comments) 09/06/2015   ???  Slo-bid 100 Anaphylaxis 06/20/2017   ??? Tobramycin Tinnitus 09/06/2015   ??? Banana Itching and Nausea And Vomiting 07/29/2016     Family History   Problem Relation Age of Onset   ??? Bipolar disorder Mother    ??? Depression Mother      Social History     Tobacco Use   ??? Smoking status: Former     Types: e-Cigarettes   ??? Smokeless tobacco: Never   Substance Use Topics   ??? Alcohol use: Not Currently     Alcohol/week: 3.0 standard drinks     Types: 3 Glasses of wine per week        Objective:      Physical Exam:  Vitals:    09/09/21 0038 09/09/21 0042 09/09/21 0745 09/09/21 1100   BP: 139/82  115/53    Pulse: 93  103 87   Resp: 18  18 18    Temp: 36.4 ??C (97.5 ??F)  36.2 ??C (97.2 ??F)    TempSrc: Temporal  Oral    SpO2: 97%  95% 97%   Weight:  (!) 110.8 kg (244 lb 4.3 oz)     Height:         General: Alert, well-appearing, and in no distress.  Eyes: Anicteric sclera, conjunctiva clear.  ENT:  Mucous membranes moist and intact.  Lungs: Normal excursion, no dullness to percussion. Good air movement bilaterally, without wheezes or crackles. Normal upper airway sounds without evidence of stridor.  Cardiovascular: Regular rate and rhythm.  Abdomen: Soft, non-tender  Skin: No rashes or lesions.  Neuro: No focal neurological deficits.              Diagnostic Review:   All labs and images were personally reviewed.

## 2021-09-09 NOTE — Unmapped (Signed)
Aminoglycoside Therapeutic Monitoring Pharmacy Note    Makale Pindell is a 36 y.o. male starting tobramycin. Date of therapy initiation: 09/08/21  Indication: CF Exacerbation     Prior Dosing Information: None/new initiation     Goals:  Therapeutic Drug Levels  ?? Trough level: tobramycin <1 mg/L  ?? Peak level: tobramycin 10-14 mg/L    Additional Clinical Monitoring/Outcomes  Renal function, volume status (intake and output)    Results:   ?? Not applicable   ?? Not applicable    Wt Readings from Last 1 Encounters:   09/08/21 (!) 111.1 kg (245 lb)     Lab Results   Component Value Date    CREATININE 0.73 09/08/2021       Pharmacokinetic Considerations and Significant Drug Interactions:  ??? Adult (estimated initial): Vd = 29.9 L, ke = 0.04521 hr-1  ??? Concurrent nephrotoxic meds: None identified     Assessment/Plan:  Recommendation(s)  ??? Continue current regimen of 280 mg IV every 12 hours  ??? Estimated peak and trough on recommended regimen: peak = 8 mg/L, trough = 0.1 mg/L    Follow-up  ??? Level due: 2 hrs and 8 hrs after first or second dose  ??? A pharmacist will continue to monitor and order levels as appropriate    Please page service pharmacist with questions/clarifications.    Winferd Humphrey, PharmD

## 2021-09-09 NOTE — Unmapped (Signed)
Patient presents here with c/o chest pain/ shortness of breath associated with cough with blood last night. States that he was on doxycyline and Cipro for lung infection. H/o cystic fibrosis

## 2021-09-09 NOTE — Unmapped (Addendum)
[facility] Medicine   History and Physical    Assessment/Plan:    Principal Problem:    Cystic fibrosis exacerbation (CMS-HCC)  Active Problems:    Essential hypertension    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)    Pancreatic insufficiency due to cystic fibrosis (CMS-HCC)    Chronic deep vein thrombosis (DVT) of lower extremity (CMS-HCC)    Hemoptysis  Resolved Problems:    * No resolved hospital problems. *      Christian Young is a 36 y.o. male with PMHx as noted below who presents to [facility] with Cystic fibrosis exacerbation (CMS-HCC).    1. Cystic Fibrosis exacerbation: Continues to have frequent cough, SOB, cold sweats despite 4 days of oral antibiotics. One episode of hemoptysis on 12/16, none since then.   - continue home montelukast 10mg  daily, cetirizine 10mg  daily,   - Airway clearance with albuterol nebs, hypertonic saline nebs,  Breo Ellipta 200-79mcg daily (substitute for Symbicort, not on formulary), pulmozyme daily  - Continue homeTrikafta (patient brought home supply)  - Airway clearance with vest  - start IV tobramycin (with pharmacy consult for dosing) and Zosyn 4.5mg  QID  - follow up sputum culture (previous cultures show susceptibility to above antibiotics)  - trend daily BMP to monitor renal function given antibiotic therapy  ??  2. DM: patient uses Omnipod at home  - while admitted, will start Lantus 40 units nightly, aspart 30 units with meals plus sliding scale, per prior admission.   ??  3. Pancreatic insufficiency  - continue home Creon (8 capsules with meals, 4 capsules with snacks)  ??  4. Mood disorder  - continue home fluoxetine 60mg  daily, amitriptyline 100mg  daily, and lamotrigine 400mg  daily  ??  5. History of DVT: Given risk for recurrent VTE and stable Hgb without any additional hemoptyses will continue anticoagulation for now, but will discontinue if ongoing bleeding.   - continue home Xarelto 20mg  daily  ??  6. GERD  - continue home pantoprazole 20mg  daily  ??  7. HTN  - holding home lisinopril given soft pressures in the ED  ??  8. RLS  - continue home mirapex 0.25mg  nightly  ??  9. Chronic back pain  - continue gabapentin 600mg  TID    FEN/GI/PPX  Reg diet  PO fluids  Xarelto    ___________________________________________________________________    Chief Complaint  Chief Complaint   Patient presents with   ??? Chest Pain       HPI:  Christian Young is a 36 y.o. male with PMHx as noted below who presents to [facility] with Cystic fibrosis exacerbation (CMS-HCC). Patient presents with symptoms consistent with CF exacerbation not improving despite oral antibiotics, as well as an episode of hemoptysis in the setting of a coughing episode. Patient first sarted having sinus complaints last week, not significantly helped with sinus rinses. He then developed some chills over the weekend and given this was started on doxycycline and cipro for presumed CF exacerbation. He has been on these for the past 4 days. He contacted the CF clinic today after having an episode of coughing so violently that he passed out last night. He also noted an episode of bright red bloody sputum during this coughing episode, but has not had any further hemoptysis since then. Given this he was encouraged to come to the ED for IV abx.     Allergies:  Aztreonam, [place] [aztreonam lysine], Cefepime, Other, Slo-bid 100, Tobramycin, and Banana  Medications:   Prior to Admission medications    Medication Dose, Route, Frequency   acetaminophen (TYLENOL) 500 MG tablet 500 mg, Oral, Every 6 hours PRN   albuterol (PROVENTIL HFA;VENTOLIN HFA) 90 mcg/actuation inhaler 2 puffs, Inhalation, Every 6 hours PRN   albuterol 2.5 mg /3 mL (0.083 %) nebulizer solution 2.5 mg, Nebulization, 2 times a day   amitriptyline (ELAVIL) 100 MG tablet TAKE ONE TABLET BY MOUTH AT BEDTIME  Patient taking differently: No sig reported   atorvastatin (LIPITOR) 20 MG tablet 20 mg, Oral, Nightly   blood sugar diagnostic (GLUCOSE BLOOD) Strp Other, 3 times a day (with meals), Rx sent to [facility] 01/04/20    blood-glucose meter kit Dispense meter that is preferred by patient's insurance company   blood-glucose meter,continuous (DEXCOM G6 RECEIVER) Misc Use as directed   blood-glucose sensor (DEXCOM G6 SENSOR) Devi Use sq as directed every 10 days   blood-glucose transmitter (DEXCOM G6 TRANSMITTER) Devi Use as directed, replace every 90 days   budesonide-formoteroL (SYMBICORT) 160-4.5 mcg/actuation inhaler 2 puffs, Inhalation, 2 times a day (standard)   cetirizine (ZYRTEC) 10 MG tablet 10 mg, Oral, Daily (standard)   ciprofloxacin HCl (CIPRO) 500 MG tablet 750 mg, Oral, 2 times a day (standard)   codeine-guaifenesin (GUAIFENESIN AC) 10-100 mg/5 mL liquid 5 mL, Oral, 3 times a day PRN   dornase alfa (PULMOZYME) 1 mg/mL nebulizer solution 2.5 mg, Inhalation, Daily (standard)   doxycycline (VIBRAMYCIN) 100 MG capsule 100 mg, Oral, 2 times a day (standard)   EASY TOUCH LANCING DEVICE Misc Use as directed.   EASY TOUCH TWIST LANCETS 30 gauge Misc Use as directed.   elexacaftor-tezacaftor-ivacaft (TRIKAFTA) 100-50-75 mg(d) /150 mg (n) tablet Take 2 Tablets (orange) by mouth in the morning and 1 tablet (blue) in the evening with fatty food   FLUoxetine 60 mg Tab 60 mg, Oral, Daily (standard)   fluticasone propionate (FLONASE) 50 mcg/actuation nasal spray 2 sprays, Each Nare, Daily (standard)   FREESTYLE LIBRE 14 DAY SENSOR kit Use 3-4 times daily   gabapentin (NEURONTIN) 600 MG tablet 600 mg, Oral, 3 times a day (standard)   insulin ASPART (NOVOLOG U-100 INSULIN ASPART) 100 unit/mL injection Subcutaneously infuse up to 150 units daily via insulin pump   insulin pump cart,automated,BT (OMNIPOD 5 G6 PODS, GEN 5,) Crtg 1 each, subcutaneous (via wearable injector), Every 3 days, Change every 72 hour   lamoTRIgine (LAMICTAL) 200 MG tablet 400 mg, Oral, Daily (standard)   lipase-protease-amylase (CREON) 36,000-114,000- 180,000 unit CpDR Take 8 caps by mouth with meals three times daily and 4 caps with snacks up to three times a day.   lisinopriL (PRINIVIL,ZESTRIL) 10 MG tablet 10 mg, Oral, Daily (standard)   melatonin 10 mg Tab 10 mg, Oral, Nightly PRN   montelukast (SINGULAIR) 10 mg tablet 10 mg, Oral, Nightly   MVW COMPLETE FORMUL PROBIOTIC 40 billion cell -15 mg CpDR 1 capsule, Oral, Daily (standard)  Patient taking differently: Take 1 capsule by mouth daily.   MVW Complete, pediatric multivit 61-D3-vit K, (MVW COMPLETE FORMULATION) 1,500-800 unit-mcg cap 2 capsules, Oral, 2 times a day (standard)   nebulizers Misc Use as directed with inhaled medications   omeprazole (PRILOSEC) 20 MG capsule 20 mg, Oral, Daily (standard)   pen needle, diabetic (BD ULTRA-FINE NANO PEN NEEDLE) 32 gauge x 5/32 (4 mm) Ndle use up to 4 times daily   pramipexole (MIRAPEX) 0.125 MG tablet 0.25 mg, Oral, Nightly   sodium chloride 7% 7 % Nebu 4 mL,  Nebulization, 2 times a day   traZODone (DESYREL) 150 MG tablet 150 mg, Oral, Nightly   XARELTO 20 mg tablet 20 mg, Oral, Daily (standard)       Medical History:  Past Medical History:   Diagnosis Date   ??? Anxiety    ??? Chronic pain disorder    ??? Cystic fibrosis (CMS-HCC)    ??? Depression    ??? Hypertension    ??? Lumbar radiculopathy 10/26/2020   ??? Nonproductive cough 04/05/2018       Surgical History:  Past Surgical History:   Procedure Laterality Date   ??? IR INSERT PORT AGE GREATER THAN 5 YRS  03/27/2019    IR INSERT PORT AGE GREATER THAN 5 YRS 03/27/2019 Rush Barer, MD IMG [facility]IR [facility]   ??? PR REMOVAL OF LUNG,LOBECTOMY Right 03/29/2017    Procedure: REMOVAL OF LUNG, OTHER THAN PNEUMONECTOMY; SINGLE LOBE (LOBECTOMY);  Surgeon: Cherie Dark, MD;  Location: MAIN OR [facility];  Service: Thoracic       Social History:  Social History     Social History Narrative   ??? Not on file     Social History     Tobacco Use   ??? Smoking status: Former     Types: e-Cigarettes   ??? Smokeless tobacco: Never   Vaping Use   ??? Vaping Use: Former   Substance Use Topics   ??? Alcohol use: Not Currently     Alcohol/week: 3.0 standard drinks     Types: 3 Glasses of wine per week   ??? Drug use: No       Family History:  Family History   Problem Relation Age of Onset   ??? Bipolar disorder Mother    ??? Depression Mother        Review of Systems:  10 systems reviewed and are negative unless otherwise mentioned in HPI    Physical Exam:  Temp:  [37.1 ??C (98.8 ??F)] 37.1 ??C (98.8 ??F)  Heart Rate:  [96-116] 96  SpO2 Pulse:  [96-106] 100  Resp:  [18-22] 18  BP: (91-138)/(61-109) 129/86  SpO2:  [95 %-98 %] 97 %  Body mass index is 33.23 kg/m??.    GEN: NAD, lying in bed  EYES: EOMI, pupils equal, round, reactive to light  CV: RRR, no murmurs appreciated  PULM: Faint expiratory wheezes throughout, no crackles or rales.   ABD: soft, normal bowel sounds, no masses, no HSM  EXT: No edema, good pulses  NEURO: No focal deficits  PSYCH: A+Ox3, appropriate    Test Results:  Recent Results (from the past 24 hour(s))   ECG 12 lead (Adult)    Collection Time: 09/08/21  4:27 PM   Result Value Ref Range    EKG Systolic BP  mmHg    EKG Diastolic BP  mmHg    EKG Ventricular Rate 101 BPM    EKG Atrial Rate 101 BPM    EKG P-R Interval 158 ms    EKG QRS Duration 86 ms    EKG Q-T Interval 330 ms    EKG QTC Calculation 427 ms    EKG Calculated P Axis 54 degrees    EKG Calculated R Axis 57 degrees    EKG Calculated T Axis 41 degrees    QTC Fredericia 392 ms   CBC w/ Differential    Collection Time: 09/08/21  4:40 PM   Result Value Ref Range    WBC 9.4 3.6 - 11.2 10*9/L    RBC 5.26 4.26 -  5.60 10*12/L    HGB 14.0 12.9 - 16.5 g/dL    HCT 52.8 41.3 - 24.4 %    MCV 78.9 77.6 - 95.7 fL    MCH 26.6 25.9 - 32.4 pg    MCHC 33.8 32.0 - 36.0 g/dL    RDW 01.0 (H) 27.2 - 15.2 %    MPV 8.6 6.8 - 10.7 fL    Platelet 281 150 - 450 10*9/L    nRBC 0 <=4 /100 WBCs    Neutrophils % 69.7 %    Lymphocytes % 20.7 %    Monocytes % 6.6 %    Eosinophils % 1.4 %    Basophils % 1.6 %    Absolute Neutrophils 6.6 1.8 - 7.8 10*9/L    Absolute Lymphocytes 2.0 1.1 - 3.6 10*9/L    Absolute Monocytes 0.6 0.3 - 0.8 10*9/L    Absolute Eosinophils 0.1 0.0 - 0.5 10*9/L    Absolute Basophils 0.2 (H) 0.0 - 0.1 10*9/L   Comprehensive metabolic panel    Collection Time: 09/08/21  4:42 PM   Result Value Ref Range    Sodium 134 (L) 135 - 145 mmol/L    Potassium 4.4 3.4 - 4.8 mmol/L    Chloride 101 98 - 107 mmol/L    CO2 24.4 20.0 - 31.0 mmol/L    Anion Gap 9 5 - 14 mmol/L    BUN 11 9 - 23 mg/dL    Creatinine 5.36 6.44 - 1.10 mg/dL    BUN/Creatinine Ratio 15     eGFR CKD-EPI (2021) Male >90 >=60 mL/min/1.67m2    Glucose 287 (H) 70 - 179 mg/dL    Calcium 9.8 8.7 - 03.4 mg/dL    Albumin 4.4 3.4 - 5.0 g/dL    Total Protein 7.7 5.7 - 8.2 g/dL    Total Bilirubin 0.4 0.3 - 1.2 mg/dL    AST 22 <=74 U/L    ALT 37 10 - 49 U/L    Alkaline Phosphatase 97 46 - 116 U/L   hsTroponin I (single, no delta)    Collection Time: 09/08/21  4:42 PM   Result Value Ref Range    hsTroponin I <3 <=53 ng/L   Rapid Influenza/RSV/COVID PCR    Collection Time: 09/08/21  5:57 PM    Specimen: Nasopharyngeal Swab   Result Value Ref Range    SARS-CoV-2 PCR Negative Negative    Influenza A Negative Negative    Influenza B Negative Negative    RSV Negative Negative

## 2021-09-10 LAB — CBC W/ AUTO DIFF
BASOPHILS ABSOLUTE COUNT: 0 10*9/L (ref 0.0–0.1)
BASOPHILS RELATIVE PERCENT: 1.1 %
EOSINOPHILS ABSOLUTE COUNT: 0.2 10*9/L (ref 0.0–0.5)
EOSINOPHILS RELATIVE PERCENT: 3.5 %
HEMATOCRIT: 37.6 % — ABNORMAL LOW (ref 39.0–48.0)
HEMOGLOBIN: 12.5 g/dL — ABNORMAL LOW (ref 12.9–16.5)
LYMPHOCYTES ABSOLUTE COUNT: 0.7 10*9/L — ABNORMAL LOW (ref 1.1–3.6)
LYMPHOCYTES RELATIVE PERCENT: 15.3 %
MEAN CORPUSCULAR HEMOGLOBIN CONC: 33.2 g/dL (ref 32.0–36.0)
MEAN CORPUSCULAR HEMOGLOBIN: 26.6 pg (ref 25.9–32.4)
MEAN CORPUSCULAR VOLUME: 80.2 fL (ref 77.6–95.7)
MEAN PLATELET VOLUME: 8 fL (ref 6.8–10.7)
MONOCYTES ABSOLUTE COUNT: 0.6 10*9/L (ref 0.3–0.8)
MONOCYTES RELATIVE PERCENT: 13 %
NEUTROPHILS ABSOLUTE COUNT: 3.1 10*9/L (ref 1.8–7.8)
NEUTROPHILS RELATIVE PERCENT: 67.1 %
NUCLEATED RED BLOOD CELLS: 0 /100{WBCs} (ref <=4)
PLATELET COUNT: 220 10*9/L (ref 150–450)
RED BLOOD CELL COUNT: 4.69 10*12/L (ref 4.26–5.60)
RED CELL DISTRIBUTION WIDTH: 16.2 % — ABNORMAL HIGH (ref 12.2–15.2)
WBC ADJUSTED: 4.6 10*9/L (ref 3.6–11.2)

## 2021-09-10 LAB — COMPREHENSIVE METABOLIC PANEL
ALBUMIN: 3.6 g/dL (ref 3.4–5.0)
ALKALINE PHOSPHATASE: 89 U/L (ref 46–116)
ALT (SGPT): 36 U/L (ref 10–49)
ANION GAP: 9 mmol/L (ref 5–14)
AST (SGOT): 30 U/L (ref <=34)
BILIRUBIN TOTAL: 0.4 mg/dL (ref 0.3–1.2)
BLOOD UREA NITROGEN: 12 mg/dL (ref 9–23)
BUN / CREAT RATIO: 15
CALCIUM: 8.7 mg/dL (ref 8.7–10.4)
CHLORIDE: 100 mmol/L (ref 98–107)
CO2: 28.9 mmol/L (ref 20.0–31.0)
CREATININE: 0.82 mg/dL
EGFR CKD-EPI (2021) MALE: >90 mL/min/{1.73_m2} (ref >=60)
GLUCOSE RANDOM: 148 mg/dL (ref 70–179)
POTASSIUM: 3.8 mmol/L (ref 3.4–4.8)
PROTEIN TOTAL: 6.6 g/dL (ref 5.7–8.2)
SODIUM: 138 mmol/L (ref 135–145)

## 2021-09-10 LAB — TOBRAMYCIN LEVEL, PEAK: TOBRAMYCIN PEAK: 7.3 ug/mL (ref 4.0–10.0)

## 2021-09-10 LAB — TOBRAMYCIN LEVEL, RANDOM: TOBRAMYCIN RANDOM: 2.9 ug/mL

## 2021-09-10 MED ADMIN — amitriptyline (ELAVIL) tablet 100 mg: 100 mg | ORAL | @ 01:00:00

## 2021-09-10 MED ADMIN — montelukast (SINGULAIR) tablet 10 mg: 10 mg | ORAL | @ 01:00:00

## 2021-09-10 MED ADMIN — oxyCODONE (ROXICODONE) immediate release tablet 5 mg: 5 mg | ORAL | @ 01:00:00 | Stop: 2021-09-11

## 2021-09-10 MED ADMIN — lipase-protease-amylase (CREON) 36,000-114,000-180,000 unit capsule *patient supplied*: 8 | ORAL | @ 14:00:00

## 2021-09-10 MED ADMIN — rivaroxaban (XARELTO) tablet 20 mg: 20 mg | ORAL | @ 14:00:00

## 2021-09-10 MED ADMIN — sodium chloride 7% nebulizer solution 4 mL: 4 mL | RESPIRATORY_TRACT | @ 02:00:00

## 2021-09-10 MED ADMIN — polyethylene glycol (MIRALAX) packet 17 g: 17 g | ORAL | @ 14:00:00

## 2021-09-10 MED ADMIN — gabapentin (NEURONTIN) capsule 600 mg: 600 mg | ORAL | @ 18:00:00

## 2021-09-10 MED ADMIN — dornase alfa (PULMOZYME) 1 mg/mL solution 2.5 mg: 2.5 mg | RESPIRATORY_TRACT | @ 16:00:00

## 2021-09-10 MED ADMIN — cetirizine (ZyrTEC) tablet 10 mg: 10 mg | ORAL | @ 14:00:00

## 2021-09-10 MED ADMIN — piperacillin-tazobactam (ZOSYN) IVPB (premix) 4.5 g: 4.5 g | INTRAVENOUS | @ 05:00:00 | Stop: 2021-09-12

## 2021-09-10 MED ADMIN — fluticasone propionate (FLONASE) 50 mcg/actuation nasal spray 2 spray: 2 | NASAL | @ 14:00:00

## 2021-09-10 MED ADMIN — oxyCODONE (ROXICODONE) immediate release tablet 5 mg: 5 mg | ORAL | @ 14:00:00 | Stop: 2021-09-11

## 2021-09-10 MED ADMIN — tobramycin (NEBCIN) 280 mg in sodium chloride (NS) 0.9 % 100 mL IVPB: 280 mg | INTRAVENOUS | @ 14:00:00 | Stop: 2021-09-10

## 2021-09-10 MED ADMIN — lamoTRIgine (LaMICtal) tablet 400 mg: 400 mg | ORAL | @ 14:00:00

## 2021-09-10 MED ADMIN — piperacillin-tazobactam (ZOSYN) IVPB (premix) 4.5 g: 4.5 g | INTRAVENOUS | @ 11:00:00 | Stop: 2021-09-12

## 2021-09-10 MED ADMIN — elexacaftor-tezacaftor-ivacaft (TRIKAFTA) *patient supplied: 1 | ORAL | @ 01:00:00

## 2021-09-10 MED ADMIN — piperacillin-tazobactam (ZOSYN) IVPB (premix) 4.5 g: 4.5 g | INTRAVENOUS | @ 23:00:00 | Stop: 2021-09-12

## 2021-09-10 MED ADMIN — pramipexole (MIRAPEX) tablet 0.25 mg: .25 mg | ORAL | @ 01:00:00

## 2021-09-10 MED ADMIN — piperacillin-tazobactam (ZOSYN) IVPB (premix) 4.5 g: 4.5 g | INTRAVENOUS | @ 18:00:00 | Stop: 2021-09-12

## 2021-09-10 MED ADMIN — lipase-protease-amylase (CREON) 36,000-114,000-180,000 unit capsule *patient supplied*: 8 | ORAL | @ 23:00:00

## 2021-09-10 MED ADMIN — pantoprazole (PROTONIX) EC tablet 20 mg: 20 mg | ORAL | @ 14:00:00

## 2021-09-10 MED ADMIN — traZODone (DESYREL) tablet 150 mg: 150 mg | ORAL | @ 01:00:00

## 2021-09-10 MED ADMIN — pediatric multivitamin-vit D3 1,500 unit-vit K 800 mcg (MVW COMPLETE FORMULATION) capsule: 2 | ORAL | @ 14:00:00

## 2021-09-10 MED ADMIN — FLUoxetine (PROzac) capsule 60 mg: 60 mg | ORAL | @ 14:00:00

## 2021-09-10 MED ADMIN — elexacaftor-tezacaftor-ivacaft (TRIKAFTA) *patient supplied*: 2 | ORAL | @ 14:00:00

## 2021-09-10 MED ADMIN — albuterol 2.5 mg /3 mL (0.083 %) nebulizer solution 2.5 mg: 2.5 mg | RESPIRATORY_TRACT | @ 02:00:00

## 2021-09-10 MED ADMIN — tobramycin (NEBCIN) 280 mg in sodium chloride (NS) 0.9 % 100 mL IVPB: 280 mg | INTRAVENOUS | @ 01:00:00 | Stop: 2021-09-17

## 2021-09-10 MED ADMIN — sodium chloride 7% nebulizer solution 4 mL: 4 mL | RESPIRATORY_TRACT | @ 16:00:00

## 2021-09-10 MED ADMIN — oxyCODONE (ROXICODONE) immediate release tablet 5 mg: 5 mg | ORAL | @ 18:00:00 | Stop: 2021-09-11

## 2021-09-10 MED ADMIN — atorvastatin (LIPITOR) tablet 40 mg: 40 mg | ORAL | @ 01:00:00

## 2021-09-10 MED ADMIN — gabapentin (NEURONTIN) capsule 600 mg: 600 mg | ORAL | @ 14:00:00

## 2021-09-10 MED ADMIN — gabapentin (NEURONTIN) capsule 600 mg: 600 mg | ORAL | @ 01:00:00

## 2021-09-10 MED ADMIN — pediatric multivitamin-vit D3 1,500 unit-vit K 800 mcg (MVW COMPLETE FORMULATION) capsule: 2 | ORAL | @ 01:00:00

## 2021-09-10 MED ADMIN — fluticasone furoate-vilanteroL (BREO ELLIPTA) 200-25 mcg/dose inhaler 1 puff: 1 | RESPIRATORY_TRACT | @ 16:00:00

## 2021-09-10 MED ADMIN — lipase-protease-amylase (CREON) 36,000-114,000-180,000 unit capsule *patient supplied*: 8 | ORAL | @ 18:00:00

## 2021-09-10 MED ADMIN — albuterol 2.5 mg /3 mL (0.083 %) nebulizer solution 2.5 mg: 2.5 mg | RESPIRATORY_TRACT | @ 16:00:00

## 2021-09-10 NOTE — Unmapped (Signed)
Admitted 09/09/20    Christian Young is a 36 y.o. male that presented to [facility] with Cystic fibrosis exacerbation (CMS-HCC).    In brief, patient presented with symptoms consistent with a CF exacerbation. He had received a trial of oral antibiotics without improvement and additionally had an episode of hemoptysis, chills, and coughing do violently that he passed out. He contacted his CF clinic which instructed him to admitted for IV abx.     Cystic Fibrosis exacerbation  Continues to have frequent cough, SOB, cold sweats despite 4 days of oral antibiotics. One episode of hemoptysis on 12/16, none since then. Sent in by his CF clinic and pulmonary was consulted inpatient. They recommended conitnueing IV antibiotics: Tobramycin and Zosyn for duration ***. Her was continued on his home airway clearance with albuterol nebs, hypertonic saline nebs,  Breo Ellipta 200-68mcg daily (substitute for Symbicort, not on formulary), pulmozyme daily in addition to chest vest. CF sputum culture grew ***. Repeat PFTs showed ***.     DM:   Patient continued home insulin pump while we checked accuchecks.     Pancreatic insufficiency  continue home Creon (8 capsules with meals, 4 capsules with snacks)     Mood disorder  continue home fluoxetine 60mg  daily, amitriptyline 100mg  daily, and lamotrigine 400mg  daily     History of DVT:   Given risk for recurrent VTE and stable Hgb without any additional hemoptyses will continue anticoagulation for now, but will discontinue if ongoing bleeding.   - continue home Xarelto 20mg  daily     GERD  - continue home pantoprazole 20mg  daily     HTN  - holding home lisinopril given soft pressures in the ED     RLS  - continue home mirapex 0.25mg  nightly     Chronic back pain  - continue gabapentin 600mg  TID

## 2021-09-10 NOTE — Unmapped (Signed)
Pt in with cystic fibrosis.  VSS, no c/o pain.  Receiving IV antibiotics, tolerating without difficulty.  Insulin pump to LLQ, pt tolerating without difficulty.  Independent with adls.  Verbalized understanding of POC.  Will continue to work towards prioritized goals.   Problem: Adult Inpatient Plan of Care  Goal: Plan of Care Review  Outcome: Progressing     Problem: Infection  Goal: Absence of Infection Signs and Symptoms  Outcome: Progressing     Problem: Diabetes Comorbidity  Goal: Blood Glucose Level Within Targeted Range  Outcome: Progressing

## 2021-09-10 NOTE — Unmapped (Signed)
Hospital Medicine Daily Progress Note    Assessment/Plan:    Principal Problem:    Cystic fibrosis exacerbation (CMS-HCC)  Active Problems:    Essential hypertension    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)    Pancreatic insufficiency due to cystic fibrosis (CMS-HCC)    Chronic deep vein thrombosis (DVT) of lower extremity (CMS-HCC)    Hemoptysis  Resolved Problems:    * No resolved hospital problems. *                 Christian Young is a 36 y.o. male that presented to [facility] with Cystic fibrosis exacerbation (CMS-HCC).    Cystic Fibrosis exacerbation: Continues to have frequent cough, SOB, cold sweats despite 4 days of oral antibiotics. One episode of hemoptysis on 12/16, none since then.  Pulmonary recommendations in place.  - continue home montelukast 10mg  daily, cetirizine 10mg  daily,   - Airway clearance with albuterol nebs, hypertonic saline nebs,  Breo Ellipta 200-3mcg daily (substitute for Symbicort, not on formulary), pulmozyme daily  - Continue homeTrikafta (patient brought home supply)  - Airway clearance with vest  - Continue tobramycin (dosing by pharmacy and zosyn per Pulmonology  - follow up sputum culture (previous cultures show susceptibility to above antibiotics), still awaiting specimen  -Daily labs, monitor renal function  ??  DM: patient uses Omnipod insulin pump with basal continuous infusion rate and bolus dosing that he self-administers. Per [facility] policy patient can continue using home insulin pump as he is alert and oriented and capable of doing so. He brought his own supplies and vials  -Continue home insulin pump per home dosing, no reports of symptomatic hypoglycemia  -Continue accuchecks  -high calorie, high protein diet    Pancreatic insufficiency  - continue home Creon (8 capsules with meals, 4 capsules with snacks)  ??  Mood disorder  -??continue home fluoxetine 60mg  daily, amitriptyline 100mg  daily, and lamotrigine 400mg  daily.  He seems in good spirits.  ??  History of DVT: Continue home anticoagulation, has history of recurrent VTE, continue to monitor for any hemoptysis.     - continue home Xarelto 20mg  daily    GERD  -Home pantoprazole.  ??  HTN  - Blood pressure normal, improving from low normal pressures in ER.  Adjust as clinically indicated.  ??  Restless leg syndrome  - continue home mirapex 0.25mg  nightly no report of any breakthrough pain or other symptoms.  ??  Chronic back pain  - continue gabapentin 600mg  TID    ___________________________________________________________________    Subjective:  No acute events overnight, no complaint of fever, chills, worsening cough.  Feeling a bit better overall      Labs/Studies:  Labs and Studies from the last 24hrs per EMR and Reviewed    Objective:  Temp:  [36.3 ??C (97.3 ??F)-36.5 ??C (97.7 ??F)] 36.3 ??C (97.3 ??F)  Heart Rate:  [91-110] 107  Resp:  [18-20] 20  BP: (109-127)/(62-66) 127/66  SpO2:  [96 %-100 %] 99 %    General: No acute distress.  HEENT: Mucous membranes moist, no icterus  CV: RRR,  Resp: No adventitial breath sounds, no increased work of breathing, respiratory rate normal.  Abdominal: Soft.  Extremities: No cyanosis, clubbing or edema.  Skin: Warm and dry, no rash  Neuro: Alert, oriented, conversant.    25 minutes, over 50% spent in counseling and coordination of care    Tawni Levy MD

## 2021-09-10 NOTE — Unmapped (Signed)
Hospital Medicine Daily Progress Note    Assessment/Plan:    Principal Problem:    Cystic fibrosis exacerbation (CMS-HCC)  Active Problems:    Essential hypertension    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)    Pancreatic insufficiency due to cystic fibrosis (CMS-HCC)    Chronic deep vein thrombosis (DVT) of lower extremity (CMS-HCC)    Hemoptysis  Resolved Problems:    * No resolved hospital problems. *                 Christian Young is a 36 y.o. male that presented to [facility] with Cystic fibrosis exacerbation (CMS-HCC).    Cystic Fibrosis exacerbation: Continues to have frequent cough, SOB, cold sweats despite 4 days of oral antibiotics. One episode of hemoptysis on 12/16, none since then. Sent in by pulmonology and consulted on here  - Pulmonary consult appreciate recomendations  - continue home montelukast 10mg  daily, cetirizine 10mg  daily,   - Airway clearance with albuterol nebs, hypertonic saline nebs,  Breo Ellipta 200-59mcg daily (substitute for Symbicort, not on formulary), pulmozyme daily  - Continue homeTrikafta (patient brought home supply)  - Airway clearance with vest  - Continue tobramycin (dosing by pharmacy and zosyn per   - follow up sputum culture (previous cultures show susceptibility to above antibiotics)  - trend daily BMP to monitor renal function given antibiotic therapy  ??  DM: patient uses Omnipod insulin pump with basal continuous infusion rate and bolus dosing that he self-administers. Per [facility] policy patient can continue using home insulin pump as he is alert and oriented and capable of doing so. He brought his own supplies and vials  -Continue home insulin pump per home dosing  -Continue accuchecks  -high calorie, high protein diet  -nutrition consult  ??  Pancreatic insufficiency  - continue home Creon (8 capsules with meals, 4 capsules with snacks)  ??  Mood disorder  -??continue home fluoxetine 60mg  daily, amitriptyline 100mg  daily, and lamotrigine 400mg  daily  ??  History of DVT: Given risk for recurrent VTE and stable Hgb without any additional hemoptyses will continue anticoagulation for now, but will discontinue if ongoing bleeding.   - continue home Xarelto 20mg  daily    GERD  - continue home pantoprazole 20mg  daily  ??  HTN  - holding home lisinopril given soft pressures in the ED  ??  RLS  - continue home mirapex 0.25mg  nightly  ??  Chronic back pain  - continue gabapentin 600mg  TID    ___________________________________________________________________    Subjective:  No acute events overnight admitted. Patient quite tired as he only slept 1 hour overnight. Discussed specifications of insulin pump and looked at his vials. Feels slightly better since being in the hospital    Denies any fevers, chills, night sweats, nausea vomiting, diarrhea, constipation. Continues to have mild cough    Labs/Studies:  Labs and Studies from the last 24hrs per EMR and Reviewed    Objective:  Temp:  [36.2 ??C (97.2 ??F)-36.5 ??C (97.7 ??F)] 36.5 ??C (97.7 ??F)  Heart Rate:  [86-116] 104  SpO2 Pulse:  [75-106] 85  Resp:  [18-22] 18  BP: (91-139)/(53-109) 109/62  SpO2:  [95 %-98 %] 96 %    VS reviewed. Nursing note reviewed.  General: Well-appearing, well-hydrated, in no acute distress  HEENT: NCAT, no conjunctival injection,  MMM, no oral lesions, oropharynx clear   Neck: Supple, no lymphadenopathy  CV: RRR, normal S1/S2, no murmur  Resp: CTAB, good aeration, no increased WOB  Abdominal: Soft, NTND, +BS, no masses, no HSM  Extremities: WWP, 2+ pulses, no edema, cap refill <3 seconds  Skin: Warm and dry, no rash  Neuro: Alert and oriented, PERRL, EOMI, face symmetric, speech clear, tongue protrudes in midline, MAEE with normal tone and strength

## 2021-09-10 NOTE — Unmapped (Signed)
A&Ox4 with NAD, VSS, no oxygen requirement, no c/o pain, no concerns,  call bell and telephone within reach,  side table and personal items within reach, independent in all ADL's,  contact precautions maintained, see flowsheets for details       Problem: Adult Inpatient Plan of Care  Goal: Plan of Care Review  Outcome: Progressing  Goal: Patient-Specific Goal (Individualized)  Outcome: Progressing  Goal: Absence of Hospital-Acquired Illness or Injury  Outcome: Progressing  Goal: Optimal Comfort and Wellbeing  Outcome: Progressing  Goal: Readiness for Transition of Care  Outcome: Progressing  Goal: Rounds/Family Conference  Outcome: Progressing     Problem: Infection  Goal: Absence of Infection Signs and Symptoms  Outcome: Progressing     Problem: Pain Acute  Goal: Acceptable Pain Control and Functional Ability  Outcome: Progressing     Problem: Fall Injury Risk  Goal: Absence of Fall and Fall-Related Injury  Outcome: Progressing     Problem: Infection (Pneumonia)  Goal: Resolution of Infection Signs and Symptoms  Outcome: Progressing     Problem: Respiratory Compromise (Pneumonia)  Goal: Effective Oxygenation and Ventilation  Outcome: Progressing     Problem: Diabetes Comorbidity  Goal: Blood Glucose Level Within Targeted Range  Outcome: Progressing

## 2021-09-10 NOTE — Unmapped (Signed)
PULMONARY CONSULT  NOTE      Patient: Christian Young([birth year])  Reason for consultation: Christian Young is a 36 y.o. male who is seen in consultation at the request of Rica Koyanagi, MD for comprehensive evaluation of hemoptysis and cystic fibrosis exacerbation.    Assessment and Recommendations:      Principal Problem:    Cystic fibrosis exacerbation (CMS-HCC)  Active Problems:    Essential hypertension    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)    Pancreatic insufficiency due to cystic fibrosis (CMS-HCC)    Chronic deep vein thrombosis (DVT) of lower extremity (CMS-HCC)    Hemoptysis  Resolved Problems:    * No resolved hospital problems. *      Recommendations:  - Continue Zosyn and tobramycin as you are.  - Continue home airway clearance with Vest, HS nebs, Breo and pulmozyme  -continue Trikafta  - daily BMP and magnesium  - continue panc enzyme replacement  -high protein high calorie diet  - consider PFT early this week for comparison after 1 week of IV antibx.    - follow up new sputum specimen culture --still awaiting specimen to be acquired.      We appreciate the opportunity to assist in the care of this patient.  Please page [phone] with any questions.    Simonne Martinet, MD    Subjective:      INITIAL History of Present Illness:  Christian Young is a 36 y.o. male admitted for hemoptysis and CF exacerbation. The patient notes that he had been started on oral antibx (dosy/cirpo) days due to concerns over developing CF exacerbation.  He was noting increased cough and low energy level, feeling very tired.  He reports that despite four days of oral antibx, he had further increase in cough yesterday and one episode of hemoptysis, about quarter size, mixed in with sputum.  No recurrence since.  He also apparently was found asleep or with some degree of altered mention by his wife (details unclear). This seemed to occur after a particularly intense coughing episode (perhaps cough syncope).   He presented to the ED last night and was admitted for likely CF exacerbation. Started on IV Zosyn and tobra.      This morning patient feels somewhat better but still with decreased energy and cough.  No new issues overnight.      INTERVAL HPI 09/10/21:    Patient with no new complaints today.  Feels slightly better but states it usually take 5-6 days of IV antibx for him to start feeling much better when he has CF exacerbation.  No further hemoptysis.        Review of Systems: A comprehensive review of systems was performed and was negative except as above in HPI  Past Medical History:   Diagnosis Date   ??? Anxiety    ??? Chronic pain disorder    ??? Cystic fibrosis (CMS-HCC)    ??? Depression    ??? Hypertension    ??? Lumbar radiculopathy 10/26/2020   ??? Nonproductive cough 04/05/2018     Past Surgical History:   Procedure Laterality Date   ??? IR INSERT PORT AGE GREATER THAN 5 YRS  03/27/2019    IR INSERT PORT AGE GREATER THAN 5 YRS 03/27/2019 Rush Barer, MD IMG [facility]   ??? PR REMOVAL OF LUNG,LOBECTOMY Right 03/29/2017    Procedure: REMOVAL OF LUNG, OTHER THAN PNEUMONECTOMY; SINGLE LOBE (LOBECTOMY);  Surgeon: Cherie Dark, MD;  Location:  MAIN OR [facility];  Service: Thoracic     Medications reviewed in Epic  Allergies as of 09/08/2021 - Reviewed 09/08/2021   Allergen Reaction Noted   ??? Aztreonam Anaphylaxis, Hives, Nausea And Vomiting, and Rash 09/06/2015   ??? [place] [aztreonam lysine] Anaphylaxis 12/27/2016   ??? Cefepime Itching, Nausea Only, and Other (See Comments) 09/06/2015   ??? Other Anaphylaxis and Other (See Comments) 09/06/2015   ??? Slo-bid 100 Anaphylaxis 06/20/2017   ??? Tobramycin Tinnitus 09/06/2015   ??? Banana Itching and Nausea And Vomiting 07/29/2016     Family History   Problem Relation Age of Onset   ??? Bipolar disorder Mother    ??? Depression Mother      Social History     Tobacco Use   ??? Smoking status: Former     Types: e-Cigarettes   ??? Smokeless tobacco: Never   Substance Use Topics   ??? Alcohol use: Not Currently     Alcohol/week: 3.0 standard drinks     Types: 3 Glasses of wine per week        Objective:      Physical Exam:  Vitals:    09/09/21 1611 09/09/21 2127 09/10/21 0002 09/10/21 0749   BP: 109/62  121/65 127/66   Pulse: 104 110 103 91   Resp: 18 18 20 20    Temp: 36.5 ??C (97.7 ??F)  36.5 ??C (97.7 ??F) 36.3 ??C (97.3 ??F)   TempSrc: Oral  Oral Temporal   SpO2: 96% 97% 100% 97%   Weight:       Height:         General: Alert, well-appearing, and in no distress.  Eyes: Anicteric sclera, conjunctiva clear.  ENT:  Mucous membranes moist and intact.  Lungs: Normal excursion, no dullness to percussion. Good air movement bilaterally, without wheezes or crackles. Normal upper airway sounds without evidence of stridor.  Cardiovascular: Regular rate and rhythm.  Abdomen: Soft, non-tender  Skin: No rashes or lesions.  Neuro: No focal neurological deficits.              Diagnostic Review:   All labs and images were personally reviewed.

## 2021-09-10 NOTE — Unmapped (Signed)
Pt in with cystic fibrosis exacerbation.  VSS, receiving IV antibiotics, tolerating without difficulty.  C/o rib pain, relieved with po oxycodone.   Independent with adls.  Bed in lowest position, call bell with in reach.  Verablized understanding of POC.  Will continue to work towards prioritized goals.    Problem: Adult Inpatient Plan of Care  Goal: Plan of Care Review  Outcome: Progressing     Problem: Infection  Goal: Absence of Infection Signs and Symptoms  Outcome: Progressing     Problem: Pain Acute  Goal: Acceptable Pain Control and Functional Ability  Outcome: Progressing     Problem: Diabetes Comorbidity  Goal: Blood Glucose Level Within Targeted Range  Outcome: Progressing

## 2021-09-11 LAB — COMPREHENSIVE METABOLIC PANEL
ALBUMIN: 3.4 g/dL (ref 3.4–5.0)
ALKALINE PHOSPHATASE: 81 U/L (ref 46–116)
ALT (SGPT): 44 U/L (ref 10–49)
ANION GAP: 8 mmol/L (ref 5–14)
AST (SGOT): 33 U/L (ref <=34)
BILIRUBIN TOTAL: 0.3 mg/dL (ref 0.3–1.2)
BLOOD UREA NITROGEN: 10 mg/dL (ref 9–23)
BUN / CREAT RATIO: 13
CALCIUM: 8.7 mg/dL (ref 8.7–10.4)
CHLORIDE: 102 mmol/L (ref 98–107)
CO2: 30.1 mmol/L (ref 20.0–31.0)
CREATININE: 0.8 mg/dL
EGFR CKD-EPI (2021) MALE: >90 mL/min/{1.73_m2} (ref >=60)
GLUCOSE RANDOM: 128 mg/dL (ref 70–179)
POTASSIUM: 4.1 mmol/L (ref 3.4–4.8)
PROTEIN TOTAL: 6.1 g/dL (ref 5.7–8.2)
SODIUM: 140 mmol/L (ref 135–145)

## 2021-09-11 LAB — CBC W/ AUTO DIFF
BASOPHILS ABSOLUTE COUNT: 0 10*9/L (ref 0.0–0.1)
BASOPHILS RELATIVE PERCENT: 0.3 %
EOSINOPHILS ABSOLUTE COUNT: 0.2 10*9/L (ref 0.0–0.5)
EOSINOPHILS RELATIVE PERCENT: 4.2 %
HEMATOCRIT: 36.3 % — ABNORMAL LOW (ref 39.0–48.0)
HEMOGLOBIN: 12.4 g/dL — ABNORMAL LOW (ref 12.9–16.5)
LYMPHOCYTES ABSOLUTE COUNT: 1.1 10*9/L (ref 1.1–3.6)
LYMPHOCYTES RELATIVE PERCENT: 23.1 %
MEAN CORPUSCULAR HEMOGLOBIN CONC: 34.2 g/dL (ref 32.0–36.0)
MEAN CORPUSCULAR HEMOGLOBIN: 27.1 pg (ref 25.9–32.4)
MEAN CORPUSCULAR VOLUME: 79.3 fL (ref 77.6–95.7)
MEAN PLATELET VOLUME: 8 fL (ref 6.8–10.7)
MONOCYTES ABSOLUTE COUNT: 0.6 10*9/L (ref 0.3–0.8)
MONOCYTES RELATIVE PERCENT: 12.7 %
NEUTROPHILS ABSOLUTE COUNT: 2.9 10*9/L (ref 1.8–7.8)
NEUTROPHILS RELATIVE PERCENT: 59.7 %
NUCLEATED RED BLOOD CELLS: 0 /100{WBCs} (ref <=4)
PLATELET COUNT: 229 10*9/L (ref 150–450)
RED BLOOD CELL COUNT: 4.58 10*12/L (ref 4.26–5.60)
RED CELL DISTRIBUTION WIDTH: 16.3 % — ABNORMAL HIGH (ref 12.2–15.2)
WBC ADJUSTED: 4.9 10*9/L (ref 3.6–11.2)

## 2021-09-11 MED ADMIN — tobramycin (NEBCIN) 460 mg in sodium chloride (NS) 0.9 % 100 mL IVPB: 460 mg | INTRAVENOUS | @ 11:00:00 | Stop: 2021-09-19

## 2021-09-11 MED ADMIN — gabapentin (NEURONTIN) capsule 600 mg: 600 mg | ORAL | @ 20:00:00

## 2021-09-11 MED ADMIN — pramipexole (MIRAPEX) tablet 0.25 mg: .25 mg | ORAL | @ 01:00:00

## 2021-09-11 MED ADMIN — lipase-protease-amylase (CREON) 36,000-114,000-180,000 unit capsule *patient supplied*: 8 | ORAL | @ 18:00:00

## 2021-09-11 MED ADMIN — oxyCODONE (ROXICODONE) immediate release tablet 5 mg: 5 mg | ORAL | Stop: 2021-09-13

## 2021-09-11 MED ADMIN — piperacillin-tazobactam (ZOSYN) IVPB (premix) 4.5 g: 4.5 g | INTRAVENOUS | @ 16:00:00 | Stop: 2021-09-12

## 2021-09-11 MED ADMIN — sodium chloride 7% nebulizer solution 4 mL: 4 mL | RESPIRATORY_TRACT | @ 14:00:00

## 2021-09-11 MED ADMIN — FLUoxetine (PROzac) capsule 60 mg: 60 mg | ORAL | @ 14:00:00

## 2021-09-11 MED ADMIN — rivaroxaban (XARELTO) tablet 20 mg: 20 mg | ORAL | @ 14:00:00

## 2021-09-11 MED ADMIN — oxyCODONE (ROXICODONE) immediate release tablet 5 mg: 5 mg | ORAL | @ 01:00:00 | Stop: 2021-09-11

## 2021-09-11 MED ADMIN — pediatric multivitamin-vit D3 1,500 unit-vit K 800 mcg (MVW COMPLETE FORMULATION) capsule: 2 | ORAL | @ 01:00:00

## 2021-09-11 MED ADMIN — lipase-protease-amylase (CREON) 36,000-114,000-180,000 unit capsule *patient supplied*: 8 | ORAL | @ 22:00:00

## 2021-09-11 MED ADMIN — piperacillin-tazobactam (ZOSYN) IVPB (premix) 4.5 g: 4.5 g | INTRAVENOUS | @ 23:00:00 | Stop: 2021-09-12

## 2021-09-11 MED ADMIN — gabapentin (NEURONTIN) capsule 600 mg: 600 mg | ORAL | @ 01:00:00

## 2021-09-11 MED ADMIN — lamoTRIgine (LaMICtal) tablet 400 mg: 400 mg | ORAL | @ 14:00:00

## 2021-09-11 MED ADMIN — fluticasone propionate (FLONASE) 50 mcg/actuation nasal spray 2 spray: 2 | NASAL | @ 18:00:00

## 2021-09-11 MED ADMIN — polyethylene glycol (MIRALAX) packet 17 g: 17 g | ORAL | @ 14:00:00

## 2021-09-11 MED ADMIN — sodium chloride 7% nebulizer solution 4 mL: 4 mL | RESPIRATORY_TRACT | @ 02:00:00

## 2021-09-11 MED ADMIN — traZODone (DESYREL) tablet 150 mg: 150 mg | ORAL | @ 01:00:00

## 2021-09-11 MED ADMIN — albuterol 2.5 mg /3 mL (0.083 %) nebulizer solution 2.5 mg: 2.5 mg | RESPIRATORY_TRACT | @ 14:00:00

## 2021-09-11 MED ADMIN — pantoprazole (PROTONIX) EC tablet 20 mg: 20 mg | ORAL | @ 14:00:00

## 2021-09-11 MED ADMIN — elexacaftor-tezacaftor-ivacaft (TRIKAFTA) *patient supplied*: 2 | ORAL | @ 14:00:00

## 2021-09-11 MED ADMIN — piperacillin-tazobactam (ZOSYN) IVPB (premix) 4.5 g: 4.5 g | INTRAVENOUS | @ 04:00:00 | Stop: 2021-09-12

## 2021-09-11 MED ADMIN — cetirizine (ZyrTEC) tablet 10 mg: 10 mg | ORAL | @ 14:00:00

## 2021-09-11 MED ADMIN — piperacillin-tazobactam (ZOSYN) IVPB (premix) 4.5 g: 4.5 g | INTRAVENOUS | @ 10:00:00 | Stop: 2021-09-12

## 2021-09-11 MED ADMIN — amitriptyline (ELAVIL) tablet 100 mg: 100 mg | ORAL | @ 01:00:00

## 2021-09-11 MED ADMIN — fluticasone furoate-vilanteroL (BREO ELLIPTA) 200-25 mcg/dose inhaler 1 puff: 1 | RESPIRATORY_TRACT | @ 14:00:00

## 2021-09-11 MED ADMIN — albuterol 2.5 mg /3 mL (0.083 %) nebulizer solution 2.5 mg: 2.5 mg | RESPIRATORY_TRACT | @ 02:00:00

## 2021-09-11 MED ADMIN — atorvastatin (LIPITOR) tablet 40 mg: 40 mg | ORAL | @ 01:00:00

## 2021-09-11 MED ADMIN — lipase-protease-amylase (CREON) 36,000-114,000-180,000 unit capsule *patient supplied*: 8 | ORAL | @ 14:00:00

## 2021-09-11 MED ADMIN — dornase alfa (PULMOZYME) 1 mg/mL solution 2.5 mg: 2.5 mg | RESPIRATORY_TRACT | @ 14:00:00

## 2021-09-11 MED ADMIN — pediatric multivitamin-vit D3 1,500 unit-vit K 800 mcg (MVW COMPLETE FORMULATION) capsule: 2 | ORAL | @ 14:00:00

## 2021-09-11 MED ADMIN — gabapentin (NEURONTIN) capsule 600 mg: 600 mg | ORAL | @ 14:00:00

## 2021-09-11 MED ADMIN — montelukast (SINGULAIR) tablet 10 mg: 10 mg | ORAL | @ 01:00:00

## 2021-09-11 MED ADMIN — elexacaftor-tezacaftor-ivacaft (TRIKAFTA) *patient supplied: 1 | ORAL | @ 01:00:00

## 2021-09-11 NOTE — Unmapped (Signed)
Called the patient to discuss his admission at [facility]. The patient stated all his respiratory needs have been met. He has an [place] and his home chest vest for airway clearance. Expressed no home needs at this time. Hopes to discharge in the next day or so. Will continue to monitor.

## 2021-09-11 NOTE — Unmapped (Signed)
Pt continues to receive ABT, denies any pain and is sleeping at this moment. VS WN normal. Will continue to monitor patient.  Problem: Adult Inpatient Plan of Care  Goal: Plan of Care Review  Outcome: Ongoing - Unchanged  Goal: Patient-Specific Goal (Individualized)  Outcome: Ongoing - Unchanged  Goal: Absence of Hospital-Acquired Illness or Injury  Outcome: Ongoing - Unchanged  Intervention: Identify and Manage Fall Risk  Recent Flowsheet Documentation  Taken 09/11/2021 0000 by Amil Amen, RN  Safety Interventions: fall reduction program maintained  Taken 09/10/2021 2200 by Amil Amen, RN  Safety Interventions: fall reduction program maintained  Intervention: Prevent and Manage VTE (Venous Thromboembolism) Risk  Recent Flowsheet Documentation  Taken 09/10/2021 2000 by Roetta Sessions Renny Gunnarson, RN  Activity Management: activity adjusted per tolerance  Goal: Optimal Comfort and Wellbeing  Outcome: Ongoing - Unchanged  Goal: Readiness for Transition of Care  Outcome: Ongoing - Unchanged  Goal: Rounds/Family Conference  Outcome: Ongoing - Unchanged     Problem: Infection  Goal: Absence of Infection Signs and Symptoms  Outcome: Ongoing - Unchanged     Problem: Pain Acute  Goal: Acceptable Pain Control and Functional Ability  Outcome: Ongoing - Unchanged     Problem: Fall Injury Risk  Goal: Absence of Fall and Fall-Related Injury  Outcome: Ongoing - Unchanged  Intervention: Promote Injury-Free Environment  Recent Flowsheet Documentation  Taken 09/11/2021 0000 by Amil Amen, RN  Safety Interventions: fall reduction program maintained  Taken 09/10/2021 2200 by Roetta Sessions Semiyah Newgent, RN  Safety Interventions: fall reduction program maintained     Problem: Infection (Pneumonia)  Goal: Resolution of Infection Signs and Symptoms  Outcome: Ongoing - Unchanged     Problem: Respiratory Compromise (Pneumonia)  Goal: Effective Oxygenation and Ventilation  Outcome: Ongoing - Unchanged     Problem: Diabetes Comorbidity  Goal: Blood Glucose Level Within Targeted Range  Outcome: Ongoing - Unchanged

## 2021-09-11 NOTE — Unmapped (Addendum)
Aminoglycoside Therapeutic Monitoring Pharmacy Note    Christian Young is a 36 y.o. male continuing tobramycin. Date of therapy initiation: 01/27    Indication: CF exacerbation    Prior Dosing Information: Current regimen tobramycin 280 mg IV Q12H      Goals:  Therapeutic Drug Levels  ?? Trough level: tobramycin <1 mg/L  ?? Peak level: tobramycin 10-14 mg/L    Additional Clinical Monitoring/Outcomes  Renal function, volume status (intake and output)    Results:   ?? Trough level: 2.9 mg/L, drawn 1754 (Extrapolated trough: 2.0 mg/L)   ?? Peak level: 7.3 mg/L, drawn 1103 (Extrapolated Peak: 8.9 mg/L)    Wt Readings from Last 1 Encounters:   09/09/21 (!) 110.8 kg (244 lb 4.3 oz)     Lab Results   Component Value Date    CREATININE 0.82 09/10/2021       Pharmacokinetic Considerations and Significant Drug Interactions:  ??? Adult (calculated on 01/29): Vd = 38.3 L, ke = 0.1319 hr-1  ??? Concurrent nephrotoxic meds: Not applicable    Assessment/Plan:  Recommendation(s)  ??? Change current regimen to tobrmaycin 460 mg IV Q24H starting 0500 on 01/30 (when level expected to be <1)  ??? Estimated peak and trough on recommended regimen: peak = 12.1 mg/L, trough = 0.5 mg/L    Follow-up  ??? Level due: at 2 hrs and 8 hrs after first or second dose  ??? A pharmacist will continue to monitor and order levels as appropriate    Please page service pharmacist with questions/clarifications.    Duaine Dredge, PharmD

## 2021-09-11 NOTE — Unmapped (Signed)
PULMONARY CONSULT  NOTE      Patient: Christian Young(11-21-1985)  Reason for consultation: Christian Young is a 36 y.o. male who is seen in consultation at the request of Rica Koyanagi, MD for comprehensive evaluation of hemoptysis and cystic fibrosis exacerbation.    Assessment and Recommendations:      Principal Problem:    Cystic fibrosis exacerbation (CMS-HCC)  Active Problems:    Essential hypertension    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)    Pancreatic insufficiency due to cystic fibrosis (CMS-HCC)    Chronic deep vein thrombosis (DVT) of lower extremity (CMS-HCC)    Hemoptysis  Resolved Problems:    * No resolved hospital problems. *      Recommendations:  - Continue Zosyn and tobramycin.  - Continue home airway clearance with Vest, HS nebs, Breo and pulmozyme  - Continue Trikafta  - Daily BMP and magnesium  - Continue panc enzyme replacement  - High protein high calorie diet  - Consider PFT this week for comparison after 1 week of IV antibx.    - Follow up new sputum specimen culture --still awaiting specimen to be acquired.      We appreciate the opportunity to assist in the care of this patient.  Please page [phone] with any questions.        Lezlee Gills O. Wellington Hampshire, MD   Assistant Professor of Medicine  Pulmonary and Critical Care Medicine  [facility] Multidisciplinary Sarcoidosis Program  [facility] Pulmonary Hypertension Program       Subjective:      INITIAL History of Present Illness:  Christian Young is a 36 y.o. male admitted for hemoptysis and CF exacerbation. The patient notes that he had been started on oral antibx (dosy/cirpo) days due to concerns over developing CF exacerbation.  He was noting increased cough and low energy level, feeling very tired.  He reports that despite four days of oral antibx, he had further increase in cough yesterday and one episode of hemoptysis, about quarter size, mixed in with sputum.  No recurrence since.  He also apparently was found asleep or with some degree of altered mention by his wife (details unclear). This seemed to occur after a particularly intense coughing episode (perhaps cough syncope).   He presented to the ED last night and was admitted for likely CF exacerbation. Started on IV Zosyn and tobra.      This morning patient feels somewhat better but still with decreased energy and cough.  No new issues overnight.      INTERVAL HPI 09/11/21:  No acute events overnight.  Feels well overall and from pulmonary standpoint.      Review of Systems: A comprehensive review of systems was performed and was negative except as above in HPI  Past Medical History:   Diagnosis Date   ??? Anxiety    ??? Chronic pain disorder    ??? Cystic fibrosis (CMS-HCC)    ??? Depression    ??? Hypertension    ??? Lumbar radiculopathy 10/26/2020   ??? Nonproductive cough 04/05/2018     Past Surgical History:   Procedure Laterality Date   ??? IR INSERT PORT AGE GREATER THAN 5 YRS  03/27/2019    IR INSERT PORT AGE GREATER THAN 5 YRS 03/27/2019 Rush Barer, MD IMG [facility]   ??? PR REMOVAL OF LUNG,LOBECTOMY Right 03/29/2017    Procedure: REMOVAL OF LUNG, OTHER THAN PNEUMONECTOMY; SINGLE LOBE (LOBECTOMY);  Surgeon: Cherie Dark, MD;  Location: MAIN OR  [facility]  Service: Thoracic     Medications reviewed in Epic  Allergies as of 09/08/2021 - Reviewed 09/08/2021   Allergen Reaction Noted   ??? Aztreonam Anaphylaxis, Hives, Nausea And Vomiting, and Rash 09/06/2015   ??? [place] [aztreonam lysine] Anaphylaxis 12/27/2016   ??? Cefepime Itching, Nausea Only, and Other (See Comments) 09/06/2015   ??? Other Anaphylaxis and Other (See Comments) 09/06/2015   ??? Slo-bid 100 Anaphylaxis 06/20/2017   ??? Tobramycin Tinnitus 09/06/2015   ??? Banana Itching and Nausea And Vomiting 07/29/2016     Family History   Problem Relation Age of Onset   ??? Bipolar disorder Mother    ??? Depression Mother      Social History     Tobacco Use   ??? Smoking status: Former     Types: e-Cigarettes   ??? Smokeless tobacco: Never   Substance Use Topics   ??? Alcohol use: Not Currently     Alcohol/week: 3.0 standard drinks     Types: 3 Glasses of wine per week        Objective:      Physical Exam:  Vitals:    09/10/21 1055 09/10/21 1600 09/10/21 2014 09/11/21 0754   BP:  133/89 131/74 113/74   Pulse: 107 104 102 86   Resp: 20 20 20 18    Temp:  36.4 ??C (97.6 ??F) 35.9 ??C (96.6 ??F) 36.3 ??C (97.3 ??F)   TempSrc:  Temporal Axillary Oral   SpO2: 99% 96% 96% 95%   Weight:       Height:         General: Alert, well-appearing, and in no distress.  Eyes: Anicteric sclera, conjunctiva clear.  ENT:  Mucous membranes moist and intact.  Lungs: Normal excursion, no dullness to percussion. Good air movement bilaterally, without wheezes or crackles. Normal upper airway sounds without evidence of stridor.  Cardiovascular: Regular rate and rhythm.  Abdomen: Soft, non-tender  Skin: No rashes or lesions.  Neuro: No focal neurological deficits.              Diagnostic Review:   All labs and images were personally reviewed.

## 2021-09-11 NOTE — Unmapped (Signed)
Cystic Fibrosis Nutrition Assessment    Inpatient, Telehealth via phone: Nutrition Risk Screening this admission for CF and related follow up  Primary Pulmonary Provider: Dr [name]  ===================================================================  Christian Young is a 36 y.o. male w/ PMH significant for cystic fibrosis, pancreatic insufficiency, diabetes who presented on 1/27 for CF exacerbation  ===================================================================  INTERVENTION:    1. Change diet to High Calorie High Protein    2. Defer to team about Vit K supplementation while on IV abx given anticoagulation    3. Ordered peanut butter and jelly sandwich on lunch tray per pt request    4. Consider acid reducer    5. Continue remainder of nutrition regimen:  - bowel regimen as ordered  - insulin regimen per endocrinology  - MVW gel cap BID  - enzyme regimen (home supply)    Inpatient:   Will follow up with patient per protocol: 1-2 times per week (and more frequent as indicated)  Monitor for potential discharge needs with multi-disciplinary team.   ===================================================================  ASSESSMENT:  Nutrition Category = Adult Obesity Class 1: BMI 30 to < 35 kg/m2    Estimated daily needs: 3284 kcal/day, 136-181 g PRO/day, 3368 mL fluid/day      Calories estimated using: Cystic Fibrosis Conference Formula, protein per DRI x 1.5-2, fluid per [facility]    Current diet is not appropriate for CF. Current PO intake is adequate to meet estimated CF needs. Patient continues to work towards goals for weight management.   Enzyme dose is within established guidelines. Vitamin prescription is appropriate to reach/maintain optimal fat soluble vitamin levels. Insulin regimen is appropriate for carbohydrate intake. Patient on CFTR modulator is consuming adequate amounts of fat-containing foods with prescribed medication to optimize absorption.    ASPEN/AND Malnutrition Screening:  Patient does not meet ASPEN/AND criteria for malnutrition at this time.    Goals:  1. Ongoing:  Meet estimated daily needs  2. Ongoing:  Reach/maintain established anthropometric goals for Adult CF: BMI < 30 kg/m2   3. Ongoing:  Normal fat-soluble vitamin levels: Vitamin A, Vitamin E and PT per lab range; Vitamin D 25OH total >30   4. Ongoing:  Maintain glucose control. Carbohydrate content of diet should comprise 40-50% of total calorie needs, but carbohydrates are not restricted in this population.    5. Ongoing:  Meet sodium needs for CF     Nutrition goals reviewed, and relevant barriers identified and addressed: none evident.   Patient is evaluated to have good  willingness and ability to achieve nutrition goals.   ===================================================================  INPATIENT:  Christian Young is admitted with Cystic fibrosis with pulmonary exacerbation (CMS-HCC) [E84.0].    Current Nutrition Orders (inpatient):  Oral intake        Nutrition Orders   (From admission, onward)             Start     Ordered    09/09/21 1219  Nutrition Therapy Regular/House  Effective now        Comments: High protein high calorie diet   Question:  Nutrition Therapy:  Answer:  Regular/House    09/09/21 1218              CF Nutrition related medications (inpatient): Nutritionally relevant medications reviewed.   trikafta  Creon 36,000 x 8 caps/meal, 4 caps/snack  protonix  MVW gel cap twice daily  IV antibiotics  miralax  Home insulin pump    CF Nutrition related labs (inpatient): reviewed  ==================================================================  CLINICAL DATA:  Past Medical History:   Diagnosis Date   ??? Anxiety    ??? Chronic pain disorder    ??? Cystic fibrosis (CMS-HCC)    ??? Depression    ??? Hypertension    ??? Lumbar radiculopathy 10/26/2020   ??? Nonproductive cough 04/05/2018     Patient Active Problem List   Diagnosis   ??? Essential hypertension   ??? Depressive disorder   ??? Mood disorder (CMS-HCC)   ??? Anxiety   ??? 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 Chronic pansinusitis   ??? Pancreatic insufficiency due to cystic fibrosis (CMS-HCC)   ??? History of Mycobacterium abscessus infection   ??? Bronchiectasis (CMS-HCC)   ??? Chronic deep vein thrombosis (DVT) of lower extremity (CMS-HCC)   ??? Cystic fibrosis with pulmonary exacerbation (CMS-HCC)   ??? Cystic fibrosis exacerbation (CMS-HCC)   ??? Obesity (BMI 30-39.9)   ??? Restless leg syndrome   ??? Degeneration of lumbar intervertebral disc   ??? Lumbar radiculopathy   ??? Idiopathic peripheral neuropathy   ??? Long term current use of anticoagulant therapy   ??? Acute non-recurrent pansinusitis   ??? AKI (acute kidney injury) (CMS-HCC)   ??? Hemoptysis     Anthroprometric Evaluation:  Weight changes: weight largely stable  CFTR modulator and weight change: On Trikafta  BMI Readings from Last 3 Encounters:   09/09/21 33.13 kg/m??   07/30/21 33.23 kg/m??   06/08/21 33.91 kg/m??     Wt Readings from Last 3 Encounters:   09/09/21 (!) 110.8 kg (244 lb 4.3 oz)   07/30/21 (!) 111.1 kg (245 lb)   06/08/21 (!) 113.4 kg (250 lb)     Ht Readings from Last 3 Encounters:   09/08/21 182.9 cm (6')   07/29/21 182.9 cm (6')   06/08/21 182.9 cm (6')     ==================================================================  Energy Intake (outpatient):  Diet: Christian Young endorses slight decrease in appetite prior to admission. Reports, the more IV antibiotics I get, the hungrier I get    Allergies, Intolerances, Sensitivities, and/or Cultural/Religious Dietary Restrictions:  include banana  Allergies   Allergen Reactions   ??? Aztreonam Anaphylaxis, Hives, Nausea And Vomiting and Rash     fevers  Patient stated that he only vomited x1 with [place] in the past.????    fevers  Patient stated that he only vomited x1 with [place] in the past.????   ??? [place] [Aztreonam Lysine] Anaphylaxis   ??? Cefepime Itching, Nausea Only and Other (See Comments)     Headaches also   ??? Other Anaphylaxis and Other (See Comments)     Other reaction(s): Other (See Comments)  Bananas: itchy throat  Slo Bid record from [employer] states anaphylaxis.????Pt states this was from childhood and does not know reaction.  Bananas, causes itchy throat   ??? Slo-Bid 100 Anaphylaxis   ??? Tobramycin Tinnitus     From OSH record-documented as tinnitus but has received IV tobra with close monitoring.  Pt has received Tobramycin at [facility] since this allergy documented    From OSH record-documented as tinnitus but has received IV tobra with close monitoring.   ??? Banana Itching and Nausea And Vomiting   Sodium in diet: historically adequate  Calcium in diet:  historically adequate  Food Insecurity:   Food Insecurity: No Food Insecurity   ??? Worried About Programme researcher, broadcasting/film/video in the Last Year: Never true   ??? Ran Out of Food in the Last Year: Never true   CFTR modulator and  Diet: Prescribed Trikafta (elexacaftor/tezacaftor/ivacaftor).  PO Supplements: none  Patient resources for DME/formula:  -none  Appetite Stimulant: none  Enteral feeding tube: none  Physical activity: limited recently d/t back pain; not discussed today    GI/Malabsorption (outpatient):  Enzyme brand, (meals/snacks):  Creon 36,000 @ 8/meal and 4/snack  Enzyme administration details: correct pre-meal administration., good compliance at all meals and snacks  Enzyme dose per MEAL (units lipase/kg/meal) 2599  Enzyme dose per DAY (units lipase/kg/day) 45409  GI meds:  Nutritionally relevant medications reviewed. Miralax, colace, probiotic, prilosec  Stools: last documented BM 1/29 x 1  Fecal Fat Studies: insufficient  No results found for: [id number]  Lab Results   Component Value Date    ELAST <15 (L) 03/19/2017     No results found for: PELAI    Vitamins/Minerals (outpatient):  CF-specific MVI, dose, compliance: MVW Complete Formulation Softgel regular 2 daily, good compliance  Other vitamins/minerals/herbals: none  Patient Resources for vitamins: [employer]  phone [phone]. Active through September 2023.  Calcium supplement: none  Fat-soluble vitamin levels: within normal limits June 2022 with exception of PT. PT historically elevated with normal DCP. Would likely benefit from recheck of DCP.  Lab Results   Component Value Date    VITAMINA 58.7 02/06/2021    VITAMINA 42.4 01/18/2020    VITAMINA 44.4 11/19/2016     Lab Results   Component Value Date    CRP 5.0 07/30/2021    CRP 10.0 06/15/2021    CRP 45.0 (H) 06/08/2021     Lab Results   Component Value Date    VITDTOTAL 38.2 02/06/2021    VITDTOTAL 27.9 03/19/2017     Lab Results   Component Value Date    VITAME 16.0 02/06/2021     Lab Results   Component Value Date    PT 23.3 (H) 07/30/2021    PT 14.3 (H) 06/12/2021    PT 14.7 (H) 06/08/2021     Lab Results   Component Value Date    DESGCARBPT 0.2 03/20/2017     No results found for: PIVKAII    Bone Health: Normal vitamin D level. Adequate calcium intake from diet. Normal bone density on DEXA 02/2017.    CF Related Diabetes: yes; on insulin with insulin pump. Followed by endocrinology.  Lab Results   Component Value Date    GLUF 178 (H) 01/18/2020     No results found for: GLUCOSE2HR  Lab Results   Component Value Date    A1C 9.0 (H) 06/08/2021    A1C 10.0 (H) 03/09/2021    A1C 10.7 (H) 11/29/2020       Jackqulyn Livings MPH, RD, LDN  Pager: [phone]

## 2021-09-12 LAB — CBC W/ AUTO DIFF
BASOPHILS ABSOLUTE COUNT: 0.1 10*9/L (ref 0.0–0.1)
BASOPHILS RELATIVE PERCENT: 1.4 %
EOSINOPHILS ABSOLUTE COUNT: 0.3 10*9/L (ref 0.0–0.5)
EOSINOPHILS RELATIVE PERCENT: 5.9 %
HEMATOCRIT: 36.1 % — ABNORMAL LOW (ref 39.0–48.0)
HEMOGLOBIN: 12.2 g/dL — ABNORMAL LOW (ref 12.9–16.5)
LYMPHOCYTES ABSOLUTE COUNT: 1.2 10*9/L (ref 1.1–3.6)
LYMPHOCYTES RELATIVE PERCENT: 26.5 %
MEAN CORPUSCULAR HEMOGLOBIN CONC: 33.8 g/dL (ref 32.0–36.0)
MEAN CORPUSCULAR HEMOGLOBIN: 26.7 pg (ref 25.9–32.4)
MEAN CORPUSCULAR VOLUME: 79 fL (ref 77.6–95.7)
MEAN PLATELET VOLUME: 7.9 fL (ref 6.8–10.7)
MONOCYTES ABSOLUTE COUNT: 0.6 10*9/L (ref 0.3–0.8)
MONOCYTES RELATIVE PERCENT: 13.6 %
NEUTROPHILS ABSOLUTE COUNT: 2.5 10*9/L (ref 1.8–7.8)
NEUTROPHILS RELATIVE PERCENT: 52.6 %
NUCLEATED RED BLOOD CELLS: 0 /100{WBCs} (ref <=4)
PLATELET COUNT: 238 10*9/L (ref 150–450)
RED BLOOD CELL COUNT: 4.57 10*12/L (ref 4.26–5.60)
RED CELL DISTRIBUTION WIDTH: 16 % — ABNORMAL HIGH (ref 12.2–15.2)
WBC ADJUSTED: 4.7 10*9/L (ref 3.6–11.2)

## 2021-09-12 LAB — MAGNESIUM: MAGNESIUM: 1.6 mg/dL (ref 1.6–2.6)

## 2021-09-12 LAB — BASIC METABOLIC PANEL
ANION GAP: 8 mmol/L (ref 5–14)
BLOOD UREA NITROGEN: 10 mg/dL (ref 9–23)
BUN / CREAT RATIO: 11
CALCIUM: 9.4 mg/dL (ref 8.7–10.4)
CHLORIDE: 102 mmol/L (ref 98–107)
CO2: 31.1 mmol/L — ABNORMAL HIGH (ref 20.0–31.0)
CREATININE: 0.91 mg/dL
EGFR CKD-EPI (2021) MALE: >90 mL/min/{1.73_m2} (ref >=60)
GLUCOSE RANDOM: 144 mg/dL (ref 70–179)
POTASSIUM: 4.3 mmol/L (ref 3.4–4.8)
SODIUM: 141 mmol/L (ref 135–145)

## 2021-09-12 LAB — TOBRAMYCIN LEVEL, PEAK: TOBRAMYCIN PEAK: 11.2 ug/mL — ABNORMAL HIGH (ref 4.0–10.0)

## 2021-09-12 LAB — TOBRAMYCIN LEVEL, RANDOM: TOBRAMYCIN RANDOM: 2.1 ug/mL

## 2021-09-12 MED ADMIN — traZODone (DESYREL) tablet 150 mg: 150 mg | ORAL | @ 02:00:00

## 2021-09-12 MED ADMIN — rivaroxaban (XARELTO) tablet 20 mg: 20 mg | ORAL | @ 13:00:00 | Stop: 2021-09-12

## 2021-09-12 MED ADMIN — acetaminophen (TYLENOL) tablet 650 mg: 650 mg | ORAL | @ 04:00:00

## 2021-09-12 MED ADMIN — sodium chloride 7% nebulizer solution 4 mL: 4 mL | RESPIRATORY_TRACT | @ 01:00:00

## 2021-09-12 MED ADMIN — albuterol 2.5 mg /3 mL (0.083 %) nebulizer solution 2.5 mg: 2.5 mg | RESPIRATORY_TRACT | @ 01:00:00

## 2021-09-12 MED ADMIN — pediatric multivitamin-vit D3 1,500 unit-vit K 800 mcg (MVW COMPLETE FORMULATION) capsule: 2 | ORAL | @ 02:00:00

## 2021-09-12 MED ADMIN — amitriptyline (ELAVIL) tablet 100 mg: 100 mg | ORAL | @ 02:00:00

## 2021-09-12 MED ADMIN — montelukast (SINGULAIR) tablet 10 mg: 10 mg | ORAL | @ 02:00:00

## 2021-09-12 MED ADMIN — piperacillin-tazobactam (ZOSYN) IVPB (premix) 4.5 g: 4.5 g | INTRAVENOUS | @ 16:00:00 | Stop: 2021-09-12

## 2021-09-12 MED ADMIN — pediatric multivitamin-vit D3 1,500 unit-vit K 800 mcg (MVW COMPLETE FORMULATION) capsule: 2 | ORAL | @ 13:00:00 | Stop: 2021-09-12

## 2021-09-12 MED ADMIN — lipase-protease-amylase (CREON) 36,000-114,000-180,000 unit capsule *patient supplied*: 8 | ORAL | @ 13:00:00 | Stop: 2021-09-12

## 2021-09-12 MED ADMIN — FLUoxetine (PROzac) capsule 60 mg: 60 mg | ORAL | @ 13:00:00 | Stop: 2021-09-12

## 2021-09-12 MED ADMIN — atorvastatin (LIPITOR) tablet 40 mg: 40 mg | ORAL | @ 02:00:00

## 2021-09-12 MED ADMIN — fluticasone propionate (FLONASE) 50 mcg/actuation nasal spray 2 spray: 2 | NASAL | @ 13:00:00 | Stop: 2021-09-12

## 2021-09-12 MED ADMIN — elexacaftor-tezacaftor-ivacaft (TRIKAFTA) *patient supplied: 1 | ORAL | @ 02:00:00

## 2021-09-12 MED ADMIN — pantoprazole (PROTONIX) EC tablet 20 mg: 20 mg | ORAL | @ 13:00:00 | Stop: 2021-09-12

## 2021-09-12 MED ADMIN — sodium chloride 7% nebulizer solution 4 mL: 4 mL | RESPIRATORY_TRACT | @ 14:00:00 | Stop: 2021-09-12

## 2021-09-12 MED ADMIN — cetirizine (ZyrTEC) tablet 10 mg: 10 mg | ORAL | @ 13:00:00 | Stop: 2021-09-12

## 2021-09-12 MED ADMIN — elexacaftor-tezacaftor-ivacaft (TRIKAFTA) *patient supplied*: 2 | ORAL | @ 13:00:00 | Stop: 2021-09-12

## 2021-09-12 MED ADMIN — heparin, porcine (PF) 100 unit/mL injection 300 Units: 300 [IU] | INTRAVENOUS | @ 17:00:00 | Stop: 2021-09-12

## 2021-09-12 MED ADMIN — piperacillin-tazobactam (ZOSYN) IVPB (premix) 4.5 g: 4.5 g | INTRAVENOUS | @ 10:00:00 | Stop: 2021-09-12

## 2021-09-12 MED ADMIN — gabapentin (NEURONTIN) capsule 600 mg: 600 mg | ORAL | @ 02:00:00

## 2021-09-12 MED ADMIN — piperacillin-tazobactam (ZOSYN) IVPB (premix) 4.5 g: 4.5 g | INTRAVENOUS | @ 05:00:00 | Stop: 2021-09-12

## 2021-09-12 MED ADMIN — fluticasone furoate-vilanteroL (BREO ELLIPTA) 200-25 mcg/dose inhaler 1 puff: 1 | RESPIRATORY_TRACT | @ 14:00:00 | Stop: 2021-09-12

## 2021-09-12 MED ADMIN — dornase alfa (PULMOZYME) 1 mg/mL solution 2.5 mg: 2.5 mg | RESPIRATORY_TRACT | @ 14:00:00 | Stop: 2021-09-12

## 2021-09-12 MED ADMIN — lipase-protease-amylase (CREON) 36,000-114,000-180,000 unit capsule *patient supplied*: 8 | ORAL | @ 17:00:00 | Stop: 2021-09-12

## 2021-09-12 MED ADMIN — lamoTRIgine (LaMICtal) tablet 400 mg: 400 mg | ORAL | @ 13:00:00 | Stop: 2021-09-12

## 2021-09-12 MED ADMIN — pramipexole (MIRAPEX) tablet 0.25 mg: .25 mg | ORAL | @ 02:00:00

## 2021-09-12 MED ADMIN — albuterol 2.5 mg /3 mL (0.083 %) nebulizer solution 2.5 mg: 2.5 mg | RESPIRATORY_TRACT | @ 14:00:00 | Stop: 2021-09-12

## 2021-09-12 MED ADMIN — tobramycin (NEBCIN) 460 mg in sodium chloride (NS) 0.9 % 100 mL IVPB: 460 mg | INTRAVENOUS | @ 09:00:00 | Stop: 2021-09-12

## 2021-09-12 MED ADMIN — gabapentin (NEURONTIN) capsule 600 mg: 600 mg | ORAL | @ 13:00:00 | Stop: 2021-09-12

## 2021-09-12 MED ADMIN — polyethylene glycol (MIRALAX) packet 17 g: 17 g | ORAL | @ 13:00:00 | Stop: 2021-09-12

## 2021-09-12 NOTE — Unmapped (Signed)
Pt on RA and took all inhaled medications with no adverse reactions. Pt used [place] and has home chest vest he uses independently for mucus clearance. States he is not been coughing up anything at this time. Everything rinsed and placed out to dry. Care for this pt continues.     Problem: Respiratory Compromise (Pneumonia)  Goal: Effective Oxygenation and Ventilation  Intervention: Promote Airway Secretion Clearance  Recent Flowsheet Documentation  Taken 09/11/2021 2012 by Dayle Points, RRT  Breathing Techniques/Airway Clearance:   deep/controlled cough encouraged   diaphragmatic breathing promoted   huff-cough encouraged

## 2021-09-12 NOTE — Unmapped (Signed)
Patient stable and orders received to DC to home with home infusion.  MD in to review plan with patient and family.  Tobramycin random take as ordered, De-accessed the post after flush and heparinization done. Skin intact at site.  DC instructions given and patient verbalized understanding.   Discharged to home stable  Problem: Adult Inpatient Plan of Care  Goal: Plan of Care Review  Outcome: Discharged to Home  Goal: Patient-Specific Goal (Individualized)  Outcome: Discharged to Home  Goal: Absence of Hospital-Acquired Illness or Injury  Outcome: Discharged to Home  Intervention: Identify and Manage Fall Risk  Recent Flowsheet Documentation  Taken 09/12/2021 0800 by Veverly Fells, RN  Safety Interventions:   isolation precautions   fall reduction program maintained  Goal: Optimal Comfort and Wellbeing  Outcome: Discharged to Home  Goal: Readiness for Transition of Care  Outcome: Discharged to Home  Goal: Rounds/Family Conference  Outcome: Discharged to Home     Problem: Infection  Goal: Absence of Infection Signs and Symptoms  Outcome: Discharged to Home     Problem: Pain Acute  Goal: Acceptable Pain Control and Functional Ability  Outcome: Discharged to Home     Problem: Fall Injury Risk  Goal: Absence of Fall and Fall-Related Injury  Outcome: Discharged to Home  Intervention: Promote Injury-Free Environment  Recent Flowsheet Documentation  Taken 09/12/2021 0800 by Veverly Fells, RN  Safety Interventions:   isolation precautions   fall reduction program maintained     Problem: Infection (Pneumonia)  Goal: Resolution of Infection Signs and Symptoms  Outcome: Discharged to Home     Problem: Respiratory Compromise (Pneumonia)  Goal: Effective Oxygenation and Ventilation  Outcome: Discharged to Home  Intervention: Optimize Oxygenation and Ventilation  Recent Flowsheet Documentation  Taken 09/12/2021 0800 by Veverly Fells, RN  Head of Bed [facility]) Positioning: HOB elevated     Problem: Diabetes Comorbidity  Goal: Blood Glucose Level Within Targeted Range  Outcome: Discharged to Home

## 2021-09-12 NOTE — Unmapped (Signed)
Patient A&O x 4, Vitally stable, patient denies pain and discomfort.  Lungs noted clear and patient denies cough.  Continues with IV antibiotics via portacath, tolerating well. Patient up ambulating in room, remains on contact isloation. Tolerating oral intake, patient verbalizes blood sugar control via patient pump system, no glycemic reaction noted.  Progressing well with all care.  Problem: Adult Inpatient Plan of Care  Goal: Plan of Care Review  Outcome: Progressing  Goal: Patient-Specific Goal (Individualized)  Outcome: Progressing  Goal: Absence of Hospital-Acquired Illness or Injury  Outcome: Progressing  Intervention: Identify and Manage Fall Risk  Recent Flowsheet Documentation  Taken 09/11/2021 0800 by Veverly Fells, RN  Safety Interventions:   fall reduction program maintained   isolation precautions  Intervention: Prevent Skin Injury  Recent Flowsheet Documentation  Taken 09/11/2021 0800 by Veverly Fells, RN  Skin Protection: adhesive use limited  Goal: Optimal Comfort and Wellbeing  Outcome: Progressing  Goal: Readiness for Transition of Care  Outcome: Progressing  Goal: Rounds/Family Conference  Outcome: Progressing     Problem: Infection  Goal: Absence of Infection Signs and Symptoms  Outcome: Progressing     Problem: Pain Acute  Goal: Acceptable Pain Control and Functional Ability  Outcome: Progressing     Problem: Infection (Pneumonia)  Goal: Resolution of Infection Signs and Symptoms  Outcome: Progressing     Problem: Respiratory Compromise (Pneumonia)  Goal: Effective Oxygenation and Ventilation  Outcome: Progressing  Intervention: Optimize Oxygenation and Ventilation  Recent Flowsheet Documentation  Taken 09/11/2021 0800 by Veverly Fells, RN  Head of Bed [facility]) Positioning: HOB elevated     Problem: Diabetes Comorbidity  Goal: Blood Glucose Level Within Targeted Range  Outcome: Progressing

## 2021-09-12 NOTE — Unmapped (Signed)
Hospital Medicine Daily Progress Note    Assessment/Plan:    Principal Problem:    Cystic fibrosis exacerbation (CMS-HCC)  Active Problems:    Essential hypertension    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)    Pancreatic insufficiency due to cystic fibrosis (CMS-HCC)    Chronic deep vein thrombosis (DVT) of lower extremity (CMS-HCC)    Hemoptysis  Resolved Problems:    * No resolved hospital problems. *                 Christian Young is a 36 y.o. male that presented to [facility] with Cystic fibrosis exacerbation (CMS-HCC).    Cystic Fibrosis exacerbation: Continues to have frequent cough, SOB, cold sweats despite 4 days of oral antibiotics. One episode of hemoptysis on 12/16, none since then.  Pulmonary recommendations in place.  Discharge home in a.m. to complete intravenous antibiotics.  - continue home montelukast 10mg  daily, cetirizine 10mg  daily,   - Airway clearance with albuterol nebs, hypertonic saline nebs,  Breo Ellipta 200-31mcg daily (substitute for Symbicort, not on formulary), pulmozyme daily  - Continue homeTrikafta (patient brought home supply)  - Airway clearance with vest  - Continue tobramycin (dosing by pharmacy and zosyn per Pulmonology  -No longer producing sputum, he was unable to provide 1 during this hospitalization.  -Daily labs, monitor renal function  ??  DM: patient uses Omnipod insulin pump with basal continuous infusion rate and bolus dosing that he self-administers. Per [facility] policy patient can continue using home insulin pump as he is alert and oriented and capable of doing so. He brought his own supplies and vials  -Continue home insulin pump per home dosing, no reports of symptomatic hypoglycemia, patient reports good blood sugar control.  -Continue accuchecks per patient.  -high calorie, high protein diet, patient observing some carbohydrate control.    Pancreatic insufficiency  - continue home Creon (8 capsules with meals, 4 capsules with snacks)  ??  Mood disorder  -??continue home fluoxetine 60mg  daily, amitriptyline 100mg  daily, and lamotrigine 400mg  daily.  He seems in good spirits.  ??  History of DVT: Continue home anticoagulation, has history of recurrent VTE, continue to monitor for any hemoptysis.     - continue home Xarelto 20mg  daily    GERD  -Home pantoprazole.  ??  HTN  - Well controlled  ??  Restless leg syndrome  - continue home mirapex 0.25mg  nightly, good symptom control  ??  Chronic back pain  - continue gabapentin 600mg  TID    ___________________________________________________________________    Subjective:  Looking forward to going home in a.m., appetite good.  No complaint of fever, chills.      Labs/Studies:  Labs and Studies from the last 24hrs per EMR and Reviewed    Objective:  Temp:  [35.9 ??C (96.6 ??F)-36.7 ??C (98.1 ??F)] 36.7 ??C (98.1 ??F)  Heart Rate:  [86-110] 110  Resp:  [18-20] 18  BP: (113-131)/(74-81) 130/81  SpO2:  [95 %-96 %] 96 %    General: No acute distress.  HEENT: Mucous membranes moist, no icterus  CV: RRR,  Resp: No adventitial breath sounds, no increased work of breathing, respiratory rate normal.  Abdominal: Soft.  Extremities: No cyanosis, clubbing or edema.  Skin: Warm and dry, no rash  Neuro: Alert, oriented, conversant.    25 minutes, over 50% spent in counseling and coordination of care    Tawni Levy MD

## 2021-09-12 NOTE — Unmapped (Signed)
Pt receiving ABT and raised no complain, no signs of adverse reaction noted. Pt C/o pain and has had a dose of oxy followed with tylenol, pt sleeping at this time. VS WN. Will continue to monitor patient.  Problem: Adult Inpatient Plan of Care  Goal: Plan of Care Review  Outcome: Ongoing - Unchanged  Goal: Patient-Specific Goal (Individualized)  Outcome: Ongoing - Unchanged  Goal: Absence of Hospital-Acquired Illness or Injury  Outcome: Ongoing - Unchanged  Intervention: Identify and Manage Fall Risk  Recent Flowsheet Documentation  Taken 09/12/2021 0000 by Amil Amen, RN  Safety Interventions: fall reduction program maintained  Taken 09/11/2021 2200 by Amil Amen, RN  Safety Interventions: fall reduction program maintained  Taken 09/11/2021 2000 by Amil Amen, RN  Safety Interventions: fall reduction program maintained  Intervention: Prevent and Manage VTE (Venous Thromboembolism) Risk  Recent Flowsheet Documentation  Taken 09/11/2021 2000 by Roetta Sessions Mahreen Schewe, RN  Activity Management: activity adjusted per tolerance  Goal: Optimal Comfort and Wellbeing  Outcome: Ongoing - Unchanged  Goal: Readiness for Transition of Care  Outcome: Ongoing - Unchanged  Goal: Rounds/Family Conference  Outcome: Ongoing - Unchanged     Problem: Infection  Goal: Absence of Infection Signs and Symptoms  Outcome: Ongoing - Unchanged     Problem: Pain Acute  Goal: Acceptable Pain Control and Functional Ability  Outcome: Ongoing - Unchanged     Problem: Fall Injury Risk  Goal: Absence of Fall and Fall-Related Injury  Outcome: Ongoing - Unchanged  Intervention: Promote Injury-Free Environment  Recent Flowsheet Documentation  Taken 09/12/2021 0000 by Amil Amen, RN  Safety Interventions: fall reduction program maintained  Taken 09/11/2021 2200 by Amil Amen, RN  Safety Interventions: fall reduction program maintained  Taken 09/11/2021 2000 by Roetta Sessions Yago Ludvigsen, RN  Safety Interventions: fall reduction program maintained     Problem: Infection (Pneumonia)  Goal: Resolution of Infection Signs and Symptoms  Outcome: Ongoing - Unchanged     Problem: Respiratory Compromise (Pneumonia)  Goal: Effective Oxygenation and Ventilation  Outcome: Ongoing - Unchanged     Problem: Diabetes Comorbidity  Goal: Blood Glucose Level Within Targeted Range  Outcome: Ongoing - Unchanged

## 2021-09-12 NOTE — Unmapped (Signed)
[facility] physician Discharge Summary    Admit date: 09/08/2021    Discharge date and time: 09/12/2021    Discharge to: Home    Discharge Service: Med Undesignated (MDX)    Discharge Attending Physician: Rica Koyanagi, MD    Discharge Diagnoses: Pulmonary exacerbation of cystic fibrosis    Hospital Course:  Admitted 09/09/20    Christian Young is a 36 y.o. male with previous admissions to this institution that presented to [facility] with Cystic fibrosis exacerbation (CMS-HCC) with cough, chills and streaky hemoptysis.  He reported a coughing episode so severe that he passed out.  He contacted his Cystic Fibrosis clinic and was advised to present to the [facility] ER.  As an outpatient, he had received a trial of oral antibiotics without improvement and admission for treatment with intravenous antibiotics was indicated.     Cystic Fibrosis exacerbation  The patient reported he continued to have frequent cough, SOB, cold sweats despite 4 days of oral antibiotics. One episode of hemoptysis on 12/16, none since then.   Initial work-up for viral pathogens including COVID, influenza A, B and RSV were negative.  Pulmonary consultation was obtained recommending intravenous tobramycin and Zosyn for 14 days. He was continued on  Trikafta and his home airway clearance regimen with albuterol nebs, hypertonic saline nebs,  Breo Ellipta 200-25mcg daily (substitute for Symbicort, not on formulary), pulmozyme daily in addition to chest vest. CF sputum culture was ordered but the patient was unable to provide sufficient sputum and  the 1 sample that was submitted was not acceptable for culture.     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  He was continued on a high-calorie high-protein diet but the patient reports he has made some adjustments in his carbohydrate intake with his diabetes.       Pancreatic insufficiency  He was continued on his home Creon (8 capsules with meals, 4 capsules with snacks)     Mood disorder  His mood remained stable and he was in good spirits on his home fluoxetine 60mg  daily, amitriptyline 100mg  daily, and lamotrigine 400mg  daily     History of DVT:   His home Xarelto 20 mg daily was continued, he did not have any recurrent hemoptysis or any abnormal bleeding secondary to anticoagulation        GERD   pantoprazole 20mg  daily     HTN  He has some low normal blood pressures in the ER, home lisinopril was initially held but resumed on discharge as his pressures rebounded with treatment of his CF exacerbation.     RLS  Treated with mirapex 0.25mg  nightly     Chronic back pain   gabapentin 600mg  TID      Condition at Discharge: stable  Discharge Medications:      Your Medication List      STOP taking these medications    ciprofloxacin HCl 500 MG tablet  Commonly known as: CIPRO     ondansetron 4 MG disintegrating tablet  Commonly known as: ZOFRAN-ODT        START taking these medications    piperacillin-tazobactam 4.5 gram/100 mL Pgbk  Commonly known as: ZOSYN  Infuse 100 mL (4.5 g total) into a venous catheter every six (6) hours for 9 days.     tobramycin 460 mg, OVERFILL 10 mL in sodium chloride 0.9 % 100 mL IVPB  Infuse 460 mg into a venous catheter daily for 9 days.  CHANGE how you take these medications    MVW COMPLETE FORMUL PROBIOTIC 40 billion cell -15 mg Cpdr  Generic drug: Lacto-Bif-Sac-Bacil-Strep-bact  Take 1 capsule by mouth daily.  What changed: when to take this        CONTINUE taking these medications    acetaminophen 500 MG tablet  Commonly known as: TYLENOL  Take 1 tablet (500 mg total) by mouth every six (6) hours as needed.     albuterol 90 mcg/actuation inhaler  Commonly known as: PROVENTIL HFA;VENTOLIN HFA  Inhale 2 puffs every six (6) hours as needed.     albuterol 2.5 mg /3 mL (0.083 %) nebulizer solution  Inhale 3 mL (2.5 mg total) by nebulization two (2) times a day.     amitriptyline 100 MG tablet  Commonly known as: ELAVIL  TAKE ONE TABLET BY MOUTH AT BEDTIME     atorvastatin 20 MG tablet  Commonly known as: LIPITOR  Take 40 mg by mouth nightly.     blood-glucose meter kit  Dispense meter that is preferred by patient's insurance company     budesonide-formoteroL 160-4.5 mcg/actuation inhaler  Commonly known as: SYMBICORT  Inhale 2 puffs Two (2) times a day.     cetirizine 10 MG tablet  Commonly known as: ZyrTEC  Take 1 tablet (10 mg total) by mouth daily.     DEXCOM G6 RECEIVER Misc  Generic drug: blood-glucose meter,continuous  Use as directed     DEXCOM G6 SENSOR Devi  Generic drug: blood-glucose sensor  Use sq as directed every 10 days     DEXCOM G6 TRANSMITTER Devi  Generic drug: blood-glucose transmitter  Use as directed, replace every 90 days     dornase alfa 1 mg/mL nebulizer solution  Commonly known as: PULMOZYME  Inhale 2.5 mg daily.     EASY TOUCH LANCING DEVICE Misc  Generic drug: lancing device  Use as directed.     EASY TOUCH TWIST LANCETS 30 gauge Misc  Generic drug: lancets  Use as directed.     FLUoxetine 60 mg Tab  Take 60 mg by mouth daily.     fluticasone propionate 50 mcg/actuation nasal spray  Commonly known as: FLONASE  2 sprays into each nostril daily.     FREESTYLE LIBRE 14 DAY SENSOR kit  Generic drug: flash glucose sensor  Use 3-4 times daily     gabapentin 600 MG tablet  Commonly known as: NEURONTIN  Take 600 mg by mouth Three (3) times a day.     glucose blood Strp  Generic drug: blood sugar diagnostic  by Other route Three (3) times a day with a meal. Rx sent to [facility] 01/04/20     insulin ASPART 100 unit/mL injection  Commonly known as: NovoLOG U-100 Insulin aspart  Subcutaneously infuse up to 150 units daily via insulin pump     lamoTRIgine 200 MG tablet  Commonly known as: LaMICtal  Take 400 mg by mouth daily.     LC PLUS Misc  Generic drug: nebulizers  Use as directed with inhaled medications     lipase-protease-amylase 36,000-114,000- 180,000 unit Cpdr  Commonly known as: CREON  Take 8 caps by mouth with meals three times daily and 4 caps with snacks up to three times a day.     lisinopriL 10 MG tablet  Commonly known as: PRINIVIL,ZESTRIL  Take 1 tablet (10 mg total) by mouth daily.     melatonin 10 mg Tab  Take 10 mg by mouth nightly as  needed.     montelukast 10 mg tablet  Commonly known as: SINGULAIR  Take 1 tablet (10 mg total) by mouth nightly.     MVW Complete (pediatric multivit 61-D3-vit K) 1,500-800 unit-mcg Cap  Commonly known as: MVW COMPLETE FORMULATION  Take 2 capsules by mouth Two (2) times a day.     omeprazole 20 MG capsule  Commonly known as: PriLOSEC  Take 1 capsule (20 mg total) by mouth in the morning.     OMNIPOD 5 G6 PODS (GEN 5) Crtg  Generic drug: insulin pump cart,automated,BT  1 each by subcutaneous (via wearable injector) route every three (3) days. Change every 72 hour     pen needle, diabetic 32 gauge x 5/32 (4 mm) Ndle  Commonly known as: BD ULTRA-FINE NANO PEN NEEDLE  use up to 4 times daily     pramipexole 0.125 MG tablet  Commonly known as: MIRAPEX  Take 0.25 mg by mouth nightly.     sodium chloride 7% 7 % Nebu  Inhale 4 mL by nebulization two (2) times a day.     traZODone 150 MG tablet  Commonly known as: DESYREL  Take 150 mg by mouth nightly.     TRIKAFTA 100-50-75 mg(d) /150 mg (n) tablet  Generic drug: elexacaftor-tezacaftor-ivacaft  Take 2 Tablets (orange) by mouth in the morning and 1 tablet (blue) in the evening with fatty food     XARELTO 20 mg tablet  Generic drug: rivaroxaban  Take 20 mg by mouth daily.          Pending Labs     Order Current Status    Basic Metabolic Panel In process    Magnesium Level In process        Discharge Instructions:     Follow Up instructions and Outpatient Referrals     Referral to Home Infusion      Performing location?: External    Home Health Requested Disciplines: Nursing    Do you want ongoing co-management?: No    Care coordination required?: No     **Please contact your service pharmacist for assistance with discharge   home health infusion monitoring.        Appointments which have been scheduled for you    Oct 09, 2021  1:40 PM  (Arrive by 1:25 PM)  RETURN  DIABETES with Harrie Foreman, MD  [facility] DIABETES AND ENDOCRINOLOGY [place] [facility]) [address]  [place] [address]  [phone]      Oct 09, 2021  2:30 PM  (Arrive by 2:15 PM)  XR DEXA BONE DENSITY SKELETAL with [place] DEXA RM 1  IMG DEXA [place] [facility] [place]) [address]  [place] [address]  [phone]   No calcium supplements 24 hrs prior.     Oct 16, 2021 12:40 PM  ADD ON AUDIO with SPCAUD AUDIOLOGIST  [facility] AUDIOLOGY [place] [facility] REGION) [address]  [address]  [place] [address]  [phone]      Oct 16, 2021  1:00 PM  NEW ENT with Otelia Santee, FNP  [facility] OTOLARYNGOLOGY [place] [facility] REGION) [address]  [address]  [place] [address]  [phone]              I spent greater than 30 minutes in the discharge of this patient.  Tawni Levy MD

## 2021-09-12 NOTE — Unmapped (Addendum)
Primary Team: Please copy and paste below information into home infusion referral:     Disciplines requested: Nursing and Home IV Antibiotics    Physician to follow patient's care (the person listed here will be responsible for signing ongoing orders): Cleotis Lema) Christian Muscat, MD  Outpatient Pharmacist to follow IV antibiotic therapy: CF Patients (Last Names A-K): Christian Young, CPP    Inpatient Start Date of Antibiotics (Day 1): 09/09/21  Start of Care Date (discharge date): 09/12/21  Estimate completion of therapy: 09/22/21  Home health/infusion please reach out to nurse contact (below) 2-3 days prior to end of therapy for appropriate orders  IV Access: Port: Adult Port: Please change Christian Young needle/ dressing weekly/prn. Flush with of Normal Saline before and after each use. Use after blood draws. Flush with Heparin 100units/mL 5 mLs at the end of each use and prn line maintenance. Please change needleless cap weekly and after blood draws, using aseptic technique.   Special Central line considerations: Please use chlorhexidine gluconate-impregnated patch for the first 30 days post insertion.  ??  Flush Orders:  Port-a-Cath Adult Patient Port-a-Cath Adult Patient:   Please change Huber needle/ dressing weekly/prn. Flush with of Normal Saline before and after each use and prn line maintenance. Use after blood draws.   Flush with Heparin 100units/mL 5 mLs at the end of each use and prn line maintenance.  Please change needleless cap weekly and after blood draws, using aseptic technique.   Please access and flush unused Port with Heparin 100units/mL 5 mLs monthly.  If dual lumen Port, flush both sides.  (Allergies/Dressing preferences)      IV ANTIBIOTIC ORDERS:  ??? Tobramycin 460 mg IV every 24 hours (CF hi-dose, infuse over 60 minutes)-Please dose in the MORNING    **DO NOT DRAW DRUG LEVELS FROM PORT**    LAB MONITORING ORDERS:  Start labs on 09/14/21  ??? [Tobramycin (Hi-Dose Ext Interval) + Ceftazidime/Cefepime/Piperacillin-tazobactam/Ceftaroline]: On Mondays each week: CBC + diff, BMP, Magnesium, Tobramycin Random Level (collect 7 hours after END of infusion time); On Thursdays each week: BMP, Magnesium, Tobramycin Peak Level (collect 1 hour after END of infusion time)    Collect on MONDAYS of each week:  ??? CBC + diff  ??? Magnesium  ??? Tobramycin Random Level (collect 7-8 hrs after END of infusion time)    Collect on THURSDAYS of each week (only for patients on aminoglycosides)  ??? Basic Metabolic Panel  ??? Tobramycin Peak Level (collect 1-1.5 hrs after END of infusion time)    TARGET DRUG LEVELS:  ??? -Tobramycin (CF Hi-dose Extended Interval): Target end of Infusion peak: 20-30 mcg/mL; trough < 37mcg/mL; 12-hr random level < 2 mcg/mL    --------------------------------------------------------------------------------------  *PLEASE FAX RESULTS TO: [phone]    *PHONE CONTACT*:  ??? Adult CF Patients (Last name A to K): Nurse Coordinator-Christian Bingham, RN [phone]  ??? For critical lab results or urgent matters outside of regular business hours (Mon through Fri 8:00AM-4:30PM, please call the hospital operator at [phone] and ask to page the pulmonary fellow on-call.    **Please warm hand-off to outpatient team nurse coordinator, pharmacist, and physician per contacts listed above**

## 2021-09-13 NOTE — Unmapped (Addendum)
Christian Young was discharged from [facility] on Tuesday, January 31st with a plan of home IVs for 14 days.  ??  Inpatient Start Date of Antibiotics (Day 1): 09/09/21   Estimate completion of therapy: (Day 14): 09/22/21   ??  Home Infusion Agency: [facility] -- phone: [phone] Fax: [phone]  Home Health Agency: [facility] to provide [facility] RN services SOC 2/1 phone: [phone]    Nurse:   ??  CENTRAL LINE:  Port-A-Cath   ??  IV ANTIBIOTIC ORDERS:  ??? Tobramycin??460mg  IV every 24 hours (CF hi-dose, infuse over 60 minutes)--dosing in the afternoon 1300  ??? Piperacillin-tazobactam (Zosyn) 4.5g IV every 6 hours  (6/07/19/11)   ??  LABS:  ??? On Mondays each week:   ? CBC + diff,   ? BMP   ? Magnesium  ? Tobramycin Random Level (collect 7 hours after END of infusion time)  ??  ??? On Thursdays each week:   ? BMP  ? Magnesium  ? Tobramycin Peak Level (collect 1 hour after END of infusion time)    PLAN with PATIENT:  ?? Wife re-accessed Christian Young's port once they were at home   ?? Had a visit this morning by [facility]--believes someone is coming tomorrow  ?? Reviewed time of lab collection and request of pushing back the start time to earlier in the morning for his tobramycin infusion   ?? MyChart message sent to [facility] with the next two sets of labs we anticipate collecting.     Reviewed with Home Health:  ?? Labs requested on Thursday (or Friday if trouble securing a nurse)    Christian Young. [name], RN  CF Nurse Coordinator   [phone]

## 2021-09-15 MED ORDER — CIPROFLOXACIN 750 MG TABLET
ORAL_TABLET | Freq: Two times a day (BID) | ORAL | 0 refills | 7 days | Status: CP
Start: 2021-09-15 — End: 2021-09-22

## 2021-09-15 NOTE — Unmapped (Addendum)
09/15/21 3:05 PM  Discussed with Dr [name] [name]'s complaints of burning, itching, swelling of his port site with infusion of tobramycin. He also noted to have intense tinnitus with his tobramycin infusion. Dr [name] would like Christian Young to take ciprofloxacin 750mg  BID x 7 days while he finishes his course of IV zoysn. This was taken as a verbal order as Dr [name] was needing to urgently attend to an inpatient, prescription e-scribed to the [facility] local pharmacy.     Spoke with [facility] pharmacist, Casimiro Needle, and informed him that we are discontinuing the tobramycin IV, in favor of PO ciprofloxacin. Continuing with the zosyn through 2/10.     Spoke with Christian Young about the above plan. He notes he is feeling a lot better since last week. He will continue with the zosyn and the cipro until next week. Asked him to keep the team updated on how his port functions and feels.     5:02 PM  Spoke with Sharia Reeve, care coordinator with [facility]. He is aware that we have asked to not collect the tobramycin level (random) on Monday as we have discontinued the medication. He also wanted to inform the team that the tobramycin peak returned via [facility] with a results of 12.3.     [name], RN  CF Nurse Coordinator   [phone]

## 2021-09-15 NOTE — Unmapped (Signed)
Outpatient Lab Results from 09/15/21    Outpatient IV Antimicrobial Therapy:  Start Date: 09/09/21  End Date: 09/22/21  Line: PORT  Referring pulmonologist: Cleotis Lema) [name], MD    IV Antibiotics Ordered:  ??? Tobramycin 460mg  IV every 24 hours (CF hi-dose, infuse over 60 minutes)-Please dose in the MORNING  ??? Piperacillin-tazobactam (Zosyn) 4.5g IV every 6 hours        09/15/21  Patient was called to discuss start and stop time of Tobramycin on date of drug level collection. Confirmed with patient dosing times of antibiotics and approximate time home health nursing was present to collect labs.     **of note, he did not infuse his tobramycin on 09/14/21**    Patient Reported Administration Time:   Start of infusion: 1130   End of infusion: 1230    Lab draw time: 1330     Reviewed with patient:  ??? Urine output: Normal  ??? Flank pain: Denies  ??? Tinnitus/changes in hearing or having imbalance: Yes--an hour after he infuses the tobramycin, horrendous ringing 10-15 minutes will end --noted it in the hospital as well   ??? Rash: rash around port   ??? Nausea/Vomiting/Diarrhea: Denies  ??? Any new medications started: none   ??? OTC NSAID use: Tylenol (takes edge off, but doesn't help with the port discomfort/burning) Benadryl.     PLAN     [name] left a message with this Financial planner. He is reporting problems with his tobramycin infusion. He is having a strong reaction to the tobramycin, yesterday. He reports it caused his port site to swell, his port to hurt during his infusion. He reports he developed a skin rash and broke out in hives. He asking if he should stop the tobramycin infusions or find an alternative mediation.     Reached out to [place] to discuss further.  When speaking with his wife, [name], they had initially thought that it was the dressing covering his port that was causing his complaints. However, after letting it breathe for a day and using an alternative dressing (IV3000) it helped some, but again Leanthony noted to having a bad burning and itching sensation with infusing of Tobramycin today.     Reaction not noted with zosyn. Noted similar reaction in the hospital, but wasn't as severe (and didn't report it to the team).  When he infuses the tobramycin, the whole port swells and notes a burning sensation.     1. Note forwarded to: Cleotis Lema) [name], MD  2. Lab results forwarded to CPP Leanord Hawking, RN

## 2021-09-15 NOTE — Unmapped (Signed)
Addended by: Cicero Duck on: 09/15/2021 03:27 PM     Modules accepted: Orders

## 2021-09-15 NOTE — Unmapped (Signed)
Adult Cystic Fibrosis Clinic Pharmacist Note   Purpose: Outpatient Home IV Antibiotic Lab Monitoring. Results from 09/15/21, BMP resulted 09/18/21    Outpatient IV Antimicrobial Therapy:  Start Date: 09/08/21 (zosyn) 09/09/21 (tobra)  End Date: 09/22/21  Line: PORT  Referring pulmonologist: Cleotis Lema) Despotes, MD    IV Antibiotics Ordered:  ??? Tobramycin 460mg  IV every 24 hours   ??? Piperacillin-tazobactam (Zosyn) 4.5g IV every 6 hours    Goal Drug Levels  ?? Tobramycin (CF Hi-dose Extended Interval): End of Infusion peak: 20-30 mcg/mL; trough < 83mcg/mL; 12-hr random level < 2 mcg/mL     GENERAL LABS:  Recent Labs   Lab Units 09/12/21  0631   WBC 10*9/L 4.7   HEMOGLOBIN g/dL 16.1*   HEMATOCRIT % 09.6*   PLATELET COUNT (1) 10*9/L 238   NEUTRO ABS 10*9/L 2.5   LYMPHO ABS 10*9/L 1.2   EOSINO ABS 10*9/L 0.3   BASOS ABS 10*9/L 0.1     Recent Labs   Lab Units 09/15/21  1325 09/12/21  0720   BUN mg/dL 15 10   CREATININE mg/dL 0.45 4.09   POTASSIUM mmol/L 4.6 4.3   MAGNESIUM mg/dL  --  1.6     No results in the last week  Collected Drug Levels     Date Infusion   Time Lab   Time Drug Levels   09/12/21 8119-1478 1213 tobramycin random: 2.1 mcg/mL. drawn 7.25 hour(s) after end of infusion time     09/15/21 1130-1230 1330 tobramycin peak: 12.3 mcg/mL. drawn 1 hour(s) after end of infusion time     Wt Readings from Last 3 Encounters:   09/09/21 (!) 110.8 kg (244 lb 4.3 oz)   07/30/21 (!) 111.1 kg (245 lb)   06/08/21 (!) 113.4 kg (250 lb)        Patient-specific Pharmacokinetics:    Date   Dose Extrapolated levels (mcg/mL)   Vd (L)   L/kg   Ke (hr-1)     Peak Trough      09/15/21 460 mg Q 24H  16.32 0.02 24.6 0.27 0.283   * calculated with adjusted body weight    Recommendations at this time unless clinically indicated otherwise:  ??? Current levels are subtherapeutic for a goal peak of 20-30. While inpatient, patient had been on a lower dose (280mg ) every 12 hours given history of tinnitus. Converted to daily dosing due to accumulation. Patient has since reported horrendous ringing in ears 1 hour after he infuses tobramycin.  ??? Creatinine is improved.   ??? Na very slightly below normal limits. Previously WNL, will monitor. Otherwise general labs are WNL.   ??? Given significant tinnitus and in discussion with the provider will DISCONTINUE Tobramycin 460mg  IV every 24 hours and start ciprofloxacin 750mg  PO BID through 09/22/21  ??? Continue Piperacillin-tazobactam (Zosyn) 4.5g IV every 6 hours  ???  If tobramycin is needed in the future, would consider 24 hour dosing (elevated trough inpatient with Q12H dosing) with lower peak.    Next Labs:   Due on 09/18/21  ??? CBC + diff  ??? Comprehensive Metabolic Panel     Due on 09/21/21 if needed  ??? CBC + diff  ??? Comprehensive Metabolic Panel        Electronically signed:  Alben Spittle, PharmD, BCACP, CPP  Clinical Pharmacist Practitioner  [facility]  [phone]      Consult note routed back to:   ??? Cleotis Lema) [name], MD  ??? Norlene Duel, CF  Nurse Coordinator

## 2021-09-16 NOTE — Unmapped (Signed)
Received page from home health company regarding critical lab. Returned the page which then required another subsequent return phone call. Tobramycin peak is 12.3. Patient's CF team has already discontinued tobramycin and plans for random level check next week. No further action required. Routed to patient's CF MD Dr. [name].

## 2021-09-18 LAB — BASIC METABOLIC PANEL
BLOOD UREA NITROGEN: 15 mg/dL
CALCIUM: 9.1 mg/dL
CHLORIDE: 101 mmol/L
CO2: 28 mmol/L
CREATININE: 0.7 mg/dL
GLUCOSE RANDOM: 134 mg/dL — ABNORMAL HIGH (ref 70–99)
POTASSIUM: 4.6 mmol/L
SODIUM: 136 mmol/L — ABNORMAL LOW (ref 137–146)

## 2021-09-18 LAB — TOBRAMYCIN LEVEL, PEAK: TOBRAMYCIN PEAK: 12.3 ug/mL

## 2021-09-18 NOTE — Unmapped (Signed)
Note made in error, please disregard.

## 2021-09-18 NOTE — Unmapped (Signed)
Nutrition - Brief: Lipid Panel    Christian Young's lipid panel was drawn at outside facility; results available in media tab. Findings significant for elevated triglycerides and LDL, low HDL. A1c also elevated at 11.6.    High triglyceride nutrition therapy and LDL cholesterol lowering nutrition therapy handouts sent via MyChart. Encouraged Christian Young to reach out via MyChart/phone with questions.    Jackqulyn Livings MPH, RD, LDN  Pager: [phone]

## 2021-09-18 NOTE — Unmapped (Signed)
[place] [facility]    Home Spirometry Monitoring      09/18/21        I have reviewed 1 home spirometry test(s) over prior calendar month.  Most recent test demonstrates moderate obstruction and mild restriction.  Compared to prior home spirometry values, the most recent test is better. Best in-clinic FEV1 in last year was 3.42 L (73%) on 12/01/20.         Quality of most recent test graded A.     Follow-up plan:   ??? Reached out to patient via MyChart. He is currently close to finishing a course of home IV antibiotics.   ??? Recommendations: Repeat home spirometry at end of IV course.   ??? If at baseline, continue monthly home spirometry monitoring for the management of cystic fibrosis.    Durenda Hurt [place], PA-C  [facility]   [phone]

## 2021-09-18 NOTE — Unmapped (Signed)
Outpatient Lab Results from 09/18/21-- to be collected 09/19/21     Outpatient IV Antimicrobial Therapy:  Start Date: 09/09/21  End Date: 09/22/21  Line: PORT  Referring pulmonologist: Christian Lema) Despotes, MD    IV Antibiotics:  ??? Tobramycin 460mg  IV every 24 hours (CF hi-dose, infuse over 60 minutes)-1/28-2/3 (omitted dose 2/2)   ??? Piperacillin-tazobactam (Zosyn) 4.5g IV every 6 hours    Oral Antibiotics:   ?? Ciprofloxacin 750mg  every 12 hours (2/4-2/10)        09/18/21   Reviewed with patient:  ??? Urine output: Normal  ??? Flank pain: Denies  ??? Tinnitus/changes in hearing or having imbalance: Yes-- has slowed down a bit, not as often though notes it to be more than his baseline   ??? Rash: rash around port --a lot less inflamed--  ??? Nausea/Vomiting/Diarrhea: Denies  ??? Any new medications started: none   ??? OTC NSAID use: none    PLAN     Spoke with Christian Young, who reports he is  doing alright. Port site is better, still has a rash, but a lot less inflamed, wife Christian Young, things it is improved.   Nursing called today and nursing will be out on Tuesday for labs.     1. Note forwarded to: Christian Lema) Christian Muscat, MD  2. Lab results forwarded to CPP Christian Hawking, RN

## 2021-09-20 NOTE — Unmapped (Signed)
Entered in error

## 2021-09-21 LAB — CBC W/ DIFFERENTIAL
BASOPHILS ABSOLUTE COUNT: 0.1 10*9/L
BASOPHILS RELATIVE PERCENT: 1 %
EOSINOPHILS ABSOLUTE COUNT: 0.5 10*9/L — ABNORMAL HIGH (ref 0.0–0.4)
EOSINOPHILS RELATIVE PERCENT: 8 %
HEMATOCRIT: 42.3 %
HEMOGLOBIN: 14 g/dL
LYMPHOCYTES ABSOLUTE COUNT: 1.5 10*9/L
LYMPHOCYTES RELATIVE PERCENT: 23 %
MEAN CORPUSCULAR HEMOGLOBIN CONC: 33.1 g/dL
MEAN CORPUSCULAR HEMOGLOBIN: 26.9 pg
MEAN CORPUSCULAR VOLUME: 81 fL
MONOCYTES ABSOLUTE COUNT: 0.6 10*9/L
MONOCYTES RELATIVE PERCENT: 10 %
NEUTROPHILS ABSOLUTE COUNT: 3.7 10*9/L
NEUTROPHILS RELATIVE PERCENT: 58 %
PLATELET COUNT: 300 10*9/L
RED BLOOD CELL COUNT: 5.2 10*12/L
RED CELL DISTRIBUTION WIDTH: 14.8 %
WHITE BLOOD CELL COUNT: 6.4 10*9/L

## 2021-09-22 LAB — MAGNESIUM: MAGNESIUM: 1.9 mg/dL

## 2021-09-22 LAB — BASIC METABOLIC PANEL
BLOOD UREA NITROGEN: 12 mg/dL
CALCIUM: 8.5 mg/dL — ABNORMAL LOW (ref 8.7–10.2)
CHLORIDE: 94 mmol/L — ABNORMAL LOW (ref 96–106)
CO2: 14 mmol/L — ABNORMAL LOW (ref 20.0–29.0)
CREATININE: 0.84 mg/dL
GLUCOSE RANDOM: 250 mg/dL — ABNORMAL HIGH (ref 70–99)
POTASSIUM: 4.5 mmol/L
SODIUM: 137 mmol/L

## 2021-09-22 NOTE — Unmapped (Addendum)
Outpatient Lab Results collected 09/19/21 (resulted 2/10)--see Media Tab      Outpatient IV Antimicrobial Therapy:  Start Date: 09/09/21  End Date: 09/22/21  Line: PORT  Referring pulmonologist: Cleotis Lema) [name], MD    IV Antibiotics:  ??? Tobramycin 460mg  IV every 24 hours (CF hi-dose, infuse over 60 minutes)-1/28-2/3 (omitted dose 2/2) 09/09/21-09/15/21  ??? Piperacillin-tazobactam (Zosyn) 4.5g IV every 6 hours    Oral Antibiotics:   ?? Ciprofloxacin 750mg  every 12 hours (2/4-2/10)        09/22/21   Reviewed with patient:  ??? Urine output: Normal  ??? Flank pain: Denies  ??? Tinnitus/changes in hearing or having imbalance: Yes-- has slowed down a bit, not as often though notes it to be more than his baseline   ??? Rash: rash around port --a lot less inflamed--  ??? Nausea/Vomiting/Diarrhea: Denies  ??? Any new medications started: none   ??? OTC NSAID use: none    PLAN     09/22/21 10:40 AM  Lab received from [facility] and uploaded to media. Left message with [name] requesting a return call.     3:24 PM  Spoke with [name]. Denies nausea, vomiting, diarrhea. He feels overall back to baseline, very infrequent cough and sputum is back to its normal color/consistency. Has one more dose of Zosyn for 1800 and then he will have his wife de-access his port.     Requested Malyk to use his home spirometer.     Will follow-up with [facility] to inform them that Elyas is done with therapy as scheduled.       1. Note forwarded to: Cleotis Lema) [name], MD  2. Lab results forwarded to CPP Leanord Hawking, RN

## 2021-09-22 NOTE — Unmapped (Signed)
[facility] Pharmacist Note   Purpose: Outpatient Home IV Antibiotic Lab Monitoring. Results from 09/19/21, BMP resulted 09/22/21    Outpatient IV Antimicrobial Therapy:  Start Date: 09/08/21 (zosyn) 09/09/21 (tobra)  End Date: 09/22/21  Line: PORT  Referring pulmonologist: Cleotis Lema) Despotes, MD    IV Antibiotics Ordered:  ??? Piperacillin-tazobactam (Zosyn) 4.5g IV every 6 hours  ??? Tobramycin 460mg  IV every 24 hours- discontinued 09/15/21    PO Antibiotics Ordered:  ?? Ciprofloxacin 750mg  PO every 12 hours      GENERAL LABS:  Recent Labs   Lab Units 09/19/21  1230   WBC 10*9/L 6.4   HEMOGLOBIN g/dL 03.4   HEMATOCRIT % 74.2   PLATELET COUNT (1) 10*9/L 300   NEUTRO ABS 10*9/L 3.7   LYMPHO ABS 10*9/L 1.5   EOSINO ABS 10*9/L 0.5*   BASOS ABS 10*9/L 0.1     Recent Labs   Lab Units 09/15/21  1325   BUN mg/dL 15   CREATININE mg/dL 5.95   POTASSIUM mmol/L 4.6     No results in the last week        Recommendations at this time unless clinically indicated otherwise:  ??? Creatinine is increased.   ??? Na now WNL. Cl and CO2 abnormal, below normal limits. Pt denies N/V, creatinine increased but not dramatically. Glucose is elevated but DKA seems unlikely. Suspect hyperventilation. Otherwise general labs are WNL.   ??? Finish Piperacillin-tazobactam (Zosyn) 4.5g IV every 6 hours today       Electronically signed:  Alben Spittle, PharmD, BCACP, CPP  Clinical Pharmacist Practitioner  [facility]  [phone]      Consult note routed back to:   ??? Cleotis Lema) [name], MD  ??? Norlene Duel, CF Nurse Coordinator

## 2021-09-27 DIAGNOSIS — E089 Diabetes mellitus due to underlying condition without complications: Principal | ICD-10-CM

## 2021-09-27 NOTE — Unmapped (Signed)
Reached out to Paisley, he is visiting his brother in [place], [place] at present. He is coming into town early next week. He is agreeable to getting is labs drawn at [facility] next Tuesday or Wednesday.  Order for BMP through [facility] placed.     Will route update to the team as an FYI.    [name], RN  CF Nurse Coordinator   [phone]

## 2021-10-05 NOTE — Unmapped (Unsigned)
Christian Young is seen in consultation at the request of Dr. [name] for the evaluation of hearing loss.    Dear Dr. [name],    Thank you for referring Christian Young; as you know he is a 36 y.o. male who presents for the evaluation of hearing loss. ***    Past Medical History  He  has a past medical history of Anxiety, Chronic pain disorder, Cystic fibrosis (CMS-HCC), Depression, Hypertension, Lumbar radiculopathy (10/26/2020), and Nonproductive cough (04/05/2018).    Past Surgical History  His  has a past surgical history that includes pr removal of lung,lobectomy (Right, 03/29/2017) and IR Insert Port Age Greater Than 5 Years (03/27/2019).    Past Family History  His family history includes Bipolar disorder in his mother; Depression in his mother.    Past Social History  He  reports that he has quit smoking. His smoking use included e-cigarettes. He has never used smokeless tobacco. He reports that he does not currently use alcohol after a past usage of about 3.0 standard drinks per week. He reports that he does not use drugs.    Medications/Allergies  His current medication(s) include:    Current Outpatient Medications:   ???  acetaminophen (TYLENOL) 500 MG tablet, Take 1 tablet (500 mg total) by mouth every six (6) hours as needed., Disp: 30 tablet, Rfl: 0  ???  albuterol (PROVENTIL HFA;VENTOLIN HFA) 90 mcg/actuation inhaler, Inhale 2 puffs every six (6) hours as needed., Disp: 1 Inhaler, Rfl: 1  ???  albuterol 2.5 mg /3 mL (0.083 %) nebulizer solution, Inhale 3 mL (2.5 mg total) by nebulization two (2) times a day., Disp: 540 mL, Rfl: 3  ???  amitriptyline (ELAVIL) 100 MG tablet, TAKE ONE TABLET BY MOUTH AT BEDTIME (Patient taking differently: Take 100 mg by mouth nightly.), Disp: 30 tablet, Rfl: 1  ???  atorvastatin (LIPITOR) 20 MG tablet, Take 40 mg by mouth nightly., Disp: , Rfl:   ???  blood sugar diagnostic (GLUCOSE BLOOD) Strp, by Other route Three (3) times a day with a meal. Rx sent to [facility] 01/04/20, Disp: , Rfl:   ???  blood-glucose meter kit, Dispense meter that is preferred by [employer], Disp: 1 each, Rfl: 0  ???  blood-glucose meter,continuous (DEXCOM G6 RECEIVER) Misc, Use as directed, Disp: 1 each, Rfl: 0  ???  blood-glucose sensor (DEXCOM G6 SENSOR) Devi, Use sq as directed every 10 days, Disp: 9 each, Rfl: 11  ???  blood-glucose transmitter (DEXCOM G6 TRANSMITTER) Devi, Use as directed, replace every 90 days, Disp: 1 each, Rfl: 11  ???  budesonide-formoteroL (SYMBICORT) 160-4.5 mcg/actuation inhaler, Inhale 2 puffs Two (2) times a day., Disp: 30.6 g, Rfl: 3  ???  cetirizine (ZYRTEC) 10 MG tablet, Take 1 tablet (10 mg total) by mouth daily., Disp: 30 tablet, Rfl: 11  ???  dornase alfa (PULMOZYME) 1 mg/mL nebulizer solution, Inhale 2.5 mg daily., Disp: 450 mL, Rfl: 3  ???  EASY TOUCH LANCING DEVICE Misc, Use as directed., Disp: , Rfl:   ???  EASY TOUCH TWIST LANCETS 30 gauge Misc, Use as directed., Disp: , Rfl:   ???  elexacaftor-tezacaftor-ivacaft (TRIKAFTA) 100-50-75 mg(d) /150 mg (n) tablet, Take 2 Tablets (orange) by mouth in the morning and 1 tablet (blue) in the evening with fatty food, Disp: 84 tablet, Rfl: 6  ???  FLUoxetine 60 mg Tab, Take 60 mg by mouth daily. , Disp: , Rfl:   ???  fluticasone propionate (FLONASE) 50 mcg/actuation nasal spray, 2  sprays into each nostril daily., Disp: , Rfl:   ???  FREESTYLE LIBRE 14 DAY SENSOR kit, Use 3-4 times daily, Disp: , Rfl:   ???  gabapentin (NEURONTIN) 600 MG tablet, Take 600 mg by mouth Three (3) times a day., Disp: , Rfl:   ???  insulin ASPART (NOVOLOG U-100 INSULIN ASPART) 100 unit/mL injection, Subcutaneously infuse up to 150 units daily via insulin pump, Disp: 150 mL, Rfl: 11  ???  insulin pump cart,automated,BT (OMNIPOD 5 G6 PODS, GEN 5,) Crtg, 1 each by subcutaneous (via wearable injector) route every three (3) days. Change every 72 hour, Disp: 10 each, Rfl: 12  ???  lamoTRIgine (LAMICTAL) 200 MG tablet, Take 400 mg by mouth daily., Disp: , Rfl:   ??? lipase-protease-amylase (CREON) 36,000-114,000- 180,000 unit CpDR, Take 8 caps by mouth with meals three times daily and 4 caps with snacks up to three times a day., Disp: , Rfl:   ???  lisinopriL (PRINIVIL,ZESTRIL) 10 MG tablet, Take 1 tablet (10 mg total) by mouth daily., Disp: 30 tablet, Rfl: 0  ???  melatonin 10 mg Tab, Take 10 mg by mouth nightly as needed., Disp: , Rfl:   ???  montelukast (SINGULAIR) 10 mg tablet, Take 1 tablet (10 mg total) by mouth nightly., Disp: 90 tablet, Rfl: 3  ???  MVW COMPLETE FORMUL PROBIOTIC 40 billion cell -15 mg CpDR, Take 1 capsule by mouth daily. (Patient taking differently: Take 1 capsule by mouth daily.), Disp: 90 capsule, Rfl: 3  ???  MVW Complete, pediatric multivit 61-D3-vit K, (MVW COMPLETE FORMULATION) 1,500-800 unit-mcg cap, Take 2 capsules by mouth Two (2) times a day., Disp: , Rfl:   ???  nebulizers Misc, Use as directed with inhaled medications, Disp: 4 each, Rfl: 3  ???  omeprazole (PRILOSEC) 20 MG capsule, Take 1 capsule (20 mg total) by mouth in the morning., Disp: 90 capsule, Rfl: 3  ???  pen needle, diabetic (BD ULTRA-FINE NANO PEN NEEDLE) 32 gauge x 5/32 (4 mm) Ndle, use up to 4 times daily, Disp: 400 each, Rfl: 12  ???  pramipexole (MIRAPEX) 0.125 MG tablet, Take 0.25 mg by mouth nightly., Disp: , Rfl:   ???  sodium chloride 7% 7 % Nebu, Inhale 4 mL by nebulization two (2) times a day., Disp: , Rfl:   ???  traZODone (DESYREL) 150 MG tablet, Take 150 mg by mouth nightly., Disp: , Rfl:   ???  XARELTO 20 mg tablet, Take 20 mg by mouth daily., Disp: , Rfl:   Allergies: Aztreonam, [place] [aztreonam lysine], Cefepime, Other, Slo-bid 100, Tobramycin, and Banana,    Review of systems   Review of systems was reviewed on attached notes/patient intake forms.      Physical Examination  There were no vitals taken for this visit.    General: Well appearing, stated age, no distress   Communicates age appropriate, responds to speech well  Head - Atraumatic, normocephalic   Face - No surface abnormalities, no tenderness over sinuses   Facial Strength - HB: Right ***/6; Left ***/6  Eyes - No conjunctivitis, no scleritis/iritis, EOM full   Nose - External nose without deformity, anterior rhinoscopy - turbinates, mucosa normal   Oral Cavity/Oropharynx/Lips:  Normal mucous membranes, normal floor of mouth/tongue/OP, no masses or lesions are noted, dentition ***.  Salivary Glands - No mass or asymmetry, no tenderness   Neck - No palpable masses/adenopathy, no thyromegaly   Lymphatic - No neck lymphadenopathy, no lymphedema   Cardiovascular - Regular heart rate,  no clubbing/cyanosis/edema in hands  Respiratory - Breathing comfortably, no audible wheezing or stridor  Psychiatric- Appropriate mood and affect  Neurologic - Cranial nerves 2-12 intact, patient oriented x3    Ears - External ear- normal, no lesions, no malformations   Otoscopy - ***    Tuning fork test -   Weber {Weber:54416}  Rinne Left {Rinne:54415}, Right {Rinne:54415}    Procedures  ***    Audiogram  The audiogram was personally reviewed and interpreted. This demonstrates a ***    ***    Tympanogram  The tympanogram was personally reviewed and interpreted. This demonstrates ***    Imaging  I personally reviewed and interpreted the patients imaging studies.     CT Head 06/08/21: clear mastoids bilaterally, negative for middle ear pathology.     I reviewed outside medical records scanned into media. The pertinent findings are summarized in the HPI.     Assessment and Plan  The patient is a 36 y.o. male with ***. ***    The patient/family voiced understanding of the plan as detailed above, and is in agreement. I appreciate the opportunity to participate in his care.    This record has been created using voice recognition software and may have errors that were not dictated and not seen in editing.

## 2021-10-07 NOTE — Unmapped (Incomplete)
It was a pleasure seeing you today at our [facility].

## 2021-10-07 NOTE — Unmapped (Unsigned)
Chief Complaint:     36 y.o. male with a PMH of CF, HTN w CFRD.   No chief complaint on file.    Subjective:     HPI   CFRD dx'd ~2/19.       Last seen by myself 03/09/2021  At last visit:   ??? Blood sugars still a little high over night might try more Basaglar 55 or 60 unis  ??? Coverage for dinner w Novolog and prebolus  ??? Urine albumin test today  ??? CDE for pump  ??? Dexcom to [facility]  Since last visit:   Interim f/u Alita Chyle, CDE--->  pump    Currently taking the following for CFRD:   Basaglar Lantus insulin 50 units qhs  Aspart insulin ~30 units ac, + 2:50>150 (taking less than before d/t weight loss)    Denies symptoms consistent with peripheral or autonomic neuropathy  Denies recent lows requiring assistance  Denies recent nocturnal lows  DFE scheduled setup [place] locally     __________________________________________________________  Since last visit:   Wants Omnipod, using Libre  Recent surgery herniated disk, had to quit job  Wife accompanies and works EMS and they have done research on that one and [employer] does diabetes program  Weight loss due to dietary changes  Stopped drinking soda and mainly water/SF juice  Had physical job Radiographer, therapeutic house    Currently taking the following for CFRD:   Hospital doctor Lantus insulin 50 units qhs  Aspart insulin ~30 units ac, + 2:50>150 (taking less than before d/t weight loss)    Denies symptoms consistent with peripheral or autonomic neuropathy  Denies recent lows requiring assistance  Denies recent nocturnal lows  DFE scheduled setup [place] locally     Continuous glucose monitor (CGM) was computer downloaded, I personally reviewed greater than 72 hours of data and interpreted, and discussed the findings with the patient.   Evening excursions        __________________________________________________  ________________________      Initial HPI carried forward.   Patient is a 36 year old male with a PMH of CF, HTN and GAD/MDD who presents for a recurrence of CFRD.     The patient was initially treated for cystic fibrosis related diabetes (CFRD) when he received his care at [facility] with 5 units of levemir at night. Subsequently, after moving to [place], he was taken off of his diabetes medications which was approximately 3 years ago. Since then he has continued to check his blood sugar approximately three times per day. Recently, his blood sugars have been typically running 200's - 300's. He was scheduled for endocrinology assessment for CFRD and prior to today's appointment, his hemoglobin A1c was found to be 7.6%. Upon arrival to the clinic today, his POCT glucose was 306 mg/dL.     He denies any regular hypglycemic episodes. If he does not eat much, he reports that he will occasionally dip lower than usual (approxiamtely once per week his blood glucose will run <100 mg/dL and approximately once per month his blood glucose will be <60 mg/dL). He states that recently, however, the lowest he has seen is 160 mg/dL. He endorses occasional blurriness in his vision and denies any neuropathic symptoms in his feet. He requires a new meter. His cystic fibrosis affects his lungs, sinuses and GI tract; he endorses pancreatic involvement. He does not appear malnourished. Most recently he was hospitalized in January 2019 for a CF-bronchiectasis exacerbation. He had a RUL lobectomy in August  2018. For his HTN, he takes lisinopril and carvedilol and he takes lamotrigine for his GAD and MDD.    The past medical history, surgical history, family history, social history, medications and allergies were reviewed in Epic.    Past Medical History:   Diagnosis Date   ??? Anxiety    ??? Chronic pain disorder    ??? Cystic fibrosis (CMS-HCC)    ??? Depression    ??? Hypertension    ??? Lumbar radiculopathy 10/26/2020   ??? Nonproductive cough 04/05/2018     Past Surgical History:   Procedure Laterality Date   ??? IR INSERT PORT AGE GREATER THAN 5 YRS  03/27/2019    IR INSERT PORT AGE GREATER THAN 5 YRS 03/27/2019 Rush Barer, MD IMG [facility]   ??? PR REMOVAL OF LUNG,LOBECTOMY Right 03/29/2017    Procedure: REMOVAL OF LUNG, OTHER THAN PNEUMONECTOMY; SINGLE LOBE (LOBECTOMY);  Surgeon: Cherie Dark, MD;  Location: MAIN OR [facility];  Service: Thoracic     Current Outpatient Medications on File Prior to Visit   Medication Sig Dispense Refill   ??? acetaminophen (TYLENOL) 500 MG tablet Take 1 tablet (500 mg total) by mouth every six (6) hours as needed. 30 tablet 0   ??? albuterol (PROVENTIL HFA;VENTOLIN HFA) 90 mcg/actuation inhaler Inhale 2 puffs every six (6) hours as needed. 1 Inhaler 1   ??? albuterol 2.5 mg /3 mL (0.083 %) nebulizer solution Inhale 3 mL (2.5 mg total) by nebulization two (2) times a day. 540 mL 3   ??? amitriptyline (ELAVIL) 100 MG tablet TAKE ONE TABLET BY MOUTH AT BEDTIME (Patient taking differently: Take 100 mg by mouth nightly.) 30 tablet 1   ??? atorvastatin (LIPITOR) 20 MG tablet Take 40 mg by mouth nightly.     ??? blood sugar diagnostic (GLUCOSE BLOOD) Strp by Other route Three (3) times a day with a meal. Rx sent to [facility] 01/04/20     ??? blood-glucose meter kit Dispense meter that is preferred by patient's insurance company 1 each 0   ??? blood-glucose meter,continuous (DEXCOM G6 RECEIVER) Misc Use as directed 1 each 0   ??? blood-glucose sensor (DEXCOM G6 SENSOR) Devi Use sq as directed every 10 days 9 each 11   ??? blood-glucose transmitter (DEXCOM G6 TRANSMITTER) Devi Use as directed, replace every 90 days 1 each 11   ??? budesonide-formoteroL (SYMBICORT) 160-4.5 mcg/actuation inhaler Inhale 2 puffs Two (2) times a day. 30.6 g 3   ??? cetirizine (ZYRTEC) 10 MG tablet Take 1 tablet (10 mg total) by mouth daily. 30 tablet 11   ??? [EXPIRED] ciprofloxacin HCl (CIPRO) 750 MG tablet Take 1 tablet (750 mg total) by mouth every twelve (12) hours for 7 days. 14 tablet 0   ??? dornase alfa (PULMOZYME) 1 mg/mL nebulizer solution Inhale 2.5 mg daily. 450 mL 3   ??? EASY TOUCH LANCING DEVICE Misc Use as directed. ??? EASY TOUCH TWIST LANCETS 30 gauge Misc Use as directed.     ??? elexacaftor-tezacaftor-ivacaft (TRIKAFTA) 100-50-75 mg(d) /150 mg (n) tablet Take 2 Tablets (orange) by mouth in the morning and 1 tablet (blue) in the evening with fatty food 84 tablet 6   ??? FLUoxetine 60 mg Tab Take 60 mg by mouth daily.      ??? fluticasone propionate (FLONASE) 50 mcg/actuation nasal spray 2 sprays into each nostril daily.     ??? FREESTYLE LIBRE 14 DAY SENSOR kit Use 3-4 times daily     ??? gabapentin (NEURONTIN) 600  MG tablet Take 600 mg by mouth Three (3) times a day.     ??? insulin ASPART (NOVOLOG U-100 INSULIN ASPART) 100 unit/mL injection Subcutaneously infuse up to 150 units daily via insulin pump 150 mL 11   ??? insulin pump cart,automated,BT (OMNIPOD 5 G6 PODS, GEN 5,) Crtg 1 each by subcutaneous (via wearable injector) route every three (3) days. Change every 72 hour 10 each 12   ??? lamoTRIgine (LAMICTAL) 200 MG tablet Take 400 mg by mouth daily.     ??? lipase-protease-amylase (CREON) 36,000-114,000- 180,000 unit CpDR Take 8 caps by mouth with meals three times daily and 4 caps with snacks up to three times a day.     ??? lisinopriL (PRINIVIL,ZESTRIL) 10 MG tablet Take 1 tablet (10 mg total) by mouth daily. 30 tablet 0   ??? melatonin 10 mg Tab Take 10 mg by mouth nightly as needed.     ??? montelukast (SINGULAIR) 10 mg tablet Take 1 tablet (10 mg total) by mouth nightly. 90 tablet 3   ??? MVW COMPLETE FORMUL PROBIOTIC 40 billion cell -15 mg CpDR Take 1 capsule by mouth daily. (Patient taking differently: Take 1 capsule by mouth daily.) 90 capsule 3   ??? MVW Complete, pediatric multivit 61-D3-vit K, (MVW COMPLETE FORMULATION) 1,500-800 unit-mcg cap Take 2 capsules by mouth Two (2) times a day.     ??? nebulizers Misc Use as directed with inhaled medications 4 each 3   ??? omeprazole (PRILOSEC) 20 MG capsule Take 1 capsule (20 mg total) by mouth in the morning. 90 capsule 3   ??? pen needle, diabetic (BD ULTRA-FINE NANO PEN NEEDLE) 32 gauge x 5/32 (4 mm) Ndle use up to 4 times daily 400 each 12   ??? [EXPIRED] piperacillin-tazobactam (ZOSYN) 4.5 gram/100 mL PgBk Infuse 100 mL (4.5 g total) into a venous catheter every six (6) hours for 9 days. 3600 mL 0   ??? pramipexole (MIRAPEX) 0.125 MG tablet Take 0.25 mg by mouth nightly.     ??? sodium chloride 7% 7 % Nebu Inhale 4 mL by nebulization two (2) times a day.     ??? traZODone (DESYREL) 150 MG tablet Take 150 mg by mouth nightly.     ??? XARELTO 20 mg tablet Take 20 mg by mouth daily.       No current facility-administered medications on file prior to visit.     SOCIAL HISTORY:  Social History     Social History Narrative   ??? Not on file     FAMILY HISTORY:  Family History   Problem Relation Age of Onset   ??? Bipolar disorder Mother    ??? Depression Mother      ROS:  The balance of 10 systems was reviewed and unremarkable except as stated above.     Objective:   There were no vitals taken for this visit.  ***  Alert and oriented with appropriate mood and affect, appears well and in no distress  NECK: Supple without carotid bruits, thyromegaly or lymphadenopathy.   HEART: Regular rate and rhythm without murmur, rub, gallop.  ABDOMEN: Soft and nontender without masses.   EXTREMITIES: Showed no edema, distal pulse intact, hygiene good  NEURO: Sensation intact on the soles of the feet to the 10g monofilament on bilateral barefoot examination      REVIEW OF RECENT PERTINENT LABORATORY AND IMAGING DATA:  Lab Results   Component Value Date    A1C 9.0 (H) 06/08/2021    A1C 10.0 (H)  03/09/2021    A1C 10.7 (H) 11/29/2020    A1C 9.4 (H) 01/18/2020    A1C 8.8 (H) 09/29/2019    A1C 8.5 (H) 08/12/2018    A1C 10.7 (H) 04/06/2018    A1C 9.0 (H) 01/07/2018     Lab Results   Component Value Date    CREATININE 0.84 09/19/2021     Lab Results   Component Value Date    GFRNONAA 114 01/18/2020     Lab Results   Component Value Date    GFRAA 132 01/18/2020     Lab Results   Component Value Date    K 4.5 09/19/2021     Lab Results   Component Value Date    CHOL 148 12/05/2020     Lab Results   Component Value Date    LDL 96 12/05/2020     Lab Results   Component Value Date    HDL 36 (L) 12/05/2020     Lab Results   Component Value Date    TRIG 80 12/05/2020     Lab Results   Component Value Date    ALT 44 09/11/2021    ALT 31 01/18/2020     No results found for: LABCREA, ALBQTUR, MSHCGMOM, ALBCRERAT  No results found for: VITAMINB12  Lab Results   Component Value Date    TSH 2.120 11/18/2016     Lab Results   Component Value Date    WBC 6.4 09/19/2021    HGB 14.0 09/19/2021    HCT 42.3 09/19/2021    PLT 300 09/19/2021     Lab Results   Component Value Date    VITDTOTAL 38.2 02/06/2021     Lab Results   Component Value Date    CALCIUM 8.5 (L) 09/19/2021       Assessment/Plan:   1. 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 Encounter for long-term (current) use of insulin (CMS-HCC)  As above    3. Obesity  Noted, commended on efforts    Follow up recommended in about 4 mo w CDE interim for pump discussion, he is aware to contact us or return sooner for interim concerns.

## 2021-10-09 NOTE — Unmapped (Signed)
Faxed a prescription to [facility] for a Pari LC Plus nebulizer cup per patient request. Patient sent a My Chart message to inform him.

## 2021-10-10 MED ORDER — NEBULIZER ACCESSORIES KIT
PACK | Freq: Two times a day (BID) | 4 refills | 0 days | Status: CP
Start: 2021-10-10 — End: 2022-10-10

## 2021-10-10 NOTE — Unmapped (Signed)
Nicole sent prescription for 4 Pari LC Plus nebulizer resuable cups to [facility] to be covered via [employer]. The patient was informed via My Chart.

## 2021-10-11 MED FILL — LC PLUS MISC: 84 days supply | Qty: 3 | Fill #0

## 2021-10-13 DIAGNOSIS — H9190 Unspecified hearing loss, unspecified ear: Principal | ICD-10-CM

## 2021-10-16 NOTE — Unmapped (Unsigned)
[facility]  Department of Audiology    AUDIOLOGIC EVALUATION REPORT     PATIENT: Christian Young, Christian Young  DOB: 06-Nov-1985  MRN: [id number]  DOS: 10/20/2021    HISTORY     Vincenzo Stave is a 36 y.o. male who presents to [facility] for an audiologic evaluation. {Audiology Companion:88137} Patient is referred by ***.    Patient reports a history of:  ??? {gen negative/positive:315881} history for ear infections/ear drainage  ??? {gen negative/positive:315881} history for noise exposure  ??? {gen negative/positive:315881} family history of hearing loss  ??? {gen negative/positive:315881} history of ear surgery  ??? {gen negative/positive:315881} history of amplification    Today patient reports the following symptoms:  ??? {gen negative/positive:315881} for otalgia   ??? {gen negative/positive:315881} for aural fullness  ??? {gen negative/positive:315881} for dizziness  ??? {gen negative/positive:315881} for tinnitus   ??? {gen negative/positive:315881} for hearing loss    No other ear or hearing concerns.    RESULTS     Otoscopy  RIGHT Ear: {otoscopy:64378::clear external auditory canal}  LEFT Ear: {otoscopy:64378::clear external auditory canal}    Tympanometry - using a 226 Hz probe tone  RIGHT Ear: {tymp results:64372::Type A tympanogram, consistent with normal middle ear pressure, compliance, and volume}  LEFT Ear: {tymp results:64372::Type A tympanogram, consistent with normal middle ear pressure, compliance, and volume}    Pure Tone and Speech Audiometry  Today's behavioral evaluation was completed using conventional audiometry via {Transducers:64381} with {test reliability:64382} reliability.     RIGHT Ear: ***Normal hearing sensitivity *** {Hearing Loss Type:69749} hearing loss  ??? Speech Recognition Threshold (SRT): *** dB HL  ??? Word Recognition Score (Recorded NU-6): ***% at *** dB HL    LEFT Ear: ***Normal hearing sensitivity *** {Hearing Loss Type:69749} hearing loss  ??? Speech Recognition Threshold (SRT): *** dB HL  ??? Word Recognition Score (Recorded NU-6): ***% at *** dB HL    ***    IMPRESSIONS     Patient was counseled on today's results via {ADULTAUDPATIENTEDU:82630} and expressed understanding. No barriers to education were identified.    RECOMMENDATIONS     ??? {Adult Audio Recommendations:82603}  ??? {Adult Audio Recommendations:82603}  ??? {Adult Audio Recommendations:82603}    Care Provider(s) wore appropriate PPE throughout entire appointment (face mask and eye protection). Patient's companion also wore face mask appropriately for the entire appointment (if applicable). Patient did wear face mask appropriately for entirety of appointment.    Procedure(s):  ???  {uncadultdiagnosticcoding:73527}  ???  {uncadultdiagnosticcoding:73527}    Visit Time: {unccivisittime:73518}    Truddie Hidden Zillah Alexie, AuD, CPS/A, CCC-A, FAAA  Clinical Audiologist  [facility]  Scheduling: [phone]

## 2021-10-16 NOTE — Unmapped (Unsigned)
Pulmonary Clinic - Follow-up Visit        The patient reports they are currently: at home. I spent 30 minutes on the real-time audio and video visit with the patient on the date of service. I spent an additional 20 minutes on pre- and post-visit activities on the date of service.     The patient was not located and I was located within 250 yards of a hospital based location during the real-time audio and video visit. The patient was physically located in [place] or a state in which I am permitted to provide care. The patient and/or parent/guardian understood that s/he may incur co-pays and cost sharing, and agreed to the telemedicine visit. The visit was reasonable and appropriate under the circumstances given the patient's presentation at the time.    The patient and/or parent/guardian has been advised of the potential risks and limitations of this mode of treatment (including, but not limited to, the absence of in-person examination) and has agreed to be treated using telemedicine. The patient's/patient's family's questions regarding telemedicine have been answered.    If the visit was completed in an ambulatory setting, the patient and/or parent/guardian has also been advised to contact their provider???s office for worsening conditions, and seek emergency medical treatment and/or call 911 if the patient deems either necessary.      ASSESSMENT     Christian Young is a 36 y.o. male with cystic fibrosis [id number] and [id number] insertion) who presents to clinic for routine CF follow-up. He has had 4 hospitalizations since 01/2021 (one for COVID in 03/2021, one for CF exacerbation in 05/2021, one for cough and fatigue concerning for CF exacerbation in 07/2021, once after sinus infection symptoms in 08/2021); also with AKI and bump in LFTs during december admission (med related, also had some hypotension initially in his admission) as well as tinnitus and skin reaction at port site during tobramycin infusion in January 2023 - making this a less desirable option for his exacerbation treatment in future     Will get him to see ENT and audiology to reassess hearing, start inhaled colistin nebs given multiple hospitalizations in past 6 months to see if this can help his exacerbation frequency, and start cipro for outpatient exacerbation based on cough, weight loss - if he requires admission would plan to do IV zosyn but would NOT rechallenge with IV tobramycin given skin/infusion reaction last time and given tinnitus - would likely continue oral quinolone. Please collect sputum sample (AFB and CF sputum culture) if admitted to guide further therapy (no cultures collected last admission)       Problem List Items Addressed This Visit    None      PLAN       Cystic fibrosis -   - Continue Trikafta - LFT's annually (last checked 11/2020, due at next visit ~11/2021 - did get LFTs done 08/18/21 through PCP which were within normal limits so ok through 08/2022)   - Continue home CF inhaled meds and airway clearance twice daily - increase airway clearance frequency now given exacerbation symptoms  - previously on TOBI nebs, had rash with Tobramycin infusion and tinnitus with tobramycin infusion as well during last admin 08/2021 - in future will need to consider alternate therapy, for now if admitted plan to use IV zosyn in combination with oral quinolone   - will try to order inhaled colistin to start as cycled suppressive therapy given recurrent exacerbations - will place order through shared services  -  No trikafta fills since 11/22, per patient has excess supply at home - will need to bring home ETI supply during hospitalizations    CF sinus disease  - referred to [facility] ENT last visit due to history of sinus disease (cancelled visit),   - upcoming audiology visit for tinnitus 3/10    Pancreatic insufficiency secondary to cystic fibrosis  - Continue Creon and MVW vitamins   - plan to check Vitamin A, D, E levels and total IgE level at next visit in 11/2021 - since visit is virtual today, defer to next visit OR plan to obtain with labs at end of exacerbation course if admitted    GERD  - Worse recently, increase PPI to BID, monitor pattern of symptoms     CF related diabetes - Patient follows with [facility] for his CF-related diabetes - last A1c 11.6 on 08/18/21, using Freestyle [place]  - is on ACEi for hypertension as well (lisinopril)  - elevated LDL (103), TC 185, HDL low (83), TGL 254 (high) 08/18/21 at PCP office     Hypertension  - lisinopril 10 mg daily, well controlled by report    At risk for CF bone disease  - last DEXA normal in 2018, repeat in 5 years (would be due this year, in 2023 - try to coordinate at next visit)     Mood disorder  - continue local follow up with psychiatry, things going well  - fluoxetine 60mg  daily, amitriptyline 100mg  daily, and lamotrigine 400mg  daily  - trazodone was deacreased during admission to 100 mg nightly due to hypotension    Chronic back pain  - gabapentin 600 mg TID    History of DVT  - xarelto 20 mg daily    Healthcare maintenance:  Immunization History   Administered Date(s) Administered   ??? COVID-19 VACC,MRNA,(PFIZER)(PF) 10/31/2019, 11/21/2019, 07/11/2020, 10/10/2020   ??? Influenza Nasal, Unspecified Formulation 06/14/2020   ??? Influenza Vaccine Quad (IIV4 PF) 6-55mo 06/26/2021   ??? Influenza Vaccine Quad (IIV4 PF) 23mo+ injectable 09/06/2015, 04/10/2019   ??? Influenza Vaccine Quad (IIV4 W/PRESERV) 67MO+ 05/22/2016, 04/18/2017, 06/20/2020   ??? Influenza Virus Vaccine PF(cciiv3) 33yrs+ from ce 06/02/2015   ??? Influenza Virus Vaccine, unspecified formulation 06/21/2016, 04/18/2017, 04/29/2018, 04/11/2019   ??? PNEUMOCOCCAL POLYSACCHARIDE 23 02/18/2013, 04/11/2019   ??? TdaP 01/12/2015   Thinks got flu shot at [facility] drug  Thinks he got bivalent booster great     The patient will return to clinic in 3 months to see Dr. [name], or sooner as needed. Patient discussed with Dr. Janeth Rase, MD  [facility] Pediatric Pulmonary Fellow  [facility] Adult Pulmonary and Critical Care Fellow  [phone]    HISTORY:     Interval History:  Diagnosed at age 58 years old with chronic infections, had consolidation and parapneumonic effusion,sent to [facility] for treatment since then.   [id number] and [id number] insertion. 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     [place] presents to clinic today for routine CF follow-up. Last seen in clinic virtually 08/22/21 - subsequently had episode of coughing so hard he passed out - also with recent cough and chills and small volume hemoptysis preceding that event that had not yet improved despite 4 days of oral antibiotics. Admitted to [facility] 1/27-1/31/23 and started on tobramycin and zosyn, was unable to produce satisfactory sputum culture. Was discharged home on IV antibiotics to finish 14 day course, but developed redness at port site with infusion with  tobramycin (continued to tolerate zosyn ok). Did also note some tinnitus with tobramycin as well - ultimately tinnitus was discontinued 2/3 and cipro was resumed orally through 2/10 (along with zosyn)    Does feel like had improved after last antibiotic course, still hadn't built his stamina back up. Went to visit brother in [place], but family members had been sick, and so he got strep throat subsequently treated with augmentin by PCP but has since developed cough and increased sputum - dark green in appearance, no blood in it. Stamina worse again. No fevers. Does feel like he has a lot of sinus congestion and headaches/pressure. Cough and sinus symptoms have been going on since last week. Some wheezing - does albuterol inhaler for that, which does help. NO fevers he has documented but has felt feverish at times. Doesn't recall specifically being too sensitive to spring allergies.     Reports compliance with his trikafta    For his sinuses, doing netti pot and for airway clearance doing vest with albuterol and 7% HTS - increased vest to TID currently     Still having some ringing in ears, but only every once in a while - has rescheduled his audiology appointment for 10/20/21    Cystic fibrosis issues:  Sinuses: Uses Flonase and takes Claritin and Singulair.     Airway clearance: Does albuterol, and 7% hypertonic saline twice a day.  Reports compliance to it.  Does Pulmozyme daily.  Uses the vest occasionally.     GI and nutrition:   - Appetite has been ok - but is having some diarrhea currently since being on the augmentin, no blood in stool. Holding Miralax due to diarrhea.   - Denies nausea or vomiting, but is having heartburn - is taking omeprazole 20 mg daily (takes at lunch) - is worse now than prior, has been worse for past month or so. After lunch is the worst time in terms of heartburn. NO dietary changes, no specific triggers seems to be everything (even mashed potatoes) - experiences burning in chest/throat. Is coughing more than usual with this sensation   - Reports compliance to his Creon. He takes 2 MVW D5000 vitamins twice a day and MVW probiotic. daily.   - Current weight is 234 lbs. Recent weight changes - has lost about 20 lbs over past 2 weeks - thinks he has been eating less due to changes in appetite, and also tends to lose weight when sick with exacerbation     Hypertension: On lisinopril 10 mg daily. Was on Coreg before, which was started in 2018 as an inpatient for sinus tachycardia. BP has been well controlled recently.     Diabetes: Followed and managed by endocrinology.  Reports compliance to his insulin. Sugars have been running a bit higher but he says this is typical when he has an exacerbation -  Now has a Therapist, art and it works much better for him than Dexcom.      Mental health issues: Following with local Psychiatrist. Mood has generally been good.     Past Medical History:   Diagnosis Date   ??? Anxiety    ??? Chronic pain disorder    ??? Cystic fibrosis (CMS-HCC)    ??? Depression    ??? Hypertension    ??? Lumbar radiculopathy 10/26/2020   ??? Nonproductive cough 04/05/2018     Past Surgical History:   Procedure Laterality Date   ??? IR INSERT PORT AGE GREATER THAN 5 YRS  03/27/2019  IR INSERT PORT AGE GREATER THAN 5 YRS 03/27/2019 Rush Barer, MD IMG [facility]IR [facility]   ??? PR REMOVAL OF LUNG,LOBECTOMY Right 03/29/2017    Procedure: REMOVAL OF LUNG, OTHER THAN PNEUMONECTOMY; SINGLE LOBE (LOBECTOMY);  Surgeon: Cherie Dark, MD;  Location: MAIN OR [facility];  Service: Thoracic       Other History:  The social history and family history were personally reviewed and updated in the patient's electronic medical record.     Home Medications:  Current Outpatient Medications on File Prior to Visit   Medication Sig Dispense Refill   ??? acetaminophen (TYLENOL) 500 MG tablet Take 1 tablet (500 mg total) by mouth every six (6) hours as needed. 30 tablet 0   ??? albuterol (PROVENTIL HFA;VENTOLIN HFA) 90 mcg/actuation inhaler Inhale 2 puffs every six (6) hours as needed. 1 Inhaler 1   ??? albuterol 2.5 mg /3 mL (0.083 %) nebulizer solution Inhale 3 mL (2.5 mg total) by nebulization two (2) times a day. 540 mL 3   ??? amitriptyline (ELAVIL) 100 MG tablet TAKE ONE TABLET BY MOUTH AT BEDTIME (Patient taking differently: Take 100 mg by mouth nightly.) 30 tablet 1   ??? atorvastatin (LIPITOR) 20 MG tablet Take 40 mg by mouth nightly.     ??? blood sugar diagnostic (GLUCOSE BLOOD) Strp by Other route Three (3) times a day with a meal. Rx sent to [facility] 01/04/20     ??? blood-glucose meter kit Dispense meter that is preferred by patient's insurance company 1 each 0   ??? blood-glucose meter,continuous (DEXCOM G6 RECEIVER) Misc Use as directed 1 each 0   ??? blood-glucose sensor (DEXCOM G6 SENSOR) Devi Use sq as directed every 10 days 9 each 11   ??? blood-glucose transmitter (DEXCOM G6 TRANSMITTER) Devi Use as directed, replace every 90 days 1 each 11   ??? budesonide-formoteroL (SYMBICORT) 160-4.5 mcg/actuation inhaler Inhale 2 puffs Two (2) times a day. 30.6 g 3   ??? cetirizine (ZYRTEC) 10 MG tablet Take 1 tablet (10 mg total) by mouth daily. 30 tablet 11   ??? [EXPIRED] ciprofloxacin HCl (CIPRO) 750 MG tablet Take 1 tablet (750 mg total) by mouth every twelve (12) hours for 7 days. 14 tablet 0   ??? dornase alfa (PULMOZYME) 1 mg/mL nebulizer solution Inhale 2.5 mg daily. 450 mL 3   ??? EASY TOUCH LANCING DEVICE Misc Use as directed.     ??? EASY TOUCH TWIST LANCETS 30 gauge Misc Use as directed.     ??? elexacaftor-tezacaftor-ivacaft (TRIKAFTA) 100-50-75 mg(d) /150 mg (n) tablet Take 2 Tablets (orange) by mouth in the morning and 1 tablet (blue) in the evening with fatty food 84 tablet 6   ??? FLUoxetine 60 mg Tab Take 60 mg by mouth daily.      ??? fluticasone propionate (FLONASE) 50 mcg/actuation nasal spray 2 sprays into each nostril daily.     ??? FREESTYLE LIBRE 14 DAY SENSOR kit Use 3-4 times daily     ??? gabapentin (NEURONTIN) 600 MG tablet Take 600 mg by mouth Three (3) times a day.     ??? insulin ASPART (NOVOLOG U-100 INSULIN ASPART) 100 unit/mL injection Subcutaneously infuse up to 150 units daily via insulin pump 150 mL 11   ??? insulin pump cart,automated,BT (OMNIPOD 5 G6 PODS, GEN 5,) Crtg 1 each by subcutaneous (via wearable injector) route every three (3) days. Change every 72 hour 10 each 12   ??? lamoTRIgine (LAMICTAL) 200 MG tablet Take 400 mg by mouth  daily.     ??? lipase-protease-amylase (CREON) 36,000-114,000- 180,000 unit CpDR Take 8 caps by mouth with meals three times daily and 4 caps with snacks up to three times a day.     ??? lisinopriL (PRINIVIL,ZESTRIL) 10 MG tablet Take 1 tablet (10 mg total) by mouth daily. 30 tablet 0   ??? melatonin 10 mg Tab Take 10 mg by mouth nightly as needed.     ??? montelukast (SINGULAIR) 10 mg tablet Take 1 tablet (10 mg total) by mouth nightly. 90 tablet 3   ??? MVW COMPLETE FORMUL PROBIOTIC 40 billion cell -15 mg CpDR Take 1 capsule by mouth daily. (Patient taking differently: Take 1 capsule by mouth daily.) 90 capsule 3   ??? MVW Complete, pediatric multivit 61-D3-vit K, (MVW COMPLETE FORMULATION) 1,500-800 unit-mcg cap Take 2 capsules by mouth Two (2) times a day.     ??? nebulizers (LC PLUS) Misc use as directed with nebulized medications twice daily. 4 each 4   ??? nebulizers Misc Use as directed with inhaled medications 4 each 3   ??? omeprazole (PRILOSEC) 20 MG capsule Take 1 capsule (20 mg total) by mouth in the morning. 90 capsule 3   ??? pen needle, diabetic (BD ULTRA-FINE NANO PEN NEEDLE) 32 gauge x 5/32 (4 mm) Ndle use up to 4 times daily 400 each 12   ??? [EXPIRED] piperacillin-tazobactam (ZOSYN) 4.5 gram/100 mL PgBk Infuse 100 mL (4.5 g total) into a venous catheter every six (6) hours for 9 days. 3600 mL 0   ??? pramipexole (MIRAPEX) 0.125 MG tablet Take 0.25 mg by mouth nightly.     ??? sodium chloride 7% 7 % Nebu Inhale 4 mL by nebulization two (2) times a day.     ??? traZODone (DESYREL) 150 MG tablet Take 150 mg by mouth nightly.     ??? XARELTO 20 mg tablet Take 20 mg by mouth daily.       No current facility-administered medications on file prior to visit.        Allergies:  Allergies as of 10/17/2021 - Reviewed 09/08/2021   Allergen Reaction Noted   ??? Aztreonam Anaphylaxis, Hives, Nausea And Vomiting, and Rash 09/06/2015   ??? [place] [aztreonam lysine] Anaphylaxis 12/27/2016   ??? Cefepime Itching, Nausea Only, and Other (See Comments) 09/06/2015   ??? Other Anaphylaxis and Other (See Comments) 09/06/2015   ??? Slo-bid 100 Anaphylaxis 06/20/2017   ??? Tobramycin Tinnitus 09/06/2015   ??? Banana Itching and Nausea And Vomiting 07/29/2016     Genetics:  [id number] and [id number] insertion  ??  Airway Clearance Regimen:  Albuterol  HTS 7% BID  Pulmozyme  Also on symbicort  Uses mainly vest  ??  Inhaled ABX:  Off TOBI due to tinnitus/hearing loss   ??  Hemoptysis:   No  ABPA:             No  Ptx:                  No  Sinusitus:       Yes - has had sinus surgery in the past, but has been awhile   ??  Panc Insuf:     Yes  PEG:                No  DIOS:              In past year, did have blockage that put in local hospital - was able to do laxatives didn't need an  NG tube ([facility] in [place])  CF Liver Dz:   No - did have mild LFT bump during admission 07/2021  ??  Diabetes:        Yes - follows w/ endocrine  Osteopenia:   N/A  ??  Depression:   Yes  ??  Other Co-morbidities:  Herniated disc with lumbar radiculopathy s/p surgery  ??  Social Setting:  Lives with wife in [place]  Not currently working   Used to drink more, but has stopped drinking all together after 07/2021 hospitalization  Does use a fair amount of tyenol -   Does dip occasionally  No vaping or e-cigs    Exacerbation History:   09/2021  07/2021 - hospitalization at [facility],   03/2021 - COVID, treated for CF exacerbation x 8 days and also received remdesivir, but recovered quickly so ultimately attributed more to COVID rather than CF and discharged     Micro History:  MSSA  - 06/08/21, pan S    Mucoid PsA  - 06/08/21 -     Smooth PsA  S: Cipro, Levo, Tobra  I: Aztreo, Cefe, Ceftaz, PT, LVX  ??  Stenotrophamonas  R: Ceftat    MABSC - s/p RLL resection  - Assumed S to erythro from [facility]  06/2017: MASBC not present, but M gordonae is   ????  Review of Systems:  A comprehensive review of systems was completed and negative except as noted in HPI.    PHYSICAL EXAM:     There were no vitals filed for this visit. as this was a virtual visit   There is no height or weight on file to calculate BMI.  Wt Readings from Last 3 Encounters:   09/09/21 (!) 110.8 kg (244 lb 4.3 oz)   07/30/21 (!) 111.1 kg (245 lb)   06/08/21 (!) 113.4 kg (250 lb)     GEN: Cooperative male, NAD, green hair  HEENT: NCAT, EOMI, sclerae anicteric, MMM  HEART/CV: appears well perfused  LUNGS/RESP: normal WOB on RA  ABD/GI:   EXT: No cyanosis  SKIN: No rashes or lesions on visible skin  NEURO: No focal deficits noted  PSYCH: Awake, alert, and interactive. Mood and affect appropriate.     LABORATORY and RADIOLOGY DATA:     Pulmonary Function Tests:    08/01/21          Interpretation: Spirometry demonstrates mild obstruction and is unchanged from prior test.     Pertinent Laboratory Data:  BMI:   There is no height or weight on file to calculate BMI.    Last Sputum Cx: 06/08/21 - 2+ MSSA (pan-S), <1+ Mucoid Pseudomonas (R imipenem, I cipro, levofloxacin, meropenem, S aztreonam, ceftaz, pip-tazo)  Last AFB:  06/08/21 - smear negative, culture negative     DEXA Scan:  03/04/17  Result: normal  Last OGTT:  N/a CFRD    CF Sputum Culture   Date Value Ref Range Status   06/08/2021 2+ Methicillin-Susceptible Staphylococcus aureus (A)  Final   06/08/2021 <1+ Mucoid Pseudomonas aeruginosa (A)  Final   06/08/2021 2+ Oropharyngeal Flora Isolated  Final        AFB Smear   Date Value Ref Range Status   06/08/2021  No Acid Fast Bacilli Seen Final    NO ACID FAST BACILLI SEEN- 3 negative smears do not exclude pulmonary TB. If active pulmonary TB is suspected, continue airborne isolation until pulmonary disease is excluded by negative cultures.     AFB Culture  Date Value Ref Range Status   06/08/2021 No Acid Fast Bacilli Detected  Final        IgE, Total   Date Value Ref Range Status   02/06/2021 18.8 2-214 IU/mL IU/mL Final        Total Bilirubin   Date Value Ref Range Status   09/11/2021 0.3 0.3 - 1.2 mg/dL Final     AST   Date Value Ref Range Status   09/11/2021 33 <=34 U/L Final     ALT   Date Value Ref Range Status   09/11/2021 44 10 - 49 U/L Final     Alkaline Phosphatase   Date Value Ref Range Status   09/11/2021 81 46 - 116 U/L Final        INR   Date Value Ref Range Status   07/30/2021 2.00  Final        Hemoglobin A1C   Date Value Ref Range Status   06/08/2021 9.0 (H) 4.8 - 5.6 % Final     Estimated Average Glucose   Date Value Ref Range Status   06/08/2021 212 mg/dL Final        Vitamin A   Date Value Ref Range Status   02/06/2021 58.7 32.5 - 78.0 mcg/dL Final     Comment:        -------------------ADDITIONAL INFORMATION-------------------  This test was developed and its performance characteristics   determined by [facility] in a manner consistent with CLIA   requirements. This test has not been cleared or approved by   the U.S. Food and Drug Administration.     Test Performed by:  [facility]  [address] [place], [place], [place] [address]  Lab Director: Paul Dykes M.D. Ph.D.; CLIA# [id number]     Vitamin E   Date Value Ref Range Status   02/06/2021 16.0 5.5 - 17.0 mg/L Final     Comment:        -------------------ADDITIONAL INFORMATION-------------------  This test was developed and its performance characteristics   determined by [facility] in a manner consistent with CLIA   requirements. This test has not been cleared or approved by   the U.S. Food and Drug Administration.     Test Performed by:  [facility]  [address] [place], [place], [place] [address]  Lab Director: Paul Dykes M.D. Ph.D.; CLIA# [id number]     Vitamin D Total (25OH)   Date Value Ref Range Status   02/06/2021 38.2 20.0 - 80.0 ng/mL Final        CT Chest 06/24/18  FINDINGS:   Right lower lobe and inferior lingular bronchiectasis. Right lower lobe peripheral mucoid impacted airways and tree-in-bud nodular opacities unchanged. Gas trapping present all lung lobes, but most severe in the right lower lobe.   ??  Right upper lobectomy with stable postoperative changes. No consolidation.   ??  No pleural effusion.  ??  Right-sided Port-A-Cath terminates in the right atrium. Heart size normal. No pericardial effusion. Normal caliber thoracic aorta. No mediastinal lymphadenopathy.  ??  Bilateral gynecomastia. Hepatic steatosis. Osseous structures unremarkable.  ??  IMPRESSION:  ??  Chronic small airways disease including gas trapping and peripheral mucoid impacted airways unchanged compared 12/29/2017 mediastinal lymphadenopathy.  ??  Bilateral gynecomastia. Hepatic steatosis. Osseous structures unremarkable.  ??  IMPRESSION:  ??  Chronic small airways disease including gas trapping and peripheral mucoid impacted airways unchanged compared 12/29/2017

## 2021-10-17 ENCOUNTER — Telehealth: Admit: 2021-10-17 | Discharge: 2021-10-18 | Payer: PRIVATE HEALTH INSURANCE

## 2021-10-17 ENCOUNTER — Ambulatory Visit
Admit: 2021-10-17 | Discharge: 2021-10-18 | Payer: PRIVATE HEALTH INSURANCE | Attending: Registered" | Primary: Registered"

## 2021-10-17 DIAGNOSIS — K219 Gastro-esophageal reflux disease without esophagitis: Principal | ICD-10-CM

## 2021-10-17 MED ORDER — NEEDLE (DISP) 22 GAUGE X 1"
Freq: Two times a day (BID) | 6 refills | 0 days
Start: 2021-10-17 — End: 2021-11-16

## 2021-10-17 MED ORDER — STERILE WATER FOR INHALATION
Freq: Two times a day (BID) | RESPIRATORY_TRACT | 11 refills | 30 days
Start: 2021-10-17 — End: 2021-11-16

## 2021-10-17 MED ORDER — COLISTIMETHATE (COLISTIN) INHALATION
Freq: Two times a day (BID) | RESPIRATORY_TRACT | 0 refills | 0 days
Start: 2021-10-17 — End: ?

## 2021-10-17 MED ORDER — OMEPRAZOLE 20 MG CAPSULE,DELAYED RELEASE
ORAL_CAPSULE | Freq: Every day | ORAL | 3 refills | 90 days | Status: CP
Start: 2021-10-17 — End: 2022-10-17

## 2021-10-17 MED ORDER — CIPROFLOXACIN 750 MG TABLET
ORAL_TABLET | Freq: Two times a day (BID) | ORAL | 0 refills | 14 days | Status: CP
Start: 2021-10-17 — End: 2021-10-31

## 2021-10-17 MED ORDER — SYRINGE (DISPOSABLE) 3 ML
Freq: Two times a day (BID) | 6 refills | 0 days
Start: 2021-10-17 — End: ?

## 2021-10-17 NOTE — Unmapped (Signed)
Adult Cystic Fibrosis Clinic Pharmacist Note     Purpose: Trikafta fill history - last filled 06/19/21 for 28 days    ??? October 17, 2021 3:22 PM: Phone call to patient  ???  Spoke with patient and said that he knows he needs to call [facility] to refill his Trikafta. States that he still has some medication remaining at home. Has the number for [facility] and encouraged patient to reach out to schedule refill. Advised patient to reach out to me if he has any issues filling Trikafta for the first time this year.       Total time spent: 5 minutes     Electronically signed:  Alben Spittle, PharmD, BCACP, CPP  Clinical Pharmacist Practitioner  [facility] Adult Cystic Fibrosis/Pulmonary Clinic  [phone]     CC:   ??? Moses Manners, MD

## 2021-10-19 MED ORDER — SYRINGE 3 ML 20 GAUGE X 1"
6 refills | 0 days | Status: CP
Start: 2021-10-19 — End: ?
  Filled 2021-10-23: qty 60, 30d supply, fill #0

## 2021-10-19 MED ORDER — NEBULIZERS
6 refills | 0 days | Status: CP
Start: 2021-10-19 — End: ?

## 2021-10-19 MED ORDER — COLISTIN (COLISTIMETHATE SODIUM) 150 MG SOLUTION FOR INJECTION
Freq: Two times a day (BID) | RESPIRATORY_TRACT | 6 refills | 30 days | Status: CP
Start: 2021-10-19 — End: 2021-11-18
  Filled 2021-10-23: qty 120, 30d supply, fill #0

## 2021-10-19 MED ORDER — WATER FOR INJECTION, STERILE INTRAVENOUS SOLUTION
6 refills | 0 days | Status: CP
Start: 2021-10-19 — End: ?
  Filled 2021-10-23: qty 600, 30d supply, fill #0

## 2021-10-19 MED ORDER — ALCOHOL SWABS
0 refills | 0 days | Status: CP
Start: 2021-10-19 — End: ?
  Filled 2021-10-23: qty 100, 30d supply, fill #0

## 2021-10-19 NOTE — Unmapped (Addendum)
It was so nice to see you today!  Goals and plans we discussed today:  Ciprofloxacin started for exacerbation - will also work towards ordering colistin inhaled to see if that can help frequent exacerbations  Increase omeprazole to twice a day given increased reflux symptoms  If you need admission in future, will not use tobramycin given recent reactions  Make sure you are taking your trikafta regularly, let us know if issues filling meds  Follow up with endocrine doctors about your diabetes  ENT and audiology referral to check hearing, evaluate sinuses  Labs and DEXA scan at your next visit in about 3 months in person     Contact information:  Our clinic is located at:  [address]  [place], [place] [address]    You can reach [name] at:  Front Desk/Appointments: [phone]  Fax: [phone]      Thank you for allowing [name] to be a part of your care!     To reach your CF nurse coordinators:    Patients with the last name A-K: Joni Reining [phone]  Patients with the last name L-Z: Harriett Sine [phone]     For urgent issues after hours/weekends:  Hospital Operator: [phone]) [phone], ask for Pulmonary Fellow on-call     To make or change a clinic appointment:   [facility]: [phone]     --> When you should use MyChart:           - Order a prescription refill          - View test results          - Send a non-urgent message or update to the care team          - View after-visit summaries           - See or pay bills      --> When you should call (NOT use MyChart)           - Increase in cough          - Change in amount of mucus or mucus color           - Coughing up blood or blood-tinged mucus          - Chest pain          - Shortness of breath           - Lack of energy, feeling sick, or increase in tiredness     --> I don't have a MyChart. Why should I get one?           - It's encrypted, so your information is secure          - It's a quick, easy way to contact the care team, manage appointments, see test results, and more!      --> How do I sign-up for MyChart?            - Download the MyChart app from the [employer] or [employer] and sign-up in the app           - Sign-up online at [email or web address]

## 2021-10-20 MED ORDER — EMPTY CONTAINER
0 refills | 0 days
Start: 2021-10-20 — End: ?

## 2021-10-20 NOTE — Unmapped (Signed)
[facility] Specialty Medication Onboarding    Specialty Medication: colistimethate  Prior Authorization: Not Required   Financial Assistance: No - copay  <$25  Final Copay/Day Supply: $0 / 30    Insurance Restrictions: None     Notes to Pharmacist: Supplies (alcohol pads, syringes, neb cup, swfi) are $0    The triage team has completed the benefits investigation and has determined that the patient is able to fill this medication at [facility]. Please contact the patient to complete the onboarding or follow up with the prescribing physician as needed.

## 2021-10-20 NOTE — Unmapped (Signed)
[facility]   Patient Onboarding/Medication Counseling    Christian Young is a 36 y.o. male with cystic fibrosis who I am counseling today on initiation of therapy.  I am speaking to the patient.    Was a Nurse, learning disability used for this call? No    Verified patient's date of birth / HIPAA.    Specialty medication(s) to be sent: CF/Pulmonary: -Nebulized colistin (150mg  vials) and supply kit  -Trikafta (has one week's worth of medication remaining)    Non-specialty medications/supplies to be sent: n/a      Medications not needed at this time: n/a         Inhaled Colistin (with SWFI)    Medication & Administration     Dosage: Colistin 150mg : Inhale 2 mL (150 mg)  of mixed colistin and 1 mL of NS (total volume: 3 mL) every twelve (12) hours via nebulizer. for cycled 28 days on and 28 days off.     How Supplied:  ??? Colistin 75mg /mL (2-mL vial powder for injection)  ??? Sterile Water for Injection (SWFI) vials  ??? Alcohol swabs  ??? 3-mL syringe with needles     How to Use Colistin for Inhaling:    Step 1: Remove the lids from the colistin and SWFI and wipe top of rubber vial with alcohol swab.     Step 2: Uncap the 3-mL syringe with needle and draw up 2mL of air and add that air to the SWFI vial, then draw up 2mL of SWFI. Remove syring and inject 2mL of SWFI into the colistin vial.     Step 3: Gently roll the Colistin vial to mix, do not shake vigorously as this will create foaming.     Step 4: Based on what dose prescribed:     For 150-mg Dose: For 75-mg Dose:      -Uncap the 3-mL syringe with needle and draw up 2mL of air and add that to the mixed colistin vial  -Leaving the syringe in the vial, invert the vial and withdraw 2mL (150 mg) of colistin and add to nebulizer cup.  -Draw up 1mL of air into your syringe again and add that to the SWFI, then draw up 1mL of SWFI and add to nebulizer cup  -You should have a total volume: 3mL   -Uncap the 3-mL syringe with needle and draw up 1mL of air and add that to the mixed colistin vial  -Leaving the syringe in the vial, invert the vial and withdraw 1mL (75 mg) of colistin and add to nebulizer cup.  -Draw up 2mL of air into your syringe again and add that to the SWFI, then draw up 2mL of SWFI and add to nebulizer cup  -You should have a total volume: 3mL      Step 5: Inhale the colistin mixture by aerosol nebulizer twice a day for 28 days on and 28 days off.  Do not be alarmed if you see the colistin foaming in your nebulizer, this commonly happens during nebulizer treatment and cannot be prevented.     Step 6: After use, rinse nebulizer well with warm water to prevent build up.     Discard any remaining supply in the vials you have used for this dose. All vials are single use only.    Adherence/Missed dose instructions: If it is close to your next dose then wait until the next dose and resume your normal dosing schedule. Do not take more than your normal dose to  make up for a missed dose.     Goals of Therapy     To treat or control bacterial infection in lungs    Side Effects & Monitoring Parameters     ??? Bronchospasm, coughing (can administered albuterol prior to colistin dose)  ??? Chest tightness  ??? Wheezing    The following side effects should be reported to the provider:  ??? Numbness, tingling, or formication of the extremities, generalized pruritus, vertigo, dizziness, and slurring of speech      Contraindications, Warnings, & Precautions     ??? Avoid in pregnancy  ??? Transient Neurological Disturbances    Drug/Food Interactions     ??? Medication list reviewed in Epic. The patient was instructed to inform the care team before taking any new medications or supplements. No drug interactions identified.      Storage, Handling Precautions, & Disposal     ??? Store at room temperature   ??? Dispose of syringes and needles in a sharps container        Current Medications (including OTC/herbals), Comorbidities and Allergies     Current Outpatient Medications   Medication Sig Dispense Refill   ??? acetaminophen (TYLENOL) 500 MG tablet Take 1 tablet (500 mg total) by mouth every six (6) hours as needed. 30 tablet 0   ??? albuterol (PROVENTIL HFA;VENTOLIN HFA) 90 mcg/actuation inhaler Inhale 2 puffs every six (6) hours as needed. 1 Inhaler 1   ??? albuterol 2.5 mg /3 mL (0.083 %) nebulizer solution Inhale 3 mL (2.5 mg total) by nebulization two (2) times a day. 540 mL 3   ??? alcohol swabs PadM Use as directed with inhaled antibiotics 100 each 0   ??? amitriptyline (ELAVIL) 100 MG tablet TAKE ONE TABLET BY MOUTH AT BEDTIME (Patient taking differently: Take 100 mg by mouth nightly.) 30 tablet 1   ??? atorvastatin (LIPITOR) 20 MG tablet Take 40 mg by mouth nightly.     ??? blood sugar diagnostic (GLUCOSE BLOOD) Strp by Other route Three (3) times a day with a meal. Rx sent to [facility] 01/04/20     ??? blood-glucose meter kit Dispense meter that is preferred by patient's insurance company 1 each 0   ??? blood-glucose meter,continuous (DEXCOM G6 RECEIVER) Misc Use as directed 1 each 0   ??? blood-glucose sensor (DEXCOM G6 SENSOR) Devi Use sq as directed every 10 days 9 each 11   ??? blood-glucose transmitter (DEXCOM G6 TRANSMITTER) Devi Use as directed, replace every 90 days 1 each 11   ??? budesonide-formoteroL (SYMBICORT) 160-4.5 mcg/actuation inhaler Inhale 2 puffs Two (2) times a day. 30.6 g 3   ??? cetirizine (ZYRTEC) 10 MG tablet Take 1 tablet (10 mg total) by mouth daily. 30 tablet 11   ??? ciprofloxacin HCl (CIPRO) 750 MG tablet Take 1 tablet (750 mg total) by mouth every twelve (12) hours for 14 days. 28 tablet 0   ??? colistimethate (COLYMYCIN) 150 mg injection Inhale 2 mL (150 mg total) Two (2) times a day. Inject 2mL SWFI into colistin vial & gently mix, then withdraw 2mL (150mg ) for dose. 120 mL 6   ??? dornase alfa (PULMOZYME) 1 mg/mL nebulizer solution Inhale 2.5 mg daily. 450 mL 3   ??? EASY TOUCH LANCING DEVICE Misc Use as directed.     ??? EASY TOUCH TWIST LANCETS 30 gauge Misc Use as directed.     ??? elexacaftor-tezacaftor-ivacaft (TRIKAFTA) 100-50-75 mg(d) /150 mg (n) tablet Take 2 Tablets (orange) by mouth in the morning and  1 tablet (blue) in the evening with fatty food 84 tablet 6   ??? FLUoxetine 60 mg Tab Take 60 mg by mouth daily.      ??? fluticasone propionate (FLONASE) 50 mcg/actuation nasal spray 2 sprays into each nostril daily.     ??? FREESTYLE LIBRE 14 DAY SENSOR kit Use 3-4 times daily     ??? gabapentin (NEURONTIN) 600 MG tablet Take 600 mg by mouth Three (3) times a day.     ??? insulin ASPART (NOVOLOG U-100 INSULIN ASPART) 100 unit/mL injection Subcutaneously infuse up to 150 units daily via insulin pump 150 mL 11   ??? insulin pump cart,automated,BT (OMNIPOD 5 G6 PODS, GEN 5,) Crtg 1 each by subcutaneous (via wearable injector) route every three (3) days. Change every 72 hour 10 each 12   ??? lamoTRIgine (LAMICTAL) 200 MG tablet Take 400 mg by mouth daily.     ??? lipase-protease-amylase (CREON) 36,000-114,000- 180,000 unit CpDR Take 8 caps by mouth with meals three times daily and 4 caps with snacks up to three times a day.     ??? lisinopriL (PRINIVIL,ZESTRIL) 10 MG tablet Take 1 tablet (10 mg total) by mouth daily. 30 tablet 0   ??? melatonin 10 mg Tab Take 10 mg by mouth nightly as needed.     ??? montelukast (SINGULAIR) 10 mg tablet Take 1 tablet (10 mg total) by mouth nightly. 90 tablet 3   ??? MVW COMPLETE FORMUL PROBIOTIC 40 billion cell -15 mg CpDR Take 1 capsule by mouth daily. (Patient taking differently: Take 1 capsule by mouth daily.) 90 capsule 3   ??? MVW Complete, pediatric multivit 61-D3-vit K, (MVW COMPLETE FORMULATION) 1,500-800 unit-mcg cap Take 2 capsules by mouth Two (2) times a day.     ??? nebulizers (LC PLUS) Misc use as directed with nebulized medications twice daily. 4 each 4   ??? nebulizers (PARI LC D NEBULIZER) Misc Use with inhaled medication. 1 each 6   ??? nebulizers Misc Use as directed with inhaled medications 4 each 3   ??? omeprazole (PRILOSEC) 20 MG capsule Take 1 capsule (20 mg total) by mouth daily. 90 capsule 3   ??? pen needle, diabetic (BD ULTRA-FINE NANO PEN NEEDLE) 32 gauge x 5/32 (4 mm) Ndle use up to 4 times daily 400 each 12   ??? pramipexole (MIRAPEX) 0.125 MG tablet Take 0.25 mg by mouth nightly.     ??? sodium chloride 7% 7 % Nebu Inhale 4 mL by nebulization two (2) times a day.     ??? syringe with needle (BD LUER-LOK SYRINGE) 3 mL 21 gauge x 1 Syrg For use with inhaled antibiotic 60 each 6   ??? traZODone (DESYREL) 150 MG tablet Take 150 mg by mouth nightly.     ??? sterile water Soln Add 1 mL SWFI into neb cup along with colistin. 600 mL 6   ??? XARELTO 20 mg tablet Take 20 mg by mouth daily.       No current facility-administered medications for this visit.       Allergies   Allergen Reactions   ??? Aztreonam Anaphylaxis, Hives, Nausea And Vomiting and Rash     fevers  Patient stated that he only vomited x1 with [place] in the past.????    fevers  Patient stated that he only vomited x1 with [place] in the past.????   ??? [place] [Aztreonam Lysine] Anaphylaxis   ??? Cefepime Itching, Nausea Only and Other (See Comments)     Headaches also   ???  Other Anaphylaxis and Other (See Comments)     Other reaction(s): Other (See Comments)  Bananas: itchy throat  Slo Bid record from [employer] states anaphylaxis.????Pt states this was from childhood and does not know reaction.  Bananas, causes itchy throat   ??? Slo-Bid 100 Anaphylaxis   ??? Tobramycin Tinnitus     From OSH record-documented as tinnitus but has received IV tobra with close monitoring.  Pt has received Tobramycin at [facility] since this allergy documented    From OSH record-documented as tinnitus but has received IV tobra with close monitoring.   ??? Banana Itching and Nausea And Vomiting       Patient Active Problem List   Diagnosis   ??? Essential hypertension   ??? Depressive disorder   ??? Mood disorder (CMS-HCC)   ??? Anxiety   ??? 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 Chronic pansinusitis   ??? Pancreatic insufficiency due to cystic fibrosis (CMS-HCC)   ??? History of Mycobacterium abscessus infection   ??? Bronchiectasis (CMS-HCC)   ??? Chronic deep vein thrombosis (DVT) of lower extremity (CMS-HCC)   ??? Cystic fibrosis with pulmonary exacerbation (CMS-HCC)   ??? Cystic fibrosis exacerbation (CMS-HCC)   ??? Obesity (BMI 30-39.9)   ??? Restless leg syndrome   ??? Degeneration of lumbar intervertebral disc   ??? Lumbar radiculopathy   ??? Idiopathic peripheral neuropathy   ??? Long term current use of anticoagulant therapy   ??? Acute non-recurrent pansinusitis   ??? AKI (acute kidney injury) (CMS-HCC)   ??? Hemoptysis       Reviewed and up to date in Epic.    Appropriateness of Therapy     Acute infections noted within Epic:  MDR Pseudomonas, CF Patient  Patient reported infection: None    Is medication and dose appropriate based on diagnosis and infection status? Yes    Prescription has been clinically reviewed: Yes      Baseline Quality of Life Assessment      How many days over the past month did your cystic fibrosis  keep you from your normal activities? For example, brushing your teeth or getting up in the morning. Patient declined to answer    Financial Information     Medication Assistance provided: None Required    Anticipated copay of $0 / 30 days reviewed with patient. Verified delivery address.    Delivery Information     Scheduled delivery date: 10/24/21    Expected start date: 10/24/21    Medication will be delivered via UPS to the prescription address in [facility].  This shipment will not require a signature.      Explained the services we provide at [facility] and that each month we would call to set up refills.  Stressed importance of returning phone calls so that we could ensure they receive their medications in time each month.  Informed patient that we should be setting up refills 7-10 days prior to when they will run out of medication.  A pharmacist will reach out to perform a clinical assessment periodically.  Informed patient that a welcome packet, containing information about our pharmacy and other support services, a Notice of Privacy Practices, and a drug information handout will be sent.      The patient or caregiver noted above participated in the development of this care plan and knows that they can request review of or adjustments to the care plan at any time.      Patient  or caregiver verbalized understanding of the above information as well as how to contact the pharmacy at [phone] option 4 with any questions/concerns.  The pharmacy is open Monday through Friday 8:30am-4:30pm.  A pharmacist is available 24/7 via pager to answer any clinical questions they may have.    Patient Specific Needs     - Does the patient have any physical, cognitive, or cultural barriers? No    - Does the patient have adequate living arrangements? (i.e. the ability to store and take their medication appropriately) Yes    - Did you identify any home environmental safety or security hazards? No    - Patient prefers to have medications discussed with  Patient     - Is the patient or caregiver able to read and understand education materials at a high school level or above? Yes    - Patient's primary language is  English     - Is the patient high risk? No    SOCIAL DETERMINANTS OF HEALTH     At the [facility], we have learned that life circumstances - like trouble affording food, housing, utilities, or transportation can affect the health of many of our patients.   That is why we wanted to ask: are you currently experiencing any life circumstances that are negatively impacting your health and/or quality of life? Patient declined to answer    Social Determinants of Health     Food Insecurity: No Food Insecurity   ??? Worried About Programme researcher, broadcasting/film/video in the Last Year: Never true   ??? Ran Out of Food in the Last Year: Never true   Tobacco Use: Medium Risk   ??? Smoking Tobacco Use: Former   ??? Smokeless Tobacco Use: Never   ??? Passive Exposure: Not on file Transportation Needs: No Transportation Needs   ??? Lack of Transportation (Medical): No   ??? Lack of Transportation (Non-Medical): No   Alcohol Use: Not on file   Housing/Utilities: Low Risk    ??? Within the past 12 months, have you ever stayed: outside, in a car, in a tent, in an overnight shelter, or temporarily in someone else's home (i.e. couch-surfing)?: No   ??? Are you worried about losing your housing?: No   ??? Within the past 12 months, have you been unable to get utilities (heat, electricity) when it was really needed?: No   Substance Use: Not on file   Financial Resource Strain: Low Risk    ??? Difficulty of Paying Living Expenses: Not hard at all   Physical Activity: Not on file   Health Literacy: Low Risk    ??? : Never   Stress: Not on file   Intimate Partner Violence: Not on file   Depression: Not on file   Social Connections: Not on file       Would you be willing to receive help with any of the needs that you have identified today? Not applicable       Christian Young  [facility] Specialty Pharmacist

## 2021-10-23 MED FILL — TRIKAFTA 100-50-75 MG (D)/150 MG (N) TABLETS: 28 days supply | Qty: 84 | Fill #6

## 2021-10-23 MED FILL — EMPTY CONTAINER: 90 days supply | Qty: 1 | Fill #0

## 2021-10-23 MED FILL — LC PLUS MISC: 30 days supply | Qty: 1 | Fill #0

## 2021-10-28 NOTE — Unmapped (Unsigned)
Chief Complaint:     36 y.o. male with a PMH of CF, HTN w CFRD.   No chief complaint on file.    Subjective:     HPI   CFRD dx'd ~2/19.       Last seen by myself 03/09/2021  At last visit:   ??? Blood sugars still a little high over night might try more Basaglar 55 or 60 unis  ??? Coverage for dinner w Novolog and prebolus  ??? Urine albumin test today  ??? CDE for pump  ??? Dexcom to [facility]  Since last visit:   Interim f/u Alita Chyle, CDE--->  pump    Currently taking the following for CFRD:   Basaglar Lantus insulin 50 units qhs  Aspart insulin ~30 units ac, + 2:50>150 (taking less than before d/t weight loss)    Denies symptoms consistent with peripheral or autonomic neuropathy  Denies recent lows requiring assistance  Denies recent nocturnal lows  DFE scheduled setup [place] locally     __________________________________________________________  Since last visit:   Wants Omnipod, using Libre  Recent surgery herniated disk, had to quit job  Wife accompanies and works EMS and they have done research on that one and [employer] does diabetes program  Weight loss due to dietary changes  Stopped drinking soda and mainly water/SF juice  Had physical job Radiographer, therapeutic house    Currently taking the following for CFRD:   Hospital doctor Lantus insulin 50 units qhs  Aspart insulin ~30 units ac, + 2:50>150 (taking less than before d/t weight loss)    Denies symptoms consistent with peripheral or autonomic neuropathy  Denies recent lows requiring assistance  Denies recent nocturnal lows  DFE scheduled setup [place] locally     Continuous glucose monitor (CGM) was computer downloaded, I personally reviewed greater than 72 hours of data and interpreted, and discussed the findings with the patient.   Evening excursions        __________________________________________________  ________________________      Initial HPI carried forward.   Patient is a 36 year old male with a PMH of CF, HTN and GAD/MDD who presents for a recurrence of CFRD.     The patient was initially treated for cystic fibrosis related diabetes (CFRD) when he received his care at [facility] with 5 units of levemir at night. Subsequently, after moving to [place], he was taken off of his diabetes medications which was approximately 3 years ago. Since then he has continued to check his blood sugar approximately three times per day. Recently, his blood sugars have been typically running 200's - 300's. He was scheduled for endocrinology assessment for CFRD and prior to today's appointment, his hemoglobin A1c was found to be 7.6%. Upon arrival to the clinic today, his POCT glucose was 306 mg/dL.     He denies any regular hypglycemic episodes. If he does not eat much, he reports that he will occasionally dip lower than usual (approxiamtely once per week his blood glucose will run <100 mg/dL and approximately once per month his blood glucose will be <60 mg/dL). He states that recently, however, the lowest he has seen is 160 mg/dL. He endorses occasional blurriness in his vision and denies any neuropathic symptoms in his feet. He requires a new meter. His cystic fibrosis affects his lungs, sinuses and GI tract; he endorses pancreatic involvement. He does not appear malnourished. Most recently he was hospitalized in January 2019 for a CF-bronchiectasis exacerbation. He had a RUL lobectomy in August  2018. For his HTN, he takes lisinopril and carvedilol and he takes lamotrigine for his GAD and MDD.    The past medical history, surgical history, family history, social history, medications and allergies were reviewed in Epic.    Past Medical History:   Diagnosis Date   ??? Anxiety    ??? Chronic pain disorder    ??? Cystic fibrosis (CMS-HCC)    ??? Depression    ??? Hypertension    ??? Lumbar radiculopathy 10/26/2020   ??? Nonproductive cough 04/05/2018     Past Surgical History:   Procedure Laterality Date   ??? IR INSERT PORT AGE GREATER THAN 5 YRS  03/27/2019    IR INSERT PORT AGE GREATER THAN 5 YRS 03/27/2019 Rush Barer, MD IMG [facility]   ??? PR REMOVAL OF LUNG,LOBECTOMY Right 03/29/2017    Procedure: REMOVAL OF LUNG, OTHER THAN PNEUMONECTOMY; SINGLE LOBE (LOBECTOMY);  Surgeon: Cherie Dark, MD;  Location: MAIN OR [facility];  Service: Thoracic     Current Outpatient Medications on File Prior to Visit   Medication Sig Dispense Refill   ??? acetaminophen (TYLENOL) 500 MG tablet Take 1 tablet (500 mg total) by mouth every six (6) hours as needed. 30 tablet 0   ??? albuterol (PROVENTIL HFA;VENTOLIN HFA) 90 mcg/actuation inhaler Inhale 2 puffs every six (6) hours as needed. 1 Inhaler 1   ??? albuterol 2.5 mg /3 mL (0.083 %) nebulizer solution Inhale 3 mL (2.5 mg total) by nebulization two (2) times a day. 540 mL 3   ??? alcohol swabs PadM Use as directed with inhaled antibiotics 100 each 0   ??? amitriptyline (ELAVIL) 100 MG tablet TAKE ONE TABLET BY MOUTH AT BEDTIME (Patient taking differently: Take 100 mg by mouth nightly.) 30 tablet 1   ??? atorvastatin (LIPITOR) 20 MG tablet Take 40 mg by mouth nightly.     ??? blood sugar diagnostic (GLUCOSE BLOOD) Strp by Other route Three (3) times a day with a meal. Rx sent to [facility] 01/04/20     ??? blood-glucose meter kit Dispense meter that is preferred by patient's insurance company 1 each 0   ??? blood-glucose meter,continuous (DEXCOM G6 RECEIVER) Misc Use as directed 1 each 0   ??? blood-glucose sensor (DEXCOM G6 SENSOR) Devi Use sq as directed every 10 days 9 each 11   ??? blood-glucose transmitter (DEXCOM G6 TRANSMITTER) Devi Use as directed, replace every 90 days 1 each 11   ??? budesonide-formoteroL (SYMBICORT) 160-4.5 mcg/actuation inhaler Inhale 2 puffs Two (2) times a day. 30.6 g 3   ??? cetirizine (ZYRTEC) 10 MG tablet Take 1 tablet (10 mg total) by mouth daily. 30 tablet 11   ??? ciprofloxacin HCl (CIPRO) 750 MG tablet Take 1 tablet (750 mg total) by mouth every twelve (12) hours for 14 days. 28 tablet 0   ??? colistimethate (COLYMYCIN) 150 mg injection Inhale 2 mL (150 mg total) Two (2) times a day, alternating 28 days on and 28 days off. Inject 2mL SWFI into colistin vial & gently mix, then withdraw 2mL (150mg ) for dose. 120 mL 6   ??? dornase alfa (PULMOZYME) 1 mg/mL nebulizer solution Inhale 2.5 mg daily. 450 mL 3   ??? EASY TOUCH LANCING DEVICE Misc Use as directed.     ??? EASY TOUCH TWIST LANCETS 30 gauge Misc Use as directed.     ??? elexacaftor-tezacaftor-ivacaft (TRIKAFTA) 100-50-75 mg(d) /150 mg (n) tablet Take 2 Tablets (orange) by mouth in the morning and 1 tablet (blue) in  the evening with fatty food 84 tablet 6   ??? empty container Misc Use as directed to dispose of syringes 1 each 0   ??? FLUoxetine 60 mg Tab Take 60 mg by mouth daily.      ??? fluticasone propionate (FLONASE) 50 mcg/actuation nasal spray 2 sprays into each nostril daily.     ??? FREESTYLE LIBRE 14 DAY SENSOR kit Use 3-4 times daily     ??? gabapentin (NEURONTIN) 600 MG tablet Take 600 mg by mouth Three (3) times a day.     ??? insulin ASPART (NOVOLOG U-100 INSULIN ASPART) 100 unit/mL injection Subcutaneously infuse up to 150 units daily via insulin pump 150 mL 11   ??? insulin pump cart,automated,BT (OMNIPOD 5 G6 PODS, GEN 5,) Crtg 1 each by subcutaneous (via wearable injector) route every three (3) days. Change every 72 hour 10 each 12   ??? lamoTRIgine (LAMICTAL) 200 MG tablet Take 400 mg by mouth daily.     ??? lipase-protease-amylase (CREON) 36,000-114,000- 180,000 unit CpDR Take 8 caps by mouth with meals three times daily and 4 caps with snacks up to three times a day.     ??? lisinopriL (PRINIVIL,ZESTRIL) 10 MG tablet Take 1 tablet (10 mg total) by mouth daily. 30 tablet 0   ??? melatonin 10 mg Tab Take 10 mg by mouth nightly as needed.     ??? montelukast (SINGULAIR) 10 mg tablet Take 1 tablet (10 mg total) by mouth nightly. 90 tablet 3   ??? MVW COMPLETE FORMUL PROBIOTIC 40 billion cell -15 mg CpDR Take 1 capsule by mouth daily. (Patient taking differently: Take 1 capsule by mouth daily.) 90 capsule 3   ??? MVW Complete, pediatric multivit 61-D3-vit K, (MVW COMPLETE FORMULATION) 1,500-800 unit-mcg cap Take 2 capsules by mouth Two (2) times a day.     ??? nebulizers (LC PLUS) Misc use as directed with nebulized medications twice daily. 4 each 4   ??? nebulizers (PARI LC D NEBULIZER) Misc Use with inhaled medication. 1 each 6   ??? nebulizers Misc Use as directed with inhaled medications 4 each 3   ??? omeprazole (PRILOSEC) 20 MG capsule Take 1 capsule (20 mg total) by mouth daily. 90 capsule 3   ??? pen needle, diabetic (BD ULTRA-FINE NANO PEN NEEDLE) 32 gauge x 5/32 (4 mm) Ndle use up to 4 times daily 400 each 12   ??? pramipexole (MIRAPEX) 0.125 MG tablet Take 0.25 mg by mouth nightly.     ??? sodium chloride 7% 7 % Nebu Inhale 4 mL by nebulization two (2) times a day.     ??? syringe with needle (BD LUER-LOK SYRINGE) 3 mL 21 gauge x 1 Syrg For use with inhaled antibiotic 60 each 6   ??? traZODone (DESYREL) 150 MG tablet Take 150 mg by mouth nightly.     ??? sterile water Soln Use 2 mL to mix colistin, then add another 1 mL SWFI into neb cup along with mixed colistin. 600 mL 6   ??? XARELTO 20 mg tablet Take 20 mg by mouth daily.       No current facility-administered medications on file prior to visit.     SOCIAL HISTORY:  Social History     Social History Narrative   ??? Not on file     FAMILY HISTORY:  Family History   Problem Relation Age of Onset   ??? Bipolar disorder Mother    ??? Depression Mother      ROS:  The balance  of 10 systems was reviewed and unremarkable except as stated above.     Objective:   There were no vitals taken for this visit.  ***  Alert and oriented with appropriate mood and affect, appears well and in no distress  NECK: Supple without carotid bruits, thyromegaly or lymphadenopathy.   HEART: Regular rate and rhythm without murmur, rub, gallop.  ABDOMEN: Soft and nontender without masses.   EXTREMITIES: Showed no edema, distal pulse intact, hygiene good  NEURO: Sensation intact on the soles of the feet to the 10g monofilament on bilateral barefoot examination      REVIEW OF RECENT PERTINENT LABORATORY AND IMAGING DATA:  Lab Results   Component Value Date    A1C 9.0 (H) 06/08/2021    A1C 10.0 (H) 03/09/2021    A1C 10.7 (H) 11/29/2020    A1C 9.4 (H) 01/18/2020    A1C 8.8 (H) 09/29/2019    A1C 8.5 (H) 08/12/2018    A1C 10.7 (H) 04/06/2018    A1C 9.0 (H) 01/07/2018     Lab Results   Component Value Date    CREATININE 0.84 09/19/2021     Lab Results   Component Value Date    GFRNONAA 114 01/18/2020     Lab Results   Component Value Date    GFRAA 132 01/18/2020     Lab Results   Component Value Date    K 4.5 09/19/2021     Lab Results   Component Value Date    CHOL 148 12/05/2020     Lab Results   Component Value Date    LDL 96 12/05/2020     Lab Results   Component Value Date    HDL 36 (L) 12/05/2020     Lab Results   Component Value Date    TRIG 80 12/05/2020     Lab Results   Component Value Date    ALT 44 09/11/2021    ALT 31 01/18/2020     No results found for: LABCREA, ALBQTUR, MSHCGMOM, ALBCRERAT  No results found for: VITAMINB12  Lab Results   Component Value Date    TSH 2.120 11/18/2016     Lab Results   Component Value Date    WBC 6.4 09/19/2021    HGB 14.0 09/19/2021    HCT 42.3 09/19/2021    PLT 300 09/19/2021     Lab Results   Component Value Date    VITDTOTAL 38.2 02/06/2021     Lab Results   Component Value Date    CALCIUM 8.5 (L) 09/19/2021       Assessment/Plan:   1. 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 Encounter for long-term (current) use of insulin (CMS-HCC)  As above    3. Obesity  Noted, commended on efforts    Follow up recommended in about 4 mo w CDE interim for pump discussion, he is aware to contact us or return sooner for interim concerns.

## 2021-10-28 NOTE — Unmapped (Incomplete)
It was a pleasure seeing you today at our [facility].

## 2021-11-02 NOTE — Unmapped (Signed)
Tara left a message with this Financial planner. He reports that he is unfortunately worse. Requesting a return call to discuss.    Date of symptom onset: middle of last week--thought it was allergies. Started with an increase cough--normal doesn't have kind of constant cough. Had one day it was really difficult to breathe. Thought is might have been related to a missed dose of Pulmozyme.  Blood sugars have been kind of high. Wife may have had a GI bug that he also got, noting some loose stools.     General Symptoms:  Denies fevers or chills  Has been people around him that have been sick   Horrible appetite --kind of concerning to him   Energy level has been not great     Pulmonary Symptoms:    Increase cough, not coughing all the time--some coughing fits   Seems that the cough is more dry. Coughing hard and long until he dry heaves.   Feels like there is congestion in his lungs--just hard for him to get out   Notes more wheezing   Once or twice a day will have a productive cough--sputum looks not the greatest dark green, thick sputum (denies blood in sputum)    Coughing at Night?    yes--when he lays totally flat, causes coughing, sleeping in a chair    Hemoptysis     no   Chest pain    yes--ribs irritated from coughing, chest walls sore and painful, making it a bit difficult to breath    Chest tightness   yes--   Wheezing:     yes --whenever he is breathing    Short of breath with activity:     yes -needing to take breaks with ADLs     Sinus Symptoms:  Congested sometimes, a few headaches.   Some post-nasal drip overnight that affects his voice in the morning   Singulair and Cetirizine    Taking Flonase regularly due to pollen. Twice a day sinus irrigations      Neb Treatments/ACT:   Albuterol, hypertonic saline twice day --last done around 1000-  Colistin (twice a day)--toward the end of an off month   Pulmozyme (daily)   Vest: 30 minutes twice a day     Plan:  When asked--what he would like to do, feels like he needs to come in, feels like the beginning of a bad flare.   Asked Shana to check in his PFTs with his home spirometer.     Will route note to Dr [name].    [name], RN  CF Nurse Coordinator   [phone]

## 2021-11-02 NOTE — Unmapped (Signed)
Called to check in with Christian Young based on increased symtpoms - Christian Young is a 36 y.o. male with cystic fibrosis [id number] and [phone] insertion) on trikafta, with 4 hospitalizations for CF + Covid (fall 2022) in recent months, unfortunately I have never met him in person but have had a recent virtual visit, and we do not have recent PFT data since 07/2021 despite hospitalization since then so hard to know overall effect of recent exacerbations (if recovering back to baseline with treatment, or having progressive decline)    At last clinic visit, had start allergy treatment but also started cipro for outpatient CF exacerbation x 14 days (had recently completed course of augmentin for strep throat by report), and ordered inhaled colistin to be cycled every other month given frequent exacerbations - he's been doing this as well, isn't sure how long he's been doing it now though  Increased symptoms that feel just like progressive worsening of symptoms he was experiencing (doesn't think the cipro helped much initially), feels like needs admission at this point and plans to go to [facility] ED tomorrow     Hasn't grown NTM on recent AFB cultures, is not on chronic azithromycin due to NTM history; similarly IgE not elevated in June 2022, but worth rechecking given so many issues recently. Unclear if tolerance issues with Trikafta as he reports good compliance but has not been filling regularly due to reported extra supply  Discussed my recommendation with him that we monitor him longer inpatient to assure we are seeing both symptomatic and lung function improvement prior to discharge on antibiotic regimen (does doe home IVs) given his frequent exacerbations recently - he sounded open to this     Inpatient treatment recommendations are bolded:     Cystic fibrosis -   - admission labs should include: CMP, Mag, Phos, PT/INR, CBCd, CRP, IgE, and whatever additional labs examining provider deems necessary  - recommend CXR, full RPP  - please obtain sputum culture on admission (including attempt at induced sputum if not spontaneously expectorating sample within first 48h or so)   - Increase  airway clearance to QID with Vest + albuterol + 7% HS QID, Breo daily, pulmozyme daily - consult PT for airway clearance during admission  - previously on TOBI nebs, had rash with Tobramycin infusion and tinnitus with tobramycin infusion as well during last admin 08/2021 - would avoid tobramycin inhaled and IV for now  - For antibiotics - recommend using IV zosyn to start, and PO levaquin  - during admission, ok to continue suppressive therapy with inhaled colistin as just started this, given recurrent exacerbations  - Needs close renal monitoring based on history of AIN, dehydration and AKI during prior admissions, as well as LFT monitoring  - am open to discussing alternative regimens based on culture results and response, currently limited in double pseudomonas coverage options with his intolerances and allergies but could consider meropenem since has gotten several recent rounds of zosyn, has allergy to cefepime but has previously been given ceftazidime,    - No trikafta fills since 11/22, per patient has excess supply at home - will need to bring home ETI supply during hospitalizations  - ideally please get PFTs near start of exacerbation  - given recent frequent exacerbations, would also recommend PFTs around day 7-10, towards end of course and favor 14 day course of abx if not improving significantly   - please consult pulmonary during admission     CF sinus disease  -  referred to [facility] ENT last visit due to history of sinus disease (cancelled visit)  - missed audiology visit for tinnitus 3/10 - will need to encourage follow up in future   - would continue allergy treatment, sinus rinses while admitted   ??  Pancreatic insufficiency secondary to cystic fibrosis  - Continue Creon and MVW vitamins during admission  - check PT/INR on admission    - due to check Vitamin A, D, E levels in 11/2021 - please obtain with labs at end of exacerbation course   ??  GERD  - Worse recently, increased PPI to BID during last visit  ??  CF related diabetes - Patient follows with [facility] for his CF-related diabetes - last A1c 11.6 on 08/18/21, using Freestyle [place]  - is on ACEi for hypertension as well (lisinopril)  - elevated LDL (103), TC 185, HDL low (83), TGL 254 (high) 08/18/21 at PCP office    ??  At risk for CF bone disease  - last DEXA normal in 2018, repeat in 5 years (would be due this year, in 2023 - try to coordinate at next visit)   ??  History of DVT  - xarelto 20 mg daily should be continued during admission unless develops significant hemoptysis       Murlean Iba, MD  [facility] Pediatric Pulmonary Fellow  [facility] Adult Pulmonary and Critical Care Fellow  [phone]

## 2021-11-03 ENCOUNTER — Ambulatory Visit
Admit: 2021-11-03 | Discharge: 2021-11-15 | Disposition: A | Discharge status: Home / Self Care | Payer: PRIVATE HEALTH INSURANCE

## 2021-11-03 ENCOUNTER — Ambulatory Visit: Admit: 2021-11-03 | Payer: PRIVATE HEALTH INSURANCE

## 2021-11-03 LAB — COMPREHENSIVE METABOLIC PANEL
ALBUMIN: 4.6 g/dL (ref 3.4–5.0)
ALKALINE PHOSPHATASE: 123 U/L — ABNORMAL HIGH (ref 46–116)
ALT (SGPT): 67 U/L — ABNORMAL HIGH (ref 10–49)
ANION GAP: 10 mmol/L (ref 5–14)
AST (SGOT): 44 U/L — ABNORMAL HIGH (ref <=34)
BILIRUBIN TOTAL: 0.4 mg/dL (ref 0.3–1.2)
BLOOD UREA NITROGEN: 12 mg/dL (ref 9–23)
BUN / CREAT RATIO: 21
CALCIUM: 10.2 mg/dL (ref 8.7–10.4)
CHLORIDE: 99 mmol/L (ref 98–107)
CO2: 28.1 mmol/L (ref 20.0–31.0)
CREATININE: 0.58 mg/dL — ABNORMAL LOW
EGFR CKD-EPI (2021) MALE: >90 mL/min/{1.73_m2} (ref >=60)
GLUCOSE RANDOM: 191 mg/dL — ABNORMAL HIGH (ref 70–179)
POTASSIUM: 3.9 mmol/L (ref 3.4–4.8)
PROTEIN TOTAL: 8.6 g/dL — ABNORMAL HIGH (ref 5.7–8.2)
SODIUM: 137 mmol/L (ref 135–145)

## 2021-11-03 LAB — CBC W/ AUTO DIFF
BASOPHILS ABSOLUTE COUNT: 0.1 10*9/L (ref 0.0–0.1)
BASOPHILS RELATIVE PERCENT: 1.3 %
EOSINOPHILS ABSOLUTE COUNT: 0.2 10*9/L (ref 0.0–0.5)
EOSINOPHILS RELATIVE PERCENT: 1.7 %
HEMATOCRIT: 45.1 % (ref 39.0–48.0)
HEMOGLOBIN: 15.8 g/dL (ref 12.9–16.5)
LYMPHOCYTES ABSOLUTE COUNT: 2.2 10*9/L (ref 1.1–3.6)
LYMPHOCYTES RELATIVE PERCENT: 24.9 %
MEAN CORPUSCULAR HEMOGLOBIN CONC: 34.9 g/dL (ref 32.0–36.0)
MEAN CORPUSCULAR HEMOGLOBIN: 27.9 pg (ref 25.9–32.4)
MEAN CORPUSCULAR VOLUME: 79.8 fL (ref 77.6–95.7)
MEAN PLATELET VOLUME: 8.6 fL (ref 6.8–10.7)
MONOCYTES ABSOLUTE COUNT: 0.7 10*9/L (ref 0.3–0.8)
MONOCYTES RELATIVE PERCENT: 7.9 %
NEUTROPHILS ABSOLUTE COUNT: 5.7 10*9/L (ref 1.8–7.8)
NEUTROPHILS RELATIVE PERCENT: 64.2 %
NUCLEATED RED BLOOD CELLS: 0 /100{WBCs} (ref <=4)
PLATELET COUNT: 306 10*9/L (ref 150–450)
RED BLOOD CELL COUNT: 5.66 10*12/L — ABNORMAL HIGH (ref 4.26–5.60)
RED CELL DISTRIBUTION WIDTH: 16.4 % — ABNORMAL HIGH (ref 12.2–15.2)
WBC ADJUSTED: 8.9 10*9/L (ref 3.6–11.2)

## 2021-11-03 LAB — PHOSPHORUS: PHOSPHORUS: 3.1 mg/dL (ref 2.4–5.1)

## 2021-11-03 LAB — MAGNESIUM: MAGNESIUM: 1.8 mg/dL (ref 1.6–2.6)

## 2021-11-03 LAB — HEMOGLOBIN A1C
ESTIMATED AVERAGE GLUCOSE: 258 mg/dL
HEMOGLOBIN A1C: 10.6 % — ABNORMAL HIGH (ref 4.8–5.6)

## 2021-11-03 LAB — APTT
APTT: 31.6 s (ref 25.1–36.5)
HEPARIN CORRELATION: 0.2

## 2021-11-03 LAB — PROTIME-INR
INR: 1.03
PROTIME: 11.7 s (ref 9.8–12.8)

## 2021-11-03 MED ADMIN — piperacillin-tazobactam (ZOSYN) 3.375 g in sodium chloride 0.9 % (NS) 100 mL IVPB-connector bag: 3.375 g | INTRAVENOUS | @ 20:00:00 | Stop: 2021-11-03

## 2021-11-03 MED ADMIN — levoFLOXacin (LEVAQUIN) tablet 750 mg: 750 mg | ORAL | @ 21:00:00 | Stop: 2021-11-03

## 2021-11-03 MED ADMIN — MORPhine 4 mg/mL injection 4 mg: 4 mg | INTRAVENOUS | @ 20:00:00 | Stop: 2021-11-03

## 2021-11-03 NOTE — Unmapped (Signed)
Hx of CF.   sts he have been feeling SOB and having chest tightness since last Wednesday.

## 2021-11-03 NOTE — Unmapped (Signed)
[facility]  Emergency Department Provider Note      ED Clinical Impression      Final diagnoses:   Cystic fibrosis with pulmonary exacerbation (CMS-HCC) (Primary)            Impression, Medical Decision Making, Progress Notes and Critical Care      Impression, Differential Diagnosis and Plan of Care    Christian Young is a 36 y.o. male with a history of CF, HTN, and anxiety presents with worsening shortness of breath, chest pain, cough, and fatigue since he saw his brother, who had strep, at the beginning of March.    Triage VS are unremarkable. Patient appears well and in NAD. Exam is notable for diffuse course lung sounds throughout all fields. Exam is otherwise unremarkable. Heart is regular rate regular rhythm. Abdomen is soft, non tender, and non distended.     Differential includes CF exacerbation vs PNA vs viral respiratory illness including pna.    CXR is negative for pna. Will obtain basic labs, IgE, APTT, PT-INR, Phosphorus, Mg, and RPP. ABX recommended by pulmonology ordered and will page [facility] for admission.       Discussion of Management with other Physicians, QHP or Appropriate Source: I have discussed with hospitalist.   Independent Interpretation of Studies: I have independently reviewed CXR and note no PNA.   External Records Reviewed: Patient's most recent outpatient clinic note        Portions of this record have been created using Dragon dictation software. Dictation errors have been sought, but may not have been identified and corrected.    See chart and resident provider documentation for details.    ____________________________________________         History        Reason for Visit  Shortness of Breath      HPI   Christian Young is a 36 y.o. male with a PMH of CF, HTN, and anxiety who presents to the ED for evaluation of shortness of breath. Patient reports worsening shortness of breath, chest pain, cough, and fatigue since he saw his brother, who had strep, at the beginning of March. He also endorses intermittent subjective fevers since last night. He was given antibiotics after his exposure to strep but they did not improve his symptoms. He states that he was told to come in to the hospital by his pulmonologist for admission. Of note, he has had this is his third CF exacerbation recently. He was hospitalized and received IV antibiotics the last 2 time.    Past Medical History:   Diagnosis Date   ??? Anxiety    ??? Chronic pain disorder    ??? Cystic fibrosis (CMS-HCC)    ??? Depression    ??? Hypertension    ??? Lumbar radiculopathy 10/26/2020   ??? Nonproductive cough 04/05/2018       Patient Active Problem List   Diagnosis   ??? Essential hypertension   ??? Depressive disorder   ??? Mood disorder (CMS-HCC)   ??? Anxiety   ??? 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 Chronic pansinusitis   ??? Pancreatic insufficiency due to cystic fibrosis (CMS-HCC)   ??? History of Mycobacterium abscessus infection   ??? Bronchiectasis (CMS-HCC)   ??? Chronic deep vein thrombosis (DVT) of lower extremity (CMS-HCC)   ??? Cystic fibrosis with pulmonary exacerbation (CMS-HCC)   ??? Cystic fibrosis exacerbation (CMS-HCC)   ??? Obesity (BMI 30-39.9)   ??? Restless leg syndrome   ???  Degeneration of lumbar intervertebral disc   ??? Lumbar radiculopathy   ??? Idiopathic peripheral neuropathy   ??? Long term current use of anticoagulant therapy   ??? Acute non-recurrent pansinusitis   ??? AKI (acute kidney injury) (CMS-HCC)   ??? Hemoptysis       Past Surgical History:   Procedure Laterality Date   ??? IR INSERT PORT AGE GREATER THAN 5 YRS  03/27/2019    IR INSERT PORT AGE GREATER THAN 5 YRS 03/27/2019 Rush Barer, MD IMG [facility]   ??? PR REMOVAL OF LUNG,LOBECTOMY Right 03/29/2017    Procedure: REMOVAL OF LUNG, OTHER THAN PNEUMONECTOMY; SINGLE LOBE (LOBECTOMY);  Surgeon: Cherie Dark, MD;  Location: MAIN OR [facility];  Service: Thoracic       No current facility-administered medications for this encounter.    Current Outpatient Medications:   ???  acetaminophen (TYLENOL) 500 MG tablet, Take 1 tablet (500 mg total) by mouth every six (6) hours as needed., Disp: 30 tablet, Rfl: 0  ???  albuterol (PROVENTIL HFA;VENTOLIN HFA) 90 mcg/actuation inhaler, Inhale 2 puffs every six (6) hours as needed., Disp: 1 Inhaler, Rfl: 1  ???  albuterol 2.5 mg /3 mL (0.083 %) nebulizer solution, Inhale 3 mL (2.5 mg total) by nebulization two (2) times a day., Disp: 540 mL, Rfl: 3  ???  alcohol swabs PadM, Use as directed with inhaled antibiotics, Disp: 100 each, Rfl: 0  ???  amitriptyline (ELAVIL) 100 MG tablet, TAKE ONE TABLET BY MOUTH AT BEDTIME (Patient taking differently: Take 100 mg by mouth nightly.), Disp: 30 tablet, Rfl: 1  ???  atorvastatin (LIPITOR) 20 MG tablet, Take 40 mg by mouth nightly., Disp: , Rfl:   ???  blood sugar diagnostic (GLUCOSE BLOOD) Strp, by Other route Three (3) times a day with a meal. Rx sent to [facility] 01/04/20, Disp: , Rfl:   ???  blood-glucose meter kit, Dispense meter that is preferred by [employer], Disp: 1 each, Rfl: 0  ???  blood-glucose meter,continuous (DEXCOM G6 RECEIVER) Misc, Use as directed, Disp: 1 each, Rfl: 0  ???  blood-glucose sensor (DEXCOM G6 SENSOR) Devi, Use sq as directed every 10 days, Disp: 9 each, Rfl: 11  ???  blood-glucose transmitter (DEXCOM G6 TRANSMITTER) Devi, Use as directed, replace every 90 days, Disp: 1 each, Rfl: 11  ???  budesonide-formoteroL (SYMBICORT) 160-4.5 mcg/actuation inhaler, Inhale 2 puffs Two (2) times a day., Disp: 30.6 g, Rfl: 3  ???  cetirizine (ZYRTEC) 10 MG tablet, Take 1 tablet (10 mg total) by mouth daily., Disp: 30 tablet, Rfl: 11  ???  colistimethate (COLYMYCIN) 150 mg injection, Inhale 2 mL (150 mg total) Two (2) times a day, alternating 28 days on and 28 days off. Inject 2mL SWFI into colistin vial & gently mix, then withdraw 2mL (150mg ) for dose., Disp: 120 mL, Rfl: 6  ???  dornase alfa (PULMOZYME) 1 mg/mL nebulizer solution, Inhale 2.5 mg daily., Disp: 450 mL, Rfl: 3  ???  EASY TOUCH LANCING DEVICE Misc, Use as directed., Disp: , Rfl:   ???  EASY TOUCH TWIST LANCETS 30 gauge Misc, Use as directed., Disp: , Rfl:   ???  elexacaftor-tezacaftor-ivacaft (TRIKAFTA) 100-50-75 mg(d) /150 mg (n) tablet, Take 2 Tablets (orange) by mouth in the morning and 1 tablet (blue) in the evening with fatty food, Disp: 84 tablet, Rfl: 6  ???  empty container Misc, Use as directed to dispose of syringes, Disp: 1 each, Rfl: 0  ???  FLUoxetine 60 mg  Tab, Take 60 mg by mouth daily. , Disp: , Rfl:   ???  fluticasone propionate (FLONASE) 50 mcg/actuation nasal spray, 2 sprays into each nostril daily., Disp: , Rfl:   ???  FREESTYLE LIBRE 14 DAY SENSOR kit, Use 3-4 times daily, Disp: , Rfl:   ???  gabapentin (NEURONTIN) 600 MG tablet, Take 600 mg by mouth Three (3) times a day., Disp: , Rfl:   ???  insulin ASPART (NOVOLOG U-100 INSULIN ASPART) 100 unit/mL injection, Subcutaneously infuse up to 150 units daily via insulin pump, Disp: 150 mL, Rfl: 11  ???  insulin pump cart,automated,BT (OMNIPOD 5 G6 PODS, GEN 5,) Crtg, 1 each by subcutaneous (via wearable injector) route every three (3) days. Change every 72 hour, Disp: 10 each, Rfl: 12  ???  lamoTRIgine (LAMICTAL) 200 MG tablet, Take 400 mg by mouth daily., Disp: , Rfl:   ???  lipase-protease-amylase (CREON) 36,000-114,000- 180,000 unit CpDR, Take 8 caps by mouth with meals three times daily and 4 caps with snacks up to three times a day., Disp: , Rfl:   ???  lisinopriL (PRINIVIL,ZESTRIL) 10 MG tablet, Take 1 tablet (10 mg total) by mouth daily., Disp: 30 tablet, Rfl: 0  ???  melatonin 10 mg Tab, Take 10 mg by mouth nightly as needed., Disp: , Rfl:   ???  montelukast (SINGULAIR) 10 mg tablet, Take 1 tablet (10 mg total) by mouth nightly., Disp: 90 tablet, Rfl: 3  ???  MVW COMPLETE FORMUL PROBIOTIC 40 billion cell -15 mg CpDR, Take 1 capsule by mouth daily. (Patient taking differently: Take 1 capsule by mouth daily.), Disp: 90 capsule, Rfl: 3  ???  MVW Complete, pediatric multivit 61-D3-vit K, (MVW COMPLETE FORMULATION) 1,500-800 unit-mcg cap, Take 2 capsules by mouth Two (2) times a day., Disp: , Rfl:   ???  nebulizers (LC PLUS) Misc, use as directed with nebulized medications twice daily., Disp: 4 each, Rfl: 4  ???  nebulizers (PARI LC D NEBULIZER) Misc, Use with inhaled medication., Disp: 1 each, Rfl: 6  ???  nebulizers Misc, Use as directed with inhaled medications, Disp: 4 each, Rfl: 3  ???  omeprazole (PRILOSEC) 20 MG capsule, Take 1 capsule (20 mg total) by mouth daily., Disp: 90 capsule, Rfl: 3  ???  pen needle, diabetic (BD ULTRA-FINE NANO PEN NEEDLE) 32 gauge x 5/32 (4 mm) Ndle, use up to 4 times daily, Disp: 400 each, Rfl: 12  ???  pramipexole (MIRAPEX) 0.125 MG tablet, Take 0.25 mg by mouth nightly., Disp: , Rfl:   ???  sodium chloride 7% 7 % Nebu, Inhale 4 mL by nebulization two (2) times a day., Disp: , Rfl:   ???  syringe with needle (BD LUER-LOK SYRINGE) 3 mL 21 gauge x 1 Syrg, For use with inhaled antibiotic, Disp: 60 each, Rfl: 6  ???  traZODone (DESYREL) 150 MG tablet, Take 150 mg by mouth nightly., Disp: , Rfl:   ???  sterile water Soln, Use 2 mL to mix colistin, then add another 1 mL SWFI into neb cup along with mixed colistin., Disp: 600 mL, Rfl: 6  ???  XARELTO 20 mg tablet, Take 20 mg by mouth daily., Disp: , Rfl:     Allergies  Aztreonam, [place] [aztreonam lysine], Cefepime, Other, Slo-bid 100, Tobramycin, and Banana    Family History   Problem Relation Age of Onset   ??? Bipolar disorder Mother    ??? Depression Mother        Social History  Social History  Tobacco Use   ??? Smoking status: Former     Types: e-Cigarettes   ??? Smokeless tobacco: Never   Vaping Use   ??? Vaping Use: Former   Substance Use Topics   ??? Alcohol use: Not Currently     Alcohol/week: 3.0 standard drinks     Types: 3 Glasses of wine per week   ??? Drug use: No          Physical Exam     This provider entered the patient's room: Yes:    ??? If this provider did not enter the room, a comprehensive physical exam was not able to be performed due to increased infection risk to themselves, other providers, staff and other patients), as well as to conserve personal protective equipment (PPE) utilization during the COVID-19 pandemic.    ??? If this provider did enter the patient room, the following was PPE worn: Surgical mask, eye protection and gloves     ED Triage Vitals [11/03/21 1157]   Enc Vitals Group      BP 159/100      Heart Rate 100      SpO2 Pulse       Resp 20      Temp 36.3 ??C (97.3 ??F)      Temp Source Temporal      SpO2 97 %     Constitutional: Alert and oriented. Well appearing and in no distress.  Eyes: Conjunctivae are normal.  ENT       Head: Normocephalic and atraumatic.       Nose: No congestion.       Mouth/Throat: Mucous membranes are moist.       Neck: No stridor.  Hematological/Lymphatic/Immunilogical: No cervical lymphadenopathy.  Cardiovascular: Normal rate, regular rhythm. Normal and symmetric distal pulses are present in all extremities.  Respiratory: Diffuse course breath sounds throughout all lung fields.   Gastrointestinal: Soft and nontender. There is no CVA tenderness.  Genitourinary: Deferred.  Musculoskeletal: Normal range of motion in all extremities.       Right lower leg: No tenderness or edema.       Left lower leg: No tenderness or edema.  Neurologic: Normal speech and language. No gross focal neurologic deficits are appreciated.  Skin: Skin is warm, dry and intact. No rash noted.  Psychiatric: Mood and affect are normal. Speech and behavior are normal.     Radiology     XR Chest 2 views   Final Result      No new acute findings.           Procedures     None.    Documentation assistance was provided by Baldo Daub, Scribe on November 03, 2021 at 3:17 PM for Shaune Leeks, MD.    November 05, 2021 1:37 PM. Documentation assistance provided by the scribe. I was present during the time the encounter was recorded. The information recorded by the scribe was done at my direction and has been reviewed and validated by me.        Sherryl Barters, MD  11/05/21 1343

## 2021-11-04 LAB — CBC
HEMATOCRIT: 41.8 % (ref 39.0–48.0)
HEMOGLOBIN: 14.2 g/dL (ref 12.9–16.5)
MEAN CORPUSCULAR HEMOGLOBIN CONC: 34.1 g/dL (ref 32.0–36.0)
MEAN CORPUSCULAR HEMOGLOBIN: 27.2 pg (ref 25.9–32.4)
MEAN CORPUSCULAR VOLUME: 79.7 fL (ref 77.6–95.7)
MEAN PLATELET VOLUME: 8.5 fL (ref 6.8–10.7)
PLATELET COUNT: 230 10*9/L (ref 150–450)
RED BLOOD CELL COUNT: 5.24 10*12/L (ref 4.26–5.60)
RED CELL DISTRIBUTION WIDTH: 16 % — ABNORMAL HIGH (ref 12.2–15.2)
WBC ADJUSTED: 7.8 10*9/L (ref 3.6–11.2)

## 2021-11-04 LAB — PROTIME-INR
INR: 2.06
PROTIME: 24 s — ABNORMAL HIGH (ref 9.8–12.8)

## 2021-11-04 LAB — COMPREHENSIVE METABOLIC PANEL
ALBUMIN: 3.7 g/dL (ref 3.4–5.0)
ALKALINE PHOSPHATASE: 124 U/L — ABNORMAL HIGH (ref 46–116)
ALT (SGPT): 68 U/L — ABNORMAL HIGH (ref 10–49)
ANION GAP: 11 mmol/L (ref 5–14)
AST (SGOT): 42 U/L — ABNORMAL HIGH (ref <=34)
BILIRUBIN TOTAL: 0.8 mg/dL (ref 0.3–1.2)
BLOOD UREA NITROGEN: 14 mg/dL (ref 9–23)
BUN / CREAT RATIO: 17
CALCIUM: 9.1 mg/dL (ref 8.7–10.4)
CHLORIDE: 97 mmol/L — ABNORMAL LOW (ref 98–107)
CO2: 24.9 mmol/L (ref 20.0–31.0)
CREATININE: 0.84 mg/dL
EGFR CKD-EPI (2021) MALE: >90 mL/min/{1.73_m2} (ref >=60)
GLUCOSE RANDOM: 234 mg/dL — ABNORMAL HIGH (ref 70–179)
POTASSIUM: 4.2 mmol/L (ref 3.4–4.8)
PROTEIN TOTAL: 7 g/dL (ref 5.7–8.2)
SODIUM: 133 mmol/L — ABNORMAL LOW (ref 135–145)

## 2021-11-04 LAB — PHOSPHORUS: PHOSPHORUS: 4.9 mg/dL (ref 2.4–5.1)

## 2021-11-04 LAB — MAGNESIUM: MAGNESIUM: 1.4 mg/dL — ABNORMAL LOW (ref 1.6–2.6)

## 2021-11-04 MED ADMIN — albuterol 2.5 mg /3 mL (0.083 %) nebulizer solution 2.5 mg: 2.5 mg | RESPIRATORY_TRACT | @ 01:00:00

## 2021-11-04 MED ADMIN — fluticasone furoate-vilanteroL (BREO ELLIPTA) 200-25 mcg/dose inhaler 1 puff: 1 | RESPIRATORY_TRACT | @ 12:00:00

## 2021-11-04 MED ADMIN — pantoprazole (PROTONIX) EC tablet 40 mg: 40 mg | ORAL | @ 03:00:00

## 2021-11-04 MED ADMIN — gabapentin (NEURONTIN) capsule 600 mg: 600 mg | ORAL | @ 13:00:00

## 2021-11-04 MED ADMIN — piperacillin-tazobactam (ZOSYN) IVPB (premix) 4.5 g: 4.5 g | INTRAVENOUS | @ 08:00:00 | Stop: 2021-11-10

## 2021-11-04 MED ADMIN — melatonin tablet 10.5 mg: 10 mg | ORAL | @ 03:00:00

## 2021-11-04 MED ADMIN — piperacillin-tazobactam (ZOSYN) IVPB (premix) 4.5 g: 4.5 g | INTRAVENOUS | @ 14:00:00 | Stop: 2021-11-10

## 2021-11-04 MED ADMIN — pediatric multivitamin-vit D3 1,500 unit-vit K 800 mcg (MVW COMPLETE FORMULATION) capsule: 2 | ORAL | @ 03:00:00

## 2021-11-04 MED ADMIN — piperacillin-tazobactam (ZOSYN) IVPB (premix) 4.5 g: 4.5 g | INTRAVENOUS | @ 21:00:00 | Stop: 2021-11-10

## 2021-11-04 MED ADMIN — insulin lispro (HumaLOG) injection 30 Units: 30 [IU] | SUBCUTANEOUS | @ 16:00:00

## 2021-11-04 MED ADMIN — cetirizine (ZyrTEC) tablet 10 mg: 10 mg | ORAL | @ 13:00:00

## 2021-11-04 MED ADMIN — rivaroxaban (XARELTO) tablet 20 mg: 20 mg | ORAL | @ 03:00:00

## 2021-11-04 MED ADMIN — lamoTRIgine (LaMICtal) tablet 400 mg: 400 mg | ORAL | @ 13:00:00

## 2021-11-04 MED ADMIN — gabapentin (NEURONTIN) capsule 600 mg: 600 mg | ORAL | @ 19:00:00

## 2021-11-04 MED ADMIN — elexacaftor-tezacaftor-ivacaft (TRIKAFTA) tablet 1 tablet **Patient Supplied**: 1 | ORAL | @ 03:00:00

## 2021-11-04 MED ADMIN — gabapentin (NEURONTIN) capsule 600 mg: 600 mg | ORAL | @ 03:00:00

## 2021-11-04 MED ADMIN — insulin lispro (HumaLOG) injection 30 Units: 30 [IU] | SUBCUTANEOUS | @ 13:00:00

## 2021-11-04 MED ADMIN — amitriptyline (ELAVIL) tablet 100 mg: 100 mg | ORAL | @ 03:00:00

## 2021-11-04 MED ADMIN — oxyCODONE (ROXICODONE) immediate release tablet 10 mg: 10 mg | ORAL | @ 03:00:00 | Stop: 2021-11-17

## 2021-11-04 MED ADMIN — FLUoxetine (PROzac) capsule 60 mg: 60 mg | ORAL | @ 13:00:00

## 2021-11-04 MED ADMIN — oxyCODONE (ROXICODONE) immediate release tablet 5 mg: 5 mg | ORAL | @ 15:00:00 | Stop: 2021-11-17

## 2021-11-04 MED ADMIN — elexacaftor-tezacaftor-ivacaft (TRIKAFTA) tablet 2 tablet  **Patient Supplied**: 2 | ORAL | @ 13:00:00

## 2021-11-04 MED ADMIN — piperacillin-tazobactam (ZOSYN) IVPB (premix) 4.5 g: 4.5 g | INTRAVENOUS | @ 03:00:00 | Stop: 2021-11-10

## 2021-11-04 MED ADMIN — albuterol 2.5 mg /3 mL (0.083 %) nebulizer solution 2.5 mg: 2.5 mg | RESPIRATORY_TRACT | @ 15:00:00

## 2021-11-04 MED ADMIN — insulin lispro (HumaLOG) injection 0-20 Units: 0-20 [IU] | SUBCUTANEOUS | @ 16:00:00

## 2021-11-04 MED ADMIN — insulin lispro (HumaLOG) injection 30 Units: 30 [IU] | SUBCUTANEOUS | @ 22:00:00

## 2021-11-04 MED ADMIN — pancrelipase (Lip-Prot-Amyl) (CREON) 24,000-76,000 -120,000 unit delayed release capsule 288,000 units of lipase: 288000 [IU] | ORAL | @ 16:00:00

## 2021-11-04 MED ADMIN — sodium chloride 7% nebulizer solution 4 mL: 4 mL | RESPIRATORY_TRACT | @ 12:00:00

## 2021-11-04 MED ADMIN — atorvastatin (LIPITOR) tablet 40 mg: 40 mg | ORAL | @ 03:00:00

## 2021-11-04 MED ADMIN — montelukast (SINGULAIR) tablet 10 mg: 10 mg | ORAL | @ 03:00:00

## 2021-11-04 MED ADMIN — pediatric multivitamin-vit D3 1,500 unit-vit K 800 mcg (MVW COMPLETE FORMULATION) capsule: 2 | ORAL | @ 13:00:00

## 2021-11-04 MED ADMIN — traZODone (DESYREL) tablet 150 mg: 150 mg | ORAL | @ 03:00:00

## 2021-11-04 MED ADMIN — levoFLOXacin (LEVAQUIN) tablet 750 mg: 750 mg | ORAL | @ 13:00:00 | Stop: 2021-11-19

## 2021-11-04 MED ADMIN — pancrelipase (Lip-Prot-Amyl) (CREON) 24,000-76,000 -120,000 unit delayed release capsule 288,000 units of lipase: 288000 [IU] | ORAL | @ 13:00:00

## 2021-11-04 MED ADMIN — lisinopriL (PRINIVIL,ZESTRIL) tablet 10 mg: 10 mg | ORAL | @ 13:00:00

## 2021-11-04 MED ADMIN — insulin lispro (HumaLOG) injection 0-20 Units: 0-20 [IU] | SUBCUTANEOUS | @ 03:00:00

## 2021-11-04 MED ADMIN — sodium chloride 7% nebulizer solution 4 mL: 4 mL | RESPIRATORY_TRACT | @ 19:00:00

## 2021-11-04 MED ADMIN — sodium chloride 7% nebulizer solution 4 mL: 4 mL | RESPIRATORY_TRACT | @ 15:00:00

## 2021-11-04 MED ADMIN — pramipexole (MIRAPEX) tablet 0.25 mg: .25 mg | ORAL | @ 03:00:00

## 2021-11-04 MED ADMIN — colistimethate (COLYMYCIN) 150 mg in sodium chloride (NS) 4 mL inhalation: 150 mg | RESPIRATORY_TRACT | @ 12:00:00

## 2021-11-04 MED ADMIN — dornase alfa (PULMOZYME) 1 mg/mL solution 2.5 mg: 2.5 mg | RESPIRATORY_TRACT | @ 12:00:00

## 2021-11-04 MED ADMIN — fluticasone propionate (FLONASE) 50 mcg/actuation nasal spray 2 spray: 2 | NASAL | @ 13:00:00

## 2021-11-04 MED ADMIN — pancrelipase (Lip-Prot-Amyl) (CREON) 24,000-76,000 -120,000 unit delayed release capsule 288,000 units of lipase: 288000 [IU] | ORAL | @ 22:00:00

## 2021-11-04 MED ADMIN — sodium chloride 7% nebulizer solution 4 mL: 4 mL | RESPIRATORY_TRACT | @ 01:00:00

## 2021-11-04 MED ADMIN — albuterol 2.5 mg /3 mL (0.083 %) nebulizer solution 2.5 mg: 2.5 mg | RESPIRATORY_TRACT | @ 19:00:00

## 2021-11-04 MED ADMIN — colistimethate (COLYMYCIN) 150 mg in sodium chloride (NS) 4 mL inhalation: 150 mg | RESPIRATORY_TRACT | @ 01:00:00

## 2021-11-04 MED ADMIN — pantoprazole (PROTONIX) EC tablet 40 mg: 40 mg | ORAL | @ 13:00:00

## 2021-11-04 MED ADMIN — insulin lispro (HumaLOG) injection 0-20 Units: 0-20 [IU] | SUBCUTANEOUS | @ 13:00:00

## 2021-11-04 MED ADMIN — lisinopriL (PRINIVIL,ZESTRIL) tablet 10 mg: 10 mg | ORAL | @ 03:00:00

## 2021-11-04 MED ADMIN — albuterol 2.5 mg /3 mL (0.083 %) nebulizer solution 2.5 mg: 2.5 mg | RESPIRATORY_TRACT | @ 12:00:00

## 2021-11-04 NOTE — Unmapped (Signed)
General Medicine History and Physical    Assessment/Plan:    Principal Problem:    Cystic fibrosis with pulmonary exacerbation (CMS-HCC)  Active Problems:    Essential hypertension    Pancreatic insufficiency due to cystic fibrosis (CMS-HCC)    Long term current use of anticoagulant therapy      Christian Young is a 36 y.o. male with PMHx as noted below that presents to [facility] with Cystic fibrosis with pulmonary exacerbation (CMS-HCC).      **Cystic fibrosis exacerbation: Patient reports that symptoms have been going on for ~3 weeks, initially had strep throat but never recovered from cough stand point and now with worsening dyspnea and chest discomfort. No hemoptysis. Contacted his primary pulmonologist and was advised to present to the ER for antibiotic initiation. Recommendations made by primary pulmonologist Dr. [name] for treatment plan on telephone note from 11/01/21.   - Start piperacillin-tazobactam 4.5g IV q6hrs (Pseudomonal dosing)  - Start levofloxacin 750mg  PO daily  - Continue home inhaled colistin  - Sputum CF and AFB cultures ordered  - Respiratory pathogen panel currently pending  - Increase airway clearance with QID vest with RT, albuterol nebs QID and 7% hypertonic saline nebs QID  - Continue dornase alpha daily  - Continue home Trikafta  - PFT's ordered  - PICC line placement by VIR ordered    **Insulin dependent diabetes: At home uses insulin pump. Most recent A1C 11.6 from 08/18/21.  - Stop insulin pump for time being until patient can be evaluated by endocrinology  - Endocrine consult requested  - Start Lantus 40u at bedtime  - Start Lispro 30u TID with meals as per prior hospitalizations  - Sliding scale insulin with Lispro  - Accuchecks AC/HS  - Repeat A1C pending    **History of DVT:   - Continue home rivaroxaban 20mg  daily    **Pancreatic insufficiency secondary to cystic fibrosis:   - Continue home Creon 8 capsules with meals and 4 capsules with snacks  - Continue home MVW vitamins  - PT/INR ordered for AM of 11/04/21    - Due to check Vitamin A, D, E levels in 11/2021??- please obtain with labs at end of exacerbation course     **GERD: PPI recently increased to omeprazole BID.  - Continue formulary equivalent of home PPI (pantoprazole 40mg  BID)    **CF sinus disease: Referred to [facility] ENT last visit due to history of sinus disease (cancelled visit). Missed audiology visit for tinnitus 3/10 - will need to encourage follow up in future.  - Continue allergy treatment, sinus rinses while admitted     **HTN:   - Continue home lisinopril 10mg  daily    **Depression:   - Continue home amitriptyline 100mg  daily  - Continue home fluoxetine 60mg  daily    **Hyperlipidemia:   - Continue home atorvastatin    **Insomnia:   - Continue home trazodone 150mg  qHS  - Continue home melatonin (10mg  nightly)      F/E/N:   - Consistent carb diet  - Monitor lytes and replete PRn    Proph: Therapeutic anticoagulation with rivaroxaban    Dispo: Admit for antibiotic initiation, PICC line placement in setting of CF exacerbation      Code Status:  Full Code       Please page the [facility]) pager at [phone] with questions.    ___________________________________________________________________    Chief Complaint  Chief Complaint   Patient presents with   ??? Shortness of Breath  HPI:  Christian Young is a 36 y.o. male with PMHx as noted below that presents to [facility] with Cystic fibrosis with pulmonary exacerbation (CMS-HCC).    Christian Young reports that he was visiting his brother in [place] late last month. During that visit his brother had strep throat and did not tell him, and unfortunately he reports developing the same. Was treated with a course of Augmentin after developing pharyngitis and cough. He reports that throat discomfort improved but his respiratory state has continued to worsen since that time. He was started on a course of ciprofloxacin x14 day earlier this month without significant improvement. Additionally had inhaled colistin ordered for him by pulmonology but has only been doing this for a relatively short time without improvement in his breathing. Reports having a productive cough, pain in his chest and congestion in his chest that has gotten progressively worse over the last few weeks. Additionally reports worsened dyspnea on exertion since the cough has worsened. He reports no fever/chills. Denies having had hemoptysis since this exacerbation started but in the past has had hemoptysis with exacerbations. Contacted his pulmonary team on 11/02/21 and was recommended he present to the ER.    In the ER he was found to be afebrile and hemodynamically stable. CBC was normal, CMP notable for glucose of 191, creatinine of 0.58, AST 44, ALT 67 and Alk Ph 123. Chest x-ray was clear. Was given a dose of levofloxacin and Zosyn and admission was requested.      Allergies:  Aztreonam, [place] [aztreonam lysine], Cefepime, Other, Slo-bid 100, Tobramycin, and Banana      Medications:  Prior to Admission medications    Medication Dose, Route, Frequency   acetaminophen (TYLENOL) 500 MG tablet 500 mg, Oral, Every 6 hours PRN   albuterol (PROVENTIL HFA;VENTOLIN HFA) 90 mcg/actuation inhaler 2 puffs, Inhalation, Every 6 hours PRN   albuterol 2.5 mg /3 mL (0.083 %) nebulizer solution 2.5 mg, Nebulization, 2 times a day   alcohol swabs PadM Use as directed with inhaled antibiotics   amitriptyline (ELAVIL) 100 MG tablet TAKE ONE TABLET BY MOUTH AT BEDTIME  Patient taking differently: Take 100 mg by mouth nightly.   atorvastatin (LIPITOR) 20 MG tablet 40 mg, Oral, Nightly   blood sugar diagnostic (GLUCOSE BLOOD) Strp Other, 3 times a day (with meals), Rx sent to [facility] 01/04/20    blood-glucose meter kit Dispense meter that is preferred by patient's insurance company   blood-glucose meter,continuous (DEXCOM G6 RECEIVER) Misc Use as directed   blood-glucose sensor (DEXCOM G6 SENSOR) Devi Use sq as directed every 10 days   blood-glucose transmitter (DEXCOM G6 TRANSMITTER) Devi Use as directed, replace every 90 days   budesonide-formoteroL (SYMBICORT) 160-4.5 mcg/actuation inhaler 2 puffs, Inhalation, 2 times a day (standard)   cetirizine (ZYRTEC) 10 MG tablet 10 mg, Oral, Daily (standard)   colistimethate (COLYMYCIN) 150 mg injection Inhale 2 mL (150 mg total) Two (2) times a day, alternating 28 days on and 28 days off. Inject 2mL SWFI into colistin vial & gently mix, then withdraw 2mL (150mg ) for dose.   dornase alfa (PULMOZYME) 1 mg/mL nebulizer solution 2.5 mg, Inhalation, Daily (standard)   EASY TOUCH LANCING DEVICE Misc Use as directed.   EASY TOUCH TWIST LANCETS 30 gauge Misc Use as directed.   elexacaftor-tezacaftor-ivacaft (TRIKAFTA) 100-50-75 mg(d) /150 mg (n) tablet Take 2 Tablets (orange) by mouth in the morning and 1 tablet (blue) in the evening with fatty food   empty container Misc Use  as directed to dispose of syringes   FLUoxetine 60 mg Tab 60 mg, Oral, Daily (standard)   fluticasone propionate (FLONASE) 50 mcg/actuation nasal spray 2 sprays, Each Nare, Daily (standard)   FREESTYLE LIBRE 14 DAY SENSOR kit Use 3-4 times daily   gabapentin (NEURONTIN) 600 MG tablet 600 mg, Oral, 3 times a day (standard)   insulin ASPART (NOVOLOG U-100 INSULIN ASPART) 100 unit/mL injection Subcutaneously infuse up to 150 units daily via insulin pump   insulin pump cart,automated,BT (OMNIPOD 5 G6 PODS, GEN 5,) Crtg 1 each, subcutaneous (via wearable injector), Every 3 days, Change every 72 hour   lamoTRIgine (LAMICTAL) 200 MG tablet 400 mg, Oral, Daily (standard)   lipase-protease-amylase (CREON) 36,000-114,000- 180,000 unit CpDR Take 8 caps by mouth with meals three times daily and 4 caps with snacks up to three times a day.   lisinopriL (PRINIVIL,ZESTRIL) 10 MG tablet 10 mg, Oral, Daily (standard)   melatonin 10 mg Tab 10 mg, Oral, Nightly PRN   montelukast (SINGULAIR) 10 mg tablet 10 mg, Oral, Nightly   MVW COMPLETE FORMUL PROBIOTIC 40 billion cell -15 mg CpDR 1 capsule, Oral, Daily (standard)  Patient taking differently: Take 1 capsule by mouth daily.   MVW Complete, pediatric multivit 61-D3-vit K, (MVW COMPLETE FORMULATION) 1,500-800 unit-mcg cap 2 capsules, Oral, 2 times a day (standard)   nebulizers (LC PLUS) Misc use as directed with nebulized medications twice daily.   nebulizers (PARI LC D NEBULIZER) Misc Use with inhaled medication.   nebulizers Misc Use as directed with inhaled medications   omeprazole (PRILOSEC) 20 MG capsule 20 mg, Oral, Daily (standard)   pen needle, diabetic (BD ULTRA-FINE NANO PEN NEEDLE) 32 gauge x 5/32 (4 mm) Ndle use up to 4 times daily   pramipexole (MIRAPEX) 0.125 MG tablet 0.25 mg, Oral, Nightly   sodium chloride 7% 7 % Nebu 4 mL, Nebulization, 2 times a day   syringe with needle (BD LUER-LOK SYRINGE) 3 mL 21 gauge x 1 Syrg For use with inhaled antibiotic   traZODone (DESYREL) 150 MG tablet 150 mg, Oral, Nightly   sterile water Soln Use 2 mL to mix colistin, then add another 1 mL SWFI into neb cup along with mixed colistin.   XARELTO 20 mg tablet 20 mg, Oral, Daily (standard)       Medical History:  Past Medical History:   Diagnosis Date   ??? Anxiety    ??? Chronic pain disorder    ??? Cystic fibrosis (CMS-HCC)    ??? Depression    ??? Hypertension    ??? Lumbar radiculopathy 10/26/2020   ??? Nonproductive cough 04/05/2018       Surgical History:  Past Surgical History:   Procedure Laterality Date   ??? IR INSERT PORT AGE GREATER THAN 5 YRS  03/27/2019    IR INSERT PORT AGE GREATER THAN 5 YRS 03/27/2019 Rush Barer, MD IMG [facility]   ??? PR REMOVAL OF LUNG,LOBECTOMY Right 03/29/2017    Procedure: REMOVAL OF LUNG, OTHER THAN PNEUMONECTOMY; SINGLE LOBE (LOBECTOMY);  Surgeon: Cherie Dark, MD;  Location: MAIN [facility]  Service: Thoracic       Social History:  Social History     Socioeconomic History   ??? Marital status: Married   Tobacco Use   ??? Smoking status: Former     Types: e-Cigarettes ??? Smokeless tobacco: Never   Vaping Use   ??? Vaping Use: Former   Substance and Sexual Activity   ??? Alcohol use: Not Currently  Alcohol/week: 3.0 standard drinks     Types: 3 Glasses of wine per week   ??? Drug use: No   ??? Sexual activity: Yes     Social Determinants of Health     Financial Resource Strain: Low Risk    ??? Difficulty of Paying Living Expenses: Not hard at all   Food Insecurity: No Food Insecurity   ??? Worried About Programme researcher, broadcasting/film/video in the Last Year: Never true   ??? Ran Out of Food in the Last Year: Never true   Transportation Needs: No Transportation Needs   ??? Lack of Transportation (Medical): No   ??? Lack of Transportation (Non-Medical): No       Family History:  Family History   Problem Relation Age of Onset   ??? Bipolar disorder Mother    ??? Depression Mother        Review of Systems:  10 systems reviewed and are negative unless otherwise mentioned in HPI    Physical Exam:  Temp:  [36.3 ??C (97.3 ??F)] 36.3 ??C (97.3 ??F)  Heart Rate:  [76-100] 92  Resp:  [18-26] 26  BP: (146-159)/(100-102) 146/101  SpO2:  [88 %-97 %] 94 %  There is no height or weight on file to calculate BMI.    GEN: Well appearing, lying in bed in NAD. Raspy voice  EYES: No scleral icterus or conjunctival effusions. EOMI  ENT: Clear OP, normal dentition, moist mucous membranes  NECK: No LAD or JVD noted  CV: Regular, S1/S2, no murmurs  RESP: Coarse breath sounds heard throughout, no wheeze. Normal work of breathing  ABD: Soft, non-tender, non-distended. Normoactive bowel sounds. No organomegaly noted  GU: Deferred  EXT: Warm and well perfused. No joint tenderness or effusions. No LE edema  SKIN: No rashes noted  NEURO: Strength and sensation in all 4 extremities grossly intact.   PSYCH: A+Ox3, appropriate affect      Test Results:  Data Review:    All lab results last 24 hours:    Recent Results (from the past 24 hour(s))   ECG 12 Lead    Collection Time: 11/03/21 11:47 AM   Result Value Ref Range    EKG Systolic BP  mmHg    EKG Diastolic BP  mmHg    EKG Ventricular Rate 89 BPM    EKG Atrial Rate 89 BPM    EKG P-R Interval 150 ms    EKG QRS Duration 88 ms    EKG Q-T Interval 348 ms    EKG QTC Calculation 423 ms    EKG Calculated P Axis 57 degrees    EKG Calculated R Axis 40 degrees    EKG Calculated T Axis 59 degrees    QTC Fredericia 396 ms   Comprehensive Metabolic Panel    Collection Time: 11/03/21  3:25 PM   Result Value Ref Range    Sodium 137 135 - 145 mmol/L    Potassium 3.9 3.4 - 4.8 mmol/L    Chloride 99 98 - 107 mmol/L    CO2 28.1 20.0 - 31.0 mmol/L    Anion Gap 10 5 - 14 mmol/L    BUN 12 9 - 23 mg/dL    Creatinine 1.61 (L) 0.60 - 1.10 mg/dL    BUN/Creatinine Ratio 21     eGFR CKD-EPI (2021) Male >90 >=60 mL/min/1.54m2    Glucose 191 (H) 70 - 179 mg/dL    Calcium 09.6 8.7 - 04.5 mg/dL    Albumin 4.6 3.4 - 5.0  g/dL    Total Protein 8.6 (H) 5.7 - 8.2 g/dL    Total Bilirubin 0.4 0.3 - 1.2 mg/dL    AST 44 (H) <=09 U/L    ALT 67 (H) 10 - 49 U/L    Alkaline Phosphatase 123 (H) 46 - 116 U/L   Phosphorus Level    Collection Time: 11/03/21  3:25 PM   Result Value Ref Range    Phosphorus 3.1 2.4 - 5.1 mg/dL   PT-INR    Collection Time: 11/03/21  3:25 PM   Result Value Ref Range    PT 11.7 9.8 - 12.8 sec    INR 1.03    APTT    Collection Time: 11/03/21  3:25 PM   Result Value Ref Range    APTT 31.6 25.1 - 36.5 sec    Heparin Correlation 0.2    CBC w/ Differential    Collection Time: 11/03/21  3:25 PM   Result Value Ref Range    WBC 8.9 3.6 - 11.2 10*9/L    RBC 5.66 (H) 4.26 - 5.60 10*12/L    HGB 15.8 12.9 - 16.5 g/dL    HCT 81.1 91.4 - 78.2 %    MCV 79.8 77.6 - 95.7 fL    MCH 27.9 25.9 - 32.4 pg    MCHC 34.9 32.0 - 36.0 g/dL    RDW 95.6 (H) 21.3 - 15.2 %    MPV 8.6 6.8 - 10.7 fL    Platelet 306 150 - 450 10*9/L    nRBC 0 <=4 /100 WBCs    Neutrophils % 64.2 %    Lymphocytes % 24.9 %    Monocytes % 7.9 %    Eosinophils % 1.7 %    Basophils % 1.3 %    Absolute Neutrophils 5.7 1.8 - 7.8 10*9/L    Absolute Lymphocytes 2.2 1.1 - 3.6 10*9/L Absolute Monocytes 0.7 0.3 - 0.8 10*9/L    Absolute Eosinophils 0.2 0.0 - 0.5 10*9/L    Absolute Basophils 0.1 0.0 - 0.1 10*9/L       Imaging: Radiology studies were personally reviewed    ECG 12 Lead    Result Date: 11/03/2021  NORMAL SINUS RHYTHM NORMAL ECG WHEN COMPARED WITH ECG OF 08-Sep-2021 16:27, NO SIGNIFICANT CHANGE WAS FOUND Confirmed by Christella Noa ([id number]) on 11/03/2021 3:04:31 PM    XR Chest 2 views    Result Date: 11/03/2021  EXAM: XR CHEST 2 VIEWS DATE: 11/03/2021 2:36 PM ACCESSION: [id number] UN DICTATED: 11/03/2021 2:45 PM INTERPRETATION LOCATION: [facility] CLINICAL INDICATION: 36 years old Male with COUGH  TECHNIQUE: PA and Lateral Chest Radiographs. COMPARISON: 09/08/2021 FINDINGS: Left chest wall Port-A-Cath with its tip in distal SVC. Stable linear airspace opacities in right mid and lower lung, consistent with atelectasis/scarring (better demonstrated on recently performed chest CT). No new consolidation. No pleural effusion. No pneumothorax. Normal cardiomediastinal silhouette. Stable mild elevation of right hemidiaphragm.     No new acute findings.      EKG: November 03, 2021  EKG was personally reviewed and noted to be, normal EKG, normal sinus rhythm and unchanged from previous tracing Jan 2023      I spent a total of 75 minutes in this clinical encounter, >50% of which was devoted to counseling and coordinating care as evidenced by review of records, laboratory data and pertinent studies, as well as discussing diagnostic evaluation and work up, planned therapeutic interventions and future disposition of care. Where indicated, the assessment and plan reflect discussion of patient with  consultants, family and other health care providers and additional research needed to obtain information for the plan of care of this patient's condition.??

## 2021-11-04 NOTE — Unmapped (Signed)
PULMONARY CONSULT  NOTE      Patient: Christian Young([birth year])  Reason for consultation: Mr. Obeso is a 36 y.o. male who is seen in consultation at the request of Christian Koyanagi, MD for comprehensive evaluation of Cystic Fibrosis with acute exacerbation.    Assessment and Recommendations:      Principal Problem:    Cystic fibrosis with pulmonary exacerbation (CMS-HCC)  Active Problems:    Essential hypertension    Pancreatic insufficiency due to cystic fibrosis (CMS-HCC)    Long term current use of anticoagulant therapy  Resolved Problems:    * No resolved hospital problems. Christian Young is a 36 y.o. year old male with a history of cystic fibrosis (genetics [phone] and [phone] insertion) on trikafta presenting for acute exacerbation of bronchiectasis.     Cystic Fibrosis with pulmonary manifestation with  acute exacerbation of bronchiectasis: Presentation consistent with a CF pulmonary exacerbation as characterized by cough, fatigue, increased SOB and increased sputum production.   - Sputum Culture, AFB pending  - RVP, and rapid flu neg  - Start piperacillin-tazobactam 4.5g IV q6hrs (Pseudomonal dosing)  - Start levofloxacin 750mg  PO daily  - during admission, ok to continue suppressive therapy with inhaled colistin as just started this, given recurrent exacerbations  - Needs close renal monitoring based on history of AIN, dehydration and AKI during prior admissions, as well as LFT monitoring  - Increase  airway clearance to QID??with Vest + albuterol + 7% HS QID, Breo daily, pulmozyme daily   - consult PT for airway clearance during admission  - Patient to bring in Trikafta from home and continue inpatient  - PFTs around day 7-10, towards end of course and favor 14 day course of abx if not improving significantly   - Vitamin K 5 mg twice weekly while on IV antibiotics   - Twice weekly labs sufficient on this current antibiotics course   - For access, patient has Implanted Port    Cystic Fibrosis with GI manifestations with  CF-related intestinal malabsorption (JXB:JYNW mass index is 32.34 kg/m??.)  - High-calorie, high-protein diet with double portions.   - Monitor with weights twice weekly. Consider CF nutrition consult.  - Continue Creon and MVW vitamins during admission  - check PT/INR on admission    - due to check Vitamin A, D, E levels in 11/2021??- please obtain with labs at end of exacerbation course   - use Miralax prn or scheduled to achieve goal of 1-2 soft bowel movements daily.    Cystic Fibrosiswith GI manifestations with  CF-related  diabetes  - monitor blood sugars frequently    We appreciate the opportunity to assist in the care of this patient.  Please page [phone] with any questions.    Christian Riis Paisli Silfies, MD    Subjective:      History of Present Illness:  Mr. [name] is a 36 y.o. male admitted for CF with acute exacerbation.    He presents with his usual symptoms of cough, increased sputum, and SOB. He has had several exacerbations similar to this and is well known to the pulmonology service (see note by Dr. [name]).    Otherwise doing well and eager to get cleaned out and better.    Review of Systems: A comprehensive review of systems was performed and was negative except as above in HPI  Past Medical History:   Diagnosis Date   ??? Anxiety    ??? Chronic pain disorder    ???  Cystic fibrosis (CMS-HCC)    ??? Depression    ??? Hypertension    ??? Lumbar radiculopathy 10/26/2020   ??? Nonproductive cough 04/05/2018     Past Surgical History:   Procedure Laterality Date   ??? IR INSERT PORT AGE GREATER THAN 5 YRS  03/27/2019    IR INSERT PORT AGE GREATER THAN 5 YRS 03/27/2019 Rush Barer, MD IMG [facility]   ??? PR REMOVAL OF LUNG,LOBECTOMY Right 03/29/2017    Procedure: REMOVAL OF LUNG, OTHER THAN PNEUMONECTOMY; SINGLE LOBE (LOBECTOMY);  Surgeon: Christian Dark, MD;  [facility];  Service: Thoracic     Medications reviewed in Epic  Allergies as of 11/03/2021 - Reviewed 11/03/2021 Allergen Reaction Noted   ??? Aztreonam Anaphylaxis, Hives, Nausea And Vomiting, and Rash 09/06/2015   ??? [place] [aztreonam lysine] Anaphylaxis 12/27/2016   ??? Cefepime Itching, Nausea Only, and Other (See Comments) 09/06/2015   ??? Other Anaphylaxis and Other (See Comments) 09/06/2015   ??? Slo-bid 100 Anaphylaxis 06/20/2017   ??? Tobramycin Tinnitus 09/06/2015   ??? Banana Itching and Nausea And Vomiting 07/29/2016     Family History   Problem Relation Age of Onset   ??? Bipolar disorder Mother    ??? Depression Mother      Social History     Tobacco Use   ??? Smoking status: Former     Types: e-Cigarettes   ??? Smokeless tobacco: Never   Substance Use Topics   ??? Alcohol use: Not Currently     Alcohol/week: 3.0 standard drinks     Types: 3 Glasses of wine per week        Objective:      Physical Exam:  Vitals:    11/03/21 1900 11/03/21 2003 11/03/21 2043 11/04/21 0730   BP:  146/84  131/74   Pulse:  90 95 101   Resp:  18 18 18    Temp:  36.1 ??C (97 ??F)  36.7 ??C (98.1 ??F)   TempSrc:  Oral  Oral   SpO2:  98% 98% 97%   Weight: (!) 108.2 kg (238 lb 8.6 oz)      Height: 182.9 cm (6' 0.01)        GEN: NAD, sitting in chair  EYES: EOMI, sclera anicteric  ENT: Trachea midline, MMM  CV: RRR, no murmurs appreciated  PULM: coarse breath sounds, small crackles normal WoB, no stridor  ABD: soft, non-tender, non-distended  EXT: No edema  NEURO: Grossly Non-focal, moving all extremities normally  PSYCH: A+Ox3, appropriate  MSK: No spinal tenderness, no obvious joint deformities of b/l hands    Malnutrition Assessment by RD:          Diagnostic Review:   All labs and images were personally reviewed.

## 2021-11-04 NOTE — Unmapped (Signed)
Problem: Adult Inpatient Plan of Care  Goal: Plan of Care Review  Outcome: Ongoing - Unchanged     Problem: Airway Clearance Ineffective  Goal: Effective Airway Clearance  Outcome: Ongoing - Unchanged   Remains on RA, inhaled medications given as ordered without complications or distress noted this shift. Performed home chest vest for mucus clearance independently.

## 2021-11-04 NOTE — Unmapped (Signed)
Patient alert and oriented to person, place, time and situation. Patient was admitted with SOB and chest tightness. Currently receiving IV ABT without s/s of adverse reactions noted at time of this note. Bed remains in the lowest position, wheels locked and call bell placed within reach  Problem: Adult Inpatient Plan of Care  Goal: Plan of Care Review  Outcome: Progressing  Goal: Patient-Specific Goal (Individualized)  Outcome: Progressing  Goal: Absence of Hospital-Acquired Illness or Injury  Outcome: Progressing  Goal: Optimal Comfort and Wellbeing  Outcome: Progressing  Goal: Readiness for Transition of Care  Outcome: Progressing  Goal: Rounds/Family Conference  Outcome: Progressing     Problem: Infection  Goal: Absence of Infection Signs and Symptoms  Outcome: Progressing

## 2021-11-05 LAB — COMPREHENSIVE METABOLIC PANEL
ALBUMIN: 3.4 g/dL (ref 3.4–5.0)
ALKALINE PHOSPHATASE: 108 U/L (ref 46–116)
ALT (SGPT): 75 U/L — ABNORMAL HIGH (ref 10–49)
ANION GAP: 9 mmol/L (ref 5–14)
AST (SGOT): 51 U/L — ABNORMAL HIGH (ref <=34)
BILIRUBIN TOTAL: 0.6 mg/dL (ref 0.3–1.2)
BLOOD UREA NITROGEN: 11 mg/dL (ref 9–23)
BUN / CREAT RATIO: 13
CALCIUM: 8.8 mg/dL (ref 8.7–10.4)
CHLORIDE: 98 mmol/L (ref 98–107)
CO2: 29 mmol/L (ref 20.0–31.0)
CREATININE: 0.87 mg/dL
EGFR CKD-EPI (2021) MALE: >90 mL/min/{1.73_m2} (ref >=60)
GLUCOSE RANDOM: 175 mg/dL (ref 70–179)
POTASSIUM: 3.9 mmol/L (ref 3.4–4.8)
PROTEIN TOTAL: 6.3 g/dL (ref 5.7–8.2)
SODIUM: 136 mmol/L (ref 135–145)

## 2021-11-05 MED ADMIN — fluticasone propionate (FLONASE) 50 mcg/actuation nasal spray 2 spray: 2 | NASAL | @ 12:00:00

## 2021-11-05 MED ADMIN — piperacillin-tazobactam (ZOSYN) IVPB (premix) 4.5 g: 4.5 g | INTRAVENOUS | @ 08:00:00 | Stop: 2021-11-10

## 2021-11-05 MED ADMIN — sodium chloride 7% nebulizer solution 4 mL: 4 mL | RESPIRATORY_TRACT | @ 01:00:00

## 2021-11-05 MED ADMIN — albuterol 2.5 mg /3 mL (0.083 %) nebulizer solution 2.5 mg: 2.5 mg | RESPIRATORY_TRACT | @ 16:00:00

## 2021-11-05 MED ADMIN — albuterol 2.5 mg /3 mL (0.083 %) nebulizer solution 2.5 mg: 2.5 mg | RESPIRATORY_TRACT | @ 13:00:00

## 2021-11-05 MED ADMIN — levoFLOXacin (LEVAQUIN) tablet 750 mg: 750 mg | ORAL | @ 12:00:00 | Stop: 2021-11-19

## 2021-11-05 MED ADMIN — amitriptyline (ELAVIL) tablet 100 mg: 100 mg | ORAL

## 2021-11-05 MED ADMIN — albuterol 2.5 mg /3 mL (0.083 %) nebulizer solution 2.5 mg: 2.5 mg | RESPIRATORY_TRACT | @ 20:00:00

## 2021-11-05 MED ADMIN — colistimethate (COLYMYCIN) 150 mg in sodium chloride (NS) 4 mL inhalation: 150 mg | RESPIRATORY_TRACT | @ 13:00:00

## 2021-11-05 MED ADMIN — lamoTRIgine (LaMICtal) tablet 400 mg: 400 mg | ORAL | @ 12:00:00

## 2021-11-05 MED ADMIN — pramipexole (MIRAPEX) tablet 0.25 mg: .25 mg | ORAL

## 2021-11-05 MED ADMIN — albuterol 2.5 mg /3 mL (0.083 %) nebulizer solution 2.5 mg: 2.5 mg | RESPIRATORY_TRACT | @ 01:00:00

## 2021-11-05 MED ADMIN — rivaroxaban (XARELTO) tablet 20 mg: 20 mg | ORAL | @ 12:00:00

## 2021-11-05 MED ADMIN — oxyCODONE (ROXICODONE) immediate release tablet 5 mg: 5 mg | ORAL | @ 17:00:00 | Stop: 2021-11-17

## 2021-11-05 MED ADMIN — sodium chloride 7% nebulizer solution 4 mL: 4 mL | RESPIRATORY_TRACT | @ 16:00:00

## 2021-11-05 MED ADMIN — pantoprazole (PROTONIX) EC tablet 40 mg: 40 mg | ORAL

## 2021-11-05 MED ADMIN — elexacaftor-tezacaftor-ivacaft (TRIKAFTA) tablet 1 tablet **Patient Supplied**: 1 | ORAL

## 2021-11-05 MED ADMIN — gabapentin (NEURONTIN) capsule 600 mg: 600 mg | ORAL

## 2021-11-05 MED ADMIN — insulin lispro (HumaLOG) injection 0-20 Units: 0-20 [IU] | SUBCUTANEOUS | @ 16:00:00

## 2021-11-05 MED ADMIN — atorvastatin (LIPITOR) tablet 40 mg: 40 mg | ORAL

## 2021-11-05 MED ADMIN — gabapentin (NEURONTIN) capsule 600 mg: 600 mg | ORAL | @ 19:00:00

## 2021-11-05 MED ADMIN — insulin lispro (HumaLOG) injection 30 Units: 30 [IU] | SUBCUTANEOUS | @ 21:00:00

## 2021-11-05 MED ADMIN — piperacillin-tazobactam (ZOSYN) IVPB (premix) 4.5 g: 4.5 g | INTRAVENOUS | @ 02:00:00 | Stop: 2021-11-10

## 2021-11-05 MED ADMIN — oxyCODONE (ROXICODONE) immediate release tablet 5 mg: 5 mg | ORAL | @ 13:00:00 | Stop: 2021-11-17

## 2021-11-05 MED ADMIN — dornase alfa (PULMOZYME) 1 mg/mL solution 2.5 mg: 2.5 mg | RESPIRATORY_TRACT | @ 13:00:00

## 2021-11-05 MED ADMIN — elexacaftor-tezacaftor-ivacaft (TRIKAFTA) tablet 2 tablet  **Patient Supplied**: 2 | ORAL | @ 12:00:00

## 2021-11-05 MED ADMIN — pancrelipase (Lip-Prot-Amyl) (CREON) 24,000-76,000 -120,000 unit delayed release capsule 288,000 units of lipase: 288000 [IU] | ORAL | @ 16:00:00

## 2021-11-05 MED ADMIN — FLUoxetine (PROzac) capsule 60 mg: 60 mg | ORAL | @ 12:00:00

## 2021-11-05 MED ADMIN — pantoprazole (PROTONIX) EC tablet 40 mg: 40 mg | ORAL | @ 12:00:00

## 2021-11-05 MED ADMIN — colistimethate (COLYMYCIN) 150 mg in sodium chloride (NS) 4 mL inhalation: 150 mg | RESPIRATORY_TRACT

## 2021-11-05 MED ADMIN — insulin lispro (HumaLOG) injection 30 Units: 30 [IU] | SUBCUTANEOUS | @ 12:00:00

## 2021-11-05 MED ADMIN — montelukast (SINGULAIR) tablet 10 mg: 10 mg | ORAL

## 2021-11-05 MED ADMIN — fluticasone furoate-vilanteroL (BREO ELLIPTA) 200-25 mcg/dose inhaler 1 puff: 1 | RESPIRATORY_TRACT | @ 13:00:00

## 2021-11-05 MED ADMIN — insulin glargine (LANTUS) injection 40 Units: 40 [IU] | SUBCUTANEOUS | @ 02:00:00

## 2021-11-05 MED ADMIN — lisinopriL (PRINIVIL,ZESTRIL) tablet 10 mg: 10 mg | ORAL | @ 12:00:00

## 2021-11-05 MED ADMIN — piperacillin-tazobactam (ZOSYN) IVPB (premix) 4.5 g: 4.5 g | INTRAVENOUS | @ 21:00:00 | Stop: 2021-11-10

## 2021-11-05 MED ADMIN — pancrelipase (Lip-Prot-Amyl) (CREON) 24,000-76,000 -120,000 unit delayed release capsule 288,000 units of lipase: 288000 [IU] | ORAL | @ 12:00:00

## 2021-11-05 MED ADMIN — piperacillin-tazobactam (ZOSYN) IVPB (premix) 4.5 g: 4.5 g | INTRAVENOUS | @ 15:00:00 | Stop: 2021-11-10

## 2021-11-05 MED ADMIN — insulin lispro (HumaLOG) injection 30 Units: 30 [IU] | SUBCUTANEOUS | @ 16:00:00

## 2021-11-05 MED ADMIN — oxyCODONE (ROXICODONE) immediate release tablet 5 mg: 5 mg | ORAL | Stop: 2021-11-17

## 2021-11-05 MED ADMIN — traZODone (DESYREL) tablet 150 mg: 150 mg | ORAL

## 2021-11-05 MED ADMIN — sodium chloride 7% nebulizer solution 4 mL: 4 mL | RESPIRATORY_TRACT | @ 13:00:00

## 2021-11-05 MED ADMIN — pediatric multivitamin-vit D3 1,500 unit-vit K 800 mcg (MVW COMPLETE FORMULATION) capsule: 2 | ORAL | @ 12:00:00

## 2021-11-05 MED ADMIN — pancrelipase (Lip-Prot-Amyl) (CREON) 24,000-76,000 -120,000 unit delayed release capsule 288,000 units of lipase: 288000 [IU] | ORAL | @ 21:00:00

## 2021-11-05 MED ADMIN — sodium chloride 7% nebulizer solution 4 mL: 4 mL | RESPIRATORY_TRACT | @ 20:00:00

## 2021-11-05 MED ADMIN — pediatric multivitamin-vit D3 1,500 unit-vit K 800 mcg (MVW COMPLETE FORMULATION) capsule: 2 | ORAL

## 2021-11-05 MED ADMIN — gabapentin (NEURONTIN) capsule 600 mg: 600 mg | ORAL | @ 12:00:00

## 2021-11-05 MED ADMIN — cetirizine (ZyrTEC) tablet 10 mg: 10 mg | ORAL | @ 12:00:00

## 2021-11-05 NOTE — Unmapped (Signed)
Care Management  Initial Transition Planning Assessment              General  Care Manager assessed the patient by : Medical record review, Telephone conversation with patient, Discussion with Clinical Care team  Orientation Level: Oriented X4  Reason for referral: Discharge Planning, Home Health     Type of Residence: Mailing Address:  [address]  [place] [address]  Contacts: Accompanied by: Alone  Patient Phone Number: [phone] (cell)           Medical Provider(s): Harrel Bryndon Cumbie, MD  Reason for Admission: Admitting Diagnosis:  No admission diagnoses are documented for this encounter.  Past Medical History:   has a past medical history of Anxiety, Chronic pain disorder, Cystic fibrosis (CMS-HCC), Depression, Hypertension, Lumbar radiculopathy (10/26/2020), and Nonproductive cough (04/05/2018).  Past Surgical History:   has a past surgical history that includes pr removal of lung,lobectomy (Right, 03/29/2017) and IR Insert Port Age Greater Than 5 Years (03/27/2019).   Previous admit date: 09/08/2021    Primary Insurance- Payor: Medical sales representative / Plan: [employer] CT GEN CHOICE FUND OA PLUS / Product Type: *No Product type* /   Secondary Insurance - None  Prescription Coverage -   Preferred Pharmacy - [facility] - [place] - [address]  [facility]  [facility] - [place], [place] - [address]  [facility] ([place] [facility]DRESS) - [place], [place] - [address] AT [address], [place]    Transportation home: Private vehicle    Contact/Decision Maker  Extended Emergency Contact Information  Primary Emergency Contact: Casteneda,Alyson  Address: [address]           [place], [place] [address] [place] [employer] Phone: [phone]  Relation: Spouse  Preferred language: ENGLISH  Interpreter needed? No    Legal Next of Kin / Guardian / POA / Advance Directives     HCDM (patient stated preference): Vesey,Alyson - Spouse - [phone]    Advance Directive (Medical Treatment)  Does patient have an advance directive covering medical treatment?: Patient does not have advance directive covering medical treatment.  Reason patient does not have an advance directive covering medical treatment:: Patient does not wish to complete one at this time.    Health Care Decision Maker [HCDM] (Medical & Mental Health Treatment)  Healthcare Decision Maker: HCDM documented in the HCDM/Contact Info section.  Information offered on HCDM, Medical & Mental Health advance directives:: Patient declined information.    Advance Directive (Mental Health Treatment)  Does patient have an advance directive covering mental health treatment?: Patient does not have advance directive covering mental health treatment.  Reason patient does not have an advance directive covering mental health treatment:: HCDM documented in the HCDM/Contact Info section.    Readmission Information    Have you been hospitalized in the last 30 days?: No     Did the following happen with your discharge?    Patient Information  Lives with: Spouse/significant other  Type of Residence: Private residence   Location/Detail: [place]  Support Systems/Concerns: Spouse  Responsibilities/Dependents at home?: No  Home Care services in place prior to admission?: No   Equipment Currently Used at Home: respiratory supplies (nebulizer and chest vest)   Currently receiving outpatient dialysis?: No   Financial Information     Need for financial assistance?: No     Social Determinants of Health  Social Determinants of Health  Food Insecurity: No Food Insecurity   ??? Worried About Programme researcher, broadcasting/film/video in the Last Year: Never true   ??? Ran Out of Food in the Last Year: Never true   Tobacco Use: Medium Risk   ??? Smoking Tobacco Use: Former   ??? Smokeless Tobacco Use: Never   ??? Passive Exposure: Not on file   Transportation Needs: No Transportation Needs   ??? Lack of Transportation (Medical): No   ??? Lack of Transportation (Non-Medical): No   Alcohol Use: Not on file   Housing/Utilities: Low Risk    ??? Within the past 12 months, have you ever stayed: outside, in a car, in a tent, in an overnight shelter, or temporarily in someone else's home (i.e. couch-surfing)?: No   ??? Are you worried about losing your housing?: No   ??? Within the past 12 months, have you been unable to get utilities (heat, electricity) when it was really needed?: No   Substance Use: Not on file   Financial Resource Strain: Low Risk    ??? Difficulty of Paying Living Expenses: Not hard at all   Physical Activity: Not on file   Health Literacy: Low Risk    ??? : Never   Stress: Not on file   Intimate Partner Violence: Not on file   Depression: Not on file   Social Connections: Not on file       Complex Discharge Information    Is patient identified as a difficult/complex discharge?: No  Interventions:     Discharge Needs Assessment  Concerns to be Addressed: discharge planning, care coordination/care conferences, adjustment to diagnosis/illness    Clinical Risk Factors: Multiple Diagnoses (Chronic)  Barriers to taking medications: No  Prior overnight hospital stay or ED visit in last 90 days: Yes  Anticipated Changes Related to Illness: none  Equipment Needed After Discharge: none    Discharge Facility/Level of Care Needs: other (see comments) (Home)    Readmission  Risk of Unplanned Readmission Score: UNPLANNED READMISSION SCORE: 34.84%  Predictive Model Details          35% (High)  Factor Value    Calculated 11/05/2021 08:04 38% Number of active Rx orders 79    [place] Risk of Unplanned Readmission Model 17% Number of hospitalizations in last year 5     16% Number of ED visits in last six months 4     7% ECG/EKG order present in last 6 months     5% Encounter of ten days or longer in last year present     5% Imaging order present in last 6 months     4% Phosphorous result present     3% Active anticoagulant Rx order present     2% Age 36     2% Charlson Comorbidity Index 2     1% Current length of stay 1.614 days     1% Active ulcer medication Rx order present      Readmitted Within the Last 30 Days? (No if blank)        Discharge Plan  Screen findings are: Discharge planning needs identified or anticipated (Comment). (Pt will need HI/HH)    Expected Discharge Date: 11/24/2021    Expected Transfer from Critical Care:  NA    Quality data for continuing care services shared with patient and/or representative?: Yes  Patient and/or family were provided with choice of facilities / services that are available and appropriate to meet post hospital care needs?: Yes   List choices  in order highest to lowest preferred, if applicable. : [facility] per pt    Initial Assessment complete?: Yes

## 2021-11-05 NOTE — Unmapped (Signed)
Pt A&Ox4, VSS. Blood sugar monitored and medicated as ordered awaiting No respiratory distress, patient continues with IV antibiotic without difficulty via PIV.  Patient up ambulating in the room. Medicated for pain to rib cage with good temporary effect.   Contact isolation maintained   Problem: Adult Inpatient Plan of Care  Goal: Plan of Care Review  Outcome: Progressing  Goal: Patient-Specific Goal (Individualized)  Outcome: Progressing  Goal: Absence of Hospital-Acquired Illness or Injury  Outcome: Progressing  Intervention: Prevent Skin Injury  Recent Flowsheet Documentation  Taken 11/05/2021 0829 by Fransico Meadow, RN  Skin Protection: adhesive use limited  Goal: Optimal Comfort and Wellbeing  Outcome: Progressing  Goal: Readiness for Transition of Care  Outcome: Progressing  Goal: Rounds/Family Conference  Outcome: Progressing     Problem: Infection  Goal: Absence of Infection Signs and Symptoms  Outcome: Progressing     Problem: Airway Clearance Ineffective  Goal: Effective Airway Clearance  Outcome: Progressing     Problem: Diabetes Comorbidity  Goal: Blood Glucose Level Within Targeted Range  Outcome: Progressing

## 2021-11-05 NOTE — Unmapped (Signed)
Patient stable thru the day, Blood sugar monitored and medicated as ordered awaiting Endocrine consult. No glycemic reaction.  No respiratory distress patient continues with IV antibiotic without difficulty via PIV.  Patient up ambulating in the room. Medicated for pain to rib cage with good temporary effect.   Contact isolation maintained   Problem: Adult Inpatient Plan of Care  Goal: Plan of Care Review  Outcome: Progressing  Goal: Patient-Specific Goal (Individualized)  Outcome: Progressing  Goal: Absence of Hospital-Acquired Illness or Injury  Outcome: Progressing  Intervention: Identify and Manage Fall Risk  Recent Flowsheet Documentation  Taken 11/04/2021 0800 by Veverly Fells, RN  Safety Interventions:   isolation precautions   low bed  Intervention: Prevent Skin Injury  Recent Flowsheet Documentation  Taken 11/04/2021 0800 by Veverly Fells, RN  Skin Protection: cleansing with dimethicone incontinence wipes  Goal: Optimal Comfort and Wellbeing  Outcome: Progressing     Problem: Infection  Goal: Absence of Infection Signs and Symptoms  Outcome: Progressing  Intervention: Prevent or Manage Infection  Recent Flowsheet Documentation  Taken 11/04/2021 0800 by Veverly Fells, RN  Isolation Precautions: contact precautions maintained     Problem: Airway Clearance Ineffective  Goal: Effective Airway Clearance  Outcome: Progressing     Problem: Diabetes Comorbidity  Goal: Blood Glucose Level Within Targeted Range  Outcome: Progressing

## 2021-11-05 NOTE — Unmapped (Signed)
All safety and fall precautions in place. No acute changes on this shift.     Problem: Adult Inpatient Plan of Care  Goal: Plan of Care Review  Outcome: Progressing  Goal: Patient-Specific Goal (Individualized)  Outcome: Progressing  Goal: Absence of Hospital-Acquired Illness or Injury  Outcome: Progressing  Intervention: Identify and Manage Fall Risk  Recent Flowsheet Documentation  Taken 11/04/2021 2000 by Dillard Cannon, RN  Safety Interventions:   fall reduction program maintained   lighting adjusted for tasks/safety   low bed   nonskid shoes/slippers when out of bed  Goal: Optimal Comfort and Wellbeing  Outcome: Progressing  Goal: Readiness for Transition of Care  Outcome: Progressing  Goal: Rounds/Family Conference  Outcome: Progressing

## 2021-11-05 NOTE — Unmapped (Signed)
[facility] Internal Medicine Hospitalist Progress Note     LOS: 1 day       Assessment/Plan:  Principal Problem:    Cystic fibrosis with pulmonary exacerbation (CMS-HCC)  Active Problems:    Essential hypertension    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)    Pancreatic insufficiency due to cystic fibrosis (CMS-HCC)    Chronic deep vein thrombosis (DVT) of lower extremity (CMS-HCC)    Long term current use of anticoagulant therapy  Resolved Problems:    * No resolved hospital problems. *         36 year old Caucasian male well-known to the hospital service for exacerbation of cystic fibrosis, admitted for IV antibiotics and managed of multiple comorbidities.    1.  CF exacerbation.  -Continue current antibiotic and airway management per Pulmonology recommendation.    2.  Type 2 diabetes mellitus.  He is on an insulin pump at home but his hemoglobin A1c from available labs 1 year ago was 10.7 and was even higher 11.68 months ago.  It was 10.6 on this admission so it appears his home insulin pump management has not resulted in overall good glycemic control.  Continue with Lantus and Premeal insulin as well as sliding scale, ACHS Accu-Cheks.    3.  Essential hypertension.  Continue current regimen    4.  Pancreatic insufficiency, continue pancreatic enzyme therapy.    5.  History of DVT, continue anticoagulation chronically.    *DVT prophylaxis: He is fully ambulatory.    *Disposition: He will require over 2 midnights in the hospital.      Please page the [facility]) pager at [phone] with questions.      Consultants:   1.  Pulmonology      Subjective:   No fever, chills.  Breathing is a bit better.  No complaint of hemoptysis.    Objective:       Vital signs in last 24 hours:  Temp:  [36.1 ??C (97 ??F)-37.2 ??C (99 ??F)] 37.2 ??C (99 ??F)  Heart Rate:  [90-120] 120  Resp:  [18] 18  BP: (128-146)/(74-84) 128/79  MAP (mmHg):  [107] 107  SpO2:  [96 %-98 %] 96 %    Intake/Output last 24 hours:    Intake/Output Summary (Last 24 hours) at 11/04/2021 1917  Last data filed at 11/04/2021 0845  Gross per 24 hour   Intake 240 ml   Output --   Net 240 ml         Physical Exam:    Gen: Alert, NAD    HEENT: No icterus.    CV: Regular rate and rhythm.    PULM/chest: Good air exchange, no wheezing.  Few crackles in right base.    AVW:UJWJ, Non tender, No palpable organomegaly    Ext: No C/C/E    Neuro: No acute focal deficits.    Skin: Warm, dry.      Medications:   Scheduled Meds:  ??? albuterol  2.5 mg Nebulization 4x Daily (RT)   ??? amitriptyline  100 mg Oral Nightly   ??? atorvastatin  40 mg Oral Nightly   ??? cetirizine  10 mg Oral Daily   ??? colistimetate (COLISTIN) inhalation  150 mg Inhalation BID (RT)   ??? dornase alfa  2.5 mg Inhalation Daily (RT)   ??? elexacaftor-tezacaftor-ivacaft  2 tablet Oral daily    And   ??? elexacaftor-tezacaftor-ivacaft  1 tablet Oral Q PM   ??? FLUoxetine  60 mg Oral Daily   ???  fluticasone furoate-vilanteroL  1 puff Inhalation Daily (RT)   ??? fluticasone propionate  2 spray Each Nare Daily   ??? gabapentin  600 mg Oral TID   ??? insulin glargine  40 Units Subcutaneous Nightly   ??? insulin lispro  0-20 Units Subcutaneous ACHS   ??? insulin lispro  30 Units Subcutaneous 3xd Meals   ??? lamoTRIgine  400 mg Oral Daily   ??? levoFLOXacin  750 mg Oral Q24H SCH   ??? lisinopriL  10 mg Oral Daily   ??? montelukast  10 mg Oral Nightly   ??? pancrelipase (Lip-Prot-Amyl)  288,000 units of lipase Oral 3xd Meals   ??? pantoprazole  40 mg Oral BID   ??? MVW Complete (pediatric multivit 61-D3-vit K)  2 capsule Oral BID   ??? piperacillin-tazobactam (ZOSYN) IV (intermittent)  4.5 g Intravenous Q6H   ??? pramipexole  0.25 mg Oral Nightly   ??? rivaroxaban  20 mg Oral Daily   ??? sodium chloride 7%  4 mL Nebulization 4x Daily (RT)   ??? traZODone  150 mg Oral Nightly     Continuous Infusions:    Lab Results   Component Value Date    WBC 7.8 11/04/2021    HGB 14.2 11/04/2021    HCT 41.8 11/04/2021    PLT 230 11/04/2021       Lab Results Component Value Date    NA 133 (L) 11/04/2021    K 4.2 11/04/2021    CL 97 (L) 11/04/2021    CO2 24.9 11/04/2021    BUN 14 11/04/2021    CREATININE 0.84 11/04/2021    GLU 234 (H) 11/04/2021    CALCIUM 9.1 11/04/2021    MG 1.4 (L) 11/04/2021    PHOS 4.9 11/04/2021       Lab Results   Component Value Date    BILITOT 0.8 11/04/2021    BILIDIR 0.20 08/02/2021    PROT 7.0 11/04/2021    ALBUMIN 3.7 11/04/2021    ALT 68 (H) 11/04/2021    AST 42 (H) 11/04/2021    ALKPHOS 124 (H) 11/04/2021       Lab Results   Component Value Date    PT 24.0 (H) 11/04/2021    INR 2.06 11/04/2021    APTT 31.6 11/03/2021     Recent Labs     11/04/21  0741   NA 133*   K 4.2   CL 97*   CO2 24.9   BUN 14   CREATININE 0.84   GLU 234*   CALCIUM 9.1   PROT 7.0   BILITOT 0.8   AST 42*   ALT 68*   ALKPHOS 124*   MG 1.4*   PHOS 4.9     Recent Labs     11/03/21  1525 11/04/21  0741   INR 1.03 2.06   APTT 31.6  --      Recent Labs     11/03/21  1525   A1C 10.6*     Pending labs:   Pending Labs     Order Current Status    AFB SMEAR In process    AFB culture In process    CF Sputum/ CF Sinus Culture In process    IgE Total In process    IgE Total In process        Laboratory studies since admission reviewed.    35 minutes, over 50% spent in counseling and coordination of care.  Tawni Levy MD

## 2021-11-05 NOTE — Unmapped (Signed)
Patient remains on room air. Breathing treatments given. Home chest vest done for airway clearance

## 2021-11-05 NOTE — Unmapped (Signed)
Problem: Adult Inpatient Plan of Care  Goal: Plan of Care Review  Outcome: Progressing     Problem: Airway Clearance Ineffective  Goal: Effective Airway Clearance  Outcome: Progressing    Remains on RA, inhaled medications given as ordered without complications or distress noted this shift. Performed home chest vest for mucus clearance independently.

## 2021-11-06 MED ADMIN — amitriptyline (ELAVIL) tablet 100 mg: 100 mg | ORAL | @ 01:00:00

## 2021-11-06 MED ADMIN — pediatric multivitamin-vit D3 1,500 unit-vit K 800 mcg (MVW COMPLETE FORMULATION) capsule: 2 | ORAL | @ 13:00:00

## 2021-11-06 MED ADMIN — albuterol 2.5 mg /3 mL (0.083 %) nebulizer solution 2.5 mg: 2.5 mg | RESPIRATORY_TRACT | @ 01:00:00

## 2021-11-06 MED ADMIN — fluticasone propionate (FLONASE) 50 mcg/actuation nasal spray 2 spray: 2 | NASAL | @ 13:00:00

## 2021-11-06 MED ADMIN — insulin lispro (HumaLOG) injection 30 Units: 30 [IU] | SUBCUTANEOUS | @ 17:00:00

## 2021-11-06 MED ADMIN — sodium chloride 7% nebulizer solution 4 mL: 4 mL | RESPIRATORY_TRACT | @ 15:00:00

## 2021-11-06 MED ADMIN — rivaroxaban (XARELTO) tablet 20 mg: 20 mg | ORAL | @ 13:00:00

## 2021-11-06 MED ADMIN — piperacillin-tazobactam (ZOSYN) IVPB (premix) 4.5 g: 4.5 g | INTRAVENOUS | @ 14:00:00 | Stop: 2021-11-10

## 2021-11-06 MED ADMIN — albuterol 2.5 mg /3 mL (0.083 %) nebulizer solution 2.5 mg: 2.5 mg | RESPIRATORY_TRACT | @ 22:00:00

## 2021-11-06 MED ADMIN — gabapentin (NEURONTIN) capsule 600 mg: 600 mg | ORAL | @ 01:00:00

## 2021-11-06 MED ADMIN — FLUoxetine (PROzac) capsule 60 mg: 60 mg | ORAL | @ 13:00:00

## 2021-11-06 MED ADMIN — pantoprazole (PROTONIX) EC tablet 40 mg: 40 mg | ORAL | @ 01:00:00

## 2021-11-06 MED ADMIN — albuterol 2.5 mg /3 mL (0.083 %) nebulizer solution 2.5 mg: 2.5 mg | RESPIRATORY_TRACT | @ 15:00:00

## 2021-11-06 MED ADMIN — colistimethate (COLYMYCIN) 150 mg in sodium chloride (NS) 4 mL inhalation: 150 mg | RESPIRATORY_TRACT | @ 01:00:00

## 2021-11-06 MED ADMIN — pancrelipase (Lip-Prot-Amyl) (CREON) 24,000-76,000 -120,000 unit delayed release capsule 288,000 units of lipase: 288000 [IU] | ORAL | @ 20:00:00

## 2021-11-06 MED ADMIN — cetirizine (ZyrTEC) tablet 10 mg: 10 mg | ORAL | @ 13:00:00

## 2021-11-06 MED ADMIN — insulin glargine (LANTUS) injection 40 Units: 40 [IU] | SUBCUTANEOUS | @ 01:00:00

## 2021-11-06 MED ADMIN — lamoTRIgine (LaMICtal) tablet 400 mg: 400 mg | ORAL | @ 13:00:00

## 2021-11-06 MED ADMIN — oxyCODONE (ROXICODONE) immediate release tablet 5 mg: 5 mg | ORAL | @ 01:00:00 | Stop: 2021-11-17

## 2021-11-06 MED ADMIN — insulin lispro (HumaLOG) injection 0-20 Units: 0-20 [IU] | SUBCUTANEOUS | @ 17:00:00

## 2021-11-06 MED ADMIN — insulin lispro (HumaLOG) injection 30 Units: 30 [IU] | SUBCUTANEOUS | @ 22:00:00

## 2021-11-06 MED ADMIN — sodium chloride 7% nebulizer solution 4 mL: 4 mL | RESPIRATORY_TRACT | @ 01:00:00

## 2021-11-06 MED ADMIN — insulin lispro (HumaLOG) injection 30 Units: 30 [IU] | SUBCUTANEOUS | @ 13:00:00

## 2021-11-06 MED ADMIN — colistimethate (COLYMYCIN) 150 mg in sodium chloride (NS) 4 mL inhalation: 150 mg | RESPIRATORY_TRACT | @ 15:00:00

## 2021-11-06 MED ADMIN — sodium chloride 7% nebulizer solution 4 mL: 4 mL | RESPIRATORY_TRACT | @ 18:00:00

## 2021-11-06 MED ADMIN — oxyCODONE (ROXICODONE) immediate release tablet 5 mg: 5 mg | ORAL | @ 18:00:00 | Stop: 2021-11-17

## 2021-11-06 MED ADMIN — traZODone (DESYREL) tablet 150 mg: 150 mg | ORAL | @ 01:00:00

## 2021-11-06 MED ADMIN — sodium chloride 7% nebulizer solution 4 mL: 4 mL | RESPIRATORY_TRACT | @ 22:00:00

## 2021-11-06 MED ADMIN — pancrelipase (Lip-Prot-Amyl) (CREON) 24,000-76,000 -120,000 unit delayed release capsule 288,000 units of lipase: 288000 [IU] | ORAL | @ 17:00:00

## 2021-11-06 MED ADMIN — pediatric multivitamin-vit D3 1,500 unit-vit K 800 mcg (MVW COMPLETE FORMULATION) capsule: 2 | ORAL | @ 01:00:00

## 2021-11-06 MED ADMIN — atorvastatin (LIPITOR) tablet 40 mg: 40 mg | ORAL | @ 01:00:00

## 2021-11-06 MED ADMIN — piperacillin-tazobactam (ZOSYN) IVPB (premix) 4.5 g: 4.5 g | INTRAVENOUS | @ 08:00:00 | Stop: 2021-11-10

## 2021-11-06 MED ADMIN — fluticasone furoate-vilanteroL (BREO ELLIPTA) 200-25 mcg/dose inhaler 1 puff: 1 | RESPIRATORY_TRACT | @ 15:00:00

## 2021-11-06 MED ADMIN — albuterol 2.5 mg /3 mL (0.083 %) nebulizer solution 2.5 mg: 2.5 mg | RESPIRATORY_TRACT | @ 18:00:00

## 2021-11-06 MED ADMIN — pramipexole (MIRAPEX) tablet 0.25 mg: .25 mg | ORAL | @ 01:00:00

## 2021-11-06 MED ADMIN — elexacaftor-tezacaftor-ivacaft (TRIKAFTA) tablet 2 tablet  **Patient Supplied**: 2 | ORAL | @ 13:00:00

## 2021-11-06 MED ADMIN — elexacaftor-tezacaftor-ivacaft (TRIKAFTA) tablet 1 tablet **Patient Supplied**: 1 | ORAL | @ 01:00:00

## 2021-11-06 MED ADMIN — piperacillin-tazobactam (ZOSYN) IVPB (premix) 4.5 g: 4.5 g | INTRAVENOUS | @ 02:00:00 | Stop: 2021-11-10

## 2021-11-06 MED ADMIN — montelukast (SINGULAIR) tablet 10 mg: 10 mg | ORAL | @ 01:00:00

## 2021-11-06 MED ADMIN — piperacillin-tazobactam (ZOSYN) IVPB (premix) 4.5 g: 4.5 g | INTRAVENOUS | @ 20:00:00 | Stop: 2021-11-10

## 2021-11-06 MED ADMIN — levoFLOXacin (LEVAQUIN) tablet 750 mg: 750 mg | ORAL | @ 13:00:00 | Stop: 2021-11-19

## 2021-11-06 MED ADMIN — pantoprazole (PROTONIX) EC tablet 40 mg: 40 mg | ORAL | @ 13:00:00

## 2021-11-06 MED ADMIN — lisinopriL (PRINIVIL,ZESTRIL) tablet 10 mg: 10 mg | ORAL | @ 13:00:00

## 2021-11-06 MED ADMIN — oxyCODONE (ROXICODONE) immediate release tablet 5 mg: 5 mg | ORAL | @ 14:00:00 | Stop: 2021-11-17

## 2021-11-06 MED ADMIN — gabapentin (NEURONTIN) capsule 600 mg: 600 mg | ORAL | @ 18:00:00

## 2021-11-06 MED ADMIN — gabapentin (NEURONTIN) capsule 600 mg: 600 mg | ORAL | @ 13:00:00

## 2021-11-06 MED ADMIN — pancrelipase (Lip-Prot-Amyl) (CREON) 24,000-76,000 -120,000 unit delayed release capsule 288,000 units of lipase: 288000 [IU] | ORAL | @ 13:00:00

## 2021-11-06 NOTE — Unmapped (Signed)
Patient alert and oriented x4.  VSS.  Ambulates independently.  Continent of bowel and bladder.  Fingerstick blood glucose monitoring with sliding scale and scheduled coverage.  Medicated with prn oxycodone for complaint of pain.  Receiving IV antibiotics.  Call light in reach.  Problem: Adult Inpatient Plan of Care  Goal: Plan of Care Review  Outcome: Progressing  Goal: Patient-Specific Goal (Individualized)  Outcome: Progressing  Goal: Absence of Hospital-Acquired Illness or Injury  Outcome: Progressing  Intervention: Identify and Manage Fall Risk  Recent Flowsheet Documentation  Taken 11/06/2021 1200 by Jarrett Soho, RN  Safety Interventions: fall reduction program maintained  Taken 11/06/2021 1000 by Jarrett Soho, RN  Safety Interventions: fall reduction program maintained  Taken 11/06/2021 0800 by Jarrett Soho, RN  Safety Interventions: fall reduction program maintained  Goal: Optimal Comfort and Wellbeing  Outcome: Progressing  Goal: Readiness for Transition of Care  Outcome: Progressing  Goal: Rounds/Family Conference  Outcome: Progressing     Problem: Infection  Goal: Absence of Infection Signs and Symptoms  Outcome: Progressing     Problem: Airway Clearance Ineffective  Goal: Effective Airway Clearance  Outcome: Progressing     Problem: Diabetes Comorbidity  Goal: Blood Glucose Level Within Targeted Range  Outcome: Progressing

## 2021-11-06 NOTE — Unmapped (Signed)
ULTRASOUND PIV PROCEDURE NOTE    Indications:   Poor venous access.    Ultrasound guidance was necessary to obtain access.     Procedure Details:  Identity of the patient was confirmed via name, medical record number and date of birth. The availability of the correct equipment was verified.    The vein was identified and measured for ultrasound catheter insertion.       Vein measurement (without tourniquet):   0.33 cm, depth of <0.05cm; Noted bruise above stick for labs.  Other measurements in that area were < 0.27cm.  Right had pain and edema at his other piv site. Avoid that arm until pain and swelling went down.     A(n) 20 g x 1.75 inch catheter was selected based on the recommendations below:    [facility] Catheter/Vein Ratio Guidelines    Chart to determine PIV catheter size/length to use based on vein diameter and depth   Catheter Gauge Size (g)  22g 20g 18g   Catheter length (inches)  1.75 1.75 1.75   Catheter diameter measurement (mm) 0.9 mm 1.1 mm 1.3 mm          Minimum required vein diameter       Sonosite (cm)  0.27 cm 0.33 cm 0.39 cm          Maximum vein depth  1.25 cm 1.25 cm 1.25 cm            The field was prepared with necessary supplies and equipment.  Probe cover and sterile gel utilized. Insertion site was prepped with alcohol and allowed to dry.  The catheter extension was primed with normal saline. The [name] PIV was placed in the left forearm with 1 attempt(s).     Catheter aspirated, 5 mL blood return present. The catheter was then flushed with 10 mL of normal saline. Insertion site cleansed, and dressing applied per manufacturer guidelines. The catheter was inserted with difficulty due to diameter size.  Patient has a port-a-cath but requests that we don't use it.    Thank you,     Dennis Bast RN    Ultrasound Resource Nurse    Workup / Procedure Time:  45 minutes

## 2021-11-06 NOTE — Unmapped (Signed)
No acute changes on this shift. All safety and fall precautions in place.    Problem: Adult Inpatient Plan of Care  Goal: Plan of Care Review  Outcome: Progressing  Goal: Patient-Specific Goal (Individualized)  Outcome: Progressing  Goal: Absence of Hospital-Acquired Illness or Injury  Outcome: Progressing  Intervention: Identify and Manage Fall Risk  Recent Flowsheet Documentation  Taken 11/05/2021 2000 by Dillard Cannon, RN  Safety Interventions:   fall reduction program maintained   lighting adjusted for tasks/safety   low bed   nonskid shoes/slippers when out of bed  Goal: Optimal Comfort and Wellbeing  Outcome: Progressing  Goal: Readiness for Transition of Care  Outcome: Progressing  Goal: Rounds/Family Conference  Outcome: Progressing

## 2021-11-06 NOTE — Unmapped (Signed)
Cystic Fibrosis Nutrition Assessment    Inpatient, Telehealth via phone: Nutrition Risk Screening this admission for CF and related follow up  Primary Pulmonary Provider: Dr [name]  ===================================================================  Christian Young is a 36 y.o. male w/ PMH significant for cystic fibrosis, pancreatic insufficiency, diabetes who presented on 3/24 for CF exacerbation. Unable to reach via telephone today.  ===================================================================  INTERVENTION:    1. Change diet to High Calorie High Protein    2. Given elevated PT, recommend 5mg  phytonadione daily x 5 days, then recheck PT. If WNL, decrease frequency to 5mg  phytonadione twice weekly while on IV antibiotics.    3. Agree with Diabetes Educator consult for additional teaching given uncontrolled DM    4. Continue remainder of nutrition regimen:  - bowel regimen as ordered  - insulin regimen per endocrinology  - MVW gel cap BID  - acid reducer  - enzyme regimen: Creon 24,000 x 12 caps/meal, 6 caps/snack (d/c with order for Creon 36,000s per home regimen)    Inpatient:   Will follow up with patient per protocol: 1-2 times per week (and more frequent as indicated)  Monitor for potential discharge needs with multi-disciplinary team.   ===================================================================  ASSESSMENT:  Nutrition Category = Adult Obesity Class 1: BMI 30 to < 35 kg/m2    Estimated daily needs: 3284 kcal/day, 136-181 g PRO/day, 3368 mL fluid/day      Calories estimated using: Cystic Fibrosis Conference Formula, protein per DRI x 1.5-2, fluid per [facility]    Current diet is not appropriate for CF. Current PO intake is adequate to meet estimated CF needs. Patient continues to work towards goals for weight management.   Enzyme dose is within established guidelines. Vitamin prescription is appropriate to reach/maintain optimal fat soluble vitamin levels. Patient to benefit from adjustment in insulin regimen related to carhohydrate intake. Patient on CFTR modulator is consuming adequate amounts of fat-containing foods with prescribed medication to optimize absorption.    ASPEN/AND Malnutrition Screening:  Patient does not meet ASPEN/AND criteria for malnutrition at this time.    Goals:  1. Ongoing:  Meet estimated daily needs  2. Ongoing:  Reach/maintain established anthropometric goals for Adult CF: BMI < 30 kg/m2   3. Ongoing:  Normal fat-soluble vitamin levels: Vitamin A, Vitamin E and PT per lab range; Vitamin D 25OH total >30   4. Ongoing:  Maintain glucose control. Carbohydrate content of diet should comprise 40-50% of total calorie needs, but carbohydrates are not restricted in this population.    5. Ongoing:  Meet sodium needs for CF     Nutrition goals reviewed, and relevant barriers identified and addressed: none evident.   Patient is evaluated to have good  willingness and ability to achieve nutrition goals.   ===================================================================  INPATIENT:  Christian Young is admitted with No admission diagnoses are documented for this encounter..    Current Nutrition Orders (inpatient):  Oral intake        Nutrition Orders   (From admission, onward)             Start     Ordered    11/04/21 0936  Nutrition Therapy Regular/House  Effective now        Question:  Nutrition Therapy:  Answer:  Regular/House    11/04/21 0936              CF Nutrition related medications (inpatient): Nutritionally relevant medications reviewed.   lipitor  trikafta  Insulin regimen per endo  Creon 24,000  x 12 caps/meal, 6 caps/snack  protonix  MVW complete formulation gel cap BID  IV abx  PRN zofran  PRN miralax    CF Nutrition related labs (inpatient): reviewed; PT elevated  ==================================================================  CLINICAL DATA:  Past Medical History:   Diagnosis Date   ??? Anxiety    ??? Chronic pain disorder    ??? Cystic fibrosis (CMS-HCC)    ??? Depression    ??? Hypertension    ??? Lumbar radiculopathy 10/26/2020   ??? Nonproductive cough 04/05/2018     Patient Active Problem List   Diagnosis   ??? Essential hypertension   ??? Depressive disorder   ??? Mood disorder (CMS-HCC)   ??? Anxiety   ??? 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 Chronic pansinusitis   ??? Pancreatic insufficiency due to cystic fibrosis (CMS-HCC)   ??? History of Mycobacterium abscessus infection   ??? Bronchiectasis (CMS-HCC)   ??? Chronic deep vein thrombosis (DVT) of lower extremity (CMS-HCC)   ??? Cystic fibrosis with pulmonary exacerbation (CMS-HCC)   ??? Cystic fibrosis exacerbation (CMS-HCC)   ??? Obesity (BMI 30-39.9)   ??? Restless leg syndrome   ??? Degeneration of lumbar intervertebral disc   ??? Lumbar radiculopathy   ??? Idiopathic peripheral neuropathy   ??? Long term current use of anticoagulant therapy   ??? Acute non-recurrent pansinusitis   ??? AKI (acute kidney injury) (CMS-HCC)   ??? Hemoptysis     Anthroprometric Evaluation:  Weight changes: 2.4% wt loss x ~2 months  CFTR modulator and weight change: On Trikafta  BMI Readings from Last 3 Encounters:   11/03/21 32.34 kg/m??   09/09/21 33.13 kg/m??   07/30/21 33.23 kg/m??     Wt Readings from Last 3 Encounters:   11/03/21 (!) 108.2 kg (238 lb 8.6 oz)   09/09/21 (!) 110.8 kg (244 lb 4.3 oz)   07/30/21 (!) 111.1 kg (245 lb)     Ht Readings from Last 3 Encounters:   11/03/21 182.9 cm (6' 0.01)   09/08/21 182.9 cm (6')   07/29/21 182.9 cm (6')     ==================================================================  Energy Intake (outpatient):  Diet: Unable to reach [place] for nutrition assessment today.    Allergies, Intolerances, Sensitivities, and/or Cultural/Religious Dietary Restrictions:  include banana  Sodium in diet: historically adequate  Calcium in diet:  historically adequate  Food Insecurity:   Food Insecurity: No Food Insecurity   ??? Worried About Programme researcher, broadcasting/film/video in the Last Year: Never true   ??? Ran Out of Food in the Last Year: Never true   CFTR modulator and Diet: Prescribed Trikafta (elexacaftor/tezacaftor/ivacaftor).  PO Supplements: none  Patient resources for DME/formula:  -none  Appetite Stimulant: none  Enteral feeding tube: none  Physical activity: limited recently d/t back pain; not discussed today    GI/Malabsorption (outpatient):  Enzyme brand, (meals/snacks):  Creon 36,000 @ 8/meal and 4/snack  Enzyme administration details: correct pre-meal administration., good compliance at all meals and snacks  Enzyme dose per MEAL (units lipase/kg/meal) 2661  Enzyme dose per DAY (units lipase/kg/day) 16109  GI meds:  Nutritionally relevant medications reviewed. Miralax, colace, probiotic, prilosec  Stools: last documented BM 3/25 X 1  Fecal Fat Studies: insufficient  No results found for: [id number]  Lab Results   Component Value Date    ELAST <15 (L) 03/19/2017     No results found for: PELAI    Vitamins/Minerals (outpatient):  CF-specific MVI, dose, compliance: MVW Complete Formulation Softgel regular 2 daily, good compliance  Other vitamins/minerals/herbals: none  Patient Resources for vitamins: [employer]  phone [phone]. Active through September 2023.  Calcium supplement: none  Fat-soluble vitamin levels: within normal limits June 2022 with exception of PT. PT historically elevated with normal DCP. Would likely benefit from recheck of DCP.  Lab Results   Component Value Date    VITAMINA 58.7 02/06/2021    VITAMINA 42.4 01/18/2020    VITAMINA 44.4 11/19/2016     Lab Results   Component Value Date    CRP 5.0 07/30/2021    CRP 10.0 06/15/2021    CRP 45.0 (H) 06/08/2021     Lab Results   Component Value Date    VITDTOTAL 38.2 02/06/2021    VITDTOTAL 27.9 03/19/2017     Lab Results   Component Value Date    VITAME 16.0 02/06/2021     Lab Results   Component Value Date    PT 24.0 (H) 11/04/2021    PT 11.7 11/03/2021    PT 23.3 (H) 07/30/2021     Lab Results   Component Value Date    DESGCARBPT 0.2 03/20/2017     No results found for: PIVKAII    Bone Health: Normal vitamin D level. Adequate calcium intake from diet. Normal bone density on DEXA 02/2017.    CF Related Diabetes: yes; on insulin with insulin pump. Followed by endocrinology. Diabetes educators consulted today.  Lab Results   Component Value Date    GLUF 178 (H) 01/18/2020     No results found for: GLUCOSE2HR  Lab Results   Component Value Date    A1C 10.6 (H) 11/03/2021    A1C 9.0 (H) 06/08/2021    A1C 10.0 (H) 03/09/2021       Jackqulyn Livings MPH, RD, LDN  Pager: [phone]

## 2021-11-06 NOTE — Unmapped (Signed)
The patient is currently on Room Air, tolerating well. All scheduled inhaled medications administered. Pt self administers home chest vest for airway clearance. Lung sounds are Diminished.The patients cough has been non-productive. No apparent distress noted at this time. RT will continue to follow.

## 2021-11-06 NOTE — Unmapped (Signed)
This patient was not seen in person and the consultation was conducted over the phone or virtually via WebEx or Mychart Bedside. The Diabetes Education Team Consult Service has moved to a hybrid model to minimize potential spread of COVID-19 and protect patients/providers. During this time, we will be limiting person-to-person contact when possible.       Diabetes education consultation: Consulted for assistance with providing instruction on diabetes self-management skills. Visit with Christian Young at the bedside via phone. Provided 40 minutes of diabetes education.     Assessment:  Christian Young is a 36 year old Caucasian male well-known to the hospital service for exacerbation of cystic fibrosis, admitted for IV antibiotics and managed of multiple comorbidities.  Christian Young has Cystic Fibrosis related Diabetes see MAR for medication list. Christian Young reports that he is a patient of [facility], but doesn't always make his appointments due to increase in gas prices and travel distance. However, he has a f/u appt. scheduled in April and reports no barriers to making it to appointment. Christian Young acknowledges he can improve his eating habits and activity level. Patient expressed that he is committed to making changes and consistenly counting carbs going forward. Christian Young was receptive to diabetes education.     A1C: Reviewed A1C and daily average blood sugar. Discussed how elevated blood sugars affect the body.      Lab Results   Component Value Date    A1C 10.6 (H) 11/03/2021     Insulin administration & safety:   Moyses Pavey reports he has a Omnipod insulin pump and reports no issues. Patient expressed that he does bolus for his meals. However, he is not sure if his carb count is always accurate as he dines out most of the time d/t his wife's work schedule.     Problem-solving:   Hypoglycemia:    Instructed on hypoglycemia recognition & treatment, including 15-15 rule, listing several examples of appropriate fast carb sources, emphasizing importance of having something readily available. Described possible causes and prevention.  Instructed to contact provider for unexplained episode, an episode requiring more than 1 treatment, or 2 episodes within a 2-3 day period, for possible dose adjustment.    Provided printed material: Hypoglycemia in Diabetes as a resource.  Hyperglycemia:  Instructed on hyperglycemia recognition and treatment. Described possible causes and prevention and when to contact provider.   Provided printed material: Hyperglycemia  And Diabetes: Blood Sugar Emergencies as resources      Risk Reduction- Foot Care:  Discussed principles of good foot care such as daily cleansing, and inspection, avoiding walking barefoot, having well-fitting shoes and no bathroom surgery on feet.  Provided education material Diabetes Foot Health: Care Instructions as a resource.      Being Active:  Discussed the impact of physical activity on blood sugars, encouraged to engage in physical activity after meals, examples provided.     Healthy Eating:  Christian Young reports knowing how to read food labels. Christian Young expressed that he eats fast food, but knows how to find nutrition facts. He drinks soda, but plans to decrease soda intake. He usually does not monitor portion sizes.   Discussed with patient meal planning principles for glucose control including the plate method, which foods are carbs and portion control. Reviewed avoiding sugary beverages/concentrated sweet. Reviewed dietary guidelines/ plate method/ avoiding concentrated sweets/eating at consistent times.   Meal Planning Basics  Foods that Affect and Do Not Affect Blood  Glucose  Timing, Amount, and Balance in Meals  Food Labels  Carbohydrate Counting   Low Carbohydrate Snacks    Diabetes Educational Material:  Provided  Read, Set, Start Counting, Carbohydrate Exchange List, Meal Planning Principles,  and Carb Diet Guide printed materials. Reviewed information with Christian Young on effect of carb containing foods on glucose, rationale for consistent carbs for meals, major categories of foods with significant carbs, and portion sizes of typical foods in those categories that are equal to approx 15 grams CHO or 1 carb serving/carb choice.  Instructed on tips for estimating amount of carb in common foods.  Discussed resources on internet, etc for assistance/resources.    Provided Christian Young a copy of the Consistent Carbohydrate & Pediatric Consistent Carbohydrate guide to use while in house.       Insurance: Christian Young has [place]. There are not financial barriers to glucose monitoring.     Monitoring: Christian Young reports using a Dexcom CGM at home. Reports no barriers to glucose monitoring.       Recommendations: At f/u visit with PCP request referral for DSMES (Diabetes Self Management Education and Support) after discharge.     Plan: Will continue to follow while in house.    Thank You,   Thomas Hoff, MSN, RN, Diabetes Nurse Educator- pager: [phone]

## 2021-11-06 NOTE — Unmapped (Signed)
All inhaled medications were given and tolerated well this shift, airway clearance via vest. Patient currently resting comfortably, no apparent distress. Will continue to monitor.

## 2021-11-06 NOTE — Unmapped (Signed)
[facility] Internal Medicine Hospitalist Progress Note     LOS: 2 days       Assessment/Plan:  Principal Problem:    Cystic fibrosis with pulmonary exacerbation (CMS-HCC)  Active Problems:    Essential hypertension    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)    Pancreatic insufficiency due to cystic fibrosis (CMS-HCC)    Chronic deep vein thrombosis (DVT) of lower extremity (CMS-HCC)    Long term current use of anticoagulant therapy  Resolved Problems:    * No resolved hospital problems. *         36 year old Caucasian male well-known to the hospital service for exacerbation of cystic fibrosis, admitted for IV antibiotics and managed of multiple comorbidities.    1.  CF exacerbation.  -Continue current antibiotic and airway management per Pulmonology recommendation  Shown below..    2.  Type 2 diabetes mellitus.  He is on an insulin pump at home but his hemoglobin A1c from available labs 1 year ago was 10.7 and was even higher 11.7 8  months ago.  It was 10.6 on this admission so it appears his home insulin pump management has not resulted in overall good glycemic control.  The patient fortunately was very frank regarding his eating habits and notes there is room for improvement.  He notes that if he makes a portion of food, he will eat all of it, for example a large portion of spaghetti.  He is most agreeable to education and Diabetes consultation for carbohydrate counting, healthy eating and consistent carbohydrate concepts are requested.  Continue with Lantus and Premeal insulin as well as sliding scale, ACHS Accu-Cheks.    3.  Essential hypertension.  Continue current regimen    4.  Pancreatic insufficiency, continue pancreatic enzyme therapy.    5.  History of DVT, continue anticoagulation chronically.    6.  Back pain,  -He typically request oxycodone when hospitalized.  This is was tapered from 10 mg doses to 5 mg doses refill hours, move at every 6 hours as tolerated, continue to taper. He did have lumbar laminectomy and June 2022 with significant improvement in pain, was off opiate pain medications, he appears of had some exacerbation of symptoms.    *DVT prophylaxis: He is fully ambulatory.    *Disposition: He will require over 2 midnights in the hospital.      Please page the [facility]) pager at [phone] with questions.      Consultants:   1.  Pulmonology      Subjective:   No fever, chills.  Breathing is a bit better.  No complaint of hemoptysis.    Objective:       Vital signs in last 24 hours:  Temp:  [36 ??C (96.8 ??F)-36.5 ??C (97.7 ??F)] 36.1 ??C (97 ??F)  Heart Rate:  [96-104] 100  Resp:  [18] 18  BP: (124-135)/(71-75) 135/71  MAP (mmHg):  [92] 92  SpO2:  [96 %-99 %] 99 %    Intake/Output last 24 hours:  No intake or output data in the 24 hours ending 11/05/21 1755      Physical Exam:    Gen: Alert, NAD    HEENT: No icterus.    CV: Regular rate and rhythm.    PULM/chest: Good air exchange, no wheezing.  Few crackles in right base.    YNW:GNFA, Non tender, No palpable organomegaly    Ext: No C/C/E    Neuro: No acute focal deficits.  Skin: Warm, dry.      Medications:   Scheduled Meds:  ??? albuterol  2.5 mg Nebulization 4x Daily (RT)   ??? amitriptyline  100 mg Oral Nightly   ??? atorvastatin  40 mg Oral Nightly   ??? cetirizine  10 mg Oral Daily   ??? colistimetate (COLISTIN) inhalation  150 mg Inhalation BID (RT)   ??? dornase alfa  2.5 mg Inhalation Daily (RT)   ??? elexacaftor-tezacaftor-ivacaft  2 tablet Oral daily    And   ??? elexacaftor-tezacaftor-ivacaft  1 tablet Oral Q PM   ??? FLUoxetine  60 mg Oral Daily   ??? fluticasone furoate-vilanteroL  1 puff Inhalation Daily (RT)   ??? fluticasone propionate  2 spray Each Nare Daily   ??? gabapentin  600 mg Oral TID   ??? insulin glargine  40 Units Subcutaneous Nightly   ??? insulin lispro  0-20 Units Subcutaneous ACHS   ??? insulin lispro  30 Units Subcutaneous 3xd Meals   ??? lamoTRIgine  400 mg Oral Daily   ??? levoFLOXacin  750 mg Oral Q24H SCH   ??? lisinopriL  10 mg Oral Daily   ??? montelukast  10 mg Oral Nightly   ??? pancrelipase (Lip-Prot-Amyl)  288,000 units of lipase Oral 3xd Meals   ??? pantoprazole  40 mg Oral BID   ??? MVW Complete (pediatric multivit 61-D3-vit K)  2 capsule Oral BID   ??? piperacillin-tazobactam (ZOSYN) IV (intermittent)  4.5 g Intravenous Q6H   ??? pramipexole  0.25 mg Oral Nightly   ??? rivaroxaban  20 mg Oral Daily   ??? sodium chloride 7%  4 mL Nebulization 4x Daily (RT)   ??? traZODone  150 mg Oral Nightly     Continuous Infusions:    Lab Results   Component Value Date    WBC 7.8 11/04/2021    HGB 14.2 11/04/2021    HCT 41.8 11/04/2021    PLT 230 11/04/2021     Lab Results   Component Value Date    BILITOT 0.6 11/05/2021    BILIDIR 0.20 08/02/2021    PROT 6.3 11/05/2021    ALBUMIN 3.4 11/05/2021    ALT 75 (H) 11/05/2021    AST 51 (H) 11/05/2021    ALKPHOS 108 11/05/2021       Lab Results   Component Value Date    PT 24.0 (H) 11/04/2021    INR 2.06 11/04/2021    APTT 31.6 11/03/2021     Recent Labs     11/04/21  0741 11/05/21  0808   NA 133* 136   K 4.2 3.9   CL 97* 98   CO2 24.9 29.0   BUN 14 11   CREATININE 0.84 0.87   GLU 234* 175   CALCIUM 9.1 8.8   PROT 7.0 6.3   BILITOT 0.8 0.6   AST 42* 51*   ALT 68* 75*   ALKPHOS 124* 108   MG 1.4*  --    PHOS 4.9  --      Recent Labs     11/03/21  1525 11/04/21  0741   INR 1.03 2.06   APTT 31.6  --      Recent Labs     11/03/21  1525   A1C 10.6*     Pending labs:   Pending Labs     Order Current Status    AFB SMEAR In process    AFB culture In process    IgE Total In process  IgE Total In process    CF Sputum/ CF Sinus Culture Preliminary result        Laboratory studies since admission reviewed.    25 minutes, over 50% spent in counseling and coordination of care.  Tawni Levy MD

## 2021-11-06 NOTE — Unmapped (Signed)
Problem: Adult Inpatient Plan of Care  Goal: Plan of Care Review  Outcome: Progressing     Problem: Airway Clearance Ineffective  Goal: Effective Airway Clearance  Outcome: Progressing    Remains on RA, inhaled medications given as ordered without complications or distress noted this shift. Performed home chest vest for mucus clearance independently.

## 2021-11-07 MED ADMIN — traZODone (DESYREL) tablet 150 mg: 150 mg | ORAL

## 2021-11-07 MED ADMIN — colistimethate (COLYMYCIN) 150 mg in sodium chloride (NS) 4 mL inhalation: 150 mg | RESPIRATORY_TRACT | @ 14:00:00

## 2021-11-07 MED ADMIN — albuterol 2.5 mg /3 mL (0.083 %) nebulizer solution 2.5 mg: 2.5 mg | RESPIRATORY_TRACT | @ 01:00:00

## 2021-11-07 MED ADMIN — albuterol 2.5 mg /3 mL (0.083 %) nebulizer solution 2.5 mg: 2.5 mg | RESPIRATORY_TRACT | @ 14:00:00

## 2021-11-07 MED ADMIN — insulin lispro (HumaLOG) injection 0-20 Units: 0-20 [IU] | SUBCUTANEOUS | @ 22:00:00

## 2021-11-07 MED ADMIN — levoFLOXacin (LEVAQUIN) tablet 750 mg: 750 mg | ORAL | @ 13:00:00 | Stop: 2021-11-19

## 2021-11-07 MED ADMIN — pantoprazole (PROTONIX) EC tablet 40 mg: 40 mg | ORAL

## 2021-11-07 MED ADMIN — gabapentin (NEURONTIN) capsule 600 mg: 600 mg | ORAL | @ 13:00:00

## 2021-11-07 MED ADMIN — lamoTRIgine (LaMICtal) tablet 400 mg: 400 mg | ORAL | @ 13:00:00

## 2021-11-07 MED ADMIN — pancrelipase (Lip-Prot-Amyl) (CREON) 24,000-76,000 -120,000 unit delayed release capsule 288,000 units of lipase: 288000 [IU] | ORAL | @ 13:00:00

## 2021-11-07 MED ADMIN — fluticasone furoate-vilanteroL (BREO ELLIPTA) 200-25 mcg/dose inhaler 1 puff: 1 | RESPIRATORY_TRACT | @ 14:00:00

## 2021-11-07 MED ADMIN — albuterol 2.5 mg /3 mL (0.083 %) nebulizer solution 2.5 mg: 2.5 mg | RESPIRATORY_TRACT | @ 19:00:00

## 2021-11-07 MED ADMIN — oxyCODONE (ROXICODONE) immediate release tablet 5 mg: 5 mg | ORAL | Stop: 2021-11-17

## 2021-11-07 MED ADMIN — montelukast (SINGULAIR) tablet 10 mg: 10 mg | ORAL

## 2021-11-07 MED ADMIN — sodium chloride 7% nebulizer solution 4 mL: 4 mL | RESPIRATORY_TRACT | @ 14:00:00

## 2021-11-07 MED ADMIN — pramipexole (MIRAPEX) tablet 0.25 mg: .25 mg | ORAL

## 2021-11-07 MED ADMIN — piperacillin-tazobactam (ZOSYN) IVPB (premix) 4.5 g: 4.5 g | INTRAVENOUS | @ 13:00:00 | Stop: 2021-11-10

## 2021-11-07 MED ADMIN — atorvastatin (LIPITOR) tablet 40 mg: 40 mg | ORAL

## 2021-11-07 MED ADMIN — pediatric multivitamin-vit D3 1,500 unit-vit K 800 mcg (MVW COMPLETE FORMULATION) capsule: 2 | ORAL | @ 13:00:00

## 2021-11-07 MED ADMIN — pancrelipase (Lip-Prot-Amyl) (CREON) 24,000-76,000 -120,000 unit delayed release capsule 288,000 units of lipase: 288000 [IU] | ORAL | @ 16:00:00

## 2021-11-07 MED ADMIN — insulin lispro (HumaLOG) injection 30 Units: 30 [IU] | SUBCUTANEOUS | @ 22:00:00

## 2021-11-07 MED ADMIN — lisinopriL (PRINIVIL,ZESTRIL) tablet 10 mg: 10 mg | ORAL | @ 13:00:00

## 2021-11-07 MED ADMIN — pediatric multivitamin-vit D3 1,500 unit-vit K 800 mcg (MVW COMPLETE FORMULATION) capsule: 2 | ORAL | @ 01:00:00

## 2021-11-07 MED ADMIN — dornase alfa (PULMOZYME) 1 mg/mL solution 2.5 mg: 2.5 mg | RESPIRATORY_TRACT | @ 01:00:00

## 2021-11-07 MED ADMIN — insulin lispro (HumaLOG) injection 30 Units: 30 [IU] | SUBCUTANEOUS | @ 13:00:00

## 2021-11-07 MED ADMIN — pancrelipase (Lip-Prot-Amyl) (CREON) 24,000-76,000 -120,000 unit delayed release capsule 288,000 units of lipase: 288000 [IU] | ORAL | @ 20:00:00

## 2021-11-07 MED ADMIN — sodium chloride 7% nebulizer solution 4 mL: 4 mL | RESPIRATORY_TRACT | @ 19:00:00

## 2021-11-07 MED ADMIN — insulin lispro (HumaLOG) injection 30 Units: 30 [IU] | SUBCUTANEOUS | @ 16:00:00

## 2021-11-07 MED ADMIN — dornase alfa (PULMOZYME) 1 mg/mL solution 2.5 mg: 2.5 mg | RESPIRATORY_TRACT | @ 14:00:00

## 2021-11-07 MED ADMIN — FLUoxetine (PROzac) capsule 60 mg: 60 mg | ORAL | @ 13:00:00

## 2021-11-07 MED ADMIN — insulin glargine (LANTUS) injection 40 Units: 40 [IU] | SUBCUTANEOUS | @ 01:00:00

## 2021-11-07 MED ADMIN — pantoprazole (PROTONIX) EC tablet 40 mg: 40 mg | ORAL | @ 13:00:00

## 2021-11-07 MED ADMIN — fluticasone propionate (FLONASE) 50 mcg/actuation nasal spray 2 spray: 2 | NASAL | @ 13:00:00

## 2021-11-07 MED ADMIN — piperacillin-tazobactam (ZOSYN) IVPB (premix) 4.5 g: 4.5 g | INTRAVENOUS | @ 08:00:00 | Stop: 2021-11-10

## 2021-11-07 MED ADMIN — cetirizine (ZyrTEC) tablet 10 mg: 10 mg | ORAL | @ 13:00:00

## 2021-11-07 MED ADMIN — piperacillin-tazobactam (ZOSYN) IVPB (premix) 4.5 g: 4.5 g | INTRAVENOUS | @ 20:00:00 | Stop: 2021-11-10

## 2021-11-07 MED ADMIN — rivaroxaban (XARELTO) tablet 20 mg: 20 mg | ORAL | @ 13:00:00

## 2021-11-07 MED ADMIN — colistimethate (COLYMYCIN) 150 mg in sodium chloride (NS) 4 mL inhalation: 150 mg | RESPIRATORY_TRACT | @ 01:00:00

## 2021-11-07 MED ADMIN — oxyCODONE (ROXICODONE) immediate release tablet 5 mg: 5 mg | ORAL | @ 16:00:00 | Stop: 2021-11-17

## 2021-11-07 MED ADMIN — gabapentin (NEURONTIN) capsule 600 mg: 600 mg | ORAL

## 2021-11-07 MED ADMIN — amitriptyline (ELAVIL) tablet 100 mg: 100 mg | ORAL

## 2021-11-07 MED ADMIN — elexacaftor-tezacaftor-ivacaft (TRIKAFTA) tablet 2 tablet  **Patient Supplied**: 2 | ORAL | @ 13:00:00

## 2021-11-07 MED ADMIN — piperacillin-tazobactam (ZOSYN) IVPB (premix) 4.5 g: 4.5 g | INTRAVENOUS | @ 01:00:00 | Stop: 2021-11-10

## 2021-11-07 MED ADMIN — gabapentin (NEURONTIN) capsule 600 mg: 600 mg | ORAL | @ 20:00:00

## 2021-11-07 MED ADMIN — elexacaftor-tezacaftor-ivacaft (TRIKAFTA) tablet 1 tablet **Patient Supplied**: 1 | ORAL

## 2021-11-07 MED ADMIN — sodium chloride 7% nebulizer solution 4 mL: 4 mL | RESPIRATORY_TRACT | @ 01:00:00

## 2021-11-07 NOTE — Unmapped (Incomplete)
Primary Team: Please copy and paste below information into home infusion referral:     Disciplines requested: Nursing and Home IV Antibiotics    Physician to follow patient's care (the person listed here will be responsible for signing ongoing orders): Driscilla Grammes, MD  Outpatient Pharmacist to follow IV antibiotic therapy: CF Patients (Last Names A-K): Alben Spittle, CPP    Inpatient Start Date of Antibiotics (Day 1): 11/03/2021  Start of Care Date (discharge date): ***  Estimate completion of therapy: ***  Home health/infusion please reach out to nurse contact (below) 2-3 days prior to end of therapy for appropriate orders  IV Access: {CVAD Access:[id number]}  Special Central line considerations: ***  (Allergies/Dressing preferences)      IV ANTIBIOTIC ORDERS:  ??? Piperacillin-tazobactam (Zosyn) 4.5g IV every 6 hours    **DO NOT DRAW DRUG LEVELS FROM PORT**    LAB MONITORING ORDERS:      Start labs on ***    ??? [Ceftazidime/Cefepime/Aztreonam/Ceftaroline/Piperacillin-tazobactam]-ONCE weekly (Mondays/Tuesdays): CBC with diff, BMP    Collect on MONDAYS of each week:  ??? CBC + diff  ??? Basic Metabolic Panel    Collect on THURSDAYS of each week (only for patients on aminoglycosides)  ???     TARGET DRUG LEVELS:  ??? -Not applicable    --------------------------------------------------------------------------------------  *PLEASE FAX RESULTS TO: [phone]    *PHONE CONTACT*:  ??? Adult CF Patients (Last name A to K): Nurse Coordinator-Nicole Bingham, RN [phone]  ??? For critical lab results or urgent matters outside of regular business hours (Mon through Fri 8:00AM-4:30PM, please call the hospital operator at [phone] and ask to page the pulmonary fellow on-call.    **Please warm hand-off to outpatient team nurse coordinator, pharmacist, and physician per contacts listed above**

## 2021-11-07 NOTE — Unmapped (Signed)
PULMONARY CONSULT  NOTE      Patient: Christian Young(05/21/1986)  Reason for consultation: Christian Young is a 36 y.o. male who is seen in consultation at the request of Rica Koyanagi, MD for comprehensive evaluation of Cystic Fibrosis with acute exacerbation.    Assessment and Recommendations:      Principal Problem:    Cystic fibrosis with pulmonary exacerbation (CMS-HCC)  Active Problems:    Essential hypertension    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)    Pancreatic insufficiency due to cystic fibrosis (CMS-HCC)    Chronic deep vein thrombosis (DVT) of lower extremity (CMS-HCC)    Long term current use of anticoagulant therapy  Resolved Problems:    * No resolved hospital problems. Christian Young is a 36 y.o. year old male with a history of cystic fibrosis (genetics [phone] and [phone] insertion) on Trikafta presenting for acute exacerbation of bronchiectasis.     Cystic Fibrosis with pulmonary manifestation with  acute exacerbation of bronchiectasis: Presentation consistent with a CF pulmonary exacerbation as characterized by cough, fatigue, increased SOB and increased sputum production.   - Piperacillin-tazobactam 4.5g IV q6hrs and levofloxacin 750mg  PO daily.  - Also continuing inhaled colistin as new suppressive regimen.  - Will followup sputum culture.  - Airway clearance with QID vest, albuterol, 7% HTS, Pulmozyme.  - Continue Breo daily.  - Continue Trikafta from patient home supply.  - PFTs done today, at baseline, I do not see need to repeat these later in the course.  - Vitamin K 5 mg twice weekly while on IV antibiotics (ordered).   - Twice weekly labs sufficient on this current antibiotics course   - For access, patient has Implanted Port, currently not using by patient preference, willing to allow accessing and use after current PIV expires.  - As noted below, patient does not want to return home for duration of his treatment course, wishes to complete inpatient (on or around 4/7 if 2 weeks is the intended plan).    Cystic Fibrosis with GI manifestations with  CF-related intestinal malabsorption (JXB:JYNW mass index is 32.34 kg/m??.)  - High-calorie, high-protein diet with double portions.   - Monitor with weights twice weekly. Consider CF nutrition consult.  - Continue Creon and MVW vitamins during admission  - due to check Vitamin A, D, E levels in 11/2021??- please obtain with labs at end of exacerbation course     Cystic Fibrosis with GI manifestations with  CF-related  diabetes  - monitor blood sugars frequently, glargine and lispro as ordered.    We appreciate the opportunity to assist in the care of this patient.  Please page [phone] with any questions.    Cecelia Byars, MD    Subjective:      History of Present Illness:  Christian Young is a 36 y.o. male admitted for CF with acute exacerbation.    He presents with his usual symptoms of cough, increased sputum, and SOB. He has had several exacerbations similar to this and is well known to the pulmonology service (see note by Dr. [name]).    Interval history 11/07/21: Feeling better so far into his course, still not where he usually is by discharge, but improving. Continues to get meds via PIV, nervous to test out his port because of reaction (pain/swelling) when doing home infusions last exacerbation, unsure if tobramycin reaction vs dressing reaction or other port problem. Willing to access/use port later in admission. Reports wife is ill  with respiratory infection and he does not want to go home to complete his antibiotic course, wants to complete it here.    Review of Systems: A comprehensive review of systems was performed and was negative except as above in HPI    Past medical, past surgical, family, and social histories reviewed from initial consult note and otherwise unchanged.    Medications reviewed in Epic.  Antibiotics with Zosyn and PO levofloxacin.    Allergies as of 11/03/2021 - Reviewed 11/03/2021   Allergen Reaction Noted   ??? Aztreonam Anaphylaxis, Hives, Nausea And Vomiting, and Rash 09/06/2015   ??? [place] [aztreonam lysine] Anaphylaxis 12/27/2016   ??? Cefepime Itching, Nausea Only, and Other (See Comments) 09/06/2015   ??? Other Anaphylaxis and Other (See Comments) 09/06/2015   ??? Slo-bid 100 Anaphylaxis 06/20/2017   ??? Tobramycin Tinnitus 09/06/2015   ??? Banana Itching and Nausea And Vomiting 07/29/2016        Objective:      Physical Exam:  Vitals:    11/06/21 1819 11/06/21 2034 11/06/21 2058 11/07/21 0800   BP: 130/75 134/73  105/59   Pulse: 81 95 92 76   Resp: 17 17 18 18    Temp:  36.7 ??C (98.1 ??F)  36 ??C (96.8 ??F)   TempSrc:  Oral  Oral   SpO2: 97% 96% 98% 94%   Weight:       Height:         GEN: NAD, sitting in chair  EYES: EOMI, sclera anicteric  ENT: Trachea midline, MMM  CV: RRR, no murmurs appreciated  PULM: coarse breath sounds, no wheezes or crackles appreciated, normal WoB, no stridor  ABD: soft, non-tender, non-distended  EXT: No edema  NEURO: Grossly Non-focal, moving all extremities normally  PSYCH: A+Ox3, appropriate         Diagnostic Review:   All labs and images were personally reviewed.    Sputum culture 3/25: 2+ mucoid PsA, 2+ OP flora, otherwise pending    FVL 11/07/21: FEV1 3.26 (70%), FVC 4.28 (74%), 0.76  Overall similar to last study

## 2021-11-07 NOTE — Unmapped (Signed)
[facility] Hospitalist Progress Note     LOS: 3 days       Assessment/Plan:  Principal Problem:    Cystic fibrosis with pulmonary exacerbation (CMS-HCC)  Active Problems:    Essential hypertension    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)    Pancreatic insufficiency due to cystic fibrosis (CMS-HCC)    Chronic deep vein thrombosis (DVT) of lower extremity (CMS-HCC)    Long term current use of anticoagulant therapy  Resolved Problems:    * No resolved hospital problems. *         36 year old Caucasian male well-known to the hospital service for exacerbation of cystic fibrosis, admitted for IV antibiotics and managed of multiple comorbidities.    1.  CF exacerbation.  -Continue current antibiotic and airway management per Pulmonology recommendation  Shown below..    2.  Type 2 diabetes mellitus.  He is on an insulin pump at home but his hemoglobin A1c from available labs 1 year ago was 10.7 and was even higher 11.7 8  months ago.  It was 10.6 on this admission so it appears his home insulin pump management has not resulted in overall good glycemic control.  The patient fortunately was very frank regarding his eating habits and notes there is room for improvement.  He notes that if he makes a portion of food, he will eat all of it, for example a large portion of spaghetti.  He is most agreeable to education and Diabetes consultation for carbohydrate counting, healthy eating and consistent carbohydrate concepts are requested.  Continue with Lantus and Premeal insulin as well as sliding scale, ACHS Accu-Cheks.    3.  Essential hypertension.  Continue current regimen    4.  Pancreatic insufficiency, continue pancreatic enzyme therapy.    5.  History of DVT, continue anticoagulation chronically.    6.  Back pain,  -He typically request oxycodone when hospitalized.  This is was tapered from 10 mg doses to 5 mg doses refill hours, move at every 6 hours as tolerated, continue to taper. He did have lumbar laminectomy and June 2022 with significant improvement in pain, was off opiate pain medications, he appears of had some exacerbation of symptoms.    *DVT prophylaxis: He is fully ambulatory.    *Disposition: He will require over 2 midnights in the hospital.      Please page the [facility]) pager at [phone] with questions.      Consultants:   1.  Pulmonology      Subjective:   No fever, chills.  Breathing is a bit better.  No complaint of hemoptysis.    Objective:       Vital signs in last 24 hours:  Temp:  [36.9 ??C (98.4 ??F)-37 ??C (98.6 ??F)] 37 ??C (98.6 ??F)  Heart Rate:  [79-106] 81  Resp:  [17-18] 17  BP: (118-132)/(75-83) 130/75  MAP (mmHg):  [92-96] 96  SpO2:  [97 %-99 %] 97 %    Intake/Output last 24 hours:  No intake or output data in the 24 hours ending 11/06/21 1907      Physical Exam:    Gen: Alert, NAD    HEENT: No icterus.    CV: Regular rate and rhythm.    PULM/chest: Good air exchange, no wheezing.  Few crackles in right base.    ZDG:LOVF, Non tender, No palpable organomegaly    Ext: No C/C/E    Neuro: No acute focal deficits.  Skin: Warm, dry.      Medications:   Scheduled Meds:  ??? albuterol  2.5 mg Nebulization 4x Daily (RT)   ??? amitriptyline  100 mg Oral Nightly   ??? atorvastatin  40 mg Oral Nightly   ??? cetirizine  10 mg Oral Daily   ??? colistimetate (COLISTIN) inhalation  150 mg Inhalation BID (RT)   ??? dornase alfa  2.5 mg Inhalation Daily (RT)   ??? elexacaftor-tezacaftor-ivacaft  2 tablet Oral daily    And   ??? elexacaftor-tezacaftor-ivacaft  1 tablet Oral Q PM   ??? FLUoxetine  60 mg Oral Daily   ??? fluticasone furoate-vilanteroL  1 puff Inhalation Daily (RT)   ??? fluticasone propionate  2 spray Each Nare Daily   ??? gabapentin  600 mg Oral TID   ??? insulin glargine  40 Units Subcutaneous Nightly   ??? insulin lispro  0-20 Units Subcutaneous ACHS   ??? insulin lispro  30 Units Subcutaneous 3xd Meals   ??? lamoTRIgine  400 mg Oral Daily   ??? levoFLOXacin  750 mg Oral Q24H SCH   ??? lisinopriL  10 mg Oral Daily   ??? montelukast  10 mg Oral Nightly   ??? pancrelipase (Lip-Prot-Amyl)  288,000 units of lipase Oral 3xd Meals   ??? pantoprazole  40 mg Oral BID   ??? MVW Complete (pediatric multivit 61-D3-vit K)  2 capsule Oral BID   ??? piperacillin-tazobactam (ZOSYN) IV (intermittent)  4.5 g Intravenous Q6H   ??? pramipexole  0.25 mg Oral Nightly   ??? rivaroxaban  20 mg Oral Daily   ??? sodium chloride 7%  4 mL Nebulization 4x Daily (RT)   ??? traZODone  150 mg Oral Nightly     Continuous Infusions:    Lab Results   Component Value Date    WBC 7.8 11/04/2021    HGB 14.2 11/04/2021    HCT 41.8 11/04/2021    PLT 230 11/04/2021     Lab Results   Component Value Date    BILITOT 0.6 11/05/2021    BILIDIR 0.20 08/02/2021    PROT 6.3 11/05/2021    ALBUMIN 3.4 11/05/2021    ALT 75 (H) 11/05/2021    AST 51 (H) 11/05/2021    ALKPHOS 108 11/05/2021       Lab Results   Component Value Date    PT 24.0 (H) 11/04/2021    INR 2.06 11/04/2021    APTT 31.6 11/03/2021     Recent Labs     11/04/21  0741 11/05/21  0808   NA 133* 136   K 4.2 3.9   CL 97* 98   CO2 24.9 29.0   BUN 14 11   CREATININE 0.84 0.87   GLU 234* 175   CALCIUM 9.1 8.8   PROT 7.0 6.3   BILITOT 0.8 0.6   AST 42* 51*   ALT 68* 75*   ALKPHOS 124* 108   MG 1.4*  --    PHOS 4.9  --      Recent Labs     11/04/21  0741   INR 2.06     No results for input(s): OCCULTBLD, RAPSCRN, CDIFRPCR, CDIFFNAP1, A1C in the last 72 hours.  Pending labs:   Pending Labs     Order Current Status    AFB culture In process    IgE Total In process    IgE Total In process    CF Sputum/ CF Sinus Culture Preliminary result  Laboratory studies since admission reviewed.    25 minutes, over 50% spent in counseling and coordination of care.  Tawni Levy MD    [facility] Hospitalist Progress Note     LOS: 3 days       Assessment/Plan:  Principal Problem:    Cystic fibrosis with pulmonary exacerbation (CMS-HCC)  Active Problems:    Essential hypertension 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)    Pancreatic insufficiency due to cystic fibrosis (CMS-HCC)    Chronic deep vein thrombosis (DVT) of lower extremity (CMS-HCC)    Long term current use of anticoagulant therapy  Resolved Problems:    * No resolved hospital problems. *         36 year old Caucasian male well-known to the hospital service for exacerbation of cystic fibrosis, admitted for IV antibiotics and managed of multiple comorbidities.    1.  CF exacerbation.  -Continue current antibiotic and airway management per Pulmonology recommendation.  The patient has been reluctant to allow use of his left subclavian port due to a reaction he had during the last infusion of tobramycin.  He is not getting tobramycin this hospitalization and agrees to allow access to the port and infusion of his antibiotics.  He has good peripheral access at this time we will continue to use this but access port in a sensible manner with respect to coordinating his discharge to home.  Will discuss with Pulmonology in a.m. discharge disposition.  Medications as shown below.  -Continue oxycodone, taper to 5 mg every 6 hours.  The patient says he is having some inflammatory chest pain with coughing secondary to exacerbation of his CF.  He typically requests oxycodone when hospitalized for this type of discomfort.  We discussed tapering his dose down to his symptoms with  so that upon discharge, when he typically has not been prescribed oxycodone, he does not experience any withdrawal symptoms.    2.  Type 2 diabetes mellitus.  He is on an insulin pump at home but his hemoglobin A1c from available labs 1 year ago was 10.7 and was even higher 11.7 8  months ago.  Appreciate dietitian consult.  Continue with Lantus and Premeal insulin as well as sliding scale, ACHS Accu-Cheks.    3.  Essential hypertension.  Continue current regimen    4.  Pancreatic insufficiency, continue pancreatic enzyme therapy.    5.  History of DVT, continue anticoagulation chronically.      *DVT prophylaxis: He is fully ambulatory.    *Disposition: He will require over 2 midnights in the hospital.      Please page the [facility]) pager at [phone] with questions.      Consultants:   1.  Pulmonology      Subjective:   No fever, chills.  Breathing is a bit better.  No complaint of hemoptysis.    Objective:       Vital signs in last 24 hours:  Temp:  [36.9 ??C (98.4 ??F)-37 ??C (98.6 ??F)] 37 ??C (98.6 ??F)  Heart Rate:  [79-106] 81  Resp:  [17-18] 17  BP: (118-132)/(75-83) 130/75  MAP (mmHg):  [92-96] 96  SpO2:  [97 %-99 %] 97 %    Intake/Output last 24 hours:  No intake or output data in the 24 hours ending 11/06/21 1907      Physical Exam:    Gen: Alert, NAD    HEENT: No icterus.    CV: Regular rate and rhythm.  PULM/chest: Good air exchange, no wheezing.  Few crackles in right base.    ZOX:WRUE, Non tender, No palpable organomegaly    Ext: No C/C/E    Neuro: No acute focal deficits.    Skin: Warm, dry.      Medications:   Scheduled Meds:  ??? albuterol  2.5 mg Nebulization 4x Daily (RT)   ??? amitriptyline  100 mg Oral Nightly   ??? atorvastatin  40 mg Oral Nightly   ??? cetirizine  10 mg Oral Daily   ??? colistimetate (COLISTIN) inhalation  150 mg Inhalation BID (RT)   ??? dornase alfa  2.5 mg Inhalation Daily (RT)   ??? elexacaftor-tezacaftor-ivacaft  2 tablet Oral daily    And   ??? elexacaftor-tezacaftor-ivacaft  1 tablet Oral Q PM   ??? FLUoxetine  60 mg Oral Daily   ??? fluticasone furoate-vilanteroL  1 puff Inhalation Daily (RT)   ??? fluticasone propionate  2 spray Each Nare Daily   ??? gabapentin  600 mg Oral TID   ??? insulin glargine  40 Units Subcutaneous Nightly   ??? insulin lispro  0-20 Units Subcutaneous ACHS   ??? insulin lispro  30 Units Subcutaneous 3xd Meals   ??? lamoTRIgine  400 mg Oral Daily   ??? levoFLOXacin  750 mg Oral Q24H SCH   ??? lisinopriL  10 mg Oral Daily   ??? montelukast  10 mg Oral Nightly   ??? pancrelipase (Lip-Prot-Amyl)  288,000 units of lipase Oral 3xd Meals   ??? pantoprazole  40 mg Oral BID   ??? MVW Complete (pediatric multivit 61-D3-vit K)  2 capsule Oral BID   ??? piperacillin-tazobactam (ZOSYN) IV (intermittent)  4.5 g Intravenous Q6H   ??? pramipexole  0.25 mg Oral Nightly   ??? rivaroxaban  20 mg Oral Daily   ??? sodium chloride 7%  4 mL Nebulization 4x Daily (RT)   ??? traZODone  150 mg Oral Nightly     Continuous Infusions:    Lab Results   Component Value Date    WBC 7.8 11/04/2021    HGB 14.2 11/04/2021    HCT 41.8 11/04/2021    PLT 230 11/04/2021     Lab Results   Component Value Date    BILITOT 0.6 11/05/2021    BILIDIR 0.20 08/02/2021    PROT 6.3 11/05/2021    ALBUMIN 3.4 11/05/2021    ALT 75 (H) 11/05/2021    AST 51 (H) 11/05/2021    ALKPHOS 108 11/05/2021       Lab Results   Component Value Date    PT 24.0 (H) 11/04/2021    INR 2.06 11/04/2021    APTT 31.6 11/03/2021     Recent Labs     11/04/21  0741 11/05/21  0808   NA 133* 136   K 4.2 3.9   CL 97* 98   CO2 24.9 29.0   BUN 14 11   CREATININE 0.84 0.87   GLU 234* 175   CALCIUM 9.1 8.8   PROT 7.0 6.3   BILITOT 0.8 0.6   AST 42* 51*   ALT 68* 75*   ALKPHOS 124* 108   MG 1.4*  --    PHOS 4.9  --      Recent Labs     11/04/21  0741   INR 2.06     No results for input(s): OCCULTBLD, RAPSCRN, CDIFRPCR, CDIFFNAP1, A1C in the last 72 hours.  Pending labs:   Pending Labs     Order Current  Status    AFB culture In process    IgE Total In process    IgE Total In process    CF Sputum/ CF Sinus Culture Preliminary result        Laboratory studies since admission reviewed.    25 minutes, over 50% spent in counseling and coordination of care.  Tawni Levy MD

## 2021-11-07 NOTE — Unmapped (Signed)
Patient alert and oriented x4.  VSS.  Ambulates independently.  Continent of bowel and bladder.  Medicated with prn oxycodone for complaint of pain.  Fingerstick blood glucose monitoring with sliding scale and scheduled coverage.  Takes meds whole without difficulty.  Call light in reach.  Problem: Adult Inpatient Plan of Care  Goal: Plan of Care Review  Outcome: Progressing  Goal: Patient-Specific Goal (Individualized)  Outcome: Progressing  Goal: Absence of Hospital-Acquired Illness or Injury  Outcome: Progressing  Intervention: Identify and Manage Fall Risk  Recent Flowsheet Documentation  Taken 11/07/2021 1400 by Jarrett Soho, RN  Safety Interventions: fall reduction program maintained  Taken 11/07/2021 1200 by Jarrett Soho, RN  Safety Interventions: fall reduction program maintained  Taken 11/07/2021 1000 by Jarrett Soho, RN  Safety Interventions: fall reduction program maintained  Taken 11/07/2021 0800 by Jarrett Soho, RN  Safety Interventions: fall reduction program maintained  Goal: Optimal Comfort and Wellbeing  Outcome: Progressing  Goal: Readiness for Transition of Care  Outcome: Progressing  Goal: Rounds/Family Conference  Outcome: Progressing     Problem: Infection  Goal: Absence of Infection Signs and Symptoms  Outcome: Progressing  Intervention: Prevent or Manage Infection  Recent Flowsheet Documentation  Taken 11/07/2021 1400 by Jarrett Soho, RN  Isolation Precautions: contact precautions maintained  Taken 11/07/2021 1000 by Jarrett Soho, RN  Isolation Precautions: contact precautions maintained  Taken 11/07/2021 0800 by Jarrett Soho, RN  Isolation Precautions: contact precautions maintained     Problem: Airway Clearance Ineffective  Goal: Effective Airway Clearance  Outcome: Progressing     Problem: Diabetes Comorbidity  Goal: Blood Glucose Level Within Targeted Range  Outcome: Progressing

## 2021-11-07 NOTE — Unmapped (Signed)
Pt remains on IV abx overnight. PRN pain meds given for c/o rib discomfort, effective. No other complaints or concerns thus far. Will cont to monitor.    Problem: Adult Inpatient Plan of Care  Goal: Plan of Care Review  Outcome: Progressing  Goal: Patient-Specific Goal (Individualized)  Outcome: Progressing  Goal: Absence of Hospital-Acquired Illness or Injury  Outcome: Progressing  Intervention: Identify and Manage Fall Risk  Recent Flowsheet Documentation  Taken 11/06/2021 2000 by Renette Butters, RN ADN  Safety Interventions:   fall reduction program maintained   isolation precautions   low bed  Intervention: Prevent and Manage VTE (Venous Thromboembolism) Risk  Recent Flowsheet Documentation  Taken 11/06/2021 2000 by Renette Butters, RN ADN  Activity Management: activity adjusted per tolerance  Goal: Optimal Comfort and Wellbeing  Outcome: Progressing  Goal: Readiness for Transition of Care  Outcome: Progressing  Goal: Rounds/Family Conference  Outcome: Progressing     Problem: Infection  Goal: Absence of Infection Signs and Symptoms  Outcome: Progressing  Intervention: Prevent or Manage Infection  Recent Flowsheet Documentation  Taken 11/06/2021 2000 by Renette Butters, RN ADN  Isolation Precautions: contact precautions maintained     Problem: Airway Clearance Ineffective  Goal: Effective Airway Clearance  Outcome: Progressing  Intervention: Promote Airway Secretion Clearance  Recent Flowsheet Documentation  Taken 11/06/2021 2000 by Renette Butters, RN ADN  Activity Management: activity adjusted per tolerance     Problem: Diabetes Comorbidity  Goal: Blood Glucose Level Within Targeted Range  Outcome: Progressing

## 2021-11-08 LAB — IGE
TOTAL IGE: 19.8 [IU]/mL
TOTAL IGE: 18.2 [IU]/mL

## 2021-11-08 MED ADMIN — pancrelipase (Lip-Prot-Amyl) (CREON) 24,000-76,000 -120,000 unit delayed release capsule 288,000 units of lipase: 288000 [IU] | ORAL | @ 13:00:00

## 2021-11-08 MED ADMIN — albuterol 2.5 mg /3 mL (0.083 %) nebulizer solution 2.5 mg: 2.5 mg | RESPIRATORY_TRACT | @ 14:00:00

## 2021-11-08 MED ADMIN — insulin lispro (HumaLOG) injection 20 Units: 20 [IU] | SUBCUTANEOUS | @ 22:00:00 | Stop: 2021-11-08

## 2021-11-08 MED ADMIN — lisinopriL (PRINIVIL,ZESTRIL) tablet 10 mg: 10 mg | ORAL | @ 13:00:00

## 2021-11-08 MED ADMIN — rivaroxaban (XARELTO) tablet 20 mg: 20 mg | ORAL | @ 13:00:00

## 2021-11-08 MED ADMIN — sodium chloride 7% nebulizer solution 4 mL: 4 mL | RESPIRATORY_TRACT | @ 18:00:00

## 2021-11-08 MED ADMIN — piperacillin-tazobactam (ZOSYN) IVPB (premix) 4.5 g: 4.5 g | INTRAVENOUS | @ 09:00:00 | Stop: 2021-11-10

## 2021-11-08 MED ADMIN — albuterol 2.5 mg /3 mL (0.083 %) nebulizer solution 2.5 mg: 2.5 mg | RESPIRATORY_TRACT | @ 01:00:00

## 2021-11-08 MED ADMIN — gabapentin (NEURONTIN) capsule 600 mg: 600 mg | ORAL | @ 21:00:00

## 2021-11-08 MED ADMIN — amitriptyline (ELAVIL) tablet 100 mg: 100 mg | ORAL | @ 01:00:00

## 2021-11-08 MED ADMIN — gabapentin (NEURONTIN) capsule 600 mg: 600 mg | ORAL | @ 13:00:00

## 2021-11-08 MED ADMIN — pantoprazole (PROTONIX) EC tablet 40 mg: 40 mg | ORAL | @ 13:00:00

## 2021-11-08 MED ADMIN — cetirizine (ZyrTEC) tablet 10 mg: 10 mg | ORAL | @ 13:00:00

## 2021-11-08 MED ADMIN — dornase alfa (PULMOZYME) 1 mg/mL solution 2.5 mg: 2.5 mg | RESPIRATORY_TRACT | @ 14:00:00

## 2021-11-08 MED ADMIN — fluticasone furoate-vilanteroL (BREO ELLIPTA) 200-25 mcg/dose inhaler 1 puff: 1 | RESPIRATORY_TRACT | @ 14:00:00

## 2021-11-08 MED ADMIN — sodium chloride 7% nebulizer solution 4 mL: 4 mL | RESPIRATORY_TRACT | @ 01:00:00

## 2021-11-08 MED ADMIN — gabapentin (NEURONTIN) capsule 600 mg: 600 mg | ORAL | @ 01:00:00

## 2021-11-08 MED ADMIN — FLUoxetine (PROzac) capsule 60 mg: 60 mg | ORAL | @ 13:00:00

## 2021-11-08 MED ADMIN — insulin lispro (HumaLOG) injection 0-20 Units: 0-20 [IU] | SUBCUTANEOUS | @ 16:00:00

## 2021-11-08 MED ADMIN — insulin lispro (HumaLOG) injection 0-20 Units: 0-20 [IU] | SUBCUTANEOUS | @ 13:00:00

## 2021-11-08 MED ADMIN — pediatric multivitamin-vit D3 1,500 unit-vit K 800 mcg (MVW COMPLETE FORMULATION) capsule: 2 | ORAL | @ 01:00:00

## 2021-11-08 MED ADMIN — piperacillin-tazobactam (ZOSYN) IVPB (premix) 4.5 g: 4.5 g | INTRAVENOUS | @ 13:00:00 | Stop: 2021-11-10

## 2021-11-08 MED ADMIN — heparin, porcine (PF) 100 unit/mL injection 300 Units: 300 [IU] | INTRAVENOUS | @ 13:00:00

## 2021-11-08 MED ADMIN — lamoTRIgine (LaMICtal) tablet 400 mg: 400 mg | ORAL | @ 13:00:00

## 2021-11-08 MED ADMIN — insulin lispro (HumaLOG) injection 30 Units: 30 [IU] | SUBCUTANEOUS | @ 16:00:00

## 2021-11-08 MED ADMIN — montelukast (SINGULAIR) tablet 10 mg: 10 mg | ORAL | @ 01:00:00

## 2021-11-08 MED ADMIN — atorvastatin (LIPITOR) tablet 40 mg: 40 mg | ORAL | @ 01:00:00

## 2021-11-08 MED ADMIN — insulin lispro (HumaLOG) injection 0-20 Units: 0-20 [IU] | SUBCUTANEOUS | @ 01:00:00

## 2021-11-08 MED ADMIN — sodium chloride 7% nebulizer solution 4 mL: 4 mL | RESPIRATORY_TRACT | @ 22:00:00

## 2021-11-08 MED ADMIN — albuterol 2.5 mg /3 mL (0.083 %) nebulizer solution 2.5 mg: 2.5 mg | RESPIRATORY_TRACT | @ 22:00:00

## 2021-11-08 MED ADMIN — pancrelipase (Lip-Prot-Amyl) (CREON) 24,000-76,000 -120,000 unit delayed release capsule 288,000 units of lipase: 288000 [IU] | ORAL | @ 21:00:00

## 2021-11-08 MED ADMIN — pramipexole (MIRAPEX) tablet 0.25 mg: .25 mg | ORAL | @ 01:00:00

## 2021-11-08 MED ADMIN — pantoprazole (PROTONIX) EC tablet 40 mg: 40 mg | ORAL | @ 01:00:00

## 2021-11-08 MED ADMIN — elexacaftor-tezacaftor-ivacaft (TRIKAFTA) tablet 2 tablet  **Patient Supplied**: 2 | ORAL | @ 13:00:00

## 2021-11-08 MED ADMIN — pancrelipase (Lip-Prot-Amyl) (CREON) 24,000-76,000 -120,000 unit delayed release capsule 288,000 units of lipase: 288000 [IU] | ORAL | @ 16:00:00

## 2021-11-08 MED ADMIN — sodium chloride 7% nebulizer solution 4 mL: 4 mL | RESPIRATORY_TRACT | @ 14:00:00

## 2021-11-08 MED ADMIN — elexacaftor-tezacaftor-ivacaft (TRIKAFTA) tablet 1 tablet **Patient Supplied**: 1 | ORAL | @ 01:00:00

## 2021-11-08 MED ADMIN — piperacillin-tazobactam (ZOSYN) IVPB (premix) 4.5 g: 4.5 g | INTRAVENOUS | @ 02:00:00 | Stop: 2021-11-10

## 2021-11-08 MED ADMIN — insulin lispro (HumaLOG) injection 30 Units: 30 [IU] | SUBCUTANEOUS | @ 13:00:00

## 2021-11-08 MED ADMIN — levoFLOXacin (LEVAQUIN) tablet 750 mg: 750 mg | ORAL | @ 13:00:00 | Stop: 2021-11-19

## 2021-11-08 MED ADMIN — pediatric multivitamin-vit D3 1,500 unit-vit K 800 mcg (MVW COMPLETE FORMULATION) capsule: 2 | ORAL | @ 13:00:00

## 2021-11-08 MED ADMIN — oxyCODONE (ROXICODONE) immediate release tablet 5 mg: 5 mg | ORAL | @ 16:00:00 | Stop: 2021-11-17

## 2021-11-08 MED ADMIN — oxyCODONE (ROXICODONE) immediate release tablet 5 mg: 5 mg | ORAL | @ 01:00:00 | Stop: 2021-11-17

## 2021-11-08 MED ADMIN — fluticasone propionate (FLONASE) 50 mcg/actuation nasal spray 2 spray: 2 | NASAL | @ 13:00:00

## 2021-11-08 MED ADMIN — traZODone (DESYREL) tablet 150 mg: 150 mg | ORAL | @ 01:00:00

## 2021-11-08 MED ADMIN — colistimethate (COLYMYCIN) 150 mg in sodium chloride (NS) 4 mL inhalation: 150 mg | RESPIRATORY_TRACT | @ 14:00:00

## 2021-11-08 MED ADMIN — colistimethate (COLYMYCIN) 150 mg in sodium chloride (NS) 4 mL inhalation: 150 mg | RESPIRATORY_TRACT | @ 01:00:00

## 2021-11-08 MED ADMIN — insulin glargine (LANTUS) injection 40 Units: 40 [IU] | SUBCUTANEOUS | @ 01:00:00

## 2021-11-08 MED ADMIN — piperacillin-tazobactam (ZOSYN) IVPB (premix) 4.5 g: 4.5 g | INTRAVENOUS | @ 21:00:00 | Stop: 2021-11-10

## 2021-11-08 MED ADMIN — albuterol 2.5 mg /3 mL (0.083 %) nebulizer solution 2.5 mg: 2.5 mg | RESPIRATORY_TRACT | @ 18:00:00

## 2021-11-08 NOTE — Unmapped (Signed)
Patient alert and oriented x4.  VSS.  Ambulates independently.  Continent of bowel and bladder.  Fingerstick blood glucose monitoring with sliding scale and scheduled coverage.  States knows signs/symptoms of low blood sugar.  Receiving IV antibiotics.  Call light in reach.  Problem: Adult Inpatient Plan of Care  Goal: Plan of Care Review  Outcome: Progressing  Goal: Patient-Specific Goal (Individualized)  Outcome: Progressing  Goal: Absence of Hospital-Acquired Illness or Injury  Outcome: Progressing  Intervention: Identify and Manage Fall Risk  Recent Flowsheet Documentation  Taken 11/08/2021 1200 by Jarrett Soho, RN  Safety Interventions: fall reduction program maintained  Taken 11/08/2021 1000 by Jarrett Soho, RN  Safety Interventions: fall reduction program maintained  Taken 11/08/2021 0800 by Jarrett Soho, RN  Safety Interventions: fall reduction program maintained  Goal: Optimal Comfort and Wellbeing  Outcome: Progressing  Goal: Readiness for Transition of Care  Outcome: Progressing  Goal: Rounds/Family Conference  Outcome: Progressing     Problem: Infection  Goal: Absence of Infection Signs and Symptoms  Outcome: Progressing  Intervention: Prevent or Manage Infection  Recent Flowsheet Documentation  Taken 11/08/2021 1200 by Jarrett Soho, RN  Isolation Precautions: contact precautions maintained  Taken 11/08/2021 1000 by Jarrett Soho, RN  Isolation Precautions: contact precautions maintained  Taken 11/08/2021 0800 by Jarrett Soho, RN  Isolation Precautions: contact precautions maintained     Problem: Airway Clearance Ineffective  Goal: Effective Airway Clearance  Outcome: Progressing     Problem: Diabetes Comorbidity  Goal: Blood Glucose Level Within Targeted Range  Outcome: Progressing

## 2021-11-08 NOTE — Unmapped (Signed)
PULMONARY CONSULT  NOTE      Patient: Christian Young(07-03-1986)  Reason for consultation: Christian Young is a 36 y.o. male who is seen in consultation at the request of Rica Koyanagi, MD for comprehensive evaluation of Cystic Fibrosis with acute exacerbation.    Assessment and Recommendations:      Principal Problem:    Cystic fibrosis with pulmonary exacerbation (CMS-HCC)  Active Problems:    Essential hypertension    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)    Pancreatic insufficiency due to cystic fibrosis (CMS-HCC)    Chronic deep vein thrombosis (DVT) of lower extremity (CMS-HCC)    Long term current use of anticoagulant therapy  Resolved Problems:    * No resolved hospital problems. Ab Leaming is a 36 y.o. year old male with a history of cystic fibrosis (genetics [phone] and [phone] insertion) on Trikafta presenting for acute exacerbation of bronchiectasis.     Cystic Fibrosis with pulmonary manifestation with  acute exacerbation of bronchiectasis: Presentation consistent with a CF pulmonary exacerbation as characterized by cough, fatigue, increased SOB and increased sputum production.   - Piperacillin-tazobactam 4.5g IV q6hrs and levofloxacin 750mg  PO daily.  - Also continuing inhaled colistin as new suppressive regimen.  - Will followup final results from sputum culture (PsA smooth and mucoid, Zosyn S, tobra S, levoflox R/I so far).  - Airway clearance with QID vest, albuterol, 7% HTS, Pulmozyme.  - Continue Breo daily.  - Continue Trikafta from patient home supply.  - PFTs done yesterday, at baseline, I do not see need to repeat these later in the course.  - Vitamin K 5 mg twice weekly while on IV antibiotics (ordered).   - Twice weekly labs sufficient on this current antibiotics course   - For access, patient has Implanted Port, now being used, no issues thus far.  - As noted previously, patient does not want to return home for duration of his treatment course, wishes to complete inpatient (on or around 4/7 if 2 weeks is the intended plan).    Cystic Fibrosis with GI manifestations with  CF-related intestinal malabsorption (AOZ:HYQM mass index is 32.34 kg/m??.)  - High-calorie, high-protein diet with double portions.   - Monitor with weights twice weekly. Consider CF nutrition consult.  - Continue Creon and MVW vitamins during admission  - due to check Vitamin A, D, E levels in 11/2021??- please obtain with labs at end of exacerbation course     Cystic Fibrosis with GI manifestations with  CF-related  diabetes  - monitor blood sugars frequently, glargine and lispro as ordered.    We appreciate the opportunity to assist in the care of this patient.  Please page [phone] with any questions.    Cecelia Byars, MD    Subjective:      History of Present Illness:  Christian Young is a 36 y.o. male admitted for CF with acute exacerbation.    He presents with his usual symptoms of cough, increased sputum, and SOB. He has had several exacerbations similar to this and is well known to the pulmonology service (see note by Dr. [name]).    Interval history 11/08/21: Feeling reasonably well. Port accessed and being used, feeling good with this, no pain or other worrisome reactions.     Review of Systems: A comprehensive review of systems was performed and was negative except as above in HPI    Past medical, past surgical, family, and social histories reviewed from initial consult  note and otherwise unchanged.    Medications reviewed in Epic.  Antibiotics with Zosyn and PO levofloxacin.    Allergies as of 11/03/2021 - Reviewed 11/03/2021   Allergen Reaction Noted   ??? Aztreonam Anaphylaxis, Hives, Nausea And Vomiting, and Rash 09/06/2015   ??? [place] [aztreonam lysine] Anaphylaxis 12/27/2016   ??? Cefepime Itching, Nausea Only, and Other (See Comments) 09/06/2015   ??? Other Anaphylaxis and Other (See Comments) 09/06/2015   ??? Slo-bid 100 Anaphylaxis 06/20/2017   ??? Tobramycin Tinnitus 09/06/2015   ??? Banana Itching and Nausea And Vomiting 07/29/2016        Objective:      Physical Exam:  Vitals:    11/07/21 2050 11/07/21 2330 11/08/21 0752 11/08/21 1025   BP:  130/77 118/69    Pulse:  91 82 100   Resp:  18 18 18    Temp:  36.6 ??C (97.9 ??F) 35.9 ??C (96.6 ??F)    TempSrc:  Oral Temporal    SpO2: 97% 96% 99% 99%   Weight:       Height:         GEN: NAD, sitting in chair  EYES: EOMI, sclera anicteric  ENT: Trachea midline, MMM  CV: RRR, no murmurs appreciated  PULM: mostly clear breath sounds, no wheezes, rare basilar crackles, normal WoB, no stridor  ABD: soft, non-tender, non-distended  EXT: No edema  NEURO: Grossly Non-focal, moving all extremities normally  PSYCH: A+Ox3, appropriate         Diagnostic Review:   All labs and images were personally reviewed.    Sputum culture 3/25: 2+ mucoid PsA, 2+ OP flora, otherwise pending    FVL 11/07/21: FEV1 3.26 (70%), FVC 4.28 (74%), 0.76  Overall similar to last study

## 2021-11-08 NOTE — Unmapped (Signed)
Inhaled medications were administered and tolerated well this shift. Patient is currently resting comfortably. Will continue to monitor.

## 2021-11-08 NOTE — Unmapped (Signed)
[facility] Internal Medicine Hospitalist Progress Note     LOS: 4 days       Assessment/Plan:  Principal Problem:    Cystic fibrosis with pulmonary exacerbation (CMS-HCC)  Active Problems:    Essential hypertension    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)    Pancreatic insufficiency due to cystic fibrosis (CMS-HCC)    Chronic deep vein thrombosis (DVT) of lower extremity (CMS-HCC)    Long term current use of anticoagulant therapy  Resolved Problems:    * No resolved hospital problems. *         36 year old Caucasian male well-known to the hospital service for exacerbation of cystic fibrosis, admitted for IV antibiotics and managed of multiple comorbidities.    1.  CF exacerbation.  -Continue current antibiotic and airway management per Pulmonology recommendation  Shown below..    2.  Type 2 diabetes mellitus.  He is on an insulin pump at home but his hemoglobin A1c from available labs 1 year ago was 10.7 and was even higher 11.7 8  months ago.  It was 10.6 on this admission so it appears his home insulin pump management has not resulted in overall good glycemic control.  The patient fortunately was very frank regarding his eating habits and notes there is room for improvement.  He notes that if he makes a portion of food, he will eat all of it, for example a large portion of spaghetti.  He is most agreeable to education and Diabetes consultation for carbohydrate counting, healthy eating and consistent carbohydrate concepts are requested.  Continue with Lantus and Premeal insulin as well as sliding scale, ACHS Accu-Cheks.    3.  Essential hypertension.  Continue current regimen    4.  Pancreatic insufficiency, continue pancreatic enzyme therapy.    5.  History of DVT, continue anticoagulation chronically.    6.  Back pain,  -He typically request oxycodone when hospitalized.  This is was tapered from 10 mg doses to 5 mg doses refill hours, move at every 6 hours as tolerated, continue to taper. He did have lumbar laminectomy and June 2022 with significant improvement in pain, was off opiate pain medications, he appears of had some exacerbation of symptoms.    *DVT prophylaxis: He is fully ambulatory.    *Disposition: He will require over 2 midnights in the hospital.      Please page the [facility]) pager at [phone] with questions.      Consultants:   1.  Pulmonology      Subjective:   No fever, chills.  Breathing is a bit better.  No complaint of hemoptysis.    Objective:       Vital signs in last 24 hours:  Temp:  [36 ??C (96.8 ??F)-36.7 ??C (98.1 ??F)] 36.7 ??C (98.1 ??F)  Heart Rate:  [76-97] 97  Resp:  [17-18] 18  BP: (105-135)/(59-77) 135/77  MAP (mmHg):  [77-99] 99  SpO2:  [94 %-98 %] 95 %    Intake/Output last 24 hours:  No intake or output data in the 24 hours ending 11/07/21 1714      Physical Exam:    Gen: Alert, NAD    HEENT: No icterus.    CV: Regular rate and rhythm.    PULM/chest: Good air exchange, no wheezing.  Few crackles in right base.    AVW:UJWJ, Non tender, No palpable organomegaly    Ext: No C/C/E    Neuro: No acute focal deficits.  Skin: Warm, dry.      Medications:   Scheduled Meds:  ??? albuterol  2.5 mg Nebulization 4x Daily (RT)   ??? amitriptyline  100 mg Oral Nightly   ??? atorvastatin  40 mg Oral Nightly   ??? cetirizine  10 mg Oral Daily   ??? colistimetate (COLISTIN) inhalation  150 mg Inhalation BID (RT)   ??? dornase alfa  2.5 mg Inhalation Daily (RT)   ??? elexacaftor-tezacaftor-ivacaft  2 tablet Oral daily    And   ??? elexacaftor-tezacaftor-ivacaft  1 tablet Oral Q PM   ??? FLUoxetine  60 mg Oral Daily   ??? fluticasone furoate-vilanteroL  1 puff Inhalation Daily (RT)   ??? fluticasone propionate  2 spray Each Nare Daily   ??? gabapentin  600 mg Oral TID   ??? [START ON 11/08/2021] heparin, porcine (PF)  300 Units Intravenous Q MWF   ??? insulin glargine  40 Units Subcutaneous Nightly   ??? insulin lispro  0-20 Units Subcutaneous ACHS   ??? insulin lispro  30 Units Subcutaneous 3xd Meals   ??? lamoTRIgine  400 mg Oral Daily   ??? levoFLOXacin  750 mg Oral Q24H SCH   ??? lisinopriL  10 mg Oral Daily   ??? montelukast  10 mg Oral Nightly   ??? pancrelipase (Lip-Prot-Amyl)  288,000 units of lipase Oral 3xd Meals   ??? pantoprazole  40 mg Oral BID   ??? MVW Complete (pediatric multivit 61-D3-vit K)  2 capsule Oral BID   ??? [START ON 11/09/2021] phytonadione (vitamin K1)  5 mg Oral Once per day on Mon Thu   ??? piperacillin-tazobactam (ZOSYN) IV (intermittent)  4.5 g Intravenous Q6H   ??? pramipexole  0.25 mg Oral Nightly   ??? rivaroxaban  20 mg Oral Daily   ??? sodium chloride 7%  4 mL Nebulization 4x Daily (RT)   ??? traZODone  150 mg Oral Nightly     Continuous Infusions:    Lab Results   Component Value Date    WBC 7.8 11/04/2021    HGB 14.2 11/04/2021    HCT 41.8 11/04/2021    PLT 230 11/04/2021     Lab Results   Component Value Date    BILITOT 0.6 11/05/2021    BILIDIR 0.20 08/02/2021    PROT 6.3 11/05/2021    ALBUMIN 3.4 11/05/2021    ALT 75 (H) 11/05/2021    AST 51 (H) 11/05/2021    ALKPHOS 108 11/05/2021       Lab Results   Component Value Date    PT 24.0 (H) 11/04/2021    INR 2.06 11/04/2021    APTT 31.6 11/03/2021     Recent Labs     11/05/21  0808   NA 136   K 3.9   CL 98   CO2 29.0   BUN 11   CREATININE 0.87   GLU 175   CALCIUM 8.8   PROT 6.3   BILITOT 0.6   AST 51*   ALT 75*   ALKPHOS 108     No results for input(s): CKTOTAL, CKMB, PCTCKMB, TROPONINI, EDTPNI, BNP, INR, LABPROT, APTT, DDIMER in the last 72 hours.  No results for input(s): OCCULTBLD, RAPSCRN, CDIFRPCR, CDIFFNAP1, A1C in the last 72 hours.  Pending labs:   Pending Labs     Order Current Status    AFB culture In process    IgE Total In process    IgE Total In process    CF Sputum/ CF Sinus Culture Preliminary  result        Laboratory studies since admission reviewed.    25 minutes, over 50% spent in counseling and coordination of care.  Tawni Levy MD    [facility] Internal Medicine Hospitalist Progress Note     LOS: 4 days Assessment/Plan:  Principal Problem:    Cystic fibrosis with pulmonary exacerbation (CMS-HCC)  Active Problems:    Essential hypertension    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)    Pancreatic insufficiency due to cystic fibrosis (CMS-HCC)    Chronic deep vein thrombosis (DVT) of lower extremity (CMS-HCC)    Long term current use of anticoagulant therapy  Resolved Problems:    * No resolved hospital problems. *         36 year old Caucasian male well-known to the hospital service for exacerbation of cystic fibrosis, admitted for IV antibiotics and managed of multiple comorbidities.    1.  CF exacerbation.  -Continue current antibiotic and airway management per Pulmonology recommendation.  -Access central venous port today, patient would prefer to stay in the hospital for the duration of his intravenous antibiotics.  He reports that his wife is currently ill with a respiratory viral syndrome.  COVID status unknown.  -Continue oxycodone,  5 mg every 6 hours.  This has not been prescribed on discharge previously.  Continue to taper as tolerated.    2.  Type 2 diabetes mellitus.  He is on an insulin pump at home but his hemoglobin A1c from available labs 1 year ago was 10.7 and was even higher 11.7 8  months ago.  Appreciate dietitian consult.  Continue with Lantus and Premeal insulin as well as sliding scale, ACHS Accu-Cheks.    3.  Essential hypertension.  Continue current regimen    4.  Pancreatic insufficiency, continue pancreatic enzyme therapy.    5.  History of DVT, continue anticoagulation chronically.      *DVT prophylaxis: He is fully ambulatory.    *Disposition: He will require over 2 midnights in the hospital.      Please page the [facility]) pager at [phone] with questions.      Consultants:   1.  Pulmonology      Subjective:   No fever, chills.  In good spirits.    Objective:       Vital signs in last 24 hours:  Temp:  [36 ??C (96.8 ??F)-36.7 ??C (98.1 ??F)] 36.7 ??C (98.1 ??F)  Heart Rate:  [76-97] 97  Resp:  [17-18] 18  BP: (105-135)/(59-77) 135/77  MAP (mmHg):  [77-99] 99  SpO2:  [94 %-98 %] 95 %    Intake/Output last 24 hours:  No intake or output data in the 24 hours ending 11/07/21 1714      Physical Exam:    Gen: Alert, NAD    HEENT: No icterus.    CV: Regular rate and rhythm.    PULM/chest: Normal work of breathing, no adventitial sounds appreciated.    MVH:QION, Non tender, No palpable organomegaly    Ext: No C/C/E    Neuro: No acute focal deficits.    Skin: Warm, dry.      Medications:   Scheduled Meds:  ??? albuterol  2.5 mg Nebulization 4x Daily (RT)   ??? amitriptyline  100 mg Oral Nightly   ??? atorvastatin  40 mg Oral Nightly   ??? cetirizine  10 mg Oral Daily   ??? colistimetate (COLISTIN) inhalation  150 mg Inhalation BID (RT)   ??? dornase  alfa  2.5 mg Inhalation Daily (RT)   ??? elexacaftor-tezacaftor-ivacaft  2 tablet Oral daily    And   ??? elexacaftor-tezacaftor-ivacaft  1 tablet Oral Q PM   ??? FLUoxetine  60 mg Oral Daily   ??? fluticasone furoate-vilanteroL  1 puff Inhalation Daily (RT)   ??? fluticasone propionate  2 spray Each Nare Daily   ??? gabapentin  600 mg Oral TID   ??? [START ON 11/08/2021] heparin, porcine (PF)  300 Units Intravenous Q MWF   ??? insulin glargine  40 Units Subcutaneous Nightly   ??? insulin lispro  0-20 Units Subcutaneous ACHS   ??? insulin lispro  30 Units Subcutaneous 3xd Meals   ??? lamoTRIgine  400 mg Oral Daily   ??? levoFLOXacin  750 mg Oral Q24H SCH   ??? lisinopriL  10 mg Oral Daily   ??? montelukast  10 mg Oral Nightly   ??? pancrelipase (Lip-Prot-Amyl)  288,000 units of lipase Oral 3xd Meals   ??? pantoprazole  40 mg Oral BID   ??? MVW Complete (pediatric multivit 61-D3-vit K)  2 capsule Oral BID   ??? [START ON 11/09/2021] phytonadione (vitamin K1)  5 mg Oral Once per day on Mon Thu   ??? piperacillin-tazobactam (ZOSYN) IV (intermittent)  4.5 g Intravenous Q6H   ??? pramipexole  0.25 mg Oral Nightly   ??? rivaroxaban  20 mg Oral Daily   ??? sodium chloride 7%  4 mL Nebulization 4x Daily (RT)   ??? traZODone  150 mg Oral Nightly     Continuous Infusions:    Lab Results   Component Value Date    WBC 7.8 11/04/2021    HGB 14.2 11/04/2021    HCT 41.8 11/04/2021    PLT 230 11/04/2021     Lab Results   Component Value Date    BILITOT 0.6 11/05/2021    BILIDIR 0.20 08/02/2021    PROT 6.3 11/05/2021    ALBUMIN 3.4 11/05/2021    ALT 75 (H) 11/05/2021    AST 51 (H) 11/05/2021    ALKPHOS 108 11/05/2021       Lab Results   Component Value Date    PT 24.0 (H) 11/04/2021    INR 2.06 11/04/2021    APTT 31.6 11/03/2021     Recent Labs     11/05/21  0808   NA 136   K 3.9   CL 98   CO2 29.0   BUN 11   CREATININE 0.87   GLU 175   CALCIUM 8.8   PROT 6.3   BILITOT 0.6   AST 51*   ALT 75*   ALKPHOS 108   Pending labs:   Pending Labs     Order Current Status    AFB culture In process    IgE Total In process    IgE Total In process    CF Sputum/ CF Sinus Culture Preliminary result        Laboratory studies since admission reviewed.    25 minutes, over 50% spent in counseling and coordination of care.  Tawni Levy MD

## 2021-11-08 NOTE — Unmapped (Signed)
Pt remains on IV abx. PRN pain meds given for rib pain x 1. No other complaints or concerns. Will cont to monitor.    Problem: Adult Inpatient Plan of Care  Goal: Plan of Care Review  Outcome: Progressing  Goal: Patient-Specific Goal (Individualized)  Outcome: Progressing  Goal: Absence of Hospital-Acquired Illness or Injury  Outcome: Progressing  Intervention: Identify and Manage Fall Risk  Recent Flowsheet Documentation  Taken 11/07/2021 2000 by Renette Butters, RN ADN  Safety Interventions:   fall reduction program maintained   low bed  Intervention: Prevent and Manage VTE (Venous Thromboembolism) Risk  Recent Flowsheet Documentation  Taken 11/07/2021 2000 by Renette Butters, RN ADN  Activity Management: activity adjusted per tolerance  Goal: Optimal Comfort and Wellbeing  Outcome: Progressing  Goal: Readiness for Transition of Care  Outcome: Progressing  Goal: Rounds/Family Conference  Outcome: Progressing     Problem: Infection  Goal: Absence of Infection Signs and Symptoms  Outcome: Progressing  Intervention: Prevent or Manage Infection  Recent Flowsheet Documentation  Taken 11/07/2021 2000 by Renette Butters, RN ADN  Isolation Precautions: contact precautions maintained     Problem: Airway Clearance Ineffective  Goal: Effective Airway Clearance  Outcome: Progressing  Intervention: Promote Airway Secretion Clearance  Recent Flowsheet Documentation  Taken 11/07/2021 2000 by Renette Butters, RN ADN  Activity Management: activity adjusted per tolerance     Problem: Diabetes Comorbidity  Goal: Blood Glucose Level Within Targeted Range  Outcome: Progressing

## 2021-11-08 NOTE — Unmapped (Addendum)
Cystic Fibrosis Nutrition Assessment    Inpatient, In-person: Nutrition Risk Screening this admission for CF and related follow up  Primary Pulmonary Provider: Dr [name]  ===================================================================  Christian Young is a 36 y.o. male w/ PMH significant for cystic fibrosis, pancreatic insufficiency, diabetes who presented on 3/24 for CF exacerbation.     [name] reports good appetite/intake. States he prefers to continue with Regular diet (vs. High Calorie High Protein) as to not over order at meal time. Also states he has been trying to stay below 75 grams carbohydrate per meal. Denies additional questions/concerns re: diet and blood glucose management. Denies s/s of malabsorption, nausea, vomiting, constipation, diarrhea.   ===================================================================  INTERVENTION:    1. Offered support re: meal ordering and nutrition approaches to blood glucose management. Continue Regular diet per patient preference.   2. [name] request 1 afternoon snack, RD to enter into [employer]. Encouraged [name] to monitor blood glucose levels with requested snack.  3. Recommend decrease to 1 MVW Complete Formulation gelcap BID.   4. PT elevated this admit. Vitamin K started, 5mg  twice weekly  5. Continue remainder of nutrition regimen:  - bowel regimen as ordered  - insulin regimen per endocrinology  - acid reducer  - enzyme regimen: Creon 24,000 x 12 caps/meal, 6 caps/snack (d/c with order for Creon 36,000s per home regimen)    Inpatient:   Will follow up with patient per protocol: 1-2 times per week (and more frequent as indicated)  Monitor for potential discharge needs with multi-disciplinary team.   ===================================================================  ASSESSMENT:  Nutrition Category = Adult Obesity Class 1: BMI 30 to < 35 kg/m2    Estimated daily needs: 3284 kcal/day, 136-181 g PRO/day, 3368 mL fluid/day      Calories estimated using: Cystic Fibrosis Conference Formula, protein per DRI x 1.5-2, fluid per [facility]    Current PO intake is adequate to meet estimated CF needs. Patient continues to work towards goals for weight management.   Enzyme dose is within established guidelines. Vitamin prescription is not appropriate to reach/maintain optimal fat soluble vitamin levels. Patient would benefit from change in vitamin regimen. Patient to benefit from adjustment in insulin regimen related to carhohydrate intake. Patient on CFTR modulator is consuming adequate amounts of fat-containing foods with prescribed medication to optimize absorption. Current diet adequate to meet patient preferences in working towards blood glucose management and considering nutritional needs with CF.    ASPEN/AND Malnutrition Screening:  Patient does not meet ASPEN/AND criteria for malnutrition at this time.    Goals:  1. Ongoing:  Meet estimated daily needs  2. Ongoing:  Reach/maintain established anthropometric goals for Adult CF: BMI < 30 kg/m2   3. Ongoing:  Normal fat-soluble vitamin levels: Vitamin A, Vitamin E and PT per lab range; Vitamin D 25OH total >30   4. Ongoing:  Maintain glucose control. Carbohydrate content of diet should comprise 40-50% of total calorie needs, but carbohydrates are not restricted in this population.    5. Ongoing:  Meet sodium needs for CF     Nutrition goals reviewed, and relevant barriers identified and addressed: none evident.   Patient is evaluated to have good  willingness and ability to achieve nutrition goals.   ===================================================================  INPATIENT:     Current Nutrition Orders (inpatient):  Oral intake        Nutrition Orders   (From admission, onward)                 Start  Ordered    11/04/21 0936  Nutrition Therapy Regular/House  Effective now        Question:  Nutrition Therapy:  Answer:  Regular/House    11/04/21 0936                  CF Nutrition related medications (inpatient): Nutritionally relevant medications reviewed.   lipitor  trikafta  Insulin regimen per Endo (lantus, humaLOG)  Creon 24,000 (12 caps/meal, 6 caps/snack - provides 2666 units lipase/kg/meal & 16109 units lipase/kg/day)  protonix  2 MVW complete formulation gel cap BID  IV abx  PRN zofran  PRN miralax    CF Nutrition related labs (inpatient): reviewed; PT elevated during this admit; (11-02-2021) Hgb A1C 10.6%   Recent Labs   Lab Units 11/08/21  1134 11/08/21  0752 11/07/21  2035 11/07/21  1718   POC GLUCOSE mg/dL 604* 540* 981* 191*     ==================================================================  CLINICAL DATA:  Past Medical History:   Diagnosis Date   ??? Anxiety    ??? Chronic pain disorder    ??? Cystic fibrosis (CMS-HCC)    ??? Depression    ??? Hypertension    ??? Lumbar radiculopathy 10/26/2020   ??? Nonproductive cough 04/05/2018     Patient Active Problem List   Diagnosis   ??? Essential hypertension   ??? Depressive disorder   ??? Mood disorder (CMS-HCC)   ??? Anxiety   ??? 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 Chronic pansinusitis   ??? Pancreatic insufficiency due to cystic fibrosis (CMS-HCC)   ??? History of Mycobacterium abscessus infection   ??? Bronchiectasis (CMS-HCC)   ??? Chronic deep vein thrombosis (DVT) of lower extremity (CMS-HCC)   ??? Cystic fibrosis with pulmonary exacerbation (CMS-HCC)   ??? Cystic fibrosis exacerbation (CMS-HCC)   ??? Obesity (BMI 30-39.9)   ??? Restless leg syndrome   ??? Degeneration of lumbar intervertebral disc   ??? Lumbar radiculopathy   ??? Idiopathic peripheral neuropathy   ??? Long term current use of anticoagulant therapy   ??? Acute non-recurrent pansinusitis   ??? AKI (acute kidney injury) (CMS-HCC)   ??? Hemoptysis     Anthroprometric Evaluation:  Weight changes: 2.4% wt loss x ~2 months  CFTR modulator and weight change: On Trikafta    BMI Readings from Last 3 Encounters:   11/03/21 32.34 kg/m??   09/09/21 33.13 kg/m??   07/30/21 33.23 kg/m??     Wt Readings from Last 3 Encounters:   11/03/21 (!) 108.2 kg (238 lb 8.6 oz)   09/09/21 (!) 110.8 kg (244 lb 4.3 oz)   07/30/21 (!) 111.1 kg (245 lb)     Ht Readings from Last 3 Encounters:   11/03/21 182.9 cm (6' 0.01)   09/08/21 182.9 cm (6')   07/29/21 182.9 cm (6')   ==================================================================  Energy Intake (outpatient):  Diet: [name] reports good appetite PTA.    Allergies, Intolerances, Sensitivities, and/or Cultural/Religious Dietary Restrictions:   As below per chart review  Allergies   Allergen Reactions   ??? Aztreonam Anaphylaxis, Hives, Nausea And Vomiting and Rash     fevers  Patient stated that he only vomited x1 with [place] in the past.      fevers  Patient stated that he only vomited x1 with [place] in the past.     ??? [place] [Aztreonam Lysine] Anaphylaxis   ??? Cefepime Itching, Nausea Only and Other (See Comments)     Headaches also   ??? Other Anaphylaxis and Other (See Comments)  Other reaction(s): Other (See Comments)  Bananas: itchy throat  Slo Bid record from [employer] states anaphylaxis.  Pt states this was from childhood and does not know reaction.  Bananas, causes itchy throat   ??? Slo-Bid 100 Anaphylaxis   ??? Tobramycin Tinnitus     From OSH record-documented as tinnitus but has received IV tobra with close monitoring.  Pt has received Tobramycin at [facility] since this allergy documented    From OSH record-documented as tinnitus but has received IV tobra with close monitoring.   ??? Banana Itching and Nausea And Vomiting     Sodium in diet:  historically adequate  Calcium in diet:   historically adequate  Food Insecurity:   Food Insecurity: No Food Insecurity   ??? Worried About Programme researcher, broadcasting/film/video in the Last Year: Never true   ??? Ran Out of Food in the Last Year: Never true   CFTR modulator and Diet: Prescribed Trikafta (elexacaftor/tezacaftor/ivacaftor).  PO Supplements: none  Patient resources for DME/formula:  -none  Appetite Stimulant: none  Enteral feeding tube: none  Physical activity: limited recently d/t back pain; not discussed today    GI/Malabsorption (outpatient):  Enzyme brand, (meals/snacks):  Creon 36,000 @ 8/meal and 4/snack  Enzyme administration details: correct pre-meal administration., good compliance at all meals and snacks  Enzyme dose per MEAL (units lipase/kg/meal) 2666  Enzyme dose per DAY (units lipase/kg/day) 12000  GI meds:  Nutritionally relevant medications reviewed. Miralax, colace, probiotic, prilosec  Stools: daily; denies s/s of malabsorption  Fecal Fat Studies: pancreatic insufficient  No results found for: [id number]  Lab Results   Component Value Date    ELAST <15 (L) 03/19/2017     No results found for: PELAI    Vitamins/Minerals (outpatient):  CF-specific MVI, dose, compliance: MVW Complete Formulation Softgel regular 2 daily, good compliance reported  Other vitamins/minerals/herbals: none  Patient Resources for vitamins: [employer]  phone [phone]. Active through September 2023.  Calcium supplement: none  Fat-soluble vitamin levels: within normal limits June 2022 with exception of PT. PT historically elevated with normal DCP. Would likely benefit from recheck of DCP.  Lab Results   Component Value Date    VITAMINA 58.7 02/06/2021    VITAMINA 42.4 01/18/2020    VITAMINA 44.4 11/19/2016     Lab Results   Component Value Date    CRP 5.0 07/30/2021    CRP 10.0 06/15/2021    CRP 45.0 (H) 06/08/2021     Lab Results   Component Value Date    VITDTOTAL 38.2 02/06/2021    VITDTOTAL 27.9 03/19/2017     Lab Results   Component Value Date    VITAME 16.0 02/06/2021     Lab Results   Component Value Date    PT 24.0 (H) 11/04/2021    PT 11.7 11/03/2021    PT 23.3 (H) 07/30/2021     Lab Results   Component Value Date    DESGCARBPT 0.2 03/20/2017     No results found for: PIVKAII    Bone Health: Normal vitamin D level. Adequate calcium intake from diet. Normal bone density on DEXA 02/2017.    CF Related Diabetes: yes; on insulin with insulin pump. Followed by endocrinology. Seen by DM educator during March 2023 admit.  Lab Results   Component Value Date    GLUF 178 (H) 01/18/2020     No results found for: GLUCOSE2HR  Lab Results   Component Value Date    A1C 10.6 (H) 11/03/2021  A1C 9.0 (H) 06/08/2021    A1C 10.0 (H) 03/09/2021

## 2021-11-09 LAB — CBC
HEMATOCRIT: 38.9 % — ABNORMAL LOW (ref 39.0–48.0)
HEMOGLOBIN: 12.9 g/dL (ref 12.9–16.5)
MEAN CORPUSCULAR HEMOGLOBIN CONC: 33.1 g/dL (ref 32.0–36.0)
MEAN CORPUSCULAR HEMOGLOBIN: 26.6 pg (ref 25.9–32.4)
MEAN CORPUSCULAR VOLUME: 80.3 fL (ref 77.6–95.7)
MEAN PLATELET VOLUME: 7.8 fL (ref 6.8–10.7)
PLATELET COUNT: 262 10*9/L (ref 150–450)
RED BLOOD CELL COUNT: 4.84 10*12/L (ref 4.26–5.60)
RED CELL DISTRIBUTION WIDTH: 16.5 % — ABNORMAL HIGH (ref 12.2–15.2)
WBC ADJUSTED: 6.7 10*9/L (ref 3.6–11.2)

## 2021-11-09 LAB — COMPREHENSIVE METABOLIC PANEL
ALBUMIN: 3.8 g/dL (ref 3.4–5.0)
ALKALINE PHOSPHATASE: 90 U/L (ref 46–116)
ALT (SGPT): 58 U/L — ABNORMAL HIGH (ref 10–49)
ANION GAP: 7 mmol/L (ref 5–14)
AST (SGOT): 45 U/L — ABNORMAL HIGH (ref <=34)
BILIRUBIN TOTAL: 0.4 mg/dL (ref 0.3–1.2)
BLOOD UREA NITROGEN: 10 mg/dL (ref 9–23)
BUN / CREAT RATIO: 13
CALCIUM: 9.5 mg/dL (ref 8.7–10.4)
CHLORIDE: 101 mmol/L (ref 98–107)
CO2: 29.8 mmol/L (ref 20.0–31.0)
CREATININE: 0.75 mg/dL
EGFR CKD-EPI (2021) MALE: >90 mL/min/{1.73_m2} (ref >=60)
GLUCOSE RANDOM: 178 mg/dL (ref 70–179)
POTASSIUM: 4.3 mmol/L (ref 3.4–4.8)
PROTEIN TOTAL: 7.2 g/dL (ref 5.7–8.2)
SODIUM: 138 mmol/L (ref 135–145)

## 2021-11-09 LAB — PROTIME-INR
INR: 2.03
PROTIME: 23.6 s — ABNORMAL HIGH (ref 9.8–12.8)

## 2021-11-09 LAB — C-REACTIVE PROTEIN: C-REACTIVE PROTEIN: <4 mg/L (ref <=10.0)

## 2021-11-09 LAB — SEDIMENTATION RATE: ERYTHROCYTE SEDIMENTATION RATE: 17 mm/h — ABNORMAL HIGH (ref 0–15)

## 2021-11-09 MED ADMIN — pramipexole (MIRAPEX) tablet 0.25 mg: .25 mg | ORAL | @ 01:00:00

## 2021-11-09 MED ADMIN — piperacillin-tazobactam (ZOSYN) IVPB (premix) 4.5 g: 4.5 g | INTRAVENOUS | @ 21:00:00 | Stop: 2021-11-09

## 2021-11-09 MED ADMIN — colistimethate (COLYMYCIN) 150 mg in sodium chloride (NS) 4 mL inhalation: 150 mg | RESPIRATORY_TRACT | @ 13:00:00

## 2021-11-09 MED ADMIN — insulin lispro (HumaLOG) injection 30 Units: 30 [IU] | SUBCUTANEOUS | @ 14:00:00

## 2021-11-09 MED ADMIN — pantoprazole (PROTONIX) EC tablet 40 mg: 40 mg | ORAL | @ 14:00:00

## 2021-11-09 MED ADMIN — gabapentin (NEURONTIN) capsule 600 mg: 600 mg | ORAL | @ 01:00:00

## 2021-11-09 MED ADMIN — oxyCODONE (ROXICODONE) immediate release tablet 5 mg: 5 mg | ORAL | @ 01:00:00 | Stop: 2021-11-17

## 2021-11-09 MED ADMIN — phytonadione (vitamin K1) (MEPHYTON) tablet 5 mg: 5 mg | ORAL | @ 14:00:00 | Stop: 2021-11-09

## 2021-11-09 MED ADMIN — insulin lispro (HumaLOG) injection 30 Units: 30 [IU] | SUBCUTANEOUS | @ 22:00:00

## 2021-11-09 MED ADMIN — pancrelipase (Lip-Prot-Amyl) (CREON) 24,000-76,000 -120,000 unit delayed release capsule 288,000 units of lipase: 288000 [IU] | ORAL | @ 16:00:00

## 2021-11-09 MED ADMIN — sodium chloride 7% nebulizer solution 4 mL: 4 mL | RESPIRATORY_TRACT | @ 01:00:00

## 2021-11-09 MED ADMIN — sodium chloride 7% nebulizer solution 4 mL: 4 mL | RESPIRATORY_TRACT | @ 21:00:00

## 2021-11-09 MED ADMIN — oxyCODONE (ROXICODONE) immediate release tablet 5 mg: 5 mg | ORAL | @ 19:00:00 | Stop: 2021-11-17

## 2021-11-09 MED ADMIN — elexacaftor-tezacaftor-ivacaft (TRIKAFTA) tablet 2 tablet  **Patient Supplied**: 2 | ORAL | @ 14:00:00

## 2021-11-09 MED ADMIN — acetaminophen (TYLENOL) tablet 650 mg: 650 mg | ORAL | @ 03:00:00

## 2021-11-09 MED ADMIN — acetaminophen (TYLENOL) tablet 650 mg: 650 mg | ORAL | @ 21:00:00

## 2021-11-09 MED ADMIN — insulin lispro (HumaLOG) injection 30 Units: 30 [IU] | SUBCUTANEOUS | @ 16:00:00

## 2021-11-09 MED ADMIN — amitriptyline (ELAVIL) tablet 100 mg: 100 mg | ORAL | @ 01:00:00

## 2021-11-09 MED ADMIN — fluticasone furoate-vilanteroL (BREO ELLIPTA) 200-25 mcg/dose inhaler 1 puff: 1 | RESPIRATORY_TRACT | @ 13:00:00

## 2021-11-09 MED ADMIN — albuterol 2.5 mg /3 mL (0.083 %) nebulizer solution 2.5 mg: 2.5 mg | RESPIRATORY_TRACT | @ 13:00:00

## 2021-11-09 MED ADMIN — piperacillin-tazobactam (ZOSYN) IVPB (premix) 4.5 g: 4.5 g | INTRAVENOUS | @ 08:00:00 | Stop: 2021-11-09

## 2021-11-09 MED ADMIN — albuterol 2.5 mg /3 mL (0.083 %) nebulizer solution 2.5 mg: 2.5 mg | RESPIRATORY_TRACT | @ 17:00:00

## 2021-11-09 MED ADMIN — dornase alfa (PULMOZYME) 1 mg/mL solution 2.5 mg: 2.5 mg | RESPIRATORY_TRACT | @ 13:00:00

## 2021-11-09 MED ADMIN — piperacillin-tazobactam (ZOSYN) IVPB (premix) 4.5 g: 4.5 g | INTRAVENOUS | @ 03:00:00 | Stop: 2021-11-10

## 2021-11-09 MED ADMIN — elexacaftor-tezacaftor-ivacaft (TRIKAFTA) tablet 1 tablet **Patient Supplied**: 1 | ORAL | @ 01:00:00

## 2021-11-09 MED ADMIN — piperacillin-tazobactam (ZOSYN) IVPB (premix) 4.5 g: 4.5 g | INTRAVENOUS | @ 14:00:00 | Stop: 2021-11-09

## 2021-11-09 MED ADMIN — montelukast (SINGULAIR) tablet 10 mg: 10 mg | ORAL | @ 01:00:00

## 2021-11-09 MED ADMIN — lisinopriL (PRINIVIL,ZESTRIL) tablet 10 mg: 10 mg | ORAL | @ 14:00:00

## 2021-11-09 MED ADMIN — pediatric multivitamin-vit D3 1,500 unit-vit K 800 mcg (MVW COMPLETE FORMULATION) capsule: 2 | ORAL | @ 14:00:00 | Stop: 2021-11-09

## 2021-11-09 MED ADMIN — sodium chloride 7% nebulizer solution 4 mL: 4 mL | RESPIRATORY_TRACT | @ 13:00:00

## 2021-11-09 MED ADMIN — albuterol 2.5 mg /3 mL (0.083 %) nebulizer solution 2.5 mg: 2.5 mg | RESPIRATORY_TRACT | @ 21:00:00

## 2021-11-09 MED ADMIN — insulin lispro (HumaLOG) injection 0-20 Units: 0-20 [IU] | SUBCUTANEOUS | @ 01:00:00

## 2021-11-09 MED ADMIN — pediatric multivitamin-vit D3 1,500 unit-vit K 800 mcg (MVW COMPLETE FORMULATION) capsule: 2 | ORAL | @ 01:00:00

## 2021-11-09 MED ADMIN — gabapentin (NEURONTIN) capsule 600 mg: 600 mg | ORAL | @ 19:00:00

## 2021-11-09 MED ADMIN — rivaroxaban (XARELTO) tablet 20 mg: 20 mg | ORAL | @ 14:00:00

## 2021-11-09 MED ADMIN — pancrelipase (Lip-Prot-Amyl) (CREON) 24,000-76,000 -120,000 unit delayed release capsule 288,000 units of lipase: 288000 [IU] | ORAL | @ 14:00:00

## 2021-11-09 MED ADMIN — atorvastatin (LIPITOR) tablet 40 mg: 40 mg | ORAL | @ 01:00:00

## 2021-11-09 MED ADMIN — lamoTRIgine (LaMICtal) tablet 400 mg: 400 mg | ORAL | @ 14:00:00

## 2021-11-09 MED ADMIN — fluticasone propionate (FLONASE) 50 mcg/actuation nasal spray 2 spray: 2 | NASAL | @ 14:00:00

## 2021-11-09 MED ADMIN — FLUoxetine (PROzac) capsule 60 mg: 60 mg | ORAL | @ 14:00:00

## 2021-11-09 MED ADMIN — cetirizine (ZyrTEC) tablet 10 mg: 10 mg | ORAL | @ 14:00:00

## 2021-11-09 MED ADMIN — traZODone (DESYREL) tablet 150 mg: 150 mg | ORAL | @ 01:00:00

## 2021-11-09 MED ADMIN — sodium chloride 7% nebulizer solution 4 mL: 4 mL | RESPIRATORY_TRACT | @ 17:00:00

## 2021-11-09 MED ADMIN — insulin glargine (LANTUS) injection 40 Units: 40 [IU] | SUBCUTANEOUS | @ 01:00:00

## 2021-11-09 MED ADMIN — levoFLOXacin (LEVAQUIN) tablet 750 mg: 750 mg | ORAL | @ 14:00:00 | Stop: 2021-11-19

## 2021-11-09 MED ADMIN — albuterol 2.5 mg /3 mL (0.083 %) nebulizer solution 2.5 mg: 2.5 mg | RESPIRATORY_TRACT | @ 01:00:00

## 2021-11-09 MED ADMIN — pantoprazole (PROTONIX) EC tablet 40 mg: 40 mg | ORAL | @ 01:00:00

## 2021-11-09 MED ADMIN — colistimethate (COLYMYCIN) 150 mg in sodium chloride (NS) 4 mL inhalation: 150 mg | RESPIRATORY_TRACT | @ 01:00:00

## 2021-11-09 MED ADMIN — gabapentin (NEURONTIN) capsule 600 mg: 600 mg | ORAL | @ 14:00:00

## 2021-11-09 MED ADMIN — oxyCODONE (ROXICODONE) immediate release tablet 5 mg: 5 mg | ORAL | @ 23:00:00 | Stop: 2021-11-17

## 2021-11-09 MED ADMIN — insulin lispro (HumaLOG) injection 0-20 Units: 0-20 [IU] | SUBCUTANEOUS | @ 16:00:00

## 2021-11-09 MED ADMIN — pancrelipase (Lip-Prot-Amyl) (CREON) 24,000-76,000 -120,000 unit delayed release capsule 288,000 units of lipase: 288000 [IU] | ORAL | @ 21:00:00

## 2021-11-09 NOTE — Unmapped (Signed)
Problem: Airway Clearance Ineffective  Goal: Effective Airway Clearance  Intervention: Promote Airway Secretion Clearance  Recent Flowsheet Documentation  Taken 11/08/2021 2050 by Verdell Face, RRT  Breathing Techniques/Airway Clearance: deep/controlled cough encouraged       Patient compliant with all neb tx & airway clearance therapies.  No adverse reactions. No respiratory distress noted

## 2021-11-09 NOTE — Unmapped (Signed)
[facility] Internal Medicine Hospitalist Progress Note     LOS: 6 days       Assessment/Plan:  Principal Problem:    Cystic fibrosis with pulmonary exacerbation (CMS-HCC)  Active Problems:    Essential hypertension    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)    Pancreatic insufficiency due to cystic fibrosis (CMS-HCC)    Chronic deep vein thrombosis (DVT) of lower extremity (CMS-HCC)    Long term current use of anticoagulant therapy  Resolved Problems:    * No resolved hospital problems. *         36 year old Caucasian male well-known to the hospital service for exacerbation of cystic fibrosis, admitted for IV antibiotics and managed of multiple comorbidities.    1.  CF exacerbation.  -Continue current antibiotic and airway management per Pulmonology recommendation with Zosyn being infused in his port without difficulty, p.o. levofloxacin 750 mg daily.  Medication list otherwise as shown below.  The patient will remain in the hospital for the duration of his treatment.  - Emphasized importance of taking Trikafta as outpatient.    2.  Type 2 diabetes mellitus.  See 3/20 note for additional details on home use of insulin pump.  Blood sugars are varying from 89-216, would not make any changes today based upon these numbers.  Continue with Lantus and Premeal insulin as well as sliding scale, ACHS Accu-Cheks.    3.  Essential hypertension.  Continue current regimen, he is normotensive.    4.  Pancreatic insufficiency, continue pancreatic enzyme therapy.    5.  History of DVT, continue anticoagulation chronically.    6.  Central chest pain from coughing.  -He typically requestss oxycodone when hospitalized.  He is complaining of some inflammatory pain from CF exacerbation and cough.  Inflammatory markers with CRP are normal.  ESR pending.  -Continue 5 mg p.o. every 6 hours as needed, would not increase dose unless there is objective reason or good clinical indication otherwise.    7.  Nonspecific mild elevation in transaminases, continue to monitor with routine labs.  No specific therapy or change in medication regimen is indicated.      *DVT prophylaxis: He is fully ambulatory.    *Disposition: He will require over 2 midnights in the hospital.      Please page the [facility]) pager at [phone] with questions.      Consultants:   1.  Pulmonology      Subjective:   Playing video games, in good spirits.  No complaint of fever, chills.  Appetite is good.    Objective:       Vital signs in last 24 hours:  Temp:  [36.3 ??C (97.3 ??F)] 36.3 ??C (97.3 ??F)  Heart Rate:  [74-94] 85  Resp:  [18] 18  BP: (115-137)/(71-85) 115/71  MAP (mmHg):  [89-106] 89  SpO2:  [95 %-97 %] 95 %    Intake/Output last 24 hours:  No intake or output data in the 24 hours ending 11/09/21 1554      Physical Exam:    Gen: Alert, NAD    HEENT: No icterus.    CV: Regular rate and rhythm.    PULM/chest: Good air exchange, no wheezing.  Few crackles in right base, may be a baseline finding.    FAO:ZHYQ, Non tender, No palpable organomegaly    Ext: No C/C/E    Neuro: No acute focal deficits.    Skin: Warm, dry.      Medications:  Scheduled Meds:  ??? albuterol  2.5 mg Nebulization 4x Daily (RT)   ??? amitriptyline  100 mg Oral Nightly   ??? atorvastatin  40 mg Oral Nightly   ??? cetirizine  10 mg Oral Daily   ??? colistimetate (COLISTIN) inhalation  150 mg Inhalation BID (RT)   ??? dornase alfa  2.5 mg Inhalation Daily (RT)   ??? elexacaftor-tezacaftor-ivacaft  2 tablet Oral daily    And   ??? elexacaftor-tezacaftor-ivacaft  1 tablet Oral Q PM   ??? FLUoxetine  60 mg Oral Daily   ??? fluticasone furoate-vilanteroL  1 puff Inhalation Daily (RT)   ??? fluticasone propionate  2 spray Each Nare Daily   ??? gabapentin  600 mg Oral TID   ??? heparin, porcine (PF)  300 Units Intravenous Q MWF   ??? insulin glargine  40 Units Subcutaneous Nightly   ??? insulin lispro  0-20 Units Subcutaneous ACHS   ??? insulin lispro  30 Units Subcutaneous 3xd Meals   ??? lamoTRIgine  400 mg Oral Daily   ??? levoFLOXacin  750 mg Oral Q24H SCH   ??? lisinopriL  10 mg Oral Daily   ??? montelukast  10 mg Oral Nightly   ??? pancrelipase (Lip-Prot-Amyl)  288,000 units of lipase Oral 3xd Meals   ??? pantoprazole  40 mg Oral BID   ??? [START ON 11/10/2021] MVW Complete (pediatric multivit 61-D3-vit K)  2 capsule Oral Daily   ??? phytonadione (vitamin K1)  5 mg Oral Once per day on Mon Thu   ??? piperacillin-tazobactam (ZOSYN) IV (intermittent)  4.5 g Intravenous Q6H   ??? pramipexole  0.25 mg Oral Nightly   ??? rivaroxaban  20 mg Oral Daily   ??? sodium chloride 7%  4 mL Nebulization 4x Daily (RT)   ??? traZODone  150 mg Oral Nightly     Continuous Infusions:    Lab Results   Component Value Date    WBC 6.7 11/09/2021    HGB 12.9 11/09/2021    HCT 38.9 (L) 11/09/2021    PLT 262 11/09/2021     Lab Results   Component Value Date    BILITOT 0.4 11/09/2021    BILIDIR 0.20 08/02/2021    PROT 7.2 11/09/2021    ALBUMIN 3.8 11/09/2021    ALT 58 (H) 11/09/2021    AST 45 (H) 11/09/2021    ALKPHOS 90 11/09/2021       Lab Results   Component Value Date    PT 23.6 (H) 11/09/2021    INR 2.03 11/09/2021    APTT 31.6 11/03/2021     Recent Labs     11/09/21  1246   NA 138   K 4.3   CL 101   CO2 29.8   BUN 10   CREATININE 0.75   GLU 178   CALCIUM 9.5   PROT 7.2   BILITOT 0.4   AST 45*   ALT 58*   ALKPHOS 90   CRP <4.0     Recent Labs     11/09/21  1246   INR 2.03     No results for input(s): OCCULTBLD, RAPSCRN, CDIFRPCR, CDIFFNAP1, A1C in the last 72 hours.  Pending labs:   Pending Labs     Order Current Status    AFB culture In process        Laboratory studies since admission reviewed.    25 minutes, over 50% spent in counseling and coordination of care.  Tawni Levy MD

## 2021-11-09 NOTE — Unmapped (Signed)
Patient alert and oriented x4.  VSS.  Ambulates independently.  Continent of bowel and bladder.  Fingerstick blood glucose monitoring with sliding scale and scheduled coverage.  Call light in reach.  Problem: Adult Inpatient Plan of Care  Goal: Plan of Care Review  Outcome: Progressing  Goal: Patient-Specific Goal (Individualized)  Outcome: Progressing  Goal: Absence of Hospital-Acquired Illness or Injury  Outcome: Progressing  Intervention: Identify and Manage Fall Risk  Recent Flowsheet Documentation  Taken 11/09/2021 1400 by Jarrett Soho, RN  Safety Interventions: fall reduction program maintained  Taken 11/09/2021 1200 by Jarrett Soho, RN  Safety Interventions: fall reduction program maintained  Taken 11/09/2021 1000 by Jarrett Soho, RN  Safety Interventions: fall reduction program maintained  Taken 11/09/2021 0800 by Jarrett Soho, RN  Safety Interventions: fall reduction program maintained  Goal: Optimal Comfort and Wellbeing  Outcome: Progressing  Goal: Readiness for Transition of Care  Outcome: Progressing  Goal: Rounds/Family Conference  Outcome: Progressing     Problem: Infection  Goal: Absence of Infection Signs and Symptoms  Outcome: Progressing  Intervention: Prevent or Manage Infection  Recent Flowsheet Documentation  Taken 11/09/2021 1400 by Jarrett Soho, RN  Isolation Precautions: contact precautions maintained  Taken 11/09/2021 1200 by Jarrett Soho, RN  Isolation Precautions: contact precautions maintained  Taken 11/09/2021 1000 by Jarrett Soho, RN  Isolation Precautions: contact precautions maintained     Problem: Airway Clearance Ineffective  Goal: Effective Airway Clearance  Outcome: Progressing     Problem: Diabetes Comorbidity  Goal: Blood Glucose Level Within Targeted Range  Outcome: Progressing

## 2021-11-09 NOTE — Unmapped (Signed)
Pt alert and oriented x4. Vss. No acute signs of distress noted. Pain controlled with prn oxycodone given per orders. IV abx infused without difficulty. Port flushes and has adequate blood return. Pt remains free from falls. Call bell within reach.       Problem: Adult Inpatient Plan of Care  Goal: Plan of Care Review  Outcome: Ongoing - Unchanged  Goal: Patient-Specific Goal (Individualized)  Outcome: Ongoing - Unchanged  Goal: Absence of Hospital-Acquired Illness or Injury  Outcome: Ongoing - Unchanged  Intervention: Identify and Manage Fall Risk  Recent Flowsheet Documentation  Taken 11/08/2021 2000 by Rebeca Allegra, RN  Safety Interventions:   fall reduction program maintained   low bed   nonskid shoes/slippers when out of bed  Intervention: Prevent and Manage VTE (Venous Thromboembolism) Risk  Recent Flowsheet Documentation  Taken 11/08/2021 2000 by Rebeca Allegra, RN  Activity Management: up ad lib  Goal: Optimal Comfort and Wellbeing  Outcome: Ongoing - Unchanged  Goal: Readiness for Transition of Care  Outcome: Ongoing - Unchanged  Goal: Rounds/Family Conference  Outcome: Ongoing - Unchanged

## 2021-11-09 NOTE — Unmapped (Addendum)
[facility] Internal Medicine Hospitalist Progress Note     LOS: 5 days       Assessment/Plan:  Principal Problem:    Cystic fibrosis with pulmonary exacerbation (CMS-HCC)  Active Problems:    Essential hypertension    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)    Pancreatic insufficiency due to cystic fibrosis (CMS-HCC)    Chronic deep vein thrombosis (DVT) of lower extremity (CMS-HCC)    Long term current use of anticoagulant therapy  Resolved Problems:    * No resolved hospital problems. *         36 year old Caucasian male well-known to the hospital service for exacerbation of cystic fibrosis, admitted for IV antibiotics and managed of multiple comorbidities.    1.  CF exacerbation.  -Continue current antibiotic and airway management per Pulmonology recommendation  Medication list shown below.  The patient will remain in the hospital for the duration of his treatment.    2.  Type 2 diabetes mellitus.  See 3/20 note for additional details on home use of insulin pump.  Continue with Lantus and Premeal insulin as well as sliding scale, ACHS Accu-Cheks.    3.  Essential hypertension.  Continue current regimen    4.  Pancreatic insufficiency, continue pancreatic enzyme therapy.    5.  History of DVT, continue anticoagulation chronically.    6.  Chest pain with cough.  -He typically requestss oxycodone when hospitalized.  He is complaining of some inflammatory pain from CF exacerbation and cough.  -Continue 5 mg p.o. every 6 hours as needed, do not increase dose unless there is objective reason or good clinical indication otherwise.      *DVT prophylaxis: He is fully ambulatory.    *Disposition: He will require over 2 midnights in the hospital.      Please page the [facility]) pager at [phone] with questions.      Consultants:   1.  Pulmonology      Subjective:   No fever, chills.  Breathing continues to improve.  Appetite is good.  He denies hemoptysis.    Objective:       Vital signs in last 24 hours:  Temp:  [35.9 ??C (96.6 ??F)-36.8 ??C (98.2 ??F)] 36.5 ??C (97.7 ??F)  Heart Rate:  [82-100] 99  Resp:  [18] 18  BP: (118-130)/(63-77) 121/74  MAP (mmHg):  [85-92] 92  SpO2:  [96 %-100 %] 100 %    Intake/Output last 24 hours:  No intake or output data in the 24 hours ending 11/08/21 1739      Physical Exam:    Gen: Alert, NAD    HEENT: No icterus.    CV: Regular rate and rhythm.    PULM/chest: Good air exchange, no wheezing.  Few crackles in right base.    YQM:VHQI, Non tender, No palpable organomegaly    Ext: No C/C/E    Neuro: No acute focal deficits.    Skin: Warm, dry.      Medications:   Scheduled Meds:  ??? albuterol  2.5 mg Nebulization 4x Daily (RT)   ??? amitriptyline  100 mg Oral Nightly   ??? atorvastatin  40 mg Oral Nightly   ??? cetirizine  10 mg Oral Daily   ??? colistimetate (COLISTIN) inhalation  150 mg Inhalation BID (RT)   ??? dornase alfa  2.5 mg Inhalation Daily (RT)   ??? elexacaftor-tezacaftor-ivacaft  2 tablet Oral daily    And   ??? elexacaftor-tezacaftor-ivacaft  1 tablet Oral  Q PM   ??? FLUoxetine  60 mg Oral Daily   ??? fluticasone furoate-vilanteroL  1 puff Inhalation Daily (RT)   ??? fluticasone propionate  2 spray Each Nare Daily   ??? gabapentin  600 mg Oral TID   ??? heparin, porcine (PF)  300 Units Intravenous Q MWF   ??? insulin glargine  40 Units Subcutaneous Nightly   ??? insulin lispro  0-20 Units Subcutaneous ACHS   ??? insulin lispro  20 Units Subcutaneous Once   ??? insulin lispro  30 Units Subcutaneous 3xd Meals   ??? lamoTRIgine  400 mg Oral Daily   ??? levoFLOXacin  750 mg Oral Q24H SCH   ??? lisinopriL  10 mg Oral Daily   ??? montelukast  10 mg Oral Nightly   ??? pancrelipase (Lip-Prot-Amyl)  288,000 units of lipase Oral 3xd Meals   ??? pantoprazole  40 mg Oral BID   ??? MVW Complete (pediatric multivit 61-D3-vit K)  2 capsule Oral BID   ??? [START ON 11/09/2021] phytonadione (vitamin K1)  5 mg Oral Once per day on Mon Thu   ??? piperacillin-tazobactam (ZOSYN) IV (intermittent)  4.5 g Intravenous Q6H   ??? pramipexole  0.25 mg Oral Nightly   ??? rivaroxaban  20 mg Oral Daily   ??? sodium chloride 7%  4 mL Nebulization 4x Daily (RT)   ??? traZODone  150 mg Oral Nightly     Continuous Infusions:    Lab Results   Component Value Date    WBC 7.8 11/04/2021    HGB 14.2 11/04/2021    HCT 41.8 11/04/2021    PLT 230 11/04/2021     Lab Results   Component Value Date    BILITOT 0.6 11/05/2021    BILIDIR 0.20 08/02/2021    PROT 6.3 11/05/2021    ALBUMIN 3.4 11/05/2021    ALT 75 (H) 11/05/2021    AST 51 (H) 11/05/2021    ALKPHOS 108 11/05/2021       Lab Results   Component Value Date    PT 24.0 (H) 11/04/2021    INR 2.06 11/04/2021    APTT 31.6 11/03/2021     No results for input(s): NA, K, CL, CO2, BUN, CREATININE, GLU, CALCIUM, PROT, BILITOT, AST, ALT, ALKPHOS, MG, PHOS, LIPASE, TSH, LACTATE, AMMONIA, ESR, CRP in the last 72 hours.    Invalid input(s):  ALBUMIN  No results for input(s): CKTOTAL, CKMB, PCTCKMB, TROPONINI, EDTPNI, BNP, INR, LABPROT, APTT, DDIMER in the last 72 hours.  No results for input(s): OCCULTBLD, RAPSCRN, CDIFRPCR, CDIFFNAP1, A1C in the last 72 hours.  Pending labs:   Pending Labs     Order Current Status    AFB culture In process    CF Sputum/ CF Sinus Culture Preliminary result        Laboratory studies since admission reviewed.    25 minutes, over 50% spent in counseling and coordination of care.  Tawni Levy MD

## 2021-11-09 NOTE — Unmapped (Signed)
PULMONARY CONSULT  NOTE      Patient: Christian Young(08-17-1985)  Reason for consultation: Christian Young is a 36 y.o. male who is seen in consultation at the request of Rica Koyanagi, MD for comprehensive evaluation of Cystic Fibrosis with acute exacerbation.    Assessment and Recommendations:      Principal Problem:    Cystic fibrosis with pulmonary exacerbation (CMS-HCC)  Active Problems:    Essential hypertension    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)    Pancreatic insufficiency due to cystic fibrosis (CMS-HCC)    Chronic deep vein thrombosis (DVT) of lower extremity (CMS-HCC)    Long term current use of anticoagulant therapy  Resolved Problems:    * No resolved hospital problems. Christian Young is a 36 y.o. year old male with a history of cystic fibrosis (genetics [phone] and [phone] insertion) on Trikafta presenting for acute exacerbation of bronchiectasis.     Cystic Fibrosis with pulmonary manifestation with  acute exacerbation of bronchiectasis: Presentation consistent with a CF pulmonary exacerbation as characterized by cough, fatigue, increased SOB and increased sputum production.   - Piperacillin-tazobactam 4.5g IV q6hrs and levofloxacin 750mg  PO daily.  - Also continuing inhaled colistin as new suppressive regimen.  - Airway clearance with QID vest, albuterol, 7% HTS, Pulmozyme.  Encouraged him to use his percussive vest more frequently while hospitalized and to notify RT if the use so that is documented in his chart.  - Continue Breo daily.  - Continue Trikafta from patient home supply.  - PFTs done yesterday, at baseline, I do not see need to repeat these later in the course.  - Vitamin K 5 mg twice weekly while on IV antibiotics (ordered).   - Twice weekly labs sufficient on this current antibiotics course. Please obtain CBC-D, CMP, CRP and ESR tomorrow.   - For access, patient has Implanted Port, now being used, no issues thus far.  - As noted previously, patient does not want to return home for duration of his treatment course, wishes to complete inpatient (on or around 4/7 if 2 weeks is the intended plan).    Cystic Fibrosis with GI manifestations with  CF-related intestinal malabsorption (AOZ:HYQM mass index is 32.34 kg/m??.)  - High-calorie, high-protein diet with double portions.   - Monitor with weights twice weekly.  - Continue Creon and MVW vitamins during admission. Corrected vitamins to 2 tabs daily.  - due to check Vitamin A, D, E levels in 11/2021??- please obtain with labs at end of exacerbation course     Cystic Fibrosis with GI manifestations with  CF-related  diabetes  - monitor blood sugars frequently, glargine and lispro as ordered.    Left forearm pain at IV site:  - No evidence of thrombophlebitis.  Recommended warm compresses.    We appreciate the opportunity to assist in the care of this patient.  Please page [phone] with any questions.    Viona Gilmore, MD MPH    Subjective:      History of Present Illness:  Christian Young is a 36 y.o. male admitted for CF with acute exacerbation.    He presents with his usual symptoms of cough, increased sputum, and SOB. He has had several exacerbations similar to this and is well known to the pulmonology service (see note by Dr. [name]).    Interval history 11/09/21: Feeling reasonably well.  Coughing up small volume of yellow sputum that is improved from prior.  Chest muscles  feel sore which he attributes to coughing.  No hemoptysis or wheezing.  Sleeping well.  No GERD or diarrhea.  Reports that he had some swelling and tenderness of the left forearm IV site; IV has been removed.  Using [employer]arch vest twice a day.    Review of Systems: A comprehensive review of systems was performed and was negative except as above in HPI    Past medical, past surgical, family, and social histories reviewed from initial consult note and otherwise unchanged.    Medications reviewed in Epic.  Antibiotics with Zosyn and PO levofloxacin.    Allergies as of 11/03/2021 - Reviewed 11/03/2021   Allergen Reaction Noted   ??? Aztreonam Anaphylaxis, Hives, Nausea And Vomiting, and Rash 09/06/2015   ??? [place] [aztreonam lysine] Anaphylaxis 12/27/2016   ??? Cefepime Itching, Nausea Only, and Other (See Comments) 09/06/2015   ??? Other Anaphylaxis and Other (See Comments) 09/06/2015   ??? Slo-bid 100 Anaphylaxis 06/20/2017   ??? Tobramycin Tinnitus 09/06/2015   ??? Banana Itching and Nausea And Vomiting 07/29/2016        Objective:      Physical Exam:  Vitals:    11/08/21 2100 11/08/21 2120 11/09/21 0726 11/09/21 0925   BP:  137/85 115/71    Pulse: 89 94 74 85   Resp: 18 18 18 18    Temp:  36.3 ??C (97.3 ??F) 36.3 ??C (97.3 ??F)    TempSrc:  Oral Temporal    SpO2: 97% 97% 95% 95%   Weight:       Height:         GEN: NAD, sitting in chair  EYES: EOMI, sclera anicteric  ENT: Trachea midline, MMM  CV: RRR, no murmurs appreciated  PULM: Coarse breath sounds but no overt wheezing, rhonchi, rales.  Easy work of breathing without accessory muscle use.  ABD: soft, non-tender, non-distended  EXT: No edema.  Bruising and mild swelling over left forearm just distal to the elbow.  No palpable cord.  No erythema or increased warmth.  NEURO: Grossly Non-focal, moving all extremities normally  PSYCH: A+Ox3, appropriate         Diagnostic Review:   All labs and images were personally reviewed.    Sputum culture 3/25: 2+ mucoid PsA, 2+ smooth PsA, 2+ OP flora.    FVL 11/07/21: FEV1 3.26 (70%), FVC 4.28 (74%), 0.76  Overall similar to last study

## 2021-11-09 NOTE — Unmapped (Signed)
Problem: Airway Clearance Ineffective  Goal: Effective Airway Clearance  Intervention: Promote Airway Secretion Clearance  Recent Flowsheet Documentation  Taken 11/08/2021 2050 by Verdell Face, RRT  Breathing Techniques/Airway Clearance: deep/controlled cough encouraged       Patient compliant with all neb tx & airway clearance therapies. No adverse reactions noted. No respiratory distress noted

## 2021-11-09 NOTE — Unmapped (Signed)
Patient has done well throughout shift. Treatments given per schedule, patient self performs home chest vest. No acute events of note at this time.

## 2021-11-10 MED ADMIN — gabapentin (NEURONTIN) capsule 600 mg: 600 mg | ORAL | @ 01:00:00

## 2021-11-10 MED ADMIN — heparin, porcine (PF) 100 unit/mL injection 300 Units: 300 [IU] | INTRAVENOUS | @ 12:00:00

## 2021-11-10 MED ADMIN — insulin lispro (HumaLOG) injection 30 Units: 30 [IU] | SUBCUTANEOUS | @ 22:00:00

## 2021-11-10 MED ADMIN — lamoTRIgine (LaMICtal) tablet 400 mg: 400 mg | ORAL | @ 12:00:00

## 2021-11-10 MED ADMIN — sodium chloride 7% nebulizer solution 4 mL: 4 mL | RESPIRATORY_TRACT | @ 01:00:00

## 2021-11-10 MED ADMIN — colistimethate (COLYMYCIN) 150 mg in sodium chloride (NS) 4 mL inhalation: 150 mg | RESPIRATORY_TRACT | @ 01:00:00

## 2021-11-10 MED ADMIN — oxyCODONE (ROXICODONE) immediate release tablet 5 mg: 5 mg | ORAL | @ 15:00:00 | Stop: 2021-11-17

## 2021-11-10 MED ADMIN — FLUoxetine (PROzac) capsule 60 mg: 60 mg | ORAL | @ 12:00:00

## 2021-11-10 MED ADMIN — amitriptyline (ELAVIL) tablet 100 mg: 100 mg | ORAL | @ 01:00:00

## 2021-11-10 MED ADMIN — albuterol 2.5 mg /3 mL (0.083 %) nebulizer solution 2.5 mg: 2.5 mg | RESPIRATORY_TRACT | @ 17:00:00 | Stop: 2021-11-10

## 2021-11-10 MED ADMIN — elexacaftor-tezacaftor-ivacaft (TRIKAFTA) tablet 2 tablet  **Patient Supplied**: 2 | ORAL | @ 12:00:00

## 2021-11-10 MED ADMIN — insulin lispro (HumaLOG) injection 30 Units: 30 [IU] | SUBCUTANEOUS | @ 13:00:00

## 2021-11-10 MED ADMIN — insulin glargine (LANTUS) injection 40 Units: 40 [IU] | SUBCUTANEOUS | @ 02:00:00

## 2021-11-10 MED ADMIN — piperacillin-tazobactam (ZOSYN) IVPB (premix) 4.5 g: 4.5 g | INTRAVENOUS | @ 19:00:00 | Stop: 2021-11-16

## 2021-11-10 MED ADMIN — albuterol 2.5 mg /3 mL (0.083 %) nebulizer solution 2.5 mg: 2.5 mg | RESPIRATORY_TRACT | @ 21:00:00 | Stop: 2021-11-10

## 2021-11-10 MED ADMIN — elexacaftor-tezacaftor-ivacaft (TRIKAFTA) tablet 1 tablet **Patient Supplied**: 1 | ORAL | @ 01:00:00

## 2021-11-10 MED ADMIN — gabapentin (NEURONTIN) capsule 600 mg: 600 mg | ORAL | @ 12:00:00

## 2021-11-10 MED ADMIN — HYDROmorphone (PF) (DILAUDID) injection 0.5 mg: .5 mg | INTRAVENOUS | @ 01:00:00 | Stop: 2021-11-09

## 2021-11-10 MED ADMIN — atorvastatin (LIPITOR) tablet 40 mg: 40 mg | ORAL | @ 01:00:00

## 2021-11-10 MED ADMIN — cetirizine (ZyrTEC) tablet 10 mg: 10 mg | ORAL | @ 12:00:00

## 2021-11-10 MED ADMIN — lisinopriL (PRINIVIL,ZESTRIL) tablet 10 mg: 10 mg | ORAL | @ 13:00:00

## 2021-11-10 MED ADMIN — fluticasone propionate (FLONASE) 50 mcg/actuation nasal spray 2 spray: 2 | NASAL | @ 13:00:00

## 2021-11-10 MED ADMIN — pancrelipase (Lip-Prot-Amyl) (CREON) 24,000-76,000 -120,000 unit delayed release capsule 288,000 units of lipase: 288000 [IU] | ORAL | @ 22:00:00

## 2021-11-10 MED ADMIN — pediatric multivitamin-vit D3 1,500 unit-vit K 800 mcg (MVW COMPLETE FORMULATION) capsule: 2 | ORAL | @ 13:00:00

## 2021-11-10 MED ADMIN — pantoprazole (PROTONIX) EC tablet 40 mg: 40 mg | ORAL | @ 13:00:00

## 2021-11-10 MED ADMIN — insulin lispro (HumaLOG) injection 30 Units: 30 [IU] | SUBCUTANEOUS | @ 17:00:00

## 2021-11-10 MED ADMIN — dornase alfa (PULMOZYME) 1 mg/mL solution 2.5 mg: 2.5 mg | RESPIRATORY_TRACT | @ 13:00:00

## 2021-11-10 MED ADMIN — phytonadione (vitamin K1) (MEPHYTON) tablet 5 mg: 5 mg | ORAL | @ 13:00:00 | Stop: 2021-11-14

## 2021-11-10 MED ADMIN — albuterol 2.5 mg /3 mL (0.083 %) nebulizer solution 2.5 mg: 2.5 mg | RESPIRATORY_TRACT | @ 13:00:00 | Stop: 2021-11-10

## 2021-11-10 MED ADMIN — albuterol 2.5 mg /3 mL (0.083 %) nebulizer solution 2.5 mg: 2.5 mg | RESPIRATORY_TRACT | @ 01:00:00

## 2021-11-10 MED ADMIN — colistimethate (COLYMYCIN) 150 mg in sodium chloride (NS) 4 mL inhalation: 150 mg | RESPIRATORY_TRACT | @ 13:00:00

## 2021-11-10 MED ADMIN — piperacillin-tazobactam (ZOSYN) IVPB (premix) 4.5 g: 4.5 g | INTRAVENOUS | @ 14:00:00 | Stop: 2021-11-16

## 2021-11-10 MED ADMIN — pancrelipase (Lip-Prot-Amyl) (CREON) 24,000-76,000 -120,000 unit delayed release capsule 288,000 units of lipase: 288000 [IU] | ORAL | @ 14:00:00

## 2021-11-10 MED ADMIN — insulin lispro (HumaLOG) injection 0-20 Units: 0-20 [IU] | SUBCUTANEOUS | @ 22:00:00

## 2021-11-10 MED ADMIN — pramipexole (MIRAPEX) tablet 0.25 mg: .25 mg | ORAL | @ 01:00:00

## 2021-11-10 MED ADMIN — fluticasone furoate-vilanteroL (BREO ELLIPTA) 200-25 mcg/dose inhaler 1 puff: 1 | RESPIRATORY_TRACT | @ 13:00:00

## 2021-11-10 MED ADMIN — sodium chloride 7% nebulizer solution 4 mL: 4 mL | RESPIRATORY_TRACT | @ 13:00:00 | Stop: 2021-11-10

## 2021-11-10 MED ADMIN — sodium chloride 7% nebulizer solution 4 mL: 4 mL | RESPIRATORY_TRACT | @ 21:00:00 | Stop: 2021-11-10

## 2021-11-10 MED ADMIN — pantoprazole (PROTONIX) EC tablet 40 mg: 40 mg | ORAL | @ 01:00:00

## 2021-11-10 MED ADMIN — montelukast (SINGULAIR) tablet 10 mg: 10 mg | ORAL | @ 01:00:00

## 2021-11-10 MED ADMIN — rivaroxaban (XARELTO) tablet 20 mg: 20 mg | ORAL | @ 13:00:00

## 2021-11-10 MED ADMIN — piperacillin-tazobactam (ZOSYN) IVPB (premix) 4.5 g: 4.5 g | INTRAVENOUS | @ 02:00:00 | Stop: 2021-11-09

## 2021-11-10 MED ADMIN — insulin lispro (HumaLOG) injection 0-20 Units: 0-20 [IU] | SUBCUTANEOUS | @ 02:00:00

## 2021-11-10 MED ADMIN — traZODone (DESYREL) tablet 150 mg: 150 mg | ORAL | @ 01:00:00

## 2021-11-10 MED ADMIN — pancrelipase (Lip-Prot-Amyl) (CREON) 24,000-76,000 -120,000 unit delayed release capsule 288,000 units of lipase: 288000 [IU] | ORAL | @ 17:00:00

## 2021-11-10 MED ADMIN — sodium chloride 7% nebulizer solution 4 mL: 4 mL | RESPIRATORY_TRACT | @ 17:00:00 | Stop: 2021-11-10

## 2021-11-10 MED ADMIN — gabapentin (NEURONTIN) capsule 600 mg: 600 mg | ORAL | @ 19:00:00

## 2021-11-10 MED ADMIN — insulin lispro (HumaLOG) injection 0-20 Units: 0-20 [IU] | SUBCUTANEOUS | @ 17:00:00

## 2021-11-10 MED ADMIN — levoFLOXacin (LEVAQUIN) tablet 750 mg: 750 mg | ORAL | @ 13:00:00 | Stop: 2021-11-19

## 2021-11-10 NOTE — Unmapped (Signed)
Patient is a 36 year old Caucasian male with a history of cystic fibrosis who was admitted to the hospital for exacerbation in his underlying lung disease.  He has CF related diabetes mellitus and pancreatic insufficiency.    1.  Patient was admitted to the hospital for complaint of worsening symptoms for about 3 weeks.  He was admitted in coordination with Pulmonology.  He was started on the following regimen.    - Start piperacillin-tazobactam 4.5g IV q6hrs (Pseudomonal dosing)  - Start levofloxacin 750mg  PO daily  - Continue home inhaled colistin  - Sputum CF and AFB cultures are obtained.  AFB smear was negative.  Smooth and mucoid Pseudomonas aeruginosa were grown.  The smooth variant but was resistant to fluoroquinolones imipenem, with the mucoid variant resistant to imipenem with intermediate sensitivity to fluoroquinolones and meropenem.  Both drains were sensitive to piperacillin tazobactam.  - Respiratory pathogen panel was negative for COVID-19, influenza and RSV.  - airway clearance with QID vest with RT, albuterol nebs QID and 7% hypertonic saline nebs QID  - Continue dornase alpha daily  - Continue home Trikafta  - PFT's ordered and are at baseline.    Patient to continue improvement in his symptoms and his physical examination normalized to normal ventilation without adventitial breath sounds.  Patient preferred to wait in the hospital for the duration of his hospitalization.  He did report that his wife had recently contracted a viral respiratory tract infection.    2.  CF related diabetes mellitus.  The patient is on an insulin pump at home but review of his laboratory studies indicated that with home management of his pump, his hemoglobin A1c had increased to 10.6 on 11/04/2018 from a value of 9.0 on 06/08/2021.  He was seen in Dietitian consultation in the hopes of improving his carbohydrate intake.  He did admit that he is 1 who eats large portions, and this has been part of his high-calorie high-protein plan for cystic fibrosis.  He does note that when he is a prepares a meal of spaghetti he has a tendency to eat the whole thing.  He has insight into his carbohydrate intake and seems quite willing to modify his diet towards a consistent carbohydrate plan    3.  Cystic fibrosis related pancreatic insufficiency.  Home Creon is continued with multivitamins.  INR level checked and did rise to 2.0 on 3/30 despite twice weekly 5 mg vitamin K doses.    4.  Hypertension, his home lisinopril was continued at 10 mg daily.    5.  He does have a history of depression.  He was in good spirits during his hospitalization.  Amitriptyline and fluoxetine are continued.    6.  Hyperlipidemia, increased vascular risk of diabetes.  Home or atorvastatin was continued.  He is not on aspirin therapy.    7.  Insomnia.  He takes trazodone and melatonin.

## 2021-11-10 NOTE — Unmapped (Signed)
Visited with the patient today. Stated his respiratory needs have been met. Has his home chest vest for airway clearance. Needs a new nebulizer compressor for home. Will work to obtain one. Has cups available. No other needs at this time. Will continue to monitor.

## 2021-11-10 NOTE — Unmapped (Signed)
No acute changes on this shift. All safety and fall precautions in place.     Problem: Adult Inpatient Plan of Care  Goal: Absence of Hospital-Acquired Illness or Injury  Intervention: Identify and Manage Fall Risk  Recent Flowsheet Documentation  Taken 11/09/2021 2000 by Dillard Cannon, RN  Safety Interventions:   isolation precautions   lighting adjusted for tasks/safety   low bed   nonskid shoes/slippers when out of bed   room near unit station     Problem: Infection  Goal: Absence of Infection Signs and Symptoms  Intervention: Prevent or Manage Infection  Recent Flowsheet Documentation  Taken 11/09/2021 2000 by Dillard Cannon, RN  Isolation Precautions: contact precautions maintained

## 2021-11-10 NOTE — Unmapped (Signed)
Problem: Airway Clearance Ineffective  Goal: Effective Airway Clearance  Intervention: Promote Airway Secretion Clearance  Recent Flowsheet Documentation  Taken 11/09/2021 2110 by Verdell Face, RRT  Breathing Techniques/Airway Clearance: deep/controlled cough encouraged       Patient compliant with neb tx & airway clearance therapies. No adverse reaction. No respiratory distress noted

## 2021-11-10 NOTE — Unmapped (Signed)
[facility] Internal Medicine Hospitalist Progress Note     LOS: 7 days       Assessment/Plan:  Principal Problem:    Cystic fibrosis with pulmonary exacerbation (CMS-HCC)  Active Problems:    Essential hypertension    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)    Pancreatic insufficiency due to cystic fibrosis (CMS-HCC)    Chronic deep vein thrombosis (DVT) of lower extremity (CMS-HCC)    Long term current use of anticoagulant therapy  Resolved Problems:    * No resolved hospital problems. *         36 year old Caucasian male well-known to the hospital service for exacerbation of cystic fibrosis, admitted for IV antibiotics and managed of multiple comorbidities.    1.  CF exacerbation.  -Continue current antibiotic and airway management per Pulmonology recommendation with Zosyn being infused in his port without difficulty, p.o. levofloxacin 750 mg daily.  Medication list otherwise as shown below.  The patient will remain in the hospital for the duration of his treatment.  - Emphasized importance of taking Trikafta as outpatient, not missing any doses.    2.  Type 2 diabetes mellitus.  See 3/20 note for additional details on home use of insulin pump.  Blood sugars are varying from her and 100s well in the 200s, will increase Lantus insulin from 40 to 45 units nightly.  - Premeal insulin as well as sliding scale, ACHS Accu-Cheks.    3.  Essential hypertension.  Continue current regimen of ACE inhibition, he is normotensive.    4.  Pancreatic insufficiency, continue pancreatic enzyme therapy.    5.  History of DVT, continue anticoagulation chronically.    6.  Central chest pain from coughing.  -He typically requestss oxycodone when hospitalized.  He is complaining of some inflammatory pain from CF exacerbation and cough.  Inflammatory markers with CRP are normal.  ESR 17, not a significant elevation above 15.  -Continue 5 mg p.o. every 6 hours as needed, would not increase dose unless there is objective reason or good clinical indication otherwise.    7.  Nonspecific mild elevation in transaminases, continue to monitor with routine labs.  No specific therapy or change in medication regimen is indicated.      *DVT prophylaxis: He is fully ambulatory.    *Disposition: He will require over 2 midnights in the hospital.      Please page the [facility]) pager at [phone] with questions.      Consultants:   1.  Pulmonology      Subjective:   Playing video games, in good spirits.  No complaint of fever, chills.  Appetite is good.    Objective:       Vital signs in last 24 hours:  Temp:  [35.7 ??C (96.3 ??F)-36.7 ??C (98 ??F)] 36.4 ??C (97.5 ??F)  Heart Rate:  [80-101] 99  Resp:  [16-18] 18  BP: (113-133)/(64-86) 125/73  MAP (mmHg):  [83-100] 93  SpO2:  [94 %-98 %] 97 %    Intake/Output last 24 hours:  No intake or output data in the 24 hours ending 11/10/21 1610      Physical Exam:    Gen: Alert, NAD    HEENT: No icterus.    CV: Regular rate and rhythm.    PULM/chest: Good air exchange, lungs are clear to auscultation bilaterally.    AVW:UJWJ, Non tender, No palpable organomegaly    Ext: No C/C/E    Neuro: No acute focal deficits.  Skin: Warm, dry.      Medications:   Scheduled Meds:  ??? albuterol  2.5 mg Nebulization 4x Daily (RT)   ??? amitriptyline  100 mg Oral Nightly   ??? atorvastatin  40 mg Oral Nightly   ??? cetirizine  10 mg Oral Daily   ??? colistimetate (COLISTIN) inhalation  150 mg Inhalation BID (RT)   ??? dornase alfa  2.5 mg Inhalation Daily (RT)   ??? elexacaftor-tezacaftor-ivacaft  2 tablet Oral daily    And   ??? elexacaftor-tezacaftor-ivacaft  1 tablet Oral Q PM   ??? FLUoxetine  60 mg Oral Daily   ??? fluticasone furoate-vilanteroL  1 puff Inhalation Daily (RT)   ??? fluticasone propionate  2 spray Each Nare Daily   ??? gabapentin  600 mg Oral TID   ??? heparin, porcine (PF)  300 Units Intravenous Q MWF   ??? insulin glargine  40 Units Subcutaneous Nightly   ??? insulin lispro  0-20 Units Subcutaneous ACHS   ??? insulin lispro  30 Units Subcutaneous 3xd Meals   ??? lamoTRIgine  400 mg Oral Daily   ??? levoFLOXacin  750 mg Oral Q24H SCH   ??? lisinopriL  10 mg Oral Daily   ??? montelukast  10 mg Oral Nightly   ??? pancrelipase (Lip-Prot-Amyl)  288,000 units of lipase Oral 3xd Meals   ??? pantoprazole  40 mg Oral BID   ??? MVW Complete (pediatric multivit 61-D3-vit K)  2 capsule Oral Daily   ??? phytonadione (vitamin K1)  5 mg Oral Daily   ??? piperacillin-tazobactam (ZOSYN) IV (intermittent)  4.5 g Intravenous Q6H   ??? pramipexole  0.25 mg Oral Nightly   ??? rivaroxaban  20 mg Oral Daily   ??? sodium chloride 7%  4 mL Nebulization 4x Daily (RT)   ??? traZODone  150 mg Oral Nightly       Recent Labs     11/09/21  1246   NA 138   K 4.3   CL 101   CO2 29.8   BUN 10   CREATININE 0.75   GLU 178   CALCIUM 9.5   PROT 7.2   BILITOT 0.4   AST 45*   ALT 58*   ALKPHOS 90   ESR 17*   CRP <4.0     Recent Labs     11/09/21  1246   INR 2.03       Pending Labs     Order Current Status    Des-Gamma-Carboxy Prothrombin In process    AFB culture Preliminary result        Laboratory studies for the last 24 hours reviewed.    25 minutes, over 50% spent in counseling and coordination of care.  Tawni Levy MD

## 2021-11-10 NOTE — Unmapped (Signed)
Pt currently on Room Air tolerating well. All scheduled inhaled medications administered. No adverse reaction noted. Pt self administers home chest vest for airway clearance. No apparent distress noted at this time. RT will continue to monitor.

## 2021-11-10 NOTE — Unmapped (Signed)
Faxed prescription to [facility] for a Pari Proneb Max compressor. Note efaxed. Patient notified via My Chart.

## 2021-11-10 NOTE — Unmapped (Signed)
PULMONARY CONSULT  NOTE      Patient: Christian Young(01/30/1986)  Reason for consultation: Christian Young is a 36 y.o. male who is seen in consultation at the request of Rica Koyanagi, MD for comprehensive evaluation of Cystic Fibrosis with acute exacerbation.    Assessment and Recommendations:      Principal Problem:    Cystic fibrosis with pulmonary exacerbation (CMS-HCC)  Active Problems:    Essential hypertension    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)    Pancreatic insufficiency due to cystic fibrosis (CMS-HCC)    Chronic deep vein thrombosis (DVT) of lower extremity (CMS-HCC)    Long term current use of anticoagulant therapy  Resolved Problems:    * No resolved hospital problems. Christian Young is a 36 y.o. year old male with a history of cystic fibrosis (genetics [phone] and [phone] insertion) on Trikafta presenting for acute exacerbation of bronchiectasis.     Cystic Fibrosis with pulmonary manifestation with  acute exacerbation of bronchiectasis: Presentation consistent with a CF pulmonary exacerbation as characterized by cough, fatigue, increased SOB and increased sputum production.   - Piperacillin-tazobactam 4.5g IV q6hrs and levofloxacin 750mg  PO daily.  - Also continuing inhaled colistin as new suppressive regimen.  - Airway clearance with QID vest, albuterol, 7% HTS, Pulmozyme.  Encouraged him to use his percussive vest 4x/day with nebs while hospitalized and to notify RT if the use so that is documented in his chart.  - Continue Breo daily.  - Continue Trikafta from patient home supply.  - PFTs done this week, at baseline, I do not see need to repeat these later in the course.  - Vitamin K 5 mg daily for 5 days because elevated INR. Recheck before discharge.    - Twice weekly labs sufficient on this current antibiotics course. Please obtain CBC-D, CMP, CRP and ESR tomorrow.   - For access, patient has Implanted Port, now being used, no issues thus far.  - As noted previously, patient does not want to return home for duration of his treatment course, wishes to complete inpatient and discharge on 4/6 (to be home for nephew's birthday on 4/7.    Cystic Fibrosis with GI manifestations with  CF-related intestinal malabsorption (VQQ:VZDG mass index is 32.34 kg/m??.)  - High-calorie, high-protein diet with double portions.   - Monitor with weights twice weekly.  - Continue Creon and MVW vitamins during admission. Corrected vitamins to 2 tabs daily.  - due to check Vitamin A, D, E levels in 11/2021 - please obtain with labs at end of exacerbation course     Cystic Fibrosis with GI manifestations with  CF-related  diabetes  - monitor blood sugars frequently, glargine and lispro as ordered.    Left forearm pain at IV site:  - No evidence of thrombophlebitis.  Recommended warm compresses.    We appreciate the opportunity to assist in the care of this patient.  Please page [phone] with any questions.    Viona Gilmore, MD MPH    Subjective:      History of Present Illness:  [name] is a 36 y.o. male admitted for CF with acute exacerbation.    He presents with his usual symptoms of cough, increased sputum, and SOB. He has had several exacerbations similar to this and is well known to the pulmonology service (see note by Dr. Audrea Young).    Interval history 11/10/21: Feeling a little better than yesterday.  Use [employer] vest twice  in past 24 hours.  Left forearm is still swollen and painful.  Last night needed dilaudid due to pain.      Review of Systems: A comprehensive review of systems was performed and was negative except as above in HPI    Past medical, past surgical, family, and social histories reviewed from initial consult note and otherwise unchanged.    Medications reviewed in Epic.  Antibiotics with Zosyn and PO levofloxacin.    Allergies as of 11/03/2021 - Reviewed 11/03/2021   Allergen Reaction Noted    Aztreonam Anaphylaxis, Hives, Nausea And Vomiting, and Rash 09/06/2015    [place] [aztreonam lysine] Anaphylaxis 12/27/2016    Cefepime Itching, Nausea Only, and Other (See Comments) 09/06/2015    Other Anaphylaxis and Other (See Comments) 09/06/2015    Slo-bid 100 Anaphylaxis 06/20/2017    Tobramycin Tinnitus 09/06/2015    Banana Itching and Nausea And Vomiting 07/29/2016        Objective:      Physical Exam:  Vitals:    11/09/21 2140 11/09/21 2319 11/10/21 0700 11/10/21 0811   BP:  113/64 (P) 118/77 122/80   Pulse: 85 84 (P) 86 80   Resp: 18 16 (P) 16 16   Temp:  35.8 ??C (96.4 ??F) (P) 35.7 ??C (96.3 ??F) 35.8 ??C (96.4 ??F)   TempSrc:  Oral (P) Oral Oral   SpO2: 97% 94% (P) 98% 98%   Weight:       Height:         GEN: NAD, sitting in chair  EYES: EOMI, sclera anicteric  ENT: Trachea midline, MMM  CV: RRR, no murmurs appreciated  PULM: Coarse breath sounds but no overt wheezing, rhonchi, rales.  Easy work of breathing without accessory muscle use.  ABD: soft, non-tender, non-distended  EXT: No edema.  Bruising and mild swelling over left forearm just distal to the elbow.  No palpable cord.  No erythema or increased warmth.  NEURO: Grossly Non-focal, moving all extremities normally  PSYCH: A+Ox3, appropriate       Diagnostic Review:   All labs and images were personally reviewed.    Sputum culture 3/25: 2+ mucoid PsA, 2+ smooth PsA, 2+ OP flora.    FVL 11/07/21: FEV1 3.26 (70%), FVC 4.28 (74%), 0.76  Overall similar to last study

## 2021-11-11 MED ADMIN — levoFLOXacin (LEVAQUIN) tablet 750 mg: 750 mg | ORAL | @ 13:00:00 | Stop: 2021-11-19

## 2021-11-11 MED ADMIN — pancrelipase (Lip-Prot-Amyl) (CREON) 24,000-76,000 -120,000 unit delayed release capsule 288,000 units of lipase: 288000 [IU] | ORAL | @ 20:00:00

## 2021-11-11 MED ADMIN — lamoTRIgine (LaMICtal) tablet 400 mg: 400 mg | ORAL | @ 13:00:00

## 2021-11-11 MED ADMIN — piperacillin-tazobactam (ZOSYN) IVPB (premix) 4.5 g: 4.5 g | INTRAVENOUS | @ 13:00:00 | Stop: 2021-11-16

## 2021-11-11 MED ADMIN — rivaroxaban (XARELTO) tablet 20 mg: 20 mg | ORAL | @ 13:00:00

## 2021-11-11 MED ADMIN — albuterol 2.5 mg /3 mL (0.083 %) nebulizer solution 2.5 mg: 2.5 mg | RESPIRATORY_TRACT | @ 01:00:00 | Stop: 2021-11-10

## 2021-11-11 MED ADMIN — lisinopriL (PRINIVIL,ZESTRIL) tablet 10 mg: 10 mg | ORAL | @ 13:00:00

## 2021-11-11 MED ADMIN — cetirizine (ZyrTEC) tablet 10 mg: 10 mg | ORAL | @ 13:00:00

## 2021-11-11 MED ADMIN — gabapentin (NEURONTIN) capsule 600 mg: 600 mg | ORAL | @ 02:00:00

## 2021-11-11 MED ADMIN — fluticasone furoate-vilanteroL (BREO ELLIPTA) 200-25 mcg/dose inhaler 1 puff: 1 | RESPIRATORY_TRACT | @ 12:00:00

## 2021-11-11 MED ADMIN — oxyCODONE (ROXICODONE) immediate release tablet 5 mg: 5 mg | ORAL | @ 20:00:00 | Stop: 2021-11-17

## 2021-11-11 MED ADMIN — pantoprazole (PROTONIX) EC tablet 40 mg: 40 mg | ORAL | @ 02:00:00

## 2021-11-11 MED ADMIN — elexacaftor-tezacaftor-ivacaft (TRIKAFTA) tablet 1 tablet **Patient Supplied**: 1 | ORAL | @ 02:00:00

## 2021-11-11 MED ADMIN — gabapentin (NEURONTIN) capsule 600 mg: 600 mg | ORAL | @ 13:00:00

## 2021-11-11 MED ADMIN — HYDROmorphone (PF) (DILAUDID) injection 0.5 mg: .5 mg | INTRAVENOUS | @ 16:00:00 | Stop: 2021-11-11

## 2021-11-11 MED ADMIN — sodium chloride 7% nebulizer solution 4 mL: 4 mL | RESPIRATORY_TRACT | @ 12:00:00

## 2021-11-11 MED ADMIN — albuterol 2.5 mg /3 mL (0.083 %) nebulizer solution 2.5 mg: 2.5 mg | RESPIRATORY_TRACT | @ 19:00:00

## 2021-11-11 MED ADMIN — albuterol 2.5 mg /3 mL (0.083 %) nebulizer solution 2.5 mg: 2.5 mg | RESPIRATORY_TRACT | @ 15:00:00

## 2021-11-11 MED ADMIN — montelukast (SINGULAIR) tablet 10 mg: 10 mg | ORAL | @ 02:00:00

## 2021-11-11 MED ADMIN — dornase alfa (PULMOZYME) 1 mg/mL solution 2.5 mg: 2.5 mg | RESPIRATORY_TRACT | @ 12:00:00

## 2021-11-11 MED ADMIN — fluticasone propionate (FLONASE) 50 mcg/actuation nasal spray 2 spray: 2 | NASAL | @ 13:00:00

## 2021-11-11 MED ADMIN — pediatric multivitamin-vit D3 1,500 unit-vit K 800 mcg (MVW COMPLETE FORMULATION) capsule: 2 | ORAL | @ 15:00:00

## 2021-11-11 MED ADMIN — oxyCODONE (ROXICODONE) immediate release tablet 5 mg: 5 mg | ORAL | @ 02:00:00 | Stop: 2021-11-17

## 2021-11-11 MED ADMIN — pramipexole (MIRAPEX) tablet 0.25 mg: .25 mg | ORAL | @ 02:00:00

## 2021-11-11 MED ADMIN — piperacillin-tazobactam (ZOSYN) IVPB (premix) 4.5 g: 4.5 g | INTRAVENOUS | @ 19:00:00 | Stop: 2021-11-16

## 2021-11-11 MED ADMIN — insulin lispro (HumaLOG) injection 0-20 Units: 0-20 [IU] | SUBCUTANEOUS | @ 02:00:00

## 2021-11-11 MED ADMIN — colistimethate (COLYMYCIN) 150 mg in sodium chloride (NS) 4 mL inhalation: 150 mg | RESPIRATORY_TRACT | @ 01:00:00

## 2021-11-11 MED ADMIN — gabapentin (NEURONTIN) capsule 600 mg: 600 mg | ORAL | @ 19:00:00

## 2021-11-11 MED ADMIN — piperacillin-tazobactam (ZOSYN) IVPB (premix) 4.5 g: 4.5 g | INTRAVENOUS | @ 02:00:00 | Stop: 2021-11-16

## 2021-11-11 MED ADMIN — pancrelipase (Lip-Prot-Amyl) (CREON) 24,000-76,000 -120,000 unit delayed release capsule 288,000 units of lipase: 288000 [IU] | ORAL | @ 13:00:00

## 2021-11-11 MED ADMIN — sodium chloride 7% nebulizer solution 4 mL: 4 mL | RESPIRATORY_TRACT | @ 23:00:00

## 2021-11-11 MED ADMIN — sodium chloride 7% nebulizer solution 4 mL: 4 mL | RESPIRATORY_TRACT | @ 19:00:00

## 2021-11-11 MED ADMIN — insulin lispro (HumaLOG) injection 30 Units: 30 [IU] | SUBCUTANEOUS | @ 22:00:00

## 2021-11-11 MED ADMIN — elexacaftor-tezacaftor-ivacaft (TRIKAFTA) tablet 2 tablet  **Patient Supplied**: 2 | ORAL | @ 13:00:00

## 2021-11-11 MED ADMIN — FLUoxetine (PROzac) capsule 60 mg: 60 mg | ORAL | @ 13:00:00

## 2021-11-11 MED ADMIN — albuterol 2.5 mg /3 mL (0.083 %) nebulizer solution 2.5 mg: 2.5 mg | RESPIRATORY_TRACT | @ 12:00:00

## 2021-11-11 MED ADMIN — atorvastatin (LIPITOR) tablet 40 mg: 40 mg | ORAL | @ 02:00:00

## 2021-11-11 MED ADMIN — colistimethate (COLYMYCIN) 150 mg in sodium chloride (NS) 4 mL inhalation: 150 mg | RESPIRATORY_TRACT | @ 12:00:00

## 2021-11-11 MED ADMIN — pantoprazole (PROTONIX) EC tablet 40 mg: 40 mg | ORAL | @ 13:00:00

## 2021-11-11 MED ADMIN — pancrelipase (Lip-Prot-Amyl) (CREON) 24,000-76,000 -120,000 unit delayed release capsule 288,000 units of lipase: 288000 [IU] | ORAL | @ 16:00:00

## 2021-11-11 MED ADMIN — oxyCODONE (ROXICODONE) immediate release tablet 5 mg: 5 mg | ORAL | @ 13:00:00 | Stop: 2021-11-17

## 2021-11-11 MED ADMIN — insulin glargine (LANTUS) injection 45 Units: 45 [IU] | SUBCUTANEOUS | @ 02:00:00

## 2021-11-11 MED ADMIN — insulin lispro (HumaLOG) injection 30 Units: 30 [IU] | SUBCUTANEOUS | @ 13:00:00

## 2021-11-11 MED ADMIN — sodium chloride 7% nebulizer solution 4 mL: 4 mL | RESPIRATORY_TRACT | @ 15:00:00

## 2021-11-11 MED ADMIN — piperacillin-tazobactam (ZOSYN) IVPB (premix) 4.5 g: 4.5 g | INTRAVENOUS | @ 07:00:00 | Stop: 2021-11-16

## 2021-11-11 MED ADMIN — sodium chloride 7% nebulizer solution 4 mL: 4 mL | RESPIRATORY_TRACT | @ 01:00:00 | Stop: 2021-11-10

## 2021-11-11 MED ADMIN — amitriptyline (ELAVIL) tablet 100 mg: 100 mg | ORAL | @ 02:00:00

## 2021-11-11 MED ADMIN — colistimethate (COLYMYCIN) 150 mg in sodium chloride (NS) 4 mL inhalation: 150 mg | RESPIRATORY_TRACT | @ 23:00:00

## 2021-11-11 MED ADMIN — phytonadione (vitamin K1) (MEPHYTON) tablet 5 mg: 5 mg | ORAL | @ 13:00:00 | Stop: 2021-11-14

## 2021-11-11 MED ADMIN — traZODone (DESYREL) tablet 150 mg: 150 mg | ORAL | @ 02:00:00

## 2021-11-11 MED ADMIN — albuterol 2.5 mg /3 mL (0.083 %) nebulizer solution 2.5 mg: 2.5 mg | RESPIRATORY_TRACT | @ 23:00:00

## 2021-11-11 MED ADMIN — insulin lispro (HumaLOG) injection 30 Units: 30 [IU] | SUBCUTANEOUS | @ 16:00:00

## 2021-11-11 NOTE — Unmapped (Signed)
Pt is in for CF exacerbation on IV AbX/  Pt A&Ox4, VSS, RA, afebrile.  Pain controlled with prn oxy and scheduled dilaudid  Iv abx admin as ordered.  Pt independent.  Call bell within reach, will continue to monitor.     Problem: Adult Inpatient Plan of Care  Goal: Plan of Care Review  Outcome: Progressing  Goal: Patient-Specific Goal (Individualized)  Outcome: Progressing  Goal: Absence of Hospital-Acquired Illness or Injury  Outcome: Progressing  Intervention: Identify and Manage Fall Risk  Recent Flowsheet Documentation  Taken 11/11/2021 0800 by Hassan Buckler, RN  Safety Interventions:   low bed   nonskid shoes/slippers when out of bed  Intervention: Prevent and Manage VTE (Venous Thromboembolism) Risk  Recent Flowsheet Documentation  Taken 11/11/2021 0800 by Hassan Buckler, RN  Activity Management: activity adjusted per tolerance  Goal: Optimal Comfort and Wellbeing  Outcome: Progressing  Goal: Readiness for Transition of Care  Outcome: Progressing  Goal: Rounds/Family Conference  Outcome: Progressing     Problem: Infection  Goal: Absence of Infection Signs and Symptoms  Outcome: Progressing  Intervention: Prevent or Manage Infection  Recent Flowsheet Documentation  Taken 11/11/2021 0800 by Hassan Buckler, RN  Isolation Precautions: contact precautions maintained     Problem: Diabetes Comorbidity  Goal: Blood Glucose Level Within Targeted Range  Outcome: Progressing     Problem: Airway Clearance Ineffective  Goal: Effective Airway Clearance  Outcome: Progressing  Intervention: Promote Airway Secretion Clearance  Recent Flowsheet Documentation  Taken 11/11/2021 0800 by Hassan Buckler, RN  Activity Management: activity adjusted per tolerance

## 2021-11-11 NOTE — Unmapped (Signed)
Patient vitally stable today, Lt arm old PIV site bruised and patient medicated for pain.  IV antibiotic continued today via the Left upper chest port a cath, site noted intact, no swelling, dressing intact.  Blood sugar monitored and sliding scale insulin give as noted.  Patient with no respiratory distress, remains independent with most care  Problem: Adult Inpatient Plan of Care  Goal: Plan of Care Review  Outcome: Progressing  Goal: Patient-Specific Goal (Individualized)  Outcome: Progressing  Goal: Optimal Comfort and Wellbeing  Outcome: Progressing     Problem: Infection  Goal: Absence of Infection Signs and Symptoms  Outcome: Progressing  Intervention: Prevent or Manage Infection  Recent Flowsheet Documentation  Taken 11/10/2021 0800 by Veverly Fells, RN  Isolation Precautions: contact precautions maintained     Problem: Airway Clearance Ineffective  Goal: Effective Airway Clearance  Outcome: Progressing     Problem: Diabetes Comorbidity  Goal: Blood Glucose Level Within Targeted Range  Outcome: Progressing

## 2021-11-11 NOTE — Unmapped (Signed)
Pt currently on Room Air tolerating well. All scheduled inhaled medications administered. No adverse reaction noted. Pt self administers home chest vest for airway clearance. No apparent distress noted at this time. RT will continue to monitor.

## 2021-11-11 NOTE — Unmapped (Signed)
PULMONARY CONSULT  NOTE      Patient: Christian Young([birth year])  Reason for consultation: Christian Young is a 36 y.o. male who is seen in consultation at the request of Arman Filter, * for comprehensive evaluation of Cystic Fibrosis with acute exacerbation.    Assessment and Recommendations:      Principal Problem:    Cystic fibrosis with pulmonary exacerbation (CMS-HCC)  Active Problems:    Essential hypertension    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)    Pancreatic insufficiency due to cystic fibrosis (CMS-HCC)    Chronic deep vein thrombosis (DVT) of lower extremity (CMS-HCC)    Long term current use of anticoagulant therapy  Resolved Problems:    * No resolved hospital problems. Christian Young is a 37 y.o. year old male with a history of cystic fibrosis (genetics [phone] and [phone] insertion) on Trikafta presenting for acute exacerbation of bronchiectasis.     Cystic Fibrosis with pulmonary manifestation with  acute exacerbation of bronchiectasis: Presentation consistent with a CF pulmonary exacerbation as characterized by cough, fatigue, increased SOB and increased sputum production.   - Piperacillin-tazobactam 4.5g IV q6hrs and levofloxacin 750mg  PO daily.  - Also continuing inhaled colistin as new suppressive regimen.  - Airway clearance with QID vest, albuterol, 7% HTS, Pulmozyme.  Encouraged him to use his percussive vest 4x/day with nebs while hospitalized and to notify RT if the use so that is documented in his chart.  - Continue Breo daily.  - Continue Trikafta from patient home supply.  - PFTs done this week, at baseline, I do not see need to repeat these later in the course.  - Vitamin K 5 mg daily for 5 days because elevated INR. Recheck before discharge.    - Twice weekly labs sufficient on this current antibiotics course. Please obtain CBC-D, CMP, CRP and ESR tomorrow.   - For access, patient has Implanted Port, now being used, no issues thus far.  - As noted previously, patient does not want to return home for duration of his treatment course, wishes to complete inpatient and discharge on 4/6 (to be home for nephew's birthday on 4/7.    Cystic Fibrosis with GI manifestations with  CF-related intestinal malabsorption (ION:GEXB mass index is 32.34 kg/m??.)  - High-calorie, high-protein diet with double portions.   - Monitor with weights twice weekly.  - Continue Creon and MVW vitamins during admission. Corrected vitamins to 2 tabs daily.  - due to check Vitamin A, D, E levels in 11/2021 - please obtain with labs at end of exacerbation course     Cystic Fibrosis with GI manifestations with  CF-related  diabetes  - monitor blood sugars frequently, glargine and lispro as ordered.    Left forearm pain at IV site:  - No evidence of thrombophlebitis.  Recommended warm compresses.    We appreciate the opportunity to assist in the care of this patient.  Please page [phone] with any questions.      Subjective:      History of Present Illness:  Christian Young is a 36 y.o. male admitted for CF with acute exacerbation.    He presents with his usual symptoms of cough, increased sputum, and SOB. He has had several exacerbations similar to this and is well known to the pulmonology service (see note by Dr. [name]).    Interval history 11/11/21: Feeling a little better than yesterday.  Use vest twice yesterday and intends to use more frequently today.  Review of Systems: A comprehensive review of systems was performed and was negative except as above in HPI    Past medical, past surgical, family, and social histories reviewed from initial consult note and otherwise unchanged.    Medications reviewed in Epic.  Antibiotics with Zosyn and PO levofloxacin.    Allergies as of 11/03/2021 - Reviewed 11/03/2021   Allergen Reaction Noted   ??? Aztreonam Anaphylaxis, Hives, Nausea And Vomiting, and Rash 09/06/2015   ??? [place] [aztreonam lysine] Anaphylaxis 12/27/2016   ??? Cefepime Itching, Nausea Only, and Other (See Comments) 09/06/2015   ??? Other Anaphylaxis and Other (See Comments) 09/06/2015   ??? Slo-bid 100 Anaphylaxis 06/20/2017   ??? Tobramycin Tinnitus 09/06/2015   ??? Banana Itching and Nausea And Vomiting 07/29/2016        Objective:      Physical Exam:  Vitals:    11/10/21 1556 11/10/21 2031 11/10/21 2208 11/11/21 0750   BP: 125/73  131/82 112/64   Pulse: 99 109 108 84   Resp: 18 16 18 16    Temp: 36.4 ??C (97.5 ??F)  36.4 ??C (97.5 ??F) 35.9 ??C (96.6 ??F)   TempSrc: Oral   Oral   SpO2: 97% 98% 96% 96%   Weight:       Height:         GEN: NAD, sitting in chair  EYES: EOMI, sclera anicteric  ENT: Trachea midline, MMM  CV: RRR, no murmurs appreciated  PULM: Coarse breath sounds but no overt wheezing, rhonchi, rales.  Easy work of breathing without accessory muscle use.  ABD: soft, non-tender, non-distended  EXT: No edema.  Bruising and mild swelling over left forearm just distal to the elbow.  No palpable cord.  No erythema or increased warmth.  NEURO: Grossly Non-focal, moving all extremities normally  PSYCH: A+Ox3, appropriate       Diagnostic Review:   All labs and images were personally reviewed.    Sputum culture 3/25: 2+ mucoid PsA, 2+ smooth PsA, 2+ OP flora.    FVL 11/07/21: FEV1 3.26 (70%), FVC 4.28 (74%), 0.76  Overall similar to last study

## 2021-11-11 NOTE — Unmapped (Signed)
Pt stable on RA.  BBS diminished with crackles in the bases.  Scheduled breathing txs given.  Pt used home chest vest while receiving txs.  Pt reports still coughing up moderate amounts of thick, yellow secretions.      Problem: Airway Clearance Ineffective  Goal: Effective Airway Clearance  Outcome: Progressing  Intervention: Promote Airway Secretion Clearance  Recent Flowsheet Documentation  Taken 11/10/2021 2031 by Inda Castle, RRT  Breathing Techniques/Airway Clearance: deep/controlled cough encouraged

## 2021-11-11 NOTE — Unmapped (Signed)
Pt A&Ox4, VSS, RA, afebrile.  Pain controlled with prn oxy.  Iv abx admin as ordered.  Pt independent.  Call bell within reach, will continue to monitor.    Problem: Adult Inpatient Plan of Care  Goal: Plan of Care Review  Outcome: Progressing  Goal: Patient-Specific Goal (Individualized)  Outcome: Progressing  Goal: Absence of Hospital-Acquired Illness or Injury  Outcome: Progressing  Intervention: Identify and Manage Fall Risk  Recent Flowsheet Documentation  Taken 11/10/2021 2000 by Cherlynn Perches, RN  Safety Interventions:   commode/urinal/bedpan at bedside   lighting adjusted for tasks/safety   low bed   nonskid shoes/slippers when out of bed  Intervention: Prevent Skin Injury  Recent Flowsheet Documentation  Taken 11/10/2021 2000 by Cherlynn Perches, RN  Skin Protection:   adhesive use limited   incontinence pads utilized   protective footwear used   transparent dressing maintained   tubing/devices free from skin contact  Intervention: Prevent and Manage VTE (Venous Thromboembolism) Risk  Recent Flowsheet Documentation  Taken 11/10/2021 2000 by Cherlynn Perches, RN  Activity Management: up ad lib  Goal: Optimal Comfort and Wellbeing  Outcome: Progressing  Goal: Readiness for Transition of Care  Outcome: Progressing  Goal: Rounds/Family Conference  Outcome: Progressing     Problem: Infection  Goal: Absence of Infection Signs and Symptoms  Outcome: Progressing  Intervention: Prevent or Manage Infection  Recent Flowsheet Documentation  Taken 11/10/2021 2000 by Cherlynn Perches, RN  Isolation Precautions: contact precautions maintained     Problem: Airway Clearance Ineffective  Goal: Effective Airway Clearance  Outcome: Progressing  Intervention: Promote Airway Secretion Clearance  Recent Flowsheet Documentation  Taken 11/10/2021 2000 by Cherlynn Perches, RN  Activity Management: up ad lib  Breathing Techniques/Airway Clearance: deep/controlled cough encouraged  Cough And Deep Breathing: done independently per patient     Problem: Diabetes Comorbidity  Goal: Blood Glucose Level Within Targeted Range  Outcome: Progressing

## 2021-11-12 MED ADMIN — phytonadione (vitamin K1) (MEPHYTON) tablet 5 mg: 5 mg | ORAL | @ 13:00:00 | Stop: 2021-11-14

## 2021-11-12 MED ADMIN — amitriptyline (ELAVIL) tablet 100 mg: 100 mg | ORAL | @ 01:00:00

## 2021-11-12 MED ADMIN — piperacillin-tazobactam (ZOSYN) IVPB (premix) 4.5 g: 4.5 g | INTRAVENOUS | @ 07:00:00 | Stop: 2021-11-16

## 2021-11-12 MED ADMIN — montelukast (SINGULAIR) tablet 10 mg: 10 mg | ORAL | @ 01:00:00

## 2021-11-12 MED ADMIN — rivaroxaban (XARELTO) tablet 20 mg: 20 mg | ORAL | @ 13:00:00

## 2021-11-12 MED ADMIN — piperacillin-tazobactam (ZOSYN) IVPB (premix) 4.5 g: 4.5 g | INTRAVENOUS | @ 01:00:00 | Stop: 2021-11-16

## 2021-11-12 MED ADMIN — lisinopriL (PRINIVIL,ZESTRIL) tablet 10 mg: 10 mg | ORAL | @ 13:00:00

## 2021-11-12 MED ADMIN — levoFLOXacin (LEVAQUIN) tablet 750 mg: 750 mg | ORAL | @ 13:00:00 | Stop: 2021-11-19

## 2021-11-12 MED ADMIN — pramipexole (MIRAPEX) tablet 0.25 mg: .25 mg | ORAL | @ 01:00:00

## 2021-11-12 MED ADMIN — oxyCODONE (ROXICODONE) immediate release tablet 5 mg: 5 mg | ORAL | @ 13:00:00 | Stop: 2021-11-17

## 2021-11-12 MED ADMIN — albuterol 2.5 mg /3 mL (0.083 %) nebulizer solution 2.5 mg: 2.5 mg | RESPIRATORY_TRACT | @ 19:00:00

## 2021-11-12 MED ADMIN — insulin lispro (HumaLOG) injection 30 Units: 30 [IU] | SUBCUTANEOUS | @ 18:00:00

## 2021-11-12 MED ADMIN — fluticasone furoate-vilanteroL (BREO ELLIPTA) 200-25 mcg/dose inhaler 1 puff: 1 | RESPIRATORY_TRACT | @ 12:00:00

## 2021-11-12 MED ADMIN — FLUoxetine (PROzac) capsule 60 mg: 60 mg | ORAL | @ 13:00:00

## 2021-11-12 MED ADMIN — insulin lispro (HumaLOG) injection 0-20 Units: 0-20 [IU] | SUBCUTANEOUS | @ 18:00:00

## 2021-11-12 MED ADMIN — albuterol 2.5 mg /3 mL (0.083 %) nebulizer solution 2.5 mg: 2.5 mg | RESPIRATORY_TRACT | @ 11:00:00

## 2021-11-12 MED ADMIN — sodium chloride 7% nebulizer solution 4 mL: 4 mL | RESPIRATORY_TRACT | @ 15:00:00

## 2021-11-12 MED ADMIN — sodium chloride 7% nebulizer solution 4 mL: 4 mL | RESPIRATORY_TRACT | @ 11:00:00

## 2021-11-12 MED ADMIN — dornase alfa (PULMOZYME) 1 mg/mL solution 2.5 mg: 2.5 mg | RESPIRATORY_TRACT | @ 12:00:00

## 2021-11-12 MED ADMIN — gabapentin (NEURONTIN) capsule 600 mg: 600 mg | ORAL | @ 01:00:00

## 2021-11-12 MED ADMIN — elexacaftor-tezacaftor-ivacaft (TRIKAFTA) tablet 2 tablet  **Patient Supplied**: 2 | ORAL | @ 13:00:00

## 2021-11-12 MED ADMIN — pancrelipase (Lip-Prot-Amyl) (CREON) 24,000-76,000 -120,000 unit delayed release capsule 288,000 units of lipase: 288000 [IU] | ORAL | @ 22:00:00

## 2021-11-12 MED ADMIN — atorvastatin (LIPITOR) tablet 40 mg: 40 mg | ORAL | @ 01:00:00

## 2021-11-12 MED ADMIN — piperacillin-tazobactam (ZOSYN) IVPB (premix) 4.5 g: 4.5 g | INTRAVENOUS | @ 20:00:00 | Stop: 2021-11-16

## 2021-11-12 MED ADMIN — insulin glargine (LANTUS) injection 45 Units: 45 [IU] | SUBCUTANEOUS | @ 01:00:00

## 2021-11-12 MED ADMIN — gabapentin (NEURONTIN) capsule 600 mg: 600 mg | ORAL | @ 13:00:00

## 2021-11-12 MED ADMIN — albuterol 2.5 mg /3 mL (0.083 %) nebulizer solution 2.5 mg: 2.5 mg | RESPIRATORY_TRACT | @ 15:00:00

## 2021-11-12 MED ADMIN — lamoTRIgine (LaMICtal) tablet 400 mg: 400 mg | ORAL | @ 13:00:00

## 2021-11-12 MED ADMIN — fluticasone propionate (FLONASE) 50 mcg/actuation nasal spray 2 spray: 2 | NASAL | @ 13:00:00

## 2021-11-12 MED ADMIN — colistimethate (COLYMYCIN) 150 mg in sodium chloride (NS) 4 mL inhalation: 150 mg | RESPIRATORY_TRACT | @ 12:00:00

## 2021-11-12 MED ADMIN — cetirizine (ZyrTEC) tablet 10 mg: 10 mg | ORAL | @ 13:00:00

## 2021-11-12 MED ADMIN — oxyCODONE (ROXICODONE) immediate release tablet 5 mg: 5 mg | ORAL | @ 01:00:00 | Stop: 2021-11-17

## 2021-11-12 MED ADMIN — traZODone (DESYREL) tablet 150 mg: 150 mg | ORAL | @ 01:00:00

## 2021-11-12 MED ADMIN — insulin lispro (HumaLOG) injection 30 Units: 30 [IU] | SUBCUTANEOUS | @ 13:00:00

## 2021-11-12 MED ADMIN — sodium chloride 7% nebulizer solution 4 mL: 4 mL | RESPIRATORY_TRACT | @ 19:00:00

## 2021-11-12 MED ADMIN — pantoprazole (PROTONIX) EC tablet 40 mg: 40 mg | ORAL | @ 13:00:00

## 2021-11-12 MED ADMIN — insulin lispro (HumaLOG) injection 30 Units: 30 [IU] | SUBCUTANEOUS | @ 22:00:00

## 2021-11-12 MED ADMIN — pancrelipase (Lip-Prot-Amyl) (CREON) 24,000-76,000 -120,000 unit delayed release capsule 288,000 units of lipase: 288000 [IU] | ORAL | @ 12:00:00

## 2021-11-12 MED ADMIN — pediatric multivitamin-vit D3 1,500 unit-vit K 800 mcg (MVW COMPLETE FORMULATION) capsule: 2 | ORAL | @ 18:00:00

## 2021-11-12 MED ADMIN — oxyCODONE (ROXICODONE) immediate release tablet 5 mg: 5 mg | ORAL | @ 18:00:00 | Stop: 2021-11-17

## 2021-11-12 MED ADMIN — HYDROmorphone (PF) (DILAUDID) injection 0.5 mg: .5 mg | INTRAVENOUS | @ 02:00:00 | Stop: 2021-11-11

## 2021-11-12 MED ADMIN — gabapentin (NEURONTIN) capsule 600 mg: 600 mg | ORAL | @ 18:00:00

## 2021-11-12 MED ADMIN — pancrelipase (Lip-Prot-Amyl) (CREON) 24,000-76,000 -120,000 unit delayed release capsule 288,000 units of lipase: 288000 [IU] | ORAL | @ 17:00:00

## 2021-11-12 MED ADMIN — piperacillin-tazobactam (ZOSYN) IVPB (premix) 4.5 g: 4.5 g | INTRAVENOUS | @ 13:00:00 | Stop: 2021-11-16

## 2021-11-12 MED ADMIN — elexacaftor-tezacaftor-ivacaft (TRIKAFTA) tablet 1 tablet **Patient Supplied**: 1 | ORAL | @ 01:00:00

## 2021-11-12 MED ADMIN — pantoprazole (PROTONIX) EC tablet 40 mg: 40 mg | ORAL | @ 01:00:00

## 2021-11-12 NOTE — Unmapped (Signed)
[facility] Internal Medicine Hospitalist Progress Note     LOS: 8 days       Assessment/Plan:  Principal Problem:    Cystic fibrosis with pulmonary exacerbation (CMS-HCC)  Active Problems:    Essential hypertension    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)    Pancreatic insufficiency due to cystic fibrosis (CMS-HCC)    Chronic deep vein thrombosis (DVT) of lower extremity (CMS-HCC)    Long term current use of anticoagulant therapy  Resolved Problems:    * No resolved hospital problems. *         36 year old Caucasian Young well-known to the hospital service for exacerbation of cystic fibrosis, admitted for IV antibiotics and managed of multiple comorbidities.    1.  CF exacerbation.  -Continue current antibiotic and airway management per Pulmonology recommendation with Zosyn being infused in his port without difficulty, p.o. levofloxacin 750 mg daily.  Medication list otherwise as shown below.  The patient will remain in the hospital for the duration of his treatment through 11/17/21  - Emphasized importance of taking Trikafta as outpatient, not missing any doses.    2.  Type 2 diabetes mellitus.  Fasting glucose improved to 149 after increasing Lantus last night. Unable to use home insulin pump while admitted.   - Continue Lantus 45 units qhs  - Premeal insulin as well as sliding scale, ACHS Accu-Cheks.    3.  Essential hypertension.  Continue current regimen of ACE inhibition, he is normotensive.    4.  Pancreatic insufficiency, continue pancreatic enzyme therapy.    5.  History of DVT, continue anticoagulation chronically.    6.  Central chest pain from coughing.  -He typically requestss oxycodone when hospitalized.  He is complaining of some inflammatory pain from CF exacerbation and cough.  Inflammatory markers with CRP are normal.  ESR 17, not a significant elevation above 15.  -Continue 5 mg p.o. every 6 hours as needed, would not increase dose unless there is objective reason or good clinical indication otherwise.    7.  Nonspecific mild elevation in transaminases, continue to monitor with routine labs.  No specific therapy or change in medication regimen is indicated.      *DVT prophylaxis: He is fully ambulatory.    *Disposition: He will require over 2 midnights in the hospital. Anticipate discharge home on 11/17/21 after completing IV abx course.       Please page the [facility]) pager at [phone] with questions.      Consultants:   1.  Pulmonology      Subjective:   No acute events overnight. Breathing is improved but not back to baseline. Hasn't had any cough, sputum production, and no hemoptysis today. Denies nausea/vomiting and is eating well. Complains of pain especially in his left anterior forearm at the IV infiltration/hematoma site.     Objective:       Vital signs in last 24 hours:  Temp:  [35.9 ??C (96.6 ??F)-36.9 ??C (98.5 ??F)] 36.9 ??C (98.5 ??F)  Heart Rate:  [84-109] 96  Resp:  [16-18] 16  BP: (112-131)/(64-82) 121/82  MAP (mmHg):  [82-100] 97  SpO2:  [96 %-98 %] 97 %    Intake/Output last 24 hours:    Intake/Output Summary (Last 24 hours) at 11/11/2021 1741  Last data filed at 11/11/2021 1449  Gross per 24 hour   Intake 940 ml   Output 0 ml   Net 940 ml  Physical Exam:    Gen: Sitting up in chair, in NAD  Eyes: EOMI, conjunctivae clear  ENT: MMM  CV: RRR, nl S1 S2, no m/r/g  Pulm: CTAB, nl WOB on RA, no wheezes or crackles  Abd: Soft, NTND, +BS  Ext: WWP, ecchymosis over left anterior forearm, mildly tender  Skin: No rashes or lesions  Neuro: A&O x 3, no focal deficits  Psych: Appropriate affect        Medications:   Scheduled Meds:  ??? albuterol  2.5 mg Nebulization 4x Daily   ??? amitriptyline  100 mg Oral Nightly   ??? atorvastatin  40 mg Oral Nightly   ??? cetirizine  10 mg Oral Daily   ??? colistimetate (COLISTIN) inhalation  150 mg Inhalation BID (RT)   ??? dornase alfa  2.5 mg Inhalation Daily (RT)   ??? elexacaftor-tezacaftor-ivacaft  2 tablet Oral daily    And   ??? elexacaftor-tezacaftor-ivacaft  1 tablet Oral Q PM   ??? FLUoxetine  60 mg Oral Daily   ??? fluticasone furoate-vilanteroL  1 puff Inhalation Daily (RT)   ??? fluticasone propionate  2 spray Each Nare Daily   ??? gabapentin  600 mg Oral TID   ??? heparin, porcine (PF)  300 Units Intravenous Q MWF   ??? insulin glargine  45 Units Subcutaneous Nightly   ??? insulin lispro  0-20 Units Subcutaneous ACHS   ??? insulin lispro  30 Units Subcutaneous 3xd Meals   ??? lamoTRIgine  400 mg Oral Daily   ??? levoFLOXacin  750 mg Oral Q24H SCH   ??? lisinopriL  10 mg Oral Daily   ??? montelukast  10 mg Oral Nightly   ??? pancrelipase (Lip-Prot-Amyl)  288,000 units of lipase Oral 3xd Meals   ??? pantoprazole  40 mg Oral BID   ??? MVW Complete (pediatric multivit 61-D3-vit K)  2 capsule Oral Daily   ??? phytonadione (vitamin K1)  5 mg Oral Daily   ??? piperacillin-tazobactam (ZOSYN) IV (intermittent)  4.5 g Intravenous Q6H   ??? pramipexole  0.25 mg Oral Nightly   ??? rivaroxaban  20 mg Oral Daily   ??? sodium chloride 7%  4 mL Nebulization 4x Daily   ??? traZODone  150 mg Oral Nightly       Recent Labs     11/09/21  1246   NA 138   K 4.3   CL 101   CO2 29.8   BUN 10   CREATININE 0.75   GLU 178   CALCIUM 9.5   PROT 7.2   BILITOT 0.4   AST 45*   ALT 58*   ALKPHOS 90   ESR 17*   CRP <4.0     Recent Labs     11/09/21  1246   INR 2.03       Pending Labs     Order Current Status    Des-Gamma-Carboxy Prothrombin In process    AFB culture Preliminary result        Laboratory studies for the last 24 hours reviewed.

## 2021-11-12 NOTE — Unmapped (Signed)
PULMONARY CONSULT  NOTE      Patient: Christian Young(1986-08-10)  Reason for consultation: Christian Young is a 36 y.o. male who is seen in consultation at the request of Arman Filter, * for comprehensive evaluation of Cystic Fibrosis with acute exacerbation.    Assessment and Recommendations:      Principal Problem:    Cystic fibrosis with pulmonary exacerbation (CMS-HCC)  Active Problems:    Essential hypertension    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)    Pancreatic insufficiency due to cystic fibrosis (CMS-HCC)    Chronic deep vein thrombosis (DVT) of lower extremity (CMS-HCC)    Long term current use of anticoagulant therapy  Resolved Problems:    * No resolved hospital problems. Christian Young is a 36 y.o. year old male with a history of cystic fibrosis (genetics [phone] and [phone] insertion) on Trikafta presenting for acute exacerbation of bronchiectasis.     Cystic Fibrosis with pulmonary manifestation with  acute exacerbation of bronchiectasis: Presentation consistent with a CF pulmonary exacerbation as characterized by cough, fatigue, increased SOB and increased sputum production.   - Piperacillin-tazobactam 4.5g IV q6hrs and levofloxacin 750mg  PO daily.  - Also continuing inhaled colistin as new suppressive regimen.  - Airway clearance with QID vest, albuterol, 7% HTS, Pulmozyme.  Encouraged him to use his percussive vest 4x/day with nebs while hospitalized and to notify RT if the use so that is documented in his chart.  - Continue Breo daily.  - Continue Trikafta from patient home supply.  - PFTs done this week, at baseline, I do not see need to repeat these later in the course.  - Vitamin K 5 mg daily for 5 days because elevated INR. Recheck before discharge.    - Twice weekly labs sufficient on this current antibiotics course. Please obtain CBC-D, CMP, CRP and ESR tomorrow.   - For access, patient has Implanted Port, now being used, no issues thus far.  - As noted previously, patient does not want to return home for duration of his treatment course, wishes to complete inpatient and discharge on 4/6 (to be home for nephew's birthday on 4/7.    Cystic Fibrosis with GI manifestations with  CF-related intestinal malabsorption (OVF:IEPP mass index is 32.34 kg/m??.)  - High-calorie, high-protein diet with double portions.   - Monitor with weights twice weekly.  - Continue Creon and MVW vitamins during admission. Corrected vitamins to 2 tabs daily.  - due to check Vitamin A, D, E levels in 11/2021 - please obtain with labs at end of exacerbation course     Cystic Fibrosis with GI manifestations with  CF-related  diabetes  - monitor blood sugars frequently, glargine and lispro as ordered.    Left forearm pain at IV site:  - No evidence of thrombophlebitis.  Recommended warm compresses.    We appreciate the opportunity to assist in the care of this patient.  Please page [phone] with any questions.      Subjective:      History of Present Illness:  Christian Young is a 36 y.o. male admitted for CF with acute exacerbation.    He presents with his usual symptoms of cough, increased sputum, and SOB. He has had several exacerbations similar to this and is well known to the pulmonology service (see note by Dr. [name]).    Interval history 11/12/21: Reports feeling much better today. Still with LUE pain but has not requested warm compresses; I encouraged him  to do so. Better use of chest vest yesterday.    Review of Systems: A comprehensive review of systems was performed and was negative except as above in HPI    Past medical, past surgical, family, and social histories reviewed from initial consult note and otherwise unchanged.    Medications reviewed in Epic.  Antibiotics with Zosyn and PO levofloxacin.    Allergies as of 11/03/2021 - Reviewed 11/03/2021   Allergen Reaction Noted   ??? Aztreonam Anaphylaxis, Hives, Nausea And Vomiting, and Rash 09/06/2015   ??? [place] [aztreonam lysine] Anaphylaxis 12/27/2016   ??? Cefepime Itching, Nausea Only, and Other (See Comments) 09/06/2015   ??? Other Anaphylaxis and Other (See Comments) 09/06/2015   ??? Slo-bid 100 Anaphylaxis 06/20/2017   ??? Tobramycin Tinnitus 09/06/2015   ??? Banana Itching and Nausea And Vomiting 07/29/2016        Objective:      Physical Exam:  Vitals:    11/11/21 1447 11/11/21 1927 11/11/21 2111 11/12/21 0853   BP: 121/82  131/93 113/75   Pulse: 96 85 104 98   Resp: 16 16 21 18    Temp: 36.9 ??C (98.5 ??F)  36.7 ??C (98.1 ??F) 36.7 ??C (98 ??F)   TempSrc: Temporal  Oral Temporal   SpO2: 97% 97% 98% 97%   Weight:       Height:         GEN: NAD, standing beside bed  EYES: EOMI, sclera anicteric  ENT: Trachea midline, MMM  CV: RRR, no murmurs appreciated  PULM: Coarse breath sounds but no overt wheezing, rhonchi, rales.  Easy work of breathing without accessory muscle use.  ABD: soft, non-tender, non-distended  EXT: No edema.  Bruising and mild swelling over left forearm just distal to the elbow.  No palpable cord.  No erythema or increased warmth.  NEURO: Grossly Non-focal, moving all extremities normally  PSYCH: A+Ox3, appropriate       Diagnostic Review:   All labs and images were personally reviewed.    Sputum culture 3/25: 2+ mucoid PsA, 2+ smooth PsA, 2+ OP flora.    FVL 11/07/21: FEV1 3.26 (70%), FVC 4.28 (74%), 0.76  Overall similar to last study

## 2021-11-12 NOTE — Unmapped (Addendum)
Pt is in for CF exacerbation on IV AbX/  Pt A&Ox4, VSS, RA, afebrile.  Pain controlled with prn oxy and scheduled dilaudid  Iv abx admin as ordered.  Pt independent.  Call bell within reach, will continue to monitor. Hypoglycemic at 1600 the n corrected with orange Juice. Post oral intake BG is 114 at 1620       Problem: Adult Inpatient Plan of Care  Goal: Plan of Care Review  Outcome: Progressing  Goal: Patient-Specific Goal (Individualized)  Outcome: Progressing  Goal: Absence of Hospital-Acquired Illness or Injury  Outcome: Progressing  Intervention: Identify and Manage Fall Risk  Recent Flowsheet Documentation  Taken 11/12/2021 0800 by Hassan Buckler, RN  Safety Interventions:   fall reduction program maintained   low bed   nonskid shoes/slippers when out of bed  Intervention: Prevent and Manage VTE (Venous Thromboembolism) Risk  Recent Flowsheet Documentation  Taken 11/12/2021 0800 by Hassan Buckler, RN  Activity Management: activity adjusted per tolerance  Goal: Optimal Comfort and Wellbeing  Outcome: Progressing  Goal: Readiness for Transition of Care  Outcome: Progressing  Goal: Rounds/Family Conference  Outcome: Progressing     Problem: Infection  Goal: Absence of Infection Signs and Symptoms  Outcome: Progressing  Intervention: Prevent or Manage Infection  Recent Flowsheet Documentation  Taken 11/12/2021 0800 by Hassan Buckler, RN  Isolation Precautions: contact precautions maintained     Problem: Airway Clearance Ineffective  Goal: Effective Airway Clearance  Outcome: Progressing  Intervention: Promote Airway Secretion Clearance  Recent Flowsheet Documentation  Taken 11/12/2021 0800 by Hassan Buckler, RN  Activity Management: activity adjusted per tolerance     Problem: Diabetes Comorbidity  Goal: Blood Glucose Level Within Targeted Range  Outcome: Progressing

## 2021-11-12 NOTE — Unmapped (Signed)
Patient remained stable  on Room Air tolerating well. All scheduled inhaled medications administered. No adverse reaction noted. Pt self administers home chest vest for airway clearance. No apparent distress noted at this time. RT will continue to monitor.

## 2021-11-12 NOTE — Unmapped (Signed)
Pt A&Ox4, VSS, RA, afebrile.  Pain controlled with prn oxy.  x1 iv dilaudid admin for L arm pain.  Iv abx admin as ordered.  Pt independent.  Call bell within reach, will continue to monitor.    Problem: Adult Inpatient Plan of Care  Goal: Plan of Care Review  Outcome: Progressing  Goal: Patient-Specific Goal (Individualized)  Outcome: Progressing  Goal: Absence of Hospital-Acquired Illness or Injury  Outcome: Progressing  Intervention: Identify and Manage Fall Risk  Recent Flowsheet Documentation  Taken 11/11/2021 2000 by Cherlynn Perches, RN  Safety Interventions:   lighting adjusted for tasks/safety   low bed   nonskid shoes/slippers when out of bed  Intervention: Prevent Skin Injury  Recent Flowsheet Documentation  Taken 11/11/2021 2000 by Cherlynn Perches, RN  Skin Protection:   adhesive use limited   tubing/devices free from skin contact   transparent dressing maintained  Goal: Optimal Comfort and Wellbeing  Outcome: Progressing  Goal: Readiness for Transition of Care  Outcome: Progressing  Goal: Rounds/Family Conference  Outcome: Progressing     Problem: Infection  Goal: Absence of Infection Signs and Symptoms  Outcome: Progressing  Intervention: Prevent or Manage Infection  Recent Flowsheet Documentation  Taken 11/11/2021 2000 by Cherlynn Perches, RN  Isolation Precautions: contact precautions maintained     Problem: Airway Clearance Ineffective  Goal: Effective Airway Clearance  Outcome: Progressing  Intervention: Promote Airway Secretion Clearance  Recent Flowsheet Documentation  Taken 11/11/2021 2000 by Cherlynn Perches, RN  Cough And Deep Breathing: done independently per patient     Problem: Diabetes Comorbidity  Goal: Blood Glucose Level Within Targeted Range  Outcome: Progressing

## 2021-11-12 NOTE — Unmapped (Signed)
Patient stable on room air. BBS diminished but clear. Breathing treatments given as ordered. Chest vest done for airway clearance

## 2021-11-12 NOTE — Unmapped (Signed)
Hospital Medicine Daily Progress Note    Assessment/Plan:    Principal Problem:    Cystic fibrosis with pulmonary exacerbation (CMS-HCC)  Active Problems:    Essential hypertension    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)    Pancreatic insufficiency due to cystic fibrosis (CMS-HCC)    Chronic deep vein thrombosis (DVT) of lower extremity (CMS-HCC)    Long term current use of anticoagulant therapy  Resolved Problems:    * No resolved hospital problems. *                 Christian Young is a 36 y.o. male with history of history of cystic fibrosis (genetics [phone] and [phone] insertion) on Trikafta complicated by CFRD, pancreatic exocrine insufficiency that presented to [facility] with Cystic fibrosis with pulmonary exacerbation (CMS-HCC).    Cystic fibrosis with acute bronchiectasis exacerbation: presented with increased cough with sputum production, fatigue, dyspnea. Clinically improving on current antibiotic regimen.  - Appreciate Pulmonology recommendations  - Continue IV Zosyn, IV colistin, and PO levofloxacin x 14 days (3/24-11/16/21); patient prefers to complete abx course inpatient   - Continue home Trikafta  - Continue airway clearance with albuterol, 7% HTS, chest vest QID and dornase daily  - Continue Breo inhaler  - Check vitamin A, D, E, K levels at end of hospitalization (due 11/2021)    Elevated INR: INR 2.06 on 3/25. May be due to relative vitamin K deficiency in setting of CF, but could also be elevated due to Xarelto.  - Continue vitamin K 5 mg PO daily x 5 days (3/30-4/3)  - Recheck INR tomorrow 4/3    CF-related diabetes: HbA1c 10.6 on 11/03/21. Uses insulin pump at home, unable to accommodate while inpatient.  - Lantus 45 units at bedtime  - Lispro 30 units TID AC  - SSI ACHS    CF-related pancreatic exocrine insufficiency: continue pancreatic enzymes    Chest pain related to coughing: typically requests oxycodone when hospitalized for pain related to coughing. Also has history of chronic back pain. Has been counseled that he will not be prescribed opioids on discharge.  - Oxycodone 5 mg QID prn, would not increase unless objective/clinical indication to do so    History of DVT: continue Xarelto    HTN: lisinopril    FEN: High protein, high calorie diet  VTE ppx: therapeutic Xarelto  Code status: Full code  Disposition: anticipate discharge home 11/16/21 after completion of abx course    I personally spent 25 minutes face-to-face and non-face-to-face in the care of this patient, which includes all pre, intra, and post visit time on the date of service.  All documented time was specific to the E/M visit and does not include any procedures that may have been performed.    ___________________________________________________________________    Subjective:  No acute events overnight. Breathing is almost back to normal. Denies cough, sputum production, or hemoptysis. No nausea/vomiting. Having normal bowel movements. Eating and drinking well. Complains of ongoing pain in his left forearm hematoma at IV infiltration site, worse at night.     Labs/Studies:  No Labs or Studies today    Objective:  Temp:  [36.7 ??C (98 ??F)-36.9 ??C (98.5 ??F)] 36.7 ??C (98 ??F)  Heart Rate:  [85-104] 98  Resp:  [16-21] 18  BP: (113-131)/(75-93) 113/75  SpO2:  [97 %-98 %] 97 %    Gen: Sitting up in chair, in NAD  Eyes: EOMI, conjunctivae clear  ENT: MMM  CV: RRR, nl S1  S2, no m/r/g  Pulm: CTAB, nl WOB on RA, no wheezes or crackles  Abd: Soft, NTND, +BS  Ext: WWP. Mild diffuse edema in LUE forearm with slightly palpable cord on dorsal aspect of forearm near IV infiltration site, minimal surrounding erythema  Skin: No rashes or lesions  Neuro: A&O x 3, no focal deficits  Psych: Appropriate affect

## 2021-11-13 LAB — COMPREHENSIVE METABOLIC PANEL
ALBUMIN: 4.3 g/dL (ref 3.4–5.0)
ALKALINE PHOSPHATASE: 88 U/L (ref 46–116)
ALT (SGPT): 58 U/L — ABNORMAL HIGH (ref 10–49)
ANION GAP: 8 mmol/L (ref 5–14)
AST (SGOT): 40 U/L — ABNORMAL HIGH (ref <=34)
BILIRUBIN TOTAL: 0.4 mg/dL (ref 0.3–1.2)
BLOOD UREA NITROGEN: 7 mg/dL — ABNORMAL LOW (ref 9–23)
BUN / CREAT RATIO: 9
CALCIUM: 9.8 mg/dL (ref 8.7–10.4)
CHLORIDE: 102 mmol/L (ref 98–107)
CO2: 27.7 mmol/L (ref 20.0–31.0)
CREATININE: 0.76 mg/dL
EGFR CKD-EPI (2021) MALE: >90 mL/min/{1.73_m2} (ref >=60)
GLUCOSE RANDOM: 129 mg/dL (ref 70–179)
POTASSIUM: 4.7 mmol/L (ref 3.4–4.8)
PROTEIN TOTAL: 8.1 g/dL (ref 5.7–8.2)
SODIUM: 138 mmol/L (ref 135–145)

## 2021-11-13 LAB — CBC
HEMATOCRIT: 42 % (ref 39.0–48.0)
HEMOGLOBIN: 13.9 g/dL (ref 12.9–16.5)
MEAN CORPUSCULAR HEMOGLOBIN CONC: 33.1 g/dL (ref 32.0–36.0)
MEAN CORPUSCULAR HEMOGLOBIN: 26.8 pg (ref 25.9–32.4)
MEAN CORPUSCULAR VOLUME: 81 fL (ref 77.6–95.7)
MEAN PLATELET VOLUME: 7.7 fL (ref 6.8–10.7)
PLATELET COUNT: 310 10*9/L (ref 150–450)
RED BLOOD CELL COUNT: 5.19 10*12/L (ref 4.26–5.60)
RED CELL DISTRIBUTION WIDTH: 16.9 % — ABNORMAL HIGH (ref 12.2–15.2)
WBC ADJUSTED: 6.4 10*9/L (ref 3.6–11.2)

## 2021-11-13 LAB — DES-GAMMA-CARBOXY PROTHROMBIN: DES-G-CARBOXY PT: 0.2 ng/mL

## 2021-11-13 LAB — PROTIME-INR
INR: 1.17
PROTIME: 13.3 s — ABNORMAL HIGH (ref 9.8–12.8)

## 2021-11-13 LAB — C-REACTIVE PROTEIN: C-REACTIVE PROTEIN: <4 mg/L (ref <=10.0)

## 2021-11-13 MED ADMIN — pediatric multivitamin-vit D3 1,500 unit-vit K 800 mcg (MVW COMPLETE FORMULATION) capsule: 2 | ORAL | @ 16:00:00

## 2021-11-13 MED ADMIN — pramipexole (MIRAPEX) tablet 0.25 mg: .25 mg | ORAL | @ 02:00:00

## 2021-11-13 MED ADMIN — elexacaftor-tezacaftor-ivacaft (TRIKAFTA) tablet 2 tablet  **Patient Supplied**: 2 | ORAL | @ 14:00:00

## 2021-11-13 MED ADMIN — fluticasone propionate (FLONASE) 50 mcg/actuation nasal spray 2 spray: 2 | NASAL | @ 14:00:00

## 2021-11-13 MED ADMIN — pantoprazole (PROTONIX) EC tablet 40 mg: 40 mg | ORAL | @ 02:00:00

## 2021-11-13 MED ADMIN — colistimethate (COLYMYCIN) 150 mg in sodium chloride (NS) 4 mL inhalation: 150 mg | RESPIRATORY_TRACT | @ 01:00:00

## 2021-11-13 MED ADMIN — sodium chloride 7% nebulizer solution 4 mL: 4 mL | RESPIRATORY_TRACT | @ 17:00:00

## 2021-11-13 MED ADMIN — insulin lispro (HumaLOG) injection 30 Units: 30 [IU] | SUBCUTANEOUS | @ 14:00:00

## 2021-11-13 MED ADMIN — colistimethate (COLYMYCIN) 150 mg in sodium chloride (NS) 4 mL inhalation: 150 mg | RESPIRATORY_TRACT

## 2021-11-13 MED ADMIN — sodium chloride 7% nebulizer solution 4 mL: 4 mL | RESPIRATORY_TRACT | @ 13:00:00

## 2021-11-13 MED ADMIN — montelukast (SINGULAIR) tablet 10 mg: 10 mg | ORAL | @ 02:00:00

## 2021-11-13 MED ADMIN — elexacaftor-tezacaftor-ivacaft (TRIKAFTA) tablet 1 tablet **Patient Supplied**: 1 | ORAL | @ 02:00:00

## 2021-11-13 MED ADMIN — oxyCODONE (ROXICODONE) immediate release tablet 5 mg: 5 mg | ORAL | @ 19:00:00 | Stop: 2021-11-13

## 2021-11-13 MED ADMIN — elexacaftor-tezacaftor-ivacaft (TRIKAFTA) tablet 2 tablet  **Patient Supplied**: 2 | ORAL | @ 02:00:00

## 2021-11-13 MED ADMIN — gabapentin (NEURONTIN) capsule 600 mg: 600 mg | ORAL | @ 18:00:00

## 2021-11-13 MED ADMIN — heparin, porcine (PF) 100 unit/mL injection 300 Units: 300 [IU] | INTRAVENOUS | @ 14:00:00

## 2021-11-13 MED ADMIN — sodium chloride 7% nebulizer solution 4 mL: 4 mL | RESPIRATORY_TRACT

## 2021-11-13 MED ADMIN — albuterol 2.5 mg /3 mL (0.083 %) nebulizer solution 2.5 mg: 2.5 mg | RESPIRATORY_TRACT | @ 17:00:00

## 2021-11-13 MED ADMIN — lisinopriL (PRINIVIL,ZESTRIL) tablet 10 mg: 10 mg | ORAL | @ 14:00:00

## 2021-11-13 MED ADMIN — atorvastatin (LIPITOR) tablet 40 mg: 40 mg | ORAL | @ 02:00:00

## 2021-11-13 MED ADMIN — amitriptyline (ELAVIL) tablet 100 mg: 100 mg | ORAL | @ 02:00:00

## 2021-11-13 MED ADMIN — insulin lispro (HumaLOG) injection 30 Units: 30 [IU] | SUBCUTANEOUS | @ 16:00:00

## 2021-11-13 MED ADMIN — piperacillin-tazobactam (ZOSYN) IVPB (premix) 4.5 g: 4.5 g | INTRAVENOUS | @ 18:00:00 | Stop: 2021-11-16

## 2021-11-13 MED ADMIN — insulin lispro (HumaLOG) injection 0-20 Units: 0-20 [IU] | SUBCUTANEOUS | @ 02:00:00

## 2021-11-13 MED ADMIN — piperacillin-tazobactam (ZOSYN) IVPB (premix) 4.5 g: 4.5 g | INTRAVENOUS | @ 02:00:00 | Stop: 2021-11-16

## 2021-11-13 MED ADMIN — dornase alfa (PULMOZYME) 1 mg/mL solution 2.5 mg: 2.5 mg | RESPIRATORY_TRACT | @ 13:00:00

## 2021-11-13 MED ADMIN — oxyCODONE (ROXICODONE) immediate release tablet 5 mg: 5 mg | ORAL | @ 18:00:00 | Stop: 2021-11-17

## 2021-11-13 MED ADMIN — traZODone (DESYREL) tablet 150 mg: 150 mg | ORAL | @ 02:00:00

## 2021-11-13 MED ADMIN — fluticasone furoate-vilanteroL (BREO ELLIPTA) 200-25 mcg/dose inhaler 1 puff: 1 | RESPIRATORY_TRACT | @ 13:00:00

## 2021-11-13 MED ADMIN — gabapentin (NEURONTIN) capsule 600 mg: 600 mg | ORAL | @ 14:00:00

## 2021-11-13 MED ADMIN — phytonadione (vitamin K1) (MEPHYTON) tablet 5 mg: 5 mg | ORAL | @ 14:00:00 | Stop: 2021-11-13

## 2021-11-13 MED ADMIN — acetaminophen (TYLENOL) tablet 650 mg: 650 mg | ORAL | @ 18:00:00

## 2021-11-13 MED ADMIN — pantoprazole (PROTONIX) EC tablet 40 mg: 40 mg | ORAL | @ 14:00:00

## 2021-11-13 MED ADMIN — lamoTRIgine (LaMICtal) tablet 400 mg: 400 mg | ORAL | @ 14:00:00

## 2021-11-13 MED ADMIN — colistimethate (COLYMYCIN) 150 mg in sodium chloride (NS) 4 mL inhalation: 150 mg | RESPIRATORY_TRACT | @ 13:00:00

## 2021-11-13 MED ADMIN — gabapentin (NEURONTIN) capsule 600 mg: 600 mg | ORAL | @ 02:00:00

## 2021-11-13 MED ADMIN — sodium chloride 7% nebulizer solution 4 mL: 4 mL | RESPIRATORY_TRACT | @ 01:00:00

## 2021-11-13 MED ADMIN — piperacillin-tazobactam (ZOSYN) IVPB (premix) 4.5 g: 4.5 g | INTRAVENOUS | @ 14:00:00 | Stop: 2021-11-16

## 2021-11-13 MED ADMIN — albuterol 2.5 mg /3 mL (0.083 %) nebulizer solution 2.5 mg: 2.5 mg | RESPIRATORY_TRACT

## 2021-11-13 MED ADMIN — cetirizine (ZyrTEC) tablet 10 mg: 10 mg | ORAL | @ 14:00:00

## 2021-11-13 MED ADMIN — HYDROmorphone (PF) (DILAUDID) injection 0.5 mg: .5 mg | INTRAVENOUS | @ 04:00:00 | Stop: 2021-11-12

## 2021-11-13 MED ADMIN — oxyCODONE (ROXICODONE) immediate release tablet 5 mg: 5 mg | ORAL | @ 03:00:00 | Stop: 2021-11-17

## 2021-11-13 MED ADMIN — insulin glargine (LANTUS) injection 45 Units: 45 [IU] | SUBCUTANEOUS | @ 03:00:00

## 2021-11-13 MED ADMIN — FLUoxetine (PROzac) capsule 60 mg: 60 mg | ORAL | @ 14:00:00

## 2021-11-13 MED ADMIN — rivaroxaban (XARELTO) tablet 20 mg: 20 mg | ORAL | @ 14:00:00

## 2021-11-13 MED ADMIN — levoFLOXacin (LEVAQUIN) tablet 750 mg: 750 mg | ORAL | @ 14:00:00 | Stop: 2021-11-19

## 2021-11-13 MED ADMIN — pancrelipase (Lip-Prot-Amyl) (CREON) 24,000-76,000 -120,000 unit delayed release capsule 288,000 units of lipase: 288000 [IU] | ORAL | @ 14:00:00 | Stop: 2021-11-13

## 2021-11-13 MED ADMIN — pancrelipase (Lip-Prot-Amyl) (CREON) 24,000-76,000 -120,000 unit delayed release capsule 288,000 units of lipase: 288000 [IU] | ORAL | @ 16:00:00 | Stop: 2021-11-13

## 2021-11-13 MED ADMIN — piperacillin-tazobactam (ZOSYN) IVPB (premix) 4.5 g: 4.5 g | INTRAVENOUS | @ 08:00:00 | Stop: 2021-11-16

## 2021-11-13 MED ADMIN — albuterol 2.5 mg /3 mL (0.083 %) nebulizer solution 2.5 mg: 2.5 mg | RESPIRATORY_TRACT | @ 01:00:00

## 2021-11-13 MED ADMIN — albuterol 2.5 mg /3 mL (0.083 %) nebulizer solution 2.5 mg: 2.5 mg | RESPIRATORY_TRACT | @ 13:00:00

## 2021-11-13 NOTE — Unmapped (Signed)
PULMONARY CONSULT  NOTE      Patient: Christian Young(04/11/1986)  Reason for consultation: Christian Young is a 36 y.o. male who is seen in consultation at the request of Arman Filter, * for comprehensive evaluation of Cystic Fibrosis with acute exacerbation.    Assessment and Recommendations:      Principal Problem:    Cystic fibrosis with pulmonary exacerbation (CMS-HCC)  Active Problems:    Essential hypertension    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)    Pancreatic insufficiency due to cystic fibrosis (CMS-HCC)    Chronic deep vein thrombosis (DVT) of lower extremity (CMS-HCC)    Long term current use of anticoagulant therapy  Resolved Problems:    * No resolved hospital problems. Christian Young is a 36 y.o. year old male with a history of cystic fibrosis (genetics [phone] and [phone] insertion) on Trikafta presenting for acute exacerbation of bronchiectasis.     Cystic Fibrosis with pulmonary manifestation with  acute exacerbation of bronchiectasis: Presentation consistent with a CF pulmonary exacerbation as characterized by cough, fatigue, increased SOB and increased sputum production.   - Piperacillin-tazobactam 4.5g IV q6hrs and levofloxacin 750mg  PO daily.  - Also continuing inhaled colistin as new suppressive regimen.  - Airway clearance with QID vest, albuterol, 7% HTS, Pulmozyme.  Encouraged him to use his percussive vest 4x/day with nebs while hospitalized and to notify RT if the use so that is documented in his chart.  - Continue Breo daily.  - Continue Trikafta from patient home supply.  - PFTs done this week, at baseline, I do not see need to repeat these later in the course.  - Vitamin K 5 mg daily for 5 days because elevated INR. Recheck before discharge.    - Twice weekly labs sufficient on this current antibiotics course.   - For access, patient has Implanted Port, now being used, no issues thus far.  - As noted previously, patient does not want to return home for duration of his treatment course, wishes to complete inpatient and discharge home 4/5 after 3 PM Zosyn dose    Cystic Fibrosis with GI manifestations with  CF-related intestinal malabsorption (JXB:JYNW mass index is 32.34 kg/m??.)  - High-calorie, high-protein diet with double portions.   - Monitor with weights twice weekly.  - Continue Creon and MVW vitamins during admission. Corrected vitamins to 2 tabs daily.  - due to check Vitamin A, D, E levels in 11/2021 - please obtain with labs at end of exacerbation course     Cystic Fibrosis with GI manifestations with  CF-related  diabetes  - monitor blood sugars frequently, glargine and lispro as ordered.    We appreciate the opportunity to assist in the care of this patient.  Please page [phone] with any questions.      Subjective:      History of Present Illness:  Christian Young is a 36 y.o. male admitted for CF with acute exacerbation.    He presents with his usual symptoms of cough, increased sputum, and SOB. He has had several exacerbations similar to this and is well known to the pulmonology service (see note by Dr. [name]).    Interval history 11/13/21: No events overnight and no complaints this morning. Feels overall markedly better, back to his baseline.     Review of Systems: A comprehensive review of systems was performed and was negative except as above in HPI    Past medical, past surgical, family, and social  histories reviewed from initial consult note and otherwise unchanged.    Medications reviewed in Epic.  Antibiotics with Zosyn and PO levofloxacin.    Allergies as of 11/03/2021 - Reviewed 11/03/2021   Allergen Reaction Noted   ??? Aztreonam Anaphylaxis, Hives, Nausea And Vomiting, and Rash 09/06/2015   ??? [place] [aztreonam lysine] Anaphylaxis 12/27/2016   ??? Cefepime Itching, Nausea Only, and Other (See Comments) 09/06/2015   ??? Other Anaphylaxis and Other (See Comments) 09/06/2015   ??? Slo-bid 100 Anaphylaxis 06/20/2017   ??? Tobramycin Tinnitus 09/06/2015 ??? Banana Itching and Nausea And Vomiting 07/29/2016        Objective:      Physical Exam:  Vitals:    11/12/21 0853 11/12/21 1709 11/12/21 2011 11/13/21 0733   BP: 113/75 124/77 137/92 122/83   Pulse: 98 101 103 90   Resp: 18 18 19 18    Temp: 36.7 ??C (98 ??F) 36.5 ??C (97.7 ??F) 36.6 ??C (97.9 ??F) 36.3 ??C (97.4 ??F)   TempSrc: Temporal Temporal Oral Temporal   SpO2: 97% 98% 99% 100%   Weight:       Height:         GEN: NAD, standing beside bed  EYES: EOMI, sclera anicteric  ENT: Trachea midline, MMM  CV: RRR, no murmurs appreciated  PULM: Clear to ausculation bilaterally with good air movement.  Easy work of breathing without accessory muscle use.  ABD: soft, non-tender, non-distended  EXT: No edema.  NEURO: Grossly Non-focal, moving all extremities normally  PSYCH: A+Ox3, appropriate       Diagnostic Review:   All labs and images were personally reviewed.    Sputum culture 3/25: 2+ mucoid PsA, 2+ smooth PsA, 2+ OP flora.    FVL 11/07/21: FEV1 3.26 (70%), FVC 4.28 (74%), 0.76  Overall similar to last study

## 2021-11-13 NOTE — Unmapped (Signed)
Pt in with cystic fibrosis, VSS. A&O X4. Pt on RA, continent of bladder and bowel. Port drsg is clean,dry and intact. Brisk blood return noted. BGC taken and insulin given per MD order. C/o of left sided arm pain, MD notified of pain. Falls precautions maintained, bed is low and locked. Call bell within reach.    Problem: Adult Inpatient Plan of Care  Goal: Plan of Care Review  11/13/2021 0347 by Birdie Hopes, RN  Outcome: Ongoing - Unchanged  11/13/2021 0347 by Birdie Hopes, RN  Outcome: Ongoing - Unchanged  Goal: Optimal Comfort and Wellbeing  11/13/2021 0347 by Birdie Hopes, RN  Outcome: Ongoing - Unchanged  11/13/2021 0347 by Birdie Hopes, RN  Outcome: Ongoing - Unchanged  Goal: Readiness for Transition of Care  11/13/2021 0347 by Birdie Hopes, RN  Outcome: Ongoing - Unchanged  11/13/2021 0347 by Birdie Hopes, RN  Outcome: Ongoing - Unchanged     Problem: Infection  Goal: Absence of Infection Signs and Symptoms  11/13/2021 0347 by Birdie Hopes, RN  Outcome: Ongoing - Unchanged  11/13/2021 0347 by Birdie Hopes, RN  Outcome: Ongoing - Unchanged  Intervention: Prevent or Manage Infection  Recent Flowsheet Documentation  Taken 11/12/2021 2000 by Birdie Hopes, RN  Isolation Precautions: contact precautions maintained     Problem: Diabetes Comorbidity  Goal: Blood Glucose Level Within Targeted Range  11/13/2021 0347 by Birdie Hopes, RN  Outcome: Ongoing - Unchanged  11/13/2021 0347 by Birdie Hopes, RN  Outcome: Ongoing - Unchanged     Problem: Pain Acute  Goal: Acceptable Pain Control and Functional Ability  Outcome: Ongoing - Unchanged     Problem: Adult Inpatient Plan of Care  Goal: Absence of Hospital-Acquired Illness or Injury  Intervention: Identify and Manage Fall Risk  Recent Flowsheet Documentation  Taken 11/12/2021 2000 by Birdie Hopes, RN  Safety Interventions:   fall reduction program maintained   lighting adjusted for tasks/safety   low bed   nonskid shoes/slippers when out of bed  Intervention: Prevent Skin Injury  Recent Flowsheet Documentation  Taken 11/12/2021 2232 by Birdie Hopes, RN  Skin Protection: incontinence pads utilized  Taken 11/12/2021 2000 by Birdie Hopes, RN  Skin Protection: incontinence pads utilized

## 2021-11-13 NOTE — Unmapped (Addendum)
A&O x4. VSS. Patient has c/o pain once this shift and was given PRN oxycodone and tylenol. Patient then expressed pain had not gone away. Messaged doctor and she ordered another one time dose of oxycodone. On re-evaluation of pain, patient was sleeping. Took all meds without issues. Bed is in the lowest position and call bell is within reach.     Patient had a low blood glucose at 64. Gave patient two Orange juice boxes. Held insulin and moved nutritional to 1800. Patient has not ordered dinner yet.      Problem: Adult Inpatient Plan of Care  Goal: Absence of Hospital-Acquired Illness or Injury  Outcome: Progressing  Intervention: Identify and Manage Fall Risk  Recent Flowsheet Documentation  Taken 11/13/2021 0800 by Jaquelyn Bitter, RN  Safety Interventions:  ??? fall reduction program maintained  ??? low bed  ??? nonskid shoes/slippers when out of bed  ??? isolation precautions     Problem: Infection  Goal: Absence of Infection Signs and Symptoms  Outcome: Progressing  Intervention: Prevent or Manage Infection  Recent Flowsheet Documentation  Taken 11/13/2021 0800 by Jaquelyn Bitter, RN  Isolation Precautions: contact precautions maintained

## 2021-11-13 NOTE — Unmapped (Addendum)
Problem: Adult Inpatient Plan of Care  Goal: Plan of Care Review  Outcome: Progressing     Problem: Airway Clearance Ineffective  Goal: Effective Airway Clearance  Outcome: Progressing     Patient remains on RA, scheduled inhaled medications administered. No adverse reaction noted. Pt self administers home chest vest for airway clearance. No apparent distress noted.

## 2021-11-13 NOTE — Unmapped (Signed)
Hospital Medicine Daily Progress Note    Assessment/Plan:    Principal Problem:    Cystic fibrosis with pulmonary exacerbation (CMS-HCC)  Active Problems:    Essential hypertension    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)    Pancreatic insufficiency due to cystic fibrosis (CMS-HCC)    Chronic deep vein thrombosis (DVT) of lower extremity (CMS-HCC)    Long term current use of anticoagulant therapy  Resolved Problems:    * No resolved hospital problems. *                 Christian Young is a 36 y.o. male with history of history of cystic fibrosis (genetics [phone] and [phone] insertion) on Trikafta complicated by CFRD, pancreatic exocrine insufficiency that presented to [facility] with Cystic fibrosis with pulmonary exacerbation (CMS-HCC).    Cystic fibrosis with acute bronchiectasis exacerbation: presented with increased cough with sputum production, fatigue, dyspnea. Clinically improving on current antibiotic regimen.   - Appreciate Pulmonology recommendations  - Continue IV Zosyn, IV colistin, and PO levofloxacin x 13-14 days (3/24-11/16/21); patient prefers to complete abx course inpatient but requesting to discharge 4/5 due to lack of transportation on 4/6  - Continue home Trikafta  - Continue airway clearance with albuterol, 7% HTS, chest vest QID and dornase daily  - Continue Breo inhaler  - Check vitamin A, D, E, K levels at end of hospitalization (due 11/2021)    Elevated INR: INR 2.06 on 3/25. May have been due to relative vitamin K deficiency in setting of CF, but could also be elevated due to Xarelto. S/p vitamin K x 5 days, with repeat INR 1.17.   - Complete vitamin K 5 mg PO daily x 5 days (3/30-4/3)    CF-related diabetes: HbA1c 10.6 on 11/03/21. Uses insulin pump at home, unable to accommodate while inpatient.  - Lantus 45 units at bedtime  - Lispro 30 units TID AC  - SSI ACHS    CF-related pancreatic exocrine insufficiency: continue pancreatic enzymes    Chest pain related to coughing: typically requests oxycodone when hospitalized for pain related to coughing. Also has history of chronic back pain. Has been counseled that he will not be prescribed opioids on discharge.  - Oxycodone 5 mg QID prn, would not increase unless objective/clinical indication to do so    History of DVT: continue Xarelto    HTN: lisinopril    FEN: High protein, high calorie diet  VTE ppx: therapeutic Xarelto  Code status: Full code  Disposition: anticipate discharge home 11/15/21 after PM Zosyn    I personally spent 25 minutes face-to-face and non-face-to-face in the care of this patient, which includes all pre, intra, and post visit time on the date of service.  All documented time was specific to the E/M visit and does not include any procedures that may have been performed.    ___________________________________________________________________    Subjective:  No acute events overnight. Feels great, like breathing is essentially back to normal. Minimal cough or sputum production, no hemoptysis. Denies chest pain. Had some severe pain in his left arm hematoma/IV extravasation site requiring extra dose of IV Dilaudid last night, but better today, and feels like swelling is going down. Having regular BMs.     Labs/Studies:  No Labs or Studies today    Objective:  Temp:  [36.3 ??C (97.4 ??F)-36.6 ??C (97.9 ??F)] 36.3 ??C (97.4 ??F)  Heart Rate:  [90-103] 90  Resp:  [18-19] 18  BP: (122-137)/(77-92) 122/83  SpO2:  [  98 %-100 %] 100 %    Gen: Ambulating independently in room, wearing chest vest, in NAD  Eyes: EOMI, conjunctivae clear  ENT: MMM  Pulm: speaking in normal sentences, normal WOB on RA.   Abd: Soft, NTND, +BS  Ext: WWP. Mild diffuse edema in LUE forearm, interval improvement, with resolving overlying ecchymosis.   Skin: No rashes or lesions  Neuro: A&O x 3, no focal deficits  Psych: Appropriate affect

## 2021-11-14 MED ADMIN — oxyCODONE (ROXICODONE) immediate release tablet 5 mg: 5 mg | ORAL | @ 17:00:00 | Stop: 2021-11-14

## 2021-11-14 MED ADMIN — gabapentin (NEURONTIN) capsule 600 mg: 600 mg | ORAL | @ 14:00:00

## 2021-11-14 MED ADMIN — piperacillin-tazobactam (ZOSYN) IVPB (premix) 4.5 g: 4.5 g | INTRAVENOUS | @ 20:00:00 | Stop: 2021-11-16

## 2021-11-14 MED ADMIN — pancrelipase (Lip-Prot-Amyl) (CREON) 24,000-76,000 -120,000 unit delayed release capsule 288,000 units of lipase: 12 | ORAL | @ 03:00:00

## 2021-11-14 MED ADMIN — sodium chloride 7% nebulizer solution 4 mL: 4 mL | RESPIRATORY_TRACT | @ 21:00:00

## 2021-11-14 MED ADMIN — insulin lispro (HumaLOG) injection 0-20 Units: 0-20 [IU] | SUBCUTANEOUS | @ 02:00:00

## 2021-11-14 MED ADMIN — piperacillin-tazobactam (ZOSYN) IVPB (premix) 4.5 g: 4.5 g | INTRAVENOUS | @ 16:00:00 | Stop: 2021-11-16

## 2021-11-14 MED ADMIN — dornase alfa (PULMOZYME) 1 mg/mL solution 2.5 mg: 2.5 mg | RESPIRATORY_TRACT | @ 14:00:00

## 2021-11-14 MED ADMIN — montelukast (SINGULAIR) tablet 10 mg: 10 mg | ORAL | @ 02:00:00

## 2021-11-14 MED ADMIN — acetaminophen (TYLENOL) tablet 650 mg: 650 mg | ORAL | @ 17:00:00

## 2021-11-14 MED ADMIN — insulin lispro (HumaLOG) injection 15 Units: 15 [IU] | SUBCUTANEOUS | @ 04:00:00 | Stop: 2021-11-14

## 2021-11-14 MED ADMIN — piperacillin-tazobactam (ZOSYN) IVPB (premix) 4.5 g: 4.5 g | INTRAVENOUS | @ 03:00:00 | Stop: 2021-11-16

## 2021-11-14 MED ADMIN — pancrelipase (Lip-Prot-Amyl) (CREON) 24,000-76,000 -120,000 unit delayed release capsule 288,000 units of lipase: 12 | ORAL | @ 23:00:00

## 2021-11-14 MED ADMIN — lisinopriL (PRINIVIL,ZESTRIL) tablet 10 mg: 10 mg | ORAL | @ 14:00:00

## 2021-11-14 MED ADMIN — insulin lispro (HumaLOG) injection 20 Units: 20 [IU] | SUBCUTANEOUS | @ 18:00:00

## 2021-11-14 MED ADMIN — FLUoxetine (PROzac) capsule 60 mg: 60 mg | ORAL | @ 14:00:00

## 2021-11-14 MED ADMIN — fluticasone propionate (FLONASE) 50 mcg/actuation nasal spray 2 spray: 2 | NASAL | @ 14:00:00

## 2021-11-14 MED ADMIN — lamoTRIgine (LaMICtal) tablet 400 mg: 400 mg | ORAL | @ 14:00:00

## 2021-11-14 MED ADMIN — pantoprazole (PROTONIX) EC tablet 40 mg: 40 mg | ORAL | @ 02:00:00

## 2021-11-14 MED ADMIN — pantoprazole (PROTONIX) EC tablet 40 mg: 40 mg | ORAL | @ 14:00:00

## 2021-11-14 MED ADMIN — elexacaftor-tezacaftor-ivacaft (TRIKAFTA) tablet 1 tablet **Patient Supplied**: 1 | ORAL | @ 02:00:00

## 2021-11-14 MED ADMIN — colistimethate (COLYMYCIN) 150 mg in sodium chloride (NS) 4 mL inhalation: 150 mg | RESPIRATORY_TRACT | @ 14:00:00

## 2021-11-14 MED ADMIN — albuterol 2.5 mg /3 mL (0.083 %) nebulizer solution 2.5 mg: 2.5 mg | RESPIRATORY_TRACT | @ 21:00:00

## 2021-11-14 MED ADMIN — insulin lispro (HumaLOG) injection 30 Units: 30 [IU] | SUBCUTANEOUS | @ 14:00:00 | Stop: 2021-11-14

## 2021-11-14 MED ADMIN — insulin lispro (HumaLOG) injection 20 Units: 20 [IU] | SUBCUTANEOUS | @ 23:00:00

## 2021-11-14 MED ADMIN — fluticasone furoate-vilanteroL (BREO ELLIPTA) 200-25 mcg/dose inhaler 1 puff: 1 | RESPIRATORY_TRACT | @ 14:00:00

## 2021-11-14 MED ADMIN — ketorolac (TORADOL) injection 15 mg: 15 mg | INTRAVENOUS | @ 03:00:00 | Stop: 2021-11-14

## 2021-11-14 MED ADMIN — pramipexole (MIRAPEX) tablet 0.25 mg: .25 mg | ORAL | @ 02:00:00

## 2021-11-14 MED ADMIN — traZODone (DESYREL) tablet 150 mg: 150 mg | ORAL | @ 02:00:00

## 2021-11-14 MED ADMIN — gabapentin (NEURONTIN) capsule 600 mg: 600 mg | ORAL | @ 02:00:00

## 2021-11-14 MED ADMIN — gabapentin (NEURONTIN) capsule 600 mg: 600 mg | ORAL | @ 17:00:00

## 2021-11-14 MED ADMIN — atorvastatin (LIPITOR) tablet 40 mg: 40 mg | ORAL | @ 02:00:00

## 2021-11-14 MED ADMIN — pancrelipase (Lip-Prot-Amyl) (CREON) 24,000-76,000 -120,000 unit delayed release capsule 24,000 units of lipase: 1 | ORAL | @ 02:00:00 | Stop: 2021-11-13

## 2021-11-14 MED ADMIN — elexacaftor-tezacaftor-ivacaft (TRIKAFTA) tablet 2 tablet  **Patient Supplied**: 2 | ORAL | @ 14:00:00

## 2021-11-14 MED ADMIN — levoFLOXacin (LEVAQUIN) tablet 750 mg: 750 mg | ORAL | @ 14:00:00 | Stop: 2021-11-19

## 2021-11-14 MED ADMIN — rivaroxaban (XARELTO) tablet 20 mg: 20 mg | ORAL | @ 14:00:00

## 2021-11-14 MED ADMIN — pancrelipase (Lip-Prot-Amyl) (CREON) 24,000-76,000 -120,000 unit delayed release capsule 288,000 units of lipase: 12 | ORAL | @ 17:00:00

## 2021-11-14 MED ADMIN — pediatric multivitamin-vit D3 1,500 unit-vit K 800 mcg (MVW COMPLETE FORMULATION) capsule: 2 | ORAL | @ 16:00:00

## 2021-11-14 MED ADMIN — piperacillin-tazobactam (ZOSYN) IVPB (premix) 4.5 g: 4.5 g | INTRAVENOUS | @ 11:00:00 | Stop: 2021-11-16

## 2021-11-14 MED ADMIN — ketorolac (TORADOL) injection 15 mg: 15 mg | INTRAVENOUS | @ 14:00:00 | Stop: 2021-11-14

## 2021-11-14 MED ADMIN — albuterol 2.5 mg /3 mL (0.083 %) nebulizer solution 2.5 mg: 2.5 mg | RESPIRATORY_TRACT | @ 14:00:00

## 2021-11-14 MED ADMIN — amitriptyline (ELAVIL) tablet 100 mg: 100 mg | ORAL | @ 02:00:00

## 2021-11-14 MED ADMIN — pancrelipase (Lip-Prot-Amyl) (CREON) 24,000-76,000 -120,000 unit delayed release capsule 288,000 units of lipase: 12 | ORAL | @ 14:00:00

## 2021-11-14 MED ADMIN — albuterol 2.5 mg /3 mL (0.083 %) nebulizer solution 2.5 mg: 2.5 mg | RESPIRATORY_TRACT | @ 19:00:00

## 2021-11-14 MED ADMIN — cetirizine (ZyrTEC) tablet 10 mg: 10 mg | ORAL | @ 14:00:00

## 2021-11-14 MED ADMIN — sodium chloride 7% nebulizer solution 4 mL: 4 mL | RESPIRATORY_TRACT | @ 14:00:00

## 2021-11-14 MED ADMIN — sodium chloride 7% nebulizer solution 4 mL: 4 mL | RESPIRATORY_TRACT | @ 19:00:00

## 2021-11-14 MED ADMIN — insulin glargine (LANTUS) injection 45 Units: 45 [IU] | SUBCUTANEOUS | @ 01:00:00

## 2021-11-14 NOTE — Unmapped (Signed)
Pt A&O X4. VSS, pt c/o of pain at port access site, toradol given per MD order. Port accidentally deaccessed per pt, MD notified. ER RN reaccessed port. Pt tolerated meds well, BGC taken, MD notified. Meds given per MD order. Falls precautions maintained, call bell within reach. Bed is low and locked.      Problem: Adult Inpatient Plan of Care  Goal: Plan of Care Review  11/14/2021 0516 by Birdie Hopes, RN  Outcome: Ongoing - Unchanged  11/14/2021 0515 by Birdie Hopes, RN  Outcome: Ongoing - Unchanged  11/14/2021 0505 by Birdie Hopes, RN  Outcome: Ongoing - Unchanged  Goal: Optimal Comfort and Wellbeing  11/14/2021 0516 by Birdie Hopes, RN  Outcome: Ongoing - Unchanged  11/14/2021 0515 by Birdie Hopes, RN  Outcome: Ongoing - Unchanged  11/14/2021 0505 by Birdie Hopes, RN  Outcome: Ongoing - Unchanged  Goal: Readiness for Transition of Care  11/14/2021 0516 by Birdie Hopes, RN  Outcome: Ongoing - Unchanged  11/14/2021 0515 by Birdie Hopes, RN  Outcome: Ongoing - Unchanged     Problem: Infection  Goal: Absence of Infection Signs and Symptoms  11/14/2021 0516 by Birdie Hopes, RN  Outcome: Ongoing - Unchanged  11/14/2021 0505 by Birdie Hopes, RN  Outcome: Ongoing - Unchanged  Intervention: Prevent or Manage Infection  Recent Flowsheet Documentation  Taken 11/13/2021 2000 by Birdie Hopes, RN  Isolation Precautions: contact precautions maintained     Problem: Diabetes Comorbidity  Goal: Blood Glucose Level Within Targeted Range  11/14/2021 0516 by Birdie Hopes, RN  Outcome: Ongoing - Unchanged  11/14/2021 0505 by Birdie Hopes, RN  Outcome: Ongoing - Unchanged     Problem: Pain Acute  Goal: Acceptable Pain Control and Functional Ability  11/14/2021 0516 by Birdie Hopes, RN  Outcome: Ongoing - Unchanged  11/14/2021 0505 by Birdie Hopes, RN  Outcome: Ongoing - Unchanged     Problem: Adult Inpatient Plan of Care  Goal: Absence of Hospital-Acquired Illness or Injury  Intervention: Identify and Manage Fall Risk  Recent Flowsheet Documentation  Taken 11/13/2021 2000 by Birdie Hopes, RN  Safety Interventions:   fall reduction program maintained   low bed  Intervention: Prevent Skin Injury  Recent Flowsheet Documentation  Taken 11/13/2021 2247 by Birdie Hopes, RN  Skin Protection: incontinence pads utilized  Taken 11/13/2021 2000 by Birdie Hopes, RN  Skin Protection: incontinence pads utilized

## 2021-11-14 NOTE — Unmapped (Signed)
Hospital Medicine Daily Progress Note    Assessment/Plan:    Principal Problem:    Cystic fibrosis with pulmonary exacerbation (CMS-HCC)  Active Problems:    Essential hypertension    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)    Pancreatic insufficiency due to cystic fibrosis (CMS-HCC)    Chronic deep vein thrombosis (DVT) of lower extremity (CMS-HCC)    Long term current use of anticoagulant therapy  Resolved Problems:    * No resolved hospital problems. *                 Christian Young is a 36 y.o. male with history of history of cystic fibrosis (genetics [phone] and [phone] insertion) on Trikafta complicated by CFRD, pancreatic exocrine insufficiency that presented to [facility] with Cystic fibrosis with pulmonary exacerbation (CMS-HCC).    Cystic fibrosis with acute bronchiectasis exacerbation, improving: presented with increased cough with sputum production, fatigue, dyspnea. Clinically improving on current antibiotic regimen.   - Appreciate Pulmonology recommendations  - Continue IV Zosyn, IV colistin, and PO levofloxacin x 13 days (3/24-11/15/21); patient prefers to complete abx course inpatient but requesting to discharge 4/5 due to lack of transportation on 4/6  - Continue home Trikafta  - Continue airway clearance with albuterol, 7% HTS, chest vest QID and dornase daily  - Continue Breo inhaler  - Check vitamin A, D, E, K levels tomorrow    CF-related diabetes: HbA1c 10.6 on 11/03/21. Uses insulin pump at home, unable to accommodate while inpatient. Having intermittent hypoglycemia last 36 hours, possibly due to insulin stacking from eating meals close together and receiving multiple doses of nutritional insulin. Has not had any fasting hypoglycemia.   - Lantus 45 units at bedtime  - Decrease Lispro to 20 units TID AC  - SSI ACHS  - Resume insulin pump at home    CF-related pancreatic exocrine insufficiency: continue pancreatic enzymes    Chest pain related to coughing: typically requests oxycodone when hospitalized for pain related to coughing. Also has history of chronic back pain. Has been counseled that he will not be prescribed opioids on discharge.  - Oxycodone 5 mg QID prn, would not increase unless objective/clinical indication to do so    History of DVT: continue Xarelto    HTN: lisinopril    Elevated INR, resolved: INR 2.06 on 3/25. May have been due to relative vitamin K deficiency in setting of CF, but could also be elevated due to Xarelto. S/p vitamin K x 5 days, with repeat INR 1.17.     FEN: High protein, high calorie diet  VTE ppx: therapeutic Xarelto  Code status: Full code  Disposition: anticipate discharge home 11/15/21 after PM dose of Zosyn    I personally spent 25 minutes face-to-face and non-face-to-face in the care of this patient, which includes all pre, intra, and post visit time on the date of service.  All documented time was specific to the E/M visit and does not include any procedures that may have been performed.    ___________________________________________________________________    Subjective:  No acute events overnight. Breathing is essentially back to normal. Minimal cough, no hemoptysis. Eating and drinking well. Nursing notes he tends to eat meals close together so has been getting his mealtime insulin stacked within 3 hrs, which has caused some symptomatic hypoglycemia. He feels more comfortable using his insulin pump at home. He accidentally stepped on his IV line yesterday which caused his port to become de-accessed. He is having some pain in the  port site where it was re-accessed and requests extra pain medication to take the edge off. He feels ready to go home tomorrow.      Labs/Studies:  No Labs or Studies today    Objective:  Temp:  [35.6 ??C (96.1 ??F)-36 ??C (96.8 ??F)] 35.6 ??C (96.1 ??F)  Heart Rate:  [87-108] 107  Resp:  [16-17] 17  BP: (131-133)/(79-85) 133/79  SpO2:  [97 %-98 %] 97 %    Gen: Sitting up in chair playing video game on TV  Eyes: EOMI, conjunctivae clear  ENT: MMM. Port accessed on left anterior chest wall with some overlying erythema and tenderness.   Pulm: speaking in normal sentences, normal WOB on RA, CTAB without wheezes or crackles.   Abd: Soft, NTND  Ext: WWP. Mild diffuse edema in LUE forearm with evolving ecchymosis  Skin: No rashes or lesions  Neuro: A&O x 3, no focal deficits  Psych: Appropriate affect

## 2021-11-14 NOTE — Unmapped (Signed)
VSS. Medicated for pain x 2. Tolerated IV antibiotics well.   Problem: Adult Inpatient Plan of Care  Goal: Plan of Care Review  Outcome: Ongoing - Unchanged  Goal: Patient-Specific Goal (Individualized)  Outcome: Ongoing - Unchanged  Goal: Absence of Hospital-Acquired Illness or Injury  Outcome: Ongoing - Unchanged  Goal: Optimal Comfort and Wellbeing  Outcome: Ongoing - Unchanged  Goal: Readiness for Transition of Care  Outcome: Ongoing - Unchanged  Goal: Rounds/Family Conference  Outcome: Ongoing - Unchanged     Problem: Infection  Goal: Absence of Infection Signs and Symptoms  Outcome: Ongoing - Unchanged     Problem: Airway Clearance Ineffective  Goal: Effective Airway Clearance  Outcome: Ongoing - Unchanged     Problem: Diabetes Comorbidity  Goal: Blood Glucose Level Within Targeted Range  Outcome: Ongoing - Unchanged     Problem: Pain Acute  Goal: Acceptable Pain Control and Functional Ability  Outcome: Ongoing - Unchanged

## 2021-11-14 NOTE — Unmapped (Signed)
Pt received scheduled treatments, tolerated well.  Airway clearance done with pt's home vest.  Stable on RA.  Reports secretions are back to baseline.  BBS clear, slightly coarse in the bases, but good air movement throughout.

## 2021-11-14 NOTE — Unmapped (Signed)
PULMONARY CONSULT  NOTE      Patient: Christian Young([birth year])  Reason for consultation: Christian Young is a 36 y.o. male who is seen in consultation at the request of Arman Filter, * for comprehensive evaluation of Cystic Fibrosis with acute exacerbation.    Assessment and Recommendations:      Principal Problem:    Cystic fibrosis with pulmonary exacerbation (CMS-HCC)  Active Problems:    Essential hypertension    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)    Pancreatic insufficiency due to cystic fibrosis (CMS-HCC)    Chronic deep vein thrombosis (DVT) of lower extremity (CMS-HCC)    Long term current use of anticoagulant therapy  Resolved Problems:    * No resolved hospital problems. Christian Young is a 36 y.o. year old male with a history of cystic fibrosis (genetics [phone] and [phone] insertion) on Trikafta presenting for acute exacerbation of bronchiectasis.     Cystic Fibrosis with pulmonary manifestation with  acute exacerbation of bronchiectasis: Presentation consistent with a CF pulmonary exacerbation as characterized by cough, fatigue, increased SOB and increased sputum production.   - Piperacillin-tazobactam 4.5g IV q6hrs and levofloxacin 750mg  PO daily.  - Also continuing inhaled colistin as new suppressive regimen.  - Airway clearance with QID vest, albuterol, 7% HTS, Pulmozyme.  Encouraged him to use his percussive vest 4x/day with nebs while hospitalized and to notify RT if the use so that is documented in his chart.  - Continue Breo daily.  - Continue Trikafta from patient home supply.  - PFTs at baseline, no need to repeat  - Vitamin K 5 mg daily for 5 days because elevated INR. Recheck before discharge.    - Twice weekly labs sufficient on this current antibiotics course.   - For access, patient has Implanted Port, now being used, no issues thus far.  - As noted previously, patient does not want to return home for duration of his treatment course, wishes to complete inpatient and discharge home 4/5 after 3 PM Zosyn dose    Cystic Fibrosis with GI manifestations with  CF-related intestinal malabsorption (JXB:JYNW mass index is 32.34 kg/m??.)  - High-calorie, high-protein diet with double portions.   - Monitor with weights twice weekly.  - Continue Creon and MVW vitamins during admission. Corrected vitamins to 2 tabs daily.  - due to check Vitamin A, D, E levels in 11/2021 - please obtain with labs at end of exacerbation course     Cystic Fibrosis with GI manifestations with  CF-related  diabetes  - monitor blood sugars frequently, glargine and lispro as ordered.    We appreciate the opportunity to assist in the care of this patient.  Please page [phone] with any questions.      Subjective:      History of Present Illness:  Christian Young is a 36 y.o. male admitted for CF with acute exacerbation.    He presents with his usual symptoms of cough, increased sputum, and SOB. He has had several exacerbations similar to this and is well known to the pulmonology service (see note by Dr. [name]).    Interval history 11/14/21: No events overnight. Only complaint this morning is of pain at his port site.     Review of Systems: A comprehensive review of systems was performed and was negative except as above in HPI    Past medical, past surgical, family, and social histories reviewed from initial consult note and otherwise unchanged.    Medications  reviewed in Epic.  Antibiotics with Zosyn and PO levofloxacin.    Allergies as of 11/03/2021 - Reviewed 11/03/2021   Allergen Reaction Noted   ??? Aztreonam Anaphylaxis, Hives, Nausea And Vomiting, and Rash 09/06/2015   ??? [place] [aztreonam lysine] Anaphylaxis 12/27/2016   ??? Cefepime Itching, Nausea Only, and Other (See Comments) 09/06/2015   ??? Other Anaphylaxis and Other (See Comments) 09/06/2015   ??? Slo-bid 100 Anaphylaxis 06/20/2017   ??? Tobramycin Tinnitus 09/06/2015   ??? Banana Itching and Nausea And Vomiting 07/29/2016        Objective: Physical Exam:  Vitals:    11/13/21 1519 11/13/21 1944 11/13/21 2102 11/14/21 1000   BP: 102/56  131/85 133/79   Pulse: 101 108 87 107   Resp: 18 16 17     Temp: 36.4 ??C (97.5 ??F)  36 ??C (96.8 ??F) 35.6 ??C (96.1 ??F)   TempSrc: Temporal  Oral    SpO2: 96% 98% 98% 97%   Weight:       Height:         GEN: NAD, comfortable  EYES: EOMI, sclera anicteric  ENT: Trachea midline, MMM  CV: RRR, no murmurs appreciated  PULM: Clear to ausculation bilaterally with good air movement.  Easy work of breathing without accessory muscle use.  ABD: soft, non-tender, non-distended  EXT: No edema.  NEURO: Grossly Non-focal, moving all extremities normally  PSYCH: A+Ox3, appropriate       Diagnostic Review:   All labs and images were personally reviewed.    Sputum culture 3/25: 2+ mucoid PsA, 2+ smooth PsA, 2+ OP flora.    FVL 11/07/21: FEV1 3.26 (70%), FVC 4.28 (74%), 0.76  Overall similar to last study

## 2021-11-15 LAB — COMPREHENSIVE METABOLIC PANEL
ALBUMIN: 3.5 g/dL (ref 3.4–5.0)
ALKALINE PHOSPHATASE: 67 U/L (ref 46–116)
ALT (SGPT): 39 U/L (ref 10–49)
ANION GAP: 5 mmol/L (ref 5–14)
AST (SGOT): 25 U/L (ref <=34)
BILIRUBIN TOTAL: 0.4 mg/dL (ref 0.3–1.2)
BLOOD UREA NITROGEN: 10 mg/dL (ref 9–23)
BUN / CREAT RATIO: 12
CALCIUM: 9.1 mg/dL (ref 8.7–10.4)
CHLORIDE: 103 mmol/L (ref 98–107)
CO2: 29.2 mmol/L (ref 20.0–31.0)
CREATININE: 0.81 mg/dL
EGFR CKD-EPI (2021) MALE: >90 mL/min/{1.73_m2} (ref >=60)
GLUCOSE RANDOM: 110 mg/dL (ref 70–179)
POTASSIUM: 3.9 mmol/L (ref 3.4–4.8)
PROTEIN TOTAL: 6.6 g/dL (ref 5.7–8.2)
SODIUM: 137 mmol/L (ref 135–145)

## 2021-11-15 LAB — CBC
HEMATOCRIT: 36.1 % — ABNORMAL LOW (ref 39.0–48.0)
HEMOGLOBIN: 12.2 g/dL — ABNORMAL LOW (ref 12.9–16.5)
MEAN CORPUSCULAR HEMOGLOBIN CONC: 33.7 g/dL (ref 32.0–36.0)
MEAN CORPUSCULAR HEMOGLOBIN: 26.9 pg (ref 25.9–32.4)
MEAN CORPUSCULAR VOLUME: 79.8 fL (ref 77.6–95.7)
MEAN PLATELET VOLUME: 7.3 fL (ref 6.8–10.7)
PLATELET COUNT: 260 10*9/L (ref 150–450)
RED BLOOD CELL COUNT: 4.53 10*12/L (ref 4.26–5.60)
RED CELL DISTRIBUTION WIDTH: 16.2 % — ABNORMAL HIGH (ref 12.2–15.2)
WBC ADJUSTED: 6.4 10*9/L (ref 3.6–11.2)

## 2021-11-15 MED ORDER — LEVOFLOXACIN 750 MG TABLET
ORAL_TABLET | ORAL | 0 refills | 1 days | Status: CP
Start: 2021-11-15 — End: 2021-11-16
  Filled 2021-11-15: qty 1, 1d supply, fill #0

## 2021-11-15 MED ADMIN — gabapentin (NEURONTIN) capsule 600 mg: 600 mg | ORAL | @ 13:00:00 | Stop: 2021-11-15

## 2021-11-15 MED ADMIN — pantoprazole (PROTONIX) EC tablet 40 mg: 40 mg | ORAL | @ 13:00:00 | Stop: 2021-11-15

## 2021-11-15 MED ADMIN — rivaroxaban (XARELTO) tablet 20 mg: 20 mg | ORAL | @ 13:00:00 | Stop: 2021-11-15

## 2021-11-15 MED ADMIN — insulin lispro (HumaLOG) injection 20 Units: 20 [IU] | SUBCUTANEOUS | @ 13:00:00 | Stop: 2021-11-15

## 2021-11-15 MED ADMIN — gabapentin (NEURONTIN) capsule 600 mg: 600 mg | ORAL | @ 01:00:00

## 2021-11-15 MED ADMIN — sodium chloride 7% nebulizer solution 4 mL: 4 mL | RESPIRATORY_TRACT

## 2021-11-15 MED ADMIN — piperacillin-tazobactam (ZOSYN) IVPB (premix) 4.5 g: 4.5 g | INTRAVENOUS | @ 01:00:00 | Stop: 2021-11-16

## 2021-11-15 MED ADMIN — pantoprazole (PROTONIX) EC tablet 40 mg: 40 mg | ORAL | @ 01:00:00

## 2021-11-15 MED ADMIN — atorvastatin (LIPITOR) tablet 40 mg: 40 mg | ORAL | @ 01:00:00

## 2021-11-15 MED ADMIN — fluticasone furoate-vilanteroL (BREO ELLIPTA) 200-25 mcg/dose inhaler 1 puff: 1 | RESPIRATORY_TRACT | @ 13:00:00 | Stop: 2021-11-15

## 2021-11-15 MED ADMIN — colistimethate (COLYMYCIN) 150 mg in sodium chloride (NS) 4 mL inhalation: 150 mg | RESPIRATORY_TRACT

## 2021-11-15 MED ADMIN — piperacillin-tazobactam (ZOSYN) IVPB (premix) 4.5 g: 4.5 g | INTRAVENOUS | @ 07:00:00 | Stop: 2021-11-15

## 2021-11-15 MED ADMIN — albuterol 2.5 mg /3 mL (0.083 %) nebulizer solution 2.5 mg: 2.5 mg | RESPIRATORY_TRACT

## 2021-11-15 MED ADMIN — colistimethate (COLYMYCIN) 150 mg in sodium chloride (NS) 4 mL inhalation: 150 mg | RESPIRATORY_TRACT | @ 13:00:00 | Stop: 2021-11-15

## 2021-11-15 MED ADMIN — pramipexole (MIRAPEX) tablet 0.25 mg: .25 mg | ORAL | @ 01:00:00

## 2021-11-15 MED ADMIN — cetirizine (ZyrTEC) tablet 10 mg: 10 mg | ORAL | @ 13:00:00 | Stop: 2021-11-15

## 2021-11-15 MED ADMIN — insulin glargine (LANTUS) injection 45 Units: 45 [IU] | SUBCUTANEOUS | @ 01:00:00

## 2021-11-15 MED ADMIN — sodium chloride 7% nebulizer solution 4 mL: 4 mL | RESPIRATORY_TRACT | @ 13:00:00 | Stop: 2021-11-15

## 2021-11-15 MED ADMIN — elexacaftor-tezacaftor-ivacaft (TRIKAFTA) tablet 1 tablet **Patient Supplied**: 1 | ORAL | @ 01:00:00

## 2021-11-15 MED ADMIN — lamoTRIgine (LaMICtal) tablet 400 mg: 400 mg | ORAL | @ 12:00:00 | Stop: 2021-11-15

## 2021-11-15 MED ADMIN — heparin, porcine (PF) 100 unit/mL injection 300 Units: 300 [IU] | INTRAVENOUS | @ 13:00:00 | Stop: 2021-11-15

## 2021-11-15 MED ADMIN — elexacaftor-tezacaftor-ivacaft (TRIKAFTA) tablet 2 tablet  **Patient Supplied**: 2 | ORAL | @ 13:00:00 | Stop: 2021-11-15

## 2021-11-15 MED ADMIN — FLUoxetine (PROzac) capsule 60 mg: 60 mg | ORAL | @ 13:00:00 | Stop: 2021-11-15

## 2021-11-15 MED ADMIN — montelukast (SINGULAIR) tablet 10 mg: 10 mg | ORAL | @ 01:00:00

## 2021-11-15 MED ADMIN — dornase alfa (PULMOZYME) 1 mg/mL solution 2.5 mg: 2.5 mg | RESPIRATORY_TRACT | @ 13:00:00 | Stop: 2021-11-15

## 2021-11-15 MED ADMIN — amitriptyline (ELAVIL) tablet 100 mg: 100 mg | ORAL | @ 01:00:00

## 2021-11-15 MED ADMIN — fluticasone propionate (FLONASE) 50 mcg/actuation nasal spray 2 spray: 2 | NASAL | @ 13:00:00 | Stop: 2021-11-15

## 2021-11-15 MED ADMIN — oxyCODONE (ROXICODONE) immediate release tablet 5 mg: 5 mg | ORAL | @ 01:00:00 | Stop: 2021-11-17

## 2021-11-15 MED ADMIN — pancrelipase (Lip-Prot-Amyl) (CREON) 24,000-76,000 -120,000 unit delayed release capsule 288,000 units of lipase: 12 | ORAL | @ 13:00:00 | Stop: 2021-11-15

## 2021-11-15 MED ADMIN — piperacillin-tazobactam (ZOSYN) IVPB (premix) 4.5 g: 4.5 g | INTRAVENOUS | @ 12:00:00 | Stop: 2021-11-15

## 2021-11-15 MED ADMIN — levoFLOXacin (LEVAQUIN) tablet 750 mg: 750 mg | ORAL | @ 13:00:00 | Stop: 2021-11-15

## 2021-11-15 MED ADMIN — traZODone (DESYREL) tablet 150 mg: 150 mg | ORAL | @ 01:00:00

## 2021-11-15 MED ADMIN — albuterol 2.5 mg /3 mL (0.083 %) nebulizer solution 2.5 mg: 2.5 mg | RESPIRATORY_TRACT | @ 13:00:00 | Stop: 2021-11-15

## 2021-11-15 MED ADMIN — lisinopriL (PRINIVIL,ZESTRIL) tablet 10 mg: 10 mg | ORAL | @ 13:00:00 | Stop: 2021-11-15

## 2021-11-15 NOTE — Unmapped (Signed)
Physician Discharge Summary [facility]  [facility] BT[facility]  [address]  [place] [address]  Dept: [phone]  Loc: [phone]     Identifying Information:   Christian Young  1986/02/26  [id number]    Primary Care Physician: No primary care provider on file.   Code Status: Full Code    Admit Date: 11/03/2021    Discharge Date: 11/15/2021     Discharge To: Home    Discharge Service: [facility] - HBC: Hospitalist Service #2     Discharge Attending Physician: Arman Filter, MD    Discharge Diagnoses:  Principal Problem:    Cystic fibrosis with pulmonary exacerbation (CMS-HCC) POA: Yes  Active Problems:    Essential hypertension POA: Yes    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) POA: Yes    Pancreatic insufficiency due to cystic fibrosis (CMS-HCC) POA: Yes    Chronic deep vein thrombosis (DVT) of lower extremity (CMS-HCC) POA: Yes    Long term current use of anticoagulant therapy POA: Not Applicable  Resolved Problems:    * No resolved hospital problems. *      Outpatient Provider Follow Up Issues:   [  ] continue to titrate insulin pump  [  ] follow up vitamins A, D, E, K levels (pending at discharge)    Hospital Course:   Christian Young is a 36 y.o. male with history of history of cystic fibrosis (genetics [phone] and [phone] insertion) on Trikafta complicated by CFRD, pancreatic exocrine insufficiency that presented to [facility] with Cystic fibrosis with pulmonary exacerbation (CMS-HCC).    Cystic fibrosis with acute bronchiectasis exacerbation: presented with increased cough with sputum production, fatigue, dyspnea. Respiratory pathogen panel negative and sputum cultures grew 2+ smooth PsA (S-pip-tazo; R-ciprofloxacin, imipenem, levofloxacin) and 2+ mucoid PsA (S-pip-tazo; I-ciprofloxacin, levofloxacin, meropenem; R-imipenem). PFTs showed FEV1 69.7% predicted, similar to during prior exacerbations in 06/2021 and 07/2021, slightly down from 71.9% in 01/2021. Pulmonology consulted. He was treated with IV Zosyn, and PO levofloxacin for 13 days (3/24-11/15/21), one day short of two weeks due to lack of transportation on day 14. (He had declined to complete antibiotics at home as his wife had recently contracted a viral URI.) Patient continued home inhaled Colistin. Continued home Trikafta, inhalers, GI medications, vitamins, and increased home airway clearance to QID. His respiratory status was subjectively back to his baseline by time of discharge.   ??  CF-related diabetes, poorly-controlled: HbA1c 10.6 on 11/03/21. Uses insulin pump at home, which were were unfortunately unable to accommodate while inpatient. He was treated with subcutaneous insulin regimen of Lantus 45 units at bedtime, lispro 30 units TID AC, and sliding scale ACHS. He did have some post-prandial hypoglycemia most likely due to insulin stacking, as he tends to eat his afternoon/evening meals close together; this improved after decreasing lispro to 20 units TIDAC and avoiding re-dosing within 4 hours. He did not have any fasting hypoglycemia on Lantus 45 units at bedtime. He will resume his home insulin pump after discharge.     Elevated INR: Incidentally noted INR 2.06 on 3/25. May have been due to relative vitamin K deficiency in setting of CF and/or home Xarelto causing some prolonged PT. Treated with vitamin K 5 mg PO x 5 days, with repeat INR 1.17 before discharge.    ??  CF-related pancreatic exocrine insufficiency: continued pancreatic enzymes. Vitamin A, D, E, K levels were checked and pending at time of discharge.   ??  Chest pain related to coughing: typically requests  oxycodone when hospitalized for pain related to coughing in setting of CF exacebration. Also has history of chronic back pain. He was given oxycodone 5-10 mg QID prn, but this was not continued after discharge.    Chronic medical conditions:  History of DVT: continued Xarelto  HTN: continued lisinopril  Hyperlipidemia: continued atorvastatin.   Depression: continued  fluoxetine and amitriptyline    Procedures:  No admission procedures for hospital encounter.  ______________________________________________________________________  Discharge Medications:     Your Medication List      STOP taking these medications    ciprofloxacin HCl 750 MG tablet  Commonly known as: CIPRO        START taking these medications    levoFLOXacin 750 MG tablet  Commonly known as: LEVAQUIN  Take 1 tablet (750 mg total) by mouth daily for 1 day. Take last dose on 11/16/21        CHANGE how you take these medications    MVW COMPLETE FORMUL PROBIOTIC 40 billion cell -15 mg Cpdr  Generic drug: Lacto-Bif-Sac-Bacil-Strep-bact  Take 1 capsule by mouth daily.  What changed: when to take this        CONTINUE taking these medications    acetaminophen 500 MG tablet  Commonly known as: TYLENOL  Take 1 tablet (500 mg total) by mouth every six (6) hours as needed.     albuterol 90 mcg/actuation inhaler  Commonly known as: PROVENTIL HFA;VENTOLIN HFA  Inhale 2 puffs every six (6) hours as needed.     albuterol 2.5 mg /3 mL (0.083 %) nebulizer solution  Inhale 3 mL (2.5 mg total) by nebulization two (2) times a day.     ALCOHOL PREP PADS Padm  Generic drug: alcohol swabs  Use as directed with inhaled antibiotics     amitriptyline 100 MG tablet  Commonly known as: ELAVIL  TAKE ONE TABLET BY MOUTH AT BEDTIME     atorvastatin 20 MG tablet  Commonly known as: LIPITOR  Take 40 mg by mouth nightly.     BD LUER-LOK SYRINGE 3 mL 21 gauge x 1 Syrg  Generic drug: syringe with needle  For use with inhaled antibiotic     blood-glucose meter kit  Dispense meter that is preferred by patient's insurance company     budesonide-formoteroL 160-4.5 mcg/actuation inhaler  Commonly known as: SYMBICORT  Inhale 2 puffs Two (2) times a day.     cetirizine 10 MG tablet  Commonly known as: ZyrTEC  Take 1 tablet (10 mg total) by mouth daily.     colistimethate 150 mg injection  Commonly known as: COLYMYCIN  Inhale 2 mL (150 mg total) Two (2) times a day, alternating 28 days on and 28 days off. Inject 2mL SWFI into colistin vial & gently mix, then withdraw 2mL (150mg ) for dose.     DEXCOM G6 RECEIVER Misc  Generic drug: blood-glucose meter,continuous  Use as directed     DEXCOM G6 SENSOR Devi  Generic drug: blood-glucose sensor  Use sq as directed every 10 days     DEXCOM G6 TRANSMITTER Devi  Generic drug: blood-glucose transmitter  Use as directed, replace every 90 days     dornase alfa 1 mg/mL nebulizer solution  Commonly known as: PULMOZYME  Inhale 2.5 mg daily.     EASY TOUCH LANCING DEVICE Misc  Generic drug: lancing device  Use as directed.     EASY TOUCH TWIST LANCETS 30 gauge Misc  Generic drug: lancets  Use as directed.  empty container Misc  Use as directed to dispose of syringes     FLUoxetine 60 mg Tab  Take 60 mg by mouth daily.     fluticasone propionate 50 mcg/actuation nasal spray  Commonly known as: FLONASE  2 sprays into each nostril daily.     FREESTYLE LIBRE 14 DAY SENSOR kit  Generic drug: flash glucose sensor  Use 3-4 times daily     gabapentin 600 MG tablet  Commonly known as: NEURONTIN  Take 600 mg by mouth Three (3) times a day.     glucose blood Strp  Generic drug: blood sugar diagnostic  by Other route Three (3) times a day with a meal. Rx sent to [facility] 01/04/20     insulin ASPART 100 unit/mL injection  Commonly known as: NovoLOG U-100 Insulin aspart  Subcutaneously infuse up to 150 units daily via insulin pump     lamoTRIgine 200 MG tablet  Commonly known as: LaMICtal  Take 400 mg by mouth daily.     LC PLUS Misc  Generic drug: nebulizers  Use as directed with inhaled medications     LC PLUS Misc  Generic drug: nebulizers  use as directed with nebulized medications twice daily.     LC PLUS Misc  Generic drug: nebulizers  Use with inhaled medication.     lipase-protease-amylase 36,000-114,000- 180,000 unit Cpdr  Commonly known as: CREON  Take 8 caps by mouth with meals three times daily and 4 caps with snacks up to three times a day.     lisinopriL 10 MG tablet  Commonly known as: PRINIVIL,ZESTRIL  Take 1 tablet (10 mg total) by mouth daily.     melatonin 10 mg Tab  Take 10 mg by mouth nightly as needed.     montelukast 10 mg tablet  Commonly known as: SINGULAIR  Take 1 tablet (10 mg total) by mouth nightly.     MVW Complete (pediatric multivit 61-D3-vit K) 1,500-800 unit-mcg Cap  Commonly known as: MVW COMPLETE FORMULATION  Take 2 capsules by mouth Two (2) times a day.     omeprazole 20 MG capsule  Commonly known as: PriLOSEC  Take 1 capsule (20 mg total) by mouth daily.     OMNIPOD 5 G6 PODS (GEN 5) Crtg  Generic drug: insulin pump cart,automated,BT  1 each by subcutaneous (via wearable injector) route every three (3) days. Change every 72 hour     pen needle, diabetic 32 gauge x 5/32 (4 mm) Ndle  Commonly known as: BD ULTRA-FINE NANO PEN NEEDLE  use up to 4 times daily     pramipexole 0.125 MG tablet  Commonly known as: MIRAPEX  Take 0.25 mg by mouth nightly.     sodium chloride 7% 7 % Nebu  Inhale 4 mL by nebulization two (2) times a day.     sterile water Soln  Use 2 mL to mix colistin, then add another 1 mL SWFI into neb cup along with mixed colistin.     traZODone 150 MG tablet  Commonly known as: DESYREL  Take 150 mg by mouth nightly.     TRIKAFTA 100-50-75 mg(d) /150 mg (n) tablet  Generic drug: elexacaftor-tezacaftor-ivacaft  Take 2 Tablets (orange) by mouth in the morning and 1 tablet (blue) in the evening with fatty food     XARELTO 20 mg tablet  Generic drug: rivaroxaban  Take 20 mg by mouth daily.            Allergies:  Aztreonam, [place] [  aztreonam lysine], Cefepime, Other, Slo-bid 100, Tobramycin, and Banana  ______________________________________________________________________  Pending Test Results (if blank, then none):  Pending Labs     Order Current Status    Vitamin A and Vitamin E In process    Vitamin D 25 Hydroxy (25OH D2 + D3) In process    Vitamin K1 In process    AFB culture Preliminary result Comprehensive Metabolic Panel Preliminary result          Most Recent Labs:  All lab results last 24 hours -   Recent Results (from the past 24 hour(s))   POCT Glucose    Collection Time: 11/14/21  8:35 AM   Result Value Ref Range    Glucose, POC 142 70 - 179 mg/dL   POCT Glucose    Collection Time: 11/14/21 12:04 PM   Result Value Ref Range    Glucose, POC 152 70 - 179 mg/dL   POCT Glucose    Collection Time: 11/14/21  5:22 PM   Result Value Ref Range    Glucose, POC 127 70 - 179 mg/dL   POCT Glucose    Collection Time: 11/14/21  8:27 PM   Result Value Ref Range    Glucose, POC 166 70 - 179 mg/dL   Comprehensive Metabolic Panel    Collection Time: 11/15/21  6:19 AM   Result Value Ref Range    Sodium 137 135 - 145 mmol/L    Potassium 3.9 3.4 - 4.8 mmol/L    Chloride 103 98 - 107 mmol/L    CO2 29.2 20.0 - 31.0 mmol/L    Anion Gap 5 5 - 14 mmol/L    BUN 10 9 - 23 mg/dL    Creatinine 5.78 4.69 - 1.10 mg/dL    BUN/Creatinine Ratio 12     eGFR CKD-EPI (2021) Male >90 >=60 mL/min/1.78m2    Glucose 110 70 - 179 mg/dL    Calcium 9.1 8.7 - 62.9 mg/dL    Albumin 3.5 3.4 - 5.0 g/dL    Total Protein      Total Bilirubin 0.4 0.3 - 1.2 mg/dL    AST 25 <=52 U/L    ALT 39 10 - 49 U/L    Alkaline Phosphatase 67 46 - 116 U/L   CBC    Collection Time: 11/15/21  6:19 AM   Result Value Ref Range    WBC 6.4 3.6 - 11.2 10*9/L    RBC 4.53 4.26 - 5.60 10*12/L    HGB 12.2 (L) 12.9 - 16.5 g/dL    HCT 84.1 (L) 32.4 - 48.0 %    MCV 79.8 77.6 - 95.7 fL    MCH 26.9 25.9 - 32.4 pg    MCHC 33.7 32.0 - 36.0 g/dL    RDW 40.1 (H) 02.7 - 15.2 %    MPV 7.3 6.8 - 10.7 fL    Platelet 260 150 - 450 10*9/L       Relevant Studies/Radiology (if blank, then none):  ECG 12 Lead    Result Date: 11/03/2021  NORMAL SINUS RHYTHM NORMAL ECG WHEN COMPARED WITH ECG OF 08-Sep-2021 16:27, NO SIGNIFICANT CHANGE WAS FOUND Confirmed by Christella Noa ([id number]) on 11/03/2021 3:04:31 PM    XR Chest 2 views    Result Date: 11/03/2021  EXAM: XR CHEST 2 VIEWS DATE: 11/03/2021 2:36 PM ACCESSION: [id number] UN DICTATED: 11/03/2021 2:45 PM INTERPRETATION LOCATION: [facility] CLINICAL INDICATION: 36 years old Male with COUGH  TECHNIQUE: PA and Lateral Chest Radiographs. COMPARISON: 09/08/2021 FINDINGS: Left chest wall  Port-A-Cath with its tip in distal SVC. Stable linear airspace opacities in right mid and lower lung, consistent with atelectasis/scarring (better demonstrated on recently performed chest CT). No new consolidation. No pleural effusion. No pneumothorax. Normal cardiomediastinal silhouette. Stable mild elevation of right hemidiaphragm.     No new acute findings.    ______________________________________________________________________  Discharge Instructions:     Diet Instructions     Discharge diet (specify)      Discharge Nutrition Therapy: High Calorie High Protein          Follow Up instructions and Outpatient Referrals     Call MD for:  difficulty breathing, headache or visual disturbances      Call MD for:  extreme fatigue      Call MD for:  persistent dizziness or light-headedness      Discharge instructions          Other Instructions     Call MD for:  difficulty breathing, headache or visual disturbances      Call MD for:  extreme fatigue      Call MD for:  persistent dizziness or light-headedness      Discharge instructions      You were admitted to the hospital with a cystic fibrosis exacerbation. You were treated with IV antibiotics for two weeks. With this treatment, your breathing improved back to your baseline. You will need to take one more dose of an oral antibiotic called levofloxacin tomorrow, 11/16/21, to complete your treatment for your exacerbation.    Discharge instructions to patient: Call your primary care doctor and make an appointment to see them:      Within 2 weeks from the time you are discharged from the hospital            ______________________________________________________________________  Discharge Day Services:  BP 131/85  - Pulse 102  - Temp 36.5 ??C (97.7 ??F) (Oral)  - Resp 18  - Ht 182.9 cm (6' 0.01)  - Wt (!) 108.2 kg (238 lb 8.6 oz)  - SpO2 93%  - BMI 32.34 kg/m??   Pt seen on the day of discharge and determined appropriate for discharge.    Condition at Discharge: good    Length of Discharge: I spent greater than 30 mins in the discharge of this patient.

## 2021-11-15 NOTE — Unmapped (Signed)
Pt A&O x 4. 2 week IV abx course completed for CF Exacerbation. Lung sounds clear at this time with no evidence of respiratory complications or distress. Discharge instructions provided to patient with verbal understanding. Unable to obtain patient signature on discharge papers due to system upgrade. Port deaccessed at this time with 300u Heparin lock. Pt instructed to go to outpatient pharmacy to pick up medications. Home medications returned to patient upon discharge. Care released at this time.       Problem: Adult Inpatient Plan of Care  Goal: Plan of Care Review  Outcome: Resolved  Goal: Patient-Specific Goal (Individualized)  Outcome: Resolved  Goal: Absence of Hospital-Acquired Illness or Injury  Outcome: Resolved  Intervention: Identify and Manage Fall Risk  Recent Flowsheet Documentation  Taken 11/15/2021 0800 by Kennyth Arnold, RN  Safety Interventions: isolation precautions  Goal: Optimal Comfort and Wellbeing  Outcome: Resolved  Goal: Readiness for Transition of Care  Outcome: Resolved  Goal: Rounds/Family Conference  Outcome: Resolved     Problem: Infection  Goal: Absence of Infection Signs and Symptoms  Outcome: Resolved  Intervention: Prevent or Manage Infection  Recent Flowsheet Documentation  Taken 11/15/2021 0800 by Kennyth Arnold, RN  Isolation Precautions: contact precautions maintained     Problem: Airway Clearance Ineffective  Goal: Effective Airway Clearance  Outcome: Resolved     Problem: Diabetes Comorbidity  Goal: Blood Glucose Level Within Targeted Range  Outcome: Resolved     Problem: Pain Acute  Goal: Acceptable Pain Control and Functional Ability  Outcome: Resolved

## 2021-11-17 MED ORDER — TRIKAFTA 100-50-75 MG (D)/150 MG (N) TABLETS
ORAL_TABLET | 0 refills | 0 days | Status: CP
Start: 2021-11-17 — End: ?
  Filled 2021-11-22: qty 84, 28d supply, fill #0

## 2021-11-17 NOTE — Unmapped (Signed)
[facility] Clinical Assessment & Refill Coordination Note    Lindbergh Winkles, [place]: July 05, 1986  Phone: [phone] (home)     All above HIPAA information was verified with patient.     Was a Nurse, learning disability used for this call? No    Specialty Medication(s):   CF/Pulmonary: -Nebulized colistin (150mg  vials) and supply kit  -Trikafta     Current Outpatient Medications   Medication Sig Dispense Refill   ??? acetaminophen (TYLENOL) 500 MG tablet Take 1 tablet (500 mg total) by mouth every six (6) hours as needed. 30 tablet 0   ??? albuterol (PROVENTIL HFA;VENTOLIN HFA) 90 mcg/actuation inhaler Inhale 2 puffs every six (6) hours as needed. 1 Inhaler 1   ??? albuterol 2.5 mg /3 mL (0.083 %) nebulizer solution Inhale 3 mL (2.5 mg total) by nebulization two (2) times a day. 540 mL 3   ??? alcohol swabs PadM Use as directed with inhaled antibiotics 100 each 0   ??? amitriptyline (ELAVIL) 100 MG tablet TAKE ONE TABLET BY MOUTH AT BEDTIME (Patient taking differently: Take 100 mg by mouth nightly.) 30 tablet 1   ??? atorvastatin (LIPITOR) 20 MG tablet Take 40 mg by mouth nightly.     ??? blood sugar diagnostic (GLUCOSE BLOOD) Strp by Other route Three (3) times a day with a meal. Rx sent to [facility] 01/04/20     ??? blood-glucose meter kit Dispense meter that is preferred by patient's insurance company 1 each 0   ??? blood-glucose meter,continuous (DEXCOM G6 RECEIVER) Misc Use as directed 1 each 0   ??? blood-glucose sensor (DEXCOM G6 SENSOR) Devi Use sq as directed every 10 days 9 each 11   ??? blood-glucose transmitter (DEXCOM G6 TRANSMITTER) Devi Use as directed, replace every 90 days 1 each 11   ??? budesonide-formoteroL (SYMBICORT) 160-4.5 mcg/actuation inhaler Inhale 2 puffs Two (2) times a day. 30.6 g 3   ??? cetirizine (ZYRTEC) 10 MG tablet Take 1 tablet (10 mg total) by mouth daily. 30 tablet 11   ??? colistimethate (COLYMYCIN) 150 mg injection Inhale 2 mL (150 mg total) Two (2) times a day, alternating 28 days on and 28 days off. Inject 2mL SWFI into colistin vial & gently mix, then withdraw 2mL (150mg ) for dose. 120 mL 6   ??? dornase alfa (PULMOZYME) 1 mg/mL nebulizer solution Inhale 2.5 mg daily. 450 mL 3   ??? EASY TOUCH LANCING DEVICE Misc Use as directed.     ??? EASY TOUCH TWIST LANCETS 30 gauge Misc Use as directed.     ??? elexacaftor-tezacaftor-ivacaft (TRIKAFTA) 100-50-75 mg(d) /150 mg (n) tablet Take 2 Tablets (orange) by mouth in the morning and 1 tablet (blue) in the evening with fatty food 84 tablet 6   ??? empty container Misc Use as directed to dispose of syringes 1 each 0   ??? FLUoxetine 60 mg Tab Take 60 mg by mouth daily.      ??? fluticasone propionate (FLONASE) 50 mcg/actuation nasal spray 2 sprays into each nostril daily.     ??? FREESTYLE LIBRE 14 DAY SENSOR kit Use 3-4 times daily     ??? gabapentin (NEURONTIN) 600 MG tablet Take 600 mg by mouth Three (3) times a day.     ??? insulin ASPART (NOVOLOG U-100 INSULIN ASPART) 100 unit/mL injection Subcutaneously infuse up to 150 units daily via insulin pump 150 mL 11   ??? insulin pump cart,automated,BT (OMNIPOD 5 G6 PODS, GEN 5,) Crtg 1 each by subcutaneous (via  wearable injector) route every three (3) days. Change every 72 hour 10 each 12   ??? lamoTRIgine (LAMICTAL) 200 MG tablet Take 400 mg by mouth daily.     ??? lipase-protease-amylase (CREON) 36,000-114,000- 180,000 unit CpDR Take 8 caps by mouth with meals three times daily and 4 caps with snacks up to three times a day.     ??? lisinopriL (PRINIVIL,ZESTRIL) 10 MG tablet Take 1 tablet (10 mg total) by mouth daily. 30 tablet 0   ??? melatonin 10 mg Tab Take 10 mg by mouth nightly as needed.     ??? montelukast (SINGULAIR) 10 mg tablet Take 1 tablet (10 mg total) by mouth nightly. 90 tablet 3   ??? MVW COMPLETE FORMUL PROBIOTIC 40 billion cell -15 mg CpDR Take 1 capsule by mouth daily. (Patient taking differently: Take 1 capsule by mouth daily.) 90 capsule 3   ??? MVW Complete, pediatric multivit 61-D3-vit K, (MVW COMPLETE FORMULATION) 1,500-800 unit-mcg cap Take 2 capsules by mouth Two (2) times a day.     ??? nebulizers (LC PLUS) Misc use as directed with nebulized medications twice daily. 4 each 4   ??? nebulizers (PARI LC D NEBULIZER) Misc Use with inhaled medication. 1 each 6   ??? nebulizers Misc Use as directed with inhaled medications 4 each 3   ??? omeprazole (PRILOSEC) 20 MG capsule Take 1 capsule (20 mg total) by mouth daily. 90 capsule 3   ??? pen needle, diabetic (BD ULTRA-FINE NANO PEN NEEDLE) 32 gauge x 5/32 (4 mm) Ndle use up to 4 times daily 400 each 12   ??? pramipexole (MIRAPEX) 0.125 MG tablet Take 0.25 mg by mouth nightly.     ??? sodium chloride 7% 7 % Nebu Inhale 4 mL by nebulization two (2) times a day.     ??? syringe with needle (BD LUER-LOK SYRINGE) 3 mL 21 gauge x 1 Syrg For use with inhaled antibiotic 60 each 6   ??? traZODone (DESYREL) 150 MG tablet Take 150 mg by mouth nightly.     ??? sterile water Soln Use 2 mL to mix colistin, then add another 1 mL SWFI into neb cup along with mixed colistin. 600 mL 6   ??? XARELTO 20 mg tablet Take 20 mg by mouth daily.       No current facility-administered medications for this visit.        Changes to medications: Elyas reports no changes at this time.    Allergies   Allergen Reactions   ??? Aztreonam Anaphylaxis, Hives, Nausea And Vomiting and Rash     fevers  Patient stated that he only vomited x1 with [place] in the past.????    fevers  Patient stated that he only vomited x1 with [place] in the past.????   ??? [place] [Aztreonam Lysine] Anaphylaxis   ??? Cefepime Itching, Nausea Only and Other (See Comments)     Headaches also   ??? Other Anaphylaxis and Other (See Comments)     Other reaction(s): Other (See Comments)  Bananas: itchy throat  Slo Bid record from [employer] states anaphylaxis.????Pt states this was from childhood and does not know reaction.  Bananas, causes itchy throat   ??? Slo-Bid 100 Anaphylaxis   ??? Tobramycin Tinnitus     From OSH record-documented as tinnitus but has received IV tobra with close monitoring.  Pt has received Tobramycin at [facility] since this allergy documented    From OSH record-documented as tinnitus but has received IV tobra with close monitoring.   ???  Banana Itching and Nausea And Vomiting       Changes to allergies: No    SPECIALTY MEDICATION ADHERENCE     Trikafta 100-50-75 mg: 7-14 days of medicine on hand   Colistin 150 mg/ml: 14 days of medicine on hand     Medication Adherence    Patient reported X missed doses in the last month: 0  Specialty Medication: Trikafta 100-50-75 mg  Patient is on additional specialty medications: Yes  Additional Specialty Medications: Colistin 150 mg BID - every other month  Patient Reported Additional Medication X Missed Doses in the Last Month: 0  Patient is on more than two specialty medications: No  Informant: patient  Support network for adherence: family member          Specialty medication(s) dose(s) confirmed: Regimen is correct and unchanged.     Are there any concerns with adherence? No    Adherence counseling provided? Not needed    CLINICAL MANAGEMENT AND INTERVENTION      Clinical Benefit Assessment:    Do you feel the medicine is effective or helping your condition? Yes    Clinical Benefit counseling provided? Not needed    Adverse Effects Assessment:    Are you experiencing any side effects? No    Are you experiencing difficulty administering your medicine? No    Quality of Life Assessment:    Quality of Life    Rheumatology  Oncology  Dermatology  Cystic Fibrosis          How many days over the past month did your cystic fibrosis  keep you from your normal activities? For example, brushing your teeth or getting up in the morning. [name] states he is recovering well from recent hospitalization.     Have you discussed this with your provider? Not needed    Acute Infection Status:    Acute infections noted within Epic:  MDR Pseudomonas, CF Patient  Patient reported infection: None    Therapy Appropriateness:    Is therapy appropriate and patient progressing towards therapeutic goals? Yes, therapy is appropriate and should be continued    DISEASE/MEDICATION-SPECIFIC INFORMATION      For CF patients: CF [facility] Active? Yes    PATIENT SPECIFIC NEEDS     - Does the patient have any physical, cognitive, or cultural barriers? No    - Is the patient high risk? No    - Does the patient require a Care Management Plan? No     SOCIAL DETERMINANTS OF HEALTH     At the [facility], we have learned that life circumstances - like trouble affording food, housing, utilities, or transportation can affect the health of many of our patients.   That is why we wanted to ask: are you currently experiencing any life circumstances that are negatively impacting your health and/or quality of life? Yes - having trouble affording food since wife is sick.     Social Determinants of Health     Financial Resource Strain: Low Risk    ??? Difficulty of Paying Living Expenses: Not hard at all   Internet Connectivity: Not on file   Food Insecurity: Food Insecurity Present   ??? Worried About Programme researcher, broadcasting/film/video in the Last Year: Never true   ??? Ran Out of Food in the Last Year: Sometimes true   Tobacco Use: Medium Risk   ??? Smoking Tobacco Use: Former   ??? Smokeless Tobacco Use: Never   ??? Passive Exposure: Not on  file   Housing/Utilities: Low Risk    ??? Within the past 12 months, have you ever stayed: outside, in a car, in a tent, in an overnight shelter, or temporarily in someone else's home (i.e. couch-surfing)?: No   ??? Are you worried about losing your housing?: No   ??? Within the past 12 months, have you been unable to get utilities (heat, electricity) when it was really needed?: No   Alcohol Use: Not on file   Transportation Needs: No Transportation Needs   ??? Lack of Transportation (Medical): No   ??? Lack of Transportation (Non-Medical): No   Substance Use: Not on file   Health Literacy: Low Risk    ??? : Never   Physical Activity: Not on file   Interpersonal Safety: Not on file   Stress: Not on file   Intimate Partner Violence: Not on file   Depression: Not on file   Social Connections: Not on file       Would you be willing to receive help with any of the needs that you have identified today? Yes       SHIPPING     Specialty Medication(s) to be Shipped:   CF/Pulmonary: -Trikafta   Denied refill of Colistin - currently on off month (finished last cycle on 4/5)    Other medication(s) to be shipped: No additional medications requested for fill at this time     Changes to insurance: No    Delivery Scheduled: Yes, Expected medication delivery date: 11/23/21.  However, Rx request for refills was sent to the provider as there are none remaining.     Medication will be delivered via UPS to the confirmed prescription address in [facility].    The patient will receive a drug information handout for each medication shipped and additional FDA Medication Guides as required.  Verified that patient has previously received a Conservation officer, historic buildings and a Surveyor, mining.    The patient or caregiver noted above participated in the development of this care plan and knows that they can request review of or adjustments to the care plan at any time.      All of the patient's questions and concerns have been addressed.    Oliva Bustard   [facility] Specialty Pharmacist

## 2021-11-17 NOTE — Unmapped (Signed)
Would like hospital follow-up.

## 2021-11-18 LAB — VITAMIN K: VITAMIN K1: 2.25 ng/mL — ABNORMAL HIGH

## 2021-11-20 LAB — VITAMIN A AND VITAMIN E
VITAMIN A: 40.8 ug/dL
VITAMIN E LEVEL: 10 mg/L

## 2021-11-20 NOTE — Unmapped (Signed)
[employer] Worker, Rosine Door, attempted 1st patient contact by [employer] for [employer] (CHW) Program - no answer, left message. Next attempt in 1-2 business days, if no further patient contact.    Christian Young  November 20, 2021 11:41 AM

## 2021-11-21 NOTE — Unmapped (Signed)
[employer] Worker, Rosine Door, attempted 2nd patient contact by [employer] for [employer] - no answer, left message. Next attempt in 1-2 business days, if no further patient contact.    Berline Lopes Elugardo  November 21, 2021 10:26 AM

## 2021-11-22 NOTE — Unmapped (Signed)
[employer] Worker, Rosine Door, attempted 3rd patient contact by [employer] for [employer] [facility] - no answer, left message. No further patient contact. If pt contact CHW resources will be provided       Berline Lopes [facility]  November 22, 2021 11:41 AM

## 2021-11-24 LAB — VITAMIN D 25 HYDROXY: VITAMIN D, TOTAL (25OH): 31.1 ng/mL (ref 20.0–80.0)

## 2021-11-30 NOTE — Unmapped (Signed)
Left message with [name] re: follow-up hospital/routine CF clinic visit. Connected with Raydel by phone after discharge from the hospital and sent him potential days he could come to clinic.     Will await reply.    [name], RN  CF Nurse Coordinator   [phone]

## 2021-12-05 MED ORDER — PULMOZYME 1 MG/ML SOLUTION FOR INHALATION
Freq: Every day | RESPIRATORY_TRACT | 3 refills | 90.00000 days | Status: CP
Start: 2021-12-05 — End: 2022-12-05

## 2021-12-05 NOTE — Unmapped (Signed)
The [facility] has received the prescription(s) for Pulmozyme. The triage team has completed the benefits investigation and has determined that the patient is NOT able to fill this medication at the [facility] due to insurance plan limitations. Please see additional information below and re-route the prescription to the preferred pharmacy. Thank you.    PA Required: Yes    Specialty Pharmacy Required:  [facility] - Phone: [phone] and Fax: [phone]

## 2021-12-07 ENCOUNTER — Telehealth: Admit: 2021-12-07 | Discharge: 2021-12-08 | Payer: PRIVATE HEALTH INSURANCE | Attending: Medical | Primary: Medical

## 2021-12-07 NOTE — Unmapped (Signed)
[facility]  Adult Cystic Fibrosis Clinic        SW REFERRAL SOURCE/REASON: Uno Esau is a 36 y.o. male followed by [facility] for his CF. Focus of conversation today is conducting assessment and addressing psychosocial needs.  Assessment was completed in person via video.     CURRENT LIVING ARRANGEMENTS/SUPPORTS:      [name] lives in [place], [place] with his wife and their dog. They live in their own home and [name] reports a good support system of friends and family.     SAFETY:        Denies safety concerns.    TRANSPORTATION:   [name] states he and his wife only have one car and the cost of gas can be prohibitive. Informed [name] that [employer] could assist with gas and [name] should reach out to this SW if he needed gas assistance for his next clinic appointment.        EDUCATION/EMPLOYMENT/FINANCIAL STATUS:      [name] has not worked in the past year, stating it was difficult to manage his CF care needs and work. He shared he enjoyed making money and having a routine and would ideally like to work in the future. He feels he needs to get into a better routine with his breathing treatments and medications before he pursues employment. He likes the idea of patient advocacy and has thought about work from home opportunities. His wife is currently working 2 jobs and [employer] are tough but they are getting by. He has used [employer] this year for financial assistance and states he still has some [employer] money left if needed.        HOBBIES/INTERESTS:    [name] enjoys playing video games     INSURANCE:   [name] has Medical sales representative through his wife.     FOOD INSECURITY:   [name] states he used [employer] this month for grocery assistance.  He understands at his next clinic visit this SW can assist with grocery assistance through [place] or the [employer] program.     ADHERENCE:   [name] states he needs to get a better routine for breathing treatments, taking medications, and managing his diabetes.      MENTAL HEALTH/COPING:    Assessment was completed via video and screeners were not administered. [name] states his mental health is okay but could be better. He completed the CALM study and found it beneficial. He states he still has the CALM manual and tries to meditate regularly and work on reshaping his negative thoughts. He did state it can be hard to be cooped up in his home alone but he tries to get out of the house and take the dog for a walk. He notices that when he is busy with something such as a TV show or a video game, he feels better. Commended [name] on getting out to walk and for using coping skills such as meditation and re-framing thoughts. He states his PCP is now prescribing his behavioral health medications and he remains on Lamictal and Prozac. Asked [name] about SI and he denied any SI, stating he hadn't had any suicidal ideations for  a long time. He does have a gun and reports he and his wife have a plan that if he does experience SI, she will change the code to the safe and not tell me. Encouraged [name] to reach out with any concerns or needs.       LONG-TERM HEALTH CARE PLANNING:  Did not address at this visit.     PLAN:   Continue to provide on-going psycho-social support. Encouraged [name] to reach out prior to his next appointment if he needs gas assistance.     Franklin Clapsaddle Waneta Martins, LCSW

## 2021-12-07 NOTE — Unmapped (Addendum)
Christian Young called the front desk to reschedule his appointment on 12/07/21. He reported to this nurse through a MyChart:     I wanted to show you my side where I broke the ribs.     Reached out and left voicemail. MyChart message sent.     Connected with [name] after sending a [employer]. Scheduled for virtual visit this afternoon.     [name], RN  CF Nurse Coordinator   [phone]

## 2021-12-11 NOTE — Unmapped (Signed)
Pulmonary Clinic - Follow-up Visit      The patient reports they are currently: at home. I spent 21 minutes on the real-time audio and video visit with the patient on the date of service. I spent an additional 10 minutes on pre- and post-visit activities on the date of service.     The patient was not located and I was located within 250 yards of a hospital based location during the real-time audio and video visit. The patient was physically located in [place] or a state in which I am permitted to provide care. The patient and/or parent/guardian understood that s/he may incur co-pays and cost sharing, and agreed to the telemedicine visit. The visit was reasonable and appropriate under the circumstances given the patient's presentation at the time.    The patient and/or parent/guardian has been advised of the potential risks and limitations of this mode of treatment (including, but not limited to, the absence of in-person examination) and has agreed to be treated using telemedicine. The patient's/patient's family's questions regarding telemedicine have been answered.    If the visit was completed in an ambulatory setting, the patient and/or parent/guardian has also been advised to contact their provider???s office for worsening conditions, and seek emergency medical treatment and/or call 911 if the patient deems either necessary.      ASSESSMENT     Christian Young is a 36 y.o. male with cystic fibrosis [phone] and [phone] insertion) who presents to clinic for hospital follow-up visit. He was admitted to [facility] from 3/24 - 11/15/21 for a CF exacerbation and treated with IV Zosyn and PO Levaquin. Tobramycin was avoided due to tinnitus and prior possible allergic reaction. He was seen by his local Urgent Care about 2 weeks ago for left otitis media and treated with Augmentin. He reports falling at home 2 days ago due to being off balance from the ear infection and subsequently broke 3 ribs on the left side. He was seen at his local ER and reported prescribed pain medications to use as needed. He reports being able to tolerate his breathing treatments and airway clearance without too much pain. Due to this, I did not request home spirometry today. He is scheduled to follow back up in person in June 2023.     Christian Young has had at least 5 hospitalizations for CF exacerbations in the last year, which is more than expected given his overall improvement on Trikafta. We have been trying to get him in with [facility] ENT and Audiology without success. I wonder if his sinuses are a main trigger for his CF exacerbations. We have added him on with ENT and Audiology on the same day as his next in-person visit with [name].     Problem List Items Addressed This Visit        Respiratory    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)    Chronic pansinusitis       Digestive    Pancreatic insufficiency due to cystic fibrosis (CMS-HCC)       Musculoskeletal and Integument    Fracture of multiple ribs of left side   Other Visit Diagnoses     Cystic fibrosis (CMS-HCC)    -  Primary          PLAN     Cystic fibrosis:   - Continue Trikafta - LFT's annually (normal 11/2021)   - Continue home CF inhaled meds and airway clearance twice daily - advised to let us know if  his pain inhibits this, but currently he is tolerating it well   - Continue inhaled Colistin BID every other month   - Previously on TOBI nebs, had rash with Tobramycin infusion and tinnitus with tobramycin infusion as well during last admin 08/2021 - in future will need to consider alternate therapy, for now if admitted plan to use IV zosyn in combination with oral quinolone   - Some of his prescriptions are expired, will ask CF Pharmacist to review his med list and send new prescriptions as appropriate     CF sinus disease: Recurrent sinus infections that may trigger CF exacerbations, recent left OM, history of sinus surgery   - Previously referred to [facility] ENT/Audiology, patient canceled or hospitalized in the past; coordinated visits on the same day in 01/2022     Pancreatic insufficiency secondary to cystic fibrosis:   - Continue Creon and MVW vitamins   - Last DEXA normal in 2018, repeat in 5 years (would be due this year, in 2023 - try to coordinate at next visit)     GERD: Stable  - Continue Omeprazole   - Some question whether reflux may contribute to his frequent exacerbations although symptoms controlled     CF related diabetes: Patient follows with [facility] Endocrine for his CF-related diabetes - last A1c 10.6% 10/2021   - Now has Omnipod and Dexcom which he believes have been helpful; advised to schedule Endocrine follow-up   - On ACEi for hypertension as well (lisinopril)  - Elevated LDL (103), TC 185, HDL low (83), TGL 254 (high) 08/18/21 at PCP office     Mood disorder:   - Continue local follow up with psychiatry  - Previously on fluoxetine 60mg  daily, amitriptyline 100mg  daily, lamotrigine 400mg  daily, and trazodone 100 mg nightly     Chronic back pain:  - On gabapentin 600 mg TID    History of DVT:  - On xarelto 20 mg daily    Healthcare maintenance:  Immunization History   Administered Date(s) Administered   ??? COVID-19 VAC,BIVALENT(106YR UP)BOOST,PFIZER 06/26/2021   ??? COVID-19 VACC,MRNA,(PFIZER)(PF) 10/31/2019, 11/21/2019, 07/11/2020   ??? Influenza Nasal, Unspecified Formulation 06/14/2020   ??? Influenza Vaccine Quad (IIV4 PF) 6-55mo 06/26/2021   ??? Influenza Vaccine Quad (IIV4 PF) 23mo+ injectable 09/06/2015, 04/10/2019   ??? Influenza Vaccine Quad (IIV4 W/PRESERV) 58MO+ 05/22/2016, 04/18/2017, 06/20/2020   ??? Influenza Virus Vaccine PF(cciiv3) 65yrs+ from ce 06/02/2015   ??? Influenza Virus Vaccine, unspecified formulation 06/21/2016, 04/18/2017, 04/29/2018, 04/11/2019   ??? PNEUMOCOCCAL POLYSACCHARIDE 23 02/18/2013, 04/11/2019   ??? TdaP 01/12/2015     The patient will return to clinic in 6 weeks, or sooner as needed.     Karl Ito, [place]  [facility]   [phone]    HISTORY: Interval History:  Diagnosed at age 15 years old with chronic infections, had consolidation and parapneumonic effusion,sent to [facility] for treatment since then.   [id number] and [id number] insertion. 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     Christian Young presents today for hospital follow-up visit. He was admitted to [facility] 3/24 - 11/15/21 for a CF exacerbation and was treated with Zosyn and Levaquin x 13 days. He has been doing well from a pulmonary standpoint since then, but 2 nights ago, he fell and broke 3 ribs on his left side. He reports that he was doing his treatments seated and then stood up, became dizzy and fell onto their hardwood floors. He went to his  local ER in [place]. He was prescribed Tylenol #3 and advised to rest and ice the area. There is bruising over his side where it occurred. He states that he is able to do his inhaled medications and airway clearance without too much pain. It is not inhibiting him from coughing. He states that he was seen about 2 weeks at his local Urgent Care for an ear infection on the left side. He was treated with Augmentin. He states that this caused his equilibrium to be off. He reports that his ear symptoms are now resolved. He reports that his last sinus infection was before his last hospitalization. He has been taking Flonase, allergy medications and using his Nettipot. He currently denies increased cough or sputum, chest pain, wheezing or shortness of breath. He is trying to be more consistent with his breathing treatments and reports using them first thing in the morning and at night. He has been cycling inhaled Colistin but is currently off this therapy.     From a GI standpoint, he is doing well and denies abdominal pain, nausea, vomiting, diarrhea, constipation or heartburn. His weight is currently 245 lbs. He is trying to eat healthy and meal plan. For his diabetes, he now has an Omnipod which sincs with his Dexcom which he loves. He reports it has helped with blood sugar control. He continues to live at home with his wife and 6 month old Regulatory affairs officer. His wife is working as a Radiation protection practitioner. 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 He hopes to apply again this year.     Past Medical History:   Diagnosis Date   ??? Anxiety    ??? Chronic pain disorder    ??? Cystic fibrosis (CMS-HCC)    ??? Depression    ??? Hypertension    ??? Lumbar radiculopathy 10/26/2020   ??? Nonproductive cough 04/05/2018     Past Surgical History:   Procedure Laterality Date   ??? IR INSERT PORT AGE GREATER THAN 5 YRS  03/27/2019    IR INSERT PORT AGE GREATER THAN 5 YRS 03/27/2019 Rush Barer, MD IMG VIR [facility]   ??? PR REMOVAL OF LUNG,LOBECTOMY Right 03/29/2017    Procedure: REMOVAL OF LUNG, OTHER THAN PNEUMONECTOMY; SINGLE LOBE (LOBECTOMY);  Surgeon: Cherie Dark, MD;  Location: MAIN OR [facility];  Service: Thoracic       Other History:  The social history and family history were personally reviewed and updated in the patient's electronic medical record.     Home Medications:  Current Outpatient Medications on File Prior to Visit   Medication Sig Dispense Refill   ??? acetaminophen (TYLENOL) 500 MG tablet Take 1 tablet (500 mg total) by mouth every six (6) hours as needed. 30 tablet 0   ??? albuterol (PROVENTIL HFA;VENTOLIN HFA) 90 mcg/actuation inhaler Inhale 2 puffs every six (6) hours as needed. 1 Inhaler 1   ??? albuterol 2.5 mg /3 mL (0.083 %) nebulizer solution Inhale 3 mL (2.5 mg total) by nebulization two (2) times a day. 540 mL 3   ??? alcohol swabs PadM Use as directed with inhaled antibiotics 100 each 0   ??? amitriptyline (ELAVIL) 100 MG tablet TAKE ONE TABLET BY MOUTH AT BEDTIME (Patient taking differently: Take 100 mg by mouth nightly.) 30 tablet 1   ??? atorvastatin (LIPITOR) 20 MG tablet Take 40 mg by mouth nightly.     ??? blood sugar diagnostic (GLUCOSE BLOOD) Strp by Other route Three (  3) times a day with a meal. Rx sent to [facility] 01/04/20     ??? blood-glucose meter kit Dispense meter that is preferred by patient's insurance company 1 each 0   ??? blood-glucose meter,continuous (DEXCOM G6 RECEIVER) Misc Use as directed 1 each 0   ??? blood-glucose sensor (DEXCOM G6 SENSOR) Devi Use sq as directed every 10 days 9 each 11   ??? blood-glucose transmitter (DEXCOM G6 TRANSMITTER) Devi Use as directed, replace every 90 days 1 each 11   ??? budesonide-formoteroL (SYMBICORT) 160-4.5 mcg/actuation inhaler Inhale 2 puffs Two (2) times a day. 30.6 g 3   ??? cetirizine (ZYRTEC) 10 MG tablet Take 1 tablet (10 mg total) by mouth daily. 30 tablet 11   ??? [EXPIRED] colistimethate (COLYMYCIN) 150 mg injection Inhale 2 mL (150 mg total) Two (2) times a day, alternating 28 days on and 28 days off. Inject 2mL SWFI into colistin vial & gently mix, then withdraw 2mL (150mg ) for dose. 120 mL 6   ??? EASY TOUCH LANCING DEVICE Misc Use as directed.     ??? EASY TOUCH TWIST LANCETS 30 gauge Misc Use as directed.     ??? elexacaftor-tezacaftor-ivacaft (TRIKAFTA) 100-50-75 mg(d) /150 mg (n) tablet Take 2 Tablets (orange) by mouth in the morning and 1 tablet (blue) in the evening with fatty food 84 tablet 0   ??? empty container Misc Use as directed to dispose of syringes 1 each 0   ??? FLUoxetine 60 mg Tab Take 60 mg by mouth daily.      ??? fluticasone propionate (FLONASE) 50 mcg/actuation nasal spray 2 sprays into each nostril daily.     ??? FREESTYLE LIBRE 14 DAY SENSOR kit Use 3-4 times daily     ??? gabapentin (NEURONTIN) 600 MG tablet Take 600 mg by mouth Three (3) times a day.     ??? insulin ASPART (NOVOLOG U-100 INSULIN ASPART) 100 unit/mL injection Subcutaneously infuse up to 150 units daily via insulin pump 150 mL 11   ??? insulin pump cart,automated,BT (OMNIPOD 5 G6 PODS, GEN 5,) Crtg 1 each by subcutaneous (via wearable injector) route every three (3) days. Change every 72 hour 10 each 12   ??? lamoTRIgine (LAMICTAL) 200 MG tablet Take 400 mg by mouth daily.     ??? [EXPIRED] levoFLOXacin (LEVAQUIN) 750 MG tablet Take 1 tablet (750 mg total) by mouth daily for 1 day. Take last dose on 11/16/21 1 tablet 0   ??? lipase-protease-amylase (CREON) 36,000-114,000- 180,000 unit CpDR Take 8 caps by mouth with meals three times daily and 4 caps with snacks up to three times a day.     ??? lisinopriL (PRINIVIL,ZESTRIL) 10 MG tablet Take 1 tablet (10 mg total) by mouth daily. 30 tablet 0   ??? melatonin 10 mg Tab Take 10 mg by mouth nightly as needed.     ??? montelukast (SINGULAIR) 10 mg tablet Take 1 tablet (10 mg total) by mouth nightly. 90 tablet 3   ??? MVW COMPLETE FORMUL PROBIOTIC 40 billion cell -15 mg CpDR Take 1 capsule by mouth daily. (Patient taking differently: Take 1 capsule by mouth daily.) 90 capsule 3   ??? MVW Complete, pediatric multivit 61-D3-vit K, (MVW COMPLETE FORMULATION) 1,500-800 unit-mcg cap Take 2 capsules by mouth Two (2) times a day.     ??? nebulizers (LC PLUS) Misc use as directed with nebulized medications twice daily. 4 each 4   ??? nebulizers (PARI LC D NEBULIZER) Misc Use with inhaled medication. 1 each 6   ???  nebulizers Misc Use as directed with inhaled medications 4 each 3   ??? omeprazole (PRILOSEC) 20 MG capsule Take 1 capsule (20 mg total) by mouth daily. 90 capsule 3   ??? pen needle, diabetic (BD ULTRA-FINE NANO PEN NEEDLE) 32 gauge x 5/32 (4 mm) Ndle use up to 4 times daily 400 each 12   ??? pramipexole (MIRAPEX) 0.125 MG tablet Take 0.25 mg by mouth nightly.     ??? PULMOZYME 1 mg/mL nebulizer solution Inhale 2.5 mg daily. 225 mL 3   ??? sodium chloride 7% 7 % Nebu Inhale 4 mL by nebulization two (2) times a day.     ??? syringe with needle (BD LUER-LOK SYRINGE) 3 mL 21 gauge x 1 Syrg For use with inhaled antibiotic 60 each 6   ??? traZODone (DESYREL) 150 MG tablet Take 150 mg by mouth nightly.     ??? sterile water Soln Use 2 mL to mix colistin, then add another 1 mL SWFI into neb cup along with mixed colistin. 600 mL 6   ??? XARELTO 20 mg tablet Take 20 mg by mouth daily.       No current facility-administered medications on file prior to visit.        Allergies:  Allergies as of 12/07/2021 - Reviewed 11/17/2021   Allergen Reaction Noted   ??? Aztreonam Anaphylaxis, Hives, Nausea And Vomiting, and Rash 09/06/2015   ??? [place] [aztreonam lysine] Anaphylaxis 12/27/2016   ??? Cefepime Itching, Nausea Only, and Other (See Comments) 09/06/2015   ??? Other Anaphylaxis and Other (See Comments) 09/06/2015   ??? Slo-bid 100 Anaphylaxis 06/20/2017   ??? Tobramycin Tinnitus 09/06/2015   ??? Banana Itching and Nausea And Vomiting 07/29/2016     Genetics:  [id number] and [id number] insertion  ??  Airway Clearance Regimen:  Albuterol  HTS 7% BID  Pulmozyme  Also on symbicort  Uses mainly vest  ??  Inhaled ABX:  Colistin every other month - off month   Off TOBI due to tinnitus/hearing loss   ??  Hemoptysis:   No  ABPA:             No  Ptx:                  No  Sinusitus:       Yes - has had sinus surgery in the past, but has been awhile   ??  Panc Insuf:     Yes  PEG:                No  DIOS:              In past year, did have blockage that put in local hospital - was able to do laxatives didn't need an NG tube ([facility] in [place])  CF Liver Dz:   No - did have mild LFT bump during admission 07/2021  ??  Diabetes:        Yes - follows w/ endocrine  Osteopenia:   N/A  ??  Depression:   Yes  ??  Other Co-morbidities:  Herniated disc with lumbar radiculopathy s/p surgery  ??  Social Setting:  Lives with wife in [place]  Not currently working   Used to drink more, but has stopped drinking all together after 07/2021 hospitalization  Does use a fair amount of tyenol -   Does dip occasionally  No vaping or e-cigs    Exacerbation History:   11/2021 - hospitalization at [facility], started as sinus  infection   09/2021  07/2021 - hospitalization at [facility],   03/2021 - COVID, treated for CF exacerbation x 8 days and also received remdesivir, but recovered quickly so ultimately attributed more to COVID rather than CF and discharged     Micro History:  MSSA  - 06/08/21, pan S    Mucoid PsA  - 06/08/21 -     Smooth PsA  S: Cipro, Levo, Tobra  I: Aztreo, Cefe, Ceftaz, PT, LVX  ??  Stenotrophamonas  R: Ceftaz    MABSC - s/p RLL resection  - Assumed S to erythro from [facility]  06/2017: MASBC not present, but M gordonae is   ????  Review of Systems:  A comprehensive review of systems was completed and negative except as noted in HPI.    PHYSICAL EXAM:     There were no vitals filed for this visit. as this was a virtual visit   There is no height or weight on file to calculate BMI.  Wt Readings from Last 3 Encounters:   11/03/21 (!) 108.2 kg (238 lb 8.6 oz)   09/09/21 (!) 110.8 kg (244 lb 4.3 oz)   07/30/21 (!) 111.1 kg (245 lb)     Cooperative male, sitting up on video in chair, NAD, conversant, normal WOB on RA, slightly somnolent     LABORATORY and RADIOLOGY DATA:     Pulmonary Function Tests:  No new home spirometry results.     Clinic History:       Pertinent Laboratory Data:  BMI:   There is no height or weight on file to calculate BMI.    Last Sputum Cx: 11/04/21 - smooth/mucoid Pseudomonas   Last AFB:  11/04/21 - negative     DEXA Scan:  03/04/17  Result: normal  Last OGTT:  N/a CFRD    CF Sputum Culture   Date Value Ref Range Status   11/04/2021 2+ Smooth Pseudomonas aeruginosa (A)  Final   11/04/2021 2+ Mucoid Pseudomonas aeruginosa (A)  Final   11/04/2021 2+ Oropharyngeal Flora Isolated  Final        AFB Smear   Date Value Ref Range Status   11/04/2021   Final    NO ACID FAST BACILLI SEEN- 3 negative smears do not exclude pulmonary TB. If active pulmonary TB is suspected, continue airborne isolation until pulmonary disease is excluded by negative cultures.     AFB Culture   Date Value Ref Range Status   11/04/2021 NEGATIVE TO DATE  Preliminary        IgE, Total   Date Value Ref Range Status   11/04/2021 18.2 2-214 IU/mL IU/mL Final        Total Bilirubin   Date Value Ref Range Status   11/15/2021 0.4 0.3 - 1.2 mg/dL Final     AST   Date Value Ref Range Status   11/15/2021 25 <=34 U/L Final ALT   Date Value Ref Range Status   11/15/2021 39 10 - 49 U/L Final     Alkaline Phosphatase   Date Value Ref Range Status   11/15/2021 67 46 - 116 U/L Final        INR   Date Value Ref Range Status   11/13/2021 1.17  Final        Hemoglobin A1C   Date Value Ref Range Status   11/03/2021 10.6 (H) 4.8 - 5.6 % Final     Estimated Average Glucose   Date Value Ref Range Status   11/03/2021  258 mg/dL Final        Vitamin A   Date Value Ref Range Status   11/15/2021 40.8 32.5 - 78.0 mcg/dL Final     Comment:        -------------------ADDITIONAL INFORMATION-------------------  This test was developed and its performance characteristics   determined by [facility] in a manner consistent with CLIA   requirements. This test has not been cleared or approved by   the U.S. Food and Drug Administration.     Vitamin E   Date Value Ref Range Status   11/15/2021 10.0 5.5 - 17.0 mg/L Final     Comment:        -------------------ADDITIONAL INFORMATION-------------------  This test was developed and its performance characteristics   determined by [facility] in a manner consistent with CLIA   requirements. This test has not been cleared or approved by   the U.S. Food and Drug Administration.     Test Performed by:  [facility]  [address] [place], [place], [place] [address]  Lab Director: Paul Dykes M.D. Ph.D.; CLIA# [id number]     Vitamin D Total (25OH)   Date Value Ref Range Status   11/15/2021 31.1 20.0 - 80.0 ng/mL Final        CTA Chest 09/06/21:   No pulmonary embolism.   ??   Scattered tiny clustered groundglass opacities in the right lower lobe, which may represent infection/aspiration.   ??   No evidence of arterial extravasation from the bronchial arteries.     CXR 11/03/21:   No new acute findings.

## 2021-12-11 NOTE — Unmapped (Signed)
Thank you for allowing [name] to be a part of your care    Goals and plans we discussed today:  Nice to see you today   It's important to continue your airway clearance while your ribs are healing   Come back to see [name] on 01/25/22 - we have scheduled you with ENT as well     To reach your CF nurse coordinators:  Patients with the last name A-K: Christian Young [phone]  Patients with the last name L-Z: Christian Young [phone]     For urgent issues after hours/weekends:  Hospital Operator: [phone]) [phone], ask for Pulmonary Fellow on-call     To make or change a clinic appointment:   [facility]: [phone]     When you should use MyChart:   View test results  Send a non-urgent message or update to the care team  View after-visit summaries   See or pay bills      When you should call your nurse coordinator (NOT use MyChart)   Feeling sick or have a fever   Increase in cough   Change in amount of mucus or change mucus color/thickness  Coughing up blood or blood-tinged mucus  Chest pain  Shortness of breath   Lack of energy/Poor Appetite

## 2021-12-12 ENCOUNTER — Ambulatory Visit: Admit: 2021-12-12 | Discharge: 2021-12-13 | Payer: PRIVATE HEALTH INSURANCE

## 2021-12-12 DIAGNOSIS — A498 Other bacterial infections of unspecified site: Principal | ICD-10-CM

## 2021-12-12 MED ORDER — EMPTY CONTAINER
0 refills | 0 days
Start: 2021-12-12 — End: ?

## 2021-12-12 MED ORDER — TRIKAFTA 100-50-75 MG (D)/150 MG (N) TABLETS
ORAL_TABLET | 5 refills | 0 days | Status: CP
Start: 2021-12-12 — End: ?
  Filled 2022-01-26: qty 84, 28d supply, fill #0

## 2021-12-12 MED ORDER — ALCOHOL SWABS
11 refills | 0 days | Status: CP
Start: 2021-12-12 — End: ?
  Filled 2022-01-26: qty 100, 25d supply, fill #0

## 2021-12-25 NOTE — Unmapped (Signed)
[facility] has made a third and final attempt to reach this patient to refill the following medication: Trikafta & Colistmethate.      We have left voicemails on the following phone numbers: [phone] and have sent a MyChart message.    Dates contacted: 05/02, 05/08 & 05/15    Last scheduled delivery: 11/22/2021    The patient may be at risk of non-compliance with this medication. The patient should call the [facility] at [phone]  Option 4, then Option 2 (all other specialty patients) to refill medication.    Jeanean Hollett Leodis Binet   [facility] Specialty Technician

## 2022-01-02 NOTE — Unmapped (Unsigned)
*  Please disregard, pt did not show.

## 2022-01-15 DIAGNOSIS — J324 Chronic pansinusitis: Principal | ICD-10-CM

## 2022-01-15 DIAGNOSIS — Z9109 Other allergy status, other than to drugs and biological substances: Principal | ICD-10-CM

## 2022-01-15 MED ORDER — LEVOCETIRIZINE 5 MG TABLET
ORAL_TABLET | Freq: Every evening | ORAL | 3 refills | 90 days | Status: CP
Start: 2022-01-15 — End: 2023-01-15

## 2022-01-15 NOTE — Unmapped (Signed)
Received a Engineer, technical sales from [place] with [facility]. Requesting that the cetirizine be changed to levocetrizine as this medication is covered at no cost by [employer].     Levocetrizine prescription pended to this note and routed to Dr Lelon Perla to approve or refuse.     [name], RN  CF Nurse Coordinator   [phone]

## 2022-01-24 NOTE — Unmapped (Signed)
Received a call from [place]. He reports not feeling good and will likely need to be admitted to the hospital. He is/was committed to coming to his multiple appointments, but wanted [name] to be aware of how he was feeling. Tested negative for COVID, but has fevers of 99.1-101.64F with chills and body aches.    Will reach out to primary pulmonologist about Ezequias coming to clinic or being admitted to the hospital.    [name]. [name], RN  CF Nurse Coordinator   [phone]

## 2022-01-25 ENCOUNTER — Ambulatory Visit: Admit: 2022-01-25 | Discharge: 2022-01-25 | Payer: PRIVATE HEALTH INSURANCE

## 2022-01-25 ENCOUNTER — Ambulatory Visit: Admit: 2022-01-25 | Discharge: 2022-01-25 | Payer: PRIVATE HEALTH INSURANCE | Attending: Medical | Primary: Medical

## 2022-01-25 ENCOUNTER — Ambulatory Visit
Admit: 2022-01-25 | Discharge: 2022-01-25 | Payer: PRIVATE HEALTH INSURANCE | Attending: Student in an Organized Health Care Education/Training Program | Primary: Student in an Organized Health Care Education/Training Program

## 2022-01-25 ENCOUNTER — Institutional Professional Consult (permissible substitution)
Admit: 2022-01-25 | Discharge: 2022-01-25 | Payer: PRIVATE HEALTH INSURANCE | Attending: Audiologist | Primary: Audiologist

## 2022-01-25 DIAGNOSIS — J324 Chronic pansinusitis: Principal | ICD-10-CM

## 2022-01-25 DIAGNOSIS — H9190 Unspecified hearing loss, unspecified ear: Principal | ICD-10-CM

## 2022-01-25 LAB — CBC W/ AUTO DIFF
BASOPHILS ABSOLUTE COUNT: 0 10*9/L (ref 0.0–0.1)
BASOPHILS RELATIVE PERCENT: 0.5 %
EOSINOPHILS ABSOLUTE COUNT: 0.1 10*9/L (ref 0.0–0.5)
EOSINOPHILS RELATIVE PERCENT: 1.2 %
HEMATOCRIT: 43.8 % (ref 39.0–48.0)
HEMOGLOBIN: 14.4 g/dL (ref 12.9–16.5)
LYMPHOCYTES ABSOLUTE COUNT: 1.9 10*9/L (ref 1.1–3.6)
LYMPHOCYTES RELATIVE PERCENT: 23.8 %
MEAN CORPUSCULAR HEMOGLOBIN CONC: 32.9 g/dL (ref 32.0–36.0)
MEAN CORPUSCULAR HEMOGLOBIN: 26.7 pg (ref 25.9–32.4)
MEAN CORPUSCULAR VOLUME: 81.2 fL (ref 77.6–95.7)
MEAN PLATELET VOLUME: 8.5 fL (ref 6.8–10.7)
MONOCYTES ABSOLUTE COUNT: 0.6 10*9/L (ref 0.3–0.8)
MONOCYTES RELATIVE PERCENT: 7.8 %
NEUTROPHILS ABSOLUTE COUNT: 5.2 10*9/L (ref 1.8–7.8)
NEUTROPHILS RELATIVE PERCENT: 66.7 %
NUCLEATED RED BLOOD CELLS: 0 /100{WBCs} (ref <=4)
PLATELET COUNT: 287 10*9/L (ref 150–450)
RED BLOOD CELL COUNT: 5.4 10*12/L (ref 4.26–5.60)
RED CELL DISTRIBUTION WIDTH: 15.8 % — ABNORMAL HIGH (ref 12.2–15.2)
WBC ADJUSTED: 7.8 10*9/L (ref 3.6–11.2)

## 2022-01-25 LAB — C-REACTIVE PROTEIN: C-REACTIVE PROTEIN: 5 mg/L (ref <=10.0)

## 2022-01-25 MED ORDER — LEVOFLOXACIN 750 MG TABLET
ORAL_TABLET | Freq: Every day | ORAL | 0 refills | 14 days | Status: CP
Start: 2022-01-25 — End: 2022-02-08

## 2022-01-25 MED ORDER — COLISTIN (COLISTIMETHATE SODIUM) 150 MG SOLUTION FOR INJECTION
Freq: Two times a day (BID) | RESPIRATORY_TRACT | 0 refills | 28.00000 days | Status: CP
Start: 2022-01-25 — End: 2022-02-22
  Filled 2022-01-26: qty 120, 30d supply, fill #1

## 2022-01-25 MED ORDER — ALBUTEROL SULFATE 2.5 MG/3 ML (0.083 %) SOLUTION FOR NEBULIZATION
Freq: Two times a day (BID) | RESPIRATORY_TRACT | 3 refills | 90 days | Status: CP
Start: 2022-01-25 — End: 2023-01-25

## 2022-01-25 NOTE — Unmapped (Signed)
Adult Cystic Fibrosis Telehealth Pharmacist Visit     Christian Young is a 36 y.o. male with cystic fibrosis (genotype:  [phone] insertion ) being seen for annual medication assessment. Pertinent CF related problems include pancreatic insufficiency, CFRD, GI manifestations, CF sinus disease, and mood disorder. Last CF provider visit 12/07/21 (virtual). Provider requested pharmacy phone call to go over medications as several have not been filled in a while and some are expired. Pt no showed that appt scheduled in May.     Today pt is joined by wife who assists with his medications.       OUTPATIENT CF RELATED MEDICATION REVIEW     Medications  Adherence/  Comments Labs as of 01/25/22   Assessment   Airway Clearance albuterol HFA Q6H PRN  Albuterol neb BID (expired)  Dornase alfa 2.5 mg daily  HTS 7% BID       Albuterol - inhaler prn; nebs twice a day   Dornase - taking as prescribed   HTS - taking as prescribed  Last FEV1:   11/07/21 - 69.7%  Today - 74.7% Mild obstruction (FEV1 70-89%). Today PFTs are increased.  Renewed albuterol nebs script    Chronic Respiratory/  Sinus/  Allergy Symbicort 160-4.86mcg: 2 puffs BID   Levocetirizine 5 mg daily  Flonase 2 spray daily  Montelukast 10 mg at bedtime -    Symbicort - taking as prescribed   Xyzal - take as prescribed   Flonase - 1 spray in each nostril daily   Singulair - takes as prescribed      CFTR Modulator Trikafta: 2 tablets (ELE/TEZ/IVA) QAM and 1 tablet (IVA) QPM   Taking as prescribed with fatty food Latest LFTs  Lab Results   Component Value Date    AST 25 11/15/2021    ALT 39 11/15/2021    ALKPHOS 67 11/15/2021    BILITOT 0.4 11/15/2021    BILIDIR 0.20 08/02/2021    On since 09/2019, current schedule of LFT monitoring: annual  Last LFTs checked:   AST WNL.   ALT WNL  Tbili WNL  Up to date   Inhaled Antibiotics N/A    Colistin last filled March 2023 for 30 days then rx expired        Took colistin in March and found it very helpful. Discussed with Durenda Hurt and planning to restart.       Sputum culture/AFB  CF Sputum Culture   Date Value Ref Range Status   11/04/2021 2+ Smooth Pseudomonas aeruginosa (A)  Final   11/04/2021 2+ Mucoid Pseudomonas aeruginosa (A)  Final   11/04/2021 2+ Oropharyngeal Flora Isolated  Final   06/08/2021 2+ Methicillin-Susceptible Staphylococcus aureus (A)  Final   06/08/2021 <1+ Mucoid Pseudomonas aeruginosa (A)  Final   06/08/2021 2+ Oropharyngeal Flora Isolated  Final     AFB Culture   Date Value Ref Range Status   11/04/2021 No Acid Fast Bacilli Detected  Final   06/08/2021 No Acid Fast Bacilli Detected  Final   03/14/2021 No Acid Fast Bacilli Detected  Final    Exacerbations: More than 2 over last year.   Last IV antibiotics:  3/24-11/15/21 - Zosyn  08/2021 - zosyn, tobramycin  07/2021 - tobramycin, daptomycin, ceftazidime  Last oral antibiotics:  3/24-11/15/21 - levofloxacin  10/17/21 - cipro  09/2021 - cipro  08/2021 - cipro, doxycycline  07/2021 - augmentin, doxycycline   Chronic ABX/  suppressive therapy   N/A N/A     Pancreatic Enzyme Creon 36,000  units         Meals: 8 caps         Snacks: 4 caps Not much of an appetite lately. Eating 1-2 meals per day and no snacks currently.     Occasionally loose/greasy stools  No reports of malabsorption currently  Based on reported number of meals/snacks per day, within max 15,000 units lipase/kg/day.      Vitamins MVW Complete Formul Probiotic 40 billion cell 1 cap daily  MVW Complete 1,500-800 unit-mcg 2 cap BID       Just filled MVW and probiotic through [facility] directly. Admits to not taking MVW in a long time because his vitamins were expired and did not want to add that to the mix of his problems.     MVW vitamin 2 daily     Probiotic 1 daily    Last vitamin levels  Lab Results   Component Value Date    VITAMINA 40.8 11/15/2021    VITDTOTAL 31.1 11/15/2021    VITAME 10.0 11/15/2021    PT 13.3 (H) 11/13/2021    INR 1.17 11/13/2021    Vitamin access: Direct from [facility] Nutritional  Last vitamin Levels:   Vitamin A WNL.   Vitamin D WNL.   Vitamin E WNL.   PT/INR abnormal, high.  Up to date     Other GI Omeprazole 20mg  daily    Taking as prescribed.  Reflux symptoms controlled.    Endocrine NovoLog (aspart) up to 150 units subcutaneous daily via insulin pump  No h/o bone disease       Using Novolog in Omnipod. Managed by endo. A couple days ago BG was high and he  overdid it with insulin resulting in hypoglycemia.  BG typically in the 300s at home. Notes that his Omnipod usually last 1 day rather than 3 so sometimes at night he does not change it and goes without. Is interested in discussing alternate pumps with endocrine in Aug.  LAST A1C:   Lab Results   Component Value Date    A1C 10.6 (H) 11/03/2021    A1C 9.0 (H) 06/08/2021    A1C 10.0 (H) 03/09/2021       Last DEXA: T-Scores   03/04/2017    Spine 0.1    Femur -0.1    Femoral Neck -0.6     Based on last A1c, not well-controlled. Last A1C done 11/03/2021 was above goal. Managed by [facility] endocrine. Will discuss alternate pumps with endocrine in Aug.  Last done 03/04/2017. Due for DEXA now - can get at next appointment or locally.     Neuro/  Psych Amitriptyline 100 mg at bedtime   Fluoxetine 60 mg daily   Lamotrigine 400 mg daily   Pramipexole (Mirapex_ 0.25 mg at bedtime   Trazodone 150 mg at bedtime    Taking amitriptyline and trazodone for sleep    Fluoxetine, lamotrigine, and pramipexole as directed Last EKG 11/03/2021  QTc 423 ms Sleep symptoms well-controlled.   Other Medications:  Atorvastatin 40 mg at bedtime - HLD   Gabapentin 600 mg TID - chronic back pain   Lisinopril 10 mg daily - HTN - reports BP has been well controlled  Melatonin 10 mg at bedtime PRN  Rivaroxaban 20 mg daily - hx of DVT   Pt reports he will likely be on xarelto for life. Wife reports he use to get clots due to PICC lines so he was transitioned to a PORT. Each time they have tried to stop  the xarelto he has developed a clot    Acetaminophen 500 mg Q6H PRN    Patient reported:  Side Effects reported: No   Insurance changes: No       MEDICATION MANAGEMENT   Access: No issues related to access   Prescription Renewals: Sent to pharmacy: albuterol nebs  Pharmacies used for CF medications:   Specialty Pharmacy:  [facility] [place])  [facility] ([facility])  Local Pharmacy: [facility] in [place] (non-specialty meds, Creon, HTS 7%)    FOLLOW UP LAB(S)   DEXA (due now - can get locally or at next in-person visit)  A1c - follow up with endo in Aug    PLAN     Restart colistin  Will also be starting PO antibiotics for potential exacerbation    I spent a total of 20 minutes face to face with the patient delivering clinical care and providing education/counseling. Medications reviewed in EPIC medication station and updated today by the clinical pharmacist practitioner.     Recommendations and medication-related problems were discussed directly with pulmonologist, Cleotis Lema) [name], MD.    Electronically signed:  Alben Spittle, PharmD, BCACP, CPP  Clinical Pharmacist Practitioner  [facility] Adult Cystic Fibrosis/Pulmonary Clinic  [phone]      I am located on-site and the patient is located on-site for this visit.

## 2022-01-25 NOTE — Unmapped (Unsigned)
Sputum collected per orders. Sample labeled in front of patient. Tubed to the lab.    [name], RN  CF Nurse Coordinator   [phone]

## 2022-01-25 NOTE — Unmapped (Signed)
[facility]  Department of Audiology    AUDIOLOGIC EVALUATION REPORT     PATIENT: [name], [name]  DOB: October 05, 1985  MRN: [id number]  DOS: 01/25/2022    PLEASE SEE AUDIOGRAM INCLUDING FULL REPORT IN MEDIA MANAGER TAB.    HISTORY     [Pain 0/10]  Christian Young is a 35 y.o. individual seen by Audiology for a hearing evaluation in conjunction with ENT visit; patient is seeing Dr. Junius Finner today. Patient was accompanied to today's appointment.  Today, patient reports a gradual decline in hearing with no fluctuations.  He describes steady bilateral tinnitus.  Positive history of noise exposure (worked in a warehouse for 2 years).   He denies aural fullness and recent ear surgeries.     RESULTS     Otoscopy  RIGHT Ear: clear external auditory canal  LEFT Ear: clear external auditory canal    Tympanometry - using a 226 Hz probe tone  RIGHT Ear: Type As tympanogram, consistent with normal middle ear pressure and volume and reduced middle ear compliance  LEFT Ear: Type As tympanogram, consistent with normal middle ear pressure and volume and reduced middle ear compliance    Pure Tone and Speech Audiometry  Today's behavioral evaluation was completed using conventional audiometry via insert earphones and headphones. Speech reception thresholds in DISAGREEMENT under both types of transducers.    Initial responses using insert transduces with RIGHT SRT 25 and LEFT SRT 15:          Final responses after re-instruction and switching to headphones.      RIGHT Ear: Mild to moderate  sensorineural hearing loss  ??? Speech Recognition Threshold (SRT): 5 dB HL  ??? Word Recognition Score (Recorded NU-6): 92% at 45 dB HL    LEFT Ear: Normal hearing sensitivity sloping to moderate sensorineural hearing loss  ??? Speech Recognition Threshold (SRT): 5 dB HL  ??? Word Recognition Score (Recorded NU-6): 100% at 45 dB HL    IMPRESSIONS     Patient was counseled on today's results and expressed understanding.  Confirmation of thresholds will need to be completed prior to intervention.    RECOMMENDATIONS     ??? ENT - Seeing Dr. Ralene Ok today  ??? Re-evaluate hearing per ENT request, sooner should concerns arise    Care Provider(s) wore appropriate PPE throughout entire appointment.     Procedure(s):  ???  CPT [id number] - Comprehensive Audio Eval & Speech Recognition  ???  CPT E974542 - Tympanometry     Visit Time: 45 min    Ewart Carrera R Dorman Calderwood, AuD  Clinical Audiologist  [facility] Adult Audiology Program  Scheduling [phone]

## 2022-01-25 NOTE — Unmapped (Signed)
Otolaryngology Clinic Note    Christian Young is a 36 y.o. male is seen in consultation at the request of None Per Patient Pcp  for evaluation of sinonasal complaints      History of Present Illness:     The patient is a 36 y.o. male who  has a past medical history of Anxiety, Asthma, Chronic pain disorder, Cystic fibrosis (CMS-HCC), Depression, Diabetes mellitus (CMS-HCC), Hypertension, Lumbar radiculopathy (10/26/2020), and Nonproductive cough (04/05/2018). who presents for the evaluation of sinonasal complaints.    Today he reports that he has previously had sinus surgery somewhere between 2014 and 2016. He endorses general sinus issues. He endorses sinus pressure, drainage, congestion, and ear pain. He denies change in sense of smell. His wife reports that he will often need antibiotics for sinus infections multiple times a year. He gets mild relief with antibiotics. He has not been on steroids because he has diabetes and his kidneys struggle. He reports near daily headaches. His symptoms have worsened over the past two years and he endorses seasonal symptoms as well. He uses Flonase daily, he takes Xyzal regularly. He denies previous allergy testing. He has been on Trikafta since 2020. He reports that for a long time the Trikafta gave him a lot of relief. He was septic in December and he feels like he has been trying to play catch up since then. He reports that his lung function is decent. His last hospitalization was in march and lasted 2 weeks. 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 He reports hearing loss, he was previously on Tobramycin but has become severely allergic and ceased. He has recently been on Ciprofloxacin for a kidney infection.     HX OF DVT's on Rivaroxaban 20 mg daily.     Patients medical records were personally reviewed.      A 12 point review of systems was negative except as indicated.  The patient denies fevers, chills, shortness of breath, chest pain, nausea, vomiting, diarrhea, inability to lie flat, dysphagia, odynophagia, hemoptysis, hematemesis, changes in vision, changes in voice quality, otalgia, otorrhea, vertiginous symptoms, focal deficits, or other concerning symptoms.    Past Medical History     has a past medical history of Anxiety, Asthma, Chronic pain disorder, Cystic fibrosis (CMS-HCC), Depression, Diabetes mellitus (CMS-HCC), Hypertension, Lumbar radiculopathy (10/26/2020), and Nonproductive cough (04/05/2018).    Past Surgical History     has a past surgical history that includes pr removal of lung,lobectomy (Right, 03/29/2017); IR Insert Port Age Greater Than 5 Years (03/27/2019); and Sinus surgery.    Current Medications    Current Outpatient Medications   Medication Sig Dispense Refill    acetaminophen (TYLENOL) 500 MG tablet Take 1 tablet (500 mg total) by mouth every six (6) hours as needed. 30 tablet 0    albuterol (PROVENTIL HFA;VENTOLIN HFA) 90 mcg/actuation inhaler Inhale 2 puffs every six (6) hours as needed. 1 Inhaler 1    albuterol 2.5 mg /3 mL (0.083 %) nebulizer solution Inhale 3 mL (2.5 mg total) by nebulization two (2) times a day. 540 mL 3    alcohol swabs (ALCOHOL PREP PADS) PadM Use as directed with inhaled antibiotics 100 each 11    amitriptyline (ELAVIL) 100 MG tablet TAKE ONE TABLET BY MOUTH AT BEDTIME (Patient taking differently: Take 1 tablet (100 mg total) by mouth nightly.) 30 tablet 1    atorvastatin (LIPITOR) 40 MG tablet Take 1 tablet (40 mg total) by mouth nightly.      blood sugar  diagnostic (GLUCOSE BLOOD) Strp by Other route Three (3) times a day with a meal. Rx sent to [facility] 01/04/20      blood-glucose meter kit Dispense meter that is preferred by patient's insurance company 1 each 0    blood-glucose meter,continuous (DEXCOM G6 RECEIVER) Misc Use as directed 1 each 0    blood-glucose sensor (DEXCOM G6 SENSOR) Devi Use sq as directed every 10 days 9 each 11    blood-glucose transmitter (DEXCOM G6 TRANSMITTER) Devi Use as directed, replace every 90 days 1 each 11    budesonide-formoteroL (SYMBICORT) 160-4.5 mcg/actuation inhaler Inhale 2 puffs Two (2) times a day. 30.6 g 3    EASY TOUCH LANCING DEVICE Misc Use as directed.      EASY TOUCH TWIST LANCETS 30 gauge Misc Use as directed.      elexacaftor-tezacaftor-ivacaft (TRIKAFTA) 100-50-75 mg(d) /150 mg (n) tablet Take 2 Tablets (orange) by mouth in the morning and 1 tablet (blue) in the evening with fatty food 84 tablet 5    empty container Misc Use as directed to dispose of syringes 1 each 0    FLUoxetine 60 mg Tab Take 60 mg by mouth daily.       fluticasone propionate (FLONASE) 50 mcg/actuation nasal spray 2 sprays into each nostril daily.      FREESTYLE LIBRE 14 DAY SENSOR kit Use 3-4 times daily      gabapentin (NEURONTIN) 600 MG tablet Take 1 tablet (600 mg total) by mouth Three (3) times a day.      insulin ASPART (NOVOLOG U-100 INSULIN ASPART) 100 unit/mL injection Subcutaneously infuse up to 150 units daily via insulin pump 150 mL 11    insulin pump cart,automated,BT (OMNIPOD 5 G6 PODS, GEN 5,) Crtg 1 each by subcutaneous (via wearable injector) route every three (3) days. Change every 72 hour 10 each 12    lamoTRIgine (LAMICTAL) 200 MG tablet Take 2 tablets (400 mg total) by mouth daily.      levocetirizine (XYZAL) 5 MG tablet Take 1 tablet (5 mg total) by mouth every evening. 90 tablet 3    lipase-protease-amylase (CREON) 36,000-114,000- 180,000 unit CpDR Take 8 caps by mouth with meals three times daily and 4 caps with snacks up to three times a day.      lisinopriL (PRINIVIL,ZESTRIL) 10 MG tablet Take 1 tablet (10 mg total) by mouth daily. 30 tablet 0    melatonin 10 mg Tab Take 1 tablet (10 mg total) by mouth nightly as needed.      montelukast (SINGULAIR) 10 mg tablet Take 1 tablet (10 mg total) by mouth nightly. 90 tablet 3    MVW COMPLETE FORMUL PROBIOTIC 40 billion cell -15 mg CpDR Take 1 capsule by mouth daily. (Patient taking differently: Take 1 capsule by mouth in the morning.) 90 capsule 3    MVW Complete, pediatric multivit 61-D3-vit K, (MVW COMPLETE FORMULATION) 1,500-800 unit-mcg cap Take 2 capsules by mouth Two (2) times a day.      nebulizers (LC PLUS) Misc use as directed with nebulized medications twice daily. 4 each 4    nebulizers (PARI LC D NEBULIZER) Misc Use with inhaled medication. 1 each 6    nebulizers Misc Use as directed with inhaled medications 4 each 3    omeprazole (PRILOSEC) 20 MG capsule Take 1 capsule (20 mg total) by mouth daily. 90 capsule 3    pen needle, diabetic (BD ULTRA-FINE NANO PEN NEEDLE) 32 gauge x 5/32 (4 mm) Ndle use up to  4 times daily 400 each 12    pramipexole (MIRAPEX) 0.125 MG tablet Take 2 tablets (0.25 mg total) by mouth nightly.      PULMOZYME 1 mg/mL nebulizer solution Inhale 2.5 mg daily. 225 mL 3    sodium chloride 7% 7 % Nebu Inhale 4 mL by nebulization two (2) times a day.      sterile water Soln Use 2 mL to mix colistin, then add another 1 mL SWFI into neb cup along with mixed colistin. 600 mL 6    syringe with needle (BD LUER-LOK SYRINGE) 3 mL 21 gauge x 1 Syrg For use with inhaled antibiotic 60 each 6    tamsulosin (FLOMAX) 0.4 mg capsule Take 1 capsule (0.4 mg) by mouth daily 30 minutes after the same meal each day as needed for kidney stone      traMADoL (ULTRAM) 50 mg tablet Take 1 tablet (50 mg) by mouth every 6 hours as needed for kidney stone pain      traZODone (DESYREL) 150 MG tablet Take 1 tablet (150 mg total) by mouth nightly.      XARELTO 20 mg tablet Take 1 tablet (20 mg total) by mouth daily.       No current facility-administered medications for this visit.       Allergies    Allergies   Allergen Reactions    Aztreonam Anaphylaxis, Hives, Nausea And Vomiting and Rash     fevers  Patient stated that he only vomited x1 with [place] in the past.      fevers  Patient stated that he only vomited x1 with [place] in the past.      Baruch Goldmann Lysine] Anaphylaxis    Cefepime Itching, Nausea Only and Other (See Comments)     Headaches also    Other Anaphylaxis and Other (See Comments)     Other reaction(s): Other (See Comments)  Bananas: itchy throat  Slo Bid record from [employer] states anaphylaxis.  Pt states this was from childhood and does not know reaction.  Bananas, causes itchy throat    Slo-Bid 100 Anaphylaxis    Tobramycin Tinnitus     From OSH record-documented as tinnitus but has received IV tobra with close monitoring.  Pt has received Tobramycin at [facility] since this allergy documented    From OSH record-documented as tinnitus but has received IV tobra with close monitoring.    Banana Itching and Nausea And Vomiting       Family History    Negative for bleeding disorders or free bleeding.     family history includes Bipolar disorder in his mother; Depression in his mother.    Social History:     reports that he has quit smoking. His smoking use included e-cigarettes. He has never used smokeless tobacco.   reports that he does not currently use alcohol after a past usage of about 3.0 standard drinks per week.   reports no history of drug use.    Review of Systems    A 12 system review of systems was performed and is negative other than that noted in the history of present illness.    Vital Signs  Temperature 36.6 ??C (97.9 ??F), resp. rate 20, height 182.9 cm (6'), weight (!) 107.5 kg (237 lb).    SNOT-22 today was 74    Physical Exam    General: Well-developed, well-nourished. Appropriate, comfortable, and in no apparent distress.  Head/Face: On external examination there is no obvious asymmetry or scars. On  palpation there is no tenderness over maxillary sinuses or masses within the salivary glands. Cranial nerves V and VII are intact through all distributions.  Eyes: PERRL, EOMI, the conjunctiva are not injected and sclera is non-icteric.  Ears: On external exam, there is no obvious lesions or asymmetry. The EACs are bilaterally without lesions. The TMs are in the neutral position and are mobile to pneumatic otoscopy bilaterally. There are no middle ear masses or fluid noted. Hearing is grossly intact bilaterally. Impacted cerumen removed bilaterally.   Nose: On external exam there are neither lesions nor asymmetry of the nasal tip/ dorsum. On anterior rhinoscopy, visualization posteriorly is limited on anterior examination. For this reason, to adequately evaluate posteriorly for masses, polypoid disease and/or signs of infections, nasal endoscopy is indicated (see procedure below).  Oral cavity/oropharynx: The mucosa of the lips, gums, hard and soft palate, posterior pharyngeal wall, tongue, floor of mouth, and buccal region are without masses or lesions and are normally hydrated. Good dentition. Tongue protrudes midline. Tonsils are normal appearing. Supraglottis not visualized due to gag reflex. Mildly enlarged tonsils.   Neck: There is no asymmetry or masses. Trachea is midline. There is no enlargement of the thyroid or palpable thyroid nodules.   Lymphatics: There is no palpable lymphadenopathy along the jugulodiagastric, submental, or posterior cervical chains.  Chest: No audible wheeze, unlabored respirations.  Cardiovascular: Regular rate.  GI: Nondistended.  Neurologic: Cranial nerve???s II-XII are grossly intact. Exam is non-focal.  Extremities: No cyanosis, clubbing or edema.    Procedures:  Diagnostic Bilateral Nasal Endoscopy performed on 01/25/2022 (CPT 31231)    NOTE: Nasal endoscopy is performed for the sinuses only, and not for examination of the skull base, septum or inferior turbinates, nor is it related to any previously performed septoplasty or inferior turbinate surgery or skull base surgery.     Surgeon: Egbert Garibaldi, MD  Anesthesia: none  Procedure Detail:  As a result of inability to visualize the intranasal anatomy, and after discussion of the potential risks related to the procedure (primarily bleeding), a endoscope is used to examine the left and right sinonasal cavities, including the interior of the nasal cavity and the middle and superior meatus, the turbinates, and the spheno-ethmoid recess. All these areas were inspected.    Findings:    Examination on the left reveals an intact nasal septum with no associated masses, lesions, or friable mucosa. Evidence of previous sinus surgery. Inferior turbinate hypertrophy. Evidence of previous surgery, maxillary is open with polypoid edema, partial ethmoid, middle turbinate still in place.    Examination on the right reveals an intact nasal septum with no associated masses, lesions, or friable mucosa. Evidence of previous sinus surgery. Inferior turbinate hypertrophy. Evidence of previous surgery, maxillary is open with polypoid edema, partial ethmoid, middle turbinate still in place.      Oretha Ellis Nasal Endoscopy Score: [name] is used to assess the degree of inflammation of the sinonasal structures, including the middle and superior turbinates, the ethmoid sinuses, maxillary sinuses, frontal sinuses, and sphenoid sinuses.  In the presence of previous surgery, some or all of these structures may be absent.    Left        Polyps:  Absent (0)   Edema:   Mild (1)   Discharge:  Clear, Thin (1)    Scarring:  Mild (1)   Crusting:  Mild (1)      Total Left:  4     Right  Polyps:  Absent (0)   Edema:  Mild (1)   Discharge: Clear, Thin (1)    Scarring:  Mild (1)   Crusting:  Mild (1)      Total Right:   4      Labs and Diagnostic Tests  None      Assessment:  The patient is a 36 y.o. male who  has a past medical history of Anxiety, Asthma, Chronic pain disorder, Cystic fibrosis (CMS-HCC), Depression, Diabetes mellitus (CMS-HCC), Hypertension, Lumbar radiculopathy (10/26/2020), and Nonproductive cough (04/05/2018). 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 Mr. Pritchard has inflammation in the nasal cavity noted on nasal endoscopy exam today in clinic. We have extensively discussed treatment options. The patient will start on a nasal hygiene regimen which includes BID isotoninc nasal irrigation and nasal steroid sprays (2 sprays BID each nostril). We have discussed the proper positioning for optimal use of the nasal steroid.     2. He has been on Tobramycin a few times so it is possible that he has hearing loss.He should get his Audiogram as scheduled later today.     3. Considering his headaches and scarring in his nose, I will order a CT for further visualization of his sinuses. It is likely that his frontal sinus is blocked off and may need surgical management. If he is admitted, it would be great to obtain a CT sinus - during the hospitalization. If admitted to [facility] would consider trying to add him on.  Would need to be of anticoagulation to stop perioperatively as this would significantly increase the risk of surgery.      Follow-up will be scheduled in 3-4 months to assess the response to medical therapy. Should medical therapy fail we plan to discuss other treatment options.    The patient's physical examination findings including flexible fiberoptic nasopharyngolaryngosocpy/sinonasal endoscopy were thoroughly discussed.    The patient voiced complete understanding of plan as detailed above and is in full agreement.    Scribe's Attestation: Egbert Garibaldi, MD, PHD, obtained and performed the history, physical exam and medical decision making elements that were entered into the chart. Signed by Jacques Navy, Scribe, on January 25, 2022 at 11:07 AM.    ----------------------------------------------------------------------------------------------------------------------  January 25, 2022 12:40 PM. Documentation assistance provided by the Scribe. I was present during the time the encounter was recorded. The information recorded by the Scribe was done at my direction and has been reviewed and validated by me.  ----------------------------------------------------------------------------------------------------------------------

## 2022-01-26 MED FILL — BD LUER-LOK SYRINGE 3 ML 21 GAUGE X 1": 30 days supply | Qty: 60 | Fill #1

## 2022-01-26 MED FILL — WATER FOR INJECTION, STERILE INJECTION SOLUTION: 30 days supply | Qty: 600 | Fill #1

## 2022-01-26 NOTE — Unmapped (Signed)
Spoke to [place] on 01/26/22 about his test results. RPP was positive for Rhinovirus. His labs (CBC and CRP level) were normal and chest x-ray was without any new changes.     Recommended to continue with the plan we made yesterday - Levaquin x 14 days, Colistin inhaled BID x 28 days, continue his usual CF medications and increase airway clearance.     Patient was agreeable. Advised to contact us if his symptoms changed.     Karl Ito, [place]  [facility]   [phone]

## 2022-01-26 NOTE — Unmapped (Signed)
[facility] Clinical Assessment & Refill Coordination Note    Tait Balistreri, [place]: [birth year]  Phone: [phone] (home)     All above HIPAA information was verified with patient.     Was a Nurse, learning disability used for this call? No    Specialty Medication(s):   CF/Pulmonary/Asthma: -Nebulized colistin (150mg  vials) and supply kit  -Trikafta 100-50-75mg      Current Outpatient Medications   Medication Sig Dispense Refill    acetaminophen (TYLENOL) 500 MG tablet Take 1 tablet (500 mg total) by mouth every six (6) hours as needed. 30 tablet 0    albuterol (PROVENTIL HFA;VENTOLIN HFA) 90 mcg/actuation inhaler Inhale 2 puffs every six (6) hours as needed. 1 Inhaler 1    albuterol 2.5 mg /3 mL (0.083 %) nebulizer solution Inhale 3 mL (2.5 mg total) by nebulization two (2) times a day. 540 mL 3    alcohol swabs (ALCOHOL PREP PADS) PadM Use as directed with inhaled antibiotics 100 each 11    amitriptyline (ELAVIL) 100 MG tablet TAKE ONE TABLET BY MOUTH AT BEDTIME (Patient taking differently: Take 1 tablet (100 mg total) by mouth nightly.) 30 tablet 1    atorvastatin (LIPITOR) 40 MG tablet Take 1 tablet (40 mg total) by mouth nightly.      blood sugar diagnostic (GLUCOSE BLOOD) Strp by Other route Three (3) times a day with a meal. Rx sent to [facility] 01/04/20      blood-glucose meter kit Dispense meter that is preferred by patient's insurance company 1 each 0    blood-glucose meter,continuous (DEXCOM G6 RECEIVER) Misc Use as directed 1 each 0    blood-glucose sensor (DEXCOM G6 SENSOR) Devi Use sq as directed every 10 days 9 each 11    blood-glucose transmitter (DEXCOM G6 TRANSMITTER) Devi Use as directed, replace every 90 days 1 each 11    budesonide-formoteroL (SYMBICORT) 160-4.5 mcg/actuation inhaler Inhale 2 puffs Two (2) times a day. 30.6 g 3    colistimethate (COLYMYCIN) 150 mg injection Inhale 2 mL (150 mg total) Two (2) times a day, alternating 28 days on and 28 days off. Inject 2mL SWFI into colistin vial & gently mix, then withdraw 2mL (150mg ) for dose. 120 mL 6    colistimethate (COLYMYCIN) 150 mg injection Inhale 2 mL (150 mg total) Two (2) times a day, alternating 28 days on and 28 days off. Inject 2mL SWFI into colistin vial & gently mix, then withdraw 2mL (150mg ) for dose. 120 mL 5    EASY TOUCH LANCING DEVICE Misc Use as directed.      EASY TOUCH TWIST LANCETS 30 gauge Misc Use as directed.      elexacaftor-tezacaftor-ivacaft (TRIKAFTA) 100-50-75 mg(d) /150 mg (n) tablet Take 2 Tablets (orange) by mouth in the morning and 1 tablet (blue) in the evening with fatty food 84 tablet 5    empty container Misc Use as directed to dispose of syringes 1 each 0    FLUoxetine 60 mg Tab Take 60 mg by mouth daily.       fluticasone propionate (FLONASE) 50 mcg/actuation nasal spray 2 sprays into each nostril daily. (Patient taking differently: 1 spray into each nostril daily.)      FREESTYLE LIBRE 14 DAY SENSOR kit Use 3-4 times daily      gabapentin (NEURONTIN) 600 MG tablet Take 1 tablet (600 mg total) by mouth Three (3) times a day.      insulin ASPART (NOVOLOG U-100 INSULIN ASPART) 100 unit/mL injection Subcutaneously  infuse up to 150 units daily via insulin pump 150 mL 11    insulin pump cart,automated,BT (OMNIPOD 5 G6 PODS, GEN 5,) Crtg 1 each by subcutaneous (via wearable injector) route every three (3) days. Change every 72 hour 10 each 12    lamoTRIgine (LAMICTAL) 200 MG tablet Take 2 tablets (400 mg total) by mouth daily.      levocetirizine (XYZAL) 5 MG tablet Take 1 tablet (5 mg total) by mouth every evening. 90 tablet 3    levoFLOXacin (LEVAQUIN) 750 MG tablet Take 1 tablet (750 mg total) by mouth daily for 14 days. 14 tablet 0    lipase-protease-amylase (CREON) 36,000-114,000- 180,000 unit CpDR Take 8 caps by mouth with meals three times daily and 4 caps with snacks up to three times a day.      lisinopriL (PRINIVIL,ZESTRIL) 10 MG tablet Take 1 tablet (10 mg total) by mouth daily. 30 tablet 0    melatonin 10 mg Tab Take 1 tablet (10 mg total) by mouth nightly as needed. (Patient not taking: Reported on 01/25/2022)      montelukast (SINGULAIR) 10 mg tablet Take 1 tablet (10 mg total) by mouth nightly. 90 tablet 3    MVW COMPLETE FORMUL PROBIOTIC 40 billion cell -15 mg CpDR Take 1 capsule by mouth daily. (Patient taking differently: Take 1 capsule by mouth in the morning.) 90 capsule 3    MVW Complete, pediatric multivit 61-D3-vit K, (MVW COMPLETE FORMULATION) 1,500-800 unit-mcg cap Take 2 capsules by mouth Two (2) times a day. (Patient taking differently: Take 2 capsules by mouth daily.)      nebulizers (LC PLUS) Misc use as directed with nebulized medications twice daily. 4 each 4    nebulizers (PARI LC D NEBULIZER) Misc Use with inhaled medication. 1 each 6    nebulizers Misc Use as directed with inhaled medications 4 each 3    omeprazole (PRILOSEC) 20 MG capsule Take 1 capsule (20 mg total) by mouth daily. 90 capsule 3    pen needle, diabetic (BD ULTRA-FINE NANO PEN NEEDLE) 32 gauge x 5/32 (4 mm) Ndle use up to 4 times daily 400 each 12    pramipexole (MIRAPEX) 0.125 MG tablet Take 2 tablets (0.25 mg total) by mouth nightly.      PULMOZYME 1 mg/mL nebulizer solution Inhale 2.5 mg daily. 225 mL 3    sodium chloride 7% 7 % Nebu Inhale 4 mL by nebulization two (2) times a day.      sterile water Soln Use 2 mL to mix colistin, then add another 1 mL SWFI into neb cup along with mixed colistin. 600 mL 6    syringe with needle (BD LUER-LOK SYRINGE) 3 mL 21 gauge x 1 Syrg For use with inhaled antibiotic 60 each 6    traZODone (DESYREL) 150 MG tablet Take 1 tablet (150 mg total) by mouth nightly.      XARELTO 20 mg tablet Take 1 tablet (20 mg total) by mouth daily.       No current facility-administered medications for this visit.        Changes to medications: Bridger reports no changes at this time.    Allergies   Allergen Reactions    Aztreonam Anaphylaxis, Hives, Nausea And Vomiting and Rash fevers  Patient stated that he only vomited x1 with [place] in the past.      fevers  Patient stated that he only vomited x1 with [place] in the past.      Baruch Goldmann  Lysine] Anaphylaxis    Cefepime Itching, Nausea Only and Other (See Comments)     Headaches also    Other Anaphylaxis and Other (See Comments)     Other reaction(s): Other (See Comments)  Bananas: itchy throat  Slo Bid record from [employer] states anaphylaxis.  Pt states this was from childhood and does not know reaction.  Bananas, causes itchy throat    Slo-Bid 100 Anaphylaxis    Tobramycin Tinnitus     From OSH record-documented as tinnitus but has received IV tobra with close monitoring.  Pt has received Tobramycin at [facility] since this allergy documented    From OSH record-documented as tinnitus but has received IV tobra with close monitoring.    Banana Itching and Nausea And Vomiting       Changes to allergies: No    SPECIALTY MEDICATION ADHERENCE     Trikafta 100-50-75 mg: 7 days of medicine on hand   Colistin 150 mg: 0 days of medicine on hand       Medication Adherence    Patient reported X missed doses in the last month: 0  Specialty Medication: Trikafta 100-50-75mg   Patient is on additional specialty medications: Yes  Additional Specialty Medications: Colistin 150 mg  Patient Reported Additional Medication X Missed Doses in the Last Month: >5  Patient is on more than two specialty medications: No  Any gaps in refill history greater than 2 weeks in the last 3 months: no  Demonstrates understanding of importance of adherence: yes  Informant: patient  Reliability of informant: fairly reliable  Support network for adherence: family member          Specialty medication(s) dose(s) confirmed: Regimen is correct and unchanged.     Are there any concerns with adherence? Yes: [name] reports last dose of colistin was sometime in March.    Adherence counseling provided? Yes: We discussed the importance of adhering to 28 days on/off dosing for colistin and keeping a calendar to keep track of cycles. He verbalized understanding and stated he will use the calendar on his phone to better keep track of dosing schedule.    CLINICAL MANAGEMENT AND INTERVENTION      Clinical Benefit Assessment:    Do you feel the medicine is effective or helping your condition? Yes    Clinical Benefit counseling provided? Not needed    Adverse Effects Assessment:    Are you experiencing any side effects? No    Are you experiencing difficulty administering your medicine? No    Quality of Life Assessment:    Quality of Life    Rheumatology  Oncology  Dermatology  Cystic Fibrosis          How many days over the past month did your cystic fibrosis  keep you from your normal activities? For example, brushing your teeth or getting up in the morning. Mr. Gorelik states he is not feeling well which he reported to provider.    Have you discussed this with your provider? Yes    Acute Infection Status:    Acute infections noted within Epic:  MDR Pseudomonas, CF Patient, Rhinovirus  Patient reported infection:  Cough - patient reported to provider    Therapy Appropriateness:    Is therapy appropriate and patient progressing towards therapeutic goals? Yes, therapy is appropriate and should be continued    DISEASE/MEDICATION-SPECIFIC INFORMATION      For CF patients: CF [facility] Active? Yes    PATIENT SPECIFIC NEEDS     Does  the patient have any physical, cognitive, or cultural barriers? No    Is the patient high risk? No    Does the patient require a Care Management Plan? No     SOCIAL DETERMINANTS OF HEALTH     At the [facility], we have learned that life circumstances - like trouble affording food, housing, utilities, or transportation can affect the health of many of our patients.   That is why we wanted to ask: are you currently experiencing any life circumstances that are negatively impacting your health and/or quality of life? No    Social Determinants of Conservation officer, historic buildings Strain: Low Risk     Difficulty of Paying Living Expenses: Not hard at all   Internet Connectivity: Not on file   Food Insecurity: Food Insecurity Present    Worried About Programme researcher, broadcasting/film/video in the Last Year: Never true    Barista in the Last Year: Sometimes true   Tobacco Use: Medium Risk    Smoking Tobacco Use: Former    Smokeless Tobacco Use: Never    Passive Exposure: Not on file   Housing/Utilities: Low Risk     Within the past 12 months, have you ever stayed: outside, in a car, in a tent, in an overnight shelter, or temporarily in someone else's home (i.e. couch-surfing)?: No    Are you worried about losing your housing?: No    Within the past 12 months, have you been unable to get utilities (heat, electricity) when it was really needed?: No   Alcohol Use: Not on file   Transportation Needs: No Transportation Needs    Lack of Transportation (Medical): No    Lack of Transportation (Non-Medical): No   Substance Use: Not on file   Health Literacy: Low Risk     : Never   Physical Activity: Not on file   Interpersonal Safety: Not on file   Stress: Not on file   Intimate Partner Violence: Not on file   Depression: Not on file   Social Connections: Not on file       Would you be willing to receive help with any of the needs that you have identified today? Not applicable       SHIPPING     Specialty Medication(s) to be Shipped:   CF/Pulmonary/Asthma: -Nebulized colistin (150mg  vials) and supply kit  -Trikafta 100-50-75mg     Other medication(s) to be shipped: No additional medications requested for fill at this time     Changes to insurance: No    Delivery Scheduled: Yes, Expected medication delivery date: 01/29/22.     Medication will be delivered via UPS to the confirmed prescription address in [facility].    The patient will receive a drug information handout for each medication shipped and additional FDA Medication Guides as required.  Verified that patient has previously received a Conservation officer, historic buildings and a Surveyor, mining.    The patient or caregiver noted above participated in the development of this care plan and knows that they can request review of or adjustments to the care plan at any time.      All of the patient's questions and concerns have been addressed.    Oliva Bustard   [facility] Specialty Pharmacist

## 2022-01-26 NOTE — Unmapped (Addendum)
Thank you for allowing [name] to be a part of your care    Goals and plans we discussed today:  Start Levaquin 750 mg daily for 14 days  Restart Colistin twice daily for 28 days  Increase airway clearance  Follow up with Dr. [name] on 03/27/22 as scheduled     NEXT BREATH presented by the [employer]  Don't miss NextBreath, a free, two-day virtual event June 13-14 for people living with CF and their family members to connect with others and share your experiences.    NextBreath 2023 ([email or web address])    Attending NextBreath provides you with the opportunity to engage with peers via small group discussions, one-to-one conversations, and panel discussions. Each day of the Conference will focus on different lung health journeys and experiences, please review the agenda to attend the day(s) and session(s) that are most valuable to you.        To reach your CF nurse coordinators:  Patients with the last name A-K: Joni Reining [phone]  Patients with the last name L-Z: Harriett Sine [phone]     For urgent issues after hours/weekends:  Hospital Operator: [phone]) [phone], ask for Pulmonary Fellow on-call     To make or change a clinic appointment:   [facility]: [phone]     When you should use MyChart:   View test results  Send a non-urgent message or update to the care team  View after-visit summaries   See or pay bills      When you should call your nurse coordinator (NOT use MyChart)   Feeling sick or have a fever   Increase in cough   Change in amount of mucus or change mucus color/thickness  Coughing up blood or blood-tinged mucus  Chest pain  Shortness of breath   Lack of energy/Poor Appetite

## 2022-01-27 NOTE — Unmapped (Signed)
Pulmonary Clinic - Follow-up Visit    ASSESSMENT     Christian Young is a 36 y.o. male with cystic fibrosis [phone] and [phone] insertion) who presents to clinic for routine CF follow-up. He reports feeling poorly for the past week with some systemic symptoms and increased productive cough. Reassuringly, his FEV1 is increased to 75%. He is Rhinovirus positive. His labs (CBC w/ diff and CRP level) are normal and chest x-ray shows no new abnormalities. Plan for outpatient treatment with PO Levaquin and inhaled Colistin. He should increase his airway clearance as well.     Problem List Items Addressed This Visit        Digestive    Pancreatic insufficiency due to cystic fibrosis (CMS-HCC)   Other Visit Diagnoses     Cystic fibrosis (CMS-HCC)    -  Primary    Relevant Medications    albuterol 2.5 mg /3 mL (0.083 %) nebulizer solution    levoFLOXacin (LEVAQUIN) 750 MG tablet    colistimethate (COLYMYCIN) 150 mg injection    Other Relevant Orders    Respiratory Pathogen Panel with COVID-19 (Completed)    CBC w/ Differential (Completed)    C-reactive protein (Completed)    XR Chest 2 views (Completed)    CF Sputum/ CF Sinus Culture (Completed)    AFB culture    AFB SMEAR (Completed)          PLAN     1) Collected CF sputum and AFB cultures  2) Collected labs (CBC w/ diff, CRP - normal), chest x-ray (no new changes), and RPP (Rhinovirus positive)  3) Start Levaquin x 14 days   4) Restart inhaled Colistin BID x 28 days and continue every other month   5) Continue Trikafta - LFT's annually (last checked 11/2021)   6) Continue home CF inhaled medications, may increase Hypertonic Saline and Vest to TID while not feeling well   7) For his CF sinus disease and hearing loss, he was seen by [facility] today - they are considering sinus surgery and a CT scan is planned for 03/2022   8) For his pancreatic insufficiency, continue Creon and MVW vitamins   9) For his CF-related diabetes, he follows with [facility] and uses an Omnipod    10) He received a Flu vaccine and Bivalent COVID vaccine last year   34) For his anxiety and depression, he follows with his PCP: he was seen by the CF social worker, he would benefit from psychiatry and therapy services   12) CF pharmacist did a medication reconciliation for his other medications     The patient will return to clinic in 2 months to see Dr. [name], or sooner as needed.     I personally spent 50 minutes face-to-face and non-face-to-face in the care of this patient, which includes all pre, intra, and post visit time on the date of service.  All documented time was specific to the E/M visit and does not include any procedures that may have been performed.    Karl Ito, [place]  [facility]   [phone]    HISTORY:     Interval History:  Diagnosed at 54 with chronic infections, had consolidation and parapneumonic effusion,sent to [facility] for treatment since then.   [id number] and [id number] insertion. 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    Christian Young presents to clinic today for routine CF follow-up. He hasn't been feeling well for the past  1 week. He reports fever to 101 yesterday, fatigue, and night sweats. He has increased cough with green sputum production. He has chest tightness and wheezing. He denies sore throat, sinus congestion, chest pain or shortness of breath. He reports using his Albuterol and Hypertonic Saline twice daily and Pulmozyme daily at home. He used Colistin for a month in March but none since then. He was seen by his PCP recently for a possible kidney infection and was treated with Cipro, Flomax, and Tramadol.     He reports poor appetite and has had unintentional weight loss. He denies abdominal pain, nausea, vomiting, diarrhea or constipation. He takes his enzymes regularly. He takes 2 MVW D5000 vitamins twice a day and MVW probiotic daily.     He and his wife are living in a house on her sister's property in [place]. They now have a dog named Moose. His wife works as an Museum/gallery exhibitions officer and works night shift frequently. [name] has not been working. This has made his mood worse (anxiety and depression). He feels more productive when he is working, but he hasn't been able to find a job. His PCP is managing his psych meds but hasn't made any adjustments. He previously saw a psychiatrist. He is no longer seeing a therapist since he missed too many appointments due to hospitalizations.     Cystic fibrosis issues:    Sinuses: Uses Flonase and takes Claritin and Singulair. No congestion, drainage or pressure currently.     Airway clearance: Does albuterol, and 7% hypertonic saline twice a day.  Reports compliance to it.  Does Pulmozyme daily.  Uses the vest occasionally.  Hasn't used Colistin since March.     GI and nutrition: Denies any constipation or diarrhea. Reports compliance to his Creon.  Lost about 30 lbs a few years ago.     Hypertension: On lisinopril 10 mg daily.    Diabetes: Followed and managed by endocrinology.  Now uses Omnipod.     Mental health issues: PCP prescribing psych medications. No longer seeing a therapist.     Past Medical History:   Diagnosis Date   ??? Anxiety    ??? Asthma    ??? Chronic pain disorder    ??? Cystic fibrosis (CMS-HCC)    ??? Depression    ??? Diabetes mellitus (CMS-HCC)    ??? Hypertension    ??? Lumbar radiculopathy 10/26/2020   ??? Nonproductive cough 04/05/2018     Past Surgical History:   Procedure Laterality Date   ??? IR INSERT PORT AGE GREATER THAN 5 YRS  03/27/2019    IR INSERT PORT AGE GREATER THAN 5 YRS 03/27/2019 Rush Barer, MD IMG [facility]   ??? PR REMOVAL OF LUNG,LOBECTOMY Right 03/29/2017    Procedure: REMOVAL OF LUNG, OTHER THAN PNEUMONECTOMY; SINGLE LOBE (LOBECTOMY);  Surgeon: Cherie Dark, MD;  Location: MAIN OR [facility];  Service: Thoracic   ??? SINUS SURGERY         Other History:  The social history and family history were personally reviewed and updated in the patient's electronic medical record.     Home Medications:  Current Outpatient Medications on File Prior to Visit   Medication Sig Dispense Refill   ??? budesonide-formoteroL (SYMBICORT) 160-4.5 mcg/actuation inhaler Inhale 2 puffs Two (2) times a day. 30.6 g 3   ??? elexacaftor-tezacaftor-ivacaft (TRIKAFTA) 100-50-75 mg(d) /150 mg (n) tablet Take 2 Tablets (orange) by mouth in the morning and 1 tablet (blue) in the evening with fatty food 84 tablet  5   ??? FLUoxetine 60 mg Tab Take 60 mg by mouth daily.      ??? fluticasone propionate (FLONASE) 50 mcg/actuation nasal spray 2 sprays into each nostril daily. (Patient taking differently: 1 spray into each nostril daily.)     ??? gabapentin (NEURONTIN) 600 MG tablet Take 1 tablet (600 mg total) by mouth Three (3) times a day.     ??? insulin ASPART (NOVOLOG U-100 INSULIN ASPART) 100 unit/mL injection Subcutaneously infuse up to 150 units daily via insulin pump 150 mL 11   ??? insulin pump cart,automated,BT (OMNIPOD 5 G6 PODS, GEN 5,) Crtg 1 each by subcutaneous (via wearable injector) route every three (3) days. Change every 72 hour 10 each 12   ??? lamoTRIgine (LAMICTAL) 200 MG tablet Take 2 tablets (400 mg total) by mouth daily.     ??? levocetirizine (XYZAL) 5 MG tablet Take 1 tablet (5 mg total) by mouth every evening. 90 tablet 3   ??? lipase-protease-amylase (CREON) 36,000-114,000- 180,000 unit CpDR Take 8 caps by mouth with meals three times daily and 4 caps with snacks up to three times a day.     ??? lisinopriL (PRINIVIL,ZESTRIL) 10 MG tablet Take 1 tablet (10 mg total) by mouth daily. 30 tablet 0   ??? MVW COMPLETE FORMUL PROBIOTIC 40 billion cell -15 mg CpDR Take 1 capsule by mouth daily. (Patient taking differently: Take 1 capsule by mouth in the morning.) 90 capsule 3   ??? MVW Complete, pediatric multivit 61-D3-vit K, (MVW COMPLETE FORMULATION) 1,500-800 unit-mcg cap Take 2 capsules by mouth Two (2) times a day. (Patient taking differently: Take 2 capsules by mouth daily.)     ??? omeprazole (PRILOSEC) 20 MG capsule Take 1 capsule (20 mg total) by mouth daily. 90 capsule 3   ??? pramipexole (MIRAPEX) 0.125 MG tablet Take 2 tablets (0.25 mg total) by mouth nightly.     ??? PULMOZYME 1 mg/mL nebulizer solution Inhale 2.5 mg daily. 225 mL 3   ??? sodium chloride 7% 7 % Nebu Inhale 4 mL by nebulization two (2) times a day.     ??? traZODone (DESYREL) 150 MG tablet Take 1 tablet (150 mg total) by mouth nightly.     ??? XARELTO 20 mg tablet Take 1 tablet (20 mg total) by mouth daily.     ??? acetaminophen (TYLENOL) 500 MG tablet Take 1 tablet (500 mg total) by mouth every six (6) hours as needed. 30 tablet 0   ??? albuterol (PROVENTIL HFA;VENTOLIN HFA) 90 mcg/actuation inhaler Inhale 2 puffs every six (6) hours as needed. 1 Inhaler 1   ??? alcohol swabs (ALCOHOL PREP PADS) PadM Use as directed with inhaled antibiotics 100 each 11   ??? amitriptyline (ELAVIL) 100 MG tablet TAKE ONE TABLET BY MOUTH AT BEDTIME (Patient taking differently: Take 1 tablet (100 mg total) by mouth nightly.) 30 tablet 1   ??? blood sugar diagnostic (GLUCOSE BLOOD) Strp by Other route Three (3) times a day with a meal. Rx sent to [facility] 01/04/20     ??? blood-glucose meter kit Dispense meter that is preferred by patient's insurance company 1 each 0   ??? blood-glucose meter,continuous (DEXCOM G6 RECEIVER) Misc Use as directed 1 each 0   ??? blood-glucose sensor (DEXCOM G6 SENSOR) Devi Use sq as directed every 10 days 9 each 11   ??? blood-glucose transmitter (DEXCOM G6 TRANSMITTER) Devi Use as directed, replace every 90 days 1 each 11   ??? colistimethate (COLYMYCIN) 150 mg injection Inhale  2 mL (150 mg total) Two (2) times a day, alternating 28 days on and 28 days off. Inject 2mL SWFI into colistin vial & gently mix, then withdraw 2mL (150mg ) for dose. 120 mL 6   ??? EASY TOUCH LANCING DEVICE Misc Use as directed.     ??? EASY TOUCH TWIST LANCETS 30 gauge Misc Use as directed.     ??? empty container Misc Use as directed to dispose of syringes 1 each 0   ??? FREESTYLE LIBRE 14 DAY SENSOR kit Use 3-4 times daily     ??? melatonin 10 mg Tab Take 1 tablet (10 mg total) by mouth nightly as needed. (Patient not taking: Reported on 01/25/2022)     ??? montelukast (SINGULAIR) 10 mg tablet Take 1 tablet (10 mg total) by mouth nightly. 90 tablet 3   ??? nebulizers (LC PLUS) Misc use as directed with nebulized medications twice daily. 4 each 4   ??? nebulizers (PARI LC D NEBULIZER) Misc Use with inhaled medication. 1 each 6   ??? nebulizers Misc Use as directed with inhaled medications 4 each 3   ??? pen needle, diabetic (BD ULTRA-FINE NANO PEN NEEDLE) 32 gauge x 5/32 (4 mm) Ndle use up to 4 times daily 400 each 12   ??? sterile water Soln Use 2 mL to mix colistin, then add another 1 mL SWFI into neb cup along with mixed colistin. 600 mL 6   ??? syringe with needle (BD LUER-LOK SYRINGE) 3 mL 21 gauge x 1 Syrg For use with inhaled antibiotic 60 each 6     No current facility-administered medications on file prior to visit.        Allergies:  Allergies as of 01/25/2022 - Reviewed 01/25/2022   Allergen Reaction Noted   ??? Aztreonam Anaphylaxis, Hives, Nausea And Vomiting, and Rash 09/06/2015   ??? [place] [aztreonam lysine] Anaphylaxis 12/27/2016   ??? Cefepime Itching, Nausea Only, and Other (See Comments) 09/06/2015   ??? Other Anaphylaxis and Other (See Comments) 09/06/2015   ??? Slo-bid 100 Anaphylaxis 06/20/2017   ??? Tobramycin Tinnitus 09/06/2015   ??? Banana Itching and Nausea And Vomiting 07/29/2016     Genetics:  [id number] and [id number] insertion  ??  Airway Clearance Regimen:  HS BID  Pulmozyme  ??  Inhaled ABX:  Colistin every other month - started in March 2023 but hasn't used since.   Off TOBI due to tinnitus/hearing loss   ??  Hemoptysis:   No  ABPA:             No  Ptx:                  No  Sinusitus:       Yes  ??  Panc Insuf:     Yes  PEG:                No  DIOS:              No bowel issues  CF Liver Dz:   No  ??  Diabetes:        Uncontrolled - follows w/ endocrine  Osteopenia: N/A  ??  Depression:   Yes  ??  Other Co-morbidities:  Herniated disc with lumbar radiculopathy   ??  Social Setting:  Lives with wife in [place]    Micro History:  ??  MABSC  - Assumed S to erythro from [facility]  06/2017: MASBC not present, but M gordonae is   ??  MSSA (  09/29/19)     Smooth PsA  S: Cipro, Levo, Tobra  I: Aztreo, Cefe, Ceftaz, PT, LVX  ??  Stenotrophamonas  R: Ceftat  ??  MABSC (macrolide sensitive)  Last Negative Cx: 03/2017  Last Positive: 02/21/17  ????  Review of Systems:  A comprehensive review of systems was completed and negative except as noted in HPI.    PHYSICAL EXAM:     Vitals:    01/25/22 1333   BP: 131/83   BP Site: R Arm   BP Position: Sitting   BP Cuff Size: Medium   Pulse: 97   Temp: 36.7 ??C (98 ??F)   TempSrc: Temporal   SpO2: 97%   Weight: (!) 107.5 kg (237 lb)     Body mass index is 32.14 kg/m??.  Wt Readings from Last 3 Encounters:   01/25/22 (!) 107.5 kg (237 lb)   01/25/22 (!) 107.5 kg (237 lb)   11/03/21 (!) 108.2 kg (238 lb 8.6 oz)     GEN: Cooperative male, sitting up on exam table, NAD, fatigued appearing, eyes squinted   HEENT: NCAT, EOMI, sclerae anicteric  NECK: Supple, trachea midline  LYMPH: No palpable lymphadenopathy   CHEST: Port-a-cath present in left upper chest wall  HEART/CV: RRR, S1, S2 nl, no MRG  LUNGS/RESP: CTA bilaterally, no crackles or wheezes, normal WOB on RA  ABD/GI: NABS, soft, NT/ND, no rebound or guarding, no masses, no hepatomegaly noted   EXT: No cyanosis, clubbing, or edema  SKIN: No rashes or lesions   NEURO: No focal deficits noted  PSYCH: Answers questions appropriately.     LABORATORY and RADIOLOGY DATA:     Pulmonary Function Tests:  01/25/22        Interpretation: Spirometry demonstrates mild restriction, FEV1 improved from last test.     Pertinent Laboratory Data:  CF Sputum Culture   Date Value Ref Range Status   01/25/2022 Culture in Progress  Preliminary   11/04/2021 2+ Smooth Pseudomonas aeruginosa (A)  Final   11/04/2021 2+ Mucoid Pseudomonas aeruginosa (A)  Final   11/04/2021 2+ Oropharyngeal Flora Isolated  Final        AFB Smear   Date Value Ref Range Status   01/25/2022   Final    NO ACID FAST BACILLI SEEN- 3 negative smears do not exclude pulmonary TB. If active pulmonary TB is suspected, continue airborne isolation until pulmonary disease is excluded by negative cultures.     AFB Culture   Date Value Ref Range Status   11/04/2021 No Acid Fast Bacilli Detected  Final        IgE, Total   Date Value Ref Range Status   11/04/2021 18.2 2-214 IU/mL IU/mL Final        Total Bilirubin   Date Value Ref Range Status   11/15/2021 0.4 0.3 - 1.2 mg/dL Final     AST   Date Value Ref Range Status   11/15/2021 25 <=34 U/L Final     ALT   Date Value Ref Range Status   11/15/2021 39 10 - 49 U/L Final     Alkaline Phosphatase   Date Value Ref Range Status   11/15/2021 67 46 - 116 U/L Final        INR   Date Value Ref Range Status   11/13/2021 1.17  Final        Hemoglobin A1C   Date Value Ref Range Status   11/03/2021 10.6 (H) 4.8 - 5.6 % Final  Estimated Average Glucose   Date Value Ref Range Status   11/03/2021 258 mg/dL Final        Vitamin A   Date Value Ref Range Status   11/15/2021 40.8 32.5 - 78.0 mcg/dL Final     Comment:        -------------------ADDITIONAL INFORMATION-------------------  This test was developed and its performance characteristics   determined by [facility] in a manner consistent with CLIA   requirements. This test has not been cleared or approved by   the U.S. Food and Drug Administration.     Vitamin E   Date Value Ref Range Status   11/15/2021 10.0 5.5 - 17.0 mg/L Final     Comment:        -------------------ADDITIONAL INFORMATION-------------------  This test was developed and its performance characteristics   determined by [facility] in a manner consistent with CLIA   requirements. This test has not been cleared or approved by   the U.S. Food and Drug Administration.     Test Performed by:  [facility]  [address] [place], [place], [place] [address]  Lab Director: Paul Dykes M.D. Ph.D.; CLIA# [id number]     Vitamin D Total (25OH)   Date Value Ref Range Status   11/15/2021 31.1 20.0 - 80.0 ng/mL Final      Imaging:   CXR 01/25/22:   No acute airspace disease

## 2022-01-29 DIAGNOSIS — Z7901 Long term (current) use of anticoagulants: Principal | ICD-10-CM

## 2022-01-29 DIAGNOSIS — Z86718 Personal history of other venous thrombosis and embolism: Principal | ICD-10-CM

## 2022-01-29 NOTE — Unmapped (Signed)
[facility]  Adult Cystic Fibrosis Clinic      [name] endorses some food and gas insecurity and a [employer] grocery/gas assistance form was completed and sent. [name] has used [employer] in the past for financial assistance and this SW encouraged him to reach out to [place] if needed. Also administered and discussed the screeners.       MENTAL HEALTH/COPING: SW reviewed the mental health screening process and introduced the substance use screening process.       Bubba Camp self administered the PHQ-9 for depression and GAD-7 for anxiety. He scored a 14 on PHQ-9 indicating moderate depression and he scored a 17 on the GAD-7 indicating severe anxiety.    Fielding Mault denies SI.      PHQ-9 Score: 14    Screening complete, depression identified / today's follow-up action documented in note    Met with [name] and his wife [name] at his clinic appointment. [name]'s wife feels his anxiety is his biggest concern. She works out of the home and they only have one car. [name] states this is a challenge as he is stuck at home all day and is not working. He was seeing a therapist and a medication prescriber several years ago but when his insurance changed, he was unable to continue to see his prescriber. He states his therapy was terminated because he missed too many appointments from being hospitalized. His PCP now prescribes his Prozac 60 mg, Lamictal 400 mg (200 mg BID), and Trazodone 150 mg for sleep.     Discussed resources for identifying a therapist and psychiatry provider and [name] has used [facility] in the past. His insurance will change again in July to [facility] and the hope is more providers will be in network with the new insurance. Explored barriers to finding new providers and [name] states he understands he needs to make phone calls and send emails to potential therapists. Discussed behavioral activation and making small goals such as calling 1-2 providers a day.  He would prefer a male therapist and states he has a lot of medical trauma, as well as childhood trauma. This Clinical research associate mentioned [employer] as a Insurance underwriter for telehealth and [name] stated he had heard of [employer]. Encouraged him to explore [employer] as they can match people to therapists and can provide a sliding scale fee if needed. Of note, discussed SI as [name] has a gun and he continues to deny SI. He states he and his wife have a plan that if he experiences SI, he will let her know and she will change the code to the gun safe so that he does not have access.     This Clinical research associate and [name] agreed that this Clinical research associate will follow-up with [name] on July 19th to discuss his progress on identifying a therapist.        SUBSTANCE USE:  Bubba Camp self-administered the AUDIT for alcohol use and the DAST-10 for drug abuse.  He scored a 8 on the AUDIT, indicating risky alcohol use.  He scored a 1 on the DAST-10, indicating risky drug use.      [name] states he typically only drinks monthly but has 5-6 drinks at a time. He understands alcohol can impact his mood and impair judgment. He also endorses using edibles several times a week and feels they help with his chronic pain and decrease his anxiety.

## 2022-02-06 ENCOUNTER — Ambulatory Visit: Admit: 2022-02-06 | Payer: PRIVATE HEALTH INSURANCE

## 2022-02-06 ENCOUNTER — Ambulatory Visit
Admit: 2022-02-06 | Discharge: 2022-02-15 | Disposition: A | Discharge status: Home / Self Care | Payer: PRIVATE HEALTH INSURANCE | Admitting: Family

## 2022-02-06 ENCOUNTER — Encounter: Admit: 2022-02-06 | Discharge: 2022-02-15 | Payer: PRIVATE HEALTH INSURANCE | Attending: Anesthesiology

## 2022-02-06 LAB — SEDIMENTATION RATE: ERYTHROCYTE SEDIMENTATION RATE: 9 mm/h (ref 0–15)

## 2022-02-06 LAB — HEPATIC FUNCTION PANEL
ALBUMIN: 4.4 g/dL (ref 3.4–5.0)
ALBUMIN: 4.2 g/dL (ref 3.4–5.0)
ALKALINE PHOSPHATASE: 124 U/L — ABNORMAL HIGH (ref 46–116)
ALKALINE PHOSPHATASE: 116 U/L (ref 46–116)
ALT (SGPT): 51 U/L — ABNORMAL HIGH (ref 10–49)
ALT (SGPT): 63 U/L — ABNORMAL HIGH (ref 10–49)
AST (SGOT): 30 U/L (ref <=34)
AST (SGOT): 42 U/L — ABNORMAL HIGH (ref <=34)
BILIRUBIN DIRECT: 0.1 mg/dL (ref 0.00–0.30)
BILIRUBIN DIRECT: 0.2 mg/dL (ref 0.00–0.30)
BILIRUBIN TOTAL: 0.4 mg/dL (ref 0.3–1.2)
BILIRUBIN TOTAL: 0.5 mg/dL (ref 0.3–1.2)
PROTEIN TOTAL: 7.9 g/dL (ref 5.7–8.2)
PROTEIN TOTAL: 7.4 g/dL (ref 5.7–8.2)

## 2022-02-06 LAB — PHOSPHORUS: PHOSPHORUS: 3.9 mg/dL (ref 2.4–5.1)

## 2022-02-06 LAB — BASIC METABOLIC PANEL
ANION GAP: 9 mmol/L (ref 5–14)
BLOOD UREA NITROGEN: 12 mg/dL (ref 9–23)
BUN / CREAT RATIO: 20
CALCIUM: 9.7 mg/dL (ref 8.7–10.4)
CHLORIDE: 98 mmol/L (ref 98–107)
CO2: 26.4 mmol/L (ref 20.0–31.0)
CREATININE: 0.6 mg/dL
EGFR CKD-EPI (2021) MALE: >90 mL/min/{1.73_m2} (ref >=60)
GLUCOSE RANDOM: 371 mg/dL — ABNORMAL HIGH (ref 70–179)
POTASSIUM: 4.3 mmol/L (ref 3.4–4.8)
SODIUM: 133 mmol/L — ABNORMAL LOW (ref 135–145)

## 2022-02-06 LAB — CBC W/ AUTO DIFF
BASOPHILS ABSOLUTE COUNT: 0 10*9/L (ref 0.0–0.1)
BASOPHILS RELATIVE PERCENT: 0.2 %
EOSINOPHILS ABSOLUTE COUNT: 0.1 10*9/L (ref 0.0–0.5)
EOSINOPHILS RELATIVE PERCENT: 1.6 %
HEMATOCRIT: 43.1 % (ref 39.0–48.0)
HEMOGLOBIN: 14.7 g/dL (ref 12.9–16.5)
LYMPHOCYTES ABSOLUTE COUNT: 1.7 10*9/L (ref 1.1–3.6)
LYMPHOCYTES RELATIVE PERCENT: 24.8 %
MEAN CORPUSCULAR HEMOGLOBIN CONC: 34.1 g/dL (ref 32.0–36.0)
MEAN CORPUSCULAR HEMOGLOBIN: 27.3 pg (ref 25.9–32.4)
MEAN CORPUSCULAR VOLUME: 80 fL (ref 77.6–95.7)
MEAN PLATELET VOLUME: 8.5 fL (ref 6.8–10.7)
MONOCYTES ABSOLUTE COUNT: 0.5 10*9/L (ref 0.3–0.8)
MONOCYTES RELATIVE PERCENT: 7.8 %
NEUTROPHILS ABSOLUTE COUNT: 4.5 10*9/L (ref 1.8–7.8)
NEUTROPHILS RELATIVE PERCENT: 65.6 %
NUCLEATED RED BLOOD CELLS: 0 /100{WBCs} (ref <=4)
PLATELET COUNT: 244 10*9/L (ref 150–450)
RED BLOOD CELL COUNT: 5.39 10*12/L (ref 4.26–5.60)
RED CELL DISTRIBUTION WIDTH: 15.9 % — ABNORMAL HIGH (ref 12.2–15.2)
WBC ADJUSTED: 6.9 10*9/L (ref 3.6–11.2)

## 2022-02-06 LAB — MAGNESIUM: MAGNESIUM: 1.7 mg/dL (ref 1.6–2.6)

## 2022-02-06 LAB — C-REACTIVE PROTEIN: C-REACTIVE PROTEIN: <4 mg/L (ref <=10.0)

## 2022-02-06 MED ADMIN — oxyCODONE (ROXICODONE) immediate release tablet 5 mg: 5 mg | ORAL | @ 23:00:00 | Stop: 2022-02-20

## 2022-02-06 MED ADMIN — piperacillin-tazobactam (ZOSYN) IVPB (premix) 4.5 g: 4.5 g | INTRAVENOUS | @ 22:00:00 | Stop: 2022-02-20

## 2022-02-06 MED ADMIN — insulin lispro (HumaLOG) injection 0-20 Units: 0-20 [IU] | SUBCUTANEOUS | @ 22:00:00

## 2022-02-06 MED ADMIN — sodium chloride 0.9% (NS) bolus 1,000 mL: 1000 mL | INTRAVENOUS | @ 15:00:00 | Stop: 2022-02-06

## 2022-02-06 MED ADMIN — MORPhine 4 mg/mL injection 4 mg: 4 mg | INTRAVENOUS | @ 15:00:00 | Stop: 2022-02-06

## 2022-02-06 MED ADMIN — iohexoL (OMNIPAQUE) 350 mg iodine/mL solution 75 mL: 75 mL | INTRAVENOUS | @ 22:00:00 | Stop: 2022-02-06

## 2022-02-06 MED ADMIN — piperacillin-tazobactam (ZOSYN) 3.375 g in sodium chloride 0.9 % (NS) 100 mL IVPB-MBP: 3.375 g | INTRAVENOUS | @ 17:00:00 | Stop: 2022-02-06

## 2022-02-06 MED ADMIN — MORPhine 4 mg/mL injection 4 mg: 4 mg | INTRAVENOUS | @ 18:00:00 | Stop: 2022-02-06

## 2022-02-06 MED ADMIN — insulin glargine (LANTUS) injection 45 Units: 45 [IU] | SUBCUTANEOUS

## 2022-02-06 NOTE — Unmapped (Addendum)
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     Cystic fibrosis with acute bronchiectasis exacerbation: Acute on chronic life-threatening illness that could pose increased morbidity and mortality without frequent monitoring and evaluation.  presented with increased cough with sputum production, fatigue, dyspnea. Cultures 6/15 with mucoid and smooth PSA, was rhinovirus positive at that time.  FEV1 stable at that time at 75.  Started on levofloxacin and continued on colistin with worsening of symptoms.  The emergency department discussed it with pulmonologist at [facility] who discussed with Dr. [name] his pulmonologist.  Chest x-ray with some streaky atelectasis in the right lower lobe.  -Continued home Trikafta, ordered as nonformulary  -IV zosyn (likely 14 days) and inhaled colistin (full 28 day course started on 6/15)  -Has PORT, so no need for PICC  -Continue airway clearance, increased to 4 times daily with albuterol, hypertonic saline, daily Pulmozyme chest vest if able to with port accessed, if not can work with RT to find other airway clearance mechanisms such as Designer, jewellery  -Continue formulary substitution for home Symbicort  -Obtain updated CF sinus culture, AFB, RPP  -Repeat PFTs ordered to evaluate for FEV1  -Monitor CRP and ESR weekly  -Pulmonology consult in the morning  -Pulm treatment plan notes continuing azithromycin but do not see that patient is on this, can clarify with pulmonology    CF related sinus disease: Has had past sinus surgery 2014 and 2016.  Has seen Dr. Ralene Ok 6/15.  Noted that if you were admitted would consider trying to add him on for sinus surgery.  -Continue twice daily Flonase, order sinus rinses prior to Flonase  -Obtain CT sinus maxillofacial  -ENT consult to discuss possible sinus surgery while inpatient  -Continue Singulair, substitute loratadine for home Xyzal as nonformulary  -If he goes for sinus surgery, anticoagulation will need to be stopped, substituting enoxaparin for home Xarelto until plan becomes more clear consider hematology consult once plans are more definitive    CF-related diabetes, poorly-controlled: HbA1c 10.6 on 11/03/21. Uses insulin pump at home. ??He was treated with subcutaneous insulin regimen of Lantus 45 units at bedtime and lispro 20 units 3 times daily AC during last hospitalization.  Did not bring insulin pump.  -Start Lantus 45 units nightly, lispro 20 units 3 times daily AC as well as correctional scale insulin  -Consider endocrinology consult if refractory hyper or hypoglycemia  -Patient should resume home insulin pump on discharge  -Repeat hemoglobin A1c    Hyponatremia: Pseudohyponatremia in the setting of hyperglycemia.  -Monitor    Elevated liver enzymes: Has had mild elevations in the past which may be related to fatty liver disease.  -Monitor    Chronic Medical Conditions:   Pancreatic insufficiency- continue home Creon (8 capsules with meals, 4 capsules with snacks)  Mood disorder-??continue home fluoxetine 60mg  daily, amitriptyline 100mg  daily, and lamotrigine 400mg  daily  History of DVT-we will substitute enoxaparin 1 mg/kg BID adjusted body weight w/ obesity, for home Xarelto while surgical planning becomes more clear  GERD- continue formulary substitute for home PPI 20mg  daily  HTN-continue lisinopril 10 mg   RLS- continue home mirapex 0.25mg  nightly, decreased trazodone nightly, QTc checked and stable  Chronic back pain-patient has no longer taking gabapentin.    Daily Checklist:  Diet: Regular Diet  DVT PPx: Enoxaparin   Code Status: Full Code  Patient  has decisional making capacity?  Yes  Dispo: Admit to main   Droplet isolation precautions with recent rhinovirus infection    I saw and evaluated the patient, participating in the key portions of the service.  I reviewed the resident???s note.  I agree with the resident???s findings and plan.     I personally spent 70 minutes face-to-face and non-face-to-face in the care of this patient, which includes all pre, intra, and post visit time on the date of service.  All documented time was specific to the E/M visit and does not include any procedures that may have been performed.  Time spent includes reviewing past documentation, discussion of care with the ED, discussion of care with patient, placing orders and documentation.      Pixie Casino, MD   Pager [phone]    ___________________________________________________________________    Chief Complaint:  Chief Complaint   Patient presents with   ??? Chest Pain     Cystic fibrosis exacerbation (CMS-HCC)    HPI:  Christian Young??is a 36 y.o.??male??with history of history of cystic fibrosis (genetics S945L and [phone] insertion) on Trikafta complicated by CFRD, pancreatic exocrine insufficiency, CF related diabetes, past VTE on anticoagulation, that presented to [facility] with??Cystic fibrosis with pulmonary exacerbation (CMS-HCC).     The patient was seen in 615 on pulmonary clinic for follow-up.  He had been feeling poorly in the last week with some systemic symptoms and increased productive cough although his FEV1 was improved at 75%.  On his RPP, he was found to be rhinovirus positive.  Labs were normal and chest x-ray without acute abnormalities.  He was sent home on a course of oral levofloxacin as well as inhaled colistin and instructed to increase his airway clearance.  However, continued to feel poorly and weak.    He reports that over the past few days he is continuing to feel worse.  He has been doing airway clearance up to 3 times a day and reports taking the levofloxacin.  He has continued to have green sputum without hemoptysis.  This is intermittent because sometimes he feels that he cannot get the sputum out.  He has been using his as needed albuterol rescue inhaler several times daily.  He reports that a few nights ago that he thought he had a fever up to 101 but this has not recurred otherwise he has just had low-grade temperature elevations.  He denies rigors.  He reports that today he decided to come in because he felt like he had run a marathon even though he had just moved around a little bit.  He is on an insulin pump at baseline but disconnected it prior to coming in.  He has not noticed redness or erythema over his port site.  He has also noted increasing pain and sinus headaches.  Denies falls or trauma.  He reports that he is compliant with Xarelto.  In the ED:   Na 133, glucose 371, no leukocytosis   CXR with streaky atelectasis   Blood cultures obtained and pending   Given 1 L of lactated ringer  Given dose of piperacillin/tazobactam    Allergies:  Aztreonam, [place] [aztreonam lysine], Cefepime, Other, Slo-bid 100, Tobramycin, and Banana    Medications:   Prior to Admission medications    Medication Dose, Route, Frequency   acetaminophen (TYLENOL) 500 MG tablet 500 mg, Oral, Every 6 hours PRN   albuterol (PROVENTIL HFA;VENTOLIN HFA) 90 mcg/actuation inhaler 2 puffs, Inhalation, Every 6 hours PRN   albuterol 2.5 mg /3 mL (0.083 %) nebulizer solution 2.5 mg, Nebulization, 2 times a day   alcohol swabs (ALCOHOL PREP PADS) PadM Use as directed with inhaled antibiotics   amitriptyline (ELAVIL) 100 MG tablet TAKE ONE TABLET BY MOUTH AT BEDTIME  Patient taking differently: Take 1 tablet (100 mg total) by mouth nightly.   atorvastatin (LIPITOR) 40 MG tablet 40 mg, Oral, Nightly   blood sugar diagnostic (GLUCOSE BLOOD) Strp Other, 3 times a day (with meals), Rx sent to [facility] 01/04/20    blood-glucose meter kit Dispense meter that is preferred by patient's insurance company   blood-glucose meter,continuous (DEXCOM G6 RECEIVER) Misc Use as directed   blood-glucose sensor (DEXCOM G6 SENSOR) Devi Use sq as directed every 10 days   blood-glucose transmitter (DEXCOM G6 TRANSMITTER) Devi Use as directed, replace every 90 days   budesonide-formoteroL (SYMBICORT) 160-4.5 mcg/actuation inhaler 2 puffs, Inhalation, 2 times a day (standard)   colistimethate (COLYMYCIN) 150 mg injection Inhale 2 mL (150 mg total) Two (2) times a day, alternating 28 days on and 28 days off. Inject 2mL SWFI into colistin vial & gently mix, then withdraw 2mL (150mg ) for dose.   colistimethate (COLYMYCIN) 150 mg injection Inhale 2 mL (150 mg total) Two (2) times a day, alternating 28 days on and 28 days off. Inject 2mL SWFI into colistin vial & gently mix, then withdraw 2mL (150mg ) for dose.   EASY TOUCH LANCING DEVICE Misc Use as directed.   EASY TOUCH TWIST LANCETS 30 gauge Misc Use as directed.   elexacaftor-tezacaftor-ivacaft (TRIKAFTA) 100-50-75 mg(d) /150 mg (n) tablet Take 2 Tablets (orange) by mouth in the morning and 1 tablet (blue) in the evening with fatty food   empty container Misc Use as directed to dispose of syringes   FLUoxetine 60 mg Tab 60 mg, Oral, Daily (standard)   fluticasone propionate (FLONASE) 50 mcg/actuation nasal spray 2 sprays, Each Nare, Daily (standard)  Patient taking differently: 1 spray into each nostril daily.   FREESTYLE LIBRE 14 DAY SENSOR kit Use 3-4 times daily   insulin ASPART (NOVOLOG U-100 INSULIN ASPART) 100 unit/mL injection Subcutaneously infuse up to 150 units daily via insulin pump   insulin pump cart,automated,BT (OMNIPOD 5 G6 PODS, GEN 5,) Crtg 1 each, subcutaneous (via wearable injector), Every 3 days, Change every 72 hour   lamoTRIgine (LAMICTAL) 200 MG tablet 400 mg, Oral, Daily (standard)   levocetirizine (XYZAL) 5 MG tablet 5 mg, Oral, Every evening   levoFLOXacin (LEVAQUIN) 750 MG tablet 750 mg, Oral, Daily (standard)   lipase-protease-amylase (CREON) 36,000-114,000- 180,000 unit CpDR Take 8 caps by mouth with meals three times daily and 4 caps with snacks up to three times a day.   lisinopriL (PRINIVIL,ZESTRIL) 10 MG tablet 10 mg, Oral, Daily (standard)   montelukast (SINGULAIR) 10 mg tablet 10 mg, Oral, Nightly   MVW COMPLETE FORMUL PROBIOTIC 40 billion cell -15 mg  CpDR 1 capsule, Oral, Daily (standard)  Patient taking differently: Take 1 capsule by mouth in the morning.   MVW Complete, pediatric multivit 61-D3-vit K, (MVW COMPLETE FORMULATION) 1,500-800 unit-mcg cap 2 capsules, Oral, 2 times a day (standard)  Patient taking differently: Take 2 capsules by mouth daily.   nebulizers (LC PLUS) Misc use as directed with nebulized medications twice daily.   nebulizers (PARI LC D NEBULIZER) Misc Use with inhaled medication.   nebulizers Misc Use as directed with inhaled medications   omeprazole (PRILOSEC) 20 MG capsule 20 mg, Oral, Daily (standard)   pen needle, diabetic (BD ULTRA-FINE NANO PEN NEEDLE) 32 gauge x 5/32 (4 mm) Ndle use up to 4 times daily   pramipexole (MIRAPEX) 0.125 MG tablet 0.25 mg, Oral, Nightly   PULMOZYME 1 mg/mL nebulizer solution 2.5 mg, Inhalation, Daily (standard)   sodium chloride 7% 7 % Nebu 4 mL, Nebulization, 2 times a day   sterile water Soln Use 2 mL to mix colistin, then add another 1 mL SWFI into neb cup along with mixed colistin.   syringe with needle (BD LUER-LOK SYRINGE) 3 mL 21 gauge x 1 Syrg For use with inhaled antibiotic   traZODone (DESYREL) 150 MG tablet 150 mg, Oral, Nightly   XARELTO 20 mg tablet 20 mg, Oral, Daily (standard)       Medical History:  Past Medical History:   Diagnosis Date   ??? Anxiety    ??? Asthma    ??? Chronic pain disorder    ??? Cystic fibrosis (CMS-HCC)    ??? Depression    ??? Diabetes mellitus (CMS-HCC)    ??? Hypertension    ??? Lumbar radiculopathy 10/26/2020   ??? Nonproductive cough 04/05/2018       Surgical History:  Past Surgical History:   Procedure Laterality Date   ??? IR INSERT PORT AGE GREATER THAN 5 YRS  03/27/2019    IR INSERT PORT AGE GREATER THAN 5 YRS 03/27/2019 Rush Barer, MD IMG [facility]   ??? PR REMOVAL OF LUNG,LOBECTOMY Right 03/29/2017    Procedure: REMOVAL OF LUNG, OTHER THAN PNEUMONECTOMY; SINGLE LOBE (LOBECTOMY);  Surgeon: Cherie Dark, MD;  Location: MAIN [facility];  Service: Thoracic   ??? SINUS SURGERY         Social History:  Social History     Socioeconomic History   ??? Marital status: Married   Tobacco Use   ??? Smoking status: Former     Types: e-Cigarettes   ??? Smokeless tobacco: Never   Vaping Use   ??? Vaping Use: Former   Substance and Sexual Activity   ??? Alcohol use: Not Currently     Alcohol/week: 3.0 standard drinks     Types: 3 Glasses of wine per week   ??? Drug use: No   ??? Sexual activity: Yes     Social Determinants of Health     Financial Resource Strain: Low Risk    ??? Difficulty of Paying Living Expenses: Not hard at all   Food Insecurity: Food Insecurity Present   ??? Worried About Programme researcher, broadcasting/film/video in the Last Year: Never true   ??? Ran Out of Food in the Last Year: Sometimes true   Transportation Needs: No Transportation Needs   ??? Lack of Transportation (Medical): No   ??? Lack of Transportation (Non-Medical): No        Family History:  Family History   Problem Relation Age of Onset   ??? Bipolar disorder Mother    ???  Depression Mother        Review of Systems:  10 systems reviewed and are negative unless otherwise mentioned in HPI    Labs/Studies:  Labs and Studies from the last 24hrs per EMR and Reviewed    I have reviewed all labs and independently reviewed imaging from this hospitalization. Additionally:   I have reviewed the EKG and compared to prior EKG    ECG showing Q waves in 3 seen on previous ECG from January, S wave in 1 also seen on previous ECG in January, prominent P waves in 2, normal rate    Physical Exam:  Temp:  [36.5 ??C (97.7 ??F)] 36.5 ??C (97.7 ??F)  Heart Rate:  [90] 90  Resp:  [24] 24  BP: (125)/(102) 125/102  SpO2:  [96 %] 96 %   There is no height or weight on file to calculate BMI.     GEN: NAD, lying in bed, alert, oriented  EYES: EOMI  ENT: Nose with prominent turbinates w on the left as well is yellow-green mucus in the bilateral nares, tenderness to palpation over the frontal and maxillary sinus  CV: RRR, no murmurs appreciated, port in place accessed without surrounding erythema or tenderness  PULM: Intermittent cough, right lower lobe with crackles, no wheezing heard, normal work of breathing  ABD: soft, NT/ND, +BS  EXT: No edema, warm extremities  NEURO: No focal deficits  PSYCH: A+Ox3, appropriate  GU: No CVA tenderness  MSK: No spinal tenderness

## 2022-02-06 NOTE — Unmapped (Signed)
[facility]  Emergency Department Provider Note      ED Clinical Impression      Final diagnoses:   Pneumonia of right lung due to infectious organism, unspecified part of lung (Primary)   CF (cystic fibrosis) (CMS-HCC)            Impression, Medical Decision Making, Progress Notes and Critical Care      Impression, Differential Diagnosis and Plan of Care    Christian Young is a 36 y.o. male with a history of CF presenting to the ED today for 2 weeks of chest pain, productive cough, fever (Tmax 101 F), and presyncopal episodes in the setting of having cystic fibrosis and rhinovirus 2 weeks ago.    On exam, the patient is alert, oriented, and in NAD. Vitals are stable.  Exam is notable for faint crackles in the right lower lung lobe, otherwise normal.    Differential includes viral or bacterial pneumonia     Plan for XR Chest, blood cultures, basic labs, magnesium and phosphorous. Will give morphine and fluids.    Discussion of Management with other Physicians, QHP or Appropriate Source: Pulmonary service    Independent Interpretation of Studies: CXR  External Records Reviewed: Patient's most recent discharge summary  Escalation of Care, Consideration of Admission/Observation/Transfer: ADMIT  Diagnostic tests considered but not performed: CHEST CT not needed  History obtained from other sources: Family    Additional Progress Notes         Portions of this record have been created using Scientist, clinical (histocompatibility and immunogenetics). Dictation errors have been sought, but may not have been identified and corrected.    See chart and resident provider documentation for details.    ____________________________________________         History        Reason for Visit  Chest Pain      HPI   Christian Young is a 36 y.o. male with a history of cystic fibrosis, DM, HTN and asthma presenting to the ED for evaluation of chest pain. Patient reports 2 weeks of chest pain, productive cough, presyncopal episodes and fever (Tmax 101 F). Patient was seen by Pulmonology 2 weeks ago (01/25/22) and was found to have rhonivirous in the setting of having cystic fibrosis. He was placed on oral antibiotics at the time and reports minimal improvement since. Additionally, a culture was drawn which grew pseudomonas.    Given getting worse on antibiotics with hx CF discussed with pulmonary and agree to admit patient - as also needs sinus surgery going to main campus     Past Medical History:   Diagnosis Date   ??? Anxiety    ??? Asthma    ??? Chronic pain disorder    ??? Cystic fibrosis (CMS-HCC)    ??? Depression    ??? Diabetes mellitus (CMS-HCC)    ??? Hypertension    ??? Lumbar radiculopathy 10/26/2020   ??? Nonproductive cough 04/05/2018       Patient Active Problem List   Diagnosis   ??? Essential hypertension   ??? Depressive disorder   ??? Mood disorder (CMS-HCC)   ??? Anxiety   ??? 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 Chronic pansinusitis   ??? Pancreatic insufficiency due to cystic fibrosis (CMS-HCC)   ??? History of Mycobacterium abscessus infection   ??? Bronchiectasis (CMS-HCC)   ??? Chronic deep vein thrombosis (DVT) of lower extremity (CMS-HCC)   ??? Obesity (BMI 30-39.9)   ??? Restless leg  syndrome   ??? Degeneration of lumbar intervertebral disc   ??? Lumbar radiculopathy   ??? Idiopathic peripheral neuropathy   ??? Long term current use of anticoagulant therapy   ??? Acute non-recurrent pansinusitis   ??? AKI (acute kidney injury) (CMS-HCC)   ??? Hemoptysis   ??? Fracture of multiple ribs of left side       Past Surgical History:   Procedure Laterality Date   ??? IR INSERT PORT AGE GREATER THAN 5 YRS  03/27/2019    IR INSERT PORT AGE GREATER THAN 5 YRS 03/27/2019 Rush Barer, MD IMG [facility]IR [facility]   ??? PR REMOVAL OF LUNG,LOBECTOMY Right 03/29/2017    Procedure: REMOVAL OF LUNG, OTHER THAN PNEUMONECTOMY; SINGLE LOBE (LOBECTOMY);  Surgeon: Cherie Dark, MD;  Location: MAIN OR [facility];  Service: Thoracic   ??? SINUS SURGERY           Current Facility-Administered Medications:   ???  piperacillin-tazobactam (ZOSYN) 3.375 g in sodium chloride 0.9 % (NS) 100 mL IVPB-MBP, 3.375 g, Intravenous, Once, Cherrie Gauze, MD    Current Outpatient Medications:   ???  acetaminophen (TYLENOL) 500 MG tablet, Take 1 tablet (500 mg total) by mouth every six (6) hours as needed., Disp: 30 tablet, Rfl: 0  ???  albuterol (PROVENTIL HFA;VENTOLIN HFA) 90 mcg/actuation inhaler, Inhale 2 puffs every six (6) hours as needed., Disp: 1 Inhaler, Rfl: 1  ???  albuterol 2.5 mg /3 mL (0.083 %) nebulizer solution, Inhale 3 mL (2.5 mg total) by nebulization two (2) times a day., Disp: 540 mL, Rfl: 3  ???  alcohol swabs (ALCOHOL PREP PADS) PadM, Use as directed with inhaled antibiotics, Disp: 100 each, Rfl: 11  ???  amitriptyline (ELAVIL) 100 MG tablet, TAKE ONE TABLET BY MOUTH AT BEDTIME (Patient taking differently: Take 1 tablet (100 mg total) by mouth nightly.), Disp: 30 tablet, Rfl: 1  ???  atorvastatin (LIPITOR) 40 MG tablet, Take 1 tablet (40 mg total) by mouth nightly., Disp: , Rfl:   ???  blood sugar diagnostic (GLUCOSE BLOOD) Strp, by Other route Three (3) times a day with a meal. Rx sent to [facility] 01/04/20, Disp: , Rfl:   ???  blood-glucose meter kit, Dispense meter that is preferred by [employer], Disp: 1 each, Rfl: 0  ???  blood-glucose meter,continuous (DEXCOM G6 RECEIVER) Misc, Use as directed, Disp: 1 each, Rfl: 0  ???  blood-glucose sensor (DEXCOM G6 SENSOR) Devi, Use sq as directed every 10 days, Disp: 9 each, Rfl: 11  ???  blood-glucose transmitter (DEXCOM G6 TRANSMITTER) Devi, Use as directed, replace every 90 days, Disp: 1 each, Rfl: 11  ???  budesonide-formoteroL (SYMBICORT) 160-4.5 mcg/actuation inhaler, Inhale 2 puffs Two (2) times a day., Disp: 30.6 g, Rfl: 3  ???  colistimethate (COLYMYCIN) 150 mg injection, Inhale 2 mL (150 mg total) Two (2) times a day, alternating 28 days on and 28 days off. Inject 2mL SWFI into colistin vial & gently mix, then withdraw 2mL (150mg ) for dose., Disp: 120 mL, Rfl: 6  ???  colistimethate (COLYMYCIN) 150 mg injection, Inhale 2 mL (150 mg total) Two (2) times a day, alternating 28 days on and 28 days off. Inject 2mL SWFI into colistin vial & gently mix, then withdraw 2mL (150mg ) for dose., Disp: 120 mL, Rfl: 5  ???  EASY TOUCH LANCING DEVICE Misc, Use as directed., Disp: , Rfl:   ???  EASY TOUCH TWIST LANCETS 30 gauge Misc, Use as directed., Disp: , Rfl:   ???  elexacaftor-tezacaftor-ivacaft (TRIKAFTA) 100-50-75 mg(d) /150 mg (n) tablet, Take 2 Tablets (orange) by mouth in the morning and 1 tablet (blue) in the evening with fatty food, Disp: 84 tablet, Rfl: 5  ???  empty container Misc, Use as directed to dispose of syringes, Disp: 1 each, Rfl: 0  ???  FLUoxetine 60 mg Tab, Take 60 mg by mouth daily. , Disp: , Rfl:   ???  fluticasone propionate (FLONASE) 50 mcg/actuation nasal spray, 2 sprays into each nostril daily. (Patient taking differently: 1 spray into each nostril daily.), Disp: , Rfl:   ???  FREESTYLE LIBRE 14 DAY SENSOR kit, Use 3-4 times daily, Disp: , Rfl:   ???  gabapentin (NEURONTIN) 600 MG tablet, Take 1 tablet (600 mg total) by mouth Three (3) times a day., Disp: , Rfl:   ???  insulin ASPART (NOVOLOG U-100 INSULIN ASPART) 100 unit/mL injection, Subcutaneously infuse up to 150 units daily via insulin pump, Disp: 150 mL, Rfl: 11  ???  insulin pump cart,automated,BT (OMNIPOD 5 G6 PODS, GEN 5,) Crtg, 1 each by subcutaneous (via wearable injector) route every three (3) days. Change every 72 hour, Disp: 10 each, Rfl: 12  ???  lamoTRIgine (LAMICTAL) 200 MG tablet, Take 2 tablets (400 mg total) by mouth daily., Disp: , Rfl:   ???  levocetirizine (XYZAL) 5 MG tablet, Take 1 tablet (5 mg total) by mouth every evening., Disp: 90 tablet, Rfl: 3  ???  levoFLOXacin (LEVAQUIN) 750 MG tablet, Take 1 tablet (750 mg total) by mouth daily for 14 days., Disp: 14 tablet, Rfl: 0  ???  lipase-protease-amylase (CREON) 36,000-114,000- 180,000 unit CpDR, Take 8 caps by mouth with meals three times daily and 4 caps with snacks up to three times a day., Disp: , Rfl:   ???  lisinopriL (PRINIVIL,ZESTRIL) 10 MG tablet, Take 1 tablet (10 mg total) by mouth daily., Disp: 30 tablet, Rfl: 0  ???  melatonin 10 mg Tab, Take 1 tablet (10 mg total) by mouth nightly as needed. (Patient not taking: Reported on 01/25/2022), Disp: , Rfl:   ???  montelukast (SINGULAIR) 10 mg tablet, Take 1 tablet (10 mg total) by mouth nightly., Disp: 90 tablet, Rfl: 3  ???  MVW COMPLETE FORMUL PROBIOTIC 40 billion cell -15 mg CpDR, Take 1 capsule by mouth daily. (Patient taking differently: Take 1 capsule by mouth in the morning.), Disp: 90 capsule, Rfl: 3  ???  MVW Complete, pediatric multivit 61-D3-vit K, (MVW COMPLETE FORMULATION) 1,500-800 unit-mcg cap, Take 2 capsules by mouth Two (2) times a day. (Patient taking differently: Take 2 capsules by mouth daily.), Disp: , Rfl:   ???  nebulizers (LC PLUS) Misc, use as directed with nebulized medications twice daily., Disp: 4 each, Rfl: 4  ???  nebulizers (PARI LC D NEBULIZER) Misc, Use with inhaled medication., Disp: 1 each, Rfl: 6  ???  nebulizers Misc, Use as directed with inhaled medications, Disp: 4 each, Rfl: 3  ???  omeprazole (PRILOSEC) 20 MG capsule, Take 1 capsule (20 mg total) by mouth daily., Disp: 90 capsule, Rfl: 3  ???  pen needle, diabetic (BD ULTRA-FINE NANO PEN NEEDLE) 32 gauge x 5/32 (4 mm) Ndle, use up to 4 times daily, Disp: 400 each, Rfl: 12  ???  pramipexole (MIRAPEX) 0.125 MG tablet, Take 2 tablets (0.25 mg total) by mouth nightly., Disp: , Rfl:   ???  PULMOZYME 1 mg/mL nebulizer solution, Inhale 2.5 mg daily., Disp: 225 mL, Rfl: 3  ???  sodium chloride 7% 7 % Nebu,  Inhale 4 mL by nebulization two (2) times a day., Disp: , Rfl:   ???  sterile water Soln, Use 2 mL to mix colistin, then add another 1 mL SWFI into neb cup along with mixed colistin., Disp: 600 mL, Rfl: 6  ???  syringe with needle (BD LUER-LOK SYRINGE) 3 mL 21 gauge x 1 Syrg, For use with inhaled antibiotic, Disp: 60 each, Rfl: 6  ???  traZODone (DESYREL) 150 MG tablet, Take 1 tablet (150 mg total) by mouth nightly., Disp: , Rfl:   ???  XARELTO 20 mg tablet, Take 1 tablet (20 mg total) by mouth daily., Disp: , Rfl:     Allergies  Aztreonam, [place] [aztreonam lysine], Cefepime, Other, Slo-bid 100, Tobramycin, and Banana    Family History   Problem Relation Age of Onset   ??? Bipolar disorder Mother    ??? Depression Mother        Social History  Social History     Tobacco Use   ??? Smoking status: Former     Types: e-Cigarettes   ??? Smokeless tobacco: Never   Vaping Use   ??? Vaping Use: Former   Substance Use Topics   ??? Alcohol use: Not Currently     Alcohol/week: 3.0 standard drinks     Types: 3 Glasses of wine per week   ??? Drug use: No          Physical Exam     This provider entered the patient's room: Yes:    ??? If this provider did enter the patient room, the following was PPE worn: Surgical mask, eye protection and gloves     ED Triage Vitals [02/06/22 1018]   Enc Vitals Group      BP 125/102      Heart Rate 90      SpO2 Pulse       Resp 24      Temp 36.5 ??C (97.7 ??F)      Temp Source Oral      SpO2 96 %     Constitutional: Alert and oriented. Well appearing and in no distress.  Eyes: Conjunctivae are normal.  ENT       Head: Normocephalic and atraumatic.       Nose: No congestion.       Mouth/Throat: Mucous membranes are moist.       Neck: No stridor.  Hematological/Lymphatic/Immunilogical: No cervical lymphadenopathy.  Cardiovascular: Normal rate, regular rhythm. Normal and symmetric distal pulses are present in all extremities.  Respiratory: Faint crackles in right lower lung lobe, otherwise normal.  Gastrointestinal: Soft and nontender. There is no CVA tenderness.  Musculoskeletal: Normal range of motion in all extremities.       Right lower leg: No tenderness or edema.       Left lower leg: No tenderness or edema.  Neurologic: Normal speech and language. No gross focal neurologic deficits are appreciated.  Skin: Skin is warm, dry and intact. No rash noted.  Psychiatric: Mood and affect are normal. Speech and behavior are normal.     Radiology     XR Chest 2 views   Final Result      Streaky right lower lung opacity, likely atelectasis.           Procedures     None.    Documentation assistance was provided by Leeann Must, Scribe, on February 06, 2022 at 10:41 AM for Cleophas Dunker, MD.    Documentation assistance was provided by the scribe in  my presence.  The documentation recorded by the scribe has been reviewed by me and accurately reflects the services I personally performed.         Cherrie Gauze, MD  02/06/22 1251

## 2022-02-06 NOTE — Unmapped (Signed)
Report called to [facility], RN unable to take report. Will call back for report

## 2022-02-06 NOTE — Unmapped (Addendum)
Admission scheduled: NO - patient presented to ED acutely    Reason for Admission: CF exacerbation    Christian Young is a 36 y.o. male with cystic fibrosis [phone] and [phone] insertion) on trikafta, pancreatic insufficiency, CFRD on insulin, mood disorder, recent rhinovirus infection on course of PO levaquin and month of inhaled colistin - presented to ED with worsening fatigue, ongoing green sputum production     Antibiotics:  Based on prior culture data (01/25/22) and allergy profile, favor treating with IV zosyn (likely 14 days) and inhaled colistin (full 28 day course started on 6/15).  Patient has a PORT - does not need a PICC line   Plan for likely 2 weeks of IV antibiotics. Duration to be determined based on clinical response and improvement in inflammatory markers and potential role for ENT sinus Young (Dr. Ralene Ok - see recent clinic note), and ability to tolerate airway clearance and pain control post op   Continue chronic azithromycin for anti-inflammatory effect.    Other medications:  Is on DOAC for history of blood clots (xarelto) - see discussion to consult heme pending any Young plans with ENT   Continue BID isotoninc nasal irrigation and nasal steroid sprays (2 sprays BID each nostril)    Airway clearance: Needs aggressive airway clearance 4 times per day.  Nebulized hypertonic saline 7%; pre-treatment with albuterol.  Pulmozyme once daily   For mechanical clearance, prefer vest depending on positioning of accessed port, but would encourage him to trial other forms of airway clearance he may be interested in    Consults:  Pulmonology  ENT ([facility]) - to determine timing of sinus Young (see last clinic note)   Hematology - to discuss anticoagulation plan perioperatively if able to get sinus Young this admission  Has CFRD, if sugars difficult to control in setting of exacerbation would consult endocrine     Labs:  On admission, CBC-D, CMP, CRP, ESR, CF sputum culture (NOT Lower resp culture) for bacterial and AFB culture (NTM screening), RPP, CXR, PT/INR and any other testing the examining providers deem appropriate  Weekly CRP and ESR to trend (including as outpatient).    Imaging:  CT Sinus - per Dr. Hansel Starling clinic note 01/25/22 - If he is admitted, it would be great to obtain a CT sinus - during the hospitalization. If admitted to Christian Young would consider trying to add him on.  Would need to be of anticoagulation to stop perioperatively as this would significantly increase the risk of Young.       Other Testing:  Patient does  need PFTs performed while in the hospital.   Please obtain early in admit (has already been on orals, last PFTs were actually within baseline but reports home spirometry now down), and around day 7-10 to evaluate response to antibiotics    [facility] Young):  Patient is not a candidate for Christian Young.    Dispo:   Patient will need follow-up appointment with Christian Young ~ 4 weeks post admission pending response and pending other interventions (sinus Young, etc.) please reach out to [facility] Young team prior to discharge.  Patient has done home IVs before HOWEVER before determining eligibility for home IVs, need to assess response given frequent exacerbations and determine role for additional procedures during admission with ENT - any plans for home IVs need to be discussed with consult team and primary pulmonologist (would not guarantee home IVs to patient given care coordination that needs to occur)  IF he goes home with home  IVs, Please reach out to [facility] Young Adult Cystic Fibrosis Young team (myself, Christian Duel RN, Christian Young CPP) prior to discharge to ensure that home infusion orders are correct and that we are comfortable with patient discharging home on IVs at that time.  We will follow monitoring labs. Does not need OPAT.    I am available to assist the primary team with his care during hospitalization.

## 2022-02-06 NOTE — Unmapped (Addendum)
Cystic fibrosis exacerbation: Patient presented to the ED with worsening fatigue, dyspnea and increased sputum production in the setting of recent rhinovirus for which he had been started on a course of PO Levaquin and 1 month of inhaled colistin. On admission,  RVP negative, CF sputum culture negative.  Chest x-ray with streaky right lower lung opacity, likely atelectasis.  Pulmonology consulted.  He was continued on IV Zosyn and based on inflammatory markers and clinical improvement the decision was made to continue this for a ***day course (6/27-***).  He was also continued on inhaled colistin x28 days (6/15-7/12) along with albuterol nebs, 7% hypertonic saline, chest vest 4x daily for airway clearance.  His home Trikafta, Pulmozyme, Breo Ellipta were continued as well.  He had repeat PFTs which showed***.      CF related sinus disease: Saw Dr. Ralene Ok as an outpatient 6/15 who recommended consideration of inpatient surgery if/when patient was admitted. Repeat CT sinus this admission showed near complete opacification of the bilateral maxillary sinuses, although the ostiomeatal units are patent bilaterally.  Patient went to the OR for repeat sinus surgery 6/30***  While admitted he was continued on his home Flonase, Claritin, Singulair.    CF related diabetes, poorly-controlled: A1c 11.3 on admission, up from 10.6 in March.  Uses an insulin pump at home which she did not bring with him to the hospital.  His BG was poorly controlled while admitted and endocrine was consulted; ultimately he achieved reasonable BG control with ***.  At discharge he will*** .    History of DVT: Hematology consulted given anticipated surgery in setting of unclear clotting history. They recommend transitioning to prophylactic anticoagulation with outpatient follow up for long-term anticoagulation planning.    Anticoagulation was held for 5 days after sinus surgery, restart 7/6. ENT asks that this be started while admitted to monitor for bleed. Possible dc 7/7 pending Pulmonology plan.  His home Xarelto was decreased to 10 mg daily at discharge.  He will follow-up with hematology as an outpatient.    CF related pancreatic insufficiency: Continued home Creon    Mood disorder: Continued home fluoxetine, amitriptyline, lamotrigine, trazodone    Hypertension: Continued home lisinopril    GERD:Continued formulary equivalent of home PPI    Restless leg syndrome: Continued home Mirapex    Chronic back pain: Patient reported he is no longer taking gabapentin.    Elevated liver enzymes (resolved): Has had mild elevations in the past which may be related to fatty liver disease.  LFTs are mildly elevated on admission and quickly normalized.

## 2022-02-06 NOTE — Unmapped (Signed)
Pt reports being sick for a bit.  Weak, unproductive cough, low endurance for a few weeks. +CF pt.

## 2022-02-07 LAB — CBC
HEMATOCRIT: 40.2 % (ref 39.0–48.0)
HEMOGLOBIN: 13.8 g/dL (ref 12.9–16.5)
MEAN CORPUSCULAR HEMOGLOBIN CONC: 34.4 g/dL (ref 32.0–36.0)
MEAN CORPUSCULAR HEMOGLOBIN: 27.4 pg (ref 25.9–32.4)
MEAN CORPUSCULAR VOLUME: 79.7 fL (ref 77.6–95.7)
MEAN PLATELET VOLUME: 8.5 fL (ref 6.8–10.7)
PLATELET COUNT: 207 10*9/L (ref 150–450)
RED BLOOD CELL COUNT: 5.04 10*12/L (ref 4.26–5.60)
RED CELL DISTRIBUTION WIDTH: 15.5 % — ABNORMAL HIGH (ref 12.2–15.2)
WBC ADJUSTED: 6.5 10*9/L (ref 3.6–11.2)

## 2022-02-07 LAB — ADDON DIFFERENTIAL ONLY
BASOPHILS ABSOLUTE COUNT: 0 10*9/L (ref 0.0–0.1)
BASOPHILS RELATIVE PERCENT: 0.8 %
EOSINOPHILS ABSOLUTE COUNT: 0.1 10*9/L (ref 0.0–0.5)
EOSINOPHILS RELATIVE PERCENT: 1.4 %
LYMPHOCYTES ABSOLUTE COUNT: 0.3 10*9/L — ABNORMAL LOW (ref 1.1–3.6)
LYMPHOCYTES RELATIVE PERCENT: 5.1 %
MONOCYTES ABSOLUTE COUNT: 0.6 10*9/L (ref 0.3–0.8)
MONOCYTES RELATIVE PERCENT: 10 %
NEUTROPHILS ABSOLUTE COUNT: 5.4 10*9/L (ref 1.8–7.8)
NEUTROPHILS RELATIVE PERCENT: 82.7 %

## 2022-02-07 LAB — HEMOGLOBIN A1C
ESTIMATED AVERAGE GLUCOSE: 278 mg/dL
HEMOGLOBIN A1C: 11.3 % — ABNORMAL HIGH (ref 4.8–5.6)

## 2022-02-07 LAB — BASIC METABOLIC PANEL
ANION GAP: 7 mmol/L (ref 5–14)
BLOOD UREA NITROGEN: 10 mg/dL (ref 9–23)
BUN / CREAT RATIO: 16
CALCIUM: 8.8 mg/dL (ref 8.7–10.4)
CHLORIDE: 98 mmol/L (ref 98–107)
CO2: 29 mmol/L (ref 20.0–31.0)
CREATININE: 0.61 mg/dL
EGFR CKD-EPI (2021) MALE: >90 mL/min/{1.73_m2} (ref >=60)
GLUCOSE RANDOM: 228 mg/dL — ABNORMAL HIGH (ref 70–179)
POTASSIUM: 3.9 mmol/L (ref 3.4–4.8)
SODIUM: 134 mmol/L — ABNORMAL LOW (ref 135–145)

## 2022-02-07 LAB — PROTIME-INR
INR: 1.06
PROTIME: 12 s (ref 9.8–12.8)

## 2022-02-07 LAB — MAGNESIUM: MAGNESIUM: 1.3 mg/dL — ABNORMAL LOW (ref 1.6–2.6)

## 2022-02-07 MED ADMIN — loratadine (CLARITIN) tablet 10 mg: 10 mg | ORAL | @ 14:00:00

## 2022-02-07 MED ADMIN — enoxaparin (LOVENOX) syringe 90 mg: 1 mg/kg | SUBCUTANEOUS | @ 15:00:00 | Stop: 2022-02-07

## 2022-02-07 MED ADMIN — lipase-protease-amylase (CREON) 36,000-114,000- 180,000 unit capsule 288,000 units of lipase **Patient Supplied**: 8 | ORAL | @ 22:00:00

## 2022-02-07 MED ADMIN — lipase-protease-amylase (CREON) 36,000-114,000- 180,000 unit capsule 288,000 units of lipase **Patient Supplied**: 8 | ORAL | @ 15:00:00

## 2022-02-07 MED ADMIN — amitriptyline (ELAVIL) tablet 100 mg: 100 mg | ORAL | @ 02:00:00

## 2022-02-07 MED ADMIN — sodium chloride 7% NEBULIZER solution 4 mL: 4 mL | RESPIRATORY_TRACT

## 2022-02-07 MED ADMIN — montelukast (SINGULAIR) tablet 10 mg: 10 mg | ORAL | @ 02:00:00

## 2022-02-07 MED ADMIN — insulin lispro (HumaLOG) injection 0-20 Units: 0-20 [IU] | SUBCUTANEOUS | @ 02:00:00

## 2022-02-07 MED ADMIN — traZODone (DESYREL) tablet 150 mg: 150 mg | ORAL | @ 02:00:00

## 2022-02-07 MED ADMIN — sodium chloride 7% NEBULIZER solution 4 mL: 4 mL | RESPIRATORY_TRACT | @ 21:00:00

## 2022-02-07 MED ADMIN — fluticasone propionate (FLONASE) 50 mcg/actuation nasal spray 2 spray: 2 | NASAL | @ 15:00:00

## 2022-02-07 MED ADMIN — pantoprazole (PROTONIX) EC tablet 20 mg: 20 mg | ORAL | @ 14:00:00

## 2022-02-07 MED ADMIN — piperacillin-tazobactam (ZOSYN) IVPB (premix) 4.5 g: 4.5 g | INTRAVENOUS | @ 22:00:00 | Stop: 2022-02-20

## 2022-02-07 MED ADMIN — sod chlor-bicarb-squeez bottle (NEILMED) nasal packet 1 packet: 1 | NASAL | @ 16:00:00 | Stop: 2022-02-13

## 2022-02-07 MED ADMIN — albuterol 2.5 mg /3 mL (0.083 %) nebulizer solution 2.5 mg: 2.5 mg | RESPIRATORY_TRACT | @ 02:00:00

## 2022-02-07 MED ADMIN — atorvastatin (LIPITOR) tablet 40 mg: 40 mg | ORAL | @ 02:00:00

## 2022-02-07 MED ADMIN — albuterol 2.5 mg /3 mL (0.083 %) nebulizer solution 2.5 mg: 2.5 mg | RESPIRATORY_TRACT | @ 21:00:00

## 2022-02-07 MED ADMIN — piperacillin-tazobactam (ZOSYN) IVPB (premix) 4.5 g: 4.5 g | INTRAVENOUS | @ 04:00:00 | Stop: 2022-02-20

## 2022-02-07 MED ADMIN — insulin lispro (HumaLOG) injection 0-20 Units: 0-20 [IU] | SUBCUTANEOUS | @ 14:00:00

## 2022-02-07 MED ADMIN — pancrelipase (Lip-Prot-Amyl) (CREON) 24,000-76,000 -120,000 unit delayed release capsule 288,000 units of lipase: 12 | ORAL | @ 02:00:00

## 2022-02-07 MED ADMIN — albuterol 2.5 mg /3 mL (0.083 %) nebulizer solution 2.5 mg: 2.5 mg | RESPIRATORY_TRACT | @ 16:00:00

## 2022-02-07 MED ADMIN — fluticasone furoate-vilanteroL (BREO ELLIPTA) 200-25 mcg/dose inhaler 1 puff: 1 | RESPIRATORY_TRACT | @ 12:00:00

## 2022-02-07 MED ADMIN — oxyCODONE (ROXICODONE) immediate release tablet 5 mg: 5 mg | ORAL | @ 04:00:00 | Stop: 2022-02-20

## 2022-02-07 MED ADMIN — lamoTRIgine (LaMICtal) tablet 400 mg: 400 mg | ORAL | @ 14:00:00

## 2022-02-07 MED ADMIN — sodium chloride 7% NEBULIZER solution 4 mL: 4 mL | RESPIRATORY_TRACT | @ 13:00:00

## 2022-02-07 MED ADMIN — albuterol 2.5 mg /3 mL (0.083 %) nebulizer solution 2.5 mg: 2.5 mg | RESPIRATORY_TRACT

## 2022-02-07 MED ADMIN — dornase alfa (PULMOZYME) 1 mg/mL solution 2.5 mg: 2.5 mg | RESPIRATORY_TRACT | @ 13:00:00

## 2022-02-07 MED ADMIN — insulin lispro (HumaLOG) injection 0-20 Units: 0-20 [IU] | SUBCUTANEOUS | @ 22:00:00

## 2022-02-07 MED ADMIN — pediatric multivitamin-vit D3 1,500 unit-vit K 800 mcg (MVW COMPLETE FORMULATION) capsule: 2 | ORAL | @ 15:00:00

## 2022-02-07 MED ADMIN — albuterol 2.5 mg /3 mL (0.083 %) nebulizer solution 2.5 mg: 2.5 mg | RESPIRATORY_TRACT | @ 13:00:00

## 2022-02-07 MED ADMIN — oxyCODONE (ROXICODONE) immediate release tablet 5 mg: 5 mg | ORAL | @ 22:00:00 | Stop: 2022-02-20

## 2022-02-07 MED ADMIN — magnesium oxide (MAG-OX) tablet 400 mg: 400 mg | ORAL | @ 22:00:00 | Stop: 2022-02-07

## 2022-02-07 MED ADMIN — sodium chloride 7% NEBULIZER solution 4 mL: 4 mL | RESPIRATORY_TRACT | @ 16:00:00

## 2022-02-07 MED ADMIN — pramipexole (MIRAPEX) tablet 0.25 mg: .25 mg | ORAL | @ 02:00:00

## 2022-02-07 MED ADMIN — magnesium oxide (MAG-OX) tablet 400 mg: 400 mg | ORAL | @ 19:00:00 | Stop: 2022-02-07

## 2022-02-07 MED ADMIN — piperacillin-tazobactam (ZOSYN) IVPB (premix) 4.5 g: 4.5 g | INTRAVENOUS | @ 16:00:00 | Stop: 2022-02-20

## 2022-02-07 MED ADMIN — elexacaftor-tezacaftor-ivacaft (TRIKAFTA) tablet 2 tablet **Patient Supplied**: 2 | ORAL | @ 15:00:00

## 2022-02-07 MED ADMIN — insulin lispro (HumaLOG) injection 0-20 Units: 0-20 [IU] | SUBCUTANEOUS | @ 17:00:00

## 2022-02-07 MED ADMIN — piperacillin-tazobactam (ZOSYN) IVPB (premix) 4.5 g: 4.5 g | INTRAVENOUS | @ 10:00:00 | Stop: 2022-02-20

## 2022-02-07 MED ADMIN — colistimethate (COLYMYCIN) 150 mg in sodium chloride (NS) 4 mL inhalation: 150 mg | RESPIRATORY_TRACT | @ 13:00:00

## 2022-02-07 MED ADMIN — elexacaftor-tezacaftor-ivacaft (TRIKAFTA) tablet 1 tablet **Patient Supplied**: 1 | ORAL | @ 22:00:00

## 2022-02-07 MED ADMIN — sodium chloride 7% NEBULIZER solution 4 mL: 4 mL | RESPIRATORY_TRACT | @ 02:00:00

## 2022-02-07 MED ADMIN — FLUoxetine (PROzac) capsule 60 mg: 60 mg | ORAL | @ 14:00:00

## 2022-02-07 NOTE — Unmapped (Addendum)
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 *           Christian Young is a 36 y.o. male who presented to [facility] with Cystic fibrosis exacerbation (CMS-HCC).      Cystic fibrosis exacerbation: Recent rhinovirus on course of PO Levaquin and month of inhaled colistin who presented to the ED with worsening fatigue, dyspnea and increased sputum production.  RVP negative, CF sputum culture negative.  Chest x-ray with streaky right lower lung opacity, likely atelectasis.  Pulmonology consulted.  -Continue IV Zosyn x14 days (6/27-7/10), inhaled colistin x28 days (6/15-7/12)  -albuterol nebs, 7% hypertonic saline, chest vest 4 times daily for airway clearance  -Continue home Trikafta, Pulmozyme, Breo Ellipta  -Follow-up PFTs ordered 6/28  -Weekly CRP/ESR, CBC w/ diff, CMP  - Follow pulmonology recs    CF related sinus disease: Saw Dr. Ralene Ok as an outpatient 6/15 who recommended consideration of inpatient surgery if/when patient was admitted. Repeat CT sinus this admission showed near complete opacification of the bilateral maxillary sinuses, although the ostiomeatal units are patent bilaterally.   - ENT consulted, follow up recommendations  - will likely consult heme for anticoagulation plan as below  -Continue home Flonase, Claritin, Singulair     CF related diabetes, poorly-controlled: A1c 11.3 on admission, up from 10.6 in March.  Uses an insulin pump at home.  During last hospitalization received Lantus 45 units nightly and lispro 20 units TID AC.  -Continue previous regimen of Lantus 45 U at bedtime, lispro 20 U TID AC, SSI  - Consider endocrine consult if BG not well controlled  -Follow-up repeat hemoglobin A1c  - outpatient endocrine follow up for optimization of BG    Elevated liver enzymes: Has had  mild elevations in the past which may be related to fatty liver disease.  Currently stable/improved  -AM CMP    History of DVT: In brief review of care everywhere appears to have recurrent line associated DVT and has been on lifelong anticoagulation in the setting of port.  Currently holding home Xarelto while anticipating surgery.  -Enoxaparin 1 mg/kg twice daily for now  -Consider hematology consult pending surgical plans    CF related pancreatic insufficiency:  -Continue home Creon    Mood disorder:   -Continue home fluoxetine, amitriptyline, lamotrigine,     Hypertension:   -Continue home lisinopril    GERD:  -Continue formulary equivalent of home PPI    Restless leg syndrome:   -Continue home Mirapex    Chronic back pain: Patient reports he is no longer taking gabapentin    Dispo: It uncertain if patient may be a candidate for home IV antibiotics; will need to reassess later in his stay once ENT plan is clear.  Please see the note from Dr. [name], primary pulmonologist, dated 6/27 for details of care coordination prior to consideration of home infusion.    I personally spent 120 minutes face-to-face and non-face-to-face in the care of this patient, which includes all pre, intra, and post visit time  on the date of service.  All documented time was specific to the E/M visit and does not include any procedures that may have been performed.     ___________________________________________________________________    Subjective:  Lying in bed, feeling a little worse than on presentation, partially from lack of sleep.       Labs/Studies:  Labs and Studies from the last 24hrs per EMR and Reviewed    Objective:  Temp:  [36.5 ??C (97.7 ??F)-36.9 ??C (98.4 ??F)] 36.9 ??C (98.4 ??F)  Heart Rate:  [88-96] 96  Resp:  [17-20] 18  BP: (84-126)/(53-97) 104/68  SpO2:  [94 %-99 %] 94 %    GEN: Lying in bed, appears ill but NAD  HEENT: sclera mildly erythematous, moist mucous membranes  CV: RRR, no murmur  RESP: CTAB, normal WOB  ABD: soft, non-tender, BS normoactive, non-distended  EXT: no LE edema  NEURO: non-focal

## 2022-02-07 NOTE — Unmapped (Signed)
Problem: Adult Inpatient Plan of Care  Goal: Plan of Care Review  Outcome: Ongoing - Unchanged  Note: Pt slept well during the night; VSS; pt c/o pain during the night, prn oxy provided for relief (see MAR); safe environment maintained; call bell w/in reach; wctm.  Goal: Patient-Specific Goal (Individualized)  Outcome: Ongoing - Unchanged  Goal: Absence of Hospital-Acquired Illness or Injury  Outcome: Ongoing - Unchanged  Intervention: Identify and Manage Fall Risk  Recent Flowsheet Documentation  Taken 02/06/2022 2149 by Kendra Opitz, RN  Safety Interventions:   low bed   lighting adjusted for tasks/safety   isolation precautions   fall reduction program maintained   nonskid shoes/slippers when out of bed  Intervention: Prevent Skin Injury  Recent Flowsheet Documentation  Taken 02/06/2022 2149 by Kendra Opitz, RN  Skin Protection: adhesive use limited  Intervention: Prevent and Manage VTE (Venous Thromboembolism) Risk  Recent Flowsheet Documentation  Taken 02/06/2022 2149 by Kendra Opitz, RN  Activity Management: activity adjusted per tolerance  Goal: Optimal Comfort and Wellbeing  Outcome: Ongoing - Unchanged  Goal: Readiness for Transition of Care  Outcome: Ongoing - Unchanged  Goal: Rounds/Family Conference  Outcome: Ongoing - Unchanged     Problem: Infection  Goal: Absence of Infection Signs and Symptoms  Outcome: Ongoing - Unchanged

## 2022-02-07 NOTE — Unmapped (Signed)
[facility]   Care Management     Patient is a 36 y.o. admitted on 02/06/2022 for CF (cystic fibrosis) (CMS-HCC) [E84.9]  Pneumonia of right lung due to infectious organism, unspecified part of lung [J18.9]. Per review of the medical record and discussion with the treating team, the patient does not meet indicators for a full assessment at this time. CM will continue to assess for discharge needs and follow up, as indicated.    Jola Baptist February 07, 2022 10:15 AM

## 2022-02-07 NOTE — Unmapped (Addendum)
PHYSICAL THERAPY  Evaluation (02/07/22 1105)          Patient Name:  Christian Young       Medical Record Number: [id number]   Date of Birth: [birth year]  Sex: Male        Treatment Diagnosis: Deconditiong     Activity Tolerance: Tolerated treatment well     ASSESSMENT  Problem List: Shortness of breath, Decreased endurance      Assessment : Christian Young is a 36 y.o. male who presented to [facility] with Cystic fibrosis exacerbation (CMS-HCC). Pt presents to PT at high level of function and is independent with all mobility and transfers. Ambulated in room 200 ft with no AD, SBA, steady gait. Pt reports SOB, O2sat 99%RA, HR 108. Given CLOF, PLOF, age, pt wil not require further acute PT at this time, d/c. After a review of the personal factors, comorbidities, clinical presentation, and examination of the number of affected body systems, the patient presents as a low complexity case.      Today's Interventions: AM-PAC 24/24, eval, transfers, mobility, ambulation; Education: contact/droplet precautions, isolation precautions,, ambulation, POC           AM-PAC-6 click    Difficulty turning over In bed?: None - Modified Independent/Independent  Difficulty sitting down/standing up from chair with arms? : None - Modified Independent/Independent  Difficulty moving from supine to sitting on edge of bed?: None - Modified Independent/Independent  Help moving to and from bed from wheelchair?: None - Modified Independent/Independent  Help currently needed walking in a hospital room?: None - Modified Independent/Independent  Help currently needed climbing 3-5 steps with railing?: None - Modified Independent/Independent    Basic Mobility Score:  Basic Mobility Score 6 click: 24    6 click  Score (in points): % of Functional Impairment, Limitation, Restriction  6: 100% impaired, limited, restricted  7-8: At least 80%, but less than 100% impaired, limited restricted  9-13: At least 60%, but less than 80% impaired, limited restricted  14-19: At least 40%, but less than 60% impaired, limited restricted  20-22: At least 20%, but less than 40% impaired, limited restricted  23: At least 1%, but less than 20% impaired, limited restricted  24: 0% impaired, limited restricted                  PLAN  Planned Frequency of Treatment:  D/C Services for: D/C Services       Planned Interventions:  (D/C)     Post-Discharge Physical Therapy Recommendations:  Skilled PT services NOT indicated     PT DME Recommendations: None            Goals:   Patient and Family Goals: Return home     Long Term Goal #1: N/A        SHORT GOAL #1: N/A                      Prognosis:  Excellent  Positive Indicators: PLOF/CLOF  Barriers to Discharge: None     SUBJECTIVE  Patient reports: Agreeable to PT  Current Functional Status: Pt received and left in bed with call bell in reach. RN present.     Prior Functional Status: Pt reports that he was fully independent  Equipment available at home: None      Past Medical History:   Diagnosis Date    Anxiety     Asthma     Chronic pain disorder     Cystic  fibrosis (CMS-HCC)     Depression     Diabetes mellitus (CMS-HCC)     Hypertension     Lumbar radiculopathy 10/26/2020    Nonproductive cough 04/05/2018            Social History     Tobacco Use    Smoking status: Former     Types: e-Cigarettes    Smokeless tobacco: Never   Substance Use Topics    Alcohol use: Not Currently     Alcohol/week: 3.0 standard drinks     Types: 3 Glasses of wine per week       Past Surgical History:   Procedure Laterality Date    IR INSERT PORT AGE GREATER THAN 5 YRS  03/27/2019    IR INSERT PORT AGE GREATER THAN 5 YRS 03/27/2019 Rush Barer, MD IMG [facility]    PR REMOVAL OF LUNG,LOBECTOMY Right 03/29/2017    Procedure: REMOVAL OF LUNG, OTHER THAN PNEUMONECTOMY; SINGLE LOBE (LOBECTOMY);  Surgeon: Cherie Dark, MD;  Location: MAIN OR [facility];  Service: Thoracic    SINUS SURGERY               Family History   Problem Relation Age of Onset Bipolar disorder Mother     Depression Mother         Allergies: Aztreonam, [place] [aztreonam lysine], Cefepime, Other, Slo-bid 100, Tobramycin, and Banana                  Objective Findings  Precautions / Restrictions  Precautions: Isolation precautions (contact/droplet precautions)  Weight Bearing Status: Non-applicable  Required Braces or Orthoses: Non-applicable     Communication Preference: Verbal          Pain Comments: 7/10  Medical Tests / Procedures: Vital signs, chart review, lab tests, imaging  Equipment / Environment: Vascular access (PIV, TLC, Port-a-cath, PICC), Patient not wearing mask for full session (PT wearing mask for full session)     At Rest: BP 104/68, HR 96, O2sat 94%RA  With Activity: HR 108, O2sat 99%RA     Airway Clearance: Pt reported to use vest therapy 2x/day and ambulates     Living Situation  Living Environment: House  Lives With: Spouse  Home Living: One level home, Level entry, Tub/shower unit, Standard height toilet, Stairs to enter without rails  Number of Stairs to Enter (outside): 2      Cognition: WFL  Orientation: Oriented x4  Visual/Perception: Wears Glasses/Contacts     Skin Inspection: Intact where visualized     Upper Extremities  UE ROM: Right WFL, Left WFL  UE Strength: Right WFL, Left WFL    Lower Extremities  LE ROM: Right WFL, Left WFL  LE Strength: Right WFL, Left WFL     Coordination: WFL  Sensation: WFL  Balance: WFL  Posture: WFL      Bed Mobility: Supine to Sit  Supine to Sit assistance level: Independent     Transfers: Sit to Stand  Sit to Stand assistance level: Independent      Gait Level of Assistance: Independent  Gait Assistive Device: None  Gait Distance Ambulated (ft): 200 ft  Gait: Pt ambulated in room; steady, slow pace     Stairs: NT      Wheelchair Mobility: N/A     Endurance: Fair     Physical Therapy Session Duration  PT Individual [mins]: 15     Medical Staff Made Aware: RN     I attest that I have reviewed  the above information.  Signed: Fredia Beets, PT  Filed 02/07/2022     The care for this patient was completed by Kemper Heupel, PT:  A student was present and participated in the care. Licensed/Credentialed therapist was physically present and immediately available to direct and supervise tasks that were related to patient management. The direction and supervision was continuous throughout the time these tasks were performed.    Christian Young, PT

## 2022-02-07 NOTE — Unmapped (Signed)
Problem: Adult Inpatient Plan of Care  Goal: Plan of Care Review  Outcome: Ongoing - Unchanged  Goal: Patient-Specific Goal (Individualized)  Outcome: Ongoing - Unchanged  Goal: Absence of Hospital-Acquired Illness or Injury  Outcome: Ongoing - Unchanged  Intervention: Prevent Skin Injury  Recent Flowsheet Documentation  Taken 02/07/2022 1635 by Joneen Boers, RN  Skin Protection: adhesive use limited  Goal: Optimal Comfort and Wellbeing  Outcome: Ongoing - Unchanged  Goal: Readiness for Transition of Care  Outcome: Ongoing - Unchanged  Goal: Rounds/Family Conference  Outcome: Ongoing - Unchanged     Problem: Infection  Goal: Absence of Infection Signs and Symptoms  Outcome: Ongoing - Unchanged

## 2022-02-07 NOTE — Unmapped (Signed)
Report called to RN [facility]) all questions answered. RN aware that CAC will bring patient over after shift change

## 2022-02-07 NOTE — Unmapped (Cosign Needed)
Otolaryngology Consult Note      Requesting Attending Physician:  Terri Piedra, [place]*  Service Requesting Consult:  Med Bernita Raisin [facility])    Assessment/Recommendations:  36 year old male with history of asthma, diabetes, hypertension, cystic fibrosis and chronic rhinosinusitis who was transferred for admission due to cystic fibrosis exacerbation.  ENT consulted for work-up of chronic rhinosinusitis and possible revision sinus surgery.    - Patient seen in clinic on 6/15, discussed at that time if patient was readmitted could potentially evaluate for add on revision surgery.  - CT sinus completed, bilateral maxillary sinus opacification, scattered sinus disease, although though frontal sinuses appear patent.  - Plan discussed with primary team, will plan for revision sinus surgery scheduled tentatively for 6/30.  Pending medicine goal stability/any significant changes can always postpone.  - Recommend continued IV antibiotics  - Recommend holding anticoagulation the morning of surgery, and ideally for 5 days postoperatively.  Primary team to discuss with heme-onc.    The patient will be staffed with Junius Finner MD  Thank you for inviting [name] to assist in the care of your patient.  Please page the Otolaryngology consult pager at [phone] with questions/concerns.    History of Present Illness  Christian Young is seen in consultation at the request of Terri Piedra, AG* for evaluation of sinusitis.     36 year old male with history of asthma, diabetes, hypertension, cystic fibrosis and chronic rhinosinusitis who was transferred for admission due to cystic fibrosis exacerbation.  He states that from a sinonasal standpoint he continues to be on Trikafta, and has been using saline irrigations and Flonase twice daily.  He states that overall his sinonasal symptoms have been stable although has noticed some increased drainage recently.  He states that thicker drainage is unusual for him.  He denies any acute changes in his vision.  He does have a history of DVT, and is on rivaroxaban in the outpatient setting that was transitioned to Lovenox at a therapeutic dosage while inpatient.  Patient underwent CT sinus yesterday in order to evaluate for possible operative management while he is inpatient.      Past Medical History   has a past medical history of Anxiety, Asthma, Chronic pain disorder, Cystic fibrosis (CMS-HCC), Depression, Diabetes mellitus (CMS-HCC), Hypertension, Lumbar radiculopathy (10/26/2020), and Nonproductive cough (04/05/2018).    Past Surgical History   has a past surgical history that includes pr removal of lung,lobectomy (Right, 03/29/2017); IR Insert Port Age Greater Than 5 Years (03/27/2019); and Sinus surgery.    Family History  family history includes Bipolar disorder in his mother; Depression in his mother.    Social History:  Tobacco use:   Social History     Tobacco Use   Smoking Status Former   ??? Types: e-Cigarettes   Smokeless Tobacco Never     Alcohol use:   Social History     Substance and Sexual Activity   Alcohol Use Not Currently   ??? Alcohol/week: 3.0 standard drinks   ??? Types: 3 Glasses of wine per week     Drug use:   Social History     Substance and Sexual Activity   Drug Use No       Allergies  Allergies   Allergen Reactions   ??? Aztreonam Anaphylaxis, Hives, Nausea And Vomiting and Rash     fevers  Patient stated that he only vomited x1 with [place] in the past.????    fevers  Patient stated that he only vomited x1  with [place] in the past.????   ??? [place] [Aztreonam Lysine] Anaphylaxis   ??? Cefepime Itching, Nausea Only and Other (See Comments)     Headaches also   ??? Other Anaphylaxis and Other (See Comments)     Other reaction(s): Other (See Comments)  Bananas: itchy throat  Slo Bid record from [employer] states anaphylaxis.????Pt states this was from childhood and does not know reaction.  Bananas, causes itchy throat   ??? Slo-Bid 100 Anaphylaxis   ??? Tobramycin Tinnitus     From OSH record-documented as tinnitus but has received IV tobra with close monitoring.  Pt has received Tobramycin at [facility] since this allergy documented    From OSH record-documented as tinnitus but has received IV tobra with close monitoring.   ??? Banana Itching and Nausea And Vomiting       Medications    No current facility-administered medications on file prior to encounter.     Current Outpatient Medications on File Prior to Encounter   Medication Sig Dispense Refill   ??? acetaminophen (TYLENOL) 500 MG tablet Take 1 tablet (500 mg total) by mouth every six (6) hours as needed. 30 tablet 0   ??? albuterol (PROVENTIL HFA;VENTOLIN HFA) 90 mcg/actuation inhaler Inhale 2 puffs every six (6) hours as needed. 1 Inhaler 1   ??? albuterol 2.5 mg /3 mL (0.083 %) nebulizer solution Inhale 3 mL (2.5 mg total) by nebulization two (2) times a day. 540 mL 3   ??? alcohol swabs (ALCOHOL PREP PADS) PadM Use as directed with inhaled antibiotics 100 each 11   ??? amitriptyline (ELAVIL) 100 MG tablet TAKE ONE TABLET BY MOUTH AT BEDTIME (Patient taking differently: Take 1 tablet (100 mg total) by mouth nightly.) 30 tablet 1   ??? atorvastatin (LIPITOR) 40 MG tablet Take 1 tablet (40 mg total) by mouth nightly.     ??? blood sugar diagnostic (GLUCOSE BLOOD) Strp by Other route Three (3) times a day with a meal. Rx sent to [facility] 01/04/20     ??? blood-glucose meter kit Dispense meter that is preferred by patient's insurance company 1 each 0   ??? blood-glucose meter,continuous (DEXCOM G6 RECEIVER) Misc Use as directed 1 each 0   ??? blood-glucose sensor (DEXCOM G6 SENSOR) Devi Use sq as directed every 10 days 9 each 11   ??? blood-glucose transmitter (DEXCOM G6 TRANSMITTER) Devi Use as directed, replace every 90 days 1 each 11   ??? budesonide-formoteroL (SYMBICORT) 160-4.5 mcg/actuation inhaler Inhale 2 puffs Two (2) times a day. 30.6 g 3   ??? colistimethate (COLYMYCIN) 150 mg injection Inhale 2 mL (150 mg total) Two (2) times a day, alternating 28 days on and 28 days off. Inject 2mL SWFI into colistin vial & gently mix, then withdraw 2mL (150mg ) for dose. 120 mL 6   ??? colistimethate (COLYMYCIN) 150 mg injection Inhale 2 mL (150 mg total) Two (2) times a day, alternating 28 days on and 28 days off. Inject 2mL SWFI into colistin vial & gently mix, then withdraw 2mL (150mg ) for dose. 120 mL 5   ??? EASY TOUCH LANCING DEVICE Misc Use as directed.     ??? EASY TOUCH TWIST LANCETS 30 gauge Misc Use as directed.     ??? elexacaftor-tezacaftor-ivacaft (TRIKAFTA) 100-50-75 mg(d) /150 mg (n) tablet Take 2 Tablets (orange) by mouth in the morning and 1 tablet (blue) in the evening with fatty food 84 tablet 5   ??? empty container Misc Use as directed to dispose of syringes  1 each 0   ??? FLUoxetine 60 mg Tab Take 60 mg by mouth daily.      ??? fluticasone propionate (FLONASE) 50 mcg/actuation nasal spray 2 sprays into each nostril daily. (Patient taking differently: 1 spray into each nostril daily.)     ??? FREESTYLE LIBRE 14 DAY SENSOR kit Use 3-4 times daily     ??? insulin ASPART (NOVOLOG U-100 INSULIN ASPART) 100 unit/mL injection Subcutaneously infuse up to 150 units daily via insulin pump 150 mL 11   ??? insulin pump cart,automated,BT (OMNIPOD 5 G6 PODS, GEN 5,) Crtg 1 each by subcutaneous (via wearable injector) route every three (3) days. Change every 72 hour 10 each 12   ??? lamoTRIgine (LAMICTAL) 200 MG tablet Take 2 tablets (400 mg total) by mouth daily.     ??? levocetirizine (XYZAL) 5 MG tablet Take 1 tablet (5 mg total) by mouth every evening. 90 tablet 3   ??? levoFLOXacin (LEVAQUIN) 750 MG tablet Take 1 tablet (750 mg total) by mouth daily for 14 days. 14 tablet 0   ??? lipase-protease-amylase (CREON) 36,000-114,000- 180,000 unit CpDR Take 8 caps by mouth with meals three times daily and 4 caps with snacks up to three times a day.     ??? lisinopriL (PRINIVIL,ZESTRIL) 10 MG tablet Take 1 tablet (10 mg total) by mouth daily. 30 tablet 0   ??? montelukast (SINGULAIR) 10 mg tablet Take 1 tablet (10 mg total) by mouth nightly. 90 tablet 3   ??? MVW COMPLETE FORMUL PROBIOTIC 40 billion cell -15 mg CpDR Take 1 capsule by mouth daily. (Patient taking differently: Take 1 capsule by mouth in the morning.) 90 capsule 3   ??? MVW Complete, pediatric multivit 61-D3-vit K, (MVW COMPLETE FORMULATION) 1,500-800 unit-mcg cap Take 2 capsules by mouth Two (2) times a day. (Patient taking differently: Take 2 capsules by mouth daily.)     ??? nebulizers (LC PLUS) Misc use as directed with nebulized medications twice daily. 4 each 4   ??? nebulizers (PARI LC D NEBULIZER) Misc Use with inhaled medication. 1 each 6   ??? nebulizers Misc Use as directed with inhaled medications 4 each 3   ??? omeprazole (PRILOSEC) 20 MG capsule Take 1 capsule (20 mg total) by mouth daily. 90 capsule 3   ??? pen needle, diabetic (BD ULTRA-FINE NANO PEN NEEDLE) 32 gauge x 5/32 (4 mm) Ndle use up to 4 times daily 400 each 12   ??? pramipexole (MIRAPEX) 0.125 MG tablet Take 2 tablets (0.25 mg total) by mouth nightly.     ??? PULMOZYME 1 mg/mL nebulizer solution Inhale 2.5 mg daily. 225 mL 3   ??? sodium chloride 7% 7 % Nebu Inhale 4 mL by nebulization two (2) times a day.     ??? sterile water Soln Use 2 mL to mix colistin, then add another 1 mL SWFI into neb cup along with mixed colistin. 600 mL 6   ??? syringe with needle (BD LUER-LOK SYRINGE) 3 mL 21 gauge x 1 Syrg For use with inhaled antibiotic 60 each 6   ??? traZODone (DESYREL) 150 MG tablet Take 1 tablet (150 mg total) by mouth nightly.     ??? XARELTO 20 mg tablet Take 1 tablet (20 mg total) by mouth daily.         Review of Systems  Review of Systems:  As above and, otherwise the balance of 11 systems was negative.    Objective:     Vital Signs  Temp:  [36.5 ??  C (97.7 ??F)-36.9 ??C (98.4 ??F)] 36.5 ??C (97.7 ??F)  Heart Rate:  [88-96] 93  Resp:  [17-20] 18  BP: (84-126)/(53-97) 112/84  MAP (mmHg):  [64-92] 77  SpO2:  [94 %-99 %] 95 %    Physical Exam  Constitutional:  Vitals reviewed on nursing chart  General: sitting up in NAD, normal appearance and voice  Respiration:  breathing comfortably on room air; no stridor/stertor  Eyes:  EOM Intact, sclera anicteric, no conjunctival injection  Neuro:  Alert and oriented times 3, Cranial nerves 2-12 intact and symmetric bilaterally.   Head and Face:  Skin with no masses or lesions, sinuses nontender to palpation, facial nerve fully intact, sensation intact throughout all distribution of CNV  Ears:  Normal external ears, normal hearing to whispered voice.   Nose:  External nose midline, anterior rhinoscopy is normal with limited visualization just to the anterior interior turbinate.  See scope exam.  Oral Cavity/Lips: normal mucosa, no lesions, tongue mobile, healthy dentition and gingiva  Neck/Lymph:  Neck soft, flat, trachea midline.      PROCEDURE: SINONASAL ENDOSCOPY (CPT G5073727):   To better evaluate the patient's symptoms/site, sinonasal endoscopy is indicated.  After discussion of risks and benefits verbal consent was obtained. An endoscope was used to perform nasal endoscopy on each side. A time out identifying the patient, the procedure, the location of the procedure and any concerns was performed prior to beginning the procedure.    Findings:   Examination on the left reveals an intact nasal septum with no associated masses, lesions, or friable mucosa.  Evidence of previous sinus surgery, maxillary sinus is open with polypoid inflammation.  Mild crusting no active purulence.    Examination on the right reveals an intact nasal septum with no associated masses, lesions, or friable mucosa.  Evidence of previous surgery, maxillary sinus is open with polypoid inflammation.      Test Results  Labs:   Recent Results (from the past 24 hour(s))   POCT Glucose    Collection Time: 02/06/22  5:36 PM   Result Value Ref Range    Glucose, POC 459 (HH) 70 - 179 mg/dL   Hepatic Function Panel    Collection Time: 02/06/22  9:18 PM   Result Value Ref Range    Albumin 4.2 3.4 - 5.0 g/dL    Total Protein 7.4 5.7 - 8.2 g/dL    Total Bilirubin 0.5 0.3 - 1.2 mg/dL    Bilirubin, Direct 1.61 0.00 - 0.30 mg/dL    AST 42 (H) <=09 U/L    ALT 63 (H) 10 - 49 U/L    Alkaline Phosphatase 116 46 - 116 U/L   POCT Glucose    Collection Time: 02/06/22  9:21 PM   Result Value Ref Range    Glucose, POC 260 (H) 70 - 179 mg/dL   Respiratory Pathogen Panel with COVID-19    Collection Time: 02/07/22  3:21 AM   Result Value Ref Range    Adenovirus Not Detected Not Detected    Coronavirus HKU1 Not Detected Not Detected    Coronavirus NL63 Not Detected Not Detected    Coronavirus 229E Not Detected Not Detected    Coronavirus OC43 PCR Not Detected Not Detected    Metapneumovirus Not Detected Not Detected    Rhinovirus/Enterovirus Not Detected Not Detected    Influenza A Not Detected Not Detected    Influenza B Not Detected Not Detected    Parainfluenza 1 Not Detected Not Detected    Parainfluenza  2 Not Detected Not Detected    Parainfluenza 3 Not Detected Not Detected    Parainfluenza 4 Not Detected Not Detected    RSV Not Detected Not Detected    Bordetella pertussis Not Detected Not Detected    Bordetella parapertussis Not Detected Not Detected    Chlamydophila (Chlamydia) pneumoniae Not Detected Not Detected    Mycoplasma pneumoniae Not Detected Not Detected    SARS-CoV-2 PCR Not Detected Not Detected   Basic metabolic panel    Collection Time: 02/07/22  6:17 AM   Result Value Ref Range    Sodium 134 (L) 135 - 145 mmol/L    Potassium 3.9 3.4 - 4.8 mmol/L    Chloride 98 98 - 107 mmol/L    CO2 29.0 20.0 - 31.0 mmol/L    Anion Gap 7 5 - 14 mmol/L    BUN 10 9 - 23 mg/dL    Creatinine 1.61 0.96 - 1.10 mg/dL    BUN/Creatinine Ratio 16     eGFR CKD-EPI (2021) Male >90 >=60 mL/min/1.39m2    Glucose 228 (H) 70 - 179 mg/dL    Calcium 8.8 8.7 - 04.5 mg/dL   PT-INR    Collection Time: 02/07/22  6:17 AM   Result Value Ref Range    PT 12.0 9.8 - 12.8 sec    INR 1.06    CBC    Collection Time: 02/07/22  6:17 AM   Result Value Ref Range    WBC 6.5 3.6 - 11.2 10*9/L    RBC 5.04 4.26 - 5.60 10*12/L    HGB 13.8 12.9 - 16.5 g/dL    HCT 40.9 81.1 - 91.4 %    MCV 79.7 77.6 - 95.7 fL    MCH 27.4 25.9 - 32.4 pg    MCHC 34.4 32.0 - 36.0 g/dL    RDW 78.2 (H) 95.6 - 15.2 %    MPV 8.5 6.8 - 10.7 fL    Platelet 207 150 - 450 10*9/L   Addon Differential Only    Collection Time: 02/07/22  6:17 AM   Result Value Ref Range    Neutrophils % 82.7 %    Lymphocytes % 5.1 %    Monocytes % 10.0 %    Eosinophils % 1.4 %    Basophils % 0.8 %    Absolute Neutrophils 5.4 1.8 - 7.8 10*9/L    Absolute Lymphocytes 0.3 (L) 1.1 - 3.6 10*9/L    Absolute Monocytes 0.6 0.3 - 0.8 10*9/L    Absolute Eosinophils 0.1 0.0 - 0.5 10*9/L    Absolute Basophils 0.0 0.0 - 0.1 10*9/L    Microcytosis Slight (A) Not Present   Magnesium Level    Collection Time: 02/07/22  6:17 AM   Result Value Ref Range    Magnesium 1.3 (L) 1.6 - 2.6 mg/dL   Hemoglobin O1H    Collection Time: 02/07/22  6:17 AM   Result Value Ref Range    Hemoglobin A1C 11.3 (H) 4.8 - 5.6 %    Estimated Average Glucose 278 mg/dL   POCT Glucose    Collection Time: 02/07/22  7:54 AM   Result Value Ref Range    Glucose, POC 230 (H) 70 - 179 mg/dL   POCT Glucose    Collection Time: 02/07/22 11:37 AM   Result Value Ref Range    Glucose, POC 314 (H) 70 - 179 mg/dL        Imaging:   CT Sinus W Contrast    Result Date: 02/07/2022  EXAM: Computed tomography, sinus, with contrast material DATE: 02/06/2022 5:32 PM ACCESSION: [id number] DICTATED: 02/06/2022 10:19 PM INTERPRETATION LOCATION: [facility] CLINICAL INDICATION: 36 years old Male with sinusitis, Cystic fibrosis   CT sinus maxillofacial for planning for possible sinus surgery with CF  COMPARISON: 06/08/2021 CT head TECHNIQUE: Axial CT images through the paranasal sinuses with contrast. Coronal and sagittal reformatted images are provided. FINDINGS:  Sequela of prior sinus surgery bilateral maxillary antrostomies. Frontal sinuses are clear. There is reactive osteitis of the frontal bone surrounding the frontal sinuses. Frontal recesses are patent. Partial opacification of the bilateral ethmoid air cells. Sphenoid sinuses are clear. Sphenoethmoidal recesses are patent. Near-complete opacification of the bilateral maxillary sinuses. Ostiomeatal units are patent. Leftward deviation of the posterior nasal septum with bony spur. No skull base dehiscence. There is no abnormal enhancement.     Sequela of prior sinus surgery with bilateral maxillary antrostomies. There is near complete opacification of the bilateral maxillary sinuses, although the ostiomeatal units are patent bilaterally.         _____________    Note - This record has been created using [employer]. Chart creation errors have been sought, but may not always have been located. Such creation errors do not reflect on the standard of medical care.

## 2022-02-07 NOTE — Unmapped (Cosign Needed)
General Pulmonary Team Initial Consult Note     Date of Service: 02/07/2022  Requesting Physician: Jeanene Erb, FNP   Requesting Service: Med [facility])  Reason for consultation: Comprehensive evaluation of Cystic Fibrosis Flare and Sinus Infection.    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 male with with cystic fibrosis (S945L and [phone] insertion) on trikafta, pancreatic insufficiency, CFRD on insulin, mood disorder, recent rhinovirus infection on course of PO levaquin and month of inhaled colistin - presented to ED with worsening fatigue, ongoing green sputum production.  He presented to the emergency department for further evaluation for CF exacerbation given persistence of symptoms despite oral antibiotics and concern for CF flare documenting a fever at home of 101.  He denies any chest pain or pleurisy, hemoptysis or sick contacts.  In addition patient endorsing bilateral frontal sinus tenderness and denying any nasal drainage.  At home was not utilizing any sinus rinses.    Of note patient uses insulin pump at home but did not bring with him to the hospital.  He does have his home Trikafta and pancreatic enzymes.    Problems addressed during this consult include CF Flare, CF sinus infection, CF diabetes, CF pancreatic insufficiency        Assessment      Interval Events: None       Impression:    Christian Young is a 36 y.o. male with with cystic fibrosis [phone] and [phone] insertion) on trikafta, pancreatic insufficiency, CFRD on insulin, mood disorder, recent rhinovirus infection on course of PO levaquin and month of inhaled colistin - presented to ED with worsening fatigue, ongoing green sputum production. Patient presents to [facility] at request of primary pulmonologist for treatment of CF flare and sinus infection. Based on prior culture data positive for Muccoid and smooth pseudomonas will be covered by Zosyn and inhaled colistin. Agreesive airway clearance as below. Will obtain baseline PFT. Regarding Sinus infection, will need consult with ENT and CT SINUS when able in addition to Hematology consult for management of long term anticoagulation for history of recurrent blood clots currently on DOAC      Recommendations     CF Flare l CF Sinus Infection  ??? Based on prior culture data (01/25/22) and allergy profile, favor treating with IV zosyn (likely 14 days) and inhaled colistin (full 28 day course started on 6/15).  ??? Patient has a PORT - does not need a PICC line   ??? Plan for likely 2 weeks of IV antibiotics. Duration to be determined based on clinical response and improvement in inflammatory markers and potential role for ENT sinus surgery (Dr. Ralene Ok - see recent clinic note), and ability to tolerate airway clearance and pain control post op   ??? Nebulized hypertonic saline 7%; pre-treatment with albuterol 4 x daily per RT  ??? Pulmozyme once daily   ??? For mechanical clearance, prefer vest 4x daily depending on positioning of accessed port, but would encourage him to trial other forms of airway clearance he may be interested in  ???  Of note patient not on chonic azithromycin due to hx of NTM.    ??? Continue BID isotoninc nasal irrigation and nasal steroid sprays (2 sprays BID each nostril)  ??? CT Sinus  ??? Consult ENT (Kim[facility]) - to determine timing of sinus surgery (see last clinic note)   ??? On admission, CBC-D, CMP, CRP, ESR, CF sputum culture (NOT Lower resp culture) for bacterial and AFB culture (NTM screening), RPP, CXR, PT/INR and any other testing the examining providers deem appropriate  ??? Weekly CRP and ESR to trend (including as outpatient).  ??? Baseline PFT (ordered    Hx of Blood Clots  ??? Is on DOAC for history of blood clots (xarelto) - see discussion to consult heme pending any surgery plans with ENT    ??? Consult hematology    CF Pancreatic Insufficiency and Diabetes  ??? Consults endocrinology for titration of insulin while inpatient since he does not have his home insulin pump  ??? Continue home enzymes    CF Constipation  - Ensure daily bowel movement; can supplement with Miralax.             This patient was seen and evaluated with Dr.Olivier. Primary team updated vis epic chat. Please do not hesitate to page [phone] (gen pulmonary consult fellow) with questions. We appreciate the opportunity to assist in the care of this patient. We look forward to following with you.    Purvis Kilts, MD    Subjective & Objective     Pertinent exam findings:   General appearance - well appearing, well nourished, and in no distress  Eyes - EOMI, PERRLA, anicteric sclerae, pink conjunctiva, bl frontal sinus tenderness  Mouth - moist mucous membranes, no pharyngeal erythema or exudates  Neck - Trachea midline, normal neck movement  Lymphatics - no palpable lymphadenopathy  Heart - normal rate, regular rhythm, normal S1, S2, no murmurs, rubs, clicks or gallops, regular rate and rhythm, normal S1/S2, no gallops, rubs, or murmurs  Chest - equal expansion, clear to auscultation, no wheezes, rhonchi, or rales  Abdomen - soft, nontender, nondistended, no masses or organomegaly  Extremities - No lower extremity edema, no clubbing or cyanosis  Skin - normal coloration and turgor, no rashes, no suspicious skin lesions noted  Neurological - alert, oriented, normal speech, no focal findings or movement disorder noted    Relevant Imaging:  - CXR on 02/06/22: RLL opacity which can represent atelectasis      Vitals - past 24 hours  Temp:  [36.5 ??C (97.7 ??F)-36.9 ??C (98.4 ??F)] 36.9 ??C (98.4 ??F)  Heart Rate: [88-96] 91  Resp:  [17-24] 17  BP: (84-126)/(53-102) 99/97  SpO2:  [94 %-99 %] 94 % Intake/Output  I/O last 3 completed shifts:  In: 690 [P.O.:480; I.V.:10; IV Piggyback:200]  Out: -       Arterial Blood Gas:   No results in the last day     Venous Blood Gas:   No results in the last day     Cultures:  No results found for: BLOOD CULTURE, URINE CULTURE, LOWER RESPIRATORY CULTURE  WBC (10*9/L)   Date Value   02/07/2022 6.5          Other Labs:  Lab Results   Component Value Date    WBC 6.5 02/07/2022    HGB 13.8 02/07/2022    HCT 40.2 02/07/2022    PLT 207 02/07/2022     Lab Results   Component Value Date    NA 134 (L)  02/07/2022    K 3.9 02/07/2022    CL 98 02/07/2022    CO2 29.0 02/07/2022    BUN 10 02/07/2022    CREATININE 0.61 02/07/2022    GLU 228 (H) 02/07/2022    CALCIUM 8.8 02/07/2022    MG 1.3 (L) 02/07/2022    PHOS 3.9 02/06/2022     Lab Results   Component Value Date    BILITOT 0.5 02/06/2022    BILIDIR 0.20 02/06/2022    PROT 7.4 02/06/2022    ALBUMIN 4.2 02/06/2022    ALT 63 (H) 02/06/2022    AST 42 (H) 02/06/2022    ALKPHOS 116 02/06/2022     Lab Results   Component Value Date    LABPROT 11.8 01/18/2020    INR 1.06 02/07/2022    APTT 31.6 11/03/2021       Allergies & Home Medications   Personally reviewed in Epic    Continuous Infusions:       Scheduled Medications:   ??? albuterol  2.5 mg Nebulization 4x Daily (RT)   ??? amitriptyline  100 mg Oral Nightly   ??? atorvastatin  40 mg Oral Nightly   ??? colistimetate (COLISTIN) inhalation  150 mg Inhalation Q12H [facility]   ??? dornase alfa  2.5 mg Inhalation Daily   ??? elexacaftor-tezacaftor-ivacaft  2 tablet Oral Daily    And   ??? elexacaftor-tezacaftor-ivacaft  1 tablet Oral Daily   ??? enoxaparin (LOVENOX) injection  1 mg/kg (Adjusted) Subcutaneous Q12H [facility]   ??? FLUoxetine  60 mg Oral Daily   ??? fluticasone furoate-vilanteroL  1 puff Inhalation Daily (RT)   ??? fluticasone propionate  2 spray Each Nare Daily   ??? insulin glargine  45 Units Subcutaneous Nightly   ??? insulin lispro  0-20 Units Subcutaneous ACHS   ??? insulin lispro  20 Units Subcutaneous TID AC   ??? lamoTRIgine  400 mg Oral Daily   ??? lisinopriL  10 mg Oral Daily   ??? loratadine  10 mg Oral Daily   ??? montelukast  10 mg Oral Nightly   ??? pancrelipase (Lip-Prot-Amyl)  12 capsule Oral 3xd Meals   ??? pantoprazole  20 mg Oral Daily   ??? MVW Complete (pediatric multivit 61-D3-vit K)  2 capsule Oral Daily   ??? piperacillin-tazobactam (ZOSYN) IV (intermittent)  4.5 g Intravenous Q6H   ??? polyethylene glycol  17 g Oral Daily   ??? pramipexole  0.25 mg Oral Nightly   ??? sod chlor-bicarb-squeez bottle  1 packet Each Nare BID   ??? sodium chloride 7%  4 mL Nebulization 4x Daily (RT)   ??? traZODone  150 mg Oral Nightly       PRN medications:  acetaminophen, aluminum-magnesium hydroxide-simethicone, dextrose in water, glucagon, glucose, guaiFENesin, oxyCODONE, pancrelipase (Lip-Prot-Amyl), senna

## 2022-02-07 NOTE — Unmapped (Signed)
Called the patient today to check. Brought his home chest vest for airway clearance. The respiratory dept is aware of the patients location. Encouraged the patient to reach out with any issues or concerns. Will continue to monitor.

## 2022-02-08 LAB — COMPREHENSIVE METABOLIC PANEL
ALBUMIN: 3.5 g/dL (ref 3.4–5.0)
ALKALINE PHOSPHATASE: 95 U/L (ref 46–116)
ALT (SGPT): 55 U/L — ABNORMAL HIGH (ref 10–49)
ANION GAP: 6 mmol/L (ref 5–14)
AST (SGOT): 34 U/L (ref <=34)
BILIRUBIN TOTAL: 0.3 mg/dL (ref 0.3–1.2)
BLOOD UREA NITROGEN: 12 mg/dL (ref 9–23)
BUN / CREAT RATIO: 16
CALCIUM: 7.9 mg/dL — ABNORMAL LOW (ref 8.7–10.4)
CHLORIDE: 101 mmol/L (ref 98–107)
CO2: 28 mmol/L (ref 20.0–31.0)
CREATININE: 0.74 mg/dL
EGFR CKD-EPI (2021) MALE: >90 mL/min/{1.73_m2} (ref >=60)
GLUCOSE RANDOM: 335 mg/dL — ABNORMAL HIGH (ref 70–179)
POTASSIUM: 3.6 mmol/L (ref 3.4–4.8)
PROTEIN TOTAL: 6.3 g/dL (ref 5.7–8.2)
SODIUM: 135 mmol/L (ref 135–145)

## 2022-02-08 LAB — BASIC METABOLIC PANEL
ANION GAP: 6 mmol/L (ref 5–14)
BLOOD UREA NITROGEN: 12 mg/dL (ref 9–23)
BUN / CREAT RATIO: 16
CALCIUM: 7.9 mg/dL — ABNORMAL LOW (ref 8.7–10.4)
CHLORIDE: 101 mmol/L (ref 98–107)
CO2: 28 mmol/L (ref 20.0–31.0)
CREATININE: 0.74 mg/dL
EGFR CKD-EPI (2021) MALE: >90 mL/min/{1.73_m2} (ref >=60)
GLUCOSE RANDOM: 335 mg/dL — ABNORMAL HIGH (ref 70–179)
POTASSIUM: 3.6 mmol/L (ref 3.4–4.8)
SODIUM: 135 mmol/L (ref 135–145)

## 2022-02-08 MED ADMIN — sodium chloride 7% NEBULIZER solution 4 mL: 4 mL | RESPIRATORY_TRACT | @ 13:00:00

## 2022-02-08 MED ADMIN — lipase-protease-amylase (CREON) 36,000-114,000- 180,000 unit capsule 288,000 units of lipase **Patient Supplied**: 8 | ORAL | @ 18:00:00

## 2022-02-08 MED ADMIN — lamoTRIgine (LaMICtal) tablet 400 mg: 400 mg | ORAL | @ 13:00:00

## 2022-02-08 MED ADMIN — oxyCODONE (ROXICODONE) immediate release tablet 5 mg: 5 mg | ORAL | @ 20:00:00 | Stop: 2022-02-20

## 2022-02-08 MED ADMIN — enoxaparin (LOVENOX) syringe 40 mg: 40 mg | SUBCUTANEOUS | @ 16:00:00 | Stop: 2022-02-08

## 2022-02-08 MED ADMIN — pantoprazole (PROTONIX) EC tablet 20 mg: 20 mg | ORAL | @ 13:00:00

## 2022-02-08 MED ADMIN — lisinopriL (PRINIVIL,ZESTRIL) tablet 10 mg: 10 mg | ORAL | @ 13:00:00

## 2022-02-08 MED ADMIN — lipase-protease-amylase (CREON) 36,000-114,000- 180,000 unit capsule 288,000 units of lipase **Patient Supplied**: 8 | ORAL | @ 13:00:00

## 2022-02-08 MED ADMIN — pediatric multivitamin-vit D3 1,500 unit-vit K 800 mcg (MVW COMPLETE FORMULATION) capsule: 2 | ORAL | @ 13:00:00

## 2022-02-08 MED ADMIN — sod chlor-bicarb-squeez bottle (NEILMED) nasal packet 1 packet: 1 | NASAL | @ 18:00:00 | Stop: 2022-02-13

## 2022-02-08 MED ADMIN — lipase-protease-amylase (CREON) 36,000-114,000- 180,000 unit capsule 288,000 units of lipase **Patient Supplied**: 8 | ORAL | @ 22:00:00

## 2022-02-08 MED ADMIN — insulin glargine (LANTUS) injection 45 Units: 45 [IU] | SUBCUTANEOUS | @ 02:00:00

## 2022-02-08 MED ADMIN — dornase alfa (PULMOZYME) 1 mg/mL solution 2.5 mg: 2.5 mg | RESPIRATORY_TRACT | @ 13:00:00

## 2022-02-08 MED ADMIN — colistimethate (COLYMYCIN) 150 mg in sodium chloride (NS) 4 mL inhalation: 150 mg | RESPIRATORY_TRACT | @ 13:00:00

## 2022-02-08 MED ADMIN — enoxaparin (LOVENOX) syringe 105 mg: 1 mg/kg | SUBCUTANEOUS | @ 02:00:00 | Stop: 2022-02-08

## 2022-02-08 MED ADMIN — piperacillin-tazobactam (ZOSYN) IVPB (premix) 4.5 g: 4.5 g | INTRAVENOUS | @ 16:00:00 | Stop: 2022-02-20

## 2022-02-08 MED ADMIN — atorvastatin (LIPITOR) tablet 40 mg: 40 mg | ORAL

## 2022-02-08 MED ADMIN — traZODone (DESYREL) tablet 150 mg: 150 mg | ORAL

## 2022-02-08 MED ADMIN — fluticasone furoate-vilanteroL (BREO ELLIPTA) 200-25 mcg/dose inhaler 1 puff: 1 | RESPIRATORY_TRACT | @ 13:00:00

## 2022-02-08 MED ADMIN — sodium chloride 7% NEBULIZER solution 4 mL: 4 mL | RESPIRATORY_TRACT | @ 17:00:00

## 2022-02-08 MED ADMIN — sodium chloride 7% NEBULIZER solution 4 mL: 4 mL | RESPIRATORY_TRACT

## 2022-02-08 MED ADMIN — piperacillin-tazobactam (ZOSYN) IVPB (premix) 4.5 g: 4.5 g | INTRAVENOUS | @ 22:00:00 | Stop: 2022-02-20

## 2022-02-08 MED ADMIN — piperacillin-tazobactam (ZOSYN) IVPB (premix) 4.5 g: 4.5 g | INTRAVENOUS | @ 10:00:00 | Stop: 2022-02-20

## 2022-02-08 MED ADMIN — albuterol 2.5 mg /3 mL (0.083 %) nebulizer solution 2.5 mg: 2.5 mg | RESPIRATORY_TRACT | @ 21:00:00

## 2022-02-08 MED ADMIN — insulin lispro (HumaLOG) injection 0-20 Units: 0-20 [IU] | SUBCUTANEOUS | @ 22:00:00

## 2022-02-08 MED ADMIN — insulin lispro (HumaLOG) injection 0-20 Units: 0-20 [IU] | SUBCUTANEOUS | @ 02:00:00

## 2022-02-08 MED ADMIN — piperacillin-tazobactam (ZOSYN) IVPB (premix) 4.5 g: 4.5 g | INTRAVENOUS | @ 04:00:00 | Stop: 2022-02-20

## 2022-02-08 MED ADMIN — oxyCODONE (ROXICODONE) immediate release tablet 5 mg: 5 mg | ORAL | @ 02:00:00 | Stop: 2022-02-20

## 2022-02-08 MED ADMIN — elexacaftor-tezacaftor-ivacaft (TRIKAFTA) tablet 1 tablet **Patient Supplied**: 1 | ORAL | @ 22:00:00

## 2022-02-08 MED ADMIN — albuterol 2.5 mg /3 mL (0.083 %) nebulizer solution 2.5 mg: 2.5 mg | RESPIRATORY_TRACT | @ 17:00:00

## 2022-02-08 MED ADMIN — colistimethate (COLYMYCIN) 150 mg in sodium chloride (NS) 4 mL inhalation: 150 mg | RESPIRATORY_TRACT

## 2022-02-08 MED ADMIN — albuterol 2.5 mg /3 mL (0.083 %) nebulizer solution 2.5 mg: 2.5 mg | RESPIRATORY_TRACT

## 2022-02-08 MED ADMIN — elexacaftor-tezacaftor-ivacaft (TRIKAFTA) tablet 2 tablet **Patient Supplied**: 2 | ORAL | @ 13:00:00

## 2022-02-08 MED ADMIN — pramipexole (MIRAPEX) tablet 0.25 mg: .25 mg | ORAL

## 2022-02-08 MED ADMIN — amitriptyline (ELAVIL) tablet 100 mg: 100 mg | ORAL

## 2022-02-08 MED ADMIN — loratadine (CLARITIN) tablet 10 mg: 10 mg | ORAL | @ 13:00:00

## 2022-02-08 MED ADMIN — insulin lispro (HumaLOG) injection 0-20 Units: 0-20 [IU] | SUBCUTANEOUS | @ 18:00:00

## 2022-02-08 MED ADMIN — montelukast (SINGULAIR) tablet 10 mg: 10 mg | ORAL

## 2022-02-08 MED ADMIN — FLUoxetine (PROzac) capsule 60 mg: 60 mg | ORAL | @ 13:00:00

## 2022-02-08 MED ADMIN — albuterol 2.5 mg /3 mL (0.083 %) nebulizer solution 2.5 mg: 2.5 mg | RESPIRATORY_TRACT | @ 13:00:00

## 2022-02-08 MED ADMIN — fluticasone propionate (FLONASE) 50 mcg/actuation nasal spray 2 spray: 2 | NASAL | @ 13:00:00

## 2022-02-08 MED ADMIN — sodium chloride 7% NEBULIZER solution 4 mL: 4 mL | RESPIRATORY_TRACT | @ 21:00:00

## 2022-02-08 NOTE — Unmapped (Signed)
Problem: Adult Inpatient Plan of Care  Goal: Plan of Care Review  Outcome: Ongoing - Unchanged  Note: Pt slept well during the night; VSS; pt c/o pain during the night, prn oxy provided for relief (see MAR); safe environment maintained; call bell w/in reach; wctm.   Goal: Patient-Specific Goal (Individualized)  Outcome: Ongoing - Unchanged  Goal: Absence of Hospital-Acquired Illness or Injury  Outcome: Ongoing - Unchanged  Intervention: Identify and Manage Fall Risk  Recent Flowsheet Documentation  Taken 02/07/2022 2023 by Kendra Opitz, RN  Safety Interventions:   low bed   lighting adjusted for tasks/safety   isolation precautions   fall reduction program maintained   nonskid shoes/slippers when out of bed  Intervention: Prevent Skin Injury  Recent Flowsheet Documentation  Taken 02/07/2022 2023 by Kendra Opitz, RN  Skin Protection: adhesive use limited  Intervention: Prevent and Manage VTE (Venous Thromboembolism) Risk  Recent Flowsheet Documentation  Taken 02/07/2022 2023 by Kendra Opitz, RN  Activity Management: activity adjusted per tolerance  Goal: Optimal Comfort and Wellbeing  Outcome: Ongoing - Unchanged  Goal: Readiness for Transition of Care  Outcome: Ongoing - Unchanged  Goal: Rounds/Family Conference  Outcome: Ongoing - Unchanged     Problem: Infection  Goal: Absence of Infection Signs and Symptoms  Outcome: Ongoing - Unchanged  Intervention: Prevent or Manage Infection  Recent Flowsheet Documentation  Taken 02/07/2022 2023 by Kendra Opitz, RN  Isolation Precautions:   contact precautions maintained   droplet precautions maintained

## 2022-02-08 NOTE — Unmapped (Signed)
Cystic Fibrosis Nutrition Assessment    Inpatient, In-person: Nutrition Risk Screening this admission for CF and related follow up  Primary Pulmonary Provider: Dr [name]  ===================================================================  Christian Young is a 36 y.o. male w/ PMH significant for cystic fibrosis, pancreatic insufficiency, diabetes who presented on 6/27 for CF exacerbation. Plan for sinus surgery tomorrow, 6/30.   ===================================================================  INTERVENTION:    1. Change diet to High Calorie High Protein    2. Typically recommend 5mg  Vit K 2x weekly while on IV abx; defer to team given anticoagulation and hx of DVT    3. Suggest changing snack dose of enzymes to Creon 36,000 x 4 caps  - continue meal dose of Creon 36,000 x 8 caps    4. Ordered fruit cups and pretzels in computrition per pt request    5. Continue remainder of nutrition regimen:  - bowel regimen as ordered  - insulin regimen per endocrinology  - MVW gel cap BID  - acid reducer; protonix      Inpatient:   Will follow up with patient per protocol: 1-2 times per week (and more frequent as indicated)  Monitor for potential discharge needs with multi-disciplinary team.   ===================================================================  ASSESSMENT:  Nutrition Category = Adult Obesity Class 1: BMI 30 to < 35 kg/m2    Estimated daily needs: 3284 kcal/day, 136-181 g PRO/day, 3368 mL fluid/day      Calories estimated using: Cystic Fibrosis Conference Formula, protein per DRI x 1.5-2, fluid per [facility]    Current diet is not appropriate for CF. Current PO intake is adequate to meet estimated CF needs. Patient continues to work towards goals for weight management.   Patient would benefit from adjustment in enzyme regimen. Vitamin prescription is appropriate to reach/maintain optimal fat soluble vitamin levels. Patient on CFTR modulator is consuming adequate amounts of fat-containing foods with prescribed medication to optimize absorption.    ASPEN/AND Malnutrition Screening:  Patient does not meet ASPEN/AND criteria for malnutrition at this time.    Goals:  1. Ongoing:  Meet estimated daily needs  2. Ongoing:  Reach/maintain established anthropometric goals for Adult CF: BMI < 30 kg/m2   3. Ongoing:  Normal fat-soluble vitamin levels: Vitamin A, Vitamin E and PT per lab range; Vitamin D 25OH total >30   4. Ongoing:  Maintain glucose control. Carbohydrate content of diet should comprise 40-50% of total calorie needs, but carbohydrates are not restricted in this population.    5. Ongoing:  Meet sodium needs for CF     Nutrition goals reviewed, and relevant barriers identified and addressed: none evident.   Patient is evaluated to have good  willingness and ability to achieve nutrition goals.   ===================================================================  INPATIENT:  Kojo Liby is admitted with CF (cystic fibrosis) (CMS-HCC) [E84.9]  Pneumonia of right lung due to infectious organism, unspecified part of lung [J18.9].    Current Nutrition Orders (inpatient):  Oral intake     CF Nutrition related medications (inpatient): Nutritionally relevant medications reviewed.   lipitor  trikafta  Insulin regimen per endo  Creon 36,000 (home supply) x 8 caps/meal  Creon 12,000 x 4 caps/snack  protonix  MVW complete formulation gel cap BID  IV abx  Miralax once daily    CF Nutrition related labs (inpatient): reviewed; PT WNL  ==================================================================  CLINICAL DATA:  Past Medical History:   Diagnosis Date    Anxiety     Asthma     Chronic pain disorder  Cystic fibrosis (CMS-HCC)     Depression     Diabetes mellitus (CMS-HCC)     Hypertension     Lumbar radiculopathy 10/26/2020    Nonproductive cough 04/05/2018     Patient Active Problem List   Diagnosis    Essential hypertension    Depressive disorder    Mood disorder (CMS-HCC)    Anxiety    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)    Chronic pansinusitis    Pancreatic insufficiency due to cystic fibrosis (CMS-HCC)    History of Mycobacterium abscessus infection    Bronchiectasis (CMS-HCC)    Chronic deep vein thrombosis (DVT) of lower extremity (CMS-HCC)    Cystic fibrosis exacerbation (CMS-HCC)    Obesity (BMI 30-39.9)    Restless leg syndrome    Degeneration of lumbar intervertebral disc    Lumbar radiculopathy    Idiopathic peripheral neuropathy    Long term current use of anticoagulant therapy    Acute non-recurrent pansinusitis    AKI (acute kidney injury) (CMS-HCC)    Hemoptysis    Fracture of multiple ribs of left side     Anthroprometric Evaluation:  Weight changes: Christian Young reports he last weighed 246 lbs at his PCP office a few weeks ago. Per EPIC documentation, last weighed that amount several months ago. Weight largely stable since March.  CFTR modulator and weight change: On Trikafta  BMI Readings from Last 3 Encounters:   02/07/22 31.99 kg/m??   01/25/22 32.14 kg/m??   01/25/22 32.14 kg/m??     Wt Readings from Last 10 Encounters:   02/07/22 (!) 107 kg (235 lb 14.3 oz)   01/25/22 (!) 107.5 kg (237 lb)   01/25/22 (!) 107.5 kg (237 lb)   11/03/21 (!) 108.2 kg (238 lb 8.6 oz)   09/09/21 (!) 110.8 kg (244 lb 4.3 oz)   07/30/21 (!) 111.1 kg (245 lb)   06/08/21 (!) 113.4 kg (250 lb)   03/14/21 (!) 111.2 kg (245 lb 1.6 oz)   03/09/21 (!) 109 kg (240 lb 3.2 oz)   02/06/21 (!) 103.4 kg (228 lb)     Ht Readings from Last 3 Encounters:   02/06/22 182.9 cm (6' 0.01)   01/25/22 182.9 cm (6')   11/03/21 182.9 cm (6' 0.01)     ==================================================================  Energy Intake (outpatient):  Diet: [name] endorses fluctuating appetite, which is typical for him during exacerbations. Drinking well with no GI concerns. Requested fruit cups and pretzels.    Allergies, Intolerances, Sensitivities, and/or Cultural/Religious Dietary Restrictions:  include banana  Sodium in diet: historically adequate  Calcium in diet:   historically adequate  Food Insecurity:   Food Insecurity: Geophysicist/field seismologist Present    Worried About Programme researcher, broadcasting/film/video in the Last Year: Never true    Ran Out of Food in the Last Year: Sometimes true   CFTR modulator and Diet: Prescribed Trikafta (elexacaftor/tezacaftor/ivacaftor).  PO Supplements: none  Patient resources for DME/formula:  -none  Appetite Stimulant: none  Enteral feeding tube: none  Physical activity: limited recently d/t back pain; not discussed today    GI/Malabsorption (outpatient):  Enzyme brand, (meals/snacks):  Creon 36,000 @ 8/meal and 4/snack  Enzyme administration details: correct pre-meal administration., good compliance at all meals and snacks  Enzyme dose per MEAL (units lipase/kg/meal) 2691  Enzyme dose per DAY (units lipase/kg/day) 13447  GI meds:  Nutritionally relevant medications reviewed. Miralax, colace, probiotic, prilosec  Stools: last documented BM yesterday  Fecal Fat Studies: insufficient  No results found  for: [id number]  Lab Results   Component Value Date    ELAST <15 (L) 03/19/2017     No results found for: PELAI    Vitamins/Minerals (outpatient):  CF-specific MVI, dose, compliance: MVW Complete Formulation Softgel regular 2 daily, good compliance  Other vitamins/minerals/herbals: none  Patient Resources for vitamins: [employer]  phone [phone]. Active through September 2023.  Calcium supplement: none  Fat-soluble vitamin levels:   Lab Results   Component Value Date    VITAMINA 40.8 11/15/2021    VITAMINA 58.7 02/06/2021    VITAMINA 42.4 01/18/2020     Lab Results   Component Value Date    CRP <4.0 02/06/2022    CRP 5.0 01/25/2022    CRP <4.0 11/13/2021     Lab Results   Component Value Date    VITDTOTAL 31.1 11/15/2021    VITDTOTAL 38.2 02/06/2021    VITDTOTAL 27.9 03/19/2017     Lab Results   Component Value Date    VITAME 10.0 11/15/2021    VITAME 16.0 02/06/2021     Lab Results   Component Value Date    PT 12.0 02/07/2022    PT 13.3 (H) 11/13/2021    PT 23.6 (H) 11/09/2021     Lab Results   Component Value Date    DESGCARBPT 0.2 11/09/2021    DESGCARBPT 0.2 03/20/2017     No results found for: PIVKAII    Bone Health: Normal vitamin D level. Adequate calcium intake from diet. Normal bone density on DEXA 02/2017. Due for repeat.    CF Related Diabetes: yes; on insulin with insulin pump. Followed by endocrinology. Rising HbA1c.  Lab Results   Component Value Date    GLUF 178 (H) 01/18/2020     No results found for: GLUCOSE2HR  Lab Results   Component Value Date    A1C 11.3 (H) 02/07/2022    A1C 10.6 (H) 11/03/2021    A1C 9.0 (H) 06/08/2021       Jackqulyn Livings MPH, RD, LDN  Pager: [phone]

## 2022-02-08 NOTE — Unmapped (Signed)
Hematology Consult Note     Requesting Attending Physician: Roberto Scales   Service Requesting Consult: [facility]   Primary Hematologist: None     Reason for Consult: Upper extremity DVT    Assessment and Plan:  Christian Young is a 36 y.o. patient with cystic fibrosis c/b recurrent bronchiectasis exacerbations, chronic rhinosinuitis, pancreatic insufficiency, recurrent catheter-associated DVT (2016), who was admitted to [facility] on 02/06/22 for bronchiectasis exacerbation. ENT is planning to perform revision sinus surgery while inpatient on 6/30. Hematology was consulted for anticoagulation management.     #Recurrent upper extremity DVT  Data:   - Symptoms: RUE pain.   - Imaging: Unavailable   - Risk factors: a) PICC line, b) Likely concurrent hospitalization for bronchiectasis exacerbation as was receiving antibiotics through PICC, c) BMI 31.99  - Thrombophilia workup: Not indicated   - Treatment history: Xarelto 20 mg daily (07/2015-)   - BMI 31.99. Cr 0.61.  Plan  Christian Young has a history of recurrent PICC-line associated DVT. Details are unfortunately unclear and primary records are unavailable; however the consensus between the patient and medical records dating back to January, 2017, is that his only clots were in the right arm and associated with PICC lines. A recent meta-analysis of studies comparing the risk of PICC vs port-associated VTE in patients with cancer found that the latter was associated with a lower risk of thrombosis (OR 0.38, 95% CI 0.25-0.58) Regino Schultze, Thrombosis Research, 2022). Given the available information, there is no clear indication for him to be on long term therapeutic anticoagulation and certainly no contraindication to interrupting therapeutic anticoagulation perioperatively. We would recommend DVT prophylaxis with enoxaparin 40 mg daily while hospitalized and an outpatient hematology referral to clarify medical records and his need to be on long term anticoagulation. Recommendations:  - DECREASE enoxaparin dose to prophylaxis (40 mg daily)   - DECREASE xarelto dose to 10 mg daily at discharge  - Follow-up with hematology outpatient     Case discussed with Dr. Andrey Campanile, attestation to follow. Recommendations discussed with the primary team. We will sign off.     Napoleon Form, PGY-4  Hematology Fellow    Hematology/Oncology Transfer Request: [id number]  Hem/Onc Outpatient call: [phone]  Coagulation/Benign Hematology: [id number]  Malignant Hematology: [id number]   BMT Fellow: [id number]  Oncology/Solid Tumor: [id number]    -----------------------------------------     SUBJECTIVE + PERTINENT EVENTS:    HPI  Christian Young is a 36 y.o. patient with cystic fibrosis c/b recurrent bronchiectasis exacerbations, chronic rhinosinuitis, pancreatic insufficiency, recurrent catheter-associated DVT (2016), who was admitted to [facility] on 02/06/22 for bronchiectasis exacerbation. ENT is planning to perform revision sinus surgery while inpatient on 6/30.      He is unable to remember details exactly but recalls a DVT in his right arm in 2016 associated with a PICC line. Closest record to the time of the event is a note by Dr. Barb Merino [facility]) on 09/06/2015 which says: He is taking xarelto due to 2 DVT in the RUE due to PICC line. Last DVT diagnosed in 07/2015. He denies a history of pulmonary embolism, lower extremity deep vein thrombosis, or taking testosterone supplementation. Multiple PVL on 11/20/16, 02/14/17 (lower extremity), 06/18/17 (LUE), or CTA chest (09/08/21) do not show evidence of VTE.     Past Medical History:   Diagnosis Date   ??? Anxiety    ??? Asthma    ??? Chronic pain disorder    ??? Cystic fibrosis (CMS-HCC)    ??? Depression    ???  Diabetes mellitus (CMS-HCC)    ??? Hypertension    ??? Lumbar radiculopathy 10/26/2020   ??? Nonproductive cough 04/05/2018       Family History   Problem Relation Age of Onset   ??? Bipolar disorder Mother    ??? Depression Mother      Social History Socioeconomic History   ??? Marital status: Married     Spouse name: None   ??? Number of children: None   ??? Years of education: None   ??? Highest education level: None   Tobacco Use   ??? Smoking status: Former     Types: e-Cigarettes   ??? Smokeless tobacco: Never   Vaping Use   ??? Vaping Use: Former   Substance and Sexual Activity   ??? Alcohol use: Not Currently     Alcohol/week: 3.0 standard drinks     Types: 3 Glasses of wine per week   ??? Drug use: No   ??? Sexual activity: Yes     Social Determinants of Health     Financial Resource Strain: Low Risk    ??? Difficulty of Paying Living Expenses: Not hard at all   Food Insecurity: Food Insecurity Present   ??? Worried About Programme researcher, broadcasting/film/video in the Last Year: Never true   ??? Ran Out of Food in the Last Year: Sometimes true   Transportation Needs: No Transportation Needs   ??? Lack of Transportation (Medical): No   ??? Lack of Transportation (Non-Medical): No     MEDICATIONS / ALLERGIES:    Current Facility-Administered Medications:   ???  acetaminophen (TYLENOL) tablet 500 mg, 500 mg, Oral, Q6H PRN, Pixie Casino, MD  ???  albuterol 2.5 mg /3 mL (0.083 %) nebulizer solution 2.5 mg, 2.5 mg, Nebulization, 4x Daily (RT), Pixie Casino, MD, 2.5 mg at 02/07/22 1637  ???  aluminum-magnesium hydroxide-simethicone (MAALOX MAX) 80-80-8 mg/mL oral suspension, 30 mL, Oral, Q4H PRN, Pixie Casino, MD  ???  amitriptyline (ELAVIL) tablet 100 mg, 100 mg, Oral, Nightly, Pixie Casino, MD, 100 mg at 02/06/22 2150  ???  atorvastatin (LIPITOR) tablet 40 mg, 40 mg, Oral, Nightly, Pixie Casino, MD, 40 mg at 02/06/22 2149  ???  colistimethate (COLYMYCIN) 150 mg in sodium chloride (NS) 4 mL inhalation, 150 mg, Inhalation, Q12H SCH, Pixie Casino, MD, 150 mg at 02/07/22 0845  ???  dextrose (D10W) 10% bolus 125 mL, 12.5 g, Intravenous, Q15 Min PRN, [employer], AGNP  ???  dornase alfa (PULMOZYME) 1 mg/mL solution 2.5 mg, 2.5 mg, Inhalation, Daily, Pixie Casino, MD, 2.5 mg at 02/07/22 1610  ???  elexacaftor-tezacaftor-ivacaft (TRIKAFTA) tablet 2 tablet **Patient Supplied**, 2 tablet, Oral, Daily, 2 tablet at 02/07/22 1037 **AND** elexacaftor-tezacaftor-ivacaft (TRIKAFTA) tablet 1 tablet **Patient Supplied**, 1 tablet, Oral, Q PM, Jeanene Erb, FNP, 1 tablet at 02/07/22 1732  ???  enoxaparin (LOVENOX) syringe 105 mg, 1 mg/kg, Subcutaneous, Q12H SCH, [address] [place], [place]  ???  FLUoxetine (PROzac) capsule 60 mg, 60 mg, Oral, Daily, Pixie Casino, MD, 60 mg at 02/07/22 1027  ???  fluticasone furoate-vilanteroL (BREO ELLIPTA) 200-25 mcg/dose inhaler 1 puff, 1 puff, Inhalation, Daily (RT), Pixie Casino, MD, 1 puff at 02/07/22 0826  ???  fluticasone propionate (FLONASE) 50 mcg/actuation nasal spray 2 spray, 2 spray, Each Nare, Daily, Pixie Casino, MD, 2 spray at 02/07/22 1037  ???  glucagon injection 1 mg, 1 mg, Intramuscular, Once PRN, Pixie Casino, MD  ???  glucose chewable tablet  16 g, 16 g, Oral, Q10 Min PRN, Pixie Casino, MD  ???  guaiFENesin (ROBITUSSIN) oral syrup, 200 mg, Oral, Q4H PRN, Pixie Casino, MD  ???  insulin glargine (LANTUS) injection 45 Units, 45 Units, Subcutaneous, Nightly, Pixie Casino, MD, 45 Units at 02/06/22 1947  ???  insulin lispro (HumaLOG) injection 0-20 Units, 0-20 Units, Subcutaneous, ACHS, Pixie Casino, MD, 6 Units at 02/07/22 1251  ???  lamoTRIgine (LaMICtal) tablet 400 mg, 400 mg, Oral, Daily, Pixie Casino, MD, 400 mg at 02/07/22 1027  ???  lipase-protease-amylase (CREON) 36,000-114,000- 180,000 unit capsule 288,000 units of lipase **Patient Supplied**, 8 capsule, Oral, 3xd Meals, Jeanene Erb, FNP, 288,000 units of lipase at 02/07/22 1732  ???  lisinopriL (PRINIVIL,ZESTRIL) tablet 10 mg, 10 mg, Oral, Daily, Pixie Casino, MD  ???  loratadine (CLARITIN) tablet 10 mg, 10 mg, Oral, Daily, Pixie Casino, MD, 10 mg at 02/07/22 1027  ???  montelukast (SINGULAIR) tablet 10 mg, 10 mg, Oral, Nightly, Pixie Casino, MD, 10 mg at 02/06/22 2150  ???  oxyCODONE (ROXICODONE) immediate release tablet 5 mg, 5 mg, Oral, Q4H PRN, Pixie Casino, MD, 5 mg at 02/07/22 1738  ???  pancrelipase (Lip-Prot-Amyl) (CREON) 12,000-38,000 -60,000 unit capsule, delayed release 48,000 units of lipase, 4 capsule, Oral, TID PRN, Pixie Casino, MD  ???  pantoprazole (PROTONIX) EC tablet 20 mg, 20 mg, Oral, Daily, Pixie Casino, MD, 20 mg at 02/07/22 1027  ???  pediatric multivitamin-vit D3 1,500 unit-vit K 800 mcg (MVW COMPLETE FORMULATION) capsule, 2 capsule, Oral, Daily, Pixie Casino, MD, 2 capsule at 02/07/22 1123  ???  piperacillin-tazobactam (ZOSYN) IVPB (premix) 4.5 g, 4.5 g, Intravenous, Q6H, Pixie Casino, MD, Stopped at 02/07/22 1300  ???  polyethylene glycol (MIRALAX) packet 17 g, 17 g, Oral, Daily, Pixie Casino, MD  ???  pramipexole (MIRAPEX) tablet 0.25 mg, 0.25 mg, Oral, Nightly, Pixie Casino, MD, 0.25 mg at 02/06/22 2220  ???  senna (SENOKOT) tablet 2 tablet, 2 tablet, Oral, Nightly PRN, Pixie Casino, MD  ???  sod chlor-bicarb-squeez bottle (NEILMED) nasal packet 1 packet, 1 packet, Each Nare, BID, Pixie Casino, MD, 1 packet at 02/07/22 1133  ???  sodium chloride 7% NEBULIZER solution 4 mL, 4 mL, Nebulization, 4x Daily (RT), Pixie Casino, MD, 4 mL at 02/07/22 1637  ???  traZODone (DESYREL) tablet 150 mg, 150 mg, Oral, Nightly, Pixie Casino, MD, 150 mg at 02/06/22 2150    Allergies   Allergen Reactions   ??? Aztreonam Anaphylaxis, Hives, Nausea And Vomiting and Rash     fevers  Patient stated that he only vomited x1 with [place] in the past.????    fevers  Patient stated that he only vomited x1 with [place] in the past.????   ??? [place] [Aztreonam Lysine] Anaphylaxis   ??? Cefepime Itching, Nausea Only and Other (See Comments)     Headaches also   ??? Other Anaphylaxis and Other (See Comments)     Other reaction(s): Other (See Comments)  Bananas: itchy throat  Slo Bid record from [employer] states anaphylaxis.????Pt states this was from childhood and does not know reaction.  Bananas, causes itchy throat   ??? Slo-Bid 100 Anaphylaxis   ??? Tobramycin Tinnitus     From OSH record-documented as tinnitus but has received IV tobra with close monitoring.  Pt has received Tobramycin at [facility] since this allergy documented    From OSH record-documented as tinnitus but has  received IV tobra with close monitoring.   ??? Banana Itching and Nausea And Vomiting     ROS: Pertinent positives are listed in HPI, all other systems reviewed are negative.     OBJECTIVE:  Vitals:   Vitals:    02/07/22 1524   BP: 104/67   Pulse:    Resp:    Temp:    SpO2:       Physical exam:  General: Well but tired appearing young man, laying in hospital bed in no acute distress  Eye: Anicteric sclerae, no conjunctival erythema  HEENT: No cervical lymphadenopathy.   Cardiac: Normal rate, regular rhythm.  Pulmonary: respirations unlabored on room air.   Abdomen: non-distended.  Extremities: No edema.   Skin: No rashes.   Neuro: Alert and conversant     LABS / IMAGING:    Results in Past 30 Days  Result Component Current Result Ref Range Previous Result Ref Range   Absolute Lymphocytes 0.3 (L) (02/07/2022) 1.1 - 3.6 10*9/L 1.7 (02/06/2022) 1.1 - 3.6 10*9/L   Absolute Neutrophils 5.4 (02/07/2022) 1.8 - 7.8 10*9/L 4.5 (02/06/2022) 1.8 - 7.8 10*9/L   Alkaline Phosphatase 116 (02/06/2022) 46 - 116 U/L 124 (H) (02/06/2022) 46 - 116 U/L   ALT 63 (H) (02/06/2022) 10 - 49 U/L 51 (H) (02/06/2022) 10 - 49 U/L   AST 42 (H) (02/06/2022) <=34 U/L 30 (02/06/2022) <=34 U/L   Creatinine 0.61 (02/07/2022) 0.60 - 1.10 mg/dL 1.61 (0/96/0454) 0.98 - 1.10 mg/dL   HGB 11.9 (1/47/8295) 12.9 - 16.5 g/dL 62.1 (10/18/6576) 46.9 - 16.5 g/dL   MCV 62.9 (01/08/4131) 77.6 - 95.7 fL 80.0 (02/06/2022) 77.6 - 95.7 fL   Platelet 207 (02/07/2022) 150 - 450 10*9/L 244 (02/06/2022) 150 - 450 10*9/L   PT 12.0 (02/07/2022) 9.8 - 12.8 sec Not in Time Range    RDW 15.5 (H) (02/07/2022) 12.2 - 15.2 % 15.9 (H) (02/06/2022) 12.2 - 15.2 %   Total Bilirubin 0.5 (02/06/2022) 0.3 - 1.2 mg/dL 0.4 (4/40/1027) 0.3 - 1.2 mg/dL   WBC 6.5 (2/53/6644) 3.6 - 11.2 10*9/L 6.9 (02/06/2022) 3.6 - 11.2 10*9/L       Imaging: Reviewed multiple doppler ultrasounds

## 2022-02-08 NOTE — Unmapped (Cosign Needed)
Endocrine Team New Consult Note     Requesting Attending Physician : Terri Piedra, [place]*  Service Requesting Consult : Med Bernita Raisin [facility])  Primary Care Provider: Hal Morales, NP    IMPRESSION:  Christian Young is a 36 y.o. male admitted for CF exacerbation and revision of sinus surgery. We have been consulted at the request of Terri Piedra, AG* to evaluate [name] for hyperglycemia.     MEDICAL DECISION MAKING   Diagnoses:  1.CFRD. Uncontrolled. With severe hyperglycemia last 24 hours.  2. Nutrition: Complicating glycemic control. Increasing risk for both hypoglycemia and hyperglycemia.  3. Infection. Complicating glycemic control and increasing risk for hyperglycemia.  4. Obesity. Complicating glycemic control and increasing risk for hyperglycemia.      Data reviewed 02/08/22:  ??? Diagnostic studies and interpretation:    o 02-20-2022   - A1c 11.3% -high  o BMP - no AKI, hyperglycemia   o POCT glucose - hyperglycemia     ??? Notes reviewed: Primary team, Pulmonology and nursing notes    Overall impression based on above reviews and history:  -NPO today     RECOMMENDATIONS:   - continue Lantus 45u at bedtime   -50% if NPO   -increase to lispro 20u TID AC   -strengthen to lispro correctional TG 140, ISF 30  - Hypoglycemia protocol  - POCT-BG achs.  - Ensure patient is on glucose precautions.     Discharge Planning:   -will message schedulers closer to discharge for follow-up with both [facility] Endocrinologist and CDE  -pt will likely need subcutaneous insulin (with insulin pens) at discharge as his current insulin requirements are too great for omnipod pods   -can consider alternative pump vs concentrated insulin for Omnipod once seen in Endocrine clinic     Thank you for this consult. Discussed plan with primary team. We will continue to follow and make recommendations and place orders as appropriate.    Please page with questions or concerns: Endocrine Fellow: [phone]  DCT on call from 6AM - 3PM on weekdays then endocrine fellow on call: [phone] from 3PM - 6AM on weekdays and on weekends and holidays.   If APP cannot be reached, please page the endocrine fellow on call.      Subjective:  Interval hx: Today 02/09/22, pt waiting in PACU holding area. No lows overnight.     Initial H&P:    Christian Young is a 36 y.o. male with PMH significant for  cystic fibrosis, essential hypertension, depressive disorder, diabetes related to cystic fibrosis, cystic fibrosis related sinus disease, pancreatic insufficiency due to cystic fibrosis, and history of DVT  who is admitted for CF exacerbation.     Pt states that he usually uses an omnipod, however, due to his insulin requirements, the pods are only lasting a day and a half. He reports that his settings have not changed since he was last seen in clinic last summer.     Diabetes History:  Patient has a history of Cystic fibrosis related diabetes diagnosed ~2017.  Diabetes is managed by: Dr. Lenon Curt [facility] Endocrine) - last appt July 2022.  Current home diabetes regimen: Omnipod with Dexcom (settings below).      Basal rates- 12am-12am 1.5 units/hr   TBD =36 units  Insulin to carbohydrate ratios:  1: 4.5 g carbs  Insulin sensitivity factor:  1: 25 mg/dl   Active insulin duration = 4 hours  BG targets: 130 mg/dL  Correct above 956 mg/dl  Current home blood glucose monitoring: dexcom.  Hypoglycemia awareness: yes.  Complications related to diabetes: none known    Current Nutrition:  Active Orders   Diet    NPO Sips with meds; Operating room    Nutrition Therapy Regular/House         ROS: As per HPI.    ??? albuterol  2.5 mg Nebulization 4x Daily (RT)   ??? amitriptyline  100 mg Oral Nightly   ??? atorvastatin  40 mg Oral Nightly   ??? colistimetate (COLISTIN) inhalation  150 mg Inhalation Q12H [facility]   ??? dornase alfa  2.5 mg Inhalation Daily   ??? elexacaftor-tezacaftor-ivacaft  2 tablet Oral Daily    And   ??? elexacaftor-tezacaftor-ivacaft  1 tablet Oral Q PM   ??? enoxaparin (LOVENOX) injection  40 mg Subcutaneous Q12H [facility]   ??? FLUoxetine  60 mg Oral Daily   ??? fluticasone furoate-vilanteroL  1 puff Inhalation Daily (RT)   ??? fluticasone propionate  2 spray Each Nare Daily   ??? insulin glargine  22 Units Subcutaneous Nightly   ??? insulin lispro  0-20 Units Subcutaneous ACHS   ??? lamoTRIgine  400 mg Oral Daily   ??? lipase-protease-amylase  8 capsule Oral 3xd Meals   ??? lisinopriL  10 mg Oral Daily   ??? loratadine  10 mg Oral Daily   ??? montelukast  10 mg Oral Nightly   ??? pantoprazole  20 mg Oral Daily   ??? MVW Complete (pediatric multivit 61-D3-vit K)  2 capsule Oral Daily   ??? piperacillin-tazobactam (ZOSYN) IV (intermittent)  4.5 g Intravenous Q6H   ??? polyethylene glycol  17 g Oral Daily   ??? pramipexole  0.25 mg Oral Nightly   ??? sod chlor-bicarb-squeez bottle  1 packet Each Nare BID   ??? sodium chloride 7%  4 mL Nebulization 4x Daily (RT)   ??? traZODone  150 mg Oral Nightly       Current Outpatient Medications   Medication Instructions   ??? acetaminophen (TYLENOL) 500 mg, Oral, Every 6 hours PRN   ??? albuterol (PROVENTIL HFA;VENTOLIN HFA) 90 mcg/actuation inhaler 2 puffs, Inhalation, Every 6 hours PRN   ??? albuterol 2.5 mg, Nebulization, 2 times a day   ??? alcohol swabs (ALCOHOL PREP PADS) PadM Use as directed with inhaled antibiotics   ??? amitriptyline (ELAVIL) 100 MG tablet TAKE ONE TABLET BY MOUTH AT BEDTIME   ??? atorvastatin (LIPITOR) 40 mg, Oral, Nightly   ??? blood sugar diagnostic (GLUCOSE BLOOD) Strp Other, 3 times a day (with meals), Rx sent to [facility] 01/04/20    ??? blood-glucose meter kit Dispense meter that is preferred by patient's insurance company   ??? blood-glucose meter,continuous (DEXCOM G6 RECEIVER) Misc Use as directed   ??? blood-glucose sensor (DEXCOM G6 SENSOR) Devi Use sq as directed every 10 days   ??? blood-glucose transmitter (DEXCOM G6 TRANSMITTER) Devi Use as directed, replace every 90 days   ??? budesonide-formoteroL (SYMBICORT) 160-4.5 mcg/actuation inhaler 2 puffs, Inhalation, 2 times a day (standard)   ??? colistimethate (COLYMYCIN) 150 mg injection Inhale 2 mL (150 mg total) Two (2) times a day, alternating 28 days on and 28 days off. Inject 2mL SWFI into colistin vial & gently mix, then withdraw 2mL (150mg ) for dose.   ??? colistimethate (COLYMYCIN) 150 mg injection Inhale 2 mL (150 mg total) Two (2) times a day, alternating 28 days on and 28 days off. Inject 2mL SWFI into colistin vial & gently mix, then withdraw 2mL (150mg ) for  dose.   ??? EASY TOUCH LANCING DEVICE Misc Use as directed.   ??? EASY TOUCH TWIST LANCETS 30 gauge Misc Use as directed.   ??? elexacaftor-tezacaftor-ivacaft (TRIKAFTA) 100-50-75 mg(d) /150 mg (n) tablet Take 2 Tablets (orange) by mouth in the morning and 1 tablet (blue) in the evening with fatty food   ??? empty container Misc Use as directed to dispose of syringes   ??? FLUoxetine 60 mg, Oral, Daily (standard)   ??? fluticasone propionate (FLONASE) 50 mcg/actuation nasal spray 2 sprays, Each Nare, Daily (standard)   ??? FREESTYLE LIBRE 14 DAY SENSOR kit Use 3-4 times daily   ??? insulin ASPART (NOVOLOG U-100 INSULIN ASPART) 100 unit/mL injection Subcutaneously infuse up to 150 units daily via insulin pump   ??? insulin pump cart,automated,BT (OMNIPOD 5 G6 PODS, GEN 5,) Crtg 1 each, subcutaneous (via wearable injector), Every 3 days, Change every 72 hour   ??? lamoTRIgine (LAMICTAL) 400 mg, Oral, Daily (standard)   ??? levocetirizine (XYZAL) 5 mg, Oral, Every evening   ??? levoFLOXacin (LEVAQUIN) 750 mg, Oral, Daily (standard)   ??? lipase-protease-amylase (CREON) 36,000-114,000- 180,000 unit CpDR Take 8 caps by mouth with meals three times daily and 4 caps with snacks up to three times a day.   ??? lisinopriL (PRINIVIL,ZESTRIL) 10 mg, Oral, Daily (standard)   ??? montelukast (SINGULAIR) 10 mg, Oral, Nightly   ??? MVW COMPLETE FORMUL PROBIOTIC 40 billion cell -15 mg CpDR 1 capsule, Oral, Daily (standard)   ??? MVW Complete, pediatric multivit 61-D3-vit K, (MVW COMPLETE FORMULATION) 1,500-800 unit-mcg cap 2 capsules, Oral, 2 times a day (standard)   ??? nebulizers (LC PLUS) Misc use as directed with nebulized medications twice daily.   ??? nebulizers (PARI LC D NEBULIZER) Misc Use with inhaled medication.   ??? nebulizers Misc Use as directed with inhaled medications   ??? omeprazole (PRILOSEC) 20 mg, Oral, Daily (standard)   ??? pen needle, diabetic (BD ULTRA-FINE NANO PEN NEEDLE) 32 gauge x 5/32 (4 mm) Ndle use up to 4 times daily   ??? pramipexole (MIRAPEX) 0.25 mg, Oral, Nightly   ??? PULMOZYME 2.5 mg, Inhalation, Daily (standard)   ??? sodium chloride 7% 7 % Nebu 4 mL, Nebulization, 2 times a day   ??? sterile water Soln Use 2 mL to mix colistin, then add another 1 mL SWFI into neb cup along with mixed colistin.   ??? syringe with needle (BD LUER-LOK SYRINGE) 3 mL 21 gauge x 1 Syrg For use with inhaled antibiotic   ??? traZODone (DESYREL) 150 mg, Oral, Nightly   ??? XARELTO 20 mg, Oral, Daily (standard)           Past Medical History:   Diagnosis Date   ??? Anxiety    ??? Asthma    ??? Chronic pain disorder    ??? Cystic fibrosis (CMS-HCC)    ??? Depression    ??? Diabetes mellitus (CMS-HCC)    ??? Hypertension    ??? Lumbar radiculopathy 10/26/2020   ??? Nonproductive cough 04/05/2018       Past Surgical History:   Procedure Laterality Date   ??? IR INSERT PORT AGE GREATER THAN 5 YRS  03/27/2019    IR INSERT PORT AGE GREATER THAN 5 YRS 03/27/2019 Rush Barer, MD IMG [facility]IR [facility]   ??? PR REMOVAL OF LUNG,LOBECTOMY Right 03/29/2017    Procedure: REMOVAL OF LUNG, OTHER THAN PNEUMONECTOMY; SINGLE LOBE (LOBECTOMY);  Surgeon: Cherie Dark, MD;  Location: MAIN OR [facility];  Service: Thoracic   ???  SINUS SURGERY         Family History   Problem Relation Age of Onset   ??? Bipolar disorder Mother    ??? Depression Mother        Social History     Tobacco Use   ??? Smoking status: Former     Types: e-Cigarettes   ??? Smokeless tobacco: Never   Vaping Use   ??? Vaping Use: Former   Substance Use Topics   ??? Alcohol use: Not Currently     Alcohol/week: 3.0 standard drinks     Types: 3 Glasses of wine per week   ??? Drug use: No       OBJECTIVE:  BP 123/88  - Pulse 83  - Temp 36.7 ??C (98.1 ??F) (Oral)  - Resp 16  - Ht 182.9 cm (6' 0.01)  - Wt (!) 107 kg (235 lb 14.3 oz)  - SpO2 97%  - BMI 31.99 kg/m??   Wt Readings from Last 12 Encounters:   02/07/22 (!) 107 kg (235 lb 14.3 oz)   01/25/22 (!) 107.5 kg (237 lb)   01/25/22 (!) 107.5 kg (237 lb)   11/03/21 (!) 108.2 kg (238 lb 8.6 oz)   09/09/21 (!) 110.8 kg (244 lb 4.3 oz)   07/30/21 (!) 111.1 kg (245 lb)   06/08/21 (!) 113.4 kg (250 lb)   03/14/21 (!) 111.2 kg (245 lb 1.6 oz)   03/09/21 (!) 109 kg (240 lb 3.2 oz)   02/06/21 (!) 103.4 kg (228 lb)   11/24/20 (!) 101.3 kg (223 lb 4.8 oz)   06/28/20 (!) 105.8 kg (233 lb 4.8 oz)       General: Well-appearing. No acute distress. Ver pleasant. Body mass index is 31.99 kg/m??.   HEENT: Normocephalic, atraumatic. EOMI  Neck: No stridor.    Respiratory: NO conversational dyspnea  Abdominal: Non-distended.    Neuro: Awake, alert, and oriented

## 2022-02-08 NOTE — Unmapped (Cosign Needed)
Pulmonary Consult Service  Follow-Up Note      Primary Service:   Hospitalist- [place]  Primary Service Attending:  Terri Piedra, [place]*  Reason for Consult:   Cystic fibrosis    IMPRESSION and RECOMMENDATIONS     Christian Young is a 35 y.o. male with a history of cystic fibrosis (S945L and [phone] insertion) on elexacaftor/tezacaftor/ivacaftor with pulmonary, sinus, pancreatic, and gastrointestinal manifestations who presents with a cystic fibrosis flare and sinus infection.  Pulmonary function test obtained today support this, with a drop in his FEV1 of over 10%.  We recommend treating this with antibiotics and airway clearance.    Given his sinus symptoms, inpatient sinus surgery is reasonable as well.  While we would prefer a slightly longer course of antibiotics prior to surgery, if surgery is logistically feasible only this week, it is reasonable to pursue at this time.  Aside from antibiotics and airway clearance as detailed below, there are no other means by which we can further optimize the patient from a pulmonary standpoint prior to surgery.    #Cystic fibrosis exacerbation  - Continue IV piperacillin/tazobactam (06/15- ); duration to be determined by clinical course and response to the surgery  - Continue inhaled colistin (06/15- ) for planned 28-day course  - Continue airway clearance with 7% hypertonic saline and albuterol pretreatment 4 times daily; continue mechanical airway clearance with chest vest or any device at patient's preference  -Trend CRP and ESR weekly  -We will discuss timing of repeat pulmonary function testing with ENT after surgery    #Cystic fibrosis-related sinus disease  - Agree with ENT consult to determine timing of sinus surgery; as above, no absolute contraindication to surgery at this time  - Agree with hematology consult for perioperative anticoagulation management    #Cystic fibrosis-related pancreatic insufficiency diabetes  - Agree with endocrine consult    #Cystic fibrosis-related constipation  - Agree with bowel regimen      Christian Young was seen, examined and discussed with  Dr. Kerrie Buffalo  Thank you for involving [name] in his care. We look forward to following with you.  Please don't hesitate to page [name] with any questions or concerns at [phone] (pulmonary consult fellow).    Hilda Blades, MD  [facility] Pulmonary & Critical Care Fellow, PGY-4  02/08/2022      SUBJECTIVE:     Patient feels well; states that he has turned a corner.     Medications:     Scheduled Meds:   albuterol  2.5 mg Nebulization 4x Daily (RT)    amitriptyline  100 mg Oral Nightly    atorvastatin  40 mg Oral Nightly    colistimetate (COLISTIN) inhalation  150 mg Inhalation Q12H SCH    dornase alfa  2.5 mg Inhalation Daily    elexacaftor-tezacaftor-ivacaft  2 tablet Oral Daily    And    elexacaftor-tezacaftor-ivacaft  1 tablet Oral Q PM    enoxaparin (LOVENOX) injection  40 mg Subcutaneous Q12H SCH    FLUoxetine  60 mg Oral Daily    fluticasone furoate-vilanteroL  1 puff Inhalation Daily (RT)    fluticasone propionate  2 spray Each Nare Daily    insulin glargine  45 Units Subcutaneous Nightly    insulin lispro  0-20 Units Subcutaneous ACHS    lamoTRIgine  400 mg Oral Daily    lipase-protease-amylase  8 capsule Oral 3xd Meals    lisinopriL  10 mg Oral Daily    loratadine  10 mg Oral  Daily    montelukast  10 mg Oral Nightly    pantoprazole  20 mg Oral Daily    MVW Complete (pediatric multivit 61-D3-vit K)  2 capsule Oral Daily    piperacillin-tazobactam (ZOSYN) IV (intermittent)  4.5 g Intravenous Q6H    polyethylene glycol  17 g Oral Daily    pramipexole  0.25 mg Oral Nightly    sod chlor-bicarb-squeez bottle  1 packet Each Nare BID    sodium chloride 7%  4 mL Nebulization 4x Daily (RT)    traZODone  150 mg Oral Nightly     Continuous Infusions:  PRN Meds:.acetaminophen, aluminum-magnesium hydroxide-simethicone, dextrose, glucagon, glucose, guaiFENesin, oxyCODONE, pancrelipase (Lip-Prot-Amyl), senna     PHYSICAL EXAM:   BP 123/88  - Pulse 83  - Temp 36.7 ??C (98.1 ??F) (Oral)  - Resp 16  - Ht 182.9 cm (6' 0.01)  - Wt (!) 107 kg (235 lb 14.3 oz)  - SpO2 97%  - BMI 31.99 kg/m??   General: Alert and oriented, no acute distress  HEENT: MMM, clear oropharynx  CV: RRR, no m/r/g  Lungs: CTAB, no increased work of breathing  Abd: Soft, NT, ND, no rebound or guarding  Ext: Warm, well perfused, no peripheral edema  Skin: No rashes  Neuro: No focal deficits    LABORATORY and RADIOLOGY DATA:     Pertinent Laboratory Data from the last 24 hours:  Lab Results   Component Value Date    WBC 6.5 02/07/2022    HGB 13.8 02/07/2022    HCT 40.2 02/07/2022    PLT 207 02/07/2022     Lab Results   Component Value Date    NA 135 02/08/2022    NA 135 02/08/2022    K 3.6 02/08/2022    K 3.6 02/08/2022    CL 101 02/08/2022    CL 101 02/08/2022    CO2 28.0 02/08/2022    CO2 28.0 02/08/2022    BUN 12 02/08/2022    BUN 12 02/08/2022    CREATININE 0.74 02/08/2022    CREATININE 0.74 02/08/2022    GLU 335 (H) 02/08/2022    GLU 335 (H) 02/08/2022    CALCIUM 7.9 (L) 02/08/2022    CALCIUM 7.9 (L) 02/08/2022    MG 1.3 (L) 02/07/2022    PHOS 3.9 02/06/2022       Lab Results   Component Value Date    BILITOT 0.3 02/08/2022    BILIDIR 0.20 02/06/2022    PROT 6.3 02/08/2022    ALBUMIN 3.5 02/08/2022    ALT 55 (H) 02/08/2022    AST 34 02/08/2022    ALKPHOS 95 02/08/2022       Lab Results   Component Value Date    LABPROT 11.8 01/18/2020    INR 1.06 02/07/2022    APTT 31.6 11/03/2021       Pertinent Micro Data:  Blood Culture, Routine (no units)   Date Value   02/06/2022 No Growth at 24 hours   02/06/2022 No Growth at 24 hours     WBC (10*9/L)   Date Value   02/07/2022 6.5          Pertinent Imaging Data from the last 24 hours:  Reviewed in [facility]

## 2022-02-08 NOTE — Unmapped (Addendum)
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 *           Christian Young is a 36 y.o. male with past medical history significant for cystic fibrosis, essential hypertension, depressive disorder, diabetes related to cystic fibrosis, cystic fibrosis related sinus disease, pancreatic insufficiency due to cystic fibrosis, and history of DVT who presented to [facility] with Cystic fibrosis exacerbation (CMS-HCC).    Cystic fibrosis exacerbation: Patient presented to the ED with worsening fatigue, dyspnea and increased sputum production in the setting of recent rhinovirus for which he had been started on a course of PO Levaquin and 1 month of inhaled colistin. On admission,  RVP negative, CF sputum culture negative.  Chest x-ray with streaky right lower lung opacity, likely atelectasis.  Pulmonology consulted.  -Continue IV Zosyn, anticipate a 2-week course with final duration based on inflammatory markers and clinical response (6/27-7/10)  -Continue inhaled colistin x28 days (6/15-7/12)  -albuterol nebs, 7% hypertonic saline, chest vest 4x daily for airway clearance  -Continue home Trikafta, Pulmozyme, Breo Ellipta  -Follow-up PFTs ordered 6/28  -Weekly CRP/ESR, CBC w/ diff, CMP  - Follow pulmonology recs    CF related sinus disease: Repeat CT sinus this admission showed near complete opacification of the bilateral maxillary sinuses, although the ostiomeatal units are patent bilaterally. ENT plans to take patient to the OR for revision sinus surgery tomorrow, 6/30  - NPO after MN for OR 6/30  - will need at least 5 days of antibiotics post-operatively  - see below for heme recs for anticoagulation  - ENT consulted, follow up recommendations  -Continue home Flonase, Claritin, Singulair  -pulm recs continued BID isotonic sinus irrigation, no comment on this in ENT's note; will hold for now an clarify with ENT.     CF related diabetes, poorly-controlled: A1c 11.3 on admission, up from 10.6 in March.  Uses an insulin pump at home but didn't bring it with him.  During last hospitalization received Lantus 45 units nightly and lispro 20 units TID AC.  -Continue previous regimen of Lantus 45 U at bedtime, lispro 20 U TID AC, SSI  (1/2 Lantus dose tonight while NPO)  - have consulted endo for assistance with  BG control; recs pending    History of DVT:  Hematology consulted given upcoming surgery in setting of unclear clotting history. They recommend transitioning to prophylactic anticoagulation with outpatient follow up for long-term anticoagulation planning.   - prophylactic enoxaparin; hold day of surgery and for 5 days after surgery  - when resuming anticoagulation will do so at prophylactic dosing with rivoroxiban 10 mg daily (was previously on 20 mg daily)  -Outpatient hematology follow-up    CF related pancreatic insufficiency:  -Continue home Creon    Mood disorder:   -Continue home fluoxetine, amitriptyline, lamotrigine, trazodone    Hypertension:   -Continue home lisinopril    GERD:  -Continue formulary equivalent of home PPI    Restless leg syndrome:   -Continue home Mirapex    Chronic back pain:  Patient reports he is no longer taking gabapentin    Elevated liver enzymes (resolved): Has had mild elevations in the past which may be related to fatty liver disease. LFTs have normalized.     Dispo: It uncertain if patient may be a candidate for home IV antibiotics; will need to reassess later in his stay after OR.  Please see the note from Dr. [name], primary pulmonologist, dated 6/27 for details of care coordination prior to consideration of home infusion.    I personally spent 90 minutes face-to-face and non-face-to-face in the care of this patient, which includes all pre, intra, and post visit time on the date of service.  All documented time was specific to the E/M visit and does not include any procedures that may have been performed.     ___________________________________________________________________    Subjective:  Lying in bed, feeling improved from yesterday.  Aware of plan for OR tomorrow.  Discussed rising hemoglobin A1c    Labs/Studies:  Labs and Studies from the last 24hrs per EMR and Reviewed    Objective:  Temp:  [36.5 ??C (97.7 ??F)-36.9 ??C (98.4 ??F)] 36.7 ??C (98.1 ??F)  Heart Rate:  [83-103] 83  Resp:  [16-18] 16  BP: (92-123)/(61-88) 123/88  SpO2:  [94 %-97 %] 97 %    GEN: Lying in bed,  NAD  HEENT: sclera anicteric, moist mucous membranes  CV: RRR, no murmur  RESP: CTAB, normal WOB  ABD: soft, non-tender, BS normoactive, non-distended  EXT: no LE edema  NEURO: non-focal

## 2022-02-09 MED ADMIN — dexamethasone (DECADRON) 4 mg/mL injection: INTRAVENOUS | @ 19:00:00 | Stop: 2022-02-09

## 2022-02-09 MED ADMIN — fentaNYL (PF) (SUBLIMAZE) injection 25 mcg: 25 ug | INTRAVENOUS | @ 23:00:00 | Stop: 2022-02-09

## 2022-02-09 MED ADMIN — insulin lispro (HumaLOG) injection 0-20 Units: 0-20 [IU] | SUBCUTANEOUS | @ 17:00:00

## 2022-02-09 MED ADMIN — fentaNYL (PF) (SUBLIMAZE) injection 25 mcg: 25 ug | INTRAVENOUS | @ 22:00:00 | Stop: 2022-02-09

## 2022-02-09 MED ADMIN — lisinopriL (PRINIVIL,ZESTRIL) tablet 10 mg: 10 mg | ORAL | @ 13:00:00

## 2022-02-09 MED ADMIN — fentaNYL (PF) (SUBLIMAZE) injection: INTRAVENOUS | @ 20:00:00 | Stop: 2022-02-09

## 2022-02-09 MED ADMIN — VECuronium (NORCURON) injection: INTRAVENOUS | @ 21:00:00 | Stop: 2022-02-09

## 2022-02-09 MED ADMIN — lactated Ringers infusion: INTRAVENOUS | @ 21:00:00 | Stop: 2022-02-09

## 2022-02-09 MED ADMIN — loratadine (CLARITIN) tablet 10 mg: 10 mg | ORAL | @ 13:00:00

## 2022-02-09 MED ADMIN — albuterol 2.5 mg /3 mL (0.083 %) nebulizer solution 2.5 mg: 2.5 mg | RESPIRATORY_TRACT | @ 17:00:00

## 2022-02-09 MED ADMIN — lidocaine (XYLOCAINE) 20 mg/mL (2 %) injection: INTRAVENOUS | @ 19:00:00 | Stop: 2022-02-09

## 2022-02-09 MED ADMIN — sugammadex (BRIDION) injection: INTRAVENOUS | @ 22:00:00 | Stop: 2022-02-09

## 2022-02-09 MED ADMIN — FLUoxetine (PROzac) capsule 60 mg: 60 mg | ORAL | @ 13:00:00

## 2022-02-09 MED ADMIN — fluticasone furoate-vilanteroL (BREO ELLIPTA) 200-25 mcg/dose inhaler 1 puff: 1 | RESPIRATORY_TRACT | @ 13:00:00

## 2022-02-09 MED ADMIN — atorvastatin (LIPITOR) tablet 40 mg: 40 mg | ORAL

## 2022-02-09 MED ADMIN — ceFAZolin in 50ml iso-osmotic dextrose (ANCEF) 2 gram/50 mL IVPB: INTRAVENOUS | @ 19:00:00 | Stop: 2022-02-09

## 2022-02-09 MED ADMIN — oxyCODONE (ROXICODONE) immediate release tablet 5 mg: 5 mg | ORAL | @ 22:00:00 | Stop: 2022-02-09

## 2022-02-09 MED ADMIN — VECuronium (NORCURON) injection: INTRAVENOUS | @ 20:00:00 | Stop: 2022-02-09

## 2022-02-09 MED ADMIN — lactated Ringers infusion: INTRAVENOUS | @ 18:00:00 | Stop: 2022-02-09

## 2022-02-09 MED ADMIN — ondansetron (ZOFRAN) injection: INTRAVENOUS | @ 21:00:00 | Stop: 2022-02-09

## 2022-02-09 MED ADMIN — ePHEDrine (PF) 25 mg/5 mL (5 mg/mL) in 0.9% sodium chloride syringe Syrg: INTRAVENOUS | @ 21:00:00 | Stop: 2022-02-09

## 2022-02-09 MED ADMIN — fentaNYL (PF) (SUBLIMAZE) injection: INTRAVENOUS | @ 19:00:00 | Stop: 2022-02-09

## 2022-02-09 MED ADMIN — fluticasone propionate (FLONASE) 50 mcg/actuation nasal spray 2 spray: 2 | NASAL | @ 13:00:00

## 2022-02-09 MED ADMIN — sod chlor-bicarb-squeez bottle (NEILMED) nasal packet 1 packet: 1 | NASAL | @ 01:00:00 | Stop: 2022-02-13

## 2022-02-09 MED ADMIN — succinylcholine (ANECTINE) injection: INTRAVENOUS | @ 19:00:00 | Stop: 2022-02-09

## 2022-02-09 MED ADMIN — traZODone (DESYREL) tablet 150 mg: 150 mg | ORAL | @ 01:00:00

## 2022-02-09 MED ADMIN — propofoL (DIPRIVAN) injection: INTRAVENOUS | @ 19:00:00 | Stop: 2022-02-09

## 2022-02-09 MED ADMIN — phenylephrine (NEO-SYNEPHRINE) injection: INTRAVENOUS | @ 21:00:00 | Stop: 2022-02-09

## 2022-02-09 MED ADMIN — montelukast (SINGULAIR) tablet 10 mg: 10 mg | ORAL | @ 01:00:00

## 2022-02-09 MED ADMIN — dornase alfa (PULMOZYME) 1 mg/mL solution 2.5 mg: 2.5 mg | RESPIRATORY_TRACT | @ 13:00:00

## 2022-02-09 MED ADMIN — insulin glargine (LANTUS) injection 22 Units: 22 [IU] | SUBCUTANEOUS | @ 01:00:00

## 2022-02-09 MED ADMIN — EPINEPHrine (ADRENALIN) injection: TOPICAL | @ 19:00:00 | Stop: 2022-02-09

## 2022-02-09 MED ADMIN — albuterol 2.5 mg /3 mL (0.083 %) nebulizer solution 2.5 mg: 2.5 mg | RESPIRATORY_TRACT | @ 13:00:00

## 2022-02-09 MED ADMIN — piperacillin-tazobactam (ZOSYN) IVPB (premix) 4.5 g: 4.5 g | INTRAVENOUS | @ 17:00:00 | Stop: 2022-02-20

## 2022-02-09 MED ADMIN — matrix hemostatic sealant (FLOSEAL) topical: TOPICAL | @ 21:00:00 | Stop: 2022-02-09

## 2022-02-09 MED ADMIN — piperacillin-tazobactam (ZOSYN) IVPB (premix) 4.5 g: 4.5 g | INTRAVENOUS | @ 10:00:00 | Stop: 2022-02-20

## 2022-02-09 MED ADMIN — pediatric multivitamin-vit D3 1,500 unit-vit K 800 mcg (MVW COMPLETE FORMULATION) capsule: 2 | ORAL | @ 13:00:00

## 2022-02-09 MED ADMIN — pramipexole (MIRAPEX) tablet 0.25 mg: .25 mg | ORAL | @ 01:00:00

## 2022-02-09 MED ADMIN — amitriptyline (ELAVIL) tablet 100 mg: 100 mg | ORAL

## 2022-02-09 MED ADMIN — acetaminophen (TYLENOL) tablet 1,000 mg: 1000 mg | ORAL | @ 22:00:00 | Stop: 2022-02-09

## 2022-02-09 MED ADMIN — lamoTRIgine (LaMICtal) tablet 400 mg: 400 mg | ORAL | @ 13:00:00

## 2022-02-09 MED ADMIN — VECuronium (NORCURON) injection: INTRAVENOUS | @ 19:00:00 | Stop: 2022-02-09

## 2022-02-09 MED ADMIN — insulin lispro (HumaLOG) injection 0-20 Units: 0-20 [IU] | SUBCUTANEOUS | @ 02:00:00

## 2022-02-09 MED ADMIN — balanced salt irrigation solution (BSS) ophthalmic irrigation solution: OPHTHALMIC | @ 22:00:00 | Stop: 2022-02-09

## 2022-02-09 MED ADMIN — insulin lispro (HumaLOG) injection 0-20 Units: 0-20 [IU] | SUBCUTANEOUS | @ 13:00:00

## 2022-02-09 MED ADMIN — sodium chloride 7% NEBULIZER solution 4 mL: 4 mL | RESPIRATORY_TRACT | @ 13:00:00

## 2022-02-09 MED ADMIN — sodium chloride irrigation (NS) 0.9 % irrigation solution: @ 19:00:00 | Stop: 2022-02-09

## 2022-02-09 MED ADMIN — BUPivacaine-EPINEPHrine (PF) (MARCAINE-PF w/EPI) 0.25 %-1:200,000 30 mL: @ 19:00:00 | Stop: 2022-02-09

## 2022-02-09 MED ADMIN — piperacillin-tazobactam (ZOSYN) IVPB (premix) 4.5 g: 4.5 g | INTRAVENOUS | @ 04:00:00 | Stop: 2022-02-20

## 2022-02-09 MED ADMIN — oxyCODONE (ROXICODONE) immediate release tablet 5 mg: 5 mg | ORAL | @ 01:00:00 | Stop: 2022-02-20

## 2022-02-09 MED ADMIN — pantoprazole (PROTONIX) EC tablet 20 mg: 20 mg | ORAL | @ 13:00:00

## 2022-02-09 MED ADMIN — midazolam (VERSED) injection: INTRAVENOUS | @ 19:00:00 | Stop: 2022-02-09

## 2022-02-09 MED ADMIN — colistimethate (COLYMYCIN) 150 mg in sodium chloride (NS) 4 mL inhalation: 150 mg | RESPIRATORY_TRACT | @ 13:00:00

## 2022-02-09 MED ADMIN — sod chlor-bicarb-squeez bottle (NEILMED) nasal packet 1 packet: 1 | NASAL | @ 13:00:00 | Stop: 2022-02-13

## 2022-02-09 MED ADMIN — sodium chloride 7% NEBULIZER solution 4 mL: 4 mL | RESPIRATORY_TRACT | @ 17:00:00

## 2022-02-09 NOTE — Unmapped (Signed)
Pulmonary Consult Service  Follow-Up Note      Primary Service:   Hospitalist- [place]  Primary Service Attending:  Jeanene Erb, FNP  Reason for Consult:   Cystic fibrosis    IMPRESSION and RECOMMENDATIONS     Christian Young is a 36 y.o. male with a history of cystic fibrosis (S945L and [phone] insertion) on elexacaftor/tezacaftor/ivacaftor with pulmonary, sinus, pancreatic, and gastrointestinal manifestations who presents with a cystic fibrosis flare and sinus infection.  Pulmonary function test obtained today support this, with a drop in his FEV1 of over 10%.  We recommend treating this with antibiotics and airway clearance. He is planned to undergo sinus surgery today.    #Cystic fibrosis exacerbation  - Continue IV piperacillin/tazobactam (06/15- ); duration to be determined by clinical course and response to the surgery  - Continue inhaled colistin (06/15- ) for planned 28-day course  - Continue airway clearance with 7% hypertonic saline and albuterol pretreatment 4 times daily; continue mechanical airway clearance with chest vest or any device at patient's preference  -Trend CRP and ESR weekly  -We will discuss timing of repeat pulmonary function testing with ENT after surgery    #Cystic fibrosis-related sinus disease  - Agree with ENT consult to determine timing of sinus surgery; as above, no absolute contraindication to surgery at this time  - Agree with hematology consult for perioperative anticoagulation management    #Cystic fibrosis-related pancreatic insufficiency diabetes  - Agree with endocrine consult    #Cystic fibrosis-related constipation  - Agree with bowel regimen      Christian Young was seen, examined and discussed with  Dr. Kerrie Buffalo  Thank you for involving [name] in his care. We look forward to following with you.  Please don't hesitate to page [name] with any questions or concerns at [phone] (pulmonary consult fellow).    Hilda Blades, MD  [facility] Pulmonary & Critical Care Fellow, PGY-4  02/09/2022      SUBJECTIVE:     Patient feels well, denies complaints     Medications:     Scheduled Meds:   albuterol  2.5 mg Nebulization 4x Daily (RT)    amitriptyline  100 mg Oral Nightly    atorvastatin  40 mg Oral Nightly    colistimetate (COLISTIN) inhalation  150 mg Inhalation Q12H SCH    dornase alfa  2.5 mg Inhalation Daily    elexacaftor-tezacaftor-ivacaft  2 tablet Oral Daily    And    elexacaftor-tezacaftor-ivacaft  1 tablet Oral Q PM    FLUoxetine  60 mg Oral Daily    fluticasone furoate-vilanteroL  1 puff Inhalation Daily (RT)    fluticasone propionate  2 spray Each Nare Daily    insulin glargine  22 Units Subcutaneous Nightly    insulin lispro  0-20 Units Subcutaneous ACHS    insulin lispro  20 Units Subcutaneous 3xd Meals    lamoTRIgine  400 mg Oral Daily    lipase-protease-amylase  8 capsule Oral 3xd Meals    lisinopriL  10 mg Oral Daily    loratadine  10 mg Oral Daily    montelukast  10 mg Oral Nightly    pantoprazole  20 mg Oral Daily    MVW Complete (pediatric multivit 61-D3-vit K)  2 capsule Oral Daily    piperacillin-tazobactam (ZOSYN) IV (intermittent)  4.5 g Intravenous Q6H    polyethylene glycol  17 g Oral Daily    pramipexole  0.25 mg Oral Nightly    sod chlor-bicarb-squeez bottle  1 packet  Each Nare BID    sodium chloride 7%  4 mL Nebulization 4x Daily (RT)    traZODone  150 mg Oral Nightly     Continuous Infusions:  PRN Meds:.acetaminophen, aluminum-magnesium hydroxide-simethicone, dextrose, glucagon, glucose, guaiFENesin, oxyCODONE, pancrelipase (Lip-Prot-Amyl), senna     PHYSICAL EXAM:   BP 109/79  - Pulse 93  - Temp 36.9 ??C (98.4 ??F) (Oral)  - Resp 17  - Ht 182.9 cm (6' 0.01)  - Wt (!) 107 kg (235 lb 14.3 oz)  - SpO2 100%  - BMI 31.99 kg/m??   General: Alert and oriented, no acute distress  HEENT: MMM, clear oropharynx  CV: RRR, no m/r/g  Lungs: CTAB, no increased work of breathing  Abd: Soft, NT, ND, no rebound or guarding  Ext: Warm, well perfused, no peripheral edema  Skin: No rashes  Neuro: No focal deficits    LABORATORY and RADIOLOGY DATA:     Pertinent Laboratory Data from the last 24 hours:  Lab Results   Component Value Date    WBC 6.5 02/07/2022    HGB 13.8 02/07/2022    HCT 40.2 02/07/2022    PLT 207 02/07/2022     Lab Results   Component Value Date    NA 135 02/08/2022    NA 135 02/08/2022    K 3.6 02/08/2022    K 3.6 02/08/2022    CL 101 02/08/2022    CL 101 02/08/2022    CO2 28.0 02/08/2022    CO2 28.0 02/08/2022    BUN 12 02/08/2022    BUN 12 02/08/2022    CREATININE 0.74 02/08/2022    CREATININE 0.74 02/08/2022    GLU 335 (H) 02/08/2022    GLU 335 (H) 02/08/2022    CALCIUM 7.9 (L) 02/08/2022    CALCIUM 7.9 (L) 02/08/2022    MG 1.3 (L) 02/07/2022    PHOS 3.9 02/06/2022       Lab Results   Component Value Date    BILITOT 0.3 02/08/2022    BILIDIR 0.20 02/06/2022    PROT 6.3 02/08/2022    ALBUMIN 3.5 02/08/2022    ALT 55 (H) 02/08/2022    AST 34 02/08/2022    ALKPHOS 95 02/08/2022       Lab Results   Component Value Date    LABPROT 11.8 01/18/2020    INR 1.06 02/07/2022    APTT 31.6 11/03/2021       Pertinent Micro Data:  Blood Culture, Routine (no units)   Date Value   02/06/2022 No Growth at 72 hours   02/06/2022 No Growth at 72 hours     WBC (10*9/L)   Date Value   02/07/2022 6.5          Pertinent Imaging Data from the last 24 hours:  Reviewed in [facility]

## 2022-02-09 NOTE — Unmapped (Signed)
Patient alert oriented, afebrile, free from fall, complained of pain, PRN oxycodone given x1 with relief, contact/droplet precaution maintained, NPO post midnight for possible surgery today. Will endorse to next shift nurse.     Problem: Adult Inpatient Plan of Care  Goal: Plan of Care Review  Outcome: Progressing  Goal: Patient-Specific Goal (Individualized)  Outcome: Progressing  Goal: Absence of Hospital-Acquired Illness or Injury  Outcome: Progressing  Intervention: Identify and Manage Fall Risk  Recent Flowsheet Documentation  Taken 02/09/2022 0250 by Otho Perl, RN  Safety Interventions: family at bedside  Taken 02/08/2022 2029 by Otho Perl, RN  Safety Interventions:   family at bedside   lighting adjusted for tasks/safety   low bed   isolation precautions  Intervention: Prevent Skin Injury  Recent Flowsheet Documentation  Taken 02/08/2022 2029 by Otho Perl, RN  Skin Protection: adhesive use limited  Intervention: Prevent and Manage VTE (Venous Thromboembolism) Risk  Recent Flowsheet Documentation  Taken 02/08/2022 2029 by Otho Perl, RN  Activity Management: activity adjusted per tolerance  Goal: Optimal Comfort and Wellbeing  Outcome: Progressing  Goal: Readiness for Transition of Care  Outcome: Progressing  Goal: Rounds/Family Conference  Outcome: Progressing     Problem: Infection  Goal: Absence of Infection Signs and Symptoms  Outcome: Progressing  Intervention: Prevent or Manage Infection  Recent Flowsheet Documentation  Taken 02/08/2022 2029 by Otho Perl, RN  Isolation Precautions:   contact precautions maintained   droplet precautions maintained

## 2022-02-09 NOTE — Unmapped (Signed)
Patient A&Ox4, VSS, s/o at bedside, went to the OR today for sinus surgery per ENT. Up as lib, denies pain, RT gave CF meds , call light remains within patients reach, will continue to monitor.   Problem: Adult Inpatient Plan of Care  Goal: Plan of Care Review  Outcome: Progressing  Goal: Patient-Specific Goal (Individualized)  Outcome: Progressing  Goal: Absence of Hospital-Acquired Illness or Injury  Outcome: Progressing  Intervention: Identify and Manage Fall Risk  Recent Flowsheet Documentation  Taken 02/09/2022 1000 by Anne Shutter, RN  Safety Interventions:   fall reduction program maintained   family at bedside   infection management   isolation precautions   low bed   nonskid shoes/slippers when out of bed  Taken 02/09/2022 0800 by Anne Shutter, RN  Safety Interventions:   fall reduction program maintained   family at bedside   infection management   isolation precautions   low bed   nonskid shoes/slippers when out of bed  Intervention: Prevent and Manage VTE (Venous Thromboembolism) Risk  Recent Flowsheet Documentation  Taken 02/09/2022 1000 by Anne Shutter, RN  Activity Management: up ad lib  Taken 02/09/2022 0800 by Anne Shutter, RN  Activity Management: up ad lib  Goal: Optimal Comfort and Wellbeing  Outcome: Progressing  Goal: Readiness for Transition of Care  Outcome: Progressing  Goal: Rounds/Family Conference  Outcome: Progressing     Problem: Infection  Goal: Absence of Infection Signs and Symptoms  Outcome: Progressing  Intervention: Prevent or Manage Infection  Recent Flowsheet Documentation  Taken 02/09/2022 1000 by Anne Shutter, RN  Isolation Precautions:   contact precautions maintained   droplet precautions maintained  Taken 02/09/2022 0800 by Anne Shutter, RN  Isolation Precautions:   contact precautions maintained   droplet precautions maintained

## 2022-02-09 NOTE — Unmapped (Signed)
Afebrile, VSS. BG over 300 at lunch and dinner, provider notified. Plan to be NPO at 0000 for surgery tmrw. Nebs given by RT.     Problem: Adult Inpatient Plan of Care  Goal: Plan of Care Review  Outcome: Ongoing - Unchanged     Problem: Adult Inpatient Plan of Care  Goal: Patient-Specific Goal (Individualized)  Outcome: Ongoing - Unchanged     Problem: Adult Inpatient Plan of Care  Goal: Absence of Hospital-Acquired Illness or Injury  Outcome: Ongoing - Unchanged  Intervention: Identify and Manage Fall Risk  Recent Flowsheet Documentation  Taken 02/08/2022 0900 by Cherlyn Cushing, RN  Safety Interventions:   low bed   lighting adjusted for tasks/safety  Intervention: Prevent and Manage VTE (Venous Thromboembolism) Risk  Recent Flowsheet Documentation  Taken 02/08/2022 0900 by Cherlyn Cushing, RN  Activity Management: activity adjusted per tolerance

## 2022-02-09 NOTE — Unmapped (Signed)
OTOLARYNGOLOGY HEAD & NECK SURGERY- PROGRESS NOTE    Assessment/Plan:  36 year old male with history of asthma, diabetes, hypertension, cystic fibrosis and chronic rhinosinusitis who was transferred for admission due to cystic fibrosis exacerbation.  ENT consulted for work-up of chronic rhinosinusitis and possible revision sinus surgery.     - Plan for OR today for revision sinus surgery. Keep NPO  - Recommend continued IV antibiotics  - Recommend holding anticoagulation the morning of surgery, and ideally for 5 days postoperatively.  Primary team to discuss with heme-onc.    Principal Problem:    Cystic fibrosis exacerbation (CMS-HCC)  Active Problems:    Essential hypertension    Depressive disorder    Mood disorder (CMS-HCC)    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)    Chronic pansinusitis    Pancreatic insufficiency due to cystic fibrosis (CMS-HCC)    Bronchiectasis (CMS-HCC)    Chronic deep vein thrombosis (DVT) of lower extremity (CMS-HCC)       LOS: 3 days       Subjective:  No acute events overnight. Plan for OR today    Objective:     Vital signs in last 24 hours:  Temp:  [36.6 ??C (97.9 ??F)-37.1 ??C (98.8 ??F)] 36.7 ??C (98.1 ??F)  Heart Rate:  [60-106] 73  Resp:  [16-18] 17  BP: (101-114)/(62-79) 113/75  MAP (mmHg):  [74-90] 85  SpO2:  [94 %-99 %] 98 %    Intake/Output last 24 hours:  I/O last 3 completed shifts:  In: 500 [P.O.:480; I.V.:20]  Out: -     Physical Exam  Constitutional:  Vitals reviewed on nursing chart  General: sitting up in NAD, normal appearance and voice  Respiration:  breathing comfortably on room air; no stridor/stertor  Eyes:  EOM Intact, sclera anicteric, no conjunctival injection  Neuro:  Alert and oriented times 3, Cranial nerves 2-12 intact and symmetric bilaterally.   Head and Face:  Skin with no masses or lesions, sinuses nontender to palpation, facial nerve fully intact, sensation intact throughout all distribution of CNV  Ears:  Normal external ears, normal hearing to whispered voice.   Nose:  External nose midline, anterior rhinoscopy is normal with limited visualization just to the anterior interior turbinate.  See scope exam.  Oral Cavity/Lips: normal mucosa, no lesions, tongue mobile, healthy dentition and gingiva  Neck/Lymph:  Neck soft, flat, trachea midline.

## 2022-02-09 NOTE — Unmapped (Signed)
Otolaryngology Treatment Plan    36 year old male with history of asthma, diabetes, hypertension, cystic fibrosis and chronic rhinosinusitis who was transferred for admission due to cystic fibrosis exacerbation.  ENT consulted for work-up of chronic rhinosinusitis and possible revision sinus surgery.  ??  - Plan for OR add on tomorrow. Please make sure NPO at midnight.  - Recommend holding anticoagulation the morning of surgery, and ideally for 5 days postoperatively.  Primary team to discuss with heme-onc.

## 2022-02-09 NOTE — Unmapped (Signed)
Hospital Medicine Daily Progress Note    Assessment/Plan:  Principal Problem:    Cystic fibrosis exacerbation (CMS-HCC)  Active Problems:    Essential hypertension    Depressive disorder    Mood disorder (CMS-HCC)    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)    Chronic pansinusitis    Pancreatic insufficiency due to cystic fibrosis (CMS-HCC)    Bronchiectasis (CMS-HCC)    Chronic deep vein thrombosis (DVT) of lower extremity (CMS-HCC)  Resolved Problems:    * No resolved hospital problems. *           Filippo Puls is a 36 y.o. male with past medical history significant for cystic fibrosis, essential hypertension, depressive disorder, diabetes related to cystic fibrosis, cystic fibrosis related sinus disease, pancreatic insufficiency due to cystic fibrosis, and history of DVT who presented to [facility] with Cystic fibrosis exacerbation (CMS-HCC).    Cystic fibrosis exacerbation: Patient presented to the ED with worsening fatigue, dyspnea and increased sputum production in the setting of recent rhinovirus for which he had been started on a course of PO Levaquin and 1 month of inhaled colistin. On admission,  RVP negative, CF sputum culture negative.  Chest x-ray with streaky right lower lung opacity, likely atelectasis.    -Pulmonology following  -Continue IV Zosyn, anticipate a 2-week course with final duration based on inflammatory markers and clinical response (6/27-7/10)  -Continue inhaled colistin x28 days (6/15-7/12)  -albuterol nebs, 7% hypertonic saline, chest vest 4x daily for airway clearance  -Continue home Trikafta, Pulmozyme, Breo Ellipta  -Follow-up PFTs ordered 6/28  -Weekly CRP/ESR, CBC w/ diff, CMP    CF related sinus disease: Repeat CT sinus this admission showed near complete opacification of the bilateral maxillary sinuses, although the ostiomeatal units are patent bilaterally.   -ENT following  -Pending revision sinus surgery with ENT today, 6/30  - will need at least 5 days of antibiotics post-operatively  - see below for heme recs for anticoagulation  -Continue home Flonase, Claritin, Singulair    CF related diabetes, poorly-controlled: A1c 11.3 on admission, up from 10.6 in March.  Uses an insulin pump at home but didn't bring it with him. During last hospitalization received Lantus 45 units nightly and lispro 20 units TID AC.  Per endocrinology, secondary to current insulin requirement, anticipate patient will need subcutaneous insulin with pens at discharge.  -Appreciate endocrinology consultation  -Continue previous regimen of Lantus 45 U at bedtime, lispro 20 U TID AC, SSI   -Outpatient endocrinology follow-up    History of DVT:  Hematology consulted given upcoming surgery in setting of unclear clotting history. They recommend transitioning to prophylactic anticoagulation with outpatient follow up for long-term anticoagulation planning.   - prophylactic enoxaparin; hold day of surgery, 6/30, and for 5 days after surgery (resume 6/6)  - when resuming anticoagulation will do so at prophylactic dosing with rivoroxiban 10 mg daily (was previously on 20 mg daily)  -Outpatient hematology follow-up    CF related pancreatic insufficiency:  -Continue home Creon    Mood disorder:   -Continue home fluoxetine, amitriptyline, lamotrigine, trazodone    Hypertension: Blood pressure currently stable.  -Continue home lisinopril    GERD:  -Continue formulary equivalent of home PPI    Restless leg syndrome:   -Continue home Mirapex    Chronic back pain: Patient reports he is no longer taking gabapentin.    Elevated liver enzymes (resolved): Has had mild elevations in the past which may be related to fatty liver disease.  LFTs currently stable.    Dispo: It uncertain if patient may be a candidate for home IV antibiotics; will need to reassess later in his stay after OR.  Please see the note from Dr. [name], primary pulmonologist, dated 6/27 for details of care coordination prior to consideration of home infusion.    I personally spent 20 minutes face-to-face and non-face-to-face in the care of this patient, which includes all pre, intra, and post visit time on the date of service.  All documented time was specific to the E/M visit and does not include any procedures that may have been performed.     ___________________________________________________________________  Subjective:  Patient seen and examined.  No acute events overnight.  Family at bedside.  Patient lying in bed watching television.  Denies any shortness of breath or chest pain.  Reports a productive cough, that he states is at baseline.  Denies any abdominal pain, nausea, vomiting, or diarrhea.  Patient anxious for procedure today so he can, drink some water .  Patient appears in no acute distress.    Labs/Studies:  Labs and Studies from the last 24hrs per EMR and Reviewed    Objective:  Temp:  [36.6 ??C (97.9 ??F)-37.1 ??C (98.8 ??F)] 36.9 ??C (98.4 ??F)  Heart Rate:  [60-106] 93  Resp:  [16-18] 17  BP: (101-113)/(62-79) 109/79  SpO2:  [94 %-100 %] 100 %    General: awake, alert.  No acute distress.  Lying in bed watching television.  Skin: Warm, dry, and intact, without obvious rashes or lesions on clothed exam. Appropriate color for ethnicity.   Head: Normocephalic   Eyes: Conjunctivae clear without exudates or hemorrhage. Eyelids normal in appearance without swelling or lesions.  Cardiac: Heart rate and rhythm normal, no edema in extremities, 2+ radial pulses bilaterally  Psychiatric: appropriate mood and pleasant affect  Neurological: Awake, alert with normal speech.  No gross neurological deficits.  Spontaneously moving all 4 extremities.  GU: Deferred  GI: Abdomen soft and symmetric without distention, bowel sounds present and normoactive in all four quadrants. No wincing or grimacing with palpation  Respiratory: chest wall symmetric and without deformity, no respiratory distress, lung sounds CTA, diminished at the bases bilaterally

## 2022-02-10 MED ADMIN — polyethylene glycol (MIRALAX) packet 17 g: 17 g | ORAL | @ 13:00:00

## 2022-02-10 MED ADMIN — atorvastatin (LIPITOR) tablet 40 mg: 40 mg | ORAL | @ 01:00:00

## 2022-02-10 MED ADMIN — insulin lispro (HumaLOG) injection 20 Units: 20 [IU] | SUBCUTANEOUS | @ 01:00:00

## 2022-02-10 MED ADMIN — lipase-protease-amylase (CREON) 36,000-114,000- 180,000 unit capsule 288,000 units of lipase - Patient Supplied: 8 | ORAL | @ 22:00:00

## 2022-02-10 MED ADMIN — piperacillin-tazobactam (ZOSYN) IVPB (premix) 4.5 g: 4.5 g | INTRAVENOUS | @ 08:00:00 | Stop: 2022-02-20

## 2022-02-10 MED ADMIN — lipase-protease-amylase (CREON) 36,000-114,000- 180,000 unit capsule 288,000 units of lipase - Patient Supplied: 8 | ORAL | @ 19:00:00

## 2022-02-10 MED ADMIN — oxyCODONE (ROXICODONE) immediate release tablet 5 mg: 5 mg | ORAL | @ 14:00:00 | Stop: 2022-02-10

## 2022-02-10 MED ADMIN — montelukast (SINGULAIR) tablet 10 mg: 10 mg | ORAL | @ 01:00:00

## 2022-02-10 MED ADMIN — oxyCODONE (ROXICODONE) immediate release tablet 5 mg: 5 mg | ORAL | @ 02:00:00 | Stop: 2022-02-20

## 2022-02-10 MED ADMIN — pramipexole (MIRAPEX) tablet 0.25 mg: .25 mg | ORAL | @ 01:00:00

## 2022-02-10 MED ADMIN — albuterol 2.5 mg /3 mL (0.083 %) nebulizer solution 2.5 mg: 2.5 mg | RESPIRATORY_TRACT | @ 16:00:00

## 2022-02-10 MED ADMIN — sod chlor-bicarb-squeez bottle (NEILMED) nasal packet 1 packet: 1 | NASAL | @ 17:00:00 | Stop: 2022-02-13

## 2022-02-10 MED ADMIN — MORPhine 4 mg/mL injection 4 mg: 4 mg | INTRAVENOUS | @ 01:00:00 | Stop: 2022-02-09

## 2022-02-10 MED ADMIN — piperacillin-tazobactam (ZOSYN) IVPB (premix) 4.5 g: 4.5 g | INTRAVENOUS | @ 19:00:00 | Stop: 2022-02-20

## 2022-02-10 MED ADMIN — colistimethate (COLYMYCIN) 150 mg in sodium chloride (NS) 4 mL inhalation: 150 mg | RESPIRATORY_TRACT

## 2022-02-10 MED ADMIN — lamoTRIgine (LaMICtal) tablet 400 mg: 400 mg | ORAL | @ 13:00:00

## 2022-02-10 MED ADMIN — sodium chloride 7% NEBULIZER solution 4 mL: 4 mL | RESPIRATORY_TRACT | @ 16:00:00

## 2022-02-10 MED ADMIN — colistimethate (COLYMYCIN) 150 mg in sodium chloride (NS) 4 mL inhalation: 150 mg | RESPIRATORY_TRACT | @ 13:00:00

## 2022-02-10 MED ADMIN — piperacillin-tazobactam (ZOSYN) IVPB (premix) 4.5 g: 4.5 g | INTRAVENOUS | @ 14:00:00 | Stop: 2022-02-20

## 2022-02-10 MED ADMIN — insulin lispro (HumaLOG) injection 0-20 Units: 0-20 [IU] | SUBCUTANEOUS | @ 22:00:00

## 2022-02-10 MED ADMIN — insulin glargine (LANTUS) injection 22 Units: 22 [IU] | SUBCUTANEOUS | @ 15:00:00 | Stop: 2022-02-10

## 2022-02-10 MED ADMIN — insulin lispro (HumaLOG) injection 20 Units: 20 [IU] | SUBCUTANEOUS | @ 22:00:00

## 2022-02-10 MED ADMIN — insulin lispro (HumaLOG) injection 0-20 Units: 0-20 [IU] | SUBCUTANEOUS | @ 13:00:00 | Stop: 2022-02-10

## 2022-02-10 MED ADMIN — dornase alfa (PULMOZYME) 1 mg/mL solution 2.5 mg: 2.5 mg | RESPIRATORY_TRACT | @ 13:00:00

## 2022-02-10 MED ADMIN — lipase-protease-amylase (CREON) 36,000-114,000- 180,000 unit capsule 288,000 units of lipase - Patient Supplied: 8 | ORAL | @ 01:00:00

## 2022-02-10 MED ADMIN — pediatric multivitamin-vit D3 1,500 unit-vit K 800 mcg (MVW COMPLETE FORMULATION) capsule: 2 | ORAL | @ 13:00:00

## 2022-02-10 MED ADMIN — elexacaftor-tezacaftor-ivacaft (TRIKAFTA) tablet 2 tablet **Patient Supplied**: 2 | ORAL | @ 13:00:00

## 2022-02-10 MED ADMIN — acetaminophen (TYLENOL) tablet 650 mg: 650 mg | ORAL | @ 22:00:00 | Stop: 2022-02-11

## 2022-02-10 MED ADMIN — fluticasone propionate (FLONASE) 50 mcg/actuation nasal spray 2 spray: 2 | NASAL | @ 17:00:00

## 2022-02-10 MED ADMIN — albuterol 2.5 mg /3 mL (0.083 %) nebulizer solution 2.5 mg: 2.5 mg | RESPIRATORY_TRACT | @ 19:00:00

## 2022-02-10 MED ADMIN — sodium chloride 7% NEBULIZER solution 4 mL: 4 mL | RESPIRATORY_TRACT | @ 19:00:00

## 2022-02-10 MED ADMIN — amitriptyline (ELAVIL) tablet 100 mg: 100 mg | ORAL | @ 01:00:00

## 2022-02-10 MED ADMIN — piperacillin-tazobactam (ZOSYN) IVPB (premix) 4.5 g: 4.5 g | INTRAVENOUS | @ 01:00:00 | Stop: 2022-02-20

## 2022-02-10 MED ADMIN — oxyCODONE (ROXICODONE) immediate release tablet 10 mg: 10 mg | ORAL | @ 19:00:00 | Stop: 2022-02-24

## 2022-02-10 MED ADMIN — insulin lispro (HumaLOG) injection 0-20 Units: 0-20 [IU] | SUBCUTANEOUS | @ 19:00:00 | Stop: 2022-02-10

## 2022-02-10 MED ADMIN — traZODone (DESYREL) tablet 150 mg: 150 mg | ORAL | @ 01:00:00

## 2022-02-10 MED ADMIN — insulin lispro (HumaLOG) injection 20 Units: 20 [IU] | SUBCUTANEOUS | @ 15:00:00 | Stop: 2022-02-10

## 2022-02-10 MED ADMIN — FLUoxetine (PROzac) capsule 60 mg: 60 mg | ORAL | @ 13:00:00

## 2022-02-10 MED ADMIN — sodium chloride 7% NEBULIZER solution 4 mL: 4 mL | RESPIRATORY_TRACT

## 2022-02-10 MED ADMIN — loratadine (CLARITIN) tablet 10 mg: 10 mg | ORAL | @ 13:00:00

## 2022-02-10 MED ADMIN — pantoprazole (PROTONIX) EC tablet 20 mg: 20 mg | ORAL | @ 13:00:00

## 2022-02-10 MED ADMIN — lisinopriL (PRINIVIL,ZESTRIL) tablet 10 mg: 10 mg | ORAL | @ 13:00:00

## 2022-02-10 MED ADMIN — acetaminophen (TYLENOL) tablet 650 mg: 650 mg | ORAL | @ 05:00:00 | Stop: 2022-02-11

## 2022-02-10 MED ADMIN — sodium chloride 7% NEBULIZER solution 4 mL: 4 mL | RESPIRATORY_TRACT | @ 13:00:00

## 2022-02-10 MED ADMIN — insulin lispro (HumaLOG) injection 0-20 Units: 0-20 [IU] | SUBCUTANEOUS | @ 01:00:00

## 2022-02-10 MED ADMIN — albuterol 2.5 mg /3 mL (0.083 %) nebulizer solution 2.5 mg: 2.5 mg | RESPIRATORY_TRACT | @ 13:00:00

## 2022-02-10 MED ADMIN — oxyCODONE (ROXICODONE) immediate release tablet 10 mg: 10 mg | ORAL | @ 08:00:00 | Stop: 2022-02-10

## 2022-02-10 MED ADMIN — HYDROmorphone (PF) (DILAUDID) injection 1 mg: 1 mg | INTRAVENOUS | @ 17:00:00 | Stop: 2022-02-10

## 2022-02-10 MED ADMIN — fluticasone furoate-vilanteroL (BREO ELLIPTA) 200-25 mcg/dose inhaler 1 puff: 1 | RESPIRATORY_TRACT | @ 13:00:00

## 2022-02-10 MED ADMIN — elexacaftor-tezacaftor-ivacaft (TRIKAFTA) tablet 1 tablet **Patient Supplied**: 1 | ORAL | @ 01:00:00

## 2022-02-10 MED ADMIN — insulin glargine (LANTUS) injection 22 Units: 22 [IU] | SUBCUTANEOUS | @ 01:00:00

## 2022-02-10 MED ADMIN — lipase-protease-amylase (CREON) 36,000-114,000- 180,000 unit capsule 288,000 units of lipase - Patient Supplied: 8 | ORAL | @ 13:00:00

## 2022-02-10 MED ADMIN — acetaminophen (TYLENOL) tablet 650 mg: 650 mg | ORAL | @ 16:00:00 | Stop: 2022-02-11

## 2022-02-10 MED ADMIN — elexacaftor-tezacaftor-ivacaft (TRIKAFTA) tablet 1 tablet **Patient Supplied**: 1 | ORAL | @ 22:00:00

## 2022-02-10 MED ADMIN — insulin lispro (HumaLOG) injection 20 Units: 20 [IU] | SUBCUTANEOUS | @ 19:00:00 | Stop: 2022-02-10

## 2022-02-10 MED ADMIN — albuterol 2.5 mg /3 mL (0.083 %) nebulizer solution 2.5 mg: 2.5 mg | RESPIRATORY_TRACT

## 2022-02-10 MED ADMIN — acetaminophen (TYLENOL) tablet 650 mg: 650 mg | ORAL | @ 10:00:00 | Stop: 2022-02-11

## 2022-02-10 NOTE — Unmapped (Signed)
Pt alert x oriented, afebrile, VSS. Pt pain control was an issue overnight. Pt pain not controlled on prn 5mg  Oxy. MD ordered scheduled Tylenol and 4mg  IV Morphine as additional interventions. Pt still complaining of pain 9/10, MD notified. An additional 10 mg of Oxy was ordered and given. Pt now reports pain 6/10. Pt without any other complaints overnight. Pt free from falls and injuries. Will continue to monitor pt status.

## 2022-02-10 NOTE — Unmapped (Signed)
Pulmonary Consult Service  Follow-Up Note      Primary Service:   Hospitalist- [place]  Primary Service Attending:  Caren Hazy, PA  Reason for Consult:   Cystic fibrosis    IMPRESSION and RECOMMENDATIONS     Christian Young is a 35 y.o. male with a history of cystic fibrosis (S945L and [phone] insertion) on elexacaftor/tezacaftor/ivacaftor with pulmonary, sinus, pancreatic, and gastrointestinal manifestations who presents with a cystic fibrosis flare and sinus infection. Follows w/ [facility]. PFTs with a drop in his FEV1 of over 10%.  We recommend treating this with antibiotics and airway clearance. He is s/p sinus surgery.    #Cystic fibrosis exacerbation  - Continue IV piperacillin/tazobactam (06/15- ); duration to be determined by clinical course and response to the surgery  - Continue inhaled colistin (06/15- ) for planned 28-day course  - Continue airway clearance with 7% hypertonic saline and albuterol pretreatment 4 times daily; continue mechanical airway clearance with chest vest or any device at patient's preference  -Trend CRP and ESR weekly  - f/u sinus cultures (NGTD)  - Prior CF Cx w/ Mucoid & Smooth PSA, +carbapenem & fluoroquinolone resistance  - Anticipate repeat PFTs later this week (~7/5) as long as there is no ENT contra-indication    #Cystic fibrosis-related sinus disease  - s/p sinus surgery w/ ENT on 6/30  - Agree with hematology consult for perioperative anticoagulation management    #Cystic fibrosis-related pancreatic insufficiency diabetes  - Agree with endocrine consult    #Cystic fibrosis-related constipation  - Agree with bowel regimen    Christian Young was seen, examined and discussed with  Dr. Kerrie Buffalo  Thank you for involving [name] in his care. We look forward to following with you.  Please don't hesitate to page [name] with any questions or concerns at [phone] (pulmonary consult fellow).    Christian Virgil E. Talmadge [employer], Christian Young  Pulmonary & Critical Care Fellow  Pager: [phone]    02/10/2022      SUBJECTIVE:     Difficulty w/ pain control s/p sinus surgery. Morphine works but wears off quickly. Appetite is low but he is moving his bowels, taking miralax, has been up ambulating. Doing airway clearance w/o difficulty    Medications:     Scheduled Meds:   acetaminophen  650 mg Oral Q6H    albuterol  2.5 mg Nebulization 4x Daily (RT)    amitriptyline  100 mg Oral Nightly    atorvastatin  40 mg Oral Nightly    colistimetate (COLISTIN) inhalation  150 mg Inhalation Q12H SCH    dornase alfa  2.5 mg Inhalation Daily    elexacaftor-tezacaftor-ivacaft  2 tablet Oral Daily    And    elexacaftor-tezacaftor-ivacaft  1 tablet Oral Q PM    FLUoxetine  60 mg Oral Daily    fluticasone furoate-vilanteroL  1 puff Inhalation Daily (RT)    fluticasone propionate  2 spray Each Nare Daily    insulin glargine  22 Units Subcutaneous Nightly    insulin glargine  22 Units Subcutaneous Once    insulin lispro  0-20 Units Subcutaneous ACHS    insulin lispro  20 Units Subcutaneous 3xd Meals    lamoTRIgine  400 mg Oral Daily    lipase-protease-amylase  8 capsule Oral 3xd Meals    lisinopriL  10 mg Oral Daily    loratadine  10 mg Oral Daily    montelukast  10 mg Oral Nightly    pantoprazole  20 mg Oral Daily  MVW Complete (pediatric multivit 61-D3-vit K)  2 capsule Oral Daily    piperacillin-tazobactam (ZOSYN) IV (intermittent)  4.5 g Intravenous Q6H    polyethylene glycol  17 g Oral Daily    pramipexole  0.25 mg Oral Nightly    sod chlor-bicarb-squeez bottle  1 packet Each Nare BID    sodium chloride 7%  4 mL Nebulization 4x Daily (RT)    traZODone  150 mg Oral Nightly     Continuous Infusions:  PRN Meds:.aluminum-magnesium hydroxide-simethicone, dextrose, glucagon, glucose, guaiFENesin, oxyCODONE, pancrelipase (Lip-Prot-Amyl), senna     PHYSICAL EXAM:   BP 113/74  - Pulse 91  - Temp 36.7 ??C (98.1 ??F) (Oral)  - Resp 18  - Ht 182.9 cm (6' 0.01)  - Wt (!) 107 kg (235 lb 14.3 oz)  - SpO2 97%  - BMI 31.99 kg/m?? General: Alert and oriented, no acute distress  HEENT: MMM, nasal packing in place w/ sutures taped to bridge of nose  CV: RRR, no m/r/g  Lungs: CTAB, no increased work of breathing  Abd: Soft, NT, ND, no rebound or guarding  Ext: Warm, well perfused, no peripheral edema  Skin: No rashes  Neuro: No focal deficits    LABORATORY and RADIOLOGY DATA:     Pertinent Laboratory Data from the last 24 hours:  Lab Results   Component Value Date    WBC 6.5 02/07/2022    HGB 13.8 02/07/2022    HCT 40.2 02/07/2022    PLT 207 02/07/2022     Lab Results   Component Value Date    NA 135 02/08/2022    NA 135 02/08/2022    K 3.6 02/08/2022    K 3.6 02/08/2022    CL 101 02/08/2022    CL 101 02/08/2022    CO2 28.0 02/08/2022    CO2 28.0 02/08/2022    BUN 12 02/08/2022    BUN 12 02/08/2022    CREATININE 0.74 02/08/2022    CREATININE 0.74 02/08/2022    GLU 335 (H) 02/08/2022    GLU 335 (H) 02/08/2022    CALCIUM 7.9 (L) 02/08/2022    CALCIUM 7.9 (L) 02/08/2022    MG 1.3 (L) 02/07/2022    PHOS 3.9 02/06/2022       Lab Results   Component Value Date    BILITOT 0.3 02/08/2022    BILIDIR 0.20 02/06/2022    PROT 6.3 02/08/2022    ALBUMIN 3.5 02/08/2022    ALT 55 (H) 02/08/2022    AST 34 02/08/2022    ALKPHOS 95 02/08/2022       Lab Results   Component Value Date    LABPROT 11.8 01/18/2020    INR 1.06 02/07/2022    APTT 31.6 11/03/2021       Pertinent Micro Data:  Blood Culture, Routine (no units)   Date Value   02/06/2022 No Growth at 72 hours   02/06/2022 No Growth at 72 hours     WBC (10*9/L)   Date Value   02/07/2022 6.5          Pertinent Imaging Data from the last 24 hours:  Reviewed in [facility]

## 2022-02-10 NOTE — Unmapped (Signed)
Otolaryngology/Head and Neck Surgery Operative Report    Date of Service:  02/09/2022    Attending Physician: Junius Finner, MD, PhD    Preoperative Diagnosis:   1. Asthma  2. Diabetes  3. Hypertension  4. Cystic fibrosis  5. Chronic rhinosinusitus s/p prior FESS  6. ASA class 4    Postoperative Diagnosis: Same     Procedure(s) Performed:   1. Bilateral nasal endoscopy total ethmoidectomy, including sphenoidotomy with removal of tissue (CPT 31259-50)   2. Bilateral nasal endoscopy with frontal sinusotomy, (CPT S3697588).    3. Bilateral medial maxillectomy (CPT N6930041).   4. Septoplasty (CPT 30520)  5. Bilateral outfracture of inferior turbinates (CPT 30930-50)  6. Stereotactic Computer assisted naviagtion, extradural (CPT [id number])     Operative Findings:   1) Septal spur along left nasal floor and deviating posteriorly to the left at the level of the middle turbinate. Endoscopic septoplasty via right hemitransfixion incision with improvement in nasal airway caliber. Doyle splints placed  2) Evidence of prior sinus surgery with bilateral revision total ethmoidectomy, sphenoidotomy, frontal sinusotomy, and medial maxillectomy performed  3) Propel stents placed in the frontal recess bilaterally  4) 5.5 cm finger cots placed in the ethmoid beds bilaterally. Bilateral Doyle splints placed and secured with 3-0 Nylon in a transseptal fashion    Teaching Surgeon: Junius Finner, MD PhD    Resident Surgeon: Leodis Sias, MD    Anesthesia: General with endotracheal tube intubation    Estimated Blood Loss: 100 mL's    Complications: None    Specimens: Right and left sinus contents    Indications for Surgery: Christian Young is a 36 y.o. male with a long-standing history of chronic rhinosinusitis in the setting of CF with prior sinus surgery.The patient was evaluated and found to have the persistent evidence of sinusitis on nasal endoscopy. The patient underwent a CT preoperatively, confirming the above-mentioned diagnoses.  The patient has failed medical management. The patient was instructed on the risks, benefits and possible complications of the procedure and informed consent was then obtained.      Procedure: The patient was identified in the pre-operative holding area. The consent for surgery was reaffirmed and all questions were answered. The patient agreed to proceed with surgery.    The patient was taken to the operating theater and was placed in the supine position on the operating table. A pre-induction timeout was performed confirming the patient's name, medical record number, date of birth, the planned operative procedure, and patient's medication allergies. The patient then underwent induction of general anesthesia and intubation by the Anesthesia staff. The table was then rotated 90 degrees.    The stereotactic CT image guidance system was then brought to the field. The CT data were loaded, reviewed, and an operative plan was finalized. The patient underwent surface match registration that was confirmed accurate in 3 separate planes. The patient was prepped and draped in the standard fashion for endoscopic sinus surgery. The bilateral nasal cavities were prepared with epinephrine pledgets.     Next, the 0-degree endoscope was used to visualize the each nasal cavity.     1% Lidocaine with 1:100,000 Epinephrine was then injected into the anterior nasal septum.     The procedure began with right medial maxillectomy. An incision was made through mucosa overlying the lacrimal bone just posterior to the lacrimal duct. The inferior turbinate was excised at this level with care to preserve the head of the inferior turbinate. The medial wall of the maxillary  sinus was then removed down to the level of the nasal floor. Curved instruments and the microdebrider were used to remove polypoid tissue from the maxillary sinus. The sinus was then copiously irrigated with sterile saline.     Then right nasal endoscopy with total ethmoidectomy was performed. The middle turbinate was excised using a micro-[place]. The residual ethmoid bulla was removed with a J-curette. Remaining anterior ethmoid cells were taken down with curettes. The basal lamella was then entered.  All anterior and posterior ethmoid cells were completely removed with curettes and the Koros. The skull base and lamina papyracea was identified with image guidance. The anterior and posterior ethmoid cells were meticulously taken off the skull base as well as the lamina papyracea with use of angled instrumentation in a posterior to anterior fashion. The sinus was copiously irrigated.    Next,  right sphenoid endoscopy with mucous membrane was performed. The residual superior turbinate was cut sharply. The sphenoid os was identified and then entered. The anterior face of the sphenoid was then enlarged with Koros.  The skull base was identified within the sphenoid sinus. The sinus then was copiously irrigated.    Next,  right  nasal endoscopy with frontal sinusotomy was performed. The skull base was then followed anteriorly. The frontal sinus was then entered. Angled instrumentation and an angled microdebrider was then used to open the natural frontal sinus outflow tract circumferentially. The frontal sinus was noted to be widely patent. The sinus then was copiously irrigated.    Attention was then turned towards the septoplasty portion of the procedure.     A hemitransfixion incision on the right was made in the standard fashion. Care was taken to deepen this incision to the level of the mucoperichondrium which was then incised. A Cottle elevator was then utilized to develop a sub-mucoperichondrial plane in an anterior to posterior fashion. Once this pocket was large enough to accommodate a 0?? Hopkins telescope this was inserted into the pocket and utilized to directly visualize the development of this submucoperichondrial plane posteriorly. During this dissection care was taken to ensure a broad plane in a cranial caudal fashion was developed. Ultimately the osseocartilaginous junction was reached and a sub-mucoperiosteal plane was developed an anterior to posterior fashion from the site. With the left mucoperichondrial and mucoperiosteal pocket developed the osseocartilaginous junction was brought into view and meticulously separated allowing the Cottle to insinuate this junction and transition to a contralateral sub-mucoperiosteal pocket. This plane was subsequently developed an anterior posterior fashion.    To facilitate this dissection the ophthalmic crescent knife was brought in the field and utilized to perform a caudal cartilaginous incision posterior to the hemitransfixion incision. With this performed a Cottle elevator was utilized to further develop this incision and gain access to the contralateral side where a right sub-mucoperichondrial plane was developed in an anterior posterior fashion to the level of the previously disarticulated osseocartilaginous junction. This dissection allowed isolation of the cartilaginous septum from the overlying mucosa. With this degree of dissection a Blakes[place] was brought into the field and utilized to progressively incise the dorsal aspect of the quadrangular cartilage to the level of the osseous septum. Once completed the quadrangle cartilage was removed including a large left-sided spur at the level of the middle turbinate in addition to a spur along the left nasal floor.     Attention was then returned towards dissection of the osseous septum. Utilizing the 0?? telescope and a combination of a Fish farm manager the  mucoperiosteum was progressively dissected from each side of the osseous septum. Meticulous inferior dissection about the osseous septum and maxillary crest was performed. With this completed a Laren Boom was utilized to progressively remove the osseous septum.     The bilateral nasal passages were then reevaluated utilizing the 0?? Hopkins telescope revealing resolution of nasal obstruction secondary bilaterally.     The septal pocket was then irrigated using sterile saline. With this complete it was reapproximated at hemitransfixion incision with multiple 3-0 chromic gut sutures.     Next, the 0-degree endoscope was used to visualize the left nasal cavity.     Attention was then turned towards the left medial maxillectomy. An incision was made through mucosa overlying the lacrimal bone just posterior to the lacrimal duct. The inferior turbinate was excised at this level with care to preserve the head of the inferior turbinate. The medial wall of the maxillary sinus was then removed down to the level of the nasal floor. Curved instruments and the microdebrider were used to remove polypoid tissue from the maxillary sinus. The sinus was then copiously irrigated with sterile saline.     Then left nasal endoscopy with total ethmoidectomy was performed. The ethmoid bulla was removed with a J-curette. Anterior ethmoid cells were taken down with curettes.  All residual anterior and posterior ethmoid cells were completely removed with curettes and the Koros. The skull base and lamina papyracea was identified with image guidance. The anterior and posterior ethmoid cells were meticulously taken off the skull base as well as the lamina papyracea with use of angled instrumentation in a posterior to anterior fashion.     Next,  left sphenoid endoscopy with mucous membrane was performed. The superior turbinate was cut sharply. The sphenoid os was identified and then entered. The anterior face of the sphenoid was then enlarged with Koros forceps. The skull base was identified within the sphenoid sinus. The sinus then was copiously irrigated.    Next,  left  nasal endoscopy with frontal sinusotomy was performed. The skull base was then followed anteriorly. The frontal sinus was then entered. Angled instrumentation and an angled microdebrider was then used to open the natural frontal sinus outflow tract circumferentially. The frontal sinus was noted to be widely patent. The sinus then was copiously irrigated.    Next, the bilateral nasal cavities were irrigated and hemostasis was achieved. Propel stents were placed in the frontal sinus. Finger cots were placed bilaterally that were roughly 5.5 cm in length. Doyle splints were hen placed and secured with 3-0 Nylon. The patient was then suctioned with an orogastric tube. The patient was then rotated back toward anesthesia for reversal of general anesthetic and extubation.     The patient tolerated the procedure well and there were no immediate postoperative complications.     Teaching Surgeon Attestation: Please note that Junius Finner, MD, PhD was present and participated in all portions of this case.

## 2022-02-10 NOTE — Unmapped (Signed)
Otolaryngology Daily Progress Note    Assessment/Plan:  35 year old male with history of asthma, diabetes, hypertension, cystic fibrosis and chronic rhinosinusitis who was transferred for admission due to cystic fibrosis exacerbation, now s/p revision FESS 6/30    - Recommend continued IV antibiotics  - Nasal saline irrigations BID starting POD1. Can use a 60 mL Toomey syringe filled with sterile saline. Lean over the sink and irrigate each nostril. If not tolerating can do nasal saline sprays (3-4 sprays in each nare QID)  - Finger cots and Doyle splints will be removed POD3  - Hold anticoagulation until POD5, please continue to monitor inpatient until anticoagulation is started  - We will continue to follow  - Please reach out to the ENT team with questions, concerns, or changes in clinical status    Subjective:  NAEON. Pain controlled.  Doing well. No salty or metallic taste. No posterior oropharyngeal drainage. Vision exam stable.     Objective:    Vital signs in last 24 hours:  Temp:  [36.1 ??C (97 ??F)-36.9 ??C (98.4 ??F)] 36.7 ??C (98.1 ??F)  Heart Rate:  [88-100] 91  SpO2 Pulse:  [90-100] 96  Resp:  [15-25] 18  BP: (109-132)/(71-90) 113/74  MAP (mmHg):  [85-100] 87  FiO2 (%):  [28 %] 28 %  SpO2:  [90 %-100 %] 97 %    Intake/Output last 3 shifts:  I/O last 3 completed shifts:  In: 1550 [P.O.:490; I.V.:1010; IV Piggyback:50]  Out: 100 [Blood:100]  Intake/Output this shift:  No intake/output data recorded.    Physical Exam:  General: Awake, alert, oriented sitting upright in bed in NAD.   Head and Face: NCAT; HB 1/6; Fingercots in place bilaterally taped to nasal dorsum  Eyes: EOMI - sclera and conjunctiva clear; able to finger count appropriately  OC/OP: clear - FOM soft and tongue protrudes midline  Neck: soft, flat  Pulm: Unlabored - no stridor/stertor

## 2022-02-10 NOTE — Unmapped (Signed)
Hospital Medicine Daily Progress Note    Assessment/Plan:    Principal Problem:    Cystic fibrosis exacerbation (CMS-HCC)  Active Problems:    Essential hypertension    Depressive disorder    Mood disorder (CMS-HCC)    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)    Chronic pansinusitis    Pancreatic insufficiency due to cystic fibrosis (CMS-HCC)    Bronchiectasis (CMS-HCC)    Chronic deep vein thrombosis (DVT) of lower extremity (CMS-HCC)  Resolved Problems:    * No resolved hospital problems. *                 Christian Young is a 36 y.o. male that presented to [facility] with Cystic fibrosis exacerbation (CMS-HCC).    Cystic fibrosis exacerbation: Patient presented to the ED with worsening fatigue, dyspnea and increased sputum production in the setting of recent rhinovirus for which he had been started on a course of PO Levaquin and 1 month of inhaled colistin. On admission,  RVP negative, CF sputum culture negative.  Chest x-ray with streaky right lower lung opacity, likely atelectasis. Pt reports breathing is improving.   -Pulmonology following  -Continue IV Zosyn, anticipate a 2-week course with final duration based on inflammatory markers and clinical response (6/27-7/10)  -Continue inhaled colistin x28 days (6/15-7/12)  -albuterol nebs, 7% hypertonic saline, chest vest 4x daily for airway clearance  -Continue home Trikafta, Pulmozyme, Breo Ellipta  -Follow-up PFTs ordered 6/28  -Weekly CRP/ESR, CBC w/ diff, CMP  ??  CF related sinus disease: Repeat CT sinus this admission showed near complete opacification of the bilateral maxillary sinuses, although the ostiomeatal units are patent bilaterally.   -ENT following, recs as follows:  - Nasal saline irrigations BID starting POD1. Can use a 60 mL Toomey syringe filled with sterile saline. Lean over the sink and irrigate each nostril. If not tolerating can do nasal saline sprays (3-4 sprays in each nare QID)  - Finger cots and Doyle splints will be removed POD3  - Hold anticoagulation until POD5, please continue to monitor inpatient until anticoagulation is started  -S/P revision sinus surgery  6/30  - will need at least 5 days of antibiotics post-operatively  - see below for heme recs for anticoagulation  -Continue home Flonase, Claritin, Singulair  -Acute pain post op, not improved with current plan. Give one time IV dilaudid per pt request, then change oxycodone to 5-10 mg q4 PRN   ??  CF related diabetes, poorly-controlled: A1c 11.3 on admission, up from 10.6 in March.  Uses an insulin pump at home but didn't bring it with him. During last hospitalization received Lantus 45 units nightly and lispro 20 units TID AC.  Per endocrinology, secondary to current insulin requirement, anticipate patient will need subcutaneous insulin with pens at discharge.  -Appreciate endocrinology consultation  -Continue previous regimen of Lantus 45 U at bedtime, lispro 20 U TID AC, SSI   -Outpatient endocrinology follow-up  ??  History of DVT:  Hematology consulted given upcoming surgery in setting of unclear clotting history. They recommend transitioning to prophylactic anticoagulation with outpatient follow up for long-term anticoagulation planning.   - prophylactic enoxaparin; hold day of surgery, 6/30, and for 5 days after surgery (resume 6/6)  - when resuming anticoagulation will do so at prophylactic dosing with rivoroxiban 10 mg daily (was previously on 20 mg daily). Per ENT, prefer this starts while inpt   -Outpatient hematology follow-up  ??  CF related pancreatic insufficiency:  -Continue home Creon  ??  Mood disorder:   -Continue home fluoxetine, amitriptyline, lamotrigine, trazodone  ??  Hypertension: Blood pressure currently stable.  -Continue home lisinopril  ??  GERD:  -Continue formulary equivalent of home PPI  ??  Restless leg syndrome:   -Continue home Mirapex  ??  Chronic back pain: Patient reports he is no longer taking gabapentin.  ??  Elevated liver enzymes (resolved): Has had mild elevations in the past which may be related to fatty liver disease. LFTs currently stable.  ??  Dispo: It uncertain if patient may be a candidate for home IV antibiotics; will need to reassess later in his stay after OR.  Please see the note from Dr. [name], primary pulmonologist, dated 6/27 for details of care coordination prior to consideration of home infusion.  ??  I personally spent??25 minutes face-to-face and non-face-to-face in the care of this patient, which includes all pre, intra, and post visit time on the date of service. ??All documented time was specific to the E/M visit and does not include any procedures that may have been performed.     ___________________________________________________________________    Subjective:  Pain is intense today, after surgery yesterday. Requests dilaudid to help get pain under control. 10 mg oxycodone was helpful overnight.   Reports breathing is actually doing pretty good now. Only concern at the moment is pain.     General ROS: negative     Labs/Studies:  Labs and Studies from the last 24hrs per EMR and Reviewed    Objective:  Temp:  [36.1 ??C (97 ??F)-36.9 ??C (98.4 ??F)] 36.9 ??C (98.4 ??F)  Heart Rate:  [88-100] 98  SpO2 Pulse:  [90-100] 96  Resp:  [15-25] 18  BP: (113-132)/(71-90) 118/72  FiO2 (%):  [28 %] 28 %  SpO2:  [90 %-99 %] 92 %    GEN: NAD, lying in bed  EYES: EOMI  ENT: MMM, cots in nose  CV: RRR  PULM: CTA B  ABD: soft, NT/ND, +BS  EXT: No edema

## 2022-02-10 NOTE — Unmapped (Signed)
Otolaryngology Treatment Plan Note    In brief, the patient is a 36 year old male with history of asthma, diabetes, hypertension,??cystic fibrosis and chronic rhinosinusitis who was transferred for admission due to cystic fibrosis exacerbation now s/p revision FESS on 02/09/2022    - Recommend continued IV antibiotics  - Nasal saline irrigations BID starting POD1. Can use a 60 mL Toomey syringe filled with sterile saline. Lean over the sink and irrigate each nostri  - Finger cots and Doyle splints will be removed POD3  - Ideally wait until POD5 to start anticoagulation and monitor inpatient once anticoagulation is started  - We will continue to follow  - Please reach out to the ENT team with questions, concerns, or changes in clinical status

## 2022-02-10 NOTE — Unmapped (Signed)
Endocrine Team Follow Up Consult Note     Requesting Attending Physician : Christian Erb, FNP  Service Requesting Consult : Med Bernita Raisin [facility])  Primary Care Provider: Hal Morales, NP    IMPRESSION:  Christian Young is a 36 y.o. male admitted for CF exacerbation and revision of sinus surgery. We have been consulted at the request of Christian Erb, FNP to evaluate Christian Young for hyperglycemia.     MEDICAL DECISION MAKING   Diagnoses:  1.CFRD. Uncontrolled. With severe hyperglycemia last 24 hours.  2. Nutrition: Complicating glycemic control. Increasing risk for both hypoglycemia and hyperglycemia.  3. Infection. Complicating glycemic control and increasing risk for hyperglycemia.  4. Obesity. Complicating glycemic control and increasing risk for hyperglycemia.   5. Steroids. Complicating glycemic control and increasing risk for hyperglycemia.      Data reviewed 02/10/22:  ??? Diagnostic studies and interpretation:    o  POCT glucose - severe hyperglycemia     ??? Notes reviewed: Primary team, Pulmonology and nursing notes    Overall impression based on above reviews and history:  -got only half dose of Lantus last night despite being postop with diet but did get full mealtime dose   -did receive 8 mg dex on 6/30 in OR   -anticipate pt will need increase prandial insulin in light of steroids so will strengthen correctional factor today to aid in adjusting mealtime insulin tomorrow     RECOMMENDATIONS:   -give Lantus 22u now   - continue Lantus 45u at bedtime   -50% if NPO   -continue lispro 20u TID AC   -strengthen lispro correctional TG 140, ISF 20  - Hypoglycemia protocol  - POCT-BG achs.  - Ensure patient is on glucose precautions.     Discharge Planning:   -will message schedulers closer to discharge for follow-up with both [facility] Endocrinologist and CDE  -pt will likely need subcutaneous insulin (with insulin pens) at discharge as his current insulin requirements are too great for omnipod pods   -can consider alternative pump vs concentrated insulin for Omnipod once seen in Endocrine clinic     Thank you for this consult. Discussed plan with primary team. We will continue to follow and make recommendations and place orders as appropriate.    Please page with questions or concerns: Endocrine Fellow: [phone]  DCT on call from 6AM - 3PM on weekdays then endocrine fellow on call: [phone] from 3PM - 6AM on weekdays and on weekends and holidays.   If APP cannot be reached, please page the endocrine fellow on call.      Subjective:  Interval hx: Today 02/10/22, states he is doing well today after surgery. He state he is tolerating a diet well without nausea and ate a full breakfast.     Initial H&P:    Christian Young is a 36 y.o. male with PMH significant for  cystic fibrosis, essential hypertension, depressive disorder, diabetes related to cystic fibrosis, cystic fibrosis related sinus disease, pancreatic insufficiency due to cystic fibrosis, and history of DVT  who is admitted for CF exacerbation.     Pt states that he usually uses an omnipod, however, due to his insulin requirements, the pods are only lasting a day and a half. He reports that his settings have not changed since he was last seen in clinic last summer.     Diabetes History:  Patient has a history of Cystic fibrosis related diabetes diagnosed ~2017.  Diabetes is managed by: Christian [facility]  Endocrine) - last appt July 2022.  Current home diabetes regimen: Omnipod with Dexcom (settings below).      Basal rates- 12am-12am 1.5 units/hr   TBD =36 units  Insulin to carbohydrate ratios:  1: 4.5 g carbs  Insulin sensitivity factor:  1: 25 mg/dl   Active insulin duration = 4 hours  BG targets: 130 mg/dL  Correct above 161 mg/dl   Current home blood glucose monitoring: dexcom.  Hypoglycemia awareness: yes.  Complications related to diabetes: none known    Current Nutrition:  Active Orders   Diet    Nutrition Therapy Consistent Carb; Consistent Carb 60/60/60 (4/4/4)         ROS: As per HPI.    ??? acetaminophen  650 mg Oral Q6H   ??? albuterol  2.5 mg Nebulization 4x Daily (RT)   ??? amitriptyline  100 mg Oral Nightly   ??? atorvastatin  40 mg Oral Nightly   ??? colistimetate (COLISTIN) inhalation  150 mg Inhalation Q12H [facility]   ??? dornase alfa  2.5 mg Inhalation Daily   ??? elexacaftor-tezacaftor-ivacaft  2 tablet Oral Daily    And   ??? elexacaftor-tezacaftor-ivacaft  1 tablet Oral Q PM   ??? FLUoxetine  60 mg Oral Daily   ??? fluticasone furoate-vilanteroL  1 puff Inhalation Daily (RT)   ??? fluticasone propionate  2 spray Each Nare Daily   ??? insulin glargine  22 Units Subcutaneous Nightly   ??? insulin lispro  0-20 Units Subcutaneous ACHS   ??? insulin lispro  20 Units Subcutaneous 3xd Meals   ??? lamoTRIgine  400 mg Oral Daily   ??? lipase-protease-amylase  8 capsule Oral 3xd Meals   ??? lisinopriL  10 mg Oral Daily   ??? loratadine  10 mg Oral Daily   ??? montelukast  10 mg Oral Nightly   ??? pantoprazole  20 mg Oral Daily   ??? MVW Complete (pediatric multivit 61-D3-vit K)  2 capsule Oral Daily   ??? piperacillin-tazobactam (ZOSYN) IV (intermittent)  4.5 g Intravenous Q6H   ??? polyethylene glycol  17 g Oral Daily   ??? pramipexole  0.25 mg Oral Nightly   ??? sod chlor-bicarb-squeez bottle  1 packet Each Nare BID   ??? sodium chloride 7%  4 mL Nebulization 4x Daily (RT)   ??? traZODone  150 mg Oral Nightly       Current Outpatient Medications   Medication Instructions   ??? acetaminophen (TYLENOL) 500 mg, Oral, Every 6 hours PRN   ??? albuterol (PROVENTIL HFA;VENTOLIN HFA) 90 mcg/actuation inhaler 2 puffs, Inhalation, Every 6 hours PRN   ??? albuterol 2.5 mg, Nebulization, 2 times a day   ??? alcohol swabs (ALCOHOL PREP PADS) PadM Use as directed with inhaled antibiotics   ??? amitriptyline (ELAVIL) 100 MG tablet TAKE ONE TABLET BY MOUTH AT BEDTIME   ??? atorvastatin (LIPITOR) 40 mg, Oral, Nightly   ??? blood sugar diagnostic (GLUCOSE BLOOD) Strp Other, 3 times a day (with meals), Rx sent to [facility] 01/04/20    ??? blood-glucose meter kit Dispense meter that is preferred by patient's insurance company   ??? blood-glucose meter,continuous (DEXCOM G6 RECEIVER) Misc Use as directed   ??? blood-glucose sensor (DEXCOM G6 SENSOR) Devi Use sq as directed every 10 days   ??? blood-glucose transmitter (DEXCOM G6 TRANSMITTER) Devi Use as directed, replace every 90 days   ??? budesonide-formoteroL (SYMBICORT) 160-4.5 mcg/actuation inhaler 2 puffs, Inhalation, 2 times a day (standard)   ??? colistimethate (COLYMYCIN) 150 mg injection Inhale 2  mL (150 mg total) Two (2) times a day, alternating 28 days on and 28 days off. Inject 2mL SWFI into colistin vial & gently mix, then withdraw 2mL (150mg ) for dose.   ??? colistimethate (COLYMYCIN) 150 mg injection Inhale 2 mL (150 mg total) Two (2) times a day, alternating 28 days on and 28 days off. Inject 2mL SWFI into colistin vial & gently mix, then withdraw 2mL (150mg ) for dose.   ??? EASY TOUCH LANCING DEVICE Misc Use as directed.   ??? EASY TOUCH TWIST LANCETS 30 gauge Misc Use as directed.   ??? elexacaftor-tezacaftor-ivacaft (TRIKAFTA) 100-50-75 mg(d) /150 mg (n) tablet Take 2 Tablets (orange) by mouth in the morning and 1 tablet (blue) in the evening with fatty food   ??? empty container Misc Use as directed to dispose of syringes   ??? FLUoxetine 60 mg, Oral, Daily (standard)   ??? fluticasone propionate (FLONASE) 50 mcg/actuation nasal spray 2 sprays, Each Nare, Daily (standard)   ??? FREESTYLE LIBRE 14 DAY SENSOR kit Use 3-4 times daily   ??? gabapentin (NEURONTIN) 600 mg, Oral, 3 times a day (standard)   ??? insulin ASPART (NOVOLOG U-100 INSULIN ASPART) 100 unit/mL injection Subcutaneously infuse up to 150 units daily via insulin pump   ??? insulin pump cart,automated,BT (OMNIPOD 5 G6 PODS, GEN 5,) Crtg 1 each, subcutaneous (via wearable injector), Every 3 days, Change every 72 hour   ??? lamoTRIgine (LAMICTAL) 400 mg, Oral, Daily (standard)   ??? levocetirizine (XYZAL) 5 mg, Oral, Every evening   ??? lipase-protease-amylase (CREON) 36,000-114,000- 180,000 unit CpDR Take 8 caps by mouth with meals three times daily and 4 caps with snacks up to three times a day.   ??? lisinopriL (PRINIVIL,ZESTRIL) 10 mg, Oral, Daily (standard)   ??? montelukast (SINGULAIR) 10 mg, Oral, Nightly   ??? MVW COMPLETE FORMUL PROBIOTIC 40 billion cell -15 mg CpDR 1 capsule, Oral, Daily (standard)   ??? MVW Complete, pediatric multivit 61-D3-vit K, (MVW COMPLETE FORMULATION) 1,500-800 unit-mcg cap 2 capsules, Oral, 2 times a day (standard)   ??? nebulizers (LC PLUS) Misc use as directed with nebulized medications twice daily.   ??? nebulizers (PARI LC D NEBULIZER) Misc Use with inhaled medication.   ??? nebulizers Misc Use as directed with inhaled medications   ??? omeprazole (PRILOSEC) 20 mg, Oral, Daily (standard)   ??? pen needle, diabetic (BD ULTRA-FINE NANO PEN NEEDLE) 32 gauge x 5/32 (4 mm) Ndle use up to 4 times daily   ??? pramipexole (MIRAPEX) 0.25 mg, Oral, Nightly   ??? PULMOZYME 2.5 mg, Inhalation, Daily (standard)   ??? sodium chloride 7% 7 % Nebu 4 mL, Nebulization, 2 times a day   ??? sterile water Soln Use 2 mL to mix colistin, then add another 1 mL SWFI into neb cup along with mixed colistin.   ??? syringe with needle (BD LUER-LOK SYRINGE) 3 mL 21 gauge x 1 Syrg For use with inhaled antibiotic   ??? tamsulosin (FLOMAX) 0.4 mg, Oral, Daily PRN, For kidney stone   ??? traZODone (DESYREL) 150 mg, Oral, Nightly   ??? XARELTO 20 mg, Oral, Daily (standard)         Past medical, surgical, social, and family hx reviewed in chart.     OBJECTIVE:  BP 114/75  - Pulse 88  - Temp 36.9 ??C (98.4 ??F) (Axillary)  - Resp 18  - Ht 182.9 cm (6' 0.01)  - Wt (!) 107 kg (235 lb 14.3 oz)  - SpO2 94%  - BMI  31.99 kg/m??   Wt Readings from Last 12 Encounters:   02/07/22 (!) 107 kg (235 lb 14.3 oz)   01/25/22 (!) 107.5 kg (237 lb)   01/25/22 (!) 107.5 kg (237 lb)   11/03/21 (!) 108.2 kg (238 lb 8.6 oz)   09/09/21 (!) 110.8 kg (244 lb 4.3 oz)   07/30/21 (!) 111.1 kg (245 lb)   06/08/21 (!) 113.4 kg (250 lb) 03/14/21 (!) 111.2 kg (245 lb 1.6 oz)   03/09/21 (!) 109 kg (240 lb 3.2 oz)   02/06/21 (!) 103.4 kg (228 lb)   11/24/20 (!) 101.3 kg (223 lb 4.8 oz)   06/28/20 (!) 105.8 kg (233 lb 4.8 oz)       General: Well-appearing. No acute distress. Very pleasant. Body mass index is 31.99 kg/m??.   HEENT: Normocephalic, atraumatic. EOMI. Postsurgical changes to nose with associated swelling.  Neck: No stridor.    Respiratory: No tachypnea.   Abdominal: Non-distended.    Neuro: Awake, alert, and oriented

## 2022-02-10 NOTE — Unmapped (Signed)
Pt remains on 4ONC on room air.He received all scheduled breathing treatments. Pt self-administers airway clearance (BID) with his own chest vest. BBS diminished and clear with VSS. 1200 breathing treatments given inadvertently by RN due to RN unaware that RT will do all treatments throughout the day. RN gave medications properly and used new neb for 1200 administration. Neb was discarded by RT with new neb used for 1600 treatment.

## 2022-02-10 NOTE — Unmapped (Signed)
Otolaryngology Post [facility] Day: 5  Day of Surgery    Assessment/Plan:  36 year old male with history of asthma, diabetes, hypertension, cystic fibrosis and chronic rhinosinusitis who was transferred for admission due to cystic fibrosis exacerbation, now s/p revision FESS 6/30    - Recommend continued IV antibiotics  - Nasal saline irrigations BID starting POD1. Can use a 60 mL Toomey syringe filled with sterile saline. Lean over the sink and irrigate each nostri  - Finger cots and Doyle splints will be removed POD3  - Ideally wait until POD5 to start anticoagulation and monitor inpatient once anticoagulation is started  - We will continue to follow  - Please reach out to the ENT team with questions, concerns, or changes in clinical status    Subjective:    Interval History: Post-op check. Doing well. No salty or metallic taste. No posterior oropharyngeal drainage. Vision exam stable.     Objective:    Vital signs in last 24 hours:  Temp:  [36.1 ??C (97 ??F)-36.9 ??C (98.4 ??F)] 36.9 ??C (98.4 ??F)  Heart Rate:  [73-100] 97  SpO2 Pulse:  [90-100] 96  Resp:  [15-25] 25  BP: (101-132)/(68-90) 123/89  MAP (mmHg):  [78-100] 98  FiO2 (%):  [28 %] 28 %  SpO2:  [90 %-100 %] 96 %    Intake/Output last 3 shifts:  I/O last 3 completed shifts:  In: 1550 [P.O.:490; I.V.:1010; IV Piggyback:50]  Out: 100 [Blood:100]  Intake/Output this shift:  No intake/output data recorded.    Physical Exam:  General: Awake, alert, oriented sitting upright in bed in NAD.   Head and Face: NCAT; HB 1/6; Fingercots in place  Eyes: EOMI - sclera and conjunctiva clear; able to finger count appropriately  OC/OP: clear - FOM soft and tongue protrudes midline  Neck: soft, flat  Pulm: Unlabored - no stridor/stertor

## 2022-02-11 MED ADMIN — oxyCODONE (ROXICODONE) immediate release tablet 10 mg: 10 mg | ORAL | @ 14:00:00 | Stop: 2022-02-24

## 2022-02-11 MED ADMIN — pramipexole (MIRAPEX) tablet 0.25 mg: .25 mg | ORAL

## 2022-02-11 MED ADMIN — lamoTRIgine (LaMICtal) tablet 400 mg: 400 mg | ORAL | @ 14:00:00

## 2022-02-11 MED ADMIN — oxyCODONE (ROXICODONE) immediate release tablet 10 mg: 10 mg | ORAL | Stop: 2022-02-24

## 2022-02-11 MED ADMIN — lisinopriL (PRINIVIL,ZESTRIL) tablet 10 mg: 10 mg | ORAL | @ 14:00:00

## 2022-02-11 MED ADMIN — albuterol 2.5 mg /3 mL (0.083 %) nebulizer solution 2.5 mg: 2.5 mg | RESPIRATORY_TRACT | @ 22:00:00

## 2022-02-11 MED ADMIN — insulin lispro (HumaLOG) injection 25 Units: 25 [IU] | SUBCUTANEOUS | @ 17:00:00

## 2022-02-11 MED ADMIN — fluticasone furoate-vilanteroL (BREO ELLIPTA) 200-25 mcg/dose inhaler 1 puff: 1 | RESPIRATORY_TRACT | @ 13:00:00

## 2022-02-11 MED ADMIN — sod chlor-bicarb-squeez bottle (NEILMED) nasal packet 1 packet: 1 | NASAL | @ 01:00:00 | Stop: 2022-02-13

## 2022-02-11 MED ADMIN — FLUoxetine (PROzac) capsule 60 mg: 60 mg | ORAL | @ 14:00:00

## 2022-02-11 MED ADMIN — amitriptyline (ELAVIL) tablet 100 mg: 100 mg | ORAL

## 2022-02-11 MED ADMIN — elexacaftor-tezacaftor-ivacaft (TRIKAFTA) tablet 2 tablet **Patient Supplied**: 2 | ORAL | @ 14:00:00

## 2022-02-11 MED ADMIN — sodium chloride 7% NEBULIZER solution 4 mL: 4 mL | RESPIRATORY_TRACT | @ 22:00:00

## 2022-02-11 MED ADMIN — insulin lispro (HumaLOG) injection 0-20 Units: 0-20 [IU] | SUBCUTANEOUS | @ 14:00:00

## 2022-02-11 MED ADMIN — pediatric multivitamin-vit D3 1,500 unit-vit K 800 mcg (MVW COMPLETE FORMULATION) capsule: 2 | ORAL | @ 14:00:00

## 2022-02-11 MED ADMIN — lipase-protease-amylase (CREON) 36,000-114,000- 180,000 unit capsule 288,000 units of lipase - Patient Supplied: 8 | ORAL | @ 22:00:00

## 2022-02-11 MED ADMIN — insulin lispro (HumaLOG) injection 25 Units: 25 [IU] | SUBCUTANEOUS | @ 22:00:00

## 2022-02-11 MED ADMIN — piperacillin-tazobactam (ZOSYN) IVPB (premix) 4.5 g: 4.5 g | INTRAVENOUS | Stop: 2022-02-20

## 2022-02-11 MED ADMIN — insulin lispro (HumaLOG) injection 0-20 Units: 0-20 [IU] | SUBCUTANEOUS | @ 17:00:00

## 2022-02-11 MED ADMIN — dornase alfa (PULMOZYME) 1 mg/mL solution 2.5 mg: 2.5 mg | RESPIRATORY_TRACT | @ 13:00:00

## 2022-02-11 MED ADMIN — albuterol 2.5 mg /3 mL (0.083 %) nebulizer solution 2.5 mg: 2.5 mg | RESPIRATORY_TRACT | @ 13:00:00

## 2022-02-11 MED ADMIN — acetaminophen (TYLENOL) tablet 650 mg: 650 mg | ORAL | @ 17:00:00 | Stop: 2022-02-11

## 2022-02-11 MED ADMIN — sodium chloride 7% NEBULIZER solution 4 mL: 4 mL | RESPIRATORY_TRACT | @ 17:00:00

## 2022-02-11 MED ADMIN — sodium chloride 7% NEBULIZER solution 4 mL: 4 mL | RESPIRATORY_TRACT | @ 13:00:00

## 2022-02-11 MED ADMIN — atorvastatin (LIPITOR) tablet 40 mg: 40 mg | ORAL

## 2022-02-11 MED ADMIN — fluticasone propionate (FLONASE) 50 mcg/actuation nasal spray 2 spray: 2 | NASAL | @ 14:00:00

## 2022-02-11 MED ADMIN — polyethylene glycol (MIRALAX) packet 17 g: 17 g | ORAL | @ 14:00:00

## 2022-02-11 MED ADMIN — lipase-protease-amylase (CREON) 36,000-114,000- 180,000 unit capsule 288,000 units of lipase - Patient Supplied: 8 | ORAL | @ 14:00:00

## 2022-02-11 MED ADMIN — lipase-protease-amylase (CREON) 36,000-114,000- 180,000 unit capsule 288,000 units of lipase - Patient Supplied: 8 | ORAL | @ 17:00:00

## 2022-02-11 MED ADMIN — acetaminophen (TYLENOL) tablet 650 mg: 650 mg | ORAL | @ 11:00:00 | Stop: 2022-02-11

## 2022-02-11 MED ADMIN — sod chlor-bicarb-squeez bottle (NEILMED) nasal packet 1 packet: 1 | NASAL | @ 14:00:00 | Stop: 2022-02-13

## 2022-02-11 MED ADMIN — piperacillin-tazobactam (ZOSYN) IVPB (premix) 4.5 g: 4.5 g | INTRAVENOUS | @ 19:00:00 | Stop: 2022-02-20

## 2022-02-11 MED ADMIN — sodium chloride 7% NEBULIZER solution 4 mL: 4 mL | RESPIRATORY_TRACT

## 2022-02-11 MED ADMIN — albuterol 2.5 mg /3 mL (0.083 %) nebulizer solution 2.5 mg: 2.5 mg | RESPIRATORY_TRACT | @ 17:00:00

## 2022-02-11 MED ADMIN — insulin lispro (HumaLOG) injection 25 Units: 25 [IU] | SUBCUTANEOUS | @ 14:00:00

## 2022-02-11 MED ADMIN — insulin lispro (HumaLOG) injection 0-20 Units: 0-20 [IU] | SUBCUTANEOUS | @ 22:00:00

## 2022-02-11 MED ADMIN — colistimethate (COLYMYCIN) 150 mg in sodium chloride (NS) 4 mL inhalation: 150 mg | RESPIRATORY_TRACT | @ 13:00:00

## 2022-02-11 MED ADMIN — insulin glargine (LANTUS) injection 45 Units: 45 [IU] | SUBCUTANEOUS | @ 02:00:00

## 2022-02-11 MED ADMIN — montelukast (SINGULAIR) tablet 10 mg: 10 mg | ORAL

## 2022-02-11 MED ADMIN — traZODone (DESYREL) tablet 150 mg: 150 mg | ORAL

## 2022-02-11 MED ADMIN — elexacaftor-tezacaftor-ivacaft (TRIKAFTA) tablet 1 tablet **Patient Supplied**: 1 | ORAL | @ 22:00:00

## 2022-02-11 MED ADMIN — piperacillin-tazobactam (ZOSYN) IVPB (premix) 4.5 g: 4.5 g | INTRAVENOUS | @ 08:00:00 | Stop: 2022-02-20

## 2022-02-11 MED ADMIN — piperacillin-tazobactam (ZOSYN) IVPB (premix) 4.5 g: 4.5 g | INTRAVENOUS | @ 14:00:00 | Stop: 2022-02-20

## 2022-02-11 MED ADMIN — insulin lispro (HumaLOG) injection 0-20 Units: 0-20 [IU] | SUBCUTANEOUS | @ 02:00:00

## 2022-02-11 MED ADMIN — pantoprazole (PROTONIX) EC tablet 20 mg: 20 mg | ORAL | @ 14:00:00

## 2022-02-11 MED ADMIN — colistimethate (COLYMYCIN) 150 mg in sodium chloride (NS) 4 mL inhalation: 150 mg | RESPIRATORY_TRACT

## 2022-02-11 MED ADMIN — loratadine (CLARITIN) tablet 10 mg: 10 mg | ORAL | @ 14:00:00

## 2022-02-11 MED ADMIN — albuterol 2.5 mg /3 mL (0.083 %) nebulizer solution 2.5 mg: 2.5 mg | RESPIRATORY_TRACT

## 2022-02-11 NOTE — Unmapped (Signed)
Otolaryngology Daily Progress Note    Assessment/Plan:  36 year old male with history of asthma, diabetes, hypertension, cystic fibrosis and chronic rhinosinusitis who was transferred for admission due to cystic fibrosis exacerbation, now s/p revision FESS 6/30    - Recommend continued IV antibiotics  - Nasal saline irrigations BID starting POD1. Can use a 60 mL Toomey syringe filled with sterile saline. Lean over the sink and irrigate each nostril. If not tolerating can do nasal saline sprays (3-4 sprays in each nare QID)  - Finger cots and Doyle splints will be removed POD3  - Hold anticoagulation until POD5, please continue to monitor inpatient until anticoagulation is started  - We will continue to follow  - Please reach out to the ENT team with questions, concerns, or changes in clinical status    Subjective:  NAEON. Pain controlled.  Doing well. No salty or metallic taste. No posterior oropharyngeal drainage. Vision exam stable. Patient states that he had some nasal obstructive symptoms with nasal irrigation. He will re-attempt later this morning.     Objective:    Vital signs in last 24 hours:  Temp:  [36 ??C (96.8 ??F)-36.9 ??C (98.4 ??F)] 36 ??C (96.8 ??F)  Heart Rate:  [85-98] 85  Resp:  [18] 18  BP: (113-134)/(72-97) 134/97  MAP (mmHg):  [85-109] 109  SpO2:  [92 %-97 %] 97 %    Intake/Output last 3 shifts:  No intake/output data recorded.  Intake/Output this shift:  No intake/output data recorded.    Physical Exam:  General: Awake, alert, oriented sitting upright in bed in NAD.   Head and Face: NCAT; HB 1/6; Fingercots in place bilaterally taped to nasal dorsum  Eyes: EOMI - sclera and conjunctiva clear; able to finger count appropriately  OC/OP: clear - FOM soft and tongue protrudes midline  Neck: soft, flat  Pulm: Unlabored - no stridor/stertor

## 2022-02-11 NOTE — Unmapped (Signed)
VSS and afebrile. Blood glucose monitored and corrected. 1x dilaudid given for pain and PRN oxy given as available. Pt D1 post op. Droplet/contact precautions maintained. IV ABX continued. Nebs given as scheduled by RT. No acute events.     Problem: Adult Inpatient Plan of Care  Goal: Plan of Care Review  Outcome: Progressing  Goal: Patient-Specific Goal (Individualized)  Outcome: Progressing  Goal: Absence of Hospital-Acquired Illness or Injury  Outcome: Progressing  Intervention: Identify and Manage Fall Risk  Recent Flowsheet Documentation  Taken 02/10/2022 1350 by Vaughan Browner, RN  Safety Interventions: family at bedside  Taken 02/10/2022 1138 by Vaughan Browner, RN  Safety Interventions:   lighting adjusted for tasks/safety   low bed   family at bedside  Taken 02/10/2022 0905 by Vaughan Browner, RN  Safety Interventions:   environmental modification   fall reduction program maintained   family at bedside   lighting adjusted for tasks/safety   low bed  Intervention: Prevent Skin Injury  Recent Flowsheet Documentation  Taken 02/10/2022 1139 by Vaughan Browner, RN  Skin Protection: adhesive use limited  Taken 02/10/2022 0905 by Vaughan Browner, RN  Skin Protection: adhesive use limited  Intervention: Prevent and Manage VTE (Venous Thromboembolism) Risk  Recent Flowsheet Documentation  Taken 02/10/2022 0905 by Vaughan Browner, RN  Activity Management: activity adjusted per tolerance  Intervention: Prevent Infection  Recent Flowsheet Documentation  Taken 02/10/2022 0905 by Vaughan Browner, RN  Infection Prevention:   rest/sleep promoted   hand hygiene promoted  Goal: Optimal Comfort and Wellbeing  Outcome: Progressing  Goal: Readiness for Transition of Care  Outcome: Progressing  Goal: Rounds/Family Conference  Outcome: Progressing

## 2022-02-11 NOTE — Unmapped (Signed)
Pt alert x oriented, afebrile, VSS. Pt given prn Oxy 10 mg once. Pt did do nasal rinse as scheduled and felt increased discomfort and pressure afterwards (felt saline did not come all the way out). MD notified. Pt was given ice packs to place around sinuses and reported it did help. Pt had no other complaints overnight. Pt with family at bedside. Will continue to monitor pt status.

## 2022-02-11 NOTE — Unmapped (Signed)
Hospital Medicine Daily Progress Note    Assessment/Plan:    Principal Problem:    Cystic fibrosis exacerbation (CMS-HCC)  Active Problems:    Essential hypertension    Depressive disorder    Mood disorder (CMS-HCC)    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)    Chronic pansinusitis    Pancreatic insufficiency due to cystic fibrosis (CMS-HCC)    Bronchiectasis (CMS-HCC)    Chronic deep vein thrombosis (DVT) of lower extremity (CMS-HCC)  Resolved Problems:    * No resolved hospital problems. *                 Christian Young is a 36 y.o. male that presented to [facility] with Cystic fibrosis exacerbation (CMS-HCC).    Cystic fibrosis exacerbation: Patient presented to the ED with worsening fatigue, dyspnea and increased sputum production in the setting of recent rhinovirus for which he had been started on a course of PO Levaquin and 1 month of inhaled colistin. On admission,  RVP negative, CF sputum culture negative.  Chest x-ray with streaky right lower lung opacity, likely atelectasis. Pt reports breathing is improving.   -Pulmonology following  -Continue IV Zosyn, anticipate a 2-week course with final duration based on inflammatory markers and clinical response (6/27-7/10)  -Continue inhaled colistin x28 days (6/15-7/12)  -albuterol nebs, 7% hypertonic saline, chest vest 4x daily for airway clearance  -Continue home Trikafta, Pulmozyme, Breo Ellipta  -Follow-up PFTs ordered 6/28  -Weekly CRP/ESR, CBC w/ diff, CMP     CF related sinus disease: Repeat CT sinus this admission showed near complete opacification of the bilateral maxillary sinuses, although the ostiomeatal units are patent bilaterally.   -ENT following, recs as follows:  - Nasal saline irrigations BID starting POD1. Can use a 60 mL Toomey syringe filled with sterile saline. Lean over the sink and irrigate each nostril. If not tolerating can do nasal saline sprays (3-4 sprays in each nare QID)  - Finger cots and Doyle splints will be removed POD3  - Hold anticoagulation until POD5, please continue to monitor inpatient until anticoagulation is started  -S/P revision sinus surgery  6/30  - will need at least 5 days of antibiotics post-operatively  - see below for heme recs for anticoagulation  -Continue home Flonase, Claritin, Singulair  -Acute pain post op, managed with current regimen of 5-10 mg oxycodone q4 PRN     CF related diabetes, poorly-controlled: A1c 11.3 on admission, up from 10.6 in March.  Uses an insulin pump at home but didn't bring it with him. During last hospitalization received Lantus 45 units nightly and lispro 20 units TID AC.  Per endocrinology, secondary to current insulin requirement, anticipate patient will need subcutaneous insulin with pens at discharge.  -Appreciate endocrinology consultation  -Continue previous regimen of Lantus 45 U at bedtime, increase lispro 25 U TID AC, SSI   -Outpatient endocrinology follow-up     History of DVT:  Hematology consulted given upcoming surgery in setting of unclear clotting history. They recommend transitioning to prophylactic anticoagulation with outpatient follow up for long-term anticoagulation planning.   - prophylactic enoxaparin; hold day of surgery, 6/30, and for 5 days after surgery (resume 6/6)  - when resuming anticoagulation will do so at prophylactic dosing with rivoroxiban 10 mg daily (was previously on 20 mg daily). Per ENT, prefer this starts while inpt   -Outpatient hematology follow-up     CF related pancreatic insufficiency:  -Continue home Creon     Mood disorder:   -  Continue home fluoxetine, amitriptyline, lamotrigine, trazodone     Hypertension: Blood pressure currently stable.  -Continue home lisinopril     GERD:  -Continue formulary equivalent of home PPI     Restless leg syndrome:   -Continue home Mirapex     Chronic back pain: Patient reports he is no longer taking gabapentin.     Elevated liver enzymes (resolved): Has had mild elevations in the past which may be related to fatty liver disease. LFTs currently stable.     Dispo: It uncertain if patient may be a candidate for home IV antibiotics; will need to reassess later in his stay after OR.  Please see the note from Dr. [name], primary pulmonologist, dated 6/27 for details of care coordination prior to consideration of home infusion.     I personally spent 35 minutes face-to-face and non-face-to-face in the care of this patient, which includes all pre, intra, and post visit time on the date of service.  All documented time was specific to the E/M visit and does not include any procedures that may have been performed.     ___________________________________________________________________    Subjective:  Doing well today. Pain much better controlled. Small amount of blood from nose overnight. Packing still in place. Occurred and resolved spontaneously in sleep.   Feels that lungs are nearly back to baseline.     General ROS: negative     Labs/Studies:  Labs and Studies from the last 24hrs per EMR and Reviewed    Objective:  Temp:  [36 ??C (96.8 ??F)-37.1 ??C (98.8 ??F)] 37.1 ??C (98.8 ??F)  Heart Rate:  [85-96] 95  Resp:  [18-20] 20  BP: (119-134)/(75-97) 121/85  SpO2:  [97 %-99 %] 99 %    GEN: NAD, lying in bed  EYES: EOMI  ENT: MMM, nasal packing in place  CV: RRR  PULM: CTA B  ABD: soft, NT/ND, +BS  EXT: No edema

## 2022-02-11 NOTE — Unmapped (Signed)
Endocrine Team Follow Up Consult Note     Requesting Attending Physician : Caren Hazy, PA  Service Requesting Consult : Med Bernita Raisin [facility])  Primary Care Provider: Hal Morales, NP    IMPRESSION:  Christian Young is a 36 y.o. male admitted for CF exacerbation and revision of sinus surgery. We have been consulted at the request of [name], PA to evaluate Christian Young for hyperglycemia.     MEDICAL DECISION MAKING   Diagnoses:  1.CFRD. Uncontrolled. With hyperglycemia.  2. Nutrition: Complicating glycemic control. Increasing risk for both hypoglycemia and hyperglycemia.  3. Infection. Complicating glycemic control and increasing risk for hyperglycemia.  4. Obesity. Complicating glycemic control and increasing risk for hyperglycemia.   5. Steroids. Complicating glycemic control and increasing risk for hyperglycemia.    Data reviewed 02/11/22:  ??? Diagnostic studies and interpretation:    o  POCT glucose - moderate hyperglycemia     ??? Notes reviewed: Primary team, Pulmonology, nursing notes and ENT    Overall impression based on above reviews and history:  -will increase prandial insulin to 25u AC due to pt requiring this dose yesterday with still some hyperglycemia with return to near inpatient goals by evening; fasting sugar this AM at inpatient goals     RECOMMENDATIONS:   - continue Lantus 45u at bedtime   -50% if NPO   -increase to lispro 25u TID AC   -continue lispro correctional TG 140, ISF 20  - Hypoglycemia protocol  - POCT-BG achs.  - Ensure patient is on glucose precautions.     Discharge Planning:   -will message schedulers closer to discharge for follow-up with both [facility] Endocrinologist and CDE  -pt will likely need subcutaneous insulin (with insulin pens) at discharge as his current insulin requirements are too great for omnipod pods   -can consider alternative pump vs concentrated insulin for Omnipod once seen in Endocrine clinic     Thank you for this consult. Discussed plan with primary team. We will continue to follow and make recommendations and place orders as appropriate.    Please page with questions or concerns: Endocrine Fellow: [phone]  DCT on call from 6AM - 3PM on weekdays then endocrine fellow on call: [phone] from 3PM - 6AM on weekdays and on weekends and holidays.   If APP cannot be reached, please page the endocrine fellow on call.      Subjective:  Interval hx: Today 02/11/22,standing at bedside. States he did not sleep well due to needing to remain upright. Reports some epistaxis. Appetite remains good.     Initial H&P:    Christian Young is a 36 y.o. male with PMH significant for  cystic fibrosis, essential hypertension, depressive disorder, diabetes related to cystic fibrosis, cystic fibrosis related sinus disease, pancreatic insufficiency due to cystic fibrosis, and history of DVT  who is admitted for CF exacerbation.     Pt states that he usually uses an omnipod, however, due to his insulin requirements, the pods are only lasting a day and a half. He reports that his settings have not changed since he was last seen in clinic last summer.     Diabetes History:  Patient has a history of Cystic fibrosis related diabetes diagnosed ~2017.  Diabetes is managed by: Dr. Lenon Curt [facility] Endocrine) - last appt July 2022.  Current home diabetes regimen: Omnipod with Dexcom (settings below).      Basal rates- 12am-12am 1.5 units/hr   TBD =36 units  Insulin to  carbohydrate ratios:  1: 4.5 g carbs  Insulin sensitivity factor:  1: 25 mg/dl   Active insulin duration = 4 hours  BG targets: 130 mg/dL  Correct above 811 mg/dl   Current home blood glucose monitoring: dexcom.  Hypoglycemia awareness: yes.  Complications related to diabetes: none known    Current Nutrition:  Active Orders   Diet    Nutrition Therapy Regular/House         ROS: As per HPI.    ??? acetaminophen  650 mg Oral Q6H   ??? albuterol  2.5 mg Nebulization 4x Daily (RT)   ??? amitriptyline  100 mg Oral Nightly   ??? atorvastatin  40 mg Oral Nightly   ??? colistimetate (COLISTIN) inhalation  150 mg Inhalation Q12H [facility]   ??? dornase alfa  2.5 mg Inhalation Daily   ??? elexacaftor-tezacaftor-ivacaft  2 tablet Oral Daily    And   ??? elexacaftor-tezacaftor-ivacaft  1 tablet Oral Q PM   ??? FLUoxetine  60 mg Oral Daily   ??? fluticasone furoate-vilanteroL  1 puff Inhalation Daily (RT)   ??? fluticasone propionate  2 spray Each Nare Daily   ??? insulin glargine  45 Units Subcutaneous Nightly   ??? insulin lispro  0-20 Units Subcutaneous ACHS   ??? insulin lispro  20 Units Subcutaneous 3xd Meals   ??? lamoTRIgine  400 mg Oral Daily   ??? lipase-protease-amylase  8 capsule Oral 3xd Meals   ??? lisinopriL  10 mg Oral Daily   ??? loratadine  10 mg Oral Daily   ??? montelukast  10 mg Oral Nightly   ??? pantoprazole  20 mg Oral Daily   ??? MVW Complete (pediatric multivit 61-D3-vit K)  2 capsule Oral Daily   ??? piperacillin-tazobactam (ZOSYN) IV (intermittent)  4.5 g Intravenous Q6H   ??? polyethylene glycol  17 g Oral Daily   ??? pramipexole  0.25 mg Oral Nightly   ??? sod chlor-bicarb-squeez bottle  1 packet Each Nare BID   ??? sodium chloride 7%  4 mL Nebulization 4x Daily (RT)   ??? traZODone  150 mg Oral Nightly       Current Outpatient Medications   Medication Instructions   ??? acetaminophen (TYLENOL) 500 mg, Oral, Every 6 hours PRN   ??? albuterol (PROVENTIL HFA;VENTOLIN HFA) 90 mcg/actuation inhaler 2 puffs, Inhalation, Every 6 hours PRN   ??? albuterol 2.5 mg, Nebulization, 2 times a day   ??? alcohol swabs (ALCOHOL PREP PADS) PadM Use as directed with inhaled antibiotics   ??? amitriptyline (ELAVIL) 100 MG tablet TAKE ONE TABLET BY MOUTH AT BEDTIME   ??? atorvastatin (LIPITOR) 40 mg, Oral, Nightly   ??? blood sugar diagnostic (GLUCOSE BLOOD) Strp Other, 3 times a day (with meals), Rx sent to [facility] 01/04/20    ??? blood-glucose meter kit Dispense meter that is preferred by patient's insurance company   ??? blood-glucose meter,continuous (DEXCOM G6 RECEIVER) Misc Use as directed   ??? blood-glucose sensor (DEXCOM G6 SENSOR) Devi Use sq as directed every 10 days   ??? blood-glucose transmitter (DEXCOM G6 TRANSMITTER) Devi Use as directed, replace every 90 days   ??? budesonide-formoteroL (SYMBICORT) 160-4.5 mcg/actuation inhaler 2 puffs, Inhalation, 2 times a day (standard)   ??? colistimethate (COLYMYCIN) 150 mg injection Inhale 2 mL (150 mg total) Two (2) times a day, alternating 28 days on and 28 days off. Inject 2mL SWFI into colistin vial & gently mix, then withdraw 2mL (150mg ) for dose.   ??? colistimethate (COLYMYCIN) 150 mg  injection Inhale 2 mL (150 mg total) Two (2) times a day, alternating 28 days on and 28 days off. Inject 2mL SWFI into colistin vial & gently mix, then withdraw 2mL (150mg ) for dose.   ??? EASY TOUCH LANCING DEVICE Misc Use as directed.   ??? EASY TOUCH TWIST LANCETS 30 gauge Misc Use as directed.   ??? elexacaftor-tezacaftor-ivacaft (TRIKAFTA) 100-50-75 mg(d) /150 mg (n) tablet Take 2 Tablets (orange) by mouth in the morning and 1 tablet (blue) in the evening with fatty food   ??? empty container Misc Use as directed to dispose of syringes   ??? FLUoxetine 60 mg, Oral, Daily (standard)   ??? fluticasone propionate (FLONASE) 50 mcg/actuation nasal spray 2 sprays, Each Nare, Daily (standard)   ??? FREESTYLE LIBRE 14 DAY SENSOR kit Use 3-4 times daily   ??? gabapentin (NEURONTIN) 600 mg, Oral, 3 times a day (standard)   ??? insulin ASPART (NOVOLOG U-100 INSULIN ASPART) 100 unit/mL injection Subcutaneously infuse up to 150 units daily via insulin pump   ??? insulin pump cart,automated,BT (OMNIPOD 5 G6 PODS, GEN 5,) Crtg 1 each, subcutaneous (via wearable injector), Every 3 days, Change every 72 hour   ??? lamoTRIgine (LAMICTAL) 400 mg, Oral, Daily (standard)   ??? levocetirizine (XYZAL) 5 mg, Oral, Every evening   ??? lipase-protease-amylase (CREON) 36,000-114,000- 180,000 unit CpDR Take 8 caps by mouth with meals three times daily and 4 caps with snacks up to three times a day.   ??? lisinopriL (PRINIVIL,ZESTRIL) 10 mg, Oral, Daily (standard)   ??? montelukast (SINGULAIR) 10 mg, Oral, Nightly   ??? MVW COMPLETE FORMUL PROBIOTIC 40 billion cell -15 mg CpDR 1 capsule, Oral, Daily (standard)   ??? MVW Complete, pediatric multivit 61-D3-vit K, (MVW COMPLETE FORMULATION) 1,500-800 unit-mcg cap 2 capsules, Oral, 2 times a day (standard)   ??? nebulizers (LC PLUS) Misc use as directed with nebulized medications twice daily.   ??? nebulizers (PARI LC D NEBULIZER) Misc Use with inhaled medication.   ??? nebulizers Misc Use as directed with inhaled medications   ??? omeprazole (PRILOSEC) 20 mg, Oral, Daily (standard)   ??? pen needle, diabetic (BD ULTRA-FINE NANO PEN NEEDLE) 32 gauge x 5/32 (4 mm) Ndle use up to 4 times daily   ??? pramipexole (MIRAPEX) 0.25 mg, Oral, Nightly   ??? PULMOZYME 2.5 mg, Inhalation, Daily (standard)   ??? sodium chloride 7% 7 % Nebu 4 mL, Nebulization, 2 times a day   ??? sterile water Soln Use 2 mL to mix colistin, then add another 1 mL SWFI into neb cup along with mixed colistin.   ??? syringe with needle (BD LUER-LOK SYRINGE) 3 mL 21 gauge x 1 Syrg For use with inhaled antibiotic   ??? tamsulosin (FLOMAX) 0.4 mg, Oral, Daily PRN, For kidney stone   ??? traZODone (DESYREL) 150 mg, Oral, Nightly   ??? XARELTO 20 mg, Oral, Daily (standard)         Past medical, surgical, social, and family hx reviewed in chart.     OBJECTIVE:  BP 134/97  - Pulse 85  - Temp 36 ??C (96.8 ??F) (Axillary)  - Resp 18  - Ht 182.9 cm (6' 0.01)  - Wt (!) 107 kg (235 lb 14.3 oz)  - SpO2 97%  - BMI 31.99 kg/m??   Wt Readings from Last 12 Encounters:   02/07/22 (!) 107 kg (235 lb 14.3 oz)   01/25/22 (!) 107.5 kg (237 lb)   01/25/22 (!) 107.5 kg (237 lb)   11/03/21 Marland Kitchen)  108.2 kg (238 lb 8.6 oz)   09/09/21 (!) 110.8 kg (244 lb 4.3 oz)   07/30/21 (!) 111.1 kg (245 lb)   06/08/21 (!) 113.4 kg (250 lb)   03/14/21 (!) 111.2 kg (245 lb 1.6 oz)   03/09/21 (!) 109 kg (240 lb 3.2 oz)   02/06/21 (!) 103.4 kg (228 lb)   11/24/20 (!) 101.3 kg (223 lb 4.8 oz) 06/28/20 (!) 105.8 kg (233 lb 4.8 oz)       General: Well-appearing. No acute distress. Very pleasant. Body mass index is 31.99 kg/m??.   HEENT: Normocephalic, atraumatic. EOMI. Postsurgical changes to nose with associated swelling.  Neck: No stridor.    Respiratory: No tachypnea.   Abdominal: Non-distended.    Neuro: Awake, alert, and oriented

## 2022-02-12 LAB — COMPREHENSIVE METABOLIC PANEL
ALBUMIN: 3.6 g/dL (ref 3.4–5.0)
ALKALINE PHOSPHATASE: 80 U/L (ref 46–116)
ALT (SGPT): 53 U/L — ABNORMAL HIGH (ref 10–49)
ANION GAP: 7 mmol/L (ref 5–14)
AST (SGOT): 27 U/L (ref <=34)
BILIRUBIN TOTAL: 0.3 mg/dL (ref 0.3–1.2)
BLOOD UREA NITROGEN: 7 mg/dL — ABNORMAL LOW (ref 9–23)
BUN / CREAT RATIO: 11
CALCIUM: 8.6 mg/dL — ABNORMAL LOW (ref 8.7–10.4)
CHLORIDE: 105 mmol/L (ref 98–107)
CO2: 28 mmol/L (ref 20.0–31.0)
CREATININE: 0.65 mg/dL
EGFR CKD-EPI (2021) MALE: >90 mL/min/{1.73_m2} (ref >=60)
GLUCOSE RANDOM: 195 mg/dL — ABNORMAL HIGH (ref 70–179)
POTASSIUM: 3.8 mmol/L (ref 3.4–4.8)
PROTEIN TOTAL: 6.4 g/dL (ref 5.7–8.2)
SODIUM: 140 mmol/L (ref 135–145)

## 2022-02-12 LAB — CBC W/ AUTO DIFF
BASOPHILS ABSOLUTE COUNT: 0 10*9/L (ref 0.0–0.1)
BASOPHILS RELATIVE PERCENT: 0.3 %
EOSINOPHILS ABSOLUTE COUNT: 0.3 10*9/L (ref 0.0–0.5)
EOSINOPHILS RELATIVE PERCENT: 4.8 %
HEMATOCRIT: 34.6 % — ABNORMAL LOW (ref 39.0–48.0)
HEMOGLOBIN: 11.6 g/dL — ABNORMAL LOW (ref 12.9–16.5)
LYMPHOCYTES ABSOLUTE COUNT: 1.7 10*9/L (ref 1.1–3.6)
LYMPHOCYTES RELATIVE PERCENT: 23.9 %
MEAN CORPUSCULAR HEMOGLOBIN CONC: 33.5 g/dL (ref 32.0–36.0)
MEAN CORPUSCULAR HEMOGLOBIN: 27.3 pg (ref 25.9–32.4)
MEAN CORPUSCULAR VOLUME: 81.4 fL (ref 77.6–95.7)
MEAN PLATELET VOLUME: 8.1 fL (ref 6.8–10.7)
MONOCYTES ABSOLUTE COUNT: 0.7 10*9/L (ref 0.3–0.8)
MONOCYTES RELATIVE PERCENT: 9.8 %
NEUTROPHILS ABSOLUTE COUNT: 4.3 10*9/L (ref 1.8–7.8)
NEUTROPHILS RELATIVE PERCENT: 61.2 %
PLATELET COUNT: 252 10*9/L (ref 150–450)
RED BLOOD CELL COUNT: 4.25 10*12/L — ABNORMAL LOW (ref 4.26–5.60)
RED CELL DISTRIBUTION WIDTH: 16 % — ABNORMAL HIGH (ref 12.2–15.2)
WBC ADJUSTED: 7.1 10*9/L (ref 3.6–11.2)

## 2022-02-12 LAB — C-REACTIVE PROTEIN: C-REACTIVE PROTEIN: 7 mg/L (ref <=10.0)

## 2022-02-12 LAB — MAGNESIUM: MAGNESIUM: 1.8 mg/dL (ref 1.6–2.6)

## 2022-02-12 LAB — SEDIMENTATION RATE: ERYTHROCYTE SEDIMENTATION RATE: 31 mm/h — ABNORMAL HIGH (ref 0–15)

## 2022-02-12 MED ADMIN — insulin lispro (HumaLOG) injection 0-20 Units: 0-20 [IU] | SUBCUTANEOUS | @ 13:00:00

## 2022-02-12 MED ADMIN — elexacaftor-tezacaftor-ivacaft (TRIKAFTA) tablet 2 tablet **Patient Supplied**: 2 | ORAL | @ 13:00:00

## 2022-02-12 MED ADMIN — piperacillin-tazobactam (ZOSYN) IVPB (premix) 4.5 g: 4.5 g | INTRAVENOUS | @ 13:00:00 | Stop: 2022-02-20

## 2022-02-12 MED ADMIN — piperacillin-tazobactam (ZOSYN) IVPB (premix) 4.5 g: 4.5 g | INTRAVENOUS | @ 01:00:00 | Stop: 2022-02-20

## 2022-02-12 MED ADMIN — dornase alfa (PULMOZYME) 1 mg/mL solution 2.5 mg: 2.5 mg | RESPIRATORY_TRACT | @ 12:00:00

## 2022-02-12 MED ADMIN — lipase-protease-amylase (CREON) 36,000-114,000- 180,000 unit capsule 288,000 units of lipase - Patient Supplied: 8 | ORAL | @ 16:00:00

## 2022-02-12 MED ADMIN — lisinopriL (PRINIVIL,ZESTRIL) tablet 10 mg: 10 mg | ORAL | @ 13:00:00

## 2022-02-12 MED ADMIN — insulin lispro (HumaLOG) injection 0-20 Units: 0-20 [IU] | SUBCUTANEOUS | @ 16:00:00

## 2022-02-12 MED ADMIN — sodium chloride 7% NEBULIZER solution 4 mL: 4 mL | RESPIRATORY_TRACT | @ 12:00:00

## 2022-02-12 MED ADMIN — insulin lispro (HumaLOG) injection 0-20 Units: 0-20 [IU] | SUBCUTANEOUS | @ 02:00:00

## 2022-02-12 MED ADMIN — loratadine (CLARITIN) tablet 10 mg: 10 mg | ORAL | @ 13:00:00

## 2022-02-12 MED ADMIN — fluticasone furoate-vilanteroL (BREO ELLIPTA) 200-25 mcg/dose inhaler 1 puff: 1 | RESPIRATORY_TRACT | @ 12:00:00

## 2022-02-12 MED ADMIN — atorvastatin (LIPITOR) tablet 40 mg: 40 mg | ORAL | @ 01:00:00

## 2022-02-12 MED ADMIN — colistimethate (COLYMYCIN) 150 mg in sodium chloride (NS) 4 mL inhalation: 150 mg | RESPIRATORY_TRACT | @ 12:00:00

## 2022-02-12 MED ADMIN — pediatric multivitamin-vit D3 1,500 unit-vit K 800 mcg (MVW COMPLETE FORMULATION) capsule: 2 | ORAL | @ 13:00:00

## 2022-02-12 MED ADMIN — sodium chloride 7% NEBULIZER solution 4 mL: 4 mL | RESPIRATORY_TRACT | @ 16:00:00

## 2022-02-12 MED ADMIN — traZODone (DESYREL) tablet 150 mg: 150 mg | ORAL | @ 01:00:00

## 2022-02-12 MED ADMIN — pantoprazole (PROTONIX) EC tablet 20 mg: 20 mg | ORAL | @ 13:00:00

## 2022-02-12 MED ADMIN — insulin glargine (LANTUS) injection 45 Units: 45 [IU] | SUBCUTANEOUS | @ 02:00:00

## 2022-02-12 MED ADMIN — FLUoxetine (PROzac) capsule 60 mg: 60 mg | ORAL | @ 13:00:00

## 2022-02-12 MED ADMIN — piperacillin-tazobactam (ZOSYN) IVPB (premix) 4.5 g: 4.5 g | INTRAVENOUS | @ 08:00:00 | Stop: 2022-02-20

## 2022-02-12 MED ADMIN — sodium chloride 7% NEBULIZER solution 4 mL: 4 mL | RESPIRATORY_TRACT | @ 20:00:00

## 2022-02-12 MED ADMIN — lamoTRIgine (LaMICtal) tablet 400 mg: 400 mg | ORAL | @ 13:00:00

## 2022-02-12 MED ADMIN — albuterol 2.5 mg /3 mL (0.083 %) nebulizer solution 2.5 mg: 2.5 mg | RESPIRATORY_TRACT | @ 01:00:00

## 2022-02-12 MED ADMIN — albuterol 2.5 mg /3 mL (0.083 %) nebulizer solution 2.5 mg: 2.5 mg | RESPIRATORY_TRACT | @ 20:00:00

## 2022-02-12 MED ADMIN — insulin lispro (HumaLOG) injection 0-20 Units: 0-20 [IU] | SUBCUTANEOUS | @ 22:00:00

## 2022-02-12 MED ADMIN — sodium chloride 7% NEBULIZER solution 4 mL: 4 mL | RESPIRATORY_TRACT | @ 01:00:00

## 2022-02-12 MED ADMIN — polyethylene glycol (MIRALAX) packet 17 g: 17 g | ORAL | @ 13:00:00

## 2022-02-12 MED ADMIN — sod chlor-bicarb-squeez bottle (NEILMED) nasal packet 1 packet: 1 | NASAL | @ 13:00:00 | Stop: 2022-02-13

## 2022-02-12 MED ADMIN — amitriptyline (ELAVIL) tablet 100 mg: 100 mg | ORAL | @ 01:00:00

## 2022-02-12 MED ADMIN — insulin lispro (HumaLOG) injection 30 Units: 30 [IU] | SUBCUTANEOUS | @ 22:00:00

## 2022-02-12 MED ADMIN — insulin lispro (HumaLOG) injection 30 Units: 30 [IU] | SUBCUTANEOUS | @ 13:00:00

## 2022-02-12 MED ADMIN — lipase-protease-amylase (CREON) 36,000-114,000- 180,000 unit capsule 288,000 units of lipase - Patient Supplied: 8 | ORAL | @ 13:00:00

## 2022-02-12 MED ADMIN — fluticasone propionate (FLONASE) 50 mcg/actuation nasal spray 2 spray: 2 | NASAL | @ 13:00:00

## 2022-02-12 MED ADMIN — oxyCODONE (ROXICODONE) immediate release tablet 10 mg: 10 mg | ORAL | @ 01:00:00 | Stop: 2022-02-24

## 2022-02-12 MED ADMIN — oxyCODONE (ROXICODONE) immediate release tablet 10 mg: 10 mg | ORAL | @ 21:00:00 | Stop: 2022-02-24

## 2022-02-12 MED ADMIN — oxyCODONE (ROXICODONE) immediate release tablet 10 mg: 10 mg | ORAL | @ 17:00:00 | Stop: 2022-02-24

## 2022-02-12 MED ADMIN — pramipexole (MIRAPEX) tablet 0.25 mg: .25 mg | ORAL | @ 01:00:00

## 2022-02-12 MED ADMIN — oxyCODONE (ROXICODONE) immediate release tablet 10 mg: 10 mg | ORAL | @ 08:00:00 | Stop: 2022-02-24

## 2022-02-12 MED ADMIN — lipase-protease-amylase (CREON) 36,000-114,000- 180,000 unit capsule 288,000 units of lipase - Patient Supplied: 8 | ORAL | @ 21:00:00

## 2022-02-12 MED ADMIN — piperacillin-tazobactam (ZOSYN) IVPB (premix) 4.5 g: 4.5 g | INTRAVENOUS | @ 18:00:00 | Stop: 2022-02-20

## 2022-02-12 MED ADMIN — albuterol 2.5 mg /3 mL (0.083 %) nebulizer solution 2.5 mg: 2.5 mg | RESPIRATORY_TRACT | @ 12:00:00

## 2022-02-12 MED ADMIN — montelukast (SINGULAIR) tablet 10 mg: 10 mg | ORAL | @ 01:00:00

## 2022-02-12 MED ADMIN — colistimethate (COLYMYCIN) 150 mg in sodium chloride (NS) 4 mL inhalation: 150 mg | RESPIRATORY_TRACT | @ 01:00:00

## 2022-02-12 MED ADMIN — insulin lispro (HumaLOG) injection 30 Units: 30 [IU] | SUBCUTANEOUS | @ 16:00:00

## 2022-02-12 MED ADMIN — elexacaftor-tezacaftor-ivacaft (TRIKAFTA) tablet 1 tablet **Patient Supplied**: 1 | ORAL | @ 21:00:00

## 2022-02-12 MED ADMIN — albuterol 2.5 mg /3 mL (0.083 %) nebulizer solution 2.5 mg: 2.5 mg | RESPIRATORY_TRACT | @ 16:00:00

## 2022-02-12 MED ADMIN — oxyCODONE (ROXICODONE) immediate release tablet 10 mg: 10 mg | ORAL | @ 13:00:00 | Stop: 2022-02-24

## 2022-02-12 NOTE — Unmapped (Signed)
Pt has been alert x oriented, afebrile, VSS. Pt has requested prn 10mg   Oxy twice this shift (for pain related to nasal procedure). Pt continues on IV ABX. Pt without any other complaints. Will continue to monitor pt status.

## 2022-02-12 NOTE — Unmapped (Signed)
Hospital Medicine Daily Progress Note    Assessment/Plan:    Principal Problem:    Cystic fibrosis exacerbation (CMS-HCC)  Active Problems:    Essential hypertension    Depressive disorder    Mood disorder (CMS-HCC)    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)    Chronic pansinusitis    Pancreatic insufficiency due to cystic fibrosis (CMS-HCC)    Bronchiectasis (CMS-HCC)    Chronic deep vein thrombosis (DVT) of lower extremity (CMS-HCC)  Resolved Problems:    * No resolved hospital problems. *                 Christian Young is a 36 y.o. male that presented to [facility] with Cystic fibrosis exacerbation (CMS-HCC).    Cystic fibrosis exacerbation: Patient presented to the ED with worsening fatigue, dyspnea and increased sputum production in the setting of recent rhinovirus for which he had been started on a course of PO Levaquin and 1 month of inhaled colistin. On admission, ??RVP negative, CF sputum culture negative. ??Chest x-ray with streaky right lower lung opacity, likely atelectasis.??Pt reports breathing is improving.??  -Pulmonology??following  -Continue IV Zosyn, anticipate a 2-week course with final duration based on inflammatory markers and clinical response (6/27-7/10)  -Continue inhaled colistin x28 days (6/15-7/12)  -albuterol nebs, 7% hypertonic saline, chest vest 4x daily for airway clearance  -Continue home Trikafta, Pulmozyme, Breo Ellipta  -Follow-up PFTs ordered 6/28  -Weekly CRP/ESR, CBC w/ diff, CMP  ??  CF related sinus disease: Repeat CT sinus this admission showed near complete opacification of the bilateral maxillary sinuses, although the ostiomeatal units are patent bilaterally.??  -ENT following, recs as follows:  - Nasal saline irrigations BID starting POD1. Can use a 60 mL Toomey syringe filled with sterile saline. Lean over the sink and irrigate each nostril. If not tolerating can do nasal saline sprays (3-4 sprays in each nare QID)  - Finger cots and Doyle splints will be removed POD3  -??Hold anticoagulation??until POD5, please continue to??monitor inpatient until??anticoagulation is started  -S/P revision sinus surgery ??6/30  - will need at least 5 days of antibiotics post-operatively  - see below for heme recs for anticoagulation  -Continue home Flonase, Claritin, Singulair  -Acute pain post op, managed with current regimen of 5-10 mg oxycodone q4 PRN  ??  CF related diabetes, poorly-controlled: A1c 11.3 on admission, up from 10.6 in March. ??Uses an insulin pump at home but didn't bring it with him. During last hospitalization received Lantus 45 units nightly and lispro 20 units TID AC.????Per endocrinology, secondary to current insulin requirement, anticipate patient will need subcutaneous insulin with pens at discharge.  -Appreciate endocrinology consultation  -Continue previous regimen of Lantus 45 U at bedtime, increase lispro 30 U TID AC, SSI??  -Outpatient endocrinology follow-up  ??  History of DVT: ??Hematology consulted given upcoming surgery in setting of unclear clotting history. They recommend transitioning to prophylactic anticoagulation with outpatient follow up for long-term anticoagulation planning.   - prophylactic enoxaparin; hold day of surgery, 6/30,??and for 5 days after surgery (resume 6/6), observe for 1 day after starting, potential dc 6/7 pending pulm recs  - when resuming anticoagulation will do so at prophylactic dosing with rivoroxiban 10 mg daily (was previously on 20 mg daily). Per ENT, prefer this starts while inpt??  -Outpatient hematology follow-up  ??  CF related pancreatic insufficiency:  -Continue home Creon  ??  Mood disorder:   -Continue home fluoxetine, amitriptyline, lamotrigine, trazodone  ??  Hypertension:??Blood  pressure currently stable.  -Continue home lisinopril  ??  GERD:  -Continue formulary equivalent of home PPI  ??  Restless leg syndrome:   -Continue home Mirapex  ??  Chronic back pain: Patient reports he is no longer taking gabapentin.  ??  Elevated liver enzymes (resolved): Has had mild elevations in the past which may be related to fatty liver disease. LFTs??currently stable.  ??  Dispo: It uncertain if patient may be a candidate for home IV antibiotics; will need to reassess later in his stay after OR. ??Please see the note from Dr. [name], primary pulmonologist, dated 6/27 for details of care coordination prior to consideration of home infusion.  ??  I personally spent??35??minutes face-to-face and non-face-to-face in the care of this patient, which includes all pre, intra, and post visit time on the date of service. ??All documented time was specific to the E/M visit and does not include any procedures that may have been performed.??  ___________________________________________________________________    Subjective:  Doing well. Packing out of nose today. Pain well managed. Breathing is back to baseline.     General ROS: negative     Labs/Studies:  Labs and Studies from the last 24hrs per EMR and Reviewed    Objective:  Temp:  [36.4 ??C (97.5 ??F)-36.8 ??C (98.2 ??F)] 36.6 ??C (97.9 ??F)  Heart Rate:  [88-106] 97  Resp:  [18-22] 18  BP: (121-141)/(88-95) 141/95  SpO2:  [94 %-100 %] 94 %    GEN: NAD, lying in bed  EYES: EOMI  ENT: MMM  CV: RRR  PULM: CTA B  ABD: soft, NT/ND, +BS  EXT: No edema

## 2022-02-12 NOTE — Unmapped (Signed)
Pulmonary Consult Service  Follow-Up Note      Primary Service:   Hospitalist- [place]  Primary Service Attending:  Caren Hazy, PA  Reason for Consult:   Cystic fibrosis    IMPRESSION and RECOMMENDATIONS     Christian Young is a 36 y.o. male with a history of cystic fibrosis (S945L and [phone] insertion) on elexacaftor/tezacaftor/ivacaftor with pulmonary, sinus, pancreatic, and gastrointestinal manifestations who presents with a cystic fibrosis flare and sinus infection. Follows w/ [facility]. PFTs with a drop in his FEV1 of over 10%.  We recommend treating this with antibiotics and airway clearance. He may repeat pulmonary function tests at any point; given symptomatic improvement, we will repeat them on     #Cystic fibrosis exacerbation  - Continue IV piperacillin/tazobactam (06/15- ), duration to be determined  - Continue inhaled colistin (06/15- ) for planned 28-day course  - Continue airway clearance with 7% hypertonic saline and albuterol pretreatment 4 times daily; continue mechanical airway clearance with chest vest or any device at patient's preference  -Trend CRP and ESR weekly  - f/u sinus cultures (NGTD)  - Prior CF cultures w/ mucoid & smooth PSA, +carbapenem & fluoroquinolone resistance  - Anticipate repeat PFTs later this week (approx. 07/05, order placed); discussed with ENT    #Cystic fibrosis-related sinus disease  - S/p sinus surgery with ENT on 06/30    #Cystic fibrosis-related pancreatic insufficiency diabetes  - Agree with endocrine consult    #Cystic fibrosis-related constipation  - Agree with bowel regimen    Mr. Kneisel was seen, examined and discussed with  Dr. Kerrie Buffalo  Thank you for involving [name] in his care. We look forward to following with you.  Please don't hesitate to page [name] with any questions or concerns at [phone] (pulmonary consult fellow).    Hilda Blades, MD  [facility] Pulmonary & Critical Care Fellow, PGY-4  02/12/2022    SUBJECTIVE:     Feels much better.    Medications:     Scheduled Meds:   albuterol  2.5 mg Nebulization 4x Daily (RT)    amitriptyline  100 mg Oral Nightly    atorvastatin  40 mg Oral Nightly    colistimetate (COLISTIN) inhalation  150 mg Inhalation Q12H SCH    dornase alfa  2.5 mg Inhalation Daily    elexacaftor-tezacaftor-ivacaft  2 tablet Oral Daily    And    elexacaftor-tezacaftor-ivacaft  1 tablet Oral Q PM    FLUoxetine  60 mg Oral Daily    fluticasone furoate-vilanteroL  1 puff Inhalation Daily (RT)    fluticasone propionate  2 spray Each Nare Daily    insulin glargine  45 Units Subcutaneous Nightly    insulin lispro  0-20 Units Subcutaneous ACHS    insulin lispro  30 Units Subcutaneous 3xd Meals    lamoTRIgine  400 mg Oral Daily    lipase-protease-amylase  8 capsule Oral 3xd Meals    lisinopriL  10 mg Oral Daily    loratadine  10 mg Oral Daily    montelukast  10 mg Oral Nightly    pantoprazole  20 mg Oral Daily    MVW Complete (pediatric multivit 61-D3-vit K)  2 capsule Oral Daily    piperacillin-tazobactam (ZOSYN) IV (intermittent)  4.5 g Intravenous Q6H    polyethylene glycol  17 g Oral Daily    pramipexole  0.25 mg Oral Nightly    sod chlor-bicarb-squeez bottle  1 packet Each Nare BID    sodium chloride 7%  4 mL Nebulization 4x Daily (RT)    traZODone  150 mg Oral Nightly     Continuous Infusions:  PRN Meds:.aluminum-magnesium hydroxide-simethicone, dextrose, glucagon, glucose, guaiFENesin, oxyCODONE **OR** oxyCODONE, pancrelipase (Lip-Prot-Amyl), senna     PHYSICAL EXAM:   BP 141/95  - Pulse 97  - Temp 36.6 ??C (97.9 ??F) (Oral)  - Resp 18  - Ht 182.9 cm (6' 0.01)  - Wt (!) 107 kg (235 lb 14.3 oz)  - SpO2 94%  - BMI 31.99 kg/m??   General: Alert and oriented, no acute distress  HEENT: MMM, nasal packing in place w/ sutures taped to bridge of nose  CV: RRR, no m/r/g  Lungs: CTAB, no increased work of breathing  Abd: Soft, NT, ND, no rebound or guarding  Ext: Warm, well perfused, no peripheral edema  Skin: No rashes  Neuro: No focal deficits    LABORATORY and RADIOLOGY DATA:     Pertinent Laboratory Data from the last 24 hours:  Lab Results   Component Value Date    WBC 7.1 02/12/2022    HGB 11.6 (L) 02/12/2022    HCT 34.6 (L) 02/12/2022    PLT 252 02/12/2022     Lab Results   Component Value Date    NA 140 02/12/2022    K 3.8 02/12/2022    CL 105 02/12/2022    CO2 28.0 02/12/2022    BUN 7 (L) 02/12/2022    CREATININE 0.65 02/12/2022    GLU 195 (H) 02/12/2022    CALCIUM 8.6 (L) 02/12/2022    MG 1.8 02/12/2022    PHOS 3.9 02/06/2022       Lab Results   Component Value Date    BILITOT 0.3 02/12/2022    BILIDIR 0.20 02/06/2022    PROT 6.4 02/12/2022    ALBUMIN 3.6 02/12/2022    ALT 53 (H) 02/12/2022    AST 27 02/12/2022    ALKPHOS 80 02/12/2022       Lab Results   Component Value Date    LABPROT 11.8 01/18/2020    INR 1.06 02/07/2022    APTT 31.6 11/03/2021       Pertinent Micro Data:  Blood Culture, Routine (no units)   Date Value   02/06/2022 No Growth at 5 days   02/06/2022 No Growth at 5 days     WBC (10*9/L)   Date Value   02/12/2022 7.1          Pertinent Imaging Data from the last 24 hours:  Reviewed in [facility]

## 2022-02-12 NOTE — Unmapped (Signed)
Endocrine Team Follow Up Consult Note     Requesting Attending Physician : Christian Hazy, PA  Service Requesting Consult : Med [facility])  Primary Care Provider: Hal Morales, NP    IMPRESSION:  Christian Young is a 36 y.o. male admitted for CF exacerbation and revision of sinus surgery. We have been consulted at the request of Christian Pennie Banter, PA to evaluate Christian Young for hyperglycemia.     MEDICAL DECISION MAKING   Diagnoses:  1.CFRD. Uncontrolled. With severe hyperglycemia last 24 hours.  2. Nutrition: Complicating glycemic control. Increasing risk for both hypoglycemia and hyperglycemia.  3. Infection. Complicating glycemic control and increasing risk for hyperglycemia.  4. Obesity. Complicating glycemic control and increasing risk for hyperglycemia.   5. Steroids. Complicating glycemic control and increasing risk for hyperglycemia.    Data reviewed 02/12/22:  ??? Diagnostic studies and interpretation:    o  POCT glucose - severe hyperglycemia   o BMP - moderate hyperglycemia   o CBC - anemia     ??? Notes reviewed: Primary team, Pulmonology, nursing notes and ENT    Overall impression based on above reviews and history:  -will increase prandial insulin to 30u AC (30% increase) given fasting sugars were at goal yesterday and pt required total 91u correctional yesterday; anticipate today is last day of steroid effect     RECOMMENDATIONS:   - continue Lantus 45u at bedtime   -50% if NPO   -increase to lispro 30u TID AC   -continue lispro correctional TG 140, ISF 20  - Hypoglycemia protocol  - POCT-BG achs.  - Ensure patient is on glucose precautions.     Discharge Planning:   -will message schedulers closer to discharge for follow-up with both [facility] Endocrinologist and CDE  -pt will likely need subcutaneous insulin (with insulin pens) at discharge as his current insulin requirements are too great for omnipod pods   -can consider alternative pump vs concentrated insulin for Omnipod once seen in Endocrine clinic     Thank you for this consult. Discussed plan with primary team. We will continue to follow and make recommendations and place orders as appropriate.    Please page with questions or concerns: Endocrine Fellow: [phone]  DCT on call from 6AM - 3PM on weekdays then endocrine fellow on call: [phone] from 3PM - 6AM on weekdays and on weekends and holidays.   If APP cannot be reached, please page the endocrine fellow on call.      Subjective:  Interval hx: Today 02/12/22, pt standing at bedside. Happy that nasal dressings removed.     Initial H&P:    Christian Young is a 36 y.o. male with PMH significant for  cystic fibrosis, essential hypertension, depressive disorder, diabetes related to cystic fibrosis, cystic fibrosis related sinus disease, pancreatic insufficiency due to cystic fibrosis, and history of DVT  who is admitted for CF exacerbation.     Pt states that he usually uses an omnipod, however, due to his insulin requirements, the pods are only lasting a day and a half. He reports that his settings have not changed since he was last seen in clinic last summer.     Diabetes History:  Patient has a history of Cystic fibrosis related diabetes diagnosed ~2017.  Diabetes is managed by: Christian Young  [facility] Endocrine) - last appt July 2022.  Current home diabetes regimen: Omnipod with Dexcom (settings below).      Basal rates- 12am-12am 1.5 units/hr   TBD =  36 units  Insulin to carbohydrate ratios:  1: 4.5 g carbs  Insulin sensitivity factor:  1: 25 mg/dl   Active insulin duration = 4 hours  BG targets: 130 mg/dL  Correct above 161 mg/dl   Current home blood glucose monitoring: dexcom.  Hypoglycemia awareness: yes.  Complications related to diabetes: none known    Current Nutrition:  Active Orders   Diet    Nutrition Therapy Regular/House         ROS: As per HPI.    ??? albuterol  2.5 mg Nebulization 4x Daily (RT)   ??? amitriptyline  100 mg Oral Nightly   ??? atorvastatin  40 mg Oral Nightly   ??? colistimetate (COLISTIN) inhalation  150 mg Inhalation Q12H [facility]   ??? dornase alfa  2.5 mg Inhalation Daily   ??? elexacaftor-tezacaftor-ivacaft  2 tablet Oral Daily    And   ??? elexacaftor-tezacaftor-ivacaft  1 tablet Oral Q PM   ??? FLUoxetine  60 mg Oral Daily   ??? fluticasone furoate-vilanteroL  1 puff Inhalation Daily (RT)   ??? fluticasone propionate  2 spray Each Nare Daily   ??? insulin glargine  45 Units Subcutaneous Nightly   ??? insulin lispro  0-20 Units Subcutaneous ACHS   ??? insulin lispro  25 Units Subcutaneous 3xd Meals   ??? lamoTRIgine  400 mg Oral Daily   ??? lipase-protease-amylase  8 capsule Oral 3xd Meals   ??? lisinopriL  10 mg Oral Daily   ??? loratadine  10 mg Oral Daily   ??? montelukast  10 mg Oral Nightly   ??? pantoprazole  20 mg Oral Daily   ??? MVW Complete (pediatric multivit 61-D3-vit K)  2 capsule Oral Daily   ??? piperacillin-tazobactam (ZOSYN) IV (intermittent)  4.5 g Intravenous Q6H   ??? polyethylene glycol  17 g Oral Daily   ??? pramipexole  0.25 mg Oral Nightly   ??? sod chlor-bicarb-squeez bottle  1 packet Each Nare BID   ??? sodium chloride 7%  4 mL Nebulization 4x Daily (RT)   ??? traZODone  150 mg Oral Nightly       Current Outpatient Medications   Medication Instructions   ??? acetaminophen (TYLENOL) 500 mg, Oral, Every 6 hours PRN   ??? albuterol (PROVENTIL HFA;VENTOLIN HFA) 90 mcg/actuation inhaler 2 puffs, Inhalation, Every 6 hours PRN   ??? albuterol 2.5 mg, Nebulization, 2 times a day   ??? alcohol swabs (ALCOHOL PREP PADS) PadM Use as directed with inhaled antibiotics   ??? amitriptyline (ELAVIL) 100 MG tablet TAKE ONE TABLET BY MOUTH AT BEDTIME   ??? atorvastatin (LIPITOR) 40 mg, Oral, Nightly   ??? blood sugar diagnostic (GLUCOSE BLOOD) Strp Other, 3 times a day (with meals), Rx sent to [facility] 01/04/20    ??? blood-glucose meter kit Dispense meter that is preferred by patient's insurance company   ??? blood-glucose meter,continuous (DEXCOM G6 RECEIVER) Misc Use as directed   ??? blood-glucose sensor (DEXCOM G6 SENSOR) Devi Use sq as directed every 10 days   ??? blood-glucose transmitter (DEXCOM G6 TRANSMITTER) Devi Use as directed, replace every 90 days   ??? budesonide-formoteroL (SYMBICORT) 160-4.5 mcg/actuation inhaler 2 puffs, Inhalation, 2 times a day (standard)   ??? colistimethate (COLYMYCIN) 150 mg injection Inhale 2 mL (150 mg total) Two (2) times a day, alternating 28 days on and 28 days off. Inject 2mL SWFI into colistin vial & gently mix, then withdraw 2mL (150mg ) for dose.   ??? colistimethate (COLYMYCIN) 150 mg injection Inhale 2 mL (  150 mg total) Two (2) times a day, alternating 28 days on and 28 days off. Inject 2mL SWFI into colistin vial & gently mix, then withdraw 2mL (150mg ) for dose.   ??? EASY TOUCH LANCING DEVICE Misc Use as directed.   ??? EASY TOUCH TWIST LANCETS 30 gauge Misc Use as directed.   ??? elexacaftor-tezacaftor-ivacaft (TRIKAFTA) 100-50-75 mg(d) /150 mg (n) tablet Take 2 Tablets (orange) by mouth in the morning and 1 tablet (blue) in the evening with fatty food   ??? empty container Misc Use as directed to dispose of syringes   ??? FLUoxetine 60 mg, Oral, Daily (standard)   ??? fluticasone propionate (FLONASE) 50 mcg/actuation nasal spray 2 sprays, Each Nare, Daily (standard)   ??? FREESTYLE LIBRE 14 DAY SENSOR kit Use 3-4 times daily   ??? gabapentin (NEURONTIN) 600 mg, Oral, 3 times a day (standard)   ??? insulin ASPART (NOVOLOG U-100 INSULIN ASPART) 100 unit/mL injection Subcutaneously infuse up to 150 units daily via insulin pump   ??? insulin pump cart,automated,BT (OMNIPOD 5 G6 PODS, GEN 5,) Crtg 1 each, subcutaneous (via wearable injector), Every 3 days, Change every 72 hour   ??? lamoTRIgine (LAMICTAL) 400 mg, Oral, Daily (standard)   ??? levocetirizine (XYZAL) 5 mg, Oral, Every evening   ??? lipase-protease-amylase (CREON) 36,000-114,000- 180,000 unit CpDR Take 8 caps by mouth with meals three times daily and 4 caps with snacks up to three times a day.   ??? lisinopriL (PRINIVIL,ZESTRIL) 10 mg, Oral, Daily (standard)   ??? montelukast (SINGULAIR) 10 mg, Oral, Nightly   ??? MVW COMPLETE FORMUL PROBIOTIC 40 billion cell -15 mg CpDR 1 capsule, Oral, Daily (standard)   ??? MVW Complete, pediatric multivit 61-D3-vit K, (MVW COMPLETE FORMULATION) 1,500-800 unit-mcg cap 2 capsules, Oral, 2 times a day (standard)   ??? nebulizers (LC PLUS) Misc use as directed with nebulized medications twice daily.   ??? nebulizers (PARI LC D NEBULIZER) Misc Use with inhaled medication.   ??? nebulizers Misc Use as directed with inhaled medications   ??? omeprazole (PRILOSEC) 20 mg, Oral, Daily (standard)   ??? pen needle, diabetic (BD ULTRA-FINE NANO PEN NEEDLE) 32 gauge x 5/32 (4 mm) Ndle use up to 4 times daily   ??? pramipexole (MIRAPEX) 0.25 mg, Oral, Nightly   ??? PULMOZYME 2.5 mg, Inhalation, Daily (standard)   ??? sodium chloride 7% 7 % Nebu 4 mL, Nebulization, 2 times a day   ??? sterile water Soln Use 2 mL to mix colistin, then add another 1 mL SWFI into neb cup along with mixed colistin.   ??? syringe with needle (BD LUER-LOK SYRINGE) 3 mL 21 gauge x 1 Syrg For use with inhaled antibiotic   ??? tamsulosin (FLOMAX) 0.4 mg, Oral, Daily PRN, For kidney stone   ??? traZODone (DESYREL) 150 mg, Oral, Nightly   ??? XARELTO 20 mg, Oral, Daily (standard)         Past medical, surgical, social, and family hx reviewed in chart.     OBJECTIVE:  BP 125/90  - Pulse 88  - Temp 36.4 ??C (97.5 ??F) (Oral)  - Resp 22  - Ht 182.9 cm (6' 0.01)  - Wt (!) 107 kg (235 lb 14.3 oz)  - SpO2 94%  - BMI 31.99 kg/m??   Wt Readings from Last 12 Encounters:   02/07/22 (!) 107 kg (235 lb 14.3 oz)   01/25/22 (!) 107.5 kg (237 lb)   01/25/22 (!) 107.5 kg (237 lb)   11/03/21 (!) 108.2 kg (238  lb 8.6 oz)   09/09/21 (!) 110.8 kg (244 lb 4.3 oz)   07/30/21 (!) 111.1 kg (245 lb)   06/08/21 (!) 113.4 kg (250 lb)   03/14/21 (!) 111.2 kg (245 lb 1.6 oz)   03/09/21 (!) 109 kg (240 lb 3.2 oz)   02/06/21 (!) 103.4 kg (228 lb)   11/24/20 (!) 101.3 kg (223 lb 4.8 oz)   06/28/20 (!) 105.8 kg (233 lb 4.8 oz)       General: Well-appearing. No acute distress. Very pleasant. Body mass index is 31.99 kg/m??.   HEENT: Normocephalic, atraumatic. EOMI. Nasal surgical dressing removed.  Neck: No stridor.     Respiratory: No conversational dyspnea.   Abdominal: Non-distended.    Neuro: Awake, alert, and oriented

## 2022-02-12 NOTE — Unmapped (Addendum)
Pt VSS, afebrile, A/Ox4. Pt received breathing tx from RT. ENT came and removed packing and sutures from nasal cavity; pt with continued pain. PRN oxy given along with ice packs for pain and pressure. BG managed with meal time and SSI. Pt CHG given. Pt free of falls or injuries with all safety measures maintained.   Problem: Adult Inpatient Plan of Care  Goal: Plan of Care Review  Outcome: Ongoing - Unchanged  Goal: Patient-Specific Goal (Individualized)  Outcome: Ongoing - Unchanged  Goal: Absence of Hospital-Acquired Illness or Injury  Outcome: Ongoing - Unchanged  Intervention: Identify and Manage Fall Risk  Recent Flowsheet Documentation  Taken 02/12/2022 0720 by Jimmy Picket, RN  Safety Interventions:   low bed   lighting adjusted for tasks/safety   bed alarm   fall reduction program maintained   family at bedside   infection management   nonskid shoes/slippers when out of bed   room near unit station  Intervention: Prevent Skin Injury  Recent Flowsheet Documentation  Taken 02/12/2022 1045 by Jimmy Picket, RN  Skin Protection:   adhesive use limited   cleansing with dimethicone incontinence wipes   drying agents applied  Taken 02/12/2022 0720 by Jimmy Picket, RN  Skin Protection:   adhesive use limited   cleansing with dimethicone incontinence wipes   drying agents applied  Intervention: Prevent and Manage VTE (Venous Thromboembolism) Risk  Recent Flowsheet Documentation  Taken 02/12/2022 0720 by Jimmy Picket, RN  Activity Management: activity adjusted per tolerance  Intervention: Prevent Infection  Recent Flowsheet Documentation  Taken 02/12/2022 0720 by Jimmy Picket, RN  Infection Prevention:   hand hygiene promoted   personal protective equipment utilized   rest/sleep promoted   single patient room provided  Goal: Optimal Comfort and Wellbeing  Outcome: Ongoing - Unchanged  Goal: Readiness for Transition of Care  Outcome: Ongoing - Unchanged  Goal: Rounds/Family Conference  Outcome: Ongoing - Unchanged     Problem: Infection  Goal: Absence of Infection Signs and Symptoms  Outcome: Ongoing - Unchanged  Intervention: Prevent or Manage Infection  Recent Flowsheet Documentation  Taken 02/12/2022 0720 by Jimmy Picket, RN  Infection Management: aseptic technique maintained  Isolation Precautions:   droplet precautions maintained   contact precautions maintained     Problem: Impaired Wound Healing  Goal: Optimal Wound Healing  Outcome: Ongoing - Unchanged  Intervention: Promote Wound Healing  Recent Flowsheet Documentation  Taken 02/12/2022 0720 by Jimmy Picket, RN  Activity Management: activity adjusted per tolerance     Problem: Fall Injury Risk  Goal: Absence of Fall and Fall-Related Injury  Outcome: Ongoing - Unchanged  Intervention: Promote Injury-Free Environment  Recent Flowsheet Documentation  Taken 02/12/2022 0720 by Jimmy Picket, RN  Safety Interventions:   low bed   lighting adjusted for tasks/safety   bed alarm   fall reduction program maintained   family at bedside   infection management   nonskid shoes/slippers when out of bed   room near unit station

## 2022-02-12 NOTE — Unmapped (Signed)
Patient remains on 4 ONC on room air. Patient did well with doing all of their treatments today. Patient did their airway clearance without complications. Will continue to monitor patient.

## 2022-02-12 NOTE — Unmapped (Signed)
Otolaryngology Daily Progress Note    Assessment/Plan:  36 year old male with history of asthma, diabetes, hypertension, cystic fibrosis and chronic rhinosinusitis who was transferred for admission due to cystic fibrosis exacerbation, now s/p revision FESS 6/30    - Recommend continued IV antibiotics  - Nasal saline irrigations BID starting POD1. Can use a 60 mL [place] syringe filled with sterile saline. Lean over the sink and irrigate each nostril. If not tolerating can do nasal saline sprays (3-4 sprays in each nare QID)  - Finger cots and Doyle splints removed today  - Hold anticoagulation until POD5, please continue to monitor inpatient until anticoagulation is started  - We will continue to follow  - Please reach out to the ENT team with questions, concerns, or changes in clinical status    Subjective:  NAEON. Pain controlled.  Doing well. No salty or metallic taste. No posterior oropharyngeal drainage. Vision exam stable. Tolerated packing/splint removal well today.    Objective:    Vital signs in last 24 hours:  Temp:  [36.4 ??C (97.5 ??F)-36.8 ??C (98.2 ??F)] 36.6 ??C (97.9 ??F)  Heart Rate:  [88-106] 97  Resp:  [18-22] 18  BP: (121-141)/(88-95) 141/95  MAP (mmHg):  [91-110] 110  SpO2:  [94 %-100 %] 94 %    Intake/Output last 3 shifts:  No intake/output data recorded.  Intake/Output this shift:  No intake/output data recorded.    Physical Exam:  General: Awake, alert, oriented sitting upright in bed in NAD.   Head and Face: NCAT; HB 1/6; mild muoid drainage bilaterally, no significant bloody drainage.  Eyes: EOMI - sclera and conjunctiva clear; able to finger count appropriately  OC/OP: clear - FOM soft and tongue protrudes midline  Neck: soft, flat  Pulm: Unlabored - no stridor/stertor

## 2022-02-13 DIAGNOSIS — I8290 Acute embolism and thrombosis of unspecified vein: Principal | ICD-10-CM

## 2022-02-13 MED ADMIN — insulin lispro (HumaLOG) injection 36 Units: 36 [IU] | SUBCUTANEOUS | @ 17:00:00

## 2022-02-13 MED ADMIN — pediatric multivitamin-vit D3 1,500 unit-vit K 800 mcg (MVW COMPLETE FORMULATION) capsule: 2 | ORAL | @ 13:00:00

## 2022-02-13 MED ADMIN — sod chlor-bicarb-squeez bottle (NEILMED) nasal packet 1 packet: 1 | NASAL | @ 13:00:00 | Stop: 2022-02-13

## 2022-02-13 MED ADMIN — albuterol 2.5 mg /3 mL (0.083 %) nebulizer solution 2.5 mg: 2.5 mg | RESPIRATORY_TRACT | @ 21:00:00

## 2022-02-13 MED ADMIN — insulin lispro (HumaLOG) injection 0-20 Units: 0-20 [IU] | SUBCUTANEOUS | @ 02:00:00

## 2022-02-13 MED ADMIN — FLUoxetine (PROzac) capsule 60 mg: 60 mg | ORAL | @ 13:00:00

## 2022-02-13 MED ADMIN — albuterol 2.5 mg /3 mL (0.083 %) nebulizer solution 2.5 mg: 2.5 mg | RESPIRATORY_TRACT | @ 17:00:00

## 2022-02-13 MED ADMIN — insulin lispro (HumaLOG) injection 0-20 Units: 0-20 [IU] | SUBCUTANEOUS | @ 17:00:00

## 2022-02-13 MED ADMIN — elexacaftor-tezacaftor-ivacaft (TRIKAFTA) tablet 2 tablet **Patient Supplied**: 2 | ORAL | @ 13:00:00

## 2022-02-13 MED ADMIN — pramipexole (MIRAPEX) tablet 0.25 mg: .25 mg | ORAL | @ 01:00:00

## 2022-02-13 MED ADMIN — montelukast (SINGULAIR) tablet 10 mg: 10 mg | ORAL | @ 01:00:00

## 2022-02-13 MED ADMIN — sodium chloride 7% NEBULIZER solution 4 mL: 4 mL | RESPIRATORY_TRACT | @ 17:00:00

## 2022-02-13 MED ADMIN — oxyCODONE (ROXICODONE) immediate release tablet 10 mg: 10 mg | ORAL | @ 13:00:00 | Stop: 2022-02-24

## 2022-02-13 MED ADMIN — lamoTRIgine (LaMICtal) tablet 400 mg: 400 mg | ORAL | @ 13:00:00

## 2022-02-13 MED ADMIN — lipase-protease-amylase (CREON) 36,000-114,000- 180,000 unit capsule 288,000 units of lipase - Patient Supplied: 8 | ORAL | @ 17:00:00

## 2022-02-13 MED ADMIN — amitriptyline (ELAVIL) tablet 100 mg: 100 mg | ORAL | @ 01:00:00

## 2022-02-13 MED ADMIN — albuterol 2.5 mg /3 mL (0.083 %) nebulizer solution 2.5 mg: 2.5 mg | RESPIRATORY_TRACT | @ 01:00:00

## 2022-02-13 MED ADMIN — sodium chloride 7% NEBULIZER solution 4 mL: 4 mL | RESPIRATORY_TRACT | @ 21:00:00

## 2022-02-13 MED ADMIN — insulin lispro (HumaLOG) injection 0-20 Units: 0-20 [IU] | SUBCUTANEOUS | @ 13:00:00

## 2022-02-13 MED ADMIN — fluticasone propionate (FLONASE) 50 mcg/actuation nasal spray 2 spray: 2 | NASAL | @ 13:00:00

## 2022-02-13 MED ADMIN — polyethylene glycol (MIRALAX) packet 17 g: 17 g | ORAL | @ 13:00:00

## 2022-02-13 MED ADMIN — insulin lispro (HumaLOG) injection 0-20 Units: 0-20 [IU] | SUBCUTANEOUS | @ 22:00:00

## 2022-02-13 MED ADMIN — piperacillin-tazobactam (ZOSYN) IVPB (premix) 4.5 g: 4.5 g | INTRAVENOUS | @ 20:00:00 | Stop: 2022-02-20

## 2022-02-13 MED ADMIN — sod chlor-bicarb-squeez bottle (NEILMED) nasal packet 1 packet: 1 | NASAL | @ 01:00:00 | Stop: 2022-02-13

## 2022-02-13 MED ADMIN — traZODone (DESYREL) tablet 150 mg: 150 mg | ORAL | @ 01:00:00

## 2022-02-13 MED ADMIN — elexacaftor-tezacaftor-ivacaft (TRIKAFTA) tablet 1 tablet **Patient Supplied**: 1 | ORAL | @ 22:00:00

## 2022-02-13 MED ADMIN — lipase-protease-amylase (CREON) 36,000-114,000- 180,000 unit capsule 288,000 units of lipase - Patient Supplied: 8 | ORAL | @ 22:00:00

## 2022-02-13 MED ADMIN — colistimethate (COLYMYCIN) 150 mg in sodium chloride (NS) 4 mL inhalation: 150 mg | RESPIRATORY_TRACT | @ 01:00:00

## 2022-02-13 MED ADMIN — oxyCODONE (ROXICODONE) immediate release tablet 10 mg: 10 mg | ORAL | @ 22:00:00 | Stop: 2022-02-24

## 2022-02-13 MED ADMIN — albuterol 2.5 mg /3 mL (0.083 %) nebulizer solution 2.5 mg: 2.5 mg | RESPIRATORY_TRACT | @ 14:00:00

## 2022-02-13 MED ADMIN — dornase alfa (PULMOZYME) 1 mg/mL solution 2.5 mg: 2.5 mg | RESPIRATORY_TRACT | @ 14:00:00

## 2022-02-13 MED ADMIN — pantoprazole (PROTONIX) EC tablet 20 mg: 20 mg | ORAL | @ 13:00:00

## 2022-02-13 MED ADMIN — insulin lispro (HumaLOG) injection 36 Units: 36 [IU] | SUBCUTANEOUS | @ 22:00:00

## 2022-02-13 MED ADMIN — atorvastatin (LIPITOR) tablet 40 mg: 40 mg | ORAL | @ 01:00:00

## 2022-02-13 MED ADMIN — colistimethate (COLYMYCIN) 150 mg in sodium chloride (NS) 4 mL inhalation: 150 mg | RESPIRATORY_TRACT | @ 14:00:00

## 2022-02-13 MED ADMIN — sodium chloride 7% NEBULIZER solution 4 mL: 4 mL | RESPIRATORY_TRACT | @ 14:00:00

## 2022-02-13 MED ADMIN — piperacillin-tazobactam (ZOSYN) IVPB (premix) 4.5 g: 4.5 g | INTRAVENOUS | @ 02:00:00 | Stop: 2022-02-20

## 2022-02-13 MED ADMIN — insulin glargine (LANTUS) injection 45 Units: 45 [IU] | SUBCUTANEOUS | @ 02:00:00

## 2022-02-13 MED ADMIN — loratadine (CLARITIN) tablet 10 mg: 10 mg | ORAL | @ 13:00:00

## 2022-02-13 MED ADMIN — fluticasone furoate-vilanteroL (BREO ELLIPTA) 200-25 mcg/dose inhaler 1 puff: 1 | RESPIRATORY_TRACT | @ 14:00:00

## 2022-02-13 MED ADMIN — piperacillin-tazobactam (ZOSYN) IVPB (premix) 4.5 g: 4.5 g | INTRAVENOUS | @ 07:00:00 | Stop: 2022-02-20

## 2022-02-13 MED ADMIN — insulin lispro (HumaLOG) injection 36 Units: 36 [IU] | SUBCUTANEOUS | @ 13:00:00

## 2022-02-13 MED ADMIN — piperacillin-tazobactam (ZOSYN) IVPB (premix) 4.5 g: 4.5 g | INTRAVENOUS | @ 13:00:00 | Stop: 2022-02-20

## 2022-02-13 MED ADMIN — sodium chloride 7% NEBULIZER solution 4 mL: 4 mL | RESPIRATORY_TRACT | @ 01:00:00

## 2022-02-13 MED ADMIN — lipase-protease-amylase (CREON) 36,000-114,000- 180,000 unit capsule 288,000 units of lipase - Patient Supplied: 8 | ORAL | @ 13:00:00

## 2022-02-13 MED ADMIN — oxyCODONE (ROXICODONE) immediate release tablet 10 mg: 10 mg | ORAL | @ 17:00:00 | Stop: 2022-02-24

## 2022-02-13 MED ADMIN — oxyCODONE (ROXICODONE) immediate release tablet 10 mg: 10 mg | ORAL | @ 02:00:00 | Stop: 2022-02-24

## 2022-02-13 NOTE — Unmapped (Signed)
Pulmonary Consult Service  Follow-Up Note      Primary Service:   Hospitalist- [place]  Primary Service Attending:  Caren Hazy, PA  Reason for Consult:   Cystic fibrosis    IMPRESSION and RECOMMENDATIONS     Christian Young is a 36 y.o. male with a history of cystic fibrosis (S945L and [phone] insertion) on elexacaftor/tezacaftor/ivacaftor with pulmonary, sinus, pancreatic, and gastrointestinal manifestations who presents with a cystic fibrosis flare and sinus infection. Follows w/ [facility]. PFTs with a drop in his FEV1 of over 10%.  We recommend treating this with antibiotics and airway clearance. He may repeat pulmonary function tests at any point; given symptomatic improvement, we will repeat them on 07/05. His final antibiotic duration will be determined by his performance on this testing, but will comprise of at least 10 days of inpatient antibiotics.     #Cystic fibrosis exacerbation  - Continue IV piperacillin/tazobactam (06/27- ) for at least 10 days pending repeat pulmonary function tests (tentatively scheduled for 07/05)  - Continue inhaled colistin (06/15- ) for planned 28-day course  - Continue airway clearance with 7% hypertonic saline and albuterol pretreatment 4 times daily; continue mechanical airway clearance with chest vest or any device at patient's preference  -Trend CRP and ESR weekly  - f/u sinus cultures (NGTD)  - Prior CF cultures w/ mucoid & smooth PSA, +carbapenem & fluoroquinolone resistance    #Cystic fibrosis-related sinus disease  - S/p sinus surgery with ENT on 06/30    #Cystic fibrosis-related pancreatic insufficiency diabetes  - Agree with endocrine consult    #Cystic fibrosis-related constipation  - Agree with bowel regimen    Christian Young was seen, examined and discussed with  Dr. Hoyle Barr  Thank you for involving [name] in his care. We look forward to following with you.  Please don't hesitate to page [name] with any questions or concerns at [phone] (pulmonary consult fellow).    Hilda Blades, MD  [facility] Fellow, PGY-4  02/13/2022    SUBJECTIVE:     Continues to feel well, denies complaints    Medications:     Scheduled Meds:   albuterol  2.5 mg Nebulization 4x Daily (RT)    amitriptyline  100 mg Oral Nightly    atorvastatin  40 mg Oral Nightly    colistimetate (COLISTIN) inhalation  150 mg Inhalation Q12H SCH    dornase alfa  2.5 mg Inhalation Daily    elexacaftor-tezacaftor-ivacaft  2 tablet Oral Daily    And    elexacaftor-tezacaftor-ivacaft  1 tablet Oral Q PM    FLUoxetine  60 mg Oral Daily    fluticasone furoate-vilanteroL  1 puff Inhalation Daily (RT)    fluticasone propionate  2 spray Each Nare Daily    insulin glargine  50 Units Subcutaneous Nightly    insulin lispro  0-20 Units Subcutaneous ACHS    insulin lispro  36 Units Subcutaneous 3xd Meals    lamoTRIgine  400 mg Oral Daily    lipase-protease-amylase  8 capsule Oral 3xd Meals    lisinopriL  10 mg Oral Daily    loratadine  10 mg Oral Daily    montelukast  10 mg Oral Nightly    pantoprazole  20 mg Oral Daily    MVW Complete (pediatric multivit 61-D3-vit K)  2 capsule Oral Daily    piperacillin-tazobactam (ZOSYN) IV (intermittent)  4.5 g Intravenous Q6H    polyethylene glycol  17 g Oral Daily    pramipexole  0.25 mg Oral Nightly    sod chlor-bicarb-squeez bottle  1 packet Each Nare BID    sodium chloride 7%  4 mL Nebulization 4x Daily (RT)    traZODone  150 mg Oral Nightly     Continuous Infusions:  PRN Meds:.aluminum-magnesium hydroxide-simethicone, dextrose, glucagon, glucose, guaiFENesin, oxyCODONE **OR** oxyCODONE, pancrelipase (Lip-Prot-Amyl), senna     PHYSICAL EXAM:   BP (S) 109/75  - Pulse 89  - Temp 36.9 ??C (98.4 ??F)  - Resp 18  - Ht 182.9 cm (6' 0.01)  - Wt (!) 107 kg (235 lb 14.3 oz)  - SpO2 98%  - BMI 31.99 kg/m??   General: Alert and oriented, no acute distress  HEENT: MMM, nasal packing in place w/ sutures taped to bridge of nose  CV: RRR, no m/r/g  Lungs: CTAB, no increased work of breathing  Abd: Soft, NT, ND, no rebound or guarding  Ext: Warm, well perfused, no peripheral edema  Skin: No rashes  Neuro: No focal deficits    LABORATORY and RADIOLOGY DATA:     Pertinent Laboratory Data from the last 24 hours:  Lab Results   Component Value Date    WBC 7.1 02/12/2022    HGB 11.6 (L) 02/12/2022    HCT 34.6 (L) 02/12/2022    PLT 252 02/12/2022     Lab Results   Component Value Date    NA 140 02/12/2022    K 3.8 02/12/2022    CL 105 02/12/2022    CO2 28.0 02/12/2022    BUN 7 (L) 02/12/2022    CREATININE 0.65 02/12/2022    GLU 195 (H) 02/12/2022    CALCIUM 8.6 (L) 02/12/2022    MG 1.8 02/12/2022    PHOS 3.9 02/06/2022       Lab Results   Component Value Date    BILITOT 0.3 02/12/2022    BILIDIR 0.20 02/06/2022    PROT 6.4 02/12/2022    ALBUMIN 3.6 02/12/2022    ALT 53 (H) 02/12/2022    AST 27 02/12/2022    ALKPHOS 80 02/12/2022       Lab Results   Component Value Date    LABPROT 11.8 01/18/2020    INR 1.06 02/07/2022    APTT 31.6 11/03/2021       Pertinent Micro Data:  Blood Culture, Routine (no units)   Date Value   02/06/2022 No Growth at 5 days   02/06/2022 No Growth at 5 days     WBC (10*9/L)   Date Value   02/12/2022 7.1          Pertinent Imaging Data from the last 24 hours:  Reviewed in [facility]

## 2022-02-13 NOTE — Unmapped (Signed)
Endocrine Team Follow Up Consult Note     Requesting Attending Physician : Christian Hazy, PA  Service Requesting Consult : Med Bern[facility])  Primary Care Provider: Hal Morales, NP    IMPRESSION:  Christian Young is a 36 y.o. male admitted for CF exacerbation and revision of sinus surgery. We have been consulted at the request of Christian Pennie Banter, PA to evaluate Christian Young for hyperglycemia.     MEDICAL DECISION MAKING   Diagnoses:  1.CFRD. Uncontrolled. With severe hyperglycemia last 24 hours.  2. Nutrition: Complicating glycemic control. Increasing risk for both hypoglycemia and hyperglycemia.  3. Infection. Complicating glycemic control and increasing risk for hyperglycemia.  4. Obesity. Complicating glycemic control and increasing risk for hyperglycemia.   5. Steroids. Complicating glycemic control and increasing risk for hyperglycemia.    Data reviewed 02/13/22:  ??? Diagnostic studies and interpretation:    o  POCT glucose - severe hyperglycemia   ??? Notes reviewed: Primary team and nursing notes    Overall impression based on above reviews and history:  -pt with persistent prandial hyperglycemia despite last dose of steroids > 3 days ago so will increase mealtime by 20%; will increase basal by 10%       RECOMMENDATIONS:   -increase to Lantus 50u  at bedtime   -50% if NPO   -increase to lispro 36u TID AC   -continue lispro correctional TG 140, ISF 20  - Hypoglycemia protocol  - POCT-BG achs.  - Ensure patient is on glucose precautions.     Discharge Planning:   -will message schedulers closer to discharge for follow-up with both [facility] Endocrinologist (will need new faculty) and Christian Young  -pt will likely need subcutaneous insulin (with insulin pens) at discharge as his current insulin requirements are too great for omnipod pods   -can consider alternative pump vs concentrated insulin for Omnipod once seen in Endocrine clinic     Thank you for this consult. Discussed plan with primary team. We will continue to follow and make recommendations and place orders as appropriate.    Please page with questions or concerns: Endocrine Fellow: [phone]  DCT on call from 6AM - 3PM on weekdays then endocrine fellow on call: [phone] from 3PM - 6AM on weekdays and on weekends and holidays.   If APP cannot be reached, please page the endocrine fellow on call.      Subjective:  Interval hx: Today 02/13/22, pt standing at bedside again. He denies any low blood sugars.     Initial H&P:    Christian Young is a 36 y.o. male with PMH significant for  cystic fibrosis, essential hypertension, depressive disorder, diabetes related to cystic fibrosis, cystic fibrosis related sinus disease, pancreatic insufficiency due to cystic fibrosis, and history of DVT  who is admitted for CF exacerbation.     Pt states that he usually uses an omnipod, however, due to his insulin requirements, the pods are only lasting a day and a half. He reports that his settings have not changed since he was last seen in clinic last summer.     Diabetes History:  Patient has a history of Cystic fibrosis related diabetes diagnosed ~2017.  Diabetes is managed by: Christian Young  [facility]) - last appt July 2022.  Current home diabetes regimen: Omnipod with Dexcom (settings below).      Basal rates- 12am-12am 1.5 units/hr   TBD =36 units  Insulin to carbohydrate ratios:  1: 4.5 g carbs  Insulin  sensitivity factor:  1: 25 mg/dl   Active insulin duration = 4 hours  BG targets: 130 mg/dL  Correct above 454 mg/dl   Current home blood glucose monitoring: dexcom.  Hypoglycemia awareness: yes.  Complications related to diabetes: none known    Current Nutrition:  Active Orders   Diet    Nutrition Therapy High Calorie High Protein         ROS: As per HPI.    ??? albuterol  2.5 mg Nebulization 4x Daily (RT)   ??? amitriptyline  100 mg Oral Nightly   ??? atorvastatin  40 mg Oral Nightly   ??? colistimetate (COLISTIN) inhalation  150 mg Inhalation Q12H [facility]   ??? dornase alfa 2.5 mg Inhalation Daily   ??? elexacaftor-tezacaftor-ivacaft  2 tablet Oral Daily    And   ??? elexacaftor-tezacaftor-ivacaft  1 tablet Oral Q PM   ??? FLUoxetine  60 mg Oral Daily   ??? fluticasone furoate-vilanteroL  1 puff Inhalation Daily (RT)   ??? fluticasone propionate  2 spray Each Nare Daily   ??? insulin glargine  45 Units Subcutaneous Nightly   ??? insulin lispro  0-20 Units Subcutaneous ACHS   ??? insulin lispro  30 Units Subcutaneous 3xd Meals   ??? lamoTRIgine  400 mg Oral Daily   ??? lipase-protease-amylase  8 capsule Oral 3xd Meals   ??? lisinopriL  10 mg Oral Daily   ??? loratadine  10 mg Oral Daily   ??? montelukast  10 mg Oral Nightly   ??? pantoprazole  20 mg Oral Daily   ??? MVW Complete (pediatric multivit 61-D3-vit K)  2 capsule Oral Daily   ??? piperacillin-tazobactam (ZOSYN) IV (intermittent)  4.5 g Intravenous Q6H   ??? polyethylene glycol  17 g Oral Daily   ??? pramipexole  0.25 mg Oral Nightly   ??? sod chlor-bicarb-squeez bottle  1 packet Each Nare BID   ??? sodium chloride 7%  4 mL Nebulization 4x Daily (RT)   ??? traZODone  150 mg Oral Nightly       Current Outpatient Medications   Medication Instructions   ??? acetaminophen (TYLENOL) 500 mg, Oral, Every 6 hours PRN   ??? albuterol (PROVENTIL HFA;VENTOLIN HFA) 90 mcg/actuation inhaler 2 puffs, Inhalation, Every 6 hours PRN   ??? albuterol 2.5 mg, Nebulization, 2 times a day   ??? alcohol swabs (ALCOHOL PREP PADS) PadM Use as directed with inhaled antibiotics   ??? amitriptyline (ELAVIL) 100 MG tablet TAKE ONE TABLET BY MOUTH AT BEDTIME   ??? atorvastatin (LIPITOR) 40 mg, Oral, Nightly   ??? blood sugar diagnostic (GLUCOSE BLOOD) Strp Other, 3 times a day (with meals), Rx sent to [facility] 01/04/20    ??? blood-glucose meter kit Dispense meter that is preferred by patient's insurance company   ??? blood-glucose meter,continuous (DEXCOM G6 RECEIVER) Misc Use as directed   ??? blood-glucose sensor (DEXCOM G6 SENSOR) Devi Use sq as directed every 10 days   ??? blood-glucose transmitter (DEXCOM G6 TRANSMITTER) Devi Use as directed, replace every 90 days   ??? budesonide-formoteroL (SYMBICORT) 160-4.5 mcg/actuation inhaler 2 puffs, Inhalation, 2 times a day (standard)   ??? colistimethate (COLYMYCIN) 150 mg injection Inhale 2 mL (150 mg total) Two (2) times a day, alternating 28 days on and 28 days off. Inject 2mL SWFI into colistin vial & gently mix, then withdraw 2mL (150mg ) for dose.   ??? colistimethate (COLYMYCIN) 150 mg injection Inhale 2 mL (150 mg total) Two (2) times a day, alternating 28 days on  and 28 days off. Inject 2mL SWFI into colistin vial & gently mix, then withdraw 2mL (150mg ) for dose.   ??? EASY TOUCH LANCING DEVICE Misc Use as directed.   ??? EASY TOUCH TWIST LANCETS 30 gauge Misc Use as directed.   ??? elexacaftor-tezacaftor-ivacaft (TRIKAFTA) 100-50-75 mg(d) /150 mg (n) tablet Take 2 Tablets (orange) by mouth in the morning and 1 tablet (blue) in the evening with fatty food   ??? empty container Misc Use as directed to dispose of syringes   ??? FLUoxetine 60 mg, Oral, Daily (standard)   ??? fluticasone propionate (FLONASE) 50 mcg/actuation nasal spray 2 sprays, Each Nare, Daily (standard)   ??? FREESTYLE LIBRE 14 DAY SENSOR kit Use 3-4 times daily   ??? gabapentin (NEURONTIN) 600 mg, Oral, 3 times a day (standard)   ??? insulin ASPART (NOVOLOG U-100 INSULIN ASPART) 100 unit/mL injection Subcutaneously infuse up to 150 units daily via insulin pump   ??? insulin pump cart,automated,BT (OMNIPOD 5 G6 PODS, GEN 5,) Crtg 1 each, subcutaneous (via wearable injector), Every 3 days, Change every 72 hour   ??? lamoTRIgine (LAMICTAL) 400 mg, Oral, Daily (standard)   ??? levocetirizine (XYZAL) 5 mg, Oral, Every evening   ??? lipase-protease-amylase (CREON) 36,000-114,000- 180,000 unit CpDR Take 8 caps by mouth with meals three times daily and 4 caps with snacks up to three times a day.   ??? lisinopriL (PRINIVIL,ZESTRIL) 10 mg, Oral, Daily (standard)   ??? montelukast (SINGULAIR) 10 mg, Oral, Nightly   ??? MVW COMPLETE FORMUL PROBIOTIC 40 billion cell -15 mg CpDR 1 capsule, Oral, Daily (standard)   ??? MVW Complete, pediatric multivit 61-D3-vit K, (MVW COMPLETE FORMULATION) 1,500-800 unit-mcg cap 2 capsules, Oral, 2 times a day (standard)   ??? nebulizers (LC PLUS) Misc use as directed with nebulized medications twice daily.   ??? nebulizers (PARI LC D NEBULIZER) Misc Use with inhaled medication.   ??? nebulizers Misc Use as directed with inhaled medications   ??? omeprazole (PRILOSEC) 20 mg, Oral, Daily (standard)   ??? pen needle, diabetic (BD ULTRA-FINE NANO PEN NEEDLE) 32 gauge x 5/32 (4 mm) Ndle use up to 4 times daily   ??? pramipexole (MIRAPEX) 0.25 mg, Oral, Nightly   ??? PULMOZYME 2.5 mg, Inhalation, Daily (standard)   ??? sodium chloride 7% 7 % Nebu 4 mL, Nebulization, 2 times a day   ??? sterile water Soln Use 2 mL to mix colistin, then add another 1 mL SWFI into neb cup along with mixed colistin.   ??? syringe with needle (BD LUER-LOK SYRINGE) 3 mL 21 gauge x 1 Syrg For use with inhaled antibiotic   ??? tamsulosin (FLOMAX) 0.4 mg, Oral, Daily PRN, For kidney stone   ??? traZODone (DESYREL) 150 mg, Oral, Nightly   ??? XARELTO 20 mg, Oral, Daily (standard)         Past medical, surgical, social, and family hx reviewed in chart.     OBJECTIVE:  BP 102/70  - Pulse 85  - Temp 37.2 ??C (99 ??F) (Oral)  - Resp 18  - Ht 182.9 cm (6' 0.01)  - Wt (!) 107 kg (235 lb 14.3 oz)  - SpO2 95%  - BMI 31.99 kg/m??   Wt Readings from Last 12 Encounters:   02/07/22 (!) 107 kg (235 lb 14.3 oz)   01/25/22 (!) 107.5 kg (237 lb)   01/25/22 (!) 107.5 kg (237 lb)   11/03/21 (!) 108.2 kg (238 lb 8.6 oz)   09/09/21 (!) 110.8 kg (244 lb 4.3  oz)   07/30/21 (!) 111.1 kg (245 lb)   06/08/21 (!) 113.4 kg (250 lb)   03/14/21 (!) 111.2 kg (245 lb 1.6 oz)   03/09/21 (!) 109 kg (240 lb 3.2 oz)   02/06/21 (!) 103.4 kg (228 lb)   11/24/20 (!) 101.3 kg (223 lb 4.8 oz)   06/28/20 (!) 105.8 kg (233 lb 4.8 oz)       General: WD, WN. Very pleasant. Body mass index is 31.99 kg/m??.   HEENT: Normocephalic, atraumatic. EOMI.    Neck: No stridor.     Respiratory: No tachypnea.   Abdominal: Non-distended.    MSK: standing with normal station.   Neuro: Awake, alert, and oriented

## 2022-02-13 NOTE — Unmapped (Addendum)
Pt's A&Ox4, VSS, afebrile, c/o pain and nasal pressure, PRN oxyx1 given with improving, got SSI and lantus for BG, pt refused midnight vital signs, got scheduled zosyn, no fall or injuries, no acute changes overnight.        Problem: Adult Inpatient Plan of Care  Goal: Plan of Care Review  Outcome: Ongoing - Unchanged  Goal: Patient-Specific Goal (Individualized)  Outcome: Ongoing - Unchanged  Goal: Absence of Hospital-Acquired Illness or Injury  Outcome: Ongoing - Unchanged  Intervention: Identify and Manage Fall Risk  Recent Flowsheet Documentation  Taken 02/12/2022 2010 by Sharlee Blew, RN  Safety Interventions:   lighting adjusted for tasks/safety   low bed   nonskid shoes/slippers when out of bed   commode/urinal/bedpan at bedside   fall reduction program maintained  Intervention: Prevent Skin Injury  Recent Flowsheet Documentation  Taken 02/12/2022 2010 by Sharlee Blew, RN  Skin Protection: adhesive use limited  Intervention: Prevent and Manage VTE (Venous Thromboembolism) Risk  Recent Flowsheet Documentation  Taken 02/12/2022 2010 by Sharlee Blew, RN  Activity Management: activity adjusted per tolerance  Intervention: Prevent Infection  Recent Flowsheet Documentation  Taken 02/12/2022 2010 by Sharlee Blew, RN  Infection Prevention: cohorting utilized  Goal: Optimal Comfort and Wellbeing  Outcome: Ongoing - Unchanged  Goal: Readiness for Transition of Care  Outcome: Ongoing - Unchanged  Goal: Rounds/Family Conference  Outcome: Ongoing - Unchanged     Problem: Infection  Goal: Absence of Infection Signs and Symptoms  Outcome: Ongoing - Unchanged  Intervention: Prevent or Manage Infection  Recent Flowsheet Documentation  Taken 02/12/2022 2010 by Sharlee Blew, RN  Infection Management: aseptic technique maintained  Isolation Precautions:   contact precautions maintained   droplet precautions maintained     Problem: Impaired Wound Healing  Goal: Optimal Wound Healing  Outcome: Ongoing - Unchanged  Intervention: Promote Wound Healing  Recent Flowsheet Documentation  Taken 02/12/2022 2010 by Sharlee Blew, RN  Activity Management: activity adjusted per tolerance     Problem: Fall Injury Risk  Goal: Absence of Fall and Fall-Related Injury  Outcome: Ongoing - Unchanged  Intervention: Promote Injury-Free Environment  Recent Flowsheet Documentation  Taken 02/12/2022 2010 by Sharlee Blew, RN  Safety Interventions:   lighting adjusted for tasks/safety   low bed   nonskid shoes/slippers when out of bed   commode/urinal/bedpan at bedside   fall reduction program maintained

## 2022-02-13 NOTE — Unmapped (Signed)
Hospital Medicine Daily Progress Note    Assessment/Plan:    Principal Problem:    Cystic fibrosis exacerbation (CMS-HCC)  Active Problems:    Essential hypertension    Depressive disorder    Mood disorder (CMS-HCC)    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)    Chronic pansinusitis    Pancreatic insufficiency due to cystic fibrosis (CMS-HCC)    Bronchiectasis (CMS-HCC)    Chronic deep vein thrombosis (DVT) of lower extremity (CMS-HCC)  Resolved Problems:    * No resolved hospital problems. *                 Christian Young is a 36 y.o. male that presented to [facility] with Cystic fibrosis exacerbation (CMS-HCC).    Cystic fibrosis exacerbation: Patient presented to the ED with worsening fatigue, dyspnea and increased sputum production in the setting of recent rhinovirus for which he had been started on a course of PO Levaquin and 1 month of inhaled colistin. On admission, ??RVP negative, CF sputum culture negative. ??Chest x-ray with streaky right lower lung opacity, likely atelectasis.??Pt reports breathing is improving.??  -Pulmonology??following  -Continue IV Zosyn, anticipate a 2-week course with final duration based on inflammatory markers and clinical response (6/27-7/10)  -Continue inhaled colistin x28 days (6/15-7/12)  -albuterol nebs, 7% hypertonic saline, chest vest 4x daily for airway clearance  -Continue home Trikafta, Pulmozyme, Breo Ellipta  -Follow-up PFTs ordered for 7/5  -Weekly CRP/ESR, CBC w/ diff, CMP  ??  CF related sinus disease: Repeat CT sinus this admission showed near complete opacification of the bilateral maxillary sinuses, although the ostiomeatal units are patent bilaterally.??  -ENT following, recs as follows:  - Nasal saline irrigations BID starting POD1. Can use a 60 mL Toomey syringe filled with sterile saline. Lean over the sink and irrigate each nostril. If not tolerating can do nasal saline sprays (3-4 sprays in each nare QID)  - Finger cots and Doyle splints will be removed POD3  -??Hold anticoagulation??until POD5, please continue to??monitor inpatient until??anticoagulation is started  -S/P revision sinus surgery ??6/30  - will need at least 5 days of antibiotics post-operatively  - see below for heme recs for anticoagulation  -Continue home Flonase, Claritin, Singulair  -Acute pain post op,??managed with current regimen of 5-10 mg oxycodone q4 PRN  ??  CF related diabetes, poorly-controlled: A1c 11.3 on admission, up from 10.6 in March. ??Uses an insulin pump at home but didn't bring it with him. During last hospitalization received Lantus 45 units nightly and lispro 20 units TID AC.????Per endocrinology, secondary to current insulin requirement, anticipate patient will need subcutaneous insulin with pens at discharge.  -Appreciate endocrinology consultation  -Continue previous regimen of Lantus 45 U at bedtime,??increase??lispro 30??U TID AC, SSI??  -Outpatient endocrinology follow-up  ??  History of DVT: ??Hematology consulted given upcoming surgery in setting of unclear clotting history. They recommend transitioning to prophylactic anticoagulation with outpatient follow up for long-term anticoagulation planning.   - prophylactic enoxaparin; hold day of surgery, 6/30,??and for 5 days after surgery (resume 6/6), observe for 1 day after starting, potential dc 6/7 pending pulm recs  - when resuming anticoagulation will do so at prophylactic dosing with rivoroxiban 10 mg daily (was previously on 20 mg daily). Per ENT, prefer this starts while inpt??  -Outpatient hematology follow-up  ??  CF related pancreatic insufficiency:  -Continue home Creon  ??  Mood disorder:   -Continue home fluoxetine, amitriptyline, lamotrigine, trazodone  ??  Hypertension:??Blood pressure currently stable.  -  Continue home lisinopril  ??  GERD:  -Continue formulary equivalent of home PPI  ??  Restless leg syndrome:   -Continue home Mirapex  ??  Chronic back pain: Patient reports he is no longer taking gabapentin.  ??  Elevated liver enzymes (resolved): Has had mild elevations in the past which may be related to fatty liver disease. LFTs??currently stable.  ??  Dispo: It uncertain if patient may be a candidate for home IV antibiotics; will need to reassess later in his stay after OR. ??Please see the note from Dr. [name], primary pulmonologist, dated 6/27 for details of care coordination prior to consideration of home infusion.  ??  I personally spent??25??minutes face-to-face and non-face-to-face in the care of this patient, which includes all pre, intra, and post visit time on the date of service. ??All documented time was specific to the E/M visit and does not include any procedures that may have been performed.??    ___________________________________________________________________    Subjective:  Doing well. Breathing is great. Nose feels okay, pain meds helping. BM daily. No current needs.     General ROS: negative     Labs/Studies:  Labs and Studies from the last 24hrs per EMR and Reviewed    Objective:  Temp:  [36.7 ??C (98.1 ??F)-37.2 ??C (99 ??F)] 36.8 ??C (98.2 ??F)  Heart Rate:  [84-99] 98  Resp:  [18] 18  BP: (102-120)/(65-88) 116/88  SpO2:  [94 %-98 %] 98 %    GEN: NAD, lying in bed  EYES: EOMI  ENT: MMM  CV: RRR  PULM: CTA B  ABD: soft, NT/ND, +BS  EXT: No edema

## 2022-02-14 MED ADMIN — traZODone (DESYREL) tablet 150 mg: 150 mg | ORAL | @ 01:00:00

## 2022-02-14 MED ADMIN — lipase-protease-amylase (CREON) 36,000-114,000- 180,000 unit capsule 288,000 units of lipase - Patient Supplied: 8 | ORAL | @ 22:00:00

## 2022-02-14 MED ADMIN — lisinopriL (PRINIVIL,ZESTRIL) tablet 10 mg: 10 mg | ORAL | @ 13:00:00

## 2022-02-14 MED ADMIN — enoxaparin (LOVENOX) syringe 40 mg: 40 mg | SUBCUTANEOUS | @ 16:00:00

## 2022-02-14 MED ADMIN — pediatric multivitamin-vit D3 1,500 unit-vit K 800 mcg (MVW COMPLETE FORMULATION) capsule: 2 | ORAL | @ 13:00:00

## 2022-02-14 MED ADMIN — sod chlor-bicarb-squeez bottle (NEILMED) nasal packet 1 packet: 1 | NASAL | @ 01:00:00 | Stop: 2022-02-20

## 2022-02-14 MED ADMIN — insulin lispro (HumaLOG) injection 0-20 Units: 0-20 [IU] | SUBCUTANEOUS | @ 13:00:00

## 2022-02-14 MED ADMIN — albuterol 2.5 mg /3 mL (0.083 %) nebulizer solution 2.5 mg: 2.5 mg | RESPIRATORY_TRACT | @ 20:00:00

## 2022-02-14 MED ADMIN — sodium chloride 7% NEBULIZER solution 4 mL: 4 mL | RESPIRATORY_TRACT | @ 20:00:00

## 2022-02-14 MED ADMIN — piperacillin-tazobactam (ZOSYN) IVPB (premix) 4.5 g: 4.5 g | INTRAVENOUS | @ 08:00:00 | Stop: 2022-02-20

## 2022-02-14 MED ADMIN — fluticasone furoate-vilanteroL (BREO ELLIPTA) 200-25 mcg/dose inhaler 1 puff: 1 | RESPIRATORY_TRACT | @ 13:00:00

## 2022-02-14 MED ADMIN — elexacaftor-tezacaftor-ivacaft (TRIKAFTA) tablet 1 tablet **Patient Supplied**: 1 | ORAL | @ 22:00:00

## 2022-02-14 MED ADMIN — albuterol 2.5 mg /3 mL (0.083 %) nebulizer solution 2.5 mg: 2.5 mg | RESPIRATORY_TRACT | @ 13:00:00

## 2022-02-14 MED ADMIN — piperacillin-tazobactam (ZOSYN) IVPB (premix) 4.5 g: 4.5 g | INTRAVENOUS | @ 16:00:00 | Stop: 2022-02-20

## 2022-02-14 MED ADMIN — insulin lispro (HumaLOG) injection 0-20 Units: 0-20 [IU] | SUBCUTANEOUS | @ 22:00:00

## 2022-02-14 MED ADMIN — pramipexole (MIRAPEX) tablet 0.25 mg: .25 mg | ORAL | @ 01:00:00

## 2022-02-14 MED ADMIN — oxyCODONE (ROXICODONE) immediate release tablet 10 mg: 10 mg | ORAL | @ 02:00:00 | Stop: 2022-02-24

## 2022-02-14 MED ADMIN — colistimethate (COLYMYCIN) 150 mg in sodium chloride (NS) 4 mL inhalation: 150 mg | RESPIRATORY_TRACT | @ 01:00:00

## 2022-02-14 MED ADMIN — lipase-protease-amylase (CREON) 36,000-114,000- 180,000 unit capsule 288,000 units of lipase - Patient Supplied: 8 | ORAL | @ 13:00:00

## 2022-02-14 MED ADMIN — piperacillin-tazobactam (ZOSYN) IVPB (premix) 4.5 g: 4.5 g | INTRAVENOUS | @ 01:00:00 | Stop: 2022-02-20

## 2022-02-14 MED ADMIN — polyethylene glycol (MIRALAX) packet 17 g: 17 g | ORAL | @ 13:00:00

## 2022-02-14 MED ADMIN — insulin glargine (LANTUS) injection 50 Units: 50 [IU] | SUBCUTANEOUS | @ 01:00:00

## 2022-02-14 MED ADMIN — insulin lispro (HumaLOG) injection 36 Units: 36 [IU] | SUBCUTANEOUS | @ 13:00:00 | Stop: 2022-02-14

## 2022-02-14 MED ADMIN — pantoprazole (PROTONIX) EC tablet 20 mg: 20 mg | ORAL | @ 13:00:00

## 2022-02-14 MED ADMIN — lamoTRIgine (LaMICtal) tablet 400 mg: 400 mg | ORAL | @ 13:00:00

## 2022-02-14 MED ADMIN — FLUoxetine (PROzac) capsule 60 mg: 60 mg | ORAL | @ 13:00:00

## 2022-02-14 MED ADMIN — insulin lispro (HumaLOG) injection 36 Units: 36 [IU] | SUBCUTANEOUS | @ 16:00:00 | Stop: 2022-02-14

## 2022-02-14 MED ADMIN — senna (SENOKOT) tablet 2 tablet: 2 | ORAL | @ 01:00:00

## 2022-02-14 MED ADMIN — atorvastatin (LIPITOR) tablet 40 mg: 40 mg | ORAL | @ 01:00:00

## 2022-02-14 MED ADMIN — colistimethate (COLYMYCIN) 150 mg in sodium chloride (NS) 4 mL inhalation: 150 mg | RESPIRATORY_TRACT | @ 13:00:00

## 2022-02-14 MED ADMIN — oxyCODONE (ROXICODONE) immediate release tablet 5 mg: 5 mg | ORAL | @ 13:00:00 | Stop: 2022-02-24

## 2022-02-14 MED ADMIN — dornase alfa (PULMOZYME) 1 mg/mL solution 2.5 mg: 2.5 mg | RESPIRATORY_TRACT | @ 13:00:00

## 2022-02-14 MED ADMIN — oxyCODONE (ROXICODONE) immediate release tablet 10 mg: 10 mg | ORAL | @ 19:00:00 | Stop: 2022-02-24

## 2022-02-14 MED ADMIN — fluticasone propionate (FLONASE) 50 mcg/actuation nasal spray 2 spray: 2 | NASAL | @ 13:00:00

## 2022-02-14 MED ADMIN — piperacillin-tazobactam (ZOSYN) IVPB (premix) 4.5 g: 4.5 g | INTRAVENOUS | @ 22:00:00 | Stop: 2022-02-20

## 2022-02-14 MED ADMIN — amitriptyline (ELAVIL) tablet 100 mg: 100 mg | ORAL | @ 01:00:00

## 2022-02-14 MED ADMIN — sodium chloride 7% NEBULIZER solution 4 mL: 4 mL | RESPIRATORY_TRACT | @ 13:00:00

## 2022-02-14 MED ADMIN — sod chlor-bicarb-squeez bottle (NEILMED) nasal packet 1 packet: 1 | NASAL | @ 13:00:00 | Stop: 2022-02-20

## 2022-02-14 MED ADMIN — insulin lispro (HumaLOG) injection 0-20 Units: 0-20 [IU] | SUBCUTANEOUS | @ 16:00:00

## 2022-02-14 MED ADMIN — montelukast (SINGULAIR) tablet 10 mg: 10 mg | ORAL | @ 01:00:00

## 2022-02-14 MED ADMIN — sodium chloride 7% NEBULIZER solution 4 mL: 4 mL | RESPIRATORY_TRACT

## 2022-02-14 MED ADMIN — loratadine (CLARITIN) tablet 10 mg: 10 mg | ORAL | @ 13:00:00

## 2022-02-14 MED ADMIN — insulin lispro (HumaLOG) injection 0-20 Units: 0-20 [IU] | SUBCUTANEOUS | @ 02:00:00

## 2022-02-14 MED ADMIN — lipase-protease-amylase (CREON) 36,000-114,000- 180,000 unit capsule 288,000 units of lipase - Patient Supplied: 8 | ORAL | @ 16:00:00

## 2022-02-14 MED ADMIN — insulin lispro (HumaLOG) injection 40 Units: 40 [IU] | SUBCUTANEOUS | @ 22:00:00

## 2022-02-14 MED ADMIN — elexacaftor-tezacaftor-ivacaft (TRIKAFTA) tablet 2 tablet **Patient Supplied**: 2 | ORAL | @ 13:00:00

## 2022-02-14 MED ADMIN — albuterol 2.5 mg /3 mL (0.083 %) nebulizer solution 2.5 mg: 2.5 mg | RESPIRATORY_TRACT

## 2022-02-14 NOTE — Unmapped (Signed)
Endocrine Team Follow Up Consult Note     Requesting Attending Physician : Jeanene Erb, FNP  Service Requesting Consult : [facility])  Primary Care Provider: Hal Morales, NP    IMPRESSION:  Christian Young is a 36 y.o. male admitted for CF exacerbation and revision of sinus surgery. We have been consulted at the request of Jeanene Erb, FNP to evaluate Christian Young for hyperglycemia.     MEDICAL DECISION MAKING   Diagnoses:  1.CFRD. Uncontrolled. With severe hyperglycemia last 24 hours.  2. Nutrition: Complicating glycemic control. Increasing risk for both hypoglycemia and hyperglycemia.  3. Infection. Complicating glycemic control and increasing risk for hyperglycemia.  4. Obesity. Complicating glycemic control and increasing risk for hyperglycemia.   5. Steroids. Complicating glycemic control and increasing risk for hyperglycemia. Last given 6/30, now out of system.    Data reviewed 02/14/22:  Diagnostic studies and interpretation:     POCT glucose - severe generalized hyperglycemia   Notes reviewed: Primary team and nursing notes    Overall impression based on above reviews and history:  Ongoing severe hyperglycemia. Total insulin requirement yesterday including corrective needs was 171 units. Current basal dosage seemed adequate based on fasting BG in target range this AM. Hyperglycemia mostly postprandial. Increasing prandial insulin dose today based on corrective needs.    RECOMMENDATIONS:   - Lantus 50u at bedtime   -50% if NPO   - lispro 40u TID AC   - continue lispro correctional TG 140, ISF 20  - Hypoglycemia protocol  - POCT-BG achs.  - Ensure patient is on glucose precautions.     Discharge Planning:   -will message schedulers closer to discharge for follow-up with both [facility] Endocrinologist (will need new faculty) and CDE  -pt will likely need subcutaneous insulin (with insulin pens) at discharge as his current insulin requirements are too great for omnipod pods   -can consider alternative pump vs concentrated insulin for Omnipod once seen in Endocrine clinic     Thank you for this consult. Discussed plan with primary team. We will continue to follow and make recommendations and place orders as appropriate.    Please page with questions or concerns: Endocrine Fellow: [phone]  DCT on call from 6AM - 3PM on weekdays then endocrine fellow on call: [phone] from 3PM - 6AM on weekdays and on weekends and holidays.   If APP cannot be reached, please page the endocrine fellow on call.      Subjective:  Interval hx: Today 02/14/22, feeling much better. Would rather keep with fixed prandial insulin doses rather than carb ratio on discharge. Knows to keep meals consistent.    Initial H&P:    Christian Young is a 36 y.o. male with PMH significant for  cystic fibrosis, essential hypertension, depressive disorder, diabetes related to cystic fibrosis, cystic fibrosis related sinus disease, pancreatic insufficiency due to cystic fibrosis, and history of DVT  who is admitted for CF exacerbation.     Pt states that he usually uses an omnipod, however, due to his insulin requirements, the pods are only lasting a day and a half. He reports that his settings have not changed since he was last seen in clinic last summer.     Diabetes History:  Patient has a history of Cystic fibrosis related diabetes diagnosed ~2017.  Diabetes is managed by: Dr. Monia Sabal  [facility]) - last appt July 2022.  Current home diabetes regimen: Omnipod with Dexcom (settings below).  Basal rates- 12am-12am 1.5 units/hr   TBD =36 units  Insulin to carbohydrate ratios:  1: 4.5 g carbs  Insulin sensitivity factor:  1: 25 mg/dl   Active insulin duration = 4 hours  BG targets: 130 mg/dL  Correct above 161 mg/dl   Current home blood glucose monitoring: dexcom.  Hypoglycemia awareness: yes.  Complications related to diabetes: none known    Current Nutrition:  Active Orders   Diet    Nutrition Therapy High Calorie High Protein         ROS: As per HPI.     albuterol  2.5 mg Nebulization 4x Daily (RT)    amitriptyline  100 mg Oral Nightly    atorvastatin  40 mg Oral Nightly    colistimetate (COLISTIN) inhalation  150 mg Inhalation Q12H SCH    dornase alfa  2.5 mg Inhalation Daily    elexacaftor-tezacaftor-ivacaft  2 tablet Oral Daily    And    elexacaftor-tezacaftor-ivacaft  1 tablet Oral Q PM    FLUoxetine  60 mg Oral Daily    fluticasone furoate-vilanteroL  1 puff Inhalation Daily (RT)    fluticasone propionate  2 spray Each Nare Daily    insulin glargine  50 Units Subcutaneous Nightly    insulin lispro  0-20 Units Subcutaneous ACHS    insulin lispro  36 Units Subcutaneous 3xd Meals    lamoTRIgine  400 mg Oral Daily    lipase-protease-amylase  8 capsule Oral 3xd Meals    lisinopriL  10 mg Oral Daily    loratadine  10 mg Oral Daily    montelukast  10 mg Oral Nightly    pantoprazole  20 mg Oral Daily    MVW Complete (pediatric multivit 61-D3-vit K)  2 capsule Oral Daily    piperacillin-tazobactam (ZOSYN) IV (intermittent)  4.5 g Intravenous Q6H    polyethylene glycol  17 g Oral Daily    pramipexole  0.25 mg Oral Nightly    sod chlor-bicarb-squeez bottle  1 packet Each Nare BID    sodium chloride 7%  4 mL Nebulization 4x Daily (RT)    traZODone  150 mg Oral Nightly       Current Outpatient Medications   Medication Instructions    acetaminophen (TYLENOL) 500 mg, Oral, Every 6 hours PRN    albuterol (PROVENTIL HFA;VENTOLIN HFA) 90 mcg/actuation inhaler 2 puffs, Inhalation, Every 6 hours PRN    albuterol 2.5 mg, Nebulization, 2 times a day    alcohol swabs (ALCOHOL PREP PADS) PadM Use as directed with inhaled antibiotics    amitriptyline (ELAVIL) 100 MG tablet TAKE ONE TABLET BY MOUTH AT BEDTIME    atorvastatin (LIPITOR) 40 mg, Oral, Nightly    blood sugar diagnostic (GLUCOSE BLOOD) Strp Other, 3 times a day (with meals), Rx sent to [facility] 01/04/20     blood-glucose meter kit Dispense meter that is preferred by patient's insurance company    blood-glucose meter,continuous (DEXCOM G6 RECEIVER) Misc Use as directed    blood-glucose sensor (DEXCOM G6 SENSOR) Devi Use sq as directed every 10 days    blood-glucose transmitter (DEXCOM G6 TRANSMITTER) Devi Use as directed, replace every 90 days    budesonide-formoteroL (SYMBICORT) 160-4.5 mcg/actuation inhaler 2 puffs, Inhalation, 2 times a day (standard)    colistimethate (COLYMYCIN) 150 mg injection Inhale 2 mL (150 mg total) Two (2) times a day, alternating 28 days on and 28 days off. Inject 2mL SWFI into colistin vial & gently mix, then withdraw 2mL (150mg ) for dose.  colistimethate (COLYMYCIN) 150 mg injection Inhale 2 mL (150 mg total) Two (2) times a day, alternating 28 days on and 28 days off. Inject 2mL SWFI into colistin vial & gently mix, then withdraw 2mL (150mg ) for dose.    EASY TOUCH LANCING DEVICE Misc Use as directed.    EASY TOUCH TWIST LANCETS 30 gauge Misc Use as directed.    elexacaftor-tezacaftor-ivacaft (TRIKAFTA) 100-50-75 mg(d) /150 mg (n) tablet Take 2 Tablets (orange) by mouth in the morning and 1 tablet (blue) in the evening with fatty food    empty container Misc Use as directed to dispose of syringes    FLUoxetine 60 mg, Oral, Daily (standard)    fluticasone propionate (FLONASE) 50 mcg/actuation nasal spray 2 sprays, Each Nare, Daily (standard)    FREESTYLE LIBRE 14 DAY SENSOR kit Use 3-4 times daily    gabapentin (NEURONTIN) 600 mg, Oral, 3 times a day (standard)    insulin ASPART (NOVOLOG U-100 INSULIN ASPART) 100 unit/mL injection Subcutaneously infuse up to 150 units daily via insulin pump    insulin pump cart,automated,BT (OMNIPOD 5 G6 PODS, GEN 5,) Crtg 1 each, subcutaneous (via wearable injector), Every 3 days, Change every 72 hour    lamoTRIgine (LAMICTAL) 400 mg, Oral, Daily (standard)    levocetirizine (XYZAL) 5 mg, Oral, Every evening    lipase-protease-amylase (CREON) 36,000-114,000- 180,000 unit CpDR Take 8 caps by mouth with meals three times daily and 4 caps with snacks up to three times a day.    lisinopriL (PRINIVIL,ZESTRIL) 10 mg, Oral, Daily (standard)    montelukast (SINGULAIR) 10 mg, Oral, Nightly    MVW COMPLETE FORMUL PROBIOTIC 40 billion cell -15 mg CpDR 1 capsule, Oral, Daily (standard)    MVW Complete, pediatric multivit 61-D3-vit K, (MVW COMPLETE FORMULATION) 1,500-800 unit-mcg cap 2 capsules, Oral, 2 times a day (standard)    nebulizers (LC PLUS) Misc use as directed with nebulized medications twice daily.    nebulizers (PARI LC D NEBULIZER) Misc Use with inhaled medication.    nebulizers Misc Use as directed with inhaled medications    omeprazole (PRILOSEC) 20 mg, Oral, Daily (standard)    pen needle, diabetic (BD ULTRA-FINE NANO PEN NEEDLE) 32 gauge x 5/32 (4 mm) Ndle use up to 4 times daily    pramipexole (MIRAPEX) 0.25 mg, Oral, Nightly    PULMOZYME 2.5 mg, Inhalation, Daily (standard)    sodium chloride 7% 7 % Nebu 4 mL, Nebulization, 2 times a day    sterile water Soln Use 2 mL to mix colistin, then add another 1 mL SWFI into neb cup along with mixed colistin.    syringe with needle (BD LUER-LOK SYRINGE) 3 mL 21 gauge x 1 Syrg For use with inhaled antibiotic    tamsulosin (FLOMAX) 0.4 mg, Oral, Daily PRN, For kidney stone    traZODone (DESYREL) 150 mg, Oral, Nightly    XARELTO 20 mg, Oral, Daily (standard)         Past medical, surgical, social, and family hx reviewed in chart.     OBJECTIVE:  BP 125/85  - Pulse 114  - Temp 36.9 ??C (98.4 ??F) (Oral)  - Resp 20  - Ht 182.9 cm (6' 0.01)  - Wt (!) 107 kg (235 lb 14.3 oz)  - SpO2 96%  - BMI 31.99 kg/m??   Wt Readings from Last 12 Encounters:   02/07/22 (!) 107 kg (235 lb 14.3 oz)   01/25/22 (!) 107.5 kg (237 lb)   01/25/22 (!) 107.5 kg (237  lb)   11/03/21 (!) 108.2 kg (238 lb 8.6 oz)   09/09/21 (!) 110.8 kg (244 lb 4.3 oz)   07/30/21 (!) 111.1 kg (245 lb)   06/08/21 (!) 113.4 kg (250 lb)   03/14/21 (!) 111.2 kg (245 lb 1.6 oz)   03/09/21 (!) 109 kg (240 lb 3.2 oz)   02/06/21 (!) 103.4 kg (228 lb)   11/24/20 (!) 101.3 kg (223 lb 4.8 oz)   06/28/20 (!) 105.8 kg (233 lb 4.8 oz)       General: WD, WN. Very pleasant. Body mass index is 31.99 kg/m??.   HEENT: Normocephalic, atraumatic. EOMI.    Neck: No stridor.     Respiratory: No tachypnea.   Abdominal: Non-distended.    MSK: standing with normal station.   Neuro: Awake, alert, and oriented

## 2022-02-14 NOTE — Unmapped (Addendum)
Pt VSS, afebrile, A/Ox4. PRN oxy given - less pain and pressure this shift. Pt BG managed with SSI. Pt free of falls or injuries with all safety measures maintained.   Problem: Adult Inpatient Plan of Care  Goal: Plan of Care Review  Outcome: Ongoing - Unchanged  Goal: Patient-Specific Goal (Individualized)  Outcome: Ongoing - Unchanged  Goal: Absence of Hospital-Acquired Illness or Injury  Outcome: Ongoing - Unchanged  Intervention: Identify and Manage Fall Risk  Recent Flowsheet Documentation  Taken 02/13/2022 0720 by Jimmy Picket, RN  Safety Interventions:   low bed   lighting adjusted for tasks/safety   bed alarm   fall reduction program maintained   family at bedside   infection management   room near unit station   nonskid shoes/slippers when out of bed  Intervention: Prevent Skin Injury  Recent Flowsheet Documentation  Taken 02/13/2022 0900 by Jimmy Picket, RN  Skin Protection:   adhesive use limited   cleansing with dimethicone incontinence wipes   drying agents applied  Taken 02/13/2022 0720 by Jimmy Picket, RN  Skin Protection:   adhesive use limited   cleansing with dimethicone incontinence wipes   drying agents applied  Intervention: Prevent and Manage VTE (Venous Thromboembolism) Risk  Recent Flowsheet Documentation  Taken 02/13/2022 0720 by Jimmy Picket, RN  Activity Management: activity adjusted per tolerance  Intervention: Prevent Infection  Recent Flowsheet Documentation  Taken 02/13/2022 0720 by Jimmy Picket, RN  Infection Prevention:   hand hygiene promoted   personal protective equipment utilized   single patient room provided   rest/sleep promoted  Goal: Optimal Comfort and Wellbeing  Outcome: Ongoing - Unchanged  Goal: Readiness for Transition of Care  Outcome: Ongoing - Unchanged  Goal: Rounds/Family Conference  Outcome: Ongoing - Unchanged     Problem: Infection  Goal: Absence of Infection Signs and Symptoms  Outcome: Ongoing - Unchanged  Intervention: Prevent or Manage Infection  Recent Flowsheet Documentation  Taken 02/13/2022 0720 by Jimmy Picket, RN  Infection Management: aseptic technique maintained  Isolation Precautions: contact precautions maintained     Problem: Impaired Wound Healing  Goal: Optimal Wound Healing  Outcome: Ongoing - Unchanged  Intervention: Promote Wound Healing  Recent Flowsheet Documentation  Taken 02/13/2022 0720 by Jimmy Picket, RN  Activity Management: activity adjusted per tolerance     Problem: Fall Injury Risk  Goal: Absence of Fall and Fall-Related Injury  Outcome: Ongoing - Unchanged  Intervention: Promote Injury-Free Environment  Recent Flowsheet Documentation  Taken 02/13/2022 0720 by Jimmy Picket, RN  Safety Interventions:   low bed   lighting adjusted for tasks/safety   bed alarm   fall reduction program maintained   family at bedside   infection management   room near unit station   nonskid shoes/slippers when out of bed

## 2022-02-14 NOTE — Unmapped (Signed)
Econsult placed for peri-op management but patient is already post-op at this point.   Will disregard.

## 2022-02-14 NOTE — Unmapped (Signed)
Hospital Medicine Daily Progress Note    Assessment/Plan:    Principal Problem:    Cystic fibrosis exacerbation (CMS-HCC)  Active Problems:    Essential hypertension    Depressive disorder    Mood disorder (CMS-HCC)    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)    Chronic pansinusitis    Pancreatic insufficiency due to cystic fibrosis (CMS-HCC)    Bronchiectasis (CMS-HCC)    Chronic deep vein thrombosis (DVT) of lower extremity (CMS-HCC)  Resolved Problems:    * No resolved hospital problems. *                 Christian Young is a 36 y.o. male that presented to [facility] with Cystic fibrosis exacerbation (CMS-HCC).    Cystic fibrosis exacerbation: Patient presented to the ED with worsening fatigue, dyspnea and increased sputum production in the setting of recent rhinovirus for which he had been started on a course of PO Levaquin and 1 month of inhaled colistin. On admission,  RVP negative, CF sputum culture negative.  Chest x-ray with streaky right lower lung opacity, likely atelectasis.   -Pulmonology following  -Continue IV Zosyn, anticipate a 2-week course (6/27-7/10) with final duration based on inflammatory markers, clinical response, and repeat PFTs today, 7/5 (6/27-7/10)  -Continue inhaled colistin x28 days (6/15-7/12)  -albuterol nebs, 7% hypertonic saline, chest vest 4x daily for airway clearance  -Continue home Trikafta, Pulmozyme, Breo Ellipta  -Weekly CRP/ESR, CBC w/ diff, CMP     CF related sinus disease: Repeat CT sinus this admission showed near complete opacification of the bilateral maxillary sinuses, although the ostiomeatal units are patent bilaterally. S/P revision sinus surgery on 6/30. Finger cots and Doyle splints removed per ENT on 7/3.  -ENT following, recs as follows:  - Nasal saline irrigations BID starting POD1. Can use a 60 mL Toomey syringe filled with sterile saline. Lean over the sink and irrigate each nostril. If not tolerating can do nasal saline sprays (3-4 sprays in each nare QID)  -Discussed with ENT, and will resume prophylactic enoxaparin today, 7/5  -will need at least 5 days of antibiotics post-operatively  -see below for heme recs for anticoagulation  -Continue home Flonase, Claritin, Singulair  -Acute pain post op, managed with current regimen of 5-10 mg oxycodone q4 PRN     CF related diabetes, poorly-controlled: A1c 11.3 on admission, up from 10.6 in March.  Uses an insulin pump at home but didn't bring it with him. During last hospitalization received Lantus 45 units nightly and lispro 20 units TID AC.  Per endocrinology, secondary to current insulin requirement, anticipate patient will need subcutaneous insulin with pens at discharge.  -Appreciate endocrinology consultation  -Continue previous regimen of Lantus 50 U at bedtime, lispro 36 U TID AC, SSI   -Outpatient endocrinology follow-up     History of DVT:  Hematology consulted given upcoming surgery in setting of unclear clotting history. They recommend transitioning to prophylactic anticoagulation with outpatient follow up for long-term anticoagulation planning.   -Resume prophylactic enoxaparin today, 7/5, observe for 1 day after starting  -At discharge, will resume anticoagulation at prophylactic dosing with rivoroxiban 10 mg daily (was previously on 20 mg daily)  -Outpatient hematology follow-up     CF related pancreatic insufficiency:  -Continue home Creon     Mood disorder:   -Continue home fluoxetine, amitriptyline, lamotrigine, trazodone     Hypertension: Blood pressure currently stable.  -Continue home lisinopril     GERD:  -Continue formulary equivalent of  home PPI     Restless leg syndrome:   -Continue home Mirapex     Chronic back pain: Patient reports he is no longer taking gabapentin.     Elevated liver enzymes (resolved): Has had mild elevations in the past which may be related to fatty liver disease. LFTs currently stable.     Dispo: Pending final IV antibiotic plan.  Patient may be a candidate for home IV antibiotics.  Please see the note from Dr. [name], primary pulmonologist, dated 6/27 for details of care coordination prior to consideration of home infusion.     I personally spent 20 minutes face-to-face and non-face-to-face in the care of this patient, which includes all pre, intra, and post visit time on the date of service.  All documented time was specific to the E/M visit and does not include any procedures that may have been performed.     ___________________________________________________________________  Subjective:  Patient seen and examined.  No acute events overnight.  Patient sitting upright in recliner chair watching television.  Appears in no respiratory distress.  Denies any shortness of breath at rest, or DOE.  Denies any chest discomfort.  Reports continued facial pain, that he states is currently a 6 out of 10 on the pain scale.  States that the oxycodone has been beneficial.  Denies any nausea, vomiting, abdominal pain, or diarrhea.  Patient appears in no acute distress.    Labs/Studies:  Labs and Studies from the last 24hrs per EMR and Reviewed    Objective:  Temp:  [36.8 ??C (98.2 ??F)-36.9 ??C (98.4 ??F)] 36.9 ??C (98.4 ??F)  Heart Rate:  [84-116] 114  Resp:  [18-20] 20  BP: (104-125)/(65-88) 125/85  SpO2:  [96 %-100 %] 96 %    General: awake, alert.  No acute distress.  Lying in bed watching television.  Skin: Warm, dry, and intact, without obvious rashes or lesions on clothed exam. Appropriate color for ethnicity.   Head: Normocephalic   Eyes: Conjunctivae clear without exudates or hemorrhage. Eyelids normal in appearance without swelling or lesions.  Cardiac: Heart rate and rhythm normal, no edema in extremities, 2+ radial pulses bilaterally  Psychiatric: appropriate mood and pleasant affect  Neurological: Awake, alert with normal speech.  No gross neurological deficits.  Spontaneously moving all 4 extremities.  GU: Deferred  GI: Abdomen soft and symmetric without distention, bowel sounds present and normoactive in all four quadrants. No wincing or grimacing with palpation  Respiratory: chest wall symmetric and without deformity, no respiratory distress, lung sounds CTA, diminished at the bases bilaterally

## 2022-02-14 NOTE — Unmapped (Signed)
Cystic Fibrosis Nutrition Assessment    Inpatient, In-person: Nutrition Risk Screening this admission for CF and related follow up  Primary Pulmonary Provider: Dr [name]  ===================================================================  Christian Young is a 36 y.o. male w/ PMH significant for cystic fibrosis, pancreatic insufficiency, diabetes who presented on 6/27 for CF exacerbation. Plan for sinus surgery 6/30.    7/5: Christian Young remains admitted s/p sinus surgery; plan for repeat PFTs today. On High Calorie High Protein diet, consuming 75-100% of provided meals per [employer]. In good spirits today - denies any needs. Ordering lunch at time of visit.  ===================================================================  INTERVENTION:    1. Continue HCHP diet    2. Typically recommend 5mg  Vit K 2x weekly while on IV abx; defer to team given anticoagulation and hx of DVT    3. Suggest changing snack dose of enzymes to Creon 36,000 x 4 caps  - continue meal dose of Creon 36,000 x 8 caps    4. Ordered fruit cups and pretzels in computrition per pt request    5. Continue remainder of nutrition regimen:  - bowel regimen as ordered  - insulin regimen per endocrinology  - MVW gel cap BID  - acid reducer; protonix      Inpatient:   Will follow up with patient per protocol: 1-2 times per week (and more frequent as indicated)  Monitor for potential discharge needs with multi-disciplinary team.   ===================================================================  ASSESSMENT:  Nutrition Category = Adult Obesity Class 1: BMI 30 to < 35 kg/m2    Estimated daily needs: 3284 kcal/day, 136-181 g PRO/day, 3368 mL fluid/day      Calories estimated using: Cystic Fibrosis Conference Formula, protein per DRI x 1.5-2, fluid per [facility]    Current diet is not appropriate for CF. Current PO intake is adequate to meet estimated CF needs. Patient continues to work towards goals for weight management.   Patient would benefit from adjustment in enzyme regimen. Vitamin prescription is appropriate to reach/maintain optimal fat soluble vitamin levels. Patient on CFTR modulator is consuming adequate amounts of fat-containing foods with prescribed medication to optimize absorption.    ASPEN/AND Malnutrition Screening:  Patient does not meet ASPEN/AND criteria for malnutrition at this time.    Goals:  1. Ongoing:  Meet estimated daily needs  2. Ongoing:  Reach/maintain established anthropometric goals for Adult CF: BMI < 30 kg/m2   3. Ongoing:  Normal fat-soluble vitamin levels: Vitamin A, Vitamin E and PT per lab range; Vitamin D 25OH total >30   4. Ongoing:  Maintain glucose control. Carbohydrate content of diet should comprise 40-50% of total calorie needs, but carbohydrates are not restricted in this population.    5. Ongoing:  Meet sodium needs for CF     Nutrition goals reviewed, and relevant barriers identified and addressed: none evident.   Patient is evaluated to have good  willingness and ability to achieve nutrition goals.   ===================================================================  INPATIENT:  Christian Young is admitted with CF (cystic fibrosis) (CMS-HCC) [E84.9]  Pneumonia of right lung due to infectious organism, unspecified part of lung [J18.9].    Current Nutrition Orders (inpatient):  Oral intake     CF Nutrition related medications (inpatient): Nutritionally relevant medications reviewed.   lipitor  trikafta  Insulin regimen per endo  Creon 36,000 (home supply) x 8 caps/meal  Creon 12,000 x 4 caps/snack  protonix  MVW complete formulation gel cap BID  IV abx  Miralax once daily    CF Nutrition related  labs (inpatient): reviewed; PT WNL  ==================================================================  CLINICAL DATA:  Past Medical History:   Diagnosis Date   ??? Anxiety    ??? Asthma    ??? Chronic pain disorder    ??? Cystic fibrosis (CMS-HCC)    ??? Depression    ??? Diabetes mellitus (CMS-HCC)    ??? Hypertension    ??? Lumbar radiculopathy 10/26/2020   ??? Nonproductive cough 04/05/2018     Patient Active Problem List   Diagnosis   ??? Essential hypertension   ??? Depressive disorder   ??? Mood disorder (CMS-HCC)   ??? Anxiety   ??? 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 Chronic pansinusitis   ??? Pancreatic insufficiency due to cystic fibrosis (CMS-HCC)   ??? History of Mycobacterium abscessus infection   ??? Bronchiectasis (CMS-HCC)   ??? Chronic deep vein thrombosis (DVT) of lower extremity (CMS-HCC)   ??? Cystic fibrosis exacerbation (CMS-HCC)   ??? Obesity (BMI 30-39.9)   ??? Restless leg syndrome   ??? Degeneration of lumbar intervertebral disc   ??? Lumbar radiculopathy   ??? Idiopathic peripheral neuropathy   ??? Long term current use of anticoagulant therapy   ??? Acute non-recurrent pansinusitis   ??? AKI (acute kidney injury) (CMS-HCC)   ??? Hemoptysis   ??? Fracture of multiple ribs of left side     Anthroprometric Evaluation:  Weight changes: Christian Young reports he last weighed 246 lbs at his PCP office a few weeks ago. Per EPIC documentation, last weighed that amount several months ago. Weight largely stable since March.  CFTR modulator and weight change: On Trikafta  BMI Readings from Last 3 Encounters:   02/07/22 31.99 kg/m??   01/25/22 32.14 kg/m??   01/25/22 32.14 kg/m??     Wt Readings from Last 10 Encounters:   02/07/22 (!) 107 kg (235 lb 14.3 oz)   01/25/22 (!) 107.5 kg (237 lb)   01/25/22 (!) 107.5 kg (237 lb)   11/03/21 (!) 108.2 kg (238 lb 8.6 oz)   09/09/21 (!) 110.8 kg (244 lb 4.3 oz)   07/30/21 (!) 111.1 kg (245 lb)   06/08/21 (!) 113.4 kg (250 lb)   03/14/21 (!) 111.2 kg (245 lb 1.6 oz)   03/09/21 (!) 109 kg (240 lb 3.2 oz)   02/06/21 (!) 103.4 kg (228 lb)     Ht Readings from Last 3 Encounters:   02/06/22 182.9 cm (6' 0.01)   01/25/22 182.9 cm (6')   11/03/21 182.9 cm (6' 0.01)     ==================================================================  Energy Intake (outpatient):  Diet: Christian Young endorses fluctuating appetite, which is typical for him during exacerbations. Drinking well with no GI concerns. Requested fruit cups and pretzels.    Allergies, Intolerances, Sensitivities, and/or Cultural/Religious Dietary Restrictions:  include banana  Sodium in diet:  historically adequate  Calcium in diet:   historically adequate  Food Insecurity:   Food Insecurity: Food Insecurity Present   ??? Worried About Programme researcher, broadcasting/film/video in the Last Year: Never true   ??? Ran Out of Food in the Last Year: Sometimes true   CFTR modulator and Diet: Prescribed Trikafta (elexacaftor/tezacaftor/ivacaftor).  PO Supplements: none  Patient resources for DME/formula:  -none  Appetite Stimulant: none  Enteral feeding tube: none  Physical activity: limited recently d/t back pain; not discussed today    GI/Malabsorption (outpatient):  Enzyme brand, (meals/snacks):  Creon 36,000 @ 8/meal and 4/snack  Enzyme administration details: correct pre-meal administration., good compliance at all meals and snacks  Enzyme dose per  MEAL (units lipase/kg/meal) 2691  Enzyme dose per DAY (units lipase/kg/day) 13447  GI meds:  Nutritionally relevant medications reviewed. Miralax, colace, probiotic, prilosec  Stools: last documented BM yesterday  Fecal Fat Studies: insufficient  No results found for: [id number]  Lab Results   Component Value Date    ELAST <15 (L) 03/19/2017     No results found for: PELAI    Vitamins/Minerals (outpatient):  CF-specific MVI, dose, compliance: MVW Complete Formulation Softgel regular 2 daily, good compliance  Other vitamins/minerals/herbals: none  Patient Resources for vitamins: [employer]  phone [phone]. Active through September 2023.  Calcium supplement: none  Fat-soluble vitamin levels:   Lab Results   Component Value Date    VITAMINA 40.8 11/15/2021    VITAMINA 58.7 02/06/2021    VITAMINA 42.4 01/18/2020     Lab Results   Component Value Date    CRP 7.0 02/12/2022    CRP <4.0 02/06/2022    CRP 5.0 01/25/2022     Lab Results Component Value Date    VITDTOTAL 31.1 11/15/2021    VITDTOTAL 38.2 02/06/2021    VITDTOTAL 27.9 03/19/2017     Lab Results   Component Value Date    VITAME 10.0 11/15/2021    VITAME 16.0 02/06/2021     Lab Results   Component Value Date    PT 12.0 02/07/2022    PT 13.3 (H) 11/13/2021    PT 23.6 (H) 11/09/2021     Lab Results   Component Value Date    DESGCARBPT 0.2 11/09/2021    DESGCARBPT 0.2 03/20/2017     No results found for: PIVKAII    Bone Health: Normal vitamin D level. Adequate calcium intake from diet. Normal bone density on DEXA 02/2017. Due for repeat.    CF Related Diabetes: yes; on insulin with insulin pump. Followed by endocrinology. Rising HbA1c.  Lab Results   Component Value Date    GLUF 178 (H) 01/18/2020     No results found for: GLUCOSE2HR  Lab Results   Component Value Date    A1C 11.3 (H) 02/07/2022    A1C 10.6 (H) 11/03/2021    A1C 9.0 (H) 06/08/2021       Jackqulyn Livings MPH, RD, LDN  Pager: [phone]

## 2022-02-15 MED ORDER — INSULIN ASPART (U-100) 100 UNIT/ML (3 ML) SUBCUTANEOUS PEN
Freq: Four times a day (QID) | SUBCUTANEOUS | 4 refills | 30.00000 days | Status: CP
Start: 2022-02-15 — End: 2022-07-15

## 2022-02-15 MED ORDER — SODIUM CHLORIDE, SODIUM BICARB-NASAL RINSE SQUEEZE BOTTLE WITH PACKET
PACK | Freq: Two times a day (BID) | NASAL | 0 refills | 30 days | Status: CP
Start: 2022-02-15 — End: 2022-03-17

## 2022-02-15 MED ORDER — INSULIN GLARGINE (U-100) 100 UNIT/ML (3 ML) SUBCUTANEOUS PEN
Freq: Every evening | SUBCUTANEOUS | 3 refills | 30 days | Status: CP
Start: 2022-02-15 — End: 2022-06-15

## 2022-02-15 MED ORDER — OXYCODONE 5 MG TABLET
ORAL_TABLET | ORAL | 0 refills | 2 days | Status: CP | PRN
Start: 2022-02-15 — End: 2022-02-20

## 2022-02-15 MED ORDER — RIVAROXABAN 10 MG TABLET
ORAL_TABLET | Freq: Every day | ORAL | 2 refills | 90 days | Status: CP
Start: 2022-02-15 — End: 2022-11-12

## 2022-02-15 MED ORDER — PEN NEEDLE, DIABETIC 32 GAUGE X 5/32" (4 MM)
5 refills | 0 days | Status: CP
Start: 2022-02-15 — End: ?

## 2022-02-15 MED ADMIN — piperacillin-tazobactam (ZOSYN) IVPB (premix) 4.5 g: 4.5 g | INTRAVENOUS | @ 10:00:00 | Stop: 2022-02-15

## 2022-02-15 MED ADMIN — insulin lispro (HumaLOG) injection 0-20 Units: 0-20 [IU] | SUBCUTANEOUS | @ 17:00:00 | Stop: 2022-02-15

## 2022-02-15 MED ADMIN — colistimethate (COLYMYCIN) 150 mg in sodium chloride (NS) 4 mL inhalation: 150 mg | RESPIRATORY_TRACT | @ 01:00:00

## 2022-02-15 MED ADMIN — senna (SENOKOT) tablet 2 tablet: 2 | ORAL

## 2022-02-15 MED ADMIN — lipase-protease-amylase (CREON) 36,000-114,000- 180,000 unit capsule 288,000 units of lipase - Patient Supplied: 8 | ORAL | @ 21:00:00 | Stop: 2022-02-15

## 2022-02-15 MED ADMIN — oxyCODONE (ROXICODONE) immediate release tablet 10 mg: 10 mg | ORAL | Stop: 2022-02-24

## 2022-02-15 MED ADMIN — lipase-protease-amylase (CREON) 36,000-114,000- 180,000 unit capsule 288,000 units of lipase - Patient Supplied: 8 | ORAL | @ 17:00:00 | Stop: 2022-02-15

## 2022-02-15 MED ADMIN — sod chlor-bicarb-squeez bottle (NEILMED) nasal packet 1 packet: 1 | NASAL | Stop: 2022-02-20

## 2022-02-15 MED ADMIN — sod chlor-bicarb-squeez bottle (NEILMED) nasal packet 1 packet: 1 | NASAL | @ 13:00:00 | Stop: 2022-02-15

## 2022-02-15 MED ADMIN — elexacaftor-tezacaftor-ivacaft (TRIKAFTA) tablet 2 tablet **Patient Supplied**: 2 | ORAL | @ 13:00:00 | Stop: 2022-02-15

## 2022-02-15 MED ADMIN — sodium chloride 7% NEBULIZER solution 4 mL: 4 mL | RESPIRATORY_TRACT

## 2022-02-15 MED ADMIN — enoxaparin (LOVENOX) syringe 40 mg: 40 mg | SUBCUTANEOUS | @ 13:00:00 | Stop: 2022-02-15

## 2022-02-15 MED ADMIN — pediatric multivitamin-vit D3 1,500 unit-vit K 800 mcg (MVW COMPLETE FORMULATION) capsule: 2 | ORAL | @ 13:00:00 | Stop: 2022-02-15

## 2022-02-15 MED ADMIN — insulin glargine (LANTUS) injection 50 Units: 50 [IU] | SUBCUTANEOUS

## 2022-02-15 MED ADMIN — traZODone (DESYREL) tablet 150 mg: 150 mg | ORAL

## 2022-02-15 MED ADMIN — lipase-protease-amylase (CREON) 36,000-114,000- 180,000 unit capsule 288,000 units of lipase - Patient Supplied: 8 | ORAL | @ 13:00:00 | Stop: 2022-02-15

## 2022-02-15 MED ADMIN — colistimethate (COLYMYCIN) 150 mg in sodium chloride (NS) 4 mL inhalation: 150 mg | RESPIRATORY_TRACT | @ 13:00:00 | Stop: 2022-02-15

## 2022-02-15 MED ADMIN — dornase alfa (PULMOZYME) 1 mg/mL solution 2.5 mg: 2.5 mg | RESPIRATORY_TRACT | @ 13:00:00 | Stop: 2022-02-15

## 2022-02-15 MED ADMIN — montelukast (SINGULAIR) tablet 10 mg: 10 mg | ORAL

## 2022-02-15 MED ADMIN — insulin lispro (HumaLOG) injection 0-20 Units: 0-20 [IU] | SUBCUTANEOUS | @ 21:00:00 | Stop: 2022-02-15

## 2022-02-15 MED ADMIN — amitriptyline (ELAVIL) tablet 100 mg: 100 mg | ORAL

## 2022-02-15 MED ADMIN — loratadine (CLARITIN) tablet 10 mg: 10 mg | ORAL | @ 13:00:00 | Stop: 2022-02-15

## 2022-02-15 MED ADMIN — lamoTRIgine (LaMICtal) tablet 400 mg: 400 mg | ORAL | @ 13:00:00 | Stop: 2022-02-15

## 2022-02-15 MED ADMIN — atorvastatin (LIPITOR) tablet 40 mg: 40 mg | ORAL

## 2022-02-15 MED ADMIN — insulin lispro (HumaLOG) injection 40 Units: 40 [IU] | SUBCUTANEOUS | @ 17:00:00 | Stop: 2022-02-15

## 2022-02-15 MED ADMIN — albuterol 2.5 mg /3 mL (0.083 %) nebulizer solution 2.5 mg: 2.5 mg | RESPIRATORY_TRACT | @ 17:00:00 | Stop: 2022-02-15

## 2022-02-15 MED ADMIN — albuterol 2.5 mg /3 mL (0.083 %) nebulizer solution 2.5 mg: 2.5 mg | RESPIRATORY_TRACT | @ 20:00:00 | Stop: 2022-02-15

## 2022-02-15 MED ADMIN — piperacillin-tazobactam (ZOSYN) IVPB (premix) 4.5 g: 4.5 g | INTRAVENOUS | @ 21:00:00 | Stop: 2022-02-15

## 2022-02-15 MED ADMIN — sodium chloride 7% NEBULIZER solution 4 mL: 4 mL | RESPIRATORY_TRACT | @ 13:00:00 | Stop: 2022-02-15

## 2022-02-15 MED ADMIN — elexacaftor-tezacaftor-ivacaft (TRIKAFTA) tablet 1 tablet **Patient Supplied**: 1 | ORAL | @ 21:00:00 | Stop: 2022-02-15

## 2022-02-15 MED ADMIN — piperacillin-tazobactam (ZOSYN) IVPB (premix) 4.5 g: 4.5 g | INTRAVENOUS | @ 03:00:00 | Stop: 2022-02-20

## 2022-02-15 MED ADMIN — albuterol 2.5 mg /3 mL (0.083 %) nebulizer solution 2.5 mg: 2.5 mg | RESPIRATORY_TRACT

## 2022-02-15 MED ADMIN — sodium chloride 7% NEBULIZER solution 4 mL: 4 mL | RESPIRATORY_TRACT | @ 17:00:00 | Stop: 2022-02-15

## 2022-02-15 MED ADMIN — insulin lispro (HumaLOG) injection 40 Units: 40 [IU] | SUBCUTANEOUS | @ 13:00:00 | Stop: 2022-02-15

## 2022-02-15 MED ADMIN — albuterol 2.5 mg /3 mL (0.083 %) nebulizer solution 2.5 mg: 2.5 mg | RESPIRATORY_TRACT | @ 13:00:00 | Stop: 2022-02-15

## 2022-02-15 MED ADMIN — pantoprazole (PROTONIX) EC tablet 20 mg: 20 mg | ORAL | @ 13:00:00 | Stop: 2022-02-15

## 2022-02-15 MED ADMIN — fluticasone furoate-vilanteroL (BREO ELLIPTA) 200-25 mcg/dose inhaler 1 puff: 1 | RESPIRATORY_TRACT | @ 13:00:00 | Stop: 2022-02-15

## 2022-02-15 MED ADMIN — sodium chloride 7% NEBULIZER solution 4 mL: 4 mL | RESPIRATORY_TRACT | @ 20:00:00 | Stop: 2022-02-15

## 2022-02-15 MED ADMIN — piperacillin-tazobactam (ZOSYN) IVPB (premix) 4.5 g: 4.5 g | INTRAVENOUS | @ 17:00:00 | Stop: 2022-02-15

## 2022-02-15 MED ADMIN — fluticasone propionate (FLONASE) 50 mcg/actuation nasal spray 2 spray: 2 | NASAL | @ 13:00:00 | Stop: 2022-02-15

## 2022-02-15 MED ADMIN — pramipexole (MIRAPEX) tablet 0.25 mg: .25 mg | ORAL

## 2022-02-15 MED ADMIN — insulin lispro (HumaLOG) injection 0-20 Units: 0-20 [IU] | SUBCUTANEOUS | @ 13:00:00 | Stop: 2022-02-15

## 2022-02-15 MED ADMIN — lisinopriL (PRINIVIL,ZESTRIL) tablet 10 mg: 10 mg | ORAL | @ 13:00:00 | Stop: 2022-02-15

## 2022-02-15 MED ADMIN — FLUoxetine (PROzac) capsule 60 mg: 60 mg | ORAL | @ 13:00:00 | Stop: 2022-02-15

## 2022-02-15 NOTE — Unmapped (Signed)
Problem: Impaired Wound Healing  Goal: Optimal Wound Healing  Outcome: Ongoing - Unchanged     Problem: Fall Injury Risk  Goal: Absence of Fall and Fall-Related Injury  Outcome: Ongoing - Unchanged     Problem: Adult Inpatient Plan of Care  Goal: Plan of Care Review  Outcome: Progressing  Goal: Patient-Specific Goal (Individualized)  Outcome: Progressing  Goal: Absence of Hospital-Acquired Illness or Injury  Outcome: Progressing  Goal: Optimal Comfort and Wellbeing  Outcome: Progressing  Goal: Readiness for Transition of Care  Outcome: Progressing  Goal: Rounds/Family Conference  Outcome: Progressing     Problem: Infection  Goal: Absence of Infection Signs and Symptoms  Outcome: Progressing

## 2022-02-15 NOTE — Unmapped (Signed)
A&Ox4, VSS, afebrile, free from falls/injuries. Denies NVD but c/o pain at nasal region; oxy x1 PRN given w/ moderate relief. Senokot x1 PRN given. Scheduled medicines given. Falls precaution taken. No acute events this shift. Pt expecting to dc after last dose of zosyn at 1800 today.     Vitals:    02/14/22 0835 02/14/22 1152 02/14/22 2003 02/15/22 0411   BP: 126/93 115/85 131/87 118/82   Pulse: 105 108 112 92   Resp:  16 18 18    Temp: 36.8 ??C (98.2 ??F) 36.4 ??C (97.5 ??F) 37.2 ??C (99 ??F) 36.7 ??C (98.1 ??F)   TempSrc: Axillary  Oral Oral   SpO2: 100% 99% 97% 97%   Weight:       Height:             Problem: Adult Inpatient Plan of Care  Goal: Plan of Care Review  02/15/2022 0511 by Georgia Duff, RN  Outcome: Progressing  02/15/2022 0511 by Georgia Duff, RN  Outcome: Progressing  Goal: Patient-Specific Goal (Individualized)  02/15/2022 0511 by Georgia Duff, RN  Outcome: Progressing  02/15/2022 0511 by Georgia Duff, RN  Outcome: Progressing  Goal: Absence of Hospital-Acquired Illness or Injury  02/15/2022 0511 by Georgia Duff, RN  Outcome: Progressing  02/15/2022 0511 by Georgia Duff, RN  Outcome: Progressing  Intervention: Identify and Manage Fall Risk  Recent Flowsheet Documentation  Taken 02/14/2022 1920 by Georgia Duff, RN  Safety Interventions:   lighting adjusted for tasks/safety   low bed   fall reduction program maintained   nonskid shoes/slippers when out of bed  Intervention: Prevent Infection  Recent Flowsheet Documentation  Taken 02/14/2022 1920 by Georgia Duff, RN  Infection Prevention: hand hygiene promoted  Goal: Optimal Comfort and Wellbeing  02/15/2022 0511 by Georgia Duff, RN  Outcome: Progressing  02/15/2022 0511 by Georgia Duff, RN  Outcome: Progressing  Goal: Readiness for Transition of Care  02/15/2022 0511 by Georgia Duff, RN  Outcome: Progressing  02/15/2022 0511 by Georgia Duff, RN  Outcome: Progressing  Goal: Rounds/Family Conference  02/15/2022 0511 by Georgia Duff, RN  Outcome: Progressing  02/15/2022 0511 by Georgia Duff, RN  Outcome: Progressing     Problem: Infection  Goal: Absence of Infection Signs and Symptoms  02/15/2022 0511 by Georgia Duff, RN  Outcome: Progressing  02/15/2022 0511 by Georgia Duff, RN  Outcome: Progressing  Intervention: Prevent or Manage Infection  Recent Flowsheet Documentation  Taken 02/14/2022 1920 by Georgia Duff, RN  Infection Management: aseptic technique maintained  Isolation Precautions: contact precautions maintained     Problem: Impaired Wound Healing  Goal: Optimal Wound Healing  02/15/2022 0511 by Georgia Duff, RN  Outcome: Progressing  02/15/2022 0511 by Georgia Duff, RN  Outcome: Progressing     Problem: Fall Injury Risk  Goal: Absence of Fall and Fall-Related Injury  02/15/2022 0511 by Georgia Duff, RN  Outcome: Progressing  02/15/2022 0511 by Georgia Duff, RN  Outcome: Progressing  Intervention: Promote Injury-Free Environment  Recent Flowsheet Documentation  Taken 02/14/2022 1920 by Georgia Duff, RN  Safety Interventions:   lighting adjusted for tasks/safety   low bed   fall reduction program maintained   nonskid shoes/slippers when out of bed

## 2022-02-15 NOTE — Unmapped (Cosign Needed)
Pulmonary Consult Service  Follow-Up Note      Primary Service:   Hospitalist- [place]  Primary Service Attending:  Jeanene Erb, FNP  Reason for Consult:   Cystic fibrosis    IMPRESSION and RECOMMENDATIONS     Christian Young is a 36 y.o. male with a history of cystic fibrosis (S945L and [phone] insertion) on elexacaftor/tezacaftor/ivacaftor with pulmonary, sinus, pancreatic, and gastrointestinal manifestations who presents with a cystic fibrosis flare and sinus infection. Follows w/ Dr. Audrea Young. PFTs with a drop in his FEV1 of over 10%.  Pulmonary is following for management of CF.    PFTs today show improved FEV1 and FVC though not recovered to his recent best performance on 01/25/22. Symptomatically, he feels much improved which is reassuring. Ideally, would stay for full 14 day course but given his desire to go to the beach (which we support) will plan for 10 days of IV zosyn + an oral regimen on discharge as outlined below.     #Cystic fibrosis exacerbation  - Continue IV piperacillin/tazobactam (06/27- ) for 10 days (last dose 02/15/22 at 1800)   - On discharge, recommend 4 additional days of anti-pseudomonal dosed Levaquin (750mg  q 24 hours starting 02/16/22)  - Continue inhaled colistin (06/15- ) for planned 28-day course. Will need this on discharge to complete his month long course.   - Continue airway clearance with 7% hypertonic saline and albuterol pretreatment 4 times daily; continue mechanical airway clearance with chest vest or any device at patient's preference  -Trend CRP and ESR weekly  - f/u sinus cultures (NGTD)  - Prior CF cultures w/ mucoid & smooth PSA, +carbapenem & fluoroquinolone resistance  - We are okay with discharge after his last dose of Zosyn on 02/15/22     #Cystic fibrosis-related sinus disease  - S/p sinus surgery with ENT on 06/30    #Cystic fibrosis-related pancreatic insufficiency diabetes  - Agree with endocrine consult    #Cystic fibrosis-related constipation  - Agree with bowel regimen    Christian Young was seen, examined and discussed with  Dr. Hoyle Barr  Thank you for involving [name] in his care. We look forward to following with you.  Please don't hesitate to page [name] with any questions or concerns at [phone] (pulmonary consult fellow).    Joyice Faster MD, PharmD   Pulmonary and Critical Care Fellow   Pager: [phone]    02/14/2022    SUBJECTIVE:     Feeling better. Ambulating without issue in his room. Nearing his baseline. Wants to discharge Thursday night so he can go to [place] on Friday AM. He is really looking forward to this beach trip.     Medications:     Scheduled Meds:   albuterol  2.5 mg Nebulization 4x Daily (RT)    amitriptyline  100 mg Oral Nightly    atorvastatin  40 mg Oral Nightly    colistimetate (COLISTIN) inhalation  150 mg Inhalation Q12H SCH    dornase alfa  2.5 mg Inhalation Daily    elexacaftor-tezacaftor-ivacaft  2 tablet Oral Daily    And    elexacaftor-tezacaftor-ivacaft  1 tablet Oral Q PM    enoxaparin (LOVENOX) injection  40 mg Subcutaneous Q24H SCH    FLUoxetine  60 mg Oral Daily    fluticasone furoate-vilanteroL  1 puff Inhalation Daily (RT)    fluticasone propionate  2 spray Each Nare Daily    insulin glargine  50 Units Subcutaneous Nightly    insulin lispro  0-20  Units Subcutaneous ACHS    insulin lispro  40 Units Subcutaneous 3xd Meals    lamoTRIgine  400 mg Oral Daily    lipase-protease-amylase  8 capsule Oral 3xd Meals    lisinopriL  10 mg Oral Daily    loratadine  10 mg Oral Daily    montelukast  10 mg Oral Nightly    pantoprazole  20 mg Oral Daily    MVW Complete (pediatric multivit 61-D3-vit K)  2 capsule Oral Daily    piperacillin-tazobactam (ZOSYN) IV (intermittent)  4.5 g Intravenous Q6H    polyethylene glycol  17 g Oral Daily    pramipexole  0.25 mg Oral Nightly    sod chlor-bicarb-squeez bottle  1 packet Each Nare BID    sodium chloride 7%  4 mL Nebulization 4x Daily (RT)    traZODone  150 mg Oral Nightly     Continuous Infusions:  PRN Meds:.aluminum-magnesium hydroxide-simethicone, dextrose, glucagon, glucose, guaiFENesin, oxyCODONE **OR** oxyCODONE, pancrelipase (Lip-Prot-Amyl), senna     PHYSICAL EXAM:   BP 115/85  - Pulse 108  - Temp 36.4 ??C (97.5 ??F)  - Resp 16  - Ht 182.9 cm (6' 0.01)  - Wt (!) 107 kg (235 lb 14.3 oz)  - SpO2 99%  - BMI 31.99 kg/m??   General: Alert and oriented, no acute distress  CV: RRR, no m/r/g  Lungs: no increased work of breathing, no conversational dyspnea.   Ext: Warm, well perfused, no peripheral edema  Skin: No rashes  Neuro: No focal deficits    LABORATORY and RADIOLOGY DATA:     Pertinent Laboratory Data from the last 24 hours:  Lab Results   Component Value Date    WBC 7.1 02/12/2022    HGB 11.6 (L) 02/12/2022    HCT 34.6 (L) 02/12/2022    PLT 252 02/12/2022     Lab Results   Component Value Date    NA 140 02/12/2022    K 3.8 02/12/2022    CL 105 02/12/2022    CO2 28.0 02/12/2022    BUN 7 (L) 02/12/2022    CREATININE 0.65 02/12/2022    GLU 195 (H) 02/12/2022    CALCIUM 8.6 (L) 02/12/2022    MG 1.8 02/12/2022    PHOS 3.9 02/06/2022       Lab Results   Component Value Date    BILITOT 0.3 02/12/2022    BILIDIR 0.20 02/06/2022    PROT 6.4 02/12/2022    ALBUMIN 3.6 02/12/2022    ALT 53 (H) 02/12/2022    AST 27 02/12/2022    ALKPHOS 80 02/12/2022       Lab Results   Component Value Date    LABPROT 11.8 01/18/2020    INR 1.06 02/07/2022    APTT 31.6 11/03/2021       Pertinent Micro Data:  Blood Culture, Routine (no units)   Date Value   02/06/2022 No Growth at 5 days   02/06/2022 No Growth at 5 days     WBC (10*9/L)   Date Value   02/12/2022 7.1          Pertinent Imaging Data from the last 24 hours:  Reviewed in [facility]

## 2022-02-15 NOTE — Unmapped (Signed)
Patient did well with doing all of their treatments today. Patient did their airway clearance without complications. Will continue to monitor patient.

## 2022-02-15 NOTE — Unmapped (Signed)
Otolaryngology Daily Progress Note    Assessment/Plan:  36 year old male with history of asthma, diabetes, hypertension, cystic fibrosis and chronic rhinosinusitis who was transferred for admission due to cystic fibrosis exacerbation, now s/p revision FESS 6/30    - Recommend continued IV antibiotics  - Nasal saline irrigations BID  Can use a 60 mL [place] syringe filled with sterile saline. Lean over the sink and irrigate each nostril. If not tolerating can do nasal saline sprays (3-4 sprays in each nare QID)  - Ok to discharge from ENT standpoint, will plan for outpatient follow up in 2 weeks    - Please reach out to the ENT team with questions, concerns, or changes in clinical status    Subjective:  NAEON. Pain controlled.  Doing well, no drainage since anticoagulation restarted.    Objective:    Vital signs in last 24 hours:  Temp:  [36.4 ??C (97.5 ??F)-37.2 ??C (99 ??F)] 36.7 ??C (98.1 ??F)  Heart Rate:  [92-112] 92  Resp:  [16-18] 18  BP: (115-131)/(82-93) 118/82  MAP (mmHg):  [93-104] 93  SpO2:  [97 %-100 %] 97 %    Intake/Output last 3 shifts:  I/O last 3 completed shifts:  In: 3280 [P.O.:480; IV Piggyback:2800]  Out: -   Intake/Output this shift:  No intake/output data recorded.    Physical Exam:  General: Awake, alert, oriented sitting upright in bed in NAD.   Head and Face: NCAT; HB 1/6; mild muoid drainage bilaterally, no significant bloody drainage.  Eyes: EOMI - sclera and conjunctiva clear; able to finger count appropriately  OC/OP: clear - FOM soft and tongue protrudes midline  Neck: soft, flat  Pulm: Unlabored - no stridor/stertor

## 2022-02-15 NOTE — Unmapped (Signed)
Pulmonary Consult Service  Follow-Up Note      Primary Service:   Hospitalist- [place]  Primary Service Attending:  Jeanene Erb, FNP  Reason for Consult:   Cystic fibrosis    IMPRESSION and RECOMMENDATIONS     Christian Young is a 36 y.o. male with a history of cystic fibrosis (S945L and [phone] insertion) on elexacaftor/tezacaftor/ivacaftor with pulmonary, sinus, pancreatic, and gastrointestinal manifestations who presents with a cystic fibrosis flare and sinus infection. Follows w/ Dr. [name]. PFTs with a drop in his FEV1 of over 10%.  Pulmonary is following for management of CF.    PFTs today show improved FEV1 and FVC though not recovered to his recent best performance on 01/25/22. Symptomatically, he feels much improved which is reassuring. Ideally, would stay for full 14 day course but given his desire to go to the beach (which we support) will plan for 10 days of IV zosyn + an oral regimen on discharge as outlined below.     #Cystic fibrosis exacerbation  - Continue IV piperacillin/tazobactam (06/27- ) for 10 days (last dose 02/15/22 at 1800)   - On discharge, recommend 4 additional days of anti-pseudomonal dosed levofloxacin (750mg  q24 hours starting 02/16/22)  - Continue inhaled colistin (06/15- ) for planned 28-day course. Will need this on discharge to complete his month long course.   - Continue airway clearance with 7% hypertonic saline and albuterol pretreatment 4 times daily; continue mechanical airway clearance with chest vest or any device at patient's preference  -Trend CRP and ESR weekly  - f/u sinus cultures (NGTD)  - Prior CF cultures w/ mucoid & smooth PSA, +carbapenem & fluoroquinolone resistance  - We are okay with discharge after his last dose of Zosyn on 02/15/22     #Cystic fibrosis-related sinus disease  - S/p sinus surgery with ENT on 06/30    #Cystic fibrosis-related pancreatic insufficiency diabetes  - Agree with endocrine consult    #Cystic fibrosis-related constipation  - Agree with bowel regimen    Mr. Teater was seen, examined and discussed with  Dr. Hoyle Barr  Thank you for involving [name] in his care. We look forward to following with you.  Please don't hesitate to page [name] with any questions or concerns at [phone] (pulmonary consult fellow).    Hilda Blades, MD  [facility] Pulmonary & Critical Care Fellow, PGY-5  02/15/2022      SUBJECTIVE:     Feels well, denies complaints.     Medications:     Scheduled Meds:   albuterol  2.5 mg Nebulization 4x Daily (RT)    amitriptyline  100 mg Oral Nightly    atorvastatin  40 mg Oral Nightly    colistimetate (COLISTIN) inhalation  150 mg Inhalation Q12H SCH    dornase alfa  2.5 mg Inhalation Daily    elexacaftor-tezacaftor-ivacaft  2 tablet Oral Daily    And    elexacaftor-tezacaftor-ivacaft  1 tablet Oral Q PM    enoxaparin (LOVENOX) injection  40 mg Subcutaneous Q24H SCH    FLUoxetine  60 mg Oral Daily    fluticasone furoate-vilanteroL  1 puff Inhalation Daily (RT)    fluticasone propionate  2 spray Each Nare Daily    insulin glargine  50 Units Subcutaneous Nightly    insulin lispro  0-20 Units Subcutaneous ACHS    insulin lispro  40 Units Subcutaneous 3xd Meals    lamoTRIgine  400 mg Oral Daily    lipase-protease-amylase  8 capsule Oral 3xd Meals  lisinopriL  10 mg Oral Daily    loratadine  10 mg Oral Daily    montelukast  10 mg Oral Nightly    pantoprazole  20 mg Oral Daily    MVW Complete (pediatric multivit 61-D3-vit K)  2 capsule Oral Daily    piperacillin-tazobactam (ZOSYN) IV (intermittent)  4.5 g Intravenous Q6H    polyethylene glycol  17 g Oral Daily    pramipexole  0.25 mg Oral Nightly    sod chlor-bicarb-squeez bottle  1 packet Each Nare BID    sodium chloride 7%  4 mL Nebulization 4x Daily (RT)    traZODone  150 mg Oral Nightly     Continuous Infusions:  PRN Meds:.aluminum-magnesium hydroxide-simethicone, dextrose, glucagon, glucose, guaiFENesin, oxyCODONE **OR** oxyCODONE, pancrelipase (Lip-Prot-Amyl), senna     PHYSICAL EXAM:   BP 120/89  - Pulse 107  - Temp 36.7 ??C (98.1 ??F) (Oral)  - Resp 18  - Ht 182.9 cm (6' 0.01)  - Wt (!) 107 kg (235 lb 14.3 oz)  - SpO2 97%  - BMI 31.99 kg/m??   General: Alert and oriented, no acute distress  CV: RRR, no m/r/g  Lungs: no increased work of breathing, no conversational dyspnea.   Ext: Warm, well perfused, no peripheral edema  Skin: No rashes  Neuro: No focal deficits    LABORATORY and RADIOLOGY DATA:     Pertinent Laboratory Data from the last 24 hours:  Lab Results   Component Value Date    WBC 7.1 02/12/2022    HGB 11.6 (L) 02/12/2022    HCT 34.6 (L) 02/12/2022    PLT 252 02/12/2022     Lab Results   Component Value Date    NA 140 02/12/2022    K 3.8 02/12/2022    CL 105 02/12/2022    CO2 28.0 02/12/2022    BUN 7 (L) 02/12/2022    CREATININE 0.65 02/12/2022    GLU 195 (H) 02/12/2022    CALCIUM 8.6 (L) 02/12/2022    MG 1.8 02/12/2022    PHOS 3.9 02/06/2022       Lab Results   Component Value Date    BILITOT 0.3 02/12/2022    BILIDIR 0.20 02/06/2022    PROT 6.4 02/12/2022    ALBUMIN 3.6 02/12/2022    ALT 53 (H) 02/12/2022    AST 27 02/12/2022    ALKPHOS 80 02/12/2022       Lab Results   Component Value Date    LABPROT 11.8 01/18/2020    INR 1.06 02/07/2022    APTT 31.6 11/03/2021       Pertinent Micro Data:  Blood Culture, Routine (no units)   Date Value   02/06/2022 No Growth at 5 days   02/06/2022 No Growth at 5 days     WBC (10*9/L)   Date Value   02/12/2022 7.1          Pertinent Imaging Data from the last 24 hours:  Reviewed in [facility]

## 2022-02-15 NOTE — Unmapped (Signed)
Endocrine Team Follow Up Consult Note     Requesting Attending Physician : Jeanene Erb, FNP  Service Requesting Consult : Med Bernita [facility])  Primary Care Provider: Hal Morales, NP    IMPRESSION:  Christian Young is a 36 y.o. male admitted for CF exacerbation and revision of sinus surgery. We have been consulted at the request of Jeanene Erb, FNP to evaluate Christian Young for hyperglycemia.     MEDICAL DECISION MAKING   Diagnoses:  1.CFRD. Uncontrolled. With hyperglycemia.  2. Nutrition: Complicating glycemic control. Increasing risk for both hypoglycemia and hyperglycemia.  3. Infection. Complicating glycemic control and increasing risk for hyperglycemia.  4. Obesity. Complicating glycemic control and increasing risk for hyperglycemia.   5. Steroids. Complicating glycemic control and increasing risk for hyperglycemia. Last given 6/30, now out of system.    Data reviewed 02/15/22:  Diagnostic studies and interpretation:     POCT glucose - severe hyperglycemia after breakfast, improved by evening post lunch and dinner  Notes reviewed: Primary team and nursing notes    Overall impression based on above reviews and history:  Improved glycemic trend. Fasting near goal. Postprandial after lunch and dinner better. He will be discharging today and will plan discharge on MDI given his large insulin requirement. Will coordinate close follow up.    HOSPITAL RECOMMENDATIONS:   - Lantus 50u at bedtime   -50% if NPO   - lispro 40u TID AC   - continue lispro correctional TG 140, ISF 20  - Hypoglycemia protocol  - POCT-BG achs.  - Ensure patient is on glucose precautions.     Discharge Planning:   - Lantus 50 units nightly  - Nutritional Novolog TID AC:   - smaller than usual sized meal = 35 units   - usual sized meal = 40 units   - larger than usual sized meal = 45 units  - Correctional Novolog sliding scale ACHS:   - If blood sugar 70-150, give 0 units.   - If blood sugar 151-200, give 2 units.   - If blood sugar 201-250, give 4 units.  - If blood sugar 251-300, give 6 units.  - If blood sugar 301-350, give 8 units.  - If blood sugar >350, give 10 units and notify your endocrinologist.  - please send insulin pens with pen needles on discharge  - will plan to coordinate follow up with CDE in 1 month and in 2-3 months with Dr. Yetta Barre    Thank you for this consult. Discussed plan with primary team. We will continue to follow and make recommendations and place orders as appropriate.    Please page with questions or concerns: Endocrine Fellow: [phone]  DCT on call from 6AM - 3PM on weekdays then endocrine fellow on call: [phone] from 3PM - 6AM on weekdays and on weekends and holidays.   If APP cannot be reached, please page the endocrine fellow on call.      Subjective:  Interval hx: Today 02/15/22, no complaints today. Eating all meals in full. Going home today and feels comfortable with MDI.    Initial H&P:    Christian Young is a 36 y.o. male with PMH significant for  cystic fibrosis, essential hypertension, depressive disorder, diabetes related to cystic fibrosis, cystic fibrosis related sinus disease, pancreatic insufficiency due to cystic fibrosis, and history of DVT  who is admitted for CF exacerbation.     Pt states that he usually uses an omnipod, however, due to his insulin requirements,  the pods are only lasting a day and a half. He reports that his settings have not changed since he was last seen in clinic last summer.     Diabetes History:  Patient has a history of Cystic fibrosis related diabetes diagnosed ~2017.  Diabetes is managed by: Dr. Monia Sabal  [facility]) - last appt July 2022.  Current home diabetes regimen: Omnipod with Dexcom (settings below).      Basal rates- 12am-12am 1.5 units/hr   TBD =36 units  Insulin to carbohydrate ratios:  1: 4.5 g carbs  Insulin sensitivity factor:  1: 25 mg/dl   Active insulin duration = 4 hours  BG targets: 130 mg/dL  Correct above 161 mg/dl   Current home blood glucose monitoring: dexcom.  Hypoglycemia awareness: yes.  Complications related to diabetes: none known    Current Nutrition:  Active Orders   Diet    Nutrition Therapy High Calorie High Protein         ROS: As per HPI.     albuterol  2.5 mg Nebulization 4x Daily (RT)    amitriptyline  100 mg Oral Nightly    atorvastatin  40 mg Oral Nightly    colistimetate (COLISTIN) inhalation  150 mg Inhalation Q12H SCH    dornase alfa  2.5 mg Inhalation Daily    elexacaftor-tezacaftor-ivacaft  2 tablet Oral Daily    And    elexacaftor-tezacaftor-ivacaft  1 tablet Oral Q PM    enoxaparin (LOVENOX) injection  40 mg Subcutaneous Q24H SCH    FLUoxetine  60 mg Oral Daily    fluticasone furoate-vilanteroL  1 puff Inhalation Daily (RT)    fluticasone propionate  2 spray Each Nare Daily    insulin glargine  50 Units Subcutaneous Nightly    insulin lispro  0-20 Units Subcutaneous ACHS    insulin lispro  40 Units Subcutaneous 3xd Meals    lamoTRIgine  400 mg Oral Daily    lipase-protease-amylase  8 capsule Oral 3xd Meals    lisinopriL  10 mg Oral Daily    loratadine  10 mg Oral Daily    montelukast  10 mg Oral Nightly    pantoprazole  20 mg Oral Daily    MVW Complete (pediatric multivit 61-D3-vit K)  2 capsule Oral Daily    piperacillin-tazobactam (ZOSYN) IV (intermittent)  4.5 g Intravenous Q6H    polyethylene glycol  17 g Oral Daily    pramipexole  0.25 mg Oral Nightly    sod chlor-bicarb-squeez bottle  1 packet Each Nare BID    sodium chloride 7%  4 mL Nebulization 4x Daily (RT)    traZODone  150 mg Oral Nightly       Current Outpatient Medications   Medication Instructions    acetaminophen (TYLENOL) 500 mg, Oral, Every 6 hours PRN    albuterol (PROVENTIL HFA;VENTOLIN HFA) 90 mcg/actuation inhaler 2 puffs, Inhalation, Every 6 hours PRN    albuterol 2.5 mg, Nebulization, 2 times a day    alcohol swabs (ALCOHOL PREP PADS) PadM Use as directed with inhaled antibiotics    amitriptyline (ELAVIL) 100 MG tablet TAKE ONE TABLET BY MOUTH AT BEDTIME atorvastatin (LIPITOR) 40 mg, Oral, Nightly    blood sugar diagnostic (GLUCOSE BLOOD) Strp Other, 3 times a day (with meals), Rx sent to [facility] 01/04/20     blood-glucose meter kit Dispense meter that is preferred by patient's insurance company    blood-glucose meter,continuous (DEXCOM G6 RECEIVER) Misc Use as directed    blood-glucose sensor (DEXCOM  G6 SENSOR) Devi Use sq as directed every 10 days    blood-glucose transmitter (DEXCOM G6 TRANSMITTER) Devi Use as directed, replace every 90 days    budesonide-formoteroL (SYMBICORT) 160-4.5 mcg/actuation inhaler 2 puffs, Inhalation, 2 times a day (standard)    colistimethate (COLYMYCIN) 150 mg injection Inhale 2 mL (150 mg total) Two (2) times a day, alternating 28 days on and 28 days off. Inject 2mL SWFI into colistin vial & gently mix, then withdraw 2mL (150mg ) for dose.    colistimethate (COLYMYCIN) 150 mg injection Inhale 2 mL (150 mg total) Two (2) times a day, alternating 28 days on and 28 days off. Inject 2mL SWFI into colistin vial & gently mix, then withdraw 2mL (150mg ) for dose.    EASY TOUCH LANCING DEVICE Misc Use as directed.    EASY TOUCH TWIST LANCETS 30 gauge Misc Use as directed.    elexacaftor-tezacaftor-ivacaft (TRIKAFTA) 100-50-75 mg(d) /150 mg (n) tablet Take 2 Tablets (orange) by mouth in the morning and 1 tablet (blue) in the evening with fatty food    empty container Misc Use as directed to dispose of syringes    FLUoxetine 60 mg, Oral, Daily (standard)    fluticasone propionate (FLONASE) 50 mcg/actuation nasal spray 2 sprays, Each Nare, Daily (standard)    FREESTYLE LIBRE 14 DAY SENSOR kit Use 3-4 times daily    gabapentin (NEURONTIN) 600 mg, Oral, 3 times a day (standard)    insulin ASPART (NOVOLOG U-100 INSULIN ASPART) 100 unit/mL injection Subcutaneously infuse up to 150 units daily via insulin pump    insulin pump cart,automated,BT (OMNIPOD 5 G6 PODS, GEN 5,) Crtg 1 each, subcutaneous (via wearable injector), Every 3 days, Change every 72 hour    lamoTRIgine (LAMICTAL) 400 mg, Oral, Daily (standard)    levocetirizine (XYZAL) 5 mg, Oral, Every evening    lipase-protease-amylase (CREON) 36,000-114,000- 180,000 unit CpDR Take 8 caps by mouth with meals three times daily and 4 caps with snacks up to three times a day.    lisinopriL (PRINIVIL,ZESTRIL) 10 mg, Oral, Daily (standard)    montelukast (SINGULAIR) 10 mg, Oral, Nightly    MVW COMPLETE FORMUL PROBIOTIC 40 billion cell -15 mg CpDR 1 capsule, Oral, Daily (standard)    MVW Complete, pediatric multivit 61-D3-vit K, (MVW COMPLETE FORMULATION) 1,500-800 unit-mcg cap 2 capsules, Oral, 2 times a day (standard)    nebulizers (LC PLUS) Misc use as directed with nebulized medications twice daily.    nebulizers (PARI LC D NEBULIZER) Misc Use with inhaled medication.    nebulizers Misc Use as directed with inhaled medications    omeprazole (PRILOSEC) 20 mg, Oral, Daily (standard)    pen needle, diabetic (BD ULTRA-FINE NANO PEN NEEDLE) 32 gauge x 5/32 (4 mm) Ndle use up to 4 times daily    pramipexole (MIRAPEX) 0.25 mg, Oral, Nightly    PULMOZYME 2.5 mg, Inhalation, Daily (standard)    sodium chloride 7% 7 % Nebu 4 mL, Nebulization, 2 times a day    sterile water Soln Use 2 mL to mix colistin, then add another 1 mL SWFI into neb cup along with mixed colistin.    syringe with needle (BD LUER-LOK SYRINGE) 3 mL 21 gauge x 1 Syrg For use with inhaled antibiotic    tamsulosin (FLOMAX) 0.4 mg, Oral, Daily PRN, For kidney stone    traZODone (DESYREL) 150 mg, Oral, Nightly    XARELTO 20 mg, Oral, Daily (standard)         Past medical, surgical, social, and family  hx reviewed in chart.     OBJECTIVE:  BP 120/89  - Pulse 107  - Temp 36.7 ??C (98.1 ??F) (Oral)  - Resp 18  - Ht 182.9 cm (6' 0.01)  - Wt (!) 107 kg (235 lb 14.3 oz)  - SpO2 97%  - BMI 31.99 kg/m??   Wt Readings from Last 12 Encounters:   02/07/22 (!) 107 kg (235 lb 14.3 oz)   01/25/22 (!) 107.5 kg (237 lb)   01/25/22 (!) 107.5 kg (237 lb)   11/03/21 (!) 108.2 kg (238 lb 8.6 oz)   09/09/21 (!) 110.8 kg (244 lb 4.3 oz)   07/30/21 (!) 111.1 kg (245 lb)   06/08/21 (!) 113.4 kg (250 lb)   03/14/21 (!) 111.2 kg (245 lb 1.6 oz)   03/09/21 (!) 109 kg (240 lb 3.2 oz)   02/06/21 (!) 103.4 kg (228 lb)   11/24/20 (!) 101.3 kg (223 lb 4.8 oz)   06/28/20 (!) 105.8 kg (233 lb 4.8 oz)       General: WD, WN. Very pleasant. Body mass index is 31.99 kg/m??.   HEENT: Normocephalic. EOMI.    Respiratory: No tachypnea.   Abdominal: Non-distended.    MSK: no edema.  Neuro: Awake, alert, and oriented

## 2022-02-15 NOTE — Unmapped (Signed)
Physician Discharge Summary [facility]  [facility]  [address]  [place] [address]  Dept: [phone]  Loc: [phone]     Identifying Information:   Christian Young  06/01/1986  [id number]    Primary Care Physician: Hal Morales, NP     Code Status: Full Code    Admit Date: 02/06/2022    Discharge Date: 02/15/2022     Discharge To: Home    Discharge Service: [facility] - Hospitalist [place] APP     Discharge Attending Physician: Jeanene Erb, FNP    Discharge Diagnoses:  Principal Problem:    Cystic fibrosis exacerbation (CMS-HCC) POA: Unknown  Active Problems:    Essential hypertension POA: Yes    Depressive disorder POA: Yes    Mood disorder (CMS-HCC) POA: Yes    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) POA: Yes    Chronic pansinusitis POA: Yes    Pancreatic insufficiency due to cystic fibrosis (CMS-HCC) POA: Yes    Bronchiectasis (CMS-HCC) POA: Yes    Chronic deep vein thrombosis (DVT) of lower extremity (CMS-HCC) POA: Yes  Resolved Problems:    * No resolved hospital problems. *      Outpatient Provider Follow Up Issues:   [ ]  Ambulatory referral sent to hematology at discharge for anticoagulation management  [ ]  Follow-up final surgical pathology from 6/30    Hospital Course:   Cystic fibrosis exacerbation: Patient presented to the ED with worsening fatigue, dyspnea and increased sputum production in the setting of recent rhinovirus for which he had been started on a course of PO Levaquin and 1 month of inhaled colistin. On admission,  RVP negative, CF sputum culture negative.  Chest x-ray with streaky right lower lung opacity, likely atelectasis.  Pulmonology consulted.  He was continued on IV Zosyn (6/27-7/6). He was then transitioned to p.o. Levaquin 750 mg daily for 4-day course at discharge, per pulmonology's recommendations. He was also continued on inhaled colistin x28 days (6/15-7/13) along with albuterol nebs, 7% hypertonic saline, chest vest 4x daily for airway clearance. His home Trikafta, Pulmozyme, Breo Ellipta were continued as well.  He had repeat PFTs which showed gradual improvement on 7/5.    CF related sinus disease: Saw Dr. Ralene Ok as an outpatient 6/15 who recommended consideration of inpatient surgery if/when patient was admitted. Repeat CT sinus this admission showed near complete opacification of the bilateral maxillary sinuses, although the ostiomeatal units are patent bilaterally.  Patient went to the OR for revision FESS on 6/30.  Patient completed nasal saline irrigation twice daily postoperatively, which he was advised to continue at discharge. While admitted he was continued on his home Flonase, Claritin, Singulair.  ENT arranging for outpatient follow-up in 2 weeks.    CF related diabetes, poorly-controlled: A1c 11.3 on admission, up from 10.6 in March.  Uses an insulin pump at home which he did not bring with him to the hospital.  His BG was poorly controlled while admitted and endocrine was consulted; ultimately he achieved reasonable BG control with 50 units of Lantus nightly, lispro 40 units 3 times daily AC, and lispro correctional.  At discharge he we will continue Lantus 15 units nightly.  We will also continue NovoLog sliding scale ACHS, in addition to nutritional NovoLog 3 times daily AC based on size of meal.  Endocrinology arranging for outpatient follow-up.    History of DVT: Hematology consulted given anticipated surgery in setting of unclear clotting history. They recommend transitioning to prophylactic anticoagulation with outpatient  follow up for long-term anticoagulation planning.    Anticoagulation was held for 5 days after sinus surgery, with resumption of prophylactic enoxaparin on 7/5.  Patient was monitored for 24 hours after initiation of anticoagulation without any signs of bleeding.  His home Xarelto was decreased to 10 mg daily at discharge.  He will follow-up with hematology as an outpatient.  Ambulatory referral was sent to hematology at discharge.    CF related pancreatic insufficiency: Continued home Creon.    Mood disorder: Continued home fluoxetine, amitriptyline, lamotrigine, trazodone.    Hypertension: Continued home lisinopril.    GERD:Continued formulary equivalent of home PPI.    Restless leg syndrome: Continued home Mirapex.    Chronic back pain: Patient reported he is no longer taking gabapentin.    Elevated liver enzymes (resolved): Has had mild elevations in the past which may be related to fatty liver disease.  LFTs were mildly elevated on admission and quickly normalized.  Suggest ongoing monitoring as an outpatient.      Procedures:  revision FESS 6/30   ______________________________________________________________________  Discharge Medications:     Your Medication List        STOP taking these medications      insulin ASPART 100 unit/mL vial  Commonly known as: NovoLOG U-100 Insulin aspart  Replaced by: insulin ASPART 100 unit/mL (3 mL) injection pen            START taking these medications      glucose 4 GM chewable tablet  Chew 4 tablets (16 g total) every ten (10) minutes as needed for low blood sugar ((For Blood Glucose LESS than 70 mg/dL and GREATER than or EQUAL to 54 mg/dL and able to take PO.)).     insulin ASPART 100 unit/mL (3 mL) injection pen  Commonly known as: NovoLOG FLEXPEN  Inject 0.35 mL (35 Units total) under the skin Three (3) times a day before meals. - smaller than usual sized meal = 35 units                - usual sized meal = 40 units                - larger than usual sized meal = 45 units  Replaces: insulin ASPART 100 unit/mL vial     insulin ASPART 100 unit/mL (3 mL) injection pen  Commonly known as: NovoLOG FLEXPEN  Inject 0.1 mL (10 Units total) under the skin Four (4) times a day (before meals and nightly). - If blood sugar 70-150, give 0 units.  - If blood sugar 151-200, give 2 units.  - If blood sugar 201-250, give 4 units. - If blood sugar 251-300, give 6 units. - If blood sugar 301-350, give 8 units. - If blood sugar >350, give 10 units and notify your endocrinologist.     insulin glargine 100 unit/mL (3 mL) injection pen  Commonly known as: BASAGLAR, LANTUS  Inject 0.5 mL (50 Units total) under the skin nightly.     oxyCODONE 5 MG immediate release tablet  Commonly known as: ROXICODONE  Take 1 tablet (5 mg total) by mouth every four (4) hours as needed for up to 5 days.     sod chlor-bicarb-squeez bottle nasal packet  Commonly known as: NEILMED  1 packet into each nostril two (2) times a day.            CHANGE how you take these medications      colistimethate 150 mg injection  Commonly  known as: COLYMYCIN  Inhale 2 mL (150 mg total) Two (2) times a day for 28 days.  What changed: Another medication with the same name was removed. Continue taking this medication, and follow the directions you see here.     MVW COMPLETE FORMUL PROBIOTIC 40 billion cell -15 mg Cpdr  Generic drug: Lacto-Bif-Sac-Bacil-Strep-bact  Take 1 capsule by mouth daily.  What changed: when to take this     pen needle, diabetic 32 gauge x 5/32 (4 mm) Ndle  Commonly known as: BD ULTRA-FINE NANO PEN NEEDLE  use up to 4 times daily  What changed: Another medication with the same name was added. Make sure you understand how and when to take each.     pen needle, diabetic 32 gauge x 5/32 (4 mm) Ndle  Use with insulin up to 4 times/day as needed.  What changed: You were already taking a medication with the same name, and this prescription was added. Make sure you understand how and when to take each.     rivaroxaban 10 mg tablet  Commonly known as: XARELTO  Take 1 tablet (10 mg total) by mouth daily.  What changed:   medication strength  how much to take            CONTINUE taking these medications      acetaminophen 500 MG tablet  Commonly known as: TYLENOL  Take 1 tablet (500 mg total) by mouth every six (6) hours as needed.     albuterol 90 mcg/actuation inhaler  Commonly known as: PROVENTIL HFA;VENTOLIN HFA  Inhale 2 puffs every six (6) hours as needed.     albuterol 2.5 mg /3 mL (0.083 %) nebulizer solution  Inhale 3 mL (2.5 mg total) by nebulization two (2) times a day.     ALCOHOL PREP PADS Padm  Generic drug: alcohol swabs  Use as directed with inhaled antibiotics     amitriptyline 100 MG tablet  Commonly known as: ELAVIL  TAKE ONE TABLET BY MOUTH AT BEDTIME     atorvastatin 40 MG tablet  Commonly known as: LIPITOR  Take 1 tablet (40 mg total) by mouth nightly.     BD LUER-LOK SYRINGE 3 mL 21 gauge x 1 Syrg  Generic drug: syringe with needle  For use with inhaled antibiotic     blood-glucose meter kit  Dispense meter that is preferred by patient's insurance company     budesonide-formoteroL 160-4.5 mcg/actuation inhaler  Commonly known as: SYMBICORT  Inhale 2 puffs Two (2) times a day.     DEXCOM G6 RECEIVER Misc  Generic drug: blood-glucose meter,continuous  Use as directed     DEXCOM G6 SENSOR Devi  Generic drug: blood-glucose sensor  Use sq as directed every 10 days     DEXCOM G6 TRANSMITTER Devi  Generic drug: blood-glucose transmitter  Use as directed, replace every 90 days     EASY TOUCH LANCING DEVICE Misc  Generic drug: lancing device  Use as directed.     EASY TOUCH TWIST LANCETS 30 gauge Misc  Generic drug: lancets  Use as directed.     empty container Misc  Use as directed to dispose of syringes     FLUoxetine 60 mg Tab  Take 60 mg by mouth daily.     fluticasone propionate 50 mcg/actuation nasal spray  Commonly known as: FLONASE  2 sprays into each nostril daily.     FREESTYLE LIBRE 14 DAY SENSOR  Use 3-4 times daily  gabapentin 600 MG tablet  Commonly known as: NEURONTIN  Take 1 tablet (600 mg total) by mouth Three (3) times a day.     glucose blood Strp  Generic drug: blood sugar diagnostic  by Other route Three (3) times a day with a meal. Rx sent to [facility] 01/04/20     lamoTRIgine 200 MG tablet  Commonly known as: LaMICtal  Take 2 tablets (400 mg total) by mouth daily.     LC PLUS Misc  Generic drug: nebulizers  Use as directed with inhaled medications     LC PLUS Misc  Generic drug: nebulizers  use as directed with nebulized medications twice daily.     LC PLUS Misc  Generic drug: nebulizers  Use with inhaled medication.     levocetirizine 5 MG tablet  Commonly known as: XYZAL  Take 1 tablet (5 mg total) by mouth every evening.     levoFLOXacin 750 MG tablet  Commonly known as: LEVAQUIN  Take 1 tablet (750 mg total) by mouth daily for 4 days.  Start taking on: February 16, 2022     lipase-protease-amylase 36,000-114,000- 180,000 unit Cpdr  Commonly known as: CREON  Take 8 caps by mouth with meals three times daily and 4 caps with snacks up to three times a day.     lisinopriL 10 MG tablet  Commonly known as: PRINIVIL,ZESTRIL  Take 1 tablet (10 mg total) by mouth daily.     montelukast 10 mg tablet  Commonly known as: SINGULAIR  Take 1 tablet (10 mg total) by mouth nightly.     MVW Complete (pediatric multivit 61-D3-vit K) 1,500-800 unit-mcg Cap  Commonly known as: MVW COMPLETE FORMULATION  Take 2 capsules by mouth Two (2) times a day.     omeprazole 20 MG capsule  Commonly known as: PriLOSEC  Take 1 capsule (20 mg total) by mouth daily.     OMNIPOD 5 G6 PODS (GEN 5) Crtg  Generic drug: insulin pump cart,automated,BT  1 each by subcutaneous (via wearable injector) route every three (3) days. Change every 72 hour     pramipexole 0.125 MG tablet  Commonly known as: MIRAPEX  Take 2 tablets (0.25 mg total) by mouth nightly.     PULMOZYME 1 mg/mL nebulizer solution  Generic drug: dornase alfa  Inhale 2.5 mg daily.     sodium chloride 7% 7 % Nebu  Inhale 4 mL by nebulization two (2) times a day.     sterile water Soln  Use 2 mL to mix colistin, then add another 1 mL SWFI into neb cup along with mixed colistin.     tamsulosin 0.4 mg capsule  Commonly known as: FLOMAX  Take 1 capsule (0.4 mg total) by mouth daily as needed. For kidney stone     traZODone 150 MG tablet  Commonly known as: DESYREL  Take 1 tablet (150 mg total) by mouth nightly.     TRIKAFTA 100-50-75 mg(d) /150 mg (n) tablet  Generic drug: elexacaftor-tezacaftor-ivacaft  Take 2 Tablets (orange) by mouth in the morning and 1 tablet (blue) in the evening with fatty food              Allergies:  Aztreonam, [place] [aztreonam lysine], Cefepime, Other, Slo-bid 100, Tobramycin, and Banana  ______________________________________________________________________  Pending Test Results (if blank, then none):  Pending Labs       Order Current Status    Surgical pathology exam In process  Most Recent Labs:   Latest Reference Range & Units 02/12/22 04:04   WBC 3.6 - 11.2 10*9/L 7.1   RBC 4.26 - 5.60 10*12/L 4.25 (L)   HGB 12.9 - 16.5 g/dL 16.1 (L)   HCT 09.6 - 04.5 % 34.6 (L)   MCV 77.6 - 95.7 fL 81.4   MCH 25.9 - 32.4 pg 27.3   MCHC 32.0 - 36.0 g/dL 40.9   RDW 81.1 - 91.4 % 16.0 (H)   MPV 6.8 - 10.7 fL 8.1   Platelet 150 - 450 10*9/L 252   Neutrophils % % 61.2   Lymphocytes % % 23.9   Monocytes % % 9.8   Eosinophils % % 4.8   Basophils % % 0.3   Absolute Neutrophils 1.8 - 7.8 10*9/L 4.3   Absolute Lymphocytes 1.1 - 3.6 10*9/L 1.7   Absolute Monocytes  0.3 - 0.8 10*9/L 0.7   Absolute Eosinophils 0.0 - 0.5 10*9/L 0.3   Absolute Basophils  0.0 - 0.1 10*9/L 0.0   Sed Rate 0 - 15 mm/h 31 (H)   Sodium 135 - 145 mmol/L 140   Potassium 3.4 - 4.8 mmol/L 3.8   Chloride 98 - 107 mmol/L 105   CO2 20.0 - 31.0 mmol/L 28.0   Bun 9 - 23 mg/dL 7 (L)   Creatinine 7.82 - 1.10 mg/dL 9.56   BUN/Creatinine Ratio  11   eGFR CKD-EPI (2021) Male >=60 mL/min/1.65m2 >90   Anion Gap 5 - 14 mmol/L 7   Glucose 70 - 179 mg/dL 213 (H)   Calcium 8.7 - 10.4 mg/dL 8.6 (L)   Magnesium 1.6 - 2.6 mg/dL 1.8   Albumin 3.4 - 5.0 g/dL 3.6   Total Protein 5.7 - 8.2 g/dL 6.4   Total Bilirubin 0.3 - 1.2 mg/dL 0.3   SGOT (AST) <=08 U/L 27   ALT 10 - 49 U/L 53 (H)   Alkaline Phosphatase 46 - 116 U/L 80   CRP <=10.0 mg/L 7.0   (L): Data is abnormally low  (H): Data is abnormally high    Relevant Studies/Radiology (if blank, then none):  ECG 12 Lead    Result Date: 02/07/2022  NORMAL SINUS RHYTHM NORMAL ECG WHEN COMPARED WITH ECG OF 03-Nov-2021 11:47, NO SIGNIFICANT CHANGE WAS FOUND Confirmed by Mariane Baumgarten ([id number]) on 02/07/2022 7:57:13 AM    XR Chest 2 views    Result Date: 02/06/2022  EXAM: XR CHEST 2 VIEWS DATE: 02/06/2022 11:32 AM ACCESSION: [id number] UN DICTATED: 02/06/2022 12:00 PM INTERPRETATION LOCATION: [facility] CLINICAL INDICATION: 36 years old Male with COUGH  TECHNIQUE: PA and Lateral Chest Radiographs. COMPARISON: Chest radiograph on 01/25/2022 FINDINGS: Left sided Port-A-Cath with tip terminating in the right atrium. Surgical clips over the right hilum. The lungs are mildly hypoinflated. Right apical lung scarring. Streaky right lower lung opacity. No pleural effusion or pneumothorax. Similar appearance of the cardiomediastinal silhouette.     Streaky right lower lung opacity, likely atelectasis.    CT Sinus W Contrast    Result Date: 02/07/2022  EXAM: Computed tomography, sinus, with contrast material DATE: 02/06/2022 5:32 PM ACCESSION: [id number] UN DICTATED: 02/06/2022 10:19 PM INTERPRETATION LOCATION: [facility] CLINICAL INDICATION: 36 years old Male with sinusitis, Cystic fibrosis   CT sinus maxillofacial for planning for possible sinus surgery with CF  COMPARISON: 06/08/2021 CT head TECHNIQUE: Axial CT images through the paranasal sinuses with contrast. Coronal and sagittal reformatted images are provided. FINDINGS:  Sequela of prior sinus surgery bilateral maxillary antrostomies. Frontal sinuses are clear. There  is reactive osteitis of the frontal bone surrounding the frontal sinuses. Frontal recesses are patent. Partial opacification of the bilateral ethmoid air cells. Sphenoid sinuses are clear. Sphenoethmoidal recesses are patent. Near-complete opacification of the bilateral maxillary sinuses. Ostiomeatal units are patent. Leftward deviation of the posterior nasal septum with bony spur. No skull base dehiscence. There is no abnormal enhancement.     Sequela of prior sinus surgery with bilateral maxillary antrostomies. There is near complete opacification of the bilateral maxillary sinuses, although the ostiomeatal units are patent bilaterally.   ______________________________________________________________________  Discharge Instructions:     Diet Instructions       Discharge diet (specify)      Discharge Nutrition Therapy: Regular            Follow Up instructions and Outpatient Referrals     Ambulatory referral to Hematology      Discharge instructions          Other Instructions       Discharge instructions      A prescription has been sent for Xarelto 10 mg daily to your local pharmacy.  Please stop taking the Xarelto 20 mg daily that you were taking prior to coming to the hospital.  A referral has been sent to hematology for outpatient follow-up to determine how long you need to be on the Xarelto for.  They should be calling you within the next few days to schedule that initial appointment.  You have been started on an oral antibiotic called Levaquin for 4 days.  Please start taking it tomorrow, 02/16/2022.  You will be transitioning to insulin pens at discharge.  Endocrinology has recommended the following insulin regimen for you to start at home:   -- Lantus 50 units nightly  -- Nutritional Novolog 3 times a day with meals:                 - smaller than usual sized meal = 35 units                 - usual sized meal = 40 units                 - larger than usual sized meal = 45 units  -- Correctional Novolog sliding scale with meals and at bedtime:        - If blood sugar 70-150, give 0 units.                 - If blood sugar 151-200, give 2 units.                 - If blood sugar 201-250, give 4 units.      - If blood sugar 251-300, give 6 units.      - If blood sugar 301-350, give 8 units.      - If blood sugar >350, give 10 units and notify your endocrinologist.  Endocrinology will be arranging for outpatient follow-up.  Please have glucose tablets with you in case you are blood sugar drops.  You can get these over-the-counter.  Please continue the nasal saline irrigations twice daily. ENT will be arranging for outpatient follow up in 2 weeks.  Per the pulmonologist, please continue taking your inhaled colistin for a planned 28-day course (01/25/2022 -02/22/2022).            Appointments which have been scheduled for you      Mar 01, 2022  2:30 PM  (Arrive by 2:15 PM)  RETURN ENT with Adam [place]aziland Kimple, MD  [facility] [place]OLARYNGOLOGY [place] [facility] REGION) [address]  [address]  [place] [address]  [phone]        Mar 19, 2022 11:00 AM  (Arrive by 10:45 AM)  DIABETIC EDUCATION with Clovia Cuff  [facility] DIABETES AND ENDOCRINOLOGY [place] [facility] [place]) [address]  [place] [address]  [phone]        Mar 22, 2022  9:20 AM  (Arrive by 9:05 AM)  CT MAXILLOFACIAL WO CONTRAST with IC CT RM 1  RAD [facility] [address] [facility]) [address]  [place] [address]  [phone]   Let us know if pt:  Pregnant or nursing  Claustrophobic    (Title:[id number])         Mar 22, 2022 10:45 AM  (Arrive by 10:30 AM)  RETURN ENT with Adam [place]az[place] Kimple, MD  [facility]ARYNGOLOGY [place] [facility] REGION) [address]  [address]  [place] [address]  [phone]        Mar 27, 2022 12:40 PM  (Arrive by 12:25 PM)  PFT with PFT 4  [facility] PULMONARY SPECIALTY FUNCT [place] [facility] REGION) [address]  [place] [address]  [phone]        Mar 27, 2022  1:00 PM  (Arrive by 12:45 PM)  RETURN CYSTIC FIBROSIS with Satira Sark, MD  [facility] PULMONARY SPECIALTY CL [place] [facility] REGION) [address]  [place] [address]  [phone] Apr 03, 2022 10:30 AM  (Arrive by 10:15 AM)  RETURN HEM BENIGN with Archie Endo, MD  [facility] BENIGN HEMATOLOGY CLINIC [place] [facility] REGION) [address]  [place] [address]  [phone]        Jun 25, 2022 10:10 AM  (Arrive by 9:55 AM)  RETURN  DIABETES with Jimmie Molly, MD  [facility] DIABETES AND ENDOCRINOLOGY [place] [facility] REGION) [address]  [place] [address]  [phone]             ______________________________________________________________________  Discharge Day Services:  BP 109/81  - Pulse 119  - Temp 36.8 ??C (98.2 ??F)  - Resp 18  - Ht 182.9 cm (6' 0.01)  - Wt (!) 107 kg (235 lb 14.3 oz)  - SpO2 95%  - BMI 31.99 kg/m??   Pt seen on the day of discharge and determined appropriate for discharge.    Condition at Discharge: good    Length of Discharge: I spent greater than 30 mins in the discharge of this patient.

## 2022-02-16 MED ORDER — LEVOFLOXACIN 750 MG TABLET
ORAL_TABLET | Freq: Every day | ORAL | 0 refills | 4 days | Status: CP
Start: 2022-02-16 — End: 2022-02-20

## 2022-02-16 NOTE — Unmapped (Signed)
Patient was Alert and oriented. Patient port was de-assessed with port being intact.    Problem: Adult Inpatient Plan of Care  Goal: Plan of Care Review  Outcome: Resolved  Goal: Patient-Specific Goal (Individualized)  Outcome: Resolved  Goal: Absence of Hospital-Acquired Illness or Injury  Outcome: Resolved  Intervention: Identify and Manage Fall Risk  Recent Flowsheet Documentation  Taken 02/15/2022 0844 by Joneen Boers, RN  Safety Interventions:   nonskid shoes/slippers when out of bed   lighting adjusted for tasks/safety   low bed  Intervention: Prevent Skin Injury  Recent Flowsheet Documentation  Taken 02/15/2022 1652 by Joneen Boers, RN  Skin Protection: adhesive use limited  Taken 02/15/2022 1445 by Joneen Boers, RN  Skin Protection: adhesive use limited  Taken 02/15/2022 1209 by Joneen Boers, RN  Skin Protection: adhesive use limited  Taken 02/15/2022 1051 by Joneen Boers, RN  Skin Protection: adhesive use limited  Intervention: Prevent Infection  Recent Flowsheet Documentation  Taken 02/15/2022 0844 by Joneen Boers, RN  Infection Prevention:   hand hygiene promoted   cohorting utilized  Goal: Optimal Comfort and Wellbeing  Outcome: Resolved  Goal: Readiness for Transition of Care  Outcome: Resolved  Goal: Rounds/Family Conference  Outcome: Resolved     Problem: Infection  Goal: Absence of Infection Signs and Symptoms  Outcome: Resolved  Intervention: Prevent or Manage Infection  Recent Flowsheet Documentation  Taken 02/15/2022 0844 by Joneen Boers, RN  Infection Management: aseptic technique maintained  Isolation Precautions: droplet precautions maintained     Problem: Impaired Wound Healing  Goal: Optimal Wound Healing  Outcome: Resolved     Problem: Fall Injury Risk  Goal: Absence of Fall and Fall-Related Injury  Outcome: Resolved  Intervention: Promote Injury-Free Environment  Recent Flowsheet Documentation  Taken 02/15/2022 0844 by Joneen Boers, RN  Safety Interventions:   nonskid shoes/slippers when out of bed lighting adjusted for tasks/safety   low bed

## 2022-02-21 DIAGNOSIS — A498 Other bacterial infections of unspecified site: Principal | ICD-10-CM

## 2022-02-21 MED ORDER — COLISTIN (COLISTIMETHATE SODIUM) 150 MG SOLUTION FOR INJECTION
Freq: Two times a day (BID) | RESPIRATORY_TRACT | 6 refills | 30 days | Status: CP
Start: 2022-02-21 — End: 2022-03-23

## 2022-02-21 NOTE — Unmapped (Signed)
Christian Young Refill Coordination Note    Christian Young, [place]: 04/06/1986  Phone: [phone] (home)       All above HIPAA information was verified with patient.         02/19/2022     2:05 PM   Specialty Rx Medication Refill Questionnaire   Which Medications would you like refilled and shipped? colistimethate , A week   Please list all current allergies: Na   Have you missed any doses in the last 30 days? No   Have you had any changes to your medication(s) since your last refill? No   How many days remaining of each medication do you have at home? 14   Have you experienced any side effects in the last 30 days? No   Please enter the full address (street address, city, state, zip code) where you would like your medication(s) to be delivered to. [phone] , [place], [place] [address]   Please specify on which day you would like your medication(s) to arrive. Note: if you need your medication(s) within 3 days, please call the Young to schedule your order at [phone]  02/22/2022   Has your insurance changed since your last refill? Yes   If YES, please enter your new insurance information below. [employer] and [employer] of [place]   Would you like a pharmacist to call you to discuss your medication(s)? No   Do you require a signature for your package? (Note: if we are billing Medicare Part B or your order contains a controlled substance, we will require a signature) No         Completed refill call assessment today to schedule patient's medication shipment from the Christian Young [phone]).  All relevant notes have been reviewed.       Confirmed patient received a Christian Young and a Christian Young with first shipment. The patient will receive a drug information handout for each medication shipped and additional FDA Medication Guides as required.         REFERRAL TO PHARMACIST     Referral to the pharmacist: Not needed      Christian Young     Shipping address confirmed in Epic.     Delivery Scheduled: Yes, Expected medication delivery date: 07/13.  However, Rx request for refills was sent to the provider as there are none remaining.     Medication will be delivered via UPS to the prescription address in Epic WAM.    Lorenzo Young' Christian Young Christian Young

## 2022-02-21 NOTE — Unmapped (Signed)
Patient's chart shows colistimethate prescription was sent to [facility] in [place], [place]. I called patient to verify if he wanted to get medication from them and he said no. I requested a new colistimethate prescription from provider and advised the patient to also call to request a new prescription for [name] as well. He stated he needs alcohol prep pads, BD Luer-Lok syringes, and sterile water to be filled.He has enough Trikafta on hand until next month refill call.

## 2022-02-22 DIAGNOSIS — A498 Other bacterial infections of unspecified site: Principal | ICD-10-CM

## 2022-02-22 MED ORDER — TRIKAFTA 100-50-75 MG (D)/150 MG (N) TABLETS
ORAL_TABLET | 5 refills | 0 days | Status: CP
Start: 2022-02-22 — End: ?

## 2022-02-22 NOTE — Unmapped (Signed)
The [facility] has received the prescription(s) for Trikafta. The triage team has completed the benefits investigation and has determined that the patient is NOT able to fill this medication at the [facility] due to insurance plan limitations. Please see additional information below and re-route the prescription to the preferred pharmacy. Thank you.    PA Required: Yes    Specialty Pharmacy Required:  [facility] - Phone:[phone] and Fax: [phone]

## 2022-02-22 NOTE — Unmapped (Signed)
Christian Young 's colistimethate shipment will be delayed as a result of prior authorization being required by the patient's insurance.     I have reached out to the patient  at [phone] and left a voicemail message.  We will call the patient back to reschedule the delivery upon resolution. We have not confirmed the new delivery date.

## 2022-02-26 NOTE — Unmapped (Signed)
[facility]    Home Spirometry Monitoring      02/26/22        I have reviewed 1 home spirometry test(s) over prior calendar month.  Most recent test demonstrates moderate obstruction and moderate restriction.  Compared to prior home spirometry values, the most recent test is unchanged. Best in-clinic FEV1 in last year was 3.49 L (75%) on 01/25/22.         Quality of most recent test graded E.     Follow-up plan:   ??? Home spirometry shown above was performed prior to an admission to the hospital for a pulmonary exacerbation. His PFTs improved prior to discharge.   ??? Continue monthly home spirometry monitoring for the management of cystic fibrosis.    Karl Ito, [place]  [facility]   [phone]

## 2022-02-26 NOTE — Unmapped (Signed)
Aldis left a message with this CF nurse coordinator. He reports that [employer] of [place] is not covering his colistin nebs (see notes by pharmacy).      Will route to the team to determine alternative routes to fill (home infusion?) or if Dr [name] would like prescribe an alternative medication (if available).     [name], RN  CF Nurse Coordinator   [phone]

## 2022-02-26 NOTE — Unmapped (Signed)
Christian Young 's colistimethate shipment will be canceled  as a result of medication excluded from pharmacy benefit. Should be covered under medical.    I have reached out to the patient  at [phone] and communicated the delivery change. We will not reschedule the medication and have removed this/these medication(s) from the work request.  We have canceled this work request.

## 2022-02-27 DIAGNOSIS — K8689 Other specified diseases of pancreas: Principal | ICD-10-CM

## 2022-02-27 MED ORDER — LIPASE-PROTEASE-AMYLASE 36,000-114,000-180,000 UNIT CAPSULE,DELAY REL
ORAL_CAPSULE | 11 refills | 0 days | Status: CP
Start: 2022-02-27 — End: ?

## 2022-02-28 DIAGNOSIS — K8689 Other specified diseases of pancreas: Principal | ICD-10-CM

## 2022-03-01 DIAGNOSIS — A498 Other bacterial infections of unspecified site: Principal | ICD-10-CM

## 2022-03-01 NOTE — Unmapped (Signed)
Faxed order for colistin vials to be inhaled BID x 28 days to [facility]. Reached out to [facility], spoke with Tresa Endo (?pharmacist) who stated that they do not dispense inhaled medications from their pharmacy.  Will update team.     [name]. [name], RN  CF Nurse Coordinator   [phone]

## 2022-03-02 NOTE — Unmapped (Signed)
[facility]  [facility]      [place] as planned during his last clinic visit to follow-up on his efforts to find a therapist. He did not answer and this writer left a VM message. Will await a response and try again at a later date.

## 2022-03-07 DIAGNOSIS — A498 Other bacterial infections of unspecified site: Principal | ICD-10-CM

## 2022-03-08 NOTE — Unmapped (Signed)
[facility] Pharmacist Note       March 08, 2022 3:59 PM: Phone call to [facility] Customer Service   Colistin will not be covered under pharmacy benefits as this medication is excluded from his plan. [employer] said it had to go under medical benefits. They requested diagnosis codes and procedure codes. I provided the diagnosis codes on the prescription but I do not know where to find the procedure codes. [employer] told me these would come from the coding and billing department.    Colistin is covered under [employer]. The cash price is ~$1800 for 1 month. He has the full balance of $15,000 that runs out in Sept 2023. Would consider billing through [employer]. Routed back to team.       Total time spent: 45 minutes     Electronically signed:  Alben Spittle, PharmD, BCACP, CPP  Clinical Pharmacist Practitioner  [facility]  [phone]

## 2022-03-14 DIAGNOSIS — A498 Other bacterial infections of unspecified site: Principal | ICD-10-CM

## 2022-03-14 MED ORDER — COLISTIN (COLISTIMETHATE SODIUM) 150 MG SOLUTION FOR INJECTION
Freq: Two times a day (BID) | RESPIRATORY_TRACT | 6 refills | 28 days | Status: CP
Start: 2022-03-14 — End: 2023-03-14

## 2022-03-19 ENCOUNTER — Ambulatory Visit: Admit: 2022-03-19 | Payer: PRIVATE HEALTH INSURANCE

## 2022-03-19 NOTE — Unmapped (Unsigned)
Time in/out:  Total time:    Assessment/Plan:   Uncontrolled Diabetes, CF related diabetes  Recent hospitalization 02/15/2022 for CF exacerbation following sinus surgery.    PCP: Hal Morales, NP    CC: Follow up Type 1 Diabetes        Subjective:      Christian Young is a 36 y.o. male who presents for f/u of Type 1 diabetes mellitus.  Last seen by Dr. Dierdre Searles during hospitalization 02/15/2022 and by Christian Young in clinic 05/12/2021 for omnipod 5 start.    Diagnosed with diabetes ***    Meter downloaded and reviewed. ***           Current Diabetes Medications ***  MDI  Lantus 50 units at bedtime  Lispro ~40 units plus correction 2 units/50>150    Diet: ***  24 hour recall  ETOH    Exercise: ***    DM Related Complications:  ***    Eye exam last performed: ***    Social History: ***  Barriers to diabetes management ***  Stress ***  Support System ***  Motivation to make changes ***            Objective:        There were no vitals taken for this visit.        Past Medical History:   Diagnosis Date   ??? Anxiety    ??? Asthma    ??? Chronic pain disorder    ??? Cystic fibrosis (CMS-HCC)    ??? Depression    ??? Diabetes mellitus (CMS-HCC)    ??? Hypertension    ??? Lumbar radiculopathy 10/26/2020   ??? Nonproductive cough 04/05/2018         Current Outpatient Medications:   ???  acetaminophen (TYLENOL) 500 MG tablet, Take 1 tablet (500 mg total) by mouth every six (6) hours as needed., Disp: 30 tablet, Rfl: 0  ???  albuterol (PROVENTIL HFA;VENTOLIN HFA) 90 mcg/actuation inhaler, Inhale 2 puffs every six (6) hours as needed., Disp: 1 Inhaler, Rfl: 1  ???  albuterol 2.5 mg /3 mL (0.083 %) nebulizer solution, Inhale 3 mL (2.5 mg total) by nebulization two (2) times a day., Disp: 540 mL, Rfl: 3  ???  alcohol swabs (ALCOHOL PREP PADS) PadM, Use as directed with inhaled antibiotics, Disp: 100 each, Rfl: 11  ???  amitriptyline (ELAVIL) 100 MG tablet, TAKE ONE TABLET BY MOUTH AT BEDTIME (Patient taking differently: Take 1 tablet (100 mg total) by mouth nightly.), Disp: 30 tablet, Rfl: 1  ???  atorvastatin (LIPITOR) 40 MG tablet, Take 1 tablet (40 mg total) by mouth nightly., Disp: , Rfl:   ???  blood sugar diagnostic (GLUCOSE BLOOD) Strp, by Other route Three (3) times a day with a meal. Rx sent to [facility] 01/04/20, Disp: , Rfl:   ???  blood-glucose meter kit, Dispense meter that is preferred by [employer], Disp: 1 each, Rfl: 0  ???  blood-glucose meter,continuous (DEXCOM G6 RECEIVER) Misc, Use as directed, Disp: 1 each, Rfl: 0  ???  blood-glucose sensor (DEXCOM G6 SENSOR) Devi, Use sq as directed every 10 days, Disp: 9 each, Rfl: 11  ???  blood-glucose transmitter (DEXCOM G6 TRANSMITTER) Devi, Use as directed, replace every 90 days, Disp: 1 each, Rfl: 11  ???  budesonide-formoteroL (SYMBICORT) 160-4.5 mcg/actuation inhaler, Inhale 2 puffs Two (2) times a day., Disp: 30.6 g, Rfl: 3  ???  colistimethate (COLYMYCIN) 150 mg injection, Inhale the contents of 1 vial (150  mg total) Two (2) times a day. Dispense 56 vials for 28 day supply., Disp: 112 mL, Rfl: 6  ???  EASY TOUCH LANCING DEVICE Misc, Use as directed., Disp: , Rfl:   ???  EASY TOUCH TWIST LANCETS 30 gauge Misc, Use as directed., Disp: , Rfl:   ???  elexacaftor-tezacaftor-ivacaft (TRIKAFTA) 100-50-75 mg(d) /150 mg (n) tablet, Take 2 Tablets (orange) by mouth in the morning and 1 tablet (blue) in the evening with fatty food, Disp: 84 tablet, Rfl: 5  ???  empty container Misc, Use as directed to dispose of syringes, Disp: 1 each, Rfl: 0  ???  FLUoxetine 60 mg Tab, Take 60 mg by mouth daily. , Disp: , Rfl:   ???  fluticasone propionate (FLONASE) 50 mcg/actuation nasal spray, 2 sprays into each nostril daily. (Patient taking differently: 1 spray into each nostril daily.), Disp: , Rfl:   ???  FREESTYLE LIBRE 14 DAY SENSOR kit, Use 3-4 times daily, Disp: , Rfl:   ???  gabapentin (NEURONTIN) 600 MG tablet, Take 1 tablet (600 mg total) by mouth Three (3) times a day., Disp: , Rfl:   ???  glucose 4 GM chewable tablet, Chew 4 tablets (16 g total) every ten (10) minutes as needed for low blood sugar ((For Blood Glucose LESS than 70 mg/dL and GREATER than or EQUAL to 54 mg/dL and able to take PO.))., Disp: 50 tablet, Rfl: 12  ???  insulin ASPART (NOVOLOG FLEXPEN) 100 unit/mL (3 mL) injection pen, Inject 0.35 mL (35 Units total) under the skin Three (3) times a day before meals. - smaller than usual sized meal = 35 units                - usual sized meal = 40 units                - larger than usual sized meal = 45 units, Disp: 40.5 mL, Rfl: 4  ???  insulin ASPART (NOVOLOG FLEXPEN) 100 unit/mL (3 mL) injection pen, Inject 0.1 mL (10 Units total) under the skin Four (4) times a day (before meals and nightly). - If blood sugar 70-150, give 0 units.  - If blood sugar 151-200, give 2 units.  - If blood sugar 201-250, give 4 units. - If blood sugar 251-300, give 6 units. - If blood sugar 301-350, give 8 units. - If blood sugar >350, give 10 units and notify your endocrinologist., Disp: 12 mL, Rfl: 4  ???  insulin glargine (BASAGLAR, LANTUS) 100 unit/mL (3 mL) injection pen, Inject 0.5 mL (50 Units total) under the skin nightly., Disp: 15 mL, Rfl: 3  ???  insulin pump cart,automated,BT (OMNIPOD 5 G6 PODS, GEN 5,) Crtg, 1 each by subcutaneous (via wearable injector) route every three (3) days. Change every 72 hour, Disp: 10 each, Rfl: 12  ???  lamoTRIgine (LAMICTAL) 200 MG tablet, Take 2 tablets (400 mg total) by mouth daily., Disp: , Rfl:   ???  levocetirizine (XYZAL) 5 MG tablet, Take 1 tablet (5 mg total) by mouth every evening., Disp: 90 tablet, Rfl: 3  ???  lipase-protease-amylase (CREON) 36,000-114,000- 180,000 unit CpDR, Take 8 caps by mouth with meals three times daily and 4 caps with snacks up to three times a day., Disp: 1000 capsule, Rfl: 11  ???  lisinopriL (PRINIVIL,ZESTRIL) 10 MG tablet, Take 1 tablet (10 mg total) by mouth daily., Disp: 30 tablet, Rfl: 0  ???  montelukast (SINGULAIR) 10 mg tablet, Take 1 tablet (10 mg total) by mouth  nightly., Disp: 90 tablet, Rfl: 3  ???  MVW COMPLETE FORMUL PROBIOTIC 40 billion cell -15 mg CpDR, Take 1 capsule by mouth daily. (Patient taking differently: Take 1 capsule by mouth in the morning.), Disp: 90 capsule, Rfl: 3  ???  MVW Complete, pediatric multivit 61-D3-vit K, (MVW COMPLETE FORMULATION) 1,500-800 unit-mcg Young, Take 2 capsules by mouth Two (2) times a day. (Patient taking differently: Take 2 capsules by mouth daily.), Disp: , Rfl:   ???  nebulizers (LC PLUS) Misc, use as directed with nebulized medications twice daily., Disp: 4 each, Rfl: 4  ???  nebulizers (PARI LC D NEBULIZER) Misc, Use with inhaled medication., Disp: 1 each, Rfl: 6  ???  nebulizers Misc, Use as directed with inhaled medications, Disp: 4 each, Rfl: 3  ???  omeprazole (PRILOSEC) 20 MG capsule, Take 1 capsule (20 mg total) by mouth daily., Disp: 90 capsule, Rfl: 3  ???  pen needle, diabetic (BD ULTRA-FINE NANO PEN NEEDLE) 32 gauge x 5/32 (4 mm) Ndle, use up to 4 times daily, Disp: 400 each, Rfl: 12  ???  pen needle, diabetic 32 gauge x 5/32 (4 mm) Ndle, Use with insulin up to 4 times/day as needed., Disp: 1 each, Rfl: 5  ???  pramipexole (MIRAPEX) 0.125 MG tablet, Take 2 tablets (0.25 mg total) by mouth nightly., Disp: , Rfl:   ???  PULMOZYME 1 mg/mL nebulizer solution, Inhale 2.5 mg daily., Disp: 225 mL, Rfl: 3  ???  rivaroxaban (XARELTO) 10 mg tablet, Take 1 tablet (10 mg total) by mouth daily., Disp: 90 tablet, Rfl: 2  ???  sodium chloride 7% 7 % Nebu, Inhale 4 mL by nebulization two (2) times a day., Disp: , Rfl:   ???  sterile water Soln, Use 2 mL to mix colistin, then add another 1 mL SWFI into neb cup along with mixed colistin., Disp: 600 mL, Rfl: 6  ???  syringe with needle (BD LUER-LOK SYRINGE) 3 mL 21 gauge x 1 Syrg, For use with inhaled antibiotic, Disp: 60 each, Rfl: 6  ???  tamsulosin (FLOMAX) 0.4 mg capsule, Take 1 capsule (0.4 mg total) by mouth daily as needed. For kidney stone, Disp: , Rfl:   ???  traZODone (DESYREL) 150 MG tablet, Take 1 tablet (150 mg total) by mouth nightly., Disp: , Rfl:     Allergies   Allergen Reactions   ??? Aztreonam Anaphylaxis, Hives, Nausea And Vomiting and Rash     fevers  Patient stated that he only vomited x1 with [place] in the past.????    fevers  Patient stated that he only vomited x1 with [place] in the past.????   ??? [place] [Aztreonam Lysine] Anaphylaxis   ??? Cefepime Itching, Nausea Only and Other (See Comments)     Headaches also   ??? Other Anaphylaxis and Other (See Comments)     Other reaction(s): Other (See Comments)  Bananas: itchy throat  Slo Bid record from [employer] states anaphylaxis.????Pt states this was from childhood and does not know reaction.  Bananas, causes itchy throat   ??? Slo-Bid 100 Anaphylaxis   ??? Tobramycin Hives and Tinnitus     From OSH record-documented as tinnitus but has received IV tobra with close monitoring.  Pt has received Tobramycin at [facility] since this allergy documented    From OSH record-documented as tinnitus but has received IV tobra with close monitoring.   ??? Banana Itching and Nausea And Vomiting       Family History   Problem Relation Age  of Onset   ??? Bipolar disorder Mother    ??? Depression Mother          Lab Results   Component Value Date    A1C 11.3 (H) 02/07/2022    A1C 10.6 (H) 11/03/2021    A1C 9.0 (H) 06/08/2021    A1C 10.0 (H) 03/09/2021       Wt Readings from Last 3 Encounters:   02/07/22 (!) 107 kg (235 lb 14.3 oz)   01/25/22 (!) 107.5 kg (237 lb)   01/25/22 (!) 107.5 kg (237 lb)       Lab Review    Lab Results   Component Value Date    NA 140 02/12/2022    K 3.8 02/12/2022    CL 105 02/12/2022    CO2 28.0 02/12/2022    BUN 7 (L) 02/12/2022    CREATININE 0.65 02/12/2022    GFRNONAA 114 01/18/2020    GFRAA 132 01/18/2020    CALCIUM 8.6 (L) 02/12/2022    ALBUMIN 3.6 02/12/2022    PHOS 3.9 02/06/2022       Lab Results   Component Value Date    CHOL 148 12/05/2020     Lab Results   Component Value Date    LDL 96 12/05/2020     Lab Results   Component Value Date    HDL 36 (L) 12/05/2020     Lab Results   Component Value Date    TRIG 80 12/05/2020     Lab Results   Component Value Date    ALT 53 (H) 02/12/2022     Lab Results   Component Value Date    TSH 2.120 11/18/2016     Lab Results   Component Value Date    CREATUR 189.2 06/20/2017     No results found for: ALBQTUR, ALBCRERATIO, ALBCRERAT  No results found for: Christa See, MSHCGMOM, MALBCRERAT  Lab Results   Component Value Date    Protein, Ur 8.6 06/20/2017    Protein/Creatinine Ratio, Urine 0.045 06/20/2017         Alexia Freestone, MSN, AGCNS-BC, APRN, CDCES

## 2022-03-21 NOTE — Unmapped (Unsigned)
Otolaryngology Clinic Note    Christian Young is a 36 y.o. male is seen in follow-up      History of Present Illness:     The patient is a 36 y.o. male who  has a past medical history of Anxiety, Asthma, Chronic pain disorder, Cystic fibrosis (CMS-HCC), Depression, Diabetes mellitus (CMS-HCC), Hypertension, Lumbar radiculopathy (10/26/2020), and Nonproductive cough (04/05/2018). who presented on 01/25/22 for the evaluation of sinonasal complaints.    Today he reports that he has previously had sinus surgery somewhere between 2014 and 2016. He endorses general sinus issues. He endorses sinus pressure, drainage, congestion, and ear pain. He denies change in sense of smell. His wife reports that he will often need antibiotics for sinus infections multiple times a year. He gets mild relief with antibiotics. He has not been on steroids because he has diabetes and his kidneys struggle. He reports near daily headaches. His symptoms have worsened over the past two years and he endorses seasonal symptoms as well. He uses Flonase daily, he takes Xyzal regularly. He denies previous allergy testing. He has been on Trikafta since 2020. He reports that for a long time the Trikafta gave him a lot of relief. He was septic in December and he feels like he has been trying to play catch up since then. He reports that his lung function is decent. His last hospitalization was in march and lasted 2 weeks. 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 He reports hearing loss, he was previously on Tobramycin but has become severely allergic and ceased. He has recently been on Ciprofloxacin for a kidney infection.     HX OF DVT's on Rivaroxaban 20 mg daily.     Update 03/22/2022:  The patient returns for follow-up. ***      Patients medical records were personally reviewed.      A 12 point review of systems was negative except as indicated.  The patient denies fevers, chills, shortness of breath, chest pain, nausea, vomiting, diarrhea, inability to lie flat, dysphagia, odynophagia, hemoptysis, hematemesis, changes in vision, changes in voice quality, otalgia, otorrhea, vertiginous symptoms, focal deficits, or other concerning symptoms.    Past Medical History     has a past medical history of Anxiety, Asthma, Chronic pain disorder, Cystic fibrosis (CMS-HCC), Depression, Diabetes mellitus (CMS-HCC), Hypertension, Lumbar radiculopathy (10/26/2020), and Nonproductive cough (04/05/2018).    Past Surgical History     has a past surgical history that includes pr removal of lung,lobectomy (Right, 03/29/2017); IR Insert Port Age Greater Than 5 Years (03/27/2019); Sinus surgery; pr nasal/sinus ndsc w/rmvl tiss from frontal sinus (Bilateral, 02/09/2022); pr nasal/sinus ndsc tot w/sphendt w/sphen tiss rmvl (Bilateral, 02/09/2022); pr stereotactic comp assist proc,cranial,extradural (Bilateral, 02/09/2022); pr repair of nasal septum (Midline, 02/09/2022); and pr remv upper jaw-maxillectomy (Bilateral, 02/09/2022).    Current Medications    Current Outpatient Medications   Medication Sig Dispense Refill   ??? acetaminophen (TYLENOL) 500 MG tablet Take 1 tablet (500 mg total) by mouth every six (6) hours as needed. 30 tablet 0   ??? albuterol (PROVENTIL HFA;VENTOLIN HFA) 90 mcg/actuation inhaler Inhale 2 puffs every six (6) hours as needed. 1 Inhaler 1   ??? albuterol 2.5 mg /3 mL (0.083 %) nebulizer solution Inhale 3 mL (2.5 mg total) by nebulization two (2) times a day. 540 mL 3   ??? alcohol swabs (ALCOHOL PREP PADS) PadM Use as directed with inhaled antibiotics 100 each 11   ??? amitriptyline (ELAVIL) 100 MG tablet TAKE ONE TABLET BY  MOUTH AT BEDTIME (Patient taking differently: Take 1 tablet (100 mg total) by mouth nightly.) 30 tablet 1   ??? atorvastatin (LIPITOR) 40 MG tablet Take 1 tablet (40 mg total) by mouth nightly.     ??? blood sugar diagnostic (GLUCOSE BLOOD) Strp by Other route Three (3) times a day with a meal. Rx sent to [facility] 01/04/20     ??? blood-glucose meter kit Dispense meter that is preferred by patient's insurance company 1 each 0   ??? blood-glucose meter,continuous (DEXCOM G6 RECEIVER) Misc Use as directed 1 each 0   ??? blood-glucose sensor (DEXCOM G6 SENSOR) Devi Use sq as directed every 10 days 9 each 11   ??? blood-glucose transmitter (DEXCOM G6 TRANSMITTER) Devi Use as directed, replace every 90 days 1 each 11   ??? budesonide-formoteroL (SYMBICORT) 160-4.5 mcg/actuation inhaler Inhale 2 puffs Two (2) times a day. 30.6 g 3   ??? colistimethate (COLYMYCIN) 150 mg injection Inhale the contents of 1 vial (150 mg total) Two (2) times a day. Dispense 56 vials for 28 day supply. 112 mL 6   ??? EASY TOUCH LANCING DEVICE Misc Use as directed.     ??? EASY TOUCH TWIST LANCETS 30 gauge Misc Use as directed.     ??? elexacaftor-tezacaftor-ivacaft (TRIKAFTA) 100-50-75 mg(d) /150 mg (n) tablet Take 2 Tablets (orange) by mouth in the morning and 1 tablet (blue) in the evening with fatty food 84 tablet 5   ??? empty container Misc Use as directed to dispose of syringes 1 each 0   ??? FLUoxetine 60 mg Tab Take 60 mg by mouth daily.      ??? fluticasone propionate (FLONASE) 50 mcg/actuation nasal spray 2 sprays into each nostril daily. (Patient taking differently: 1 spray into each nostril daily.)     ??? FREESTYLE LIBRE 14 DAY SENSOR kit Use 3-4 times daily     ??? gabapentin (NEURONTIN) 600 MG tablet Take 1 tablet (600 mg total) by mouth Three (3) times a day.     ??? glucose 4 GM chewable tablet Chew 4 tablets (16 g total) every ten (10) minutes as needed for low blood sugar ((For Blood Glucose LESS than 70 mg/dL and GREATER than or EQUAL to 54 mg/dL and able to take PO.)). 50 tablet 12   ??? insulin ASPART (NOVOLOG FLEXPEN) 100 unit/mL (3 mL) injection pen Inject 0.35 mL (35 Units total) under the skin Three (3) times a day before meals. - smaller than usual sized meal = 35 units                - usual sized meal = 40 units                - larger than usual sized meal = 45 units 40.5 mL 4   ??? insulin ASPART (NOVOLOG FLEXPEN) 100 unit/mL (3 mL) injection pen Inject 0.1 mL (10 Units total) under the skin Four (4) times a day (before meals and nightly). - If blood sugar 70-150, give 0 units.  - If blood sugar 151-200, give 2 units.  - If blood sugar 201-250, give 4 units. - If blood sugar 251-300, give 6 units. - If blood sugar 301-350, give 8 units. - If blood sugar >350, give 10 units and notify your endocrinologist. 12 mL 4   ??? insulin glargine (BASAGLAR, LANTUS) 100 unit/mL (3 mL) injection pen Inject 0.5 mL (50 Units total) under the skin nightly. 15 mL 3   ??? insulin pump cart,automated,BT (OMNIPOD  5 G6 PODS, GEN 5,) Crtg 1 each by subcutaneous (via wearable injector) route every three (3) days. Change every 72 hour 10 each 12   ??? lamoTRIgine (LAMICTAL) 200 MG tablet Take 2 tablets (400 mg total) by mouth daily.     ??? levocetirizine (XYZAL) 5 MG tablet Take 1 tablet (5 mg total) by mouth every evening. 90 tablet 3   ??? lipase-protease-amylase (CREON) 36,000-114,000- 180,000 unit CpDR Take 8 caps by mouth with meals three times daily and 4 caps with snacks up to three times a day. 1000 capsule 11   ??? lisinopriL (PRINIVIL,ZESTRIL) 10 MG tablet Take 1 tablet (10 mg total) by mouth daily. 30 tablet 0   ??? montelukast (SINGULAIR) 10 mg tablet Take 1 tablet (10 mg total) by mouth nightly. 90 tablet 3   ??? MVW COMPLETE FORMUL PROBIOTIC 40 billion cell -15 mg CpDR Take 1 capsule by mouth daily. (Patient taking differently: Take 1 capsule by mouth in the morning.) 90 capsule 3   ??? MVW Complete, pediatric multivit 61-D3-vit K, (MVW COMPLETE FORMULATION) 1,500-800 unit-mcg cap Take 2 capsules by mouth Two (2) times a day. (Patient taking differently: Take 2 capsules by mouth daily.)     ??? nebulizers (LC PLUS) Misc use as directed with nebulized medications twice daily. 4 each 4   ??? nebulizers (PARI LC D NEBULIZER) Misc Use with inhaled medication. 1 each 6   ??? nebulizers Misc Use as directed with inhaled medications 4 each 3   ??? omeprazole (PRILOSEC) 20 MG capsule Take 1 capsule (20 mg total) by mouth daily. 90 capsule 3   ??? pen needle, diabetic (BD ULTRA-FINE NANO PEN NEEDLE) 32 gauge x 5/32 (4 mm) Ndle use up to 4 times daily 400 each 12   ??? pen needle, diabetic 32 gauge x 5/32 (4 mm) Ndle Use with insulin up to 4 times/day as needed. 1 each 5   ??? pramipexole (MIRAPEX) 0.125 MG tablet Take 2 tablets (0.25 mg total) by mouth nightly.     ??? PULMOZYME 1 mg/mL nebulizer solution Inhale 2.5 mg daily. 225 mL 3   ??? rivaroxaban (XARELTO) 10 mg tablet Take 1 tablet (10 mg total) by mouth daily. 90 tablet 2   ??? sodium chloride 7% 7 % Nebu Inhale 4 mL by nebulization two (2) times a day.     ??? sterile water Soln Use 2 mL to mix colistin, then add another 1 mL SWFI into neb cup along with mixed colistin. 600 mL 6   ??? syringe with needle (BD LUER-LOK SYRINGE) 3 mL 21 gauge x 1 Syrg For use with inhaled antibiotic 60 each 6   ??? tamsulosin (FLOMAX) 0.4 mg capsule Take 1 capsule (0.4 mg total) by mouth daily as needed. For kidney stone     ??? traZODone (DESYREL) 150 MG tablet Take 1 tablet (150 mg total) by mouth nightly.       No current facility-administered medications for this visit.       Allergies    Allergies   Allergen Reactions   ??? Aztreonam Anaphylaxis, Hives, Nausea And Vomiting and Rash     fevers  Patient stated that he only vomited x1 with [place] in the past.????    fevers  Patient stated that he only vomited x1 with [place] in the past.????   ??? [place] [Aztreonam Lysine] Anaphylaxis   ??? Cefepime Itching, Nausea Only and Other (See Comments)     Headaches also   ??? Other Anaphylaxis and Other (  See Comments)     Other reaction(s): Other (See Comments)  Bananas: itchy throat  Slo Bid record from [employer] states anaphylaxis.????Pt states this was from childhood and does not know reaction.  Bananas, causes itchy throat   ??? Slo-Bid 100 Anaphylaxis   ??? Tobramycin Hives and Tinnitus     From OSH record-documented as tinnitus but has received IV tobra with close monitoring.  Pt has received Tobramycin at [facility] since this allergy documented    From OSH record-documented as tinnitus but has received IV tobra with close monitoring.   ??? Banana Itching and Nausea And Vomiting       Family History    Negative for bleeding disorders or free bleeding.     family history includes Bipolar disorder in his mother; Depression in his mother.    Social History:     reports that he has quit smoking. His smoking use included e-cigarettes. He has never used smokeless tobacco.   reports that he does not currently use alcohol after a past usage of about 3.0 standard drinks of alcohol per week.   reports no history of drug use.    Review of Systems    A 12 system review of systems was performed and is negative other than that noted in the history of present illness.    Vital Signs  There were no vitals taken for this visit.    SNOT-22 today was 74    Physical Exam    General: Well-developed, well-nourished. Appropriate, comfortable, and in no apparent distress.  Head/Face: On external examination there is no obvious asymmetry or scars. On palpation there is no tenderness over maxillary sinuses or masses within the salivary glands. Cranial nerves V and VII are intact through all distributions.  Eyes: PERRL, EOMI, the conjunctiva are not injected and sclera is non-icteric.  Ears: On external exam, there is no obvious lesions or asymmetry. The EACs are bilaterally without lesions. The TMs are in the neutral position and are mobile to pneumatic otoscopy bilaterally. There are no middle ear masses or fluid noted. Hearing is grossly intact bilaterally. Impacted cerumen removed bilaterally.   Nose: On external exam there are neither lesions nor asymmetry of the nasal tip/ dorsum. On anterior rhinoscopy, visualization posteriorly is limited on anterior examination. For this reason, to adequately evaluate posteriorly for masses, polypoid disease and/or signs of infections, nasal endoscopy is indicated (see procedure below).  Oral cavity/oropharynx: The mucosa of the lips, gums, hard and soft palate, posterior pharyngeal wall, tongue, floor of mouth, and buccal region are without masses or lesions and are normally hydrated. Good dentition. Tongue protrudes midline. Tonsils are normal appearing. Supraglottis not visualized due to gag reflex. Mildly enlarged tonsils.   Neck: There is no asymmetry or masses. Trachea is midline. There is no enlargement of the thyroid or palpable thyroid nodules.   Lymphatics: There is no palpable lymphadenopathy along the jugulodiagastric, submental, or posterior cervical chains.  Chest: No audible wheeze, unlabored respirations.  Cardiovascular: Regular rate.  GI: Nondistended.  Neurologic: Cranial nerve???s II-XII are grossly intact. Exam is non-focal.  Extremities: No cyanosis, clubbing or edema.    Procedures:  Diagnostic Bilateral Nasal Endoscopy performed on 03/22/2022 (CPT 31231)    NOTE: Nasal endoscopy is performed for the sinuses only, and not for examination of the skull base, septum or inferior turbinates, nor is it related to any previously performed septoplasty or inferior turbinate surgery or skull base surgery.     Surgeon: Egbert Garibaldi, MD  Anesthesia: none  Procedure Detail:  As a result of inability to visualize the intranasal anatomy, and after discussion of the potential risks related to the procedure (primarily bleeding), a endoscope is used to examine the left and right sinonasal cavities, including the interior of the nasal cavity and the middle and superior meatus, the turbinates, and the spheno-ethmoid recess. All these areas were inspected.    Findings:    Examination on the left reveals an intact nasal septum with no associated masses, lesions, or friable mucosa. Evidence of previous sinus surgery. Inferior turbinate hypertrophy. Evidence of previous surgery, maxillary is open with polypoid edema, partial ethmoid, middle turbinate still in place.    Examination on the right reveals an intact nasal septum with no associated masses, lesions, or friable mucosa. Evidence of previous sinus surgery. Inferior turbinate hypertrophy. Evidence of previous surgery, maxillary is open with polypoid edema, partial ethmoid, middle turbinate still in place.      Oretha Ellis Nasal Endoscopy Score: [name] is used to assess the degree of inflammation of the sinonasal structures, including the middle and superior turbinates, the ethmoid sinuses, maxillary sinuses, frontal sinuses, and sphenoid sinuses.  In the presence of previous surgery, some or all of these structures may be absent.    Left        ?? Polyps:  Absent (0)   ?? Edema:   Mild (1)   ?? Discharge:  Clear, Thin (1)    ?? Scarring:  Mild (1)   ?? Crusting:  Mild (1)      Total Left:  4     Right         ?? Polyps:  Absent (0)   ?? Edema:  Mild (1)   ?? Discharge: Clear, Thin (1)    ?? Scarring:  Mild (1)   ?? Crusting:  Mild (1)      Total Right:   4      Labs and Diagnostic Tests  None      Assessment:  The patient is a 36 y.o. male who  has a past medical history of Anxiety, Asthma, Chronic pain disorder, Cystic fibrosis (CMS-HCC), Depression, Diabetes mellitus (CMS-HCC), Hypertension, Lumbar radiculopathy (10/26/2020), and Nonproductive cough (04/05/2018). 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 Mr. Cowsert has inflammation in the nasal cavity noted on nasal endoscopy exam today in clinic. We have extensively discussed treatment options. The patient will start on a nasal hygiene regimen which includes BID isotoninc nasal irrigation and nasal steroid sprays (2 sprays BID each nostril). We have discussed the proper positioning for optimal use of the nasal steroid.     2. He has been on Tobramycin a few times so it is possible that he has hearing loss.He should get his Audiogram as scheduled later today.     3. Considering his headaches and scarring in his nose, I will order a CT for further visualization of his sinuses. It is likely that his frontal sinus is blocked off and may need surgical management. If he is admitted, it would be great to obtain a CT sinus - during the hospitalization. If admitted to [facility] would consider trying to add him on.  Would need to be of anticoagulation to stop perioperatively as this would significantly increase the risk of surgery.      Follow-up will be scheduled in 3-4 months  to assess the response to medical therapy. Should medical therapy fail we plan to discuss other treatment options.    The patient's physical examination findings including flexible fiberoptic nasopharyngolaryngosocpy/sinonasal endoscopy were thoroughly discussed.    The patient voiced complete understanding of plan as detailed above and is in full agreement.    ***

## 2022-03-22 ENCOUNTER — Ambulatory Visit
Admit: 2022-03-22 | Discharge: 2022-03-23 | Payer: PRIVATE HEALTH INSURANCE | Attending: Student in an Organized Health Care Education/Training Program | Primary: Student in an Organized Health Care Education/Training Program

## 2022-03-22 ENCOUNTER — Ambulatory Visit: Admit: 2022-03-22 | Discharge: 2022-03-23 | Payer: PRIVATE HEALTH INSURANCE

## 2022-03-26 NOTE — Unmapped (Unsigned)
Pulmonary Clinic - Follow-up Visit      ASSESSMENT     Christian Young is a 36 y.o. male with cystic fibrosis [id number] and [phone] insertion) who Young to clinic for routine CF follow-up.     He has had 4 hospitalizations since 01/2021 (one for COVID in 03/2021, one for CF exacerbation in 05/2021, one for cough and fatigue concerning for CF exacerbation in 07/2021, once after sinus infection symptoms in 08/2021, one post-Rhinovirus in 01/2022); hopefully recent sinus surgery during last admission will help symptoms    Our plan had been to get him started on colistin to reduce exacerbations, but insurance will not cover it, so we have been using it for acute exacerbations and during hospitalizations.    If he requires admission would plan to do IV zosyn and either oral quinolone (if not already treated with one) or inhaled colistin (if has been over month since last treatment with colistin).       Problem List Items Addressed This Visit    None      PLAN       Cystic fibrosis -   - Continue Trikafta - LFT's annually due 02/2023; IgE due 10/2022  - Continue home CF inhaled meds and airway clearance twice daily - increase airway clearance with increased resp symptoms/exacerbations  - previously on TOBI nebs, had rash with Tobramycin infusion and tinnitus with tobramycin infusion as well during last admin 08/2021 - in future will need to consider alternate therapy, for now if admitted plan to use IV zosyn in combination with oral quinolone vs inhaled colistin   - No trikafta fills since 11/22, per patient has excess supply at home - will need to bring home ETI supply during hospitalizations***    CF sinus disease  - referred to [facility] ENT last visit due to history of sinus disease   - s/p recent sinus surgery with Dr. Ralene Ok ***    Pancreatic insufficiency secondary to cystic fibrosis  - Continue Creon and MVW vitamins   - plan to check Vitamin A, D, E levels today    GERD  - PPI BID, monitor pattern of symptoms     CF related diabetes - Patient follows with [facility] for his CF-related diabetes - last A1c 11.6 on 08/18/21, using Freestyle Cable  - is on ACEi for hypertension as well (lisinopril)  - elevated LDL (103), TC 185, HDL low (83), TGL 254 (high) 08/18/21 at PCP office     Hypertension  - lisinopril 10 mg daily ***    At risk for CF bone disease  - last DEXA normal in 2018, repeat today 03/27/22    Mood disorder  - continue local follow up with psychiatry  - fluoxetine 60mg  daily, amitriptyline 100mg  daily, and lamotrigine 400mg  daily  - trazodone ***    Chronic back pain  - gabapentin 600 mg TID    History of DVT  - xarelto 20 mg daily    Healthcare maintenance:  Immunization History   Administered Date(s) Administered    COVID-19 VAC,BIVALENT(80YR UP),PFIZER 06/26/2021    COVID-19 VACC,MRNA,(PFIZER)(PF) 10/31/2019, 11/21/2019, 07/11/2020, 10/10/2020    Influenza Nasal, Unspecified Formulation 06/14/2020    Influenza Vaccine Quad (IIV4 PF) 6-40mo 06/26/2021    Influenza Vaccine Quad (IIV4 PF) 56mo+ injectable 09/06/2015, 04/10/2019    Influenza Vaccine Quad (IIV4 W/PRESERV) 66MO+ 05/22/2016, 04/18/2017, 06/20/2020    Influenza Virus Vaccine PF(cciiv3) 33yrs+ from ce 06/02/2015    Influenza Virus Vaccine, unspecified formulation 06/21/2016, 04/18/2017, 04/29/2018,  04/11/2019    PNEUMOCOCCAL POLYSACCHARIDE 23 02/18/2013, 04/11/2019    TdaP 01/12/2015   Thinks got flu shot at prevo drug  Thinks he got bivalent booster      The patient will return to clinic in 3 months to see Dr. [name], or sooner as needed. Patient discussed with Dr. Truett Mainland, MD  [facility] Pediatric Pulmonary Fellow  [facility] Adult Pulmonary and Critical Care Fellow  [phone]    HISTORY:     Interval History:  Diagnosed at age 71 years old with chronic infections, had consolidation and parapneumonic effusion,sent to [facility] for treatment since then.   [id number] and [id number] insertion. 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 Christian Young to clinic today for routine CF follow-up. Last seen in clinic by Durenda Hurt on 01/25/22 with increased resp symtpoms but stable PFTs, RPP was positive for rhinovirus. Started on cipro - subsequently required admission on 02/06/22 for worsening symptoms and treated for exacerbation x 10 days with zosyn and inhaled colistin. Underwent sinus surgery with Dr. Ralene Ok on 6/30.    We tried to order inhaled colistin to use as suppressive therapy given frequent exacerbations, but were not able to get insurance approval    Current respiratory symptoms  Current sinus symptoms    Reports compliance with his trikafta    For his sinuses, doing netti pot (flonase, claritin, singulair***) and for airway clearance doing vest with albuterol and 7% HTS along with pulmozyme daily      Otherwise, have a dog named Moose  ***Mood    GI and nutrition:   - Appetite   - Stools ***  - Heartburn *** -   - Reports compliance to his Creon. He takes 2 MVW D5000 vitamins twice a day and MVW probiotic. daily.   - Current weight is 234 lbs. Recent weight changes - has lost about 20 lbs over past 2 weeks - thinks he has been eating less due to changes in appetite, and also tends to lose weight when sick with exacerbation     Hypertension: On lisinopril 10 mg daily. Was on Coreg before, which was started in 2018 as an inpatient for sinus tachycardia. BP has been well controlled recently.     Diabetes: Followed and managed by endocrinology.  Reports compliance to his insulin. Sugars have been running a bit higher but he says this is typical when he has an exacerbation -  Now has a Therapist, art and it works much better for him than Dexcom.      Mental health issues: Following with local Psychiatrist. Mood has generally been good.     Past Medical History:   Diagnosis Date    Anxiety     Asthma     Chronic pain disorder     Cystic fibrosis (CMS-HCC)     Depression     Diabetes mellitus (CMS-HCC)     Hypertension     Lumbar radiculopathy 10/26/2020    Nonproductive cough 04/05/2018     Past Surgical History:   Procedure Laterality Date    IR INSERT PORT AGE GREATER THAN 5 YRS  03/27/2019    IR INSERT PORT AGE GREATER THAN 5 YRS 03/27/2019 Rush Barer, MD IMG VIR [facility]    PR NASAL/SINUS NDSC TOT W/SPHENDT W/SPHEN TISS RMVL Bilateral 02/09/2022    Procedure: NASAL/SINUS ENDOSCOPY, SURGICAL WITH ETHMOIDECTOMY; TOTAL (ANTERIOR AND POSTERIOR), INCLUDING SPHENOIDOTOMY, WITH REMOVAL OF TISSUE FROM THE SPHENOID SINUS;  Surgeon: Adam Swaziland Kimple,  MD;  Location: MAIN OR [place];  Service: ENT    PR NASAL/SINUS NDSC W/RMVL TISS FROM FRONTAL SINUS Bilateral 02/09/2022    Procedure: NASAL/SINUS ENDOSCOPY, SURGICAL, WITH FRONTAL SINUS EXPLORATION, INCLUDING REMOVAL OF TISSUE FROM FRONTAL SINUS, WHEN PERFORMED;  Surgeon: Adam Swaziland Kimple, MD;  Location: MAIN OR [place];  Service: ENT    PR REMOVAL OF LUNG,LOBECTOMY Right 03/29/2017    Procedure: REMOVAL OF LUNG, OTHER THAN PNEUMONECTOMY; SINGLE LOBE (LOBECTOMY);  Surgeon: Cherie Dark, MD;  Location: MAIN OR [facility];  Service: Thoracic    PR REMV UPPER JAW-MAXILLECTOMY Bilateral 02/09/2022    Procedure: MAXILLECTOMY; WO ORBITAL EXENTERATION;  Surgeon: Adam Swaziland Kimple, MD;  Location: MAIN OR [facility];  Service: ENT    PR REPAIR OF NASAL SEPTUM Midline 02/09/2022    Procedure: SEPTOPLASTY OR SUBMUCOUS RESECTION, WITH OR WITHOUT CARTILAGE SCORING, CONTOURING OR REPLACEMENT WITH GRAFT;  Surgeon: Adam Swaziland Kimple, MD;  Location: MAIN OR [facility];  Service: ENT    PR STEREOTACTIC COMP ASSIST PROC,CRANIAL,EXTRADURAL Bilateral 02/09/2022    Procedure: STEREOTACTIC COMPUTER-ASSISTED (NAVIGATIONAL) PROCEDURE; CRANIAL, EXTRADURAL;  Surgeon: Adam Swaziland Kimple, MD;  Location: MAIN OR [facility];  Service: ENT    SINUS SURGERY         Other History:  The social history and family history were personally reviewed and updated in the patient's electronic medical record.     Home Medications:  Current Outpatient Medications on File Prior to Visit   Medication Sig Dispense Refill    acetaminophen (TYLENOL) 500 MG tablet Take 1 tablet (500 mg total) by mouth every six (6) hours as needed. 30 tablet 0    albuterol (PROVENTIL HFA;VENTOLIN HFA) 90 mcg/actuation inhaler Inhale 2 puffs every six (6) hours as needed. 1 Inhaler 1    albuterol 2.5 mg /3 mL (0.083 %) nebulizer solution Inhale 3 mL (2.5 mg total) by nebulization two (2) times a day. 540 mL 3    alcohol swabs (ALCOHOL PREP PADS) PadM Use as directed with inhaled antibiotics 100 each 11    amitriptyline (ELAVIL) 100 MG tablet TAKE ONE TABLET BY MOUTH AT BEDTIME (Patient taking differently: Take 1 tablet (100 mg total) by mouth nightly.) 30 tablet 1    atorvastatin (LIPITOR) 40 MG tablet Take 1 tablet (40 mg total) by mouth nightly.      blood sugar diagnostic (GLUCOSE BLOOD) Strp by Other route Three (3) times a day with a meal. Rx sent to [facility] 01/04/20      blood-glucose meter kit Dispense meter that is preferred by patient's insurance company 1 each 0    blood-glucose meter,continuous (DEXCOM G6 RECEIVER) Misc Use as directed 1 each 0    blood-glucose sensor (DEXCOM G6 SENSOR) Devi Use sq as directed every 10 days 9 each 11    blood-glucose transmitter (DEXCOM G6 TRANSMITTER) Devi Use as directed, replace every 90 days 1 each 11    budesonide-formoteroL (SYMBICORT) 160-4.5 mcg/actuation inhaler Inhale 2 puffs Two (2) times a day. 30.6 g 3    colistimethate (COLYMYCIN) 150 mg injection Inhale the contents of 1 vial (150 mg total) Two (2) times a day. Dispense 56 vials for 28 day supply. 112 mL 6    EASY TOUCH LANCING DEVICE Misc Use as directed.      EASY TOUCH TWIST LANCETS 30 gauge Misc Use as directed.      elexacaftor-tezacaftor-ivacaft (TRIKAFTA) 100-50-75 mg(d) /150 mg (n) tablet Take 2 Tablets (orange) by mouth in the morning and 1 tablet (blue) in the evening  with fatty food 84 tablet 5    empty container Misc Use as directed to dispose of syringes 1 each 0    FLUoxetine 60 mg Tab Take 60 mg by mouth daily.       fluticasone propionate (FLONASE) 50 mcg/actuation nasal spray 2 sprays into each nostril daily. (Patient taking differently: 1 spray into each nostril daily.)      FREESTYLE LIBRE 14 DAY SENSOR kit Use 3-4 times daily      gabapentin (NEURONTIN) 600 MG tablet Take 1 tablet (600 mg total) by mouth Three (3) times a day.      glucose 4 GM chewable tablet Chew 4 tablets (16 g total) every ten (10) minutes as needed for low blood sugar ((For Blood Glucose LESS than 70 mg/dL and GREATER than or EQUAL to 54 mg/dL and able to take PO.)). 50 tablet 12    insulin ASPART (NOVOLOG FLEXPEN) 100 unit/mL (3 mL) injection pen Inject 0.35 mL (35 Units total) under the skin Three (3) times a day before meals. - smaller than usual sized meal = 35 units                - usual sized meal = 40 units                - larger than usual sized meal = 45 units 40.5 mL 4    insulin ASPART (NOVOLOG FLEXPEN) 100 unit/mL (3 mL) injection pen Inject 0.1 mL (10 Units total) under the skin Four (4) times a day (before meals and nightly). - If blood sugar 70-150, give 0 units.  - If blood sugar 151-200, give 2 units.  - If blood sugar 201-250, give 4 units. - If blood sugar 251-300, give 6 units. - If blood sugar 301-350, give 8 units. - If blood sugar >350, give 10 units and notify your endocrinologist. 12 mL 4    insulin glargine (BASAGLAR, LANTUS) 100 unit/mL (3 mL) injection pen Inject 0.5 mL (50 Units total) under the skin nightly. 15 mL 3    insulin pump cart,automated,BT (OMNIPOD 5 G6 PODS, GEN 5,) Crtg 1 each by subcutaneous (via wearable injector) route every three (3) days. Change every 72 hour 10 each 12    lamoTRIgine (LAMICTAL) 200 MG tablet Take 2 tablets (400 mg total) by mouth daily.      levocetirizine (XYZAL) 5 MG tablet Take 1 tablet (5 mg total) by mouth every evening. 90 tablet 3    lipase-protease-amylase (CREON) 36,000-114,000- 180,000 unit CpDR Take 8 caps by mouth with meals three times daily and 4 caps with snacks up to three times a day. 1000 capsule 11    lisinopriL (PRINIVIL,ZESTRIL) 10 MG tablet Take 1 tablet (10 mg total) by mouth daily. 30 tablet 0    montelukast (SINGULAIR) 10 mg tablet Take 1 tablet (10 mg total) by mouth nightly. 90 tablet 3    MVW COMPLETE FORMUL PROBIOTIC 40 billion cell -15 mg CpDR Take 1 capsule by mouth daily. (Patient taking differently: Take 1 capsule by mouth in the morning.) 90 capsule 3    MVW Complete, pediatric multivit 61-D3-vit K, (MVW COMPLETE FORMULATION) 1,500-800 unit-mcg cap Take 2 capsules by mouth Two (2) times a day. (Patient taking differently: Take 2 capsules by mouth daily.)      nebulizers (LC PLUS) Misc use as directed with nebulized medications twice daily. 4 each 4    nebulizers (PARI LC D NEBULIZER) Misc Use with inhaled medication. 1 each 6  nebulizers Misc Use as directed with inhaled medications 4 each 3    omeprazole (PRILOSEC) 20 MG capsule Take 1 capsule (20 mg total) by mouth daily. 90 capsule 3    pen needle, diabetic (BD ULTRA-FINE NANO PEN NEEDLE) 32 gauge x 5/32 (4 mm) Ndle use up to 4 times daily 400 each 12    pen needle, diabetic 32 gauge x 5/32 (4 mm) Ndle Use with insulin up to 4 times/day as needed. 1 each 5    pramipexole (MIRAPEX) 0.125 MG tablet Take 2 tablets (0.25 mg total) by mouth nightly.      PULMOZYME 1 mg/mL nebulizer solution Inhale 2.5 mg daily. 225 mL 3    rivaroxaban (XARELTO) 10 mg tablet Take 1 tablet (10 mg total) by mouth daily. 90 tablet 2    [EXPIRED] sod chlor-bicarb-squeez bottle (NEILMED) nasal packet 1 packet into each nostril two (2) times a day. 60 packet 0    sodium chloride 7% 7 % Nebu Inhale 4 mL by nebulization two (2) times a day.      sterile water Soln Use 2 mL to mix colistin, then add another 1 mL SWFI into neb cup along with mixed colistin. 600 mL 6    syringe with needle (BD LUER-LOK SYRINGE) 3 mL 21 gauge x 1 Syrg For use with inhaled antibiotic 60 each 6    tamsulosin (FLOMAX) 0.4 mg capsule Take 1 capsule (0.4 mg total) by mouth daily as needed. For kidney stone      traZODone (DESYREL) 150 MG tablet Take 1 tablet (150 mg total) by mouth nightly.       No current facility-administered medications on file prior to visit.        Allergies:  Allergies as of 03/27/2022 - Reviewed 02/09/2022   Allergen Reaction Noted    Aztreonam Anaphylaxis, Hives, Nausea And Vomiting, and Rash 09/06/2015    Baruch Goldmann lysine] Anaphylaxis 12/27/2016    Cefepime Itching, Nausea Only, and Other (See Comments) 09/06/2015    Other Anaphylaxis and Other (See Comments) 09/06/2015    Slo-bid 100 Anaphylaxis 06/20/2017    Tobramycin Hives and Tinnitus 09/06/2015    Banana Itching and Nausea And Vomiting 07/29/2016     Genetics:  [id number] and [id number] insertion  Modulator: Trikafta      Airway Clearance Regimen:  Albuterol  HTS 7% BID  Pulmozyme  Also on symbicort  Uses mainly vest     Inhaled ABX:  Off TOBI due to tinnitus/hearing loss      Hemoptysis:   No  ABPA:             No  Ptx:                  No  Sinusitus:       Yes - has had sinus surgery in the past, most recently 02/09/22     Panc Insuf:     Yes  PEG:                No  DIOS:              In the past -blockage that put in local hospital - was able to do laxatives didn't need an NG tube ([facility] in [place])  CF Liver Dz:   No - did have mild LFT bump during admission 07/2021     Diabetes:        Yes - follows w/ endocrine  Osteopenia:   N/A  Depression:   Yes     Other Co-morbidities:  Herniated disc with lumbar radiculopathy s/p surgery     Social Setting:  Lives with wife in [place]  Not currently working   Used to drink more, but has stopped drinking all together after 07/2021 hospitalization  Does use a fair amount of tyenol -   Does dip occasionally  No vaping or e-cigs  Have a dog named moose    Exacerbation History:   01/2022 - hospitalization at [facility], sinus surgery: levaquin outpatient --> zosyn x10 days + inh colistin  09/2021  07/2021 - hospitalization at [facility],   03/2021 - COVID, treated for CF exacerbation x 8 days and also received remdesivir, but recovered quickly so ultimately attributed more to COVID rather than CF and discharged     Micro History:  MSSA  - 06/08/21, pan S    Mucoid PsA  - 01/25/22    Smooth PsA  - 01/25/22  S: Cipro, Levo, Tobra  I: Aztreo, Cefe, Ceftaz, PT, LVX     Stenotrophamonas  R: Ceftat    MABSC - s/p RLL resection  - Assumed S to erythro from [facility]  06/2017: MASBC not present, but M gordonae is       Review of Systems:  A comprehensive review of systems was completed and negative except as noted in HPI.    PHYSICAL EXAM:     There were no vitals filed for this visit. as this was a virtual visit   There is no height or weight on file to calculate BMI.  Wt Readings from Last 3 Encounters:   02/07/22 (!) 107 kg (235 lb 14.3 oz)   01/25/22 (!) 107.5 kg (237 lb)   01/25/22 (!) 107.5 kg (237 lb)     GEN: Cooperative male, NAD, green hair  HEENT: NCAT, EOMI, sclerae anicteric, MMM  HEART/CV: appears well perfused  LUNGS/RESP: normal WOB on RA  ABD/GI:   EXT: No cyanosis  SKIN: No rashes or lesions on visible skin  NEURO: No focal deficits noted  PSYCH: Awake, alert, and interactive. Mood and affect appropriate.     LABORATORY and RADIOLOGY DATA:     Pulmonary Function Tests:    08/01/21          Interpretation: Spirometry demonstrates mild obstruction and is unchanged from prior test.     Pertinent Laboratory Data:  BMI:   There is no height or weight on file to calculate BMI.    Last Sputum Cx: 06/08/21 - 2+ MSSA (pan-S), <1+ Mucoid Pseudomonas (R imipenem, I cipro, levofloxacin, meropenem, S aztreonam, ceftaz, pip-tazo)  Last AFB:  06/08/21 - smear negative, culture negative     DEXA Scan:  03/04/17  Result: normal  Last OGTT:  N/a CFRD    CF Sputum Culture   Date Value Ref Range Status   01/25/2022 <1+ Mucoid Pseudomonas aeruginosa (A)  Final     Comment:     Susceptibility Testing By Consultation Only   01/25/2022 4+ Oropharyngeal Flora Isolated  Final   01/25/2022 <1+ Smooth Pseudomonas aeruginosa (A)  Final     Comment:     Susceptibility Testing By Consultation Only        AFB Smear   Date Value Ref Range Status   01/25/2022   Final    NO ACID FAST BACILLI SEEN- 3 negative smears do not exclude pulmonary TB. If active pulmonary TB is suspected, continue airborne isolation until pulmonary disease is excluded by negative cultures.  AFB Culture   Date Value Ref Range Status   01/25/2022 No Acid Fast Bacilli Detected  Final        IgE, Total   Date Value Ref Range Status   11/04/2021 18.2 2-214 IU/mL IU/mL Final        Total Bilirubin   Date Value Ref Range Status   02/12/2022 0.3 0.3 - 1.2 mg/dL Final     AST   Date Value Ref Range Status   02/12/2022 27 <=34 U/L Final     ALT   Date Value Ref Range Status   02/12/2022 53 (H) 10 - 49 U/L Final     Alkaline Phosphatase   Date Value Ref Range Status   02/12/2022 80 46 - 116 U/L Final        INR   Date Value Ref Range Status   02/07/2022 1.06  Final        Hemoglobin A1C   Date Value Ref Range Status   02/07/2022 11.3 (H) 4.8 - 5.6 % Final     Estimated Average Glucose   Date Value Ref Range Status   02/07/2022 278 mg/dL Final        Vitamin A   Date Value Ref Range Status   11/15/2021 40.8 32.5 - 78.0 mcg/dL Final     Comment:        -------------------ADDITIONAL INFORMATION-------------------  This test was developed and its performance characteristics   determined by [facility] in a manner consistent with CLIA   requirements. This test has not been cleared or approved by   the U.S. Food and Drug Administration.     Vitamin E   Date Value Ref Range Status   11/15/2021 10.0 5.5 - 17.0 mg/L Final     Comment:        -------------------ADDITIONAL INFORMATION-------------------  This test was developed and its performance characteristics   determined by [facility] in a manner consistent with CLIA   requirements. This test has not been cleared or approved by   the U.S. Food and Drug Administration.     Test Performed by:  [facility]  [address] [place], [place], [place] [address]  Lab Director: Paul Dykes M.D. Ph.D.; CLIA# [id number]     Vitamin D Total (25OH)   Date Value Ref Range Status   11/15/2021 31.1 20.0 - 80.0 ng/mL Final        CT Chest 06/24/18  FINDINGS:   Right lower lobe and inferior lingular bronchiectasis. Right lower lobe peripheral mucoid impacted airways and tree-in-bud nodular opacities unchanged. Gas trapping present all lung lobes, but most severe in the right lower lobe.      Right upper lobectomy with stable postoperative changes. No consolidation.      No pleural effusion.     Right-sided Port-A-Cath terminates in the right atrium. Heart size normal. No pericardial effusion. Normal caliber thoracic aorta. No mediastinal lymphadenopathy.     Bilateral gynecomastia. Hepatic steatosis. Osseous structures unremarkable.     IMPRESSION:     Chronic small airways disease including gas trapping and peripheral mucoid impacted airways unchanged compared 12/29/2017

## 2022-03-27 ENCOUNTER — Ambulatory Visit: Admit: 2022-03-27 | Payer: PRIVATE HEALTH INSURANCE

## 2022-03-27 NOTE — Unmapped (Signed)
[facility] has made a third and final attempt to reach this patient to refill the following medication:colistimethate 150 mg injection (COLYMYCIN).      We have left voicemails on the following phone numbers: [phone] and have sent a Mychart questionnaire..    Dates contacted: 08/03    08/10   08/15  Last scheduled delivery: 01/26/22    The patient may be at risk of non-compliance with this medication. The patient should call the [facility] at [phone]  Option 4, then Option 2 (all other specialty patients) to refill medication.    Yvonna Brun' W [place]   [facility]

## 2022-03-27 NOTE — Unmapped (Addendum)
Reached out to [place] to determine if he is coming to clinic today as he had missed his DEXA (scheduled 1130, PFTS @ 1240). Left voicemail. MyChart message sent.     [name], RN  CF Nurse Coordinator   [phone]

## 2022-04-19 NOTE — Unmapped (Signed)
Specialty Medication(s): Colistin    Mr.Rosado has been dis-enrolled from the [facility] specialty pharmacy services due to  unable to bill Colistin through pharmacy benefit per insurance restrictions. Unable to bill through [facility] since it only covers copayments .    Additional information provided to the patient: N/A - will message provider of this change.     Oliva Bustard  [facility] Specialty Pharmacist

## 2022-05-10 ENCOUNTER — Ambulatory Visit
Admit: 2022-05-10 | Discharge: 2022-05-11 | Payer: PRIVATE HEALTH INSURANCE | Attending: Student in an Organized Health Care Education/Training Program | Primary: Student in an Organized Health Care Education/Training Program

## 2022-05-25 MED ORDER — BUDESONIDE-FORMOTEROL HFA 160 MCG-4.5 MCG/ACTUATION AEROSOL INHALER
Freq: Two times a day (BID) | RESPIRATORY_TRACT | 3 refills | 92 days | Status: CP
Start: 2022-05-25 — End: 2023-05-25

## 2022-06-25 ENCOUNTER — Ambulatory Visit: Admit: 2022-06-25 | Payer: PRIVATE HEALTH INSURANCE

## 2022-07-10 DIAGNOSIS — J324 Chronic pansinusitis: Principal | ICD-10-CM

## 2022-07-10 MED ORDER — MONTELUKAST 10 MG TABLET
ORAL_TABLET | Freq: Every evening | ORAL | 0 refills | 90 days | Status: CP
Start: 2022-07-10 — End: ?

## 2023-02-12 MED ORDER — TRIKAFTA 100-50-75 MG (D)/150 MG (N) TABLETS
ORAL_TABLET | 5 refills | 0 days
Start: 2023-02-12 — End: ?

## 2023-02-13 MED ORDER — TRIKAFTA 100-50-75 MG (D)/150 MG (N) TABLETS
ORAL_TABLET | 5 refills | 0 days
Start: 2023-02-13 — End: ?
